# Patient Record
Sex: Male | Born: 1935 | Race: White | Hispanic: No | Marital: Married | Smoking: Never smoker
Health system: Southern US, Community
[De-identification: ages and names within clinical notes are randomized; demographics above are authoritative.]

## PROBLEM LIST (undated history)

## (undated) DIAGNOSIS — A770 Spotted fever due to Rickettsia rickettsii: Secondary | ICD-10-CM

## (undated) DIAGNOSIS — I472 Ventricular tachycardia: Secondary | ICD-10-CM

## (undated) DIAGNOSIS — G2581 Restless legs syndrome: Secondary | ICD-10-CM

## (undated) DIAGNOSIS — M109 Gout, unspecified: Secondary | ICD-10-CM

## (undated) DIAGNOSIS — G8929 Other chronic pain: Secondary | ICD-10-CM

## (undated) DIAGNOSIS — K449 Diaphragmatic hernia without obstruction or gangrene: Secondary | ICD-10-CM

## (undated) DIAGNOSIS — I10 Essential (primary) hypertension: Secondary | ICD-10-CM

## (undated) DIAGNOSIS — Z950 Presence of cardiac pacemaker: Secondary | ICD-10-CM

## (undated) DIAGNOSIS — R339 Retention of urine, unspecified: Secondary | ICD-10-CM

## (undated) DIAGNOSIS — R0600 Dyspnea, unspecified: Secondary | ICD-10-CM

## (undated) DIAGNOSIS — M1990 Unspecified osteoarthritis, unspecified site: Secondary | ICD-10-CM

## (undated) DIAGNOSIS — K219 Gastro-esophageal reflux disease without esophagitis: Secondary | ICD-10-CM

## (undated) DIAGNOSIS — I219 Acute myocardial infarction, unspecified: Secondary | ICD-10-CM

## (undated) DIAGNOSIS — R001 Bradycardia, unspecified: Secondary | ICD-10-CM

## (undated) DIAGNOSIS — I6340 Cerebral infarction due to embolism of unspecified cerebral artery: Secondary | ICD-10-CM

## (undated) DIAGNOSIS — I5040 Unspecified combined systolic (congestive) and diastolic (congestive) heart failure: Secondary | ICD-10-CM

## (undated) DIAGNOSIS — Z951 Presence of aortocoronary bypass graft: Secondary | ICD-10-CM

## (undated) DIAGNOSIS — E039 Hypothyroidism, unspecified: Secondary | ICD-10-CM

## (undated) DIAGNOSIS — I350 Nonrheumatic aortic (valve) stenosis: Secondary | ICD-10-CM

## (undated) DIAGNOSIS — R29898 Other symptoms and signs involving the musculoskeletal system: Secondary | ICD-10-CM

## (undated) DIAGNOSIS — J101 Influenza due to other identified influenza virus with other respiratory manifestations: Secondary | ICD-10-CM

## (undated) DIAGNOSIS — C4359 Malignant melanoma of other part of trunk: Secondary | ICD-10-CM

## (undated) DIAGNOSIS — R0602 Shortness of breath: Secondary | ICD-10-CM

## (undated) DIAGNOSIS — I4510 Unspecified right bundle-branch block: Secondary | ICD-10-CM

## (undated) DIAGNOSIS — M810 Age-related osteoporosis without current pathological fracture: Secondary | ICD-10-CM

## (undated) DIAGNOSIS — I495 Sick sinus syndrome: Secondary | ICD-10-CM

## (undated) DIAGNOSIS — M25569 Pain in unspecified knee: Secondary | ICD-10-CM

## (undated) DIAGNOSIS — I255 Ischemic cardiomyopathy: Secondary | ICD-10-CM

## (undated) DIAGNOSIS — R011 Cardiac murmur, unspecified: Secondary | ICD-10-CM

## (undated) DIAGNOSIS — E785 Hyperlipidemia, unspecified: Secondary | ICD-10-CM

## (undated) DIAGNOSIS — R42 Dizziness and giddiness: Secondary | ICD-10-CM

## (undated) DIAGNOSIS — F418 Other specified anxiety disorders: Secondary | ICD-10-CM

## (undated) DIAGNOSIS — G4700 Insomnia, unspecified: Secondary | ICD-10-CM

## (undated) DIAGNOSIS — I2510 Atherosclerotic heart disease of native coronary artery without angina pectoris: Secondary | ICD-10-CM

## (undated) DIAGNOSIS — I509 Heart failure, unspecified: Secondary | ICD-10-CM

## (undated) DIAGNOSIS — I951 Orthostatic hypotension: Secondary | ICD-10-CM

## (undated) HISTORY — DX: Cerebral infarction due to embolism of unspecified cerebral artery: I63.40

## (undated) HISTORY — DX: Sick sinus syndrome: I49.5

## (undated) HISTORY — DX: Retention of urine, unspecified: R33.9

## (undated) HISTORY — DX: Other symptoms and signs involving the musculoskeletal system: R29.898

## (undated) HISTORY — DX: Influenza due to other identified influenza virus with other respiratory manifestations: J10.1

## (undated) HISTORY — DX: Gout, unspecified: M10.9

## (undated) HISTORY — DX: Other chronic pain: G89.29

## (undated) HISTORY — DX: Nonrheumatic aortic (valve) stenosis: I35.0

## (undated) HISTORY — DX: Restless legs syndrome: G25.81

## (undated) HISTORY — DX: Pain in unspecified knee: M25.569

## (undated) HISTORY — DX: Dizziness and giddiness: R42

## (undated) HISTORY — DX: Orthostatic hypotension: I95.1

## (undated) HISTORY — PX: MELANOMA EXCISION: SHX5266

## (undated) HISTORY — DX: Unspecified right bundle-branch block: I45.10

## (undated) HISTORY — DX: Unspecified combined systolic (congestive) and diastolic (congestive) heart failure: I50.40

## (undated) HISTORY — DX: Ventricular tachycardia: I47.2

## (undated) HISTORY — DX: Essential (primary) hypertension: I10

## (undated) HISTORY — DX: Other specified anxiety disorders: F41.8

## (undated) HISTORY — DX: Age-related osteoporosis without current pathological fracture: M81.0

## (undated) HISTORY — DX: Dyspnea, unspecified: R06.00

## (undated) HISTORY — DX: Ischemic cardiomyopathy: I25.5

## (undated) HISTORY — DX: Spotted fever due to Rickettsia rickettsii: A77.0

## (undated) HISTORY — DX: Heart failure, unspecified: I50.9

---
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
## 1974-02-04 DIAGNOSIS — C4359 Malignant melanoma of other part of trunk: Secondary | ICD-10-CM

## 1974-02-04 HISTORY — DX: Malignant melanoma of other part of trunk: C43.59

## 1984-02-05 HISTORY — PX: TRANSURETHRAL RESECTION OF PROSTATE: SHX73

## 1994-02-04 HISTORY — PX: CORONARY ARTERY BYPASS GRAFT: SHX141

## 1997-10-04 ENCOUNTER — Ambulatory Visit (HOSPITAL_COMMUNITY): Admission: RE | Admit: 1997-10-04 | Discharge: 1997-10-04 | Payer: Self-pay | Admitting: Gastroenterology

## 1998-04-03 ENCOUNTER — Ambulatory Visit (HOSPITAL_COMMUNITY): Admission: RE | Admit: 1998-04-03 | Discharge: 1998-04-03 | Payer: Self-pay | Admitting: Cardiology

## 1999-10-31 ENCOUNTER — Encounter: Payer: Self-pay | Admitting: Internal Medicine

## 1999-10-31 ENCOUNTER — Ambulatory Visit (HOSPITAL_COMMUNITY): Admission: RE | Admit: 1999-10-31 | Discharge: 1999-10-31 | Payer: Self-pay | Admitting: Internal Medicine

## 2001-04-06 ENCOUNTER — Encounter (INDEPENDENT_AMBULATORY_CARE_PROVIDER_SITE_OTHER): Payer: Self-pay | Admitting: *Deleted

## 2001-04-06 ENCOUNTER — Ambulatory Visit (HOSPITAL_BASED_OUTPATIENT_CLINIC_OR_DEPARTMENT_OTHER): Admission: RE | Admit: 2001-04-06 | Discharge: 2001-04-06 | Payer: Self-pay | Admitting: Plastic Surgery

## 2001-07-12 ENCOUNTER — Inpatient Hospital Stay (HOSPITAL_COMMUNITY): Admission: EM | Admit: 2001-07-12 | Discharge: 2001-07-15 | Payer: Self-pay | Admitting: *Deleted

## 2001-07-13 ENCOUNTER — Encounter (HOSPITAL_BASED_OUTPATIENT_CLINIC_OR_DEPARTMENT_OTHER): Payer: Self-pay | Admitting: Internal Medicine

## 2001-07-14 ENCOUNTER — Encounter: Payer: Self-pay | Admitting: Ophthalmology

## 2001-07-14 ENCOUNTER — Encounter: Payer: Self-pay | Admitting: Cardiology

## 2001-11-20 ENCOUNTER — Ambulatory Visit (HOSPITAL_COMMUNITY): Admission: RE | Admit: 2001-11-20 | Discharge: 2001-11-20 | Payer: Self-pay | Admitting: *Deleted

## 2002-07-28 ENCOUNTER — Encounter: Admission: RE | Admit: 2002-07-28 | Discharge: 2002-07-28 | Payer: Self-pay | Admitting: Orthopedic Surgery

## 2002-07-28 ENCOUNTER — Encounter: Payer: Self-pay | Admitting: Orthopedic Surgery

## 2002-07-29 ENCOUNTER — Encounter (INDEPENDENT_AMBULATORY_CARE_PROVIDER_SITE_OTHER): Payer: Self-pay | Admitting: *Deleted

## 2002-07-29 ENCOUNTER — Ambulatory Visit (HOSPITAL_BASED_OUTPATIENT_CLINIC_OR_DEPARTMENT_OTHER): Admission: RE | Admit: 2002-07-29 | Discharge: 2002-07-29 | Payer: Self-pay | Admitting: Orthopedic Surgery

## 2003-10-21 ENCOUNTER — Emergency Department (HOSPITAL_COMMUNITY): Admission: EM | Admit: 2003-10-21 | Discharge: 2003-10-21 | Payer: Self-pay | Admitting: Emergency Medicine

## 2003-11-02 ENCOUNTER — Encounter: Admission: RE | Admit: 2003-11-02 | Discharge: 2003-11-02 | Payer: Self-pay | Admitting: Internal Medicine

## 2004-01-06 ENCOUNTER — Encounter: Admission: RE | Admit: 2004-01-06 | Discharge: 2004-01-06 | Payer: Self-pay | Admitting: Surgery

## 2004-06-15 ENCOUNTER — Encounter: Admission: RE | Admit: 2004-06-15 | Discharge: 2004-06-15 | Payer: Self-pay | Admitting: Cardiology

## 2004-10-25 ENCOUNTER — Encounter: Admission: RE | Admit: 2004-10-25 | Discharge: 2004-10-25 | Payer: Self-pay | Admitting: Internal Medicine

## 2005-10-08 ENCOUNTER — Encounter: Admission: RE | Admit: 2005-10-08 | Discharge: 2005-11-29 | Payer: Self-pay | Admitting: Internal Medicine

## 2006-07-26 ENCOUNTER — Encounter: Admission: RE | Admit: 2006-07-26 | Discharge: 2006-07-26 | Payer: Self-pay | Admitting: Internal Medicine

## 2007-03-27 ENCOUNTER — Ambulatory Visit (HOSPITAL_COMMUNITY): Admission: RE | Admit: 2007-03-27 | Discharge: 2007-03-27 | Payer: Self-pay | Admitting: *Deleted

## 2007-12-25 ENCOUNTER — Ambulatory Visit (HOSPITAL_COMMUNITY): Admission: RE | Admit: 2007-12-25 | Discharge: 2007-12-25 | Payer: Self-pay | Admitting: *Deleted

## 2008-02-05 HISTORY — PX: INSERT / REPLACE / REMOVE PACEMAKER: SUR710

## 2008-04-04 ENCOUNTER — Encounter: Admission: RE | Admit: 2008-04-04 | Discharge: 2008-04-04 | Payer: Self-pay | Admitting: Internal Medicine

## 2008-04-12 ENCOUNTER — Inpatient Hospital Stay (HOSPITAL_COMMUNITY): Admission: AD | Admit: 2008-04-12 | Discharge: 2008-04-13 | Payer: Self-pay | Admitting: Cardiology

## 2009-07-05 HISTORY — PX: CORONARY ANGIOPLASTY WITH STENT PLACEMENT: SHX49

## 2009-07-10 ENCOUNTER — Encounter: Payer: Self-pay | Admitting: Emergency Medicine

## 2009-07-10 ENCOUNTER — Inpatient Hospital Stay (HOSPITAL_COMMUNITY): Admission: EM | Admit: 2009-07-10 | Discharge: 2009-07-13 | Payer: Self-pay | Admitting: Cardiovascular Disease

## 2009-07-11 HISTORY — PX: US ECHOCARDIOGRAPHY: HXRAD669

## 2009-09-07 ENCOUNTER — Encounter (HOSPITAL_COMMUNITY): Admission: RE | Admit: 2009-09-07 | Discharge: 2009-11-03 | Payer: Self-pay | Admitting: Cardiology

## 2009-09-19 ENCOUNTER — Inpatient Hospital Stay (HOSPITAL_COMMUNITY): Admission: EM | Admit: 2009-09-19 | Discharge: 2009-09-20 | Payer: Self-pay | Admitting: Emergency Medicine

## 2010-02-04 HISTORY — PX: CATARACT EXTRACTION W/ INTRAOCULAR LENS  IMPLANT, BILATERAL: SHX1307

## 2010-02-25 ENCOUNTER — Encounter: Payer: Self-pay | Admitting: Cardiology

## 2010-04-05 ENCOUNTER — Other Ambulatory Visit: Payer: Self-pay | Admitting: Cardiology

## 2010-04-05 ENCOUNTER — Ambulatory Visit
Admission: RE | Admit: 2010-04-05 | Discharge: 2010-04-05 | Disposition: A | Discharge status: Home / Self Care | Payer: Medicare Other | Source: Ambulatory Visit | Attending: Cardiology | Admitting: Cardiology

## 2010-04-05 DIAGNOSIS — R42 Dizziness and giddiness: Secondary | ICD-10-CM

## 2010-04-05 HISTORY — PX: CARDIAC CATHETERIZATION: SHX172

## 2010-04-12 ENCOUNTER — Ambulatory Visit (HOSPITAL_COMMUNITY)
Admission: RE | Admit: 2010-04-12 | Discharge: 2010-04-12 | Disposition: A | Discharge status: Home / Self Care | Payer: Medicare Other | Source: Ambulatory Visit | Attending: Cardiology | Admitting: Cardiology

## 2010-04-12 DIAGNOSIS — Z950 Presence of cardiac pacemaker: Secondary | ICD-10-CM | POA: Insufficient documentation

## 2010-04-12 DIAGNOSIS — I2510 Atherosclerotic heart disease of native coronary artery without angina pectoris: Secondary | ICD-10-CM | POA: Insufficient documentation

## 2010-04-12 DIAGNOSIS — I25810 Atherosclerosis of coronary artery bypass graft(s) without angina pectoris: Secondary | ICD-10-CM | POA: Insufficient documentation

## 2010-04-12 DIAGNOSIS — Z7902 Long term (current) use of antithrombotics/antiplatelets: Secondary | ICD-10-CM | POA: Insufficient documentation

## 2010-04-12 DIAGNOSIS — E785 Hyperlipidemia, unspecified: Secondary | ICD-10-CM | POA: Insufficient documentation

## 2010-04-12 DIAGNOSIS — I252 Old myocardial infarction: Secondary | ICD-10-CM | POA: Insufficient documentation

## 2010-04-12 DIAGNOSIS — Z7982 Long term (current) use of aspirin: Secondary | ICD-10-CM | POA: Insufficient documentation

## 2010-04-12 LAB — POCT ACTIVATED CLOTTING TIME: Activated Clotting Time: 152 seconds

## 2010-04-13 LAB — POCT ACTIVATED CLOTTING TIME: Activated Clotting Time: 152 seconds

## 2010-04-17 NOTE — Procedures (Signed)
Adam [name], Adam [name]                 ACCOUNT NO.:  [id number]  MEDICAL RECORD NO.:  [id number]           PATIENT TYPE:  O  LOCATION:  [facility]                         FACILITY:  [facility]  PHYSICIAN:  Ritta Slot, MD     DATE OF BIRTH:  [birth year]  DATE OF PROCEDURE: DATE OF DISCHARGE:  04/12/2010                           CARDIAC CATHETERIZATION   PROCEDURE:  Left heart catheterization.  This is a very pleasant 77 year old gentleman who has a history of CAD status post coronary bypass grafting in 1996 with LIMA to LAD, SVG to the RCA, and SVG to OM.  He had bilateral stenting with a 3.5 x 28 mm stent to the SVG and to the RCA in June 2011.  He remained well from cardiovascular standpoint, although he was having symptoms of dizziness and chest discomfort.  He was therefore brought to [facility] for further evaluation of coronary artery disease and progression of his CAD.  After informed consent, he was placed in supine position.  His left wrist was prepped and draped in usual sterile fashion, as was his right groin.  Through the left wrist, using 1 mL of lidocaine, a 5-French sheath was placed in left radial artery using modified Seldinger technique without difficulty.  The JR-4 catheter was then used over an exchange wire to try to engage into the native right coronary which was unsuccessful.  We then went ahead and exchanged from multipurpose which was unsuccessful and then we tried RCB catheter which was unsuccessful and then the no-torque which was able to engage into the native right without difficulty.  The native was exchanged over J-wire, an exchange wire for the LIMA catheter, LIMA catheter what then exchanged into LIMA without difficulty.  The LIMA catheter was then exchanged over exchange wire for the JL-4 catheter.  The JL-4 catheter was used to engage the left main without difficulty.  An attempt was then made once again with a no-torque  engaged into the SVG.  Venous bypass grafts was unsuccessful.  An attempt through the left radial artery was then abandoned and then attention was turned to the right femoral artery.  After 10 mL of 1% lidocaine, we infiltrated the right groin using modified Seldinger technique, a 5-French sheath was placed into the right femoral artery without difficulty.  The JR-4 catheter was then passed through the 5-French sheath into the ostium of the SVG to the RCA and OM without difficulty.  Visualization of all grafts was complete.  CONCLUSION AND FINDINGS: 1. AO pressure 97/56. 2. Coronary.  The native right coronary was 100% occluded in its     origin and in its proximal portion.  The left main bifurcated into     LAD and circumflex.  Left main was patent.  The left circumflex was     100% occluded in its origin and in its proximal portion as was the     LAD.  Bypass graft to the LIMA, to the LAD was patent.  There was     retrograde flowing from the distal LAD to proximal LAD through the  LIMA without difficulty.  There is no significant disease.  The SVG     to the RCA was patent and mildly distended in its midportion of the     body was patent.  It caused retrograde filling of the native RCA     branch with proximal portion above the 50% stenosis of the native     artery prior to the bifurcation of PLA and PDA.  Saphenous vein     graft to OM was patent.  The OM itself was patent all the way     through.  There was no significant disease.  FINAL CONCLUSION:  There is patent LIMA to LAD, patent SVG to OM, patent SVG to RCA, native vessel 100% occluded, LAD 100% occluded, circumflex 100% occluded, and 50% distal RCA.  PLAN:  Medical therapy.  There is nothing to intervene upon.  He has low risk coronary anatomy and he will otherwise do well.     Ritta Slot, MD     HS/MEDQ  D:  04/12/2010  T:  04/13/2010  Job:  [id number]  Electronically Signed by Ritta Slot MD on  04/17/2010 01:07:24 PM

## 2010-04-19 LAB — PROTIME-INR
INR: 0.94 (ref 0.00–1.49)
Prothrombin Time: 12.8 seconds (ref 11.6–15.2)

## 2010-04-19 LAB — CBC
HCT: 37.3 % — ABNORMAL LOW (ref 39.0–52.0)
HCT: 37.6 % — ABNORMAL LOW (ref 39.0–52.0)
HCT: 38 % — ABNORMAL LOW (ref 39.0–52.0)
HCT: 39.3 % (ref 39.0–52.0)
Hemoglobin: 11.9 g/dL — ABNORMAL LOW (ref 13.0–17.0)
Hemoglobin: 12.5 g/dL — ABNORMAL LOW (ref 13.0–17.0)
Hemoglobin: 12.6 g/dL — ABNORMAL LOW (ref 13.0–17.0)
Hemoglobin: 12.9 g/dL — ABNORMAL LOW (ref 13.0–17.0)
MCH: 30.3 pg (ref 26.0–34.0)
MCH: 31.2 pg (ref 26.0–34.0)
MCH: 31.4 pg (ref 26.0–34.0)
MCH: 31.5 pg (ref 26.0–34.0)
MCHC: 31.9 g/dL (ref 30.0–36.0)
MCHC: 32.8 g/dL (ref 30.0–36.0)
MCHC: 33.2 g/dL (ref 30.0–36.0)
MCHC: 33.2 g/dL (ref 30.0–36.0)
MCV: 94.5 fL (ref 78.0–100.0)
MCV: 94.9 fL (ref 78.0–100.0)
MCV: 94.9 fL (ref 78.0–100.0)
MCV: 95 fL (ref 78.0–100.0)
Platelets: 182 10*3/uL (ref 150–400)
Platelets: 186 10*3/uL (ref 150–400)
Platelets: 193 10*3/uL (ref 150–400)
Platelets: 197 10*3/uL (ref 150–400)
RBC: 3.93 MIL/uL — ABNORMAL LOW (ref 4.22–5.81)
RBC: 3.98 MIL/uL — ABNORMAL LOW (ref 4.22–5.81)
RBC: 4 MIL/uL — ABNORMAL LOW (ref 4.22–5.81)
RBC: 4.14 MIL/uL — ABNORMAL LOW (ref 4.22–5.81)
RDW: 13.7 % (ref 11.5–15.5)
RDW: 13.7 % (ref 11.5–15.5)
RDW: 13.8 % (ref 11.5–15.5)
RDW: 13.9 % (ref 11.5–15.5)
WBC: 5.6 10*3/uL (ref 4.0–10.5)
WBC: 6 10*3/uL (ref 4.0–10.5)
WBC: 6.3 10*3/uL (ref 4.0–10.5)
WBC: 6.6 10*3/uL (ref 4.0–10.5)

## 2010-04-19 LAB — LIPID PANEL
Cholesterol: 93 mg/dL (ref 0–200)
HDL: 34 mg/dL — ABNORMAL LOW (ref >39)
LDL Cholesterol: 39 mg/dL (ref 0–99)
Total CHOL/HDL Ratio: 2.7 RATIO
Triglycerides: 98 mg/dL (ref <150)
VLDL: 20 mg/dL (ref 0–40)

## 2010-04-19 LAB — APTT: aPTT: 29 seconds (ref 24–37)

## 2010-04-19 LAB — BASIC METABOLIC PANEL
BUN: 12 mg/dL (ref 6–23)
CO2: 24 mEq/L (ref 19–32)
Calcium: 8.7 mg/dL (ref 8.4–10.5)
Chloride: 109 mEq/L (ref 96–112)
Creatinine, Ser: 0.87 mg/dL (ref 0.4–1.5)
GFR calc Af Amer: >60 mL/min (ref >60)
GFR calc non Af Amer: >60 mL/min (ref >60)
Glucose, Bld: 101 mg/dL — ABNORMAL HIGH (ref 70–99)
Potassium: 4 mEq/L (ref 3.5–5.1)
Sodium: 139 mEq/L (ref 135–145)

## 2010-04-19 LAB — DIFFERENTIAL
Basophils Absolute: 0 10*3/uL (ref 0.0–0.1)
Basophils Relative: 0 % (ref 0–1)
Eosinophils Absolute: 0.2 10*3/uL (ref 0.0–0.7)
Eosinophils Relative: 3 % (ref 0–5)
Lymphocytes Relative: 20 % (ref 12–46)
Lymphs Abs: 1.3 10*3/uL (ref 0.7–4.0)
Monocytes Absolute: 0.8 10*3/uL (ref 0.1–1.0)
Monocytes Relative: 12 % (ref 3–12)
Neutro Abs: 4.1 10*3/uL (ref 1.7–7.7)
Neutrophils Relative %: 65 % (ref 43–77)

## 2010-04-19 LAB — POCT I-STAT, CHEM 8
BUN: 18 mg/dL (ref 6–23)
Calcium, Ion: 1.09 mmol/L — ABNORMAL LOW (ref 1.12–1.32)
Chloride: 109 mEq/L (ref 96–112)
Creatinine, Ser: 0.9 mg/dL (ref 0.4–1.5)
Glucose, Bld: 98 mg/dL (ref 70–99)
HCT: 38 % — ABNORMAL LOW (ref 39.0–52.0)
Hemoglobin: 12.9 g/dL — ABNORMAL LOW (ref 13.0–17.0)
Potassium: 4.2 mEq/L (ref 3.5–5.1)
Sodium: 140 mEq/L (ref 135–145)
TCO2: 22 mmol/L (ref 0–100)

## 2010-04-19 LAB — COMPREHENSIVE METABOLIC PANEL
ALT: 20 U/L (ref 0–53)
AST: 25 U/L (ref 0–37)
Albumin: 3.3 g/dL — ABNORMAL LOW (ref 3.5–5.2)
Alkaline Phosphatase: 54 U/L (ref 39–117)
BUN: 15 mg/dL (ref 6–23)
CO2: 26 mEq/L (ref 19–32)
Calcium: 8.8 mg/dL (ref 8.4–10.5)
Chloride: 109 mEq/L (ref 96–112)
Creatinine, Ser: 0.94 mg/dL (ref 0.4–1.5)
GFR calc Af Amer: >60 mL/min (ref >60)
GFR calc non Af Amer: >60 mL/min (ref >60)
Glucose, Bld: 96 mg/dL (ref 70–99)
Potassium: 3.8 mEq/L (ref 3.5–5.1)
Sodium: 143 mEq/L (ref 135–145)
Total Bilirubin: 0.3 mg/dL (ref 0.3–1.2)
Total Protein: 5.9 g/dL — ABNORMAL LOW (ref 6.0–8.3)

## 2010-04-19 LAB — POCT CARDIAC MARKERS
CKMB, poc: 1.7 ng/mL (ref 1.0–8.0)
Myoglobin, poc: 54 ng/mL (ref 12–200)
Troponin i, poc: <0.05 ng/mL (ref 0.00–0.09)

## 2010-04-19 LAB — HEPARIN LEVEL (UNFRACTIONATED)
Heparin Unfractionated: 0.21 IU/mL — ABNORMAL LOW (ref 0.30–0.70)
Heparin Unfractionated: <0.1 IU/mL — ABNORMAL LOW (ref 0.30–0.70)

## 2010-04-19 LAB — CK TOTAL AND CKMB (NOT AT ARMC)
CK, MB: 3.4 ng/mL (ref 0.3–4.0)
CK, MB: 3.6 ng/mL (ref 0.3–4.0)
Relative Index: 1.9 (ref 0.0–2.5)
Relative Index: 2.1 (ref 0.0–2.5)
Total CK: 159 U/L (ref 7–232)
Total CK: 189 U/L (ref 7–232)

## 2010-04-19 LAB — MAGNESIUM: Magnesium: 2.5 mg/dL (ref 1.5–2.5)

## 2010-04-19 LAB — TROPONIN I
Troponin I: 0.01 ng/mL (ref 0.00–0.06)
Troponin I: 0.02 ng/mL (ref 0.00–0.06)

## 2010-04-19 LAB — HEMOGLOBIN A1C
Hgb A1c MFr Bld: 5.7 % — ABNORMAL HIGH (ref <5.7)
Mean Plasma Glucose: 117 mg/dL — ABNORMAL HIGH (ref <117)

## 2010-04-19 LAB — TSH: TSH: 2.991 u[IU]/mL (ref 0.350–4.500)

## 2010-04-23 LAB — BRAIN NATRIURETIC PEPTIDE
Pro B Natriuretic peptide (BNP): 144 pg/mL — ABNORMAL HIGH (ref 0.0–100.0)
Pro B Natriuretic peptide (BNP): 145 pg/mL — ABNORMAL HIGH (ref 0.0–100.0)

## 2010-04-23 LAB — APTT
aPTT: 77 seconds — ABNORMAL HIGH (ref 24–37)
aPTT: 33 seconds (ref 24–37)

## 2010-04-23 LAB — DIFFERENTIAL
Basophils Absolute: 0 10*3/uL (ref 0.0–0.1)
Basophils Relative: 0 % (ref 0–1)
Eosinophils Absolute: 0.2 10*3/uL (ref 0.0–0.7)
Eosinophils Relative: 3 % (ref 0–5)
Lymphocytes Relative: 23 % (ref 12–46)
Lymphs Abs: 1.7 10*3/uL (ref 0.7–4.0)
Monocytes Absolute: 0.6 10*3/uL (ref 0.1–1.0)
Monocytes Relative: 8 % (ref 3–12)
Neutro Abs: 5 10*3/uL (ref 1.7–7.7)
Neutrophils Relative %: 66 % (ref 43–77)

## 2010-04-23 LAB — BASIC METABOLIC PANEL
BUN: 10 mg/dL (ref 6–23)
BUN: 11 mg/dL (ref 6–23)
BUN: 15 mg/dL (ref 6–23)
BUN: 19 mg/dL (ref 6–23)
CO2: 25 mEq/L (ref 19–32)
CO2: 27 mEq/L (ref 19–32)
CO2: 28 mEq/L (ref 19–32)
CO2: 25 mEq/L (ref 19–32)
Calcium: 8.2 mg/dL — ABNORMAL LOW (ref 8.4–10.5)
Calcium: 8.2 mg/dL — ABNORMAL LOW (ref 8.4–10.5)
Calcium: 8.7 mg/dL (ref 8.4–10.5)
Calcium: 8.5 mg/dL (ref 8.4–10.5)
Chloride: 108 mEq/L (ref 96–112)
Chloride: 109 mEq/L (ref 96–112)
Chloride: 110 mEq/L (ref 96–112)
Chloride: 105 mEq/L (ref 96–112)
Creatinine, Ser: 0.81 mg/dL (ref 0.4–1.5)
Creatinine, Ser: 0.83 mg/dL (ref 0.4–1.5)
Creatinine, Ser: 0.85 mg/dL (ref 0.4–1.5)
Creatinine, Ser: 1.04 mg/dL (ref 0.4–1.5)
GFR calc Af Amer: >60 mL/min (ref >60)
GFR calc Af Amer: >60 mL/min (ref >60)
GFR calc Af Amer: >60 mL/min (ref >60)
GFR calc Af Amer: >60 mL/min (ref >60)
GFR calc non Af Amer: >60 mL/min (ref >60)
GFR calc non Af Amer: >60 mL/min (ref >60)
GFR calc non Af Amer: >60 mL/min (ref >60)
GFR calc non Af Amer: >60 mL/min (ref >60)
Glucose, Bld: 91 mg/dL (ref 70–99)
Glucose, Bld: 92 mg/dL (ref 70–99)
Glucose, Bld: 99 mg/dL (ref 70–99)
Glucose, Bld: 116 mg/dL — ABNORMAL HIGH (ref 70–99)
Potassium: 3.9 mEq/L (ref 3.5–5.1)
Potassium: 4 mEq/L (ref 3.5–5.1)
Potassium: 4.4 mEq/L (ref 3.5–5.1)
Potassium: 4.4 mEq/L (ref 3.5–5.1)
Sodium: 139 mEq/L (ref 135–145)
Sodium: 139 mEq/L (ref 135–145)
Sodium: 140 mEq/L (ref 135–145)
Sodium: 137 mEq/L (ref 135–145)

## 2010-04-23 LAB — CARDIAC PANEL(CRET KIN+CKTOT+MB+TROPI)
CK, MB: 3.1 ng/mL (ref 0.3–4.0)
CK, MB: 3.1 ng/mL (ref 0.3–4.0)
CK, MB: 3.7 ng/mL (ref 0.3–4.0)
CK, MB: 4 ng/mL (ref 0.3–4.0)
CK, MB: 4.2 ng/mL — ABNORMAL HIGH (ref 0.3–4.0)
CK, MB: 5.9 ng/mL — ABNORMAL HIGH (ref 0.3–4.0)
CK, MB: 7.5 ng/mL (ref 0.3–4.0)
Relative Index: 2 (ref 0.0–2.5)
Relative Index: 2 (ref 0.0–2.5)
Relative Index: 2.1 (ref 0.0–2.5)
Relative Index: 2.2 (ref 0.0–2.5)
Relative Index: 2.7 — ABNORMAL HIGH (ref 0.0–2.5)
Relative Index: 3 — ABNORMAL HIGH (ref 0.0–2.5)
Relative Index: 3.9 — ABNORMAL HIGH (ref 0.0–2.5)
Total CK: 153 U/L (ref 7–232)
Total CK: 153 U/L (ref 7–232)
Total CK: 158 U/L (ref 7–232)
Total CK: 175 U/L (ref 7–232)
Total CK: 178 U/L (ref 7–232)
Total CK: 193 U/L (ref 7–232)
Total CK: 197 U/L (ref 7–232)
Troponin I: 0.09 ng/mL — ABNORMAL HIGH (ref 0.00–0.06)
Troponin I: 0.11 ng/mL — ABNORMAL HIGH (ref 0.00–0.06)
Troponin I: 0.14 ng/mL — ABNORMAL HIGH (ref 0.00–0.06)
Troponin I: 0.17 ng/mL — ABNORMAL HIGH (ref 0.00–0.06)
Troponin I: 0.33 ng/mL — ABNORMAL HIGH (ref 0.00–0.06)
Troponin I: 0.48 ng/mL — ABNORMAL HIGH (ref 0.00–0.06)
Troponin I: 0.56 ng/mL (ref 0.00–0.06)

## 2010-04-23 LAB — CBC
HCT: 36.1 % — ABNORMAL LOW (ref 39.0–52.0)
HCT: 36.5 % — ABNORMAL LOW (ref 39.0–52.0)
HCT: 37.1 % — ABNORMAL LOW (ref 39.0–52.0)
HCT: 39 % (ref 39.0–52.0)
Hemoglobin: 12.3 g/dL — ABNORMAL LOW (ref 13.0–17.0)
Hemoglobin: 12.6 g/dL — ABNORMAL LOW (ref 13.0–17.0)
Hemoglobin: 12.7 g/dL — ABNORMAL LOW (ref 13.0–17.0)
Hemoglobin: 13.3 g/dL (ref 13.0–17.0)
MCHC: 33.8 g/dL (ref 30.0–36.0)
MCHC: 34.1 g/dL (ref 30.0–36.0)
MCHC: 34.8 g/dL (ref 30.0–36.0)
MCHC: 34 g/dL (ref 30.0–36.0)
MCV: 97 fL (ref 78.0–100.0)
MCV: 97.3 fL (ref 78.0–100.0)
MCV: 97.4 fL (ref 78.0–100.0)
MCV: 96.8 fL (ref 78.0–100.0)
Platelets: 200 10*3/uL (ref 150–400)
Platelets: 203 10*3/uL (ref 150–400)
Platelets: 210 10*3/uL (ref 150–400)
Platelets: 242 10*3/uL (ref 150–400)
RBC: 3.73 MIL/uL — ABNORMAL LOW (ref 4.22–5.81)
RBC: 3.76 MIL/uL — ABNORMAL LOW (ref 4.22–5.81)
RBC: 3.81 MIL/uL — ABNORMAL LOW (ref 4.22–5.81)
RBC: 4.03 MIL/uL — ABNORMAL LOW (ref 4.22–5.81)
RDW: 14.3 % (ref 11.5–15.5)
RDW: 14.5 % (ref 11.5–15.5)
RDW: 14.5 % (ref 11.5–15.5)
RDW: 14.1 % (ref 11.5–15.5)
WBC: 6.3 10*3/uL (ref 4.0–10.5)
WBC: 6.7 10*3/uL (ref 4.0–10.5)
WBC: 7.4 10*3/uL (ref 4.0–10.5)
WBC: 7.5 10*3/uL (ref 4.0–10.5)

## 2010-04-23 LAB — PROTIME-INR
INR: 1.02 (ref 0.00–1.49)
INR: 1.07 (ref 0.00–1.49)
Prothrombin Time: 13.3 seconds (ref 11.6–15.2)
Prothrombin Time: 13.8 seconds (ref 11.6–15.2)

## 2010-04-23 LAB — TSH: TSH: 2.541 u[IU]/mL (ref 0.350–4.500)

## 2010-04-23 LAB — TROPONIN I: Troponin I: 0.15 ng/mL — ABNORMAL HIGH (ref 0.00–0.06)

## 2010-04-23 LAB — CK TOTAL AND CKMB (NOT AT ARMC)
CK, MB: 4.6 ng/mL — ABNORMAL HIGH (ref 0.3–4.0)
Relative Index: 2.1 (ref 0.0–2.5)
Total CK: 214 U/L (ref 7–232)

## 2010-04-23 LAB — MRSA PCR SCREENING: MRSA by PCR: NEGATIVE

## 2010-04-23 LAB — LIPID PANEL
Cholesterol: 118 mg/dL (ref 0–200)
HDL: 35 mg/dL — ABNORMAL LOW (ref >39)
LDL Cholesterol: 49 mg/dL (ref 0–99)
Total CHOL/HDL Ratio: 3.4 RATIO
Triglycerides: 172 mg/dL — ABNORMAL HIGH (ref <150)
VLDL: 34 mg/dL (ref 0–40)

## 2010-04-23 LAB — POCT I-STAT, CHEM 8
BUN: 22 mg/dL (ref 6–23)
Calcium, Ion: 1.08 mmol/L — ABNORMAL LOW (ref 1.12–1.32)
Chloride: 106 mEq/L (ref 96–112)
Creatinine, Ser: 1 mg/dL (ref 0.4–1.5)
Glucose, Bld: 117 mg/dL — ABNORMAL HIGH (ref 70–99)
HCT: 40 % (ref 39.0–52.0)
Hemoglobin: 13.6 g/dL (ref 13.0–17.0)
Potassium: 4.3 mEq/L (ref 3.5–5.1)
Sodium: 139 mEq/L (ref 135–145)
TCO2: 26 mmol/L (ref 0–100)

## 2010-04-23 LAB — HEMOGLOBIN A1C
Hgb A1c MFr Bld: 6.1 % — ABNORMAL HIGH (ref <5.7)
Mean Plasma Glucose: 128 mg/dL — ABNORMAL HIGH (ref <117)

## 2010-04-23 LAB — MAGNESIUM: Magnesium: 2.5 mg/dL (ref 1.5–2.5)

## 2010-04-23 LAB — HEPARIN LEVEL (UNFRACTIONATED): Heparin Unfractionated: 0.21 IU/mL — ABNORMAL LOW (ref 0.30–0.70)

## 2010-04-23 LAB — PLATELET COUNT: Platelets: 218 10*3/uL (ref 150–400)

## 2010-06-19 NOTE — Op Note (Signed)
NAMEHESSTON, HITCHENS                 ACCOUNT NO.:  [id number]   MEDICAL RECORD NO.:  [id number]          PATIENT TYPE:  AMB   LOCATION:  ENDO                         FACILITY:  [facility]   PHYSICIAN:  Georgiana Spinner, M.D.    DATE OF BIRTH:  1926-09-05   DATE OF PROCEDURE:  12/25/2007  DATE OF DISCHARGE:                               OPERATIVE REPORT   OPERATION/PROCEDURE:  Colonoscopy.   INDICATIONS:  Colon cancer screening.   ANESTHESIA:  Fentanyl 130 mcg, Versed 13 mg.   PROCEDURE:  With the patient mildly sedated in the left lateral  decubitus position, subsequently rolled to his back.  The Pentax  videoscopic colonoscope was inserted into the rectum and passed under  direct vision with pressure applied and the patient was noted rolled to  his back, we were able to reach the cecum identified by ileocecal valve  and appendiceal orifice, both of which were photographed.  Prep was  somewhat suboptimal in that there were multiple areas of thickish  brownish liquid stool that was suctionable at least, but there were some  areas where there was solid material that could not be suctioned but we  did best we can removing the endoscope, taking circumferential views of  colonic mucosa, stopping as above only to suction material until we  reached the rectum which appeared normal on direct view and showed  internal hemorrhoids on retroflexed view.  The endoscope was  straightened and withdrawn.  The patient's vital signs and pulse  oximeter remained stable.  The patient tolerated the procedure well  without apparent complications.   FINDINGS:  Internal hemorrhoids; otherwise unremarkable examination.   PLAN:  Consider repeat examination in 5 years.           ______________________________  Georgiana Spinner, M.D.     GMO/MEDQ  D:  12/25/2007  T:  12/25/2007  Job:  [id number]

## 2010-06-19 NOTE — Discharge Summary (Signed)
NAMEJAYMISON, Adam [name]                 ACCOUNT NO.:  [id number]   MEDICAL RECORD NO.:  [id number]          PATIENT TYPE:  OIB   LOCATION:  2501                         FACILITY:  [facility]   PHYSICIAN:  Ritta Slot, MD     DATE OF BIRTH:  1926/09/19   DATE OF ADMISSION:  04/12/2008  DATE OF DISCHARGE:  04/13/2008                               DISCHARGE SUMMARY   DISCHARGE DIAGNOSES:  1. Symptomatic bradycardia, status post pacemaker implant this      admission by Dr. Lynnea Ferrier.  2. Known coronary artery disease with bypass surgery x4 in 1996.  3. Treated dyslipidemia.  4. Treated hypertension.  5. Treated hypothyroidism.   HOSPITAL COURSE:  The patient is a 77 year old male followed by Dr.  Aleen Campi with history of coronary artery disease.  He has been having  weakness, fatigue, and dizziness for the last couple of weeks.  A  monitor on April 07, 2008, showed bradycardia with heart rates in the low  40s.  The patient was set up for an elective pacemaker implant.  This  was done by Dr. Lynnea Ferrier on April 12, 2008.  A [employer] device was  implanted.  The patient tolerated the procedure well.  Plan is for  discharge later today pending a followup chest x-ray.   DISCHARGE MEDICATIONS:  1. Valium 5 mg q.6 p.r.n.  2. Pravachol 20 mg a day.  3. Synthroid 0.01 mg a day.  4. Ambien 5 mg at bedtime p.r.n.  5. Nexium 40 mg a day.  6. Aspirin 81 mg a day.  7. Tylenol p.r.n.  8. Vitamins daily.  9. Metoprolol 25 mg twice a day has been added.   Chest x-ray is pending.  EKG at discharge shows sinus rhythm with right  bundle branch block, pacing on demand.  Preop labs show white count 6.7,  hemoglobin 14.4, hematocrit 41.3, platelets 248.  Sodium 138, potassium  4.3, BUN 20, creatinine 1.0.   DISPOSITION:  The patient is discharged in stable condition.  Chest x-  ray is pending.  He will follow up with Dr. Aleen Campi.      Abelino Derrick, P.A.      Ritta Slot, MD  Electronically  Signed    LKK/MEDQ  D:  04/13/2008  T:  04/13/2008  Job:  [id number]   cc:   Antionette Char, MD

## 2010-06-19 NOTE — Op Note (Signed)
Adam [name], Adam [name]                 ACCOUNT NO.:  [id number]   MEDICAL RECORD NO.:  [id number]          PATIENT TYPE:  AMB   LOCATION:  ENDO                         FACILITY:  [facility]   PHYSICIAN:  Georgiana Spinner, M.D.    DATE OF BIRTH:  29-Sep-1926   DATE OF PROCEDURE:  03/27/2007  DATE OF DISCHARGE:                               OPERATIVE REPORT   PROCEDURE:  Colonoscopy.   INDICATIONS:  Colon cancer screening, colon polyps.   ANESTHESIA:  Demerol 70 mg and Versed 7 mg.   PROCEDURE IN DETAIL:  With the patient mildly sedated in the left  lateral decubitus position, a rectal examination was attempted.  The  prostate, on limited exam, felt normal.  Subsequently, the Pentax  videoscopic colonoscope was inserted in the rectum and passed under  direct vision to just proximal to the splenic flexure.  The prep  throughout was poor in that there were areas of thick brown semi-liquid  stool with solid material that I eventually encountered in the  transverse colon that totally occluded the lumen and I felt it was  unsafe to continue so I decided to withdraw the colonoscope taking  circumferential views of the remaining colonic mucosa, stopping in the  rectum which appeared normal on limited view because of the previously  mentioned fluid stool also was noted there and on retroflexed view, we  saw internal hemorrhoids.  The endoscope was straightened and withdrawn.  The patient's vital signs and pulse oximeter remained stable.  The  patient tolerated the procedure well without apparent complication.   FINDINGS:  Very limited examination, negative except for internal  hemorrhoids, but clearly inadequate prep.   PLAN:  Will plan on doing a repeat examination at some date at [name] convenience.           ______________________________  Georgiana Spinner, M.D.     GMO/MEDQ  D:  03/27/2007  T:  03/27/2007  Job:  [id number]

## 2010-06-22 NOTE — Op Note (Signed)
   NAME:  Adam [name], Adam [name]                           ACCOUNT NO.:  [id number]   MEDICAL RECORD NO.:  [id number]                   PATIENT TYPE:  AMB   LOCATION:  DSC                                  FACILITY:  [facility]   PHYSICIAN:  Cindee Salt, M.D.                    DATE OF BIRTH:  Nov 11, 1926   DATE OF PROCEDURE:  07/29/2002  DATE OF DISCHARGE:                                 OPERATIVE REPORT   PREOPERATIVE DIAGNOSIS:  Mass, right ring finger.   POSTOPERATIVE DIAGNOSIS:  Mass, right ring finger.   OPERATION:  Incision of mass, right ring finger.   SURGEON:  Dr. Merlyn Lot.   ANESTHESIA:  Forearm-based IV regional.   DATE OF OPERATION:  July 29, 2002.   HISTORY:  The patient is a 77 year old male with a history of a mass on the  radial aspect of the right ring finger, proximal phalanx.   PROCEDURE:  The patient was brought to the operating room where a forearm-  based IV regional anesthetic was carried out without difficulty.  He was  prepped using Duraprep in the supine position, right arm free.  A mid  lateral incision was made, carried down through the subcutaneous tissue.  Bleeders were electrocauterized.  The neurovascular structures identified  and protected.  A large, blood-filled, cystic mass was immediately apparent.  With blunt and sharp dissection, this was dissected free.  The origination  was from the flexor sheath, radial aspect; this area was opened, minimally  debrided.  The specimen was sent to pathology.  The wound was irrigated.  The skin was closed with interrupted 5-0 nylon sutures.  A sterile  compressive dressing and a splint were applied.  The patient tolerated the  procedure well.  He is discharged home to return to [facility] in  [place] in one week on Vicodin and Septra DS.                                               Cindee Salt, M.D.    Angelique Blonder  D:  07/29/2002  T:  07/30/2002  Job:  [id number]

## 2010-06-22 NOTE — H&P (Signed)
[facility]  Patient:    Adam [name], Adam [name] Visit Number: [id number] MRN: [id number]          Service Type: MED Location: 3W [phone] Attending Physician:  Terald Sleeper Dictated by:   Terald Sleeper, M.D. Admit Date:  07/12/2001                           History and Physical  IDENTIFICATION:  This is a 77 year old man from home, an outpatient of Dr. Gaspar Skeeters.  CHIEF COMPLAINT:  Abdominal pain.  HISTORY OF PRESENT ILLNESS:  This is a 77 year old man who was well today until he just started eating dinner.  He developed the sudden onset of severe boring periumbilical pain.  The pain was rapidly severe and associated with two episodes of large bilious emesis.  He denies hematemesis.  About the same time, he felt a fluttering in his chest that made him feel dizzy and faint. The pain in his abdomen lasted until he arrived in the ER, and has received morphine.  He was noted by the EDP to have two short bouts of SVT.  One with a heart rate of over 200, and one in the 150s which spontaneously converted.  PAST MEDICAL HISTORY:  This is all via the patient.  I have no records on this gentleman:  1. MI, presenting with silent ischemia with a CABG in 1999, followed by    Dr. Lucas Mallow. 2. Hyperlipidemia. 3. Hypothyroidism. 4. Large hiatal hernia.  MEDICATIONS:  The patient did not bring the actual doses - Pravachol, Lopressor, Synthroid, ASA, iron, Prilosec, and Niaspan.  REVIEW OF SYSTEMS:  CARDIAC:  No complaints of exertional chest pain. However, he does have exertional shortness of breath, although this does not seem to be a progressive problem.  RESPIRATORY:  No cough.  GI:  No nausea or vomiting.  Positive history of constipation.  States last bowel movement was yesterday and no bleeding was noted.  CNS:  Currently feels dizziness, but not true vertigo.  SOCIAL HISTORY:  He lives with his wife.  No smoking and no alcohol.  PHYSICAL  EXAMINATION:  VITAL SIGNS:  Pulse rate is 66, currently sinus.  Cannot at the time of this dictation localize a temperature.  However, he does not feel febrile.  HEENT:  Normal.  RESPIRATORY:  Clear.  HEART:  Sounds S1 is normal, S2 split, and there is a harsh 2/6 systolic murmur without radiation.  No signs of congestive heart failure.  No increase in jugular venous pressure.  ABDOMEN:  Distended.  Bowel sounds are reduced, but present.  He is nontender. 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  No masses.  EXTREMITIES:  No edema.  Peripheral pulses are intact.  CNS:  Exam shows equal strength bilaterally.  He is alert and oriented.  LABORATORY DATA:  To date - white count of 13.6, 86% neutrophils, hemoglobin 16.3, platelets 230,000.  Basic metabolic panel is normal.  Alkaline phosphatase is 77, AST 39, ALP 39, amylase is 92.  Glucose elevated at 157. First cardiac enzymes show a CK Of 317 and an MB of 3.3, and a troponin of 0.03.  Chest x-ray shows a large hiatal hernia, otherwise clear.  Abdominal view is nonobstructive bowel gas patterns.  IMPRESSION: 1. Sudden onset abdominal pain associated with marked vomiting, mildly    elevated white count.  I wonder whether this could be acute cholecystitis.    His initial set  of pancreatic enzymes have been normal.  He has a large    known hiatal hernia.  However, there is insufficient evidence to suspect    that this is a problem at present. 2. Palpitations.  Despite the description in the emergency room, the vent    monitor has listed no supraventricular tachycardia and his first set of    cardiac enzymes were negative.  EKG shows a right bundle branch block    pattern. I do not know whether this is new or old.  However, nothing looks    particularly ischemic at the moment.  PLAN:  A CT scan of the abdomen has been arranged by the EDP and I think this is a logical next step.  At this point in time, I think his cardiac status is stable  and I really do not have a concern that his coronary artery disease is that active.  I do not think he needs empiric antibiotics at this point.  Plan - a CT scan of the abdomen and serial blood work, serial cardiac enzymes, and he will be admitted to a telemetry bed. Dictated by:   Terald Sleeper, M.D. Attending Physician:  Terald Sleeper DD:  07/12/01 TD:  07/14/01 Job: [id number]

## 2010-06-22 NOTE — Op Note (Signed)
[facility]  Patient:    Adam [name], KRONICK Visit Number: [id number] MRN: [id number]          Service Type: DSU Location: [facility] Attending Physician:  Chapman Moss Dictated by:   Teena Irani. Odis Luster, M.D. Proc. 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  POSTOPERATIVE DIAGNOSES: 1. 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 2. Complicated open wound of the forehead secondary to submuscular tumor    excision.  PROCEDURE PERFORMED: 1. Excision of a submuscular lipoma of the forehead, greater than 1.5 cm in    diameter. 2. A complex wound repair of the forehead, greater than 1.0 cm in length    secondary to excision of submuscular lipoma.  SURGEON:  Teena Irani. Odis Luster, M.D.  ANESTHESIA:  One percent Xylocaine with epinephrine plus bicarbonate.  CLINICAL NOTE:  This is a 77 year old man who has a lesion of his forehead that is a soft tissue lesion that is somewhat mobile and has been present for a number of years and has steadily enlarged.  He would like to have it excised.  Clinically, it certainly had every appearance of a lipoma.  The submuscular position of it was discussed with him.  He understood the nature of the procedure and the risks plus complications including scarring and wished to proceed.  DESCRIPTION OF PROCEDURE:  The patient was taken to the operating room and placed supine.  He was prepped with Betadine and draped with sterile drapes. Satisfactory local anesthesia was achieved and the transverse incision in the direction of relaxed skin tension lines.  The dissection was continued down deep to the frontalis muscle where the lipoma was identified and was dissected free from the surrounding tissue and removed as a specimen.  The lesion was clearly well above  the site for the location of the frontal branch of the facial nerve.  The wound was irrigated thoroughly.  Excellent hemostasis was confirmed.  A layered closure was performed using 5-0 Vicryl interrupted and inverted deep sutures for the muscle, 5-0 Vicryl interrupted and inverted subcutaneous sutures, and 6-0 Prolene simple running suture.  Antibiotic ointment and a dry sterile pressure dressing were applied and he tolerated the procedure well.  DISPOSITION:  Tylenol for pain.  No lifting and no exercising for three days. He will call the office for an appointment for early next week.  He will remove the dressing tomorrow and shower. Dictated by:   Teena Irani. Odis Luster, M.D. Attending Physician:  Chapman Moss DD:  04/06/01 TD:  04/07/01 Job: [id number]

## 2010-06-22 NOTE — Discharge Summary (Signed)
[facility]  Patient:    Adam [name], Adam [name] Visit Number: [id number] MRN: [id number]          Service Type: MED Location: 3W [phone] Attending Physician:  Terald Sleeper Dictated by:   Lorin Picket. Claiborne Billings, N.P. Admit Date:  07/12/2001 Discharge Date: 07/15/2001   CC:         Jaclyn Prime. Lucas Mallow, M.D.  Lenon Curt Cassell Clement, M.D.   Discharge Summary  DATE OF BIRTH:  12-29-1926  CHIEF COMPLAINT:  Abdominal pain.  HISTORY OF PRESENT ILLNESS:  This 77 year old male resides at home independently and is followed in primary care by Dr. Murray Hodgkins at [facility].  He felt well until he started eating dinner on the day of admission.  He developed sudden onset of severe, boring, periumbilical area pain.  The pain was rapidly severe and associated with two episodes of large bilious emesis.  He denied hematemesis.  At the same time, he felt a fluttering in his chest that made him feel dizzy and faint.  Pain in his abdomen lasted until he arrived in the emergency room and he received morphine.  He was noted by the EDP to have two short bursts of SVT.  In one incident, heart rate was over 200 and the other in the 150s which spontaneously converted.  PAST MEDICAL HISTORY:  (All obtained via patient with no office records available at time of admission). 1. Previous myocardial infarction presenting with silent ischemia.  CABG in    1999, followed in cardiology by Dr. Lucas Mallow. 2. Hyperlipidemia. 3. Hypothyroidism. 4. Large hiatal hernia.  MEDICATIONS:  See admission History and Physical exam.  REVIEW OF SYSTEMS:  CARDIAC: No complaints of exertional chest pain, however, does have exertional shortness of breath, although this does not seem to be a progressive problem.  RESPIRATORY: No cough.  GI: No nausea or vomiting. Positive history of constipation.  Last bowel movement was the day before admission with no bleeding noted.  NEUROLOGIC:  Currently feels dizziness, but no true vertigo.  SOCIAL HISTORY:  The patient lives with his wife at home independently.  No alcohol or tobacco use.  FAMILY HISTORY:  Not included.  PHYSICAL EXAMINATION:  VITAL SIGNS:  Pulse 66, sinus rhythm.  We could not locate a temperature, however, the patient did not feel febrile.  Respirations and blood pressure not included.  HEENT:  Normal.  LUNGS:  Breath sounds clear.  HEART:  S1 normal.  S2 split.  Harsh 2/6 systolic ejection murmur without radiation.  No signs of congestive heart failure.  No increased jugular venous pressure.  ABDOMEN:  Distended with bowel sounds reduced, but present in all four quadrants.  Nontender to palpation.  No organomegaly.  No palpable masses.  EXTREMITIES:  No edema seen.  Peripheral pulses intact.  Equal strength bilaterally.  NEUROLOGIC:  Alert and oriented.  Cranial nerves II-XII intact.  No focal defects.  ASSESSMENT/PLAN:  The patient was admitted for further evaluation and treatment of the above presentation.  LABORATORY DATA AND X-RAY FINDINGS:  CBC done July 12, 2001, with WBC elevated at 13.6, hemoglobin 16.3, hematocrit 46.7, platelets 230; differential found neutrophil high at 86, lymphs low at 8, all other values normal.  Metabolic panel with sodium 138, potassium 4.1, chloride 100, CO2 28, glucose high at 157, BUN 16, creatinine 1.1.  AST slightly elevated at 89 and other liver indices unremarkable.  Amylase 92 and lipase 24.  Cardiac enzymes with CK high  at 307, MB 3.3, troponin 0.03.  Follow up CK 238, MB 2.5, troponin 0.02. Final enzymes with CK high at 227, MB 2.2, troponin 0.02.  TSH normal at 1.774.  Urinalysis appeared unremarkable.  CT scan of the abdomen and pelvis done to evaluate for any abnormal etiology that might be responsible for the patients presentation.  Abdomen portion noted large hiatal hernia with bibasilar subsegmental atelectasis dependently. There was no  evidence of bowel ischemia, left renal cyst and probable small, right, mid kidney renal cyst, degenerative facet arthropathy at L4-L5 and L5-S1.  Pelvic portion noted borderline wall thickening in the rectum with equivocal finding.  Do not recommend rectal exam to exclude rectal mass. Small right inguinal hernia containing fat only.  HOSPITAL COURSE:  The patient was admitted with telemetry and clear liquid diet.  Previous medications continued with D-5-1/2 normal saline at 70 cc for hydration.  A cardiology consult was obtained with Dr. Lucas Mallow and Dr. Aleen Campi to evaluate for any abnormal cardiac etiology here.  The patient had a history of SVT since 1996.  It was also noted the patient was post MI and CABG in 1999.  He was treated with beta-blocker, Statins and aspirin.  Impression of possible tachybrady syndrome and could not rule out ischemia.  A Persantine Cardiolite study was felt to be the best approach to further evaluate here. This was done on July 14, 2001, noting QGS left ventricular ejection fraction of 56%.  There was inferior and inferolateral scar with no convincing ischemia.  It was felt the patient may require consideration for pacing.  We wished to review previous office records regarding SVT.  Post admission, the patients diet was increased back to previous without incident.  There has been no further abdominal pain or nausea and vomiting.  Again, etiology here is unclear.  Possibly, the patient passed a gallstone.  The patient had two incidents of bradycardia while on telemetry.  The patients dose of Lopressor was decreased in response.  Per the Cardiolite study, it appears that if the patients coronary artery disease is stable.  We noted an incidental finding of hiatal hernia, but I do not believe this is effecting the patients GI status.  At discharge, the patient appears medically stable without any  further cardiac complaints or abdominal complaints.  We would  recommend follow up with Dr. Murray Hodgkins in one to two weeks.  We would recommend consideration of a follow-up appointment with Dr. Lucas Mallow, cardiology, for further review and consideration of possible pacemaker placement for tachybrady syndrome.  DISCHARGE LABORATORY DATA AND X-RAY FINDINGS:  CBC on July 14, 2001, found WBC 8.2, hemoglobin 13.8, hematocrit 39.6, platelets 210.  Metabolic panel on July 14, 2001, with sodium 139, potassium 4.0, chloride 106, CO2 29, BUN 8, creatinine 1.0.  Other labs as reported above.  DISCHARGE MEDICATIONS: 1. Pravachol 20 mg two tablets equally 40 mg once daily. 2. Enteric coated aspirin 325 mg once daily. 3. Prilosec 20 mg once daily. 4. Synthroid 0.1 mcg daily. 5. Niaspan 750 mg two tablets equally 1500 mg daily at bedtime. 6. Lopressor 25 mg every 12 hours. 7. Multivitamin with iron once daily. 8. MiraLax 17 g powder mixed with eight ounces of fluid daily.  DISCHARGE DIAGNOSES: 1. Abdominal pain and vomiting, unclear etiology.  Possibly the patient passed    a gallstone. 2. Stable coronary artery disease. 3. Incidental finding of hiatal hernia. 4. Asymptomatic bradycardia with subsequent dosage reduction in Lopressor. 5. 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 Lucas Mallow from    cardiology.  CONSULTATION:  Dr. Aleen Campi, cardiology.  PROCEDURES: 1. Computed tomography scan of the abdomen and pelvis done July 13, 2001,    results as above. 2. On July 14, 2001, Persantine Cardiolite study with results as above.  DIET:  Low fat.  FOLLOWUP:  Follow up with Dr. Murray Hodgkins one to two weeks postdischarge. Will request that Dr. Chilton Si arrange followup consult with Dr. Lucas Mallow of cardiology to review possible pacemaker implant tachybrady syndrome.  DISPOSITION:  Returning to home independently where he lives with his wife.  Discharge process greater than 30 minutes from 9:30 a.m. through 10:15 a.m. Dictated by:   Lorin Picket.  Claiborne Billings, N.P. Attending Physician:  Terald Sleeper DD:  07/15/01 TD:  07/17/01 Job: [id number]

## 2010-06-22 NOTE — Op Note (Signed)
   NAME:  Adam [name], Adam [name]                           ACCOUNT NO.:  [id number]   MEDICAL RECORD NO.:  [id number]                   PATIENT TYPE:  AMB   LOCATION:  ENDO                                 FACILITY:  [facility]   PHYSICIAN:  Georgiana Spinner, M.D.                 DATE OF BIRTH:  [birth year]   DATE OF PROCEDURE:  DATE OF DISCHARGE:                                 OPERATIVE REPORT   PROCEDURE:  Colonoscopy.   ANESTHESIA:  Demerol 100, Versed 9 mg.   DESCRIPTION OF PROCEDURE:  With the patient mildly sedated in the left  lateral decubitus position, the Olympus videoscopic colonoscope was inserted  in the rectum and passed under direct vision through a very tortuous colon,  tortuous in the lower left and also on the right colon but we finally did in  fact reach the cecum identified by the ileocecal valve and appendiceal  orifice both of which were photographed. From this point, the colonoscope  was slowly withdrawn taking circumferential views of the entire colonic  mucosa stopping only then in the rectum which appeared normal on direct and  showed large internal hemorrhoids on retroflexed view and also on pull  through the anal canal. The patient's vital signs and pulse oximeter  remained stable. The patient tolerated the procedure well without apparent  complications.   FINDINGS:  Internal and external hemorrhoids; otherwise, unremarkable  examination other than tortuosity of the colon.   PLAN:  Repeat examination possibly in five years.                                                 Georgiana Spinner, M.D.    GMO/MEDQ  D:  11/20/2001  T:  11/21/2001  Job:  [id number]

## 2010-07-08 ENCOUNTER — Emergency Department (HOSPITAL_COMMUNITY)
Admission: EM | Admit: 2010-07-08 | Discharge: 2010-07-08 | Disposition: A | Discharge status: Home / Self Care | Payer: Medicare Other | Attending: Emergency Medicine | Admitting: Emergency Medicine

## 2010-07-08 DIAGNOSIS — K219 Gastro-esophageal reflux disease without esophagitis: Secondary | ICD-10-CM | POA: Insufficient documentation

## 2010-07-08 DIAGNOSIS — R5381 Other malaise: Secondary | ICD-10-CM | POA: Insufficient documentation

## 2010-07-08 DIAGNOSIS — Z79899 Other long term (current) drug therapy: Secondary | ICD-10-CM | POA: Insufficient documentation

## 2010-07-08 DIAGNOSIS — I252 Old myocardial infarction: Secondary | ICD-10-CM | POA: Insufficient documentation

## 2010-07-08 DIAGNOSIS — R42 Dizziness and giddiness: Secondary | ICD-10-CM | POA: Insufficient documentation

## 2010-07-08 DIAGNOSIS — E039 Hypothyroidism, unspecified: Secondary | ICD-10-CM | POA: Insufficient documentation

## 2010-07-08 LAB — COMPREHENSIVE METABOLIC PANEL
ALT: 16 U/L (ref 0–53)
AST: 22 U/L (ref 0–37)
Albumin: 3.5 g/dL (ref 3.5–5.2)
Alkaline Phosphatase: 58 U/L (ref 39–117)
BUN: 17 mg/dL (ref 6–23)
CO2: 29 mEq/L (ref 19–32)
Calcium: 8.6 mg/dL (ref 8.4–10.5)
Chloride: 104 mEq/L (ref 96–112)
Creatinine, Ser: 0.85 mg/dL (ref 0.4–1.5)
GFR calc Af Amer: >60 mL/min (ref >60)
GFR calc non Af Amer: >60 mL/min (ref >60)
Glucose, Bld: 97 mg/dL (ref 70–99)
Potassium: 4.5 mEq/L (ref 3.5–5.1)
Sodium: 139 mEq/L (ref 135–145)
Total Bilirubin: 0.3 mg/dL (ref 0.3–1.2)
Total Protein: 6.7 g/dL (ref 6.0–8.3)

## 2010-07-08 LAB — CBC
HCT: 40.3 % (ref 39.0–52.0)
Hemoglobin: 13.5 g/dL (ref 13.0–17.0)
MCH: 30.7 pg (ref 26.0–34.0)
MCHC: 33.5 g/dL (ref 30.0–36.0)
MCV: 91.6 fL (ref 78.0–100.0)
Platelets: 203 10*3/uL (ref 150–400)
RBC: 4.4 MIL/uL (ref 4.22–5.81)
RDW: 14.8 % (ref 11.5–15.5)
WBC: 7.7 10*3/uL (ref 4.0–10.5)

## 2010-07-21 NOTE — H&P (Signed)
Adam [name], Adam [name] NO.:  [id number]  MEDICAL RECORD NO.:  [id number]  LOCATION:  [facility]                         FACILITY:  [facility]  PHYSICIAN:  Thurmon Fair, MD     DATE OF BIRTH:  19-Jan-1927  DATE OF ADMISSION:  07/08/2010 DATE OF DISCHARGE:  07/08/2010                             HISTORY & PHYSICAL   HISTORY OF PRESENT ILLNESS:  Adam [name] is a pleasant 77 year old male who is followed by Adam [name] now.  Previously, he had seen Dr. Lynnea [name].  He has a history of coronary artery disease and has had remote bypass grafting.  In 1996, he had a LIMA to the LAD and SVG to the RCA and SVG to the OM.  In June 2011, he had a bare-metal stent to the SVG to the RCA.  His EF is 45% by [facility] on June 2011.  He has PAF and sick sinus syndrome and has a pacemaker.  He did have a generator change earlier this year.  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  He called today complaining of some dizziness.  He said his heart rate was reading 48 on his monitor at home.  He is on metoprolol.  I reassured him that his heart rate was most likely not really 48 since he has a pacemaker and it was recently checked.  I asked him to cut back his metoprolol to 12.5 b.i.d. and we will have Adam [name] see him next week.  Later, the patient's daughter called and wanted him seen.  He came to the emergency room where his pacemaker was interrogated.  He is having PVCs.  His blood pressure is 98/50.  We will continue with the current plan above, decreasing his metoprolol to 12.5 b.i.d. and follow-up with Adam [name] later this week.  His labs are unremarkable.  HOME MEDICATIONS: 1. Protonix. 2. Metoprolol 25 mg b.i.d. 3. Plavix 75 mg a day. 4. Pravachol 20 mg a day. 5. Aspirin 81 mg a day. 6. Synthroid 0.1 mg a day.  ALLERGIES:  PENICILLIN and SULFA.  SOCIAL HISTORY:  He is married and lives with his wife.  He is a nonsmoker.  FAMILY HISTORY:  Unremarkable.  REVIEW OF SYSTEMS:  Essentially  unremarkable except as noted above.  He has not had angina, he was just cathed on April 13, 2010, this revealed patent grafts.  PHYSICAL EXAMINATION:  VITAL SIGNS:  Blood pressure 98/50, pulse 70 with PVCs, respirations 12. GENERAL:  He is a well-developed elderly male in no acute distress. HEENT:  Normocephalic.  Extraocular movements are intact.  Sclerae are nonicteric. NECK:  Without JVD. CHEST:  Clear to auscultation and percussion. CARDIAC:  Regular rate and rhythm without murmur, rub or gallop.  He does have extrasystoles on exam. EXTREMITIES:  Without edema. ABDOMEN:  Nontender, nondistended. NEURO:  Grossly intact.  He is awake, alert, oriented, and cooperative. Moves all extremities without obvious deficit.  LABORATORIES:  White count 7.7, hemoglobin 13.5, hematocrit 40.3, platelets 203.  Sodium 139, potassium 4.5, BUN 17, creatinine 0.85.  IMPRESSION: 1. Dizziness, possibly orthostatic. 2. Premature ventricular contractions. 3. History of sick sinus syndrome status post pacemaker with a  generator change this year. 4. Coronary artery bypass grafting in 1996 with catheterization in     April 13, 2010, showing patent graft. 5. Mild left ventricular dysfunction with an ejection fraction of 45%. 6. Treated hypothyroidism. 7. Relative hypotension.  PLAN:  The patient was seen by Adam [name] and myself in the emergency room.  Adam [name] reviewed Adam [name]'s pacemaker interrogation.  We also reviewed his labs and examined the patient.  We feel he can be discharged home.  Plan will be to cut his metoprolol back to 12.5 mg b.i.d. and follow up with Adam [name] later this week.     Abelino Derrick, P.A.   ______________________________ Thurmon Fair, MD    LKK/MEDQ  D:  07/08/2010  T:  07/09/2010  Job:  [id number]  Electronically Signed by Corine Shelter P.A. on 07/17/2010 08:49:31 AM Electronically Signed by Thurmon Fair M.D. on 07/21/2010 07:15:38 AM

## 2011-02-07 ENCOUNTER — Other Ambulatory Visit: Payer: Self-pay | Admitting: Internal Medicine

## 2011-02-07 DIAGNOSIS — M25531 Pain in right wrist: Secondary | ICD-10-CM

## 2011-04-11 ENCOUNTER — Ambulatory Visit: Payer: Medicare Other | Attending: Internal Medicine | Admitting: Physical Therapy

## 2011-04-11 DIAGNOSIS — IMO0001 Reserved for inherently not codable concepts without codable children: Secondary | ICD-10-CM | POA: Insufficient documentation

## 2011-04-11 DIAGNOSIS — R269 Unspecified abnormalities of gait and mobility: Secondary | ICD-10-CM | POA: Insufficient documentation

## 2011-04-11 DIAGNOSIS — R42 Dizziness and giddiness: Secondary | ICD-10-CM | POA: Insufficient documentation

## 2011-04-16 ENCOUNTER — Ambulatory Visit: Payer: Medicare Other | Admitting: Physical Therapy

## 2011-04-18 ENCOUNTER — Ambulatory Visit: Payer: Medicare Other | Admitting: Physical Therapy

## 2011-04-23 ENCOUNTER — Ambulatory Visit: Payer: Medicare Other | Admitting: Physical Therapy

## 2011-04-25 ENCOUNTER — Ambulatory Visit: Payer: Medicare Other | Admitting: Physical Therapy

## 2011-04-30 ENCOUNTER — Ambulatory Visit: Payer: Medicare Other | Admitting: Physical Therapy

## 2011-05-02 ENCOUNTER — Ambulatory Visit: Payer: Medicare Other | Admitting: Physical Therapy

## 2011-05-14 ENCOUNTER — Ambulatory Visit: Payer: Medicare Other | Attending: Internal Medicine | Admitting: Physical Therapy

## 2011-05-14 DIAGNOSIS — R42 Dizziness and giddiness: Secondary | ICD-10-CM | POA: Insufficient documentation

## 2011-05-14 DIAGNOSIS — IMO0001 Reserved for inherently not codable concepts without codable children: Secondary | ICD-10-CM | POA: Insufficient documentation

## 2011-05-14 DIAGNOSIS — R269 Unspecified abnormalities of gait and mobility: Secondary | ICD-10-CM | POA: Insufficient documentation

## 2011-05-16 ENCOUNTER — Ambulatory Visit: Payer: Medicare Other | Admitting: Physical Therapy

## 2011-06-05 ENCOUNTER — Emergency Department (HOSPITAL_COMMUNITY): Payer: Medicare Other

## 2011-06-05 ENCOUNTER — Encounter (HOSPITAL_COMMUNITY): Payer: Self-pay | Admitting: Adult Health

## 2011-06-05 ENCOUNTER — Observation Stay (HOSPITAL_COMMUNITY)
Admission: EM | Admit: 2011-06-05 | Discharge: 2011-06-08 | Disposition: A | Discharge status: Home / Self Care | Payer: Medicare Other | Attending: Cardiovascular Disease | Admitting: Cardiovascular Disease

## 2011-06-05 DIAGNOSIS — R0789 Other chest pain: Principal | ICD-10-CM | POA: Diagnosis present

## 2011-06-05 DIAGNOSIS — R079 Chest pain, unspecified: Secondary | ICD-10-CM

## 2011-06-05 DIAGNOSIS — R42 Dizziness and giddiness: Secondary | ICD-10-CM

## 2011-06-05 DIAGNOSIS — I2510 Atherosclerotic heart disease of native coronary artery without angina pectoris: Secondary | ICD-10-CM

## 2011-06-05 DIAGNOSIS — I959 Hypotension, unspecified: Secondary | ICD-10-CM | POA: Diagnosis not present

## 2011-06-05 DIAGNOSIS — R001 Bradycardia, unspecified: Secondary | ICD-10-CM | POA: Diagnosis present

## 2011-06-05 DIAGNOSIS — I498 Other specified cardiac arrhythmias: Secondary | ICD-10-CM | POA: Insufficient documentation

## 2011-06-05 DIAGNOSIS — Z951 Presence of aortocoronary bypass graft: Secondary | ICD-10-CM | POA: Insufficient documentation

## 2011-06-05 DIAGNOSIS — R110 Nausea: Secondary | ICD-10-CM | POA: Diagnosis present

## 2011-06-05 DIAGNOSIS — Z950 Presence of cardiac pacemaker: Secondary | ICD-10-CM | POA: Insufficient documentation

## 2011-06-05 DIAGNOSIS — Z9889 Other specified postprocedural states: Secondary | ICD-10-CM

## 2011-06-05 HISTORY — DX: Presence of aortocoronary bypass graft: Z95.1

## 2011-06-05 HISTORY — DX: Atherosclerotic heart disease of native coronary artery without angina pectoris: I25.10

## 2011-06-05 HISTORY — DX: Diaphragmatic hernia without obstruction or gangrene: K44.9

## 2011-06-05 HISTORY — DX: Hyperlipidemia, unspecified: E78.5

## 2011-06-05 HISTORY — DX: Dizziness and giddiness: R42

## 2011-06-05 HISTORY — DX: Hypothyroidism, unspecified: E03.9

## 2011-06-05 HISTORY — DX: Presence of cardiac pacemaker: Z95.0

## 2011-06-05 HISTORY — PX: NM MYOVIEW LTD: HXRAD82

## 2011-06-05 HISTORY — DX: Bradycardia, unspecified: R00.1

## 2011-06-05 HISTORY — DX: Gastro-esophageal reflux disease without esophagitis: K21.9

## 2011-06-05 HISTORY — DX: Insomnia, unspecified: G47.00

## 2011-06-05 LAB — BASIC METABOLIC PANEL
BUN: 19 mg/dL (ref 6–23)
CO2: 26 mEq/L (ref 19–32)
Calcium: 8.6 mg/dL (ref 8.4–10.5)
Chloride: 103 mEq/L (ref 96–112)
Creatinine, Ser: 0.85 mg/dL (ref 0.50–1.35)
GFR calc Af Amer: >90 mL/min (ref >90)
GFR calc non Af Amer: 79 mL/min — ABNORMAL LOW (ref >90)
Glucose, Bld: 97 mg/dL (ref 70–99)
Potassium: 4 mEq/L (ref 3.5–5.1)
Sodium: 136 mEq/L (ref 135–145)

## 2011-06-05 LAB — POCT I-STAT TROPONIN I
Troponin i, poc: 0.02 ng/mL (ref 0.00–0.08)
Troponin i, poc: 0 ng/mL (ref 0.00–0.08)

## 2011-06-05 LAB — CBC
HCT: 37.8 % — ABNORMAL LOW (ref 39.0–52.0)
Hemoglobin: 12.7 g/dL — ABNORMAL LOW (ref 13.0–17.0)
MCH: 31.7 pg (ref 26.0–34.0)
MCHC: 33.6 g/dL (ref 30.0–36.0)
MCV: 94.3 fL (ref 78.0–100.0)
Platelets: 199 10*3/uL (ref 150–400)
RBC: 4.01 MIL/uL — ABNORMAL LOW (ref 4.22–5.81)
RDW: 14.6 % (ref 11.5–15.5)
WBC: 7.7 10*3/uL (ref 4.0–10.5)

## 2011-06-05 LAB — CARDIAC PANEL(CRET KIN+CKTOT+MB+TROPI)
CK, MB: 2.8 ng/mL (ref 0.3–4.0)
Relative Index: 2.4 (ref 0.0–2.5)
Total CK: 118 U/L (ref 7–232)
Troponin I: <0.3 ng/mL (ref <0.30)

## 2011-06-05 MED ORDER — TORSEMIDE 20 MG PO TABS
20.0000 mg | ORAL_TABLET | ORAL | Status: DC
  Administered 2011-06-06: 20 mg via ORAL
  Filled 2011-06-05: qty 1

## 2011-06-05 MED ORDER — HEPARIN SODIUM (PORCINE) 5000 UNIT/ML IJ SOLN
5000.0000 [IU] | Freq: Three times a day (TID) | INTRAMUSCULAR | Status: DC
  Administered 2011-06-05 – 2011-06-08 (×8): 5000 [IU] via SUBCUTANEOUS
  Filled 2011-06-05 (×11): qty 1

## 2011-06-05 MED ORDER — DOCUSATE SODIUM 100 MG PO CAPS
100.0000 mg | ORAL_CAPSULE | Freq: Two times a day (BID) | ORAL | Status: DC
  Administered 2011-06-05 – 2011-06-08 (×6): 100 mg via ORAL
  Filled 2011-06-05 (×7): qty 1

## 2011-06-05 MED ORDER — SODIUM CHLORIDE 0.9 % IJ SOLN
3.0000 mL | Freq: Two times a day (BID) | INTRAMUSCULAR | Status: DC
  Administered 2011-06-05 – 2011-06-06 (×2): 3 mL via INTRAVENOUS
  Administered 2011-06-06: 10:00:00 via INTRAVENOUS
  Administered 2011-06-07 – 2011-06-08 (×3): 3 mL via INTRAVENOUS

## 2011-06-05 MED ORDER — SENNA 8.6 MG PO TABS
1.0000 | ORAL_TABLET | Freq: Two times a day (BID) | ORAL | Status: DC
  Administered 2011-06-05 – 2011-06-08 (×6): 8.6 mg via ORAL
  Filled 2011-06-05 (×7): qty 1

## 2011-06-05 MED ORDER — ACETAMINOPHEN 650 MG RE SUPP: 650.0000 mg | Freq: Four times a day (QID) | RECTAL | Status: DC | PRN

## 2011-06-05 MED ORDER — SIMVASTATIN 40 MG PO TABS
40.0000 mg | ORAL_TABLET | Freq: Every day | ORAL | Status: DC
  Administered 2011-06-06 – 2011-06-07 (×2): 40 mg via ORAL
  Filled 2011-06-05 (×3): qty 1

## 2011-06-05 MED ORDER — NITROGLYCERIN 0.4 MG SL SUBL: 0.4000 mg | SUBLINGUAL_TABLET | SUBLINGUAL | Status: DC | PRN

## 2011-06-05 MED ORDER — ASPIRIN EC 325 MG PO TBEC
325.0000 mg | DELAYED_RELEASE_TABLET | Freq: Every day | ORAL | Status: DC
  Administered 2011-06-06 – 2011-06-08 (×3): 325 mg via ORAL
  Filled 2011-06-05 (×3): qty 1

## 2011-06-05 MED ORDER — CLOPIDOGREL BISULFATE 75 MG PO TABS
75.0000 mg | ORAL_TABLET | Freq: Every day | ORAL | Status: DC
  Administered 2011-06-06 – 2011-06-08 (×3): 75 mg via ORAL
  Filled 2011-06-05 (×3): qty 1

## 2011-06-05 MED ORDER — MORPHINE SULFATE 2 MG/ML IJ SOLN: 2.0000 mg | INTRAMUSCULAR | Status: DC | PRN

## 2011-06-05 MED ORDER — SODIUM CHLORIDE 0.9 % IV SOLN: 250.0000 mL | INTRAVENOUS | Status: DC | PRN

## 2011-06-05 MED ORDER — SODIUM CHLORIDE 0.9 % IJ SOLN: 3.0000 mL | INTRAMUSCULAR | Status: DC | PRN

## 2011-06-05 MED ORDER — ACETAMINOPHEN 325 MG PO TABS: 650.0000 mg | ORAL_TABLET | Freq: Four times a day (QID) | ORAL | Status: DC | PRN

## 2011-06-05 MED ORDER — METOPROLOL TARTRATE 25 MG PO TABS
25.0000 mg | ORAL_TABLET | Freq: Two times a day (BID) | ORAL | Status: DC
  Administered 2011-06-05 – 2011-06-08 (×5): 25 mg via ORAL
  Filled 2011-06-05 (×7): qty 1

## 2011-06-05 MED ORDER — ONDANSETRON HCL 4 MG/2ML IJ SOLN: 4.0000 mg | Freq: Four times a day (QID) | INTRAMUSCULAR | Status: DC | PRN

## 2011-06-05 MED ORDER — SODIUM CHLORIDE 0.9 % IJ SOLN
3.0000 mL | Freq: Two times a day (BID) | INTRAMUSCULAR | Status: DC
  Administered 2011-06-06: 3 mL via INTRAVENOUS

## 2011-06-05 MED ORDER — ONDANSETRON HCL 4 MG PO TABS: 4.0000 mg | ORAL_TABLET | Freq: Four times a day (QID) | ORAL | Status: DC | PRN

## 2011-06-05 MED ORDER — ZOLPIDEM TARTRATE 5 MG PO TABS
2.5000 mg | ORAL_TABLET | Freq: Every evening | ORAL | Status: AC | PRN
  Administered 2011-06-05: 2.5 mg via ORAL
  Filled 2011-06-05: qty 1

## 2011-06-05 MED ORDER — LEVOTHYROXINE SODIUM 100 MCG PO TABS
100.0000 ug | ORAL_TABLET | Freq: Every day | ORAL | Status: DC
  Administered 2011-06-06 – 2011-06-08 (×3): 100 ug via ORAL
  Filled 2011-06-05 (×4): qty 1

## 2011-06-05 NOTE — ED Notes (Signed)
Family here and at bedside.

## 2011-06-05 NOTE — ED Notes (Signed)
Pt eating [place] sandwich wife at bedside.  Pt denies any chest pain or any other complaints.

## 2011-06-05 NOTE — H&P (Signed)
PCP:  Juline Patch, MD, MD   Confirmed Dr. Royann Shivers, cardiology   Chief Complaint:  Chest tightness, dizziness, nausea  HPI: 84yoM with h/o CABG (LIMA-LAD, SVG-RCA, SVG-OM in 1996), then 07/2009 BMS to SVG-RCA, cath in  04/2010 with patent grafts, AFib/SSS s/p St Jude PPM presents with sudden onset nausea, dizziness,  chest tightness.   Pt was last admitted in 04/2010 with dizziness and chest discomfort and had cath with patent  grafts, nothing to intervene on, medically managed. Then seen again 07/2010 with dizziness, sent to  ED where interrogation showed PVC's, and BP was 98/50. Metoprolol was decreased to 12.5 BID and he  was sent home from ED to f/u with Dr. Royann Shivers. Before that, in 09/2009 he was admitted for  dizziness and chest discomfort, and his prior cardiologist Dr. Lynnea Ferrier notes that his dizziness  had been an intermittent problem for some time, that had previously responded to medication  adjustment, and during that admission they stopped losartan and decreased metoprolol, added imdur.   Pt is reliable historian, states he awoke suddenly at 3a this am with nausea and dizziness with  left sided chest tightness, no radiation elsewhere. He tried taking tums and ginger ale thinking  this was gastric in nature, however they note that dyspepsia was the presenting sign of his MI in  the past, and these did not provide much relief. He did not vomit, and there's no associated  diarrhea. In the am he eventually went to his PCP's office where an ECG was done, sent to Dr.  Royann Shivers, who recommended ED evaluation.   In the ED, BP slightly low 105/47, however pt at baseline runs low per wife and pt. Other vitals  stable. Labs with normal chem panel, Trop POC negative x2, stable CBC. CXR negative. [facility] was  called from the ED and recommended medicine admission, to consult in the am.   ROS as above otherwise pt states his exercise tolerance has been limited these past few months by    dyspnea and fatigue, whereas years ago he could walk 30 mins each day now he's not been able to as  much, but has gotten up to 15 mins/day recently. There is no angina. No fevers, chills, sweats,  other GI issues. ROS o/w negative.   Past Medical History  Diagnosis Date  . CAD (coronary artery disease)     CABG (LIMA-LAD, SVG-RCA, SVG-OM in 1996).  07/2009 BMS to SVG-RCA. Cath in 04/2010 with patent stents   . Bradycardia     AFib/SSS s/p St Jude PPM  . Hypothyroid   . Pacemaker   . Hyperlipidemia   . GERD (gastroesophageal reflux disease)   . Hiatal hernia   . Insomnia   . S/P CABG x 4     History reviewed. No pertinent past surgical history.  Medications:  HOME MEDS:  Prior to Admission medications   Medication Sig Start Date End Date Taking? Authorizing Provider  aspirin 81 MG chewable tablet Chew 81 mg by mouth daily.   Yes Historical Provider, MD  clobetasol cream (TEMOVATE) 0.05 % Apply topically daily as needed.   Yes Historical Provider, MD  clopidogrel (PLAVIX) 75 MG tablet Take 75 mg by mouth daily.   Yes Historical Provider, MD  diazepam (VALIUM) 5 MG tablet Take 5 mg by mouth daily as needed. For anxiety   Yes Historical Provider, MD  levothyroxine (SYNTHROID, LEVOTHROID) 100 MCG tablet Take 100 mcg by mouth daily.   Yes Historical Provider, MD  metoprolol tartrate (  LOPRESSOR) 25 MG tablet Take 25 mg by mouth 2 (two) times daily.   Yes Historical Provider, MD  Nutritional Supplements (VITAMIN D MAINTENANCE PO) Take 5,000 Units by mouth daily.   Yes Historical Provider, MD  pantoprazole (PROTONIX) 40 MG tablet Take 40 mg by mouth daily.   Yes Historical Provider, MD  pravastatin (PRAVACHOL) 80 MG tablet Take 40 mg by mouth daily.   Yes Historical Provider, MD  torsemide (DEMADEX) 20 MG tablet Take 20 mg by mouth 4 (four) times a week.   Yes Historical Provider, MD  zolpidem (AMBIEN) 5 MG tablet Take 2.5 mg by mouth at bedtime as needed. For sleep   Yes Historical  Provider, MD    Allergies:  Allergies  Allergen Reactions  . Penicillins Rash  . Sulfa Antibiotics Rash    Social History:   reports that he has never smoked. He does not have any smokeless tobacco history on file. He reports that he does not drink alcohol or use illicit drugs.  He lives at home with his wife and is ambulatory without cane or walker. He has children   Family History: History reviewed. No pertinent family history.  Physical Exam: Filed Vitals:   06/05/11 1726  BP: 105/47  Pulse: 70  Temp: 98.3 F (36.8 C)  TempSrc: Oral  Resp: 20  SpO2: 95%   Blood pressure 105/47, pulse 70, temperature 98.3 F (36.8 C), temperature source Oral, resp. rate 20, SpO2 95.00%. Gen: Elderly but still fairly healthy appearing M in no distress, able to relate history well,  pleasant and smiling, no distress, breathing comfortably HEENT: Pupils round and equal, EOMI, sclera clear and normal mouth moist and normal appearing Lungs: CTAB no w/c/r, good air movement, normal exam Heart: Regular except beats are coupled with 1-2 extra beats each, and with a holosystolic soft  murmur appreciated Abd: Soft, not tender, not distended, normal exam, no TTP Extrem: Warm, perfusing normally, radials easily palpable, BUE's have ecchymoses, no BLE edema  noted Neuro: Alert, attentive, conversant, face symmetric, speech clear, moves extremities  spontaneously, grossly non-focal    Labs & Imaging Results for orders placed during the hospital encounter of 06/05/11 (from the past 48 hour(s))  CBC     Status: Abnormal   Collection Time   06/05/11  5:31 PM      Component Value Range Comment   WBC 7.7  4.0 - 10.5 (K/uL)    RBC 4.01 (*) 4.22 - 5.81 (MIL/uL)    Hemoglobin 12.7 (*) 13.0 - 17.0 (g/dL)    HCT 98.1 (*) 19.1 - 52.0 (%)    MCV 94.3  78.0 - 100.0 (fL)    MCH 31.7  26.0 - 34.0 (pg)    MCHC 33.6  30.0 - 36.0 (g/dL)    RDW 47.8  29.5 - 62.1 (%)    Platelets 199  150 - 400 (K/uL)   BASIC  METABOLIC PANEL     Status: Abnormal   Collection Time   06/05/11  5:31 PM      Component Value Range Comment   Sodium 136  135 - 145 (mEq/L)    Potassium 4.0  3.5 - 5.1 (mEq/L)    Chloride 103  96 - 112 (mEq/L)    CO2 26  19 - 32 (mEq/L)    Glucose, Bld 97  70 - 99 (mg/dL)    BUN 19  6 - 23 (mg/dL)    Creatinine, Ser 3.08  0.50 - 1.35 (mg/dL)    Calcium  8.6  8.4 - 10.5 (mg/dL)    GFR calc non Af Amer 79 (*) >90 (mL/min)    GFR calc Af Amer >90  >90 (mL/min)   POCT I-STAT TROPONIN I     Status: Normal   Collection Time   06/05/11  5:41 PM      Component Value Range Comment   Troponin i, poc 0.02  0.00 - 0.08 (ng/mL)    Comment 3            POCT I-STAT TROPONIN I     Status: Normal   Collection Time   06/05/11  6:41 PM      Component Value Range Comment   Troponin i, poc 0.00  0.00 - 0.08 (ng/mL)    Comment 3             Dg Chest Port 1 View  06/05/2011  *RADIOLOGY REPORT*  Clinical Data: Chest pain and dizziness  PORTABLE CHEST - 1 VIEW  Comparison: 04/05/2010  Findings: Left chest wall pacer device is noted with lead in the right atrial appendage and right ventricle.  Prior median sternotomy and CABG procedure.  Mild cardiac enlargement.  Large hiatal hernia is noted.  No pleural effusion or edema.  IMPRESSION:  1.  No acute cardiopulmonary abnormalities.  Original Report Authenticated By: Rosealee Albee, M.D.     ECG: Apaced with RBBB pattern QRS complex coupled bigeminally with a different RBBB pattern  ventricular beat. There are however pacer spikes that are inappropriately firing off in the middle  of the second bigeminal beats. This appears similar to prior except the inappropriate pacer  spikes.   Impression Present on Admission:  .CAD (coronary artery disease) .Bradycardia .Chest tightness .Nausea .Dizziness  84yoM with h/o CABG (LIMA-LAD, SVG-RCA, SVG-OM in 1996), then 07/2009 BMS to SVG-RCA, cath in  04/2010 with patent grafts, AFib/SSS s/p St Jude PPM presents with  sudden onset nausea, dizziness,  chest tightness.   1. Chest tightness, nausea, dizzziness: Although pt is without current objective evidence of  ischemia, his cardiac history warrants a rule out. His ECG shows inappropriately paced spikes in  ventricular bigeminal beats, but I tend to doubt this is the cause of his symptoms and possibly an  incidental finding, although I am not sure. Have spoken to [facility] who agree to rule out admission,  and will interrogate pacer in the am, given that he is symptom free and stable at present.   - Observation on telemetry, trend enzymes and ECG, pacer interrogation in the am. NPOpMN in case  intervention is needed.  - Increase to ASA 325 but can go back to 81 if rules out. Continue plavix - Continue metoprolol, statin, torsemide   2. Continue levothyroxine, holding hold valium, ambien  SubQ heparin Telemetry, [facility] team 4 Presumed full code   Other plans as per orders.    Carma Dwiggins 06/05/2011, 8:50 PM

## 2011-06-05 NOTE — ED Provider Notes (Signed)
History     CSN: [id number]  Arrival date & time 06/05/11  1722   None     Chief Complaint  Patient presents with  . Chest Pain    (Consider location/radiation/quality/duration/timing/severity/associated sxs/prior treatment) HPI history of present illness chief complaint: Dizziness and chest tightness. Patient arrived by private vehicle. History provided by patient.No language barriers identified. Information not limited. Onset of symptoms 24 hours ago. Location of symptoms chest. Symptoms improved spontaneously and not worsened by anything. Quality radiation severity not pertinent to this chief complaint. Timing is intermittent. Duration 24 hours. Context the patient states these symptoms are similar to his last 2 myocardial infarctions. For associated signs and symptoms please refer to the review of systems. No treatments tried prior to arrival. Regarding recent medical care patient was seen at urgent care and sent here after they discussed the patient's EKG findings with the patient's cardiologist. Regarding patient's social history please refer to the nurse's notes. I reviewed the patient's past medical past surgical past social history as well as medications and allergies.  Past Medical History  Diagnosis Date  . CAD (coronary artery disease)   . Bradycardia   . Hypothyroid   . Pacemaker   . Hyperlipidemia   . GERD (gastroesophageal reflux disease)   . Hiatal hernia   . Insomnia   . S/P CABG x 4     History reviewed. No pertinent past surgical history.  History reviewed. No pertinent family history.  History  Substance Use Topics  . Smoking status: Never Smoker   . Smokeless tobacco: Not on file  . Alcohol Use: No      Review of Systems  Constitutional: Negative for fever and chills.  HENT: Negative for neck pain and neck stiffness.   Eyes: Negative for visual disturbance.  Respiratory: Positive for chest tightness. Negative for cough and shortness of breath.     Cardiovascular: Negative for chest pain, palpitations and leg swelling.  Gastrointestinal: Negative for nausea, vomiting, abdominal pain, diarrhea, constipation, blood in stool and abdominal distention.  Genitourinary: Negative for dysuria, urgency, hematuria and difficulty urinating.  Musculoskeletal: Negative for back pain and gait problem.  Skin: Negative for rash.  Neurological: Positive for dizziness. Negative for tremors, seizures, syncope, facial asymmetry, speech difficulty, weakness, light-headedness, numbness and headaches.  Hematological: Negative for adenopathy. Does not bruise/bleed easily.  Psychiatric/Behavioral: Negative for confusion.    Allergies  Penicillins and Sulfa antibiotics  Home Medications   Current Outpatient Rx  Name Route Sig Dispense Refill  . ASPIRIN 81 MG PO CHEW Oral Chew 81 mg by mouth daily.    Marland Kitchen CLOBETASOL PROPIONATE 0.05 % EX CREA Topical Apply topically daily as needed.    . CLOPIDOGREL BISULFATE 75 MG PO TABS Oral Take 75 mg by mouth daily.    Marland Kitchen DIAZEPAM 5 MG PO TABS Oral Take 5 mg by mouth daily as needed. For anxiety    . LEVOTHYROXINE SODIUM 100 MCG PO TABS Oral Take 100 mcg by mouth daily.    Marland Kitchen METOPROLOL TARTRATE 25 MG PO TABS Oral Take 25 mg by mouth 2 (two) times daily.    Marland Kitchen VITAMIN D MAINTENANCE PO Oral Take 5,000 Units by mouth daily.    Marland Kitchen PANTOPRAZOLE SODIUM 40 MG PO TBEC Oral Take 40 mg by mouth daily.    Marland Kitchen PRAVASTATIN SODIUM 80 MG PO TABS Oral Take 40 mg by mouth daily.    . TORSEMIDE 20 MG PO TABS Oral Take 20 mg by mouth 4 (four) times  a week.    Marland Kitchen ZOLPIDEM TARTRATE 5 MG PO TABS Oral Take 2.5 mg by mouth at bedtime as needed. For sleep      BP 105/47  Pulse 70  Temp(Src) 98.3 F (36.8 C) (Oral)  Resp 20  SpO2 95%  Physical Exam  Constitutional: He is oriented to person, place, and time. He appears well-developed and well-nourished. No distress.  HENT:  Head: Normocephalic.  Eyes: Conjunctivae are normal.  Neck: Normal  range of motion. Neck supple.  Cardiovascular: Normal rate, regular rhythm, normal heart sounds and intact distal pulses.   No murmur heard. Pulmonary/Chest: Effort normal and breath sounds normal. No respiratory distress. He has no wheezes. He has no rales. He exhibits no tenderness.  Abdominal: Soft. Bowel sounds are normal. He exhibits no distension and no mass. There is no tenderness. There is no rebound and no guarding.  Musculoskeletal: Normal range of motion. He exhibits no edema and no tenderness.  Neurological: He is alert and oriented to person, place, and time. He has normal strength. No cranial nerve deficit or sensory deficit. Coordination normal. GCS eye subscore is 4. GCS verbal subscore is 5. GCS motor subscore is 6.  Skin: Skin is warm and dry. He is not diaphoretic.  Psychiatric: He has a normal mood and affect.    ED Course  Procedures (including critical care time)  Labs Reviewed  CBC - Abnormal; Notable for the following:    RBC 4.01 (*)    Hemoglobin 12.7 (*)    HCT 37.8 (*)    All other components within normal limits  BASIC METABOLIC PANEL - Abnormal; Notable for the following:    GFR calc non Af Amer 79 (*)    All other components within normal limits   Dg Chest Port 1 View  06/05/2011  *RADIOLOGY REPORT*  Clinical Data: Chest pain and dizziness  PORTABLE CHEST - 1 VIEW  Comparison: 04/05/2010  Findings: Left chest wall pacer device is noted with lead in the right atrial appendage and right ventricle.  Prior median sternotomy and CABG procedure.  Mild cardiac enlargement.  Large hiatal hernia is noted.  No pleural effusion or edema.  IMPRESSION:  1.  No acute cardiopulmonary abnormalities.  Original Report Authenticated By: Rosealee Albee, M.D.     1. CAD (coronary artery disease)   2. Bradycardia       Date: 06/06/2011  Rate: 76  Rhythm: premature ventricular contractions (PVC)  QRS Axis: normal  Intervals: Paced rhythm  ST/T Wave abnormalities:  Concordant R and T waves throughout.  Conduction Disutrbances:Paced rhythm  Narrative Interpretation:   Old EKG Reviewed: Similar to prior with exception of Pacer spike on R wave noted on todays EKG.    MDM  Pt is a well-appearing 78 year old male with past medical history of coronary artery disease status post coronary artery bypass graft x4 and heart catheterization approximately 1 year ago with vessel disease but medical management recommended at that time. Patient presents today for 24 hours of intermittent dizziness and chest tightness similar to his last 2 myocardial infarctions. Patient was seen at urgent care today and had reported EKG abnormalities there which were discussed with his on-call cardiologist who recommended the patient come to [facility] emergency department. Vital signs stable patient is afebrile in no acute distress. Patient denies all complaints now stating he feels fine. EKG is noted. Concern for pacer spike on R wave. At labs unremarkable including troponin. Cardiologist consult and recommends admission to medicine service  for ACS rule out and possible pacemaker interrogation in the morning. Patient was admitted in stable condition. Patient took aspirin already today.        Consuello Masse, MD 06/06/11 [phone]

## 2011-06-05 NOTE — ED Notes (Signed)
C/o 9/10 chest tightness that began at 3 am associated with nausea and dizziness, took tums and ginger ale, helped for a short time. At 2 pm this afternoon pain came back, went to to Doctor. EKG completed and showed a RBB< ectopy and t wave changes. Pt has demand pacer, single chamber. HX past MI. Has NTG at home, did not try for pain. Given 4 81 mg ASA at home.

## 2011-06-05 NOTE — ED Notes (Signed)
Attempted to call report, sec. Reports nurse st's she will call me back in 15 mins.

## 2011-06-06 LAB — CARDIAC PANEL(CRET KIN+CKTOT+MB+TROPI)
CK, MB: 2.7 ng/mL (ref 0.3–4.0)
CK, MB: 3.3 ng/mL (ref 0.3–4.0)
Relative Index: 2.6 — ABNORMAL HIGH (ref 0.0–2.5)
Relative Index: 2.7 — ABNORMAL HIGH (ref 0.0–2.5)
Total CK: 105 U/L (ref 7–232)
Total CK: 123 U/L (ref 7–232)
Troponin I: <0.3 ng/mL (ref <0.30)
Troponin I: <0.3 ng/mL (ref <0.30)

## 2011-06-06 LAB — BASIC METABOLIC PANEL
BUN: 17 mg/dL (ref 6–23)
CO2: 26 mEq/L (ref 19–32)
Calcium: 8.4 mg/dL (ref 8.4–10.5)
Chloride: 106 mEq/L (ref 96–112)
Creatinine, Ser: 0.78 mg/dL (ref 0.50–1.35)
GFR calc Af Amer: >90 mL/min (ref >90)
GFR calc non Af Amer: 82 mL/min — ABNORMAL LOW (ref >90)
Glucose, Bld: 88 mg/dL (ref 70–99)
Potassium: 3.5 mEq/L (ref 3.5–5.1)
Sodium: 139 mEq/L (ref 135–145)

## 2011-06-06 LAB — CBC
HCT: 37.2 % — ABNORMAL LOW (ref 39.0–52.0)
Hemoglobin: 12.2 g/dL — ABNORMAL LOW (ref 13.0–17.0)
MCH: 31.1 pg (ref 26.0–34.0)
MCHC: 32.8 g/dL (ref 30.0–36.0)
MCV: 94.9 fL (ref 78.0–100.0)
Platelets: 197 10*3/uL (ref 150–400)
RBC: 3.92 MIL/uL — ABNORMAL LOW (ref 4.22–5.81)
RDW: 14.7 % (ref 11.5–15.5)
WBC: 5.4 10*3/uL (ref 4.0–10.5)

## 2011-06-06 MED ORDER — ISOSORBIDE MONONITRATE ER 30 MG PO TB24
30.0000 mg | ORAL_TABLET | Freq: Every day | ORAL | Status: DC
  Administered 2011-06-06 – 2011-06-08 (×3): 30 mg via ORAL
  Filled 2011-06-06 (×3): qty 1

## 2011-06-06 MED ORDER — SODIUM CHLORIDE 0.9 % IV BOLUS (SEPSIS)
250.0000 mL | Freq: Once | INTRAVENOUS | Status: AC
  Administered 2011-06-06: 250 mL via INTRAVENOUS

## 2011-06-06 NOTE — Progress Notes (Signed)
UR Completed.  Millie Shorb Jane [phone] 06/06/2011  

## 2011-06-06 NOTE — Evaluation (Signed)
Physical Therapy Evaluation Patient Details Name: Adam [name] MRN: [id number] DOB: 1927-01-17 Today's Date: 06/06/2011 Time: 8295-6213 PT Time Calculation (min): 36 min  PT Assessment / Plan / Recommendation Clinical Impression  Pt. was admitted with bradycardia, nausea, dizziness and CAD and presents with decreased independence in functional mobility and gait  .  Will benefit from acute PT to address mobility issues.  Orthostatics negative per RN and per one PT blood pressure.      PT Assessment  Patient needs continued PT services    Follow Up Recommendations  Home health PT;Other (comment) (depending on how he progresses)    Equipment Recommendations  Rolling walker with 5" wheels;Other (comment) (possible need for RW, TBD)    Frequency Min 3X/week    Precautions / Restrictions Precautions Precautions: Fall Restrictions Weight Bearing Restrictions: No   Pertinent Vitals/Pain Attempted to obtain vitals prior to initiation of mobility, however pt. Needed to rush to bathroom with PT assist.  Once back at EOB following walk, BP 136/84.  Pt. Was symptomatic with dizziness when up ambulation.      Mobility  Bed Mobility Bed Mobility: Supine to Sit;Sit to Supine Supine to Sit: 5: Supervision;HOB flat;With rails Sit to Supine: Not Tested (comment);Other (comment) (pt. sat at EOB upon return to bed) Details for Bed Mobility Assistance: increased time needed due to dizziness Transfers Transfers: Sit to Stand;Stand to Sit Sit to Stand: 4: Min assist;From bed;From chair/3-in-1;With upper extremity assist Stand to Sit: 4: Min assist Details for Transfer Assistance: safety and hand placement cues Ambulation/Gait Ambulation/Gait Assistance: 4: Min assist Ambulation Distance (Feet): 60 Feet Assistive device: 1 person hand held assist Ambulation/Gait Assistance Details: moderate unsteadiness on feet requiring assist to stabilize Gait Pattern: Step-through pattern;Trunk flexed Gait  velocity: slowed Stairs: No    Exercises     PT Goals Acute Rehab PT Goals PT Goal Formulation: With patient Time For Goal Achievement: 06/20/11 Potential to Achieve Goals: Good Pt will go Supine/Side to Sit: Independently PT Goal: Supine/Side to Sit - Progress: Goal set today Pt will go Sit to Supine/Side: Independently PT Goal: Sit to Supine/Side - Progress: Goal set today Pt will go Sit to Stand: with modified independence PT Goal: Sit to Stand - Progress: Goal set today Pt will go Stand to Sit: with modified independence PT Goal: Stand to Sit - Progress: Goal set today Pt will Ambulate: 51 - 150 feet;with modified independence;with least restrictive assistive device PT Goal: Ambulate - Progress: Goal set today  Visit Information  Last PT Received On: 06/06/11    Subjective Data  Subjective: Been to the bathroom with nursing staff, slight dizziness Patient Stated 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   Prior Functioning  Home Living Lives With: Spouse Available Help at Discharge: Family Type of Home: House Home Access: Stairs to enter Secretary/administrator of Steps: 1 Entrance Stairs-Rails: None Home Layout: One level Bathroom Shower/Tub: Walk-in shower;Door [employer] Toilet: Handicapped height Home Adaptive Equipment: None Prior Function Level of Independence: Independent Able to Take Stairs?: Yes Driving: Yes Vocation: Other (comment) (semi-retired) Communication Communication: No difficulties Dominant Hand: Right    Cognition  Overall Cognitive Status: Appears within functional limits for tasks assessed/performed Arousal/Alertness: Awake/alert Orientation Level: Appears intact for tasks assessed Behavior During Session: [facility] for tasks performed    Extremity/Trunk Assessment Right Upper Extremity Assessment RUE ROM/Strength/Tone: [facility] for tasks assessed Left Upper Extremity Assessment LUE ROM/Strength/Tone: [facility] for tasks  assessed Right Lower Extremity Assessment RLE ROM/Strength/Tone:  WFL for tasks assessed RLE Coordination: WFL - gross/fine motor Left Lower Extremity Assessment LLE ROM/Strength/Tone: WFL for tasks assessed LLE Coordination: WFL - gross/fine motor Trunk Assessment Trunk Assessment: Normal   Balance Balance Balance Assessed: Yes Static Sitting Balance Static Sitting - Balance Support: No upper extremity supported;Feet supported Static Sitting - Level of Assistance: 5: Stand by assistance Static Sitting - Comment/# of Minutes: 5 Dynamic Sitting Balance Dynamic Sitting - Balance Support: No upper extremity supported;Feet supported;During functional activity Dynamic Sitting - Level of Assistance: 5: Stand by assistance Static Standing Balance Static Standing - Balance Support: No upper extremity supported;During functional activity Static Standing - Level of Assistance: 4: Min assist Static Standing - Comment/# of Minutes: 3 (pt. stood while RN reattached electrode)  End of Session PT - End of Session Activity Tolerance: Patient tolerated treatment well;Treatment limited secondary to medical complications (Comment);Other (comment) (dizziness during ambulation) Patient left: in bed;with call bell/phone within reach;with family/visitor present;Other (comment) (pt. sat at EOB to eat lunch) Nurse Communication: Mobility status   Ferman Hamming 06/06/2011, 12:04 PM Acute Rehabilitation Services [phone] (pager)

## 2011-06-06 NOTE — Progress Notes (Signed)
Pt was resting in bed and stated he felt dizzy. He stated his dizziness started after he went to the bathroom. Pt denied any chest tightness or SOB. Pt's BP was 96/64 checked manually. Pt's HR was in the 70's Paced. Pa on call made aware new order received for a 250 cc bolus. Will cont to monitor pt.

## 2011-06-06 NOTE — Progress Notes (Signed)
Appreciate [facility] assistance, pt discussed with Adam [name] earlier today and he accepted him in transfer to Dr [employer] service. Please call as needed. Donnalee Curry MD [phone]

## 2011-06-06 NOTE — Consult Note (Addendum)
Reason for Consult: chest pain, dizziness Referring Physician:   LUSTER HECHLER is an 78 y.o. male.  HPI:   The patient is an 78 year old Caucasian male with history of coronary artery bypass grafting in 1996. He had a bare-metal stent placed to the saphenous vein graft to right coronary artery in June 2001.  He has a dual-chamber permanent pacemaker which was implanted for sinus node dysfunction and has frequent ventricular pacing due to intraventricular conduction disease. He has had issues with dizziness over the past several months and mild orthostatic hypotension.  Patient takes torsemide as needed but apparently has been taking it up to 4 times per week. He has not been weighing himself on a regular basis and he does not drink a lot of fluids according to his daughter.  Patient's history also includes congestive heart failure with an EF of 45-50%, hypothyroidism, hyperlipidemia, gastroesophageal reflux disease, hiatal hernia, insomnia.  Patient reports that he was awakened at 0300 hours this morning with nausea and chest tightness as well as dizziness.  He also reports LEE.  Patient states that on the 2 previous occasions raise the myocardial infarction has had no instances of chest pain. He does have nitroglycerin sublingual at home however he did not take any of the onset of chest pain this morning.  He denies radiation of chest pain, shortness of breath, vomiting, abdominal pain, blurry vision double vision, cough, congestion, fever.  His chest pain is currently resolved.    Past Medical History  Diagnosis Date  . CAD (coronary artery disease)     CABG (LIMA-LAD, SVG-RCA, SVG-OM in 1996).  07/2009 BMS to SVG-RCA. Cath in 04/2010 with patent stents   . Bradycardia     AFib/SSS s/p [employer] Jude PPM  . Hypothyroid   . Pacemaker   . Hyperlipidemia   . GERD (gastroesophageal reflux disease)   . Hiatal hernia   . Insomnia   . S/P CABG x 4     History reviewed. No pertinent past surgical  history.  History reviewed. No pertinent family history.  Social History:  reports that he has never smoked. He does not have any smokeless tobacco history on file. He reports that he does not drink alcohol or use illicit drugs.  Allergies:  Allergies  Allergen Reactions  . Penicillins Rash  . Sulfa Antibiotics Rash    Medications: Scheduled Meds:   . aspirin EC  325 mg Oral Daily  . clopidogrel  75 mg Oral Daily  . docusate sodium  100 mg Oral BID  . heparin  5,000 Units Subcutaneous Q8H  . levothyroxine  100 mcg Oral QAC breakfast  . metoprolol tartrate  25 mg Oral BID  . senna  1 tablet Oral BID  . simvastatin  40 mg Oral q1800  . sodium chloride  3 mL Intravenous Q12H  . sodium chloride  3 mL Intravenous Q12H  . torsemide  20 mg Oral 4 times weekly   Continuous Infusions:  PRN Meds:.sodium chloride, acetaminophen, acetaminophen, morphine, nitroGLYCERIN, ondansetron (ZOFRAN) IV, ondansetron, sodium chloride, zolpidem   Results for orders placed during the hospital encounter of 06/05/11 (from the past 48 hour(s))  CBC     Status: Abnormal   Collection Time   06/05/11  5:31 PM      Component Value Range Comment   WBC 7.7  4.0 - 10.5 (K/uL)    RBC 4.01 (*) 4.22 - 5.81 (MIL/uL)    Hemoglobin 12.7 (*) 13.0 - 17.0 (g/dL)    HCT  37.8 (*) 39.0 - 52.0 (%)    MCV 94.3  78.0 - 100.0 (fL)    MCH 31.7  26.0 - 34.0 (pg)    MCHC 33.6  30.0 - 36.0 (g/dL)    RDW 16.1  09.6 - 04.5 (%)    Platelets 199  150 - 400 (K/uL)   BASIC METABOLIC PANEL     Status: Abnormal   Collection Time   06/05/11  5:31 PM      Component Value Range Comment   Sodium 136  135 - 145 (mEq/L)    Potassium 4.0  3.5 - 5.1 (mEq/L)    Chloride 103  96 - 112 (mEq/L)    CO2 26  19 - 32 (mEq/L)    Glucose, Bld 97  70 - 99 (mg/dL)    BUN 19  6 - 23 (mg/dL)    Creatinine, Ser 4.09  0.50 - 1.35 (mg/dL)    Calcium 8.6  8.4 - 10.5 (mg/dL)    GFR calc non Af Amer 79 (*) >90 (mL/min)    GFR calc Af Amer >90  >90  (mL/min)   POCT I-STAT TROPONIN I     Status: Normal   Collection Time   06/05/11  5:41 PM      Component Value Range Comment   Troponin i, poc 0.02  0.00 - 0.08 (ng/mL)    Comment 3            POCT I-STAT TROPONIN I     Status: Normal   Collection Time   06/05/11  6:41 PM      Component Value Range Comment   Troponin i, poc 0.00  0.00 - 0.08 (ng/mL)    Comment 3            CARDIAC PANEL(CRET KIN+CKTOT+MB+TROPI)     Status: Normal   Collection Time   06/05/11 10:35 PM      Component Value Range Comment   Total CK 118  7 - 232 (U/L)    CK, MB 2.8  0.3 - 4.0 (ng/mL)    Troponin I <0.30  <0.30 (ng/mL)    Relative Index 2.4  0.0 - 2.5    CARDIAC PANEL(CRET KIN+CKTOT+MB+TROPI)     Status: Abnormal   Collection Time   06/06/11  5:30 AM      Component Value Range Comment   Total CK 105  7 - 232 (U/L)    CK, MB 2.7  0.3 - 4.0 (ng/mL)    Troponin I <0.30  <0.30 (ng/mL)    Relative Index 2.6 (*) 0.0 - 2.5    BASIC METABOLIC PANEL     Status: Abnormal   Collection Time   06/06/11  5:30 AM      Component Value Range Comment   Sodium 139  135 - 145 (mEq/L)    Potassium 3.5  3.5 - 5.1 (mEq/L)    Chloride 106  96 - 112 (mEq/L)    CO2 26  19 - 32 (mEq/L)    Glucose, Bld 88  70 - 99 (mg/dL)    BUN 17  6 - 23 (mg/dL)    Creatinine, Ser 8.11  0.50 - 1.35 (mg/dL)    Calcium 8.4  8.4 - 10.5 (mg/dL)    GFR calc non Af Amer 82 (*) >90 (mL/min)    GFR calc Af Amer >90  >90 (mL/min)   CBC     Status: Abnormal   Collection Time   06/06/11  5:30 AM  Component Value Range Comment   WBC 5.4  4.0 - 10.5 (K/uL)    RBC 3.92 (*) 4.22 - 5.81 (MIL/uL)    Hemoglobin 12.2 (*) 13.0 - 17.0 (g/dL)    HCT 57.8 (*) 46.9 - 52.0 (%)    MCV 94.9  78.0 - 100.0 (fL)    MCH 31.1  26.0 - 34.0 (pg)    MCHC 32.8  30.0 - 36.0 (g/dL)    RDW 62.9  52.8 - 41.3 (%)    Platelets 197  150 - 400 (K/uL)     Dg Chest Port 1 View  06/05/2011  *RADIOLOGY REPORT*  Clinical Data: Chest pain and dizziness  PORTABLE CHEST - 1 VIEW   Comparison: 04/05/2010  Findings: Left chest wall pacer device is noted with lead in the right atrial appendage and right ventricle.  Prior median sternotomy and CABG procedure.  Mild cardiac enlargement.  Large hiatal hernia is noted.  No pleural effusion or edema.  IMPRESSION:  1.  No acute cardiopulmonary abnormalities.  Original Report Authenticated By: Rosealee Albee, M.D.    Review of Systems  Constitutional: Negative for fever and diaphoresis.  HENT: Negative for congestion and sore throat.   Eyes: Negative for blurred vision and double vision.  Respiratory: Negative for cough.   Cardiovascular: Positive for chest pain and leg swelling. Negative for palpitations, orthopnea and PND.  Gastrointestinal: Positive for nausea. Negative for vomiting, abdominal pain and diarrhea.  Neurological: Positive for dizziness. Negative for headaches.   Blood pressure 152/86, pulse 76, temperature 97.6 F (36.4 C), temperature source Oral, resp. rate 18, height 5\' 11"  (1.803 m), weight 85.639 kg (188 lb 12.8 oz), SpO2 96.00%. Physical Exam  Constitutional: He is oriented to person, place, and time. He appears well-developed and well-nourished. No distress.  HENT:  Head: Normocephalic and atraumatic.  Eyes: EOM are normal. Pupils are equal, round, and reactive to light. No scleral icterus.  Neck: Normal range of motion. Neck supple. No JVD present.  Cardiovascular: Normal rate and regular rhythm.   No murmur heard. Pulses:      Radial pulses are 2+ on the right side, and 2+ on the left side.       Dorsalis pedis pulses are 2+ on the right side, and 2+ on the left side.       No carotid bruits  Respiratory: Effort normal and breath sounds normal. He has no wheezes. He has no rales.  GI: Soft. Bowel sounds are normal. There is no tenderness.  Musculoskeletal:       1+ lower extremity edema  Lymphadenopathy:    He has no cervical adenopathy.  Neurological: He is alert and oriented to person,  place, and time. He exhibits normal muscle tone.  Skin: Skin is warm and dry.  Psychiatric: He has a normal mood and affect.    Assessment/Plan: Patient Active Hospital Problem List: CAD (coronary artery disease) () Bradycardia () Chest tightness (06/05/2011) Nausea (06/05/2011) Dizziness (06/05/2011)  Plan:  Cardiac enzymes have been negative thus far. Patient is not orthostatic, however, I question whether his intravascular volume is a little low.  SCr is ok.  Interrogation of his PPM shows no atrial or ventricular arrhythmia.  ? Need for nuclear stress test considering his history.  The last one may have been June 2012 but last office note says 2011, the same June as last cath.  Records are on the way.   HAGER,BRYAN W 06/06/2011, 11:17 AM   Patient seen and examined. Agree  with assessment and plan.  Very pleasant 78 yo gentleman who is 17 yrs s/p CABG.  He is s/p permanent pacemaker, and underwent PCI/stent to SVG to RCA.  He states that he does not have typical chest pain. He was awakeneneed with chest pain leading to current presentation.  No h/o sleep apnea.  Pacemaker interrogation did not disclose any significant arrhythmia. No acute ST changes. In light of symptoms and remote CABG with previous PCI to graft recommend nuclear perfusion study to make certain this is not high risk revealing significant ischemia.  Will try to arrange in am if schedules allows. Will add low dose nitrates to medical regimen.   Lennette Bihari, MD, [facility] 06/06/2011 12:46 PM

## 2011-06-06 NOTE — Care Management Note (Unsigned)
    Page 1 of 1   06/06/2011     3:03:11 PM   CARE MANAGEMENT NOTE 06/06/2011  Patient:  LEEANDRE, NORDLING   Account Number:  [id number]  Date Initiated:  06/06/2011  Documentation initiated by:  [facility]  Subjective/Objective Assessment:   Admitted with chest pain.Lives with spouse.     Action/Plan:   Pt eval-recommending HHPT and rolling walker   Anticipated DC Date:  06/06/2011   Anticipated DC Plan:  HOME W HOME HEALTH SERVICES      DC Planning Services  CM consult      Choice offered to / List presented to:  C-1 Patient        HH arranged  HH-2 PT      Status of service:  In process, will continue to follow Medicare Important Message given?   (If response is "NO", the following Medicare IM given date fields will be blank) Date Medicare IM given:   Date Additional Medicare IM given:    Discharge Disposition:    Per UR Regulation:    If discussed at Long Length of Stay Meetings, dates discussed:    Comments:  PCP Dr. Juline Patch  06/06/11 Spoke with patient and his daughter regarding HHC. They chose [facility] from [facility] list for HHPT. Patient does not have a rolling walker, agreeable to getting one. Contacted Marie at [facility] and requested HHPT. Will request rolling wlaker once order received. Will continue to follow for d/c needs. Jacquelynn Cree RN, [facility]

## 2011-06-07 DIAGNOSIS — I959 Hypotension, unspecified: Secondary | ICD-10-CM | POA: Diagnosis not present

## 2011-06-07 LAB — GLUCOSE, CAPILLARY: Glucose-Capillary: 95 mg/dL (ref 70–99)

## 2011-06-07 MED ORDER — ZOLPIDEM TARTRATE 5 MG PO TABS
2.5000 mg | ORAL_TABLET | Freq: Every evening | ORAL | Status: DC | PRN
  Administered 2011-06-07: 2.5 mg via ORAL
  Filled 2011-06-07: qty 1

## 2011-06-07 NOTE — Progress Notes (Signed)
Subjective: Episode of dizziness last pm with BP in 90's systolic, rec'd fluid bolus with improvement of symptoms.(did receive Torsemide yesterday, he takes 4 times a week at home)  Objective: Vital signs in last 24 hours: Temp:  [97.5 F (36.4 C)-97.6 F (36.4 C)] 97.6 F (36.4 C) (05/03 0700) Pulse Rate:  [70-88] 70  (05/03 0700) Resp:  [18-20] 18  (05/03 0700) BP: (97-152)/(50-86) 113/68 mmHg (05/03 0700) SpO2:  [95 %-97 %] 97 % (05/03 0700) Weight change:  Last BM Date: 06/06/11 Intake/Output from previous day: +840 05/02 0701 - 05/03 0700 In: 840 [P.O.:840] Out: -  Intake/Output this shift:    PE: General:alert and oriented, no lightheadedness, no nausea no pain Heart:S1S2 RRR,  Lungs:clear without rales, rhonchi and wheezes Abd:+ BS soft non tender Ext:no edema Neuro:alert and oriented  Wt. On admit 85.6   Lab Results:  Basename 06/06/11 0530 06/05/11 1731  WBC 5.4 7.7  HGB 12.2* 12.7*  HCT 37.2* 37.8*  PLT 197 199   BMET  Basename 06/06/11 0530 06/05/11 1731  NA 139 136  K 3.5 4.0  CL 106 103  CO2 26 26  GLUCOSE 88 97  BUN 17 19  CREATININE 0.78 0.85  CALCIUM 8.4 8.6    Basename 06/06/11 1327 06/06/11 0530  TROPONINI <0.30 <0.30    Lab Results  Component Value Date   CHOL  Value: 93        ATP III CLASSIFICATION:  <200     mg/dL   Desirable  161-096  mg/dL   Borderline High  >=045    mg/dL   High        05/13/8117   HDL 34* 09/19/2009   LDLCALC  Value: 39        Total Cholesterol/HDL:CHD Risk Coronary Heart Disease Risk Table                     Men   Women  1/2 Average Risk   3.4   3.3  Average Risk       5.0   4.4  2 X Average Risk   9.6   7.1  3 X Average Risk  23.4   11.0        Use the calculated Patient Ratio above and the CHD Risk Table to determine the patient's CHD Risk.        ATP III CLASSIFICATION (LDL):  <100     mg/dL   Optimal  147-829  mg/dL   Near or Above                    Optimal  130-159  mg/dL   Borderline  562-130  mg/dL   High   >865     mg/dL   Very High 7/84/6962   TRIG 98 09/19/2009   CHOLHDL 2.7 09/19/2009   Lab Results  Component Value Date   HGBA1C  Value: 5.7 (NOTE)                                                                       According to the ADA Clinical Practice Recommendations for 2011, when HbA1c is used as a screening test:   >=6.5%   Diagnostic of Diabetes Mellitus           (  if abnormal result  is confirmed)  5.7-6.4%   Increased risk of developing Diabetes Mellitus  References:Diagnosis and Classification of Diabetes Mellitus,Diabetes Care,2011,34(Suppl 1):S62-S69 and Standards of Medical Care in         Diabetes - 2011,Diabetes Care,2011,34  (Suppl 1):S11-S61.* 09/19/2009     Lab Results  Component Value Date   TSH 2.991 09/19/2009    Hepatic Function Panel No results found for this basename: PROT,ALBUMIN,AST,ALT,ALKPHOS,BILITOT,BILIDIR,IBILI in the last 72 hours No results found for this basename: CHOL in the last 72 hours No results found for this basename: PROTIME in the last 72 hours    EKG: Orders placed during the hospital encounter of 06/05/11  . EKG 12-LEAD  . EKG 12-LEAD  . EKG 12-LEAD  . EKG 12-LEAD  . EKG 12-LEAD    Studies/Results: Dg Chest Port 1 View  06/05/2011  *RADIOLOGY REPORT*  Clinical Data: Chest pain and dizziness  PORTABLE CHEST - 1 VIEW  Comparison: 04/05/2010  Findings: Left chest wall pacer device is noted with lead in the right atrial appendage and right ventricle.  Prior median sternotomy and CABG procedure.  Mild cardiac enlargement.  Large hiatal hernia is noted.  No pleural effusion or edema.  IMPRESSION:  1.  No acute cardiopulmonary abnormalities.  Original Report Authenticated By: Rosealee Albee, M.D.    Medications: I have reviewed the patient's current medications.    Marland Kitchen aspirin EC  325 mg Oral Daily  . clopidogrel  75 mg Oral Daily  . docusate sodium  100 mg Oral BID  . heparin  5,000 Units Subcutaneous Q8H  . isosorbide mononitrate  30 mg Oral  Daily  . levothyroxine  100 mcg Oral QAC breakfast  . metoprolol tartrate  25 mg Oral BID  . senna  1 tablet Oral BID  . simvastatin  40 mg Oral q1800  . sodium chloride  250 mL Intravenous Once  . sodium chloride  3 mL Intravenous Q12H  . sodium chloride  3 mL Intravenous Q12H  . DISCONTD: torsemide  20 mg Oral 4 times weekly   Assessment/Plan: Patient Active Problem List  Diagnoses  . CAD (coronary artery disease)  . Bradycardia  . Chest tightness  . Nausea  . Dizziness  . Hypotension, resolved with fluid bolus   PLAN:The patient is an 78 year old Caucasian male with history of coronary artery bypass grafting in 1996. He had a bare-metal stent placed to the saphenous vein graft to right coronary artery in June 2001. He has a dual-chamber permanent pacemaker which was implanted for sinus node dysfunction and has frequent ventricular pacing due to intraventricular conduction disease. He has had issues with dizziness over the past several months and mild orthostatic hypotension. Patient takes torsemide as needed but apparently has been taking it up to 4 times per week. He has not been weighing himself on a regular basis and he does not drink a lot of fluids according to his daughter. Patient's history also includes congestive heart failure with an EF of 45-50%, hypothyroidism, hyperlipidemia, gastroesophageal reflux disease, hiatal hernia, insomnia--He presented with chest tightness and nausea and dizziness.  Cardiac enzymes are neg. Nuc study for AM, could not get on schedule today.  Pt is asking if he could be d/c'd and do out pt nuc.   LOS: 2 days   Jaielle Dlouhy R 06/07/2011, 9:28 AM

## 2011-06-07 NOTE — Progress Notes (Signed)
PT Cancellation Note     Treatment cancelled today due to patient's polite refusal to participate due to dizziness and feeling tired.  06/07/2011  [place] Bing, PT [phone] (pager)

## 2011-06-07 NOTE — Progress Notes (Signed)
Pt. Seen and examined. Agree with the NP/PA-C note as written. Dizziness overnight .Marland Kitchen He says this is the same as his ACS presentation twice in the past, however, no new EKG changes or + enzymes. He recently had eppley maneuvers which did not help. He may need an outpatient neurology evaluation. NST scheduled for tomorrow. If negative, can be safely discharged home.  Chrystie Nose, MD, [facility] Attending Cardiologist [facility]

## 2011-06-07 NOTE — Progress Notes (Signed)
After fluid bolus pt stated he felt much better and denied any dizziness. Will cont to monitor pt.

## 2011-06-08 ENCOUNTER — Observation Stay (HOSPITAL_COMMUNITY): Payer: Medicare Other

## 2011-06-08 LAB — GLUCOSE, CAPILLARY: Glucose-Capillary: 88 mg/dL (ref 70–99)

## 2011-06-08 LAB — BASIC METABOLIC PANEL
BUN: 14 mg/dL (ref 6–23)
CO2: 26 mEq/L (ref 19–32)
Calcium: 8.7 mg/dL (ref 8.4–10.5)
Chloride: 104 mEq/L (ref 96–112)
Creatinine, Ser: 0.72 mg/dL (ref 0.50–1.35)
GFR calc Af Amer: >90 mL/min (ref >90)
GFR calc non Af Amer: 85 mL/min — ABNORMAL LOW (ref >90)
Glucose, Bld: 99 mg/dL (ref 70–99)
Potassium: 3.5 mEq/L (ref 3.5–5.1)
Sodium: 139 mEq/L (ref 135–145)

## 2011-06-08 MED ORDER — TECHNETIUM TC 99M TETROFOSMIN IV KIT
10.0000 | PACK | Freq: Once | INTRAVENOUS | Status: AC | PRN
  Administered 2011-06-08: 10 via INTRAVENOUS

## 2011-06-08 MED ORDER — REGADENOSON 0.4 MG/5ML IV SOLN
0.4000 mg | Freq: Once | INTRAVENOUS | Status: AC
  Administered 2011-06-08: 0.4 mg via INTRAVENOUS

## 2011-06-08 MED ORDER — TECHNETIUM TC 99M TETROFOSMIN IV KIT
30.0000 | PACK | Freq: Once | INTRAVENOUS | Status: AC | PRN
  Administered 2011-06-08: 30 via INTRAVENOUS

## 2011-06-08 MED ORDER — NITROGLYCERIN 0.4 MG SL SUBL: 0.4000 mg | SUBLINGUAL_TABLET | SUBLINGUAL | Status: DC | PRN

## 2011-06-08 NOTE — Discharge Instructions (Signed)
Heart healthy diet  Call if further symptoms develop.

## 2011-06-08 NOTE — Progress Notes (Signed)
Subjective: No complaints, still dizzy at times  Objective: Vital signs in last 24 hours: Temp:  [97.5 F (36.4 C)-98.5 F (36.9 C)] 98.5 F (36.9 C) (05/04 0500) Pulse Rate:  [70-75] 70  (05/04 0923) Resp:  [17-20] 20  (05/04 0500) BP: (93-135)/(52-86) 110/76 mmHg (05/04 0923) SpO2:  [94 %-95 %] 95 % (05/04 0500) Weight change:  Last BM Date: 06/06/11 Intake/Output from previous day: +480 05/03 0701 - 05/04 0700 In: 480 [P.O.:480] Out: -  Intake/Output this shift:    PE: General:alert and oriented X 3 pleasant affect Heart:S1S2 RRR with premature beats.  PVCs Lungs:clear without rales rhonchi or wheezes Abd:+ BS soft, non tender Ext:no edema + 1 pedal pulse    Lab Results:  Basename 06/06/11 0530 06/05/11 1731  WBC 5.4 7.7  HGB 12.2* 12.7*  HCT 37.2* 37.8*  PLT 197 199   BMET  Basename 06/08/11 0610 06/06/11 0530  NA 139 139  K 3.5 3.5  CL 104 106  CO2 26 26  GLUCOSE 99 88  BUN 14 17  CREATININE 0.72 0.78  CALCIUM 8.7 8.4    Basename 06/06/11 1327 06/06/11 0530  TROPONINI <0.30 <0.30    Lab Results  Component Value Date   CHOL  Value: 93        ATP III CLASSIFICATION:  <200     mg/dL   Desirable  409-811  mg/dL   Borderline High  >=914    mg/dL   High        7/82/9562   HDL 34* 09/19/2009   LDLCALC  Value: 39        Total Cholesterol/HDL:CHD Risk Coronary Heart Disease Risk Table                     Men   Women  1/2 Average Risk   3.4   3.3  Average Risk       5.0   4.4  2 X Average Risk   9.6   7.1  3 X Average Risk  23.4   11.0        Use the calculated Patient Ratio above and the CHD Risk Table to determine the patient's CHD Risk.        ATP III CLASSIFICATION (LDL):  <100     mg/dL   Optimal  130-865  mg/dL   Near or Above                    Optimal  130-159  mg/dL   Borderline  784-696  mg/dL   High  >295     mg/dL   Very High 2/84/1324   TRIG 98 09/19/2009   CHOLHDL 2.7 09/19/2009   Lab Results  Component Value Date   HGBA1C  Value: 5.7 (NOTE)                                                                        According to the ADA Clinical Practice Recommendations for 2011, when HbA1c is used as a screening test:   >=6.5%   Diagnostic of Diabetes Mellitus           (if abnormal result  is confirmed)  5.7-6.4%   Increased risk of  developing Diabetes Mellitus  References:Diagnosis and Classification of Diabetes Mellitus,Diabetes Care,2011,34(Suppl 1):S62-S69 and Standards of Medical Care in         Diabetes - 2011,Diabetes Care,2011,34  (Suppl 1):S11-S61.* 09/19/2009     Lab Results  Component Value Date   TSH 2.991 09/19/2009    Hepatic Function Panel No results found for this basename: PROT,ALBUMIN,AST,ALT,ALKPHOS,BILITOT,BILIDIR,IBILI in the last 72 hours No results found for this basename: CHOL in the last 72 hours No results found for this basename: PROTIME in the last 72 hours    EKG: Orders placed during the hospital encounter of 06/05/11  . EKG 12-LEAD  . EKG 12-LEAD  . EKG 12-LEAD  . EKG 12-LEAD  . EKG 12-LEAD    Studies/Results: No results found.  Medications: I have reviewed the patient's current medications.    Marland Kitchen aspirin EC  325 mg Oral Daily  . clopidogrel  75 mg Oral Daily  . docusate sodium  100 mg Oral BID  . heparin  5,000 Units Subcutaneous Q8H  . isosorbide mononitrate  30 mg Oral Daily  . levothyroxine  100 mcg Oral QAC breakfast  . metoprolol tartrate  25 mg Oral BID  . regadenoson  0.4 mg Intravenous Once  . senna  1 tablet Oral BID  . simvastatin  40 mg Oral q1800  . sodium chloride  3 mL Intravenous Q12H  . sodium chloride  3 mL Intravenous Q12H   Assessment/Plan: Patient Active Problem List  Diagnoses  . CAD (coronary artery disease)  . Bradycardia  . Chest tightness  . Nausea  . Dizziness  . Hypotension, resolved with fluid bolus   PLAN:  Completed Lexiscan portion of stress test, PVCs prior to during and after test.  BP down to 93 systolic with associated dizziness.  No complications.   Nuc results pending.     LOS: 3 days   INGOLD,LAURA R 06/08/2011, 9:32 AM   I have seen and examined the patient along with [facility] R, NP.  I have reviewed the chart, notes and new data.  I agree with NP's note.  Key new complaints: occasional dizziness Key examination changes: none Key new findings / data: for myocardial perfusion study today  PLAN: DC home if perfusion study is normal.  Thurmon Fair, MD, [facility] [phone] 06/08/2011, 9:58 AM

## 2011-06-09 NOTE — Discharge Summary (Signed)
Physician Discharge Summary  Patient ID: Adam [name] MRN: [id number] DOB/AGE: 76-Feb-1928 76 y.o.  Admit date: 06/05/2011 Discharge date: 06/08/2011 Discharge Diagnoses:  Principal Problem:  *Chest tightness, negative MI, Negative Lexiscan myoview Active Problems:  CAD (coronary artery disease)  Bradycardia  Nausea  Dizziness, after diuretic asscoiated with hypotension and responded to fluid bolus  Hypotension, resolved with fluid bolus   Discharged Condition: good  Hospital Course: 78yoM with h/o CABG (LIMA-LAD, SVG-RCA, SVG-OM in 1996), then 07/2009 BMS to SVG-RCA, cath in  04/2010 with patent grafts, AFib/SSS s/p [employer] PPM presented 06/05/11 with sudden onset nausea, dizziness, chest tightness.   Pt was last admitted in 04/2010 with dizziness and chest discomfort and had cath with patent  grafts, nothing to intervene on, medically managed. Then seen again 07/2010 with dizziness, sent to  ED where interrogation showed PVC's, and BP was 98/50. Metoprolol was decreased to 12.5 BID and he  was sent home from ED to f/u with Dr. Royann Shivers. Before that, in 09/2009 he was admitted for  dizziness and chest discomfort, and his prior cardiologist Dr. Lynnea Ferrier notes that his dizziness  had been an intermittent problem for some time, that had previously responded to medication  adjustment, and during that admission they stopped losartan and decreased metoprolol, added imdur.   Patient was seen in the ED by the hospitalist and admitted to telemetry. His aspirin and Plavix were continued in addition to this torsemide.  His pacemaker was interrogated and revealed no atrial or ventricular arrhythmias.  He was seen by Dr. [name] in consult EKG without acute ST changes and cardiac enzymes are negative. Plan was to do [employer].  That night after he received torsemide patient became very dizzy blood pressure was 97/50.  He was given 250 cc fluid bolus with increase of blood pressure to 139/71 and  resolution of the dizziness.  Torsemide was DC'd.  The patient underwent Lexiscan Myoview during the procedure patient complained of dizziness his blood pressure that time was 93 systolic.  Once the drug wore off patient's blood pressure came up and the dizziness resolved.  Lexi scan Myoview results:  Prior infarct involving the left lateral ventricular wall with  possible small amount of peri infarct ischemia.  2. Mild dilatation of the left ventricle with mild global  hypokinesia and geographic area of akinesia involving the left  lateral wall at the site of prior infarction.  3. Increased activity with the right lateral ventricle,  nonspecific but may be seen in the setting of right ventricle  hypertrophy.  Above findings discussed with Dr. Rennis Golden at 1044.   Dr. Rennis Golden and Dr. Royann Shivers discuss the results and felt there was not significant change from previous stress test in our office.  It was felt patient was stable and could be discharged home.  He will followup within a week with Dr. Royann Shivers for further management.     Consults: None  Significant Diagnostic Studies:  11 discharge sodium 139 potassium 3.5 chloride 104 CO2 26 BUN 14 creatinine 0.72  Calcium 8.7  Cardiac enzymes negative x3 troponin<0.30 times per  Hemoglobin 12.7 hematocrit 37.2 WBC 5.4 and platelets 197.  Chest x-ray on admission no acute cardiopulmonary process.  EKG on admission and atrial pacing ventricular sensing with occasional PVCs.    Discharge Exam: Blood pressure 110/76, pulse 70, temperature 98.5 F (36.9 C), temperature source Oral, resp. rate 20, height 5\' 11"  (1.803 m), weight 85.639 kg (188 lb 12.8 oz), SpO2 95.00%.  PE: General:alert and oriented X 3 pleasant affect  Heart:S1S2 RRR with premature beats. PVCs  Lungs:clear without rales rhonchi or wheezes  Abd:+ BS soft, non tender  Ext:no edema + 1 pedal pulse  Disposition: 01-Home or Self Care   Medication List  As of 06/09/2011  10:37 AM   STOP taking these medications         torsemide 20 MG tablet         TAKE these medications         aspirin 81 MG chewable tablet   Chew 81 mg by mouth daily.      clobetasol cream 0.05 %   Commonly known as: TEMOVATE   Apply topically daily as needed.      clopidogrel 75 MG tablet   Commonly known as: PLAVIX   Take 75 mg by mouth daily.      diazepam 5 MG tablet   Commonly known as: VALIUM   Take 5 mg by mouth daily as needed. For anxiety      levothyroxine 100 MCG tablet   Commonly known as: SYNTHROID, LEVOTHROID   Take 100 mcg by mouth daily.      metoprolol tartrate 25 MG tablet   Commonly known as: LOPRESSOR   Take 25 mg by mouth 2 (two) times daily.      nitroGLYCERIN 0.4 MG SL tablet   Commonly known as: NITROSTAT   Place 1 tablet (0.4 mg total) under the tongue every 5 (five) minutes x 3 doses as needed for chest pain.      pantoprazole 40 MG tablet   Commonly known as: PROTONIX   Take 40 mg by mouth daily.      pravastatin 80 MG tablet   Commonly known as: PRAVACHOL   Take 40 mg by mouth daily.      VITAMIN D MAINTENANCE PO   Take 5,000 Units by mouth daily.      zolpidem 5 MG tablet   Commonly known as: AMBIEN   Take 2.5 mg by mouth at bedtime as needed. For sleep           Follow-up Information    Follow up with Thurmon Fair, MD. (Our office will call you Monday and you will See Dr. Royann Shivers this coming week.)    Contact information:   [address] [place] [address] [phone]        Discharge instructions:  Heart healthy diet  Call if further symptoms develop.  SignedLeone Brand 06/09/2011, 10:37 AM

## 2011-06-17 NOTE — ED Provider Notes (Signed)
I saw and evaluated the patient, reviewed the resident's note and I agree with the findings and plan.   Loren Racer, MD 06/17/11 [phone]

## 2012-04-14 ENCOUNTER — Other Ambulatory Visit (HOSPITAL_COMMUNITY): Payer: Self-pay | Admitting: Cardiology

## 2012-04-14 DIAGNOSIS — R079 Chest pain, unspecified: Secondary | ICD-10-CM

## 2012-04-14 DIAGNOSIS — I25810 Atherosclerosis of coronary artery bypass graft(s) without angina pectoris: Secondary | ICD-10-CM

## 2012-04-14 DIAGNOSIS — Z951 Presence of aortocoronary bypass graft: Secondary | ICD-10-CM

## 2012-04-15 ENCOUNTER — Ambulatory Visit (HOSPITAL_COMMUNITY)
Admission: RE | Admit: 2012-04-15 | Discharge: 2012-04-15 | Disposition: A | Discharge status: Home / Self Care | Payer: Medicare Other | Source: Ambulatory Visit | Attending: Cardiology | Admitting: Cardiology

## 2012-04-15 DIAGNOSIS — Z951 Presence of aortocoronary bypass graft: Secondary | ICD-10-CM

## 2012-04-15 DIAGNOSIS — R079 Chest pain, unspecified: Secondary | ICD-10-CM | POA: Insufficient documentation

## 2012-04-15 DIAGNOSIS — I25810 Atherosclerosis of coronary artery bypass graft(s) without angina pectoris: Secondary | ICD-10-CM

## 2012-04-15 MED ORDER — TECHNETIUM TC 99M SESTAMIBI GENERIC - CARDIOLITE
30.0000 | Freq: Once | INTRAVENOUS | Status: AC | PRN
  Administered 2012-04-15: 30 via INTRAVENOUS

## 2012-04-15 MED ORDER — AMINOPHYLLINE 25 MG/ML IV SOLN
75.0000 mg | Freq: Once | INTRAVENOUS | Status: AC
  Administered 2012-04-15: 75 mg via INTRAVENOUS

## 2012-04-15 MED ORDER — TECHNETIUM TC 99M SESTAMIBI GENERIC - CARDIOLITE
10.0000 | Freq: Once | INTRAVENOUS | Status: AC | PRN
  Administered 2012-04-15: 10 via INTRAVENOUS

## 2012-04-15 MED ORDER — REGADENOSON 0.4 MG/5ML IV SOLN
0.4000 mg | Freq: Once | INTRAVENOUS | Status: AC
  Administered 2012-04-15: 0.4 mg via INTRAVENOUS

## 2012-04-15 NOTE — Procedures (Addendum)
[name] [address] [place] [address] [place] [address] [phone]  Cardiology Nuclear Med Study  Adam [name] is a 79 y.o. male     MRN : [id number]     DOB: Nov 22, 1926  Procedure Date: 04/15/2012  Nuclear Med Background Indication for Stress Test:  Graft Patency, Stent Patency and PTCA Patency History:  CHF;CAD;MI;CABG X3-1996;STENT-07/11/09;PTCA-04/12/10 AND 07/11/09;PACER Cardiac Risk Factors: Family History - CAD, Lipids, RBBB and ISCHEMIC CARDIOMYOPATHY  Symptoms:  Chest Pain, Dizziness, DOE and SOB   Nuclear Pre-Procedure Caffeine/Decaff Intake:  1:00am NPO After: 11 AM   IV Site: R Antecubital  IV 0.9% NS with Angio Cath:  22g  Chest Size (in):  44"  IV Started by: Emmit Pomfret, RN  Height: 5\' 11"  (1.803 m)  Cup Size: n/a  BMI:  Body mass index is 25.26 kg/(m^2). Weight:  181 lb (82.101 kg)   Tech Comments:  N/A    Nuclear Med Study 1 or 2 day study: 1 day  Stress Test Type:  Lexiscan  Order Authorizing Provider:  Thurmon Fair, MD   Resting Radionuclide: Technetium 25m Sestamibi  Resting Radionuclide Dose: 9.5 mCi   Stress Radionuclide:  Technetium 35m Sestamibi  Stress Radionuclide Dose: 29.9 mCi           Stress Protocol Rest HR: 71 Stress HR: 72  Rest BP: 105/78 Stress BP: 137/78  Exercise Time (min): n/a METS: n/a          Dose of Adenosine (mg):  n/a Dose of Lexiscan: 0.4 mg  Dose of Atropine (mg): n/a Dose of Dobutamine: n/a mcg/kg/min (at max HR)  Stress Test Technologist: Ernestene Mention, CCT Nuclear Technologist: Koren Shiver, CNMT   Rest Procedure:  Myocardial perfusion imaging was performed at rest 45 minutes following the intravenous administration of Technetium 22m Sestamibi. Stress Procedure:  The patient received IV Lexiscan 0.4 mg over 15-seconds.  Technetium 53m Sestamibi injected at 30-seconds.  Due to patient's shortness of breath, dizziness and drop in blood pressure, he was given IV  Aminophylline 75 mg. Symptoms were resolved during recovery. There were no significant changes with Lexiscan.  Quantitative spect images were obtained after a 45 minute delay.  Transient Ischemic Dilatation (Normal <1.22):  1.10 Lung/Heart Ratio (Normal <0.45):  0.34 QGS EDV:  133 ml QGS ESV:  78 ml LV Ejection Fraction: 41%  Signed by      Rest ECG: NSR-RBBB  Stress ECG: No significant change from baseline ECG  QPS Raw Data Images:  Normal; no motion artifact; normal heart/lung ratio. Stress Images:  mild to moderate inferobasal perfusion defect Rest Images:  mild to moderate inferobasal perfusion defect Subtraction (SDS):  No evidence of ischemia.  Impression Exercise Capacity:  Lexiscan with no exercise. BP Response:  Normal blood pressure response. Clinical Symptoms:  No significant symptoms noted. ECG Impression:  No significant ST segment change suggestive of ischemia. Comparison with Prior Nuclear Study: No significant change from previous study  Overall Impression:  Low risk stress nuclear study. There is an inferolateral wall scar similar to prior studies.  LV Wall Motion:  EF 41 % by QGS with severe inferolateral hypokinesia   Runell Gess, MD  04/15/2012 4:31 PM

## 2012-05-30 ENCOUNTER — Other Ambulatory Visit: Payer: Self-pay | Admitting: Cardiovascular Disease

## 2012-05-30 LAB — PACEMAKER DEVICE OBSERVATION

## 2012-06-17 LAB — REMOTE PACEMAKER DEVICE
AL AMPLITUDE: 5 mv
AL IMPEDENCE PM: 530 Ohm
AL THRESHOLD: 0.5 V
ATRIAL PACING PM: 86
BAMS-0001: 150 {beats}/min
BATTERY VOLTAGE: 2.93 V
DEVICE MODEL PM: 2173363
RV LEAD AMPLITUDE: 12 mv
RV LEAD IMPEDENCE PM: 430 Ohm
RV LEAD THRESHOLD: 0.625 V
VENTRICULAR PACING PM: 42

## 2012-08-18 ENCOUNTER — Encounter: Payer: Self-pay | Admitting: *Deleted

## 2012-08-19 ENCOUNTER — Encounter: Payer: Self-pay | Admitting: Cardiovascular Disease

## 2012-08-19 ENCOUNTER — Other Ambulatory Visit: Payer: Self-pay | Admitting: Cardiovascular Disease

## 2012-08-19 ENCOUNTER — Ambulatory Visit (INDEPENDENT_AMBULATORY_CARE_PROVIDER_SITE_OTHER): Payer: Medicare Other | Admitting: Cardiovascular Disease

## 2012-08-19 VITALS — BP 102/70 | HR 70 | Ht 71.0 in | Wt 183.2 lb

## 2012-08-19 DIAGNOSIS — I2510 Atherosclerotic heart disease of native coronary artery without angina pectoris: Secondary | ICD-10-CM

## 2012-08-19 DIAGNOSIS — Z950 Presence of cardiac pacemaker: Secondary | ICD-10-CM

## 2012-08-19 DIAGNOSIS — I2589 Other forms of chronic ischemic heart disease: Secondary | ICD-10-CM

## 2012-08-19 DIAGNOSIS — I255 Ischemic cardiomyopathy: Secondary | ICD-10-CM

## 2012-08-19 DIAGNOSIS — I498 Other specified cardiac arrhythmias: Secondary | ICD-10-CM

## 2012-08-19 DIAGNOSIS — R001 Bradycardia, unspecified: Secondary | ICD-10-CM

## 2012-08-19 LAB — PACEMAKER DEVICE OBSERVATION
AL AMPLITUDE: 4.8 mv
AL IMPEDENCE PM: 490 Ohm
AL THRESHOLD: 0.75 V
ATRIAL PACING PM: 87
BAMS-0001: 150 {beats}/min
BATTERY VOLTAGE: 2.95 V
DEVICE MODEL PM: 2173363
RV LEAD AMPLITUDE: 12 mv
RV LEAD IMPEDENCE PM: 410 Ohm
RV LEAD THRESHOLD: 0.625 V
VENTRICULAR PACING PM: 49

## 2012-08-19 NOTE — Progress Notes (Signed)
In office pacemaker interrogation. Normal device function. No changes made this session. 

## 2012-08-19 NOTE — Patient Instructions (Addendum)
Your physician recommends that you schedule a follow-up appointment in: 6 months.  A remote pacemaker check will be done in 3 months.  No change in medications.

## 2012-08-25 DIAGNOSIS — I255 Ischemic cardiomyopathy: Secondary | ICD-10-CM | POA: Insufficient documentation

## 2012-08-25 DIAGNOSIS — Z950 Presence of cardiac pacemaker: Secondary | ICD-10-CM | POA: Insufficient documentation

## 2012-08-25 HISTORY — DX: Ischemic cardiomyopathy: I25.5

## 2012-08-25 NOTE — Assessment & Plan Note (Signed)
He is now 18 years status post bypass surgery and is graft dependent . His nuclear stress test in March of this year showed inferolateral scar and an ejection fraction of 41%. There was no reversible ischemia. He is currently free of angina and reasonably active. His last cardiac catheterization and March of 2012 showed occlusion of native coronary arteries, patent LIMA to LAD, patent SVG to oblique marginal, patent SVG to RCA (the latter treated with a bare-metal stent in June of 2011).

## 2012-08-25 NOTE — Assessment & Plan Note (Signed)
She has mild to moderate left ventricular systolic dysfunction with an EF of 41% by nuclear scintigraphy and 45% by echocardiography. There is evidence of grade 1 diastolic dysfunction. He requires intermittent diuretic therapy. Severe symptomatic orthostatic hypotension has led to discontinuation of beta blockers and ACE inhibitors. He does not have clinically evident manifestations of congestive heart failure.

## 2012-08-25 NOTE — Progress Notes (Signed)
Patient ID: Adam [name], male   DOB: April 06, 1926, 79 y.o.   MRN: [id number]     Reason for office visit CAD, ischemic cardio myopathy, pacemaker followup  Adam [name] has been well since his last appointment in March. He had a nuclear scintigraphy scan done which showed an inferolateral scar without ischemia and an ejection fraction of 41%, unchanged from previous evaluation. He has an extensive history of coronary disease and underwent bypass surgery 16 years ago. He is graft dependent and had patent bypasses to all 3 major coronary territories by cardiac catheterization in 2012. This included a widely patent stent in the saphenous vein graft bypass to the right coronary artery placed in 2011.  He denies angina, shortness of breath with usual activity and has not been troubled nearly as much by the severe orthostatic dizziness that he has had in the past. He has occasional ankle edema and takes a low dose of loop diuretic to 3 times a week.    Allergies  Allergen Reactions  . Altace (Ramipril)     Cough  . Penicillins Rash  . Sulfa Antibiotics Rash    Current Outpatient Prescriptions  Medication Sig Dispense Refill  . aspirin 81 MG chewable tablet Chew 81 mg by mouth daily.      . clobetasol cream (TEMOVATE) 0.05 % Apply topically daily as needed.      . clopidogrel (PLAVIX) 75 MG tablet Take 75 mg by mouth daily.      . diazepam (VALIUM) 5 MG tablet Take 5 mg by mouth daily as needed. For anxiety      . levothyroxine (SYNTHROID, LEVOTHROID) 100 MCG tablet Take 100 mcg by mouth daily.      . metoprolol tartrate (LOPRESSOR) 25 MG tablet Take 12.5 mg by mouth 2 (two) times daily.       . nitroGLYCERIN (NITROSTAT) 0.4 MG SL tablet Place 0.4 mg under the tongue every 5 (five) minutes x 3 doses as needed for chest pain.      . Nutritional Supplements (VITAMIN D MAINTENANCE PO) Take 5,000 Units by mouth daily.      . pantoprazole (PROTONIX) 40 MG tablet Take 40 mg by mouth daily.      .  pravastatin (PRAVACHOL) 80 MG tablet Take 40 mg by mouth daily.      Marland Kitchen zolpidem (AMBIEN) 5 MG tablet Take 2.5 mg by mouth at bedtime as needed. For sleep       No current facility-administered medications for this visit.    Past Medical History  Diagnosis Date  . CAD (coronary artery disease)     CABG (LIMA-LAD, SVG-RCA, SVG-OM in 1996).  07/2009 BMS to SVG-RCA. Cath in 04/2010 with patent stents   . Bradycardia     AFib/SSS s/p St Jude PPM 04/12/2008  . Hypothyroid   . Pacemaker   . Hyperlipidemia   . GERD (gastroesophageal reflux disease)   . Hiatal hernia   . Insomnia   . S/P CABG x 4   . RBBB     Past Surgical History  Procedure Laterality Date  . Coronary artery bypass graft  1996    LIMA to LAD,SVG to RCA & SVG to OM  . Coronary angioplasty with stent placement  07/2009    bare metal stent to SVG to the RCA  . Cardiac catheterization  04/2010    LIMA to LAD patent,SVG to OM patent,no in-stnet restenosis RCA  . US echocardiography  07/11/2009    EF 45-50%  .  Nm myoview ltd  06/2011    low risk    No family history on file.  History   Social History  . Marital Status: Married    Spouse Name: N/A    Number of Children: N/A  . Years of Education: N/A   Occupational History  . Not on file.   Social History Main Topics  . Smoking status: Former Smoker -- 7 years  . Smokeless tobacco: Never Used     Comment: Hasn't had a cigarette since he was 79 years old and even then it was occasional  . Alcohol Use: No  . Drug Use: No  . Sexually Active: Not on file   Other Topics Concern  . Not on file   Social History Narrative  . No narrative on file    Review of systems: The patient specifically denies any chest pain at rest or with exertion, dyspnea at rest or with exertion, orthopnea, paroxysmal nocturnal dyspnea, syncope, palpitations, focal neurological deficits, intermittent claudication, unexplained weight gain, cough, hemoptysis or wheezing.  The patient also  denies abdominal pain, nausea, vomiting, dysphagia, diarrhea, constipation, polyuria, polydipsia, dysuria, hematuria, frequency, urgency, abnormal bleeding or bruising, fever, chills, unexpected weight changes, mood swings, change in skin or hair texture, change in voice quality, auditory or visual problems, allergic reactions or rashes, new musculoskeletal complaints other than usual "aches and pains".   PHYSICAL EXAM BP 102/70  Pulse 70  Ht 5\' 11"  (1.803 m)  Wt 183 lb 3.2 oz (83.099 kg)  BMI 25.56 kg/m2  General: Alert, oriented x3, no distress Head: no evidence of trauma, PERRL, EOMI, no exophtalmos or lid lag, no myxedema, no xanthelasma; normal ears, nose and oropharynx Neck: normal jugular venous pulsations and no hepatojugular reflux; brisk carotid pulses without delay and no carotid bruits Chest: clear to auscultation, no signs of consolidation by percussion or palpation, normal fremitus, symmetrical and full respiratory excursions Cardiovascular: normal position and quality of the apical impulse, regular rhythm, normal first and widely split second heart sounds, nno rubs or gallops, grade 1/6 soft systolic murmur at the apex Abdomen: no tenderness or distention, no masses by palpation, no abnormal pulsatility or arterial bruits, normal bowel sounds, no hepatosplenomegaly Extremities: no clubbing, cyanosis or edema; 2+ radial, ulnar and brachial pulses bilaterally; 2+ right femoral, posterior tibial and dorsalis pedis pulses; 2+ left femoral, posterior tibial and dorsalis pedis pulses; no subclavian or femoral bruits Neurological: grossly nonfocal   EKG: Paced ventricular sensed rhythm with long AV delay, right bundle branch block, left anterior fascicular block, Q waves in the inferior leads as well as in the fourth of V6 consistent with extensive inferolateral infarction.  Lipid Panel     Component Value Date/Time   CHOL  Value: 93        ATP III CLASSIFICATION:  <200     mg/dL    Desirable  161-096  mg/dL   Borderline High  >=045    mg/dL   High        05/13/8117 0354   TRIG 98 09/19/2009 0354   HDL 34* 09/19/2009 0354   CHOLHDL 2.7 09/19/2009 0354   VLDL 20 09/19/2009 0354   LDLCALC  Value: 39        Total Cholesterol/HDL:CHD Risk Coronary Heart Disease Risk Table                     Men   Women  1/2 Average Risk   3.4   3.3  Average  Risk       5.0   4.4  2 X Average Risk   9.6   7.1  3 X Average Risk  23.4   11.0        Use the calculated Patient Ratio above and the CHD Risk Table to determine the patient's CHD Risk.        ATP III CLASSIFICATION (LDL):  <100     mg/dL   Optimal  478-295  mg/dL   Near or Above                    Optimal  130-159  mg/dL   Borderline  621-308  mg/dL   High  >657     mg/dL   Very High 8/46/9629 5284    BMET    Component Value Date/Time   NA 139 06/08/2011 0610   K 3.5 06/08/2011 0610   CL 104 06/08/2011 0610   CO2 26 06/08/2011 0610   GLUCOSE 99 06/08/2011 0610   BUN 14 06/08/2011 0610   CREATININE 0.72 06/08/2011 0610   CALCIUM 8.7 06/08/2011 0610   GFRNONAA 85* 06/08/2011 0610   GFRAA >90 06/08/2011 0610     ASSESSMENT AND PLAN CAD (coronary artery disease) He is now 18 years status post bypass surgery and is graft dependent . His nuclear stress test in March of this year showed inferolateral scar and an ejection fraction of 41%. There was no reversible ischemia. He is currently free of angina and reasonably active. His last cardiac catheterization and March of 2012 showed occlusion of native coronary arteries, patent LIMA to LAD, patent SVG to oblique marginal, patent SVG to RCA (the latter treated with a bare-metal stent in June of 2011).  Cardiomyopathy, ischemic She has mild to moderate left ventricular systolic dysfunction with an EF of 41% by nuclear scintigraphy and 45% by echocardiography. There is evidence of grade 1 diastolic dysfunction. He requires intermittent diuretic therapy. Severe symptomatic orthostatic hypotension has led to  discontinuation of beta blockers and ACE inhibitors. He does not have clinically evident manifestations of congestive heart failure.   Pacemaker St[employer] Jude Accent DR RF 2210 dual-chamber permanent pacemaker implanted March 2010 for sinus node dysfunction. Full interrogation of device is performed today including active testing the pacing thresholds. There is 87% atrial pacing but also 49% ventricular pacing do to very prolonged AV conduction time. Please refer to the separate pacemaker check note. There have been no episodes of atrial fibrillation or other types of mode switch since his last check.  Orders Placed This Encounter  Procedures  . EKG 12-Lead   Meds ordered this encounter  Medications  . nitroGLYCERIN (NITROSTAT) 0.4 MG SL tablet    Sig: Place 0.4 mg under the tongue every 5 (five) minutes x 3 doses as needed for chest pain.    Junious Silk, MD, [facility] [phone] office [phone] pager

## 2012-08-25 NOTE — Assessment & Plan Note (Signed)
St. Jude Accent DR RF 2210 dual-chamber permanent pacemaker implanted March 2010 for sinus node dysfunction. Full interrogation of device is performed today including active testing the pacing thresholds. There is 87% atrial pacing but also 49% ventricular pacing do to very prolonged AV conduction time. Please refer to the separate pacemaker check note. There have been no episodes of atrial fibrillation or other types of mode switch since his last check.

## 2012-09-25 ENCOUNTER — Ambulatory Visit
Admission: RE | Admit: 2012-09-25 | Discharge: 2012-09-25 | Disposition: A | Discharge status: Home / Self Care | Payer: 59 | Source: Ambulatory Visit | Attending: Internal Medicine | Admitting: Internal Medicine

## 2012-09-25 ENCOUNTER — Other Ambulatory Visit: Payer: Self-pay | Admitting: Internal Medicine

## 2012-09-25 DIAGNOSIS — R042 Hemoptysis: Secondary | ICD-10-CM

## 2012-11-28 LAB — PACEMAKER DEVICE OBSERVATION

## 2012-11-30 ENCOUNTER — Ambulatory Visit (INDEPENDENT_AMBULATORY_CARE_PROVIDER_SITE_OTHER): Payer: Medicare Other

## 2012-11-30 DIAGNOSIS — I255 Ischemic cardiomyopathy: Secondary | ICD-10-CM

## 2012-11-30 DIAGNOSIS — I498 Other specified cardiac arrhythmias: Secondary | ICD-10-CM

## 2012-11-30 DIAGNOSIS — R001 Bradycardia, unspecified: Secondary | ICD-10-CM

## 2012-11-30 DIAGNOSIS — I2589 Other forms of chronic ischemic heart disease: Secondary | ICD-10-CM

## 2012-12-09 ENCOUNTER — Encounter: Payer: Self-pay | Admitting: *Deleted

## 2012-12-09 LAB — REMOTE PACEMAKER DEVICE
AL AMPLITUDE: 4.9 mv
AL IMPEDENCE PM: 490 Ohm
ATRIAL PACING PM: 86
BAMS-0001: 150 {beats}/min
BATTERY VOLTAGE: 2.92 V
BRDY-0002RV: 70 {beats}/min
BRDY-0003RV: 115 {beats}/min
BRDY-0004RV: 120 {beats}/min
DEVICE MODEL PM: 2173363
RV LEAD AMPLITUDE: 11.8 mv
RV LEAD IMPEDENCE PM: 410 Ohm
RV LEAD THRESHOLD: 0.625 V
VENTRICULAR PACING PM: 58

## 2012-12-15 ENCOUNTER — Observation Stay (HOSPITAL_COMMUNITY)
Admission: EM | Admit: 2012-12-15 | Discharge: 2012-12-16 | Disposition: A | Discharge status: Home / Self Care | Payer: Medicare Other | Attending: Cardiovascular Disease | Admitting: Cardiovascular Disease

## 2012-12-15 ENCOUNTER — Encounter (HOSPITAL_COMMUNITY): Payer: Self-pay | Admitting: Emergency Medicine

## 2012-12-15 ENCOUNTER — Emergency Department (HOSPITAL_COMMUNITY): Payer: Medicare Other

## 2012-12-15 DIAGNOSIS — Z880 Allergy status to penicillin: Secondary | ICD-10-CM | POA: Insufficient documentation

## 2012-12-15 DIAGNOSIS — E7800 Pure hypercholesterolemia, unspecified: Secondary | ICD-10-CM | POA: Insufficient documentation

## 2012-12-15 DIAGNOSIS — Z9861 Coronary angioplasty status: Secondary | ICD-10-CM | POA: Insufficient documentation

## 2012-12-15 DIAGNOSIS — G4700 Insomnia, unspecified: Secondary | ICD-10-CM | POA: Insufficient documentation

## 2012-12-15 DIAGNOSIS — I2510 Atherosclerotic heart disease of native coronary artery without angina pectoris: Secondary | ICD-10-CM

## 2012-12-15 DIAGNOSIS — I4510 Unspecified right bundle-branch block: Secondary | ICD-10-CM | POA: Insufficient documentation

## 2012-12-15 DIAGNOSIS — R0789 Other chest pain: Secondary | ICD-10-CM

## 2012-12-15 DIAGNOSIS — R001 Bradycardia, unspecified: Secondary | ICD-10-CM

## 2012-12-15 DIAGNOSIS — Z950 Presence of cardiac pacemaker: Secondary | ICD-10-CM

## 2012-12-15 DIAGNOSIS — L03113 Cellulitis of right upper limb: Secondary | ICD-10-CM | POA: Diagnosis present

## 2012-12-15 DIAGNOSIS — I498 Other specified cardiac arrhythmias: Secondary | ICD-10-CM | POA: Insufficient documentation

## 2012-12-15 DIAGNOSIS — Z79899 Other long term (current) drug therapy: Secondary | ICD-10-CM | POA: Insufficient documentation

## 2012-12-15 DIAGNOSIS — Z7902 Long term (current) use of antithrombotics/antiplatelets: Secondary | ICD-10-CM | POA: Insufficient documentation

## 2012-12-15 DIAGNOSIS — I200 Unstable angina: Principal | ICD-10-CM

## 2012-12-15 DIAGNOSIS — Z87891 Personal history of nicotine dependence: Secondary | ICD-10-CM | POA: Insufficient documentation

## 2012-12-15 DIAGNOSIS — E039 Hypothyroidism, unspecified: Secondary | ICD-10-CM | POA: Insufficient documentation

## 2012-12-15 DIAGNOSIS — I255 Ischemic cardiomyopathy: Secondary | ICD-10-CM | POA: Diagnosis present

## 2012-12-15 DIAGNOSIS — K219 Gastro-esophageal reflux disease without esophagitis: Secondary | ICD-10-CM | POA: Insufficient documentation

## 2012-12-15 DIAGNOSIS — Z951 Presence of aortocoronary bypass graft: Secondary | ICD-10-CM | POA: Insufficient documentation

## 2012-12-15 DIAGNOSIS — I2589 Other forms of chronic ischemic heart disease: Secondary | ICD-10-CM

## 2012-12-15 HISTORY — DX: Malignant melanoma of other part of trunk: C43.59

## 2012-12-15 HISTORY — DX: Unspecified osteoarthritis, unspecified site: M19.90

## 2012-12-15 HISTORY — DX: Acute myocardial infarction, unspecified: I21.9

## 2012-12-15 HISTORY — DX: Shortness of breath: R06.02

## 2012-12-15 HISTORY — DX: Cardiac murmur, unspecified: R01.1

## 2012-12-15 LAB — CREATININE, SERUM
Creatinine, Ser: 0.75 mg/dL (ref 0.50–1.35)
GFR calc Af Amer: >90 mL/min (ref >90)
GFR calc non Af Amer: 83 mL/min — ABNORMAL LOW (ref >90)

## 2012-12-15 LAB — CBC WITH DIFFERENTIAL/PLATELET
Basophils Absolute: 0 10*3/uL (ref 0.0–0.1)
Basophils Relative: 1 % (ref 0–1)
Eosinophils Absolute: 0.2 10*3/uL (ref 0.0–0.7)
Eosinophils Relative: 3 % (ref 0–5)
HCT: 40.1 % (ref 39.0–52.0)
Hemoglobin: 13.5 g/dL (ref 13.0–17.0)
Lymphocytes Relative: 21 % (ref 12–46)
Lymphs Abs: 1.2 10*3/uL (ref 0.7–4.0)
MCH: 32.8 pg (ref 26.0–34.0)
MCHC: 33.7 g/dL (ref 30.0–36.0)
MCV: 97.3 fL (ref 78.0–100.0)
Monocytes Absolute: 0.5 10*3/uL (ref 0.1–1.0)
Monocytes Relative: 9 % (ref 3–12)
Neutro Abs: 3.9 10*3/uL (ref 1.7–7.7)
Neutrophils Relative %: 67 % (ref 43–77)
Platelets: 194 10*3/uL (ref 150–400)
RBC: 4.12 MIL/uL — ABNORMAL LOW (ref 4.22–5.81)
RDW: 14.1 % (ref 11.5–15.5)
WBC: 5.8 10*3/uL (ref 4.0–10.5)

## 2012-12-15 LAB — TROPONIN I: Troponin I: <0.3 ng/mL (ref <0.30)

## 2012-12-15 LAB — CBC
HCT: 43.2 % (ref 39.0–52.0)
Hemoglobin: 14.8 g/dL (ref 13.0–17.0)
MCH: 33.6 pg (ref 26.0–34.0)
MCHC: 34.3 g/dL (ref 30.0–36.0)
MCV: 98 fL (ref 78.0–100.0)
Platelets: 197 10*3/uL (ref 150–400)
RBC: 4.41 MIL/uL (ref 4.22–5.81)
RDW: 14.3 % (ref 11.5–15.5)
WBC: 7.2 10*3/uL (ref 4.0–10.5)

## 2012-12-15 LAB — POCT I-STAT, CHEM 8
BUN: 18 mg/dL (ref 6–23)
Calcium, Ion: 1.18 mmol/L (ref 1.13–1.30)
Chloride: 103 mEq/L (ref 96–112)
Creatinine, Ser: 1 mg/dL (ref 0.50–1.35)
Glucose, Bld: 104 mg/dL — ABNORMAL HIGH (ref 70–99)
HCT: 42 % (ref 39.0–52.0)
Hemoglobin: 14.3 g/dL (ref 13.0–17.0)
Potassium: 3.9 mEq/L (ref 3.5–5.1)
Sodium: 142 mEq/L (ref 135–145)
TCO2: 25 mmol/L (ref 0–100)

## 2012-12-15 LAB — HEMOGLOBIN A1C
Hgb A1c MFr Bld: 5.6 % (ref <5.7)
Mean Plasma Glucose: 114 mg/dL (ref <117)

## 2012-12-15 LAB — POCT I-STAT TROPONIN I: Troponin i, poc: 0.01 ng/mL (ref 0.00–0.08)

## 2012-12-15 MED ORDER — CLOPIDOGREL BISULFATE 75 MG PO TABS
75.0000 mg | ORAL_TABLET | Freq: Once | ORAL | Status: AC
  Administered 2012-12-15: 75 mg via ORAL
  Filled 2012-12-15: qty 1

## 2012-12-15 MED ORDER — TORSEMIDE 20 MG PO TABS
20.0000 mg | ORAL_TABLET | Freq: Every day | ORAL | Status: DC | PRN
  Filled 2012-12-15: qty 2

## 2012-12-15 MED ORDER — SIMVASTATIN 40 MG PO TABS
40.0000 mg | ORAL_TABLET | Freq: Every day | ORAL | Status: DC
  Administered 2012-12-15 – 2012-12-16 (×2): 40 mg via ORAL
  Filled 2012-12-15 (×2): qty 1

## 2012-12-15 MED ORDER — PANTOPRAZOLE SODIUM 40 MG PO TBEC
40.0000 mg | DELAYED_RELEASE_TABLET | Freq: Every day | ORAL | Status: DC
  Administered 2012-12-15 – 2012-12-16 (×2): 40 mg via ORAL
  Filled 2012-12-15 (×2): qty 1

## 2012-12-15 MED ORDER — NITROGLYCERIN 0.4 MG SL SUBL: 0.4000 mg | SUBLINGUAL_TABLET | SUBLINGUAL | Status: DC | PRN

## 2012-12-15 MED ORDER — ONDANSETRON HCL 4 MG/2ML IJ SOLN: 4.0000 mg | Freq: Four times a day (QID) | INTRAMUSCULAR | Status: DC | PRN

## 2012-12-15 MED ORDER — ZOLPIDEM TARTRATE 5 MG PO TABS
5.0000 mg | ORAL_TABLET | Freq: Every evening | ORAL | Status: DC | PRN
  Administered 2012-12-15: 5 mg via ORAL
  Filled 2012-12-15: qty 1

## 2012-12-15 MED ORDER — CLOPIDOGREL BISULFATE 75 MG PO TABS
75.0000 mg | ORAL_TABLET | Freq: Every day | ORAL | Status: DC
  Administered 2012-12-16: 75 mg via ORAL
  Filled 2012-12-15: qty 1

## 2012-12-15 MED ORDER — LEVOTHYROXINE SODIUM 100 MCG PO TABS
100.0000 ug | ORAL_TABLET | Freq: Every day | ORAL | Status: DC
  Administered 2012-12-16: 100 ug via ORAL
  Filled 2012-12-15 (×3): qty 1

## 2012-12-15 MED ORDER — CLOPIDOGREL BISULFATE 75 MG PO TABS: 75.0000 mg | ORAL_TABLET | Freq: Once | ORAL | Status: DC

## 2012-12-15 MED ORDER — ACETAMINOPHEN 325 MG PO TABS: 650.0000 mg | ORAL_TABLET | ORAL | Status: DC | PRN

## 2012-12-15 MED ORDER — ASPIRIN EC 81 MG PO TBEC
81.0000 mg | DELAYED_RELEASE_TABLET | Freq: Every day | ORAL | Status: DC
  Administered 2012-12-16: 81 mg via ORAL
  Filled 2012-12-15: qty 1

## 2012-12-15 MED ORDER — HEPARIN SODIUM (PORCINE) 5000 UNIT/ML IJ SOLN
5000.0000 [IU] | Freq: Three times a day (TID) | INTRAMUSCULAR | Status: DC
  Administered 2012-12-15 – 2012-12-16 (×3): 5000 [IU] via SUBCUTANEOUS
  Filled 2012-12-15 (×7): qty 1

## 2012-12-15 MED ORDER — METOPROLOL TARTRATE 12.5 MG HALF TABLET
12.5000 mg | ORAL_TABLET | Freq: Two times a day (BID) | ORAL | Status: DC
  Administered 2012-12-15 – 2012-12-16 (×2): 12.5 mg via ORAL
  Filled 2012-12-15 (×3): qty 1

## 2012-12-15 NOTE — ED Notes (Signed)
Pt being transported to Radiology Department 

## 2012-12-15 NOTE — ED Provider Notes (Signed)
CSN: [id number]     Arrival date & time 12/15/12  0906 History   First MD Initiated Contact with Patient 12/15/12 [phone]     Chief Complaint  Patient presents with  . Chest Pain   (Consider location/radiation/quality/duration/timing/severity/associated sxs/prior Treatment) HPI [place] of intermittent left anterior chest pain onset 4 days ago. Pain lasts approximately one or 2 hours at a time also complains of pain in left arm and hand accompanying chest pain. Pain became worse this morning. He was treated by EMS with 4 baby aspirin this. He is presently asymptomatic.Marland Kitchen No associated nausea sweatiness or shortness of breath. Pain not made better or worse by anything. Past Medical History  Diagnosis Date  . CAD (coronary artery disease)     CABG (LIMA-LAD, SVG-RCA, SVG-OM in 1996).  07/2009 BMS to SVG-RCA. Cath in 04/2010 with patent stents   . Bradycardia     AFib/SSS s/p St Jude PPM 04/12/2008  . Hypothyroid   . Pacemaker   . Hyperlipidemia   . GERD (gastroesophageal reflux disease)   . Hiatal hernia   . Insomnia   . S/P CABG x 4   . RBBB    Past Surgical History  Procedure Laterality Date  . Coronary artery bypass graft  1996    LIMA to LAD,SVG to RCA & SVG to OM  . Coronary angioplasty with stent placement  07/2009    bare metal stent to SVG to the RCA  . Cardiac catheterization  04/2010    LIMA to LAD patent,SVG to OM patent,no in-stnet restenosis RCA  . US echocardiography  07/11/2009    EF 45-50%  . Nm myoview ltd  06/2011    low risk   No family history on file. History  Substance Use Topics  . Smoking status: Former Smoker -- 7 years  . Smokeless tobacco: Never Used     Comment: Hasn't had a cigarette since he was 79 years old and even then it was occasional  . Alcohol Use: No    Review of Systems  Constitutional: Negative.   HENT: Negative.   Respiratory: Negative.   Cardiovascular: Positive for chest pain.  Gastrointestinal: Negative.   Musculoskeletal: Negative.    Skin: Negative.   Neurological: Negative.   Psychiatric/Behavioral: Negative.   All other systems reviewed and are negative.    Allergies  Altace; Penicillins; and Sulfa antibiotics  Home Medications   Current Outpatient Rx  Name  Route  Sig  Dispense  Refill  . clobetasol cream (TEMOVATE) 0.05 %   Topical   Apply 1 application topically daily as needed (for irritation).          . clopidogrel (PLAVIX) 75 MG tablet   Oral   Take 75 mg by mouth daily.         . diazepam (VALIUM) 5 MG tablet   Oral   Take 5 mg by mouth daily as needed. For anxiety         . levothyroxine (SYNTHROID, LEVOTHROID) 100 MCG tablet   Oral   Take 100 mcg by mouth daily.         . metoprolol tartrate (LOPRESSOR) 25 MG tablet   Oral   Take 12.5 mg by mouth 2 (two) times daily.          . nitroGLYCERIN (NITROSTAT) 0.4 MG SL tablet   Sublingual   Place 0.4 mg under the tongue every 5 (five) minutes x 3 doses as needed for chest pain.         Marland Kitchen  Nutritional Supplements (VITAMIN D MAINTENANCE PO)   Oral   Take 5,000 Units by mouth daily.         . pantoprazole (PROTONIX) 40 MG tablet   Oral   Take 40 mg by mouth daily.         . pravastatin (PRAVACHOL) 80 MG tablet   Oral   Take 40 mg by mouth daily.         Marland Kitchen torsemide (DEMADEX) 20 MG tablet   Oral   Take 20-40 mg by mouth daily as needed (for swelling).         . zolpidem (AMBIEN) 10 MG tablet   Oral   Take 10 mg by mouth at bedtime as needed for sleep.          BP 128/70  Pulse 70  Temp(Src) 97.9 F (36.6 C) (Oral)  Resp 18  SpO2 96% Physical Exam  Nursing note and vitals reviewed. Constitutional: He appears well-developed and well-nourished.  HENT:  Head: Normocephalic and atraumatic.  Eyes: Conjunctivae are normal. Pupils are equal, round, and reactive to light.  Neck: Neck supple. No tracheal deviation present. No thyromegaly present.  Cardiovascular: Normal rate and regular rhythm.   No murmur  heard. Pulmonary/Chest: Effort normal and breath sounds normal.  Abdominal: Soft. Bowel sounds are normal. He exhibits no distension. There is no tenderness.  Musculoskeletal: Normal range of motion. He exhibits no edema and no tenderness.  Neurological: He is alert. Coordination normal.  Skin: Skin is warm and dry. No rash noted.  Psychiatric: He has a normal mood and affect.    ED Course  Procedures (including critical care time) Labs Review Labs Reviewed  COMPREHENSIVE METABOLIC PANEL  CBC WITH DIFFERENTIAL   Imaging Review No results found.  EKG Interpretation   None      Date: 12/15/2012  Rate: 70  Rhythm: Electronically paced  QRS Axis: left  Intervals: normal  ST/T Wave abnormalities: nonspecific T wave changes  Conduction Disutrbances:nonspecific intraventricular conduction delay  Narrative Interpretation:   Old EKG Reviewed: Tracing from 04/15/2012 showed right bundle branch block pattern, normal sinus rhythm Results for orders placed during the hospital encounter of 12/15/12  CBC WITH DIFFERENTIAL      Result Value Range   WBC 5.8  4.0 - 10.5 K/uL   RBC 4.12 (*) 4.22 - 5.81 MIL/uL   Hemoglobin 13.5  13.0 - 17.0 g/dL   HCT 16.1  09.6 - 04.5 %   MCV 97.3  78.0 - 100.0 fL   MCH 32.8  26.0 - 34.0 pg   MCHC 33.7  30.0 - 36.0 g/dL   RDW 40.9  81.1 - 91.4 %   Platelets 194  150 - 400 K/uL   Neutrophils Relative % 67  43 - 77 %   Neutro Abs 3.9  1.7 - 7.7 K/uL   Lymphocytes Relative 21  12 - 46 %   Lymphs Abs 1.2  0.7 - 4.0 K/uL   Monocytes Relative 9  3 - 12 %   Monocytes Absolute 0.5  0.1 - 1.0 K/uL   Eosinophils Relative 3  0 - 5 %   Eosinophils Absolute 0.2  0.0 - 0.7 K/uL   Basophils Relative 1  0 - 1 %   Basophils Absolute 0.0  0.0 - 0.1 K/uL  POCT I-STAT, CHEM 8      Result Value Range   Sodium 142  135 - 145 mEq/L   Potassium 3.9  3.5 - 5.1 mEq/L  Chloride 103  96 - 112 mEq/L   BUN 18  6 - 23 mg/dL   Creatinine, Ser 1.61  0.50 - 1.35 mg/dL    Glucose, Bld 096 (*) 70 - 99 mg/dL   Calcium, Ion 0.45  4.09 - 1.30 mmol/L   TCO2 25  0 - 100 mmol/L   Hemoglobin 14.3  13.0 - 17.0 g/dL   HCT 81.1  91.4 - 78.2 %  POCT I-STAT TROPONIN I      Result Value Range   Troponin i, poc 0.01  0.00 - 0.08 ng/mL   Comment 3            Dg Chest 2 View  12/15/2012   CLINICAL DATA:  Pain.  EXAM: CHEST  2 VIEW  COMPARISON:  CT chest 09/25/2012.  FINDINGS: Mediastinum and hilar structures are normal. Prior CABG. Cardiac pacer noted with lead tips over the right atrium and right ventricle. Cardiomegaly. No CHF. Mild atelectasis versus infiltrate left lung base. Prominent sliding hiatal hernia. No pleural effusion or pneumothorax. Degenerative changes both shoulders and thoracic spine. Diffuse thoracic cage osteopenia.  IMPRESSION: 1. Prominent hiatal hernia with mild atelectasis versus infiltrate left lung base. 2. Cardiomegaly, no CHF. Prior CABG. Cardiac pacer.   Electronically Signed   By: Maisie Fus  Register   On: 12/15/2012 10:05    Chest xray viewed by me Spoke with cardiology service who will evauate pt in the ed MDM  No diagnosis found. Cardiology service came to evaluate patient and arrange for admission to telemetry Diagnosis unstable angina     Doug Sou, MD 12/15/12 1609

## 2012-12-15 NOTE — ED Notes (Signed)
Heart healthy diet ordered. 

## 2012-12-15 NOTE — ED Notes (Signed)
Pt still await cards dr to see pt pt given water

## 2012-12-15 NOTE — H&P (Signed)
Chief Complaint: Left Arm Pain, numbness and tingling.  HPI: The patient is a 79 y/o male, followed by Dr. Royann Shivers, who presents for evaluation of chest pain. He has an extensive history of coronary disease. He reports that he apparently suffered a 2 silent MIs in 1995/1996 and underwent bypass surgery in 1996. LIMA-LAD, SVG-RCA,SVG-OM. He is graft dependent and had patent bypasses to all 3 major coronary territories by cardiac catheterization in 2012. This included a widely patent stent in the saphenous vein graft bypass to the right coronary artery placed in 2011.  He has mild to moderate left ventricular systolic dysfunction with an EF of 41% by nuclear scintigraphy and 45% by echocardiography. There is evidence of grade 1 diastolic dysfunction. He requires intermittent diuretic therapy. Severe symptomatic orthostatic hypotension has led to discontinuation of beta blockers and ACE inhibitors.  He also has a Artist DR RF [id number] dual-chamber permanent pacemaker, implanted March 2010 for sinus node dysfunction. His last office visit with Dr. Royann Shivers was 08/19/12, and at that time, he was stable from a cardiovascular standpoint.   He presented to [facility] today for evaluation of left arm pain, numbness and tingling that started earlier this morning. He denies any chest pain. He has noticed more DOE over the last several weeks and has had to use his PRN diuretic more often. He denies orthopnea and PND. He notes mild LEE. He denies any speech or visual changes with his arm numbness. He felt a bit dizzy, but no syncope/near syncope. No extremity weakness. No past history of stroke or TIA.   He called EMS and was transported to the ED for evaluation. A 12 lead EKG demonstrated LBBB, not noted on prior EKGs. POC troponin unremarkable. Neuro exam normal. Vitals stable. He is completely asymptomatic currently.    Past Medical History  Diagnosis Date  . CAD (coronary artery disease)     CABG (LIMA-LAD,  SVG-RCA, SVG-OM in 1996).  07/2009 BMS to SVG-RCA. Cath in 04/2010 with patent stents   . Bradycardia     AFib/SSS s/p St Jude PPM 04/12/2008  . Hypothyroid   . Pacemaker   . Hyperlipidemia   . GERD (gastroesophageal reflux disease)   . Hiatal hernia   . Insomnia   . S/P CABG x 4   . RBBB     Past Surgical History  Procedure Laterality Date  . Coronary artery bypass graft  1996    LIMA to LAD,SVG to RCA & SVG to OM  . Coronary angioplasty with stent placement  07/2009    bare metal stent to SVG to the RCA  . Cardiac catheterization  04/2010    LIMA to LAD patent,SVG to OM patent,no in-stnet restenosis RCA  . US echocardiography  07/11/2009    EF 45-50%  . Nm myoview ltd  06/2011    low risk    Family History  Problem Relation Age of Onset  . Coronary artery disease Mother   . Coronary artery disease Father    Social History:  reports that he has quit smoking. He has never used smokeless tobacco. He reports that he does not drink alcohol or use illicit drugs.  Allergies:  Allergies  Allergen Reactions  . Altace [Ramipril]     Cough  . Penicillins Rash  . Sulfa Antibiotics Rash     (Not in a hospital admission)  Results for orders placed during the hospital encounter of 12/15/12 (from the past 48 hour(s))  CBC WITH DIFFERENTIAL  Status: Abnormal   Collection Time    12/15/12  9:34 AM      Result Value Range   WBC 5.8  4.0 - 10.5 K/uL   RBC 4.12 (*) 4.22 - 5.81 MIL/uL   Hemoglobin 13.5  13.0 - 17.0 g/dL   HCT 16.1  09.6 - 04.5 %   MCV 97.3  78.0 - 100.0 fL   MCH 32.8  26.0 - 34.0 pg   MCHC 33.7  30.0 - 36.0 g/dL   RDW 40.9  81.1 - 91.4 %   Platelets 194  150 - 400 K/uL   Neutrophils Relative % 67  43 - 77 %   Neutro Abs 3.9  1.7 - 7.7 K/uL   Lymphocytes Relative 21  12 - 46 %   Lymphs Abs 1.2  0.7 - 4.0 K/uL   Monocytes Relative 9  3 - 12 %   Monocytes Absolute 0.5  0.1 - 1.0 K/uL   Eosinophils Relative 3  0 - 5 %   Eosinophils Absolute 0.2  0.0 - 0.7 K/uL    Basophils Relative 1  0 - 1 %   Basophils Absolute 0.0  0.0 - 0.1 K/uL  POCT I-STAT TROPONIN I     Status: None   Collection Time    12/15/12  9:50 AM      Result Value Range   Troponin i, poc 0.01  0.00 - 0.08 ng/mL   Comment 3            Comment: Due to the release kinetics of cTnI,     a negative result within the first hours     of the onset of symptoms does not rule out     myocardial infarction with certainty.     If myocardial infarction is still suspected,     repeat the test at appropriate intervals.  POCT I-STAT, CHEM 8     Status: Abnormal   Collection Time    12/15/12  9:52 AM      Result Value Range   Sodium 142  135 - 145 mEq/L   Potassium 3.9  3.5 - 5.1 mEq/L   Chloride 103  96 - 112 mEq/L   BUN 18  6 - 23 mg/dL   Creatinine, Ser 7.82  0.50 - 1.35 mg/dL   Glucose, Bld 956 (*) 70 - 99 mg/dL   Calcium, Ion 2.13  0.86 - 1.30 mmol/L   TCO2 25  0 - 100 mmol/L   Hemoglobin 14.3  13.0 - 17.0 g/dL   HCT 57.8  46.9 - 62.9 %   Dg Chest 2 View  12/15/2012   CLINICAL DATA:  Pain.  EXAM: CHEST  2 VIEW  COMPARISON:  CT chest 09/25/2012.  FINDINGS: Mediastinum and hilar structures are normal. Prior CABG. Cardiac pacer noted with lead tips over the right atrium and right ventricle. Cardiomegaly. No CHF. Mild atelectasis versus infiltrate left lung base. Prominent sliding hiatal hernia. No pleural effusion or pneumothorax. Degenerative changes both shoulders and thoracic spine. Diffuse thoracic cage osteopenia.  IMPRESSION: 1. Prominent hiatal hernia with mild atelectasis versus infiltrate left lung base. 2. Cardiomegaly, no CHF. Prior CABG. Cardiac pacer.   Electronically Signed   By: Maisie Fus  Register   On: 12/15/2012 10:05    Review of Systems  Constitutional: Negative for diaphoresis.  Respiratory: Positive for shortness of breath.   Cardiovascular: Positive for leg swelling. Negative for chest pain, orthopnea and PND.  Neurological: Positive for dizziness and tingling.  Negative  for sensory change, speech change, focal weakness, loss of consciousness and weakness.  All other systems reviewed and are negative.    Blood pressure 104/64, pulse 71, temperature 97.9 F (36.6 C), temperature source Oral, resp. rate 18, SpO2 95.00%. Physical Exam  Constitutional: He is oriented to person, place, and time. He appears well-developed and well-nourished. No distress.  Cardiovascular: Normal rate, regular rhythm and intact distal pulses.   Murmur (2/6 best heard at LUSB) heard. Respiratory: Effort normal and breath sounds normal. No respiratory distress. He has no wheezes. He exhibits no tenderness.  GI: Soft. Bowel sounds are normal. He exhibits no distension and no mass. There is no tenderness.  Musculoskeletal: He exhibits no edema.  Neurological: He is alert and oriented to person, place, and time. He has normal strength. No sensory deficit.  Skin: Skin is warm and dry. He is not diaphoretic.  Psychiatric: He has a normal mood and affect. His behavior is normal.     Assessment/Plan Active Problems:   CAD (coronary artery disease)   Cardiomyopathy, ischemic   Pacemaker  Plan:  79 y/o male with know CAD, s/p CABG in 1996 and PCI + stent to SVG-RCA in 2011, patent grafts and SVG-RCA stent on cath in 2012, presents for evaluation of left upper extremity pain/ numbness. EKG demonstrates new LBBB. He denies CP. Vitals stable. Symptoms have resolved. Will have MD to evaluate. ? Admit, since patient has past history of silent MI to cycle troponins x 3. Pacemaker interrogation. ? If left arm/finger pain is carpal tunnel syndrome, as pain and numbness is in the distribution of the radial nerve and symptoms were reproduced Phalen's test. MD to follow.   Allayne Butcher, PA-C 12/15/2012, 11:16 AM   Agree with note written by Boyce Medici  PAC  Pt of Dr. Salena Saner with h/o remote CABG, stenting of an SVG, PTVPM. Developed numbness and tingling of both hands L>R this  AM. Currently pain free. No CP. Exam benign. Labs OK. EKG paced. Doubt cardiac. Will admit, cycle enzymes, and if neg can be D/Cd home in AM with OP myoview.   Runell Gess 12/15/2012 2:05 PM

## 2012-12-15 NOTE — ED Notes (Signed)
Left arm pain and cp that started when he was eating breakfast this am pt pain free now also had some intermittent sob was given 324 asa has 20 left forearm per ems

## 2012-12-15 NOTE — ED Notes (Signed)
Wife out to ask for food for pt called Adam [name] to have dr call me back for answer

## 2012-12-15 NOTE — ED Notes (Signed)
Pt has returned from being out of the department; pt placed back on monitor, continuous pulse oximetry and blood pressure cuff; family at bedside 

## 2012-12-16 ENCOUNTER — Other Ambulatory Visit: Payer: Self-pay | Admitting: Cardiology

## 2012-12-16 DIAGNOSIS — I208 Other forms of angina pectoris: Secondary | ICD-10-CM

## 2012-12-16 DIAGNOSIS — L03113 Cellulitis of right upper limb: Secondary | ICD-10-CM | POA: Diagnosis present

## 2012-12-16 LAB — BASIC METABOLIC PANEL
BUN: 15 mg/dL (ref 6–23)
CO2: 26 mEq/L (ref 19–32)
Calcium: 8.9 mg/dL (ref 8.4–10.5)
Chloride: 104 mEq/L (ref 96–112)
Creatinine, Ser: 0.78 mg/dL (ref 0.50–1.35)
GFR calc Af Amer: >90 mL/min (ref >90)
GFR calc non Af Amer: 81 mL/min — ABNORMAL LOW (ref >90)
Glucose, Bld: 95 mg/dL (ref 70–99)
Potassium: 4.2 mEq/L (ref 3.5–5.1)
Sodium: 140 mEq/L (ref 135–145)

## 2012-12-16 LAB — CBC
HCT: 43.1 % (ref 39.0–52.0)
Hemoglobin: 14.2 g/dL (ref 13.0–17.0)
MCH: 31.9 pg (ref 26.0–34.0)
MCHC: 32.9 g/dL (ref 30.0–36.0)
MCV: 96.9 fL (ref 78.0–100.0)
Platelets: 203 10*3/uL (ref 150–400)
RBC: 4.45 MIL/uL (ref 4.22–5.81)
RDW: 13.9 % (ref 11.5–15.5)
WBC: 7.4 10*3/uL (ref 4.0–10.5)

## 2012-12-16 LAB — TROPONIN I
Troponin I: <0.3 ng/mL (ref <0.30)
Troponin I: <0.3 ng/mL (ref <0.30)

## 2012-12-16 MED ORDER — ASPIRIN 81 MG PO TBEC: 81.0000 mg | DELAYED_RELEASE_TABLET | Freq: Every day | ORAL | Status: DC

## 2012-12-16 NOTE — Progress Notes (Signed)
UR COMPLETED  

## 2012-12-16 NOTE — Discharge Summary (Signed)
Physician Discharge Summary       Patient ID: Adam [name] MRN: [id number] DOB/AGE: 77-Dec-1928 77 y.o.  Admit date: 12/15/2012 Discharge date: 12/16/2012  Discharge Diagnoses:  Principal Problem:   Arm pain, possible anginal equivialnt  Active Problems:   CAD (coronary artery disease)   Cardiomyopathy, ischemic   Pacemaker   Discharged Condition: good  Procedures: none  Hospital Course:  79 y/o male, followed by Dr. Royann Shivers, who presented 12/15/12 for evaluation of chest pain. He has an extensive history of coronary disease. He reports that he apparently suffered a 2 silent MIs in 1995/1996 and underwent bypass surgery in 1996. LIMA-LAD, SVG-RCA,SVG-OM. He is graft dependent and had patent bypasses to all 3 major coronary territories by cardiac catheterization in 2012. This included a widely patent stent in the saphenous vein graft bypass to the right coronary artery placed in 2011. He has mild to moderate left ventricular systolic dysfunction with an EF of 41% by nuclear scintigraphy and 45% by echocardiography. There is evidence of grade 1 diastolic dysfunction. He requires intermittent diuretic therapy. Severe symptomatic orthostatic hypotension has led to discontinuation of beta blockers and ACE inhibitors. He also has a Artist DR RF 2210 dual-chamber permanent pacemaker, implanted March 2010 for sinus node dysfunction. His last office visit with Dr. Royann Shivers was 08/19/12, and at that time, he was stable from a cardiovascular standpoint.   He presented to [facility] 12/15/12 for evaluation of left arm pain, numbness and tingling that started earlier this morning. He denies any chest pain. He has noticed more DOE over the last several weeks and has had to use his PRN diuretic more often. He denies orthopnea and PND. He notes mild LEE. He denies any speech or visual changes with his arm numbness. He felt a bit dizzy, but no syncope/near syncope. No extremity weakness. No past history  of stroke or TIA.   He called EMS and was transported to the ED for evaluation. A 12 lead EKG demonstrated LBBB, not noted on prior EKGs. Though he was AV pacing.   POC troponin unremarkable. Neuro exam normal. Vitals stable.He was admitted to obs. And cardiac enzymes remained neg.  The next day he had no further discomfort and was ready for discharge.  We will do outpt  Lexiscan and then follow up with Dr. Royann Shivers.  He was seen found stable and discharged by DR. Allyson Sabal. When he is not pacing he is in RBBB.    Consults: None  Significant Diagnostic Studies:  BMET    Component Value Date/Time   NA 140 12/16/2012 0735   K 4.2 12/16/2012 0735   CL 104 12/16/2012 0735   CO2 26 12/16/2012 0735   GLUCOSE 95 12/16/2012 0735   BUN 15 12/16/2012 0735   CREATININE 0.78 12/16/2012 0735   CALCIUM 8.9 12/16/2012 0735   GFRNONAA 81* 12/16/2012 0735   GFRAA >90 12/16/2012 0735    CBC    Component Value Date/Time   WBC 7.4 12/16/2012 0735   RBC 4.45 12/16/2012 0735   HGB 14.2 12/16/2012 0735   HCT 43.1 12/16/2012 0735   PLT 203 12/16/2012 0735   MCV 96.9 12/16/2012 0735   MCH 31.9 12/16/2012 0735   MCHC 32.9 12/16/2012 0735   RDW 13.9 12/16/2012 0735   LYMPHSABS 1.2 12/15/2012 0934   MONOABS 0.5 12/15/2012 0934   EOSABS 0.2 12/15/2012 0934   BASOSABS 0.0 12/15/2012 0934   Troponin <0.30 X 3   2V CXR:  IMPRESSION: 1. Prominent hiatal  hernia with mild atelectasis versus infiltrate left lung base. 2. Cardiomegaly, no CHF. Prior CABG. Cardiac pacer.    Discharge Exam: Blood pressure 122/70, pulse 70, temperature 97.6 F (36.4 C), temperature source Oral, resp. rate 18, height 5\' 10"  (1.778 m), SpO2 97.00%.   AM exam:  PE:  General appearance: alert, cooperative and no distress  Lungs: clear to auscultation bilaterally  Heart: regular rate and rhythm  Extremities: no LEE  Pulses: 2+ and symmetric  Skin: warm and dry  Neurologic: Grossly normal     Disposition: 01-Home or  Self Care       Future Appointments Provider Department Dept Phone   03/01/2013 9:35 AM [facility] Office [phone]       Medication List         aspirin 81 MG EC tablet  Take 1 tablet (81 mg total) by mouth daily.     clobetasol cream 0.05 %  Commonly known as:  TEMOVATE  Apply 1 application topically daily as needed (for irritation).     clopidogrel 75 MG tablet  Commonly known as:  PLAVIX  Take 75 mg by mouth daily.     diazepam 5 MG tablet  Commonly known as:  VALIUM  Take 5 mg by mouth daily as needed. For anxiety     levothyroxine 100 MCG tablet  Commonly known as:  SYNTHROID, LEVOTHROID  Take 100 mcg by mouth daily.     metoprolol tartrate 25 MG tablet  Commonly known as:  LOPRESSOR  Take 12.5 mg by mouth 2 (two) times daily.     nitroGLYCERIN 0.4 MG SL tablet  Commonly known as:  NITROSTAT  Place 0.4 mg under the tongue every 5 (five) minutes x 3 doses as needed for chest pain.     pantoprazole 40 MG tablet  Commonly known as:  PROTONIX  Take 40 mg by mouth daily.     pravastatin 80 MG tablet  Commonly known as:  PRAVACHOL  Take 40 mg by mouth daily.     torsemide 20 MG tablet  Commonly known as:  DEMADEX  Take 20-40 mg by mouth daily as needed (for swelling).     VITAMIN D MAINTENANCE PO  Take 5,000 Units by mouth daily.     zolpidem 10 MG tablet  Commonly known as:  AMBIEN  Take 10 mg by mouth at bedtime as needed for sleep.       Follow-up Information   Follow up with Thurmon Fair, MD. (our office will call and arrange appt.)    Specialty:  Cardiology   Contact information:   [address] [place] [address] [phone]        Discharge Instructions: We will schedule you an outpatient stress test.  The office will call you tomorrow.  Then a follow up appt.  Call if further problems.  Heart healthy diet.  Signed: Leone Brand Nurse Practitioner-Certified [facility]: [facility] 12/16/2012, 8:01 PM  Time spent on discharge :25 minutes.

## 2012-12-16 NOTE — Progress Notes (Signed)
Subjective: Feeling better. Still feels occasional bilateral upper extremity tingling.   Objective: Vital signs in last 24 hours: Temp:  [97.6 F (36.4 C)-97.8 F (36.6 C)] 97.6 F (36.4 C) (11/12 0543) Pulse Rate:  [68-71] 70 (11/12 1049) Resp:  [14-22] 20 (11/12 0543) BP: (115-140)/(54-88) 129/70 mmHg (11/12 1049) SpO2:  [94 %-98 %] 94 % (11/12 0543) Last BM Date: 12/14/12  Intake/Output from previous day:   Intake/Output this shift: Total I/O In: 360 [P.O.:360] Out: -   Medications Current Facility-Administered Medications  Medication Dose Route Frequency Provider Last Rate Last Dose  . acetaminophen (TYLENOL) tablet 650 mg  650 mg Oral Q4H PRN Brittainy Simmons, PA-C      . aspirin EC tablet 81 mg  81 mg Oral Daily Brittainy Simmons, PA-C   81 mg at 12/16/12 1050  . clopidogrel (PLAVIX) tablet 75 mg  75 mg Oral Daily Brittainy Simmons, PA-C   75 mg at 12/16/12 1050  . heparin injection 5,000 Units  5,000 Units Subcutaneous Q8H Brittainy Simmons, PA-C   5,000 Units at 12/16/12 1610  . levothyroxine (SYNTHROID, LEVOTHROID) tablet 100 mcg  100 mcg Oral QAC breakfast Brittainy Simmons, PA-C   100 mcg at 12/16/12 9604  . metoprolol tartrate (LOPRESSOR) tablet 12.5 mg  12.5 mg Oral BID Brittainy Simmons, PA-C   12.5 mg at 12/16/12 1050  . nitroGLYCERIN (NITROSTAT) SL tablet 0.4 mg  0.4 mg Sublingual Q5 Min x 3 PRN Brittainy Simmons, PA-C      . ondansetron (ZOFRAN) injection 4 mg  4 mg Intravenous Q6H PRN Brittainy Simmons, PA-C      . pantoprazole (PROTONIX) EC tablet 40 mg  40 mg Oral Daily Brittainy Simmons, PA-C   40 mg at 12/16/12 1050  . simvastatin (ZOCOR) tablet 40 mg  40 mg Oral q1800 Brittainy Simmons, PA-C   40 mg at 12/15/12 1836  . torsemide (DEMADEX) tablet 20-40 mg  20-40 mg Oral Daily PRN Brittainy Simmons, PA-C      . zolpidem (AMBIEN) tablet 5 mg  5 mg Oral QHS PRN Robbie Lis, PA-C   5 mg at 12/15/12 2229    PE: General appearance: alert,  cooperative and no distress Lungs: clear to auscultation bilaterally Heart: regular rate and rhythm Extremities: no LEE Pulses: 2+ and symmetric Skin: warm and dry Neurologic: Grossly normal  Lab Results:   Recent Labs  12/15/12 0934 12/15/12 0952 12/15/12 1830 12/16/12 0735  WBC 5.8  --  7.2 7.4  HGB 13.5 14.3 14.8 14.2  HCT 40.1 42.0 43.2 43.1  PLT 194  --  197 203   BMET  Recent Labs  12/15/12 0952 12/15/12 1830 12/16/12 0735  NA 142  --  140  K 3.9  --  4.2  CL 103  --  104  CO2  --   --  26  GLUCOSE 104*  --  95  BUN 18  --  15  CREATININE 1.00 0.75 0.78  CALCIUM  --   --  8.9   Cardiac Panel (last 3 results)  Recent Labs  12/15/12 1830 12/15/12 2305 12/16/12 0735  TROPONINI <0.30 <0.30 <0.30     Assessment/Plan    Active Problems:   CAD (coronary artery disease)   Cardiomyopathy, ischemic   Pacemaker  Plan: He has ruled out for MI. Plan for NST as an OP. Can d/c home today. MD to follow.     LOS: 1 day    Brittainy M. Delmer Islam 12/16/2012 12:55 PM  Agree with note written by Boyce Medici  PAC  Enz neg. Labs OK. Exam benign. Atrially paced rhythm. OK for D/C home. Lexiscan myoview then ROV with Dr. Julieanne Cotton J 12/16/2012 5:36 PM

## 2012-12-16 NOTE — Progress Notes (Signed)
Chaplain offered emotional and spiritual support to pt and pt's wife, empathic listening, caring presence, prayer, and grief support.   Patient and wife have worked in full-time [employer] for over sixty years. Patient is somewhat "frustrated and worried" over the uncertainty and "unknown" of his health and what is causing his shortness of breath. Both the pt and his wife spoke at length about the loss of their son many years ago from a brain aneurysm - she said, "you never get over that." They have a very close and supportive family and community. Pt and his wife requested prayer, and said they were very grateful for the "blessing" of chaplain support.   Maurene Capes, [place] [phone]

## 2012-12-22 ENCOUNTER — Ambulatory Visit (HOSPITAL_COMMUNITY)
Admission: RE | Admit: 2012-12-22 | Discharge: 2012-12-22 | Disposition: A | Discharge status: Home / Self Care | Payer: Medicare Other | Source: Ambulatory Visit | Attending: Internal Medicine | Admitting: Internal Medicine

## 2012-12-22 DIAGNOSIS — R0989 Other specified symptoms and signs involving the circulatory and respiratory systems: Secondary | ICD-10-CM | POA: Insufficient documentation

## 2012-12-22 DIAGNOSIS — E663 Overweight: Secondary | ICD-10-CM | POA: Insufficient documentation

## 2012-12-22 DIAGNOSIS — I208 Other forms of angina pectoris: Secondary | ICD-10-CM

## 2012-12-22 DIAGNOSIS — I252 Old myocardial infarction: Secondary | ICD-10-CM | POA: Insufficient documentation

## 2012-12-22 DIAGNOSIS — I2510 Atherosclerotic heart disease of native coronary artery without angina pectoris: Secondary | ICD-10-CM | POA: Insufficient documentation

## 2012-12-22 DIAGNOSIS — I209 Angina pectoris, unspecified: Secondary | ICD-10-CM

## 2012-12-22 DIAGNOSIS — I4510 Unspecified right bundle-branch block: Secondary | ICD-10-CM | POA: Insufficient documentation

## 2012-12-22 DIAGNOSIS — R0609 Other forms of dyspnea: Secondary | ICD-10-CM | POA: Insufficient documentation

## 2012-12-22 MED ORDER — TECHNETIUM TC 99M SESTAMIBI GENERIC - CARDIOLITE
10.8000 | Freq: Once | INTRAVENOUS | Status: AC | PRN
  Administered 2012-12-22: 11 via INTRAVENOUS

## 2012-12-22 MED ORDER — TECHNETIUM TC 99M SESTAMIBI GENERIC - CARDIOLITE
30.4000 | Freq: Once | INTRAVENOUS | Status: AC | PRN
  Administered 2012-12-22: 30 via INTRAVENOUS

## 2012-12-22 MED ORDER — REGADENOSON 0.4 MG/5ML IV SOLN
0.4000 mg | Freq: Once | INTRAVENOUS | Status: AC
  Administered 2012-12-22: 0.4 mg via INTRAVENOUS

## 2012-12-22 MED ORDER — AMINOPHYLLINE 25 MG/ML IV SOLN
75.0000 mg | Freq: Once | INTRAVENOUS | Status: AC
  Administered 2012-12-22: 75 mg via INTRAVENOUS

## 2012-12-22 NOTE — Procedures (Addendum)
[facility] [address] [place] [address] [place] [address] [phone]  Cardiology Nuclear Med Study  Adam [name] is a 79 y.o. male     MRN : [id number]     DOB: 1926-05-14  Procedure Date: 12/22/2012  Nuclear Med Background Indication for Stress Test:  Graft Patency, Stent Patency and Post Hospital History:  CAD;MI;CABG X3--1996;STENT/PTCA--07/2009;PACER;ISCHEMIC CARDIOMYOPATHY Cardiac Risk Factors: Family History - CAD, History of Smoking, LBBB, Lipids and Overweight  Symptoms:  Dizziness, DOE, Fatigue, Light-Headedness and Left arm pain.   Nuclear Pre-Procedure Caffeine/Decaff Intake:  7:00pm NPO After: 5:00am   IV Site: R Forearm  IV 0.9% NS with Angio Cath:  22g  Chest Size (in):  42"  IV Started by: Emmit Pomfret, RN  Height: 5\' 11"  (1.803 m)  Cup Size: n/a  BMI:  Body mass index is 25.53 kg/(m^2). Weight:  183 lb (83.008 kg)   Tech Comments:  n/a    Nuclear Med Study 1 or 2 day study: 1 day  Stress Test Type:  Lexiscan  Order Authorizing Provider:  Nanetta Batty, MD   Resting Radionuclide: Technetium 43m Sestamibi  Resting Radionuclide Dose: 10.8 mCi   Stress Radionuclide:  Technetium 77m Sestamibi  Stress Radionuclide Dose: 30.4 mCi           Stress Protocol Rest HR: 70 Stress HR: 72  Rest BP: 108/81 Stress BP:129/82  Exercise Time (min): n/a METS: n/a          Dose of Adenosine (mg):  n/a Dose of Lexiscan: 0.4 mg  Dose of Atropine (mg): n/a Dose of Dobutamine: n/a mcg/kg/min (at max HR)  Stress Test Technologist: Ernestene Mention, CCT Nuclear Technologist: Gonzella Lex, CNMT   Rest Procedure:  Myocardial perfusion imaging was performed at rest 45 minutes following the intravenous administration of Technetium 48m Sestamibi. Stress Procedure:  The patient received IV Lexiscan 0.4 mg over 15-seconds.  Technetium 75m Sestamibi injected at 30-seconds.  Due to patient's shortness of breath and dizziness, he was given  IV Aminophylline 75 mg. Symptoms were resolved during recovery. There were no significant changes with Lexiscan.  Quantitative spect images were obtained after a 45 minute delay.  Transient Ischemic Dilatation (Normal <1.22):  1.19 Lung/Heart Ratio (Normal <0.45):  0.32 QGS EDV:  140 ml QGS ESV:  79 ml LV Ejection Fraction: 44%  Rest ECG: NSR-RBBB  Stress ECG: No significant ST segment change suggestive of ischemia.  QPS Raw Data Images:  Normal; no motion artifact; normal heart/lung ratio. Stress Images:  There is decreased uptake in the inferior wall. Rest Images:  There is decreased uptake in the inferior wall. Subtraction (SDS):  There is a fixed defect that is most consistent with a previous infarction.  Impression Exercise Capacity:  Lexiscan with no exercise. BP Response:  Normal blood pressure response. Clinical Symptoms:  No significant symptoms noted. ECG Impression:  No significant ST segment change suggestive of ischemia. Comparison with Prior Nuclear Study: No significant change from previous study  Overall Impression:  Intermediate risk stress nuclear study with fixed inferior defect (previously seen), suggestive of scar. No new reversible ischemia is appreciated.  LV Wall Motion:  LVEF 44%, inferior akinesis.  Chrystie Nose, MD, [facility] Board Certified in Nuclear Cardiology Attending Cardiologist [facility]   Chrystie Nose, MD  12/22/2012 1:22 PM

## 2012-12-23 ENCOUNTER — Encounter: Payer: Self-pay | Admitting: *Deleted

## 2012-12-23 NOTE — Progress Notes (Signed)
Pt. Informed that he needed to have a post hospital visit with Dr. Royann Shivers. Pt. Wants schedulers to call him in the morning to set up an appt.

## 2012-12-25 ENCOUNTER — Encounter: Payer: Self-pay | Admitting: *Deleted

## 2012-12-29 ENCOUNTER — Encounter: Payer: Self-pay | Admitting: *Deleted

## 2013-01-05 ENCOUNTER — Telehealth: Payer: Self-pay | Admitting: Cardiology

## 2013-01-05 NOTE — Telephone Encounter (Signed)
Pt's wife called saying pt had chest pain and took NTG X 2. She wanted to know if he could be seen tomorrow in the office. He knows to go to the ER if his symptoms recurr.  Corine Shelter PA-C 01/05/2013 5:59 PM

## 2013-01-06 ENCOUNTER — Ambulatory Visit (INDEPENDENT_AMBULATORY_CARE_PROVIDER_SITE_OTHER): Payer: Medicare Other | Admitting: Cardiovascular Disease

## 2013-01-06 ENCOUNTER — Ambulatory Visit: Payer: Medicare Other | Admitting: Cardiology

## 2013-01-06 ENCOUNTER — Encounter: Payer: Self-pay | Admitting: Cardiology

## 2013-01-06 VITALS — BP 110/60 | HR 70 | Resp 16 | Ht 70.0 in | Wt 186.0 lb

## 2013-01-06 DIAGNOSIS — I2510 Atherosclerotic heart disease of native coronary artery without angina pectoris: Secondary | ICD-10-CM

## 2013-01-06 DIAGNOSIS — R079 Chest pain, unspecified: Secondary | ICD-10-CM

## 2013-01-06 DIAGNOSIS — I498 Other specified cardiac arrhythmias: Secondary | ICD-10-CM

## 2013-01-06 DIAGNOSIS — R001 Bradycardia, unspecified: Secondary | ICD-10-CM

## 2013-01-06 MED ORDER — ISOSORBIDE MONONITRATE ER 30 MG PO TB24: 30.0000 mg | ORAL_TABLET | Freq: Every day | ORAL | Status: DC

## 2013-01-06 MED ORDER — PANTOPRAZOLE SODIUM 40 MG PO TBEC: 40.0000 mg | DELAYED_RELEASE_TABLET | Freq: Two times a day (BID) | ORAL | Status: DC

## 2013-01-06 NOTE — Assessment & Plan Note (Signed)
CABG (LIMA-LAD, SVG-RCA, SVG-OM in 1996).  07/2009 BMS to SVG-RCA. Cath in 04/2010 with patent stents

## 2013-01-06 NOTE — Patient Instructions (Signed)
We added isosorbide 30 mg to your meds to help prevent pain.  I increased your protonix to twice a day.    Keep your appointment to see Dr. Royann Shivers on the 9th.  If further problems call us.  See your primary MD for neck xrays and arm numbness.

## 2013-01-06 NOTE — Assessment & Plan Note (Signed)
Pain to numbness, not always with chest pain.  To follow up with PCP for cervical neck xrays.

## 2013-01-06 NOTE — Progress Notes (Signed)
01/06/2013   PCP: Juline Patch, MD   Chief Complaint  Patient presents with  . Follow-up    pt. had CP yesterday and took 3 NTG 0.4; also having numbness and tingling in bilat. hands    Primary Cardiologist: Dr. Royann Shivers  HPI: 79 y/o male, followed by Dr. Royann Shivers, who presented 12/15/12 for evaluation of chest pain. He has an extensive history of coronary disease. He reports that he apparently suffered a 2 silent MIs in 1995/1996 and underwent bypass surgery in 1996. LIMA-LAD, SVG-RCA,SVG-OM. He is graft dependent and had patent bypasses to all 3 major coronary territories by cardiac catheterization in 2012. This included a widely patent stent in the saphenous vein graft bypass to the right coronary artery placed in 2011. He has mild to moderate left ventricular systolic dysfunction with an EF of 41% by nuclear scintigraphy and 45% by echocardiography. There is evidence of grade 1 diastolic dysfunction. He requires intermittent diuretic therapy. Severe symptomatic orthostatic hypotension has led to discontinuation of beta blockers and ACE inhibitors. He also has a Artist DR RF [id number] dual-chamber permanent pacemaker, implanted March 2010 for sinus node dysfunction. His last office visit with Dr. Royann Shivers was 08/19/12, and at that time, he was stable from a cardiovascular standpoint.  He presented to [facility] 12/15/12 for evaluation of left arm pain, numbness and tingling that started earlier this morning. He denies any chest pain. He has noticed more DOE over the last several weeks and has had to use his PRN diuretic more often. He denies orthopnea and PND. He notes mild LEE. He denies any speech or visual changes with his arm numbness. He felt a bit dizzy, but no syncope/near syncope. No extremity weakness. No past history of stroke or TIA. He called EMS and was transported to the ED for evaluation. A 12 lead EKG demonstrated LBBB, not noted on prior EKGs. Though he was AV pacing.  POC troponin unremarkable. Neuro exam normal. Vitals stable.He was admitted to obs. And cardiac enzymes remained neg.  The next day he had no further discomfort and was ready for discharge. We will do outpt Lexiscan and then follow up with Dr. Royann Shivers. He was seen found stable and discharged by DR. Allyson Sabal. When he is not pacing he is in RBBB.  He called last pm with complaints of chest pain took NTG X 2 with some improvement, with the 3rd NTG pain resolved. It did return but mildly and he went to bed.  No pain currently.  No nausea.  At times with the chest pain he has bilateral arm/hand numbness but he has this without the chest pain as well.     Allergies  Allergen Reactions  . Altace [Ramipril]     Cough  . Penicillins Rash  . Sulfa Antibiotics Rash    Current Outpatient Prescriptions  Medication Sig Dispense Refill  . aspirin EC 81 MG EC tablet Take 1 tablet (81 mg total) by mouth daily.      . clobetasol cream (TEMOVATE) 0.05 % Apply 1 application topically daily as needed (for irritation).       . clopidogrel (PLAVIX) 75 MG tablet Take 75 mg by mouth daily.      . diazepam (VALIUM) 5 MG tablet Take 5 mg by mouth daily as needed. For anxiety      . levothyroxine (SYNTHROID, LEVOTHROID) 100 MCG tablet Take 100 mcg by mouth daily.      . metoprolol tartrate (LOPRESSOR)  25 MG tablet Take 12.5 mg by mouth 2 (two) times daily.       . nitroGLYCERIN (NITROSTAT) 0.4 MG SL tablet Place 0.4 mg under the tongue every 5 (five) minutes x 3 doses as needed for chest pain.      . Nutritional Supplements (VITAMIN D MAINTENANCE PO) Take 5,000 Units by mouth daily.      . pantoprazole (PROTONIX) 40 MG tablet Take 1 tablet (40 mg total) by mouth 2 (two) times daily.  60 tablet  3  . pravastatin (PRAVACHOL) 80 MG tablet Take 40 mg by mouth daily.      Marland Kitchen torsemide (DEMADEX) 20 MG tablet Take 20-40 mg by mouth daily as needed (for swelling).      . zolpidem (AMBIEN) 10 MG tablet Take 10 mg by mouth at  bedtime as needed for sleep.      . isosorbide mononitrate (IMDUR) 30 MG 24 hr tablet Take 1 tablet (30 mg total) by mouth daily.  90 tablet  3   No current facility-administered medications for this visit.    Past Medical History  Diagnosis Date  . CAD (coronary artery disease)     CABG (LIMA-LAD, SVG-RCA, SVG-OM in 1996).  07/2009 BMS to SVG-RCA. Cath in 04/2010 with patent stents   . Bradycardia     AFib/SSS s/p St Jude PPM 04/12/2008  . Hypothyroid   . Pacemaker   . Hyperlipidemia   . GERD (gastroesophageal reflux disease)   . Hiatal hernia   . Insomnia   . S/P CABG x 4   . RBBB   . Melanoma of back 1976  . Heart murmur     "just told I had one today" (12/15/2012)  . Myocardial infarction 1996; 2011    "both silent" (12/15/2012)  . Exertional shortness of breath     "sometimes walking" (12/15/2012)  . Arthritis     "minor, back and sometimes knees" (12/15/2012)    Past Surgical History  Procedure Laterality Date  . US echocardiography  07/11/2009    EF 45-50%  . Nm myoview ltd  06/2011    low risk  . Coronary artery bypass graft  1996    LIMA to LAD,SVG to RCA & SVG to OM  . Tonsillectomy  1938  . Appendectomy  1942  . Coronary angioplasty with stent placement  07/2009    bare metal stent to SVG to the RCA  . Cardiac catheterization  04/2010    LIMA to LAD patent,SVG to OM patent,no in-stnet restenosis RCA  . Cataract extraction w/ intraocular lens  implant, bilateral Bilateral 2012  . Melanoma excision  05/1974 X2    "taken off my back" (12/15/2012)  . Transurethral resection of prostate  1986  . Insert / replace / remove pacemaker  2010    ZOX:WRUEAVW:UJ colds or fevers, no weight changes Skin:no rashes or ulcers HEENT:no blurred vision, no congestion CV:see HPI PUL:see HPI GI:no diarrhea constipation or melena, no indigestion GU:no hematuria, no dysuria MS:no joint pain, no claudication Neuro:no syncope, no lightheadedness Endo:no diabetes, no thyroid  disease  PHYSICAL EXAM  BP 110/60  Pulse 70  Ht 5\' 10"  (1.778 m)  Wt 186 lb (84.369 kg)  BMI 26.69 kg/m2 General:Pleasant affect, NAD Skin:Warm and dry, brisk capillary refill HEENT:normocephalic, sclera clear, mucus membranes moist Neck:supple, no JVD, no bruits, no pain to palpation of neck  Heart:S1S2 RRR with soft 1/6 systolic murmur, no gallup, rub or click Lungs:clear without rales, rhonchi, or wheezes WJX:BJYN, non tender, +  BS, do not palpate liver spleen or masses Ext:no lower ext edema, 2+ pedal pulses, 2+ radial pulses Neuro:alert and oriented X 3, MAE, follows commands, + facial symmetry  EKG: Pacing SR . LVH no acute changes.  ASSESSMENT AND PLAN Chest pain Recent negative lexiscan myoview. But episode of chest pain, relief after 3rd NTG. May be associated with GERD,  Stable cath in 2012.  Added Imdur and increased Protonix.  Dr. Royann Shivers saw as well.  He will see him back 01/12/13.  Will treat GI and cardiac. No cardiac cath for now will try medicines.    CAD (coronary artery disease) CABG (LIMA-LAD, SVG-RCA, SVG-OM in 1996).  07/2009 BMS to SVG-RCA. Cath in 04/2010 with patent stents   Arm pain, possible anginal equivialnt  Pain to numbness, not always with chest pain.  To follow up with PCP for cervical neck xrays.  Bradycardia AFib/SSS s/p St Jude PPM-pacing

## 2013-01-06 NOTE — Assessment & Plan Note (Signed)
AFib/SSS s/p St Jude PPM-pacing

## 2013-01-06 NOTE — Assessment & Plan Note (Signed)
Recent negative lexiscan myoview. But episode of chest pain, relief after 3rd NTG. May be associated with GERD,  Stable cath in 2012.  Added Imdur and increased Protonix.  Dr. Royann Shivers saw as well.  He will see him back 01/12/13.  Will treat GI and cardiac. No cardiac cath for now will try medicines.

## 2013-01-07 ENCOUNTER — Encounter: Payer: Self-pay | Admitting: Cardiology

## 2013-01-12 ENCOUNTER — Ambulatory Visit (INDEPENDENT_AMBULATORY_CARE_PROVIDER_SITE_OTHER): Payer: Medicare Other | Admitting: Cardiovascular Disease

## 2013-01-12 ENCOUNTER — Encounter: Payer: Self-pay | Admitting: Cardiovascular Disease

## 2013-01-12 VITALS — BP 120/80 | HR 104 | Ht 71.0 in | Wt 185.2 lb

## 2013-01-12 DIAGNOSIS — I2510 Atherosclerotic heart disease of native coronary artery without angina pectoris: Secondary | ICD-10-CM

## 2013-01-12 DIAGNOSIS — R079 Chest pain, unspecified: Secondary | ICD-10-CM

## 2013-01-12 NOTE — Patient Instructions (Signed)
Increase Torsemide to 20mg  every other day.  Your physician recommends that you weigh, daily, at the same time every day, and in the same amount of clothing. Please record your daily weights on the handout provided and bring it to your next appointment.   Dr. Royann Shivers  recommends that you schedule a follow-up appointment in: 6 weeks.

## 2013-01-15 ENCOUNTER — Telehealth: Payer: Self-pay | Admitting: Cardiovascular Disease

## 2013-01-15 NOTE — Telephone Encounter (Signed)
Calling you concerning a letter he received from you.He said Dr Allyson Sabal was [facility] doctor [facility]-that why he was listed as his doctor.

## 2013-01-15 NOTE — Telephone Encounter (Signed)
lmom 

## 2013-01-18 ENCOUNTER — Encounter: Payer: Self-pay | Admitting: Cardiovascular Disease

## 2013-01-18 ENCOUNTER — Encounter: Payer: Self-pay | Admitting: *Deleted

## 2013-01-18 NOTE — Progress Notes (Signed)
Patient ID: Adam [name], male   DOB: [birth year], 79 y.o.   MRN: [id number]     Reason for office visit CAD; F/U after hospitalization   He presented 12/15/12 for evaluation of chest pain, left arm pain and bilateral upper extremity tingling, partially improved with sublingual nitroglycerin. Workup in the hospital was negative. Isosorbide mononitrate was added. Proton pump inhibitors were increased. He was discharged with plan for outpatient nuclear stress test. This showed no change from previous studies. There was a fixed inferior wall defect consistent with scar in the territory of the right coronary artery. There is no evidence of reversible defects. LVEF was 44% with inferior akinesis.   Yesterday he had numbness and tingling in both hands and took 3 sublingual nitroglycerin tablets. It is not clear whether these i no symptoms. He still has tingling in his hands  He has an extensive history of coronary disease. He reports that he apparently suffered a 2 silent MIs in 1995/1996 and underwent bypass surgery in 1996. LIMA-LAD, SVG-RCA,SVG-OM. He is graft dependent and had patent bypasses to all 3 major coronary territories by cardiac catheterization in 2012. This included a widely patent stent in the saphenous vein graft bypass to the right coronary artery placed in 2011. He has mild to moderate left ventricular systolic dysfunction with an EF of 41% by nuclear scintigraphy and 45% by echocardiography. There is evidence of grade 1 diastolic dysfunction. He requires intermittent diuretic therapy. Severe symptomatic orthostatic hypotension has led to discontinuation of beta blockers and ACE inhibitors. He also has a Artist DR RF [id number] dual-chamber permanent pacemaker, implanted March 2010 for sinus node dysfunction.   Allergies  Allergen Reactions  . Altace [Ramipril]     Cough  . Penicillins Rash  . Sulfa Antibiotics Rash    Current Outpatient Prescriptions  Medication Sig Dispense  Refill  . aspirin EC 81 MG EC tablet Take 1 tablet (81 mg total) by mouth daily.      . clobetasol cream (TEMOVATE) 0.05 % Apply 1 application topically daily as needed (for irritation).       . clopidogrel (PLAVIX) 75 MG tablet Take 75 mg by mouth daily.      . diazepam (VALIUM) 5 MG tablet Take 5 mg by mouth daily as needed. For anxiety      . levothyroxine (SYNTHROID, LEVOTHROID) 100 MCG tablet Take 100 mcg by mouth daily.      . metoprolol tartrate (LOPRESSOR) 25 MG tablet Take 12.5 mg by mouth 2 (two) times daily.       . nitroGLYCERIN (NITROSTAT) 0.4 MG SL tablet Place 0.4 mg under the tongue every 5 (five) minutes x 3 doses as needed for chest pain.      . Nutritional Supplements (VITAMIN D MAINTENANCE PO) Take 5,000 Units by mouth daily.      . pantoprazole (PROTONIX) 40 MG tablet Take 1 tablet (40 mg total) by mouth 2 (two) times daily.  60 tablet  3  . pravastatin (PRAVACHOL) 80 MG tablet Take 40 mg by mouth daily.      Marland Kitchen torsemide (DEMADEX) 20 MG tablet Take 20-40 mg by mouth daily as needed (for swelling).      . zolpidem (AMBIEN) 10 MG tablet Take 10 mg by mouth at bedtime as needed for sleep.      . isosorbide mononitrate (IMDUR) 30 MG 24 hr tablet Take 1 tablet (30 mg total) by mouth daily.  90 tablet  3   No current facility-administered  medications for this visit.    Past Medical History  Diagnosis Date  . CAD (coronary artery disease)     CABG (LIMA-LAD, SVG-RCA, SVG-OM in 1996).  07/2009 BMS to SVG-RCA. Cath in 04/2010 with patent stents   . Bradycardia     AFib/SSS s/p St Jude PPM 04/12/2008  . Hypothyroid   . Pacemaker   . Hyperlipidemia   . GERD (gastroesophageal reflux disease)   . Hiatal hernia   . Insomnia   . S/P CABG x 4   . RBBB   . Melanoma of back 1976  . Heart murmur     "just told I had one today" (12/15/2012)  . Myocardial infarction 1996; 2011    "both silent" (12/15/2012)  . Exertional shortness of breath     "sometimes walking" (12/15/2012)  .  Arthritis     "minor, back and sometimes knees" (12/15/2012)    Past Surgical History  Procedure Laterality Date  . US echocardiography  07/11/2009    EF 45-50%  . Nm myoview ltd  06/2011    low risk  . Coronary artery bypass graft  1996    LIMA to LAD,SVG to RCA & SVG to OM  . Tonsillectomy  1938  . Appendectomy  1942  . Coronary angioplasty with stent placement  07/2009    bare metal stent to SVG to the RCA  . Cardiac catheterization  04/2010    LIMA to LAD patent,SVG to OM patent,no in-stnet restenosis RCA  . Cataract extraction w/ intraocular lens  implant, bilateral Bilateral 2012  . Melanoma excision  05/1974 X2    "taken off my back" (12/15/2012)  . Transurethral resection of prostate  1986  . Insert / replace / remove pacemaker  2010    Family History  Problem Relation Age of Onset  . Coronary artery disease Mother   . Coronary artery disease Father     History   Social History  . Marital Status: Married    Spouse Name: N/A    Number of Children: N/A  . Years of Education: N/A   Occupational History  . Not on file.   Social History Main Topics  . Smoking status: Former Smoker -- 2 years    Types: Cigarettes  . Smokeless tobacco: Never Used     Comment: 12/15/2012 Hasn't had a cigarette since he was 79 years old and even then it was occasional  . Alcohol Use: No  . Drug Use: No  . Sexual Activity: No   Other Topics Concern  . Not on file   Social History Narrative  . No narrative on file    Review of systems: The patient specifically denies  dyspnea at rest or with exertion, orthopnea, paroxysmal nocturnal dyspnea, syncope, palpitations, focal neurological deficits, intermittent claudication, lower extremity edema, unexplained weight gain, cough, hemoptysis or wheezing.  The patient also denies abdominal pain, nausea, vomiting, dysphagia, diarrhea, constipation, polyuria, polydipsia, dysuria, hematuria, frequency, urgency, abnormal bleeding or bruising,  fever, chills, unexpected weight changes, mood swings, change in skin or hair texture, change in voice quality, auditory or visual problems, allergic reactions or rashes, new musculoskeletal complaints other than usual "aches and pains".   PHYSICAL EXAM BP 110/60  Pulse 70  Resp 16  Ht 5\' 10"  (1.778 m)  Wt 186 lb (84.369 kg)  BMI 26.69 kg/m2 General: Alert, oriented x3, no distress  Head: no evidence of trauma, PERRL, EOMI, no exophtalmos or lid lag, no myxedema, no xanthelasma; normal ears, nose and  oropharynx  Neck: normal jugular venous pulsations and no hepatojugular reflux; brisk carotid pulses without delay and no carotid bruits  Chest: clear to auscultation, no signs of consolidation by percussion or palpation, normal fremitus, symmetrical and full respiratory excursions  Cardiovascular: normal position and quality of the apical impulse, regular rhythm, normal first and widely split second heart sounds, nno rubs or gallops, grade 1/6 soft systolic murmur at the apex  Abdomen: no tenderness or distention, no masses by palpation, no abnormal pulsatility or arterial bruits, normal bowel sounds, no hepatosplenomegaly  Extremities: no clubbing, cyanosis or edema; 2+ radial, ulnar and brachial pulses bilaterally; 2+ right femoral, posterior tibial and dorsalis pedis pulses; 2+ left femoral, posterior tibial and dorsalis pedis pulses; no subclavian or femoral bruits  Neurological: grossly nonfocal   EKG: AV sequentially paced.  Lipid Panel     Component Value Date/Time   CHOL  Value: 93        ATP III CLASSIFICATION:  <200     mg/dL   Desirable  960-454  mg/dL   Borderline High  >=098    mg/dL   High        02/23/1476 0354   TRIG 98 09/19/2009 0354   HDL 34* 09/19/2009 0354   CHOLHDL 2.7 09/19/2009 0354   VLDL 20 09/19/2009 0354   LDLCALC  Value: 39        Total Cholesterol/HDL:CHD Risk Coronary Heart Disease Risk Table                     Men   Women  1/2 Average Risk   3.4   3.3  Average  Risk       5.0   4.4  2 X Average Risk   9.6   7.1  3 X Average Risk  23.4   11.0        Use the calculated Patient Ratio above and the CHD Risk Table to determine the patient's CHD Risk.        ATP III CLASSIFICATION (LDL):  <100     mg/dL   Optimal  295-621  mg/dL   Near or Above                    Optimal  130-159  mg/dL   Borderline  308-657  mg/dL   High  >846     mg/dL   Very High 9/62/9528 4132    BMET    Component Value Date/Time   NA 140 12/16/2012 0735   K 4.2 12/16/2012 0735   CL 104 12/16/2012 0735   CO2 26 12/16/2012 0735   GLUCOSE 95 12/16/2012 0735   BUN 15 12/16/2012 0735   CREATININE 0.78 12/16/2012 0735   CALCIUM 8.9 12/16/2012 0735   GFRNONAA 81* 12/16/2012 0735   GFRAA >90 12/16/2012 0735     ASSESSMENT AND PLAN Chest pain Recent negative lexiscan myoview. But episode of chest pain, relief after 3rd NTG. May be associated with GERD,  Stable cath in 2012.  Added Imdur and increased Protonix.  Dr. Royann Shivers saw as well.  He will see him back 01/12/13.  Will treat GI and cardiac. No cardiac cath for now will try medicines.    CAD (coronary artery disease) CABG (LIMA-LAD, SVG-RCA, SVG-OM in 1996).  07/2009 BMS to SVG-RCA. Cath in 04/2010 with patent stents   Arm pain, possible anginal equivialnt  Pain to numbness, not always with chest pain.  To follow up with PCP for cervical  neck xrays.  Bradycardia AFib/SSS s/p St Jude PPM-pacing   Patient Instructions  We added isosorbide 30 mg to your meds to help prevent pain.  I increased your protonix to twice a day.    Keep your appointment to see Dr. Royann Shivers on the 9th.  If further problems call us.  See your primary MD for neck xrays and arm numbness.    Orders Placed This Encounter  Procedures  . EKG 12-Lead   Meds ordered this encounter  Medications  . isosorbide mononitrate (IMDUR) 30 MG 24 hr tablet    Sig: Take 1 tablet (30 mg total) by mouth daily.    Dispense:  90 tablet    Refill:  3    Order  Specific Question:  Supervising Provider    Answer:  Runell Gess [[id number]]  . pantoprazole (PROTONIX) 40 MG tablet    Sig: Take 1 tablet (40 mg total) by mouth 2 (two) times daily.    Dispense:  60 tablet    Refill:  3    Order Specific Question:  Supervising Provider    Answer:  Runell Gess [[id number]]    Junious Silk, MD, [facility] [phone] office [phone] pager

## 2013-01-18 NOTE — Progress Notes (Signed)
Patient ID: Adam [name], male   DOB: Sep 17, 1926, 79 y.o.   MRN: [id number]      Reason for office visit Followup chest pain/CAD  Adam [name] returns in followup after made a few changes in his medications. He was recently hospitalized for atypical chest discomfort, but is most prominent complaint is recurrent numbness in both upper chimneys. Nitroglycerin has provided occasional relief. His recent nuclear stress test showed an extensive old inferior scar and an unchanged EF of 44%. There is no evidence of ischemia. His last catheterization performed in 2012 shows that he is graft dependent but that all the coronary territories were gently receiving good blood flow via bypasses, including a previously stented saphenous vein graft bypass to the right coronary artery (that mostly now serves an area of scar). Decreased abdominal pain since last visit and change in meds. Only one episode of mild discomfort relieved a single sublingual NTG. No change in his class II shortness of breath with activity such as walking. Occasional lightheadedness specially with changes in position, that has limited use of antianginal medications.    Allergies  Allergen Reactions  . Altace [Ramipril]     Cough  . Penicillins Rash  . Sulfa Antibiotics Rash    Current Outpatient Prescriptions  Medication Sig Dispense Refill  . aspirin EC 81 MG EC tablet Take 1 tablet (81 mg total) by mouth daily.      . clobetasol cream (TEMOVATE) 0.05 % Apply 1 application topically daily as needed (for irritation).       . clopidogrel (PLAVIX) 75 MG tablet Take 75 mg by mouth daily.      . diazepam (VALIUM) 5 MG tablet Take 5 mg by mouth daily as needed. For anxiety      . isosorbide mononitrate (IMDUR) 30 MG 24 hr tablet Take 1 tablet (30 mg total) by mouth daily.  90 tablet  3  . levothyroxine (SYNTHROID, LEVOTHROID) 100 MCG tablet Take 100 mcg by mouth daily.      . metoprolol tartrate (LOPRESSOR) 25 MG tablet Take 12.5 mg by  mouth 2 (two) times daily.       . nitroGLYCERIN (NITROSTAT) 0.4 MG SL tablet Place 0.4 mg under the tongue every 5 (five) minutes x 3 doses as needed for chest pain.      . Nutritional Supplements (VITAMIN D MAINTENANCE PO) Take 5,000 Units by mouth daily.      . pantoprazole (PROTONIX) 40 MG tablet Take 1 tablet (40 mg total) by mouth 2 (two) times daily.  60 tablet  3  . pravastatin (PRAVACHOL) 80 MG tablet Take 40 mg by mouth daily.      Marland Kitchen torsemide (DEMADEX) 20 MG tablet Take 20-40 mg by mouth daily as needed (for swelling).      . zolpidem (AMBIEN) 10 MG tablet Take 10 mg by mouth at bedtime as needed for sleep.       No current facility-administered medications for this visit.    Past Medical History  Diagnosis Date  . CAD (coronary artery disease)     CABG (LIMA-LAD, SVG-RCA, SVG-OM in 1996).  07/2009 BMS to SVG-RCA. Cath in 04/2010 with patent stents   . Bradycardia     AFib/SSS s/p St Jude PPM 04/12/2008  . Hypothyroid   . Pacemaker   . Hyperlipidemia   . GERD (gastroesophageal reflux disease)   . Hiatal hernia   . Insomnia   . S/P CABG x 4   . RBBB   . Melanoma  of back 1976  . Heart murmur     "just told I had one today" (12/15/2012)  . Myocardial infarction 1996; 2011    "both silent" (12/15/2012)  . Exertional shortness of breath     "sometimes walking" (12/15/2012)  . Arthritis     "minor, back and sometimes knees" (12/15/2012)    Past Surgical History  Procedure Laterality Date  . US echocardiography  07/11/2009    EF 45-50%  . Nm myoview ltd  06/2011    low risk  . Coronary artery bypass graft  1996    LIMA to LAD,SVG to RCA & SVG to OM  . Tonsillectomy  1938  . Appendectomy  1942  . Coronary angioplasty with stent placement  07/2009    bare metal stent to SVG to the RCA  . Cardiac catheterization  04/2010    LIMA to LAD patent,SVG to OM patent,no in-stnet restenosis RCA  . Cataract extraction w/ intraocular lens  implant, bilateral Bilateral 2012  . Melanoma  excision  05/1974 X2    "taken off my back" (12/15/2012)  . Transurethral resection of prostate  1986  . Insert / replace / remove pacemaker  2010    Family History  Problem Relation Age of Onset  . Coronary artery disease Mother   . Coronary artery disease Father     History   Social History  . Marital Status: Married    Spouse Name: N/A    Number of Children: N/A  . Years of Education: N/A   Occupational History  . Not on file.   Social History Main Topics  . Smoking status: Former Smoker -- 2 years    Types: Cigarettes  . Smokeless tobacco: Never Used     Comment: 12/15/2012 Hasn't had a cigarette since he was 79 years old and even then it was occasional  . Alcohol Use: No  . Drug Use: No  . Sexual Activity: No   Other Topics Concern  . Not on file   Social History Narrative  . No narrative on file    Review of systems: The patient specifically denies any chest pain at rest or with exertion, dyspnea at rest or with exertion, orthopnea, paroxysmal nocturnal dyspnea, syncope, palpitations, focal neurological deficits, intermittent claudication, unexplained weight gain, cough, hemoptysis or wheezing.  The patient also denies abdominal pain, nausea, vomiting, dysphagia, diarrhea, constipation, polyuria, polydipsia, dysuria, hematuria, frequency, urgency, abnormal bleeding or bruising, fever, chills, unexpected weight changes, mood swings, change in skin or hair texture, change in voice quality, auditory or visual problems, allergic reactions or rashes, new musculoskeletal complaints other than usual "aches and pains".   PHYSICAL EXAM BP 120/80  Pulse 104  Ht 5\' 11"  (1.803 m)  Wt 185 lb 3.2 oz (84.006 kg)  BMI 25.84 kg/m2 General: Alert, oriented x3, no distress  Head: no evidence of trauma, PERRL, EOMI, no exophtalmos or lid lag, no myxedema, no xanthelasma; normal ears, nose and oropharynx  Neck: normal jugular venous pulsations and no hepatojugular reflux; brisk  carotid pulses without delay and no carotid bruits  Chest: clear to auscultation, no signs of consolidation by percussion or palpation, normal fremitus, symmetrical and full respiratory excursions  Cardiovascular: normal position and quality of the apical impulse, regular rhythm, normal first and widely split second heart sounds, nno rubs or gallops, grade 1/6 soft systolic murmur at the apex  Abdomen: no tenderness or distention, no masses by palpation, no abnormal pulsatility or arterial bruits, normal bowel sounds, no hepatosplenomegaly  Extremities: no clubbing, cyanosis or edema; 2+ radial, ulnar and brachial pulses bilaterally; 2+ right femoral, posterior tibial and dorsalis pedis pulses; 2+ left femoral, posterior tibial and dorsalis pedis pulses; no subclavian or femoral bruits  Neurological: grossly nonfocal   EKG: AV sequentially paced  Lipid Panel     Component Value Date/Time   CHOL  Value: 93        ATP III CLASSIFICATION:  <200     mg/dL   Desirable  161-096  mg/dL   Borderline High  >=045    mg/dL   High        05/13/8117 0354   TRIG 98 09/19/2009 0354   HDL 34* 09/19/2009 0354   CHOLHDL 2.7 09/19/2009 0354   VLDL 20 09/19/2009 0354   LDLCALC  Value: 39        Total Cholesterol/HDL:CHD Risk Coronary Heart Disease Risk Table                     Men   Women  1/2 Average Risk   3.4   3.3  Average Risk       5.0   4.4  2 X Average Risk   9.6   7.1  3 X Average Risk  23.4   11.0        Use the calculated Patient Ratio above and the CHD Risk Table to determine the patient's CHD Risk.        ATP III CLASSIFICATION (LDL):  <100     mg/dL   Optimal  147-829  mg/dL   Near or Above                    Optimal  130-159  mg/dL   Borderline  562-130  mg/dL   High  >865     mg/dL   Very High 7/84/6962 9528    BMET    Component Value Date/Time   NA 140 12/16/2012 0735   K 4.2 12/16/2012 0735   CL 104 12/16/2012 0735   CO2 26 12/16/2012 0735   GLUCOSE 95 12/16/2012 0735   BUN 15 12/16/2012 0735     CREATININE 0.78 12/16/2012 0735   CALCIUM 8.9 12/16/2012 0735   GFRNONAA 81* 12/16/2012 0735   GFRAA >90 12/16/2012 0735     ASSESSMENT AND PLAN  His symptoms are mostly atypical. His upper trimming the numbness and tingling sounds neuropathic and is most likely due to cervical spine disease. I think has nothing to do with his coronary problems. His symptoms seem to have improved mostly after the addition of a proton pump inhibitor. We discussed the fact that while nuclear myocardial perfusion studies are not diagnostically perfect, they generally provide good prognostic information. I don't think repeat invasive angiography is indicated at this time.  Orders Placed This Encounter  Procedures  . EKG 12-Lead   No orders of the defined types were placed in this encounter.    Junious Silk, MD, [facility] [phone] office [phone] pager

## 2013-01-18 NOTE — Telephone Encounter (Signed)
I spoke with patient and he doesn't remember ever activating his my chart account.  I reviewed the myoview results and sent him a letter to sign back up for mychart.  I deactivated his account.

## 2013-02-23 ENCOUNTER — Encounter: Payer: Self-pay | Admitting: Cardiovascular Disease

## 2013-02-23 ENCOUNTER — Ambulatory Visit: Payer: Medicare Other | Admitting: Cardiovascular Disease

## 2013-02-23 ENCOUNTER — Ambulatory Visit (INDEPENDENT_AMBULATORY_CARE_PROVIDER_SITE_OTHER): Payer: Medicare Other | Admitting: Cardiovascular Disease

## 2013-02-23 VITALS — BP 102/60 | HR 70 | Ht 71.0 in | Wt 184.8 lb

## 2013-02-23 DIAGNOSIS — R001 Bradycardia, unspecified: Secondary | ICD-10-CM

## 2013-02-23 DIAGNOSIS — I2589 Other forms of chronic ischemic heart disease: Secondary | ICD-10-CM

## 2013-02-23 DIAGNOSIS — Z950 Presence of cardiac pacemaker: Secondary | ICD-10-CM

## 2013-02-23 DIAGNOSIS — I2510 Atherosclerotic heart disease of native coronary artery without angina pectoris: Secondary | ICD-10-CM

## 2013-02-23 DIAGNOSIS — I498 Other specified cardiac arrhythmias: Secondary | ICD-10-CM

## 2013-02-23 DIAGNOSIS — I255 Ischemic cardiomyopathy: Secondary | ICD-10-CM

## 2013-02-23 LAB — PACEMAKER DEVICE OBSERVATION

## 2013-02-23 NOTE — Patient Instructions (Addendum)
Remote monitoring is used to monitor your Pacemaker of ICD from home. This monitoring reduces the number of office visits required to check your device to one time per year. It allows [place] to keep an eye on the functioning of your device to ensure it is working properly. You are scheduled for a device check from home on May 27, 2013. You may send your transmission at any time that day. If you have a wireless device, the transmission will be sent automatically. After your physician reviews your transmission, you will receive a postcard with your next transmission date.  Your physician recommends that you schedule a follow-up appointment in: 6 months

## 2013-02-27 LAB — PACEMAKER DEVICE OBSERVATION

## 2013-02-28 NOTE — Assessment & Plan Note (Signed)
Currently with minimal angina symptoms, albeit with a rather sedentary lifestyle. He is graft dependent.

## 2013-02-28 NOTE — Progress Notes (Signed)
Patient ID: Adam [name], male   DOB: [birth year], 80 y.o.   MRN: [id number]     Reason for office visit CAD status post CABG, mild ischemic cardiomyopathy, sinus node dysfunction status post dual-chamber pacemaker  Adam [name] is generally feeling well. He has class II dyspnea on exertion but is no longer troubled by angina or dominant pain. She has an extensive history of coronary disease and underwent bypass surgery in 1996 and a stent to the saphenous vein graft to the right coronary artery in 2011, shown to be patent by followup catheterization in 2012. His native coronary arteries are occluded and he is graft dependent. He has scintigraphic evidence of an extensive inferior wall scar and his ejection fraction is roughly 45%. Heart failure symptoms are generally well controlled. He has a tendency for hypotension which limits aggressive medical therapy.. intravenous fluids have been required on occasion in the past for symptomatic hypotension.  Interrogation of his pacemaker today shows normal device function. His device is a St. Jude RF device implanted in 2010. There is 87% atrial pacing and 56% ventricular pacing, comparable to historical trends. The underlying rhythm is severe sinus bradycardia in the 30s. There have been no episodes of atrial fibrillation recorded. His next followup will be via the care and system   Allergies  Allergen Reactions  . Altace [Ramipril]     Cough  . Penicillins Rash  . Sulfa Antibiotics Rash    Current Outpatient Prescriptions  Medication Sig Dispense Refill  . aspirin EC 81 MG EC tablet Take 1 tablet (81 mg total) by mouth daily.      . clobetasol cream (TEMOVATE) 2.69 % Apply 1 application topically daily as needed (for irritation).       . clopidogrel (PLAVIX) 75 MG tablet Take 75 mg by mouth daily.      . diazepam (VALIUM) 5 MG tablet Take 5 mg by mouth daily as needed. For anxiety      . isosorbide mononitrate (IMDUR) 30 MG 24 hr tablet Take 1 tablet  (30 mg total) by mouth daily.  90 tablet  3  . levothyroxine (SYNTHROID, LEVOTHROID) 100 MCG tablet Take 100 mcg by mouth daily.      . metoprolol tartrate (LOPRESSOR) 25 MG tablet Take 12.5 mg by mouth 2 (two) times daily.       . nitroGLYCERIN (NITROSTAT) 0.4 MG SL tablet Place 0.4 mg under the tongue every 5 (five) minutes x 3 doses as needed for chest pain.      . Nutritional Supplements (VITAMIN D MAINTENANCE PO) Take 5,000 Units by mouth daily.      . pantoprazole (PROTONIX) 40 MG tablet Take 1 tablet (40 mg total) by mouth 2 (two) times daily.  60 tablet  3  . pravastatin (PRAVACHOL) 80 MG tablet Take 40 mg by mouth daily.      Marland Kitchen torsemide (DEMADEX) 20 MG tablet Take 20 mg by mouth every other day.       . zolpidem (AMBIEN) 10 MG tablet Take 10 mg by mouth at bedtime as needed for sleep.       No current facility-administered medications for this visit.    Past Medical History  Diagnosis Date  . CAD (coronary artery disease)     CABG (LIMA-LAD, SVG-RCA, SVG-OM in 1996).  07/2009 BMS to SVG-RCA. Cath in 04/2010 with patent stents   . Bradycardia     AFib/SSS s/p St Jude PPM 04/12/2008  . Hypothyroid   . Pacemaker   .  Hyperlipidemia   . GERD (gastroesophageal reflux disease)   . Hiatal hernia   . Insomnia   . S/P CABG x 4   . RBBB   . Melanoma of back 1976  . Heart murmur     "just told I had one today" (12/15/2012)  . Myocardial infarction 1996; 2011    "both silent" (12/15/2012)  . Exertional shortness of breath     "sometimes walking" (12/15/2012)  . Arthritis     "minor, back and sometimes knees" (12/15/2012)    Past Surgical History  Procedure Laterality Date  . US echocardiography  07/11/2009    EF 45-50%  . Nm myoview ltd  06/2011    low risk  . Coronary artery bypass graft  1996    LIMA to LAD,SVG to RCA & SVG to OM  . Tonsillectomy  1938  . Appendectomy  1942  . Coronary angioplasty with stent placement  07/2009    bare metal stent to SVG to the RCA  . Cardiac  catheterization  04/2010    LIMA to LAD patent,SVG to OM patent,no in-stnet restenosis RCA  . Cataract extraction w/ intraocular lens  implant, bilateral Bilateral 2012  . Melanoma excision  05/1974 X2    "taken off my back" (12/15/2012)  . Transurethral resection of prostate  1986  . Insert / replace / remove pacemaker  2010    Family History  Problem Relation Age of Onset  . Coronary artery disease Mother   . Coronary artery disease Father     History   Social History  . Marital Status: Married    Spouse Name: N/A    Number of Children: N/A  . Years of Education: N/A   Occupational History  . Not on file.   Social History Main Topics  . Smoking status: Former Smoker -- 2 years    Types: Cigarettes  . Smokeless tobacco: Never Used     Comment: 12/15/2012 Hasn't had a cigarette since he was 80 years old and even then it was occasional  . Alcohol Use: No  . Drug Use: No  . Sexual Activity: No   Other Topics Concern  . Not on file   Social History Narrative  . No narrative on file    Review of systems: The patient specifically denies any chest pain at rest or with usual exertion, dyspnea at rest or with exertion, orthopnea, paroxysmal nocturnal dyspnea, syncope, palpitations, focal neurological deficits, intermittent claudication, unexplained weight gain, cough, hemoptysis or wheezing.  The patient also denies abdominal pain, nausea, vomiting, dysphagia, diarrhea, constipation, polyuria, polydipsia, dysuria, hematuria, frequency, urgency, abnormal bleeding or bruising, fever, chills, unexpected weight changes, mood swings, change in skin or hair texture, change in voice quality, auditory or visual problems, allergic reactions or rashes, new musculoskeletal complaints other than usual "aches and pains".   PHYSICAL EXAM BP 102/60  Pulse 70  Ht 5\' 11"  (1.803 m)  Wt 83.825 kg (184 lb 12.8 oz)  BMI 25.79 kg/m2 General: Alert, oriented x3, no distress  Head: no evidence of  trauma, PERRL, EOMI, no exophtalmos or lid lag, no myxedema, no xanthelasma; normal ears, nose and oropharynx  Neck: normal jugular venous pulsations and no hepatojugular reflux; brisk carotid pulses without delay and no carotid bruits  Chest: clear to auscultation, no signs of consolidation by percussion or palpation, normal fremitus, symmetrical and full respiratory excursions ; healthy subclavian pacemaker site Cardiovascular: normal position and quality of the apical impulse, regular rhythm, normal first and widely  split second heart sounds, nno rubs or gallops, grade 1/6 soft systolic murmur at the apex  Abdomen: no tenderness or distention, no masses by palpation, no abnormal pulsatility or arterial bruits, normal bowel sounds, no hepatosplenomegaly  Extremities: no clubbing, cyanosis or edema; 2+ radial, ulnar and brachial pulses bilaterally; 2+ right femoral, posterior tibial and dorsalis pedis pulses; 2+ left femoral, posterior tibial and dorsalis pedis pulses; no subclavian or femoral bruits  Neurological: grossly nonfocal     EKG: Atrial paced, ventricular sensed, right bundle branch block, prominent repolarization abnormalities in the anterolateral leads, unchanged from prior tracings   Lipid Panel  01/16/2012 total cholesterol 114, triglycerides 116, HDL 41, LDL 51 normal LFTs, creatinine 0.9, hemoglobin 14.9  BMET    Component Value Date/Time   NA 140 12/16/2012 0735   K 4.2 12/16/2012 0735   CL 104 12/16/2012 0735   CO2 26 12/16/2012 0735   GLUCOSE 95 12/16/2012 0735   BUN 15 12/16/2012 0735   CREATININE 0.78 12/16/2012 0735   CALCIUM 8.9 12/16/2012 0735   GFRNONAA 81* 12/16/2012 0735   GFRAA >90 12/16/2012 0735     ASSESSMENT AND PLAN CAD (coronary artery disease) Currently with minimal angina symptoms, albeit with a rather sedentary lifestyle. He is graft dependent.  Pacemaker Normal pacemaker function. He has a fairly high frequency ventricular pacing but this has  not been associated with clinical heart failure and we have been unable to reduce the frequency ventricular pacing the device reprogramming. Next check will be a remote download in April.  Cardiomyopathy, ischemic He has little if any evidence of clinical heart failure. No changes are made to his medications. Note a tendency to hypotension which limits doses of heart failure meds.   Orders Placed This Encounter  Procedures  . EKG 12-Lead  Eirene Rather   Patient Instructions  Remote monitoring is used to monitor your Pacemaker of ICD from home. This monitoring reduces the number of office visits required to check your device to one time per year. It allows [place] to keep an eye on the functioning of your device to ensure it is working properly. You are scheduled for a device check from home on May 27, 2013. You may send your transmission at any time that day. If you have a wireless device, the transmission will be sent automatically. After your physician reviews your transmission, you will receive a postcard with your next transmission date.  Your physician recommends that you schedule a follow-up appointment in: 6 months     [name], MD, [facility] [phone] office [phone] pager

## 2013-02-28 NOTE — Assessment & Plan Note (Signed)
Normal pacemaker function. He has a fairly high frequency ventricular pacing but this has not been associated with clinical heart failure and we have been unable to reduce the frequency ventricular pacing the device reprogramming. Next check will be a remote download in April.

## 2013-02-28 NOTE — Assessment & Plan Note (Signed)
He has little if any evidence of clinical heart failure. No changes are made to his medications. Note a tendency to hypotension which limits doses of heart failure meds.

## 2013-03-01 ENCOUNTER — Ambulatory Visit (INDEPENDENT_AMBULATORY_CARE_PROVIDER_SITE_OTHER): Payer: Medicare Other | Admitting: *Deleted

## 2013-03-01 DIAGNOSIS — I255 Ischemic cardiomyopathy: Secondary | ICD-10-CM

## 2013-03-01 DIAGNOSIS — I2589 Other forms of chronic ischemic heart disease: Secondary | ICD-10-CM

## 2013-03-02 LAB — MDC_IDC_ENUM_SESS_TYPE_REMOTE
Battery Remaining Longevity: 67 mo
Battery Voltage: 2.92 V
Brady Statistic AP VP Percent: 44 %
Brady Statistic AP VS Percent: 47 %
Brady Statistic AS VP Percent: 4.2 %
Brady Statistic AS VS Percent: 4.8 %
Brady Statistic RA Percent Paced: 90 %
Brady Statistic RV Percent Paced: 48 %
Date Time Interrogation Session: 20150124074646
Implantable Pulse Generator Model: 2210
Implantable Pulse Generator Serial Number: 2173363
Lead Channel Impedance Value: 410 Ohm
Lead Channel Impedance Value: 460 Ohm
Lead Channel Pacing Threshold Amplitude: 0.625 V
Lead Channel Pacing Threshold Amplitude: 0.75 V
Lead Channel Pacing Threshold Pulse Width: 0.4 ms
Lead Channel Pacing Threshold Pulse Width: 0.4 ms
Lead Channel Sensing Intrinsic Amplitude: 11.8 mV
Lead Channel Sensing Intrinsic Amplitude: 3.9 mV
Lead Channel Setting Pacing Amplitude: 0.875
Lead Channel Setting Pacing Amplitude: 2.5 V
Lead Channel Setting Pacing Pulse Width: 0.4 ms
Lead Channel Setting Sensing Sensitivity: 2 mV

## 2013-03-03 LAB — MDC_IDC_ENUM_SESS_TYPE_INCLINIC
Battery Remaining Longevity: 5.6
Battery Voltage: 2.92 V
Brady Statistic RA Percent Paced: 87 %
Brady Statistic RV Percent Paced: 56 %
Implantable Pulse Generator Model: 2210
Implantable Pulse Generator Serial Number: 2173363
Lead Channel Impedance Value: 400 Ohm
Lead Channel Impedance Value: 530 Ohm
Lead Channel Pacing Threshold Amplitude: 0.625 V
Lead Channel Pacing Threshold Amplitude: 0.75 V
Lead Channel Pacing Threshold Pulse Width: 0.4 ms
Lead Channel Pacing Threshold Pulse Width: 0.4 ms
Lead Channel Sensing Intrinsic Amplitude: 1.8 mV
Lead Channel Sensing Intrinsic Amplitude: 12 mV
Lead Channel Setting Pacing Amplitude: 0.875
Lead Channel Setting Pacing Amplitude: 2.5 V
Lead Channel Setting Pacing Pulse Width: 0.4 ms
Lead Channel Setting Sensing Sensitivity: 2 mV

## 2013-03-10 ENCOUNTER — Encounter: Payer: Self-pay | Admitting: *Deleted

## 2013-05-25 ENCOUNTER — Other Ambulatory Visit: Payer: Self-pay | Admitting: *Deleted

## 2013-05-25 MED ORDER — NITROGLYCERIN 0.4 MG SL SUBL: 0.4000 mg | SUBLINGUAL_TABLET | SUBLINGUAL | Status: DC | PRN

## 2013-05-25 NOTE — Telephone Encounter (Signed)
Rx was sent to pharmacy electronically. 

## 2013-05-31 ENCOUNTER — Ambulatory Visit (INDEPENDENT_AMBULATORY_CARE_PROVIDER_SITE_OTHER): Payer: Medicare Other | Admitting: *Deleted

## 2013-05-31 ENCOUNTER — Encounter: Payer: Self-pay | Admitting: Cardiovascular Disease

## 2013-05-31 DIAGNOSIS — I495 Sick sinus syndrome: Secondary | ICD-10-CM

## 2013-05-31 LAB — MDC_IDC_ENUM_SESS_TYPE_REMOTE
Battery Remaining Longevity: 67 mo
Battery Voltage: 2.92 V
Brady Statistic AP VP Percent: 51 %
Brady Statistic AP VS Percent: 39 %
Brady Statistic AS VP Percent: 6 %
Brady Statistic AS VS Percent: 3.6 %
Brady Statistic RA Percent Paced: 89 %
Brady Statistic RV Percent Paced: 57 %
Date Time Interrogation Session: 20150427082244
Implantable Pulse Generator Model: 2210
Implantable Pulse Generator Serial Number: 2173363
Lead Channel Impedance Value: 430 Ohm
Lead Channel Impedance Value: 530 Ohm
Lead Channel Pacing Threshold Amplitude: 0.625 V
Lead Channel Pacing Threshold Pulse Width: 0.4 ms
Lead Channel Sensing Intrinsic Amplitude: 12 mV
Lead Channel Sensing Intrinsic Amplitude: 4.8 mV
Lead Channel Setting Pacing Amplitude: 0.875
Lead Channel Setting Pacing Amplitude: 2.5 V
Lead Channel Setting Pacing Pulse Width: 0.4 ms
Lead Channel Setting Sensing Sensitivity: 2 mV

## 2013-06-15 ENCOUNTER — Encounter: Payer: Self-pay | Admitting: Cardiology

## 2013-07-22 ENCOUNTER — Other Ambulatory Visit (HOSPITAL_COMMUNITY): Payer: Self-pay

## 2013-07-22 DIAGNOSIS — R0600 Dyspnea, unspecified: Secondary | ICD-10-CM

## 2013-07-22 LAB — PULMONARY FUNCTION TEST

## 2013-07-28 ENCOUNTER — Encounter: Payer: Self-pay | Admitting: *Deleted

## 2013-07-28 ENCOUNTER — Encounter (HOSPITAL_COMMUNITY): Payer: Medicare Other

## 2013-08-04 ENCOUNTER — Ambulatory Visit (HOSPITAL_COMMUNITY)
Admission: RE | Admit: 2013-08-04 | Discharge: 2013-08-04 | Disposition: A | Discharge status: Home / Self Care | Payer: Medicare Other | Source: Ambulatory Visit | Attending: Internal Medicine | Admitting: Internal Medicine

## 2013-08-04 DIAGNOSIS — R0609 Other forms of dyspnea: Secondary | ICD-10-CM | POA: Insufficient documentation

## 2013-08-04 DIAGNOSIS — R0989 Other specified symptoms and signs involving the circulatory and respiratory systems: Secondary | ICD-10-CM | POA: Insufficient documentation

## 2013-08-04 MED ORDER — ALBUTEROL SULFATE (2.5 MG/3ML) 0.083% IN NEBU
2.5000 mg | INHALATION_SOLUTION | Freq: Once | RESPIRATORY_TRACT | Status: AC
  Administered 2013-08-04: 2.5 mg via RESPIRATORY_TRACT

## 2013-08-05 LAB — PULMONARY FUNCTION TEST
DL/VA % pred: 135 %
DL/VA: 6.29 ml/min/mmHg/L
DLCO cor % pred: 71 %
DLCO cor: 24.13 ml/min/mmHg
DLCO unc % pred: 71 %
DLCO unc: 24.13 ml/min/mmHg
FEF 25-75 Post: 2.16 L/sec
FEF 25-75 Pre: 1.72 L/sec
FEF2575-%Change-Post: 25 %
FEF2575-%Pred-Post: 117 %
FEF2575-%Pred-Pre: 93 %
FEV1-%Change-Post: 8 %
FEV1-%Pred-Post: 89 %
FEV1-%Pred-Pre: 82 %
FEV1-Post: 2.52 L
FEV1-Pre: 2.31 L
FEV1FVC-%Change-Post: 8 %
FEV1FVC-%Pred-Pre: 96 %
FEV6-%Change-Post: 4 %
FEV6-%Pred-Post: 90 %
FEV6-%Pred-Pre: 86 %
FEV6-Post: 3.36 L
FEV6-Pre: 3.2 L
FEV6FVC-%Change-Post: 0 %
FEV6FVC-%Pred-Post: 107 %
FEV6FVC-%Pred-Pre: 106 %
FVC-%Change-Post: 0 %
FVC-%Pred-Post: 85 %
FVC-%Pred-Pre: 84 %
FVC-Post: 3.4 L
FVC-Pre: 3.38 L
Post FEV1/FVC ratio: 74 %
Post FEV6/FVC ratio: 100 %
Pre FEV1/FVC ratio: 68 %
Pre FEV6/FVC Ratio: 99 %
RV % pred: 116 %
RV: 3.28 L
TLC % pred: 90 %
TLC: 6.56 L

## 2013-08-19 ENCOUNTER — Other Ambulatory Visit: Payer: Self-pay | Admitting: Cardiology

## 2013-08-19 NOTE — Telephone Encounter (Signed)
Rx was sent to pharmacy electronically. 

## 2013-09-15 ENCOUNTER — Telehealth: Payer: Self-pay | Admitting: Cardiovascular Disease

## 2013-09-15 NOTE — Telephone Encounter (Signed)
New Prob    Requesting a call from a nurse regarding Adam [name] (just approved) for pt. Please call.

## 2013-09-15 NOTE — Telephone Encounter (Signed)
Returned call to patient's wife she stated she would like to ask Dr.Croitoru if he would recommend Entresto for her husband.Will check with Dr.Croitoru and call her back.

## 2013-09-15 NOTE — Telephone Encounter (Signed)
Returned call to patient's wife spoke to Dr.Croitoru he advised patient's B/P too low to take Entresto.

## 2013-10-12 ENCOUNTER — Encounter: Payer: Self-pay | Admitting: Cardiovascular Disease

## 2013-10-12 ENCOUNTER — Ambulatory Visit (INDEPENDENT_AMBULATORY_CARE_PROVIDER_SITE_OTHER): Payer: Medicare Other | Admitting: Cardiovascular Disease

## 2013-10-12 VITALS — BP 100/62 | HR 71 | Ht 71.0 in | Wt 182.1 lb

## 2013-10-12 DIAGNOSIS — I2589 Other forms of chronic ischemic heart disease: Secondary | ICD-10-CM

## 2013-10-12 DIAGNOSIS — Z950 Presence of cardiac pacemaker: Secondary | ICD-10-CM

## 2013-10-12 DIAGNOSIS — I498 Other specified cardiac arrhythmias: Secondary | ICD-10-CM

## 2013-10-12 DIAGNOSIS — R001 Bradycardia, unspecified: Secondary | ICD-10-CM

## 2013-10-12 DIAGNOSIS — I2510 Atherosclerotic heart disease of native coronary artery without angina pectoris: Secondary | ICD-10-CM

## 2013-10-12 DIAGNOSIS — I255 Ischemic cardiomyopathy: Secondary | ICD-10-CM

## 2013-10-12 LAB — MDC_IDC_ENUM_SESS_TYPE_INCLINIC
Battery Remaining Longevity: 74.4 mo
Battery Voltage: 2.9 V
Brady Statistic RA Percent Paced: 86 %
Brady Statistic RV Percent Paced: 54 %
Date Time Interrogation Session: 20150908151131
Implantable Pulse Generator Model: 2210
Implantable Pulse Generator Serial Number: 2173363
Lead Channel Impedance Value: 412.5 Ohm
Lead Channel Impedance Value: 525 Ohm
Lead Channel Pacing Threshold Amplitude: 0.625 V
Lead Channel Pacing Threshold Amplitude: 0.75 V
Lead Channel Pacing Threshold Amplitude: 0.75 V
Lead Channel Pacing Threshold Pulse Width: 0.4 ms
Lead Channel Pacing Threshold Pulse Width: 0.4 ms
Lead Channel Pacing Threshold Pulse Width: 0.4 ms
Lead Channel Sensing Intrinsic Amplitude: 12 mV
Lead Channel Sensing Intrinsic Amplitude: 2.4 mV
Lead Channel Setting Pacing Amplitude: 0.875
Lead Channel Setting Pacing Amplitude: 2.5 V
Lead Channel Setting Pacing Pulse Width: 0.4 ms
Lead Channel Setting Sensing Sensitivity: 2 mV

## 2013-10-12 NOTE — Assessment & Plan Note (Signed)
Normal device function. No permanent reprogramming changes were necessary today. High-frequency ventricular pacing persists and we have been unable to mitigate this with any type of reprogramming strategy. Biventricular pacing does not appear justified at this point in this 80 year old gentleman with class II symptoms.

## 2013-10-12 NOTE — Patient Instructions (Signed)
Remote monitoring is used to monitor your Pacemaker or ICD from home. This monitoring reduces the number of office visits required to check your device to one time per year. It allows [place] to monitor the functioning of your device to ensure it is working properly. You are scheduled for a device check from home on 01/13/2014. You may send your transmission at any time that day. If you have a wireless device, the transmission will be sent automatically. After your physician reviews your transmission, you will receive a postcard with your next transmission date.  Dr. Sallyanne Kuster recommends that you schedule a follow-up appointment in: 6 MONTHS

## 2013-10-12 NOTE — Assessment & Plan Note (Signed)
NYHA functional class II status, mild intermittent hypervolemia, primarily manifest as ankle edema. He does not tolerate ACE inhibitors due to cough, but is on beta blockers and intermittent loop diuretics. Angiotensin receptor blockers are considered but felt to be contraindicated because of his tendency to symptomatic hypotension.

## 2013-10-12 NOTE — Assessment & Plan Note (Signed)
He has occluded native arteries and is graft dependent (LIMA to LAD, SVG to RCA, SVG to OM; status post stent in SVG to RCA 2011, patent by cath March 2012). November 2014 nuclear study shows inferior scar without ischemia, EF 41%.

## 2013-10-12 NOTE — Progress Notes (Signed)
Patient ID: Adam [name], male   DOB: 10/23/1926, 80 y.o.   MRN: [id number]      Reason for office visit CAD status post CABG, mild ischemic cardiomyopathy, sinus node dysfunction status post dual-chamber pacemaker  Adam [name] feels quite well. He exercises daily on a stationary bike without much problems. He does not have shortness of breath or chest pain. He has occasional swelling in his ankles. Taking his diuretic every other day seems to be providing the optimal balance between edema control and symptomatic hypotension. Very rarely he has to take the diuretic 2 days in row.  Pacemaker interrogation shows normal device functions. Battery longevity is estimated to be around 6 years. All the parameters were excellent. He has 86% atrial pacing and 54% ventricular pacing which is very similar to previous device checks. No significant atrial or ventricular tachyarrhythmia has been recorded. Heart rate histogram distribution is slightly blunted, but likely appropriate for his level of activity.  He has an extensive history of coronary disease and underwent bypass surgery in 1996 and a stent to the saphenous vein graft to the right coronary artery in 2011, shown to be patent by followup catheterization in 2012. His native coronary arteries are occluded and he is graft dependent. He has scintigraphic evidence of an extensive inferior wall scar and his ejection fraction is roughly 45%. Heart failure symptoms are generally well controlled. He has a tendency for hypotension which limits aggressive medical therapy.. intravenous fluids have been required on occasion in the past for symptomatic hypotension   Allergies  Allergen Reactions  . Altace [Ramipril]     Cough  . Penicillins Rash  . Sulfa Antibiotics Rash    Current Outpatient Prescriptions  Medication Sig Dispense Refill  . aspirin EC 81 MG EC tablet Take 1 tablet (81 mg total) by mouth daily.      . clobetasol cream (TEMOVATE) 0.53 % Apply 1  application topically daily as needed (for irritation).       . clopidogrel (PLAVIX) 75 MG tablet Take 75 mg by mouth daily.      . diazepam (VALIUM) 5 MG tablet Take 5 mg by mouth daily as needed. For anxiety      . levothyroxine (SYNTHROID, LEVOTHROID) 100 MCG tablet Take 100 mcg by mouth daily.      . metoprolol tartrate (LOPRESSOR) 25 MG tablet Take 12.5 mg by mouth 2 (two) times daily.       . nitroGLYCERIN (NITROSTAT) 0.4 MG SL tablet Place 1 tablet (0.4 mg total) under the tongue every 5 (five) minutes x 3 doses as needed for chest pain.  25 tablet  3  . Nutritional Supplements (VITAMIN D MAINTENANCE PO) Take 5,000 Units by mouth daily.      . pantoprazole (PROTONIX) 40 MG tablet TAKE 1 TABLET BY MOUTH TWICE A DAY  60 tablet  5  . pravastatin (PRAVACHOL) 80 MG tablet Take 40 mg by mouth daily.      Marland Kitchen torsemide (DEMADEX) 20 MG tablet Take 20 mg by mouth every other day.       . zolpidem (AMBIEN) 10 MG tablet Take 10 mg by mouth at bedtime as needed for sleep.       No current facility-administered medications for this visit.    Past Medical History  Diagnosis Date  . CAD (coronary artery disease)     CABG (LIMA-LAD, SVG-RCA, SVG-OM in 1996).  07/2009 BMS to SVG-RCA. Cath in 04/2010 with patent stents   . Bradycardia  AFib/SSS s/p St Jude PPM 04/12/2008  . Hypothyroid   . Pacemaker   . Hyperlipidemia   . GERD (gastroesophageal reflux disease)   . Hiatal hernia   . Insomnia   . S/P CABG x 4   . RBBB   . Melanoma of back 1976  . Heart murmur     "just told I had one today" (12/15/2012)  . Myocardial infarction 1996; 2011    "both silent" (12/15/2012)  . Exertional shortness of breath     "sometimes walking" (12/15/2012)  . Arthritis     "minor, back and sometimes knees" (12/15/2012)    Past Surgical History  Procedure Laterality Date  . US echocardiography  07/11/2009    EF 45-50%  . Nm myoview ltd  06/2011    low risk  . Coronary artery bypass graft  1996    LIMA to  LAD,SVG to RCA & SVG to OM  . Tonsillectomy  1938  . Appendectomy  1942  . Coronary angioplasty with stent placement  07/2009    bare metal stent to SVG to the RCA  . Cardiac catheterization  04/2010    LIMA to LAD patent,SVG to OM patent,no in-stnet restenosis RCA  . Cataract extraction w/ intraocular lens  implant, bilateral Bilateral 2012  . Melanoma excision  05/1974 X2    "taken off my back" (12/15/2012)  . Transurethral resection of prostate  1986  . Insert / replace / remove pacemaker  2010    Family History  Problem Relation Age of Onset  . Coronary artery disease Mother   . Coronary artery disease Father     History   Social History  . Marital Status: Married    Spouse Name: N/A    Number of Children: N/A  . Years of Education: N/A   Occupational History  . Not on file.   Social History Main Topics  . Smoking status: Former Smoker -- 2 years    Types: Cigarettes  . Smokeless tobacco: Never Used     Comment: 12/15/2012 Hasn't had a cigarette since he was 80 years old and even then it was occasional  . Alcohol Use: No  . Drug Use: No  . Sexual Activity: No   Other Topics Concern  . Not on file   Social History Narrative  . No narrative on file    Review of systems: The patient specifically denies any chest pain at rest or with usual exertion, dyspnea at rest or with exertion, orthopnea, paroxysmal nocturnal dyspnea, syncope, palpitations, focal neurological deficits, intermittent claudication, unexplained weight gain, cough, hemoptysis or wheezing.  The patient also denies abdominal pain, nausea, vomiting, dysphagia, diarrhea, constipation, polyuria, polydipsia, dysuria, hematuria, frequency, urgency, abnormal bleeding or bruising, fever, chills, unexpected weight changes, mood swings, change in skin or hair texture, change in voice quality, auditory or visual problems, allergic reactions or rashes, new musculoskeletal complaints other than usual "aches and  pains".   PHYSICAL EXAM BP 100/62  Pulse 71  Ht 5\' 11"  (1.803 m)  Wt 182 lb 1.6 oz (82.6 kg)  BMI 25.41 kg/m2 General: Alert, oriented x3, no distress  Head: no evidence of trauma, PERRL, EOMI, no exophtalmos or lid lag, no myxedema, no xanthelasma; normal ears, nose and oropharynx  Neck: normal jugular venous pulsations and no hepatojugular reflux; brisk carotid pulses without delay and no carotid bruits  Chest: clear to auscultation, no signs of consolidation by percussion or palpation, normal fremitus, symmetrical and full respiratory excursions ; healthy subclavian pacemaker  site  Cardiovascular: normal position and quality of the apical impulse, regular rhythm, normal first and widely split second heart sounds, nno rubs or gallops, grade 1/6 soft systolic murmur at the apex  Abdomen: no tenderness or distention, no masses by palpation, no abnormal pulsatility or arterial bruits, normal bowel sounds, no hepatosplenomegaly  Extremities: no clubbing, cyanosis or edema; 2+ radial, ulnar and brachial pulses bilaterally; 2+ right femoral, posterior tibial and dorsalis pedis pulses; 2+ left femoral, posterior tibial and dorsalis pedis pulses; no subclavian or femoral bruits  Neurological: grossly nonfocal      EKG: Atrial paced, ventricular sensed, right bundle branch block (QRS 168 ms) ST depression in the inferolateral leads. QTC 491 ms; no significant change  Lipid Panel     Component Value Date/Time   CHOL  Value: 93        ATP III CLASSIFICATION:  <200     mg/dL   Desirable  200-239  mg/dL   Borderline High  >=240    mg/dL   High        09/19/2009 0354   TRIG 98 09/19/2009 0354   HDL 34* 09/19/2009 0354   CHOLHDL 2.7 09/19/2009 0354   VLDL 20 09/19/2009 0354   LDLCALC  Value: 39        Total Cholesterol/HDL:CHD Risk Coronary Heart Disease Risk Table                     Men   Women  1/2 Average Risk   3.4   3.3  Average Risk       5.0   4.4  2 X Average Risk   9.6   7.1  3 X Average Risk   23.4   11.0        Use the calculated Patient Ratio above and the CHD Risk Table to determine the patient's CHD Risk.        ATP III CLASSIFICATION (LDL):  <100     mg/dL   Optimal  100-129  mg/dL   Near or Above                    Optimal  130-159  mg/dL   Borderline  160-189  mg/dL   High  >190     mg/dL   Very High 09/19/2009 0354    BMET    Component Value Date/Time   NA 140 12/16/2012 0735   K 4.2 12/16/2012 0735   CL 104 12/16/2012 0735   CO2 26 12/16/2012 0735   GLUCOSE 95 12/16/2012 0735   BUN 15 12/16/2012 0735   CREATININE 0.78 12/16/2012 0735   CALCIUM 8.9 12/16/2012 0735   GFRNONAA 81* 12/16/2012 0735   GFRAA >90 12/16/2012 0735     ASSESSMENT AND PLAN CAD (coronary artery disease) He has occluded native arteries and is graft dependent (LIMA to LAD, SVG to RCA, SVG to OM; status post stent in SVG to RCA 2011, patent by cath March 2012). November 2014 nuclear study shows inferior scar without ischemia, EF 41%.  Cardiomyopathy, ischemic NYHA functional class II status, mild intermittent hypervolemia, primarily manifest as ankle edema. He does not tolerate ACE inhibitors due to cough, but is on beta blockers and intermittent loop diuretics. Angiotensin receptor blockers are considered but felt to be contraindicated because of his tendency to symptomatic hypotension.  Pacemaker Normal device function. No permanent reprogramming changes were necessary today. High-frequency ventricular pacing persists and we have been unable to mitigate this with  any type of reprogramming strategy. Biventricular pacing does not appear justified at this point in this 80 year old gentleman with class II symptoms.   Orders Placed This Encounter  Procedures  . Implantable device check  . EKG 12-Lead   No orders of the defined types were placed in this encounter.    Holli Humbles, MD, [place] [phone] office [phone] pager

## 2013-10-22 ENCOUNTER — Encounter: Payer: Self-pay | Admitting: Cardiovascular Disease

## 2013-12-09 ENCOUNTER — Other Ambulatory Visit: Payer: Self-pay | Admitting: Internal Medicine

## 2013-12-09 DIAGNOSIS — H44811 Hemophthalmos, right eye: Secondary | ICD-10-CM

## 2013-12-09 DIAGNOSIS — R51 Headache: Secondary | ICD-10-CM

## 2013-12-09 DIAGNOSIS — R42 Dizziness and giddiness: Secondary | ICD-10-CM

## 2013-12-09 DIAGNOSIS — R519 Headache, unspecified: Secondary | ICD-10-CM

## 2013-12-10 ENCOUNTER — Ambulatory Visit
Admission: RE | Admit: 2013-12-10 | Discharge: 2013-12-10 | Disposition: A | Discharge status: Home / Self Care | Payer: 59 | Source: Ambulatory Visit | Attending: Internal Medicine | Admitting: Internal Medicine

## 2013-12-10 DIAGNOSIS — R42 Dizziness and giddiness: Secondary | ICD-10-CM

## 2013-12-10 DIAGNOSIS — R519 Headache, unspecified: Secondary | ICD-10-CM

## 2013-12-10 DIAGNOSIS — R51 Headache: Secondary | ICD-10-CM

## 2013-12-10 DIAGNOSIS — H44811 Hemophthalmos, right eye: Secondary | ICD-10-CM

## 2014-01-13 ENCOUNTER — Ambulatory Visit (INDEPENDENT_AMBULATORY_CARE_PROVIDER_SITE_OTHER): Payer: Medicare Other | Admitting: *Deleted

## 2014-01-13 ENCOUNTER — Encounter: Payer: Self-pay | Admitting: Cardiovascular Disease

## 2014-01-13 DIAGNOSIS — I495 Sick sinus syndrome: Secondary | ICD-10-CM

## 2014-01-13 LAB — MDC_IDC_ENUM_SESS_TYPE_REMOTE
Battery Remaining Longevity: 55 mo
Battery Remaining Percentage: 48 %
Battery Voltage: 2.89 V
Brady Statistic AP VP Percent: 45 %
Brady Statistic AP VS Percent: 41 %
Brady Statistic AS VP Percent: 8.4 %
Brady Statistic AS VS Percent: 5.2 %
Brady Statistic RA Percent Paced: 85 %
Brady Statistic RV Percent Paced: 53 %
Date Time Interrogation Session: 20151210073915
Implantable Pulse Generator Model: 2210
Implantable Pulse Generator Serial Number: 2173363
Lead Channel Impedance Value: 410 Ohm
Lead Channel Impedance Value: 490 Ohm
Lead Channel Pacing Threshold Amplitude: 0.625 V
Lead Channel Pacing Threshold Pulse Width: 0.4 ms
Lead Channel Sensing Intrinsic Amplitude: 12 mV
Lead Channel Sensing Intrinsic Amplitude: 4.2 mV
Lead Channel Setting Pacing Amplitude: 0.875
Lead Channel Setting Pacing Amplitude: 2.5 V
Lead Channel Setting Pacing Pulse Width: 0.4 ms
Lead Channel Setting Sensing Sensitivity: 2 mV

## 2014-01-13 NOTE — Progress Notes (Signed)
Remote pacemaker transmission.   

## 2014-01-29 ENCOUNTER — Encounter (HOSPITAL_COMMUNITY): Payer: Self-pay | Admitting: Emergency Medicine

## 2014-01-29 ENCOUNTER — Emergency Department (HOSPITAL_COMMUNITY): Payer: Medicare Other

## 2014-01-29 ENCOUNTER — Observation Stay (HOSPITAL_COMMUNITY)
Admission: EM | Admit: 2014-01-29 | Discharge: 2014-02-01 | Disposition: A | Discharge status: Home / Self Care | Payer: Medicare Other | Attending: Internal Medicine | Admitting: Internal Medicine

## 2014-01-29 DIAGNOSIS — I255 Ischemic cardiomyopathy: Secondary | ICD-10-CM | POA: Diagnosis not present

## 2014-01-29 DIAGNOSIS — E876 Hypokalemia: Secondary | ICD-10-CM

## 2014-01-29 DIAGNOSIS — Z7982 Long term (current) use of aspirin: Secondary | ICD-10-CM | POA: Diagnosis not present

## 2014-01-29 DIAGNOSIS — Z951 Presence of aortocoronary bypass graft: Secondary | ICD-10-CM | POA: Insufficient documentation

## 2014-01-29 DIAGNOSIS — R42 Dizziness and giddiness: Secondary | ICD-10-CM

## 2014-01-29 DIAGNOSIS — E785 Hyperlipidemia, unspecified: Secondary | ICD-10-CM | POA: Diagnosis not present

## 2014-01-29 DIAGNOSIS — Z882 Allergy status to sulfonamides status: Secondary | ICD-10-CM | POA: Diagnosis not present

## 2014-01-29 DIAGNOSIS — Z950 Presence of cardiac pacemaker: Secondary | ICD-10-CM | POA: Insufficient documentation

## 2014-01-29 DIAGNOSIS — Z7902 Long term (current) use of antithrombotics/antiplatelets: Secondary | ICD-10-CM | POA: Diagnosis not present

## 2014-01-29 DIAGNOSIS — R079 Chest pain, unspecified: Secondary | ICD-10-CM | POA: Diagnosis not present

## 2014-01-29 DIAGNOSIS — Z888 Allergy status to other drugs, medicaments and biological substances status: Secondary | ICD-10-CM | POA: Insufficient documentation

## 2014-01-29 DIAGNOSIS — Z79899 Other long term (current) drug therapy: Secondary | ICD-10-CM | POA: Diagnosis not present

## 2014-01-29 DIAGNOSIS — I482 Chronic atrial fibrillation: Secondary | ICD-10-CM

## 2014-01-29 DIAGNOSIS — K219 Gastro-esophageal reflux disease without esophagitis: Secondary | ICD-10-CM | POA: Diagnosis not present

## 2014-01-29 DIAGNOSIS — I252 Old myocardial infarction: Secondary | ICD-10-CM | POA: Insufficient documentation

## 2014-01-29 DIAGNOSIS — K449 Diaphragmatic hernia without obstruction or gangrene: Secondary | ICD-10-CM | POA: Insufficient documentation

## 2014-01-29 DIAGNOSIS — E039 Hypothyroidism, unspecified: Secondary | ICD-10-CM | POA: Insufficient documentation

## 2014-01-29 DIAGNOSIS — Z880 Allergy status to penicillin: Secondary | ICD-10-CM | POA: Diagnosis not present

## 2014-01-29 DIAGNOSIS — Z87891 Personal history of nicotine dependence: Secondary | ICD-10-CM | POA: Diagnosis not present

## 2014-01-29 DIAGNOSIS — I2510 Atherosclerotic heart disease of native coronary artery without angina pectoris: Secondary | ICD-10-CM | POA: Diagnosis not present

## 2014-01-29 DIAGNOSIS — I495 Sick sinus syndrome: Secondary | ICD-10-CM

## 2014-01-29 DIAGNOSIS — E7800 Pure hypercholesterolemia, unspecified: Secondary | ICD-10-CM

## 2014-01-29 DIAGNOSIS — R140 Abdominal distension (gaseous): Secondary | ICD-10-CM

## 2014-01-29 DIAGNOSIS — R0789 Other chest pain: Secondary | ICD-10-CM

## 2014-01-29 LAB — HEPATIC FUNCTION PANEL
ALT: 17 U/L (ref 0–53)
AST: 32 U/L (ref 0–37)
Albumin: 3.7 g/dL (ref 3.5–5.2)
Alkaline Phosphatase: 58 U/L (ref 39–117)
Bilirubin, Direct: 0.2 mg/dL (ref 0.0–0.3)
Indirect Bilirubin: 0.5 mg/dL (ref 0.3–0.9)
Total Bilirubin: 0.7 mg/dL (ref 0.3–1.2)
Total Protein: 7 g/dL (ref 6.0–8.3)

## 2014-01-29 LAB — TROPONIN I: Troponin I: <0.03 ng/mL (ref <0.031)

## 2014-01-29 LAB — CBC
HCT: 41.7 % (ref 39.0–52.0)
Hemoglobin: 13.5 g/dL (ref 13.0–17.0)
MCH: 32.1 pg (ref 26.0–34.0)
MCHC: 32.4 g/dL (ref 30.0–36.0)
MCV: 99.3 fL (ref 78.0–100.0)
Platelets: 244 10*3/uL (ref 150–400)
RBC: 4.2 MIL/uL — ABNORMAL LOW (ref 4.22–5.81)
RDW: 14.5 % (ref 11.5–15.5)
WBC: 6.6 10*3/uL (ref 4.0–10.5)

## 2014-01-29 LAB — BASIC METABOLIC PANEL
Anion gap: 5 (ref 5–15)
BUN: 18 mg/dL (ref 6–23)
CO2: 28 mmol/L (ref 19–32)
Calcium: 9 mg/dL (ref 8.4–10.5)
Chloride: 105 mEq/L (ref 96–112)
Creatinine, Ser: 0.87 mg/dL (ref 0.50–1.35)
GFR calc Af Amer: 89 mL/min — ABNORMAL LOW (ref >90)
GFR calc non Af Amer: 77 mL/min — ABNORMAL LOW (ref >90)
Glucose, Bld: 131 mg/dL — ABNORMAL HIGH (ref 70–99)
Potassium: 4.3 mmol/L (ref 3.5–5.1)
Sodium: 138 mmol/L (ref 135–145)

## 2014-01-29 LAB — I-STAT TROPONIN, ED: Troponin i, poc: 0.01 ng/mL (ref 0.00–0.08)

## 2014-01-29 LAB — LIPASE, BLOOD: Lipase: 26 U/L (ref 11–59)

## 2014-01-29 LAB — BRAIN NATRIURETIC PEPTIDE: B Natriuretic Peptide: 50.1 pg/mL (ref 0.0–100.0)

## 2014-01-29 MED ORDER — PRAVASTATIN SODIUM 40 MG PO TABS
40.0000 mg | ORAL_TABLET | Freq: Every day | ORAL | Status: DC
  Administered 2014-01-30 – 2014-02-01 (×3): 40 mg via ORAL
  Filled 2014-01-29 (×3): qty 1

## 2014-01-29 MED ORDER — METOPROLOL TARTRATE 25 MG PO TABS
12.5000 mg | ORAL_TABLET | Freq: Two times a day (BID) | ORAL | Status: DC
  Administered 2014-01-30 – 2014-01-31 (×5): 12.5 mg via ORAL
  Filled 2014-01-29 (×6): qty 1

## 2014-01-29 MED ORDER — MORPHINE SULFATE 2 MG/ML IJ SOLN: 2.0000 mg | INTRAMUSCULAR | Status: DC | PRN

## 2014-01-29 MED ORDER — PANTOPRAZOLE SODIUM 40 MG PO TBEC
40.0000 mg | DELAYED_RELEASE_TABLET | Freq: Two times a day (BID) | ORAL | Status: DC
  Administered 2014-01-30 – 2014-02-01 (×6): 40 mg via ORAL
  Filled 2014-01-29 (×6): qty 1

## 2014-01-29 MED ORDER — HYDROCODONE-ACETAMINOPHEN 5-325 MG PO TABS: 1.0000 | ORAL_TABLET | ORAL | Status: DC | PRN

## 2014-01-29 MED ORDER — SODIUM CHLORIDE 0.9 % IJ SOLN
3.0000 mL | Freq: Two times a day (BID) | INTRAMUSCULAR | Status: DC
  Administered 2014-01-31 – 2014-02-01 (×2): 3 mL via INTRAVENOUS

## 2014-01-29 MED ORDER — ACETAMINOPHEN 325 MG PO TABS
650.0000 mg | ORAL_TABLET | Freq: Four times a day (QID) | ORAL | Status: DC | PRN
  Administered 2014-01-30: 650 mg via ORAL
  Filled 2014-01-29: qty 2

## 2014-01-29 MED ORDER — TORSEMIDE 20 MG PO TABS
20.0000 mg | ORAL_TABLET | ORAL | Status: DC
  Administered 2014-01-30 – 2014-02-01 (×2): 20 mg via ORAL
  Filled 2014-01-29 (×2): qty 1

## 2014-01-29 MED ORDER — ASPIRIN EC 81 MG PO TBEC
81.0000 mg | DELAYED_RELEASE_TABLET | Freq: Every day | ORAL | Status: DC
  Administered 2014-01-30 – 2014-02-01 (×3): 81 mg via ORAL
  Filled 2014-01-29 (×3): qty 1

## 2014-01-29 MED ORDER — SODIUM CHLORIDE 0.9 % IJ SOLN
3.0000 mL | INTRAMUSCULAR | Status: DC | PRN
  Administered 2014-01-30: 3 mL via INTRAVENOUS
  Filled 2014-01-29: qty 3

## 2014-01-29 MED ORDER — ZOLPIDEM TARTRATE 5 MG PO TABS: 5.0000 mg | ORAL_TABLET | Freq: Every evening | ORAL | Status: DC | PRN

## 2014-01-29 MED ORDER — SODIUM CHLORIDE 0.9 % IV SOLN: 250.0000 mL | INTRAVENOUS | Status: DC | PRN

## 2014-01-29 MED ORDER — ONDANSETRON HCL 4 MG PO TABS: 4.0000 mg | ORAL_TABLET | Freq: Four times a day (QID) | ORAL | Status: DC | PRN

## 2014-01-29 MED ORDER — ALBUTEROL SULFATE (2.5 MG/3ML) 0.083% IN NEBU: 2.5000 mg | INHALATION_SOLUTION | RESPIRATORY_TRACT | Status: DC | PRN

## 2014-01-29 MED ORDER — LEVOTHYROXINE SODIUM 100 MCG PO TABS
100.0000 ug | ORAL_TABLET | Freq: Every day | ORAL | Status: DC
  Administered 2014-01-30 – 2014-02-01 (×3): 100 ug via ORAL
  Filled 2014-01-29 (×3): qty 1

## 2014-01-29 MED ORDER — ONDANSETRON HCL 4 MG/2ML IJ SOLN: 4.0000 mg | Freq: Four times a day (QID) | INTRAMUSCULAR | Status: DC | PRN

## 2014-01-29 MED ORDER — ENOXAPARIN SODIUM 40 MG/0.4ML ~~LOC~~ SOLN
40.0000 mg | Freq: Every day | SUBCUTANEOUS | Status: DC
  Administered 2014-01-30 – 2014-01-31 (×3): 40 mg via SUBCUTANEOUS
  Filled 2014-01-29 (×3): qty 0.4

## 2014-01-29 MED ORDER — NITROGLYCERIN 0.4 MG SL SUBL: 0.4000 mg | SUBLINGUAL_TABLET | SUBLINGUAL | Status: DC | PRN

## 2014-01-29 MED ORDER — ASPIRIN 81 MG PO CHEW
324.0000 mg | CHEWABLE_TABLET | Freq: Once | ORAL | Status: AC
  Administered 2014-01-29: 324 mg via ORAL
  Filled 2014-01-29: qty 4

## 2014-01-29 MED ORDER — CLOPIDOGREL BISULFATE 75 MG PO TABS
75.0000 mg | ORAL_TABLET | Freq: Every day | ORAL | Status: DC
  Administered 2014-01-30 – 2014-02-01 (×3): 75 mg via ORAL
  Filled 2014-01-29 (×3): qty 1

## 2014-01-29 MED ORDER — SODIUM CHLORIDE 0.9 % IJ SOLN
3.0000 mL | Freq: Two times a day (BID) | INTRAMUSCULAR | Status: DC
  Administered 2014-01-30 – 2014-01-31 (×3): 3 mL via INTRAVENOUS

## 2014-01-29 MED ORDER — ACETAMINOPHEN 650 MG RE SUPP: 650.0000 mg | Freq: Four times a day (QID) | RECTAL | Status: DC | PRN

## 2014-01-29 NOTE — ED Provider Notes (Signed)
CSN: [id number]     Arrival date & time 01/29/14  1927 History   First MD Initiated Contact with Patient 01/29/14 2048     Chief Complaint  Patient presents with  . Chest Pain     (Consider location/radiation/quality/duration/timing/severity/associated sxs/prior Treatment) Patient is a 80 y.o. male presenting with chest pain.  Chest Pain Pain location:  Substernal area Pain quality: pressure   Pain radiates to:  Does not radiate Pain radiates to the back: no   Pain severity:  Moderate Onset quality:  Gradual Duration:  3 days Timing:  Intermittent Progression:  Worsening Chronicity:  New Context comment:  Prior CAD sp CABG Relieved by:  Nothing Worsened by:  Exertion and movement Associated symptoms: diaphoresis and nausea   Associated symptoms: no abdominal pain, no cough, no fever and not vomiting     Past Medical History  Diagnosis Date  . CAD (coronary artery disease)     CABG (LIMA-LAD, SVG-RCA, SVG-OM in 1996).  07/2009 BMS to SVG-RCA. Cath in 04/2010 with patent stents   . Bradycardia     AFib/SSS s/p St Jude PPM 04/12/2008  . Hypothyroid   . Pacemaker   . Hyperlipidemia   . GERD (gastroesophageal reflux disease)   . Hiatal hernia   . Insomnia   . S/P CABG x 4   . RBBB   . Melanoma of back 1976  . Heart murmur     "just told I had one today" (12/15/2012)  . Myocardial infarction 1996; 2011    "both silent" (12/15/2012)  . Exertional shortness of breath     "sometimes walking" (12/15/2012)  . Arthritis     "minor, back and sometimes knees" (12/15/2012)   Past Surgical History  Procedure Laterality Date  . US echocardiography  07/11/2009    EF 45-50%  . Nm myoview ltd  06/2011    low risk  . Coronary artery bypass graft  1996    LIMA to LAD,SVG to RCA & SVG to OM  . Tonsillectomy  1938  . Appendectomy  1942  . Coronary angioplasty with stent placement  07/2009    bare metal stent to SVG to the RCA  . Cardiac catheterization  04/2010    LIMA to LAD  patent,SVG to OM patent,no in-stnet restenosis RCA  . Cataract extraction w/ intraocular lens  implant, bilateral Bilateral 2012  . Melanoma excision  05/1974 X2    "taken off my back" (12/15/2012)  . Transurethral resection of prostate  1986  . Insert / replace / remove pacemaker  2010   Family History  Problem Relation Age of Onset  . Coronary artery disease Mother   . Coronary artery disease Father    History  Substance Use Topics  . Smoking status: Former Smoker -- 2 years    Types: Cigarettes  . Smokeless tobacco: Never Used     Comment: 12/15/2012 Hasn't had a cigarette since he was 80 years old and even then it was occasional  . Alcohol Use: No    Review of Systems  Constitutional: Positive for diaphoresis. Negative for fever.  Respiratory: Negative for cough.   Cardiovascular: Positive for chest pain.  Gastrointestinal: Positive for nausea. Negative for vomiting and abdominal pain.  All other systems reviewed and are negative.     Allergies  Altace; Penicillins; and Sulfa antibiotics  Home Medications   Prior to Admission medications   Medication Sig Start Date End Date Taking? Authorizing Provider  aspirin EC 81 MG EC tablet Take 1  tablet (81 mg total) by mouth daily. 12/16/12  Yes Isaiah Serge, NP  clobetasol cream (TEMOVATE) 1.60 % Apply 1 application topically daily as needed (for irritation).    Yes Historical Provider, MD  clopidogrel (PLAVIX) 75 MG tablet Take 75 mg by mouth daily.   Yes Historical Provider, MD  levothyroxine (SYNTHROID, LEVOTHROID) 100 MCG tablet Take 100 mcg by mouth daily.   Yes Historical Provider, MD  metoprolol tartrate (LOPRESSOR) 25 MG tablet Take 12.5 mg by mouth 2 (two) times daily.    Yes Historical Provider, MD  nitroGLYCERIN (NITROSTAT) 0.4 MG SL tablet Place 1 tablet (0.4 mg total) under the tongue every 5 (five) minutes x 3 doses as needed for chest pain. 05/25/13  Yes Mihai Croitoru, MD  Nutritional Supplements (VITAMIN D  MAINTENANCE PO) Take 5,000 Units by mouth daily.   Yes Historical Provider, MD  pantoprazole (PROTONIX) 40 MG tablet TAKE 1 TABLET BY MOUTH TWICE A DAY 08/19/13  Yes Mihai Croitoru, MD  pravastatin (PRAVACHOL) 80 MG tablet Take 40 mg by mouth daily.   Yes Historical Provider, MD  torsemide (DEMADEX) 20 MG tablet Take 20 mg by mouth every other day.    Yes Historical Provider, MD  zolpidem (AMBIEN) 10 MG tablet Take 10 mg by mouth at bedtime as needed for sleep.   Yes Historical Provider, MD   BP 125/68 mmHg  Pulse 70  Temp(Src) 98.3 F (36.8 C) (Oral)  Resp 19  Ht 5\' 11"  (1.803 m)  Wt 180 lb (81.647 kg)  BMI 25.12 kg/m2  SpO2 96% Physical Exam  Constitutional: He is oriented to person, place, and time. He appears well-developed and well-nourished.  HENT:  Head: Normocephalic and atraumatic.  Eyes: Conjunctivae and EOM are normal.  Neck: Normal range of motion. Neck supple.  Cardiovascular: Normal rate, regular rhythm and normal heart sounds.   Pulmonary/Chest: Effort normal and breath sounds normal. No respiratory distress.  Abdominal: He exhibits no distension. There is no tenderness. There is no rebound and no guarding.  Musculoskeletal: Normal range of motion.  Neurological: He is alert and oriented to person, place, and time.  Skin: Skin is warm and dry.  Vitals reviewed.   ED Course  Procedures (including critical care time) Labs Review Labs Reviewed  CBC - Abnormal; Notable for the following:    RBC 4.20 (*)    All other components within normal limits  BASIC METABOLIC PANEL - Abnormal; Notable for the following:    Glucose, Bld 131 (*)    GFR calc non Af Amer 77 (*)    GFR calc Af Amer 89 (*)    All other components within normal limits  BRAIN NATRIURETIC PEPTIDE  TROPONIN I  HEPATIC FUNCTION PANEL  LIPASE, BLOOD  I-STAT TROPOININ, ED  I-STAT TROPOININ, ED    Imaging Review Dg Chest 2 View  01/29/2014   CLINICAL DATA:  Chest tightness and shortness of breath   EXAM: CHEST  2 VIEW  COMPARISON:  12/15/2012  FINDINGS: Cardiac shadow is mildly enlarged. Postsurgical changes are again noted. A pacing device is again seen and stable. A large hiatal hernia is noted with an air-fluid level within. The lungs are clear. Degenerative changes of the thoracic spine are noted.  IMPRESSION: Stable large hiatal hernia.  No acute abnormality is noted.   Electronically Signed   By: Inez Catalina M.D.   On: 01/29/2014 20:02     EKG Interpretation   Date/Time:  Saturday January 29 2014 19:45:48 EST Ventricular  Rate:  70 PR Interval:  204 QRS Duration: 184 QT Interval:  461 QTC Calculation: 497 R Axis:   -48 Text Interpretation:  atrial-paced complexes Confirmed by Debby Freiberg  [phone]) on 01/29/2014 8:57:03 PM      MDM   Final diagnoses:  Chest pain    80 y.o. male with pertinent PMH of CAD sp CABG with pacer presents with chest pain as described above. Symptoms concerning for unstable angina. No pain during my exam.  EKG unchanged from prior. Exam unremarkable. Initial workup unremarkable. Consulted hospitalist for admission..    I have reviewed all laboratory and imaging studies if ordered as above  1. Chest pain         Debby Freiberg, MD 01/29/14 [phone]

## 2014-01-29 NOTE — H&P (Signed)
[facility]                                                                                    Patient Demographics  Adam [name], is a 80 y.o. male  CSN: [id number]  MRN: [id number]  DOB - 02-14-1926  Admit Date - 01/29/2014  Outpatient Primary MD for the patient is Tommy Medal, MD   With History of -  Past Medical History  Diagnosis Date  . CAD (coronary artery disease)     CABG (LIMA-LAD, SVG-RCA, SVG-OM in 1996).  07/2009 BMS to SVG-RCA. Cath in 04/2010 with patent stents   . Bradycardia     AFib/SSS s/p St Jude PPM 04/12/2008  . Hypothyroid   . Pacemaker   . Hyperlipidemia   . GERD (gastroesophageal reflux disease)   . Hiatal hernia   . Insomnia   . S/P CABG x 4   . RBBB   . Melanoma of back 1976  . Heart murmur     "just told I had one today" (12/15/2012)  . Myocardial infarction 1996; 2011    "both silent" (12/15/2012)  . Exertional shortness of breath     "sometimes walking" (12/15/2012)  . Arthritis     "minor, back and sometimes knees" (12/15/2012)      Past Surgical History  Procedure Laterality Date  . US echocardiography  07/11/2009    EF 45-50%  . Nm myoview ltd  06/2011    low risk  . Coronary artery bypass graft  1996    LIMA to LAD,SVG to RCA & SVG to OM  . Tonsillectomy  1938  . Appendectomy  1942  . Coronary angioplasty with stent placement  07/2009    bare metal stent to SVG to the RCA  . Cardiac catheterization  04/2010    LIMA to LAD patent,SVG to OM patent,no in-stnet restenosis RCA  . Cataract extraction w/ intraocular lens  implant, bilateral Bilateral 2012  . Melanoma excision  05/1974 X2    "taken off my back" (12/15/2012)  . Transurethral resection of prostate  1986  . Insert / replace / remove pacemaker  2010    in for   Chief Complaint  Patient presents with  . Chest Pain     HPI  Adam [name]  is a 80 y.o. male, with past medical history significant for coronary artery disease, status post CABG and atrial  fibrillation/sick sinus syndrome status post pacemaker presenting with epigastric/retrosternal chest discomfort for a few days duration getting worse today. Patient complained of dizziness for the last 1 week on and off. No associated nausea shortness of breath or cold sweats. Patient reports 2 silent MIs in the past.    Review of Systems    In addition to the HPI above,  No Fever-chills, No Headache, No changes with Vision or hearing, No problems swallowing food or Liquids, No Blood in stool or Urine, No dysuria, No new skin rashes or bruises, No new joints pains-aches,  No new weakness, tingling, numbness in any extremity, No recent weight gain or loss, No polyuria, polydypsia or polyphagia, No significant Mental Stressors.  A full 10 point Review of Systems was done,  except as stated above, all other Review of Systems were negative.   Social History History  Substance Use Topics  . Smoking status: Former Smoker -- 2 years    Types: Cigarettes  . Smokeless tobacco: Never Used     Comment: 12/15/2012 Hasn't had a cigarette since he was 80 years old and even then it was occasional  . Alcohol Use: No     Family History Family History  Problem Relation Age of Onset  . Coronary artery disease Mother   . Coronary artery disease Father     Prior to Admission medications   Medication Sig Start Date End Date Taking? Authorizing Provider  aspirin EC 81 MG EC tablet Take 1 tablet (81 mg total) by mouth daily. 12/16/12  Yes Isaiah Serge, NP  clobetasol cream (TEMOVATE) 8.29 % Apply 1 application topically daily as needed (for irritation).    Yes Historical Provider, MD  clopidogrel (PLAVIX) 75 MG tablet Take 75 mg by mouth daily.   Yes Historical Provider, MD  levothyroxine (SYNTHROID, LEVOTHROID) 100 MCG tablet Take 100 mcg by mouth daily.   Yes Historical Provider, MD  metoprolol tartrate (LOPRESSOR) 25 MG tablet Take 12.5 mg by mouth 2 (two) times daily.    Yes Historical  Provider, MD  nitroGLYCERIN (NITROSTAT) 0.4 MG SL tablet Place 1 tablet (0.4 mg total) under the tongue every 5 (five) minutes x 3 doses as needed for chest pain. 05/25/13  Yes Mihai Croitoru, MD  Nutritional Supplements (VITAMIN D MAINTENANCE PO) Take 5,000 Units by mouth daily.   Yes Historical Provider, MD  pantoprazole (PROTONIX) 40 MG tablet TAKE 1 TABLET BY MOUTH TWICE A DAY 08/19/13  Yes Mihai Croitoru, MD  pravastatin (PRAVACHOL) 80 MG tablet Take 40 mg by mouth daily.   Yes Historical Provider, MD  torsemide (DEMADEX) 20 MG tablet Take 20 mg by mouth every other day.    Yes Historical Provider, MD  zolpidem (AMBIEN) 10 MG tablet Take 10 mg by mouth at bedtime as needed for sleep.   Yes Historical Provider, MD    Allergies  Allergen Reactions  . Altace [Ramipril]     Cough  . Penicillins Rash  . Sulfa Antibiotics Rash    Physical Exam  Vitals  Blood pressure 125/68, pulse 70, temperature 98.3 F (36.8 C), temperature source Oral, resp. rate 19, height 5\' 11"  (1.803 m), weight 81.647 kg (180 lb), SpO2 96 %.   1. General Extremely pleasant gentleman in NAD  2. Normal affect and insight, Not Suicidal or Homicidal, Awake Alert, Oriented X 3.  3. No F.N deficits, ALL C.Nerves Intact, Strength 5/5 all 4 extremities, Sensation intact all 4 extremities, Plantars down going.  4. Ears and Eyes appear Normal, Conjunctivae clear,  Moist Oral Mucosa.  5. Supple Neck, No JVD, No cervical lymphadenopathy appriciated, No Carotid Bruits.  6. Symmetrical Chest wall movement, Good air movement bilaterally, CTAB.  7. RRR, No Gallops, SEM, No Parasternal Heave.  8. Positive Bowel Sounds, Abdomen tympanic,No rebound -guarding or rigidity.  9.  No Cyanosis, Normal Skin Turgor, No Skin Rash or Bruise.  10. Good muscle tone,  joints appear normal , no effusions, Normal ROM.  11. No Palpable Lymph Nodes in Neck or Axillae   Data Review  CBC  Recent Labs Lab 01/29/14 2056  WBC 6.6   HGB 13.5  HCT 41.7  PLT 244  MCV 99.3  MCH 32.1  MCHC 32.4  RDW 14.5   ------------------------------------------------------------------------------------------------------------------  Chemistries   Recent  Labs Lab 01/29/14 2056  NA 138  K 4.3  CL 105  CO2 28  GLUCOSE 131*  BUN 18  CREATININE 0.87  CALCIUM 9.0  AST 32  ALT 17  ALKPHOS 58  BILITOT 0.7   ------------------------------------------------------------------------------------------------------------------ estimated creatinine clearance is 66.1 mL/min (by C-G formula based on Cr of 0.87). ------------------------------------------------------------------------------------------------------------------ No results for input(s): TSH, T4TOTAL, T3FREE, THYROIDAB in the last 72 hours.  Invalid input(s): FREET3   Coagulation profile No results for input(s): INR, PROTIME in the last 168 hours. ------------------------------------------------------------------------------------------------------------------- No results for input(s): DDIMER in the last 72 hours. -------------------------------------------------------------------------------------------------------------------  Cardiac Enzymes  Recent Labs Lab 01/29/14 2057  TROPONINI <0.03   ------------------------------------------------------------------------------------------------------------------ Invalid input(s): POCBNP   ---------------------------------------------------------------------------------------------------------------  Urinalysis No results found for: COLORURINE, APPEARANCEUR, LABSPEC, PHURINE, GLUCOSEU, HGBUR, BILIRUBINUR, KETONESUR, PROTEINUR, UROBILINOGEN, NITRITE, LEUKOCYTESUR  ----------------------------------------------------------------------------------------------------------------     Imaging results:   Dg Chest 2 View  01/29/2014   CLINICAL DATA:  Chest tightness and shortness of breath  EXAM: CHEST  2 VIEW   COMPARISON:  12/15/2012  FINDINGS: Cardiac shadow is mildly enlarged. Postsurgical changes are again noted. A pacing device is again seen and stable. A large hiatal hernia is noted with an air-fluid level within. The lungs are clear. Degenerative changes of the thoracic spine are noted.  IMPRESSION: Stable large hiatal hernia.  No acute abnormality is noted.   Electronically Signed   By: Inez Catalina M.D.   On: 01/29/2014 20:02    My personal review of EKG: Atrial paced rhythm at 70 B/M    Assessment & Plan  1. Chest pain , more epigastric 2. History of coronary artery disease and 2 silent MIs with history of CABG in 1996 3. History of hiatal hernia on Protonix 4. Sick sinus syndrome status post pacemaker 5. Abdominal distention; check abdominal x-ray 6. History of hypothyroidism and hyperlipidemia  Plan  Serial Troponins Check abdominal x-ray Continue aspirin and beta blocker and statin  DVT Prophylaxis lovenox  AM Labs Ordered, also please review Full Orders  Family Communication: Admission, patients condition and plan of care including tests being ordered have been discussed with the patient and wife who indicate understanding and agree with the plan and Code Status.  Code Status full  Disposition Plan: Home  Time spent in minutes : 32 minutes  Condition GUARDED   @SIGNATURE @

## 2014-01-29 NOTE — ED Notes (Signed)
Pt reports that he has been having chest tightness with ShOB upon exertion, dizziness, diaphoresis, back pain and weakness. Pt reports hx of quadruple bypass in 1996. Pt a&ox4, skin warm and dry, able to ambulate with cane to triage.

## 2014-01-30 ENCOUNTER — Other Ambulatory Visit: Payer: Self-pay

## 2014-01-30 ENCOUNTER — Inpatient Hospital Stay (HOSPITAL_COMMUNITY): Payer: Medicare Other

## 2014-01-30 DIAGNOSIS — I2510 Atherosclerotic heart disease of native coronary artery without angina pectoris: Secondary | ICD-10-CM | POA: Diagnosis not present

## 2014-01-30 DIAGNOSIS — R079 Chest pain, unspecified: Secondary | ICD-10-CM | POA: Diagnosis not present

## 2014-01-30 DIAGNOSIS — R072 Precordial pain: Secondary | ICD-10-CM

## 2014-01-30 DIAGNOSIS — E7800 Pure hypercholesterolemia, unspecified: Secondary | ICD-10-CM

## 2014-01-30 DIAGNOSIS — E785 Hyperlipidemia, unspecified: Secondary | ICD-10-CM

## 2014-01-30 DIAGNOSIS — I255 Ischemic cardiomyopathy: Secondary | ICD-10-CM

## 2014-01-30 DIAGNOSIS — E039 Hypothyroidism, unspecified: Secondary | ICD-10-CM | POA: Diagnosis not present

## 2014-01-30 DIAGNOSIS — R42 Dizziness and giddiness: Secondary | ICD-10-CM | POA: Diagnosis not present

## 2014-01-30 LAB — TROPONIN I
Troponin I: <0.03 ng/mL (ref <0.031)
Troponin I: <0.03 ng/mL (ref <0.031)

## 2014-01-30 NOTE — Progress Notes (Signed)
PROGRESS NOTE  Adam [name] QVZ:[id number] DOB: 07-Nov-1926 DOA: 01/29/2014 PCP: Tommy Medal, MD   Brief history 80 year old male with a history of coronary artery disease status post CABG, sick sinus syndrome status post permanent pacemaker, hyperlipidemia, and hypothyroidism presents with 3 day history of substernal chest discomfort. The patient states that he is also been feeling some dizziness when he gets up and starts walking. He denies any falls, syncope. There is been no changes in his medications. The patient went to [facility] urgent care where he was told to go to ED for further evaluation.  Troponins have been negative. Chest x-ray showed large hiatus hernia. Nuclear medicine stress test 12/22/2012 was negative with EF 41%. Assessment/Plan: Atypical chest pain/Ischemic Cardiomyopathy -Given the patient's history of MI and associated dizziness, I have consulted cardiology -Troponins negative x 2 -EKG unchanged -Continue aspirin and Plavix  sick sinus syndrome  -Stable  -s/p PPM Dizziness -check orthostatics ?Abdominal distension -no distension on exam today -tolerating diet Hyperlipidemia  -Continue statin  Hypothyroidism  -continue Synthroid  Leg Edema -[place] legs r/o DVT    Family Communication:   Wife updated at beside Disposition Plan:   Home when medically stable       Procedures/Studies: Dg Chest 2 View  01/29/2014   CLINICAL DATA:  Chest tightness and shortness of breath  EXAM: CHEST  2 VIEW  COMPARISON:  12/15/2012  FINDINGS: Cardiac shadow is mildly enlarged. Postsurgical changes are again noted. A pacing device is again seen and stable. A large hiatal hernia is noted with an air-fluid level within. The lungs are clear. Degenerative changes of the thoracic spine are noted.  IMPRESSION: Stable large hiatal hernia.  No acute abnormality is noted.   Electronically Signed   By: Inez Catalina M.D.   On: 01/29/2014 20:02         Subjective: Patient  denies fevers, chills, headache, chest pain, dyspnea, nausea, vomiting, diarrhea, abdominal pain, dysuria, hematuria   Objective: Filed Vitals:   01/29/14 2315 01/29/14 2342 01/30/14 0110 01/30/14 0548  BP:  160/78  91/78  Pulse: 70 78 70 71  Temp:  98.3 F (36.8 C)  98.1 F (36.7 C)  TempSrc:  Oral  Oral  Resp: 13 16  16   Height:  5\' 11"  (1.803 m)    Weight:  84.913 kg (187 lb 3.2 oz)    SpO2: 100% 98%  95%    Intake/Output Summary (Last 24 hours) at 01/30/14 3295 Last data filed at 01/30/14 0545  Gross per 24 hour  Intake    250 ml  Output    100 ml  Net    150 ml   Weight change:  Exam:   General:  Pt is alert, follows commands appropriately, not in acute distress  HEENT: No icterus, No thrush,  [place]/AT  Cardiovascular: RRR, S1/S2, no rubs, no gallops  Respiratory: CTA bilaterally, no wheezing, no crackles, no rhonchi  Abdomen: Soft/+BS, non tender, non distended, no guarding  Extremities: 1+LE edema, No lymphangitis, No petechiae, No rashes, no synovitis  Data Reviewed: Basic Metabolic Panel:  Recent Labs Lab 01/29/14 2056  NA 138  K 4.3  CL 105  CO2 28  GLUCOSE 131*  BUN 18  CREATININE 0.87  CALCIUM 9.0   Liver Function Tests:  Recent Labs Lab 01/29/14 2056  AST 32  ALT 17  ALKPHOS 58  BILITOT 0.7  PROT 7.0  ALBUMIN 3.7    Recent Labs Lab  01/29/14 2056  LIPASE 26   No results for input(s): AMMONIA in the last 168 hours. CBC:  Recent Labs Lab 01/29/14 2056  WBC 6.6  HGB 13.5  HCT 41.7  MCV 99.3  PLT 244   Cardiac Enzymes:  Recent Labs Lab 01/29/14 2057 01/30/14 0300  TROPONINI <0.03 <0.03   BNP: Invalid input(s): POCBNP CBG: No results for input(s): GLUCAP in the last 168 hours.  No results found for this or any previous visit (from the past 240 hour(s)).   Scheduled Meds: . aspirin EC  81 mg Oral Daily  . clopidogrel  75 mg Oral Daily  . enoxaparin (LOVENOX) injection  40 mg Subcutaneous QHS  . levothyroxine   100 mcg Oral QAC breakfast  . metoprolol tartrate  12.5 mg Oral BID  . pantoprazole  40 mg Oral BID  . pravastatin  40 mg Oral Daily  . sodium chloride  3 mL Intravenous Q12H  . sodium chloride  3 mL Intravenous Q12H  . torsemide  20 mg Oral QODAY   Continuous Infusions:    Icess Bertoni, DO  [facility] Pager [phone]  If 7PM-7AM, please contact night-coverage [email or web address] Password [facility] 01/30/2014, 9:37 AM   LOS: 1 day

## 2014-01-30 NOTE — Consult Note (Signed)
Reason for Consult:chest pain  Referring Physician: Dr. Carollee Massed Adam [name] is an 80 y.o. male.   HPI: The patient is an 80 yo man with CAD, s/p MI, s/p CABG, s/p PCI/Stenting in 2011. He was in his usual state of health until a week or so ago when he began feeling dizzy with more sob. His symptoms are similar to those of prior heart problems. He has not had syncope. No edema. He has been sob with exertion for years. Now he is worse. Pressure in chest is made worse with exertion.   PMH: Past Medical History  Diagnosis Date  . CAD (coronary artery disease)     CABG (LIMA-LAD, SVG-RCA, SVG-OM in 1996).  07/2009 BMS to SVG-RCA. Cath in 04/2010 with patent stents   . Bradycardia     AFib/SSS s/p St Jude PPM 04/12/2008  . Hypothyroid   . Pacemaker   . Hyperlipidemia   . GERD (gastroesophageal reflux disease)   . Hiatal hernia   . Insomnia   . S/P CABG x 4   . RBBB   . Melanoma of back 1976  . Heart murmur     "just told I had one today" (12/15/2012)  . Myocardial infarction 1996; 2011    "both silent" (12/15/2012)  . Exertional shortness of breath     "sometimes walking" (12/15/2012)  . Arthritis     "minor, back and sometimes knees" (12/15/2012)    PSHX: Past Surgical History  Procedure Laterality Date  . US echocardiography  07/11/2009    EF 45-50%  . Nm myoview ltd  06/2011    low risk  . Coronary artery bypass graft  1996    LIMA to LAD,SVG to RCA & SVG to OM  . Tonsillectomy  1938  . Appendectomy  1942  . Coronary angioplasty with stent placement  07/2009    bare metal stent to SVG to the RCA  . Cardiac catheterization  04/2010    LIMA to LAD patent,SVG to OM patent,no in-stnet restenosis RCA  . Cataract extraction w/ intraocular lens  implant, bilateral Bilateral 2012  . Melanoma excision  05/1974 X2    "taken off my back" (12/15/2012)  . Transurethral resection of prostate  1986  . Insert / replace / remove pacemaker  2010    FAMHX: Family History  Problem  Relation Age of Onset  . Coronary artery disease Mother   . Coronary artery disease Father     Social History:  reports that he has quit smoking. His smoking use included Cigarettes. He smoked 0.00 packs per day for 2 years. He has never used smokeless tobacco. He reports that he does not drink alcohol or use illicit drugs.  Allergies:  Allergies  Allergen Reactions  . Altace [Ramipril]     Cough  . Penicillins Rash  . Sulfa Antibiotics Rash    Medications: reviewed  Dg Chest 2 View  01/29/2014   CLINICAL DATA:  Chest tightness and shortness of breath  EXAM: CHEST  2 VIEW  COMPARISON:  12/15/2012  FINDINGS: Cardiac shadow is mildly enlarged. Postsurgical changes are again noted. A pacing device is again seen and stable. A large hiatal hernia is noted with an air-fluid level within. The lungs are clear. Degenerative changes of the thoracic spine are noted.  IMPRESSION: Stable large hiatal hernia.  No acute abnormality is noted.   Electronically Signed   By: Inez Catalina M.D.   On: 01/29/2014 20:02   Dg Abd 1 View  01/30/2014   CLINICAL DATA:  Abdominal distension  EXAM: ABDOMEN - 1 VIEW  COMPARISON:  CT scan 11/02/2003  FINDINGS: There is nonspecific nonobstructive bowel gas pattern. Some stool noted in right colon. Colonic gas noted in sigmoid colon without significant colonic distention.  IMPRESSION: Nonspecific nonobstructive bowel gas pattern. Some stool noted in right colon. Colonic gas in sigmoid colon without significant colonic distention per   Electronically Signed   By: Lahoma Crocker M.D.   On: 01/30/2014 11:06    ROS  As stated in the HPI and negative for all other systems.  Physical Exam  Vitals:Blood pressure 98/59, pulse 69, temperature 97.9 F (36.6 C), temperature source Oral, resp. rate 16, height 5\' 11"  (1.803 m), weight 187 lb 3.2 oz (84.913 kg), SpO2 96 %.  Well appearing elderly man NAD HEENT: Unremarkable Neck:  7 cm JVD, no thyromegally Lymphatics:  No  adenopathy Back:  No CVA tenderness Lungs:  Clear with no wheezes HEART:  Regular rate rhythm, no murmurs, no rubs, no clicks Abd:  soft, positive bowel sounds, no organomegally, no rebound, no guarding Ext:  2 plus pulses, no edema, no cyanosis, no clubbing Skin:  No rashes no nodules Neuro:  CN II through XII intact, motor grossly intact  ECG - nsr with atrial pacing and RBBB   Assessment/Plan: 1. Chest pressure 2. Symptomatic sinus node dysfunction 3. S/p PPM 4. Known CAD, s/p CABG Rec: he both typical and atypical features for [place]. I have recommended stress testing prior to discharge. Will follow.   Carleene Overlie TaylorMD 01/30/2014, 4:29 PM

## 2014-01-31 ENCOUNTER — Inpatient Hospital Stay (HOSPITAL_COMMUNITY)
Admission: EM | Admit: 2014-01-31 | Discharge: 2014-01-31 | Disposition: A | Discharge status: Home / Self Care | Payer: Medicare Other | Source: Home / Self Care | Attending: Internal Medicine | Admitting: Internal Medicine

## 2014-01-31 ENCOUNTER — Ambulatory Visit (HOSPITAL_COMMUNITY)
Admit: 2014-01-31 | Discharge: 2014-01-31 | Disposition: A | Discharge status: Home / Self Care | Payer: Medicare Other | Source: Ambulatory Visit | Attending: Internal Medicine | Admitting: Internal Medicine

## 2014-01-31 ENCOUNTER — Other Ambulatory Visit: Payer: Self-pay

## 2014-01-31 VITALS — BP 126/78 | HR 71

## 2014-01-31 DIAGNOSIS — I495 Sick sinus syndrome: Secondary | ICD-10-CM

## 2014-01-31 DIAGNOSIS — E039 Hypothyroidism, unspecified: Secondary | ICD-10-CM | POA: Diagnosis not present

## 2014-01-31 DIAGNOSIS — E876 Hypokalemia: Secondary | ICD-10-CM

## 2014-01-31 DIAGNOSIS — R079 Chest pain, unspecified: Secondary | ICD-10-CM

## 2014-01-31 DIAGNOSIS — R0789 Other chest pain: Secondary | ICD-10-CM

## 2014-01-31 DIAGNOSIS — R42 Dizziness and giddiness: Secondary | ICD-10-CM | POA: Diagnosis not present

## 2014-01-31 DIAGNOSIS — I2584 Coronary atherosclerosis due to calcified coronary lesion: Secondary | ICD-10-CM

## 2014-01-31 DIAGNOSIS — I252 Old myocardial infarction: Secondary | ICD-10-CM | POA: Insufficient documentation

## 2014-01-31 DIAGNOSIS — I2510 Atherosclerotic heart disease of native coronary artery without angina pectoris: Secondary | ICD-10-CM | POA: Diagnosis not present

## 2014-01-31 HISTORY — DX: Sick sinus syndrome: I49.5

## 2014-01-31 LAB — BASIC METABOLIC PANEL
Anion gap: 3 — ABNORMAL LOW (ref 5–15)
BUN: 17 mg/dL (ref 6–23)
CO2: 31 mmol/L (ref 19–32)
Calcium: 8.4 mg/dL (ref 8.4–10.5)
Chloride: 104 mEq/L (ref 96–112)
Creatinine, Ser: 0.89 mg/dL (ref 0.50–1.35)
GFR calc Af Amer: 88 mL/min — ABNORMAL LOW (ref >90)
GFR calc non Af Amer: 76 mL/min — ABNORMAL LOW (ref >90)
Glucose, Bld: 98 mg/dL (ref 70–99)
Potassium: 3.4 mmol/L — ABNORMAL LOW (ref 3.5–5.1)
Sodium: 138 mmol/L (ref 135–145)

## 2014-01-31 MED ORDER — TECHNETIUM TC 99M SESTAMIBI GENERIC - CARDIOLITE
30.0000 | Freq: Once | INTRAVENOUS | Status: AC | PRN
  Administered 2014-01-31: 30 via INTRAVENOUS

## 2014-01-31 MED ORDER — POTASSIUM CHLORIDE CRYS ER 20 MEQ PO TBCR
20.0000 meq | EXTENDED_RELEASE_TABLET | Freq: Once | ORAL | Status: AC
  Administered 2014-01-31: 20 meq via ORAL
  Filled 2014-01-31: qty 1

## 2014-01-31 MED ORDER — TECHNETIUM TC 99M SESTAMIBI GENERIC - CARDIOLITE
10.0000 | Freq: Once | INTRAVENOUS | Status: AC | PRN
  Administered 2014-01-31: 10 via INTRAVENOUS

## 2014-01-31 MED ORDER — REGADENOSON 0.4 MG/5ML IV SOLN: 0.4000 mg | Freq: Once | INTRAVENOUS | Status: DC

## 2014-01-31 MED ORDER — REGADENOSON 0.4 MG/5ML IV SOLN
INTRAVENOUS | Status: AC
  Administered 2014-01-31: 0.4 mg
  Filled 2014-01-31: qty 5

## 2014-01-31 NOTE — Progress Notes (Signed)
PROGRESS NOTE  Adam [name] HRC:[id number] DOB: [birth year] DOA: 01/29/2014 PCP: Tommy Medal, MD  Brief history 80 year old male with a history of coronary artery disease status post CABG, sick sinus syndrome status post permanent pacemaker, hyperlipidemia, and hypothyroidism presents with 3 day history of substernal chest discomfort. The patient states that he is also been feeling some dizziness when he gets up and starts walking. He denies any falls, syncope. There is been no changes in his medications. The patient went to [facility] urgent care where he was told to go to ED for further evaluation. Troponins have been negative. Chest x-ray showed large hiatus hernia. Nuclear medicine stress test 12/22/2012 was negative with EF 41%. Assessment/Plan: Atypical chest pain/Ischemic Cardiomyopathy -Given the patient's history of MI and associated dizziness, I have consulted cardiology -Troponins negative x 2 -EKG unchanged -Continue aspirin and Plavix  -appreciate cardiology followup -01/31/14 Lexiscan-->no reversible ischemia but intermediate to high risk scan noted -as pt had CABG 40yrs ago, will keep in house for now for cardiology opinion about cath sick sinus syndrome  -Stable  -s/p PPM Dizziness resolved ?Abdominal distension -no distension on exam today -tolerating diet Hyperlipidemia  -Continue statin  Hypothyroidism  -continue Synthroid  Leg Edema -venous duplex r/o DVT Hypokalemia -Replete -Check magnesium  Family Communication: Wife updated at beside Dispo--pending cardiology input on 12/29    Procedures/Studies: Dg Chest 2 View  01/29/2014   CLINICAL DATA:  Chest tightness and shortness of breath  EXAM: CHEST  2 VIEW  COMPARISON:  12/15/2012  FINDINGS: Cardiac shadow is mildly enlarged. Postsurgical changes are again noted. A pacing device is again seen and stable. A large hiatal hernia is noted with an air-fluid level within. The lungs are clear.  Degenerative changes of the thoracic spine are noted.  IMPRESSION: Stable large hiatal hernia.  No acute abnormality is noted.   Electronically Signed   By: Inez Catalina M.D.   On: 01/29/2014 20:02   Dg Abd 1 View  01/30/2014   CLINICAL DATA:  Abdominal distension  EXAM: ABDOMEN - 1 VIEW  COMPARISON:  CT scan 11/02/2003  FINDINGS: There is nonspecific nonobstructive bowel gas pattern. Some stool noted in right colon. Colonic gas noted in sigmoid colon without significant colonic distention.  IMPRESSION: Nonspecific nonobstructive bowel gas pattern. Some stool noted in right colon. Colonic gas in sigmoid colon without significant colonic distention per   Electronically Signed   By: Lahoma Crocker M.D.   On: 01/30/2014 11:06   Nm Myocar Multi W/spect W/wall Motion / Ef  01/31/2014   CLINICAL DATA:  History of MI. Chest pain. Prior CABG. Coronary artery disease.  EXAM: MYOCARDIAL IMAGING WITH SPECT (REST AND PHARMACOLOGIC-STRESS)  GATED LEFT VENTRICULAR WALL MOTION STUDY  LEFT VENTRICULAR EJECTION FRACTION  TECHNIQUE: Standard myocardial SPECT imaging was performed after resting intravenous injection of 10 mCi Tc-65m sestamibi. Subsequently, intravenous infusion of Lexiscan was performed under the supervision of the Cardiology staff. At peak effect of the drug, 30 mCi Tc-16m sestamibi was injected intravenously and standard myocardial SPECT imaging was performed. Quantitative gated imaging was also performed to evaluate left ventricular wall motion, and estimate left ventricular ejection fraction.  COMPARISON:  None.  FINDINGS: Perfusion: There is a large fixed defect involving the inferior wall and inferior lateral wall. No abnormal areas of reversibility identified.  Wall Motion: Mild diffuse hypokinesis is identified.  Left Ventricular Ejection Fraction: 45 %  End diastolic volume 680 ml  End systolic volume  69 ml  IMPRESSION: 1. No reversible ischemia. Large infarct involves the inferior wall and inferior  lateral wall.  2. Mildly impaired left ventricular systolic function.  3. Left ventricular ejection fraction 45%  4. Intermediate to high-risk stress test findings*.  *2012 Appropriate Use Criteria for Coronary Revascularization Focused Update: J Am Coll Cardiol. 9622;29(7):989-211. http://content.[email or web address].aspx?articleid=1201161   Electronically Signed   By: Kerby Moors M.D.   On: 01/31/2014 16:17         Subjective: Patient denies fevers, chills, headache, chest pain, dyspnea, nausea, vomiting, diarrhea, abdominal pain, dysuria, hematuria   Objective: Filed Vitals:   01/30/14 1459 01/30/14 2034 01/31/14 0432 01/31/14 1534  BP: 98/59 119/59 126/73 136/96  Pulse: 69 75 71 72  Temp: 97.9 F (36.6 C) 98.6 F (37 C) 98 F (36.7 C) 97.5 F (36.4 C)  TempSrc: Oral Oral Oral Oral  Resp: 16 16 16 18   Height:      Weight:      SpO2: 96% 96% 97% 98%    Intake/Output Summary (Last 24 hours) at 01/31/14 1825 Last data filed at 01/31/14 1700  Gross per 24 hour  Intake    240 ml  Output      0 ml  Net    240 ml   Weight change:  Exam:   General:  Pt is alert, follows commands appropriately, not in acute distress  HEENT: No icterus, No thrush,  [place]/AT  Cardiovascular: RRR, S1/S2, no rubs, no gallops  Respiratory: CTA bilaterally, no wheezing, no crackles, no rhonchi  Abdomen: Soft/+BS, non tender, non distended, no guarding  Extremities: 1+LE edema, No lymphangitis, No petechiae, No rashes, no synovitis  Data Reviewed: Basic Metabolic Panel:  Recent Labs Lab 01/29/14 2056 01/31/14 0420  NA 138 138  K 4.3 3.4*  CL 105 104  CO2 28 31  GLUCOSE 131* 98  BUN 18 17  CREATININE 0.87 0.89  CALCIUM 9.0 8.4   Liver Function Tests:  Recent Labs Lab 01/29/14 2056  AST 32  ALT 17  ALKPHOS 58  BILITOT 0.7  PROT 7.0  ALBUMIN 3.7    Recent Labs Lab 01/29/14 2056  LIPASE 26   No results for input(s): AMMONIA in the last 168 hours. CBC:  Recent  Labs Lab 01/29/14 2056  WBC 6.6  HGB 13.5  HCT 41.7  MCV 99.3  PLT 244   Cardiac Enzymes:  Recent Labs Lab 01/29/14 2057 01/30/14 0300 01/30/14 0917  TROPONINI <0.03 <0.03 <0.03   BNP: Invalid input(s): POCBNP CBG: No results for input(s): GLUCAP in the last 168 hours.  No results found for this or any previous visit (from the past 240 hour(s)).   Scheduled Meds: . aspirin EC  81 mg Oral Daily  . clopidogrel  75 mg Oral Daily  . enoxaparin (LOVENOX) injection  40 mg Subcutaneous QHS  . levothyroxine  100 mcg Oral QAC breakfast  . metoprolol tartrate  12.5 mg Oral BID  . pantoprazole  40 mg Oral BID  . potassium chloride  20 mEq Oral Once  . pravastatin  40 mg Oral Daily  . sodium chloride  3 mL Intravenous Q12H  . sodium chloride  3 mL Intravenous Q12H  . torsemide  20 mg Oral QODAY   Continuous Infusions:    Dahir Ayer, DO  [facility] Pager [phone]  If 7PM-7AM, please contact night-coverage [email or web address] Password [email or web address] 01/31/2014, 6:25 PM   LOS: 2 days

## 2014-01-31 NOTE — Progress Notes (Addendum)
Subjective:  Feels better; no chest pain but admits that he had significant chest pain in the past  Objective:   Vital Signs in the last 24 hours: Temp:  [97.9 F (36.6 C)-98.6 F (37 C)] 98 F (36.7 C) (12/28 0432) Pulse Rate:  [69-75] 71 (12/28 0432) Resp:  [16] 16 (12/28 0432) BP: (98-126)/(59-73) 126/73 mmHg (12/28 0432) SpO2:  [96 %-97 %] 97 % (12/28 0432)  Intake/Output from previous day: 12/27 0701 - 12/28 0700 In: 480 [P.O.:480] Out: -   Medications: . aspirin EC  81 mg Oral Daily  . clopidogrel  75 mg Oral Daily  . enoxaparin (LOVENOX) injection  40 mg Subcutaneous QHS  . levothyroxine  100 mcg Oral QAC breakfast  . metoprolol tartrate  12.5 mg Oral BID  . pantoprazole  40 mg Oral BID  . pravastatin  40 mg Oral Daily  . sodium chloride  3 mL Intravenous Q12H  . sodium chloride  3 mL Intravenous Q12H  . torsemide  20 mg Oral QODAY       Physical Exam:   General appearance: alert, cooperative and no distress Neck: no adenopathy, no carotid bruit, no JVD, supple, symmetrical, trachea midline and thyroid not enlarged, symmetric, no tenderness/mass/nodules Lungs: clear to auscultation bilaterally Heart: regular rate and rhythm and 2/6 systolic murmur Abdomen: soft, non-tender; bowel sounds normal; no masses,  no organomegaly Extremities: no edema, redness or tenderness in the calves or thighs Neurologic: Grossly normal   Rate: 78  Rhythm: normal sinus rhythm    ECG (independent review by me): AV paced at 71  Lab Results:   Recent Labs  01/29/14 2056 01/31/14 0420  NA 138 138  K 4.3 3.4*  CL 105 104  CO2 28 31  GLUCOSE 131* 98  BUN 18 17  CREATININE 0.87 0.89     Recent Labs  01/30/14 0300 01/30/14 0917  TROPONINI <0.03 <0.03    Hepatic Function Panel  Recent Labs  01/29/14 2056  PROT 7.0  ALBUMIN 3.7  AST 32  ALT 17  ALKPHOS 58  BILITOT 0.7  BILIDIR 0.2  IBILI 0.5   No results for input(s): INR in the last 72 hours. BNP  (last 3 results) No results for input(s): PROBNP in the last 8760 hours.  Lipid Panel     Component Value Date/Time   CHOL  09/19/2009 0354    93        ATP III CLASSIFICATION:  <200     mg/dL   Desirable  200-239  mg/dL   Borderline High  >=240    mg/dL   High          TRIG 98 09/19/2009 0354   HDL 34* 09/19/2009 0354   CHOLHDL 2.7 09/19/2009 0354   VLDL 20 09/19/2009 0354   LDLCALC  09/19/2009 0354    39        Total Cholesterol/HDL:CHD Risk Coronary Heart Disease Risk Table                     Men   Women  1/2 Average Risk   3.4   3.3  Average Risk       5.0   4.4  2 X Average Risk   9.6   7.1  3 X Average Risk  23.4   11.0        Use the calculated Patient Ratio above and the CHD Risk Table to determine the patient's CHD Risk.  ATP III CLASSIFICATION (LDL):  <100     mg/dL   Optimal  100-129  mg/dL   Near or Above                    Optimal  130-159  mg/dL   Borderline  160-189  mg/dL   High  >190     mg/dL   Very High      Imaging:  Dg Chest 2 View  01/29/2014   CLINICAL DATA:  Chest tightness and shortness of breath  EXAM: CHEST  2 VIEW  COMPARISON:  12/15/2012  FINDINGS: Cardiac shadow is mildly enlarged. Postsurgical changes are again noted. A pacing device is again seen and stable. A large hiatal hernia is noted with an air-fluid level within. The lungs are clear. Degenerative changes of the thoracic spine are noted.  IMPRESSION: Stable large hiatal hernia.  No acute abnormality is noted.   Electronically Signed   By: Inez Catalina M.D.   On: 01/29/2014 20:02   Dg Abd 1 View  01/30/2014   CLINICAL DATA:  Abdominal distension  EXAM: ABDOMEN - 1 VIEW  COMPARISON:  CT scan 11/02/2003  FINDINGS: There is nonspecific nonobstructive bowel gas pattern. Some stool noted in right colon. Colonic gas noted in sigmoid colon without significant colonic distention.  IMPRESSION: Nonspecific nonobstructive bowel gas pattern. Some stool noted in right colon. Colonic gas  in sigmoid colon without significant colonic distention per   Electronically Signed   By: Lahoma Crocker M.D.   On: 01/30/2014 11:06      Assessment/Plan:   Active Problems:   CAD (coronary artery disease)   Dizziness, after diuretic asscoiated with hypotension and responded to fluid bolus   Cardiomyopathy, ischemic   Chest pain   Hyperlipidemia  1. CAD: s/p CABG 01/16/1998; s/p BMS to SVG-RCA 07/2009. 2. S/P pacemaker: currently AV paced on today's ECG; h/ o sinus node dysfunction 3. Dizziness; improved today 4. Ischemic cardiomyopathy   Plan lexiscan myoview today to reassess scar/ischemia. Last nuc in 12/2012.  If significant change may need repaet cath 19 yrs post CABG.    Troy Sine, MD, [facility] 01/31/2014, 8:34 AM

## 2014-01-31 NOTE — Progress Notes (Signed)
CARE MANAGEMENT NOTE 01/31/2014  Patient:  Adam [name], Adam [name]   Account Number:  [id number]  Date Initiated:  01/31/2014  Documentation initiated by:  Karl Bales  Subjective/Objective Assessment:   pt admitted with cco Chest Pain     Action/Plan:   from home   Anticipated DC Date:  01/31/2014   Anticipated DC Plan:  HOME/SELF CARE         Choice offered to / List presented to:             Status of service:  In process, will continue to follow Medicare Important Message given?   (If response is "NO", the following Medicare IM given date fields will be blank) Date Medicare IM given:   Medicare IM given by:   Date Additional Medicare IM given:   Additional Medicare IM given by:    Discharge Disposition:    Per UR Regulation:  Reviewed for med. necessity/level of care/duration of stay  If discussed at [place] of Stay Meetings, dates discussed:    Comments:  01/31/14 MMcGibboney, RN, BSN Chart reviewed.

## 2014-01-31 NOTE — Progress Notes (Signed)
Lexiscan CL performed 

## 2014-02-01 ENCOUNTER — Other Ambulatory Visit (HOSPITAL_COMMUNITY): Payer: Medicare Other

## 2014-02-01 DIAGNOSIS — R609 Edema, unspecified: Secondary | ICD-10-CM

## 2014-02-01 DIAGNOSIS — I495 Sick sinus syndrome: Secondary | ICD-10-CM

## 2014-02-01 DIAGNOSIS — I209 Angina pectoris, unspecified: Secondary | ICD-10-CM

## 2014-02-01 LAB — BASIC METABOLIC PANEL
Anion gap: 4 — ABNORMAL LOW (ref 5–15)
BUN: 17 mg/dL (ref 6–23)
CO2: 28 mmol/L (ref 19–32)
Calcium: 8.6 mg/dL (ref 8.4–10.5)
Chloride: 107 mEq/L (ref 96–112)
Creatinine, Ser: 0.75 mg/dL (ref 0.50–1.35)
GFR calc Af Amer: >90 mL/min (ref >90)
GFR calc non Af Amer: 81 mL/min — ABNORMAL LOW (ref >90)
Glucose, Bld: 112 mg/dL — ABNORMAL HIGH (ref 70–99)
Potassium: 4.3 mmol/L (ref 3.5–5.1)
Sodium: 139 mmol/L (ref 135–145)

## 2014-02-01 LAB — MAGNESIUM: Magnesium: 2.4 mg/dL (ref 1.5–2.5)

## 2014-02-01 MED ORDER — ISOSORBIDE MONONITRATE 15 MG HALF TABLET
15.0000 mg | ORAL_TABLET | Freq: Every day | ORAL | Status: DC
  Administered 2014-02-01: 15 mg via ORAL
  Filled 2014-02-01: qty 1

## 2014-02-01 MED ORDER — METOPROLOL TARTRATE 25 MG PO TABS
25.0000 mg | ORAL_TABLET | Freq: Two times a day (BID) | ORAL | Status: DC
  Administered 2014-02-01: 25 mg via ORAL
  Filled 2014-02-01: qty 1

## 2014-02-01 MED ORDER — METOPROLOL TARTRATE 25 MG PO TABS: 25.0000 mg | ORAL_TABLET | Freq: Two times a day (BID) | ORAL | Status: DC

## 2014-02-01 NOTE — Discharge Summary (Addendum)
Physician Discharge Summary  KY RUMPLE TDV:[id number] DOB: Mar 31, 1926 DOA: 01/29/2014  PCP: Tommy Medal, MD  Admit date: 01/29/2014 Discharge date: 02/01/2014  Recommendations for Outpatient Follow-up:  1. Pt will need to follow up with PCP in 2 weeks post discharge 2. Please obtain BMP in one week  Discharge Diagnoses:  Atypical chest pain/Ischemic Cardiomyopathy -Given the patient's history of MI and associated dizziness, I have consulted cardiology -Troponins negative x 2 -EKG unchanged -Continue aspirin and Plavix  -appreciate cardiology followup -01/31/14 Lexiscan-->no reversible ischemia but intermediate to high risk scan noted; Old large in size and severe in intensity defect in the mid to upper inferior to lateral inferior wall with very mild peri-infarct ischemia -On the day of discharge, Dr. Claiborne Billings saw pt and cleared the patient for discharge--he did not feel that the patient needed a repeat heart catheterization, but the patient's metoprolol tartrate dose was increased to 25 mg twice a day -Dr. Claiborne Billings did not feel that there was any significant change from his prior Lexiscan sick sinus syndrome  -Stable  -s/p PPM Dizziness -intermitten associated with positional changes -likely due to meds -instructed pt to change positions slowly -may need further outpt evaluation if worsens -Patient remained hemodynamically stable without any dysrhythmias on telemetry ?Abdominal distension -no distension on exam  -tolerating diet Hyperlipidemia  -Continue statin  Hypothyroidism  -continue Synthroid  Leg Edema -venous duplex r/o DVT--neg Hypokalemia -Replete -Check magnesium--2.4  Family Communication: Wife updated at beside  Discharge Condition:stable Disposition: home  Diet:heart healthy Wt Readings from Last 3 Encounters:  01/29/14 84.913 kg (187 lb 3.2 oz)  10/12/13 82.6 kg (182 lb 1.6 oz)  02/23/13 83.825 kg (184 lb 12.8 oz)    History of present  illness:  80 year old male with a history of coronary artery disease status post CABG, sick sinus syndrome status post permanent pacemaker, hyperlipidemia, and hypothyroidism presents with 3 day history of substernal chest discomfort. The patient states that he is also been feeling some dizziness when he gets up and starts walking. He denies any falls, syncope. There is been no changes in his medications. The patient went to [facility] urgent care where he was told to go to ED for further evaluation. Troponins have been negative. Chest x-ray showed large hiatus hernia. Nuclear medicine stress test 12/22/2012 was negative with EF 41% troponins were negative during the hospitalization. The patient underwent a Lexiscan stress test. There was no reversible ischemia. Cardiology followed the patient. They did not feel that there was any significant change from his prior Lexiscan. He was cleared for discharge.Marland Kitchen    Discharge Exam: Filed Vitals:   02/01/14 0433  BP: 137/74  Pulse: 71  Temp: 97.5 F (36.4 C)  Resp: 20   Filed Vitals:   01/31/14 0432 01/31/14 1534 01/31/14 2048 02/01/14 0433  BP: 126/73 136/96 113/63 137/74  Pulse: 71 72 71 71  Temp: 98 F (36.7 C) 97.5 F (36.4 C) 98.2 F (36.8 C) 97.5 F (36.4 C)  TempSrc: Oral Oral Oral Oral  Resp: 16 18 20 20   Height:      Weight:      SpO2: 97% 98% 95% 95%   General: A&O x 3, NAD, pleasant, cooperative Cardiovascular: RRR, no rub, no gallop, no S3 Respiratory: CTAB, no wheeze, no rhonchi Abdomen:soft, nontender, nondistended, positive bowel sounds Extremities: trace LEedema, No lymphangitis, no petechiae  Discharge Instructions  Discharge Instructions    Diet - low sodium heart healthy    Complete by:  As directed  Increase activity slowly    Complete by:  As directed             Medication List    TAKE these medications        aspirin 81 MG EC tablet  Take 1 tablet (81 mg total) by mouth daily.     clobetasol cream  0.05 %  Commonly known as:  TEMOVATE  Apply 1 application topically daily as needed (for irritation).     clopidogrel 75 MG tablet  Commonly known as:  PLAVIX  Take 75 mg by mouth daily.     levothyroxine 100 MCG tablet  Commonly known as:  SYNTHROID, LEVOTHROID  Take 100 mcg by mouth daily.     metoprolol tartrate 25 MG tablet  Commonly known as:  LOPRESSOR  Take 1 tablet (25 mg total) by mouth 2 (two) times daily.     nitroGLYCERIN 0.4 MG SL tablet  Commonly known as:  NITROSTAT  Place 1 tablet (0.4 mg total) under the tongue every 5 (five) minutes x 3 doses as needed for chest pain.     pantoprazole 40 MG tablet  Commonly known as:  PROTONIX  TAKE 1 TABLET BY MOUTH TWICE A DAY     pravastatin 80 MG tablet  Commonly known as:  PRAVACHOL  Take 40 mg by mouth daily.     torsemide 20 MG tablet  Commonly known as:  DEMADEX  Take 20 mg by mouth every other day.     VITAMIN D MAINTENANCE PO  Take 5,000 Units by mouth daily.     zolpidem 10 MG tablet  Commonly known as:  AMBIEN  Take 10 mg by mouth at bedtime as needed for sleep.         The results of significant diagnostics from this hospitalization (including imaging, microbiology, ancillary and laboratory) are listed below for reference.    Significant Diagnostic Studies: Dg Chest 2 View  01/29/2014   CLINICAL DATA:  Chest tightness and shortness of breath  EXAM: CHEST  2 VIEW  COMPARISON:  12/15/2012  FINDINGS: Cardiac shadow is mildly enlarged. Postsurgical changes are again noted. A pacing device is again seen and stable. A large hiatal hernia is noted with an air-fluid level within. The lungs are clear. Degenerative changes of the thoracic spine are noted.  IMPRESSION: Stable large hiatal hernia.  No acute abnormality is noted.   Electronically Signed   By: Inez Catalina M.D.   On: 01/29/2014 20:02   Dg Abd 1 View  01/30/2014   CLINICAL DATA:  Abdominal distension  EXAM: ABDOMEN - 1 VIEW  COMPARISON:  CT scan  11/02/2003  FINDINGS: There is nonspecific nonobstructive bowel gas pattern. Some stool noted in right colon. Colonic gas noted in sigmoid colon without significant colonic distention.  IMPRESSION: Nonspecific nonobstructive bowel gas pattern. Some stool noted in right colon. Colonic gas in sigmoid colon without significant colonic distention per   Electronically Signed   By: Lahoma Crocker M.D.   On: 01/30/2014 11:06   Nm Myocar Multi W/spect W/wall Motion / Ef  01/31/2014   CLINICAL DATA:  History of MI. Chest pain. Prior CABG. Coronary artery disease.  EXAM: MYOCARDIAL IMAGING WITH SPECT (REST AND PHARMACOLOGIC-STRESS)  GATED LEFT VENTRICULAR WALL MOTION STUDY  LEFT VENTRICULAR EJECTION FRACTION  TECHNIQUE: Standard myocardial SPECT imaging was performed after resting intravenous injection of 10 mCi Tc-81m sestamibi. Subsequently, intravenous infusion of Lexiscan was performed under the supervision of the Cardiology staff. At peak effect of  the drug, 30 mCi Tc-39m sestamibi was injected intravenously and standard myocardial SPECT imaging was performed. Quantitative gated imaging was also performed to evaluate left ventricular wall motion, and estimate left ventricular ejection fraction.  COMPARISON:  None.  FINDINGS: Perfusion: There is a large fixed defect involving the inferior wall and inferior lateral wall. No abnormal areas of reversibility identified.  Wall Motion: Mild diffuse hypokinesis is identified.  Left Ventricular Ejection Fraction: 45 %  End diastolic volume 979 ml  End systolic volume 69 ml  IMPRESSION: 1. No reversible ischemia. Large infarct involves the inferior wall and inferior lateral wall.  2. Mildly impaired left ventricular systolic function.  3. Left ventricular ejection fraction 45%  4. Intermediate to high-risk stress test findings*.  *2012 Appropriate Use Criteria for Coronary Revascularization Focused Update: J Am Coll Cardiol. 8921;19(4):174-081.  http://content.[email or web address].aspx?articleid=[email or web address]   Electronically Signed   By: Kerby Moors M.D.   On: 01/31/2014 16:17     Microbiology: No results found for this or any previous visit (from the past 240 hour(s)).   Labs: Basic Metabolic Panel:  Recent Labs Lab 01/29/14 2056 01/31/14 0420 02/01/14 0433  NA 138 138 139  K 4.3 3.4* 4.3  CL 105 104 107  CO2 28 31 28   GLUCOSE 131* 98 112*  BUN 18 17 17   CREATININE 0.87 0.89 0.75  CALCIUM 9.0 8.4 8.6  MG  --   --  2.4   Liver Function Tests:  Recent Labs Lab 01/29/14 2056  AST 32  ALT 17  ALKPHOS 58  BILITOT 0.7  PROT 7.0  ALBUMIN 3.7    Recent Labs Lab 01/29/14 2056  LIPASE 26   No results for input(s): AMMONIA in the last 168 hours. CBC:  Recent Labs Lab 01/29/14 2056  WBC 6.6  HGB 13.5  HCT 41.7  MCV 99.3  PLT 244   Cardiac Enzymes:  Recent Labs Lab 01/29/14 2057 01/30/14 0300 01/30/14 0917  TROPONINI <0.03 <0.03 <0.03   BNP: Invalid input(s): POCBNP CBG: No results for input(s): GLUCAP in the last 168 hours.  Time coordinating discharge:  Greater than 30 minutes  Signed:  Sarahbeth Cashin, DO [facility] Pager: [phone] 02/01/2014, 11:15 AM

## 2014-02-01 NOTE — Progress Notes (Signed)
Subjective:  Feels better; no recurrent symptoms  Objective:   Vital Signs in the last 24 hours: Temp:  [97.5 F (36.4 C)-98.2 F (36.8 C)] 97.5 F (36.4 C) (12/29 0433) Pulse Rate:  [70-74] 71 (12/29 0433) Resp:  [18-20] 20 (12/29 0433) BP: (113-142)/(63-96) 137/74 mmHg (12/29 0433) SpO2:  [95 %-98 %] 95 % (12/29 0433)  Intake/Output from previous day: 12/28 0701 - 12/29 0700 In: 480 [P.O.:480] Out: -   I/O since admission:+1110  Medications: . aspirin EC  81 mg Oral Daily  . clopidogrel  75 mg Oral Daily  . enoxaparin (LOVENOX) injection  40 mg Subcutaneous QHS  . levothyroxine  100 mcg Oral QAC breakfast  . metoprolol tartrate  12.5 mg Oral BID  . pantoprazole  40 mg Oral BID  . pravastatin  40 mg Oral Daily  . sodium chloride  3 mL Intravenous Q12H  . sodium chloride  3 mL Intravenous Q12H  . torsemide  20 mg Oral QODAY       Physical Exam:   General appearance: alert, cooperative and no distress Neck: no adenopathy, no carotid bruit, no JVD, supple, symmetrical, trachea midline and thyroid not enlarged, symmetric, no tenderness/mass/nodules Lungs: clear to auscultation bilaterally Heart: regular rate and rhythm and 2/6 systolic murmur Abdomen: soft, non-tender; bowel sounds normal; no masses,  no organomegaly Extremities: no edema, redness or tenderness in the calves or thighs Neurologic: Grossly normal   Rate: 78  Rhythm: normal sinus rhythm    ECG (independent review by me): AV paced at 71  Lab Results:   Recent Labs  01/31/14 0420 02/01/14 0433  NA 138 139  K 3.4* 4.3  CL 104 107  CO2 31 28  GLUCOSE 98 112*  BUN 17 17  CREATININE 0.89 0.75     Recent Labs  01/30/14 0300 01/30/14 0917  TROPONINI <0.03 <0.03    Hepatic Function Panel  Recent Labs  01/29/14 2056  PROT 7.0  ALBUMIN 3.7  AST 32  ALT 17  ALKPHOS 58  BILITOT 0.7  BILIDIR 0.2  IBILI 0.5   No results for input(s): INR in the last 72 hours. BNP (last 3  results) No results for input(s): PROBNP in the last 8760 hours.  Lipid Panel     Component Value Date/Time   CHOL  09/19/2009 0354    93        ATP III CLASSIFICATION:  <200     mg/dL   Desirable  200-239  mg/dL   Borderline High  >=240    mg/dL   High          TRIG 98 09/19/2009 0354   HDL 34* 09/19/2009 0354   CHOLHDL 2.7 09/19/2009 0354   VLDL 20 09/19/2009 0354   LDLCALC  09/19/2009 0354    39        Total Cholesterol/HDL:CHD Risk Coronary Heart Disease Risk Table                     Men   Women  1/2 Average Risk   3.4   3.3  Average Risk       5.0   4.4  2 X Average Risk   9.6   7.1  3 X Average Risk  23.4   11.0        Use the calculated Patient Ratio above and the CHD Risk Table to determine the patient's CHD Risk.        ATP III CLASSIFICATION (LDL):  <  100     mg/dL   Optimal  100-129  mg/dL   Near or Above                    Optimal  130-159  mg/dL   Borderline  160-189  mg/dL   High  >190     mg/dL   Very High      Imaging:  Nm Myocar Multi W/spect W/wall Motion / Ef  01/31/2014   CLINICAL DATA:  History of MI. Chest pain. Prior CABG. Coronary artery disease.  EXAM: MYOCARDIAL IMAGING WITH SPECT (REST AND PHARMACOLOGIC-STRESS)  GATED LEFT VENTRICULAR WALL MOTION STUDY  LEFT VENTRICULAR EJECTION FRACTION  TECHNIQUE: Standard myocardial SPECT imaging was performed after resting intravenous injection of 10 mCi Tc-16m sestamibi. Subsequently, intravenous infusion of Lexiscan was performed under the supervision of the Cardiology staff. At peak effect of the drug, 30 mCi Tc-76m sestamibi was injected intravenously and standard myocardial SPECT imaging was performed. Quantitative gated imaging was also performed to evaluate left ventricular wall motion, and estimate left ventricular ejection fraction.  COMPARISON:  None.  FINDINGS: Perfusion: There is a large fixed defect involving the inferior wall and inferior lateral wall. No abnormal areas of reversibility  identified.  Wall Motion: Mild diffuse hypokinesis is identified.  Left Ventricular Ejection Fraction: 45 %  End diastolic volume 818 ml  End systolic volume 69 ml  IMPRESSION: 1. No reversible ischemia. Large infarct involves the inferior wall and inferior lateral wall.  2. Mildly impaired left ventricular systolic function.  3. Left ventricular ejection fraction 45%  4. Intermediate to high-risk stress test findings*.  *2012 Appropriate Use Criteria for Coronary Revascularization Focused Update: J Am Coll Cardiol. 5631;49(7):026-378. http://content.airportbarriers.com.aspx?articleid=1201161   Electronically Signed   By: Kerby Moors M.D.   On: 01/31/2014 16:17      Assessment/Plan:   Active Problems:   CAD (coronary artery disease)   Dizziness, after diuretic asscoiated with hypotension and responded to fluid bolus   Cardiomyopathy, ischemic   Chest pain   Hyperlipidemia   Hypokalemia   Sick sinus syndrome  1. CAD: s/p CABG 01/16/1998; s/p BMS to SVG-RCA 07/2009. 2. S/P pacemaker: currently AV paced on today's ECG; h/ o sinus node dysfunction 3. Dizziness; improved today 4. Ischemic cardiomyopathy with EF 45%   I have personnally reviewed the nuclear images from yesterday and compared them to 12/2012 study. Old large in size and severe in intensity defect in the mid to upper inferior to lateral inferior wall with very mild peri-infarct ischemia. No significant change from prior study. Ne new areas of hypoperfusion. Would therefore continue with medical management and titrate lopressor to 25 mg bid (he has a pacemaker to prevent bradycardia) and consider very low dose imdur at 15 mg as BP allows with outpatient f/u with Dr. Sallyanne Kuster. No need for repeat cath presently.  Adam Sine, MD, [facility] 02/01/2014, 9:10 AM

## 2014-02-01 NOTE — Progress Notes (Signed)
VASCULAR LAB PRELIMINARY  PRELIMINARY  PRELIMINARY  PRELIMINARY  Bilateral lower extremity venous duplex completed.    Preliminary report:  Bilateral:  No evidence of DVT, superficial thrombosis, or Baker's Cyst.   Adam [name], RVS 02/01/2014, 9:37 AM

## 2014-02-10 ENCOUNTER — Telehealth: Payer: Self-pay | Admitting: Cardiovascular Disease

## 2014-02-10 NOTE — Telephone Encounter (Signed)
Spoke with pt, he cont to be dizzy, this has been going on for several weeks now but for the last couple days it has gotten worse. He is dizzy most of the time and it gets worse with standing from sitting. Lying down after about 10 min will make it go away. His metoprolol was recently increased at discharge but he was having the dizziness prior to admission. His bp is running 110 to 100 and his normal is usually 120. He feels he is well hydrated, he has no sinus issues at this time. His PCP insisted he give [place] a call. His bp at present is 100, advised patient to take 1/2 his metoprolol dosage for this evening and I will forward this to dr croitoru for his review. Orthostatic precautions discussed with patient including exercises prior to getting up to help the autonomic system.

## 2014-02-10 NOTE — Telephone Encounter (Signed)
If symptoms same after 48 hours on lower dose metoprolol, stop it completely.

## 2014-02-10 NOTE — Telephone Encounter (Signed)
Mr.Vue is calling because he is having a lot of dizziness and some weakness and almost passed out in the shower on last night . Please call    Thanks

## 2014-02-11 NOTE — Telephone Encounter (Signed)
Instruction given to patient.she verbalized understanding.  Patient states he will call back to inform office of progress.

## 2014-02-27 ENCOUNTER — Other Ambulatory Visit: Payer: Self-pay | Admitting: Cardiovascular Disease

## 2014-02-28 NOTE — Telephone Encounter (Signed)
Rx refill sent to patient pharmacy   

## 2014-04-07 ENCOUNTER — Encounter: Payer: Self-pay | Admitting: Cardiovascular Disease

## 2014-05-05 ENCOUNTER — Encounter: Payer: Self-pay | Admitting: *Deleted

## 2014-06-01 ENCOUNTER — Encounter: Payer: Self-pay | Admitting: *Deleted

## 2014-06-08 ENCOUNTER — Encounter: Payer: Self-pay | Admitting: Cardiovascular Disease

## 2014-06-08 ENCOUNTER — Ambulatory Visit (INDEPENDENT_AMBULATORY_CARE_PROVIDER_SITE_OTHER): Payer: Medicare Other | Admitting: *Deleted

## 2014-06-08 DIAGNOSIS — I495 Sick sinus syndrome: Secondary | ICD-10-CM

## 2014-06-08 DIAGNOSIS — R001 Bradycardia, unspecified: Secondary | ICD-10-CM | POA: Diagnosis not present

## 2014-06-08 LAB — CUP PACEART INCLINIC DEVICE CHECK
Battery Remaining Longevity: 52.8 mo
Battery Voltage: 2.86 V
Brady Statistic RA Percent Paced: 86 %
Brady Statistic RV Percent Paced: 42 %
Date Time Interrogation Session: 20160504154829
Lead Channel Impedance Value: 425 Ohm
Lead Channel Impedance Value: 525 Ohm
Lead Channel Pacing Threshold Amplitude: 0.625 V
Lead Channel Pacing Threshold Amplitude: 1 V
Lead Channel Pacing Threshold Amplitude: 1 V
Lead Channel Pacing Threshold Pulse Width: 0.4 ms
Lead Channel Pacing Threshold Pulse Width: 0.4 ms
Lead Channel Pacing Threshold Pulse Width: 0.4 ms
Lead Channel Sensing Intrinsic Amplitude: 12 mV
Lead Channel Sensing Intrinsic Amplitude: 2.1 mV
Lead Channel Setting Pacing Amplitude: 0.875
Lead Channel Setting Pacing Amplitude: 2.5 V
Lead Channel Setting Pacing Pulse Width: 0.4 ms
Lead Channel Setting Sensing Sensitivity: 2 mV
Pulse Gen Model: 2210
Pulse Gen Serial Number: 2173363

## 2014-06-08 NOTE — Progress Notes (Signed)
Pacemaker check in clinic. Normal device function. Thresholds, sensing, impedances consistent with previous measurements. Device programmed to maximize longevity. 2 mode switches (<1%)---max dur. 8 sec, Max A 162, Max V 73---AT. 1 NSVT episode noted (3/18) x 34 bts @ ~200bpm. Device programmed at appropriate safety margins. Histogram distribution appropriate for patient activity level. Device programmed to optimize intrinsic conduction. Estimated longevity 4.4 years. Patient will follow up with [facility] on 8/2 @ 0845.

## 2014-07-14 ENCOUNTER — Ambulatory Visit (INDEPENDENT_AMBULATORY_CARE_PROVIDER_SITE_OTHER): Payer: Medicare Other | Admitting: Internal Medicine

## 2014-07-14 ENCOUNTER — Encounter: Payer: Self-pay | Admitting: Internal Medicine

## 2014-07-14 ENCOUNTER — Ambulatory Visit
Admission: RE | Admit: 2014-07-14 | Discharge: 2014-07-14 | Disposition: A | Discharge status: Home / Self Care | Payer: Medicare Other | Source: Ambulatory Visit | Attending: Internal Medicine | Admitting: Internal Medicine

## 2014-07-14 ENCOUNTER — Other Ambulatory Visit (INDEPENDENT_AMBULATORY_CARE_PROVIDER_SITE_OTHER): Payer: Medicare Other

## 2014-07-14 VITALS — BP 122/80 | HR 92 | Ht 71.0 in | Wt 180.0 lb

## 2014-07-14 DIAGNOSIS — R058 Other specified cough: Secondary | ICD-10-CM

## 2014-07-14 DIAGNOSIS — I25810 Atherosclerosis of coronary artery bypass graft(s) without angina pectoris: Secondary | ICD-10-CM

## 2014-07-14 DIAGNOSIS — R05 Cough: Secondary | ICD-10-CM

## 2014-07-14 LAB — CBC WITH DIFFERENTIAL/PLATELET
Basophils Absolute: 0 10*3/uL (ref 0.0–0.1)
Basophils Relative: 0.5 % (ref 0.0–3.0)
Eosinophils Absolute: 0.2 10*3/uL (ref 0.0–0.7)
Eosinophils Relative: 2.4 % (ref 0.0–5.0)
HCT: 43.7 % (ref 39.0–52.0)
Hemoglobin: 14.5 g/dL (ref 13.0–17.0)
Lymphocytes Relative: 20.2 % (ref 12.0–46.0)
Lymphs Abs: 1.5 10*3/uL (ref 0.7–4.0)
MCHC: 33.2 g/dL (ref 30.0–36.0)
MCV: 96 fl (ref 78.0–100.0)
Monocytes Absolute: 0.7 10*3/uL (ref 0.1–1.0)
Monocytes Relative: 9.8 % (ref 3.0–12.0)
Neutro Abs: 4.9 10*3/uL (ref 1.4–7.7)
Neutrophils Relative %: 67.1 % (ref 43.0–77.0)
Platelets: 298 10*3/uL (ref 150.0–400.0)
RBC: 4.56 Mil/uL (ref 4.22–5.81)
RDW: 14.5 % (ref 11.5–15.5)
WBC: 7.4 10*3/uL (ref 4.0–10.5)

## 2014-07-14 NOTE — Patient Instructions (Addendum)
Please see patient coordinator before you leave today  to schedule sinus CT   Please remember to go to the lab and x-ray department downstairs for your tests - we will call you with the results when they are available.   Pantoprazole (protonix) 40 mg   Take  30-60 min before first meal of the day and Pepcid ac (famotidine)  20 mg one @  bedtime until return to office - this is the best way to tell whether stomach acid is contributing to your problem.    GERD (REFLUX)  is an extremely common cause of respiratory symptoms just like yours , many times with no obvious heartburn at all.    It can be treated with medication, but also with lifestyle changes including avoidance of late meals, elevation of the head of your bed (ideally with 6 inch  bed blocks) excessive alcohol, smoking cessation, and avoid fatty foods, chocolate, peppermint, colas, red wine, and acidic juices such as orange juice.  NO MINT OR MENTHOL PRODUCTS SO NO COUGH DROPS  USE SUGARLESS CANDY INSTEAD (Jolley ranchers or Stover's or Life Savers) or even ice chips will also do - the key is to swallow to prevent all throat clearing. NO OIL BASED VITAMINS - use powdered substitutes.    Please schedule a follow up office visit in 4 weeks, sooner if needed  Did not go for cxr > needs one on return

## 2014-07-14 NOTE — Assessment & Plan Note (Signed)
The most common causes of chronic cough in immunocompetent adults include the following: upper airway cough syndrome (UACS), previously referred to as postnasal drip syndrome (PNDS), which is caused by variety of rhinosinus conditions; (2) asthma; (3) GERD; (4) chronic bronchitis from cigarette smoking or other inhaled environmental irritants; (5) nonasthmatic eosinophilic bronchitis; and (6) bronchiectasis.   These conditions, singly or in combination, have accounted for up to 94% of the causes of chronic cough in prospective studies.   Other conditions have constituted no >6% of the causes in prospective studies These have included bronchogenic carcinoma, chronic interstitial pneumonia, sarcoidosis, left ventricular failure, ACEI-induced cough, and aspiration from a condition associated with pharyngeal dysfunction.    Chronic cough is often simultaneously caused by more than one condition. A single cause has been found from 38 to 82% of the time, multiple causes from 18 to 62%. Multiply caused cough has been the result of three diseases up to 42% of the time.       Based on hx and exam, this is most likely:  Classic Upper airway cough syndrome, so named because it's frequently impossible to sort out how much is  CR/sinusitis with freq throat clearing (which can be related to primary GERD)   vs  causing  secondary (" extra esophageal")  GERD from wide swings in gastric pressure that occur with throat clearing, often  promoting self use of mint and menthol lozenges that reduce the lower esophageal sphincter tone and exacerbate the problem further in a cyclical fashion.   These are the same pts (now being labeled as having "irritable larynx syndrome" by some cough centers) who not infrequently have a history of having failed to tolerate ace inhibitors,  dry powder inhalers or biphosphonates or report having atypical reflux (based on how large a HH I assume he has GERD)  symptoms that don't respond to  standard doses of PPI , and are easily confused as having aecopd or asthma flares by even experienced allergists/ pulmonologists.   The first step is to maximize acid suppression / rx for gerd and eval for sinus dz / rhinitis   then regroup if the cough persists.  I had an extended discussion with the patient and daughter Adam [name]  reviewing all relevant studies completed to date and outlining w/u planned  and  Lasting 74 m  Each maintenance medication was reviewed in detail including most importantly the difference between maintenance and as needed and under what circumstances the prns are to be used.  Please see instructions for details which were reviewed in writing and the patient given a copy.

## 2014-07-14 NOTE — Assessment & Plan Note (Signed)
On asa and plavix with streaky hemoptysis      Lab 07/14/14 1725  HGB 14.5     For now ok to continue both but may need to hold plavix if worsens.

## 2014-07-14 NOTE — Progress Notes (Signed)
Subjective:    Patient ID: Adam [name], male    DOB: 06-28-1926,     MRN: [id number]  HPI  60 yowm never smoker new onset cough summer of 2015 with some bloody mucus/ turned yellow p  zpak esp in am's then bloody again x end of May  2016 > [name] referred 07/14/2014 to pulmonary clinic     07/14/2014 1st [facility] office visit/ Adam [name]   Chief Complaint  Patient presents with  . Pulmonary Consult    Referred by [name].  Pt c/o cough for the past year or so- prod in the am with yellow sputum. He has had blood streaked sputum for the past 3 wks.  He feels weak in general and voice has changed. He also has had night sweats.   pt has baseline gerd / large HH on ppi with bfast daily but most severe coughing is before bfast, not really much worse x one year but now blood streaked- no epistaxis/ on asa and plavix   No obvious day to day or daytime variabilty or assoc sob or cp or chest tightness, subjective wheeze overt sinus or hb symptoms. No unusual exp hx or h/o childhood pna/ asthma or knowledge of premature birth.  Sleeping ok without nocturnal   exacerbation  of respiratory  c/o's or need for noct saba. Also denies any obvious fluctuation of symptoms with weather or environmental changes or other aggravating or alleviating factors except as outlined above   Current Medications, Allergies, Complete Past Medical History, Past Surgical History, Family History, and Social History were reviewed in [employer] record.          Review of Systems  Constitutional: Negative for fever, chills, activity change, appetite change and unexpected weight change.  HENT: Positive for sneezing. Negative for congestion, dental problem, postnasal drip, rhinorrhea, sore throat, trouble swallowing and voice change.   Eyes: Negative for visual disturbance.  Respiratory: Positive for cough. Negative for choking and shortness of breath.   Cardiovascular: Negative for chest  pain and leg swelling.  Gastrointestinal: Negative for nausea, vomiting and abdominal pain.  Genitourinary: Negative for difficulty urinating.  Musculoskeletal: Negative for arthralgias.  Skin: Negative for rash.  Psychiatric/Behavioral: Negative for behavioral problems and confusion.       Objective:   Physical Exam   amb wm nad / mild nasal tone to voice   Wt Readings from Last 3 Encounters:  07/14/14 180 lb (81.647 kg)  01/29/14 187 lb 3.2 oz (84.913 kg)  10/12/13 182 lb 1.6 oz (82.6 kg)    Vital signs reviewed  HEENT: nl dentition, turbinates, and orophanx. Nl external ear canals without cough reflex   NECK :  without JVD/Nodes/TM/ nl carotid upstrokes bilaterally   LUNGS: no acc muscle use, clear to A and P bilaterally without cough on insp or exp maneuvers   CV:  RRR  no s3 or murmur or increase in P2, no edema   ABD:  soft and nontender with nl excursion in the supine position. No bruits or organomegaly, bowel sounds nl  MS:  warm without deformities, calf tenderness, cyanosis or clubbing  SKIN: warm and dry without lesions    NEURO:  alert, approp, no deficits   I personally reviewed images and agree with radiology impression as follows:  CXR:  01/30/15  Stable large hiatal hernia. No acute abnormality is noted.   CXR PA and Lateral:   07/14/2014 :     I personally  reviewed images and agree with radiology impression as follows:   Did not go for cxr   Studies ordered: sinus ct/allergy profile       Assessment & Plan:

## 2014-07-15 LAB — ALLERGY FULL PROFILE
Allergen, D pternoyssinus,d7: <0.1 kU/L
Allergen,Goose feathers, e70: <0.1 kU/L
Alternaria Alternata: <0.1 kU/L
Aspergillus fumigatus, m3: <0.1 kU/L
Bahia Grass: <0.1 kU/L
Bermuda Grass: <0.1 kU/L
Box Elder IgE: <0.1 kU/L
Candida Albicans: <0.1 kU/L
Cat Dander: <0.1 kU/L
Common Ragweed: <0.1 kU/L
Curvularia lunata: <0.1 kU/L
D. farinae: <0.1 kU/L
Dog Dander: <0.1 kU/L
Elm IgE: <0.1 kU/L
Fescue: <0.1 kU/L
G005 Rye, Perennial: <0.1 kU/L
G009 Red Top: <0.1 kU/L
Goldenrod: <0.1 kU/L
Helminthosporium halodes: <0.1 kU/L
House Dust Hollister: <0.1 kU/L
IgE (Immunoglobulin E), Serum: 16 kU/L (ref <115)
Lamb's Quarters: <0.1 kU/L
Oak: <0.1 kU/L
Plantain: <0.1 kU/L
Stemphylium Botryosum: <0.1 kU/L
Sycamore Tree: <0.1 kU/L
Timothy Grass: <0.1 kU/L

## 2014-07-15 NOTE — Progress Notes (Signed)
Quick Note:  LMTCB ______ 

## 2014-07-21 ENCOUNTER — Ambulatory Visit (INDEPENDENT_AMBULATORY_CARE_PROVIDER_SITE_OTHER)
Admission: RE | Admit: 2014-07-21 | Discharge: 2014-07-21 | Disposition: A | Discharge status: Home / Self Care | Payer: Medicare Other | Source: Ambulatory Visit | Attending: Internal Medicine | Admitting: Internal Medicine

## 2014-07-21 DIAGNOSIS — R05 Cough: Secondary | ICD-10-CM | POA: Diagnosis not present

## 2014-07-21 DIAGNOSIS — R058 Other specified cough: Secondary | ICD-10-CM

## 2014-07-22 NOTE — Progress Notes (Signed)
Quick Note:  Spoke with pt and notified of results per Dr. Wert. Pt verbalized understanding and denied any questions.  ______ 

## 2014-07-24 ENCOUNTER — Other Ambulatory Visit: Payer: Self-pay | Admitting: Cardiovascular Disease

## 2014-07-25 NOTE — Telephone Encounter (Signed)
Rx(s) sent to pharmacy electronically.  

## 2014-07-26 ENCOUNTER — Telehealth: Payer: Self-pay | Admitting: Cardiovascular Disease

## 2014-07-26 ENCOUNTER — Ambulatory Visit (INDEPENDENT_AMBULATORY_CARE_PROVIDER_SITE_OTHER): Payer: Medicare Other | Admitting: *Deleted

## 2014-07-26 DIAGNOSIS — I472 Ventricular tachycardia, unspecified: Secondary | ICD-10-CM

## 2014-07-26 DIAGNOSIS — R001 Bradycardia, unspecified: Secondary | ICD-10-CM

## 2014-07-26 LAB — CUP PACEART INCLINIC DEVICE CHECK
Battery Remaining Longevity: 52.8 mo
Battery Voltage: 2.86 V
Brady Statistic RA Percent Paced: 84 %
Brady Statistic RV Percent Paced: 36 %
Date Time Interrogation Session: 20160621142032
Lead Channel Impedance Value: 412.5 Ohm
Lead Channel Impedance Value: 525 Ohm
Lead Channel Pacing Threshold Amplitude: 0.625 V
Lead Channel Pacing Threshold Amplitude: 1.875 V
Lead Channel Pacing Threshold Pulse Width: 0.4 ms
Lead Channel Pacing Threshold Pulse Width: 0.4 ms
Lead Channel Sensing Intrinsic Amplitude: 12 mV
Lead Channel Sensing Intrinsic Amplitude: 3.4 mV
Lead Channel Setting Pacing Amplitude: 0.875
Lead Channel Setting Pacing Amplitude: 2.5 V
Lead Channel Setting Pacing Pulse Width: 0.4 ms
Lead Channel Setting Sensing Sensitivity: 2 mV
Pulse Gen Model: 2210
Pulse Gen Serial Number: 2173363

## 2014-07-26 MED ORDER — AMIODARONE HCL 200 MG PO TABS: 400.0000 mg | ORAL_TABLET | Freq: Every day | ORAL | Status: DC

## 2014-07-26 NOTE — Telephone Encounter (Signed)
Talked to patient, reviewed pacemaker download and orthostatic vital signs. Dizziness on Saturday related to a lengthy episode of sustained monomorphic VT at 150 bpm, direct onset without long-short sequence. Current rhythm A paced V sensed, still a little dizzy, not as bad as Saturday. Mild ischemic cardiomyopathy with recent (December) nuclear stress test with large inferior and inferolateral scar, no reversible ischemia, EF 45%. Beta blockers gradually reduced and eventually stopped due to severe orthostatic hypotension, lifestyle limiting. Plan: EP referral - Dr. Caryl Comes, STAT BMET and Mg, ASAP office visit with me, start amiodarone, reprogram pacemaker to lower VT monitor zone since he appears to have VT cycle length right at current detection zone - may be missing some events. Consider repeat ischemia evaluation - but most likely this is inferior wall scar VT. He has normal renal function, consider coronary angiography.

## 2014-07-26 NOTE — Progress Notes (Signed)
Pacemaker check in clinic per Dr.Croitoru. HVR detection rate decreased from 150 bpm to 125 bpm. Testing was not performed. Patient to follow up with [facility] on 6/22 as scheduled.

## 2014-07-26 NOTE — Telephone Encounter (Addendum)
Spoke with pt, he woke Saturday night to go to the BR and he could not get out of bed, he was too weak and dizzy. He also reports chest discomfort that he took 2 NTG for and got relief. He cont to have weakness and dizziness throughout the weekend and today. His bp is running 120-122. He denies SOB. He has generalized weakness and the dizziness is more pronounced with standing. Discussed with dr croitoru, pt to sent a transmission from his device. He is also to have his wife check his bp standing and sitting. They will call back with those results.

## 2014-07-26 NOTE — Telephone Encounter (Signed)
Returning your call. °

## 2014-07-26 NOTE — Telephone Encounter (Signed)
Spoke with pt, sitting bp was 111/68 pulse 70 Standing immediate 115/78 pulse 69 and then after 3 min 102/67 pulse 70. He has sent the transmission. Will make dr croitoru aware.

## 2014-07-26 NOTE — Telephone Encounter (Signed)
[name] is calling because  Mr. Adam [name] had an episode on Saturday night about 12am , extreme dizziness and weakness and was not communicating with her for a minute , since the episode he still is not back to his normal state of well being , no energy. He has had a silent heart attack in the past and she wants to be sure he didn't have another one . Please call .Marland Kitchen    Thanks

## 2014-07-26 NOTE — Telephone Encounter (Signed)
Transmission reviewed w/Dr. Croitoru---VT noted on device from 6/19.  Per [facility]: Start Amiodarone 400 mg daily, labs: BMP/Mag, Decrease HVR detection rate from 150 bpm to 130 bpm, Referral to EP first available.  Dr.Croitoru spoke to patient about episode and explained risks and benefits of Amiodarone.  Patient to f/u in [facility] device clinic today (6/21) @ 1400pm for reprogramming.

## 2014-07-27 ENCOUNTER — Ambulatory Visit (INDEPENDENT_AMBULATORY_CARE_PROVIDER_SITE_OTHER): Payer: Medicare Other | Admitting: Cardiovascular Disease

## 2014-07-27 ENCOUNTER — Encounter: Payer: Self-pay | Admitting: Cardiovascular Disease

## 2014-07-27 VITALS — BP 122/72 | HR 70 | Ht 71.0 in | Wt 177.0 lb

## 2014-07-27 DIAGNOSIS — I472 Ventricular tachycardia, unspecified: Secondary | ICD-10-CM

## 2014-07-27 DIAGNOSIS — I2510 Atherosclerotic heart disease of native coronary artery without angina pectoris: Secondary | ICD-10-CM

## 2014-07-27 DIAGNOSIS — E785 Hyperlipidemia, unspecified: Secondary | ICD-10-CM | POA: Diagnosis not present

## 2014-07-27 HISTORY — DX: Ventricular tachycardia, unspecified: I47.20

## 2014-07-27 HISTORY — DX: Ventricular tachycardia: I47.2

## 2014-07-27 LAB — BASIC METABOLIC PANEL
BUN: 17 mg/dL (ref 6–23)
CO2: 28 mEq/L (ref 19–32)
Calcium: 9.3 mg/dL (ref 8.4–10.5)
Chloride: 102 mEq/L (ref 96–112)
Creat: 0.81 mg/dL (ref 0.50–1.35)
Glucose, Bld: 83 mg/dL (ref 70–99)
Potassium: 4.8 mEq/L (ref 3.5–5.3)
Sodium: 140 mEq/L (ref 135–145)

## 2014-07-27 LAB — MAGNESIUM: Magnesium: 2.4 mg/dL (ref 1.5–2.5)

## 2014-07-27 NOTE — Progress Notes (Signed)
Patient ID: Adam [name], male   DOB: 1926-06-28, 81 y.o.   MRN: [id number]      Cardiology Office Note   Date:  07/27/2014   ID:  Adam [name], DOB April 14, 1926, MRN [id number]  PCP:  Horatio Pel, MD  Cardiologist:   [name], MD   Chief Complaint  Patient presents with  . Follow-up    Patient has felt light headed, dizzy, and some swelling.      History of Present Illness: Adam [name] is a 81 y.o. male who presents for sustained symptomatic ventricular tachycardia.  He has long-standing coronary artery disease and underwent bypass surgery 1996.  He had placement of a stent to the SVG to RCA in 2011. His last cardiac catheterization in 2012 show that vessel to be widely patent but all his native coronary arteries are occluded and he is graft dependent. He is nuclear stress test in December 2015 showed an extensive inferior wall scar without any areas of ischemia. He has had problems with hypotension and is sensitive to diuretic therapy. Beta blockers have been discontinued and loop diuretics have to be used judiciously. He has been hospitalized for intravenous fluid administration not long ago.He has mild ischemic cardiomyopathy with a left ventricular ejection fraction estimated at about 45% and has not had a lot of problems with heart failure decompensation  Last Saturday night he expressed rapid palpitations associated with dizziness/near syncope, shortness of breath and extreme fatigue. He did not lose consciousness and denies angina pectoris. The symptoms lasted for several hours and resolved spontaneously. He called yesterday since he was still feeling a little dizzy. Orthostatic vital signs did not show hypotension.  Pacemaker download showed that his more severe symptoms on Saturday were associated with a prolonged episode of relatively slow sustained ventricular tachycardia with a cycle length of around 400 ms. His pacemaker VT monitor zone was set at 150 bpm and is  possible that the arrhythmia continued longer than the reported recording time. By intracardiac electrogram he seems to have monomorphic regular VT. Arrhythmia onset was not associated with a clear trigger. Some pre-mature atrial contractions were seen before its onset , but the initial beats of the tachycardia has morphology identical with the sustained arrhythmia.  At the time that he performed a pacemaker download, the rhythm was normal atrial paced ventricular sensed.   All other features of his pacemaker check (St. Jude Accent DR implanted 2010) were normal. Expected generator longevity is  4.5 years, there is 84% atrial pacing and 36% ventricular pacing. There have been no episodes of atrial fibrillation.   over the last several years he has had progressive worsening of orthostatic hypotension and his beta blockers have been gradually weaned until they have been completely discontinued. Has not tolerated even tiny doses of beta blocker. He has treated hypothyroidism and hyperlipidemia.    Past Medical History  Diagnosis Date  . CAD (coronary artery disease)     CABG (LIMA-LAD, SVG-RCA, SVG-OM in 1996).  07/2009 BMS to SVG-RCA. Cath in 04/2010 with patent stents   . Bradycardia     AFib/SSS s/p St Jude PPM 04/12/2008  . Hypothyroid   . Pacemaker   . Hyperlipidemia   . GERD (gastroesophageal reflux disease)   . Hiatal hernia   . Insomnia   . S/P CABG x 4   . RBBB   . Melanoma of back 1976  . Heart murmur     "just told I had one today" (12/15/2012)  . Myocardial  infarction 1996; 2011    "both silent" (12/15/2012)  . Exertional shortness of breath     "sometimes walking" (12/15/2012)  . Arthritis     "minor, back and sometimes knees" (12/15/2012)  . Sustained ventricular tachycardia 07/27/2014    Past Surgical History  Procedure Laterality Date  . US echocardiography  07/11/2009    EF 45-50%  . Nm myoview ltd  06/2011    low risk  . Coronary artery bypass graft  1996    LIMA to  LAD,SVG to RCA & SVG to OM  . Tonsillectomy  1938  . Appendectomy  1942  . Coronary angioplasty with stent placement  07/2009    bare metal stent to SVG to the RCA  . Cardiac catheterization  04/2010    LIMA to LAD patent,SVG to OM patent,no in-stnet restenosis RCA  . Cataract extraction w/ intraocular lens  implant, bilateral Bilateral 2012  . Melanoma excision  05/1974 X2    "taken off my back" (12/15/2012)  . Transurethral resection of prostate  1986  . Insert / replace / remove pacemaker  2010     Current Outpatient Prescriptions  Medication Sig Dispense Refill  . amiodarone (PACERONE) 200 MG tablet Take 2 tablets (400 mg total) by mouth daily. 60 tablet 3  . aspirin EC 81 MG EC tablet Take 1 tablet (81 mg total) by mouth daily.    . clopidogrel (PLAVIX) 75 MG tablet Take 75 mg by mouth daily.    Marland Kitchen levothyroxine (SYNTHROID, LEVOTHROID) 100 MCG tablet Take 100 mcg by mouth daily.    Marland Kitchen NITROSTAT 0.4 MG SL tablet PLACE 1 TABLET UNDER TONGUE EVERY 5 MINUTES FOR UP TO 3 DOSES AS NEEDED FOR CHEST PAIN 25 tablet 3  . Nutritional Supplements (VITAMIN D MAINTENANCE PO) Take 5,000 Units by mouth daily.    . pantoprazole (PROTONIX) 40 MG tablet TAKE 1 TABLET BY MOUTH TWICE A DAY 60 tablet 8  . pravastatin (PRAVACHOL) 80 MG tablet Take 40 mg by mouth daily.    Marland Kitchen torsemide (DEMADEX) 20 MG tablet Take 20 mg by mouth every other day.      No current facility-administered medications for this visit.    Allergies:   Altace; Penicillins; and Sulfa antibiotics    Social History:  The patient  reports that he has never smoked. He has never used smokeless tobacco. He reports that he does not drink alcohol or use illicit drugs.   Family History:  The patient's family history includes Coronary artery disease in his father and mother.    ROS:  Please see the history of present illness.    Otherwise, review of systems positive for none.   All other systems are reviewed and negative.    PHYSICAL  EXAM: VS:  BP 122/72 mmHg  Pulse 70  Ht 5\' 11"  (1.803 m)  Wt 177 lb (80.287 kg)  BMI 24.70 kg/m2 , BMI Body mass index is 24.7 kg/(m^2).  General: Alert, oriented x3, no distress  Head: no evidence of trauma, PERRL, EOMI, no exophtalmos or lid lag, no myxedema, no xanthelasma; normal ears, nose and oropharynx  Neck: normal jugular venous pulsations and no hepatojugular reflux; brisk carotid pulses without delay and no carotid bruits  Chest: clear to auscultation, no signs of consolidation by percussion or palpation, normal fremitus, symmetrical and full respiratory excursions ; healthy subclavian pacemaker site  Cardiovascular: normal position and quality of the apical impulse, regular rhythm, normal first and widely split second heart sounds, nno rubs  or gallops, grade 1/6 soft systolic murmur at the apex  Abdomen: no tenderness or distention, no masses by palpation, no abnormal pulsatility or arterial bruits, normal bowel sounds, no hepatosplenomegaly  Extremities: no clubbing, cyanosis or edema; 2+ radial, ulnar and brachial pulses bilaterally; 2+ right femoral, posterior tibial and dorsalis pedis pulses; 2+ left femoral, posterior tibial and dorsalis pedis pulses; no subclavian or femoral bruits  Neurological: grossly nonfocal  Psych: euthymic mood, full affect   EKG:  EKG is ordered today. The ekg ordered today demonstrates Atrial paced, ventricular sensed, right bundle branch block (QRS 168 ms),  Inferior MI, ST depression in the inferolateral leads. QTC 481 ms; no significant change   Recent Labs: 01/29/2014: ALT 17; B Natriuretic Peptide 50.1 07/14/2014: Hemoglobin 14.5; Platelets 298.0 07/26/2014: BUN 17; Creat 0.81; Magnesium 2.4; Potassium 4.8; Sodium 140    Lipid Panel    Component Value Date/Time   CHOL  09/19/2009 0354    93        ATP III CLASSIFICATION:  <200     mg/dL   Desirable  200-239  mg/dL   Borderline High  >=240    mg/dL   High          TRIG 98  09/19/2009 0354   HDL 34* 09/19/2009 0354   CHOLHDL 2.7 09/19/2009 0354   VLDL 20 09/19/2009 0354   LDLCALC  09/19/2009 0354    39        Total Cholesterol/HDL:CHD Risk Coronary Heart Disease Risk Table                     Men   Women  1/2 Average Risk   3.4   3.3  Average Risk       5.0   4.4  2 X Average Risk   9.6   7.1  3 X Average Risk  23.4   11.0        Use the calculated Patient Ratio above and the CHD Risk Table to determine the patient's CHD Risk.        ATP III CLASSIFICATION (LDL):  <100     mg/dL   Optimal  100-129  mg/dL   Near or Above                    Optimal  130-159  mg/dL   Borderline  160-189  mg/dL   High  >190     mg/dL   Very High      Wt Readings from Last 3 Encounters:  07/27/14 177 lb (80.287 kg)  07/14/14 180 lb (81.647 kg)  01/29/14 187 lb 3.2 oz (84.913 kg)      ASSESSMENT AND PLAN:  1. Sustained symptomatic ventricular tachycardia -  This was relatively slow at 150 bpm , monomorphic and most likely represents inferior wall scar VT. He will not tolerate beta blockers. Have started amiodarone 400 mg daily with plan to reduce to 200 mg daily if this controls the arrhythmia. Labs do not show evidence of electrolyte abnormalities. He does not have angina and a recent nuclear stress test did not show ischemia. Discussed with Dr. Caryl Comes who raised the possibility that he may need renewed ischemia workup. He has an appointment with Dr. Caryl Comes in a week's time. I think if he still believes that this is necessary after reviewing the data, I would proceed directly to coronary angiography since his recent nuclear stress test was  Negative for ischemia.  Briefly discussed the  topic of implantable defibrillator therapy , pointing out that that age 81 this is less likely to have significant beneficial yield then in a younger patient. Also reviewed the fact that ablation therapy is sometimes used for ventricular tachycardia , but that the rate of success is limited  in patients with ischemic heart disease.  I am hopeful that the amiodarone will suppressed arrhythmia or at least lengthen the cycle length to the point where it is even less symptomatic.  Discussed potential side effects of amiodarone, the need for periodic liver function tests, thyroid function tests and ophthalmological exam , protection from sun exposure and possibility of lung injury that can be very serious.  2.CAD (coronary artery disease) He has occluded native arteries and is graft dependent (LIMA to LAD, SVG to RCA, SVG to OM; status post stent in SVG to RCA 2011, patent by cath March 2012). November 2014 nuclear study shows inferior scar without ischemia, EF 41%.  It is possible that there has been interval occlusion of his saphenous vein graft to the right coronary artery in the last 4 years  3. Cardiomyopathy, ischemic NYHA functional class II status, mild intermittent hypervolemia, primarily manifest as ankle edema. He does not tolerate ACE inhibitors due to cough, but is on beta blockers and intermittent loop diuretics. Angiotensin receptor blockers are considered but felt to be contraindicated because of his tendency to symptomatic hypotension.  4. Pacemaker Normal device function. No permanent reprogramming changes were necessary today. High-frequency ventricular pacing persists and we have been unable to mitigate this with any type of reprogramming strategy. Biventricular pacing does not appear justified at this point in this 81 year old gentleman with class II symptoms.   Current medicines are reviewed at length with the patient today.  The patient does not have concerns regarding medicines.  The following changes have been made:   Amiodarone 400 mg daily  Labs/ tests ordered today include:  Orders Placed This Encounter  Procedures  . EKG 12-Lead    Patient Instructions  KEEP YOUR APPOINTMENT WITH DR. Caryl Comes 08/03/14.  Dr. Sallyanne Kuster recommends that you schedule a follow-up  appointment in: 1ST AVAILABLE     Signed, [name], MD  07/27/2014 5:32 PM    [name], MD, [facility] [phone] office [phone] pager

## 2014-07-27 NOTE — Patient Instructions (Signed)
KEEP YOUR APPOINTMENT WITH DR. Caryl Comes 08/03/14.  Dr. Sallyanne Kuster recommends that you schedule a follow-up appointment in: 1ST AVAILABLE

## 2014-07-28 ENCOUNTER — Telehealth: Payer: Self-pay | Admitting: Cardiovascular Disease

## 2014-07-28 NOTE — Telephone Encounter (Signed)
Pt's wife called in stating that the pt woke up this morning very hoarse and wanted to know if this is a possible side effect of the Amiodarone he was just prescribed. Please call  Thanks

## 2014-07-28 NOTE — Telephone Encounter (Signed)
Returned call to patient Dr.Croitoru advised to stay on amiodarone.Advised to see PCP may need referral to ENT.

## 2014-07-28 NOTE — Telephone Encounter (Signed)
Returned call to patient he stated he is having hoarseness, has had for a long time worse today.Advised I don't think hoarseness is a side effect from amiodarone.Advised I will send message to Dr.Croitoru for advice.Advised he should see PCP he may need to be referred to ENT since he has had hoarseness before starting on amiodarone.

## 2014-08-01 ENCOUNTER — Other Ambulatory Visit: Payer: Self-pay

## 2014-08-03 ENCOUNTER — Encounter: Payer: Self-pay | Admitting: Internal Medicine

## 2014-08-03 ENCOUNTER — Ambulatory Visit (INDEPENDENT_AMBULATORY_CARE_PROVIDER_SITE_OTHER): Payer: Medicare Other | Admitting: Internal Medicine

## 2014-08-03 VITALS — BP 110/52 | HR 70 | Ht 71.0 in | Wt 180.6 lb

## 2014-08-03 DIAGNOSIS — I495 Sick sinus syndrome: Secondary | ICD-10-CM

## 2014-08-03 DIAGNOSIS — I472 Ventricular tachycardia, unspecified: Secondary | ICD-10-CM

## 2014-08-03 DIAGNOSIS — Z45018 Encounter for adjustment and management of other part of cardiac pacemaker: Secondary | ICD-10-CM | POA: Diagnosis not present

## 2014-08-03 DIAGNOSIS — I255 Ischemic cardiomyopathy: Secondary | ICD-10-CM

## 2014-08-03 DIAGNOSIS — R001 Bradycardia, unspecified: Secondary | ICD-10-CM

## 2014-08-03 LAB — CUP PACEART INCLINIC DEVICE CHECK
Battery Remaining Longevity: 52.8 mo
Battery Voltage: 2.86 V
Brady Statistic RA Percent Paced: 93 %
Brady Statistic RV Percent Paced: 37 %
Date Time Interrogation Session: 20160629120602
Lead Channel Impedance Value: 400 Ohm
Lead Channel Impedance Value: 550 Ohm
Lead Channel Pacing Threshold Amplitude: 0.625 V
Lead Channel Pacing Threshold Amplitude: 1 V
Lead Channel Pacing Threshold Pulse Width: 0.4 ms
Lead Channel Pacing Threshold Pulse Width: 0.4 ms
Lead Channel Sensing Intrinsic Amplitude: 11.3 mV
Lead Channel Sensing Intrinsic Amplitude: 3.4 mV
Lead Channel Setting Pacing Amplitude: 0.875
Lead Channel Setting Pacing Amplitude: 2.5 V
Lead Channel Setting Pacing Pulse Width: 0.4 ms
Lead Channel Setting Sensing Sensitivity: 2 mV
Pulse Gen Model: 2210
Pulse Gen Serial Number: 2173363

## 2014-08-03 NOTE — Patient Instructions (Addendum)
Medication Instructions:  Your physician recommends that you continue on your current medications as directed. Please refer to the Current Medication list given to you today.  Labwork: None ordered  Testing/Procedures: None ordered  Follow-Up: No follow up is needed at this time with Dr. Caryl Comes.  He will see you on an as needed basis.  Any Other Special Instructions Will Be Listed Below (If Applicable). Isometric exercises  Abdominal binder

## 2014-08-03 NOTE — Progress Notes (Signed)
ELECTROPHYSIOLOGY Consult NOTE  Patient ID: Adam [name], MRN: [id number], DOB/AGE: Jan 29, 1927 81 y.o. Admit date: (Not on file) Date of Consult: 08/03/2014  Primary Physician: Horatio Pel, MD Primary Cardiologist:  [facility]  Chief Complaint:VT    HPI Adam [name] is a 82 y.o. male \ seen with a history of symptomatic sustained ventricular tachycardia.  He has a history of coronary artery disease with bypass surgery 1996 with catheterization 2012 demonstrating patent grafts but occluded native vessels. He has undergone post bypass stenting in 2011.  Nuclear stress testing 2015 demonstrated inferior scar without ischemia. Ejection fraction at that time was 45%.  He has a history of a pacemaker previously implanted for sinus node dysfunction. Interrogation of his device recently demonstrated sustained ventricular tachycardia, rate around the VT monitor zone at 150 bpm.  He was started on amiodarone.side effect discussions were had and preop lab were ordered .  He has a history of orthostatic hypotension which has prompted down titration of his beta blockers    Past Medical History  Diagnosis Date  . CAD (coronary artery disease)     CABG (LIMA-LAD, SVG-RCA, SVG-OM in 1996).  07/2009 BMS to SVG-RCA. Cath in 04/2010 with patent stents   . Bradycardia     AFib/SSS s/p St Jude PPM 04/12/2008  . Hypothyroid   . Pacemaker   . Hyperlipidemia   . GERD (gastroesophageal reflux disease)   . Hiatal hernia   . Insomnia   . S/P CABG x 4   . RBBB   . Melanoma of back 1976  . Heart murmur     "just told I had one today" (12/15/2012)  . Myocardial infarction 1996; 2011    "both silent" (12/15/2012)  . Exertional shortness of breath     "sometimes walking" (12/15/2012)  . Arthritis     "minor, back and sometimes knees" (12/15/2012)  . Sustained ventricular tachycardia 07/27/2014      Surgical History:  Past Surgical History  Procedure Laterality Date  . US echocardiography   07/11/2009    EF 45-50%  . Nm myoview ltd  06/2011    low risk  . Coronary artery bypass graft  1996    LIMA to LAD,SVG to RCA & SVG to OM  . Tonsillectomy  1938  . Appendectomy  1942  . Coronary angioplasty with stent placement  07/2009    bare metal stent to SVG to the RCA  . Cardiac catheterization  04/2010    LIMA to LAD patent,SVG to OM patent,no in-stnet restenosis RCA  . Cataract extraction w/ intraocular lens  implant, bilateral Bilateral 2012  . Melanoma excision  05/1974 X2    "taken off my back" (12/15/2012)  . Transurethral resection of prostate  1986  . Insert / replace / remove pacemaker  2010     Home Meds: Prior to Admission medications   Medication Sig Start Date End Date Taking? Authorizing Provider  amiodarone (PACERONE) 200 MG tablet Take 2 tablets (400 mg total) by mouth daily. 07/26/14  Yes Mihai Croitoru, MD  aspirin EC 81 MG EC tablet Take 1 tablet (81 mg total) by mouth daily. 12/16/12  Yes Isaiah Serge, NP  clopidogrel (PLAVIX) 75 MG tablet Take 75 mg by mouth daily.   Yes Historical Provider, MD  levothyroxine (SYNTHROID, LEVOTHROID) 100 MCG tablet Take 100 mcg by mouth daily.   Yes Historical Provider, MD  NITROSTAT 0.4 MG SL tablet PLACE 1 TABLET UNDER TONGUE EVERY 5 MINUTES FOR UP TO  3 DOSES AS NEEDED FOR CHEST PAIN 07/25/14  Yes Mihai Croitoru, MD  Nutritional Supplements (VITAMIN D MAINTENANCE PO) Take 5,000 Units by mouth daily.   Yes Historical Provider, MD  pantoprazole (PROTONIX) 40 MG tablet TAKE 1 TABLET BY MOUTH TWICE A DAY 02/28/14  Yes Mihai Croitoru, MD  pravastatin (PRAVACHOL) 80 MG tablet Take 40 mg by mouth daily.   Yes Historical Provider, MD  torsemide (DEMADEX) 20 MG tablet Take 20 mg by mouth every other day.    Yes Historical Provider, MD      Allergies:  Allergies  Allergen Reactions  . Altace [Ramipril] Other (See Comments)    Cough  . Penicillins Rash  . Sulfa Antibiotics Rash    History   Social History  . Marital Status:  Married    Spouse Name: N/A  . Number of Children: N/A  . Years of Education: N/A   Occupational History  . Not on file.   Social History Main Topics  . Smoking status: Never Smoker   . Smokeless tobacco: Never Used  . Alcohol Use: No  . Drug Use: No  . Sexual Activity: No   Other Topics Concern  . Not on file   Social History Narrative     Family History  Problem Relation Age of Onset  . Coronary artery disease Mother   . Coronary artery disease Father      ROS:  Please see the history of present illness.     All other systems reviewed and negative.    Physical Exam: Blood pressure 110/52, pulse 70, height 5\' 11"  (1.803 m), weight 180 lb 9.6 oz (81.92 kg). General: Well developed, well nourished male in no acute distress. Head: Normocephalic, atraumatic, sclera non-icteric, no xanthomas, nares are without discharge. EENT: normal Lymph Nodes:  none Back: without scoliosis/kyphosis, no CVA tendersness Neck: Negative for carotid bruits. JVD not elevated. Lungs: Clear bilaterally to auscultation without wheezes, rales, or rhonchi. Breathing is unlabored. Heart: RRR with S1 S2. No  murmur , rubs, or gallops appreciated. Abdomen: Soft, non-tender, non-distended with normoactive bowel sounds. No hepatomegaly. No rebound/guarding. No obvious abdominal masses. Msk:  Strength and tone appear normal for age. Extremities: No clubbing or cyanosis. No edema.  Distal pedal pulses are 2+ and equal bilaterally. Skin: Warm and Dry Neuro: Alert and oriented X 3. CN III-XII intact Grossly normal sensory and motor function . Psych:  Responds to questions appropriately with a normal affect.      Labs: Cardiac Enzymes No results for input(s): CKTOTAL, CKMB, TROPONINI in the last 72 hours. CBC Lab Results  Component Value Date   WBC 7.4 07/14/2014   HGB 14.5 07/14/2014   HCT 43.7 07/14/2014   MCV 96.0 07/14/2014   PLT 298.0 07/14/2014   PROTIME: No results for input(s):  LABPROT, INR in the last 72 hours. Chemistry No results for input(s): NA, K, CL, CO2, BUN, CREATININE, CALCIUM, PROT, BILITOT, ALKPHOS, ALT, AST, GLUCOSE in the last 168 hours.  Invalid input(s): LABALBU Lipids Lab Results  Component Value Date   CHOL  09/19/2009    93        ATP III CLASSIFICATION:  <200     mg/dL   Desirable  200-239  mg/dL   Borderline High  >=240    mg/dL   High          HDL 34* 09/19/2009   LDLCALC  09/19/2009    39        Total Cholesterol/HDL:CHD Risk Coronary  Heart Disease Risk Table                     Men   Women  1/2 Average Risk   3.4   3.3  Average Risk       5.0   4.4  2 X Average Risk   9.6   7.1  3 X Average Risk  23.4   11.0        Use the calculated Patient Ratio above and the CHD Risk Table to determine the patient's CHD Risk.        ATP III CLASSIFICATION (LDL):  <100     mg/dL   Optimal  100-129  mg/dL   Near or Above                    Optimal  130-159  mg/dL   Borderline  160-189  mg/dL   High  >190     mg/dL   Very High   TRIG 98 09/19/2009   BNP PRO B NATRIURETIC PEPTIDE (BNP)  Date/Time Value Ref Range Status  07/12/2009 03:30 AM 145.0* 0.0 - 100.0 pg/mL Final  07/11/2009 03:20 AM 144.0* 0.0 - 100.0 pg/mL Final   Thyroid Function Tests: No results for input(s): TSH, T4TOTAL, T3FREE, THYROIDAB in the last 72 hours.  Invalid input(s): FREET3    Miscellaneous No results found for: DDIMER  Radiology/Studies:  [place] [facility]  07/21/2014   CLINICAL DATA:  Productive cough. Yellow sputum. Congestion for 1 month.  EXAM: CT PARANASAL SINUS LIMITED WITHOUT CONTRAST  TECHNIQUE: Non-contiguous multidetector CT images of the paranasal sinuses were obtained in a single plane without contrast.  COMPARISON:  12/10/2013  FINDINGS: Mastoid air cells are clear. Minimal nodular mucosal thickening within the right maxillary sinus is present. Left maxillary sinus is widely patent. Ethmoid air cells, frontal sinuses, and sphenoid  sinus are clear. No air-fluid levels. No bony destruction.  IMPRESSION: There is minimal mucosal thickening in the inferior right maxillary sinus which has a chronic appearance. No air-fluid levels or evidence of acute sinusitis.   Electronically Signed   By: Marybelle Killings M.D.   On: 07/21/2014 16:36    EKG:  Was ordered today demonstrated atrial pacing at 76 with right bundle branch block left axis deviation with a prior inferior wall infarct  Assessment and Plan:   Ischemic cardiomyopathy with prior bypass surgery  Ventricular tachycardia-sustained-monomorphic  Sinus node dysfunction  Pacemaker-St. Jude  Orthostatic intolerance  Right bundle branch block left anterior fascicular block   I agree with Dr. Thermon Leyland that amiodarone is reasonable therapy for this man was sustained ventricular tachycardia given left ventricular function, functional status and age. The AVID data I think supports this approach.  I also concur with him that probably no ischemic workup is necessary, although if rcurrent VT is noted, I would probably pursue catheterization.  I worry a little bit about the tendency of amiodarone to aggravate orthostatic intolerance.  Hence, we reviewed the role of isometric exercises and an abdominal binder in  trying to mitigate symptoms    Virl Axe

## 2014-08-11 ENCOUNTER — Other Ambulatory Visit (INDEPENDENT_AMBULATORY_CARE_PROVIDER_SITE_OTHER): Payer: Medicare Other

## 2014-08-11 ENCOUNTER — Ambulatory Visit (INDEPENDENT_AMBULATORY_CARE_PROVIDER_SITE_OTHER)
Admission: RE | Admit: 2014-08-11 | Discharge: 2014-08-11 | Disposition: A | Discharge status: Home / Self Care | Payer: Medicare Other | Source: Ambulatory Visit | Attending: Internal Medicine | Admitting: Internal Medicine

## 2014-08-11 ENCOUNTER — Encounter: Payer: Self-pay | Admitting: Internal Medicine

## 2014-08-11 ENCOUNTER — Ambulatory Visit (INDEPENDENT_AMBULATORY_CARE_PROVIDER_SITE_OTHER): Payer: Medicare Other | Admitting: Internal Medicine

## 2014-08-11 VITALS — BP 110/66 | HR 76 | Ht 71.0 in | Wt 181.0 lb

## 2014-08-11 DIAGNOSIS — R0609 Other forms of dyspnea: Secondary | ICD-10-CM

## 2014-08-11 DIAGNOSIS — R0600 Dyspnea, unspecified: Secondary | ICD-10-CM | POA: Diagnosis not present

## 2014-08-11 DIAGNOSIS — E032 Hypothyroidism due to medicaments and other exogenous substances: Secondary | ICD-10-CM

## 2014-08-11 DIAGNOSIS — R058 Other specified cough: Secondary | ICD-10-CM

## 2014-08-11 DIAGNOSIS — E039 Hypothyroidism, unspecified: Secondary | ICD-10-CM | POA: Insufficient documentation

## 2014-08-11 DIAGNOSIS — R05 Cough: Secondary | ICD-10-CM

## 2014-08-11 HISTORY — DX: Dyspnea, unspecified: R06.00

## 2014-08-11 LAB — BRAIN NATRIURETIC PEPTIDE: Pro B Natriuretic peptide (BNP): 57 pg/mL (ref 0.0–100.0)

## 2014-08-11 LAB — TSH: TSH: 5.7 u[IU]/mL — ABNORMAL HIGH (ref 0.35–4.50)

## 2014-08-11 LAB — SEDIMENTATION RATE: Sed Rate: 37 mm/hr — ABNORMAL HIGH (ref 0–22)

## 2014-08-11 NOTE — Assessment & Plan Note (Addendum)
Amiodarone started 07/26/2014   Lab Results  Component Value Date   TSH 5.70* 08/11/2014     May need synthroid adjusted ?  related to amiodarone rx/ will need f/u by primary care

## 2014-08-11 NOTE — Assessment & Plan Note (Signed)
-   08/11/2014  Walked RA x 1 laps @ 185 ft each stopped due to fatigue/off balance/ slow pace/  no sob or desat  Needs pfts for baseline since now on amiodarone.

## 2014-08-11 NOTE — Patient Instructions (Signed)
Please remember to go to the lab and x-ray department downstairs for your tests - we will call you with the results when they are available.  Please schedule a follow up office visit in 6 weeks, call sooner if needed with pfts on return

## 2014-08-11 NOTE — Assessment & Plan Note (Signed)
-   allergy profile 07/14/2014 >  Eos 0.2 / IgE 16 with neg RAST - sinus CT 07/21/2014 >>There is minimal mucosal thickening in the inferior right maxillary sinus which has a chronic appearance. No air-fluid levels or evidence of acute sinusitis - Huge HH on cxr 08/11/2014   Still strongly favor cough related to  Classic Upper airway cough syndrome, so named because it's frequently impossible to sort out how much is  CR/sinusitis with freq throat clearing (which can be related to primary GERD)   vs  causing  secondary (" extra esophageal")  GERD from wide swings in gastric pressure that occur with throat clearing, often  promoting self use of mint and menthol lozenges that reduce the lower esophageal sphincter tone and exacerbate the problem further in a cyclical fashion.   These are the same pts (now being labeled as having "irritable larynx syndrome" by some cough centers) who not infrequently have a history of having failed to tolerate ace inhibitors,  dry powder inhalers or biphosphonates or report having atypical reflux symptoms that don't respond to standard doses of PPI , and are easily confused as having aecopd or asthma flares by even experienced allergists/ pulmonologists.   rec continue max gerd rx/ diet

## 2014-08-11 NOTE — Progress Notes (Signed)
Subjective:    Patient ID: Adam [name], male    DOB: [birth year],     MRN: [id number]    Brief patient profile:  35 yowm never smoker new onset cough summer of 2015 with some bloody mucus/ turned yellow p  zpak esp in am's then bloody again x end of May  2016 > [name] referred 07/14/2014 to pulmonary clinic    History of Present Illness  07/14/2014 1st [facility] office visit/ Elveta Rape   Chief Complaint  Patient presents with  . Pulmonary Consult    Referred by [name].  Pt c/o cough for the past year or so- prod in the am with yellow sputum. He has had blood streaked sputum for the past 3 wks.  He feels weak in general and voice has changed. He also has had night sweats.   pt has baseline gerd / large HH on ppi with bfast daily but most severe coughing is before bfast, not really much worse x one year but now blood streaked- no epistaxis/ on asa and plavix  rec Please see patient coordinator before you leave today  to schedule sinus CT > neg  Pantoprazole (protonix) 40 mg   Take  30-60 min before first meal of the day and Pepcid ac (famotidine)  20 mg one @  bedtime until return to office - this is the best way to tell whether stomach acid is contributing to your problem.   GERD diet     07/26/14 started on Amiodarone    08/11/2014 f/u ov/Yousuf Ager re: chronic cough  / large HH / low grade hemoptysis on asa/plavix s epistaxis  Chief Complaint  Patient presents with  . Follow-up    Pt states that his cough has improved some since the last visit. He had minimal streaky hemoptysis x 1 wk ago. No new co's today.   cough and doe x one year, no problem at hs / on amiodarone now, Not limited by breathing from desired activities  But by fatigue at this point.   No obvious day to day or daytime variabilty or assoc  cp or chest tightness, subjective wheeze overt sinus or hb symptoms. No unusual exp hx or h/o childhood pna/ asthma or knowledge of premature birth.  Sleeping ok without  nocturnal  or early am exacerbation  of respiratory  c/o's or need for noct saba. Also denies any obvious fluctuation of symptoms with weather or environmental changes or other aggravating or alleviating factors except as outlined above   Current Medications, Allergies, Complete Past Medical History, Past Surgical History, Family History, and Social History were reviewed in [employer] record.  ROS  The following are not active complaints unless bolded sore throat, dysphagia, dental problems, itching, sneezing,  nasal congestion or excess/ purulent secretions, ear ache,   fever, chills, sweats, unintended wt loss, pleuritic or exertional cp, hemoptysis,  orthopnea pnd or leg swelling, presyncope, palpitations, abdominal pain, anorexia, nausea, vomiting, diarrhea  or change in bowel or urinary habits, change in stools or urine, dysuria,hematuria,  rash, arthralgias, visual complaints, headache, numbness weakness or ataxia or problems with walking or coordination,  change in mood/affect or memory.               Objective:   Physical Exam   amb wm nad / mild nasal tone to voice   08/11/2014          181  Wt Readings from Last 3 Encounters:  07/14/14 180  lb (81.647 kg)  01/29/14 187 lb 3.2 oz (84.913 kg)  10/12/13 182 lb 1.6 oz (82.6 kg)    Vital signs reviewed  HEENT: nl dentition, turbinates, and orophanx. Nl external ear canals without cough reflex   NECK :  without JVD/Nodes/TM/ nl carotid upstrokes bilaterally   LUNGS: no acc muscle use, clear to A and P bilaterally without cough on insp or exp maneuvers   CV:  RRR  no s3   II/VI sem / no increase in P2, no edema   ABD:  soft and nontender with nl excursion in the supine position. No bruits or organomegaly, bowel sounds nl  MS:  warm without deformities, calf tenderness, cyanosis or clubbing  SKIN: warm and dry without lesions    NEURO:  alert, approp, no deficits   I personally reviewed images and  agree with radiology impression as follows:  CXR:   08/11/2014  1. Mild infiltrate right lower lobe. 2. Prior CABG. Cardiac pacer noted good anatomic position. Cardiomegaly, no CHF. 3. Prominent hiatal hernia  . Marland Kitchen Labs ordered/ reviewed:     Chemistry      Component Value Date/Time   NA 140 07/26/2014 1359   K 4.8 07/26/2014 1359   CL 102 07/26/2014 1359   CO2 28 07/26/2014 1359   BUN 17 07/26/2014 1359   CREATININE 0.81 07/26/2014 1359   CREATININE 0.75 02/01/2014 0433      Component Value Date/Time   CALCIUM 9.3 07/26/2014 1359   ALKPHOS 58 01/29/2014 2056   AST 32 01/29/2014 2056   ALT 17 01/29/2014 2056   BILITOT 0.7 01/29/2014 2056       Lab Results  Component Value Date   WBC 7.4 07/14/2014   HGB 14.5 07/14/2014   HCT 43.7 07/14/2014   MCV 96.0 07/14/2014   PLT 298.0 07/14/2014    Lab Results  Component Value Date   TSH 5.70* 08/11/2014     Lab Results  Component Value Date   PROBNP 57.0 08/11/2014     Lab Results  Component Value Date   ESRSEDRATE 37* 08/11/2014           Assessment & Plan:

## 2014-08-12 ENCOUNTER — Telehealth: Payer: Self-pay | Admitting: Internal Medicine

## 2014-08-12 ENCOUNTER — Encounter: Payer: Self-pay | Admitting: Cardiovascular Disease

## 2014-08-12 NOTE — Progress Notes (Signed)
Quick Note:  ATC both numbers, NA and no option to leave a msg, WCB ______

## 2014-08-12 NOTE — Telephone Encounter (Signed)
Patient notified of lab/CXR results. Nothing further needed.

## 2014-09-06 ENCOUNTER — Ambulatory Visit (INDEPENDENT_AMBULATORY_CARE_PROVIDER_SITE_OTHER): Payer: Medicare Other | Admitting: Cardiovascular Disease

## 2014-09-06 ENCOUNTER — Encounter: Payer: Self-pay | Admitting: Cardiovascular Disease

## 2014-09-06 VITALS — BP 122/70 | HR 72 | Resp 20 | Ht 68.0 in | Wt 183.0 lb

## 2014-09-06 DIAGNOSIS — Z950 Presence of cardiac pacemaker: Secondary | ICD-10-CM

## 2014-09-06 DIAGNOSIS — R001 Bradycardia, unspecified: Secondary | ICD-10-CM | POA: Diagnosis not present

## 2014-09-06 DIAGNOSIS — I2510 Atherosclerotic heart disease of native coronary artery without angina pectoris: Secondary | ICD-10-CM

## 2014-09-06 DIAGNOSIS — I472 Ventricular tachycardia, unspecified: Secondary | ICD-10-CM

## 2014-09-06 DIAGNOSIS — Z79899 Other long term (current) drug therapy: Secondary | ICD-10-CM

## 2014-09-06 DIAGNOSIS — E785 Hyperlipidemia, unspecified: Secondary | ICD-10-CM

## 2014-09-06 DIAGNOSIS — I495 Sick sinus syndrome: Secondary | ICD-10-CM | POA: Diagnosis not present

## 2014-09-06 DIAGNOSIS — I255 Ischemic cardiomyopathy: Secondary | ICD-10-CM

## 2014-09-06 LAB — CUP PACEART INCLINIC DEVICE CHECK
Battery Remaining Longevity: 50.4 mo
Battery Voltage: 2.86 V
Brady Statistic RA Percent Paced: 98 %
Brady Statistic RV Percent Paced: 56 %
Date Time Interrogation Session: 20160802130442
Lead Channel Impedance Value: 400 Ohm
Lead Channel Impedance Value: 525 Ohm
Lead Channel Pacing Threshold Amplitude: 0.75 V
Lead Channel Pacing Threshold Amplitude: 0.75 V
Lead Channel Pacing Threshold Amplitude: 0.75 V
Lead Channel Pacing Threshold Pulse Width: 0.4 ms
Lead Channel Pacing Threshold Pulse Width: 0.4 ms
Lead Channel Pacing Threshold Pulse Width: 0.4 ms
Lead Channel Sensing Intrinsic Amplitude: 10.3 mV
Lead Channel Sensing Intrinsic Amplitude: 2.7 mV
Lead Channel Setting Pacing Amplitude: 1 V
Lead Channel Setting Pacing Amplitude: 2.5 V
Lead Channel Setting Pacing Pulse Width: 0.4 ms
Lead Channel Setting Sensing Sensitivity: 2 mV
Pulse Gen Model: 2210
Pulse Gen Serial Number: 2173363

## 2014-09-06 LAB — COMPLETE METABOLIC PANEL WITH GFR
ALT: 16 U/L (ref 9–46)
AST: 18 U/L (ref 10–35)
Albumin: 3.5 g/dL — ABNORMAL LOW (ref 3.6–5.1)
Alkaline Phosphatase: 60 U/L (ref 40–115)
BUN: 18 mg/dL (ref 7–25)
CO2: 26 mmol/L (ref 20–31)
Calcium: 8.9 mg/dL (ref 8.6–10.3)
Chloride: 103 mmol/L (ref 98–110)
Creat: 0.93 mg/dL (ref 0.70–1.11)
GFR, Est African American: 86 mL/min (ref >=60)
GFR, Est Non African American: 75 mL/min (ref >=60)
Glucose, Bld: 75 mg/dL (ref 65–99)
Potassium: 4.4 mmol/L (ref 3.5–5.3)
Sodium: 138 mmol/L (ref 135–146)
Total Bilirubin: 0.5 mg/dL (ref 0.2–1.2)
Total Protein: 6.6 g/dL (ref 6.1–8.1)

## 2014-09-06 LAB — TSH: TSH: 3.82 u[IU]/mL (ref 0.350–4.500)

## 2014-09-06 NOTE — Progress Notes (Signed)
Patient ID: Adam [name], male   DOB: October 17, 1926, 81 y.o.   MRN: [id number]     Cardiology Office Note   Date:  09/06/2014   ID:  [name], DOB [birth year], MRN [id number]  PCP:  Horatio Pel, MD  Cardiologist:   [name], MD   Chief Complaint  Patient presents with  . Follow-up  . Edema    little in the ankle      History of Present Illness: Adam [name] is a 81 y.o. male who presents for f/u of sustained VT after initiation of amiodarone.  Adam [name] is a 81 y.o. male who presents for sustained symptomatic ventricular tachycardia.   He has long-standing coronary artery disease and underwent bypass surgery 1996. He had placement of a stent to the SVG to RCA in 2011. His last cardiac catheterization in 2012 show that vessel to be widely patent but all his native coronary arteries are occluded and he is graft dependent. He is nuclear stress test in December 2015 showed an extensive inferior wall scar without any areas of ischemia. He has had problems with hypotension and is sensitive to diuretic therapy. Beta blockers have been discontinued and loop diuretics have to be used judiciously. He has been hospitalized for intravenous fluid administration not long ago. He has mild ischemic cardiomyopathy with a left ventricular ejection fraction estimated at about 45% and has not had a lot of problems with heart failure decompensation  Last month he expressed rapid palpitations associated with dizziness/near syncope, shortness of breath and extreme fatigue associated with sustained VT, monomorphic, recorded on his pacemaker.  Over the last several years he has had progressive worsening of orthostatic hypotension and his beta blockers have been gradually weaned until they have been completely discontinued. Has not tolerated even tiny doses of beta blocker. He has treated hypothyroidism and hyperlipidemia.  Pacemaker check today shows no further arrhythmia. Generator  longevity is estimated to be 4.2 years. There is 98% atrial pacing and 56% ventricular pacing.    Past Medical History  Diagnosis Date  . CAD (coronary artery disease)     CABG (LIMA-LAD, SVG-RCA, SVG-OM in 1996).  07/2009 BMS to SVG-RCA. Cath in 04/2010 with patent stents   . Bradycardia     AFib/SSS s/p St Jude PPM 04/12/2008  . Hypothyroid   . Pacemaker   . Hyperlipidemia   . GERD (gastroesophageal reflux disease)   . Hiatal hernia   . Insomnia   . S/P CABG x 4   . RBBB   . Melanoma of back 1976  . Heart murmur     "just told I had one today" (12/15/2012)  . Myocardial infarction 1996; 2011    "both silent" (12/15/2012)  . Exertional shortness of breath     "sometimes walking" (12/15/2012)  . Arthritis     "minor, back and sometimes knees" (12/15/2012)  . Sustained ventricular tachycardia 07/27/2014    Past Surgical History  Procedure Laterality Date  . US echocardiography  07/11/2009    EF 45-50%  . Nm myoview ltd  06/2011    low risk  . Coronary artery bypass graft  1996    LIMA to LAD,SVG to RCA & SVG to OM  . Tonsillectomy  1938  . Appendectomy  1942  . Coronary angioplasty with stent placement  07/2009    bare metal stent to SVG to the RCA  . Cardiac catheterization  04/2010    LIMA to LAD patent,SVG to OM patent,no in-stnet restenosis  RCA  . Cataract extraction w/ intraocular lens  implant, bilateral Bilateral 2012  . Melanoma excision  05/1974 X2    "taken off my back" (12/15/2012)  . Transurethral resection of prostate  1986  . Insert / replace / remove pacemaker  2010     Current Outpatient Prescriptions  Medication Sig Dispense Refill  . amiodarone (PACERONE) 200 MG tablet Take 2 tablets (400 mg total) by mouth daily. 60 tablet 3  . aspirin EC 81 MG EC tablet Take 1 tablet (81 mg total) by mouth daily.    . clopidogrel (PLAVIX) 75 MG tablet Take 75 mg by mouth daily.    . famotidine (PEPCID) 20 MG tablet Take 20 mg by mouth at bedtime.    Marland Kitchen levothyroxine  (SYNTHROID, LEVOTHROID) 100 MCG tablet Take 100 mcg by mouth daily.    Marland Kitchen NITROSTAT 0.4 MG SL tablet PLACE 1 TABLET UNDER TONGUE EVERY 5 MINUTES FOR UP TO 3 DOSES AS NEEDED FOR CHEST PAIN 25 tablet 3  . Nutritional Supplements (VITAMIN D MAINTENANCE PO) Take 5,000 Units by mouth daily.    . pantoprazole (PROTONIX) 40 MG tablet TAKE 1 TABLET BY MOUTH TWICE A DAY 60 tablet 8  . pravastatin (PRAVACHOL) 80 MG tablet Take 40 mg by mouth daily.    Marland Kitchen torsemide (DEMADEX) 20 MG tablet Take 20 mg by mouth every other day.      No current facility-administered medications for this visit.    Allergies:   Altace; Penicillins; and Sulfa antibiotics    Social History:  The patient  reports that he has never smoked. He has never used smokeless tobacco. He reports that he does not drink alcohol or use illicit drugs.   Family History:  The patient's family history includes Coronary artery disease in his father and mother.    ROS:  Please see the history of present illness.    Otherwise, review of systems positive for none.   All other systems are reviewed and negative.    PHYSICAL EXAM: VS:  BP 122/70 mmHg  Pulse 72  Resp 20  Ht 5\' 8"  (1.727 m)  Wt 183 lb (83.008 kg)  BMI 27.83 kg/m2 , BMI Body mass index is 27.83 kg/(m^2).  General: Alert, oriented x3, no distress Head: no evidence of trauma, PERRL, EOMI, no exophtalmos or lid lag, no myxedema, no xanthelasma; normal ears, nose and oropharynx Neck: normal jugular venous pulsations and no hepatojugular reflux; brisk carotid pulses without delay and no carotid bruits Chest: clear to auscultation, no signs of consolidation by percussion or palpation, normal fremitus, symmetrical and full respiratory excursions Cardiovascular: normal position and quality of the apical impulse, regular rhythm, normal first and second heart sounds, 2/6 holosystolic apical murmur, rubs or gallops Abdomen: no tenderness or distention, no masses by palpation, no abnormal  pulsatility or arterial bruits, normal bowel sounds, no hepatosplenomegaly Extremities: no clubbing, cyanosis or edema; 2+ radial, ulnar and brachial pulses bilaterally; 2+ right femoral, posterior tibial and dorsalis pedis pulses; 2+ left femoral, posterior tibial and dorsalis pedis pulses; no subclavian or femoral bruits Neurological: grossly nonfocal Psych: euthymic mood, full affect   EKG:  EKG is ordered today. The ekg ordered today demonstrates atrial paced, ventricular sensed rhythm, right bundle branch block and left anterior fascicular block, QRS 180 ms, QTC 508 ms   Recent Labs: 01/29/2014: ALT 17; B Natriuretic Peptide 50.1 07/14/2014: Hemoglobin 14.5; Platelets 298.0 07/26/2014: BUN 17; Creat 0.81; Magnesium 2.4; Potassium 4.8; Sodium 140 08/11/2014: Pro B Natriuretic peptide (  BNP) 57.0; TSH 5.70*    Lipid Panel    Component Value Date/Time   CHOL  09/19/2009 0354    93        ATP III CLASSIFICATION:  <200     mg/dL   Desirable  200-239  mg/dL   Borderline High  >=240    mg/dL   High          TRIG 98 09/19/2009 0354   HDL 34* 09/19/2009 0354   CHOLHDL 2.7 09/19/2009 0354   VLDL 20 09/19/2009 0354   LDLCALC  09/19/2009 0354    39        Total Cholesterol/HDL:CHD Risk Coronary Heart Disease Risk Table                     Men   Women  1/2 Average Risk   3.4   3.3  Average Risk       5.0   4.4  2 X Average Risk   9.6   7.1  3 X Average Risk  23.4   11.0        Use the calculated Patient Ratio above and the CHD Risk Table to determine the patient's CHD Risk.        ATP III CLASSIFICATION (LDL):  <100     mg/dL   Optimal  100-129  mg/dL   Near or Above                    Optimal  130-159  mg/dL   Borderline  160-189  mg/dL   High  >190     mg/dL   Very High      Wt Readings from Last 3 Encounters:  09/06/14 183 lb (83.008 kg)  08/11/14 181 lb (82.101 kg)  08/03/14 180 lb 9.6 oz (81.92 kg)      ASSESSMENT AND PLAN:  1. Sustained symptomatic ventricular  tachycardia - This was relatively slow at 150 bpm , monomorphic and most likely represents inferior wall scar VT. since amiodarone initiation, no further arrhythmia. Dr. Caryl Comes agreed that this was a reasonable approach. Again, discussed potential side effects of amiodarone, the need for periodic liver function tests, thyroid function tests and ophthalmological exam , protection from sun exposure and possibility of lung injury that can be very serious. He had pulmonary function tests performed in June 2015 that we can use as baseline for future assessment.  2.CAD (coronary artery disease) He has occluded native arteries and is graft dependent (LIMA to LAD, SVG to RCA, SVG to OM; status post stent in SVG to RCA 2011, patent by cath March 2012). November 2014 nuclear study shows inferior scar without ischemia, EF 41%. It is possible that there has been interval occlusion of his saphenous vein graft to the right coronary artery in the last 4 years  3. Cardiomyopathy, ischemic NYHA functional class II status, mild intermittent hypervolemia, primarily manifest as ankle edema. He does not tolerate ACE inhibitors due to cough. Angiotensin receptor blockers are considered but felt to be contraindicated because of his tendency to symptomatic hypotension. Beta blockers have been gradually curtailed due to symptomatic orthostatic hypotension.  4. Pacemaker Normal device function. No permanent reprogramming changes were necessary today. High-frequency ventricular pacing persists and we have been unable to mitigate this with any type of reprogramming strategy. Biventricular pacing does not appear justified at this point in this 81 year old gentleman with class II symptoms.    Current medicines are reviewed at length with  the patient today.  The patient does not have concerns regarding medicines.  The following changes have been made:  no change  Labs/ tests ordered today include:  Orders Placed This  Encounter  Procedures  . TSH  . COMPLETE METABOLIC PANEL WITH GFR  . Implantable device check  . EKG 12-Lead      Patient Instructions  Your physician wants you to follow-up in: 6 Month. You will receive a reminder letter in the mail two months in advance. If you don't receive a letter, please call our office to schedule the follow-up appointment.  Your physician recommends that you return for lab work in: [place] and TSH       Mikael Spray, MD  09/06/2014 3:35 PM    [name], MD, [facility] [phone] office [phone] pager

## 2014-09-06 NOTE — Patient Instructions (Signed)
Your physician wants you to follow-up in: 6 Month. You will receive a reminder letter in the mail two months in advance. If you don't receive a letter, please call our office to schedule the follow-up appointment.  Your physician recommends that you return for lab work in: [place] and TSH

## 2014-09-08 ENCOUNTER — Other Ambulatory Visit: Payer: Self-pay | Admitting: *Deleted

## 2014-09-08 DIAGNOSIS — E039 Hypothyroidism, unspecified: Secondary | ICD-10-CM

## 2014-09-26 ENCOUNTER — Ambulatory Visit (INDEPENDENT_AMBULATORY_CARE_PROVIDER_SITE_OTHER): Payer: Medicare Other | Admitting: Internal Medicine

## 2014-09-26 ENCOUNTER — Encounter: Payer: Self-pay | Admitting: Internal Medicine

## 2014-09-26 VITALS — BP 128/72 | HR 69 | Ht 70.0 in | Wt 180.0 lb

## 2014-09-26 DIAGNOSIS — R058 Other specified cough: Secondary | ICD-10-CM

## 2014-09-26 DIAGNOSIS — R05 Cough: Secondary | ICD-10-CM | POA: Diagnosis not present

## 2014-09-26 DIAGNOSIS — R0600 Dyspnea, unspecified: Secondary | ICD-10-CM | POA: Diagnosis not present

## 2014-09-26 LAB — PULMONARY FUNCTION TEST
DL/VA % pred: 93 %
DL/VA: 4.26 ml/min/mmHg/L
DLCO unc % pred: 67 %
DLCO unc: 21.73 ml/min/mmHg
FEF 25-75 Post: 1.82 L/sec
FEF 25-75 Pre: 2.18 L/sec
FEF2575-%Change-Post: -16 %
FEF2575-%Pred-Post: 107 %
FEF2575-%Pred-Pre: 129 %
FEV1-%Change-Post: -3 %
FEV1-%Pred-Post: 85 %
FEV1-%Pred-Pre: 89 %
FEV1-Post: 2.26 L
FEV1-Pre: 2.35 L
FEV1FVC-%Change-Post: -3 %
FEV1FVC-%Pred-Pre: 113 %
FEV6-%Change-Post: 0 %
FEV6-%Pred-Post: 84 %
FEV6-%Pred-Pre: 84 %
FEV6-Post: 2.96 L
FEV6-Pre: 2.96 L
FEV6FVC-%Pred-Post: 107 %
FEV6FVC-%Pred-Pre: 107 %
FVC-%Change-Post: 0 %
FVC-%Pred-Post: 78 %
FVC-%Pred-Pre: 78 %
FVC-Post: 2.96 L
FVC-Pre: 2.96 L
Post FEV1/FVC ratio: 76 %
Post FEV6/FVC ratio: 100 %
Pre FEV1/FVC ratio: 79 %
Pre FEV6/FVC Ratio: 100 %

## 2014-09-26 NOTE — Progress Notes (Signed)
Subjective:    Patient ID: Adam [name], male    DOB: 1926/02/24,     MRN: [id number]    Brief patient profile:  8 yowm never smoker new onset cough summer of 2015 with some bloody mucus/ turned yellow p  zpak esp in am's then bloody again x end of May  2016 > [name] referred 07/14/2014 to pulmonary clinic    History of Present Illness  07/14/2014 1st [facility] office visit/ Adam [name]   Chief Complaint  Patient presents with  . Pulmonary Consult    Referred by [name].  Pt c/o cough for the past year or so- prod in the am with yellow sputum. He has had blood streaked sputum for the past 3 wks.  He feels weak in general and voice has changed. He also has had night sweats.   pt has baseline gerd / large HH on ppi with bfast daily but most severe coughing is before bfast, not really much worse x one year but now blood streaked- no epistaxis/ on asa and plavix  rec Please see patient coordinator before you leave today  to schedule sinus CT > neg  Pantoprazole (protonix) 40 mg   Take  30-60 min before first meal of the day and Pepcid ac (famotidine)  20 mg one @  bedtime until return to office - this is the best way to tell whether stomach acid is contributing to your problem.   GERD diet     07/26/14 started on Amiodarone    08/11/2014 f/u ov/Adam [name] re: chronic cough  / large HH / low grade hemoptysis on asa/plavix s epistaxis  Chief Complaint  Patient presents with  . Follow-up    Pt states that his cough has improved some since the last visit. He had minimal streaky hemoptysis x 1 wk ago. No new co's today.   cough and doe x one year, no problem at hs / on amiodarone now, Not limited by breathing from desired activities  But by fatigue at this point.  rec No change rx   09/26/2014 f/u ov/Adam [name] re: poor ex tol more fatigyue than sob, some overt hb/cough esp hs  Chief Complaint  Patient presents with  . Follow-up    review PFT.  pt notes difficulty clearing throat, prod cough  with dark yellow mucus-worse qhs.      not on resp rx/ admits not consistent with diet restrictions for gerd  No obvious day to day or daytime variabilty or assoc  cp or chest tightness, subjective wheeze overt sinus or hb symptoms. No unusual exp hx or h/o childhood pna/ asthma or knowledge of premature birth.    Also denies any obvious fluctuation of symptoms with weather or environmental changes or other aggravating or alleviating factors except as outlined above   Current Medications, Allergies, Complete Past Medical History, Past Surgical History, Family History, and Social History were reviewed in [employer] record.  ROS  The following are not active complaints unless bolded sore throat, dysphagia, dental problems, itching, sneezing,  nasal congestion or excess/ purulent secretions, ear ache,   fever, chills, sweats, unintended wt loss, pleuritic or exertional cp, hemoptysis,  orthopnea pnd or leg swelling, presyncope, palpitations, abdominal pain, anorexia, nausea, vomiting, diarrhea  or change in bowel or urinary habits, change in stools or urine, dysuria,hematuria,  rash, arthralgias, visual complaints, headache, numbness weakness or ataxia or problems with walking or coordination,  change in mood/affect or memory.  Objective:   Physical Exam   amb wm nad / mild voice fatigue     08/11/2014          181 > 09/26/2014    180  Wt Readings from Last 3 Encounters:  07/14/14 180 lb (81.647 kg)  01/29/14 187 lb 3.2 oz (84.913 kg)  10/12/13 182 lb 1.6 oz (82.6 kg)    Vital signs reviewed  HEENT: nl dentition, turbinates, and orophanx. Nl external ear canals without cough reflex   NECK :  without JVD/Nodes/TM/ nl carotid upstrokes bilaterally   LUNGS: no acc muscle use, clear to A and P bilaterally without cough on insp or exp maneuvers   CV:  RRR  no s3   II-III/VI SEM / no increase in P2, no edema   ABD:  soft and nontender with nl  excursion in the supine position. No bruits or organomegaly, bowel sounds nl  MS:  warm without deformities, calf tenderness, cyanosis or clubbing  SKIN: warm and dry without lesions    NEURO:  alert, approp, no deficits   I personally reviewed images and agree with radiology impression as follows:  CXR:   08/11/2014  1. Mild infiltrate right lower lobe. 2. Prior CABG. Cardiac pacer noted good anatomic position. Cardiomegaly, no CHF. 3. Prominent hiatal hernia  . Marland Kitchen Labs ordered/ reviewed:     Chemistry      Component Value Date/Time   NA 140 07/26/2014 1359   K 4.8 07/26/2014 1359   CL 102 07/26/2014 1359   CO2 28 07/26/2014 1359   BUN 17 07/26/2014 1359   CREATININE 0.81 07/26/2014 1359   CREATININE 0.75 02/01/2014 0433      Component Value Date/Time   CALCIUM 9.3 07/26/2014 1359   ALKPHOS 58 01/29/2014 2056   AST 32 01/29/2014 2056   ALT 17 01/29/2014 2056   BILITOT 0.7 01/29/2014 2056       Lab Results  Component Value Date   WBC 7.4 07/14/2014   HGB 14.5 07/14/2014   HCT 43.7 07/14/2014   MCV 96.0 07/14/2014   PLT 298.0 07/14/2014    Lab Results  Component Value Date   TSH 5.70* 08/11/2014     Lab Results  Component Value Date   PROBNP 57.0 08/11/2014     Lab Results  Component Value Date   ESRSEDRATE 37* 08/11/2014           Assessment & Plan:

## 2014-09-26 NOTE — Progress Notes (Signed)
PFT done today. Adam [name],CMA  

## 2014-09-26 NOTE — Assessment & Plan Note (Signed)
-   08/11/2014  Walked RA x 1 laps @ 185 ft each stopped due to fatigue/off balance/ slow pace/  no sob or desat  - PFT's  09/26/2014  FEV1 2.26 (85 % ) ratio 76  p no % improvement from saba with DLCO  67 % corrects to 93 % for alv volume      Since prev study 08/04/13 minimal change lung vol or dlco   Concerned about use of amio in this setting but really not limited by doe so ok to continue rx and f/u pfts q 6 m for now

## 2014-09-26 NOTE — Assessment & Plan Note (Signed)
-   allergy profile 07/14/2014 >  Eos 0.2 / IgE 16 with neg RAST - sinus CT 07/21/2014 >>There is minimal mucosal thickening in the inferior right maxillary sinus which has a chronic appearance. No air-fluid levels or evidence of acute sinusitis - Huge HH on cxr 08/11/2014   High risk recurrent gerd and aspiration in this setting/ diet is key here  I had an extended discussion with the patient reviewing all relevant studies completed to date and  lasting 15 to 20 minutes of a 25 minute visit    Each maintenance medication was reviewed in detail including most importantly the difference between maintenance and prns and under what circumstances the prns are to be triggered using an action plan format that is not reflected in the computer generated alphabetically organized AVS.    Please see instructions for details which were reviewed in writing and the patient given a copy highlighting the part that I personally wrote and discussed at today's ov.

## 2014-09-26 NOTE — Patient Instructions (Addendum)
No change in medications for now  GERD (REFLUX)  is an extremely common cause of respiratory symptoms just like yours , many times with no obvious heartburn at all.    It can be treated with medication, but also with lifestyle changes including elevation of the head of your bed (ideally with 6 inch  bed blocks),  Smoking cessation, avoidance of late meals, excessive alcohol, and avoid fatty foods, chocolate, peppermint, colas, red wine, and acidic juices such as orange juice.  NO MINT OR MENTHOL PRODUCTS SO NO COUGH DROPS  USE SUGARLESS CANDY INSTEAD (Jolley ranchers or Stover's or Life Savers) or even ice chips will also do - the key is to swallow to prevent all throat clearing. NO OIL BASED VITAMINS - use powdered substitutes.   Please schedule a follow up visit in 6 months but call sooner if needed with pfts on return

## 2014-09-28 ENCOUNTER — Encounter: Payer: Self-pay | Admitting: Cardiovascular Disease

## 2014-11-22 ENCOUNTER — Ambulatory Visit (INDEPENDENT_AMBULATORY_CARE_PROVIDER_SITE_OTHER): Payer: Medicare Other | Admitting: Neurology

## 2014-11-22 ENCOUNTER — Encounter: Payer: Self-pay | Admitting: Neurology

## 2014-11-22 VITALS — BP 114/62 | HR 76 | Resp 16 | Ht 68.0 in | Wt 180.0 lb

## 2014-11-22 DIAGNOSIS — R42 Dizziness and giddiness: Secondary | ICD-10-CM

## 2014-11-22 DIAGNOSIS — G4752 REM sleep behavior disorder: Secondary | ICD-10-CM

## 2014-11-22 DIAGNOSIS — G2581 Restless legs syndrome: Secondary | ICD-10-CM

## 2014-11-22 DIAGNOSIS — R293 Abnormal posture: Secondary | ICD-10-CM

## 2014-11-22 DIAGNOSIS — R600 Localized edema: Secondary | ICD-10-CM | POA: Diagnosis not present

## 2014-11-22 NOTE — Progress Notes (Addendum)
Subjective:    Patient ID: Adam [name] is a 81 y.o. male.  HPI     Star Age, MD, PhD [facility] [address], [address] [place], [place] [address]  Dear Dr. Shelia Media,   I saw your patient, Adam [name], upon your kind request in my neurologic clinic today for initial consultation of his dizziness and balance problems. The patient is accompanied by his wife and his daughter, Caryl Asp (only living child) today. As you know, Mr. Adam [name] is a 81 year old right-handed gentleman with an underlying complex medical history of hypothyroidism, melanoma in 1976, heart disease, status post CABG in 1996, 4 vessels, and pacemaker placement secondary to bradycardia in 2010, hypertension, hyperlipidemia, reflux disease, insomnia, overweight state and restless leg symptoms, who reports problems with dizziness and his balance. This has been ongoing for years, worse in the last 4 months. He has difficulty sleeping. He has restless leg symptoms. He has been on ropinirole low dose for the past 6 months or so. This was recently increased to 0.5 mg strength 1-2 pills at night. He has been taking 1-1/2 pills for the past couple of weeks. He has been on trazodone for the past month. He has a fever year history of dream activity including yelling out in his sleep and he has even rolled out of bed and hit his head against the night table before. Trazodone helps him sleep but his wife feels that his yelling out episodes have become a little bit more frequent since he has been on trazodone. This is 50 mg each night and he has been on it for a month or so. He is not sure if he has had his vitamin B12 level and vitamin D levels checked recently. He is due for a TSH next month through your office. I reviewed your office note from 11/11/2014 which you kindly included. He sees Dr. Melvyn Novas in pulmonology and had a recent CT sinuses which was negative for any acute sinusitis. He sees cardiology, Dr. Marland KitchenC". He  had a head CT without contrast on 12/10/2013 which showed atrophy and small vessel disease. I personally reviewed the images through the PACS system and also shared some pictures with the patient and his family. He had no significant white matter changes but atrophy may have been a little bit more advanced than expected for age in my opinion. He reports a sensation of lightheadedness and insecurity when standing up. He feels like he might fall and has had some near falls but thankfully no actual falls. He denies any spinning sensation but sometimes he feels like something is spinning in his head. He denies any headaches. He does not snore or have any significant issues with breathing at night. In the past he has tried melatonin for sleep which was marginally helpful. He has had occasional tingling in his fingertips. He is not diabetic. He does not always drink enough water, his family feels. Of note, he has been on amiodarone. He has had lower extremity swelling for the past several years, essentially since his open heart surgery but in the past 6 months his swelling has become significantly worse per wife.  His Past Medical History Is Significant For: Past Medical History  Diagnosis Date  . CAD (coronary artery disease)     CABG (LIMA-LAD, SVG-RCA, SVG-OM in 1996).  07/2009 BMS to SVG-RCA. Cath in 04/2010 with patent stents   . Bradycardia     AFib/SSS s/p St Jude PPM 04/12/2008  . Hypothyroid   .  Pacemaker   . Hyperlipidemia   . GERD (gastroesophageal reflux disease)   . Hiatal hernia   . Insomnia   . S/P CABG x 4   . RBBB   . Melanoma of back ([place]) 1976  . Heart murmur     "just told I had one today" (12/15/2012)  . Myocardial infarction [facility]) 1996; 2011    "both silent" (12/15/2012)  . Exertional shortness of breath     "sometimes walking" (12/15/2012)  . Arthritis     "minor, back and sometimes knees" (12/15/2012)  . Sustained ventricular tachycardia ([place]) 07/27/2014  . Hypertension   .  [place] spotted fever     His Past Surgical History Is Significant For: Past Surgical History  Procedure Laterality Date  . US echocardiography  07/11/2009    EF 45-50%  . Nm myoview ltd  06/2011    low risk  . Coronary artery bypass graft  1996    LIMA to LAD,SVG to RCA & SVG to OM  . Tonsillectomy  1938  . Appendectomy  1942  . Coronary angioplasty with stent placement  07/2009    bare metal stent to SVG to the RCA  . Cardiac catheterization  04/2010    LIMA to LAD patent,SVG to OM patent,no in-stnet restenosis RCA  . Cataract extraction w/ intraocular lens  implant, bilateral Bilateral 2012  . Melanoma excision  05/1974 X2    "taken off my back" (12/15/2012)  . Transurethral resection of prostate  1986  . Insert / replace / remove pacemaker  2010    His Family History Is Significant For: Family History  Problem Relation Age of Onset  . Coronary artery disease Mother   . Diabetes Mother   . Heart disease Mother   . Coronary artery disease Father   . Diabetes Father   . Lung cancer Father   . Heart disease Father     His Social History Is Significant For: Social History   Social History  . Marital Status: Married    Spouse Name: N/A  . Number of Children: 2  . Years of Education: Masters   Occupational History  . Retired    Social History Main Topics  . Smoking status: Never Smoker   . Smokeless tobacco: Never Used  . Alcohol Use: No  . Drug Use: No  . Sexual Activity: No   Other Topics Concern  . None   Social History Narrative   Occasionally drinks sweet tea     His Allergies Are:  Allergies  Allergen Reactions  . Bactrim [Sulfamethoxazole-Trimethoprim]   . Crestor [Rosuvastatin Calcium]   . Altace [Ramipril] Other (See Comments)    Cough  . Penicillins Rash  . Sulfa Antibiotics Rash  :   His Current Medications Are:  Outpatient Encounter Prescriptions as of 11/22/2014  Medication Sig  . amiodarone (PACERONE) 200 MG tablet Take 2  tablets (400 mg total) by mouth daily.  Marland Kitchen aspirin EC 81 MG EC tablet Take 1 tablet (81 mg total) by mouth daily.  . Cholecalciferol (VITAMIN D3) 5000 UNITS CAPS Take by mouth.  . clobetasol ointment (TEMOVATE) 1.30 % Apply 1 application topically 2 (two) times daily.  . clopidogrel (PLAVIX) 75 MG tablet Take 75 mg by mouth daily.  . famotidine (PEPCID) 20 MG tablet Take 20 mg by mouth at bedtime.  Marland Kitchen levothyroxine (SYNTHROID, LEVOTHROID) 100 MCG tablet Take 100 mcg by mouth daily.  . pantoprazole (PROTONIX) 40 MG tablet TAKE 1 TABLET BY MOUTH TWICE  A DAY  . pravastatin (PRAVACHOL) 40 MG tablet Take 40 mg by mouth daily.  Marland Kitchen rOPINIRole (REQUIP) 0.25 MG tablet Take 0.25-0.5 mg by mouth at bedtime.  . torsemide (DEMADEX) 20 MG tablet Take 20 mg by mouth every other day.   . traZODone (DESYREL) 50 MG tablet Take 50 mg by mouth at bedtime.  . [DISCONTINUED] NITROSTAT 0.4 MG SL tablet PLACE 1 TABLET UNDER TONGUE EVERY 5 MINUTES FOR UP TO 3 DOSES AS NEEDED FOR CHEST PAIN  . [DISCONTINUED] Nutritional Supplements (VITAMIN D MAINTENANCE PO) Take 5,000 Units by mouth daily.  . [DISCONTINUED] pravastatin (PRAVACHOL) 80 MG tablet Take 40 mg by mouth daily.  . [DISCONTINUED] tamsulosin (FLOMAX) 0.4 MG CAPS capsule Take 0.4 mg by mouth daily.   No facility-administered encounter medications on file as of 11/22/2014.  :   Review of Systems:  Out of a complete 14 point review of systems, all are reviewed and negative with the exception of these symptoms as listed below:   Review of Systems  HENT: Positive for hearing loss and tinnitus.   Cardiovascular: Positive for leg swelling.  Neurological: Positive for dizziness, weakness and numbness.       Insomnia, restless legs.   Patient reports that he has H/O dizziness but has been worse in past 4 months. This makes the patient feel unbalanced.   R hand numbness.   Psychiatric/Behavioral:       Not enough sleep, decreased energy     Objective:   Neurologic Exam  Physical Exam Physical Examination:   Filed Vitals:   11/22/14 1532  BP: 114/62  Pulse: 76  Resp:    General Examination: The patient is a very pleasant 81 y.o. male in no acute distress. He appears well-developed and well-nourished and very well groomed.   HEENT: Normocephalic, atraumatic, pupils are equal, round and reactive to light and accommodation. Funduscopic exam is normal with sharp disc margins noted. Extraocular tracking is good without limitation to gaze excursion or nystagmus noted. Normal smooth pursuit is noted. Hearing is grossly intact. Face is symmetric with normal facial animation and normal facial sensation. Speech is clear with no dysarthria noted. There is no hypophonia. There is no lip, neck/head, jaw or voice tremor. Neck is perhaps slightly rigid, and he may have had a very slight lower jaw tremor intermittently. This was very brief. There are no carotid bruits on auscultation. Oropharynx exam reveals: mild mouth dryness, adequate dental hygiene and mild airway crowding. Mallampati is class II. Tongue protrudes centrally and palate elevates symmetrically.   Chest: Clear to auscultation without wheezing, rhonchi or crackles noted.  Heart: S1+S2+0, regular and normal without murmurs, rubs or gallops noted.   Abdomen: Soft, non-tender and non-distended with normal bowel sounds appreciated on auscultation.  Extremities: There is 2+ pitting edema in the distal lower extremities bilaterally. Pedal pulses are intact.  Skin: Warm and dry without trophic changes noted. There are no varicose veins.  Musculoskeletal: exam reveals no obvious joint deformities, tenderness or joint swelling or erythema.   Neurologically:  Mental status: The patient is awake, alert and oriented in all 4 spheres. His immediate and remote memory, attention, language skills and fund of knowledge are appropriate. There is no evidence of aphasia, agnosia, apraxia or anomia. Speech  is clear with normal prosody and enunciation. Thought process is linear. Mood is normal and affect is normal.  Cranial nerves II - XII are as described above under HEENT exam. In addition: shoulder shrug is normal with equal  shoulder height noted. Motor exam: Normal bulk, strength and tone is noted. There is no drift, tremor or rebound, but he may have had an intermittent resting tremor in the left hand, very brief. Reflexes are 1+ throughout. Babinski: Toes are flexor bilaterally. Fine motor skills and coordination are mildly impaired globally, maybe right a little bit worse than left especially in the lower extremities. Heel-to-shin is not possible. He stands up slowly and has to push himself up. He takes several seconds to get his bearings. He denies lightheadedness and reports mild spinning sensation in his head. Romberg is not possible to do as he cannot stand without using his cane. Posture is abnormal with significant left tilt but also increase in lumbar kyphosis. He walks slowly and cautiously. He uses his cane and his right hand. He has a 4 pronged cane. Cerebellar testing: No dysmetria or intention tremor on finger to nose testing.  Sensory exam: intact to light touch, pinprick, vibration, temperature sense in the upper and lower extremities, somewhat decreased for vibration in the distal lower extremities but this could be confounded by the swelling.           Assessment and Plan:   In summary, RAAHIL ONG is a very pleasant 81 y.o.-year old male with an underlying complex medical history of hypothyroidism, melanoma in 1976, heart disease, status post CABG in 1996, 4 vessels, and pacemaker placement secondary to bradycardia in 2010, hypertension, hyperlipidemia, reflux disease, insomnia, overweight state and restless leg symptoms, who reports problems with dizziness and his balance.  on examination he has nonspecific symptoms of lightheadedness and spinning sensation but his history is not  supportive of orthostatic hypotension or vertigo or any specific underlying neurological issue. Nevertheless, there are probably some contributors to his symptomatology including medication side effect, lower extremity swelling, not being always fully hydrated, not resting well, normal aging, abnormal posture, and fear of falling. I had a long discussion with the patient and his family today. I suggested a few things today: I would like for him to consider tapering off ropinirole as it can be the cause of his lower extremity swelling. He is advised to reduce it to 1 pill each night for a week, then half a pill each night for a week then stop. His history is suggestive of REM behavior disorder. While he does not have any telltale signs of parkinsonism, we will monitor his exam for this. I would like for him to reduce his trazodone to half a pill each night and once he is off of ropinirole I would like for him to start low-dose gabapentin in the hopes that it will help him rest better at night and reduce his restless leg symptoms but also be less likely to cause him side effects or exacerbation of his RBD. For his lower extremity swelling he is advised to use his compression stockings during the day and elevate his legs when he can. He is advised to talk to his cardiologist about the swelling as well. He had a nuclear medicine scan last year in December of his heart which showed an EF of 45%. He is advised to stay well hydrated, drink about 6-8 glasses of water if possible and use his walker and cane for safety. I think it is important that he stay physically active. I would like to see him back in 3 months, sooner if needed. I provided him with written instructions and a new prescription for gabapentin electronically as well as titration instructions. I  answered all the questions today and the patient and his family were in agreement. Please see that he has had vitamin D, and B12 and TSH checked within the last year.    Thank you very much for allowing me to participate in the care of this nice patient. If I can be of any further assistance to you please do not hesitate to call me at [phone].  Sincerely,   Star Age, MD, PhD  I spent 45 minutes in total face-to-face time with the patient, more than 50% of which was spent in counseling and coordination of care, reviewing test results, reviewing medication and discussing or reviewing the diagnosis of dizziness, RLS, lower extremity swelling, RBD, the prognosis and treatment options.

## 2014-11-22 NOTE — Patient Instructions (Signed)
Please ask Dr. Shelia Media to check your vitamin D and B12 levels.  Please taper off of ropinirole: take 1 pill each night for 1 week, then 1/2 pill each night for one week, then stop.  Reduce trazodone to 50 mg 1/2 pill each night for now, with the idea that you could come off of trazodone in the future.  Once you are off of ropinirole, start low dose Neurontin (gabapentin) 100 mg strength: Take 1 pill nightly at bedtime for 1 week, then 2 pills nightly for 1 week, then 3 pills each night thereafter. The most common side effects reported are sedation or sleepiness. Rare side effects include balance problems, confusion.  Drink more water, about 6-8 glasses. Elevate your legs when you can, use your compression stockings during the day. Keep appt with cardiology as well. Use your walker or cane.

## 2014-11-28 ENCOUNTER — Ambulatory Visit: Payer: Medicare Other | Admitting: Neurology

## 2014-12-03 ENCOUNTER — Telehealth: Payer: Self-pay | Admitting: Physician Assistant

## 2014-12-03 ENCOUNTER — Emergency Department (HOSPITAL_COMMUNITY): Payer: Medicare Other

## 2014-12-03 ENCOUNTER — Encounter (HOSPITAL_COMMUNITY): Payer: Self-pay | Admitting: Family Medicine

## 2014-12-03 ENCOUNTER — Observation Stay (HOSPITAL_COMMUNITY)
Admission: EM | Admit: 2014-12-03 | Discharge: 2014-12-05 | Disposition: A | Discharge status: Home / Self Care | Payer: Medicare Other | Attending: Family Medicine | Admitting: Family Medicine

## 2014-12-03 ENCOUNTER — Observation Stay (HOSPITAL_COMMUNITY): Payer: Medicare Other

## 2014-12-03 DIAGNOSIS — Z8619 Personal history of other infectious and parasitic diseases: Secondary | ICD-10-CM | POA: Diagnosis not present

## 2014-12-03 DIAGNOSIS — Z951 Presence of aortocoronary bypass graft: Secondary | ICD-10-CM | POA: Insufficient documentation

## 2014-12-03 DIAGNOSIS — Z7982 Long term (current) use of aspirin: Secondary | ICD-10-CM | POA: Insufficient documentation

## 2014-12-03 DIAGNOSIS — R0602 Shortness of breath: Secondary | ICD-10-CM | POA: Diagnosis present

## 2014-12-03 DIAGNOSIS — M25569 Pain in unspecified knee: Secondary | ICD-10-CM

## 2014-12-03 DIAGNOSIS — I10 Essential (primary) hypertension: Secondary | ICD-10-CM | POA: Diagnosis not present

## 2014-12-03 DIAGNOSIS — K449 Diaphragmatic hernia without obstruction or gangrene: Secondary | ICD-10-CM | POA: Insufficient documentation

## 2014-12-03 DIAGNOSIS — Z950 Presence of cardiac pacemaker: Secondary | ICD-10-CM | POA: Diagnosis not present

## 2014-12-03 DIAGNOSIS — R42 Dizziness and giddiness: Secondary | ICD-10-CM | POA: Diagnosis not present

## 2014-12-03 DIAGNOSIS — Z880 Allergy status to penicillin: Secondary | ICD-10-CM | POA: Insufficient documentation

## 2014-12-03 DIAGNOSIS — G4700 Insomnia, unspecified: Secondary | ICD-10-CM | POA: Diagnosis not present

## 2014-12-03 DIAGNOSIS — Z79899 Other long term (current) drug therapy: Secondary | ICD-10-CM | POA: Diagnosis not present

## 2014-12-03 DIAGNOSIS — K219 Gastro-esophageal reflux disease without esophagitis: Secondary | ICD-10-CM | POA: Diagnosis not present

## 2014-12-03 DIAGNOSIS — Z7902 Long term (current) use of antithrombotics/antiplatelets: Secondary | ICD-10-CM | POA: Diagnosis not present

## 2014-12-03 DIAGNOSIS — E785 Hyperlipidemia, unspecified: Secondary | ICD-10-CM | POA: Diagnosis not present

## 2014-12-03 DIAGNOSIS — E039 Hypothyroidism, unspecified: Secondary | ICD-10-CM | POA: Insufficient documentation

## 2014-12-03 DIAGNOSIS — I2510 Atherosclerotic heart disease of native coronary artery without angina pectoris: Secondary | ICD-10-CM | POA: Diagnosis not present

## 2014-12-03 DIAGNOSIS — G8929 Other chronic pain: Secondary | ICD-10-CM

## 2014-12-03 DIAGNOSIS — I255 Ischemic cardiomyopathy: Secondary | ICD-10-CM | POA: Diagnosis present

## 2014-12-03 DIAGNOSIS — M1990 Unspecified osteoarthritis, unspecified site: Secondary | ICD-10-CM | POA: Insufficient documentation

## 2014-12-03 DIAGNOSIS — E860 Dehydration: Secondary | ICD-10-CM | POA: Diagnosis not present

## 2014-12-03 HISTORY — DX: Other chronic pain: G89.29

## 2014-12-03 HISTORY — DX: Pain in unspecified knee: M25.569

## 2014-12-03 LAB — CBC
HCT: 42.9 % (ref 39.0–52.0)
Hemoglobin: 14 g/dL (ref 13.0–17.0)
MCH: 32.3 pg (ref 26.0–34.0)
MCHC: 32.6 g/dL (ref 30.0–36.0)
MCV: 99.1 fL (ref 78.0–100.0)
Platelets: 228 10*3/uL (ref 150–400)
RBC: 4.33 MIL/uL (ref 4.22–5.81)
RDW: 15 % (ref 11.5–15.5)
WBC: 7.6 10*3/uL (ref 4.0–10.5)

## 2014-12-03 LAB — URINALYSIS, ROUTINE W REFLEX MICROSCOPIC
Bilirubin Urine: NEGATIVE
Glucose, UA: NEGATIVE mg/dL
Hgb urine dipstick: NEGATIVE
Ketones, ur: NEGATIVE mg/dL
Nitrite: NEGATIVE
Protein, ur: NEGATIVE mg/dL
Specific Gravity, Urine: 1.017 (ref 1.005–1.030)
Urobilinogen, UA: 1 mg/dL (ref 0.0–1.0)
pH: 7.5 (ref 5.0–8.0)

## 2014-12-03 LAB — I-STAT TROPONIN, ED: Troponin i, poc: 0.02 ng/mL (ref 0.00–0.08)

## 2014-12-03 LAB — URINE MICROSCOPIC-ADD ON

## 2014-12-03 LAB — BASIC METABOLIC PANEL
Anion gap: 8 (ref 5–15)
BUN: 16 mg/dL (ref 6–20)
CO2: 30 mmol/L (ref 22–32)
Calcium: 9.2 mg/dL (ref 8.9–10.3)
Chloride: 98 mmol/L — ABNORMAL LOW (ref 101–111)
Creatinine, Ser: 0.93 mg/dL (ref 0.61–1.24)
GFR calc Af Amer: >60 mL/min (ref >60)
GFR calc non Af Amer: >60 mL/min (ref >60)
Glucose, Bld: 97 mg/dL (ref 65–99)
Potassium: 4.1 mmol/L (ref 3.5–5.1)
Sodium: 136 mmol/L (ref 135–145)

## 2014-12-03 LAB — TROPONIN I
Troponin I: <0.03 ng/mL (ref <0.031)
Troponin I: <0.03 ng/mL (ref <0.031)

## 2014-12-03 LAB — HEPATIC FUNCTION PANEL
ALT: 32 U/L (ref 17–63)
AST: 32 U/L (ref 15–41)
Albumin: 3.6 g/dL (ref 3.5–5.0)
Alkaline Phosphatase: 70 U/L (ref 38–126)
Bilirubin, Direct: 0.1 mg/dL (ref 0.1–0.5)
Indirect Bilirubin: 0.5 mg/dL (ref 0.3–0.9)
Total Bilirubin: 0.6 mg/dL (ref 0.3–1.2)
Total Protein: 7.1 g/dL (ref 6.5–8.1)

## 2014-12-03 LAB — BRAIN NATRIURETIC PEPTIDE: B Natriuretic Peptide: 66.8 pg/mL (ref 0.0–100.0)

## 2014-12-03 MED ORDER — ASPIRIN EC 81 MG PO TBEC
81.0000 mg | DELAYED_RELEASE_TABLET | Freq: Every day | ORAL | Status: DC
  Administered 2014-12-03 – 2014-12-05 (×3): 81 mg via ORAL
  Filled 2014-12-03 (×3): qty 1

## 2014-12-03 MED ORDER — ROPINIROLE HCL 0.5 MG PO TABS
0.2500 mg | ORAL_TABLET | Freq: Every day | ORAL | Status: DC
  Administered 2014-12-03 – 2014-12-04 (×2): 0.25 mg via ORAL
  Filled 2014-12-03 (×2): qty 1

## 2014-12-03 MED ORDER — LEVOTHYROXINE SODIUM 100 MCG PO TABS
100.0000 ug | ORAL_TABLET | Freq: Every day | ORAL | Status: DC
  Administered 2014-12-04 – 2014-12-05 (×2): 100 ug via ORAL
  Filled 2014-12-03 (×2): qty 1

## 2014-12-03 MED ORDER — PANTOPRAZOLE SODIUM 40 MG PO TBEC
40.0000 mg | DELAYED_RELEASE_TABLET | Freq: Two times a day (BID) | ORAL | Status: DC
  Administered 2014-12-03 – 2014-12-05 (×4): 40 mg via ORAL
  Filled 2014-12-03 (×4): qty 1

## 2014-12-03 MED ORDER — CLOPIDOGREL BISULFATE 75 MG PO TABS
75.0000 mg | ORAL_TABLET | Freq: Every day | ORAL | Status: DC
  Administered 2014-12-03 – 2014-12-05 (×3): 75 mg via ORAL
  Filled 2014-12-03 (×3): qty 1

## 2014-12-03 MED ORDER — FAMOTIDINE 20 MG PO TABS
20.0000 mg | ORAL_TABLET | Freq: Every day | ORAL | Status: DC
  Administered 2014-12-03 – 2014-12-04 (×2): 20 mg via ORAL
  Filled 2014-12-03 (×2): qty 1

## 2014-12-03 MED ORDER — SODIUM CHLORIDE 0.9 % IV BOLUS (SEPSIS)
250.0000 mL | Freq: Once | INTRAVENOUS | Status: AC
  Administered 2014-12-03: 250 mL via INTRAVENOUS

## 2014-12-03 MED ORDER — ONDANSETRON HCL 4 MG PO TABS: 4.0000 mg | ORAL_TABLET | Freq: Four times a day (QID) | ORAL | Status: DC | PRN

## 2014-12-03 MED ORDER — ACETAMINOPHEN 325 MG PO TABS: 650.0000 mg | ORAL_TABLET | Freq: Four times a day (QID) | ORAL | Status: DC | PRN

## 2014-12-03 MED ORDER — AMIODARONE HCL 200 MG PO TABS
400.0000 mg | ORAL_TABLET | Freq: Every day | ORAL | Status: DC
  Administered 2014-12-03 – 2014-12-05 (×3): 400 mg via ORAL
  Filled 2014-12-03 (×4): qty 2

## 2014-12-03 MED ORDER — ALUM & MAG HYDROXIDE-SIMETH 200-200-20 MG/5ML PO SUSP: 30.0000 mL | Freq: Four times a day (QID) | ORAL | Status: DC | PRN

## 2014-12-03 MED ORDER — DOCUSATE SODIUM 100 MG PO CAPS
100.0000 mg | ORAL_CAPSULE | Freq: Two times a day (BID) | ORAL | Status: DC
  Administered 2014-12-03 – 2014-12-05 (×4): 100 mg via ORAL
  Filled 2014-12-03 (×4): qty 1

## 2014-12-03 MED ORDER — SORBITOL 70 % SOLN: 30.0000 mL | Freq: Every day | Status: DC | PRN

## 2014-12-03 MED ORDER — HYDROCODONE-ACETAMINOPHEN 5-325 MG PO TABS
1.0000 | ORAL_TABLET | ORAL | Status: DC | PRN
  Administered 2014-12-03: 2 via ORAL
  Administered 2014-12-04: 1 via ORAL
  Administered 2014-12-04 – 2014-12-05 (×2): 2 via ORAL
  Filled 2014-12-03: qty 1
  Filled 2014-12-03 (×3): qty 2

## 2014-12-03 MED ORDER — MAGNESIUM HYDROXIDE 400 MG/5ML PO SUSP
30.0000 mL | Freq: Every day | ORAL | Status: DC | PRN
  Filled 2014-12-03: qty 30

## 2014-12-03 MED ORDER — SODIUM CHLORIDE 0.9 % IJ SOLN
3.0000 mL | Freq: Two times a day (BID) | INTRAMUSCULAR | Status: DC
  Administered 2014-12-03 – 2014-12-05 (×2): 3 mL via INTRAVENOUS

## 2014-12-03 MED ORDER — ONDANSETRON HCL 4 MG/2ML IJ SOLN: 4.0000 mg | Freq: Four times a day (QID) | INTRAMUSCULAR | Status: DC | PRN

## 2014-12-03 MED ORDER — ACETAMINOPHEN 650 MG RE SUPP: 650.0000 mg | Freq: Four times a day (QID) | RECTAL | Status: DC | PRN

## 2014-12-03 MED ORDER — TRAZODONE HCL 50 MG PO TABS
50.0000 mg | ORAL_TABLET | Freq: Every day | ORAL | Status: DC
  Administered 2014-12-03 – 2014-12-04 (×2): 50 mg via ORAL
  Filled 2014-12-03 (×2): qty 1

## 2014-12-03 MED ORDER — TRAZODONE HCL 50 MG PO TABS: 25.0000 mg | ORAL_TABLET | Freq: Every evening | ORAL | Status: DC | PRN

## 2014-12-03 MED ORDER — MAGNESIUM CITRATE PO SOLN: 1.0000 | Freq: Once | ORAL | Status: DC | PRN

## 2014-12-03 MED ORDER — SODIUM CHLORIDE 0.9 % IV SOLN
INTRAVENOUS | Status: AC
  Administered 2014-12-03: 21:00:00 via INTRAVENOUS

## 2014-12-03 NOTE — ED Notes (Signed)
Pt here for 2 days of dizziness, weakness. sts a few weeks ago he was severely constipated and now diarrhea.

## 2014-12-03 NOTE — ED Notes (Signed)
Have attempted to interrogate pacemaker multiple times, spoke with [facility] rep and they are coming to interrogate

## 2014-12-03 NOTE — ED Provider Notes (Signed)
CSN: [id number]     Arrival date & time 12/03/14  1153 History   First MD Initiated Contact with Patient 12/03/14 1224     Chief Complaint  Patient presents with  . Dizziness  . Weakness     (Consider location/radiation/quality/duration/timing/severity/associated sxs/prior Treatment) The history is provided by the patient.     Pt with hx CAD, CABG, stents last cath 2012 found to be graft dependent, pacemaker,cardiomyopathy class II, hx orthostatic hypotension, GERD, presents with lightheadness that has been ongoing x 1 month but much worse since yesterday.  He has progressed from walking on his own to walking with a cane and now a walker - states he feels like he will fall if he walks.  States he feels like his head is moving around as he is sitting still.  Has had SOB x 2-3 weeks.  Has chronic lower extremity edema that is worse than usual.  Coughing a lot at night, productive of large amount of phlegm.  Had constipation and was given medication for this, now has had diarrhea x 2-3 weeks.   Has had several medication changes this month and notes he took himself off of amiodarone in the past 3-4 days to see if it might help his lightheadedness and he has increased his torsemide from every other day to every day.  Pt states he has had MIs in the past and states he has never had CP, his symptoms have included weakness, lightheadedness, SOB.  Denies fevers, chills, body aches, any pain anywhere including chest pain, nausea.   Past Medical History  Diagnosis Date  . CAD (coronary artery disease)     CABG (LIMA-LAD, SVG-RCA, SVG-OM in 1996).  07/2009 BMS to SVG-RCA. Cath in 04/2010 with patent stents   . Bradycardia     AFib/SSS s/p St Jude PPM 04/12/2008  . Hypothyroid   . Pacemaker   . Hyperlipidemia   . GERD (gastroesophageal reflux disease)   . Hiatal hernia   . Insomnia   . S/P CABG x 4   . RBBB   . Melanoma of back ([place]) 1976  . Heart murmur     "just told I had one today"  (12/15/2012)  . Myocardial infarction [facility]) 1996; 2011    "both silent" (12/15/2012)  . Exertional shortness of breath     "sometimes walking" (12/15/2012)  . Arthritis     "minor, back and sometimes knees" (12/15/2012)  . Sustained ventricular tachycardia ([place]) 07/27/2014  . Hypertension   . [place] spotted fever    Past Surgical History  Procedure Laterality Date  . US echocardiography  07/11/2009    EF 45-50%  . Nm myoview ltd  06/2011    low risk  . Coronary artery bypass graft  1996    LIMA to LAD,SVG to RCA & SVG to OM  . Tonsillectomy  1938  . Appendectomy  1942  . Coronary angioplasty with stent placement  07/2009    bare metal stent to SVG to the RCA  . Cardiac catheterization  04/2010    LIMA to LAD patent,SVG to OM patent,no in-stnet restenosis RCA  . Cataract extraction w/ intraocular lens  implant, bilateral Bilateral 2012  . Melanoma excision  05/1974 X2    "taken off my back" (12/15/2012)  . Transurethral resection of prostate  1986  . Insert / replace / remove pacemaker  2010   Family History  Problem Relation Age of Onset  . Coronary artery disease Mother   . Diabetes Mother   .  Heart disease Mother   . Coronary artery disease Father   . Diabetes Father   . Lung cancer Father   . Heart disease Father    Social History  Substance Use Topics  . Smoking status: Never Smoker   . Smokeless tobacco: Never Used  . Alcohol Use: No    Review of Systems  All other systems reviewed and are negative.     Allergies  Bactrim; Crestor; Altace; Penicillins; and Sulfa antibiotics  Home Medications   Prior to Admission medications   Medication Sig Start Date End Date Taking? Authorizing Provider  amiodarone (PACERONE) 200 MG tablet Take 2 tablets (400 mg total) by mouth daily. 07/26/14   Mihai Croitoru, MD  aspirin EC 81 MG EC tablet Take 1 tablet (81 mg total) by mouth daily. 12/16/12   Isaiah Serge, NP  Cholecalciferol (VITAMIN D3) 5000 UNITS CAPS Take  by mouth.    Historical Provider, MD  clobetasol ointment (TEMOVATE) 2.94 % Apply 1 application topically 2 (two) times daily.    Historical Provider, MD  clopidogrel (PLAVIX) 75 MG tablet Take 75 mg by mouth daily.    Historical Provider, MD  famotidine (PEPCID) 20 MG tablet Take 20 mg by mouth at bedtime.    Historical Provider, MD  levothyroxine (SYNTHROID, LEVOTHROID) 100 MCG tablet Take 100 mcg by mouth daily.    Historical Provider, MD  pantoprazole (PROTONIX) 40 MG tablet TAKE 1 TABLET BY MOUTH TWICE A DAY 02/28/14   Mihai Croitoru, MD  pravastatin (PRAVACHOL) 40 MG tablet Take 40 mg by mouth daily. 09/20/14   Historical Provider, MD  rOPINIRole (REQUIP) 0.25 MG tablet Take 0.25-0.5 mg by mouth at bedtime.    Historical Provider, MD  torsemide (DEMADEX) 20 MG tablet Take 20 mg by mouth every other day.     Historical Provider, MD  traZODone (DESYREL) 50 MG tablet Take 50 mg by mouth at bedtime.    Historical Provider, MD   BP 122/73 mmHg  Pulse 70  Temp(Src) 97.3 F (36.3 C) (Oral)  Resp 18  SpO2 98% Physical Exam  Constitutional: He appears well-developed and well-nourished. No distress.  HENT:  Head: Normocephalic and atraumatic.  Neck: Neck supple.  Cardiovascular: Normal rate, regular rhythm and intact distal pulses.   Pulmonary/Chest: Effort normal and breath sounds normal. No respiratory distress. He has no wheezes. He has no rales.  Abdominal: Soft. He exhibits no distension. There is no tenderness. There is no rebound and no guarding.  Musculoskeletal: He exhibits edema (lower extremity edema, wearing compressing stockings ).  Neurological: He is alert. He exhibits normal muscle tone.  Skin: He is not diaphoretic.  Nursing note and vitals reviewed.   ED Course  Procedures (including critical care time) Labs Review Labs Reviewed  BASIC METABOLIC PANEL - Abnormal; Notable for the following:    Chloride 98 (*)    All other components within normal limits  CBC  HEPATIC  FUNCTION PANEL  BRAIN NATRIURETIC PEPTIDE  TROPONIN I  URINALYSIS, ROUTINE W REFLEX MICROSCOPIC (NOT AT [facility])  CBG MONITORING, ED  Randolm Idol, ED    Imaging Review Dg Chest Portable 1 View  12/03/2014  CLINICAL DATA:  Dizzy and lightheaded. EXAM: PORTABLE CHEST 1 VIEW COMPARISON:  08/11/2014, chest radiograph. FINDINGS: Dual lead cardiac pacemaker is stable. Postsurgical changes from CABG are also noted. Cardiomediastinal silhouette is stably enlarged. Mediastinal contours appear intact. Atherosclerotic calcifications of the aorta are noted. There is a moderate in size hiatal hernia. There is no  evidence of focal airspace consolidation, pleural effusion or pneumothorax. Osseous structures are without acute abnormality. Soft tissues are grossly normal. IMPRESSION: No evidence of acute cardiopulmonary process. Stable enlargement of the cardiac silhouette. Moderate in size hiatal hernia. Electronically Signed   By: Fidela Salisbury M.D.   On: 12/03/2014 13:33   I have personally reviewed and evaluated these images and lab results as part of my medical decision-making.   EKG Interpretation   Date/Time:  Saturday December 03 2014 12:03:01 EDT Ventricular Rate:  70 PR Interval:  234 QRS Duration: 172 QT Interval:  518 QTC Calculation: 559 R Axis:   -69 Text Interpretation:  AV dual-paced rhythm with prolonged AV conduction  Abnormal ECG No significant change since last tracing Confirmed by  [facility] MD, ERIN ([id number]) on 12/03/2014 12:28:16 PM       12:59 PM Discussed pt with Dr Mayco Fischer.    3:46 PM I spoke with Dyanne Carrel, NP, who accepts patient for obs admission, requests PT evaluation.  Pacemaker interrogation still pending at time of admission.  Attending MD Dr Rowe Pavy.   MDM   Final diagnoses:  Lightheadedness  SOB (shortness of breath)    Pt with hx CABG, s/p MI, pacemaker in place, lightheadedness x 1 month, much worse over two days with associated SOB.  Pt has  never had CP with his MIs.  Labs including troponin, BNP negative.  EKG reviewed with Dr Maddax Fischer, no ischemic changes.  BP decreases with orthostatic VS significantly but pt never becomes hypotensive.  Pacemaker pending interrogation.  Concern for possible pacemaker events vs mild orthostatics vs anginal equivalent.  Pt admitted to [facility] for continued evaluation and treatment.    Clayton Bibles, PA-C 12/03/14 1551  Gareth Morgan, MD 12/05/14 2241

## 2014-12-03 NOTE — Telephone Encounter (Signed)
The patient wife called stating he had an episode of dizziness yesterday and is having a prolong episode now which is going on 2 hours.  She is having a hard time finding his pulse when previously she's not had difficulty. Records indicate that he's had episodes of ventricular tachycardia when he's felt dizzy.  I recommended she call EMS right now so they can evaluate him.    Lamika Connolly, PAC

## 2014-12-03 NOTE — ED Notes (Signed)
Pt remains monitored by blood pressure, pulse ox, and 12 lead. pts family remain at bedside. Pt encouraged to provide urine specimen.

## 2014-12-03 NOTE — Progress Notes (Signed)
Physical Therapy Evaluation Patient Details Name: Adam [name] MRN: [id number] DOB: Sep 04, 1926 Today's Date: 12/03/2014   History of Present Illness  Pt is an 81 y.o. male with PMH consisting of CAD, CABG stents 2012, pacemaker, cardiomyopathy, orthostatic hypertension, GERD, and 4 year history of dizziness. He presents to the ED with worsening dizziness, SOB and L knee pain.  Clinical Impression  Pt admitted with above diagnosis. Pt currently with functional limitations due to the deficits listed below (see PT Problem List). On eval, pt required min guard assist for all functional mobility. Gait distance limited by monitor lines in ED as well as pain L knee. Pt will benefit from skilled PT to increase their independence and safety with mobility to allow discharge to the venue listed below.       Follow Up Recommendations Home health PT;Supervision for mobility/OOB    Equipment Recommendations  Rolling walker with 5" wheels    Recommendations for Other Services       Precautions / Restrictions Precautions Precautions: Fall      Mobility  Bed Mobility Overal bed mobility: Needs Assistance Bed Mobility: Supine to Sit;Sit to Supine     Supine to sit: Min guard Sit to supine: Min guard   General bed mobility comments: min guard for safety due to on stretcher in ED.  Transfers Overall transfer level: Needs assistance Equipment used: Rolling walker (2 wheeled) Transfers: Sit to/from [facility] Sit to Stand: Min guard Stand pivot transfers: Min guard       General transfer comment: verbal cues for hand placement  Ambulation/Gait Ambulation/Gait assistance: Min guard Ambulation Distance (Feet): 3 Feet Assistive device: Rolling walker (2 wheeled) Gait Pattern/deviations: Step-to pattern;Antalgic Gait velocity: decreased   General Gait Details: distance limited by monitor lines  Stairs            Wheelchair Mobility    Modified Rankin (Stroke  Patients Only)       Balance                                             Pertinent Vitals/Pain Pain Assessment: 0-10 Pain Score: 7  Pain Location: L knee with WB Pain Descriptors / Indicators: Sharp Pain Intervention(s): Limited activity within patient's tolerance;Monitored during session    [place] expects to be discharged to:: Private residence Living Arrangements: Spouse/significant other Available Help at Discharge: Family;Available 24 hours/day Type of Home: House Home Access: Stairs to enter Entrance Stairs-Rails: None Entrance Stairs-Number of Steps: 1 Home Layout: One level Home Equipment: Environmental consultant - standard      Prior Function Level of Independence: Independent with assistive device(s)               Hand Dominance   Dominant Hand: Right    Extremity/Trunk Assessment   Upper Extremity Assessment: Generalized weakness           Lower Extremity Assessment: Generalized weakness      Cervical / Trunk Assessment: Kyphotic  Communication   Communication: No difficulties  Cognition Arousal/Alertness: Awake/alert Behavior During Therapy: WFL for tasks assessed/performed Overall Cognitive Status: Within Functional Limits for tasks assessed                      General Comments      Exercises        Assessment/Plan    PT Assessment Patient  needs continued PT services  PT Diagnosis Difficulty walking;Acute pain;Generalized weakness   PT Problem List Decreased strength;Decreased activity tolerance;Decreased balance;Decreased mobility;Pain;Decreased knowledge of use of DME  PT Treatment Interventions DME instruction;Gait training;Stair training;Functional mobility training;Therapeutic activities;Therapeutic exercise;Balance training;Patient/family education   PT Goals (Current goals can be found in the Care Plan section) Acute Rehab PT Goals Patient Stated Goal: ambulate in the community PT Goal  Formulation: With patient Time For Goal Achievement: 12/17/14 Potential to Achieve Goals: Good    Frequency Min 3X/week   Barriers to discharge        Co-evaluation               End of Session Equipment Utilized During Treatment: Gait belt Activity Tolerance: Patient limited by pain Patient left: in bed;with call bell/phone within reach;with family/visitor present Nurse Communication: Mobility status    Functional Assessment Tool Used: clinical judgement Functional Limitation: Mobility: Walking and moving around Mobility: Walking and Moving Around Current Status [phone]): At least 1 percent but less than 20 percent impaired, limited or restricted Mobility: Walking and Moving Around Goal Status [phone]): At least 1 percent but less than 20 percent impaired, limited or restricted    Time: [id number] PT Time Calculation (min) (ACUTE ONLY): 46 min   Charges:   PT Evaluation $Initial PT Evaluation Tier I: 1 Procedure PT Treatments $Gait Training: 8-22 mins $Therapeutic Activity: 8-22 mins   PT G Codes:   PT G-Codes **NOT FOR INPATIENT CLASS** Functional Assessment Tool Used: clinical judgement Functional Limitation: Mobility: Walking and moving around Mobility: Walking and Moving Around Current Status ([id number]): At least 1 percent but less than 20 percent impaired, limited or restricted Mobility: Walking and Moving Around Goal Status [phone]): At least 1 percent but less than 20 percent impaired, limited or restricted    [name] 12/03/2014, 5:57 PM

## 2014-12-03 NOTE — H&P (Signed)
[facility] History and Physical  Adam [name] YBW:[id number] DOB: 1926/10/19 DOA: 12/03/2014  Referring physician: west PCP: Horatio Pel, MD   Chief Complaint: worsening dizziness  HPI: Adam [name] is a 81 y.o. male past medical history that includes CAD, CABG stents 2012, pacemaker, cardiomyopathy, orthostatic hypertension, GERD, 4 year history of dizziness presents to the emergency Department chief complaint of worsening dizziness. Initial evaluation reveals mild orthostatic hypotension and dehydration.  Patient reports over the last 3 days a worsening of his chronic dizziness. Associated symptoms include that as of breath with exertion as well as recent diarrhea secondary to vigorous constipation therapies, decreased oral input in the setting of diuretics. He denies chest pain palpitations, headache visual disturbances numbness or tingling of extremities. He denies any abdominal pain nausea vomiting dysuria hematuria frequency or urgency. He denies any cough fever chills or recent travel.  Workup in the emergency department includes, basic metabolic panel, complete blood count, BMP all unremarkable. Initial troponin is negative. Hepatic function panel within the limits of normal. Urinalysis unremarkable chest x-ray no evidence of acute cardiopulmonary process stable enlargement of cardiac silhouette moderate hiatal hernia, EKG AV drip paced rhythm, pacer being interrogated.  The emergency department he is afebrile hemodynamically stable and not hypoxic. He is mildly orthostatic from sitting to standing but not hypotensive. In the emergency department he is given 250 mL of normal saline intravenously.  Review of Systems:  10 point review of systems complete systems are normal except as indicated in the history of present illness  Past Medical History  Diagnosis Date  . CAD (coronary artery disease)     CABG (LIMA-LAD, SVG-RCA, SVG-OM in 1996).  07/2009 BMS to SVG-RCA.  Cath in 04/2010 with patent stents   . Bradycardia     AFib/SSS s/p St Jude PPM 04/12/2008  . Hypothyroid   . Pacemaker   . Hyperlipidemia   . GERD (gastroesophageal reflux disease)   . Hiatal hernia   . Insomnia   . S/P CABG x 4   . RBBB   . Melanoma of back ([place]) 1976  . Heart murmur     "just told I had one today" (12/15/2012)  . Myocardial infarction [facility]) 1996; 2011    "both silent" (12/15/2012)  . Exertional shortness of breath     "sometimes walking" (12/15/2012)  . Arthritis     "minor, back and sometimes knees" (12/15/2012)  . Sustained ventricular tachycardia ([place]) 07/27/2014  . Hypertension   . [place] spotted fever    Past Surgical History  Procedure Laterality Date  . US echocardiography  07/11/2009    EF 45-50%  . Nm myoview ltd  06/2011    low risk  . Coronary artery bypass graft  1996    LIMA to LAD,SVG to RCA & SVG to OM  . Tonsillectomy  1938  . Appendectomy  1942  . Coronary angioplasty with stent placement  07/2009    bare metal stent to SVG to the RCA  . Cardiac catheterization  04/2010    LIMA to LAD patent,SVG to OM patent,no in-stnet restenosis RCA  . Cataract extraction w/ intraocular lens  implant, bilateral Bilateral 2012  . Melanoma excision  05/1974 X2    "taken off my back" (12/15/2012)  . Transurethral resection of prostate  1986  . Insert / replace / remove pacemaker  2010   Social History:  reports that he has never smoked. He has never used smokeless tobacco. He reports that he does not drink  alcohol or use illicit drugs.  Allergies  Allergen Reactions  . Bactrim [Sulfamethoxazole-Trimethoprim]   . Crestor [Rosuvastatin Calcium]   . Altace [Ramipril] Other (See Comments)    Cough  . Penicillins Rash  . Sulfa Antibiotics Rash    Family History  Problem Relation Age of Onset  . Coronary artery disease Mother   . Diabetes Mother   . Heart disease Mother   . Coronary artery disease Father   . Diabetes Father   . Lung cancer  Father   . Heart disease Father     Prior to Admission medications   Medication Sig Start Date End Date Taking? Authorizing Provider  amiodarone (PACERONE) 200 MG tablet Take 2 tablets (400 mg total) by mouth daily. 07/26/14  Yes Mihai Croitoru, MD  aspirin EC 81 MG EC tablet Take 1 tablet (81 mg total) by mouth daily. 12/16/12  Yes Isaiah Serge, NP  Cholecalciferol (VITAMIN D3) 5000 UNITS CAPS Take 1 capsule by mouth once a week.    Yes Historical Provider, MD  clobetasol ointment (TEMOVATE) 1.28 % Apply 1 application topically 2 (two) times daily.   Yes Historical Provider, MD  clopidogrel (PLAVIX) 75 MG tablet Take 75 mg by mouth daily.   Yes Historical Provider, MD  famotidine (PEPCID) 20 MG tablet Take 20 mg by mouth at bedtime.   Yes Historical Provider, MD  levothyroxine (SYNTHROID, LEVOTHROID) 100 MCG tablet Take 100 mcg by mouth daily.   Yes Historical Provider, MD  pantoprazole (PROTONIX) 40 MG tablet TAKE 1 TABLET BY MOUTH TWICE A DAY 02/28/14  Yes Mihai Croitoru, MD  polyethylene glycol (MIRALAX / GLYCOLAX) packet Take 17 g by mouth daily as needed (constipation).   Yes Historical Provider, MD  pravastatin (PRAVACHOL) 40 MG tablet Take 40 mg by mouth daily. 09/20/14  Yes Historical Provider, MD  rOPINIRole (REQUIP) 0.25 MG tablet Take 0.25 mg by mouth at bedtime.    Yes Historical Provider, MD  torsemide (DEMADEX) 20 MG tablet Take 20 mg by mouth daily.    Yes Historical Provider, MD  traZODone (DESYREL) 50 MG tablet Take 50 mg by mouth at bedtime.   Yes Historical Provider, MD   Physical Exam: Filed Vitals:   12/03/14 1208 12/03/14 1256 12/03/14 1501 12/03/14 1627  BP: 122/73  139/73 131/66  Pulse: 70  70 69  Temp: 97.3 F (36.3 C)     TempSrc: Oral     Resp: 18  23 16   Height:  5\' 11"  (1.803 m)    Weight:  78.926 kg (174 lb)    SpO2: 98%  98% 99%    Wt Readings from Last 3 Encounters:  12/03/14 78.926 kg (174 lb)  11/22/14 81.647 kg (180 lb)  09/26/14 81.647 kg (180  lb)    General:  Appears calm and comfortable, somewhat frail Eyes: PERRL, normal lids, irises & conjunctiva ENT: grossly normal hearing, lips & tongue Neck: no LAD, masses or thyromegaly Cardiovascular: RRR, no m/r/g. No LE edema. Compression stockings bilateral lower extremities Telemetry: SR, no arrhythmias  Respiratory: CTA bilaterally, no w/r/r. Normal respiratory effort. Abdomen: soft, ntnd slightly distended positive bowel sounds no guarding or rebounding Skin: no rash or induration seen on limited exam Musculoskeletal: grossly normal tone BUE/BLE Psychiatric: grossly normal mood and affect, speech fluent and appropriate Neurologic: grossly non-focal. Speech clear facial symmetry           Labs on Admission:  Basic Metabolic Panel:  Recent Labs Lab 12/03/14 1238  NA 136  K 4.1  CL 98*  CO2 30  GLUCOSE 97  BUN 16  CREATININE 0.93  CALCIUM 9.2   Liver Function Tests:  Recent Labs Lab 12/03/14 1238  AST 32  ALT 32  ALKPHOS 70  BILITOT 0.6  PROT 7.1  ALBUMIN 3.6   No results for input(s): LIPASE, AMYLASE in the last 168 hours. No results for input(s): AMMONIA in the last 168 hours. CBC:  Recent Labs Lab 12/03/14 1238  WBC 7.6  HGB 14.0  HCT 42.9  MCV 99.1  PLT 228   Cardiac Enzymes:  Recent Labs Lab 12/03/14 1238  TROPONINI <0.03    BNP (last 3 results)  Recent Labs  01/29/14 2056 12/03/14 1253  BNP 50.1 66.8    ProBNP (last 3 results)  Recent Labs  08/11/14 1124  PROBNP 57.0    CBG: No results for input(s): GLUCAP in the last 168 hours.  Radiological Exams on Admission: Dg Chest Portable 1 View  12/03/2014  CLINICAL DATA:  Dizzy and lightheaded. EXAM: PORTABLE CHEST 1 VIEW COMPARISON:  08/11/2014, chest radiograph. FINDINGS: Dual lead cardiac pacemaker is stable. Postsurgical changes from CABG are also noted. Cardiomediastinal silhouette is stably enlarged. Mediastinal contours appear intact. Atherosclerotic calcifications  of the aorta are noted. There is a moderate in size hiatal hernia. There is no evidence of focal airspace consolidation, pleural effusion or pneumothorax. Osseous structures are without acute abnormality. Soft tissues are grossly normal. IMPRESSION: No evidence of acute cardiopulmonary process. Stable enlargement of the cardiac silhouette. Moderate in size hiatal hernia. Electronically Signed   By: Fidela Salisbury M.D.   On: 12/03/2014 13:33    EKG: Independently reviewed as noted above  Assessment/Plan Principal Problem:   SOB (shortness of breath): Likely multifactorial specifically mild orthostatic in the setting of some dehydration related to diuretics and recent diarrhea. He is not hypoxic. Chest x-ray without acute process. Will admit for observation will provide gentle IV fluids will hold his diuretics will recheck orthostatic vital signs in the morning will request physical therapy evaluation as well. Active Problems: Dizziness, after diuretic asscoiated with hypotension and responded to fluid bolus: Worsening over the last 4 days as noted above. Likely related to diuretics and mild dehydration secondary to diarrhea. Will admit to telemetry. Will provide very gentle IV hydration and hold his diuretics. Recheck orthostatics in the morning. Of note patient has a 4 year history of chronic dizziness. October 18 he was evaluated by neurology please see note in Epic. At that time some medication changes were made as well as rather recommendations regarding hydration and compression stockings. Very long discussion with patient about constipation versus diarrhea and proper hydration in the setting of diuretics.     CAD (coronary artery disease): No chest pain. However patient with fairly significant cardiac history. Patient and family concerned due to past history symptoms were similar and patient ended up with MI and needing a CABG. There are perception is that he has "MI was missed and therefore he  ended up needing to have surgery". EKG as noted above initial troponin negative Will cycle troponins obtain serial EKGs for completeness. Monitor on telemetry. Continue home medications      Cardiomyopathy, ischemic: Stable see above. I will hold his diuretics for now.    Pacemaker: Interrogated by [facility]. Jude that. No issues    Dehydration: Gentle IV fluids hold diuretics monitor urine output as well as stool output. Check orthostatics in the morning    Knee pain, acute: Right knee. Patient  developed the pain while in the emergency department. He has not ambulated he has not fallen. There is no erythema or she eats some mild swelling which I suspect is chronic. Await results of x-ray.     Code Status: full DVT Prophylaxis: Family Communication: daughter and wife at bedside Disposition Plan: home hopefully 24 hours  Time spent: 62 minutes  [facility]

## 2014-12-04 DIAGNOSIS — R0602 Shortness of breath: Secondary | ICD-10-CM

## 2014-12-04 LAB — TROPONIN I: Troponin I: <0.03 ng/mL (ref <0.031)

## 2014-12-04 MED ORDER — MELOXICAM 7.5 MG PO TABS
7.5000 mg | ORAL_TABLET | Freq: Every day | ORAL | Status: DC
  Administered 2014-12-04 – 2014-12-05 (×2): 7.5 mg via ORAL
  Filled 2014-12-04 (×2): qty 1

## 2014-12-04 MED ORDER — PRAVASTATIN SODIUM 40 MG PO TABS
40.0000 mg | ORAL_TABLET | Freq: Every day | ORAL | Status: DC
  Administered 2014-12-04: 40 mg via ORAL
  Filled 2014-12-04: qty 1

## 2014-12-04 MED ORDER — PRAVASTATIN SODIUM 40 MG PO TABS
40.0000 mg | ORAL_TABLET | Freq: Every day | ORAL | Status: DC
Start: 2014-12-04 — End: 2014-12-04
  Filled 2014-12-04: qty 1

## 2014-12-04 MED ORDER — PRAVASTATIN SODIUM 40 MG PO TABS: 40.0000 mg | ORAL_TABLET | Freq: Every day | ORAL | Status: DC

## 2014-12-04 MED ORDER — COLCHICINE 0.6 MG PO TABS
0.6000 mg | ORAL_TABLET | Freq: Every day | ORAL | Status: DC
Start: 2014-12-04 — End: 2014-12-05
  Administered 2014-12-04 – 2014-12-05 (×2): 0.6 mg via ORAL
  Filled 2014-12-04 (×2): qty 1

## 2014-12-04 MED ORDER — SODIUM CHLORIDE 0.9 % IV SOLN: INTRAVENOUS | Status: DC

## 2014-12-04 MED ORDER — MECLIZINE HCL 25 MG PO TABS: 12.5000 mg | ORAL_TABLET | Freq: Two times a day (BID) | ORAL | Status: DC | PRN

## 2014-12-04 NOTE — Plan of Care (Signed)
Problem: Phase I Progression Outcomes Goal: OOB as tolerated unless otherwise ordered Outcome: Not Progressing Pt up with assist unable to stand unassisted

## 2014-12-04 NOTE — Progress Notes (Signed)
[facility] PROGRESS NOTE  Adam [name] MVE:[id number] DOB: [birth year] DOA: 12/03/2014 PCP: Horatio Pel, MD  Assessment/Plan: Principal Problem:   Dehydration - Will gently hydrate and reassess - Continue to hold torsemide   Active Problems:   CAD (coronary artery disease) - continue plavix and aspirin - continue pravastatin  Dizziness - most likely related to # 1 - Will provide gentle IVF rehydration    Cardiomyopathy, ischemic - stable no chest pain reported    Knee pain, acute -suspecting pseudogout based on exam - place on meloxicam and colchicine - x ray results reviewed and reporting findings suggestive of calcium pyrophosphate deposition   Code Status: full Family Communication: discussed with wife and daughter Disposition Plan: With improvement in condition most likely d/c next am.   Consultants:  None  Procedures:  None  Antibiotics:  None  HPI/Subjective: Pt has no new complaints other than dizziness and discomfort at left knee  Objective: Filed Vitals:   12/04/14 0632  BP: 101/56  Pulse: 70  Temp: 98.5 F (36.9 C)  Resp: 18    Intake/Output Summary (Last 24 hours) at 12/04/14 1401 Last data filed at 12/04/14 0430  Gross per 24 hour  Intake      0 ml  Output    525 ml  Net   -525 ml   Filed Weights   12/03/14 1256 12/03/14 2100  Weight: 78.926 kg (174 lb) 79.289 kg (174 lb 12.8 oz)    Exam:   General:  Pt in nad, alert and awake  Cardiovascular: rrr, no mrg  Respiratory: cta bl, no wheezes  Abdomen: soft, NT, ND  Musculoskeletal: no cyanosis or clubbing, left knee swelling with no cellulitis    Data Reviewed: Basic Metabolic Panel:  Recent Labs Lab 12/03/14 1238  NA 136  K 4.1  CL 98*  CO2 30  GLUCOSE 97  BUN 16  CREATININE 0.93  CALCIUM 9.2   Liver Function Tests:  Recent Labs Lab 12/03/14 1238  AST 32  ALT 32  ALKPHOS 70  BILITOT 0.6  PROT 7.1  ALBUMIN 3.6   No results for  input(s): LIPASE, AMYLASE in the last 168 hours. No results for input(s): AMMONIA in the last 168 hours. CBC:  Recent Labs Lab 12/03/14 1238  WBC 7.6  HGB 14.0  HCT 42.9  MCV 99.1  PLT 228   Cardiac Enzymes:  Recent Labs Lab 12/03/14 1238 12/03/14 2023 12/04/14 0428  TROPONINI <0.03 <0.03 <0.03   BNP (last 3 results)  Recent Labs  01/29/14 2056 12/03/14 1253  BNP 50.1 66.8    ProBNP (last 3 results)  Recent Labs  08/11/14 1124  PROBNP 57.0    CBG: No results for input(s): GLUCAP in the last 168 hours.  No results found for this or any previous visit (from the past 240 hour(s)).   Studies: Dg Chest Portable 1 View  12/03/2014  CLINICAL DATA:  Dizzy and lightheaded. EXAM: PORTABLE CHEST 1 VIEW COMPARISON:  08/11/2014, chest radiograph. FINDINGS: Dual lead cardiac pacemaker is stable. Postsurgical changes from CABG are also noted. Cardiomediastinal silhouette is stably enlarged. Mediastinal contours appear intact. Atherosclerotic calcifications of the aorta are noted. There is a moderate in size hiatal hernia. There is no evidence of focal airspace consolidation, pleural effusion or pneumothorax. Osseous structures are without acute abnormality. Soft tissues are grossly normal. IMPRESSION: No evidence of acute cardiopulmonary process. Stable enlargement of the cardiac silhouette. Moderate in size hiatal hernia. Electronically Signed   By: Fidela Salisbury  M.D.   On: 12/03/2014 13:33   Dg Knee Complete 4 Views Left  12/03/2014  CLINICAL DATA:  Pain for 1 day.  No history of trauma EXAM: LEFT KNEE - COMPLETE 4+ VIEW COMPARISON:  None. FINDINGS: Frontal, bilateral oblique, and lateral views obtained. There is no demonstrable fracture or dislocation. There is a sizable joint effusion. There is severe narrowing of the patellofemoral joint. There is moderate narrowing medially. There is extensive chondrocalcinosis. No erosive change. There are scattered foci of arterial  vascular calcification. IMPRESSION: Sizable joint effusion. Marked narrowing of the patellofemoral joint. Extensive chondrocalcinosis. Moderate narrowing medially. These changes may be seen with osteoarthritis, a degree of osteoarthritis is present. However, this combination of findings is also suggestive of calcium pyrophosphate deposition disease which may present clinically as pseudogout. No fracture or dislocation apparent. Electronically Signed   By: Lowella Grip III M.D.   On: 12/03/2014 18:24    Scheduled Meds: . amiodarone  400 mg Oral Daily  . aspirin EC  81 mg Oral Daily  . clopidogrel  75 mg Oral Daily  . colchicine  0.6 mg Oral Daily  . docusate sodium  100 mg Oral BID  . famotidine  20 mg Oral QHS  . levothyroxine  100 mcg Oral QAC breakfast  . meloxicam  7.5 mg Oral Daily  . pantoprazole  40 mg Oral BID  . pravastatin  40 mg Oral Daily  . rOPINIRole  0.25 mg Oral QHS  . sodium chloride  3 mL Intravenous Q12H  . traZODone  50 mg Oral QHS   Continuous Infusions: . sodium chloride     Time spent: > 35 minutes  Velvet Bathe  [facility] Pager [phone] If 7PM-7AM, please contact night-coverage at [email or web address], password [facility] 12/04/2014, 2:01 PM

## 2014-12-04 NOTE — Plan of Care (Signed)
Problem: Phase I Progression Outcomes Goal: Pain controlled with appropriate interventions Outcome: Not Progressing Pt continues to c/o pain to his left knee. MD notified new orders rec'd

## 2014-12-05 ENCOUNTER — Telehealth: Payer: Self-pay | Admitting: Cardiovascular Disease

## 2014-12-05 ENCOUNTER — Telehealth: Payer: Self-pay | Admitting: Neurology

## 2014-12-05 DIAGNOSIS — R0602 Shortness of breath: Secondary | ICD-10-CM | POA: Diagnosis not present

## 2014-12-05 MED ORDER — TRAZODONE HCL 50 MG PO TABS: 25.0000 mg | ORAL_TABLET | Freq: Every evening | ORAL | Status: DC | PRN

## 2014-12-05 MED ORDER — MELOXICAM 7.5 MG PO TABS: 7.5000 mg | ORAL_TABLET | Freq: Every day | ORAL | Status: DC

## 2014-12-05 MED ORDER — AMIODARONE HCL 400 MG PO TABS: 400.0000 mg | ORAL_TABLET | Freq: Every day | ORAL | Status: DC

## 2014-12-05 MED ORDER — HYDROCODONE-ACETAMINOPHEN 5-325 MG PO TABS: 1.0000 | ORAL_TABLET | Freq: Four times a day (QID) | ORAL | Status: DC | PRN

## 2014-12-05 MED ORDER — TORSEMIDE 20 MG PO TABS: 20.0000 mg | ORAL_TABLET | Freq: Every day | ORAL | Status: DC

## 2014-12-05 MED ORDER — GABAPENTIN 100 MG PO CAPS: ORAL_CAPSULE | ORAL | Status: DC

## 2014-12-05 MED ORDER — COLCHICINE 0.6 MG PO TABS: 0.6000 mg | ORAL_TABLET | Freq: Every day | ORAL | Status: DC

## 2014-12-05 NOTE — Progress Notes (Signed)
Patients daughter called last pm concerned that the patient wouldn't tell staff "how bad his pain is" and that she was concerned he wouldn't be able to sleep due to the severity of pain.  Daughter inquired about increasing his anti-inflammatory medication.  I discussed with the patient/wife/and daughter that patient has PRN pain medication ordered and would benefit from taking it to assist with pain control.  We also briefly discussed the possibility of increased GI bleeding associated with long term/increased doses of anti-inflammatory medications.  After our discussion, pt was given 1vicodin per PRN order with effective pain control. Jessie Foot, RN

## 2014-12-05 NOTE — Progress Notes (Signed)
Pt had c/o of 9.5/10 pain on his Right upper chest area. He had also been c/o itchiness on his scalp area. It has apparently been mentioned that he has shingles ?  On the the Head causing intense itching. Apparently a PA from Dr. Pennie Banter office had mentioned it to him.

## 2014-12-05 NOTE — Discharge Summary (Addendum)
Physician Discharge Summary  Adam [name] PIR:[id number] DOB: Feb 19, 1926 DOA: 12/03/2014  PCP: Horatio Pel, MD  Admit date: 12/03/2014 Discharge date: 12/05/2014  Time spent: > 35 minutes  Recommendations for Outpatient Follow-up:  1. Monitor Serum creatinine 2. Improving on colchicine  Discharge Diagnoses:  Principal Problem:   SOB (shortness of breath) Active Problems:   CAD (coronary artery disease)   Dizziness, after diuretic asscoiated with hypotension and responded to fluid bolus   Cardiomyopathy, ischemic   Pacemaker   Dehydration   Knee pain, acute   Shortness of breath   Discharge Condition: full  Diet recommendation: heart healthy  Filed Weights   12/03/14 1256 12/03/14 2100 12/05/14 0442  Weight: 78.926 kg (174 lb) 79.289 kg (174 lb 12.8 oz) 78.563 kg (173 lb 3.2 oz)    History of present illness:  From original HPI: 81 y.o. male past medical history that includes CAD, CABG stents 2012, pacemaker, cardiomyopathy, orthostatic hypertension, GERD, 4 year history of dizziness presents to the emergency Department chief complaint of worsening dizziness. Initial evaluation reveals mild orthostatic hypotension and dehydration.  Hospital Course:  Principal Problem:  Dehydration - resolved after hydration - Patient may continue diuretic tomorrow (day after d/c), Has been advised to follow up with his pcp for further evaluation and recommendations.  Active Problems:  CAD (coronary artery disease) - continue plavix and aspirin - continue pravastatin  Dizziness - most likely related to # 1 - Will provide gentle IVF rehydration   Cardiomyopathy, ischemic - stable no chest pain reported   Knee pain, acute -suspecting pseudogout based on exam - improved on meloxicam and colchicine, will d/c on this regimen. - x ray results reviewed and reporting findings suggestive of calcium pyrophosphate  deposition   Procedures:  None  Consultations:  None  Discharge Exam: Filed Vitals:   12/05/14 0935  BP: 109/59  Pulse: 69  Temp: 97.6 F (36.4 C)  Resp: 18    General: Pt in nad, alert and awake Cardiovascular: rrr, no mrg Respiratory: cta bl, no wheezes  Discharge Instructions   Discharge Instructions    Call MD for:  difficulty breathing, headache or visual disturbances    Complete by:  As directed      Call MD for:  severe uncontrolled pain    Complete by:  As directed      Call MD for:  temperature >100.4    Complete by:  As directed      Diet - low sodium heart healthy    Complete by:  As directed      Discharge instructions    Complete by:  As directed   Please follow up with your primary care physician in 1-2 weeks or sooner should any new concerns arise.     Increase activity slowly    Complete by:  As directed           Current Discharge Medication List    START taking these medications   Details  colchicine 0.6 MG tablet Take 1 tablet (0.6 mg total) by mouth daily. Qty: 30 tablet, Refills: 0    HYDROcodone-acetaminophen (NORCO/VICODIN) 5-325 MG tablet Take 1 tablet by mouth every 6 (six) hours as needed for moderate pain. Qty: 30 tablet, Refills: 0    meloxicam (MOBIC) 7.5 MG tablet Take 1 tablet (7.5 mg total) by mouth daily. Qty: 15 tablet, Refills: 0      CONTINUE these medications which have CHANGED   Details  amiodarone (PACERONE) 400 MG tablet Take 1  tablet (400 mg total) by mouth daily. Qty: 30 tablet, Refills: 0    torsemide (DEMADEX) 20 MG tablet Take 1 tablet (20 mg total) by mouth daily.    traZODone (DESYREL) 50 MG tablet Take 0.5 tablets (25 mg total) by mouth at bedtime as needed for sleep.      CONTINUE these medications which have NOT CHANGED   Details  aspirin EC 81 MG EC tablet Take 1 tablet (81 mg total) by mouth daily.    Cholecalciferol (VITAMIN D3) 5000 UNITS CAPS Take 1 capsule by mouth once a week.      clobetasol ointment (TEMOVATE) 3.87 % Apply 1 application topically 2 (two) times daily.    clopidogrel (PLAVIX) 75 MG tablet Take 75 mg by mouth daily.    famotidine (PEPCID) 20 MG tablet Take 20 mg by mouth at bedtime.    levothyroxine (SYNTHROID, LEVOTHROID) 100 MCG tablet Take 100 mcg by mouth daily.    pantoprazole (PROTONIX) 40 MG tablet TAKE 1 TABLET BY MOUTH TWICE A DAY Qty: 60 tablet, Refills: 8    polyethylene glycol (MIRALAX / GLYCOLAX) packet Take 17 g by mouth daily as needed (constipation).    pravastatin (PRAVACHOL) 40 MG tablet Take 40 mg by mouth daily. Refills: 3    rOPINIRole (REQUIP) 0.25 MG tablet Take 0.25 mg by mouth at bedtime.        Allergies  Allergen Reactions  . Bactrim [Sulfamethoxazole-Trimethoprim]   . Crestor [Rosuvastatin Calcium]   . Altace [Ramipril] Other (See Comments)    Cough  . Penicillins Rash  . Sulfa Antibiotics Rash      The results of significant diagnostics from this hospitalization (including imaging, microbiology, ancillary and laboratory) are listed below for reference.    Significant Diagnostic Studies: Dg Chest Portable 1 View  12/03/2014  CLINICAL DATA:  Dizzy and lightheaded. EXAM: PORTABLE CHEST 1 VIEW COMPARISON:  08/11/2014, chest radiograph. FINDINGS: Dual lead cardiac pacemaker is stable. Postsurgical changes from CABG are also noted. Cardiomediastinal silhouette is stably enlarged. Mediastinal contours appear intact. Atherosclerotic calcifications of the aorta are noted. There is a moderate in size hiatal hernia. There is no evidence of focal airspace consolidation, pleural effusion or pneumothorax. Osseous structures are without acute abnormality. Soft tissues are grossly normal. IMPRESSION: No evidence of acute cardiopulmonary process. Stable enlargement of the cardiac silhouette. Moderate in size hiatal hernia. Electronically Signed   By: Fidela Salisbury M.D.   On: 12/03/2014 13:33   Dg Knee Complete 4 Views  Left  12/03/2014  CLINICAL DATA:  Pain for 1 day.  No history of trauma EXAM: LEFT KNEE - COMPLETE 4+ VIEW COMPARISON:  None. FINDINGS: Frontal, bilateral oblique, and lateral views obtained. There is no demonstrable fracture or dislocation. There is a sizable joint effusion. There is severe narrowing of the patellofemoral joint. There is moderate narrowing medially. There is extensive chondrocalcinosis. No erosive change. There are scattered foci of arterial vascular calcification. IMPRESSION: Sizable joint effusion. Marked narrowing of the patellofemoral joint. Extensive chondrocalcinosis. Moderate narrowing medially. These changes may be seen with osteoarthritis, a degree of osteoarthritis is present. However, this combination of findings is also suggestive of calcium pyrophosphate deposition disease which may present clinically as pseudogout. No fracture or dislocation apparent. Electronically Signed   By: Lowella Grip III M.D.   On: 12/03/2014 18:24    Microbiology: No results found for this or any previous visit (from the past 240 hour(s)).   Labs: Basic Metabolic Panel:  Recent Labs Lab 12/03/14 1238  NA 136  K 4.1  CL 98*  CO2 30  GLUCOSE 97  BUN 16  CREATININE 0.93  CALCIUM 9.2   Liver Function Tests:  Recent Labs Lab 12/03/14 1238  AST 32  ALT 32  ALKPHOS 70  BILITOT 0.6  PROT 7.1  ALBUMIN 3.6   No results for input(s): LIPASE, AMYLASE in the last 168 hours. No results for input(s): AMMONIA in the last 168 hours. CBC:  Recent Labs Lab 12/03/14 1238  WBC 7.6  HGB 14.0  HCT 42.9  MCV 99.1  PLT 228   Cardiac Enzymes:  Recent Labs Lab 12/03/14 1238 12/03/14 2023 12/04/14 0428  TROPONINI <0.03 <0.03 <0.03   BNP: BNP (last 3 results)  Recent Labs  01/29/14 2056 12/03/14 1253  BNP 50.1 66.8    ProBNP (last 3 results)  Recent Labs  08/11/14 1124  PROBNP 57.0    CBG: No results for input(s): GLUCAP in the last 168  hours.   Signed:  Velvet Bathe  [facility] 12/05/2014, 12:09 PM

## 2014-12-05 NOTE — Telephone Encounter (Signed)
Pt's wife called in on 10/29@10 :54am per answering service reporting that the pt's HR is 50 and he is very weak. Please call her backs to advise her on what to do.   Thanks

## 2014-12-05 NOTE — Telephone Encounter (Signed)
Patient's daughter Caryl Asp is calling as she understood Dr. Rexene Alberts would be calling in a Rx gabapentin for her father to [facility] [address].  Please call.

## 2014-12-05 NOTE — Telephone Encounter (Signed)
Last OV note says: Please taper off of ropinirole: take 1 pill each night for 1 week, then 1/2 pill each night for one week, then stop.  Once you are off of ropinirole, start low dose Neurontin (gabapentin) 100 mg strength: Take 1 pill nightly at bedtime for 1 week, then 2 pills nightly for 1 week, then 3 pills each night thereafter Rx has been added to med list and sent.  Receipt confirmed by pharmacy.  I called back to advise.  They are aware Rx has been sent, and verified Ropinirole has been d/c, last dose will be tonight.

## 2014-12-05 NOTE — Telephone Encounter (Signed)
Patient/wife called in and spoke with B. Samara Snide, [place] who recommended patient go to hospital. Patient is currently admitted.

## 2014-12-05 NOTE — Care Management Note (Signed)
Case Management Note  Patient Details  Name: Adam [name] MRN: [id number] Date of Birth: 06-16-1926  Subjective/Objective:  Pt admitted for SOB and Gout Flare Up. Pt is from home with wife.                   Action/Plan: CM did discuss disposition needs with pt. Pt is agreeable to [place] with [facility]. CM did make referral and SOC to begin on 12-06-14. DME RW and 3N1 to be dropped off by the room before d/c. RN to assist with new urinal. No further needs from CM at this time.    Expected Discharge Date:                  Expected Discharge Plan:  [place]  In-House Referral:  NA  Discharge planning Services  CM Consult  Post Acute Care Choice:  Durable Medical Equipment, Home Health Choice offered to:  Patient  DME Arranged:  3-N-1, [place] Rile DME Agency:  [place]:  PT [facility] Agency:  [place]  Status of Service:  Completed, signed off  Medicare Important Message Given:    Date Medicare IM Given:    Medicare IM give by:    Date Additional Medicare IM Given:    Additional Medicare Important Message give by:     If discussed at [place] of Stay Meetings, dates discussed:    Additional Comments:  Bethena Roys, RN 12/05/2014, 12:23 PM

## 2014-12-06 ENCOUNTER — Emergency Department (HOSPITAL_COMMUNITY): Payer: Medicare Other

## 2014-12-06 ENCOUNTER — Emergency Department (HOSPITAL_COMMUNITY)
Admission: EM | Admit: 2014-12-06 | Discharge: 2014-12-06 | Disposition: A | Discharge status: Home / Self Care | Payer: Medicare Other | Attending: Emergency Medicine | Admitting: Emergency Medicine

## 2014-12-06 ENCOUNTER — Encounter (HOSPITAL_COMMUNITY): Payer: Self-pay | Admitting: Emergency Medicine

## 2014-12-06 ENCOUNTER — Ambulatory Visit (INDEPENDENT_AMBULATORY_CARE_PROVIDER_SITE_OTHER): Payer: Medicare Other | Admitting: *Deleted

## 2014-12-06 DIAGNOSIS — Z9889 Other specified postprocedural states: Secondary | ICD-10-CM | POA: Diagnosis not present

## 2014-12-06 DIAGNOSIS — K5900 Constipation, unspecified: Secondary | ICD-10-CM | POA: Insufficient documentation

## 2014-12-06 DIAGNOSIS — Z955 Presence of coronary angioplasty implant and graft: Secondary | ICD-10-CM | POA: Diagnosis not present

## 2014-12-06 DIAGNOSIS — I252 Old myocardial infarction: Secondary | ICD-10-CM | POA: Insufficient documentation

## 2014-12-06 DIAGNOSIS — I2510 Atherosclerotic heart disease of native coronary artery without angina pectoris: Secondary | ICD-10-CM | POA: Diagnosis not present

## 2014-12-06 DIAGNOSIS — Z951 Presence of aortocoronary bypass graft: Secondary | ICD-10-CM | POA: Diagnosis not present

## 2014-12-06 DIAGNOSIS — I495 Sick sinus syndrome: Secondary | ICD-10-CM | POA: Diagnosis not present

## 2014-12-06 DIAGNOSIS — I10 Essential (primary) hypertension: Secondary | ICD-10-CM | POA: Insufficient documentation

## 2014-12-06 DIAGNOSIS — R5383 Other fatigue: Secondary | ICD-10-CM | POA: Diagnosis not present

## 2014-12-06 DIAGNOSIS — Z791 Long term (current) use of non-steroidal anti-inflammatories (NSAID): Secondary | ICD-10-CM | POA: Diagnosis not present

## 2014-12-06 DIAGNOSIS — I472 Ventricular tachycardia: Secondary | ICD-10-CM | POA: Diagnosis not present

## 2014-12-06 DIAGNOSIS — G4700 Insomnia, unspecified: Secondary | ICD-10-CM | POA: Diagnosis not present

## 2014-12-06 DIAGNOSIS — Z880 Allergy status to penicillin: Secondary | ICD-10-CM | POA: Insufficient documentation

## 2014-12-06 DIAGNOSIS — Z7952 Long term (current) use of systemic steroids: Secondary | ICD-10-CM | POA: Diagnosis not present

## 2014-12-06 DIAGNOSIS — Z85820 Personal history of malignant melanoma of skin: Secondary | ICD-10-CM | POA: Insufficient documentation

## 2014-12-06 DIAGNOSIS — R109 Unspecified abdominal pain: Secondary | ICD-10-CM | POA: Insufficient documentation

## 2014-12-06 DIAGNOSIS — Z7902 Long term (current) use of antithrombotics/antiplatelets: Secondary | ICD-10-CM | POA: Diagnosis not present

## 2014-12-06 DIAGNOSIS — M1990 Unspecified osteoarthritis, unspecified site: Secondary | ICD-10-CM | POA: Insufficient documentation

## 2014-12-06 DIAGNOSIS — E785 Hyperlipidemia, unspecified: Secondary | ICD-10-CM | POA: Insufficient documentation

## 2014-12-06 DIAGNOSIS — Z79899 Other long term (current) drug therapy: Secondary | ICD-10-CM | POA: Insufficient documentation

## 2014-12-06 DIAGNOSIS — Z7982 Long term (current) use of aspirin: Secondary | ICD-10-CM | POA: Insufficient documentation

## 2014-12-06 DIAGNOSIS — Z950 Presence of cardiac pacemaker: Secondary | ICD-10-CM | POA: Insufficient documentation

## 2014-12-06 DIAGNOSIS — E039 Hypothyroidism, unspecified: Secondary | ICD-10-CM | POA: Diagnosis not present

## 2014-12-06 DIAGNOSIS — K219 Gastro-esophageal reflux disease without esophagitis: Secondary | ICD-10-CM | POA: Insufficient documentation

## 2014-12-06 DIAGNOSIS — Z8619 Personal history of other infectious and parasitic diseases: Secondary | ICD-10-CM | POA: Insufficient documentation

## 2014-12-06 LAB — COMPREHENSIVE METABOLIC PANEL
ALT: 20 U/L (ref 17–63)
AST: 19 U/L (ref 15–41)
Albumin: 3.2 g/dL — ABNORMAL LOW (ref 3.5–5.0)
Alkaline Phosphatase: 61 U/L (ref 38–126)
Anion gap: 4 — ABNORMAL LOW (ref 5–15)
BUN: 15 mg/dL (ref 6–20)
CO2: 28 mmol/L (ref 22–32)
Calcium: 8.4 mg/dL — ABNORMAL LOW (ref 8.9–10.3)
Chloride: 105 mmol/L (ref 101–111)
Creatinine, Ser: 0.61 mg/dL (ref 0.61–1.24)
GFR calc Af Amer: >60 mL/min (ref >60)
GFR calc non Af Amer: >60 mL/min (ref >60)
Glucose, Bld: 103 mg/dL — ABNORMAL HIGH (ref 65–99)
Potassium: 4.2 mmol/L (ref 3.5–5.1)
Sodium: 137 mmol/L (ref 135–145)
Total Bilirubin: 0.5 mg/dL (ref 0.3–1.2)
Total Protein: 6.6 g/dL (ref 6.5–8.1)

## 2014-12-06 LAB — CBC WITH DIFFERENTIAL/PLATELET
Basophils Absolute: 0 10*3/uL (ref 0.0–0.1)
Basophils Relative: 0 %
Eosinophils Absolute: 0.1 10*3/uL (ref 0.0–0.7)
Eosinophils Relative: 2 %
HCT: 38.9 % — ABNORMAL LOW (ref 39.0–52.0)
Hemoglobin: 12.8 g/dL — ABNORMAL LOW (ref 13.0–17.0)
Lymphocytes Relative: 15 %
Lymphs Abs: 1 10*3/uL (ref 0.7–4.0)
MCH: 32.3 pg (ref 26.0–34.0)
MCHC: 32.9 g/dL (ref 30.0–36.0)
MCV: 98.2 fL (ref 78.0–100.0)
Monocytes Absolute: 0.7 10*3/uL (ref 0.1–1.0)
Monocytes Relative: 11 %
Neutro Abs: 5.1 10*3/uL (ref 1.7–7.7)
Neutrophils Relative %: 72 %
Platelets: 204 10*3/uL (ref 150–400)
RBC: 3.96 MIL/uL — ABNORMAL LOW (ref 4.22–5.81)
RDW: 14.5 % (ref 11.5–15.5)
WBC: 7 10*3/uL (ref 4.0–10.5)

## 2014-12-06 LAB — LIPASE, BLOOD: Lipase: 22 U/L (ref 11–51)

## 2014-12-06 LAB — URINALYSIS, ROUTINE W REFLEX MICROSCOPIC
Bilirubin Urine: NEGATIVE
Glucose, UA: NEGATIVE mg/dL
Hgb urine dipstick: NEGATIVE
Ketones, ur: NEGATIVE mg/dL
Leukocytes, UA: NEGATIVE
Nitrite: NEGATIVE
Protein, ur: NEGATIVE mg/dL
Specific Gravity, Urine: 1.012 (ref 1.005–1.030)
Urobilinogen, UA: 1 mg/dL (ref 0.0–1.0)
pH: 7.5 (ref 5.0–8.0)

## 2014-12-06 LAB — I-STAT CG4 LACTIC ACID, ED
Lactic Acid, Venous: 0.62 mmol/L (ref 0.5–2.0)
Lactic Acid, Venous: 1.08 mmol/L (ref 0.5–2.0)

## 2014-12-06 MED ORDER — NYSTATIN 100000 UNIT/GM EX POWD: CUTANEOUS | Status: DC

## 2014-12-06 MED ORDER — ALPRAZOLAM 0.5 MG PO TABS: 0.5000 mg | ORAL_TABLET | Freq: Every evening | ORAL | Status: DC | PRN

## 2014-12-06 NOTE — Discharge Instructions (Signed)
Make sure you follow up as directed.  Take miralax at home for constipation.  Apply the powder to your genital area to help with the sticking and redness.  Be careful taking the Xanax for sleep as this can make you feel weird and do not take more than prescribed.    Constipation, Adult Constipation is when a person has fewer than three bowel movements a week, has difficulty having a bowel movement, or has stools that are dry, hard, or larger than normal. As people grow older, constipation is more common. A low-fiber diet, not taking in enough fluids, and taking certain medicines may make constipation worse.  CAUSES   Certain medicines, such as antidepressants, pain medicine, iron supplements, antacids, and water pills.   Certain diseases, such as diabetes, irritable bowel syndrome (IBS), thyroid disease, or depression.   Not drinking enough water.   Not eating enough fiber-rich foods.   Stress or travel.   Lack of physical activity or exercise.   Ignoring the urge to have a bowel movement.   Using laxatives too much.  SIGNS AND SYMPTOMS   Having fewer than three bowel movements a week.   Straining to have a bowel movement.   Having stools that are hard, dry, or larger than normal.   Feeling full or bloated.   Pain in the lower abdomen.   Not feeling relief after having a bowel movement.  DIAGNOSIS  Your health care provider will take a medical history and perform a physical exam. Further testing may be done for severe constipation. Some tests may include:  A barium enema X-ray to examine your rectum, colon, and, sometimes, your small intestine.   A sigmoidoscopy to examine your lower colon.   A colonoscopy to examine your entire colon. TREATMENT  Treatment will depend on the severity of your constipation and what is causing it. Some dietary treatments include drinking more fluids and eating more fiber-rich foods. Lifestyle treatments may include regular  exercise. If these diet and lifestyle recommendations do not help, your health care provider may recommend taking over-the-counter laxative medicines to help you have bowel movements. Prescription medicines may be prescribed if over-the-counter medicines do not work.  HOME CARE INSTRUCTIONS   Eat foods that have a lot of fiber, such as fruits, vegetables, whole grains, and beans.  Limit foods high in fat and processed sugars, such as french fries, hamburgers, cookies, candies, and soda.   A fiber supplement may be added to your diet if you cannot get enough fiber from foods.   Drink enough fluids to keep your urine clear or pale yellow.   Exercise regularly or as directed by your health care provider.   Go to the restroom when you have the urge to go. Do not hold it.   Only take over-the-counter or prescription medicines as directed by your health care provider. Do not take other medicines for constipation without talking to your health care provider first.  [place] IF:   You have bright red blood in your stool.   Your constipation lasts for more than 4 days or gets worse.   You have abdominal or rectal pain.   You have thin, pencil-like stools.   You have unexplained weight loss. MAKE SURE YOU:   Understand these instructions.  Will watch your condition.  Will get help right away if you are not doing well or get worse.   This information is not intended to replace advice given to you by your health  care provider. Make sure you discuss any questions you have with your health care provider.   Document Released: 10/20/2003 Document Revised: 02/11/2014 Document Reviewed: 11/02/2012 Elsevier Interactive Patient Education [employer].

## 2014-12-06 NOTE — Progress Notes (Signed)
81 yr old united health care medicare pt /c from [facility] on 12/05/14 home with 3n1, walker & HHPT that was scheduled to see him 12/06/14 at 1430 but pt at [facility] ED  Cm consulted by ED SW after speaking with pt and family  CM spoke with pt and family after discussing addition of HHRN, aide and SW with EDP, [place]fonse Spruce and [place] of advanced home care CM reviewed in details medicare guidelines, home health [facility]) (length of stay in home, types of [facility] staff available, coverage, primary caregiver, up to 24 hrs before services may be started) and Private duty nursing (PDN-coverage, length of stay in the home types of staff available). CM reviewed availability of [place] SW to assist pcp to get pt to snf (if desired disposition) from the community level. CM provided pt/family with a list of [place] home health agencies, PDN and Snfs (sheet to wife and daughter).

## 2014-12-06 NOTE — ED Provider Notes (Signed)
CSN: [id number]     Arrival date & time 12/06/14  1111 History   First MD Initiated Contact with Patient 12/06/14 1123     Chief Complaint  Patient presents with  . Constipation     (Consider location/radiation/quality/duration/timing/severity/associated sxs/prior Treatment) HPI Comments: 81 y.o. Male with history of CAD, CHF, recent slow decline in health presents for constipation.  The patient is presenting from home.  He was discharged from the hospital yesterday after being admitted for dehydration.  He says that he has issues with constipation but that this has been worse since being admitted to the hospital.  Today he tried to take laxatives to help move his bowels and then had sudden severe lower abdominal pain but was unable to have a bowel movement.  On arrival to the ER the patient passed a large bowel movement with improvement in his symptoms.     Past Medical History  Diagnosis Date  . CAD (coronary artery disease)     CABG (LIMA-LAD, SVG-RCA, SVG-OM in 1996).  07/2009 BMS to SVG-RCA. Cath in 04/2010 with patent stents   . Bradycardia     AFib/SSS s/p St Jude PPM 04/12/2008  . Hypothyroid   . Pacemaker   . Hyperlipidemia   . GERD (gastroesophageal reflux disease)   . Hiatal hernia   . Insomnia   . S/P CABG x 4   . RBBB   . Melanoma of back ([place]) 1976  . Heart murmur     "just told I had one today" (12/15/2012)  . Myocardial infarction [facility]) 1996; 2011    "both silent" (12/15/2012)  . Exertional shortness of breath     "sometimes walking" (12/15/2012)  . Arthritis     "minor, back and sometimes knees" (12/15/2012)  . Sustained ventricular tachycardia ([place]) 07/27/2014  . Hypertension   . [place] spotted fever    Past Surgical History  Procedure Laterality Date  . US echocardiography  07/11/2009    EF 45-50%  . Nm myoview ltd  06/2011    low risk  . Coronary artery bypass graft  1996    LIMA to LAD,SVG to RCA & SVG to OM  . Tonsillectomy  1938  . Appendectomy   1942  . Coronary angioplasty with stent placement  07/2009    bare metal stent to SVG to the RCA  . Cardiac catheterization  04/2010    LIMA to LAD patent,SVG to OM patent,no in-stnet restenosis RCA  . Cataract extraction w/ intraocular lens  implant, bilateral Bilateral 2012  . Melanoma excision  05/1974 X2    "taken off my back" (12/15/2012)  . Transurethral resection of prostate  1986  . Insert / replace / remove pacemaker  2010   Family History  Problem Relation Age of Onset  . Coronary artery disease Mother   . Diabetes Mother   . Heart disease Mother   . Coronary artery disease Father   . Diabetes Father   . Lung cancer Father   . Heart disease Father    Social History  Substance Use Topics  . Smoking status: Never Smoker   . Smokeless tobacco: Never Used  . Alcohol Use: No    Review of Systems  Constitutional: Positive for fatigue. Negative for fever, diaphoresis and appetite change.  HENT: Negative for congestion and rhinorrhea.   Eyes: Negative for visual disturbance.  Respiratory: Negative for cough, chest tightness and shortness of breath.   Cardiovascular: Negative for chest pain and palpitations.  Gastrointestinal: Positive for  abdominal pain and constipation. Negative for nausea, vomiting and diarrhea.  Genitourinary: Negative for dysuria, urgency and hematuria.  Musculoskeletal: Negative for myalgias and back pain.  Skin: Negative for rash.  Neurological: Negative for dizziness and headaches.  Hematological: Does not bruise/bleed easily.      Allergies  Crestor; Altace; Bactrim; Penicillins; and Sulfa antibiotics  Home Medications   Prior to Admission medications   Medication Sig Start Date End Date Taking? Authorizing Provider  amiodarone (PACERONE) 400 MG tablet Take 1 tablet (400 mg total) by mouth daily. 12/05/14  Yes Velvet Bathe, MD  aspirin EC 81 MG EC tablet Take 1 tablet (81 mg total) by mouth daily. 12/16/12  Yes Isaiah Serge, NP  bisacodyl  (DULCOLAX) 5 MG EC tablet Take 5 mg by mouth daily as needed for moderate constipation.   Yes Historical Provider, MD  Cholecalciferol (VITAMIN D3) 5000 UNITS CAPS Take 1 capsule by mouth every Monday.    Yes Historical Provider, MD  clobetasol ointment (TEMOVATE) 9.41 % Apply 1 application topically 2 (two) times daily.   Yes Historical Provider, MD  clopidogrel (PLAVIX) 75 MG tablet Take 75 mg by mouth daily.   Yes Historical Provider, MD  colchicine 0.6 MG tablet Take 1 tablet (0.6 mg total) by mouth daily. 12/05/14  Yes Velvet Bathe, MD  famotidine (PEPCID) 20 MG tablet Take 20 mg by mouth at bedtime.   Yes Historical Provider, MD  HYDROcodone-acetaminophen (NORCO/VICODIN) 5-325 MG tablet Take 1 tablet by mouth every 6 (six) hours as needed for moderate pain. 12/05/14  Yes Velvet Bathe, MD  levothyroxine (SYNTHROID, LEVOTHROID) 112 MCG tablet Take 112 mcg by mouth daily before breakfast.   Yes Historical Provider, MD  magnesium citrate SOLN Take 0.5 Bottles by mouth once.   Yes Historical Provider, MD  meloxicam (MOBIC) 7.5 MG tablet Take 1 tablet (7.5 mg total) by mouth daily. 12/05/14  Yes Velvet Bathe, MD  pantoprazole (PROTONIX) 40 MG tablet TAKE 1 TABLET BY MOUTH TWICE A DAY 02/28/14  Yes Mihai Croitoru, MD  polyethylene glycol (MIRALAX / GLYCOLAX) packet Take 17 g by mouth daily as needed (constipation).   Yes Historical Provider, MD  pravastatin (PRAVACHOL) 40 MG tablet Take 40 mg by mouth daily. 09/20/14  Yes Historical Provider, MD  rOPINIRole (REQUIP) 0.25 MG tablet Take 0.25 mg by mouth at bedtime.    Yes Historical Provider, MD  torsemide (DEMADEX) 20 MG tablet Take 1 tablet (20 mg total) by mouth daily. 12/06/14  Yes Velvet Bathe, MD  traZODone (DESYREL) 50 MG tablet Take 0.5 tablets (25 mg total) by mouth at bedtime as needed for sleep. 12/05/14  Yes Velvet Bathe, MD  gabapentin (NEURONTIN) 100 MG capsule Take 1 pill nightly at bedtime for 1 week, then 2 pills nightly for 1 week, then  3 pills each night thereafter Patient not taking: Reported on 12/06/2014 12/05/14   Star Age, MD   BP 129/72 mmHg  Pulse 72  Temp(Src) 97.9 F (36.6 C) (Oral)  Resp 16  SpO2 100% Physical Exam  Constitutional: He is oriented to person, place, and time. No distress.  HENT:  Head: Normocephalic and atraumatic.  Right Ear: External ear normal.  Left Ear: External ear normal.  Mouth/Throat: Oropharynx is clear and moist.  Eyes: EOM are normal. Pupils are equal, round, and reactive to light.  Neck: Normal range of motion. Neck supple.  Cardiovascular: Normal rate, regular rhythm and intact distal pulses.   Pulmonary/Chest: Effort normal. No respiratory distress. He has no  wheezes. He has no rales.  Abdominal: Soft. He exhibits distension. He exhibits no mass. There is no tenderness. There is no rebound and no guarding. Hernia confirmed negative in the right inguinal area and confirmed negative in the left inguinal area.  Genitourinary: Testes normal and penis normal.  Irritation of skin of bilateral scrotum  Musculoskeletal: Normal range of motion. He exhibits no edema.  Neurological: He is alert and oriented to person, place, and time.  Skin: Skin is warm and dry. No rash noted. He is not diaphoretic.  Vitals reviewed.   ED Course  Procedures (including critical care time) Labs Review Labs Reviewed  CBC WITH DIFFERENTIAL/PLATELET - Abnormal; Notable for the following:    RBC 3.96 (*)    Hemoglobin 12.8 (*)    HCT 38.9 (*)    All other components within normal limits  COMPREHENSIVE METABOLIC PANEL - Abnormal; Notable for the following:    Glucose, Bld 103 (*)    Calcium 8.4 (*)    Albumin 3.2 (*)    Anion gap 4 (*)    All other components within normal limits  LIPASE, BLOOD  URINALYSIS, ROUTINE W REFLEX MICROSCOPIC (NOT AT [facility])  I-STAT CG4 LACTIC ACID, ED  I-STAT CG4 LACTIC ACID, ED    Imaging Review Dg Abd Acute W/chest  12/06/2014  CLINICAL DATA:  Abdominal pain.   Constipation. EXAM: DG ABDOMEN ACUTE W/ 1V CHEST COMPARISON:  Radiographs dated 12/03/2014 and 09/08/2014 FINDINGS: Heart and lungs are clear. Large hiatal hernia. No free air in the abdomen. Air scattered throughout nondistended loops of large and small bowel. Moderate stool in the rectum. There is suggestion of compression fractures of two the lumbar vertebra but I suspect these are old. IMPRESSION: Benign appearing abdomen and chest. Probable old compression fractures in the lumbar spine. Electronically Signed   By: [name] M.D.   On: 12/06/2014 13:32   I have personally reviewed and evaluated these images and lab results as part of my medical decision-making.   EKG Interpretation None      MDM  Patient seen and evaluated in stable condition.  Patient with improvement after bowel movement.  Benign examination with irritation of scrotal skin.  Laboratory studies unremarkable/improved.  AAS unremarkable.  Patient and family very anxious about living situation and ability to be cared for at home as he has steadily and slowly required more and more help with daily activities.  Patient seen by social work and case management.  Arrangements made for mor assistance at home with placement as unable to place patient in a facility from ER.  Patient and family expressed understanding and agreement with plan of care. Final diagnoses:  None    1. Constipation      Harvel Quale, MD 12/06/14 [phone]

## 2014-12-06 NOTE — ED Notes (Signed)
Bed: WA19 Expected date:  Expected time:  Means of arrival:  Comments: 

## 2014-12-06 NOTE — ED Notes (Signed)
Pt had second large bowel movement

## 2014-12-06 NOTE — ED Notes (Signed)
Pt has had 3rd large BM

## 2014-12-06 NOTE — ED Notes (Signed)
Pt had large BM upon arrival to ED room

## 2014-12-06 NOTE — ED Notes (Signed)
Pt denies fall or injury to clavicle, knee or hip.

## 2014-12-06 NOTE — Progress Notes (Signed)
CSW met with patient at bedside. Family was present.  Patient confirms that he presents to [name] due to constipation. Daughter stated " He is actually here because he was in pain this morning. It could be a UTI or prostate.  Patient informed CSW that he ambulates using a walker. Patient states that he lives at home with his wife in [place]. Daughter informed CSW that the patient was scheduled to have [facility] out today. However, she states that the agency did not come due to the patient being here at [name].  Family expressed that they are interested in a SNF for placement. CSW consulted with Nurse CM and made her aware. She states that she can request a Education officer, museum to home health plan. CSW gave Nurse CM a list of SNF, she states she will give list to family.  Patient has a good support system that consist of his wife and daughter.   Wife/ [name] [phone] Daughter/[name] [phone]  Vondell, [place] [phone] ED CSW 12/06/2014 4:24 PM

## 2014-12-06 NOTE — ED Notes (Signed)
[facility] contacted again.  They are backed up.  Pt is 3rd on the list at this time.

## 2014-12-06 NOTE — ED Notes (Signed)
Pt is from home.  Reports constipation X 4 days.  Pt tried some OTC meds at home. Seen in ED on the 29th for dehydration.  BP  148/84 p 70 Paced RR16  Rates pain 8 of 10.

## 2014-12-06 NOTE — ED Notes (Signed)
[facility] notified for Pt transport

## 2014-12-07 NOTE — Progress Notes (Signed)
Remote pacemaker transmission.   

## 2014-12-08 ENCOUNTER — Ambulatory Visit: Payer: Medicare Other | Admitting: Neurology

## 2014-12-12 ENCOUNTER — Ambulatory Visit (INDEPENDENT_AMBULATORY_CARE_PROVIDER_SITE_OTHER): Payer: Medicare Other | Admitting: Physician Assistant

## 2014-12-12 ENCOUNTER — Encounter: Payer: Self-pay | Admitting: Physician Assistant

## 2014-12-12 ENCOUNTER — Other Ambulatory Visit: Payer: Self-pay | Admitting: Cardiovascular Disease

## 2014-12-12 VITALS — BP 106/64 | HR 76 | Ht 71.0 in | Wt 179.0 lb

## 2014-12-12 DIAGNOSIS — I951 Orthostatic hypotension: Secondary | ICD-10-CM | POA: Diagnosis not present

## 2014-12-12 DIAGNOSIS — I519 Heart disease, unspecified: Secondary | ICD-10-CM | POA: Diagnosis not present

## 2014-12-12 MED ORDER — MIDODRINE HCL 2.5 MG PO TABS: 2.5000 mg | ORAL_TABLET | Freq: Three times a day (TID) | ORAL | Status: DC

## 2014-12-12 NOTE — Progress Notes (Signed)
Cardiology Office Note   Date:  12/12/2014   ID:  [name], DOB 05-Jun-1926, MRN [id number]  PCP:  Horatio Pel, MD  Cardiologist:  Dr Juanetta Snow, PA-C   Chief Complaint  Patient presents with  . Medication Management    start Entresto? some medications may be contributing to the dizziness.  . Dizziness    "extreme" sent him to the hospital  . Shortness of Breath    on exertion - doesn't matter with heavy or light exertion    History of Present Illness: [name] is a 81 y.o. 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  [name] presents for further evaluation of his dizziness. He has had problems with orthostatic hypotension in the past. He has regular episodes of dizziness, some of them start sitting at rest. They're worse when he gets up and tries to walk. Interestingly, he has no symptoms when he is using the exercise bike.  He gets lower extremity edema, wakes up with it and he gets worse during the day at times. He is concerned about this as well. He also has restless legs and was taking Requip. However, because of the lower extremity edema, he was taken off the Requip and started on gabapentin. He has not yet started the gabapentin, wishing to discuss it with Dr. Shelia Media first.  Of note, other issues he is dealing with include nocturia 4-5 times per night and off feeling of being unable to empty his bladder well.  He is here with his wife and daughter today. They had a list of questions and these were dressed in writing. A copy of these was kept and should be under scanned documents.   Past Medical History  Diagnosis Date  . CAD (coronary artery disease)     CABG (LIMA-LAD, SVG-RCA, SVG-OM in 1996).  07/2009 BMS to SVG-RCA. Cath in 04/2010 with patent stents   . Bradycardia     AFib/SSS s/p St Jude PPM 04/12/2008  . Hypothyroid   . Pacemaker   . Hyperlipidemia   . GERD (gastroesophageal  reflux disease)   . Hiatal hernia   . Insomnia   . S/P CABG x 4   . RBBB   . Melanoma of back ([place]) 1976  . Heart murmur     "just told I had one today" (12/15/2012)  . Myocardial infarction [facility]) 1996; 2011    "both silent" (12/15/2012)  . Exertional shortness of breath     "sometimes walking" (12/15/2012)  . Arthritis     "minor, back and sometimes knees" (12/15/2012)  . Sustained ventricular tachycardia ([place]) 07/27/2014  . Hypertension   . [place] spotted fever     Past Surgical History  Procedure Laterality Date  . US echocardiography  07/11/2009    EF 45-50%  . Nm myoview ltd  06/2011    low risk  . Coronary artery bypass graft  1996    LIMA to LAD,SVG to RCA & SVG to OM  . Tonsillectomy  1938  . Appendectomy  1942  . Coronary angioplasty with stent placement  07/2009    bare metal stent to SVG to the RCA  . Cardiac catheterization  04/2010    LIMA to LAD patent,SVG to OM patent,no in-stnet restenosis RCA  . Cataract extraction w/ intraocular lens  implant, bilateral Bilateral 2012  . Melanoma excision  05/1974 X2    "taken off  my back" (12/15/2012)  . Transurethral resection of prostate  1986  . Insert / replace / remove pacemaker  2010    Current Outpatient Prescriptions  Medication Sig Dispense Refill  . ALPRAZolam (XANAX) 0.5 MG tablet Take 1 tablet (0.5 mg total) by mouth at bedtime as needed for anxiety or sleep. 10 tablet 0  . amiodarone (PACERONE) 400 MG tablet Take 1 tablet (400 mg total) by mouth daily. 30 tablet 0  . aspirin EC 81 MG EC tablet Take 1 tablet (81 mg total) by mouth daily.    . bisacodyl (DULCOLAX) 5 MG EC tablet Take 5 mg by mouth daily as needed for moderate constipation.    . Cholecalciferol (VITAMIN D3) 5000 UNITS CAPS Take 1 capsule by mouth every Monday.     . clobetasol ointment (TEMOVATE) 3.15 % Apply 1 application topically 2 (two) times daily.    . clopidogrel (PLAVIX) 75 MG tablet Take 75 mg by mouth daily.    . colchicine 0.6  MG tablet Take 1 tablet (0.6 mg total) by mouth daily. 30 tablet 0  . famotidine (PEPCID) 20 MG tablet Take 20 mg by mouth at bedtime.    . gabapentin (NEURONTIN) 100 MG capsule Take 1 pill nightly at bedtime for 1 week, then 2 pills nightly for 1 week, then 3 pills each night thereafter (Patient not taking: Reported on 12/06/2014) 90 capsule 3  . HYDROcodone-acetaminophen (NORCO/VICODIN) 5-325 MG tablet Take 1 tablet by mouth every 6 (six) hours as needed for moderate pain. 30 tablet 0  . levothyroxine (SYNTHROID, LEVOTHROID) 112 MCG tablet Take 112 mcg by mouth daily before breakfast.    . magnesium citrate SOLN Take 0.5 Bottles by mouth once.    . meloxicam (MOBIC) 7.5 MG tablet Take 1 tablet (7.5 mg total) by mouth daily. 15 tablet 0  . nystatin (MYCOSTATIN/NYSTOP) 100000 UNIT/GM POWD Apply twice daily to your scrotum and genital area to cover the area. 15 g 0  . pantoprazole (PROTONIX) 40 MG tablet TAKE 1 TABLET BY MOUTH TWICE A DAY 60 tablet 8  . polyethylene glycol (MIRALAX / GLYCOLAX) packet Take 17 g by mouth daily as needed (constipation).    . pravastatin (PRAVACHOL) 40 MG tablet Take 40 mg by mouth daily.  3  . rOPINIRole (REQUIP) 0.25 MG tablet Take 0.25 mg by mouth at bedtime.     . torsemide (DEMADEX) 20 MG tablet Take 1 tablet (20 mg total) by mouth daily.    . traZODone (DESYREL) 50 MG tablet Take 0.5 tablets (25 mg total) by mouth at bedtime as needed for sleep.      Allergies:   Crestor; Altace; Bactrim; Penicillins; and Sulfa antibiotics  Social History:  The patient  reports that he has never smoked. He has never used smokeless tobacco. He reports that he does not drink alcohol or use illicit drugs.   Family History:  The patient's family history includes Coronary artery disease in his father and mother; Diabetes in his father and mother; Heart disease in his father and mother; Lung cancer in his father.    ROS:  Please see the history of present illness. All other systems  are reviewed and negative.    PHYSICAL EXAM: VS:  BP 106/64 mmHg  Pulse 76  Ht 5\' 11"  (1.803 m)  Wt 179 lb (81.194 kg)  BMI 24.98 kg/m2 , BMI Body mass index is 24.98 kg/(m^2). GEN: Well nourished, well developed, male in no acute distress HEENT: normal for age  Neck:  no JVD, no carotid bruit, no masses Cardiac: RRR; soft murmur, no rubs, or gallops Respiratory:  clear to auscultation bilaterally, normal work of breathing GI: soft, nontender, nondistended, + BS MS: no deformity or atrophy; trace-1+ edema; distal pulses are 2+ in all 4 extremities  Skin: warm and dry, no rash Neuro:  Strength and sensation are intact Psych: euthymic mood, full affect   EKG:  EKG is not ordered today.  Recent Labs: 07/26/2014: Magnesium 2.4 08/11/2014: Pro B Natriuretic peptide (BNP) 57.0 09/06/2014: TSH 3.820 12/03/2014: B Natriuretic Peptide 66.8 12/06/2014: ALT 20; BUN 15; Creatinine, Ser 0.61; Hemoglobin 12.8*; Platelets 204; Potassium 4.2; Sodium 137     Wt Readings from Last 3 Encounters:  12/12/14 179 lb (81.194 kg)  12/05/14 173 lb 3.2 oz (78.563 kg)  11/22/14 180 lb (81.647 kg)     Other studies Reviewed: Additional studies/ records that were reviewed today include: Office records, hospital records.  ASSESSMENT AND PLAN:  1.  Orthostatic hypotension: His orthostatic vital signs are strongly positive today with a blood pressure that went from 123/76 and a heart rate of 70 down to 97/59 with a heart rate of 71. He was symptomatic with this. Of note is his lack of symptoms when he is exerting himself.  This is suspicious for autonomic dysfunction. He does not appear to be dehydrated. According to his family, when he was hospitalized and getting IV fluids, he felt the best he has ever felt. At home, he does not use sodium because his wife is on a low-sodium diet. He is also careful about how much he drinks and was concerned that he needed to cut back in order to decrease his edema.  I  discussed the case with Dr. Sallyanne Kuster, then had a long discussion with the patient and his family. We may have to live with some lower extremity edema in order to minimize the orthostatic hypotension.   Therefore, he will liberalize fluid and salt intake. As he is on a low-sodium diet because of his wife, he will have items such as saltine crackers that he can eat in order to get some salt. We will add midodrine 2.5 mg 3 times a day.   He is encouraged to check his blood pressure when he gets dizzy. He is to continue to track his weight and monitor his fluid intake. If his symptoms do not significantly improve, he may need an EP referral.  2. Left ventricular dysfunction: He is to hold the torsemide for now. Hopefully we will be able to improve his symptoms without a high degree of volume overload. He is to let us know if he gets increasing dyspnea on exertion or a significant increase in his edema.  3. Possible urinary retention: He has problems starting urine stream even though his bladder feels very full and also describes feeling that he cannot completely empty his bladder. He has not. At least 4 times a night. He is to discuss this with Dr. Shelia Media and consider urology referral.  Current medicines are reviewed at length with the patient today.  The patient does not have concerns regarding medicines.  The following changes have been made:  Admitted drain, hold torsemide, recommend continuing Requip instead of adding gabapentin for restless legs.  Labs/ tests ordered today include:  No orders of the defined types were placed in this encounter.     Disposition:   FU with Dr. Sallyanne Kuster  Signed, Rosaria Ferries, PA-C  12/12/2014 12:06 PM    [facility] [place], [place], [place]  [address] Phone: [phone]; Fax: [phone]

## 2014-12-12 NOTE — Patient Instructions (Signed)
Rhonda Barrett, PA-C, has recommended making the following medication changes: HOLD Torsemide, for now START Midodrine 2.5 mg - take 1 tablet by mouth 3 times daily. Do not lay flat after taking this medication. **PLEASE TAKE THE THIRD DOSE AT LEAST 6 HOURS BEFORE BEDTIME.  Suanne Marker recommends that you purchase compression stockings. These may be purchased at any medical supply store or even some pharmacies.  When your ankles swell, try elevating your legs. When dizziness occurs with sitting, drink some water and eat crackers.  Rosaria Ferries, PA-C, recommends that you schedule a follow-up appointment with Dr Sallyanne Kuster first available.   **AS FAR AS GABAPENTIN IS CONCERNED, CARDIOLOGY RECOMMENDS REQUIP OVER NEURONTIN - DUE TO DIZZINESS.

## 2014-12-14 ENCOUNTER — Telehealth: Payer: Self-pay | Admitting: Cardiovascular Disease

## 2014-12-14 NOTE — Telephone Encounter (Signed)
Understood, no change in plans. Please remind him that medication will raise BP both when upright and when lying flat. Do not lie flat in 4 hour period after taking med (he can recline, just not horizontal). There is room to increase the midodrine if needed, but with those BP readings I would leave as is for now.

## 2014-12-14 NOTE — Telephone Encounter (Signed)
Spoke to [place], [facility] physical therapist, who saw patient today.  She reports pt felt dizzy and/or lightheaded on standing again today, acknowledges this has been current concern recently addressed in office.  She reported BP findings - 130/68 sitting, 120/66 standing. HRs in 60s for each reading. Reports apparent weight gain of 5 lbs by comparison from Monday reading.  Pt not short of breath. Pt does have reported 2+ edema to ankles.  No concern for orthostatic changes.   We discussed med recommendations, (midodrine 2.5mg  TID) in detail. She is not sure pt taking med as indicated.  Advised ensuring pt is compliant w/ med changes, call if further problems.  She states HHRN will be out to see pt tomorrow - I requested update then.  I attempted to call patient, no answer at home when dialed.  Routing to Dr. Sallyanne Kuster for recommendations.

## 2014-12-14 NOTE — Telephone Encounter (Signed)
Juliann Pulse PT ( [place] ) is calling because Mr. [name] has had a 5lb weight gain since yesterday , from 170 to 175 and is having the dizziness and light headiness while sitting . Please call    Thanks

## 2014-12-15 ENCOUNTER — Telehealth: Payer: Self-pay | Admitting: Cardiovascular Disease

## 2014-12-15 LAB — HM DEXA SCAN

## 2014-12-15 NOTE — Telephone Encounter (Signed)
Received call from daughter. She reports patient BP 140/90 - wanted to make sure this was OK since he was recently 90/60 before going on meds. She reports that he is not having any active concerns, no further weight gain.  Explained metolazone's effect, recommended continue to check BPs and record so we can determine trend - explained pt may need further adjustments on meds in near future but wouldn't advise changing anything while he is getting used to metolazone. If any further weight gain or return of dizziness, inform us. She voiced understanding.

## 2014-12-15 NOTE — Telephone Encounter (Signed)
Spoke to [place] post-home visit, she reports BPs 118/76 w HR 70 sitting, 102/66 w/ HR 74 standing No complaints of dizziness on part of patient. She reports weight - no change since yesterday. Pt still having some puffiness in ankles. This corroborates w/ report from pt's daughter this AM. We reviewed meds, gave her clarification on instructions for metolazone, advised to call if further weight increases, advised to call for return of dizziness or orthostatic changes.  HHRN verbalized understanding.

## 2014-12-19 ENCOUNTER — Telehealth: Payer: Self-pay | Admitting: Cardiovascular Disease

## 2014-12-19 NOTE — Telephone Encounter (Signed)
Agree with the advice given.

## 2014-12-19 NOTE — Telephone Encounter (Signed)
Pt weight up, 170-175 last week when off torsemide, has gained ~1 lb a day and up to 178 lbs w/ 3+ edema in legs per [facility] report. She states pt not SOB. None or minimal problems w/ dizziness over weekend.  She will see pt later in the week, wanted advice. Reports BPs stable [id number] systolic.  Advised if pt BPs stable now on midodrine, resume torsemide 20mg  once daily w/ caution - call if new problems or unimproved. Encourage fluids if pt BP low.  Advice from [facility] regarding postural dizziness stands, reiterated this advice.  HHRN voiced understanding.  Pt sees Dr. Sallyanne Kuster on 12.2. Will route to see if further advice or recommend extender visit in interim.

## 2014-12-20 ENCOUNTER — Encounter: Payer: Self-pay | Admitting: Cardiovascular Disease

## 2014-12-20 ENCOUNTER — Telehealth: Payer: Self-pay | Admitting: Cardiovascular Disease

## 2014-12-20 NOTE — Telephone Encounter (Signed)
Pt's daughter is calling back to speak Ovid Curd about the swelling in the pt's feet and ankles. Please f/u with pt  Thanks

## 2014-12-20 NOTE — Telephone Encounter (Signed)
Advice given to [facility] restated for daughter today, who was at house and wanted to make sure she was aware of care plan.  She voiced understanding of recommendations outlined during yesterday's call w/ Delena Serve, [facility].

## 2014-12-22 ENCOUNTER — Encounter: Payer: Self-pay | Admitting: Cardiology

## 2014-12-22 ENCOUNTER — Telehealth: Payer: Self-pay | Admitting: Cardiovascular Disease

## 2014-12-22 LAB — CUP PACEART REMOTE DEVICE CHECK
Battery Remaining Longevity: 37 mo
Battery Remaining Percentage: 35 %
Battery Voltage: 2.83 V
Brady Statistic AP VP Percent: 87 %
Brady Statistic AP VS Percent: 13 %
Brady Statistic AS VP Percent: 1 %
Brady Statistic AS VS Percent: 1 %
Brady Statistic RA Percent Paced: 99 %
Brady Statistic RV Percent Paced: 87 %
Date Time Interrogation Session: 20161101060010
Implantable Lead Implant Date: 20100309
Implantable Lead Implant Date: 20100309
Implantable Lead Location: 753859
Implantable Lead Location: 753860
Lead Channel Impedance Value: 380 Ohm
Lead Channel Impedance Value: 480 Ohm
Lead Channel Pacing Threshold Amplitude: 0.625 V
Lead Channel Pacing Threshold Amplitude: 0.75 V
Lead Channel Pacing Threshold Pulse Width: 0.4 ms
Lead Channel Pacing Threshold Pulse Width: 0.4 ms
Lead Channel Sensing Intrinsic Amplitude: 2.2 mV
Lead Channel Sensing Intrinsic Amplitude: 9.7 mV
Lead Channel Setting Pacing Amplitude: 0.875
Lead Channel Setting Pacing Amplitude: 2.5 V
Lead Channel Setting Pacing Pulse Width: 0.4 ms
Lead Channel Setting Sensing Sensitivity: 2 mV
Pulse Gen Model: 2210
Pulse Gen Serial Number: 2173363

## 2014-12-22 NOTE — Telephone Encounter (Signed)
Pt's wife is calling back in stating that the pt has dropped 7 lbs over night. Please f/u with him  Thanks

## 2014-12-22 NOTE — Telephone Encounter (Signed)
Spoke to wife Wife state the weights were obtained from the same scale. Information given to wife. She verbalized understanding   change to medication list completed.

## 2014-12-22 NOTE — Telephone Encounter (Signed)
Spoke to patient States weight today 169 lbs,  yesterday 176 Patient saw [name] , yesterday he was instructed to take 1/2 tablet of torsemide 20 mg daily he also had lab work drawn. Patient states Dr  Pennie Banter nurse called and said labs were all okay.  Patient states he has aready taken medication today.RN informed patient to hold the 1/2 dose of torsemide until further notice Will obtain labs and office note Will defer to Dr Sallyanne Kuster

## 2014-12-22 NOTE — Telephone Encounter (Signed)
I assume the weights were on the same scale.  Agree , do not take anymore torsemide today.  let's try this:  Weigh daily and take torsemide 10 mg (half of the 20 mg tablet) only on days when weight exceeds 175 pounds on his home scale

## 2014-12-23 ENCOUNTER — Telehealth: Payer: Self-pay | Admitting: Cardiovascular Disease

## 2014-12-23 NOTE — Telephone Encounter (Signed)
Reviewed labwork sent by [facility] - this was signed by Dr. Sallyanne Kuster and placed in basket for scanning/filing.  Reviewed instructions w/ [name], pt daughter. At Dr. Victorino December recommendation, conservative use of torsemide only as needed, consider it better for pt to retain a little bit of fluid instead of being too dry and having dizziness.  [name] aware. She will continue to monitor condition, adhere to medication regiment as outlined, and will call if worsening concerns.

## 2014-12-23 NOTE — Telephone Encounter (Signed)
Understood. As reviewed, will always be a very difficult task to keep fluid status just right between too wet (dyspnea) and too dry (dizziness). Weight based diuretics as per earlier note.

## 2014-12-23 NOTE — Telephone Encounter (Signed)
Spoke to [employer]. She reports pt had "a great day yesterday" but feels poorly today. Had significant drop in fluid yesterday and she reports he felt better w/ this. Has had return of dizziness today. BPs continue to be [id number] systolic at rest.  She states that he dropped several lbs over course of 1 day yesterday, so much that it concerned her, but since he noted feeling better she dismissed concern initially. Discussed w/ [name] that we don't have any recent labwork (ie. BMET) and this may be useful in determining if the dizziness is worse/returned due to fluid/electrolyte shifts. Reaffirmed Dr. Victorino December instructions regarding the torsemide. Encouraged gatorade or sports drinks. She reports pt seen by [facility] Wednesday evening and he had labwork there. Made request to have this labwork faxed to [place].  Will route to Dr. Sallyanne Kuster for further recommendations.

## 2014-12-28 ENCOUNTER — Other Ambulatory Visit: Payer: Self-pay | Admitting: Physician Assistant

## 2014-12-28 ENCOUNTER — Ambulatory Visit
Admission: RE | Admit: 2014-12-28 | Discharge: 2014-12-28 | Disposition: A | Discharge status: Home / Self Care | Payer: Medicare Other | Source: Ambulatory Visit | Attending: Physician Assistant | Admitting: Physician Assistant

## 2014-12-28 DIAGNOSIS — K5900 Constipation, unspecified: Secondary | ICD-10-CM

## 2014-12-29 ENCOUNTER — Emergency Department (HOSPITAL_COMMUNITY): Payer: Medicare Other

## 2014-12-29 ENCOUNTER — Encounter (HOSPITAL_COMMUNITY): Payer: Self-pay | Admitting: Emergency Medicine

## 2014-12-29 ENCOUNTER — Observation Stay (HOSPITAL_COMMUNITY)
Admission: EM | Admit: 2014-12-29 | Discharge: 2015-01-01 | Disposition: A | Discharge status: Skilled Nursing Facility | Payer: Medicare Other | Attending: Family Medicine | Admitting: Family Medicine

## 2014-12-29 DIAGNOSIS — L8992 Pressure ulcer of unspecified site, stage 2: Secondary | ICD-10-CM | POA: Insufficient documentation

## 2014-12-29 DIAGNOSIS — E039 Hypothyroidism, unspecified: Secondary | ICD-10-CM | POA: Diagnosis present

## 2014-12-29 DIAGNOSIS — K219 Gastro-esophageal reflux disease without esophagitis: Secondary | ICD-10-CM | POA: Insufficient documentation

## 2014-12-29 DIAGNOSIS — Z9842 Cataract extraction status, left eye: Secondary | ICD-10-CM | POA: Diagnosis not present

## 2014-12-29 DIAGNOSIS — M479 Spondylosis, unspecified: Secondary | ICD-10-CM | POA: Diagnosis not present

## 2014-12-29 DIAGNOSIS — K5641 Fecal impaction: Secondary | ICD-10-CM | POA: Diagnosis not present

## 2014-12-29 DIAGNOSIS — E785 Hyperlipidemia, unspecified: Secondary | ICD-10-CM | POA: Insufficient documentation

## 2014-12-29 DIAGNOSIS — Z7902 Long term (current) use of antithrombotics/antiplatelets: Secondary | ICD-10-CM | POA: Diagnosis not present

## 2014-12-29 DIAGNOSIS — L8990 Pressure ulcer of unspecified site, unspecified stage: Secondary | ICD-10-CM | POA: Insufficient documentation

## 2014-12-29 DIAGNOSIS — I4510 Unspecified right bundle-branch block: Secondary | ICD-10-CM | POA: Insufficient documentation

## 2014-12-29 DIAGNOSIS — F419 Anxiety disorder, unspecified: Secondary | ICD-10-CM | POA: Diagnosis not present

## 2014-12-29 DIAGNOSIS — I495 Sick sinus syndrome: Secondary | ICD-10-CM | POA: Diagnosis not present

## 2014-12-29 DIAGNOSIS — K5900 Constipation, unspecified: Secondary | ICD-10-CM

## 2014-12-29 DIAGNOSIS — Z7982 Long term (current) use of aspirin: Secondary | ICD-10-CM | POA: Insufficient documentation

## 2014-12-29 DIAGNOSIS — I4891 Unspecified atrial fibrillation: Secondary | ICD-10-CM | POA: Diagnosis not present

## 2014-12-29 DIAGNOSIS — R4701 Aphasia: Secondary | ICD-10-CM | POA: Insufficient documentation

## 2014-12-29 DIAGNOSIS — E7800 Pure hypercholesterolemia, unspecified: Secondary | ICD-10-CM | POA: Diagnosis present

## 2014-12-29 DIAGNOSIS — Z961 Presence of intraocular lens: Secondary | ICD-10-CM | POA: Insufficient documentation

## 2014-12-29 DIAGNOSIS — I10 Essential (primary) hypertension: Secondary | ICD-10-CM | POA: Diagnosis not present

## 2014-12-29 DIAGNOSIS — M170 Bilateral primary osteoarthritis of knee: Secondary | ICD-10-CM | POA: Diagnosis not present

## 2014-12-29 DIAGNOSIS — G4700 Insomnia, unspecified: Secondary | ICD-10-CM | POA: Insufficient documentation

## 2014-12-29 DIAGNOSIS — R339 Retention of urine, unspecified: Secondary | ICD-10-CM | POA: Diagnosis not present

## 2014-12-29 DIAGNOSIS — I252 Old myocardial infarction: Secondary | ICD-10-CM | POA: Insufficient documentation

## 2014-12-29 DIAGNOSIS — D72829 Elevated white blood cell count, unspecified: Secondary | ICD-10-CM | POA: Diagnosis not present

## 2014-12-29 DIAGNOSIS — Z79899 Other long term (current) drug therapy: Secondary | ICD-10-CM | POA: Insufficient documentation

## 2014-12-29 DIAGNOSIS — Z951 Presence of aortocoronary bypass graft: Secondary | ICD-10-CM | POA: Diagnosis not present

## 2014-12-29 DIAGNOSIS — I959 Hypotension, unspecified: Secondary | ICD-10-CM | POA: Diagnosis not present

## 2014-12-29 DIAGNOSIS — I255 Ischemic cardiomyopathy: Secondary | ICD-10-CM | POA: Diagnosis not present

## 2014-12-29 DIAGNOSIS — Z791 Long term (current) use of non-steroidal anti-inflammatories (NSAID): Secondary | ICD-10-CM | POA: Diagnosis not present

## 2014-12-29 DIAGNOSIS — Z9841 Cataract extraction status, right eye: Secondary | ICD-10-CM | POA: Insufficient documentation

## 2014-12-29 DIAGNOSIS — Z950 Presence of cardiac pacemaker: Secondary | ICD-10-CM | POA: Insufficient documentation

## 2014-12-29 DIAGNOSIS — R109 Unspecified abdominal pain: Secondary | ICD-10-CM | POA: Diagnosis present

## 2014-12-29 DIAGNOSIS — I2510 Atherosclerotic heart disease of native coronary artery without angina pectoris: Secondary | ICD-10-CM | POA: Diagnosis not present

## 2014-12-29 LAB — CBC WITH DIFFERENTIAL/PLATELET
Basophils Absolute: 0 10*3/uL (ref 0.0–0.1)
Basophils Relative: 0 %
Eosinophils Absolute: 0.1 10*3/uL (ref 0.0–0.7)
Eosinophils Relative: 0 %
HCT: 42.3 % (ref 39.0–52.0)
Hemoglobin: 14.3 g/dL (ref 13.0–17.0)
Lymphocytes Relative: 6 %
Lymphs Abs: 0.8 10*3/uL (ref 0.7–4.0)
MCH: 33.5 pg (ref 26.0–34.0)
MCHC: 33.8 g/dL (ref 30.0–36.0)
MCV: 99.1 fL (ref 78.0–100.0)
Monocytes Absolute: 1.3 10*3/uL — ABNORMAL HIGH (ref 0.1–1.0)
Monocytes Relative: 10 %
Neutro Abs: 11.2 10*3/uL — ABNORMAL HIGH (ref 1.7–7.7)
Neutrophils Relative %: 84 %
Platelets: 208 10*3/uL (ref 150–400)
RBC: 4.27 MIL/uL (ref 4.22–5.81)
RDW: 14.7 % (ref 11.5–15.5)
WBC: 13.4 10*3/uL — ABNORMAL HIGH (ref 4.0–10.5)

## 2014-12-29 LAB — COMPREHENSIVE METABOLIC PANEL
ALT: 29 U/L (ref 17–63)
AST: 27 U/L (ref 15–41)
Albumin: 3.9 g/dL (ref 3.5–5.0)
Alkaline Phosphatase: 65 U/L (ref 38–126)
Anion gap: 6 (ref 5–15)
BUN: 20 mg/dL (ref 6–20)
CO2: 27 mmol/L (ref 22–32)
Calcium: 9.1 mg/dL (ref 8.9–10.3)
Chloride: 104 mmol/L (ref 101–111)
Creatinine, Ser: 0.98 mg/dL (ref 0.61–1.24)
GFR calc Af Amer: >60 mL/min (ref >60)
GFR calc non Af Amer: >60 mL/min (ref >60)
Glucose, Bld: 123 mg/dL — ABNORMAL HIGH (ref 65–99)
Potassium: 4.3 mmol/L (ref 3.5–5.1)
Sodium: 137 mmol/L (ref 135–145)
Total Bilirubin: 0.6 mg/dL (ref 0.3–1.2)
Total Protein: 7 g/dL (ref 6.5–8.1)

## 2014-12-29 LAB — LIPASE, BLOOD: Lipase: 28 U/L (ref 11–51)

## 2014-12-29 LAB — T4, FREE: Free T4: 1.12 ng/dL (ref 0.61–1.12)

## 2014-12-29 LAB — CBG MONITORING, ED: Glucose-Capillary: 132 mg/dL — ABNORMAL HIGH (ref 65–99)

## 2014-12-29 LAB — TSH: TSH: 3.25 u[IU]/mL (ref 0.350–4.500)

## 2014-12-29 MED ORDER — ONDANSETRON HCL 4 MG/2ML IJ SOLN
4.0000 mg | Freq: Once | INTRAMUSCULAR | Status: AC
  Administered 2014-12-29: 4 mg via INTRAVENOUS
  Filled 2014-12-29: qty 2

## 2014-12-29 MED ORDER — SODIUM CHLORIDE 0.9 % IV SOLN
1000.0000 mL | INTRAVENOUS | Status: DC
  Administered 2014-12-29: 1000 mL via INTRAVENOUS

## 2014-12-29 MED ORDER — IOHEXOL 300 MG/ML  SOLN
50.0000 mL | Freq: Once | INTRAMUSCULAR | Status: AC | PRN
  Administered 2014-12-29: 50 mL via ORAL

## 2014-12-29 MED ORDER — MILK AND MOLASSES ENEMA
1.0000 | Freq: Once | RECTAL | Status: AC
  Administered 2014-12-29: 250 mL via RECTAL
  Filled 2014-12-29: qty 250

## 2014-12-29 NOTE — ED Notes (Addendum)
Pt given urinal. Unable to urinate at this time. Will re-assess promptly.

## 2014-12-29 NOTE — ED Provider Notes (Addendum)
CSN: [id number]     Arrival date & time 12/29/14  1408 History   First MD Initiated Contact with Patient 12/29/14 1458     Chief Complaint  Patient presents with  . Abdominal Pain  . Dizziness     (Consider location/radiation/quality/duration/timing/severity/associated sxs/prior Treatment) HPI 81 year old male who presents with abdominal pain. History of appendectomy, TURP, hypothyroidism, CAD s/p CABG, Afib and SSS s/p St. Jude PPM. States ongoing abdominal pain for 3 days, but primarily in his rectum, he reports. Has had abdominal distension and intermittent abdominal pain. Has had some decreased appetite in setting of constipation, but no major weight loss. Has been dealing for constipation, taking multiple regimens including amitiza, magnesium citrate and Miralax. Has not had bowel movement in one week. No N/V, fevers, chills, or urinary complaints.  Past Medical History  Diagnosis Date  . CAD (coronary artery disease)     CABG (LIMA-LAD, SVG-RCA, SVG-OM in 1996).  07/2009 BMS to SVG-RCA. Cath in 04/2010 with patent stents   . Bradycardia     AFib/SSS s/p St Jude PPM 04/12/2008  . Hypothyroid   . Pacemaker   . Hyperlipidemia   . GERD (gastroesophageal reflux disease)   . Hiatal hernia   . Insomnia   . S/P CABG x 4   . RBBB   . Melanoma of back ([place]) 1976  . Heart murmur     "just told I had one today" (12/15/2012)  . Myocardial infarction [facility]) 1996; 2011    "both silent" (12/15/2012)  . Exertional shortness of breath     "sometimes walking" (12/15/2012)  . Arthritis     "minor, back and sometimes knees" (12/15/2012)  . Sustained ventricular tachycardia ([place]) 07/27/2014  . Hypertension   . [place] spotted fever    Past Surgical History  Procedure Laterality Date  . US echocardiography  07/11/2009    EF 45-50%  . Nm myoview ltd  06/2011    low risk  . Coronary artery bypass graft  1996    LIMA to LAD,SVG to RCA & SVG to OM  . Tonsillectomy  1938  . Appendectomy   1942  . Coronary angioplasty with stent placement  07/2009    bare metal stent to SVG to the RCA  . Cardiac catheterization  04/2010    LIMA to LAD patent,SVG to OM patent,no in-stnet restenosis RCA  . Cataract extraction w/ intraocular lens  implant, bilateral Bilateral 2012  . Melanoma excision  05/1974 X2    "taken off my back" (12/15/2012)  . Transurethral resection of prostate  1986  . Insert / replace / remove pacemaker  2010   Family History  Problem Relation Age of Onset  . Coronary artery disease Mother   . Diabetes Mother   . Heart disease Mother   . Coronary artery disease Father   . Diabetes Father   . Lung cancer Father   . Heart disease Father    Social History  Substance Use Topics  . Smoking status: Never Smoker   . Smokeless tobacco: Never Used  . Alcohol Use: No    Review of Systems 10/14 systems reviewed and are negative other than those stated in the HPI   Allergies  Crestor; Altace; Bactrim; Penicillins; and Sulfa antibiotics  Home Medications   Prior to Admission medications   Medication Sig Start Date End Date Taking? Authorizing Provider  ALPRAZolam Duanne Moron) 0.5 MG tablet Take 0.5 mg by mouth daily as needed for anxiety or sleep.  12/09/14  Yes Historical Provider, MD  amiodarone (PACERONE) 400 MG tablet Take 1 tablet (400 mg total) by mouth daily. 12/05/14  Yes Velvet Bathe, MD  AMITIZA 8 MCG capsule Take 8 mcg by mouth 2 (two) times daily. 12/26/14  Yes Historical Provider, MD  aspirin EC 81 MG EC tablet Take 1 tablet (81 mg total) by mouth daily. 12/16/12  Yes Isaiah Serge, NP  bisacodyl (DULCOLAX) 5 MG EC tablet Take 5 mg by mouth daily as needed for moderate constipation.   Yes Historical Provider, MD  Cholecalciferol (VITAMIN D3) 5000 UNITS CAPS Take 1 capsule by mouth every Monday.    Yes Historical Provider, MD  clobetasol ointment (TEMOVATE) [id number] % Apply 1 application topically 2 (two) times daily.   Yes Historical Provider, MD  clopidogrel  (PLAVIX) 75 MG tablet Take 75 mg by mouth daily.   Yes Historical Provider, MD  colchicine 0.6 MG tablet Take 1 tablet (0.6 mg total) by mouth daily. 12/05/14  Yes Velvet Bathe, MD  famotidine (PEPCID) 20 MG tablet Take 20 mg by mouth at bedtime.   Yes Historical Provider, MD  ipratropium (ATROVENT) 0.06 % nasal spray Place 2 sprays into both nostrils 2 (two) times daily as needed for rhinitis.  10/11/14  Yes Historical Provider, MD  levothyroxine (SYNTHROID, LEVOTHROID) 112 MCG tablet Take 112 mcg by mouth daily before breakfast.   Yes Historical Provider, MD  magnesium citrate SOLN Take 0.5 Bottles by mouth once.   Yes Historical Provider, MD  meloxicam (MOBIC) 7.5 MG tablet Take 1 tablet (7.5 mg total) by mouth daily. 12/05/14  Yes Velvet Bathe, MD  midodrine (PROAMATINE) 2.5 MG tablet Take 1 tablet (2.5 mg total) by mouth 3 (three) times daily with meals. Take third dose at least 6 hours before bedtime 12/12/14  Yes Rhonda G Barrett, PA-C  nystatin (MYCOSTATIN/NYSTOP) 100000 UNIT/GM POWD APPLY TWICE DAILY TO YOUR SCROTUM AND GENITAL AREA TO COVER THE AREA 12/09/14  Yes Historical Provider, MD  pantoprazole (PROTONIX) 40 MG tablet TAKE 1 TABLET BY MOUTH TWICE A DAY 12/13/14  Yes Mihai Croitoru, MD  polyethylene glycol (MIRALAX / GLYCOLAX) packet Take 17 g by mouth daily as needed (constipation).   Yes Historical Provider, MD  pravastatin (PRAVACHOL) 40 MG tablet Take 40 mg by mouth daily. 09/20/14  Yes Historical Provider, MD  rOPINIRole (REQUIP) 0.5 MG tablet Take 0.5-1 mg by mouth every evening. 12/25/14  Yes Historical Provider, MD  torsemide (DEMADEX) 20 MG tablet Take 1 tablet (20 mg total) by mouth daily. Patient taking differently: Take 10 mg by mouth daily as needed. Take if dry weight is above 175lbs or above 12/06/14  Yes Velvet Bathe, MD  traZODone (DESYREL) 50 MG tablet Take 0.5 tablets (25 mg total) by mouth at bedtime as needed for sleep. 12/05/14  Yes Velvet Bathe, MD  gabapentin  (NEURONTIN) 100 MG capsule Take 1 pill nightly at bedtime for 1 week, then 2 pills nightly for 1 week, then 3 pills each night thereafter 12/05/14   Star Age, MD   BP 111/64 mmHg  Pulse 70  Temp(Src) 98.1 F (36.7 C) (Oral)  Resp 18  SpO2 94% Physical Exam Physical Exam  Nursing note and vitals reviewed. Constitutional: Well developed, well nourished, non-toxic, and in no acute distress Head: Normocephalic and atraumatic.  Mouth/Throat: Oropharynx is clear and moist.  Neck: Normal range of motion. Neck supple.  Cardiovascular: Normal rate and regular rhythm.   Pulmonary/Chest: Effort normal and breath sounds normal.  Abdominal: Soft. Mild  distension. There is no tenderness. There is no rebound and no guarding. Significant amount of feces impacted within the rectum.  Musculoskeletal: Normal range of motion.  Neurological: Alert, no facial droop, fluent speech, moves all extremities symmetrically Skin: Skin is warm and dry.  Psychiatric: Cooperative  ED Course  Procedures (including critical care time) Labs Review Labs Reviewed  CBC WITH DIFFERENTIAL/PLATELET - Abnormal; Notable for the following:    WBC 13.4 (*)    Neutro Abs 11.2 (*)    Monocytes Absolute 1.3 (*)    All other components within normal limits  COMPREHENSIVE METABOLIC PANEL - Abnormal; Notable for the following:    Glucose, Bld 123 (*)    All other components within normal limits  CBG MONITORING, ED - Abnormal; Notable for the following:    Glucose-Capillary 132 (*)    All other components within normal limits  LIPASE, BLOOD  TSH  T4, FREE  URINALYSIS, ROUTINE W REFLEX MICROSCOPIC (NOT AT [facility])    Imaging Review Ct Abdomen Pelvis Wo Contrast  12/29/2014  CLINICAL DATA:  Rectal pain EXAM: CT ABDOMEN AND PELVIS WITHOUT CONTRAST TECHNIQUE: Multidetector CT imaging of the abdomen and pelvis was performed following the standard protocol without IV contrast. COMPARISON:  11/02/2003 FINDINGS: There is large  stool burden in the rectum. There is wall thickening surrounding the rectum and slight stranding in the adjacent fat compatible with direct itis. The bladder is moderately distended. No ureteral calculus. No hydronephrosis. Simple cysts in the kidneys. Dependent atelectasis at the right lung base. Moderate-sized hiatal hernia. Large amount of food contents is present in the stomach. Liver, spleen, pancreas, adrenal glands are within normal limits Gallbladder is decompressed Moderate stool burden throughout the course of the colon without abnormal distention or dilatation. Previously visualized inguinal hernia is no longer visualized and may have been corrected surgically. There is no free-fluid. No free intraperitoneal gas. No abnormal retroperitoneal adenopathy. Atherosclerotic changes of the aorta are noted. IMPRESSION: There is prominent stool burden in the rectum associated with inflammatory changes of the rectal wall. There is moderate stool burden throughout the remainder of the colon. There is a large amount of fluid contents in the stomach. Hiatal hernia is noted. Electronically Signed   By: Marybelle Killings M.D.   On: 12/29/2014 15:43   Dg Abd 2 Views  12/28/2014  CLINICAL DATA:  Constipation EXAM: ABDOMEN - 2 VIEW COMPARISON:  December 06, 2014 FINDINGS: Supine and upright images obtained. There is diffuse stool throughout the colon. There is no bowel dilatation or air-fluid level suggesting obstruction. No free air. There is a hiatal hernia. Lung bases appear clear. IMPRESSION: Diffuse stool throughout the colon. No obstruction or free air. Hiatal hernia present. Electronically Signed   By: Lowella Grip III M.D.   On: 12/28/2014 16:48   I have personally reviewed and evaluated these images and lab results as part of my medical decision-making.    MDM   Final diagnoses:  Fecal impaction in rectum (HCC)  Constipation, unspecified constipation type    81 year old male who presents with  rectal and abdominal pain in setting of constipation for one week. Is not toxic and in no acute distress with stable vital signs. Abdomen mildly distended but soft and benign. Fecal impaction noted on rectal exam. Blood work including CBC, CMP, and lipase overall unremarkable. CT a/p performed secondary to age, and showing moderate fecal loading throughout colon and in rectum. Manual disimpaction performed, yielding large amount of stool. Feels symptomatically improved. Did try  additional enema x 2 without additional output. Also retaining 1L of urine 2/2 constipation. Foley catheter placed. Due to significant retention of urine without resolving constipation, will admit to observation for bowel regimen.  Forde Dandy, MD 12/29/14 [phone]

## 2014-12-29 NOTE — Discharge Instructions (Signed)
Continue to take Miralax and other anti-constipation agents. Return if unable to have bowel movement after disimpaction or any worsening symptoms, such as worsening abdominal pain, vomiting, confusion, or any other symptoms concerning to you.   Constipation, Adult Constipation is when a person:  Poops (has a bowel movement) less than 3 times a week.  Has a hard time pooping.  Has poop that is dry, hard, or bigger than normal. HOME CARE   Eat foods with a lot of fiber in them. This includes fruits, vegetables, beans, and whole grains such as brown rice.  Avoid fatty foods and foods with a lot of sugar. This includes french fries, hamburgers, cookies, candy, and soda.  If you are not getting enough fiber from food, take products with added fiber in them (supplements).  Drink enough fluid to keep your pee (urine) clear or pale yellow.  Exercise on a regular basis, or as told by your doctor.  Go to the restroom when you feel like you need to poop. Do not hold it.  Only take medicine as told by your doctor. Do not take medicines that help you poop (laxatives) without talking to your doctor first. GET HELP RIGHT AWAY IF:   You have bright red blood in your poop (stool).  Your constipation lasts more than 4 days or gets worse.  You have belly (abdominal) or butt (rectal) pain.  You have thin poop (as thin as a pencil).  You lose weight, and it cannot be explained. MAKE SURE YOU:   Understand these instructions.  Will watch your condition.  Will get help right away if you are not doing well or get worse.   This information is not intended to replace advice given to you by your health care provider. Make sure you discuss any questions you have with your health care provider.   Document Released: 07/10/2007 Document Revised: 02/11/2014 Document Reviewed: 11/02/2012 Elsevier Interactive Patient Education [employer].

## 2014-12-29 NOTE — H&P (Signed)
[facility] History and Physical  Adam [name] [id number] DOB: Dec 21, 1926 DOA: 12/29/2014  Referring physician:ED PCP: Horatio Pel, MD   Chief Complaint: Constipation and urinary retention  HPI:   81 year old male with PMH of TURP, hypothyroidism, HLD, CAD s/p CABG, Afib,  and SSS s/p St. Jude PPM; who presents with a one-week history of constipation. Patient notes that he never had issues like this in the past. Reports being seen by at least 6 different primary care people for this issue. [facility] take citrate, Amitiza, milk of magnesia, enema without relief. Patient reported progressive worsening abdominal pain, distention, and difficulty urinating. He had been unable to urinate for the last 12 hours. Him and wife became very concerned brown to the emergency department for further evaluation. Patient notes previous history of colonoscopy 5 years ago where he states last above polyps were less than 5 polyps seen. However due to his age he was told that he did not need to follow back up the repeat colonoscopy. Patient denies any fever, nausea, vomiting, chest pain. He had additional associated symptoms of intermittent shortness of breath.  Patient reports lastly that he had some difficulties getting his words out that was new and that he previously had not had before. He states symptoms have been ongoing for at least a month now. Denies any significant choking or gagging episodes.  Upon admission into the emergency department a CT scan of the abdomen was performed sshowing moderate fecal loading throughout colon and in rectum. Manual disimpaction performed by the ED physician, yielding large amount of stool. Foley catheter placed thereafter which yielded 1 L urine.  Review of Systems  Constitutional: Negative for weight loss and diaphoresis.  HENT: Negative for tinnitus.   Eyes: Negative for double vision and photophobia.  Respiratory: Positive for shortness of breath.  Negative for wheezing.   Cardiovascular: Negative for chest pain and palpitations.  Gastrointestinal: Positive for abdominal pain and constipation. Negative for nausea, vomiting and blood in stool.  Genitourinary: Positive for urgency. Negative for frequency and flank pain.       Positive for urinary retention  Musculoskeletal: Positive for joint pain. Negative for neck pain.  Skin: Positive for rash. Negative for itching.  Neurological: Positive for speech change. Negative for focal weakness and headaches.     Past Medical History  Diagnosis Date  . CAD (coronary artery disease)     CABG (LIMA-LAD, SVG-RCA, SVG-OM in 1996).  07/2009 BMS to SVG-RCA. Cath in 04/2010 with patent stents   . Bradycardia     AFib/SSS s/p St Jude PPM 04/12/2008  . Hypothyroid   . Pacemaker   . Hyperlipidemia   . GERD (gastroesophageal reflux disease)   . Hiatal hernia   . Insomnia   . S/P CABG x 4   . RBBB   . Melanoma of back ([place]) 1976  . Heart murmur     "just told I had one today" (12/15/2012)  . Myocardial infarction [facility]) 1996; 2011    "both silent" (12/15/2012)  . Exertional shortness of breath     "sometimes walking" (12/15/2012)  . Arthritis     "minor, back and sometimes knees" (12/15/2012)  . Sustained ventricular tachycardia ([place]) 07/27/2014  . Hypertension   . [place] spotted fever      Past Surgical History  Procedure Laterality Date  . US echocardiography  07/11/2009    EF 45-50%  . Nm myoview ltd  06/2011    low risk  . Coronary artery bypass  graft  1996    LIMA to LAD,SVG to RCA & SVG to OM  . Tonsillectomy  1938  . Appendectomy  1942  . Coronary angioplasty with stent placement  07/2009    bare metal stent to SVG to the RCA  . Cardiac catheterization  04/2010    LIMA to LAD patent,SVG to OM patent,no in-stnet restenosis RCA  . Cataract extraction w/ intraocular lens  implant, bilateral Bilateral 2012  . Melanoma excision  05/1974 X2    "taken off my back" (12/15/2012)   . Transurethral resection of prostate  1986  . Insert / replace / remove pacemaker  2010      Social History:  reports that he has never smoked. He has never used smokeless tobacco. He reports that he does not drink alcohol or use illicit drugs. Where does patient live--home  and with whom if at home? Wife Can patient participate in ADLs? Yes  Allergies  Allergen Reactions  . Crestor [Rosuvastatin Calcium]     Unknown reaction   . Altace [Ramipril] Other (See Comments)    Cough  . Bactrim [Sulfamethoxazole-Trimethoprim] Rash  . Penicillins Rash    Has patient had a PCN reaction causing immediate rash, facial/tongue/throat swelling, SOB or lightheadedness with hypotension: No Has patient had a PCN reaction causing severe rash involving mucus membranes or skin necrosis: Yes Has patient had a PCN reaction that required hospitalization Yes Has patient had a PCN reaction occurring within the last 10 years: No If all of the above answers are "NO", then may proceed with Cephalosporin use.   . Sulfa Antibiotics Rash    Family History  Problem Relation Age of Onset  . Coronary artery disease Mother   . Diabetes Mother   . Heart disease Mother   . Coronary artery disease Father   . Diabetes Father   . Lung cancer Father   . Heart disease Father         Prior to Admission medications   Medication Sig Start Date End Date Taking? Authorizing Provider  ALPRAZolam Duanne Moron) 0.5 MG tablet Take 0.5 mg by mouth daily as needed for anxiety or sleep.  12/09/14  Yes Historical Provider, MD  amiodarone (PACERONE) 400 MG tablet Take 1 tablet (400 mg total) by mouth daily. 12/05/14  Yes Velvet Bathe, MD  AMITIZA 8 MCG capsule Take 8 mcg by mouth 2 (two) times daily. 12/26/14  Yes Historical Provider, MD  aspirin EC 81 MG EC tablet Take 1 tablet (81 mg total) by mouth daily. 12/16/12  Yes Isaiah Serge, NP  bisacodyl (DULCOLAX) 5 MG EC tablet Take 5 mg by mouth daily as needed for moderate  constipation.   Yes Historical Provider, MD  Cholecalciferol (VITAMIN D3) 5000 UNITS CAPS Take 1 capsule by mouth every Monday.    Yes Historical Provider, MD  clobetasol ointment (TEMOVATE) [id number] % Apply 1 application topically 2 (two) times daily.   Yes Historical Provider, MD  clopidogrel (PLAVIX) 75 MG tablet Take 75 mg by mouth daily.   Yes Historical Provider, MD  colchicine 0.6 MG tablet Take 1 tablet (0.6 mg total) by mouth daily. 12/05/14  Yes Velvet Bathe, MD  famotidine (PEPCID) 20 MG tablet Take 20 mg by mouth at bedtime.   Yes Historical Provider, MD  ipratropium (ATROVENT) 0.06 % nasal spray Place 2 sprays into both nostrils 2 (two) times daily as needed for rhinitis.  10/11/14  Yes Historical Provider, MD  levothyroxine (SYNTHROID, LEVOTHROID) 112 MCG tablet Take 112  mcg by mouth daily before breakfast.   Yes Historical Provider, MD  magnesium citrate SOLN Take 0.5 Bottles by mouth once.   Yes Historical Provider, MD  meloxicam (MOBIC) 7.5 MG tablet Take 1 tablet (7.5 mg total) by mouth daily. 12/05/14  Yes Velvet Bathe, MD  midodrine (PROAMATINE) 2.5 MG tablet Take 1 tablet (2.5 mg total) by mouth 3 (three) times daily with meals. Take third dose at least 6 hours before bedtime 12/12/14  Yes Rhonda G Barrett, PA-C  nystatin (MYCOSTATIN/NYSTOP) 100000 UNIT/GM POWD APPLY TWICE DAILY TO YOUR SCROTUM AND GENITAL AREA TO COVER THE AREA 12/09/14  Yes Historical Provider, MD  pantoprazole (PROTONIX) 40 MG tablet TAKE 1 TABLET BY MOUTH TWICE A DAY 12/13/14  Yes Mihai Croitoru, MD  polyethylene glycol (MIRALAX / GLYCOLAX) packet Take 17 g by mouth daily as needed (constipation).   Yes Historical Provider, MD  pravastatin (PRAVACHOL) 40 MG tablet Take 40 mg by mouth daily. 09/20/14  Yes Historical Provider, MD  rOPINIRole (REQUIP) 0.5 MG tablet Take 0.5-1 mg by mouth every evening. 12/25/14  Yes Historical Provider, MD  torsemide (DEMADEX) 20 MG tablet Take 1 tablet (20 mg total) by mouth  daily. Patient taking differently: Take 10 mg by mouth daily as needed. Take if dry weight is above 175lbs or above 12/06/14  Yes Velvet Bathe, MD  traZODone (DESYREL) 50 MG tablet Take 0.5 tablets (25 mg total) by mouth at bedtime as needed for sleep. 12/05/14  Yes Velvet Bathe, MD  gabapentin (NEURONTIN) 100 MG capsule Take 1 pill nightly at bedtime for 1 week, then 2 pills nightly for 1 week, then 3 pills each night thereafter 12/05/14   Star Age, MD     Physical Exam: Filed Vitals:   12/29/14 1418 12/29/14 1654 12/29/14 1902 12/29/14 2205  BP: 141/68 125/73 129/71 111/64  Pulse: 70 70 70 70  Temp:    98.1 F (36.7 C)  TempSrc:    Oral  Resp: 18 17 16 18   SpO2: 98% 96% 95% 94%     Constitutional: Vital signs reviewed. Patient is a well-developed and well-nourished in no acute distress and cooperative with exam. Alert and oriented x3.  Head: Normocephalic and atraumatic  Ear: TM normal bilaterally  Mouth: no erythema or exudates, MMM  Eyes: PERRL, EOMI, conjunctivae normal, No scleral icterus.  Neck: Supple, Trachea midline normal ROM, No JVD, mass, thyromegaly, or carotid bruit present.  Cardiovascular: RRR, S1 normal, S2 normal, positive systolic ejection murmur 2/6, pulses symmetric and intact bilaterally  Pulmonary/Chest: CTAB, no wheezes, rales, or rhonchi  Abdominal: Soft, mildly distended, positive bowel sounds in all 4 quadrant tenderness to palpation around the lower quadrants. GU: no CVA tenderness Musculoskeletal: No joint deformities, erythema, or stiffness, ROM full and no nontender Ext: +1 pitting edema of the left lower extremity trace pitting edema of the right lower extremity No cyanosis, pulses palpable bilaterally (DP and PT)  Hematology: no cervical, inginal, or axillary adenopathy.  Neurological: A&O x3, Strenght is normal and symmetric bilaterally, cranial nerve II-XII are grossly intact, no focal motor deficit, sensory intact to light touch bilaterally,  except for some Mild expressive aphasia Skin: Warm, dry and intact. No rash, cyanosis, or clubbing.  Psychiatric: Normal mood and affect. speech and behavior is normal. Judgment and thought content normal. Cognition and memory are normal.      Data Review   Micro Results No results found for this or any previous visit (from the past 240 hour(s)).  Radiology Reports  Ct Abdomen Pelvis Wo Contrast  12/29/2014  CLINICAL DATA:  Rectal pain EXAM: CT ABDOMEN AND PELVIS WITHOUT CONTRAST TECHNIQUE: Multidetector CT imaging of the abdomen and pelvis was performed following the standard protocol without IV contrast. COMPARISON:  11/02/2003 FINDINGS: There is large stool burden in the rectum. There is wall thickening surrounding the rectum and slight stranding in the adjacent fat compatible with direct itis. The bladder is moderately distended. No ureteral calculus. No hydronephrosis. Simple cysts in the kidneys. Dependent atelectasis at the right lung base. Moderate-sized hiatal hernia. Large amount of food contents is present in the stomach. Liver, spleen, pancreas, adrenal glands are within normal limits Gallbladder is decompressed Moderate stool burden throughout the course of the colon without abnormal distention or dilatation. Previously visualized inguinal hernia is no longer visualized and may have been corrected surgically. There is no free-fluid. No free intraperitoneal gas. No abnormal retroperitoneal adenopathy. Atherosclerotic changes of the aorta are noted. IMPRESSION: There is prominent stool burden in the rectum associated with inflammatory changes of the rectal wall. There is moderate stool burden throughout the remainder of the colon. There is a large amount of fluid contents in the stomach. Hiatal hernia is noted. Electronically Signed   By: Marybelle Killings M.D.   On: 12/29/2014 15:43   Dg Chest Portable 1 View  12/03/2014  CLINICAL DATA:  Dizzy and lightheaded. EXAM: PORTABLE CHEST 1 VIEW  COMPARISON:  08/11/2014, chest radiograph. FINDINGS: Dual lead cardiac pacemaker is stable. Postsurgical changes from CABG are also noted. Cardiomediastinal silhouette is stably enlarged. Mediastinal contours appear intact. Atherosclerotic calcifications of the aorta are noted. There is a moderate in size hiatal hernia. There is no evidence of focal airspace consolidation, pleural effusion or pneumothorax. Osseous structures are without acute abnormality. Soft tissues are grossly normal. IMPRESSION: No evidence of acute cardiopulmonary process. Stable enlargement of the cardiac silhouette. Moderate in size hiatal hernia. Electronically Signed   By: Fidela Salisbury M.D.   On: 12/03/2014 13:33   Dg Knee Complete 4 Views Left  12/03/2014  CLINICAL DATA:  Pain for 1 day.  No history of trauma EXAM: LEFT KNEE - COMPLETE 4+ VIEW COMPARISON:  None. FINDINGS: Frontal, bilateral oblique, and lateral views obtained. There is no demonstrable fracture or dislocation. There is a sizable joint effusion. There is severe narrowing of the patellofemoral joint. There is moderate narrowing medially. There is extensive chondrocalcinosis. No erosive change. There are scattered foci of arterial vascular calcification. IMPRESSION: Sizable joint effusion. Marked narrowing of the patellofemoral joint. Extensive chondrocalcinosis. Moderate narrowing medially. These changes may be seen with osteoarthritis, a degree of osteoarthritis is present. However, this combination of findings is also suggestive of calcium pyrophosphate deposition disease which may present clinically as pseudogout. No fracture or dislocation apparent. Electronically Signed   By: Lowella Grip III M.D.   On: 12/03/2014 18:24   Dg Abd 2 Views  12/28/2014  CLINICAL DATA:  Constipation EXAM: ABDOMEN - 2 VIEW COMPARISON:  December 06, 2014 FINDINGS: Supine and upright images obtained. There is diffuse stool throughout the colon. There is no bowel dilatation or  air-fluid level suggesting obstruction. No free air. There is a hiatal hernia. Lung bases appear clear. IMPRESSION: Diffuse stool throughout the colon. No obstruction or free air. Hiatal hernia present. Electronically Signed   By: Lowella Grip III M.D.   On: 12/28/2014 16:48   Dg Abd Acute W/chest  12/06/2014  CLINICAL DATA:  Abdominal pain.  Constipation. EXAM: DG ABDOMEN ACUTE W/ 1V CHEST COMPARISON:  Radiographs dated 12/03/2014 and 09/08/2014 FINDINGS: Heart and lungs are clear. Large hiatal hernia. No free air in the abdomen. Air scattered throughout nondistended loops of large and small bowel. Moderate stool in the rectum. There is suggestion of compression fractures of two the lumbar vertebra but I suspect these are old. IMPRESSION: Benign appearing abdomen and chest. Probable old compression fractures in the lumbar spine. Electronically Signed   By: [name] M.D.   On: 12/06/2014 13:32     CBC  Recent Labs Lab 12/29/14 1455  WBC 13.4*  HGB 14.3  HCT 42.3  PLT 208  MCV 99.1  MCH 33.5  MCHC 33.8  RDW 14.7  LYMPHSABS 0.8  MONOABS 1.3*  EOSABS 0.1  BASOSABS 0.0    Chemistries   Recent Labs Lab 12/29/14 1455  NA 137  K 4.3  CL 104  CO2 27  GLUCOSE 123*  BUN 20  CREATININE 0.98  CALCIUM 9.1  AST 27  ALT 29  ALKPHOS 65  BILITOT 0.6   ------------------------------------------------------------------------------------------------------------------ CrCl cannot be calculated (Unknown ideal weight.). ------------------------------------------------------------------------------------------------------------------ No results for input(s): HGBA1C in the last 72 hours. ------------------------------------------------------------------------------------------------------------------ No results for input(s): CHOL, HDL, LDLCALC, TRIG, CHOLHDL, LDLDIRECT in the last 72  hours. ------------------------------------------------------------------------------------------------------------------  Recent Labs  12/29/14 1545  TSH 3.250   ------------------------------------------------------------------------------------------------------------------ No results for input(s): VITAMINB12, FOLATE, FERRITIN, TIBC, IRON, RETICCTPCT in the last 72 hours.  Coagulation profile No results for input(s): INR, PROTIME in the last 168 hours.  No results for input(s): DDIMER in the last 72 hours.  Cardiac Enzymes No results for input(s): CKMB, TROPONINI, MYOGLOBIN in the last 168 hours.  Invalid input(s): CK ------------------------------------------------------------------------------------------------------------------ Invalid input(s): POCBNP   CBG:  Recent Labs Lab 12/29/14 1702  GLUCAP 132*          Assessment/Plan Active Problems:   Urinary retention: Acute. Symptoms started in the last week likely secondary to patient's significant fecal impaction. Patient reports never having symptoms like this previously in the past. A Foley catheter placed with evacuation of over 1 L of yellow urine. Creatinine stable at 0.98. -Foley catheter in place -Strict ins and outs -Checking UA -Would consider starting on Flomax if still suffering from urinary retention -Trial of bladder training in a.m. would consult urology if unsuccessful -Hold ropinirole secondary to possible side effects with current symptoms     Fecal impaction ([place]): Partially resolved. Patient was manually disimpacted with some relief of symptoms. Patient noted multiple medications given in sequentially which could have worsened symptoms of constipation. -Check abdominal x-ray in a.m. -Observe how patient does overnight -Simplify bowel regimen -Hold Amitiza as side effects include  fecal obstruction  Leukocytosis. WBC count 13.4 however patient is afebrile. This could be reactive secondary to  constipation. -Checking UA -Recheck CBC in am   Expressive aphasia: Acute patient notes that he's been having symptoms for at least a month now difficulty getting his words out. -Stat CT the brain without contrast -Speech therapy consult in a.m.  Cardiomyopathy, ischemic with history of systolic CHF: Appears to be stable. -Check daily weights  -Heart healthy diet      Hyperlipidemia stable -continue pravastatin    Hypothyroidism: Chronic. Free T4 at the upper limit of normal at 1.12 -Continue levothyroxine at current home dose    Hypotension: Stable blood pressures will maintain obtained -continue [place]    Atrial fibrillation/sick sinus syndrome s/p Pacemaker: Stable  -Continue amiodarone    Coronary artery disease with hx of myocardial infarct w/o hx of CABG -  Continue Plavix, statin  Anxiety/ insomnia -prn Xanax  GERD (gastroesophageal reflux disease): stable -Continue Pepcid  DVT ppx lovenox  Code Status:   full Family Communication: bedside Disposition Plan: admit   Total time spent 55 minutes.Greater than 50% of this time was spent in counseling, explanation of diagnosis, planning of further management, and coordination of care  [facility] Pager [phone]  If 7PM-7AM, please contact night-coverage [email or web address] Password [facility] 12/29/2014, 11:58 PM

## 2014-12-29 NOTE — ED Notes (Signed)
Patient states he is unable to urinate - cath will be done.

## 2014-12-29 NOTE — ED Notes (Addendum)
Pt c/o rectal pain x 2 days, states he has not had a BM in 8 days, has taken 4 different laxatives without success. Pt also c/o dizziness, for which he has been seen here, states he has orthostatic hypotension. Abdomen is grossly distended.

## 2014-12-29 NOTE — ED Notes (Signed)
Waiting for molasses from pharmacy

## 2014-12-29 NOTE — ED Provider Notes (Signed)
MSE was initiated and I personally evaluated the patient and placed orders (if any) at  2:33 PM on December 29, 2014.  The patient appears stable so that the remainder of the MSE may be completed by another provider.  Patient is presenting with multiple complaints, primarily abdominal discomfort, decreased bowel movements for almost 1 week. Patient has been taking multiple medications, but has diffuse mild abdominal discomfort. No vomiting, no anorexia.  Labs initiated.  Carmin Muskrat, MD 12/29/14 1434

## 2014-12-30 ENCOUNTER — Emergency Department (HOSPITAL_COMMUNITY): Payer: Medicare Other

## 2014-12-30 DIAGNOSIS — K5900 Constipation, unspecified: Secondary | ICD-10-CM | POA: Diagnosis not present

## 2014-12-30 DIAGNOSIS — I255 Ischemic cardiomyopathy: Secondary | ICD-10-CM

## 2014-12-30 DIAGNOSIS — I2510 Atherosclerotic heart disease of native coronary artery without angina pectoris: Secondary | ICD-10-CM

## 2014-12-30 DIAGNOSIS — K5909 Other constipation: Secondary | ICD-10-CM | POA: Insufficient documentation

## 2014-12-30 DIAGNOSIS — E785 Hyperlipidemia, unspecified: Secondary | ICD-10-CM

## 2014-12-30 DIAGNOSIS — R339 Retention of urine, unspecified: Principal | ICD-10-CM

## 2014-12-30 DIAGNOSIS — D72829 Elevated white blood cell count, unspecified: Secondary | ICD-10-CM | POA: Diagnosis present

## 2014-12-30 DIAGNOSIS — R4701 Aphasia: Secondary | ICD-10-CM

## 2014-12-30 DIAGNOSIS — I951 Orthostatic hypotension: Secondary | ICD-10-CM | POA: Insufficient documentation

## 2014-12-30 DIAGNOSIS — Z950 Presence of cardiac pacemaker: Secondary | ICD-10-CM

## 2014-12-30 DIAGNOSIS — K219 Gastro-esophageal reflux disease without esophagitis: Secondary | ICD-10-CM

## 2014-12-30 DIAGNOSIS — K5641 Fecal impaction: Secondary | ICD-10-CM | POA: Diagnosis present

## 2014-12-30 DIAGNOSIS — I959 Hypotension, unspecified: Secondary | ICD-10-CM

## 2014-12-30 HISTORY — DX: Atherosclerotic heart disease of native coronary artery without angina pectoris: I25.10

## 2014-12-30 HISTORY — DX: Retention of urine, unspecified: R33.9

## 2014-12-30 LAB — URINALYSIS, ROUTINE W REFLEX MICROSCOPIC
Bilirubin Urine: NEGATIVE
Glucose, UA: NEGATIVE mg/dL
Hgb urine dipstick: NEGATIVE
Ketones, ur: NEGATIVE mg/dL
Leukocytes, UA: NEGATIVE
Nitrite: NEGATIVE
Protein, ur: NEGATIVE mg/dL
Specific Gravity, Urine: 1.02 (ref 1.005–1.030)
pH: 7 (ref 5.0–8.0)

## 2014-12-30 LAB — CBC
HCT: 39.2 % (ref 39.0–52.0)
Hemoglobin: 12.9 g/dL — ABNORMAL LOW (ref 13.0–17.0)
MCH: 32.8 pg (ref 26.0–34.0)
MCHC: 32.9 g/dL (ref 30.0–36.0)
MCV: 99.7 fL (ref 78.0–100.0)
Platelets: 194 10*3/uL (ref 150–400)
RBC: 3.93 MIL/uL — ABNORMAL LOW (ref 4.22–5.81)
RDW: 14.9 % (ref 11.5–15.5)
WBC: 10.5 10*3/uL (ref 4.0–10.5)

## 2014-12-30 LAB — BASIC METABOLIC PANEL
Anion gap: 6 (ref 5–15)
BUN: 15 mg/dL (ref 6–20)
CO2: 27 mmol/L (ref 22–32)
Calcium: 8.7 mg/dL — ABNORMAL LOW (ref 8.9–10.3)
Chloride: 105 mmol/L (ref 101–111)
Creatinine, Ser: 0.83 mg/dL (ref 0.61–1.24)
GFR calc Af Amer: >60 mL/min (ref >60)
GFR calc non Af Amer: >60 mL/min (ref >60)
Glucose, Bld: 132 mg/dL — ABNORMAL HIGH (ref 65–99)
Potassium: 4.2 mmol/L (ref 3.5–5.1)
Sodium: 138 mmol/L (ref 135–145)

## 2014-12-30 MED ORDER — PANTOPRAZOLE SODIUM 40 MG PO TBEC
40.0000 mg | DELAYED_RELEASE_TABLET | Freq: Two times a day (BID) | ORAL | Status: DC
  Administered 2014-12-30 – 2015-01-01 (×6): 40 mg via ORAL
  Filled 2014-12-30 (×9): qty 1

## 2014-12-30 MED ORDER — ASPIRIN EC 81 MG PO TBEC
81.0000 mg | DELAYED_RELEASE_TABLET | Freq: Every day | ORAL | Status: DC
  Administered 2014-12-30 – 2015-01-01 (×3): 81 mg via ORAL
  Filled 2014-12-30 (×3): qty 1

## 2014-12-30 MED ORDER — TRAZODONE HCL 50 MG PO TABS
25.0000 mg | ORAL_TABLET | Freq: Every evening | ORAL | Status: DC | PRN
  Administered 2014-12-30 – 2014-12-31 (×2): 25 mg via ORAL
  Filled 2014-12-30 (×2): qty 1

## 2014-12-30 MED ORDER — PRAVASTATIN SODIUM 40 MG PO TABS
40.0000 mg | ORAL_TABLET | Freq: Every day | ORAL | Status: DC
  Administered 2014-12-30 – 2014-12-31 (×2): 40 mg via ORAL
  Filled 2014-12-30 (×3): qty 1

## 2014-12-30 MED ORDER — IPRATROPIUM BROMIDE 0.06 % NA SOLN
2.0000 | Freq: Two times a day (BID) | NASAL | Status: DC | PRN
  Administered 2014-12-31: 2 via NASAL
  Filled 2014-12-30: qty 15

## 2014-12-30 MED ORDER — NYSTATIN 100000 UNIT/GM EX POWD
Freq: Two times a day (BID) | CUTANEOUS | Status: DC
  Administered 2014-12-30 – 2015-01-01 (×5): via TOPICAL
  Filled 2014-12-30: qty 15

## 2014-12-30 MED ORDER — VITAMIN D 1000 UNITS PO TABS: 5000.0000 [IU] | ORAL_TABLET | ORAL | Status: DC

## 2014-12-30 MED ORDER — ALPRAZOLAM 0.5 MG PO TABS
0.5000 mg | ORAL_TABLET | Freq: Every day | ORAL | Status: DC | PRN
  Administered 2014-12-30 – 2014-12-31 (×3): 0.5 mg via ORAL
  Filled 2014-12-30 (×3): qty 1

## 2014-12-30 MED ORDER — POLYETHYLENE GLYCOL 3350 17 G PO PACK: 17.0000 g | PACK | Freq: Every day | ORAL | Status: DC | PRN

## 2014-12-30 MED ORDER — LEVOTHYROXINE SODIUM 112 MCG PO TABS
112.0000 ug | ORAL_TABLET | Freq: Every day | ORAL | Status: DC
Start: 2014-12-30 — End: 2015-01-01
  Administered 2014-12-30 – 2015-01-01 (×3): 112 ug via ORAL
  Filled 2014-12-30 (×5): qty 1

## 2014-12-30 MED ORDER — ONDANSETRON HCL 4 MG/2ML IJ SOLN: 4.0000 mg | Freq: Four times a day (QID) | INTRAMUSCULAR | Status: DC | PRN

## 2014-12-30 MED ORDER — AMIODARONE HCL 200 MG PO TABS
400.0000 mg | ORAL_TABLET | Freq: Every day | ORAL | Status: DC
  Administered 2014-12-30 – 2015-01-01 (×3): 400 mg via ORAL
  Filled 2014-12-30 (×3): qty 2

## 2014-12-30 MED ORDER — MIDODRINE HCL 2.5 MG PO TABS
2.5000 mg | ORAL_TABLET | Freq: Three times a day (TID) | ORAL | Status: DC
  Administered 2014-12-30 – 2015-01-01 (×8): 2.5 mg via ORAL
  Filled 2014-12-30 (×11): qty 1

## 2014-12-30 MED ORDER — ONDANSETRON HCL 4 MG PO TABS: 4.0000 mg | ORAL_TABLET | Freq: Four times a day (QID) | ORAL | Status: DC | PRN

## 2014-12-30 MED ORDER — ENOXAPARIN SODIUM 40 MG/0.4ML ~~LOC~~ SOLN
40.0000 mg | SUBCUTANEOUS | Status: DC
  Administered 2014-12-30 – 2015-01-01 (×3): 40 mg via SUBCUTANEOUS
  Filled 2014-12-30 (×3): qty 0.4

## 2014-12-30 MED ORDER — MELOXICAM 7.5 MG PO TABS
7.5000 mg | ORAL_TABLET | Freq: Every day | ORAL | Status: DC
  Administered 2014-12-30 – 2015-01-01 (×3): 7.5 mg via ORAL
  Filled 2014-12-30 (×3): qty 1

## 2014-12-30 MED ORDER — CLOPIDOGREL BISULFATE 75 MG PO TABS
75.0000 mg | ORAL_TABLET | Freq: Every day | ORAL | Status: DC
  Administered 2014-12-30 – 2015-01-01 (×3): 75 mg via ORAL
  Filled 2014-12-30 (×3): qty 1

## 2014-12-30 MED ORDER — FAMOTIDINE 20 MG PO TABS
20.0000 mg | ORAL_TABLET | Freq: Every day | ORAL | Status: DC
  Administered 2014-12-30 – 2014-12-31 (×2): 20 mg via ORAL
  Filled 2014-12-30 (×3): qty 1

## 2014-12-30 MED ORDER — TORSEMIDE 10 MG PO TABS
10.0000 mg | ORAL_TABLET | Freq: Every day | ORAL | Status: DC | PRN
  Filled 2014-12-30: qty 1

## 2014-12-30 MED ORDER — COLCHICINE 0.6 MG PO TABS
0.6000 mg | ORAL_TABLET | Freq: Every day | ORAL | Status: DC
  Administered 2014-12-30 – 2015-01-01 (×3): 0.6 mg via ORAL
  Filled 2014-12-30 (×3): qty 1

## 2014-12-30 NOTE — Care Management Note (Signed)
Case Management Note  Patient Details  Name: TRENIDAD GRIZZEL MRN: [id number] Date of Birth: 03/20/1926  Subjective/Objective:  Provided w/HHC agency, patient/spouse unsure which agency already active w/ but dtr knows-she will let CM know.Will need HHRN/PT/ST orders, [id number].                  Action/Plan:d/c ome w/HHC.   Expected Discharge Date:                  Expected Discharge Plan:  [place]  In-House Referral:     Discharge planning Services  CM Consult  Post Acute Care Choice:  Home Health (Per spouse already active w/HHRN/PT/ST but unsure of agency.dtr knows.) Choice offered to:     DME Arranged:    DME Agency:     HH Arranged:    HH Agency:     Status of Service:  In process, will continue to follow  Medicare Important Message Given:    Date Medicare IM Given:    Medicare IM give by:    Date Additional Medicare IM Given:    Additional Medicare Important Message give by:     If discussed at [place] of Stay Meetings, dates discussed:    Additional Comments:  Dessa Phi, RN 12/30/2014, 4:25 PM

## 2014-12-30 NOTE — Evaluation (Signed)
Physical Therapy Evaluation Patient Details Name: Adam [name] MRN: [id number] DOB: [birth year] Today's Date: 12/30/2014   History of Present Illness  Pt is an 81 y.o. male with PMH consisting of CAD, CABG stents 2012, pacemaker, cardiomyopathy, orthostatic hypertension, GERD, and 4 year history of dizziness. He presents to the ED with Abd pain, bowel impaction and urinary retention.  Clinical Impression  Pt admitted as above and presenting with functional mobility limitations 2* generalized weakness, ambulatory instability with fear of falling, and cognitive status.  Pt and spouse hope to progress to dc to home with family assist and continued follow up HHPT    Follow Up Recommendations Home health PT    Equipment Recommendations  None recommended by PT    Recommendations for Other Services OT consult     Precautions / Restrictions Precautions Precautions: Fall Restrictions Weight Bearing Restrictions: No      Mobility  Bed Mobility Overal bed mobility: Needs Assistance Bed Mobility: Supine to Sit;Rolling Rolling: Mod assist   Supine to sit: Mod assist     General bed mobility comments: Pt ltd by fear of falling OOB.  Transfers Overall transfer level: Needs assistance Equipment used: Rolling walker (2 wheeled) Transfers: Sit to/from Stand Sit to Stand: Mod assist;+2 safety/equipment         General transfer comment: cues for safe transition position and use of UEs to self assist  Ambulation/Gait Ambulation/Gait assistance: Min assist;+2 physical assistance;+2 safety/equipment Ambulation Distance (Feet): 40 Feet Assistive device: Rolling walker (2 wheeled) Gait Pattern/deviations: Step-to pattern;Step-through pattern;Decreased step length - right;Decreased step length - left;Shuffle;Trunk flexed Gait velocity: decr   General Gait Details: cues for sequence, posture and position from RW.  Stairs            Wheelchair Mobility    Modified Rankin  (Stroke Patients Only)       Balance Overall balance assessment: Needs assistance Sitting-balance support: Bilateral upper extremity supported;Feet supported Sitting balance-Leahy Scale: Fair Sitting balance - Comments: Initial posterior lean progressing to IND sitting sans UE support.  Pt with fear of falling from bed 2* 2 previous episodes                                     Pertinent Vitals/Pain Pain Assessment: Faces Faces Pain Scale: Hurts little more Pain Location: abd pain Pain Descriptors / Indicators: Cramping Pain Intervention(s): Limited activity within patient's tolerance;Monitored during session    [place] expects to be discharged to:: Private residence Living Arrangements: Spouse/significant other Available Help at Discharge: Family;Available 24 hours/day Type of Home: House Home Access: Stairs to enter Entrance Stairs-Rails: None Entrance Stairs-Number of Steps: 1 Home Layout: One level Home Equipment: Environmental consultant - 2 wheels      Prior Function Level of Independence: Independent with assistive device(s)               Hand Dominance   Dominant Hand: Right    Extremity/Trunk Assessment   Upper Extremity Assessment: Generalized weakness           Lower Extremity Assessment: Generalized weakness      Cervical / Trunk Assessment: Kyphotic  Communication   Communication: No difficulties  Cognition Arousal/Alertness: Awake/alert Behavior During Therapy: WFL for tasks assessed/performed Overall Cognitive Status: History of cognitive impairments - at baseline       Memory: Decreased short-term memory  General Comments      Exercises        Assessment/Plan    PT Assessment Patient needs continued PT services  PT Diagnosis Difficulty walking   PT Problem List Decreased strength;Decreased activity tolerance;Decreased balance;Decreased mobility;Decreased knowledge of use of  DME;Pain;Decreased safety awareness;Decreased cognition  PT Treatment Interventions Gait training;DME instruction;Stair training;Functional mobility training;Therapeutic activities;Therapeutic exercise;Balance training;Patient/family education   PT Goals (Current goals can be found in the Care Plan section) Acute Rehab PT Goals Patient Stated Goal: home PT Goal Formulation: With patient/family Time For Goal Achievement: 01/06/15 Potential to Achieve Goals: Fair    Frequency Min 3X/week   Barriers to discharge        Co-evaluation               End of Session Equipment Utilized During Treatment: Gait belt Activity Tolerance: Patient limited by fatigue;Other (comment) (c/o dizziness - BP 99/48) Patient left: in chair;with call bell/phone within reach;with family/visitor present;with chair alarm set Nurse Communication: Mobility status    Functional Assessment Tool Used: Clinical judgement Functional Limitation: Mobility: Walking and moving around Mobility: Walking and Moving Around Current Status [id number]): At least 20 percent but less than 40 percent impaired, limited or restricted Mobility: Walking and Moving Around Goal Status [phone]): At least 1 percent but less than 20 percent impaired, limited or restricted    Time: [id number] PT Time Calculation (min) (ACUTE ONLY): 38 min   Charges:   PT Evaluation $Initial PT Evaluation Tier I: 1 Procedure PT Treatments $Gait Training: 8-22 mins   PT G Codes:   PT G-Codes **NOT FOR INPATIENT CLASS** Functional Assessment Tool Used: Clinical judgement Functional Limitation: Mobility: Walking and moving around Mobility: Walking and Moving Around Current Status [id number]): At least 20 percent but less than 40 percent impaired, limited or restricted Mobility: Walking and Moving Around Goal Status [phone]): At least 1 percent but less than 20 percent impaired, limited or restricted    [facility] 12/30/2014, 2:24 PM

## 2014-12-30 NOTE — Progress Notes (Signed)
Physical Therapy Treatment Patient Details Name: Adam [name] MRN: [id number] DOB: 19-Jun-1926 Today's Date: 12/30/2014    History of Present Illness Pt is an 81 y.o. male with PMH consisting of CAD, CABG stents 2012, pacemaker, cardiomyopathy, orthostatic hypertension, GERD, and 4 year history of dizziness. He presents to the ED with Abd pain, bowel impaction and urinary retention.    PT Comments    Pt very cooperative but with difficulty following cues and with decreased safety awareness.  Follow Up Recommendations  Home health PT     Equipment Recommendations  None recommended by PT    Recommendations for Other Services OT consult     Precautions / Restrictions Precautions Precautions: Fall Restrictions Weight Bearing Restrictions: No    Mobility  Bed Mobility Overal bed mobility: Needs Assistance Bed Mobility: Supine to Sit;Rolling Rolling: Mod assist   Supine to sit: Mod assist     General bed mobility comments: Pt ltd by fear of falling OOB.  Transfers Overall transfer level: Needs assistance Equipment used: None Transfers: Sit to/from Stand Sit to Stand: Min assist;+2 safety/equipment Stand pivot transfers: Min assist;+2 safety/equipment       General transfer comment: cues for safe transition position and use of UEs to self assist  Ambulation/Gait Ambulation/Gait assistance: Min assist;+2 safety/equipment Ambulation Distance (Feet): 8 Feet Assistive device: Rolling walker (2 wheeled) Gait Pattern/deviations: Step-to pattern;Decreased step length - right;Decreased step length - left;Shuffle;Trunk flexed Gait velocity: decr   General Gait Details: cues for sequence, posture and position from RW.   Stairs            Wheelchair Mobility    Modified Rankin (Stroke Patients Only)       Balance Overall balance assessment: Needs assistance Sitting-balance support: Bilateral upper extremity supported;Feet supported Sitting balance-Leahy  Scale: Fair Sitting balance - Comments: Initial posterior lean progressing to IND sitting sans UE support.  Pt with fear of falling from bed 2* 2 previous episodes                            Cognition Arousal/Alertness: Awake/alert Behavior During Therapy: WFL for tasks assessed/performed Overall Cognitive Status: History of cognitive impairments - at baseline       Memory: Decreased short-term memory              Exercises      General Comments        Pertinent Vitals/Pain Pain Assessment: Faces Faces Pain Scale: Hurts little more Pain Location: abd pain Pain Descriptors / Indicators: Cramping Pain Intervention(s): Limited activity within patient's tolerance;Monitored during session    Home Living Family/patient expects to be discharged to:: Private residence Living Arrangements: Spouse/significant other Available Help at Discharge: Family;Available 24 hours/day Type of Home: House Home Access: Stairs to enter Entrance Stairs-Rails: None Home Layout: One level Home Equipment: Environmental consultant - 2 wheels      Prior Function Level of Independence: Independent with assistive device(s)          PT Goals (current goals can now be found in the care plan section) Acute Rehab PT Goals Patient Stated Goal: home PT Goal Formulation: With patient/family Time For Goal Achievement: 01/06/15 Potential to Achieve Goals: Fair Progress towards PT goals: Progressing toward goals    Frequency  Min 3X/week    PT Plan Current plan remains appropriate    Co-evaluation             End of Session Equipment Utilized During Treatment:  Gait belt Activity Tolerance: Patient tolerated treatment well Patient left: in chair;with call bell/phone within reach;with family/visitor present;with chair alarm set     Time: 1350-1412 PT Time Calculation (min) (ACUTE ONLY): 22 min  Charges:  $Gait Training: 8-22 mins $Therapeutic Activity: 8-22 mins                    G  Codes:  Functional Assessment Tool Used: Clinical judgement Functional Limitation: Mobility: Walking and moving around Mobility: Walking and Moving Around Current Status [id number]): At least 20 percent but less than 40 percent impaired, limited or restricted Mobility: Walking and Moving Around Goal Status [phone]): At least 1 percent but less than 20 percent impaired, limited or restricted   [facility] 12/30/2014, 2:32 PM

## 2014-12-30 NOTE — ED Notes (Signed)
Patient states that he is nauseated and requested ginger ale.  Patient given ginger ale and will reassess.

## 2014-12-30 NOTE — Progress Notes (Signed)
   12/30/14 1754  SLP G-Codes **NOT FOR INPATIENT CLASS**  Functional Assessment Tool Used clinical judgement  Functional Limitations Spoken language expressive  Spoken Language Expression Current Status [phone]) CJ  Spoken Language Expression Goal Status [id number]) CJ  Spoken Language Expression Discharge Status [id number]) CJ  SLP Evaluations  $ SLP Speech Visit 1 Procedure  SLP Evaluations  $Speech Treatment for Individual 1 Procedure  $ SLP EVAL LANGUAGE/SOUND PRODUCTION 1 Procedure  Luanna Salk, [place] [facility] SLP [phone]

## 2014-12-30 NOTE — Evaluation (Addendum)
Speech Language Pathology Evaluation Patient Details Name: Adam [name] MRN: [id number] DOB: [birth year] Today's Date: 12/30/2014 Time: [id number] SLP Time Calculation (min) (ACUTE ONLY): 60 min  Problem List:  Patient Active Problem List   Diagnosis Date Noted  . Urinary retention 12/30/2014  . Coronary artery disease with hx of myocardial infarct w/o hx of CABG 12/30/2014  . Fecal impaction ([place]) 12/30/2014  . Expressive aphasia 12/30/2014  . Leukocytosis 12/30/2014  . Constipation   . Fecal impaction in rectum ([place])   . Arterial hypotension   . SOB (shortness of breath) 12/03/2014  . Dehydration 12/03/2014  . Knee pain, acute 12/03/2014  . Shortness of breath 12/03/2014  . GERD (gastroesophageal reflux disease)   . Hypertension   . Dyspnea 08/11/2014  . Hypothyroidism 08/11/2014  . Sustained ventricular tachycardia ([place]) 07/27/2014  . Upper airway cough syndrome 07/14/2014  . Hypokalemia 01/31/2014  . Sick sinus syndrome ([place]) 01/31/2014  . Hyperlipidemia 01/30/2014  . Chest pain 01/06/2013  . Arm pain, possible anginal equivialnt  12/16/2012  . Cardiomyopathy, ischemic 08/25/2012  . Pacemaker 08/25/2012  . Hypotension, resolved with fluid bolus 06/07/2011  . Chest tightness, negative MI, Negative Lexiscan myoview 06/05/2011  . Nausea 06/05/2011  . Dizziness, after diuretic asscoiated with hypotension and responded to fluid bolus 06/05/2011  . CAD (coronary artery disease)   . Bradycardia    Past Medical History:  Past Medical History  Diagnosis Date  . CAD (coronary artery disease)     CABG (LIMA-LAD, SVG-RCA, SVG-OM in 1996).  07/2009 BMS to SVG-RCA. Cath in 04/2010 with patent stents   . Bradycardia     AFib/SSS s/p St Jude PPM 04/12/2008  . Hypothyroid   . Pacemaker   . Hyperlipidemia   . GERD (gastroesophageal reflux disease)   . Hiatal hernia   . Insomnia   . S/P CABG x 4   . RBBB   . Melanoma of back ([place]) 1976  . Heart murmur     "just told I had one  today" (12/15/2012)  . Myocardial infarction [facility]) 1996; 2011    "both silent" (12/15/2012)  . Exertional shortness of breath     "sometimes walking" (12/15/2012)  . Arthritis     "minor, back and sometimes knees" (12/15/2012)  . Sustained ventricular tachycardia ([place]) 07/27/2014  . Hypertension   . [place] spotted fever    Past Surgical History:  Past Surgical History  Procedure Laterality Date  . US echocardiography  07/11/2009    EF 45-50%  . Nm myoview ltd  06/2011    low risk  . Coronary artery bypass graft  1996    LIMA to LAD,SVG to RCA & SVG to OM  . Tonsillectomy  1938  . Appendectomy  1942  . Coronary angioplasty with stent placement  07/2009    bare metal stent to SVG to the RCA  . Cardiac catheterization  04/2010    LIMA to LAD patent,SVG to OM patent,no in-stnet restenosis RCA  . Cataract extraction w/ intraocular lens  implant, bilateral Bilateral 2012  . Melanoma excision  05/1974 X2    "taken off my back" (12/15/2012)  . Transurethral resection of prostate  1986  . Insert / replace / remove pacemaker  2010   HPI:  Pt is an 81 y.o. male with PMH consisting of CAD, CABG stents 2012, pacemaker, cardiomyopathy, orthostatic hypertension, GERD, and 4 year history of dizziness. He presents to the ED with Abd pain, bowel impaction and urinary retention.  Pt reports  difficulty with expressing language and understanding within the last few months.  He had a CT head negative for acute event.  Pt reports he is receiving home health SLP to address communication and is finding it helpful.  Wife present reports more delay in expression currently than prior to this illness.  He has only had two hours of sleep last pm and pt/wife question if this is contributing to symptoms.     Assessment / Plan / Recommendation Clinical Impression  Pt presents with moderate cognitive linguistic difficulties which his spouse reports is mildly worse currently than prior to admit.  He is able to  meet his needs verbally and participate in his medical care however.  Expressive language deficits resulted in dyfluencies and decreased semantics resulting in this listener needing to use context to fully understand pt.  Functional expressive communication of novel topic was most difficult for pt.    Pt named 4 words starting with a specific letter in 60 seconds - normal being 11.  Pt's words were higher level semantically - eg magnificant, marvelous, etc.  He benefited from description cues to state basic items however = eg; mouse, monkey, marshmallow.      Receptive skills were compromised beyond 2 step directions - suspect attention impacting pt as well.    Wife reports pt receiving home health SLP prior to this admit and they find it helpful and would like to continue upon dc.     SLP provided pt and wife with compensation strategies to mitigate his communication difficulties.  Also provided them with exercises to practice word finding, expressive & receptive language skills.    No follow up in acute care setting indicated as all education completed with wife/pt.       SLP Assessment  All further Speech Lanaguage Pathology  needs can be addressed in the next venue of care    Follow Up Recommendations  Home health SLP    Frequency and Duration   n/a        SLP Evaluation Prior Functioning  Cognitive/Linguistic Baseline: Baseline deficits Baseline deficit details: see hhx Type of Home: House  Lives With: Spouse Available Help at Discharge: Family;Available 24 hours/day Education: Master's level in religion Vocation: Retired   Associate Professor  Overall Cognitive Status: Difficult to assess Arousal/Alertness: Awake/alert Attention: Sustained Sustained Attention: Impaired Sustained Attention Impairment: Functional basic;Verbal basic Memory: Impaired Memory Impairment: Storage deficit;Decreased recall of new information (pt did not recall that he repeated the same story to SLP during  one hour session) Awareness: Appears intact Problem Solving: Appears intact (for current environment, pt compensates for his language deficits by using elaborate semantics) Safety/Judgment: Appears intact Comments: pt reports his wife has been managing his medications, family finances, appts, etc within the last few months due to his illness, he is agreeable to allow her to continue to do so if needed    Comprehension  Auditory Comprehension Overall Auditory Comprehension: Impaired at baseline Yes/No Questions: Not tested Commands: Impaired Two Step Basic Commands: 75-100% accurate Multistep Basic Commands: 50-74% accurate Conversation: Complex EffectiveTechniques: Extra processing time;Repetition;Slowed speech Visual Recognition/Discrimination Discrimination: Not tested Reading Comprehension Reading Status: Not tested    Expression Expression Primary Mode of Expression: Verbal Verbal Expression Overall Verbal Expression: Impaired Initiation: No impairment Level of Generative/Spontaneous Verbalization: Sentence;Conversation Repetition: Impaired Level of Impairment: Sentence level Naming: No impairment (for naming basic pictures) Pragmatics: No impairment Interfering Components: Attention;Premorbid deficit Effective Techniques: Open ended questions;Other (Comment) (redirecting pt to topic) Non-Verbal Means of Communication: Not  applicable Other Verbal Expression Comments: expressive language deficits resulted in dyfluencies and decreased semantics resulting in this listener needing to use context to fully understand pt, pt is educated at an advanced and his vocabulary is higher level - however functional expressive communication is difficult Written Expression Dominant Hand: Right Written Expression: Not tested   Oral / Motor Oral Motor/Sensory Function Overall Oral Motor/Sensory Function: Within functional limits Motor Speech Overall Motor Speech: Appears within functional  limits for tasks assessed Respiration: Within functional limits Resonance: Within functional limits Articulation: Within functional limitis Intelligibility: Intelligible Motor Planning: Witnin functional limits    Claudie Fisherman, [place] [facility] SLP [phone]

## 2014-12-30 NOTE — Care Management Note (Signed)
Case Management Note  Patient Details  Name: DYLIN HALLAK MRN: [id number] Date of Birth: 18-Oct-1926  Subjective/Objective:  81 y/o m admitted w/Urinary retention. PT-recc HHPT. Will provide [place] agency list-await choice.                  Action/Plan:d/c home w/HHPT.   Expected Discharge Date:                  Expected Discharge Plan:  [place]  In-House Referral:     Discharge planning Services  CM Consult  Post Acute Care Choice:    Choice offered to:     DME Arranged:    DME Agency:     HH Arranged:    [place] Agency:     Status of Service:  In process, will continue to follow  Medicare Important Message Given:    Date Medicare IM Given:    Medicare IM give by:    Date Additional Medicare IM Given:    Additional Medicare Important Message give by:     If discussed at [place] of Stay Meetings, dates discussed:    Additional Comments:  Dessa Phi, RN 12/30/2014, 4:06 PM

## 2014-12-30 NOTE — Progress Notes (Signed)
Patient was seen and evaluated earlier this morning by my associate. Please refer to H&P for details regarding assessment and plan.  Patient seen and evaluated Gen. patient in no acute distress Cardiovascular no cyanosis Pulmonary: No increased work of breathing no wheezes equal chest rise Abdomen: Soft, nontender, no rebound tenderness  Will plan on administering soapsuds enema to assist with BMs.  Assess next am  [place], Linward Foster

## 2014-12-31 ENCOUNTER — Encounter (HOSPITAL_COMMUNITY): Payer: Self-pay | Admitting: *Deleted

## 2014-12-31 DIAGNOSIS — R339 Retention of urine, unspecified: Secondary | ICD-10-CM | POA: Diagnosis not present

## 2014-12-31 NOTE — Progress Notes (Signed)
Physical Therapy Treatment Patient Details Name: Adam [name] MRN: [id number] DOB: 1926/08/07 Today's Date: 12/31/2014    History of Present Illness Pt is an 81 y.o. male with PMH consisting of CAD, CABG stents 2012, pacemaker, cardiomyopathy, orthostatic hypertension, GERD, and 4 year history of dizziness. He presents to the ED with Abd pain, bowel impaction and urinary retention.    PT Comments    Pt very pleasant and cooperative but with functional mobility limitations 2* cognitive deficits, balance deficits, bowel incontinence and poor safety awareness.  As well, pt continues to require significant assist to complete mobility tasks safely.  Pt's dtr and son-in-law present this date and advising concerns 2* primary support for pt being elderly wife.  At this time, pt's spouse would not be able to provide assist level needed to manage pt safely at home.  Pt would greatly benefit from follow up rehab at SNF level to maximize IND and safety.   Follow Up Recommendations  SNF     Equipment Recommendations  None recommended by PT    Recommendations for Other Services OT consult     Precautions / Restrictions Precautions Precautions: Fall Restrictions Weight Bearing Restrictions: No    Mobility  Bed Mobility Overal bed mobility: Needs Assistance Bed Mobility: Supine to Sit;Rolling Rolling: Mod assist   Supine to sit: Mod assist     General bed mobility comments: Pt ltd by fear of falling OOB.  Transfers Overall transfer level: Needs assistance Equipment used: Rolling walker (2 wheeled) Transfers: Sit to/from Stand Sit to Stand: Min assist;+2 safety/equipment;From elevated surface         General transfer comment: cues for safe transition position and use of UEs to self assist  Ambulation/Gait Ambulation/Gait assistance: Min assist;+2 safety/equipment Ambulation Distance (Feet): 140 Feet (twice) Assistive device: Rolling walker (2 wheeled) Gait Pattern/deviations:  Step-to pattern;Step-through pattern;Shuffle;Trunk flexed     General Gait Details: cues for sequence, posture and position from RW.   Stairs            Wheelchair Mobility    Modified Rankin (Stroke Patients Only)       Balance Overall balance assessment: Needs assistance Sitting-balance support: Feet supported;Bilateral upper extremity supported Sitting balance-Leahy Scale: Fair     Standing balance support: Bilateral upper extremity supported Standing balance-Leahy Scale: Poor                      Cognition Arousal/Alertness: Awake/alert Behavior During Therapy: WFL for tasks assessed/performed Overall Cognitive Status: History of cognitive impairments - at baseline       Memory: Decreased short-term memory              Exercises      General Comments        Pertinent Vitals/Pain Pain Assessment: Faces Faces Pain Scale: Hurts even more Pain Location: anus with BM and intermittent abd pain Pain Descriptors / Indicators: Aching;Sharp;Sore Pain Intervention(s): Monitored during session;Limited activity within patient's tolerance    Home Living                      Prior Function            PT Goals (current goals can now be found in the care plan section) Acute Rehab PT Goals Patient Stated Goal: home PT Goal Formulation: With patient/family (Dtr and son-in-law present this date in addition to spouse) Time For Goal Achievement: 01/06/15 Potential to Achieve Goals: Fair Progress towards PT goals: Progressing toward  goals    Frequency  Min 3X/week    PT Plan Discharge plan needs to be updated    Co-evaluation             End of Session Equipment Utilized During Treatment: Gait belt Activity Tolerance: Patient tolerated treatment well Patient left: in chair;with call bell/phone within reach;with family/visitor present;with chair alarm set     Time: [id number] PT Time Calculation (min) (ACUTE ONLY): 41  min  Charges:  $Gait Training: 23-37 mins $Therapeutic Activity: 8-22 mins                    G Codes:      Adam [name] 2015/01/27, 3:21 PM

## 2014-12-31 NOTE — Care Management (Signed)
Tiffany at [facility] notified of patient's current status in the hospital. [facility] will continue to follow for discharge. Venita Sheffield RN CCM

## 2014-12-31 NOTE — NC FL2 (Signed)
[place] LEVEL OF CARE SCREENING TOOL     IDENTIFICATION  Patient Name: Adam [name] Birthdate: 03/27/1926 Sex: male Admission Date (Current Location): 12/29/2014  [facility] and [place] Number: Herbalist and Address:  [facility],  [place] [address], [place]      Provider Number: [id number]  Attending Physician Name and Address:  Velvet Bathe, MD  Relative Name and Phone Number:       Current Level of Care: Hospital Recommended Level of Care: [place] Prior Approval Number:    Date Approved/Denied:   PASRR Number: [id number]  Discharge Plan: SNF    Current Diagnoses: Patient Active Problem List   Diagnosis Date Noted  . Urinary retention 12/30/2014  . Coronary artery disease with hx of myocardial infarct w/o hx of CABG 12/30/2014  . Fecal impaction ([place]) 12/30/2014  . Expressive aphasia 12/30/2014  . Leukocytosis 12/30/2014  . Constipation   . Fecal impaction in rectum ([place])   . Arterial hypotension   . SOB (shortness of breath) 12/03/2014  . Dehydration 12/03/2014  . Knee pain, acute 12/03/2014  . Shortness of breath 12/03/2014  . GERD (gastroesophageal reflux disease)   . Hypertension   . Dyspnea 08/11/2014  . Hypothyroidism 08/11/2014  . Sustained ventricular tachycardia ([place]) 07/27/2014  . Upper airway cough syndrome 07/14/2014  . Hypokalemia 01/31/2014  . Sick sinus syndrome ([place]) 01/31/2014  . Hyperlipidemia 01/30/2014  . Chest pain 01/06/2013  . Arm pain, possible anginal equivialnt  12/16/2012  . Cardiomyopathy, ischemic 08/25/2012  . Pacemaker 08/25/2012  . Hypotension, resolved with fluid bolus 06/07/2011  . Chest tightness, negative MI, Negative Lexiscan myoview 06/05/2011  . Nausea 06/05/2011  . Dizziness, after diuretic asscoiated with hypotension and responded to fluid bolus 06/05/2011  . CAD (coronary artery disease)   . Bradycardia     Orientation ACTIVITIES/SOCIAL BLADDER  RESPIRATION    Self, Time, Situation, Place    Continent Normal  BEHAVIORAL SYMPTOMS/MOOD NEUROLOGICAL BOWEL NUTRITION STATUS      Incontinent Diet (heart healthy)  PHYSICIAN VISITS COMMUNICATION OF NEEDS Height & Weight Skin          PU Stage and Appropriate Care ([place])          AMBULATORY STATUS RESPIRATION    Supervision limited Normal      Personal Care Assistance Level of Assistance  Dressing, Bathing Bathing Assistance: Limited assistance   Dressing Assistance: Limited assistance      Functional Limitations Info                SPECIAL CARE FACTORS FREQUENCY                      Additional Factors Info  Allergies   Allergies Info: Crestor, Altace, Bactrim, Penicillins, Sulfa Antibiotics           Current Medications (12/31/2014): Current Facility-Administered Medications  Medication Dose Route Frequency Provider Last Rate Last Dose  . ALPRAZolam Duanne Moron) tablet 0.5 mg  0.5 mg Oral Daily PRN [name], MD   0.5 mg at 12/31/14 1045  . amiodarone (PACERONE) tablet 400 mg  400 mg Oral Daily [name], MD   400 mg at 12/31/14 1033  . aspirin EC tablet 81 mg  81 mg Oral Daily [name], MD   81 mg at 12/31/14 1033  . [START ON 01/02/2015] cholecalciferol (VITAMIN D) tablet 5,000 Units  5,000 Units Oral Q Mon [name], MD      .  clopidogrel (PLAVIX) tablet 75 mg  75 mg Oral Daily [name], MD   75 mg at 12/31/14 1033  . colchicine tablet 0.6 mg  0.6 mg Oral Daily [name], MD   0.6 mg at 12/31/14 1033  . enoxaparin (LOVENOX) injection 40 mg  40 mg Subcutaneous Q24H [name], MD   40 mg at 12/31/14 1033  . famotidine (PEPCID) tablet 20 mg  20 mg Oral QHS [name], MD   20 mg at 12/30/14 2114  . ipratropium (ATROVENT) 0.06 % nasal spray 2 spray  2 spray Each Nare BID PRN [name], MD   2 spray at 12/31/14 1032  . levothyroxine (SYNTHROID, LEVOTHROID) tablet 112 mcg  112 mcg Oral QAC breakfast  [name], MD   112 mcg at 12/31/14 0855  . meloxicam (MOBIC) tablet 7.5 mg  7.5 mg Oral Daily [name], MD   7.5 mg at 12/31/14 1033  . midodrine (PROAMATINE) tablet 2.5 mg  2.5 mg Oral TID WC Rondell Charmayne Sheer, MD   2.5 mg at 12/31/14 1210  . nystatin (MYCOSTATIN/NYSTOP) topical powder   Topical BID [name], MD      . ondansetron (ZOFRAN) tablet 4 mg  4 mg Oral Q6H PRN [name], MD       Or  . ondansetron (ZOFRAN) injection 4 mg  4 mg Intravenous Q6H PRN Rondell A Tamala Julian, MD      . pantoprazole (PROTONIX) EC tablet 40 mg  40 mg Oral BID [name], MD   40 mg at 12/31/14 1033  . polyethylene glycol (MIRALAX / GLYCOLAX) packet 17 g  17 g Oral Daily PRN [name], MD      . pravastatin (PRAVACHOL) tablet 40 mg  40 mg Oral q1800 [name], MD   40 mg at 12/30/14 1719  . torsemide (DEMADEX) tablet 10 mg  10 mg Oral Daily PRN [name], MD      . traZODone (DESYREL) tablet 25 mg  25 mg Oral QHS PRN [name], MD   25 mg at 12/30/14 2114   Do not use this list as official medication orders. Please verify with discharge summary.  Discharge Medications:   Medication List    ASK your doctor about these medications        ALPRAZolam 0.5 MG tablet  Commonly known as:  XANAX  Take 0.5 mg by mouth daily as needed for anxiety or sleep.     amiodarone 400 MG tablet  Commonly known as:  PACERONE  Take 1 tablet (400 mg total) by mouth daily.     AMITIZA 8 MCG capsule  Generic drug:  lubiprostone  Take 8 mcg by mouth 2 (two) times daily.     aspirin 81 MG EC tablet  Take 1 tablet (81 mg total) by mouth daily.     bisacodyl 5 MG EC tablet  Commonly known as:  DULCOLAX  Take 5 mg by mouth daily as needed for moderate constipation.     clobetasol ointment 0.05 %  Commonly known as:  TEMOVATE  Apply 1 application topically 2 (two) times daily.     clopidogrel 75 MG tablet  Commonly known as:  PLAVIX  Take 75 mg by mouth daily.      colchicine 0.6 MG tablet  Take 1 tablet (0.6 mg total) by mouth daily.     famotidine 20 MG tablet  Commonly known as:  PEPCID  Take 20 mg by mouth at bedtime.     gabapentin 100 MG capsule  Commonly known as:  NEURONTIN  Take 1 pill nightly at bedtime for 1 week, then 2 pills nightly for 1 week, then 3 pills each night thereafter     ipratropium 0.06 % nasal spray  Commonly known as:  ATROVENT  Place 2 sprays into both nostrils 2 (two) times daily as needed for rhinitis.     levothyroxine 112 MCG tablet  Commonly known as:  SYNTHROID, LEVOTHROID  Take 112 mcg by mouth daily before breakfast.     magnesium citrate Soln  Take 0.5 Bottles by mouth once.     meloxicam 7.5 MG tablet  Commonly known as:  MOBIC  Take 1 tablet (7.5 mg total) by mouth daily.     midodrine 2.5 MG tablet  Commonly known as:  PROAMATINE  Take 1 tablet (2.5 mg total) by mouth 3 (three) times daily with meals. Take third dose at least 6 hours before bedtime     nystatin 100000 UNIT/GM Powd  APPLY TWICE DAILY TO YOUR SCROTUM AND GENITAL AREA TO COVER THE AREA     pantoprazole 40 MG tablet  Commonly known as:  PROTONIX  TAKE 1 TABLET BY MOUTH TWICE A DAY     polyethylene glycol packet  Commonly known as:  MIRALAX / GLYCOLAX  Take 17 g by mouth daily as needed (constipation).     pravastatin 40 MG tablet  Commonly known as:  PRAVACHOL  Take 40 mg by mouth daily.     rOPINIRole 0.5 MG tablet  Commonly known as:  REQUIP  Take 0.5-1 mg by mouth every evening.     torsemide 20 MG tablet  Commonly known as:  DEMADEX  Take 1 tablet (20 mg total) by mouth daily.     traZODone 50 MG tablet  Commonly known as:  DESYREL  Take 0.5 tablets (25 mg total) by mouth at bedtime as needed for sleep.     Vitamin D3 5000 UNITS Caps  Take 1 capsule by mouth every Monday.        Relevant Imaging Results:  Relevant Lab Results:  Recent Labs    Additional Information    Carlean Jews,  LCSW

## 2014-12-31 NOTE — Clinical Social Work Note (Signed)
CSW received a call from RN stating that pt and family have decided they want SNF at rehab.  CSW met with family and provided information.  CSW requested [facility]. CSW sent pt information through the [facility].  CSW will follow up with bed offers Tomorrow.  Dede Query, LCSW [place] Worker - Weekend Coverage cell #: [phone]

## 2014-12-31 NOTE — Progress Notes (Signed)
[facility] PROGRESS NOTE  AGASTYA LUPE [id number] DOB: [birth year] DOA: 12/29/2014 PCP: Horatio Pel, MD  Assessment/Plan: Principal Problem:   Urinary retention - May have been 2ary to constipation. - will attempt voiding trial today and remove foley.    Fecal impaction ([place]) - resolved patient reportedly had many bowel movements yesterday after impaction was cleared in the ED. Nurse reports she did not have to administer enema  Active Problems:   Cardiomyopathy, ischemic - stable continue home medication regimen. stable   Pacemaker   Hyperlipidemia -stable on statin   Hypothyroidism - continue synthroid, currently stable. TSH results reviewed in chart and WNL   GERD (gastroesophageal reflux disease) - stable on famotidine    Expressive aphasia   Leukocytosis - resolved off of antibiotics  Code Status: full Family Communication: discussed with patient and family  Disposition Plan:    Consultants:  None  Procedures:  None  Antibiotics:  None   HPI/Subjective: Pt has no new complaints.  Objective: Filed Vitals:   12/30/14 2206 12/31/14 0533  BP: 93/60 110/59  Pulse: 69 70  Temp: 98.3 F (36.8 C) 98.7 F (37.1 C)  Resp: 20 19    Intake/Output Summary (Last 24 hours) at 12/31/14 1435 Last data filed at 12/31/14 1300  Gross per 24 hour  Intake      0 ml  Output   3525 ml  Net  -3525 ml   There were no vitals filed for this visit.  Exam:   General:  Pt in nad, alert and awake  Cardiovascular: rrr, no mrg  Respiratory: no increased wob,   Abdomen: nd, no guarding  Musculoskeletal: no cyansosis   Data Reviewed: Basic Metabolic Panel:  Recent Labs Lab 12/29/14 1455 12/30/14 0524  NA 137 138  K 4.3 4.2  CL 104 105  CO2 27 27  GLUCOSE 123* 132*  BUN 20 15  CREATININE 0.98 0.83  CALCIUM 9.1 8.7*   Liver Function Tests:  Recent Labs Lab 12/29/14 1455  AST 27  ALT 29  ALKPHOS 65  BILITOT 0.6  PROT 7.0   ALBUMIN 3.9    Recent Labs Lab 12/29/14 1455  LIPASE 28   No results for input(s): AMMONIA in the last 168 hours. CBC:  Recent Labs Lab 12/29/14 1455 12/30/14 0524  WBC 13.4* 10.5  NEUTROABS 11.2*  --   HGB 14.3 12.9*  HCT 42.3 39.2  MCV 99.1 99.7  PLT 208 194   Cardiac Enzymes: No results for input(s): CKTOTAL, CKMB, CKMBINDEX, TROPONINI in the last 168 hours. BNP (last 3 results)  Recent Labs  01/29/14 2056 12/03/14 1253  BNP 50.1 66.8    ProBNP (last 3 results)  Recent Labs  08/11/14 1124  PROBNP 57.0    CBG:  Recent Labs Lab 12/29/14 1702  GLUCAP 132*    No results found for this or any previous visit (from the past 240 hour(s)).   Studies: Ct Abdomen Pelvis Wo Contrast  12/29/2014  CLINICAL DATA:  Rectal pain EXAM: CT ABDOMEN AND PELVIS WITHOUT CONTRAST TECHNIQUE: Multidetector CT imaging of the abdomen and pelvis was performed following the standard protocol without IV contrast. COMPARISON:  11/02/2003 FINDINGS: There is large stool burden in the rectum. There is wall thickening surrounding the rectum and slight stranding in the adjacent fat compatible with direct itis. The bladder is moderately distended. No ureteral calculus. No hydronephrosis. Simple cysts in the kidneys. Dependent atelectasis at the right lung base. Moderate-sized hiatal hernia. Large amount of food contents  is present in the stomach. Liver, spleen, pancreas, adrenal glands are within normal limits Gallbladder is decompressed Moderate stool burden throughout the course of the colon without abnormal distention or dilatation. Previously visualized inguinal hernia is no longer visualized and may have been corrected surgically. There is no free-fluid. No free intraperitoneal gas. No abnormal retroperitoneal adenopathy. Atherosclerotic changes of the aorta are noted. IMPRESSION: There is prominent stool burden in the rectum associated with inflammatory changes of the rectal wall. There is  moderate stool burden throughout the remainder of the colon. There is a large amount of fluid contents in the stomach. Hiatal hernia is noted. Electronically Signed   By: Marybelle Killings M.D.   On: 12/29/2014 15:43   Ct Head Wo Contrast  12/30/2014  CLINICAL DATA:  Dizziness, expressive a aphasia. History of hypertension, insomnia, [facility] spotted fever, and melanoma. Time of onset not indicated. EXAM: CT HEAD WITHOUT CONTRAST TECHNIQUE: Contiguous axial images were obtained from the base of the skull through the vertex without intravenous contrast. COMPARISON:  07/21/2014 and 12/10/2013 FINDINGS: Diffuse cerebral atrophy. Ventricular dilatation consistent with central atrophy. Cerebellar atrophy. No mass effect or midline shift. No abnormal extra-axial fluid collections. Gray-white matter junctions are distinct. Basal cisterns are not effaced. No evidence of acute intracranial hemorrhage. No depressed skull fractures. Opacification of some of the ethmoid air cells with mucosal thickening in the maxillary antra. Mastoid air cells are not opacified. Vascular calcification. IMPRESSION: No acute intracranial abnormalities.  Diffuse cerebral atrophy. Electronically Signed   By: Lucienne Capers M.D.   On: 12/30/2014 01:11    Scheduled Meds: . amiodarone  400 mg Oral Daily  . aspirin EC  81 mg Oral Daily  . [START ON 01/02/2015] cholecalciferol  5,000 Units Oral Q Mon  . clopidogrel  75 mg Oral Daily  . colchicine  0.6 mg Oral Daily  . enoxaparin (LOVENOX) injection  40 mg Subcutaneous Q24H  . famotidine  20 mg Oral QHS  . levothyroxine  112 mcg Oral QAC breakfast  . meloxicam  7.5 mg Oral Daily  . midodrine  2.5 mg Oral TID WC  . nystatin   Topical BID  . pantoprazole  40 mg Oral BID  . pravastatin  40 mg Oral q1800   Continuous Infusions:   Time spent: > 35 minutes    Velvet Bathe  [facility] Pager [phone]. If 7PM-7AM, please contact night-coverage at [email or web address], password  [facility] 12/31/2014, 2:35 PM

## 2015-01-01 DIAGNOSIS — L8990 Pressure ulcer of unspecified site, unspecified stage: Secondary | ICD-10-CM | POA: Insufficient documentation

## 2015-01-01 DIAGNOSIS — R339 Retention of urine, unspecified: Secondary | ICD-10-CM | POA: Diagnosis not present

## 2015-01-01 DIAGNOSIS — L8992 Pressure ulcer of unspecified site, stage 2: Secondary | ICD-10-CM | POA: Insufficient documentation

## 2015-01-01 NOTE — Clinical Social Work Note (Signed)
CSW confirmed with [facility] and pt's daughter that 12:30 is a good time for pt to be transported by family and paperwork will be signed at that time.    Dede Query, LCSW [place] Worker - Weekend Coverage cell #: [phone]

## 2015-01-01 NOTE — Progress Notes (Signed)
Pt left at this time with his son-in-law headed to [facility] SNF. Alert, oriented, and without c/o.

## 2015-01-01 NOTE — Progress Notes (Signed)
Writer gave report to receiving nurse at [facility]. Pt to be transferred by daughter, Caryl Asp; spouse; son-in-law.

## 2015-01-01 NOTE — Clinical Social Work Note (Signed)
CSW secured a bed at [facility] for pt today.  Pt's daughter is at bedside and was informed of discharge plans.  She is going to transport pt and help with intake paperwok.   CSW will work on Environmental health practitioner as pt discharge summary is completed  .Dede Query, LCSW [facility] Clinical Social Worker - Weekend Coverage cell #: [phone]

## 2015-01-01 NOTE — Discharge Summary (Signed)
Physician Discharge Summary  Adam [name] [id number] DOB: 14-Apr-1926 DOA: 12/29/2014  PCP: Horatio Pel, MD  Admit date: 12/29/2014 Discharge date: 01/01/2015  Time spent: > 35 minutes  Recommendations for Outpatient Follow-up:  1. Monitor blood sugars   Discharge Diagnoses:  Principal Problem:   Urinary retention Active Problems:   Cardiomyopathy, ischemic   Pacemaker   Hyperlipidemia   Hypothyroidism   GERD (gastroesophageal reflux disease)   Hypertension   Coronary artery disease with hx of myocardial infarct w/o hx of CABG   Fecal impaction (HCC)   Expressive aphasia   Leukocytosis   Pressure ulcer   Discharge Condition: Stable  Diet recommendation: Heart healthy  There were no vitals filed for this visit.  History of present illness:  From original history of present illness: 81 year old male with PMH of TURP, hypothyroidism, HLD, CAD s/p CABG, Afib, and SSS s/p [employer] PPM; who presents with a one-week history of constipation.  Patient required disimpaction in the emergency department.  Hospital Course:  Constipation - Resolved patient has had many bowel movements after disimpaction.  Urinary retention -Most likely secondary to constipation. Resolving this patient has had several voids per my discussion with nursing  For known comorbidities we'll continue home medication regimen please refer to list above and below  Procedures:  None  Consultations:  None  Discharge Exam: Filed Vitals:   12/31/14 2053 01/01/15 0503  BP: 110/69 123/70  Pulse: 70 71  Temp: 97.8 F (36.6 C) 97.7 F (36.5 C)  Resp: 19 18    General: Patient in no acute distress, alert and awake Cardiovascular: Regular rate and rhythm, no murmurs or rubs Respiratory: Clear to auscultation bilaterally, no wheezes  Discharge Instructions   Discharge Instructions    Call MD for:  difficulty breathing, headache or visual disturbances    Complete by:  As directed       Call MD for:  redness, tenderness, or signs of infection (pain, swelling, redness, odor or green/yellow discharge around incision site)    Complete by:  As directed      Call MD for:  temperature >100.4    Complete by:  As directed      Diet - low sodium heart healthy    Complete by:  As directed      Discharge instructions    Complete by:  As directed   Please be sure to follow up with your primary care physician at the facility     Increase activity slowly    Complete by:  As directed           Current Discharge Medication List    CONTINUE these medications which have NOT CHANGED   Details  amiodarone (PACERONE) 400 MG tablet Take 1 tablet (400 mg total) by mouth daily. Qty: 30 tablet, Refills: 0    AMITIZA 8 MCG capsule Take 8 mcg by mouth 2 (two) times daily. Refills: 6    aspirin EC 81 MG EC tablet Take 1 tablet (81 mg total) by mouth daily.    bisacodyl (DULCOLAX) 5 MG EC tablet Take 5 mg by mouth daily as needed for moderate constipation.    Cholecalciferol (VITAMIN D3) 5000 UNITS CAPS Take 1 capsule by mouth every Monday.     clobetasol ointment (TEMOVATE) [id number] % Apply 1 application topically 2 (two) times daily.    clopidogrel (PLAVIX) 75 MG tablet Take 75 mg by mouth daily.    colchicine 0.6 MG tablet Take 1 tablet (0.6 mg total) by  mouth daily. Qty: 30 tablet, Refills: 0    famotidine (PEPCID) 20 MG tablet Take 20 mg by mouth at bedtime.    ipratropium (ATROVENT) 0.06 % nasal spray Place 2 sprays into both nostrils 2 (two) times daily as needed for rhinitis.  Refills: 5    levothyroxine (SYNTHROID, LEVOTHROID) 112 MCG tablet Take 112 mcg by mouth daily before breakfast.    magnesium citrate SOLN Take 0.5 Bottles by mouth once.    meloxicam (MOBIC) 7.5 MG tablet Take 1 tablet (7.5 mg total) by mouth daily. Qty: 15 tablet, Refills: 0    midodrine (PROAMATINE) 2.5 MG tablet Take 1 tablet (2.5 mg total) by mouth 3 (three) times daily with meals. Take  third dose at least 6 hours before bedtime Qty: 90 tablet, Refills: 11    nystatin (MYCOSTATIN/NYSTOP) 100000 UNIT/GM POWD APPLY TWICE DAILY TO YOUR SCROTUM AND GENITAL AREA TO COVER THE AREA Refills: 0    pantoprazole (PROTONIX) 40 MG tablet TAKE 1 TABLET BY MOUTH TWICE A DAY Qty: 60 tablet, Refills: 11    polyethylene glycol (MIRALAX / GLYCOLAX) packet Take 17 g by mouth daily as needed (constipation).    pravastatin (PRAVACHOL) 40 MG tablet Take 40 mg by mouth daily. Refills: 3    rOPINIRole (REQUIP) 0.5 MG tablet Take 0.5-1 mg by mouth every evening. Refills: 2    torsemide (DEMADEX) 20 MG tablet Take 1 tablet (20 mg total) by mouth daily.    traZODone (DESYREL) 50 MG tablet Take 0.5 tablets (25 mg total) by mouth at bedtime as needed for sleep.    gabapentin (NEURONTIN) 100 MG capsule Take 1 pill nightly at bedtime for 1 week, then 2 pills nightly for 1 week, then 3 pills each night thereafter Qty: 90 capsule, Refills: 3      STOP taking these medications     ALPRAZolam (XANAX) 0.5 MG tablet        Allergies  Allergen Reactions  . Crestor [Rosuvastatin Calcium]     Unknown reaction   . Altace [Ramipril] Other (See Comments)    Cough  . Bactrim [Sulfamethoxazole-Trimethoprim] Rash  . Penicillins Rash    Has patient had a PCN reaction causing immediate rash, facial/tongue/throat swelling, SOB or lightheadedness with hypotension: No Has patient had a PCN reaction causing severe rash involving mucus membranes or skin necrosis: Yes Has patient had a PCN reaction that required hospitalization Yes Has patient had a PCN reaction occurring within the last 10 years: No If all of the above answers are "NO", then may proceed with Cephalosporin use.   . Sulfa Antibiotics Rash   Follow-up Information    Follow up with [facility].   Specialty:  Emergency Medicine   Why:  If symptoms worsen   Contact information:   [facility] [id number] [place] [phone]      Call Horatio Pel, MD.   Specialty:  Internal Medicine   Why:  follow-up as soon as you can   Contact information:   [address] [place] [address] [phone]        The results of significant diagnostics from this hospitalization (including imaging, microbiology, ancillary and laboratory) are listed below for reference.    Significant Diagnostic Studies: Ct Abdomen Pelvis Wo Contrast  12/29/2014  CLINICAL DATA:  Rectal pain EXAM: CT ABDOMEN AND PELVIS WITHOUT CONTRAST TECHNIQUE: Multidetector CT imaging of the abdomen and pelvis was performed following the standard protocol without IV contrast.  COMPARISON:  11/02/2003 FINDINGS: There is large stool burden in the rectum. There is wall thickening surrounding the rectum and slight stranding in the adjacent fat compatible with direct itis. The bladder is moderately distended. No ureteral calculus. No hydronephrosis. Simple cysts in the kidneys. Dependent atelectasis at the right lung base. Moderate-sized hiatal hernia. Large amount of food contents is present in the stomach. Liver, spleen, pancreas, adrenal glands are within normal limits Gallbladder is decompressed Moderate stool burden throughout the course of the colon without abnormal distention or dilatation. Previously visualized inguinal hernia is no longer visualized and may have been corrected surgically. There is no free-fluid. No free intraperitoneal gas. No abnormal retroperitoneal adenopathy. Atherosclerotic changes of the aorta are noted. IMPRESSION: There is prominent stool burden in the rectum associated with inflammatory changes of the rectal wall. There is moderate stool burden throughout the remainder of the colon. There is a large amount of fluid contents in the stomach. Hiatal hernia is noted. Electronically Signed   By: Marybelle Killings M.D.   On: 12/29/2014 15:43   Ct Head  Wo Contrast  12/30/2014  CLINICAL DATA:  Dizziness, expressive a aphasia. History of hypertension, insomnia, [facility] spotted fever, and melanoma. Time of onset not indicated. EXAM: CT HEAD WITHOUT CONTRAST TECHNIQUE: Contiguous axial images were obtained from the base of the skull through the vertex without intravenous contrast. COMPARISON:  07/21/2014 and 12/10/2013 FINDINGS: Diffuse cerebral atrophy. Ventricular dilatation consistent with central atrophy. Cerebellar atrophy. No mass effect or midline shift. No abnormal extra-axial fluid collections. Gray-white matter junctions are distinct. Basal cisterns are not effaced. No evidence of acute intracranial hemorrhage. No depressed skull fractures. Opacification of some of the ethmoid air cells with mucosal thickening in the maxillary antra. Mastoid air cells are not opacified. Vascular calcification. IMPRESSION: No acute intracranial abnormalities.  Diffuse cerebral atrophy. Electronically Signed   By: Lucienne Capers M.D.   On: 12/30/2014 01:11   Dg Chest Portable 1 View  12/03/2014  CLINICAL DATA:  Dizzy and lightheaded. EXAM: PORTABLE CHEST 1 VIEW COMPARISON:  08/11/2014, chest radiograph. FINDINGS: Dual lead cardiac pacemaker is stable. Postsurgical changes from CABG are also noted. Cardiomediastinal silhouette is stably enlarged. Mediastinal contours appear intact. Atherosclerotic calcifications of the aorta are noted. There is a moderate in size hiatal hernia. There is no evidence of focal airspace consolidation, pleural effusion or pneumothorax. Osseous structures are without acute abnormality. Soft tissues are grossly normal. IMPRESSION: No evidence of acute cardiopulmonary process. Stable enlargement of the cardiac silhouette. Moderate in size hiatal hernia. Electronically Signed   By: Fidela Salisbury M.D.   On: 12/03/2014 13:33   Dg Knee Complete 4 Views Left  12/03/2014  CLINICAL DATA:  Pain for 1 day.  No history of trauma EXAM:  LEFT KNEE - COMPLETE 4+ VIEW COMPARISON:  None. FINDINGS: Frontal, bilateral oblique, and lateral views obtained. There is no demonstrable fracture or dislocation. There is a sizable joint effusion. There is severe narrowing of the patellofemoral joint. There is moderate narrowing medially. There is extensive chondrocalcinosis. No erosive change. There are scattered foci of arterial vascular calcification. IMPRESSION: Sizable joint effusion. Marked narrowing of the patellofemoral joint. Extensive chondrocalcinosis. Moderate narrowing medially. These changes may be seen with osteoarthritis, a degree of osteoarthritis is present. However, this combination of findings is also suggestive of calcium pyrophosphate deposition disease which may present clinically as pseudogout. No fracture or dislocation apparent. Electronically Signed   By: Lowella Grip III M.D.   On: 12/03/2014 18:24  Dg Abd 2 Views  12/28/2014  CLINICAL DATA:  Constipation EXAM: ABDOMEN - 2 VIEW COMPARISON:  December 06, 2014 FINDINGS: Supine and upright images obtained. There is diffuse stool throughout the colon. There is no bowel dilatation or air-fluid level suggesting obstruction. No free air. There is a hiatal hernia. Lung bases appear clear. IMPRESSION: Diffuse stool throughout the colon. No obstruction or free air. Hiatal hernia present. Electronically Signed   By: Lowella Grip III M.D.   On: 12/28/2014 16:48   Dg Abd Acute W/chest  12/06/2014  CLINICAL DATA:  Abdominal pain.  Constipation. EXAM: DG ABDOMEN ACUTE W/ 1V CHEST COMPARISON:  Radiographs dated 12/03/2014 and 09/08/2014 FINDINGS: Heart and lungs are clear. Large hiatal hernia. No free air in the abdomen. Air scattered throughout nondistended loops of large and small bowel. Moderate stool in the rectum. There is suggestion of compression fractures of two the lumbar vertebra but I suspect these are old. IMPRESSION: Benign appearing abdomen and chest. Probable old  compression fractures in the lumbar spine. Electronically Signed   By: [name] M.D.   On: 12/06/2014 13:32    Microbiology: No results found for this or any previous visit (from the past 240 hour(s)).   Labs: Basic Metabolic Panel:  Recent Labs Lab 12/29/14 1455 12/30/14 0524  NA 137 138  K 4.3 4.2  CL 104 105  CO2 27 27  GLUCOSE 123* 132*  BUN 20 15  CREATININE 0.98 0.83  CALCIUM 9.1 8.7*   Liver Function Tests:  Recent Labs Lab 12/29/14 1455  AST 27  ALT 29  ALKPHOS 65  BILITOT 0.6  PROT 7.0  ALBUMIN 3.9    Recent Labs Lab 12/29/14 1455  LIPASE 28   No results for input(s): AMMONIA in the last 168 hours. CBC:  Recent Labs Lab 12/29/14 1455 12/30/14 0524  WBC 13.4* 10.5  NEUTROABS 11.2*  --   HGB 14.3 12.9*  HCT 42.3 39.2  MCV 99.1 99.7  PLT 208 194   Cardiac Enzymes: No results for input(s): CKTOTAL, CKMB, CKMBINDEX, TROPONINI in the last 168 hours. BNP: BNP (last 3 results)  Recent Labs  01/29/14 2056 12/03/14 1253  BNP 50.1 66.8    ProBNP (last 3 results)  Recent Labs  08/11/14 1124  PROBNP 57.0    CBG:  Recent Labs Lab 12/29/14 1702  GLUCAP 132*   Signed:  Velvet Bathe  [facility] 01/01/2015, 11:01 AM

## 2015-01-01 NOTE — Clinical Social Work Placement (Signed)
   CLINICAL SOCIAL WORK PLACEMENT  NOTE  Date:  01/01/2015  Patient Details  Name: Adam [name] MRN: [id number] Date of Birth: 01-04-1927  Clinical Social Work is seeking post-discharge placement for this patient at the [place] level of care (*CSW will initial, date and re-position this form in  chart as items are completed):  Yes   Patient/family provided with [facility] Work Department's list of facilities offering this level of care within the geographic area requested by the patient (or if unable, by the patient's family).  Yes   Patient/family informed of their freedom to choose among providers that offer the needed level of care, that participate in Medicare, Medicaid or managed care program needed by the patient, have an available bed and are willing to accept the patient.  Yes   Patient/family informed of [facility]'s ownership interest in [facility] and [facility], as well as of the fact that they are under no obligation to receive care at these facilities.  PASRR submitted to EDS on 12/31/14     PASRR number received on 12/31/14     Existing PASRR number confirmed on       FL2 transmitted to all facilities in geographic area requested by pt/family on       FL2 transmitted to all facilities within larger geographic area on 12/31/14     Patient informed that his/her managed care company has contracts with or will negotiate with certain facilities, including the following:            Patient/family informed of bed offers received.  Patient chooses bed at   [facility] recommends and patient chooses bed at      Patient to be transferred to  [facility] on   January 01, 2015.  Patient to be transferred to facility by  daughter/[name]     Patient family notified on   January 01, 2015 of transfer.  Name of family member notified:     [name]/daughter   PHYSICIAN       Additional Comment:     _______________________________________________ Carlean Jews, LCSW 01/01/2015, 11:48 AM

## 2015-01-01 NOTE — Clinical Social Work Note (Addendum)
Clinical Social Work Assessment  Patient Details  Name: Adam [name] MRN: [id number] Date of Birth: December 04, 1926  Date of referral:                  Reason for consult:                   Permission sought to share information with:    Permission granted to share information::     Name::        Agency::     Relationship::     Contact Information:     Housing/Transportation Living arrangements for the past 2 months:    Source of Information:    Patient Interpreter Needed:    Criminal Activity/Legal Involvement Pertinent to Current Situation/Hospitalization:    Significant Relationships:    Lives with:    Do you feel safe going back to the place where you live?    Need for family participation in patient care:     Care giving concerns:  No concerns   Facilities manager / plan:  CSW met with pt, his wife, daughter and son in law at bedside to discuss discharge needs.  PT is recommending SNF now, prior notes reflected HHPT so no information had been obtained or sent out to facilities.  CSW will send pt information out to SNF's in [place] and obtain pasaar number.  CSW will also send pt information to [facility] as pt and family stated this may be an option due to pt and spouse will be moving into [facility] in future.   Employment status:    Insurance information:    PT Recommendations:    Information / Referral to community resources:     Patient/Family's Response to care:  Pt has no history of rehab but open to discussing with CSW and family present.  Pt's family discussed wanting [facility] and CSW explained that pt would have to be resident there to be able to go to their rehab.  Pt stated that he is on the board and that he and his spouse are planning on moving to [facility] so he would call them and see possibility for rehab.  First choice if [facility] is not an option is [facility].   Patient/Family's Understanding of and Emotional Response to Diagnosis, Current  Treatment, and Prognosis:  Pt and spouse required some explanation of rehab and needs for pt.  Pt's daughter strongly suggests pt needs rehab and cannot go home.  Pt's wife cannot care for pt at home per daughter and son in law.  Pt and his spouse agreeable to SNF  Emotional Assessment Appearance:    Attitude/Demeanor/Rapport:    Affect (typically observed):    Orientation:    Alcohol / Substance use:    Psych involvement (Current and /or in the community):     Discharge Needs  Concerns to be addressed:    Readmission within the last 30 days:    Current discharge risk:    Barriers to Discharge:      Caswell Corwin 01/01/2015, 8:56 AM

## 2015-01-05 ENCOUNTER — Encounter: Payer: Self-pay | Admitting: Cardiology

## 2015-01-06 ENCOUNTER — Ambulatory Visit: Payer: Medicare Other | Admitting: Cardiovascular Disease

## 2015-01-11 ENCOUNTER — Ambulatory Visit (INDEPENDENT_AMBULATORY_CARE_PROVIDER_SITE_OTHER): Payer: Medicare Other | Admitting: Cardiovascular Disease

## 2015-01-11 ENCOUNTER — Encounter: Payer: Self-pay | Admitting: Cardiovascular Disease

## 2015-01-11 VITALS — BP 116/62 | HR 68 | Ht 71.0 in | Wt 179.5 lb

## 2015-01-11 DIAGNOSIS — I495 Sick sinus syndrome: Secondary | ICD-10-CM | POA: Diagnosis not present

## 2015-01-11 DIAGNOSIS — I255 Ischemic cardiomyopathy: Secondary | ICD-10-CM | POA: Diagnosis not present

## 2015-01-11 DIAGNOSIS — I472 Ventricular tachycardia, unspecified: Secondary | ICD-10-CM

## 2015-01-11 DIAGNOSIS — I2510 Atherosclerotic heart disease of native coronary artery without angina pectoris: Secondary | ICD-10-CM

## 2015-01-11 DIAGNOSIS — I10 Essential (primary) hypertension: Secondary | ICD-10-CM

## 2015-01-11 DIAGNOSIS — I951 Orthostatic hypotension: Secondary | ICD-10-CM

## 2015-01-11 NOTE — Patient Instructions (Signed)
Dr. Sallyanne Kuster recommends that you schedule a follow-up appointment in: 4-5 months

## 2015-01-11 NOTE — Progress Notes (Signed)
Patient ID: JAGAR WALLES, male   DOB: Sep 01, 1926, 81 y.o.   MRN: [id number]     Cardiology Office Note   Date:  01/12/2015   ID:  Adam [name], DOB May 16, 1926, MRN [id number]  PCP:  Adam Pel, MD  Cardiologist:   [name], MD   Chief Complaint  Patient presents with  . Follow-up    no chest pain, occasional shortness of breath, constant edema, no pain or cramping in legs, frequently lightheaded or dizziness       History of Present Illness: Adam [name] is a 81 y.o. male who presents for  Follow-up for severe symptomatic orthostatic hypotension , coronary artery disease with ischemic cardiomyopathy and history of sustained ventricular tachycardia, sinus node dysfunction with tachycardia-bradycardia syndrome, paroxysmal atrial fibrillation, dual-chamber permanent pacemaker.  Whereas in the past Mr. Adam [name] required multiple medications for control of his hypertension, over the last few years these medications have been gradually removed and he is now suffering from profound orthostatic hypotension. Drops in systolic blood pressure of 50 mmHg have been repeatedly documented including recently.  Thankfully, he has not had any serious falls or syncope. He was started on midodrine at his last office appointment, with some improvement.  Sodium intake was liberalized and his dose of diuretic was decreased. His dizzy spells have become less severe , but he has developed moderate lower extremity edema. He has not had any oozing from the skin, skin blisters, but is now wearing slippers rather than dress shoes, because of pedal edema. He has been wearing compression stockings, but does not have any today following a bathroom accident.  His weight is almost identical to his last appointment in November  He has mild to moderate ischemic cardiopathy with left ventricular ejection fraction of about 45%. Heart failure symptoms have not been a problem recently. He takes relatively high dose  amiodarone for recurrent sustained ventricular tachycardia , without syncope.  He has CAD and is free of angina pectoris. He underwent bypass surgery 1996. He had placement of a stent to the SVG to RCA in 2011. His last cardiac catheterization in 2012 show that vessel to be widely patent but all his native coronary arteries are occluded and he is graft dependent. He is nuclear stress test in December 2015 showed an extensive inferior wall scar without any areas of ischemia.    in June 2016 he presented with rapid palpitations associated with near-syncope, dyspnea and extreme fatigue , but did not lose consciousness. Interrogation of his pacemaker showed relatively slow ventricular tachycardia at around 150 bpm that appear to have a regular, monomorphic electrogram.  His dose of amiodarone was increased and he has not had ventricular tachycardia since. He was evaluated by Dr. Caryl Comes. After some debate, we decided that a conservative approach is most appropriate in view of his declining overall functional status and advanced age.   His dual-chamber permanent pacemaker (2010 [employer] DR) is functioning normally, last checked by remote download November 1.  Estimated generator longevity is 3.1 years. There is 99% atrial pacing and 87% ventricular pacing, both increased after the amiodarone dose was increased.  There have been no new episodes of ventricular tachycardia or atrial arrhythmia since the previous check.  Unfortunately has also been developing progressive dementia, primarily manifested as short-term memory loss. He is now a resident at [facility].  Past Medical History  Diagnosis Date  . CAD (coronary artery disease)     CABG (LIMA-LAD, SVG-RCA, SVG-OM in 1996).  07/2009 BMS to SVG-RCA. Cath in 04/2010 with patent stents   . Bradycardia     AFib/SSS s/p St Jude PPM 04/12/2008  . Hypothyroid   . Pacemaker   . Hyperlipidemia   . GERD (gastroesophageal reflux disease)   . Hiatal hernia     . Insomnia   . S/P CABG x 4   . RBBB   . Melanoma of back ([place]) 1976  . Heart murmur     "just told I had one today" (12/15/2012)  . Myocardial infarction [facility]) 1996; 2011    "both silent" (12/15/2012)  . Exertional shortness of breath     "sometimes walking" (12/15/2012)  . Arthritis     "minor, back and sometimes knees" (12/15/2012)  . Sustained ventricular tachycardia ([place]) 07/27/2014  . Hypertension   . [place] spotted fever     Past Surgical History  Procedure Laterality Date  . US echocardiography  07/11/2009    EF 45-50%  . Nm myoview ltd  06/2011    low risk  . Coronary artery bypass graft  1996    LIMA to LAD,SVG to RCA & SVG to OM  . Tonsillectomy  1938  . Appendectomy  1942  . Coronary angioplasty with stent placement  07/2009    bare metal stent to SVG to the RCA  . Cardiac catheterization  04/2010    LIMA to LAD patent,SVG to OM patent,no in-stnet restenosis RCA  . Cataract extraction w/ intraocular lens  implant, bilateral Bilateral 2012  . Melanoma excision  05/1974 X2    "taken off my back" (12/15/2012)  . Transurethral resection of prostate  1986  . Insert / replace / remove pacemaker  2010     Current Outpatient Prescriptions  Medication Sig Dispense Refill  . ALPRAZolam (XANAX) 0.25 MG tablet Take 0.25 mg by mouth 2 (two) times daily. Take 1 tab bid  0  . amiodarone (PACERONE) 400 MG tablet Take 1 tablet (400 mg total) by mouth daily. 30 tablet 0  . AMITIZA 8 MCG capsule Take 8 mcg by mouth 2 (two) times daily.  6  . aspirin EC 81 MG EC tablet Take 1 tablet (81 mg total) by mouth daily.    . Cholecalciferol (VITAMIN D3) 5000 UNITS CAPS Take 1 capsule by mouth every Monday.     . clobetasol ointment (TEMOVATE) [id number] % Apply 1 application topically 2 (two) times daily.    . clopidogrel (PLAVIX) 75 MG tablet Take 75 mg by mouth daily.    . colchicine 0.6 MG tablet Take 1 tablet (0.6 mg total) by mouth daily. 30 tablet 0  . [employer] GENTLE LAXATIVE 10 MG  suppository Use as directed  6  . famotidine (PEPCID) 20 MG tablet Take 20 mg by mouth at bedtime.    Marland Kitchen ipratropium (ATROVENT) 0.06 % nasal spray Place 2 sprays into both nostrils 2 (two) times daily as needed for rhinitis.   5  . levothyroxine (SYNTHROID, LEVOTHROID) 112 MCG tablet Take 112 mcg by mouth daily before breakfast.    . meloxicam (MOBIC) 7.5 MG tablet Take 1 tablet (7.5 mg total) by mouth daily. 15 tablet 0  . midodrine (PROAMATINE) 2.5 MG tablet Take 1 tablet (2.5 mg total) by mouth 3 (three) times daily with meals. Take third dose at least 6 hours before bedtime 90 tablet 11  . nystatin (MYCOSTATIN/NYSTOP) 100000 UNIT/GM POWD APPLY TWICE DAILY TO YOUR SCROTUM AND GENITAL AREA TO COVER THE AREA  0  . pantoprazole (PROTONIX) 40  MG tablet TAKE 1 TABLET BY MOUTH TWICE A DAY 60 tablet 11  . pravastatin (PRAVACHOL) 40 MG tablet Take 40 mg by mouth daily.  3  . rOPINIRole (REQUIP) 0.5 MG tablet Take 0.5-1 mg by mouth every evening.  2  . senna (SENOKOT) 8.6 MG tablet Take 1 tablet by mouth daily.    Marland Kitchen torsemide (DEMADEX) 20 MG tablet Take 1 tablet (20 mg total) by mouth daily. (Patient taking differently: Take 10 mg by mouth daily as needed. Take if dry weight is above 175lbs or above)    . traZODone (DESYREL) 50 MG tablet Take 0.5 tablets (25 mg total) by mouth at bedtime as needed for sleep.     No current facility-administered medications for this visit.    Allergies:   Crestor; Altace; Bactrim; Penicillins; and Sulfa antibiotics    Social History:  The patient  reports that he has never smoked. He has never used smokeless tobacco. He reports that he does not drink alcohol or use illicit drugs.   Family History:  The patient's family history includes Coronary artery disease in his father and mother; Diabetes in his father and mother; Heart disease in his father and mother; Lung cancer in his father.    ROS:  Please see the history of present illness.    Otherwise, review of  systems positive for  Constipation, recent fecal impaction leading to an emergency room visit.   All other systems are reviewed and negative.    PHYSICAL EXAM: VS:  BP 116/62 mmHg  Pulse 68  Ht 5\' 11"  (1.803 m)  Wt 179 lb 8 oz (81.421 kg)  BMI 25.05 kg/m2 , BMI Body mass index is 25.05 kg/(m^2).  General: Alert, oriented x3, no distress Head: no evidence of trauma, PERRL, EOMI, no exophtalmos or lid lag, no myxedema, no xanthelasma; normal ears, nose and oropharynx Neck: normal jugular venous pulsations and no hepatojugular reflux; brisk carotid pulses without delay and no carotid bruits Chest: clear to auscultation, no signs of consolidation by percussion or palpation, normal fremitus, symmetrical and full respiratory excursions,  Healthy pacemaker site Cardiovascular: normal position and quality of the apical impulse, regular rhythm, normal first and second heart sounds, no murmurs, rubs or gallops Abdomen: no tenderness or distention, no masses by palpation, no abnormal pulsatility or arterial bruits, normal bowel sounds, no hepatosplenomegaly Extremities: no clubbing, cyanosis or edema; 2+ radial, ulnar and brachial pulses bilaterally; 2+ right femoral, posterior tibial and dorsalis pedis pulses; 2+ left femoral, posterior tibial and dorsalis pedis pulses; no subclavian or femoral bruits Neurological: grossly nonfocal Psych: euthymic mood, full affect   EKG:  EKG is not ordered today.   Recent Labs: 07/26/2014: Magnesium 2.4 08/11/2014: Pro B Natriuretic peptide (BNP) 57.0 12/03/2014: B Natriuretic Peptide 66.8 12/29/2014: ALT 29; TSH 3.250 12/30/2014: BUN 15; Creatinine, Ser 0.83; Hemoglobin 12.9*; Platelets 194; Potassium 4.2; Sodium 138    Lipid Panel    Component Value Date/Time   CHOL  09/19/2009 0354    93        ATP III CLASSIFICATION:  <200     mg/dL   Desirable  200-239  mg/dL   Borderline High  >=240    mg/dL   High          TRIG 98 09/19/2009 0354   HDL 34*  09/19/2009 0354   CHOLHDL 2.7 09/19/2009 0354   VLDL 20 09/19/2009 0354   LDLCALC  09/19/2009 0354    39  Total Cholesterol/HDL:CHD Risk Coronary Heart Disease Risk Table                     Men   Women  1/2 Average Risk   3.4   3.3  Average Risk       5.0   4.4  2 X Average Risk   9.6   7.1  3 X Average Risk  23.4   11.0        Use the calculated Patient Ratio above and the CHD Risk Table to determine the patient's CHD Risk.        ATP III CLASSIFICATION (LDL):  <100     mg/dL   Optimal  100-129  mg/dL   Near or Above                    Optimal  130-159  mg/dL   Borderline  160-189  mg/dL   High  >190     mg/dL   Very High      Wt Readings from Last 3 Encounters:  01/11/15 179 lb 8 oz (81.421 kg)  12/12/14 179 lb (81.194 kg)  12/05/14 173 lb 3.2 oz (78.563 kg)        ASSESSMENT AND PLAN:  1. Sustained symptomatic ventricular tachycardia - This was relatively slow at 150 bpm , monomorphic and most likely represents inferior wall scar VT. Since amiodarone initiation, no further arrhythmia.  Again, discussed potential side effects of amiodarone, the need for periodic liver function tests, thyroid function tests and ophthalmological exam , protection from sun exposure and possibility of lung injury that can be very serious. He had pulmonary function tests performed in June 2015 that we can use as baseline for future assessment.  if there is no evidence of ventricular tachycardia at his next appointment, we'll reduce the dose of amiodarone to 200 mg daily.  2.CAD (coronary artery disease) He has occluded native arteries and is graft dependent (LIMA to LAD, SVG to RCA, SVG to OM; status post stent in SVG to RCA 2011, patent by cath March 2012). November 2014 nuclear study shows inferior scar without ischemia, EF 41%. It is possible that there has been interval occlusion of his saphenous vein graft to the right coronary artery in the last 4 years. In the absence of  angina worsening ventricular arrhythmia, plan conservative management  3. Acute on chronic combined systolic and diastolic heart failure NYHA functional class II status. He does not tolerate ACE inhibitors due to cough. Angiotensin receptor blockers are considered but felt to be contraindicated because of his tendency to symptomatic hypotension. Beta blockers have been gradually curtailed due to symptomatic orthostatic hypotension.  He is now clearly hypervolemic, but this has been associated with a reduction in the number of spells of severe dizziness and hypotension.  4. Pacemaker Normal device function. High-frequency ventricular pacing persists and we have been unable to mitigate this with any type of reprogramming strategy. Biventricular pacing does not appear justified at this point in this 81 year old gentleman with class II symptoms.  5. Severe orthostatic hypotension  We'll have to allow him to maintain some degree of hypervolemia as long as he does not have dyspnea and his leg edema does not cause skin ulceration, weeping or blisters which would make him prone to cellulitis. Compression stockings will help in preventing excessive edema while also preventing excessive drops in blood pressure. Whenever he is sitting down his leg should be elevated. Whenever he is standing  up he should be walking. Continue daily weight monitoring. The cause for his orthostatic hypotension is not clear and he may have autonomic dysfunction. It proceeds amiodarone therapy.  6.  Early dementia  It may be that his hypotension and neurological problems gradually take center stage from his cardiac condition.    Current medicines are reviewed at length with the patient today.  The patient does not have concerns regarding medicines.  The following changes have been made:  no change  Labs/ tests ordered today include:  No orders of the defined types were placed in this encounter.     Patient Instructions  Dr.  Sallyanne Kuster recommends that you schedule a follow-up appointment in: 4-5 months    pacemaker download scheduled in February. Mikael Spray, MD  01/12/2015 5:16 PM    [name], MD, [facility] [phone] office [phone] pager

## 2015-01-17 ENCOUNTER — Encounter: Payer: Self-pay | Admitting: *Deleted

## 2015-01-28 ENCOUNTER — Telehealth: Payer: Self-pay | Admitting: Physician Assistant

## 2015-01-28 NOTE — Telephone Encounter (Signed)
Patient has been having some red spot below the knee, no itchiness or pain. Has been on amio for a long time, last dose increase June.   Also has increased RLE edema. Patient on torsemide. H/o significant orthostatic hypotension, on midodrine.  Asked daughter to continue monitor red spots. I have not sure what's causing. Not sure if it is related to [facility], but he has been on amio for a long time without problem. Advised that if swelling become worse, then he can take additional dose of torsemide.   Hilbert Corrigan PA Pager: [phone]

## 2015-02-02 ENCOUNTER — Encounter: Payer: Self-pay | Admitting: Nurse Practitioner

## 2015-02-02 ENCOUNTER — Non-Acute Institutional Stay: Payer: Medicare Other | Admitting: Nurse Practitioner

## 2015-02-02 VITALS — BP 118/64 | HR 71 | Temp 97.0°F | Ht 71.0 in | Wt 185.8 lb

## 2015-02-02 DIAGNOSIS — K219 Gastro-esophageal reflux disease without esophagitis: Secondary | ICD-10-CM | POA: Diagnosis not present

## 2015-02-02 DIAGNOSIS — I5040 Unspecified combined systolic (congestive) and diastolic (congestive) heart failure: Secondary | ICD-10-CM

## 2015-02-02 DIAGNOSIS — R350 Frequency of micturition: Secondary | ICD-10-CM | POA: Diagnosis not present

## 2015-02-02 DIAGNOSIS — I951 Orthostatic hypotension: Secondary | ICD-10-CM | POA: Diagnosis not present

## 2015-02-02 DIAGNOSIS — K5900 Constipation, unspecified: Secondary | ICD-10-CM

## 2015-02-02 DIAGNOSIS — F418 Other specified anxiety disorders: Secondary | ICD-10-CM | POA: Diagnosis not present

## 2015-02-02 DIAGNOSIS — I2510 Atherosclerotic heart disease of native coronary artery without angina pectoris: Secondary | ICD-10-CM | POA: Diagnosis not present

## 2015-02-02 DIAGNOSIS — E876 Hypokalemia: Secondary | ICD-10-CM | POA: Diagnosis not present

## 2015-02-02 DIAGNOSIS — I2583 Coronary atherosclerosis due to lipid rich plaque: Secondary | ICD-10-CM

## 2015-02-02 DIAGNOSIS — R05 Cough: Secondary | ICD-10-CM

## 2015-02-02 DIAGNOSIS — I472 Ventricular tachycardia, unspecified: Secondary | ICD-10-CM

## 2015-02-02 DIAGNOSIS — R058 Other specified cough: Secondary | ICD-10-CM

## 2015-02-02 DIAGNOSIS — L8990 Pressure ulcer of unspecified site, unspecified stage: Secondary | ICD-10-CM

## 2015-02-02 DIAGNOSIS — F339 Major depressive disorder, recurrent, unspecified: Secondary | ICD-10-CM | POA: Insufficient documentation

## 2015-02-02 DIAGNOSIS — I10 Essential (primary) hypertension: Secondary | ICD-10-CM | POA: Diagnosis not present

## 2015-02-02 DIAGNOSIS — E039 Hypothyroidism, unspecified: Secondary | ICD-10-CM

## 2015-02-02 DIAGNOSIS — G2581 Restless legs syndrome: Secondary | ICD-10-CM | POA: Diagnosis not present

## 2015-02-02 HISTORY — DX: Restless legs syndrome: G25.81

## 2015-02-02 HISTORY — DX: Unspecified combined systolic (congestive) and diastolic (congestive) heart failure: I50.40

## 2015-02-02 HISTORY — DX: Other specified anxiety disorders: F41.8

## 2015-02-02 NOTE — Assessment & Plan Note (Signed)
Compensated clinically, continue Torsemide, update BNP

## 2015-02-02 NOTE — Assessment & Plan Note (Addendum)
Underwent pulmonology evaluation, continue Protonix, Famotidine, Atrovent nasal spray. Update CBC

## 2015-02-02 NOTE — Assessment & Plan Note (Signed)
Managed with Requip 0.5mg  daily. Observe.

## 2015-02-02 NOTE — Progress Notes (Signed)
Patient ID: Adam [name], male   DOB: May 24, 1926, 81 y.o.   MRN: [id number]  Location:  clinic [place] Provider:  Marlana Latus NP  Code Status:  DNR Goals of care: Advanced Directive information Does patient have an advance directive?: Yes, Type of Advance Directive: Healthcare Power of Attorney  Chief Complaint  Patient presents with  . OTHER    NP to Establish     HPI: Patient is a 81 y.o. male seen in the clinic at [facility] today for evaluation of new patient establishment, last TSH 7 10/2014  Review of Systems  Constitutional: Negative for weight loss and malaise/fatigue.  HENT: Positive for hearing loss and tinnitus.   Eyes: Negative for double vision, photophobia and pain.  Respiratory: Negative for cough, hemoptysis, sputum production and shortness of breath.   Cardiovascular: Positive for leg swelling. Negative for chest pain and claudication.       1+. Chronic, compression hosiery.   Gastrointestinal: Negative for nausea, vomiting, abdominal pain, diarrhea and constipation.  Genitourinary: Positive for frequency. Negative for hematuria.  Musculoskeletal: Negative for myalgias, back pain, joint pain and neck pain.       Ambulates with walker.   Skin: Negative for itching and rash.       Coccyx pressure ulcer healing nicely  Neurological: Positive for dizziness, sensory change and weakness. Negative for tingling and tremors.       Insomnia, restless legs.   Patient reports that he has H/O dizziness but has been worse in past 4 months. This makes the patient feel unbalanced.   R hand numbness.   Psychiatric/Behavioral: Negative for depression, suicidal ideas, hallucinations and substance abuse. The patient has insomnia. The patient is not nervous/anxious.        Not enough sleep, decreased energy     Past Medical History  Diagnosis Date  . CAD (coronary artery disease)     CABG (LIMA-LAD, SVG-RCA, SVG-OM in 1996).  07/2009 BMS to SVG-RCA. Cath in 04/2010 with  patent stents   . Bradycardia     AFib/SSS s/p St Jude PPM 04/12/2008  . Hypothyroid   . Pacemaker   . Hyperlipidemia   . GERD (gastroesophageal reflux disease)   . Hiatal hernia   . Insomnia   . S/P CABG x 4   . RBBB   . Melanoma of back ([place]) 1976  . Heart murmur     "just told I had one today" (12/15/2012)  . Myocardial infarction [facility]) 1996; 2011    "both silent" (12/15/2012)  . Exertional shortness of breath     "sometimes walking" (12/15/2012)  . Arthritis     "minor, back and sometimes knees" (12/15/2012)  . Sustained ventricular tachycardia ([place]) 07/27/2014  . Hypertension   . [facility] spotted fever   . CHF (congestive heart failure) ([place])   . Osteoporosis, senile     Patient Active Problem List   Diagnosis Date Noted  . Depression with anxiety 02/02/2015  . Restless leg 02/02/2015  . Combined congestive systolic and diastolic heart failure ([place]) 02/02/2015  . Urinary frequency 02/02/2015  . Pressure ulcer 01/01/2015  . Urinary retention 12/30/2014  . Coronary artery disease with hx of myocardial infarct w/o hx of CABG 12/30/2014  . Fecal impaction ([place]) 12/30/2014  . Expressive aphasia 12/30/2014  . Leukocytosis 12/30/2014  . Constipation   . Fecal impaction in rectum ([place])   . Arterial hypotension   . SOB (shortness of breath) 12/03/2014  . Dehydration 12/03/2014  . Knee  pain, acute 12/03/2014  . Shortness of breath 12/03/2014  . GERD (gastroesophageal reflux disease)   . Hypertension   . Dyspnea 08/11/2014  . Hypothyroidism 08/11/2014  . Sustained ventricular tachycardia ([place]) 07/27/2014  . Upper airway cough syndrome 07/14/2014  . Hypokalemia 01/31/2014  . Sick sinus syndrome ([place]) 01/31/2014  . Hyperlipidemia 01/30/2014  . Chest pain 01/06/2013  . Arm pain, possible anginal equivialnt  12/16/2012  . Cardiomyopathy, ischemic 08/25/2012  . Pacemaker 08/25/2012  . Hypotension, resolved with fluid bolus 06/07/2011  . Chest tightness, negative  MI, Negative Lexiscan myoview 06/05/2011  . Nausea 06/05/2011  . Dizziness, after diuretic asscoiated with hypotension and responded to fluid bolus 06/05/2011  . CAD (coronary artery disease)   . Bradycardia     Allergies  Allergen Reactions  . Crestor [Rosuvastatin Calcium]     Unknown reaction   . Altace [Ramipril] Other (See Comments)    Cough  . Bactrim [Sulfamethoxazole-Trimethoprim] Rash  . Penicillins Rash    Has patient had a PCN reaction causing immediate rash, facial/tongue/throat swelling, SOB or lightheadedness with hypotension: No Has patient had a PCN reaction causing severe rash involving mucus membranes or skin necrosis: Yes Has patient had a PCN reaction that required hospitalization Yes Has patient had a PCN reaction occurring within the last 10 years: No If all of the above answers are "NO", then may proceed with Cephalosporin use.   . Sulfa Antibiotics Rash    Medications: Patient's Medications  New Prescriptions   No medications on file  Previous Medications   ALPRAZOLAM (XANAX) 0.25 MG TABLET    Take 0.25 mg by mouth 2 (two) times daily. Take 1 tab bid   AMIODARONE (PACERONE) 400 MG TABLET    Take 1 tablet (400 mg total) by mouth daily.   AMITIZA 8 MCG CAPSULE    Take 8 mcg by mouth 2 (two) times daily.   ASPIRIN EC 81 MG EC TABLET    Take 1 tablet (81 mg total) by mouth daily.   CHOLECALCIFEROL (VITAMIN D3) 5000 UNITS CAPS    Take 1 capsule by mouth every Monday.    CLOBETASOL OINTMENT (TEMOVATE) 0.05 %    Apply 1 application topically 2 (two) times daily.   CLOPIDOGREL (PLAVIX) 75 MG TABLET    Take 75 mg by mouth daily.   COLCHICINE 0.6 MG TABLET    Take 1 tablet (0.6 mg total) by mouth daily.   [employer] GENTLE LAXATIVE 10 MG SUPPOSITORY    Use as directed   FAMOTIDINE (PEPCID) 20 MG TABLET    Take 20 mg by mouth at bedtime.   IPRATROPIUM (ATROVENT) 0.06 % NASAL SPRAY    Place 2 sprays into both nostrils 2 (two) times daily as needed for rhinitis.     LEVOTHYROXINE (SYNTHROID, LEVOTHROID) 112 MCG TABLET    Take 112 mcg by mouth daily before breakfast.   MELOXICAM (MOBIC) 7.5 MG TABLET    Take 1 tablet (7.5 mg total) by mouth daily.   MIDODRINE (PROAMATINE) 2.5 MG TABLET    Take 1 tablet (2.5 mg total) by mouth 3 (three) times daily with meals. Take third dose at least 6 hours before bedtime   PANTOPRAZOLE (PROTONIX) 40 MG TABLET    TAKE 1 TABLET BY MOUTH TWICE A DAY   PRAVASTATIN (PRAVACHOL) 40 MG TABLET    Take 40 mg by mouth daily.   ROPINIROLE (REQUIP) 0.5 MG TABLET    Take 0.5-1 mg by mouth every evening.   SENNA (SENOKOT) 8.6  MG TABLET    Take one tablet by mouth every other day at bedtime for constipation   TORSEMIDE (DEMADEX) 10 MG TABLET    Take one tablet by mouth daily as needed if weight is >175lb   TORSEMIDE (DEMADEX) 20 MG TABLET    Take 1 tablet (20 mg total) by mouth daily.   TRAZODONE (DESYREL) 50 MG TABLET    Take 0.5 tablets (25 mg total) by mouth at bedtime as needed for sleep.  Modified Medications   No medications on file  Discontinued Medications   NYSTATIN (MYCOSTATIN/NYSTOP) 100000 UNIT/GM POWD    APPLY TWICE DAILY TO YOUR SCROTUM AND GENITAL AREA TO COVER THE AREA    Physical Exam: Filed Vitals:   02/02/15 1554  BP: 118/64  Pulse: 71  Temp: 97 F (36.1 C)  TempSrc: Oral  Height: 5\' 11"  (1.803 m)  Weight: 185 lb 12.8 oz (84.278 kg)   Body mass index is 25.93 kg/(m^2).  Physical Exam  Constitutional: He is oriented to person, place, and time. He appears well-developed and well-nourished.  HENT:  Head: Normocephalic and atraumatic.  Right Ear: External ear normal.  Left Ear: External ear normal.  Eyes: Conjunctivae and EOM are normal. Pupils are equal, round, and reactive to light. Right eye exhibits no discharge. Left eye exhibits no discharge.  Neck: Normal range of motion. Neck supple. No JVD present. No tracheal deviation present. No thyromegaly present.  Cardiovascular:  Murmur heard. [id number]  systolic  Pulmonary/Chest: Effort normal and breath sounds normal. No respiratory distress. He has no wheezes. He has no rales.  Abdominal: He exhibits no distension. There is no tenderness.  abd hernia  Genitourinary: No penile tenderness.  Musculoskeletal: Normal range of motion. He exhibits edema. He exhibits no tenderness.  1+ edema BLE  Neurological: He is alert and oriented to person, place, and time. He has normal reflexes. He displays normal reflexes. No cranial nerve deficit. He exhibits normal muscle tone. Coordination normal.  Skin: Skin is warm and dry. No rash noted. No erythema.  Healing pressure ulcer coccyx  Psychiatric: He has a normal mood and affect. His behavior is normal. Judgment and thought content normal.    Labs reviewed: Basic Metabolic Panel:  Recent Labs  07/26/14 1359  12/06/14 1226 12/29/14 1455 12/30/14 0524  NA 140  < > 137 137 138  K 4.8  < > 4.2 4.3 4.2  CL 102  < > 105 104 105  CO2 28  < > 28 27 27   GLUCOSE 83  < > 103* 123* 132*  BUN 17  < > 15 20 15   CREATININE 0.81  < > 0.61 0.98 0.83  CALCIUM 9.3  < > 8.4* 9.1 8.7*  MG 2.4  --   --   --   --   < > = values in this interval not displayed.  Liver Function Tests:  Recent Labs  12/03/14 1238 12/06/14 1226 12/29/14 1455  AST 32 19 27  ALT 32 20 29  ALKPHOS 70 61 65  BILITOT 0.6 0.5 0.6  PROT 7.1 6.6 7.0  ALBUMIN 3.6 3.2* 3.9    CBC:  Recent Labs  07/14/14 1725  12/06/14 1226 12/29/14 1455 12/30/14 0524  WBC 7.4  < > 7.0 13.4* 10.5  NEUTROABS 4.9  --  5.1 11.2*  --   HGB 14.5  < > 12.8* 14.3 12.9*  HCT 43.7  < > 38.9* 42.3 39.2  MCV 96.0  < > 98.2 99.1 99.7  PLT 298.0  < > 204 208 194  < > = values in this interval not displayed.  Lab Results  Component Value Date   TSH 3.250 12/29/2014   Lab Results  Component Value Date   HGBA1C 5.6 12/15/2012   Lab Results  Component Value Date   CHOL  09/19/2009    93        ATP III CLASSIFICATION:  <200     mg/dL    Desirable  200-239  mg/dL   Borderline High  >=240    mg/dL   High          HDL 34* 09/19/2009   LDLCALC  09/19/2009    39        Total Cholesterol/HDL:CHD Risk Coronary Heart Disease Risk Table                     Men   Women  1/2 Average Risk   3.4   3.3  Average Risk       5.0   4.4  2 X Average Risk   9.6   7.1  3 X Average Risk  23.4   11.0        Use the calculated Patient Ratio above and the CHD Risk Table to determine the patient's CHD Risk.        ATP III CLASSIFICATION (LDL):  <100     mg/dL   Optimal  100-129  mg/dL   Near or Above                    Optimal  130-159  mg/dL   Borderline  160-189  mg/dL   High  >190     mg/dL   Very High   TRIG 98 09/19/2009   CHOLHDL 2.7 09/19/2009    Significant Diagnostic Results since last visit: none  Patient Care Team: Deland Pretty, MD as PCP - General (Internal Medicine) [name], MD as Attending Physician (Cardiology) Vevelyn Royals, MD as Consulting Physician (Ophthalmology) Franchot Gallo, MD as Consulting Physician (Urology) Tanda Rockers, MD as Consulting Physician (Pulmonary Disease) Allyn Kenner, MD (Dermatology) Deliah Goody, PA-C as Physician Assistant (Physician Assistant)  Assessment/Plan Problem List Items Addressed This Visit    Urinary frequency    4-6x/night, urology appointment next week.       Upper airway cough syndrome - Primary    Underwent pulmonology evaluation, continue Protonix, Famotidine, Atrovent nasal spray. Update CBC      Sustained ventricular tachycardia (HCC)    Heart rate is in control, continue Amiodarone 400mg  daily.       Relevant Medications   torsemide (DEMADEX) 10 MG tablet   Restless leg    Managed with Requip 0.5mg  daily. Observe.       Pressure ulcer    Coccyx, healing nicely, pressure reduction      Hypothyroidism    Taking Levothyroxine 110mcg, update TSH, last TSH 7.70 10/24/14      Hypokalemia    Update CMP, BNP.       Hypertension    Blood  pressure is controlled with Midodrine and Torsemide.       Relevant Medications   torsemide (DEMADEX) 10 MG tablet   GERD (gastroesophageal reflux disease)    Stable, continue Famotidine and Protonix      Depression with anxiety    Continue Trazodone 50mg , Xanax 0.25mg  bid. Update Hgb A1c      Constipation    Managed, continue Amitiza, Senna, prn  suppository.       Combined congestive systolic and diastolic heart failure (HCC)    Compensated clinically, continue Torsemide, update BNP      Relevant Medications   torsemide (DEMADEX) 10 MG tablet   CAD (coronary artery disease)    No recent chest pain reported, continue ASA, Plavix, Pravachol.       Relevant Medications   torsemide (DEMADEX) 10 MG tablet   Arterial hypotension    Blood pressure is controlled, continue Midodrine.       Relevant Medications   torsemide (DEMADEX) 10 MG tablet       Family/ staff Communication: AL for now.   Labs/tests ordered: CBC, CMP, TSH, BNP, Hgb a1c.   [facility] Marivel Mcclarty NP Geriatrics [facility] [phone], [name] [address] On Call:  [phone] & follow prompts after 5pm & weekends Office Phone:  [phone] Office Fax:  [phone]

## 2015-02-02 NOTE — Assessment & Plan Note (Signed)
Stable, continue Famotidine and Protonix

## 2015-02-02 NOTE — Assessment & Plan Note (Addendum)
Taking Levothyroxine 134mcg, update TSH, last TSH 7.70 10/24/14

## 2015-02-02 NOTE — Assessment & Plan Note (Signed)
Blood pressure is controlled, continue Midodrine.  

## 2015-02-02 NOTE — Assessment & Plan Note (Signed)
Update CMP, BNP.

## 2015-02-02 NOTE — Assessment & Plan Note (Signed)
Managed, continue Amitiza, Senna, prn suppository.

## 2015-02-02 NOTE — Assessment & Plan Note (Signed)
Heart rate is in control, continue Amiodarone 400mg  daily.

## 2015-02-02 NOTE — Assessment & Plan Note (Signed)
No recent chest pain reported, continue ASA, Plavix, Pravachol.

## 2015-02-02 NOTE — Assessment & Plan Note (Signed)
4-6x/night, urology appointment next week.

## 2015-02-02 NOTE — Assessment & Plan Note (Signed)
Coccyx, healing nicely, pressure reduction

## 2015-02-02 NOTE — Assessment & Plan Note (Addendum)
Blood pressure is controlled with Midodrine and Torsemide.

## 2015-02-02 NOTE — Assessment & Plan Note (Addendum)
Continue Trazodone 50mg , Xanax 0.25mg  bid. Update Hgb A1c

## 2015-02-09 ENCOUNTER — Encounter: Payer: Self-pay | Admitting: Nurse Practitioner

## 2015-02-09 ENCOUNTER — Non-Acute Institutional Stay: Payer: Medicare Other | Admitting: Nurse Practitioner

## 2015-02-09 DIAGNOSIS — I951 Orthostatic hypotension: Secondary | ICD-10-CM | POA: Diagnosis not present

## 2015-02-09 DIAGNOSIS — K5900 Constipation, unspecified: Secondary | ICD-10-CM | POA: Diagnosis not present

## 2015-02-09 DIAGNOSIS — R05 Cough: Secondary | ICD-10-CM

## 2015-02-09 DIAGNOSIS — F418 Other specified anxiety disorders: Secondary | ICD-10-CM

## 2015-02-09 DIAGNOSIS — M1000 Idiopathic gout, unspecified site: Secondary | ICD-10-CM | POA: Diagnosis not present

## 2015-02-09 DIAGNOSIS — G2581 Restless legs syndrome: Secondary | ICD-10-CM | POA: Diagnosis not present

## 2015-02-09 DIAGNOSIS — K219 Gastro-esophageal reflux disease without esophagitis: Secondary | ICD-10-CM | POA: Diagnosis not present

## 2015-02-09 DIAGNOSIS — E039 Hypothyroidism, unspecified: Secondary | ICD-10-CM

## 2015-02-09 DIAGNOSIS — M109 Gout, unspecified: Secondary | ICD-10-CM

## 2015-02-09 DIAGNOSIS — I472 Ventricular tachycardia, unspecified: Secondary | ICD-10-CM

## 2015-02-09 DIAGNOSIS — I5042 Chronic combined systolic (congestive) and diastolic (congestive) heart failure: Secondary | ICD-10-CM | POA: Diagnosis not present

## 2015-02-09 DIAGNOSIS — M25569 Pain in unspecified knee: Secondary | ICD-10-CM | POA: Diagnosis not present

## 2015-02-09 DIAGNOSIS — R058 Other specified cough: Secondary | ICD-10-CM

## 2015-02-09 HISTORY — DX: Gout, unspecified: M10.9

## 2015-02-09 NOTE — Progress Notes (Signed)
Patient ID: Adam [name], male   DOB: 31-Aug-1926, 82 y.o.   MRN: [id number]  Location:  [facility] Provider:  Marlana Latus NP  Code Status:  Full code Goals of care: Advanced Directive information    Chief Complaint  Patient presents with  . Medical Management of Chronic Issues     HPI: Patient is a 82 y.o. male seen in the [facility] at [facility] today for evaluation of thyroid, edema, insomnia, Afib, Gout, osteoarthritis, GERD, constipation, orthostatic hypotension, restless leg.   Review of Systems  Constitutional: Negative for weight loss and malaise/fatigue.  HENT: Positive for hearing loss and tinnitus.   Eyes: Negative for double vision, photophobia and pain.  Respiratory: Negative for cough, hemoptysis, sputum production and shortness of breath.   Cardiovascular: Positive for leg swelling. Negative for chest pain and claudication.       1+. Chronic, compression hosiery.   Gastrointestinal: Negative for nausea, vomiting, abdominal pain, diarrhea and constipation.  Genitourinary: Positive for frequency. Negative for hematuria.  Musculoskeletal: Negative for myalgias, back pain, joint pain and neck pain.       Ambulates with walker.   Skin: Negative for itching and rash.       Coccyx pressure ulcer healing nicely  Neurological: Positive for dizziness, sensory change and weakness. Negative for tingling and tremors.       Insomnia, restless legs.   Patient reports that he has H/O dizziness but has been worse in past 4 months. This makes the patient feel unbalanced.   R hand numbness.   Psychiatric/Behavioral: Negative for depression, suicidal ideas, hallucinations and substance abuse. The patient has insomnia. The patient is not nervous/anxious.        Not enough sleep, decreased energy     Past Medical History  Diagnosis Date  . CAD (coronary artery disease)     CABG (LIMA-LAD, SVG-RCA, SVG-OM in 1996).  07/2009 BMS to SVG-RCA. Cath in 04/2010 with patent stents   .  Bradycardia     AFib/SSS s/p [employer] Jude PPM 04/12/2008  . Hypothyroid   . Pacemaker   . Hyperlipidemia   . GERD (gastroesophageal reflux disease)   . Hiatal hernia   . Insomnia   . S/P CABG x 4   . RBBB   . Melanoma of back ([place]) 1976  . Heart murmur     "just told I had one today" (12/15/2012)  . Myocardial infarction [facility]) 1996; 2011    "both silent" (12/15/2012)  . Exertional shortness of breath     "sometimes walking" (12/15/2012)  . Arthritis     "minor, back and sometimes knees" (12/15/2012)  . Sustained ventricular tachycardia ([place]) 07/27/2014  . Hypertension   . [facility] spotted fever   . CHF (congestive heart failure) ([place])   . Osteoporosis, senile     Patient Active Problem List   Diagnosis Date Noted  . Gout 02/09/2015  . Depression with anxiety 02/02/2015  . Restless leg 02/02/2015  . 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 Urinary frequency 02/02/2015  . Pressure ulcer 01/01/2015  . Urinary retention 12/30/2014  . Coronary artery disease with hx of myocardial infarct w/o hx of CABG 12/30/2014  . Fecal impaction ([place]) 12/30/2014  . Expressive aphasia 12/30/2014  . Leukocytosis 12/30/2014  . Constipation   . Fecal impaction in rectum ([place])   . Arterial hypotension   . SOB (shortness of breath) 12/03/2014  . Dehydration 12/03/2014  . Knee pain, acute 12/03/2014  .  Shortness of breath 12/03/2014  . GERD (gastroesophageal reflux disease)   . Hypertension   . Dyspnea 08/11/2014  . Hypothyroidism 08/11/2014  . Sustained ventricular tachycardia ([place]) 07/27/2014  . Upper airway cough syndrome 07/14/2014  . Hypokalemia 01/31/2014  . Sick sinus syndrome ([place]) 01/31/2014  . Hyperlipidemia 01/30/2014  . Chest pain 01/06/2013  . Arm pain, possible anginal equivialnt  12/16/2012  . Cardiomyopathy, ischemic 08/25/2012  . Pacemaker 08/25/2012  . Hypotension, resolved with fluid bolus 06/07/2011  . Chest tightness, negative  MI, Negative Lexiscan myoview 06/05/2011  . Nausea 06/05/2011  . Dizziness, after diuretic asscoiated with hypotension and responded to fluid bolus 06/05/2011  . CAD (coronary artery disease)   . Bradycardia     Allergies  Allergen Reactions  . Crestor [Rosuvastatin Calcium]     Unknown reaction   . Altace [Ramipril] Other (See Comments)    Cough  . Bactrim [Sulfamethoxazole-Trimethoprim] Rash  . Penicillins Rash    Has patient had a PCN reaction causing immediate rash, facial/tongue/throat swelling, SOB or lightheadedness with hypotension: No Has patient had a PCN reaction causing severe rash involving mucus membranes or skin necrosis: Yes Has patient had a PCN reaction that required hospitalization Yes Has patient had a PCN reaction occurring within the last 10 years: No If all of the above answers are "NO", then may proceed with Cephalosporin use.   . Sulfa Antibiotics Rash    Medications: Patient's Medications  New Prescriptions   No medications on file  Previous Medications   ALPRAZOLAM (XANAX) 0.25 MG TABLET    Take 0.25 mg by mouth 2 (two) times daily. Take 1 tab bid   AMIODARONE (PACERONE) 400 MG TABLET    Take 1 tablet (400 mg total) by mouth daily.   AMITIZA 8 MCG CAPSULE    Take 8 mcg by mouth 2 (two) times daily.   ASPIRIN EC 81 MG EC TABLET    Take 1 tablet (81 mg total) by mouth daily.   CHOLECALCIFEROL (VITAMIN D3) 5000 UNITS CAPS    Take 1 capsule by mouth every Monday.    CLOBETASOL OINTMENT (TEMOVATE) 0.05 %    Apply 1 application topically 2 (two) times daily.   CLOPIDOGREL (PLAVIX) 75 MG TABLET    Take 75 mg by mouth daily.   COLCHICINE 0.6 MG TABLET    Take 1 tablet (0.6 mg total) by mouth daily.   [employer] GENTLE LAXATIVE 10 MG SUPPOSITORY    Use as directed   FAMOTIDINE (PEPCID) 20 MG TABLET    Take 20 mg by mouth at bedtime.   IPRATROPIUM (ATROVENT) 0.06 % NASAL SPRAY    Place 2 sprays into both nostrils 2 (two) times daily as needed for rhinitis.     LEVOTHYROXINE (SYNTHROID, LEVOTHROID) 112 MCG TABLET    Take 112 mcg by mouth daily before breakfast.   MELOXICAM (MOBIC) 7.5 MG TABLET    Take 1 tablet (7.5 mg total) by mouth daily.   MIDODRINE (PROAMATINE) 2.5 MG TABLET    Take 1 tablet (2.5 mg total) by mouth 3 (three) times daily with meals. Take third dose at least 6 hours before bedtime   PANTOPRAZOLE (PROTONIX) 40 MG TABLET    TAKE 1 TABLET BY MOUTH TWICE A DAY   PRAVASTATIN (PRAVACHOL) 40 MG TABLET    Take 40 mg by mouth daily.   ROPINIROLE (REQUIP) 0.5 MG TABLET    Take 0.5-1 mg by mouth every evening.   SENNA (SENOKOT) 8.6 MG TABLET  Take one tablet by mouth every other day at bedtime for constipation   TORSEMIDE (DEMADEX) 10 MG TABLET    Take one tablet by mouth daily as needed if weight is >175lb   TORSEMIDE (DEMADEX) 20 MG TABLET    Take 1 tablet (20 mg total) by mouth daily.   TRAZODONE (DESYREL) 50 MG TABLET    Take 0.5 tablets (25 mg total) by mouth at bedtime as needed for sleep.  Modified Medications   No medications on file  Discontinued Medications   No medications on file    Physical Exam: Filed Vitals:   02/09/15 1324  BP: 118/64  Pulse: 68  Temp: 98.1 F (36.7 C)  TempSrc: Tympanic  Resp: 16   There is no weight on file to calculate BMI.  Physical Exam  Constitutional: He is oriented to person, place, and time. He appears well-developed and well-nourished.  HENT:  Head: Normocephalic and atraumatic.  Right Ear: External ear normal.  Left Ear: External ear normal.  Eyes: Conjunctivae and EOM are normal. Pupils are equal, round, and reactive to light. Right eye exhibits no discharge. Left eye exhibits no discharge.  Neck: Normal range of motion. Neck supple. No JVD present. No tracheal deviation present. No thyromegaly present.  Cardiovascular:  Murmur heard. [id number] systolic  Pulmonary/Chest: Effort normal and breath sounds normal. No respiratory distress. He has no wheezes. He has no rales.  Abdominal:  He exhibits no distension. There is no tenderness.  abd hernia  Genitourinary: No penile tenderness.  Musculoskeletal: Normal range of motion. He exhibits edema. He exhibits no tenderness.  1+ edema BLE  Neurological: He is alert and oriented to person, place, and time. He has normal reflexes. He displays normal reflexes. No cranial nerve deficit. He exhibits normal muscle tone. Coordination normal.  Skin: Skin is warm and dry. No rash noted. No erythema.  Healing pressure ulcer coccyx  Psychiatric: He has a normal mood and affect. His behavior is normal. Judgment and thought content normal.    Labs reviewed: Basic Metabolic Panel:  Recent Labs  07/26/14 1359  12/06/14 1226 12/29/14 1455 12/30/14 0524  NA 140  < > 137 137 138  K 4.8  < > 4.2 4.3 4.2  CL 102  < > 105 104 105  CO2 28  < > 28 27 27   GLUCOSE 83  < > 103* 123* 132*  BUN 17  < > 15 20 15   CREATININE 0.81  < > 0.61 0.98 0.83  CALCIUM 9.3  < > 8.4* 9.1 8.7*  MG 2.4  --   --   --   --   < > = values in this interval not displayed.  Liver Function Tests:  Recent Labs  12/03/14 1238 12/06/14 1226 12/29/14 1455  AST 32 19 27  ALT 32 20 29  ALKPHOS 70 61 65  BILITOT 0.6 0.5 0.6  PROT 7.1 6.6 7.0  ALBUMIN 3.6 3.2* 3.9    CBC:  Recent Labs  07/14/14 1725  12/06/14 1226 12/29/14 1455 12/30/14 0524  WBC 7.4  < > 7.0 13.4* 10.5  NEUTROABS 4.9  --  5.1 11.2*  --   HGB 14.5  < > 12.8* 14.3 12.9*  HCT 43.7  < > 38.9* 42.3 39.2  MCV 96.0  < > 98.2 99.1 99.7  PLT 298.0  < > 204 208 194  < > = values in this interval not displayed.  Lab Results  Component Value Date   TSH 3.250 12/29/2014  Lab Results  Component Value Date   HGBA1C 5.6 12/15/2012   Lab Results  Component Value Date   CHOL  09/19/2009    93        ATP III CLASSIFICATION:  <200     mg/dL   Desirable  200-239  mg/dL   Borderline High  >=240    mg/dL   High          HDL 34* 09/19/2009   LDLCALC  09/19/2009    39        Total  Cholesterol/HDL:CHD Risk Coronary Heart Disease Risk Table                     Men   Women  1/2 Average Risk   3.4   3.3  Average Risk       5.0   4.4  2 X Average Risk   9.6   7.1  3 X Average Risk  23.4   11.0        Use the calculated Patient Ratio above and the CHD Risk Table to determine the patient's CHD Risk.        ATP III CLASSIFICATION (LDL):  <100     mg/dL   Optimal  100-129  mg/dL   Near or Above                    Optimal  130-159  mg/dL   Borderline  160-189  mg/dL   High  >190     mg/dL   Very High   TRIG 98 09/19/2009   CHOLHDL 2.7 09/19/2009    Significant Diagnostic Results since last visit: none  Patient Care Team: Deland Pretty, MD as PCP - General (Internal Medicine) [name], MD as Attending Physician (Cardiology) Vevelyn Royals, MD as Consulting Physician (Ophthalmology) Franchot Gallo, MD as Consulting Physician (Urology) Tanda Rockers, MD as Consulting Physician (Pulmonary Disease) Allyn Kenner, MD (Dermatology) Deliah Goody, PA-C as Physician Assistant (Physician Assistant)  Assessment/Plan Problem List Items Addressed This Visit    Upper airway cough syndrome    Underwent pulmonology evaluation, continue Protonix, Famotidine, Atrovent nasal spray. Update CBC      Sustained ventricular tachycardia (HCC)    Heart rate is in control, continue Amiodarone 400mg  daily.       Restless leg - Primary    Stable continue Requip 0.5mg  daily.       Knee pain, acute    Ambulates with walker, pain is managed with Meloxicam.       Hypothyroidism    Continue Levothyroxine 114mcg, update TSH, last TSH 7.70 10/24/14      Gout    No gouty attacks, continue Colchicine 0.6mg  daily, update uric acid.       GERD (gastroesophageal reflux disease)    Stable, continue Protonix and Famotidine      Depression with anxiety    Continue Trazodone 25mg , Xanax 0.125mg  bid. Update Hgb A1c      Constipation    Managed, continue Amitiza, Senna, prn  Bisacodyl suppository and po.       Combined congestive systolic and diastolic heart failure ([place])    Compensated clinically, continue Torsemide, update CMP, BNP      Arterial hypotension    Blood pressure is controlled, continue Midodrine. Contributory to dizziness and unsteadiness associated with position change.           Family/ staff Communication: AL for now.   Labs/tests ordered: CBC, CMP, TSH, BNP,  Hgb a1c, uric acid.   [facility] Deania Siguenza NP Geriatrics [facility] [phone], [place] [address] On Call:  [phone] & follow prompts after 5pm & weekends Office Phone:  [phone] Office Fax:  [phone]

## 2015-02-09 NOTE — Assessment & Plan Note (Signed)
Compensated clinically, continue Torsemide, update CMP, BNP

## 2015-02-09 NOTE — Assessment & Plan Note (Signed)
Ambulates with walker, pain is managed with Meloxicam.

## 2015-02-09 NOTE — Assessment & Plan Note (Addendum)
Managed, continue Amitiza, Senna, prn Bisacodyl suppository and po.

## 2015-02-09 NOTE — Assessment & Plan Note (Signed)
Heart rate is in control, continue Amiodarone 400mg  daily.

## 2015-02-09 NOTE — Assessment & Plan Note (Signed)
Stable continue Requip 0.5mg  daily.

## 2015-02-09 NOTE — Assessment & Plan Note (Signed)
Continue Trazodone 25mg , Xanax 0.125mg  bid. Update Hgb A1c

## 2015-02-09 NOTE — Assessment & Plan Note (Signed)
No gouty attacks, continue Colchicine 0.6mg  daily, update uric acid.

## 2015-02-09 NOTE — Assessment & Plan Note (Signed)
Continue Levothyroxine 165mcg, update TSH, last TSH 7.70 10/24/14

## 2015-02-09 NOTE — Assessment & Plan Note (Signed)
Blood pressure is controlled, continue Midodrine. Contributory to dizziness and unsteadiness associated with position change.

## 2015-02-09 NOTE — Assessment & Plan Note (Addendum)
Stable, continue Protonix and Famotidine

## 2015-02-09 NOTE — Assessment & Plan Note (Signed)
Underwent pulmonology evaluation, continue Protonix, Famotidine, Atrovent nasal spray. Update CBC

## 2015-02-16 LAB — HEPATIC FUNCTION PANEL
ALT: 25 U/L (ref 10–40)
AST: 19 U/L (ref 14–40)
Alkaline Phosphatase: 59 U/L (ref 25–125)
Bilirubin, Total: 0.4 mg/dL

## 2015-02-16 LAB — CBC AND DIFFERENTIAL
HCT: 40 % — AB (ref 41–53)
Hemoglobin: 13.2 g/dL — AB (ref 13.5–17.5)
Platelets: 231 10*3/uL (ref 150–399)
WBC: 5.7 10^3/mL

## 2015-02-16 LAB — BASIC METABOLIC PANEL
BUN: 19 mg/dL (ref 4–21)
Creatinine: 0.9 mg/dL (ref 0.6–1.3)
Glucose: 90 mg/dL
Potassium: 4.3 mmol/L (ref 3.4–5.3)
Sodium: 139 mmol/L (ref 137–147)

## 2015-02-16 LAB — HEMOGLOBIN A1C: Hemoglobin A1C: 5.8

## 2015-02-16 LAB — TSH: TSH: 9.02 u[IU]/mL — AB (ref 0.41–5.90)

## 2015-02-21 ENCOUNTER — Telehealth: Payer: Self-pay

## 2015-02-21 ENCOUNTER — Ambulatory Visit: Payer: Medicare Other | Admitting: Neurology

## 2015-02-21 NOTE — Telephone Encounter (Signed)
Patient did not show to appt today  

## 2015-02-22 ENCOUNTER — Encounter: Payer: Self-pay | Admitting: Neurology

## 2015-02-22 ENCOUNTER — Ambulatory Visit (INDEPENDENT_AMBULATORY_CARE_PROVIDER_SITE_OTHER): Payer: Medicare Other | Admitting: Neurology

## 2015-02-22 VITALS — BP 99/56 | HR 69 | Resp 16 | Ht 71.0 in | Wt 189.0 lb

## 2015-02-22 DIAGNOSIS — R42 Dizziness and giddiness: Secondary | ICD-10-CM | POA: Diagnosis not present

## 2015-02-22 DIAGNOSIS — G4700 Insomnia, unspecified: Secondary | ICD-10-CM

## 2015-02-22 DIAGNOSIS — R269 Unspecified abnormalities of gait and mobility: Secondary | ICD-10-CM

## 2015-02-22 NOTE — Progress Notes (Signed)
Subjective:    Patient ID: Adam [name] is a 82 y.o. male.  HPI     Interim history:  Adam [name] is a 82 year old right-handed gentleman with an underlying complex medical history of hypothyroidism, melanoma in 1976, heart disease, status post CABG in 1996, 4 vessels, and pacemaker placement secondary to bradycardia in 2010, hypertension, hyperlipidemia, reflux disease, insomnia, overweight state and restless leg symptoms, who presents for follow-up consultation of his dizziness and symptoms of off balance. The patient is accompanied by his wife today and missed his appointment on 02/21/15 and we were able to fit him in today. I first met him on 11/22/2014 at the request of his primary care physician, at which time the patient reported a several year history of intermittent dizziness, worse within the previous 4 months. He also reported dream enactments. I suggested he reduce his trazodone and taper of ropinirole. He reported symptoms of restless leg syndrome. I suggested he try low-dose gabapentin.  Today, 02/22/2015: He reports doing fairly well. He has been in assisted living for the past month. He was at [facility] for about a week for rehabilitation. He has adjusted fairly well. He had no recent falls but had a near fall recently according to his wife when he was started on a new bladder medication. This was stopped after 2 days. He uses his 2 wheeled walker. He has been on lower dose of trazodone, 25 mg each night. Insomnia is still a problem. He is still on Requip, 0.5 mg each night and never started the gabapentin for unclear reasons. He tries to drink enough water. Constipation has improved. In the interim, he presented to the ED with severe constipation, but had a BM there and improved. I reviewed the ED notes from 12/06/14. He again presented to the emergency room on 12/29/2014 with a one-week history of constipation. This required disimpaction in the emergency department. He was admitted to  the hospital secondary to urinary retention and discharged on 01/01/2015. I reviewed the hospital records including discharge summary.  Previously:  11/22/2014: He reports problems with dizziness and his balance. This has been ongoing for years, worse in the last 4 months. He has difficulty sleeping. He has restless leg symptoms. He has been on ropinirole low dose for the past 6 months or so. This was recently increased to 0.5 mg strength 1-2 pills at night. He has been taking 1-1/2 pills for the past couple of weeks. He has been on trazodone for the past month. He has a fever year history of dream activity including yelling out in his sleep and he has even rolled out of bed and hit his head against the night table before. Trazodone helps him sleep but his wife feels that his yelling out episodes have become a little bit more frequent since he has been on trazodone. This is 50 mg each night and he has been on it for a month or so. He is not sure if he has had his vitamin B12 level and vitamin D levels checked recently. He is due for a TSH next month through your office. I reviewed your office note from 11/11/2014 which you kindly included. He sees Dr. Melvyn Novas in pulmonology and had a recent CT sinuses which was negative for any acute sinusitis. He sees cardiology, Dr. Marland KitchenC". He had a head CT without contrast on 12/10/2013 which showed atrophy and small vessel disease. I personally reviewed the images through the PACS system and also shared some pictures with the patient  and his family. He had no significant white matter changes but atrophy may have been a little bit more advanced than expected for age in my opinion. He reports a sensation of lightheadedness and insecurity when standing up. He feels like he might fall and has had some near falls but thankfully no actual falls. He denies any spinning sensation but sometimes he feels like something is spinning in his head. He denies any headaches. He does not snore  or have any significant issues with breathing at night. In the past he has tried melatonin for sleep which was marginally helpful. He has had occasional tingling in his fingertips. He is not diabetic. He does not always drink enough water, his family feels. Of note, he has been on amiodarone. He has had lower extremity swelling for the past several years, essentially since his open heart surgery but in the past 6 months his swelling has become significantly worse per wife.    His Past Medical History Is Significant For: Past Medical History  Diagnosis Date  . CAD (coronary artery disease)     CABG (LIMA-LAD, SVG-RCA, SVG-OM in 1996).  07/2009 BMS to SVG-RCA. Cath in 04/2010 with patent stents   . Bradycardia     AFib/SSS s/p St Jude PPM 04/12/2008  . Hypothyroid   . Pacemaker   . Hyperlipidemia   . GERD (gastroesophageal reflux disease)   . Hiatal hernia   . Insomnia   . S/P CABG x 4   . RBBB   . Melanoma of back ([place]) 1976  . Heart murmur     "just told I had one today" (12/15/2012)  . Myocardial infarction [facility]) 1996; 2011    "both silent" (12/15/2012)  . Exertional shortness of breath     "sometimes walking" (12/15/2012)  . Arthritis     "minor, back and sometimes knees" (12/15/2012)  . Sustained ventricular tachycardia ([place]) 07/27/2014  . Hypertension   . [facility] spotted fever   . CHF (congestive heart failure) ([place])   . Osteoporosis, senile     His Past Surgical History Is Significant For: Past Surgical History  Procedure Laterality Date  . US echocardiography  07/11/2009    EF 45-50%  . Nm myoview ltd  06/2011    low risk  . Coronary artery bypass graft  1996    LIMA to LAD,SVG to RCA & SVG to OM  . Tonsillectomy  1938  . Coronary angioplasty with stent placement  07/2009    bare metal stent to SVG to the RCA  . Cardiac catheterization  04/2010    LIMA to LAD patent,SVG to OM patent,no in-stnet restenosis RCA  . Cataract extraction w/ intraocular lens  implant,  bilateral Bilateral 2012  . Melanoma excision  05/1974 X2    "taken off my back" (12/15/2012)  . Transurethral resection of prostate  1986  . Insert / replace / remove pacemaker  2010    His Family History Is Significant For: Family History  Problem Relation Age of Onset  . Coronary artery disease Mother   . Diabetes Mother   . Heart disease Mother   . Coronary artery disease Father   . Diabetes Father   . Lung cancer Father   . Heart disease Father   . Heart disease Brother     His Social History Is Significant For: Social History   Social History  . Marital Status: Married    Spouse Name: N/A  . Number of Children: 2  . Years  of Education: Masters   Occupational History  . Retired Company secretary -Pensions consultant    Social History Main Topics  . Smoking status: Never Smoker   . Smokeless tobacco: Never Used  . Alcohol Use: No  . Drug Use: No  . Sexual Activity: No   Other Topics Concern  . None   Social History Narrative   Lives at [place] to [place] 01/09/15   Married - Violet   Never smoked   Alcohol none   Exercise         Diet:Low sodium   Do you drink/eat things with caffeine? No   Marital status: Married                              What year were you married?1950   Do you live in a house, apartment, assisted living, condo, trailer, etc)?    Is it one or more stories? 1   How many persons live in your home? 2   Do you have any pets in your home? No   Current or past profession: Minister, [employer]   Do you exercise?     Very Little                                                 Type & how often:    Do you have a living will?  Yes   Do you have a DNR Form? Yes   Do you have a POA/HPOA forms? Yes    His Allergies Are:  Allergies  Allergen Reactions  . Crestor [Rosuvastatin Calcium]     Unknown reaction   . Altace [Ramipril] Other (See Comments)    Cough  . Bactrim [Sulfamethoxazole-Trimethoprim] Rash  . Penicillins Rash     Has patient had a PCN reaction causing immediate rash, facial/tongue/throat swelling, SOB or lightheadedness with hypotension: No Has patient had a PCN reaction causing severe rash involving mucus membranes or skin necrosis: Yes Has patient had a PCN reaction that required hospitalization Yes Has patient had a PCN reaction occurring within the last 10 years: No If all of the above answers are "NO", then may proceed with Cephalosporin use.   . Sulfa Antibiotics Rash  :   His Current Medications Are:  Outpatient Encounter Prescriptions as of 02/22/2015  Medication Sig  . ALPRAZolam (XANAX) 0.25 MG tablet Take 0.25 mg by mouth 2 (two) times daily. Take 1 tab bid  . amiodarone (PACERONE) 400 MG tablet Take 1 tablet (400 mg total) by mouth daily.  . AMITIZA 8 MCG capsule Take 8 mcg by mouth 2 (two) times daily.  Marland Kitchen aspirin EC 81 MG EC tablet Take 1 tablet (81 mg total) by mouth daily.  . Cholecalciferol (VITAMIN D3) 5000 UNITS CAPS Take 1 capsule by mouth every Monday. Reported on 02/22/2015  . clobetasol ointment (TEMOVATE) 1.06 % Apply 1 application topically 2 (two) times daily.  . clopidogrel (PLAVIX) 75 MG tablet Take 75 mg by mouth daily.  Marland Kitchen ipratropium (ATROVENT) 0.06 % nasal spray Place 2 sprays into both nostrils 2 (two) times daily as needed for rhinitis.   Marland Kitchen levothyroxine (SYNTHROID, LEVOTHROID) 112 MCG tablet Take 112 mcg by mouth daily before breakfast.  . meloxicam (MOBIC) 7.5 MG tablet Take 1 tablet (7.5 mg total) by mouth  daily.  . midodrine (PROAMATINE) 2.5 MG tablet Take 1 tablet (2.5 mg total) by mouth 3 (three) times daily with meals. Take third dose at least 6 hours before bedtime  . pantoprazole (PROTONIX) 40 MG tablet TAKE 1 TABLET BY MOUTH TWICE A DAY  . pravastatin (PRAVACHOL) 40 MG tablet Take 40 mg by mouth daily.  Marland Kitchen rOPINIRole (REQUIP) 0.5 MG tablet Take 0.5-1 mg by mouth every evening.  . senna (SENOKOT) 8.6 MG tablet Take one tablet by mouth every other day at  bedtime for constipation  . torsemide (DEMADEX) 10 MG tablet Take one tablet by mouth daily as needed if weight is >175lb  . torsemide (DEMADEX) 20 MG tablet Take 1 tablet (20 mg total) by mouth daily. (Patient taking differently: Take 10 mg by mouth daily as needed. Take if dry weight is above 175lbs or above)  . traZODone (DESYREL) 50 MG tablet Take 0.5 tablets (25 mg total) by mouth at bedtime as needed for sleep.  . colchicine 0.6 MG tablet Take 1 tablet (0.6 mg total) by mouth daily. (Patient not taking: Reported on 02/22/2015)  . [employer] GENTLE LAXATIVE 10 MG suppository Reported on 02/22/2015  . famotidine (PEPCID) 20 MG tablet Take 20 mg by mouth at bedtime. Reported on 02/22/2015   No facility-administered encounter medications on file as of 02/22/2015.  :  Review of Systems:  Out of a complete 14 point review of systems, all are reviewed and negative with the exception of these symptoms as listed below:   Review of Systems  Neurological:       Patient reports trouble sleeping, states that he has insomnia.   Patient is still taking Requip and never started gabapentin.     Objective:  Neurologic Exam  Physical Exam Physical Examination:   Filed Vitals:   02/22/15 0943  BP: 99/56  Pulse: 69  Resp: 16   General Examination: The patient is a very pleasant 82 y.o. male in no acute distress. He appears well-developed and well-nourished and very well groomed. He is in good spirits today.  HEENT: Normocephalic, atraumatic, pupils are equal, round and reactive to light and accommodation. Extraocular tracking is good without limitation to gaze excursion or nystagmus noted. Normal smooth pursuit is noted. Hearing is grossly intact. Face is symmetric with normal facial animation and normal facial sensation. Speech is clear with no dysarthria noted. There is no hypophonia. There is no lip, neck/head, jaw or voice tremor. Neck is perhaps slightly rigid, and he may have had a very slight lower  jaw tremor intermittently. This was very brief. There are no carotid bruits on auscultation. Oropharynx exam reveals: mild mouth dryness, adequate dental hygiene and mild airway crowding.  He has several missing teeth, he is not wearing his partials. Mallampati is class II. Tongue protrudes centrally and palate elevates symmetrically.   Chest: Clear to auscultation without wheezing, rhonchi or crackles noted.  Heart: S1+S2+0, regular and normal without murmurs, rubs or gallops noted.   Abdomen: Soft, non-tender and non-distended with normal bowel sounds appreciated on auscultation.  Extremities: There is 2+ pitting edema in the distal lower extremities bilaterally right side more prominent than left. Pedal pulses are intact.  Skin: Warm and dry without trophic changes noted. There are no varicose veins.  Musculoskeletal: exam reveals no obvious joint deformities, tenderness or joint swelling or erythema.   Neurologically:  Mental status: The patient is awake, alert and oriented in all 4 spheres. His immediate and remote memory, attention, language skills and  fund of knowledge are appropriate. There is no evidence of aphasia, agnosia, apraxia or anomia. Speech is clear with normal prosody and enunciation. Thought process is linear. Mood is normal and affect is normal.  Cranial nerves II - XII are as described above under HEENT exam. In addition: shoulder shrug is normal with equal shoulder height noted. Motor exam: Normal bulk, strength for age and tone is noted, but  He does appear to have a hard time relaxing. There is no drift, tremor or rebound. Reflexes are 1+ throughout. Fine motor skills and coordination are mildly impaired globally, maybe right a little bit worse than left especially in the lower extremities. Heel-to-shin is not possible. He stands up slowly and has to push himself up. He takes several seconds to get his bearings. He reports mild lightheadedness and reports mild spinning  sensation in his head. Romberg is not possible to do as he cannot stand without using his walker or holding onto something.  A unless the reevaluation. He walks slowly and cautiously. He uses his  2 wheeled walker. Cerebellar testing: No dysmetria or intention tremor on finger to nose testing.  Sensory exam: intact to light touch in the upper and lower extremities, somewhat decreased for vibration in the distal lower extremities but this could be confounded by the swelling.           Assessment and Plan:   In summary, UNNAMED ZEIEN is a very pleasant 82 year old male with an underlying complex medical history of hypothyroidism, melanoma in 1976, heart disease, status post CABG in 1996, 4 vessels, and pacemaker placement secondary to bradycardia in 2010, hypertension, hyperlipidemia, reflux disease, insomnia, overweight state and restless leg symptoms, who  He presents for follow-up consultation of his dizziness and balance problems. His exam is nonfocal. He is reassured in that regard. He does report nonspecific lightheadedness upon standing. His blood pressure is indeed a little low today and lower than his typical. He is advised to continue to use his walker at all times and change positions slowly, get his bearings when he stands up and then start walking slowly. He is advised to stay well-hydrated. I furthermore advised that sedating medications be limited as much is possible.  I would recommend tapering off trazodone completely if possible. He could try melatonin, 3-5 mg, 1-2 hours before bedtime. He continues to take low-dose ropinirole. I did not suggest any changes in that regard today.  He also has a history of dream enactments. While he does not have any telltale signs of parkinsonism, we will monitor his exam for this. For his lower extremity swelling he is advised to use his compression stockings during the day and elevate his legs when he can.  He is on as needed torsemide. He had a nuclear  medicine scan in December 2015 which showed an EF of 45%. I would like to see him back in 6 months, sooner if needed.

## 2015-02-22 NOTE — Patient Instructions (Signed)
  You can try Melatonin at night for sleep: take 3 to 5 mg one hour before your bedtime.  Use your walker at all times. Change positions slowly.  I would like to suggest you taper off of trazodone completely.

## 2015-02-23 ENCOUNTER — Encounter: Payer: Self-pay | Admitting: Nurse Practitioner

## 2015-02-23 ENCOUNTER — Non-Acute Institutional Stay: Payer: Medicare Other | Admitting: Nurse Practitioner

## 2015-02-23 ENCOUNTER — Other Ambulatory Visit: Payer: Self-pay | Admitting: Nurse Practitioner

## 2015-02-23 DIAGNOSIS — R058 Other specified cough: Secondary | ICD-10-CM

## 2015-02-23 DIAGNOSIS — E876 Hypokalemia: Secondary | ICD-10-CM | POA: Diagnosis not present

## 2015-02-23 DIAGNOSIS — I10 Essential (primary) hypertension: Secondary | ICD-10-CM | POA: Diagnosis not present

## 2015-02-23 DIAGNOSIS — M25569 Pain in unspecified knee: Secondary | ICD-10-CM | POA: Diagnosis not present

## 2015-02-23 DIAGNOSIS — F418 Other specified anxiety disorders: Secondary | ICD-10-CM

## 2015-02-23 DIAGNOSIS — K219 Gastro-esophageal reflux disease without esophagitis: Secondary | ICD-10-CM | POA: Diagnosis not present

## 2015-02-23 DIAGNOSIS — R05 Cough: Secondary | ICD-10-CM | POA: Diagnosis not present

## 2015-02-23 DIAGNOSIS — K5900 Constipation, unspecified: Secondary | ICD-10-CM | POA: Diagnosis not present

## 2015-02-23 DIAGNOSIS — G8929 Other chronic pain: Secondary | ICD-10-CM

## 2015-02-23 DIAGNOSIS — G2581 Restless legs syndrome: Secondary | ICD-10-CM | POA: Diagnosis not present

## 2015-02-23 DIAGNOSIS — I472 Ventricular tachycardia, unspecified: Secondary | ICD-10-CM

## 2015-02-23 DIAGNOSIS — E039 Hypothyroidism, unspecified: Secondary | ICD-10-CM | POA: Diagnosis not present

## 2015-02-23 DIAGNOSIS — I5040 Unspecified combined systolic (congestive) and diastolic (congestive) heart failure: Secondary | ICD-10-CM | POA: Diagnosis not present

## 2015-02-23 NOTE — Assessment & Plan Note (Signed)
Blood pressure is controlled with Midodrine and Torsemide.

## 2015-02-23 NOTE — Assessment & Plan Note (Signed)
Increase Levothyroxine 135 mcg, TSH 7.70 10/24/14, 9.021 02/16/15, TSH 8 weeks.

## 2015-02-23 NOTE — Assessment & Plan Note (Signed)
Underwent pulmonology evaluation, continue Protonix, Famotidine, Atrovent nasal spray.

## 2015-02-23 NOTE — Assessment & Plan Note (Signed)
Managed, continue Amitiza, Senna, prn Bisacodyl suppository and po.

## 2015-02-23 NOTE — Assessment & Plan Note (Signed)
Stable, continue Protonix and Famotidine

## 2015-02-23 NOTE — Assessment & Plan Note (Signed)
Stable continue Requip 0.5mg  daily.

## 2015-02-23 NOTE — Assessment & Plan Note (Signed)
Continue Trazodone 25mg , Xanax 0.125mg  bid.

## 2015-02-23 NOTE — Assessment & Plan Note (Signed)
Ambulates with walker, pain is managed with Meloxicam.

## 2015-02-23 NOTE — Assessment & Plan Note (Signed)
Heart rate is in control, continue Amiodarone 400mg  daily.

## 2015-02-23 NOTE — Assessment & Plan Note (Signed)
02/21/15 K 4.3

## 2015-02-23 NOTE — Progress Notes (Signed)
Patient ID: Adam [name], male   DOB: [birth year], 82 y.o.   MRN: [id number]  Location:  [facility] Provider:  Marlana Latus NP  Code Status:  Full code Goals of care: Advanced Directive information    Chief Complaint  Patient presents with  . Medical Management of Chronic Issues  . Acute Visit    thyroid     HPI: Patient is a 82 y.o. male seen in the AL at [facility] today for evaluation of thyroid, taking Levothyroxine 139mcg, TSH trending up to 9.021 02/21/15, Hx of edema, insomnia, Afib, Gout, osteoarthritis, GERD, constipation, orthostatic hypotension, restless leg.   Review of Systems  Constitutional: Negative for weight loss and malaise/fatigue.  HENT: Positive for hearing loss and tinnitus.   Eyes: Negative for double vision, photophobia and pain.  Respiratory: Negative for cough, hemoptysis, sputum production and shortness of breath.   Cardiovascular: Positive for leg swelling. Negative for chest pain and claudication.       1+. Chronic, compression hosiery.   Gastrointestinal: Negative for nausea, vomiting, abdominal pain, diarrhea and constipation.  Genitourinary: Positive for frequency. Negative for hematuria.  Musculoskeletal: Negative for myalgias, back pain, joint pain and neck pain.       Ambulates with walker.   Skin: Negative for itching and rash.       Coccyx pressure ulcer healing nicely  Neurological: Positive for dizziness, sensory change and weakness. Negative for tingling and tremors.       Insomnia, restless legs.   Patient reports that he has H/O dizziness but has been worse in past 4 months. This makes the patient feel unbalanced.   R hand numbness.   Psychiatric/Behavioral: Negative for depression, suicidal ideas, hallucinations and substance abuse. The patient has insomnia. The patient is not nervous/anxious.        Not enough sleep, decreased energy     Past Medical History  Diagnosis Date  . CAD (coronary artery disease)     CABG  (LIMA-LAD, SVG-RCA, SVG-OM in 1996).  07/2009 BMS to SVG-RCA. Cath in 04/2010 with patent stents   . Bradycardia     AFib/SSS s/p St Jude PPM 04/12/2008  . Hypothyroid   . Pacemaker   . Hyperlipidemia   . GERD (gastroesophageal reflux disease)   . Hiatal hernia   . Insomnia   . S/P CABG x 4   . RBBB   . Melanoma of back ([place]) 1976  . Heart murmur     "just told I had one today" (12/15/2012)  . Myocardial infarction [facility]) 1996; 2011    "both silent" (12/15/2012)  . Exertional shortness of breath     "sometimes walking" (12/15/2012)  . Arthritis     "minor, back and sometimes knees" (12/15/2012)  . Sustained ventricular tachycardia ([place]) 07/27/2014  . Hypertension   . [facility] spotted fever   . CHF (congestive heart failure) ([place])   . Osteoporosis, senile     Patient Active Problem List   Diagnosis Date Noted  . Gout 02/09/2015  . Depression with anxiety 02/02/2015  . Restless leg 02/02/2015  . Combined congestive systolic and diastolic heart failure ([place]) 02/02/2015  . Urinary frequency 02/02/2015  . Pressure ulcer 01/01/2015  . Urinary retention 12/30/2014  . Coronary artery disease with hx of myocardial infarct w/o hx of CABG 12/30/2014  . Fecal impaction ([place]) 12/30/2014  . Expressive aphasia 12/30/2014  . Leukocytosis 12/30/2014  . Constipation   . Fecal impaction in rectum ([place])   . Arterial hypotension   .  SOB (shortness of breath) 12/03/2014  . Dehydration 12/03/2014  . Chronic knee pain 12/03/2014  . Shortness of breath 12/03/2014  . GERD (gastroesophageal reflux disease)   . Hypertension   . Dyspnea 08/11/2014  . Hypothyroidism 08/11/2014  . Sustained ventricular tachycardia ([place]) 07/27/2014  . Upper airway cough syndrome 07/14/2014  . Hypokalemia 01/31/2014  . Sick sinus syndrome ([place]) 01/31/2014  . Hyperlipidemia 01/30/2014  . Chest pain 01/06/2013  . Arm pain, possible anginal equivialnt  12/16/2012  . Cardiomyopathy, ischemic 08/25/2012  .  Pacemaker 08/25/2012  . Hypotension, resolved with fluid bolus 06/07/2011  . Chest tightness, negative MI, Negative Lexiscan myoview 06/05/2011  . Nausea 06/05/2011  . Dizziness, after diuretic asscoiated with hypotension and responded to fluid bolus 06/05/2011  . CAD (coronary artery disease)   . Bradycardia     Allergies  Allergen Reactions  . Crestor [Rosuvastatin Calcium]     Unknown reaction   . Altace [Ramipril] Other (See Comments)    Cough  . Bactrim [Sulfamethoxazole-Trimethoprim] Rash  . Penicillins Rash    Has patient had a PCN reaction causing immediate rash, facial/tongue/throat swelling, SOB or lightheadedness with hypotension: No Has patient had a PCN reaction causing severe rash involving mucus membranes or skin necrosis: Yes Has patient had a PCN reaction that required hospitalization Yes Has patient had a PCN reaction occurring within the last 10 years: No If all of the above answers are "NO", then may proceed with Cephalosporin use.   . Sulfa Antibiotics Rash    Medications: Patient's Medications  New Prescriptions   No medications on file  Previous Medications   ALPRAZOLAM (XANAX) 0.25 MG TABLET    Take 0.25 mg by mouth 2 (two) times daily. Take 1 tab bid   AMIODARONE (PACERONE) 400 MG TABLET    Take 1 tablet (400 mg total) by mouth daily.   AMITIZA 8 MCG CAPSULE    Take 8 mcg by mouth 2 (two) times daily.   ASPIRIN EC 81 MG EC TABLET    Take 1 tablet (81 mg total) by mouth daily.   CHOLECALCIFEROL (VITAMIN D3) 5000 UNITS CAPS    Take 1 capsule by mouth every Monday. Reported on 02/22/2015   CLOBETASOL OINTMENT (TEMOVATE) 0.05 %    Apply 1 application topically 2 (two) times daily.   CLOPIDOGREL (PLAVIX) 75 MG TABLET    Take 75 mg by mouth daily.   COLCHICINE 0.6 MG TABLET    Take 1 tablet (0.6 mg total) by mouth daily.   [employer] GENTLE LAXATIVE 10 MG SUPPOSITORY    Reported on 02/22/2015   FAMOTIDINE (PEPCID) 20 MG TABLET    Take 20 mg by mouth at bedtime.  Reported on 02/22/2015   IPRATROPIUM (ATROVENT) 0.06 % NASAL SPRAY    Place 2 sprays into both nostrils 2 (two) times daily as needed for rhinitis.    LEVOTHYROXINE (SYNTHROID, LEVOTHROID) 112 MCG TABLET    Take 112 mcg by mouth daily before breakfast.   MELOXICAM (MOBIC) 7.5 MG TABLET    Take 1 tablet (7.5 mg total) by mouth daily.   MIDODRINE (PROAMATINE) 2.5 MG TABLET    Take 1 tablet (2.5 mg total) by mouth 3 (three) times daily with meals. Take third dose at least 6 hours before bedtime   PANTOPRAZOLE (PROTONIX) 40 MG TABLET    TAKE 1 TABLET BY MOUTH TWICE A DAY   PRAVASTATIN (PRAVACHOL) 40 MG TABLET    Take 40 mg by mouth daily.   ROPINIROLE (REQUIP) 0.5  MG TABLET    Take 0.5-1 mg by mouth every evening.   SENNA (SENOKOT) 8.6 MG TABLET    Take one tablet by mouth every other day at bedtime for constipation   TORSEMIDE (DEMADEX) 10 MG TABLET    Take one tablet by mouth daily as needed if weight is >175lb   TORSEMIDE (DEMADEX) 20 MG TABLET    Take 1 tablet (20 mg total) by mouth daily.   TRAZODONE (DESYREL) 50 MG TABLET    Take 0.5 tablets (25 mg total) by mouth at bedtime as needed for sleep.  Modified Medications   No medications on file  Discontinued Medications   No medications on file    Physical Exam: Filed Vitals:   02/23/15 1231  BP: 120/60  Pulse: 68  Temp: 98.8 F (37.1 C)  TempSrc: Tympanic  Resp: 18   There is no weight on file to calculate BMI.  Physical Exam  Constitutional: He is oriented to person, place, and time. He appears well-developed and well-nourished.  HENT:  Head: Normocephalic and atraumatic.  Right Ear: External ear normal.  Left Ear: External ear normal.  Eyes: Conjunctivae and EOM are normal. Pupils are equal, round, and reactive to light. Right eye exhibits no discharge. Left eye exhibits no discharge.  Neck: Normal range of motion. Neck supple. No JVD present. No tracheal deviation present. No thyromegaly present.  Cardiovascular:  Murmur  heard. 99991111 systolic  Pulmonary/Chest: Effort normal and breath sounds normal. No respiratory distress. He has no wheezes. He has no rales.  Abdominal: He exhibits no distension. There is no tenderness.  abd hernia  Genitourinary: No penile tenderness.  Musculoskeletal: Normal range of motion. He exhibits edema. He exhibits no tenderness.  1+ edema BLE  Neurological: He is alert and oriented to person, place, and time. He has normal reflexes. He displays normal reflexes. No cranial nerve deficit. He exhibits normal muscle tone. Coordination normal.  Skin: Skin is warm and dry. No rash noted. No erythema.  Healing pressure ulcer coccyx  Psychiatric: He has a normal mood and affect. His behavior is normal. Judgment and thought content normal.    Labs reviewed: Basic Metabolic Panel:  Recent Labs  07/26/14 1359  12/06/14 1226 12/29/14 1455 12/30/14 0524 02/16/15  NA 140  < > 137 137 138 139  K 4.8  < > 4.2 4.3 4.2 4.3  CL 102  < > 105 104 105  --   CO2 28  < > 28 27 27   --   GLUCOSE 83  < > 103* 123* 132*  --   BUN 17  < > 15 20 15 19   CREATININE 0.81  < > 0.61 0.98 0.83 0.9  CALCIUM 9.3  < > 8.4* 9.1 8.7*  --   MG 2.4  --   --   --   --   --   < > = values in this interval not displayed.  Liver Function Tests:  Recent Labs  12/03/14 1238 12/06/14 1226 12/29/14 1455 02/16/15  AST 32 19 27 19   ALT 32 20 29 25   ALKPHOS 70 61 65 59  BILITOT 0.6 0.5 0.6  --   PROT 7.1 6.6 7.0  --   ALBUMIN 3.6 3.2* 3.9  --     CBC:  Recent Labs  07/14/14 1725  12/06/14 1226 12/29/14 1455 12/30/14 0524 02/16/15  WBC 7.4  < > 7.0 13.4* 10.5 5.7  NEUTROABS 4.9  --  5.1 11.2*  --   --  HGB 14.5  < > 12.8* 14.3 12.9* 13.2*  HCT 43.7  < > 38.9* 42.3 39.2 40*  MCV 96.0  < > 98.2 99.1 99.7  --   PLT 298.0  < > 204 208 194 231  < > = values in this interval not displayed.  Lab Results  Component Value Date   TSH 9.02* 02/16/2015   Lab Results  Component Value Date   HGBA1C 5.8  02/16/2015   Lab Results  Component Value Date   CHOL  09/19/2009    93        ATP III CLASSIFICATION:  <200     mg/dL   Desirable  200-239  mg/dL   Borderline High  >=240    mg/dL   High          HDL 34* 09/19/2009   LDLCALC  09/19/2009    39        Total Cholesterol/HDL:CHD Risk Coronary Heart Disease Risk Table                     Men   Women  1/2 Average Risk   3.4   3.3  Average Risk       5.0   4.4  2 X Average Risk   9.6   7.1  3 X Average Risk  23.4   11.0        Use the calculated Patient Ratio above and the CHD Risk Table to determine the patient's CHD Risk.        ATP III CLASSIFICATION (LDL):  <100     mg/dL   Optimal  100-129  mg/dL   Near or Above                    Optimal  130-159  mg/dL   Borderline  160-189  mg/dL   High  >190     mg/dL   Very High   TRIG 98 09/19/2009   CHOLHDL 2.7 09/19/2009    Significant Diagnostic Results since last visit: none  Patient Care Team: Deland Pretty, MD as PCP - General (Internal Medicine) [name], MD as Attending Physician (Cardiology) Vevelyn Royals, MD as Consulting Physician (Ophthalmology) Franchot Gallo, MD as Consulting Physician (Urology) Tanda Rockers, MD as Consulting Physician (Pulmonary Disease) Allyn Kenner, MD (Dermatology) Deliah Goody, PA-C as Physician Assistant (Physician Assistant)  Assessment/Plan Problem List Items Addressed This Visit    Upper airway cough syndrome    Underwent pulmonology evaluation, continue Protonix, Famotidine, Atrovent nasal spray.       Sustained ventricular tachycardia (HCC)    Heart rate is in control, continue Amiodarone 400mg  daily.       Restless leg    Stable continue Requip 0.5mg  daily.       Hypothyroidism - Primary    Increase Levothyroxine 135 mcg, TSH 7.70 10/24/14, 9.021 02/16/15, TSH 8 weeks.       Hypokalemia    02/21/15 K 4.3       Hypertension    Blood pressure is controlled with Midodrine and Torsemide.       GERD  (gastroesophageal reflux disease)    Stable, continue Protonix and Famotidine      Depression with anxiety    Continue Trazodone 25mg , Xanax 0.125mg  bid.       Constipation    Managed, continue Amitiza, Senna, prn Bisacodyl suppository and po.       Combined congestive systolic and diastolic heart failure ([place])  Compensated clinically, continue Torsemide, BNP 96.8 02/21/15      Chronic knee pain    Ambulates with walker, pain is managed with Meloxicam.           Family/ staff Communication: continue AL for care needs.   Labs/tests ordered: TSH 8 weeks.   [facility] Mast NP  [facility] [phone], [place] [address] On Call:  [phone] & follow prompts after 5pm & weekends Office Phone:  [phone] Office Fax:  [phone]

## 2015-02-23 NOTE — Assessment & Plan Note (Signed)
Compensated clinically, continue Torsemide, BNP 96.8 02/21/15

## 2015-03-16 ENCOUNTER — Telehealth: Payer: Self-pay | Admitting: Cardiovascular Disease

## 2015-03-16 MED ORDER — MIDODRINE HCL 5 MG PO TABS: 5.0000 mg | ORAL_TABLET | Freq: Three times a day (TID) | ORAL | Status: DC

## 2015-03-16 NOTE — Telephone Encounter (Signed)
Wife called concerned for pt having continued dizziness, low BPs despite adequate PO intake, use of compression stockings. Pt c/o dizziness persistently, moreso on standing, he has noted some improvement w/ proamitine, and Dr. Sallyanne Kuster has addressed meds causative to dropping BP as much as able.  Pt denies other acute symptoms. Discussed w Dr. Sallyanne Kuster who is very familiar w/ pt's case and attempted treatments. Dr. Sallyanne Kuster recommended increase of proamitine from 2.5mg  TID to 5mg  TID. Noted, Rx printed. [facility] at [facility] and notified caller of med change, Rx faxed w Dr. Victorino December signature.

## 2015-03-16 NOTE — Telephone Encounter (Signed)
Please call [phone]

## 2015-03-16 NOTE — Telephone Encounter (Signed)
New message  Pt wife called for appt pt having very bad dizzy spells

## 2015-03-23 ENCOUNTER — Encounter: Payer: Self-pay | Admitting: Nurse Practitioner

## 2015-03-23 DIAGNOSIS — G4700 Insomnia, unspecified: Secondary | ICD-10-CM | POA: Insufficient documentation

## 2015-03-29 ENCOUNTER — Telehealth: Payer: Self-pay | Admitting: Cardiovascular Disease

## 2015-03-29 NOTE — Telephone Encounter (Signed)
I would give it at least a couple of days before we make additional changes. Is he drinking enough water?

## 2015-03-29 NOTE — Telephone Encounter (Signed)
New Message  Pt wife calling to speak w/ RN- stated that pt c/o of same dizziness spells as last week- pt now in skilled nursing rehab. Please call back and discuss.

## 2015-03-29 NOTE — Telephone Encounter (Signed)
Wife states fluid intake adequate. I did tell her you may advise to wait & observe.

## 2015-03-29 NOTE — Telephone Encounter (Signed)
Spoke w/ mrs. [place] - she wanted to thank [place] for how well pt improved from 2 weeks ago. She notes his dizziness had been much improved w/ the recommended midodrine increase.  She states today that the patient has been very dizzy again. She notes this most recent onset came early AM. He got up today ~4am, she states he was unable to sleep well/could not go to sleep last night.  Aware that I will route to Dr. Sallyanne Kuster for best recommendation, whether advised to redose meds or wait & observe.

## 2015-03-29 NOTE — Telephone Encounter (Signed)
Follow Up   Pt wife called again to discuss dizziness. She believes that it is due to medication changes

## 2015-03-29 NOTE — Telephone Encounter (Signed)
Pt's wife called. She is concerned that her husband is dizzy. Apparently not that orthostatic according to the nurses at [facility] where he resides. His B/P went from 120 laying to 100 sitting. She could not say if his HR was fast but that was not reported to her by the nurse. I called and spoke with Dr Sallyanne Kuster. He does not feel the pt's Amiodarone can be decreased. I will arrange for the pt to be seen in the office tomorrow.   Kerin Ransom PA-C 03/29/2015 5:50 PM

## 2015-03-30 ENCOUNTER — Encounter: Payer: Self-pay | Admitting: Cardiology

## 2015-03-30 ENCOUNTER — Ambulatory Visit (INDEPENDENT_AMBULATORY_CARE_PROVIDER_SITE_OTHER): Payer: Medicare Other | Admitting: Cardiology

## 2015-03-30 VITALS — BP 112/62 | HR 70 | Ht 71.0 in | Wt 183.2 lb

## 2015-03-30 DIAGNOSIS — I2510 Atherosclerotic heart disease of native coronary artery without angina pectoris: Secondary | ICD-10-CM

## 2015-03-30 DIAGNOSIS — I10 Essential (primary) hypertension: Secondary | ICD-10-CM

## 2015-03-30 DIAGNOSIS — I255 Ischemic cardiomyopathy: Secondary | ICD-10-CM

## 2015-03-30 NOTE — Patient Instructions (Signed)
Medication Instructions:  Your physician has recommended you make the following change in your medication:  1.  ONLY GIVE TORSEMIDE IF PT HAS WEIGHT GAIN OF 3 LBS OR MORE IN A 24 HOUR PERIOD   Labwork: None ordered  Testing/Procedures: NONE ORDERED  Follow-Up: Your physician recommends that you schedule a follow-up appointment in: [place]    Any Other Special Instructions Will Be Listed Below (If Applicable). Pt will need to continue to wear compression stockings   If you need a refill on your cardiac medications before your next appointment, please call your pharmacy.

## 2015-03-30 NOTE — Progress Notes (Signed)
03/30/2015 Adam [name]   Jun 06, 1926  [id number]  Primary Physician Horatio Pel, MD Primary Cardiologist: Dr. Sallyanne Kuster   Reason for Visit/CC: Symptomatic Orthostatic Hypotension  HPI:  82 y/o assisted living resident, who is followed by Dr. Sallyanne Kuster. He has a h/o severe symptomatic orthostatic hypotension , coronary artery disease with ischemic cardiomyopathy and history of sustained ventricular tachycardia, sinus node dysfunction with tachycardia-bradycardia syndrome, paroxysmal atrial fibrillation, dual-chamber permanent pacemaker. in the past [name] required multiple medications for control of his hypertension, over the last few years these medications have been gradually removed and he is now suffering from profound orthostatic hypotension. He is also on midodrine and uses compression stockings.  He presents to clinic for recurrent symptoms of dizziness and near syncope. OVS were checked at ALF yesterday and were positive. SBP dropped from 120 to 100 from sitting to standing. He has been referred back to our office for further recommendations.   Per records, it appears that Dr. Sallyanne Kuster advised that his Torsemide only be given based on weight gain (more than 3 lb in 24 hrs). However, it appears that he has been receiving it daily, regardless of weight. He admits that he has had a gradual weight gain since moving there, but he attributes this to the change in his diet, food options at the facility. Over the last year, his weight has leveled off and he denies any significant day to day weight fluctuations. He denies dyspnea. He continues to have mild chronic LEE but has been compliant with compression stockings. He denies CP but has noticed more fatigue with exertion. His EKG today shows Bifascicular block with new TW abnormalities concerning for inferolateral ischemia.    Current Outpatient Prescriptions  Medication Sig Dispense Refill  . ALPRAZolam (XANAX) 0.25 MG tablet Take  0.25 mg by mouth 2 (two) times daily. Take 1 tab bid  0  . amiodarone (PACERONE) 200 MG tablet Take 400 mg by mouth daily.    . AMITIZA 8 MCG capsule Take 8 mcg by mouth 2 (two) times daily.  6  . aspirin EC 81 MG EC tablet Take 1 tablet (81 mg total) by mouth daily.    . Cholecalciferol (VITAMIN D3) 5000 UNITS CAPS Take 1 capsule by mouth every Monday. Reported on 02/22/2015    . clobetasol ointment (TEMOVATE) [id number] % Apply 1 application topically 2 (two) times daily.    . clopidogrel (PLAVIX) 75 MG tablet Take 75 mg by mouth daily.    . colchicine 0.6 MG tablet Take 0.6 mg by mouth daily.    . [employer] GENTLE LAXATIVE 10 MG suppository Place 10 mg rectally as directed. Reported on 02/22/2015 INSERT 1 SUPPOSITORY AS NEEDED EVERY 3 DAYS IF NO B.M. RECTAL 30 DAY(S)  6  . famotidine (PEPCID) 20 MG tablet Take 20 mg by mouth at bedtime. Reported on 02/22/2015    . ipratropium (ATROVENT) 0.06 % nasal spray Place 2 sprays into both nostrils 2 (two) times daily as needed for rhinitis.   5  . levothyroxine (SYNTHROID, LEVOTHROID) 137 MCG tablet Take 137 mcg by mouth daily before breakfast.    . meloxicam (MOBIC) 7.5 MG tablet Take 1 tablet (7.5 mg total) by mouth daily. 15 tablet 0  . midodrine (PROAMATINE) 5 MG tablet Take 1 tablet (5 mg total) by mouth 3 (three) times daily with meals. Take third dose at least 6 hours before bedtime 90 tablet 5  . pantoprazole (PROTONIX) 40 MG tablet TAKE 1 TABLET BY MOUTH TWICE A  DAY 60 tablet 11  . pravastatin (PRAVACHOL) 40 MG tablet Take 40 mg by mouth daily.  3  . rOPINIRole (REQUIP) 0.5 MG tablet Take 0.5-1 mg by mouth every evening.  2  . senna (SENOKOT) 8.6 MG tablet Take one tablet by mouth every other day at bedtime for constipation    . tamsulosin (FLOMAX) 0.4 MG CAPS capsule Take 0.4 mg by mouth daily.    Marland Kitchen torsemide (DEMADEX) 10 MG tablet Take 10 mg by mouth daily. Take one tablet by mouth daily as needed if weight is >175lb    . traZODone (DESYREL) 50 MG tablet  Take 0.5 tablets (25 mg total) by mouth at bedtime as needed for sleep.     No current facility-administered medications for this visit.    Allergies  Allergen Reactions  . Crestor [Rosuvastatin Calcium]     Unknown reaction   . Altace [Ramipril] Other (See Comments)    Cough  . Bactrim [Sulfamethoxazole-Trimethoprim] Rash  . Penicillins Rash    Has patient had a PCN reaction causing immediate rash, facial/tongue/throat swelling, SOB or lightheadedness with hypotension: No Has patient had a PCN reaction causing severe rash involving mucus membranes or skin necrosis: Yes Has patient had a PCN reaction that required hospitalization Yes Has patient had a PCN reaction occurring within the last 10 years: No If all of the above answers are "NO", then may proceed with Cephalosporin use.   . Sulfa Antibiotics Rash    Social History   Social History  . Marital Status: Married    Spouse Name: N/A  . Number of Children: 2  . Years of Education: Masters   Occupational History  . Retired Company secretary -Pensions consultant    Social History Main Topics  . Smoking status: Never Smoker   . Smokeless tobacco: Never Used  . Alcohol Use: No  . Drug Use: No  . Sexual Activity: No   Other Topics Concern  . Not on file   Social History Narrative   Lives at [facility] [place] 01/09/15   Married - Adam [name]   Never smoked   Alcohol none   Exercise         Diet:Low sodium   Do you drink/eat things with caffeine? No   Marital status: Married                              What year were you married?1950   Do you live in a house, apartment, assisted living, condo, trailer, etc)?    Is it one or more stories? 1   How many persons live in your home? 2   Do you have any pets in your home? No   Current or past profession: Minister, [employer]   Do you exercise?     Very Little                                                 Type & how often:    Do you have a living will?  Yes    Do you have a DNR Form? Yes   Do you have a POA/HPOA forms? Yes     Review of Systems: General: negative for chills, fever, night sweats or weight changes.  Cardiovascular: negative for chest pain, dyspnea on exertion, edema,  orthopnea, palpitations, paroxysmal nocturnal dyspnea or shortness of breath Dermatological: negative for rash Respiratory: negative for cough or wheezing Urologic: negative for hematuria Abdominal: negative for nausea, vomiting, diarrhea, bright red blood per rectum, melena, or hematemesis Neurologic: negative for visual changes, syncope, or dizziness All other systems reviewed and are otherwise negative except as noted above.    Blood pressure 112/62, pulse 70, height 5\' 11"  (1.803 m), weight 183 lb 3.2 oz (83.099 kg).  General appearance: alert, cooperative and no distress Neck: no carotid bruit and no JVD Lungs: clear to auscultation bilaterally Heart: irregularly irregular rhythm and 2/6 SM along RSB Extremities: 1+ bilateral chronic LEE, wearing compression stockings Pulses: 2+ and symmetric Skin: warm and dry Neurologic: Grossly normal  EKG EKG today shows Bifascicular block with new TWIs c/w inferolateral ischemia.    ASSESSMENT AND PLAN:   1. Orthostatic Hypotension: BP is currently stable at 112/62, but he has had recent issues with symptomatic orthostatic hypotension at ALF. He has been receiving Torsemide daily, instead or PRN which was initially recommended. We will have to allow him to maintain some degree of hypervolemia as long as he does not have dyspnea and his leg edema does not cause skin ulceration, weeping or blisters which would make him prone to cellulitis. I assessed his lower extremities today. He has mild bilateral LEE but no weeping edema or skin changes. His compression stockings appear to be keeping this stable. He is not short of breath. I have changed order to only give Torsemide PRN based on daily weights or development of  weeping edema or dyspnea. Only give if >3 lb weight gain in 24 hrs. He is not on any other meds that drop BP. He is to continue midodrine and compression stockings.   2. CAD: he denies CP but dose note mid decrease in exercise tolerance. His EKG also shows new TWIs in the inferolateral leads. He has a h/o CABG. His last cath was in 2012 and last NST was in 2014. We will reassess with repeat NST.   3. Chronic Systolic CHF: stable. Torsemide PRN based on weight (See problem # 1).     NILTON, FISCHETTI PA-C 03/30/2015 6:24 PM

## 2015-03-31 ENCOUNTER — Other Ambulatory Visit: Payer: Self-pay | Admitting: *Deleted

## 2015-03-31 ENCOUNTER — Telehealth: Payer: Self-pay | Admitting: *Deleted

## 2015-03-31 ENCOUNTER — Telehealth: Payer: Self-pay | Admitting: Cardiology

## 2015-03-31 MED ORDER — TORSEMIDE 10 MG PO TABS: 10.0000 mg | ORAL_TABLET | Freq: Every day | ORAL | Status: DC

## 2015-03-31 MED ORDER — TORSEMIDE 10 MG PO TABS: ORAL_TABLET | ORAL | Status: DC

## 2015-03-31 NOTE — Telephone Encounter (Signed)
Returned call to [facility] and clarified order. Printed and faxed to provided fax #.

## 2015-03-31 NOTE — Addendum Note (Signed)
Addended by: Gaetano Net on: 03/31/2015 07:23 AM   Modules accepted: Orders

## 2015-03-31 NOTE — Telephone Encounter (Signed)
Per Ellen Henri, PA-C, called and spoke with pts wife re: [place] suggested pt have a lexiscan due to new changes on pt ekg.  Order has been put in and Mrs. Bamber has been advised that one of our schedulers would call them to get it scheduled. Instructions verbally given over phone. Mrs. Sendra verbalized appreciation and understanding.

## 2015-03-31 NOTE — Telephone Encounter (Signed)
New message      Pt was seen yesterday by Brittainy.   Pt c/o medication issue:  1. Name of Medication: torsemide 2. How are you currently taking this medication (dosage and times per day)? 10mg  daily 3. Are you having a reaction (difficulty breathing--STAT)? no 4. What is your medication issue? Prior to yesterday's visit pt was taking torsemide 10mg  daily.  They need a new order to stop torsemide daily and send in a new order to use torsemide only it pt gains ??lbs.  Please fax both orders to [phone]

## 2015-04-05 ENCOUNTER — Telehealth: Payer: Self-pay | Admitting: Internal Medicine

## 2015-04-05 NOTE — Telephone Encounter (Signed)
lmtcb x1 for pt. 

## 2015-04-06 ENCOUNTER — Non-Acute Institutional Stay: Payer: Medicare Other | Admitting: Nurse Practitioner

## 2015-04-06 ENCOUNTER — Encounter: Payer: Self-pay | Admitting: Nurse Practitioner

## 2015-04-06 DIAGNOSIS — G4700 Insomnia, unspecified: Secondary | ICD-10-CM | POA: Diagnosis not present

## 2015-04-06 DIAGNOSIS — G2581 Restless legs syndrome: Secondary | ICD-10-CM | POA: Diagnosis not present

## 2015-04-06 DIAGNOSIS — K219 Gastro-esophageal reflux disease without esophagitis: Secondary | ICD-10-CM

## 2015-04-06 DIAGNOSIS — R350 Frequency of micturition: Secondary | ICD-10-CM

## 2015-04-06 DIAGNOSIS — R319 Hematuria, unspecified: Secondary | ICD-10-CM

## 2015-04-06 DIAGNOSIS — E039 Hypothyroidism, unspecified: Secondary | ICD-10-CM | POA: Diagnosis not present

## 2015-04-06 DIAGNOSIS — I472 Ventricular tachycardia, unspecified: Secondary | ICD-10-CM

## 2015-04-06 DIAGNOSIS — N390 Urinary tract infection, site not specified: Secondary | ICD-10-CM | POA: Insufficient documentation

## 2015-04-06 DIAGNOSIS — M10012 Idiopathic gout, left shoulder: Secondary | ICD-10-CM

## 2015-04-06 DIAGNOSIS — K5900 Constipation, unspecified: Secondary | ICD-10-CM

## 2015-04-06 DIAGNOSIS — I951 Orthostatic hypotension: Secondary | ICD-10-CM | POA: Diagnosis not present

## 2015-04-06 DIAGNOSIS — I5040 Unspecified combined systolic (congestive) and diastolic (congestive) heart failure: Secondary | ICD-10-CM | POA: Diagnosis not present

## 2015-04-06 DIAGNOSIS — F418 Other specified anxiety disorders: Secondary | ICD-10-CM

## 2015-04-06 DIAGNOSIS — M109 Gout, unspecified: Secondary | ICD-10-CM

## 2015-04-06 LAB — BASIC METABOLIC PANEL
BUN: 16 mg/dL (ref 4–21)
Creatinine: 0.9 mg/dL (ref 0.6–1.3)
Glucose: 89 mg/dL
Potassium: 4.6 mmol/L (ref 3.4–5.3)
Sodium: 142 mmol/L (ref 137–147)

## 2015-04-06 LAB — CBC AND DIFFERENTIAL
HCT: 36 % — AB (ref 41–53)
Hemoglobin: 12.1 g/dL — AB (ref 13.5–17.5)
Platelets: 220 10*3/uL (ref 150–399)
WBC: 9.6 10^3/mL

## 2015-04-06 NOTE — Assessment & Plan Note (Signed)
Gross hematuria observed, empirical Cipro 500mg  bid x 7 days started 04/05/15, urine culture pending, update CBC and CMP

## 2015-04-06 NOTE — Assessment & Plan Note (Signed)
Stable, continue Requip 0.5mg  daily.

## 2015-04-06 NOTE — Assessment & Plan Note (Signed)
Heart rate is in control, continue Amiodarone 400mg  daily.

## 2015-04-06 NOTE — Assessment & Plan Note (Signed)
Increased Levothyroxine 135 mcg, TSH 7.70 10/24/14, 9.021 02/16/15, TSH 8 weeks.

## 2015-04-06 NOTE — Telephone Encounter (Signed)
Pt's wife returned call. She states that he is urinating blood and is living in a assisted living. She states that he will not be able to make his appointment tomorrow on 04/07/15 with MW. I explained to her that the ov had already been canceled. Nothing further needed at this time.

## 2015-04-06 NOTE — Progress Notes (Signed)
Patient ID: Adam [name], male   DOB: 29-Dec-1926, 82 y.o.   MRN: [id number]  Location:  [place] Room Number: [id number] Place of Service: [place] Provider:  Lennie Odor Antha Niday NP  Horatio Pel, MD  Patient Care Team: Deland Pretty, MD as PCP - General (Internal Medicine) [name], MD as Attending Physician (Cardiology) Vevelyn Royals, MD as Consulting Physician (Ophthalmology) Franchot Gallo, MD as Consulting Physician (Urology) Tanda Rockers, MD as Consulting Physician (Pulmonary Disease) Allyn Kenner, MD (Dermatology) Deliah Goody, PA-C as Physician Assistant (Physician Assistant)  Extended Emergency Contact Information Primary Emergency Contact: Hilmer,[name] Address: [place], [place] Phone: [phone] Mobile Phone: [phone] Relation: Spouse Secondary Emergency Contact: [name],Jia  [place] Phone: [phone] Mobile Phone: [phone] Relation: Daughter  Code Status:  DNR Goals of care: Advanced Directive information Advanced Directives 04/06/2015  Does patient have an advance directive? Yes  Type of Advance Directive Living will;Healthcare Power of Attorney  Does patient want to make changes to advanced directive? No - Patient declined  Copy of advanced directive(s) in chart? Yes     Chief Complaint  Patient presents with  . Acute Visit    Hematuria    HPI:  Pt is a 82 y.o. male seen today for medical evaluation of hematuria, urinary frequency, dysuria, urine culture pending, he is afebrile, denied abd or CVA tenderness. No noted nausea or vomiting.    Hx of Levothyroxine 141mcg, TSH trending up to 9.021 02/21/15.  Hx of edema, insomnia, Afib, heart rate is in controlled, taking Amiodarone 400mg  daily,  Gout, stable, taking Colchicine 0.6mg  daily,  osteoarthritis, pain is managed with Mobic 7.5mg  daily, GERD, stable while on Pantoprazole 40mg  and Famotidine  20mg  daily, constipation, orthostatic hypotension is managed with Midodrine 5mg  tid, restless leg is managed with Requip 0.5mg  nightly, chronic urinary frequency while on Tamsulosin 0.4mg , taking Amitiza 8mg  bid and Senokot S I qod for constipation, prn Torsemide for weight gain, prn Trazodone requested nightly for sleep.    Past Medical History  Diagnosis Date  . CAD (coronary artery disease)     CABG (LIMA-LAD, SVG-RCA, SVG-OM in 1996).  07/2009 BMS to SVG-RCA. Cath in 04/2010 with patent stents   . Bradycardia     AFib/SSS s/p St Jude PPM 04/12/2008  . Hypothyroid   . Pacemaker   . Hyperlipidemia   . GERD (gastroesophageal reflux disease)   . Hiatal hernia   . Insomnia   . S/P CABG x 4   . RBBB   . Melanoma of back ([place]) 1976  . Heart murmur     "just told I had one today" (12/15/2012)  . Myocardial infarction [facility]) 1996; 2011    "both silent" (12/15/2012)  . Exertional shortness of breath     "sometimes walking" (12/15/2012)  . Arthritis     "minor, back and sometimes knees" (12/15/2012)  . Sustained ventricular tachycardia ([place]) 07/27/2014  . Hypertension   . [facility] spotted fever   . CHF (congestive heart failure) ([place])   . Osteoporosis, senile    Past Surgical History  Procedure Laterality Date  . US echocardiography  07/11/2009    EF 45-50%  . Nm myoview ltd  06/2011    low risk  . Coronary artery bypass graft  1996    LIMA to LAD,SVG to RCA & SVG to OM  . Tonsillectomy  1938  .  Coronary angioplasty with stent placement  07/2009    bare metal stent to SVG to the RCA  . Cardiac catheterization  04/2010    LIMA to LAD patent,SVG to OM patent,no in-stnet restenosis RCA  . Cataract extraction w/ intraocular lens  implant, bilateral Bilateral 2012  . Melanoma excision  05/1974 X2    "taken off my back" (12/15/2012)  . Transurethral resection of prostate  1986  . Insert / replace / remove pacemaker  2010    Allergies  Allergen Reactions  . Crestor [Rosuvastatin  Calcium]     Unknown reaction   . Altace [Ramipril] Other (See Comments)    Cough  . Bactrim [Sulfamethoxazole-Trimethoprim] Rash  . Penicillins Rash    Has patient had a PCN reaction causing immediate rash, facial/tongue/throat swelling, SOB or lightheadedness with hypotension: No Has patient had a PCN reaction causing severe rash involving mucus membranes or skin necrosis: Yes Has patient had a PCN reaction that required hospitalization Yes Has patient had a PCN reaction occurring within the last 10 years: No If all of the above answers are "NO", then may proceed with Cephalosporin use.   . Sulfa Antibiotics Rash      Medication List       This list is accurate as of: 04/06/15 11:59 PM.  Always use your most recent med list.               acetaminophen 325 MG tablet  Commonly known as:  TYLENOL  Take 650 mg by mouth every 6 (six) hours as needed for mild pain or moderate pain.     amiodarone 200 MG tablet  Commonly known as:  PACERONE  Take 400 mg by mouth daily.     AMITIZA 8 MCG capsule  Generic drug:  lubiprostone  Take 8 mcg by mouth 2 (two) times daily.     aspirin 81 MG EC tablet  Take 1 tablet (81 mg total) by mouth daily.     ciprofloxacin 500 MG tablet  Commonly known as:  CIPRO  Take 500 mg by mouth 2 times daily at 12 noon and 4 pm.     clobetasol ointment 0.05 %  Commonly known as:  TEMOVATE  Apply 1 application topically 2 (two) times daily.     clopidogrel 75 MG tablet  Commonly known as:  PLAVIX  Take 75 mg by mouth daily.     colchicine 0.6 MG tablet  Take 0.6 mg by mouth daily.     bisacodyl 5 MG EC tablet  Commonly known as:  DULCOLAX  Take 5 mg by mouth daily as needed for moderate constipation.     CVS GENTLE LAXATIVE 10 MG suppository  Generic drug:  bisacodyl  Place 10 mg rectally as directed. Reported on 02/22/2015 INSERT 1 SUPPOSITORY AS NEEDED EVERY 3 DAYS IF NO B.M. RECTAL 30 DAY(S)     famotidine 20 MG tablet  Commonly known  as:  PEPCID  Take 20 mg by mouth at bedtime. Reported on 02/22/2015     ipratropium 0.06 % nasal spray  Commonly known as:  ATROVENT  Place 2 sprays into both nostrils 2 (two) times daily as needed for rhinitis.     levothyroxine 137 MCG tablet  Commonly known as:  SYNTHROID, LEVOTHROID  Take 137 mcg by mouth daily before breakfast.     magnesium citrate Soln  Take 0.5 Bottles by mouth as needed for mild constipation, moderate constipation or severe constipation.     meloxicam 7.5 MG  tablet  Commonly known as:  MOBIC  Take 1 tablet (7.5 mg total) by mouth daily.     midodrine 5 MG tablet  Commonly known as:  PROAMATINE  Take 1 tablet (5 mg total) by mouth 3 (three) times daily with meals. Take third dose at least 6 hours before bedtime     pantoprazole 40 MG tablet  Commonly known as:  PROTONIX  TAKE 1 TABLET BY MOUTH TWICE A DAY     pravastatin 40 MG tablet  Commonly known as:  PRAVACHOL  Take 40 mg by mouth daily.     rOPINIRole 0.5 MG tablet  Commonly known as:  REQUIP  Take 0.5-1 mg by mouth every evening.     saccharomyces boulardii 250 MG capsule  Commonly known as:  FLORASTOR  Take 250 mg by mouth 2 (two) times daily.     senna 8.6 MG tablet  Commonly known as:  SENOKOT  Take one tablet by mouth every other day at bedtime for constipation     tamsulosin 0.4 MG Caps capsule  Commonly known as:  FLOMAX  Take 0.4 mg by mouth daily.     torsemide 10 MG tablet  Commonly known as:  DEMADEX  TAKE ONE TABLET BY MOUTH DAILY AS NEEDED, ONLY IF PT HAS WEIGHT GAIN OF 3 LBS OR MORE IN A 24 HOUR PERIOD     traZODone 50 MG tablet  Commonly known as:  DESYREL  Take 0.5 tablets (25 mg total) by mouth at bedtime as needed for sleep.     Vitamin D3 5000 units Caps  Take 1 capsule by mouth every Monday. Reported on 02/22/2015        Review of Systems  HENT: Positive for hearing loss and tinnitus.   Eyes: Negative for photophobia and pain.  Respiratory: Negative for  cough and shortness of breath.   Cardiovascular: Positive for leg swelling. Negative for chest pain.       1+. Chronic, compression hosiery.   Gastrointestinal: Negative for nausea, vomiting, abdominal pain, diarrhea and constipation.  Genitourinary: Positive for frequency and enuresis. Negative for hematuria.       Gross hematuria x 2 days.   Musculoskeletal: Negative for myalgias, back pain and neck pain.       Ambulates with walker.   Skin: Negative for rash.       Coccyx pressure ulcer healing nicely  Neurological: Positive for dizziness and weakness. Negative for tremors.       Insomnia, restless legs.   Patient reports that he has H/O dizziness but has been worse in past 4 months. This makes the patient feel unbalanced.   R hand numbness.   Psychiatric/Behavioral: Negative for suicidal ideas and hallucinations. The patient is not nervous/anxious.        Not enough sleep, decreased energy     Immunization History  Administered Date(s) Administered  . Influenza-Unspecified 10/05/2012, 12/05/2013, 11/05/2014  . Pneumococcal-Unspecified 10/05/2009   Pertinent  Health Maintenance Due  Topic Date Due  . PNA vac Low Risk Adult (2 of 2 - PCV13) 10/06/2010  . INFLUENZA VACCINE  09/05/2015  . DEXA SCAN  12/14/2024   Fall Risk  02/02/2015  Falls in the past year? No   Functional Status Survey:    Filed Vitals:   04/06/15 1018  BP: 120/70  Pulse: 68  Temp: 99.1 F (37.3 C)  TempSrc: Oral  Resp: 20  Height: 5\' 11"  (1.803 m)  Weight: 181 lb 9.6 oz (82.373 kg)  Body mass index is 25.34 kg/(m^2). Physical Exam  Constitutional: He is oriented to person, place, and time. He appears well-developed and well-nourished.  HENT:  Head: Normocephalic and atraumatic.  Right Ear: External ear normal.  Left Ear: External ear normal.  Eyes: Conjunctivae and EOM are normal. Pupils are equal, round, and reactive to light. Right eye exhibits no discharge. Left eye exhibits no discharge.   Neck: Normal range of motion. Neck supple. No JVD present. No tracheal deviation present. No thyromegaly present.  Cardiovascular:  Murmur heard. [id number] systolic  Pulmonary/Chest: Effort normal and breath sounds normal. No respiratory distress. He has no wheezes. He has no rales.  Abdominal: He exhibits no distension. There is no tenderness.  abd hernia  Genitourinary: No penile tenderness.  Gross hematuria x 2 days.   Musculoskeletal: Normal range of motion. He exhibits edema. He exhibits no tenderness.  1+ edema BLE  Neurological: He is alert and oriented to person, place, and time. He has normal reflexes. He displays normal reflexes. No cranial nerve deficit. He exhibits normal muscle tone. Coordination normal.  Skin: Skin is warm and dry. No rash noted. No erythema.  Healing pressure ulcer coccyx  Psychiatric: He has a normal mood and affect. His behavior is normal. Judgment and thought content normal.    Labs reviewed:  Recent Labs  07/26/14 1359  12/06/14 1226 12/29/14 1455 12/30/14 0524 02/16/15  NA 140  < > 137 137 138 139  K 4.8  < > 4.2 4.3 4.2 4.3  CL 102  < > 105 104 105  --   CO2 28  < > 28 27 27   --   GLUCOSE 83  < > 103* 123* 132*  --   BUN 17  < > 15 20 15 19   CREATININE 0.81  < > 0.61 0.98 0.83 0.9  CALCIUM 9.3  < > 8.4* 9.1 8.7*  --   MG 2.4  --   --   --   --   --   < > = values in this interval not displayed.  Recent Labs  12/03/14 1238 12/06/14 1226 12/29/14 1455 02/16/15  AST 32 19 27 19   ALT 32 20 29 25   ALKPHOS 70 61 65 59  BILITOT 0.6 0.5 0.6  --   PROT 7.1 6.6 7.0  --   ALBUMIN 3.6 3.2* 3.9  --     Recent Labs  07/14/14 1725  12/06/14 1226 12/29/14 1455 12/30/14 0524 02/16/15  WBC 7.4  < > 7.0 13.4* 10.5 5.7  NEUTROABS 4.9  --  5.1 11.2*  --   --   HGB 14.5  < > 12.8* 14.3 12.9* 13.2*  HCT 43.7  < > 38.9* 42.3 39.2 40*  MCV 96.0  < > 98.2 99.1 99.7  --   PLT 298.0  < > 204 208 194 231  < > = values in this interval not  displayed. Lab Results  Component Value Date   TSH 9.02* 02/16/2015   Lab Results  Component Value Date   HGBA1C 5.8 02/16/2015   Lab Results  Component Value Date   CHOL  09/19/2009    93        ATP III CLASSIFICATION:  <200     mg/dL   Desirable  200-239  mg/dL   Borderline High  >=240    mg/dL   High          HDL 34* 09/19/2009   LDLCALC  09/19/2009    39  Total Cholesterol/HDL:CHD Risk Coronary Heart Disease Risk Table                     Men   Women  1/2 Average Risk   3.4   3.3  Average Risk       5.0   4.4  2 X Average Risk   9.6   7.1  3 X Average Risk  23.4   11.0        Use the calculated Patient Ratio above and the CHD Risk Table to determine the patient's CHD Risk.        ATP III CLASSIFICATION (LDL):  <100     mg/dL   Optimal  100-129  mg/dL   Near or Above                    Optimal  130-159  mg/dL   Borderline  160-189  mg/dL   High  >190     mg/dL   Very High   TRIG 98 09/19/2009   CHOLHDL 2.7 09/19/2009    Significant Diagnostic Results in last 30 days:  No results found.  Assessment/Plan  Insomnia 03/20/15 Trazodone 50mg  daily  Gout No gouty attacks, continue Colchicine 0.6mg  daily,  Urinary frequency 4-6x/night, urology, continue Tamisulosin 0.4mg  daily.   Combined congestive systolic and diastolic heart failure ([place]) Compensated clinically, continue Torsemide prn, BNP 96.8 02/21/15  Restless leg Stable, continue Requip 0.5mg  daily.   Depression with anxiety Continue Trazodone 50mg   Arterial hypotension Blood pressure is controlled, continue Midodrine. Contributory to dizziness and unsteadiness associated with position change.   Constipation Managed, continue Amitiza, Senna, prn Bisacodyl suppository and po  GERD (gastroesophageal reflux disease) Stable, continue Protonix and Famotidine  Hypothyroidism Increased Levothyroxine 135 mcg, TSH 7.70 10/24/14, 9.021 02/16/15, TSH 8 weeks.  Sustained ventricular  tachycardia Heart rate is in control, continue Amiodarone 400mg  daily.   Hematuria Gross hematuria observed, empirical Cipro 500mg  bid x 7 days started 04/05/15, urine culture pending, update CBC and CMP    Family/ staff Communication: continue to observe the patient.   Labs/tests ordered:  CBC, CMP, pending urine culture

## 2015-04-06 NOTE — Assessment & Plan Note (Signed)
Stable, continue Protonix and Famotidine

## 2015-04-06 NOTE — Assessment & Plan Note (Signed)
4-6x/night, urology, continue Tamisulosin 0.4mg  daily.

## 2015-04-06 NOTE — Assessment & Plan Note (Signed)
Compensated clinically, continue Torsemide prn, BNP 96.8 02/21/15

## 2015-04-06 NOTE — Assessment & Plan Note (Signed)
Managed, continue Amitiza, Senna, prn Bisacodyl suppository and po

## 2015-04-06 NOTE — Assessment & Plan Note (Signed)
Blood pressure is controlled, continue Midodrine. Contributory to dizziness and unsteadiness associated with position change.

## 2015-04-06 NOTE — Assessment & Plan Note (Signed)
03/20/15 Trazodone 50mg  daily

## 2015-04-06 NOTE — Assessment & Plan Note (Signed)
No gouty attacks, continue Colchicine 0.6mg  daily,

## 2015-04-06 NOTE — Telephone Encounter (Signed)
lmtcb x2 for pt. 

## 2015-04-06 NOTE — Assessment & Plan Note (Addendum)
Continue Trazodone 50mg 

## 2015-04-07 ENCOUNTER — Ambulatory Visit: Payer: Medicare Other | Admitting: Internal Medicine

## 2015-04-10 ENCOUNTER — Telehealth (HOSPITAL_COMMUNITY): Payer: Self-pay | Admitting: *Deleted

## 2015-04-10 NOTE — Telephone Encounter (Signed)
Left message on voicemail per DPR in reference to upcoming appointment scheduled on 04/12/15 with detailed instructions given per Myocardial Perfusion Study Information Sheet for the test. LM to arrive 15 minutes early, and that it is imperative to arrive on time for appointment to keep from having the test rescheduled. If you need to cancel or reschedule your appointment, please call the office within 24 hours of your appointment. Failure to do so may result in a cancellation of your appointment, and a $50 no show fee. Phone number given for call back for any questions. Hubbard Robinson, RN

## 2015-04-12 ENCOUNTER — Ambulatory Visit (HOSPITAL_COMMUNITY): Payer: Medicare Other | Attending: Cardiology

## 2015-04-12 DIAGNOSIS — R5383 Other fatigue: Secondary | ICD-10-CM | POA: Diagnosis not present

## 2015-04-12 DIAGNOSIS — R55 Syncope and collapse: Secondary | ICD-10-CM | POA: Insufficient documentation

## 2015-04-12 DIAGNOSIS — I517 Cardiomegaly: Secondary | ICD-10-CM | POA: Diagnosis not present

## 2015-04-12 DIAGNOSIS — R9439 Abnormal result of other cardiovascular function study: Secondary | ICD-10-CM | POA: Diagnosis not present

## 2015-04-12 DIAGNOSIS — I255 Ischemic cardiomyopathy: Secondary | ICD-10-CM

## 2015-04-12 LAB — MYOCARDIAL PERFUSION IMAGING
LV dias vol: 190 mL (ref 62–150)
LV sys vol: 129 mL
Peak HR: 70 {beats}/min
RATE: 0.41
Rest HR: 70 {beats}/min
SDS: 9
SRS: 18
SSS: 25
TID: 1.05

## 2015-04-12 MED ORDER — REGADENOSON 0.4 MG/5ML IV SOLN
0.4000 mg | Freq: Once | INTRAVENOUS | Status: AC
  Administered 2015-04-12: 0.4 mg via INTRAVENOUS

## 2015-04-12 MED ORDER — TECHNETIUM TC 99M SESTAMIBI GENERIC - CARDIOLITE
10.7000 | Freq: Once | INTRAVENOUS | Status: AC | PRN
  Administered 2015-04-12: 11 via INTRAVENOUS

## 2015-04-12 MED ORDER — TECHNETIUM TC 99M SESTAMIBI GENERIC - CARDIOLITE
32.7000 | Freq: Once | INTRAVENOUS | Status: AC | PRN
  Administered 2015-04-12: 32.7 via INTRAVENOUS

## 2015-04-13 ENCOUNTER — Encounter: Payer: Self-pay | Admitting: Nurse Practitioner

## 2015-04-17 ENCOUNTER — Telehealth: Payer: Self-pay | Admitting: Cardiology

## 2015-04-17 ENCOUNTER — Telehealth: Payer: Self-pay | Admitting: Cardiovascular Disease

## 2015-04-17 NOTE — Telephone Encounter (Signed)
Adam [name] is calling to get the results of Stress Test on 04/12/15 .Marland Kitchen Please call..  Thanks

## 2015-04-17 NOTE — Telephone Encounter (Signed)
Results left for previous caller.

## 2015-04-17 NOTE — Telephone Encounter (Signed)
Left detailed message on home answering machine with results.

## 2015-04-17 NOTE — Telephone Encounter (Signed)
New message ° ° ° ° ° °Calling to get stress test results °

## 2015-04-18 ENCOUNTER — Telehealth: Payer: Self-pay | Admitting: Cardiovascular Disease

## 2015-04-18 NOTE — Telephone Encounter (Signed)
Left msg for patient to call. 

## 2015-04-18 NOTE — Telephone Encounter (Signed)
NeW Message  Pt calling about NUC/STRESS restuls./ Please call back and discuss.

## 2015-04-20 LAB — CBC AND DIFFERENTIAL
HCT: 37 % — AB (ref 41–53)
Hemoglobin: 12.1 g/dL — AB (ref 13.5–17.5)
Platelets: 221 10*3/uL (ref 150–399)
WBC: 5.8 10^3/mL

## 2015-04-20 LAB — HEPATIC FUNCTION PANEL
ALT: 38 U/L (ref 10–40)
AST: 33 U/L (ref 14–40)
Alkaline Phosphatase: 54 U/L (ref 25–125)
Bilirubin, Total: 0.3 mg/dL

## 2015-04-20 LAB — BASIC METABOLIC PANEL
BUN: 16 mg/dL (ref 4–21)
Creatinine: 0.9 mg/dL (ref 0.6–1.3)
Glucose: 90 mg/dL
Potassium: 4.6 mmol/L (ref 3.4–5.3)
Sodium: 142 mmol/L (ref 137–147)

## 2015-04-20 LAB — HEMOGLOBIN A1C: Hemoglobin A1C: 5.9

## 2015-04-20 LAB — TSH: TSH: 2.98 u[IU]/mL (ref 0.41–5.90)

## 2015-04-20 NOTE — Telephone Encounter (Signed)
Spoke to patient's wife . Result given . Verbalized understanding She states patient received results.

## 2015-04-27 ENCOUNTER — Non-Acute Institutional Stay: Payer: Medicare Other | Admitting: Nurse Practitioner

## 2015-04-27 ENCOUNTER — Encounter: Payer: Self-pay | Admitting: Nurse Practitioner

## 2015-04-27 VITALS — BP 130/68 | HR 72 | Temp 97.5°F | Resp 20 | Ht 71.0 in | Wt 191.0 lb

## 2015-04-27 DIAGNOSIS — I5040 Unspecified combined systolic (congestive) and diastolic (congestive) heart failure: Secondary | ICD-10-CM

## 2015-04-27 DIAGNOSIS — K219 Gastro-esophageal reflux disease without esophagitis: Secondary | ICD-10-CM | POA: Diagnosis not present

## 2015-04-27 DIAGNOSIS — R350 Frequency of micturition: Secondary | ICD-10-CM | POA: Diagnosis not present

## 2015-04-27 DIAGNOSIS — N390 Urinary tract infection, site not specified: Secondary | ICD-10-CM | POA: Diagnosis not present

## 2015-04-27 DIAGNOSIS — M1000 Idiopathic gout, unspecified site: Secondary | ICD-10-CM

## 2015-04-27 DIAGNOSIS — R42 Dizziness and giddiness: Secondary | ICD-10-CM

## 2015-04-27 DIAGNOSIS — E876 Hypokalemia: Secondary | ICD-10-CM | POA: Diagnosis not present

## 2015-04-27 DIAGNOSIS — E039 Hypothyroidism, unspecified: Secondary | ICD-10-CM | POA: Diagnosis not present

## 2015-04-27 DIAGNOSIS — F418 Other specified anxiety disorders: Secondary | ICD-10-CM | POA: Diagnosis not present

## 2015-04-27 DIAGNOSIS — K5900 Constipation, unspecified: Secondary | ICD-10-CM | POA: Diagnosis not present

## 2015-04-27 DIAGNOSIS — G903 Multi-system degeneration of the autonomic nervous system: Secondary | ICD-10-CM

## 2015-04-27 DIAGNOSIS — G4700 Insomnia, unspecified: Secondary | ICD-10-CM

## 2015-04-27 DIAGNOSIS — G2581 Restless legs syndrome: Secondary | ICD-10-CM

## 2015-04-27 NOTE — Assessment & Plan Note (Signed)
Stable, continue Protonix and Famotidine

## 2015-04-27 NOTE — Assessment & Plan Note (Signed)
Gross hematuria, 04/07/15 urine culture E. Coli >100,000, Cipro bid x 7 days started 04/05/15, no change in his c/o dizziness.

## 2015-04-27 NOTE — Progress Notes (Signed)
Patient ID: Adam [name], male   DOB: 1926-10-06, 82 y.o.   MRN: [id number]  Location:  [facility] of Service: clinic Provider:  Lennie Odor Sharline Lehane NP  Horatio Pel, MD  Patient Care Team: Deland Pretty, MD as PCP - General (Internal Medicine) [name], MD as Attending Physician (Cardiology) Vevelyn Royals, MD as Consulting Physician (Ophthalmology) Franchot Gallo, MD as Consulting Physician (Urology) Tanda Rockers, MD as Consulting Physician (Pulmonary Disease) Allyn Kenner, MD (Dermatology) Deliah Goody, PA-C as Physician Assistant (Physician Assistant)  Extended Emergency Contact Information Primary Emergency Contact: Daise,[name] Address: [place], [place] Phone: [phone] Mobile Phone: [phone] Relation: Spouse Secondary Emergency Contact: [name],Savas  [place] Phone: [phone] Mobile Phone: [phone] Relation: Daughter  Code Status:  DNR Goals of care: Advanced Directive information Advanced Directives 04/27/2015  Does patient have an advance directive? Yes  Type of Advance Directive Living will  Does patient want to make changes to advanced directive? No - Patient declined  Copy of advanced directive(s) in chart? Yes     Chief Complaint  Patient presents with  . Medical Management of Chronic Issues    dizziness, bowel issues    HPI:  Pt is a 82 y.o. male seen today for medical evaluation of hematuria, urinary frequency, dysuria, urine culture pending, he is afebrile, denied abd or CVA tenderness. No noted nausea or vomiting.    Hx of Levothyroxine 139mcg, TSH trending up to 9.021 02/21/15.  Hx of edema, insomnia, Afib, heart rate is in controlled, taking Amiodarone 400mg  daily,  Gout, stable, taking Colchicine 0.6mg  daily,  osteoarthritis, pain is managed with Mobic 7.5mg  daily, GERD, stable while on Pantoprazole 40mg  and Famotidine 20mg  daily,  constipation, orthostatic hypotension is managed with Midodrine 5mg  tid, restless leg is managed with Requip 0.5mg  nightly, chronic urinary frequency while on Tamsulosin 0.4mg , taking Amitiza 8mg  bid and Senokot S I qod for constipation, prn Torsemide for weight gain, prn Trazodone requested nightly for sleep.    Past Medical History  Diagnosis Date  . CAD (coronary artery disease)     CABG (LIMA-LAD, SVG-RCA, SVG-OM in 1996).  07/2009 BMS to SVG-RCA. Cath in 04/2010 with patent stents   . Bradycardia     AFib/SSS s/p St Jude PPM 04/12/2008  . Hypothyroid   . Pacemaker   . Hyperlipidemia   . GERD (gastroesophageal reflux disease)   . Hiatal hernia   . Insomnia   . S/P CABG x 4   . RBBB   . Melanoma of back ([place]) 1976  . Heart murmur     "just told I had one today" (12/15/2012)  . Myocardial infarction [facility]) 1996; 2011    "both silent" (12/15/2012)  . Exertional shortness of breath     "sometimes walking" (12/15/2012)  . Arthritis     "minor, back and sometimes knees" (12/15/2012)  . Sustained ventricular tachycardia ([place]) 07/27/2014  . Hypertension   . [facility] spotted fever   . CHF (congestive heart failure) ([place])   . Osteoporosis, senile    Past Surgical History  Procedure Laterality Date  . US echocardiography  07/11/2009    EF 45-50%  . Nm myoview ltd  06/2011    low risk  . Coronary artery bypass graft  1996    LIMA to LAD,SVG to RCA & SVG to OM  . Tonsillectomy  1938  . Coronary angioplasty  with stent placement  07/2009    bare metal stent to SVG to the RCA  . Cardiac catheterization  04/2010    LIMA to LAD patent,SVG to OM patent,no in-stnet restenosis RCA  . Cataract extraction w/ intraocular lens  implant, bilateral Bilateral 2012  . Melanoma excision  05/1974 X2    "taken off my back" (12/15/2012)  . Transurethral resection of prostate  1986  . Insert / replace / remove pacemaker  2010    Allergies  Allergen Reactions  . Crestor [Rosuvastatin Calcium]      Unknown reaction   . Altace [Ramipril] Other (See Comments)    Cough  . Bactrim [Sulfamethoxazole-Trimethoprim] Rash  . Penicillins Rash    Has patient had a PCN reaction causing immediate rash, facial/tongue/throat swelling, SOB or lightheadedness with hypotension: No Has patient had a PCN reaction causing severe rash involving mucus membranes or skin necrosis: Yes Has patient had a PCN reaction that required hospitalization Yes Has patient had a PCN reaction occurring within the last 10 years: No If all of the above answers are "NO", then may proceed with Cephalosporin use.   . Sulfa Antibiotics Rash      Medication List       This list is accurate as of: 04/27/15  4:07 PM.  Always use your most recent med list.               acetaminophen 325 MG tablet  Commonly known as:  TYLENOL  Take 650 mg by mouth every 6 (six) hours as needed for mild pain or moderate pain.     amiodarone 200 MG tablet  Commonly known as:  PACERONE  Take 400 mg by mouth daily.     AMITIZA 8 MCG capsule  Generic drug:  lubiprostone  Take 8 mcg by mouth 2 (two) times daily.     aspirin 81 MG EC tablet  Take 1 tablet (81 mg total) by mouth daily.     clopidogrel 75 MG tablet  Commonly known as:  PLAVIX  Take 75 mg by mouth daily.     colchicine 0.6 MG tablet  Take 0.6 mg by mouth daily.     bisacodyl 5 MG EC tablet  Commonly known as:  DULCOLAX  Take 5 mg by mouth daily as needed for moderate constipation.     [employer] GENTLE LAXATIVE 10 MG suppository  Generic drug:  bisacodyl  Place 10 mg rectally as directed. Reported on 02/22/2015 INSERT 1 SUPPOSITORY AS NEEDED EVERY 3 DAYS IF NO B.M. RECTAL 30 DAY(S)     famotidine 20 MG tablet  Commonly known as:  PEPCID  Take 20 mg by mouth at bedtime. Reported on 02/22/2015     ipratropium 0.06 % nasal spray  Commonly known as:  ATROVENT  Place 2 sprays into both nostrils 2 (two) times daily as needed for rhinitis.     levothyroxine 137 MCG tablet    Commonly known as:  SYNTHROID, LEVOTHROID  Take 137 mcg by mouth daily before breakfast.     magnesium citrate Soln  Take 0.5 Bottles by mouth as needed for mild constipation, moderate constipation or severe constipation.     meloxicam 7.5 MG tablet  Commonly known as:  MOBIC  Take 1 tablet (7.5 mg total) by mouth daily.     midodrine 5 MG tablet  Commonly known as:  PROAMATINE  Take 1 tablet (5 mg total) by mouth 3 (three) times daily with meals. Take third dose at least 6  hours before bedtime     pantoprazole 40 MG tablet  Commonly known as:  PROTONIX  TAKE 1 TABLET BY MOUTH TWICE A DAY     pravastatin 40 MG tablet  Commonly known as:  PRAVACHOL  Take 40 mg by mouth daily.     rOPINIRole 0.5 MG tablet  Commonly known as:  REQUIP  Take 0.5-1 mg by mouth every evening.     senna 8.6 MG tablet  Commonly known as:  SENOKOT  Take one tablet by mouth every other day at bedtime for constipation     tamsulosin 0.4 MG Caps capsule  Commonly known as:  FLOMAX  Take 0.4 mg by mouth daily.     Vitamin D3 5000 units Caps  Take 1 capsule by mouth every Monday. Reported on 02/22/2015        Review of Systems  HENT: Positive for hearing loss and tinnitus.   Eyes: Negative for photophobia and pain.  Respiratory: Negative for cough and shortness of breath.   Cardiovascular: Positive for leg swelling. Negative for chest pain.       1+. Chronic, compression hosiery.   Gastrointestinal: Positive for constipation. Negative for nausea, vomiting, abdominal pain and diarrhea.  Genitourinary: Positive for frequency and enuresis. Negative for hematuria.       Gross hematuria x 2 days.   Musculoskeletal: Negative for myalgias, back pain and neck pain.       Ambulates with walker.   Skin: Negative for rash.       Coccyx pressure ulcer healing nicely  Neurological: Positive for dizziness and weakness. Negative for tremors.       Insomnia, restless legs.   Patient reports that he has H/O  dizziness but has been worse in past 4 months. This makes the patient feel unbalanced.   R hand numbness.   Psychiatric/Behavioral: Negative for suicidal ideas and hallucinations. The patient is not nervous/anxious.        Not enough sleep, decreased energy     Immunization History  Administered Date(s) Administered  . Influenza-Unspecified 10/05/2012, 12/05/2013, 11/05/2014  . Pneumococcal-Unspecified 10/05/2009   Pertinent  Health Maintenance Due  Topic Date Due  . PNA vac Low Risk Adult (2 of 2 - PCV13) 10/06/2010  . INFLUENZA VACCINE  09/05/2015  . DEXA SCAN  12/14/2024   Fall Risk  02/02/2015  Falls in the past year? No   Functional Status Survey:    Filed Vitals:   04/27/15 1523  BP: 130/68  Pulse: 72  Temp: 97.5 F (36.4 C)  TempSrc: Oral  Resp: 20  Height: 5\' 11"  (1.803 m)  Weight: 191 lb (86.637 kg)  SpO2: 97%   Body mass index is 26.65 kg/(m^2). Physical Exam  Constitutional: He is oriented to person, place, and time. He appears well-developed and well-nourished.  HENT:  Head: Normocephalic and atraumatic.  Right Ear: External ear normal.  Left Ear: External ear normal.  Eyes: Conjunctivae and EOM are normal. Pupils are equal, round, and reactive to light. Right eye exhibits no discharge. Left eye exhibits no discharge.  Neck: Normal range of motion. Neck supple. No JVD present. No tracheal deviation present. No thyromegaly present.  Cardiovascular:  Murmur heard. 99991111 systolic  Pulmonary/Chest: Effort normal and breath sounds normal. No respiratory distress. He has no wheezes. He has no rales.  Abdominal: He exhibits no distension. There is no tenderness.  abd hernia  Genitourinary: No penile tenderness.  Gross hematuria x 2 days.   Musculoskeletal: Normal range of motion.  He exhibits edema. He exhibits no tenderness.  1+ edema BLE  Neurological: He is alert and oriented to person, place, and time. He has normal reflexes. No cranial nerve deficit. He  exhibits normal muscle tone. Coordination normal.  Skin: Skin is warm and dry. No rash noted. No erythema.  Healing pressure ulcer coccyx  Psychiatric: He has a normal mood and affect. His behavior is normal. Judgment and thought content normal.    Labs reviewed:  Recent Labs  07/26/14 1359  12/06/14 1226 12/29/14 1455 12/30/14 0524 02/16/15 04/06/15  NA 140  < > 137 137 138 139 142  K 4.8  < > 4.2 4.3 4.2 4.3 4.6  CL 102  < > 105 104 105  --   --   CO2 28  < > 28 27 27   --   --   GLUCOSE 83  < > 103* 123* 132*  --   --   BUN 17  < > 15 20 15 19 16   CREATININE 0.81  < > 0.61 0.98 0.83 0.9 0.9  CALCIUM 9.3  < > 8.4* 9.1 8.7*  --   --   MG 2.4  --   --   --   --   --   --   < > = values in this interval not displayed.  Recent Labs  12/03/14 1238 12/06/14 1226 12/29/14 1455 02/16/15  AST 32 19 27 19   ALT 32 20 29 25   ALKPHOS 70 61 65 59  BILITOT 0.6 0.5 0.6  --   PROT 7.1 6.6 7.0  --   ALBUMIN 3.6 3.2* 3.9  --     Recent Labs  07/14/14 1725  12/06/14 1226 12/29/14 1455 12/30/14 0524 02/16/15 04/06/15  WBC 7.4  < > 7.0 13.4* 10.5 5.7 9.6  NEUTROABS 4.9  --  5.1 11.2*  --   --   --   HGB 14.5  < > 12.8* 14.3 12.9* 13.2* 12.1*  HCT 43.7  < > 38.9* 42.3 39.2 40* 36*  MCV 96.0  < > 98.2 99.1 99.7  --   --   PLT 298.0  < > 204 208 194 231 220  < > = values in this interval not displayed. Lab Results  Component Value Date   TSH 9.02* 02/16/2015   Lab Results  Component Value Date   HGBA1C 5.8 02/16/2015   Lab Results  Component Value Date   CHOL  09/19/2009    93        ATP III CLASSIFICATION:  <200     mg/dL   Desirable  200-239  mg/dL   Borderline High  >=240    mg/dL   High          HDL 34* 09/19/2009   LDLCALC  09/19/2009    39        Total Cholesterol/HDL:CHD Risk Coronary Heart Disease Risk Table                     Men   Women  1/2 Average Risk   3.4   3.3  Average Risk       5.0   4.4  2 X Average Risk   9.6   7.1  3 X Average Risk  23.4    11.0        Use the calculated Patient Ratio above and the CHD Risk Table to determine the patient's CHD Risk.        ATP  III CLASSIFICATION (LDL):  <100     mg/dL   Optimal  100-129  mg/dL   Near or Above                    Optimal  130-159  mg/dL   Borderline  160-189  mg/dL   High  >190     mg/dL   Very High   TRIG 98 09/19/2009   CHOLHDL 2.7 09/19/2009    Significant Diagnostic Results in last 30 days:  No results found.  Assessment/Plan  Urinary tract infection Gross hematuria, 04/07/15 urine culture E. Coli >100,000, Cipro bid x 7 days started 04/05/15, no change in his c/o dizziness.   Hypothyroidism Continue Levothyroxine 135 mcg, TSH 7.70 10/24/14, 9.021 02/16/15, 04/20/15 2.98  Arterial hypotension Blood pressure is controlled, continue Midodrine. Contributory to dizziness and unsteadiness associated with position change.   Combined congestive systolic and diastolic heart failure (HCC) Compensated clinically, continue Torsemide prn, BNP 96.8 02/21/15. 04/06/15 Na 142, K 4.6, Bun 16, creat 0.89, 04/20/14 BNP 111.7, Na 142, K 4.6, Bun 16, creat 0.9   Constipation Constipated, last BM 4 days ago, continue Amitiza, change Senokot S to II qhs, prn Bisacodyl suppository and po  Depression with anxiety Continue Trazodone 50mg  qhs and prn  GERD (gastroesophageal reflux disease) Stable, continue Protonix and Famotidine   Gout No gouty attacks, continue Colchicine 0.6mg  daily,  Hypokalemia 02/21/15 K 4.3 04/06/15 K 4.6 04/20/15 K 4.6   Insomnia 03/20/15 Trazodone 50mg  daily and daily prn  Restless leg Stable, continue Requip 0.5mg  daily.   Urinary frequency 4-6x/night, urology, continue Tamisulosin 0.4mg  daily.  Dizziness, after diuretic asscoiated with hypotension and responded to fluid bolus Hx of it, CBC, CMP unremarkable 04/06/15, will obtain carotid artery R+L [place]. May repeat CBC and CMP, CT head    Family/ staff Communication: continue to observe the patient.    Labs/tests ordered:  US carotid artery R+L

## 2015-04-27 NOTE — Assessment & Plan Note (Signed)
03/20/15 Trazodone 50mg  daily and daily prn

## 2015-04-27 NOTE — Assessment & Plan Note (Signed)
4-6x/night, urology, continue Tamisulosin 0.4mg  daily.

## 2015-04-27 NOTE — Assessment & Plan Note (Signed)
Blood pressure is controlled, continue Midodrine. Contributory to dizziness and unsteadiness associated with position change.

## 2015-04-27 NOTE — Assessment & Plan Note (Addendum)
Hx of it, CBC, CMP unremarkable 04/06/15, will obtain carotid artery R+L [place]. May repeat CBC and CMP, CT head

## 2015-04-27 NOTE — Assessment & Plan Note (Signed)
02/21/15 K 4.3 04/06/15 K 4.6 04/20/15 K 4.6

## 2015-04-27 NOTE — Assessment & Plan Note (Signed)
Continue Levothyroxine 135 mcg, TSH 7.70 10/24/14, 9.021 02/16/15, 04/20/15 2.98

## 2015-04-27 NOTE — Assessment & Plan Note (Signed)
Constipated, last BM 4 days ago, continue Amitiza, change Senokot S to II qhs, prn Bisacodyl suppository and po

## 2015-04-27 NOTE — Assessment & Plan Note (Signed)
No gouty attacks, continue Colchicine 0.6mg  daily,

## 2015-04-27 NOTE — Assessment & Plan Note (Signed)
Continue Trazodone 50mg  qhs and prn

## 2015-04-27 NOTE — Assessment & Plan Note (Signed)
Compensated clinically, continue Torsemide prn, BNP 96.8 02/21/15. 04/06/15 Na 142, K 4.6, Bun 16, creat 0.89, 04/20/14 BNP 111.7, Na 142, K 4.6, Bun 16, creat 0.9

## 2015-04-27 NOTE — Assessment & Plan Note (Signed)
Stable, continue Requip 0.5mg  daily.

## 2015-05-03 ENCOUNTER — Telehealth: Payer: Self-pay

## 2015-05-03 NOTE — Telephone Encounter (Signed)
Patient was seen on 04-27-15. Patient was told he would be set up for a Carotid U/S. Patient's daughter was calling to get a status update.   I reviewed note and seen that Carotid U/S was mentioned. I pended the order for Carotid U/S for it was never placed. Please complete order and sign .

## 2015-05-04 NOTE — Telephone Encounter (Signed)
Spoke with daughter Caryl Asp, about Carotid US. ManXie had it done at [facility] so he wouldn't have to go out. Done 04/08/15 US Carotid R & L normal bil arterial velocities. Daughter was very please.

## 2015-05-04 NOTE — Telephone Encounter (Signed)
[place] please have ManX address this concern for the patient. Order is pending and needs to be completed by the provider

## 2015-05-10 ENCOUNTER — Encounter: Payer: Self-pay | Admitting: Cardiovascular Disease

## 2015-05-10 ENCOUNTER — Ambulatory Visit (INDEPENDENT_AMBULATORY_CARE_PROVIDER_SITE_OTHER): Payer: Medicare Other | Admitting: Cardiovascular Disease

## 2015-05-10 VITALS — BP 123/72 | HR 69 | Ht 70.0 in | Wt 186.0 lb

## 2015-05-10 DIAGNOSIS — I495 Sick sinus syndrome: Secondary | ICD-10-CM

## 2015-05-10 DIAGNOSIS — I472 Ventricular tachycardia, unspecified: Secondary | ICD-10-CM

## 2015-05-10 DIAGNOSIS — I5040 Unspecified combined systolic (congestive) and diastolic (congestive) heart failure: Secondary | ICD-10-CM | POA: Diagnosis not present

## 2015-05-10 DIAGNOSIS — G903 Multi-system degeneration of the autonomic nervous system: Secondary | ICD-10-CM

## 2015-05-10 DIAGNOSIS — I25118 Atherosclerotic heart disease of native coronary artery with other forms of angina pectoris: Secondary | ICD-10-CM

## 2015-05-10 DIAGNOSIS — Z950 Presence of cardiac pacemaker: Secondary | ICD-10-CM

## 2015-05-10 NOTE — Progress Notes (Signed)
Patient ID: Adam [name], male   DOB: 1926/10/16, 82 y.o.   MRN: [id number]    Cardiology Office Note    Date:  05/10/2015   ID:  Adam [name], DOB [birth year], MRN [id number]  PCP:  Adam Pel, MD  Cardiologist:   Adam [name], MD   No chief complaint on file.   History of Present Illness:  Adam [name] is a 82 y.o. male with a long-standing history of coronary artery disease and previous bypass surgery with an extensive scar from inferior wall myocardial infarction, ischemic cardiomyopathy with combined systolic and diastolic heart failure, history of sustained symptomatic ventricular tachycardia (probably "scar VT"), paroxysmal atrial fibrillation, sinus node dysfunction with dual chamber permanent pacemaker.   Over the last few years his biggest problem has been severe orthostatic hypotension that is felt to be neurogenic in etiology. This has led to gradual reduction in all heart failure medications. At this point he is no longer taking any ACE inhibitor as her beta blockers. He remains on amiodarone for suppression of ventricular tachycardia and takes diuretics for heart failure. It has been a challenge to maintain volume status that prevents heart failure without worsening symptomatic orthostatic hypotension. He has had numerous falls due to low blood pressure. He is wearing compression stockings. Fludrocortisone leads to heart failure worsening. He is currently taking ProAmatine.  Overall he feels better. He has less dizziness on a daily basis, but does have episodes roughly once a week when his dizziness is extremely severe and he has to lie down to avoid passing out. He does not have dyspnea, but does complain of difficulty sleeping at night. I'm not sure whether or not he is describing orthopnea. He only sleeps with a slightly elevated head of the bed. He does describe having to get up to urinate 10 times between 4 AM and 6 AM. He believes he urinates a full bladder. When he  was seen by his urologist earlier this year he had a bladder residual of 600 mL. Medications have been adjusted and according to Adam [name] at his last evaluation, bladder function had improved significantly.  He has CAD and is currently free of angina pectoris. He underwent bypass surgery 1996. He had placement of a stent to the SVG to RCA in 2011. His last cardiac catheterization in 2012 show that vessel to be widely patent but all his native coronary arteries are occluded and he is graft dependent. He is nuclear stress test in December 2015 showed an extensive inferior wall scar without any areas of ischemia. in June 2016 he presented with rapid palpitations associated with near-syncope, dyspnea and extreme fatigue , but did not lose consciousness. Interrogation of his pacemaker showed relatively slow ventricular tachycardia at around 150 bpm that appear to have a regular, monomorphic electrogram. His dose of amiodarone was increased and he has not had ventricular tachycardia since. He was evaluated by Dr. Caryl [name]. After some debate, we decided that a conservative approach is most appropriate in view of his declining overall functional status and advanced age. Unfortunately has also been developing progressive dementia, primarily manifested as short-term memory loss. He is now a resident at [facility].  Unfortunately, increasing the dose of amiodarone has led to substantially more ventricular pacing. This may have something to do with his worsening heart failure symptoms.  Current device interrogation: St. Jude accent DR RF device is functioning normally and has an estimated generator longevity of 2.6 years. Lead parameters remain normal. He has 99% atrial pacing  and there does not appear to be any underlying atrial rhythm. He also has 73% ventricular pacing which is a marked increase ever since his amiodarone dose was increased. There have been no episodes of atrial fibrillation and no episodes of  ventricular tachycardia. He is very sedentary and has expected heart rate histogram distribution is very blunted with 99% of heart rates under 80 beats per minutes. In an attempt to reduce the frequency of ventricular pacing, increased his of VIP AV delay extension 270 ms.   Past Medical History  Diagnosis Date  . CAD (coronary artery disease)     CABG (LIMA-LAD, SVG-RCA, SVG-OM in 1996).  07/2009 BMS to SVG-RCA. Cath in 04/2010 with patent stents   . Bradycardia     AFib/SSS s/p St Jude PPM 04/12/2008  . Hypothyroid   . Pacemaker   . Hyperlipidemia   . GERD (gastroesophageal reflux disease)   . Hiatal hernia   . Insomnia   . S/P CABG x 4   . RBBB   . Melanoma of back ([place]) 1976  . Heart murmur     "just told I had one today" (12/15/2012)  . Myocardial infarction [facility]) 1996; 2011    "both silent" (12/15/2012)  . Exertional shortness of breath     "sometimes walking" (12/15/2012)  . Arthritis     "minor, back and sometimes knees" (12/15/2012)  . Sustained ventricular tachycardia ([place]) 07/27/2014  . Hypertension   . [facility] spotted fever   . CHF (congestive heart failure) ([place])   . Osteoporosis, senile     Past Surgical History  Procedure Laterality Date  . US echocardiography  07/11/2009    EF 45-50%  . Nm myoview ltd  06/2011    low risk  . Coronary artery bypass graft  1996    LIMA to LAD,SVG to RCA & SVG to OM  . Tonsillectomy  1938  . Coronary angioplasty with stent placement  07/2009    bare metal stent to SVG to the RCA  . Cardiac catheterization  04/2010    LIMA to LAD patent,SVG to OM patent,no in-stnet restenosis RCA  . Cataract extraction w/ intraocular lens  implant, bilateral Bilateral 2012  . Melanoma excision  05/1974 X2    "taken off my back" (12/15/2012)  . Transurethral resection of prostate  1986  . Insert / replace / remove pacemaker  2010    Current Medications: Outpatient Prescriptions Prior to Visit  Medication Sig Dispense Refill  .  acetaminophen (TYLENOL) 325 MG tablet Take 650 mg by mouth every 6 (six) hours as needed for mild pain or moderate pain.    Marland Kitchen amiodarone (PACERONE) 200 MG tablet Take 400 mg by mouth daily.    . AMITIZA 8 MCG capsule Take 8 mcg by mouth 2 (two) times daily.  6  . aspirin EC 81 MG EC tablet Take 1 tablet (81 mg total) by mouth daily.    . bisacodyl (DULCOLAX) 5 MG EC tablet Take 5 mg by mouth daily as needed for moderate constipation.    . Cholecalciferol (VITAMIN D3) 5000 UNITS CAPS Take 1 capsule by mouth every Monday. Reported on 02/22/2015    . clopidogrel (PLAVIX) 75 MG tablet Take 75 mg by mouth daily.    . colchicine 0.6 MG tablet Take 0.6 mg by mouth daily.    . [employer] GENTLE LAXATIVE 10 MG suppository Place 10 mg rectally as directed. Reported on 02/22/2015 INSERT 1 SUPPOSITORY AS NEEDED EVERY 3 DAYS IF NO B.M.  RECTAL 30 DAY(S)  6  . famotidine (PEPCID) 20 MG tablet Take 20 mg by mouth at bedtime. Reported on 02/22/2015    . ipratropium (ATROVENT) 0.06 % nasal spray Place 2 sprays into both nostrils 2 (two) times daily as needed for rhinitis.   5  . levothyroxine (SYNTHROID, LEVOTHROID) 137 MCG tablet Take 137 mcg by mouth daily before breakfast.    . magnesium citrate SOLN Take 0.5 Bottles by mouth as needed for mild constipation, moderate constipation or severe constipation.    . meloxicam (MOBIC) 7.5 MG tablet Take 1 tablet (7.5 mg total) by mouth daily. (Patient taking differently: Take 7.5 mg by mouth daily as needed. ) 15 tablet 0  . midodrine (PROAMATINE) 5 MG tablet Take 1 tablet (5 mg total) by mouth 3 (three) times daily with meals. Take third dose at least 6 hours before bedtime 90 tablet 5  . pantoprazole (PROTONIX) 40 MG tablet TAKE 1 TABLET BY MOUTH TWICE A DAY 60 tablet 11  . pravastatin (PRAVACHOL) 40 MG tablet Take 40 mg by mouth daily.  3  . rOPINIRole (REQUIP) 0.5 MG tablet Take 0.5-1 mg by mouth every evening.  2  . senna (SENOKOT) 8.6 MG tablet Take one tablet by mouth every  other day at bedtime for constipation    . tamsulosin (FLOMAX) 0.4 MG CAPS capsule Take 0.4 mg by mouth daily.     No facility-administered medications prior to visit.     Allergies:   Crestor; Altace; Bactrim; Penicillins; and Sulfa antibiotics   Social History   Social History  . Marital Status: Married    Spouse Name: N/A  . Number of Children: 2  . Years of Education: Masters   Occupational History  . Retired Company secretary -Pensions consultant    Social History Main Topics  . Smoking status: Never Smoker   . Smokeless tobacco: Never Used  . Alcohol Use: No  . Drug Use: No  . Sexual Activity: No   Other Topics Concern  . None   Social History Narrative   Lives at [place] to [place] 01/09/15   Married - Violet   Never smoked   Alcohol none   Exercise         Diet:Low sodium   Do you drink/eat things with caffeine? No   Marital status: Married                              What year were you married?1950   Do you live in a house, apartment, assisted living, condo, trailer, etc)?    Is it one or more stories? 1   How many persons live in your home? 2   Do you have any pets in your home? No   Current or past profession: Minister, [employer]   Do you exercise?     Very Little                                                 Type & how often:    Do you have a living will?  Yes   Do you have a DNR Form? Yes   Do you have a POA/HPOA forms? Yes     Family History:  The patient's family history includes Coronary artery disease in his father and  mother; Diabetes in his father and mother; Heart disease in his brother, father, and mother; Lung cancer in his father.   ROS:   Please see the history of present illness.    ROS All other systems reviewed and are negative.   PHYSICAL EXAM:   VS:  BP 123/72 mmHg  Pulse 69  Ht 5\' 10"  (1.778 m)  Wt 84.369 kg (186 lb)  BMI 26.69 kg/m2   GEN: Well nourished, well developed, in no acute distress HEENT:  normal Neck: 6 cm elevation in JVD, carotid bruits, or masses Cardiac: Paradoxically split second heart sound, RRR; no murmurs, rubs, or gallops, 2+ pitting edema one third up the calves in a symmetrical pattern bilaterally , healthy left subclavian pacemaker site Respiratory:  clear to auscultation bilaterally, normal work of breathing GI: soft, nontender, nondistended, + BS MS: no deformity or atrophy Skin: warm and dry, no rash Neuro:  Alert and Oriented x 3, Strength and sensation are intact Psych: euthymic mood, full affect  Wt Readings from Last 3 Encounters:  05/10/15 84.369 kg (186 lb)  04/27/15 86.637 kg (191 lb)  04/12/15 83.915 kg (185 lb)      Studies/Labs Reviewed:   EKG:  EKG is not ordered today.    Recent Labs: 07/26/2014: Magnesium 2.4 08/11/2014: Pro B Natriuretic peptide (BNP) 57.0 12/03/2014: B Natriuretic Peptide 66.8 04/20/2015: ALT 38; BUN 16; Creatinine 0.9; Hemoglobin 12.1*; Platelets 221; Potassium 4.6; Sodium 142; TSH 2.98   Lipid Panel    Component Value Date/Time   CHOL  09/19/2009 0354    93        ATP III CLASSIFICATION:  <200     mg/dL   Desirable  200-239  mg/dL   Borderline High  >=240    mg/dL   High          TRIG 98 09/19/2009 0354   HDL 34* 09/19/2009 0354   CHOLHDL 2.7 09/19/2009 0354   VLDL 20 09/19/2009 0354   LDLCALC  09/19/2009 0354    39        Total Cholesterol/HDL:CHD Risk Coronary Heart Disease Risk Table                     Men   Women  1/2 Average Risk   3.4   3.3  Average Risk       5.0   4.4  2 X Average Risk   9.6   7.1  3 X Average Risk  23.4   11.0        Use the calculated Patient Ratio above and the CHD Risk Table to determine the patient's CHD Risk.        ATP III CLASSIFICATION (LDL):  <100     mg/dL   Optimal  100-129  mg/dL   Near or Above                    Optimal  130-159  mg/dL   Borderline  160-189  mg/dL   High  >190     mg/dL   Very High     ASSESSMENT:    1. Combined systolic and  diastolic congestive heart failure, unspecified congestive heart failure chronicity ([place])   2. Neurogenic orthostatic hypotension ([facility])   3. Coronary artery disease involving native coronary artery of native heart with other form of angina pectoris ([place])   4. Sick sinus syndrome ([place])   5. Pacemaker   6. Sustained ventricular tachycardia ([place])  PLAN:  In order of problems listed above:  1. CHF: He has evidence of hypervolemia on exam, but primarily signs of right heart failure. I'm not sure that he is describing orthopnea. We discussed the fact that we are not trying to achieve complete relief of edema, since this would likely lead to worsening orthostatic hypotension. He is unable to take either beta blockers or RAAS inhibitors due to severe hypotension. Need to tolerate some degree of edema. I am concerned that the high burden of ventricular pacing may be worsening his heart failure. 2. Orthostatic hypotension: Overall this seems to be better although he still has episodes of severe hypotension about once a week. These improved with lying down. We discussed the appropriate use of ProAmatine. This medication has a short half-life and is probably completely gone in 4-6 hours. It is possible that his episodes of severe dizziness occur as this medication wears off. Also reviewed the fact that the same medication may cause severe supine hypertension and he should never be taken less than 4 hours before lying down. Continue wearing compression stockings 3. CAD: He has occluded native arteries and is graft dependent (LIMA to LAD, SVG to RCA, SVG to OM; status post stent in SVG to RCA 2011, patent by cath March 2012). November 2014 nuclear study shows inferior scar without ischemia, EF 41%. It is possible that there has been interval occlusion of his saphenous vein graft to the right coronary artery in the last 4 years. In the absence of angina worsening ventricular arrhythmia, plan conservative  management 4. SSS: after the increase in amiodarone dose he has not had any detectable atrial function. 5. PPM: We'll try to reduce the prevalence of ventricular pacing. His AV delay extension was taken out by an additional 20 ms. We'll reduce the dose of amiodarone. Otherwise he has normal device function. 6. VT: He has not had ventricular tachycardia in almost a year. Will try to cut back on the amiodarone dose to see if this improves the burden of ventricular pacing. Previous EP evaluation suggested conservative management without ablation. In view of his advanced age and dementia he is not a defibrillator candidate.    Medication Adjustments/Labs and Tests Ordered: Current medicines are reviewed at length with the patient today.  Concerns regarding medicines are outlined above.  Medication changes, Labs and Tests ordered today are listed in the Patient Instructions below. Patient Instructions  Your physician recommends that you continue on your current medications as directed. Please refer to the Current Medication list given to you today.  Dr Sallyanne Kuster recommends that you schedule a follow-up appointment in 3 months with a pacemaker check.  If you need a refill on your cardiac medications before your next appointment, please call your pharmacy.    Mikael Spray, MD  05/10/2015 6:32 PM    [address], [place], [place]  [address] Phone: [phone]; Fax: [phone]

## 2015-05-10 NOTE — Patient Instructions (Signed)
Your physician recommends that you continue on your current medications as directed. Please refer to the Current Medication list given to you today.  Dr Sallyanne Kuster recommends that you schedule a follow-up appointment in 3 months with a pacemaker check.  If you need a refill on your cardiac medications before your next appointment, please call your pharmacy.

## 2015-05-23 ENCOUNTER — Telehealth: Payer: Self-pay | Admitting: Cardiovascular Disease

## 2015-05-23 NOTE — Telephone Encounter (Signed)
Adam [name] is calling because

## 2015-05-23 NOTE — Telephone Encounter (Signed)
Good news. Please call if he gains > 3 lb from today's weight

## 2015-05-23 NOTE — Telephone Encounter (Signed)
Instructions acknowledged. Left msg for [name] to call.

## 2015-05-23 NOTE — Telephone Encounter (Signed)
Spoke to [place], [place] daughter. 1st wanted to report that the patient has been doing very well recently w/ regards to a previous issue of recurrent dizziness. He has not had any return of dizziness in about 2 weeks. Notes general health good, w exception of a UTI which he has been dealing w/ for ~3 weeks. Currently on 2nd round of antibiotics.  She relates episode where pt had sensation of shortness of breath that lasted very briefly, once 3 weeks ago, and once again 2 nights ago. Pt felt like he had to sit up to catch breath. Resolved very quickly (<1 min). No chest pain or pressure. No lingering shortness of breath. No S&S fluid excess. He is currently in assisted living w/o spouse and has staff that round on him and perform assessment. Daughter notes possibly stress related as pt and wife are transitioning fully out of original home to a shared assisted living arrangement. I asked to confirm w staff if he is currently on diuretics and/or other new meds (UTI), which she could not confirm. Advised for now, no action, would ask that she call back if any return of the dyspnea sensation or have staff call to communicate any concerns.

## 2015-06-08 ENCOUNTER — Non-Acute Institutional Stay: Payer: Medicare Other | Admitting: Nurse Practitioner

## 2015-06-08 ENCOUNTER — Encounter: Payer: Self-pay | Admitting: Nurse Practitioner

## 2015-06-08 DIAGNOSIS — I472 Ventricular tachycardia, unspecified: Secondary | ICD-10-CM

## 2015-06-08 DIAGNOSIS — G2581 Restless legs syndrome: Secondary | ICD-10-CM | POA: Diagnosis not present

## 2015-06-08 DIAGNOSIS — R42 Dizziness and giddiness: Secondary | ICD-10-CM | POA: Diagnosis not present

## 2015-06-08 DIAGNOSIS — E039 Hypothyroidism, unspecified: Secondary | ICD-10-CM | POA: Diagnosis not present

## 2015-06-08 DIAGNOSIS — E876 Hypokalemia: Secondary | ICD-10-CM | POA: Diagnosis not present

## 2015-06-08 DIAGNOSIS — I5040 Unspecified combined systolic (congestive) and diastolic (congestive) heart failure: Secondary | ICD-10-CM

## 2015-06-08 DIAGNOSIS — K219 Gastro-esophageal reflux disease without esophagitis: Secondary | ICD-10-CM

## 2015-06-08 DIAGNOSIS — K5900 Constipation, unspecified: Secondary | ICD-10-CM | POA: Diagnosis not present

## 2015-06-08 DIAGNOSIS — M1000 Idiopathic gout, unspecified site: Secondary | ICD-10-CM | POA: Diagnosis not present

## 2015-06-08 DIAGNOSIS — F418 Other specified anxiety disorders: Secondary | ICD-10-CM

## 2015-06-08 DIAGNOSIS — G903 Multi-system degeneration of the autonomic nervous system: Secondary | ICD-10-CM

## 2015-06-08 DIAGNOSIS — G4700 Insomnia, unspecified: Secondary | ICD-10-CM

## 2015-06-08 DIAGNOSIS — R350 Frequency of micturition: Secondary | ICD-10-CM

## 2015-06-08 NOTE — Assessment & Plan Note (Signed)
Stable, continue Trazodone 50mg  daily and Clonazepam 0.5mg  hs.

## 2015-06-08 NOTE — Assessment & Plan Note (Addendum)
Hx of it, improved, CBC, CMP unremarkable 04/06/15, 04/28/15 US carotid R+L normal bilateral arterial velocities.

## 2015-06-08 NOTE — Assessment & Plan Note (Signed)
No gouty attacks, continue Colchicine 0.6mg  daily,

## 2015-06-08 NOTE — Assessment & Plan Note (Signed)
Continue Levothyroxine 135 mcg, TSH 7.70 10/24/14, 9.021 02/16/15, 04/20/15 2.98

## 2015-06-08 NOTE — Assessment & Plan Note (Signed)
Compensated clinically, chronic BLE edema, continue Torsemide prn, BNP 96.8 02/21/15. 04/06/15 Na 142, K 4.6, Bun 16, creat 0.89, 04/20/14 BNP 111.7, Na 142, K 4.6, Bun 16, creat 0.9   

## 2015-06-08 NOTE — Assessment & Plan Note (Signed)
Blood pressure is controlled, continue Midodrine. Contributory to dizziness and unsteadiness associated with position change, improved.

## 2015-06-08 NOTE — Assessment & Plan Note (Signed)
4-6x/night, urology, continue Tamisulosin 0.4mg  daily.

## 2015-06-08 NOTE — Assessment & Plan Note (Signed)
Stable, continue Protonix and Famotidine

## 2015-06-08 NOTE — Assessment & Plan Note (Signed)
Continue Trazodone 50mg  qhs and Clonazepam 0.5mg  qhs.

## 2015-06-08 NOTE — Assessment & Plan Note (Signed)
Stable, continue Requip 0.5mg  daily.

## 2015-06-08 NOTE — Progress Notes (Signed)
Patient ID: Adam [name], male   DOB: [birth year], 82 y.o.   MRN: [id number]  Location:  [facility] Room Number: [id number] Place of Service: [place] Provider:  Lennie Odor Johnson [place] NP  Horatio Pel, MD  Patient Care Team: Deland Pretty, MD as PCP - General (Internal Medicine) [name], MD as Attending Physician (Cardiology) Vevelyn Royals, MD as Consulting Physician (Ophthalmology) Franchot Gallo, MD as Consulting Physician (Urology) Tanda Rockers, MD as Consulting Physician (Pulmonary Disease) Allyn Kenner, MD (Dermatology) Deliah Goody, PA-C as Physician Assistant (Physician Assistant)  Extended Emergency Contact Information Primary Emergency Contact: Darius,[name] Address: [place], [place] Phone: [phone] Mobile Phone: [phone] Relation: Spouse Secondary Emergency Contact: [name],Breiner  [place] Phone: [phone] Mobile Phone: [phone] Relation: Daughter  Code Status:  DNR Goals of care: Advanced Directive information Advanced Directives 06/08/2015  Does patient have an advance directive? Yes  Type of Advance Directive Living will;Healthcare Power of Attorney  Does patient want to make changes to advanced directive? No - Patient declined  Copy of advanced directive(s) in chart? Yes     Chief Complaint  Patient presents with  . Medical Management of Chronic Issues    Routine visit    HPI:  Pt is a 82 y.o. male seen today for medical evaluation of Hx of Levothyroxine 118mcg, TSH trending up to 9.021 02/21/15.  Hx of edema, Afib, heart rate is in controlled, taking Amiodarone 400mg  daily,  Gout, stable, taking Colchicine 0.6mg  daily,  osteoarthritis, pain is managed with Mobic 7.5mg  daily, GERD, stable while on Pantoprazole 40mg  and Famotidine 20mg  daily, constipation, orthostatic hypotension is managed with Midodrine 5mg  tid, restless leg is managed with Requip  0.5mg  nightly, chronic urinary frequency while on Tamsulosin 0.4mg , taking Amitiza 8mg  bid and Senokot S I qod for constipation, prn Torsemide for weight gain, Clonazepam 0.5mg  and Trazodone 50mg   nightly for sleep.    Past Medical History  Diagnosis Date  . CAD (coronary artery disease)     CABG (LIMA-LAD, SVG-RCA, SVG-OM in 1996).  07/2009 BMS to SVG-RCA. Cath in 04/2010 with patent stents   . Bradycardia     AFib/SSS s/p St Jude PPM 04/12/2008  . Hypothyroid   . Pacemaker   . Hyperlipidemia   . GERD (gastroesophageal reflux disease)   . Hiatal hernia   . Insomnia   . S/P CABG x 4   . RBBB   . Melanoma of back ([place]) 1976  . Heart murmur     "just told I had one today" (12/15/2012)  . Myocardial infarction [facility]) 1996; 2011    "both silent" (12/15/2012)  . Exertional shortness of breath     "sometimes walking" (12/15/2012)  . Arthritis     "minor, back and sometimes knees" (12/15/2012)  . Sustained ventricular tachycardia ([place]) 07/27/2014  . Hypertension   . [facility] spotted fever   . CHF (congestive heart failure) ([place])   . Osteoporosis, senile    Past Surgical History  Procedure Laterality Date  . US echocardiography  07/11/2009    EF 45-50%  . Nm myoview ltd  06/2011    low risk  . Coronary artery bypass graft  1996    LIMA to LAD,SVG to RCA & SVG to OM  . Tonsillectomy  1938  . Coronary angioplasty with stent placement  07/2009    bare metal stent to SVG to the  RCA  . Cardiac catheterization  04/2010    LIMA to LAD patent,SVG to OM patent,no in-stnet restenosis RCA  . Cataract extraction w/ intraocular lens  implant, bilateral Bilateral 2012  . Melanoma excision  05/1974 X2    "taken off my back" (12/15/2012)  . Transurethral resection of prostate  1986  . Insert / replace / remove pacemaker  2010    Allergies  Allergen Reactions  . Crestor [Rosuvastatin Calcium]     Unknown reaction   . Altace [Ramipril] Other (See Comments)    Cough  . Bactrim  [Sulfamethoxazole-Trimethoprim] Rash  . Penicillins Rash    Has patient had a PCN reaction causing immediate rash, facial/tongue/throat swelling, SOB or lightheadedness with hypotension: No Has patient had a PCN reaction causing severe rash involving mucus membranes or skin necrosis: Yes Has patient had a PCN reaction that required hospitalization Yes Has patient had a PCN reaction occurring within the last 10 years: No If all of the above answers are "NO", then may proceed with Cephalosporin use.   . Sulfa Antibiotics Rash      Medication List       This list is accurate as of: 06/08/15  1:20 PM.  Always use your most recent med list.               acetaminophen 325 MG tablet  Commonly known as:  TYLENOL  Take 650 mg by mouth every 6 (six) hours as needed for mild pain or moderate pain.     amiodarone 200 MG tablet  Commonly known as:  PACERONE  Take 400 mg by mouth daily.     AMITIZA 8 MCG capsule  Generic drug:  lubiprostone  Take 8 mcg by mouth 2 (two) times daily.     aspirin 81 MG EC tablet  Take 1 tablet (81 mg total) by mouth daily.     bisacodyl 5 MG EC tablet  Commonly known as:  DULCOLAX  Take 5 mg by mouth daily as needed for moderate constipation.     bisacodyl 10 MG suppository  Commonly known as:  DULCOLAX  Place 10 mg rectally. Every 3 days as needed     clonazePAM 0.5 MG tablet  Commonly known as:  KLONOPIN  Take 0.5 mg by mouth at bedtime.     clopidogrel 75 MG tablet  Commonly known as:  PLAVIX  Take 75 mg by mouth daily.     colchicine 0.6 MG tablet  Take 0.6 mg by mouth daily.     DECUBI-VITE PO  Take 1 capsule by mouth daily.     famotidine 20 MG tablet  Commonly known as:  PEPCID  Take 20 mg by mouth at bedtime. Reported on 02/22/2015     ipratropium 0.06 % nasal spray  Commonly known as:  ATROVENT  Place 2 sprays into both nostrils 2 (two) times daily as needed for rhinitis.     levothyroxine 137 MCG tablet  Commonly known as:   SYNTHROID, LEVOTHROID  Take 137 mcg by mouth daily before breakfast.     magnesium citrate Soln  Take 0.5 Bottles by mouth as needed for mild constipation, moderate constipation or severe constipation.     meloxicam 7.5 MG tablet  Commonly known as:  MOBIC  Take 1 tablet (7.5 mg total) by mouth daily.     midodrine 5 MG tablet  Commonly known as:  PROAMATINE  Take 1 tablet (5 mg total) by mouth 3 (three) times daily with meals. Take third  dose at least 6 hours before bedtime     pantoprazole 40 MG tablet  Commonly known as:  PROTONIX  TAKE 1 TABLET BY MOUTH TWICE A DAY     pravastatin 40 MG tablet  Commonly known as:  PRAVACHOL  Take 40 mg by mouth daily.     rOPINIRole 0.5 MG tablet  Commonly known as:  REQUIP  Take 0.5 mg by mouth every evening.     senna 8.6 MG tablet  Commonly known as:  SENOKOT  Take one tablet by mouth every other day at bedtime for constipation     tamsulosin 0.4 MG Caps capsule  Commonly known as:  FLOMAX  Take 0.4 mg by mouth daily.     torsemide 10 MG tablet  Commonly known as:  DEMADEX  Take 10 mg by mouth. Take one tablet by mouth every day as needed only if patient has weight gain of 3lbs or more in a 24 hour period.     traZODone 50 MG tablet  Commonly known as:  DESYREL  Take 50 mg by mouth at bedtime.     Vitamin D3 5000 units Caps  Take 1 capsule by mouth every Monday. Reported on 02/22/2015        Review of Systems  HENT: Positive for hearing loss and tinnitus.   Eyes: Negative for photophobia and pain.  Respiratory: Negative for cough and shortness of breath.   Cardiovascular: Positive for leg swelling. Negative for chest pain.       1+. Chronic, compression hosiery.   Gastrointestinal: Positive for constipation. Negative for nausea, vomiting, abdominal pain and diarrhea.  Genitourinary: Positive for frequency and enuresis. Negative for hematuria.       Gross hematuria x 2 days.   Musculoskeletal: Negative for myalgias, back  pain and neck pain.       Ambulates with walker.   Skin: Negative for rash.       Coccyx pressure ulcer healing nicely  Neurological: Positive for dizziness and weakness. Negative for tremors.       Insomnia, restless legs, improved  Patient reports that he has H/O dizziness but has been worse in past 4 months. This makes the patient feel unbalanced. Improved  R hand numbness. Improved  Psychiatric/Behavioral: Negative for suicidal ideas and hallucinations. The patient is not nervous/anxious.        Not enough sleep, decreased energy     Immunization History  Administered Date(s) Administered  . Influenza-Unspecified 10/05/2012, 12/05/2013, 11/05/2014  . PPD Test 03/07/2014  . Pneumococcal-Unspecified 10/05/2009   Pertinent  Health Maintenance Due  Topic Date Due  . PNA vac Low Risk Adult (2 of 2 - PCV13) 10/06/2010  . INFLUENZA VACCINE  09/05/2015  . DEXA SCAN  12/14/2024   Fall Risk  02/02/2015  Falls in the past year? No   Functional Status Survey:    Filed Vitals:   06/08/15 1124  BP: 110/60  Pulse: 70  Temp: 98.5 F (36.9 C)  TempSrc: Oral  Resp: 24  Height: 5\' 10"  (1.778 m)  Weight: 184 lb (83.462 kg)   Body mass index is 26.4 kg/(m^2). Physical Exam  Constitutional: He is oriented to person, place, and time. He appears well-developed and well-nourished.  HENT:  Head: Normocephalic and atraumatic.  Right Ear: External ear normal.  Left Ear: External ear normal.  Eyes: Conjunctivae and EOM are normal. Pupils are equal, round, and reactive to light. Right eye exhibits no discharge. Left eye exhibits no discharge.  Neck: Normal  range of motion. Neck supple. No JVD present. No tracheal deviation present. No thyromegaly present.  Cardiovascular:  Murmur heard. 99991111 systolic  Pulmonary/Chest: Effort normal and breath sounds normal. No respiratory distress. He has no wheezes. He has no rales.  Abdominal: He exhibits no distension. There is no tenderness.  abd  hernia  Genitourinary: No penile tenderness.  Gross hematuria x 2 days.   Musculoskeletal: Normal range of motion. He exhibits edema. He exhibits no tenderness.  1+ edema BLE  Neurological: He is alert and oriented to person, place, and time. He has normal reflexes. No cranial nerve deficit. He exhibits normal muscle tone. Coordination normal.  Skin: Skin is warm and dry. No rash noted. No erythema.  Healing pressure ulcer coccyx  Psychiatric: He has a normal mood and affect. His behavior is normal. Judgment and thought content normal.    Labs reviewed:  Recent Labs  07/26/14 1359  12/06/14 1226 12/29/14 1455 12/30/14 0524 02/16/15 04/06/15 04/20/15  NA 140  < > 137 137 138 139 142 142  K 4.8  < > 4.2 4.3 4.2 4.3 4.6 4.6  CL 102  < > 105 104 105  --   --   --   CO2 28  < > 28 27 27   --   --   --   GLUCOSE 83  < > 103* 123* 132*  --   --   --   BUN 17  < > 15 20 15 19 16 16   CREATININE 0.81  < > 0.61 0.98 0.83 0.9 0.9 0.9  CALCIUM 9.3  < > 8.4* 9.1 8.7*  --   --   --   MG 2.4  --   --   --   --   --   --   --   < > = values in this interval not displayed.  Recent Labs  12/03/14 1238 12/06/14 1226 12/29/14 1455 02/16/15 04/20/15  AST 32 19 27 19  33  ALT 32 20 29 25  38  ALKPHOS 70 61 65 59 54  BILITOT 0.6 0.5 0.6  --   --   PROT 7.1 6.6 7.0  --   --   ALBUMIN 3.6 3.2* 3.9  --   --     Recent Labs  07/14/14 1725  12/06/14 1226 12/29/14 1455 12/30/14 0524 02/16/15 04/06/15 04/20/15  WBC 7.4  < > 7.0 13.4* 10.5 5.7 9.6 5.8  NEUTROABS 4.9  --  5.1 11.2*  --   --   --   --   HGB 14.5  < > 12.8* 14.3 12.9* 13.2* 12.1* 12.1*  HCT 43.7  < > 38.9* 42.3 39.2 40* 36* 37*  MCV 96.0  < > 98.2 99.1 99.7  --   --   --   PLT 298.0  < > 204 208 194 231 220 221  < > = values in this interval not displayed. Lab Results  Component Value Date   TSH 2.98 04/20/2015   Lab Results  Component Value Date   HGBA1C 5.9 04/20/2015   Lab Results  Component Value Date   CHOL   09/19/2009    93        ATP III CLASSIFICATION:  <200     mg/dL   Desirable  200-239  mg/dL   Borderline High  >=240    mg/dL   High          HDL 34* 09/19/2009   [place]  09/19/2009  39        Total Cholesterol/HDL:CHD Risk Coronary Heart Disease Risk Table                     Men   Women  1/2 Average Risk   3.4   3.3  Average Risk       5.0   4.4  2 X Average Risk   9.6   7.1  3 X Average Risk  23.4   11.0        Use the calculated Patient Ratio above and the CHD Risk Table to determine the patient's CHD Risk.        ATP III CLASSIFICATION (LDL):  <100     mg/dL   Optimal  100-129  mg/dL   Near or Above                    Optimal  130-159  mg/dL   Borderline  160-189  mg/dL   High  >190     mg/dL   Very High   TRIG 98 09/19/2009   CHOLHDL 2.7 09/19/2009    Significant Diagnostic Results in last 30 days:  No results found.  Assessment/Plan  Arterial hypotension Blood pressure is controlled, continue Midodrine. Contributory to dizziness and unsteadiness associated with position change, improved.    Combined congestive systolic and diastolic heart failure (HCC) Compensated clinically, chronic BLE edema, continue Torsemide prn, BNP 96.8 02/21/15. 04/06/15 Na 142, K 4.6, Bun 16, creat 0.89, 04/20/14 BNP 111.7, Na 142, K 4.6, Bun 16, creat 0.9   Constipation Stable, continue Amitiza, change Senokot S to II qhs, prn Bisacodyl suppository and po   Depression with anxiety Continue Trazodone 50mg  qhs and Clonazepam 0.5mg  qhs.    Dizziness, after diuretic asscoiated with hypotension and responded to fluid bolus Hx of it, improved, CBC, CMP unremarkable 04/06/15, 04/28/15 US carotid R+L normal bilateral arterial velocities.     GERD (gastroesophageal reflux disease) Stable, continue Protonix and Famotidine    Gout No gouty attacks, continue Colchicine 0.6mg  daily,   Hypokalemia 02/21/15 K 4.3 04/06/15 K 4.6 04/20/15 K 4.6    Hypothyroidism Continue  Levothyroxine 135 mcg, TSH 7.70 10/24/14, 9.021 02/16/15, 04/20/15 2.98   Insomnia Stable, continue Trazodone 50mg  daily and Clonazepam 0.5mg  hs.   Restless leg Stable, continue Requip 0.5mg  daily.    Sustained ventricular tachycardia Heart rate is in control, continue Amiodarone 400mg  daily.    Urinary frequency 4-6x/night, urology, continue Tamisulosin 0.4mg  daily.     Family/ staff Communication: continue to observe the patient.   Labs/tests ordered:  none

## 2015-06-08 NOTE — Assessment & Plan Note (Signed)
Stable, continue Amitiza, change Senokot S to II qhs, prn Bisacodyl suppository and po

## 2015-06-08 NOTE — Assessment & Plan Note (Signed)
02/21/15 K 4.3 04/06/15 K 4.6 04/20/15 K 4.6

## 2015-06-08 NOTE — Assessment & Plan Note (Signed)
Heart rate is in control, continue Amiodarone 400mg  daily.

## 2015-06-20 ENCOUNTER — Encounter: Payer: Self-pay | Admitting: Nurse Practitioner

## 2015-06-22 ENCOUNTER — Encounter: Payer: Self-pay | Admitting: Nurse Practitioner

## 2015-06-22 ENCOUNTER — Non-Acute Institutional Stay: Payer: Medicare Other | Admitting: Nurse Practitioner

## 2015-06-22 DIAGNOSIS — K5900 Constipation, unspecified: Secondary | ICD-10-CM | POA: Diagnosis not present

## 2015-06-22 DIAGNOSIS — E876 Hypokalemia: Secondary | ICD-10-CM

## 2015-06-22 DIAGNOSIS — F418 Other specified anxiety disorders: Secondary | ICD-10-CM

## 2015-06-22 DIAGNOSIS — R42 Dizziness and giddiness: Secondary | ICD-10-CM

## 2015-06-22 DIAGNOSIS — I472 Ventricular tachycardia, unspecified: Secondary | ICD-10-CM

## 2015-06-22 DIAGNOSIS — M1000 Idiopathic gout, unspecified site: Secondary | ICD-10-CM | POA: Diagnosis not present

## 2015-06-22 DIAGNOSIS — R350 Frequency of micturition: Secondary | ICD-10-CM | POA: Diagnosis not present

## 2015-06-22 DIAGNOSIS — G4700 Insomnia, unspecified: Secondary | ICD-10-CM | POA: Diagnosis not present

## 2015-06-22 DIAGNOSIS — E039 Hypothyroidism, unspecified: Secondary | ICD-10-CM | POA: Diagnosis not present

## 2015-06-22 DIAGNOSIS — K219 Gastro-esophageal reflux disease without esophagitis: Secondary | ICD-10-CM | POA: Diagnosis not present

## 2015-06-22 DIAGNOSIS — G2581 Restless legs syndrome: Secondary | ICD-10-CM

## 2015-06-22 DIAGNOSIS — N390 Urinary tract infection, site not specified: Secondary | ICD-10-CM | POA: Diagnosis not present

## 2015-06-22 DIAGNOSIS — I950 Idiopathic hypotension: Secondary | ICD-10-CM

## 2015-06-22 DIAGNOSIS — I5040 Unspecified combined systolic (congestive) and diastolic (congestive) heart failure: Secondary | ICD-10-CM

## 2015-06-22 NOTE — Assessment & Plan Note (Signed)
Stable, continue Amitiza, change Senokot S to II qhs, prn Bisacodyl suppository and po

## 2015-06-22 NOTE — Assessment & Plan Note (Signed)
Blood pressure is controlled, continue Midodrine. Contributory to dizziness and unsteadiness associated with position change, improved.

## 2015-06-22 NOTE — Assessment & Plan Note (Signed)
Heart rate is in control, continue Amiodarone 400mg  daily.

## 2015-06-22 NOTE — Assessment & Plan Note (Signed)
Stable, continue Requip 0.5mg  daily.

## 2015-06-22 NOTE — Assessment & Plan Note (Signed)
Stable, continue Protonix and Famotidine

## 2015-06-22 NOTE — Assessment & Plan Note (Signed)
No gouty attacks, continue Colchicine 0.6mg  daily,

## 2015-06-22 NOTE — Assessment & Plan Note (Signed)
Continue Trazodone 50mg  qhs and Clonazepam 0.5mg  qhs.

## 2015-06-22 NOTE — Assessment & Plan Note (Signed)
Continue Levothyroxine 135 mcg, TSH 7.70 10/24/14, 9.021 02/16/15, 04/20/15 2.98

## 2015-06-22 NOTE — Assessment & Plan Note (Signed)
Hx of it, improved, CBC, CMP unremarkable 04/06/15, 04/28/15 US carotid R+L normal bilateral arterial velocities.

## 2015-06-22 NOTE — Assessment & Plan Note (Signed)
Stable, continue Trazodone 50mg  daily and Clonazepam 0.5mg  hs.

## 2015-06-22 NOTE — Progress Notes (Signed)
Patient ID: Adam [name], male   DOB: May 28, 1926, 82 y.o.   MRN: [id number]  Location:  [place] Room Number: [id number] Place of Service: [facility] Provider:  Marlana Latus NP  Maximo Spratling X, NP  Patient Care Team: Anitta Tenny X, NP as PCP - General (Internal Medicine) [name], MD as Attending Physician (Cardiology) Vevelyn Royals, MD as Consulting Physician (Ophthalmology) Franchot Gallo, MD as Consulting Physician (Urology) Tanda Rockers, MD as Consulting Physician (Pulmonary Disease) Allyn Kenner, MD (Dermatology) Deliah Goody, PA-C as Physician Assistant (Physician Assistant)  Extended Emergency Contact Information Primary Emergency Contact: [name],Adam S Address: [address], [place] Phone: [phone] Mobile Phone: [phone] Relation: Spouse Secondary Emergency Contact: [name],Adam  [place] Phone: [phone] Mobile Phone: [phone] Relation: Daughter  Code Status:  DNR Goals of care: Advanced Directive information Advanced Directives 06/22/2015  Does patient have an advance directive? Yes  Type of Advance Directive Living will;Healthcare Power of Attorney  Does patient want to make changes to advanced directive? No - Patient declined  Copy of advanced directive(s) in chart? Yes     Chief Complaint  Patient presents with  . Urinary Tract Infection    Per Labs    HPI:  Pt is a 82 y.o. male seen today for medical evaluation of Hx of Levothyroxine 171mcg, TSH trending up to 9.021 02/21/15.  Hx of edema, Afib, heart rate is in controlled, taking Amiodarone 400mg  daily,  Gout, stable, taking Colchicine 0.6mg  daily,  osteoarthritis, pain is managed with Mobic 7.5mg  daily, GERD, stable while on Pantoprazole 40mg  and Famotidine 20mg  daily, constipation, orthostatic hypotension is managed with Midodrine 5mg  tid, restless leg is managed with Requip 0.5mg  nightly, chronic urinary  frequency while on Tamsulosin 0.4mg , taking Amitiza 8mg  bid and Senokot S I qod for constipation, prn Torsemide for weight gain, Clonazepam 0.5mg  and Trazodone 50mg   nightly for sleep.    06/10/15 urine culture E. Coli, 7 day course of Cipro bid started 06/11/15   Past Medical History  Diagnosis Date  . CAD (coronary artery disease)     CABG (LIMA-LAD, SVG-RCA, SVG-OM in 1996).  07/2009 BMS to SVG-RCA. Cath in 04/2010 with patent stents   . Bradycardia     AFib/SSS s/p St Jude PPM 04/12/2008  . Hypothyroid   . Pacemaker   . Hyperlipidemia   . GERD (gastroesophageal reflux disease)   . Hiatal hernia   . Insomnia   . S/P CABG x 4   . RBBB   . Melanoma of back ([place]) 1976  . Heart murmur     "just told I had one today" (12/15/2012)  . Myocardial infarction [facility]) 1996; 2011    "both silent" (12/15/2012)  . Exertional shortness of breath     "sometimes walking" (12/15/2012)  . Arthritis     "minor, back and sometimes knees" (12/15/2012)  . Sustained ventricular tachycardia ([place]) 07/27/2014  . Hypertension   . [facility] spotted fever   . CHF (congestive heart failure) ([place])   . Osteoporosis, senile    Past Surgical History  Procedure Laterality Date  . US echocardiography  07/11/2009    EF 45-50%  . Nm myoview ltd  06/2011    low risk  . Coronary artery bypass graft  1996    LIMA to LAD,SVG to RCA & SVG to OM  . Tonsillectomy  1938  . Coronary angioplasty  with stent placement  07/2009    bare metal stent to SVG to the RCA  . Cardiac catheterization  04/2010    LIMA to LAD patent,SVG to OM patent,no in-stnet restenosis RCA  . Cataract extraction w/ intraocular lens  implant, bilateral Bilateral 2012  . Melanoma excision  05/1974 X2    "taken off my back" (12/15/2012)  . Transurethral resection of prostate  1986  . Insert / replace / remove pacemaker  2010    Allergies  Allergen Reactions  . Crestor [Rosuvastatin Calcium]     Unknown reaction   . Altace [Ramipril] Other (See  Comments)    Cough  . Bactrim [Sulfamethoxazole-Trimethoprim] Rash  . Penicillins Rash    Has patient had a PCN reaction causing immediate rash, facial/tongue/throat swelling, SOB or lightheadedness with hypotension: No Has patient had a PCN reaction causing severe rash involving mucus membranes or skin necrosis: Yes Has patient had a PCN reaction that required hospitalization Yes Has patient had a PCN reaction occurring within the last 10 years: No If all of the above answers are "NO", then may proceed with Cephalosporin use.   . Sulfa Antibiotics Rash      Medication List       This list is accurate as of: 06/22/15  2:36 PM.  Always use your most recent med list.               acetaminophen 325 MG tablet  Commonly known as:  TYLENOL  Take 650 mg by mouth every 6 (six) hours as needed for mild pain or moderate pain.     amiodarone 200 MG tablet  Commonly known as:  PACERONE  Take 400 mg by mouth daily.     AMITIZA 8 MCG capsule  Generic drug:  lubiprostone  Take 8 mcg by mouth 2 (two) times daily.     aspirin 81 MG EC tablet  Take 1 tablet (81 mg total) by mouth daily.     bisacodyl 5 MG EC tablet  Commonly known as:  DULCOLAX  Take 5 mg by mouth daily as needed for moderate constipation.     bisacodyl 10 MG suppository  Commonly known as:  DULCOLAX  Place 10 mg rectally. Every 3 days as needed     clonazePAM 0.5 MG tablet  Commonly known as:  KLONOPIN  Take 0.5 mg by mouth at bedtime. Take 1 tablet by mouth every 4 hours as needed for anxiety/restless legs     clopidogrel 75 MG tablet  Commonly known as:  PLAVIX  Take 75 mg by mouth daily.     colchicine 0.6 MG tablet  Take 0.6 mg by mouth daily.     DECUBI-VITE PO  Take 1 capsule by mouth daily.     famotidine 20 MG tablet  Commonly known as:  PEPCID  Take 20 mg by mouth at bedtime. Reported on 02/22/2015     ipratropium 0.06 % nasal spray  Commonly known as:  ATROVENT  Place 2 sprays into both  nostrils 2 (two) times daily as needed for rhinitis.     levothyroxine 137 MCG tablet  Commonly known as:  SYNTHROID, LEVOTHROID  Take 137 mcg by mouth daily before breakfast.     magnesium citrate Soln  Take 0.5 Bottles by mouth as needed for mild constipation, moderate constipation or severe constipation.     meloxicam 7.5 MG tablet  Commonly known as:  MOBIC  Take 1 tablet (7.5 mg total) by mouth daily.  midodrine 5 MG tablet  Commonly known as:  PROAMATINE  Take 1 tablet (5 mg total) by mouth 3 (three) times daily with meals. Take third dose at least 6 hours before bedtime     pantoprazole 40 MG tablet  Commonly known as:  PROTONIX  TAKE 1 TABLET BY MOUTH TWICE A DAY     pravastatin 40 MG tablet  Commonly known as:  PRAVACHOL  Take 40 mg by mouth daily.     rOPINIRole 0.5 MG tablet  Commonly known as:  REQUIP  Take 0.5 mg by mouth every evening.     senna 8.6 MG tablet  Commonly known as:  SENOKOT  Take one tablet by mouth every other day at bedtime for constipation     tamsulosin 0.4 MG Caps capsule  Commonly known as:  FLOMAX  Take 0.4 mg by mouth daily.     torsemide 10 MG tablet  Commonly known as:  DEMADEX  Take one tablet by mouth every day as needed only if patient has weight gain of 3lbs or more in a 24 hour period.     traZODone 50 MG tablet  Commonly known as:  DESYREL  Take 50 mg by mouth at bedtime.     Vitamin D3 5000 units Caps  Take 1 capsule by mouth every Monday. Reported on 02/22/2015        Review of Systems  HENT: Positive for hearing loss and tinnitus.   Eyes: Negative for photophobia and pain.  Respiratory: Negative for cough and shortness of breath.   Cardiovascular: Positive for leg swelling. Negative for chest pain.       1+. Chronic, compression hosiery.   Gastrointestinal: Positive for constipation. Negative for nausea, vomiting, abdominal pain and diarrhea.  Genitourinary: Positive for frequency and enuresis. Negative for  hematuria.       Gross hematuria x 2 days.   Musculoskeletal: Negative for myalgias, back pain and neck pain.       Ambulates with walker.   Skin: Negative for rash.       Coccyx pressure ulcer healing nicely  Neurological: Positive for dizziness and weakness. Negative for tremors.       Insomnia, restless legs, improved  Patient reports that he has H/O dizziness but has been worse in past 4 months. This makes the patient feel unbalanced. Improved  R hand numbness. Improved  Psychiatric/Behavioral: Negative for suicidal ideas and hallucinations. The patient is not nervous/anxious.        Not enough sleep, decreased energy     Immunization History  Administered Date(s) Administered  . Influenza-Unspecified 10/05/2012, 12/05/2013, 11/05/2014  . PPD Test 03/07/2014  . Pneumococcal-Unspecified 10/05/2009   Pertinent  Health Maintenance Due  Topic Date Due  . PNA vac Low Risk Adult (2 of 2 - PCV13) 10/06/2010  . INFLUENZA VACCINE  09/05/2015  . DEXA SCAN  12/14/2024   Fall Risk  02/02/2015  Falls in the past year? No   Functional Status Survey:    Filed Vitals:   06/22/15 1249  BP: 124/70  Pulse: 68  Temp: 98.8 F (37.1 C)  TempSrc: Oral  Resp: 18  Height: 5\' 10"  (1.778 m)  Weight: 186 lb 6.4 oz (84.55 kg)   Body mass index is 26.75 kg/(m^2). Physical Exam  Constitutional: He is oriented to person, place, and time. He appears well-developed and well-nourished.  HENT:  Head: Normocephalic and atraumatic.  Right Ear: External ear normal.  Left Ear: External ear normal.  Eyes: Conjunctivae and  EOM are normal. Pupils are equal, round, and reactive to light. Right eye exhibits no discharge. Left eye exhibits no discharge.  Neck: Normal range of motion. Neck supple. No JVD present. No tracheal deviation present. No thyromegaly present.  Cardiovascular:  Murmur heard. [id number] systolic  Pulmonary/Chest: Effort normal and breath sounds normal. No respiratory distress. He has  no wheezes. He has no rales.  Abdominal: He exhibits no distension. There is no tenderness.  abd hernia  Genitourinary: No penile tenderness.  Gross hematuria x 2 days.   Musculoskeletal: Normal range of motion. He exhibits edema. He exhibits no tenderness.  1+ edema BLE  Neurological: He is alert and oriented to person, place, and time. He has normal reflexes. No cranial nerve deficit. He exhibits normal muscle tone. Coordination normal.  Skin: Skin is warm and dry. No rash noted. No erythema.  Healing pressure ulcer coccyx  Psychiatric: He has a normal mood and affect. His behavior is normal. Judgment and thought content normal.    Labs reviewed:  Recent Labs  07/26/14 1359  12/06/14 1226 12/29/14 1455 12/30/14 0524 02/16/15 04/06/15 04/20/15  NA 140  < > 137 137 138 139 142 142  K 4.8  < > 4.2 4.3 4.2 4.3 4.6 4.6  CL 102  < > 105 104 105  --   --   --   CO2 28  < > 28 27 27   --   --   --   GLUCOSE 83  < > 103* 123* 132*  --   --   --   BUN 17  < > 15 20 15 19 16 16   CREATININE 0.81  < > 0.61 0.98 0.83 0.9 0.9 0.9  CALCIUM 9.3  < > 8.4* 9.1 8.7*  --   --   --   MG 2.4  --   --   --   --   --   --   --   < > = values in this interval not displayed.  Recent Labs  12/03/14 1238 12/06/14 1226 12/29/14 1455 02/16/15 04/20/15  AST 32 19 27 19  33  ALT 32 20 29 25  38  ALKPHOS 70 61 65 59 54  BILITOT 0.6 0.5 0.6  --   --   PROT 7.1 6.6 7.0  --   --   ALBUMIN 3.6 3.2* 3.9  --   --     Recent Labs  07/14/14 1725  12/06/14 1226 12/29/14 1455 12/30/14 0524 02/16/15 04/06/15 04/20/15  WBC 7.4  < > 7.0 13.4* 10.5 5.7 9.6 5.8  NEUTROABS 4.9  --  5.1 11.2*  --   --   --   --   HGB 14.5  < > 12.8* 14.3 12.9* 13.2* 12.1* 12.1*  HCT 43.7  < > 38.9* 42.3 39.2 40* 36* 37*  MCV 96.0  < > 98.2 99.1 99.7  --   --   --   PLT 298.0  < > 204 208 194 231 220 221  < > = values in this interval not displayed. Lab Results  Component Value Date   TSH 2.98 04/20/2015   Lab Results    Component Value Date   HGBA1C 5.9 04/20/2015   Lab Results  Component Value Date   CHOL  09/19/2009    93        ATP III CLASSIFICATION:  <200     mg/dL   Desirable  200-239  mg/dL   Borderline High  >=240  mg/dL   High          HDL 34* 09/19/2009   LDLCALC  09/19/2009    39        Total Cholesterol/HDL:CHD Risk Coronary Heart Disease Risk Table                     Men   Women  1/2 Average Risk   3.4   3.3  Average Risk       5.0   4.4  2 X Average Risk   9.6   7.1  3 X Average Risk  23.4   11.0        Use the calculated Patient Ratio above and the CHD Risk Table to determine the patient's CHD Risk.        ATP III CLASSIFICATION (LDL):  <100     mg/dL   Optimal  100-129  mg/dL   Near or Above                    Optimal  130-159  mg/dL   Borderline  160-189  mg/dL   High  >190     mg/dL   Very High   TRIG 98 09/19/2009   CHOLHDL 2.7 09/19/2009    Significant Diagnostic Results in last 30 days:  No results found.  Assessment/Plan  Urinary tract infection 06/10/15 urine culture E. Coli, 7 day course of Cipro bid started 06/11/15     Urinary frequency 4-6x/night, urology, continue Tamisulosin 0.4mg  daily.    Sustained ventricular tachycardia Heart rate is in control, continue Amiodarone 400mg  daily.     Restless leg Stable, continue Requip 0.5mg  daily.     Insomnia Stable, continue Trazodone 50mg  daily and Clonazepam 0.5mg  hs.    Hypothyroidism Continue Levothyroxine 135 mcg, TSH 7.70 10/24/14, 9.021 02/16/15, 04/20/15 2.98    Hypokalemia 02/21/15 K 4.3 04/06/15 K 4.6 04/20/15 K 4.6     Gout No gouty attacks, continue Colchicine 0.6mg  daily,    GERD (gastroesophageal reflux disease) Stable, continue Protonix and Famotidine   Dizziness, after diuretic asscoiated with hypotension and responded to fluid bolus Hx of it, improved, CBC, CMP unremarkable 04/06/15, 04/28/15 US carotid R+L normal bilateral arterial velocities.    Depression with  anxiety Continue Trazodone 50mg  qhs and Clonazepam 0.5mg  qhs.     Constipation Stable, continue Amitiza, change Senokot S to II qhs, prn Bisacodyl suppository and po    Combined congestive systolic and diastolic heart failure (HCC) Compensated clinically, chronic BLE edema, continue Torsemide prn, BNP 96.8 02/21/15. 04/06/15 Na 142, K 4.6, Bun 16, creat 0.89, 04/20/14 BNP 111.7, Na 142, K 4.6, Bun 16, creat 0.9    Arterial hypotension Blood pressure is controlled, continue Midodrine. Contributory to dizziness and unsteadiness associated with position change, improved.       Family/ staff Communication: continue to observe the patient.   Labs/tests ordered:  none

## 2015-06-22 NOTE — Assessment & Plan Note (Signed)
Compensated clinically, chronic BLE edema, continue Torsemide prn, BNP 96.8 02/21/15. 04/06/15 Na 142, K 4.6, Bun 16, creat 0.89, 04/20/14 BNP 111.7, Na 142, K 4.6, Bun 16, creat 0.9   

## 2015-06-22 NOTE — Assessment & Plan Note (Signed)
4-6x/night, urology, continue Tamisulosin 0.4mg  daily.

## 2015-06-22 NOTE — Assessment & Plan Note (Signed)
06/10/15 urine culture E. Coli, 7 day course of Cipro bid started 06/11/15

## 2015-06-22 NOTE — Assessment & Plan Note (Signed)
02/21/15 K 4.3 04/06/15 K 4.6 04/20/15 K 4.6

## 2015-07-27 ENCOUNTER — Other Ambulatory Visit: Payer: Self-pay | Admitting: *Deleted

## 2015-07-27 MED ORDER — MELOXICAM 7.5 MG PO TABS: 7.5000 mg | ORAL_TABLET | Freq: Every day | ORAL | Status: DC

## 2015-07-27 MED ORDER — CLONAZEPAM 0.5 MG PO TABS: 0.5000 mg | ORAL_TABLET | Freq: Every day | ORAL | Status: DC

## 2015-07-27 MED ORDER — TAMSULOSIN HCL 0.4 MG PO CAPS: 0.4000 mg | ORAL_CAPSULE | Freq: Every day | ORAL | Status: DC

## 2015-07-27 NOTE — Telephone Encounter (Signed)
[place] 

## 2015-08-01 ENCOUNTER — Encounter: Payer: Medicare Other | Admitting: Internal Medicine

## 2015-08-07 ENCOUNTER — Other Ambulatory Visit: Payer: Self-pay

## 2015-08-07 MED ORDER — COLCHICINE 0.6 MG PO TABS: ORAL_TABLET | ORAL | Status: DC

## 2015-08-07 MED ORDER — AMIODARONE HCL 200 MG PO TABS: ORAL_TABLET | ORAL | Status: DC

## 2015-08-07 NOTE — Telephone Encounter (Signed)
Refill request from [facility]

## 2015-08-09 LAB — BASIC METABOLIC PANEL
BUN: 18 mg/dL (ref 4–21)
Creatinine: 7 mg/dL — AB (ref 0.6–1.3)
Glucose: 106 mg/dL
Potassium: 4.6 mmol/L (ref 3.4–5.3)
Sodium: 142 mmol/L (ref 137–147)

## 2015-08-23 ENCOUNTER — Ambulatory Visit: Payer: Medicare Other | Admitting: Neurology

## 2015-08-27 ENCOUNTER — Other Ambulatory Visit: Payer: Self-pay | Admitting: Internal Medicine

## 2015-08-31 ENCOUNTER — Encounter: Payer: Self-pay | Admitting: Nurse Practitioner

## 2015-08-31 ENCOUNTER — Encounter: Payer: Medicare Other | Admitting: Nurse Practitioner

## 2015-09-02 ENCOUNTER — Other Ambulatory Visit: Payer: Self-pay | Admitting: Internal Medicine

## 2015-09-05 ENCOUNTER — Encounter: Payer: Self-pay | Admitting: Internal Medicine

## 2015-09-05 ENCOUNTER — Non-Acute Institutional Stay: Payer: Medicare Other | Admitting: Internal Medicine

## 2015-09-05 VITALS — BP 110/62 | HR 70 | Temp 97.5°F | Ht 70.0 in | Wt 187.0 lb

## 2015-09-05 DIAGNOSIS — I255 Ischemic cardiomyopathy: Secondary | ICD-10-CM | POA: Diagnosis not present

## 2015-09-05 DIAGNOSIS — E039 Hypothyroidism, unspecified: Secondary | ICD-10-CM | POA: Diagnosis not present

## 2015-09-05 DIAGNOSIS — M1000 Idiopathic gout, unspecified site: Secondary | ICD-10-CM | POA: Diagnosis not present

## 2015-09-05 DIAGNOSIS — I252 Old myocardial infarction: Secondary | ICD-10-CM

## 2015-09-05 DIAGNOSIS — G4700 Insomnia, unspecified: Secondary | ICD-10-CM

## 2015-09-05 DIAGNOSIS — E785 Hyperlipidemia, unspecified: Secondary | ICD-10-CM | POA: Diagnosis not present

## 2015-09-05 DIAGNOSIS — I5040 Unspecified combined systolic (congestive) and diastolic (congestive) heart failure: Secondary | ICD-10-CM | POA: Diagnosis not present

## 2015-09-05 DIAGNOSIS — R350 Frequency of micturition: Secondary | ICD-10-CM | POA: Diagnosis not present

## 2015-09-05 DIAGNOSIS — K219 Gastro-esophageal reflux disease without esophagitis: Secondary | ICD-10-CM

## 2015-09-05 DIAGNOSIS — I10 Essential (primary) hypertension: Secondary | ICD-10-CM

## 2015-09-05 DIAGNOSIS — L8990 Pressure ulcer of unspecified site, unspecified stage: Secondary | ICD-10-CM

## 2015-09-05 DIAGNOSIS — G2581 Restless legs syndrome: Secondary | ICD-10-CM | POA: Diagnosis not present

## 2015-09-05 DIAGNOSIS — I2510 Atherosclerotic heart disease of native coronary artery without angina pectoris: Secondary | ICD-10-CM

## 2015-09-05 MED ORDER — TAMSULOSIN HCL 0.4 MG PO CAPS: ORAL_CAPSULE | ORAL | 0 refills | Status: DC

## 2015-09-05 MED ORDER — PANTOPRAZOLE SODIUM 40 MG PO TBEC: DELAYED_RELEASE_TABLET | ORAL | 11 refills | Status: DC

## 2015-09-05 NOTE — Progress Notes (Signed)
Patient ID: Adam [name], male   DOB: 1926/08/16, 82 y.o.   MRN: [id number]    [place] of Service: Clinic (12)     Allergies  Allergen Reactions  . Crestor [Rosuvastatin Calcium]     Unknown reaction   . Altace [Ramipril] Other (See Comments)    Cough  . Bactrim [Sulfamethoxazole-Trimethoprim] Rash  . Penicillins Rash    Has patient had a PCN reaction causing immediate rash, facial/tongue/throat swelling, SOB or lightheadedness with hypotension: No Has patient had a PCN reaction causing severe rash involving mucus membranes or skin necrosis: Yes Has patient had a PCN reaction that required hospitalization Yes Has patient had a PCN reaction occurring within the last 10 years: No If all of the above answers are "NO", then may proceed with Cephalosporin use.   . Sulfa Antibiotics Rash    Chief Complaint  Patient presents with  . Medical Management of Chronic Issues    medication management blood pressure, thyroid, depression, CHF. Here with wife. Moved from [facility] to [facility]    HPI:  This is my initial visit with Mr. Gloss. He has been seen several times by [facility] Mast, NP while he was on the [facility] assisted-living area. Patient had been hospitalized in late 2016 and then went to [place] home for SNF rehabilitation. Following this, he was admitted to [place] and then he obtained an apartment at [facility] and has now moved into independent living.  I previously followed Mr. Hazard when I was a physician with [facility] [place]. He remained with that practice when I broke away from it.  Patient has a multitude of conditions that have been problematic for him.  Patient has congestive cardiomyopathy and is followed by Dr. Sallyanne Kuster. Last ejection fraction was done in March 2017 with nuclear stress study. It was 32%. Patient has postural hypotension which interferes with the use of some drugs that might ordinarily be picked to assist  with control of his congestive cardiomyopathy. Patient has a history of coronary artery disease and CABG in about 1984. He has had occasional chest pains, but they are not necessarily anginal.  Patient had problems with dizziness during and after his hospitalization in 2016, but this is generally improved now. He also complains of problems of weakness where sometimes he cannot hold his fork or a pin. This has been going on for at least 2 years. There is not a defined diagnosis attached to this problem. He is to see Dr. Rexene Alberts, neurologist, next week.  There are other neurologic issues included documented cerebral atrophy and cerebellar atrophy on a CT scan of the brain. He feels that his memory has become impaired going back approximately 3 years.  There are problems of edema for which he uses compression stockings. The stockings have been helpful in controlling the edema. It is less in the morning then later in the day.  He is chronically hypothyroid and is compensated with levothyroxine.  There are problems of urinary frequency up to 7 times nightly. He also has had recurrent urinary tract infections with last one being 06/10/2015. He finds that he has to double void to prevent going back to the bathroom immediately after getting off the toilet. He has seen Dr. Dorina Hoyer. He takes tamsulosin. He may have a small contracted bladder. He has had bladder dilations that he says helped briefly for 10-14 days.  Patient has a previous history of osteoarthritis and has been taking  meloxicam regularly. He says he has been pain-free for quite some time, and is not sure that he continues to need this medication.  There is an ulceration medially on the right buttock that has been there for at least 3-4 months. It is tender. It has not been bleeding. He sits in a recliner chair most of the day.  There is a lesion on the scalp which appears to be a seborrheic keratosis that becomes irritated easily. He has  previously seen Dr. Nevada Crane, dermatologist for other lesions. Patient had a melanoma removed from the left mid back area in the past. There is been no relapse.  Constipation has been a significant and severe problem. He takes Senokot and hematochezia twice daily.  Reflux problems have been significant. He has been taking pantoprazole 40 mg twice daily as well as occasional doses of Pepcid. He says that he is doing well with control of his symptoms at the present time.  Medications: Patient's Medications  New Prescriptions   No medications on file  Previous Medications   ACETAMINOPHEN (TYLENOL) 325 MG TABLET    Take 650 mg by mouth every 6 (six) hours as needed for mild pain or moderate pain.   AMIODARONE (PACERONE) 200 MG TABLET    Take two tablets daily for heart rhythm   AMITIZA 8 MCG CAPSULE    Take 8 mcg by mouth 2 (two) times daily.   ASPIRIN EC 81 MG EC TABLET    Take 1 tablet (81 mg total) by mouth daily.   BISACODYL (DULCOLAX) 5 MG EC TABLET    Take 5 mg by mouth daily as needed for moderate constipation.   CHOLECALCIFEROL (VITAMIN D3) 5000 UNITS CAPS    Take 1 capsule by mouth every Monday. Reported on 02/22/2015   CLOBETASOL (TEMOVATE) 0.05 % EXTERNAL SOLUTION    Apply 1 application topically. Apply once or twice daily on scaple   CLONAZEPAM (KLONOPIN) 0.5 MG TABLET    Take 1 tablet (0.5 mg total) by mouth at bedtime. Take 1 tablet by mouth every 4 hours as needed for anxiety/restless legs   CLOPIDOGREL (PLAVIX) 75 MG TABLET    Take 75 mg by mouth daily.   COLCHICINE 0.6 MG TABLET    Take one tablet daily for gout   FAMOTIDINE (PEPCID) 20 MG TABLET    Take 20 mg by mouth at bedtime. Reported on 02/22/2015   LEVOTHYROXINE (SYNTHROID, LEVOTHROID) 137 MCG TABLET    Take 137 mcg by mouth daily before breakfast.   MAGNESIUM CITRATE SOLN    Take 0.5 Bottles by mouth as needed for mild constipation, moderate constipation or severe constipation.   MELOXICAM (MOBIC) 7.5 MG TABLET    TAKE 1 TABLET  EACH DAY.   MELOXICAM (MOBIC) 7.5 MG TABLET    TAKE 1 TABLET EACH DAY.   MIDODRINE (PROAMATINE) 5 MG TABLET    Take 1 tablet (5 mg total) by mouth 3 (three) times daily with meals. Take third dose at least 6 hours before bedtime   PANTOPRAZOLE (PROTONIX) 40 MG TABLET    TAKE 1 TABLET BY MOUTH TWICE A DAY   PRAVASTATIN (PRAVACHOL) 40 MG TABLET    Take 40 mg by mouth daily.   ROPINIROLE (REQUIP) 0.5 MG TABLET    Take 0.5 mg by mouth every evening.    SENNA (SENOKOT) 8.6 MG TABLET    Take one tablet by mouth every other day at bedtime for constipation   TAMSULOSIN (FLOMAX) 0.4 MG CAPS CAPSULE  TAKE 1 CAPSULE EVERY DAY.   TRAZODONE (DESYREL) 50 MG TABLET    Take 50 mg by mouth at bedtime.  Modified Medications   No medications on file  Discontinued Medications   BISACODYL (DULCOLAX) 10 MG SUPPOSITORY    Place 10 mg rectally. Every 3 days as needed   IPRATROPIUM (ATROVENT) 0.06 % NASAL SPRAY    Place 2 sprays into both nostrils 2 (two) times daily as needed for rhinitis.    MULTIPLE VITAMINS-MINERALS (DECUBI-VITE PO)    Take 1 capsule by mouth daily.   TORSEMIDE (DEMADEX) 10 MG TABLET    Take one tablet by mouth every day as needed only if patient has weight gain of 3lbs or more in a 24 hour period.     Review of Systems  Constitutional: Positive for fatigue.  HENT: Positive for hearing loss and tinnitus.   Eyes: Negative for photophobia and pain.  Respiratory: Positive for shortness of breath. Negative for cough.   Cardiovascular: Positive for leg swelling (2+ and usingcompression hosiery). Negative for chest pain.       Congestive cardiomyopathy. Coronary artery disease, status post CABG. Cardiac arrhythmias, status post pacemaker implanted left upper chest.  Gastrointestinal: Positive for constipation. Negative for abdominal pain, diarrhea, nausea and vomiting.  Genitourinary: Positive for enuresis and frequency. Negative for hematuria.       Double voids. Urgency. Frequency. Nocturia x  7.  Musculoskeletal: Positive for gait problem (using rolloing walker). Negative for back pain, myalgias and neck pain.       Ambulates with walker.   Skin: Negative for rash.       Chronic medial rigght buttock pressure ulcer.  Neurological: Positive for dizziness, speech difficulty (sometimes has difficulty with word finding) and weakness. Negative for tremors.       Insomnia, restless legs, improved. Patient reports that he has H/O dizziness. This makes the patient feel unbalanced. Improved. R hand numbness.  Cognitive decline since 2014.  Psychiatric/Behavioral: Positive for sleep disturbance (nocturia x 7). Negative for hallucinations and suicidal ideas. The patient is not nervous/anxious.        Not enough sleep, decreased energy     Vitals:   09/05/15 1043  BP: 110/62  Pulse: 70  Temp: 97.5 F (36.4 C)  TempSrc: Oral  SpO2: 95%  Weight: 187 lb (84.8 kg)  Height: '5\' 10"'  (1.778 m)   Wt Readings from Last 3 Encounters:  09/05/15 187 lb (84.8 kg)  06/22/15 186 lb 6.4 oz (84.6 kg)  06/08/15 184 lb (83.5 kg)    Body mass index is 26.83 kg/m.  Physical Exam  Constitutional: He is oriented to person, place, and time.  Frail.  HENT:  Head: Normocephalic and atraumatic.  Right Ear: External ear normal.  Left Ear: External ear normal.  Eyes: Conjunctivae and EOM are normal. Pupils are equal, round, and reactive to light. Right eye exhibits no discharge. Left eye exhibits no discharge.  Neck: Normal range of motion. Neck supple. No JVD present. No tracheal deviation present. No thyromegaly present.  Cardiovascular:  Murmur heard. 7-9/1 systolic  Pulmonary/Chest: Effort normal and breath sounds normal. No respiratory distress. He has no wheezes. He has no rales.  Abdominal: He exhibits no distension. There is no tenderness.  abd hernia  Genitourinary: No penile tenderness.  Musculoskeletal: Normal range of motion. He exhibits edema (2+ bipedal). He exhibits no tenderness.    Gait disturbance and loss of balance.  Neurological: He is alert and oriented to person, place, and time.  He has normal reflexes. No cranial nerve deficit. He exhibits normal muscle tone. Coordination normal.  Skin: Skin is warm and dry. No rash noted. No erythema.  Pressure ulcer right medial buttock.  Psychiatric: He has a normal mood and affect. His behavior is normal. Judgment and thought content normal.     Labs reviewed: Lab Summary Latest Ref Rng & Units 08/09/2015 04/20/2015 04/06/2015 02/16/2015  Hemoglobin 13.5 - 17.5 g/dL (None) 12.1(A) 12.1(A) 13.2(A)  Hematocrit 41 - 53 % (None) 37(A) 36(A) 40(A)  White count 10:3/mL (None) 5.8 9.6 5.7  Platelet count 150 - 399 K/L (None) 221 220 231  Sodium 137 - 147 mmol/L 142 142 142 139  Potassium 3.4 - 5.3 mmol/L 4.6 4.6 4.6 4.3  Calcium - (None) (None) (None) (None)  Phosphorus - (None) (None) (None) (None)  Creatinine 0.6 - 1.3 mg/dL 7.0(A) 0.9 0.9 0.9  AST 14 - 40 U/L (None) 33 (None) 19  Alk Phos 25 - 125 U/L (None) 54 (None) 59  Bilirubin - (None) (None) (None) (None)  Glucose mg/dL 106 90 89 90  Cholesterol - (None) (None) (None) (None)  HDL cholesterol - (None) (None) (None) (None)  Triglycerides - (None) (None) (None) (None)  LDL Direct - (None) (None) (None) (None)  LDL Calc - (None) (None) (None) (None)  Total protein - (None) (None) (None) (None)  Albumin - (None) (None) (None) (None)  Some recent data might be hidden   Lab Results  Component Value Date   TSH 2.98 04/20/2015   Lab Results  Component Value Date   BUN 18 08/09/2015   BUN 16 04/20/2015   BUN 16 04/06/2015   Lab Results  Component Value Date   CREATININE 7.0 (A) 08/09/2015   CREATININE 0.9 04/20/2015   CREATININE 0.9 04/06/2015   Lab Results  Component Value Date   HGBA1C 5.9 04/20/2015   HGBA1C 5.8 02/16/2015   HGBA1C 5.6 12/15/2012       Assessment/Plan  1. Combined systolic and diastolic congestive heart failure, unspecified  congestive heart failure chronicity ([place]) Compensated  2. Cardiomyopathy, ischemic A drug like Delene Loll might be in his best interest if it were not for his history of postural hypotension.  3. Coronary artery disease with hx of myocardial infarct w/o hx of CABG No recent chest pain  4. Gastroesophageal reflux disease without esophagitis Reduce Protonix to 40 mg nightly. Discontinue Pepcid  5. Hyperlipidemia Continue current medication  6. Essential hypertension Controlled  7. Hypothyroidism, unspecified hypothyroidism type Compensated  8. Idiopathic gout, unspecified chronicity, unspecified site No recent attacks. Remains on colchicine.  9. Insomnia Although patient is using trazodone successfully to help with his insomnia, he continues to have broken sleep related to urinary frequency and nocturia.  10. Pressure ulcer Recommended hydrocolloid bandages  11. Restless leg Continue current medication  12. Urinary frequency Continue tamsulosin. Consider follow-up urinalysis and culture next visit.

## 2015-09-08 ENCOUNTER — Other Ambulatory Visit: Payer: Self-pay | Admitting: Internal Medicine

## 2015-09-20 ENCOUNTER — Ambulatory Visit: Payer: Medicare Other | Admitting: Neurology

## 2015-09-28 ENCOUNTER — Telehealth: Payer: Self-pay

## 2015-09-28 NOTE — Telephone Encounter (Signed)
Patient had an episode last night of feeling dizzy and weak. Patient did not contact nurse at [facility] until this morning.  Patient's vitals are stable @ 96 %, 74 bpm, 132/72. Patient is still very weak.   Please review and advise

## 2015-09-28 NOTE — Telephone Encounter (Signed)
Left message on voicemail with Dr.Green's recommendation

## 2015-09-28 NOTE — Telephone Encounter (Signed)
He already has an appointment to see me next Tuesday if he thinks he can wait that long. Otherwise, he'll need to be worked in on our office schedule or go to an urgent care center.

## 2015-09-29 ENCOUNTER — Other Ambulatory Visit: Payer: Self-pay | Admitting: Internal Medicine

## 2015-09-29 NOTE — Telephone Encounter (Signed)
I called patient, patient states he feels better and will f/u with Dr.Green next week

## 2015-10-01 ENCOUNTER — Other Ambulatory Visit: Payer: Self-pay | Admitting: Internal Medicine

## 2015-10-03 ENCOUNTER — Non-Acute Institutional Stay: Payer: Medicare Other | Admitting: Internal Medicine

## 2015-10-03 ENCOUNTER — Encounter: Payer: Self-pay | Admitting: Internal Medicine

## 2015-10-03 VITALS — BP 106/66 | HR 70 | Temp 97.4°F | Ht 70.0 in | Wt 188.0 lb

## 2015-10-03 DIAGNOSIS — I10 Essential (primary) hypertension: Secondary | ICD-10-CM | POA: Diagnosis not present

## 2015-10-03 DIAGNOSIS — R42 Dizziness and giddiness: Secondary | ICD-10-CM

## 2015-10-03 DIAGNOSIS — G2581 Restless legs syndrome: Secondary | ICD-10-CM

## 2015-10-03 DIAGNOSIS — G4700 Insomnia, unspecified: Secondary | ICD-10-CM

## 2015-10-03 DIAGNOSIS — K5900 Constipation, unspecified: Secondary | ICD-10-CM

## 2015-10-03 DIAGNOSIS — I255 Ischemic cardiomyopathy: Secondary | ICD-10-CM | POA: Diagnosis not present

## 2015-10-03 DIAGNOSIS — I5040 Unspecified combined systolic (congestive) and diastolic (congestive) heart failure: Secondary | ICD-10-CM | POA: Diagnosis not present

## 2015-10-03 MED ORDER — LUBIPROSTONE 24 MCG PO CAPS: ORAL_CAPSULE | ORAL | 11 refills | Status: DC

## 2015-10-03 MED ORDER — TORSEMIDE 20 MG PO TABS: ORAL_TABLET | ORAL | 5 refills | Status: DC

## 2015-10-03 NOTE — Progress Notes (Signed)
Facility  [facility]    Place of Service: [facility] ([facility]     Allergies  Allergen Reactions  . Crestor [Rosuvastatin Calcium]     Unknown reaction   . Altace [Ramipril] Other (See Comments)    Cough  . Bactrim [Sulfamethoxazole-Trimethoprim] Rash  . Penicillins Rash    Has patient had a PCN reaction causing immediate rash, facial/tongue/throat swelling, SOB or lightheadedness with hypotension: No Has patient had a PCN reaction causing severe rash involving mucus membranes or skin necrosis: Yes Has patient had a PCN reaction that required hospitalization Yes Has patient had a PCN reaction occurring within the last 10 years: No If all of the above answers are "NO", then may proceed with Cephalosporin use.   . Sulfa Antibiotics Rash    Chief Complaint  Patient presents with  . Medical Management of Chronic Issues    4-6 week check on blood pressure, GERD, insomnia. Here with wife today  . Dizziness    and weakness in wheel chair today.    HPI:  Continues with dizzy feeling. Not a true vertigo. Seems to be more of an off balance feeling. Present all the time, even when he lays down. Has to be cautious getting up. No nausea.  Symptoms started June 2016. Patient was admitted to the hospital in late October 2016 worsening of the symptoms. He was readmitted to the hospital on Thanksgiving 2016.  Review of his medical records indicates that he was started on amiodarone  July 27, 2014 by Dr. Sallyanne Kuster. A pacemaker download had showed that he had a prolonged episode of sustained ventricular tachycardia with a cycle length of about 400 ms. His pacemaker VT monitor zone was set at 150 bpm and is possible that arrhythmia continue longer than the reported recording. 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 Arrhythmia onset was not associated with a clear trigger.  1.5 years ago he had trembling of the right hand and difficulty holding a fork. It is not as bad now.  Saw Dr. Star Age,  neurologist, 11/22/14. He concluded "he has nonspecific symptoms of lightheadedness and spinning sensation but his history is not supportive of orthostatic hypotension or vertigo or any specific underlying neurological issue. Nevertheless, there are probably some contributors to his symptomatology including medication side effect, lower extremity swelling, not being always fully hydrated, not resting well, normal aging, abnormal posture, and fear of falling  Wakes at night with saliva causing his pillow to be wet. Symptom onset at least a year ago.   Had some headaches in the last week. Used Tylenol 2 times.  Dry hacking cough at night. Sometimes produces a dry mucus.  Has chest congestion and rattling in chest in the morning. Sleeps in a lounge chair to help his breathing.      Medications: Patient's Medications  New Prescriptions   No medications on file  Previous Medications   ACETAMINOPHEN (TYLENOL) 325 MG TABLET    Take 650 mg by mouth every 6 (six) hours as needed for mild pain or moderate pain.   AMIODARONE (PACERONE) 200 MG TABLET    Take two tablets daily for heart rhythm   AMITIZA 8 MCG CAPSULE    Take 8 mcg by mouth daily. To help prevent constipation   ASPIRIN EC 81 MG EC TABLET    Take 1 tablet (81 mg total) by mouth daily.   BISACODYL (DULCOLAX) 5 MG EC TABLET    Take 5 mg by mouth daily as needed for  moderate constipation.   CHOLECALCIFEROL (VITAMIN D3) 5000 UNITS CAPS    Take 1 capsule by mouth every Monday. Reported on 02/22/2015   CLOBETASOL (TEMOVATE) 0.05 % EXTERNAL SOLUTION    Apply 1 application topically. Apply once or twice daily on scaple   CLONAZEPAM (KLONOPIN) 0.5 MG TABLET    Take 1 tablet (0.5 mg total) by mouth at bedtime. Take 1 tablet by mouth every 4 hours as needed for anxiety/restless legs   CLOPIDOGREL (PLAVIX) 75 MG TABLET    Take 75 mg by mouth daily.   COLCHICINE 0.6 MG TABLET    Take one tablet daily for gout   LEVOTHYROXINE (SYNTHROID, LEVOTHROID)  137 MCG TABLET    Take 137 mcg by mouth daily before breakfast.   MAGNESIUM CITRATE SOLN    Take 0.5 Bottles by mouth as needed for mild constipation, moderate constipation or severe constipation.   MIDODRINE (PROAMATINE) 5 MG TABLET    Take 1 tablet (5 mg total) by mouth 3 (three) times daily with meals. Take third dose at least 6 hours before bedtime   PANTOPRAZOLE (PROTONIX) 40 MG TABLET    Take 1 at bedtime to reduce stomach acid   PRAVASTATIN (PRAVACHOL) 40 MG TABLET    Take 40 mg by mouth daily. To lower cholesterol   ROPINIROLE (REQUIP) 0.5 MG TABLET    Take 0.5 mg by mouth every evening. To control restless legs   SENNA (SENOKOT) 8.6 MG TABLET    Take one tablet by mouth every other day at bedtime for constipation   TAMSULOSIN (FLOMAX) 0.4 MG CAPS CAPSULE    One daily to help improve urinary frequency   TRAZODONE (DESYREL) 50 MG TABLET    TAKE 1 TABLET AT BEDTIME AS NEEDED.  Modified Medications   No medications on file  Discontinued Medications   No medications on file     Review of Systems  Constitutional: Positive for fatigue.  HENT: Positive for hearing loss and tinnitus.   Eyes: Negative for photophobia and pain.  Respiratory: Positive for shortness of breath. Negative for cough.   Cardiovascular: Positive for leg swelling (2+ and usingcompression hosiery). Negative for chest pain.       Congestive cardiomyopathy. Coronary artery disease, status post CABG. Cardiac arrhythmias, status post pacemaker implanted left upper chest.  Gastrointestinal: Positive for constipation. Negative for abdominal pain, diarrhea, nausea and vomiting.  Genitourinary: Positive for enuresis and frequency. Negative for hematuria.       Double voids. Urgency. Frequency. Nocturia x 7.  Musculoskeletal: Positive for gait problem (using rolloing walker). Negative for back pain, myalgias and neck pain.       Ambulates with walker.   Skin: Negative for rash.       Chronic medial right buttock pressure  ulcer.  Neurological: Positive for dizziness, speech difficulty (sometimes has difficulty with word finding) and weakness. Negative for tremors.       MRI brain disclosed cerebral atrophy and microvascular disease.  Insomnia and restless legs are improved. Patient reports that he has H/O dizziness. This makes the patient feel unbalanced. R hand numbness.  Cognitive decline since 2014.  Psychiatric/Behavioral: Positive for sleep disturbance (nocturia x 7). Negative for hallucinations and suicidal ideas. The patient is not nervous/anxious.        Not enough sleep, decreased energy     Vitals:   10/03/15 1055  BP: 106/66  Pulse: 70  Temp: 97.4 F (36.3 C)  TempSrc: Oral  SpO2: 96%  Weight: 188 lb (85.3 kg)  Height: _0  (1.778 m)   Wt Readings from Last 3 Encounters:  10/03/15 188 lb (85.3 kg)  09/05/15 187 lb (84.8 kg)  06/22/15 186 lb 6.4 oz (84.6 kg)    Body mass index is 26.98 kg/m.  Physical Exam  Constitutional: He is oriented to person, place, and time.  Frail.  HENT:  Head: Normocephalic and atraumatic.  Right Ear: External ear normal.  Left Ear: External ear normal.  Eyes: Conjunctivae and EOM are normal. Pupils are equal, round, and reactive to light. Right eye exhibits no discharge. Left eye exhibits no discharge.  Neck: Normal range of motion. Neck supple. No JVD present. No tracheal deviation present. No thyromegaly present.  Cardiovascular:  Murmur heard. 4-0/8 systolic  Pulmonary/Chest: Effort normal and breath sounds normal. No respiratory distress. He has no wheezes. He has no rales.  Abdominal: He exhibits no distension. There is no tenderness.  abd hernia  Genitourinary: No penile tenderness.  Musculoskeletal: Normal range of motion. He exhibits edema (2+ bipedal). He exhibits no tenderness.  Gait disturbance and loss of balance. Patient arrives in a motorized wheelchair today.  Neurological: He is alert and oriented to person, place, and time. He  has normal reflexes. No cranial nerve deficit. He exhibits normal muscle tone. Coordination normal.  Skin: Skin is warm and dry. No rash noted. No erythema.  Pressure ulcer right medial buttock.  Psychiatric: He has a normal mood and affect. His behavior is normal. Judgment and thought content normal.     Labs reviewed: Lab Summary Latest Ref Rng & Units 08/09/2015 04/20/2015 04/06/2015  Hemoglobin 13.5 - 17.5 g/dL (None) 12.1(A) 12.1(A)  Hematocrit 41 - 53 % (None) 37(A) 36(A)  White count 10:3/mL (None) 5.8 9.6  Platelet count 150 - 399 K/L (None) 221 220  Sodium 137 - 147 mmol/L 142 142 142  Potassium 3.4 - 5.3 mmol/L 4.6 4.6 4.6  Calcium - (None) (None) (None)  Phosphorus - (None) (None) (None)  Creatinine 0.6 - 1.3 mg/dL 7.0(A) 0.9 0.9  AST 14 - 40 U/L (None) 33 (None)  Alk Phos 25 - 125 U/L (None) 54 (None)  Bilirubin - (None) (None) (None)  Glucose mg/dL 106 90 89  Cholesterol - (None) (None) (None)  HDL cholesterol - (None) (None) (None)  Triglycerides - (None) (None) (None)  LDL Direct - (None) (None) (None)  LDL Calc - (None) (None) (None)  Total protein - (None) (None) (None)  Albumin - (None) (None) (None)  Some recent data might be hidden   Lab Results  Component Value Date   TSH 2.98 04/20/2015   Lab Results  Component Value Date   BUN 18 08/09/2015   BUN 16 04/20/2015   BUN 16 04/06/2015   Lab Results  Component Value Date   CREATININE 7.0 (A) 08/09/2015   CREATININE 0.9 04/20/2015   CREATININE 0.9 04/06/2015   Lab Results  Component Value Date   HGBA1C 5.9 04/20/2015   HGBA1C 5.8 02/16/2015   HGBA1C 5.6 12/15/2012       Assessment/Plan  There are multitude of potential etiologies for his problems of feeling off balance. These feelings have now evolved to a point where he doesn't feel that he can walk independently.  His MRI of the brain has shown microvascular disease and 3 atrophy. Both of these could lead to the difficulty with  balance.  Patient was started on amiodarone in June 2017. As best he can recall, his symptoms of feeling off balance started also at about that  time. Amiodarone was started for a nonsustained ventricular tachycardia. The patient has some palpitations at this time, he is unable to associate the palpitations with his off-balance feeling. I have some concern that the amiodarone may be the culprit in the onset of the symptoms.  1. Cardiomyopathy, ischemic Unchanged  2. Combined systolic and diastolic congestive heart failure, unspecified congestive heart failure chronicity (HCC) Compensated - torsemide (DEMADEX) 20 MG tablet; Take 1 tablets daily if you gain 3 or more pounds weight. Stop when weight returns to normal..  Dispense: 30 tablet; Refill: 5  3. Essential hypertension Controlled  4. Dizziness, after diuretic asscoiated with hypotension and responded to fluid bolus Uncertain etiology. Could be cerebrovascular related or secondary to amiodarone.  5. Restless leg Stable to slightly improved on ropinirole.  6. Insomnia Helped by trazodone  7. Constipation, unspecified constipation type Currently taking 8 mg Amitiza daily. He finds this is not satisfactory and he continues to be constipated. I will increase the dose. - lubiprostone (AMITIZA) 24 MCG capsule; One daily to help prevent constipation  Dispense: 30 capsule; Refill: 11

## 2015-10-03 NOTE — Progress Notes (Signed)
There may indeed be a connection. The amiodarone was increased for an episode of VT that occurred years ago. I suspect that quality of life issues supercede length of life issues in this gentleman at this point.  Can we try reducing the dose to amiodarone 200 mg one tablet every other day? If his next pacemaker check still does not show any ventricular tachycardia, we can then stop altogether.  However, if you think it's important to discontinue it altogether for quality of life, that is not unreasonable. Georgean Spainhower

## 2015-10-04 ENCOUNTER — Telehealth: Payer: Self-pay | Admitting: Internal Medicine

## 2015-10-04 NOTE — Telephone Encounter (Signed)
Telephone call to Mr. Adam [name]. There was no answer so I left a message. I called his home number. Dr. Sallyanne Kuster is in agreement that we can taper and stop the amiodarone. I left directions to take amiodarone every other day for 2 weeks and then discontinue if there is no increase in palpitations.

## 2015-10-06 ENCOUNTER — Other Ambulatory Visit: Payer: Self-pay | Admitting: Internal Medicine

## 2015-10-20 ENCOUNTER — Other Ambulatory Visit: Payer: Self-pay | Admitting: Internal Medicine

## 2015-10-23 ENCOUNTER — Other Ambulatory Visit: Payer: Self-pay | Admitting: *Deleted

## 2015-10-23 MED ORDER — PRAVASTATIN SODIUM 40 MG PO TABS: 40.0000 mg | ORAL_TABLET | Freq: Every day | ORAL | 1 refills | Status: DC

## 2015-10-23 NOTE — Telephone Encounter (Signed)
[facility]  

## 2015-10-29 ENCOUNTER — Other Ambulatory Visit: Payer: Self-pay | Admitting: Internal Medicine

## 2015-10-30 ENCOUNTER — Encounter: Payer: Self-pay | Admitting: Neurology

## 2015-10-30 ENCOUNTER — Ambulatory Visit (INDEPENDENT_AMBULATORY_CARE_PROVIDER_SITE_OTHER): Payer: Medicare Other | Admitting: Neurology

## 2015-10-30 VITALS — BP 110/62 | HR 68 | Resp 16 | Ht 70.0 in | Wt 189.0 lb

## 2015-10-30 DIAGNOSIS — R42 Dizziness and giddiness: Secondary | ICD-10-CM | POA: Diagnosis not present

## 2015-10-30 DIAGNOSIS — R269 Unspecified abnormalities of gait and mobility: Secondary | ICD-10-CM

## 2015-10-30 NOTE — Patient Instructions (Signed)
Try to elevate your legs when you are sitting. Used her compression stockings. Use your walker at all times, try to stay well-hydrated and change positions slowly, turn slowly with a walker.

## 2015-10-30 NOTE — Progress Notes (Signed)
Subjective:    Patient ID: Adam [name] is a 82 y.o. male.  HPI     Interim history:   Adam [name] is a 82 year old right-handed gentleman with an underlying complex medical history of hypothyroidism, melanoma in 1976, heart disease, status post CABG in 1996, 4 vessels, and pacemaker placement secondary to bradycardia in 2010, hypertension, hyperlipidemia, reflux disease, insomnia, overweight state and restless leg symptoms, who presents for follow-up consultation of his dizziness and symptoms of off balance. The patient is accompanied by his wife today. I last saw him on 02/22/2015, at which time he reported doing fairly well. He was in assisted living. He was at [facility] for about a week for rehabilitation. He had adjusted fairly well. He reported no recent falls but had a near fall according to his wife when he started a new bladder medication, this was stopped after 2 days. He was using his 2 wheeled walker. Trazodone was 25 mg each night, insomnia was still a problem however. He was on Requip 0.5 mg each night and did not start gabapentin. His constipation was better. Of note, he presented to the ED with severe constipation, but had a BM there and improved. I reviewed the ED notes from 12/06/14. He again presented to the emergency room on 12/29/2014 with a one-week history of constipation. This required disimpaction in the emergency department. He was admitted to the hospital secondary to urinary retention and discharged on 01/01/2015. I reviewed the hospital records including discharge summary. I suggested he taper of trazodone completely. I suggested he try melatonin at night for sleep. I asked him to use his walker at all times and change positions slowly.   Today, 10/30/2015: He reports doing okay, some swelling both legs, from knees down, uses compression stockings. LE edema fluctuates, takes torsemide about qod. Constipation is under control, senna 2 pills daily. Has occasional chest  tightness at night, no actual pain, no SOB.   Previously:  He missed his appointment on 02/21/15. I first met him on 11/22/2014 at the request of his primary care physician, at which time the patient reported a several year history of intermittent dizziness, worse within the previous 4 months. He also reported dream enactments. I suggested he reduce his trazodone and taper of ropinirole. He reported symptoms of restless leg syndrome. I suggested he try low-dose gabapentin.   11/22/2014: He reports problems with dizziness and his balance. This has been ongoing for years, worse in the last 4 months. He has difficulty sleeping. He has restless leg symptoms. He has been on ropinirole low dose for the past 6 months or so. This was recently increased to 0.5 mg strength 1-2 pills at night. He has been taking 1-1/2 pills for the past couple of weeks. He has been on trazodone for the past month. He has a fever year history of dream activity including yelling out in his sleep and he has even rolled out of bed and hit his head against the night table before. Trazodone helps him sleep but his wife feels that his yelling out episodes have become a little bit more frequent since he has been on trazodone. This is 50 mg each night and he has been on it for a month or so. He is not sure if he has had his vitamin B12 level and vitamin D levels checked recently. He is due for a TSH next month through your office. I reviewed your office note from 11/11/2014 which you kindly included. He sees Dr. Melvyn Novas  in pulmonology and had a recent CT sinuses which was negative for any acute sinusitis. He sees cardiology, Dr. Marland KitchenC". He had a head CT without contrast on 12/10/2013 which showed atrophy and small vessel disease. I personally reviewed the images through the PACS system and also shared some pictures with the patient and his family. He had no significant white matter changes but atrophy may have been a little bit more advanced than  expected for age in my opinion. He reports a sensation of lightheadedness and insecurity when standing up. He feels like he might fall and has had some near falls but thankfully no actual falls. He denies any spinning sensation but sometimes he feels like something is spinning in his head. He denies any headaches. He does not snore or have any significant issues with breathing at night. In the past he has tried melatonin for sleep which was marginally helpful. He has had occasional tingling in his fingertips. He is not diabetic. He does not always drink enough water, his family feels. Of note, he has been on amiodarone. He has had lower extremity swelling for the past several years, essentially since his open heart surgery but in the past 6 months his swelling has become significantly worse per wife.    His Past Medical History Is Significant For: Past Medical History:  Diagnosis Date  . Arthritis    "minor, back and sometimes knees" (12/15/2012)  . Bradycardia    AFib/SSS s/p St Jude PPM 04/12/2008  . CAD (coronary artery disease)    CABG (LIMA-LAD, SVG-RCA, SVG-OM in 1996).  07/2009 BMS to SVG-RCA. Cath in 04/2010 with patent stents   . CHF (congestive heart failure) ([place])   . Exertional shortness of breath    "sometimes walking" (12/15/2012)  . GERD (gastroesophageal reflux disease)   . Heart murmur    "just told I had one today" (12/15/2012)  . Hiatal hernia   . Hyperlipidemia   . Hypertension   . Hypothyroid   . Insomnia   . Melanoma of back ([place]) 1976  . Myocardial infarction [facility]) 1996; 2011   "both silent" (12/15/2012)  . Osteoporosis, senile   . Pacemaker   . RBBB   . [facility] spotted fever   . S/P CABG x 4   . Sustained ventricular tachycardia ([place]) 07/27/2014    His Past Surgical History Is Significant For: Past Surgical History:  Procedure Laterality Date  . CARDIAC CATHETERIZATION  04/2010   LIMA to LAD patent,SVG to OM patent,no in-stnet restenosis RCA  .  CATARACT EXTRACTION W/ INTRAOCULAR LENS  IMPLANT, BILATERAL Bilateral 2012  . CORONARY ANGIOPLASTY WITH STENT PLACEMENT  07/2009   bare metal stent to SVG to the RCA  . CORONARY ARTERY BYPASS GRAFT  1996   LIMA to LAD,SVG to RCA & SVG to OM  . INSERT / REPLACE / REMOVE PACEMAKER  2010  . MELANOMA EXCISION  05/1974 X2   "taken off my back" (12/15/2012)  . NM MYOVIEW LTD  06/2011   low risk  . TONSILLECTOMY  1938  . TRANSURETHRAL RESECTION OF PROSTATE  1986  . US ECHOCARDIOGRAPHY  07/11/2009   EF 45-50%    His Family History Is Significant For: Family History  Problem Relation Age of Onset  . Coronary artery disease Mother   . Diabetes Mother   . Heart disease Mother   . Coronary artery disease Father   . Diabetes Father   . Lung cancer Father   . Heart disease Father   .  Anuerysm Son   . Heart disease Brother     His Social History Is Significant For: Social History   Social History  . Marital status: Married    Spouse name: N/A  . Number of children: 2  . Years of education: Masters   Occupational History  . Retired Company secretary -Pensions consultant    Social History Main Topics  . Smoking status: Never Smoker  . Smokeless tobacco: Never Used  . Alcohol use No  . Drug use: No  . Sexual activity: No   Other Topics Concern  . None   Social History Narrative   Lives at [place] to [place] 01/09/15   Married - Violet   Never smoked   Alcohol none   Previously employed as Scientist, forensic for [employer].         Diet:Low sodium   Do you drink/eat things with caffeine? No   Marital status: Married                              What year were you married?1950   Do you live in a house, apartment, assisted living, condo, trailer, etc)?    Is it one or more stories? 1   How many persons live in your home? 2   Do you have any pets in your home? No   Current or past profession: Minister, [employer]   Do you exercise?     Very Little                                                  Type & how often:    Do you have a living will?  Yes   Do you have a DNR Form? Yes   Do you have a POA/HPOA forms? Yes    His Allergies Are:  Allergies  Allergen Reactions  . Crestor [Rosuvastatin Calcium]     Unknown reaction   . Altace [Ramipril] Other (See Comments)    Cough  . Bactrim [Sulfamethoxazole-Trimethoprim] Rash  . Penicillins Rash    Has patient had a PCN reaction causing immediate rash, facial/tongue/throat swelling, SOB or lightheadedness with hypotension: No Has patient had a PCN reaction causing severe rash involving mucus membranes or skin necrosis: Yes Has patient had a PCN reaction that required hospitalization Yes Has patient had a PCN reaction occurring within the last 10 years: No If all of the above answers are "NO", then may proceed with Cephalosporin use.   . Sulfa Antibiotics Rash  :   His Current Medications Are:  Outpatient Encounter Prescriptions as of 10/30/2015  Medication Sig  . aspirin EC 81 MG EC tablet Take 1 tablet (81 mg total) by mouth daily.  . bisacodyl (DULCOLAX) 5 MG EC tablet Take 5 mg by mouth daily as needed for moderate constipation.  . Cholecalciferol (VITAMIN D3) 5000 UNITS CAPS Take 1 capsule by mouth every Monday. Reported on 02/22/2015  . clobetasol (TEMOVATE) 0.05 % external solution Apply 1 application topically. Apply once or twice daily on scaple  . clonazePAM (KLONOPIN) 0.5 MG tablet Take 1 tablet (0.5 mg total) by mouth at bedtime. Take 1 tablet by mouth every 4 hours as needed for anxiety/restless legs  . clopidogrel (PLAVIX) 75 MG tablet Take 75 mg by mouth daily.  Marland Kitchen  colchicine 0.6 MG tablet Take one tablet daily for gout  . lubiprostone (AMITIZA) 8 MCG capsule Take 8 mcg by mouth 2 (two) times daily with a meal.  . midodrine (PROAMATINE) 5 MG tablet Take 1 tablet (5 mg total) by mouth 3 (three) times daily with meals. Take third dose at least 6 hours before bedtime  . pantoprazole  (PROTONIX) 40 MG tablet Take 1 at bedtime to reduce stomach acid  . pravastatin (PRAVACHOL) 40 MG tablet Take 1 tablet (40 mg total) by mouth daily. To lower cholesterol  . rOPINIRole (REQUIP) 0.5 MG tablet Take 0.5 mg by mouth every evening. To control restless legs  . tamsulosin (FLOMAX) 0.4 MG CAPS capsule TAKE ONE CAPSULE DAILY TO HELP IMPROVE URINARY FREQUENCY.  Marland Kitchen torsemide (DEMADEX) 20 MG tablet Take 1 tablets daily if you gain 3 or more pounds weight. Stop when weight returns to normal..  . traZODone (DESYREL) 50 MG tablet TAKE 1 TABLET AT BEDTIME AS NEEDED.  Marland Kitchen amiodarone (PACERONE) 200 MG tablet Take two tablets daily for heart rhythm  . levothyroxine (SYNTHROID, LEVOTHROID) 137 MCG tablet Take 137 mcg by mouth daily before breakfast.  . [DISCONTINUED] acetaminophen (TYLENOL) 325 MG tablet Take 650 mg by mouth every 6 (six) hours as needed for mild pain or moderate pain.  . [DISCONTINUED] lubiprostone (AMITIZA) 24 MCG capsule One daily to help prevent constipation  . [DISCONTINUED] magnesium citrate SOLN Take 0.5 Bottles by mouth as needed for mild constipation, moderate constipation or severe constipation.  . [DISCONTINUED] senna (SENOKOT) 8.6 MG tablet Take one tablet by mouth every other day at bedtime for constipation   No facility-administered encounter medications on file as of 10/30/2015.   :  Review of Systems:  Out of a complete 14 point review of systems, all are reviewed and negative with the exception of these symptoms as listed below:  Review of Systems  Cardiovascular:       Patient reports that he is having a lot of swelling in his legs. He is currently in an assisted living facility, [facility]. Wife states that their food is salty which they are not use too.   Neurological:       Wife is not sure if patient is currently taking Amiodarone and Levothyroxine.     Objective:  Neurologic Exam  Physical Exam Physical Examination:   Vitals:   10/30/15 1517   BP: 110/62  Pulse: 68  Resp: 16    General Examination: The patient is a very pleasant 82 y.o. male in no acute distress. He appears well-developed and well-nourished and very well groomed. He is in good spirits today. No dizziness upon standing.   HEENT: Normocephalic, atraumatic, pupils are equal, round and reactive to light and accommodation. Extraocular tracking is good without limitation to gaze excursion or nystagmus noted. Normal smooth pursuit is noted. Hearing is grossly intact. Face is symmetric with normal facial animation and normal facial sensation. Speech is clear with no dysarthria noted. There is no hypophonia. There is no lip, neck/head, jaw or voice tremor. Neck is perhaps slightly rigid, no jaw tremor. Oropharynx exam reveals: mild mouth dryness, adequate dental hygiene and mild airway crowding.  He has several missing teeth, he is not wearing his partials. Mallampati is class II. Tongue protrudes centrally and palate elevates symmetrically.   Chest: Clear to auscultation without wheezing, rhonchi or crackles noted.  Heart: S1+S2+0, regular and normal without murmurs, rubs or gallops noted.   Abdomen: Soft, non-tender and non-distended with  normal bowel sounds appreciated on auscultation.  Extremities: There is 2+ pitting edema in the distal lower extremities bilaterally right side more prominent than left, wears compression stockings up to knees b/l.  Skin: Warm and dry without trophic changes noted. There are no varicose veins. Bruising noted right forearm.  Musculoskeletal: exam reveals no obvious joint deformities, tenderness or joint swelling or erythema.   Neurologically:  Mental status: The patient is awake, alert and oriented in all 4 spheres. His immediate and remote memory, attention, language skills and fund of knowledge are appropriate. There is no evidence of aphasia, agnosia, apraxia or anomia. Speech is clear with normal prosody and enunciation. Thought  process is linear. Mood is normal and affect is normal.  Cranial nerves II - XII are as described above under HEENT exam. In addition: shoulder shrug is normal with equal shoulder height noted. Motor exam: Normal bulk, strength for age and tone is noted, but  He does appear to have a hard time relaxing. There is no drift, tremor or rebound. Reflexes are 1+ throughout. Fine motor skills and coordination are mildly impaired globally. Heel-to-shin is not possible. He stands up slowly and has to push himself up. He takes several seconds to get his bearing, no lightheadedness reported. Romberg is not possible to do as he cannot stand without using his walker or holding onto something. He uses his 4 wheeled walker. Cerebellar testing: No dysmetria or intention tremor on finger to nose testing.  Sensory exam: intact to light touch in the upper and lower extremities, somewhat decreased for vibration in the distal lower extremities but this could be confounded by the swelling.           Assessment and Plan:   In summary, Adam [name] is a very pleasant 82 year old male with an underlying complex medical history of hypothyroidism, melanoma in 1976, heart disease, status post Four-vessel CABG in 1996, s/p pacemaker placement secondary to bradycardia in 2010, hypertension, orthostatic hypotension, hyperlipidemia, reflux disease, insomnia, overweight state and restless leg symptoms, who presents for follow-up consultation of his dizziness and balance problems. His exam is stable. He has been using a 4 wheeled walker. He is advised to stay well-hydrated and change positions slowly and uses walker for gait safety at all times. He is advised to continue to use compression stockings not just for his orthostatic hypotension but also for lower extremity swelling. We talked about trying to eliminate and limit sedating medications as much is possible.  He continues to take low-dose ropinirole. I did not suggest any changes in  that regard today. I would like to see him back in 6 months, sooner if needed. I answered all her questions today and the patient and his wife were in agreement. I spent 25 minutes in total face-to-face time with the patient, more than 50% of which was spent in counseling and coordination of care, reviewing test results, reviewing medication and discussing or reviewing the diagnosis of dizziness, gait d/o, the prognosis and treatment options.

## 2015-11-11 ENCOUNTER — Other Ambulatory Visit: Payer: Self-pay | Admitting: Internal Medicine

## 2015-11-25 ENCOUNTER — Other Ambulatory Visit: Payer: Self-pay | Admitting: Internal Medicine

## 2015-11-27 LAB — BASIC METABOLIC PANEL
BUN: 13 mg/dL (ref 4–21)
Creatinine: 0.9 mg/dL (ref 0.6–1.3)
Glucose: 89 mg/dL
Potassium: 4.3 mmol/L (ref 3.4–5.3)
Sodium: 141 mmol/L (ref 137–147)

## 2015-11-27 LAB — CBC AND DIFFERENTIAL
HCT: 44 % (ref 41–53)
Hemoglobin: 14.8 g/dL (ref 13.5–17.5)
Platelets: 212 10*3/uL (ref 150–399)
WBC: 6.5 10^3/mL

## 2015-11-27 LAB — LIPID PANEL
Cholesterol: 97 mg/dL (ref 0–200)
HDL: 53 mg/dL (ref 35–70)
LDL Cholesterol: 25 mg/dL
Triglycerides: 97 mg/dL (ref 40–160)

## 2015-11-27 LAB — HEPATIC FUNCTION PANEL
ALT: 20 U/L (ref 10–40)
AST: 23 U/L (ref 14–40)
Alkaline Phosphatase: 65 U/L (ref 25–125)
Bilirubin, Total: 0.5 mg/dL

## 2015-11-27 LAB — TSH: TSH: 2.58 u[IU]/mL (ref 0.41–5.90)

## 2015-11-28 ENCOUNTER — Encounter: Payer: Self-pay | Admitting: *Deleted

## 2015-12-05 ENCOUNTER — Encounter: Payer: Medicare Other | Admitting: Internal Medicine

## 2015-12-06 ENCOUNTER — Encounter (HOSPITAL_COMMUNITY): Payer: Self-pay | Admitting: *Deleted

## 2015-12-06 ENCOUNTER — Telehealth: Payer: Self-pay | Admitting: Cardiovascular Disease

## 2015-12-06 ENCOUNTER — Emergency Department (HOSPITAL_COMMUNITY): Payer: Medicare Other

## 2015-12-06 ENCOUNTER — Observation Stay (HOSPITAL_COMMUNITY)
Admission: EM | Admit: 2015-12-06 | Discharge: 2015-12-08 | Disposition: A | Discharge status: Home Health | Payer: Medicare Other | Attending: Family Medicine | Admitting: Family Medicine

## 2015-12-06 DIAGNOSIS — E785 Hyperlipidemia, unspecified: Secondary | ICD-10-CM | POA: Diagnosis not present

## 2015-12-06 DIAGNOSIS — I495 Sick sinus syndrome: Secondary | ICD-10-CM | POA: Diagnosis not present

## 2015-12-06 DIAGNOSIS — Z950 Presence of cardiac pacemaker: Secondary | ICD-10-CM | POA: Diagnosis present

## 2015-12-06 DIAGNOSIS — I2510 Atherosclerotic heart disease of native coronary artery without angina pectoris: Secondary | ICD-10-CM | POA: Diagnosis not present

## 2015-12-06 DIAGNOSIS — I472 Ventricular tachycardia: Secondary | ICD-10-CM | POA: Insufficient documentation

## 2015-12-06 DIAGNOSIS — I255 Ischemic cardiomyopathy: Secondary | ICD-10-CM | POA: Diagnosis not present

## 2015-12-06 DIAGNOSIS — F0390 Unspecified dementia without behavioral disturbance: Secondary | ICD-10-CM | POA: Diagnosis not present

## 2015-12-06 DIAGNOSIS — R29898 Other symptoms and signs involving the musculoskeletal system: Secondary | ICD-10-CM

## 2015-12-06 DIAGNOSIS — I252 Old myocardial infarction: Secondary | ICD-10-CM | POA: Diagnosis not present

## 2015-12-06 DIAGNOSIS — Z7982 Long term (current) use of aspirin: Secondary | ICD-10-CM | POA: Diagnosis not present

## 2015-12-06 DIAGNOSIS — Z79899 Other long term (current) drug therapy: Secondary | ICD-10-CM | POA: Insufficient documentation

## 2015-12-06 DIAGNOSIS — Z7902 Long term (current) use of antithrombotics/antiplatelets: Secondary | ICD-10-CM | POA: Diagnosis not present

## 2015-12-06 DIAGNOSIS — I5040 Unspecified combined systolic (congestive) and diastolic (congestive) heart failure: Secondary | ICD-10-CM | POA: Diagnosis not present

## 2015-12-06 DIAGNOSIS — K5900 Constipation, unspecified: Secondary | ICD-10-CM | POA: Diagnosis not present

## 2015-12-06 DIAGNOSIS — E039 Hypothyroidism, unspecified: Secondary | ICD-10-CM | POA: Insufficient documentation

## 2015-12-06 DIAGNOSIS — I10 Essential (primary) hypertension: Secondary | ICD-10-CM

## 2015-12-06 DIAGNOSIS — I5042 Chronic combined systolic (congestive) and diastolic (congestive) heart failure: Secondary | ICD-10-CM | POA: Insufficient documentation

## 2015-12-06 DIAGNOSIS — F418 Other specified anxiety disorders: Secondary | ICD-10-CM | POA: Insufficient documentation

## 2015-12-06 DIAGNOSIS — R531 Weakness: Principal | ICD-10-CM | POA: Insufficient documentation

## 2015-12-06 DIAGNOSIS — I480 Paroxysmal atrial fibrillation: Secondary | ICD-10-CM | POA: Diagnosis not present

## 2015-12-06 DIAGNOSIS — Z85820 Personal history of malignant melanoma of skin: Secondary | ICD-10-CM | POA: Diagnosis not present

## 2015-12-06 DIAGNOSIS — Z955 Presence of coronary angioplasty implant and graft: Secondary | ICD-10-CM | POA: Insufficient documentation

## 2015-12-06 DIAGNOSIS — I951 Orthostatic hypotension: Secondary | ICD-10-CM | POA: Diagnosis not present

## 2015-12-06 DIAGNOSIS — K219 Gastro-esophageal reflux disease without esophagitis: Secondary | ICD-10-CM | POA: Diagnosis not present

## 2015-12-06 DIAGNOSIS — R4701 Aphasia: Secondary | ICD-10-CM | POA: Diagnosis not present

## 2015-12-06 DIAGNOSIS — I639 Cerebral infarction, unspecified: Secondary | ICD-10-CM

## 2015-12-06 DIAGNOSIS — I110 Hypertensive heart disease with heart failure: Secondary | ICD-10-CM | POA: Insufficient documentation

## 2015-12-06 DIAGNOSIS — Z951 Presence of aortocoronary bypass graft: Secondary | ICD-10-CM | POA: Diagnosis not present

## 2015-12-06 DIAGNOSIS — R0600 Dyspnea, unspecified: Secondary | ICD-10-CM

## 2015-12-06 DIAGNOSIS — I6340 Cerebral infarction due to embolism of unspecified cerebral artery: Secondary | ICD-10-CM

## 2015-12-06 HISTORY — DX: Other symptoms and signs involving the musculoskeletal system: R29.898

## 2015-12-06 HISTORY — DX: Cerebral infarction due to embolism of unspecified cerebral artery: I63.40

## 2015-12-06 LAB — COMPREHENSIVE METABOLIC PANEL
ALT: 24 U/L (ref 17–63)
AST: 27 U/L (ref 15–41)
Albumin: 3.4 g/dL — ABNORMAL LOW (ref 3.5–5.0)
Alkaline Phosphatase: 60 U/L (ref 38–126)
Anion gap: 6 (ref 5–15)
BUN: 12 mg/dL (ref 6–20)
CO2: 28 mmol/L (ref 22–32)
Calcium: 9 mg/dL (ref 8.9–10.3)
Chloride: 105 mmol/L (ref 101–111)
Creatinine, Ser: 0.85 mg/dL (ref 0.61–1.24)
GFR calc Af Amer: >60 mL/min (ref >60)
GFR calc non Af Amer: >60 mL/min (ref >60)
Glucose, Bld: 114 mg/dL — ABNORMAL HIGH (ref 65–99)
Potassium: 4 mmol/L (ref 3.5–5.1)
Sodium: 139 mmol/L (ref 135–145)
Total Bilirubin: 0.5 mg/dL (ref 0.3–1.2)
Total Protein: 6.2 g/dL — ABNORMAL LOW (ref 6.5–8.1)

## 2015-12-06 LAB — URINALYSIS, ROUTINE W REFLEX MICROSCOPIC
Bilirubin Urine: NEGATIVE
Glucose, UA: NEGATIVE mg/dL
Hgb urine dipstick: NEGATIVE
Ketones, ur: NEGATIVE mg/dL
Leukocytes, UA: NEGATIVE
Nitrite: NEGATIVE
Protein, ur: NEGATIVE mg/dL
Specific Gravity, Urine: 1.008 (ref 1.005–1.030)
pH: 7 (ref 5.0–8.0)

## 2015-12-06 LAB — RAPID URINE DRUG SCREEN, HOSP PERFORMED
Amphetamines: NOT DETECTED
Barbiturates: NOT DETECTED
Benzodiazepines: NOT DETECTED
Cocaine: NOT DETECTED
Opiates: NOT DETECTED
Tetrahydrocannabinol: NOT DETECTED

## 2015-12-06 LAB — DIFFERENTIAL
Basophils Absolute: 0 10*3/uL (ref 0.0–0.1)
Basophils Relative: 1 %
Eosinophils Absolute: 0.2 10*3/uL (ref 0.0–0.7)
Eosinophils Relative: 3 %
Lymphocytes Relative: 28 %
Lymphs Abs: 1.5 10*3/uL (ref 0.7–4.0)
Monocytes Absolute: 0.5 10*3/uL (ref 0.1–1.0)
Monocytes Relative: 9 %
Neutro Abs: 3.3 10*3/uL (ref 1.7–7.7)
Neutrophils Relative %: 59 %

## 2015-12-06 LAB — CBC
HCT: 41.4 % (ref 39.0–52.0)
Hemoglobin: 13.5 g/dL (ref 13.0–17.0)
MCH: 32.1 pg (ref 26.0–34.0)
MCHC: 32.6 g/dL (ref 30.0–36.0)
MCV: 98.6 fL (ref 78.0–100.0)
Platelets: 183 10*3/uL (ref 150–400)
RBC: 4.2 MIL/uL — ABNORMAL LOW (ref 4.22–5.81)
RDW: 14.6 % (ref 11.5–15.5)
WBC: 5.4 10*3/uL (ref 4.0–10.5)

## 2015-12-06 LAB — ETHANOL: Alcohol, Ethyl (B): <5 mg/dL (ref <5)

## 2015-12-06 LAB — I-STAT TROPONIN, ED: Troponin i, poc: 0.01 ng/mL (ref 0.00–0.08)

## 2015-12-06 LAB — APTT: aPTT: 34 seconds (ref 24–36)

## 2015-12-06 LAB — PROTIME-INR
INR: 1.05
Prothrombin Time: 13.7 seconds (ref 11.4–15.2)

## 2015-12-06 MED ORDER — TRAZODONE HCL 50 MG PO TABS
50.0000 mg | ORAL_TABLET | Freq: Every evening | ORAL | Status: DC | PRN
  Administered 2015-12-06 – 2015-12-07 (×2): 50 mg via ORAL
  Filled 2015-12-06 (×2): qty 1

## 2015-12-06 MED ORDER — ROPINIROLE HCL 0.5 MG PO TABS
0.5000 mg | ORAL_TABLET | Freq: Once | ORAL | Status: AC
  Administered 2015-12-06: 0.5 mg via ORAL
  Filled 2015-12-06: qty 1

## 2015-12-06 NOTE — ED Notes (Signed)
Pt came in with weakness as per pt. To R leg.  Pt was initially thought of as potential for a code stroke though after Neuro Eval. Code stroke was cancelled and neuro felt that this stemmed from pt's lower spine and was not brain related.  CT was completed of both head and spine and were negative.  Neuro checks were completed as a precaution and were all negative.  Swallow eval. Was completed and pt. Passed and was given a sandwich.  Vitals have been WNL, pt is awake alert and oriented is ambulatory with a walker that he has at bedside. Labs were relatively unremarkable.  Pt has a 22G IV to R wrist area he has not received any medication through IV though did receive ReQuip for restless leg pain.  Pt is comfortable and in no distress

## 2015-12-06 NOTE — H&P (Signed)
Adam [name] [id number] DOB: 1926-09-30 DOA: 12/06/2015     PCP: Jeanmarie Hubert, MD   Outpatient Specialists: Neurology [facility], Cardiology [facility] Patient coming from:  From facility [facility] [place]  Chief Complaint:  Leg weaknes  HPI: Adam [name] is a 82 y.o. male with medical history significant of congestive cardiomyopathy, combined systolic and diastolic heart failure, CAD, HTN, postural hypotension, intermittent weakness for which she's been followed by neurology with cerebral atrophy and cerebellar atrophy on CT scan, GERD and constipation, RBBB  Presented with right lower extremity weakness since noon.  He was standing in line waiting for food and his right leg went numb he had to sit down. The numbness lasted for at least 4 hours. He has intermittent dizziness but this was not happening at the time. No chest pain.   He sometimes have intermittent weakness currently states the weakness has resolved. and has trouble holding fork this has been chronic and he has been seen by neurology  For this. He denies any chest pain or shortness of breath. Leg weakness was associated with tingling. He reports restless leg syndrome bothering him currently otherwise he feels well. He had some minimal back pain associated but doing better.  Regarding pertinent Chronic problems: Known history of coronary artery disease status post CABG in 1996 and cardiac cath in 2011 with bare metal stent to SVG in the RCA cardiac catheterization 2012 showing patent stents   IN ER:  Temp (24hrs), Avg:98 F (36.7 C), Min:97.6 F (36.4 C), Max:98.6 F (37 C)  RR 20 98% BP 132/74 Trop 0.01 INR 1.05 WBC 5.4   CT lumbar spine showing suspected transitional anatomy of lumbar sacral spine degenerative changes. CT head: Negative    Following Medications were ordered in ER: Medications  rOPINIRole (REQUIP) tablet 0.5 mg (0.5 mg Oral Given 12/06/15 2151)     ER provider discussed case  with: Neurology Cristobal Goldmann last seen patient in consult recommends repeat CT next 1-2days  Hospitalist was called for admission for right  leg weakness  Review of Systems:    Pertinent positives include:  localizing neurological complaints, tingling, weakness,  Constitutional:  No weight loss, night sweats, Fevers, chills, fatigue, weight loss  HEENT:  No headaches, Difficulty swallowing,Tooth/dental problems,Sore throat,  No sneezing, itching, ear ache, nasal congestion, post nasal drip,  Cardio-vascular:  No chest pain, Orthopnea, PND, anasarca, dizziness, palpitations.no Bilateral lower extremity swelling  GI:  No heartburn, indigestion, abdominal pain, nausea, vomiting, diarrhea, change in bowel habits, loss of appetite, melena, blood in stool, hematemesis Resp:  no shortness of breath at rest. No dyspnea on exertion, No excess mucus, no productive cough, No non-productive cough, No coughing up of blood.No change in color of mucus.No wheezing. Skin:  no rash or lesions. No jaundice GU:  no dysuria, change in color of urine, no urgency or frequency. No straining to urinate.  No flank pain.  Musculoskeletal:  No joint pain or no joint swelling. No decreased range of motion. No back pain.  Psych:  No change in mood or affect. No depression or anxiety. No memory loss.  Neuro: no double vision, no gait abnormality, no slurred speech, no confusion  As per HPI otherwise 10 point review of systems negative.   Past Medical History: Past Medical History:  Diagnosis Date  . Arthritis    "minor, back and sometimes knees" (12/15/2012)  . Bradycardia    AFib/SSS s/p St Jude PPM 04/12/2008  . CAD (coronary artery disease)  CABG (LIMA-LAD, SVG-RCA, SVG-OM in 1996).  07/2009 BMS to SVG-RCA. Cath in 04/2010 with patent stents   . CHF (congestive heart failure) ([place])   . Exertional shortness of breath    "sometimes walking" (12/15/2012)  . GERD (gastroesophageal reflux disease)   . Heart  murmur    "just told I had one today" (12/15/2012)  . Hiatal hernia   . Hyperlipidemia   . Hypertension   . Hypothyroid   . Insomnia   . Melanoma of back ([place]) 1976  . Myocardial infarction 1996; 2011   "both silent" (12/15/2012)  . Osteoporosis, senile   . Pacemaker   . RBBB   . [facility] spotted fever   . S/P CABG x 4   . Sustained ventricular tachycardia ([place]) 07/27/2014   Past Surgical History:  Procedure Laterality Date  . CARDIAC CATHETERIZATION  04/2010   LIMA to LAD patent,SVG to OM patent,no in-stnet restenosis RCA  . CATARACT EXTRACTION W/ INTRAOCULAR LENS  IMPLANT, BILATERAL Bilateral 2012  . CORONARY ANGIOPLASTY WITH STENT PLACEMENT  07/2009   bare metal stent to SVG to the RCA  . CORONARY ARTERY BYPASS GRAFT  1996   LIMA to LAD,SVG to RCA & SVG to OM  . INSERT / REPLACE / REMOVE PACEMAKER  2010  . MELANOMA EXCISION  05/1974 X2   "taken off my back" (12/15/2012)  . NM MYOVIEW LTD  06/2011   low risk  . TONSILLECTOMY  1938  . TRANSURETHRAL RESECTION OF PROSTATE  1986  . US ECHOCARDIOGRAPHY  07/11/2009   EF 45-50%     Social History:  Ambulatory  With a walker       reports that he has never smoked. He has never used smokeless tobacco. He reports that he does not drink alcohol or use drugs.  Allergies:   Allergies  Allergen Reactions  . Crestor [Rosuvastatin Calcium]     Unknown reaction   . Altace [Ramipril] Other (See Comments)    Cough  . Bactrim [Sulfamethoxazole-Trimethoprim] Rash  . Penicillins Rash    Has patient had a PCN reaction causing immediate rash, facial/tongue/throat swelling, SOB or lightheadedness with hypotension: No Has patient had a PCN reaction causing severe rash involving mucus membranes or skin necrosis: Yes Has patient had a PCN reaction that required hospitalization Yes Has patient had a PCN reaction occurring within the last 10 years: No If all of the above answers are "NO", then may proceed with Cephalosporin use.   .  Sulfa Antibiotics Rash       Family History:   Family History  Problem Relation Age of Onset  . Coronary artery disease Mother   . Diabetes Mother   . Heart disease Mother   . Coronary artery disease Father   . Diabetes Father   . Lung cancer Father   . Heart disease Father   . Anuerysm Son   . Heart disease Brother     Medications: Prior to Admission medications   Medication Sig Start Date End Date Taking? Authorizing Provider  amiodarone (PACERONE) 200 MG tablet Take two tablets daily for heart rhythm 08/07/15  Yes Tiffany L Reed, DO  aspirin EC 81 MG EC tablet Take 1 tablet (81 mg total) by mouth daily. 12/16/12  Yes Isaiah Serge, NP  bisacodyl (DULCOLAX) 5 MG EC tablet Take 5 mg by mouth daily as needed for moderate constipation.   Yes Historical Provider, MD  Cholecalciferol (VITAMIN D3) 5000 UNITS CAPS Take 1 capsule by mouth every Monday.  Reported on 02/22/2015   Yes Historical Provider, MD  clobetasol (TEMOVATE) 0.05 % external solution Apply 1 application topically. Apply once or twice daily on scaple   Yes Historical Provider, MD  clonazePAM (KLONOPIN) 0.5 MG tablet Take 1 tablet (0.5 mg total) by mouth at bedtime. Take 1 tablet by mouth every 4 hours as needed for anxiety/restless legs 07/27/15  Yes Tiffany L Reed, DO  clopidogrel (PLAVIX) 75 MG tablet Take 75 mg by mouth daily.   Yes Historical Provider, MD  levothyroxine (SYNTHROID, LEVOTHROID) 137 MCG tablet Take 137 mcg by mouth daily before breakfast.   Yes Historical Provider, MD  lubiprostone (AMITIZA) 8 MCG capsule Take 8 mcg by mouth 2 (two) times daily with a meal.   Yes Historical Provider, MD  midodrine (PROAMATINE) 5 MG tablet Take 1 tablet (5 mg total) by mouth 3 (three) times daily with meals. Take third dose at least 6 hours before bedtime 03/16/15  Yes Mihai Croitoru, MD  pantoprazole (PROTONIX) 40 MG tablet Take 1 at bedtime to reduce stomach acid 09/05/15  Yes Estill Dooms, MD  pravastatin (PRAVACHOL) 40 MG  tablet Take 1 tablet (40 mg total) by mouth daily. To lower cholesterol 10/23/15  Yes Estill Dooms, MD  rOPINIRole (REQUIP) 0.5 MG tablet Take 0.5 mg by mouth every evening. To control restless legs 12/25/14  Yes Historical Provider, MD  tamsulosin (FLOMAX) 0.4 MG CAPS capsule TAKE ONE CAPSULE DAILY TO HELP IMPROVE URINARY FREQUENCY. 11/27/15  Yes Estill Dooms, MD  traZODone (DESYREL) 50 MG tablet TAKE 1 TABLET AT BEDTIME AS NEEDED. 11/13/15  Yes Estill Dooms, MD  colchicine 0.6 MG tablet Take one tablet daily for gout Patient not taking: Reported on 12/06/2015 08/07/15   Tiffany L Reed, DO  torsemide (DEMADEX) 20 MG tablet Take 1 tablets daily if you gain 3 or more pounds weight. Stop when weight returns to normal.. Patient not taking: Reported on 12/06/2015 10/03/15   Estill Dooms, MD    Physical Exam: Patient Vitals for the past 24 hrs:  BP Temp Temp src Pulse Resp SpO2 Height Weight  12/06/15 2149 132/74 97.9 F (36.6 C) - 69 20 98 % - -  12/06/15 2148 - 98.6 F (37 C) - - - - - -  12/06/15 1900 138/78 - - 69 17 98 % - -  12/06/15 1813 137/80 97.6 F (36.4 C) Oral 70 22 98 % - -  12/06/15 1633 - - - - - - 5\' 11"  (1.803 m) 81.6 kg (180 lb)  12/06/15 1631 103/57 97.7 F (36.5 C) Oral 70 16 95 % - -    1. General:  in No Acute distress 2. Psychological: Alert and  Oriented 3. Head/ENT:     Dry Mucous Membranes                          Head Non traumatic, neck supple                            Poor Dentition 4. SKIN: normal  Skin turgor,  Skin clean Dry and intact no rash 5. Heart: Regular rate and rhythm  Murmur, Rub or gallop 6. Lungs:   no wheezes or crackles   7. Abdomen: Soft,  non-tender, Non distended 8. Lower extremities: no clubbing, cyanosis,trace  edema 9. Neurologically  strength 3 out of 5 on right lower extremity and 5/5 in  all other extremities cranial nerves II through XII intact 10. MSK: Normal range of motion   body mass index is 25.1 kg/m.  Labs on  Admission:   Labs on Admission: I have personally reviewed following labs and imaging studies  CBC:  Recent Labs Lab 12/06/15 1617  WBC 5.4  NEUTROABS 3.3  HGB 13.5  HCT 41.4  MCV 98.6  PLT [id number]   Basic Metabolic Panel:  Recent Labs Lab 12/06/15 1617  NA 139  K 4.0  CL 105  CO2 28  GLUCOSE 114*  BUN 12  CREATININE 0.85  CALCIUM 9.0   GFR: Estimated Creatinine Clearance: 66.4 mL/min (by C-G formula based on SCr of 0.85 mg/dL). Liver Function Tests:  Recent Labs Lab 12/06/15 1617  AST 27  ALT 24  ALKPHOS 60  BILITOT 0.5  PROT 6.2*  ALBUMIN 3.4*   No results for input(s): LIPASE, AMYLASE in the last 168 hours. No results for input(s): AMMONIA in the last 168 hours. Coagulation Profile:  Recent Labs Lab 12/06/15 1617  INR 1.05   Cardiac Enzymes: No results for input(s): CKTOTAL, CKMB, CKMBINDEX, TROPONINI in the last 168 hours. BNP (last 3 results) No results for input(s): PROBNP in the last 8760 hours. HbA1C: No results for input(s): HGBA1C in the last 72 hours. CBG: No results for input(s): GLUCAP in the last 168 hours. Lipid Profile: No results for input(s): CHOL, HDL, LDLCALC, TRIG, CHOLHDL, LDLDIRECT in the last 72 hours. Thyroid Function Tests: No results for input(s): TSH, T4TOTAL, FREET4, T3FREE, THYROIDAB in the last 72 hours. Anemia Panel: No results for input(s): VITAMINB12, FOLATE, FERRITIN, TIBC, IRON, RETICCTPCT in the last 72 hours. Urine analysis:    Component Value Date/Time   COLORURINE YELLOW 12/06/2015 2132   APPEARANCEUR CLEAR 12/06/2015 2132   LABSPEC 1.008 12/06/2015 2132   PHURINE 7.0 12/06/2015 2132   GLUCOSEU NEGATIVE 12/06/2015 2132   HGBUR NEGATIVE 12/06/2015 2132   BILIRUBINUR NEGATIVE 12/06/2015 2132   KETONESUR NEGATIVE 12/06/2015 2132   PROTEINUR NEGATIVE 12/06/2015 2132   UROBILINOGEN 1.0 12/06/2014 1152   NITRITE NEGATIVE 12/06/2015 2132   LEUKOCYTESUR NEGATIVE 12/06/2015 2132   Sepsis  Labs: @LABRCNTIP (procalcitonin:4,lacticidven:4) )No results found for this or any previous visit (from the past 240 hour(s)).    UA  no evidence of UTI     Lab Results  Component Value Date   HGBA1C 5.9 04/20/2015    Estimated Creatinine Clearance: 66.4 mL/min (by C-G formula based on SCr of 0.85 mg/dL).  BNP (last 3 results) No results for input(s): PROBNP in the last 8760 hours.   ECG REPORT  Independently reviewed Rate: 69  Rhythm: paced ST&T Change: NA QTC  567   Filed Weights   12/06/15 1633  Weight: 81.6 kg (180 lb)     Cultures: No results found for: SDES, SPECREQUEST, CULT, REPTSTATUS   Radiological Exams on Admission: Ct Head Wo Contrast  Result Date: 12/06/2015 CLINICAL DATA:  Onset of right leg weakness, unable to walk, dizziness EXAM: CT HEAD WITHOUT CONTRAST TECHNIQUE: Contiguous axial images were obtained from the base of the skull through the vertex without intravenous contrast. COMPARISON:  12/30/2014 FINDINGS: Brain: No acute territorial infarction, intracranial hemorrhage or extra-axial fluid collection. Mild to moderate global atrophy. Minimal periventricular white matter hypodensities consistent with small vessel disease. Mild bilateral basal ganglial calcification. No mass or midline shift. Ventricles similar in size and morphology. Vascular: No hyperdense vessels. Calcifications within the carotid arteries at the skullbase. Skull: Normal. Negative for fracture or focal  lesion. Sinuses/Orbits: Mucosal thickening in the ethmoids and left maxillary sinus. No acute orbital abnormality. Other: None IMPRESSION: No CT evidence for acute intracranial abnormality. Electronically Signed   By: Donavan Foil M.D.   On: 12/06/2015 18:04   Ct Lumbar Spine Wo Contrast  Result Date: 12/06/2015 CLINICAL DATA:  Right leg weakness pain in the low back EXAM: CT LUMBAR SPINE WITHOUT CONTRAST TECHNIQUE: Multidetector CT imaging of the lumbar spine was performed without  intravenous contrast administration. Multiplanar CT image reconstructions were also generated. COMPARISON:  12/29/2014 FINDINGS: Segmentation: Transitional anatomy is present. The first non rib-bearing vertebra will be designated L1. Transitional vertebra will be labeled L6/transitional vertebra. Alignment: Mild retrolisthesis of L2 on L3 and L3 on L4. Vertebrae: Minimal chronic appearing wedge deformities of L1 and L2. No acute fracture. Bilateral hyper trophic facet disease from L4 through the upper sacrum. Suspected disc bulges at L3-L4, L4-L5, L5-L6. No fatty effacement of the fat planes around the exiting nerve roots. Paraspinal and other soft tissues: Fatty atrophy of the paraspinal muscles. Atherosclerotic vascular disease of the aorta. Partially visualized bilateral renal cyst, on the right, the largest measures 2.9 cm, on the left this measures up to 5.1 cm. Disc levels: Moderate degenerative changes at L1-L2, L2-L3, L3-L4 with endplate changes and vacuum disc. IMPRESSION: 1. Suspected transitional anatomy of the lumbosacral spine. No acute fracture. Mild retrolisthesis of L2 on L3 and L3 on L4. 2. Moderate multilevel degenerative disc disease. 3. Bilateral renal cysts. Electronically Signed   By: Donavan Foil M.D.   On: 12/06/2015 20:32    Chart has been reviewed    Assessment/Plan   82 y.o. male with medical history significant of congestive cardiomyopathy, combined systolic and diastolic heart failure, CAD, HTN, postural hypotension, intermittent weakness for which she's been followed by neurology with cerebral atrophy and cerebellar atrophy on CT scan, GERD and constipation, RBBB and admitted for right leg weakness  Present on Admission: . Right leg weakness will need repeat CT in 24 hours, admit for further workup, PT /OT evaluation continue Plavix and aspirin . Cardiomyopathy, ischemic repeat echo to reevaluate given persistent low extremity edema . Combined congestive systolic and  diastolic heart failure (HCC) noted to have bilateral extremity edema which is chronic continue home medications. Repeat echocardiogram to evaluate her function . Hypertension   currently appears to be stable and see home medications . Pacemaker not a candidate for MRI    Other plan as per orders.  DVT prophylaxis:  SCD   Code Status:  FULL CODE   as per patient    Family Communication:   Family not at  Bedside    Disposition Plan:    Back to current facility when stable                                                  Would benefit from PT/OT eval prior to DC  ordered                       Social Work   consulted                          Consults called: Neurology   Admission status:   obs   Level of care   tele  I have spent a total of 56 min on this admission   Stanford Strauch 12/07/2015, 2:15 AM    [facility]  Pager [phone]   after 2 AM please page floor coverage PA If 7AM-7PM, please contact the day team taking care of the patient  [email or web address]  Password [email or web address]

## 2015-12-06 NOTE — ED Notes (Signed)
Family at bedside. 

## 2015-12-06 NOTE — ED Provider Notes (Signed)
[facility] DEPT Provider Note   CSN: [id number] Arrival date & time: 12/06/15  1554     History   Chief Complaint Chief Complaint  Patient presents with  . Weakness    HPI Adam [name] is a 82 y.o. male.  The patient states that around 12:30 he noticed his right leg feeling numb and weak. He sat down for about 20-30 minutes and the numbness went away but the weakness never did. The patient states he was able to ambulate with his walker back to his room   The history is provided by the patient. No language interpreter was used.  Weakness  Primary symptoms include focal weakness. This is a new problem. The current episode started 6 to 12 hours ago. The problem has been gradually improving. Affected Side: Right lower extremity. There has been no fever. Pertinent negatives include no shortness of breath, no chest pain and no headaches. Associated medical issues do not include seizures.    Past Medical History:  Diagnosis Date  . Arthritis    "minor, back and sometimes knees" (12/15/2012)  . Bradycardia    AFib/SSS s/p St Jude PPM 04/12/2008  . CAD (coronary artery disease)    CABG (LIMA-LAD, SVG-RCA, SVG-OM in 1996).  07/2009 BMS to SVG-RCA. Cath in 04/2010 with patent stents   . CHF (congestive heart failure) ([place])   . Exertional shortness of breath    "sometimes walking" (12/15/2012)  . GERD (gastroesophageal reflux disease)   . Heart murmur    "just told I had one today" (12/15/2012)  . Hiatal hernia   . Hyperlipidemia   . Hypertension   . Hypothyroid   . Insomnia   . Melanoma of back ([place]) 1976  . Myocardial infarction 1996; 2011   "both silent" (12/15/2012)  . Osteoporosis, senile   . Pacemaker   . RBBB   . [facility] spotted fever   . S/P CABG x 4   . Sustained ventricular tachycardia ([place]) 07/27/2014    Patient Active Problem List   Diagnosis Date Noted  . Urinary tract infection 04/06/2015  . Insomnia 03/23/2015  . Gout 02/09/2015  . Depression  with anxiety 02/02/2015  . Restless leg 02/02/2015  . Combined congestive systolic and diastolic heart failure ([place]) 02/02/2015  . Urinary frequency 02/02/2015  . Pressure ulcer 01/01/2015  . Urinary retention 12/30/2014  . Coronary artery disease with hx of myocardial infarct w/o hx of CABG 12/30/2014  . Expressive aphasia 12/30/2014  . Constipation   . Orthostatic hypotension   . Chronic knee pain 12/03/2014  . GERD (gastroesophageal reflux disease)   . Hypertension   . Dyspnea 08/11/2014  . Hypothyroidism 08/11/2014  . Sick sinus syndrome ([place]) 01/31/2014  . Hyperlipidemia 01/30/2014  . Cardiomyopathy, ischemic 08/25/2012  . Pacemaker 08/25/2012  . Dizziness, after diuretic asscoiated with hypotension and responded to fluid bolus 06/05/2011  . Bradycardia     Past Surgical History:  Procedure Laterality Date  . CARDIAC CATHETERIZATION  04/2010   LIMA to LAD patent,SVG to OM patent,no in-stnet restenosis RCA  . CATARACT EXTRACTION W/ INTRAOCULAR LENS  IMPLANT, BILATERAL Bilateral 2012  . CORONARY ANGIOPLASTY WITH STENT PLACEMENT  07/2009   bare metal stent to SVG to the RCA  . CORONARY ARTERY BYPASS GRAFT  1996   LIMA to LAD,SVG to RCA & SVG to OM  . INSERT / REPLACE / REMOVE PACEMAKER  2010  . MELANOMA EXCISION  05/1974 X2   "taken off my back" (12/15/2012)  . NM  MYOVIEW LTD  06/2011   low risk  . TONSILLECTOMY  1938  . TRANSURETHRAL RESECTION OF PROSTATE  1986  . US ECHOCARDIOGRAPHY  07/11/2009   EF 45-50%       Home Medications    Prior to Admission medications   Medication Sig Start Date End Date Taking? Authorizing Provider  amiodarone (PACERONE) 200 MG tablet Take two tablets daily for heart rhythm 08/07/15  Yes Tiffany L Reed, DO  aspirin EC 81 MG EC tablet Take 1 tablet (81 mg total) by mouth daily. 12/16/12  Yes Isaiah Serge, NP  bisacodyl (DULCOLAX) 5 MG EC tablet Take 5 mg by mouth daily as needed for moderate constipation.   Yes Historical Provider, MD    Cholecalciferol (VITAMIN D3) 5000 UNITS CAPS Take 1 capsule by mouth every Monday. Reported on 02/22/2015   Yes Historical Provider, MD  clobetasol (TEMOVATE) 0.05 % external solution Apply 1 application topically. Apply once or twice daily on scaple   Yes Historical Provider, MD  clonazePAM (KLONOPIN) 0.5 MG tablet Take 1 tablet (0.5 mg total) by mouth at bedtime. Take 1 tablet by mouth every 4 hours as needed for anxiety/restless legs 07/27/15  Yes Tiffany L Reed, DO  clopidogrel (PLAVIX) 75 MG tablet Take 75 mg by mouth daily.   Yes Historical Provider, MD  levothyroxine (SYNTHROID, LEVOTHROID) 137 MCG tablet Take 137 mcg by mouth daily before breakfast.   Yes Historical Provider, MD  lubiprostone (AMITIZA) 8 MCG capsule Take 8 mcg by mouth 2 (two) times daily with a meal.   Yes Historical Provider, MD  midodrine (PROAMATINE) 5 MG tablet Take 1 tablet (5 mg total) by mouth 3 (three) times daily with meals. Take third dose at least 6 hours before bedtime 03/16/15  Yes Mihai Croitoru, MD  pantoprazole (PROTONIX) 40 MG tablet Take 1 at bedtime to reduce stomach acid 09/05/15  Yes Estill Dooms, MD  pravastatin (PRAVACHOL) 40 MG tablet Take 1 tablet (40 mg total) by mouth daily. To lower cholesterol 10/23/15  Yes Estill Dooms, MD  rOPINIRole (REQUIP) 0.5 MG tablet Take 0.5 mg by mouth every evening. To control restless legs 12/25/14  Yes Historical Provider, MD  tamsulosin (FLOMAX) 0.4 MG CAPS capsule TAKE ONE CAPSULE DAILY TO HELP IMPROVE URINARY FREQUENCY. 11/27/15  Yes Estill Dooms, MD  traZODone (DESYREL) 50 MG tablet TAKE 1 TABLET AT BEDTIME AS NEEDED. 11/13/15  Yes Estill Dooms, MD  colchicine 0.6 MG tablet Take one tablet daily for gout Patient not taking: Reported on 12/06/2015 08/07/15   Tiffany L Reed, DO  torsemide (DEMADEX) 20 MG tablet Take 1 tablets daily if you gain 3 or more pounds weight. Stop when weight returns to normal.. Patient not taking: Reported on 12/06/2015 10/03/15   Estill Dooms, MD    Family History Family History  Problem Relation Age of Onset  . Coronary artery disease Mother   . Diabetes Mother   . Heart disease Mother   . Coronary artery disease Father   . Diabetes Father   . Lung cancer Father   . Heart disease Father   . Anuerysm Son   . Heart disease Brother     Social History Social History  Substance Use Topics  . Smoking status: Never Smoker  . Smokeless tobacco: Never Used  . Alcohol use No     Allergies   Crestor [rosuvastatin calcium]; Altace [ramipril]; Bactrim [sulfamethoxazole-trimethoprim]; Penicillins; and Sulfa antibiotics   Review of Systems Review of  Systems  Constitutional: Negative for appetite change and fatigue.  HENT: Negative for congestion, ear discharge and sinus pressure.   Eyes: Negative for discharge.  Respiratory: Negative for cough and shortness of breath.   Cardiovascular: Negative for chest pain.  Gastrointestinal: Negative for abdominal pain and diarrhea.  Genitourinary: Negative for frequency and hematuria.  Musculoskeletal: Negative for back pain.  Skin: Negative for rash.  Neurological: Positive for focal weakness and weakness. Negative for seizures and headaches.       Numbness and weakness to right lower extremity  Psychiatric/Behavioral: Negative for hallucinations.     Physical Exam Updated Vital Signs BP 132/74 (BP Location: Left Arm)   Pulse 69   Temp 97.9 F (36.6 C)   Resp 20   Ht 5\' 11"  (1.803 m)   Wt 180 lb (81.6 kg)   SpO2 98%   BMI 25.10 kg/m   Physical Exam  Constitutional: He is oriented to person, place, and time. He appears well-developed.  HENT:  Head: Normocephalic.  Eyes: Conjunctivae and EOM are normal. No scleral icterus.  Neck: Neck supple. No thyromegaly present.  Cardiovascular: Normal rate and regular rhythm.  Exam reveals no gallop and no friction rub.   No murmur heard. Pulmonary/Chest: No stridor. He has no wheezes. He has no rales. He exhibits no  tenderness.  Abdominal: He exhibits no distension. There is no tenderness. There is no rebound.  Musculoskeletal: Normal range of motion. He exhibits no edema.  Patient a longer had any numbness on exam but didn't elicit mild to moderate weakness in the right lower extremity compared to his left  Lymphadenopathy:    He has no cervical adenopathy.  Neurological: He is oriented to person, place, and time. He exhibits normal muscle tone. Coordination normal.  Skin: No rash noted. No erythema.  Psychiatric: He has a normal mood and affect. His behavior is normal.     ED Treatments / Results  Labs (all labs ordered are listed, but only abnormal results are displayed) Labs Reviewed  CBC - Abnormal; Notable for the following:       Result Value   RBC 4.20 (*)    All other components within normal limits  COMPREHENSIVE METABOLIC PANEL - Abnormal; Notable for the following:    Glucose, Bld 114 (*)    Total Protein 6.2 (*)    Albumin 3.4 (*)    All other components within normal limits  PROTIME-INR  APTT  DIFFERENTIAL  ETHANOL  PROTIME-INR  APTT  CBC  DIFFERENTIAL  COMPREHENSIVE METABOLIC PANEL  RAPID URINE DRUG SCREEN, HOSP PERFORMED  URINALYSIS, ROUTINE W REFLEX MICROSCOPIC (NOT AT [facility])  I-STAT TROPOININ, ED  I-STAT CHEM 8, ED  I-STAT TROPOININ, ED    EKG  EKG Interpretation  Date/Time:  Wednesday December 06 2015 18:30:14 EDT Ventricular Rate:  70 PR Interval:  230 QRS Duration: 222 QT Interval:  510 QTC Calculation: 551 R Axis:   -62 Text Interpretation:  Ventricular bigeminy LVH with IVCD, LAD and secondary repol abnrm Prolonged QT interval Confirmed by Aking Klabunde  MD, Reynoldo Mainer ([id number]) on 12/06/2015 8:57:23 PM       Radiology Ct Head Wo Contrast  Result Date: 12/06/2015 CLINICAL DATA:  Onset of right leg weakness, unable to walk, dizziness EXAM: CT HEAD WITHOUT CONTRAST TECHNIQUE: Contiguous axial images were obtained from the base of the skull through the vertex  without intravenous contrast. COMPARISON:  12/30/2014 FINDINGS: Brain: No acute territorial infarction, intracranial hemorrhage or extra-axial fluid collection. Mild to moderate global  atrophy. Minimal periventricular white matter hypodensities consistent with small vessel disease. Mild bilateral basal ganglial calcification. No mass or midline shift. Ventricles similar in size and morphology. Vascular: No hyperdense vessels. Calcifications within the carotid arteries at the skullbase. Skull: Normal. Negative for fracture or focal lesion. Sinuses/Orbits: Mucosal thickening in the ethmoids and left maxillary sinus. No acute orbital abnormality. Other: None IMPRESSION: No CT evidence for acute intracranial abnormality. Electronically Signed   By: Donavan Foil M.D.   On: 12/06/2015 18:04   Ct Lumbar Spine Wo Contrast  Result Date: 12/06/2015 CLINICAL DATA:  Right leg weakness pain in the low back EXAM: CT LUMBAR SPINE WITHOUT CONTRAST TECHNIQUE: Multidetector CT imaging of the lumbar spine was performed without intravenous contrast administration. Multiplanar CT image reconstructions were also generated. COMPARISON:  12/29/2014 FINDINGS: Segmentation: Transitional anatomy is present. The first non rib-bearing vertebra will be designated L1. Transitional vertebra will be labeled L6/transitional vertebra. Alignment: Mild retrolisthesis of L2 on L3 and L3 on L4. Vertebrae: Minimal chronic appearing wedge deformities of L1 and L2. No acute fracture. Bilateral hyper trophic facet disease from L4 through the upper sacrum. Suspected disc bulges at L3-L4, L4-L5, L5-L6. No fatty effacement of the fat planes around the exiting nerve roots. Paraspinal and other soft tissues: Fatty atrophy of the paraspinal muscles. Atherosclerotic vascular disease of the aorta. Partially visualized bilateral renal cyst, on the right, the largest measures 2.9 cm, on the left this measures up to 5.1 cm. Disc levels: Moderate degenerative  changes at L1-L2, L2-L3, L3-L4 with endplate changes and vacuum disc. IMPRESSION: 1. Suspected transitional anatomy of the lumbosacral spine. No acute fracture. Mild retrolisthesis of L2 on L3 and L3 on L4. 2. Moderate multilevel degenerative disc disease. 3. Bilateral renal cysts. Electronically Signed   By: Donavan Foil M.D.   On: 12/06/2015 20:32    Procedures Procedures (including critical care time)  Medications Ordered in ED Medications  rOPINIRole (REQUIP) tablet 0.5 mg (0.5 mg Oral Given 12/06/15 2151)     Initial Impression / Assessment and Plan / ED Course  I have reviewed the triage vital signs and the nursing notes.  Pertinent labs & imaging results that were available during my care of the patient were reviewed by me and considered in my medical decision making (see chart for details).  Clinical Course  CT scan of the head was negative. Patient was seen by neurology who felt like this was a peripheral problem. They recommended a CT scan of the lumbar spine which shows some degenerative changes.   Patient will be admitted for further workup    Final Clinical Impressions(s) / ED Diagnoses   Final diagnoses:  Weakness    New Prescriptions New Prescriptions   No medications on file     Milton Ferguson, MD 12/06/15 2158

## 2015-12-06 NOTE — ED Notes (Signed)
Neurologist at bedside. 

## 2015-12-06 NOTE — Consult Note (Signed)
NEURO HOSPITALIST CONSULT NOTE   Requestig physician: Dr. Roderic Palau   Reason for Consult:RLE weakness   History obtained from:  Patient     HPI:                                                                                                                                          Adam [name] is an 82 yo male with extensive cardiac history presents with RLE weakness that started at noon today. He states he had some severe back pain a few days ago but that resolved. He was still able to walk today using his walker.  Past Medical History:  Diagnosis Date  . Arthritis    "minor, back and sometimes knees" (12/15/2012)  . Bradycardia    AFib/SSS s/p St Jude PPM 04/12/2008  . CAD (coronary artery disease)    CABG (LIMA-LAD, SVG-RCA, SVG-OM in 1996).  07/2009 BMS to SVG-RCA. Cath in 04/2010 with patent stents   . CHF (congestive heart failure) ([place])   . Exertional shortness of breath    "sometimes walking" (12/15/2012)  . GERD (gastroesophageal reflux disease)   . Heart murmur    "just told I had one today" (12/15/2012)  . Hiatal hernia   . Hyperlipidemia   . Hypertension   . Hypothyroid   . Insomnia   . Melanoma of back ([place]) 1976  . Myocardial infarction 1996; 2011   "both silent" (12/15/2012)  . Osteoporosis, senile   . Pacemaker   . RBBB   . [facility] spotted fever   . S/P CABG x 4   . Sustained ventricular tachycardia ([place]) 07/27/2014    Past Surgical History:  Procedure Laterality Date  . CARDIAC CATHETERIZATION  04/2010   LIMA to LAD patent,SVG to OM patent,no in-stnet restenosis RCA  . CATARACT EXTRACTION W/ INTRAOCULAR LENS  IMPLANT, BILATERAL Bilateral 2012  . CORONARY ANGIOPLASTY WITH STENT PLACEMENT  07/2009   bare metal stent to SVG to the RCA  . CORONARY ARTERY BYPASS GRAFT  1996   LIMA to LAD,SVG to RCA & SVG to OM  . INSERT / REPLACE / REMOVE PACEMAKER  2010  . MELANOMA EXCISION  05/1974 X2   "taken off my back" (12/15/2012)  . NM  MYOVIEW LTD  06/2011   low risk  . TONSILLECTOMY  1938  . TRANSURETHRAL RESECTION OF PROSTATE  1986  . US ECHOCARDIOGRAPHY  07/11/2009   EF 45-50%    Family History  Problem Relation Age of Onset  . Coronary artery disease Mother   . Diabetes Mother   . Heart disease Mother   . Coronary artery disease Father   . Diabetes Father   . Lung cancer Father   . Heart disease Father   . Anuerysm Son   .  Heart disease Brother       Social History:  reports that he has never smoked. He has never used smokeless tobacco. He reports that he does not drink alcohol or use drugs.  Allergies  Allergen Reactions  . Crestor [Rosuvastatin Calcium]     Unknown reaction   . Altace [Ramipril] Other (See Comments)    Cough  . Bactrim [Sulfamethoxazole-Trimethoprim] Rash  . Penicillins Rash    Has patient had a PCN reaction causing immediate rash, facial/tongue/throat swelling, SOB or lightheadedness with hypotension: No Has patient had a PCN reaction causing severe rash involving mucus membranes or skin necrosis: Yes Has patient had a PCN reaction that required hospitalization Yes Has patient had a PCN reaction occurring within the last 10 years: No If all of the above answers are "NO", then may proceed with Cephalosporin use.   . Sulfa Antibiotics Rash    MEDICATIONS:                                                                                                                     I have reviewed the patient's current medications.   ROS:                                                                                                                                       History obtained from chart review and the patient  General ROS: negative for - chills, fatigue, fever, night sweats, weight gain or weight loss Psychological ROS: negative for - behavioral disorder, hallucinations, memory difficulties, mood swings or suicidal ideation Ophthalmic ROS: negative for - blurry vision, double  vision, eye pain or loss of vision ENT ROS: negative for - epistaxis, nasal discharge, oral lesions, sore throat, tinnitus or vertigo Allergy and Immunology ROS: negative for - hives or itchy/watery eyes Hematological and Lymphatic ROS: negative for - bleeding problems, bruising or swollen lymph nodes Endocrine ROS: negative for - galactorrhea, hair pattern changes, polydipsia/polyuria or temperature intolerance Respiratory ROS: negative for - cough, hemoptysis, shortness of breath or wheezing Cardiovascular ROS: negative for - chest pain, dyspnea on exertion, edema or irregular heartbeat Gastrointestinal ROS: negative for - abdominal pain, diarrhea, hematemesis, nausea/vomiting or stool incontinence Genito-Urinary ROS: negative for - dysuria, hematuria, incontinence or urinary frequency/urgency Musculoskeletal ROS: negative for - joint swelling or muscular weakness Neurological ROS: as noted in HPI Dermatological ROS: negative for rash and skin lesion changes  Blood pressure 137/80, pulse 70, temperature 97.6 F (36.4 C), temperature source Oral, resp. rate 22, height 5\' 11"  (1.803 m), weight 81.6 kg (180 lb), SpO2 98 %.   Neurologic Examination:                                                                                                      HEENT-  Normocephalic, no lesions, without obvious abnormality.  Normal external eye and conjunctiva.  Normal TM's bilaterally.  Normal auditory canals and external ears. Normal external nose, mucus membranes and septum.  Normal pharynx. Cardiovascular- regular rate and rhythm, S1, S2 normal, no murmur, click, rub or gallop, pulses palpable throughout   Lungs- chest clear, no wheezing, rales, normal symmetric air entry, Heart exam - S1, S2 normal, no murmur, no gallop, rate regular Abdomen- soft, non-tender; bowel sounds normal; no masses,  no organomegaly   Neurological Examination Mental Status: Alert, oriented, thought content appropriate.   Speech fluent without evidence of aphasia.  Able to follow 3 step commands without difficulty. Cranial Nerves: II: Visual fields grossly normal, pupils equal, round, reactive to light and accommodation III,IV, VI: ptosis not present, extra-ocular motions intact bilaterally V,VII: smile symmetric, facial light touch sensation normal bilaterally VIII: hearing normal bilaterally IX,X: uvula rises symmetrically XI: bilateral shoulder shrug XII: midline tongue extension Motor: Right : Upper extremity   5/5    Left:     Upper extremity   5/5 Lower extremity   5/5 distal, 4/5 hip flexor/extensor         Lower extremity   5/5 Tone and bulk:normal tone throughout; no atrophy noted Sensory: Pinprick and light touch intact throughout, bilaterally  Cerebellar: normal finger-to-nose       Lab Results: Basic Metabolic Panel:  Recent Labs Lab 12/06/15 1617  NA 139  K 4.0  CL 105  CO2 28  GLUCOSE 114*  BUN 12  CREATININE 0.85  CALCIUM 9.0    Liver Function Tests:  Recent Labs Lab 12/06/15 1617  AST 27  ALT 24  ALKPHOS 60  BILITOT 0.5  PROT 6.2*  ALBUMIN 3.4*   No results for input(s): LIPASE, AMYLASE in the last 168 hours. No results for input(s): AMMONIA in the last 168 hours.  CBC:  Recent Labs Lab 12/06/15 1617  WBC 5.4  NEUTROABS 3.3  HGB 13.5  HCT 41.4  MCV 98.6  PLT 183    Cardiac Enzymes: No results for input(s): CKTOTAL, CKMB, CKMBINDEX, TROPONINI in the last 168 hours.  Lipid Panel: No results for input(s): CHOL, TRIG, HDL, CHOLHDL, VLDL, LDLCALC in the last 168 hours.  CBG: No results for input(s): GLUCAP in the last 168 hours.  Microbiology: Results for orders placed or performed during the hospital encounter of 07/10/09  MRSA PCR Screening     Status: None   Collection Time: 07/10/09 11:36 PM  Result Value Ref Range Status   MRSA by PCR  NEGATIVE Final    NEGATIVE        The GeneXpert MRSA Assay (FDA approved for NASAL specimens only),  is one component of  a comprehensive MRSA colonization surveillance program. It is not intended to diagnose MRSA infection nor to guide or monitor treatment for MRSA infections.    Coagulation Studies:  Recent Labs  12/06/15 1617  LABPROT 13.7  INR 1.05    Imaging: Ct Head Wo Contrast  Result Date: 12/06/2015 CLINICAL DATA:  Onset of right leg weakness, unable to walk, dizziness EXAM: CT HEAD WITHOUT CONTRAST TECHNIQUE: Contiguous axial images were obtained from the base of the skull through the vertex without intravenous contrast. COMPARISON:  12/30/2014 FINDINGS: Brain: No acute territorial infarction, intracranial hemorrhage or extra-axial fluid collection. Mild to moderate global atrophy. Minimal periventricular white matter hypodensities consistent with small vessel disease. Mild bilateral basal ganglial calcification. No mass or midline shift. Ventricles similar in size and morphology. Vascular: No hyperdense vessels. Calcifications within the carotid arteries at the skullbase. Skull: Normal. Negative for fracture or focal lesion. Sinuses/Orbits: Mucosal thickening in the ethmoids and left maxillary sinus. No acute orbital abnormality. Other: None IMPRESSION: No CT evidence for acute intracranial abnormality. Electronically Signed   By: Donavan Foil M.D.   On: 12/06/2015 18:04         Assessment/Plan: 82 yo male with extensive cardiac history presents with RLE weakness that started at noon today. He states he had some severe back pain a few days ago but that resolved. He was still able to walk today using his walker.  Based on exam the weakness appears peripheral in nature  Recommend CT L spine since he cannot have a MRI  PT/OT  May consider repeating [place] Walk in 24/48hrs to rule out small ACA stroke since he cannot have MRI

## 2015-12-06 NOTE — Telephone Encounter (Signed)
He was in a lunch line today and his right leg went numb and now he is having headaches with is unusual for him for about 3-4 days.Trying not to really complain but wife is unsure  And would like to talk with a nurse. Have an appointment with Rosaria Ferries tomorrow at 3 but really wanted to be seen today.

## 2015-12-06 NOTE — ED Triage Notes (Addendum)
Pt reports standing in line for lunch and had onset of right leg weakness and unable to walk at that time. Reports it lasted approx 2-3 hours and has resolved. No neuro deficits noted at this time. Pt also reports recent cough and fever/chills.

## 2015-12-06 NOTE — Telephone Encounter (Signed)
SPOKE TO WIFE .  WIFE STATES PATIENT HAD NUMBNESS TODAY AND HEADACHE 2-4 DAYS.  SHE STATES HE RARELY COMPLAINS ABOUT ANYTHING. NO CHEST PAIN , SHORTNESS OF BREATH.  RN RECOMMEND PATIENT GO TO [place] - POSSIBLE NEURO - SYMPTOMS ( I.E. TIA OR CVA) , ABOVE SYMPTOMS DID NOT NOT SOUND CARDIAC IN NATURE  WIFE VERY APPRECIATE  WITH TH INFORMATION AN FAST CALL BACK ,STATES WILL GO TO ER.

## 2015-12-07 ENCOUNTER — Ambulatory Visit: Payer: Medicare Other | Admitting: Physician Assistant

## 2015-12-07 DIAGNOSIS — R531 Weakness: Secondary | ICD-10-CM | POA: Diagnosis not present

## 2015-12-07 DIAGNOSIS — Z950 Presence of cardiac pacemaker: Secondary | ICD-10-CM

## 2015-12-07 DIAGNOSIS — I5040 Unspecified combined systolic (congestive) and diastolic (congestive) heart failure: Secondary | ICD-10-CM | POA: Diagnosis not present

## 2015-12-07 DIAGNOSIS — R29898 Other symptoms and signs involving the musculoskeletal system: Secondary | ICD-10-CM

## 2015-12-07 DIAGNOSIS — I255 Ischemic cardiomyopathy: Secondary | ICD-10-CM | POA: Diagnosis not present

## 2015-12-07 DIAGNOSIS — I10 Essential (primary) hypertension: Secondary | ICD-10-CM | POA: Diagnosis not present

## 2015-12-07 DIAGNOSIS — I5042 Chronic combined systolic (congestive) and diastolic (congestive) heart failure: Secondary | ICD-10-CM | POA: Diagnosis not present

## 2015-12-07 DIAGNOSIS — I110 Hypertensive heart disease with heart failure: Secondary | ICD-10-CM | POA: Diagnosis not present

## 2015-12-07 DIAGNOSIS — I2510 Atherosclerotic heart disease of native coronary artery without angina pectoris: Secondary | ICD-10-CM | POA: Diagnosis not present

## 2015-12-07 LAB — LIPID PANEL
Cholesterol: 84 mg/dL (ref 0–200)
HDL: 41 mg/dL (ref >40)
LDL Cholesterol: 23 mg/dL (ref 0–99)
Total CHOL/HDL Ratio: 2 RATIO
Triglycerides: 102 mg/dL (ref <150)
VLDL: 20 mg/dL (ref 0–40)

## 2015-12-07 MED ORDER — SODIUM CHLORIDE 0.9 % IV SOLN: 250.0000 mL | INTRAVENOUS | Status: DC | PRN

## 2015-12-07 MED ORDER — PRAVASTATIN SODIUM 40 MG PO TABS
40.0000 mg | ORAL_TABLET | Freq: Every day | ORAL | Status: DC
  Administered 2015-12-07 – 2015-12-08 (×2): 40 mg via ORAL
  Filled 2015-12-07 (×2): qty 1

## 2015-12-07 MED ORDER — ASPIRIN EC 81 MG PO TBEC
81.0000 mg | DELAYED_RELEASE_TABLET | Freq: Every day | ORAL | Status: DC
  Administered 2015-12-07: 81 mg via ORAL
  Filled 2015-12-07 (×2): qty 1

## 2015-12-07 MED ORDER — ROPINIROLE HCL 1 MG PO TABS
0.5000 mg | ORAL_TABLET | Freq: Every evening | ORAL | Status: DC
  Administered 2015-12-07: 0.5 mg via ORAL
  Filled 2015-12-07: qty 1

## 2015-12-07 MED ORDER — STROKE: EARLY STAGES OF RECOVERY BOOK
Freq: Once | Status: AC
  Administered 2015-12-07: 01:00:00
  Filled 2015-12-07: qty 1

## 2015-12-07 MED ORDER — SODIUM CHLORIDE 0.9% FLUSH
3.0000 mL | Freq: Two times a day (BID) | INTRAVENOUS | Status: DC
  Administered 2015-12-07 – 2015-12-08 (×4): 3 mL via INTRAVENOUS

## 2015-12-07 MED ORDER — AMIODARONE HCL 200 MG PO TABS
400.0000 mg | ORAL_TABLET | Freq: Every day | ORAL | Status: DC
  Administered 2015-12-07 – 2015-12-08 (×2): 400 mg via ORAL
  Filled 2015-12-07 (×2): qty 2

## 2015-12-07 MED ORDER — LEVOTHYROXINE SODIUM 112 MCG PO TABS
137.0000 ug | ORAL_TABLET | Freq: Every day | ORAL | Status: DC
  Administered 2015-12-07 – 2015-12-08 (×2): 137 ug via ORAL
  Filled 2015-12-07 (×2): qty 1

## 2015-12-07 MED ORDER — TAMSULOSIN HCL 0.4 MG PO CAPS
0.4000 mg | ORAL_CAPSULE | Freq: Every day | ORAL | Status: DC
Start: 2015-12-07 — End: 2015-12-08
  Administered 2015-12-07 – 2015-12-08 (×2): 0.4 mg via ORAL
  Filled 2015-12-07 (×2): qty 1

## 2015-12-07 MED ORDER — CLOPIDOGREL BISULFATE 75 MG PO TABS
75.0000 mg | ORAL_TABLET | Freq: Every day | ORAL | Status: DC
  Administered 2015-12-07: 75 mg via ORAL
  Filled 2015-12-07: qty 1

## 2015-12-07 MED ORDER — POLYETHYLENE GLYCOL 3350 17 G PO PACK
17.0000 g | PACK | Freq: Every day | ORAL | Status: DC
  Administered 2015-12-07 – 2015-12-08 (×2): 17 g via ORAL
  Filled 2015-12-07 (×2): qty 1

## 2015-12-07 MED ORDER — MIDODRINE HCL 5 MG PO TABS
5.0000 mg | ORAL_TABLET | Freq: Three times a day (TID) | ORAL | Status: DC
  Administered 2015-12-07 – 2015-12-08 (×6): 5 mg via ORAL
  Filled 2015-12-07 (×6): qty 1

## 2015-12-07 MED ORDER — CLONAZEPAM 0.5 MG PO TABS
0.5000 mg | ORAL_TABLET | Freq: Every day | ORAL | Status: DC
  Administered 2015-12-07 (×2): 0.5 mg via ORAL
  Filled 2015-12-07 (×2): qty 1

## 2015-12-07 MED ORDER — ACETAMINOPHEN 650 MG RE SUPP: 650.0000 mg | RECTAL | Status: DC | PRN

## 2015-12-07 MED ORDER — ACETAMINOPHEN 325 MG PO TABS
650.0000 mg | ORAL_TABLET | ORAL | Status: DC | PRN
  Administered 2015-12-07 (×2): 650 mg via ORAL
  Filled 2015-12-07 (×2): qty 2

## 2015-12-07 MED ORDER — BISACODYL 5 MG PO TBEC: 5.0000 mg | DELAYED_RELEASE_TABLET | Freq: Every day | ORAL | Status: DC | PRN

## 2015-12-07 MED ORDER — SODIUM CHLORIDE 0.9% FLUSH: 3.0000 mL | INTRAVENOUS | Status: DC | PRN

## 2015-12-07 MED ORDER — APIXABAN 5 MG PO TABS
5.0000 mg | ORAL_TABLET | Freq: Two times a day (BID) | ORAL | Status: DC
  Administered 2015-12-07 – 2015-12-08 (×2): 5 mg via ORAL
  Filled 2015-12-07 (×3): qty 1

## 2015-12-07 MED ORDER — BISACODYL 10 MG RE SUPP: 10.0000 mg | Freq: Every day | RECTAL | Status: DC | PRN

## 2015-12-07 MED ORDER — PANTOPRAZOLE SODIUM 40 MG PO TBEC
40.0000 mg | DELAYED_RELEASE_TABLET | Freq: Every day | ORAL | Status: DC
  Administered 2015-12-07 (×2): 40 mg via ORAL
  Filled 2015-12-07 (×2): qty 1

## 2015-12-07 MED ORDER — LUBIPROSTONE 8 MCG PO CAPS
8.0000 ug | ORAL_CAPSULE | Freq: Two times a day (BID) | ORAL | Status: DC
  Administered 2015-12-07 – 2015-12-08 (×4): 8 ug via ORAL
  Filled 2015-12-07 (×4): qty 1

## 2015-12-07 NOTE — Progress Notes (Addendum)
PROGRESS NOTE    Adam [name]  [id number] DOB: [birth year] DOA: 12/06/2015 PCP: Jeanmarie Hubert, MD   Brief Narrative: Adam [name] is a 82 y.o. male with medical history significant of congestive cardiomyopathy, combined systolic and diastolic heart failure, CAD, HTN, postural hypotension, intermittent weakness for which she's been followed by neurology with cerebral atrophy and cerebellar atrophy on CT scan, GERD and constipation, RBBB   Assessment & Plan:   Active Problems:   Cardiomyopathy, ischemic   Pacemaker   Hypertension   Coronary artery disease with hx of myocardial infarct w/o hx of CABG   Combined congestive systolic and diastolic heart failure (HCC)   Right leg weakness   Right leg weakness Initial concern for CVA. No evidence on CT scan. MRI not obtainable secondary to pacemaker. Lumbar CT concerning for lumbar disc bulging in L3-4, L4-5, L5-6. Also with with mild retrolithesis and degenerative changes. Suspect patient has radicular symptoms from back pathology. -physical therapy -not considering surgery -can consider steroids if patient develops flares. Will not start at this time.  Ischemic cardiomyopathy Combined congestive systolic and diastolic heart failure -echo pending -elevate legs  Postural hypotension -continue midodrine  Pacemaker Not a candidate for MRI   DVT prophylaxis: SCD Code Status: Full code Family Communication: Daughter and wife Disposition Plan: Anticipate discharge home today or tomorrow.   Consultants:   Neurology  Procedures:  None  Antimicrobials:  None    Subjective: Patient reports full use of his leg. Has a slight headache.   Objective: Vitals:   12/07/15 0600 12/07/15 0800 12/07/15 1100 12/07/15 1300  BP: 121/62 117/87 103/61 (!) 98/57  Pulse: 70 70 69 69  Resp: 20 18 20 18   Temp:   97.5 F (36.4 C) 97.4 F (36.3 C)  TempSrc:   Oral Oral  SpO2: 96% 97% 97% 97%  Weight:      Height:       No  intake or output data in the 24 hours ending 12/07/15 1500 Filed Weights   12/06/15 1633 12/06/15 2358  Weight: 81.6 kg (180 lb) 85.8 kg (189 lb 1.6 oz)    Examination:  General exam: Appears calm and comfortable  Respiratory system: Clear to auscultation. Respiratory effort normal. Cardiovascular system: S1 & S2 heard, RRR. 2/6 systolic murmur. Gastrointestinal system: Abdomen is nondistended, soft and nontender. Normal bowel sounds heard. Central nervous system: Alert and oriented. No focal neurological deficits. Extremities: No edema. No calf tenderness Skin: No cyanosis. No rashes Psychiatry: Judgement and insight appear normal. Mood & affect appropriate.     Data Reviewed: I have personally reviewed following labs and imaging studies  CBC:  Recent Labs Lab 12/06/15 1617  WBC 5.4  NEUTROABS 3.3  HGB 13.5  HCT 41.4  MCV 98.6  PLT [id number]   Basic Metabolic Panel:  Recent Labs Lab 12/06/15 1617  NA 139  K 4.0  CL 105  CO2 28  GLUCOSE 114*  BUN 12  CREATININE 0.85  CALCIUM 9.0   GFR: Estimated Creatinine Clearance: 66.4 mL/min (by C-G formula based on SCr of 0.85 mg/dL). Liver Function Tests:  Recent Labs Lab 12/06/15 1617  AST 27  ALT 24  ALKPHOS 60  BILITOT 0.5  PROT 6.2*  ALBUMIN 3.4*   No results for input(s): LIPASE, AMYLASE in the last 168 hours. No results for input(s): AMMONIA in the last 168 hours. Coagulation Profile:  Recent Labs Lab 12/06/15 1617  INR 1.05   Cardiac Enzymes: No results for input(s): CKTOTAL, CKMB,  CKMBINDEX, TROPONINI in the last 168 hours. BNP (last 3 results) No results for input(s): PROBNP in the last 8760 hours. HbA1C: No results for input(s): HGBA1C in the last 72 hours. CBG: No results for input(s): GLUCAP in the last 168 hours. Lipid Profile:  Recent Labs  12/07/15 0449  CHOL 84  HDL 41  LDLCALC 23  TRIG 102  CHOLHDL 2.0   Thyroid Function Tests: No results for input(s): TSH, T4TOTAL, FREET4,  T3FREE, THYROIDAB in the last 72 hours. Anemia Panel: No results for input(s): VITAMINB12, FOLATE, FERRITIN, TIBC, IRON, RETICCTPCT in the last 72 hours. Sepsis Labs: No results for input(s): PROCALCITON, LATICACIDVEN in the last 168 hours.  No results found for this or any previous visit (from the past 240 hour(s)).     Radiology Studies: Ct Head Wo Contrast  Result Date: 12/06/2015 CLINICAL DATA:  Onset of right leg weakness, unable to walk, dizziness EXAM: CT HEAD WITHOUT CONTRAST TECHNIQUE: Contiguous axial images were obtained from the base of the skull through the vertex without intravenous contrast. COMPARISON:  12/30/2014 FINDINGS: Brain: No acute territorial infarction, intracranial hemorrhage or extra-axial fluid collection. Mild to moderate global atrophy. Minimal periventricular white matter hypodensities consistent with small vessel disease. Mild bilateral basal ganglial calcification. No mass or midline shift. Ventricles similar in size and morphology. Vascular: No hyperdense vessels. Calcifications within the carotid arteries at the skullbase. Skull: Normal. Negative for fracture or focal lesion. Sinuses/Orbits: Mucosal thickening in the ethmoids and left maxillary sinus. No acute orbital abnormality. Other: None IMPRESSION: No CT evidence for acute intracranial abnormality. Electronically Signed   By: Donavan Foil M.D.   On: 12/06/2015 18:04   Ct Lumbar Spine Wo Contrast  Result Date: 12/06/2015 CLINICAL DATA:  Right leg weakness pain in the low back EXAM: CT LUMBAR SPINE WITHOUT CONTRAST TECHNIQUE: Multidetector CT imaging of the lumbar spine was performed without intravenous contrast administration. Multiplanar CT image reconstructions were also generated. COMPARISON:  12/29/2014 FINDINGS: Segmentation: Transitional anatomy is present. The first non rib-bearing vertebra will be designated L1. Transitional vertebra will be labeled L6/transitional vertebra. Alignment: Mild  retrolisthesis of L2 on L3 and L3 on L4. Vertebrae: Minimal chronic appearing wedge deformities of L1 and L2. No acute fracture. Bilateral hyper trophic facet disease from L4 through the upper sacrum. Suspected disc bulges at L3-L4, L4-L5, L5-L6. No fatty effacement of the fat planes around the exiting nerve roots. Paraspinal and other soft tissues: Fatty atrophy of the paraspinal muscles. Atherosclerotic vascular disease of the aorta. Partially visualized bilateral renal cyst, on the right, the largest measures 2.9 cm, on the left this measures up to 5.1 cm. Disc levels: Moderate degenerative changes at L1-L2, L2-L3, L3-L4 with endplate changes and vacuum disc. IMPRESSION: 1. Suspected transitional anatomy of the lumbosacral spine. No acute fracture. Mild retrolisthesis of L2 on L3 and L3 on L4. 2. Moderate multilevel degenerative disc disease. 3. Bilateral renal cysts. Electronically Signed   By: Donavan Foil M.D.   On: 12/06/2015 20:32        Scheduled Meds: . amiodarone  400 mg Oral Daily  . aspirin EC  81 mg Oral Daily  . clonazePAM  0.5 mg Oral QHS  . clopidogrel  75 mg Oral Daily  . levothyroxine  137 mcg Oral QAC breakfast  . lubiprostone  8 mcg Oral BID WC  . midodrine  5 mg Oral TID WC  . pantoprazole  40 mg Oral QHS  . pravastatin  40 mg Oral Daily  . rOPINIRole  0.5 mg Oral QPM  . sodium chloride flush  3 mL Intravenous Q12H  . tamsulosin  0.4 mg Oral QPC supper   Continuous Infusions:    LOS: 0 days     Cordelia Poche [facility] 12/07/2015, 3:00 PM Pager: [phone]  If 7PM-7AM, please contact night-coverage [email or web address] Password [email or web address] 12/07/2015, 3:00 PM

## 2015-12-07 NOTE — Evaluation (Signed)
Physical Therapy Evaluation Patient Details Name: Adam [name] MRN: [id number] DOB: 26-Apr-1926 Today's Date: 12/07/2015   History of Present Illness  82 y.o.malewith medical history significant of congestive cardiomyopathy, combined systolic and diastolic heart failure, CAD, HTN, postural hypotension,intermittent weakness for which he's been followed by neurology with cerebral atrophy and cerebellar atrophy on CT scan, GERD and constipation, RBBB. Pt presenting with R LE weakness. CT on 11/1 negative for acute intracranial abnormality. CT lumbar spine with Moderate multilevel degenerative disc disease (L1-L5); MRA pending    Clinical Impression  Pt admitted with above diagnosis. Pt demnostrates RLE weakness (although improving per pt) with improving gait with rollator. Pt currently with functional limitations due to the deficits listed below (see PT Problem List).  Pt will benefit from skilled PT to increase their independence and safety with mobility to allow discharge to the venue listed below.       Follow Up Recommendations Home health PT;Supervision for mobility/OOB    Equipment Recommendations  None recommended by PT    Recommendations for Other Services       Precautions / Restrictions Precautions Precautions: Fall Restrictions Weight Bearing Restrictions: No      Mobility  Bed Mobility Overal bed mobility: Needs Assistance Bed Mobility: Supine to Sit;Sit to Supine     Supine to sit: Min guard Sit to supine: Modified independent (Device/Increase time)   General bed mobility comments: near need for assist to raise torso; incr time and effort  Transfers Overall transfer level: Needs assistance Equipment used: 4-wheeled walker Transfers: Sit to/from Stand Sit to Stand: Min guard         General transfer comment: x3 (including on/off seat of rollator); slow and effortful, with proper use of rollator  Ambulation/Gait Ambulation/Gait assistance: Min guard;Min  assist Ambulation Distance (Feet): 90 Feet Assistive device: 4-wheeled walker Gait Pattern/deviations: Step-through pattern;Decreased stride length;Trunk flexed   Gait velocity interpretation: Below normal speed for age/gender General Gait Details: tends to push RW too far ahead; steady with RW; attempted pivot to bed without walker and pt unsteady with min assuist  Stairs            Wheelchair Mobility    Modified Rankin (Stroke Patients Only) Modified Rankin (Stroke Patients Only) Pre-Morbid Rankin Score: Moderate disability Modified Rankin: Moderately severe disability     Balance Overall balance assessment: Needs assistance;History of Falls Sitting-balance support: No upper extremity supported;Feet supported Sitting balance-Leahy Scale: Fair     Standing balance support: Single extremity supported Standing balance-Leahy Scale: Poor                               Pertinent Vitals/Pain Pain Assessment: Faces Faces Pain Scale: Hurts little more Pain Location: dorsum Rt ankle Pain Descriptors / Indicators: Squeezing Pain Intervention(s): Limited activity within patient's tolerance;Monitored during session;Repositioned    Home Living Family/patient expects to be discharged to:: Private residence Living Arrangements: Spouse/significant other Available Help at Discharge: Family;Available 24 hours/day Type of Home: Independent living facility Home Access: Level entry     Home Layout: One level Home Equipment: Walker - 4 wheels;Grab bars - toilet;Grab bars - tub/shower Additional Comments: Lives at [facility] in [place] with his wife.    Prior Function Level of Independence: Independent with assistive device(s)         Comments: rollator for all mobility. Independent with BADL.     Hand Dominance   Dominant Hand: Right  Extremity/Trunk Assessment   Upper Extremity Assessment: Defer to OT evaluation RUE Deficits / Details:  Decreased grip strength and coordination. Did not notice he still had soap on R hand when washing hands; required verbal cues.   RUE Sensation: decreased light touch     Lower Extremity Assessment: RLE deficits/detail;LLE deficits/detail RLE Deficits / Details: ankle DF 5/5; knee extension 4/5; AAROM WFL (?extensor tone) LLE Deficits / Details: AROM WFL; 5/5  Cervical / Trunk Assessment: Kyphotic  Communication   Communication: No difficulties  Cognition Arousal/Alertness: Awake/alert Behavior During Therapy: WFL for tasks assessed/performed Overall Cognitive Status: Within Functional Limits for tasks assessed                      General Comments General comments (skin integrity, edema, etc.): wife and daughter present    Exercises General Exercises - Lower Extremity Ankle Circles/Pumps: AROM;PROM;Both;10 reps Heel Slides: AROM;AAROM;Strengthening;Both;5 reps (RLE required AAROM; resisted extension)   Assessment/Plan    PT Assessment Patient needs continued PT services  PT Problem List Decreased strength;Decreased balance;Decreased mobility;Decreased knowledge of use of DME;Decreased safety awareness;Impaired sensation;Impaired tone;Pain          PT Treatment Interventions DME instruction;Gait training;Functional mobility training;Therapeutic activities;Balance training;Neuromuscular re-education;Therapeutic exercise;Patient/family education    PT Goals (Current goals can be found in the Care Plan section)  Acute Rehab PT Goals Patient Stated Goal: return home PT Goal Formulation: With patient/family Time For Goal Achievement: 12/14/15 Potential to Achieve Goals: Good    Frequency Min 4X/week   Barriers to discharge        Co-evaluation               End of Session Equipment Utilized During Treatment: Gait belt Activity Tolerance: Patient tolerated treatment well Patient left: in bed;with call bell/phone within reach;with bed alarm set;with  family/visitor present      Functional Assessment Tool Used: clinical judgement Functional Limitation: Mobility: Walking and moving around Mobility: Walking and Moving Around Current Status [id number]): At least 1 percent but less than 20 percent impaired, limited or restricted Mobility: Walking and Moving Around Goal Status [phone]): At least 1 percent but less than 20 percent impaired, limited or restricted    Time: 1422-1500 PT Time Calculation (min) (ACUTE ONLY): 38 min   Charges:   PT Evaluation $PT Eval Moderate Complexity: 1 Procedure PT Treatments $Gait Training: 8-22 mins $Therapeutic Activity: 8-22 mins   PT G Codes:   PT G-Codes **NOT FOR INPATIENT CLASS** Functional Assessment Tool Used: clinical judgement Functional Limitation: Mobility: Walking and moving around Mobility: Walking and Moving Around Current Status [id number]): At least 1 percent but less than 20 percent impaired, limited or restricted Mobility: Walking and Moving Around Goal Status [phone]): At least 1 percent but less than 20 percent impaired, limited or restricted    Franceen Erisman 12/07/2015, 5:38 PM 12/07/2015 Pager [phone]

## 2015-12-07 NOTE — Progress Notes (Signed)
Pt came in from ED with stroke like symptoms, pt alert and oriented, c/o of slight headache, pt settled in bed with call light at bedside, tele monitor put and verified on pt, v/s stable, will continue to monitor. [name], Adam [name]

## 2015-12-07 NOTE — Progress Notes (Signed)
STROKE TEAM PROGRESS NOTE   HISTORY OF PRESENT ILLNESS (per record) Adam [name] is an 82 yo male with extensive cardiac history presents with RLE weakness that started at noon today. He states he had some severe back pain a few days ago but that resolved. He was still able to walk today using his walker. Patient was not administered IV t-PA . He was admitted for further evaluation and treatment.   SUBJECTIVE (INTERVAL HISTORY) His wife and daughter are at the bedside.  Overall he feels his condition is stable. He reports he still has right lower extremity weakness. He shared sudden onset of weakness yesterday.   OBJECTIVE Temp:  [97.6 F (36.4 C)-98.6 F (37 C)] 98.2 F (36.8 C) (11/01 2358) Pulse Rate:  [68-70] 70 (11/02 0800) Cardiac Rhythm: A-V Sequential paced (11/02 0002) Resp:  [13-22] 18 (11/02 0800) BP: (103-150)/(57-87) 117/87 (11/02 0800) SpO2:  [95 %-100 %] 97 % (11/02 0800) Weight:  [81.6 kg (180 lb)-85.8 kg (189 lb 1.6 oz)] 85.8 kg (189 lb 1.6 oz) (11/01 2358)  CBC:  Recent Labs Lab 12/06/15 1617  WBC 5.4  NEUTROABS 3.3  HGB 13.5  HCT 41.4  MCV 98.6  PLT [id number]    Basic Metabolic Panel:  Recent Labs Lab 12/06/15 1617  NA 139  K 4.0  CL 105  CO2 28  GLUCOSE 114*  BUN 12  CREATININE 0.85  CALCIUM 9.0    Lipid Panel:    Component Value Date/Time   CHOL 84 12/07/2015 0449   TRIG 102 12/07/2015 0449   HDL 41 12/07/2015 0449   CHOLHDL 2.0 12/07/2015 0449   VLDL 20 12/07/2015 0449   LDLCALC 23 12/07/2015 0449   HgbA1c:  Lab Results  Component Value Date   HGBA1C 5.9 04/20/2015   Urine Drug Screen:    Component Value Date/Time   LABOPIA NONE DETECTED 12/06/2015 2132   COCAINSCRNUR NONE DETECTED 12/06/2015 2132   LABBENZ NONE DETECTED 12/06/2015 2132   AMPHETMU NONE DETECTED 12/06/2015 2132   THCU NONE DETECTED 12/06/2015 2132   LABBARB NONE DETECTED 12/06/2015 2132      IMAGING  Ct Head Wo Contrast  Result Date: 12/06/2015 CLINICAL  DATA:  Onset of right leg weakness, unable to walk, dizziness EXAM: CT HEAD WITHOUT CONTRAST TECHNIQUE: Contiguous axial images were obtained from the base of the skull through the vertex without intravenous contrast. COMPARISON:  12/30/2014 FINDINGS: Brain: No acute territorial infarction, intracranial hemorrhage or extra-axial fluid collection. Mild to moderate global atrophy. Minimal periventricular white matter hypodensities consistent with small vessel disease. Mild bilateral basal ganglial calcification. No mass or midline shift. Ventricles similar in size and morphology. Vascular: No hyperdense vessels. Calcifications within the carotid arteries at the skullbase. Skull: Normal. Negative for fracture or focal lesion. Sinuses/Orbits: Mucosal thickening in the ethmoids and left maxillary sinus. No acute orbital abnormality. Other: None IMPRESSION: No CT evidence for acute intracranial abnormality. Electronically Signed   By: Donavan Foil M.D.   On: 12/06/2015 18:04   Ct Lumbar Spine Wo Contrast  Result Date: 12/06/2015 CLINICAL DATA:  Right leg weakness pain in the low back EXAM: CT LUMBAR SPINE WITHOUT CONTRAST TECHNIQUE: Multidetector CT imaging of the lumbar spine was performed without intravenous contrast administration. Multiplanar CT image reconstructions were also generated. COMPARISON:  12/29/2014 FINDINGS: Segmentation: Transitional anatomy is present. The first non rib-bearing vertebra will be designated L1. Transitional vertebra will be labeled L6/transitional vertebra. Alignment: Mild retrolisthesis of L2 on L3 and L3 on L4. Vertebrae: Minimal  chronic appearing wedge deformities of L1 and L2. No acute fracture. Bilateral hyper trophic facet disease from L4 through the upper sacrum. Suspected disc bulges at L3-L4, L4-L5, L5-L6. No fatty effacement of the fat planes around the exiting nerve roots. Paraspinal and other soft tissues: Fatty atrophy of the paraspinal muscles. Atherosclerotic vascular  disease of the aorta. Partially visualized bilateral renal cyst, on the right, the largest measures 2.9 cm, on the left this measures up to 5.1 cm. Disc levels: Moderate degenerative changes at L1-L2, L2-L3, L3-L4 with endplate changes and vacuum disc. IMPRESSION: 1. Suspected transitional anatomy of the lumbosacral spine. No acute fracture. Mild retrolisthesis of L2 on L3 and L3 on L4. 2. Moderate multilevel degenerative disc disease. 3. Bilateral renal cysts. Electronically Signed   By: Donavan Foil M.D.   On: 12/06/2015 20:32       PHYSICAL EXAM Pleasant elderly obese Caucasian male not in distress. . Afebrile. Head is nontraumatic. Neck is supple without bruit.    Cardiac exam no murmur or gallop. Lungs are clear to auscultation. Distal pulses are well felt. He has multiple ecchymosis and petechiae on his extremities. Neurological Exam :  He is awake alert oriented 3 with normal speech and language function. Pupils are equal reactive fundi are not visualized. Vision acuity and fields seem normal. Face is symmetric without weakness. Tongue is midline. He has slight diminished glabellar tap and decreased facial expression. Motor system exam reveals no resting or action tremor. He has mild right hand and intrinsic muscle weakness. He has grade 3/5 right lower extremity weakness and weakness proximally in the hip vigorous and distally in the ankle dorsiflexors. The subjective diminished touch pinprick sensation in the right leg from the knee down.  Reflexes are 1+ symmetric plantars are downgoing. Gait was not tested. ASSESSMENT/PLAN [name] is a 82 y.o. male with history of congestive cardiomyopathy, combined systolic and diastolic heart failure, CAD, HTN, postural hypotension, intermittent weakness presenting with RLE weakness and numbness. He did not receive IV t-PA.   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 Workup underway.    Resultant  Improvement in RLE symptos  MRI  / MRA  Pacer  Initial CT without stroke  CTA head and neck ordered  2D Echo  pending   LDL 23  HgbA1c pending  SCDs for VTE prophylaxis  Diet Heart Room service appropriate? Yes; Fluid consistency: Thin  aspirin 81 mg daily and clopidogrel 75 mg daily prior to admission, now on aspirin 81 mg daily and clopidogrel 75 mg daily  Patient counseled to be compliant with his antithrombotic medications  Ongoing aggressive stroke risk factor management  Therapy recommendations:  HH PT and OT  Disposition:  Return to [facility]: Lambert Rhonda W (lives at [facility])  He is followed by Dr. Rexene Alberts as an OP  Paroxysmal Atrial Fibrillation  On coumadin in the past Her family  Home anticoagulation:  None at this time   No recent falls in the past year, per family   Followed by Dr. Danford Bad    Hypertension  Stable  Long-term BP goal normotensive  Hyperlipidemia  Home meds:  Pravachol 40, resumed in hospital  LDL 23, goal < 70  Continue statin at discharge  Other Stroke Risk Factors  Advanced age  Coronary artery disease status post CABG, stents  Ischemic cardiomyopathy  Combined congestive systolic and diastolic heart failure  Other Active Problems  Baseline. Progressive dementia  Sustained ventricular  tachycardia, has pacer   Headache, likely secondary to sinus disease seen on CT  Right hand weakness, chronic. Family reports history of carpal tunnel. Has received outpatient OT. Followed by Dr. Rollene Rotunda day # [place] for Pager information 12/07/2015 4:03 PM  I have personally examined this patient, reviewed notes, independently viewed imaging studies, participated in medical decision making and plan of care.ROS completed by me personally and pertinent positives fully documented  I have made any additions or clarifications directly to the above note. Agree with note above. Marland Kitchen He  has presented with sudden onset of right leg weakness and numbness likely due to a small left hemispheric infarct etiology probably embolic from atrial fibrillation. Patient remains at increased risk for recurrent stroke TIA neurological worsening. He needs long-term anticoagulation. I and discuss the risk-benefit with the patient, wife and daughter as well as with his cardiologist Dr.Croituru and paln to switch him to eliquis and stop aspirin and Plavix. Everyone is in agreement. Greater than 50% of time during this 35 visit was spent on counseling and coordination of care about atrial fibrillation, stroke risk, prevention and treatment and bleeding risk with eliquis.  Antony Contras, MD Medical Director [place] Pager: [phone] 12/07/2015 4:17 PM  To contact Stroke Continuity provider, please refer to [email or web address] After hours, contact General Neurology

## 2015-12-07 NOTE — Care Management Obs Status (Signed)
[place] NOTIFICATION   Patient Details  Name: Adam [name] MRN: [id number] Date of Birth: September 16, 1926   Medicare Observation Status Notification Given:  Yes    Pollie Friar, RN 12/07/2015, 3:21 PM

## 2015-12-07 NOTE — Progress Notes (Signed)
Physical therapy brief note (full evaluation to follow)    12/07/15 1512  PT Assessment  PT Recommendation/Assessment Patient needs continued PT services  PT Recommendation  Follow Up Recommendations Home health PT;Supervision for mobility/OOB  PT equipment None recommended by PT    12/07/2015 Barry Brunner, PT Pager: [phone]

## 2015-12-07 NOTE — Progress Notes (Signed)
ANTICOAGULATION CONSULT NOTE - Initial Consult  Pharmacy Consult for Eliquis Indication: atrial fibrillation  Allergies  Allergen Reactions  . Crestor [Rosuvastatin Calcium]     Unknown reaction   . Altace [Ramipril] Other (See Comments)    Cough  . Bactrim [Sulfamethoxazole-Trimethoprim] Rash  . Penicillins Rash    Has patient had a PCN reaction causing immediate rash, facial/tongue/throat swelling, SOB or lightheadedness with hypotension: No Has patient had a PCN reaction causing severe rash involving mucus membranes or skin necrosis: Yes Has patient had a PCN reaction that required hospitalization Yes Has patient had a PCN reaction occurring within the last 10 years: No If all of the above answers are "NO", then may proceed with Cephalosporin use.   . Sulfa Antibiotics Rash    Patient Measurements: Height: 5\' 11"  (180.3 cm) Weight: 189 lb 1.6 oz (85.8 kg) IBW/kg (Calculated) : 75.3  Vital Signs: Temp: 97.4 F (36.3 C) (11/02 1300) Temp Source: Oral (11/02 1300) BP: 98/57 (11/02 1300) Pulse Rate: 69 (11/02 1300)  Labs:  Recent Labs  12/06/15 1617  HGB 13.5  HCT 41.4  PLT 183  APTT 34  LABPROT 13.7  INR 1.05  CREATININE 0.85    Estimated Creatinine Clearance: 66.4 mL/min (by C-G formula based on SCr of 0.85 mg/dL).   Medical History: Past Medical History:  Diagnosis Date  . Arthritis    "minor, back and sometimes knees" (12/15/2012)  . Bradycardia    AFib/SSS s/p St Jude PPM 04/12/2008  . CAD (coronary artery disease)    CABG (LIMA-LAD, SVG-RCA, SVG-OM in 1996).  07/2009 BMS to SVG-RCA. Cath in 04/2010 with patent stents   . CHF (congestive heart failure) ([place])   . Exertional shortness of breath    "sometimes walking" (12/15/2012)  . GERD (gastroesophageal reflux disease)   . Heart murmur    "just told I had one today" (12/15/2012)  . Hiatal hernia   . Hyperlipidemia   . Hypertension   . Hypothyroid   . Insomnia   . Melanoma of back ([place]) 1976  .  Myocardial infarction 1996; 2011   "both silent" (12/15/2012)  . Osteoporosis, senile   . Pacemaker   . RBBB   . [facility] spotted fever   . S/P CABG x 4   . Sustained ventricular tachycardia ([place]) 07/27/2014    Medications:  Prescriptions Prior to Admission  Medication Sig Dispense Refill Last Dose  . amiodarone (PACERONE) 200 MG tablet Take two tablets daily for heart rhythm 60 tablet 3 12/06/2015 at Unknown time  . aspirin EC 81 MG EC tablet Take 1 tablet (81 mg total) by mouth daily.   12/05/2015 at Unknown time  . bisacodyl (DULCOLAX) 5 MG EC tablet Take 5 mg by mouth daily as needed for moderate constipation.   Past Week at Unknown time  . Cholecalciferol (VITAMIN D3) 5000 UNITS CAPS Take 1 capsule by mouth every Monday. Reported on 02/22/2015   Past Week at Unknown time  . clobetasol (TEMOVATE) 0.05 % external solution Apply 1 application topically. Apply once or twice daily on scaple   Past Week at Unknown time  . clonazePAM (KLONOPIN) 0.5 MG tablet Take 1 tablet (0.5 mg total) by mouth at bedtime. Take 1 tablet by mouth every 4 hours as needed for anxiety/restless legs 180 tablet 0 12/05/2015 at Unknown time  . clopidogrel (PLAVIX) 75 MG tablet Take 75 mg by mouth daily.   12/05/2015 at Unknown time  . levothyroxine (SYNTHROID, LEVOTHROID) 137 MCG tablet Take 137  mcg by mouth daily before breakfast.   12/06/2015 at Unknown time  . lubiprostone (AMITIZA) 8 MCG capsule Take 8 mcg by mouth 2 (two) times daily with a meal.   12/06/2015 at Unknown time  . midodrine (PROAMATINE) 5 MG tablet Take 1 tablet (5 mg total) by mouth 3 (three) times daily with meals. Take third dose at least 6 hours before bedtime 90 tablet 5 12/06/2015 at Unknown time  . pantoprazole (PROTONIX) 40 MG tablet Take 1 at bedtime to reduce stomach acid 60 tablet 11 12/05/2015 at Unknown time  . pravastatin (PRAVACHOL) 40 MG tablet Take 1 tablet (40 mg total) by mouth daily. To lower cholesterol 90 tablet 1 12/05/2015  at Unknown time  . rOPINIRole (REQUIP) 0.5 MG tablet Take 0.5 mg by mouth every evening. To control restless legs  2 12/05/2015 at Unknown time  . tamsulosin (FLOMAX) 0.4 MG CAPS capsule TAKE ONE CAPSULE DAILY TO HELP IMPROVE URINARY FREQUENCY. 30 capsule 0 12/05/2015 at Unknown time  . traZODone (DESYREL) 50 MG tablet TAKE 1 TABLET AT BEDTIME AS NEEDED. 30 tablet 0 12/05/2015 at Unknown time  . colchicine 0.6 MG tablet Take one tablet daily for gout (Patient not taking: Reported on 12/06/2015) 30 tablet 3 Not Taking at Unknown time  . torsemide (DEMADEX) 20 MG tablet Take 1 tablets daily if you gain 3 or more pounds weight. Stop when weight returns to normal.. (Patient not taking: Reported on 12/06/2015) 30 tablet 5 Not Taking at Unknown time    Assessment: 82 y/o F presented to ED 11/2 with R leg weakness and tingling while standing in a line. Last approx 4 hrs. He has been experiencing some intermittent weakness for which he follows with outpatient neurology with cerebral atrophy and cerebellar atrophy on CT scan. Initial CT without CVA, but stroke MD notes small left hemispheric infarct etiology probably embolic from atrial fibrillation. LDL 23. Start Eliquis - 82 y/o, 85.8 kg, Scr 0.85  Goal of Therapy:  Therapeutic oral anticoagulation   Plan:  Eliquis 5mg  BID   Jenniefer Salak S. Alford Highland, PharmD, BCPS Clinical Staff Pharmacist Pager [phone]  Eilene Ghazi Stillinger 12/07/2015,4:22 PM

## 2015-12-07 NOTE — Evaluation (Signed)
Occupational Therapy Evaluation Patient Details Name: Adam [name] MRN: [id number] DOB: [birth year] Today's Date: 12/07/2015    History of Present Illness 82 y.o.malewith medical history significant of congestive cardiomyopathy, combined systolic and diastolic heart failure, CAD, HTN, postural hypotension,intermittent weakness for which she's been followed by neurology with cerebral atrophy and cerebellar atrophy on CT scan, GERD and constipation, RBBB. Pt presenting with R LE weakness. CT on 11/1 negative for acute intracranial abnormality.   Clinical Impression   Pt reports he was independent with BADL PTA. Currently pt overall min guard for ADL and functional mobility at this time. Pt presenting with impaired standing balance, RUE decreased strength and coordination impacting his independence and safety with ADL and functional mobility. Pt planning to d/c home to independent living at [facility] with 24/7 supervision from his wife. Recommending HHOT for follow up to maximize independence and safety with ADL and functional mobility upon return home. Pt would benefit from continued skilled OT to address established goals.    Follow Up Recommendations  Home health OT;Supervision/Assistance - 24 hour    Equipment Recommendations  Tub/shower seat    Recommendations for Other Services PT consult     Precautions / Restrictions Precautions Precautions: Fall Restrictions Weight Bearing Restrictions: No      Mobility Bed Mobility               General bed mobility comments: Pt OOB in chair upon arrival.  Transfers Overall transfer level: Needs assistance Equipment used: 4-wheeled walker Transfers: Sit to/from Stand Sit to Stand: Min guard         General transfer comment: Min guard for safety. Multiple attempts to perform sit to stand from chair and rollator seat.    Balance Overall balance assessment: Needs assistance Sitting-balance support: Feet  supported;No upper extremity supported Sitting balance-Leahy Scale: Fair     Standing balance support: No upper extremity supported;During functional activity Standing balance-Leahy Scale: Fair Standing balance comment: Able to stand at sink and complete grooming activities without UE support                            ADL Overall ADL's : Needs assistance/impaired Eating/Feeding: Set up;Sitting Eating/Feeding Details (indicate cue type and reason): Pt reports increased difficulty with self feeding recently. Hard to grip utensils. Grooming: Min guard;Standing;Wash/dry hands;Oral care   Upper Body Bathing: Set up;Supervision/ safety;Sitting   Lower Body Bathing: Min guard;Sit to/from stand   Upper Body Dressing : Set up;Supervision/safety;Sitting   Lower Body Dressing: Min guard;Sit to/from stand   Toilet Transfer: Min guard;Ambulation;Comfort height toilet;RW   Toileting- Water quality scientist and Hygiene: Min guard;Sit to/from stand       Functional mobility during ADLs: Min guard;Rolling walker General ADL Comments: Educated pt and family on continued functional use of RUE. Discussed follow up therapy; pt and family are agreeable. Pt noted to have increased bil LE edema; RN notified.      Vision Additional Comments: Needs further assessment. Able to complete functional tasks without difficulty.   Perception     Praxis      Pertinent Vitals/Pain Pain Assessment: No/denies pain     Hand Dominance Right   Extremity/Trunk Assessment Upper Extremity Assessment Upper Extremity Assessment: RUE deficits/detail RUE Deficits / Details: Decreased grip strength and coordination. Did not notice he still had soap on R hand when washing hands; required verbal cues. RUE Sensation: decreased light touch RUE Coordination: decreased fine motor  Lower Extremity Assessment Lower Extremity Assessment: Defer to PT evaluation   Cervical / Trunk Assessment Cervical /  Trunk Assessment: Kyphotic   Communication Communication Communication: No difficulties   Cognition Arousal/Alertness: Awake/alert Behavior During Therapy: WFL for tasks assessed/performed Overall Cognitive Status: Within Functional Limits for tasks assessed                     General Comments       Exercises       Shoulder Instructions      Home Living Family/patient expects to be discharged to:: Private residence Living Arrangements: Spouse/significant other Available Help at Discharge: Family;Available 24 hours/day Type of Home: Apartment Home Access: Level entry     Home Layout: One level     Bathroom Shower/Tub: Occupational psychologist: Handicapped height     Home Equipment: Walker - 4 wheels;Grab bars - toilet;Grab bars - tub/shower   Additional Comments: Lives at [facility] in [place] with his wife.      Prior Functioning/Environment Level of Independence: Independent with assistive device(s)        Comments: rollator for all mobility. Independent with BADL.        OT Problem List: Decreased strength;Impaired balance (sitting and/or standing);Decreased coordination;Decreased knowledge of use of DME or AE;Decreased knowledge of precautions;Impaired sensation;Impaired UE functional use;Increased edema   OT Treatment/Interventions: Self-care/ADL training;Therapeutic exercise;Energy conservation;DME and/or AE instruction;Therapeutic activities;Patient/family education;Balance training    OT Goals(Current goals can be found in the care plan section) Acute Rehab OT Goals Patient Stated Goal: return home OT Goal Formulation: With patient/family Time For Goal Achievement: 12/21/15 Potential to Achieve Goals: Good ADL Goals Pt Will Perform Grooming: with supervision;standing Pt Will Perform Upper Body Bathing: with set-up;sitting Pt Will Perform Lower Body Bathing: with supervision;sit to/from stand Pt Will Transfer to  Toilet: with supervision;ambulating;regular height toilet Pt Will Perform Tub/Shower Transfer: Shower transfer;with supervision;ambulating;shower seat;rolling walker Pt/caregiver will Perform Home Exercise Program: Right Upper extremity;With theraputty;Independently;With written HEP provided;Increased strength (increase fine motor coordination)  OT Frequency: Min 2X/week   Barriers to D/C:            Co-evaluation              End of Session Equipment Utilized During Treatment: Surveyor, mining Communication: Mobility status;Other (comment) (bil LE swelling noted)  Activity Tolerance: Patient tolerated treatment well Patient left: Other (comment) (with transport to CT)   Time: [id number] OT Time Calculation (min): 28 min Charges:  OT General Charges $OT Visit: 1 Procedure OT Evaluation $OT Eval Moderate Complexity: 1 Procedure OT Treatments $Self Care/Home Management : 8-22 mins G-Codes: OT G-codes **NOT FOR INPATIENT CLASS** Functional Assessment Tool Used: Clinical judgement Functional Limitation: Self care Self Care Current Status [id number]): At least 1 percent but less than 20 percent impaired, limited or restricted Self Care Goal Status [id number]): At least 1 percent but less than 20 percent impaired, limited or restricted    Binnie Kand M.S., OTR/L Pager: [phone]  12/07/2015, 9:41 AM

## 2015-12-08 ENCOUNTER — Observation Stay (HOSPITAL_COMMUNITY): Payer: Medicare Other

## 2015-12-08 ENCOUNTER — Other Ambulatory Visit (HOSPITAL_COMMUNITY): Payer: Medicare Other

## 2015-12-08 DIAGNOSIS — I255 Ischemic cardiomyopathy: Secondary | ICD-10-CM | POA: Diagnosis not present

## 2015-12-08 DIAGNOSIS — I110 Hypertensive heart disease with heart failure: Secondary | ICD-10-CM | POA: Diagnosis not present

## 2015-12-08 DIAGNOSIS — I5042 Chronic combined systolic (congestive) and diastolic (congestive) heart failure: Secondary | ICD-10-CM | POA: Diagnosis not present

## 2015-12-08 DIAGNOSIS — I5040 Unspecified combined systolic (congestive) and diastolic (congestive) heart failure: Secondary | ICD-10-CM | POA: Diagnosis not present

## 2015-12-08 DIAGNOSIS — I2510 Atherosclerotic heart disease of native coronary artery without angina pectoris: Secondary | ICD-10-CM | POA: Diagnosis not present

## 2015-12-08 DIAGNOSIS — I10 Essential (primary) hypertension: Secondary | ICD-10-CM | POA: Diagnosis not present

## 2015-12-08 DIAGNOSIS — R531 Weakness: Secondary | ICD-10-CM | POA: Diagnosis not present

## 2015-12-08 DIAGNOSIS — I639 Cerebral infarction, unspecified: Secondary | ICD-10-CM

## 2015-12-08 DIAGNOSIS — R29898 Other symptoms and signs involving the musculoskeletal system: Secondary | ICD-10-CM | POA: Diagnosis not present

## 2015-12-08 LAB — HEMOGLOBIN A1C
Hgb A1c MFr Bld: 5.6 % (ref 4.8–5.6)
Mean Plasma Glucose: 114 mg/dL

## 2015-12-08 LAB — TROPONIN I: Troponin I: <0.03 ng/mL (ref <0.03)

## 2015-12-08 MED ORDER — GI COCKTAIL ~~LOC~~
30.0000 mL | Freq: Once | ORAL | Status: AC
  Administered 2015-12-08: 30 mL via ORAL
  Filled 2015-12-08: qty 30

## 2015-12-08 MED ORDER — APIXABAN 5 MG PO TABS: 5.0000 mg | ORAL_TABLET | Freq: Two times a day (BID) | ORAL | 0 refills | Status: DC

## 2015-12-08 MED ORDER — IOPAMIDOL (ISOVUE-370) INJECTION 76%
INTRAVENOUS | Status: AC
  Administered 2015-12-08: 100 mL
  Filled 2015-12-08: qty 100

## 2015-12-08 MED ORDER — ROPINIROLE HCL 1 MG PO TABS
0.5000 mg | ORAL_TABLET | Freq: Two times a day (BID) | ORAL | Status: DC
  Administered 2015-12-08 (×2): 0.5 mg via ORAL
  Filled 2015-12-08 (×2): qty 1

## 2015-12-08 MED ORDER — MORPHINE SULFATE (PF) 2 MG/ML IV SOLN
1.0000 mg | Freq: Once | INTRAVENOUS | Status: AC
  Administered 2015-12-08: 1 mg via INTRAVENOUS
  Filled 2015-12-08: qty 1

## 2015-12-08 MED ORDER — ROPINIROLE HCL 0.5 MG PO TABS: 0.5000 mg | ORAL_TABLET | Freq: Two times a day (BID) | ORAL | 0 refills | Status: DC

## 2015-12-08 NOTE — Discharge Instructions (Addendum)
Adam [name], you were admitted for concern of a stroke. The neurologist evaluated you and thinks you had a small stroke that likely affected your leg. You also seem to have spine disease that likely is contributing as well. Because of your atrial fibrillation, you were started on Eliquis to reduce the risk of strokes. This is a blood thinner.  You had some neck and back pain. This appears to be related to the muscles in your back and neck. I advised you to be cognizant of your posture. You can use Tylenol to help with pain in addition to massage and exercise. You will get home physical therapy which can help with this.  It was a pleasure taking care of you. Thank you for allowing me. Thank you for coming to [facility].  Sincerely,  Cordelia Poche, MD  Information on my medicine - ELIQUIS (apixaban)  This medication education was reviewed with me or my healthcare representative as part of my discharge preparation.  The pharmacist that spoke with me during my hospital stay was:  Tad Moore, [facility]  Why was Eliquis prescribed for you? Eliquis was prescribed for you to reduce the risk of a blood clot forming that can cause a stroke if you have a medical condition called atrial fibrillation (a type of irregular heartbeat).  What do You need to know about Eliquis ? Take your Eliquis TWICE DAILY - one tablet in the morning and one tablet in the evening with or without food. If you have difficulty swallowing the tablet whole please discuss with your pharmacist how to take the medication safely.  Take Eliquis exactly as prescribed by your doctor and DO NOT stop taking Eliquis without talking to the doctor who prescribed the medication.  Stopping may increase your risk of developing a stroke.  Refill your prescription before you run out.  After discharge, you should have regular check-up appointments with your healthcare provider that is prescribing your Eliquis.  In the future your dose  may need to be changed if your kidney function or weight changes by a significant amount or as you get older.  What do you do if you miss a dose? If you miss a dose, take it as soon as you remember on the same day and resume taking twice daily.  Do not take more than one dose of ELIQUIS at the same time to make up a missed dose.  Important Safety Information A possible side effect of Eliquis is bleeding. You should call your healthcare provider right away if you experience any of the following: ? Bleeding from an injury or your nose that does not stop. ? Unusual colored urine (red or dark brown) or unusual colored stools (red or black). ? Unusual bruising for unknown reasons. ? A serious fall or if you hit your head (even if there is no bleeding).  Some medicines may interact with Eliquis and might increase your risk of bleeding or clotting while on Eliquis. To help avoid this, consult your healthcare provider or pharmacist prior to using any new prescription or non-prescription medications, including herbals, vitamins, non-steroidal anti-inflammatory drugs (NSAIDs) and supplements.  This website has more information on Eliquis (apixaban): http://[email or web address].com/eliquis/home

## 2015-12-08 NOTE — Discharge Summary (Signed)
Physician Discharge Summary  Adam [name] [id number] DOB: Nov 23, 1926 DOA: 12/06/2015  PCP: Adam Hubert, MD  Admit date: 12/06/2015 Discharge date: 12/08/2015  Admitted From: Home ([place]) Disposition:  Home ([place])  Recommendations for Outpatient Follow-up:  1. Follow up with PCP in 1 week 2. Follow-up back pain: consider steroids, physical therapy, analgesics. Can possibly follow-up with neurosurgery for epidural injections if desired 3. Follow-up cardiology 4. Outpatient echo  Home Health: Home health PT and OT  Discharge Condition: Stable CODE STATUS: Full code  Brief/Interim Summary:  HPI written by Dr. Toy [name]  HPI: Adam [name] is a 82 y.o. male with medical history significant of congestive cardiomyopathy, combined systolic and diastolic heart failure, CAD, HTN, postural hypotension, intermittent weakness for which she's been followed by neurology with cerebral atrophy and cerebellar atrophy on CT scan, GERD and constipation, RBBB  Presented with right lower extremity weakness since noon.  He was standing in line waiting for food and his right leg went numb he had to sit down. The numbness lasted for at least 4 hours. He has intermittent dizziness but this was not happening at the time. No chest pain.   He sometimes have intermittent weakness currently states the weakness has resolved. and has trouble holding fork this has been chronic and he has been seen by neurology  For this. He denies any chest pain or shortness of breath. Leg weakness was associated with tingling. He reports restless leg syndrome bothering him currently otherwise he feels well. He had some minimal back pain associated but doing better.  Regarding pertinent Chronic problems: Known history of coronary artery disease status post CABG in 1996 and cardiac cath in 2011 with bare metal stent to SVG in the RCA cardiac catheterization 2012 showing patent  stents   IN ER:  Temp (24hrs), Avg:98 F (36.7 C), Min:97.6 F (36.4 C), Max:98.6 F (37 C)  RR 20 98% BP 132/74 Trop 0.01 INR 1.05 WBC 5.4   CT lumbar spine showing suspected transitional anatomy of lumbar sacral spine degenerative changes. CT head: Negative   Hospital course:  Right leg weakness Initial concern for CVA. No evidence on CT scan. MRI not obtainable secondary to pacemaker. Lumbar CT concerning for lumbar disc bulging in L3-4, L4-5, L5-6. Also with with mild retrolithesis and degenerative changes. Suspect patient has radicular symptoms from back pathology. 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 Echo not obtained before discharge, but patient started on anticoagulation for atrial fibrillation. Discussed options for back pathology, including surgery, steroids, physical therapy and analgesics.  Ischemic cardiomyopathy Combined congestive systolic and diastolic heart failure Echo not obtained before discharge. Patient euvolemic. No heart failure exacerbation.  Atrial fibrillation Rate controlled. Started on Eliquis.  Postural hypotension -continue midodrine  Pacemaker Not a candidate for MRI  Discharge Diagnoses:  Active Problems:   Cardiomyopathy, ischemic   Pacemaker   Hypertension   Coronary artery disease with hx of myocardial infarct w/o hx of CABG   Combined congestive systolic and diastolic heart failure (HCC)   Right leg weakness    Discharge Instructions     Medication List    STOP taking these medications   aspirin 81 MG EC tablet   clopidogrel 75 MG tablet Commonly known as:  PLAVIX   colchicine 0.6 MG tablet     TAKE these medications   amiodarone 200 MG tablet Commonly known as:  PACERONE Take two tablets daily for  heart rhythm   apixaban 5 MG Tabs tablet Commonly known as:  ELIQUIS Take 1 tablet (5 mg total) by mouth 2 (two) times daily.   bisacodyl 5 MG EC tablet Commonly  known as:  DULCOLAX Take 5 mg by mouth daily as needed for moderate constipation.   clobetasol 0.05 % external solution Commonly known as:  TEMOVATE Apply 1 application topically. Apply once or twice daily on scaple   clonazePAM 0.5 MG tablet Commonly known as:  KLONOPIN Take 1 tablet (0.5 mg total) by mouth at bedtime. Take 1 tablet by mouth every 4 hours as needed for anxiety/restless legs   levothyroxine 137 MCG tablet Commonly known as:  SYNTHROID, LEVOTHROID Take 137 mcg by mouth daily before breakfast.   lubiprostone 8 MCG capsule Commonly known as:  AMITIZA Take 8 mcg by mouth 2 (two) times daily with a meal.   midodrine 5 MG tablet Commonly known as:  PROAMATINE Take 1 tablet (5 mg total) by mouth 3 (three) times daily with meals. Take third dose at least 6 hours before bedtime   pantoprazole 40 MG tablet Commonly known as:  PROTONIX Take 1 at bedtime to reduce stomach acid   pravastatin 40 MG tablet Commonly known as:  PRAVACHOL Take 1 tablet (40 mg total) by mouth daily. To lower cholesterol   rOPINIRole 0.5 MG tablet Commonly known as:  REQUIP Take 1 tablet (0.5 mg total) by mouth 2 (two) times daily. What changed:  when to take this  additional instructions   tamsulosin 0.4 MG Caps capsule Commonly known as:  FLOMAX TAKE ONE CAPSULE DAILY TO HELP IMPROVE URINARY FREQUENCY.   torsemide 20 MG tablet Commonly known as:  DEMADEX Take 1 tablets daily if you gain 3 or more pounds weight. Stop when weight returns to normal..   traZODone 50 MG tablet Commonly known as:  DESYREL TAKE 1 TABLET AT BEDTIME AS NEEDED.   Vitamin D3 5000 units Caps Take 1 capsule by mouth every Monday. Reported on 02/22/2015      Follow-up Information    Adam Hubert, MD. Schedule an appointment as soon as possible for a visit in 1 week(s).   Specialty:  Internal Medicine Contact information: [place] [address] [phone]          Allergies   Allergen Reactions  . Crestor [Rosuvastatin Calcium]     Unknown reaction   . Altace [Ramipril] Other (See Comments)    Cough  . Bactrim [Sulfamethoxazole-Trimethoprim] Rash  . Penicillins Rash    Has patient had a PCN reaction causing immediate rash, facial/tongue/throat swelling, SOB or lightheadedness with hypotension: No Has patient had a PCN reaction causing severe rash involving mucus membranes or skin necrosis: Yes Has patient had a PCN reaction that required hospitalization Yes Has patient had a PCN reaction occurring within the last 10 years: No If all of the above answers are "NO", then may proceed with Cephalosporin use.   . Sulfa Antibiotics Rash    Consultations:  Neurology   Procedures/Studies: Ct Angio Head W Or Wo Contrast  Addendum Date: 12/08/2015   ADDENDUM REPORT: 12/08/2015 02:35 ADDENDUM: These results were called by telephone at the time of interpretation on 12/08/2015 at 2:34 am to NP Baltazar Najjar, who verbally acknowledged these results. Electronically Signed   By: Kristine Garbe M.D.   On: 12/08/2015 02:35   Result Date: 12/08/2015 CLINICAL DATA:  82 y/o  M; concern for acute stroke. EXAM: CT ANGIOGRAPHY HEAD AND NECK TECHNIQUE: Multidetector  CT imaging of the head and neck was performed using the standard protocol during bolus administration of intravenous contrast. Multiplanar CT image reconstructions and MIPs were obtained to evaluate the vascular anatomy. CONTRAST:  80 cc Isovue 370 COMPARISON:  12/06/2015 CT head. FINDINGS: CT HEAD FINDINGS Brain: No evidence of acute infarction, hemorrhage, hydrocephalus, extra-axial collection or mass lesion/mass effect. Mild chronic microvascular ischemic changes and parenchymal volume loss for age. Vascular: See below up Skull: Normal. Negative for fracture or focal lesion. Sinuses: Mild mucosal thickening of ethmoid sinuses. Otherwise the visualized paranasal sinuses and mastoid air cells are clear. Orbits: Bilateral  intra-ocular lens replacement. Review of the MIP images confirms the above findings CTA NECK FINDINGS Aortic arch: Standard branching. Imaged portion shows no evidence of aneurysm or dissection. No significant stenosis of the major arch vessel origins. Mild calcific atherosclerosis. Right carotid system: No evidence of dissection, stenosis (50% or greater) or occlusion. Mild calcific atherosclerosis of the bifurcation without significant stenosis. Left carotid system: No evidence of dissection, stenosis (50% or greater) or occlusion. Mild calcific atherosclerosis of the bifurcation without significant stenosis. Vertebral arteries: Codominant. No evidence of dissection, stenosis (50% or greater) or occlusion. Skeleton: Multilevel cervical degenerative changes with grade 1 anterolisthesis of C3-4 and C4-5. Left-sided facet fusion of C3-4. Multilevel mild-to-moderate neural foraminal narrowing. No high-grade canal stenosis identified. Other neck: Unremarkable thyroid gland. No lymphadenopathy, discrete cervical mass, or inflammatory change identified. Patent aerodigestive tract. Postinflammatory calcifications of left adenoid tonsils. Upper chest: Punctate left upper lobe calcified granuloma. Review of the MIP images confirms the above findings CTA HEAD FINDINGS Anterior circulation: No significant stenosis, proximal occlusion, or vascular malformation. Calcific atherosclerosis of cavernous and paraclinoid internal carotid arteries with mild stenosis. No appreciable right A1 segment with large left A1 segment and anterior communicating artery, normal variant. 2 mm superiorly directed outpouching of the bilateral A2 junction may represent a tiny aneurysm or infundibular origin of diminutive vessel (series 13, image 116). Posterior circulation: No significant stenosis, proximal occlusion, aneurysm, or vascular malformation. Venous sinuses: As permitted by contrast timing, patent. Anatomic variants: No posterior  communicating artery is identified, hypoplastic or absent. Delayed phase: No abnormal intracranial enhancement. Review of the MIP images confirms the above findings IMPRESSION: 1. No significant stenosis, proximal occlusion, or vascular malformation of the circle of Willis. 2. 2 mm superiorly directed outpouching of bilateral A2 junction/A-comm may represent tiny aneurysm or infundibular origin of diminutive vessel. 3. No acute intracranial abnormality is identified. If symptoms persist or if clinically indicated MRI of the brain is more sensitive for acute stroke. 4. Stable mild chronic microvascular ischemic changes and parenchymal volume loss for age. Electronically Signed: By: Kristine Garbe M.D. On: 12/08/2015 02:20   Ct Head Wo Contrast  Result Date: 12/06/2015 CLINICAL DATA:  Onset of right leg weakness, unable to walk, dizziness EXAM: CT HEAD WITHOUT CONTRAST TECHNIQUE: Contiguous axial images were obtained from the base of the skull through the vertex without intravenous contrast. COMPARISON:  12/30/2014 FINDINGS: Brain: No acute territorial infarction, intracranial hemorrhage or extra-axial fluid collection. Mild to moderate global atrophy. Minimal periventricular white matter hypodensities consistent with small vessel disease. Mild bilateral basal ganglial calcification. No mass or midline shift. Ventricles similar in size and morphology. Vascular: No hyperdense vessels. Calcifications within the carotid arteries at the skullbase. Skull: Normal. Negative for fracture or focal lesion. Sinuses/Orbits: Mucosal thickening in the ethmoids and left maxillary sinus. No acute orbital abnormality. Other: None IMPRESSION: No CT evidence for acute intracranial abnormality. Electronically  Signed   By: Donavan Foil M.D.   On: 12/06/2015 18:04   Ct Angio Neck W Or Wo Contrast  Addendum Date: 12/08/2015   ADDENDUM REPORT: 12/08/2015 02:35 ADDENDUM: These results were called by telephone at the time of  interpretation on 12/08/2015 at 2:34 am to NP Baltazar Najjar, who verbally acknowledged these results. Electronically Signed   By: Kristine Garbe M.D.   On: 12/08/2015 02:35   Result Date: 12/08/2015 CLINICAL DATA:  82 y/o  M; concern for acute stroke. EXAM: CT ANGIOGRAPHY HEAD AND NECK TECHNIQUE: Multidetector CT imaging of the head and neck was performed using the standard protocol during bolus administration of intravenous contrast. Multiplanar CT image reconstructions and MIPs were obtained to evaluate the vascular anatomy. CONTRAST:  80 cc Isovue 370 COMPARISON:  12/06/2015 CT head. FINDINGS: CT HEAD FINDINGS Brain: No evidence of acute infarction, hemorrhage, hydrocephalus, extra-axial collection or mass lesion/mass effect. Mild chronic microvascular ischemic changes and parenchymal volume loss for age. Vascular: See below up Skull: Normal. Negative for fracture or focal lesion. Sinuses: Mild mucosal thickening of ethmoid sinuses. Otherwise the visualized paranasal sinuses and mastoid air cells are clear. Orbits: Bilateral intra-ocular lens replacement. Review of the MIP images confirms the above findings CTA NECK FINDINGS Aortic arch: Standard branching. Imaged portion shows no evidence of aneurysm or dissection. No significant stenosis of the major arch vessel origins. Mild calcific atherosclerosis. Right carotid system: No evidence of dissection, stenosis (50% or greater) or occlusion. Mild calcific atherosclerosis of the bifurcation without significant stenosis. Left carotid system: No evidence of dissection, stenosis (50% or greater) or occlusion. Mild calcific atherosclerosis of the bifurcation without significant stenosis. Vertebral arteries: Codominant. No evidence of dissection, stenosis (50% or greater) or occlusion. Skeleton: Multilevel cervical degenerative changes with grade 1 anterolisthesis of C3-4 and C4-5. Left-sided facet fusion of C3-4. Multilevel mild-to-moderate neural foraminal  narrowing. No high-grade canal stenosis identified. Other neck: Unremarkable thyroid gland. No lymphadenopathy, discrete cervical mass, or inflammatory change identified. Patent aerodigestive tract. Postinflammatory calcifications of left adenoid tonsils. Upper chest: Punctate left upper lobe calcified granuloma. Review of the MIP images confirms the above findings CTA HEAD FINDINGS Anterior circulation: No significant stenosis, proximal occlusion, or vascular malformation. Calcific atherosclerosis of cavernous and paraclinoid internal carotid arteries with mild stenosis. No appreciable right A1 segment with large left A1 segment and anterior communicating artery, normal variant. 2 mm superiorly directed outpouching of the bilateral A2 junction may represent a tiny aneurysm or infundibular origin of diminutive vessel (series 13, image 116). Posterior circulation: No significant stenosis, proximal occlusion, aneurysm, or vascular malformation. Venous sinuses: As permitted by contrast timing, patent. Anatomic variants: No posterior communicating artery is identified, hypoplastic or absent. Delayed phase: No abnormal intracranial enhancement. Review of the MIP images confirms the above findings IMPRESSION: 1. No significant stenosis, proximal occlusion, or vascular malformation of the circle of Willis. 2. 2 mm superiorly directed outpouching of bilateral A2 junction/A-comm may represent tiny aneurysm or infundibular origin of diminutive vessel. 3. No acute intracranial abnormality is identified. If symptoms persist or if clinically indicated MRI of the brain is more sensitive for acute stroke. 4. Stable mild chronic microvascular ischemic changes and parenchymal volume loss for age. Electronically Signed: By: Kristine Garbe M.D. On: 12/08/2015 02:20   Ct Lumbar Spine Wo Contrast  Result Date: 12/06/2015 CLINICAL DATA:  Right leg weakness pain in the low back EXAM: CT LUMBAR SPINE WITHOUT CONTRAST  TECHNIQUE: Multidetector CT imaging of the lumbar spine was performed without intravenous  contrast administration. Multiplanar CT image reconstructions were also generated. COMPARISON:  12/29/2014 FINDINGS: Segmentation: Transitional anatomy is present. The first non rib-bearing vertebra will be designated L1. Transitional vertebra will be labeled L6/transitional vertebra. Alignment: Mild retrolisthesis of L2 on L3 and L3 on L4. Vertebrae: Minimal chronic appearing wedge deformities of L1 and L2. No acute fracture. Bilateral hyper trophic facet disease from L4 through the upper sacrum. Suspected disc bulges at L3-L4, L4-L5, L5-L6. No fatty effacement of the fat planes around the exiting nerve roots. Paraspinal and other soft tissues: Fatty atrophy of the paraspinal muscles. Atherosclerotic vascular disease of the aorta. Partially visualized bilateral renal cyst, on the right, the largest measures 2.9 cm, on the left this measures up to 5.1 cm. Disc levels: Moderate degenerative changes at L1-L2, L2-L3, L3-L4 with endplate changes and vacuum disc. IMPRESSION: 1. Suspected transitional anatomy of the lumbosacral spine. No acute fracture. Mild retrolisthesis of L2 on L3 and L3 on L4. 2. Moderate multilevel degenerative disc disease. 3. Bilateral renal cysts. Electronically Signed   By: Donavan Foil M.D.   On: 12/06/2015 20:32   Dg Chest Port 1 View  Result Date: 12/08/2015 CLINICAL DATA:  Shortness of breath, epigastric pain EXAM: PORTABLE CHEST 1 VIEW COMPARISON:  None. FINDINGS: Patchy lingular and bibasilar opacities, atelectasis versus pneumonia. No frank interstitial edema.  No pleural effusion or pneumothorax. Cardiomegaly. Postsurgical changes related to prior CABG. Left subclavian pacemaker. Moderate to large hiatal hernia. IMPRESSION: Patchy lingular and bibasilar opacities, atelectasis versus pneumonia. Moderate to large hiatal hernia. Electronically Signed   By: Julian Hy M.D.   On: 12/08/2015  13:13      Subjective: Patient has no deficits. No concerns today  Discharge Exam: Vitals:   12/08/15 1033 12/08/15 1155  BP: 112/61 116/68  Pulse: 70 67  Resp:    Temp: 98.1 F (36.7 C) 97.7 F (36.5 C)   Vitals:   12/08/15 0056 12/08/15 0634 12/08/15 1033 12/08/15 1155  BP: 112/64 110/63 112/61 116/68  Pulse: 69 70 70 67  Resp: 16 16    Temp: 98.3 F (36.8 C) 97.6 F (36.4 C) 98.1 F (36.7 C) 97.7 F (36.5 C)  TempSrc: Oral Oral Oral Axillary  SpO2: 94% 97% 98% 99%  Weight:      Height:        General exam: Appears calm and comfortable  Respiratory system: Clear to auscultation. Respiratory effort normal. Cardiovascular system: S1 & S2 heard, RRR. 2/6 systolic murmur. Gastrointestinal system: Abdomen is nondistended, soft and nontender. Normal bowel sounds heard. Central nervous system: Alert and oriented. No focal neurological deficits. Extremities: No edema. No calf tenderness Skin: No cyanosis. No rashes Psychiatry: Judgement and insight appear normal. Mood & affect appropriate.   The results of significant diagnostics from this hospitalization (including imaging, microbiology, ancillary and laboratory) are listed below for reference.     Microbiology: No results found for this or any previous visit (from the past 240 hour(s)).   Labs: BNP (last 3 results) No results for input(s): BNP in the last 8760 hours. Basic Metabolic Panel:  Recent Labs Lab 12/06/15 1617  NA 139  K 4.0  CL 105  CO2 28  GLUCOSE 114*  BUN 12  CREATININE 0.85  CALCIUM 9.0   Liver Function Tests:  Recent Labs Lab 12/06/15 1617  AST 27  ALT 24  ALKPHOS 60  BILITOT 0.5  PROT 6.2*  ALBUMIN 3.4*   No results for input(s): LIPASE, AMYLASE in the last 168 hours. No  results for input(s): AMMONIA in the last 168 hours. CBC:  Recent Labs Lab 12/06/15 1617  WBC 5.4  NEUTROABS 3.3  HGB 13.5  HCT 41.4  MCV 98.6  PLT 183   Cardiac Enzymes:  Recent Labs Lab  12/08/15 1338  TROPONINI <0.03   BNP: Invalid input(s): POCBNP CBG: No results for input(s): GLUCAP in the last 168 hours. D-Dimer No results for input(s): DDIMER in the last 72 hours. Hgb A1c  Recent Labs  12/07/15 0449  HGBA1C 5.6   Lipid Profile  Recent Labs  12/07/15 0449  CHOL 84  HDL 41  LDLCALC 23  TRIG 102  CHOLHDL 2.0   Thyroid function studies No results for input(s): TSH, T4TOTAL, T3FREE, THYROIDAB in the last 72 hours.  Invalid input(s): FREET3 Anemia work up No results for input(s): VITAMINB12, FOLATE, FERRITIN, TIBC, IRON, RETICCTPCT in the last 72 hours. Urinalysis    Component Value Date/Time   COLORURINE YELLOW 12/06/2015 2132   APPEARANCEUR CLEAR 12/06/2015 2132   LABSPEC 1.008 12/06/2015 2132   PHURINE 7.0 12/06/2015 2132   GLUCOSEU NEGATIVE 12/06/2015 2132   HGBUR NEGATIVE 12/06/2015 2132   BILIRUBINUR NEGATIVE 12/06/2015 2132   KETONESUR NEGATIVE 12/06/2015 2132   PROTEINUR NEGATIVE 12/06/2015 2132   UROBILINOGEN 1.0 12/06/2014 1152   NITRITE NEGATIVE 12/06/2015 2132   LEUKOCYTESUR NEGATIVE 12/06/2015 2132   Sepsis Labs Invalid input(s): PROCALCITONIN,  WBC,  LACTICIDVEN Microbiology No results found for this or any previous visit (from the past 240 hour(s)).   Time coordinating discharge: Over 30 minutes  SIGNED:   Cordelia Poche, MD [facility] 12/08/2015, 4:13 PM Pager [phone]  If 7PM-7AM, please contact night-coverage [email or web address] Password [email or web address]

## 2015-12-08 NOTE — Progress Notes (Signed)
Discharge orders received.  Discharge instructions and follow-up appointments reviewed with the patient, his wife, and daughter.  VSS upon discharge.  IV removed and education complete.  All belongings sent with the patient.  Transported out via wheelchair.  Cori Razor, RN

## 2015-12-08 NOTE — Care Management Note (Signed)
Case Management Note  Patient Details  Name: Adam [name] MRN: [id number] Date of Birth: [birth year]  Subjective/Objective:                    Action/Plan: Pt discharging back to [facility]. Pt with orders for [facility] services. CM spoke to the patient and his wife and they would like to use the therapy services through [facility]. CM called and left a message with the therapy services and spoke to the RN for the IL[facility]. Information requested faxed to the RN per her request. Will update the bedside RN.   Expected Discharge Date:                  Expected Discharge Plan:  [place]  In-House Referral:     Discharge planning Services  CM Consult  Post Acute Care Choice:  Home Health Choice offered to:  Patient, Spouse  DME Arranged:    DME Agency:     HH Arranged:  PT, OT HH Agency:   ([facility] services.)  Status of Service:  Completed, signed off  If discussed at [employer], dates discussed:    Additional Comments:  Pollie Friar, RN 12/08/2015, 4:29 PM

## 2015-12-08 NOTE — Progress Notes (Signed)
Radiologist paged NP with results of CTA head/neck. No significant stenosis noted and no acute infarct. Radiologist advised MRI if need futher evaluation for stroke. NP reviewed chart. Stroke team is following and stroke workup is underway. Pt is anticoagulated. Report left for attending.  KJKG, NP [facility]

## 2015-12-08 NOTE — Progress Notes (Signed)
Occupational Therapy Treatment Patient Details Name: Adam [name] MRN: [id number] DOB: 10-16-1926 Today's Date: 12/08/2015    History of present illness 82 y.o.malewith medical history significant of congestive cardiomyopathy, combined systolic and diastolic heart failure, CAD, HTN, postural hypotension,intermittent weakness for which he's been followed by neurology with cerebral atrophy and cerebellar atrophy on CT scan, GERD and constipation, RBBB. Pt presenting with R LE weakness. CT on 11/1 negative for acute intracranial abnormality. CT lumbar spine with Moderate multilevel degenerative disc disease (L1-L5); MRA pending   OT comments  Pt declining OOB activity at this time due to fatigue but agreeable to participate in R hand exercises and self feeding activity. Educated pt on R hand theraputty exercises; pt able to return demo, provided super soft theraputty to pt. Also provided red foam for increased independence with self feeding. Pt able to demo use of foam on utensil to self feed. D/c plan remains appropriate. Will continue to follow acutely.   Follow Up Recommendations  Home health OT;Supervision/Assistance - 24 hour    Equipment Recommendations  Tub/shower seat    Recommendations for Other Services      Precautions / Restrictions Precautions Precautions: Fall Restrictions Weight Bearing Restrictions: No       Mobility Bed Mobility               General bed mobility comments: Not assessed  Transfers                 General transfer comment: Not assessed    Balance                                   ADL Overall ADL's : Needs assistance/impaired Eating/Feeding: Set up;Bed level;With adaptive utensils Eating/Feeding Details (indicate cue type and reason): Educated pt on use of red foam for built up handle. Pt able to demo self feeding with applesauce.                                   General ADL Comments: Pt declined  OOB activity at this time due to fatigue; reports he did not get a good night sleep last night.       Vision                     Perception     Praxis      Cognition   Behavior During Therapy: WFL for tasks assessed/performed Overall Cognitive Status: Within Functional Limits for tasks assessed                       Extremity/Trunk Assessment               Exercises Other Exercises Other Exercises: Educated pt on R hand theraputty exercises and provided with super soft putty. Pt able to return demo exercises.   Shoulder Instructions       General Comments      Pertinent Vitals/ Pain       Pain Assessment: No/denies pain Faces Pain Scale: No hurt  Home Living     Available Help at Discharge: Family;Available 24 hours/day Type of Home: Independent living facility                              Lives With: Spouse  Prior Functioning/Environment              Frequency  Min 2X/week        Progress Toward Goals  OT Goals(current goals can now be found in the care plan section)  Progress towards OT goals: Progressing toward goals  Acute Rehab OT Goals Patient Stated Goal: return home OT Goal Formulation: With patient/family  Plan Discharge plan remains appropriate    Co-evaluation                 End of Session     Activity Tolerance Patient tolerated treatment well   Patient Left in bed;with call bell/phone within reach;with bed alarm set;with family/visitor present   Nurse Communication          Time: [id number] OT Time Calculation (min): 20 min  Charges: OT General Charges $OT Visit: 1 Procedure OT Treatments $Therapeutic Activity: 8-22 mins  Binnie Kand M.S., OTR/L Pager: [phone]  12/08/2015, 10:34 AM

## 2015-12-08 NOTE — Progress Notes (Signed)
STROKE TEAM PROGRESS NOTE   HISTORY OF PRESENT ILLNESS (per record) Adam [name] is an 82 yo male with extensive cardiac history presents with RLE weakness that started at noon today. He states he had some severe back pain a few days ago but that resolved. He was still able to walk today using his walker. Patient was not administered IV t-PA . He was admitted for further evaluation and treatment.   SUBJECTIVE (INTERVAL HISTORY) His wife and daughter are at the bedside.  Overall he feels his condition is stable. He reports he still has right lower extremity weakness. [facility].   OBJECTIVE Temp:  [97.6 F (36.4 C)-98.4 F (36.9 C)] 97.7 F (36.5 C) (11/03 1155) Pulse Rate:  [67-70] 67 (11/03 1155) Cardiac Rhythm: A-V Sequential paced;Bundle branch block (11/03 0700) Resp:  [16-20] 16 (11/03 0634) BP: (108-119)/(57-68) 116/68 (11/03 1155) SpO2:  [94 %-99 %] 99 % (11/03 1155)  CBC:   Recent Labs Lab 12/06/15 1617  WBC 5.4  NEUTROABS 3.3  HGB 13.5  HCT 41.4  MCV 98.6  PLT [id number]    Basic Metabolic Panel:   Recent Labs Lab 12/06/15 1617  NA 139  K 4.0  CL 105  CO2 28  GLUCOSE 114*  BUN 12  CREATININE 0.85  CALCIUM 9.0    Lipid Panel:     Component Value Date/Time   CHOL 84 12/07/2015 0449   TRIG 102 12/07/2015 0449   HDL 41 12/07/2015 0449   CHOLHDL 2.0 12/07/2015 0449   VLDL 20 12/07/2015 0449   LDLCALC 23 12/07/2015 0449   HgbA1c:  Lab Results  Component Value Date   HGBA1C 5.6 12/07/2015   Urine Drug Screen:     Component Value Date/Time   LABOPIA NONE DETECTED 12/06/2015 2132   COCAINSCRNUR NONE DETECTED 12/06/2015 2132   LABBENZ NONE DETECTED 12/06/2015 2132   AMPHETMU NONE DETECTED 12/06/2015 2132   THCU NONE DETECTED 12/06/2015 2132   LABBARB NONE DETECTED 12/06/2015 2132      IMAGING  Ct Angio Head W Or Wo Contrast  Addendum Date: 12/08/2015   ADDENDUM REPORT: 12/08/2015 02:35 ADDENDUM: These  results were called by telephone at the time of interpretation on 12/08/2015 at 2:34 am to NP Baltazar Najjar, who verbally acknowledged these results. Electronically Signed   By: Kristine Garbe M.D.   On: 12/08/2015 02:35   Result Date: 12/08/2015 CLINICAL DATA:  82 y/o  M; concern for acute stroke. EXAM: CT ANGIOGRAPHY HEAD AND NECK TECHNIQUE: Multidetector CT imaging of the head and neck was performed using the standard protocol during bolus administration of intravenous contrast. Multiplanar CT image reconstructions and MIPs were obtained to evaluate the vascular anatomy. CONTRAST:  80 cc Isovue 370 COMPARISON:  12/06/2015 CT head. FINDINGS: CT HEAD FINDINGS Brain: No evidence of acute infarction, hemorrhage, hydrocephalus, extra-axial collection or mass lesion/mass effect. Mild chronic microvascular ischemic changes and parenchymal volume loss for age. Vascular: See below up Skull: Normal. Negative for fracture or focal lesion. Sinuses: Mild mucosal thickening of ethmoid sinuses. Otherwise the visualized paranasal sinuses and mastoid air cells are clear. Orbits: Bilateral intra-ocular lens replacement. Review of the MIP images confirms the above findings CTA NECK FINDINGS Aortic arch: Standard branching. Imaged portion shows no evidence of aneurysm or dissection. No significant stenosis of the major arch vessel origins. Mild calcific atherosclerosis. Right carotid system: No evidence of dissection, stenosis (50% or greater) or occlusion. Mild calcific atherosclerosis of the bifurcation  without significant stenosis. Left carotid system: No evidence of dissection, stenosis (50% or greater) or occlusion. Mild calcific atherosclerosis of the bifurcation without significant stenosis. Vertebral arteries: Codominant. No evidence of dissection, stenosis (50% or greater) or occlusion. Skeleton: Multilevel cervical degenerative changes with grade 1 anterolisthesis of C3-4 and C4-5. Left-sided facet fusion of C3-4.  Multilevel mild-to-moderate neural foraminal narrowing. No high-grade canal stenosis identified. Other neck: Unremarkable thyroid gland. No lymphadenopathy, discrete cervical mass, or inflammatory change identified. Patent aerodigestive tract. Postinflammatory calcifications of left adenoid tonsils. Upper chest: Punctate left upper lobe calcified granuloma. Review of the MIP images confirms the above findings CTA HEAD FINDINGS Anterior circulation: No significant stenosis, proximal occlusion, or vascular malformation. Calcific atherosclerosis of cavernous and paraclinoid internal carotid arteries with mild stenosis. No appreciable right A1 segment with large left A1 segment and anterior communicating artery, normal variant. 2 mm superiorly directed outpouching of the bilateral A2 junction may represent a tiny aneurysm or infundibular origin of diminutive vessel (series 13, image 116). Posterior circulation: No significant stenosis, proximal occlusion, aneurysm, or vascular malformation. Venous sinuses: As permitted by contrast timing, patent. Anatomic variants: No posterior communicating artery is identified, hypoplastic or absent. Delayed phase: No abnormal intracranial enhancement. Review of the MIP images confirms the above findings IMPRESSION: 1. No significant stenosis, proximal occlusion, or vascular malformation of the circle of Willis. 2. 2 mm superiorly directed outpouching of bilateral A2 junction/A-comm may represent tiny aneurysm or infundibular origin of diminutive vessel. 3. No acute intracranial abnormality is identified. If symptoms persist or if clinically indicated MRI of the brain is more sensitive for acute stroke. 4. Stable mild chronic microvascular ischemic changes and parenchymal volume loss for age. Electronically Signed: By: Kristine Garbe M.D. On: 12/08/2015 02:20   Ct Head Wo Contrast  Result Date: 12/06/2015 CLINICAL DATA:  Onset of right leg weakness, unable to walk,  dizziness EXAM: CT HEAD WITHOUT CONTRAST TECHNIQUE: Contiguous axial images were obtained from the base of the skull through the vertex without intravenous contrast. COMPARISON:  12/30/2014 FINDINGS: Brain: No acute territorial infarction, intracranial hemorrhage or extra-axial fluid collection. Mild to moderate global atrophy. Minimal periventricular white matter hypodensities consistent with small vessel disease. Mild bilateral basal ganglial calcification. No mass or midline shift. Ventricles similar in size and morphology. Vascular: No hyperdense vessels. Calcifications within the carotid arteries at the skullbase. Skull: Normal. Negative for fracture or focal lesion. Sinuses/Orbits: Mucosal thickening in the ethmoids and left maxillary sinus. No acute orbital abnormality. Other: None IMPRESSION: No CT evidence for acute intracranial abnormality. Electronically Signed   By: Donavan Foil M.D.   On: 12/06/2015 18:04   Ct Angio Neck W Or Wo Contrast  Addendum Date: 12/08/2015   ADDENDUM REPORT: 12/08/2015 02:35 ADDENDUM: These results were called by telephone at the time of interpretation on 12/08/2015 at 2:34 am to NP Baltazar Najjar, who verbally acknowledged these results. Electronically Signed   By: Kristine Garbe M.D.   On: 12/08/2015 02:35   Result Date: 12/08/2015 CLINICAL DATA:  82 y/o  M; concern for acute stroke. EXAM: CT ANGIOGRAPHY HEAD AND NECK TECHNIQUE: Multidetector CT imaging of the head and neck was performed using the standard protocol during bolus administration of intravenous contrast. Multiplanar CT image reconstructions and MIPs were obtained to evaluate the vascular anatomy. CONTRAST:  80 cc Isovue 370 COMPARISON:  12/06/2015 CT head. FINDINGS: CT HEAD FINDINGS Brain: No evidence of acute infarction, hemorrhage, hydrocephalus, extra-axial collection or mass lesion/mass effect. Mild chronic microvascular ischemic changes and parenchymal  volume loss for age. Vascular: See below up Skull:  Normal. Negative for fracture or focal lesion. Sinuses: Mild mucosal thickening of ethmoid sinuses. Otherwise the visualized paranasal sinuses and mastoid air cells are clear. Orbits: Bilateral intra-ocular lens replacement. Review of the MIP images confirms the above findings CTA NECK FINDINGS Aortic arch: Standard branching. Imaged portion shows no evidence of aneurysm or dissection. No significant stenosis of the major arch vessel origins. Mild calcific atherosclerosis. Right carotid system: No evidence of dissection, stenosis (50% or greater) or occlusion. Mild calcific atherosclerosis of the bifurcation without significant stenosis. Left carotid system: No evidence of dissection, stenosis (50% or greater) or occlusion. Mild calcific atherosclerosis of the bifurcation without significant stenosis. Vertebral arteries: Codominant. No evidence of dissection, stenosis (50% or greater) or occlusion. Skeleton: Multilevel cervical degenerative changes with grade 1 anterolisthesis of C3-4 and C4-5. Left-sided facet fusion of C3-4. Multilevel mild-to-moderate neural foraminal narrowing. No high-grade canal stenosis identified. Other neck: Unremarkable thyroid gland. No lymphadenopathy, discrete cervical mass, or inflammatory change identified. Patent aerodigestive tract. Postinflammatory calcifications of left adenoid tonsils. Upper chest: Punctate left upper lobe calcified granuloma. Review of the MIP images confirms the above findings CTA HEAD FINDINGS Anterior circulation: No significant stenosis, proximal occlusion, or vascular malformation. Calcific atherosclerosis of cavernous and paraclinoid internal carotid arteries with mild stenosis. No appreciable right A1 segment with large left A1 segment and anterior communicating artery, normal variant. 2 mm superiorly directed outpouching of the bilateral A2 junction may represent a tiny aneurysm or infundibular origin of diminutive vessel (series 13, image 116).  Posterior circulation: No significant stenosis, proximal occlusion, aneurysm, or vascular malformation. Venous sinuses: As permitted by contrast timing, patent. Anatomic variants: No posterior communicating artery is identified, hypoplastic or absent. Delayed phase: No abnormal intracranial enhancement. Review of the MIP images confirms the above findings IMPRESSION: 1. No significant stenosis, proximal occlusion, or vascular malformation of the circle of Willis. 2. 2 mm superiorly directed outpouching of bilateral A2 junction/A-comm may represent tiny aneurysm or infundibular origin of diminutive vessel. 3. No acute intracranial abnormality is identified. If symptoms persist or if clinically indicated MRI of the brain is more sensitive for acute stroke. 4. Stable mild chronic microvascular ischemic changes and parenchymal volume loss for age. Electronically Signed: By: Kristine Garbe M.D. On: 12/08/2015 02:20   Ct Lumbar Spine Wo Contrast  Result Date: 12/06/2015 CLINICAL DATA:  Right leg weakness pain in the low back EXAM: CT LUMBAR SPINE WITHOUT CONTRAST TECHNIQUE: Multidetector CT imaging of the lumbar spine was performed without intravenous contrast administration. Multiplanar CT image reconstructions were also generated. COMPARISON:  12/29/2014 FINDINGS: Segmentation: Transitional anatomy is present. The first non rib-bearing vertebra will be designated L1. Transitional vertebra will be labeled L6/transitional vertebra. Alignment: Mild retrolisthesis of L2 on L3 and L3 on L4. Vertebrae: Minimal chronic appearing wedge deformities of L1 and L2. No acute fracture. Bilateral hyper trophic facet disease from L4 through the upper sacrum. Suspected disc bulges at L3-L4, L4-L5, L5-L6. No fatty effacement of the fat planes around the exiting nerve roots. Paraspinal and other soft tissues: Fatty atrophy of the paraspinal muscles. Atherosclerotic vascular disease of the aorta. Partially visualized  bilateral renal cyst, on the right, the largest measures 2.9 cm, on the left this measures up to 5.1 cm. Disc levels: Moderate degenerative changes at L1-L2, L2-L3, L3-L4 with endplate changes and vacuum disc. IMPRESSION: 1. Suspected transitional anatomy of the lumbosacral spine. No acute fracture. Mild retrolisthesis of L2 on L3 and L3 on  L4. 2. Moderate multilevel degenerative disc disease. 3. Bilateral renal cysts. Electronically Signed   By: Donavan Foil M.D.   On: 12/06/2015 20:32   Dg Chest Port 1 View  Result Date: 12/08/2015 CLINICAL DATA:  Shortness of breath, epigastric pain EXAM: PORTABLE CHEST 1 VIEW COMPARISON:  None. FINDINGS: Patchy lingular and bibasilar opacities, atelectasis versus pneumonia. No frank interstitial edema.  No pleural effusion or pneumothorax. Cardiomegaly. Postsurgical changes related to prior CABG. Left subclavian pacemaker. Moderate to large hiatal hernia. IMPRESSION: Patchy lingular and bibasilar opacities, atelectasis versus pneumonia. Moderate to large hiatal hernia. Electronically Signed   By: Julian Hy M.D.   On: 12/08/2015 13:13       PHYSICAL EXAM Pleasant elderly obese Caucasian male not in distress. . Afebrile. Head is nontraumatic. Neck is supple without bruit.    Cardiac exam no murmur or gallop. Lungs are clear to auscultation. Distal pulses are well felt. He has multiple ecchymosis and petechiae on his extremities. Neurological Exam :  He is awake alert oriented 3 with normal speech and language function. Pupils are equal reactive fundi are not visualized. Vision acuity and fields seem normal. Face is symmetric without weakness. Tongue is midline. He has slight diminished glabellar tap and decreased facial expression. Motor system exam reveals no resting or action tremor. He has mild right hand and intrinsic muscle weakness. He has grade 3/5 right lower extremity weakness and weakness proximally in the hip vigorous and distally in the ankle  dorsiflexors. The subjective diminished touch pinprick sensation in the right leg from the knee down.  Reflexes are 1+ symmetric plantars are downgoing. Gait was not tested. ASSESSMENT/PLAN Mr. Adam [name] is a 82 y.o. male with history of congestive cardiomyopathy, combined systolic and diastolic heart failure, CAD, HTN, postural hypotension, intermittent weakness presenting with RLE weakness and numbness. He did not receive IV t-PA.   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 Workup underway.   Resultant  Improvement in RLE symptos  MRI  / MRA  Pacer  Initial CT without stroke  CTA head and neck ordered  2D Echo  pending   LDL 23  HgbA1c pending  SCDs for VTE prophylaxis Diet Heart Room service appropriate? Yes; Fluid consistency: Thin  aspirin 81 mg daily and clopidogrel 75 mg daily prior to admission, now on aspirin 81 mg daily and clopidogrel 75 mg daily  Patient counseled to be compliant with his antithrombotic medications  Ongoing aggressive stroke risk factor management  Therapy recommendations:  HH PT and OT  Disposition:  Return to [facility] (lives at [facility])  He is followed by Dr. Rexene Alberts as an OP  Paroxysmal Atrial Fibrillation  On coumadin in the past Her family  Home anticoagulation:  None at this time   No recent falls in the past year, per family   Followed by Dr. Danford Bad    Hypertension  Stable  Long-term BP goal normotensive  Hyperlipidemia  Home meds:  Pravachol 40, resumed in hospital  LDL 23, goal < 70  Continue statin at discharge  Other Stroke Risk Factors  Advanced age  Coronary artery disease status post CABG, stents  Ischemic cardiomyopathy  Combined congestive systolic and diastolic heart failure  Other Active Problems  Baseline. Progressive dementia  Sustained ventricular tachycardia, has pacer   Headache, likely secondary to sinus disease seen on  CT  Right hand weakness, chronic. Family reports history of carpal tunnel. Has received outpatient  OT. Followed by Dr. Rollene Rotunda day # [place] for Pager information 12/08/2015 3:39 PM  I have personally examined this patient, reviewed notes, independently viewed imaging studies, participated in medical decision making and plan of care.ROS completed by me personally and pertinent positives fully documented  I have made any additions or clarifications directly to the above note. Agree with note above. Marland Kitchen He has presented with sudden onset of right leg weakness and numbness likely due to a small left hemispheric infarct etiology probably embolic from atrial fibrillation. Marland Kitchen He needs long-term anticoagulation. I and discuss the risk-benefit with the patient, wife and daughter as well as with his cardiologist Dr.Croituru and plan to continue eliquis  . Greater than 50% of time during this 15 visit was spent on counseling and coordination of care about atrial fibrillation, stroke risk, prevention and treatment and bleeding risk with eliquis. DC home with therapy needs Stroke team will sign off. Call for questions.F/U in 6 weeks in stroke clinic Antony Contras, [place] Pager: [phone] 12/08/2015 3:39 PM  To contact Stroke Continuity provider, please refer to [email or web address] After hours, contact General Neurology

## 2015-12-08 NOTE — Progress Notes (Signed)
Occupational Therapy Treatment Patient Details Name: Adam [name] MRN: [id number] DOB: 03/30/1926 Today's Date: 12/08/2015    History of present illness 82 y.o.malewith medical history significant of congestive cardiomyopathy, combined systolic and diastolic heart failure, CAD, HTN, postural hypotension,intermittent weakness for which he's been followed by neurology with cerebral atrophy and cerebellar atrophy on CT scan, GERD and constipation, RBBB. Pt presenting with R LE weakness. CT on 11/1 negative for acute intracranial abnormality. CT lumbar spine with Moderate multilevel degenerative disc disease (L1-L5); MRA pending   OT comments  Per family request; pt seen for second session today for education/review of RUE exercises due to pt fatigue during AM session. Pt tolerated participation in theraputty exercises with R hand. D/c plan remains appropriate. Will continue to follow acutely.   Follow Up Recommendations  Home health OT;Supervision/Assistance - 24 hour    Equipment Recommendations  Tub/shower seat    Recommendations for Other Services      Precautions / Restrictions Precautions Precautions: Fall Restrictions Weight Bearing Restrictions: No       Mobility Bed Mobility               General bed mobility comments: Pt OOB in chair upon arrival.  Transfers Overall transfer level: Needs assistance Equipment used: 4-wheeled walker Transfers: Sit to/from Stand Sit to Stand: Min guard         General transfer comment: Not assessed    Balance Overall balance assessment: Needs assistance Sitting-balance support: No upper extremity supported;Feet supported Sitting balance-Leahy Scale: Fair     Standing balance support: No upper extremity supported Standing balance-Leahy Scale: Poor Standing balance comment: min assist for upright posture                    ADL                                         General ADL Comments: Pt  OOB in chair. Reviewed theraputty exercises with pt and family; pt able to return demo understanding of exercises.      Vision                     Perception     Praxis      Cognition   Behavior During Therapy: [facility] for tasks assessed/performed Overall Cognitive Status: Within Functional Limits for tasks assessed                       Extremity/Trunk Assessment               Exercises General Exercises - Lower Extremity Ankle Circles/Pumps: AROM;Right;10 reps Long Arc Quad: AROM;Right;10 reps Other Exercises Other Exercises: sit to stand x 3 reps consecutively for strengthening; use of UEs to come to stand; hands on thighs to descend slowly   Shoulder Instructions       General Comments      Pertinent Vitals/ Pain       Pain Assessment: Faces Faces Pain Scale: No hurt Pain Location: lt shoulder Pain Descriptors / Indicators: Discomfort Pain Intervention(s): Limited activity within patient's tolerance;Monitored during session  Home Living                                          Prior Functioning/Environment  Frequency  Min 2X/week        Progress Toward Goals  OT Goals(current goals can now be found in the care plan section)  Progress towards OT goals: Progressing toward goals  Acute Rehab OT Goals Patient Stated Goal: return home OT Goal Formulation: With patient/family  Plan Discharge plan remains appropriate    Co-evaluation                 End of Session     Activity Tolerance Patient tolerated treatment well   Patient Left in chair;with call bell/phone within reach;with chair alarm set;with family/visitor present   Nurse Communication          Time: [id number] OT Time Calculation (min): 20 min  Charges: OT General Charges $OT Visit: 1 Procedure OT Treatments $Therapeutic Exercise: 8-22 mins  Binnie Kand M.S., OTR/L Pager: [phone]  12/08/2015, 4:40 PM

## 2015-12-08 NOTE — H&P (Signed)
Speech Language Pathology Evaluation Patient Details Name: Adam [name] MRN: [id number] DOB: [birth year] Today's Date: 12/08/2015 Time: [id number] SLP Time Calculation (min) (ACUTE ONLY): 26 min  Problem List:  Patient Active Problem List   Diagnosis Date Noted  . Right leg weakness 12/06/2015  . Urinary tract infection 04/06/2015  . Insomnia 03/23/2015  . Gout 02/09/2015  . Depression with anxiety 02/02/2015  . Restless leg 02/02/2015  . Combined congestive systolic and diastolic heart failure ([place]) 02/02/2015  . Urinary frequency 02/02/2015  . Pressure ulcer 01/01/2015  . Urinary retention 12/30/2014  . Coronary artery disease with hx of myocardial infarct w/o hx of CABG 12/30/2014  . Expressive aphasia 12/30/2014  . Constipation   . Orthostatic hypotension   . Chronic knee pain 12/03/2014  . GERD (gastroesophageal reflux disease)   . Hypertension   . Dyspnea 08/11/2014  . Hypothyroidism 08/11/2014  . Sick sinus syndrome ([place]) 01/31/2014  . Hyperlipidemia 01/30/2014  . Cardiomyopathy, ischemic 08/25/2012  . Pacemaker 08/25/2012  . Dizziness, after diuretic asscoiated with hypotension and responded to fluid bolus 06/05/2011  . Bradycardia    Past Medical History:  Past Medical History:  Diagnosis Date  . Arthritis    "minor, back and sometimes knees" (12/15/2012)  . Bradycardia    AFib/SSS s/p St Jude PPM 04/12/2008  . CAD (coronary artery disease)    CABG (LIMA-LAD, SVG-RCA, SVG-OM in 1996).  07/2009 BMS to SVG-RCA. Cath in 04/2010 with patent stents   . CHF (congestive heart failure) ([place])   . Exertional shortness of breath    "sometimes walking" (12/15/2012)  . GERD (gastroesophageal reflux disease)   . Heart murmur    "just told I had one today" (12/15/2012)  . Hiatal hernia   . Hyperlipidemia   . Hypertension   . Hypothyroid   . Insomnia   . Melanoma of back ([place]) 1976  . Myocardial infarction 1996; 2011   "both silent" (12/15/2012)  . Osteoporosis, senile    . Pacemaker   . RBBB   . [facility] spotted fever   . S/P CABG x 4   . Sustained ventricular tachycardia ([place]) 07/27/2014   Past Surgical History:  Past Surgical History:  Procedure Laterality Date  . CARDIAC CATHETERIZATION  04/2010   LIMA to LAD patent,SVG to OM patent,no in-stnet restenosis RCA  . CATARACT EXTRACTION W/ INTRAOCULAR LENS  IMPLANT, BILATERAL Bilateral 2012  . CORONARY ANGIOPLASTY WITH STENT PLACEMENT  07/2009   bare metal stent to SVG to the RCA  . CORONARY ARTERY BYPASS GRAFT  1996   LIMA to LAD,SVG to RCA & SVG to OM  . INSERT / REPLACE / REMOVE PACEMAKER  2010  . MELANOMA EXCISION  05/1974 X2   "taken off my back" (12/15/2012)  . NM MYOVIEW LTD  06/2011   low risk  . TONSILLECTOMY  1938  . TRANSURETHRAL RESECTION OF PROSTATE  1986  . US ECHOCARDIOGRAPHY  07/11/2009   EF 45-50%   HPI:  [name] a 82 y.o.malewith medical history significant of congestive cardiomyopathy, combined systolic and diastolic heart failure, CAD, HTN, postural hypotension,intermittent weakness for which she's been followed by neurology with cerebral atrophy and cerebellar atrophy on CT scan, GERD and constipation, RBBB. He sometimes have intermittent weakness currently states the weakness has resolved. and has trouble holding fork this has been chronic and he has been seen by neurology for this. Per pt reprot, pt seeing neurology for memory impairments and received HHST. 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    Assessment / Plan / Recommendation Clinical Impression  Pt does not demonstrate any aphasia, dysarthria or cognitive deficits this day. Pt reports that he has received HHST ~1year ago for help with memory strategies. Pt feels that he is functional and no memory deficits noted within this day's evaluation. Education provided to pt to seek [place] if he feels that memory issues worsen.  Skilled ST is not needed during this hospitalization and both pt and nursing agree. ST to sign off at this time.     SLP Assessment  Patient does not need any further Speech Lanaguage Pathology Services    Follow Up Recommendations  None          SLP Evaluation Cognition  Overall Cognitive Status: Within Functional Limits for tasks assessed Arousal/Alertness: Awake/alert Orientation Level: Oriented X4 Attention: Selective Selective Attention: Appears intact Memory: Appears intact Awareness: Appears intact Problem Solving: Appears intact Safety/Judgment: Appears intact       Comprehension  Auditory Comprehension Overall Auditory Comprehension: Appears within functional limits for tasks assessed Yes/No Questions: Within Functional Limits Commands: Within Functional Limits Conversation: Simple Visual Recognition/Discrimination Discrimination: Not tested Reading Comprehension Reading Status: Not tested    Expression Expression Primary Mode of Expression: Verbal Verbal Expression Overall Verbal Expression: Appears within functional limits for tasks assessed Initiation: No impairment Level of Generative/Spontaneous Verbalization: Conversation Repetition: No impairment Naming: No impairment Pragmatics: No impairment Non-Verbal Means of Communication: Not applicable Written Expression Dominant Hand: Right Written Expression: Not tested   Oral / Motor  Oral Motor/Sensory Function Overall Oral Motor/Sensory Function: Within functional limits Motor Speech Overall Motor Speech: Appears within functional limits for tasks assessed Respiration: Within functional limits Phonation: Normal Resonance: Within functional limits Articulation: Within functional limitis Intelligibility: Intelligible Motor Planning: Witnin functional limits Motor Speech Errors: Not applicable   GO          [name] B. Rutherford Nail, M.S., CCC-SLP Speech-Language Pathologist          [name] 12/08/2015, 9:17 AM

## 2015-12-08 NOTE — Progress Notes (Signed)
Physical Therapy Treatment Patient Details Name: Adam [name] MRN: [id number] DOB: Apr 30, 1926 Today's Date: 12/08/2015    History of Present Illness 82 y.o.malewith medical history significant of congestive cardiomyopathy, combined systolic and diastolic heart failure, CAD, HTN, postural hypotension,intermittent weakness for which he's been followed by neurology with cerebral atrophy and cerebellar atrophy on CT scan, GERD and constipation, RBBB. Pt presenting with R LE weakness. CT on 11/1 negative for acute intracranial abnormality. CT lumbar spine with Moderate multilevel degenerative disc disease (L1-L5); MRA pending    PT Comments    Patient progressing well. Required near constant verbal and tactile cues for upright posture (which he can achieve) while walking. Family present and remain in agreement with plan  Follow Up Recommendations  Home health PT;Supervision for mobility/OOB (HH at [place] apartment)     Equipment Recommendations  None recommended by PT    Recommendations for Other Services       Precautions / Restrictions Precautions Precautions: Fall Restrictions Weight Bearing Restrictions: No    Mobility  Bed Mobility               General bed mobility comments: Not assessed (up in bathroom on arrival)  Transfers Overall transfer level: Needs assistance Equipment used: 4-wheeled walker Transfers: Sit to/from Stand Sit to Stand: Min guard         General transfer comment: x 5 total; pt will self-correct unsafe hand placement 90% of the time on his own (no vc)  Ambulation/Gait Ambulation/Gait assistance: Min assist Ambulation Distance (Feet): 160 Feet Assistive device: 4-wheeled walker Gait Pattern/deviations: Step-through pattern;Decreased stride length;Trunk flexed   Gait velocity interpretation: Below normal speed for age/gender General Gait Details: min assist and vc for upright posture (tends to flex forward with RW too far  ahead). Confirmed in static stance that handles are set at proper height for him and explained why it would be bad to raise them (daughter and wife questioned);    Stairs            Wheelchair Mobility    Modified Rankin (Stroke Patients Only) Modified Rankin (Stroke Patients Only) Pre-Morbid Rankin Score: Moderate disability Modified Rankin: Moderately severe disability     Balance Overall balance assessment: Needs assistance Sitting-balance support: No upper extremity supported;Feet supported Sitting balance-Leahy Scale: Fair     Standing balance support: No upper extremity supported Standing balance-Leahy Scale: Poor Standing balance comment: min assist for upright posture                     Cognition Arousal/Alertness: Awake/alert ("Tired" ) Behavior During Therapy: WFL for tasks assessed/performed Overall Cognitive Status: Within Functional Limits for tasks assessed                      Exercises General Exercises - Lower Extremity Ankle Circles/Pumps: AROM;Right;10 reps Long Arc Quad: AROM;Right;10 reps Other Exercises Other Exercises: sit to stand x 3 reps consecutively for strengthening; use of UEs to come to stand; hands on thighs to descend slowly    General Comments General comments (skin integrity, edema, etc.): wife and daughter present      Pertinent Vitals/Pain Pain Assessment: Faces Faces Pain Scale: Hurts little more Pain Location: lt shoulder Pain Descriptors / Indicators: Discomfort Pain Intervention(s): Limited activity within patient's tolerance;Monitored during session    Home Living                      Prior Function  PT Goals (current goals can now be found in the care plan section) Acute Rehab PT Goals Patient Stated Goal: return home Time For Goal Achievement: 12/14/15 Progress towards PT goals: Progressing toward goals    Frequency    Min 4X/week      PT Plan Current plan remains  appropriate    Co-evaluation             End of Session Equipment Utilized During Treatment: Gait belt Activity Tolerance: Patient tolerated treatment well Patient left: in chair;with call bell/phone within reach;with family/visitor present     Time: [id number] PT Time Calculation (min) (ACUTE ONLY): 34 min  Charges:                       G Codes:      Saralyn Willison December 14, 2015, 3:55 PM Pager [phone]

## 2015-12-11 ENCOUNTER — Telehealth: Payer: Self-pay | Admitting: Neurology

## 2015-12-11 ENCOUNTER — Other Ambulatory Visit: Payer: Self-pay | Admitting: Internal Medicine

## 2015-12-11 NOTE — Telephone Encounter (Signed)
Daughter Adah Salvage [phone] called to advise, pharmacy notified them that insurance is requiring PA for [facility].

## 2015-12-12 ENCOUNTER — Non-Acute Institutional Stay (INDEPENDENT_AMBULATORY_CARE_PROVIDER_SITE_OTHER): Payer: Medicare Other | Admitting: Internal Medicine

## 2015-12-12 ENCOUNTER — Encounter: Payer: Self-pay | Admitting: Internal Medicine

## 2015-12-12 VITALS — BP 104/64 | HR 88 | Temp 97.6°F | Ht 71.0 in | Wt 183.0 lb

## 2015-12-12 DIAGNOSIS — E785 Hyperlipidemia, unspecified: Secondary | ICD-10-CM | POA: Diagnosis not present

## 2015-12-12 DIAGNOSIS — R29898 Other symptoms and signs involving the musculoskeletal system: Secondary | ICD-10-CM | POA: Diagnosis not present

## 2015-12-12 DIAGNOSIS — I10 Essential (primary) hypertension: Secondary | ICD-10-CM | POA: Diagnosis not present

## 2015-12-12 DIAGNOSIS — E039 Hypothyroidism, unspecified: Secondary | ICD-10-CM

## 2015-12-12 DIAGNOSIS — R42 Dizziness and giddiness: Secondary | ICD-10-CM | POA: Diagnosis not present

## 2015-12-12 DIAGNOSIS — I255 Ischemic cardiomyopathy: Secondary | ICD-10-CM

## 2015-12-12 DIAGNOSIS — I6340 Cerebral infarction due to embolism of unspecified cerebral artery: Secondary | ICD-10-CM

## 2015-12-12 NOTE — Telephone Encounter (Signed)
Daughter Adah Salvage called to check status of PA for Eliquis. Please call [phone].

## 2015-12-12 NOTE — Telephone Encounter (Signed)
LM for [name]. I stated that Dr. Rexene Alberts does not prescribe his Eliquis, looks like last prescribed by a Dr. Cordelia Poche. I advised that the pharmacy would send prescribing doctor the PA request.

## 2015-12-12 NOTE — Progress Notes (Signed)
Facility  [facility]    Place of Service: Clinic (12)     Allergies  Allergen Reactions  . Crestor [Rosuvastatin Calcium]     Unknown reaction   . Altace [Ramipril] Other (See Comments)    Cough  . Bactrim [Sulfamethoxazole-Trimethoprim] Rash  . Penicillins Rash    Has patient had a PCN reaction causing immediate rash, facial/tongue/throat swelling, SOB or lightheadedness with hypotension: No Has patient had a PCN reaction causing severe rash involving mucus membranes or skin necrosis: Yes Has patient had a PCN reaction that required hospitalization Yes Has patient had a PCN reaction occurring within the last 10 years: No If all of the above answers are "NO", then may proceed with Cephalosporin use.   . Sulfa Antibiotics Rash    Chief Complaint  Patient presents with  . Hospitalization Follow-up    12/06/15 to 12/08/15 right leg weakness. Here with daughter Caryl Asp, and wife    HPI:  Last seen by me 10/03/15. Dizzy. Amiodarone was to be reduced, but he actually increased the dose to twice daily. Restless legs better on ropinerole. CHF and cardiomyopathy stable.  Hospitalized 12/06/15 through 12/08/15 for abrupt onset of weakness of the RLE. He has been getting some home therapy by [place]. He improved in strength in the right leg since his return home..  Patient says he was told that he had a stroke that caused his right leg weakness, Radiologic reports do not confirm this. He does have small vessel disease. MRI was not done because he has a pacemaker. Because of his AF, he was changed to Eliquis for his anticoagulation. He does have problems in the lumbar sine. This may be as likely an explanation for his leg weakness as the idea of a stroke.  Patient would like to continue with [facility] for any necessary PT, OT, and ST. He also has options of attending the chair exercise class and use of the NuStep exercise machine.  Medications: Patient's Medications  New Prescriptions   No  medications on file  Previous Medications   AMIODARONE (PACERONE) 200 MG TABLET    Take two tablets daily for heart rhythm   APIXABAN (ELIQUIS) 5 MG TABS TABLET    Take 1 tablet (5 mg total) by mouth 2 (two) times daily.   BISACODYL (DULCOLAX) 5 MG EC TABLET    Take 5 mg by mouth daily as needed for moderate constipation.   CHOLECALCIFEROL (VITAMIN D3) 5000 UNITS CAPS    Take 1 capsule by mouth every Monday. Reported on 02/22/2015   CLOBETASOL (TEMOVATE) 0.05 % EXTERNAL SOLUTION    Apply 1 application topically. Apply once or twice daily on scaple   CLONAZEPAM (KLONOPIN) 0.5 MG TABLET    Take 1 tablet (0.5 mg total) by mouth at bedtime. Take 1 tablet by mouth every 4 hours as needed for anxiety/restless legs   LEVOTHYROXINE (SYNTHROID, LEVOTHROID) 137 MCG TABLET    Take 137 mcg by mouth daily before breakfast.   LUBIPROSTONE (AMITIZA) 8 MCG CAPSULE    Take 8 mcg by mouth 2 (two) times daily with a meal.   MIDODRINE (PROAMATINE) 5 MG TABLET    Take 1 tablet (5 mg total) by mouth 3 (three) times daily with meals. Take third dose at least 6 hours before bedtime   PANTOPRAZOLE (PROTONIX) 40 MG TABLET    Take 1 at bedtime to reduce stomach acid   PRAVASTATIN (PRAVACHOL) 40 MG TABLET    Take 1 tablet (40 mg total) by mouth  daily. To lower cholesterol   ROPINIROLE (REQUIP) 0.5 MG TABLET    Take 1 tablet (0.5 mg total) by mouth 2 (two) times daily.   SENNA PO    Take by mouth. Take 2 tablets daily for constipation   TAMSULOSIN (FLOMAX) 0.4 MG CAPS CAPSULE    TAKE ONE CAPSULE DAILY TO HELP IMPROVE URINARY FREQUENCY.   TORSEMIDE (DEMADEX) 20 MG TABLET    Take 1 tablets daily if you gain 3 or more pounds weight. Stop when weight returns to normal..   TRAZODONE (DESYREL) 50 MG TABLET    TAKE 1 TABLET AT BEDTIME AS NEEDED.  Modified Medications   No medications on file  Discontinued Medications   No medications on file     Review of Systems  Constitutional: Positive for fatigue.  HENT: Positive for  hearing loss and tinnitus.   Eyes: Negative for photophobia and pain.  Respiratory: Positive for shortness of breath. Negative for cough.   Cardiovascular: Positive for leg swelling (2+ and usingcompression hosiery). Negative for chest pain.       Congestive cardiomyopathy. Coronary artery disease, status post CABG. Cardiac arrhythmias, status post pacemaker implanted left upper chest.  Gastrointestinal: Positive for constipation. Negative for abdominal pain, diarrhea, nausea and vomiting.  Genitourinary: Positive for enuresis and frequency. Negative for hematuria.       Double voids. Urgency. Frequency. Nocturia x 7.  Musculoskeletal: Positive for gait problem (using rolloing walker). Negative for back pain, myalgias and neck pain.       Ambulates with walker.   Skin: Negative for rash.       Chronic medial right buttock pressure ulcer.  Neurological: Positive for dizziness, speech difficulty (sometimes has difficulty with word finding) and weakness. Negative for tremors.       MRI brain disclosed cerebral atrophy and microvascular disease.  Insomnia and restless legs are improved. Patient reports that he has H/O dizziness. This makes the patient feel unbalanced. R hand numbness.  Cognitive decline since 2014.  Psychiatric/Behavioral: Positive for sleep disturbance (nocturia x 7). Negative for hallucinations and suicidal ideas. The patient is not nervous/anxious.        Not enough sleep, decreased energy     There were no vitals filed for this visit. Wt Readings from Last 3 Encounters:  12/06/15 189 lb 1.6 oz (85.8 kg)  10/30/15 189 lb (85.7 kg)  10/03/15 188 lb (85.3 kg)    There is no height or weight on file to calculate BMI.  Physical Exam  Constitutional: He is oriented to person, place, and time.  Frail.  HENT:  Head: Normocephalic and atraumatic.  Right Ear: External ear normal.  Left Ear: External ear normal.  Eyes: Conjunctivae and EOM are normal. Pupils are equal,  round, and reactive to light. Right eye exhibits no discharge. Left eye exhibits no discharge.  Neck: Normal range of motion. Neck supple. No JVD present. No tracheal deviation present. No thyromegaly present.  Cardiovascular:  Murmur heard. 4-1/0 systolic  Pulmonary/Chest: Effort normal and breath sounds normal. No respiratory distress. He has no wheezes. He has no rales.  Abdominal: He exhibits no distension. There is no tenderness.  abd hernia  Genitourinary: No penile tenderness.  Musculoskeletal: Normal range of motion. He exhibits edema (2+ bipedal). He exhibits no tenderness.  Gait disturbance and loss of balance. Patient arrives in a  wheelchair today.  Neurological: He is alert and oriented to person, place, and time. He has normal reflexes. No cranial nerve deficit. He exhibits normal  muscle tone. Coordination normal.  Skin: Skin is warm and dry. No rash noted. No erythema.  Pressure ulcer right medial buttock.  Psychiatric: He has a normal mood and affect. His behavior is normal. Judgment and thought content normal.     Labs reviewed: Lab Summary Latest Ref Rng & Units 12/07/2015 12/06/2015 11/27/2015  Hemoglobin 13.0 - 17.0 g/dL (None) 13.5 14.8  Hematocrit 39.0 - 52.0 % (None) 41.4 44  White count 4.0 - 10.5 K/uL (None) 5.4 6.5  Platelet count 150 - 400 K/uL (None) 183 212  Sodium 135 - 145 mmol/L (None) 139 141  Potassium 3.5 - 5.1 mmol/L (None) 4.0 4.3  Calcium 8.9 - 10.3 mg/dL (None) 9.0 (None)  Phosphorus - (None) (None) (None)  Creatinine 0.61 - 1.24 mg/dL (None) 0.85 0.9  AST 15 - 41 U/L (None) 27 23  Alk Phos 38 - 126 U/L (None) 60 65  Bilirubin 0.3 - 1.2 mg/dL (None) 0.5 (None)  Glucose 65 - 99 mg/dL (None) 114(H) 89  Cholesterol 0 - 200 mg/dL 84 (None) 97  HDL cholesterol >40 mg/dL 41 (None) 53  Triglycerides <150 mg/dL 102 (None) 97  LDL Direct - (None) (None) (None)  LDL Calc 0 - 99 mg/dL 23 (None) 25  Total protein 6.5 - 8.1 g/dL (None) 6.2(L) (None)    Albumin 3.5 - 5.0 g/dL (None) 3.4(L) (None)  Some recent data might be hidden   Lab Results  Component Value Date   TSH 2.58 11/27/2015   Lab Results  Component Value Date   BUN 12 12/06/2015   BUN 13 11/27/2015   BUN 18 08/09/2015   Lab Results  Component Value Date   CREATININE 0.85 12/06/2015   CREATININE 0.9 11/27/2015   CREATININE 7.0 (A) 08/09/2015   Lab Results  Component Value Date   HGBA1C 5.6 12/07/2015   HGBA1C 5.9 04/20/2015   HGBA1C 5.8 02/16/2015    Ct Head Wo Contrast  Result Date: 12/06/2015 CLINICAL DATA:  Onset of right leg weakness, unable to walk, dizziness EXAM: CT HEAD WITHOUT CONTRAST TECHNIQUE: Contiguous axial images were obtained from the base of the skull through the vertex without intravenous contrast. COMPARISON:  12/30/2014 FINDINGS: Brain: No acute territorial infarction, intracranial hemorrhage or extra-axial fluid collection. Mild to moderate global atrophy. Minimal periventricular white matter hypodensities consistent with small vessel disease. Mild bilateral basal ganglial calcification. No mass or midline shift. Ventricles similar in size and morphology. Vascular: No hyperdense vessels. Calcifications within the carotid arteries at the skullbase. Skull: Normal. Negative for fracture or focal lesion. Sinuses/Orbits: Mucosal thickening in the ethmoids and left maxillary sinus. No acute orbital abnormality. Other: None  IMPRESSION: No CT evidence for acute intracranial abnormality. Electronically Signed   By: Donavan Foil M.D.   On: 12/06/2015 18:04   Ct Lumbar Spine Wo Contrast  Result Date: 12/06/2015 CLINICAL DATA:  Right leg weakness pain in the low back EXAM: CT LUMBAR SPINE WITHOUT CONTRAST TECHNIQUE: Multidetector CT imaging of the lumbar spine was performed without intravenous contrast administration. Multiplanar CT image reconstructions were also generated. COMPARISON:  12/29/2014 FINDINGS: Segmentation: Transitional anatomy is present.  The first non rib-bearing vertebra will be designated L1. Transitional vertebra will be labeled L6/transitional vertebra. Alignment: Mild retrolisthesis of L2 on L3 and L3 on L4. Vertebrae: Minimal chronic appearing wedge deformities of L1 and L2. No acute fracture. Bilateral hyper trophic facet disease from L4 through the upper sacrum. Suspected disc bulges at L3-L4, L4-L5, L5-L6. No fatty effacement of the fat planes  around the exiting nerve roots. Paraspinal and other soft tissues: Fatty atrophy of the paraspinal muscles. Atherosclerotic vascular disease of the aorta. Partially visualized bilateral renal cyst, on the right, the largest measures 2.9 cm, on the left this measures up to 5.1 cm. Disc levels: Moderate degenerative changes at L1-L2, L2-L3, L3-L4 with endplate changes and vacuum disc.  IMPRESSION: 1. Suspected transitional anatomy of the lumbosacral spine. No acute fracture. Mild retrolisthesis of L2 on L3 and L3 on L4. 2. Moderate multilevel degenerative disc disease. 3. Bilateral renal cysts. Electronically Signed   By: Donavan Foil M.D.   On: 12/06/2015 20:32   12/08/15 CTA head and neck: IMPRESSION: 1. No significant stenosis, proximal occlusion, or vascular malformation of the circle of Willis. 2. 2 mm superiorly directed outpouching of bilateral A2 junction/A-comm may represent tiny aneurysm or infundibular origin of diminutive vessel. 3. No acute intracranial abnormality is identified. If symptoms persist or if clinically indicated MRI of the brain is more sensitive for acute stroke. 4. Stable mild chronic microvascular ischemic changes and parenchymal volume loss for age.    Assessment/Plan  1. Cerebral infarction due to embolism of cerebral artery (HCC) Suspected. Residual right leg weakness. Initially ad a n expressive aphasia. This seems much improved.  2. Right leg weakness Suspected etiology is embolic CVA. Gaining strength. Continue with PT and OT.  3.  Cardiomyopathy, ischemic Chronic and stable  4. Dizziness, after diuretic asscoiated with hypotension and responded to fluid bolus improved  5. Essential hypertension controlled  6. Hypothyroidism, unspecified type compensated  7. Hyperlipidemia, unspecified hyperlipidemia type controlled

## 2015-12-13 ENCOUNTER — Telehealth: Payer: Self-pay

## 2015-12-13 ENCOUNTER — Encounter: Payer: Self-pay | Admitting: Internal Medicine

## 2015-12-13 NOTE — Telephone Encounter (Signed)
Form never received, I called [phone] to request again. Awaiting fax

## 2015-12-13 NOTE — Telephone Encounter (Signed)
Spoke with daughter Caryl Asp, and wife that I called for the prior authorization with insurance. I have samples of Eliquis 5 mg #28, that they can pick up at the office. They both said they would pick it up.

## 2015-12-14 NOTE — Telephone Encounter (Signed)
Received fax from [place] [phone] and Eliquis 5mg  was APPROVED through 02/03/2017 Patient [id number]

## 2015-12-18 ENCOUNTER — Other Ambulatory Visit: Payer: Self-pay | Admitting: Internal Medicine

## 2015-12-21 ENCOUNTER — Telehealth: Payer: Self-pay | Admitting: Cardiovascular Disease

## 2015-12-21 NOTE — Telephone Encounter (Signed)
Mr. Puentes is wanting you (Nathan)to call him concerning his heart situation. Please call   Thanks

## 2015-12-21 NOTE — Telephone Encounter (Signed)
Thank you :)

## 2015-12-21 NOTE — Telephone Encounter (Signed)
Spoke to this pleasant gentleman. He notes I have helped with his care in the past and spoken with his daughter on a few occasions.  Patient notes he has been experiencing more fatigue, and has been intermittently short of breath. Unfortunately, he did not have any VS readings for me.  He and his wife are currently residing at [facility] in an apartment. He is in his wheelchair most of the time, so reports he is unable to tell whether his shortness of breath is more severe with exertion. He does note that at times he gets short of breath after speaking short sentences. He doesn't identify this symptom today. States the SOB is variable from day to day. Does not identify any fluid gain, other acute changes. Of note, however, he informed me he had a "minor" stroke approx 1 week ago. Feels somewhat more deconditioned since then.  He would like to see PA if possible. He understands no visits available today due to lateness of the day, but I have scheduled him to see Lurena Joiner at [facility]. He states he will be at appt. We have discussed plan and I have advised that if his SOB is worse in the interim, he should go to the ER. He voiced agreement.  Routed to [facility] and Dr. Sallyanne Kuster as Juluis Rainier.

## 2015-12-22 ENCOUNTER — Telehealth: Payer: Self-pay | Admitting: Cardiology

## 2015-12-22 ENCOUNTER — Ambulatory Visit (INDEPENDENT_AMBULATORY_CARE_PROVIDER_SITE_OTHER): Payer: Medicare Other | Admitting: Cardiology

## 2015-12-22 ENCOUNTER — Encounter: Payer: Self-pay | Admitting: Cardiology

## 2015-12-22 DIAGNOSIS — I252 Old myocardial infarction: Secondary | ICD-10-CM

## 2015-12-22 DIAGNOSIS — R4701 Aphasia: Secondary | ICD-10-CM

## 2015-12-22 DIAGNOSIS — I951 Orthostatic hypotension: Secondary | ICD-10-CM

## 2015-12-22 DIAGNOSIS — Z950 Presence of cardiac pacemaker: Secondary | ICD-10-CM

## 2015-12-22 DIAGNOSIS — I255 Ischemic cardiomyopathy: Secondary | ICD-10-CM | POA: Diagnosis not present

## 2015-12-22 DIAGNOSIS — I63139 Cerebral infarction due to embolism of unspecified carotid artery: Secondary | ICD-10-CM | POA: Diagnosis not present

## 2015-12-22 DIAGNOSIS — I472 Ventricular tachycardia, unspecified: Secondary | ICD-10-CM

## 2015-12-22 DIAGNOSIS — I2510 Atherosclerotic heart disease of native coronary artery without angina pectoris: Secondary | ICD-10-CM

## 2015-12-22 DIAGNOSIS — Z7901 Long term (current) use of anticoagulants: Secondary | ICD-10-CM

## 2015-12-22 NOTE — Progress Notes (Signed)
Thank you :)

## 2015-12-22 NOTE — Telephone Encounter (Signed)
LMOVM requesting that pt call me back.

## 2015-12-22 NOTE — Progress Notes (Signed)
12/22/2015 Adam [name]   1926/09/06  [id number]  Primary Physician Jeanmarie Hubert, MD Primary Cardiologist: Dr Sallyanne Kuster  HPI:  82 y/o complicated male followed by Dr Sallyanne Kuster and Dr Nyoka Cowden with a history of CAD, ICM, orhtostatic hypotension, SSS-s/p PTVDP 2010, mild dementia, and VT on Amiodarone. Dr Nyoka Cowden has recently decreased his Amiodarone secondary to "weak spells".  Decreasing his Amiodarone had been discussed with Dr Sallyanne Kuster in the past and he was not opposed to this. The pt was admitted to [facility] 11/1-11/3/17 with Rt leg weakness. CT was negative, MRI unable to be done secondary to pacemaker. There was some concern it may be secondary to L spine DJD as well. Neurology recommended Eliquis for presumed Lt brain stoke. He is in the office today with his family for follow up. He seems to making improvement in his Rt leg weakness with PT. They were concerned he had CHF secondary to complaints of SOB and weakness every 3-4 days.    Current Outpatient Prescriptions  Medication Sig Dispense Refill  . amiodarone (PACERONE) 200 MG tablet Take 200 mg by mouth daily.    Marland Kitchen apixaban (ELIQUIS) 5 MG TABS tablet Take 1 tablet (5 mg total) by mouth 2 (two) times daily. 60 tablet 0  . bisacodyl (DULCOLAX) 5 MG EC tablet Take 5 mg by mouth daily as needed for moderate constipation.    . Cholecalciferol (VITAMIN D3) 5000 UNITS CAPS Take 1 capsule by mouth every Monday. Reported on 02/22/2015    . clobetasol (TEMOVATE) 0.05 % external solution Apply 1 application topically. Apply once or twice daily on scaple    . clonazePAM (KLONOPIN) 0.5 MG tablet TAKE ONE TABLET EVERY 4 HOURS AS NEEDED FOR ANXIETY/RESTLESS LEGS OR AT BEDTIME. 120 tablet 0  . levothyroxine (SYNTHROID, LEVOTHROID) 137 MCG tablet Take 137 mcg by mouth daily before breakfast.    . lubiprostone (AMITIZA) 8 MCG capsule Take 24 mcg by mouth daily with breakfast.     . midodrine (PROAMATINE) 5 MG tablet Take 1 tablet (5 mg total) by mouth 3  (three) times daily with meals. Take third dose at least 6 hours before bedtime 90 tablet 5  . pantoprazole (PROTONIX) 40 MG tablet Take 1 at bedtime to reduce stomach acid 60 tablet 11  . pravastatin (PRAVACHOL) 40 MG tablet Take 1 tablet (40 mg total) by mouth daily. To lower cholesterol 90 tablet 1  . rOPINIRole (REQUIP) 0.5 MG tablet Take 1 tablet (0.5 mg total) by mouth 2 (two) times daily. 60 tablet 0  . SENNA PO Take by mouth. Take 2 tablets daily for constipation    . tamsulosin (FLOMAX) 0.4 MG CAPS capsule TAKE ONE CAPSULE DAILY TO HELP IMPROVE URINARY FREQUENCY. 30 capsule 0  . torsemide (DEMADEX) 20 MG tablet Take 1 tablets daily if you gain 3 or more pounds weight. Stop when weight returns to normal.. 30 tablet 5  . traZODone (DESYREL) 50 MG tablet TAKE 1 TABLET AT BEDTIME AS NEEDED. 30 tablet 0   No current facility-administered medications for this visit.     Allergies  Allergen Reactions  . Crestor [Rosuvastatin Calcium]     Unknown reaction   . Altace [Ramipril] Other (See Comments)    Cough  . Bactrim [Sulfamethoxazole-Trimethoprim] Rash  . Penicillins Rash    Has patient had a PCN reaction causing immediate rash, facial/tongue/throat swelling, SOB or lightheadedness with hypotension: No Has patient had a PCN reaction causing severe rash involving mucus membranes or skin necrosis: Yes Has  patient had a PCN reaction that required hospitalization Yes Has patient had a PCN reaction occurring within the last 10 years: No If all of the above answers are "NO", then may proceed with Cephalosporin use.   . Sulfa Antibiotics Rash    Social History   Social History  . Marital status: Married    Spouse name: N/A  . Number of children: 2  . Years of education: Masters   Occupational History  . Retired Company secretary -Pensions consultant    Social History Main Topics  . Smoking status: Never Smoker  . Smokeless tobacco: Never Used  . Alcohol use No  . Drug use: No  .  Sexual activity: No   Other Topics Concern  . Not on file   Social History Narrative   Lives at [place] to [place] 01/09/15   Married - Violet   Never smoked   Alcohol none   Previously employed as Scientist, forensic for [employer].         Diet:Low sodium   Do you drink/eat things with caffeine? No   Marital status: Married                              What year were you married?1950   Do you live in a house, apartment, assisted living, condo, trailer, etc)?    Is it one or more stories? 1   How many persons live in your home? 2   Do you have any pets in your home? No   Current or past profession: Minister, [employer]   Do you exercise?     Very Little                                                 Type & how often:    Do you have a living will?  Yes   Do you have a DNR Form? Yes   Do you have a POA/HPOA forms? Yes     Review of Systems: General: negative for chills, fever, night sweats or weight changes.  Cardiovascular: negative for chest pain, dyspnea on exertion, edema, orthopnea, palpitations, paroxysmal nocturnal dyspnea or shortness of breath Dermatological: negative for rash Respiratory: negative for cough or wheezing Urologic: negative for hematuria Abdominal: negative for nausea, vomiting, diarrhea, bright red blood per rectum, melena, or hematemesis Neurologic: negative for visual changes, syncope, or dizziness All other systems reviewed and are otherwise negative except as noted above.    Blood pressure (!) 118/58, pulse 70, height 5\' 10"  (1.778 m), weight 183 lb 9.6 oz (83.3 kg), SpO2 97 %.  General appearance: alert, cooperative and no distress Neck: no carotid bruit and no JVD Lungs: clear to auscultation bilaterally Heart: regular rate and rhythm Extremities: 2+ chronic LE edema Skin: Skin color, texture, turgor normal. No rashes or lesions Neurologic: Grossly normal, mild word finding difficulty  EKG A paced  ASSESSMENT AND  PLAN:   Embolic cerebral infarction (HCC) Pt suspected to have a Lt brain stroke 12/06/15 with Rt leg weakness  Cardiomyopathy, ischemic Last EF 30-44% by Myoview March 2017  Orthostatic hypotension Seems stable today  Coronary artery disease with hx of myocardial infarct w/o hx of CABG CABG (LIMA-LAD, SVG-RCA, SVG-OM in 1996).  07/2009 BMS to SVG-RCA. Cath in 04/2010 with patent stents,  Myoview march 2017- scar  Expressive aphasia This may be slightly exacerbated since his stroke  Pacemaker [employer] Accent DR RF 2210 dual-chamber permanent pacemaker implanted March 2010 for sinus node dysfunction.  VT (ventricular tachycardia) (HCC) On Amiodarone, now on 200 mg daily  Chronic anticoagulation Eliquis started Nov 2017 by Neurology after suspected Lt brain embolic stroke   PLAN  I had a long discussion with the pt and family. I reassured them he was not in CHF on exam. I suggested they give the lower dose of Amiodarone a little more time to see if it helps his weak spells. They understand the possible down side if recurrent VT.   Kerin Ransom PA-C 12/22/2015 9:08 AM

## 2015-12-22 NOTE — Assessment & Plan Note (Signed)
Last EF 30-44% by [facility] March 2017

## 2015-12-22 NOTE — Assessment & Plan Note (Signed)
On Amiodarone

## 2015-12-22 NOTE — Telephone Encounter (Signed)
-----   Message from [name], MD sent at 12/22/2015 11:58 AM EST -----   ----- Message ----- From: Erlene Quan, PA-C Sent: 12/22/2015   9:22 AM To: [name], MD

## 2015-12-22 NOTE — Assessment & Plan Note (Signed)
Seems stable today  

## 2015-12-22 NOTE — Assessment & Plan Note (Signed)
St. Jude Accent DR RF 2210 dual-chamber permanent pacemaker implanted March 2010 for sinus node dysfunction.

## 2015-12-22 NOTE — Patient Instructions (Addendum)
Medication Instructions:  Continue current medications  Labwork: None Ordered  Testing/Procedures: None Ordered  Follow-Up: Your physician recommends that you schedule a follow-up appointment in: January 2018 with Dr Sallyanne Kuster   Any Other Special Instructions Will Be Listed Below (If Applicable).        Happy Thanksgiving   If you need a refill on your cardiac medications before your next appointment, please call your pharmacy.

## 2015-12-22 NOTE — Assessment & Plan Note (Signed)
Eliquis started Nov 2017 by Neurology after suspected Lt brain embolic stroke

## 2015-12-22 NOTE — Assessment & Plan Note (Signed)
Pt suspected to have a Lt brain stroke 12/06/15 with Rt leg weakness

## 2015-12-22 NOTE — Progress Notes (Signed)
Thanks. Has anyone interrogated his pacemaker to see if he did indeed have atrial fibrillation, before we started the Eliquis? Device has not been checked in a year, not sure why, will ask device clinic to check MCr

## 2015-12-22 NOTE — Assessment & Plan Note (Signed)
This may be slightly exacerbated since his stroke

## 2015-12-22 NOTE — Assessment & Plan Note (Signed)
CABG (LIMA-LAD, SVG-RCA, SVG-OM in 1996).  07/2009 BMS to SVG-RCA. Cath in 04/2010 with patent stents, Myoview march 2017- scar

## 2015-12-22 NOTE — Telephone Encounter (Signed)
Spoke w/ pt and he informed me that he moved to [facility] and that he had not set up his monitor since the move. Pt is not sure if he has all of the pieces to hook his monitor up to his land line phone. I call Gaspar Bidding w/ [facility] and he said he would call the patient and schedule a time to go the facility and help the patient set up his monitor. Pt aware and verbalized understanding.

## 2015-12-22 NOTE — Addendum Note (Signed)
Addended by: Diana Eves on: 12/22/2015 10:35 AM   Modules accepted: Orders

## 2016-01-05 ENCOUNTER — Other Ambulatory Visit: Payer: Self-pay | Admitting: *Deleted

## 2016-01-05 DIAGNOSIS — I5040 Unspecified combined systolic (congestive) and diastolic (congestive) heart failure: Secondary | ICD-10-CM

## 2016-01-05 MED ORDER — MIDODRINE HCL 5 MG PO TABS: 5.0000 mg | ORAL_TABLET | Freq: Three times a day (TID) | ORAL | 3 refills | Status: DC

## 2016-01-05 MED ORDER — PANTOPRAZOLE SODIUM 40 MG PO TBEC: DELAYED_RELEASE_TABLET | ORAL | 3 refills | Status: DC

## 2016-01-05 MED ORDER — APIXABAN 5 MG PO TABS: 5.0000 mg | ORAL_TABLET | Freq: Two times a day (BID) | ORAL | 3 refills | Status: DC

## 2016-01-05 MED ORDER — LEVOTHYROXINE SODIUM 137 MCG PO TABS: 137.0000 ug | ORAL_TABLET | Freq: Every day | ORAL | 3 refills | Status: DC

## 2016-01-05 MED ORDER — TAMSULOSIN HCL 0.4 MG PO CAPS: ORAL_CAPSULE | ORAL | 3 refills | Status: DC

## 2016-01-05 MED ORDER — TRAZODONE HCL 50 MG PO TABS: ORAL_TABLET | ORAL | 3 refills | Status: DC

## 2016-01-05 MED ORDER — TORSEMIDE 20 MG PO TABS: ORAL_TABLET | ORAL | 11 refills | Status: DC

## 2016-01-05 MED ORDER — CLONAZEPAM 0.5 MG PO TABS: ORAL_TABLET | ORAL | 3 refills | Status: DC

## 2016-01-05 MED ORDER — ROPINIROLE HCL 0.5 MG PO TABS: 0.5000 mg | ORAL_TABLET | Freq: Two times a day (BID) | ORAL | 3 refills | Status: DC

## 2016-01-05 NOTE — Telephone Encounter (Signed)
[facility] , a pharmacy that packages medications by the dose and ships them in advance to patient.  [phone] Fax: [phone] [email or web address]

## 2016-01-10 ENCOUNTER — Emergency Department (HOSPITAL_COMMUNITY): Payer: Medicare Other

## 2016-01-10 ENCOUNTER — Other Ambulatory Visit: Payer: Self-pay | Admitting: Internal Medicine

## 2016-01-10 ENCOUNTER — Observation Stay (HOSPITAL_COMMUNITY)
Admission: EM | Admit: 2016-01-10 | Discharge: 2016-01-12 | Disposition: A | Discharge status: Home / Self Care | Payer: Medicare Other | Attending: Internal Medicine | Admitting: Internal Medicine

## 2016-01-10 ENCOUNTER — Encounter (HOSPITAL_COMMUNITY): Payer: Self-pay | Admitting: Emergency Medicine

## 2016-01-10 DIAGNOSIS — I472 Ventricular tachycardia: Secondary | ICD-10-CM | POA: Diagnosis not present

## 2016-01-10 DIAGNOSIS — I959 Hypotension, unspecified: Secondary | ICD-10-CM

## 2016-01-10 DIAGNOSIS — R42 Dizziness and giddiness: Secondary | ICD-10-CM | POA: Diagnosis not present

## 2016-01-10 DIAGNOSIS — I2510 Atherosclerotic heart disease of native coronary artery without angina pectoris: Secondary | ICD-10-CM | POA: Insufficient documentation

## 2016-01-10 DIAGNOSIS — K219 Gastro-esophageal reflux disease without esophagitis: Secondary | ICD-10-CM | POA: Diagnosis not present

## 2016-01-10 DIAGNOSIS — I4510 Unspecified right bundle-branch block: Secondary | ICD-10-CM | POA: Diagnosis not present

## 2016-01-10 DIAGNOSIS — I5042 Chronic combined systolic (congestive) and diastolic (congestive) heart failure: Secondary | ICD-10-CM | POA: Diagnosis not present

## 2016-01-10 DIAGNOSIS — R0789 Other chest pain: Secondary | ICD-10-CM

## 2016-01-10 DIAGNOSIS — Z7901 Long term (current) use of anticoagulants: Secondary | ICD-10-CM | POA: Insufficient documentation

## 2016-01-10 DIAGNOSIS — Z950 Presence of cardiac pacemaker: Secondary | ICD-10-CM | POA: Diagnosis not present

## 2016-01-10 DIAGNOSIS — I110 Hypertensive heart disease with heart failure: Secondary | ICD-10-CM | POA: Diagnosis not present

## 2016-01-10 DIAGNOSIS — M6281 Muscle weakness (generalized): Secondary | ICD-10-CM

## 2016-01-10 DIAGNOSIS — R531 Weakness: Secondary | ICD-10-CM | POA: Insufficient documentation

## 2016-01-10 DIAGNOSIS — G2581 Restless legs syndrome: Secondary | ICD-10-CM | POA: Diagnosis not present

## 2016-01-10 DIAGNOSIS — I951 Orthostatic hypotension: Secondary | ICD-10-CM | POA: Insufficient documentation

## 2016-01-10 DIAGNOSIS — Z79899 Other long term (current) drug therapy: Secondary | ICD-10-CM | POA: Insufficient documentation

## 2016-01-10 DIAGNOSIS — R339 Retention of urine, unspecified: Secondary | ICD-10-CM | POA: Diagnosis present

## 2016-01-10 DIAGNOSIS — I252 Old myocardial infarction: Secondary | ICD-10-CM | POA: Insufficient documentation

## 2016-01-10 DIAGNOSIS — I739 Peripheral vascular disease, unspecified: Secondary | ICD-10-CM | POA: Insufficient documentation

## 2016-01-10 DIAGNOSIS — E039 Hypothyroidism, unspecified: Secondary | ICD-10-CM | POA: Diagnosis not present

## 2016-01-10 DIAGNOSIS — I255 Ischemic cardiomyopathy: Secondary | ICD-10-CM | POA: Insufficient documentation

## 2016-01-10 DIAGNOSIS — F0390 Unspecified dementia without behavioral disturbance: Secondary | ICD-10-CM | POA: Insufficient documentation

## 2016-01-10 DIAGNOSIS — F419 Anxiety disorder, unspecified: Secondary | ICD-10-CM | POA: Diagnosis not present

## 2016-01-10 DIAGNOSIS — I9589 Other hypotension: Secondary | ICD-10-CM | POA: Diagnosis not present

## 2016-01-10 DIAGNOSIS — I639 Cerebral infarction, unspecified: Secondary | ICD-10-CM

## 2016-01-10 DIAGNOSIS — Z955 Presence of coronary angioplasty implant and graft: Secondary | ICD-10-CM | POA: Diagnosis not present

## 2016-01-10 DIAGNOSIS — N400 Enlarged prostate without lower urinary tract symptoms: Secondary | ICD-10-CM | POA: Insufficient documentation

## 2016-01-10 DIAGNOSIS — E785 Hyperlipidemia, unspecified: Secondary | ICD-10-CM | POA: Diagnosis not present

## 2016-01-10 DIAGNOSIS — G459 Transient cerebral ischemic attack, unspecified: Secondary | ICD-10-CM

## 2016-01-10 DIAGNOSIS — R2990 Unspecified symptoms and signs involving the nervous system: Secondary | ICD-10-CM

## 2016-01-10 DIAGNOSIS — I482 Chronic atrial fibrillation, unspecified: Secondary | ICD-10-CM

## 2016-01-10 DIAGNOSIS — I495 Sick sinus syndrome: Secondary | ICD-10-CM | POA: Insufficient documentation

## 2016-01-10 DIAGNOSIS — G4700 Insomnia, unspecified: Secondary | ICD-10-CM | POA: Diagnosis not present

## 2016-01-10 DIAGNOSIS — R079 Chest pain, unspecified: Secondary | ICD-10-CM | POA: Diagnosis not present

## 2016-01-10 DIAGNOSIS — I350 Nonrheumatic aortic (valve) stenosis: Secondary | ICD-10-CM

## 2016-01-10 DIAGNOSIS — Z951 Presence of aortocoronary bypass graft: Secondary | ICD-10-CM

## 2016-01-10 LAB — I-STAT TROPONIN, ED: Troponin i, poc: 0.01 ng/mL (ref 0.00–0.08)

## 2016-01-10 LAB — CBC
HCT: 42.7 % (ref 39.0–52.0)
Hemoglobin: 14.1 g/dL (ref 13.0–17.0)
MCH: 32.3 pg (ref 26.0–34.0)
MCHC: 33 g/dL (ref 30.0–36.0)
MCV: 97.7 fL (ref 78.0–100.0)
Platelets: 190 10*3/uL (ref 150–400)
RBC: 4.37 MIL/uL (ref 4.22–5.81)
RDW: 14.4 % (ref 11.5–15.5)
WBC: 6 10*3/uL (ref 4.0–10.5)

## 2016-01-10 LAB — URINALYSIS, ROUTINE W REFLEX MICROSCOPIC
Bilirubin Urine: NEGATIVE
Glucose, UA: NEGATIVE mg/dL
Hgb urine dipstick: NEGATIVE
Ketones, ur: NEGATIVE mg/dL
Leukocytes, UA: NEGATIVE
Nitrite: NEGATIVE
Protein, ur: NEGATIVE mg/dL
Specific Gravity, Urine: 1.01 (ref 1.005–1.030)
pH: 6 (ref 5.0–8.0)

## 2016-01-10 LAB — RAPID URINE DRUG SCREEN, HOSP PERFORMED
Amphetamines: NOT DETECTED
Barbiturates: NOT DETECTED
Benzodiazepines: NOT DETECTED
Cocaine: NOT DETECTED
Opiates: NOT DETECTED
Tetrahydrocannabinol: NOT DETECTED

## 2016-01-10 LAB — COMPREHENSIVE METABOLIC PANEL
ALT: 22 U/L (ref 17–63)
AST: 25 U/L (ref 15–41)
Albumin: 3.5 g/dL (ref 3.5–5.0)
Alkaline Phosphatase: 68 U/L (ref 38–126)
Anion gap: 8 (ref 5–15)
BUN: 14 mg/dL (ref 6–20)
CO2: 26 mmol/L (ref 22–32)
Calcium: 9 mg/dL (ref 8.9–10.3)
Chloride: 106 mmol/L (ref 101–111)
Creatinine, Ser: 0.84 mg/dL (ref 0.61–1.24)
GFR calc Af Amer: >60 mL/min (ref >60)
GFR calc non Af Amer: >60 mL/min (ref >60)
Glucose, Bld: 106 mg/dL — ABNORMAL HIGH (ref 65–99)
Potassium: 4 mmol/L (ref 3.5–5.1)
Sodium: 140 mmol/L (ref 135–145)
Total Bilirubin: 0.4 mg/dL (ref 0.3–1.2)
Total Protein: 6.4 g/dL — ABNORMAL LOW (ref 6.5–8.1)

## 2016-01-10 LAB — TROPONIN I: Troponin I: <0.03 ng/mL (ref <0.03)

## 2016-01-10 LAB — I-STAT CHEM 8, ED
BUN: 15 mg/dL (ref 6–20)
Calcium, Ion: 1.14 mmol/L — ABNORMAL LOW (ref 1.15–1.40)
Chloride: 102 mmol/L (ref 101–111)
Creatinine, Ser: 0.8 mg/dL (ref 0.61–1.24)
Glucose, Bld: 99 mg/dL (ref 65–99)
HCT: 42 % (ref 39.0–52.0)
Hemoglobin: 14.3 g/dL (ref 13.0–17.0)
Potassium: 3.9 mmol/L (ref 3.5–5.1)
Sodium: 142 mmol/L (ref 135–145)
TCO2: 27 mmol/L (ref 0–100)

## 2016-01-10 LAB — DIFFERENTIAL
Basophils Absolute: 0 10*3/uL (ref 0.0–0.1)
Basophils Relative: 1 %
Eosinophils Absolute: 0.1 10*3/uL (ref 0.0–0.7)
Eosinophils Relative: 1 %
Lymphocytes Relative: 22 %
Lymphs Abs: 1.3 10*3/uL (ref 0.7–4.0)
Monocytes Absolute: 0.5 10*3/uL (ref 0.1–1.0)
Monocytes Relative: 9 %
Neutro Abs: 4.1 10*3/uL (ref 1.7–7.7)
Neutrophils Relative %: 67 %

## 2016-01-10 LAB — APTT: aPTT: 36 seconds (ref 24–36)

## 2016-01-10 LAB — PROTIME-INR
INR: 1.14
Prothrombin Time: 14.7 seconds (ref 11.4–15.2)

## 2016-01-10 MED ORDER — MIDODRINE HCL 5 MG PO TABS
5.0000 mg | ORAL_TABLET | Freq: Three times a day (TID) | ORAL | Status: DC
  Administered 2016-01-11 – 2016-01-12 (×5): 5 mg via ORAL
  Filled 2016-01-10 (×5): qty 1

## 2016-01-10 MED ORDER — MORPHINE SULFATE (PF) 2 MG/ML IV SOLN: 2.0000 mg | INTRAVENOUS | Status: DC | PRN

## 2016-01-10 MED ORDER — PRAVASTATIN SODIUM 40 MG PO TABS
40.0000 mg | ORAL_TABLET | Freq: Every day | ORAL | Status: DC
  Administered 2016-01-10 – 2016-01-11 (×2): 40 mg via ORAL
  Filled 2016-01-10 (×2): qty 1

## 2016-01-10 MED ORDER — SODIUM CHLORIDE 0.9% FLUSH
3.0000 mL | Freq: Two times a day (BID) | INTRAVENOUS | Status: DC
  Administered 2016-01-11 – 2016-01-12 (×3): 3 mL via INTRAVENOUS

## 2016-01-10 MED ORDER — CLONAZEPAM 0.5 MG PO TABS: 0.5000 mg | ORAL_TABLET | Freq: Three times a day (TID) | ORAL | Status: DC | PRN

## 2016-01-10 MED ORDER — AMIODARONE HCL 200 MG PO TABS
200.0000 mg | ORAL_TABLET | Freq: Every day | ORAL | Status: DC
  Administered 2016-01-11 – 2016-01-12 (×2): 200 mg via ORAL
  Filled 2016-01-10 (×2): qty 1

## 2016-01-10 MED ORDER — ONDANSETRON HCL 4 MG/2ML IJ SOLN: 4.0000 mg | Freq: Four times a day (QID) | INTRAMUSCULAR | Status: DC | PRN

## 2016-01-10 MED ORDER — SODIUM CHLORIDE 0.9% FLUSH
3.0000 mL | Freq: Two times a day (BID) | INTRAVENOUS | Status: DC
  Administered 2016-01-10 – 2016-01-12 (×3): 3 mL via INTRAVENOUS

## 2016-01-10 MED ORDER — ACETAMINOPHEN 325 MG PO TABS: 650.0000 mg | ORAL_TABLET | ORAL | Status: DC | PRN

## 2016-01-10 MED ORDER — LEVOTHYROXINE SODIUM 25 MCG PO TABS
137.0000 ug | ORAL_TABLET | Freq: Every day | ORAL | Status: DC
  Administered 2016-01-11 – 2016-01-12 (×2): 137 ug via ORAL
  Filled 2016-01-10 (×2): qty 1

## 2016-01-10 MED ORDER — TRAZODONE HCL 50 MG PO TABS
50.0000 mg | ORAL_TABLET | Freq: Every evening | ORAL | Status: DC | PRN
  Administered 2016-01-10 – 2016-01-11 (×2): 50 mg via ORAL
  Filled 2016-01-10 (×2): qty 1

## 2016-01-10 MED ORDER — ROPINIROLE HCL 0.5 MG PO TABS
0.5000 mg | ORAL_TABLET | Freq: Once | ORAL | Status: AC
  Administered 2016-01-10: 0.5 mg via ORAL
  Filled 2016-01-10: qty 1

## 2016-01-10 MED ORDER — ROPINIROLE HCL 1 MG PO TABS
0.5000 mg | ORAL_TABLET | Freq: Two times a day (BID) | ORAL | Status: DC | PRN
  Administered 2016-01-10: 0.5 mg via ORAL
  Filled 2016-01-10 (×2): qty 1

## 2016-01-10 MED ORDER — ONDANSETRON HCL 4 MG PO TABS: 4.0000 mg | ORAL_TABLET | Freq: Four times a day (QID) | ORAL | Status: DC | PRN

## 2016-01-10 MED ORDER — ACETAMINOPHEN 650 MG RE SUPP: 650.0000 mg | Freq: Four times a day (QID) | RECTAL | Status: DC | PRN

## 2016-01-10 MED ORDER — OXYCODONE HCL 5 MG PO TABS: 5.0000 mg | ORAL_TABLET | ORAL | Status: DC | PRN

## 2016-01-10 MED ORDER — TORSEMIDE 20 MG PO TABS: 20.0000 mg | ORAL_TABLET | Freq: Every day | ORAL | Status: DC | PRN

## 2016-01-10 MED ORDER — ACETAMINOPHEN 325 MG PO TABS: 650.0000 mg | ORAL_TABLET | Freq: Four times a day (QID) | ORAL | Status: DC | PRN

## 2016-01-10 MED ORDER — APIXABAN 5 MG PO TABS
5.0000 mg | ORAL_TABLET | Freq: Two times a day (BID) | ORAL | Status: DC
  Administered 2016-01-10 – 2016-01-12 (×4): 5 mg via ORAL
  Filled 2016-01-10 (×4): qty 1

## 2016-01-10 MED ORDER — STROKE: EARLY STAGES OF RECOVERY BOOK: Freq: Once | Status: DC

## 2016-01-10 MED ORDER — GI COCKTAIL ~~LOC~~
30.0000 mL | Freq: Four times a day (QID) | ORAL | Status: DC | PRN
  Filled 2016-01-10: qty 30

## 2016-01-10 MED ORDER — SODIUM CHLORIDE 0.9% FLUSH: 3.0000 mL | INTRAVENOUS | Status: DC | PRN

## 2016-01-10 MED ORDER — SENNA 8.6 MG PO TABS
1.0000 | ORAL_TABLET | Freq: Every day | ORAL | Status: DC
  Administered 2016-01-10 – 2016-01-12 (×3): 8.6 mg via ORAL
  Filled 2016-01-10 (×3): qty 1

## 2016-01-10 MED ORDER — SODIUM CHLORIDE 0.9 % IV SOLN: 250.0000 mL | INTRAVENOUS | Status: DC | PRN

## 2016-01-10 MED ORDER — LUBIPROSTONE 24 MCG PO CAPS
24.0000 ug | ORAL_CAPSULE | Freq: Every day | ORAL | Status: DC
  Administered 2016-01-10 – 2016-01-12 (×3): 24 ug via ORAL
  Filled 2016-01-10 (×3): qty 1

## 2016-01-10 MED ORDER — TAMSULOSIN HCL 0.4 MG PO CAPS
0.4000 mg | ORAL_CAPSULE | Freq: Every day | ORAL | Status: DC
  Administered 2016-01-10 – 2016-01-12 (×3): 0.4 mg via ORAL
  Filled 2016-01-10 (×3): qty 1

## 2016-01-10 MED ORDER — PANTOPRAZOLE SODIUM 40 MG PO TBEC
40.0000 mg | DELAYED_RELEASE_TABLET | Freq: Every day | ORAL | Status: DC
  Administered 2016-01-10 – 2016-01-12 (×3): 40 mg via ORAL
  Filled 2016-01-10 (×3): qty 1

## 2016-01-10 NOTE — H&P (Addendum)
History and Physical    Adam [name] [id number] DOB: Mar 05, 1926 DOA: 01/10/2016  PCP: Jeanmarie Hubert, MD Patient coming from: home  Chief Complaint: R sided weakness and cp  HPI: Adam [name] is a 82 y.o. male with medical history significant of restless legs, MI/CAD status post four-vessel CABG, PAF, SSS status post pacemaker placement. CHF, , GERD, CVA, H LD, HTN, hypothyroidism, insomnia. Patient with acute onset right upper extremity and right lower extremity weakness. Lasted approximately 2 hours. Associated with a these sensation. History of similar episodes in the past though significantly more mild than today's event. Denies any other associated symptoms such as fever, nausea, vomiting, diaphoresis, chest pain, palpitations, LOC. Episode occurred at approximately 11:00. Of note patient with history of recent CVA with complete resolution of all deficits from that event. Associated with headach and chest pain. Substernal. This also has resolved without intervention. Nonexertional. No radiation of pain to neck or arm or associated diaphoresis or nausea..   ED Course: objective findings outlined below  Review of Systems: As per HPI otherwise 10 point review of systems negative.   Ambulatory Status: few residual deficits from previous stroke  Past Medical History:  Diagnosis Date  . Arthritis    "minor, back and sometimes knees" (12/15/2012)  . Bradycardia    AFib/SSS s/p St Jude PPM 04/12/2008  . CAD (coronary artery disease)    CABG (LIMA-LAD, SVG-RCA, SVG-OM in 1996).  07/2009 BMS to SVG-RCA. Cath in 04/2010 with patent stents   . Cardiomyopathy, ischemic 08/25/2012  . CHF (congestive heart failure) ([place])   . Chronic knee pain 12/03/2014  . Combined congestive systolic and diastolic heart failure ([place]) 02/02/2015   Hx EF 41%. BNP 96.8 02/21/15 Torsemide 04/06/15 Na 142, K 4.6, Bun 16, creat 0.89 04/20/14 BNP 111.7, Na 142, K 4.6, Bun 16, creat 0.9   . Coronary artery disease with hx  of myocardial infarct w/o hx of CABG 12/30/2014   CABG (LIMA-LAD, SVG-RCA, SVG-OM in 1996).  07/2009 BMS to SVG-RCA. Cath in 04/2010 with patent stents   . Depression with anxiety 02/02/2015   02/21/15 Hgb A1c 5.8 03/10/15 MMSE 30/30   . Dizziness, after diuretic asscoiated with hypotension and responded to fluid bolus 06/05/2011   04/28/15 US carotid R+L normal bilateral arterial velocities.    Marland Kitchen Dyspnea 08/11/2014   Followed in Pulmonary clinic/ [facility]/ [facility]  - 08/11/2014  Walked RA x 1 laps @ 185 ft each stopped due to fatigue/off balance/ slow pace/  no sob or desat  - PFT's  09/26/2014  FEV1 2.26 (85 % ) ratio 76  p no % improvement from saba with DLCO  67 % corrects to 93 % for alv volume      Since prev study 08/04/13 minimal change lung vol or dlco    . Embolic cerebral infarction ([place]) 12/06/2015  . Exertional shortness of breath    "sometimes walking" (12/15/2012)  . GERD (gastroesophageal reflux disease)   . Gout 02/09/2015  . Heart murmur    "just told I had one today" (12/15/2012)  . Hiatal hernia   . Hyperlipidemia   . Hypertension   . Hypothyroid   . Insomnia   . Melanoma of back ([place]) 1976  . Myocardial infarction 1996; 2011   "both silent" (12/15/2012)  . Orthostatic hypotension   . Osteoporosis, senile   . Pacemaker   . RBBB   . Restless leg 02/02/2015  . Right leg weakness 12/06/2015  . [facility] spotted fever   .  S/P CABG x 4   . Sick sinus syndrome ([place]) 01/31/2014  . Sustained ventricular tachycardia ([place]) 07/27/2014  . Urinary retention 12/30/2014    Past Surgical History:  Procedure Laterality Date  . CARDIAC CATHETERIZATION  04/2010   LIMA to LAD patent,SVG to OM patent,no in-stnet restenosis RCA  . CATARACT EXTRACTION W/ INTRAOCULAR LENS  IMPLANT, BILATERAL Bilateral 2012  . CORONARY ANGIOPLASTY WITH STENT PLACEMENT  07/2009   bare metal stent to SVG to the RCA  . CORONARY ARTERY BYPASS GRAFT  1996   LIMA to LAD,SVG to RCA & SVG to OM  . INSERT /  REPLACE / REMOVE PACEMAKER  2010  . MELANOMA EXCISION  05/1974 X2   "taken off my back" (12/15/2012)  . NM MYOVIEW LTD  06/2011   low risk  . TONSILLECTOMY  1938  . TRANSURETHRAL RESECTION OF PROSTATE  1986  . US ECHOCARDIOGRAPHY  07/11/2009   EF 45-50%    Social History   Social History  . Marital status: Married    Spouse name: N/A  . Number of children: 2  . Years of education: Masters   Occupational History  . Retired Company secretary -Pensions consultant    Social History Main Topics  . Smoking status: Never Smoker  . Smokeless tobacco: Never Used  . Alcohol use No  . Drug use: No  . Sexual activity: No   Other Topics Concern  . Not on file   Social History Narrative   Lives at [place] to [facility] 01/09/15   Married - Violet   Never smoked   Alcohol none   Previously employed as Scientist, forensic for [employer].         Diet:Low sodium   Do you drink/eat things with caffeine? No   Marital status: Married                              What year were you married?1950   Do you live in a house, apartment, assisted living, condo, trailer, etc)?    Is it one or more stories? 1   How many persons live in your home? 2   Do you have any pets in your home? No   Current or past profession: Minister, [employer]   Do you exercise?     Very Little                                                 Type & how often:    Do you have a living will?  Yes   Do you have a DNR Form? Yes   Do you have a POA/HPOA forms? Yes    Allergies  Allergen Reactions  . Crestor [Rosuvastatin Calcium]     Unknown reaction   . Altace [Ramipril] Other (See Comments)    Cough  . Bactrim [Sulfamethoxazole-Trimethoprim] Rash  . Penicillins Rash    Has patient had a PCN reaction causing immediate rash, facial/tongue/throat swelling, SOB or lightheadedness with hypotension: No Has patient had a PCN reaction causing severe rash involving mucus membranes or skin necrosis: Yes Has  patient had a PCN reaction that required hospitalization Yes Has patient had a PCN reaction occurring within the last 10 years: No If all of the above answers are "NO", then may proceed with  Cephalosporin use.   . Sulfa Antibiotics Rash    Family History  Problem Relation Age of Onset  . Coronary artery disease Mother   . Diabetes Mother   . Heart disease Mother   . Coronary artery disease Father   . Diabetes Father   . Lung cancer Father   . Heart disease Father   . Anuerysm Son   . Heart disease Brother     Prior to Admission medications   Medication Sig Start Date End Date Taking? Authorizing Provider  amiodarone (PACERONE) 200 MG tablet Take 200 mg by mouth daily.    Historical Provider, MD  apixaban (ELIQUIS) 5 MG TABS tablet Take 1 tablet (5 mg total) by mouth 2 (two) times daily. 01/05/16   Gildardo Cranker, DO  bisacodyl (DULCOLAX) 5 MG EC tablet Take 5 mg by mouth daily as needed for moderate constipation.    Historical Provider, MD  Cholecalciferol (VITAMIN D3) 5000 UNITS CAPS Take 1 capsule by mouth every Monday. Reported on 02/22/2015    Historical Provider, MD  clobetasol (TEMOVATE) 0.05 % external solution Apply 1 application topically. Apply once or twice daily on scaple    Historical Provider, MD  clonazePAM (KLONOPIN) 0.5 MG tablet Take one tablet by mouth every 4 hours as needed for anxiety/restless legs 01/05/16   Gildardo Cranker, DO  levothyroxine (SYNTHROID, LEVOTHROID) 137 MCG tablet Take 1 tablet (137 mcg total) by mouth daily before breakfast. 01/05/16   Gildardo Cranker, DO  lubiprostone (AMITIZA) 8 MCG capsule Take 24 mcg by mouth daily with breakfast.     Historical Provider, MD  midodrine (PROAMATINE) 5 MG tablet Take 1 tablet (5 mg total) by mouth 3 (three) times daily with meals. Take third dose at least 6 hours before bedtime 01/05/16   Gildardo Cranker, DO  pantoprazole (PROTONIX) 40 MG tablet Take one tablet by mouth at bedtime to reduce stomach acid 01/05/16   Gildardo Cranker, DO  pravastatin (PRAVACHOL) 40 MG tablet Take 1 tablet (40 mg total) by mouth daily. To lower cholesterol 10/23/15   Estill Dooms, MD  rOPINIRole (REQUIP) 0.5 MG tablet Take 1 tablet (0.5 mg total) by mouth 2 (two) times daily. 01/05/16   Gildardo Cranker, DO  SENNA PO Take by mouth. Take 2 tablets daily for constipation    Historical Provider, MD  tamsulosin (FLOMAX) 0.4 MG CAPS capsule Take one capsule by mouth once daily to help improve urinary frequency 01/05/16   Gildardo Cranker, DO  torsemide (DEMADEX) 20 MG tablet Take 1 tablets daily if you gain 3 or more pounds weight. Stop when weight returns to normal.. 01/05/16   Gildardo Cranker, DO  traZODone (DESYREL) 50 MG tablet TAKE 1 TABLET AT BEDTIME AS NEEDED. 01/10/16   Estill Dooms, MD    Physical Exam: Vitals:   01/10/16 1548 01/10/16 1600 01/10/16 1630 01/10/16 1700  BP:  131/79 137/72 128/72  Pulse:  69 69 69  Resp:  19 17 17   Temp: 97.5 F (36.4 C)   97.7 F (36.5 C)  TempSrc:    Oral  SpO2:  97% 95% 97%     General:  Appears calm and comfortable Eyes:  PERRL, EOMI, normal lids, iris ENT:  grossly normal hearing, lips & tongue, mmm Neck:  no LAD, masses or thyromegaly Cardiovascular:  RRR, 3/6 systolic murmur, 1+ bilateral lower extremity edema Respiratory:  CTA bilaterally, no w/r/r. Normal respiratory effort. Abdomen:  soft, ntnd, NABS Skin:  no rash or induration seen on limited  exam Musculoskeletal:  grossly normal tone BUE/BLE, good ROM, no bony abnormality Psychiatric:  grossly normal mood and affect, speech fluent and appropriate, AOx3 Neurologic:  CN 2-12 grossly intact, moves all extremities in coordinated fashion, sensation intact  Labs on Admission: I have personally reviewed following labs and imaging studies  CBC:  Recent Labs Lab 01/10/16 1400 01/10/16 1414  WBC 6.0  --   NEUTROABS 4.1  --   HGB 14.1 14.3  HCT 42.7 42.0  MCV 97.7  --   PLT 190  --    Basic Metabolic Panel:  Recent Labs Lab  01/10/16 1400 01/10/16 1414  NA 140 142  K 4.0 3.9  CL 106 102  CO2 26  --   GLUCOSE 106* 99  BUN 14 15  CREATININE 0.84 0.80  CALCIUM 9.0  --    GFR: CrCl cannot be calculated (Unknown ideal weight.). Liver Function Tests:  Recent Labs Lab 01/10/16 1400  AST 25  ALT 22  ALKPHOS 68  BILITOT 0.4  PROT 6.4*  ALBUMIN 3.5   No results for input(s): LIPASE, AMYLASE in the last 168 hours. No results for input(s): AMMONIA in the last 168 hours. Coagulation Profile:  Recent Labs Lab 01/10/16 1400  INR 1.14   Cardiac Enzymes: No results for input(s): CKTOTAL, CKMB, CKMBINDEX, TROPONINI in the last 168 hours. BNP (last 3 results) No results for input(s): PROBNP in the last 8760 hours. HbA1C: No results for input(s): HGBA1C in the last 72 hours. CBG: No results for input(s): GLUCAP in the last 168 hours. Lipid Profile: No results for input(s): CHOL, HDL, LDLCALC, TRIG, CHOLHDL, LDLDIRECT in the last 72 hours. Thyroid Function Tests: No results for input(s): TSH, T4TOTAL, FREET4, T3FREE, THYROIDAB in the last 72 hours. Anemia Panel: No results for input(s): VITAMINB12, FOLATE, FERRITIN, TIBC, IRON, RETICCTPCT in the last 72 hours. Urine analysis:    Component Value Date/Time   COLORURINE STRAW (A) 01/10/2016 1405   APPEARANCEUR CLEAR 01/10/2016 1405   LABSPEC 1.010 01/10/2016 1405   PHURINE 6.0 01/10/2016 1405   GLUCOSEU NEGATIVE 01/10/2016 1405   HGBUR NEGATIVE 01/10/2016 1405   BILIRUBINUR NEGATIVE 01/10/2016 1405   KETONESUR NEGATIVE 01/10/2016 1405   PROTEINUR NEGATIVE 01/10/2016 1405   UROBILINOGEN 1.0 12/06/2014 1152   NITRITE NEGATIVE 01/10/2016 1405   LEUKOCYTESUR NEGATIVE 01/10/2016 1405    Creatinine Clearance: CrCl cannot be calculated (Unknown ideal weight.).  Sepsis Labs: @LABRCNTIP (procalcitonin:4,lacticidven:4) )No results found for this or any previous visit (from the past 240 hour(s)).   Radiological Exams on Admission: Ct Head Wo  Contrast  Result Date: 01/10/2016 CLINICAL DATA:  Sudden onset of right-sided weakness. History of chronic episodes of dizziness. EXAM: CT HEAD WITHOUT CONTRAST TECHNIQUE: Contiguous axial images were obtained from the base of the skull through the vertex without intravenous contrast. COMPARISON:  12/08/2015 FINDINGS: Brain: No evidence of acute infarction, hemorrhage, hydrocephalus, extra-axial collection or mass lesion/mass effect. There is ventricular and sulcal enlargement reflecting moderate atrophy. Vascular: No hyperdense vessel or unexpected calcification. Skull: Normal. Negative for fracture or focal lesion. Sinuses/Orbits: Visualize globes orbits are unremarkable. Visualized sinuses and mastoid air cells are clear. Other: None. IMPRESSION: 1. No acute intracranial abnormalities. 2. Moderate atrophy. Electronically Signed   By: Lajean Manes M.D.   On: 01/10/2016 13:20    EKG: Independently reviewed. AV dual paced rhythm.  Assessment/Plan Active Problems:   Pacemaker   GERD (gastroesophageal reflux disease)   Urinary retention   Insomnia   Chronic a-fib ([facility])   Stroke-like  symptoms   Atypical chest pain   Hypotension   Hx of CABG   Stroke like symptoms: Right upper extremity and right lower extremity weakness. Similar lesser symptoms since previous stroke. Recent extensive workup for the same. Unable to obtain MRI due to pacemaker. Neurology following. CTA without significant area of stenosis or sign of lesion. - Telemetry - Stroke order set - Neuro checks - CT head in a.m. per neurology recommendation - PT/OT - ortho static VS (complaining of intermittent dizziness)  Chest pain: Doubt cardiac. EKG showing AV dual paced rhythm without overt sign of change or ACS. Troponin normal. Resolved. - Cycle troponins - Telemetry - EKG in a.m.  Hypotension: - continue midodrine  Insomnia/RLS/Anxiety: - continue trazodone, Requip, Klonopin  BPH: - continue flomax  PAF: -  continue amiodarone, Eliquis  CAD/CABG:  - continue ASA, Statin   DVT prophylaxis: Eliquis  Code Status: full  Family Communication: wife and daughter  Disposition Plan: pending workup and Neuro clearance  Consults called: Neuro Admission status: obs - Tele    MERRELL, DAVID J MD [facility]  If 7PM-7AM, please contact night-coverage [email or web address] Password [email or web address]  01/10/2016, 5:46 PM

## 2016-01-10 NOTE — Consult Note (Signed)
Requesting Physician: Dr. Regenia Skeeter    Chief Complaint: ;eft sided weakness  History obtained from:  Patient    HPI:                                                                                                                                         Adam [name] is an 82 y.o. male  with acute right-sided weakness. He states he was in his arm and leg. Started around 11 AM. He was previously up and had no symptoms before this. He also had onset of dizziness and feels like his head is dizzy and spinning. The weakness has improved the patient still feels that she's a little weak. He states that he had a stroke back in the beginning of November with similar symptoms. Has also had intermittent weakness since but it typically resolves. Today was worse than the other times. He is on Eliquis  HE also has periods of dizziness but states it often occurs in the dark when he cannot see very well and he has peripheral neuropathy  12/08/2015: Stroke work up: IMPRESSION: CTA head and neck 1. No significant stenosis, proximal occlusion, or vascular malformation of the circle of Willis. 2. 2 mm superiorly directed outpouching of bilateral A2 junction/A-comm may represent tiny aneurysm or infundibular origin of diminutive vessel. 3. No acute intracranial abnormality is identified. If symptoms persist or if clinically indicated MRI of the brain is more sensitive for acute stroke. 4. Stable mild chronic microvascular ischemic changes and parenchymal volume loss for age.  Carotid were normal  LDL 23 A1c 5.6       Date last known well: Date: 01/10/2016 Time last known well: Time: 11:00 tPA Given: No: resolved--lasted for 2 hours   Past Medical History:  Diagnosis Date  . Arthritis    "minor, back and sometimes knees" (12/15/2012)  . Bradycardia    AFib/SSS s/p St Jude PPM 04/12/2008  . CAD (coronary artery disease)    CABG (LIMA-LAD, SVG-RCA, SVG-OM in 1996).  07/2009 BMS to SVG-RCA. Cath in  04/2010 with patent stents   . Cardiomyopathy, ischemic 08/25/2012  . CHF (congestive heart failure) ([place])   . Chronic knee pain 12/03/2014  . Combined congestive systolic and diastolic heart failure ([place]) 02/02/2015   Hx EF 41%. BNP 96.8 02/21/15 Torsemide 04/06/15 Na 142, K 4.6, Bun 16, creat 0.89 04/20/14 BNP 111.7, Na 142, K 4.6, Bun 16, creat 0.9   . Coronary artery disease with hx of myocardial infarct w/o hx of CABG 12/30/2014   CABG (LIMA-LAD, SVG-RCA, SVG-OM in 1996).  07/2009 BMS to SVG-RCA. Cath in 04/2010 with patent stents   . Depression with anxiety 02/02/2015   02/21/15 Hgb A1c 5.8 03/10/15 MMSE 30/30   . Dizziness, after diuretic asscoiated with hypotension and responded to fluid bolus 06/05/2011   04/28/15 US carotid R+L normal bilateral arterial velocities.    Marland Kitchen Dyspnea  08/11/2014   Followed in Pulmonary clinic/ [facility]/ [place]  - 08/11/2014  Walked RA x 1 laps @ 185 ft each stopped due to fatigue/off balance/ slow pace/  no sob or desat  - PFT's  09/26/2014  FEV1 2.26 (85 % ) ratio 76  p no % improvement from saba with DLCO  67 % corrects to 93 % for alv volume      Since prev study 08/04/13 minimal change lung vol or dlco    . Embolic cerebral infarction ([place]) 12/06/2015  . Exertional shortness of breath    "sometimes walking" (12/15/2012)  . GERD (gastroesophageal reflux disease)   . Gout 02/09/2015  . Heart murmur    "just told I had one today" (12/15/2012)  . Hiatal hernia   . Hyperlipidemia   . Hypertension   . Hypothyroid   . Insomnia   . Melanoma of back ([place]) 1976  . Myocardial infarction 1996; 2011   "both silent" (12/15/2012)  . Orthostatic hypotension   . Osteoporosis, senile   . Pacemaker   . RBBB   . Restless leg 02/02/2015  . Right leg weakness 12/06/2015  . [facility] spotted fever   . S/P CABG x 4   . Sick sinus syndrome ([place]) 01/31/2014  . Sustained ventricular tachycardia ([place]) 07/27/2014  . Urinary retention 12/30/2014    Past Surgical History:   Procedure Laterality Date  . CARDIAC CATHETERIZATION  04/2010   LIMA to LAD patent,SVG to OM patent,no in-stnet restenosis RCA  . CATARACT EXTRACTION W/ INTRAOCULAR LENS  IMPLANT, BILATERAL Bilateral 2012  . CORONARY ANGIOPLASTY WITH STENT PLACEMENT  07/2009   bare metal stent to SVG to the RCA  . CORONARY ARTERY BYPASS GRAFT  1996   LIMA to LAD,SVG to RCA & SVG to OM  . INSERT / REPLACE / REMOVE PACEMAKER  2010  . MELANOMA EXCISION  05/1974 X2   "taken off my back" (12/15/2012)  . NM MYOVIEW LTD  06/2011   low risk  . TONSILLECTOMY  1938  . TRANSURETHRAL RESECTION OF PROSTATE  1986  . US ECHOCARDIOGRAPHY  07/11/2009   EF 45-50%    Family History  Problem Relation Age of Onset  . Coronary artery disease Mother   . Diabetes Mother   . Heart disease Mother   . Coronary artery disease Father   . Diabetes Father   . Lung cancer Father   . Heart disease Father   . Anuerysm Son   . Heart disease Brother    Social History:  reports that he has never smoked. He has never used smokeless tobacco. He reports that he does not drink alcohol or use drugs.  Allergies:  Allergies  Allergen Reactions  . Crestor [Rosuvastatin Calcium]     Unknown reaction   . Altace [Ramipril] Other (See Comments)    Cough  . Bactrim [Sulfamethoxazole-Trimethoprim] Rash  . Penicillins Rash    Has patient had a PCN reaction causing immediate rash, facial/tongue/throat swelling, SOB or lightheadedness with hypotension: No Has patient had a PCN reaction causing severe rash involving mucus membranes or skin necrosis: Yes Has patient had a PCN reaction that required hospitalization Yes Has patient had a PCN reaction occurring within the last 10 years: No If all of the above answers are "NO", then may proceed with Cephalosporin use.   . Sulfa Antibiotics Rash    Medications:  No current  facility-administered medications for this encounter.    Current Outpatient Prescriptions  Medication Sig Dispense Refill  . amiodarone (PACERONE) 200 MG tablet Take 200 mg by mouth daily.    Marland Kitchen apixaban (ELIQUIS) 5 MG TABS tablet Take 1 tablet (5 mg total) by mouth 2 (two) times daily. 180 tablet 3  . bisacodyl (DULCOLAX) 5 MG EC tablet Take 5 mg by mouth daily as needed for moderate constipation.    . Cholecalciferol (VITAMIN D3) 5000 UNITS CAPS Take 1 capsule by mouth every Monday. Reported on 02/22/2015    . clobetasol (TEMOVATE) 0.05 % external solution Apply 1 application topically. Apply once or twice daily on scaple    . clonazePAM (KLONOPIN) 0.5 MG tablet Take one tablet by mouth every 4 hours as needed for anxiety/restless legs 180 tablet 3  . levothyroxine (SYNTHROID, LEVOTHROID) 137 MCG tablet Take 1 tablet (137 mcg total) by mouth daily before breakfast. 90 tablet 3  . lubiprostone (AMITIZA) 8 MCG capsule Take 24 mcg by mouth daily with breakfast.     . midodrine (PROAMATINE) 5 MG tablet Take 1 tablet (5 mg total) by mouth 3 (three) times daily with meals. Take third dose at least 6 hours before bedtime 270 tablet 3  . pantoprazole (PROTONIX) 40 MG tablet Take one tablet by mouth at bedtime to reduce stomach acid 90 tablet 3  . pravastatin (PRAVACHOL) 40 MG tablet Take 1 tablet (40 mg total) by mouth daily. To lower cholesterol 90 tablet 1  . rOPINIRole (REQUIP) 0.5 MG tablet Take 1 tablet (0.5 mg total) by mouth 2 (two) times daily. 180 tablet 3  . SENNA PO Take by mouth. Take 2 tablets daily for constipation    . tamsulosin (FLOMAX) 0.4 MG CAPS capsule Take one capsule by mouth once daily to help improve urinary frequency 90 capsule 3  . torsemide (DEMADEX) 20 MG tablet Take 1 tablets daily if you gain 3 or more pounds weight. Stop when weight returns to normal.. 30 tablet 11  . traZODone (DESYREL) 50 MG tablet TAKE 1 TABLET AT BEDTIME AS NEEDED. 30 tablet 0     ROS:                                                                                                                                        History obtained from the patient  General ROS: negative for - chills, fatigue, fever, night sweats, weight gain or weight loss Psychological ROS: negative for - behavioral disorder, hallucinations, memory difficulties, mood swings or suicidal ideation Ophthalmic ROS: negative for - blurry vision, double vision, eye pain or loss of vision ENT ROS: negative for - epistaxis, nasal discharge, oral lesions, sore throat, tinnitus or vertigo Allergy and Immunology ROS: negative for - hives or itchy/watery eyes Hematological and Lymphatic ROS: negative for - bleeding problems, bruising or swollen lymph nodes Endocrine ROS: negative for - galactorrhea,  hair pattern changes, polydipsia/polyuria or temperature intolerance Respiratory ROS: negative for - cough, hemoptysis, shortness of breath or wheezing Cardiovascular ROS: negative for - chest pain, dyspnea on exertion, edema or irregular heartbeat Gastrointestinal ROS: negative for - abdominal pain, diarrhea, hematemesis, nausea/vomiting or stool incontinence Genito-Urinary ROS: negative for - dysuria, hematuria, incontinence or urinary frequency/urgency Musculoskeletal ROS: negative for - joint swelling or muscular weakness Neurological ROS: as noted in HPI Dermatological ROS: negative for rash and skin lesion changes  Neurologic Examination:                                                                                                      Blood pressure 135/76, pulse 70, temperature 97.4 F (36.3 C), temperature source Oral, resp. rate 19, SpO2 93 %.  HEENT-  Normocephalic, no lesions, without obvious abnormality.  Normal external eye and conjunctiva.  Normal TM's bilaterally.  Normal auditory canals and external ears. Normal external nose, mucus membranes and septum.  Normal pharynx. Cardiovascular- S1, S2 normal, pulses palpable  throughout   Lungs- chest clear, no wheezing, rales, normal symmetric air entry Abdomen- soft, non-tender; bowel sounds normal; no masses,  no organomegaly Extremities- no edema Lymph-no adenopathy palpable Musculoskeletal-no joint tenderness, deformity or swelling Skin-warm and dry, no hyperpigmentation, vitiligo, or suspicious lesions  Neurological Examination Mental Status: Alert, oriented, thought content appropriate.  Speech fluent without evidence of aphasia.  Able to follow 3 step commands without difficulty. Cranial Nerves: II: Discs flat bilaterally; Visual fields grossly normal,  III,IV, VI: ptosis not present, extra-ocular motions intact bilaterally, pupils equal, round, reactive to light and accommodation V,VII: smile asymmetric on the right, facial light touch sensation normal bilaterally VIII: hearing normal bilaterally IX,X: uvula rises symmetrically XI: bilateral shoulder shrug XII: midline tongue extension Motor: Right : Upper extremity   5/5    Left:     Upper extremity   5/5 --bradykinesia with movements in UE  Lower extremity   5/5     Lower extremity   5/5 --brady kinesia with LE in movements Tone and bulk:normal tone throughout; no atrophy noted Sensory: Pinprick and light touch intact throughout, bilaterally UE with decreased cold sensation up to upper calf, decreased vibrations but intact proprioception in feet Deep Tendon Reflexes: 1+ and symmetric throughout Plantars: Right: downgoing   Left: downgoing Cerebellar: normal finger-to-nose,  and normal heel-to-shin test Gait: not tested       Lab Results: Basic Metabolic Panel:  Recent Labs Lab 01/10/16 1400 01/10/16 1414  NA 140 142  K 4.0 3.9  CL 106 102  CO2 26  --   GLUCOSE 106* 99  BUN 14 15  CREATININE 0.84 0.80  CALCIUM 9.0  --     Liver Function Tests:  Recent Labs Lab 01/10/16 1400  AST 25  ALT 22  ALKPHOS 68  BILITOT 0.4  PROT 6.4*  ALBUMIN 3.5   No results for  input(s): LIPASE, AMYLASE in the last 168 hours. No results for input(s): AMMONIA in the last 168 hours.  CBC:  Recent Labs Lab 01/10/16 1400 01/10/16 1414  WBC  6.0  --   NEUTROABS 4.1  --   HGB 14.1 14.3  HCT 42.7 42.0  MCV 97.7  --   PLT 190  --     Cardiac Enzymes: No results for input(s): CKTOTAL, CKMB, CKMBINDEX, TROPONINI in the last 168 hours.  Lipid Panel: No results for input(s): CHOL, TRIG, HDL, CHOLHDL, VLDL, LDLCALC in the last 168 hours.  CBG: No results for input(s): GLUCAP in the last 168 hours.  Microbiology: Results for orders placed or performed during the hospital encounter of 07/10/09  MRSA PCR Screening     Status: None   Collection Time: 07/10/09 11:36 PM  Result Value Ref Range Status   MRSA by PCR  NEGATIVE Final    NEGATIVE        The GeneXpert MRSA Assay (FDA approved for NASAL specimens only), is one component of a comprehensive MRSA colonization surveillance program. It is not intended to diagnose MRSA infection nor to guide or monitor treatment for MRSA infections.    Coagulation Studies:  Recent Labs  01/10/16 1400  LABPROT 14.7  INR 1.14    Imaging: Ct Head Wo Contrast  Result Date: 01/10/2016 CLINICAL DATA:  Sudden onset of right-sided weakness. History of chronic episodes of dizziness. EXAM: CT HEAD WITHOUT CONTRAST TECHNIQUE: Contiguous axial images were obtained from the base of the skull through the vertex without intravenous contrast. COMPARISON:  12/08/2015 FINDINGS: Brain: No evidence of acute infarction, hemorrhage, hydrocephalus, extra-axial collection or mass lesion/mass effect. There is ventricular and sulcal enlargement reflecting moderate atrophy. Vascular: No hyperdense vessel or unexpected calcification. Skull: Normal. Negative for fracture or focal lesion. Sinuses/Orbits: Visualize globes orbits are unremarkable. Visualized sinuses and mastoid air cells are clear. Other: None. IMPRESSION: 1. No acute intracranial  abnormalities. 2. Moderate atrophy. Electronically Signed   By: Lajean Manes M.D.   On: 01/10/2016 13:20       Assessment and plan discussed with with attending physician and they are in agreement.    Etta Quill PA-C [facility] [phone]  01/10/2016, 3:15 PM   Assessment: 82 y.o. male presenting to the hospital after patient suffered from approximately a 2 hour period of right upper and lower extremity weakness. Patient's symptoms of fully resolved at this time. Patient was recently seen in the hospital approximately one month ago and had a full stroke workup which was negative. As stated he is on Eliquis and has not missed any doses. Head CT was ordered which showed no acute abnormalities. Due to pacemaker he is unable to have a MRI. Most recent stroke workup showed within normal limits of LDL and A1c. CTA of head and neck were within normal limits.  Long discussion was also had about patient's feeling dizzy. He is noted that he feels dizzy when his lites are off or he cannot see very well. I discussed with the family that he does have decreased vibratory sensation and light touch/temperature sensation and likely with his peripheral vascular disease has neuropathy. I have explained to them that if he cannot see very well and cannot feel his feet very well this will often cause a person to feel dizzy. Patient's family admits that he has a wide-based gait. He does walk with a walker. There was also discussion about his restless leg syndrome. I advised him to go back to Dr. Rexene Alberts his primary neurologist who he sees for this.  Stroke Risk Factors - hyperlipidemia and hypertension on Eliquis  Recommend: At this time would recommend patient having CT of  head without contrast tomorrow for further evaluation. He will also need physical therapy. Patient should remain on Eliquis at this time.   Dr. Leonel Ramsay to addend this note     I have seen and evaluated the patient. I have  reviewed the above note and made appropriate changes.    Adam [name] is an 82 year old male with recent right arm and leg numbness/weakness worsening the setting of previous episode of right leg weakness with aphasia. I suspect that he may have extension of a previous infarct.  He reports that he is not able to walk, and therefore he will need physical therapy. On exam, confrontation he seems relatively preserved compared to his reported symptoms.  He also complains of dizziness with standing. This could be related to orthostasis.  1) repeat head CT tomorrow 2) orthostatic vital signs  Roland Rack, MD [facility] [phone]  If 7pm- 7am, please page neurology on call as listed in [place].

## 2016-01-10 NOTE — Progress Notes (Signed)
STROKE TEAM PROGRESS NOTE   HISTORY OF PRESENT ILLNESS (per record) Adam [name] is an 82 y.o. male with acute right-sided weakness in his arm and leg. Started around 11 AM 01/10/2016 ([facility]). He was previously up and had no symptoms before this. He also had onset of dizziness and feels like his head is dizzy and spinning. The weakness has improved the patient still feels that he's a little weak. He states that he had a stroke back in the beginning of November with similar symptoms. Has also had intermittent weakness since but it typically resolves. Today was worse than the other times. He is on Eliquis. HE also has periods of dizziness but states it often occurs in the dark when he cannot see very well and he has peripheral neuropathy. Patient was not administered IV t-PA secondary to being on eliquis. He was admitted for further evaluation and treatment.   SUBJECTIVE (INTERVAL HISTORY) Pt wife is at bedside. Pt stated that he was standing and shaving, suddenly he felt lightheadedness and had to sit down, he felt whole body weakness. After sat down, he felt better, called EMS and symptoms resolved on arrival.   CT and CT repeat did not show acute abnormalities. Suspect orthostatic hypotension which he has had in the past. Recommended compression socks above the knee.    OBJECTIVE Temp:  [97.5 F (36.4 C)-98.1 F (36.7 C)] 98 F (36.7 C) (12/07 0957) Pulse Rate:  [66-70] 70 (12/07 0957) Cardiac Rhythm: A-V Sequential paced (12/07 1320) Resp:  [13-20] 17 (12/07 0957) BP: (71-137)/(46-79) 71/46 (12/07 0957) SpO2:  [95 %-98 %] 95 % (12/07 0957)  CBC:   Recent Labs Lab 01/10/16 1400 01/10/16 1414 01/11/16 0537  WBC 6.0  --  6.4  NEUTROABS 4.1  --   --   HGB 14.1 14.3 13.9  HCT 42.7 42.0 42.2  MCV 97.7  --  97.2  PLT 190  --  0000000    Basic Metabolic Panel:   Recent Labs Lab 01/10/16 1400 01/10/16 1414 01/11/16 0537  NA 140 142 140  K 4.0 3.9 4.1  CL 106 102 106  CO2 26  --   27  GLUCOSE 106* 99 97  BUN 14 15 13   CREATININE 0.84 0.80 0.79  CALCIUM 9.0  --  8.8*    Lipid Panel:     Component Value Date/Time   CHOL 84 12/07/2015 0449   TRIG 102 12/07/2015 0449   HDL 41 12/07/2015 0449   CHOLHDL 2.0 12/07/2015 0449   VLDL 20 12/07/2015 0449   LDLCALC 23 12/07/2015 0449   HgbA1c:  Lab Results  Component Value Date   HGBA1C 5.6 12/07/2015   Urine Drug Screen:     Component Value Date/Time   LABOPIA NONE DETECTED 01/10/2016 1405   COCAINSCRNUR NONE DETECTED 01/10/2016 1405   LABBENZ NONE DETECTED 01/10/2016 1405   AMPHETMU NONE DETECTED 01/10/2016 1405   THCU NONE DETECTED 01/10/2016 1405   LABBARB NONE DETECTED 01/10/2016 1405      IMAGING I have personally reviewed the radiological images below and agree with the radiology interpretations.  Ct Head Wo Contrast 01/10/2016 1. No acute intracranial abnormalities. 2. Moderate atrophy.   Ct Head Wo Contrast 01/11/2016 Stable exam. Atrophy and small vessel disease. No acute intracranial findings.   Dg Chest Port 1 View 01/11/2016 1. Cardiac pacer in stable position. Prior CABG. Stable cardiomegaly. 2.  Low lung volumes.  Interim improvement of bibasilar atelectasis.   Nuclear stress test 04/2015  Nuclear  stress EF: 32%.  There was no ST segment deviation noted during stress.  Defect 1: There is a medium defect of severe severity present in the basal inferior, basal inferolateral, mid inferior and mid inferolateral location.  Findings consistent with prior myocardial infarction.  This is an intermediate risk study.  The left ventricular ejection fraction is moderately decreased (30-44%).  Akinesis of the basal and mid inferior and inferolateral walls and paradoxical septal motion. There is a sacr in the basal and mid inferior and inferolateral leads with associated akinesis. No ischemia.  TTE pending    PHYSICAL EXAM  01/11/16 1100 BP- Lying BP- Sitting BP- Standing at 0 minutes    01/11/16 1100 102/61 93/56 (!) 85/48    Temp:  [97.7 F (36.5 C)-98.3 F (36.8 C)] 97.8 F (36.6 C) (12/07 2114) Pulse Rate:  [67-70] 68 (12/07 2114) Resp:  [16-20] 18 (12/07 2114) BP: (71-157)/(46-88) 157/88 (12/07 2114) SpO2:  [95 %-97 %] 95 % (12/07 2114)  General - Well nourished, well developed, in no apparent distress.  Ophthalmologic - Fundi not visualized due to eye movement.  Cardiovascular - Regular rate and rhythm.  Mental Status -  Level of arousal and orientation to time, place, and person were intact. Language including expression, naming, repetition, comprehension was assessed and found intact. Fund of Knowledge was assessed and was intact.  Cranial Nerves II - XII - II - Visual field intact OU. III, IV, VI - Extraocular movements intact. V - Facial sensation intact bilaterally. VII - Facial movement intact bilaterally. VIII - Hearing & vestibular intact bilaterally. X - Palate elevates symmetrically. XI - Chin turning & shoulder shrug intact bilaterally. XII - Tongue protrusion intact.  Motor Strength - The patient's strength was normal in all extremities and pronator drift was absent.  Bulk was normal and fasciculations were absent.   Motor Tone - Muscle tone was assessed at the neck and appendages and was normal.  Reflexes - The patient's reflexes were 1+ in all extremities and he had no pathological reflexes.  Sensory - Light touch, temperature/pinprick were assessed and were symmetrical.    Coordination - The patient had normal movements in the hands with no ataxia or dysmetria.  Tremor was absent.  Gait and Station - not tested due to safety concerns   ASSESSMENT/PLAN Mr. Adam [name] is a 82 y.o. male with history of previous stroke in Nov, restless legs, MI/CAD status post four-vessel CABG, PAF, SSS status post pacemaker placement, CHF, GERD, H LD, HTN, hypothyroidism, and insomnia presenting with generalized weakness and dizziness and CP. He  did not receive IV t-PA due to being on Eliquis.   Orthostatic hypotension  Resultant  Dizziness and weakness resolved  MRI  / MRA  pacer  Repeat CT showed no acute findings  No additional stroke workup indicated at this time due to recent workup  Recommend TED hose above the knee for hypotension management.   Eliquis for VTE prophylaxis Diet Heart Room service appropriate? Yes; Fluid consistency: Thin  Eliquis (apixaban) daily prior to admission, now on Eliquis (apixaban) daily. Continue eliquis for stroke prevention.  Patient counseled to be compliant with his antithrombotic medications  Ongoing aggressive stroke risk factor management  Therapy recommendations:  HH PT and OT  Disposition:  Likely home (Lives with spouse)  Pt has been following with Dr. Rexene Alberts as outpt.  Hx of suspect stroke in 12/2015  No MRI due to pacer  Repeat CT showed no acute abnormalities  Suspected left brain  stroke  Put on eliquis due to hx of afib  Atrial Fibrillation  Home anticoagulation:  Eliquis (apixaban) daily continued in the hospital  Cardiology consulted and pacer interrogated showed no afib dating back to 01/2015 and AF burden < 1%  Due to concern of paroxysmal afib and suspect stroke in 12/2015, would recommend to continue eliquis at discharge  Hyperlipidemia  Home meds:  pravachol 40, resumed in hospital  Continue statin at discharge  Other Stroke Risk Factors  Advanced age   CAD s/p CABG  Ischemic cardiomyopathy  Combined congestive systolic and diastolic heart failure  Other Active Problems  Baseline progressive demenita  Insominia, RLS, Anxiety   BPH  Sustained ventricular tachycardia w/ Pacer   Chronic R hand weakness per family. Hx carpal tunnel. Followed by Dr. Rollene Rotunda day # 0  Neurology will sign off. Please call with questions. Pt will follow up with Dr. Rexene Alberts at [facility] on 02/22/16. Thanks for the consult.  Rosalin Hawking, MD PhD Stroke  Neurology 01/11/2016 10:06 PM   To contact Stroke Continuity provider, please refer to [email or web address] After hours, contact General Neurology

## 2016-01-10 NOTE — ED Notes (Addendum)
Pt came in with sudden onset of R sided weakness which was resolved upon arrival. Pt also complains of dizziness (chronic) and CP for the last several weeks. Pt has hx of TIA. NIH 0 upon arrival. Neuro checks have been normal. Pt passed Stroke swallow screen. Pt given requip for restless leg.

## 2016-01-10 NOTE — ED Provider Notes (Signed)
[facility] DEPT Provider Note   CSN: [id number] Arrival date & time: 01/10/16  1225     History   Chief Complaint Chief Complaint  Patient presents with  . Weakness    HPI HOWARD BRUER is a 82 y.o. male.  HPI  82 year old male presents with acute right-sided weakness. He states he was in his arm and leg. Started around 11 AM. He was previously up and had no symptoms before this. He also had onset of dizziness and feels like his head is dizzy and spinning. The weakness has improved the patient still feels that she's a little weak. He states that he had a stroke back in the beginning of November with similar symptoms. Has also had intermittent weakness since but it typically resolves. Today was worse than the other times. Has no chest pain right now but states he's been having chest tightness frequently over the last couple weeks. No neck pain. Mild headache since the onset of weakness. He is on Eliquis.  Past Medical History:  Diagnosis Date  . Arthritis    "minor, back and sometimes knees" (12/15/2012)  . Bradycardia    AFib/SSS s/p St Jude PPM 04/12/2008  . CAD (coronary artery disease)    CABG (LIMA-LAD, SVG-RCA, SVG-OM in 1996).  07/2009 BMS to SVG-RCA. Cath in 04/2010 with patent stents   . Cardiomyopathy, ischemic 08/25/2012  . CHF (congestive heart failure) ([place])   . Chronic knee pain 12/03/2014  . 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 BNP 96.8 02/21/15 Torsemide 04/06/15 Na 142, K 4.6, Bun 16, creat 0.89 04/20/14 BNP 111.7, Na 142, K 4.6, Bun 16, creat 0.9   . Coronary artery disease with hx of myocardial infarct w/o hx of CABG 12/30/2014   CABG (LIMA-LAD, SVG-RCA, SVG-OM in 1996).  07/2009 BMS to SVG-RCA. Cath in 04/2010 with patent stents   . Depression with anxiety 02/02/2015   02/21/15 Hgb A1c 5.8 03/10/15 MMSE 30/30   . Dizziness, after diuretic asscoiated with hypotension and responded to fluid bolus 06/05/2011   04/28/15 US  carotid R+L normal bilateral arterial velocities.    Marland Kitchen Dyspnea 08/11/2014   Followed in Pulmonary clinic/ [facility]/ [place]  - 08/11/2014  Walked RA x 1 laps @ 185 ft each stopped due to fatigue/off balance/ slow pace/  no sob or desat  - PFT's  09/26/2014  FEV1 2.26 (85 % ) ratio 76  p no % improvement from saba with DLCO  67 % corrects to 93 % for alv volume      Since prev study 08/04/13 minimal change lung vol or dlco    . Embolic cerebral infarction ([place]) 12/06/2015  . Exertional shortness of breath    "sometimes walking" (12/15/2012)  . GERD (gastroesophageal reflux disease)   . Gout 02/09/2015  . Heart murmur    "just told I had one today" (12/15/2012)  . Hiatal hernia   . Hyperlipidemia   . Hypertension   . Hypothyroid   . Insomnia   . Melanoma of back ([place]) 1976  . Myocardial infarction 1996; 2011   "both silent" (12/15/2012)  . Orthostatic hypotension   . Osteoporosis, senile   . Pacemaker   . RBBB   . Restless leg 02/02/2015  . Right leg weakness 12/06/2015  . [facility] spotted fever   . S/P CABG x 4   . Sick sinus syndrome ([place]) 01/31/2014  . Sustained ventricular tachycardia ([place]) 07/27/2014  . Urinary retention 12/30/2014  Patient Active Problem List   Diagnosis Date Noted  . TIA (transient ischemic attack) 01/10/2016  . Chronic anticoagulation 12/22/2015  . Right leg weakness 12/06/2015  . Embolic cerebral infarction ([place]) 12/06/2015  . Urinary tract infection 04/06/2015  . Insomnia 03/23/2015  . Gout 02/09/2015  . Depression with anxiety 02/02/2015  . Restless leg 02/02/2015  . Combined congestive systolic and diastolic heart failure ([place]) 02/02/2015  . Urinary frequency 02/02/2015  . Pressure ulcer 01/01/2015  . Urinary retention 12/30/2014  . Coronary artery disease with hx of myocardial infarct w/o hx of CABG 12/30/2014  . Expressive aphasia 12/30/2014  . Constipation   . Orthostatic hypotension   . Chronic knee pain 12/03/2014  . GERD  (gastroesophageal reflux disease)   . Hypertension   . Dyspnea 08/11/2014  . Hypothyroidism 08/11/2014  . VT (ventricular tachycardia) ([place]) 07/27/2014  . Sick sinus syndrome ([place]) 01/31/2014  . Hyperlipidemia 01/30/2014  . Cardiomyopathy, ischemic 08/25/2012  . Pacemaker 08/25/2012  . Dizziness, after diuretic asscoiated with hypotension and responded to fluid bolus 06/05/2011  . Bradycardia     Past Surgical History:  Procedure Laterality Date  . CARDIAC CATHETERIZATION  04/2010   LIMA to LAD patent,SVG to OM patent,no in-stnet restenosis RCA  . CATARACT EXTRACTION W/ INTRAOCULAR LENS  IMPLANT, BILATERAL Bilateral 2012  . CORONARY ANGIOPLASTY WITH STENT PLACEMENT  07/2009   bare metal stent to SVG to the RCA  . CORONARY ARTERY BYPASS GRAFT  1996   LIMA to LAD,SVG to RCA & SVG to OM  . INSERT / REPLACE / REMOVE PACEMAKER  2010  . MELANOMA EXCISION  05/1974 X2   "taken off my back" (12/15/2012)  . NM MYOVIEW LTD  06/2011   low risk  . TONSILLECTOMY  1938  . TRANSURETHRAL RESECTION OF PROSTATE  1986  . US ECHOCARDIOGRAPHY  07/11/2009   EF 45-50%       Home Medications    Prior to Admission medications   Medication Sig Start Date End Date Taking? Authorizing Provider  amiodarone (PACERONE) 200 MG tablet Take 200 mg by mouth daily.    Historical Provider, MD  apixaban (ELIQUIS) 5 MG TABS tablet Take 1 tablet (5 mg total) by mouth 2 (two) times daily. 01/05/16   Gildardo Cranker, DO  bisacodyl (DULCOLAX) 5 MG EC tablet Take 5 mg by mouth daily as needed for moderate constipation.    Historical Provider, MD  Cholecalciferol (VITAMIN D3) 5000 UNITS CAPS Take 1 capsule by mouth every Monday. Reported on 02/22/2015    Historical Provider, MD  clobetasol (TEMOVATE) 0.05 % external solution Apply 1 application topically. Apply once or twice daily on scaple    Historical Provider, MD  clonazePAM (KLONOPIN) 0.5 MG tablet Take one tablet by mouth every 4 hours as needed for anxiety/restless legs  01/05/16   Gildardo Cranker, DO  levothyroxine (SYNTHROID, LEVOTHROID) 137 MCG tablet Take 1 tablet (137 mcg total) by mouth daily before breakfast. 01/05/16   Gildardo Cranker, DO  lubiprostone (AMITIZA) 8 MCG capsule Take 24 mcg by mouth daily with breakfast.     Historical Provider, MD  midodrine (PROAMATINE) 5 MG tablet Take 1 tablet (5 mg total) by mouth 3 (three) times daily with meals. Take third dose at least 6 hours before bedtime 01/05/16   Gildardo Cranker, DO  pantoprazole (PROTONIX) 40 MG tablet Take one tablet by mouth at bedtime to reduce stomach acid 01/05/16   Gildardo Cranker, DO  pravastatin (PRAVACHOL) 40 MG tablet Take 1 tablet (40  mg total) by mouth daily. To lower cholesterol 10/23/15   Estill Dooms, MD  rOPINIRole (REQUIP) 0.5 MG tablet Take 1 tablet (0.5 mg total) by mouth 2 (two) times daily. 01/05/16   Gildardo Cranker, DO  SENNA PO Take by mouth. Take 2 tablets daily for constipation    Historical Provider, MD  tamsulosin (FLOMAX) 0.4 MG CAPS capsule Take one capsule by mouth once daily to help improve urinary frequency 01/05/16   Gildardo Cranker, DO  torsemide (DEMADEX) 20 MG tablet Take 1 tablets daily if you gain 3 or more pounds weight. Stop when weight returns to normal.. 01/05/16   Gildardo Cranker, DO  traZODone (DESYREL) 50 MG tablet TAKE 1 TABLET AT BEDTIME AS NEEDED. 01/10/16   Estill Dooms, MD    Family History Family History  Problem Relation Age of Onset  . Coronary artery disease Mother   . Diabetes Mother   . Heart disease Mother   . Coronary artery disease Father   . Diabetes Father   . Lung cancer Father   . Heart disease Father   . Anuerysm Son   . Heart disease Brother     Social History Social History  Substance Use Topics  . Smoking status: Never Smoker  . Smokeless tobacco: Never Used  . Alcohol use No     Allergies   Crestor [rosuvastatin calcium]; Altace [ramipril]; Bactrim [sulfamethoxazole-trimethoprim]; Penicillins; and Sulfa antibiotics   Review  of Systems Review of Systems  Respiratory: Negative for shortness of breath.   Cardiovascular: Negative for chest pain.  Gastrointestinal: Negative for nausea and vomiting.  Musculoskeletal: Negative for neck pain.  Neurological: Positive for dizziness, weakness and headaches.  All other systems reviewed and are negative.    Physical Exam Updated Vital Signs BP 135/76 (BP Location: Left Arm)   Pulse 70   Temp 97.4 F (36.3 C) (Oral)   Resp 19   SpO2 93%   Physical Exam  Constitutional: He is oriented to person, place, and time. He appears well-developed and well-nourished. No distress.  HENT:  Head: Normocephalic and atraumatic.  Right Ear: External ear normal.  Left Ear: External ear normal.  Nose: Nose normal.  Eyes: EOM are normal. Pupils are equal, round, and reactive to light. Right eye exhibits no discharge. Left eye exhibits no discharge.  Neck: Neck supple.  Cardiovascular: Normal rate, regular rhythm and normal heart sounds.   Pulmonary/Chest: Effort normal and breath sounds normal.  Abdominal: Soft. There is no tenderness.  Musculoskeletal: He exhibits no edema.  Neurological: He is alert and oriented to person, place, and time.  CN 3-12 grossly intact. 5/5 strength in all 4 extremities. Grossly normal sensation. Normal finger to nose. No pronator drift. Able to hold both legs off stretcher equally. Occasional tremors/restless leg in LLE  Skin: Skin is warm and dry. He is not diaphoretic.  Nursing note and vitals reviewed.    ED Treatments / Results  Labs (all labs ordered are listed, but only abnormal results are displayed) Labs Reviewed  COMPREHENSIVE METABOLIC PANEL - Abnormal; Notable for the following:       Result Value   Glucose, Bld 106 (*)    Total Protein 6.4 (*)    All other components within normal limits  URINALYSIS, ROUTINE W REFLEX MICROSCOPIC - Abnormal; Notable for the following:    Color, Urine STRAW (*)    All other components within  normal limits  I-STAT CHEM 8, ED - Abnormal; Notable for the following:  Calcium, Ion 1.14 (*)    All other components within normal limits  PROTIME-INR  APTT  CBC  DIFFERENTIAL  RAPID URINE DRUG SCREEN, HOSP PERFORMED  ETHANOL  I-STAT TROPOININ, ED    EKG  EKG Interpretation  Date/Time:  Wednesday January 10 2016 12:38:18 EST Ventricular Rate:  89 PR Interval:    QRS Duration: 238 QT Interval:  534 QTC Calculation: 650 R Axis:   -66 Text Interpretation:  AV dual-paced rhythm no significant change since Dec 08 2015 Confirmed by Regenia Skeeter MD, Omran Keelin [phone]) on 01/10/2016 12:50:10 PM       Radiology Ct Head Wo Contrast  Result Date: 01/10/2016 CLINICAL DATA:  Sudden onset of right-sided weakness. History of chronic episodes of dizziness. EXAM: CT HEAD WITHOUT CONTRAST TECHNIQUE: Contiguous axial images were obtained from the base of the skull through the vertex without intravenous contrast. COMPARISON:  12/08/2015 FINDINGS: Brain: No evidence of acute infarction, hemorrhage, hydrocephalus, extra-axial collection or mass lesion/mass effect. There is ventricular and sulcal enlargement reflecting moderate atrophy. Vascular: No hyperdense vessel or unexpected calcification. Skull: Normal. Negative for fracture or focal lesion. Sinuses/Orbits: Visualize globes orbits are unremarkable. Visualized sinuses and mastoid air cells are clear. Other: None. IMPRESSION: 1. No acute intracranial abnormalities. 2. Moderate atrophy. Electronically Signed   By: Lajean Manes M.D.   On: 01/10/2016 13:20    Procedures Procedures (including critical care time)  Medications Ordered in ED Medications  rOPINIRole (REQUIP) tablet 0.5 mg (not administered)     Initial Impression / Assessment and Plan / ED Course  I have reviewed the triage vital signs and the nursing notes.  Pertinent labs & imaging results that were available during my care of the patient were reviewed by me and considered in my  medical decision making (see chart for details).  Clinical Course as of Jan 10 1527  Wed Jan 10, 2016  1247 Patient still feels like he's weaker on right side but on my exam strength is essentially same. Given rapidly improving symptoms and equivocal exam, will not call code CVA. Stroke workup.  [SG]  23 Dr. Marily Memos to admit, tele obs  [SG]    Clinical Course User Index [SG] Sherwood Gambler, MD    Symptoms of essentially resolved. Likely TIA. Recently had the workup but I think observation for serial troponins given his recent chest tightness and monitoring is warranted. Telemetry monitoring. Neurology has been consulted as well.  Final Clinical Impressions(s) / ED Diagnoses   Final diagnoses:  Transient cerebral ischemia, unspecified type  Chest tightness    New Prescriptions New Prescriptions   No medications on file     Sherwood Gambler, MD 01/10/16 1529

## 2016-01-10 NOTE — ED Triage Notes (Addendum)
To ED via [facility] from home with c/o sudden onset weakness in right side -- resolved on arrival. Hx of TIA 3 weeks ago. Chest pain intermittent x 2 weeks .  Pt has had episodes of dizziness intermittently for years. Has gotten progressively worse.  Hx of restless legs-- left leg having spasms.

## 2016-01-10 NOTE — ED Notes (Signed)
Admitting at bedside.  SWAT nurse on standby to transport pt.

## 2016-01-11 ENCOUNTER — Observation Stay (HOSPITAL_COMMUNITY): Payer: Medicare Other

## 2016-01-11 ENCOUNTER — Encounter (HOSPITAL_COMMUNITY): Payer: Self-pay | Admitting: General Practice

## 2016-01-11 DIAGNOSIS — I480 Paroxysmal atrial fibrillation: Secondary | ICD-10-CM

## 2016-01-11 DIAGNOSIS — Z950 Presence of cardiac pacemaker: Secondary | ICD-10-CM | POA: Diagnosis not present

## 2016-01-11 DIAGNOSIS — R2990 Unspecified symptoms and signs involving the nervous system: Secondary | ICD-10-CM | POA: Diagnosis not present

## 2016-01-11 DIAGNOSIS — K210 Gastro-esophageal reflux disease with esophagitis: Secondary | ICD-10-CM

## 2016-01-11 DIAGNOSIS — I482 Chronic atrial fibrillation: Secondary | ICD-10-CM | POA: Diagnosis not present

## 2016-01-11 DIAGNOSIS — Z951 Presence of aortocoronary bypass graft: Secondary | ICD-10-CM

## 2016-01-11 DIAGNOSIS — I9589 Other hypotension: Secondary | ICD-10-CM

## 2016-01-11 DIAGNOSIS — I951 Orthostatic hypotension: Secondary | ICD-10-CM | POA: Diagnosis not present

## 2016-01-11 LAB — CBC
HCT: 42.2 % (ref 39.0–52.0)
Hemoglobin: 13.9 g/dL (ref 13.0–17.0)
MCH: 32 pg (ref 26.0–34.0)
MCHC: 32.9 g/dL (ref 30.0–36.0)
MCV: 97.2 fL (ref 78.0–100.0)
Platelets: 188 10*3/uL (ref 150–400)
RBC: 4.34 MIL/uL (ref 4.22–5.81)
RDW: 14.5 % (ref 11.5–15.5)
WBC: 6.4 10*3/uL (ref 4.0–10.5)

## 2016-01-11 LAB — URINALYSIS, ROUTINE W REFLEX MICROSCOPIC
Bilirubin Urine: NEGATIVE
Glucose, UA: NEGATIVE mg/dL
Hgb urine dipstick: NEGATIVE
Ketones, ur: NEGATIVE mg/dL
Leukocytes, UA: NEGATIVE
Nitrite: NEGATIVE
Protein, ur: NEGATIVE mg/dL
Specific Gravity, Urine: 1.012 (ref 1.005–1.030)
pH: 7 (ref 5.0–8.0)

## 2016-01-11 LAB — BASIC METABOLIC PANEL
Anion gap: 7 (ref 5–15)
BUN: 13 mg/dL (ref 6–20)
CO2: 27 mmol/L (ref 22–32)
Calcium: 8.8 mg/dL — ABNORMAL LOW (ref 8.9–10.3)
Chloride: 106 mmol/L (ref 101–111)
Creatinine, Ser: 0.79 mg/dL (ref 0.61–1.24)
GFR calc Af Amer: >60 mL/min (ref >60)
GFR calc non Af Amer: >60 mL/min (ref >60)
Glucose, Bld: 97 mg/dL (ref 65–99)
Potassium: 4.1 mmol/L (ref 3.5–5.1)
Sodium: 140 mmol/L (ref 135–145)

## 2016-01-11 LAB — TROPONIN I
Troponin I: <0.03 ng/mL (ref <0.03)
Troponin I: <0.03 ng/mL (ref <0.03)

## 2016-01-11 MED ORDER — CLONAZEPAM 0.5 MG PO TABS: 0.5000 mg | ORAL_TABLET | Freq: Three times a day (TID) | ORAL | 0 refills | Status: DC | PRN

## 2016-01-11 MED ORDER — POLYETHYLENE GLYCOL 3350 17 G PO PACK
17.0000 g | PACK | Freq: Every day | ORAL | Status: DC
  Administered 2016-01-11 – 2016-01-12 (×2): 17 g via ORAL
  Filled 2016-01-11 (×2): qty 1

## 2016-01-11 MED ORDER — ROPINIROLE HCL 1 MG PO TABS
0.5000 mg | ORAL_TABLET | Freq: Two times a day (BID) | ORAL | Status: DC
  Administered 2016-01-11 (×2): 0.5 mg via ORAL
  Filled 2016-01-11 (×2): qty 1

## 2016-01-11 MED ORDER — SODIUM CHLORIDE 0.9 % IV BOLUS (SEPSIS)
500.0000 mL | Freq: Once | INTRAVENOUS | Status: AC
  Administered 2016-01-11: 500 mL via INTRAVENOUS

## 2016-01-11 NOTE — Care Management Note (Signed)
Case Management Note  Patient Details  Name: [name] MRN: [id number] Date of Birth: [birth year]  Subjective/Objective:                 Patient presented with right-sided weakness and chest pain. Lives at home with spouse.  Will follow for discharge needs pending PT/OT evals and physician orders.   Action/Plan:   Expected Discharge Date:                  Expected Discharge Plan:     In-House Referral:     Discharge planning Services     Post Acute Care Choice:    Choice offered to:     DME Arranged:    DME Agency:     HH Arranged:    HH Agency:     Status of Service:     If discussed at [employer] of Stay Meetings, dates discussed:    Additional Comments:  Rolm Baptise, RN 01/11/2016, 12:08 PM

## 2016-01-11 NOTE — Progress Notes (Signed)
[facility] PROGRESS NOTE  Adam [name] [id number] DOB: April 20, 1926 DOA: 01/10/2016   PCP: Jeanmarie Hubert, MD     Assessment/Plan: Active Problems:   Pacemaker   GERD (gastroesophageal reflux disease)   Urinary retention   Insomnia   Chronic a-fib (HCC)   Stroke-like symptoms   Atypical chest pain   Hypotension   Hx of CABG   82 y/o complicated male followed by Dr Sallyanne Kuster and Dr Nyoka Cowden with a history of CAD, ICM, orhtostatic hypotension, SSS-s/p PTVDP 2010, mild dementia, and VT on Amiodarone.Dr Nyoka Cowden has recently decreased his Amiodarone secondary to "weak spells".  Decreasing his Amiodarone had been discussed with Dr Sallyanne Kuster in the past and he was not opposed to this. The pt was admitted to [facility] 11/1-11/3/17 with Rt leg weakness. CT was negative, MRI unable to be done secondary to pacemaker. There was some concern it may be secondary to L spine DJD as well. Neurology recommended Eliquis for presumed Lt brain stoke. Patient presented with right-sided weakness on 12/6. 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  Cardiomyopathy, ischemic Last EF 30-44% by Myoview March 2017  Orthostatic hypotension Patient continues to be orthostatic, will give fluids and may need to increase midodrine   Coronary artery disease with hx of myocardial infarct w/o hx of CABG CABG (LIMA-LAD, SVG-RCA,  SVG-OM in 1996).  07/2009 BMS to SVG-RCA. Cath in 04/2010 with patent stents, Myoview march 2017- scar Cardiac enzymes negative    Pacemaker [employer] RF [id number] dual-chamber permanent pacemaker implanted March 2010 for sinus node dysfunction. 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   DVT prophylaxsis eliquis   Code Status:  Full code    Family Communication: Discussed in detail with the patient/ wife , all imaging results, lab results explained to the patient   Disposition Plan:  1-2 days     Consultants:  Neurology  cardiology  Procedures:   None  Antibiotics: Anti-infectives    None         HPI/Subjective: Orthostatic hypotension,  Objective: Vitals:   01/10/16 2315 01/11/16 0114 01/11/16 0633 01/11/16 0957  BP: 105/66 104/70 132/66 (!) 71/46  Pulse: 67 70 69 70  Resp: 17 18 20 17   Temp: 98.1 F (36.7 C) 97.8 F (36.6 C) 98 F (36.7 C) 98 F (36.7 C)  TempSrc: Oral Oral Oral Oral  SpO2: 95% 96% 96% 95%    Intake/Output Summary (Last 24 hours) at 01/11/16 1013 Last data filed at 01/11/16 0810  Gross per 24 hour  Intake              480 ml  Output              275 ml  Net  205 ml    Exam:  Examination:  General exam: Appears calm and comfortable  Respiratory system: Clear to auscultation. Respiratory effort normal. Cardiovascular system: S1 & S2 heard, RRR. No JVD, murmurs, rubs, gallops or clicks. No pedal edema. Gastrointestinal system: Abdomen is nondistended, soft and nontender. No organomegaly or masses felt. Normal bowel sounds heard. Central nervous system: Alert and oriented. No focal neurological deficits. Extremities: Symmetric 5 x 5 power. Skin: No rashes, lesions or ulcers Psychiatry: Judgement and insight appear normal. Mood & affect appropriate.      Data Reviewed: I have personally reviewed following labs and imaging studies  Micro Results No results found for this or any previous visit (from the past 240 hour(s)).  Radiology Reports Ct Head Wo Contrast  Result Date: 01/11/2016 CLINICAL DATA:  Headache, dizziness, and blurred vision occurring last night. EXAM: CT HEAD WITHOUT CONTRAST TECHNIQUE: Contiguous axial images were obtained from the base of the skull through the vertex without intravenous contrast. COMPARISON:  CT head 01/10/2016. FINDINGS: Brain: No evidence for acute infarction, hemorrhage, mass lesion, or extra-axial fluid. Moderate cerebral and cerebellar atrophy. Hydrocephalus ex vacuo. Hypoattenuation of white matter consistent with chronic microvascular ischemic change. Vascular: Extensive vascular calcification, not unexpected for age. No signs of emergent large vessel occlusion. Skull: Normal. Negative for fracture or focal lesion. Sinuses/Orbits: No acute finding. Other: Moderate calcific pannus surrounds the dens. Compared with priors, no change. IMPRESSION: Stable exam. Atrophy and small vessel disease. No acute intracranial findings. Electronically Signed   By: Staci Righter M.D.   On: 01/11/2016 08:48   Ct Head Wo Contrast  Result Date: 01/10/2016 CLINICAL DATA:  Sudden onset of right-sided weakness. History of chronic episodes of dizziness. EXAM: CT HEAD WITHOUT CONTRAST TECHNIQUE: Contiguous axial images were obtained from the base of the skull through the vertex without intravenous contrast. COMPARISON:  12/08/2015 FINDINGS: Brain: No evidence of acute infarction, hemorrhage, hydrocephalus, extra-axial collection or mass lesion/mass effect. There is ventricular and sulcal enlargement reflecting moderate atrophy. Vascular: No hyperdense vessel or unexpected calcification. Skull: Normal. Negative for fracture or focal lesion. Sinuses/Orbits: Visualize globes orbits are unremarkable. Visualized sinuses and mastoid air  cells are clear. Other: None. IMPRESSION: 1. No acute intracranial abnormalities. 2. Moderate atrophy. Electronically Signed   By: Lajean Manes M.D.   On: 01/10/2016 13:20     CBC  Recent Labs Lab 01/10/16 1400 01/10/16 1414 01/11/16 0537  WBC 6.0  --  6.4  HGB 14.1 14.3 13.9  HCT 42.7 42.0 42.2  PLT 190  --  188  MCV 97.7  --  97.2  MCH 32.3  --  32.0  MCHC 33.0  --  32.9  RDW 14.4  --  14.5  LYMPHSABS 1.3  --   --   MONOABS 0.5  --   --   EOSABS 0.1  --   --   BASOSABS 0.0  --   --     Chemistries   Recent Labs Lab 01/10/16 1400 01/10/16 1414 01/11/16 0537  NA 140 142 140  K 4.0 3.9 4.1  CL 106 102 106  CO2 26  --  27  GLUCOSE 106* 99 97  BUN 14 15 13   CREATININE 0.84 0.80 0.79  CALCIUM 9.0  --  8.8*  AST 25  --   --   ALT 22  --   --   ALKPHOS 68  --   --   BILITOT 0.4  --   --    ------------------------------------------------------------------------------------------------------------------ CrCl  cannot be calculated (Unknown ideal weight.). ------------------------------------------------------------------------------------------------------------------ No results for input(s): HGBA1C in the last 72 hours. ------------------------------------------------------------------------------------------------------------------ No results for input(s): CHOL, HDL, LDLCALC, TRIG, CHOLHDL, LDLDIRECT in the last 72 hours. ------------------------------------------------------------------------------------------------------------------ No results for input(s): TSH, T4TOTAL, T3FREE, THYROIDAB in the last 72 hours.  Invalid input(s): FREET3 ------------------------------------------------------------------------------------------------------------------ No results for input(s): VITAMINB12, FOLATE, FERRITIN, TIBC, IRON, RETICCTPCT in the last 72 hours.  Coagulation profile  Recent Labs Lab 01/10/16 1400  INR 1.14    No results for input(s): DDIMER in the last 72  hours.  Cardiac Enzymes  Recent Labs Lab 01/10/16 1904 01/10/16 2342 01/11/16 0537  TROPONINI <0.03 <0.03 <0.03   ------------------------------------------------------------------------------------------------------------------ Invalid input(s): POCBNP   CBG: No results for input(s): GLUCAP in the last 168 hours.     Studies: Ct Head Wo Contrast  Result Date: 01/11/2016 CLINICAL DATA:  Headache, dizziness, and blurred vision occurring last night. EXAM: CT HEAD WITHOUT CONTRAST TECHNIQUE: Contiguous axial images were obtained from the base of the skull through the vertex without intravenous contrast. COMPARISON:  CT head 01/10/2016. FINDINGS: Brain: No evidence for acute infarction, hemorrhage, mass lesion, or extra-axial fluid. Moderate cerebral and cerebellar atrophy. Hydrocephalus ex vacuo. Hypoattenuation of white matter consistent with chronic microvascular ischemic change. Vascular: Extensive vascular calcification, not unexpected for age. No signs of emergent large vessel occlusion. Skull: Normal. Negative for fracture or focal lesion. Sinuses/Orbits: No acute finding. Other: Moderate calcific pannus surrounds the dens. Compared with priors, no change. IMPRESSION: Stable exam. Atrophy and small vessel disease. No acute intracranial findings. Electronically Signed   By: Staci Righter M.D.   On: 01/11/2016 08:48   Ct Head Wo Contrast  Result Date: 01/10/2016 CLINICAL DATA:  Sudden onset of right-sided weakness. History of chronic episodes of dizziness. EXAM: CT HEAD WITHOUT CONTRAST TECHNIQUE: Contiguous axial images were obtained from the base of the skull through the vertex without intravenous contrast. COMPARISON:  12/08/2015 FINDINGS: Brain: No evidence of acute infarction, hemorrhage, hydrocephalus, extra-axial collection or mass lesion/mass effect. There is ventricular and sulcal enlargement reflecting moderate atrophy. Vascular: No hyperdense vessel or unexpected  calcification. Skull: Normal. Negative for fracture or focal lesion. Sinuses/Orbits: Visualize globes orbits are unremarkable. Visualized sinuses and mastoid air cells are clear. Other: None. IMPRESSION: 1. No acute intracranial abnormalities. 2. Moderate atrophy. Electronically Signed   By: Lajean Manes M.D.   On: 01/10/2016 13:20      Lab Results  Component Value Date   HGBA1C 5.6 12/07/2015   HGBA1C 5.9 04/20/2015   HGBA1C 5.8 02/16/2015   Lab Results  Component Value Date   LDLCALC 23 12/07/2015   CREATININE 0.79 01/11/2016       Scheduled Meds: .  stroke: mapping our early stages of recovery book   Does not apply Once  . amiodarone  200 mg Oral Daily  . apixaban  5 mg Oral BID  . levothyroxine  137 mcg Oral QAC breakfast  . lubiprostone  24 mcg Oral Q breakfast  . midodrine  5 mg Oral TID WC  . pantoprazole  40 mg Oral Daily  . pravastatin  40 mg Oral q1800  . senna  1 tablet Oral Daily  . sodium chloride  500 mL Intravenous Once  . sodium chloride flush  3 mL Intravenous Q12H  . sodium chloride flush  3 mL Intravenous Q12H  . tamsulosin  0.4 mg Oral Daily   Continuous Infusions:   LOS: 0 days    Time spent: >30 MINS    Jacquan Savas  [facility] Pager [phone]. If 7PM-7AM, please contact night-coverage at [email or web address], password [facility] 01/11/2016, 10:13 AM  LOS: 0 days

## 2016-01-11 NOTE — Evaluation (Signed)
Physical Therapy Evaluation Patient Details Name: Adam [name] MRN: [id number] DOB: 05/16/1926 Today's Date: 01/11/2016   History of Present Illness  82 year old male presents with acute right-sided weakness. He states he was in his arm and leg. He also had onset of dizziness and feels like his head is dizzy and spinning.  PMH: DDD, congestive cardiomyopathy, heartv failure, CAD, HTN, postural hypotension, intermittent weakness with cerebral and cerebellar atrophy, GERD, RBBB  Clinical Impression  Pt admitted with above diagnosis. Pt currently with functional limitations due to the deficits listed below (see PT Problem List). Pt will benefit from skilled PT to increase their independence and safety with mobility to allow discharge to the venue listed below.  Pt's eval limited by symptomatic orthostatic hypotension.  Anticipate good progress once medically more stable.     Follow Up Recommendations Home health PT;Supervision for mobility/OOB    Equipment Recommendations  None recommended by PT    Recommendations for Other Services       Precautions / Restrictions Precautions Precautions: Fall Precaution Comments: watch BP Restrictions Weight Bearing Restrictions: No      Mobility  Bed Mobility Overal bed mobility: Needs Assistance Bed Mobility: Supine to Sit     Supine to sit: Min assist Sit to supine: Supervision   General bed mobility comments: MIN A to get to EOB with use of rail. Pt able to scoot to EOB  Transfers Overall transfer level: Needs assistance Equipment used: Rolling walker (2 wheeled) Transfers: Sit to/from [facility] Sit to Stand: Min assist Stand pivot transfers: Min assist       General transfer comment: MIN A to power up. Stood for [employer]  Ambulation/Gait             General Gait Details: deferred due to hypotension  Stairs            Wheelchair Mobility    Modified Rankin (Stroke Patients Only) Modified  Rankin (Stroke Patients Only) Pre-Morbid Rankin Score: Moderate disability Modified Rankin: Moderately severe disability     Balance Overall balance assessment: Needs assistance Sitting-balance support: Feet supported Sitting balance-Leahy Scale: Poor Sitting balance - Comments: unsteady for dynamic activities     Standing balance-Leahy Scale: Poor                               Pertinent Vitals/Pain Pain Assessment: 0-10 Pain Score: 1  Faces Pain Scale: Hurts a little bit Pain Location: back Pain Descriptors / Indicators: Sore Pain Intervention(s): Monitored during session    Home Living Family/patient expects to be discharged to:: Private residence Living Arrangements: Spouse/significant other Available Help at Discharge: Family;Available 24 hours/day Type of Home: Independent living facility Home Access: Level entry     Home Layout: One level Home Equipment: Walker - 4 wheels;Grab bars - toilet;Grab bars - tub/shower Additional Comments: Lives at [facility] in [place] with his wife.    Prior Function Level of Independence: Independent with assistive device(s)         Comments: used rollator, assisted for socks intermittently due to unsteadiness     Hand Dominance   Dominant Hand: Right    Extremity/Trunk Assessment   Upper Extremity Assessment: Defer to OT evaluation RUE Deficits / Details: Decreased grip strength and coordination x 2 years.   RUE Sensation: decreased light touch     Lower Extremity Assessment: Overall WFL for tasks assessed;Generalized weakness      Cervical /  Trunk Assessment: Kyphotic  Communication   Communication: No difficulties  Cognition Arousal/Alertness: Awake/alert Behavior During Therapy: WFL for tasks assessed/performed Overall Cognitive Status: Within Functional Limits for tasks assessed                      General Comments General comments (skin integrity, edema, etc.): wife  present    Exercises     Assessment/Plan    PT Assessment Patient needs continued PT services  PT Problem List Decreased strength;Decreased activity tolerance;Decreased balance;Cardiopulmonary status limiting activity;Decreased mobility          PT Treatment Interventions DME instruction;Gait training;Functional mobility training;Therapeutic activities;Therapeutic exercise;Balance training    PT Goals (Current goals can be found in the Care Plan section)  Acute Rehab PT Goals Patient Stated Goal: return home PT Goal Formulation: With patient/family Time For Goal Achievement: 01/25/16 Potential to Achieve Goals: Good    Frequency Min 3X/week   Barriers to discharge        Co-evaluation PT/OT/SLP Co-Evaluation/Treatment: Yes Reason for Co-Treatment: Other (comment);For patient/therapist safety (pt orthostatic) PT goals addressed during session: Mobility/safety with mobility OT goals addressed during session: ADL's and self-care       End of Session Equipment Utilized During Treatment: Gait belt Activity Tolerance: Treatment limited secondary to medical complications (Comment) (orthostatic hypotension) Patient left: in bed;with call bell/phone within reach;with family/visitor present;with bed alarm set Nurse Communication: Mobility status;Other (comment) (BP)    Functional Assessment Tool Used: clinical judgement and objective findings Functional Limitation: Changing and maintaining body position Changing and Maintaining Body Position Current Status [id number]): At least 1 percent but less than 20 percent impaired, limited or restricted Changing and Maintaining Body Position Goal Status [id number]): At least 1 percent but less than 20 percent impaired, limited or restricted    Time: 1114-1140 PT Time Calculation (min) (ACUTE ONLY): 26 min   Charges:   PT Evaluation $PT Eval Moderate Complexity: 1 Procedure     PT G Codes:   PT G-Codes **NOT FOR INPATIENT  CLASS** Functional Assessment Tool Used: clinical judgement and objective findings Functional Limitation: Changing and maintaining body position Changing and Maintaining Body Position Current Status [id number]): At least 1 percent but less than 20 percent impaired, limited or restricted Changing and Maintaining Body Position Goal Status [id number]): At least 1 percent but less than 20 percent impaired, limited or restricted    [facility] LUBECK 01/11/2016, 12:24 PM

## 2016-01-11 NOTE — Progress Notes (Signed)
Pt and his wife reside at [place]. They would like to use either [facility] or [facility] for [facility] services if ordered at discharge. CM following.

## 2016-01-11 NOTE — Care Management Obs Status (Signed)
[place] NOTIFICATION   Patient Details  Name: DEMARRI WEIDEMAN MRN: [id number] Date of Birth: July 14, 1926   Medicare Observation Status Notification Given:  Yes    Pollie Friar, RN 01/11/2016, 12:08 PM

## 2016-01-11 NOTE — Progress Notes (Signed)
Occupational Therapy Evaluation Patient Details Name: Adam [name] MRN: [id number] DOB: September 23, 1926 Today's Date: 01/11/2016    History of Present Illness 82 year old male presents with acute right-sided weakness. He states he was in his arm and leg. He also had onset of dizziness and feels like his head is dizzy and spinning.  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 Presents with generalized weakness, dizziness and impaired balance. Pt with baseline R hand incoordination and impaired sensation. Pt requires set up to mod assist for ADL and min assist for mobility. Will follow acutely.    Follow Up Recommendations  Home health OT;Supervision/Assistance - 24 hour    Equipment Recommendations       Recommendations for Other Services       Precautions / Restrictions Precautions Precautions: Fall Precaution Comments: watch BP Restrictions Weight Bearing Restrictions: No      Mobility Bed Mobility Overal bed mobility: Needs Assistance Bed Mobility: Supine to Sit;Sit to Supine     Supine to sit: Min assist Sit to supine: Supervision   General bed mobility comments: min assist to raise trunk, cues for technique, supervision for safety with return to supine, pt moves slowly  Transfers Overall transfer level: Needs assistance Equipment used: Rolling walker (2 wheeled) Transfers: Sit to/from [facility] Sit to Stand: Min assist Stand pivot transfers: Min assist       General transfer comment: MIN A to power up. Stood for [employer] Overall balance assessment: Needs assistance Sitting-balance support: Feet supported Sitting balance-Leahy Scale: Poor Sitting balance - Comments: unsteady for dynamic activities     Standing balance-Leahy Scale:  Poor                              ADL Overall ADL's : Needs assistance/impaired Eating/Feeding: Set up;Sitting Eating/Feeding Details (indicate cue type and reason): assist to open packages and cut food, pt has red foam build up from prior hospitalization Grooming: Sitting;Supervision/safety   Upper Body Bathing: Supervision/ safety;Sitting   Lower Body Bathing: Moderate assistance;Sit to/from stand   Upper Body Dressing : Minimal assistance;Sitting   Lower Body Dressing: Moderate assistance;Sit to/from stand   Toilet Transfer: Minimal assistance;BSC;RW;Stand-pivot   Toileting- Water quality scientist and Hygiene: Sit to/from stand;Moderate assistance       Functional mobility during ADLs:  (deferred due to hypotension) General ADL Comments: Pt has putty and tennis ball he uses at home for R hand strengthening.     Vision     Perception     Praxis      Pertinent Vitals/Pain Pain Assessment: Faces Pain Score: 1  Faces Pain Scale: Hurts a little bit Pain Location: back Pain Descriptors / Indicators: Sore Pain Intervention(s): Monitored during session     Hand Dominance Right   Extremity/Trunk Assessment Upper Extremity Assessment Upper Extremity Assessment: RUE deficits/detail RUE Deficits / Details: Decreased grip strength and coordination x 2 years. RUE Sensation: decreased light touch RUE Coordination: decreased fine motor   Lower Extremity Assessment Lower Extremity Assessment: Defer to PT evaluation   Cervical / Trunk Assessment Cervical / Trunk Assessment: Kyphotic   Communication Communication Communication: No difficulties   Cognition Arousal/Alertness: Awake/alert Behavior During Therapy: WFL for tasks assessed/performed Overall Cognitive Status: Within Functional Limits for tasks assessed  General Comments       Exercises       Shoulder Instructions      Home Living Family/patient expects to be  discharged to:: Private residence Living Arrangements: Spouse/significant other Available Help at Discharge: Family;Available 24 hours/day Type of Home: Independent living facility Home Access: Level entry     Home Layout: One level     Bathroom Shower/Tub: Occupational psychologist: Handicapped height Bathroom Accessibility: Yes How Accessible: Accessible via walker Home Equipment: Walker - 4 wheels;Grab bars - toilet;Grab bars - tub/shower   Additional Comments: Lives at [facility] in [place] with his wife.  Lives With: Spouse    Prior Functioning/Environment Level of Independence: Independent with assistive device(s)        Comments: used rollator, assisted for socks intermittently due to unsteadiness        OT Problem List: Decreased strength;Decreased activity tolerance;Decreased coordination;Impaired balance (sitting and/or standing);Decreased knowledge of use of DME or AE;Impaired UE functional use;Cardiopulmonary status limiting activity   OT Treatment/Interventions: Self-care/ADL training;Energy conservation;DME and/or AE instruction;Therapeutic activities;Patient/family education;Balance training    OT Goals(Current goals can be found in the care plan section) Acute Rehab OT Goals Patient Stated Goal: return home OT Goal Formulation: With patient/family Time For Goal Achievement: 01/18/16 Potential to Achieve Goals: Good ADL Goals Pt Will Perform Grooming: with supervision;standing Pt Will Perform Lower Body Bathing: with supervision;sit to/from stand Pt Will Perform Lower Body Dressing: with supervision;sit to/from stand Pt Will Transfer to Toilet: with supervision;ambulating;bedside commode (over toilet) Pt Will Perform Toileting - Clothing Manipulation and hygiene: with supervision;sit to/from stand  OT Frequency: Min 2X/week   Barriers to D/C:            Co-evaluation PT/OT/SLP Co-Evaluation/Treatment: Yes Reason for  Co-Treatment: Other (comment);For patient/therapist safety (pt orthostatic)   OT goals addressed during session: ADL's and self-care      End of Session Equipment Utilized During Treatment: Rolling walker;Gait belt Nurse Communication: Mobility status (BPs)  Activity Tolerance: Treatment limited secondary to medical complications (Comment) (orthostatic) Patient left: in bed;with call bell/phone within reach;with family/visitor present;with bed alarm set   Time: 1110-1140 OT Time Calculation (min): 30 min Charges:  OT General Charges $OT Visit: 1 Procedure OT Evaluation $OT Eval Moderate Complexity: 1 Procedure G-Codes: OT G-codes **NOT FOR INPATIENT CLASS** Functional Assessment Tool Used: Clinical judgement Functional Limitation: Self care Self Care Current Status [id number]): At least 40 percent but less than 60 percent impaired, limited or restricted Self Care Goal Status [id number]): At least 1 percent but less than 20 percent impaired, limited or restricted  [name] 01/11/2016, 12:04 PM  [phone]

## 2016-01-11 NOTE — Progress Notes (Signed)
OT Cancellation Note  Patient Details Name: Adam [name] MRN: [id number] DOB: 11/15/1926   Cancelled Treatment:    Reason Eval/Treat Not Completed: Medical issues which prohibited therapy (Pt with hypotension. Will try back.)  [name] 01/11/2016, 10:37 AM  [phone]

## 2016-01-11 NOTE — Consult Note (Signed)
CARDIOLOGY CONSULT NOTE   Patient ID: LYNARD BOITANO MRN: [id number] DOB/AGE: 80/18/1928 80 y.o.  Admit date: 01/10/2016  Primary Physician   Jeanmarie Hubert, MD Primary Cardiologist   Dr. Sallyanne Kuster Reason for Consultation   Afib and hypotension Requesting Physician  Dr. Allyson Sabal  HPI: ROGELIO MALOY is a 82 y.o. 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   The pt was admitted to [facility] 11/1-11/3/17 with Rt leg weakness. CT was negative, MRI unable to be done secondary to pacemaker. There was some concern it may be secondary to L spine DJD as well. Neurology started on Eliquis for presumed Lt brain stoke.   The patient was seen by Kerin Ransom, [facility] 12/22/15. No evidence of CHF on exam. Per note 12/22/15, when was last time device was checked?Emmaline Life last time it was checked 12/06/14.   The patient came to ER with 2 hours hx of R upper and lower extremity weakness 01/10/16. Intermittent small episodes since admission for CVA followed by resolution. Admits to having intermittent dyspnea without chest pain. CT of head without acute finding. Seen by neurology and recommended repeat CT of head today that showed stable exam. Atrophy and small vessel disease. No acute intracranial findings.  Device check today showed no episode of afib dating back to Dec 2016. A packing 98% and V pacing 60%. AF burden < 1%.  Telemetry and EKG showed paced rhythm. Intermittent bradycardiac in 40s with pacing.   Last Myoview 04/2015 showed no ischemia. There is a sacr in the basal and mid inferior and inferolateral leads with associated  akinesis.   Past Medical History:  Diagnosis Date  . Arthritis    "minor, back and sometimes knees" (12/15/2012)  . Bradycardia    AFib/SSS s/p St Jude PPM 04/12/2008  . CAD (coronary artery disease)    CABG (LIMA-LAD, SVG-RCA, SVG-OM in 1996).  07/2009 BMS to SVG-RCA. Cath in 04/2010 with patent stents   . Cardiomyopathy, ischemic 08/25/2012  . CHF (congestive heart failure) ([place])   . Chronic knee pain 12/03/2014  . Combined congestive systolic and diastolic heart failure ([place]) 02/02/2015   Hx EF 41%. BNP 96.8 02/21/15 Torsemide 04/06/15 Na 142, K 4.6, Bun 16, creat 0.89 04/20/14 BNP 111.7, Na 142, K 4.6, Bun 16, creat 0.9   . Coronary artery disease with hx of myocardial infarct w/o hx of CABG 12/30/2014   CABG (LIMA-LAD, SVG-RCA, SVG-OM in 1996).  07/2009 BMS to SVG-RCA. Cath in 04/2010 with patent stents   . Depression with anxiety 02/02/2015   02/21/15 Hgb A1c 5.8 03/10/15 MMSE 30/30   . Dizziness, after diuretic asscoiated with hypotension and responded to fluid bolus 06/05/2011   04/28/15 US carotid R+L normal bilateral arterial velocities.    Marland Kitchen Dyspnea 08/11/2014   Followed in Pulmonary clinic/ [facility]/ [place]  - 08/11/2014  Walked RA x 1 laps @ 185 ft each stopped due to fatigue/off balance/ slow pace/  no sob or desat  - PFT's  09/26/2014  FEV1 2.26 (85 % ) ratio 76  p no % improvement from saba with DLCO  67 % corrects to 93 % for alv volume      Since  prev study 08/04/13 minimal change lung vol or dlco    . Embolic cerebral infarction ([place]) 12/06/2015  . Exertional shortness of breath    "sometimes walking" (12/15/2012)  . GERD (gastroesophageal reflux disease)   . Gout 02/09/2015  . Heart murmur    "just told I had one today" (12/15/2012)  . Hiatal hernia   . Hyperlipidemia   . Hypertension   . Hypothyroid   . Insomnia   . Melanoma of back ([place]) 1976  . Myocardial infarction 1996; 2011   "both silent" (12/15/2012)  . Orthostatic hypotension   . Osteoporosis, senile   . Pacemaker    . RBBB   . Restless leg 02/02/2015  . Right leg weakness 12/06/2015  . [facility] spotted fever   . S/P CABG x 4   . Sick sinus syndrome ([place]) 01/31/2014  . Sustained ventricular tachycardia ([place]) 07/27/2014  . Urinary retention 12/30/2014     Past Surgical History:  Procedure Laterality Date  . CARDIAC CATHETERIZATION  04/2010   LIMA to LAD patent,SVG to OM patent,no in-stnet restenosis RCA  . CATARACT EXTRACTION W/ INTRAOCULAR LENS  IMPLANT, BILATERAL Bilateral 2012  . CORONARY ANGIOPLASTY WITH STENT PLACEMENT  07/2009   bare metal stent to SVG to the RCA  . CORONARY ARTERY BYPASS GRAFT  1996   LIMA to LAD,SVG to RCA & SVG to OM  . INSERT / REPLACE / REMOVE PACEMAKER  2010  . MELANOMA EXCISION  05/1974 X2   "taken off my back" (12/15/2012)  . NM MYOVIEW LTD  06/2011   low risk  . TONSILLECTOMY  1938  . TRANSURETHRAL RESECTION OF PROSTATE  1986  . US ECHOCARDIOGRAPHY  07/11/2009   EF 45-50%    Allergies  Allergen Reactions  . Crestor [Rosuvastatin Calcium] Other (See Comments)    Unknown reaction   . Altace [Ramipril] Other (See Comments)    Cough  . Bactrim [Sulfamethoxazole-Trimethoprim] Rash  . Penicillins Rash    Has patient had a PCN reaction causing immediate rash, facial/tongue/throat swelling, SOB or lightheadedness with hypotension: No Has patient had a PCN reaction causing severe rash involving mucus membranes or skin necrosis: Yes Has patient had a PCN reaction that required hospitalization Yes Has patient had a PCN reaction occurring within the last 10 years: No If all of the above answers are "NO", then may proceed with Cephalosporin use.   . Sulfa Antibiotics Rash    I have reviewed the patient's current medications .  stroke: mapping our early stages of recovery book   Does not apply Once  . amiodarone  200 mg Oral Daily  . apixaban  5 mg Oral BID  . levothyroxine  137 mcg Oral QAC breakfast  . lubiprostone  24 mcg Oral Q breakfast  . midodrine  5 mg  Oral TID WC  . pantoprazole  40 mg Oral Daily  . polyethylene glycol  17 g Oral Daily  . pravastatin  40 mg Oral q1800  . rOPINIRole  0.5 mg Oral BID  . senna  1 tablet Oral Daily  . sodium chloride flush  3 mL Intravenous Q12H  . sodium chloride flush  3 mL Intravenous Q12H  . tamsulosin  0.4 mg Oral Daily    sodium chloride, [DISCONTINUED] acetaminophen **OR** acetaminophen, acetaminophen, clonazePAM, gi cocktail, morphine injection, ondansetron (ZOFRAN) IV, ondansetron **OR** ondansetron (ZOFRAN) IV, oxyCODONE, rOPINIRole, sodium chloride flush, torsemide, traZODone  Prior to Admission medications   Medication Sig Start Date End Date Taking? Authorizing Provider  amiodarone (PACERONE) 200 MG tablet Take 200 mg by mouth daily.   Yes Historical Provider, MD  apixaban (ELIQUIS) 5 MG TABS tablet Take 1 tablet (5 mg total) by mouth 2 (two) times daily. 01/05/16  Yes Gildardo Cranker, DO  Cholecalciferol (VITAMIN D3) 5000 UNITS CAPS Take 1 capsule by mouth every Monday. Reported on 02/22/2015   Yes Historical Provider, MD  clobetasol (TEMOVATE) 0.05 % external solution Apply 1 application topically daily as needed (for itching). TO SCALP   Yes Historical Provider, MD  clonazePAM (KLONOPIN) 0.5 MG tablet Take one tablet by mouth every 4 hours as needed for anxiety/restless legs Patient taking differently: Take 0.5 mg by mouth at bedtime.  01/05/16  Yes Gildardo Cranker, DO  levothyroxine (SYNTHROID, LEVOTHROID) 137 MCG tablet Take 1 tablet (137 mcg total) by mouth daily before breakfast. 01/05/16  Yes Gildardo Cranker, DO  lubiprostone (AMITIZA) 24 MCG capsule Take 24 mcg by mouth daily.   Yes Historical Provider, MD  midodrine (PROAMATINE) 5 MG tablet Take 1 tablet (5 mg total) by mouth 3 (three) times daily with meals. Take third dose at least 6 hours before bedtime 01/05/16  Yes Gildardo Cranker, DO  pantoprazole (PROTONIX) 40 MG tablet Take one tablet by mouth at bedtime to reduce stomach acid Patient taking  differently: Take 40 mg by mouth at bedtime. TO REDUCE STOMACH ACID 01/05/16  Yes Gildardo Cranker, DO  pravastatin (PRAVACHOL) 40 MG tablet Take 1 tablet (40 mg total) by mouth daily. To lower cholesterol 10/23/15  Yes Estill Dooms, MD  rOPINIRole (REQUIP) 0.5 MG tablet Take 1 tablet (0.5 mg total) by mouth 2 (two) times daily. Patient taking differently: Take 0.5 mg by mouth every evening. 8 PM 01/05/16  Yes Gildardo Cranker, DO  SENNA PO Take 2 tablets by mouth at bedtime as needed (for constipation).    Yes Historical Provider, MD  tamsulosin (FLOMAX) 0.4 MG CAPS capsule Take one capsule by mouth once daily to help improve urinary frequency Patient taking differently: Take 0.4 mg by mouth daily.  01/05/16  Yes Gildardo Cranker, DO  torsemide (DEMADEX) 20 MG tablet Take 1 tablets daily if you gain 3 or more pounds weight. Stop when weight returns to normal.. Patient taking differently: Take 20 mg by mouth daily as needed. If you gain 3 or more pounds/Stop when weight returns to normal 01/05/16  Yes Gildardo Cranker, DO  traZODone (DESYREL) 50 MG tablet TAKE 1 TABLET AT BEDTIME AS NEEDED. Patient taking differently: Take 50 mg by mouth at bedtime 01/10/16  Yes Estill Dooms, MD  clonazePAM (KLONOPIN) 0.5 MG tablet Take 1 tablet (0.5 mg total) by mouth 3 (three) times daily as needed (anxiety, RLS, sleep). 01/11/16   Reyne Dumas, MD     Social History   Social History  . Marital status: Married    Spouse name: N/A  . Number of children: 2  . Years of education: Masters   Occupational History  . Retired Company secretary -Pensions consultant    Social History Main Topics  . Smoking status: Never Smoker  . Smokeless tobacco: Never Used  . Alcohol use No  . Drug use: No  . Sexual activity: No   Other Topics Concern  . Not on file   Social History Narrative   Lives at [place] to [place] 01/09/15   Married - Violet   Never smoked   Alcohol none   Previously employed as Scientist, forensic for  [employer].  Diet:Low sodium   Do you drink/eat things with caffeine? No   Marital status: Married                              What year were you married?1950   Do you live in a house, apartment, assisted living, condo, trailer, etc)?    Is it one or more stories? 1   How many persons live in your home? 2   Do you have any pets in your home? No   Current or past profession: Minister, [employer]   Do you exercise?     Very Little                                                 Type & how often:    Do you have a living will?  Yes   Do you have a DNR Form? Yes   Do you have a POA/HPOA forms? Yes    Family Status  Relation Status  . Mother Deceased  . Father Deceased  . Maternal Grandmother Deceased  . Maternal Grandfather Deceased  . Paternal Grandmother Deceased  . Paternal Grandfather Deceased  . Sister Alive  . Son Deceased  . Brother Deceased  . Daughter Alive   Family History  Problem Relation Age of Onset  . Coronary artery disease Mother   . Diabetes Mother   . Heart disease Mother   . Coronary artery disease Father   . Diabetes Father   . Lung cancer Father   . Heart disease Father   . Anuerysm Son   . Heart disease Brother      ROS:  Full 14 point review of systems complete and found to be negative unless listed above.  Physical Exam: Blood pressure (!) 71/46, pulse 70, temperature 98 F (36.7 C), temperature source Oral, resp. rate 17, SpO2 95 %.  General: Well developed, well nourished, male in no acute distress Head: Eyes PERRLA, No xanthomas. Normocephalic and atraumatic, oropharynx without edema or exudate.  Lungs: Resp regular and unlabored, CTA. Heart: RRR no s3, s4, or murmurs..   Neck: No carotid bruits. No lymphadenopathy.  No JVD. Abdomen: Bowel sounds present, abdomen soft and non-tender without masses or hernias noted. Msk:  No spine or cva tenderness. No weakness, no joint deformities or effusions. Extremities: No  clubbing, cyanosis or edema. DP/PT/Radials 2+ and equal bilaterally. Neuro: Alert and oriented X 3. No focal deficits noted. Psych:  Good affect, responds appropriately Skin: No rashes or lesions noted.  Labs:   Lab Results  Component Value Date   WBC 6.4 01/11/2016   HGB 13.9 01/11/2016   HCT 42.2 01/11/2016   MCV 97.2 01/11/2016   PLT 188 01/11/2016    Recent Labs  01/10/16 1400  INR 1.14    Recent Labs Lab 01/10/16 1400  01/11/16 0537  NA 140  < > 140  K 4.0  < > 4.1  CL 106  < > 106  CO2 26  --  27  BUN 14  < > 13  CREATININE 0.84  < > 0.79  CALCIUM 9.0  --  8.8*  PROT 6.4*  --   --   BILITOT 0.4  --   --   ALKPHOS 68  --   --   ALT  22  --   --   AST 25  --   --   GLUCOSE 106*  < > 97  ALBUMIN 3.5  --   --   < > = values in this interval not displayed. Magnesium  Date Value Ref Range Status  07/26/2014 2.4 1.5 - 2.5 mg/dL Final    Recent Labs  01/10/16 1904 01/10/16 2342 01/11/16 0537  TROPONINI <0.03 <0.03 <0.03    Recent Labs  01/10/16 1412  TROPIPOC 0.01   Pro B Natriuretic peptide (BNP)  Date/Time Value Ref Range Status  08/11/2014 11:24 AM 57.0 0.0 - 100.0 pg/mL Final  07/12/2009 03:30 AM 145.0 (H) 0.0 - 100.0 pg/mL Final   Lab Results  Component Value Date   CHOL 84 12/07/2015   HDL 41 12/07/2015   LDLCALC 23 12/07/2015   TRIG 102 12/07/2015   No results found for: DDIMER Lipase  Date/Time Value Ref Range Status  12/29/2014 02:55 PM 28 11 - 51 U/L Final   TSH  Date/Time Value Ref Range Status  11/27/2015 2.58 0.41 - 5.90 uIU/mL Final  12/29/2014 03:45 PM 3.250 0.350 - 4.500 uIU/mL Final  09/06/2014 09:44 AM 3.820 0.350 - 4.500 uIU/mL Final    Comment:      Footnotes:  (1) ** Please note change in unit of measure and reference range(s). **      No results found for: VITAMINB12, FOLATE, FERRITIN, TIBC, IRON, RETICCTPCT  Echo: pending echo  Myoview 04/12/15  Nuclear stress EF: 32%.  There was no ST segment deviation  noted during stress.  Defect 1: There is a medium defect of severe severity present in the basal inferior, basal inferolateral, mid inferior and mid inferolateral location.  Findings consistent with prior myocardial infarction.  This is an intermediate risk study.  The left ventricular ejection fraction is moderately decreased (30-44%).  Akinesis of the basal and mid inferior and inferolateral walls and paradoxical septal motion.   There is a sacr in the basal and mid inferior and inferolateral leads with associated akinesis. No ischemia.  Radiology:  Ct Head Wo Contrast  Result Date: 01/11/2016 CLINICAL DATA:  Headache, dizziness, and blurred vision occurring last night. EXAM: CT HEAD WITHOUT CONTRAST TECHNIQUE: Contiguous axial images were obtained from the base of the skull through the vertex without intravenous contrast. COMPARISON:  CT head 01/10/2016. FINDINGS: Brain: No evidence for acute infarction, hemorrhage, mass lesion, or extra-axial fluid. Moderate cerebral and cerebellar atrophy. Hydrocephalus ex vacuo. Hypoattenuation of white matter consistent with chronic microvascular ischemic change. Vascular: Extensive vascular calcification, not unexpected for age. No signs of emergent large vessel occlusion. Skull: Normal. Negative for fracture or focal lesion. Sinuses/Orbits: No acute finding. Other: Moderate calcific pannus surrounds the dens. Compared with priors, no change. IMPRESSION: Stable exam. Atrophy and small vessel disease. No acute intracranial findings. Electronically Signed   By: Staci Righter M.D.   On: 01/11/2016 08:48   Ct Head Wo Contrast  Result Date: 01/10/2016 CLINICAL DATA:  Sudden onset of right-sided weakness. History of chronic episodes of dizziness. EXAM: CT HEAD WITHOUT CONTRAST TECHNIQUE: Contiguous axial images were obtained from the base of the skull through the vertex without intravenous contrast. COMPARISON:  12/08/2015 FINDINGS: Brain: No evidence of acute  infarction, hemorrhage, hydrocephalus, extra-axial collection or mass lesion/mass effect. There is ventricular and sulcal enlargement reflecting moderate atrophy. Vascular: No hyperdense vessel or unexpected calcification. Skull: Normal. Negative for fracture or focal lesion. Sinuses/Orbits: Visualize globes orbits are unremarkable. Visualized sinuses and mastoid air  cells are clear. Other: None. IMPRESSION: 1. No acute intracranial abnormalities. 2. Moderate atrophy. Electronically Signed   By: Lajean Manes M.D.   On: 01/10/2016 13:20   Dg Chest Port 1 View  Result Date: 01/11/2016 CLINICAL DATA:  Weakness.  Shortness of breath . EXAM: PORTABLE CHEST 1 VIEW COMPARISON:  12/03/2014 . FINDINGS: Cardiac pacer with lead tips in right atrium right ventricle. Prior CABG. Cardiomegaly with normal pulmonary vascularity. Low lung volumes. Interim improvement of bibasilar atelectasis. No pleural effusion or pneumothorax. IMPRESSION: 1. Cardiac pacer in stable position. Prior CABG. Stable cardiomegaly. 2.  Low lung volumes.  Interim improvement of bibasilar atelectasis. Electronically Signed   By: Marcello Moores  Register   On: 01/11/2016 11:54    ASSESSMENT AND PLAN:      1. PAF - less than 1% burden on device check today. CHADSVASCS score of 6. Eliquis started Nov 2017 by Neurology after suspected Lt brain embolic stroke.  2. SSS s/p dual chamber st. PPM 04/2008 - Device check today showed no episode of afib dating back to Dec 2016. A packing 98% and V pacing 60%. AF burden < 1%.  3. VT  - On amiodarone 200mg  qd  4. CAD s/p CABG (LIMA-LAD, SVG-RCA, SVG-OM in 1996) with   07/2009 BMS to SVG-RCA.  - Last cath in 04/2010 with patent stents, Myoview march 2017- scar - Continue statin. Off ASA due to anticoagulation.   5. ICM/ chronic combined CHF - hx of intermittent dyspnea. No orthopnea or PND. Ef of 30-44% by [facility] 3/17. Pending echo this admission.   6. Stoke like symptoms with recent CVA  - Unable to get  MRI due to PPM. Head CT without acute finding. Neurology and primary following.   7. Chronic orthostatic hypotension - Stable. Continue midodrine 5mg  TID.   Orthostatic VS for the past 24 hrs:  BP- Lying BP- Sitting BP- Standing at 0 minutes  01/11/16 1100 102/61 93/56 (!) 85/48    Signed: Leanor Kail, PA 01/11/2016, 1:21 PM Pager [phone]  Co-Sign MD  The patient was seen, examined and discussed with Bhagat,Bhavinkumar PA-C and I agree with the above.   82 y.o. male with a history of CAD s/p CABG 1996 (BMS to SVG-RCA in 2011, patent stent on last cath 04/2010), ischemic cardiomyopathy with combined systolic and diastolic heart failure, LVEF 30%,  history of sustained symptomatic ventricular tachycardia (probably "scar VT"), PAF, SN dysfunction with dual chamber permanent pacemaker, embolic CVA 123456, chronic orthostatic hypotension who presented with R sided weakness.  Recent admission in [facility] 11/1-11/3/17 with similar presentation, negative work up for an acute stroke on CT, MRI not done sec to PM in place, neurology started on Eliquis for presumed Lt brain stoke.  This admission repeat CT of head today that showed stable exam. Atrophy and small vessel disease.   Device check today showed no episode of afib dating back to Dec 2016. A packing 98% and V pacing 60%. AF burden < 1%.  Telemetry and EKG showed paced rhythm.    Last Myoview 04/2015 showed no ischemia but scar in the basal and mid inferior and inferolateral leads with associated akinesis.  No further workup needed at this time.  Ena Dawley, MD 01/11/2016

## 2016-01-12 ENCOUNTER — Encounter (HOSPITAL_COMMUNITY): Payer: Self-pay | Admitting: General Practice

## 2016-01-12 ENCOUNTER — Observation Stay (HOSPITAL_BASED_OUTPATIENT_CLINIC_OR_DEPARTMENT_OTHER): Payer: Medicare Other

## 2016-01-12 DIAGNOSIS — R55 Syncope and collapse: Secondary | ICD-10-CM

## 2016-01-12 DIAGNOSIS — Z950 Presence of cardiac pacemaker: Secondary | ICD-10-CM | POA: Diagnosis not present

## 2016-01-12 DIAGNOSIS — I2510 Atherosclerotic heart disease of native coronary artery without angina pectoris: Secondary | ICD-10-CM

## 2016-01-12 DIAGNOSIS — I482 Chronic atrial fibrillation: Secondary | ICD-10-CM | POA: Diagnosis not present

## 2016-01-12 DIAGNOSIS — Z951 Presence of aortocoronary bypass graft: Secondary | ICD-10-CM | POA: Diagnosis not present

## 2016-01-12 DIAGNOSIS — I495 Sick sinus syndrome: Secondary | ICD-10-CM | POA: Diagnosis not present

## 2016-01-12 DIAGNOSIS — I350 Nonrheumatic aortic (valve) stenosis: Secondary | ICD-10-CM

## 2016-01-12 DIAGNOSIS — I472 Ventricular tachycardia: Secondary | ICD-10-CM | POA: Diagnosis not present

## 2016-01-12 DIAGNOSIS — I951 Orthostatic hypotension: Secondary | ICD-10-CM

## 2016-01-12 DIAGNOSIS — I5022 Chronic systolic (congestive) heart failure: Secondary | ICD-10-CM

## 2016-01-12 DIAGNOSIS — K210 Gastro-esophageal reflux disease with esophagitis: Secondary | ICD-10-CM | POA: Diagnosis not present

## 2016-01-12 LAB — ECHOCARDIOGRAM COMPLETE

## 2016-01-12 MED ORDER — CLOPIDOGREL BISULFATE 75 MG PO TABS: 75.0000 mg | ORAL_TABLET | Freq: Every day | ORAL | 2 refills | Status: DC

## 2016-01-12 MED ORDER — ASPIRIN EC 81 MG PO TBEC: 81.0000 mg | DELAYED_RELEASE_TABLET | Freq: Every day | ORAL | 1 refills | Status: DC

## 2016-01-12 MED ORDER — CLONAZEPAM 0.5 MG PO TABS: 0.5000 mg | ORAL_TABLET | Freq: Three times a day (TID) | ORAL | 0 refills | Status: DC | PRN

## 2016-01-12 MED ORDER — POLYETHYLENE GLYCOL 3350 17 G PO PACK: 17.0000 g | PACK | Freq: Every day | ORAL | 0 refills | Status: DC

## 2016-01-12 MED ORDER — MAGNESIUM CITRATE PO SOLN
1.0000 | Freq: Once | ORAL | Status: AC
  Administered 2016-01-12: 1 via ORAL
  Filled 2016-01-12: qty 296

## 2016-01-12 NOTE — Progress Notes (Signed)
Physical Therapy Treatment Patient Details Name: Adam [name] MRN: [id number] DOB: 11/08/1926 Today's Date: 01/12/2016    History of Present Illness 82 year old male presents with acute right-sided weakness. He states he was in his arm and leg. He also had onset of dizziness and feels like his head is dizzy and spinning.  PMH: DDD, congestive cardiomyopathy, heartv failure, CAD, HTN, postural hypotension, intermittent weakness with cerebral and cerebellar atrophy, GERD, RBBB    PT Comments    Pt progressing well with therapy, with increased time patient able to change positions with mild dizziness.  Pt denies dizziness during gait training.  BP in supine 116/68, sitting after 5 min 108/70, standing after 4 min 87/53, standing after 8 min 93/55.  Denies dizziness during gait training.  Pt to d/c home today with HHPT.   Follow Up Recommendations  Home health PT;Supervision for mobility/OOB     Equipment Recommendations  None recommended by PT    Recommendations for Other Services       Precautions / Restrictions Precautions Precautions: Fall Precaution Comments: watch BP Restrictions Weight Bearing Restrictions: No    Mobility  Bed Mobility Overal bed mobility: Needs Assistance Bed Mobility: Supine to Sit     Supine to sit: Min assist Sit to supine: Supervision   General bed mobility comments: Cues for hand placement and assist to elevate trunk required min assist.  Pt instructed to move slowly.  Sat edge of bed x5 min before standing.    Transfers Overall transfer level: Needs assistance Equipment used: Rolling walker (2 wheeled) Transfers: Sit to/from Stand Sit to Stand: Min assist         General transfer comment: Cues for hand placement to push from seated surface.    Ambulation/Gait Ambulation/Gait assistance: Min guard Ambulation Distance (Feet): 30 Feet Assistive device: Rolling walker (2 wheeled) Gait Pattern/deviations: Step-through pattern;Decreased  stride length;Trunk flexed   Gait velocity interpretation: Below normal speed for age/gender General Gait Details: Pt denies dizziness with walking but reports fatigue.  Required chair follow for safety.     Stairs            Wheelchair Mobility    Modified Rankin (Stroke Patients Only)       Balance Overall balance assessment: Needs assistance Sitting-balance support: Feet supported Sitting balance-Leahy Scale: Fair       Standing balance-Leahy Scale: Poor                      Cognition Arousal/Alertness: Awake/alert Behavior During Therapy: WFL for tasks assessed/performed Overall Cognitive Status: Within Functional Limits for tasks assessed                      Exercises      General Comments        Pertinent Vitals/Pain Pain Assessment: 0-10 Pain Score: 0-No pain    Home Living                      Prior Function            PT Goals (current goals can now be found in the care plan section) Acute Rehab PT Goals Patient Stated Goal: return home Potential to Achieve Goals: Good Progress towards PT goals: Progressing toward goals    Frequency    Min 3X/week      PT Plan Current plan remains appropriate    Co-evaluation  End of Session Equipment Utilized During Treatment: Gait belt Activity Tolerance: Patient tolerated treatment well;Patient limited by fatigue Patient left: in chair;with call bell/phone within reach     Time: 1431-1505 PT Time Calculation (min) (ACUTE ONLY): 34 min  Charges:  $Gait Training: 8-22 mins $Therapeutic Activity: 8-22 mins                    G Codes:      Cristela Blue 02-06-16, 3:20 PM  Governor Rooks, PTA pager [phone]

## 2016-01-12 NOTE — Progress Notes (Signed)
Patient Name: Adam [name] Date of Encounter: 01/12/2016  Primary Cardiologist: [facility] Problem List     Active Problems:   Pacemaker   GERD (gastroesophageal reflux disease)   Urinary retention   Insomnia   Chronic a-fib (HCC)   Stroke-like symptoms   Atypical chest pain   Hypotension   Hx of CABG     Subjective   Feels well. No new neuro complaints. Denies angina or dyspnea. Edema is completely gone (while lying in bed and wearing compression stockings).  Inpatient Medications    Scheduled Meds: .  stroke: mapping our early stages of recovery book   Does not apply Once  . amiodarone  200 mg Oral Daily  . apixaban  5 mg Oral BID  . levothyroxine  137 mcg Oral QAC breakfast  . lubiprostone  24 mcg Oral Q breakfast  . midodrine  5 mg Oral TID WC  . pantoprazole  40 mg Oral Daily  . polyethylene glycol  17 g Oral Daily  . pravastatin  40 mg Oral q1800  . rOPINIRole  0.5 mg Oral BID  . senna  1 tablet Oral Daily  . sodium chloride flush  3 mL Intravenous Q12H  . sodium chloride flush  3 mL Intravenous Q12H  . tamsulosin  0.4 mg Oral Daily   Continuous Infusions:  PRN Meds: sodium chloride, [DISCONTINUED] acetaminophen **OR** acetaminophen, acetaminophen, clonazePAM, gi cocktail, morphine injection, ondansetron (ZOFRAN) IV, ondansetron **OR** ondansetron (ZOFRAN) IV, oxyCODONE, rOPINIRole, sodium chloride flush, torsemide, traZODone   Vital Signs    Vitals:   01/11/16 2114 01/12/16 0218 01/12/16 0600 01/12/16 1032  BP: (!) 157/88 129/67 (!) 144/68 (!) 108/56  Pulse: 68 73 69 70  Resp: 18 18 20 20   Temp: 97.8 F (36.6 C) 97.8 F (36.6 C) 98 F (36.7 C) 98 F (36.7 C)  TempSrc: Oral Oral Oral Oral  SpO2: 95% 93% 96%     Intake/Output Summary (Last 24 hours) at 01/12/16 1145 Last data filed at 01/12/16 0259  Gross per 24 hour  Intake                3 ml  Output              500 ml  Net             -497 ml   There were no vitals filed for  this visit.  Physical Exam  Feels well, looks comfortable GEN: Well nourished, well developed, in no acute distress.  HEENT: Grossly normal.  Neck: Supple, no JVD, carotid bruits, or masses. Cardiac: RRR, paradoxically split S2, 3/6 early to mid peaking AS murmur, no diastolic murmurs, rubs, or gallops. No clubbing, cyanosis, edema.  Radials/DP/PT 2+ and equal bilaterally. Healthy subclavian pacer site. Respiratory:  Respirations regular and unlabored, clear to auscultation bilaterally. GI: Soft, nontender, nondistended, BS + x 4. MS: no deformity or atrophy. Skin: warm and dry, no rash. Neuro:  Strength and sensation are intact. Psych: AAOx3.  Normal affect.  Labs    CBC  Recent Labs  01/10/16 1400 01/10/16 1414 01/11/16 0537  WBC 6.0  --  6.4  NEUTROABS 4.1  --   --   HGB 14.1 14.3 13.9  HCT 42.7 42.0 42.2  MCV 97.7  --  97.2  PLT 190  --  0000000   Basic Metabolic Panel  Recent Labs  01/10/16 1400 01/10/16 1414 01/11/16 0537  NA 140 142 140  K 4.0 3.9 4.1  CL 106 102 106  CO2 26  --  27  GLUCOSE 106* 99 97  BUN 14 15 13   CREATININE 0.84 0.80 0.79  CALCIUM 9.0  --  8.8*   Liver Function Tests  Recent Labs  01/10/16 1400  AST 25  ALT 22  ALKPHOS 68  BILITOT 0.4  PROT 6.4*  ALBUMIN 3.5   No results for input(s): LIPASE, AMYLASE in the last 72 hours. Cardiac Enzymes  Recent Labs  01/10/16 1904 01/10/16 2342 01/11/16 0537  TROPONINI <0.03 <0.03 <0.03     Telemetry    A paced Vpaced - Personally Reviewed  ECG    A paced Vpaced  - Personally Reviewed  Radiology    Ct Head Wo Contrast  Result Date: 01/11/2016 CLINICAL DATA:  Headache, dizziness, and blurred vision occurring last night. EXAM: CT HEAD WITHOUT CONTRAST TECHNIQUE: Contiguous axial images were obtained from the base of the skull through the vertex without intravenous contrast. COMPARISON:  CT head 01/10/2016. FINDINGS: Brain: No evidence for acute infarction, hemorrhage, mass  lesion, or extra-axial fluid. Moderate cerebral and cerebellar atrophy. Hydrocephalus ex vacuo. Hypoattenuation of white matter consistent with chronic microvascular ischemic change. Vascular: Extensive vascular calcification, not unexpected for age. No signs of emergent large vessel occlusion. Skull: Normal. Negative for fracture or focal lesion. Sinuses/Orbits: No acute finding. Other: Moderate calcific pannus surrounds the dens. Compared with priors, no change. IMPRESSION: Stable exam. Atrophy and small vessel disease. No acute intracranial findings. Electronically Signed   By: Staci Righter M.D.   On: 01/11/2016 08:48   Ct Head Wo Contrast  Result Date: 01/10/2016 CLINICAL DATA:  Sudden onset of right-sided weakness. History of chronic episodes of dizziness. EXAM: CT HEAD WITHOUT CONTRAST TECHNIQUE: Contiguous axial images were obtained from the base of the skull through the vertex without intravenous contrast. COMPARISON:  12/08/2015 FINDINGS: Brain: No evidence of acute infarction, hemorrhage, hydrocephalus, extra-axial collection or mass lesion/mass effect. There is ventricular and sulcal enlargement reflecting moderate atrophy. Vascular: No hyperdense vessel or unexpected calcification. Skull: Normal. Negative for fracture or focal lesion. Sinuses/Orbits: Visualize globes orbits are unremarkable. Visualized sinuses and mastoid air cells are clear. Other: None. IMPRESSION: 1. No acute intracranial abnormalities. 2. Moderate atrophy. Electronically Signed   By: Lajean Manes M.D.   On: 01/10/2016 13:20   Dg Chest Port 1 View  Result Date: 01/11/2016 CLINICAL DATA:  Weakness.  Shortness of breath . EXAM: PORTABLE CHEST 1 VIEW COMPARISON:  12/03/2014 . FINDINGS: Cardiac pacer with lead tips in right atrium right ventricle. Prior CABG. Cardiomegaly with normal pulmonary vascularity. Low lung volumes. Interim improvement of bibasilar atelectasis. No pleural effusion or pneumothorax. IMPRESSION: 1. Cardiac  pacer in stable position. Prior CABG. Stable cardiomegaly. 2.  Low lung volumes.  Interim improvement of bibasilar atelectasis. Electronically Signed   By: Marcello Moores  Register   On: 01/11/2016 11:54    Cardiac Studies   Echo (review at bedside): LVEF 40% with extensive scar in left circumflex artery distribution, moderately dilated left atrium, no LV thrombus is seen, moderate aortic stenosis, mild mitral insufficiency  Patient Profile     82 yo man with CAD, moderate ischemic CMP, well compensated CHF, severe orthostatic hypotension, moderate AS, history of sustained VT and history of sinus bradycardia and dual chamber PPM, hospitalized for recurrent neuro deficits with pattern similar to November 2017 (R sided weakness).  Eliquis was started in November due to suspicion of AFib-related embolic stroke.  Assessment & Plan  1. CVA: I have reviewed  all of Mr. Mccrea pacemaker downloads available on EMR, going back continuously to 2013, including the one just performed. There has never been any documentation of atrial fibrillation. Atrial fibrillation is not seen on any of his ECG or telemetry strips. Amiodarone was prescribed for symptomatic sustained VT about 3 years ago. He has never taken warfarin, as was reported in previous notes.  AFib is mentioned as a diagnosis in the EMR past history under "Bradycardia:AFib/SSS-s/p [employer] PPM 2010". Unfortunately, this is inaccurate. I went back to look at Dr. Francine Graven notes and the procedure report. There is no mention of atrial fibrillation. He did have "persistent tachycardia at 140 bpm" during his heart catheterization, just before PM implantation. Retrospectively, that may have been VT.  I do not think that Eliquis is indicated. I am also concerned about the risk of fall and injury, including intracranial hemorrhage. Unfortunately, I cannot explain his recent neurological events, which occurred during treatment with . The stereotypical pattern  suggests a local (vascular?) cause, rather than random embolism.   After reviewing these concerns with Mr. And Mrs. Heber and the attending physician, Dr. Allyson Sabal, I have recommended discontinuation of the Eliquis. Dr. Allyson Sabal plans to confer with the inpatient Neurology consultant to decide on the appropriate stroke prevention regimen.  2. SSS s/p PPM: he has 100% A paced rhythm and a high prevalence of V pacing. No atrial fibrillation recorded. No mode switch of any kind in last 12 months. We have reestablished every 17-month downloads via [place] (interrupted when he moved to [facility]). Programmed to avooid V pacing as much as possible, but more V pacing has occurred with amiodarone Rx, thankfully without CHF exacerbation.  3. AS: moderate, so far asymptomatic. He will not be a surgical candidate and may be a marginal candidate for TAVR. Will discuss if he becomes symptomatic.  4. CAD: large scar without ischemia on last nuclear study, he does not have angina.  5. CHF: he appears to be clinically euvolemic. Unfortunately, we have had to gradually stop all CHF meds due to severe orthostatic hypotension complicated by frequent falls. The goal of diuretic use should be avoiding dyspnea and severe uncomfortable edema, but some lower extremity edema should be tolerated to reduce likelihood of orthostatic syncope.  6. Orthostatic hypotension: avoid excessive diuresis, wear compression stockings (preferably thigh-high), even consider abdominal binder. Avoid volume expanders (sodium chloride tabs or fludrocortisone) due to CHF. Can use ProAmatine if the situation worsens, but have to be cautious due to risk of supine HTN.  7. VT: current treatment with amiodarone has been very successful at preventing events. Continue same strategy and check LFTs and TFTs at least twice yearly. Previously reviewed ICD therapy with EP and felt best suited for conservative management.    Signed, [name], MD    01/12/2016, 11:45 AM

## 2016-01-12 NOTE — Care Management Note (Signed)
Case Management Note  Patient Details  Name: Adam [name] MRN: [id number] Date of Birth: 25-Jan-1927  Subjective/Objective:                    Action/Plan: Plan is for patient to return to [place] with his wife. Pt asked to use either [facility] or [facility]. CM spoke to [facility] and they are not able to provide all the service ordered. Santiago Glad with [facility] notified and they accepted the referral.   Expected Discharge Date:                  Expected Discharge Plan:  [place]  In-House Referral:     Discharge planning Services  CM Consult  Post Acute Care Choice:  Home Health Choice offered to:  Patient, Spouse  DME Arranged:    DME Agency:     HH Arranged:  PT, OT, Nurse's Aide [place] Agency:  [place]  Status of Service:  Completed, signed off  If discussed at [place] of Stay Meetings, dates discussed:    Additional Comments:  Pollie Friar, RN 01/12/2016, 12:59 PM

## 2016-01-12 NOTE — Progress Notes (Signed)
Patient is discharged home. Discharge instruction were given to patient and family.

## 2016-01-12 NOTE — Discharge Summary (Addendum)
Physician Discharge Summary  Adam [name] MRN: [id number] DOB/AGE: Mar 03, 1926 82 y.o.  PCP: Jeanmarie Hubert, MD   Admit date: 01/10/2016 Discharge date: 01/12/2016  Discharge Diagnoses:    Active Problems:   Pacemaker   GERD (gastroesophageal reflux disease)   Urinary retention   Insomnia   Stroke-like symptoms   Atypical chest pain   Hypotension   Hx of CABG    Follow-up recommendations Follow-up with PCP in 3-5 days , including all  additional recommended appointments as below Follow-up CBC, CMP in 3-5 days follow up with Dr. Rexene Alberts at [facility] on 02/22/16 Recommend TED hose above the knee for orthostatic hypotension    Current Discharge Medication List    START taking these medications   Details  aspirin EC 81 MG tablet Take 1 tablet (81 mg total) by mouth daily. Qty: 30 tablet, Refills: 1    clopidogrel (PLAVIX) 75 MG tablet Take 1 tablet (75 mg total) by mouth daily. Qty: 30 tablet, Refills: 2    polyethylene glycol (MIRALAX / GLYCOLAX) packet Take 17 g by mouth daily. Qty: 14 each, Refills: 0      CONTINUE these medications which have CHANGED   Details  clonazePAM (KLONOPIN) 0.5 MG tablet Take 1 tablet (0.5 mg total) by mouth 3 (three) times daily as needed (anxiety, RLS, sleep). Qty: 30 tablet, Refills: 0      CONTINUE these medications which have NOT CHANGED   Details  amiodarone (PACERONE) 200 MG tablet Take 200 mg by mouth daily.    Cholecalciferol (VITAMIN D3) 5000 UNITS CAPS Take 1 capsule by mouth every Monday. Reported on 02/22/2015    clobetasol (TEMOVATE) 0.05 % external solution Apply 1 application topically daily as needed (for itching). TO SCALP    levothyroxine (SYNTHROID, LEVOTHROID) 137 MCG tablet Take 1 tablet (137 mcg total) by mouth daily before breakfast. Qty: 90 tablet, Refills: 3    lubiprostone (AMITIZA) 24 MCG capsule Take 24 mcg by mouth daily.    midodrine (PROAMATINE) 5 MG tablet Take 1 tablet (5 mg total) by mouth 3 (three) times  daily with meals. Take third dose at least 6 hours before bedtime Qty: 270 tablet, Refills: 3    pantoprazole (PROTONIX) 40 MG tablet Take one tablet by mouth at bedtime to reduce stomach acid Qty: 90 tablet, Refills: 3    pravastatin (PRAVACHOL) 40 MG tablet Take 1 tablet (40 mg total) by mouth daily. To lower cholesterol Qty: 90 tablet, Refills: 1    rOPINIRole (REQUIP) 0.5 MG tablet Take 1 tablet (0.5 mg total) by mouth 2 (two) times daily. Qty: 180 tablet, Refills: 3    SENNA PO Take 2 tablets by mouth at bedtime as needed (for constipation).     tamsulosin (FLOMAX) 0.4 MG CAPS capsule Take one capsule by mouth once daily to help improve urinary frequency Qty: 90 capsule, Refills: 3    torsemide (DEMADEX) 20 MG tablet Take 1 tablets daily if you gain 3 or more pounds weight. Stop when weight returns to normal.. Qty: 30 tablet, Refills: 11   Associated Diagnoses: Combined systolic and diastolic congestive heart failure, unspecified congestive heart failure chronicity (HCC)      STOP taking these medications     apixaban (ELIQUIS) 5 MG TABS tablet      traZODone (DESYREL) 50 MG tablet           Discharge Condition:  Stable  Discharge Instructions Get Medicines reviewed and adjusted: Please take all your medications with you for your next  visit with your Primary MD  Please request your Primary MD to go over all hospital tests and procedure/radiological results at the follow up, please ask your Primary MD to get all Hospital records sent to his/her office.  If you experience worsening of your admission symptoms, develop shortness of breath, life threatening emergency, suicidal or homicidal thoughts you must seek medical attention immediately by calling 911 or calling your MD immediately if symptoms less severe.  You must read complete instructions/literature along with all the possible adverse reactions/side effects for all the Medicines you take and that have been  prescribed to you. Take any new Medicines after you have completely understood and accpet all the possible adverse reactions/side effects.   Do not drive when taking Pain medications.   Do not take more than prescribed Pain, Sleep and Anxiety Medications  Special Instructions: If you have smoked or chewed Tobacco in the last 2 yrs please stop smoking, stop any regular Alcohol and or any Recreational drug use.  Wear Seat belts while driving.  Please note  You were cared for by a hospitalist during your hospital stay. Once you are discharged, your primary care physician will handle any further medical issues. Please note that NO REFILLS for any discharge medications will be authorized once you are discharged, as it is imperative that you return to your primary care physician (or establish a relationship with a primary care physician if you do not have one) for your aftercare needs so that they can reassess your need for medications and monitor your lab values.     Allergies  Allergen Reactions  . Crestor [Rosuvastatin Calcium] Other (See Comments)    Unknown reaction   . Altace [Ramipril] Other (See Comments)    Cough  . Bactrim [Sulfamethoxazole-Trimethoprim] Rash  . Penicillins Rash    Has patient had a PCN reaction causing immediate rash, facial/tongue/throat swelling, SOB or lightheadedness with hypotension: No Has patient had a PCN reaction causing severe rash involving mucus membranes or skin necrosis: Yes Has patient had a PCN reaction that required hospitalization Yes Has patient had a PCN reaction occurring within the last 10 years: No If all of the above answers are "NO", then may proceed with Cephalosporin use.   . Sulfa Antibiotics Rash      Disposition: 06-Home-Health Care Svc   Consults: * Cardiology Neurology    Significant Diagnostic Studies:  Ct Head Wo Contrast  Result Date: 01/11/2016 CLINICAL DATA:  Headache, dizziness, and blurred vision occurring  last night. EXAM: CT HEAD WITHOUT CONTRAST TECHNIQUE: Contiguous axial images were obtained from the base of the skull through the vertex without intravenous contrast. COMPARISON:  CT head 01/10/2016. FINDINGS: Brain: No evidence for acute infarction, hemorrhage, mass lesion, or extra-axial fluid. Moderate cerebral and cerebellar atrophy. Hydrocephalus ex vacuo. Hypoattenuation of white matter consistent with chronic microvascular ischemic change. Vascular: Extensive vascular calcification, not unexpected for age. No signs of emergent large vessel occlusion. Skull: Normal. Negative for fracture or focal lesion. Sinuses/Orbits: No acute finding. Other: Moderate calcific pannus surrounds the dens. Compared with priors, no change. IMPRESSION: Stable exam. Atrophy and small vessel disease. No acute intracranial findings. Electronically Signed   By: Staci Righter M.D.   On: 01/11/2016 08:48   Ct Head Wo Contrast  Result Date: 01/10/2016 CLINICAL DATA:  Sudden onset of right-sided weakness. History of chronic episodes of dizziness. EXAM: CT HEAD WITHOUT CONTRAST TECHNIQUE: Contiguous axial images were obtained from the base of the skull through the vertex without intravenous  contrast. COMPARISON:  12/08/2015 FINDINGS: Brain: No evidence of acute infarction, hemorrhage, hydrocephalus, extra-axial collection or mass lesion/mass effect. There is ventricular and sulcal enlargement reflecting moderate atrophy. Vascular: No hyperdense vessel or unexpected calcification. Skull: Normal. Negative for fracture or focal lesion. Sinuses/Orbits: Visualize globes orbits are unremarkable. Visualized sinuses and mastoid air cells are clear. Other: None. IMPRESSION: 1. No acute intracranial abnormalities. 2. Moderate atrophy. Electronically Signed   By: Lajean Manes M.D.   On: 01/10/2016 13:20   Dg Chest Port 1 View  Result Date: 01/11/2016 CLINICAL DATA:  Weakness.  Shortness of breath . EXAM: PORTABLE CHEST 1 VIEW COMPARISON:   12/03/2014 . FINDINGS: Cardiac pacer with lead tips in right atrium right ventricle. Prior CABG. Cardiomegaly with normal pulmonary vascularity. Low lung volumes. Interim improvement of bibasilar atelectasis. No pleural effusion or pneumothorax. IMPRESSION: 1. Cardiac pacer in stable position. Prior CABG. Stable cardiomegaly. 2.  Low lung volumes.  Interim improvement of bibasilar atelectasis. Electronically Signed   By: Marcello Moores  Register   On: 01/11/2016 11:54         Labs: Results for orders placed or performed during the hospital encounter of 01/10/16 (from the past 48 hour(s))  Protime-INR     Status: None   Collection Time: 01/10/16  2:00 PM  Result Value Ref Range   Prothrombin Time 14.7 11.4 - 15.2 seconds   INR 1.14   APTT     Status: None   Collection Time: 01/10/16  2:00 PM  Result Value Ref Range   aPTT 36 24 - 36 seconds  CBC     Status: None   Collection Time: 01/10/16  2:00 PM  Result Value Ref Range   WBC 6.0 4.0 - 10.5 K/uL   RBC 4.37 4.22 - 5.81 MIL/uL   Hemoglobin 14.1 13.0 - 17.0 g/dL   HCT 42.7 39.0 - 52.0 %   MCV 97.7 78.0 - 100.0 fL   MCH 32.3 26.0 - 34.0 pg   MCHC 33.0 30.0 - 36.0 g/dL   RDW 14.4 11.5 - 15.5 %   Platelets 190 150 - 400 K/uL  Differential     Status: None   Collection Time: 01/10/16  2:00 PM  Result Value Ref Range   Neutrophils Relative % 67 %   Neutro Abs 4.1 1.7 - 7.7 K/uL   Lymphocytes Relative 22 %   Lymphs Abs 1.3 0.7 - 4.0 K/uL   Monocytes Relative 9 %   Monocytes Absolute 0.5 0.1 - 1.0 K/uL   Eosinophils Relative 1 %   Eosinophils Absolute 0.1 0.0 - 0.7 K/uL   Basophils Relative 1 %   Basophils Absolute 0.0 0.0 - 0.1 K/uL  Comprehensive metabolic panel     Status: Abnormal   Collection Time: 01/10/16  2:00 PM  Result Value Ref Range   Sodium 140 135 - 145 mmol/L   Potassium 4.0 3.5 - 5.1 mmol/L   Chloride 106 101 - 111 mmol/L   CO2 26 22 - 32 mmol/L   Glucose, Bld 106 (H) 65 - 99 mg/dL   BUN 14 6 - 20 mg/dL   Creatinine,  Ser 0.84 0.61 - 1.24 mg/dL   Calcium 9.0 8.9 - 10.3 mg/dL   Total Protein 6.4 (L) 6.5 - 8.1 g/dL   Albumin 3.5 3.5 - 5.0 g/dL   AST 25 15 - 41 U/L   ALT 22 17 - 63 U/L   Alkaline Phosphatase 68 38 - 126 U/L   Total Bilirubin 0.4 0.3 - 1.2  mg/dL   GFR calc non Af Amer >60 >60 mL/min   GFR calc Af Amer >60 >60 mL/min    Comment: (NOTE) The eGFR has been calculated using the CKD EPI equation. This calculation has not been validated in all clinical situations. eGFR's persistently <60 mL/min signify possible Chronic Kidney Disease.    Anion gap 8 5 - 15  Urine rapid drug screen (hosp performed)not at [facility]     Status: None   Collection Time: 01/10/16  2:05 PM  Result Value Ref Range   Opiates NONE DETECTED NONE DETECTED   Cocaine NONE DETECTED NONE DETECTED   Benzodiazepines NONE DETECTED NONE DETECTED   Amphetamines NONE DETECTED NONE DETECTED   Tetrahydrocannabinol NONE DETECTED NONE DETECTED   Barbiturates NONE DETECTED NONE DETECTED    Comment:        DRUG SCREEN FOR MEDICAL PURPOSES ONLY.  IF CONFIRMATION IS NEEDED FOR ANY PURPOSE, NOTIFY LAB WITHIN 5 DAYS.        LOWEST DETECTABLE LIMITS FOR URINE DRUG SCREEN Drug Class       Cutoff (ng/mL) Amphetamine      1000 Barbiturate      200 Benzodiazepine   875 Tricyclics       643 Opiates          300 Cocaine          300 THC              50   Urinalysis, Routine w reflex microscopic     Status: Abnormal   Collection Time: 01/10/16  2:05 PM  Result Value Ref Range   Color, Urine STRAW (A) YELLOW   APPearance CLEAR CLEAR   Specific Gravity, Urine 1.010 1.005 - 1.030   pH 6.0 5.0 - 8.0   Glucose, UA NEGATIVE NEGATIVE mg/dL   Hgb urine dipstick NEGATIVE NEGATIVE   Bilirubin Urine NEGATIVE NEGATIVE   Ketones, ur NEGATIVE NEGATIVE mg/dL   Protein, ur NEGATIVE NEGATIVE mg/dL   Nitrite NEGATIVE NEGATIVE   Leukocytes, UA NEGATIVE NEGATIVE  I-stat troponin, ED (not at [facility], [facility])     Status: None   Collection Time: 01/10/16   2:12 PM  Result Value Ref Range   Troponin i, poc 0.01 0.00 - 0.08 ng/mL   Comment 3            Comment: Due to the release kinetics of cTnI, a negative result within the first hours of the onset of symptoms does not rule out myocardial infarction with certainty. If myocardial infarction is still suspected, repeat the test at appropriate intervals.   I-Stat Chem 8, ED  (not at [facility], [facility])     Status: Abnormal   Collection Time: 01/10/16  2:14 PM  Result Value Ref Range   Sodium 142 135 - 145 mmol/L   Potassium 3.9 3.5 - 5.1 mmol/L   Chloride 102 101 - 111 mmol/L   BUN 15 6 - 20 mg/dL   Creatinine, Ser 0.80 0.61 - 1.24 mg/dL   Glucose, Bld 99 65 - 99 mg/dL   Calcium, Ion 1.14 (L) 1.15 - 1.40 mmol/L   TCO2 27 0 - 100 mmol/L   Hemoglobin 14.3 13.0 - 17.0 g/dL   HCT 42.0 39.0 - 52.0 %  Troponin I     Status: None   Collection Time: 01/10/16  7:04 PM  Result Value Ref Range   Troponin I <0.03 <0.03 ng/mL  Troponin I     Status: None   Collection Time: 01/10/16 11:42  PM  Result Value Ref Range   Troponin I <0.03 <0.03 ng/mL  Troponin I     Status: None   Collection Time: 01/11/16  5:37 AM  Result Value Ref Range   Troponin I <0.03 <0.03 ng/mL  CBC     Status: None   Collection Time: 01/11/16  5:37 AM  Result Value Ref Range   WBC 6.4 4.0 - 10.5 K/uL   RBC 4.34 4.22 - 5.81 MIL/uL   Hemoglobin 13.9 13.0 - 17.0 g/dL   HCT 42.2 39.0 - 52.0 %   MCV 97.2 78.0 - 100.0 fL   MCH 32.0 26.0 - 34.0 pg   MCHC 32.9 30.0 - 36.0 g/dL   RDW 14.5 11.5 - 15.5 %   Platelets 188 150 - 400 K/uL  Basic metabolic panel     Status: Abnormal   Collection Time: 01/11/16  5:37 AM  Result Value Ref Range   Sodium 140 135 - 145 mmol/L   Potassium 4.1 3.5 - 5.1 mmol/L   Chloride 106 101 - 111 mmol/L   CO2 27 22 - 32 mmol/L   Glucose, Bld 97 65 - 99 mg/dL   BUN 13 6 - 20 mg/dL   Creatinine, Ser 0.79 0.61 - 1.24 mg/dL   Calcium 8.8 (L) 8.9 - 10.3 mg/dL   GFR calc non Af Amer >60 >60 mL/min   GFR  calc Af Amer >60 >60 mL/min    Comment: (NOTE) The eGFR has been calculated using the CKD EPI equation. This calculation has not been validated in all clinical situations. eGFR's persistently <60 mL/min signify possible Chronic Kidney Disease.    Anion gap 7 5 - 15  Urinalysis, Routine w reflex microscopic     Status: None   Collection Time: 01/11/16  5:52 PM  Result Value Ref Range   Color, Urine YELLOW YELLOW   APPearance CLEAR CLEAR   Specific Gravity, Urine 1.012 1.005 - 1.030   pH 7.0 5.0 - 8.0   Glucose, UA NEGATIVE NEGATIVE mg/dL   Hgb urine dipstick NEGATIVE NEGATIVE   Bilirubin Urine NEGATIVE NEGATIVE   Ketones, ur NEGATIVE NEGATIVE mg/dL   Protein, ur NEGATIVE NEGATIVE mg/dL   Nitrite NEGATIVE NEGATIVE   Leukocytes, UA NEGATIVE NEGATIVE     Lipid Panel     Component Value Date/Time   CHOL 84 12/07/2015 0449   TRIG 102 12/07/2015 0449   HDL 41 12/07/2015 0449   CHOLHDL 2.0 12/07/2015 0449   VLDL 20 12/07/2015 0449   LDLCALC 23 12/07/2015 0449         HPI :  82 y.o. 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 . Started around 11 AM 01/10/2016 ([facility]). He was previously up and had no symptoms before this. He also had onset of dizziness and feels like his head is dizzy and spinning.  He states that he had a stroke back in the beginning of November with similar symptoms. Has also had intermittent weakness since but it typically resolves. Today was worse than the other times.He is on Eliquis. HE also has periods of dizziness but states it often occurs in the dark when he cannot see very well and he has  peripheral neuropathy. Patient was not administered IV t-PA secondary to being on eliquis. He was admitted for further evaluation and treatment. Patient found to be fairly orthostatic during this hospitalization. Systolic blood pressure dropped from 102>85 with standing.  HOSPITAL COURSE:    Dizziness/vertigo Orthostatics positive Neurochecks stable CT of the head without any acute abnormality PT OT evaluation-home health Cardiology consult in for device check showed no episode of afib dating back to Dec 2016. A packing 98% and V pacing 60%. AF burden < 1%.  Telemetry and EKG showed paced rhythm.  Last Myoview 04/2015 showed no ischemia but scar in the basal and mid inferior and inferolateral leads with associated akinesis. No further workup needed at this time per cardiology.   Embolic cerebral infarction ([place]) Pt suspected to have a Lt brain stroke 12/06/15 with Rt leg weakness CT head 12/7 stable CTA neck 12/08/15 No significant stenosis, proximal occlusion, or vascular malformation of the circle of Willis. Full stroke work up was completed in nov 2017 Dr Leonie Man had placed patient on eliquis for presumed A fib Dr C from cardiology reviewed pacemaker device all the way back to 2013 with no evidence of a fib Based on his recommendation eliquis was discontinued and we have placed him back on asa and plavix after talking to Dr Erlinda Hong  Cardiomyopathy, ischemic Last cath in 04/2010 with patent stents. Last EF 30-44% by Myoview March 2017 Continue statin.  Repeat echo shows near severe AS and EF of 40%   Orthostatic hypotension Patient did receive a fluid bolus during this admission Blood pressure has now improved, no   underlying infection was found to be contributing to his symptoms UA negative 2 Chest x-ray no pneumonia Recommend thigh high  support stockings if patient continues to have low blood pressure   Coronary artery disease with hx of myocardial infarct w/o hx of  CABG CABG (LIMA-LAD, SVG-RCA, SVG-OM in 1996). 07/2009 BMS to SVG-RCA. Cath in 04/2010 with patent stents, Myoview march 2017- scar Cardiac enzymes negative    Pacemaker [employer] RF [id number] dual-chamber permanent pacemaker implanted March 2010 for sinus node dysfunction. We will interrogate  VT (ventricular tachycardia) (HCC) On Amiodarone, now on 200 mg daily  No evidence of PAF - less than 1% burden on device check today. CHADSVASCS score of 6. Eliquis started Nov 2017 by Neurology after suspected Lt brain embolic stroke. But dc after recommendations from Dr C  Chronic anticoagulation Eliquis started Nov 2017 by Neurology after suspected Lt brain embolic stroke but now discontinued     Discharge Exam: *  Blood pressure (!) 144/68, pulse 69, temperature 98 F (36.7 C), temperature source Oral, resp. rate 20, SpO2 96 %.  General: Well developed, well nourished, male in no acute distress Head: Eyes PERRLA, No xanthomas. Normocephalic and atraumatic, oropharynx without edema or exudate.  Lungs: Resp regular and unlabored, CTA. Heart: RRR no s3, s4, or murmurs..   Neck: No carotid bruits. No lymphadenopathy.  No JVD. Abdomen: Bowel sounds present, abdomen soft and non-tender without masses or hernias noted. Msk:  No spine or cva tenderness. No weakness, no joint deformities or effusions. Extremities: No clubbing, cyanosis or edema. DP/PT/Radials 2+ and equal bilaterally.    Follow-up Information    Jeanmarie Hubert, MD. Schedule an appointment as soon as possible for a visit in 2 day(s).   Specialty:  Internal Medicine Why:  Hospital follow-up Contact information: [place] [address] [phone]  Star Age, MD. Go on 02/22/2016.   Specialties:  Neurology, Radiology Contact information: [address] [place] [address] [phone]           Signed: Reyne Dumas 01/12/2016, 9:07 AM        Time spent  >45 mins

## 2016-01-12 NOTE — Progress Notes (Signed)
  Echocardiogram 2D Echocardiogram has been performed.  Jennette Dubin 01/12/2016, 10:03 AM

## 2016-01-14 ENCOUNTER — Other Ambulatory Visit: Payer: Self-pay | Admitting: Nurse Practitioner

## 2016-01-15 ENCOUNTER — Other Ambulatory Visit: Payer: Self-pay | Admitting: Nurse Practitioner

## 2016-01-15 ENCOUNTER — Other Ambulatory Visit: Payer: Self-pay | Admitting: Internal Medicine

## 2016-01-16 ENCOUNTER — Non-Acute Institutional Stay: Payer: Medicare Other | Admitting: Internal Medicine

## 2016-01-16 ENCOUNTER — Encounter: Payer: Self-pay | Admitting: Internal Medicine

## 2016-01-16 ENCOUNTER — Telehealth: Payer: Self-pay | Admitting: *Deleted

## 2016-01-16 ENCOUNTER — Other Ambulatory Visit: Payer: Self-pay | Admitting: Internal Medicine

## 2016-01-16 VITALS — BP 120/78 | HR 69 | Temp 97.7°F | Ht 70.0 in | Wt 182.0 lb

## 2016-01-16 DIAGNOSIS — I5040 Unspecified combined systolic (congestive) and diastolic (congestive) heart failure: Secondary | ICD-10-CM

## 2016-01-16 DIAGNOSIS — R29898 Other symptoms and signs involving the musculoskeletal system: Secondary | ICD-10-CM

## 2016-01-16 DIAGNOSIS — R42 Dizziness and giddiness: Secondary | ICD-10-CM

## 2016-01-16 DIAGNOSIS — Z7901 Long term (current) use of anticoagulants: Secondary | ICD-10-CM | POA: Diagnosis not present

## 2016-01-16 DIAGNOSIS — I951 Orthostatic hypotension: Secondary | ICD-10-CM

## 2016-01-16 DIAGNOSIS — G2581 Restless legs syndrome: Secondary | ICD-10-CM

## 2016-01-16 MED ORDER — CLONAZEPAM 0.5 MG PO TABS: ORAL_TABLET | ORAL | 1 refills | Status: DC

## 2016-01-16 NOTE — Telephone Encounter (Signed)
Patient called and stated that he saw you this morning at [facility] and forgot to talk with you about a medication his Cardiologist told him to speak with you about and it is Trazadone. Patient has taken it and wants to start taking it again but the Cardiologist told him to talk with you about prescribing it. Please Advise.

## 2016-01-16 NOTE — Progress Notes (Signed)
Facility  [facility]    Place of Service: Clinic (12)     Allergies  Allergen Reactions  . Crestor [Rosuvastatin Calcium] Other (See Comments)    Unknown reaction   . Altace [Ramipril] Other (See Comments)    Cough  . Bactrim [Sulfamethoxazole-Trimethoprim] Rash  . Penicillins Rash    Has patient had a PCN reaction causing immediate rash, facial/tongue/throat swelling, SOB or lightheadedness with hypotension: No Has patient had a PCN reaction causing severe rash involving mucus membranes or skin necrosis: Yes Has patient had a PCN reaction that required hospitalization Yes Has patient had a PCN reaction occurring within the last 10 years: No If all of the above answers are "NO", then may proceed with Cephalosporin use.   . Sulfa Antibiotics Rash    Chief Complaint  Patient presents with  . Hospitalization Follow-up    TOC 01/10/16 to 01/12/16 dizziness/vertigo, embolic cerebral infarction, orthostatic hypotension. Here with wife    HPI:  Hospitalized 01/10/16 through 01/12/16. Presented with right sided weakness. Also with dizziness and spinning sensation. 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 Currently on Plavix and ASA. Since he returned home, he has had full recovery from the right sided weakness. Denies sensory deficit.  Orthostatic changes in BP noted during his last hospitalization. He still has to be careful after standing up. He is using compression stockings. He is attending exercise classes at [facility]. He uses a stationary bike or the [employer] equipment.   Dizziness with changes in position persists.  Medications: Patient's Medications  New Prescriptions   No medications on file  Previous Medications   AMIODARONE (PACERONE) 200 MG TABLET    Take 200 mg by mouth daily.   ASPIRIN EC 81 MG TABLET    Take 1 tablet (81 mg total) by mouth daily.   CHOLECALCIFEROL (VITAMIN D3) 5000 UNITS CAPS    Take 1 capsule by mouth every Monday. Reported on 02/22/2015   CLOBETASOL  (TEMOVATE) 0.05 % EXTERNAL SOLUTION    Apply 1 application topically daily as needed (for itching). TO SCALP   CLONAZEPAM (KLONOPIN) 0.5 MG TABLET    Take 1 tablet (0.5 mg total) by mouth 3 (three) times daily as needed (anxiety, RLS, sleep).   CLOPIDOGREL (PLAVIX) 75 MG TABLET    Take 1 tablet (75 mg total) by mouth daily.   LEVOTHYROXINE (SYNTHROID, LEVOTHROID) 137 MCG TABLET    Take 1 tablet (137 mcg total) by mouth daily before breakfast.   LUBIPROSTONE (AMITIZA) 24 MCG CAPSULE    Take 24 mcg by mouth daily.   MIDODRINE (PROAMATINE) 5 MG TABLET    Take 1 tablet (5 mg total) by mouth 3 (three) times daily with meals. Take third dose at least 6 hours before bedtime   PANTOPRAZOLE (PROTONIX) 40 MG TABLET    Take 40 mg by mouth. Take one tablet at bedtime to reduce stomach acid   POLYETHYLENE GLYCOL (MIRALAX / GLYCOLAX) PACKET    Take 17 g by mouth daily.   PRAVASTATIN (PRAVACHOL) 40 MG TABLET    Take 1 tablet (40 mg total) by mouth daily. To lower cholesterol   ROPINIROLE (REQUIP) 0.5 MG TABLET    Take 0.5 mg by mouth. Take one tablet twice daily   SENNA PO    Take 2 tablets by mouth at bedtime as needed (for constipation).    TAMSULOSIN (FLOMAX) 0.4 MG CAPS CAPSULE    Take 0.4 mg by mouth. Take one capsule once daily to help improve urinary  frequency   TORSEMIDE (DEMADEX) 20 MG TABLET    Take 20 mg by mouth. Take one tablet daily, if you gain 3 or more pounds weight. Stop when weight returns to normal.  Modified Medications   No medications on file  Discontinued Medications   PANTOPRAZOLE (PROTONIX) 40 MG TABLET    Take one tablet by mouth at bedtime to reduce stomach acid   ROPINIROLE (REQUIP) 0.5 MG TABLET    Take 1 tablet (0.5 mg total) by mouth 2 (two) times daily.   TAMSULOSIN (FLOMAX) 0.4 MG CAPS CAPSULE    Take one capsule by mouth once daily to help improve urinary frequency   TORSEMIDE (DEMADEX) 20 MG TABLET    Take 1 tablets daily if you gain 3 or more pounds weight. Stop when weight  returns to normal..     Review of Systems  Constitutional: Positive for fatigue.  HENT: Positive for hearing loss and tinnitus.   Eyes: Negative for photophobia and pain.  Respiratory: Positive for shortness of breath. Negative for cough.   Cardiovascular: Positive for leg swelling (2+ and usingcompression hosiery). Negative for chest pain.       Congestive cardiomyopathy. Coronary artery disease, status post CABG. Cardiac arrhythmias, status post pacemaker implanted left upper chest.  Gastrointestinal: Positive for constipation. Negative for abdominal pain, diarrhea, nausea and vomiting.  Genitourinary: Positive for enuresis and frequency. Negative for hematuria.       Double voids. Urgency. Frequency. Nocturia x 7.  Musculoskeletal: Positive for gait problem (using rolloing walker). Negative for back pain, myalgias and neck pain.       Ambulates with walker.   Skin: Negative for rash.       Chronic medial right buttock pressure ulcer.  Neurological: Positive for dizziness, speech difficulty (sometimes has difficulty with word finding) and weakness. Negative for tremors.       MRI brain disclosed cerebral atrophy and microvascular disease.  Insomnia and restless legs are improved. Patient reports that he has H/O dizziness. This makes the patient feel unbalanced. R hand numbness.  Cognitive decline since 2014.  Psychiatric/Behavioral: Positive for sleep disturbance (nocturia x 7). Negative for hallucinations and suicidal ideas. The patient is not nervous/anxious.        Not enough sleep, decreased energy     Vitals:   01/16/16 1120  BP: 120/78  Pulse: 69  Temp: 97.7 F (36.5 C)  TempSrc: Oral  SpO2: 97%  Weight: 182 lb (82.6 kg)  Height: _0  (1.778 m)   Wt Readings from Last 3 Encounters:  01/16/16 182 lb (82.6 kg)  12/22/15 183 lb 9.6 oz (83.3 kg)  12/12/15 183 lb (83 kg)    Body mass index is 26.11 kg/m.  Physical Exam  Constitutional: He is oriented to person,  place, and time.  Frail.  HENT:  Head: Normocephalic and atraumatic.  Right Ear: External ear normal.  Left Ear: External ear normal.  Eyes: Conjunctivae and EOM are normal. Pupils are equal, round, and reactive to light. Right eye exhibits no discharge. Left eye exhibits no discharge.  Neck: Normal range of motion. Neck supple. No JVD present. No tracheal deviation present. No thyromegaly present.  Cardiovascular:  Murmur heard. 8-1/2 systolic  Pulmonary/Chest: Effort normal and breath sounds normal. No respiratory distress. He has no wheezes. He has no rales.  Abdominal: He exhibits no distension. There is no tenderness.  abd hernia  Genitourinary: No penile tenderness.  Musculoskeletal: Normal range of motion. He exhibits edema (2+ bipedal). He exhibits  no tenderness.  Gait disturbance and loss of balance. Patient arrives in a  wheelchair today.  Neurological: He is alert and oriented to person, place, and time. He has normal reflexes. No cranial nerve deficit. He exhibits normal muscle tone. Coordination normal.  Skin: Skin is warm and dry. No rash noted. No erythema.  Psychiatric: He has a normal mood and affect. His behavior is normal. Judgment and thought content normal.     Labs reviewed: Lab Summary Latest Ref Rng & Units 01/11/2016 01/10/2016 01/10/2016 12/07/2015  Hemoglobin 13.0 - 17.0 g/dL 13.9 14.3 14.1 (None)  Hematocrit 39.0 - 52.0 % 42.2 42.0 42.7 (None)  White count 4.0 - 10.5 K/uL 6.4 (None) 6.0 (None)  Platelet count 150 - 400 K/uL 188 (None) 190 (None)  Sodium 135 - 145 mmol/L 140 142 140 (None)  Potassium 3.5 - 5.1 mmol/L 4.1 3.9 4.0 (None)  Calcium 8.9 - 10.3 mg/dL 8.8(L) (None) 9.0 (None)  Phosphorus - (None) (None) (None) (None)  Creatinine 0.61 - 1.24 mg/dL 0.79 0.80 0.84 (None)  AST 15 - 41 U/L (None) (None) 25 (None)  Alk Phos 38 - 126 U/L (None) (None) 68 (None)  Bilirubin 0.3 - 1.2 mg/dL (None) (None) 0.4 (None)  Glucose 65 - 99 mg/dL 97 99 106(H) (None)   Cholesterol 0 - 200 mg/dL (None) (None) (None) 84  HDL cholesterol >40 mg/dL (None) (None) (None) 41  Triglycerides <150 mg/dL (None) (None) (None) 102  LDL Direct - (None) (None) (None) (None)  LDL Calc 0 - 99 mg/dL (None) (None) (None) 23  Total protein 6.5 - 8.1 g/dL (None) (None) 6.4(L) (None)  Albumin 3.5 - 5.0 g/dL (None) (None) 3.5 (None)  Some recent data might be hidden   Lab Results  Component Value Date   TSH 2.58 11/27/2015   Lab Results  Component Value Date   BUN 13 01/11/2016   BUN 15 01/10/2016   BUN 14 01/10/2016   Lab Results  Component Value Date   CREATININE 0.79 01/11/2016   CREATININE 0.80 01/10/2016   CREATININE 0.84 01/10/2016   Lab Results  Component Value Date   HGBA1C 5.6 12/07/2015   HGBA1C 5.9 04/20/2015   HGBA1C 5.8 02/16/2015       Assessment/Plan  1. Dizzy Likely related to orthostatic hypotension  2. Orthostatic hypotension Continue compression stockings and mitodrine  3. Chronic anticoagulation Continue Plavix and ASA AF was removed from list of diagnoses. This was a presumptive diagnosis related to his embolic CVA in Nov 3005. It was never confirmed. Interrogation of his pacer did not show AF.  4. Right leg weakness resolved  5. Combined systolic and diastolic congestive heart failure, unspecified congestive heart failure chronicity ([place]) compensated

## 2016-01-17 MED ORDER — TRAZODONE HCL 100 MG PO TABS: ORAL_TABLET | ORAL | 5 refills | Status: DC

## 2016-01-17 NOTE — Telephone Encounter (Signed)
Call in Rx for trazodone 100 mg (30) One nightly for rest. 5 refills.

## 2016-01-17 NOTE — Telephone Encounter (Signed)
Patient notified and agreed. Faxed to pharmacy.

## 2016-01-20 ENCOUNTER — Other Ambulatory Visit: Payer: Self-pay | Admitting: Internal Medicine

## 2016-02-02 ENCOUNTER — Other Ambulatory Visit: Payer: Self-pay | Admitting: Internal Medicine

## 2016-02-03 ENCOUNTER — Other Ambulatory Visit: Payer: Self-pay | Admitting: Physician Assistant

## 2016-02-06 NOTE — Telephone Encounter (Signed)
Rx request sent to pharmacy.  

## 2016-02-09 ENCOUNTER — Other Ambulatory Visit: Payer: Self-pay | Admitting: Cardiovascular Disease

## 2016-02-22 ENCOUNTER — Ambulatory Visit: Payer: Medicare Other | Admitting: Neurology

## 2016-02-23 ENCOUNTER — Telehealth: Payer: Self-pay

## 2016-02-23 ENCOUNTER — Telehealth: Payer: Self-pay | Admitting: *Deleted

## 2016-02-23 NOTE — Telephone Encounter (Signed)
I spoke to the wife and was able to r/s to February.

## 2016-02-23 NOTE — Telephone Encounter (Signed)
Patient called back and stated that he has a bad case of constipation. Last BM was last night but was difficult getting it out. Most of the days its 2-3 days in between.  Also his left 2nd toe next to big toe is red, alittle swollen and painful when he puts a shoe on. Not throbbing pain. Would like to speak with Dr. Nyoka Cowden concerning this. Please Advise.

## 2016-02-23 NOTE — Telephone Encounter (Signed)
Patient called and left message on voicemail stating he had concerns regarding Bowel Movement.  Tried calling patient back and LMOM to return call.

## 2016-02-23 NOTE — Telephone Encounter (Signed)
I called the patient, from home on Wednesday (1/17), and advised the pt that the office is closed and we will call back to reschedule (due to the snow). Patient voiced understanding.

## 2016-02-26 NOTE — Telephone Encounter (Signed)
LMOM for patient to return call.

## 2016-02-26 NOTE — Telephone Encounter (Signed)
Add to schedule at [facility] in AM 02/27/16  at 11:30.

## 2016-02-26 NOTE — Telephone Encounter (Signed)
Left message that Dr. Nyoka Cowden will see him tomorrow at [facility] clinic at 11:30. If he has any questions, or problems to call the office.

## 2016-02-27 ENCOUNTER — Encounter: Payer: Self-pay | Admitting: Internal Medicine

## 2016-02-27 ENCOUNTER — Non-Acute Institutional Stay: Payer: Medicare Other | Admitting: Internal Medicine

## 2016-02-27 VITALS — BP 102/58 | HR 69 | Temp 97.3°F | Ht 70.0 in | Wt 181.0 lb

## 2016-02-27 DIAGNOSIS — R21 Rash and other nonspecific skin eruption: Secondary | ICD-10-CM | POA: Diagnosis not present

## 2016-02-27 DIAGNOSIS — L03032 Cellulitis of left toe: Secondary | ICD-10-CM | POA: Diagnosis not present

## 2016-02-27 DIAGNOSIS — K5900 Constipation, unspecified: Secondary | ICD-10-CM | POA: Diagnosis not present

## 2016-02-27 DIAGNOSIS — G2581 Restless legs syndrome: Secondary | ICD-10-CM | POA: Diagnosis not present

## 2016-02-27 DIAGNOSIS — L0390 Cellulitis, unspecified: Secondary | ICD-10-CM | POA: Insufficient documentation

## 2016-02-27 MED ORDER — ROPINIROLE HCL 1 MG PO TABS: 1.0000 mg | ORAL_TABLET | Freq: Three times a day (TID) | ORAL | 3 refills | Status: DC

## 2016-02-27 MED ORDER — TRIAMCINOLONE ACETONIDE 0.1 % EX CREA: TOPICAL_CREAM | CUTANEOUS | 5 refills | Status: DC

## 2016-02-27 MED ORDER — MAGNESIUM SULFATE (LAXATIVE) PO GRAN: GRANULES | ORAL | 5 refills | Status: DC

## 2016-02-27 MED ORDER — SENNOSIDES-DOCUSATE SODIUM 8.6-50 MG PO TABS: ORAL_TABLET | ORAL | 5 refills | Status: DC

## 2016-02-27 MED ORDER — DOXYCYCLINE HYCLATE 100 MG PO TABS: ORAL_TABLET | ORAL | 0 refills | Status: DC

## 2016-02-27 NOTE — Progress Notes (Signed)
Facility  [facility]    Place of Service: Clinic (12)     Allergies  Allergen Reactions  . Crestor [Rosuvastatin Calcium] Other (See Comments)    Unknown reaction   . Altace [Ramipril] Other (See Comments)    Cough  . Bactrim [Sulfamethoxazole-Trimethoprim] Rash  . Penicillins Rash    Has patient had a PCN reaction causing immediate rash, facial/tongue/throat swelling, SOB or lightheadedness with hypotension: No Has patient had a PCN reaction causing severe rash involving mucus membranes or skin necrosis: Yes Has patient had a PCN reaction that required hospitalization Yes Has patient had a PCN reaction occurring within the last 10 years: No If all of the above answers are "NO", then may proceed with Cephalosporin use.   . Sulfa Antibiotics Rash    Chief Complaint  Patient presents with  . conspitation    off and on, went 3 days without BM. Here with wife  . Toe Pain    infected 2nd toe on left foot, red and sore about one week  . restless legs    getting worse  . Rash    on both arms and head    HPI:  Ulcer of the medial side of the left 2nd toe. Painful.  Constipated. Using Miralax, Amiitiza 24 mg,  and Dulcolax suppository. Also using Natural Vegetable laxative.2 tabs daily. Getting plenty of fluids; at least 50 oz qd.  Restless legs are getting worse  Itching rash with bumps that are red on both arms. Also has multiple ecchymoses on both arms. Scvalp has seborrhea capitis.  Medications: Patient's Medications  New Prescriptions   No medications on file  Previous Medications   AMIODARONE (PACERONE) 200 MG TABLET    Take 2 tablets by mouth daily for heart rhythm.   ASPIRIN EC 81 MG TABLET    Take 1 tablet (81 mg total) by mouth daily.   CHOLECALCIFEROL (VITAMIN D3) 5000 UNITS CAPS    Take 1 capsule by mouth every Monday. Reported on 02/22/2015   CLOBETASOL (TEMOVATE) 0.05 % EXTERNAL SOLUTION    Apply 1 application topically daily as needed (for itching). TO SCALP   CLONAZEPAM (KLONOPIN) 0.5 MG TABLET    One tablet up to 3 times daily for restless legs or anxiety.   CLOPIDOGREL (PLAVIX) 75 MG TABLET    Take 1 tablet (75 mg total) by mouth daily.   LEVOTHYROXINE (SYNTHROID, LEVOTHROID) 137 MCG TABLET    Take 1 tablet (137 mcg total) by mouth daily before breakfast.   LUBIPROSTONE (AMITIZA) 24 MCG CAPSULE    Take 24 mcg by mouth daily.   MIDODRINE (PROAMATINE) 2.5 MG TABLET    TAKE 1 TABLET 3 TIMES DAILY WITH MEALS. TAKE 3RD DOSE AT LEAST 6 HOURS BEFORE BEDTIME.   MIDODRINE (PROAMATINE) 5 MG TABLET    Take 1 tablet (5 mg total) by mouth 3 (three) times daily with meals. Take third dose at least 6 hours before bedtime   PANTOPRAZOLE (PROTONIX) 40 MG TABLET    TAKE 1 TABLET TWICE DAILY.   POLYETHYLENE GLYCOL (MIRALAX / GLYCOLAX) PACKET    Take 17 g by mouth daily.   PRAVASTATIN (PRAVACHOL) 40 MG TABLET    Take 1 tablet (40 mg total) by mouth daily. To lower cholesterol   ROPINIROLE (REQUIP) 0.5 MG TABLET    Take 0.5 mg by mouth. Take one tablet twice daily   SENNA PO    Take 2 tablets by mouth at bedtime as needed (for constipation).  TAMSULOSIN (FLOMAX) 0.4 MG CAPS CAPSULE    Take 0.4 mg by mouth. Take one capsule once daily to help improve urinary frequency   TORSEMIDE (DEMADEX) 20 MG TABLET    Take 20 mg by mouth. Take one tablet daily, if you gain 3 or more pounds weight. Stop when weight returns to normal.   TRAZODONE (DESYREL) 100 MG TABLET    Take one tablet by mouth nightly for rest  Modified Medications   No medications on file  Discontinued Medications   LEVOTHYROXINE (SYNTHROID, LEVOTHROID) 112 MCG TABLET    TAKE 1 TABLET ONCE DAILY.     Review of Systems  Constitutional: Positive for fatigue.  HENT: Positive for hearing loss and tinnitus.   Eyes: Negative for photophobia and pain.  Respiratory: Positive for shortness of breath. Negative for cough.   Cardiovascular: Positive for leg swelling (2+ and usingcompression hosiery). Negative for  chest pain.       Congestive cardiomyopathy. Coronary artery disease, status post CABG. Cardiac arrhythmias, status post pacemaker implanted left upper chest.  Gastrointestinal: Positive for constipation. Negative for abdominal pain, diarrhea, nausea and vomiting.  Genitourinary: Positive for enuresis and frequency. Negative for hematuria.       Double voids. Urgency. Frequency. Nocturia x 7.  Musculoskeletal: Positive for gait problem (using rolloing walker). Negative for back pain, myalgias and neck pain.       Ambulates with walker.   Skin: Positive for rash (both arms. Seborrhea capitis.) and wound (left 2nd toe medial side ulcereation and swelling and erythema of the entire toe.).       Chronic medial right buttock pressure ulcer.  Neurological: Positive for dizziness, speech difficulty (sometimes has difficulty with word finding) and weakness. Negative for tremors.       MRI brain disclosed cerebral atrophy and microvascular disease.  Insomnia and restless legs are improved. Patient reports that he has H/O dizziness. This makes the patient feel unbalanced. R hand numbness.  Cognitive decline since 2014. Restless legs day and night.  Psychiatric/Behavioral: Positive for sleep disturbance (nocturia x 7). Negative for hallucinations and suicidal ideas. The patient is not nervous/anxious.        Not enough sleep, decreased energy     Vitals:   02/27/16 1140  BP: (!) 102/58  Pulse: 69  Temp: 97.3 F (36.3 C)  TempSrc: Oral  SpO2: 97%  Weight: 181 lb (82.1 kg)  Height: _0  (1.778 m)   Wt Readings from Last 3 Encounters:  02/27/16 181 lb (82.1 kg)  01/16/16 182 lb (82.6 kg)  12/22/15 183 lb 9.6 oz (83.3 kg)    Body mass index is 25.97 kg/m.  Physical Exam  Constitutional: He is oriented to person, place, and time.  Frail.  HENT:  Head: Normocephalic and atraumatic.  Right Ear: External ear normal.  Left Ear: External ear normal.  Eyes: Conjunctivae and EOM are normal.  Pupils are equal, round, and reactive to light. Right eye exhibits no discharge. Left eye exhibits no discharge.  Neck: Normal range of motion. Neck supple. No JVD present. No tracheal deviation present. No thyromegaly present.  Cardiovascular:  Murmur heard. 5-8/0 systolic  Pulmonary/Chest: Effort normal and breath sounds normal. No respiratory distress. He has no wheezes. He has no rales.  Abdominal: He exhibits no distension. There is no tenderness.  abd hernia  Genitourinary: No penile tenderness.  Musculoskeletal: Normal range of motion. He exhibits edema (2+ bipedal). He exhibits no tenderness.  Gait disturbance and loss of balance. Patient arrives  in a  wheelchair today.  Neurological: He is alert and oriented to person, place, and time. He has normal reflexes. No cranial nerve deficit. He exhibits normal muscle tone. Coordination normal.  Skin: Skin is warm and dry. No rash noted. No erythema.  Ecchymoses of both arms. Red bumps that are pruritic on both arms. Seborrhea of the scalp. Cellulitis of the left 2nd toe. Ulcer medially of the left 2nd toe.  Psychiatric: He has a normal mood and affect. His behavior is normal. Judgment and thought content normal.     Labs reviewed: Lab Summary Latest Ref Rng & Units 01/11/2016 01/10/2016 01/10/2016 12/07/2015  Hemoglobin 13.0 - 17.0 g/dL 13.9 14.3 14.1 (None)  Hematocrit 39.0 - 52.0 % 42.2 42.0 42.7 (None)  White count 4.0 - 10.5 K/uL 6.4 (None) 6.0 (None)  Platelet count 150 - 400 K/uL 188 (None) 190 (None)  Sodium 135 - 145 mmol/L 140 142 140 (None)  Potassium 3.5 - 5.1 mmol/L 4.1 3.9 4.0 (None)  Calcium 8.9 - 10.3 mg/dL 8.8(L) (None) 9.0 (None)  Phosphorus - (None) (None) (None) (None)  Creatinine 0.61 - 1.24 mg/dL 0.79 0.80 0.84 (None)  AST 15 - 41 U/L (None) (None) 25 (None)  Alk Phos 38 - 126 U/L (None) (None) 68 (None)  Bilirubin 0.3 - 1.2 mg/dL (None) (None) 0.4 (None)  Glucose 65 - 99 mg/dL 97 99 106(H) (None)  Cholesterol 0  - 200 mg/dL (None) (None) (None) 84  HDL cholesterol >40 mg/dL (None) (None) (None) 41  Triglycerides <150 mg/dL (None) (None) (None) 102  LDL Direct - (None) (None) (None) (None)  LDL Calc 0 - 99 mg/dL (None) (None) (None) 23  Total protein 6.5 - 8.1 g/dL (None) (None) 6.4(L) (None)  Albumin 3.5 - 5.0 g/dL (None) (None) 3.5 (None)  Some recent data might be hidden   Lab Results  Component Value Date   TSH 2.58 11/27/2015   Lab Results  Component Value Date   BUN 13 01/11/2016   BUN 15 01/10/2016   BUN 14 01/10/2016   Lab Results  Component Value Date   CREATININE 0.79 01/11/2016   CREATININE 0.80 01/10/2016   CREATININE 0.84 01/10/2016   Lab Results  Component Value Date   HGBA1C 5.6 12/07/2015   HGBA1C 5.9 04/20/2015   HGBA1C 5.8 02/16/2015    Assessment/Plan  1. Cellulitis of toe of left foot -soak twice daily in Epsom salts. Get tubular toe bandage. - doxycycline (VIBRA-TABS) 100 MG tablet; One twice daily for infection  Dispense: 20 tablet; Refill: 0 - magnesium sulfate (EPSOM SALT) GRAN; Mix 2 oz in warm water to use as a soak  Dispense: 1810 g; Refill: 5  2. RLS (restless legs syndrome) - rOPINIRole (REQUIP) 1 MG tablet; Take 1 tablet (1 mg total) by mouth 3 (three) times daily.  Dispense: 180 tablet; Refill: 3  3. Constipation, unspecified constipation type -Fiber supplement: cereal, bran muffin, etc -Continue Amitiza and Miralax - senna-docusate (SENOKOT S) 8.6-50 MG tablet; 4 tablets nightly to prevent constipation  Dispense: 120 tablet; Refill: 5  4. Rash and nonspecific skin eruption - triamcinolone cream (KENALOG) 0.1 %; Apply sparingly to irritated area twice daily  Dispense: 80 g; Refill: 5. May use sparingly on arms and scalp.

## 2016-02-27 NOTE — Addendum Note (Signed)
Addended by: Estill Dooms on: 02/27/2016 12:52 PM   Modules accepted: Orders

## 2016-03-01 MED ORDER — CIPROFLOXACIN HCL 500 MG PO TABS: 500.0000 mg | ORAL_TABLET | Freq: Two times a day (BID) | ORAL | 0 refills | Status: DC

## 2016-03-01 NOTE — Addendum Note (Signed)
Addended by: Denyse Amass on: 03/01/2016 03:27 PM   Modules accepted: Orders

## 2016-03-02 LAB — WOUND CULTURE
Gram Stain: NONE SEEN
Gram Stain: NONE SEEN

## 2016-03-04 ENCOUNTER — Encounter: Payer: Self-pay | Admitting: Cardiovascular Disease

## 2016-03-04 ENCOUNTER — Ambulatory Visit (INDEPENDENT_AMBULATORY_CARE_PROVIDER_SITE_OTHER): Payer: Medicare Other | Admitting: Cardiovascular Disease

## 2016-03-04 VITALS — BP 110/64 | HR 68 | Ht 70.0 in | Wt 182.0 lb

## 2016-03-04 DIAGNOSIS — I951 Orthostatic hypotension: Secondary | ICD-10-CM

## 2016-03-04 DIAGNOSIS — Z8673 Personal history of transient ischemic attack (TIA), and cerebral infarction without residual deficits: Secondary | ICD-10-CM

## 2016-03-04 DIAGNOSIS — I5040 Unspecified combined systolic (congestive) and diastolic (congestive) heart failure: Secondary | ICD-10-CM | POA: Diagnosis not present

## 2016-03-04 DIAGNOSIS — I472 Ventricular tachycardia, unspecified: Secondary | ICD-10-CM

## 2016-03-04 DIAGNOSIS — Z5181 Encounter for therapeutic drug level monitoring: Secondary | ICD-10-CM

## 2016-03-04 DIAGNOSIS — Z950 Presence of cardiac pacemaker: Secondary | ICD-10-CM | POA: Diagnosis not present

## 2016-03-04 DIAGNOSIS — I495 Sick sinus syndrome: Secondary | ICD-10-CM

## 2016-03-04 DIAGNOSIS — I2510 Atherosclerotic heart disease of native coronary artery without angina pectoris: Secondary | ICD-10-CM

## 2016-03-04 DIAGNOSIS — Z79899 Other long term (current) drug therapy: Secondary | ICD-10-CM

## 2016-03-04 NOTE — Patient Instructions (Addendum)
Your physician recommends that you return for lab work in [place].  Dr Sallyanne Kuster recommends that you schedule a follow-up appointment in 6 months. You will receive a reminder letter in the mail two months in advance. If you don't receive a letter, please call our office to schedule the follow-up appointment.  If you need a refill on your cardiac medications before your next appointment, please call your pharmacy.

## 2016-03-04 NOTE — Progress Notes (Signed)
Patient ID: Adam [name], male   DOB: 1926-02-15, 83 y.o.   MRN: [id number]    Cardiology Office Note    Date:  03/05/2016   ID:  Adam [name], DOB 1926/12/24, MRN [id number]  PCP:  Jeanmarie Hubert, MD  Cardiologist:   [name], MD   Chief Complaint  Patient presents with  . Follow-up    PT c/o occasional SOB and dizziness    History of Present Illness:  Adam [name] is a 83 y.o. male with a long-standing history of coronary artery disease and previous bypass surgery with an extensive scar from inferior wall myocardial infarction, ischemic cardiomyopathy with combined systolic and diastolic heart failure, history of sustained symptomatic ventricular tachycardia (probably "scar VT"), paroxysmal atrial fibrillation, sinus node dysfunction with dual chamber permanent pacemaker, severe orthostatic hypotension that is felt to be neurogenic in etiology.   I asked year he had an ischemic stroke. Review of all of his pacemaker records including today's download show no evidence of atrial fibrillation.  He is currently taking ProAmatine and for the most part this is providing satisfactory compensation for his orthostatic hypotension. Previous treatment with fludrocortisone lead to heart failure decompensation. It has been a little challenging to maintain the balance between decompensation of heart failure and avoidance of falls and injuries from orthostatic hypotension. Adam [name] to have achieved a reasonable balance now. Reviewed again the importance of avoiding supine position for at least 4 hours after taking the vasoconstrictor medication.  He has CAD and is currently free of angina pectoris. He underwent bypass surgery 1996. He had placement of a stent to the SVG to RCA in 2011. His last cardiac catheterization in 2012 show that vessel to be widely patent but all his native coronary arteries are occluded and he is graft dependent. He is nuclear stress test in December 2015 showed an extensive  inferior wall scar without any areas of ischemia. in June 2016 he presented with rapid palpitations associated with near-syncope, dyspnea and extreme fatigue , but did not lose consciousness. Interrogation of his pacemaker showed relatively slow ventricular tachycardia at around 150 bpm that appear to have a regular, monomorphic electrogram. His dose of amiodarone was increased and he has not had ventricular tachycardia since. He was evaluated by Dr. Caryl Comes. After some debate, we decided that a conservative approach is most appropriate in view of his declining overall functional status and advanced age. Unfortunately has also been developing progressive dementia, primarily manifested as short-term memory loss. He is now a resident at [facility]. In November 2017 he presented with a right lower extremity weakness and was diagnosed as having an ischemic stroke. He was prescribed an oral anticoagulant due to suspicion that his stroke was related to atrial fibrillation, but atrial fibrillation has never occurred/has never been recorded by his pacemaker so this medication was stopped.  Current device interrogation: St. Jude accent DR RF device is functioning normally and has an estimated generator longevity of 1.2 years. Lead parameters remain normal. He has 99% atrial pacing and there does not appear to be any underlying atrial rhythm. He also has 60% ventricular pacing. There have been no episodes of atrial fibrillation and no episodes of ventricular tachycardia. He is very sedentary and, as expected, heart rate histogram distribution is very blunted. In an attempt to reduce the frequency of ventricular pacing, decreased his dose of amiodarone and we increased his VIP AV delay extension to 270 ms at his previous appt. This has led to  some reduction in the pacing (was as high as 73%).   Past Medical History:  Diagnosis Date  . Arthritis    "minor, back and sometimes knees" (12/15/2012)  . Bradycardia     AFib/SSS s/p St Jude PPM 04/12/2008  . CAD (coronary artery disease)    CABG (LIMA-LAD, SVG-RCA, SVG-OM in 1996).  07/2009 BMS to SVG-RCA. Cath in 04/2010 with patent stents   . Cardiomyopathy, ischemic 08/25/2012  . CHF (congestive heart failure) ([place])   . Chronic knee pain 12/03/2014  . Combined congestive systolic and diastolic heart failure ([place]) 02/02/2015   Hx EF 41%. BNP 96.8 02/21/15 Torsemide 04/06/15 Na 142, K 4.6, Bun 16, creat 0.89 04/20/14 BNP 111.7, Na 142, K 4.6, Bun 16, creat 0.9   . Coronary artery disease with hx of myocardial infarct w/o hx of CABG 12/30/2014   CABG (LIMA-LAD, SVG-RCA, SVG-OM in 1996).  07/2009 BMS to SVG-RCA. Cath in 04/2010 with patent stents   . Depression with anxiety 02/02/2015   02/21/15 Hgb A1c 5.8 03/10/15 MMSE 30/30   . Dizziness, after diuretic asscoiated with hypotension and responded to fluid bolus 06/05/2011   04/28/15 US carotid R+L normal bilateral arterial velocities.    Marland Kitchen Dyspnea 08/11/2014   Followed in Pulmonary clinic/ [facility]/ [place]  - 08/11/2014  Walked RA x 1 laps @ 185 ft each stopped due to fatigue/off balance/ slow pace/  no sob or desat  - PFT's  09/26/2014  FEV1 2.26 (85 % ) ratio 76  p no % improvement from saba with DLCO  67 % corrects to 93 % for alv volume      Since prev study 08/04/13 minimal change lung vol or dlco    . Embolic cerebral infarction ([place]) 12/06/2015  . Exertional shortness of breath    "sometimes walking" (12/15/2012)  . GERD (gastroesophageal reflux disease)   . Gout 02/09/2015  . Heart murmur    "just told I had one today" (12/15/2012)  . Hiatal hernia   . Hyperlipidemia   . Hypertension   . Hypothyroid   . Insomnia   . Melanoma of back ([place]) 1976  . Myocardial infarction 1996; 2011   "both silent" (12/15/2012)  . Orthostatic hypotension   . Osteoporosis, senile   . Pacemaker   . RBBB   . Restless leg 02/02/2015  . Right leg weakness 12/06/2015  . [facility] spotted fever   . S/P CABG x 4   . Sick sinus  syndrome ([place]) 01/31/2014  . Sustained ventricular tachycardia ([place]) 07/27/2014  . Urinary retention 12/30/2014    Past Surgical History:  Procedure Laterality Date  . CARDIAC CATHETERIZATION  04/2010   LIMA to LAD patent,SVG to OM patent,no in-stnet restenosis RCA  . CATARACT EXTRACTION W/ INTRAOCULAR LENS  IMPLANT, BILATERAL Bilateral 2012  . CORONARY ANGIOPLASTY WITH STENT PLACEMENT  07/2009   bare metal stent to SVG to the RCA  . CORONARY ARTERY BYPASS GRAFT  1996   LIMA to LAD,SVG to RCA & SVG to OM  . INSERT / REPLACE / REMOVE PACEMAKER  2010  . MELANOMA EXCISION  05/1974 X2   "taken off my back" (12/15/2012)  . NM MYOVIEW LTD  06/2011   low risk  . TONSILLECTOMY  1938  . TRANSURETHRAL RESECTION OF PROSTATE  1986  . US ECHOCARDIOGRAPHY  07/11/2009   EF 45-50%    Current Medications: Outpatient Medications Prior to Visit  Medication Sig Dispense Refill  . amiodarone (PACERONE) 200 MG tablet Take 2  tablets by mouth daily for heart rhythm. 60 tablet 3  . aspirin EC 81 MG tablet Take 1 tablet (81 mg total) by mouth daily. 30 tablet 1  . Cholecalciferol (VITAMIN D3) 5000 UNITS CAPS Take 1 capsule by mouth every Monday. Reported on 02/22/2015    . ciprofloxacin (CIPRO) 500 MG tablet Take 1 tablet (500 mg total) by mouth 2 (two) times daily. 20 tablet 0  . clobetasol (TEMOVATE) 0.05 % external solution Apply 1 application topically daily as needed (for itching). TO SCALP    . clonazePAM (KLONOPIN) 0.5 MG tablet One tablet up to 3 times daily for restless legs or anxiety. 90 tablet 1  . clopidogrel (PLAVIX) 75 MG tablet Take 1 tablet (75 mg total) by mouth daily. 30 tablet 2  . levothyroxine (SYNTHROID, LEVOTHROID) 137 MCG tablet Take 1 tablet (137 mcg total) by mouth daily before breakfast. 90 tablet 3  . lubiprostone (AMITIZA) 24 MCG capsule Take 24 mcg by mouth daily.    . magnesium sulfate (EPSOM SALT) GRAN Mix 2 oz in warm water to use as a soak 1810 g 5  . midodrine (PROAMATINE) 2.5  MG tablet TAKE 1 TABLET 3 TIMES DAILY WITH MEALS. TAKE 3RD DOSE AT LEAST 6 HOURS BEFORE BEDTIME. 90 tablet 0  . midodrine (PROAMATINE) 5 MG tablet Take 1 tablet (5 mg total) by mouth 3 (three) times daily with meals. Take third dose at least 6 hours before bedtime 270 tablet 3  . pantoprazole (PROTONIX) 40 MG tablet TAKE 1 TABLET TWICE DAILY. 60 tablet 10  . polyethylene glycol (MIRALAX / GLYCOLAX) packet Take 17 g by mouth daily. 14 each 0  . pravastatin (PRAVACHOL) 40 MG tablet Take 1 tablet (40 mg total) by mouth daily. To lower cholesterol 90 tablet 1  . rOPINIRole (REQUIP) 1 MG tablet Take 1 tablet (1 mg total) by mouth 3 (three) times daily. 180 tablet 3  . SENNA PO Take 2 tablets by mouth at bedtime as needed (for constipation).     Marland Kitchen senna-docusate (SENOKOT S) 8.6-50 MG tablet 4 tablets nightly to prevent constipation 120 tablet 5  . tamsulosin (FLOMAX) 0.4 MG CAPS capsule Take 0.4 mg by mouth. Take one capsule once daily to help improve urinary frequency    . torsemide (DEMADEX) 20 MG tablet Take 20 mg by mouth. Take one tablet daily, if you gain 3 or more pounds weight. Stop when weight returns to normal.    . traZODone (DESYREL) 100 MG tablet Take one tablet by mouth nightly for rest 30 tablet 5  . triamcinolone cream (KENALOG) 0.1 % Apply sparingly to irritated area twice daily 80 g 5   No facility-administered medications prior to visit.      Allergies:   Altace [ramipril]; Bactrim [sulfamethoxazole-trimethoprim]; Crestor [rosuvastatin calcium]; Penicillins; and Sulfa antibiotics   Social History   Social History  . Marital status: Married    Spouse name: N/A  . Number of children: 2  . Years of education: Masters   Occupational History  . Retired Company secretary -Pensions consultant    Social History Main Topics  . Smoking status: Never Smoker  . Smokeless tobacco: Never Used  . Alcohol use No  . Drug use: No  . Sexual activity: No   Other Topics Concern  . 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         Diet:Low sodium   Do you drink/eat things with caffeine? No   Marital status: Married                              What year were you married?1950   Do you live in a house, apartment, assisted living, condo, trailer, etc)?    Is it one or more stories? 1   How many persons live in your home? 2   Do you have any pets in your home? No   Current or past profession: Minister, [employer]   Do you exercise?     Very Little                                                 Type & how often:    Do you have a living will?  Yes   Do you have a DNR Form? Yes   Do you have a POA/HPOA forms? Yes     Family History:  The patient's family history includes Anuerysm in his son; Coronary artery disease in his father and mother; Diabetes in his father and mother; Heart disease in his brother, father, and mother; Lung cancer in his father.   ROS:   Please see the history of present illness.    ROS All other systems reviewed and are negative.   PHYSICAL EXAM:   VS:  BP 110/64 (BP Location: Left Arm, Patient Position: Sitting, Cuff Size: Normal)   Pulse 68   Ht 5\' 10"  (1.778 m)   Wt 82.6 kg (182 lb)   BMI 26.11 kg/m    GEN: Well nourished, well developed, in no acute distress  HEENT: normal  Neck: 4 cm elevation in JVD, goiter, carotid bruits, or masses Cardiac: Paradoxically split second heart sound, RRR; no murmurs, rubs, or gallops, no edema, healthy left subclavian pacemaker site Respiratory:  clear to auscultation bilaterally, normal work of breathing GI: soft, nontender, nondistended, + BS MS: no deformity or atrophy  Skin: warm and dry, no rash Neuro:  Alert and Oriented x 3, Strength and sensation are intact Psych: euthymic mood, full affect  Wt Readings from Last 3 Encounters:   03/05/16 83 kg (183 lb)  03/04/16 82.6 kg (182 lb)  02/27/16 82.1 kg (181 lb)      Studies/Labs Reviewed:   EKG:  EKG is not ordered today.  Device check, his presenting rhythm was atrial paced, ventricular sensed  Recent Labs: 11/27/2015: TSH 2.58 01/10/2016: ALT 22 01/11/2016: BUN 13; Creatinine, Ser 0.79; Hemoglobin 13.9; Platelets 188; Potassium 4.1; Sodium 140   Lipid Panel    Component Value Date/Time   CHOL 84 12/07/2015 0449   TRIG 102 12/07/2015 0449   HDL 41 12/07/2015 0449   CHOLHDL 2.0 12/07/2015 0449   VLDL 20 12/07/2015 0449   LDLCALC 23 12/07/2015 0449     ASSESSMENT:    1. Combined systolic and diastolic congestive heart failure, unspecified congestive heart failure chronicity ([place])   2. Orthostatic hypotension   3. Coronary artery disease involving native coronary artery of native heart without angina pectoris   4. SSS (sick sinus syndrome) ([place])   5. Pacemaker   6. VT (ventricular tachycardia) ([place])   7. History of arterial ischemic stroke  8. Encounter for monitoring amiodarone therapy      PLAN:  In order of problems listed above:  1. CHF: He has no evidence of hypervolemia on exam today, even though his weight has not changed from the last appointment. I'm not sure that he is describing orthopnea. We discussed the fact that we are not trying to achieve complete relief of edema, since this would likely lead to worsening orthostatic hypotension. He is unable to take either beta blockers or RAAS inhibitors due to severe hypotension. Need to tolerate some degree of volume load. I am concerned that the high burden of ventricular pacing may be worsening his heart failure, this is due to amiodarone which is necessary to prevent VT.  2. Orthostatic hypotension: Overall this seems to be better although he still symptoms of hypotension occasionally. We reviewed the appropriate use of ProAmatine. This medication has a short half-[name] and is probably completely  gone in 4-6 hours. It is possible that his episodes of severe dizziness occur as this medication wears off. Also reviewed the fact that the same medication may cause severe supine hypertension and he should never be taken less than 4 hours before lying down. Continue wearing compression stockings 3. CAD: He has occluded native arteries and is graft dependent (LIMA to LAD, SVG to RCA, SVG to OM; status post stent in SVG to RCA 2011, patent by cath March 2012). November 2014 nuclear study shows inferior scar without ischemia, EF 41%. It is possible that there has been interval occlusion of his saphenous vein graft to the right coronary artery in the last 4 years. In the absence of angina worsening ventricular arrhythmia, plan conservative management 4. SSS: Currently without any evidence of atrial activity.  5. PPM: normal device function. There has been some beneficial reduction in ventricular pacing after the reduction and amiodarone dose and the extension of the AV delay.  6. VT: He has not had ventricular tachycardia after we cut back on the amiodarone dose. Previous Econsultation recommended conservative management without ablation. In view of his advanced age and dementia he is not a defibrillator candidate. 7. History of CVA: His pacemaker has never shown evidence of atrial arrhythmia (flutter or fibrillation) that could explain his stroke. Anticoagulants are currently not indicated. Continue clopidogrel. 8. Amiodarone: Check labs in May. Reminded him of the need for protection from excessive sun exposure and the need for yearly eye exam. He should promptly report any respiratory symptoms.    Medication Adjustments/Labs and Tests Ordered: Current medicines are reviewed at length with the patient today.  Concerns regarding medicines are outlined above.  Medication changes, Labs and Tests ordered today are listed in the Patient Instructions below. Patient Instructions  Your physician recommends that  you return for lab work in [place].  Dr Sallyanne Kuster recommends that you schedule a follow-up appointment in 6 months. You will receive a reminder letter in the mail two months in advance. If you don't receive a letter, please call our office to schedule the follow-up appointment.  If you need a refill on your cardiac medications before your next appointment, please call your pharmacy.    Signed, [name], MD  03/05/2016 1:53 PM    [facility] [address], [place], [place]  [address] Phone: [phone]; Fax: [phone]

## 2016-03-05 ENCOUNTER — Encounter: Payer: Self-pay | Admitting: Internal Medicine

## 2016-03-05 ENCOUNTER — Non-Acute Institutional Stay: Payer: Medicare Other | Admitting: Internal Medicine

## 2016-03-05 VITALS — BP 100/58 | HR 69 | Temp 97.3°F | Ht 70.0 in | Wt 183.0 lb

## 2016-03-05 DIAGNOSIS — Z8673 Personal history of transient ischemic attack (TIA), and cerebral infarction without residual deficits: Secondary | ICD-10-CM | POA: Insufficient documentation

## 2016-03-05 DIAGNOSIS — R21 Rash and other nonspecific skin eruption: Secondary | ICD-10-CM | POA: Diagnosis not present

## 2016-03-05 DIAGNOSIS — G2581 Restless legs syndrome: Secondary | ICD-10-CM

## 2016-03-05 DIAGNOSIS — K5900 Constipation, unspecified: Secondary | ICD-10-CM

## 2016-03-05 DIAGNOSIS — L03032 Cellulitis of left toe: Secondary | ICD-10-CM

## 2016-03-05 NOTE — Progress Notes (Signed)
Facility  [facility]    Place of Service: Clinic (12)     Allergies  Allergen Reactions  . Altace [Ramipril] Other (See Comments)    Cough  . Bactrim [Sulfamethoxazole-Trimethoprim] Rash  . Crestor [Rosuvastatin Calcium] Rash  . Penicillins Rash    Has patient had a PCN reaction causing immediate rash, facial/tongue/throat swelling, SOB or lightheadedness with hypotension: No Has patient had a PCN reaction causing severe rash involving mucus membranes or skin necrosis: Yes Has patient had a PCN reaction that required hospitalization Yes Has patient had a PCN reaction occurring within the last 10 years: No If all of the above answers are "NO", then may proceed with Cephalosporin use.   . Sulfa Antibiotics Rash    Chief Complaint  Patient presents with  . Medical Management of Chronic Issues    one week follow up on restless legs, cellulitis toe left foot, constipation, rash. Here with wife    HPI:  RLS (restless legs syndrome) - not much improved, but he is not taking the Mirapex as recommended last visit.  Cellulitis of toe of left foot - improving. Infected with pseudomonas aeroginosa which required a changed in antibiotic to ciprofloxacin. He is soaking daily in Epsom salts. Pain is better.  Constipation, unspecified constipation type - not nuch improved, but he is not using medications as I recommended last visit,.  Rash and nonspecific skin eruption - imrproved on scalp and extemities    Medications: Patient's Medications  New Prescriptions   No medications on file  Previous Medications   AMIODARONE (PACERONE) 200 MG TABLET    Take 2 tablets by mouth daily for heart rhythm.   ASPIRIN EC 81 MG TABLET    Take 1 tablet (81 mg total) by mouth daily.   CHOLECALCIFEROL (VITAMIN D3) 5000 UNITS CAPS    Take 1 capsule by mouth every Monday. Reported on 02/22/2015   CIPROFLOXACIN (CIPRO) 500 MG TABLET    Take 1 tablet (500 mg total) by mouth 2 (two) times daily.   CLOBETASOL  (TEMOVATE) 0.05 % EXTERNAL SOLUTION    Apply 1 application topically daily as needed (for itching). TO SCALP   CLONAZEPAM (KLONOPIN) 0.5 MG TABLET    One tablet up to 3 times daily for restless legs or anxiety.   CLOPIDOGREL (PLAVIX) 75 MG TABLET    Take 1 tablet (75 mg total) by mouth daily.   LEVOTHYROXINE (SYNTHROID, LEVOTHROID) 137 MCG TABLET    Take 1 tablet (137 mcg total) by mouth daily before breakfast.   LUBIPROSTONE (AMITIZA) 24 MCG CAPSULE    Take 24 mcg by mouth daily.   MAGNESIUM SULFATE (EPSOM SALT) GRAN    Mix 2 oz in warm water to use as a soak   MIDODRINE (PROAMATINE) 2.5 MG TABLET    TAKE 1 TABLET 3 TIMES DAILY WITH MEALS. TAKE 3RD DOSE AT LEAST 6 HOURS BEFORE BEDTIME.   MIDODRINE (PROAMATINE) 5 MG TABLET    Take 1 tablet (5 mg total) by mouth 3 (three) times daily with meals. Take third dose at least 6 hours before bedtime   PANTOPRAZOLE (PROTONIX) 40 MG TABLET    TAKE 1 TABLET TWICE DAILY.   POLYETHYLENE GLYCOL (MIRALAX / GLYCOLAX) PACKET    Take 17 g by mouth daily.   PRAVASTATIN (PRAVACHOL) 40 MG TABLET    Take 1 tablet (40 mg total) by mouth daily. To lower cholesterol   ROPINIROLE (REQUIP) 1 MG TABLET    Take 1 tablet (1 mg total)  by mouth 3 (three) times daily.   SENNA PO    Take 2 tablets by mouth at bedtime as needed (for constipation).    SENNA-DOCUSATE (SENOKOT S) 8.6-50 MG TABLET    4 tablets nightly to prevent constipation   TAMSULOSIN (FLOMAX) 0.4 MG CAPS CAPSULE    Take 0.4 mg by mouth. Take one capsule once daily to help improve urinary frequency   TORSEMIDE (DEMADEX) 20 MG TABLET    Take 20 mg by mouth. Take one tablet daily, if you gain 3 or more pounds weight. Stop when weight returns to normal.   TRAZODONE (DESYREL) 100 MG TABLET    Take one tablet by mouth nightly for rest   TRIAMCINOLONE CREAM (KENALOG) 0.1 %    Apply sparingly to irritated area twice daily  Modified Medications   No medications on file  Discontinued Medications   No medications on file      Review of Systems  Constitutional: Positive for appetite change and fatigue.  HENT: Positive for hearing loss and tinnitus.   Eyes: Negative for photophobia and pain.  Respiratory: Positive for shortness of breath. Negative for cough.   Cardiovascular: Positive for leg swelling (2+ and usingcompression hosiery). Negative for chest pain.       Congestive cardiomyopathy. Coronary artery disease, status post CABG. Cardiac arrhythmias, status post pacemaker implanted left upper chest.  Gastrointestinal: Positive for constipation. Negative for abdominal pain, diarrhea, nausea and vomiting.  Genitourinary: Positive for enuresis and frequency. Negative for hematuria.       Double voids. Urgency. Frequency. Nocturia x 7.  Musculoskeletal: Positive for gait problem (using rolling walker). Negative for back pain, myalgias and neck pain.       Ambulates with walker.   Skin: Positive for rash (both arms. Seborrhea capitis.) and wound (left 2nd toe medial side ulcereation and swelling and erythema of the entire toe.).       Chronic medial right buttock pressure ulcer.  Neurological: Positive for dizziness, speech difficulty (sometimes has difficulty with word finding) and weakness. Negative for tremors.       MRI brain disclosed cerebral atrophy and microvascular disease.  Insomnia and restless legs are improved. Patient reports that he has H/O dizziness. This makes the patient feel unbalanced. R hand numbness.  Cognitive decline since 2014. Restless legs day and night.  Psychiatric/Behavioral: Positive for sleep disturbance (nocturia x 7). Negative for hallucinations and suicidal ideas. The patient is not nervous/anxious.        Not enough sleep, decreased energy     Vitals:   03/05/16 1205  BP: (!) 100/58  Pulse: 69  Temp: 97.3 F (36.3 C)  TempSrc: Oral  SpO2: 96%  Weight: 183 lb (83 kg)  Height: '5\' 10"'  (1.778 m)   Wt Readings from Last 3 Encounters:  03/05/16 183 lb (83 kg)   03/04/16 182 lb (82.6 kg)  02/27/16 181 lb (82.1 kg)    Body mass index is 26.26 kg/m.  Physical Exam  Constitutional: He is oriented to person, place, and time.  Frail.  HENT:  Head: Normocephalic and atraumatic.  Right Ear: External ear normal.  Left Ear: External ear normal.  Eyes: Conjunctivae and EOM are normal. Pupils are equal, round, and reactive to light. Right eye exhibits no discharge. Left eye exhibits no discharge.  Neck: Normal range of motion. Neck supple. No JVD present. No tracheal deviation present. No thyromegaly present.  Cardiovascular:  Murmur heard. 4-9/2 systolic  Pulmonary/Chest: Effort normal and breath sounds normal. No  respiratory distress. He has no wheezes. He has no rales.  Abdominal: He exhibits no distension. There is no tenderness.  abd hernia  Genitourinary: No penile tenderness.  Musculoskeletal: Normal range of motion. He exhibits edema (2+ bipedal). He exhibits no tenderness.  Gait disturbance and loss of balance. Patient arrives in a  wheelchair today.  Neurological: He is alert and oriented to person, place, and time. He has normal reflexes. No cranial nerve deficit. He exhibits normal muscle tone. Coordination normal.  Skin: Skin is warm and dry. No rash noted. No erythema.  Ecchymoses of both arms. Improvement of the red bumps that are pruritic on both arms. Seborrhea of the scalp. Cellulitis of the left 2nd toe. Ulcer medially of the left 2nd toe.  Psychiatric: He has a normal mood and affect. His behavior is normal. Judgment and thought content normal.     Labs reviewed: Lab Summary Latest Ref Rng & Units 01/11/2016 01/10/2016 01/10/2016 12/07/2015  Hemoglobin 13.0 - 17.0 g/dL 13.9 14.3 14.1 (None)  Hematocrit 39.0 - 52.0 % 42.2 42.0 42.7 (None)  White count 4.0 - 10.5 K/uL 6.4 (None) 6.0 (None)  Platelet count 150 - 400 K/uL 188 (None) 190 (None)  Sodium 135 - 145 mmol/L 140 142 140 (None)  Potassium 3.5 - 5.1 mmol/L 4.1 3.9 4.0  (None)  Calcium 8.9 - 10.3 mg/dL 8.8(L) (None) 9.0 (None)  Phosphorus - (None) (None) (None) (None)  Creatinine 0.61 - 1.24 mg/dL 0.79 0.80 0.84 (None)  AST 15 - 41 U/L (None) (None) 25 (None)  Alk Phos 38 - 126 U/L (None) (None) 68 (None)  Bilirubin 0.3 - 1.2 mg/dL (None) (None) 0.4 (None)  Glucose 65 - 99 mg/dL 97 99 106(H) (None)  Cholesterol 0 - 200 mg/dL (None) (None) (None) 84  HDL cholesterol >40 mg/dL (None) (None) (None) 41  Triglycerides <150 mg/dL (None) (None) (None) 102  LDL Direct - (None) (None) (None) (None)  LDL Calc 0 - 99 mg/dL (None) (None) (None) 23  Total protein 6.5 - 8.1 g/dL (None) (None) 6.4(L) (None)  Albumin 3.5 - 5.0 g/dL (None) (None) 3.5 (None)  Some recent data might be hidden   Lab Results  Component Value Date   TSH 2.58 11/27/2015   Lab Results  Component Value Date   BUN 13 01/11/2016   BUN 15 01/10/2016   BUN 14 01/10/2016   Lab Results  Component Value Date   CREATININE 0.79 01/11/2016   CREATININE 0.80 01/10/2016   CREATININE 0.84 01/10/2016   Lab Results  Component Value Date   HGBA1C 5.6 12/07/2015   HGBA1C 5.9 04/20/2015   HGBA1C 5.8 02/16/2015       Assessment/Plan  1. RLS (restless legs syndrome) Use Mirapex at least every night and add additional doses as needed during the day.  2. Cellulitis of toe of left foot Improving. Continue Cipro and soaks.  3. Constipation, unspecified constipation type Use laxatives as directed  4. Rash and nonspecific skin eruption The current medical regimen is effective;  continue present plan and medications.

## 2016-03-10 ENCOUNTER — Emergency Department (HOSPITAL_COMMUNITY): Payer: Medicare Other

## 2016-03-10 ENCOUNTER — Observation Stay (HOSPITAL_COMMUNITY)
Admission: EM | Admit: 2016-03-10 | Discharge: 2016-03-11 | Disposition: A | Discharge status: Skilled Nursing Facility | Payer: Medicare Other | Attending: Internal Medicine | Admitting: Internal Medicine

## 2016-03-10 ENCOUNTER — Telehealth: Payer: Self-pay | Admitting: Nurse Practitioner

## 2016-03-10 ENCOUNTER — Encounter (HOSPITAL_COMMUNITY): Payer: Self-pay | Admitting: Emergency Medicine

## 2016-03-10 DIAGNOSIS — G2581 Restless legs syndrome: Secondary | ICD-10-CM | POA: Diagnosis not present

## 2016-03-10 DIAGNOSIS — Z950 Presence of cardiac pacemaker: Secondary | ICD-10-CM | POA: Insufficient documentation

## 2016-03-10 DIAGNOSIS — I110 Hypertensive heart disease with heart failure: Secondary | ICD-10-CM | POA: Insufficient documentation

## 2016-03-10 DIAGNOSIS — I2510 Atherosclerotic heart disease of native coronary artery without angina pectoris: Secondary | ICD-10-CM | POA: Insufficient documentation

## 2016-03-10 DIAGNOSIS — E7800 Pure hypercholesterolemia, unspecified: Secondary | ICD-10-CM | POA: Diagnosis present

## 2016-03-10 DIAGNOSIS — J069 Acute upper respiratory infection, unspecified: Principal | ICD-10-CM | POA: Insufficient documentation

## 2016-03-10 DIAGNOSIS — R339 Retention of urine, unspecified: Secondary | ICD-10-CM | POA: Insufficient documentation

## 2016-03-10 DIAGNOSIS — J111 Influenza due to unidentified influenza virus with other respiratory manifestations: Secondary | ICD-10-CM

## 2016-03-10 DIAGNOSIS — E785 Hyperlipidemia, unspecified: Secondary | ICD-10-CM | POA: Insufficient documentation

## 2016-03-10 DIAGNOSIS — J101 Influenza due to other identified influenza virus with other respiratory manifestations: Secondary | ICD-10-CM

## 2016-03-10 DIAGNOSIS — I69331 Monoplegia of upper limb following cerebral infarction affecting right dominant side: Secondary | ICD-10-CM | POA: Insufficient documentation

## 2016-03-10 DIAGNOSIS — I252 Old myocardial infarction: Secondary | ICD-10-CM | POA: Insufficient documentation

## 2016-03-10 DIAGNOSIS — K5900 Constipation, unspecified: Secondary | ICD-10-CM | POA: Insufficient documentation

## 2016-03-10 DIAGNOSIS — I959 Hypotension, unspecified: Secondary | ICD-10-CM | POA: Diagnosis present

## 2016-03-10 DIAGNOSIS — E039 Hypothyroidism, unspecified: Secondary | ICD-10-CM | POA: Insufficient documentation

## 2016-03-10 DIAGNOSIS — I255 Ischemic cardiomyopathy: Secondary | ICD-10-CM | POA: Insufficient documentation

## 2016-03-10 DIAGNOSIS — Z79899 Other long term (current) drug therapy: Secondary | ICD-10-CM | POA: Diagnosis not present

## 2016-03-10 DIAGNOSIS — G4700 Insomnia, unspecified: Secondary | ICD-10-CM | POA: Diagnosis present

## 2016-03-10 DIAGNOSIS — Z951 Presence of aortocoronary bypass graft: Secondary | ICD-10-CM | POA: Diagnosis not present

## 2016-03-10 DIAGNOSIS — Z8673 Personal history of transient ischemic attack (TIA), and cerebral infarction without residual deficits: Secondary | ICD-10-CM

## 2016-03-10 DIAGNOSIS — N179 Acute kidney failure, unspecified: Secondary | ICD-10-CM | POA: Diagnosis not present

## 2016-03-10 DIAGNOSIS — Z955 Presence of coronary angioplasty implant and graft: Secondary | ICD-10-CM | POA: Insufficient documentation

## 2016-03-10 DIAGNOSIS — I5040 Unspecified combined systolic (congestive) and diastolic (congestive) heart failure: Secondary | ICD-10-CM | POA: Diagnosis present

## 2016-03-10 DIAGNOSIS — I5042 Chronic combined systolic (congestive) and diastolic (congestive) heart failure: Secondary | ICD-10-CM | POA: Diagnosis not present

## 2016-03-10 DIAGNOSIS — N400 Enlarged prostate without lower urinary tract symptoms: Secondary | ICD-10-CM | POA: Diagnosis not present

## 2016-03-10 DIAGNOSIS — F419 Anxiety disorder, unspecified: Secondary | ICD-10-CM | POA: Insufficient documentation

## 2016-03-10 DIAGNOSIS — Z7902 Long term (current) use of antithrombotics/antiplatelets: Secondary | ICD-10-CM | POA: Diagnosis not present

## 2016-03-10 DIAGNOSIS — I495 Sick sinus syndrome: Secondary | ICD-10-CM | POA: Insufficient documentation

## 2016-03-10 DIAGNOSIS — K219 Gastro-esophageal reflux disease without esophagitis: Secondary | ICD-10-CM | POA: Insufficient documentation

## 2016-03-10 DIAGNOSIS — I951 Orthostatic hypotension: Secondary | ICD-10-CM | POA: Insufficient documentation

## 2016-03-10 DIAGNOSIS — Z7982 Long term (current) use of aspirin: Secondary | ICD-10-CM | POA: Diagnosis not present

## 2016-03-10 DIAGNOSIS — R05 Cough: Secondary | ICD-10-CM | POA: Diagnosis present

## 2016-03-10 HISTORY — DX: Influenza due to other identified influenza virus with other respiratory manifestations: J10.1

## 2016-03-10 LAB — I-STAT CG4 LACTIC ACID, ED: Lactic Acid, Venous: 1.02 mmol/L (ref 0.5–1.9)

## 2016-03-10 LAB — CBC WITH DIFFERENTIAL/PLATELET
Basophils Absolute: 0 10*3/uL (ref 0.0–0.1)
Basophils Relative: 0 %
Eosinophils Absolute: 0 10*3/uL (ref 0.0–0.7)
Eosinophils Relative: 0 %
HCT: 39.5 % (ref 39.0–52.0)
Hemoglobin: 13.1 g/dL (ref 13.0–17.0)
Lymphocytes Relative: 13 %
Lymphs Abs: 1 10*3/uL (ref 0.7–4.0)
MCH: 31.7 pg (ref 26.0–34.0)
MCHC: 33.2 g/dL (ref 30.0–36.0)
MCV: 95.6 fL (ref 78.0–100.0)
Monocytes Absolute: 1.4 10*3/uL — ABNORMAL HIGH (ref 0.1–1.0)
Monocytes Relative: 17 %
Neutro Abs: 5.8 10*3/uL (ref 1.7–7.7)
Neutrophils Relative %: 70 %
Platelets: 190 10*3/uL (ref 150–400)
RBC: 4.13 MIL/uL — ABNORMAL LOW (ref 4.22–5.81)
RDW: 15 % (ref 11.5–15.5)
WBC: 8.3 10*3/uL (ref 4.0–10.5)

## 2016-03-10 LAB — COMPREHENSIVE METABOLIC PANEL
ALT: 26 U/L (ref 17–63)
AST: 53 U/L — ABNORMAL HIGH (ref 15–41)
Albumin: 3.7 g/dL (ref 3.5–5.0)
Alkaline Phosphatase: 61 U/L (ref 38–126)
Anion gap: 7 (ref 5–15)
BUN: 22 mg/dL — ABNORMAL HIGH (ref 6–20)
CO2: 29 mmol/L (ref 22–32)
Calcium: 8.7 mg/dL — ABNORMAL LOW (ref 8.9–10.3)
Chloride: 104 mmol/L (ref 101–111)
Creatinine, Ser: 1.03 mg/dL (ref 0.61–1.24)
GFR calc Af Amer: >60 mL/min (ref >60)
GFR calc non Af Amer: >60 mL/min (ref >60)
Glucose, Bld: 96 mg/dL (ref 65–99)
Potassium: 3.5 mmol/L (ref 3.5–5.1)
Sodium: 140 mmol/L (ref 135–145)
Total Bilirubin: 0.6 mg/dL (ref 0.3–1.2)
Total Protein: 6.9 g/dL (ref 6.5–8.1)

## 2016-03-10 LAB — URINALYSIS, ROUTINE W REFLEX MICROSCOPIC
Bilirubin Urine: NEGATIVE
Glucose, UA: NEGATIVE mg/dL
Hgb urine dipstick: NEGATIVE
Ketones, ur: NEGATIVE mg/dL
Leukocytes, UA: NEGATIVE
Nitrite: NEGATIVE
Protein, ur: NEGATIVE mg/dL
Specific Gravity, Urine: 1.017 (ref 1.005–1.030)
pH: 6 (ref 5.0–8.0)

## 2016-03-10 MED ORDER — OSELTAMIVIR PHOSPHATE 75 MG PO CAPS
75.0000 mg | ORAL_CAPSULE | Freq: Two times a day (BID) | ORAL | Status: DC
  Administered 2016-03-10 – 2016-03-11 (×2): 75 mg via ORAL
  Filled 2016-03-10 (×2): qty 1

## 2016-03-10 MED ORDER — SENNOSIDES-DOCUSATE SODIUM 8.6-50 MG PO TABS: 4.0000 | ORAL_TABLET | Freq: Every day | ORAL | Status: DC | PRN

## 2016-03-10 MED ORDER — ASPIRIN EC 81 MG PO TBEC
81.0000 mg | DELAYED_RELEASE_TABLET | Freq: Every day | ORAL | Status: DC
  Administered 2016-03-11: 81 mg via ORAL
  Filled 2016-03-10: qty 1

## 2016-03-10 MED ORDER — HYDROCOD POLST-CPM POLST ER 10-8 MG/5ML PO SUER
5.0000 mL | Freq: Once | ORAL | Status: AC
  Administered 2016-03-10: 5 mL via ORAL
  Filled 2016-03-10: qty 5

## 2016-03-10 MED ORDER — MIDODRINE HCL 5 MG PO TABS
5.0000 mg | ORAL_TABLET | Freq: Three times a day (TID) | ORAL | Status: DC
  Administered 2016-03-10 – 2016-03-11 (×3): 5 mg via ORAL
  Filled 2016-03-10 (×4): qty 1

## 2016-03-10 MED ORDER — TAMSULOSIN HCL 0.4 MG PO CAPS
0.4000 mg | ORAL_CAPSULE | Freq: Every day | ORAL | Status: DC
  Administered 2016-03-10: 0.4 mg via ORAL
  Filled 2016-03-10: qty 1

## 2016-03-10 MED ORDER — ACETAMINOPHEN 325 MG PO TABS
650.0000 mg | ORAL_TABLET | Freq: Four times a day (QID) | ORAL | Status: DC | PRN
Start: 2016-03-10 — End: 2016-03-11

## 2016-03-10 MED ORDER — ONDANSETRON HCL 4 MG PO TABS: 4.0000 mg | ORAL_TABLET | Freq: Four times a day (QID) | ORAL | Status: DC | PRN

## 2016-03-10 MED ORDER — LEVOTHYROXINE SODIUM 25 MCG PO TABS
137.0000 ug | ORAL_TABLET | Freq: Every day | ORAL | Status: DC
  Administered 2016-03-11: 137 ug via ORAL
  Filled 2016-03-10: qty 1

## 2016-03-10 MED ORDER — SODIUM CHLORIDE 0.9 % IV BOLUS (SEPSIS)
1000.0000 mL | Freq: Once | INTRAVENOUS | Status: AC
  Administered 2016-03-10: 1000 mL via INTRAVENOUS

## 2016-03-10 MED ORDER — PRAVASTATIN SODIUM 40 MG PO TABS
40.0000 mg | ORAL_TABLET | Freq: Every evening | ORAL | Status: DC
  Administered 2016-03-10: 40 mg via ORAL
  Filled 2016-03-10: qty 1

## 2016-03-10 MED ORDER — ONDANSETRON HCL 4 MG/2ML IJ SOLN: 4.0000 mg | Freq: Four times a day (QID) | INTRAMUSCULAR | Status: DC | PRN

## 2016-03-10 MED ORDER — LUBIPROSTONE 24 MCG PO CAPS
24.0000 ug | ORAL_CAPSULE | Freq: Every day | ORAL | Status: DC
  Administered 2016-03-10: 24 ug via ORAL
  Filled 2016-03-10 (×2): qty 1

## 2016-03-10 MED ORDER — CLOPIDOGREL BISULFATE 75 MG PO TABS
75.0000 mg | ORAL_TABLET | Freq: Every day | ORAL | Status: DC
  Administered 2016-03-11: 75 mg via ORAL
  Filled 2016-03-10: qty 1

## 2016-03-10 MED ORDER — TRAZODONE HCL 50 MG PO TABS
50.0000 mg | ORAL_TABLET | Freq: Every day | ORAL | Status: DC
  Administered 2016-03-10: 50 mg via ORAL
  Filled 2016-03-10: qty 1

## 2016-03-10 MED ORDER — ACETAMINOPHEN 650 MG RE SUPP: 650.0000 mg | Freq: Four times a day (QID) | RECTAL | Status: DC | PRN

## 2016-03-10 MED ORDER — CLONAZEPAM 0.5 MG PO TABS: 0.5000 mg | ORAL_TABLET | Freq: Three times a day (TID) | ORAL | Status: DC | PRN

## 2016-03-10 MED ORDER — ENOXAPARIN SODIUM 40 MG/0.4ML ~~LOC~~ SOLN
40.0000 mg | SUBCUTANEOUS | Status: DC
  Administered 2016-03-10: 40 mg via SUBCUTANEOUS
  Filled 2016-03-10: qty 0.4

## 2016-03-10 MED ORDER — OSELTAMIVIR PHOSPHATE 75 MG PO CAPS: 75.0000 mg | ORAL_CAPSULE | Freq: Two times a day (BID) | ORAL | Status: DC

## 2016-03-10 MED ORDER — IPRATROPIUM-ALBUTEROL 0.5-2.5 (3) MG/3ML IN SOLN
3.0000 mL | Freq: Once | RESPIRATORY_TRACT | Status: AC
  Administered 2016-03-10: 3 mL via RESPIRATORY_TRACT
  Filled 2016-03-10: qty 3

## 2016-03-10 MED ORDER — PANTOPRAZOLE SODIUM 40 MG PO TBEC
40.0000 mg | DELAYED_RELEASE_TABLET | Freq: Every day | ORAL | Status: DC
  Administered 2016-03-10: 40 mg via ORAL
  Filled 2016-03-10: qty 1

## 2016-03-10 MED ORDER — POLYETHYLENE GLYCOL 3350 17 G PO PACK: 17.0000 g | PACK | Freq: Every day | ORAL | Status: DC | PRN

## 2016-03-10 MED ORDER — AMIODARONE HCL 200 MG PO TABS
400.0000 mg | ORAL_TABLET | Freq: Every day | ORAL | Status: DC
  Administered 2016-03-10 – 2016-03-11 (×2): 400 mg via ORAL
  Filled 2016-03-10 (×2): qty 2

## 2016-03-10 MED ORDER — ROPINIROLE HCL 0.5 MG PO TABS
0.5000 mg | ORAL_TABLET | Freq: Three times a day (TID) | ORAL | Status: DC | PRN
  Administered 2016-03-10 (×2): 0.5 mg via ORAL
  Filled 2016-03-10 (×4): qty 1

## 2016-03-10 NOTE — ED Notes (Signed)
Attempted to ambulate pt, before pt got up his O2 was 93%, pt sat up in the bed and stood up his O2 then dropped to 88% and the pt stated that he had become very dizzy.

## 2016-03-10 NOTE — ED Provider Notes (Signed)
[facility] DEPT Provider Note   CSN: [id number] Arrival date & time: 03/10/16  1256     History   Chief Complaint Chief Complaint  Patient presents with  . Influenza    HPI Adam [name] is a 83 y.o. male.  HPI Pt started having cough, fever, congestion about 3-4 days ago.  The sx persisted and this morning he had a fever up to 104.    He has been coughing a lot and also feeling a little bit short of breath. He denies any vomiting or diarrhea. He denies any chest pain. He denies any rashes.  He went to an urgent care today and they did a flu swab while he was in the parking lot. That test was positive for influenza. The patient was instructed to come to the emergency room considering his other medical problems.  Past Medical History:  Diagnosis Date  . Arthritis    "minor, back and sometimes knees" (12/15/2012)  . Bradycardia    AFib/SSS s/p St Jude PPM 04/12/2008  . CAD (coronary artery disease)    CABG (LIMA-LAD, SVG-RCA, SVG-OM in 1996).  07/2009 BMS to SVG-RCA. Cath in 04/2010 with patent stents   . Cardiomyopathy, ischemic 08/25/2012  . CHF (congestive heart failure) ([place])   . Chronic knee pain 12/03/2014  . 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 BNP 96.8 02/21/15 Torsemide 04/06/15 Na 142, K 4.6, Bun 16, creat 0.89 04/20/14 BNP 111.7, Na 142, K 4.6, Bun 16, creat 0.9   . Coronary artery disease with hx of myocardial infarct w/o hx of CABG 12/30/2014   CABG (LIMA-LAD, SVG-RCA, SVG-OM in 1996).  07/2009 BMS to SVG-RCA. Cath in 04/2010 with patent stents   . Depression with anxiety 02/02/2015   02/21/15 Hgb A1c 5.8 03/10/15 MMSE 30/30   . Dizziness, after diuretic asscoiated with hypotension and responded to fluid bolus 06/05/2011   04/28/15 US carotid R+L normal bilateral arterial velocities.    Marland Kitchen Dyspnea 08/11/2014   Followed in Pulmonary clinic/ [facility]/ [facility]  - 08/11/2014  Walked RA x 1 laps @ 185 ft each stopped due to  fatigue/off balance/ slow pace/  no sob or desat  - PFT's  09/26/2014  FEV1 2.26 (85 % ) ratio 76  p no % improvement from saba with DLCO  67 % corrects to 93 % for alv volume      Since prev study 08/04/13 minimal change lung vol or dlco    . Embolic cerebral infarction ([place]) 12/06/2015  . Exertional shortness of breath    "sometimes walking" (12/15/2012)  . GERD (gastroesophageal reflux disease)   . Gout 02/09/2015  . Heart murmur    "just told I had one today" (12/15/2012)  . Hiatal hernia   . Hyperlipidemia   . Hypertension   . Hypothyroid   . Insomnia   . Melanoma of back ([place]) 1976  . Myocardial infarction 1996; 2011   "both silent" (12/15/2012)  . Orthostatic hypotension   . Osteoporosis, senile   . Pacemaker   . RBBB   . Restless leg 02/02/2015  . Right leg weakness 12/06/2015  . [facility] spotted fever   . S/P CABG x 4   . Sick sinus syndrome ([place]) 01/31/2014  . Sustained ventricular tachycardia ([place]) 07/27/2014  . Urinary retention 12/30/2014    Patient Active Problem List   Diagnosis Date Noted  . History of arterial ischemic stroke 03/05/2016  . Cellulitis 02/27/2016  .  Rash and nonspecific skin eruption 02/27/2016  . Chronic systolic heart failure ([place])   . Nonrheumatic aortic valve stenosis   . TIA (transient ischemic attack) 01/10/2016  . Stroke-like symptoms 01/10/2016  . Atypical chest pain 01/10/2016  . Hypotension 01/10/2016  . Hx of CABG 01/10/2016  . Chronic anticoagulation 12/22/2015  . Embolic cerebral infarction ([place]) 12/06/2015  . Urinary tract infection 04/06/2015  . Insomnia 03/23/2015  . Gout 02/09/2015  . Depression with anxiety 02/02/2015  . RLS (restless legs syndrome) 02/02/2015  . Combined congestive systolic and diastolic heart failure ([place]) 02/02/2015  . Urinary frequency 02/02/2015  . Pressure ulcer 01/01/2015  . Urinary retention 12/30/2014  . Coronary artery disease involving native coronary artery of native heart without angina  pectoris 12/30/2014  . Expressive aphasia 12/30/2014  . Constipation   . Orthostatic hypotension   . Chronic knee pain 12/03/2014  . GERD (gastroesophageal reflux disease)   . Hypertension   . Dyspnea 08/11/2014  . Hypothyroidism 08/11/2014  . VT (ventricular tachycardia) ([place]) 07/27/2014  . SSS (sick sinus syndrome) ([place]) 01/31/2014  . Hyperlipidemia 01/30/2014  . Cardiomyopathy, ischemic 08/25/2012  . Pacemaker 08/25/2012  . Dizzy 06/05/2011  . Bradycardia     Past Surgical History:  Procedure Laterality Date  . CARDIAC CATHETERIZATION  04/2010   LIMA to LAD patent,SVG to OM patent,no in-stnet restenosis RCA  . CATARACT EXTRACTION W/ INTRAOCULAR LENS  IMPLANT, BILATERAL Bilateral 2012  . CORONARY ANGIOPLASTY WITH STENT PLACEMENT  07/2009   bare metal stent to SVG to the RCA  . CORONARY ARTERY BYPASS GRAFT  1996   LIMA to LAD,SVG to RCA & SVG to OM  . INSERT / REPLACE / REMOVE PACEMAKER  2010  . MELANOMA EXCISION  05/1974 X2   "taken off my back" (12/15/2012)  . NM MYOVIEW LTD  06/2011   low risk  . TONSILLECTOMY  1938  . TRANSURETHRAL RESECTION OF PROSTATE  1986  . US ECHOCARDIOGRAPHY  07/11/2009   EF 45-50%       Home Medications    Prior to Admission medications   Medication Sig Start Date End Date Taking? Authorizing Provider  amiodarone (PACERONE) 200 MG tablet Take 400 mg by mouth daily.   Yes Historical Provider, MD  aspirin EC 81 MG tablet Take 1 tablet (81 mg total) by mouth daily. 01/12/16  Yes Reyne Dumas, MD  Cholecalciferol (VITAMIN D3) 5000 UNITS CAPS Take 5,000 Units by mouth every Monday.    Yes Historical Provider, MD  ciprofloxacin (CIPRO) 500 MG tablet Take 1 tablet (500 mg total) by mouth 2 (two) times daily. 03/01/16  Yes Estill Dooms, MD  clobetasol (TEMOVATE) 0.05 % external solution Apply 1 application topically as needed (for itchy scalp).    Yes Historical Provider, MD  clonazePAM (KLONOPIN) 0.5 MG tablet Take 0.5 mg by mouth 3 (three) times  daily as needed for anxiety.   Yes Historical Provider, MD  clopidogrel (PLAVIX) 75 MG tablet Take 1 tablet (75 mg total) by mouth daily. 01/12/16  Yes Reyne Dumas, MD  levothyroxine (SYNTHROID, LEVOTHROID) 137 MCG tablet Take 1 tablet (137 mcg total) by mouth daily before breakfast. 01/05/16  Yes Gildardo Cranker, DO  lubiprostone (AMITIZA) 24 MCG capsule Take 24 mcg by mouth at bedtime.    Yes Historical Provider, MD  Magnesium Sulfate (EPSOM SALT) POWD 1 application by Does not apply route at bedtime. Pt is to soak feet once daily.   Yes Historical Provider, MD  midodrine (PROAMATINE) 5 MG  tablet Take 5 mg by mouth 3 (three) times daily with meals.   Yes Historical Provider, MD  pantoprazole (PROTONIX) 40 MG tablet Take 40 mg by mouth at bedtime.   Yes Historical Provider, MD  polyethylene glycol (MIRALAX / GLYCOLAX) packet Take 17 g by mouth daily as needed for mild constipation.   Yes Historical Provider, MD  pravastatin (PRAVACHOL) 40 MG tablet Take 40 mg by mouth daily at 12 noon.   Yes Historical Provider, MD  rOPINIRole (REQUIP) 0.5 MG tablet Take 0.5 mg by mouth 3 (three) times daily as needed (for restless leg syndrome).   Yes Historical Provider, MD  senna-docusate (SENOKOT-S) 8.6-50 MG tablet Take 4 tablets by mouth daily as needed for mild constipation.   Yes Historical Provider, MD  tamsulosin (FLOMAX) 0.4 MG CAPS capsule Take 0.4 mg by mouth at bedtime.    Yes Historical Provider, MD  torsemide (DEMADEX) 20 MG tablet Take 20 mg by mouth daily as needed (for weight gain of 3 or more lbs).    Yes Historical Provider, MD  traZODone (DESYREL) 50 MG tablet Take 50 mg by mouth at bedtime.   Yes Historical Provider, MD  triamcinolone cream (KENALOG) 0.1 % Apply 1 application topically 2 (two) times daily as needed (for irritation).   Yes Historical Provider, MD    Family History Family History  Problem Relation Age of Onset  . Coronary artery disease Mother   . Diabetes Mother   . Heart  disease Mother   . Coronary artery disease Father   . Diabetes Father   . Lung cancer Father   . Heart disease Father   . Anuerysm Son   . Heart disease Brother     Social History Social History  Substance Use Topics  . Smoking status: Never Smoker  . Smokeless tobacco: Never Used  . Alcohol use No     Allergies   Altace [ramipril]; Bactrim [sulfamethoxazole-trimethoprim]; Crestor [rosuvastatin calcium]; Penicillins; and Sulfa antibiotics   Review of Systems Review of Systems  All other systems reviewed and are negative.    Physical Exam Updated Vital Signs BP (!) 107/51   Pulse 70   Temp 97.4 F (36.3 C)   Resp 17   Ht 5\' 10"  (1.778 m)   Wt 79.4 kg   SpO2 98%   BMI 25.11 kg/m   Physical Exam  Constitutional: No distress.  HENT:  Head: Normocephalic and atraumatic.  Right Ear: External ear normal.  Left Ear: External ear normal.  Mouth/Throat: No oropharyngeal exudate.  Eyes: Conjunctivae are normal. Right eye exhibits no discharge. Left eye exhibits no discharge. No scleral icterus.  Neck: Neck supple. No tracheal deviation present.  Cardiovascular: Normal rate, regular rhythm and intact distal pulses.   Pulmonary/Chest: Effort normal. No stridor. No respiratory distress. He has wheezes ( few wheezes). He has no rales.  Abdominal: Soft. Bowel sounds are normal. He exhibits no distension. There is no tenderness. There is no rebound and no guarding.  Musculoskeletal: He exhibits no edema or tenderness.  Neurological: He is alert. He has normal strength. No cranial nerve deficit (no facial droop, extraocular movements intact, no slurred speech) or sensory deficit. He exhibits normal muscle tone. He displays no seizure activity. Coordination normal.  Skin: Skin is warm and dry. No rash noted. He is not diaphoretic.  Psychiatric: He has a normal mood and affect.  Nursing note and vitals reviewed.    ED Treatments / Results  Labs (all labs ordered are listed,  but only abnormal results are displayed) Labs Reviewed  COMPREHENSIVE METABOLIC PANEL - Abnormal; Notable for the following:       Result Value   BUN 22 (*)    Calcium 8.7 (*)    AST 53 (*)    All other components within normal limits  CBC WITH DIFFERENTIAL/PLATELET - Abnormal; Notable for the following:    RBC 4.13 (*)    Monocytes Absolute 1.4 (*)    All other components within normal limits  CULTURE, BLOOD (ROUTINE X 2)  CULTURE, BLOOD (ROUTINE X 2)  URINE CULTURE  URINALYSIS, ROUTINE W REFLEX MICROSCOPIC  I-STAT CG4 LACTIC ACID, ED  I-STAT CG4 LACTIC ACID, ED    EKG  EKG Interpretation None       Radiology Dg Chest 2 View  Result Date: 03/10/2016 CLINICAL DATA:  Cough.  Influenza EXAM: CHEST  2 VIEW COMPARISON:  01/11/2016 FINDINGS: Large hiatal hernia. Increased streaky opacity at the left base around the hernia. Chronic cardiopericardial enlargement. Dual-chamber pacer leads from the left. Status post CABG. No edema, effusion, or pneumothorax. IMPRESSION: 1. Increased atelectasis around the patient's large hiatal hernia. No definitive pneumonia. 2. Cardiomegaly without failure. Electronically Signed   By: Monte Fantasia M.D.   On: 03/10/2016 14:35    Procedures Procedures (including critical care time)  Medications Ordered in ED Medications  oseltamivir (TAMIFLU) capsule 75 mg (not administered)  sodium chloride 0.9 % bolus 1,000 mL (0 mLs Intravenous Stopped 03/10/16 1605)  ipratropium-albuterol (DUONEB) 0.5-2.5 (3) MG/3ML nebulizer solution 3 mL (3 mLs Nebulization Given 03/10/16 1452)     Initial Impression / Assessment and Plan / ED Course  I have reviewed the triage vital signs and the nursing notes.  Pertinent labs & imaging results that were available during my care of the patient were reviewed by me and considered in my medical decision making (see chart for details).  Clinical Course as of Mar 10 1648  Nancy Fetter Mar 10, 2016  1411 Pt diagnosed with influenza  today.  Will hold off on abx, considering dx of influenza.  Initial BP was low but repeat prior to any intervention is normal.  Will give one liter bolus and monitor closesly  [JK]  1616 Doubt sepsis.  BP has improved.  LActic acid is normal  [JK]  1617 No pna on xray.  Suspect sx related to influenza  [JK]  1646 Discussed possible outpatient treatment.  Plan was for ambulation tests first.  Pt was unable to sit up in bed without oxygen saturation dropping into the 80s.  He felt too weak to stand.  Will consutl with medical service for admission  [JK]    Clinical Course User Index [JK] Dorie Rank, MD    Final Clinical Impressions(s) / ED Diagnoses   Final diagnoses:  Influenza    New Prescriptions New Prescriptions   No medications on file     Dorie Rank, MD 03/10/16 1650

## 2016-03-10 NOTE — ED Triage Notes (Signed)
Pt tested positive for flu this morning at [facility] and was sent her due to co morbidities . Pt speaking in full sentences. C/o fatigue and has not been able to eat since last night.

## 2016-03-10 NOTE — Telephone Encounter (Signed)
   Pts wife called stating that beginning last night, he has been very weak.  This am, he is congested and coughing - yellow/green sputum.  Also w/ fever 100.4.  I rec that he needs to be eval today - urgent care vs ER.  Given hx and profound weakness, he may be best served in ED as admission is more likely.  Caller verbalized understanding and was grateful for the call back.  Murray Hodgkins, NP 03/10/2016, 10:54 AM

## 2016-03-10 NOTE — H&P (Signed)
History and Physical    Adam [name] [id number] DOB: Feb 18, 1926 DOA: 03/10/2016  PCP: Jeanmarie Hubert, MD  Patient coming from independent living  Chief Complaint: Flulike symptoms  HPI: Adam [name] is a 83 y.o. male with medical history significant of coronary artery disease status post CABG, chronic combined systolic and diastolic CHF, orthostatic hypotension, bradycardia/SSS status post pacemaker, prior history of CVA, presents to the emergency room brought in from urgent care where he went with flulike symptoms. Over the last 3-4 days, patient has been having upper respiratory symptoms and sinus congestion, and today has been having a fever of 100.4 at home, and he was was extremely weak to the point that he is barely able to walk. Family brought him to urgent care, where he underwent a flu swab and tested positive for influenza. Given chronic medical problems patient was directed to the emergency room for evaluation in case needed to be admitted. He denies any chest pains, complains of generalized weakness and dizziness when he stands up and walks around. He has no abdominal pain, had mild nausea earlier on which is now resolved, denies any vomiting. He complains of lower extremity swelling, which is chronic and has not gotten any worse. He has no abdominal pain. He denies any diarrhea. He has no sick contacts.  ED Course: In the emergency room, his initial blood pressure was low and he triggered code sepsis, however his blood pressure normalized quickly, he is afebrile, his lactic acid came back normal and he had no leukocytosis. He underwent a chest x-ray which is negative for pneumonia. His blood work is essentially normal. He was considered to be discharged home from the emergency room, and was asked to ambulate, however with ambulation he was extremely weak and unable to do much, as well as he had an oxygen saturation in the mid 80s so TR[facility] was asked for admission for influenza-like  illness.  Review of Systems: As per HPI otherwise 10 point review of systems negative.   Past Medical History:  Diagnosis Date  . Arthritis    "minor, back and sometimes knees" (12/15/2012)  . Bradycardia    AFib/SSS s/p St Jude PPM 04/12/2008  . CAD (coronary artery disease)    CABG (LIMA-LAD, SVG-RCA, SVG-OM in 1996).  07/2009 BMS to SVG-RCA. Cath in 04/2010 with patent stents   . Cardiomyopathy, ischemic 08/25/2012  . CHF (congestive heart failure) ([place])   . Chronic knee pain 12/03/2014  . Combined congestive systolic and diastolic heart failure ([place]) 02/02/2015   Hx EF 41%. BNP 96.8 02/21/15 Torsemide 04/06/15 Na 142, K 4.6, Bun 16, creat 0.89 04/20/14 BNP 111.7, Na 142, K 4.6, Bun 16, creat 0.9   . Coronary artery disease with hx of myocardial infarct w/o hx of CABG 12/30/2014   CABG (LIMA-LAD, SVG-RCA, SVG-OM in 1996).  07/2009 BMS to SVG-RCA. Cath in 04/2010 with patent stents   . Depression with anxiety 02/02/2015   02/21/15 Hgb A1c 5.8 03/10/15 MMSE 30/30   . Dizziness, after diuretic asscoiated with hypotension and responded to fluid bolus 06/05/2011   04/28/15 US carotid R+L normal bilateral arterial velocities.    Marland Kitchen Dyspnea 08/11/2014   Followed in Pulmonary clinic/ [facility]/ [facility]  - 08/11/2014  Walked RA x 1 laps @ 185 ft each stopped due to fatigue/off balance/ slow pace/  no sob or desat  - PFT's  09/26/2014  FEV1 2.26 (85 % ) ratio 76  p no % improvement from saba with DLCO  67 % corrects to 93 % for alv volume      Since prev study 08/04/13 minimal change lung vol or dlco    . Embolic cerebral infarction ([place]) 12/06/2015  . Exertional shortness of breath    "sometimes walking" (12/15/2012)  . GERD (gastroesophageal reflux disease)   . Gout 02/09/2015  . Heart murmur    "just told I had one today" (12/15/2012)  . Hiatal hernia   . Hyperlipidemia   . Hypertension   . Hypothyroid   . Insomnia   . Melanoma of back ([place]) 1976  . Myocardial infarction 1996; 2011   "both silent"  (12/15/2012)  . Orthostatic hypotension   . Osteoporosis, senile   . Pacemaker   . RBBB   . Restless leg 02/02/2015  . Right leg weakness 12/06/2015  . [facility] spotted fever   . S/P CABG x 4   . Sick sinus syndrome ([place]) 01/31/2014  . Sustained ventricular tachycardia ([place]) 07/27/2014  . Urinary retention 12/30/2014    Past Surgical History:  Procedure Laterality Date  . CARDIAC CATHETERIZATION  04/2010   LIMA to LAD patent,SVG to OM patent,no in-stnet restenosis RCA  . CATARACT EXTRACTION W/ INTRAOCULAR LENS  IMPLANT, BILATERAL Bilateral 2012  . CORONARY ANGIOPLASTY WITH STENT PLACEMENT  07/2009   bare metal stent to SVG to the RCA  . CORONARY ARTERY BYPASS GRAFT  1996   LIMA to LAD,SVG to RCA & SVG to OM  . INSERT / REPLACE / REMOVE PACEMAKER  2010  . MELANOMA EXCISION  05/1974 X2   "taken off my back" (12/15/2012)  . NM MYOVIEW LTD  06/2011   low risk  . TONSILLECTOMY  1938  . TRANSURETHRAL RESECTION OF PROSTATE  1986  . US ECHOCARDIOGRAPHY  07/11/2009   EF 45-50%     reports that he has never smoked. He has never used smokeless tobacco. He reports that he does not drink alcohol or use drugs.  Allergies  Allergen Reactions  . Altace [Ramipril] Cough  . Bactrim [Sulfamethoxazole-Trimethoprim] Rash  . Crestor [Rosuvastatin Calcium] Rash  . Penicillins Rash and Other (See Comments)    Has patient had a PCN reaction causing immediate rash, facial/tongue/throat swelling, SOB or lightheadedness with hypotension: No Has patient had a PCN reaction causing severe rash involving mucus membranes or skin necrosis: Yes Has patient had a PCN reaction that required hospitalization Yes Has patient had a PCN reaction occurring within the last 10 years: No If all of the above answers are "NO", then may proceed with Cephalosporin use.   . Sulfa Antibiotics Rash    Family History  Problem Relation Age of Onset  . Coronary artery disease Mother   . Diabetes Mother   . Heart  disease Mother   . Coronary artery disease Father   . Diabetes Father   . Lung cancer Father   . Heart disease Father   . Anuerysm Son   . Heart disease Brother     Prior to Admission medications   Medication Sig Start Date End Date Taking? Authorizing Provider  amiodarone (PACERONE) 200 MG tablet Take 400 mg by mouth daily.   Yes Historical Provider, MD  aspirin EC 81 MG tablet Take 1 tablet (81 mg total) by mouth daily. 01/12/16  Yes Reyne Dumas, MD  Cholecalciferol (VITAMIN D3) 5000 UNITS CAPS Take 5,000 Units by mouth every Monday.    Yes Historical Provider, MD  ciprofloxacin (CIPRO) 500 MG tablet Take 1 tablet (500 mg total) by mouth  2 (two) times daily. 03/01/16  Yes Estill Dooms, MD  clobetasol (TEMOVATE) 0.05 % external solution Apply 1 application topically as needed (for itchy scalp).    Yes Historical Provider, MD  clonazePAM (KLONOPIN) 0.5 MG tablet Take 0.5 mg by mouth 3 (three) times daily as needed for anxiety.   Yes Historical Provider, MD  clopidogrel (PLAVIX) 75 MG tablet Take 1 tablet (75 mg total) by mouth daily. 01/12/16  Yes Reyne Dumas, MD  levothyroxine (SYNTHROID, LEVOTHROID) 137 MCG tablet Take 1 tablet (137 mcg total) by mouth daily before breakfast. 01/05/16  Yes Gildardo Cranker, DO  lubiprostone (AMITIZA) 24 MCG capsule Take 24 mcg by mouth at bedtime.    Yes Historical Provider, MD  Magnesium Sulfate (EPSOM SALT) POWD 1 application by Does not apply route at bedtime. Pt is to soak feet once daily.   Yes Historical Provider, MD  midodrine (PROAMATINE) 5 MG tablet Take 5 mg by mouth 3 (three) times daily with meals.   Yes Historical Provider, MD  pantoprazole (PROTONIX) 40 MG tablet Take 40 mg by mouth at bedtime.   Yes Historical Provider, MD  polyethylene glycol (MIRALAX / GLYCOLAX) packet Take 17 g by mouth daily as needed for mild constipation.   Yes Historical Provider, MD  pravastatin (PRAVACHOL) 40 MG tablet Take 40 mg by mouth daily at 12 noon.   Yes  Historical Provider, MD  rOPINIRole (REQUIP) 0.5 MG tablet Take 0.5 mg by mouth 3 (three) times daily as needed (for restless leg syndrome).   Yes Historical Provider, MD  senna-docusate (SENOKOT-S) 8.6-50 MG tablet Take 4 tablets by mouth daily as needed for mild constipation.   Yes Historical Provider, MD  tamsulosin (FLOMAX) 0.4 MG CAPS capsule Take 0.4 mg by mouth at bedtime.    Yes Historical Provider, MD  torsemide (DEMADEX) 20 MG tablet Take 20 mg by mouth daily as needed (for weight gain of 3 or more lbs).    Yes Historical Provider, MD  traZODone (DESYREL) 50 MG tablet Take 50 mg by mouth at bedtime.   Yes Historical Provider, MD  triamcinolone cream (KENALOG) 0.1 % Apply 1 application topically 2 (two) times daily as needed (for irritation).   Yes Historical Provider, MD    Physical Exam: Constitutional: NAD, calm, comfortable Vitals:   03/10/16 1407 03/10/16 1452 03/10/16 1502 03/10/16 1603  BP: 119/69   (!) 107/51  Pulse: 70   70  Resp: 16   17  Temp:      SpO2: 91% 98%  98%  Weight:   79.4 kg (175 lb)   Height:   5\' 10"  (1.778 m)    Eyes: PERRL, lids and conjunctivae normal ENMT: Mucous membranes are moist. Posterior pharynx clear of any exudate or lesions.   Neck: normal, supple, no masses  Respiratory: clear to auscultation bilaterally, no wheezing, no crackles. Normal respiratory effort.   Cardiovascular: Regular rate and rhythm, 3/6 SEM. 1-2+ LE edema. 2+ pedal pulses.  Abdomen: no tenderness, no masses palpated. Bowel sounds positive.  Musculoskeletal: no clubbing / cyanosis. Decreased muscle tone.  Skin: no rashes, lesions, ulcers.  Neurologic: CN 2-12 grossly intact. Strength 5-/5 in RUE, 5/5 in rest Psychiatric: Normal judgment and insight. Alert and oriented x 3. Normal mood.   Labs on Admission: I have personally reviewed following labs and imaging studies  CBC:  Recent Labs Lab 03/10/16 1443  WBC 8.3  NEUTROABS 5.8  HGB 13.1  HCT 39.5  MCV 95.6  PLT  190  Basic Metabolic Panel:  Recent Labs Lab 03/10/16 1443  NA 140  K 3.5  CL 104  CO2 29  GLUCOSE 96  BUN 22*  CREATININE 1.03  CALCIUM 8.7*   GFR: Estimated Creatinine Clearance: 52.2 mL/min (by C-G formula based on SCr of 1.03 mg/dL). Liver Function Tests:  Recent Labs Lab 03/10/16 1443  AST 53*  ALT 26  ALKPHOS 61  BILITOT 0.6  PROT 6.9  ALBUMIN 3.7   No results for input(s): LIPASE, AMYLASE in the last 168 hours. No results for input(s): AMMONIA in the last 168 hours. Coagulation Profile: No results for input(s): INR, PROTIME in the last 168 hours. Cardiac Enzymes: No results for input(s): CKTOTAL, CKMB, CKMBINDEX, TROPONINI in the last 168 hours. BNP (last 3 results) No results for input(s): PROBNP in the last 8760 hours. HbA1C: No results for input(s): HGBA1C in the last 72 hours. CBG: No results for input(s): GLUCAP in the last 168 hours. Lipid Profile: No results for input(s): CHOL, HDL, LDLCALC, TRIG, CHOLHDL, LDLDIRECT in the last 72 hours. Thyroid Function Tests: No results for input(s): TSH, T4TOTAL, FREET4, T3FREE, THYROIDAB in the last 72 hours. Anemia Panel: No results for input(s): VITAMINB12, FOLATE, FERRITIN, TIBC, IRON, RETICCTPCT in the last 72 hours. Urine analysis:    Component Value Date/Time   COLORURINE YELLOW 03/10/2016 1411   APPEARANCEUR CLEAR 03/10/2016 1411   LABSPEC 1.017 03/10/2016 1411   PHURINE 6.0 03/10/2016 1411   GLUCOSEU NEGATIVE 03/10/2016 1411   HGBUR NEGATIVE 03/10/2016 1411   BILIRUBINUR NEGATIVE 03/10/2016 1411   KETONESUR NEGATIVE 03/10/2016 1411   PROTEINUR NEGATIVE 03/10/2016 1411   UROBILINOGEN 1.0 12/06/2014 1152   NITRITE NEGATIVE 03/10/2016 1411   LEUKOCYTESUR NEGATIVE 03/10/2016 1411   Sepsis Labs: @LABRCNTIP (procalcitonin:4,lacticidven:4) )No results found for this or any previous visit (from the past 240 hour(s)).   Radiological Exams on Admission: Dg Chest 2 View  Result Date:  03/10/2016 CLINICAL DATA:  Cough.  Influenza EXAM: CHEST  2 VIEW COMPARISON:  01/11/2016 FINDINGS: Large hiatal hernia. Increased streaky opacity at the left base around the hernia. Chronic cardiopericardial enlargement. Dual-chamber pacer leads from the left. Status post CABG. No edema, effusion, or pneumothorax. IMPRESSION: 1. Increased atelectasis around the patient's large hiatal hernia. No definitive pneumonia. 2. Cardiomegaly without failure. Electronically Signed   By: Monte Fantasia M.D.   On: 03/10/2016 14:35    EKG: Independently reviewed.  Assessment/Plan Active Problems:   Cardiomyopathy, ischemic   Pacemaker   Hyperlipidemia   Coronary artery disease involving native coronary artery of native heart without angina pectoris   Orthostatic hypotension   RLS (restless legs syndrome)   Combined congestive systolic and diastolic heart failure (HCC)   Insomnia   Hypotension   Hx of CABG   History of arterial ischemic stroke   Influenza    Influenza illness / URI - Start empirically on Tamiflu, chest x-ray without evidence of pneumonia, patient without any productive cough, we'll hold further antibiotics other than antivirals - Admitted to the hospital for supportive treatment, he is not hypotensive nor febrile, not tachycardic, given chronic combined systolic CHF with lower extremity edema and ability to take by mouth intake will not do fluids at this time - We'll order physical therapy in the morning to reevaluate, patient's family is concerned about his ability to go back to independent living and are inquiring into short-term SNF. If PT recommends that, will need social worker consult in the morning.  Coronary artery disease / ischemic cardiomyopathy / chronic  combined systolic and diastolic CHF / history of CABG - Continue home regimen with amiodarone, aspirin, Plavix, statin  History of CVA - With right arm weakness, chronic, unchanged, continue aspirin and  Plavix  Hypothyroidism - Continue home Synthroid  Sick sinus syndrome status post pacemaker - We'll monitor on telemetry  Hyperlipidemia - Continue pravastatin  History of orthostatic hypotension - He is on Midodrine, continue  Anxiety - Home clonazepam when necessary  BPH - Continue Flomax  Restless legs - Continue home medications    DVT prophylaxis: Lovenox  Code Status: Full code  Family Communication: d/w daughter bedside Disposition Plan: admit to telemetry Consults called: none   Admission status: observation.    Marzetta Board, MD [facility] Pager [phone]  If 7PM-7AM, please contact night-coverage [email or web address] Password [email or web address]  03/10/2016, 5:21 PM

## 2016-03-10 NOTE — ED Notes (Signed)
Blood sent to lab.  Drawn at 1353.

## 2016-03-10 NOTE — ED Notes (Signed)
Pt attempting urine sample with urinal provided at this time.

## 2016-03-10 NOTE — ED Notes (Signed)
Pt has a urinal at bedside and is aware that a urine sample is needed 

## 2016-03-10 NOTE — ED Notes (Signed)
Bed: WA01 Expected date:  Expected time:  Means of arrival:  Comments: TRI4

## 2016-03-11 DIAGNOSIS — I5042 Chronic combined systolic (congestive) and diastolic (congestive) heart failure: Secondary | ICD-10-CM | POA: Diagnosis not present

## 2016-03-11 DIAGNOSIS — I255 Ischemic cardiomyopathy: Secondary | ICD-10-CM

## 2016-03-11 DIAGNOSIS — N179 Acute kidney failure, unspecified: Secondary | ICD-10-CM

## 2016-03-11 DIAGNOSIS — I951 Orthostatic hypotension: Secondary | ICD-10-CM

## 2016-03-11 DIAGNOSIS — J111 Influenza due to unidentified influenza virus with other respiratory manifestations: Secondary | ICD-10-CM | POA: Diagnosis not present

## 2016-03-11 LAB — COMPREHENSIVE METABOLIC PANEL
ALT: 23 U/L (ref 17–63)
AST: 46 U/L — ABNORMAL HIGH (ref 15–41)
Albumin: 3.1 g/dL — ABNORMAL LOW (ref 3.5–5.0)
Alkaline Phosphatase: 51 U/L (ref 38–126)
Anion gap: 6 (ref 5–15)
BUN: 18 mg/dL (ref 6–20)
CO2: 25 mmol/L (ref 22–32)
Calcium: 8 mg/dL — ABNORMAL LOW (ref 8.9–10.3)
Chloride: 108 mmol/L (ref 101–111)
Creatinine, Ser: 0.72 mg/dL (ref 0.61–1.24)
GFR calc Af Amer: >60 mL/min (ref >60)
GFR calc non Af Amer: >60 mL/min (ref >60)
Glucose, Bld: 105 mg/dL — ABNORMAL HIGH (ref 65–99)
Potassium: 3.7 mmol/L (ref 3.5–5.1)
Sodium: 139 mmol/L (ref 135–145)
Total Bilirubin: 0.4 mg/dL (ref 0.3–1.2)
Total Protein: 5.8 g/dL — ABNORMAL LOW (ref 6.5–8.1)

## 2016-03-11 LAB — CBC
HCT: 36.9 % — ABNORMAL LOW (ref 39.0–52.0)
Hemoglobin: 12 g/dL — ABNORMAL LOW (ref 13.0–17.0)
MCH: 31.2 pg (ref 26.0–34.0)
MCHC: 32.5 g/dL (ref 30.0–36.0)
MCV: 95.8 fL (ref 78.0–100.0)
Platelets: 176 10*3/uL (ref 150–400)
RBC: 3.85 MIL/uL — ABNORMAL LOW (ref 4.22–5.81)
RDW: 15.3 % (ref 11.5–15.5)
WBC: 6.3 10*3/uL (ref 4.0–10.5)

## 2016-03-11 LAB — INFLUENZA PANEL BY PCR (TYPE A & B)
Influenza A By PCR: POSITIVE — AB
Influenza B By PCR: NEGATIVE

## 2016-03-11 MED ORDER — SODIUM CHLORIDE 0.9 % IV BOLUS (SEPSIS)
500.0000 mL | Freq: Once | INTRAVENOUS | Status: AC
  Administered 2016-03-11: 500 mL via INTRAVENOUS

## 2016-03-11 MED ORDER — CIPROFLOXACIN HCL 500 MG PO TABS
500.0000 mg | ORAL_TABLET | Freq: Two times a day (BID) | ORAL | Status: DC
  Administered 2016-03-11: 500 mg via ORAL
  Filled 2016-03-11: qty 1

## 2016-03-11 MED ORDER — OSELTAMIVIR PHOSPHATE 75 MG PO CAPS: 75.0000 mg | ORAL_CAPSULE | Freq: Two times a day (BID) | ORAL | 0 refills | Status: DC

## 2016-03-11 NOTE — Progress Notes (Signed)
Patient is set to discharge to [facility] SNF today. Patient & wife at bedside & daughter, Caryl Asp made aware. Discharge packet given to RN, Ify. [facility] called for transport to pickup at 3:00pm.     Raynaldo Opitz, [place] Worker cell #: [phone]

## 2016-03-11 NOTE — Progress Notes (Signed)
Unable to In/Out cath meeting resistance patient with prostate problem, MD informed and ordered place coude cath and leave inplace.

## 2016-03-11 NOTE — NC FL2 (Signed)
[place] LEVEL OF CARE SCREENING TOOL     IDENTIFICATION  Patient Name: Adam [name] Birthdate: August 14, 1926 Sex: male Admission Date (Current Location): 03/10/2016  [facility] and [place] Number:  Herbalist and Address:  [facility],  [address], [place]      Provider Number: [id number]  Attending Physician Name and Address:  Charlynne Cousins, MD  Relative Name and Phone Number:       Current Level of Care: Hospital Recommended Level of Care: [place] Prior Approval Number:    Date Approved/Denied:   PASRR Number:   [id number]  Discharge Plan: SNF    Current Diagnoses: Patient Active Problem List   Diagnosis Date Noted  . Influenza 03/10/2016  . History of arterial ischemic stroke 03/05/2016  . Cellulitis 02/27/2016  . Rash and nonspecific skin eruption 02/27/2016  . Chronic systolic heart failure ([place])   . Nonrheumatic aortic valve stenosis   . TIA (transient ischemic attack) 01/10/2016  . Stroke-like symptoms 01/10/2016  . Atypical chest pain 01/10/2016  . Hypotension 01/10/2016  . Hx of CABG 01/10/2016  . Chronic anticoagulation 12/22/2015  . Embolic cerebral infarction ([place]) 12/06/2015  . Urinary tract infection 04/06/2015  . Insomnia 03/23/2015  . Gout 02/09/2015  . Depression with anxiety 02/02/2015  . RLS (restless legs syndrome) 02/02/2015  . Combined congestive systolic and diastolic heart failure ([place]) 02/02/2015  . Urinary frequency 02/02/2015  . Pressure ulcer 01/01/2015  . Urinary retention 12/30/2014  . Coronary artery disease involving native coronary artery of native heart without angina pectoris 12/30/2014  . Expressive aphasia 12/30/2014  . Constipation   . Orthostatic hypotension   . Chronic knee pain 12/03/2014  . GERD (gastroesophageal reflux disease)   . Hypertension   . Dyspnea 08/11/2014  . Hypothyroidism 08/11/2014  . VT (ventricular tachycardia) ([place])  07/27/2014  . SSS (sick sinus syndrome) ([place]) 01/31/2014  . Hyperlipidemia 01/30/2014  . Cardiomyopathy, ischemic 08/25/2012  . Pacemaker 08/25/2012  . Dizzy 06/05/2011  . Bradycardia     Orientation RESPIRATION BLADDER Height & Weight     Self, Time, Situation, Place  Normal Continent Weight: 175 lb 0.7 oz (79.4 kg) Height:  5\' 11"  (180.3 cm)  BEHAVIORAL SYMPTOMS/MOOD NEUROLOGICAL BOWEL NUTRITION STATUS      Incontinent Diet (Heart)  AMBULATORY STATUS COMMUNICATION OF NEEDS Skin   Extensive Assist Verbally PU Stage and Appropriate Care (Pressure Ulcer 12/31/14 Stage II -  Partial thickness loss of dermis presenting as a shallow open ulcer with a red, pink wound bed without slough. (sacrum))                       Personal Care Assistance Level of Assistance  Bathing, Dressing Bathing Assistance: Limited assistance   Dressing Assistance: Limited assistance     Functional Limitations Info             SPECIAL CARE FACTORS FREQUENCY  PT (By licensed PT), OT (By licensed OT)     PT Frequency: 5 OT Frequency: 5            Contractures      Additional Factors Info  Code Status, Allergies Code Status Info: Fullcode Allergies Info: Altace Ramipril, Bactrim Sulfamethoxazole-trimethoprim, Crestor Rosuvastatin Calcium, Penicillins, Sulfa Antibiotics           Current Medications (03/11/2016):  This is the current hospital active medication list Current Facility-Administered Medications  Medication Dose Route Frequency Provider Last Rate  Last Dose  . acetaminophen (TYLENOL) tablet 650 mg  650 mg Oral Q6H PRN Costin Karlyne Greenspan, MD       Or  . acetaminophen (TYLENOL) suppository 650 mg  650 mg Rectal Q6H PRN Costin Karlyne Greenspan, MD      . amiodarone (PACERONE) tablet 400 mg  400 mg Oral Daily [name], MD   400 mg at 03/11/16 0909  . aspirin EC tablet 81 mg  81 mg Oral Daily [name], MD   81 mg at 03/11/16 0909  . ciprofloxacin (CIPRO) tablet 500 mg   500 mg Oral BID Charlynne Cousins, MD      . clonazePAM [facility]) tablet 0.5 mg  0.5 mg Oral TID PRN [name], MD      . clopidogrel (PLAVIX) tablet 75 mg  75 mg Oral Daily [name], MD   75 mg at 03/11/16 0908  . enoxaparin (LOVENOX) injection 40 mg  40 mg Subcutaneous Q24H [name], MD   40 mg at 03/10/16 2202  . levothyroxine (SYNTHROID, LEVOTHROID) tablet 137 mcg  137 mcg Oral QAC breakfast [name], MD   137 mcg at 03/11/16 0908  . lubiprostone (AMITIZA) capsule 24 mcg  24 mcg Oral QHS [name], MD   24 mcg at 03/10/16 2201  . midodrine (PROAMATINE) tablet 5 mg  5 mg Oral TID WC Costin Karlyne Greenspan, MD   5 mg at 03/11/16 0908  . ondansetron (ZOFRAN) tablet 4 mg  4 mg Oral Q6H PRN Costin Karlyne Greenspan, MD       Or  . ondansetron (ZOFRAN) injection 4 mg  4 mg Intravenous Q6H PRN Costin Karlyne Greenspan, MD      . oseltamivir (TAMIFLU) capsule 75 mg  75 mg Oral BID [name], MD   75 mg at 03/11/16 0909  . pantoprazole (PROTONIX) EC tablet 40 mg  40 mg Oral QHS [name], MD   40 mg at 03/10/16 2201  . polyethylene glycol (MIRALAX / GLYCOLAX) packet 17 g  17 g Oral Daily PRN Costin Karlyne Greenspan, MD      . pravastatin (PRAVACHOL) tablet 40 mg  40 mg Oral QPM Costin Karlyne Greenspan, MD   40 mg at 03/10/16 1845  . rOPINIRole (REQUIP) tablet 0.5 mg  0.5 mg Oral TID PRN [name], MD   0.5 mg at 03/10/16 2316  . senna-docusate (Senokot-S) tablet 4 tablet  4 tablet Oral Daily PRN Costin Karlyne Greenspan, MD      . tamsulosin (FLOMAX) capsule 0.4 mg  0.4 mg Oral QHS [name], MD   0.4 mg at 03/10/16 2201  . traZODone (DESYREL) tablet 50 mg  50 mg Oral QHS [name], MD   50 mg at 03/10/16 2201     Discharge Medications: Please see discharge summary for a list of discharge medications.  Relevant Imaging Results:  Relevant Lab Results:   Additional Information SSN: [id number]  Standley Brooking, LCSW

## 2016-03-11 NOTE — Progress Notes (Signed)
Patient discharged to SNF[facility] via ambulance, discharge packet prepared by CSW and given to EMS for facility. [facility]. Patient had a ulcer on the 2nd left toe that he was admitted  With, no sign of infection noted and managed by his PCP he will F/U outpatient with PCP and reported to SNF staff as well to F/U with plan of care. Patient denies any pain/distress.

## 2016-03-11 NOTE — Discharge Summary (Addendum)
Physician Discharge Summary  OLIS SWANNER [id number] DOB: 1926/08/13 DOA: 03/10/2016  PCP: Jeanmarie Hubert, MD  Admit date: 03/10/2016 Discharge date: 03/11/2016  Admitted From: Independently living facility  Disposition:  Skilled nursing facility.   Recommendations for Outpatient Follow-up:  1. Follow up with Urology in 1 weeks, for foley removal 2. Please obtain BMP/CBC in one week   Home Health:no Equipment/Devices:None  Discharge Condition:stable CODE STATUS; Full Diet recommendation: Heart Healthy  Brief/Interim Summary: 83 y.o. male past medical history significant for coronary artery C status post CABG chronic combined systolic and diastolic heart failure, sick sinus syndrome status post pacemaker with history of CVA that was brought to the emergency room like symptoms last 4 days with upper respiratory symptoms and sinus congestion, admission he had a fever at home of 104 he was admitted to the hospital started on Tamiflu influenza PCR is pending.  Discharge Diagnoses:  Active Problems:   Cardiomyopathy, ischemic   Pacemaker   Hyperlipidemia   Coronary artery disease involving native coronary artery of native heart without angina pectoris   Orthostatic hypotension   RLS (restless legs syndrome)   Combined congestive systolic and diastolic heart failure (HCC)   Insomnia   Hypotension   Hx of CABG   History of arterial ischemic stroke   Influenza   AKI (acute kidney injury) ([place])  Influenza-like illness/upper respiratory infection: Chest x-ray showed no evidence of infiltrates, I agree with holding antibiotics. Continue Tamiflu. Physical therapy evaluation is pending.  Acute kidney injury: Resolved with IV fluid hydration likely prerenal in etiology.  Coronary artery disease/ischemic cardiomyopathy/chronic combined systolic and diastolic heart failure: Currently asymptomatic continue home regimen amiodarone, aspirin, Plavix and statins.  History of  CVA: Residual right arm weakness continue aspirin and Plavix.  Hypothyroidism:  Continue Synthroid.  Sick sinus syndrome status post pacemaker: No evidence on telemetry.  Hyperlipidemia; Cont statins.  RLS (restless legs syndrome) Continue home medications.  Urinary retention: Cont flomax, foley placed to follow-up with urology as an outpatient.  Discharge Instructions  Discharge Instructions    Diet - low sodium heart healthy    Complete by:  As directed    Increase activity slowly    Complete by:  As directed      Allergies as of 03/11/2016      Reactions   Altace [ramipril] Cough   Bactrim [sulfamethoxazole-trimethoprim] Rash   Crestor [rosuvastatin Calcium] Rash   Penicillins Rash, Other (See Comments)   Has patient had a PCN reaction causing immediate rash, facial/tongue/throat swelling, SOB or lightheadedness with hypotension: No Has patient had a PCN reaction causing severe rash involving mucus membranes or skin necrosis: Yes Has patient had a PCN reaction that required hospitalization Yes Has patient had a PCN reaction occurring within the last 10 years: No If all of the above answers are "NO", then may proceed with Cephalosporin use.   Sulfa Antibiotics Rash      Medication List    TAKE these medications   amiodarone 200 MG tablet Commonly known as:  PACERONE Take 400 mg by mouth daily.   aspirin EC 81 MG tablet Take 1 tablet (81 mg total) by mouth daily.   ciprofloxacin 500 MG tablet Commonly known as:  CIPRO Take 1 tablet (500 mg total) by mouth 2 (two) times daily.   clobetasol 0.05 % external solution Commonly known as:  TEMOVATE Apply 1 application topically as needed (for itchy scalp).   clonazePAM 0.5 MG tablet Commonly known as:  KLONOPIN Take 0.5 mg by  mouth 3 (three) times daily as needed for anxiety.   clopidogrel 75 MG tablet Commonly known as:  PLAVIX Take 1 tablet (75 mg total) by mouth daily.   EPSOM SALT Powd Generic drug:   Magnesium Sulfate 1 application by Does not apply route at bedtime. Pt is to soak feet once daily.   levothyroxine 137 MCG tablet Commonly known as:  SYNTHROID, LEVOTHROID Take 1 tablet (137 mcg total) by mouth daily before breakfast.   lubiprostone 24 MCG capsule Commonly known as:  AMITIZA Take 24 mcg by mouth at bedtime.   midodrine 5 MG tablet Commonly known as:  PROAMATINE Take 5 mg by mouth 3 (three) times daily with meals.   oseltamivir 75 MG capsule Commonly known as:  TAMIFLU Take 1 capsule (75 mg total) by mouth 2 (two) times daily.   pantoprazole 40 MG tablet Commonly known as:  PROTONIX Take 40 mg by mouth at bedtime.   polyethylene glycol packet Commonly known as:  MIRALAX / GLYCOLAX Take 17 g by mouth daily as needed for mild constipation.   pravastatin 40 MG tablet Commonly known as:  PRAVACHOL Take 40 mg by mouth daily at 12 noon.   rOPINIRole 0.5 MG tablet Commonly known as:  REQUIP Take 0.5 mg by mouth 3 (three) times daily as needed (for restless leg syndrome).   senna-docusate 8.6-50 MG tablet Commonly known as:  Senokot-S Take 4 tablets by mouth daily as needed for mild constipation.   tamsulosin 0.4 MG Caps capsule Commonly known as:  FLOMAX Take 0.4 mg by mouth at bedtime.   torsemide 20 MG tablet Commonly known as:  DEMADEX Take 20 mg by mouth daily as needed (for weight gain of 3 or more lbs).   traZODone 50 MG tablet Commonly known as:  DESYREL Take 50 mg by mouth at bedtime.   triamcinolone cream 0.1 % Commonly known as:  KENALOG Apply 1 application topically 2 (two) times daily as needed (for irritation).   Vitamin D3 5000 units Caps Take 5,000 Units by mouth every Monday.      Follow-up Information    DAHLSTEDT, Lillette Boxer, MD Follow up in 1 week(s).   Specialty:  Urology Why:  for foley removal after voiding trial Contact information: [address] [place] [address] [phone]          Allergies  Allergen  Reactions  . Altace [Ramipril] Cough  . Bactrim [Sulfamethoxazole-Trimethoprim] Rash  . Crestor [Rosuvastatin Calcium] Rash  . Penicillins Rash and Other (See Comments)    Has patient had a PCN reaction causing immediate rash, facial/tongue/throat swelling, SOB or lightheadedness with hypotension: No Has patient had a PCN reaction causing severe rash involving mucus membranes or skin necrosis: Yes Has patient had a PCN reaction that required hospitalization Yes Has patient had a PCN reaction occurring within the last 10 years: No If all of the above answers are "NO", then may proceed with Cephalosporin use.   . Sulfa Antibiotics Rash    Consultations:  None   Procedures/Studies: Dg Chest 2 View  Result Date: 03/10/2016 CLINICAL DATA:  Cough.  Influenza EXAM: CHEST  2 VIEW COMPARISON:  01/11/2016 FINDINGS: Large hiatal hernia. Increased streaky opacity at the left base around the hernia. Chronic cardiopericardial enlargement. Dual-chamber pacer leads from the left. Status post CABG. No edema, effusion, or pneumothorax. IMPRESSION: 1. Increased atelectasis around the patient's large hiatal hernia. No definitive pneumonia. 2. Cardiomegaly without failure. Electronically Signed   By: Neva Seat.D.  On: 03/10/2016 14:35    Subjective: Feels great and better, compared to yesterday.  Discharge Exam: Vitals:   03/11/16 0945 03/11/16 0946  BP: (!) 101/44 (!) 113/45  Pulse: 71 69  Resp:    Temp:     Vitals:   03/10/16 2108 03/11/16 0613 03/11/16 0945 03/11/16 0946  BP: (!) 100/45 136/65 (!) 101/44 (!) 113/45  Pulse: 70 69 71 69  Resp: 18 16    Temp: 98.9 F (37.2 C) 99.5 F (37.5 C)    TempSrc: Oral Oral    SpO2: 98% 93%    Weight:      Height:        General: Pt is alert, awake, not in acute distress Cardiovascular: RRR, S1/S2 +, no rubs, no gallops Respiratory: CTA bilaterally, no wheezing, no rhonchi Abdominal: Soft, NT, ND, bowel sounds + Extremities: no edema,  no cyanosis    The results of significant diagnostics from this hospitalization (including imaging, microbiology, ancillary and laboratory) are listed below for reference.     Microbiology: No results found for this or any previous visit (from the past 240 hour(s)).   Labs: BNP (last 3 results) No results for input(s): BNP in the last 8760 hours. Basic Metabolic Panel:  Recent Labs Lab 03/10/16 1443 03/11/16 0436  NA 140 139  K 3.5 3.7  CL 104 108  CO2 29 25  GLUCOSE 96 105*  BUN 22* 18  CREATININE 1.03 0.72  CALCIUM 8.7* 8.0*   Liver Function Tests:  Recent Labs Lab 03/10/16 1443 03/11/16 0436  AST 53* 46*  ALT 26 23  ALKPHOS 61 51  BILITOT 0.6 0.4  PROT 6.9 5.8*  ALBUMIN 3.7 3.1*   No results for input(s): LIPASE, AMYLASE in the last 168 hours. No results for input(s): AMMONIA in the last 168 hours. CBC:  Recent Labs Lab 03/10/16 1443 03/11/16 0436  WBC 8.3 6.3  NEUTROABS 5.8  --   HGB 13.1 12.0*  HCT 39.5 36.9*  MCV 95.6 95.8  PLT 190 176   Cardiac Enzymes: No results for input(s): CKTOTAL, CKMB, CKMBINDEX, TROPONINI in the last 168 hours. BNP: Invalid input(s): POCBNP CBG: No results for input(s): GLUCAP in the last 168 hours. D-Dimer No results for input(s): DDIMER in the last 72 hours. Hgb A1c No results for input(s): HGBA1C in the last 72 hours. Lipid Profile No results for input(s): CHOL, HDL, LDLCALC, TRIG, CHOLHDL, LDLDIRECT in the last 72 hours. Thyroid function studies No results for input(s): TSH, T4TOTAL, T3FREE, THYROIDAB in the last 72 hours.  Invalid input(s): FREET3 Anemia work up No results for input(s): VITAMINB12, FOLATE, FERRITIN, TIBC, IRON, RETICCTPCT in the last 72 hours. Urinalysis    Component Value Date/Time   COLORURINE YELLOW 03/10/2016 1411   APPEARANCEUR CLEAR 03/10/2016 1411   LABSPEC 1.017 03/10/2016 1411   PHURINE 6.0 03/10/2016 1411   GLUCOSEU NEGATIVE 03/10/2016 1411   HGBUR NEGATIVE 03/10/2016 1411    BILIRUBINUR NEGATIVE 03/10/2016 1411   KETONESUR NEGATIVE 03/10/2016 1411   PROTEINUR NEGATIVE 03/10/2016 1411   UROBILINOGEN 1.0 12/06/2014 1152   NITRITE NEGATIVE 03/10/2016 1411   LEUKOCYTESUR NEGATIVE 03/10/2016 1411   Sepsis Labs Invalid input(s): PROCALCITONIN,  WBC,  LACTICIDVEN Microbiology No results found for this or any previous visit (from the past 240 hour(s)).   Time coordinating discharge: Over 30 minutes  SIGNED:   Charlynne Cousins, MD  [facility] 03/11/2016, 11:02 AM Pager   If 7PM-7AM, please contact night-coverage [email or web address] Password [email or web address]

## 2016-03-11 NOTE — Progress Notes (Signed)
Patient unable to void, last void 3am per patient and is having urge to urinate, bladder scan >900, Dr. Aileen Fass notified ordered In/Out cath.

## 2016-03-11 NOTE — Care Management Note (Signed)
Case Management Note  Patient Details  Name: Adam [name] MRN: [id number] Date of Birth: Nov 15, 1926  Subjective/Objective:  83 y/o m admitted w/Influenza. From indep liv-[facility]. PT-recc SNF.CSW already following for SNF.                  Action/Plan:d/c SNF   Expected Discharge Date:  03/11/16               Expected Discharge Plan:  Skilled Nursing Facility  In-House Referral:  Clinical Social Work  Discharge planning Services  CM Consult  Post Acute Care Choice:    Choice offered to:     DME Arranged:    DME Agency:     HH Arranged:    [place] Agency:     Status of Service:  Completed, signed off  If discussed at [employer], dates discussed:    Additional Comments:  Dessa Phi, RN 03/11/2016, 10:59 AM

## 2016-03-11 NOTE — Evaluation (Signed)
Physical Therapy Evaluation Patient Details Name: Adam [name] MRN: [id number] DOB: 05-14-1926 Today's Date: 03/11/2016   History of Present Illness  83 y.o. male with medical history significant of coronary artery disease status post CABG, chronic combined systolic and diastolic CHF, orthostatic hypotension, bradycardia/SSS status post pacemaker, prior history of CVA, presents with flulike symptoms  Clinical Impression  Pt admitted with above diagnosis. Pt currently with functional limitations due to the deficits listed below (see PT Problem List).  Pt will benefit from skilled PT to increase their independence and safety with mobility to allow discharge to the venue listed below.  Pt with limited evaluation due to complaints of dizziness.  Pt requiring assist for bed mobility and would require assist for ambulation upon d/c for at least safety, so recommending d/c to SNF at this time.     Follow Up Recommendations SNF;Supervision/Assistance - 24 hour    Equipment Recommendations  Rolling walker with 5" wheels    Recommendations for Other Services       Precautions / Restrictions Precautions Precautions: Fall Restrictions Weight Bearing Restrictions: No      Mobility  Bed Mobility Overal bed mobility: Needs Assistance Bed Mobility: Supine to Sit;Sit to Supine     Supine to sit: Mod assist Sit to supine: Mod assist   General bed mobility comments: assist for trunk upright and LEs onto bed, pt very dizzy sitting EOB, BP documentated in flowsheet, pt felt unable to stand at this time, Spo2 dropped briefly to 84% room air however pt with continuous pulse ox and using hands on bed rail to self assist, SpO2 quickly improved to 93% and remained in low 90s  Transfers                    Ambulation/Gait                Stairs            Wheelchair Mobility    Modified Rankin (Stroke Patients Only)       Balance Overall balance assessment: History of  Falls                                           Pertinent Vitals/Pain Pain Assessment: No/denies pain    Home Living   Living Arrangements: Spouse/significant other Available Help at Discharge: Family;Available 24 hours/day Type of Home: Independent living facility         Home Equipment: Walker - 4 wheels;Grab bars - toilet;Grab bars - tub/shower Additional Comments: Lives at [facility] in [place] with his wife.    Prior Function Level of Independence: Independent with assistive device(s)         Comments: used rollator, typically goes to dining hall for meals     Hand Dominance        Extremity/Trunk Assessment   Upper Extremity Assessment Upper Extremity Assessment: Generalized weakness    Lower Extremity Assessment Lower Extremity Assessment: Generalized weakness       Communication   Communication: No difficulties  Cognition Arousal/Alertness: Awake/alert Behavior During Therapy: WFL for tasks assessed/performed Overall Cognitive Status: Within Functional Limits for tasks assessed                      General Comments      Exercises     Assessment/Plan    PT Assessment  Patient needs continued PT services  PT Problem List Decreased strength;Decreased activity tolerance;Decreased mobility;Cardiopulmonary status limiting activity          PT Treatment Interventions DME instruction;Gait training;Therapeutic exercise;Therapeutic activities;Functional mobility training;Balance training;Patient/family education    PT Goals (Current goals can be found in the Care Plan section)  Acute Rehab PT Goals PT Goal Formulation: With patient Time For Goal Achievement: 03/18/16 Potential to Achieve Goals: Good    Frequency Min 3X/week   Barriers to discharge        Co-evaluation               End of Session Equipment Utilized During Treatment: Gait belt Activity Tolerance: Other (comment) (limited by  dizziness) Patient left: in bed;with call bell/phone within reach Nurse Communication: Mobility status    Functional Assessment Tool Used: clinical judgement Functional Limitation: Mobility: Walking and moving around Mobility: Walking and Moving Around Current Status [id number]): At least 40 percent but less than 60 percent impaired, limited or restricted Mobility: Walking and Moving Around Goal Status [phone]): At least 1 percent but less than 20 percent impaired, limited or restricted    Time: [id number] PT Time Calculation (min) (ACUTE ONLY): 22 min   Charges:   PT Evaluation $PT Eval Low Complexity: 1 Procedure     PT G Codes:   PT G-Codes **NOT FOR INPATIENT CLASS** Functional Assessment Tool Used: clinical judgement Functional Limitation: Mobility: Walking and moving around Mobility: Walking and Moving Around Current Status [id number]): At least 40 percent but less than 60 percent impaired, limited or restricted Mobility: Walking and Moving Around Goal Status [phone]): At least 1 percent but less than 20 percent impaired, limited or restricted    Arianny Pun,KATHrine E 03/11/2016, 12:16 PM Carmelia Bake, PT, DPT 03/11/2016 Pager: [phone]

## 2016-03-12 ENCOUNTER — Ambulatory Visit: Payer: Self-pay | Admitting: Neurology

## 2016-03-12 ENCOUNTER — Telehealth: Payer: Self-pay | Admitting: *Deleted

## 2016-03-12 LAB — URINE CULTURE: Culture: NO GROWTH

## 2016-03-12 NOTE — Telephone Encounter (Signed)
Mirapex is on the drugs listed from his hospital stay.

## 2016-03-12 NOTE — Telephone Encounter (Signed)
Patient wife called and stated that her husband is in the [facility] with the Flu and he is having a bad case of RLS and no one will give him anything for it. Please Advise.

## 2016-03-13 ENCOUNTER — Telehealth: Payer: Self-pay

## 2016-03-13 NOTE — Telephone Encounter (Signed)
Possible re-admission to facility. This is a patient you were seeing at [facility] F/U is needed if patient was re-admitted to facility upon discharge. Hospital discharge from [place] [facility] on 03/11/16.

## 2016-03-14 ENCOUNTER — Encounter: Payer: Self-pay | Admitting: Internal Medicine

## 2016-03-14 ENCOUNTER — Non-Acute Institutional Stay (SKILLED_NURSING_FACILITY): Payer: Medicare Other | Admitting: Internal Medicine

## 2016-03-14 DIAGNOSIS — J101 Influenza due to other identified influenza virus with other respiratory manifestations: Secondary | ICD-10-CM

## 2016-03-14 DIAGNOSIS — R339 Retention of urine, unspecified: Secondary | ICD-10-CM

## 2016-03-14 DIAGNOSIS — Z7901 Long term (current) use of anticoagulants: Secondary | ICD-10-CM

## 2016-03-14 DIAGNOSIS — L03032 Cellulitis of left toe: Secondary | ICD-10-CM

## 2016-03-14 DIAGNOSIS — I5042 Chronic combined systolic (congestive) and diastolic (congestive) heart failure: Secondary | ICD-10-CM | POA: Diagnosis not present

## 2016-03-14 DIAGNOSIS — R531 Weakness: Secondary | ICD-10-CM | POA: Diagnosis not present

## 2016-03-14 DIAGNOSIS — I255 Ischemic cardiomyopathy: Secondary | ICD-10-CM | POA: Diagnosis not present

## 2016-03-14 DIAGNOSIS — G2581 Restless legs syndrome: Secondary | ICD-10-CM

## 2016-03-14 DIAGNOSIS — I10 Essential (primary) hypertension: Secondary | ICD-10-CM | POA: Diagnosis not present

## 2016-03-14 DIAGNOSIS — G4700 Insomnia, unspecified: Secondary | ICD-10-CM

## 2016-03-14 NOTE — Progress Notes (Signed)
HISTORY AND PHYSICAL  Location:    [place] Room Number: 74 Place of Service: SNF (31)   Extended Emergency Contact Information Primary Emergency Contact: Kasel,[name] Address: [address], [place] Phone: [phone] Mobile Phone: [phone] Relation: Spouse Secondary Emergency Contact: [name],Monte  [place] Phone: [phone] Mobile Phone: [phone] Relation: Daughter  Advanced Directive information Does Patient Have a Medical Advance Directive?: Yes, Type of Advance Directive: Healthcare Power of [place];Living will  Chief Complaint  Patient presents with  . New Admit To SNF    HPI:  Last seen by me in [facility] clinic on 03/05/16. Soon after, he became ill and dyspneic with T100.4. Was taken to Er for evaluation. They feelt he ww too weak to return home safely and he was admitted overnight. Influenza A serology was positive. He was started on Tamiflu. Cipro used for cellulitis of the right 2nd toe was continued.  Using Foley catheter for urinary retention. This was inserted in the ER. To be followed by Dr. Diona Fanti.  Multiple other active medical problems: Ischemic cardiomyopathy, CAD. Combined systolic and diastolic CHF, hx ischemic CVA and residual right arm weakness, pacemaker, weak, edema, cellulitis of the left 2nd toe treated with Cipro.They seem to be stable or improving.  Past Medical History:  Diagnosis Date  . Arthritis    "minor, back and sometimes knees" (12/15/2012)  . Bradycardia    AFib/SSS s/p St Jude PPM 04/12/2008  . CAD (coronary artery disease) 12/30/2014   CABG (LIMA-LAD, SVG-RCA, SVG-OM in 1996).  07/2009 BMS to SVG-RCA. Cath in 04/2010 with patent stents   . Cardiomyopathy, ischemic 08/25/2012  . CHF (congestive heart failure) ([place])   . Chronic knee pain 12/03/2014  . Combined congestive systolic and diastolic heart failure ([place]) 02/02/2015   Hx EF 41%. BNP 96.8  02/21/15 Torsemide 04/06/15 Na 142, K 4.6, Bun 16, creat 0.89 04/20/14 BNP 111.7, Na 142, K 4.6, Bun 16, creat 0.9   . Depression with anxiety 02/02/2015   02/21/15 Hgb A1c 5.8 03/10/15 MMSE 30/30   . Dizziness, after diuretic asscoiated with hypotension and responded to fluid bolus 06/05/2011   04/28/15 US carotid R+L normal bilateral arterial velocities.    Marland Kitchen Dyspnea 08/11/2014   Followed in Pulmonary clinic/ [facility]/ [place]  - 08/11/2014  Walked RA x 1 laps @ 185 ft each stopped due to fatigue/off balance/ slow pace/  no sob or desat  - PFT's  09/26/2014  FEV1 2.26 (85 % ) ratio 76  p no % improvement from saba with DLCO  67 % corrects to 93 % for alv volume      Since prev study 08/04/13 minimal change lung vol or dlco    . Embolic cerebral infarction ([place]) 12/06/2015  . Exertional shortness of breath    "sometimes walking" (12/15/2012)  . GERD (gastroesophageal reflux disease)   . Gout 02/09/2015  . Heart murmur    "just told I had one today" (12/15/2012)  . Hiatal hernia   . Hyperlipidemia   . Hypertension   . Hypothyroid   . Influenza A 03/10/2016  . Insomnia   . Melanoma of back ([place]) 1976  . Myocardial infarction 1996; 2011   "both silent" (12/15/2012)  . Orthostatic hypotension   . Osteoporosis, senile   . Pacemaker   . RBBB   . Restless leg 02/02/2015  . Right leg weakness 12/06/2015  .  [facility] spotted fever   . S/P CABG x 4   . Sick sinus syndrome ([place]) 01/31/2014  . Sustained ventricular tachycardia ([place]) 07/27/2014  . Urinary retention 12/30/2014    Past Surgical History:  Procedure Laterality Date  . CARDIAC CATHETERIZATION  04/2010   LIMA to LAD patent,SVG to OM patent,no in-stnet restenosis RCA  . CATARACT EXTRACTION W/ INTRAOCULAR LENS  IMPLANT, BILATERAL Bilateral 2012  . CORONARY ANGIOPLASTY WITH STENT PLACEMENT  07/2009   bare metal stent to SVG to the RCA  . CORONARY ARTERY BYPASS GRAFT  1996   LIMA to LAD,SVG to RCA & SVG to OM  . INSERT / REPLACE / REMOVE  PACEMAKER  2010  . MELANOMA EXCISION  05/1974 X2   "taken off my back" (12/15/2012)  . NM MYOVIEW LTD  06/2011   low risk  . TONSILLECTOMY  1938  . TRANSURETHRAL RESECTION OF PROSTATE  1986  . US ECHOCARDIOGRAPHY  07/11/2009   EF 45-50%    Patient Care Team: Estill Dooms, MD as PCP - General (Internal Medicine) [name], MD as Attending Physician (Cardiology) Vevelyn Royals, MD as Consulting Physician (Ophthalmology) Franchot Gallo, MD as Consulting Physician (Urology) Tanda Rockers, MD as Consulting Physician (Pulmonary Disease) Allyn Kenner, MD (Dermatology) Deliah Goody, PA-C as Physician Assistant (Physician Assistant) Man Otho Darner, NP as Nurse Practitioner (Internal Medicine)  Social History   Social History  . Marital status: Married    Spouse name: N/A  . Number of children: 2  . Years of education: Masters   Occupational History  . Retired Company secretary -Pensions consultant    Social History Main Topics  . Smoking status: Never Smoker  . Smokeless tobacco: Never Used  . Alcohol use No  . Drug use: No  . Sexual activity: No   Other Topics Concern  . Not on file   Social History Narrative   Lives at [place] to [place] 01/09/15   Married - Violet   Never smoked   Alcohol none   Previously employed as Scientist, forensic for [employer].         Diet:Low sodium   Do you drink/eat things with caffeine? No   Marital status: Married                              What year were you married?1950   Do you live in a house, apartment, assisted living, condo, trailer, etc)?    Is it one or more stories? 1   How many persons live in your home? 2   Do you have any pets in your home? No   Current or past profession: Minister, [employer]   Do you exercise?     Very Little                                                 Type & how often:    Do you have a living will?  Yes   Do you have a DNR Form? Yes   Do you have a POA/HPOA forms? Yes     reports that he has never smoked. He has never used smokeless tobacco. He reports that he does not drink alcohol or use drugs.  Family History  Problem Relation Age of Onset  . Coronary  artery disease Mother   . Diabetes Mother   . Heart disease Mother   . Coronary artery disease Father   . Diabetes Father   . Lung cancer Father   . Heart disease Father   . Anuerysm Son   . Heart disease Brother    Family Status  Relation Status  . Mother Deceased  . Father Deceased  . Maternal Grandmother Deceased  . Maternal Grandfather Deceased  . Paternal Grandmother Deceased  . Paternal Grandfather Deceased  . Sister Alive  . Son Deceased  . Brother Deceased  . Daughter Alive    Immunization History  Administered Date(s) Administered  . Influenza-Unspecified 12/05/2013, 11/05/2014, 11/16/2015  . PPD Test 03/07/2014  . Pneumococcal-Unspecified 10/05/2009    Allergies  Allergen Reactions  . Altace [Ramipril] Cough  . Crestor [Rosuvastatin Calcium] Rash  . Penicillins Rash and Other (See Comments)    Has patient had a PCN reaction causing immediate rash, facial/tongue/throat swelling, SOB or lightheadedness with hypotension: No Has patient had a PCN reaction causing severe rash involving mucus membranes or skin necrosis: Yes Has patient had a PCN reaction that required hospitalization Yes Has patient had a PCN reaction occurring within the last 10 years: No If all of the above answers are "NO", then may proceed with Cephalosporin use.   . Sulfa Antibiotics Rash    Medications: Patient's Medications  New Prescriptions   No medications on file  Previous Medications   AMIODARONE (PACERONE) 200 MG TABLET    Take 400 mg by mouth daily.   ASPIRIN EC 81 MG TABLET    Take 1 tablet (81 mg total) by mouth daily.   CHOLECALCIFEROL (VITAMIN D3) 5000 UNITS CAPS    Take 5,000 Units by mouth every Monday.    CIPROFLOXACIN (CIPRO) 500 MG TABLET    Take 1 tablet (500 mg total) by mouth 2  (two) times daily.   CLOBETASOL (TEMOVATE) 0.05 % EXTERNAL SOLUTION    Apply 1 application topically as needed (for itchy scalp).    CLONAZEPAM (KLONOPIN) 0.5 MG TABLET    Take 0.5 mg by mouth 3 (three) times daily as needed for anxiety.   CLOPIDOGREL (PLAVIX) 75 MG TABLET    Take 1 tablet (75 mg total) by mouth daily.   LEVOTHYROXINE (SYNTHROID, LEVOTHROID) 137 MCG TABLET    Take 1 tablet (137 mcg total) by mouth daily before breakfast.   LUBIPROSTONE (AMITIZA) 24 MCG CAPSULE    Take 24 mcg by mouth at bedtime.    MIDODRINE (PROAMATINE) 5 MG TABLET    Take 5 mg by mouth 3 (three) times daily with meals.   OSELTAMIVIR (TAMIFLU) 75 MG CAPSULE    Take 1 capsule (75 mg total) by mouth 2 (two) times daily.   PANTOPRAZOLE (PROTONIX) 40 MG TABLET    Take 40 mg by mouth at bedtime.   POLYETHYLENE GLYCOL (MIRALAX / GLYCOLAX) PACKET    Take 17 g by mouth daily as needed for mild constipation.   PRAVASTATIN (PRAVACHOL) 40 MG TABLET    Take 40 mg by mouth daily at 12 noon.   ROPINIROLE (REQUIP) 0.5 MG TABLET    Take 0.5 mg by mouth 3 (three) times daily as needed (for restless leg syndrome).   SENNA-DOCUSATE (SENOKOT-S) 8.6-50 MG TABLET    Take 4 tablets by mouth daily as needed for mild constipation.   TAMSULOSIN (FLOMAX) 0.4 MG CAPS CAPSULE    Take 0.4 mg by mouth at bedtime.    TORSEMIDE (DEMADEX) 20 MG  TABLET    Take 20 mg by mouth daily as needed (for weight gain of 3 or more lbs).    TRAZODONE (DESYREL) 50 MG TABLET    Take 50 mg by mouth at bedtime.   TRIAMCINOLONE CREAM (KENALOG) 0.1 %    Apply 1 application topically 2 (two) times daily as needed (for irritation).  Modified Medications   No medications on file  Discontinued Medications   MAGNESIUM SULFATE (EPSOM SALT) POWD    1 application by Does not apply route at bedtime. Pt is to soak feet once daily.    Review of Systems  Constitutional: Positive for appetite change and fatigue. Negative for activity change, fever and unexpected weight  change.  HENT: Positive for hearing loss and tinnitus. Negative for congestion, ear pain, rhinorrhea, sore throat, trouble swallowing and voice change.   Eyes: Negative for photophobia and pain.       Corrective lenses  Respiratory: Positive for shortness of breath. Negative for cough, choking, chest tightness and wheezing.   Cardiovascular: Positive for leg swelling (2+ and usingcompression hosiery). Negative for chest pain and palpitations.       Congestive cardiomyopathy. Coronary artery disease, status post CABG. Cardiac arrhythmias, status post pacemaker implanted left upper chest.  Gastrointestinal: Positive for constipation. Negative for abdominal distention, abdominal pain, diarrhea, nausea and vomiting.  Endocrine: Negative for cold intolerance, heat intolerance, polydipsia, polyphagia and polyuria.  Genitourinary: Positive for enuresis and frequency. Negative for dysuria, hematuria, testicular pain and urgency.       Double voids. Urgency. Frequency. Nocturia x 7.  Musculoskeletal: Positive for gait problem (using rolling walker). Negative for arthralgias, back pain, myalgias and neck pain.       Ambulates with walker.   Skin: Positive for rash (both arms. Seborrhea capitis.) and wound (improvement in left 2nd toe medial side ulcereation and swelling and erythema of the entire toe.). Negative for color change and pallor.       Chronic medial right buttock pressure ulcer.  Allergic/Immunologic: Negative.   Neurological: Positive for dizziness, speech difficulty (sometimes has difficulty with word finding) and weakness. Negative for tremors, syncope, numbness and headaches.       MRI brain disclosed cerebral atrophy and microvascular disease.  Insomnia and restless legs are improved. Patient reports that he has H/O dizziness. This makes the patient feel unbalanced. R hand numbness.  Cognitive decline since 2014. Restless legs day and night.  Hematological: Negative for adenopathy. Does  not bruise/bleed easily.  Psychiatric/Behavioral: Positive for sleep disturbance (nocturia x 7). Negative for behavioral problems, confusion, decreased concentration, hallucinations and suicidal ideas. The patient is not nervous/anxious.        Not enough sleep, decreased energy     Vitals:   03/14/16 1253  BP: 132/66  Pulse: 71  Resp: 20  Temp: 98 F (36.7 C)  SpO2: 98%  Weight: 179 lb 3.2 oz (81.3 kg)  Height: '5\' 11"'  (1.803 m)   Body mass index is 24.99 kg/m. Filed Weights   03/14/16 1253  Weight: 179 lb 3.2 oz (81.3 kg)     Physical Exam  Constitutional: He is oriented to person, place, and time. He appears well-developed and well-nourished. No distress.  Frail.  HENT:  Head: Normocephalic and atraumatic.  Right Ear: External ear normal.  Left Ear: External ear normal.  Nose: Nose normal.  Mouth/Throat: Oropharynx is clear and moist. No oropharyngeal exudate.  Eyes: Conjunctivae and EOM are normal. Pupils are equal, round, and reactive to light. Right eye  exhibits no discharge. Left eye exhibits no discharge.  Neck: Normal range of motion. Neck supple. No JVD present. No tracheal deviation present. No thyromegaly present.  Cardiovascular: Normal rate, regular rhythm and intact distal pulses.  Exam reveals no gallop and no friction rub.   Murmur heard. 0-9/3 systolic  Pulmonary/Chest: Effort normal and breath sounds normal. No respiratory distress. He has no wheezes. He has no rales. He exhibits no tenderness.  Abdominal: He exhibits no distension and no mass. There is no tenderness.  abd hernia  Genitourinary: No penile tenderness.  Musculoskeletal: Normal range of motion. He exhibits edema (trace to 1+ bipedal). He exhibits no tenderness.  Gait disturbance and loss of balance. Patient arrives in a  wheelchair today.  Lymphadenopathy:    He has no cervical adenopathy.  Neurological: He is alert and oriented to person, place, and time. He has normal reflexes. No  cranial nerve deficit. He exhibits normal muscle tone. Coordination normal.  Skin: Skin is warm and dry. No rash noted. No erythema. No pallor.  Ecchymoses of both arms. Improvement of the red bumps that are pruritic on both arms. Seborrhea of the scalp. Improvement in the cellulitis of the left 2nd toe. Ulcer medially of the left 2nd toe.  Psychiatric: He has a normal mood and affect. His behavior is normal. Judgment and thought content normal.    Labs reviewed: Lab Summary Latest Ref Rng & Units 03/11/2016 03/10/2016 01/11/2016  Hemoglobin 13.0 - 17.0 g/dL 12.0(L) 13.1 13.9  Hematocrit 39.0 - 52.0 % 36.9(L) 39.5 42.2  White count 4.0 - 10.5 K/uL 6.3 8.3 6.4  Platelet count 150 - 400 K/uL 176 190 188  Sodium 135 - 145 mmol/L 139 140 140  Potassium 3.5 - 5.1 mmol/L 3.7 3.5 4.1  Calcium 8.9 - 10.3 mg/dL 8.0(L) 8.7(L) 8.8(L)  Phosphorus - (None) (None) (None)  Creatinine 0.61 - 1.24 mg/dL 0.72 1.03 0.79  AST 15 - 41 U/L 46(H) 53(H) (None)  Alk Phos 38 - 126 U/L 51 61 (None)  Bilirubin 0.3 - 1.2 mg/dL 0.4 0.6 (None)  Glucose 65 - 99 mg/dL 105(H) 96 97  Cholesterol - (None) (None) (None)  HDL cholesterol - (None) (None) (None)  Triglycerides - (None) (None) (None)  LDL Direct - (None) (None) (None)  LDL Calc - (None) (None) (None)  Total protein 6.5 - 8.1 g/dL 5.8(L) 6.9 (None)  Albumin 3.5 - 5.0 g/dL 3.1(L) 3.7 (None)  Some recent data might be hidden   Lab Results  Component Value Date   BUN 18 03/11/2016   Lab Results  Component Value Date   HGBA1C 5.6 12/07/2015   Lab Results  Component Value Date   TSH 2.58 11/27/2015          Dg Chest 2 View  Result Date: 03/10/2016 CLINICAL DATA:  Cough.  Influenza EXAM: CHEST  2 VIEW COMPARISON:  01/11/2016 FINDINGS: Large hiatal hernia. Increased streaky opacity at the left base around the hernia. Chronic cardiopericardial enlargement. Dual-chamber pacer leads from the left. Status post CABG. No edema, effusion, or pneumothorax.  IMPRESSION: 1. Increased atelectasis around the patient's large hiatal hernia. No definitive pneumonia. 2. Cardiomegaly without failure. Electronically Signed   By: [place] Fantasia M.D.   On: 03/10/2016 14:35     Assessment/Plan  1. Influenza A Recovering. Has gained strength since admission to SNF.  2. Weak Appetite is better. Dyspnea resolved Engaged in PT and OT  3. Chronic combined systolic and diastolic congestive heart failure (HCC) compensated  4.  Cardiomyopathy, ischemic unchanged  5. Essential hypertension controlled  6. Insomnia, unspecified type still with some restless night  7. RLS (restless legs syndrome) Has improved with ropinerole.   8. Urinary retention Foley cath in place See Dr.Dahlstedt  9. Cellulitis of toe of left foot Remains on Cipro. Improving.  10. Chronic anticoagulation Plavix for hx CVA.

## 2016-03-15 LAB — CULTURE, BLOOD (ROUTINE X 2)
Culture: NO GROWTH
Culture: NO GROWTH

## 2016-03-15 LAB — CUP PACEART INCLINIC DEVICE CHECK
Date Time Interrogation Session: 20180209092555
Implantable Lead Implant Date: 20100309
Implantable Lead Implant Date: 20100309
Implantable Lead Location: 753859
Implantable Lead Location: 753860
Implantable Pulse Generator Implant Date: 20100309
Lead Channel Setting Pacing Amplitude: 0.875
Lead Channel Setting Pacing Amplitude: 2.5 V
Lead Channel Setting Pacing Pulse Width: 0.4 ms
Lead Channel Setting Sensing Sensitivity: 2 mV
Pulse Gen Model: 2210
Pulse Gen Serial Number: 2173363

## 2016-03-22 ENCOUNTER — Other Ambulatory Visit: Payer: Self-pay | Admitting: Internal Medicine

## 2016-03-27 ENCOUNTER — Telehealth: Payer: Self-pay | Admitting: Cardiovascular Disease

## 2016-03-27 NOTE — Telephone Encounter (Signed)
Returned call to wife (ok per DPR)-wife was inquiring about a cough that patient has had for "years". States he is going through a pack of cough drops a day and she is searching for the cause of this cough.  Patient recently hospitalized for flu and is currently at a rehab facility due to weakness.  Wife was concerned the medication midodrine could be causing this cough.  Wife states cough is non productive, denies SOB, only weakness from flu, states "he is learning to walk again" and is "very determined".    Advised wife that I spoke to pharmacist in regards to midodrine and advised this is unlikely to be the cause, pharmacist advised this is more than likely not related to patients current medications.  Voiced concern of cough from lingering from the flu as well as concern for infection (post hospital) but advised this is very unlikely if it has been occurring for "years" and has no other signs or symptoms.   Advised to continue to monitor for fever, productive cough, SOB, etc.  Otherwise referred back to PCP.  Advised I would also route to Dr. Sallyanne Kuster for review to see if he has any further suggestions or ideas.  Wife agreed and verbalized understanding.

## 2016-03-27 NOTE — Telephone Encounter (Signed)
Returning your call,concerning pt's cough.

## 2016-03-27 NOTE — Telephone Encounter (Signed)
Adam [name] is going thru [place] like one pack after another . He has a chronic cough . They are asking if the Midodrine be reduced or if that can be the cause of the chronic cough . Please call   Thanks

## 2016-03-27 NOTE — Telephone Encounter (Signed)
Left msg to call.

## 2016-03-28 NOTE — Telephone Encounter (Signed)
Agree with your answer. Sorry, I have no explanation for the cough.

## 2016-03-29 ENCOUNTER — Non-Acute Institutional Stay (SKILLED_NURSING_FACILITY): Payer: Medicare Other | Admitting: Nurse Practitioner

## 2016-03-29 ENCOUNTER — Encounter: Payer: Self-pay | Admitting: Nurse Practitioner

## 2016-03-29 DIAGNOSIS — M79601 Pain in right arm: Secondary | ICD-10-CM

## 2016-03-29 DIAGNOSIS — K2100 Gastro-esophageal reflux disease with esophagitis, without bleeding: Secondary | ICD-10-CM

## 2016-03-29 DIAGNOSIS — G4700 Insomnia, unspecified: Secondary | ICD-10-CM

## 2016-03-29 DIAGNOSIS — E039 Hypothyroidism, unspecified: Secondary | ICD-10-CM | POA: Diagnosis not present

## 2016-03-29 DIAGNOSIS — R339 Retention of urine, unspecified: Secondary | ICD-10-CM | POA: Diagnosis not present

## 2016-03-29 DIAGNOSIS — G2581 Restless legs syndrome: Secondary | ICD-10-CM

## 2016-03-29 DIAGNOSIS — K5900 Constipation, unspecified: Secondary | ICD-10-CM | POA: Diagnosis not present

## 2016-03-29 DIAGNOSIS — F418 Other specified anxiety disorders: Secondary | ICD-10-CM

## 2016-03-29 DIAGNOSIS — I951 Orthostatic hypotension: Secondary | ICD-10-CM

## 2016-03-29 NOTE — Assessment & Plan Note (Signed)
Taking Midodrine tid with meals for orthostatic hypotension.

## 2016-03-29 NOTE — Assessment & Plan Note (Signed)
Prn Requip and Clonazepam available to him 

## 2016-03-29 NOTE — Assessment & Plan Note (Addendum)
Managed with daily Amitiza 24mcg qhs, prn MiraLax, prn Senna S 03/29/16 KUB no bowel obstruction,   

## 2016-03-29 NOTE — Assessment & Plan Note (Signed)
Taking Trazodone 50mg at night.  

## 2016-03-29 NOTE — Assessment & Plan Note (Signed)
Stable, continue Protonix daily.  

## 2016-03-29 NOTE — Assessment & Plan Note (Signed)
No urinary retention, taking Tamsulosin 0.4mg  daily.

## 2016-03-29 NOTE — Assessment & Plan Note (Signed)
Taking Levothyroxine 137mcg, last TSH 2.58 11/27/15 

## 2016-03-29 NOTE — Progress Notes (Signed)
Location:   [facility] Room Number: 83 Place of Service:  SNF (31) Provider: Lennie Odor Landan Fedie NP  Jeanmarie Hubert, MD  Patient Care Team: Estill Dooms, MD as PCP - General (Internal Medicine) [name], MD as Attending Physician (Cardiology) Vevelyn Royals, MD as Consulting Physician (Ophthalmology) Franchot Gallo, MD as Consulting Physician (Urology) Tanda Rockers, MD as Consulting Physician (Pulmonary Disease) Allyn Kenner, MD (Dermatology) Deliah Goody, PA-C as Physician Assistant (Physician Assistant) [place] Lasseter Otho Darner, NP as Nurse Practitioner (Internal Medicine)  Extended Emergency Contact Information Primary Emergency Contact: Cephus,[name] Address: [place], [place] Phone: [phone] Mobile Phone: [phone] Relation: Spouse Secondary Emergency Contact: [name],[place]  [place] Phone: [phone] Mobile Phone: [phone] Relation: Daughter  Code Status:  DNR Goals of care: Advanced Directive information Advanced Directives 04/01/2016  Does Patient Have a Medical Advance Directive? Yes  Type of Paramedic of [place];Living will  Does patient want to make changes to medical advance directive? -  Copy of [place] in Chart? Yes  Would patient like information on creating a medical advance directive? -  Pre-existing out of facility DNR order (yellow form or pink MOST form) -     Chief Complaint  Patient presents with  . Acute Visit    Low B/P    HPI:  Pt is a 83 y.o. male seen today for an acute visit for staff concern of orthostatic hypotension, takes Midodrine tid a day, the patient was instructed to change position with caution.   R elbow warm, pain, swelling, Hx of gout, no reduced ROM, X-ray of the right humerus and elbow showed no acute fx or dislocation.   Hospitalized from 03/10/16 to 03/11/16 for Flu, treated with Tamiflu, Cipro for  cellulitis of the right 2nd toe.    Hx of VT, taking Amiodarone 415m daily, CHF managed with prn Torsemide, taking Levothyroxine 1343m, last TSH 2.58 11/27/15, BPH, managed with Tamsulosin 0.73m38maily, sleeps with aid of Trazodone 83m13m night. His mood is stable, prn Clonazepam available to him. RLS prn Requip and Clonazepam available to him.   Past Medical History:  Diagnosis Date  . Arthritis    "minor, back and sometimes knees" (12/15/2012)  . Bradycardia    AFib/SSS s/p St Jude PPM 04/12/2008  . CAD (coronary artery disease) 12/30/2014   CABG (LIMA-LAD, SVG-RCA, SVG-OM in 1996).  07/2009 BMS to SVG-RCA. Cath in 04/2010 with patent stents   . Cardiomyopathy, ischemic 08/25/2012  . CHF (congestive heart failure) (HCC)[place]. Chronic knee pain 12/03/2014  . Combined congestive systolic and diastolic heart failure (HCC)[place]/29/2016   Hx EF 41%. BNP 96.8 02/21/15 Torsemide 04/06/15 Na 142, K 4.6, Bun 16, creat 0.89 04/20/14 BNP 111.7, Na 142, K 4.6, Bun 16, creat 0.9   . Depression with anxiety 02/02/2015   02/21/15 Hgb A1c 5.8 03/10/15 MMSE 30/30   . Dizziness, after diuretic asscoiated with hypotension and responded to fluid bolus 06/05/2011   04/28/15 US c[place]oreaotid R+L normal bilateral arterial velocities.    . DyMarland Kitchenpnea 08/11/2014   Followed in Pulmonary clinic/ [facility]/ [place]  - 08/11/2014  Walked RA x 1 laps @ 185 ft each stopped due to fatigue/off balance/ slow pace/  no sob or desat  - PFT's  09/26/2014  FEV1 2.26 (85 % ) ratio 76  p no % improvement from saba with DLCO  67 % corrects to 93 % for alv volume      Since prev study 08/04/13 minimal change lung vol or dlco    . Embolic cerebral infarction ([place]) 12/06/2015  . Exertional shortness of breath    "sometimes walking" (12/15/2012)  . GERD (gastroesophageal reflux disease)   . Gout 02/09/2015  . Heart murmur    "just told I had one today" (12/15/2012)  . Hiatal hernia   . Hyperlipidemia   . Hypertension   . Hypothyroid   . Influenza A  03/10/2016  . Insomnia   . Melanoma of back ([place]) 1976  . Myocardial infarction 1996; 2011   "both silent" (12/15/2012)  . Nonrheumatic aortic valve stenosis   . Orthostatic hypotension   . Osteoporosis, senile   . Pacemaker   . RBBB   . Restless leg 02/02/2015  . Right leg weakness 12/06/2015  . [facility] spotted fever   . S/P CABG x 4   . Sick sinus syndrome ([place]) 01/31/2014  . Sustained ventricular tachycardia ([place]) 07/27/2014  . Urinary retention 12/30/2014   Past Surgical History:  Procedure Laterality Date  . CARDIAC CATHETERIZATION  04/2010   LIMA to LAD patent,SVG to OM patent,no in-stnet restenosis RCA  . CATARACT EXTRACTION W/ INTRAOCULAR LENS  IMPLANT, BILATERAL Bilateral 2012  . CORONARY ANGIOPLASTY WITH STENT PLACEMENT  07/2009   bare metal stent to SVG to the RCA  . CORONARY ARTERY BYPASS GRAFT  1996   LIMA to LAD,SVG to RCA & SVG to OM  . INSERT / REPLACE / REMOVE PACEMAKER  2010  . MELANOMA EXCISION  05/1974 X2   "taken off my back" (12/15/2012)  . NM MYOVIEW LTD  06/2011   low risk  . TONSILLECTOMY  1938  . TRANSURETHRAL RESECTION OF PROSTATE  1986  . US ECHOCARDIOGRAPHY  07/11/2009   EF 45-50%    Allergies  Allergen Reactions  . Altace [Ramipril] Cough  . Crestor [Rosuvastatin Calcium] Rash  . Penicillins Rash and Other (See Comments)    Has patient had a PCN reaction causing immediate rash, facial/tongue/throat swelling, SOB or lightheadedness with hypotension: No Has patient had a PCN reaction causing severe rash involving mucus membranes or skin necrosis: Yes Has patient had a PCN reaction that required hospitalization Yes Has patient had a PCN reaction occurring within the last 10 years: No If all of the above answers are "NO", then may proceed with Cephalosporin use.   . Sulfa Antibiotics Rash    Allergies as of 03/29/2016      Reactions   Altace [ramipril] Cough   Crestor [rosuvastatin Calcium] Rash   Penicillins Rash, Other (See Comments)     Has patient had a PCN reaction causing immediate rash, facial/tongue/throat swelling, SOB or lightheadedness with hypotension: No Has patient had a PCN reaction causing severe rash involving mucus membranes or skin necrosis: Yes Has patient had a PCN reaction that required hospitalization Yes Has patient had a PCN reaction occurring within the last 10 years: No If all of the above answers are "NO", then may proceed with Cephalosporin use.   Sulfa Antibiotics Rash      Medication List       Accurate as of 03/29/16 11:59 PM. Always use your most recent med list.          amiodarone 200 MG tablet Commonly known as:  PACERONE Take 400 mg by mouth daily.   aspirin EC 81 MG tablet Take 1 tablet (81 mg total) by mouth daily.  clobetasol 0.05 % external solution Commonly known as:  TEMOVATE Apply 1 application topically as needed (for itchy scalp).   clonazePAM 0.5 MG tablet Commonly known as:  KLONOPIN Take 0.5 mg by mouth 3 (three) times daily as needed for anxiety.   clopidogrel 75 MG tablet Commonly known as:  PLAVIX Take 1 tablet (75 mg total) by mouth daily.   levothyroxine 137 MCG tablet Commonly known as:  SYNTHROID, LEVOTHROID Take 1 tablet (137 mcg total) by mouth daily before breakfast.   lubiprostone 24 MCG capsule Commonly known as:  AMITIZA Take 24 mcg by mouth at bedtime.   midodrine 5 MG tablet Commonly known as:  PROAMATINE Take 5 mg by mouth 3 (three) times daily with meals.   pantoprazole 40 MG tablet Commonly known as:  PROTONIX Take 40 mg by mouth at bedtime.   polyethylene glycol packet Commonly known as:  MIRALAX / GLYCOLAX Take 17 g by mouth daily as needed for mild constipation.   pravastatin 40 MG tablet Commonly known as:  PRAVACHOL Take 1 tablet by mouth daily.   rOPINIRole 0.5 MG tablet Commonly known as:  REQUIP Take 0.5 mg by mouth 3 (three) times daily as needed (for restless leg syndrome).   senna-docusate 8.6-50 MG  tablet Commonly known as:  Senokot-S Take 4 tablets by mouth daily as needed for mild constipation.   tamsulosin 0.4 MG Caps capsule Commonly known as:  FLOMAX Take 0.4 mg by mouth at bedtime.   torsemide 20 MG tablet Commonly known as:  DEMADEX Take 20 mg by mouth daily as needed (for weight gain of 3 or more lbs).   traZODone 50 MG tablet Commonly known as:  DESYREL Take 50 mg by mouth at bedtime.   triamcinolone cream 0.1 % Commonly known as:  KENALOG Apply 1 application topically 2 (two) times daily as needed (for irritation).   Vitamin D3 5000 units Caps Take 5,000 Units by mouth every Monday.       Review of Systems  Constitutional: Positive for appetite change and fatigue. Negative for activity change, fever and unexpected weight change.  HENT: Positive for hearing loss and tinnitus. Negative for congestion, ear pain, rhinorrhea, sore throat, trouble swallowing and voice change.   Eyes: Negative for photophobia and pain.       Corrective lenses  Respiratory: Positive for shortness of breath. Negative for cough, choking, chest tightness and wheezing.   Cardiovascular: Positive for leg swelling (2+ and usingcompression hosiery). Negative for chest pain and palpitations.       Congestive cardiomyopathy. Coronary artery disease, status post CABG. Cardiac arrhythmias, status post pacemaker implanted left upper chest.  Gastrointestinal: Positive for constipation. Negative for abdominal distention, abdominal pain, diarrhea, nausea and vomiting.  Endocrine: Negative for cold intolerance, heat intolerance, polydipsia, polyphagia and polyuria.  Genitourinary: Positive for enuresis and frequency. Negative for dysuria, hematuria, testicular pain and urgency.       Double voids. Urgency. Frequency. Nocturia x 7.  Musculoskeletal: Positive for gait problem (using rolling walker). Negative for arthralgias, back pain, myalgias and neck pain.       Ambulates with walker.   Skin:  Positive for rash (both arms. Seborrhea capitis.) and wound (improvement in left 2nd toe medial side ulcereation and swelling and erythema of the entire toe.). Negative for color change and pallor.       Chronic medial right buttock pressure ulcer. R elbow warm, pain, swelling, Hx of gout, no reduced ROM, X-ray of the right humerus and elbow showed  no acute fx or dislocation.    Allergic/Immunologic: Negative.   Neurological: Positive for dizziness, speech difficulty (sometimes has difficulty with word finding) and weakness. Negative for tremors, syncope, numbness and headaches.       MRI brain disclosed cerebral atrophy and microvascular disease.  Insomnia and restless legs are improved. Patient reports that he has H/O dizziness. This makes the patient feel unbalanced. R hand numbness.  Cognitive decline since 2014. Restless legs day and night.  Hematological: Negative for adenopathy. Does not bruise/bleed easily.  Psychiatric/Behavioral: Positive for sleep disturbance (nocturia x 7). Negative for behavioral problems, confusion, decreased concentration, hallucinations and suicidal ideas. The patient is not nervous/anxious.        Not enough sleep, decreased energy     Immunization History  Administered Date(s) Administered  . Influenza-Unspecified 12/05/2013, 11/05/2014, 11/16/2015  . PPD Test 03/07/2014  . Pneumococcal-Unspecified 10/05/2009   Pertinent  Health Maintenance Due  Topic Date Due  . PNA vac Low Risk Adult (2 of 2 - PCV13) 10/06/2010  . DEXA SCAN  12/14/2024  . INFLUENZA VACCINE  Completed   Fall Risk  10/30/2015 09/05/2015 02/02/2015  Falls in the past year? No No No  Risk for fall due to : Impaired balance/gait - -   Functional Status Survey:    Vitals:   03/29/16 1346  BP: 108/73  Pulse: 68  Resp: 16  Temp: 97.6 F (36.4 C)  SpO2: 98%  Weight: 174 lb 4.8 oz (79.1 kg)  Height: '5\' 11"'  (1.803 m)   Body mass index is 24.31 kg/m. Physical Exam   Constitutional: He is oriented to person, place, and time. He appears well-developed and well-nourished. No distress.  Frail.  HENT:  Head: Normocephalic and atraumatic.  Right Ear: External ear normal.  Left Ear: External ear normal.  Nose: Nose normal.  Mouth/Throat: Oropharynx is clear and moist. No oropharyngeal exudate.  Eyes: Conjunctivae and EOM are normal. Pupils are equal, round, and reactive to light. Right eye exhibits no discharge. Left eye exhibits no discharge.  Neck: Normal range of motion. Neck supple. No JVD present. No tracheal deviation present. No thyromegaly present.  Cardiovascular: Normal rate, regular rhythm and intact distal pulses.  Exam reveals no gallop and no friction rub.   Murmur heard. 6-4/8 systolic  Pulmonary/Chest: Effort normal and breath sounds normal. No respiratory distress. He has no wheezes. He has no rales. He exhibits no tenderness.  Abdominal: He exhibits no distension and no mass. There is no tenderness.  abd hernia  Genitourinary: No penile tenderness.  Musculoskeletal: Normal range of motion. He exhibits edema (trace to 1+ bipedal). He exhibits no tenderness.  Gait disturbance and loss of balance. Patient arrives in a  wheelchair today.  Lymphadenopathy:    He has no cervical adenopathy.  Neurological: He is alert and oriented to person, place, and time. He has normal reflexes. No cranial nerve deficit. He exhibits normal muscle tone. Coordination normal.  Skin: Skin is warm and dry. No rash noted. No erythema. No pallor.  Ecchymoses of both arms. Improvement of the red bumps that are pruritic on both arms. Seborrhea of the scalp. Improvement in the cellulitis of the left 2nd toe. Ulcer medially of the left 2nd toe. R elbow warm, pain, swelling, Hx of gout, no reduced ROM, X-ray of the right humerus and elbow showed no acute fx or dislocation.    Psychiatric: He has a normal mood and affect. His behavior is normal. Judgment and thought  content normal.  Labs reviewed:  Recent Labs  01/11/16 0537 03/10/16 1443 03/11/16 0436  NA 140 140 139  K 4.1 3.5 3.7  CL 106 104 108  CO2 '27 29 25  ' GLUCOSE 97 96 105*  BUN 13 22* 18  CREATININE 0.79 1.03 0.72  CALCIUM 8.8* 8.7* 8.0*    Recent Labs  01/10/16 1400 03/10/16 1443 03/11/16 0436  AST 25 53* 46*  ALT '22 26 23  ' ALKPHOS 68 61 51  BILITOT 0.4 0.6 0.4  PROT 6.4* 6.9 5.8*  ALBUMIN 3.5 3.7 3.1*    Recent Labs  12/06/15 1617 01/10/16 1400  01/11/16 0537 03/10/16 1443 03/11/16 0436  WBC 5.4 6.0  --  6.4 8.3 6.3  NEUTROABS 3.3 4.1  --   --  5.8  --   HGB 13.5 14.1  < > 13.9 13.1 12.0*  HCT 41.4 42.7  < > 42.2 39.5 36.9*  MCV 98.6 97.7  --  97.2 95.6 95.8  PLT 183 190  --  188 190 176  < > = values in this interval not displayed. Lab Results  Component Value Date   TSH 2.58 11/27/2015   Lab Results  Component Value Date   HGBA1C 5.6 12/07/2015   Lab Results  Component Value Date   CHOL 84 12/07/2015   HDL 41 12/07/2015   LDLCALC 23 12/07/2015   TRIG 102 12/07/2015   CHOLHDL 2.0 12/07/2015    Significant Diagnostic Results in last 30 days:  Dg Chest 2 View  Result Date: 03/10/2016 CLINICAL DATA:  Cough.  Influenza EXAM: CHEST  2 VIEW COMPARISON:  01/11/2016 FINDINGS: Large hiatal hernia. Increased streaky opacity at the left base around the hernia. Chronic cardiopericardial enlargement. Dual-chamber pacer leads from the left. Status post CABG. No edema, effusion, or pneumothorax. IMPRESSION: 1. Increased atelectasis around the patient's large hiatal hernia. No definitive pneumonia. 2. Cardiomegaly without failure. Electronically Signed   By: Monte Fantasia M.D.   On: 03/10/2016 14:35    Assessment/Plan Constipation Managed with daily Amitiza 77mg qhs, prn MiraLax, prn Senna S 03/29/16 KUB no bowel obstruction,   GERD (gastroesophageal reflux disease) Stable, continue Protonix daily.   Hypothyroidism Taking Levothyroxine 131m, last  TSH 2.58 11/27/15  Urinary retention No urinary retention, taking Tamsulosin 0.51m61maily.   Insomnia Taking Trazodone 64m58m night.   Depression with anxiety Stable, continue Trazodone 64mg46m prn Clonazepam.   Orthostatic hypotension Taking Midodrine tid with meals for orthostatic hypotension.   RLS (restless legs syndrome) Prn Requip and Clonazepam available to him  Right arm pain 04/01/16 X-ray R humerus and elbow: no definite acute fx or dislocation. Chronic rotator cuff tear R elbow warm, pain, swelling, Hx of gout, no reduced ROM, X-ray of the right humerus and elbow showed no acute fx or dislocation.  Gout vs cellulitis vs DVT, update uric acid, ESR, RAF, CBC, CMP, venous US RUKoreaMedrol dose pk, continue Mobic, Doxycycline 100mg 751mx 7 days    Family/ staff Communication: IL with wife when able.   Labs/tests ordered:  Uric acid, RAF, ESR, CBC, CMP, venous US RUE[place]orea

## 2016-03-29 NOTE — Progress Notes (Signed)
Location:  [facility] Room Number: 35 Place of Service:  SNF (31) Provider:  Mast, Manxie NP  Jeanmarie Hubert, MD  Patient Care Team: Estill Dooms, MD as PCP - General (Internal Medicine) [name], MD as Attending Physician (Cardiology) Vevelyn Royals, MD as Consulting Physician (Ophthalmology) Franchot Gallo, MD as Consulting Physician (Urology) Tanda Rockers, MD as Consulting Physician (Pulmonary Disease) Allyn Kenner, MD (Dermatology) Deliah Goody, PA-C as Physician Assistant (Physician Assistant) Man Otho Darner, NP as Nurse Practitioner (Internal Medicine)  Extended Emergency Contact Information Primary Emergency Contact: Nase,[name] Address: [place], [place] Phone: [phone] Mobile Phone: [phone] Relation: Spouse Secondary Emergency Contact: [name],Steffek  [place] Phone: [phone] Mobile Phone: [phone] Relation: Daughter  Code Status:  Full Code Goals of care: Advanced Directive information Advanced Directives 03/29/2016  Does Patient Have a Medical Advance Directive? Yes  Type of Paramedic of [place];Living will  Does patient want to make changes to medical advance directive? No - Patient declined  Copy of [place] in Chart? Yes  Would patient like information on creating a medical advance directive? -  Pre-existing out of facility DNR order (yellow form or pink MOST form) -     Chief Complaint  Patient presents with  . Acute Visit    Low B/P    HPI:  Pt is a 83 y.o. male seen today for an acute visit for    Past Medical History:  Diagnosis Date  . Arthritis    "minor, back and sometimes knees" (12/15/2012)  . Bradycardia    AFib/SSS s/p St Jude PPM 04/12/2008  . CAD (coronary artery disease) 12/30/2014   CABG (LIMA-LAD, SVG-RCA, SVG-OM in 1996).  07/2009 BMS to SVG-RCA. Cath in 04/2010 with  patent stents   . Cardiomyopathy, ischemic 08/25/2012  . CHF (congestive heart failure) ([place])   . Chronic knee pain 12/03/2014  . Combined congestive systolic and diastolic heart failure ([place]) 02/02/2015   Hx EF 41%. BNP 96.8 02/21/15 Torsemide 04/06/15 Na 142, K 4.6, Bun 16, creat 0.89 04/20/14 BNP 111.7, Na 142, K 4.6, Bun 16, creat 0.9   . Depression with anxiety 02/02/2015   02/21/15 Hgb A1c 5.8 03/10/15 MMSE 30/30   . Dizziness, after diuretic asscoiated with hypotension and responded to fluid bolus 06/05/2011   04/28/15 US carotid R+L normal bilateral arterial velocities.    Marland Kitchen Dyspnea 08/11/2014   Followed in Pulmonary clinic/ [facility]/ [facility]  - 08/11/2014  Walked RA x 1 laps @ 185 ft each stopped due to fatigue/off balance/ slow pace/  no sob or desat  - PFT's  09/26/2014  FEV1 2.26 (85 % ) ratio 76  p no % improvement from saba with DLCO  67 % corrects to 93 % for alv volume      Since prev study 08/04/13 minimal change lung vol or dlco    . Embolic cerebral infarction ([place]) 12/06/2015  . Exertional shortness of breath    "sometimes walking" (12/15/2012)  . GERD (gastroesophageal reflux disease)   . Gout 02/09/2015  . Heart murmur    "just told I had one today" (12/15/2012)  . Hiatal hernia   . Hyperlipidemia   . Hypertension   . Hypothyroid   . Influenza A 03/10/2016  . Insomnia   . Melanoma of back ([place]) 1976  . Myocardial infarction 1996; 2011   "  both silent" (12/15/2012)  . Nonrheumatic aortic valve stenosis   . Orthostatic hypotension   . Osteoporosis, senile   . Pacemaker   . RBBB   . Restless leg 02/02/2015  . Right leg weakness 12/06/2015  . [facility] spotted fever   . S/P CABG x 4   . Sick sinus syndrome ([place]) 01/31/2014  . Sustained ventricular tachycardia ([place]) 07/27/2014  . Urinary retention 12/30/2014   Past Surgical History:  Procedure Laterality Date  . CARDIAC CATHETERIZATION  04/2010   LIMA to LAD patent,SVG to OM patent,no in-stnet restenosis RCA  .  CATARACT EXTRACTION W/ INTRAOCULAR LENS  IMPLANT, BILATERAL Bilateral 2012  . CORONARY ANGIOPLASTY WITH STENT PLACEMENT  07/2009   bare metal stent to SVG to the RCA  . CORONARY ARTERY BYPASS GRAFT  1996   LIMA to LAD,SVG to RCA & SVG to OM  . INSERT / REPLACE / REMOVE PACEMAKER  2010  . MELANOMA EXCISION  05/1974 X2   "taken off my back" (12/15/2012)  . NM MYOVIEW LTD  06/2011   low risk  . TONSILLECTOMY  1938  . TRANSURETHRAL RESECTION OF PROSTATE  1986  . US ECHOCARDIOGRAPHY  07/11/2009   EF 45-50%    Allergies  Allergen Reactions  . Altace [Ramipril] Cough  . Crestor [Rosuvastatin Calcium] Rash  . Penicillins Rash and Other (See Comments)    Has patient had a PCN reaction causing immediate rash, facial/tongue/throat swelling, SOB or lightheadedness with hypotension: No Has patient had a PCN reaction causing severe rash involving mucus membranes or skin necrosis: Yes Has patient had a PCN reaction that required hospitalization Yes Has patient had a PCN reaction occurring within the last 10 years: No If all of the above answers are "NO", then may proceed with Cephalosporin use.   . Sulfa Antibiotics Rash    Allergies as of 03/29/2016      Reactions   Altace [ramipril] Cough   Crestor [rosuvastatin Calcium] Rash   Penicillins Rash, Other (See Comments)   Has patient had a PCN reaction causing immediate rash, facial/tongue/throat swelling, SOB or lightheadedness with hypotension: No Has patient had a PCN reaction causing severe rash involving mucus membranes or skin necrosis: Yes Has patient had a PCN reaction that required hospitalization Yes Has patient had a PCN reaction occurring within the last 10 years: No If all of the above answers are "NO", then may proceed with Cephalosporin use.   Sulfa Antibiotics Rash      Medication List       Accurate as of 03/29/16  2:06 PM. Always use your most recent med list.          amiodarone 200 MG tablet Commonly known as:   PACERONE Take 400 mg by mouth daily.   aspirin EC 81 MG tablet Take 1 tablet (81 mg total) by mouth daily.   clobetasol 0.05 % external solution Commonly known as:  TEMOVATE Apply 1 application topically as needed (for itchy scalp).   clonazePAM 0.5 MG tablet Commonly known as:  KLONOPIN Take 0.5 mg by mouth 3 (three) times daily as needed for anxiety.   clopidogrel 75 MG tablet Commonly known as:  PLAVIX Take 1 tablet (75 mg total) by mouth daily.   levothyroxine 137 MCG tablet Commonly known as:  SYNTHROID, LEVOTHROID Take 1 tablet (137 mcg total) by mouth daily before breakfast.   lubiprostone 24 MCG capsule Commonly known as:  AMITIZA Take 24 mcg by mouth at bedtime.   midodrine 5 MG  tablet Commonly known as:  PROAMATINE Take 5 mg by mouth 3 (three) times daily with meals.   pantoprazole 40 MG tablet Commonly known as:  PROTONIX Take 40 mg by mouth at bedtime.   polyethylene glycol packet Commonly known as:  MIRALAX / GLYCOLAX Take 17 g by mouth daily as needed for mild constipation.   pravastatin 40 MG tablet Commonly known as:  PRAVACHOL Take 1 tablet by mouth daily.   rOPINIRole 0.5 MG tablet Commonly known as:  REQUIP Take 0.5 mg by mouth 3 (three) times daily as needed (for restless leg syndrome).   senna-docusate 8.6-50 MG tablet Commonly known as:  Senokot-S Take 4 tablets by mouth daily as needed for mild constipation.   tamsulosin 0.4 MG Caps capsule Commonly known as:  FLOMAX Take 0.4 mg by mouth at bedtime.   torsemide 20 MG tablet Commonly known as:  DEMADEX Take 20 mg by mouth daily as needed (for weight gain of 3 or more lbs).   traZODone 50 MG tablet Commonly known as:  DESYREL Take 50 mg by mouth at bedtime.   triamcinolone cream 0.1 % Commonly known as:  KENALOG Apply 1 application topically 2 (two) times daily as needed (for irritation).   Vitamin D3 5000 units Caps Take 5,000 Units by mouth every Monday.       Review of  Systems  Immunization History  Administered Date(s) Administered  . Influenza-Unspecified 12/05/2013, 11/05/2014, 11/16/2015  . PPD Test 03/07/2014  . Pneumococcal-Unspecified 10/05/2009   Pertinent  Health Maintenance Due  Topic Date Due  . PNA vac Low Risk Adult (2 of 2 - PCV13) 10/06/2010  . DEXA SCAN  12/14/2024  . INFLUENZA VACCINE  Completed   Fall Risk  10/30/2015 09/05/2015 02/02/2015  Falls in the past year? No No No  Risk for fall due to : Impaired balance/gait - -   Functional Status Survey:    Vitals:   03/29/16 1346  BP: 108/73  Pulse: 68  Resp: 16  Temp: 97.6 F (36.4 C)  SpO2: 98%  Weight: 174 lb 4.8 oz (79.1 kg)  Height: 5\' 11"  (1.803 m)   Body mass index is 24.31 kg/m. Physical Exam  Labs reviewed:  Recent Labs  01/11/16 0537 03/10/16 1443 03/11/16 0436  NA 140 140 139  K 4.1 3.5 3.7  CL 106 104 108  CO2 27 29 25   GLUCOSE 97 96 105*  BUN 13 22* 18  CREATININE 0.79 1.03 0.72  CALCIUM 8.8* 8.7* 8.0*    Recent Labs  01/10/16 1400 03/10/16 1443 03/11/16 0436  AST 25 53* 46*  ALT 22 26 23   ALKPHOS 68 61 51  BILITOT 0.4 0.6 0.4  PROT 6.4* 6.9 5.8*  ALBUMIN 3.5 3.7 3.1*    Recent Labs  12/06/15 1617 01/10/16 1400  01/11/16 0537 03/10/16 1443 03/11/16 0436  WBC 5.4 6.0  --  6.4 8.3 6.3  NEUTROABS 3.3 4.1  --   --  5.8  --   HGB 13.5 14.1  < > 13.9 13.1 12.0*  HCT 41.4 42.7  < > 42.2 39.5 36.9*  MCV 98.6 97.7  --  97.2 95.6 95.8  PLT 183 190  --  188 190 176  < > = values in this interval not displayed. Lab Results  Component Value Date   TSH 2.58 11/27/2015   Lab Results  Component Value Date   HGBA1C 5.6 12/07/2015   Lab Results  Component Value Date   CHOL 84 12/07/2015  HDL 41 12/07/2015   LDLCALC 23 12/07/2015   TRIG 102 12/07/2015   CHOLHDL 2.0 12/07/2015    Significant Diagnostic Results in last 30 days:  Dg Chest 2 View  Result Date: 03/10/2016 CLINICAL DATA:  Cough.  Influenza EXAM: CHEST  2 VIEW  COMPARISON:  01/11/2016 FINDINGS: Large hiatal hernia. Increased streaky opacity at the left base around the hernia. Chronic cardiopericardial enlargement. Dual-chamber pacer leads from the left. Status post CABG. No edema, effusion, or pneumothorax. IMPRESSION: 1. Increased atelectasis around the patient's large hiatal hernia. No definitive pneumonia. 2. Cardiomegaly without failure. Electronically Signed   By: Monte Fantasia M.D.   On: 03/10/2016 14:35    Assessment/Plan 1. Constipation, unspecified constipation type   2. Gastroesophageal reflux disease with esophagitis   3. Hypothyroidism, unspecified type   4. Urinary retention   5. Insomnia, unspecified type   6. Depression with anxiety   7. Orthostatic hypotension   8. RLS (restless legs syndrome)     Family/ staff Communication:   Labs/tests ordered:

## 2016-03-29 NOTE — Assessment & Plan Note (Signed)
Stable, continue Trazodone 50mg  hs, prn Clonazepam.

## 2016-04-01 ENCOUNTER — Non-Acute Institutional Stay (SKILLED_NURSING_FACILITY): Payer: Medicare Other | Admitting: Internal Medicine

## 2016-04-01 ENCOUNTER — Encounter: Payer: Self-pay | Admitting: Internal Medicine

## 2016-04-01 DIAGNOSIS — M19021 Primary osteoarthritis, right elbow: Secondary | ICD-10-CM

## 2016-04-01 NOTE — Progress Notes (Signed)
Progress Note   Location:  [place] Room Number: N35 Place of Service:  SNF [phone]) Provider:  Jeanmarie Hubert, MD  Patient Care Team: Estill Dooms, MD as PCP - General (Internal Medicine) [name], MD as Attending Physician (Cardiology) Vevelyn Royals, MD as Consulting Physician (Ophthalmology) Franchot Gallo, MD as Consulting Physician (Urology) Tanda Rockers, MD as Consulting Physician (Pulmonary Disease) Allyn Kenner, MD (Dermatology) Deliah Goody, PA-C as Physician Assistant (Physician Assistant) Man Otho Darner, NP as Nurse Practitioner (Internal Medicine)  Extended Emergency Contact Information Primary Emergency Contact: Falletta,[name] Address: [place], [place] Phone: [phone] Mobile Phone: [phone] Relation: Spouse Secondary Emergency Contact: [name],Mc[place]er  [place] Phone: [phone] Mobile Phone: [phone] Relation: Daughter  Code Status:  Full Code Goals of care: Advanced Directive information Advanced Directives 04/01/2016  Does Patient Have a Medical Advance Directive? Yes  Type of Paramedic of [place];Living will  Does patient want to make changes to medical advance directive? -  Copy of [place] in Chart? Yes  Would patient like information on creating a medical advance directive? -  Pre-existing out of facility DNR order (yellow form or pink MOST form) -     Chief Complaint  Patient presents with  . Acute Visit    swollen right elbow    HPI:  Pt is a 83 y.o. male seen today for an acute visit for swollen right elbow and the upper portion of the right forearm. Tender with movement. No known injury or fall. Mild erythema.   Past Medical History:  Diagnosis Date  . Arthritis    "minor, back and sometimes knees" (12/15/2012)  . Bradycardia    AFib/SSS s/p St Jude PPM 04/12/2008  . CAD (coronary  artery disease) 12/30/2014   CABG (LIMA-LAD, SVG-RCA, SVG-OM in 1996).  07/2009 BMS to SVG-RCA. Cath in 04/2010 with patent stents   . Cardiomyopathy, ischemic 08/25/2012  . CHF (congestive heart failure) ([place])   . Chronic knee pain 12/03/2014  . Combined congestive systolic and diastolic heart failure ([place]) 02/02/2015   Hx EF 41%. BNP 96.8 02/21/15 Torsemide 04/06/15 Na 142, K 4.6, Bun 16, creat 0.89 04/20/14 BNP 111.7, Na 142, K 4.6, Bun 16, creat 0.9   . Depression with anxiety 02/02/2015   02/21/15 Hgb A1c 5.8 03/10/15 MMSE 30/30   . Dizziness, after diuretic asscoiated with hypotension and responded to fluid bolus 06/05/2011   04/28/15 US carotid R+L normal bilateral arterial velocities.    Marland Kitchen Dyspnea 08/11/2014   Followed in Pulmonary clinic/ [facility]/ [place]  - 08/11/2014  Walked RA x 1 laps @ 185 ft each stopped due to fatigue/off balance/ slow pace/  no sob or desat  - PFT's  09/26/2014  FEV1 2.26 (85 % ) ratio 76  p no % improvement from saba with DLCO  67 % corrects to 93 % for alv volume      Since prev study 08/04/13 minimal change lung vol or dlco    . Embolic cerebral infarction ([place]) 12/06/2015  . Exertional shortness of breath    "sometimes walking" (12/15/2012)  . GERD (gastroesophageal reflux disease)   . Gout 02/09/2015  . Heart murmur    "just told I had one today" (12/15/2012)  . Hiatal hernia   . Hyperlipidemia   . Hypertension   . Hypothyroid   . Influenza  A 03/10/2016  . Insomnia   . Melanoma of back ([place]) 1976  . Myocardial infarction 1996; 2011   "both silent" (12/15/2012)  . Nonrheumatic aortic valve stenosis   . Orthostatic hypotension   . Osteoporosis, senile   . Pacemaker   . RBBB   . Restless leg 02/02/2015  . Right leg weakness 12/06/2015  . [facility] spotted fever   . S/P CABG x 4   . Sick sinus syndrome ([place]) 01/31/2014  . Sustained ventricular tachycardia ([place]) 07/27/2014  . Urinary retention 12/30/2014   Past Surgical History:  Procedure Laterality  Date  . CARDIAC CATHETERIZATION  04/2010   LIMA to LAD patent,SVG to OM patent,no in-stnet restenosis RCA  . CATARACT EXTRACTION W/ INTRAOCULAR LENS  IMPLANT, BILATERAL Bilateral 2012  . CORONARY ANGIOPLASTY WITH STENT PLACEMENT  07/2009   bare metal stent to SVG to the RCA  . CORONARY ARTERY BYPASS GRAFT  1996   LIMA to LAD,SVG to RCA & SVG to OM  . INSERT / REPLACE / REMOVE PACEMAKER  2010  . MELANOMA EXCISION  05/1974 X2   "taken off my back" (12/15/2012)  . NM MYOVIEW LTD  06/2011   low risk  . TONSILLECTOMY  1938  . TRANSURETHRAL RESECTION OF PROSTATE  1986  . US ECHOCARDIOGRAPHY  07/11/2009   EF 45-50%    Allergies  Allergen Reactions  . Altace [Ramipril] Cough  . Crestor [Rosuvastatin Calcium] Rash  . Penicillins Rash and Other (See Comments)    Has patient had a PCN reaction causing immediate rash, facial/tongue/throat swelling, SOB or lightheadedness with hypotension: No Has patient had a PCN reaction causing severe rash involving mucus membranes or skin necrosis: Yes Has patient had a PCN reaction that required hospitalization Yes Has patient had a PCN reaction occurring within the last 10 years: No If all of the above answers are "NO", then may proceed with Cephalosporin use.   . Sulfa Antibiotics Rash    Allergies as of 04/01/2016      Reactions   Altace [ramipril] Cough   Crestor [rosuvastatin Calcium] Rash   Penicillins Rash, Other (See Comments)   Has patient had a PCN reaction causing immediate rash, facial/tongue/throat swelling, SOB or lightheadedness with hypotension: No Has patient had a PCN reaction causing severe rash involving mucus membranes or skin necrosis: Yes Has patient had a PCN reaction that required hospitalization Yes Has patient had a PCN reaction occurring within the last 10 years: No If all of the above answers are "NO", then may proceed with Cephalosporin use.   Sulfa Antibiotics Rash      Medication List       Accurate as of 04/01/16   3:20 PM. Always use your most recent med list.          acetaminophen 325 MG tablet Commonly known as:  TYLENOL Take by mouth. Take 2 tablets every four hours for fever greater than 100.3 x 24 hours   amiodarone 200 MG tablet Commonly known as:  PACERONE Take 400 mg by mouth daily.   aspirin EC 81 MG tablet Take 1 tablet (81 mg total) by mouth daily.   clobetasol 0.05 % external solution Commonly known as:  TEMOVATE Apply 1 application topically as needed (for itchy scalp).   clonazePAM 0.5 MG tablet Commonly known as:  KLONOPIN Take 0.5 mg by mouth 3 (three) times daily as needed for anxiety.   clopidogrel 75 MG tablet Commonly known as:  PLAVIX Take 1 tablet (75 mg total)  by mouth daily.   levothyroxine 137 MCG tablet Commonly known as:  SYNTHROID, LEVOTHROID Take 1 tablet (137 mcg total) by mouth daily before breakfast.   loratadine 10 MG tablet Commonly known as:  CLARITIN Take 10 mg by mouth. Take one tablet daily as needed for runny nose for 48 hours   lubiprostone 24 MCG capsule Commonly known as:  AMITIZA Take 24 mcg by mouth at bedtime.   meloxicam 7.5 MG tablet Commonly known as:  MOBIC Take 7.5 mg by mouth. Take one tablet once a day   midodrine 5 MG tablet Commonly known as:  PROAMATINE Take 5 mg by mouth 3 (three) times daily with meals.   pantoprazole 40 MG tablet Commonly known as:  PROTONIX Take 40 mg by mouth at bedtime.   polyethylene glycol packet Commonly known as:  MIRALAX / GLYCOLAX Take 17 g by mouth daily as needed for mild constipation.   pravastatin 40 MG tablet Commonly known as:  PRAVACHOL Take 1 tablet by mouth daily.   rOPINIRole 0.5 MG tablet Commonly known as:  REQUIP Take 0.5 mg by mouth 3 (three) times daily as needed (for restless leg syndrome).   senna-docusate 8.6-50 MG tablet Commonly known as:  Senokot-S Take 4 tablets by mouth daily as needed for mild constipation.   tamsulosin 0.4 MG Caps capsule Commonly  known as:  FLOMAX Take 0.4 mg by mouth at bedtime.   torsemide 20 MG tablet Commonly known as:  DEMADEX Take 20 mg by mouth daily as needed (for weight gain of 3 or more lbs).   traZODone 50 MG tablet Commonly known as:  DESYREL Take 50 mg by mouth at bedtime.   triamcinolone cream 0.1 % Commonly known as:  KENALOG Apply 1 application topically 2 (two) times daily as needed (for irritation).   Vitamin D3 5000 units Caps Take 5,000 Units by mouth every Monday.       Review of Systems  Constitutional: Positive for appetite change and fatigue. Negative for activity change, fever and unexpected weight change.  HENT: Positive for hearing loss and tinnitus. Negative for congestion, ear pain, rhinorrhea, sore throat, trouble swallowing and voice change.   Eyes: Negative for photophobia and pain.       Corrective lenses  Respiratory: Positive for shortness of breath. Negative for cough, choking, chest tightness and wheezing.   Cardiovascular: Positive for leg swelling (2+ and usingcompression hosiery). Negative for chest pain and palpitations.       Congestive cardiomyopathy. Coronary artery disease, status post CABG. Cardiac arrhythmias, status post pacemaker implanted left upper chest.  Gastrointestinal: Positive for constipation. Negative for abdominal distention, abdominal pain, diarrhea, nausea and vomiting.  Endocrine: Negative for cold intolerance, heat intolerance, polydipsia, polyphagia and polyuria.  Genitourinary: Positive for enuresis and frequency. Negative for dysuria, hematuria, testicular pain and urgency.       Double voids. Urgency. Frequency. Nocturia x 7.  Musculoskeletal: Positive for gait problem (using rolling walker). Negative for arthralgias, back pain, myalgias and neck pain.       Ambulates with walker. Pain in the right elbow and upper right forearm  Skin: Positive for rash (both arms. Seborrhea capitis.) and wound (improvement in left 2nd toe medial side  ulcereation and swelling and erythema of the entire toe.). Negative for color change and pallor.       Chronic medial right buttock pressure ulcer. R elbow warm, pain, swelling, Hx of gout, no reduced ROM, X-ray of the right humerus and elbow showed no acute  fx or dislocation.    Allergic/Immunologic: Negative.   Neurological: Positive for dizziness, speech difficulty (sometimes has difficulty with word finding) and weakness. Negative for tremors, syncope, numbness and headaches.       MRI brain disclosed cerebral atrophy and microvascular disease.  Insomnia and restless legs are improved. Patient reports that he has H/O dizziness. This makes the patient feel unbalanced. R hand numbness.  Cognitive decline since 2014. Restless legs day and night.  Hematological: Negative for adenopathy. Does not bruise/bleed easily.  Psychiatric/Behavioral: Positive for sleep disturbance (nocturia x 7). Negative for behavioral problems, confusion, decreased concentration, hallucinations and suicidal ideas. The patient is not nervous/anxious.        Not enough sleep, decreased energy     Immunization History  Administered Date(s) Administered  . Influenza-Unspecified 12/05/2013, 11/05/2014, 11/16/2015  . PPD Test 03/07/2014  . Pneumococcal-Unspecified 10/05/2009   Pertinent  Health Maintenance Due  Topic Date Due  . PNA vac Low Risk Adult (2 of 2 - PCV13) 10/06/2010  . DEXA SCAN  12/14/2024  . INFLUENZA VACCINE  Completed   Fall Risk  10/30/2015 09/05/2015 02/02/2015  Falls in the past year? No No No  Risk for fall due to : Impaired balance/gait - -   Functional Status Survey:    Vitals:   04/01/16 1508  BP: 128/68  Pulse: 69  Resp: 18  Temp: 97.2 F (36.2 C)  SpO2: 97%  Weight: 174 lb (78.9 kg)  Height: 5\' 11"  (1.803 m)   Body mass index is 24.27 kg/m. Physical Exam  Constitutional: He is oriented to person, place, and time. He appears well-developed and well-nourished. No distress.    Frail.  HENT:  Head: Normocephalic and atraumatic.  Right Ear: External ear normal.  Left Ear: External ear normal.  Nose: Nose normal.  Mouth/Throat: Oropharynx is clear and moist. No oropharyngeal exudate.  Eyes: Conjunctivae and EOM are normal. Pupils are equal, round, and reactive to light. Right eye exhibits no discharge. Left eye exhibits no discharge.  Neck: Normal range of motion. Neck supple. No JVD present. No tracheal deviation present. No thyromegaly present.  Cardiovascular: Normal rate, regular rhythm and intact distal pulses.  Exam reveals no gallop and no friction rub.   Murmur heard. [id number] systolic  Pulmonary/Chest: Effort normal and breath sounds normal. No respiratory distress. He has no wheezes. He has no rales. He exhibits no tenderness.  Abdominal: He exhibits no distension and no mass. There is no tenderness.  abd hernia  Genitourinary: No penile tenderness.  Musculoskeletal: Normal range of motion. He exhibits edema (trace to 1+ bipedal). He exhibits no tenderness.  Gait disturbance and loss of balance.  Swollen right elbow and upper forearm. Mild erythema and tenderness with movement.  Lymphadenopathy:    He has no cervical adenopathy.  Neurological: He is alert and oriented to person, place, and time. He has normal reflexes. No cranial nerve deficit. He exhibits normal muscle tone. Coordination normal.  Skin: Skin is warm and dry. No rash noted. No erythema. No pallor.  Ecchymoses of both arms. Improvement of the red bumps that are pruritic on both arms. Seborrhea of the scalp. Improvement in the cellulitis of the left 2nd toe. Ulcer medially of the left 2nd toe.  Psychiatric: He has a normal mood and affect. His behavior is normal. Judgment and thought content normal.    Labs reviewed:  Recent Labs  01/11/16 0537 03/10/16 1443 03/11/16 0436  NA 140 140 139  K 4.1 3.5 3.7  CL 106 104 108  CO2 27 29 25   GLUCOSE 97 96 105*  BUN 13 22* 18  CREATININE  0.79 1.03 0.72  CALCIUM 8.8* 8.7* 8.0*    Recent Labs  01/10/16 1400 03/10/16 1443 03/11/16 0436  AST 25 53* 46*  ALT 22 26 23   ALKPHOS 68 61 51  BILITOT 0.4 0.6 0.4  PROT 6.4* 6.9 5.8*  ALBUMIN 3.5 3.7 3.1*    Recent Labs  12/06/15 1617 01/10/16 1400  01/11/16 0537 03/10/16 1443 03/11/16 0436  WBC 5.4 6.0  --  6.4 8.3 6.3  NEUTROABS 3.3 4.1  --   --  5.8  --   HGB 13.5 14.1  < > 13.9 13.1 12.0*  HCT 41.4 42.7  < > 42.2 39.5 36.9*  MCV 98.6 97.7  --  97.2 95.6 95.8  PLT 183 190  --  188 190 176  < > = values in this interval not displayed. Lab Results  Component Value Date   TSH 2.58 11/27/2015   Lab Results  Component Value Date   HGBA1C 5.6 12/07/2015   Lab Results  Component Value Date   CHOL 84 12/07/2015   HDL 41 12/07/2015   LDLCALC 23 12/07/2015   TRIG 102 12/07/2015   CHOLHDL 2.0 12/07/2015    Significant Diagnostic Results in last 30 days:  Dg Chest 2 View  Result Date: 03/10/2016 CLINICAL DATA:  Cough.  Influenza EXAM: CHEST  2 VIEW COMPARISON:  01/11/2016 FINDINGS: Large hiatal hernia. Increased streaky opacity at the left base around the hernia. Chronic cardiopericardial enlargement. Dual-chamber pacer leads from the left. Status post CABG. No edema, effusion, or pneumothorax. IMPRESSION: 1. Increased atelectasis around the patient's large hiatal hernia. No definitive pneumonia. 2. Cardiomegaly without failure. Electronically Signed   By: Monte Fantasia M.D.   On: 03/10/2016 14:35    Assessment/Plan 1. Inflammation of right elbow I am not sure if there is a fracture. Portable xray ordered.if n fracture, then antibiotic should be ordered for possible cellulitis.

## 2016-04-02 NOTE — Assessment & Plan Note (Addendum)
04/01/16 X-ray R humerus and elbow: no definite acute fx or dislocation. Chronic rotator cuff tear R elbow warm, pain, swelling, Hx of gout, no reduced ROM, X-ray of the right humerus and elbow showed no acute fx or dislocation.  Gout vs cellulitis vs DVT, update uric acid, ESR, RAF, CBC, CMP, venous US RUE Medrol dose pk, continue Mobic, Doxycycline 139m bid x 7 days

## 2016-04-03 LAB — CBC AND DIFFERENTIAL
HCT: 40 % — AB (ref 41–53)
Hemoglobin: 13.5 g/dL (ref 13.5–17.5)
Platelets: 225 10*3/uL (ref 150–399)
WBC: 5.6 10^3/mL

## 2016-04-03 LAB — HEPATIC FUNCTION PANEL
ALT: 14 U/L (ref 10–40)
AST: 15 U/L (ref 14–40)
Alkaline Phosphatase: 60 U/L (ref 25–125)
Bilirubin, Total: 0.4 mg/dL

## 2016-04-03 LAB — BASIC METABOLIC PANEL
BUN: 15 mg/dL (ref 4–21)
Creatinine: 0.7 mg/dL (ref <=1.3)
Glucose: 84 mg/dL
Potassium: 4.6 mmol/L (ref 3.4–5.3)
Sodium: 138 mmol/L (ref 137–147)

## 2016-04-04 ENCOUNTER — Telehealth: Payer: Self-pay | Admitting: Cardiovascular Disease

## 2016-04-04 NOTE — Telephone Encounter (Signed)
Ricard Pendergraph (pt daughter) is calling back and states that she would like some advice on whether or not the patient needs to go to the hospital. Please call, Ms. Ozbirn is worried. Thanks.

## 2016-04-04 NOTE — Telephone Encounter (Signed)
Will forward to Dr Croitoru for review  

## 2016-04-04 NOTE — Telephone Encounter (Signed)
Dr Sallyanne Kuster reviewed medication as requested  Confirmed Midodrine 5 mg three times a day with Hoyle Sauer Per Dr Sallyanne Kuster increase Midodrine to 10 mg at 8 am, 5 mg at 12 pm, and 5 mg at 4:00 pm.  Try compression stockings Recommendations reviewed with patient, daughter, and Hoyle Sauer at [facility] to go to ED dizzy or feeling like he is going to black out. Per patient he is feeling better than he did earlier

## 2016-04-04 NOTE — Telephone Encounter (Signed)
New Message  Pt c/o BP issue: STAT if pt c/o blurred vision, one-sided weakness or slurred speech  1. What are your last 5 BP readings? In bed laying down 137/72...Marland KitchenMarland KitchenMarland Kitchen sitting up in bed 96/60.... Standing up 68/45  2. Are you having any other symptoms (ex. Dizziness, headache, blurred vision, passed out)? Very weak  3. What is your BP issue? Hoyle Sauer from [facility] would like to speak with RN. She states she would like pt medication list reviewed by Dr. Sallyanne Kuster. Hoyle Sauer states she is faxing over pts medication list. please call back to discuss

## 2016-04-05 ENCOUNTER — Encounter: Payer: Self-pay | Admitting: Nurse Practitioner

## 2016-04-05 ENCOUNTER — Non-Acute Institutional Stay (SKILLED_NURSING_FACILITY): Payer: Medicare Other | Admitting: Nurse Practitioner

## 2016-04-05 DIAGNOSIS — K2100 Gastro-esophageal reflux disease with esophagitis, without bleeding: Secondary | ICD-10-CM

## 2016-04-05 DIAGNOSIS — L03113 Cellulitis of right upper limb: Secondary | ICD-10-CM | POA: Diagnosis not present

## 2016-04-05 DIAGNOSIS — M1000 Idiopathic gout, unspecified site: Secondary | ICD-10-CM

## 2016-04-05 DIAGNOSIS — K5900 Constipation, unspecified: Secondary | ICD-10-CM

## 2016-04-05 DIAGNOSIS — I951 Orthostatic hypotension: Secondary | ICD-10-CM | POA: Diagnosis not present

## 2016-04-05 DIAGNOSIS — N179 Acute kidney failure, unspecified: Secondary | ICD-10-CM

## 2016-04-05 DIAGNOSIS — I5022 Chronic systolic (congestive) heart failure: Secondary | ICD-10-CM | POA: Diagnosis not present

## 2016-04-05 DIAGNOSIS — I10 Essential (primary) hypertension: Secondary | ICD-10-CM

## 2016-04-05 DIAGNOSIS — I5042 Chronic combined systolic (congestive) and diastolic (congestive) heart failure: Secondary | ICD-10-CM | POA: Diagnosis not present

## 2016-04-05 DIAGNOSIS — E039 Hypothyroidism, unspecified: Secondary | ICD-10-CM

## 2016-04-05 DIAGNOSIS — F418 Other specified anxiety disorders: Secondary | ICD-10-CM

## 2016-04-05 DIAGNOSIS — G4700 Insomnia, unspecified: Secondary | ICD-10-CM

## 2016-04-05 DIAGNOSIS — R350 Frequency of micturition: Secondary | ICD-10-CM

## 2016-04-05 DIAGNOSIS — G2581 Restless legs syndrome: Secondary | ICD-10-CM

## 2016-04-05 DIAGNOSIS — L03032 Cellulitis of left toe: Secondary | ICD-10-CM

## 2016-04-05 NOTE — Assessment & Plan Note (Signed)
04/04/16 venous US RUE no DVT, Na 138, K 4.6, Bun 15, creat 0.71, wbc 5.6, Hgb 13.5, plt 225, uric acid 2.7

## 2016-04-05 NOTE — Assessment & Plan Note (Signed)
Stable, continue Trazodone 50mg  hs, prn Clonazepam.

## 2016-04-05 NOTE — Assessment & Plan Note (Signed)
Managed with daily Amitiza 24mcg qhs, prn MiraLax, prn Senna S 03/29/16 KUB no bowel obstruction,   

## 2016-04-05 NOTE — Progress Notes (Signed)
Location:  [place] Room Number: 35 Place of Service:  SNF (31) Provider:  Keonta Monceaux, Manxie NP  Jeanmarie Hubert, MD  Patient Care Team: Estill Dooms, MD as PCP - General (Internal Medicine) [name], MD as Attending Physician (Cardiology) Vevelyn Royals, MD as Consulting Physician (Ophthalmology) Franchot Gallo, MD as Consulting Physician (Urology) Tanda Rockers, MD as Consulting Physician (Pulmonary Disease) Allyn Kenner, MD (Dermatology) Deliah Goody, PA-C as Physician Assistant (Physician Assistant) Treysen Sudbeck Otho Darner, NP as Nurse Practitioner (Internal Medicine)  Extended Emergency Contact Information Primary Emergency Contact: More,[name] Address: [place], [place] Phone: [phone] Mobile Phone: [phone] Relation: Spouse Secondary Emergency Contact: [name],Neubauer  [place] Phone: [phone] Mobile Phone: [phone] Relation: Daughter  Code Status:  Full Code Goals of care: Advanced Directive information Advanced Directives 04/05/2016  Does Patient Have a Medical Advance Directive? Yes  Type of Paramedic of [place];Living will  Does patient want to make changes to medical advance directive? No - Patient declined  Copy of [place] in Chart? Yes  Would patient like information on creating a medical advance directive? -  Pre-existing out of facility DNR order (yellow form or pink MOST form) -     Chief Complaint  Patient presents with  . Acute Visit    ? Cellulitis    HPI:  Pt is a 83 y.o. male seen today for an acute visit for right elbow cellulitis.                           Hx of VT, taking Amiodarone 442m daily, CHF managed with prn Torsemide 296m taking Levothyroxine 13737m last TSH 2.58 11/27/15, BPH, managed with Tamsulosin 0.4mg62mily, sleeps with aid of Trazodone 50mg43mnight. His mood is stable, prn  Clonazepam available to him. RLS prn Requip and Clonazepam available to him. Orthostatic hypotension is managed with Midodrine 10mg 35m5mg bi54mer cardiology.   Past Medical History:  Diagnosis Date  . Arthritis    "minor, back and sometimes knees" (12/15/2012)  . Bradycardia    AFib/SSS s/p St Jude PPM 04/12/2008  . CAD (coronary artery disease) 12/30/2014   CABG (LIMA-LAD, SVG-RCA, SVG-OM in 1996).  07/2009 BMS to SVG-RCA. Cath in 04/2010 with patent stents   . Cardiomyopathy, ischemic 08/25/2012  . CHF (congestive heart failure) (HCC)   [place] 12/03/2014  . Combined congestive systolic and diastolic heart failure (HCC) 12[place]/2016   Hx EF 41%. BNP 96.8 02/21/15 Torsemide 04/06/15 Na 142, K 4.6, Bun 16, creat 0.89 04/20/14 BNP 111.7, Na 142, K 4.6, Bun 16, creat 0.9   . Depression with anxiety 02/02/2015   02/21/15 Hgb A1c 5.8 03/10/15 MMSE 30/30   . Dizziness, after diuretic asscoiated with hypotension and responded to fluid bolus 06/05/2011   04/28/15 US caro[place]oread R+L normal bilateral arterial velocities.    . DyspnMarland Kitchena 08/11/2014   Followed in Pulmonary clinic/ [facility]/ [place]  - 08/11/2014  Walked RA x 1 laps @ 185 ft each stopped due to fatigue/off balance/ slow pace/  no sob or desat  - PFT's  09/26/2014  FEV1 2.26 (85 % ) ratio 76  p no % improvement from saba with DLCO  67 % corrects to 93 % for alv volume      Since prev study 08/04/13 minimal  change lung vol or dlco    . Embolic cerebral infarction ([facility]) 12/06/2015  . Exertional shortness of breath    "sometimes walking" (12/15/2012)  . GERD (gastroesophageal reflux disease)   . Gout 02/09/2015  . Heart murmur    "just told I had one today" (12/15/2012)  . Hiatal hernia   . Hyperlipidemia   . Hypertension   . Hypothyroid   . Influenza A 03/10/2016  . Insomnia   . Melanoma of back ([place]) 1976  . Myocardial infarction 1996; 2011   "both silent" (12/15/2012)  . Nonrheumatic aortic valve stenosis   . Orthostatic hypotension     . Osteoporosis, senile   . Pacemaker   . RBBB   . Restless leg 02/02/2015  . Right leg weakness 12/06/2015  . [facility] spotted fever   . S/P CABG x 4   . Sick sinus syndrome ([place]) 01/31/2014  . Sustained ventricular tachycardia ([place]) 07/27/2014  . Urinary retention 12/30/2014   Past Surgical History:  Procedure Laterality Date  . CARDIAC CATHETERIZATION  04/2010   LIMA to LAD patent,SVG to OM patent,no in-stnet restenosis RCA  . CATARACT EXTRACTION W/ INTRAOCULAR LENS  IMPLANT, BILATERAL Bilateral 2012  . CORONARY ANGIOPLASTY WITH STENT PLACEMENT  07/2009   bare metal stent to SVG to the RCA  . CORONARY ARTERY BYPASS GRAFT  1996   LIMA to LAD,SVG to RCA & SVG to OM  . INSERT / REPLACE / REMOVE PACEMAKER  2010  . MELANOMA EXCISION  05/1974 X2   "taken off my back" (12/15/2012)  . NM MYOVIEW LTD  06/2011   low risk  . TONSILLECTOMY  1938  . TRANSURETHRAL RESECTION OF PROSTATE  1986  . US ECHOCARDIOGRAPHY  07/11/2009   EF 45-50%    Allergies  Allergen Reactions  . Altace [Ramipril] Cough  . Crestor [Rosuvastatin Calcium] Rash  . Penicillins Rash and Other (See Comments)    Has patient had a PCN reaction causing immediate rash, facial/tongue/throat swelling, SOB or lightheadedness with hypotension: No Has patient had a PCN reaction causing severe rash involving mucus membranes or skin necrosis: Yes Has patient had a PCN reaction that required hospitalization Yes Has patient had a PCN reaction occurring within the last 10 years: No If all of the above answers are "NO", then may proceed with Cephalosporin use.   . Sulfa Antibiotics Rash    Allergies as of 04/05/2016      Reactions   Altace [ramipril] Cough   Crestor [rosuvastatin Calcium] Rash   Penicillins Rash, Other (See Comments)   Has patient had a PCN reaction causing immediate rash, facial/tongue/throat swelling, SOB or lightheadedness with hypotension: No Has patient had a PCN reaction causing severe rash involving  mucus membranes or skin necrosis: Yes Has patient had a PCN reaction that required hospitalization Yes Has patient had a PCN reaction occurring within the last 10 years: No If all of the above answers are "NO", then may proceed with Cephalosporin use.   Sulfa Antibiotics Rash      Medication List       Accurate as of 04/05/16  5:50 PM. Always use your most recent med list.          acetaminophen 325 MG tablet Commonly known as:  TYLENOL Take by mouth. Take 2 tablets every four hours for fever greater than 100.3 x 24 hours   amiodarone 200 MG tablet Commonly known as:  PACERONE Take 400 mg by mouth daily.   aspirin EC 81 MG  tablet Take 1 tablet (81 mg total) by mouth daily.   clonazePAM 0.5 MG tablet Commonly known as:  KLONOPIN Take 0.5 mg by mouth 3 (three) times daily as needed for anxiety.   clopidogrel 75 MG tablet Commonly known as:  PLAVIX Take 1 tablet (75 mg total) by mouth daily.   doxycycline 100 MG EC tablet Commonly known as:  DORYX Take 100 mg by mouth 2 (two) times daily.   levothyroxine 137 MCG tablet Commonly known as:  SYNTHROID, LEVOTHROID Take 1 tablet (137 mcg total) by mouth daily before breakfast.   loratadine 10 MG tablet Commonly known as:  CLARITIN Take 10 mg by mouth. Take one tablet daily as needed for runny nose for 48 hours   lubiprostone 24 MCG capsule Commonly known as:  AMITIZA Take 24 mcg by mouth at bedtime.   pantoprazole 40 MG tablet Commonly known as:  PROTONIX Take 40 mg by mouth at bedtime.   polyethylene glycol packet Commonly known as:  MIRALAX / GLYCOLAX Take 17 g by mouth daily as needed for mild constipation.   pravastatin 40 MG tablet Commonly known as:  PRAVACHOL Take 1 tablet by mouth daily.   ROBAFEN DM 10-100 MG/5ML liquid Generic drug:  Dextromethorphan-Guaifenesin Take 5 mLs by mouth every 6 (six) hours.   rOPINIRole 0.5 MG tablet Commonly known as:  REQUIP Take 0.5 mg by mouth 3 (three) times daily  as needed (for restless leg syndrome).   saccharomyces boulardii 250 MG capsule Commonly known as:  FLORASTOR Take 250 mg by mouth 2 (two) times daily.   senna-docusate 8.6-50 MG tablet Commonly known as:  Senokot-S Take 4 tablets by mouth daily as needed for mild constipation.   tamsulosin 0.4 MG Caps capsule Commonly known as:  FLOMAX Take 0.4 mg by mouth at bedtime.   torsemide 20 MG tablet Commonly known as:  DEMADEX Take 20 mg by mouth daily as needed (for weight gain of 3 or more lbs).   traZODone 50 MG tablet Commonly known as:  DESYREL Take 50 mg by mouth at bedtime.   triamcinolone cream 0.1 % Commonly known as:  KENALOG Apply 1 application topically 2 (two) times daily as needed (for irritation).   Vitamin D3 5000 units Caps Take 5,000 Units by mouth every Monday.       Review of Systems  Constitutional: Positive for appetite change and fatigue. Negative for activity change, fever and unexpected weight change.  HENT: Positive for hearing loss and tinnitus. Negative for congestion, ear pain, rhinorrhea, sore throat, trouble swallowing and voice change.   Eyes: Negative for photophobia and pain.       Corrective lenses  Respiratory: Positive for shortness of breath. Negative for cough, choking, chest tightness and wheezing.   Cardiovascular: Positive for leg swelling (2+ and usingcompression hosiery). Negative for chest pain and palpitations.       Congestive cardiomyopathy. Coronary artery disease, status post CABG. Cardiac arrhythmias, status post pacemaker implanted left upper chest.  Gastrointestinal: Positive for constipation. Negative for abdominal distention, abdominal pain, diarrhea, nausea and vomiting.  Endocrine: Negative for cold intolerance, heat intolerance, polydipsia, polyphagia and polyuria.  Genitourinary: Positive for enuresis and frequency. Negative for dysuria, hematuria, testicular pain and urgency.       Double voids. Urgency. Frequency.  Nocturia x 7.  Musculoskeletal: Positive for gait problem (using rolling walker). Negative for arthralgias, back pain, myalgias and neck pain.       Ambulates with walker.   Skin: Positive for wound (  improvement in left 2nd toe medial side ulcereation and swelling and erythema of the entire toe.). Negative for color change, pallor and rash (both arms. Seborrhea capitis.).       Chronic medial right buttock pressure ulcer. R elbow warm, pain, swelling, Hx of gout, no reduced ROM, X-ray of the right humerus and elbow showed no acute fx or dislocation. Near resolution.    Allergic/Immunologic: Negative.   Neurological: Positive for dizziness, speech difficulty (sometimes has difficulty with word finding) and weakness. Negative for tremors, syncope, numbness and headaches.       MRI brain disclosed cerebral atrophy and microvascular disease.  Insomnia and restless legs are improved. Patient reports that he has H/O dizziness. This makes the patient feel unbalanced. R hand numbness.  Cognitive decline since 2014. Restless legs day and night.  Hematological: Negative for adenopathy. Does not bruise/bleed easily.  Psychiatric/Behavioral: Positive for sleep disturbance (nocturia x 7). Negative for behavioral problems, confusion, decreased concentration, hallucinations and suicidal ideas. The patient is not nervous/anxious.        Not enough sleep, decreased energy     Immunization History  Administered Date(s) Administered  . Influenza-Unspecified 12/05/2013, 11/05/2014, 11/16/2015  . PPD Test 03/07/2014  . Pneumococcal-Unspecified 10/05/2009   Pertinent  Health Maintenance Due  Topic Date Due  . PNA vac Low Risk Adult (2 of 2 - PCV13) 10/06/2010  . DEXA SCAN  12/14/2024  . INFLUENZA VACCINE  Completed   Fall Risk  10/30/2015 09/05/2015 02/02/2015  Falls in the past year? No No No  Risk for fall due to : Impaired balance/gait - -   Functional Status Survey:    Vitals:   04/05/16 1014  BP:  99/60  Pulse: 70  Resp: 20  Temp: 97.8 F (36.6 C)  SpO2: 93%  Weight: 174 lb 4.8 oz (79.1 kg)  Height: _0  (1.803 m)   Body mass index is 24.31 kg/m. Physical Exam  Constitutional: He is oriented to person, place, and time. He appears well-developed and well-nourished. No distress.  Frail.  HENT:  Head: Normocephalic and atraumatic.  Right Ear: External ear normal.  Left Ear: External ear normal.  Nose: Nose normal.  Mouth/Throat: Oropharynx is clear and moist. No oropharyngeal exudate.  Eyes: Conjunctivae and EOM are normal. Pupils are equal, round, and reactive to light. Right eye exhibits no discharge. Left eye exhibits no discharge.  Neck: Normal range of motion. Neck supple. No JVD present. No tracheal deviation present. No thyromegaly present.  Cardiovascular: Normal rate, regular rhythm and intact distal pulses.  Exam reveals no gallop and no friction rub.   Murmur heard. 9-3/2 systolic  Pulmonary/Chest: Effort normal and breath sounds normal. No respiratory distress. He has no wheezes. He has no rales. He exhibits no tenderness.  Abdominal: He exhibits no distension and no mass. There is no tenderness.  abd hernia  Genitourinary: No penile tenderness.  Musculoskeletal: Normal range of motion. He exhibits edema (trace to 1+ bipedal). He exhibits no tenderness.  Gait disturbance and loss of balance. Patient arrives in a  wheelchair today.  Lymphadenopathy:    He has no cervical adenopathy.  Neurological: He is alert and oriented to person, place, and time. He has normal reflexes. No cranial nerve deficit. He exhibits normal muscle tone. Coordination normal.  Skin: Skin is warm and dry. No rash noted. No erythema. No pallor.  Seborrhea of the scalp. Resolved cellulitis of the left 2nd toe. Ulcer medially of the left 2nd toe. R elbow warm, pain,  swelling, Hx of gout, no reduced ROM, X-ray of the right humerus and elbow showed no acute fx or dislocation. Near resolution.      Psychiatric: He has a normal mood and affect. His behavior is normal. Judgment and thought content normal.    Labs reviewed:  Recent Labs  01/11/16 0537 03/10/16 1443 03/11/16 0436 04/03/16  NA 140 140 139 138  K 4.1 3.5 3.7 4.6  CL 106 104 108  --   CO2 _0 --   GLUCOSE 97 96 105*  --   BUN 13 22* 18 15  CREATININE 0.79 1.03 0.72 0.7  CALCIUM 8.8* 8.7* 8.0*  --     Recent Labs  01/10/16 1400 03/10/16 1443 03/11/16 0436 04/03/16  AST 25 53* 46* 15  ALT _1 ALKPHOS 68 61 51 60  BILITOT 0.4 0.6 0.4  --   PROT 6.4* 6.9 5.8*  --   ALBUMIN 3.5 3.7 3.1*  --     Recent Labs  12/06/15 1617 01/10/16 1400  01/11/16 0537 03/10/16 1443 03/11/16 0436 04/03/16  WBC 5.4 6.0  --  6.4 8.3 6.3 5.6  NEUTROABS 3.3 4.1  --   --  5.8  --   --   HGB 13.5 14.1  < > 13.9 13.1 12.0* 13.5  HCT 41.4 42.7  < > 42.2 39.5 36.9* 40*  MCV 98.6 97.7  --  97.2 95.6 95.8  --   PLT 183 190  --  188 190 176 225  < > = values in this interval not displayed. Lab Results  Component Value Date   TSH 2.58 11/27/2015   Lab Results  Component Value Date   HGBA1C 5.6 12/07/2015   Lab Results  Component Value Date   CHOL 84 12/07/2015   HDL 41 12/07/2015   LDLCALC 23 12/07/2015   TRIG 102 12/07/2015   CHOLHDL 2.0 12/07/2015    Significant Diagnostic Results in last 30 days:  Dg Chest 2 View  Result Date: 03/10/2016 CLINICAL DATA:  Cough.  Influenza EXAM: CHEST  2 VIEW COMPARISON:  01/11/2016 FINDINGS: Large hiatal hernia. Increased streaky opacity at the left base around the hernia. Chronic cardiopericardial enlargement. Dual-chamber pacer leads from the left. Status post CABG. No edema, effusion, or pneumothorax. IMPRESSION: 1. Increased atelectasis around the patient's large hiatal hernia. No definitive pneumonia. 2. Cardiomegaly without failure. Electronically Signed   By: Monte Fantasia M.D.   On: 03/10/2016 14:35    Assessment/Plan Cellulitis of right arm 04/01/16  X-ray R humerus and elbow: no definite acute fx or dislocation. Chronic rotator cuff tear R elbow warm, pain, swelling, Hx of gout, no reduced ROM, X-ray of the right humerus and elbow showed no acute fx or dislocation.  Gout vs cellulitis vs DVT, update uric acid, ESR, RAF, CBC, CMP, venous US RUE Medrol dose pk, continue Mobic, Doxycycline 163m bid x 7 days 04/05/16 improved, 04/04/16 venous [place] no DVT, Na 138, K 4.6, Bun 15, creat 0.71, wbc 5.6, Hgb 13.5, plt 225, uric acid 2.7  Gout 04/04/16 venous [place]UE no DVT, Na 138, K 4.6, Bun 15, creat 0.71, wbc 5.6, Hgb 13.5, plt 225, uric acid 2.7   Orthostatic hypotension 04/04/16 Dr CSallyanne KusterMidodrine 1456m8am, 56m556m2N, 56mg63mm  Hypertension Controlled, prn Torsemide available for edema and HTN  Combined congestive systolic and diastolic heart failure (HCC)[place] clinically, chronic BLE edema, continue Torsemide prn, BNP 96.8 02/21/15. 04/06/15 Na 142, K 4.6,  Bun 16, creat 0.89, 04/20/14 BNP 111.7, Na 142, K 4.6, Bun 16, creat 0.9    GERD (gastroesophageal reflux disease) Stable, continue Protonix daily.   Constipation Managed with daily Amitiza 86mg qhs, prn MiraLax, prn Senna S 03/29/16 KUB no bowel obstruction,    Hypothyroidism Taking Levothyroxine 1380m, last TSH 2.58 11/27/15  AKI (acute kidney injury) (HCDowagiac3/1/18 creat 0.71  RLS (restless legs syndrome) Prn Requip and Clonazepam available to him  Urinary frequency No urinary retention, taking Tamsulosin 0.29m75maily.   Insomnia Taking Trazodone 44m103m night.   Depression with anxiety Stable, continue Trazodone 44mg329m prn Clonazepam.      Family/ staff Communication: IL when able.   Labs/tests ordered: none

## 2016-04-05 NOTE — Assessment & Plan Note (Signed)
Taking Levothyroxine 137mcg, last TSH 2.58 11/27/15 

## 2016-04-05 NOTE — Assessment & Plan Note (Signed)
04/04/16 creat 0.71

## 2016-04-05 NOTE — Assessment & Plan Note (Addendum)
Controlled, prn Torsemide available for edema and HTN 

## 2016-04-05 NOTE — Assessment & Plan Note (Signed)
Compensated clinically, chronic BLE edema, continue Torsemide prn, BNP 96.8 02/21/15. 04/06/15 Na 142, K 4.6, Bun 16, creat 0.89, 04/20/14 BNP 111.7, Na 142, K 4.6, Bun 16, creat 0.9   

## 2016-04-05 NOTE — Assessment & Plan Note (Signed)
04/04/16 Dr Croitoru Midodrine 10mg 8am, 5mg 12N, 5mg 4pm 

## 2016-04-05 NOTE — Assessment & Plan Note (Signed)
Stable, continue Protonix daily.  

## 2016-04-05 NOTE — Assessment & Plan Note (Signed)
Taking Trazodone 50mg at night.  

## 2016-04-05 NOTE — Assessment & Plan Note (Signed)
No urinary retention, taking Tamsulosin 0.4mg  daily.

## 2016-04-05 NOTE — Assessment & Plan Note (Signed)
Prn Requip and Clonazepam available to him 

## 2016-04-05 NOTE — Assessment & Plan Note (Addendum)
04/01/16 X-ray R humerus and elbow: no definite acute fx or dislocation. Chronic rotator cuff tear R elbow warm, pain, swelling, Hx of gout, no reduced ROM, X-ray of the right humerus and elbow showed no acute fx or dislocation.  Gout vs cellulitis vs DVT, update uric acid, ESR, RAF, CBC, CMP, venous US RUE Medrol dose pk, continue Mobic, Doxycycline 138m bid x 7 days 04/05/16 improved, 04/04/16 venous [place] no DVT, Na 138, K 4.6, Bun 15, creat 0.71, wbc 5.6, Hgb 13.5, plt 225, uric acid 2.7

## 2016-04-09 DIAGNOSIS — M19021 Primary osteoarthritis, right elbow: Secondary | ICD-10-CM | POA: Insufficient documentation

## 2016-04-15 ENCOUNTER — Encounter: Payer: Self-pay | Admitting: Internal Medicine

## 2016-04-15 ENCOUNTER — Non-Acute Institutional Stay (SKILLED_NURSING_FACILITY): Payer: Medicare Other | Admitting: Internal Medicine

## 2016-04-15 DIAGNOSIS — R059 Cough, unspecified: Secondary | ICD-10-CM

## 2016-04-15 DIAGNOSIS — J101 Influenza due to other identified influenza virus with other respiratory manifestations: Secondary | ICD-10-CM | POA: Diagnosis not present

## 2016-04-15 DIAGNOSIS — R531 Weakness: Secondary | ICD-10-CM | POA: Diagnosis not present

## 2016-04-15 DIAGNOSIS — K0510 Chronic gingivitis, plaque induced: Secondary | ICD-10-CM | POA: Diagnosis not present

## 2016-04-15 DIAGNOSIS — R05 Cough: Secondary | ICD-10-CM | POA: Diagnosis not present

## 2016-04-15 DIAGNOSIS — N179 Acute kidney failure, unspecified: Secondary | ICD-10-CM | POA: Diagnosis not present

## 2016-04-15 DIAGNOSIS — L03113 Cellulitis of right upper limb: Secondary | ICD-10-CM

## 2016-04-15 NOTE — Progress Notes (Signed)
Progress Note    Location:  [facility] Room Number: N35 Place of Service:  SNF [phone]) Provider:  Jeanmarie Hubert, MD  Patient Care Team: Estill Dooms, MD as PCP - General (Internal Medicine) [name], MD as Attending Physician (Cardiology) Vevelyn Royals, MD as Consulting Physician (Ophthalmology) Franchot Gallo, MD as Consulting Physician (Urology) Tanda Rockers, MD as Consulting Physician (Pulmonary Disease) Allyn Kenner, MD (Dermatology) Deliah Goody, PA-C as Physician Assistant (Physician Assistant) Man Otho Darner, NP as Nurse Practitioner (Internal Medicine)  Extended Emergency Contact Information Primary Emergency Contact: Brinlee,[name] Address: [place], [place] Phone: [phone] Mobile Phone: [phone] Relation: Spouse Secondary Emergency Contact: [name],Yontz  [place] Phone: [phone] Mobile Phone: [phone] Relation: Daughter  Code Status:  Full Code Goals of care: Advanced Directive information Advanced Directives 04/15/2016  Does Patient Have a Medical Advance Directive? Yes  Type of Paramedic of [place];Living will  Does patient want to make changes to medical advance directive? -  Copy of [place] in Chart? Yes  Would patient like information on creating a medical advance directive? -  Pre-existing out of facility DNR order (yellow form or pink MOST form) -     Chief Complaint  Patient presents with  . Acute Visit    elbow    HPI:  Pt is a 83 y.o. male seen today for an acute visit for low BP. BP recorded as low as 84/60. He was resumed on midodrine 04/04/16. Currently 10 mg AM, 5 mg Noon, and 5mg  4PM.   Complains of dry cough that may occur any time, night or day. Not necessarily associated with meals or oral intake. He has explosions of coughing. Denies significant dyspnea.  Continue with a  persistence of weakness and is requiring 2 to get our of bed safely. He is not safe walking independently or even attempting to stand.  Complains of pain in the gums. Has seen his dentist previously.   Past Medical History:  Diagnosis Date  . Arthritis    "minor, back and sometimes knees" (12/15/2012)  . Bradycardia    AFib/SSS s/p St Jude PPM 04/12/2008  . CAD (coronary artery disease) 12/30/2014   CABG (LIMA-LAD, SVG-RCA, SVG-OM in 1996).  07/2009 BMS to SVG-RCA. Cath in 04/2010 with patent stents   . Cardiomyopathy, ischemic 08/25/2012  . CHF (congestive heart failure) ([place])   . Chronic knee pain 12/03/2014  . Combined congestive systolic and diastolic heart failure ([place]) 02/02/2015   Hx EF 41%. BNP 96.8 02/21/15 Torsemide 04/06/15 Na 142, K 4.6, Bun 16, creat 0.89 04/20/14 BNP 111.7, Na 142, K 4.6, Bun 16, creat 0.9   . Depression with anxiety 02/02/2015   02/21/15 Hgb A1c 5.8 03/10/15 MMSE 30/30   . Dizziness, after diuretic asscoiated with hypotension and responded to fluid bolus 06/05/2011   04/28/15 US carotid R+L normal bilateral arterial velocities.    Marland Kitchen Dyspnea 08/11/2014   Followed in Pulmonary clinic/ [facility]/ [place]  - 08/11/2014  Walked RA x 1 laps @ 185 ft each stopped due to fatigue/off balance/ slow pace/  no sob or desat  - PFT's  09/26/2014  FEV1 2.26 (85 % ) ratio 76  p no % improvement from saba with DLCO  67 % corrects to 93 % for alv volume      Since prev study 08/04/13 minimal  change lung vol or dlco    . Embolic cerebral infarction ([place]) 12/06/2015  . Exertional shortness of breath    "sometimes walking" (12/15/2012)  . GERD (gastroesophageal reflux disease)   . Gout 02/09/2015  . Heart murmur    "just told I had one today" (12/15/2012)  . Hiatal hernia   . Hyperlipidemia   . Hypertension   . Hypothyroid   . Influenza A 03/10/2016  . Insomnia   . Melanoma of back ([place]) 1976  . Myocardial infarction 1996; 2011   "both silent" (12/15/2012)  . Nonrheumatic aortic  valve stenosis   . Orthostatic hypotension   . Osteoporosis, senile   . Pacemaker   . RBBB   . Restless leg 02/02/2015  . Right leg weakness 12/06/2015  . [facility] spotted fever   . S/P CABG x 4   . Sick sinus syndrome ([place]) 01/31/2014  . Sustained ventricular tachycardia ([place]) 07/27/2014  . Urinary retention 12/30/2014   Past Surgical History:  Procedure Laterality Date  . CARDIAC CATHETERIZATION  04/2010   LIMA to LAD patent,SVG to OM patent,no in-stnet restenosis RCA  . CATARACT EXTRACTION W/ INTRAOCULAR LENS  IMPLANT, BILATERAL Bilateral 2012  . CORONARY ANGIOPLASTY WITH STENT PLACEMENT  07/2009   bare metal stent to SVG to the RCA  . CORONARY ARTERY BYPASS GRAFT  1996   LIMA to LAD,SVG to RCA & SVG to OM  . INSERT / REPLACE / REMOVE PACEMAKER  2010  . MELANOMA EXCISION  05/1974 X2   "taken off my back" (12/15/2012)  . NM MYOVIEW LTD  06/2011   low risk  . TONSILLECTOMY  1938  . TRANSURETHRAL RESECTION OF PROSTATE  1986  . US ECHOCARDIOGRAPHY  07/11/2009   EF 45-50%    Allergies  Allergen Reactions  . Altace [Ramipril] Cough  . Crestor [Rosuvastatin Calcium] Rash  . Penicillins Rash and Other (See Comments)    Has patient had a PCN reaction causing immediate rash, facial/tongue/throat swelling, SOB or lightheadedness with hypotension: No Has patient had a PCN reaction causing severe rash involving mucus membranes or skin necrosis: Yes Has patient had a PCN reaction that required hospitalization Yes Has patient had a PCN reaction occurring within the last 10 years: No If all of the above answers are "NO", then may proceed with Cephalosporin use.   . Sulfa Antibiotics Rash    Allergies as of 04/15/2016      Reactions   Altace [ramipril] Cough   Crestor [rosuvastatin Calcium] Rash   Penicillins Rash, Other (See Comments)   Has patient had a PCN reaction causing immediate rash, facial/tongue/throat swelling, SOB or lightheadedness with hypotension: No Has patient had  a PCN reaction causing severe rash involving mucus membranes or skin necrosis: Yes Has patient had a PCN reaction that required hospitalization Yes Has patient had a PCN reaction occurring within the last 10 years: No If all of the above answers are "NO", then may proceed with Cephalosporin use.   Sulfa Antibiotics Rash      Medication List       Accurate as of 04/15/16 11:27 AM. Always use your most recent med list.          acetaminophen 325 MG tablet Commonly known as:  TYLENOL Take by mouth. Take 2 tablets every four hours for fever greater than 100.3 x 24 hours   amiodarone 200 MG tablet Commonly known as:  PACERONE Take 400 mg by mouth daily.   aspirin EC 81 MG tablet Take  1 tablet (81 mg total) by mouth daily.   clonazePAM 0.5 MG tablet Commonly known as:  KLONOPIN Take 0.5 mg by mouth 3 (three) times daily as needed for anxiety.   clopidogrel 75 MG tablet Commonly known as:  PLAVIX Take 1 tablet (75 mg total) by mouth daily.   levothyroxine 137 MCG tablet Commonly known as:  SYNTHROID, LEVOTHROID Take 1 tablet (137 mcg total) by mouth daily before breakfast.   lubiprostone 24 MCG capsule Commonly known as:  AMITIZA Take 24 mcg by mouth at bedtime.   midodrine 10 MG tablet Commonly known as:  PROAMATINE Take 10 mg by mouth. Take one tablet once a day   midodrine 5 MG tablet Commonly known as:  PROAMATINE Take 5 mg by mouth. Take one tablet twice a day   pantoprazole 40 MG tablet Commonly known as:  PROTONIX Take 40 mg by mouth at bedtime.   polyethylene glycol packet Commonly known as:  MIRALAX / GLYCOLAX Take 17 g by mouth daily as needed for mild constipation.   pravastatin 40 MG tablet Commonly known as:  PRAVACHOL Take 1 tablet by mouth daily.   rOPINIRole 0.5 MG tablet Commonly known as:  REQUIP Take 0.5 mg by mouth 3 (three) times daily as needed (for restless leg syndrome).   senna-docusate 8.6-50 MG tablet Commonly known as:   Senokot-S Take 4 tablets by mouth daily as needed for mild constipation.   tamsulosin 0.4 MG Caps capsule Commonly known as:  FLOMAX Take 0.4 mg by mouth at bedtime.   torsemide 20 MG tablet Commonly known as:  DEMADEX Take 20 mg by mouth daily as needed (for weight gain of 3 or more lbs).   traZODone 50 MG tablet Commonly known as:  DESYREL Take 50 mg by mouth at bedtime.   triamcinolone cream 0.1 % Commonly known as:  KENALOG Apply 1 application topically 2 (two) times daily as needed (for irritation).   Vitamin D3 5000 units Caps Take 5,000 Units by mouth every Monday.       Review of Systems  Constitutional: Positive for appetite change and fatigue. Negative for activity change, fever and unexpected weight change.  HENT: Positive for hearing loss and tinnitus. Negative for congestion, ear pain, rhinorrhea, sore throat, trouble swallowing and voice change.   Eyes: Negative for photophobia and pain.       Corrective lenses  Respiratory: Positive for cough and shortness of breath. Negative for choking, chest tightness and wheezing.   Cardiovascular: Positive for leg swelling (2+ and usingcompression hosiery). Negative for chest pain and palpitations.       Congestive cardiomyopathy. Coronary artery disease, status post CABG. Cardiac arrhythmias, status post pacemaker implanted left upper chest.  Gastrointestinal: Positive for constipation. Negative for abdominal distention, abdominal pain, diarrhea, nausea and vomiting.  Endocrine: Negative for cold intolerance, heat intolerance, polydipsia, polyphagia and polyuria.  Genitourinary: Positive for enuresis and frequency. Negative for dysuria, hematuria, testicular pain and urgency.       Double voids. Urgency. Frequency. Nocturia x 7.  Musculoskeletal: Positive for gait problem (using rolling walker). Negative for arthralgias, back pain, myalgias and neck pain.       Ambulates with walker.   Skin: Positive for wound (improvement  in left 2nd toe medial side ulcereation and swelling and erythema of the entire toe.). Negative for color change, pallor and rash (both arms. Seborrhea capitis.).       Chronic medial right buttock pressure ulcer. R elbow warm, pain, swelling, Hx of gout,  no reduced ROM, X-ray of the right humerus and elbow showed no acute fx or dislocation. Near resolution.    Allergic/Immunologic: Negative.   Neurological: Positive for dizziness, speech difficulty (sometimes has difficulty with word finding) and weakness. Negative for tremors, syncope, numbness and headaches.       MRI brain disclosed cerebral atrophy and microvascular disease.  Insomnia and restless legs are improved. Patient reports that he has H/O dizziness. This makes the patient feel unbalanced. R hand numbness.  Cognitive decline since 2014. Restless legs day and night.  Hematological: Negative for adenopathy. Does not bruise/bleed easily.  Psychiatric/Behavioral: Positive for sleep disturbance (nocturia x 7). Negative for behavioral problems, confusion, decreased concentration, hallucinations and suicidal ideas. The patient is not nervous/anxious.        Not enough sleep, decreased energy     Immunization History  Administered Date(s) Administered  . Influenza-Unspecified 12/05/2013, 11/05/2014, 11/16/2015  . PPD Test 03/07/2014  . Pneumococcal-Unspecified 10/05/2009   Pertinent  Health Maintenance Due  Topic Date Due  . PNA vac Low Risk Adult (2 of 2 - PCV13) 10/06/2010  . DEXA SCAN  12/14/2024  . INFLUENZA VACCINE  Completed   Fall Risk  10/30/2015 09/05/2015 02/02/2015  Falls in the past year? No No No  Risk for fall due to : Impaired balance/gait - -    Vitals:   04/15/16 1052  BP: 106/70  Pulse: 68  Resp: 16  Temp: 97 F (36.1 C)  SpO2: 97%  Weight: 174 lb 12.8 oz (79.3 kg)  Height: 5\' 11"  (1.803 m)   Body mass index is 24.38 kg/m. Physical Exam  Labs reviewed:  Recent Labs  01/11/16 0537 03/10/16 1443  03/11/16 0436 04/03/16  NA 140 140 139 138  K 4.1 3.5 3.7 4.6  CL 106 104 108  --   CO2 27 29 25   --   GLUCOSE 97 96 105*  --   BUN 13 22* 18 15  CREATININE 0.79 1.03 0.72 0.7  CALCIUM 8.8* 8.7* 8.0*  --     Recent Labs  01/10/16 1400 03/10/16 1443 03/11/16 0436 04/03/16  AST 25 53* 46* 15  ALT 22 26 23 14   ALKPHOS 68 61 51 60  BILITOT 0.4 0.6 0.4  --   PROT 6.4* 6.9 5.8*  --   ALBUMIN 3.5 3.7 3.1*  --     Recent Labs  12/06/15 1617 01/10/16 1400  01/11/16 0537 03/10/16 1443 03/11/16 0436 04/03/16  WBC 5.4 6.0  --  6.4 8.3 6.3 5.6  NEUTROABS 3.3 4.1  --   --  5.8  --   --   HGB 13.5 14.1  < > 13.9 13.1 12.0* 13.5  HCT 41.4 42.7  < > 42.2 39.5 36.9* 40*  MCV 98.6 97.7  --  97.2 95.6 95.8  --   PLT 183 190  --  188 190 176 225  < > = values in this interval not displayed. Lab Results  Component Value Date   TSH 2.58 11/27/2015   Lab Results  Component Value Date   HGBA1C 5.6 12/07/2015   Lab Results  Component Value Date   CHOL 84 12/07/2015   HDL 41 12/07/2015   LDLCALC 23 12/07/2015   TRIG 102 12/07/2015   CHOLHDL 2.0 12/07/2015    Assessment/Plan 1. Influenza A Resolved, with residual weakness  2. AKI (acute kidney injury) ([place]) resolved  3. Cellulitis of right arm resolved  4. Weak Continue with PT. Safe mobility is the main  hindrance to getting him back to his apt with his wife.  5. Gingivitis Needs follow up with his dentist. Although I offered Magic Mouthwash or oral antibiotic, he prefers to see his dentist first.   6. Cough Schedule bedside swallowing eval with Speech therapist.  7. Hypotension. Continue midodrine for now.

## 2016-04-16 ENCOUNTER — Encounter: Payer: Self-pay | Admitting: Internal Medicine

## 2016-04-22 ENCOUNTER — Encounter: Payer: Self-pay | Admitting: Internal Medicine

## 2016-04-22 ENCOUNTER — Non-Acute Institutional Stay (SKILLED_NURSING_FACILITY): Payer: Medicare Other | Admitting: Internal Medicine

## 2016-04-22 DIAGNOSIS — G2581 Restless legs syndrome: Secondary | ICD-10-CM

## 2016-04-22 DIAGNOSIS — I951 Orthostatic hypotension: Secondary | ICD-10-CM | POA: Diagnosis not present

## 2016-04-22 DIAGNOSIS — I5042 Chronic combined systolic (congestive) and diastolic (congestive) heart failure: Secondary | ICD-10-CM | POA: Diagnosis not present

## 2016-04-22 MED ORDER — FLUDROCORTISONE ACETATE 0.1 MG PO TABS: ORAL_TABLET | ORAL | 5 refills | Status: DC

## 2016-04-22 NOTE — Progress Notes (Signed)
Progress Note    Location:  [place] Room Number: N35 Place of Service:  SNF [phone]) Provider:  Jeanmarie Hubert, MD  Patient Care Team: Estill Dooms, MD as PCP - General (Internal Medicine) [name], MD as Attending Physician (Cardiology) Vevelyn Royals, MD as Consulting Physician (Ophthalmology) Franchot Gallo, MD as Consulting Physician (Urology) Tanda Rockers, MD as Consulting Physician (Pulmonary Disease) Allyn Kenner, MD (Dermatology) Deliah Goody, PA-C as Physician Assistant (Physician Assistant) Man Otho Darner, NP as Nurse Practitioner (Internal Medicine)  Extended Emergency Contact Information Primary Emergency Contact: Montminy,[name] Address: [place], [place] Phone: [phone] Mobile Phone: [phone] Relation: Spouse Secondary Emergency Contact: [name],Klaus  [place] Phone: [phone] Mobile Phone: [phone] Relation: Daughter  Code Status:  Full Code Goals of care: Advanced Directive information Advanced Directives 04/22/2016  Does Patient Have a Medical Advance Directive? Yes  Type of Paramedic of [place];Living will  Does patient want to make changes to medical advance directive? -  Copy of [place] in Chart? Yes  Would patient like information on creating a medical advance directive? -  Pre-existing out of facility DNR order (yellow form or pink MOST form) -     Chief Complaint  Patient presents with  . Acute Visit    per PT blood pressure low, patient complainting of dizziness, refuse to walk. BP with dressing without dizziness 84/58, sitting without dizziness 84/48. sitting 89/55 with repositioning in chair with dizziness. sitting after muscle pump exsses 87/57 with dizziness., standing after exsses 85/63 with worsened dizziness    HPI:  Pt is a 83 y.o. male seen today for an acute visit for  reevaluation of low BP. This has been a difficult to resolve problem. He does not seem to be responding to the midodrine. He becomes symptomatic with standing and feels dizzy and like he will collapse. Denies headache. Or palpitations.   Cough continues to be a problem. Bedside swallowing eval did not immediately disclose aspiration, but the examiner is recommending MBSS.  Past Medical History:  Diagnosis Date  . Arthritis    "minor, back and sometimes knees" (12/15/2012)  . Bradycardia    AFib/SSS s/p St Jude PPM 04/12/2008  . CAD (coronary artery disease) 12/30/2014   CABG (LIMA-LAD, SVG-RCA, SVG-OM in 1996).  07/2009 BMS to SVG-RCA. Cath in 04/2010 with patent stents   . Cardiomyopathy, ischemic 08/25/2012  . CHF (congestive heart failure) ([place])   . Chronic knee pain 12/03/2014  . Combined congestive systolic and diastolic heart failure ([place]) 02/02/2015   Hx EF 41%. BNP 96.8 02/21/15 Torsemide 04/06/15 Na 142, K 4.6, Bun 16, creat 0.89 04/20/14 BNP 111.7, Na 142, K 4.6, Bun 16, creat 0.9   . Depression with anxiety 02/02/2015   02/21/15 Hgb A1c 5.8 03/10/15 MMSE 30/30   . Dizziness, after diuretic asscoiated with hypotension and responded to fluid bolus 06/05/2011   04/28/15 US carotid R+L normal bilateral arterial velocities.    Marland Kitchen Dyspnea 08/11/2014   Followed in Pulmonary clinic/ [facility]/ [facility]  - 08/11/2014  Walked RA x 1 laps @ 185 ft each stopped due to fatigue/off balance/ slow pace/  no sob or desat  - PFT's  09/26/2014  FEV1 2.26 (85 % ) ratio 76  p no % improvement from saba with DLCO  67 % corrects to 93 % for alv volume  Since prev study 08/04/13 minimal change lung vol or dlco    . Embolic cerebral infarction ([place]) 12/06/2015  . Exertional shortness of breath    "sometimes walking" (12/15/2012)  . GERD (gastroesophageal reflux disease)   . Gout 02/09/2015  . Heart murmur    "just told I had one today" (12/15/2012)  . Hiatal hernia   . Hyperlipidemia   . Hypertension   .  Hypothyroid   . Influenza A 03/10/2016  . Insomnia   . Melanoma of back ([place]) 1976  . Myocardial infarction 1996; 2011   "both silent" (12/15/2012)  . Nonrheumatic aortic valve stenosis   . Orthostatic hypotension   . Osteoporosis, senile   . Pacemaker   . RBBB   . Restless leg 02/02/2015  . Right leg weakness 12/06/2015  . [facility] spotted fever   . S/P CABG x 4   . Sick sinus syndrome ([place]) 01/31/2014  . Sustained ventricular tachycardia ([place]) 07/27/2014  . Urinary retention 12/30/2014   Past Surgical History:  Procedure Laterality Date  . CARDIAC CATHETERIZATION  04/2010   LIMA to LAD patent,SVG to OM patent,no in-stnet restenosis RCA  . CATARACT EXTRACTION W/ INTRAOCULAR LENS  IMPLANT, BILATERAL Bilateral 2012  . CORONARY ANGIOPLASTY WITH STENT PLACEMENT  07/2009   bare metal stent to SVG to the RCA  . CORONARY ARTERY BYPASS GRAFT  1996   LIMA to LAD,SVG to RCA & SVG to OM  . INSERT / REPLACE / REMOVE PACEMAKER  2010  . MELANOMA EXCISION  05/1974 X2   "taken off my back" (12/15/2012)  . NM MYOVIEW LTD  06/2011   low risk  . TONSILLECTOMY  1938  . TRANSURETHRAL RESECTION OF PROSTATE  1986  . US ECHOCARDIOGRAPHY  07/11/2009   EF 45-50%    Allergies  Allergen Reactions  . Altace [Ramipril] Cough  . Crestor [Rosuvastatin Calcium] Rash  . Penicillins Rash and Other (See Comments)    Has patient had a PCN reaction causing immediate rash, facial/tongue/throat swelling, SOB or lightheadedness with hypotension: No Has patient had a PCN reaction causing severe rash involving mucus membranes or skin necrosis: Yes Has patient had a PCN reaction that required hospitalization Yes Has patient had a PCN reaction occurring within the last 10 years: No If all of the above answers are "NO", then may proceed with Cephalosporin use.   . Sulfa Antibiotics Rash    Allergies as of 04/22/2016      Reactions   Altace [ramipril] Cough   Crestor [rosuvastatin Calcium] Rash   Penicillins  Rash, Other (See Comments)   Has patient had a PCN reaction causing immediate rash, facial/tongue/throat swelling, SOB or lightheadedness with hypotension: No Has patient had a PCN reaction causing severe rash involving mucus membranes or skin necrosis: Yes Has patient had a PCN reaction that required hospitalization Yes Has patient had a PCN reaction occurring within the last 10 years: No If all of the above answers are "NO", then may proceed with Cephalosporin use.   Sulfa Antibiotics Rash      Medication List       Accurate as of 04/22/16 12:56 PM. Always use your most recent med list.          acetaminophen 325 MG tablet Commonly known as:  TYLENOL Take by mouth. Take 2 tablets every four hours for fever greater than 100.3 x 24 hours   amiodarone 200 MG tablet Commonly known as:  PACERONE Take 400 mg by mouth daily.   aspirin  EC 81 MG tablet Take 1 tablet (81 mg total) by mouth daily.   clobetasol 0.05 % external solution Commonly known as:  TEMOVATE Apply 1 application topically. Apply one application topically as needed for itchy scalp   clonazePAM 0.5 MG tablet Commonly known as:  KLONOPIN Take 0.5 mg by mouth 3 (three) times daily as needed for anxiety.   clopidogrel 75 MG tablet Commonly known as:  PLAVIX Take 1 tablet (75 mg total) by mouth daily.   Dextromethorphan-Guaifenesin 10-100 MG/5ML liquid Take by mouth. 10 ml every 6 hours as needed for cough for 48 hours   levothyroxine 137 MCG tablet Commonly known as:  SYNTHROID, LEVOTHROID Take 1 tablet (137 mcg total) by mouth daily before breakfast.   lubiprostone 24 MCG capsule Commonly known as:  AMITIZA Take 24 mcg by mouth at bedtime.   meloxicam 7.5 MG tablet Commonly known as:  MOBIC Take 7.5 mg by mouth. Take one tablet daily   midodrine 10 MG tablet Commonly known as:  PROAMATINE Take 10 mg by mouth. Take one tablet once a day   midodrine 5 MG tablet Commonly known as:  PROAMATINE Take 5 mg  by mouth. Take one tablet twice a day   pantoprazole 40 MG tablet Commonly known as:  PROTONIX Take 40 mg by mouth at bedtime.   polyethylene glycol packet Commonly known as:  MIRALAX / GLYCOLAX Take 17 g by mouth daily as needed for mild constipation.   pravastatin 40 MG tablet Commonly known as:  PRAVACHOL Take 1 tablet by mouth daily.   rOPINIRole 0.5 MG tablet Commonly known as:  REQUIP Take 0.5 mg by mouth 3 (three) times daily as needed (for restless leg syndrome).   senna-docusate 8.6-50 MG tablet Commonly known as:  Senokot-S Take 4 tablets by mouth daily as needed for mild constipation.   tamsulosin 0.4 MG Caps capsule Commonly known as:  FLOMAX Take 0.4 mg by mouth at bedtime.   torsemide 20 MG tablet Commonly known as:  DEMADEX Take 20 mg by mouth daily as needed (for weight gain of 3 or more lbs).   traZODone 50 MG tablet Commonly known as:  DESYREL Take 50 mg by mouth at bedtime.   triamcinolone cream 0.1 % Commonly known as:  KENALOG Apply 1 application topically 2 (two) times daily as needed (for irritation).   Vitamin D3 5000 units Caps Take 5,000 Units by mouth every Monday.       Review of Systems  Constitutional: Positive for appetite change and fatigue. Negative for activity change, fever and unexpected weight change.  HENT: Positive for hearing loss and tinnitus. Negative for congestion, ear pain, rhinorrhea, sore throat, trouble swallowing and voice change.   Eyes: Negative for photophobia and pain.       Corrective lenses  Respiratory: Positive for cough and shortness of breath. Negative for choking, chest tightness and wheezing.   Cardiovascular: Positive for leg swelling (2+ and usingcompression hosiery). Negative for chest pain and palpitations.       Congestive cardiomyopathy. Coronary artery disease, status post CABG. Cardiac arrhythmias, status post pacemaker implanted left upper chest.  Gastrointestinal: Positive for constipation.  Negative for abdominal distention, abdominal pain, diarrhea, nausea and vomiting.  Endocrine: Negative for cold intolerance, heat intolerance, polydipsia, polyphagia and polyuria.  Genitourinary: Positive for enuresis and frequency. Negative for dysuria, hematuria, testicular pain and urgency.       Double voids. Urgency. Frequency. Nocturia x 7.  Musculoskeletal: Positive for gait problem (using rolling walker).  Negative for arthralgias, back pain, myalgias and neck pain.       Ambulates with walker.   Skin: Positive for wound (improvement in left 2nd toe medial side ulcereation and swelling and erythema of the entire toe.). Negative for color change, pallor and rash (both arms. Seborrhea capitis.).       Chronic medial right buttock pressure ulcer. R elbow warm, pain, swelling, Hx of gout, no reduced ROM, X-ray of the right humerus and elbow showed no acute fx or dislocation. Near resolution.    Allergic/Immunologic: Negative.   Neurological: Positive for dizziness, speech difficulty (sometimes has difficulty with word finding) and weakness. Negative for tremors, syncope, numbness and headaches.       MRI brain disclosed cerebral atrophy and microvascular disease.  Insomnia and restless legs are improved. Patient reports that he has H/O dizziness. This makes the patient feel unbalanced. R hand numbness.  Cognitive decline since 2014. Restless legs day and night.  Hematological: Negative for adenopathy. Does not bruise/bleed easily.  Psychiatric/Behavioral: Positive for sleep disturbance (nocturia x 7). Negative for behavioral problems, confusion, decreased concentration, hallucinations and suicidal ideas. The patient is not nervous/anxious.        Not enough sleep, decreased energy     Immunization History  Administered Date(s) Administered  . Influenza-Unspecified 12/05/2013, 11/05/2014, 11/16/2015  . PPD Test 03/07/2014  . Pneumococcal-Unspecified 10/05/2009   Pertinent  Health  Maintenance Due  Topic Date Due  . PNA vac Low Risk Adult (2 of 2 - PCV13) 10/06/2010  . DEXA SCAN  12/14/2024  . INFLUENZA VACCINE  Completed   Fall Risk  10/30/2015 09/05/2015 02/02/2015  Falls in the past year? No No No  Risk for fall due to : Impaired balance/gait - -   Functional Status Survey:    Vitals:   04/22/16 1238  BP: (!) 85/57  Pulse: 69  Resp: 18  Temp: 97.4 F (36.3 C)  SpO2: 96%  Weight: 174 lb 12.8 oz (79.3 kg)  Height: 5\' 11"  (1.803 m)   Body mass index is 24.38 kg/m. Physical Exam  Constitutional: He is oriented to person, place, and time. He appears well-developed and well-nourished. No distress.  Frail.  HENT:  Head: Normocephalic and atraumatic.  Right Ear: External ear normal.  Left Ear: External ear normal.  Nose: Nose normal.  Mouth/Throat: Oropharynx is clear and moist. No oropharyngeal exudate.  Eyes: Conjunctivae and EOM are normal. Pupils are equal, round, and reactive to light. Right eye exhibits no discharge. Left eye exhibits no discharge.  Neck: Normal range of motion. Neck supple. No JVD present. No tracheal deviation present. No thyromegaly present.  Cardiovascular: Normal rate, regular rhythm and intact distal pulses.  Exam reveals no gallop and no friction rub.   Murmur heard. 2-7/7 systolic  Pulmonary/Chest: Effort normal and breath sounds normal. No respiratory distress. He has no wheezes. He has no rales. He exhibits no tenderness.  Abdominal: He exhibits no distension and no mass. There is no tenderness.  abd hernia  Genitourinary: No penile tenderness.  Musculoskeletal: Normal range of motion. He exhibits edema (trace to 1+ bipedal). He exhibits no tenderness.  Gait disturbance and loss of balance. Patient arrives in a  wheelchair today.  Lymphadenopathy:    He has no cervical adenopathy.  Neurological: He is alert and oriented to person, place, and time. He has normal reflexes. No cranial nerve deficit. He exhibits normal muscle  tone. Coordination normal.  Skin: Skin is warm and dry. No rash noted. No  erythema. No pallor.  Seborrhea of the scalp. Resolved cellulitis of the left 2nd toe. Ulcer medially of the left 2nd toe. R elbow warm, pain, swelling, Hx of gout, no reduced ROM, X-ray of the right humerus and elbow showed no acute fx or dislocation. Near resolution.    Psychiatric: He has a normal mood and affect. His behavior is normal. Judgment and thought content normal.    Labs reviewed:  Recent Labs  01/11/16 0537 03/10/16 1443 03/11/16 0436 04/03/16  NA 140 140 139 138  K 4.1 3.5 3.7 4.6  CL 106 104 108  --   CO2 27 29 25   --   GLUCOSE 97 96 105*  --   BUN 13 22* 18 15  CREATININE 0.79 1.03 0.72 0.7  CALCIUM 8.8* 8.7* 8.0*  --     Recent Labs  01/10/16 1400 03/10/16 1443 03/11/16 0436 04/03/16  AST 25 53* 46* 15  ALT 22 26 23 14   ALKPHOS 68 61 51 60  BILITOT 0.4 0.6 0.4  --   PROT 6.4* 6.9 5.8*  --   ALBUMIN 3.5 3.7 3.1*  --     Recent Labs  12/06/15 1617 01/10/16 1400  01/11/16 0537 03/10/16 1443 03/11/16 0436 04/03/16  WBC 5.4 6.0  --  6.4 8.3 6.3 5.6  NEUTROABS 3.3 4.1  --   --  5.8  --   --   HGB 13.5 14.1  < > 13.9 13.1 12.0* 13.5  HCT 41.4 42.7  < > 42.2 39.5 36.9* 40*  MCV 98.6 97.7  --  97.2 95.6 95.8  --   PLT 183 190  --  188 190 176 225  < > = values in this interval not displayed. Lab Results  Component Value Date   TSH 2.58 11/27/2015   Lab Results  Component Value Date   HGBA1C 5.6 12/07/2015   Lab Results  Component Value Date   CHOL 84 12/07/2015   HDL 41 12/07/2015   LDLCALC 23 12/07/2015   TRIG 102 12/07/2015   CHOLHDL 2.0 12/07/2015    Assessment/Plan  1. Orthostatic hypotension - Afdd florinef 0.1 mg qd to the midodrine.  2. Chronic combined systolic and diastolic congestive heart failure (HCC) Stable and compensated  3. RLS (restless legs syndrome) Symptoms are improved.

## 2016-04-23 ENCOUNTER — Encounter: Payer: Self-pay | Admitting: Nurse Practitioner

## 2016-04-23 ENCOUNTER — Non-Acute Institutional Stay (SKILLED_NURSING_FACILITY): Payer: Medicare Other | Admitting: Nurse Practitioner

## 2016-04-23 ENCOUNTER — Telehealth: Payer: Self-pay | Admitting: Cardiovascular Disease

## 2016-04-23 DIAGNOSIS — I951 Orthostatic hypotension: Secondary | ICD-10-CM

## 2016-04-23 DIAGNOSIS — I10 Essential (primary) hypertension: Secondary | ICD-10-CM

## 2016-04-23 DIAGNOSIS — F418 Other specified anxiety disorders: Secondary | ICD-10-CM

## 2016-04-23 DIAGNOSIS — M1990 Unspecified osteoarthritis, unspecified site: Secondary | ICD-10-CM | POA: Insufficient documentation

## 2016-04-23 DIAGNOSIS — G459 Transient cerebral ischemic attack, unspecified: Secondary | ICD-10-CM | POA: Diagnosis not present

## 2016-04-23 DIAGNOSIS — G2581 Restless legs syndrome: Secondary | ICD-10-CM

## 2016-04-23 DIAGNOSIS — K2100 Gastro-esophageal reflux disease with esophagitis, without bleeding: Secondary | ICD-10-CM

## 2016-04-23 DIAGNOSIS — K5900 Constipation, unspecified: Secondary | ICD-10-CM

## 2016-04-23 DIAGNOSIS — I5042 Chronic combined systolic (congestive) and diastolic (congestive) heart failure: Secondary | ICD-10-CM

## 2016-04-23 DIAGNOSIS — R339 Retention of urine, unspecified: Secondary | ICD-10-CM | POA: Diagnosis not present

## 2016-04-23 DIAGNOSIS — G4700 Insomnia, unspecified: Secondary | ICD-10-CM

## 2016-04-23 DIAGNOSIS — I472 Ventricular tachycardia, unspecified: Secondary | ICD-10-CM

## 2016-04-23 DIAGNOSIS — E039 Hypothyroidism, unspecified: Secondary | ICD-10-CM | POA: Diagnosis not present

## 2016-04-23 NOTE — Assessment & Plan Note (Signed)
Stable, continue Protonix daily.  

## 2016-04-23 NOTE — Assessment & Plan Note (Signed)
Compensated clinically, chronic BLE edema, continue Torsemide prn, BNP 96.8 02/21/15. 04/06/15 Na 142, K 4.6, Bun 16, creat 0.89, 04/20/14 BNP 111.7, Na 142, K 4.6, Bun 16, creat 0.9

## 2016-04-23 NOTE — Telephone Encounter (Signed)
Returned call to patient's wife no answer.LMTC. 

## 2016-04-23 NOTE — Assessment & Plan Note (Signed)
Stable, continue Trazodone 50mg  hs, prn Clonazepam 0.5mg  tid used 4x since 2/518, effective, no AR observed, will re-evaluate in a month.

## 2016-04-23 NOTE — Assessment & Plan Note (Signed)
Managed with daily Amitiza 86mcg qhs, prn MiraLax, prn Senna S 03/29/16 KUB no bowel obstruction,

## 2016-04-23 NOTE — Addendum Note (Signed)
Addended by: Estill Dooms on: 04/23/2016 05:49 PM   Modules accepted: Level of Service

## 2016-04-23 NOTE — Assessment & Plan Note (Signed)
Taking Levothyroxine 118mcg, last TSH 2.58 11/27/15

## 2016-04-23 NOTE — Progress Notes (Signed)
Location:  [facility] Room Number: 35 Place of Service:  SNF (31) Provider:  Natisha Trzcinski, Manxie    NP  Jeanmarie Hubert, MD  Patient Care Team: Estill Dooms, MD as PCP - General (Internal Medicine) [name], MD as Attending Physician (Cardiology) Vevelyn Royals, MD as Consulting Physician (Ophthalmology) Franchot Gallo, MD as Consulting Physician (Urology) Tanda Rockers, MD as Consulting Physician (Pulmonary Disease) Allyn Kenner, MD (Dermatology) Deliah Goody, PA-C as Physician Assistant (Physician Assistant) Aryani Daffern Otho Darner, NP as Nurse Practitioner (Internal Medicine)  Extended Emergency Contact Information Primary Emergency Contact: Craze,[name] Address: [place], [place] Phone: [phone] Mobile Phone: [phone] Relation: Spouse Secondary Emergency Contact: [name],Mengel  [place] Phone: [phone] Mobile Phone: [phone] Relation: Daughter  Code Status:  Full Code  Goals of care: Advanced Directive information Advanced Directives 04/23/2016  Does Patient Have a Medical Advance Directive? Yes  Type of Paramedic of [place];Living will  Does patient want to make changes to medical advance directive? No - Patient declined  Copy of [place] in Chart? Yes  Would patient like information on creating a medical advance directive? -  Pre-existing out of facility DNR order (yellow form or pink MOST form) -     Chief Complaint  Patient presents with  . Acute Visit    medication revision,    HPI:  Pt is a 83 y.o. male seen today for an acute visit for Clonazepam 0.5mg  tid prn was utilized 4x since 03/11/16, effective, no AR observed  Hx of VT, taking Amiodarone 400mg  daily, CHF managed with prn Torsemide 20mg , taking Levothyroxine 128mcg, last TSH 2.58 11/27/15. BPH, managed with Tamsulosin 0.4mg  daily, sleeps with aid of Trazodone  50mg  at night. His mood is stable, prn Clonazepam available to him. RLS prn Requip and Clonazepam available to him. Orthostatic hypotension is managed with 04/04/16 Dr Sallyanne Kuster Midodrine 10mg  8am, 5mg  12N, 5mg  4pm   Past Medical History:  Diagnosis Date  . Arthritis    "minor, back and sometimes knees" (12/15/2012)  . Bradycardia    AFib/SSS s/p St Jude PPM 04/12/2008  . CAD (coronary artery disease) 12/30/2014   CABG (LIMA-LAD, SVG-RCA, SVG-OM in 1996).  07/2009 BMS to SVG-RCA. Cath in 04/2010 with patent stents   . Cardiomyopathy, ischemic 08/25/2012  . CHF (congestive heart failure) ([place])   . Chronic knee pain 12/03/2014  . Combined congestive systolic and diastolic heart failure ([place]) 02/02/2015   Hx EF 41%. BNP 96.8 02/21/15 Torsemide 04/06/15 Na 142, K 4.6, Bun 16, creat 0.89 04/20/14 BNP 111.7, Na 142, K 4.6, Bun 16, creat 0.9   . Depression with anxiety 02/02/2015   02/21/15 Hgb A1c 5.8 03/10/15 MMSE 30/30   . Dizziness, after diuretic asscoiated with hypotension and responded to fluid bolus 06/05/2011   04/28/15 US carotid R+L normal bilateral arterial velocities.    Marland Kitchen Dyspnea 08/11/2014   Followed in Pulmonary clinic/ [facility]/ [facility]  - 08/11/2014  Walked RA x 1 laps @ 185 ft each stopped due to fatigue/off balance/ slow pace/  no sob or desat  - PFT's  09/26/2014  FEV1 2.26 (85 % ) ratio 76  p no % improvement from saba with DLCO  67 % corrects to 93 % for alv volume      Since prev study 08/04/13 minimal change lung vol or dlco    .  Embolic cerebral infarction ([place]) 12/06/2015  . Exertional shortness of breath    "sometimes walking" (12/15/2012)  . GERD (gastroesophageal reflux disease)   . Gout 02/09/2015  . Heart murmur    "just told I had one today" (12/15/2012)  . Hiatal hernia   . Hyperlipidemia   . Hypertension   . Hypothyroid   . Influenza A 03/10/2016  . Insomnia   . Melanoma of back ([place]) 1976  . Myocardial infarction 1996; 2011   "both silent" (12/15/2012)  . Nonrheumatic  aortic valve stenosis   . Orthostatic hypotension   . Osteoporosis, senile   . Pacemaker   . RBBB   . Restless leg 02/02/2015  . Right leg weakness 12/06/2015  . [facility] [place] spotted fever   . S/P CABG x 4   . Sick sinus syndrome ([place]) 01/31/2014  . Sustained ventricular tachycardia ([place]) 07/27/2014  . Urinary retention 12/30/2014   Past Surgical History:  Procedure Laterality Date  . CARDIAC CATHETERIZATION  04/2010   LIMA to LAD patent,SVG to OM patent,no in-stnet restenosis RCA  . CATARACT EXTRACTION W/ INTRAOCULAR LENS  IMPLANT, BILATERAL Bilateral 2012  . CORONARY ANGIOPLASTY WITH STENT PLACEMENT  07/2009   bare metal stent to SVG to the RCA  . CORONARY ARTERY BYPASS GRAFT  1996   LIMA to LAD,SVG to RCA & SVG to OM  . INSERT / REPLACE / REMOVE PACEMAKER  2010  . MELANOMA EXCISION  05/1974 X2   "taken off my back" (12/15/2012)  . NM MYOVIEW LTD  06/2011   low risk  . TONSILLECTOMY  1938  . TRANSURETHRAL RESECTION OF PROSTATE  1986  . US ECHOCARDIOGRAPHY  07/11/2009   EF 45-50%    Allergies  Allergen Reactions  . Altace [Ramipril] Cough  . Crestor [Rosuvastatin Calcium] Rash  . Penicillins Rash and Other (See Comments)    Has patient had a PCN reaction causing immediate rash, facial/tongue/throat swelling, SOB or lightheadedness with hypotension: No Has patient had a PCN reaction causing severe rash involving mucus membranes or skin necrosis: Yes Has patient had a PCN reaction that required hospitalization Yes Has patient had a PCN reaction occurring within the last 10 years: No If all of the above answers are "NO", then may proceed with Cephalosporin use.   . Sulfa Antibiotics Rash    Allergies as of 04/23/2016      Reactions   Altace [ramipril] Cough   Crestor [rosuvastatin Calcium] Rash   Penicillins Rash, Other (See Comments)   Has patient had a PCN reaction causing immediate rash, facial/tongue/throat swelling, SOB or lightheadedness with hypotension: No Has  patient had a PCN reaction causing severe rash involving mucus membranes or skin necrosis: Yes Has patient had a PCN reaction that required hospitalization Yes Has patient had a PCN reaction occurring within the last 10 years: No If all of the above answers are "NO", then may proceed with Cephalosporin use.   Sulfa Antibiotics Rash      Medication List       Accurate as of 04/23/16  3:49 PM. Always use your most recent med list.          acetaminophen 325 MG tablet Commonly known as:  TYLENOL Take by mouth. Take 2 tablets every four hours for fever greater than 100.3 x 24 hours   amiodarone 200 MG tablet Commonly known as:  PACERONE Take 400 mg by mouth daily.   aspirin EC 81 MG tablet Take 1 tablet (81 mg total) by mouth daily.  clobetasol 0.05 % external solution Commonly known as:  TEMOVATE Apply 1 application topically. Apply one application topically as needed for itchy scalp   clonazePAM 0.5 MG tablet Commonly known as:  KLONOPIN Take 0.5 mg by mouth 3 (three) times daily as needed for anxiety.   clopidogrel 75 MG tablet Commonly known as:  PLAVIX Take 1 tablet (75 mg total) by mouth daily.   Dextromethorphan-Guaifenesin 10-100 MG/5ML liquid Take by mouth. 10 ml every 6 hours as needed for cough for 48 hours   fludrocortisone 0.1 MG tablet Commonly known as:  FLORINEF One daily to help supporrt BP   levothyroxine 137 MCG tablet Commonly known as:  SYNTHROID, LEVOTHROID Take 1 tablet (137 mcg total) by mouth daily before breakfast.   lubiprostone 24 MCG capsule Commonly known as:  AMITIZA Take 24 mcg by mouth at bedtime.   meloxicam 7.5 MG tablet Commonly known as:  MOBIC Take 7.5 mg by mouth. Take one tablet daily   midodrine 5 MG tablet Commonly known as:  PROAMATINE Take 5 mg by mouth. Take one tablet twice a day (12:00 and 4:00)   pantoprazole 40 MG tablet Commonly known as:  PROTONIX Take 40 mg by mouth at bedtime.   polyethylene glycol  packet Commonly known as:  MIRALAX / GLYCOLAX Take 17 g by mouth daily as needed for mild constipation.   pravastatin 40 MG tablet Commonly known as:  PRAVACHOL Take 1 tablet by mouth daily.   rOPINIRole 0.5 MG tablet Commonly known as:  REQUIP Take 0.5 mg by mouth 3 (three) times daily as needed (for restless leg syndrome).   senna-docusate 8.6-50 MG tablet Commonly known as:  Senokot-S Take 4 tablets by mouth daily as needed for mild constipation.   tamsulosin 0.4 MG Caps capsule Commonly known as:  FLOMAX Take 0.4 mg by mouth at bedtime.   torsemide 20 MG tablet Commonly known as:  DEMADEX Take 20 mg by mouth daily as needed (for weight gain of 3 or more lbs).   traZODone 50 MG tablet Commonly known as:  DESYREL Take 50 mg by mouth at bedtime.   triamcinolone cream 0.1 % Commonly known as:  KENALOG Apply 1 application topically 2 (two) times daily as needed (for irritation).   Vitamin D3 5000 units Caps Take 5,000 Units by mouth every Monday.       Review of Systems  Constitutional: Positive for appetite change and fatigue. Negative for activity change, fever and unexpected weight change.  HENT: Positive for hearing loss and tinnitus. Negative for congestion, ear pain, rhinorrhea, sore throat, trouble swallowing and voice change.   Eyes: Negative for photophobia and pain.       Corrective lenses  Respiratory: Positive for cough. Negative for choking, chest tightness, shortness of breath and wheezing.   Cardiovascular: Positive for leg swelling (2+ and usingcompression hosiery). Negative for chest pain and palpitations.       Congestive cardiomyopathy. Coronary artery disease, status post CABG. Cardiac arrhythmias, status post pacemaker implanted left upper chest.  Gastrointestinal: Positive for constipation. Negative for abdominal distention, abdominal pain, diarrhea, nausea and vomiting.  Endocrine: Negative for cold intolerance, heat intolerance, polydipsia,  polyphagia and polyuria.  Genitourinary: Positive for enuresis and frequency. Negative for dysuria, hematuria, testicular pain and urgency.       Double voids. Urgency. Foley  Musculoskeletal: Positive for gait problem (using rolling walker). Negative for arthralgias, back pain, myalgias and neck pain.       Ambulates with walker.   Skin:  Negative for color change, pallor, rash (both arms. Seborrhea capitis.) and wound (improvement in left 2nd toe medial side ulcereation and swelling and erythema of the entire toe.).       The left 2nd toe healed. Buttock wound is healed.   Allergic/Immunologic: Negative.   Neurological: Positive for dizziness, speech difficulty (sometimes has difficulty with word finding) and weakness. Negative for tremors, syncope, numbness and headaches.       MRI brain disclosed cerebral atrophy and microvascular disease.  Insomnia and restless legs are improved. Patient reports that he has H/O dizziness. This makes the patient feel unbalanced. R hand numbness.  Cognitive decline since 2014. Restless legs day and night.  Hematological: Negative for adenopathy. Does not bruise/bleed easily.  Psychiatric/Behavioral: Positive for sleep disturbance (nocturia x 7). Negative for behavioral problems, confusion, decreased concentration, hallucinations and suicidal ideas. The patient is not nervous/anxious.        Not enough sleep, decreased energy     Immunization History  Administered Date(s) Administered  . Influenza-Unspecified 12/05/2013, 11/05/2014, 11/16/2015  . PPD Test 03/07/2014  . Pneumococcal-Unspecified 10/05/2009   Pertinent  Health Maintenance Due  Topic Date Due  . PNA vac Low Risk Adult (2 of 2 - PCV13) 10/06/2010  . DEXA SCAN  12/14/2024  . INFLUENZA VACCINE  Completed   Fall Risk  10/30/2015 09/05/2015 02/02/2015  Falls in the past year? No No No  Risk for fall due to : Impaired balance/gait - -   Functional Status Survey:    Vitals:   04/23/16 1352   BP: (!) 90/52  Pulse: 72  Resp: 20  Temp: 97.1 F (36.2 C)  SpO2: 96%  Weight: 174 lb 12.8 oz (79.3 kg)  Height: 5\' 11"  (1.803 m)   Body mass index is 24.38 kg/m. Physical Exam  Constitutional: He is oriented to person, place, and time. He appears well-developed and well-nourished. No distress.  Frail.  HENT:  Head: Normocephalic and atraumatic.  Right Ear: External ear normal.  Left Ear: External ear normal.  Nose: Nose normal.  Mouth/Throat: Oropharynx is clear and moist. No oropharyngeal exudate.  Eyes: Conjunctivae and EOM are normal. Pupils are equal, round, and reactive to light. Right eye exhibits no discharge. Left eye exhibits no discharge.  Neck: Normal range of motion. Neck supple. No JVD present. No tracheal deviation present. No thyromegaly present.  Cardiovascular: Normal rate, regular rhythm and intact distal pulses.  Exam reveals no gallop and no friction rub.   Murmur heard. 7-1/0 systolic  Pulmonary/Chest: Effort normal and breath sounds normal. No respiratory distress. He has no wheezes. He has no rales. He exhibits no tenderness.  Abdominal: He exhibits no distension and no mass. There is no tenderness.  abd hernia  Genitourinary: No penile tenderness.  Genitourinary Comments: Foley   Musculoskeletal: Normal range of motion. He exhibits edema (trace to 1+ bipedal). He exhibits no tenderness.  Gait disturbance and loss of balance. Patient arrives in a  wheelchair today.  Lymphadenopathy:    He has no cervical adenopathy.  Neurological: He is alert and oriented to person, place, and time. He has normal reflexes. No cranial nerve deficit. He exhibits normal muscle tone. Coordination normal.  Skin: Skin is warm and dry. No rash noted. No erythema. No pallor.  Seborrhea of the scalp.   Psychiatric: He has a normal mood and affect. His behavior is normal. Judgment and thought content normal.    Labs reviewed:  Recent Labs  01/11/16 0537 03/10/16 1443  03/11/16 0436  04/03/16  NA 140 140 139 138  K 4.1 3.5 3.7 4.6  CL 106 104 108  --   CO2 27 29 25   --   GLUCOSE 97 96 105*  --   BUN 13 22* 18 15  CREATININE 0.79 1.03 0.72 0.7  CALCIUM 8.8* 8.7* 8.0*  --     Recent Labs  01/10/16 1400 03/10/16 1443 03/11/16 0436 04/03/16  AST 25 53* 46* 15  ALT 22 26 23 14   ALKPHOS 68 61 51 60  BILITOT 0.4 0.6 0.4  --   PROT 6.4* 6.9 5.8*  --   ALBUMIN 3.5 3.7 3.1*  --     Recent Labs  12/06/15 1617 01/10/16 1400  01/11/16 0537 03/10/16 1443 03/11/16 0436 04/03/16  WBC 5.4 6.0  --  6.4 8.3 6.3 5.6  NEUTROABS 3.3 4.1  --   --  5.8  --   --   HGB 13.5 14.1  < > 13.9 13.1 12.0* 13.5  HCT 41.4 42.7  < > 42.2 39.5 36.9* 40*  MCV 98.6 97.7  --  97.2 95.6 95.8  --   PLT 183 190  --  188 190 176 225  < > = values in this interval not displayed. Lab Results  Component Value Date   TSH 2.58 11/27/2015   Lab Results  Component Value Date   HGBA1C 5.6 12/07/2015   Lab Results  Component Value Date   CHOL 84 12/07/2015   HDL 41 12/07/2015   LDLCALC 23 12/07/2015   TRIG 102 12/07/2015   CHOLHDL 2.0 12/07/2015    Significant Diagnostic Results in last 30 days:  No results found.  Assessment/Plan Orthostatic hypotension 04/04/16 Dr Sallyanne Kuster Midodrine 10mg  8am, 5mg  12N, 5mg  4pm  Osteoarthritis Multiple sits, stable, will change Meloxicam to prn, continue prn Tylenol, observe.   Depression with anxiety Stable, continue Trazodone 50mg  hs, prn Clonazepam 0.5mg  tid used 4x since 2/518, effective, no AR observed, will re-evaluate in a month.   Hypertension Controlled, prn Torsemide available for edema and HTN  Combined congestive systolic and diastolic heart failure ([place]) Compensated clinically, chronic BLE edema, continue Torsemide prn, BNP 96.8 02/21/15. 04/06/15 Na 142, K 4.6, Bun 16, creat 0.89, 04/20/14 BNP 111.7, Na 142, K 4.6, Bun 16, creat 0.9    VT (ventricular tachycardia) ([facility]) Hx of VT, taking Amiodarone 400mg   daily  TIA (transient ischemic attack) Continue Plavix, ASA, statin  GERD (gastroesophageal reflux disease) Stable, continue Protonix daily.   Constipation Managed with daily Amitiza 32mcg qhs, prn MiraLax, prn Senna S 03/29/16 KUB no bowel obstruction,    Hypothyroidism Taking Levothyroxine 116mcg, last TSH 2.58 11/27/15  Urinary retention 04/04/16 Urology, hx of hematuria. Taking Tamsulosin.   RLS (restless legs syndrome) Prn Requip and Clonazepam available to him  Insomnia Taking Trazodone 50mg  at night.      Family/ staff Communication: IL when able.   Labs/tests ordered:  none

## 2016-04-23 NOTE — Assessment & Plan Note (Signed)
Hx of VT, taking Amiodarone 400mg  daily

## 2016-04-23 NOTE — Assessment & Plan Note (Signed)
Prn Requip and Clonazepam available to him

## 2016-04-23 NOTE — Assessment & Plan Note (Signed)
Continue Plavix, ASA, statin

## 2016-04-23 NOTE — Assessment & Plan Note (Signed)
Multiple sits, stable, will change Meloxicam to prn, continue prn Tylenol, observe.

## 2016-04-23 NOTE — Assessment & Plan Note (Signed)
Controlled, prn Torsemide available for edema and HTN

## 2016-04-23 NOTE — Telephone Encounter (Signed)
New  Message  Pts wife voiced wanting to speak with nurse to make sure MD here is being notified about changes within pts meds and chart from [facility].  Please f/u

## 2016-04-23 NOTE — Assessment & Plan Note (Signed)
Taking Trazodone 50mg  at night.

## 2016-04-23 NOTE — Assessment & Plan Note (Signed)
04/04/16 Dr Croitoru Midodrine 10mg  8am, 5mg  12N, 5mg  4pm

## 2016-04-23 NOTE — Assessment & Plan Note (Signed)
04/04/16 Urology, hx of hematuria. Taking Tamsulosin.

## 2016-04-24 NOTE — Telephone Encounter (Signed)
[place] [phone] orders given to [place], LPN

## 2016-04-24 NOTE — Telephone Encounter (Signed)
Please get most recent Med list from [facility]

## 2016-04-24 NOTE — Telephone Encounter (Signed)
Received fax from [facility] that Dr. Nyoka Cowden put patient on Florinef 0.1mg  daily r/t BP on the 19th   Lying 118/72 Standing 86/56 w/complaints of dizziness  This AM (3/22) BP lying was 122/78  Requested that med list be faxed to our office for MD review

## 2016-04-24 NOTE — Telephone Encounter (Signed)
Please tell them to monitor weight daily. He may develop CHF esacerbation w Florinef. To call us for weight gain > 2 lb in a 24h period and for >5 lb gain over today's weight, regardless of how quickly it occurs.

## 2016-04-30 ENCOUNTER — Encounter: Payer: Self-pay | Admitting: Neurology

## 2016-04-30 ENCOUNTER — Ambulatory Visit: Payer: Medicare Other | Admitting: Neurology

## 2016-04-30 ENCOUNTER — Ambulatory Visit (INDEPENDENT_AMBULATORY_CARE_PROVIDER_SITE_OTHER): Payer: Medicare Other | Admitting: Neurology

## 2016-04-30 VITALS — BP 103/62 | HR 65

## 2016-04-30 DIAGNOSIS — G459 Transient cerebral ischemic attack, unspecified: Secondary | ICD-10-CM | POA: Diagnosis not present

## 2016-04-30 DIAGNOSIS — R42 Dizziness and giddiness: Secondary | ICD-10-CM

## 2016-04-30 DIAGNOSIS — M6281 Muscle weakness (generalized): Secondary | ICD-10-CM

## 2016-04-30 DIAGNOSIS — R5381 Other malaise: Secondary | ICD-10-CM | POA: Diagnosis not present

## 2016-04-30 NOTE — Progress Notes (Signed)
Subjective:    Patient ID: Adam [name] is a 83 y.o. male.  HPI     Interim history:   Adam [name] is a 83 year old right-handed gentleman with an underlying complex medical history of hypothyroidism, melanoma in 1976, heart disease, status post CABG in 1996, 4 vessels, and pacemaker placement secondary to bradycardia in 2010, hypertension, hyperlipidemia, reflux d/s, insomnia, overweight state and restless leg symptoms, who presents for follow-up consultation of his dizziness and symptoms of off balance. The patient is accompanied by his wife and daughter today. I last saw him on 10/30/2015, at which time he reported doing okay, some swelling both legs, from knees down, was using compression stockings. We talked about gait safety at the time and he was advised to use his walker at all times.  Today, 04/30/2016 (all dictated new, as well as above notes, some dictation done in note pad or Word, outside of chart, may appear as copied):  He reports doing okay, good day today. Has had several hospitalizations, one in December d/t weakness on the R, had CTH which was neg for acute stroke, Had stroke workup but could not have an MRI due to having a pacemaker. He was started on Eliquis, which was then stopped, due to no evidence of A. fib, he is now maintained on Plavix. He is also on cannot have MRI d/t PM. He has had OH, now on midodrine and florinef. He has lost weight, has been weaker, barely able to walk with walker. He has been on Plavix. Has has some memory loss. Still dizzy.  He was hospitalized in February 2018 for suspected flu. He is an inpatient rehabilitation.   The patient's allergies, current medications, family history, past medical history, past social history, past surgical history and problem list were reviewed and updated as appropriate.    Previously (copied from previous notes for reference):     I saw him on 02/22/2015, at which time he reported doing fairly well. He was in assisted  living. He was at [facility] for about a week for rehabilitation. He had adjusted fairly well. He reported no recent falls but had a near fall according to his wife when he started a new bladder medication, this was stopped after 2 days. He was using his 2 wheeled walker. Trazodone was 25 mg each night, insomnia was still a problem however. He was on Requip 0.5 mg each night and did not start gabapentin. His constipation was better. Of note, he presented to the ED with severe constipation, but had a BM there and improved. I reviewed the ED notes from 12/06/14. He again presented to the emergency room on 12/29/2014 with a one-week history of constipation. This required disimpaction in the emergency department. He was admitted to the hospital secondary to urinary retention and discharged on 01/01/2015. I reviewed the hospital records including discharge summary. I suggested he taper of trazodone completely. I suggested he try melatonin at night for sleep. I asked him to use his walker at all times and change positions slowly.   He missed his appointment on 02/21/15. I first met him on 11/22/2014 at the request of his primary care physician, at which time the patient reported a several year history of intermittent dizziness, worse within the previous 4 months. He also reported dream enactments. I suggested he reduce his trazodone and taper of ropinirole. He reported symptoms of restless leg syndrome. I suggested he try low-dose gabapentin.   11/22/2014: He reports problems with dizziness and his balance.  This has been ongoing for years, worse in the last 4 months. He has difficulty sleeping. He has restless leg symptoms. He has been on ropinirole low dose for the past 6 months or so. This was recently increased to 0.5 mg strength 1-2 pills at night. He has been taking 1-1/2 pills for the past couple of weeks. He has been on trazodone for the past month. He has a fever year history of dream activity including  yelling out in his sleep and he has even rolled out of bed and hit his head against the night table before. Trazodone helps him sleep but his wife feels that his yelling out episodes have become a little bit more frequent since he has been on trazodone. This is 50 mg each night and he has been on it for a month or so. He is not sure if he has had his vitamin B12 level and vitamin D levels checked recently. He is due for a TSH next month through your office. I reviewed your office note from 11/11/2014 which you kindly included. He sees Dr. Melvyn Novas in pulmonology and had a recent CT sinuses which was negative for any acute sinusitis. He sees cardiology, Dr. Marland KitchenC". He had a head CT without contrast on 12/10/2013 which showed atrophy and small vessel disease. I personally reviewed the images through the PACS system and also shared some pictures with the patient and his family. He had no significant white matter changes but atrophy may have been a little bit more advanced than expected for age in my opinion. He reports a sensation of lightheadedness and insecurity when standing up. He feels like he might fall and has had some near falls but thankfully no actual falls. He denies any spinning sensation but sometimes he feels like something is spinning in his head. He denies any headaches. He does not snore or have any significant issues with breathing at night. In the past he has tried melatonin for sleep which was marginally helpful. He has had occasional tingling in his fingertips. He is not diabetic. He does not always drink enough water, his family feels. Of note, he has been on amiodarone. He has had lower extremity swelling for the past several years, essentially since his open heart surgery but in the past 6 months his swelling has become significantly worse per wife.  His Past Medical History Is Significant For: Past Medical History:  Diagnosis Date  . Arthritis    "minor, back and sometimes knees"  (12/15/2012)  . Bradycardia    AFib/SSS s/p St Jude PPM 04/12/2008  . CAD (coronary artery disease) 12/30/2014   CABG (LIMA-LAD, SVG-RCA, SVG-OM in 1996).  07/2009 BMS to SVG-RCA. Cath in 04/2010 with patent stents   . Cardiomyopathy, ischemic 08/25/2012  . CHF (congestive heart failure) ([place])   . Chronic knee pain 12/03/2014  . Combined congestive systolic and diastolic heart failure ([place]) 02/02/2015   Hx EF 41%. BNP 96.8 02/21/15 Torsemide 04/06/15 Na 142, K 4.6, Bun 16, creat 0.89 04/20/14 BNP 111.7, Na 142, K 4.6, Bun 16, creat 0.9   . Depression with anxiety 02/02/2015   02/21/15 Hgb A1c 5.8 03/10/15 MMSE 30/30   . Dizziness, after diuretic asscoiated with hypotension and responded to fluid bolus 06/05/2011   04/28/15 US carotid R+L normal bilateral arterial velocities.    Marland Kitchen Dyspnea 08/11/2014   Followed in Pulmonary clinic/ [facility]/ [place]  - 08/11/2014  Walked RA x 1 laps @ 185 ft each stopped due to fatigue/off  balance/ slow pace/  no sob or desat  - PFT's  09/26/2014  FEV1 2.26 (85 % ) ratio 76  p no % improvement from saba with DLCO  67 % corrects to 93 % for alv volume      Since prev study 08/04/13 minimal change lung vol or dlco    . Embolic cerebral infarction ([place]) 12/06/2015  . Exertional shortness of breath    "sometimes walking" (12/15/2012)  . GERD (gastroesophageal reflux disease)   . Gout 02/09/2015  . Heart murmur    "just told I had one today" (12/15/2012)  . Hiatal hernia   . Hyperlipidemia   . Hypertension   . Hypothyroid   . Influenza A 03/10/2016  . Insomnia   . Melanoma of back ([place]) 1976  . Myocardial infarction 1996; 2011   "both silent" (12/15/2012)  . Nonrheumatic aortic valve stenosis   . Orthostatic hypotension   . Osteoporosis, senile   . Pacemaker   . RBBB   . Restless leg 02/02/2015  . Right leg weakness 12/06/2015  . [facility] spotted fever   . S/P CABG x 4   . Sick sinus syndrome ([place]) 01/31/2014  . Sustained ventricular tachycardia ([place])  07/27/2014  . Urinary retention 12/30/2014    His Past Surgical History Is Significant For: Past Surgical History:  Procedure Laterality Date  . CARDIAC CATHETERIZATION  04/2010   LIMA to LAD patent,SVG to OM patent,no in-stnet restenosis RCA  . CATARACT EXTRACTION W/ INTRAOCULAR LENS  IMPLANT, BILATERAL Bilateral 2012  . CORONARY ANGIOPLASTY WITH STENT PLACEMENT  07/2009   bare metal stent to SVG to the RCA  . CORONARY ARTERY BYPASS GRAFT  1996   LIMA to LAD,SVG to RCA & SVG to OM  . INSERT / REPLACE / REMOVE PACEMAKER  2010  . MELANOMA EXCISION  05/1974 X2   "taken off my back" (12/15/2012)  . NM MYOVIEW LTD  06/2011   low risk  . TONSILLECTOMY  1938  . TRANSURETHRAL RESECTION OF PROSTATE  1986  . US ECHOCARDIOGRAPHY  07/11/2009   EF 45-50%    His Family History Is Significant For: Family History  Problem Relation Age of Onset  . Coronary artery disease Mother   . Diabetes Mother   . Heart disease Mother   . Coronary artery disease Father   . Diabetes Father   . Lung cancer Father   . Heart disease Father   . Anuerysm Son   . Heart disease Brother     His Social History Is Significant For: Social History   Social History  . Marital status: Married    Spouse name: N/A  . Number of children: 2  . Years of education: Masters   Occupational History  . Retired Company secretary -Pensions consultant    Social History Main Topics  . Smoking status: Never Smoker  . Smokeless tobacco: Never Used  . Alcohol use No  . Drug use: No  . Sexual activity: No   Other Topics Concern  . None   Social History Narrative   Lives at [place] to [place] 01/09/15   Married - Violet   Never smoked   Alcohol none   Previously employed as Scientist, forensic for [employer].         Diet:Low sodium   Do you drink/eat things with caffeine? No   Marital status: Married  What year were you married?1950   Do you live in a house, apartment, assisted  living, condo, trailer, etc)?    Is it one or more stories? 1   How many persons live in your home? 2   Do you have any pets in your home? No   Current or past profession: Minister, [employer]   Do you exercise?     Very Little                                                 Type & how often:    Do you have a living will?  Yes   Do you have a DNR Form? Yes   Do you have a POA/HPOA forms? Yes    His Allergies Are:  Allergies  Allergen Reactions  . Altace [Ramipril] Cough  . Crestor [Rosuvastatin Calcium] Rash  . Penicillins Rash and Other (See Comments)    Has patient had a PCN reaction causing immediate rash, facial/tongue/throat swelling, SOB or lightheadedness with hypotension: No Has patient had a PCN reaction causing severe rash involving mucus membranes or skin necrosis: Yes Has patient had a PCN reaction that required hospitalization Yes Has patient had a PCN reaction occurring within the last 10 years: No If all of the above answers are "NO", then may proceed with Cephalosporin use.   . Sulfa Antibiotics Rash  :   His Current Medications Are:  Outpatient Encounter Prescriptions as of 04/30/2016  Medication Sig  . acetaminophen (TYLENOL) 325 MG tablet Take by mouth. Take 2 tablets every four hours for fever greater than 100.3 x 24 hours  . amiodarone (PACERONE) 200 MG tablet Take 400 mg by mouth daily.  Marland Kitchen aspirin EC 81 MG tablet Take 1 tablet (81 mg total) by mouth daily.  . Cholecalciferol (VITAMIN D3) 5000 UNITS CAPS Take 5,000 Units by mouth every Monday.   . clobetasol (TEMOVATE) 0.05 % external solution Apply 1 application topically. Apply one application topically as needed for itchy scalp  . clonazePAM (KLONOPIN) 0.5 MG tablet Take 0.5 mg by mouth 3 (three) times daily as needed for anxiety.  . clopidogrel (PLAVIX) 75 MG tablet Take 1 tablet (75 mg total) by mouth daily.  . fludrocortisone (FLORINEF) 0.1 MG tablet One daily to help supporrt BP  .  levothyroxine (SYNTHROID, LEVOTHROID) 137 MCG tablet Take 1 tablet (137 mcg total) by mouth daily before breakfast.  . lubiprostone (AMITIZA) 24 MCG capsule Take 24 mcg by mouth at bedtime.   . meloxicam (MOBIC) 7.5 MG tablet Take 7.5 mg by mouth. Take one tablet daily  . midodrine (PROAMATINE) 5 MG tablet Take 5 mg by mouth 3 (three) times daily with meals. Take one tablet twice a day (12:00 and 4:00)  . pantoprazole (PROTONIX) 40 MG tablet Take 40 mg by mouth at bedtime.  . polyethylene glycol (MIRALAX / GLYCOLAX) packet Take 17 g by mouth daily as needed for mild constipation.  . pravastatin (PRAVACHOL) 40 MG tablet Take 1 tablet by mouth daily.  Marland Kitchen rOPINIRole (REQUIP) 0.5 MG tablet Take 0.5 mg by mouth 3 (three) times daily as needed (for restless leg syndrome).  Marland Kitchen senna-docusate (SENOKOT-S) 8.6-50 MG tablet Take 4 tablets by mouth daily as needed for mild constipation.  . tamsulosin (FLOMAX) 0.4 MG CAPS capsule Take 0.4 mg by mouth at bedtime.   Marland Kitchen  torsemide (DEMADEX) 20 MG tablet Take 20 mg by mouth daily as needed (for weight gain of 3 or more lbs).   . traZODone (DESYREL) 50 MG tablet Take 50 mg by mouth at bedtime.  . triamcinolone cream (KENALOG) 0.1 % Apply 1 application topically 2 (two) times daily as needed (for irritation).  . [DISCONTINUED] Dextromethorphan-Guaifenesin 10-100 MG/5ML liquid Take by mouth. 10 ml every 6 hours as needed for cough for 48 hours   No facility-administered encounter medications on file as of 04/30/2016.   :  Review of Systems:  Out of a complete 14 point review of systems, all are reviewed and negative with the exception of these symptoms as listed below: Review of Systems  Neurological: Positive for dizziness.       Pt presents today to discuss his dizziness. Pt reports that his dizziness is better controlled at time.    Objective:  Neurologic Exam  Physical Exam Physical Examination:   Vitals:   04/30/16 1518  BP: 103/62  Pulse: 65     General Examination: The patient is a very pleasant 83 y.o. male in no acute distress. He appears frail and deconditioned. He is in a wheelchair. He has lost weight.  HEENT: Normocephalic, atraumatic, pupils are equal, round and reactive to light and accommodation. Extraocular tracking is fairly well preserved. Hearing is mildly impaired. Speech shows no dysarthria, mild hypophonia. Airway examination reveals mild mouth dryness, otherwise no changes in airway finding, tongue protrudes centrally and palate elevates symmetrically, no facial weakness is noted.  Chest: Clear to auscultation without wheezing, rhonchi or crackles noted.  Heart: S1+S2+0, regular with mild systolic murmur noted.   Abdomen: Soft, non-tender and non-distended with normal bowel sounds appreciated on auscultation.  Extremities: There is 1+ pitting edema in the distal lower extremities, around the ankles bilaterally.   Skin: Warm and dry But bruising noted throughout both arms and hands.   Musculoskeletal: exam reveals no obvious joint deformities, tenderness or joint swelling or erythema.   Neurologically:  Mental status: The patient is awake, alert and oriented, his memory, attention, language and fund of knowledge are fairly appropriate. He is somewhat slow in responding at times. He does lose history. speech shows no dysarthria, mild hypophonia is noted.  Cranial nerves II - XII are as described above under HEENT exam.  Motor exam:  Thin bulk, global strength of 4 out of 5, reflexes 1+ throughout. Fine motor skills are globally mildly to moderately impaired. He is situated in the wheelchair, Romberg testing is.possible safely, he is unable to stand or walk for me. Sensory exam: intact to light touch.   Assessment and Plan:  In summary, Adam [name] is a very pleasant 83 y.o.-year old male with an underlying complex medical history of hypothyroidism, melanoma in 1976, heart disease, status post CABG in 1996, s/p  PM placement secondary to bradycardia in 2010, hypertension, orthostatic hypotension, hyperlipidemia, reflux disease, insomnia, overweight state, RLS and recent hospitalizations for weakness, concerning for TIA, then again more recent hospitalization in February of this year for suspected flu, and restless leg symptoms, who presents for follow-up consultation, I have followed him intermittently for his history of dizziness and balance problems which I felt were multifactorial in nature. His situation has with time become more complicated, what with his recent weakness and acute illness. He has had multiple tests but an actual acute stroke was not proven, and he cannot have an MRI. He may have had a TIA, he certainly has multiple vascular  risk factors. He has had overall deconditioning and muscular weakness which appears to be global at this time. He is on 2 medications for his orthostatic hypotension. I think supportive care and ongoing symptomatic management of his different medical problems will continue to be key. Specifically from the neurological standpoint, I do not have any other additional recommendations. I did ask them to try to make sure he has good nutrition including perhaps protein milkshake supplementation, good hydration, strengthening exercises, fall risk management. I'm not sure that he will get to a point where he can go back to independent living with his wife. He and his family are encouraged to talk about longer-term living situation. I would like to see him back for recheck in 3-4 months, sooner as needed. I answered all their questions today and the patient and his wife and daughter were in agreement. I spent 30 minutes in total face-to-face time with the patient, more than 50% of which was spent in counseling and coordination of care, reviewing test results, reviewing medication and discussing or reviewing the diagnosis of weakness, dizziness, deconditioning, TIA, the prognosis and  treatment options. Pertinent laboratory and imaging test results that were available during this visit with the patient were reviewed by me and considered in my medical decision making (see chart for details).

## 2016-04-30 NOTE — Patient Instructions (Addendum)
Good nutrition, good hydration, working on strengthening exercises, maintaining BP.  Please talk to your PCP and family about longer term living situation.  I am not sure, you can go back to independent living.

## 2016-05-07 ENCOUNTER — Encounter: Payer: Self-pay | Admitting: Nurse Practitioner

## 2016-05-07 ENCOUNTER — Non-Acute Institutional Stay (SKILLED_NURSING_FACILITY): Payer: Medicare Other | Admitting: Nurse Practitioner

## 2016-05-07 DIAGNOSIS — I10 Essential (primary) hypertension: Secondary | ICD-10-CM

## 2016-05-07 DIAGNOSIS — K2100 Gastro-esophageal reflux disease with esophagitis, without bleeding: Secondary | ICD-10-CM

## 2016-05-07 DIAGNOSIS — G4700 Insomnia, unspecified: Secondary | ICD-10-CM

## 2016-05-07 DIAGNOSIS — R531 Weakness: Secondary | ICD-10-CM | POA: Diagnosis not present

## 2016-05-07 DIAGNOSIS — I5042 Chronic combined systolic (congestive) and diastolic (congestive) heart failure: Secondary | ICD-10-CM

## 2016-05-07 DIAGNOSIS — I472 Ventricular tachycardia, unspecified: Secondary | ICD-10-CM

## 2016-05-07 DIAGNOSIS — E039 Hypothyroidism, unspecified: Secondary | ICD-10-CM

## 2016-05-07 DIAGNOSIS — G2581 Restless legs syndrome: Secondary | ICD-10-CM | POA: Diagnosis not present

## 2016-05-07 DIAGNOSIS — R339 Retention of urine, unspecified: Secondary | ICD-10-CM | POA: Diagnosis not present

## 2016-05-07 DIAGNOSIS — K5900 Constipation, unspecified: Secondary | ICD-10-CM

## 2016-05-07 DIAGNOSIS — F418 Other specified anxiety disorders: Secondary | ICD-10-CM

## 2016-05-07 DIAGNOSIS — I951 Orthostatic hypotension: Secondary | ICD-10-CM | POA: Diagnosis not present

## 2016-05-07 NOTE — Progress Notes (Signed)
Location:  [facility] Room Number: 35 Place of Service:  SNF (31) Provider:  Mast, Manxie  NP  Jeanmarie Hubert, MD  Patient Care Team: Estill Dooms, MD as PCP - General (Internal Medicine) [name], MD as Attending Physician (Cardiology) Vevelyn Royals, MD as Consulting Physician (Ophthalmology) Franchot Gallo, MD as Consulting Physician (Urology) Tanda Rockers, MD as Consulting Physician (Pulmonary Disease) Allyn Kenner, MD (Dermatology) Deliah Goody, PA-C as Physician Assistant (Physician Assistant) Man Otho Darner, NP as Nurse Practitioner (Internal Medicine)  Extended Emergency Contact Information Primary Emergency Contact: Dominy,[name] Address: [place], [place] Phone: [phone] Mobile Phone: [phone] Relation: Spouse Secondary Emergency Contact: [name],Dom  [place] Phone: [phone] Mobile Phone: [phone] Relation: Daughter  Code Status:  DNR Goals of care: Advanced Directive information Advanced Directives 05/07/2016  Does Patient Have a Medical Advance Directive? Yes  Type of [facility] [place];Living will  Does patient want to make changes to medical advance directive? No - Patient declined  Copy of [place] in Chart? Yes  Would patient like information on creating a medical advance directive? -  Pre-existing out of facility DNR order (yellow form or pink MOST form) -     Chief Complaint  Patient presents with  . Acute Visit    not feeling well, lost of appetite, nausea    HPI:  Pt is a 83 y.o. male seen today for an acute visit for    Past Medical History:  Diagnosis Date  . Arthritis    "minor, back and sometimes knees" (12/15/2012)  . Bradycardia    AFib/SSS s/p St Jude PPM 04/12/2008  . CAD (coronary artery disease) 12/30/2014   CABG (LIMA-LAD, SVG-RCA, SVG-OM in 1996).  07/2009 BMS to  SVG-RCA. Cath in 04/2010 with patent stents   . Cardiomyopathy, ischemic 08/25/2012  . CHF (congestive heart failure) ([place])   . Chronic knee pain 12/03/2014  . Combined congestive systolic and diastolic heart failure ([place]) 02/02/2015   Hx EF 41%. BNP 96.8 02/21/15 Torsemide 04/06/15 Na 142, K 4.6, Bun 16, creat 0.89 04/20/14 BNP 111.7, Na 142, K 4.6, Bun 16, creat 0.9   . Depression with anxiety 02/02/2015   02/21/15 Hgb A1c 5.8 03/10/15 MMSE 30/30   . Dizziness, after diuretic asscoiated with hypotension and responded to fluid bolus 06/05/2011   04/28/15 US carotid R+L normal bilateral arterial velocities.    Marland Kitchen Dyspnea 08/11/2014   Followed in Pulmonary clinic/ [facility]/ [place]  - 08/11/2014  Walked RA x 1 laps @ 185 ft each stopped due to fatigue/off balance/ slow pace/  no sob or desat  - PFT's  09/26/2014  FEV1 2.26 (85 % ) ratio 76  p no % improvement from saba with DLCO  67 % corrects to 93 % for alv volume      Since prev study 08/04/13 minimal change lung vol or dlco    . Embolic cerebral infarction ([place]) 12/06/2015  . Exertional shortness of breath    "sometimes walking" (12/15/2012)  . GERD (gastroesophageal reflux disease)   . Gout 02/09/2015  . Heart murmur    "just told I had one today" (12/15/2012)  . Hiatal hernia   . Hyperlipidemia   . Hypertension   . Hypothyroid   . Influenza A 03/10/2016  . Insomnia   . Melanoma of back ([place]) 1976  .  Myocardial infarction 1996; 2011   "both silent" (12/15/2012)  . Nonrheumatic aortic valve stenosis   . Orthostatic hypotension   . Osteoporosis, senile   . Pacemaker   . RBBB   . Restless leg 02/02/2015  . Right leg weakness 12/06/2015  . [facility] spotted fever   . S/P CABG x 4   . Sick sinus syndrome ([place]) 01/31/2014  . Sustained ventricular tachycardia ([place]) 07/27/2014  . Urinary retention 12/30/2014   Past Surgical History:  Procedure Laterality Date  . CARDIAC CATHETERIZATION  04/2010   LIMA to LAD patent,SVG to OM patent,no  in-stnet restenosis RCA  . CATARACT EXTRACTION W/ INTRAOCULAR LENS  IMPLANT, BILATERAL Bilateral 2012  . CORONARY ANGIOPLASTY WITH STENT PLACEMENT  07/2009   bare metal stent to SVG to the RCA  . CORONARY ARTERY BYPASS GRAFT  1996   LIMA to LAD,SVG to RCA & SVG to OM  . INSERT / REPLACE / REMOVE PACEMAKER  2010  . MELANOMA EXCISION  05/1974 X2   "taken off my back" (12/15/2012)  . NM MYOVIEW LTD  06/2011   low risk  . TONSILLECTOMY  1938  . TRANSURETHRAL RESECTION OF PROSTATE  1986  . US ECHOCARDIOGRAPHY  07/11/2009   EF 45-50%    Allergies  Allergen Reactions  . Altace [Ramipril] Cough  . Crestor [Rosuvastatin Calcium] Rash  . Penicillins Rash and Other (See Comments)    Has patient had a PCN reaction causing immediate rash, facial/tongue/throat swelling, SOB or lightheadedness with hypotension: No Has patient had a PCN reaction causing severe rash involving mucus membranes or skin necrosis: Yes Has patient had a PCN reaction that required hospitalization Yes Has patient had a PCN reaction occurring within the last 10 years: No If all of the above answers are "NO", then may proceed with Cephalosporin use.   . Sulfa Antibiotics Rash    Allergies as of 05/07/2016      Reactions   Altace [ramipril] Cough   Crestor [rosuvastatin Calcium] Rash   Penicillins Rash, Other (See Comments)   Has patient had a PCN reaction causing immediate rash, facial/tongue/throat swelling, SOB or lightheadedness with hypotension: No Has patient had a PCN reaction causing severe rash involving mucus membranes or skin necrosis: Yes Has patient had a PCN reaction that required hospitalization Yes Has patient had a PCN reaction occurring within the last 10 years: No If all of the above answers are "NO", then may proceed with Cephalosporin use.   Sulfa Antibiotics Rash      Medication List       Accurate as of 05/07/16  1:21 PM. Always use your most recent med list.          acetaminophen 325 MG  tablet Commonly known as:  TYLENOL Take by mouth. Take 2 tablets every four hours for fever greater than 100.3 x 24 hours   amiodarone 200 MG tablet Commonly known as:  PACERONE Take 400 mg by mouth daily.   aspirin EC 81 MG tablet Take 1 tablet (81 mg total) by mouth daily.   clobetasol 0.05 % external solution Commonly known as:  TEMOVATE Apply 1 application topically. Apply one application topically as needed for itchy scalp   clonazePAM 0.5 MG tablet Commonly known as:  KLONOPIN Take 0.5 mg by mouth 3 (three) times daily as needed for anxiety.   clopidogrel 75 MG tablet Commonly known as:  PLAVIX Take 1 tablet (75 mg total) by mouth daily.   fludrocortisone 0.1 MG tablet Commonly known  as:  FLORINEF One daily to help supporrt BP   levothyroxine 137 MCG tablet Commonly known as:  SYNTHROID, LEVOTHROID Take 1 tablet (137 mcg total) by mouth daily before breakfast.   lubiprostone 24 MCG capsule Commonly known as:  AMITIZA Take 24 mcg by mouth at bedtime.   meloxicam 7.5 MG tablet Commonly known as:  MOBIC Take 7.5 mg by mouth. Take one tablet daily   midodrine 5 MG tablet Commonly known as:  PROAMATINE Take 5 mg by mouth 3 (three) times daily with meals. Take one tablet twice a day (12:00 and 4:00)   pantoprazole 40 MG tablet Commonly known as:  PROTONIX Take 40 mg by mouth at bedtime.   polyethylene glycol packet Commonly known as:  MIRALAX / GLYCOLAX Take 17 g by mouth daily as needed for mild constipation.   pravastatin 40 MG tablet Commonly known as:  PRAVACHOL Take 1 tablet by mouth daily.   rOPINIRole 0.5 MG tablet Commonly known as:  REQUIP Take 0.5 mg by mouth 3 (three) times daily as needed (for restless leg syndrome).   senna-docusate 8.6-50 MG tablet Commonly known as:  Senokot-S Take 4 tablets by mouth daily as needed for mild constipation.   tamsulosin 0.4 MG Caps capsule Commonly known as:  FLOMAX Take 0.4 mg by mouth at bedtime.    torsemide 20 MG tablet Commonly known as:  DEMADEX Take 20 mg by mouth daily as needed (for weight gain of 3 or more lbs).   traZODone 50 MG tablet Commonly known as:  DESYREL Take 50 mg by mouth at bedtime.   triamcinolone cream 0.1 % Commonly known as:  KENALOG Apply 1 application topically 2 (two) times daily as needed (for irritation).   Vitamin D3 5000 units Caps Take 5,000 Units by mouth every Monday.       Review of Systems  Immunization History  Administered Date(s) Administered  . Influenza-Unspecified 12/05/2013, 11/05/2014, 11/16/2015  . PPD Test 03/07/2014  . Pneumococcal-Unspecified 10/05/2009   Pertinent  Health Maintenance Due  Topic Date Due  . PNA vac Low Risk Adult (2 of 2 - PCV13) 10/06/2010  . INFLUENZA VACCINE  09/04/2016  . DEXA SCAN  12/14/2024   Fall Risk  10/30/2015 09/05/2015 02/02/2015  Falls in the past year? No No No  Risk for fall due to : Impaired balance/gait - -   Functional Status Survey:    Vitals:   05/07/16 1249  BP: 113/65  Pulse: 70  Resp: 18  Temp: 97.6 F (36.4 C)  SpO2: 95%  Weight: 172 lb (78 kg)  Height: 5\' 11"  (1.803 m)   Body mass index is 23.99 kg/m. Physical Exam  Labs reviewed:  Recent Labs  01/11/16 0537 03/10/16 1443 03/11/16 0436 04/03/16  NA 140 140 139 138  K 4.1 3.5 3.7 4.6  CL 106 104 108  --   CO2 27 29 25   --   GLUCOSE 97 96 105*  --   BUN 13 22* 18 15  CREATININE 0.79 1.03 0.72 0.7  CALCIUM 8.8* 8.7* 8.0*  --     Recent Labs  01/10/16 1400 03/10/16 1443 03/11/16 0436 04/03/16  AST 25 53* 46* 15  ALT 22 26 23 14   ALKPHOS 68 61 51 60  BILITOT 0.4 0.6 0.4  --   PROT 6.4* 6.9 5.8*  --   ALBUMIN 3.5 3.7 3.1*  --     Recent Labs  12/06/15 1617 01/10/16 1400  01/11/16 0537 03/10/16 1443 03/11/16 0436 04/03/16  WBC 5.4 6.0  --  6.4 8.3 6.3 5.6  NEUTROABS 3.3 4.1  --   --  5.8  --   --   HGB 13.5 14.1  < > 13.9 13.1 12.0* 13.5  HCT 41.4 42.7  < > 42.2 39.5 36.9* 40*  MCV  98.6 97.7  --  97.2 95.6 95.8  --   PLT 183 190  --  188 190 176 225  < > = values in this interval not displayed. Lab Results  Component Value Date   TSH 2.58 11/27/2015   Lab Results  Component Value Date   HGBA1C 5.6 12/07/2015   Lab Results  Component Value Date   CHOL 84 12/07/2015   HDL 41 12/07/2015   LDLCALC 23 12/07/2015   TRIG 102 12/07/2015   CHOLHDL 2.0 12/07/2015    Significant Diagnostic Results in last 30 days:  No results found.  Assessment/Plan 1. Orthostatic hypotension   2. Depression with anxiety   3. Weak   4. Insomnia, unspecified type   5. RLS (restless legs syndrome)   6. Urinary retention   7. Hypothyroidism, unspecified type   8. Constipation, unspecified constipation type   9. Gastroesophageal reflux disease with esophagitis   10. VT (ventricular tachycardia) ([place])   11. Chronic combined systolic and diastolic congestive heart failure ([place])   12. Essential hypertension     Family/ staff Communication:   Labs/tests ordered:

## 2016-05-07 NOTE — Assessment & Plan Note (Signed)
04/04/16 Dr Croitoru Midodrine 10mg  8am, 5mg  12N, 5mg  4pm

## 2016-05-07 NOTE — Assessment & Plan Note (Addendum)
No urinary retention, taking Tamsulosin 0.4mg  daily. 04/04/16 Urology, hx of hematuria.

## 2016-05-07 NOTE — Progress Notes (Signed)
Location:  [facility] Room Number: 35 Place of Service:  SNF (31) Provider:  Antwann Preziosi, Manxie    NP  Jeanmarie Hubert, MD  Patient Care Team: Estill Dooms, MD as PCP - General (Internal Medicine) [name], MD as Attending Physician (Cardiology) Vevelyn Royals, MD as Consulting Physician (Ophthalmology) Franchot Gallo, MD as Consulting Physician (Urology) Tanda Rockers, MD as Consulting Physician (Pulmonary Disease) Allyn Kenner, MD (Dermatology) Deliah Goody, PA-C as Physician Assistant (Physician Assistant) Artia Singley Otho Darner, NP as Nurse Practitioner (Internal Medicine)  Extended Emergency Contact Information Primary Emergency Contact: Stemmer,[name] Address: [place], [place] Phone: [phone] Mobile Phone: [phone] Relation: Spouse Secondary Emergency Contact: [name],Horan  [place] Phone: [phone] Mobile Phone: [phone] Relation: Daughter  Code Status:  Full Code  Goals of care: Advanced Directive information Advanced Directives 05/07/2016  Does Patient Have a Medical Advance Directive? Yes  Type of Paramedic of [place];Living will  Does patient want to make changes to medical advance directive? No - Patient declined  Copy of [place] in Chart? Yes  Would patient like information on creating a medical advance directive? -  Pre-existing out of facility DNR order (yellow form or pink MOST form) -     Chief Complaint  Patient presents with  . Acute Visit    not feeling well, lost of appetite, nausea    HPI:  Pt is a 83 y.o. male seen today for an acute visit for not feeling well, nauseated, didn't rest well last night, denied chest pain, cough, dysuria, he is afebrile, no O2 desaturation.   Hx of VT, taking Amiodarone 400mg  daily, CHF managed with prn Torsemide 20mg , taking Levothyroxine 128mcg, last TSH 2.58 11/27/15.  BPH, managed with Tamsulosin 0.4mg  daily, sleeps with aid of Trazodone 50mg  at night. His mood is stable, prn Clonazepam available to him. RLS prn Requip and Clonazepam available to him. Orthostatic hypotension is managed with 04/04/16 Dr Sallyanne Kuster Midodrine 10mg  8am, 5mg  12N, 5mg  4pm   Past Medical History:  Diagnosis Date  . Arthritis    "minor, back and sometimes knees" (12/15/2012)  . Bradycardia    AFib/SSS s/p St Jude PPM 04/12/2008  . CAD (coronary artery disease) 12/30/2014   CABG (LIMA-LAD, SVG-RCA, SVG-OM in 1996).  07/2009 BMS to SVG-RCA. Cath in 04/2010 with patent stents   . Cardiomyopathy, ischemic 08/25/2012  . CHF (congestive heart failure) ([place])   . Chronic knee pain 12/03/2014  . Combined congestive systolic and diastolic heart failure ([place]) 02/02/2015   Hx EF 41%. BNP 96.8 02/21/15 Torsemide 04/06/15 Na 142, K 4.6, Bun 16, creat 0.89 04/20/14 BNP 111.7, Na 142, K 4.6, Bun 16, creat 0.9   . Depression with anxiety 02/02/2015   02/21/15 Hgb A1c 5.8 03/10/15 MMSE 30/30   . Dizziness, after diuretic asscoiated with hypotension and responded to fluid bolus 06/05/2011   04/28/15 US carotid R+L normal bilateral arterial velocities.    Marland Kitchen Dyspnea 08/11/2014   Followed in Pulmonary clinic/ [facility]/ [facility]  - 08/11/2014  Walked RA x 1 laps @ 185 ft each stopped due to fatigue/off balance/ slow pace/  no sob or desat  - PFT's  09/26/2014  FEV1 2.26 (85 % ) ratio 76  p no % improvement from saba with DLCO  67 % corrects to 93 % for alv volume  Since prev study 08/04/13 minimal change lung vol or dlco    . Embolic cerebral infarction ([place]) 12/06/2015  . Exertional shortness of breath    "sometimes walking" (12/15/2012)  . GERD (gastroesophageal reflux disease)   . Gout 02/09/2015  . Heart murmur    "just told I had one today" (12/15/2012)  . Hiatal hernia   . Hyperlipidemia   . Hypertension   . Hypothyroid   . Influenza A 03/10/2016  . Insomnia   . Melanoma of back ([place]) 1976  . Myocardial  infarction 1996; 2011   "both silent" (12/15/2012)  . Nonrheumatic aortic valve stenosis   . Orthostatic hypotension   . Osteoporosis, senile   . Pacemaker   . RBBB   . Restless leg 02/02/2015  . Right leg weakness 12/06/2015  . [facility] spotted fever   . S/P CABG x 4   . Sick sinus syndrome ([place]) 01/31/2014  . Sustained ventricular tachycardia ([place]) 07/27/2014  . Urinary retention 12/30/2014   Past Surgical History:  Procedure Laterality Date  . CARDIAC CATHETERIZATION  04/2010   LIMA to LAD patent,SVG to OM patent,no in-stnet restenosis RCA  . CATARACT EXTRACTION W/ INTRAOCULAR LENS  IMPLANT, BILATERAL Bilateral 2012  . CORONARY ANGIOPLASTY WITH STENT PLACEMENT  07/2009   bare metal stent to SVG to the RCA  . CORONARY ARTERY BYPASS GRAFT  1996   LIMA to LAD,SVG to RCA & SVG to OM  . INSERT / REPLACE / REMOVE PACEMAKER  2010  . MELANOMA EXCISION  05/1974 X2   "taken off my back" (12/15/2012)  . NM MYOVIEW LTD  06/2011   low risk  . TONSILLECTOMY  1938  . TRANSURETHRAL RESECTION OF PROSTATE  1986  . US ECHOCARDIOGRAPHY  07/11/2009   EF 45-50%    Allergies  Allergen Reactions  . Altace [Ramipril] Cough  . Crestor [Rosuvastatin Calcium] Rash  . Penicillins Rash and Other (See Comments)    Has patient had a PCN reaction causing immediate rash, facial/tongue/throat swelling, SOB or lightheadedness with hypotension: No Has patient had a PCN reaction causing severe rash involving mucus membranes or skin necrosis: Yes Has patient had a PCN reaction that required hospitalization Yes Has patient had a PCN reaction occurring within the last 10 years: No If all of the above answers are "NO", then may proceed with Cephalosporin use.   . Sulfa Antibiotics Rash    Allergies as of 05/07/2016      Reactions   Altace [ramipril] Cough   Crestor [rosuvastatin Calcium] Rash   Penicillins Rash, Other (See Comments)   Has patient had a PCN reaction causing immediate rash,  facial/tongue/throat swelling, SOB or lightheadedness with hypotension: No Has patient had a PCN reaction causing severe rash involving mucus membranes or skin necrosis: Yes Has patient had a PCN reaction that required hospitalization Yes Has patient had a PCN reaction occurring within the last 10 years: No If all of the above answers are "NO", then may proceed with Cephalosporin use.   Sulfa Antibiotics Rash      Medication List       Accurate as of 05/07/16  1:15 PM. Always use your most recent med list.          acetaminophen 325 MG tablet Commonly known as:  TYLENOL Take by mouth. Take 2 tablets every four hours for fever greater than 100.3 x 24 hours   amiodarone 200 MG tablet Commonly known as:  PACERONE Take 400 mg by mouth daily.  aspirin EC 81 MG tablet Take 1 tablet (81 mg total) by mouth daily.   clobetasol 0.05 % external solution Commonly known as:  TEMOVATE Apply 1 application topically. Apply one application topically as needed for itchy scalp   clonazePAM 0.5 MG tablet Commonly known as:  KLONOPIN Take 0.5 mg by mouth 3 (three) times daily as needed for anxiety.   clopidogrel 75 MG tablet Commonly known as:  PLAVIX Take 1 tablet (75 mg total) by mouth daily.   fludrocortisone 0.1 MG tablet Commonly known as:  FLORINEF One daily to help supporrt BP   levothyroxine 137 MCG tablet Commonly known as:  SYNTHROID, LEVOTHROID Take 1 tablet (137 mcg total) by mouth daily before breakfast.   lubiprostone 24 MCG capsule Commonly known as:  AMITIZA Take 24 mcg by mouth at bedtime.   meloxicam 7.5 MG tablet Commonly known as:  MOBIC Take 7.5 mg by mouth. Take one tablet daily   midodrine 5 MG tablet Commonly known as:  PROAMATINE Take 5 mg by mouth 3 (three) times daily with meals. Take one tablet twice a day (12:00 and 4:00)   pantoprazole 40 MG tablet Commonly known as:  PROTONIX Take 40 mg by mouth at bedtime.   polyethylene glycol packet Commonly  known as:  MIRALAX / GLYCOLAX Take 17 g by mouth daily as needed for mild constipation.   pravastatin 40 MG tablet Commonly known as:  PRAVACHOL Take 1 tablet by mouth daily.   rOPINIRole 0.5 MG tablet Commonly known as:  REQUIP Take 0.5 mg by mouth 3 (three) times daily as needed (for restless leg syndrome).   senna-docusate 8.6-50 MG tablet Commonly known as:  Senokot-S Take 4 tablets by mouth daily as needed for mild constipation.   tamsulosin 0.4 MG Caps capsule Commonly known as:  FLOMAX Take 0.4 mg by mouth at bedtime.   torsemide 20 MG tablet Commonly known as:  DEMADEX Take 20 mg by mouth daily as needed (for weight gain of 3 or more lbs).   traZODone 50 MG tablet Commonly known as:  DESYREL Take 50 mg by mouth at bedtime.   triamcinolone cream 0.1 % Commonly known as:  KENALOG Apply 1 application topically 2 (two) times daily as needed (for irritation).   Vitamin D3 5000 units Caps Take 5,000 Units by mouth every Monday.       Review of Systems  Constitutional: Positive for appetite change and fatigue. Negative for activity change, fever and unexpected weight change.  HENT: Positive for hearing loss and tinnitus. Negative for congestion, ear pain, rhinorrhea, sore throat, trouble swallowing and voice change.   Eyes: Negative for photophobia and pain.       Corrective lenses  Respiratory: Positive for cough. Negative for choking, chest tightness, shortness of breath and wheezing.   Cardiovascular: Positive for leg swelling (2+ and usingcompression hosiery). Negative for chest pain and palpitations.       Congestive cardiomyopathy. Coronary artery disease, status post CABG. Cardiac arrhythmias, status post pacemaker implanted left upper chest.  Gastrointestinal: Positive for constipation and nausea. Negative for abdominal distention, abdominal pain, diarrhea and vomiting.  Endocrine: Negative for cold intolerance, heat intolerance, polydipsia, polyphagia and  polyuria.  Genitourinary: Positive for enuresis and frequency. Negative for dysuria, hematuria, testicular pain and urgency.       Double voids. Urgency. Foley  Musculoskeletal: Positive for gait problem (using rolling walker). Negative for arthralgias, back pain, myalgias and neck pain.       Ambulates with walker.  Skin: Negative for color change, pallor, rash (both arms. Seborrhea capitis.) and wound (improvement in left 2nd toe medial side ulcereation and swelling and erythema of the entire toe.).       The left 2nd toe healed. Buttock wound is healed.   Allergic/Immunologic: Negative.   Neurological: Positive for dizziness, speech difficulty (sometimes has difficulty with word finding) and weakness. Negative for tremors, syncope, numbness and headaches.       MRI brain disclosed cerebral atrophy and microvascular disease.  Insomnia and restless legs are improved. Patient reports that he has H/O dizziness. This makes the patient feel unbalanced. R hand numbness.  Cognitive decline since 2014. Restless legs day and night.  Hematological: Negative for adenopathy. Does not bruise/bleed easily.  Psychiatric/Behavioral: Positive for sleep disturbance (nocturia x 7). Negative for behavioral problems, confusion, decreased concentration, hallucinations and suicidal ideas. The patient is not nervous/anxious.        Not enough sleep, decreased energy     Immunization History  Administered Date(s) Administered  . Influenza-Unspecified 12/05/2013, 11/05/2014, 11/16/2015  . PPD Test 03/07/2014  . Pneumococcal-Unspecified 10/05/2009   Pertinent  Health Maintenance Due  Topic Date Due  . PNA vac Low Risk Adult (2 of 2 - PCV13) 10/06/2010  . INFLUENZA VACCINE  09/04/2016  . DEXA SCAN  12/14/2024   Fall Risk  10/30/2015 09/05/2015 02/02/2015  Falls in the past year? No No No  Risk for fall due to : Impaired balance/gait - -   Functional Status Survey:    Vitals:   05/07/16 1249  BP: 113/65    Pulse: 70  Resp: 18  Temp: 97.6 F (36.4 C)  SpO2: 95%  Weight: 172 lb (78 kg)  Height: 5\' 11"  (1.803 m)   Body mass index is 23.99 kg/m. Physical Exam  Constitutional: He is oriented to person, place, and time. He appears well-developed and well-nourished. No distress.  Frail.  HENT:  Head: Normocephalic and atraumatic.  Right Ear: External ear normal.  Left Ear: External ear normal.  Nose: Nose normal.  Mouth/Throat: Oropharynx is clear and moist. No oropharyngeal exudate.  Eyes: Conjunctivae and EOM are normal. Pupils are equal, round, and reactive to light. Right eye exhibits no discharge. Left eye exhibits no discharge.  Neck: Normal range of motion. Neck supple. No JVD present. No tracheal deviation present. No thyromegaly present.  Cardiovascular: Normal rate, regular rhythm and intact distal pulses.  Exam reveals no gallop and no friction rub.   Murmur heard. 3-2/2 systolic  Pulmonary/Chest: Effort normal and breath sounds normal. No respiratory distress. He has no wheezes. He has no rales. He exhibits no tenderness.  Abdominal: He exhibits no distension and no mass. There is no tenderness.  abd hernia  Genitourinary: No penile tenderness.  Genitourinary Comments: Foley   Musculoskeletal: Normal range of motion. He exhibits edema (trace to 1+ bipedal). He exhibits no tenderness.  Gait disturbance and loss of balance. Patient arrives in a  wheelchair today.  Lymphadenopathy:    He has no cervical adenopathy.  Neurological: He is alert and oriented to person, place, and time. He has normal reflexes. No cranial nerve deficit. He exhibits normal muscle tone. Coordination normal.  Skin: Skin is warm and dry. No rash noted. No erythema. No pallor.  Seborrhea of the scalp.   Psychiatric: He has a normal mood and affect. His behavior is normal. Judgment and thought content normal.    Labs reviewed:  Recent Labs  01/11/16 0537 03/10/16 1443 03/11/16 0436 04/03/16  NA  140 140 139 138  K 4.1 3.5 3.7 4.6  CL 106 104 108  --   CO2 27 29 25   --   GLUCOSE 97 96 105*  --   BUN 13 22* 18 15  CREATININE 0.79 1.03 0.72 0.7  CALCIUM 8.8* 8.7* 8.0*  --     Recent Labs  01/10/16 1400 03/10/16 1443 03/11/16 0436 04/03/16  AST 25 53* 46* 15  ALT 22 26 23 14   ALKPHOS 68 61 51 60  BILITOT 0.4 0.6 0.4  --   PROT 6.4* 6.9 5.8*  --   ALBUMIN 3.5 3.7 3.1*  --     Recent Labs  12/06/15 1617 01/10/16 1400  01/11/16 0537 03/10/16 1443 03/11/16 0436 04/03/16  WBC 5.4 6.0  --  6.4 8.3 6.3 5.6  NEUTROABS 3.3 4.1  --   --  5.8  --   --   HGB 13.5 14.1  < > 13.9 13.1 12.0* 13.5  HCT 41.4 42.7  < > 42.2 39.5 36.9* 40*  MCV 98.6 97.7  --  97.2 95.6 95.8  --   PLT 183 190  --  188 190 176 225  < > = values in this interval not displayed. Lab Results  Component Value Date   TSH 2.58 11/27/2015   Lab Results  Component Value Date   HGBA1C 5.6 12/07/2015   Lab Results  Component Value Date   CHOL 84 12/07/2015   HDL 41 12/07/2015   LDLCALC 23 12/07/2015   TRIG 102 12/07/2015   CHOLHDL 2.0 12/07/2015    Significant Diagnostic Results in last 30 days:  No results found.  Assessment/Plan Orthostatic hypotension 04/04/16 Dr Sallyanne Kuster Midodrine 10mg  8am, 5mg  12N, 5mg  4pm   Depression with anxiety Stable, continue Trazodone 50mg  hs, prn Clonazepam 0.5mg  tid  Weak not feeling well, nauseated, didn't rest well last night, denied chest pain, cough, dysuria, he is afebrile, no O2 desaturation.  Obtain UA C/S, CBC, CMP  Insomnia Taking Trazodone 50mg  at night.   RLS (restless legs syndrome) Prn Requip and Clonazepam available to him  Urinary retention No urinary retention, taking Tamsulosin 0.4mg  daily. 04/04/16 Urology, hx of hematuria.  Hypothyroidism continue Levothyroxine 133mcg, last TSH 2.58 11/27/15, update TSH  Constipation Managed with daily Amitiza 58mcg qhs, prn MiraLax, prn Senna S 03/29/16 KUB no bowel obstruction,    GERD  (gastroesophageal reflux disease) Stable, continue Protonix daily.   VT (ventricular tachycardia) (HCC) Hx of VT, taking Amiodarone 400mg  daily, heart rate is in control.   Combined congestive systolic and diastolic heart failure (HCC) Compensated clinically, chronic BLE edema, continue Torsemide prn  Hypertension Controlled, prn Torsemide available for edema and HTN      Family/ staff Communication: IL when able.   Labs/tests ordered:  UA C/S, CBC, CMP, TSH

## 2016-05-07 NOTE — Assessment & Plan Note (Signed)
not feeling well, nauseated, didn't rest well last night, denied chest pain, cough, dysuria, he is afebrile, no O2 desaturation.  Obtain UA C/S, CBC, CMP

## 2016-05-07 NOTE — Assessment & Plan Note (Signed)
Stable, continue Trazodone 50mg  hs, prn Clonazepam 0.5mg  tid

## 2016-05-07 NOTE — Assessment & Plan Note (Signed)
Hx of VT, taking Amiodarone 400mg  daily, heart rate is in control.

## 2016-05-07 NOTE — Assessment & Plan Note (Signed)
Compensated clinically, chronic BLE edema, continue Torsemide prn

## 2016-05-07 NOTE — Assessment & Plan Note (Signed)
Controlled, prn Torsemide available for edema and HTN

## 2016-05-07 NOTE — Assessment & Plan Note (Signed)
Prn Requip and Clonazepam available to him

## 2016-05-07 NOTE — Assessment & Plan Note (Signed)
Stable, continue Protonix daily.  

## 2016-05-07 NOTE — Assessment & Plan Note (Signed)
Managed with daily Amitiza 85mcg qhs, prn MiraLax, prn Senna S 03/29/16 KUB no bowel obstruction,

## 2016-05-07 NOTE — Assessment & Plan Note (Signed)
continue Levothyroxine 147mcg, last TSH 2.58 11/27/15, update TSH

## 2016-05-07 NOTE — Assessment & Plan Note (Signed)
Taking Trazodone 50mg  at night.

## 2016-05-08 LAB — BASIC METABOLIC PANEL
BUN: 13 mg/dL (ref 4–21)
Creatinine: 0.8 mg/dL (ref <=1.3)
Glucose: 98 mg/dL
Potassium: 4.1 mmol/L (ref 3.4–5.3)
Sodium: 142 mmol/L (ref 137–147)

## 2016-05-08 LAB — CBC AND DIFFERENTIAL
HCT: 44 % (ref 41–53)
Hemoglobin: 14.8 g/dL (ref 13.5–17.5)
Platelets: 280 10*3/uL (ref 150–399)
WBC: 9.2 10^3/mL

## 2016-05-08 LAB — HEPATIC FUNCTION PANEL
ALT: 19 U/L (ref 10–40)
AST: 20 U/L (ref 14–40)
Alkaline Phosphatase: 78 U/L (ref 25–125)
Bilirubin, Total: 0.4 mg/dL

## 2016-05-08 LAB — TSH: TSH: 1.96 u[IU]/mL (ref <=5.90)

## 2016-05-10 ENCOUNTER — Other Ambulatory Visit: Payer: Self-pay | Admitting: *Deleted

## 2016-05-14 ENCOUNTER — Non-Acute Institutional Stay (SKILLED_NURSING_FACILITY): Payer: Medicare Other | Admitting: Nurse Practitioner

## 2016-05-14 ENCOUNTER — Encounter: Payer: Self-pay | Admitting: Nurse Practitioner

## 2016-05-14 DIAGNOSIS — N179 Acute kidney failure, unspecified: Secondary | ICD-10-CM

## 2016-05-14 DIAGNOSIS — G4700 Insomnia, unspecified: Secondary | ICD-10-CM | POA: Diagnosis not present

## 2016-05-14 DIAGNOSIS — I5042 Chronic combined systolic (congestive) and diastolic (congestive) heart failure: Secondary | ICD-10-CM

## 2016-05-14 DIAGNOSIS — I951 Orthostatic hypotension: Secondary | ICD-10-CM

## 2016-05-14 DIAGNOSIS — R05 Cough: Secondary | ICD-10-CM

## 2016-05-14 DIAGNOSIS — G2581 Restless legs syndrome: Secondary | ICD-10-CM | POA: Diagnosis not present

## 2016-05-14 DIAGNOSIS — M1990 Unspecified osteoarthritis, unspecified site: Secondary | ICD-10-CM

## 2016-05-14 DIAGNOSIS — K2100 Gastro-esophageal reflux disease with esophagitis, without bleeding: Secondary | ICD-10-CM

## 2016-05-14 DIAGNOSIS — I10 Essential (primary) hypertension: Secondary | ICD-10-CM | POA: Diagnosis not present

## 2016-05-14 DIAGNOSIS — F418 Other specified anxiety disorders: Secondary | ICD-10-CM

## 2016-05-14 DIAGNOSIS — K5900 Constipation, unspecified: Secondary | ICD-10-CM | POA: Diagnosis not present

## 2016-05-14 DIAGNOSIS — R339 Retention of urine, unspecified: Secondary | ICD-10-CM | POA: Diagnosis not present

## 2016-05-14 DIAGNOSIS — E039 Hypothyroidism, unspecified: Secondary | ICD-10-CM

## 2016-05-14 DIAGNOSIS — N390 Urinary tract infection, site not specified: Secondary | ICD-10-CM

## 2016-05-14 DIAGNOSIS — R059 Cough, unspecified: Secondary | ICD-10-CM

## 2016-05-14 DIAGNOSIS — M1000 Idiopathic gout, unspecified site: Secondary | ICD-10-CM

## 2016-05-14 NOTE — Assessment & Plan Note (Signed)
Stable, continue Protonix daily.  

## 2016-05-14 NOTE — Progress Notes (Signed)
Location:  [place] Room Number: 35 Place of Service:  SNF (31) Provider:  Nizhoni Parlow, Manxie  NP  Jeanmarie Hubert, MD  Patient Care Team: Estill Dooms, MD as PCP - General (Internal Medicine) [name], MD as Attending Physician (Cardiology) Vevelyn Royals, MD as Consulting Physician (Ophthalmology) Franchot Gallo, MD as Consulting Physician (Urology) Tanda Rockers, MD as Consulting Physician (Pulmonary Disease) Allyn Kenner, MD (Dermatology) Deliah Goody, PA-C as Physician Assistant (Physician Assistant) Takela Varden Otho Darner, NP as Nurse Practitioner (Internal Medicine)  Extended Emergency Contact Information Primary Emergency Contact: Scarpulla,[name] Address: [place], [place] Phone: [phone] Mobile Phone: [phone] Relation: Spouse Secondary Emergency Contact: [name],Wildasin  [place] Phone: [phone] Mobile Phone: [phone] Relation: Daughter  Code Status:  DNR Goals of care: Advanced Directive information Advanced Directives 05/14/2016  Does Patient Have a Medical Advance Directive? Yes  Type of Paramedic of [place];Living will  Does patient want to make changes to medical advance directive? No - Patient declined  Copy of [place] in Chart? Yes  Would patient like information on creating a medical advance directive? -  Pre-existing out of facility DNR order (yellow form or pink MOST form) -     Chief Complaint  Patient presents with  . Acute Visit    Bowel Obstruction    HPI:  Pt is a 83 y.o. male seen today for an acute visit for constipation, currently taking Amitiza daily, prn MiraLax and Senokot S, last BM 2 days ago, 05/11/16 KUB showed constipation, no bowel obstruction.    Hx of VT, taking Amiodarone 400mg  daily, CHF managed with prn Torsemide20mg , taking Levothyroxine 117mcg, last TSH 1.96 05/09/16. BPH, managed with  Tamsulosin 0.4mg  daily, Foley.  sleeps with aid of Trazodone 50mg  at night. His mood is stable, prn Clonazepam available to him. RLS prn Requip and Clonazepam available to him. Orthostatic hypotension is managed with 04/04/16 Dr Sallyanne Kuster Midodrine 10mg  8am, 5mg  12N, 5mg  4pm  Past Medical History:  Diagnosis Date  . Arthritis    "minor, back and sometimes knees" (12/15/2012)  . Bradycardia    AFib/SSS s/p St Jude PPM 04/12/2008  . CAD (coronary artery disease) 12/30/2014   CABG (LIMA-LAD, SVG-RCA, SVG-OM in 1996).  07/2009 BMS to SVG-RCA. Cath in 04/2010 with patent stents   . Cardiomyopathy, ischemic 08/25/2012  . CHF (congestive heart failure) ([place])   . Chronic knee pain 12/03/2014  . Combined congestive systolic and diastolic heart failure ([place]) 02/02/2015   Hx EF 41%. BNP 96.8 02/21/15 Torsemide 04/06/15 Na 142, K 4.6, Bun 16, creat 0.89 04/20/14 BNP 111.7, Na 142, K 4.6, Bun 16, creat 0.9   . Depression with anxiety 02/02/2015   02/21/15 Hgb A1c 5.8 03/10/15 MMSE 30/30   . Dizziness, after diuretic asscoiated with hypotension and responded to fluid bolus 06/05/2011   04/28/15 US carotid R+L normal bilateral arterial velocities.    Marland Kitchen Dyspnea 08/11/2014   Followed in Pulmonary clinic/ [facility]/ [place]  - 08/11/2014  Walked RA x 1 laps @ 185 ft each stopped due to fatigue/off balance/ slow pace/  no sob or desat  - PFT's  09/26/2014  FEV1 2.26 (85 % ) ratio 76  p no % improvement from saba with DLCO  67 % corrects to 93 % for alv volume      Since prev study 08/04/13 minimal change  lung vol or dlco    . Embolic cerebral infarction ([place]) 12/06/2015  . Exertional shortness of breath    "sometimes walking" (12/15/2012)  . GERD (gastroesophageal reflux disease)   . Gout 02/09/2015  . Heart murmur    "just told I had one today" (12/15/2012)  . Hiatal hernia   . Hyperlipidemia   . Hypertension   . Hypothyroid   . Influenza A 03/10/2016  . Insomnia   . Melanoma of back ([place]) 1976  . Myocardial infarction  1996; 2011   "both silent" (12/15/2012)  . Nonrheumatic aortic valve stenosis   . Orthostatic hypotension   . Osteoporosis, senile   . Pacemaker   . RBBB   . Restless leg 02/02/2015  . Right leg weakness 12/06/2015  . [facility] spotted fever   . S/P CABG x 4   . Sick sinus syndrome ([place]) 01/31/2014  . Sustained ventricular tachycardia ([place]) 07/27/2014  . Urinary retention 12/30/2014   Past Surgical History:  Procedure Laterality Date  . CARDIAC CATHETERIZATION  04/2010   LIMA to LAD patent,SVG to OM patent,no in-stnet restenosis RCA  . CATARACT EXTRACTION W/ INTRAOCULAR LENS  IMPLANT, BILATERAL Bilateral 2012  . CORONARY ANGIOPLASTY WITH STENT PLACEMENT  07/2009   bare metal stent to SVG to the RCA  . CORONARY ARTERY BYPASS GRAFT  1996   LIMA to LAD,SVG to RCA & SVG to OM  . INSERT / REPLACE / REMOVE PACEMAKER  2010  . MELANOMA EXCISION  05/1974 X2   "taken off my back" (12/15/2012)  . NM MYOVIEW LTD  06/2011   low risk  . TONSILLECTOMY  1938  . TRANSURETHRAL RESECTION OF PROSTATE  1986  . US ECHOCARDIOGRAPHY  07/11/2009   EF 45-50%    Allergies  Allergen Reactions  . Altace [Ramipril] Cough  . Crestor [Rosuvastatin Calcium] Rash  . Penicillins Rash and Other (See Comments)    Has patient had a PCN reaction causing immediate rash, facial/tongue/throat swelling, SOB or lightheadedness with hypotension: No Has patient had a PCN reaction causing severe rash involving mucus membranes or skin necrosis: Yes Has patient had a PCN reaction that required hospitalization Yes Has patient had a PCN reaction occurring within the last 10 years: No If all of the above answers are "NO", then may proceed with Cephalosporin use.   . Sulfa Antibiotics Rash    Allergies as of 05/14/2016      Reactions   Altace [ramipril] Cough   Crestor [rosuvastatin Calcium] Rash   Penicillins Rash, Other (See Comments)   Has patient had a PCN reaction causing immediate rash, facial/tongue/throat  swelling, SOB or lightheadedness with hypotension: No Has patient had a PCN reaction causing severe rash involving mucus membranes or skin necrosis: Yes Has patient had a PCN reaction that required hospitalization Yes Has patient had a PCN reaction occurring within the last 10 years: No If all of the above answers are "NO", then may proceed with Cephalosporin use.   Sulfa Antibiotics Rash      Medication List       Accurate as of 05/14/16  1:49 PM. Always use your most recent med list.          acetaminophen 325 MG tablet Commonly known as:  TYLENOL Take by mouth. Take 2 tablets every four hours for fever greater than 100.3 x 24 hours   amiodarone 200 MG tablet Commonly known as:  PACERONE Take 400 mg by mouth daily.   aspirin EC 81 MG tablet Take  1 tablet (81 mg total) by mouth daily.   clobetasol 0.05 % external solution Commonly known as:  TEMOVATE Apply 1 application topically 2 (two) times daily.   clonazePAM 0.5 MG tablet Commonly known as:  KLONOPIN Take 0.5 mg by mouth 3 (three) times daily as needed for anxiety.   clopidogrel 75 MG tablet Commonly known as:  PLAVIX Take 1 tablet (75 mg total) by mouth daily.   fludrocortisone 0.1 MG tablet Commonly known as:  FLORINEF One daily to help supporrt BP   levothyroxine 137 MCG tablet Commonly known as:  SYNTHROID, LEVOTHROID Take 1 tablet (137 mcg total) by mouth daily before breakfast.   lubiprostone 24 MCG capsule Commonly known as:  AMITIZA Take 24 mcg by mouth at bedtime.   meloxicam 7.5 MG tablet Commonly known as:  MOBIC Take 7.5 mg by mouth. Take one tablet daily   midodrine 5 MG tablet Commonly known as:  PROAMATINE Take 5 mg by mouth 3 (three) times daily with meals. Take one tablet twice a day (12:00 and 4:00)   pantoprazole 40 MG tablet Commonly known as:  PROTONIX Take 40 mg by mouth at bedtime.   polyethylene glycol packet Commonly known as:  MIRALAX / GLYCOLAX Take 17 g by mouth daily  as needed for mild constipation.   pravastatin 40 MG tablet Commonly known as:  PRAVACHOL Take 1 tablet by mouth daily.   rOPINIRole 0.5 MG tablet Commonly known as:  REQUIP Take 0.5 mg by mouth 3 (three) times daily as needed (for restless leg syndrome).   senna-docusate 8.6-50 MG tablet Commonly known as:  Senokot-S Take 4 tablets by mouth daily as needed for mild constipation.   tamsulosin 0.4 MG Caps capsule Commonly known as:  FLOMAX Take 0.4 mg by mouth at bedtime.   torsemide 20 MG tablet Commonly known as:  DEMADEX Take 20 mg by mouth daily as needed (for weight gain of 3 or more lbs).   traZODone 50 MG tablet Commonly known as:  DESYREL Take 50 mg by mouth at bedtime.   triamcinolone cream 0.1 % Commonly known as:  KENALOG Apply 1 application topically 2 (two) times daily as needed (for irritation).   Vitamin D3 5000 units Caps Take 5,000 Units by mouth every Monday.       Review of Systems  Constitutional: Positive for appetite change and fatigue. Negative for activity change, fever and unexpected weight change.  HENT: Positive for hearing loss and tinnitus. Negative for congestion, ear pain, rhinorrhea, sore throat, trouble swallowing and voice change.   Eyes: Negative for photophobia and pain.       Corrective lenses  Respiratory: Positive for cough. Negative for choking, chest tightness, shortness of breath and wheezing.   Cardiovascular: Positive for leg swelling (2+ and usingcompression hosiery). Negative for chest pain and palpitations.       Congestive cardiomyopathy. Coronary artery disease, status post CABG. Cardiac arrhythmias, status post pacemaker implanted left upper chest.  Gastrointestinal: Positive for constipation and nausea. Negative for abdominal distention, abdominal pain, diarrhea and vomiting.  Endocrine: Negative for cold intolerance, heat intolerance, polydipsia, polyphagia and polyuria.  Genitourinary: Positive for enuresis and  frequency. Negative for dysuria, hematuria, testicular pain and urgency.       Double voids. Urgency. Foley  Musculoskeletal: Positive for gait problem (using rolling walker). Negative for arthralgias, back pain, myalgias and neck pain.       Ambulates with walker.   Skin: Negative for color change, pallor, rash (both arms. Seborrhea  capitis.) and wound (improvement in left 2nd toe medial side ulcereation and swelling and erythema of the entire toe.).       The left 2nd toe healed. Buttock wound is healed.   Allergic/Immunologic: Negative.   Neurological: Positive for dizziness, speech difficulty (sometimes has difficulty with word finding) and weakness. Negative for tremors, syncope, numbness and headaches.       MRI brain disclosed cerebral atrophy and microvascular disease.  Insomnia and restless legs are improved. Patient reports that he has H/O dizziness. This makes the patient feel unbalanced. R hand numbness.  Cognitive decline since 2014. Restless legs day and night.  Hematological: Negative for adenopathy. Does not bruise/bleed easily.  Psychiatric/Behavioral: Positive for sleep disturbance (nocturia x 7). Negative for behavioral problems, confusion, decreased concentration, hallucinations and suicidal ideas. The patient is not nervous/anxious.        Not enough sleep, decreased energy     Immunization History  Administered Date(s) Administered  . Influenza-Unspecified 12/05/2013, 11/05/2014, 11/16/2015  . PPD Test 03/07/2014  . Pneumococcal-Unspecified 10/05/2009   Pertinent  Health Maintenance Due  Topic Date Due  . PNA vac Low Risk Adult (2 of 2 - PCV13) 10/06/2010  . INFLUENZA VACCINE  09/04/2016  . DEXA SCAN  12/14/2024   Fall Risk  10/30/2015 09/05/2015 02/02/2015  Falls in the past year? No No No  Risk for fall due to : Impaired balance/gait - -   Functional Status Survey:    Vitals:   05/14/16 1326  BP: 106/65  Pulse: 70  Resp: 20  Temp: (!) 96.7 F (35.9 C)    SpO2: 96%  Weight: 174 lb (78.9 kg)  Height: 5\' 11"  (1.803 m)   Body mass index is 24.27 kg/m. Physical Exam  Constitutional: He is oriented to person, place, and time. He appears well-developed and well-nourished. No distress.  Frail.  HENT:  Head: Normocephalic and atraumatic.  Right Ear: External ear normal.  Left Ear: External ear normal.  Nose: Nose normal.  Mouth/Throat: Oropharynx is clear and moist. No oropharyngeal exudate.  Eyes: Conjunctivae and EOM are normal. Pupils are equal, round, and reactive to light. Right eye exhibits no discharge. Left eye exhibits no discharge.  Neck: Normal range of motion. Neck supple. No JVD present. No tracheal deviation present. No thyromegaly present.  Cardiovascular: Normal rate, regular rhythm and intact distal pulses.  Exam reveals no gallop and no friction rub.   Murmur heard. 6-9/6 systolic  Pulmonary/Chest: Effort normal and breath sounds normal. No respiratory distress. He has no wheezes. He has no rales. He exhibits no tenderness.  Abdominal: He exhibits no distension and no mass. There is no tenderness.  abd hernia  Genitourinary: No penile tenderness.  Genitourinary Comments: Foley(failed removal)  Musculoskeletal: Normal range of motion. He exhibits edema (trace to 1+ bipedal). He exhibits no tenderness.  Gait disturbance and loss of balance. Patient arrives in a  wheelchair today.  Lymphadenopathy:    He has no cervical adenopathy.  Neurological: He is alert and oriented to person, place, and time. He has normal reflexes. No cranial nerve deficit. He exhibits normal muscle tone. Coordination normal.  Skin: Skin is warm and dry. No rash noted. No erythema. No pallor.  Seborrhea of the scalp.   Psychiatric: He has a normal mood and affect. His behavior is normal. Judgment and thought content normal.    Labs reviewed:  Recent Labs  01/11/16 0537 03/10/16 1443 03/11/16 0436 04/03/16 05/08/16  NA 140 140 139 138 142  K  4.1 3.5 3.7 4.6 4.1  CL 106 104 108  --   --   CO2 27 29 25   --   --   GLUCOSE 97 96 105*  --   --   BUN 13 22* 18 15 13   CREATININE 0.79 1.03 0.72 0.7 0.8  CALCIUM 8.8* 8.7* 8.0*  --   --     Recent Labs  01/10/16 1400 03/10/16 1443 03/11/16 0436 04/03/16 05/08/16  AST 25 53* 46* 15 20  ALT 22 26 23 14 19   ALKPHOS 68 61 51 60 78  BILITOT 0.4 0.6 0.4  --   --   PROT 6.4* 6.9 5.8*  --   --   ALBUMIN 3.5 3.7 3.1*  --   --     Recent Labs  12/06/15 1617 01/10/16 1400  01/11/16 0537 03/10/16 1443 03/11/16 0436 04/03/16 05/08/16  WBC 5.4 6.0  --  6.4 8.3 6.3 5.6 9.2  NEUTROABS 3.3 4.1  --   --  5.8  --   --   --   HGB 13.5 14.1  < > 13.9 13.1 12.0* 13.5 14.8  HCT 41.4 42.7  < > 42.2 39.5 36.9* 40* 44  MCV 98.6 97.7  --  97.2 95.6 95.8  --   --   PLT 183 190  --  188 190 176 225 280  < > = values in this interval not displayed. Lab Results  Component Value Date   TSH 1.96 05/08/2016   Lab Results  Component Value Date   HGBA1C 5.6 12/07/2015   Lab Results  Component Value Date   CHOL 84 12/07/2015   HDL 41 12/07/2015   LDLCALC 23 12/07/2015   TRIG 102 12/07/2015   CHOLHDL 2.0 12/07/2015    Significant Diagnostic Results in last 30 days:  No results found.  Assessment/Plan Constipation Managed with daily Amitiza 79mcg qhs, prn MiraLax, prn Senna S 03/29/16 KUB no bowel obstruction 05/11/16 KUB showed constipation, no bowel obstruction 05/14/16 Mg Citrate 244ml po stat, change MiraLax to daily, continue Amtiza 72mcg qd, prn Senokot S   Insomnia Taking Trazodone 50mg  at night, frequent awakenings, increase Trazodone to 75mg  qhs, prn Ambien 5mg  hs for 14 days, observe.   Hypertension Controlled, prn Torsemide available for edema and HTN  Orthostatic hypotension 04/04/16 Dr Sallyanne Kuster Midodrine 10mg  8am, 5mg  12N, 5mg  4pm   Combined congestive systolic and diastolic heart failure ([place]) Compensated clinically, chronic BLE edema, continue Torsemide prn  GERD  (gastroesophageal reflux disease) Stable, continue Protonix daily.   Hypothyroidism continue Levothyroxine 120mcg, last TSH 1.96 05/09/16  Osteoarthritis Multiple sits, stable, will change Meloxicam to prn, continue prn Tylenol, observe.  Urinary tract infection 05/10/16 pending urine culture, UA showed 3+ leukocyte esterase, >60 wbc, many bacteria.  AKI (acute kidney injury) ([place]) Creat 0.77 05/09/16  RLS (restless legs syndrome) Prn Requip and Clonazepam available to him  Gout stable  Depression with anxiety Not sleeping well at night, increaseTrazodone 75mg  hs, prn Clonazepam 0.5mg  tid  Cough Hacking in nature, denied chest pain or SOB, will try Mucinex 600mg  qhs.      Family/ staff Communication: IL when able.   Labs/tests ordered:  none

## 2016-05-14 NOTE — Assessment & Plan Note (Signed)
stable °

## 2016-05-14 NOTE — Assessment & Plan Note (Signed)
05/10/16 pending urine culture, UA showed 3+ leukocyte esterase, >60 wbc, many bacteria.

## 2016-05-14 NOTE — Assessment & Plan Note (Signed)
Controlled, prn Torsemide available for edema and HTN

## 2016-05-14 NOTE — Assessment & Plan Note (Signed)
Compensated clinically, chronic BLE edema, continue Torsemide prn

## 2016-05-14 NOTE — Assessment & Plan Note (Signed)
Hacking in nature, denied chest pain or SOB, will try Mucinex 600mg  qhs.

## 2016-05-14 NOTE — Assessment & Plan Note (Signed)
Creat 0.77 05/09/16

## 2016-05-14 NOTE — Assessment & Plan Note (Signed)
Multiple sits, stable, will change Meloxicam to prn, continue prn Tylenol, observe.

## 2016-05-14 NOTE — Assessment & Plan Note (Signed)
continue Levothyroxine 161mcg, last TSH 1.96 05/09/16

## 2016-05-14 NOTE — Assessment & Plan Note (Signed)
Taking Trazodone 50mg  at night, frequent awakenings, increase Trazodone to 75mg  qhs, prn Ambien 5mg  hs for 14 days, observe.

## 2016-05-14 NOTE — Assessment & Plan Note (Signed)
04/04/16 Dr Croitoru Midodrine 10mg  8am, 5mg  12N, 5mg  4pm

## 2016-05-14 NOTE — Assessment & Plan Note (Signed)
Prn Requip and Clonazepam available to him

## 2016-05-14 NOTE — Progress Notes (Signed)
Location:  [facility] Room Number: 35 Place of Service:  SNF (31) Provider:  Mast, Manxie  NP  Jeanmarie Hubert, MD  Patient Care Team: Estill Dooms, MD as PCP - General (Internal Medicine) [name], MD as Attending Physician (Cardiology) Vevelyn Royals, MD as Consulting Physician (Ophthalmology) Franchot Gallo, MD as Consulting Physician (Urology) Tanda Rockers, MD as Consulting Physician (Pulmonary Disease) Allyn Kenner, MD (Dermatology) Deliah Goody, PA-C as Physician Assistant (Physician Assistant) Man Otho Darner, NP as Nurse Practitioner (Internal Medicine)  Extended Emergency Contact Information Primary Emergency Contact: Brzoska,[name] Address: [place], [place] Phone: [phone] Mobile Phone: [phone] Relation: Spouse Secondary Emergency Contact: [name],Charity  [place] Phone: [phone] Mobile Phone: [phone] Relation: Daughter  Code Status:  DNR Goals of care: Advanced Directive information Advanced Directives 05/14/2016  Does Patient Have a Medical Advance Directive? Yes  Type of Paramedic of [place];Living will  Does patient want to make changes to medical advance directive? No - Patient declined  Copy of [place] in Chart? Yes  Would patient like information on creating a medical advance directive? -  Pre-existing out of facility DNR order (yellow form or pink MOST form) -     Chief Complaint  Patient presents with  . Acute Visit    Bowel Obstruction    HPI:  Pt is a 83 y.o. male seen today for an acute visit for    Past Medical History:  Diagnosis Date  . Arthritis    "minor, back and sometimes knees" (12/15/2012)  . Bradycardia    AFib/SSS s/p St Jude PPM 04/12/2008  . CAD (coronary artery disease) 12/30/2014   CABG (LIMA-LAD, SVG-RCA, SVG-OM in 1996).  07/2009 BMS to SVG-RCA. Cath in 04/2010 with  patent stents   . Cardiomyopathy, ischemic 08/25/2012  . CHF (congestive heart failure) ([place])   . Chronic knee pain 12/03/2014  . Combined congestive systolic and diastolic heart failure ([place]) 02/02/2015   Hx EF 41%. BNP 96.8 02/21/15 Torsemide 04/06/15 Na 142, K 4.6, Bun 16, creat 0.89 04/20/14 BNP 111.7, Na 142, K 4.6, Bun 16, creat 0.9   . Depression with anxiety 02/02/2015   02/21/15 Hgb A1c 5.8 03/10/15 MMSE 30/30   . Dizziness, after diuretic asscoiated with hypotension and responded to fluid bolus 06/05/2011   04/28/15 US carotid R+L normal bilateral arterial velocities.    Marland Kitchen Dyspnea 08/11/2014   Followed in Pulmonary clinic/ [facility]/ [facility]  - 08/11/2014  Walked RA x 1 laps @ 185 ft each stopped due to fatigue/off balance/ slow pace/  no sob or desat  - PFT's  09/26/2014  FEV1 2.26 (85 % ) ratio 76  p no % improvement from saba with DLCO  67 % corrects to 93 % for alv volume      Since prev study 08/04/13 minimal change lung vol or dlco    . Embolic cerebral infarction ([place]) 12/06/2015  . Exertional shortness of breath    "sometimes walking" (12/15/2012)  . GERD (gastroesophageal reflux disease)   . Gout 02/09/2015  . Heart murmur    "just told I had one today" (12/15/2012)  . Hiatal hernia   . Hyperlipidemia   . Hypertension   . Hypothyroid   . Influenza A 03/10/2016  . Insomnia   . Melanoma of back ([employer]) 1976  . Myocardial infarction 1996; 2011   "  both silent" (12/15/2012)  . Nonrheumatic aortic valve stenosis   . Orthostatic hypotension   . Osteoporosis, senile   . Pacemaker   . RBBB   . Restless leg 02/02/2015  . Right leg weakness 12/06/2015  . [facility] spotted fever   . S/P CABG x 4   . Sick sinus syndrome ([place]) 01/31/2014  . Sustained ventricular tachycardia ([place]) 07/27/2014  . Urinary retention 12/30/2014   Past Surgical History:  Procedure Laterality Date  . CARDIAC CATHETERIZATION  04/2010   LIMA to LAD patent,SVG to OM patent,no in-stnet restenosis RCA  .  CATARACT EXTRACTION W/ INTRAOCULAR LENS  IMPLANT, BILATERAL Bilateral 2012  . CORONARY ANGIOPLASTY WITH STENT PLACEMENT  07/2009   bare metal stent to SVG to the RCA  . CORONARY ARTERY BYPASS GRAFT  1996   LIMA to LAD,SVG to RCA & SVG to OM  . INSERT / REPLACE / REMOVE PACEMAKER  2010  . MELANOMA EXCISION  05/1974 X2   "taken off my back" (12/15/2012)  . NM MYOVIEW LTD  06/2011   low risk  . TONSILLECTOMY  1938  . TRANSURETHRAL RESECTION OF PROSTATE  1986  . US ECHOCARDIOGRAPHY  07/11/2009   EF 45-50%    Allergies  Allergen Reactions  . Altace [Ramipril] Cough  . Crestor [Rosuvastatin Calcium] Rash  . Penicillins Rash and Other (See Comments)    Has patient had a PCN reaction causing immediate rash, facial/tongue/throat swelling, SOB or lightheadedness with hypotension: No Has patient had a PCN reaction causing severe rash involving mucus membranes or skin necrosis: Yes Has patient had a PCN reaction that required hospitalization Yes Has patient had a PCN reaction occurring within the last 10 years: No If all of the above answers are "NO", then may proceed with Cephalosporin use.   . Sulfa Antibiotics Rash    Allergies as of 05/14/2016      Reactions   Altace [ramipril] Cough   Crestor [rosuvastatin Calcium] Rash   Penicillins Rash, Other (See Comments)   Has patient had a PCN reaction causing immediate rash, facial/tongue/throat swelling, SOB or lightheadedness with hypotension: No Has patient had a PCN reaction causing severe rash involving mucus membranes or skin necrosis: Yes Has patient had a PCN reaction that required hospitalization Yes Has patient had a PCN reaction occurring within the last 10 years: No If all of the above answers are "NO", then may proceed with Cephalosporin use.   Sulfa Antibiotics Rash      Medication List       Accurate as of 05/14/16  2:21 PM. Always use your most recent med list.          acetaminophen 325 MG tablet Commonly known as:   TYLENOL Take by mouth. Take 2 tablets every four hours for fever greater than 100.3 x 24 hours   amiodarone 200 MG tablet Commonly known as:  PACERONE Take 400 mg by mouth daily.   aspirin EC 81 MG tablet Take 1 tablet (81 mg total) by mouth daily.   clobetasol 0.05 % external solution Commonly known as:  TEMOVATE Apply 1 application topically 2 (two) times daily.   clonazePAM 0.5 MG tablet Commonly known as:  KLONOPIN Take 0.5 mg by mouth 3 (three) times daily as needed for anxiety.   clopidogrel 75 MG tablet Commonly known as:  PLAVIX Take 1 tablet (75 mg total) by mouth daily.   fludrocortisone 0.1 MG tablet Commonly known as:  FLORINEF One daily to help supporrt BP  levothyroxine 137 MCG tablet Commonly known as:  SYNTHROID, LEVOTHROID Take 1 tablet (137 mcg total) by mouth daily before breakfast.   lubiprostone 24 MCG capsule Commonly known as:  AMITIZA Take 24 mcg by mouth at bedtime.   meloxicam 7.5 MG tablet Commonly known as:  MOBIC Take 7.5 mg by mouth. Take one tablet daily   midodrine 5 MG tablet Commonly known as:  PROAMATINE Take 5 mg by mouth 3 (three) times daily with meals. Take one tablet twice a day (12:00 and 4:00)   pantoprazole 40 MG tablet Commonly known as:  PROTONIX Take 40 mg by mouth at bedtime.   polyethylene glycol packet Commonly known as:  MIRALAX / GLYCOLAX Take 17 g by mouth daily as needed for mild constipation.   pravastatin 40 MG tablet Commonly known as:  PRAVACHOL Take 1 tablet by mouth daily.   rOPINIRole 0.5 MG tablet Commonly known as:  REQUIP Take 0.5 mg by mouth 3 (three) times daily as needed (for restless leg syndrome).   senna-docusate 8.6-50 MG tablet Commonly known as:  Senokot-S Take 4 tablets by mouth daily as needed for mild constipation.   tamsulosin 0.4 MG Caps capsule Commonly known as:  FLOMAX Take 0.4 mg by mouth at bedtime.   torsemide 20 MG tablet Commonly known as:  DEMADEX Take 20 mg by  mouth daily as needed (for weight gain of 3 or more lbs).   traZODone 50 MG tablet Commonly known as:  DESYREL Take 50 mg by mouth at bedtime.   triamcinolone cream 0.1 % Commonly known as:  KENALOG Apply 1 application topically 2 (two) times daily as needed (for irritation).   Vitamin D3 5000 units Caps Take 5,000 Units by mouth every Monday.       Review of Systems  Immunization History  Administered Date(s) Administered  . Influenza-Unspecified 12/05/2013, 11/05/2014, 11/16/2015  . PPD Test 03/07/2014  . Pneumococcal-Unspecified 10/05/2009   Pertinent  Health Maintenance Due  Topic Date Due  . PNA vac Low Risk Adult (2 of 2 - PCV13) 10/06/2010  . INFLUENZA VACCINE  09/04/2016  . DEXA SCAN  12/14/2024   Fall Risk  10/30/2015 09/05/2015 02/02/2015  Falls in the past year? No No No  Risk for fall due to : Impaired balance/gait - -   Functional Status Survey:    Vitals:   05/14/16 1326  BP: 106/65  Pulse: 70  Resp: 20  Temp: (!) 96.7 F (35.9 C)  SpO2: 96%  Weight: 174 lb (78.9 kg)  Height: 5\' 11"  (1.803 m)   Body mass index is 24.27 kg/m. Physical Exam  Labs reviewed:  Recent Labs  01/11/16 0537 03/10/16 1443 03/11/16 0436 04/03/16 05/08/16  NA 140 140 139 138 142  K 4.1 3.5 3.7 4.6 4.1  CL 106 104 108  --   --   CO2 27 29 25   --   --   GLUCOSE 97 96 105*  --   --   BUN 13 22* 18 15 13   CREATININE 0.79 1.03 0.72 0.7 0.8  CALCIUM 8.8* 8.7* 8.0*  --   --     Recent Labs  01/10/16 1400 03/10/16 1443 03/11/16 0436 04/03/16 05/08/16  AST 25 53* 46* 15 20  ALT 22 26 23 14 19   ALKPHOS 68 61 51 60 78  BILITOT 0.4 0.6 0.4  --   --   PROT 6.4* 6.9 5.8*  --   --   ALBUMIN 3.5 3.7 3.1*  --   --  Recent Labs  12/06/15 1617 01/10/16 1400  01/11/16 0537 03/10/16 1443 03/11/16 0436 04/03/16 05/08/16  WBC 5.4 6.0  --  6.4 8.3 6.3 5.6 9.2  NEUTROABS 3.3 4.1  --   --  5.8  --   --   --   HGB 13.5 14.1  < > 13.9 13.1 12.0* 13.5 14.8  HCT 41.4  42.7  < > 42.2 39.5 36.9* 40* 44  MCV 98.6 97.7  --  97.2 95.6 95.8  --   --   PLT 183 190  --  188 190 176 225 280  < > = values in this interval not displayed. Lab Results  Component Value Date   TSH 1.96 05/08/2016   Lab Results  Component Value Date   HGBA1C 5.6 12/07/2015   Lab Results  Component Value Date   CHOL 84 12/07/2015   HDL 41 12/07/2015   LDLCALC 23 12/07/2015   TRIG 102 12/07/2015   CHOLHDL 2.0 12/07/2015    Significant Diagnostic Results in last 30 days:  No results found.  Assessment/Plan 1. Constipation, unspecified constipation type   2. Insomnia, unspecified type   3. Essential hypertension   4. Orthostatic hypotension   5. Chronic combined systolic and diastolic congestive heart failure ([place])   6. Gastroesophageal reflux disease with esophagitis   7. Hypothyroidism, unspecified type   8. Osteoarthritis, unspecified osteoarthritis type, unspecified site   9. Urinary retention   10. Urinary tract infection without hematuria, site unspecified   11. AKI (acute kidney injury) ([place])   12. RLS (restless legs syndrome)   13. Idiopathic gout, unspecified chronicity, unspecified site   14. Depression with anxiety   15. Cough     Family/ staff Communication:   Labs/tests ordered:

## 2016-05-14 NOTE — Assessment & Plan Note (Signed)
Not sleeping well at night, increaseTrazodone 75mg  hs, prn Clonazepam 0.5mg  tid

## 2016-05-14 NOTE — Assessment & Plan Note (Signed)
Managed with daily Amitiza 20mcg qhs, prn MiraLax, prn Senna S 03/29/16 KUB no bowel obstruction 05/11/16 KUB showed constipation, no bowel obstruction 05/14/16 Mg Citrate 260ml po stat, change MiraLax to daily, continue Amtiza 7mcg qd, prn Senokot S

## 2016-05-15 ENCOUNTER — Telehealth: Payer: Self-pay | Admitting: Cardiovascular Disease

## 2016-05-15 NOTE — Telephone Encounter (Signed)
Returned the call to [facility]. They sent a copy of the weight gain log which was not taken daily. I asked her if the patient had been taking the PRN Torsemide which she stated that they did not have a prescription for that and it was not on his med list. I asked for a medication list to be faxed.

## 2016-05-15 NOTE — Telephone Encounter (Signed)
New message    Patient has weight gain. No chest pain, no sob. Will be faxing over weight gain log  3/22 - 176  3/27 - 164   4/1 - 178 4/11 - 171

## 2016-05-16 NOTE — Telephone Encounter (Signed)
Returned the phone call back to [place] at [facility] to inform her of Dr. Victorino December instructions:  I think that the weight gain may be appropriate. At his January office visits he weighed around 180 lb, then probably lost weight during his acute respiratory illness in February. I would tolerate weight gain up to and not to exceed 180 lb AS LONG AS he does not have dyspnea, orthopnea or uncomfortable leg edema. The extra fluid will help avoid his problems with orthostatic hypotension. Please record weight every day and call us if >180 lb or he becomes symptomatic. MCr  She stated that she understood and would call if needed.

## 2016-05-16 NOTE — Telephone Encounter (Signed)
I think that the weight gain may be appropriate. At his January office visits he weighed around 180 lb, then probably lost weight during his acute respiratory illness in February. I would tolerate weight gain up to and not to exceed 180 lb AS LONG AS he does not have dyspnea, orthopnea or uncomfortable leg edema. The extra fluid will help avoid his problems with orthostatic hypotension. Please record weight every day and call us if >180 lb or he becomes symptomatic. MCr

## 2016-05-21 ENCOUNTER — Telehealth: Payer: Self-pay | Admitting: *Deleted

## 2016-05-21 NOTE — Telephone Encounter (Signed)
Patient called and Left message stating that he needed something for sleep because he is not sleeping well and it is affecting his nerves.  I tried calling patient back and left message for him to speak with his nurse there so they can notify the Provider because he is in [place].

## 2016-06-08 ENCOUNTER — Other Ambulatory Visit: Payer: Self-pay | Admitting: Internal Medicine

## 2016-06-11 ENCOUNTER — Non-Acute Institutional Stay: Payer: Medicare Other | Admitting: Internal Medicine

## 2016-06-11 ENCOUNTER — Encounter: Payer: Self-pay | Admitting: Internal Medicine

## 2016-06-11 VITALS — BP 104/62 | HR 70 | Temp 97.4°F | Ht 71.0 in | Wt 176.0 lb

## 2016-06-11 DIAGNOSIS — Z8673 Personal history of transient ischemic attack (TIA), and cerebral infarction without residual deficits: Secondary | ICD-10-CM

## 2016-06-11 DIAGNOSIS — I5042 Chronic combined systolic (congestive) and diastolic (congestive) heart failure: Secondary | ICD-10-CM

## 2016-06-11 DIAGNOSIS — I255 Ischemic cardiomyopathy: Secondary | ICD-10-CM

## 2016-06-11 DIAGNOSIS — L89152 Pressure ulcer of sacral region, stage 2: Secondary | ICD-10-CM

## 2016-06-11 DIAGNOSIS — I10 Essential (primary) hypertension: Secondary | ICD-10-CM | POA: Diagnosis not present

## 2016-06-11 NOTE — Progress Notes (Signed)
Facility  [facility]    Place of Service: Clinic (12)     Allergies  Allergen Reactions  . Altace [Ramipril] Cough  . Crestor [Rosuvastatin Calcium] Rash  . Penicillins Rash and Other (See Comments)    Has patient had a PCN reaction causing immediate rash, facial/tongue/throat swelling, SOB or lightheadedness with hypotension: No Has patient had a PCN reaction causing severe rash involving mucus membranes or skin necrosis: Yes Has patient had a PCN reaction that required hospitalization Yes Has patient had a PCN reaction occurring within the last 10 years: No If all of the above answers are "NO", then may proceed with Cephalosporin use.   . Sulfa Antibiotics Rash    Chief Complaint  Patient presents with  . Acute Visit    sore spot on buttock, both sides and middle for 4 days. Here with wife and Adam [name].     HPI:  Returned to apt from SNF on 06/06/16, where he was admitted 03/14/16 following hospitalization for influenza A.  Essential hypertension - controlled  Decubitus ulcer of sacral region, stage 2 - small 7 mm healing decubitus of the right buttock.  History of arterial ischemic stroke - improved   Cardiomyopathy, ischemic - stable  Chronic combined systolic and diastolic congestive heart failure ([place]) - compensated    Medications: Patient's Medications  New Prescriptions   No medications on file  Previous Medications   ACETAMINOPHEN (TYLENOL) 325 MG TABLET    Take by mouth. Take 2 tablets every four hours for fever greater than 100.3 x 24 hours   AMIODARONE (PACERONE) 200 MG TABLET    Take 400 mg by mouth daily.   ASPIRIN EC 81 MG TABLET    Take 1 tablet (81 mg total) by mouth daily.   CHOLECALCIFEROL (VITAMIN D3) 5000 UNITS CAPS    Take 5,000 Units by mouth every Monday.    CLOBETASOL (TEMOVATE) 0.05 % EXTERNAL SOLUTION    Apply 1 application topically 2 (two) times daily.   CLONAZEPAM (KLONOPIN) 0.5 MG TABLET    Take 0.5 mg by mouth 3 (three) times daily  as needed for anxiety.   CLOPIDOGREL (PLAVIX) 75 MG TABLET    Take 1 tablet (75 mg total) by mouth daily.   FLUDROCORTISONE (FLORINEF) 0.1 MG TABLET    One daily to help supporrt BP   LEVOTHYROXINE (SYNTHROID, LEVOTHROID) 137 MCG TABLET    Take 1 tablet (137 mcg total) by mouth daily before breakfast.   LUBIPROSTONE (AMITIZA) 24 MCG CAPSULE    Take 24 mcg by mouth at bedtime.    MELOXICAM (MOBIC) 7.5 MG TABLET    Take 7.5 mg by mouth. Take one tablet daily   MIDODRINE (PROAMATINE) 5 MG TABLET    Take 5 mg by mouth 3 (three) times daily with meals. Take one tablet twice a day (12:00 and 4:00)   PANTOPRAZOLE (PROTONIX) 40 MG TABLET    Take 40 mg by mouth at bedtime.   POLYETHYLENE GLYCOL (MIRALAX / GLYCOLAX) PACKET    Take 17 g by mouth daily as needed for mild constipation.   PRAVASTATIN (PRAVACHOL) 40 MG TABLET    Take 1 tablet by mouth daily.   ROPINIROLE (REQUIP) 0.5 MG TABLET    Take 0.5 mg by mouth 3 (three) times daily as needed (for restless leg syndrome).   SENNA-DOCUSATE (SENOKOT-S) 8.6-50 MG TABLET    Take 4 tablets by mouth daily as needed for mild constipation.   TAMSULOSIN (FLOMAX) 0.4 MG CAPS CAPSULE  TAKE ONE CAPSULE DAILY TO HELP IMPROVE URINARY FREQUENCY.   TORSEMIDE (DEMADEX) 20 MG TABLET    Take 20 mg by mouth daily as needed (for weight gain of 3 or more lbs).    TRAZODONE (DESYREL) 50 MG TABLET    Take 50 mg by mouth at bedtime.   TRIAMCINOLONE CREAM (KENALOG) 0.1 %    Apply 1 application topically 2 (two) times daily as needed (for irritation).  Modified Medications   No medications on file  Discontinued Medications   No medications on file     Review of Systems  Constitutional: Positive for appetite change and fatigue. Negative for activity change, fever and unexpected weight change.  HENT: Positive for hearing loss and tinnitus. Negative for congestion, ear pain, rhinorrhea, sore throat, trouble swallowing and voice change.   Eyes: Negative for photophobia and pain.        Corrective lenses  Respiratory: Positive for cough. Negative for choking, chest tightness, shortness of breath and wheezing.   Cardiovascular: Positive for leg swelling (2+ and usingcompression hosiery). Negative for chest pain and palpitations.       Congestive cardiomyopathy. Coronary artery disease, status post CABG. Cardiac arrhythmias, status post pacemaker implanted left upper chest.  Gastrointestinal: Positive for constipation and nausea. Negative for abdominal distention, abdominal pain, diarrhea and vomiting.  Endocrine: Negative for cold intolerance, heat intolerance, polydipsia, polyphagia and polyuria.  Genitourinary: Positive for enuresis and frequency. Negative for dysuria, hematuria, testicular pain and urgency.       Double voids. Urgency. Foley  Musculoskeletal: Positive for gait problem (using rolling walker). Negative for arthralgias, back pain, myalgias and neck pain.       Ambulates with walker.   Skin: Positive for wound (smal decubitus right buttock. painful.). Negative for color change, pallor and rash (both arms. Seborrhea capitis.).       The left 2nd toe healed. Buttock wound is healed.   Allergic/Immunologic: Negative.   Neurological: Positive for dizziness, speech difficulty (sometimes has difficulty with word finding) and weakness. Negative for tremors, syncope, numbness and headaches.       MRI brain disclosed cerebral atrophy and microvascular disease.  Insomnia and restless legs are improved. Patient reports that he has H/O dizziness. This makes the patient feel unbalanced. R hand numbness.  Cognitive decline since 2014. Restless legs day and night.  Hematological: Negative for adenopathy. Does not bruise/bleed easily.  Psychiatric/Behavioral: Positive for sleep disturbance (nocturia x 7). Negative for behavioral problems, confusion, decreased concentration, hallucinations and suicidal ideas. The patient is not nervous/anxious.        Not enough sleep,  decreased energy     Vitals:   06/11/16 0915  BP: 104/62  Pulse: 70  Temp: 97.4 F (36.3 C)  TempSrc: Oral  SpO2: 96%  Weight: 176 lb (79.8 kg)  Height: '5\' 11"'  (1.803 m)   Wt Readings from Last 3 Encounters:  06/11/16 176 lb (79.8 kg)  05/14/16 174 lb (78.9 kg)  05/07/16 172 lb (78 kg)    Body mass index is 24.55 kg/m.  Physical Exam  Constitutional: He is oriented to person, place, and time. He appears well-developed and well-nourished. No distress.  Frail.  HENT:  Head: Normocephalic and atraumatic.  Right Ear: External ear normal.  Left Ear: External ear normal.  Nose: Nose normal.  Mouth/Throat: Oropharynx is clear and moist. No oropharyngeal exudate.  Eyes: Conjunctivae and EOM are normal. Pupils are equal, round, and reactive to light. Right eye exhibits no discharge. Left eye exhibits  no discharge.  Neck: Normal range of motion. Neck supple. No JVD present. No tracheal deviation present. No thyromegaly present.  Cardiovascular: Normal rate, regular rhythm and intact distal pulses.  Exam reveals no gallop and no friction rub.   Murmur heard. 6-2/5 systolic  Pulmonary/Chest: Effort normal and breath sounds normal. No respiratory distress. He has no wheezes. He has no rales. He exhibits no tenderness.  Abdominal: He exhibits no distension and no mass. There is no tenderness.  abd hernia  Genitourinary: No penile tenderness.  Genitourinary Comments: Foley(failed removal)  Musculoskeletal: Normal range of motion. He exhibits edema (trace to 1+ bipedal). He exhibits no tenderness.  Gait disturbance and loss of balance. Patient arrives in a  wheelchair today.  Lymphadenopathy:    He has no cervical adenopathy.  Neurological: He is alert and oriented to person, place, and time. He has normal reflexes. No cranial nerve deficit. He exhibits normal muscle tone. Coordination normal.  Skin: Skin is warm and dry. No rash noted. No erythema. No pallor.  Seborrhea of the  scalp. Small decubitus of the right buttock  Psychiatric: He has a normal mood and affect. His behavior is normal. Judgment and thought content normal.     Labs reviewed: Lab Summary Latest Ref Rng & Units 05/08/2016 04/03/2016 03/11/2016 03/10/2016  Hemoglobin 13.5 - 17.5 g/dL 14.8 13.5 12.0(L) 13.1  Hematocrit 41 - 53 % 44 40(A) 36.9(L) 39.5  White count 10:3/mL 9.2 5.6 6.3 8.3  Platelet count 150 - 399 K/L 280 225 176 190  Sodium 137 - 147 mmol/L 142 138 139 140  Potassium 3.4 - 5.3 mmol/L 4.1 4.6 3.7 3.5  Calcium 8.9 - 10.3 mg/dL (None) (None) 8.0(L) 8.7(L)  Phosphorus - (None) (None) (None) (None)  Creatinine 0.6 - 1.3 mg/dL 0.8 0.7 0.72 1.03  AST 14 - 40 U/L 20 15 46(H) 53(H)  Alk Phos 25 - 125 U/L 78 60 51 61  Bilirubin 0.3 - 1.2 mg/dL (None) (None) 0.4 0.6  Glucose mg/dL 98 84 105(H) 96  Cholesterol - (None) (None) (None) (None)  HDL cholesterol - (None) (None) (None) (None)  Triglycerides - (None) (None) (None) (None)  LDL Direct - (None) (None) (None) (None)  LDL Calc - (None) (None) (None) (None)  Total protein 6.5 - 8.1 g/dL (None) (None) 5.8(L) 6.9  Albumin 3.5 - 5.0 g/dL (None) (None) 3.1(L) 3.7  Some recent data might be hidden   Lab Results  Component Value Date   TSH 1.96 05/08/2016   Lab Results  Component Value Date   BUN 13 05/08/2016   BUN 15 04/03/2016   BUN 18 03/11/2016   Lab Results  Component Value Date   CREATININE 0.8 05/08/2016   CREATININE 0.7 04/03/2016   CREATININE 0.72 03/11/2016   Lab Results  Component Value Date   HGBA1C 5.6 12/07/2015   HGBA1C 5.9 04/20/2015   HGBA1C 5.8 02/16/2015       Assessment/Plan  1. Decubitus ulcer of sacral region, stage 2 Pressure relief with Tush Cush and hydrocolloid bandages.  2. Essential hypertension controlled  3. History of arterial ischemic stroke Speech is better  4. Cardiomyopathy, ischemic stable  5. Chronic combined systolic and diastolic congestive heart failure  (HCC) Compensated.

## 2016-06-18 ENCOUNTER — Encounter: Payer: Self-pay | Admitting: Nurse Practitioner

## 2016-06-18 NOTE — Progress Notes (Signed)
This encounter was created in error - please disregard.

## 2016-06-20 ENCOUNTER — Telehealth: Payer: Self-pay

## 2016-06-20 NOTE — Telephone Encounter (Signed)
Adam [name] is also on medication list, is he taking this?

## 2016-06-20 NOTE — Telephone Encounter (Signed)
Discussed response with Mrs.Crocket, as previously noted patient without abdominal pain. Patient denies abdominal distention or fever, if symptoms develop patient will call for appointment.   Medication list updated to reflect change in Amitiza

## 2016-06-20 NOTE — Telephone Encounter (Signed)
May increase amitiza to twice daily to give with food and water, make sure he is not having any abdominal pain, abdominal distension, fevers.  May need to be seen if he is having any of these symptoms

## 2016-06-20 NOTE — Telephone Encounter (Addendum)
I called patient's wife to confirm patient taking Amitiza 24 mcg, confirmed. Patient with no changes in eating habits, gets fiber in diet on a regular. Patient overall eats a well balanced diet and drinks plenty of water   Please advise

## 2016-06-20 NOTE — Telephone Encounter (Signed)
[facility] Patient   Patient's wife called on patient's behalf, patient c/o no bowel movement in 3 days. Patient is taking 4 senokot's and miralax daily. Patient denies abdominal pain.  Please advise   Last OV 06/11/16 (Dr.Green)

## 2016-06-21 ENCOUNTER — Other Ambulatory Visit: Payer: Self-pay | Admitting: *Deleted

## 2016-06-21 DIAGNOSIS — I951 Orthostatic hypotension: Secondary | ICD-10-CM

## 2016-06-21 MED ORDER — FLUDROCORTISONE ACETATE 0.1 MG PO TABS: ORAL_TABLET | ORAL | 5 refills | Status: DC

## 2016-06-21 NOTE — Telephone Encounter (Signed)
[place] Wife requested refill.

## 2016-06-27 ENCOUNTER — Telehealth: Payer: Self-pay

## 2016-06-27 MED ORDER — SENNOSIDES-DOCUSATE SODIUM 8.6-50 MG PO TABS: 4.0000 | ORAL_TABLET | Freq: Every day | ORAL | 5 refills | Status: DC | PRN

## 2016-06-27 NOTE — Telephone Encounter (Signed)
Message left on clinical intake voicemail:   Patient's wife called on patient's behalf. Patient with no BM x 4 days, taking 4 senokot daily and miralax as needed  Patient with abdominal pain earlier today that subsided. No abdominal the other days. Patient eats foods high in fiber, no new medications  Please advise

## 2016-06-27 NOTE — Telephone Encounter (Signed)
Patient's wife called back and stated patient is only 2 Senokot daily for the last 4 days because he was low on supply. Patient's wife apologized for providing inaccurate information on previous call.  Please advise

## 2016-06-27 NOTE — Telephone Encounter (Signed)
Spoke with patient's wife, rx for Senokot sent to the pharmacy. Patient will take miralax daily vs as needed (medication list updated)   Constipation regimen (clarified with Mrs.Whitlatch and she wrote it down) Amitiza 24 mcg, 1 by mouth twice Senokot-S 8.6-50, 4 tablets daily as needed Miralax 17g, once daily

## 2016-06-27 NOTE — Telephone Encounter (Signed)
Increase fluids, to use miralax 17 gm daily and cont senokot

## 2016-07-30 ENCOUNTER — Telehealth: Payer: Self-pay | Admitting: Cardiovascular Disease

## 2016-07-30 NOTE — Telephone Encounter (Signed)
Returned call to patient's daughter Caryl Asp.She stated father's B/P low ranging 85/56,80/48,97/62,91/61.Pulse 70.Stated he is weak.Taking Proamatine 10 mg am,5 mg noon,5 mg pm.He is drinking and eating ok.Spoke with DOD Dr.Berry he advised continue same medications.Appointment scheduled with Almyra Deforest PA 08/06/16 3:30 pm.Advised to go to ED if needed.

## 2016-07-30 NOTE — Telephone Encounter (Signed)
Please call,pt's blood pressure is really low,it is 80/48. He is really dizzy and weak,not able to walk for the last few days.

## 2016-07-31 ENCOUNTER — Encounter: Payer: Self-pay | Admitting: Internal Medicine

## 2016-07-31 ENCOUNTER — Emergency Department (HOSPITAL_COMMUNITY)
Admission: EM | Admit: 2016-07-31 | Discharge: 2016-08-01 | Disposition: A | Discharge status: Home / Self Care | Payer: Medicare Other | Attending: Emergency Medicine | Admitting: Emergency Medicine

## 2016-07-31 ENCOUNTER — Non-Acute Institutional Stay: Payer: Medicare Other | Admitting: Internal Medicine

## 2016-07-31 VITALS — BP 110/80 | HR 70 | Temp 97.4°F | Resp 16 | Ht 71.0 in | Wt 182.4 lb

## 2016-07-31 DIAGNOSIS — Z950 Presence of cardiac pacemaker: Secondary | ICD-10-CM | POA: Diagnosis not present

## 2016-07-31 DIAGNOSIS — Z8673 Personal history of transient ischemic attack (TIA), and cerebral infarction without residual deficits: Secondary | ICD-10-CM | POA: Diagnosis not present

## 2016-07-31 DIAGNOSIS — Z7982 Long term (current) use of aspirin: Secondary | ICD-10-CM | POA: Insufficient documentation

## 2016-07-31 DIAGNOSIS — R05 Cough: Secondary | ICD-10-CM

## 2016-07-31 DIAGNOSIS — R319 Hematuria, unspecified: Secondary | ICD-10-CM

## 2016-07-31 DIAGNOSIS — Z951 Presence of aortocoronary bypass graft: Secondary | ICD-10-CM | POA: Insufficient documentation

## 2016-07-31 DIAGNOSIS — I110 Hypertensive heart disease with heart failure: Secondary | ICD-10-CM | POA: Diagnosis not present

## 2016-07-31 DIAGNOSIS — R300 Dysuria: Secondary | ICD-10-CM

## 2016-07-31 DIAGNOSIS — I2510 Atherosclerotic heart disease of native coronary artery without angina pectoris: Secondary | ICD-10-CM | POA: Diagnosis not present

## 2016-07-31 DIAGNOSIS — N3001 Acute cystitis with hematuria: Secondary | ICD-10-CM | POA: Insufficient documentation

## 2016-07-31 DIAGNOSIS — Z955 Presence of coronary angioplasty implant and graft: Secondary | ICD-10-CM | POA: Diagnosis not present

## 2016-07-31 DIAGNOSIS — I5040 Unspecified combined systolic (congestive) and diastolic (congestive) heart failure: Secondary | ICD-10-CM | POA: Insufficient documentation

## 2016-07-31 DIAGNOSIS — Z7902 Long term (current) use of antithrombotics/antiplatelets: Secondary | ICD-10-CM | POA: Insufficient documentation

## 2016-07-31 DIAGNOSIS — Z79899 Other long term (current) drug therapy: Secondary | ICD-10-CM | POA: Insufficient documentation

## 2016-07-31 DIAGNOSIS — R059 Cough, unspecified: Secondary | ICD-10-CM

## 2016-07-31 DIAGNOSIS — K5900 Constipation, unspecified: Secondary | ICD-10-CM

## 2016-07-31 DIAGNOSIS — N390 Urinary tract infection, site not specified: Secondary | ICD-10-CM | POA: Diagnosis present

## 2016-07-31 MED ORDER — PHENAZOPYRIDINE HCL 100 MG PO TABS: 100.0000 mg | ORAL_TABLET | Freq: Three times a day (TID) | ORAL | 0 refills | Status: AC | PRN

## 2016-07-31 NOTE — ED Triage Notes (Signed)
Pt reports low abd pelvic discomfort that occassional turns to pain waking him from sleep. Pt also report see blood in his foley catheter intermittantly

## 2016-07-31 NOTE — Progress Notes (Signed)
[facility]   Provider: Blanchie Serve  Goals of Care:  Advanced Directives 06/11/2016  Does Patient Have a Medical Advance Directive? Yes  Type of Paramedic of [place];Living will  Does patient want to make changes to medical advance directive? -  Copy of [place] in Chart? Yes  Would patient like information on creating a medical advance directive? -  Pre-existing out of facility DNR order (yellow form or pink MOST form) -     Chief Complaint  Patient presents with  . Medical Management of Chronic Issues    follow up for dry cough. patient stated that is has improved some since we spoke last week. stated that he has a hard time clearing his throat. went to urgent care a week ago and was given cipro x 7 days for UTI. completed taking the cipro. concerns of low blood pressure( spoke with cardiology).   Marland Kitchen other    patient has had some dizziness and some diarrhea, moments of weakness for the past couple of days.     HPI: Patient is a 83 y.o. male seen today for an acute visit for worsening cough x 1 week. Cough is mostly dry at present. It was initially productive and mucinex and claritin helped some. Cough woke him up at night with occasional feeling of shortness of breath. Denies hemoptysis. Denies fever or chills. Never been a smoker. No known history of asthma or chronic lung disease. No wheezing reported.  He was started on antibiotic for UTI 1 week back with complaints of hematuria with burning and pain. He completed his antibiotic on 07/26/16. He still has some burning and pain with urination. He has noticed blood in his urine last night. He has chronic foley catheter with urinary retention  Past Medical History:  Diagnosis Date  . Arthritis    "minor, back and sometimes knees" (12/15/2012)  . Bradycardia    AFib/SSS s/p St Jude PPM 04/12/2008  . CAD (coronary artery disease) 12/30/2014   CABG (LIMA-LAD, SVG-RCA, SVG-OM in  1996).  07/2009 BMS to SVG-RCA. Cath in 04/2010 with patent stents   . Cardiomyopathy, ischemic 08/25/2012  . CHF (congestive heart failure) ([place])   . Chronic knee pain 12/03/2014  . Combined congestive systolic and diastolic heart failure ([place]) 02/02/2015   Hx EF 41%. BNP 96.8 02/21/15 Torsemide 04/06/15 Na 142, K 4.6, Bun 16, creat 0.89 04/20/14 BNP 111.7, Na 142, K 4.6, Bun 16, creat 0.9   . Depression with anxiety 02/02/2015   02/21/15 Hgb A1c 5.8 03/10/15 MMSE 30/30   . Dizziness, after diuretic asscoiated with hypotension and responded to fluid bolus 06/05/2011   04/28/15 US carotid R+L normal bilateral arterial velocities.    Marland Kitchen Dyspnea 08/11/2014   Followed in Pulmonary clinic/ [facility]/ [place]  - 08/11/2014  Walked RA x 1 laps @ 185 ft each stopped due to fatigue/off balance/ slow pace/  no sob or desat  - PFT's  09/26/2014  FEV1 2.26 (85 % ) ratio 76  p no % improvement from saba with DLCO  67 % corrects to 93 % for alv volume      Since prev study 08/04/13 minimal change lung vol or dlco    . Embolic cerebral infarction ([place]) 12/06/2015  . Exertional shortness of breath    "sometimes walking" (12/15/2012)  . GERD (gastroesophageal reflux disease)   . Gout 02/09/2015  . Heart murmur    "just told I had one today" (  12/15/2012)  . Hiatal hernia   . Hyperlipidemia   . Hypertension   . Hypothyroid   . Influenza A 03/10/2016  . Insomnia   . Melanoma of back ([place]) 1976  . Myocardial infarction [facility]) 1996; 2011   "both silent" (12/15/2012)  . Nonrheumatic aortic valve stenosis   . Orthostatic hypotension   . Osteoporosis, senile   . Pacemaker   . RBBB   . Restless leg 02/02/2015  . Right leg weakness 12/06/2015  . [facility] spotted fever   . S/P CABG x 4   . Sick sinus syndrome ([place]) 01/31/2014  . Sustained ventricular tachycardia ([place]) 07/27/2014  . Urinary retention 12/30/2014    Past Surgical History:  Procedure Laterality Date  . CARDIAC CATHETERIZATION  04/2010   LIMA to LAD  patent,SVG to OM patent,no in-stnet restenosis RCA  . CATARACT EXTRACTION W/ INTRAOCULAR LENS  IMPLANT, BILATERAL Bilateral 2012  . CORONARY ANGIOPLASTY WITH STENT PLACEMENT  07/2009   bare metal stent to SVG to the RCA  . CORONARY ARTERY BYPASS GRAFT  1996   LIMA to LAD,SVG to RCA & SVG to OM  . INSERT / REPLACE / REMOVE PACEMAKER  2010  . MELANOMA EXCISION  05/1974 X2   "taken off my back" (12/15/2012)  . NM MYOVIEW LTD  06/2011   low risk  . TONSILLECTOMY  1938  . TRANSURETHRAL RESECTION OF PROSTATE  1986  . US ECHOCARDIOGRAPHY  07/11/2009   EF 45-50%    Allergies  Allergen Reactions  . Altace [Ramipril] Cough  . Crestor [Rosuvastatin Calcium] Rash  . Penicillins Rash and Other (See Comments)    Has patient had a PCN reaction causing immediate rash, facial/tongue/throat swelling, SOB or lightheadedness with hypotension: No Has patient had a PCN reaction causing severe rash involving mucus membranes or skin necrosis: Yes Has patient had a PCN reaction that required hospitalization Yes Has patient had a PCN reaction occurring within the last 10 years: No If all of the above answers are "NO", then may proceed with Cephalosporin use.   . Sulfa Antibiotics Rash    Allergies as of 07/31/2016      Reactions   Altace [ramipril] Cough   Crestor [rosuvastatin Calcium] Rash   Penicillins Rash, Other (See Comments)   Has patient had a PCN reaction causing immediate rash, facial/tongue/throat swelling, SOB or lightheadedness with hypotension: No Has patient had a PCN reaction causing severe rash involving mucus membranes or skin necrosis: Yes Has patient had a PCN reaction that required hospitalization Yes Has patient had a PCN reaction occurring within the last 10 years: No If all of the above answers are "NO", then may proceed with Cephalosporin use.   Sulfa Antibiotics Rash      Medication List       Accurate as of 07/31/16 12:11 PM. Always use your most recent med list.            acetaminophen 325 MG tablet Commonly known as:  TYLENOL Take 650 mg by mouth as needed. Take 2 tablets for fever greater than 100.3   amiodarone 200 MG tablet Commonly known as:  PACERONE Take 400 mg by mouth daily.   aspirin EC 81 MG tablet Take 1 tablet (81 mg total) by mouth daily.   clobetasol 0.05 % external solution Commonly known as:  TEMOVATE Apply 1 application topically as needed.   clopidogrel 75 MG tablet Commonly known as:  PLAVIX Take 1 tablet (75 mg total) by mouth daily.   fludrocortisone  0.1 MG tablet Commonly known as:  FLORINEF One daily to help supporrt BP   guaiFENesin 600 MG 12 hr tablet Commonly known as:  MUCINEX Take 600 mg by mouth daily.   levothyroxine 137 MCG tablet Commonly known as:  SYNTHROID, LEVOTHROID Take 1 tablet (137 mcg total) by mouth daily before breakfast.   loratadine 10 MG tablet Commonly known as:  CLARITIN Take 10 mg by mouth daily.   midodrine 10 MG tablet Commonly known as:  PROAMATINE Take 10 mg by mouth every morning.   midodrine 5 MG tablet Commonly known as:  PROAMATINE Take 5 mg by mouth 2 (two) times daily. Take at 12 pm and 4 pm   pantoprazole 40 MG tablet Commonly known as:  PROTONIX Take 40 mg by mouth at bedtime.   polyethylene glycol packet Commonly known as:  MIRALAX / GLYCOLAX Take 17 g by mouth 2 (two) times daily.   pravastatin 40 MG tablet Commonly known as:  PRAVACHOL Take 1 tablet by mouth daily.   rOPINIRole 0.5 MG tablet Commonly known as:  REQUIP Take 0.5 mg by mouth 3 (three) times daily as needed (for restless leg syndrome).   tamsulosin 0.4 MG Caps capsule Commonly known as:  FLOMAX TAKE ONE CAPSULE DAILY TO HELP IMPROVE URINARY FREQUENCY.   traZODone 50 MG tablet Commonly known as:  DESYREL Take 50 mg by mouth at bedtime.   Vitamin D3 5000 units Caps Take 5,000 Units by mouth every Monday.       Review of Systems:  Review of Systems  Constitutional: Negative for  appetite change, chills and fever.  HENT: Positive for hearing loss and postnasal drip. Negative for congestion, mouth sores, rhinorrhea, sinus pain, sinus pressure, sore throat and trouble swallowing.   Respiratory: Positive for cough. Negative for choking, shortness of breath and wheezing.   Cardiovascular: Negative for chest pain and palpitations.  Gastrointestinal: Negative for abdominal pain, blood in stool, nausea and vomiting.  Genitourinary: Positive for hematuria.  Musculoskeletal: Negative for back pain.       Uses a walker  Skin: Negative for rash.  Neurological: Negative for dizziness, seizures and headaches.  Psychiatric/Behavioral: Negative for behavioral problems and confusion.    Health Maintenance  Topic Date Due  . PNA vac Low Risk Adult (2 of 2 - PCV13) 02/04/2017 (Originally 10/06/2010)  . TETANUS/TDAP  08/01/2026 (Originally 01/08/1946)  . INFLUENZA VACCINE  09/04/2016  . DEXA SCAN  12/14/2024    Physical Exam: Vitals:   07/31/16 1149  BP: 110/80  Pulse: 70  Resp: 16  Temp: 97.4 F (36.3 C)  TempSrc: Oral  SpO2: 96%  Weight: 182 lb 6.4 oz (82.7 kg)  Height: 5\' 11"  (1.803 m)   Body mass index is 25.44 kg/m. Physical Exam  Constitutional: He is oriented to person, place, and time. He appears well-developed and well-nourished. No distress.  HENT:  Head: Normocephalic and atraumatic.  Mouth/Throat: Oropharynx is clear and moist.  Eyes: Conjunctivae and EOM are normal. Pupils are equal, round, and reactive to light.  Neck: Normal range of motion. Neck supple.  Cardiovascular: Normal rate and regular rhythm.   Murmur heard. Pulmonary/Chest: Effort normal and breath sounds normal. No respiratory distress. He has no wheezes. He has no rales. He exhibits no tenderness.  Abdominal: Soft. Bowel sounds are normal. He exhibits distension. There is no tenderness. There is no rebound and no guarding.  Foley catheter in place with dark urine in bag  Musculoskeletal:  He exhibits no edema.  Uses a rolling walker, unsteady gait  Lymphadenopathy:    He has no cervical adenopathy.  Neurological: He is alert and oriented to person, place, and time.  Skin: Skin is warm and dry. He is not diaphoretic.  Stage 2 pressure ulcer to left sacrum  Psychiatric: He has a normal mood and affect.    Labs reviewed: Basic Metabolic Panel:  Recent Labs  11/27/15  01/11/16 0537 03/10/16 1443 03/11/16 0436 04/03/16 05/08/16  NA 141  < > 140 140 139 138 142  K 4.3  < > 4.1 3.5 3.7 4.6 4.1  CL  --   < > 106 104 108  --   --   CO2  --   < > 27 29 25   --   --   GLUCOSE  --   < > 97 96 105*  --   --   BUN 13  < > 13 22* 18 15 13   CREATININE 0.9  < > 0.79 1.03 0.72 0.7 0.8  CALCIUM  --   < > 8.8* 8.7* 8.0*  --   --   TSH 2.58  --   --   --   --   --  1.96  < > = values in this interval not displayed. Liver Function Tests:  Recent Labs  01/10/16 1400 03/10/16 1443 03/11/16 0436 04/03/16 05/08/16  AST 25 53* 46* 15 20  ALT 22 26 23 14 19   ALKPHOS 68 61 51 60 78  BILITOT 0.4 0.6 0.4  --   --   PROT 6.4* 6.9 5.8*  --   --   ALBUMIN 3.5 3.7 3.1*  --   --    No results for input(s): LIPASE, AMYLASE in the last 8760 hours. No results for input(s): AMMONIA in the last 8760 hours. CBC:  Recent Labs  12/06/15 1617 01/10/16 1400  01/11/16 0537 03/10/16 1443 03/11/16 0436 04/03/16 05/08/16  WBC 5.4 6.0  --  6.4 8.3 6.3 5.6 9.2  NEUTROABS 3.3 4.1  --   --  5.8  --   --   --   HGB 13.5 14.1  < > 13.9 13.1 12.0* 13.5 14.8  HCT 41.4 42.7  < > 42.2 39.5 36.9* 40* 44  MCV 98.6 97.7  --  97.2 95.6 95.8  --   --   PLT 183 190  --  188 190 176 225 280  < > = values in this interval not displayed. Lipid Panel:  Recent Labs  11/27/15 12/07/15 0449  CHOL 97 84  HDL 53 41  LDLCALC 25 23  TRIG 97 102  CHOLHDL  --  2.0   Lab Results  Component Value Date   HGBA1C 5.6 12/07/2015     Assessment/Plan  1. Cough Acute on chronic, normal oropharynx, clear lung  exam, normal cardiac exam, c/w guaifenesin 600 mg bid for now. Concern for GERD contributing to this. Increase dosing of protonix to 40 mg bid and reassess in 2 weeks.  - CBC with Differential/Platelets; Future  2. Dysuria Recent UTI and has completed antibiotic, symptom persists, recheck u/a with c/s. Start pyridium 100 mg tid prn for dysuria.  - phenazopyridine (PYRIDIUM) 100 MG tablet; Take 1 tablet (100 mg total) by mouth 3 (three) times daily as needed for pain.  Dispense: 10 tablet; Refill: 0 - Urinalysis with Reflex Microscopic; Future - Culture, Urine  3. Hematuria, unspecified type Possible another UTI. Denies flank pain. Afebrile.  - Urinalysis with Reflex Microscopic; Future -  Culture, Urine    Labs/tests ordered:  Cbc with diff, u/a with c/s  Next appt:  2 weeks or earlier if needed. Reviewed care plan with pt, his wife and daughter.   Blanchie Serve, MD Internal Medicine [facility] [address] [place], [place] [address] Cell Phone (Monday-Friday 8 am - 5 pm): [phone] On Call: [phone] and follow prompts after 5 pm and on weekends Office Phone: [phone] Office Fax: [phone]

## 2016-07-31 NOTE — ED Provider Notes (Signed)
[facility] DEPT Provider Note   CSN: [id number] Arrival date & time: 07/31/16  2047  By signing my name below, I, Adam [name], attest that this documentation has been prepared under the direction and in the presence of Adam Schmidt, MD. Electronically Signed: Theresia [name], ED Scribe. 08/01/16. 12:11 AM.  History   Chief Complaint Chief Complaint  Patient presents with  . Urinary Tract Infection   The history is provided by the patient. No language interpreter was used.   HPI Comments: Adam [name] is a 83 y.o. male with a PMHx of urinary retention, who presents to the Emergency Department complaining of gradually worsening, moderate pelvic pain onset 1 week ago. Pt reports associated dysuria, hematuria, diarrhea and abdominal swelling. Pt notes some leg and ankle swelling today as well. Pt uses a foley catheter and noticed blood in the bag today. Pt states his catheter is working properly. Pt was seen at [facility] on 07/22/16 for similar symptoms and was prescribed antibiotics. Pt's family states he has been eating and drinking fluids well. Pt denies fever, vomiting, confusion or any other complaints at this time.  Past Medical History:  Diagnosis Date  . Arthritis    "minor, back and sometimes knees" (12/15/2012)  . Bradycardia    AFib/SSS s/p St Jude PPM 04/12/2008  . CAD (coronary artery disease) 12/30/2014   CABG (LIMA-LAD, SVG-RCA, SVG-OM in 1996).  07/2009 BMS to SVG-RCA. Cath in 04/2010 with patent stents   . Cardiomyopathy, ischemic 08/25/2012  . CHF (congestive heart failure) ([place])   . Chronic knee pain 12/03/2014  . Combined congestive systolic and diastolic heart failure ([place]) 02/02/2015   Hx EF 41%. BNP 96.8 02/21/15 Torsemide 04/06/15 Na 142, K 4.6, Bun 16, creat 0.89 04/20/14 BNP 111.7, Na 142, K 4.6, Bun 16, creat 0.9   . Depression with anxiety 02/02/2015   02/21/15 Hgb A1c 5.8 03/10/15 MMSE 30/30   . Dizziness, after diuretic asscoiated with hypotension and responded to  fluid bolus 06/05/2011   04/28/15 US carotid R+L normal bilateral arterial velocities.    Marland Kitchen Dyspnea 08/11/2014   Followed in Pulmonary clinic/  Health[facility]/ [facility]  - 08/11/2014  Walked RA x 1 laps @ 185 ft each stopped due to fatigue/off balance/ slow pace/  no sob or desat  - PFT's  09/26/2014  FEV1 2.26 (85 % ) ratio 76  p no % improvement from saba with DLCO  67 % corrects to 93 % for alv volume      Since prev study 08/04/13 minimal change lung vol or dlco    . Embolic cerebral infarction ([place]) 12/06/2015  . Exertional shortness of breath    "sometimes walking" (12/15/2012)  . GERD (gastroesophageal reflux disease)   . Gout 02/09/2015  . Heart murmur    "just told I had one today" (12/15/2012)  . Hiatal hernia   . Hyperlipidemia   . Hypertension   . Hypothyroid   . Influenza A 03/10/2016  . Insomnia   . Melanoma of back ([place]) 1976  . Myocardial infarction [facility]) 1996; 2011   "both silent" (12/15/2012)  . Nonrheumatic aortic valve stenosis   . Orthostatic hypotension   . Osteoporosis, senile   . Pacemaker   . RBBB   . Restless leg 02/02/2015  . Right leg weakness 12/06/2015  . [facility] spotted fever   . S/P CABG x 4   . Sick sinus syndrome ([place]) 01/31/2014  . Sustained ventricular tachycardia ([place]) 07/27/2014  . Urinary retention 12/30/2014  Patient Active Problem List   Diagnosis Date Noted  . Decubitus ulcer of sacral region, stage 2 06/11/2016  . Osteoarthritis 04/23/2016  . Gingivitis 04/15/2016  . Cough 04/15/2016  . Weak 03/14/2016  . AKI (acute kidney injury) ([place]) 03/11/2016  . History of arterial ischemic stroke 03/05/2016  . Rash and nonspecific skin eruption 02/27/2016  . Nonrheumatic aortic valve stenosis   . TIA (transient ischemic attack) 01/10/2016  . Atypical chest pain 01/10/2016  . Hypotension 01/10/2016  . Hx of CABG 01/10/2016  . Chronic anticoagulation 12/22/2015  . Embolic cerebral infarction ([place]) 12/06/2015  . Urinary tract infection  04/06/2015  . Insomnia 03/23/2015  . Gout 02/09/2015  . Depression with anxiety 02/02/2015  . RLS (restless legs syndrome) 02/02/2015  . Combined congestive systolic and diastolic heart failure ([place]) 02/02/2015  . Urinary frequency 02/02/2015  . Pressure ulcer 01/01/2015  . Urinary retention 12/30/2014  . Coronary artery disease involving native coronary artery of native heart without angina pectoris 12/30/2014  . Expressive aphasia 12/30/2014  . Constipation   . Orthostatic hypotension   . Chronic knee pain 12/03/2014  . GERD (gastroesophageal reflux disease)   . Hypertension   . Dyspnea 08/11/2014  . Hypothyroidism 08/11/2014  . VT (ventricular tachycardia) ([place]) 07/27/2014  . SSS (sick sinus syndrome) ([place]) 01/31/2014  . Hyperlipidemia 01/30/2014  . Cardiomyopathy, ischemic 08/25/2012  . Pacemaker 08/25/2012  . Dizzy 06/05/2011  . Bradycardia     Past Surgical History:  Procedure Laterality Date  . CARDIAC CATHETERIZATION  04/2010   LIMA to LAD patent,SVG to OM patent,no in-stnet restenosis RCA  . CATARACT EXTRACTION W/ INTRAOCULAR LENS  IMPLANT, BILATERAL Bilateral 2012  . CORONARY ANGIOPLASTY WITH STENT PLACEMENT  07/2009   bare metal stent to SVG to the RCA  . CORONARY ARTERY BYPASS GRAFT  1996   LIMA to LAD,SVG to RCA & SVG to OM  . INSERT / REPLACE / REMOVE PACEMAKER  2010  . MELANOMA EXCISION  05/1974 X2   "taken off my back" (12/15/2012)  . NM MYOVIEW LTD  06/2011   low risk  . TONSILLECTOMY  1938  . TRANSURETHRAL RESECTION OF PROSTATE  1986  . US ECHOCARDIOGRAPHY  07/11/2009   EF 45-50%       Home Medications    Prior to Admission medications   Medication Sig Start Date End Date Taking? Authorizing Provider  acetaminophen (TYLENOL) 325 MG tablet Take 650 mg by mouth as needed. Take 2 tablets for fever greater than 100.3   Yes [provider]  amiodarone (PACERONE) 200 MG tablet Take 400 mg by mouth daily.   Yes [provider]  aspirin EC  81 MG tablet Take 1 tablet (81 mg total) by mouth daily. 01/12/16  Yes Reyne Dumas, MD  Cholecalciferol (VITAMIN D3) 5000 UNITS CAPS Take 5,000 Units by mouth every Monday.    Yes [provider]  clobetasol (TEMOVATE) 0.05 % external solution Apply 1 application topically as needed (scalp).    Yes [provider]  clopidogrel (PLAVIX) 75 MG tablet Take 1 tablet (75 mg total) by mouth daily. 01/12/16  Yes Reyne Dumas, MD  fludrocortisone (FLORINEF) 0.1 MG tablet One daily to help supporrt BP 06/21/16  Yes Estill Dooms, MD  levothyroxine (SYNTHROID, LEVOTHROID) 137 MCG tablet Take 1 tablet (137 mcg total) by mouth daily before breakfast. 01/05/16  Yes Gildardo Cranker, DO  midodrine (PROAMATINE) 5 MG tablet Take 5-10 mg by mouth 3 (three) times daily with meals. 10 Mg  in the AM, Take 5 MG at 12 pm and 4 pm   Yes [provider]  pantoprazole (PROTONIX) 40 MG tablet Take 40 mg by mouth 2 (two) times daily.   Yes [provider]  polyethylene glycol (MIRALAX / GLYCOLAX) packet Take 17 g by mouth 2 (two) times daily.    Yes [provider]  pravastatin (PRAVACHOL) 40 MG tablet Take 1 tablet by mouth daily. 03/22/16  Yes Estill Dooms, MD  rOPINIRole (REQUIP) 0.5 MG tablet Take 0.5 mg by mouth 3 (three) times daily as needed (for restless leg syndrome).   Yes [provider]  senna (SENOKOT) 8.6 MG TABS tablet Take 2-4 tablets by mouth daily as needed for mild constipation.   Yes [provider]  tamsulosin (FLOMAX) 0.4 MG CAPS capsule TAKE ONE CAPSULE DAILY TO HELP IMPROVE URINARY FREQUENCY. 06/10/16  Yes Estill Dooms, MD  traZODone (DESYREL) 50 MG tablet Take 50 mg by mouth at bedtime.   Yes [provider]  phenazopyridine (PYRIDIUM) 100 MG tablet Take 1 tablet (100 mg total) by mouth 3 (three) times daily as needed for pain. 07/31/16 08/03/16  Blanchie Serve, MD    Family History Family History  Problem Relation Age of Onset    . Coronary artery disease Mother   . Diabetes Mother   . Heart disease Mother   . Coronary artery disease Father   . Diabetes Father   . Lung cancer Father   . Heart disease Father   . Anuerysm Son   . Heart disease Brother     Social History Social History  Substance Use Topics  . Smoking status: Never Smoker  . Smokeless tobacco: Never Used  . Alcohol use No     Allergies   Altace [ramipril]; Crestor [rosuvastatin calcium]; Penicillins; and Sulfa antibiotics   Review of Systems Review of Systems All other systems reviewed and are negative for acute change except as noted in the HPI.  Physical Exam Updated Vital Signs BP 136/79 (BP Location: Right Arm)   Pulse 69   Temp 98.6 F (37 C) (Oral)   Resp 16   SpO2 93%   Physical Exam  Constitutional: He is oriented to person, place, and time. He appears well-developed and well-nourished.  HENT:  Head: Normocephalic and atraumatic.  Eyes: EOM are normal.  Neck: Normal range of motion.  Cardiovascular: Normal rate, regular rhythm, normal heart sounds and intact distal pulses.   Pulmonary/Chest: Effort normal and breath sounds normal. No respiratory distress.  Abdominal: Soft. He exhibits distension. There is no tenderness.  Genitourinary:  Genitourinary Comments: Chronic foley catheter in place with tea colored urine in the bag.    Musculoskeletal: Normal range of motion.  Neurological: He is alert and oriented to person, place, and time.  Skin: Skin is warm and dry.  Psychiatric: He has a normal mood and affect. Judgment normal.  Nursing note and vitals reviewed.    ED Treatments / Results  DIAGNOSTIC STUDIES: Oxygen Saturation is 96% on RA, normal by my interpretation.   COORDINATION OF CARE: 12:06 AM-Discussed next steps with pt including CT, blood work and UC. Pt verbalized understanding and is agreeable with the plan.   Labs (all labs ordered are listed, but only abnormal results are displayed) Labs  Reviewed  CBC - Abnormal; Notable for the following:       Result Value   RBC 4.06 (*)    Hemoglobin 12.9 (*)    HCT 38.3 (*)  RDW 16.3 (*)    All other components within normal limits  BASIC METABOLIC PANEL - Abnormal; Notable for the following:    Glucose, Bld 104 (*)    Calcium 8.5 (*)    All other components within normal limits  URINE CULTURE    EKG  EKG Interpretation None       Radiology Ct Abdomen Pelvis W Contrast  Result Date: 08/01/2016 CLINICAL DATA:  83 y/o M; increasing lower abdominal pressure, chronic foley, abd distention. no nausea or vomiting. EXAM: CT ABDOMEN AND PELVIS WITH CONTRAST TECHNIQUE: Multidetector CT imaging of the abdomen and pelvis was performed using the standard protocol following bolus administration of intravenous contrast. CONTRAST:  100 cc Isovue-300 COMPARISON:  12/29/2014 CT of the abdomen and pelvis. FINDINGS: Lower chest: Large hiatal hernia containing the entirety of the stomach. Partially visualized pacemaker leads in right atrium and ventricle. Mitral valvular calcification. Hepatobiliary: No focal liver abnormality is seen. No gallstones, gallbladder wall thickening, or biliary dilatation. Pancreas: Unremarkable. No pancreatic ductal dilatation or surrounding inflammatory changes. Spleen: Normal in size without focal abnormality. Adrenals/Urinary Tract: Stable bilateral renal cysts the largest in the left lower pole measuring up to 4.9 cm. No urinary stone disease or hydronephrosis. The bladder is collapsed around the Foley catheter. Diffuse bladder wall thickening. Stomach/Bowel: No obstructive or inflammatory changes of the bowel. Large volume of stool throughout the colon. Vascular/Lymphatic: 14 mm penetrating ulcer directed posteriorly and inferiorly from the left lateral wall of infrarenal abdominal aorta (series 4, image 8). Severe calcific atherosclerosis of the abdominal aorta. Reproductive: Prostate is unremarkable. Other: No  abdominal wall hernia or abnormality. No abdominopelvic ascites. Musculoskeletal: Advanced degenerative changes of the visible thoracic and lumbar spine. No acute osseous abnormality is identified. Mild bilateral hip osteoarthrosis. IMPRESSION: 1. Bladder collapsed around the Foley catheter. Bladder wall thickening more than expected for underdistention, this may represent cystitis or be due to prior hypertrophy. 2. Large volume of stool in the colon, probably constipation. 3. Large hiatal hernia containing the entirety of stomach. 4. 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 Electronically Signed   By: Kristine Garbe M.D.   On: 08/01/2016 03:12    Procedures Procedures (including critical care time)  Medications Ordered in ED Medications  morphine 4 MG/ML injection 4 mg (4 mg Intravenous Given 08/01/16 0333)  rOPINIRole (REQUIP) tablet 0.5 mg (0.5 mg Oral Given 08/01/16 0216)  iopamidol (ISOVUE-300) 61 % injection 100 mL (100 mLs Intravenous Contrast Given 08/01/16 0223)  cefTRIAXone (ROCEPHIN) 1 g in dextrose 5 % 50 mL IVPB (1 g Intravenous New Bag/Given 08/01/16 0333)     Initial Impression / Assessment and Plan / ED Course  I have reviewed the triage vital signs and the nursing notes.  Pertinent labs & imaging results that were available during my care of the patient were reviewed by me and considered in my medical decision making (see chart for details).     4:03 AM Pt feels good at this time. CT scan demonstrates some fullness of the decompressed bladder which could represent acute cystitis.  His symptoms would be consistent with this as his had a change in his urine color and more suprapubic discomfort.  Urine culture sent.  Rocephin given.  Home with Keflex.  Overall well-appearing.  Vital signs are stable.  No significant complaints.  Discharge home in good condition.  Urology follow-up.  Patient understands to return to the ER for new or worsening  symptoms  Final Clinical Impressions(s) /  ED Diagnoses   Final diagnoses:  Acute cystitis with hematuria  Acute constipation    New Prescriptions New Prescriptions   CEPHALEXIN (KEFLEX) 500 MG CAPSULE    Take 1 capsule (500 mg total) by mouth 3 (three) times daily.   I personally performed the services described in this documentation, which was scribed in my presence. The recorded information has been reviewed and is accurate.        Adam Schmidt, MD 08/01/16 [phone]

## 2016-08-01 ENCOUNTER — Encounter (HOSPITAL_COMMUNITY): Payer: Self-pay

## 2016-08-01 ENCOUNTER — Emergency Department (HOSPITAL_COMMUNITY): Payer: Medicare Other

## 2016-08-01 ENCOUNTER — Other Ambulatory Visit: Payer: Self-pay

## 2016-08-01 LAB — BASIC METABOLIC PANEL
Anion gap: 5 (ref 5–15)
BUN: 15 mg/dL (ref 6–20)
CO2: 27 mmol/L (ref 22–32)
Calcium: 8.5 mg/dL — ABNORMAL LOW (ref 8.9–10.3)
Chloride: 105 mmol/L (ref 101–111)
Creatinine, Ser: 1 mg/dL (ref 0.61–1.24)
GFR calc Af Amer: >60 mL/min (ref >60)
GFR calc non Af Amer: >60 mL/min (ref >60)
Glucose, Bld: 104 mg/dL — ABNORMAL HIGH (ref 65–99)
Potassium: 4.1 mmol/L (ref 3.5–5.1)
Sodium: 137 mmol/L (ref 135–145)

## 2016-08-01 LAB — CBC
HCT: 38.3 % — ABNORMAL LOW (ref 39.0–52.0)
Hemoglobin: 12.9 g/dL — ABNORMAL LOW (ref 13.0–17.0)
MCH: 31.8 pg (ref 26.0–34.0)
MCHC: 33.7 g/dL (ref 30.0–36.0)
MCV: 94.3 fL (ref 78.0–100.0)
Platelets: 209 10*3/uL (ref 150–400)
RBC: 4.06 MIL/uL — ABNORMAL LOW (ref 4.22–5.81)
RDW: 16.3 % — ABNORMAL HIGH (ref 11.5–15.5)
WBC: 6.9 10*3/uL (ref 4.0–10.5)

## 2016-08-01 MED ORDER — DEXTROSE 5 % IV SOLN
1.0000 g | Freq: Once | INTRAVENOUS | Status: AC
  Administered 2016-08-01: 1 g via INTRAVENOUS
  Filled 2016-08-01: qty 10

## 2016-08-01 MED ORDER — IOPAMIDOL (ISOVUE-300) INJECTION 61%
INTRAVENOUS | Status: AC
  Administered 2016-08-01: 100 mL via INTRAVENOUS
  Filled 2016-08-01: qty 100

## 2016-08-01 MED ORDER — MORPHINE SULFATE (PF) 4 MG/ML IV SOLN
4.0000 mg | Freq: Once | INTRAVENOUS | Status: AC
  Administered 2016-08-01: 4 mg via INTRAVENOUS
  Filled 2016-08-01 (×2): qty 1

## 2016-08-01 MED ORDER — ROPINIROLE HCL 0.5 MG PO TABS
0.5000 mg | ORAL_TABLET | Freq: Once | ORAL | Status: AC
  Administered 2016-08-01: 0.5 mg via ORAL
  Filled 2016-08-01: qty 1

## 2016-08-01 MED ORDER — IOPAMIDOL (ISOVUE-300) INJECTION 61%
100.0000 mL | Freq: Once | INTRAVENOUS | Status: AC | PRN
  Administered 2016-08-01: 100 mL via INTRAVENOUS

## 2016-08-01 MED ORDER — CEPHALEXIN 500 MG PO CAPS: 500.0000 mg | ORAL_CAPSULE | Freq: Three times a day (TID) | ORAL | 0 refills | Status: DC

## 2016-08-01 NOTE — ED Notes (Signed)
Provider notified that patient c/o pain and restless leg

## 2016-08-02 ENCOUNTER — Telehealth: Payer: Self-pay | Admitting: Cardiovascular Disease

## 2016-08-02 LAB — CBC WITH DIFFERENTIAL/PLATELET

## 2016-08-02 LAB — URINE CULTURE: Culture: NO GROWTH

## 2016-08-02 NOTE — Telephone Encounter (Signed)
Spoke with daughter and she was concerned with her fathers blood pressure being low. Blood pressure was 84/47 and patient was feeling very weak and tired. She was having [facility] nurse go to see patient. While speaking to daughter nurse called. Patients blood pressure back up to to 108/64 and in no distress. Per daughter the CNA who assists patient had taken blood pressure right after administering medications (Midodrine). She will have assist start taking blood pressure prior to meds and hour or so afterwards. She will also monitor blood pressure periodically to make sure not going high. She will call back if any further issues and will forward to Dr Sallyanne Kuster for review  He has appointment with urologist at 12:30 today for follow up on cystis with hematuria (recent ED visit), will keep visit

## 2016-08-02 NOTE — Telephone Encounter (Signed)
New message    Pt c/o BP issue: STAT if pt c/o blurred vision, one-sided weakness or slurred speech  1. What are your last 5 BP readings? bp bottom number is nearly always near 40 and pt daughter thinks it is too low  2. Are you having any other symptoms (ex. Dizziness, headache, blurred vision, passed out)? no  3. What is your BP issue? Running low

## 2016-08-05 NOTE — Telephone Encounter (Signed)
Yes, the midodrine takes about 30-60 minutes to kick in. It is best to administer it as as soon as he wakes up, before, getting out of bed. MCr

## 2016-08-06 ENCOUNTER — Ambulatory Visit (INDEPENDENT_AMBULATORY_CARE_PROVIDER_SITE_OTHER): Payer: Medicare Other | Admitting: Physician Assistant

## 2016-08-06 ENCOUNTER — Encounter: Payer: Self-pay | Admitting: Physician Assistant

## 2016-08-06 VITALS — Ht 71.0 in | Wt 174.0 lb

## 2016-08-06 DIAGNOSIS — I255 Ischemic cardiomyopathy: Secondary | ICD-10-CM

## 2016-08-06 DIAGNOSIS — Q231 Congenital insufficiency of aortic valve: Secondary | ICD-10-CM

## 2016-08-06 DIAGNOSIS — I951 Orthostatic hypotension: Secondary | ICD-10-CM | POA: Diagnosis not present

## 2016-08-06 DIAGNOSIS — Z950 Presence of cardiac pacemaker: Secondary | ICD-10-CM | POA: Diagnosis not present

## 2016-08-06 DIAGNOSIS — R195 Other fecal abnormalities: Secondary | ICD-10-CM | POA: Diagnosis not present

## 2016-08-06 DIAGNOSIS — I472 Ventricular tachycardia, unspecified: Secondary | ICD-10-CM

## 2016-08-06 DIAGNOSIS — I5042 Chronic combined systolic (congestive) and diastolic (congestive) heart failure: Secondary | ICD-10-CM

## 2016-08-06 DIAGNOSIS — Q230 Congenital stenosis of aortic valve: Secondary | ICD-10-CM

## 2016-08-06 DIAGNOSIS — I25810 Atherosclerosis of coronary artery bypass graft(s) without angina pectoris: Secondary | ICD-10-CM | POA: Diagnosis not present

## 2016-08-06 NOTE — Progress Notes (Signed)
Cardiology Office Note    Date:  08/06/2016   ID:  Adam [name], DOB 1926/05/31, MRN [id number]  PCP:  Blanchie Serve, MD  Cardiologist:  Dr. Sallyanne Kuster   Chief Complaint  Patient presents with  . Follow-up    low BP, SOB/DOE. Seen for Dr. Sallyanne Kuster    History of Present Illness:  Adam [name] is a 83 y.o. male with PMH of CAD s/p CABG in 1996, ICM, combined systolic and diastolic heart failure, orthostatic hypotension that was felt to be neurogenic in etiology, SSS s/p PPM 2010, mild dementia, and VT on amiodarone. He had placement of a stent to SVG to RCA in 2011. Her last cardiac catheterization in 2012 showed patent grafts but all native coronary arteries were occluded. Last Myoview in March 2017 showed EF 52%, medium defect of severe severity present in the basal inferior, basal inferolateral, mid inferior, and mid inferolateral location consistent with prior MI, no ischemia was seen. In June 2016, he was seen for near syncope, dyspnea and extreme fatigue, he did not have loss of consciousness. Interrogation of his pacemaker showed relatively slow ventricular tachycardia around 150 bpm which seems to be regular and the monomorphic. His amiodarone was increased and he has not had any significant ventricular tachycardia since. [facility]. In November 2017, he presented with the right lower extremity weakness and was diagnosed with ischemic stroke. He was prescribed eliquis due to suspicion of stroke related to atrial fibrillation, however later device interrogation showed no signs of atrial fibrillation. He was last seen by Dr. Sallyanne Kuster on 03/04/2016, his device was functioning normally. Estimated generator longevity was 1.2 years. He has 99% atrial pacing and 60% ventricular pacing. There was no atrial fibrillation or ventricular tachycardia on interrogation.  Patient presents today for cardiology office visit for evaluation of hypotension. According to his daughter, his blood  pressure has been persistently low in the past few month running in the 80s. He is currently on Midrin 10 mg followed by 5 mg followed by 5 mg. I will increase to Midrin to 10/10/5 mg for now. He has also not been wearing his compression stocking for the past 2 weeks, he will need to restart on the compression stocking. He has been noticing more fatigue. He has chronic dizziness. He also mentions he has increasing amount of dyspnea on exertion. I'm not entirely sure if this is related to low blood pressure. His wife mentions he has been having some dark stools in the past week. Lab work from a week ago has been reviewed, his hemoglobin was 12.9 at the time. I will recheck a CBC today. He has never had any chest pain in the past despite significant MI, I cannot completely rule out any ischemia. I wish to work him up for secondary causes before considering stress test. Given his advanced age and multiple comorbidities, I think he would be a very poor candidate for invasive workup. He has at least moderate to severe aortic stenosis with possible bicuspid aortic valve. I cannot foresee him as a surgical candidate in any way, if he does have worsening valve issue, he will have gradual decline.   Past Medical History:  Diagnosis Date  . Arthritis    "minor, back and sometimes knees" (12/15/2012)  . Bradycardia    AFib/SSS s/p [employer] PPM 04/12/2008  . CAD (coronary artery disease) 12/30/2014   CABG (LIMA-LAD, SVG-RCA, SVG-OM in 1996).  07/2009 BMS to SVG-RCA. Cath in 04/2010 with patent  stents   . Cardiomyopathy, ischemic 08/25/2012  . CHF (congestive heart failure) ([place])   . Chronic knee pain 12/03/2014  . Combined congestive systolic and diastolic heart failure ([place]) 02/02/2015   Hx EF 41%. BNP 96.8 02/21/15 Torsemide 04/06/15 Na 142, K 4.6, Bun 16, creat 0.89 04/20/14 BNP 111.7, Na 142, K 4.6, Bun 16, creat 0.9   . Depression with anxiety 02/02/2015   02/21/15 Hgb A1c 5.8 03/10/15 MMSE 30/30   . Dizziness, after  diuretic asscoiated with hypotension and responded to fluid bolus 06/05/2011   04/28/15 US carotid R+L normal bilateral arterial velocities.    Marland Kitchen Dyspnea 08/11/2014   Followed in Pulmonary clinic/ [facility]/ [place]  - 08/11/2014  Walked RA x 1 laps @ 185 ft each stopped due to fatigue/off balance/ slow pace/  no sob or desat  - PFT's  09/26/2014  FEV1 2.26 (85 % ) ratio 76  p no % improvement from saba with DLCO  67 % corrects to 93 % for alv volume      Since prev study 08/04/13 minimal change lung vol or dlco    . Embolic cerebral infarction ([place]) 12/06/2015  . Exertional shortness of breath    "sometimes walking" (12/15/2012)  . GERD (gastroesophageal reflux disease)   . Gout 02/09/2015  . Heart murmur    "just told I had one today" (12/15/2012)  . Hiatal hernia   . Hyperlipidemia   . Hypertension   . Hypothyroid   . Influenza A 03/10/2016  . Insomnia   . Melanoma of back ([place]) 1976  . Myocardial infarction [facility]) 1996; 2011   "both silent" (12/15/2012)  . Nonrheumatic aortic valve stenosis   . Orthostatic hypotension   . Osteoporosis, senile   . Pacemaker   . RBBB   . Restless leg 02/02/2015  . Right leg weakness 12/06/2015  . [facility] spotted fever   . S/P CABG x 4   . Sick sinus syndrome ([place]) 01/31/2014  . Sustained ventricular tachycardia ([place]) 07/27/2014  . Urinary retention 12/30/2014    Past Surgical History:  Procedure Laterality Date  . CARDIAC CATHETERIZATION  04/2010   LIMA to LAD patent,SVG to OM patent,no in-stnet restenosis RCA  . CATARACT EXTRACTION W/ INTRAOCULAR LENS  IMPLANT, BILATERAL Bilateral 2012  . CORONARY ANGIOPLASTY WITH STENT PLACEMENT  07/2009   bare metal stent to SVG to the RCA  . CORONARY ARTERY BYPASS GRAFT  1996   LIMA to LAD,SVG to RCA & SVG to OM  . INSERT / REPLACE / REMOVE PACEMAKER  2010  . MELANOMA EXCISION  05/1974 X2   "taken off my back" (12/15/2012)  . NM MYOVIEW LTD  06/2011   low risk  . TONSILLECTOMY  1938  . TRANSURETHRAL  RESECTION OF PROSTATE  1986  . US ECHOCARDIOGRAPHY  07/11/2009   EF 45-50%    Current Medications: Outpatient Medications Prior to Visit  Medication Sig Dispense Refill  . amiodarone (PACERONE) 200 MG tablet Take 400 mg by mouth daily.    Marland Kitchen aspirin EC 81 MG tablet Take 1 tablet (81 mg total) by mouth daily. 30 tablet 1  . cephALEXin (KEFLEX) 500 MG capsule Take 1 capsule (500 mg total) by mouth 3 (three) times daily. 21 capsule 0  . Cholecalciferol (VITAMIN D3) 5000 UNITS CAPS Take 5,000 Units by mouth every Monday.     . clobetasol (TEMOVATE) 0.05 % external solution Apply 1 application topically as needed (scalp).     . clopidogrel (PLAVIX) 75 MG  tablet Take 1 tablet (75 mg total) by mouth daily. 30 tablet 2  . fludrocortisone (FLORINEF) 0.1 MG tablet One daily to help supporrt BP 30 tablet 5  . levothyroxine (SYNTHROID, LEVOTHROID) 137 MCG tablet Take 1 tablet (137 mcg total) by mouth daily before breakfast. 90 tablet 3  . midodrine (PROAMATINE) 5 MG tablet Take 5-10 mg by mouth 3 (three) times daily with meals. 10 Mg in the AM, Take 10 MG at 12 pm and 5mg  at 4 pm    . pantoprazole (PROTONIX) 40 MG tablet Take 40 mg by mouth 2 (two) times daily.    . polyethylene glycol (MIRALAX / GLYCOLAX) packet Take 17 g by mouth 2 (two) times daily.     . pravastatin (PRAVACHOL) 40 MG tablet Take 1 tablet by mouth daily. 40 tablet 1  . rOPINIRole (REQUIP) 0.5 MG tablet Take 0.5 mg by mouth 3 (three) times daily as needed (for restless leg syndrome).    Marland Kitchen senna (SENOKOT) 8.6 MG TABS tablet Take 2-4 tablets by mouth daily as needed for mild constipation.    . tamsulosin (FLOMAX) 0.4 MG CAPS capsule TAKE ONE CAPSULE DAILY TO HELP IMPROVE URINARY FREQUENCY. 30 capsule 0  . traZODone (DESYREL) 50 MG tablet Take 50 mg by mouth at bedtime.    Marland Kitchen acetaminophen (TYLENOL) 325 MG tablet Take 650 mg by mouth as needed. Take 2 tablets for fever greater than 100.3     No facility-administered medications prior to  visit.      Allergies:   Altace [ramipril]; Crestor [rosuvastatin calcium]; Penicillins; and Sulfa antibiotics   Social History   Social History  . Marital status: Married    Spouse name: N/A  . Number of children: 2  . Years of education: Masters   Occupational History  . Retired Company secretary -Pensions consultant    Social History Main Topics  . Smoking status: Never Smoker  . Smokeless tobacco: Never Used  . Alcohol use No  . Drug use: No  . Sexual activity: No   Other Topics Concern  . None   Social History Narrative   Lives at [place] to [place] 01/09/15   Married - Violet   Never smoked   Alcohol none   Previously employed as Scientist, forensic for [employer].         Diet:Low sodium   Do you drink/eat things with caffeine? No   Marital status: Married                              What year were you married?1950   Do you live in a house, apartment, assisted living, condo, trailer, etc)?    Is it one or more stories? 1   How many persons live in your home? 2   Do you have any pets in your home? No   Current or past profession: Minister, [employer]   Do you exercise?     Very Little                                                 Type & how often:    Do you have a living will?  Yes   Do you have a DNR Form? Yes   Do you have a POA/HPOA forms? Yes  Family History:  The patient's family history includes Anuerysm in his son; Coronary artery disease in his father and mother; Diabetes in his father and mother; Heart disease in his brother, father, and mother; Lung cancer in his father.   ROS:   Please see the history of present illness.    ROS All other systems reviewed and are negative.   PHYSICAL EXAM:   VS:  Ht 5\' 11"  (1.803 m)   Wt 174 lb (78.9 kg)   BMI 24.27 kg/m    GEN: Well nourished, well developed, in no acute distress  HEENT: normal  Neck: no JVD, carotid bruits, or masses Cardiac: RRR; no rubs, or gallops,no edema  2/6  systolic murmur Respiratory:  clear to auscultation bilaterally, normal work of breathing GI: soft, nontender, nondistended, + BS MS: no deformity or atrophy  Skin: warm and dry, no rash Neuro:  Alert and Oriented x 3, Strength and sensation are intact Psych: euthymic mood, full affect  Wt Readings from Last 3 Encounters:  08/06/16 174 lb (78.9 kg)  07/31/16 182 lb 6.4 oz (82.7 kg)  06/11/16 176 lb (79.8 kg)      Studies/Labs Reviewed:   EKG:  EKG is ordered today.  The ekg ordered today demonstrates Normal sinus rhythm with right bundle branch block which is chronic  Recent Labs: 05/08/2016: ALT 19; TSH 1.96 08/01/2016: BUN 15; Creatinine, Ser 1.00; Hemoglobin 12.9; Platelets 209; Potassium 4.1; Sodium 137   Lipid Panel    Component Value Date/Time   CHOL 84 12/07/2015 0449   TRIG 102 12/07/2015 0449   HDL 41 12/07/2015 0449   CHOLHDL 2.0 12/07/2015 0449   VLDL 20 12/07/2015 0449   LDLCALC 23 12/07/2015 0449    Additional studies/ records that were reviewed today include:    Cath 04/13/2010 CONCLUSION AND FINDINGS: 1. AO pressure 97/56. 2. Coronary.  The native right coronary was 100% occluded in its origin and in its proximal portion.  The left main bifurcated into LAD and circumflex.  Left main was patent.  The left circumflex was 100% occluded in its origin and in its proximal portion as was the LAD.  Bypass graft to the LIMA, to the LAD was patent.  There was retrograde flowing from the distal LAD to proximal LAD through the LIMA without difficulty.  There is no significant disease.  The SVG to the RCA was patent and mildly distended in its midportion of the body was patent.  It caused retrograde filling of the native RCA branch with proximal portion above the 50% stenosis of the native artery prior to the bifurcation of PLA and PDA.  Saphenous vein graft to OM was patent.  The OM itself was patent all the way through.  There was no significant disease.  FINAL CONCLUSION:   There is patent LIMA to LAD, patent SVG to OM, patent SVG to RCA, native vessel 100% occluded, LAD 100% occluded, circumflex 100% occluded, and 50% distal RCA.  PLAN:  Medical therapy.  There is nothing to intervene upon.  He has low risk coronary anatomy and he will otherwise do well.   Myoview 04/12/2015 Study Highlights    Nuclear stress EF: 32%.  There was no ST segment deviation noted during stress.  Defect 1: There is a medium defect of severe severity present in the basal inferior, basal inferolateral, mid inferior and mid inferolateral location.  Findings consistent with prior myocardial infarction.  This is an intermediate risk study.  The left ventricular ejection fraction is  moderately decreased (30-44%).  Akinesis of the basal and mid inferior and inferolateral walls and paradoxical septal motion.   There is a sacr in the basal and mid inferior and inferolateral leads with associated akinesis. No ischemia.     Echo 01/12/2016 LV EF: 45% -   50%  Study Conclusions  - Left ventricle: The cavity size was normal. There was moderate   focal basal hypertrophy. Systolic function was mildly reduced.   The estimated ejection fraction was in the range of 45% to 50%.   There is akinesis of the basal-midinferolateral and inferior   myocardium. The study is not technically sufficient to allow   evaluation of LV diastolic function. - Aortic valve: Possibly bicuspid. Moderate diffuse thickening and   calcification. Valve mobility was restricted. There was moderate   to severe stenosis. There was trivial regurgitation. Valve area   (VTI): 1.09 cm^2. Valve area (Vmax): 1.01 cm^2. Valve area   (Vmean): 0.97 cm^2. - Mitral valve: Mild focal calcification of the anterior leaflet.   There was mild regurgitation. - Left atrium: The atrium was mildly dilated.  ASSESSMENT:    1. Orthostatic hypotension   2. Dark stools   3. Coronary artery disease involving coronary bypass  graft of native heart without angina pectoris   4. Ischemic cardiomyopathy   5. Chronic combined systolic and diastolic heart failure ([place])   6. Pacemaker   7. V-tach ([place])   8. Aortic stenosis with bicuspid valve      PLAN:  In order of problems listed above:  1. Severe orthostatic hypotension: Increase Midrin to 10/10/5  2. Worsening dyspnea and fatigue: He has multiple reasons for worsening dyspnea and fatigue, related to his advanced age, moderate to severe aortic stenosis, significant orthostatic hypotension. We'll hold off on repeat echocardiogram, last echo in December 2017. He likely will need another echo in December 2018. Although it is possible that his worsening dyspnea and fatigue could be anginal, it is very difficult to tell in this patient will never had any chest pain despite prior history of significant infarct. I wish to hold off on stress test as I do not think he will be a very good surgical candidate.   3. Dark stools: Repeat CBC: Has been having dark stools for the past week.  4. Chronic combined systolic and diastolic heart failure: Recent echocardiogram in December 2017 showed EF 45-50%. Although it is not listed, he actually take torsemide as needed basis. He did take a dose of torsemide yesterday and had significant urinary output. He would not be able to tolerate a scheduled dose of diuretic due to significant orthostatic hypotension and sensitivity to hypovolemia.  5. History of V. tach on amiodarone: He is currently on 400 mg daily of amiodarone, will continue on the current dose   6. SSS s/p PPM: Followed by Dr. Debara Pickett, interrogation in January 2018 showed left expectancy 1.2 years, he will need a repeat interrogation on the next office visit.  7. Moderate to severe aortic stenosis with bicuspid valve: He has known about a heart murmur since age 8, he would not be a very good surgical candidate. He likely will have progressive decline    Medication  Adjustments/Labs and Tests Ordered: Current medicines are reviewed at length with the patient today.  Concerns regarding medicines are outlined above.  Medication changes, Labs and Tests ordered today are listed in the Patient Instructions below. Patient Instructions  Medication Instructions:   Change midodrine to 10mg  in AM, 10mg  at  noon, and 5mg  at 4pm. You may increase the 4pm dose to 10mg  if your systolic blood pressures remain under 90.   Labwork: CBC today   Testing/Procedures:   Follow-Up: Your physician recommends that you schedule a follow-up appointment in: 2 weeks with Almyra Deforest, PA on a day Dr. Sallyanne Kuster is in the office.  Any Other Special Instructions Will Be Listed Below (If Applicable).  Please resume compression stockings.   If you need a refill on your cardiac medications before your next appointment, please call your pharmacy.     Hilbert Corrigan, [place]  08/06/2016 5:06 PM    [facility] [address], [place], [place]  [address] Phone: [phone]; Fax: [phone]

## 2016-08-06 NOTE — Patient Instructions (Addendum)
Medication Instructions:   Change midodrine to 10mg  in AM, 10mg  at noon, and 5mg  at 4pm. You may increase the 4pm dose to 10mg  if your systolic blood pressures remain under 90.   Labwork: CBC today   Testing/Procedures:   Follow-Up: Your physician recommends that you schedule a follow-up appointment in: 2 weeks with Almyra Deforest, PA on a day Dr. Sallyanne Kuster is in the office.  Any Other Special Instructions Will Be Listed Below (If Applicable).  Please resume compression stockings.   If you need a refill on your cardiac medications before your next appointment, please call your pharmacy.

## 2016-08-07 LAB — CBC
Hematocrit: 42.1 % (ref 37.5–51.0)
Hemoglobin: 14.3 g/dL (ref 13.0–17.7)
MCH: 32 pg (ref 26.6–33.0)
MCHC: 34 g/dL (ref 31.5–35.7)
MCV: 94 fL (ref 79–97)
Platelets: 243 10*3/uL (ref 150–379)
RBC: 4.47 x10E6/uL (ref 4.14–5.80)
RDW: 16.1 % — ABNORMAL HIGH (ref 12.3–15.4)
WBC: 7 10*3/uL (ref 3.4–10.8)

## 2016-08-07 NOTE — Progress Notes (Signed)
Normal red blood cell count, no sign of significant blood loss

## 2016-08-14 ENCOUNTER — Non-Acute Institutional Stay: Payer: Medicare Other | Admitting: Internal Medicine

## 2016-08-14 ENCOUNTER — Encounter: Payer: Self-pay | Admitting: Internal Medicine

## 2016-08-14 VITALS — BP 118/62 | HR 70 | Temp 97.4°F | Resp 16 | Ht 70.0 in | Wt 176.0 lb

## 2016-08-14 DIAGNOSIS — R531 Weakness: Secondary | ICD-10-CM

## 2016-08-14 DIAGNOSIS — R0600 Dyspnea, unspecified: Secondary | ICD-10-CM

## 2016-08-14 DIAGNOSIS — R0609 Other forms of dyspnea: Secondary | ICD-10-CM | POA: Diagnosis not present

## 2016-08-14 DIAGNOSIS — R339 Retention of urine, unspecified: Secondary | ICD-10-CM

## 2016-08-14 DIAGNOSIS — E039 Hypothyroidism, unspecified: Secondary | ICD-10-CM

## 2016-08-14 DIAGNOSIS — I2510 Atherosclerotic heart disease of native coronary artery without angina pectoris: Secondary | ICD-10-CM | POA: Diagnosis not present

## 2016-08-14 DIAGNOSIS — I951 Orthostatic hypotension: Secondary | ICD-10-CM

## 2016-08-14 DIAGNOSIS — I5042 Chronic combined systolic (congestive) and diastolic (congestive) heart failure: Secondary | ICD-10-CM

## 2016-08-14 DIAGNOSIS — Z8679 Personal history of other diseases of the circulatory system: Secondary | ICD-10-CM | POA: Diagnosis not present

## 2016-08-14 DIAGNOSIS — E2740 Unspecified adrenocortical insufficiency: Secondary | ICD-10-CM

## 2016-08-14 MED ORDER — MIDODRINE HCL 5 MG PO TABS: 10.0000 mg | ORAL_TABLET | Freq: Three times a day (TID) | ORAL | 3 refills | Status: DC

## 2016-08-14 MED ORDER — ROPINIROLE HCL 0.5 MG PO TABS: 0.5000 mg | ORAL_TABLET | Freq: Three times a day (TID) | ORAL | 1 refills | Status: DC | PRN

## 2016-08-14 MED ORDER — TORSEMIDE 5 MG PO TABS: 10.0000 mg | ORAL_TABLET | Freq: Every day | ORAL | Status: DC | PRN

## 2016-08-14 MED ORDER — FLUDROCORTISONE ACETATE 0.1 MG PO TABS: ORAL_TABLET | ORAL | 5 refills | Status: DC

## 2016-08-14 MED ORDER — LEVOTHYROXINE SODIUM 137 MCG PO TABS: 137.0000 ug | ORAL_TABLET | Freq: Every day | ORAL | 3 refills | Status: DC

## 2016-08-14 NOTE — Progress Notes (Signed)
[facility]   Provider: Blanchie Serve  Goals of Care:  Advanced Directives 07/31/2016  Does Patient Have a Medical Advance Directive? No  Type of Advance Directive -  Does patient want to make changes to medical advance directive? -  Copy of [place] in Chart? -  Would patient like information on creating a medical advance directive? No - Patient declined  Pre-existing out of facility DNR order (yellow form or pink MOST form) -     Chief Complaint  Patient presents with  . Acute Visit    shakiness, weakness, low BP, dizziness    HPI: Patient is a 83 y.o. male seen today for follow up visit. He is here with his wife, daughter and son in law. He is extremely weak compared to last visit 2 weeks back. He needs assistance now with change of position and with transfers. He feels dizzy during position change and has been having progressively increased dyspnea. Daughter has BP reading from home with SBP in 80s and 90s. On review of his vital signs, he is orthostatic this visit. He recently completed his antibiotic for UTI. He was in the ED on 07/31/16 with acute cystitis. He was also seen by cardiology and midodrin dosing was adjusted. He has PMH of CAD s/p CABG, ICM, CHF, CVA, SSS s/p pacemaker, ventricular tachycardia on amiodarone and neurogenic orthostatic hypotension among others.    Past Medical History:  Diagnosis Date  . Arthritis    "minor, back and sometimes knees" (12/15/2012)  . Bradycardia    AFib/SSS s/p [employer] PPM 04/12/2008  . CAD (coronary artery disease) 12/30/2014   CABG (LIMA-LAD, SVG-RCA, SVG-OM in 1996).  07/2009 BMS to SVG-RCA. Cath in 04/2010 with patent stents   . Cardiomyopathy, ischemic 08/25/2012  . CHF (congestive heart failure) ([place])   . Chronic knee pain 12/03/2014  . Combined congestive systolic and diastolic heart failure ([place]) 02/02/2015   Hx EF 41%. BNP 96.8 02/21/15 Torsemide 04/06/15 Na 142, K 4.6, Bun 16, creat 0.89  04/20/14 BNP 111.7, Na 142, K 4.6, Bun 16, creat 0.9   . Depression with anxiety 02/02/2015   02/21/15 Hgb A1c 5.8 03/10/15 MMSE 30/30   . Dizziness, after diuretic asscoiated with hypotension and responded to fluid bolus 06/05/2011   04/28/15 US carotid R+L normal bilateral arterial velocities.    Marland Kitchen Dyspnea 08/11/2014   Followed in Pulmonary clinic/ [facility]/ [place]  - 08/11/2014  Walked RA x 1 laps @ 185 ft each stopped due to fatigue/off balance/ slow pace/  no sob or desat  - PFT's  09/26/2014  FEV1 2.26 (85 % ) ratio 76  p no % improvement from saba with DLCO  67 % corrects to 93 % for alv volume      Since prev study 08/04/13 minimal change lung vol or dlco    . Embolic cerebral infarction ([place]) 12/06/2015  . Exertional shortness of breath    "sometimes walking" (12/15/2012)  . GERD (gastroesophageal reflux disease)   . Gout 02/09/2015  . Heart murmur    "just told I had one today" (12/15/2012)  . Hiatal hernia   . Hyperlipidemia   . Hypertension   . Hypothyroid   . Influenza A 03/10/2016  . Insomnia   . Melanoma of back ([place]) 1976  . Myocardial infarction [facility]) 1996; 2011   "both silent" (12/15/2012)  . Nonrheumatic aortic valve stenosis   . Orthostatic hypotension   . Osteoporosis, senile   .  Pacemaker   . RBBB   . Restless leg 02/02/2015  . Right leg weakness 12/06/2015  . [facility] spotted fever   . S/P CABG x 4   . Sick sinus syndrome ([place]) 01/31/2014  . Sustained ventricular tachycardia ([place]) 07/27/2014  . Urinary retention 12/30/2014    Past Surgical History:  Procedure Laterality Date  . CARDIAC CATHETERIZATION  04/2010   LIMA to LAD patent,SVG to OM patent,no in-stnet restenosis RCA  . CATARACT EXTRACTION W/ INTRAOCULAR LENS  IMPLANT, BILATERAL Bilateral 2012  . CORONARY ANGIOPLASTY WITH STENT PLACEMENT  07/2009   bare metal stent to SVG to the RCA  . CORONARY ARTERY BYPASS GRAFT  1996   LIMA to LAD,SVG to RCA & SVG to OM  . INSERT / REPLACE / REMOVE PACEMAKER   2010  . MELANOMA EXCISION  05/1974 X2   "taken off my back" (12/15/2012)  . NM MYOVIEW LTD  06/2011   low risk  . TONSILLECTOMY  1938  . TRANSURETHRAL RESECTION OF PROSTATE  1986  . US ECHOCARDIOGRAPHY  07/11/2009   EF 45-50%    Allergies  Allergen Reactions  . Altace [Ramipril] Cough  . Crestor [Rosuvastatin Calcium] Rash  . Penicillins Rash and Other (See Comments)    Has patient had a PCN reaction causing immediate rash, facial/tongue/throat swelling, SOB or lightheadedness with hypotension: No Has patient had a PCN reaction causing severe rash involving mucus membranes or skin necrosis: Yes Has patient had a PCN reaction that required hospitalization Yes Has patient had a PCN reaction occurring within the last 10 years: No If all of the above answers are "NO", then may proceed with Cephalosporin use.   . Sulfa Antibiotics Rash    Allergies as of 08/14/2016      Reactions   Altace [ramipril] Cough   Crestor [rosuvastatin Calcium] Rash   Penicillins Rash, Other (See Comments)   Has patient had a PCN reaction causing immediate rash, facial/tongue/throat swelling, SOB or lightheadedness with hypotension: No Has patient had a PCN reaction causing severe rash involving mucus membranes or skin necrosis: Yes Has patient had a PCN reaction that required hospitalization Yes Has patient had a PCN reaction occurring within the last 10 years: No If all of the above answers are "NO", then may proceed with Cephalosporin use.   Sulfa Antibiotics Rash      Medication List       Accurate as of 08/14/16  2:49 PM. Always use your most recent med list.          acetaminophen 325 MG tablet Commonly known as:  TYLENOL Take 650 mg by mouth as needed. Take 2 tablets for fever greater than 100.3   amiodarone 200 MG tablet Commonly known as:  PACERONE Take 400 mg by mouth daily.   aspirin EC 81 MG tablet Take 1 tablet (81 mg total) by mouth daily.   clobetasol 0.05 % external  solution Commonly known as:  TEMOVATE Apply 1 application topically as needed (scalp).   clopidogrel 75 MG tablet Commonly known as:  PLAVIX Take 1 tablet (75 mg total) by mouth daily.   fludrocortisone 0.1 MG tablet Commonly known as:  FLORINEF One daily to help supporrt BP   guaiFENesin 600 MG 12 hr tablet Commonly known as:  MUCINEX Take by mouth 2 (two) times daily.   levothyroxine 137 MCG tablet Commonly known as:  SYNTHROID, LEVOTHROID Take 1 tablet (137 mcg total) by mouth daily before breakfast.   midodrine 5 MG tablet Commonly  known as:  PROAMATINE Take 2 tablets (10 mg total) by mouth 3 (three) times daily with meals.   pantoprazole 40 MG tablet Commonly known as:  PROTONIX Take 40 mg by mouth 2 (two) times daily.   pantoprazole 40 MG tablet Commonly known as:  PROTONIX Take 40 mg by mouth 2 (two) times daily.   polyethylene glycol packet Commonly known as:  MIRALAX / GLYCOLAX Take 17 g by mouth as needed for mild constipation.   rOPINIRole 0.5 MG tablet Commonly known as:  REQUIP Take 1 tablet (0.5 mg total) by mouth 3 (three) times daily as needed (for restless leg syndrome).   senna 8.6 MG tablet Commonly known as:  SENOKOT Take 2-4 tablets by mouth as needed for constipation.   tamsulosin 0.4 MG Caps capsule Commonly known as:  FLOMAX TAKE ONE CAPSULE DAILY TO HELP IMPROVE URINARY FREQUENCY.   traZODone 50 MG tablet Commonly known as:  DESYREL Take 50 mg by mouth at bedtime.   Vitamin D3 5000 units Caps Take 5,000 Units by mouth every Monday.       Review of Systems:  Review of Systems  Constitutional: Positive for appetite change and fatigue. Negative for chills, diaphoresis and fever.  HENT: Positive for hearing loss and postnasal drip. Negative for congestion, mouth sores, rhinorrhea, sinus pain, sinus pressure, sore throat and trouble swallowing.   Respiratory: Positive for cough and shortness of breath. Negative for choking and  wheezing.   Cardiovascular: Positive for leg swelling. Negative for chest pain and palpitations.  Gastrointestinal: Negative for abdominal pain, blood in stool, constipation, diarrhea, nausea and vomiting.  Genitourinary: Negative for flank pain and hematuria.       Has indwelling foley catheter  Musculoskeletal: Negative for back pain.       Uses a walker. Requiring 1-2 person assistance with transfer today  Skin: Negative for rash.  Neurological: Positive for dizziness, tremors, weakness and light-headedness. Negative for seizures, syncope, numbness and headaches.  Psychiatric/Behavioral: Positive for confusion. Negative for behavioral problems.    Health Maintenance  Topic Date Due  . PNA vac Low Risk Adult (2 of 2 - PCV13) 02/04/2017 (Originally 10/06/2010)  . TETANUS/TDAP  08/01/2026 (Originally 01/08/1946)  . INFLUENZA VACCINE  09/04/2016  . DEXA SCAN  12/14/2024    Physical Exam: Vitals:   08/14/16 1133  BP: 118/62  Pulse: 70  Resp: 16  Temp: (!) 97.4 F (36.3 C)  TempSrc: Oral  SpO2: 97%  Weight: 176 lb (79.8 kg)  Height: 5\' 10"  (1.778 m)   Body mass index is 25.25 kg/m.   Wt Readings from Last 3 Encounters:  08/14/16 176 lb (79.8 kg)  08/06/16 174 lb (78.9 kg)  07/31/16 182 lb 6.4 oz (82.7 kg)   Physical Exam  Constitutional: He appears well-developed. No distress.  Frail appearing  HENT:  Head: Normocephalic and atraumatic.  Mouth/Throat: Oropharynx is clear and moist.  Eyes: Conjunctivae and EOM are normal. Pupils are equal, round, and reactive to light.  Neck: Normal range of motion. Neck supple. No thyromegaly present.  Cardiovascular: Normal rate and regular rhythm.   Murmur heard. Pulmonary/Chest: Effort normal. No respiratory distress. He has no wheezes. He has no rales. He exhibits no tenderness.  Poor air movement bilaterally  Abdominal: Soft. Bowel sounds are normal. He exhibits distension. There is no tenderness. There is no rebound and no  guarding.  Foley catheter in place with dark urine in bag  Musculoskeletal: He exhibits edema.  Uses a rolling walker, unsteady  gait and requiring 2 person assistance with transfer  Lymphadenopathy:    He has no cervical adenopathy.  Neurological: He is alert.  Oriented to person and place but not to time  Skin: Skin is warm and dry. No rash noted. He is not diaphoretic.  Stage 2 pressure ulcer to left sacrum  Psychiatric: He has a normal mood and affect.    Labs reviewed: Basic Metabolic Panel:  Recent Labs  11/27/15  03/10/16 1443 03/11/16 0436 04/03/16 05/08/16 08/01/16 0022  NA 141  < > 140 139 138 142 137  K 4.3  < > 3.5 3.7 4.6 4.1 4.1  CL  --   < > 104 108  --   --  105  CO2  --   < > 29 25  --   --  27  GLUCOSE  --   < > 96 105*  --   --  104*  BUN 13  < > 22* 18 15 13 15   CREATININE 0.9  < > 1.03 0.72 0.7 0.8 1.00  CALCIUM  --   < > 8.7* 8.0*  --   --  8.5*  TSH 2.58  --   --   --   --  1.96  --   < > = values in this interval not displayed. Liver Function Tests:  Recent Labs  01/10/16 1400 03/10/16 1443 03/11/16 0436 04/03/16 05/08/16  AST 25 53* 46* 15 20  ALT 22 26 23 14 19   ALKPHOS 68 61 51 60 78  BILITOT 0.4 0.6 0.4  --   --   PROT 6.4* 6.9 5.8*  --   --   ALBUMIN 3.5 3.7 3.1*  --   --    No results for input(s): LIPASE, AMYLASE in the last 8760 hours. No results for input(s): AMMONIA in the last 8760 hours. CBC:  Recent Labs  01/10/16 1400  03/10/16 1443  07/31/16 1359 08/01/16 0022 08/06/16 1641  WBC 6.0  < > 8.3  < > CANCELED 6.9 7.0  NEUTROABS 4.1  --  5.8  --  CANCELED  --   --   HGB 14.1  < > 13.1  < > CANCELED 12.9* 14.3  HCT 42.7  < > 39.5  < > CANCELED 38.3* 42.1  MCV 97.7  < > 95.6  < > CANCELED 94.3 94  PLT 190  < > 190  < > CANCELED 209 243  < > = values in this interval not displayed. Lipid Panel:  Recent Labs  11/27/15 12/07/15 0449  CHOL 97 84  HDL 53 41  LDLCALC 25 23  TRIG 97 102  CHOLHDL  --  2.0   Lab Results   Component Value Date   HGBA1C 5.6 12/07/2015     Assessment/Plan  Generalized weakness Possible deconditioning along with his medical co-morbidities. Will rule out infection, dehydration and arrhythmia. Spoke with pt and his family, will recommend transfer to SNF due to increased need for assistance with care. Will have PT and OT team evaluate pt to work with him.  orthostatic hypotension Pt is orthostatic this visit. Will need to rule out dehydration and infection. Will need SIRS workup. Will increase his midodrine to 10 mg tid for now. Will have him transferred to SNF and will need close monitoring of his vital signs. Consider iv fluids if lab work is suggestive of dehydration/ hypovolemia.   Dyspnea Deconditioning, aortic stenosis, possible CHF exacerbation and hypothyroidism. Check tsh, Echocardiogram. Supportive care for now.  Monitor o2 sat.   Adrenal insufficiency Currently on florinef, check cortisol level to assess for need to adjust the dosing.   Urinary retention Continue foley care and flomax  History of Ventricular tachycardia Last pacemaker interrogated in jan 2018. If weakness persists or worsens, will need to interrogate further. Continue amiodarone for now  CAD Chest pain free. Monitor vital signs given his dyspnea and weakness. Will obtain EKG if he has chest pain. Continue aspirin and plavix.   Combined chf Chronic CHF. Obtain chest xray to rule out pulmonary edema given his symptom and decreased air entry on lung exam. Consider diuretic if BP allows. Monitor daily weight for now. Obtain echocardiogram to assess further.   Hypothyroidism Given his worsening dyspnea and fatigue, check TSH, continue current levothyroxine    Labs/tests ordered:  Cbc with diff, CMP, TSH, u/a with c/s, CXR, echo  Reviewed care plan with patient, his wife and daughter/HCPOA, charge nurse and have filled out FL2 form. Have advised for pt to be transferred to SNF for increased level  of care. Pending clearance from management for transfer.   I have spent greater than 60 minutes in reviewing chart, examining the patient, addressing above mentioned concerns, filling out paperwork, answering family questions and to arrange for transition of care from independent living to SNF.     Blanchie Serve, MD Internal Medicine [facility] [address] [place], [place] [address] Cell Phone (Monday-Friday 8 am - 5 pm): [phone] On Call: [phone] and follow prompts after 5 pm and on weekends Office Phone: [phone] Office Fax: [phone]

## 2016-08-15 ENCOUNTER — Other Ambulatory Visit: Payer: Self-pay | Admitting: Internal Medicine

## 2016-08-15 ENCOUNTER — Encounter: Payer: Self-pay | Admitting: *Deleted

## 2016-08-15 LAB — CBC AND DIFFERENTIAL
HCT: 37 — AB (ref 41–53)
HCT: 37 — AB (ref 41–53)
Hemoglobin: 12.5 — AB (ref 13.5–17.5)
Hemoglobin: 12.5 — AB (ref 13.5–17.5)
Platelets: 202 (ref 150–399)
WBC: 5.4
WBC: 5.4

## 2016-08-15 LAB — HEPATIC FUNCTION PANEL
ALT: 21 (ref 10–40)
AST: 22 (ref 14–40)
Alkaline Phosphatase: 62 (ref 25–125)
Bilirubin, Total: 0.3

## 2016-08-15 LAB — BASIC METABOLIC PANEL
BUN: 14 (ref 4–21)
Creatinine: 0.9 (ref 0.6–1.3)
Glucose: 105
Potassium: 3.7 (ref 3.4–5.3)
Sodium: 141 (ref 137–147)

## 2016-08-15 LAB — CORTISOL: Cortisol: 9

## 2016-08-16 ENCOUNTER — Encounter: Payer: Self-pay | Admitting: Internal Medicine

## 2016-08-16 ENCOUNTER — Non-Acute Institutional Stay (SKILLED_NURSING_FACILITY): Payer: Medicare Other | Admitting: Internal Medicine

## 2016-08-16 DIAGNOSIS — F552 Abuse of laxatives: Secondary | ICD-10-CM | POA: Diagnosis not present

## 2016-08-16 DIAGNOSIS — R710 Precipitous drop in hematocrit: Secondary | ICD-10-CM

## 2016-08-16 DIAGNOSIS — G2581 Restless legs syndrome: Secondary | ICD-10-CM

## 2016-08-16 DIAGNOSIS — I495 Sick sinus syndrome: Secondary | ICD-10-CM | POA: Diagnosis not present

## 2016-08-16 DIAGNOSIS — R531 Weakness: Secondary | ICD-10-CM

## 2016-08-16 DIAGNOSIS — I5043 Acute on chronic combined systolic (congestive) and diastolic (congestive) heart failure: Secondary | ICD-10-CM | POA: Diagnosis not present

## 2016-08-16 DIAGNOSIS — R339 Retention of urine, unspecified: Secondary | ICD-10-CM

## 2016-08-16 DIAGNOSIS — R0600 Dyspnea, unspecified: Secondary | ICD-10-CM

## 2016-08-16 DIAGNOSIS — Z8679 Personal history of other diseases of the circulatory system: Secondary | ICD-10-CM | POA: Diagnosis not present

## 2016-08-16 DIAGNOSIS — R0609 Other forms of dyspnea: Secondary | ICD-10-CM | POA: Diagnosis not present

## 2016-08-16 DIAGNOSIS — I951 Orthostatic hypotension: Secondary | ICD-10-CM

## 2016-08-16 NOTE — Progress Notes (Signed)
Provider:  Blanchie Serve MD  Location:  [facility] Room Number: 6 Place of Service:  SNF (31)  PCP: Blanchie Serve, MD Patient Care Team: Blanchie Serve, MD as PCP - General (Internal Medicine) Croitoru, Dani Gobble, MD as Attending Physician (Cardiology) Vevelyn Royals, MD as Consulting Physician (Ophthalmology) Franchot Gallo, MD as Consulting Physician (Urology) Tanda Rockers, MD as Consulting Physician (Pulmonary Disease) Allyn Kenner, MD (Dermatology) Deliah Goody, PA-C as Physician Assistant (Physician Assistant) Ngetich, Nelda Bucks, NP as Nurse Practitioner (Family Medicine)  Extended Emergency Contact Information Primary Emergency Contact: Fabio,[name] Address: [address]          [place] Phone: [phone] Mobile Phone: [phone] Relation: Spouse Secondary Emergency Contact: [name],Tabora  [place] Phone: [phone] Mobile Phone: [phone] Relation: Daughter  Code Status: Full Code  Goals of Care: Advanced Directive information Advanced Directives 08/16/2016  Does Patient Have a Medical Advance Directive? Yes  Type of Advance Directive [place]  Does patient want to make changes to medical advance directive? No - Patient declined  Copy of [place] in Chart? -  Would patient like information on creating a medical advance directive? -  Pre-existing out of facility DNR order (yellow form or pink MOST form) -      Chief Complaint  Patient presents with  . New Admit To SNF    New Admission Visit    HPI: Patient is a 83 y.o. male seen today for admission to SNF due to deconditioning and increased assistance with ADLs. He was residing in [place] prior to this. He is seen in his room with his wife present. He continues to feel weak and is short of breath with minimal exertion. He feels dizzy during position change. He has PMH of CAD s/p  CABG, ICM, CHF, CVA, SSS s/p pacemaker, ventricular tachycardia on amiodarone and neurogenic orthostatic hypotension among others.    Past Medical History:  Diagnosis Date  . Arthritis    "minor, back and sometimes knees" (12/15/2012)  . Bradycardia    AFib/SSS s/p St Jude PPM 04/12/2008  . CAD (coronary artery disease) 12/30/2014   CABG (LIMA-LAD, SVG-RCA, SVG-OM in 1996).  07/2009 BMS to SVG-RCA. Cath in 04/2010 with patent stents   . Cardiomyopathy, ischemic 08/25/2012  . CHF (congestive heart failure) ([place])   . Chronic knee pain 12/03/2014  . Combined congestive systolic and diastolic heart failure ([place]) 02/02/2015   Hx EF 41%. BNP 96.8 02/21/15 Torsemide 04/06/15 Na 142, K 4.6, Bun 16, creat 0.89 04/20/14 BNP 111.7, Na 142, K 4.6, Bun 16, creat 0.9   . Depression with anxiety 02/02/2015   02/21/15 Hgb A1c 5.8 03/10/15 MMSE 30/30   . Dizziness, after diuretic asscoiated with hypotension and responded to fluid bolus 06/05/2011   04/28/15 US carotid R+L normal bilateral arterial velocities.    Marland Kitchen Dyspnea 08/11/2014   Followed in Pulmonary clinic/ [facility]/ [place]  - 08/11/2014  Walked RA x 1 laps @ 185 ft each stopped due to fatigue/off balance/ slow pace/  no sob or desat  - PFT's  09/26/2014  FEV1 2.26 (85 % ) ratio 76  p no % improvement from saba with DLCO  67 % corrects to 93 % for alv volume      Since prev study 08/04/13 minimal change lung vol or dlco    . Embolic cerebral infarction ([place]) 12/06/2015  . Exertional shortness of breath    "  sometimes walking" (12/15/2012)  . GERD (gastroesophageal reflux disease)   . Gout 02/09/2015  . Heart murmur    "just told I had one today" (12/15/2012)  . Hiatal hernia   . Hyperlipidemia   . Hypertension   . Hypothyroid   . Influenza A 03/10/2016  . Insomnia   . Melanoma of back ([place]) 1976  . Myocardial infarction [facility]) 1996; 2011   "both silent" (12/15/2012)  . Nonrheumatic aortic valve stenosis   . Orthostatic hypotension   . Osteoporosis,  senile   . Pacemaker   . RBBB   . Restless leg 02/02/2015  . Right leg weakness 12/06/2015  . [facility] spotted fever   . S/P CABG x 4   . Sick sinus syndrome ([place]) 01/31/2014  . Sustained ventricular tachycardia ([place]) 07/27/2014  . Urinary retention 12/30/2014   Past Surgical History:  Procedure Laterality Date  . CARDIAC CATHETERIZATION  04/2010   LIMA to LAD patent,SVG to OM patent,no in-stnet restenosis RCA  . CATARACT EXTRACTION W/ INTRAOCULAR LENS  IMPLANT, BILATERAL Bilateral 2012  . CORONARY ANGIOPLASTY WITH STENT PLACEMENT  07/2009   bare metal stent to SVG to the RCA  . CORONARY ARTERY BYPASS GRAFT  1996   LIMA to LAD,SVG to RCA & SVG to OM  . INSERT / REPLACE / REMOVE PACEMAKER  2010  . MELANOMA EXCISION  05/1974 X2   "taken off my back" (12/15/2012)  . NM MYOVIEW LTD  06/2011   low risk  . TONSILLECTOMY  1938  . TRANSURETHRAL RESECTION OF PROSTATE  1986  . US ECHOCARDIOGRAPHY  07/11/2009   EF 45-50%    reports that he has never smoked. He has never used smokeless tobacco. He reports that he does not drink alcohol or use drugs. Social History   Social History  . Marital status: Married    Spouse name: N/A  . Number of children: 2  . Years of education: Masters   Occupational History  . Retired Company secretary -Pensions consultant    Social History Main Topics  . Smoking status: Never Smoker  . Smokeless tobacco: Never Used  . Alcohol use No  . Drug use: No  . Sexual activity: No   Other Topics Concern  . Not on file   Social History Narrative   Lives at [place] to [place] 01/09/15   Married - Violet   Never smoked   Alcohol none   Previously employed as Scientist, forensic for [employer].         Diet:Low sodium   Do you drink/eat things with caffeine? No   Marital status: Married                              What year were you married?1950   Do you live in a house, apartment, assisted living, condo, trailer, etc)?    Is it one or more  stories? 1   How many persons live in your home? 2   Do you have any pets in your home? No   Current or past profession: Minister, [employer]   Do you exercise?     Very Little                                                 Type & how  often:    Do you have a living will?  Yes   Do you have a DNR Form? Yes   Do you have a POA/HPOA forms? Yes    Functional Status Survey:    Family History  Problem Relation Age of Onset  . Coronary artery disease Mother   . Diabetes Mother   . Heart disease Mother   . Coronary artery disease Father   . Diabetes Father   . Lung cancer Father   . Heart disease Father   . Anuerysm Son   . Heart disease Brother     Health Maintenance  Topic Date Due  . PNA vac Low Risk Adult (2 of 2 - PCV13) 02/04/2017 (Originally 10/06/2010)  . TETANUS/TDAP  08/01/2026 (Originally 01/08/1946)  . INFLUENZA VACCINE  09/04/2016  . DEXA SCAN  12/14/2024    Allergies  Allergen Reactions  . Altace [Ramipril] Cough  . Crestor [Rosuvastatin Calcium] Rash  . Penicillins Rash and Other (See Comments)    Has patient had a PCN reaction causing immediate rash, facial/tongue/throat swelling, SOB or lightheadedness with hypotension: No Has patient had a PCN reaction causing severe rash involving mucus membranes or skin necrosis: Yes Has patient had a PCN reaction that required hospitalization Yes Has patient had a PCN reaction occurring within the last 10 years: No If all of the above answers are "NO", then may proceed with Cephalosporin use.   . Sulfa Antibiotics Rash    Outpatient Encounter Prescriptions as of 08/16/2016  Medication Sig  . acetaminophen (TYLENOL) 325 MG tablet Take 650 mg by mouth every 8 (eight) hours as needed.   Marland Kitchen amiodarone (PACERONE) 200 MG tablet Take 400 mg by mouth daily.  Marland Kitchen aspirin EC 81 MG tablet Take 1 tablet (81 mg total) by mouth daily.  . Cholecalciferol (VITAMIN D3) 5000 UNITS CAPS Take 5,000 Units by mouth every Monday.   .  clopidogrel (PLAVIX) 75 MG tablet Take 1 tablet (75 mg total) by mouth daily.  . fludrocortisone (FLORINEF) 0.1 MG tablet One daily to help supporrt BP  . guaiFENesin (MUCINEX) 600 MG 12 hr tablet Take 600 mg by mouth 2 (two) times daily.   Marland Kitchen levothyroxine (SYNTHROID, LEVOTHROID) 137 MCG tablet Take 1 tablet (137 mcg total) by mouth daily before breakfast.  . midodrine (PROAMATINE) 5 MG tablet Take 2 tablets (10 mg total) by mouth 3 (three) times daily with meals.  . pantoprazole (PROTONIX) 40 MG tablet Take 40 mg by mouth 2 (two) times daily.  . polyethylene glycol (MIRALAX / GLYCOLAX) packet Take 17 g by mouth 2 (two) times daily.   . pravastatin (PRAVACHOL) 40 MG tablet Take 40 mg by mouth at bedtime.  Marland Kitchen rOPINIRole (REQUIP) 0.5 MG tablet Take 1 tablet (0.5 mg total) by mouth 3 (three) times daily as needed (for restless leg syndrome).  Marland Kitchen senna (SENOKOT) 8.6 MG tablet Take 2 tablets by mouth daily as needed for constipation.   . tamsulosin (FLOMAX) 0.4 MG CAPS capsule TAKE ONE CAPSULE DAILY TO HELP IMPROVE URINARY FREQUENCY.  Marland Kitchen torsemide (DEMADEX) 10 MG tablet Take 10 mg by mouth daily as needed.  . traZODone (DESYREL) 50 MG tablet Take 50 mg by mouth at bedtime.  . [DISCONTINUED] clobetasol (TEMOVATE) 0.05 % external solution Apply 1 application topically as needed (scalp).   . [DISCONTINUED] pantoprazole (PROTONIX) 40 MG tablet Take 40 mg by mouth 2 (two) times daily.  . [DISCONTINUED] torsemide (DEMADEX) tablet 10 mg    No facility-administered encounter medications on  file as of 08/16/2016.     Review of Systems  Constitutional: Positive for fatigue. Negative for appetite change, chills, diaphoresis and fever.  HENT: Positive for hearing loss. Negative for congestion, mouth sores, nosebleeds, rhinorrhea, trouble swallowing and voice change.   Respiratory: Positive for cough and shortness of breath. Negative for wheezing.        Mostly dry cough, every morning he coughs up yellow phlegm.  Ongoing shortness of breath  Cardiovascular: Positive for leg swelling. Negative for chest pain and palpitations.  Gastrointestinal: Positive for diarrhea. Negative for abdominal pain, nausea and vomiting.       Last bowel movement was yesterday. Watery stool. No blood x 3.   Genitourinary: Negative for dysuria and flank pain.       Has indwelling foley catheter for urinary retention  Musculoskeletal: Positive for gait problem. Negative for back pain.  Skin: Negative for rash.  Neurological: Positive for dizziness and weakness. Negative for seizures, syncope, numbness and headaches.       With the change of position   Psychiatric/Behavioral: Negative for behavioral problems.    Vitals:   08/16/16 1036  BP: (!) 132/58  Pulse: 82  Resp: 20  Temp: 98 F (36.7 C)  TempSrc: Oral  SpO2: 99%  Weight: 180 lb 3.2 oz (81.7 kg)  Height: 5\' 10"  (1.778 m)   Body mass index is 25.86 kg/m. Physical Exam  Constitutional: He is oriented to person, place, and time. He appears well-developed. No distress.  HENT:  Head: Normocephalic and atraumatic.  Mouth/Throat: Oropharynx is clear and moist.  Missing his lower teeth  Eyes: Pupils are equal, round, and reactive to light. Conjunctivae are normal.  Neck: Normal range of motion. Neck supple. No JVD present. No thyromegaly present.  Cardiovascular: Normal rate and regular rhythm.   Murmur heard. Pulmonary/Chest: Effort normal and breath sounds normal.  Abdominal: Soft. Bowel sounds are normal. He exhibits no distension. There is no tenderness.  Genitourinary:  Genitourinary Comments: Foley catheter in place with clear urine  Musculoskeletal: He exhibits edema.  Can move all 4 extremities, weakness to his legs, arthritis changes present, wide based gait and uses walker. 1+ pitting pedal edema  Lymphadenopathy:    He has no cervical adenopathy.  Neurological: He is alert and oriented to person, place, and time.  Skin: Skin is warm and dry. No  rash noted. He is not diaphoretic. No erythema.  Easy bruising  Psychiatric: He has a normal mood and affect.    Labs reviewed: Basic Metabolic Panel:  Recent Labs  03/10/16 1443 03/11/16 0436  05/08/16 08/01/16 0022 08/15/16  NA 140 139  < > 142 137 141  K 3.5 3.7  < > 4.1 4.1 3.7  CL 104 108  --   --  105  --   CO2 29 25  --   --  27  --   GLUCOSE 96 105*  --   --  104*  --   BUN 22* 18  < > 13 15 14   CREATININE 1.03 0.72  < > 0.8 1.00 0.9  CALCIUM 8.7* 8.0*  --   --  8.5*  --   < > = values in this interval not displayed. Liver Function Tests:  Recent Labs  01/10/16 1400 03/10/16 1443 03/11/16 0436 04/03/16 05/08/16 08/15/16  AST 25 53* 46* 15 20 22   ALT 22 26 23 14 19 21   ALKPHOS 68 61 51 60 78 62  BILITOT 0.4 0.6 0.4  --   --   --  PROT 6.4* 6.9 5.8*  --   --   --   ALBUMIN 3.5 3.7 3.1*  --   --   --    No results for input(s): LIPASE, AMYLASE in the last 8760 hours. No results for input(s): AMMONIA in the last 8760 hours. CBC:  Recent Labs  01/10/16 1400  03/10/16 1443  07/31/16 1359 08/01/16 0022 08/06/16 1641 08/15/16  WBC 6.0  < > 8.3  < > CANCELED 6.9 7.0 5.4  5.4  NEUTROABS 4.1  --  5.8  --  CANCELED  --   --   --   HGB 14.1  < > 13.1  < > CANCELED 12.9* 14.3 12.5*  12.5*  HCT 42.7  < > 39.5  < > CANCELED 38.3* 42.1 37*  37*  MCV 97.7  < > 95.6  < > CANCELED 94.3 94  --   PLT 190  < > 190  < > CANCELED 209 243 202  < > = values in this interval not displayed. Cardiac Enzymes:  Recent Labs  01/10/16 1904 01/10/16 2342 01/11/16 0537  TROPONINI <0.03 <0.03 <0.03   BNP: Invalid input(s): POCBNP Lab Results  Component Value Date   HGBA1C 5.6 12/07/2015   Lab Results  Component Value Date   TSH 1.96 05/08/2016   No results found for: VITAMINB12 No results found for: FOLATE No results found for: IRON, TIBC, FERRITIN  Imaging and Procedures obtained prior to SNF admission: Ct Abdomen Pelvis W Contrast  Result Date:  08/01/2016 CLINICAL DATA:  83 y/o M; increasing lower abdominal pressure, chronic foley, abd distention. no nausea or vomiting. EXAM: CT ABDOMEN AND PELVIS WITH CONTRAST TECHNIQUE: Multidetector CT imaging of the abdomen and pelvis was performed using the standard protocol following bolus administration of intravenous contrast. CONTRAST:  100 cc Isovue-300 COMPARISON:  12/29/2014 CT of the abdomen and pelvis. FINDINGS: Lower chest: Large hiatal hernia containing the entirety of the stomach. Partially visualized pacemaker leads in right atrium and ventricle. Mitral valvular calcification. Hepatobiliary: No focal liver abnormality is seen. No gallstones, gallbladder wall thickening, or biliary dilatation. Pancreas: Unremarkable. No pancreatic ductal dilatation or surrounding inflammatory changes. Spleen: Normal in size without focal abnormality. Adrenals/Urinary Tract: Stable bilateral renal cysts the largest in the left lower pole measuring up to 4.9 cm. No urinary stone disease or hydronephrosis. The bladder is collapsed around the Foley catheter. Diffuse bladder wall thickening. Stomach/Bowel: No obstructive or inflammatory changes of the bowel. Large volume of stool throughout the colon. Vascular/Lymphatic: 14 mm penetrating ulcer directed posteriorly and inferiorly from the left lateral wall of infrarenal abdominal aorta (series 4, image 8). Severe calcific atherosclerosis of the abdominal aorta. Reproductive: Prostate is unremarkable. Other: No abdominal wall hernia or abnormality. No abdominopelvic ascites. Musculoskeletal: Advanced degenerative changes of the visible thoracic and lumbar spine. No acute osseous abnormality is identified. Mild bilateral hip osteoarthrosis. IMPRESSION: 1. Bladder collapsed around the Foley catheter. Bladder wall thickening more than expected for underdistention, this may represent cystitis or be due to prior hypertrophy. 2. Large volume of stool in the colon, probably  constipation. 3. Large hiatal hernia containing the entirety of stomach. 4. 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 Electronically Signed   By: Kristine Garbe M.D.   On: 08/01/2016 03:12   Chest X-Ray 2 Views 08/14/16 Impression. Cardiomegaly, chronic and postoperative changes as discussed. No evidence of acute pulmonary pathology  Assessment/Plan  Generalized weakness likely deconditioning along with his medical co-morbidities- AS, CHF, SSS s/p  pacemaker, orthostatic hypotension and age. Infectious workup has been negative. Lab work not suggestive of hypovolemia. Continues to have hypotension- slightly improved from before. Will have him work with physical therapy and occupational therapy team to help with gait training and muscle strengthening exercises.fall precautions. Skin care. Encourage to be out of bed.   orthostatic hypotension Improved BP reading on review. Likely neurogenic. 1 low reading of 90s/50 but otherwise all SBP are >100. Pt still has dizziness with change of position. Continue midodrine 10 mg tid for now. Currently on florinef, cortisol level stable. Lab work rules out hypovolemia and infection. Continue BP monitor. Slow position change encouraged. To wear ted hose/ support hose to help with orthostasis. Will obtain echocardiogram to assess his systolic function. Continue to monitor orthostatic vitals.   Dyspnea Present but improved from 2 days back some. Deconditioning likely is the main factor with mild acute chf exacerbation. He has CHF with AS. all of these can contribute to progressive dyspnea as well. Obtain echocardiogram and make f/u with cardiology for possible interrogation of his pacemaker to rule out arrhythmia. Provide a dose of torsemide given his weight gain.  Drop in Hemoglobin On lab review, drop in Hb from 14 to 12. No signs of bleed. Monitor cbc given his weakness and drop in Hb with orthostasis. Check FOBT given drop in Hb with him  on aspirin and plavix. Check ferritin level.  History of Ventricular tachycardia Continue his amiodarone  SSS S/p pacemaker. Last pacemaker interrogated in jan 2018. Make cardiology appointment to interrogate his pacemaker.  Combined chf Wt Readings from Last 3 Encounters:  08/16/16 180 lb 3.2 oz (81.7 kg)  08/14/16 176 lb (79.8 kg)  08/06/16 174 lb (78.9 kg)   Chronic CHF. Concern for mild CHF exacerbation present though chest xray does not show pulmonary edema or vascular congestion. He has gained 4 lbs. To provide torsemide 10 mg po daily x 1 today and check weight daily. only if has > 3 lb weight gain and BP allows. Echocardiogram ordered   Diarrhea With his bowel regimen, hold miralax for now and place him on prn only.   Hypothyroidism Lab Results  Component Value Date   TSH 1.96 05/08/2016  check TSH, continue current levothyroxine   Urinary retention Continue foley care and flomax  RLS Continue requip   Family/ staff Communication: reviewed care plan with patient, his wife and charge nurse. If BP drops further or his weakness worsens with him being provided torsemide due to fluid retention, will need hospital admisison for close monitoring during diuresis.    Labs/tests ordered: TSH, echocardiogram, cardiology referral  Blanchie Serve, MD Internal Medicine [place], [place] [address] Cell Phone (Monday-Friday 8 am - 5 pm): [phone] On Call: [phone] and follow prompts after 5 pm and on weekends Office Phone: [phone] Office Fax: [phone]

## 2016-08-19 ENCOUNTER — Non-Acute Institutional Stay (SKILLED_NURSING_FACILITY): Payer: Medicare Other | Admitting: Family

## 2016-08-19 ENCOUNTER — Telehealth: Payer: Self-pay | Admitting: Physician Assistant

## 2016-08-19 ENCOUNTER — Encounter: Payer: Self-pay | Admitting: Family

## 2016-08-19 DIAGNOSIS — R600 Localized edema: Secondary | ICD-10-CM

## 2016-08-19 DIAGNOSIS — N3001 Acute cystitis with hematuria: Secondary | ICD-10-CM

## 2016-08-19 NOTE — Progress Notes (Signed)
Location:  [facility] Room Number: N-6 Place of Service:  SNF (31) Provider: Al Gagen FNP-C  Blanchie Serve, MD  Patient Care Team: Blanchie Serve, MD as PCP - General (Internal Medicine) Croitoru, Dani Gobble, MD as Attending Physician (Cardiology) Vevelyn Royals, MD as Consulting Physician (Ophthalmology) Franchot Gallo, MD as Consulting Physician (Urology) Tanda Rockers, MD as Consulting Physician (Pulmonary Disease) Allyn Kenner, MD (Dermatology) Deliah Goody, PA-C as Physician Assistant (Physician Assistant) Vinnie Gombert, Nelda Bucks, NP as Nurse Practitioner (Family Medicine)  Extended Emergency Contact Information Primary Emergency Contact: Speights,[name] Address: [address]          [place] Phone: [phone] Mobile Phone: [phone] Relation: Spouse Secondary Emergency Contact: [name],Umbaugh  [place] Phone: [phone] Mobile Phone: [phone] Relation: Daughter  Code Status:  DNR  Goals of care: Advanced Directive information Advanced Directives 08/16/2016  Does Patient Have a Medical Advance Directive? Yes  Type of Advance Directive [place]  Does patient want to make changes to medical advance directive? No - Patient declined  Copy of [place] in Chart? -  Would patient like information on creating a medical advance directive? -  Pre-existing out of facility DNR order (yellow form or pink MOST form) -     Chief Complaint  Patient presents with  . Acute Visit    Abnormal labs    HPI:  Pt is a 83 y.o. male seen today at [facility] for an acute visit for evaluation of abnormal lab results. He is seen in his room today with wife at bedside. He complains of lower extremities edema.MD recently ordered Ted hose on in the morning and off at bedtime. He has a Furosemide as needed for weight gain > 3 pounds in 2 days. His recent urine  analysis showed occult blood 2+,trace protein, Nitrite positive, leukocyte estrace 20-40 and few bacteria. Urine culture positive for 50,000-100,000 colonies of coagulase negative staphylococcus, not S. Saprophyticus.    Past Medical History:  Diagnosis Date  . Arthritis    "minor, back and sometimes knees" (12/15/2012)  . Bradycardia    AFib/SSS s/p St Jude PPM 04/12/2008  . CAD (coronary artery disease) 12/30/2014   CABG (LIMA-LAD, SVG-RCA, SVG-OM in 1996).  07/2009 BMS to SVG-RCA. Cath in 04/2010 with patent stents   . Cardiomyopathy, ischemic 08/25/2012  . CHF (congestive heart failure) ([place])   . Chronic knee pain 12/03/2014  . Combined congestive systolic and diastolic heart failure ([place]) 02/02/2015   Hx EF 41%. BNP 96.8 02/21/15 Torsemide 04/06/15 Na 142, K 4.6, Bun 16, creat 0.89 04/20/14 BNP 111.7, Na 142, K 4.6, Bun 16, creat 0.9   . Depression with anxiety 02/02/2015   02/21/15 Hgb A1c 5.8 03/10/15 MMSE 30/30   . Dizziness, after diuretic asscoiated with hypotension and responded to fluid bolus 06/05/2011   04/28/15 US carotid R+L normal bilateral arterial velocities.    Marland Kitchen Dyspnea 08/11/2014   Followed in Pulmonary clinic/ [facility]/ [place]  - 08/11/2014  Walked RA x 1 laps @ 185 ft each stopped due to fatigue/off balance/ slow pace/  no sob or desat  - PFT's  09/26/2014  FEV1 2.26 (85 % ) ratio 76  p no % improvement from saba with DLCO  67 % corrects to 93 % for alv volume      Since prev study 08/04/13 minimal change lung vol or dlco    . Embolic cerebral  infarction ([place]) 12/06/2015  . Exertional shortness of breath    "sometimes walking" (12/15/2012)  . GERD (gastroesophageal reflux disease)   . Gout 02/09/2015  . Heart murmur    "just told I had one today" (12/15/2012)  . Hiatal hernia   . Hyperlipidemia   . Hypertension   . Hypothyroid   . Influenza A 03/10/2016  . Insomnia   . Melanoma of back ([place]) 1976  . Myocardial infarction [facility]) 1996; 2011   "both silent" (12/15/2012)  .  Nonrheumatic aortic valve stenosis   . Orthostatic hypotension   . Osteoporosis, senile   . Pacemaker   . RBBB   . Restless leg 02/02/2015  . Right leg weakness 12/06/2015  . [facility] spotted fever   . S/P CABG x 4   . Sick sinus syndrome ([place]) 01/31/2014  . Sustained ventricular tachycardia ([place]) 07/27/2014  . Urinary retention 12/30/2014   Past Surgical History:  Procedure Laterality Date  . CARDIAC CATHETERIZATION  04/2010   LIMA to LAD patent,SVG to OM patent,no in-stnet restenosis RCA  . CATARACT EXTRACTION W/ INTRAOCULAR LENS  IMPLANT, BILATERAL Bilateral 2012  . CORONARY ANGIOPLASTY WITH STENT PLACEMENT  07/2009   bare metal stent to SVG to the RCA  . CORONARY ARTERY BYPASS GRAFT  1996   LIMA to LAD,SVG to RCA & SVG to OM  . INSERT / REPLACE / REMOVE PACEMAKER  2010  . MELANOMA EXCISION  05/1974 X2   "taken off my back" (12/15/2012)  . NM MYOVIEW LTD  06/2011   low risk  . TONSILLECTOMY  1938  . TRANSURETHRAL RESECTION OF PROSTATE  1986  . US ECHOCARDIOGRAPHY  07/11/2009   EF 45-50%    Allergies  Allergen Reactions  . Altace [Ramipril] Cough  . Crestor [Rosuvastatin Calcium] Rash  . Penicillins Rash and Other (See Comments)    Has patient had a PCN reaction causing immediate rash, facial/tongue/throat swelling, SOB or lightheadedness with hypotension: No Has patient had a PCN reaction causing severe rash involving mucus membranes or skin necrosis: Yes Has patient had a PCN reaction that required hospitalization Yes Has patient had a PCN reaction occurring within the last 10 years: No If all of the above answers are "NO", then may proceed with Cephalosporin use.   . Sulfa Antibiotics Rash    Allergies as of 08/19/2016      Reactions   Altace [ramipril] Cough   Crestor [rosuvastatin Calcium] Rash   Penicillins Rash, Other (See Comments)   Has patient had a PCN reaction causing immediate rash, facial/tongue/throat swelling, SOB or lightheadedness with hypotension:  No Has patient had a PCN reaction causing severe rash involving mucus membranes or skin necrosis: Yes Has patient had a PCN reaction that required hospitalization Yes Has patient had a PCN reaction occurring within the last 10 years: No If all of the above answers are "NO", then may proceed with Cephalosporin use.   Sulfa Antibiotics Rash      Medication List       Accurate as of 08/19/16  2:46 PM. Always use your most recent med list.          acetaminophen 325 MG tablet Commonly known as:  TYLENOL Take 650 mg by mouth every 8 (eight) hours as needed.   amiodarone 200 MG tablet Commonly known as:  PACERONE Take 400 mg by mouth daily.   aspirin EC 81 MG tablet Take 1 tablet (81 mg total) by mouth daily.   clopidogrel 75 MG tablet Commonly  known as:  PLAVIX Take 1 tablet (75 mg total) by mouth daily.   fludrocortisone 0.1 MG tablet Commonly known as:  FLORINEF One daily to help supporrt BP   guaiFENesin 600 MG 12 hr tablet Commonly known as:  MUCINEX Take 600 mg by mouth 2 (two) times daily.   levothyroxine 137 MCG tablet Commonly known as:  SYNTHROID, LEVOTHROID Take 1 tablet (137 mcg total) by mouth daily before breakfast.   midodrine 5 MG tablet Commonly known as:  PROAMATINE Take 2 tablets (10 mg total) by mouth 3 (three) times daily with meals.   nitrofurantoin (macrocrystal-monohydrate) 100 MG capsule Commonly known as:  MACROBID Take 100 mg by mouth every 12 (twelve) hours. Stop date 08/25/16   pantoprazole 40 MG tablet Commonly known as:  PROTONIX Take 40 mg by mouth 2 (two) times daily.   polyethylene glycol packet Commonly known as:  MIRALAX / GLYCOLAX Take 17 g by mouth daily as needed for mild constipation.   pravastatin 40 MG tablet Commonly known as:  PRAVACHOL Take 40 mg by mouth at bedtime.   rOPINIRole 0.5 MG tablet Commonly known as:  REQUIP Take 1 tablet (0.5 mg total) by mouth 3 (three) times daily as needed (for restless leg  syndrome).   saccharomyces boulardii 250 MG capsule Commonly known as:  FLORASTOR Take 250 mg by mouth 2 (two) times daily. Stop date 08/25/16   senna 8.6 MG tablet Commonly known as:  SENOKOT Take 2 tablets by mouth daily as needed for constipation.   tamsulosin 0.4 MG Caps capsule Commonly known as:  FLOMAX TAKE ONE CAPSULE DAILY TO HELP IMPROVE URINARY FREQUENCY.   torsemide 10 MG tablet Commonly known as:  DEMADEX Take 10 mg by mouth daily as needed.   traZODone 50 MG tablet Commonly known as:  DESYREL Take 50 mg by mouth at bedtime.   Vitamin D3 5000 units Caps Take 5,000 Units by mouth every Monday.       Review of Systems  Constitutional: Negative for activity change, appetite change, chills, fatigue and fever.  Respiratory: Negative for cough, chest tightness, shortness of breath and wheezing.   Cardiovascular: Positive for leg swelling. Negative for chest pain and palpitations.  Gastrointestinal: Negative for abdominal distention, abdominal pain, constipation, diarrhea, nausea and vomiting.  Endocrine: Negative for polydipsia, polyphagia and polyuria.  Genitourinary: Negative for flank pain and urgency.       Indwelling foley catheter   Musculoskeletal: Positive for gait problem.  Skin: Negative for color change, pallor and rash.  Neurological: Negative for dizziness, light-headedness and headaches.  Hematological: Does not bruise/bleed easily.  Psychiatric/Behavioral: Negative for agitation, confusion, hallucinations and sleep disturbance. The patient is not nervous/anxious.     Immunization History  Administered Date(s) Administered  . Influenza-Unspecified 12/05/2013, 11/05/2014, 11/16/2015  . PPD Test 03/07/2014  . Pneumococcal-Unspecified 10/05/2009   Pertinent  Health Maintenance Due  Topic Date Due  . PNA vac Low Risk Adult (2 of 2 - PCV13) 02/04/2017 (Originally 10/06/2010)  . INFLUENZA VACCINE  09/04/2016  . DEXA SCAN  12/14/2024   Fall Risk   10/30/2015 09/05/2015 02/02/2015  Falls in the past year? No No No  Risk for fall due to : Impaired balance/gait - -    Vitals:   08/19/16 1040  BP: 114/70  Pulse: 72  Resp: 18  Temp: (!) 97.2 F (36.2 C)  TempSrc: Oral  SpO2: 95%  Weight: 180 lb 3.2 oz (81.7 kg)  Height: 5\' 10"  (1.778 m)   Body  mass index is 25.86 kg/m. Physical Exam  Constitutional: He is oriented to person, place, and time. He appears well-developed and well-nourished. No distress.  HENT:  Head: Normocephalic.  Mouth/Throat: Oropharynx is clear and moist. No oropharyngeal exudate.  Eyes: Pupils are equal, round, and reactive to light. Conjunctivae and EOM are normal. Right eye exhibits no discharge. Left eye exhibits no discharge. No scleral icterus.  Neck: Normal range of motion. No JVD present. No thyromegaly present.  Cardiovascular: Normal rate, regular rhythm, normal heart sounds and intact distal pulses.  Exam reveals no gallop and no friction rub.   No murmur heard. Pulmonary/Chest: Effort normal and breath sounds normal. No respiratory distress. He has no wheezes. He has no rales.  Abdominal: Soft. Bowel sounds are normal. He exhibits no distension. There is no tenderness. There is no rebound and no guarding.  Musculoskeletal: He exhibits no tenderness or deformity.  Unsteady gait uses rolling walker. 2+ pitting edema.   Lymphadenopathy:    He has no cervical adenopathy.  Neurological: He is oriented to person, place, and time.  Skin: Skin is warm and dry. No rash noted. No erythema.  Psychiatric: He has a normal mood and affect.   Labs reviewed:  Recent Labs  03/10/16 1443 03/11/16 0436  05/08/16 08/01/16 0022 08/15/16  NA 140 139  < > 142 137 141  K 3.5 3.7  < > 4.1 4.1 3.7  CL 104 108  --   --  105  --   CO2 29 25  --   --  27  --   GLUCOSE 96 105*  --   --  104*  --   BUN 22* 18  < > 13 15 14   CREATININE 1.03 0.72  < > 0.8 1.00 0.9  CALCIUM 8.7* 8.0*  --   --  8.5*  --   < > = values  in this interval not displayed.  Recent Labs  01/10/16 1400 03/10/16 1443 03/11/16 0436 04/03/16 05/08/16 08/15/16  AST 25 53* 46* 15 20 22   ALT 22 26 23 14 19 21   ALKPHOS 68 61 51 60 78 62  BILITOT 0.4 0.6 0.4  --   --   --   PROT 6.4* 6.9 5.8*  --   --   --   ALBUMIN 3.5 3.7 3.1*  --   --   --     Recent Labs  01/10/16 1400  03/10/16 1443  07/31/16 1359 08/01/16 0022 08/06/16 1641 08/15/16  WBC 6.0  < > 8.3  < > CANCELED 6.9 7.0 5.4  5.4  NEUTROABS 4.1  --  5.8  --  CANCELED  --   --   --   HGB 14.1  < > 13.1  < > CANCELED 12.9* 14.3 12.5*  12.5*  HCT 42.7  < > 39.5  < > CANCELED 38.3* 42.1 37*  37*  MCV 97.7  < > 95.6  < > CANCELED 94.3 94  --   PLT 190  < > 190  < > CANCELED 209 243 202  < > = values in this interval not displayed. Lab Results  Component Value Date   TSH 1.96 05/08/2016   Lab Results  Component Value Date   HGBA1C 5.6 12/07/2015   Lab Results  Component Value Date   CHOL 84 12/07/2015   HDL 41 12/07/2015   LDLCALC 23 12/07/2015   TRIG 102 12/07/2015   CHOLHDL 2.0 12/07/2015    Assessment/Plan 1. Acute cystitis with  hematuria Afebrile. urine analysis showed occult blood 2+,trace protein, Nitrite positive, leukocyte estrace 20-40 and few bacteria. Urine culture positive for 50,000-100,000 colonies of coagulase negative staphylococcus, not S. Saprophyticus.Macrobid 100 mg capsule twice daily x 1 week ordered. Florastor or 250 mg capsule twice daily x 1 week. Continue to encourage hydration.continue to monitor.    2. Localized edema 2+ edema to lower extremities. Ted hose recently ordered by MD. No shortness of breath or cough.Lungs CTA. No recent weight check to compare with previous. Will continue with thigh high Ted hose on in the morning and off at bedtime. Check weight daily x 1 week. Facility Nurse to notify provider for weight gain > 3 lbs in 2 days. Continue on furosemide 10 mg Tablet as needed for weight gain.    Family/ staff  Communication: Reviewed plan of care with patient and facility Nurse   Labs/tests ordered: None   Nelda Bucks Taneka Espiritu, NP

## 2016-08-19 NOTE — Telephone Encounter (Signed)
Note added to 08/27/16 appt notes to have device checked.

## 2016-08-19 NOTE — Telephone Encounter (Signed)
New Message  Pt wife states the nurse at [facility] ask that the pts monitor be checked when he comes in for his appt on 7/24. Please call back to discuss if needed.

## 2016-08-21 ENCOUNTER — Non-Acute Institutional Stay (SKILLED_NURSING_FACILITY): Payer: Medicare Other | Admitting: Family

## 2016-08-21 ENCOUNTER — Encounter: Payer: Self-pay | Admitting: Family

## 2016-08-21 DIAGNOSIS — G2581 Restless legs syndrome: Secondary | ICD-10-CM | POA: Diagnosis not present

## 2016-08-21 DIAGNOSIS — R1312 Dysphagia, oropharyngeal phase: Secondary | ICD-10-CM | POA: Diagnosis not present

## 2016-08-21 NOTE — Progress Notes (Signed)
Location:  [facility] Room Number: N06 Place of Service:  SNF (31) Provider: Anayely Constantine FNP-C  Blanchie Serve, MD  Patient Care Team: Blanchie Serve, MD as PCP - General (Internal Medicine) Croitoru, Dani Gobble, MD as Attending Physician (Cardiology) Vevelyn Royals, MD as Consulting Physician (Ophthalmology) Franchot Gallo, MD as Consulting Physician (Urology) Tanda Rockers, MD as Consulting Physician (Pulmonary Disease) Allyn Kenner, MD (Dermatology) Deliah Goody, PA-C as Physician Assistant (Physician Assistant) Makaela Cando, Nelda Bucks, NP as Nurse Practitioner (Family Medicine)  Extended Emergency Contact Information Primary Emergency Contact: [name],Adam [name] Address: [address]          [name] Phone: [phone] Mobile Phone: [phone] Relation: Spouse Secondary Emergency Contact: [name],Adam [name]  [place] Phone: [phone] Mobile Phone: [phone] Relation: Daughter  Code Status: Full Code  Goals of care: Advanced Directive information Advanced Directives 08/21/2016  Does Patient Have a Medical Advance Directive? Yes  Type of Paramedic of [place];Living will  Does patient want to make changes to medical advance directive? -  Copy of [place] in Chart? Yes  Would patient like information on creating a medical advance directive? -  Pre-existing out of facility DNR order (yellow form or pink MOST form) -     Chief Complaint  Patient presents with  . Acute Visit    trouble swallowing    HPI:  Pt is a 83 y.o. male seen today at [facility] for an acute visit for evaluation of trouble swallowing and worsening shaking of legs at bedtime. He is seen in his room today. He states had coughing episodes two days ago while eating. He also complains worsening restless leg at night keeps him awake. He is currently on Requip 0.5 mg Tablet  three times daily as needed.    Past Medical History:  Diagnosis Date  . Arthritis    "minor, back and sometimes knees" (12/15/2012)  . Bradycardia    AFib/SSS [name]/p St Jude PPM 04/12/2008  . CAD (coronary artery disease) 12/30/2014   CABG (LIMA-LAD, SVG-RCA, SVG-OM in 1996).  07/2009 BMS to SVG-RCA. Cath in 04/2010 with patent stents   . Cardiomyopathy, ischemic 08/25/2012  . CHF (congestive heart failure) ([place])   . Chronic knee pain 12/03/2014  . Combined congestive systolic and diastolic heart failure ([place]) 02/02/2015   Hx EF 41%. BNP 96.8 02/21/15 Torsemide 04/06/15 Na 142, K 4.6, Bun 16, creat 0.89 04/20/14 BNP 111.7, Na 142, K 4.6, Bun 16, creat 0.9   . Depression with anxiety 02/02/2015   02/21/15 Hgb A1c 5.8 03/10/15 MMSE 30/30   . Dizziness, after diuretic asscoiated with hypotension and responded to fluid bolus 06/05/2011   04/28/15 US carotid R+L normal bilateral arterial velocities.    Marland Kitchen Dyspnea 08/11/2014   Followed in Pulmonary clinic/ [facility]/ [place]  - 08/11/2014  Walked RA x 1 laps @ 185 ft each stopped due to fatigue/off balance/ slow pace/  no sob or desat  - PFT'[name]  09/26/2014  FEV1 2.26 (85 % ) ratio 76  p no % improvement from saba with DLCO  67 % corrects to 93 % for alv volume      Since prev study 08/04/13 minimal change lung vol or dlco    . Embolic cerebral infarction ([place]) 12/06/2015  . Exertional shortness of breath    "sometimes walking" (12/15/2012)  . GERD (gastroesophageal reflux disease)   . Gout 02/09/2015  . Heart  murmur    "just told I had one today" (12/15/2012)  . Hiatal hernia   . Hyperlipidemia   . Hypertension   . Hypothyroid   . Influenza A 03/10/2016  . Insomnia   . Melanoma of back ([place]) 1976  . Myocardial infarction [facility]) 1996; 2011   "both silent" (12/15/2012)  . Nonrheumatic aortic valve stenosis   . Orthostatic hypotension   . Osteoporosis, senile   . Pacemaker   . RBBB   . Restless leg 02/02/2015  . Right leg weakness 12/06/2015  . [facility] spotted fever   . [name]/P CABG x 4   . Sick sinus syndrome ([place]) 01/31/2014  . Sustained ventricular tachycardia ([place]) 07/27/2014  . Urinary retention 12/30/2014   Past Surgical History:  Procedure Laterality Date  . CARDIAC CATHETERIZATION  04/2010   LIMA to LAD patent,SVG to OM patent,no in-stnet restenosis RCA  . CATARACT EXTRACTION W/ INTRAOCULAR LENS  IMPLANT, BILATERAL Bilateral 2012  . CORONARY ANGIOPLASTY WITH STENT PLACEMENT  07/2009   bare metal stent to SVG to the RCA  . CORONARY ARTERY BYPASS GRAFT  1996   LIMA to LAD,SVG to RCA & SVG to OM  . INSERT / REPLACE / REMOVE PACEMAKER  2010  . MELANOMA EXCISION  05/1974 X2   "taken off my back" (12/15/2012)  . NM MYOVIEW LTD  06/2011   low risk  . TONSILLECTOMY  1938  . TRANSURETHRAL RESECTION OF PROSTATE  1986  . US ECHOCARDIOGRAPHY  07/11/2009   EF 45-50%    Allergies  Allergen Reactions  . Altace [Ramipril] Cough  . Crestor [Rosuvastatin Calcium] Rash  . Penicillins Rash and Other (See Comments)    Has patient had a PCN reaction causing immediate rash, facial/tongue/throat swelling, SOB or lightheadedness with hypotension: No Has patient had a PCN reaction causing severe rash involving mucus membranes or skin necrosis: Yes Has patient had a PCN reaction that required hospitalization Yes Has patient had a PCN reaction occurring within the last 10 years: No If all of the above answers are "NO", then may proceed with Cephalosporin use.   . Sulfa Antibiotics Rash    Allergies as of 08/21/2016      Reactions   Altace [ramipril] Cough   Crestor [rosuvastatin Calcium] Rash   Penicillins Rash, Other (See Comments)   Has patient had a PCN reaction causing immediate rash, facial/tongue/throat swelling, SOB or lightheadedness with hypotension: No Has patient had a PCN reaction causing severe rash involving mucus membranes or skin necrosis: Yes Has patient had a PCN reaction that required hospitalization Yes Has patient had  a PCN reaction occurring within the last 10 years: No If all of the above answers are "NO", then may proceed with Cephalosporin use.   Sulfa Antibiotics Rash      Medication List       Accurate as of 08/21/16  4:35 PM. Always use your most recent med list.          acetaminophen 325 MG tablet Commonly known as:  TYLENOL Take 650 mg by mouth every 8 (eight) hours as needed.   amiodarone 200 MG tablet Commonly known as:  PACERONE Take 400 mg by mouth daily.   aspirin EC 81 MG tablet Take 1 tablet (81 mg total) by mouth daily.   clopidogrel 75 MG tablet Commonly known as:  PLAVIX Take 1 tablet (75 mg total) by mouth daily.   fludrocortisone 0.1 MG tablet Commonly known as:  FLORINEF One daily to help supporrt  BP   guaiFENesin 600 MG 12 hr tablet Commonly known as:  MUCINEX Take 600 mg by mouth 2 (two) times daily.   levothyroxine 137 MCG tablet Commonly known as:  SYNTHROID, LEVOTHROID Take 1 tablet (137 mcg total) by mouth daily before breakfast.   midodrine 5 MG tablet Commonly known as:  PROAMATINE Take 2 tablets (10 mg total) by mouth 3 (three) times daily with meals.   nitrofurantoin (macrocrystal-monohydrate) 100 MG capsule Commonly known as:  MACROBID Take 100 mg by mouth every 12 (twelve) hours. Stop date 08/25/16   pantoprazole 40 MG tablet Commonly known as:  PROTONIX Take 40 mg by mouth 2 (two) times daily.   polyethylene glycol packet Commonly known as:  MIRALAX / GLYCOLAX Take 17 g by mouth daily as needed for mild constipation.   pravastatin 40 MG tablet Commonly known as:  PRAVACHOL Take 40 mg by mouth at bedtime.   rOPINIRole 0.5 MG tablet Commonly known as:  REQUIP Take 1 tablet (0.5 mg total) by mouth 3 (three) times daily as needed (for restless leg syndrome).   saccharomyces boulardii 250 MG capsule Commonly known as:  FLORASTOR Take 250 mg by mouth 2 (two) times daily. Stop date 08/25/16   senna 8.6 MG tablet Commonly known as:   SENOKOT Take 2 tablets by mouth daily as needed for constipation.   tamsulosin 0.4 MG Caps capsule Commonly known as:  FLOMAX TAKE ONE CAPSULE DAILY TO HELP IMPROVE URINARY FREQUENCY.   torsemide 10 MG tablet Commonly known as:  DEMADEX Take 10 mg by mouth daily as needed.   traZODone 50 MG tablet Commonly known as:  DESYREL Take 50 mg by mouth at bedtime.   Vitamin D3 5000 units Caps Take 5,000 Units by mouth every Monday.       Review of Systems  Constitutional: Negative for activity change, appetite change, chills, fatigue and fever.  HENT: Positive for trouble swallowing. Negative for congestion, rhinorrhea, sinus pain, sinus pressure, sneezing and sore throat.   Respiratory: Negative for cough, chest tightness, shortness of breath and wheezing.   Cardiovascular: Positive for leg swelling. Negative for chest pain and palpitations.  Gastrointestinal: Negative for abdominal distention, abdominal pain, constipation, diarrhea, nausea and vomiting.  Genitourinary: Negative for flank pain and urgency.       Indwelling foley catheter   Musculoskeletal: Positive for gait problem.       Worsening restless leg at night  Skin: Negative for color change, pallor and rash.  Neurological: Negative for dizziness, light-headedness and headaches.       Worsening restless leg at night   Psychiatric/Behavioral: Negative for agitation, confusion, hallucinations and sleep disturbance. The patient is not nervous/anxious.     Immunization History  Administered Date([name]) Administered  . Influenza-Unspecified 12/05/2013, 11/05/2014, 11/16/2015  . PPD Test 03/07/2014  . Pneumococcal-Unspecified 10/05/2009   Pertinent  Health Maintenance Due  Topic Date Due  . PNA vac Low Risk Adult (2 of 2 - PCV13) 02/04/2017 (Originally 10/06/2010)  . INFLUENZA VACCINE  09/04/2016  . DEXA SCAN  12/14/2024   Fall Risk  10/30/2015 09/05/2015 02/02/2015  Falls in the past year? No No No  Risk for fall due to :  Impaired balance/gait - -    Vitals:   08/21/16 1352  BP: (!) 96/50  Pulse: 68  Resp: 18  Temp: 98.6 F (37 C)  SpO2: 92%  Weight: 181 lb 8 oz (82.3 kg)  Height: 5\' 10"  (1.778 m)   Body mass index is 26.04  kg/m. Physical Exam  Constitutional: He is oriented to person, place, and time. He appears well-developed and well-nourished. No distress.  HENT:  Head: Normocephalic.  Mouth/Throat: Oropharynx is clear and moist. No oropharyngeal exudate.  Eyes: Pupils are equal, round, and reactive to light. Conjunctivae and EOM are normal. Right eye exhibits no discharge. Left eye exhibits no discharge. No scleral icterus.  Neck: Normal range of motion. No JVD present. No thyromegaly present.  Cardiovascular: Normal rate, regular rhythm, normal heart sounds and intact distal pulses.  Exam reveals no gallop and no friction rub.   No murmur heard. Pulmonary/Chest: Effort normal and breath sounds normal. No respiratory distress. He has no wheezes. He has no rales.  Abdominal: Soft. Bowel sounds are normal. He exhibits no distension. There is no tenderness. There is no rebound and no guarding.  Musculoskeletal: He exhibits no tenderness or deformity.  Moves x 4 extremities.Bilateral lower extremities Ted hose in place.   Lymphadenopathy:    He has no cervical adenopathy.  Neurological: He is oriented to person, place, and time.  Skin: Skin is warm and dry. No rash noted. No erythema.  Psychiatric: He has a normal mood and affect.    Labs reviewed:  Recent Labs  03/10/16 1443 03/11/16 0436  05/08/16 08/01/16 0022 08/15/16  NA 140 139  < > 142 137 141  K 3.5 3.7  < > 4.1 4.1 3.7  CL 104 108  --   --  105  --   CO2 29 25  --   --  27  --   GLUCOSE 96 105*  --   --  104*  --   BUN 22* 18  < > 13 15 14   CREATININE 1.03 0.72  < > 0.8 1.00 0.9  CALCIUM 8.7* 8.0*  --   --  8.5*  --   < > = values in this interval not displayed.  Recent Labs  01/10/16 1400 03/10/16 1443 03/11/16 0436  04/03/16 05/08/16 08/15/16  AST 25 53* 46* 15 20 22   ALT 22 26 23 14 19 21   ALKPHOS 68 61 51 60 78 62  BILITOT 0.4 0.6 0.4  --   --   --   PROT 6.4* 6.9 5.8*  --   --   --   ALBUMIN 3.5 3.7 3.1*  --   --   --     Recent Labs  01/10/16 1400  03/10/16 1443  07/31/16 1359 08/01/16 0022 08/06/16 1641 08/15/16  WBC 6.0  < > 8.3  < > CANCELED 6.9 7.0 5.4  5.4  NEUTROABS 4.1  --  5.8  --  CANCELED  --   --   --   HGB 14.1  < > 13.1  < > CANCELED 12.9* 14.3 12.5*  12.5*  HCT 42.7  < > 39.5  < > CANCELED 38.3* 42.1 37*  37*  MCV 97.7  < > 95.6  < > CANCELED 94.3 94  --   PLT 190  < > 190  < > CANCELED 209 243 202  < > = values in this interval not displayed. Lab Results  Component Value Date   TSH 1.96 05/08/2016   Lab Results  Component Value Date   HGBA1C 5.6 12/07/2015   Lab Results  Component Value Date   CHOL 84 12/07/2015   HDL 41 12/07/2015   LDLCALC 23 12/07/2015   TRIG 102 12/07/2015   CHOLHDL 2.0 12/07/2015    Significant Diagnostic Results in last 30  days:  None  Assessment/Plan 1. RLS (restless legs syndrome) Worsening at bedtime.currently on Requip 0.5 mg Tablet three times daily as needed.He was recently started on midodrine  10 mg Tablet three times daily. Due to low his blood pressure will keep Requip as needed to prevent hypotension.   2. Oropharyngeal dysphagia Reports coughing with meals.will consult with Speech therapy to evaluate and treat as indicated. Continue to monitor for Aspiration precautions.   Family/ staff Communication: Reviewed plan of care with patient and facility Nurse  Labs/tests ordered: None   Nelda Bucks Zakyah Yanes, NP

## 2016-08-23 ENCOUNTER — Telehealth: Payer: Self-pay | Admitting: Family

## 2016-08-23 NOTE — Telephone Encounter (Signed)
Patient's daughter Adam [name] Tel # [phone] called facility Nurse request provider to call back to update on recent result for ECHO and other test. Phone call returned Discussed with patient's daughter ECHO preliminary report awaiting Cardiologist review.Has upcoming appointment with cardiology. CXR 08/14/2016 was negative for PNA. Showed cardiomegaly and moderate Hernia.  Patient's daughter concerns that patient's has occasional body tremors would like to be evaluated for parkinson's disease.He has upcoming Neurologist appointment. Also request Trazodone increased to 75mg  Tablet at bedtime for insomnia.

## 2016-08-27 ENCOUNTER — Encounter: Payer: Self-pay | Admitting: Physician Assistant

## 2016-08-27 ENCOUNTER — Encounter: Payer: Self-pay | Admitting: Cardiovascular Disease

## 2016-08-27 ENCOUNTER — Ambulatory Visit (INDEPENDENT_AMBULATORY_CARE_PROVIDER_SITE_OTHER): Payer: Medicare Other | Admitting: Physician Assistant

## 2016-08-27 VITALS — BP 120/69 | HR 70 | Ht 70.0 in | Wt 182.6 lb

## 2016-08-27 DIAGNOSIS — I472 Ventricular tachycardia, unspecified: Secondary | ICD-10-CM

## 2016-08-27 DIAGNOSIS — I951 Orthostatic hypotension: Secondary | ICD-10-CM

## 2016-08-27 DIAGNOSIS — Z950 Presence of cardiac pacemaker: Secondary | ICD-10-CM | POA: Diagnosis not present

## 2016-08-27 DIAGNOSIS — Q231 Congenital insufficiency of aortic valve: Secondary | ICD-10-CM

## 2016-08-27 DIAGNOSIS — Q230 Congenital stenosis of aortic valve: Secondary | ICD-10-CM | POA: Diagnosis not present

## 2016-08-27 DIAGNOSIS — I5042 Chronic combined systolic (congestive) and diastolic (congestive) heart failure: Secondary | ICD-10-CM

## 2016-08-27 DIAGNOSIS — I519 Heart disease, unspecified: Secondary | ICD-10-CM

## 2016-08-27 NOTE — Patient Instructions (Signed)
Medication Instructions: We will be working on the [employer] application and keep you updated.  Follow-Up: Your physician recommends that you schedule a follow-up appointment in: 2 months with Almyra Deforest, PA   If you need a refill on your cardiac medications before your next appointment, please call your pharmacy.

## 2016-08-27 NOTE — Progress Notes (Signed)
Cardiology Office Note    Date:  08/28/2016   ID:  Adam [name], DOB [birth year], MRN [id number]  PCP:  Blanchie Serve, MD  Cardiologist:  Dr. Sallyanne Kuster  Chief Complaint  Patient presents with  . Follow-up    seen for Dr. Sallyanne Kuster    History of Present Illness:  Adam [name] is a 83 y.o. male with PMH of CAD s/p CABG in 1996, ICM, combined systolic and diastolic heart failure, orthostatic hypotension that was felt to be neurogenic in etiology, SSS s/p PPM 2010, mild dementia, and VT on amiodarone. He had placement of a stent to SVG to RCA in 2011. Her last cardiac catheterization in 2012 showed patent grafts but all native coronary arteries were occluded. Last Myoview in March 2017 showed EF 52%, medium defect of severe severity present in the basal inferior, basal inferolateral, mid inferior, and mid inferolateral location consistent with prior MI, no ischemia was seen. In June 2016, he was seen for near syncope, dyspnea and extreme fatigue, he did not have loss of consciousness. Interrogation of his pacemaker showed relatively slow ventricular tachycardia around 150 bpm which seems to be regular and the monomorphic. His amiodarone was increased and he has not had any significant ventricular tachycardia since. [facility] In November 2017, he presented with the right lower extremity weakness and was diagnosed with ischemic stroke. He was prescribed eliquis due to suspicion of stroke related to atrial fibrillation, however later device interrogation showed no signs of atrial fibrillation. He was last seen by Dr. Sallyanne Kuster on 03/04/2016, his device was functioning normally. He has 99% atrial pacing and 60% ventricular pacing. There was no atrial fibrillation or ventricular tachycardia on interrogation.  I last saw the patient on 08/06/2016, he was having significant amount of dizziness with orthostatic hypotension. I increased his medical regimen to 10 mg 3 times a day from 10/5/5,  unfortunately, this caused hypertension instead. He has went back to 10/5/5. Dr. Sallyanne Kuster and I have been discussing the possibility of using Nothera as an alternative for his significant orthostatic hypotension. Recent device interrogation also showed 3 month until ERI. Based on records brought by the patient, his primary care physician has recently ordered echocardiogram done at outside facility.  This showed ejection fraction of 35%, which is down from the previous echocardiogram on 01/12/2016 which showed 45%. However patient is asymptomatic, he denies any recent chest discomfort or worsening dyspnea on exertion. I did discussed the case both with the patient and Dr. Sallyanne Kuster, I do not think he would be a very good candidate for cardiac catheterization. And if the ultimate goal is not cardiac catheterization and he is not having any symptom, I would hold off on stress testing as well. Patient is also agreeable to this plan. I did discussed case with our pharmacist who will try to get him approved for [name]. According to Dr. Sallyanne Kuster, he will need monthly interrogations.   Past Medical History:  Diagnosis Date  . Arthritis    "minor, back and sometimes knees" (12/15/2012)  . Bradycardia    AFib/SSS s/p St Jude PPM 04/12/2008  . CAD (coronary artery disease) 12/30/2014   CABG (LIMA-LAD, SVG-RCA, SVG-OM in 1996).  07/2009 BMS to SVG-RCA. Cath in 04/2010 with patent stents   . Cardiomyopathy, ischemic 08/25/2012  . CHF (congestive heart failure) ([place])   . Chronic knee pain 12/03/2014  . Combined congestive systolic and diastolic heart failure ([place]) 02/02/2015   Hx EF 41%. BNP 96.8 02/21/15  Torsemide 04/06/15 Na 142, K 4.6, Bun 16, creat 0.89 04/20/14 BNP 111.7, Na 142, K 4.6, Bun 16, creat 0.9   . Depression with anxiety 02/02/2015   02/21/15 Hgb A1c 5.8 03/10/15 MMSE 30/30   . Dizziness, after diuretic asscoiated with hypotension and responded to fluid bolus 06/05/2011   04/28/15 US carotid R+L normal  bilateral arterial velocities.    Marland Kitchen Dyspnea 08/11/2014   Followed in Pulmonary clinic/ [facility]/ [place]  - 08/11/2014  Walked RA x 1 laps @ 185 ft each stopped due to fatigue/off balance/ slow pace/  no sob or desat  - PFT's  09/26/2014  FEV1 2.26 (85 % ) ratio 76  p no % improvement from saba with DLCO  67 % corrects to 93 % for alv volume      Since prev study 08/04/13 minimal change lung vol or dlco    . Embolic cerebral infarction ([place]) 12/06/2015  . Exertional shortness of breath    "sometimes walking" (12/15/2012)  . GERD (gastroesophageal reflux disease)   . Gout 02/09/2015  . Heart murmur    "just told I had one today" (12/15/2012)  . Hiatal hernia   . Hyperlipidemia   . Hypertension   . Hypothyroid   . Influenza A 03/10/2016  . Insomnia   . Melanoma of back ([place]) 1976  . Myocardial infarction [facility]) 1996; 2011   "both silent" (12/15/2012)  . Nonrheumatic aortic valve stenosis   . Orthostatic hypotension   . Osteoporosis, senile   . Pacemaker   . RBBB   . Restless leg 02/02/2015  . Right leg weakness 12/06/2015  . [facility] spotted fever   . S/P CABG x 4   . Sick sinus syndrome ([place]) 01/31/2014  . Sustained ventricular tachycardia ([place]) 07/27/2014  . Urinary retention 12/30/2014    Past Surgical History:  Procedure Laterality Date  . CARDIAC CATHETERIZATION  04/2010   LIMA to LAD patent,SVG to OM patent,no in-stnet restenosis RCA  . CATARACT EXTRACTION W/ INTRAOCULAR LENS  IMPLANT, BILATERAL Bilateral 2012  . CORONARY ANGIOPLASTY WITH STENT PLACEMENT  07/2009   bare metal stent to SVG to the RCA  . CORONARY ARTERY BYPASS GRAFT  1996   LIMA to LAD,SVG to RCA & SVG to OM  . INSERT / REPLACE / REMOVE PACEMAKER  2010  . MELANOMA EXCISION  05/1974 X2   "taken off my back" (12/15/2012)  . NM MYOVIEW LTD  06/2011   low risk  . TONSILLECTOMY  1938  . TRANSURETHRAL RESECTION OF PROSTATE  1986  . US ECHOCARDIOGRAPHY  07/11/2009   EF 45-50%    Current  Medications: Outpatient Medications Prior to Visit  Medication Sig Dispense Refill  . acetaminophen (TYLENOL) 325 MG tablet Take 650 mg by mouth every 8 (eight) hours as needed.     Marland Kitchen amiodarone (PACERONE) 200 MG tablet Take 400 mg by mouth daily.    Marland Kitchen aspirin EC 81 MG tablet Take 1 tablet (81 mg total) by mouth daily. 30 tablet 1  . Cholecalciferol (VITAMIN D3) 5000 UNITS CAPS Take 5,000 Units by mouth every Monday.     . clopidogrel (PLAVIX) 75 MG tablet Take 1 tablet (75 mg total) by mouth daily. 30 tablet 2  . fludrocortisone (FLORINEF) 0.1 MG tablet One daily to help supporrt BP 30 tablet 5  . guaiFENesin (MUCINEX) 600 MG 12 hr tablet Take 600 mg by mouth 2 (two) times daily.     Marland Kitchen levothyroxine (SYNTHROID, LEVOTHROID) 137 MCG tablet Take  1 tablet (137 mcg total) by mouth daily before breakfast. 90 tablet 3  . midodrine (PROAMATINE) 5 MG tablet Take 2 tablets (10 mg total) by mouth 3 (three) times daily with meals. 180 tablet 3  . pantoprazole (PROTONIX) 40 MG tablet Take 40 mg by mouth 2 (two) times daily.    . polyethylene glycol (MIRALAX / GLYCOLAX) packet Take 17 g by mouth daily as needed for mild constipation.     . pravastatin (PRAVACHOL) 40 MG tablet Take 40 mg by mouth at bedtime.    Marland Kitchen rOPINIRole (REQUIP) 0.5 MG tablet Take 1 tablet (0.5 mg total) by mouth 3 (three) times daily as needed (for restless leg syndrome). 90 tablet 1  . senna (SENOKOT) 8.6 MG tablet Take 2 tablets by mouth daily as needed for constipation.     . tamsulosin (FLOMAX) 0.4 MG CAPS capsule TAKE ONE CAPSULE DAILY TO HELP IMPROVE URINARY FREQUENCY. 30 capsule 0  . torsemide (DEMADEX) 10 MG tablet Take 10 mg by mouth daily as needed.    . traZODone (DESYREL) 50 MG tablet Take 50 mg by mouth at bedtime.    . nitrofurantoin, macrocrystal-monohydrate, (MACROBID) 100 MG capsule Take 100 mg by mouth every 12 (twelve) hours. Stop date 08/25/16    . saccharomyces boulardii (FLORASTOR) 250 MG capsule Take 250 mg by mouth  2 (two) times daily. Stop date 08/25/16     No facility-administered medications prior to visit.      Allergies:   Altace [ramipril]; Crestor [rosuvastatin calcium]; Penicillins; and Sulfa antibiotics   Social History   Social History  . Marital status: Married    Spouse name: N/A  . Number of children: 2  . Years of education: Masters   Occupational History  . Retired Company secretary -Pensions consultant    Social History Main Topics  . Smoking status: Never Smoker  . Smokeless tobacco: Never Used  . Alcohol use No  . Drug use: No  . Sexual activity: No   Other Topics Concern  . None   Social History Narrative   Lives at [place] 01/09/15   Married - Violet   Never smoked   Alcohol none   Previously employed as Scientist, forensic for [employer].         Diet:Low sodium   Do you drink/eat things with caffeine? No   Marital status: Married                              What year were you married?1950   Do you live in a house, apartment, assisted living, condo, trailer, etc)?    Is it one or more stories? 1   How many persons live in your home? 2   Do you have any pets in your home? No   Current or past profession: Minister, [employer]   Do you exercise?     Very Little                                                 Type & how often:    Do you have a living will?  Yes   Do you have a DNR Form? Yes   Do you have a POA/HPOA forms? Yes     Family History:  The patient's family history  includes Anuerysm in his son; Coronary artery disease in his father and mother; Diabetes in his father and mother; Heart disease in his brother, father, and mother; Lung cancer in his father.   ROS:   Please see the history of present illness.    ROS All other systems reviewed and are negative.   PHYSICAL EXAM:   VS:  BP 120/69 (BP Location: Left Arm, Cuff Size: Normal)   Pulse 70   Ht 5\' 10"  (1.778 m)   Wt 182 lb 9.6 oz (82.8 kg)   BMI 26.20 kg/m     GEN: Well nourished, well developed, in no acute distress  HEENT: normal  Neck: no JVD, carotid bruits, or masses Cardiac: RRR; no murmurs, rubs, or gallops,no edema  Respiratory:  clear to auscultation bilaterally, normal work of breathing GI: soft, nontender, nondistended, + BS MS: no deformity or atrophy  Skin: warm and dry, no rash Neuro:  Alert and Oriented x 3, Strength and sensation are intact Psych: euthymic mood, full affect  Wt Readings from Last 3 Encounters:  08/27/16 182 lb 9.6 oz (82.8 kg)  08/21/16 181 lb 8 oz (82.3 kg)  08/19/16 180 lb 3.2 oz (81.7 kg)      Studies/Labs Reviewed:   EKG:  EKG is ordered today.  The ekg ordered today demonstrates Atrial paced rhythm  Recent Labs: 05/08/2016: TSH 1.96 08/15/2016: ALT 21; BUN 14; Creatinine 0.9; Hemoglobin 12.5; Hemoglobin 12.5; Platelets 202; Potassium 3.7; Sodium 141   Lipid Panel    Component Value Date/Time   CHOL 84 12/07/2015 0449   TRIG 102 12/07/2015 0449   HDL 41 12/07/2015 0449   CHOLHDL 2.0 12/07/2015 0449   VLDL 20 12/07/2015 0449   LDLCALC 23 12/07/2015 0449    Additional studies/ records that were reviewed today include:   Cath 04/13/2010 CONCLUSION AND FINDINGS: 1. AO pressure 97/56. 2. Coronary. The native right coronary was 100% occluded in its origin and in its proximal portion. The left main bifurcated into LAD and circumflex. Left main was patent. The left circumflex was 100% occluded in its origin and in its proximal portion as was the LAD. Bypass graft to the LIMA, to the LAD was patent. There was retrograde flowing from the distal LAD to proximal LAD through the LIMA without difficulty. There is no significant disease. The SVG to the RCA was patent and mildly distended in its midportion of the body was patent. It caused retrograde filling of the native RCA branch with proximal portion above the 50% stenosis of the native artery prior to the bifurcation of PLA and PDA. Saphenous vein  graft to OM was patent. The OM itself was patent all the way through. There was no significant disease.  FINAL CONCLUSION: There is patent LIMA to LAD, patent SVG to OM, patent SVG to RCA, native vessel 100% occluded, LAD 100% occluded, circumflex 100% occluded, and 50% distal RCA.  PLAN: Medical therapy. There is nothing to intervene upon. He has low risk coronary anatomy and he will otherwise do well.   Myoview 04/12/2015 Study Highlights    Nuclear stress EF: 32%.  There was no ST segment deviation noted during stress.  Defect 1: There is a medium defect of severe severity present in the basal inferior, basal inferolateral, mid inferior and mid inferolateral location.  Findings consistent with prior myocardial infarction.  This is an intermediate risk study.  The left ventricular ejection fraction is moderately decreased (30-44%).  Akinesis of the basal and mid inferior and  inferolateral walls and paradoxical septal motion.  There is a sacr in the basal and mid inferior and inferolateral leads with associated akinesis. No ischemia.     Echo 01/12/2016 LV EF: 45% - 50%  Study Conclusions  - Left ventricle: The cavity size was normal. There was moderate focal basal hypertrophy. Systolic function was mildly reduced. The estimated ejection fraction was in the range of 45% to 50%. There is akinesis of the basal-midinferolateral and inferior myocardium. The study is not technically sufficient to allow evaluation of LV diastolic function. - Aortic valve: Possibly bicuspid. Moderate diffuse thickening and calcification. Valve mobility was restricted. There was moderate to severe stenosis. There was trivial regurgitation. Valve area (VTI): 1.09 cm^2. Valve area (Vmax): 1.01 cm^2. Valve area (Vmean): 0.97 cm^2. - Mitral valve: Mild focal calcification of the anterior leaflet. There was mild regurgitation. - Left atrium: The atrium was  mildly dilated.     ASSESSMENT:    1. Orthostatic hypotension   2. LV dysfunction   3. Chronic combined systolic and diastolic heart failure ([place])   4. V-tach ([place])   5. Pacemaker   6. Aortic stenosis with bicuspid valve      PLAN:  In order of problems listed above:  1. Severe orthostatic hypotension: We'll try to get him approved for [name] for neurogenic orthostatic hypotension  2. Worsening LV dysfunction: Previous echocardiogram in December 2017 showed EF 45%, however recent echo obtained at outside facility showed EF down to 35%, he denies any recent chest pain. I did discussed the case both with the patient and Dr. Sallyanne Kuster, I think the patient is a poor candidate for extensive invasive workup on top of this possible pacemaker change out. At this time I do not recommend cardiac catheterization, therefore I wish to hold off on stress testing as well.  3. Chronic combined systolic and diastolic heart failure: EF 45-50% by echo in December 2017, appears to be euvolemic on physical exam.  4. History of VT on amiodarone: Continue amiodarone  5. SSS s/p PPM: Recent device interrogation shows 3 mouth to ERI, monthly interrogation  6. Moderate to severe aortic stenosis with bicuspid valve: He has a known heart murmur since age 75. Currently not a surgical candidate    Medication Adjustments/Labs and Tests Ordered: Current medicines are reviewed at length with the patient today.  Concerns regarding medicines are outlined above.  Medication changes, Labs and Tests ordered today are listed in the Patient Instructions below. Patient Instructions  Medication Instructions: We will be working on the [employer] application and keep you updated.  Follow-Up: Your physician recommends that you schedule a follow-up appointment in: 2 months with Almyra Deforest, PA   If you need a refill on your cardiac medications before your next appointment, please call your pharmacy.      Hilbert Corrigan, [place]  08/28/2016 11:20 PM    [facility] [address], [place], [place]  [address] Phone: [phone]; Fax: [phone]

## 2016-08-28 ENCOUNTER — Encounter: Payer: Self-pay | Admitting: Physician Assistant

## 2016-08-28 ENCOUNTER — Telehealth: Payer: Self-pay | Admitting: Physician Assistant

## 2016-08-28 NOTE — Telephone Encounter (Signed)
Spoke to [place] after calling [facility], this is not related to the [place] PA - this is in regards to nursing home face sheet which he gave to [place] yesterday to fax back to [facility]. Routed for f/u.

## 2016-08-28 NOTE — Telephone Encounter (Signed)
Adam [name]( [facility] ) is calling about the a paper form that was to be sent back to them . Did not receive the form . Please fax to [phone]

## 2016-08-29 ENCOUNTER — Encounter: Payer: Self-pay | Admitting: Internal Medicine

## 2016-08-29 ENCOUNTER — Non-Acute Institutional Stay (SKILLED_NURSING_FACILITY): Payer: Medicare Other | Admitting: Internal Medicine

## 2016-08-29 DIAGNOSIS — I495 Sick sinus syndrome: Secondary | ICD-10-CM

## 2016-08-29 DIAGNOSIS — R197 Diarrhea, unspecified: Secondary | ICD-10-CM | POA: Diagnosis not present

## 2016-08-29 DIAGNOSIS — R600 Localized edema: Secondary | ICD-10-CM | POA: Diagnosis not present

## 2016-08-29 DIAGNOSIS — R2681 Unsteadiness on feet: Secondary | ICD-10-CM

## 2016-08-29 DIAGNOSIS — S81801A Unspecified open wound, right lower leg, initial encounter: Secondary | ICD-10-CM

## 2016-08-29 DIAGNOSIS — R4189 Other symptoms and signs involving cognitive functions and awareness: Secondary | ICD-10-CM | POA: Diagnosis not present

## 2016-08-29 DIAGNOSIS — I5042 Chronic combined systolic (congestive) and diastolic (congestive) heart failure: Secondary | ICD-10-CM | POA: Diagnosis not present

## 2016-08-29 NOTE — Telephone Encounter (Signed)
Paper work was faxed back to [facility] with pt appointment

## 2016-08-29 NOTE — Progress Notes (Signed)
Provider:  Blanchie Serve MD  Location:  [facility] Room Number: 6 Place of Service:  SNF (31)  PCP: Blanchie Serve, MD Patient Care Team: Blanchie Serve, MD as PCP - General (Internal Medicine) Croitoru, Dani Gobble, MD as Attending Physician (Cardiology) Vevelyn Royals, MD as Consulting Physician (Ophthalmology) Franchot Gallo, MD as Consulting Physician (Urology) Tanda Rockers, MD as Consulting Physician (Pulmonary Disease) Allyn Kenner, MD (Dermatology) Deliah Goody, PA-C as Physician Assistant (Physician Assistant) Ngetich, Nelda Bucks, NP as Nurse Practitioner (Family Medicine)  Extended Emergency Contact Information Primary Emergency Contact: Cihlar,[name] Address: [address]          [place] Phone: [phone] Mobile Phone: [phone] Relation: Spouse Secondary Emergency Contact: [name],[place]ato  [place] Phone: [phone] Mobile Phone: [phone] Relation: Daughter  Code Status: Full Code  Goals of Care: Advanced Directive information Advanced Directives 08/29/2016  Does Patient Have a Medical Advance Directive? -  Type of Advance Directive [place]  Does patient want to make changes to medical advance directive? -  Copy of [place] in Chart? Yes  Would patient like information on creating a medical advance directive? -  Pre-existing out of facility DNR order (yellow form or pink MOST form) -      Chief Complaint  Patient presents with  . Acute Visit    Family concerns    HPI: Patient is a 83 y.o. male seen today for acute visit.   Loose stool- He complaints of loose stool 3-4 times a day x 3 weeks. Nursing and wife have not noticed this. Denies blood in stool.  Right leg sore- He also complaints of soreness to his right leg. Denies any trauma to right leg. He gets around with his wheelchair and with a walker with assistance.    Unsteady gait-He has been working with therapy team and made improvement in his strength. He still needs assistance with his ADLs. He would like to be discharged back to ALF.  Dizziness- has it occasionally. Slightly improved bp reading with increase in midodrine.   CHF- EF 35% on review of echocardiogram result with severe LV global dysfunction. Has dyspnea with exertion.     Past Medical History:  Diagnosis Date  . Arthritis    "minor, back and sometimes knees" (12/15/2012)  . Bradycardia    AFib/SSS s/p St Jude PPM 04/12/2008  . CAD (coronary artery disease) 12/30/2014   CABG (LIMA-LAD, SVG-RCA, SVG-OM in 1996).  07/2009 BMS to SVG-RCA. Cath in 04/2010 with patent stents   . Cardiomyopathy, ischemic 08/25/2012  . CHF (congestive heart failure) ([place])   . Chronic knee pain 12/03/2014  . Combined congestive systolic and diastolic heart failure ([place]) 02/02/2015   Hx EF 41%. BNP 96.8 02/21/15 Torsemide 04/06/15 Na 142, K 4.6, Bun 16, creat 0.89 04/20/14 BNP 111.7, Na 142, K 4.6, Bun 16, creat 0.9   . Depression with anxiety 02/02/2015   02/21/15 Hgb A1c 5.8 03/10/15 MMSE 30/30   . Dizziness, after diuretic asscoiated with hypotension and responded to fluid bolus 06/05/2011   04/28/15 US carotid R+L normal bilateral arterial velocities.    Marland Kitchen Dyspnea 08/11/2014   Followed in Pulmonary clinic/ [facility]/ [place]  - 08/11/2014  Walked RA x 1 laps @ 185 ft each stopped due to fatigue/off balance/ slow pace/  no sob or desat  - PFT's  09/26/2014  FEV1 2.26 (85 % ) ratio 76  p  no % improvement from saba with DLCO  67 % corrects to 93 % for alv volume      Since prev study 08/04/13 minimal change lung vol or dlco    . Embolic cerebral infarction ([place]) 12/06/2015  . Exertional shortness of breath    "sometimes walking" (12/15/2012)  . GERD (gastroesophageal reflux disease)   . Gout 02/09/2015  . Heart murmur    "just told I had one today" (12/15/2012)  . Hiatal hernia   . Hyperlipidemia   . Hypertension    . Hypothyroid   . Influenza A 03/10/2016  . Insomnia   . Melanoma of back ([place]) 1976  . Myocardial infarction [facility]) 1996; 2011   "both silent" (12/15/2012)  . Nonrheumatic aortic valve stenosis   . Orthostatic hypotension   . Osteoporosis, senile   . Pacemaker   . RBBB   . Restless leg 02/02/2015  . Right leg weakness 12/06/2015  . [facility] spotted fever   . S/P CABG x 4   . Sick sinus syndrome ([place]) 01/31/2014  . Sustained ventricular tachycardia ([place]) 07/27/2014  . Urinary retention 12/30/2014   Past Surgical History:  Procedure Laterality Date  . CARDIAC CATHETERIZATION  04/2010   LIMA to LAD patent,SVG to OM patent,no in-stnet restenosis RCA  . CATARACT EXTRACTION W/ INTRAOCULAR LENS  IMPLANT, BILATERAL Bilateral 2012  . CORONARY ANGIOPLASTY WITH STENT PLACEMENT  07/2009   bare metal stent to SVG to the RCA  . CORONARY ARTERY BYPASS GRAFT  1996   LIMA to LAD,SVG to RCA & SVG to OM  . INSERT / REPLACE / REMOVE PACEMAKER  2010  . MELANOMA EXCISION  05/1974 X2   "taken off my back" (12/15/2012)  . NM MYOVIEW LTD  06/2011   low risk  . TONSILLECTOMY  1938  . TRANSURETHRAL RESECTION OF PROSTATE  1986  . US ECHOCARDIOGRAPHY  07/11/2009   EF 45-50%    reports that he has never smoked. He has never used smokeless tobacco. He reports that he does not drink alcohol or use drugs. Social History   Social History  . Marital status: Married    Spouse name: N/A  . Number of children: 2  . Years of education: Masters   Occupational History  . Retired Company secretary -Pensions consultant    Social History Main Topics  . Smoking status: Never Smoker  . Smokeless tobacco: Never Used  . Alcohol use No  . Drug use: No  . Sexual activity: No   Other Topics Concern  . Not on file   Social History Narrative   Lives at [place] to [place] 01/09/15   Married - [place]   Never smoked   Alcohol none   Previously employed as Scientist, forensic for [employer].          Diet:Low sodium   Do you drink/eat things with caffeine? No   Marital status: Married                              What year were you married?1950   Do you live in a house, apartment, assisted living, condo, trailer, etc)?    Is it one or more stories? 1   How many persons live in your home? 2   Do you have any pets in your home? No   Current or past profession: Minister, [employer]   Do you exercise?     Very Little  Type & how often:    Do you have a living will?  Yes   Do you have a DNR Form? Yes   Do you have a POA/HPOA forms? Yes    Functional Status Survey:    Family History  Problem Relation Age of Onset  . Coronary artery disease Mother   . Diabetes Mother   . Heart disease Mother   . Coronary artery disease Father   . Diabetes Father   . Lung cancer Father   . Heart disease Father   . Anuerysm Son   . Heart disease Brother     Health Maintenance  Topic Date Due  . PNA vac Low Risk Adult (2 of 2 - PCV13) 02/04/2017 (Originally 10/06/2010)  . TETANUS/TDAP  08/01/2026 (Originally 01/08/1946)  . INFLUENZA VACCINE  09/04/2016  . DEXA SCAN  12/14/2024    Allergies  Allergen Reactions  . Altace [Ramipril] Cough  . Crestor [Rosuvastatin Calcium] Rash  . Penicillins Rash and Other (See Comments)    Has patient had a PCN reaction causing immediate rash, facial/tongue/throat swelling, SOB or lightheadedness with hypotension: No Has patient had a PCN reaction causing severe rash involving mucus membranes or skin necrosis: Yes Has patient had a PCN reaction that required hospitalization Yes Has patient had a PCN reaction occurring within the last 10 years: No If all of the above answers are "NO", then may proceed with Cephalosporin use.   . Sulfa Antibiotics Rash    Outpatient Encounter Prescriptions as of 08/29/2016  Medication Sig  . acetaminophen (TYLENOL) 325 MG tablet Take 650 mg by mouth every 8 (eight)  hours as needed.   Marland Kitchen amiodarone (PACERONE) 200 MG tablet Take 400 mg by mouth daily.  Marland Kitchen aspirin EC 81 MG tablet Take 1 tablet (81 mg total) by mouth daily.  . Cholecalciferol (VITAMIN D3) 5000 UNITS CAPS Take 5,000 Units by mouth every Monday.   . clopidogrel (PLAVIX) 75 MG tablet Take 1 tablet (75 mg total) by mouth daily.  . fludrocortisone (FLORINEF) 0.1 MG tablet One daily to help supporrt BP  . guaiFENesin (MUCINEX) 600 MG 12 hr tablet Take 600 mg by mouth 2 (two) times daily.   Marland Kitchen levothyroxine (SYNTHROID, LEVOTHROID) 137 MCG tablet Take 1 tablet (137 mcg total) by mouth daily before breakfast.  . midodrine (PROAMATINE) 5 MG tablet Take 2 tablets (10 mg total) by mouth 3 (three) times daily with meals.  . pantoprazole (PROTONIX) 40 MG tablet Take 40 mg by mouth 2 (two) times daily.  . polyethylene glycol (MIRALAX / GLYCOLAX) packet Take 17 g by mouth daily as needed for mild constipation.   . pravastatin (PRAVACHOL) 40 MG tablet Take 40 mg by mouth at bedtime.  Marland Kitchen rOPINIRole (REQUIP) 0.5 MG tablet Take 1 tablet (0.5 mg total) by mouth 3 (three) times daily as needed (for restless leg syndrome).  Marland Kitchen senna (SENOKOT) 8.6 MG tablet Take 2 tablets by mouth daily as needed for constipation.   . tamsulosin (FLOMAX) 0.4 MG CAPS capsule TAKE ONE CAPSULE DAILY TO HELP IMPROVE URINARY FREQUENCY.  Marland Kitchen torsemide (DEMADEX) 10 MG tablet Take 10 mg by mouth daily as needed.  . traZODone (DESYREL) 50 MG tablet Take 50 mg by mouth at bedtime. May give an additional 25mg  1 hour later if unable to sleep as needed.   No facility-administered encounter medications on file as of 08/29/2016.     Review of Systems  Constitutional: Positive for fatigue. Negative for appetite change, chills, diaphoresis and fever.  HENT: Positive for hearing loss and trouble swallowing. Negative for congestion, mouth sores, nosebleeds, rhinorrhea and voice change.   Respiratory: Positive for cough and shortness of breath. Negative for  wheezing.        Mostly dry cough, dyspnea present  Cardiovascular: Positive for leg swelling. Negative for chest pain and palpitations.  Gastrointestinal: Positive for diarrhea. Negative for abdominal pain, nausea and vomiting.       Per pt had 2 bowel movement today, staff denies any documented bowel movement, he complaints of loose stool x 3 week and denies blood  Genitourinary: Negative for dysuria and flank pain.       Has indwelling foley catheter for urinary retention  Musculoskeletal: Positive for gait problem. Negative for back pain.  Skin: Negative for rash.  Neurological: Positive for dizziness and weakness. Negative for seizures, syncope, numbness and headaches.       Dizziness with position change, using walker with assistance to get around, improved strength compared to a week back  Psychiatric/Behavioral: Positive for confusion. Negative for behavioral problems.    Vitals:   08/29/16 1347  BP: 122/63  Pulse: 70  Resp: 13  Temp: 97.9 F (36.6 C)  SpO2: 95%  Weight: 182 lb 14.4 oz (83 kg)  Height: 5\' 10"  (1.778 m)   Body mass index is 26.24 kg/m. Physical Exam  Constitutional: He is oriented to person, place, and time. He appears well-developed. No distress.  HENT:  Head: Normocephalic and atraumatic.  Mouth/Throat: Oropharynx is clear and moist.  Missing his lower teeth  Eyes: Pupils are equal, round, and reactive to light. Conjunctivae are normal.  Neck: Normal range of motion. Neck supple. No JVD present. No thyromegaly present.  Cardiovascular: Normal rate and regular rhythm.   Murmur heard. Pulmonary/Chest: Effort normal and breath sounds normal. He has no wheezes. He has no rales.  Abdominal: Soft. Bowel sounds are normal. He exhibits no distension. There is no tenderness.  Genitourinary:  Genitourinary Comments: Foley catheter in place with clear urine  Musculoskeletal: He exhibits edema.  Can move all 4 extremities, weakness to his legs, arthritis  changes present, wide based gait and uses walker. 1+ pitting pedal edema  Lymphadenopathy:    He has no cervical adenopathy.  Neurological: He is alert and oriented to person, place, and time.  Slow with conversation and does get confused about incidences  Skin: Skin is warm and dry. No rash noted. He is not diaphoretic. No erythema.  Easy bruising, RLE open/ denuded skin area with mild erythema around it, no drainage  Psychiatric: He has a normal mood and affect.    Labs reviewed: Basic Metabolic Panel:  Recent Labs  03/10/16 1443 03/11/16 0436  05/08/16 08/01/16 0022 08/15/16  NA 140 139  < > 142 137 141  K 3.5 3.7  < > 4.1 4.1 3.7  CL 104 108  --   --  105  --   CO2 29 25  --   --  27  --   GLUCOSE 96 105*  --   --  104*  --   BUN 22* 18  < > 13 15 14   CREATININE 1.03 0.72  < > 0.8 1.00 0.9  CALCIUM 8.7* 8.0*  --   --  8.5*  --   < > = values in this interval not displayed. Liver Function Tests:  Recent Labs  01/10/16 1400 03/10/16 1443 03/11/16 0436 04/03/16 05/08/16 08/15/16  AST 25 53* 46* 15 20 22   ALT 22  26 23 14 19 21   ALKPHOS 68 61 51 60 78 62  BILITOT 0.4 0.6 0.4  --   --   --   PROT 6.4* 6.9 5.8*  --   --   --   ALBUMIN 3.5 3.7 3.1*  --   --   --    No results for input(s): LIPASE, AMYLASE in the last 8760 hours. No results for input(s): AMMONIA in the last 8760 hours. CBC:  Recent Labs  01/10/16 1400  03/10/16 1443  07/31/16 1359 08/01/16 0022 08/06/16 1641 08/15/16  WBC 6.0  < > 8.3  < > CANCELED 6.9 7.0 5.4  5.4  NEUTROABS 4.1  --  5.8  --  CANCELED  --   --   --   HGB 14.1  < > 13.1  < > CANCELED 12.9* 14.3 12.5*  12.5*  HCT 42.7  < > 39.5  < > CANCELED 38.3* 42.1 37*  37*  MCV 97.7  < > 95.6  < > CANCELED 94.3 94  --   PLT 190  < > 190  < > CANCELED 209 243 202  < > = values in this interval not displayed. Cardiac Enzymes:  Recent Labs  01/10/16 1904 01/10/16 2342 01/11/16 0537  TROPONINI <0.03 <0.03 <0.03   BNP: Invalid  input(s): POCBNP Lab Results  Component Value Date   HGBA1C 5.6 12/07/2015   Lab Results  Component Value Date   TSH 1.96 05/08/2016   No results found for: VITAMINB12 No results found for: FOLATE No results found for: IRON, TIBC, FERRITIN  Imaging and Procedures obtained prior to SNF admission: Ct Abdomen Pelvis W Contrast  Result Date: 08/01/2016 CLINICAL DATA:  83 y/o M; increasing lower abdominal pressure, chronic foley, abd distention. no nausea or vomiting. EXAM: CT ABDOMEN AND PELVIS WITH CONTRAST TECHNIQUE: Multidetector CT imaging of the abdomen and pelvis was performed using the standard protocol following bolus administration of intravenous contrast. CONTRAST:  100 cc Isovue-300 COMPARISON:  12/29/2014 CT of the abdomen and pelvis. FINDINGS: Lower chest: Large hiatal hernia containing the entirety of the stomach. Partially visualized pacemaker leads in right atrium and ventricle. Mitral valvular calcification. Hepatobiliary: No focal liver abnormality is seen. No gallstones, gallbladder wall thickening, or biliary dilatation. Pancreas: Unremarkable. No pancreatic ductal dilatation or surrounding inflammatory changes. Spleen: Normal in size without focal abnormality. Adrenals/Urinary Tract: Stable bilateral renal cysts the largest in the left lower pole measuring up to 4.9 cm. No urinary stone disease or hydronephrosis. The bladder is collapsed around the Foley catheter. Diffuse bladder wall thickening. Stomach/Bowel: No obstructive or inflammatory changes of the bowel. Large volume of stool throughout the colon. Vascular/Lymphatic: 14 mm penetrating ulcer directed posteriorly and inferiorly from the left lateral wall of infrarenal abdominal aorta (series 4, image 8). Severe calcific atherosclerosis of the abdominal aorta. Reproductive: Prostate is unremarkable. Other: No abdominal wall hernia or abnormality. No abdominopelvic ascites. Musculoskeletal: Advanced degenerative changes of the  visible thoracic and lumbar spine. No acute osseous abnormality is identified. Mild bilateral hip osteoarthrosis. IMPRESSION: 1. Bladder collapsed around the Foley catheter. Bladder wall thickening more than expected for underdistention, this may represent cystitis or be due to prior hypertrophy. 2. Large volume of stool in the colon, probably constipation. 3. Large hiatal hernia containing the entirety of stomach. 4. 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 Electronically Signed   By: Kristine Garbe M.D.   On: 08/01/2016 03:12   Chest X-Ray 2 Views 08/14/16 Impression. Cardiomegaly,  chronic and postoperative changes as discussed. No evidence of acute pulmonary pathology  Echocardiogram 08/20/16 Moderate AS, severe LV systolic dysfunction with abnormal relaxation, EF 35%, right lead pacemaker in place   Assessment/Plan  Diarrhea Unclear if he really has loose stool. Will need his Bowel movement to be documented. D/c senna s for now. Only on prn miralax, monitor, clinically appears hydrated. If diarrhea reported by nursing, will get stool for c.diff given him being on antibiotic recently.   Right leg wound Likely from his leg hitting against something. Provide skin care for now. Clean area with normal saline and apply triple antibiotic ointment for now. Monitor for signs of infection  Leg edema Pitting edema, BP improved, give torsemide 10 mg po x 1 now and monitor weight for now. Have him on torsemide 10 mg daily as needed for weight gain of > 3 lbs or increased leg edema. Keep legs elevated at rest.   CHF Poor EF and impaired systolic and diastolic function. Followed by cardiology. Not a candidate for right heart catheterization per there notes. Medical management. Limitation due to his orthostasis.   Unsteady gait High fall risk. Needs assistance with his ADLs. Spoke with his wife and daughter along with the patient. Pt [place] like to return to [place]. I would  recommend transfer to [place] given his need for assistance with ADLs, his medical co-morbidities for safe transfer. PT to assess further.   Cognitive impairment Check MMSE. Stable TSH. Supportive care.  Lab Results  Component Value Date   TSH 1.96 05/08/2016  .  SSS S/p pacemaker and this was recently interrogated by cardiology office. No arrhythmia reported.   Spent greater than 40 minutes this visit with greater than 50% time spent in assessing and reviewing care plan in detail with pt, his wife and daughter. Answered their questions.    Blanchie Serve, MD Internal Medicine [facility] [address] [place], [place] [address] Cell Phone (Monday-Friday 8 am - 5 pm): [phone] On Call: [phone] and follow prompts after 5 pm and on weekends Office Phone: [phone] Office Fax: [phone]

## 2016-08-30 ENCOUNTER — Encounter: Payer: Self-pay | Admitting: Family

## 2016-08-30 ENCOUNTER — Non-Acute Institutional Stay (SKILLED_NURSING_FACILITY): Payer: Medicare Other | Admitting: Family

## 2016-08-30 DIAGNOSIS — R2681 Unsteadiness on feet: Secondary | ICD-10-CM

## 2016-08-30 DIAGNOSIS — K5901 Slow transit constipation: Secondary | ICD-10-CM | POA: Diagnosis not present

## 2016-08-30 DIAGNOSIS — E782 Mixed hyperlipidemia: Secondary | ICD-10-CM

## 2016-08-30 DIAGNOSIS — I951 Orthostatic hypotension: Secondary | ICD-10-CM | POA: Diagnosis not present

## 2016-08-30 DIAGNOSIS — I2510 Atherosclerotic heart disease of native coronary artery without angina pectoris: Secondary | ICD-10-CM

## 2016-08-30 DIAGNOSIS — E039 Hypothyroidism, unspecified: Secondary | ICD-10-CM

## 2016-08-30 DIAGNOSIS — I5040 Unspecified combined systolic (congestive) and diastolic (congestive) heart failure: Secondary | ICD-10-CM | POA: Diagnosis not present

## 2016-08-30 DIAGNOSIS — R339 Retention of urine, unspecified: Secondary | ICD-10-CM

## 2016-08-30 NOTE — Progress Notes (Signed)
Location:  [facility] Room Number: 6 Place of Service:  SNF (31)  Provider: Marlowe Sax FNP-C   PCP: Blanchie Serve, MD Patient Care Team: Blanchie Serve, MD as PCP - General (Internal Medicine) Croitoru, Dani Gobble, MD as Attending Physician (Cardiology) Vevelyn Royals, MD as Consulting Physician (Ophthalmology) Franchot Gallo, MD as Consulting Physician (Urology) Tanda Rockers, MD as Consulting Physician (Pulmonary Disease) Allyn Kenner, MD (Dermatology) Deliah Goody, PA-C as Physician Assistant (Physician Assistant) Zackari Ruane, Nelda Bucks, NP as Nurse Practitioner (Family Medicine)  Extended Emergency Contact Information Primary Emergency Contact: Monteith,[name] Address: [address]          [place] Phone: [phone] Mobile Phone: [phone] Relation: Spouse Secondary Emergency Contact: [name],Corvera  [place] Phone: [phone] Mobile Phone: [phone] Relation: Daughter  Code Status: Full code  Goals of care:  Advanced Directive information Advanced Directives 08/30/2016  Does Patient Have a Medical Advance Directive? Yes  Type of Advance Directive [place]  Does patient want to make changes to medical advance directive? -  Copy of [place] in Chart? Yes  Would patient like information on creating a medical advance directive? -  Pre-existing out of facility DNR order (yellow form or pink MOST form) -     Allergies  Allergen Reactions  . Altace [Ramipril] Cough  . Crestor [Rosuvastatin Calcium] Rash  . Penicillins Rash and Other (See Comments)    Has patient had a PCN reaction causing immediate rash, facial/tongue/throat swelling, SOB or lightheadedness with hypotension: No Has patient had a PCN reaction causing severe rash involving mucus membranes or skin necrosis: Yes Has patient had a PCN reaction that required hospitalization Yes Has  patient had a PCN reaction occurring within the last 10 years: No If all of the above answers are "NO", then may proceed with Cephalosporin use.   . Sulfa Antibiotics Rash    Chief Complaint  Patient presents with  . Discharge Note    [place]    HPI:  83 y.o. male seen today at [facility] for discharge back to independent Living. He was here for short term rehabilitation due physical deconditioning and hypotension.He has a significant medical history of HTN, CHF, CAD,sick sinus syndrome,TIA,GERD, hypothyroidism,Urine retention,OA among other conditions.He is seen in his room today.He was seen 08/29/2016 by MD for diarrhea but states no loose stool today.  During his stay here in rehab he was treated for urinary tract infection. His blood pressure has been stable since Midodrine adjusted to 10 mg Tablet three times daily.He continues to require assistance with ADL's and has occasional dizziness.  He has worked well with PT/OT.He would like to be [place] back [place] though he can benefit from ALF due to occasional dizziness and need for assistance with ADL's.He has a Optician, dispensing to assist with ADL's and checking blood pressure.He does not require DME has own Walker and WC. Discharge process will be arranged by facility social worker prior to discharge.He will discharge with medication from the facility. He will  follow up with PCP in 1-2 weeks.   Past Medical History:  Diagnosis Date  . Arthritis    "minor, back and sometimes knees" (12/15/2012)  . Bradycardia    AFib/SSS s/p St Jude PPM 04/12/2008  . CAD (coronary artery disease) 12/30/2014   CABG (LIMA-LAD, SVG-RCA, SVG-OM in 1996).  07/2009 BMS to SVG-RCA. Cath in 04/2010 with patent  stents   . Cardiomyopathy, ischemic 08/25/2012  . CHF (congestive heart failure) ([place])   . Chronic knee pain 12/03/2014  . Combined congestive systolic and diastolic heart failure ([place]) 02/02/2015   Hx EF 41%. BNP 96.8  02/21/15 Torsemide 04/06/15 Na 142, K 4.6, Bun 16, creat 0.89 04/20/14 BNP 111.7, Na 142, K 4.6, Bun 16, creat 0.9   . Depression with anxiety 02/02/2015   02/21/15 Hgb A1c 5.8 03/10/15 MMSE 30/30   . Dizziness, after diuretic asscoiated with hypotension and responded to fluid bolus 06/05/2011   04/28/15 US carotid R+L normal bilateral arterial velocities.    Marland Kitchen Dyspnea 08/11/2014   Followed in Pulmonary clinic/ [facility]/ [place]  - 08/11/2014  Walked RA x 1 laps @ 185 ft each stopped due to fatigue/off balance/ slow pace/  no sob or desat  - PFT's  09/26/2014  FEV1 2.26 (85 % ) ratio 76  p no % improvement from saba with DLCO  67 % corrects to 93 % for alv volume      Since prev study 08/04/13 minimal change lung vol or dlco    . Embolic cerebral infarction ([place]) 12/06/2015  . Exertional shortness of breath    "sometimes walking" (12/15/2012)  . GERD (gastroesophageal reflux disease)   . Gout 02/09/2015  . Heart murmur    "just told I had one today" (12/15/2012)  . Hiatal hernia   . Hyperlipidemia   . Hypertension   . Hypothyroid   . Influenza A 03/10/2016  . Insomnia   . Melanoma of back ([place]) 1976  . Myocardial infarction [facility]) 1996; 2011   "both silent" (12/15/2012)  . Nonrheumatic aortic valve stenosis   . Orthostatic hypotension   . Osteoporosis, senile   . Pacemaker   . RBBB   . Restless leg 02/02/2015  . Right leg weakness 12/06/2015  . [facility] spotted fever   . S/P CABG x 4   . Sick sinus syndrome ([place]) 01/31/2014  . Sustained ventricular tachycardia ([place]) 07/27/2014  . Urinary retention 12/30/2014    Past Surgical History:  Procedure Laterality Date  . CARDIAC CATHETERIZATION  04/2010   LIMA to LAD patent,SVG to OM patent,no in-stnet restenosis RCA  . CATARACT EXTRACTION W/ INTRAOCULAR LENS  IMPLANT, BILATERAL Bilateral 2012  . CORONARY ANGIOPLASTY WITH STENT PLACEMENT  07/2009   bare metal stent to SVG to the RCA  . CORONARY ARTERY BYPASS GRAFT  1996   LIMA to LAD,SVG to  RCA & SVG to OM  . INSERT / REPLACE / REMOVE PACEMAKER  2010  . MELANOMA EXCISION  05/1974 X2   "taken off my back" (12/15/2012)  . NM MYOVIEW LTD  06/2011   low risk  . TONSILLECTOMY  1938  . TRANSURETHRAL RESECTION OF PROSTATE  1986  . US ECHOCARDIOGRAPHY  07/11/2009   EF 45-50%      reports that he has never smoked. He has never used smokeless tobacco. He reports that he does not drink alcohol or use drugs. Social History   Social History  . Marital status: Married    Spouse name: N/A  . Number of children: 2  . Years of education: Masters   Occupational History  . Retired Company secretary -Pensions consultant    Social History Main Topics  . Smoking status: Never Smoker  . Smokeless tobacco: Never Used  . Alcohol use No  . Drug use: No  . Sexual activity: No   Other Topics Concern  . Not on file  Social History Narrative   Lives at [place] to [place] 01/09/15   Married - Violet   Never smoked   Alcohol none   Previously employed as Scientist, forensic for [employer].         Diet:Low sodium   Do you drink/eat things with caffeine? No   Marital status: Married                              What year were you married?1950   Do you live in a house, apartment, assisted living, condo, trailer, etc)?    Is it one or more stories? 1   How many persons live in your home? 2   Do you have any pets in your home? No   Current or past profession: Minister, [employer]   Do you exercise?     Very Little                                                 Type & how often:    Do you have a living will?  Yes   Do you have a DNR Form? Yes   Do you have a POA/HPOA forms? Yes    Allergies  Allergen Reactions  . Altace [Ramipril] Cough  . Crestor [Rosuvastatin Calcium] Rash  . Penicillins Rash and Other (See Comments)    Has patient had a PCN reaction causing immediate rash, facial/tongue/throat swelling, SOB or lightheadedness with hypotension: No Has patient  had a PCN reaction causing severe rash involving mucus membranes or skin necrosis: Yes Has patient had a PCN reaction that required hospitalization Yes Has patient had a PCN reaction occurring within the last 10 years: No If all of the above answers are "NO", then may proceed with Cephalosporin use.   . Sulfa Antibiotics Rash    Pertinent  Health Maintenance Due  Topic Date Due  . PNA vac Low Risk Adult (2 of 2 - PCV13) 02/04/2017 (Originally 10/06/2010)  . INFLUENZA VACCINE  09/04/2016  . DEXA SCAN  12/14/2024    Medications: Allergies as of 08/30/2016      Reactions   Altace [ramipril] Cough   Crestor [rosuvastatin Calcium] Rash   Penicillins Rash, Other (See Comments)   Has patient had a PCN reaction causing immediate rash, facial/tongue/throat swelling, SOB or lightheadedness with hypotension: No Has patient had a PCN reaction causing severe rash involving mucus membranes or skin necrosis: Yes Has patient had a PCN reaction that required hospitalization Yes Has patient had a PCN reaction occurring within the last 10 years: No If all of the above answers are "NO", then may proceed with Cephalosporin use.   Sulfa Antibiotics Rash      Medication List       Accurate as of 08/30/16  2:17 PM. Always use your most recent med list.          acetaminophen 325 MG tablet Commonly known as:  TYLENOL Take 650 mg by mouth every 8 (eight) hours as needed.   amiodarone 200 MG tablet Commonly known as:  PACERONE Take 400 mg by mouth daily.   aspirin EC 81 MG tablet Take 1 tablet (81 mg total) by mouth daily.   clopidogrel 75 MG tablet Commonly known as:  PLAVIX Take 1 tablet (75 mg total)  by mouth daily.   fludrocortisone 0.1 MG tablet Commonly known as:  FLORINEF One daily to help supporrt BP   guaiFENesin 600 MG 12 hr tablet Commonly known as:  MUCINEX Take 600 mg by mouth 2 (two) times daily.   levothyroxine 137 MCG tablet Commonly known as:  SYNTHROID,  LEVOTHROID Take 1 tablet (137 mcg total) by mouth daily before breakfast.   midodrine 5 MG tablet Commonly known as:  PROAMATINE Take 2 tablets (10 mg total) by mouth 3 (three) times daily with meals.   pantoprazole 40 MG tablet Commonly known as:  PROTONIX Take 40 mg by mouth 2 (two) times daily.   polyethylene glycol packet Commonly known as:  MIRALAX / GLYCOLAX Take 17 g by mouth daily as needed for mild constipation.   pravastatin 40 MG tablet Commonly known as:  PRAVACHOL Take 40 mg by mouth at bedtime.   rOPINIRole 0.5 MG tablet Commonly known as:  REQUIP Take 1 tablet (0.5 mg total) by mouth 3 (three) times daily as needed (for restless leg syndrome).   senna 8.6 MG tablet Commonly known as:  SENOKOT Take 2 tablets by mouth daily as needed for constipation.   tamsulosin 0.4 MG Caps capsule Commonly known as:  FLOMAX TAKE ONE CAPSULE DAILY TO HELP IMPROVE URINARY FREQUENCY.   torsemide 10 MG tablet Commonly known as:  DEMADEX Take 10 mg by mouth daily as needed.   traZODone 50 MG tablet Commonly known as:  DESYREL Take 50 mg by mouth at bedtime. May give an additional 25mg  1 hour later if unable to sleep as needed.   Vitamin D3 5000 units Caps Take 5,000 Units by mouth every Monday.       Review of Systems  Constitutional: Negative for activity change, appetite change, chills, fatigue and fever.  HENT: Negative for congestion, rhinorrhea, sinus pain, sinus pressure, sneezing and sore throat.   Eyes: Negative for pain, discharge, redness and itching.  Respiratory: Negative for cough, chest tightness, shortness of breath and wheezing.   Cardiovascular: Positive for leg swelling. Negative for chest pain and palpitations.  Gastrointestinal: Negative for abdominal distention, abdominal pain, constipation, diarrhea, nausea and vomiting.  Endocrine: Negative for cold intolerance, heat intolerance, polydipsia, polyphagia and polyuria.  Genitourinary: Negative for  flank pain and urgency.       Indwelling foley catheter   Musculoskeletal: Positive for gait problem.  Skin: Negative for color change, pallor and rash.  Neurological: Negative for dizziness, syncope, light-headedness and headaches.  Hematological: Does not bruise/bleed easily.  Psychiatric/Behavioral: Negative for agitation, confusion, hallucinations and sleep disturbance. The patient is not nervous/anxious.     Vitals:   08/30/16 1225  BP: 122/63  Pulse: 70  Resp: 13  Temp: 97.9 F (36.6 C)  SpO2: 95%  Weight: 182 lb 14.4 oz (83 kg)  Height: 5\' 10"  (1.778 m)   Body mass index is 26.24 kg/m. Physical Exam  Constitutional: He is oriented to person, place, and time. He appears well-developed and well-nourished. No distress.  HENT:  Head: Normocephalic.  Mouth/Throat: Oropharynx is clear and moist. No oropharyngeal exudate.  Eyes: Pupils are equal, round, and reactive to light. Conjunctivae and EOM are normal. Right eye exhibits no discharge. Left eye exhibits no discharge. No scleral icterus.  Neck: Normal range of motion. No JVD present. No thyromegaly present.  Cardiovascular: Normal rate, regular rhythm, normal heart sounds and intact distal pulses.  Exam reveals no gallop and no friction rub.   No murmur heard. Pulmonary/Chest: Effort normal  and breath sounds normal. No respiratory distress. He has no wheezes. He has no rales.  Abdominal: Soft. Bowel sounds are normal. He exhibits no distension. There is no tenderness. There is no rebound and no guarding.  Musculoskeletal: He exhibits no tenderness or deformity.  Unsteady gait uses walker with seat.also has wheelchair. lower extremities Ted hose in place.   Lymphadenopathy:    He has no cervical adenopathy.  Neurological: He is oriented to person, place, and time.  Skin: Skin is warm and dry. No rash noted. No erythema.  Psychiatric: He has a normal mood and affect.    Labs reviewed: Basic Metabolic Panel:  Recent  Labs  03/10/16 1443 03/11/16 0436  05/08/16 08/01/16 0022 08/15/16  NA 140 139  < > 142 137 141  K 3.5 3.7  < > 4.1 4.1 3.7  CL 104 108  --   --  105  --   CO2 29 25  --   --  27  --   GLUCOSE 96 105*  --   --  104*  --   BUN 22* 18  < > 13 15 14   CREATININE 1.03 0.72  < > 0.8 1.00 0.9  CALCIUM 8.7* 8.0*  --   --  8.5*  --   < > = values in this interval not displayed. Liver Function Tests:  Recent Labs  01/10/16 1400 03/10/16 1443 03/11/16 0436 04/03/16 05/08/16 08/15/16  AST 25 53* 46* 15 20 22   ALT 22 26 23 14 19 21   ALKPHOS 68 61 51 60 78 62  BILITOT 0.4 0.6 0.4  --   --   --   PROT 6.4* 6.9 5.8*  --   --   --   ALBUMIN 3.5 3.7 3.1*  --   --   --    CBC:  Recent Labs  01/10/16 1400  03/10/16 1443  07/31/16 1359 08/01/16 0022 08/06/16 1641 08/15/16  WBC 6.0  < > 8.3  < > CANCELED 6.9 7.0 5.4  5.4  NEUTROABS 4.1  --  5.8  --  CANCELED  --   --   --   HGB 14.1  < > 13.1  < > CANCELED 12.9* 14.3 12.5*  12.5*  HCT 42.7  < > 39.5  < > CANCELED 38.3* 42.1 37*  37*  MCV 97.7  < > 95.6  < > CANCELED 94.3 94  --   PLT 190  < > 190  < > CANCELED 209 243 202  < > = values in this interval not displayed. Cardiac Enzymes:  Recent Labs  01/10/16 1904 01/10/16 2342 01/11/16 0537  TROPONINI <0.03 <0.03 <0.03   Assessment/Plan:     1. Combined systolic and diastolic congestive heart failure ECHO EF 35 % with severe LV global dysfunction.Not a surgical candidate per cardiology.continues to have dyspnea with exertion.continue on torsemide 10 mg tablet daily as needed. Continue to monitor weight. Follow up with cardiology as directed.    2. Orthostatic hypotension B/p has improved. Continue on midodrine 10 mg tablet three times daily. Monitor B/P and notify provider if B/p < 90/60.   3. Coronary artery disease Chest pain free. Continue on ASA and Plavix. On Pravastatin 40 mg Tablet   4. Slow transit constipation Recent loose stool. None reported this visit. Awaiting  stool results for C-diff. Has Miralax as needed.    5. Hypothyroidism Continue on Levothyroxine 137 mcg tablet daily. Monitor TSH level.   6. Mixed hyperlipidemia Continue on  Pravastatin 40 mg Tablet.check Lipid panel periodically.   7. Urinary retention  Has chronic indwelling Foley catheter. Recently complete ABXs for UTI. Continue to follow up with Urology for foley catheter management.   8. Abnormal Gait   Has worked with PT/ OT. [place] discharge back to [place]. No DME required has own walker and WC.Fall and safety precautions discussed.   Patient is being discharged with the following home health services:   - Has personal assistant care  Patient is being discharged with the following durable medical equipment:   - None required has own walker and WC.  Patient has been advised to f/u with their PCP in 1-2 weeks to for a transitions of care visit.Social services at their facility was responsible for arranging this appointment.   Future labs/tests needed:  CBC, BMP in 1-2 weeks PCP

## 2016-09-04 ENCOUNTER — Ambulatory Visit (INDEPENDENT_AMBULATORY_CARE_PROVIDER_SITE_OTHER): Payer: Medicare Other | Admitting: Neurology

## 2016-09-04 ENCOUNTER — Encounter: Payer: Self-pay | Admitting: Neurology

## 2016-09-04 VITALS — BP 124/82 | HR 69

## 2016-09-04 DIAGNOSIS — R269 Unspecified abnormalities of gait and mobility: Secondary | ICD-10-CM

## 2016-09-04 DIAGNOSIS — M6281 Muscle weakness (generalized): Secondary | ICD-10-CM

## 2016-09-04 DIAGNOSIS — R5381 Other malaise: Secondary | ICD-10-CM | POA: Diagnosis not present

## 2016-09-04 DIAGNOSIS — R42 Dizziness and giddiness: Secondary | ICD-10-CM

## 2016-09-04 NOTE — Progress Notes (Signed)
Subjective:    Patient ID: Adam [name] is a 83 y.o. male.  HPI     Interim history:   Adam [name] is a 83 year old right-handed gentleman with an underlying complex medical history of hypothyroidism, melanoma in 1976, heart disease, status post CABG in 1996, 4 vessels, and pacemaker placement secondary to bradycardia in 2010, hypertension, hyperlipidemia, reflux d/s, insomnia, overweight state and restless leg symptoms, who presents for follow-up consultation of his dizziness and gait imbalance disorder. The patient is accompanied by today. I last saw him on 04/30/2016, at which time we talked about his recent multiple hospitalizations, he had been hospitalized due to weakness on the right side, acute stroke workup was negative but MRI was not possible secondary to pacemaker in place. He was suspected to have A. fib but since this was not confirmed he was taken off of Eliquis and maintained on Plavix. He was on midodrine and Florinef for orthostatic hypotension. He had some memory loss, felt dizzy intermittently. He was also hospitalized in February 2018 for suspected flu.  Today, 09/04/2016: He reports recent UTIs, per wife, about 3 times. Currently, not on Abx. In independent living, recently D/C from SNF, they have a CNA daily for about 4 hours daily. He has had an intermittent tremor. Somebody had mentioned questionable parkinsonism according to the daughter. He has a chronic cough, he takes Mucinex every day twice daily and also multiple cough drops per day. He had seen Dr. Melvyn Novas about 2 years ago for chronic cough. He has had worsening lower extremity swelling. He was on standing Requip 3 times a day and now as needed. He feels that his restless leg symptoms have flared up and it is difficult for him to sleep at night. It is harder for him to get comfortable. His lower extremity swelling has increased. There have been intermittent episodes of confusion as I understand at primary care has requested  he have a MMSE for today's visit. He was recently seen in cardiology for follow-up. I reviewed the note. He had a swallow study. He was suspected to have silent aspirations. He is supposed to have speech therapy for this as I understand.   The patient's allergies, current medications, family history, past medical history, past social history, past surgical history and problem list were reviewed and updated as appropriate.    Previously (copied from previous notes for reference):   I saw him on 10/30/2015, at which time he reported doing okay, some swelling both legs, from knees down, was using compression stockings. We talked about gait safety at the time and he was advised to use his walker at all times.     I saw him on 02/22/2015, at which time he reported doing fairly well. He was in assisted living. He was at [facility] for about a week for rehabilitation. He had adjusted fairly well. He reported no recent falls but had a near fall according to his wife when he started a new bladder medication, this was stopped after 2 days. He was using his 2 wheeled walker. Trazodone was 25 mg each night, insomnia was still a problem however. He was on Requip 0.5 mg each night and did not start gabapentin. His constipation was better. Of note, he presented to the ED with severe constipation, but had a BM there and improved. I reviewed the ED notes from 12/06/14. He again presented to the emergency room on 12/29/2014 with a one-week history of constipation. This required disimpaction in the emergency department. He  was admitted to the hospital secondary to urinary retention and discharged on 01/01/2015. I reviewed the hospital records including discharge summary. I suggested he taper of trazodone completely. I suggested he try melatonin at night for sleep. I asked him to use his walker at all times and change positions slowly.   He missed his appointment on 02/21/15. I first met him on 11/22/2014 at the request of  his primary care physician, at which time the patient reported a several year history of intermittent dizziness, worse within the previous 4 months. He also reported dream enactments. I suggested he reduce his trazodone and taper of ropinirole. He reported symptoms of restless leg syndrome. I suggested he try low-dose gabapentin.   11/22/2014: He reports problems with dizziness and his balance. This has been ongoing for years, worse in the last 4 months. He has difficulty sleeping. He has restless leg symptoms. He has been on ropinirole low dose for the past 6 months or so. This was recently increased to 0.5 mg strength 1-2 pills at night. He has been taking 1-1/2 pills for the past couple of weeks. He has been on trazodone for the past month. He has a fever year history of dream activity including yelling out in his sleep and he has even rolled out of bed and hit his head against the night table before. Trazodone helps him sleep but his wife feels that his yelling out episodes have become a little bit more frequent since he has been on trazodone. This is 50 mg each night and he has been on it for a month or so. He is not sure if he has had his vitamin B12 level and vitamin D levels checked recently. He is due for a TSH next month through your office. I reviewed your office note from 11/11/2014 which you kindly included. He sees Dr. Melvyn Novas in pulmonology and had a recent CT sinuses which was negative for any acute sinusitis. He sees cardiology, Dr. Marland KitchenC". He had a head CT without contrast on 12/10/2013 which showed atrophy and small vessel disease. I personally reviewed the images through the PACS system and also shared some pictures with the patient and his family. He had no significant white matter changes but atrophy may have been a little bit more advanced than expected for age in my opinion. He reports a sensation of lightheadedness and insecurity when standing up. He feels like he might fall and has had some  near falls but thankfully no actual falls. He denies any spinning sensation but sometimes he feels like something is spinning in his head. He denies any headaches. He does not snore or have any significant issues with breathing at night. In the past he has tried melatonin for sleep which was marginally helpful. He has had occasional tingling in his fingertips. He is not diabetic. He does not always drink enough water, his family feels. Of note, he has been on amiodarone. He has had lower extremity swelling for the past several years, essentially since his open heart surgery but in the past 6 months his swelling has become significantly worse per wife.   His Past Medical History Is Significant For: Past Medical History:  Diagnosis Date  . Arthritis    "minor, back and sometimes knees" (12/15/2012)  . Bradycardia    AFib/SSS s/p St Jude PPM 04/12/2008  . CAD (coronary artery disease) 12/30/2014   CABG (LIMA-LAD, SVG-RCA, SVG-OM in 1996).  07/2009 BMS to SVG-RCA. Cath in 04/2010 with patent stents   .  Cardiomyopathy, ischemic 08/25/2012  . CHF (congestive heart failure) ([place])   . Chronic knee pain 12/03/2014  . Combined congestive systolic and diastolic heart failure ([place]) 02/02/2015   Hx EF 41%. BNP 96.8 02/21/15 Torsemide 04/06/15 Na 142, K 4.6, Bun 16, creat 0.89 04/20/14 BNP 111.7, Na 142, K 4.6, Bun 16, creat 0.9   . Depression with anxiety 02/02/2015   02/21/15 Hgb A1c 5.8 03/10/15 MMSE 30/30   . Dizziness, after diuretic asscoiated with hypotension and responded to fluid bolus 06/05/2011   04/28/15 US carotid R+L normal bilateral arterial velocities.    Marland Kitchen Dyspnea 08/11/2014   Followed in Pulmonary clinic/ [facility]/ [place]  - 08/11/2014  Walked RA x 1 laps @ 185 ft each stopped due to fatigue/off balance/ slow pace/  no sob or desat  - PFT's  09/26/2014  FEV1 2.26 (85 % ) ratio 76  p no % improvement from saba with DLCO  67 % corrects to 93 % for alv volume      Since prev study 08/04/13 minimal change  lung vol or dlco    . Embolic cerebral infarction ([place]) 12/06/2015  . Exertional shortness of breath    "sometimes walking" (12/15/2012)  . GERD (gastroesophageal reflux disease)   . Gout 02/09/2015  . Heart murmur    "just told I had one today" (12/15/2012)  . Hiatal hernia   . Hyperlipidemia   . Hypertension   . Hypothyroid   . Influenza A 03/10/2016  . Insomnia   . Melanoma of back ([place]) 1976  . Myocardial infarction [facility]) 1996; 2011   "both silent" (12/15/2012)  . Nonrheumatic aortic valve stenosis   . Orthostatic hypotension   . Osteoporosis, senile   . Pacemaker   . RBBB   . Restless leg 02/02/2015  . Right leg weakness 12/06/2015  . [facility] spotted fever   . S/P CABG x 4   . Sick sinus syndrome ([place]) 01/31/2014  . Sustained ventricular tachycardia ([place]) 07/27/2014  . Urinary retention 12/30/2014    His Past Surgical History Is Significant For: Past Surgical History:  Procedure Laterality Date  . CARDIAC CATHETERIZATION  04/2010   LIMA to LAD patent,SVG to OM patent,no in-stnet restenosis RCA  . CATARACT EXTRACTION W/ INTRAOCULAR LENS  IMPLANT, BILATERAL Bilateral 2012  . CORONARY ANGIOPLASTY WITH STENT PLACEMENT  07/2009   bare metal stent to SVG to the RCA  . CORONARY ARTERY BYPASS GRAFT  1996   LIMA to LAD,SVG to RCA & SVG to OM  . INSERT / REPLACE / REMOVE PACEMAKER  2010  . MELANOMA EXCISION  05/1974 X2   "taken off my back" (12/15/2012)  . NM MYOVIEW LTD  06/2011   low risk  . TONSILLECTOMY  1938  . TRANSURETHRAL RESECTION OF PROSTATE  1986  . US ECHOCARDIOGRAPHY  07/11/2009   EF 45-50%    His Family History Is Significant For: Family History  Problem Relation Age of Onset  . Coronary artery disease Mother   . Diabetes Mother   . Heart disease Mother   . Coronary artery disease Father   . Diabetes Father   . Lung cancer Father   . Heart disease Father   . Anuerysm Son   . Heart disease Brother     His Social History Is Significant For: Social  History   Social History  . Marital status: Married    Spouse name: N/A  . Number of children: 2  . Years of education: Masters  Occupational History  . Retired Company secretary -Pensions consultant    Social History Main Topics  . Smoking status: Never Smoker  . Smokeless tobacco: Never Used  . Alcohol use No  . Drug use: No  . Sexual activity: No   Other Topics Concern  . None   Social History Narrative   Lives at [place] to [place] 01/09/15   Married - Violet   Never smoked   Alcohol none   Previously employed as Scientist, forensic for [employer].         Diet:Low sodium   Do you drink/eat things with caffeine? No   Marital status: Married                              What year were you married?1950   Do you live in a house, apartment, assisted living, condo, trailer, etc)?    Is it one or more stories? 1   How many persons live in your home? 2   Do you have any pets in your home? No   Current or past profession: Minister, [employer]   Do you exercise?     Very Little                                                 Type & how often:    Do you have a living will?  Yes   Do you have a DNR Form? Yes   Do you have a POA/HPOA forms? Yes    His Allergies Are:  Allergies  Allergen Reactions  . Altace [Ramipril] Cough  . Crestor [Rosuvastatin Calcium] Rash  . Penicillins Rash and Other (See Comments)    Has patient had a PCN reaction causing immediate rash, facial/tongue/throat swelling, SOB or lightheadedness with hypotension: No Has patient had a PCN reaction causing severe rash involving mucus membranes or skin necrosis: Yes Has patient had a PCN reaction that required hospitalization Yes Has patient had a PCN reaction occurring within the last 10 years: No If all of the above answers are "NO", then may proceed with Cephalosporin use.   . Sulfa Antibiotics Rash  :   His Current Medications Are:  Outpatient Encounter Prescriptions as of  09/04/2016  Medication Sig  . acetaminophen (TYLENOL) 325 MG tablet Take 650 mg by mouth every 8 (eight) hours as needed.   Marland Kitchen amiodarone (PACERONE) 200 MG tablet Take 400 mg by mouth daily.  Marland Kitchen aspirin EC 81 MG tablet Take 1 tablet (81 mg total) by mouth daily.  . Cholecalciferol (VITAMIN D3) 5000 UNITS CAPS Take 5,000 Units by mouth every Monday.   . clopidogrel (PLAVIX) 75 MG tablet Take 1 tablet (75 mg total) by mouth daily.  . fludrocortisone (FLORINEF) 0.1 MG tablet One daily to help supporrt BP  . guaiFENesin (MUCINEX) 600 MG 12 hr tablet Take 600 mg by mouth daily.   Marland Kitchen levothyroxine (SYNTHROID, LEVOTHROID) 137 MCG tablet Take 1 tablet (137 mcg total) by mouth daily before breakfast.  . midodrine (PROAMATINE) 5 MG tablet Take 2 tablets (10 mg total) by mouth 3 (three) times daily with meals.  . pantoprazole (PROTONIX) 40 MG tablet Take 40 mg by mouth 2 (two) times daily.  . polyethylene glycol (MIRALAX / GLYCOLAX) packet Take 17 g by mouth daily as  needed for mild constipation.   . pravastatin (PRAVACHOL) 40 MG tablet Take 40 mg by mouth at bedtime.  Marland Kitchen rOPINIRole (REQUIP) 0.5 MG tablet Take 1 tablet (0.5 mg total) by mouth 3 (three) times daily as needed (for restless leg syndrome).  Marland Kitchen senna (SENOKOT) 8.6 MG tablet Take 2 tablets by mouth daily as needed for constipation.   . tamsulosin (FLOMAX) 0.4 MG CAPS capsule TAKE ONE CAPSULE DAILY TO HELP IMPROVE URINARY FREQUENCY.  Marland Kitchen torsemide (DEMADEX) 10 MG tablet Take 10 mg by mouth daily as needed.  . traZODone (DESYREL) 50 MG tablet Take 50 mg by mouth at bedtime. May give an additional 42m 1 hour later if unable to sleep as needed.   No facility-administered encounter medications on file as of 09/04/2016.   :  Review of Systems:  Out of a complete 14 point review of systems, all are reviewed and negative with the exception of these symptoms as listed below:  Review of Systems  Neurological:       Pt presents today to follow up. [facility] says things are going well and he denies any new complaints today.    Objective:  Neurological Exam  Physical Exam Physical Examination:   Vitals:   09/04/16 1507  BP: 124/82  Pulse: 69   General Examination: The patient is a very pleasant 83y.o. male in no acute distress. He appears frail, well groomed, situated in his wheelchair.   HEENT: Normocephalic, atraumatic, pupils are equal, round and reactive to light and accommodation. He has no significant facial masking, no lip, neck or jaw tremor. Extraocular tracking is fairly well-preserved, eye blink rate is fairly well preserved. Hearing seems to be impaired. No dysarthria, mild hypophonia noted. Airway examination reveals mild mouth dryness. Tongue protrudes centrally and palate elevates symmetrically, no facial weakness is noted.   Chest: Bibasilar crackles, no wheezing.   Heart: S1+S2+0, regular with mild systolic murmur noted.   Abdomen: Soft, non-tender and non-distended with normal bowel sounds appreciated on auscultation.  Extremities: There is 1 to 2+ pitting edema in the distal lower extremities, R more than L.   Skin: Warm and dry but bruising noted throughout both arms and hands.   Musculoskeletal: exam reveals no obvious joint deformities, tenderness or joint swelling or erythema.   Neurologically:  Mental status: The patient is awake, alert and oriented, his memory, attention, language and fund of knowledge are fairly appropriate. He is somewhat slow in responding at times.   On 09/04/2016: MMSE: 26/30, AFT: 9/min.   He is providing part of his history, his daughter provides details and also his wife supplements. He has no dysarthria, mild hypophonia is noted. Mildly raspy voice.   Cranial nerves II - XII are as described above under HEENT exam.  Motor exam:  Thin bulk, global strength of 4 out of 5, reflexes 1+ throughout. Fine motor skills are globally mildly to moderately  impaired. he has no resting tremor, he has intermittent bobbing of both lower extremities and also intermittent deliberate appearing movements of the left upper extremity, a variable appearing tremor in the left upper extremity. He has no cogwheeling or increase in tone in the upper or lower extremities. He is situated in his wheelchair, Romberg testing and gait testing has not possible for me, he does not have a walker. Sensory exam: intact to light touch.   Assessment and Plan:  In summary, BKASPAR ALBORNOZis a very pleasant 83year old  male with an underlying complex medical history of hypothyroidism, melanoma in 1976, heart disease, status post CABG in 1996, s/p PM placement secondary to bradycardia in 2010, hypertension,orthostatic hypotension,hyperlipidemia, reflux disease, insomnia, overweight state, RLS and recent hospitalization for weakness, concerning for TIA, then again more recent hospitalization in February 2018 for suspected flu, and Hx of recurrent UTIs, who presents for follow-up consultation of his dizziness and balance problems. Most likely, this is a multifactorial problem for which I have followed him over time. I think from a neurological standpoint he is stable to follow-up with primary care. A couple of concerns I addressed with them today include concern for parkinsonism and I explained to him that he does not have any signs of parkinsonism. Intermittent tremor can be caused by multiple factors including stress, anxiety, certain medications. I noticed by basilar crackles today which could be secondary to his heart disease and his swelling in his lower extremities is also worse. He was recently seen by his cardiologist and is encouraged to talk to his PCP about this. Furthermore, for chronic cough he has been on Mucinex twice daily as a standing medication. He also takes cough drops multiple times per day, averaging about 3 or 4 cough drops per day and while they immediately may help his  coughing spell, I think ultimately he is perhaps even perpetuating chronic cough with taking cough drops but I would like for him to consider seeing his lung doctor again. He is also encouraged to discuss this with his primary care provider. His memory scores are mildly abnormal but explained perhaps by his recent recurrent UTIs and also vascular risk factors and also explained by his advancing age. Overall, I personally do not recommend he be treated with a dementia medication, particularly for fear of side effects in his case.  His situation has with time become more complicated, what with his recent weakness and acute illnesses and recurrent UTIs. I think supportive care and ongoing symptomatic management of his different medical problems will continue to be key. I would avoid Requip d/t swelling and potentially causing other side effects including daytime somnolence, hallucinations. He is advised to try melatonin for sleep at night. He has a prescription for trazodone at night as well. At this juncture, from the neurological standpoint, I do not have any other additional recommendations. I did ask them to try to make sure he has good nutrition and good hydration, strengthening exercises, fall risk management.   I do not need to see him on a routine basis and suggested as needed FU. I answered all their questions today and the patient and his wife and daughter were in agreement. I spent 30 minutes in total face-to-face time with the patient, more than 50% of which was spent in counseling and coordination of care, reviewing test results, reviewing medication and discussing or reviewing the diagnosis of gait d/o, its prognosis and treatment options. Pertinent laboratory and imaging test results that were available during this visit with the patient were reviewed by me and considered in my medical decision making (see chart for details).

## 2016-09-04 NOTE — Patient Instructions (Addendum)
Neurological exam is stable.  Please consider seeing Dr. Melvyn Novas for chronic cough, I would avoid daily cough drops and work with speech therapy for concern for silent aspiration.  Please check with Dr. Melvyn Novas, whether you should be on daily Mucinex on a standing basis.  If you have had an MRI brain in the past year, I would like to see the report. Please call us back in that regard.  I do not detect any signs of parkinsonism.  I would avoid Requip, as it can cause swelling.  Consider Melatonin at night for sleep: take 1 mg to 3 mg, one to 2 hours before your bedtime. You can go up to 5 mg if needed. It is over the counter and comes in pill form, chewable form and spray, if you prefer.

## 2016-09-07 ENCOUNTER — Other Ambulatory Visit: Payer: Self-pay | Admitting: Internal Medicine

## 2016-09-09 ENCOUNTER — Non-Acute Institutional Stay: Payer: Medicare Other | Admitting: Internal Medicine

## 2016-09-09 ENCOUNTER — Encounter: Payer: Self-pay | Admitting: Internal Medicine

## 2016-09-09 VITALS — BP 130/62 | HR 70 | Temp 97.4°F | Resp 16 | Ht 70.0 in | Wt 186.2 lb

## 2016-09-09 DIAGNOSIS — I5042 Chronic combined systolic (congestive) and diastolic (congestive) heart failure: Secondary | ICD-10-CM

## 2016-09-09 DIAGNOSIS — G2581 Restless legs syndrome: Secondary | ICD-10-CM | POA: Diagnosis not present

## 2016-09-09 DIAGNOSIS — R197 Diarrhea, unspecified: Secondary | ICD-10-CM

## 2016-09-09 DIAGNOSIS — R600 Localized edema: Secondary | ICD-10-CM | POA: Diagnosis not present

## 2016-09-09 DIAGNOSIS — R42 Dizziness and giddiness: Secondary | ICD-10-CM

## 2016-09-09 MED ORDER — LOPERAMIDE HCL 2 MG PO TABS: 2.0000 mg | ORAL_TABLET | Freq: Three times a day (TID) | ORAL | 0 refills | Status: DC | PRN

## 2016-09-09 NOTE — Progress Notes (Signed)
Provider:  Blanchie Serve MD  Location:  [facility] of Service:  Clinic (12)  PCP: Blanchie Serve, MD Patient Care Team: Blanchie Serve, MD as PCP - General (Internal Medicine) Croitoru, Dani Gobble, MD as Attending Physician (Cardiology) Vevelyn Royals, MD as Consulting Physician (Ophthalmology) Franchot Gallo, MD as Consulting Physician (Urology) Tanda Rockers, MD as Consulting Physician (Pulmonary Disease) Allyn Kenner, MD (Dermatology) Deliah Goody, PA-C as Physician Assistant (Physician Assistant) Ngetich, Nelda Bucks, NP as Nurse Practitioner (Family Medicine)  Extended Emergency Contact Information Primary Emergency Contact: Muraoka,[name] Address: [address]          [place] Phone: [phone] Mobile Phone: [phone] Relation: Spouse Secondary Emergency Contact: [name],Mcgruder  [place] Phone: [phone] Mobile Phone: [phone] Relation: Daughter  Code Status: Full Code  Goals of Care: Advanced Directive information Advanced Directives 08/30/2016  Does Patient Have a Medical Advance Directive? Yes  Type of Advance Directive [place]  Does patient want to make changes to medical advance directive? -  Copy of [place] in Chart? Yes  Would patient like information on creating a medical advance directive? -  Pre-existing out of facility DNR order (yellow form or pink MOST form) -      Chief Complaint  Patient presents with  . Acute Visit    loose and dark stool, dizziness, leg swelling    HPI: Patient is a 83 y.o. male seen today for acute visit.   Loose stool- per pt ongoing for 4 weeks. His stomach has been growling and churning. Currently on senna and miralax but has not taken it for a week. Has noticed black colored stool but denies any frank blood in stool. Denies any abdominal pain, nausea or vomiting. No fever or chills. Appetite  fair.   Dizziness- persists. Improved some but it is still bothersome. On midodrine at present with florinef  Leg edema- has increased. Wife has not given torsemide  RLS- persists and bothers him. On prn requip  CHF- EF 35% on review of echocardiogram result with severe LV global dysfunction. Has dyspnea with exertion. Leg swelling increased.     Past Medical History:  Diagnosis Date  . Arthritis    "minor, back and sometimes knees" (12/15/2012)  . Bradycardia    AFib/SSS s/p St Jude PPM 04/12/2008  . CAD (coronary artery disease) 12/30/2014   CABG (LIMA-LAD, SVG-RCA, SVG-OM in 1996).  07/2009 BMS to SVG-RCA. Cath in 04/2010 with patent stents   . Cardiomyopathy, ischemic 08/25/2012  . CHF (congestive heart failure) ([place])   . Chronic knee pain 12/03/2014  . Combined congestive systolic and diastolic heart failure ([place]) 02/02/2015   Hx EF 41%. BNP 96.8 02/21/15 Torsemide 04/06/15 Na 142, K 4.6, Bun 16, creat 0.89 04/20/14 BNP 111.7, Na 142, K 4.6, Bun 16, creat 0.9   . Depression with anxiety 02/02/2015   02/21/15 Hgb A1c 5.8 03/10/15 MMSE 30/30   . Dizziness, after diuretic asscoiated with hypotension and responded to fluid bolus 06/05/2011   04/28/15 US carotid R+L normal bilateral arterial velocities.    Marland Kitchen Dyspnea 08/11/2014   Followed in Pulmonary clinic/ [facility]/ [place]  - 08/11/2014  Walked RA x 1 laps @ 185 ft each stopped due to fatigue/off balance/ slow pace/  no sob or desat  - PFT's  09/26/2014  FEV1 2.26 (85 % ) ratio 76  p no % improvement from saba with DLCO  67 % corrects to 93 % for alv volume      Since prev study 08/04/13 minimal change lung vol or dlco    . Embolic cerebral infarction ([place]) 12/06/2015  . Exertional shortness of breath    "sometimes walking" (12/15/2012)  . GERD (gastroesophageal reflux disease)   . Gout 02/09/2015  . Heart murmur    "just told I had one today" (12/15/2012)  . Hiatal hernia   . Hyperlipidemia   . Hypertension   . Hypothyroid   . Influenza  A 03/10/2016  . Insomnia   . Melanoma of back ([place]) 1976  . Myocardial infarction [facility]) 1996; 2011   "both silent" (12/15/2012)  . Nonrheumatic aortic valve stenosis   . Orthostatic hypotension   . Osteoporosis, senile   . Pacemaker   . RBBB   . Restless leg 02/02/2015  . Right leg weakness 12/06/2015  . [facility] spotted fever   . S/P CABG x 4   . Sick sinus syndrome ([place]) 01/31/2014  . Sustained ventricular tachycardia ([place]) 07/27/2014  . Urinary retention 12/30/2014   Past Surgical History:  Procedure Laterality Date  . CARDIAC CATHETERIZATION  04/2010   LIMA to LAD patent,SVG to OM patent,no in-stnet restenosis RCA  . CATARACT EXTRACTION W/ INTRAOCULAR LENS  IMPLANT, BILATERAL Bilateral 2012  . CORONARY ANGIOPLASTY WITH STENT PLACEMENT  07/2009   bare metal stent to SVG to the RCA  . CORONARY ARTERY BYPASS GRAFT  1996   LIMA to LAD,SVG to RCA & SVG to OM  . INSERT / REPLACE / REMOVE PACEMAKER  2010  . MELANOMA EXCISION  05/1974 X2   "taken off my back" (12/15/2012)  . NM MYOVIEW LTD  06/2011   low risk  . TONSILLECTOMY  1938  . TRANSURETHRAL RESECTION OF PROSTATE  1986  . US ECHOCARDIOGRAPHY  07/11/2009   EF 45-50%    reports that he has never smoked. He has never used smokeless tobacco. He reports that he does not drink alcohol or use drugs. Social History   Social History  . Marital status: Married    Spouse name: N/A  . Number of children: 2  . Years of education: Masters   Occupational History  . Retired Company secretary -Pensions consultant    Social History Main Topics  . Smoking status: Never Smoker  . Smokeless tobacco: Never Used  . Alcohol use No  . Drug use: No  . Sexual activity: No   Other Topics Concern  . Not on file   Social History Narrative   Lives at [place] 01/09/15   Married - Violet   Never smoked   Alcohol none   Previously employed as Scientist, forensic for [employer].         Diet:Low sodium   Do you  drink/eat things with caffeine? No   Marital status: Married                              What year were you married?1950   Do you live in a house, apartment, assisted living, condo, trailer, etc)?    Is it one or more stories? 1   How many persons live in your home? 2   Do you have any pets in your home? No   Current or past profession: Minister, [employer]iore[employer]   Do you exercise?     Very Little  Type & how often:    Do you have a living will?  Yes   Do you have a DNR Form? Yes   Do you have a POA/HPOA forms? Yes    Functional Status Survey:    Family History  Problem Relation Age of Onset  . Coronary artery disease Mother   . Diabetes Mother   . Heart disease Mother   . Coronary artery disease Father   . Diabetes Father   . Lung cancer Father   . Heart disease Father   . Anuerysm Son   . Heart disease Brother     Health Maintenance  Topic Date Due  . INFLUENZA VACCINE  09/04/2016  . PNA vac Low Risk Adult (2 of 2 - PCV13) 02/04/2017 (Originally 10/06/2010)  . TETANUS/TDAP  08/01/2026 (Originally 08/01/2024)  . DEXA SCAN  12/14/2024    Allergies  Allergen Reactions  . Altace [Ramipril] Cough  . Crestor [Rosuvastatin Calcium] Rash  . Penicillins Rash and Other (See Comments)    Has patient had a PCN reaction causing immediate rash, facial/tongue/throat swelling, SOB or lightheadedness with hypotension: No Has patient had a PCN reaction causing severe rash involving mucus membranes or skin necrosis: Yes Has patient had a PCN reaction that required hospitalization Yes Has patient had a PCN reaction occurring within the last 10 years: No If all of the above answers are "NO", then may proceed with Cephalosporin use.   . Sulfa Antibiotics Rash    Outpatient Encounter Prescriptions as of 09/09/2016  Medication Sig  . acetaminophen (TYLENOL) 325 MG tablet Take 650 mg by mouth every 8 (eight) hours as needed.   Marland Kitchen  amiodarone (PACERONE) 200 MG tablet Take 400 mg by mouth daily.  Marland Kitchen aspirin EC 81 MG tablet Take 1 tablet (81 mg total) by mouth daily.  . Cholecalciferol (VITAMIN D3) 5000 UNITS CAPS Take 5,000 Units by mouth every Monday.   . clopidogrel (PLAVIX) 75 MG tablet Take 1 tablet (75 mg total) by mouth daily.  . fludrocortisone (FLORINEF) 0.1 MG tablet One daily to help supporrt BP  . guaiFENesin (MUCINEX) 600 MG 12 hr tablet Take 600 mg by mouth daily.   Marland Kitchen levothyroxine (SYNTHROID, LEVOTHROID) 137 MCG tablet Take 1 tablet (137 mcg total) by mouth daily before breakfast.  . midodrine (PROAMATINE) 2.5 MG tablet TAKE 1 TABLET 3 TIMES DAILY WITH MEALS. TAKE 3RD DOSE AT LEAST 6 HOURS BEFORE BEDTIME.  . midodrine (PROAMATINE) 5 MG tablet Take 2 tablets (10 mg total) by mouth 3 (three) times daily with meals.  . pantoprazole (PROTONIX) 40 MG tablet Take 40 mg by mouth 2 (two) times daily.  . pravastatin (PRAVACHOL) 40 MG tablet Take 40 mg by mouth at bedtime.  Marland Kitchen rOPINIRole (REQUIP) 0.5 MG tablet Take 1 tablet (0.5 mg total) by mouth 3 (three) times daily as needed (for restless leg syndrome).  . tamsulosin (FLOMAX) 0.4 MG CAPS capsule TAKE ONE CAPSULE DAILY TO HELP IMPROVE URINARY FREQUENCY.  Marland Kitchen torsemide (DEMADEX) 10 MG tablet Take 10 mg by mouth daily as needed.  . traZODone (DESYREL) 50 MG tablet Take 50 mg by mouth at bedtime. May give an additional 4m 1 hour later if unable to sleep as needed.  . [DISCONTINUED] polyethylene glycol (MIRALAX / GLYCOLAX) packet Take 17 g by mouth daily as needed for mild constipation.   . [DISCONTINUED] senna (SENOKOT) 8.6 MG tablet Take 2 tablets by mouth daily as needed for constipation.    No facility-administered encounter medications on file  as of 09/09/2016.     Review of Systems  Constitutional: Positive for fatigue. Negative for appetite change, chills, diaphoresis and fever.  HENT: Positive for hearing loss and trouble swallowing. Negative for congestion, mouth  sores, nosebleeds, rhinorrhea and voice change.   Eyes: Negative for visual disturbance.  Respiratory: Positive for cough and shortness of breath. Negative for chest tightness and wheezing.        Mostly dry cough improved some per pt and wife, dyspnea present  Cardiovascular: Positive for leg swelling. Negative for chest pain and palpitations.  Gastrointestinal: Positive for diarrhea. Negative for abdominal pain, nausea and vomiting.       Per pt had 2 bowel movement today, staff denies any documented bowel movement, he complaints of loose stool x 3 week and denies blood  Genitourinary: Negative for dysuria and flank pain.       Has indwelling foley catheter for urinary retention  Musculoskeletal: Positive for gait problem. Negative for back pain.       No falls  Skin: Negative for rash.  Neurological: Positive for dizziness, tremors, weakness and light-headedness. Negative for seizures, syncope, numbness and headaches.       Dizziness with position change, using walker with assistance to get around, improved strength compared to a week back  Psychiatric/Behavioral: Positive for confusion. Negative for behavioral problems.    Vitals:   09/09/16 1609  BP: 130/62  Pulse: 70  Resp: 16  Temp: (!) 97.4 F (36.3 C)  TempSrc: Oral  SpO2: 93%  Weight: 186 lb 3.2 oz (84.5 kg)  Height: '5\' 10"'  (1.778 m)   Wt Readings from Last 3 Encounters:  09/09/16 186 lb 3.2 oz (84.5 kg)  08/30/16 182 lb 14.4 oz (83 kg)  08/29/16 182 lb 14.4 oz (83 kg)   Body mass index is 26.72 kg/m. Physical Exam  Constitutional: He is oriented to person, place, and time. He appears well-developed. No distress.  HENT:  Head: Normocephalic and atraumatic.  Mouth/Throat: Oropharynx is clear and moist.  Missing his lower teeth  Eyes: Pupils are equal, round, and reactive to light. Conjunctivae are normal.  Neck: Normal range of motion. Neck supple. No JVD present. No thyromegaly present.  Cardiovascular: Normal  rate and regular rhythm.   Murmur heard. Pulmonary/Chest: Effort normal. No respiratory distress. He has no wheezes. He has rales. He exhibits no tenderness.  Abdominal: Soft. Bowel sounds are normal. He exhibits no distension. There is no tenderness.  Genitourinary:  Genitourinary Comments: Foley catheter in place with clear urine  Musculoskeletal: He exhibits edema.  Can move all 4 extremities, weakness to his legs, arthritis changes present, wide based gait and uses walker. 2+ pitting pedal edema  Lymphadenopathy:    He has no cervical adenopathy.  Neurological: He is alert and oriented to person, place, and time.  Slow with conversation and does get confused about incidences  Skin: Skin is warm and dry. No rash noted. He is not diaphoretic. No erythema.  Easy bruising,   Psychiatric: He has a normal mood and affect.    Labs reviewed: Basic Metabolic Panel:  Recent Labs  03/10/16 1443 03/11/16 0436  05/08/16 08/01/16 0022 08/15/16  NA 140 139  < > 142 137 141  K 3.5 3.7  < > 4.1 4.1 3.7  CL 104 108  --   --  105  --   CO2 29 25  --   --  27  --   GLUCOSE 96 105*  --   --  104*  --   BUN 22* 18  < > '13 15 14  ' CREATININE 1.03 0.72  < > 0.8 1.00 0.9  CALCIUM 8.7* 8.0*  --   --  8.5*  --   < > = values in this interval not displayed. Liver Function Tests:  Recent Labs  01/10/16 1400 03/10/16 1443 03/11/16 0436 04/03/16 05/08/16 08/15/16  AST 25 53* 46* '15 20 22  ' ALT '22 26 23 14 19 21  ' ALKPHOS 68 61 51 60 78 62  BILITOT 0.4 0.6 0.4  --   --   --   PROT 6.4* 6.9 5.8*  --   --   --   ALBUMIN 3.5 3.7 3.1*  --   --   --    No results for input(s): LIPASE, AMYLASE in the last 8760 hours. No results for input(s): AMMONIA in the last 8760 hours. CBC:  Recent Labs  01/10/16 1400  03/10/16 1443  07/31/16 1359 08/01/16 0022 08/06/16 1641 08/15/16  WBC 6.0  < > 8.3  < > CANCELED 6.9 7.0 5.4  5.4  NEUTROABS 4.1  --  5.8  --  CANCELED  --   --   --   HGB 14.1  < > 13.1   < > CANCELED 12.9* 14.3 12.5*  12.5*  HCT 42.7  < > 39.5  < > CANCELED 38.3* 42.1 37*  37*  MCV 97.7  < > 95.6  < > CANCELED 94.3 94  --   PLT 190  < > 190  < > CANCELED 209 243 202  < > = values in this interval not displayed. Cardiac Enzymes:  Recent Labs  01/10/16 1904 01/10/16 2342 01/11/16 0537  TROPONINI <0.03 <0.03 <0.03   BNP: Invalid input(s): POCBNP Lab Results  Component Value Date   HGBA1C 5.6 12/07/2015   Lab Results  Component Value Date   TSH 1.96 05/08/2016   No results found for: VITAMINB12 No results found for: FOLATE No results found for: IRON, TIBC, FERRITIN  Imaging and Procedures obtained prior to SNF admission: Ct Abdomen Pelvis W Contrast  Result Date: 08/01/2016 CLINICAL DATA:  83 y/o M; increasing lower abdominal pressure, chronic foley, abd distention. no nausea or vomiting. EXAM: CT ABDOMEN AND PELVIS WITH CONTRAST TECHNIQUE: Multidetector CT imaging of the abdomen and pelvis was performed using the standard protocol following bolus administration of intravenous contrast. CONTRAST:  100 cc Isovue-300 COMPARISON:  12/29/2014 CT of the abdomen and pelvis. FINDINGS: Lower chest: Large hiatal hernia containing the entirety of the stomach. Partially visualized pacemaker leads in right atrium and ventricle. Mitral valvular calcification. Hepatobiliary: No focal liver abnormality is seen. No gallstones, gallbladder wall thickening, or biliary dilatation. Pancreas: Unremarkable. No pancreatic ductal dilatation or surrounding inflammatory changes. Spleen: Normal in size without focal abnormality. Adrenals/Urinary Tract: Stable bilateral renal cysts the largest in the left lower pole measuring up to 4.9 cm. No urinary stone disease or hydronephrosis. The bladder is collapsed around the Foley catheter. Diffuse bladder wall thickening. Stomach/Bowel: No obstructive or inflammatory changes of the bowel. Large volume of stool throughout the colon. Vascular/Lymphatic: 14  mm penetrating ulcer directed posteriorly and inferiorly from the left lateral wall of infrarenal abdominal aorta (series 4, image 8). Severe calcific atherosclerosis of the abdominal aorta. Reproductive: Prostate is unremarkable. Other: No abdominal wall hernia or abnormality. No abdominopelvic ascites. Musculoskeletal: Advanced degenerative changes of the visible thoracic and lumbar spine. No acute osseous abnormality is identified. Mild bilateral hip osteoarthrosis. IMPRESSION: 1. Bladder  collapsed around the Foley catheter. Bladder wall thickening more than expected for underdistention, this may represent cystitis or be due to prior hypertrophy. 2. Large volume of stool in the colon, probably constipation. 3. Large hiatal hernia containing the entirety of stomach. 4. 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 Electronically Signed   By: Kristine Garbe M.D.   On: 08/01/2016 03:12   Chest X-Ray 2 Views 08/14/16 Impression. Cardiomegaly, chronic and postoperative changes as discussed. No evidence of acute pulmonary pathology  Echocardiogram 08/20/16 Moderate AS, severe LV systolic dysfunction with abnormal relaxation, EF 35%, right lead pacemaker in place   Assessment/Plan  1. Diarrhea, unspecified type Ongoing. Was on antibiotic at SNF for UTI but per pt and family on further questioning, this has been going on from prior to transfer to SNF. Not taking stool softner/ laxative any more. Guaiac stool negative. Maintain hydration. Po intake fair.  - loperamide (IMODIUM A-D) 2 MG tablet; Take 1 tablet (2 mg total) by mouth 3 (three) times daily as needed for diarrhea or loose stools.  Dispense: 30 tablet; Refill: 0 - CMP with eGFR  2. Chronic combined systolic and diastolic congestive heart failure (HCC) Poor EF. Followed by cardiology. Exam finding suggestive of fluid overload with 4 lb weight gain. Advised to take torsemide 10 mg daily x 3 days and then as needed. Daily  weight check at home.  - CMP with eGFR  3. RLS (restless legs syndrome) On prn requip, I do not want to schedule this given his chronic hypotension, explained the side effect of medication and to use with caution. Pt and wife agrees.   4. Leg edema See above. Advised to keep legs elevated as tolerated.   5. Dizziness Persists. Slow position change encouraged. Fall prevention. Continue florinef and his midodrine.    I spent 40 minutes in total face-to-face time with the patient, more than 50% of which was spent in counseling and coordination of care, reviewing test results, reviewing medication and discussing or reviewing the diagnosis with patient and his wife.   Blanchie Serve, MD Internal Medicine [facility] [address] [place], [place] [address] Cell Phone (Monday-Friday 8 am - 5 pm): [phone] On Call: [phone] and follow prompts after 5 pm and on weekends Office Phone: [phone] Office Fax: [phone]

## 2016-09-11 ENCOUNTER — Telehealth: Payer: Self-pay | Admitting: *Deleted

## 2016-09-11 NOTE — Telephone Encounter (Signed)
Patient is to take midodrine 10 mg three times a day that is 2 tablet of 5 mg three times a day. I am not sure about the dose change. [place] can you look into this?

## 2016-09-11 NOTE — Telephone Encounter (Signed)
Patient daughter, Caryl Asp called and stated that patient's medication was refilled on 09/09/16 for the wrong dosage. Stated that the patient gets Midodrine 5mg  Two tablets three times daily but Midodrine 2.5mg  One tablet three times daily with the 3rd dose at least 6 hours before bedtime was called in. Daughter is wondering why this was changed. I reviewed last OV note. Please call daughter [phone]

## 2016-09-12 ENCOUNTER — Other Ambulatory Visit: Payer: Self-pay

## 2016-09-12 ENCOUNTER — Ambulatory Visit: Payer: Medicare Other | Admitting: Internal Medicine

## 2016-09-12 ENCOUNTER — Telehealth: Payer: Self-pay | Admitting: Cardiovascular Disease

## 2016-09-12 MED ORDER — MIDODRINE HCL 5 MG PO TABS: 10.0000 mg | ORAL_TABLET | Freq: Three times a day (TID) | ORAL | 3 refills | Status: DC

## 2016-09-12 NOTE — Telephone Encounter (Signed)
The wrong Midodrine dose was sent. So I sent the 5 mg 2 tablets three times daily script to the pharmacy and called and spoke with Adam [name] to let her know that issue was resolved and that she should be receiving the correct midodrine today when its delivered.

## 2016-09-12 NOTE — Telephone Encounter (Signed)
I checked in patient at his last clinic visit. His wife was with him and told me he needed a refill on his midodrine and had the prescription bottle with her. She said it was supposed to have been sent in at his last visit but the pharmacy never received it. I looked at his med list in [facility] and it was put in as in injection not as an e-scribe prescription, so it was never sent/received by the pharmacy. I deleted that off his med list and sent in the e-scribe based on the label on the prescription bottle that was provided to me by the patients wife.

## 2016-09-13 ENCOUNTER — Telehealth: Payer: Self-pay | Admitting: Internal Medicine

## 2016-09-13 NOTE — Telephone Encounter (Signed)
Spoke with pt's daughter Caryl Asp, aware of recs.  Nothing further needed at this time.

## 2016-09-13 NOTE — Telephone Encounter (Signed)
It's not toxic, may cause a little gi upset but nothing but time needed to wear off > call pcp if not better over the next few days

## 2016-09-13 NOTE — Telephone Encounter (Signed)
lmtcb X1 for [employer]

## 2016-09-13 NOTE — Telephone Encounter (Signed)
Called and spoke with pts daughter and she stated that the pts mother gave her dad extra mucinex and did not realize that these were already in his med box.  The pt took 1200 mg of the mucinex this am and then did not feel like eating and felt very weak and fatigued.  Pts daughter wanted to know if they need to do anything else for him?  MW please advise. thanks

## 2016-09-13 NOTE — Telephone Encounter (Signed)
Patient's daughter is returning phone call [phone]

## 2016-09-17 ENCOUNTER — Other Ambulatory Visit (INDEPENDENT_AMBULATORY_CARE_PROVIDER_SITE_OTHER): Payer: Medicare Other

## 2016-09-17 ENCOUNTER — Ambulatory Visit (INDEPENDENT_AMBULATORY_CARE_PROVIDER_SITE_OTHER)
Admission: RE | Admit: 2016-09-17 | Discharge: 2016-09-17 | Disposition: A | Discharge status: Home / Self Care | Payer: Medicare Other | Source: Ambulatory Visit | Attending: Internal Medicine | Admitting: Internal Medicine

## 2016-09-17 ENCOUNTER — Encounter: Payer: Self-pay | Admitting: Internal Medicine

## 2016-09-17 ENCOUNTER — Ambulatory Visit (INDEPENDENT_AMBULATORY_CARE_PROVIDER_SITE_OTHER): Payer: Medicare Other | Admitting: Internal Medicine

## 2016-09-17 VITALS — BP 104/62 | HR 70 | Ht 70.0 in | Wt 182.0 lb

## 2016-09-17 DIAGNOSIS — R0600 Dyspnea, unspecified: Secondary | ICD-10-CM

## 2016-09-17 DIAGNOSIS — R0609 Other forms of dyspnea: Secondary | ICD-10-CM

## 2016-09-17 DIAGNOSIS — R05 Cough: Secondary | ICD-10-CM

## 2016-09-17 DIAGNOSIS — R058 Other specified cough: Secondary | ICD-10-CM

## 2016-09-17 LAB — CBC WITH DIFFERENTIAL/PLATELET
Basophils Absolute: 0.1 10*3/uL (ref 0.0–0.1)
Basophils Relative: 1.1 % (ref 0.0–3.0)
Eosinophils Absolute: 0.1 10*3/uL (ref 0.0–0.7)
Eosinophils Relative: 2.1 % (ref 0.0–5.0)
HCT: 40.5 % (ref 39.0–52.0)
Hemoglobin: 13.4 g/dL (ref 13.0–17.0)
Lymphocytes Relative: 22.5 % (ref 12.0–46.0)
Lymphs Abs: 1.4 10*3/uL (ref 0.7–4.0)
MCHC: 33.1 g/dL (ref 30.0–36.0)
MCV: 98.6 fl (ref 78.0–100.0)
Monocytes Absolute: 0.6 10*3/uL (ref 0.1–1.0)
Monocytes Relative: 9.6 % (ref 3.0–12.0)
Neutro Abs: 4 10*3/uL (ref 1.4–7.7)
Neutrophils Relative %: 64.7 % (ref 43.0–77.0)
Platelets: 228 10*3/uL (ref 150.0–400.0)
RBC: 4.11 Mil/uL — ABNORMAL LOW (ref 4.22–5.81)
RDW: 15.7 % — ABNORMAL HIGH (ref 11.5–15.5)
WBC: 6.1 10*3/uL (ref 4.0–10.5)

## 2016-09-17 LAB — BASIC METABOLIC PANEL
BUN: 12 mg/dL (ref 6–23)
CO2: 28 mEq/L (ref 19–32)
Calcium: 8.6 mg/dL (ref 8.4–10.5)
Chloride: 106 mEq/L (ref 96–112)
Creatinine, Ser: 0.79 mg/dL (ref 0.40–1.50)
GFR: 98.45 mL/min (ref >60.00)
Glucose, Bld: 107 mg/dL — ABNORMAL HIGH (ref 70–99)
Potassium: 4.1 mEq/L (ref 3.5–5.1)
Sodium: 139 mEq/L (ref 135–145)

## 2016-09-17 LAB — BRAIN NATRIURETIC PEPTIDE: Pro B Natriuretic peptide (BNP): 721 pg/mL — ABNORMAL HIGH (ref 0.0–100.0)

## 2016-09-17 LAB — SEDIMENTATION RATE: Sed Rate: 21 mm/hr — ABNORMAL HIGH (ref 0–20)

## 2016-09-17 LAB — TSH: TSH: 1.53 u[IU]/mL (ref 0.35–4.50)

## 2016-09-17 NOTE — Assessment & Plan Note (Addendum)
- 08/11/2014  Walked RA x 1 laps @ 185 ft each stopped due to fatigue/off balance/ slow pace/  no sob or desat  - PFT's  09/26/2014  FEV1 2.26 (85 % ) ratio 76  p no % improvement from saba with DLCO  67 % corrects to 93 % for alv volume      Since prev study 08/04/13 minimal change lung vol or dlco  09/17/2016   Walked RA x one lap @ 185 stopped due to  Sob, no desat, slow pace with walker, no desat    Symptoms are markedly disproportionate to objective findings and not clear this is actually much of a  lung problem but pt does appear to have difficult to sort out respiratory symptoms of unknown origin for which  DDX  = almost all start with A and  include Adherence, Ace Inhibitors, Acid Reflux, Active Sinus Disease, Alpha 1 Antitripsin deficiency, Anxiety masquerading as Airways dz,  ABPA,  Allergy(esp in young), Aspiration (esp in elderly), Adverse effects of meds,  Active smokers, A bunch of PE's (a small clot burden can't cause this syndrome unless there is already severe underlying pulm or vascular dz with poor reserve) plus two Bs  = Bronchiectasis and Beta blocker use..and one C= CHF     Adherence is always the initial "prime suspect" and is a multilayered concern that requires a "trust but verify" approach in every patient - starting with knowing how to use medications, especially inhalers, correctly, keeping up with refills and understanding the fundamental difference between maintenance and prns vs those medications only taken for a very short course and then stopped and not refilled.   ? Acid (or non-acid) GERD > always difficult to exclude as up to 75% of pts in some series report no assoc GI/ Heartburn symptoms> rec continue max (24h)  acid suppression and diet restrictions/ reviewed     ? Adverse effects of meds, esp Amiodarone, but esr not suggestive, ok to leave on for now   ? Allergy >Eos only 0.1  So unlikley but can try  add zyrtec, consider short course prednisone   ? Active sinus dz  > see UACS  ? A bunch of pe's >  D dimer nl - while a high  nl valute  may miss small peripheral pe, the clot burden with sob is moderately high and the d dimer has a very high neg pred value in this setting    ? Anxiety/depression/deconditioning all a concern and note already on psychotropics > Follow up per Primary Care planned    ? BB effect > not on BB so n/a  ? chf >   BNP is over 500 so likely there is a component and cxr confirms extra lung water so rec try doubling dose of demadex if tol until next ov though realize florinef if problematic in this setting.   I had an extended discussion with the patient reviewing all relevant studies completed to date and  lasting 25 minutes of a 40  minute office  visit to re-establish     re  severe non-specific but potentially very serious refractory respiratory symptoms of uncertain and potentially multiple  etiologies.   Each maintenance medication was reviewed in detail including most importantly the difference between maintenance and prns and under what circumstances the prns are to be triggered using an action plan format that is not reflected in the computer generated alphabetically organized AVS.    Please see AVS for specific instructions  unique to this office visit that I personally wrote and verbalized to the the pt in detail and then reviewed with pt  by my nurse highlighting any changes in therapy/plan of care  recommended at today's visit.

## 2016-09-17 NOTE — Patient Instructions (Addendum)
Make sure you take your protonix 40 mg Take 30- 60 min before your first and last meals of the day   Add zyrtec 10 mg at least  One hour before bedtime    GERD (REFLUX)  is an extremely common cause of respiratory symptoms just like yours , many times with no obvious heartburn at all.    It can be treated with medication, but also with lifestyle changes including elevation of the head of your bed (ideally with 6 inch  bed blocks),  Smoking cessation, avoidance of late meals, excessive alcohol, and avoid fatty foods, chocolate, peppermint, colas, red wine, and acidic juices such as orange juice.  NO MINT OR MENTHOL PRODUCTS SO NO COUGH DROPS   USE SUGARLESS CANDY INSTEAD (Jolley ranchers or Stover's or Life Savers) or even ice chips will also do - the key is to swallow to prevent all throat clearing. NO OIL BASED VITAMINS - use powdered substitutes.    Please remember to go to the lab and x-ray department downstairs in the basement  for your tests - we will call you with the results when they are available.     Please schedule a follow up office visit in 4 weeks, sooner if needed  Rec: double dose of demadex

## 2016-09-17 NOTE — Assessment & Plan Note (Addendum)
-   h/o acei intolerance  - allergy profile 07/14/2014 >  Eos 0.2 / IgE 16 with neg RAST - sinus CT 07/21/2014 >>There is minimal mucosal thickening in the inferior right maxillary sinus which has a chronic appearance. No air-fluid levels or evidence of acute sinusitis - Huge HH on cxr  08/11/2014   - challenge with zyrtec 10 mg each pm 09/17/2016 >>>    Upper airway cough syndrome (previously labeled PNDS) , is  so named because it's frequently impossible to sort out how much is  CR/sinusitis with freq throat clearing (which can be related to primary GERD)   vs  causing  secondary (" extra esophageal")  GERD from wide swings in gastric pressure that occur with throat clearing, often  promoting self use of mint and menthol lozenges that reduce the lower esophageal sphincter tone and exacerbate the problem further in a cyclical fashion.   These are the same pts (now being labeled as having "irritable larynx syndrome" by some cough centers) who not infrequently have a history of having failed to tolerate ace inhibitors(as is the case here) ,  dry powder inhalers or biphosphonates or report having atypical/extraesophageal reflux symptoms (as may be the case here)  that don't respond to standard doses of PPI  and are easily confused as having aecopd or asthma flares by even experienced allergists/ pulmonologists (myself included).   Will start with max rx for gerd/ pnds (zyrtec) and return in 4 weeks to regroup see avs for instructions unique to this ov

## 2016-09-17 NOTE — Progress Notes (Signed)
Subjective:    Patient ID: Adam [name], male    DOB: 09/14/1926,     MRN: [id number]    Brief patient profile:  79 yowm never smoker new onset cough summer of 2015 with some bloody mucus/ turned yellow p  zpak esp in am's then bloody again x end of May  2016 > [name] referred 07/14/2014 to pulmonary clinic    History of Present Illness  07/14/2014 1st [facility] office visit/ [facility]   Chief Complaint  Patient presents with  . Pulmonary Consult    Referred by [name].  Pt c/o cough for the past year or so- prod in the am with yellow sputum. He has had blood streaked sputum for the past 3 wks.  He feels weak in general and voice has changed. He also has had night sweats.   pt has baseline gerd / large HH on ppi with bfast daily but most severe coughing is before bfast, not really much worse x one year but now blood streaked- no epistaxis/ on asa and plavix  rec Please see patient coordinator before you leave today  to schedule sinus CT > neg  Pantoprazole (protonix) 40 mg   Take  30-60 min before first meal of the day and Pepcid ac (famotidine)  20 mg one @  bedtime until return to office - this is the best way to tell whether stomach acid is contributing to your problem.   GERD diet     07/26/14 started on Amiodarone     09/17/2016  f/u ov/[facility] re: worse cough / sob no 02/ still on amiodarone  And referred back to [facility] clinic by [name] who noted crackles on exam  Chief Complaint  Patient presents with  . Acute Visit    pt c/o worsening sometimes prod cough with yellow mucus X1 year.  Does note some chest tightness.    cough is mostly hs but also daytime not related to eating or outdoors - some assoc nasal congestion  Doe x 50 fts worse x 1 years also  On florinef for orthostatic symptoms with leg swelling  Having to sleep in recliner due to cough x 1 year,  45 degrees helps    No obvious day to day or daytime variability or assoc excess/ purulent sputum or  mucus plugs or hemoptysis or cp or chest tightness, subjective wheeze or overt sinus or hb symptoms. No unusual exp hx or h/o childhood pna/ asthma or knowledge of premature birth.    Also denies any obvious fluctuation of symptoms with weather or environmental changes or other aggravating or alleviating factors except as outlined above   Current Medications, Allergies, Complete Past Medical History, Past Surgical History, Family History, and Social History were reviewed in [employer] record.  ROS  The following are not active complaints unless bolded sore throat, dysphagia, dental problems, itching, sneezing,  nasal congestion or excess/ purulent secretions, ear ache,   fever, chills, sweats, unintended wt loss, classically pleuritic or exertional cp,  orthopnea pnd or leg swelling, presyncope, palpitations, abdominal pain, anorexia, nausea, vomiting, diarrhea  or change in bowel or bladder habits, change in stools or urine, dysuria,hematuria,  rash, arthralgias, visual complaints, headache, numbness, weakness or ataxia or problems with walking or coordination,  change in mood/affect or memory.                   Objective:   Physical Exam   amb wm nad /  mild voice fatigue  Using rollator    08/11/2014          181 > 09/26/2014    180 > 09/17/2016   182  Wt Readings from Last 3 Encounters:  07/14/14 180 lb (81.647 kg)  01/29/14 187 lb 3.2 oz (84.913 kg)  10/12/13 182 lb 1.6 oz (82.6 kg)    Vital signs reviewed  - Note on arrival 02 sats  96% on RA     HEENT: nl   turbinates, and orophanx. Nl external ear canals without cough reflex - missing all bottom teeth and most of upper   NECK :  without JVD/Nodes/TM/ nl carotid upstrokes bilaterally   LUNGS: no acc muscle use, nl contour with bilateral diffuse insp crackles s cough on insp/ no exp rhonchi or exp coughing   CV:  RRR  no s3   II/VI SEM / no increase in P2,  With elastic hose and R> L pitting edema    ABD:  soft and nontender with nl excursion in the supine position. No bruits or organomegaly, bowel sounds nl  MS:  warm without deformities, calf tenderness, cyanosis or clubbing  SKIN: warm and dry without lesions    NEURO:  alert, approp, no deficits        CXR PA and Lateral:   09/17/2016 :    I personally reviewed images and agree with radiology impression as follows:    1. Cardiac pacer noted in stable position. Prior CABG. Congestive heart failure mild basilar interstitial edema and small bilateral pleural effusions.  2. Large hiatal hernia.    Labs ordered/ reviewed:      Chemistry      Component Value Date/Time   NA 139 09/17/2016 1517   NA 141 08/15/2016   K 4.1 09/17/2016 1517   CL 106 09/17/2016 1517   CO2 28 09/17/2016 1517   BUN 12 09/17/2016 1517   BUN 14 08/15/2016   CREATININE 0.79 09/17/2016 1517   CREATININE 0.93 09/06/2014 0944   GLU 105 08/15/2016      Component Value Date/Time   CALCIUM 8.6 09/17/2016 1517   ALKPHOS 62 08/15/2016   AST 22 08/15/2016   ALT 21 08/15/2016   BILITOT 0.4 03/11/2016 0436        Lab Results  Component Value Date   WBC 6.1 09/17/2016   HGB 13.4 09/17/2016   HCT 40.5 09/17/2016   MCV 98.6 09/17/2016   PLT 228.0 09/17/2016     Lab Results  Component Value Date   DDIMER 0.75 (H) 09/17/2016      Lab Results  Component Value Date   TSH 1.53 09/17/2016     Lab Results  Component Value Date   PROBNP 721.0 (H) 09/17/2016       Lab Results  Component Value Date   ESRSEDRATE 21 (H) 09/17/2016   ESRSEDRATE 37 (H) 08/11/2014            Assessment & Plan:

## 2016-09-18 ENCOUNTER — Non-Acute Institutional Stay: Payer: Medicare Other | Admitting: Internal Medicine

## 2016-09-18 ENCOUNTER — Encounter: Payer: Self-pay | Admitting: Internal Medicine

## 2016-09-18 VITALS — BP 118/62 | HR 70 | Temp 97.6°F | Resp 16 | Ht 70.0 in | Wt 180.8 lb

## 2016-09-18 DIAGNOSIS — I5043 Acute on chronic combined systolic (congestive) and diastolic (congestive) heart failure: Secondary | ICD-10-CM

## 2016-09-18 LAB — D-DIMER, QUANTITATIVE (NOT AT ARMC): D-Dimer, Quant: 0.75 mcg/mL FEU — ABNORMAL HIGH (ref <0.50)

## 2016-09-18 NOTE — Progress Notes (Signed)
Provider:  Blanchie Serve MD  Location:  [place] of Service:  Clinic (12)  PCP: Blanchie Serve, MD Patient Care Team: Blanchie Serve, MD as PCP - General (Internal Medicine) Croitoru, Dani Gobble, MD as Attending Physician (Cardiology) Vevelyn Royals, MD as Consulting Physician (Ophthalmology) Franchot Gallo, MD as Consulting Physician (Urology) Tanda Rockers, MD as Consulting Physician (Pulmonary Disease) Allyn Kenner, MD (Dermatology) Deliah Goody, PA-C as Physician Assistant (Physician Assistant) Ngetich, Nelda Bucks, NP as Nurse Practitioner (Family Medicine)  Extended Emergency Contact Information Primary Emergency Contact: Bancroft,[name] Address: [address]          [place] Phone: [phone] Mobile Phone: [phone] Relation: Spouse Secondary Emergency Contact: [name],Lahman  [place] Phone: [phone] Mobile Phone: [phone] Relation: Daughter  Code Status: Full Code  Goals of Care: Advanced Directive information Advanced Directives 08/30/2016  Does Patient Have a Medical Advance Directive? Yes  Type of Advance Directive [place]  Does patient want to make changes to medical advance directive? -  Copy of [place] in Chart? Yes  Would patient like information on creating a medical advance directive? -  Pre-existing out of facility DNR order (yellow form or pink MOST form) -      Chief Complaint  Patient presents with  . Acute Visit    Abnormal labs  . Medication Refill    No refills needed at this time  . Results    Discuss lab results    HPI: Patient is a 83 y.o. male seen today for acute visit with dyspnea and elevated BNP. Lab ordered in pulmonology clinic. Pt seen with his wife. Has increased dyspnea and leg edema. BP stable on review in OV today. Has hx of chf with EF 35%.  Past Medical History:  Diagnosis Date  . Arthritis     "minor, back and sometimes knees" (12/15/2012)  . Bradycardia    AFib/SSS s/p St Jude PPM 04/12/2008  . CAD (coronary artery disease) 12/30/2014   CABG (LIMA-LAD, SVG-RCA, SVG-OM in 1996).  07/2009 BMS to SVG-RCA. Cath in 04/2010 with patent stents   . Cardiomyopathy, ischemic 08/25/2012  . CHF (congestive heart failure) ([place])   . Chronic knee pain 12/03/2014  . Combined congestive systolic and diastolic heart failure ([place]) 02/02/2015   Hx EF 41%. BNP 96.8 02/21/15 Torsemide 04/06/15 Na 142, K 4.6, Bun 16, creat 0.89 04/20/14 BNP 111.7, Na 142, K 4.6, Bun 16, creat 0.9   . Depression with anxiety 02/02/2015   02/21/15 Hgb A1c 5.8 03/10/15 MMSE 30/30   . Dizziness, after diuretic asscoiated with hypotension and responded to fluid bolus 06/05/2011   04/28/15 US carotid R+L normal bilateral arterial velocities.    Marland Kitchen Dyspnea 08/11/2014   Followed in Pulmonary clinic/ [facility]/ [place]  - 08/11/2014  Walked RA x 1 laps @ 185 ft each stopped due to fatigue/off balance/ slow pace/  no sob or desat  - PFT's  09/26/2014  FEV1 2.26 (85 % ) ratio 76  p no % improvement from saba with DLCO  67 % corrects to 93 % for alv volume      Since prev study 08/04/13 minimal change lung vol or dlco    . Embolic cerebral infarction ([place]) 12/06/2015  . Exertional shortness of breath    "sometimes walking" (12/15/2012)  . GERD (gastroesophageal reflux disease)   . Gout 02/09/2015  . Heart murmur    "  just told I had one today" (12/15/2012)  . Hiatal hernia   . Hyperlipidemia   . Hypertension   . Hypothyroid   . Influenza A 03/10/2016  . Insomnia   . Melanoma of back ([place]) 1976  . Myocardial infarction [facility]) 1996; 2011   "both silent" (12/15/2012)  . Nonrheumatic aortic valve stenosis   . Orthostatic hypotension   . Osteoporosis, senile   . Pacemaker   . RBBB   . Restless leg 02/02/2015  . Right leg weakness 12/06/2015  . [facility] spotted fever   . S/P CABG x 4   . Sick sinus syndrome ([place]) 01/31/2014  .  Sustained ventricular tachycardia ([place]) 07/27/2014  . Urinary retention 12/30/2014   Past Surgical History:  Procedure Laterality Date  . CARDIAC CATHETERIZATION  04/2010   LIMA to LAD patent,SVG to OM patent,no in-stnet restenosis RCA  . CATARACT EXTRACTION W/ INTRAOCULAR LENS  IMPLANT, BILATERAL Bilateral 2012  . CORONARY ANGIOPLASTY WITH STENT PLACEMENT  07/2009   bare metal stent to SVG to the RCA  . CORONARY ARTERY BYPASS GRAFT  1996   LIMA to LAD,SVG to RCA & SVG to OM  . INSERT / REPLACE / REMOVE PACEMAKER  2010  . MELANOMA EXCISION  05/1974 X2   "taken off my back" (12/15/2012)  . NM MYOVIEW LTD  06/2011   low risk  . TONSILLECTOMY  1938  . TRANSURETHRAL RESECTION OF PROSTATE  1986  . US ECHOCARDIOGRAPHY  07/11/2009   EF 45-50%    reports that he has never smoked. He has never used smokeless tobacco. He reports that he does not drink alcohol or use drugs. Social History   Social History  . Marital status: Married    Spouse name: N/A  . Number of children: 2  . Years of education: Masters   Occupational History  . Retired Company secretary -Pensions consultant    Social History Main Topics  . Smoking status: Never Smoker  . Smokeless tobacco: Never Used  . Alcohol use No  . Drug use: No  . Sexual activity: No   Other Topics Concern  . Not on file   Social History Narrative   Lives at [place] to [place] 01/09/15   Married - Violet   Never smoked   Alcohol none   Previously employed as Scientist, forensic for [employer].         Diet:Low sodium   Do you drink/eat things with caffeine? No   Marital status: Married                              What year were you married?1950   Do you live in a house, apartment, assisted living, condo, trailer, etc)?    Is it one or more stories? 1   How many persons live in your home? 2   Do you have any pets in your home? No   Current or past profession: Minister, [employer]   Do you exercise?     Very Little                                                  Type & how often:    Do you have a living will?  Yes   Do you have a DNR Form? Yes  Do you have a POA/HPOA forms? Yes    Functional Status Survey:    Family History  Problem Relation Age of Onset  . Coronary artery disease Mother   . Diabetes Mother   . Heart disease Mother   . Coronary artery disease Father   . Diabetes Father   . Lung cancer Father   . Heart disease Father   . Anuerysm Son   . Heart disease Brother     Health Maintenance  Topic Date Due  . INFLUENZA VACCINE  11/04/2016 (Originally 09/04/2016)  . PNA vac Low Risk Adult (2 of 2 - PCV13) 02/04/2017 (Originally 10/06/2010)  . TETANUS/TDAP  08/01/2026 (Originally 08/01/2024)  . DEXA SCAN  12/14/2024    Allergies  Allergen Reactions  . Altace [Ramipril] Cough  . Crestor [Rosuvastatin Calcium] Rash  . Penicillins Rash and Other (See Comments)    Has patient had a PCN reaction causing immediate rash, facial/tongue/throat swelling, SOB or lightheadedness with hypotension: No Has patient had a PCN reaction causing severe rash involving mucus membranes or skin necrosis: Yes Has patient had a PCN reaction that required hospitalization Yes Has patient had a PCN reaction occurring within the last 10 years: No If all of the above answers are "NO", then may proceed with Cephalosporin use.   . Sulfa Antibiotics Rash    Outpatient Encounter Prescriptions as of 09/18/2016  Medication Sig  . acetaminophen (TYLENOL) 325 MG tablet Take 650 mg by mouth as needed.   Marland Kitchen amiodarone (PACERONE) 200 MG tablet Take 400 mg by mouth daily.  Marland Kitchen aspirin EC 81 MG tablet Take 1 tablet (81 mg total) by mouth daily.  . cetirizine (ZYRTEC) 10 MG tablet Take 10 mg by mouth daily. 1 hour before bedtime  . Cholecalciferol (VITAMIN D3) 5000 UNITS CAPS Take 5,000 Units by mouth every Monday.   . clopidogrel (PLAVIX) 75 MG tablet Take 1 tablet (75 mg total) by mouth daily.  . fludrocortisone  (FLORINEF) 0.1 MG tablet One daily to help supporrt BP  . guaiFENesin (MUCINEX) 600 MG 12 hr tablet Take 600 mg by mouth daily.   Marland Kitchen levothyroxine (SYNTHROID, LEVOTHROID) 137 MCG tablet Take 1 tablet (137 mcg total) by mouth daily before breakfast.  . midodrine (PROAMATINE) 5 MG tablet Take 2 tablets (10 mg total) by mouth 3 (three) times daily with meals.  . pantoprazole (PROTONIX) 40 MG tablet Take 40 mg by mouth 2 (two) times daily.  . pravastatin (PRAVACHOL) 40 MG tablet Take 40 mg by mouth at bedtime.  Marland Kitchen rOPINIRole (REQUIP) 0.5 MG tablet Take 0.5 mg by mouth daily as needed.   . torsemide (DEMADEX) 10 MG tablet Take 10 mg by mouth daily as needed.  . traZODone (DESYREL) 50 MG tablet Take 50 mg by mouth at bedtime. May give an additional 25mg  1 hour later if unable to sleep as needed.  . [DISCONTINUED] loperamide (IMODIUM A-D) 2 MG tablet Take 1 tablet (2 mg total) by mouth 3 (three) times daily as needed for diarrhea or loose stools. (Patient not taking: Reported on 09/18/2016)  . [DISCONTINUED] midodrine (PROAMATINE) 2.5 MG tablet TAKE 1 TABLET 3 TIMES DAILY WITH MEALS. TAKE 3RD DOSE AT LEAST 6 HOURS BEFORE BEDTIME.  . [DISCONTINUED] rOPINIRole (REQUIP) 0.5 MG tablet Take 1 tablet (0.5 mg total) by mouth 3 (three) times daily as needed (for restless leg syndrome).  . [DISCONTINUED] tamsulosin (FLOMAX) 0.4 MG CAPS capsule TAKE ONE CAPSULE DAILY TO HELP IMPROVE URINARY FREQUENCY. (Patient not taking: Reported on 09/18/2016)  No facility-administered encounter medications on file as of 09/18/2016.     Review of Systems  Constitutional: Positive for fatigue. Negative for appetite change, chills and fever.       Gets tired easily. Has been receiving therapy sessions with PT, done with OT  HENT: Positive for hearing loss and trouble swallowing.   Eyes: Negative for visual disturbance.  Respiratory: Positive for cough, chest tightness and shortness of breath. Negative for wheezing.        Mostly  dry cough, denies worsening of cough. Has ongoing dyspnea, no recent worsening  Cardiovascular: Positive for leg swelling. Negative for chest pain and palpitations.       Pt feels it has increased  Gastrointestinal: Negative for nausea and vomiting.  Genitourinary: Negative for dysuria.       Has indwelling foley catheter for urinary retention  Musculoskeletal: Positive for gait problem. Negative for back pain.       No falls  Neurological: Positive for dizziness, tremors and weakness. Negative for seizures, syncope, numbness and headaches.       Dizziness with position change, using walker with assistance to get around, improved strength      Vitals:   09/18/16 1115  BP: 118/62  Pulse: 70  Resp: 16  Temp: 97.6 F (36.4 C)  TempSrc: Oral  SpO2: 96%  Weight: 180 lb 12.8 oz (82 kg)  Height: 5\' 10"  (1.778 m)   Wt Readings from Last 3 Encounters:  09/18/16 180 lb 12.8 oz (82 kg)  09/17/16 182 lb (82.6 kg)  09/09/16 186 lb 3.2 oz (84.5 kg)   Body mass index is 25.94 kg/m. Physical Exam  Constitutional: He is oriented to person, place, and time. He appears well-developed. No distress.  HENT:  Head: Normocephalic and atraumatic.  Eyes: Pupils are equal, round, and reactive to light. Conjunctivae are normal.  Neck: Neck supple. No JVD present. No thyromegaly present.  Cardiovascular: Normal rate and regular rhythm.   Murmur heard. Pulmonary/Chest: Effort normal. No respiratory distress. He has no wheezes. He has rales. He exhibits no tenderness.  Abdominal: Soft. Bowel sounds are normal. He exhibits no distension. There is no tenderness.  Genitourinary:  Genitourinary Comments: Foley catheter in place with clear urine  Musculoskeletal: He exhibits edema.  Can move all 4 extremities, weakness to his legs, arthritis changes present, wide based gait and uses walker. 2+ pitting pedal edema  Lymphadenopathy:    He has no cervical adenopathy.  Neurological: He is alert and oriented  to person, place, and time.  Slow with conversation and does get confused about incidences  Skin: Skin is warm and dry. He is not diaphoretic. No erythema.  Easy bruising,   Psychiatric: He has a normal mood and affect.    Labs reviewed: Basic Metabolic Panel:  Recent Labs  03/11/16 0436  08/01/16 0022 08/15/16 09/17/16 1517  NA 139  < > 137 141 139  K 3.7  < > 4.1 3.7 4.1  CL 108  --  105  --  106  CO2 25  --  27  --  28  GLUCOSE 105*  --  104*  --  107*  BUN 18  < > 15 14 12   CREATININE 0.72  < > 1.00 0.9 0.79  CALCIUM 8.0*  --  8.5*  --  8.6  < > = values in this interval not displayed. Liver Function Tests:  Recent Labs  01/10/16 1400 03/10/16 1443 03/11/16 0436 04/03/16 05/08/16 08/15/16  AST 25 53*  46* 15 20 22   ALT 22 26 23 14 19 21   ALKPHOS 68 61 51 60 78 62  BILITOT 0.4 0.6 0.4  --   --   --   PROT 6.4* 6.9 5.8*  --   --   --   ALBUMIN 3.5 3.7 3.1*  --   --   --    No results for input(s): LIPASE, AMYLASE in the last 8760 hours. No results for input(s): AMMONIA in the last 8760 hours. CBC:  Recent Labs  03/10/16 1443  07/31/16 1359 08/01/16 0022 08/06/16 1641 08/15/16 09/17/16 1517  WBC 8.3  < > CANCELED 6.9 7.0 5.4  5.4 6.1  NEUTROABS 5.8  --  CANCELED  --   --   --  4.0  HGB 13.1  < > CANCELED 12.9* 14.3 12.5*  12.5* 13.4  HCT 39.5  < > CANCELED 38.3* 42.1 37*  37* 40.5  MCV 95.6  < > CANCELED 94.3 94  --  98.6  PLT 190  < > CANCELED 209 243 202 228.0  < > = values in this interval not displayed. Cardiac Enzymes:  Recent Labs  01/10/16 1904 01/10/16 2342 01/11/16 0537  TROPONINI <0.03 <0.03 <0.03   BNP: Invalid input(s): POCBNP Lab Results  Component Value Date   HGBA1C 5.6 12/07/2015   Lab Results  Component Value Date   TSH 1.53 09/17/2016   No results found for: VITAMINB12 No results found for: FOLATE No results found for: IRON, TIBC, FERRITIN  Imaging and Procedures obtained prior to SNF admission: Ct Abdomen Pelvis W  Contrast  Result Date: 08/01/2016 CLINICAL DATA:  82 y/o M; increasing lower abdominal pressure, chronic foley, abd distention. no nausea or vomiting. EXAM: CT ABDOMEN AND PELVIS WITH CONTRAST TECHNIQUE: Multidetector CT imaging of the abdomen and pelvis was performed using the standard protocol following bolus administration of intravenous contrast. CONTRAST:  100 cc Isovue-300 COMPARISON:  12/29/2014 CT of the abdomen and pelvis. FINDINGS: Lower chest: Large hiatal hernia containing the entirety of the stomach. Partially visualized pacemaker leads in right atrium and ventricle. Mitral valvular calcification. Hepatobiliary: No focal liver abnormality is seen. No gallstones, gallbladder wall thickening, or biliary dilatation. Pancreas: Unremarkable. No pancreatic ductal dilatation or surrounding inflammatory changes. Spleen: Normal in size without focal abnormality. Adrenals/Urinary Tract: Stable bilateral renal cysts the largest in the left lower pole measuring up to 4.9 cm. No urinary stone disease or hydronephrosis. The bladder is collapsed around the Foley catheter. Diffuse bladder wall thickening. Stomach/Bowel: No obstructive or inflammatory changes of the bowel. Large volume of stool throughout the colon. Vascular/Lymphatic: 14 mm penetrating ulcer directed posteriorly and inferiorly from the left lateral wall of infrarenal abdominal aorta (series 4, image 8). Severe calcific atherosclerosis of the abdominal aorta. Reproductive: Prostate is unremarkable. Other: No abdominal wall hernia or abnormality. No abdominopelvic ascites. Musculoskeletal: Advanced degenerative changes of the visible thoracic and lumbar spine. No acute osseous abnormality is identified. Mild bilateral hip osteoarthrosis. IMPRESSION: 1. Bladder collapsed around the Foley catheter. Bladder wall thickening more than expected for underdistention, this may represent cystitis or be due to prior hypertrophy. 2. Large volume of stool in the  colon, probably constipation. 3. Large hiatal hernia containing the entirety of stomach. 4. 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 Electronically Signed   By: Kristine Garbe M.D.   On: 08/01/2016 03:12   Chest X-Ray 2 Views 08/14/16 Impression. Cardiomegaly, chronic and postoperative changes as discussed. No evidence of acute pulmonary pathology  Echocardiogram 08/20/16 Moderate AS, severe LV systolic dysfunction with abnormal relaxation, EF 35%, right lead pacemaker in place   Assessment/Plan  1. Acute on chronic combined systolic and diastolic congestive heart failure (HCC) Has crackles and increased leg edema. Given his history of being hypotensive, change his demadex to 10 mg daily for now and reassess in 2 weeks. Not on any B Blocker or ARB/ACEI with his hypotension. F/u in 2 weeks. Record daily weight and BP at home. Wife agrees with plan. Reviewed with pt and his wife about need for transfer to ALF from [place] living given increased level of care.    I spent 30 minutes in total face-to-face time with the patient, more than 50% of which was spent in counseling and coordination of care, reviewing test results, reviewing medication and discussing or reviewing the diagnosis with patient.       Blanchie Serve, MD Internal Medicine [facility] [address] [place], [place] [address] Cell Phone (Monday-Friday 8 am - 5 pm): [phone] On Call: [phone] and follow prompts after 5 pm and on weekends Office Phone: [phone] Office Fax: [phone]

## 2016-09-23 ENCOUNTER — Encounter: Payer: Self-pay | Admitting: Internal Medicine

## 2016-09-30 ENCOUNTER — Encounter: Payer: Self-pay | Admitting: Internal Medicine

## 2016-10-01 ENCOUNTER — Telehealth: Payer: Self-pay | Admitting: Cardiovascular Disease

## 2016-10-01 NOTE — Telephone Encounter (Signed)
Spoke to [employer] group agent who stated they were able to obtain all the information they needed.  Routing to [place], [place] as Juluis Rainier.

## 2016-10-01 NOTE — Telephone Encounter (Signed)
[facility] [place] Phone: [phone] notified to call. She will call there or to pharmacy to verify

## 2016-10-01 NOTE — Telephone Encounter (Signed)
New message      Pt c/o medication issue:  1. Name of Medication:  northera 2. How are you currently taking this medication (dosage and times per day)?   3. Are you having a reaction (difficulty breathing--STAT)?   4. What is your medication issue?  They are trying to get info from pt to get medication approved but pt will not return their call.  She is calling to see if we can get in touch with pt and have him call this company

## 2016-10-02 ENCOUNTER — Non-Acute Institutional Stay: Payer: Medicare Other

## 2016-10-02 ENCOUNTER — Telehealth: Payer: Self-pay | Admitting: Internal Medicine

## 2016-10-02 VITALS — BP 110/70 | HR 70 | Temp 94.0°F | Ht 70.0 in | Wt 180.0 lb

## 2016-10-02 DIAGNOSIS — Z0000 Encounter for general adult medical examination without abnormal findings: Secondary | ICD-10-CM | POA: Diagnosis not present

## 2016-10-02 NOTE — Progress Notes (Signed)
Subjective:   Adam [name] is a 83 y.o. male who presents for an Initial Medicare Annual Wellness Visit at [facility]   Objective:    Today's Vitals   10/02/16 1449  BP: 110/70  Pulse: 70  Temp: (!) 94 F (34.4 C)  TempSrc: Oral  SpO2: 94%  Weight: 180 lb (81.6 kg)  Height: 5\' 10"  (1.778 m)   Body mass index is 25.83 kg/m.  Current Medications (verified) Outpatient Encounter Prescriptions as of 10/02/2016  Medication Sig  . acetaminophen (TYLENOL) 325 MG tablet Take 650 mg by mouth as needed.   Marland Kitchen amiodarone (PACERONE) 200 MG tablet Take 400 mg by mouth daily.  Marland Kitchen aspirin EC 81 MG tablet Take 1 tablet (81 mg total) by mouth daily.  . cetirizine (ZYRTEC) 10 MG tablet Take 10 mg by mouth daily. 1 hour before bedtime  . Cholecalciferol (VITAMIN D3) 5000 UNITS CAPS Take 5,000 Units by mouth every Monday.   . clopidogrel (PLAVIX) 75 MG tablet Take 1 tablet (75 mg total) by mouth daily.  . fludrocortisone (FLORINEF) 0.1 MG tablet One daily to help supporrt BP  . guaiFENesin (MUCINEX) 600 MG 12 hr tablet Take 600 mg by mouth daily.   Marland Kitchen levothyroxine (SYNTHROID, LEVOTHROID) 137 MCG tablet Take 1 tablet (137 mcg total) by mouth daily before breakfast.  . midodrine (PROAMATINE) 5 MG tablet Take 2 tablets (10 mg total) by mouth 3 (three) times daily with meals.  . pantoprazole (PROTONIX) 40 MG tablet Take 40 mg by mouth 2 (two) times daily.  . polyethylene glycol (MIRALAX / GLYCOLAX) packet Take 17 g by mouth 2 (two) times daily.  . pravastatin (PRAVACHOL) 40 MG tablet Take 40 mg by mouth at bedtime.  Marland Kitchen rOPINIRole (REQUIP) 0.5 MG tablet Take 0.5 mg by mouth daily as needed.   . torsemide (DEMADEX) 10 MG tablet Take 10 mg by mouth daily as needed.  . traZODone (DESYREL) 50 MG tablet Take 50 mg by mouth at bedtime. May give an additional 25mg  1 hour later if unable to sleep as needed.   No facility-administered encounter medications on file as of 10/02/2016.     Allergies  (verified) Altace [ramipril]; Crestor [rosuvastatin calcium]; Penicillins; and Sulfa antibiotics   History: Past Medical History:  Diagnosis Date  . Arthritis    "minor, back and sometimes knees" (12/15/2012)  . Bradycardia    AFib/SSS s/p St Jude PPM 04/12/2008  . CAD (coronary artery disease) 12/30/2014   CABG (LIMA-LAD, SVG-RCA, SVG-OM in 1996).  07/2009 BMS to SVG-RCA. Cath in 04/2010 with patent stents   . Cardiomyopathy, ischemic 08/25/2012  . CHF (congestive heart failure) ([place])   . Chronic knee pain 12/03/2014  . Combined congestive systolic and diastolic heart failure ([place]) 02/02/2015   Hx EF 41%. BNP 96.8 02/21/15 Torsemide 04/06/15 Na 142, K 4.6, Bun 16, creat 0.89 04/20/14 BNP 111.7, Na 142, K 4.6, Bun 16, creat 0.9   . Depression with anxiety 02/02/2015   02/21/15 Hgb A1c 5.8 03/10/15 MMSE 30/30   . Dizziness, after diuretic asscoiated with hypotension and responded to fluid bolus 06/05/2011   04/28/15 US carotid R+L normal bilateral arterial velocities.    Marland Kitchen Dyspnea 08/11/2014   Followed in Pulmonary clinic/ [facility] [place]  - 08/11/2014  Walked RA x 1 laps @ 185 ft each stopped due to fatigue/off balance/ slow pace/  no sob or desat  - PFT's  09/26/2014  FEV1 2.26 (85 % ) ratio 76  p  no % improvement from saba with DLCO  67 % corrects to 93 % for alv volume      Since prev study 08/04/13 minimal change lung vol or dlco    . Embolic cerebral infarction ([place]) 12/06/2015  . Exertional shortness of breath    "sometimes walking" (12/15/2012)  . GERD (gastroesophageal reflux disease)   . Gout 02/09/2015  . Heart murmur    "just told I had one today" (12/15/2012)  . Hiatal hernia   . Hyperlipidemia   . Hypertension   . Hypothyroid   . Influenza A 03/10/2016  . Insomnia   . Melanoma of back ([place]) 1976  . Myocardial infarction [facility]) 1996; 2011   "both silent" (12/15/2012)  . Nonrheumatic aortic valve stenosis   . Orthostatic hypotension   . Osteoporosis, senile   . Pacemaker   .  RBBB   . Restless leg 02/02/2015  . Right leg weakness 12/06/2015  . [facility] spotted fever   . S/P CABG x 4   . Sick sinus syndrome ([place]) 01/31/2014  . Sustained ventricular tachycardia ([place]) 07/27/2014  . Urinary retention 12/30/2014   Past Surgical History:  Procedure Laterality Date  . CARDIAC CATHETERIZATION  04/2010   LIMA to LAD patent,SVG to OM patent,no in-stnet restenosis RCA  . CATARACT EXTRACTION W/ INTRAOCULAR LENS  IMPLANT, BILATERAL Bilateral 2012  . CORONARY ANGIOPLASTY WITH STENT PLACEMENT  07/2009   bare metal stent to SVG to the RCA  . CORONARY ARTERY BYPASS GRAFT  1996   LIMA to LAD,SVG to RCA & SVG to OM  . INSERT / REPLACE / REMOVE PACEMAKER  2010  . MELANOMA EXCISION  05/1974 X2   "taken off my back" (12/15/2012)  . NM MYOVIEW LTD  06/2011   low risk  . TONSILLECTOMY  1938  . TRANSURETHRAL RESECTION OF PROSTATE  1986  . US ECHOCARDIOGRAPHY  07/11/2009   EF 45-50%   Family History  Problem Relation Age of Onset  . Coronary artery disease Mother   . Diabetes Mother   . Heart disease Mother   . Coronary artery disease Father   . Diabetes Father   . Lung cancer Father   . Heart disease Father   . Anuerysm Son   . Heart disease Brother    Social History   Occupational History  . Retired Company secretary -Pensions consultant    Social History Main Topics  . Smoking status: Never Smoker  . Smokeless tobacco: Never Used  . Alcohol use No  . Drug use: No  . Sexual activity: No   Tobacco Counseling Counseling given: Not Answered   Activities of Daily Living In your present state of health, do you have any difficulty performing the following activities: 10/02/2016 03/10/2016  Hearing? Y N  Vision? N N  Difficulty concentrating or making decisions? N N  Walking or climbing stairs? Y Y  Dressing or bathing? Y N  Doing errands, shopping? Y N  Preparing Food and eating ? Y -  Using the Toilet? Y -  In the past six months, have you accidently leaked  urine? Y -  Do you have problems with loss of bowel control? Y -  Managing your Medications? Y -  Managing your Finances? Y -  Housekeeping or managing your Housekeeping? Y -  Some recent data might be hidden    Immunizations and Health Maintenance Immunization History  Administered Date(s) Administered  . Influenza, High Dose Seasonal PF 11/01/2014  . Influenza-Unspecified 12/05/2013, 11/05/2014, 11/16/2015  .  PPD Test 03/07/2014  . Pneumococcal Conjugate-13 10/05/2009  . Pneumococcal Polysaccharide-23 11/05/2005  . Pneumococcal-Unspecified 10/05/2009  . Tdap 08/02/2014  . Zoster 11/05/2005   There are no preventive care reminders to display for this patient.  Patient Care Team: Blanchie Serve, MD as PCP - General (Internal Medicine) [name], MD as Attending Physician (Cardiology) Vevelyn Royals, MD as Consulting Physician (Ophthalmology) Franchot Gallo, MD as Consulting Physician (Urology) Tanda Rockers, MD as Consulting Physician (Pulmonary Disease) Allyn Kenner, MD (Dermatology) Deliah Goody, PA-C as Physician Assistant (Physician Assistant) Ngetich, Nelda Bucks, NP as Nurse Practitioner (Family Medicine)  Indicate any recent Medical Services you may have received from other than [facility] providers in the past year (date may be approximate).    Assessment:   This is a routine wellness examination for [place].   Hearing/Vision screen No exam data present  Dietary issues and exercise activities discussed: Current Exercise Habits: Structured exercise class, Type of exercise: Other - see comments;walking (physical therapy), Time (Minutes): 30, Frequency (Times/Week): 7, Weekly Exercise (Minutes/Week): 210, Intensity: Mild, Exercise limited by: None identified  Goals    . Increase water intake          Patient will increase water intake      Depression Screen PHQ 2/9 Scores 10/02/2016 09/05/2015 02/02/2015  PHQ - 2 Score 0 0 0    Fall Risk Fall Risk  10/02/2016  10/30/2015 09/05/2015 02/02/2015  Falls in the past year? No No No No  Risk for fall due to : - Impaired balance/gait - -    Cognitive Function: MMSE - Mini Mental State Exam 10/02/2016  Orientation to time 3  Orientation to Place 5  Registration 3  Attention/ Calculation 5  Recall 2  Language- name 2 objects 2  Language- repeat 1  Language- follow 3 step command 3  Language- read & follow direction 1  Write a sentence 1  Copy design 0  Total score 26        Screening Tests Health Maintenance  Topic Date Due  . INFLUENZA VACCINE  11/04/2016 (Originally 09/04/2016)  . PNA vac Low Risk Adult (2 of 2 - PCV13) 02/04/2017 (Originally 10/06/2010)  . TETANUS/TDAP  08/01/2026 (Originally 08/01/2024)  . DEXA SCAN  12/14/2024        Plan:  I have personally reviewed and addressed the Medicare Annual Wellness questionnaire and have noted the following in the patient's chart:  A. Medical and social history B. Use of alcohol, tobacco or illicit drugs  C. Current medications and supplements D. Functional ability and status E.  Nutritional status F.  Physical activity G. Advance directives H. List of other physicians I.  Hospitalizations, surgeries, and ER visits in previous 12 months J.  [place] to include hearing, vision, cognitive, depression L. Referrals and appointments - none  In addition, I have reviewed and discussed with patient certain preventive protocols, quality metrics, and best practice recommendations. A written personalized care plan for preventive services as well as general preventive health recommendations were provided to patient.  See attached scanned questionnaire for additional information.   Signed,   Rich Reining, RN Nurse Health Advisor   Quick Notes   Health Maintenance: Up to date     Abnormal Screen: MMSE 26/30. Did not pass clock drawing.     Patient Concerns: Pt spots on head starting about 6 months ago that are itchy.     Discuss bowels with patient-Miralax twice a day may be too much. Patient coughing still  an issue and doesn't think Zyrtec is helping. Needs hearing checked.  Requested Ropinirole old dose of 3x day PRN.    Nurse Concerns: None

## 2016-10-02 NOTE — Telephone Encounter (Signed)
I called to confirm the patient's appt at [facility] clinic today.  Pt's wife stated she received bill for lab work on 08/15/16 and doesn't remember that.  She wanted to confirm.  I didn't see a past lab appt on 08/15/16 so I explained that I will reach out to billing. VDM (DD)

## 2016-10-02 NOTE — Patient Instructions (Addendum)
Adam [name] , Thank you for taking time to come for your Medicare Wellness Visit. I appreciate your ongoing commitment to your health goals. Please review the following plan we discussed and let me know if I can assist you in the future.   Screening recommendations/referrals: Colonoscopy excluded, patient has aged out Recommended yearly ophthalmology/optometry visit for glaucoma screening and checkup Recommended yearly dental visit for hygiene and checkup  Vaccinations: Influenza vaccine due Pneumococcal vaccine up to date. Tdap vaccine up to date. Due 08/01/2024 Shingles vaccine due  Advanced directives: In Chart  Conditions/risks identified: None  Next appointment: Dr. Bubba Camp 10/09/2016 @ 11:30am  Preventive Care 65 Years and Older, Male Preventive care refers to lifestyle choices and visits with your health care provider that can promote health and wellness. What does preventive care include?  A yearly physical exam. This is also called an annual well check.  Dental exams once or twice a year.  Routine eye exams. Ask your health care provider how often you should have your eyes checked.  Personal lifestyle choices, including:  Daily care of your teeth and gums.  Regular physical activity.  Eating a healthy diet.  Avoiding tobacco and drug use.  Limiting alcohol use.  Practicing safe sex.  Taking low doses of aspirin every day.  Taking vitamin and mineral supplements as recommended by your health care provider. What happens during an annual well check? The services and screenings done by your health care provider during your annual well check will depend on your age, overall health, lifestyle risk factors, and family history of disease. Counseling  Your health care provider may ask you questions about your:  Alcohol use.  Tobacco use.  Drug use.  Emotional well-being.  Home and relationship well-being.  Sexual activity.  Eating habits.  History of  falls.  Memory and ability to understand (cognition).  Work and work Statistician. Screening  You may have the following tests or measurements:  Height, weight, and BMI.  Blood pressure.  Lipid and cholesterol levels. These may be checked every 5 years, or more frequently if you are over 4 years old.  Skin check.  Lung cancer screening. You may have this screening every year starting at age 25 if you have a 30-pack-year history of smoking and currently smoke or have quit within the past 15 years.  Fecal occult blood test (FOBT) of the stool. You may have this test every year starting at age 66.  Flexible sigmoidoscopy or colonoscopy. You may have a sigmoidoscopy every 5 years or a colonoscopy every 10 years starting at age 46.  Prostate cancer screening. Recommendations will vary depending on your family history and other risks.  Hepatitis C blood test.  Hepatitis B blood test.  Sexually transmitted disease (STD) testing.  Diabetes screening. This is done by checking your blood sugar (glucose) after you have not eaten for a while (fasting). You may have this done every 1-3 years.  Abdominal aortic aneurysm (AAA) screening. You may need this if you are a current or former smoker.  Osteoporosis. You may be screened starting at age 78 if you are at high risk. Talk with your health care provider about your test results, treatment options, and if necessary, the need for more tests. Vaccines  Your health care provider may recommend certain vaccines, such as:  Influenza vaccine. This is recommended every year.  Tetanus, diphtheria, and acellular pertussis (Tdap, Td) vaccine. You may need a Td booster every 10 years.  Zoster vaccine. You may  need this after age 1.  Pneumococcal 13-valent conjugate (PCV13) vaccine. One dose is recommended after age 38.  Pneumococcal polysaccharide (PPSV23) vaccine. One dose is recommended after age 26. Talk to your health care provider about  which screenings and vaccines you need and how often you need them. This information is not intended to replace advice given to you by your health care provider. Make sure you discuss any questions you have with your health care provider. Document Released: 02/17/2015 Document Revised: 10/11/2015 Document Reviewed: 11/22/2014 Elsevier Interactive Patient Education  2017 [place] Prevention in the Home Falls can cause injuries. They can happen to people of all ages. There are many things you can do to make your home safe and to help prevent falls. What can I do on the outside of my home?  Regularly fix the edges of walkways and driveways and fix any cracks.  Remove anything that might make you trip as you walk through a door, such as a raised step or threshold.  Trim any bushes or trees on the path to your home.  Use bright outdoor lighting.  Clear any walking paths of anything that might make someone trip, such as rocks or tools.  Regularly check to see if handrails are loose or broken. Make sure that both sides of any steps have handrails.  Any raised decks and porches should have guardrails on the edges.  Have any leaves, snow, or ice cleared regularly.  Use sand or salt on walking paths during winter.  Clean up any spills in your garage right away. This includes oil or grease spills. What can I do in the bathroom?  Use night lights.  Install grab bars by the toilet and in the tub and shower. Do not use towel bars as grab bars.  Use non-skid mats or decals in the tub or shower.  If you need to sit down in the shower, use a plastic, non-slip stool.  Keep the floor dry. Clean up any water that spills on the floor as soon as it happens.  Remove soap buildup in the tub or shower regularly.  Attach bath mats securely with double-sided non-slip rug tape.  Do not have throw rugs and other things on the floor that can make you trip. What can I do in the  bedroom?  Use night lights.  Make sure that you have a light by your bed that is easy to reach.  Do not use any sheets or blankets that are too big for your bed. They should not hang down onto the floor.  Have a firm chair that has side arms. You can use this for support while you get dressed.  Do not have throw rugs and other things on the floor that can make you trip. What can I do in the kitchen?  Clean up any spills right away.  Avoid walking on wet floors.  Keep items that you use a lot in easy-to-reach places.  If you need to reach something above you, use a strong step stool that has a grab bar.  Keep electrical cords out of the way.  Do not use floor polish or wax that makes floors slippery. If you must use wax, use non-skid floor wax.  Do not have throw rugs and other things on the floor that can make you trip. What can I do with my stairs?  Do not leave any items on the stairs.  Make sure that there are handrails on both sides  of the stairs and use them. Fix handrails that are broken or loose. Make sure that handrails are as long as the stairways.  Check any carpeting to make sure that it is firmly attached to the stairs. Fix any carpet that is loose or worn.  Avoid having throw rugs at the top or bottom of the stairs. If you do have throw rugs, attach them to the floor with carpet tape.  Make sure that you have a light switch at the top of the stairs and the bottom of the stairs. If you do not have them, ask someone to add them for you. What else can I do to help prevent falls?  Wear shoes that:  Do not have high heels.  Have rubber bottoms.  Are comfortable and fit you well.  Are closed at the toe. Do not wear sandals.  If you use a stepladder:  Make sure that it is fully opened. Do not climb a closed stepladder.  Make sure that both sides of the stepladder are locked into place.  Ask someone to hold it for you, if possible.  Clearly mark and make  sure that you can see:  Any grab bars or handrails.  First and last steps.  Where the edge of each step is.  Use tools that help you move around (mobility aids) if they are needed. These include:  Canes.  Walkers.  Scooters.  Crutches.  Turn on the lights when you go into a dark area. Replace any light bulbs as soon as they burn out.  Set up your furniture so you have a clear path. Avoid moving your furniture around.  If any of your floors are uneven, fix them.  If there are any pets around you, be aware of where they are.  Review your medicines with your doctor. Some medicines can make you feel dizzy. This can increase your chance of falling. Ask your doctor what other things that you can do to help prevent falls. This information is not intended to replace advice given to you by your health care provider. Make sure you discuss any questions you have with your health care provider. Document Released: 11/17/2008 Document Revised: 06/29/2015 Document Reviewed: 02/25/2014 Elsevier Interactive Patient Education  2017 [employer].

## 2016-10-09 ENCOUNTER — Non-Acute Institutional Stay: Payer: Medicare Other | Admitting: Internal Medicine

## 2016-10-09 ENCOUNTER — Encounter: Payer: Self-pay | Admitting: Internal Medicine

## 2016-10-09 ENCOUNTER — Encounter: Payer: Medicare Other | Admitting: Internal Medicine

## 2016-10-09 VITALS — BP 114/68 | HR 70 | Temp 98.5°F | Resp 16 | Ht 70.0 in | Wt 182.2 lb

## 2016-10-09 DIAGNOSIS — G2581 Restless legs syndrome: Secondary | ICD-10-CM | POA: Diagnosis not present

## 2016-10-09 DIAGNOSIS — R195 Other fecal abnormalities: Secondary | ICD-10-CM | POA: Diagnosis not present

## 2016-10-09 DIAGNOSIS — I951 Orthostatic hypotension: Secondary | ICD-10-CM

## 2016-10-09 DIAGNOSIS — I5042 Chronic combined systolic (congestive) and diastolic (congestive) heart failure: Secondary | ICD-10-CM

## 2016-10-09 DIAGNOSIS — R42 Dizziness and giddiness: Secondary | ICD-10-CM | POA: Diagnosis not present

## 2016-10-09 MED ORDER — PSYLLIUM 28 % PO PACK: 1.0000 | PACK | Freq: Every day | ORAL | 2 refills | Status: DC

## 2016-10-09 NOTE — Progress Notes (Signed)
Provider:  Blanchie Serve MD  Location:  [place] of Service:  Clinic (12)  PCP: Blanchie Serve, MD Patient Care Team: Blanchie Serve, MD as PCP - General (Internal Medicine) Croitoru, Dani Gobble, MD as Attending Physician (Cardiology) Vevelyn Royals, MD as Consulting Physician (Ophthalmology) Franchot Gallo, MD as Consulting Physician (Urology) Tanda Rockers, MD as Consulting Physician (Pulmonary Disease) Allyn Kenner, MD (Dermatology) Deliah Goody, PA-C as Physician Assistant (Physician Assistant) Ngetich, Nelda Bucks, NP as Nurse Practitioner (Family Medicine)  Extended Emergency Contact Information Primary Emergency Contact: Beswick,[name] Address: [address]          [place] Phone: [phone] Mobile Phone: [phone] Relation: Spouse Secondary Emergency Contact: [name],Bohnenkamp  [place] Phone: [phone] Mobile Phone: [phone] Relation: Daughter  Code Status: Full Code  Goals of Care: Advanced Directive information Advanced Directives 10/02/2016  Does Patient Have a Medical Advance Directive? Yes  Type of Advance Directive Living will;Healthcare Power of Attorney  Does patient want to make changes to medical advance directive? No - Patient declined  Copy of [place] in Chart? Yes  Would patient like information on creating a medical advance directive? -  Pre-existing out of facility DNR order (yellow form or pink MOST form) -      Chief Complaint  Patient presents with  . Acute Visit    chf follow up, loose stool, restless legs, dizzy  . Medication Refill    No refills needed at this time.     HPI: Patient is a 83 y.o. male seen today for follow up visit. Daughter and wife present this visit.   Restless leg- he has been having restless leg syndrome and this has recently worsened. He is currently on requip 0.5 mg daily. At night, he is waking up with  RLS.  Chronic systolic CHF- complaints of dyspnea with exertion and needs to take break in between. On demadex prn, has not required it recently. On weight review from home has had weight fluctuations of 3-5 lbs.   Loose stool- occasional dark stool per wife. Not taking his miralax at present. Seen by Dr Benson Norway 2 months back. Off amitiza.   Dizziness- with autonomic neuropathy with orthostasis. No fall reported. Currently on midodrine 10 mg tid. Uses his walker. Also on florinef.    Past Medical History:  Diagnosis Date  . Arthritis    "minor, back and sometimes knees" (12/15/2012)  . Bradycardia    AFib/SSS s/p St Jude PPM 04/12/2008  . CAD (coronary artery disease) 12/30/2014   CABG (LIMA-LAD, SVG-RCA, SVG-OM in 1996).  07/2009 BMS to SVG-RCA. Cath in 04/2010 with patent stents   . Cardiomyopathy, ischemic 08/25/2012  . CHF (congestive heart failure) ([place])   . Chronic knee pain 12/03/2014  . Combined congestive systolic and diastolic heart failure ([place]) 02/02/2015   Hx EF 41%. BNP 96.8 02/21/15 Torsemide 04/06/15 Na 142, K 4.6, Bun 16, creat 0.89 04/20/14 BNP 111.7, Na 142, K 4.6, Bun 16, creat 0.9   . Depression with anxiety 02/02/2015   02/21/15 Hgb A1c 5.8 03/10/15 MMSE 30/30   . Dizziness, after diuretic asscoiated with hypotension and responded to fluid bolus 06/05/2011   04/28/15 US carotid R+L normal bilateral arterial velocities.    Marland Kitchen Dyspnea 08/11/2014   Followed in Pulmonary clinic/ [facility]/ [place]  - 08/11/2014  Walked RA x 1 laps @ 185 ft each stopped due to fatigue/off balance/ slow pace/  no sob or desat  - PFT's  09/26/2014  FEV1 2.26 (85 % ) ratio 76  p no % improvement from saba with DLCO  67 % corrects to 93 % for alv volume      Since prev study 08/04/13 minimal change lung vol or dlco    . Embolic cerebral infarction ([place]) 12/06/2015  . Exertional shortness of breath    "sometimes walking" (12/15/2012)  . GERD (gastroesophageal reflux disease)   . Gout 02/09/2015  . Heart murmur     "just told I had one today" (12/15/2012)  . Hiatal hernia   . Hyperlipidemia   . Hypertension   . Hypothyroid   . Influenza A 03/10/2016  . Insomnia   . Melanoma of back ([place]) 1976  . Myocardial infarction [facility]) 1996; 2011   "both silent" (12/15/2012)  . Nonrheumatic aortic valve stenosis   . Orthostatic hypotension   . Osteoporosis, senile   . Pacemaker   . RBBB   . Restless leg 02/02/2015  . Right leg weakness 12/06/2015  . [facility] spotted fever   . S/P CABG x 4   . Sick sinus syndrome ([place]) 01/31/2014  . Sustained ventricular tachycardia ([place]) 07/27/2014  . Urinary retention 12/30/2014   Past Surgical History:  Procedure Laterality Date  . CARDIAC CATHETERIZATION  04/2010   LIMA to LAD patent,SVG to OM patent,no in-stnet restenosis RCA  . CATARACT EXTRACTION W/ INTRAOCULAR LENS  IMPLANT, BILATERAL Bilateral 2012  . CORONARY ANGIOPLASTY WITH STENT PLACEMENT  07/2009   bare metal stent to SVG to the RCA  . CORONARY ARTERY BYPASS GRAFT  1996   LIMA to LAD,SVG to RCA & SVG to OM  . INSERT / REPLACE / REMOVE PACEMAKER  2010  . MELANOMA EXCISION  05/1974 X2   "taken off my back" (12/15/2012)  . NM MYOVIEW LTD  06/2011   low risk  . TONSILLECTOMY  1938  . TRANSURETHRAL RESECTION OF PROSTATE  1986  . US ECHOCARDIOGRAPHY  07/11/2009   EF 45-50%    reports that he has never smoked. He has never used smokeless tobacco. He reports that he does not drink alcohol or use drugs. Social History   Social History  . Marital status: Married    Spouse name: N/A  . Number of children: 2  . Years of education: Masters   Occupational History  . Retired Company secretary -Pensions consultant    Social History Main Topics  . Smoking status: Never Smoker  . Smokeless tobacco: Never Used  . Alcohol use No  . Drug use: No  . Sexual activity: No   Other Topics Concern  . Not on file   Social History Narrative   Lives at [place] to [place] 01/09/15   Married - Violet    Never smoked   Alcohol none   Previously employed as Scientist, forensic for [employer].         Diet:Low sodium   Do you drink/eat things with caffeine? No   Marital status: Married                              What year were you married?1950   Do you live in a house, apartment, assisted living, condo, trailer, etc)?    Is it one or more stories? 1   How many persons live in your home? 2   Do you have any pets in your home? No  Current or past profession: Minister, [employer]   Do you exercise?     Very Little                                                 Type & how often:    Do you have a living will?  Yes   Do you have a DNR Form? Yes   Do you have a POA/HPOA forms? Yes    Functional Status Survey:    Family History  Problem Relation Age of Onset  . Coronary artery disease Mother   . Diabetes Mother   . Heart disease Mother   . Coronary artery disease Father   . Diabetes Father   . Lung cancer Father   . Heart disease Father   . Anuerysm Son   . Heart disease Brother     Health Maintenance  Topic Date Due  . INFLUENZA VACCINE  11/04/2016 (Originally 09/04/2016)  . PNA vac Low Risk Adult (2 of 2 - PCV13) 02/04/2017 (Originally 10/06/2010)  . TETANUS/TDAP  08/01/2026 (Originally 08/01/2024)  . DEXA SCAN  12/14/2024    Allergies  Allergen Reactions  . Altace [Ramipril] Cough  . Crestor [Rosuvastatin Calcium] Rash  . Penicillins Rash and Other (See Comments)    Has patient had a PCN reaction causing immediate rash, facial/tongue/throat swelling, SOB or lightheadedness with hypotension: No Has patient had a PCN reaction causing severe rash involving mucus membranes or skin necrosis: Yes Has patient had a PCN reaction that required hospitalization Yes Has patient had a PCN reaction occurring within the last 10 years: No If all of the above answers are "NO", then may proceed with Cephalosporin use.   . Sulfa Antibiotics Rash    Outpatient Encounter  Prescriptions as of 10/09/2016  Medication Sig  . acetaminophen (TYLENOL) 325 MG tablet Take 650 mg by mouth as needed.   Marland Kitchen amiodarone (PACERONE) 200 MG tablet Take 400 mg by mouth daily.  Marland Kitchen aspirin EC 81 MG tablet Take 1 tablet (81 mg total) by mouth daily.  . cetirizine (ZYRTEC) 10 MG tablet Take 10 mg by mouth daily. 1 hour before bedtime  . Cholecalciferol (VITAMIN D3) 5000 UNITS CAPS Take 5,000 Units by mouth every Monday.   . clopidogrel (PLAVIX) 75 MG tablet Take 1 tablet (75 mg total) by mouth daily.  . fludrocortisone (FLORINEF) 0.1 MG tablet One daily to help supporrt BP  . guaiFENesin (MUCINEX) 600 MG 12 hr tablet Take 600 mg by mouth 2 (two) times daily.   Marland Kitchen levothyroxine (SYNTHROID, LEVOTHROID) 137 MCG tablet Take 1 tablet (137 mcg total) by mouth daily before breakfast.  . midodrine (PROAMATINE) 5 MG tablet Take 2 tablets (10 mg total) by mouth 3 (three) times daily with meals.  . pantoprazole (PROTONIX) 40 MG tablet Take 40 mg by mouth 2 (two) times daily.  . pravastatin (PRAVACHOL) 40 MG tablet Take 40 mg by mouth at bedtime.  Marland Kitchen rOPINIRole (REQUIP) 0.5 MG tablet Take 0.5 mg by mouth daily.   Marland Kitchen torsemide (DEMADEX) 10 MG tablet Take 10 mg by mouth as needed.   . traZODone (DESYREL) 50 MG tablet Take 50 mg by mouth at bedtime. May give an additional 25mg  1 hour later if unable to sleep as needed.  . [DISCONTINUED] polyethylene glycol (MIRALAX / GLYCOLAX) packet Take 17 g by mouth as needed.  No facility-administered encounter medications on file as of 10/09/2016.     Review of Systems  Constitutional: Negative for appetite change, chills and fatigue.       Improved energy level.   HENT: Positive for hearing loss.   Eyes: Negative for visual disturbance.  Respiratory: Positive for shortness of breath. Negative for cough and wheezing.        Mostly dry cough, denies worsening of cough. Has ongoing dyspnea, no recent worsening  Cardiovascular: Positive for leg swelling. Negative  for chest pain and palpitations.  Gastrointestinal: Positive for diarrhea. Negative for abdominal pain, blood in stool, nausea, rectal pain and vomiting.       Melena present  Genitourinary: Negative for dysuria.       Has indwelling foley catheter for urinary retention  Musculoskeletal: Positive for gait problem. Negative for back pain.       No falls  Neurological: Positive for dizziness, tremors and weakness. Negative for seizures, syncope, numbness and headaches.       Dizziness with position change, using walker with assistance to get around, improved strength      Vitals:   10/09/16 1147  BP: 114/68  Pulse: 70  Resp: 16  Temp: 98.5 F (36.9 C)  TempSrc: Oral  SpO2: 99%  Weight: 182 lb 3.2 oz (82.6 kg)  Height: 5\' 10"  (1.778 m)   Wt Readings from Last 3 Encounters:  10/09/16 182 lb 3.2 oz (82.6 kg)  10/02/16 180 lb (81.6 kg)  09/18/16 180 lb 12.8 oz (82 kg)   Body mass index is 26.14 kg/m. Physical Exam  Constitutional: He is oriented to person, place, and time. He appears well-developed. No distress.  HENT:  Head: Normocephalic and atraumatic.  Eyes: Pupils are equal, round, and reactive to light. Conjunctivae are normal.  Neck: Neck supple. No JVD present. No thyromegaly present.  Cardiovascular: Normal rate and regular rhythm.   Murmur heard. Pulmonary/Chest: Effort normal. No respiratory distress. He has no wheezes. He has rales. He exhibits no tenderness.  Abdominal: Soft. Bowel sounds are normal. He exhibits no distension. There is no tenderness.  Genitourinary: Rectal exam shows guaiac negative stool.  Genitourinary Comments: Foley catheter in place with clear urine  Musculoskeletal: He exhibits edema.  Can move all 4 extremities, weakness to his legs, arthritis changes present, wide based gait and uses walker. 2+ pitting pedal edema  Lymphadenopathy:    He has no cervical adenopathy.  Neurological: He is alert and oriented to person, place, and time.  Slow  with conversation and does get confused about incidences  Skin: Skin is warm and dry. He is not diaphoretic. No erythema.  Easy bruising,   Psychiatric: He has a normal mood and affect.    Labs reviewed: Basic Metabolic Panel:  Recent Labs  03/11/16 0436  08/01/16 0022 08/15/16 09/17/16 1517  NA 139  < > 137 141 139  K 3.7  < > 4.1 3.7 4.1  CL 108  --  105  --  106  CO2 25  --  27  --  28  GLUCOSE 105*  --  104*  --  107*  BUN 18  < > 15 14 12   CREATININE 0.72  < > 1.00 0.9 0.79  CALCIUM 8.0*  --  8.5*  --  8.6  < > = values in this interval not displayed. Liver Function Tests:  Recent Labs  01/10/16 1400 03/10/16 1443 03/11/16 0436 04/03/16 05/08/16 08/15/16  AST 25 53* 46* 15 20 22  ALT 22 26 23 14 19 21   ALKPHOS 68 61 51 60 78 62  BILITOT 0.4 0.6 0.4  --   --   --   PROT 6.4* 6.9 5.8*  --   --   --   ALBUMIN 3.5 3.7 3.1*  --   --   --    No results for input(s): LIPASE, AMYLASE in the last 8760 hours. No results for input(s): AMMONIA in the last 8760 hours. CBC:  Recent Labs  03/10/16 1443  07/31/16 1359 08/01/16 0022 08/06/16 1641 08/15/16 09/17/16 1517  WBC 8.3  < > CANCELED 6.9 7.0 5.4  5.4 6.1  NEUTROABS 5.8  --  CANCELED  --   --   --  4.0  HGB 13.1  < > CANCELED 12.9* 14.3 12.5*  12.5* 13.4  HCT 39.5  < > CANCELED 38.3* 42.1 37*  37* 40.5  MCV 95.6  < > CANCELED 94.3 94  --  98.6  PLT 190  < > CANCELED 209 243 202 228.0  < > = values in this interval not displayed. Cardiac Enzymes:  Recent Labs  01/10/16 1904 01/10/16 2342 01/11/16 0537  TROPONINI <0.03 <0.03 <0.03   BNP: Invalid input(s): POCBNP Lab Results  Component Value Date   HGBA1C 5.6 12/07/2015   Lab Results  Component Value Date   TSH 1.53 09/17/2016   No results found for: VITAMINB12 No results found for: FOLATE No results found for: IRON, TIBC, FERRITIN  Imaging and Procedures obtained prior to SNF admission: Ct Abdomen Pelvis W Contrast  Result Date:  08/01/2016 CLINICAL DATA:  83 y/o M; increasing lower abdominal pressure, chronic foley, abd distention. no nausea or vomiting. EXAM: CT ABDOMEN AND PELVIS WITH CONTRAST TECHNIQUE: Multidetector CT imaging of the abdomen and pelvis was performed using the standard protocol following bolus administration of intravenous contrast. CONTRAST:  100 cc Isovue-300 COMPARISON:  12/29/2014 CT of the abdomen and pelvis. FINDINGS: Lower chest: Large hiatal hernia containing the entirety of the stomach. Partially visualized pacemaker leads in right atrium and ventricle. Mitral valvular calcification. Hepatobiliary: No focal liver abnormality is seen. No gallstones, gallbladder wall thickening, or biliary dilatation. Pancreas: Unremarkable. No pancreatic ductal dilatation or surrounding inflammatory changes. Spleen: Normal in size without focal abnormality. Adrenals/Urinary Tract: Stable bilateral renal cysts the largest in the left lower pole measuring up to 4.9 cm. No urinary stone disease or hydronephrosis. The bladder is collapsed around the Foley catheter. Diffuse bladder wall thickening. Stomach/Bowel: No obstructive or inflammatory changes of the bowel. Large volume of stool throughout the colon. Vascular/Lymphatic: 14 mm penetrating ulcer directed posteriorly and inferiorly from the left lateral wall of infrarenal abdominal aorta (series 4, image 8). Severe calcific atherosclerosis of the abdominal aorta. Reproductive: Prostate is unremarkable. Other: No abdominal wall hernia or abnormality. No abdominopelvic ascites. Musculoskeletal: Advanced degenerative changes of the visible thoracic and lumbar spine. No acute osseous abnormality is identified. Mild bilateral hip osteoarthrosis. IMPRESSION: 1. Bladder collapsed around the Foley catheter. Bladder wall thickening more than expected for underdistention, this may represent cystitis or be due to prior hypertrophy. 2. Large volume of stool in the colon, probably  constipation. 3. Large hiatal hernia containing the entirety of stomach. 4. 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 Electronically Signed   By: Kristine Garbe M.D.   On: 08/01/2016 03:12   Chest X-Ray 2 Views 08/14/16 Impression. Cardiomegaly, chronic and postoperative changes as discussed. No evidence of acute pulmonary pathology  Echocardiogram 08/20/16 Moderate AS,  severe LV systolic dysfunction with abnormal relaxation, EF 35%, right lead pacemaker in place   Assessment/Plan  1. Loose stools Chronic, wife was concerned about black stools. Guaiac test negative for blood. Start metamucil to help with bulking his stool. Follow up to be made with GI  2. Orthostatic hypotension Improved BP reading. No fall. Has dizziness. Slow position change encouraged. Continue midodrine and florinef.   3. RLS (restless legs syndrome) Pt was taking requip tid before having hypotensive episodes. Currently on requip daily at bedtime. Advised to take it at 8 pm. Will avoid increasing dosing until needed with concern for hypotension. Check ferritin level.   4. Dizzy With autonomic neuropathy with orthostasis. Fall precautions and slow position change.   5. Chronic combined systolic and diastolic congestive heart failure ([place]) With his weight gain and rales on exam, advised to take demadex every other day x 2 weeks and continue daily weight and BP check. If BP drops, will continue this for a week only. Daughter agrees with care plan and will be keeping a check on this.   Spent more than 40 minutes this visit with >50% spent on care co-ordination and counselling for patient, answered questions from patient and family.    Blanchie Serve, MD Internal Medicine [facility] [address] [place], [place] [address] Cell Phone (Monday-Friday 8 am - 5 pm): [phone] On Call: [phone] and follow prompts after 5 pm and on weekends Office Phone:  [phone] Office Fax: [phone]

## 2016-10-14 LAB — FERRITIN: Ferritin: 88 ng/mL (ref 20–380)

## 2016-10-16 ENCOUNTER — Encounter: Payer: Self-pay | Admitting: Internal Medicine

## 2016-10-16 ENCOUNTER — Encounter: Payer: Self-pay | Admitting: *Deleted

## 2016-10-16 ENCOUNTER — Ambulatory Visit (INDEPENDENT_AMBULATORY_CARE_PROVIDER_SITE_OTHER): Payer: Medicare Other | Admitting: Internal Medicine

## 2016-10-16 VITALS — BP 108/66 | HR 70 | Temp 97.5°F | Ht 70.0 in | Wt 171.0 lb

## 2016-10-16 DIAGNOSIS — R05 Cough: Secondary | ICD-10-CM | POA: Diagnosis not present

## 2016-10-16 DIAGNOSIS — R0609 Other forms of dyspnea: Secondary | ICD-10-CM | POA: Diagnosis not present

## 2016-10-16 DIAGNOSIS — R058 Other specified cough: Secondary | ICD-10-CM

## 2016-10-16 DIAGNOSIS — R0600 Dyspnea, unspecified: Secondary | ICD-10-CM

## 2016-10-16 MED ORDER — CEFDINIR 300 MG PO CAPS: 300.0000 mg | ORAL_CAPSULE | Freq: Two times a day (BID) | ORAL | 0 refills | Status: DC

## 2016-10-16 MED ORDER — PREDNISONE 10 MG PO TABS: ORAL_TABLET | ORAL | 0 refills | Status: DC

## 2016-10-16 NOTE — Patient Instructions (Addendum)
Be sure pantoprazole (protonix) is Take 30- 60 min before your first and last meals of the day   omnicef 300 mg twice daily x 10 days   Prednisone 10 mg take  4 each am x 2 days,   2 each am x 2 days,  1 each am x 2 days and stop   Please schedule a follow up office visit in 6 weeks, call sooner if needed with cxr

## 2016-10-16 NOTE — Progress Notes (Signed)
Subjective:    Patient ID: Adam [name], male    DOB: 11/28/1926,     MRN: [id number]    Brief patient profile:  21 yowm never smoker new onset cough summer of 2015 with some bloody mucus/ turned yellow p  zpak esp in am's then bloody again x end of May  2016 > [name] referred 07/14/2014 to pulmonary clinic    History of Present Illness  07/14/2014 1st [name]/ Adam [name]   Chief Complaint  Patient presents with  . Pulmonary Consult    Referred by [name].  Pt c/o cough for the past year or so- prod in the am with yellow sputum. He has had blood streaked sputum for the past 3 wks.  He feels weak in general and voice has changed. He also has had night sweats.   pt has baseline gerd / large HH on ppi with bfast daily but most severe coughing is before bfast, not really much worse x one year but now blood streaked- no epistaxis/ on asa and plavix  rec Please see patient coordinator before you leave today  to schedule sinus CT > neg  Pantoprazole (protonix) 40 mg   Take  30-60 min before first meal of the day and Pepcid ac (famotidine)  20 mg one @  bedtime until return to office - this is the best way to tell whether stomach acid is contributing to your problem.   GERD diet     07/26/14 started on Amiodarone     09/17/2016  f/u ov/Adam [name] re: worse cough / sob no 02/ still on amiodarone  And referred back to [facility] clinic by [name] who noted crackles on exam  Chief Complaint  Patient presents with  . Acute Visit    pt c/o worsening sometimes prod cough with yellow mucus X1 year.  Does note some chest tightness.    cough is mostly hs but also daytime not related to eating or outdoors - some assoc nasal congestion  Doe x 50 fts worse x 1 years also  On florinef for orthostatic symptoms with leg swelling  Having to sleep in recliner due to cough x 1 year,  45 degrees helps  rec Make sure you take your protonix 40 mg Take 30- 60 min before your first and last  meals of the day  Add zyrtec 10 mg at least  One hour before bedtime   GERD eit   Please remember to go to the lab and x-ray department downstairs in the basement  for your tests - we will call you with the results when they are available. Please schedule a follow up office visit in 4 weeks, sooner if needed  Rec: double dose of demadex    10/16/2016  f/u ov/Adam [name] re:  Sob/cough ? Amiodarone toxcity vs chf /  Chief Complaint  Patient presents with  . Follow-up    Cough and SOB have improved some, but have not resolved. His cough is more prod and sputum is darker in color, brown. He has also noticed night sweats off and off since the last visit.   some nasal and chest congestion x sev week/with purulent discharge and same changes in suputum/ pcn allergy= rash Able to lie down on one pillow comfortably Leg swelling better on higher doses of demadex and breathing seemed to improve as welleing down   No obvious day to day or daytime variability or assoc mucus plugs or hemoptysis or cp or  chest tightness, subjective wheeze or overt sinus or hb symptoms. No unusual exp hx or h/o childhood pna/ asthma or knowledge of premature birth.  Sleeping ok 1 pillow  without nocturnal  or early am exacerbation  of respiratory  c/o's or need for noct saba. Also denies any obvious fluctuation of symptoms with weather or environmental changes or other aggravating or alleviating factors except as outlined above   Current Allergies, Complete Past Medical History, Past Surgical History, Family History, and Social History were reviewed in [employer] record.  ROS  The following are not active complaints unless bolded sore throat, dysphagia, dental problems, itching, sneezing,  nasal congestion or disharge of excess mucus or purulent secretions, ear ache,   fever, chills, sweats, unintended wt loss or wt gain, classically pleuritic or exertional cp,  orthopnea pnd or leg swelling, presyncope,  palpitations, abdominal pain, anorexia, nausea, vomiting, diarrhea  or change in bowel habits or bladder habits, change in stools or change in urine, dysuria, hematuria,  rash, arthralgias, visual complaints, headache, numbness, weakness or ataxia or problems with walking or coordination,  change in mood/affect or memory.        Current Meds  Medication Sig  . acetaminophen (TYLENOL) 325 MG tablet Take 650 mg by mouth as needed.   Marland Kitchen amiodarone (PACERONE) 200 MG tablet Take 400 mg by mouth daily.  Marland Kitchen aspirin EC 81 MG tablet Take 1 tablet (81 mg total) by mouth daily.  . cetirizine (ZYRTEC) 10 MG tablet Take 10 mg by mouth daily. 1 hour before bedtime  . Cholecalciferol (VITAMIN D3) 5000 UNITS CAPS Take 5,000 Units by mouth every Monday.   . clopidogrel (PLAVIX) 75 MG tablet Take 1 tablet (75 mg total) by mouth daily.  . fludrocortisone (FLORINEF) 0.1 MG tablet One daily to help supporrt BP  . guaiFENesin (MUCINEX) 600 MG 12 hr tablet Take 600 mg by mouth 2 (two) times daily.   Marland Kitchen levothyroxine (SYNTHROID, LEVOTHROID) 137 MCG tablet Take 1 tablet (137 mcg total) by mouth daily before breakfast.  . midodrine (PROAMATINE) 5 MG tablet Take 2 tablets (10 mg total) by mouth 3 (three) times daily with meals.  . pantoprazole (PROTONIX) 40 MG tablet Take 40 mg by mouth 2 (two) times daily.  . pravastatin (PRAVACHOL) 40 MG tablet Take 40 mg by mouth at bedtime.  . psyllium (METAMUCIL SMOOTH TEXTURE) 28 % packet Take 1 packet by mouth daily.  Marland Kitchen rOPINIRole (REQUIP) 0.5 MG tablet Take 0.5 mg by mouth daily.   Marland Kitchen torsemide (DEMADEX) 10 MG tablet Take 10 mg by mouth every other day.   . traZODone (DESYREL) 50 MG tablet Take 50 mg by mouth at bedtime. May give an additional 25mg  1 hour later if unable to sleep as needed.             Objective:   Physical Exam   amb wm nad / still quite hoarse   Using rollator    08/11/2014          181 > 09/26/2014    180 > 09/17/2016   182 > 10/16/2016   171  Wt Readings  from Last 3 Encounters:  07/14/14 180 lb (81.647 kg)  01/29/14 187 lb 3.2 oz (84.913 kg)  10/12/13 182 lb 1.6 oz (82.6 kg)    Vital signs reviewed  - Note on arrival 02 sats  94% on RA     HEENT: nl   turbinates, and orophanx. 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 No bruits or organomegaly, bowel sounds nl  MS:  warm without deformities, calf tenderness, cyanosis or clubbing  SKIN: warm and dry without lesions    NEURO:  alert, approp, no deficits         CXR PA and Lateral:   09/17/2016 :    I personally reviewed images and agree with radiology impression as follows:    1. Cardiac pacer noted in stable position. Prior CABG. Congestive heart failure mild basilar interstitial edema and small bilateral pleural effusions. 2. Large hiatal hernia.              Assessment & Plan:   Outpatient Encounter Prescriptions as of 10/16/2016  Medication Sig  . acetaminophen (TYLENOL) 325 MG tablet Take 650 mg by mouth as needed.   Marland Kitchen amiodarone (PACERONE) 200 MG tablet Take 400 mg by mouth daily.  Marland Kitchen aspirin EC 81 MG tablet Take 1 tablet (81 mg total) by mouth daily.  . cetirizine (ZYRTEC) 10 MG tablet Take 10 mg by mouth daily. 1 hour before bedtime  . Cholecalciferol (VITAMIN D3) 5000 UNITS CAPS Take 5,000 Units by mouth every Monday.   . clopidogrel (PLAVIX) 75 MG tablet Take 1 tablet (75 mg total) by mouth daily.  . fludrocortisone (FLORINEF) 0.1 MG tablet One daily to help supporrt BP  . guaiFENesin (MUCINEX) 600 MG 12 hr tablet Take 600 mg by mouth 2 (two) times daily.   Marland Kitchen levothyroxine (SYNTHROID,  LEVOTHROID) 137 MCG tablet Take 1 tablet (137 mcg total) by mouth daily before breakfast.  . midodrine (PROAMATINE) 5 MG tablet Take 2 tablets (10 mg total) by mouth 3 (three) times daily with meals.  . pantoprazole (PROTONIX) 40 MG tablet Take 40 mg by mouth 2 (two) times daily.  . pravastatin (PRAVACHOL) 40 MG tablet Take 40 mg by mouth at bedtime.  . psyllium (METAMUCIL SMOOTH TEXTURE) 28 % packet Take 1 packet by mouth daily.  Marland Kitchen rOPINIRole (REQUIP) 0.5 MG tablet Take 0.5 mg by mouth daily.   Marland Kitchen torsemide (DEMADEX) 10 MG tablet Take 10 mg by mouth every other day.   . traZODone (DESYREL) 50 MG tablet Take 50 mg by mouth at bedtime. May give an additional 25mg  1 hour later if unable to sleep as needed.  . cefdinir (OMNICEF) 300 MG capsule Take 1 capsule (300 mg total) by mouth 2 (two) times daily.  . predniSONE (DELTASONE) 10 MG tablet Take  4 each am x 2 days,   2 each am x 2 days,  1 each am x 2 days and stop   No facility-administered encounter medications on file as of 10/16/2016.

## 2016-10-17 ENCOUNTER — Telehealth: Payer: Self-pay | Admitting: Cardiovascular Disease

## 2016-10-17 ENCOUNTER — Telehealth: Payer: Self-pay | Admitting: Internal Medicine

## 2016-10-17 DIAGNOSIS — R0600 Dyspnea, unspecified: Secondary | ICD-10-CM

## 2016-10-17 DIAGNOSIS — R0609 Other forms of dyspnea: Principal | ICD-10-CM

## 2016-10-17 NOTE — Assessment & Plan Note (Signed)
-   08/11/2014  Walked RA x 1 laps @ 185 ft each stopped due to fatigue/off balance/ slow pace/  no sob or desat  - PFT's  09/26/2014  FEV1 2.26 (85 % ) ratio 76  p no % improvement from saba with DLCO  67 % corrects to 93 % for alv volume      Since prev study 08/04/13 minimal change lung vol or dlco  09/17/2016   Walked RA x one lap @ 185 stopped due to  Sob, no desat, slow pace with walker, no desat     Lab Results  Component Value Date   ESRSEDRATE 21 (H) 09/17/2016   ESRSEDRATE 37 (H) 08/11/2014    Improved with increase diuretics so doubt amio toxicity/ ok to continue and f/u here with cxr in  6 week/ sooner prn  Wife /pt very very shaky on details of care and turns out daughter fills the pill boxes but not clear he's taking the ppi bid ac as rec as many of his symptoms may relate to large HH/ gerd > reinforced.  I had an extended discussion with the patient reviewing all relevant studies completed to date and  lasting 15 to 20 minutes of a 25 minute visit    Each maintenance medication was reviewed in detail including most importantly the difference between maintenance and prns and under what circumstances the prns are to be triggered using an action plan format that is not reflected in the computer generated alphabetically organized AVS.    Please see AVS for specific instructions unique to this visit that I personally wrote and verbalized to the the pt in detail and then reviewed with pt  by my nurse highlighting any  changes in therapy recommended at today's visit to their plan of care.

## 2016-10-17 NOTE — Telephone Encounter (Signed)
Spoke with daughter and she states she had to leave the appt yesterday and didn't get any details. I tried to answer the questions through MW note to the best of my ability to answer her questions. She wanted to know if MW heard any crackling in his lungs and if he had any concerns about his lung condition. I advised her that it was recommended to repeat CXR in 6 weeks   LUNGS: no acc muscle use, nl contour with minimal insp crackles bilaterally/ no cough on inps   Note above states addresses the crackling but MW do you have anything else you want to disclose to his daughter. I just wanted to make sure. Thanks.

## 2016-10-17 NOTE — Telephone Encounter (Signed)
Returned call to patient's daughter Caryl Asp.She stated she wanted to know  about appointment with Dr.Croitoru 10/30/16.Advised father is scheduled to have pacemaker checked 10/30/16 at 8:20 am.

## 2016-10-17 NOTE — Telephone Encounter (Signed)
Spoke with daughter, I read MW message and she states the answer is what she was looking for and she will be present at the next visit with chest xray before he is seen. I advised them to come early for xray, order already placed. She agreed. FYI MW

## 2016-10-17 NOTE — Telephone Encounter (Signed)
New Message     Please call daughter she has questions regarding the appt on 9/26 , wants Ovid Curd to call her

## 2016-10-17 NOTE — Assessment & Plan Note (Signed)
-   h/o acei intolerance  - allergy profile 07/14/2014 >  Eos 0.2 / IgE 16 with neg RAST - sinus CT 07/21/2014 >>There is minimal mucosal thickening in the inferior right maxillary sinus which has a chronic appearance. No air-fluid levels or evidence of acute sinusitis - Huge HH on cxr  08/11/2014  - challenge with zyrtec 10 mg each pm 09/17/2016 >>>   Present flare of symptoms likely related to active sinus dz > try omnicef 300 bid x 10 d as allergy to pcn reported only to be a rash

## 2016-10-17 NOTE — Telephone Encounter (Signed)
We spent extra time with them both yesterday, a total of three "back to the rooms" to help them understand the recs which were written instructions  -  I am happy to make sure in future we co-ordinate the visit with the daughter as she is responsible for the med rx   There is no urgency to the condition that requires any change vs what I wrote out for them and I want to make sure he follows these instructions first before going further to work up his lungs  I was not impressed the crackles were anything unusual or needed further w/u yesterday in the absence of any clinical correlation (most of his symptoms were nasal and were addressed)   If daughter not happy with this, we can more up f/u anytime she can come too or refer to another provider

## 2016-10-21 ENCOUNTER — Other Ambulatory Visit: Payer: Self-pay | Admitting: *Deleted

## 2016-10-21 MED ORDER — PRAVASTATIN SODIUM 40 MG PO TABS: 40.0000 mg | ORAL_TABLET | Freq: Every day | ORAL | 3 refills | Status: DC

## 2016-10-21 NOTE — Telephone Encounter (Signed)
[facility]  

## 2016-10-23 ENCOUNTER — Encounter: Payer: Self-pay | Admitting: Internal Medicine

## 2016-10-23 ENCOUNTER — Non-Acute Institutional Stay: Payer: Medicare Other | Admitting: Internal Medicine

## 2016-10-23 VITALS — BP 124/60 | HR 70 | Temp 97.7°F | Resp 18 | Ht 70.0 in | Wt 174.4 lb

## 2016-10-23 DIAGNOSIS — R058 Other specified cough: Secondary | ICD-10-CM

## 2016-10-23 DIAGNOSIS — I951 Orthostatic hypotension: Secondary | ICD-10-CM | POA: Diagnosis not present

## 2016-10-23 DIAGNOSIS — G2581 Restless legs syndrome: Secondary | ICD-10-CM

## 2016-10-23 DIAGNOSIS — I5042 Chronic combined systolic (congestive) and diastolic (congestive) heart failure: Secondary | ICD-10-CM

## 2016-10-23 DIAGNOSIS — R05 Cough: Secondary | ICD-10-CM | POA: Diagnosis not present

## 2016-10-23 NOTE — Progress Notes (Signed)
Provider:  Blanchie Serve MD  Location:  [place] of Service:  Clinic (12)  PCP: Blanchie Serve, MD Patient Care Team: Blanchie Serve, MD as PCP - General (Internal Medicine) Croitoru, Dani Gobble, MD as Attending Physician (Cardiology) Vevelyn Royals, MD as Consulting Physician (Ophthalmology) Franchot Gallo, MD as Consulting Physician (Urology) Tanda Rockers, MD as Consulting Physician (Pulmonary Disease) Allyn Kenner, MD (Dermatology) Deliah Goody, PA-C as Physician Assistant (Physician Assistant) Ngetich, Nelda Bucks, NP as Nurse Practitioner (Family Medicine)  Extended Emergency Contact Information Primary Emergency Contact: Thorstenson,[name] Address: [address]          [place] Phone: [phone] Mobile Phone: [phone] Relation: Spouse Secondary Emergency Contact: [name],Hosman  [place] Phone: [phone] Mobile Phone: [phone] Relation: Daughter  Code Status: Full Code  Goals of Care: Advanced Directive information Advanced Directives 10/02/2016  Does Patient Have a Medical Advance Directive? Yes  Type of Advance Directive Living will;Healthcare Power of Attorney  Does patient want to make changes to medical advance directive? No - Patient declined  Copy of [place] in Chart? Yes  Would patient like information on creating a medical advance directive? -  Pre-existing out of facility DNR order (yellow form or pink MOST form) -      Chief Complaint  Patient presents with  . Acute Visit    2 week follow up  . Medication Refill    No refills needed at this time.   Marland Kitchen Results    Discuss labs    HPI: Patient is a 83 y.o. male seen today for follow up visit. wife present this visit. He is currently on antibiotic for upper airway cough syndrome with sinus infection x 10 days from 9/12. Reviewed dr wert's note. Breathing has improved. Cough has subsided. Denies  heartburn or indigestion. Appetite has picked up. Has been taking psyllium bid and miralax only as needed. No further diarrhea reported. Taking his midodrine as prescribed. Has resumed requip once a day. This has been helping with RLS. Weight appears stable on review.     Past Medical History:  Diagnosis Date  . Arthritis    "minor, back and sometimes knees" (12/15/2012)  . Bradycardia    AFib/SSS s/p St Jude PPM 04/12/2008  . CAD (coronary artery disease) 12/30/2014   CABG (LIMA-LAD, SVG-RCA, SVG-OM in 1996).  07/2009 BMS to SVG-RCA. Cath in 04/2010 with patent stents   . Cardiomyopathy, ischemic 08/25/2012  . CHF (congestive heart failure) ([place])   . Chronic knee pain 12/03/2014  . Combined congestive systolic and diastolic heart failure ([place]) 02/02/2015   Hx EF 41%. BNP 96.8 02/21/15 Torsemide 04/06/15 Na 142, K 4.6, Bun 16, creat 0.89 04/20/14 BNP 111.7, Na 142, K 4.6, Bun 16, creat 0.9   . Depression with anxiety 02/02/2015   02/21/15 Hgb A1c 5.8 03/10/15 MMSE 30/30   . Dizziness, after diuretic asscoiated with hypotension and responded to fluid bolus 06/05/2011   04/28/15 US carotid R+L normal bilateral arterial velocities.    Marland Kitchen Dyspnea 08/11/2014   Followed in Pulmonary clinic/ [facility]/ Wert  - 08/11/2014  Walked RA x 1 laps @ 185 ft each stopped due to fatigue/off balance/ slow pace/  no sob or desat  - PFT's  09/26/2014  FEV1 2.26 (85 % ) ratio 76  p no % improvement from saba with DLCO  67 % corrects to 93 % for alv volume  Since prev study 08/04/13 minimal change lung vol or dlco    . Embolic cerebral infarction ([place]) 12/06/2015  . Exertional shortness of breath    "sometimes walking" (12/15/2012)  . GERD (gastroesophageal reflux disease)   . Gout 02/09/2015  . Heart murmur    "just told I had one today" (12/15/2012)  . Hiatal hernia   . Hyperlipidemia   . Hypertension   . Hypothyroid   . Influenza A 03/10/2016  . Insomnia   . Melanoma of back ([place]) 1976  . Myocardial infarction  [facility]) 1996; 2011   "both silent" (12/15/2012)  . Nonrheumatic aortic valve stenosis   . Orthostatic hypotension   . Osteoporosis, senile   . Pacemaker   . RBBB   . Restless leg 02/02/2015  . Right leg weakness 12/06/2015  . [facility] spotted fever   . S/P CABG x 4   . Sick sinus syndrome ([place]) 01/31/2014  . Sustained ventricular tachycardia ([place]) 07/27/2014  . Urinary retention 12/30/2014   Past Surgical History:  Procedure Laterality Date  . CARDIAC CATHETERIZATION  04/2010   LIMA to LAD patent,SVG to OM patent,no in-stnet restenosis RCA  . CATARACT EXTRACTION W/ INTRAOCULAR LENS  IMPLANT, BILATERAL Bilateral 2012  . CORONARY ANGIOPLASTY WITH STENT PLACEMENT  07/2009   bare metal stent to SVG to the RCA  . CORONARY ARTERY BYPASS GRAFT  1996   LIMA to LAD,SVG to RCA & SVG to OM  . INSERT / REPLACE / REMOVE PACEMAKER  2010  . MELANOMA EXCISION  05/1974 X2   "taken off my back" (12/15/2012)  . NM MYOVIEW LTD  06/2011   low risk  . TONSILLECTOMY  1938  . TRANSURETHRAL RESECTION OF PROSTATE  1986  . US ECHOCARDIOGRAPHY  07/11/2009   EF 45-50%    reports that he has never smoked. He has never used smokeless tobacco. He reports that he does not drink alcohol or use drugs. Social History   Social History  . Marital status: Married    Spouse name: N/A  . Number of children: 2  . Years of education: Masters   Occupational History  . Retired Company secretary -Pensions consultant    Social History Main Topics  . Smoking status: Never Smoker  . Smokeless tobacco: Never Used  . Alcohol use No  . Drug use: No  . Sexual activity: No   Other Topics Concern  . Not on file   Social History Narrative   Lives at [place] to [place] 01/09/15   Married - Violet   Never smoked   Alcohol none   Previously employed as Scientist, forensic for [employer].         Diet:Low sodium   Do you drink/eat things with caffeine? No   Marital status: Married                               What year were you married?1950   Do you live in a house, apartment, assisted living, condo, trailer, etc)?    Is it one or more stories? 1   How many persons live in your home? 2   Do you have any pets in your home? No   Current or past profession: Minister, [employer]   Do you exercise?     Very Little  Type & how often:    Do you have a living will?  Yes   Do you have a DNR Form? Yes   Do you have a POA/HPOA forms? Yes    Functional Status Survey:    Family History  Problem Relation Age of Onset  . Coronary artery disease Mother   . Diabetes Mother   . Heart disease Mother   . Coronary artery disease Father   . Diabetes Father   . Lung cancer Father   . Heart disease Father   . Anuerysm Son   . Heart disease Brother     Health Maintenance  Topic Date Due  . INFLUENZA VACCINE  11/04/2016 (Originally 09/04/2016)  . PNA vac Low Risk Adult (2 of 2 - PCV13) 02/04/2017 (Originally 10/06/2010)  . TETANUS/TDAP  08/01/2026 (Originally 08/01/2024)  . DEXA SCAN  12/14/2024    Allergies  Allergen Reactions  . Altace [Ramipril] Cough  . Crestor [Rosuvastatin Calcium] Rash  . Penicillins Rash and Other (See Comments)    Has patient had a PCN reaction causing immediate rash, facial/tongue/throat swelling, SOB or lightheadedness with hypotension: No Has patient had a PCN reaction causing severe rash involving mucus membranes or skin necrosis: Yes Has patient had a PCN reaction that required hospitalization Yes Has patient had a PCN reaction occurring within the last 10 years: No If all of the above answers are "NO", then may proceed with Cephalosporin use.   . Sulfa Antibiotics Rash    Outpatient Encounter Prescriptions as of 10/23/2016  Medication Sig  . acetaminophen (TYLENOL) 325 MG tablet Take 650 mg by mouth as needed.   Marland Kitchen amiodarone (PACERONE) 200 MG tablet Take 400 mg by mouth daily.  Marland Kitchen aspirin EC 81 MG tablet Take 1  tablet (81 mg total) by mouth daily.  . cefdinir (OMNICEF) 300 MG capsule Take 1 capsule (300 mg total) by mouth 2 (two) times daily.  . cetirizine (ZYRTEC) 10 MG tablet Take 10 mg by mouth daily. 1 hour before bedtime  . Cholecalciferol (VITAMIN D3) 5000 UNITS CAPS Take 5,000 Units by mouth every Monday.   . clopidogrel (PLAVIX) 75 MG tablet Take 1 tablet (75 mg total) by mouth daily.  . fludrocortisone (FLORINEF) 0.1 MG tablet One daily to help supporrt BP  . guaiFENesin (MUCINEX) 600 MG 12 hr tablet Take 600 mg by mouth 2 (two) times daily.   Marland Kitchen levothyroxine (SYNTHROID, LEVOTHROID) 137 MCG tablet Take 1 tablet (137 mcg total) by mouth daily before breakfast.  . midodrine (PROAMATINE) 5 MG tablet Take 2 tablets (10 mg total) by mouth 3 (three) times daily with meals.  . polyethylene glycol (MIRALAX / GLYCOLAX) packet Take 17 g by mouth as needed (if no bowel movement in 3 days).  . pravastatin (PRAVACHOL) 40 MG tablet Take 1 tablet (40 mg total) by mouth at bedtime.  . predniSONE (DELTASONE) 10 MG tablet Take  4 each am x 2 days,   2 each am x 2 days,  1 each am x 2 days and stop  . psyllium (METAMUCIL SMOOTH TEXTURE) 28 % packet Take 1 packet by mouth 2 (two) times daily.  Marland Kitchen rOPINIRole (REQUIP) 0.5 MG tablet Take 0.5 mg by mouth daily.   Marland Kitchen torsemide (DEMADEX) 10 MG tablet Take 10 mg by mouth every other day.   . traZODone (DESYREL) 50 MG tablet Take 50 mg by mouth at bedtime. May give an additional 25mg  1 hour later if unable to sleep as needed.  . [DISCONTINUED]  pantoprazole (PROTONIX) 40 MG tablet Take 40 mg by mouth 2 (two) times daily.  . [DISCONTINUED] psyllium (METAMUCIL SMOOTH TEXTURE) 28 % packet Take 1 packet by mouth daily.   No facility-administered encounter medications on file as of 10/23/2016.     Review of Systems  Constitutional: Negative for appetite change, chills and fatigue.       Improved energy level.   HENT: Positive for hearing loss.   Eyes: Negative for visual  disturbance.  Respiratory: Positive for cough. Negative for shortness of breath and wheezing.        Mostly dry cough, denies worsening of cough. Has ongoing dyspnea, no recent worsening  Cardiovascular: Negative for chest pain, palpitations and leg swelling.  Gastrointestinal: Negative for abdominal pain, blood in stool, diarrhea, nausea, rectal pain and vomiting.  Genitourinary: Negative for dysuria.       Has indwelling foley catheter for urinary retention  Musculoskeletal: Positive for gait problem. Negative for back pain.       No falls  Neurological: Positive for dizziness and tremors. Negative for seizures, syncope, weakness, numbness and headaches.       Dizziness with position change, using walker with assistance to get around, improved strength      Vitals:   10/23/16 1138  BP: 124/60  Pulse: 70  Resp: 18  Temp: 97.7 F (36.5 C)  TempSrc: Oral  SpO2: 99%  Weight: 174 lb 6.4 oz (79.1 kg)  Height: 5\' 10"  (1.778 m)   Wt Readings from Last 3 Encounters:  10/23/16 174 lb 6.4 oz (79.1 kg)  10/16/16 171 lb (77.6 kg)  10/09/16 182 lb 3.2 oz (82.6 kg)   Body mass index is 25.02 kg/m. Physical Exam  Constitutional: He is oriented to person, place, and time. He appears well-developed. No distress.  HENT:  Head: Normocephalic and atraumatic.  Eyes: Pupils are equal, round, and reactive to light. Conjunctivae are normal.  Neck: Neck supple. No JVD present. No thyromegaly present.  Cardiovascular: Normal rate and regular rhythm.   Murmur heard. Pulmonary/Chest: Effort normal. No respiratory distress. He has no wheezes. He has no rales. He exhibits no tenderness.  Abdominal: Soft. Bowel sounds are normal. He exhibits no distension. There is no tenderness.  Genitourinary: Rectal exam shows guaiac negative stool.  Genitourinary Comments: Foley catheter in place with clear urine  Musculoskeletal: He exhibits edema.  Can move all 4 extremities, weakness to his legs, arthritis  changes present, wide based gait and uses walker. trace pedal edema  Lymphadenopathy:    He has no cervical adenopathy.  Neurological: He is alert and oriented to person, place, and time.  Skin: Skin is warm and dry. He is not diaphoretic. No erythema.  Easy bruising,   Psychiatric: He has a normal mood and affect.    Labs reviewed: Basic Metabolic Panel:  Recent Labs  03/11/16 0436  08/01/16 0022 08/15/16 09/17/16 1517  NA 139  < > 137 141 139  K 3.7  < > 4.1 3.7 4.1  CL 108  --  105  --  106  CO2 25  --  27  --  28  GLUCOSE 105*  --  104*  --  107*  BUN 18  < > 15 14 12   CREATININE 0.72  < > 1.00 0.9 0.79  CALCIUM 8.0*  --  8.5*  --  8.6  < > = values in this interval not displayed. Liver Function Tests:  Recent Labs  01/10/16 1400 03/10/16 1443 03/11/16 0436 04/03/16  05/08/16 08/15/16  AST 25 53* 46* 15 20 22   ALT 22 26 23 14 19 21   ALKPHOS 68 61 51 60 78 62  BILITOT 0.4 0.6 0.4  --   --   --   PROT 6.4* 6.9 5.8*  --   --   --   ALBUMIN 3.5 3.7 3.1*  --   --   --    No results for input(s): LIPASE, AMYLASE in the last 8760 hours. No results for input(s): AMMONIA in the last 8760 hours. CBC:  Recent Labs  03/10/16 1443  07/31/16 1359 08/01/16 0022 08/06/16 1641 08/15/16 09/17/16 1517  WBC 8.3  < > CANCELED 6.9 7.0 5.4  5.4 6.1  NEUTROABS 5.8  --  CANCELED  --   --   --  4.0  HGB 13.1  < > CANCELED 12.9* 14.3 12.5*  12.5* 13.4  HCT 39.5  < > CANCELED 38.3* 42.1 37*  37* 40.5  MCV 95.6  < > CANCELED 94.3 94  --  98.6  PLT 190  < > CANCELED 209 243 202 228.0  < > = values in this interval not displayed. Cardiac Enzymes:  Recent Labs  01/10/16 1904 01/10/16 2342 01/11/16 0537  TROPONINI <0.03 <0.03 <0.03   BNP: Invalid input(s): POCBNP Lab Results  Component Value Date   HGBA1C 5.6 12/07/2015   Lab Results  Component Value Date   TSH 1.53 09/17/2016   No results found for: VITAMINB12 No results found for: FOLATE Lab Results  Component  Value Date   FERRITIN 88 10/11/2016    Imaging and Procedures obtained prior to SNF admission: Ct Abdomen Pelvis W Contrast  Result Date: 08/01/2016 CLINICAL DATA:  83 y/o M; increasing lower abdominal pressure, chronic foley, abd distention. no nausea or vomiting. EXAM: CT ABDOMEN AND PELVIS WITH CONTRAST TECHNIQUE: Multidetector CT imaging of the abdomen and pelvis was performed using the standard protocol following bolus administration of intravenous contrast. CONTRAST:  100 cc Isovue-300 COMPARISON:  12/29/2014 CT of the abdomen and pelvis. FINDINGS: Lower chest: Large hiatal hernia containing the entirety of the stomach. Partially visualized pacemaker leads in right atrium and ventricle. Mitral valvular calcification. Hepatobiliary: No focal liver abnormality is seen. No gallstones, gallbladder wall thickening, or biliary dilatation. Pancreas: Unremarkable. No pancreatic ductal dilatation or surrounding inflammatory changes. Spleen: Normal in size without focal abnormality. Adrenals/Urinary Tract: Stable bilateral renal cysts the largest in the left lower pole measuring up to 4.9 cm. No urinary stone disease or hydronephrosis. The bladder is collapsed around the Foley catheter. Diffuse bladder wall thickening. Stomach/Bowel: No obstructive or inflammatory changes of the bowel. Large volume of stool throughout the colon. Vascular/Lymphatic: 14 mm penetrating ulcer directed posteriorly and inferiorly from the left lateral wall of infrarenal abdominal aorta (series 4, image 8). Severe calcific atherosclerosis of the abdominal aorta. Reproductive: Prostate is unremarkable. Other: No abdominal wall hernia or abnormality. No abdominopelvic ascites. Musculoskeletal: Advanced degenerative changes of the visible thoracic and lumbar spine. No acute osseous abnormality is identified. Mild bilateral hip osteoarthrosis. IMPRESSION: 1. Bladder collapsed around the Foley catheter. Bladder wall thickening more than  expected for underdistention, this may represent cystitis or be due to prior hypertrophy. 2. Large volume of stool in the colon, probably constipation. 3. Large hiatal hernia containing the entirety of stomach. 4. 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 Electronically Signed   By: Kristine Garbe M.D.   On: 08/01/2016 03:12   Chest X-Ray 2 Views 08/14/16 Impression. Cardiomegaly,  chronic and postoperative changes as discussed. No evidence of acute pulmonary pathology  Echocardiogram 08/20/16 Moderate AS, severe LV systolic dysfunction with abnormal relaxation, EF 35%, right lead pacemaker in place   Assessment/Plan  1. Chronic combined systolic and diastolic congestive heart failure (HCC) Weight stable. Continue demadex every other day for now. Breathing improved. Monitor clinically.   2. Upper airway cough syndrome Continue omnicef and prednisone and complete course  3. Orthostatic hypotension Continue midodrine and florinef. bp holding stable  4. RLS (restless legs syndrome) Continue requip daily at bedtime only and monitor  F/u in 3 month with labs    [facility], MD Internal Medicine [facility] [place], [place] [address] Cell Phone (Monday-Friday 8 am - 5 pm): [phone] On Call: [phone] and follow prompts after 5 pm and on weekends Office Phone: [phone] Office Fax: [phone]

## 2016-10-29 ENCOUNTER — Ambulatory Visit: Payer: Medicare Other | Admitting: Physician Assistant

## 2016-10-29 ENCOUNTER — Ambulatory Visit (INDEPENDENT_AMBULATORY_CARE_PROVIDER_SITE_OTHER): Payer: Medicare Other | Admitting: Cardiovascular Disease

## 2016-10-29 ENCOUNTER — Encounter: Payer: Self-pay | Admitting: Cardiovascular Disease

## 2016-10-29 VITALS — BP 120/73 | HR 70 | Wt 174.0 lb

## 2016-10-29 DIAGNOSIS — Z5181 Encounter for therapeutic drug level monitoring: Secondary | ICD-10-CM | POA: Diagnosis not present

## 2016-10-29 DIAGNOSIS — I472 Ventricular tachycardia, unspecified: Secondary | ICD-10-CM

## 2016-10-29 DIAGNOSIS — Z950 Presence of cardiac pacemaker: Secondary | ICD-10-CM | POA: Diagnosis not present

## 2016-10-29 DIAGNOSIS — I495 Sick sinus syndrome: Secondary | ICD-10-CM

## 2016-10-29 DIAGNOSIS — I2510 Atherosclerotic heart disease of native coronary artery without angina pectoris: Secondary | ICD-10-CM

## 2016-10-29 DIAGNOSIS — I255 Ischemic cardiomyopathy: Secondary | ICD-10-CM | POA: Diagnosis not present

## 2016-10-29 DIAGNOSIS — I951 Orthostatic hypotension: Secondary | ICD-10-CM | POA: Diagnosis not present

## 2016-10-29 DIAGNOSIS — Z8673 Personal history of transient ischemic attack (TIA), and cerebral infarction without residual deficits: Secondary | ICD-10-CM | POA: Diagnosis not present

## 2016-10-29 DIAGNOSIS — Z79899 Other long term (current) drug therapy: Secondary | ICD-10-CM

## 2016-10-29 DIAGNOSIS — I5042 Chronic combined systolic (congestive) and diastolic (congestive) heart failure: Secondary | ICD-10-CM

## 2016-10-29 MED ORDER — AMIODARONE HCL 200 MG PO TABS
200.0000 mg | ORAL_TABLET | Freq: Every day | ORAL | 3 refills | Status: DC
Start: 2016-10-29 — End: 2017-01-16

## 2016-10-29 MED ORDER — FLUDROCORTISONE ACETATE 0.1 MG PO TABS: 0.1000 mg | ORAL_TABLET | ORAL | 3 refills | Status: DC

## 2016-10-29 NOTE — Patient Instructions (Signed)
Dr Sallyanne Kuster has recommended making the following medication changes: 1. DECREASE Amiodarone to 200 mg daily 2. DECREASE Florinef to 1 tablet four times weekly - Tuesdays, Thursdays, Saturdays, and Sundays 3. DECREASE Torsemide to 1 tablet twice weekly  Your physician recommends that you schedule a follow-up appointment in 3-4 weeks with Almyra Deforest, PA.  Dr Sallyanne Kuster recommends that you schedule a follow-up appointment in 4 months. You will receive a reminder letter in the mail two months in advance. If you don't receive a letter, please call our office to schedule the follow-up appointment.  If you need a refill on your cardiac medications before your next appointment, please call your pharmacy.

## 2016-10-29 NOTE — Progress Notes (Signed)
Patient ID: Adam [name], male   DOB: 25-Dec-1926, 83 y.o.   MRN: [id number]    Cardiology Office Note    Date:  10/31/2016   ID:  Adam [name], DOB May 27, 1926, MRN [id number]  PCP:  Blanchie Serve, MD  Cardiologist:   [name], MD   Chief Complaint  Patient presents with  . Follow-up    sob, pt c/o shakes    History of Present Illness:  Adam [name] is a 83 y.o. male with a long-standing history of coronary artery disease and previous bypass surgery with an extensive scar from inferior wall myocardial infarction, ischemic cardiomyopathy with combined systolic and diastolic heart failure, history of sustained symptomatic ventricular tachycardia (probably "scar VT"), previous ischemic stroke, sinus node dysfunction with dual chamber permanent pacemaker, severe orthostatic hypotension that is felt to be neurogenic in etiology.   Despite taking midodrine 10 mg 3 times daily and Florinef 0.1 mg daily he continues to have episodes of symptomatic hypotension when his systolic blood pressure dops to the 80s and 90s. He has not had any elevated blood pressure recordings. Because of fluid retention, likely related to use of fludrocortisone, he has been started on torsemide every other day as well, which is obviously counterproductive. We tried to add Northerra, but insurance coverage was denied.  Reviewed again the importance of avoiding supine position for at least 4 hours after taking the vasoconstrictor medication. He should not be taking this medication less than 6 hours before bedtime. Okay to recline, but not lie fully horizontal.   He has CAD and is currently free of angina pectoris. He underwent bypass surgery 1996. He had placement of a stent to the SVG to RCA in 2011. His last cardiac catheterization in 2012 show that vessel to be widely patent but all his native coronary arteries are occluded and he is graft dependent. He is nuclear stress test in December 2015 showed an extensive  inferior wall scar without any areas of ischemia. in June 2016 he presented with rapid palpitations associated with near-syncope, dyspnea and extreme fatigue , but did not lose consciousness. Interrogation of his pacemaker showed relatively slow ventricular tachycardia at around 150 bpm that appear to have a regular, monomorphic electrogram. His dose of amiodarone was increased and he has not had ventricular tachycardia since. He was evaluated by Dr. Caryl Comes. After some debate, we decided that a conservative approach is most appropriate in view of his declining overall functional status and advanced age. Unfortunately has also been developing progressive dementia, primarily manifested as short-term memory loss. He is now a resident at [facility]. In November 2017 he presented with a right lower extremity weakness and was diagnosed as having an ischemic stroke. He was prescribed an oral anticoagulant due to suspicion that his stroke was related to atrial fibrillation, but atrial fibrillation has never occurred/has never been recorded by his pacemaker so this medication was stopped.  Current device interrogation: St. Jude accent DR RF device is functioning normally and has an estimated generator longevity of about 3 months. Lead parameters remain normal. He has 99% atrial pacing and there does not appear to be any underlying atrial rhythm. He also has 70% ventricular pacing. His pacemaker has not shown recent VT, and it has never recorded atrial fibrillation. He is very sedentary and, as expected, heart rate histogram distribution is very blunted. We'll try once more to reduce his dose of amiodarone today, to reduce the frequency of ventricular pacing    Past Medical  History:  Diagnosis Date  . Arthritis    "minor, back and sometimes knees" (12/15/2012)  . Bradycardia    AFib/SSS s/p St Jude PPM 04/12/2008  . CAD (coronary artery disease) 12/30/2014   CABG (LIMA-LAD, SVG-RCA, SVG-OM in 1996).  07/2009 BMS to  SVG-RCA. Cath in 04/2010 with patent stents   . Cardiomyopathy, ischemic 08/25/2012  . CHF (congestive heart failure) ([place])   . Chronic knee pain 12/03/2014  . Combined congestive systolic and diastolic heart failure ([place]) 02/02/2015   Hx EF 41%. BNP 96.8 02/21/15 Torsemide 04/06/15 Na 142, K 4.6, Bun 16, creat 0.89 04/20/14 BNP 111.7, Na 142, K 4.6, Bun 16, creat 0.9   . Depression with anxiety 02/02/2015   02/21/15 Hgb A1c 5.8 03/10/15 MMSE 30/30   . Dizziness, after diuretic asscoiated with hypotension and responded to fluid bolus 06/05/2011   04/28/15 US carotid R+L normal bilateral arterial velocities.    Marland Kitchen Dyspnea 08/11/2014   Followed in Pulmonary clinic/ [facility]/ [place]  - 08/11/2014  Walked RA x 1 laps @ 185 ft each stopped due to fatigue/off balance/ slow pace/  no sob or desat  - PFT's  09/26/2014  FEV1 2.26 (85 % ) ratio 76  p no % improvement from saba with DLCO  67 % corrects to 93 % for alv volume      Since prev study 08/04/13 minimal change lung vol or dlco    . Embolic cerebral infarction ([place]) 12/06/2015  . Exertional shortness of breath    "sometimes walking" (12/15/2012)  . GERD (gastroesophageal reflux disease)   . Gout 02/09/2015  . Heart murmur    "just told I had one today" (12/15/2012)  . Hiatal hernia   . Hyperlipidemia   . Hypertension   . Hypothyroid   . Influenza A 03/10/2016  . Insomnia   . Melanoma of back ([place]) 1976  . Myocardial infarction [facility]) 1996; 2011   "both silent" (12/15/2012)  . Nonrheumatic aortic valve stenosis   . Orthostatic hypotension   . Osteoporosis, senile   . Pacemaker   . RBBB   . Restless leg 02/02/2015  . Right leg weakness 12/06/2015  . [facility] spotted fever   . S/P CABG x 4   . Sick sinus syndrome ([place]) 01/31/2014  . Sustained ventricular tachycardia ([place]) 07/27/2014  . Urinary retention 12/30/2014    Past Surgical History:  Procedure Laterality Date  . CARDIAC CATHETERIZATION  04/2010   LIMA to LAD patent,SVG to OM  patent,no in-stnet restenosis RCA  . CATARACT EXTRACTION W/ INTRAOCULAR LENS  IMPLANT, BILATERAL Bilateral 2012  . CORONARY ANGIOPLASTY WITH STENT PLACEMENT  07/2009   bare metal stent to SVG to the RCA  . CORONARY ARTERY BYPASS GRAFT  1996   LIMA to LAD,SVG to RCA & SVG to OM  . INSERT / REPLACE / REMOVE PACEMAKER  2010  . MELANOMA EXCISION  05/1974 X2   "taken off my back" (12/15/2012)  . NM MYOVIEW LTD  06/2011   low risk  . TONSILLECTOMY  1938  . TRANSURETHRAL RESECTION OF PROSTATE  1986  . US ECHOCARDIOGRAPHY  07/11/2009   EF 45-50%    Current Medications: Outpatient Medications Prior to Visit  Medication Sig Dispense Refill  . acetaminophen (TYLENOL) 325 MG tablet Take 650 mg by mouth as needed.     Marland Kitchen aspirin EC 81 MG tablet Take 1 tablet (81 mg total) by mouth daily. 30 tablet 1  . cetirizine (ZYRTEC) 10 MG tablet Take 10  mg by mouth daily. 1 hour before bedtime    . Cholecalciferol (VITAMIN D3) 5000 UNITS CAPS Take 5,000 Units by mouth every Monday.     . clopidogrel (PLAVIX) 75 MG tablet Take 1 tablet (75 mg total) by mouth daily. 30 tablet 2  . guaiFENesin (MUCINEX) 600 MG 12 hr tablet Take 600 mg by mouth 2 (two) times daily.     Marland Kitchen levothyroxine (SYNTHROID, LEVOTHROID) 137 MCG tablet Take 1 tablet (137 mcg total) by mouth daily before breakfast. 90 tablet 3  . midodrine (PROAMATINE) 5 MG tablet Take 2 tablets (10 mg total) by mouth 3 (three) times daily with meals. 180 tablet 3  . polyethylene glycol (MIRALAX / GLYCOLAX) packet Take 17 g by mouth as needed (if no bowel movement in 3 days).    . pravastatin (PRAVACHOL) 40 MG tablet Take 1 tablet (40 mg total) by mouth at bedtime. 90 tablet 3  . psyllium (METAMUCIL SMOOTH TEXTURE) 28 % packet Take 1 packet by mouth 2 (two) times daily.    Marland Kitchen rOPINIRole (REQUIP) 0.5 MG tablet Take 0.5 mg by mouth daily.     Marland Kitchen torsemide (DEMADEX) 10 MG tablet Take 10 mg by mouth 2 (two) times a week.    . traZODone (DESYREL) 50 MG tablet Take 50 mg  by mouth at bedtime. May give an additional 25mg  1 hour later if unable to sleep as needed.    Marland Kitchen amiodarone (PACERONE) 200 MG tablet Take 400 mg by mouth daily.    . fludrocortisone (FLORINEF) 0.1 MG tablet One daily to help supporrt BP 30 tablet 5  . cefdinir (OMNICEF) 300 MG capsule Take 1 capsule (300 mg total) by mouth 2 (two) times daily. (Patient not taking: Reported on 10/29/2016) 20 capsule 0  . predniSONE (DELTASONE) 10 MG tablet Take  4 each am x 2 days,   2 each am x 2 days,  1 each am x 2 days and stop (Patient not taking: Reported on 10/29/2016) 14 tablet 0   No facility-administered medications prior to visit.      Allergies:   Altace [ramipril]; Crestor [rosuvastatin calcium]; Penicillins; and Sulfa antibiotics   Social History   Social History  . Marital status: Married    Spouse name: N/A  . Number of children: 2  . Years of education: Masters   Occupational History  . Retired Company secretary -Pensions consultant    Social History Main Topics  . Smoking status: Never Smoker  . Smokeless tobacco: Never Used  . Alcohol use No  . Drug use: No  . Sexual activity: No   Other Topics Concern  . None   Social History Narrative   Lives at [place] to [place] 01/09/15   Married - Violet   Never smoked   Alcohol none   Previously employed as Scientist, forensic for [employer].         Diet:Low sodium   Do you drink/eat things with caffeine? No   Marital status: Married                              What year were you married?1950   Do you live in a house, apartment, assisted living, condo, trailer, etc)?    Is it one or more stories? 1   How many persons live in your home? 2   Do you have any pets in your home? No   Current or past profession: Company secretary,  [employer]   Do you exercise?     Very Little                                                 Type & how often:    Do you have a living will?  Yes   Do you have a DNR Form? Yes   Do you have a  POA/HPOA forms? Yes     Family History:  The patient's family history includes Anuerysm in his son; Coronary artery disease in his father and mother; Diabetes in his father and mother; Heart disease in his brother, father, and mother; Lung cancer in his father.   ROS:   Please see the history of present illness.    ROS All other systems reviewed and are negative.   PHYSICAL EXAM:   VS:  BP 120/73   Pulse 70   Wt 174 lb (78.9 kg)   BMI 24.97 kg/m     General: Alert, oriented x3, no distress, lean, appears frail Head: no evidence of trauma, PERRL, EOMI, no exophtalmos or lid lag, no myxedema, no xanthelasma; normal ears, nose and oropharynx Neck: normal jugular venous pulsations and no hepatojugular reflux; brisk carotid pulses without delay and no carotid bruits Chest: clear to auscultation, no signs of consolidation by percussion or palpation, normal fremitus, symmetrical and full respiratory excursions, healthy pacemaker site Cardiovascular: normal position and quality of the apical impulse, regular rhythm, normal first and second heart sounds, no murmurs, rubs or gallops Abdomen: no tenderness or distention, no masses by palpation, no abnormal pulsatility or arterial bruits, normal bowel sounds, no hepatosplenomegaly Extremities: no clubbing, cyanosis or edema; 2+ radial, ulnar and brachial pulses bilaterally; 2+ right femoral, posterior tibial and dorsalis pedis pulses; 2+ left femoral, posterior tibial and dorsalis pedis pulses; no subclavian or femoral bruits Neurological: grossly nonfocal Psych: Normal mood and affect   Wt Readings from Last 3 Encounters:  10/29/16 174 lb (78.9 kg)  10/23/16 174 lb 6.4 oz (79.1 kg)  10/16/16 171 lb (77.6 kg)      Studies/Labs Reviewed:   EKG:  EKG is ordered today.  Atrial paced, ventricular sensed, right bundle branch block and left anterior fascicular block with very broad QRS at 192 ms, QTC 594 ms   Recent Labs: 08/15/2016: ALT  21 09/17/2016: BUN 12; Creatinine, Ser 0.79; Hemoglobin 13.4; Platelets 228.0; Potassium 4.1; Pro B Natriuretic peptide (BNP) 721.0; Sodium 139; TSH 1.53   Lipid Panel    Component Value Date/Time   CHOL 84 12/07/2015 0449   TRIG 102 12/07/2015 0449   HDL 41 12/07/2015 0449   CHOLHDL 2.0 12/07/2015 0449   VLDL 20 12/07/2015 0449   LDLCALC 23 12/07/2015 0449     ASSESSMENT:    1. Chronic combined systolic and diastolic congestive heart failure ([place])   2. Orthostatic hypotension   3. VT (ventricular tachycardia) ([place])   4. Encounter for monitoring amiodarone therapy   5. SSS (sick sinus syndrome) ([facility])   6. Pacemaker   7. Coronary artery disease involving native coronary artery of native heart without angina pectoris   8. History of arterial ischemic stroke      PLAN:  In order of problems listed above:  1. CHF: No evidence of hypervolemia by exam today. Taking both fludrocortisone and torsemide at the same time doesn't make sense. We'll  try to decrease the dose of fludrocortisone and gradually discontinue the torsemide. For now we'll reduce the torsemide to twice a week and reduce the fludrocortisone to 4 days weekly, with plan to completely discontinue the torsemide as soon as possible.  He is unable to take either beta blockers or RAAS inhibitors due to severe hypotension. Need to tolerate some degree of volume load. I am concerned that the high burden of ventricular pacing may be worsening his heart failure, this is due to amiodarone which is necessary to prevent VT. try to reduce the dose of amiodarone back to 200 mg daily.  2. Orthostatic hypotension: Still symptomatic. Hopefully will improve when we get rid of the diuretic, but will also try to get alternative funding for the Northerra. Continue the use of compression stockings and abdominal binder. We reviewed the appropriate use of ProAmatine. Okay to take 3 times daily, but he should not lay fully horizontal for 4 hours after  taking this medication. 3. VT: None seen in the last 12 months. 4. Amiodarone: Reduce the dose to 200 mg once daily. Continue to check liver tests and thyroid tests every 6 months. 5. SSS: He has no intrinsic atrial activity. "Atrially dependent". 6. PM: Normal device function. Continue downloads every 3 months and twice yearly office visits. 7. CAD: No angina. He has occluded native arteries and is graft dependent (LIMA to LAD, SVG to RCA, SVG to OM; status post stent in SVG to RCA 2011, patent by cath March 2012). November 2014 nuclear study shows inferior scar without ischemia, EF 41%.Conservative management is recommended. 8. History of CVA: His pacemaker has never shown evidence of atrial arrhythmia (flutter or fibrillation) that could explain his stroke. Anticoagulants are currently not indicated. Continue clopidogrel.    Medication Adjustments/Labs and Tests Ordered: Current medicines are reviewed at length with the patient today.  Concerns regarding medicines are outlined above.  Medication changes, Labs and Tests ordered today are listed in the Patient Instructions below. Patient Instructions  Dr Sallyanne Kuster has recommended making the following medication changes: 1. DECREASE Amiodarone to 200 mg daily 2. DECREASE Florinef to 1 tablet four times weekly - Tuesdays, Thursdays, Saturdays, and Sundays 3. DECREASE Torsemide to 1 tablet twice weekly  Your physician recommends that you schedule a follow-up appointment in 3-4 weeks with Almyra Deforest, PA.  Dr Sallyanne Kuster recommends that you schedule a follow-up appointment in 4 months. You will receive a reminder letter in the mail two months in advance. If you don't receive a letter, please call our office to schedule the follow-up appointment.  If you need a refill on your cardiac medications before your next appointment, please call your pharmacy.    Signed, [name], MD  10/31/2016 4:26 PM    [address], [place], [place]  [address] Phone: [phone]; Fax: [phone]

## 2016-10-30 ENCOUNTER — Telehealth: Payer: Self-pay | Admitting: Physician Assistant

## 2016-10-30 ENCOUNTER — Encounter: Payer: Medicare Other | Admitting: Cardiovascular Disease

## 2016-10-30 NOTE — Telephone Encounter (Signed)
New message     The benefits - medication [name] will need 2 prior authorization  [phone] from [facility] [facility],  1 to get medication and 1 for the quantity of medication ,the specialty pharmacy is [facility] [phone]    They are sending letter to patient as well

## 2016-11-13 ENCOUNTER — Telehealth: Payer: Self-pay | Admitting: Cardiovascular Disease

## 2016-11-13 NOTE — Telephone Encounter (Signed)
New message    Oley Balm is calling from [facility] about an authourization for pt [name]. Please call.

## 2016-11-14 NOTE — Telephone Encounter (Signed)
[name] triage approved for initial titration ; awaiting for [facility] pre-auth application to be completed.   Once prior-auth approved, need to contact [facility] for Rx  Reference number for f/u :[id number] (completed today by phone)

## 2016-11-15 NOTE — Telephone Encounter (Signed)
Duplicate. Please see encounter from 11/13/2016  Waiting response from [facility]

## 2016-11-18 NOTE — Telephone Encounter (Signed)
[facility] [facility] 30 day titration approved

## 2016-11-20 ENCOUNTER — Ambulatory Visit (INDEPENDENT_AMBULATORY_CARE_PROVIDER_SITE_OTHER): Payer: Medicare Other | Admitting: Physician Assistant

## 2016-11-20 ENCOUNTER — Encounter: Payer: Self-pay | Admitting: Physician Assistant

## 2016-11-20 VITALS — BP 122/78 | HR 76 | Ht 70.0 in | Wt 175.8 lb

## 2016-11-20 DIAGNOSIS — I5042 Chronic combined systolic (congestive) and diastolic (congestive) heart failure: Secondary | ICD-10-CM | POA: Diagnosis not present

## 2016-11-20 DIAGNOSIS — I951 Orthostatic hypotension: Secondary | ICD-10-CM

## 2016-11-20 DIAGNOSIS — Z950 Presence of cardiac pacemaker: Secondary | ICD-10-CM | POA: Diagnosis not present

## 2016-11-20 DIAGNOSIS — I255 Ischemic cardiomyopathy: Secondary | ICD-10-CM

## 2016-11-20 DIAGNOSIS — I472 Ventricular tachycardia, unspecified: Secondary | ICD-10-CM

## 2016-11-20 DIAGNOSIS — I25810 Atherosclerosis of coronary artery bypass graft(s) without angina pectoris: Secondary | ICD-10-CM

## 2016-11-20 DIAGNOSIS — Q230 Congenital stenosis of aortic valve: Secondary | ICD-10-CM | POA: Diagnosis not present

## 2016-11-20 DIAGNOSIS — Q231 Congenital insufficiency of aortic valve: Secondary | ICD-10-CM

## 2016-11-20 NOTE — Patient Instructions (Addendum)
Your physician has recommended you make the following change in your medication:  -- STOP twice weekly torsemide -- take torsemide daily as needed for weight gain of 3lbs in 24 hours or 5lbs in 1 week  Call our office if your weight increases 3lbs in 24 hours or 5lbs in 1 week  Monitor your blood pressure (BP) at home. Please check your BP no more than twice daily, after you have been sitting/resting for 5-10 minutes, at least 1 hour after taking your BP medications  Your physician recommends that you schedule a follow-up appointment in January 2019 with Dr. Sallyanne Kuster

## 2016-11-20 NOTE — Progress Notes (Signed)
Cardiology Office Note    Date:  11/22/2016   ID:  Adam [name], DOB [birth year], MRN [id number]  PCP:  Blanchie Serve, MD  Cardiologist:  Dr. Sallyanne Kuster  Chief Complaint  Patient presents with  . Follow-up    seen for Dr. Sallyanne Kuster    History of Present Illness:  Adam [name] is a 83 y.o. male with PMH of CAD s/p CABG in 1996, ICM, combined systolic and diastolic heart failure, orthostatic hypotension that was felt to be neurogenic in etiology, SSS s/p PPM 2010, mild dementia, and VT on amiodarone. He had placement of a stent to SVG to RCA in 2011. Her last cardiac catheterization in 2012 showed patent grafts but all native coronary arteries were occluded. Last Myoview in March 2017 showed EF 52%, medium defect of severe severity present in the basal inferior, basal inferolateral, mid inferior, and mid inferolateral location consistent with prior MI, no ischemia was seen. In June 2016, he was seen for near syncope, dyspnea and extreme fatigue, he did not have loss of consciousness. Interrogation of his pacemaker showed relatively slow ventricular tachycardia around 150 bpm which seems to be regular and the monomorphic. His amiodarone was increased and he has not had any significant ventricular tachycardia since. [facility]. In November 2017, he presented with the right lower extremity weakness and was diagnosed with ischemic stroke. He was prescribed eliquis due to suspicion of stroke related to atrial fibrillation, however later deviceinterrogation showed no signs of atrial fibrillation. He had a echocardiogram done at outside facility earlier this year that showed EF down to 35%, however he was asymptomatic without any chest discomfort or worsening shortness of breath, after discussing with Dr. Sallyanne Kuster, we all agreed he is not a good candidate for cardiac catheterization, therefore medical management.  He did not respond very well to increase medication to 10 mg 3 times a day.  This caused significant hypertension instead. Instead he take 10/5/5 mg midodrine. We have been trying to set the patient up with [name]. However was not successful with the first time. Last interrogation showed battery life of 3 month until ERI for his device. He will need monthly interrogation. Due to fluid retention likely related to the use of fludrocortisone he has been started on torsemide every other day. He was last seen by Dr. Sallyanne Kuster on 10/29/2016, the plan was to decrease the dose of fludrocortisone and gradually discontinue torsemide. In ordered to decrease ventricular pacing, his amiodarone was also cut back to 200 mg daily. He presents today with his wife and his daughter. He continued to have weakness, however no significant presyncope episode. He lives in [facility]. I will discontinue his twice-weekly dosing of 10 mg torsemide and change it to as needed. We have interrogated his device, he is ventricularly paced at about 69% of the time, unable to reduce the amiodarone dosage given his history of VT. His device voltage did reach ERI, pending decision by Dr. Sallyanne Kuster regarding timing of device change out. I did briefly discuss with the patient regarding the process of device change out.    Past Medical History:  Diagnosis Date  . Arthritis    "minor, back and sometimes knees" (12/15/2012)  . Bradycardia    AFib/SSS s/p [employer] PPM 04/12/2008  . CAD (coronary artery disease) 12/30/2014   CABG (LIMA-LAD, SVG-RCA, SVG-OM in 1996).  07/2009 BMS to SVG-RCA. Cath in 04/2010 with patent stents   . Cardiomyopathy, ischemic 08/25/2012  .  CHF (congestive heart failure) ([place])   . Chronic knee pain 12/03/2014  . Combined congestive systolic and diastolic heart failure ([place]) 02/02/2015   Hx EF 41%. BNP 96.8 02/21/15 Torsemide 04/06/15 Na 142, K 4.6, Bun 16, creat 0.89 04/20/14 BNP 111.7, Na 142, K 4.6, Bun 16, creat 0.9   . Depression with anxiety 02/02/2015   02/21/15 Hgb A1c 5.8  03/10/15 MMSE 30/30   . Dizziness, after diuretic asscoiated with hypotension and responded to fluid bolus 06/05/2011   04/28/15 US carotid R+L normal bilateral arterial velocities.    Marland Kitchen Dyspnea 08/11/2014   Followed in Pulmonary clinic/ [facility]/ [place]  - 08/11/2014  Walked RA x 1 laps @ 185 ft each stopped due to fatigue/off balance/ slow pace/  no sob or desat  - PFT's  09/26/2014  FEV1 2.26 (85 % ) ratio 76  p no % improvement from saba with DLCO  67 % corrects to 93 % for alv volume      Since prev study 08/04/13 minimal change lung vol or dlco    . Embolic cerebral infarction ([place]) 12/06/2015  . Exertional shortness of breath    "sometimes walking" (12/15/2012)  . GERD (gastroesophageal reflux disease)   . Gout 02/09/2015  . Heart murmur    "just told I had one today" (12/15/2012)  . Hiatal hernia   . Hyperlipidemia   . Hypertension   . Hypothyroid   . Influenza A 03/10/2016  . Insomnia   . Melanoma of back ([place]) 1976  . Myocardial infarction [facility]) 1996; 2011   "both silent" (12/15/2012)  . Nonrheumatic aortic valve stenosis   . Orthostatic hypotension   . Osteoporosis, senile   . Pacemaker   . RBBB   . Restless leg 02/02/2015  . Right leg weakness 12/06/2015  . [facility] spotted fever   . S/P CABG x 4   . Sick sinus syndrome ([place]) 01/31/2014  . Sustained ventricular tachycardia ([place]) 07/27/2014  . Urinary retention 12/30/2014    Past Surgical History:  Procedure Laterality Date  . CARDIAC CATHETERIZATION  04/2010   LIMA to LAD patent,SVG to OM patent,no in-stnet restenosis RCA  . CATARACT EXTRACTION W/ INTRAOCULAR LENS  IMPLANT, BILATERAL Bilateral 2012  . CORONARY ANGIOPLASTY WITH STENT PLACEMENT  07/2009   bare metal stent to SVG to the RCA  . CORONARY ARTERY BYPASS GRAFT  1996   LIMA to LAD,SVG to RCA & SVG to OM  . INSERT / REPLACE / REMOVE PACEMAKER  2010  . MELANOMA EXCISION  05/1974 X2   "taken off my back" (12/15/2012)  . NM MYOVIEW LTD  06/2011   low risk  .  TONSILLECTOMY  1938  . TRANSURETHRAL RESECTION OF PROSTATE  1986  . US ECHOCARDIOGRAPHY  07/11/2009   EF 45-50%    Current Medications: Outpatient Medications Prior to Visit  Medication Sig Dispense Refill  . acetaminophen (TYLENOL) 325 MG tablet Take 650 mg by mouth as needed.     Marland Kitchen amiodarone (PACERONE) 200 MG tablet Take 1 tablet (200 mg total) by mouth daily. 90 tablet 3  . aspirin EC 81 MG tablet Take 1 tablet (81 mg total) by mouth daily. 30 tablet 1  . cetirizine (ZYRTEC) 10 MG tablet Take 10 mg by mouth daily. 1 hour before bedtime    . Cholecalciferol (VITAMIN D3) 5000 UNITS CAPS Take 5,000 Units by mouth every Monday.     . clopidogrel (PLAVIX) 75 MG tablet Take 1 tablet (75 mg total) by mouth  daily. 30 tablet 2  . fludrocortisone (FLORINEF) 0.1 MG tablet Take 1 tablet (0.1 mg total) by mouth 4 (four) times a week. Tuesdays, Thursdays, Saturdays, and Sundays 60 tablet 3  . guaiFENesin (MUCINEX) 600 MG 12 hr tablet Take 600 mg by mouth 2 (two) times daily.     Marland Kitchen levothyroxine (SYNTHROID, LEVOTHROID) 137 MCG tablet Take 1 tablet (137 mcg total) by mouth daily before breakfast. 90 tablet 3  . midodrine (PROAMATINE) 5 MG tablet Take 2 tablets (10 mg total) by mouth 3 (three) times daily with meals. 180 tablet 3  . polyethylene glycol (MIRALAX / GLYCOLAX) packet Take 17 g by mouth as needed (if no bowel movement in 3 days).    . pravastatin (PRAVACHOL) 40 MG tablet Take 1 tablet (40 mg total) by mouth at bedtime. 90 tablet 3  . psyllium (METAMUCIL SMOOTH TEXTURE) 28 % packet Take 1 packet by mouth 2 (two) times daily.    Marland Kitchen rOPINIRole (REQUIP) 0.5 MG tablet Take 0.5 mg by mouth daily.     Marland Kitchen torsemide (DEMADEX) 10 MG tablet Take 1 tablet as needed for weight gain of 3lbs in 24 hours or 5lbs in 1 week    . traZODone (DESYREL) 50 MG tablet Take 50 mg by mouth at bedtime. May give an additional 25mg  1 hour later if unable to sleep as needed.     No facility-administered medications prior to  visit.      Allergies:   Altace [ramipril]; Crestor [rosuvastatin calcium]; Penicillins; and Sulfa antibiotics   Social History   Social History  . Marital status: Married    Spouse name: N/A  . Number of children: 2  . Years of education: Masters   Occupational History  . Retired Company secretary -Pensions consultant    Social History Main Topics  . Smoking status: Never Smoker  . Smokeless tobacco: Never Used  . Alcohol use No  . Drug use: No  . Sexual activity: No   Other Topics Concern  . None   Social History Narrative   Lives at [place] to [place] 01/09/15   Married - Violet   Never smoked   Alcohol none   Previously employed as Scientist, forensic for [employer].         Diet:Low sodium   Do you drink/eat things with caffeine? No   Marital status: Married                              What year were you married?1950   Do you live in a house, apartment, assisted living, condo, trailer, etc)?    Is it one or more stories? 1   How many persons live in your home? 2   Do you have any pets in your home? No   Current or past profession: Minister, [employer]   Do you exercise?     Very Little                                                 Type & how often:    Do you have a living will?  Yes   Do you have a DNR Form? Yes   Do you have a POA/HPOA forms? Yes     Family History:  The patient's family history includes Anuerysm  in his son; Coronary artery disease in his father and mother; Diabetes in his father and mother; Heart disease in his brother, father, and mother; Lung cancer in his father.   ROS:   Please see the history of present illness.    ROS All other systems reviewed and are negative.   PHYSICAL EXAM:   VS:  BP 122/78   Pulse 76   Ht 5\' 10"  (1.778 m)   Wt 175 lb 12.8 oz (79.7 kg)   BMI 25.22 kg/m    GEN: Well nourished, well developed, in no acute distress  HEENT: normal  Neck: no JVD, carotid bruits, or masses Cardiac: RRR;  no rubs, or gallops,no edema  3/6 systolic murmur Respiratory:  clear to auscultation bilaterally, normal work of breathing GI: soft, nontender, nondistended, + BS MS: no deformity or atrophy  Skin: warm and dry, no rash Neuro:  Alert and Oriented x 3, Strength and sensation are intact Psych: euthymic mood, full affect  Wt Readings from Last 3 Encounters:  11/20/16 175 lb 12.8 oz (79.7 kg)  10/29/16 174 lb (78.9 kg)  10/23/16 174 lb 6.4 oz (79.1 kg)      Studies/Labs Reviewed:   EKG:  EKG is not ordered today.    Recent Labs: 08/15/2016: ALT 21 09/17/2016: BUN 12; Creatinine, Ser 0.79; Hemoglobin 13.4; Platelets 228.0; Potassium 4.1; Pro B Natriuretic peptide (BNP) 721.0; Sodium 139; TSH 1.53   Lipid Panel    Component Value Date/Time   CHOL 84 12/07/2015 0449   TRIG 102 12/07/2015 0449   HDL 41 12/07/2015 0449   CHOLHDL 2.0 12/07/2015 0449   VLDL 20 12/07/2015 0449   LDLCALC 23 12/07/2015 0449    Additional studies/ records that were reviewed today include:   Cath 04/13/2010 CONCLUSION AND FINDINGS: 1. AO pressure 97/56. 2. Coronary. The native right coronary was 100% occluded in its origin and in its proximal portion. The left main bifurcated into LAD and circumflex. Left main was patent. The left circumflex was 100% occluded in its origin and in its proximal portion as was the LAD. Bypass graft to the LIMA, to the LAD was patent. There was retrograde flowing from the distal LAD to proximal LAD through the LIMA without difficulty. There is no significant disease. The SVG to the RCA was patent and mildly distended in its midportion of the body was patent. It caused retrograde filling of the native RCA branch with proximal portion above the 50% stenosis of the native artery prior to the bifurcation of PLA and PDA. Saphenous vein graft to OM was patent. The OM itself was patent all the way through. There was no significant disease.  FINAL CONCLUSION: There is patent  LIMA to LAD, patent SVG to OM, patent SVG to RCA, native vessel 100% occluded, LAD 100% occluded, circumflex 100% occluded, and 50% distal RCA.  PLAN: Medical therapy. There is nothing to intervene upon. He has low risk coronary anatomy and he will otherwise do well.   Myoview 04/12/2015 Study Highlights    Nuclear stress EF: 32%.  There was no ST segment deviation noted during stress.  Defect 1: There is a medium defect of severe severity present in the basal inferior, basal inferolateral, mid inferior and mid inferolateral location.  Findings consistent with prior myocardial infarction.  This is an intermediate risk study.  The left ventricular ejection fraction is moderately decreased (30-44%).  Akinesis of the basal and mid inferior and inferolateral walls and paradoxical septal motion.  There is a  sacr in the basal and mid inferior and inferolateral leads with associated akinesis. No ischemia.     Echo 01/12/2016 LV EF: 45% - 50%  Study Conclusions  - Left ventricle: The cavity size was normal. There was moderate focal basal hypertrophy. Systolic function was mildly reduced. The estimated ejection fraction was in the range of 45% to 50%. There is akinesis of the basal-midinferolateral and inferior myocardium. The study is not technically sufficient to allow evaluation of LV diastolic function. - Aortic valve: Possibly bicuspid. Moderate diffuse thickening and calcification. Valve mobility was restricted. There was moderate to severe stenosis. There was trivial regurgitation. Valve area (VTI): 1.09 cm^2. Valve area (Vmax): 1.01 cm^2. Valve area (Vmean): 0.97 cm^2. - Mitral valve: Mild focal calcification of the anterior leaflet. There was mild regurgitation. - Left atrium: The atrium was mildly dilated.     ASSESSMENT:    1. Orthostatic hypotension   2. Coronary artery disease involving coronary bypass graft of native heart  without angina pectoris   3. Ischemic cardiomyopathy   4. Chronic combined systolic and diastolic heart failure ([place])   5. Pacemaker   6. Ventricular tachycardia ([place])   7. Aortic stenosis with bicuspid valve      PLAN:  In order of problems listed above:  1. Orthostatic hypotension: Continued to be symptomatic despite on Midrin and fludrocortisone, he is in the process of obtaining [name].   2. Ischemic cardiomyopathy: Previous echocardiogram in December 2017 showed EF 45%, however recent echocardiogram obtained at outside facility showed EF down to 35%. He is a poor candidate for extensive invasive workup.  3. Chronic combined systolic and diastolic heart failure: Euvolemic on physical exam, decreased torsemide from 10 mg twice weekly dosing to as needed  4. History of VT on amiodarone: Amiodarone dosage was decreased recently to 200 mg daily  5. SSS s/p PPM: He has reached ERI based on device voltage on interrogation, Dr. Sallyanne Kuster to review. Unable to reduce amiodarone any further give a history of DVT.  6. Moderate to severe aortic stenosis with bicuspid valve: Not a surgical candidate.    Medication Adjustments/Labs and Tests Ordered: Current medicines are reviewed at length with the patient today.  Concerns regarding medicines are outlined above.  Medication changes, Labs and Tests ordered today are listed in the Patient Instructions below. Patient Instructions  Your physician has recommended you make the following change in your medication:  -- STOP twice weekly torsemide -- take torsemide daily as needed for weight gain of 3lbs in 24 hours or 5lbs in 1 week  Call our office if your weight increases 3lbs in 24 hours or 5lbs in 1 week  Monitor your blood pressure (BP) at home. Please check your BP no more than twice daily, after you have been sitting/resting for 5-10 minutes, at least 1 hour after taking your BP medications  Your physician recommends that you schedule a  follow-up appointment in January 2019 with Dr. Sallyanne Kuster     Signed, Almyra Deforest, [place]  11/22/2016 11:07 AM    [address], [place], [place]  [address] Phone: [phone]; Fax: [phone]

## 2016-11-20 NOTE — Telephone Encounter (Signed)
Talked to [employer] [employer] [facility] manager) at [place] - Medication approved but amount exceed plan limits  Need authorization for > 90 capsules per month  *Prior-auth for plan limitation also completed today as with [facility] as expedite request*    Response in 24 hrs

## 2016-11-20 NOTE — Telephone Encounter (Signed)
Please call [facility] once the Plan limitation authorization completed.

## 2016-11-22 ENCOUNTER — Encounter: Payer: Self-pay | Admitting: Physician Assistant

## 2016-11-23 ENCOUNTER — Other Ambulatory Visit: Payer: Self-pay | Admitting: Internal Medicine

## 2016-11-27 ENCOUNTER — Ambulatory Visit (INDEPENDENT_AMBULATORY_CARE_PROVIDER_SITE_OTHER)
Admission: RE | Admit: 2016-11-27 | Discharge: 2016-11-27 | Disposition: A | Discharge status: Home / Self Care | Payer: Medicare Other | Source: Ambulatory Visit | Attending: Internal Medicine | Admitting: Internal Medicine

## 2016-11-27 ENCOUNTER — Other Ambulatory Visit: Payer: Medicare Other

## 2016-11-27 ENCOUNTER — Ambulatory Visit (INDEPENDENT_AMBULATORY_CARE_PROVIDER_SITE_OTHER): Payer: Medicare Other | Admitting: Internal Medicine

## 2016-11-27 ENCOUNTER — Encounter: Payer: Self-pay | Admitting: Internal Medicine

## 2016-11-27 VITALS — BP 132/72 | HR 74 | Ht 70.0 in | Wt 179.8 lb

## 2016-11-27 DIAGNOSIS — R0609 Other forms of dyspnea: Secondary | ICD-10-CM

## 2016-11-27 DIAGNOSIS — R05 Cough: Secondary | ICD-10-CM

## 2016-11-27 DIAGNOSIS — R0600 Dyspnea, unspecified: Secondary | ICD-10-CM

## 2016-11-27 DIAGNOSIS — R058 Other specified cough: Secondary | ICD-10-CM

## 2016-11-27 NOTE — Patient Instructions (Signed)
Pulmonary follow up is as needed for worse cough or breathing

## 2016-11-27 NOTE — Progress Notes (Signed)
Subjective:    Patient ID: Adam [name], male    DOB: Jul 17, 1926,     MRN: [id number]    Brief patient profile:  41 yowm never smoker new onset cough summer of 2015 with some bloody mucus/ turned yellow p  zpak esp in am's then bloody again x end of May  2016 > [name] referred 07/14/2014 to pulmonary clinic    History of Present Illness  07/14/2014 1st [name] office visit/ [name]   Chief Complaint  Patient presents with  . Pulmonary Consult    Referred by [name].  Pt c/o cough for the past year or so- prod in the am with yellow sputum. He has had blood streaked sputum for the past 3 wks.  He feels weak in general and voice has changed. He also has had night sweats.   pt has baseline gerd / large HH on ppi with bfast daily but most severe coughing is before bfast, not really much worse x one year but now blood streaked- no epistaxis/ on asa and plavix  rec Please see patient coordinator before you leave today  to schedule sinus CT > neg  Pantoprazole (protonix) 40 mg   Take  30-60 min before first meal of the day and Pepcid ac (famotidine)  20 mg one @  bedtime until return to office - this is the best way to tell whether stomach acid is contributing to your problem.   GERD diet     07/26/14 started on Amiodarone     09/17/2016  f/u ov/[name] re: worse cough / sob no 02/ still on amiodarone  And referred back to [facility] clinic by [name] who noted crackles on exam  Chief Complaint  Patient presents with  . Acute Visit    pt c/o worsening sometimes prod cough with yellow mucus X1 year.  Does note some chest tightness.    cough is mostly hs but also daytime not related to eating or outdoors - some assoc nasal congestion  Doe x 50 fts worse x 1 years also  On florinef for orthostatic symptoms with leg swelling  Having to sleep in recliner due to cough x 1 year,  45 degrees helps  rec Make sure you take your protonix 40 mg Take 30- 60 min before your first and last  meals of the day  Add zyrtec 10 mg at least  One hour before bedtime   GERD eit . Please schedule a follow up office visit in 4 weeks, sooner if needed  Rec: double dose of demadex    10/16/2016  f/u ov/Davaris Youtsey re:  Sob/cough ? Amiodarone toxcity vs chf /  Chief Complaint  Patient presents with  . Follow-up    Cough and SOB have improved some, but have not resolved. His cough is more prod and sputum is darker in color, brown. He has also noticed night sweats off and off since the last visit.   some nasal and chest congestion x sev weeks/with purulent discharge and same observed color  changes in suputum/ pcn allergy= rash Able to lie down on one pillow comfortably Leg swelling better on higher doses of demadex and breathing seemed to improve as swelling down  rec Be sure pantoprazole (protonix) is Take 30- 60 min before your first and last meals of the day  omnicef 300 mg twice daily x 10 days > changed to doxy  Prednisone 10 mg take  4 each am x 2 days,  2 each am x 2 days,  1 each am x 2 days and stop     11/27/2016  f/u ov/[name] re:  Cough improved  Chief Complaint  Patient presents with  . Follow-up    Cough and SOB have improved some. He is occ coughing up some clear sputum.  He has not had any other night sweats.    sleeps on one pillow ok/ no am excess mucu  Walks to DR and back at [facility] s stopping s 02 /really all smiles at this point   No obvious day to day or daytime variability or assoc excess/ purulent sputum or mucus plugs or hemoptysis or cp or chest tightness, subjective wheeze or overt sinus or hb symptoms. No unusual exp hx or h/o childhood pna/ asthma or knowledge of premature birth.  Sleeping ok flat without nocturnal  or early am exacerbation  of respiratory  c/o's or need for noct saba. Also denies any obvious fluctuation of symptoms with weather or environmental changes or other aggravating or alleviating factors except as outlined above   Current  Allergies, Complete Past Medical History, Past Surgical History, Family History, and Social History were reviewed in [employer] record.  ROS  The following are not active complaints unless bolded Hoarseness, sore throat, dysphagia, dental problems, itching, sneezing,  nasal congestion or discharge of excess mucus or purulent secretions, ear ache,   fever, chills, sweats, unintended wt loss or wt gain, classically pleuritic or exertional cp,  orthopnea pnd or leg swelling, presyncope, palpitations, abdominal pain, anorexia, nausea, vomiting, diarrhea  or change in bowel habits or change in bladder habits, change in stools or change in urine, dysuria, hematuria,  rash, arthralgias, visual complaints, headache, numbness, weakness or ataxia or problems with walking or coordination,  change in mood/affect or memory.        Current Meds  Medication Sig  . acetaminophen (TYLENOL) 325 MG tablet Take 650 mg by mouth as needed.   Marland Kitchen amiodarone (PACERONE) 200 MG tablet Take 1 tablet (200 mg total) by mouth daily.  Marland Kitchen aspirin EC 81 MG tablet Take 1 tablet (81 mg total) by mouth daily.  . cetirizine (ZYRTEC) 10 MG tablet Take 10 mg by mouth daily. 1 hour before bedtime  . Cholecalciferol (VITAMIN D3) 5000 UNITS CAPS Take 5,000 Units by mouth every Monday.   . clopidogrel (PLAVIX) 75 MG tablet TAKE 1 TABLET ONCE DAILY.  . fludrocortisone (FLORINEF) 0.1 MG tablet Take 1 tablet (0.1 mg total) by mouth 4 (four) times a week. Tuesdays, Thursdays, Saturdays, and Sundays  . guaiFENesin (MUCINEX) 600 MG 12 hr tablet Take 600 mg by mouth 2 (two) times daily.   Marland Kitchen levothyroxine (SYNTHROID, LEVOTHROID) 137 MCG tablet Take 1 tablet (137 mcg total) by mouth daily before breakfast.  . midodrine (PROAMATINE) 5 MG tablet Take 2 tablets (10 mg total) by mouth 3 (three) times daily with meals.  . pantoprazole (PROTONIX) 20 MG tablet Take 20 mg by mouth 2 (two) times daily before a meal.  . polyethylene  glycol (MIRALAX / GLYCOLAX) packet Take 17 g by mouth as needed (if no bowel movement in 3 days).  . pravastatin (PRAVACHOL) 40 MG tablet Take 1 tablet (40 mg total) by mouth at bedtime.  . psyllium (METAMUCIL SMOOTH TEXTURE) 28 % packet Take 1 packet by mouth 2 (two) times daily.  Marland Kitchen rOPINIRole (REQUIP) 0.5 MG tablet Take 0.5 mg by mouth daily.   Marland Kitchen torsemide (DEMADEX) 10 MG tablet Take  1 tablet as needed for weight gain of 3lbs in 24 hours or 5lbs in 1 week  . traZODone (DESYREL) 50 MG tablet Take 50 mg by mouth at bedtime. May give an additional 25mg  1 hour later if unable to sleep as needed.                          Objective:   Physical Exam   amb wm nad / moderately hoarse   Using rollator / all smiles, quite robust    08/11/2014          181 > 09/26/2014    180 > 09/17/2016   182 > 10/16/2016   171 > 11/27/2016  179   Wt Readings from Last 3 Encounters:  07/14/14 180 lb (81.647 kg)  01/29/14 187 lb 3.2 oz (84.913 kg)  10/12/13 182 lb 1.6 oz (82.6 kg)    Vital signs reviewed  - Note on arrival 02 sats  95% on RA     HEENT: nl   turbinates, and orophanx. Nl external ear canals without cough reflex - missing all bottom teeth and most of upper   NECK :  without JVD/Nodes/TM/ nl carotid upstrokes bilaterally   LUNGS: no acc muscle use, nl contour with minimal insp crackles bilaterally/ no cough on inps   CV:  RRR  no s3   II/VI SEM / no increase in P2,  With elastic hose and R> L 1-2+  pitting edema   ABD:  soft and nontender with nl excursion in the supine position. No bruits or organomegaly, bowel sounds nl  MS:  warm without deformities, calf tenderness, cyanosis or clubbing  SKIN: warm and dry without lesions    NEURO:  alert, approp, no deficits      CXR PA and Lateral:   11/27/2016 :    I personally reviewed images and agree with radiology impression as follows:    1. No radiographic evidence of acute cardiopulmonary disease. 2. Coarse interstitial markings  throughout the mid to lower lungs bilaterally, similar to prior studies  3. Cardiomegaly. 4. Large hiatal hernia.     Lab Results  Component Value Date   ESRSEDRATE 21 (H) 09/17/2016   ESRSEDRATE 37 (H) 08/11/2014                   Assessment & Plan:

## 2016-11-29 ENCOUNTER — Encounter: Payer: Self-pay | Admitting: Internal Medicine

## 2016-11-29 NOTE — Assessment & Plan Note (Addendum)
-   h/o acei intolerance  - allergy profile 07/14/2014 >  Eos 0.2 / IgE 16 with neg RAST - sinus CT 07/21/2014 >>There is minimal mucosal thickening in the inferior right maxillary sinus which has a chronic appearance. No air-fluid levels or evidence of acute sinusitis - Huge HH on cxr  08/11/2014  - challenge with zyrtec 10 mg each pm 09/17/2016 >>>   Resolved p rx for acute bronchitis so has adequate control on present rx, reviewed in detail with pt > no change in rx needed  / f/u prn

## 2016-11-29 NOTE — Assessment & Plan Note (Addendum)
07/26/14 started on Amiodarone  - 08/11/2014  Walked RA x 1 laps @ 185 ft each stopped due to fatigue/off balance/ slow pace/  no sob or desat  - PFT's  09/26/2014  FEV1 2.26 (85 % ) ratio 76  p no % improvement from saba with DLCO  67 % corrects to 93 % for alv volume      Since prev study 08/04/13 minimal change lung vol or dlco  09/17/2016   Walked RA x one lap @ 185 stopped due to  Sob, no desat, slow pace with walker, no desat   I had an extended final summary discussion with the patient/wife  reviewing all relevant studies completed to date and  lasting 15 to 20 minutes of a 25 minute visit on the following issues:       Although I continue to be concerned re occult amio toxicity, sob seems to correlate better with vol status and has improved since prev ov back to baseline p short course of abx/ pred for apparent acute bronchitis.    As regards to Amio, patients typically have been on amiodarone for 6-12 months before this complication manifests.  Of note, serial clinical evaluation for symptoms such as cough dyspnea or fevers is  the preferred method of monitoring for pulmonary toxicity because a decrease in DLCO or lung volumes is a nonspecific for toxicity. Pathologically amiodarone pulmonary toxicity may appear as interstitial pneumonitis, eosinophilic pneumonia, organizing pneumonia, pulmonary fibrosis or less commonly as diffuse alveolar hemorrhage, pulmonary nodules or pleural effusions.  Risk factors for pulmonary toxicity include age greater than 30, daily dose greater than equal to 400 mg, a high cumulative dose, or pre-existing lung disease which one could argue does not include pulmonary edema as it is not primarily a lung dz  - but in any case he has 2-3/4 risk factors and needs to be monitored for worsening doe/ cough or desats with exertion none of which apply now.   Pulmonary f/u can be prn

## 2016-12-02 ENCOUNTER — Telehealth: Payer: Self-pay | Admitting: Cardiovascular Disease

## 2016-12-02 NOTE — Telephone Encounter (Signed)
Device is not quite ready for changeout yet - still could reach that indication in next few weeks.

## 2016-12-02 NOTE — Telephone Encounter (Signed)
Wife informed and verbalized understanding

## 2016-12-02 NOTE — Telephone Encounter (Signed)
Returned call to daughter, Caryl Asp. Notified her that her father's device hasn't quite reached ERI (elective replacement interval). Advised that we will schedule him an appointment in the device clinic for a device check in 1 month. Daughter verbalized understanding and agreed with plan.

## 2016-12-02 NOTE — Telephone Encounter (Signed)
Adam Prude L, LPN - 8:18 PM   PER DR C NOTED DATED 10-29-16:Current device interrogation: St. Jude accent DR RF device is functioning normally and has an estimated generator longevity of about 3 months. Lead parameters remain normal. He has 99% atrial pacing and there does not appear to be any underlying atrial rhythm. He also has 70% ventricular pacing. His pacemaker has not shown recent VT, and it has never recorded atrial fibrillation. He is very sedentary and, as expected, heart rate histogram distribution is very blunted. We'll try once more to reduce his dose of amiodarone today, to reduce the frequency of ventricular pacing       Adam [name] C - 1:57 PM   Follow up     Daughter Adam [name] calling wants a explanation as to why her Dad pacemaker is not been replace by end of year.

## 2016-12-02 NOTE — Telephone Encounter (Signed)
Follow up     Daughter Adam [name] calling wants a explanation as to why her Dad pacemaker is not been replace by end of year.

## 2016-12-02 NOTE — Telephone Encounter (Signed)
New message    Patient spouse calling to get clarification on " battery change". States Dr Sallyanne Kuster told them it had to be done before Thanksgiving. Spouse did not want to schedule pacer check appointment until clarification was given. Please call

## 2016-12-02 NOTE — Telephone Encounter (Signed)
Spoke with wife and she said that she was told that patient's device would be changed by Thanksgiving. Chart reviewed and per last office note, patient ERI is 3 months from 10/29/16 interrogation. Wife advised that message would be sent to Dr. Sallyanne Kuster and his nurse for clarification. Wife verbalized understanding.

## 2016-12-02 NOTE — Telephone Encounter (Signed)
See telephone encounter from 12/02/17.

## 2016-12-02 NOTE — Telephone Encounter (Signed)
PER DR C NOTED DATED 10-29-16:Current device interrogation: St. Jude accent DR RF device is functioning normally and has an estimated generator longevity of about 3 months. Lead parameters remain normal. He has 99% atrial pacing and there does not appear to be any underlying atrial rhythm. He also has 70% ventricular pacing. His pacemaker has not shown recent VT, and it has never recorded atrial fibrillation. He is very sedentary and, as expected, heart rate histogram distribution is very blunted. We'll try once more to reduce his dose of amiodarone today, to reduce the frequency of ventricular pacing

## 2016-12-03 ENCOUNTER — Telehealth: Payer: Self-pay | Admitting: *Deleted

## 2016-12-03 NOTE — Telephone Encounter (Signed)
Patient called and stated that he is a patient of Dr. Bubba Camp and wants [place] to call him ASAP. Please call [phone]

## 2016-12-04 ENCOUNTER — Telehealth: Payer: Self-pay | Admitting: Cardiovascular Disease

## 2016-12-04 ENCOUNTER — Telehealth: Payer: Self-pay | Admitting: Physician Assistant

## 2016-12-04 NOTE — Telephone Encounter (Signed)
Tried to call Mrs Adam [name]. No response. Called her HCPOA- daughter Adam [name]. Patient would like his grand-daughter to be provided with an excuse from work letter to come visit him. She plans to visit him in November. He was initially scheduled for pacemaker change in November but now this has moved to January. He would still like a letter for excuse from work in November be provided. Discussed with daughter that since pt not undergoing any medical procedure in November, his family member cannot be provided with a leave of absence request letter mentioning a medical need. Daughter voices understanding this and agrees with it. She will talk to patient and his wife to help them understand the situation.

## 2016-12-04 NOTE — Telephone Encounter (Signed)
Patient called and left message on clinical intake requesting [place] return please

## 2016-12-04 NOTE — Telephone Encounter (Signed)
Needs prior authorization  for [name]

## 2016-12-04 NOTE — Telephone Encounter (Signed)
I do not see this on his medication list

## 2016-12-04 NOTE — Telephone Encounter (Signed)
Please call,concerning pt,Adam [name].

## 2016-12-04 NOTE — Telephone Encounter (Signed)
Spoke with Juliann Pulse, all questions answered

## 2016-12-04 NOTE — Telephone Encounter (Signed)
Patient's wife called today requesting to speak with or see [place] to deliver some paperwork.  I called [place] at [facility] and informed her of patient/wife request to speak with her. [place] indicated that she will follow-up with patient.  [place] please document conversation/interaction

## 2016-12-04 NOTE — Telephone Encounter (Signed)
Addressed.

## 2016-12-09 ENCOUNTER — Encounter: Payer: Self-pay | Admitting: Internal Medicine

## 2016-12-09 ENCOUNTER — Non-Acute Institutional Stay: Payer: Medicare Other | Admitting: Internal Medicine

## 2016-12-09 VITALS — BP 130/62 | HR 72 | Temp 98.1°F | Resp 16 | Ht 70.0 in | Wt 183.0 lb

## 2016-12-09 DIAGNOSIS — I951 Orthostatic hypotension: Secondary | ICD-10-CM

## 2016-12-09 DIAGNOSIS — I5043 Acute on chronic combined systolic (congestive) and diastolic (congestive) heart failure: Secondary | ICD-10-CM | POA: Diagnosis not present

## 2016-12-09 DIAGNOSIS — R635 Abnormal weight gain: Secondary | ICD-10-CM

## 2016-12-09 NOTE — Progress Notes (Signed)
[facility]  Provider: Blanchie Serve MD   Location:  [place] of Service:  Clinic (12)  PCP: Blanchie Serve, MD Patient Care Team: Blanchie Serve, MD as PCP - General (Internal Medicine) Croitoru, Dani Gobble, MD as Attending Physician (Cardiology) Vevelyn Royals, MD as Consulting Physician (Ophthalmology) Franchot Gallo, MD as Consulting Physician (Urology) Tanda Rockers, MD as Consulting Physician (Pulmonary Disease) Allyn Kenner, MD (Dermatology) Deliah Goody, PA-C as Physician Assistant (Physician Assistant) Ngetich, Nelda Bucks, NP as Nurse Practitioner (Family Medicine)  Extended Emergency Contact Information Primary Emergency Contact: Kozakiewicz,[name] Address: [address]          [place] Phone: [phone] Mobile Phone: [phone] Relation: Spouse Secondary Emergency Contact: [name],Borboa  Johnnette Litter of [place] Phone: [phone] Mobile Phone: [phone] Relation: Daughter  Goals of Care: Advanced Directive information Advanced Directives 10/02/2016  Does Patient Have a Medical Advance Directive? Yes  Type of Advance Directive Living will;Healthcare Power of Attorney  Does patient want to make changes to medical advance directive? No - Patient declined  Copy of [place] in Chart? Yes  Would patient like information on creating a medical advance directive? -  Pre-existing out of facility DNR order (yellow form or pink MOST form) -      Chief Complaint  Patient presents with  . Acute Visit    elevated BP, leg edema and trouble breathing.   . Medication Refill    No refills needed at this time.     HPI: Patient is a 83 y.o. male seen today for acute visit. Here with his wife  Elevated BP reading- has chronic hypotension and is on florinef and midodrine. On review of home BP reading one reading with SBP in 150s and another with 140s. Otherwise reading  between 90-130s. No fall reported. Denies headache. Occasional dizziness present.  Dyspnea- worsened over last few days. Has noticed swelling to have increased in his legs. Dry cough. Wife mentions weight to be stable. Not taking torsemide as they feel there has not been significant weight gain.   Past Medical History:  Diagnosis Date  . Arthritis    "minor, back and sometimes knees" (12/15/2012)  . Bradycardia    AFib/SSS s/p St Jude PPM 04/12/2008  . CAD (coronary artery disease) 12/30/2014   CABG (LIMA-LAD, SVG-RCA, SVG-OM in 1996).  07/2009 BMS to SVG-RCA. Cath in 04/2010 with patent stents   . Cardiomyopathy, ischemic 08/25/2012  . CHF (congestive heart failure) ([place])   . Chronic knee pain 12/03/2014  . Combined congestive systolic and diastolic heart failure ([place]) 02/02/2015   Hx EF 41%. BNP 96.8 02/21/15 Torsemide 04/06/15 Na 142, K 4.6, Bun 16, creat 0.89 04/20/14 BNP 111.7, Na 142, K 4.6, Bun 16, creat 0.9   . Depression with anxiety 02/02/2015   02/21/15 Hgb A1c 5.8 03/10/15 MMSE 30/30   . Dizziness, after diuretic asscoiated with hypotension and responded to fluid bolus 06/05/2011   04/28/15 US carotid R+L normal bilateral arterial velocities.    Marland Kitchen Dyspnea 08/11/2014   Followed in Pulmonary clinic/ [facility]/ [place]  - 08/11/2014  Walked RA x 1 laps @ 185 ft each stopped due to fatigue/off balance/ slow pace/  no sob or desat  - PFT's  09/26/2014  FEV1 2.26 (85 % ) ratio 76  p no % improvement from saba with DLCO  67 % corrects to 93 % for alv volume  Since prev study 08/04/13 minimal change lung vol or dlco    . Embolic cerebral infarction ([place]) 12/06/2015  . Exertional shortness of breath    "sometimes walking" (12/15/2012)  . GERD (gastroesophageal reflux disease)   . Gout 02/09/2015  . Heart murmur    "just told I had one today" (12/15/2012)  . Hiatal hernia   . Hyperlipidemia   . Hypertension   . Hypothyroid   . Influenza A 03/10/2016  . Insomnia   . Melanoma of back ([place]) 1976   . Myocardial infarction [facility]) 1996; 2011   "both silent" (12/15/2012)  . Nonrheumatic aortic valve stenosis   . Orthostatic hypotension   . Osteoporosis, senile   . Pacemaker   . RBBB   . Restless leg 02/02/2015  . Right leg weakness 12/06/2015  . [facility] spotted fever   . S/P CABG x 4   . Sick sinus syndrome ([place]) 01/31/2014  . Sustained ventricular tachycardia ([place]) 07/27/2014  . Urinary retention 12/30/2014   Past Surgical History:  Procedure Laterality Date  . CARDIAC CATHETERIZATION  04/2010   LIMA to LAD patent,SVG to OM patent,no in-stnet restenosis RCA  . CATARACT EXTRACTION W/ INTRAOCULAR LENS  IMPLANT, BILATERAL Bilateral 2012  . CORONARY ANGIOPLASTY WITH STENT PLACEMENT  07/2009   bare metal stent to SVG to the RCA  . CORONARY ARTERY BYPASS GRAFT  1996   LIMA to LAD,SVG to RCA & SVG to OM  . INSERT / REPLACE / REMOVE PACEMAKER  2010  . MELANOMA EXCISION  05/1974 X2   "taken off my back" (12/15/2012)  . NM MYOVIEW LTD  06/2011   low risk  . TONSILLECTOMY  1938  . TRANSURETHRAL RESECTION OF PROSTATE  1986  . US ECHOCARDIOGRAPHY  07/11/2009   EF 45-50%    reports that  has never smoked. he has never used smokeless tobacco. He reports that he does not drink alcohol or use drugs. Social History   Socioeconomic History  . Marital status: Married    Spouse name: Not on file  . Number of children: 2  . Years of education: Masters  . Highest education level: Not on file  Social Needs  . Financial resource strain: Not on file  . Food insecurity - worry: Not on file  . Food insecurity - inability: Not on file  . Transportation needs - medical: Not on file  . Transportation needs - non-medical: Not on file  Occupational History  . Occupation: Retired Company secretary -Pensions consultant  Tobacco Use  . Smoking status: Never Smoker  . Smokeless tobacco: Never Used  Substance and Sexual Activity  . Alcohol use: No    Alcohol/week: 0.0 oz  . Drug use: No  . Sexual  activity: No  Other Topics Concern  . Not on file  Social History Narrative   Lives at [place] to [place] 01/09/15   Married - Violet   Never smoked   Alcohol none   Previously employed as Scientist, forensic for [employer].         Diet:Low sodium   Do you drink/eat things with caffeine? No   Marital status: Married                              What year were you married?1950   Do you live in a house, apartment, assisted living, condo, trailer, etc)?    Is it one or more stories? 1  How many persons live in your home? 2   Do you have any pets in your home? No   Current or past profession: Minister, [employer]iore[employer]   Do you exercise?     Very Little                                                 Type & how often:    Do you have a living will?  Yes   Do you have a DNR Form? Yes   Do you have a POA/HPOA forms? Yes     Family History  Problem Relation Age of Onset  . Coronary artery disease Mother   . Diabetes Mother   . Heart disease Mother   . Coronary artery disease Father   . Diabetes Father   . Lung cancer Father   . Heart disease Father   . Anuerysm Son   . Heart disease Brother     Health Maintenance  Topic Date Due  . PNA vac Low Risk Adult (2 of 2 - PCV13) 02/04/2017 (Originally 10/06/2010)  . TETANUS/TDAP  08/01/2026 (Originally 08/01/2024)  . DEXA SCAN  12/14/2024  . INFLUENZA VACCINE  Completed    Allergies  Allergen Reactions  . Altace [Ramipril] Cough  . Crestor [Rosuvastatin Calcium] Rash  . Penicillins Rash and Other (See Comments)    Has patient had a PCN reaction causing immediate rash, facial/tongue/throat swelling, SOB or lightheadedness with hypotension: No Has patient had a PCN reaction causing severe rash involving mucus membranes or skin necrosis: Yes Has patient had a PCN reaction that required hospitalization Yes Has patient had a PCN reaction occurring within the last 10 years: No If all of the above answers are "NO",  then may proceed with Cephalosporin use.   . Sulfa Antibiotics Rash    Outpatient Encounter Medications as of 12/09/2016  Medication Sig  . acetaminophen (TYLENOL) 325 MG tablet Take 325 mg as needed by mouth.   Marland Kitchen amiodarone (PACERONE) 200 MG tablet Take 1 tablet (200 mg total) by mouth daily.  Marland Kitchen aspirin EC 81 MG tablet Take 1 tablet (81 mg total) by mouth daily.  . Cholecalciferol (VITAMIN D3) 5000 UNITS CAPS Take 5,000 Units by mouth every Monday.   . clopidogrel (PLAVIX) 75 MG tablet TAKE 1 TABLET ONCE DAILY.  . fludrocortisone (FLORINEF) 0.1 MG tablet Take 1 tablet (0.1 mg total) by mouth 4 (four) times a week. Tuesdays, Thursdays, Saturdays, and Sundays  . guaiFENesin (MUCINEX) 600 MG 12 hr tablet Take 600 mg by mouth 2 (two) times daily.   Marland Kitchen levothyroxine (SYNTHROID, LEVOTHROID) 137 MCG tablet Take 1 tablet (137 mcg total) by mouth daily before breakfast.  . midodrine (PROAMATINE) 5 MG tablet Take 2 tablets (10 mg total) by mouth 3 (three) times daily with meals.  . pantoprazole (PROTONIX) 20 MG tablet Take 20 mg by mouth 2 (two) times daily before a meal.  . polyethylene glycol (MIRALAX / GLYCOLAX) packet Take 17 g by mouth as needed (if no bowel movement in 3 days).  . pravastatin (PRAVACHOL) 40 MG tablet Take 1 tablet (40 mg total) by mouth at bedtime.  . psyllium (METAMUCIL SMOOTH TEXTURE) 28 % packet Take 1 packet as needed by mouth.   Marland Kitchen rOPINIRole (REQUIP) 0.5 MG tablet Take 0.5 mg as needed by mouth.   . torsemide (DEMADEX) 10  MG tablet Take 1 tablet daily for now for 2 weeks and then daily as needed for weight gain of 3lbs in 24 hours or 5lbs in 1 week  . traZODone (DESYREL) 50 MG tablet Take 50 mg by mouth at bedtime. May give an additional 40m 1 hour later if unable to sleep as needed.  . cetirizine (ZYRTEC) 10 MG tablet Take 10 mg by mouth daily. 1 hour before bedtime   No facility-administered encounter medications on file as of 12/09/2016.     Review of Systems    Respiratory: Positive for cough and shortness of breath.        Chronic dry cough  Cardiovascular: Positive for leg swelling. Negative for chest pain.       Increase in swelling over last 4-5 days.   Genitourinary: Negative for dysuria.       Has foley catheter. Good urine output per wife.   Musculoskeletal: Positive for gait problem.       No fall reported, using his walker  Skin: Negative for rash and wound.  Neurological: Positive for dizziness. Negative for seizures, syncope and headaches.       Gets tired easily  Psychiatric/Behavioral: Positive for confusion. Negative for behavioral problems.    Vitals:   12/09/16 1557  BP: 130/62  Pulse: 72  Resp: 16  Temp: 98.1 F (36.7 C)  TempSrc: Tympanic  SpO2: 99%  Weight: 183 lb (83 kg)  Height: '5\' 10"'  (1.778 m)   Body mass index is 26.26 kg/m.   Wt Readings from Last 3 Encounters:  12/09/16 183 lb (83 kg)  11/27/16 179 lb 12.8 oz (81.6 kg)  11/20/16 175 lb 12.8 oz (79.7 kg)   Physical Exam  Constitutional: He appears well-developed and well-nourished. No distress.  HENT:  Head: Normocephalic and atraumatic.  Mouth/Throat: Oropharynx is clear and moist.  Eyes: Conjunctivae and EOM are normal. Pupils are equal, round, and reactive to light.  Neck: Neck supple.  Cardiovascular: Normal rate and regular rhythm.  Murmur heard. Pulmonary/Chest: Effort normal. No respiratory distress. He has rales.  Abdominal: Soft. Bowel sounds are normal. There is no tenderness.  Musculoskeletal: He exhibits edema.  1+ pitting edema. Can move all 4 extremities, uses walker  Lymphadenopathy:    He has no cervical adenopathy.  Skin: Skin is warm and dry. He is not diaphoretic.  Psychiatric: He has a normal mood and affect.    Labs reviewed: Basic Metabolic Panel: Recent Labs    03/11/16 0436  08/01/16 0022 08/15/16 09/17/16 1517  NA 139   < > 137 141 139  K 3.7   < > 4.1 3.7 4.1  CL 108  --  105  --  106  CO2 25  --  27  --  28   GLUCOSE 105*  --  104*  --  107*  BUN 18   < > '15 14 12  ' CREATININE 0.72   < > 1.00 0.9 0.79  CALCIUM 8.0*  --  8.5*  --  8.6   < > = values in this interval not displayed.   Liver Function Tests: Recent Labs    01/10/16 1400 03/10/16 1443 03/11/16 0436 04/03/16 05/08/16 08/15/16  AST 25 53* 46* '15 20 22  ' ALT '22 26 23 14 19 21  ' ALKPHOS 68 61 51 60 78 62  BILITOT 0.4 0.6 0.4  --   --   --   PROT 6.4* 6.9 5.8*  --   --   --  ALBUMIN 3.5 3.7 3.1*  --   --   --    No results for input(s): LIPASE, AMYLASE in the last 8760 hours. No results for input(s): AMMONIA in the last 8760 hours. CBC: Recent Labs    03/10/16 1443  07/31/16 1359 08/01/16 0022 08/06/16 1641 08/15/16 09/17/16 1517  WBC 8.3   < > CANCELED 6.9 7.0 5.4  5.4 6.1  NEUTROABS 5.8  --  CANCELED  --   --   --  4.0  HGB 13.1   < > CANCELED 12.9* 14.3 12.5*  12.5* 13.4  HCT 39.5   < > CANCELED 38.3* 42.1 37*  37* 40.5  MCV 95.6   < > CANCELED 94.3 94  --  98.6  PLT 190   < > CANCELED 209 243 202 228.0   < > = values in this interval not displayed.   Cardiac Enzymes: Recent Labs    01/10/16 1904 01/10/16 2342 01/11/16 0537  TROPONINI <0.03 <0.03 <0.03   BNP: Invalid input(s): POCBNP Lab Results  Component Value Date   HGBA1C 5.6 12/07/2015   Lab Results  Component Value Date   TSH 1.53 09/17/2016   No results found for: VITAMINB12 No results found for: FOLATE Lab Results  Component Value Date   FERRITIN 88 10/11/2016    Lipid Panel: No results for input(s): CHOL, HDL, LDLCALC, TRIG, CHOLHDL, LDLDIRECT in the last 8760 hours. Lab Results  Component Value Date   HGBA1C 5.6 12/07/2015    Procedures since last visit: Dg Chest 2 View  Result Date: 11/27/2016 CLINICAL DATA:  83 year old male with history of dry cough and shortness of breath for the past 2 weeks. EXAM: CHEST  2 VIEW COMPARISON:  Chest x-ray 14 2018. FINDINGS: Lung volumes are normal. No consolidative airspace disease. No  pleural effusions. Interstitial prominence throughout the lung bases bilaterally, similar to prior studies. No evidence of pulmonary edema. Heart size is borderline enlarged. Large hiatal hernia. Upper mediastinal contours are within normal limits. Aortic atherosclerosis. Status post median sternotomy for CABG. Left-sided pacemaker. With lead tips projecting over the expected location of the right atrium and right ventricular apex. IMPRESSION: 1. No radiographic evidence of acute cardiopulmonary disease. 2. Coarse interstitial markings throughout the mid to lower lungs bilaterally, similar to prior studies. If there is clinical concern for underlying interstitial lung disease this could be further evaluated with followup nonemergent high-resolution chest CT. 3. Cardiomegaly. 4. Large hiatal hernia. 5. Aortic atherosclerosis. Electronically Signed   By: Vinnie Langton M.D.   On: 11/27/2016 16:00    Assessment/Plan  1. Weight gain Has gained 8 lbs since last visit at the cardiology clinic. On home weight review, has gained 8 lbs since 10/18. Will adjust torsemide dosing as below.  - CMP with eGFR; Future  2. Acute on chronic combined systolic and diastolic congestive heart failure (HCC) Has history of severe AS. Increase torsemide to 10 mg daily x 2 weeks and then daily as needed to help diurese. Daily weight log and BP log at home for now. Reassess in 2 weeks. Concern for pt and wife not able to understand medication recommendation due to possible polypharmacy.  - CMP with eGFR; Future  3. Orthostatic hypotension Continue florinef and midodrine for now and monitor. Reviewed cardiology OV note from 11/20/16.    Labs/tests ordered:  Bmp with eGFR  Next appointment: 2 week  Communication: reviewed care plan with patient, his wife and daughter over the telephone. Daughter to help with pills being  placed in pill box. Wife helps with administration of his pills.     Blanchie Serve, MD Internal  [facility] [address] [place], [place] [address] Cell Phone (Monday-Friday 8 am - 5 pm): [phone] On Call: [phone] and follow prompts after 5 pm and on weekends Office Phone: [phone] Office Fax: [phone]

## 2016-12-09 NOTE — Patient Instructions (Signed)
  Take torsemide daily for now for 2 weeks. I will see you back in 2 weeks.  I want you to get blood work in 1 week.   Check blood pressure twice a day for now and bring 2 weeks reading on your next visit.   Check daily weight and bring reading on your next visit.

## 2016-12-10 ENCOUNTER — Telehealth: Payer: Self-pay

## 2016-12-10 NOTE — Telephone Encounter (Signed)
Patient's daughter, Adah Salvage, called to ask that Dr. Bubba Camp call her to discuss questions she has about patient and assisted living. She did not give any other details and she did stated that it was not urgent.   Mrs. Edman Circle phone number is [phone]

## 2016-12-10 NOTE — Telephone Encounter (Signed)
[place], can you call Ms Caryl Asp, daughter of Mr Bero? I saw him yesterday in the clinic and had a telephone conversation with his daughter Caryl Asp. Could you find out what her concerns are? Thanks.

## 2016-12-10 NOTE — Telephone Encounter (Signed)
Left a message on the answer machine for the patient's daughter call me back. Will follow up tomorrow.

## 2016-12-12 NOTE — Telephone Encounter (Signed)
Patient's daughter calling again to speak with either CMA.

## 2016-12-13 NOTE — Telephone Encounter (Signed)
Spoke with Mrs. Edman Circle. She wanted to know Dr. Jackolyn Confer reasons for suggesting AL for patient and wife so that she could let them know. Daughter is concerned that by moving them, it would cause them both to just give up and lose their quality of life. Reviewed office notes and advised that with numerous meds, waking during the night, using a walker and a cath-there was possible concern for falls with injury to one or both of them especially overnight. Being in AL would ensure 24 hour assistance with any need they had, but not prevent them from participating in any activities that they wanted to be involved in. She was also concerned about them not having a room that would accommodate both of them because they wouldn't want to be separated. I asked her to check with [facility] about their rooms because I wasn't aware of all they had to offer. She was very understanding and seemed to agree with what I told her, so she will discuss everything with her parents.

## 2016-12-18 NOTE — Telephone Encounter (Signed)
Noted. Thank you Kim for explaining and counselling the daughter. Please let me know if daughter has further questions.

## 2016-12-23 ENCOUNTER — Encounter: Payer: Self-pay | Admitting: Internal Medicine

## 2016-12-28 ENCOUNTER — Other Ambulatory Visit: Payer: Self-pay | Admitting: Internal Medicine

## 2017-01-02 ENCOUNTER — Other Ambulatory Visit: Payer: Self-pay

## 2017-01-06 ENCOUNTER — Ambulatory Visit (INDEPENDENT_AMBULATORY_CARE_PROVIDER_SITE_OTHER): Payer: Medicare Other | Admitting: *Deleted

## 2017-01-06 ENCOUNTER — Encounter: Payer: Self-pay | Admitting: Internal Medicine

## 2017-01-06 DIAGNOSIS — I495 Sick sinus syndrome: Secondary | ICD-10-CM | POA: Diagnosis not present

## 2017-01-06 DIAGNOSIS — R001 Bradycardia, unspecified: Secondary | ICD-10-CM | POA: Diagnosis not present

## 2017-01-06 DIAGNOSIS — Z950 Presence of cardiac pacemaker: Secondary | ICD-10-CM | POA: Diagnosis not present

## 2017-01-06 LAB — CUP PACEART INCLINIC DEVICE CHECK
Battery Remaining Longevity: 0 mo
Battery Voltage: 2.59 V
Brady Statistic RA Percent Paced: 99 %
Brady Statistic RV Percent Paced: 68 %
Date Time Interrogation Session: 20181203170053
Implantable Lead Implant Date: 20100309
Implantable Lead Implant Date: 20100309
Implantable Lead Location: 753859
Implantable Lead Location: 753860
Implantable Pulse Generator Implant Date: 20100309
Lead Channel Impedance Value: 412.5 Ohm
Lead Channel Impedance Value: 487.5 Ohm
Lead Channel Pacing Threshold Amplitude: 0.75 V
Lead Channel Pacing Threshold Amplitude: 0.75 V
Lead Channel Pacing Threshold Pulse Width: 0.4 ms
Lead Channel Pacing Threshold Pulse Width: 0.4 ms
Lead Channel Sensing Intrinsic Amplitude: 11.1 mV
Lead Channel Setting Pacing Amplitude: 1 V
Lead Channel Setting Pacing Amplitude: 2.5 V
Lead Channel Setting Pacing Pulse Width: 0.4 ms
Lead Channel Setting Sensing Sensitivity: 2 mV
Pulse Gen Model: 2210
Pulse Gen Serial Number: 2173363

## 2017-01-06 NOTE — Progress Notes (Signed)
Pacemaker check in clinic. Normal device function, ERI as of 01/03/17. Thresholds, sensing, impedances consistent with previous measurements. Device programmed to maximize longevity. No mode switch or high ventricular rates noted. Device programmed at appropriate safety margins. Histogram distribution appropriate for patient activity level. Device programmed to optimize intrinsic conduction. Patient education completed. ROV with [place] on 02/27/17, unless able to schedule for gen change sooner.

## 2017-01-06 NOTE — Patient Instructions (Signed)
Adam [name] will make Dr. Sallyanne Kuster aware of your pacemaker battery status (reached replacement time as of 01/03/17).  Chelley, Dr. Victorino December nurse, will call you with further instructions regarding your pacemaker generator changeout procedure.  If you need to reach [place] in the interim, please call [phone].

## 2017-01-10 LAB — COMPLETE METABOLIC PANEL WITH GFR
AG Ratio: 1.3 (calc) (ref 1.0–2.5)
ALT: 25 U/L (ref 9–46)
AST: 29 U/L (ref 10–35)
Albumin: 3.5 g/dL — ABNORMAL LOW (ref 3.6–5.1)
Alkaline phosphatase (APISO): 62 U/L (ref 40–115)
BUN: 14 mg/dL (ref 7–25)
CO2: 32 mmol/L (ref 20–32)
Calcium: 8.9 mg/dL (ref 8.6–10.3)
Chloride: 103 mmol/L (ref 98–110)
Creat: 0.76 mg/dL (ref 0.70–1.11)
GFR, Est African American: 95 mL/min/{1.73_m2} (ref >=60)
GFR, Est Non African American: 82 mL/min/{1.73_m2} (ref >=60)
Globulin: 2.8 g/dL (calc) (ref 1.9–3.7)
Glucose, Bld: 82 mg/dL (ref 65–99)
Potassium: 3.9 mmol/L (ref 3.5–5.3)
Sodium: 143 mmol/L (ref 135–146)
Total Bilirubin: 0.4 mg/dL (ref 0.2–1.2)
Total Protein: 6.3 g/dL (ref 6.1–8.1)

## 2017-01-10 LAB — CBC
HCT: 38.3 % — ABNORMAL LOW (ref 38.5–50.0)
Hemoglobin: 13.2 g/dL (ref 13.2–17.1)
MCH: 32.4 pg (ref 27.0–33.0)
MCHC: 34.5 g/dL (ref 32.0–36.0)
MCV: 93.9 fL (ref 80.0–100.0)
MPV: 9.1 fL (ref 7.5–12.5)
Platelets: 236 10*3/uL (ref 140–400)
RBC: 4.08 10*6/uL — ABNORMAL LOW (ref 4.20–5.80)
RDW: 13.2 % (ref 11.0–15.0)
WBC: 6 10*3/uL (ref 3.8–10.8)

## 2017-01-10 LAB — URINALYSIS, ROUTINE W REFLEX MICROSCOPIC

## 2017-01-11 ENCOUNTER — Other Ambulatory Visit: Payer: Self-pay | Admitting: Internal Medicine

## 2017-01-13 ENCOUNTER — Encounter: Payer: Self-pay | Admitting: Internal Medicine

## 2017-01-16 ENCOUNTER — Encounter: Payer: Self-pay | Admitting: Cardiovascular Disease

## 2017-01-16 ENCOUNTER — Ambulatory Visit (INDEPENDENT_AMBULATORY_CARE_PROVIDER_SITE_OTHER): Payer: Medicare Other | Admitting: Cardiovascular Disease

## 2017-01-16 ENCOUNTER — Telehealth: Payer: Self-pay | Admitting: Cardiovascular Disease

## 2017-01-16 VITALS — BP 115/70 | HR 63 | Ht 70.0 in | Wt 173.0 lb

## 2017-01-16 DIAGNOSIS — Z45010 Encounter for checking and testing of cardiac pacemaker pulse generator [battery]: Secondary | ICD-10-CM | POA: Diagnosis not present

## 2017-01-16 DIAGNOSIS — I472 Ventricular tachycardia, unspecified: Secondary | ICD-10-CM

## 2017-01-16 DIAGNOSIS — Z5181 Encounter for therapeutic drug level monitoring: Secondary | ICD-10-CM

## 2017-01-16 DIAGNOSIS — Z8673 Personal history of transient ischemic attack (TIA), and cerebral infarction without residual deficits: Secondary | ICD-10-CM | POA: Diagnosis not present

## 2017-01-16 DIAGNOSIS — I495 Sick sinus syndrome: Secondary | ICD-10-CM

## 2017-01-16 DIAGNOSIS — I951 Orthostatic hypotension: Secondary | ICD-10-CM | POA: Diagnosis not present

## 2017-01-16 DIAGNOSIS — I5042 Chronic combined systolic (congestive) and diastolic (congestive) heart failure: Secondary | ICD-10-CM

## 2017-01-16 DIAGNOSIS — I2510 Atherosclerotic heart disease of native coronary artery without angina pectoris: Secondary | ICD-10-CM

## 2017-01-16 DIAGNOSIS — Z79899 Other long term (current) drug therapy: Secondary | ICD-10-CM

## 2017-01-16 MED ORDER — AMIODARONE HCL 100 MG PO TABS: 100.0000 mg | ORAL_TABLET | Freq: Every day | ORAL | 3 refills | Status: DC

## 2017-01-16 NOTE — Telephone Encounter (Signed)
Wrong provider attached.  Dr. Sallyanne Kuster Patient

## 2017-01-16 NOTE — H&P (View-Only) (Signed)
Patient ID: Adam [name], male   DOB: Nov 21, 1926, 83 y.o.   MRN: [id number]    Cardiology Office Note    Date:  01/17/2017   ID:  RAMAJ [name], DOB 10/21/1926, MRN [id number]  PCP:  Adam Serve, MD  Cardiologist:   Adam [name], MD   Chief complaint: pacemaker ERI  History of Present Illness:  Adam [name] is a 83 y.o. male with a long-standing history of coronary artery disease and previous bypass surgery with an extensive scar from inferior wall myocardial infarction, ischemic cardiomyopathy with combined systolic and diastolic heart failure, history of sustained symptomatic ventricular tachycardia (probably "scar VT"), previous ischemic stroke, sinus node dysfunction with dual chamber permanent pacemaker, severe orthostatic hypotension that is felt to be neurogenic in etiology.   His pacemaker (ST. Jude Accent) reached ERI on Jan 03, 2017. He is "atrially dependent".  Lead parameters remain normal. He also has 70% ventricular pacing. His pacemaker has not shown recent VT, and it has never recorded atrial fibrillation.   He continues to have problems with feeling weak every morning, but symptoms improve around 10 or 11:00.  He does not take his Midodrine when he first wakes up, since he first takes levothyroxine and then waits 2 hours before taking any other medications.  He has CAD and is currently free of angina pectoris. He underwent bypass surgery 1996. He had placement of a stent to the SVG to RCA in 2011. His last cardiac catheterization in 2012 show that vessel to be widely patent but all his native coronary arteries are occluded and he is graft dependent. He is nuclear stress test in December 2015 showed an extensive inferior wall scar without any areas of ischemia. in June 2016 he presented with rapid palpitations associated with near-syncope, dyspnea and extreme fatigue , but did not lose consciousness. Interrogation of his pacemaker showed relatively slow ventricular  tachycardia at around 150 bpm that appear to have a regular, monomorphic electrogram. His dose of amiodarone was increased and he has not had ventricular tachycardia since. He was evaluated by Dr. Caryl Comes. After some debate, we decided that a conservative approach is most appropriate in view of his declining overall functional status and advanced age. Unfortunately has also been developing progressive dementia, primarily manifested as short-term memory loss. He is now a resident at [facility]. In November 2017 he presented with a right lower extremity weakness and was diagnosed as having an ischemic stroke. He was prescribed an oral anticoagulant due to suspicion that his stroke was related to atrial fibrillation, but atrial fibrillation has never occurred/has never been recorded by his pacemaker so this medication was stopped.    Past Medical History:  Diagnosis Date  . Arthritis    "minor, back and sometimes knees" (12/15/2012)  . Bradycardia    AFib/SSS s/p St Jude PPM 04/12/2008  . CAD (coronary artery disease) 12/30/2014   CABG (LIMA-LAD, SVG-RCA, SVG-OM in 1996).  07/2009 BMS to SVG-RCA. Cath in 04/2010 with patent stents   . Cardiomyopathy, ischemic 08/25/2012  . CHF (congestive heart failure) ([place])   . Chronic knee pain 12/03/2014  . Combined congestive systolic and diastolic heart failure ([place]) 02/02/2015   Hx EF 41%. BNP 96.8 02/21/15 Torsemide 04/06/15 Na 142, K 4.6, Bun 16, creat 0.89 04/20/14 BNP 111.7, Na 142, K 4.6, Bun 16, creat 0.9   . Depression with anxiety 02/02/2015   02/21/15 Hgb A1c 5.8 03/10/15 MMSE 30/30   . Dizziness, after diuretic asscoiated with hypotension and  responded to fluid bolus 06/05/2011   04/28/15 US carotid R+L normal bilateral arterial velocities.    Marland Kitchen Dyspnea 08/11/2014   Followed in Pulmonary clinic/  Healthcare/ [facility]  - 08/11/2014  Walked RA x 1 laps @ 185 ft each stopped due to fatigue/off balance/ slow pace/  no sob or desat  - PFT's  09/26/2014  FEV1 2.26 (85  % ) ratio 76  p no % improvement from saba with DLCO  67 % corrects to 93 % for alv volume      Since prev study 08/04/13 minimal change lung vol or dlco    . Embolic cerebral infarction ([place]) 12/06/2015  . Exertional shortness of breath    "sometimes walking" (12/15/2012)  . GERD (gastroesophageal reflux disease)   . Gout 02/09/2015  . Heart murmur    "just told I had one today" (12/15/2012)  . Hiatal hernia   . Hyperlipidemia   . Hypertension   . Hypothyroid   . Influenza A 03/10/2016  . Insomnia   . Melanoma of back ([place]) 1976  . Myocardial infarction [facility]) 1996; 2011   "both silent" (12/15/2012)  . Nonrheumatic aortic valve stenosis   . Orthostatic hypotension   . Osteoporosis, senile   . Pacemaker   . RBBB   . Restless leg 02/02/2015  . Right leg weakness 12/06/2015  . [facility] spotted fever   . S/P CABG x 4   . Sick sinus syndrome ([place]) 01/31/2014  . Sustained ventricular tachycardia ([place]) 07/27/2014  . Urinary retention 12/30/2014    Past Surgical History:  Procedure Laterality Date  . CARDIAC CATHETERIZATION  04/2010   LIMA to LAD patent,SVG to OM patent,no in-stnet restenosis RCA  . CATARACT EXTRACTION W/ INTRAOCULAR LENS  IMPLANT, BILATERAL Bilateral 2012  . CORONARY ANGIOPLASTY WITH STENT PLACEMENT  07/2009   bare metal stent to SVG to the RCA  . CORONARY ARTERY BYPASS GRAFT  1996   LIMA to LAD,SVG to RCA & SVG to OM  . INSERT / REPLACE / REMOVE PACEMAKER  2010  . MELANOMA EXCISION  05/1974 X2   "taken off my back" (12/15/2012)  . NM MYOVIEW LTD  06/2011   low risk  . TONSILLECTOMY  1938  . TRANSURETHRAL RESECTION OF PROSTATE  1986  . US ECHOCARDIOGRAPHY  07/11/2009   EF 45-50%    Current Medications: Outpatient Medications Prior to Visit  Medication Sig Dispense Refill  . acetaminophen (TYLENOL) 325 MG tablet Take 325 mg as needed by mouth.     Marland Kitchen aspirin EC 81 MG tablet Take 1 tablet (81 mg total) by mouth daily. 30 tablet 1  . cetirizine (ZYRTEC) 10 MG  tablet Take 10 mg by mouth daily. 1 hour before bedtime    . Cholecalciferol (VITAMIN D3) 5000 UNITS CAPS Take 5,000 Units by mouth every Monday.     . clopidogrel (PLAVIX) 75 MG tablet TAKE 1 TABLET ONCE DAILY. 30 tablet 3  . guaiFENesin (MUCINEX) 600 MG 12 hr tablet Take 600 mg by mouth 2 (two) times daily.     Marland Kitchen levothyroxine (SYNTHROID, LEVOTHROID) 137 MCG tablet Take 1 tablet (137 mcg total) by mouth daily before breakfast. 90 tablet 3  . midodrine (PROAMATINE) 10 MG tablet TAKE 1 TABLET THREE TIMES DAILY. TAKE LAST DOSE AT LEAST 6 HOURS BEFORE BEDTIME. 180 tablet 0  . midodrine (PROAMATINE) 5 MG tablet Take 2 tablets (10 mg total) by mouth 3 (three) times daily with meals. 180 tablet 3  . pantoprazole (PROTONIX) 20  MG tablet Take 20 mg by mouth 2 (two) times daily before a meal.    . polyethylene glycol (MIRALAX / GLYCOLAX) packet Take 17 g by mouth as needed (if no bowel movement in 3 days).    . pravastatin (PRAVACHOL) 40 MG tablet Take 1 tablet (40 mg total) by mouth at bedtime. 90 tablet 3  . psyllium (METAMUCIL SMOOTH TEXTURE) 28 % packet Take 1 packet as needed by mouth.     Marland Kitchen rOPINIRole (REQUIP) 0.5 MG tablet Take 0.5 mg as needed by mouth.     . traZODone (DESYREL) 50 MG tablet Take 50 mg by mouth at bedtime. May give an additional 25mg  1 hour later if unable to sleep as needed.    Marland Kitchen amiodarone (PACERONE) 200 MG tablet Take 1 tablet (200 mg total) by mouth daily. 90 tablet 3  . fludrocortisone (FLORINEF) 0.1 MG tablet Take 1 tablet (0.1 mg total) by mouth 4 (four) times a week. Tuesdays, Thursdays, Saturdays, and Sundays 60 tablet 3  . torsemide (DEMADEX) 10 MG tablet Take 10 mg by mouth daily. Take 1 tablet daily for now for 2 weeks and then daily as needed for weight gain of 3lbs in 24 hours or 5lbs in 1 week     No facility-administered medications prior to visit.      Allergies:   Altace [ramipril]; Crestor [rosuvastatin calcium]; Penicillins; and Sulfa antibiotics   Social  History   Socioeconomic History  . Marital status: Married    Spouse name: None  . Number of children: 2  . Years of education: Masters  . Highest education level: None  Social Needs  . Financial resource strain: None  . Food insecurity - worry: None  . Food insecurity - inability: None  . Transportation needs - medical: None  . Transportation needs - non-medical: None  Occupational History  . Occupation: Retired Company secretary -Pensions consultant  Tobacco Use  . Smoking status: Never Smoker  . Smokeless tobacco: Never Used  Substance and Sexual Activity  . Alcohol use: No    Alcohol/week: 0.0 oz  . Drug use: No  . Sexual activity: No  Other Topics Concern  . None  Social History Narrative   Lives at [place] to [place] 01/09/15   Married - Violet   Never smoked   Alcohol none   Previously employed as Scientist, forensic for [employer].         Diet:Low sodium   Do you drink/eat things with caffeine? No   Marital status: Married                              What year were you married?1950   Do you live in a house, apartment, assisted living, condo, trailer, etc)?    Is it one or more stories? 1   How many persons live in your home? 2   Do you have any pets in your home? No   Current or past profession: Minister, [employer]   Do you exercise?     Very Little                                                 Type & how often:    Do you have a living will?  Yes   Do you  have a DNR Form? Yes   Do you have a POA/HPOA forms? Yes     Family History:  The patient's family history includes Anuerysm in his son; Coronary artery disease in his father and mother; Diabetes in his father and mother; Heart disease in his brother, father, and mother; Lung cancer in his father.   ROS:   Please see the history of present illness.    ROS All other systems reviewed and are negative.   PHYSICAL EXAM:   VS:  BP 115/70   Pulse 63   Ht 5\' 10"  (1.778 m)   Wt 173 lb  (78.5 kg)   BMI 24.82 kg/m      General: Alert, oriented x3, no distress, appears frail Head: no evidence of trauma, PERRL, EOMI, no exophtalmos or lid lag, no myxedema, no xanthelasma; normal ears, nose and oropharynx Neck: normal jugular venous pulsations and no hepatojugular reflux; brisk carotid pulses without delay and no carotid bruits Chest: clear to auscultation, no signs of consolidation by percussion or palpation, normal fremitus, symmetrical and full respiratory excursions, healthy left subclavian pacemaker site Cardiovascular: normal position and quality of the apical impulse, regular rhythm, normal first and second heart sounds, no murmurs, rubs or gallops Abdomen: no tenderness or distention, no masses by palpation, no abnormal pulsatility or arterial bruits, normal bowel sounds, no hepatosplenomegaly Extremities: no clubbing, cyanosis or edema; 2+ radial, ulnar and brachial pulses bilaterally; 2+ right femoral, posterior tibial and dorsalis pedis pulses; 2+ left femoral, posterior tibial and dorsalis pedis pulses; no subclavian or femoral bruits Neurological: grossly nonfocal Psych: Normal mood and affect    Wt Readings from Last 3 Encounters:  01/16/17 173 lb (78.5 kg)  12/09/16 183 lb (83 kg)  11/27/16 179 lb 12.8 oz (81.6 kg)      Studies/Labs Reviewed:   EKG:  EKG is not ordered today.  Intracardiac electrogram shows atrial paced, ventricular sensed rhythm  Recent Labs: 09/17/2016: Pro B Natriuretic peptide (BNP) 721.0; TSH 1.53 01/01/2017: ALT 25 01/16/2017: BUN 13; Creatinine, Ser 0.91; Hemoglobin 13.6; Platelets 261; Potassium 3.8; Sodium 142   Lipid Panel    Component Value Date/Time   CHOL 84 12/07/2015 0449   TRIG 102 12/07/2015 0449   HDL 41 12/07/2015 0449   CHOLHDL 2.0 12/07/2015 0449   VLDL 20 12/07/2015 0449   LDLCALC 23 12/07/2015 0449     ASSESSMENT:    1. Pacemaker battery depletion   2. Chronic combined systolic and diastolic congestive  heart failure ([place])   3. Orthostatic hypotension   4. VT (ventricular tachycardia) ([place])   5. Encounter for monitoring amiodarone therapy   6. SSS (sick sinus syndrome) ([place])   7. Coronary artery disease involving native coronary artery of native heart without angina pectoris   8. History of arterial ischemic stroke      PLAN:  In order of problems listed above:  1. PM at ERI: He is "atrially dependent" and we will schedule him for generator change out in the next couple of weeks.This procedure has been fully reviewed with the patient and written informed consent has been obtained.  I believe he is at increased risk of infection due to the repeat procedure and infection would have serious consequences due to his dependency on the device.  Suggest using a Tyrx pouch. 2. CHF: No evidence of hypervolemia by exam today.  I remain a little puzzled by the fact that we are treating him with both torsemide and fludrocortisone.  I have recommended  discontinuing both of these medications.  3. Orthostatic hypotension: Reasonably well compensated at this time.  Advised him to take his morning dose of Midodrine as soon as he wakes up, at the same time as his levothyroxine.   I think we will have to tolerate some volume overload to prevent severe orthostatic hypotension.  If he begins having a lot of dizziness, can resume a much lower dose of fludrocortisone, but I prefer simply increasing the dose of Midodrine. Continue the use of compression stockings and abdominal binder.  Reminded him that he should not not lay fully horizontal for 4 hours after taking midodrine 4. VT: None seen in the last >15 months.  In particular, no VT seen since we reduce the dose of amiodarone to only 200 mg daily.  VT is probably related to inferior scar. 5. Amiodarone: Reduce the dose further to 100 mg once daily. Continue to check liver tests and thyroid tests every 6 months. 6. SSS: He has no intrinsic atrial activity. "Atrially  dependent". 7. CAD: Remains asymptomatic.  Has an extensive scar, but no reversible ischemia on his most recent nuclear study from November 2014. He has occluded native arteries and is graft dependent (LIMA to LAD, SVG to RCA, SVG to OM; status post stent in SVG to RCA 2011, patent by cath March 2012). November 2014 nuclear study shows inferior scar without ischemia, EF 41%.Conservative management is recommended. 8. History of CVA: His pacemaker has never shown evidence of atrial arrhythmia (flutter or fibrillation) that could explain his stroke. Anticoagulants are currently not indicated. Continue clopidogrel.  Do not plan to stop the clopidogrel for his pacemaker changeout.    Medication Adjustments/Labs and Tests Ordered: Current medicines are reviewed at length with the patient today.  Concerns regarding medicines are outlined above.  Medication changes, Labs and Tests ordered today are listed in the Patient Instructions below. Patient Instructions  Dr Sallyanne Kuster has recommended making the following medication changes: 1. STOP Fludrocortisone 2. STOP Torsemide 3. DECREASE Amiodarone to 100 mg daily 4. TAKE Midodrine differently - take your morning dose at 7:30 am with your levothyroxine  Your physician recommends that you weigh, daily, at the same time every day, and in the same amount of clothing. Please call the office if you weigh 180 pounds or more.  Dr Sallyanne Kuster recommends that you have your pacemaker battery/generator changed out. This has been scheduled for Wednesday, December 19th, 2018 with Dr Sallyanne Kuster. Please arrive to [place] 11:30a. Please follow procedure and surgical scrub instructions provided.  If you have any questions, please don't hesitate to reach out!  Lajean Saver, CMA Dr Sallyanne Kuster        Signed, Adam [name], MD  01/17/2017 6:37 PM    [address], [place], [place]  [address] Phone: [phone];  Fax: [phone]

## 2017-01-16 NOTE — Telephone Encounter (Signed)
Per Dr Croitoru's MA, pt device status is ERI-device is good for 3 months with this status.  Returned call to pt informed of above status of device. I do not see appt 02-12-17, scheduled appt for device this afternoon ok per CMA

## 2017-01-16 NOTE — Progress Notes (Signed)
Patient ID: Adam [name], male   DOB: Jun 21, 1926, 83 y.o.   MRN: [id number]    Cardiology Office Note    Date:  01/17/2017   ID:  Adam [name], DOB 1926/06/15, MRN [id number]  PCP:  Blanchie Serve, MD  Cardiologist:   [name], MD   Chief complaint: pacemaker ERI  History of Present Illness:  Adam [name] is a 83 y.o. male with a long-standing history of coronary artery disease and previous bypass surgery with an extensive scar from inferior wall myocardial infarction, ischemic cardiomyopathy with combined systolic and diastolic heart failure, history of sustained symptomatic ventricular tachycardia (probably "scar VT"), previous ischemic stroke, sinus node dysfunction with dual chamber permanent pacemaker, severe orthostatic hypotension that is felt to be neurogenic in etiology.   His pacemaker (ST. Jude Accent) reached ERI on Jan 03, 2017. He is "atrially dependent".  Lead parameters remain normal. He also has 70% ventricular pacing. His pacemaker has not shown recent VT, and it has never recorded atrial fibrillation.   He continues to have problems with feeling weak every morning, but symptoms improve around 10 or 11:00.  He does not take his Midodrine when he first wakes up, since he first takes levothyroxine and then waits 2 hours before taking any other medications.  He has CAD and is currently free of angina pectoris. He underwent bypass surgery 1996. He had placement of a stent to the SVG to RCA in 2011. His last cardiac catheterization in 2012 show that vessel to be widely patent but all his native coronary arteries are occluded and he is graft dependent. He is nuclear stress test in December 2015 showed an extensive inferior wall scar without any areas of ischemia. in June 2016 he presented with rapid palpitations associated with near-syncope, dyspnea and extreme fatigue , but did not lose consciousness. Interrogation of his pacemaker showed relatively slow ventricular  tachycardia at around 150 bpm that appear to have a regular, monomorphic electrogram. His dose of amiodarone was increased and he has not had ventricular tachycardia since. He was evaluated by Dr. Caryl Comes. After some debate, we decided that a conservative approach is most appropriate in view of his declining overall functional status and advanced age. Unfortunately has also been developing progressive dementia, primarily manifested as short-term memory loss. He is now a resident at [facility]. In November 2017 he presented with a right lower extremity weakness and was diagnosed as having an ischemic stroke. He was prescribed an oral anticoagulant due to suspicion that his stroke was related to atrial fibrillation, but atrial fibrillation has never occurred/has never been recorded by his pacemaker so this medication was stopped.    Past Medical History:  Diagnosis Date  . Arthritis    "minor, back and sometimes knees" (12/15/2012)  . Bradycardia    AFib/SSS s/p St Jude PPM 04/12/2008  . CAD (coronary artery disease) 12/30/2014   CABG (LIMA-LAD, SVG-RCA, SVG-OM in 1996).  07/2009 BMS to SVG-RCA. Cath in 04/2010 with patent stents   . Cardiomyopathy, ischemic 08/25/2012  . CHF (congestive heart failure) ([place])   . Chronic knee pain 12/03/2014  . Combined congestive systolic and diastolic heart failure ([place]) 02/02/2015   Hx EF 41%. BNP 96.8 02/21/15 Torsemide 04/06/15 Na 142, K 4.6, Bun 16, creat 0.89 04/20/14 BNP 111.7, Na 142, K 4.6, Bun 16, creat 0.9   . Depression with anxiety 02/02/2015   02/21/15 Hgb A1c 5.8 03/10/15 MMSE 30/30   . Dizziness, after diuretic asscoiated with hypotension and  responded to fluid bolus 06/05/2011   04/28/15 US carotid R+L normal bilateral arterial velocities.    Marland Kitchen Dyspnea 08/11/2014   Followed in Pulmonary clinic/ [facility]/ [place]  - 08/11/2014  Walked RA x 1 laps @ 185 ft each stopped due to fatigue/off balance/ slow pace/  no sob or desat  - PFT's  09/26/2014  FEV1 2.26 (85  % ) ratio 76  p no % improvement from saba with DLCO  67 % corrects to 93 % for alv volume      Since prev study 08/04/13 minimal change lung vol or dlco    . Embolic cerebral infarction ([place]) 12/06/2015  . Exertional shortness of breath    "sometimes walking" (12/15/2012)  . GERD (gastroesophageal reflux disease)   . Gout 02/09/2015  . Heart murmur    "just told I had one today" (12/15/2012)  . Hiatal hernia   . Hyperlipidemia   . Hypertension   . Hypothyroid   . Influenza A 03/10/2016  . Insomnia   . Melanoma of back ([place]) 1976  . Myocardial infarction [facility]) 1996; 2011   "both silent" (12/15/2012)  . Nonrheumatic aortic valve stenosis   . Orthostatic hypotension   . Osteoporosis, senile   . Pacemaker   . RBBB   . Restless leg 02/02/2015  . Right leg weakness 12/06/2015  . [facility] spotted fever   . S/P CABG x 4   . Sick sinus syndrome ([place]) 01/31/2014  . Sustained ventricular tachycardia ([place]) 07/27/2014  . Urinary retention 12/30/2014    Past Surgical History:  Procedure Laterality Date  . CARDIAC CATHETERIZATION  04/2010   LIMA to LAD patent,SVG to OM patent,no in-stnet restenosis RCA  . CATARACT EXTRACTION W/ INTRAOCULAR LENS  IMPLANT, BILATERAL Bilateral 2012  . CORONARY ANGIOPLASTY WITH STENT PLACEMENT  07/2009   bare metal stent to SVG to the RCA  . CORONARY ARTERY BYPASS GRAFT  1996   LIMA to LAD,SVG to RCA & SVG to OM  . INSERT / REPLACE / REMOVE PACEMAKER  2010  . MELANOMA EXCISION  05/1974 X2   "taken off my back" (12/15/2012)  . NM MYOVIEW LTD  06/2011   low risk  . TONSILLECTOMY  1938  . TRANSURETHRAL RESECTION OF PROSTATE  1986  . US ECHOCARDIOGRAPHY  07/11/2009   EF 45-50%    Current Medications: Outpatient Medications Prior to Visit  Medication Sig Dispense Refill  . acetaminophen (TYLENOL) 325 MG tablet Take 325 mg as needed by mouth.     Marland Kitchen aspirin EC 81 MG tablet Take 1 tablet (81 mg total) by mouth daily. 30 tablet 1  . cetirizine (ZYRTEC) 10 MG  tablet Take 10 mg by mouth daily. 1 hour before bedtime    . Cholecalciferol (VITAMIN D3) 5000 UNITS CAPS Take 5,000 Units by mouth every Monday.     . clopidogrel (PLAVIX) 75 MG tablet TAKE 1 TABLET ONCE DAILY. 30 tablet 3  . guaiFENesin (MUCINEX) 600 MG 12 hr tablet Take 600 mg by mouth 2 (two) times daily.     Marland Kitchen levothyroxine (SYNTHROID, LEVOTHROID) 137 MCG tablet Take 1 tablet (137 mcg total) by mouth daily before breakfast. 90 tablet 3  . midodrine (PROAMATINE) 10 MG tablet TAKE 1 TABLET THREE TIMES DAILY. TAKE LAST DOSE AT LEAST 6 HOURS BEFORE BEDTIME. 180 tablet 0  . midodrine (PROAMATINE) 5 MG tablet Take 2 tablets (10 mg total) by mouth 3 (three) times daily with meals. 180 tablet 3  . pantoprazole (PROTONIX) 20  MG tablet Take 20 mg by mouth 2 (two) times daily before a meal.    . polyethylene glycol (MIRALAX / GLYCOLAX) packet Take 17 g by mouth as needed (if no bowel movement in 3 days).    . pravastatin (PRAVACHOL) 40 MG tablet Take 1 tablet (40 mg total) by mouth at bedtime. 90 tablet 3  . psyllium (METAMUCIL SMOOTH TEXTURE) 28 % packet Take 1 packet as needed by mouth.     Marland Kitchen rOPINIRole (REQUIP) 0.5 MG tablet Take 0.5 mg as needed by mouth.     . traZODone (DESYREL) 50 MG tablet Take 50 mg by mouth at bedtime. May give an additional 25mg  1 hour later if unable to sleep as needed.    Marland Kitchen amiodarone (PACERONE) 200 MG tablet Take 1 tablet (200 mg total) by mouth daily. 90 tablet 3  . fludrocortisone (FLORINEF) 0.1 MG tablet Take 1 tablet (0.1 mg total) by mouth 4 (four) times a week. Tuesdays, Thursdays, Saturdays, and Sundays 60 tablet 3  . torsemide (DEMADEX) 10 MG tablet Take 10 mg by mouth daily. Take 1 tablet daily for now for 2 weeks and then daily as needed for weight gain of 3lbs in 24 hours or 5lbs in 1 week     No facility-administered medications prior to visit.      Allergies:   Altace [ramipril]; Crestor [rosuvastatin calcium]; Penicillins; and Sulfa antibiotics   Social  History   Socioeconomic History  . Marital status: Married    Spouse name: None  . Number of children: 2  . Years of education: Masters  . Highest education level: None  Social Needs  . Financial resource strain: None  . Food insecurity - worry: None  . Food insecurity - inability: None  . Transportation needs - medical: None  . Transportation needs - non-medical: None  Occupational History  . Occupation: Retired Company secretary -Pensions consultant  Tobacco Use  . Smoking status: Never Smoker  . Smokeless tobacco: Never Used  Substance and Sexual Activity  . Alcohol use: No    Alcohol/week: 0.0 oz  . Drug use: No  . Sexual activity: No  Other Topics Concern  . None  Social History Narrative   Lives at [place] to [place] 01/09/15   Married - Violet   Never smoked   Alcohol none   Previously employed as Scientist, forensic for [employer].         Diet:Low sodium   Do you drink/eat things with caffeine? No   Marital status: Married                              What year were you married?1950   Do you live in a house, apartment, assisted living, condo, trailer, etc)?    Is it one or more stories? 1   How many persons live in your home? 2   Do you have any pets in your home? No   Current or past profession: Minister, [employer]   Do you exercise?     Very Little                                                 Type & how often:    Do you have a living will?  Yes   Do you  have a DNR Form? Yes   Do you have a POA/HPOA forms? Yes     Family History:  The patient's family history includes Anuerysm in his son; Coronary artery disease in his father and mother; Diabetes in his father and mother; Heart disease in his brother, father, and mother; Lung cancer in his father.   ROS:   Please see the history of present illness.    ROS All other systems reviewed and are negative.   PHYSICAL EXAM:   VS:  BP 115/70   Pulse 63   Ht 5\' 10"  (1.778 m)   Wt 173 lb  (78.5 kg)   BMI 24.82 kg/m      General: Alert, oriented x3, no distress, appears frail Head: no evidence of trauma, PERRL, EOMI, no exophtalmos or lid lag, no myxedema, no xanthelasma; normal ears, nose and oropharynx Neck: normal jugular venous pulsations and no hepatojugular reflux; brisk carotid pulses without delay and no carotid bruits Chest: clear to auscultation, no signs of consolidation by percussion or palpation, normal fremitus, symmetrical and full respiratory excursions, healthy left subclavian pacemaker site Cardiovascular: normal position and quality of the apical impulse, regular rhythm, normal first and second heart sounds, no murmurs, rubs or gallops Abdomen: no tenderness or distention, no masses by palpation, no abnormal pulsatility or arterial bruits, normal bowel sounds, no hepatosplenomegaly Extremities: no clubbing, cyanosis or edema; 2+ radial, ulnar and brachial pulses bilaterally; 2+ right femoral, posterior tibial and dorsalis pedis pulses; 2+ left femoral, posterior tibial and dorsalis pedis pulses; no subclavian or femoral bruits Neurological: grossly nonfocal Psych: Normal mood and affect    Wt Readings from Last 3 Encounters:  01/16/17 173 lb (78.5 kg)  12/09/16 183 lb (83 kg)  11/27/16 179 lb 12.8 oz (81.6 kg)      Studies/Labs Reviewed:   EKG:  EKG is not ordered today.  Intracardiac electrogram shows atrial paced, ventricular sensed rhythm  Recent Labs: 09/17/2016: Pro B Natriuretic peptide (BNP) 721.0; TSH 1.53 01/01/2017: ALT 25 01/16/2017: BUN 13; Creatinine, Ser 0.91; Hemoglobin 13.6; Platelets 261; Potassium 3.8; Sodium 142   Lipid Panel    Component Value Date/Time   CHOL 84 12/07/2015 0449   TRIG 102 12/07/2015 0449   HDL 41 12/07/2015 0449   CHOLHDL 2.0 12/07/2015 0449   VLDL 20 12/07/2015 0449   LDLCALC 23 12/07/2015 0449     ASSESSMENT:    1. Pacemaker battery depletion   2. Chronic combined systolic and diastolic congestive  heart failure ([place])   3. Orthostatic hypotension   4. VT (ventricular tachycardia) ([place])   5. Encounter for monitoring amiodarone therapy   6. SSS (sick sinus syndrome) ([place])   7. Coronary artery disease involving native coronary artery of native heart without angina pectoris   8. History of arterial ischemic stroke      PLAN:  In order of problems listed above:  1. PM at ERI: He is "atrially dependent" and we will schedule him for generator change out in the next couple of weeks.This procedure has been fully reviewed with the patient and written informed consent has been obtained.  I believe he is at increased risk of infection due to the repeat procedure and infection would have serious consequences due to his dependency on the device.  Suggest using a Tyrx pouch. 2. CHF: No evidence of hypervolemia by exam today.  I remain a little puzzled by the fact that we are treating him with both torsemide and fludrocortisone.  I have recommended  discontinuing both of these medications.  3. Orthostatic hypotension: Reasonably well compensated at this time.  Advised him to take his morning dose of Midodrine as soon as he wakes up, at the same time as his levothyroxine.   I think we will have to tolerate some volume overload to prevent severe orthostatic hypotension.  If he begins having a lot of dizziness, can resume a much lower dose of fludrocortisone, but I prefer simply increasing the dose of Midodrine. Continue the use of compression stockings and abdominal binder.  Reminded him that he should not not lay fully horizontal for 4 hours after taking midodrine 4. VT: None seen in the last >15 months.  In particular, no VT seen since we reduce the dose of amiodarone to only 200 mg daily.  VT is probably related to inferior scar. 5. Amiodarone: Reduce the dose further to 100 mg once daily. Continue to check liver tests and thyroid tests every 6 months. 6. SSS: He has no intrinsic atrial activity. "Atrially  dependent". 7. CAD: Remains asymptomatic.  Has an extensive scar, but no reversible ischemia on his most recent nuclear study from November 2014. He has occluded native arteries and is graft dependent (LIMA to LAD, SVG to RCA, SVG to OM; status post stent in SVG to RCA 2011, patent by cath March 2012). November 2014 nuclear study shows inferior scar without ischemia, EF 41%.Conservative management is recommended. 8. History of CVA: His pacemaker has never shown evidence of atrial arrhythmia (flutter or fibrillation) that could explain his stroke. Anticoagulants are currently not indicated. Continue clopidogrel.  Do not plan to stop the clopidogrel for his pacemaker changeout.    Medication Adjustments/Labs and Tests Ordered: Current medicines are reviewed at length with the patient today.  Concerns regarding medicines are outlined above.  Medication changes, Labs and Tests ordered today are listed in the Patient Instructions below. Patient Instructions  Dr Sallyanne Kuster has recommended making the following medication changes: 1. STOP Fludrocortisone 2. STOP Torsemide 3. DECREASE Amiodarone to 100 mg daily 4. TAKE Midodrine differently - take your morning dose at 7:30 am with your levothyroxine  Your physician recommends that you weigh, daily, at the same time every day, and in the same amount of clothing. Please call the office if you weigh 180 pounds or more.  Dr Sallyanne Kuster recommends that you have your pacemaker battery/generator changed out. This has been scheduled for Wednesday, December 19th, 2018 with Dr Sallyanne Kuster. Please arrive to [place] 11:30a. Please follow procedure and surgical scrub instructions provided.  If you have any questions, please don't hesitate to reach out!  Lajean Saver, CMA Dr Sallyanne Kuster        Signed, [name], MD  01/17/2017 6:37 PM    [address], [place], [place]  [address] Phone: [phone];  Fax: [phone]

## 2017-01-16 NOTE — Patient Instructions (Signed)
Dr Sallyanne Kuster has recommended making the following medication changes: 1. STOP Fludrocortisone 2. STOP Torsemide 3. DECREASE Amiodarone to 100 mg daily 4. TAKE Midodrine differently - take your morning dose at 7:30 am with your levothyroxine  Your physician recommends that you weigh, daily, at the same time every day, and in the same amount of clothing. Please call the office if you weigh 180 pounds or more.  Dr Sallyanne Kuster recommends that you have your pacemaker battery/generator changed out. This has been scheduled for Wednesday, December 19th, 2018 with Dr Sallyanne Kuster. Please arrive to [place] 11:30a. Please follow procedure and surgical scrub instructions provided.  If you have any questions, please don't hesitate to reach out!  Lajean Saver, CMA Dr Sallyanne Kuster

## 2017-01-16 NOTE — Telephone Encounter (Signed)
New Message  Pt wife called requesting to speak with RN about getting pt seen sooner than next available on jan 8. She states pt device has been dead since 08-Jan-2023. Please call back to discuss

## 2017-01-17 LAB — BASIC METABOLIC PANEL
BUN/Creatinine Ratio: 14 (ref 10–24)
BUN: 13 mg/dL (ref 8–27)
CO2: 27 mmol/L (ref 20–29)
Calcium: 8.8 mg/dL (ref 8.6–10.2)
Chloride: 103 mmol/L (ref 96–106)
Creatinine, Ser: 0.91 mg/dL (ref 0.76–1.27)
GFR calc Af Amer: 87 mL/min/{1.73_m2} (ref >59)
GFR calc non Af Amer: 75 mL/min/{1.73_m2} (ref >59)
Glucose: 105 mg/dL — ABNORMAL HIGH (ref 65–99)
Potassium: 3.8 mmol/L (ref 3.5–5.2)
Sodium: 142 mmol/L (ref 134–144)

## 2017-01-17 LAB — CBC
Hematocrit: 40.3 % (ref 37.5–51.0)
Hemoglobin: 13.6 g/dL (ref 13.0–17.7)
MCH: 32.2 pg (ref 26.6–33.0)
MCHC: 33.7 g/dL (ref 31.5–35.7)
MCV: 95 fL (ref 79–97)
Platelets: 261 10*3/uL (ref 150–379)
RBC: 4.23 x10E6/uL (ref 4.14–5.80)
RDW: 14.7 % (ref 12.3–15.4)
WBC: 6.4 10*3/uL (ref 3.4–10.8)

## 2017-01-17 LAB — PROTIME-INR
INR: 1 (ref 0.8–1.2)
Prothrombin Time: 10.3 s (ref 9.1–12.0)

## 2017-01-20 ENCOUNTER — Encounter: Payer: Self-pay | Admitting: Internal Medicine

## 2017-01-20 ENCOUNTER — Non-Acute Institutional Stay: Payer: Medicare Other | Admitting: Internal Medicine

## 2017-01-20 VITALS — BP 122/68 | HR 63 | Temp 97.6°F | Resp 16 | Ht 70.0 in | Wt 175.8 lb

## 2017-01-20 DIAGNOSIS — E039 Hypothyroidism, unspecified: Secondary | ICD-10-CM

## 2017-01-20 DIAGNOSIS — K5909 Other constipation: Secondary | ICD-10-CM

## 2017-01-20 DIAGNOSIS — F5101 Primary insomnia: Secondary | ICD-10-CM | POA: Diagnosis not present

## 2017-01-20 DIAGNOSIS — K2100 Gastro-esophageal reflux disease with esophagitis, without bleeding: Secondary | ICD-10-CM

## 2017-01-20 DIAGNOSIS — I5042 Chronic combined systolic (congestive) and diastolic (congestive) heart failure: Secondary | ICD-10-CM

## 2017-01-20 DIAGNOSIS — G2581 Restless legs syndrome: Secondary | ICD-10-CM

## 2017-01-20 DIAGNOSIS — E782 Mixed hyperlipidemia: Secondary | ICD-10-CM

## 2017-01-20 NOTE — Progress Notes (Signed)
[facility]  Provider: Blanchie Serve MD   Location:  [place] of Service:  Clinic (12)  PCP: Blanchie Serve, MD Patient Care Team: Blanchie Serve, MD as PCP - General (Internal Medicine) Croitoru, Dani Gobble, MD as Attending Physician (Cardiology) Vevelyn Royals, MD as Consulting Physician (Ophthalmology) Franchot Gallo, MD as Consulting Physician (Urology) Tanda Rockers, MD as Consulting Physician (Pulmonary Disease) Allyn Kenner, MD (Dermatology) Deliah Goody, PA-C as Physician Assistant (Physician Assistant) Ngetich, Nelda Bucks, NP as Nurse Practitioner (Family Medicine)  Extended Emergency Contact Information Primary Emergency Contact: Larouche,[name] Address: [address]          [place] Phone: [phone] Mobile Phone: [phone] Relation: Spouse Secondary Emergency Contact: [name],Zahner  [place] of [place] Phone: [phone] Mobile Phone: [phone] Relation: Daughter   Goals of Care: Advanced Directive information Advanced Directives 10/02/2016  Does Patient Have a Medical Advance Directive? Yes  Type of Advance Directive Living will;Healthcare Power of Attorney  Does patient want to make changes to medical advance directive? No - Patient declined  Copy of [place] in Chart? Yes  Would patient like information on creating a medical advance directive? -  Pre-existing out of facility DNR order (yellow form or pink MOST form) -      Chief Complaint  Patient presents with  . Medical Management of Chronic Issues    3 month follow up. Patients wife is concerned about bumps on his scalp. Still have issues with bowel movements.   . Medication Refill    No refills needed at this time.   Marland Kitchen Results    Discuss labs  . FYI    Patients cough has improved per patient wife. Pacemaker battery is being replaced on Wednesday.     HPI: Patient is a 83 y.o. male  seen today for routine visit.   Insomnia- currently taking trazodone and requip for RLS is helping.   gerd- occasional, taking protonix bid that helps. Denies nausea or vomiting.   Constipation- currently on senokot s 2 tab daily as needed and miralax tid. Seen by GI before. Moves bowel every 2 days.denies nausea or vomiting. Denies blood in stool.  Chronic CHF- stable, has not required it recently. Breathing has been stable. On chart review, weight is stable.Not on b blcoker or ACEI with chronic hypotension  RLS- requip has been helping him.   Hypothyroidism- reviewed tsh, on levothyroxine  Hyperlipidemia- takes his statin, no recent lipid results  Of note- pending battery change for his pacemaker this Wednesday.   Past Medical History:  Diagnosis Date  . Arthritis    "minor, back and sometimes knees" (12/15/2012)  . Bradycardia    AFib/SSS s/p St Jude PPM 04/12/2008  . CAD (coronary artery disease) 12/30/2014   CABG (LIMA-LAD, SVG-RCA, SVG-OM in 1996).  07/2009 BMS to SVG-RCA. Cath in 04/2010 with patent stents   . Cardiomyopathy, ischemic 08/25/2012  . CHF (congestive heart failure) ([place])   . Chronic knee pain 12/03/2014  . Combined congestive systolic and diastolic heart failure ([place]) 02/02/2015   Hx EF 41%. BNP 96.8 02/21/15 Torsemide 04/06/15 Na 142, K 4.6, Bun 16, creat 0.89 04/20/14 BNP 111.7, Na 142, K 4.6, Bun 16, creat 0.9   . Depression with anxiety 02/02/2015   02/21/15 Hgb A1c 5.8 03/10/15 MMSE 30/30   . Dizziness, after diuretic asscoiated with hypotension and responded to fluid bolus 06/05/2011   04/28/15 US  carotid R+L normal bilateral arterial velocities.    Marland Kitchen Dyspnea 08/11/2014   Followed in Pulmonary clinic/ [facility]/ [place]  - 08/11/2014  Walked RA x 1 laps @ 185 ft each stopped due to fatigue/off balance/ slow pace/  no sob or desat  - PFT's  09/26/2014  FEV1 2.26 (85 % ) ratio 76  p no % improvement from saba with DLCO  67 % corrects to 93 % for alv volume      Since  prev study 08/04/13 minimal change lung vol or dlco    . Embolic cerebral infarction ([place]) 12/06/2015  . Exertional shortness of breath    "sometimes walking" (12/15/2012)  . GERD (gastroesophageal reflux disease)   . Gout 02/09/2015  . Heart murmur    "just told I had one today" (12/15/2012)  . Hiatal hernia   . Hyperlipidemia   . Hypertension   . Hypothyroid   . Influenza A 03/10/2016  . Insomnia   . Melanoma of back ([place]) 1976  . Myocardial infarction [facility]) 1996; 2011   "both silent" (12/15/2012)  . Nonrheumatic aortic valve stenosis   . Orthostatic hypotension   . Osteoporosis, senile   . Pacemaker   . RBBB   . Restless leg 02/02/2015  . Right leg weakness 12/06/2015  . [facility] spotted fever   . S/P CABG x 4   . Sick sinus syndrome ([place]) 01/31/2014  . Sustained ventricular tachycardia ([place]) 07/27/2014  . Urinary retention 12/30/2014   Past Surgical History:  Procedure Laterality Date  . CARDIAC CATHETERIZATION  04/2010   LIMA to LAD patent,SVG to OM patent,no in-stnet restenosis RCA  . CATARACT EXTRACTION W/ INTRAOCULAR LENS  IMPLANT, BILATERAL Bilateral 2012  . CORONARY ANGIOPLASTY WITH STENT PLACEMENT  07/2009   bare metal stent to SVG to the RCA  . CORONARY ARTERY BYPASS GRAFT  1996   LIMA to LAD,SVG to RCA & SVG to OM  . INSERT / REPLACE / REMOVE PACEMAKER  2010  . MELANOMA EXCISION  05/1974 X2   "taken off my back" (12/15/2012)  . NM MYOVIEW LTD  06/2011   low risk  . TONSILLECTOMY  1938  . TRANSURETHRAL RESECTION OF PROSTATE  1986  . US ECHOCARDIOGRAPHY  07/11/2009   EF 45-50%    reports that  has never smoked. he has never used smokeless tobacco. He reports that he does not drink alcohol or use drugs. Social History   Socioeconomic History  . Marital status: Married    Spouse name: Not on file  . Number of children: 2  . Years of education: Masters  . Highest education level: Not on file  Social Needs  . Financial resource strain: Not on file  . Food  insecurity - worry: Not on file  . Food insecurity - inability: Not on file  . Transportation needs - medical: Not on file  . Transportation needs - non-medical: Not on file  Occupational History  . Occupation: Retired Company secretary -Pensions consultant  Tobacco Use  . Smoking status: Never Smoker  . Smokeless tobacco: Never Used  Substance and Sexual Activity  . Alcohol use: No    Alcohol/week: 0.0 oz  . Drug use: No  . Sexual activity: No  Other Topics Concern  . Not on file  Social History Narrative   Lives at [place] 01/09/15   Married - Violet   Never smoked   Alcohol none   Previously employed as Scientist, forensic for [employer].  Diet:Low sodium   Do you drink/eat things with caffeine? No   Marital status: Married                              What year were you married?1950   Do you live in a house, apartment, assisted living, condo, trailer, etc)?    Is it one or more stories? 1   How many persons live in your home? 2   Do you have any pets in your home? No   Current or past profession: Minister, [employer]   Do you exercise?     Very Little                                                 Type & how often:    Do you have a living will?  Yes   Do you have a DNR Form? Yes   Do you have a POA/HPOA forms? Yes    Functional Status Survey:    Family History  Problem Relation Age of Onset  . Coronary artery disease Mother   . Diabetes Mother   . Heart disease Mother   . Coronary artery disease Father   . Diabetes Father   . Lung cancer Father   . Heart disease Father   . Anuerysm Son   . Heart disease Brother     Health Maintenance  Topic Date Due  . PNA vac Low Risk Adult (2 of 2 - PCV13) 02/04/2017 (Originally 10/06/2010)  . TETANUS/TDAP  08/01/2026 (Originally 08/01/2024)  . DEXA SCAN  12/14/2024  . INFLUENZA VACCINE  Completed    Allergies  Allergen Reactions  . Altace [Ramipril] Cough  . Crestor [Rosuvastatin  Calcium] Rash  . Penicillins Rash and Other (See Comments)    Has patient had a PCN reaction causing immediate rash, facial/tongue/throat swelling, SOB or lightheadedness with hypotension: No Has patient had a PCN reaction causing severe rash involving mucus membranes or skin necrosis: Yes Has patient had a PCN reaction that required hospitalization Yes Has patient had a PCN reaction occurring within the last 10 years: No If all of the above answers are "NO", then may proceed with Cephalosporin use.   . Sulfa Antibiotics Rash    Outpatient Encounter Medications as of 01/20/2017  Medication Sig  . acetaminophen (TYLENOL) 325 MG tablet Take 650 mg by mouth as needed for moderate pain or headache.   Marland Kitchen amiodarone (PACERONE) 100 MG tablet Take 1 tablet (100 mg total) by mouth daily.  Marland Kitchen aspirin EC 81 MG tablet Take 1 tablet (81 mg total) by mouth daily.  . cetirizine (ZYRTEC) 10 MG tablet Take 10 mg by mouth at bedtime.   . Cholecalciferol (VITAMIN D3) 5000 UNITS CAPS Take 5,000 Units by mouth every Monday.   . Clobetasol Propionate 0.05 % lotion Apply 1 application topically 2 (two) times daily.  . clopidogrel (PLAVIX) 75 MG tablet TAKE 1 TABLET ONCE DAILY.  Marland Kitchen guaiFENesin (MUCINEX) 600 MG 12 hr tablet Take 600 mg by mouth daily.   Marland Kitchen levothyroxine (SYNTHROID, LEVOTHROID) 137 MCG tablet Take 1 tablet (137 mcg total) by mouth daily before breakfast.  . midodrine (PROAMATINE) 10 MG tablet TAKE 1 TABLET THREE TIMES DAILY. TAKE LAST DOSE AT LEAST 6 HOURS BEFORE BEDTIME.  . pantoprazole (PROTONIX) 40 MG  tablet Take 40 mg by mouth 2 (two) times daily.  . polyethylene glycol (MIRALAX / GLYCOLAX) packet Take 17 g by mouth 3 (three) times daily.   . pravastatin (PRAVACHOL) 40 MG tablet Take 1 tablet (40 mg total) by mouth at bedtime.  Marland Kitchen rOPINIRole (REQUIP) 0.5 MG tablet Take 0.5 mg by mouth at bedtime.   . sennosides-docusate sodium (SENOKOT-S) 8.6-50 MG tablet Take 2 tablets by mouth as needed.   .  torsemide (DEMADEX) 10 MG tablet Take 10 mg by mouth daily as needed (swelling).  . traZODone (DESYREL) 50 MG tablet Take 50 mg by mouth at bedtime. May take an additional 25 mg as needed for sleep  . white petrolatum (VASELINE) GEL Apply 1 application topically as needed (rectal irritation).  Marland Kitchen ketoconazole (NIZORAL) 2 % cream Apply 1 application topically 2 (two) times daily.   No facility-administered encounter medications on file as of 01/20/2017.     Review of Systems  Constitutional: Negative for appetite change, chills and fever.  HENT: Positive for hearing loss. Negative for congestion, rhinorrhea and sore throat.   Eyes: Negative for pain and redness.  Respiratory: Positive for cough. Negative for shortness of breath.   Cardiovascular: Positive for leg swelling. Negative for chest pain and palpitations.  Gastrointestinal: Positive for constipation. Negative for abdominal pain, blood in stool, diarrhea, nausea and vomiting.  Genitourinary: Positive for hematuria.       Has chronic indwelling catheter  Musculoskeletal: Positive for gait problem. Negative for back pain.  Skin: Negative for rash and wound.       Being followed by dermatology for lesions on scalp by Dr Nevada Crane  Neurological: Negative for dizziness and headaches.  Hematological: Bruises/bleeds easily.  Psychiatric/Behavioral: Negative for behavioral problems and confusion.    Vitals:   01/20/17 1338  BP: 122/68  Pulse: 63  Resp: 16  Temp: 97.6 F (36.4 C)  TempSrc: Oral  SpO2: 97%  Weight: 175 lb 12.8 oz (79.7 kg)  Height: 5' 10" (1.778 m)   Body mass index is 25.22 kg/m.   Wt Readings from Last 3 Encounters:  01/20/17 175 lb 12.8 oz (79.7 kg)  01/16/17 173 lb (78.5 kg)  12/09/16 183 lb (83 kg)   Physical Exam  Constitutional: He is oriented to person, place, and time. He appears well-developed and well-nourished. No distress.  HENT:  Head: Normocephalic and atraumatic.  Mouth/Throat: Oropharynx is  clear and moist. No oropharyngeal exudate.  Eyes: Conjunctivae and EOM are normal. Pupils are equal, round, and reactive to light. Right eye exhibits no discharge. Left eye exhibits no discharge.  Neck: Neck supple.  Cardiovascular: Normal rate and regular rhythm.  Murmur heard. Pulmonary/Chest: Effort normal and breath sounds normal. He has no wheezes. He has no rales.  Abdominal: Soft. Bowel sounds are normal. There is no tenderness. There is no guarding.  Musculoskeletal: He exhibits edema and deformity.  Able to move all 4 extremities, unsteady gait, uses wheelchair to ambulate for long distance, uses walker for short distance  Lymphadenopathy:    He has no cervical adenopathy.  Neurological: He is alert and oriented to person, place, and time.  Skin: Skin is warm and dry. No rash noted. He is not diaphoretic.  Psychiatric: He has a normal mood and affect. His behavior is normal.    Labs reviewed: Basic Metabolic Panel: Recent Labs    09/17/16 1517 01/01/17 0000 01/16/17 1436  NA 139 143 142  K 4.1 3.9 3.8  CL 106 103 103  CO2 28 32 27  GLUCOSE 107* 82 105*  BUN _0 CREATININE 0.79 0.76 0.91  CALCIUM 8.6 8.9 8.8   Liver Function Tests: Recent Labs    03/10/16 1443 03/11/16 0436 04/03/16 05/08/16 08/15/16 01/01/17 0000  AST 53* 46* _1 ALT _2 ALKPHOS 61 51 60 78 62  --   BILITOT 0.6 0.4  --   --   --  0.4  PROT 6.9 5.8*  --   --   --  6.3  ALBUMIN 3.7 3.1*  --   --   --   --    No results for input(s): LIPASE, AMYLASE in the last 8760 hours. No results for input(s): AMMONIA in the last 8760 hours. CBC: Recent Labs    03/10/16 1443  07/31/16 1359  09/17/16 1517 01/01/17 0000 01/16/17 1436  WBC 8.3   < > CANCELED   < > 6.1 6.0 6.4  NEUTROABS 5.8  --  CANCELED  --  4.0  --   --   HGB 13.1   < > CANCELED   < > 13.4 13.2 13.6  HCT 39.5   < > CANCELED   < > 40.5 38.3* 40.3  MCV 95.6   < > CANCELED   < > 98.6 93.9 95  PLT 190   <  > CANCELED   < > 228.0 236 261   < > = values in this interval not displayed.   Cardiac Enzymes: No results for input(s): CKTOTAL, CKMB, CKMBINDEX, TROPONINI in the last 8760 hours. BNP: Invalid input(s): POCBNP Lab Results  Component Value Date   HGBA1C 5.6 12/07/2015   Lab Results  Component Value Date   TSH 1.53 09/17/2016   No results found for: VITAMINB12 No results found for: FOLATE Lab Results  Component Value Date   FERRITIN 88 10/11/2016    Lipid Panel: No results for input(s): CHOL, HDL, LDLCALC, TRIG, CHOLHDL, LDLDIRECT in the last 8760 hours. Lab Results  Component Value Date   HGBA1C 5.6 12/07/2015    Procedures since last visit: No results found.  Assessment/Plan  1. Chronic combined systolic and diastolic congestive heart failure (HCC) Continue torsemide as needed. Advised to take a dose today with his leg edema. BP appears stable. Breathing stable.  - CMP with eGFR; Future  2. Gastroesophageal reflux disease with esophagitis Stable, cough improved, continue PPI bid  3. RLS (restless legs syndrome) Continue requip current regimen  4. Chronic constipation Continue miralax tid with senna s as needed. Maintain hydration  5. Primary insomnia Continue trazodone  6. Hypothyroidism, unspecified type Continue levothyroxine - CMP with eGFR; Future - Lipid Panel; Future - TSH; Future  7. Mixed hyperlipidemia Continue pravastatin. - CMP with eGFR; Future - Lipid Panel; Future    Labs/tests ordered:   Orders Placed This Encounter  Procedures  . CMP with eGFR    Standing Status:   Future    Standing Expiration Date:   05/12/2017  . Lipid Panel    Standing Status:   Future    Standing Expiration Date:   05/12/2017  . TSH    Standing Status:   Future    Standing Expiration Date:   05/12/2017    Next appointment: 3 months  Communication: reviewed care plan with patient    Blanchie Serve, MD Internal Medicine [facility] [address] [place], [place] [address] Cell Phone (Monday-Friday 8 am -  5 pm): [phone] On Call: [phone] and follow prompts after 5 pm and on weekends Office Phone: [phone] Office Fax: [phone]

## 2017-01-21 ENCOUNTER — Other Ambulatory Visit: Payer: Self-pay | Admitting: Cardiovascular Disease

## 2017-01-21 ENCOUNTER — Telehealth: Payer: Self-pay | Admitting: Cardiovascular Disease

## 2017-01-21 ENCOUNTER — Other Ambulatory Visit: Payer: Self-pay | Admitting: Internal Medicine

## 2017-01-21 DIAGNOSIS — Z45010 Encounter for checking and testing of cardiac pacemaker pulse generator [battery]: Secondary | ICD-10-CM

## 2017-01-21 LAB — CUP PACEART INCLINIC DEVICE CHECK
Date Time Interrogation Session: 20181218163929
Implantable Lead Implant Date: 20100309
Implantable Lead Implant Date: 20100309
Implantable Lead Location: 753859
Implantable Lead Location: 753860
Implantable Pulse Generator Implant Date: 20100309
Lead Channel Setting Pacing Amplitude: 0.875
Lead Channel Setting Pacing Amplitude: 2.5 V
Lead Channel Setting Pacing Pulse Width: 0.4 ms
Lead Channel Setting Sensing Sensitivity: 2 mV
Pulse Gen Model: 2210
Pulse Gen Serial Number: 2173363

## 2017-01-21 NOTE — Telephone Encounter (Signed)
Note not needed 

## 2017-01-21 NOTE — Telephone Encounter (Signed)
New Message  Ms. [name] call requesting to speak with RN to see if pt would have to stop is medication Asprin and plavix today or tomorrow for procedure.

## 2017-01-21 NOTE — Telephone Encounter (Signed)
He should continue those unchanged MCr

## 2017-01-21 NOTE — Telephone Encounter (Signed)
Left detailed message for  Adam [name] (daughter-DPR) as stated by Dr Sallyanne Kuster.

## 2017-01-22 ENCOUNTER — Ambulatory Visit (HOSPITAL_COMMUNITY)
Admission: RE | Admit: 2017-01-22 | Discharge: 2017-01-22 | Disposition: A | Discharge status: Home / Self Care | Payer: Medicare Other | Source: Ambulatory Visit | Attending: Cardiovascular Disease | Admitting: Cardiovascular Disease

## 2017-01-22 ENCOUNTER — Encounter (HOSPITAL_COMMUNITY)
Admission: RE | Disposition: A | Discharge status: Home / Self Care | Payer: Self-pay | Source: Ambulatory Visit | Attending: Cardiovascular Disease

## 2017-01-22 DIAGNOSIS — Z955 Presence of coronary angioplasty implant and graft: Secondary | ICD-10-CM | POA: Diagnosis not present

## 2017-01-22 DIAGNOSIS — Z888 Allergy status to other drugs, medicaments and biological substances status: Secondary | ICD-10-CM | POA: Insufficient documentation

## 2017-01-22 DIAGNOSIS — Z8673 Personal history of transient ischemic attack (TIA), and cerebral infarction without residual deficits: Secondary | ICD-10-CM | POA: Diagnosis not present

## 2017-01-22 DIAGNOSIS — K219 Gastro-esophageal reflux disease without esophagitis: Secondary | ICD-10-CM | POA: Diagnosis not present

## 2017-01-22 DIAGNOSIS — R011 Cardiac murmur, unspecified: Secondary | ICD-10-CM | POA: Diagnosis not present

## 2017-01-22 DIAGNOSIS — I5042 Chronic combined systolic (congestive) and diastolic (congestive) heart failure: Secondary | ICD-10-CM | POA: Diagnosis not present

## 2017-01-22 DIAGNOSIS — Z951 Presence of aortocoronary bypass graft: Secondary | ICD-10-CM | POA: Insufficient documentation

## 2017-01-22 DIAGNOSIS — Z45010 Encounter for checking and testing of cardiac pacemaker pulse generator [battery]: Secondary | ICD-10-CM

## 2017-01-22 DIAGNOSIS — Z85820 Personal history of malignant melanoma of skin: Secondary | ICD-10-CM | POA: Diagnosis not present

## 2017-01-22 DIAGNOSIS — Z9841 Cataract extraction status, right eye: Secondary | ICD-10-CM | POA: Insufficient documentation

## 2017-01-22 DIAGNOSIS — I255 Ischemic cardiomyopathy: Secondary | ICD-10-CM | POA: Insufficient documentation

## 2017-01-22 DIAGNOSIS — Z45018 Encounter for adjustment and management of other part of cardiac pacemaker: Secondary | ICD-10-CM | POA: Insufficient documentation

## 2017-01-22 DIAGNOSIS — I110 Hypertensive heart disease with heart failure: Secondary | ICD-10-CM | POA: Insufficient documentation

## 2017-01-22 DIAGNOSIS — K449 Diaphragmatic hernia without obstruction or gangrene: Secondary | ICD-10-CM | POA: Diagnosis not present

## 2017-01-22 DIAGNOSIS — E039 Hypothyroidism, unspecified: Secondary | ICD-10-CM | POA: Insufficient documentation

## 2017-01-22 DIAGNOSIS — I951 Orthostatic hypotension: Secondary | ICD-10-CM | POA: Insufficient documentation

## 2017-01-22 DIAGNOSIS — M479 Spondylosis, unspecified: Secondary | ICD-10-CM | POA: Diagnosis not present

## 2017-01-22 DIAGNOSIS — M810 Age-related osteoporosis without current pathological fracture: Secondary | ICD-10-CM | POA: Insufficient documentation

## 2017-01-22 DIAGNOSIS — Z8249 Family history of ischemic heart disease and other diseases of the circulatory system: Secondary | ICD-10-CM | POA: Insufficient documentation

## 2017-01-22 DIAGNOSIS — M170 Bilateral primary osteoarthritis of knee: Secondary | ICD-10-CM | POA: Diagnosis not present

## 2017-01-22 DIAGNOSIS — E785 Hyperlipidemia, unspecified: Secondary | ICD-10-CM | POA: Diagnosis not present

## 2017-01-22 DIAGNOSIS — G8929 Other chronic pain: Secondary | ICD-10-CM | POA: Insufficient documentation

## 2017-01-22 DIAGNOSIS — M109 Gout, unspecified: Secondary | ICD-10-CM | POA: Diagnosis not present

## 2017-01-22 DIAGNOSIS — I252 Old myocardial infarction: Secondary | ICD-10-CM | POA: Diagnosis not present

## 2017-01-22 DIAGNOSIS — I4510 Unspecified right bundle-branch block: Secondary | ICD-10-CM | POA: Insufficient documentation

## 2017-01-22 DIAGNOSIS — G2581 Restless legs syndrome: Secondary | ICD-10-CM | POA: Insufficient documentation

## 2017-01-22 DIAGNOSIS — Z7982 Long term (current) use of aspirin: Secondary | ICD-10-CM | POA: Insufficient documentation

## 2017-01-22 DIAGNOSIS — Z882 Allergy status to sulfonamides status: Secondary | ICD-10-CM | POA: Insufficient documentation

## 2017-01-22 DIAGNOSIS — Z833 Family history of diabetes mellitus: Secondary | ICD-10-CM | POA: Insufficient documentation

## 2017-01-22 DIAGNOSIS — Z79899 Other long term (current) drug therapy: Secondary | ICD-10-CM | POA: Insufficient documentation

## 2017-01-22 DIAGNOSIS — R339 Retention of urine, unspecified: Secondary | ICD-10-CM | POA: Insufficient documentation

## 2017-01-22 DIAGNOSIS — I472 Ventricular tachycardia: Secondary | ICD-10-CM | POA: Insufficient documentation

## 2017-01-22 DIAGNOSIS — F418 Other specified anxiety disorders: Secondary | ICD-10-CM | POA: Diagnosis not present

## 2017-01-22 DIAGNOSIS — F0390 Unspecified dementia without behavioral disturbance: Secondary | ICD-10-CM | POA: Insufficient documentation

## 2017-01-22 DIAGNOSIS — I495 Sick sinus syndrome: Secondary | ICD-10-CM | POA: Diagnosis present

## 2017-01-22 DIAGNOSIS — Z801 Family history of malignant neoplasm of trachea, bronchus and lung: Secondary | ICD-10-CM | POA: Insufficient documentation

## 2017-01-22 DIAGNOSIS — Z880 Allergy status to penicillin: Secondary | ICD-10-CM | POA: Insufficient documentation

## 2017-01-22 DIAGNOSIS — G4700 Insomnia, unspecified: Secondary | ICD-10-CM | POA: Diagnosis not present

## 2017-01-22 DIAGNOSIS — I350 Nonrheumatic aortic (valve) stenosis: Secondary | ICD-10-CM | POA: Insufficient documentation

## 2017-01-22 DIAGNOSIS — I2510 Atherosclerotic heart disease of native coronary artery without angina pectoris: Secondary | ICD-10-CM | POA: Diagnosis not present

## 2017-01-22 DIAGNOSIS — Z9842 Cataract extraction status, left eye: Secondary | ICD-10-CM | POA: Insufficient documentation

## 2017-01-22 HISTORY — PX: PPM GENERATOR CHANGEOUT: EP1233

## 2017-01-22 LAB — SURGICAL PCR SCREEN
MRSA, PCR: NEGATIVE
Staphylococcus aureus: POSITIVE — AB

## 2017-01-22 SURGERY — PPM GENERATOR CHANGEOUT

## 2017-01-22 MED ORDER — MUPIROCIN 2 % EX OINT
TOPICAL_OINTMENT | Freq: Once | CUTANEOUS | Status: AC
  Administered 2017-01-22: 13:00:00 via NASAL

## 2017-01-22 MED ORDER — ACETAMINOPHEN 325 MG PO TABS: 325.0000 mg | ORAL_TABLET | ORAL | Status: DC | PRN

## 2017-01-22 MED ORDER — CHLORHEXIDINE GLUCONATE 4 % EX LIQD
60.0000 mL | Freq: Once | CUTANEOUS | Status: DC
  Filled 2017-01-22: qty 60

## 2017-01-22 MED ORDER — FENTANYL CITRATE (PF) 100 MCG/2ML IJ SOLN
INTRAMUSCULAR | Status: DC | PRN
  Administered 2017-01-22: 25 ug via INTRAVENOUS

## 2017-01-22 MED ORDER — SODIUM CHLORIDE 0.9% FLUSH
3.0000 mL | INTRAVENOUS | Status: DC | PRN
Start: 2017-01-22 — End: 2017-01-22

## 2017-01-22 MED ORDER — MUPIROCIN 2 % EX OINT
TOPICAL_OINTMENT | CUTANEOUS | Status: AC
  Filled 2017-01-22: qty 22

## 2017-01-22 MED ORDER — VANCOMYCIN HCL IN DEXTROSE 1-5 GM/200ML-% IV SOLN
1000.0000 mg | INTRAVENOUS | Status: AC
  Administered 2017-01-22: 1000 mg via INTRAVENOUS
  Filled 2017-01-22: qty 200

## 2017-01-22 MED ORDER — LIDOCAINE HCL (PF) 1 % IJ SOLN
INTRAMUSCULAR | Status: AC
  Filled 2017-01-22: qty 60

## 2017-01-22 MED ORDER — SODIUM CHLORIDE 0.9 % IV SOLN
INTRAVENOUS | Status: DC
  Administered 2017-01-22: 14:00:00 via INTRAVENOUS

## 2017-01-22 MED ORDER — SODIUM CHLORIDE 0.9 % IR SOLN
80.0000 mg | Status: AC
  Administered 2017-01-22: 80 mg
  Filled 2017-01-22: qty 2

## 2017-01-22 MED ORDER — FENTANYL CITRATE (PF) 100 MCG/2ML IJ SOLN
INTRAMUSCULAR | Status: AC
  Filled 2017-01-22: qty 2

## 2017-01-22 MED ORDER — SODIUM CHLORIDE 0.9 % IV SOLN: 250.0000 mL | INTRAVENOUS | Status: DC | PRN

## 2017-01-22 MED ORDER — MIDAZOLAM HCL 5 MG/5ML IJ SOLN
INTRAMUSCULAR | Status: DC | PRN
  Administered 2017-01-22: 1 mg via INTRAVENOUS

## 2017-01-22 MED ORDER — ONDANSETRON HCL 4 MG/2ML IJ SOLN: 4.0000 mg | Freq: Four times a day (QID) | INTRAMUSCULAR | Status: DC | PRN

## 2017-01-22 MED ORDER — VANCOMYCIN HCL IN DEXTROSE 1-5 GM/200ML-% IV SOLN
INTRAVENOUS | Status: AC
  Filled 2017-01-22: qty 200

## 2017-01-22 MED ORDER — MIDAZOLAM HCL 5 MG/5ML IJ SOLN
INTRAMUSCULAR | Status: AC
  Filled 2017-01-22: qty 5

## 2017-01-22 MED ORDER — SODIUM CHLORIDE 0.9% FLUSH: 3.0000 mL | Freq: Two times a day (BID) | INTRAVENOUS | Status: DC

## 2017-01-22 MED ORDER — SODIUM CHLORIDE 0.9 % IR SOLN
Status: AC
  Filled 2017-01-22: qty 2

## 2017-01-22 MED ORDER — LIDOCAINE HCL (PF) 1 % IJ SOLN
INTRAMUSCULAR | Status: DC | PRN
  Administered 2017-01-22: 45 mL

## 2017-01-22 SURGICAL SUPPLY — 6 items
CABLE SURGICAL S-101-97-12 (CABLE) ×1 IMPLANT
PACEMAKER ASSURITY DR-RF (Pacemaker) ×2 IMPLANT
PAD DEFIB LIFELINK (PAD) ×2 IMPLANT
POUCH AIGIS-R ANTIBACT PPM (Mesh General) ×2 IMPLANT
POUCH AIGIS-R ANTIBACT PPM MED (Mesh General) IMPLANT
TRAY PACEMAKER INSERTION (PACKS) ×1 IMPLANT

## 2017-01-22 NOTE — Interval H&P Note (Signed)
History and Physical Interval Note:  01/22/2017 1:02 PM  Adam [name] Adam [name]  has presented today for surgery, with the diagnosis of ERI  The various methods of treatment have been discussed with the patient and family. After consideration of risks, benefits and other options for treatment, the patient has consented to  Procedure(s): PPM GENERATOR CHANGEOUT (N/A) as a surgical intervention .  The patient's history has been reviewed, patient examined, no change in status, stable for surgery.  I have reviewed the patient's chart and labs.  Questions were answered to the patient's satisfaction.     Raechelle Sarti

## 2017-01-22 NOTE — Discharge Instructions (Signed)
Mupirocin nasal ointment What is this medicine? MUPIROCIN CALCIUM (myoo PEER oh sin KAL see um) is an antibiotic. It is used inside the nose to treat infections that are caused by certain bacteria. This helps prevent the spread of infection to patients and health care workers during outbreaks at institutions. This medicine may be used for other purposes; ask your health care provider or pharmacist if you have questions. COMMON BRAND NAME(S): Bactroban What should I tell my health care provider before I take this medicine? They need to know if you have any of these conditions: -an unusual or allergic reaction to mupirocin, other medicines, foods, dyes, or preservatives -pregnant or trying to get pregnant -breast-feeding How should I use this medicine? This medicine is only for use inside the nose. Follow the directions on the prescription label. Wash your hands before and after use. Squeeze half the contents of a single-use tube into one nostril, then squeeze the other half into the other nostril. Press the sides of your nose together and gently massage after application to spread the ointment throughout the nostrils. Do not use your medicine more often than directed. Finish the full course of medicine prescribed by your doctor or health care professional even if you think your condition is better. Talk to your pediatrician regarding the use of this medicine in children. Special care may be needed. Overdosage: If you think you have taken too much of this medicine contact a poison control center or emergency room at once. NOTE: This medicine is only for you. Do not share this medicine with others. What if I miss a dose? If you miss a dose, take it as soon as you can. If it is almost time for your next dose, take only that dose. Do not take double or extra doses. What may interact with this medicine? Interactions are not expected. Do not use any other nose products without telling your doctor or  health care professional. This list may not describe all possible interactions. Give your health care provider a list of all the medicines, herbs, non-prescription drugs, or dietary supplements you use. Also tell them if you smoke, drink alcohol, or use illegal drugs. Some items may interact with your medicine. What should I watch for while using this medicine? If your nose is severely irritated, burning or stinging from use of this medicine, stop using it and contact your doctor or health care professional. Do not get this medicine in your eyes. If you do, rinse out with plenty of cool tap water. What side effects may I notice from receiving this medicine? Side effects that you should report to your doctor or health care professional as soon as possible: -severe irritation, burning, stinging, or pain Side effects that usually do not require medical attention (report to your doctor or health care professional if they continue or are bothersome): -altered taste -cough -headache -skin itching -sore throat -stuffy or runny nose This list may not describe all possible side effects. Call your doctor for medical advice about side effects. You may report side effects to FDA at 1-800-FDA-1088. Where should I keep my medicine? Keep out of the reach of children. Store at room temperature between 15 and 30 degrees C (59 and 86 degrees F). Do not refrigerate. One tube of ointment is for single use in both nostrils. Throw away after use. NOTE: This sheet is a summary. It may not cover all possible information. If you have questions about this medicine, talk to your doctor, pharmacist, or  health care provider.  2018 Elsevier/Gold Standard (2007-08-10 14:36:10) Supplemental Discharge Instructions for  Pacemaker/Defibrillator Patients  Activity No restrictions. DO wear your seatbelt, even if it crosses over the pacemaker site.  WOUND CARE - Keep the wound area clean and dry.  Remove the dressing the day  after you return home (usually 48 hours after the procedure). - DO NOT SUBMERGE UNDER WATER UNTIL FULLY HEALED (no tub baths, hot tubs, swimming pools, etc.).  - You  may shower or take a sponge bath after the dressing is removed. DO NOT SOAK the area and do not allow the shower to directly spray on the site. - If you have staples, these will be removed in the office in 7-14 days. - If you have tape/steri-strips on your wound, these will fall off; do not pull them off prematurely.   - No bandage is needed on the site.  DO  NOT apply any creams, oils, or ointments to the wound area. - If you notice any drainage or discharge from the wound, any swelling, excessive redness or bruising at the site, or if you develop a fever > 101? F after you are discharged home, call the office at once.  Special Instructions - You are still able to use cellular telephones.  Avoid carrying your cellular phone near your device. - When traveling through airports, show security personnel your identification card to avoid being screened in the metal detectors.  - Avoid arc welding equipment, MRI testing (magnetic resonance imaging), TENS units (transcutaneous nerve stimulators).  Call the office for questions about other devices. - Avoid electrical appliances that are in poor condition or are not properly grounded. - Microwave ovens are safe to be near or to operate.

## 2017-01-22 NOTE — Progress Notes (Signed)
Pt's wife came into room and asked pt about a bandaid on his left medial forearm. There is a bandaid on forearm. No bleeding noted. Pt stated that "one of the other nurses put that on because she saw some blood"  Pt has many bruises visible on arms and is not concerned about this new bandaid.

## 2017-01-22 NOTE — Op Note (Addendum)
Procedure report  Procedure performed:  1. Dual chamber pacemaker generator changeout  2. Light sedation  Reason for procedure:  1. Device generator at elective replacement interval  2. SSS (atrial standstill) Procedure performed by:  [name], MD  Complications:  None  Estimated blood loss:  <5 mL  Medications administered during procedure:  Vancomycin 1 g intravenously, lidocaine 1% 30 mL locally, fentanyl 25 mcg intravenously, Versed 1 mg intravenously  During this procedure the patient is administered a total of Versed 1 mg and Fentanyl 25 mcg to achieve and maintain moderate conscious sedation.  The patient's heart rate, blood pressure, and oxygen saturation are monitored continuously during the procedure. The period of conscious sedation is 32 minutes, of which I was present face-to-face 100% of this time.  Device details:   Web designer. Jude Assurity model number 2272, serial number [id number] Right atrial lead (chronic) St. Jude Isoflex S, model number [id number], serial number [id number] (implanted 04/12/2008) Right ventricular lead (chronic)  St. Jude Tendril ST, model number [id number], serial number [id number] (implanted 04/12/2008)  Explanted generator St. Jude Accent DR ,  model number [id number], serial number  [id number] (implanted 04/12/2008)  Procedure details:  After the risks and benefits of the procedure were discussed the patient provided informed consent. She was brought to the cardiac catheter lab in the fasting state. The patient was prepped and draped in usual sterile fashion. Local anesthesia with 1% lidocaine was administered to to the left infraclavicular area. A 5-6cm horizontal incision was made parallel with and 2-3 cm caudal to the left clavicle, in the area of an old scar. An older scar was seen closer to the left clavicle. Using minimal electrocautery and mostly sharp and blunt dissection the prepectoral pocket was opened carefully to avoid injury to the loops of  chronic leads. Extensive dissection was not necessary. The device was explanted. The pocket was carefully inspected for hemostasis and flushed with copious amounts of antibiotic solution.  The leads were disconnected from the old generator and testing of the lead parameters later showed excellent values. The new generator was connected to the chronic leads, with appropriate pacing noted.   Due to the potentially serious implications of infection in this "atrially dependent" patient, a Tyrx pouch was used. The device and redundant lead loops were placed in the pouch.  The entire system was then carefully inserted in the pocket with care been taking that the leads and device assumed a comfortable position without pressure on the incision. Great care was taken that the leads be located deep to the generator. The pocket was then closed in layers using 2 layers of 2-0 Vicryl and cutaneous staples after which a sterile dressing was applied.   At the end of the procedure the following lead parameters were encountered:   Right atrial lead sensed P waves none detected, impedance 510 ohms, threshold 0.75 at 0.4 ms pulse width.  Right ventricular lead sensed R waves  12.0 mV, impedance 410 ohms, threshold 0.75 at 0.4 ms pulse width.  [name], MD, [facility] [phone] office [phone] pager

## 2017-01-23 ENCOUNTER — Encounter (HOSPITAL_COMMUNITY): Payer: Self-pay | Admitting: Cardiovascular Disease

## 2017-01-29 ENCOUNTER — Telehealth: Payer: Self-pay | Admitting: Cardiovascular Disease

## 2017-01-29 ENCOUNTER — Ambulatory Visit: Payer: Medicare Other

## 2017-01-29 NOTE — Telephone Encounter (Signed)
Please call,pt had his battery changed in pacemaker last week. She noticed when she removed the bandage there was fluid coming  From the incision.Please call to advise.

## 2017-01-29 NOTE — Telephone Encounter (Signed)
Spoke with patient who reported that his wife removed the outer dressing this morning with the gauze and tegaderm but that the steristrips additionally came off with the outer dressing. He reported a scant amount of bloody drainage. Patient was agreeable to DC appointment today at 3pm.

## 2017-01-30 ENCOUNTER — Ambulatory Visit (INDEPENDENT_AMBULATORY_CARE_PROVIDER_SITE_OTHER): Payer: Self-pay | Admitting: *Deleted

## 2017-01-30 DIAGNOSIS — Z950 Presence of cardiac pacemaker: Secondary | ICD-10-CM

## 2017-01-30 DIAGNOSIS — R001 Bradycardia, unspecified: Secondary | ICD-10-CM

## 2017-01-30 NOTE — Progress Notes (Signed)
Adam [name] presents to the [facility] today at the request of his home care providers. They have been concerned about his incision post PPM generator replacement.  Left chest incision is clean, dry and intact. Staples are intact. No redness, swelling or drainage noted. Mild ecchymosis noted inferior to incision that is in later stages of healing. No intervention necessary. Staples to be removed at wound check 02/05/17. Confirmed appointment with patient and wife. They are agreeable to the plan and are appreciative.

## 2017-01-31 ENCOUNTER — Other Ambulatory Visit: Payer: Self-pay | Admitting: Cardiovascular Disease

## 2017-02-05 ENCOUNTER — Ambulatory Visit: Payer: Medicare Other

## 2017-02-05 ENCOUNTER — Ambulatory Visit: Payer: Medicare Other | Admitting: Adult Health

## 2017-02-05 ENCOUNTER — Ambulatory Visit (INDEPENDENT_AMBULATORY_CARE_PROVIDER_SITE_OTHER): Payer: Medicare Other | Admitting: *Deleted

## 2017-02-05 DIAGNOSIS — Z950 Presence of cardiac pacemaker: Secondary | ICD-10-CM | POA: Diagnosis not present

## 2017-02-05 DIAGNOSIS — I495 Sick sinus syndrome: Secondary | ICD-10-CM

## 2017-02-05 DIAGNOSIS — R001 Bradycardia, unspecified: Secondary | ICD-10-CM | POA: Diagnosis not present

## 2017-02-05 LAB — CUP PACEART INCLINIC DEVICE CHECK
Battery Remaining Longevity: 110 mo
Battery Voltage: 3.01 V
Brady Statistic RA Percent Paced: 99.12 %
Brady Statistic RV Percent Paced: 64 %
Date Time Interrogation Session: 20190102161707
Implantable Lead Implant Date: 20100309
Implantable Lead Implant Date: 20100309
Implantable Lead Location: 753859
Implantable Lead Location: 753860
Implantable Pulse Generator Implant Date: 20181219
Lead Channel Impedance Value: 412.5 Ohm
Lead Channel Impedance Value: 537.5 Ohm
Lead Channel Pacing Threshold Amplitude: 0.5 V
Lead Channel Pacing Threshold Amplitude: 0.75 V
Lead Channel Pacing Threshold Pulse Width: 0.4 ms
Lead Channel Pacing Threshold Pulse Width: 0.4 ms
Lead Channel Sensing Intrinsic Amplitude: 12 mV
Lead Channel Setting Pacing Amplitude: 0.875
Lead Channel Setting Pacing Amplitude: 2.5 V
Lead Channel Setting Pacing Pulse Width: 0.4 ms
Lead Channel Setting Sensing Sensitivity: 2 mV
Pulse Gen Model: 2272
Pulse Gen Serial Number: 8965776

## 2017-02-05 NOTE — Progress Notes (Signed)
Wound check appointment. Staples removed. Wound without redness. Mild edema noted over device site, diffuse borders, soft to palpation, improved in recent days per wife. Yellowish, healing ecchymosis noted inferior to incision. Incision edges approximated and healing well. Normal device function. Thresholds, sensing, and impedances consistent with implant measurements. Device programmed at chronic output in RA, auto capture programmed on in RV. Histogram distribution appropriate for patient and level of activity. No mode switches or high ventricular rates noted. Patient educated about wound care, arm mobility, lifting restrictions, and Merlin monitor. Patient and wife will plan to move monitor to a room where he spends time to encourage monitor automaticity. ROV with [facility] on 04/30/17.

## 2017-02-06 ENCOUNTER — Telehealth: Payer: Self-pay | Admitting: *Deleted

## 2017-02-06 NOTE — Telephone Encounter (Signed)
LMOM (DPR) advising that patient does not need to keep 02/27/17 appointment with Dr. Sallyanne Kuster as he recently underwent a PPM gen change procedure on 01/22/17.  Advised that appointment has been cancelled and that he should keep 04/30/17 Dr. Sallyanne Kuster appointment per discussion on 02/05/17 during wound check appointment.  [facility] phone number for questions/concerns.

## 2017-02-18 ENCOUNTER — Telehealth: Payer: Self-pay | Admitting: *Deleted

## 2017-02-18 NOTE — Telephone Encounter (Signed)
Spoke with patient to advise him that his Merlin monitor is working correctly and that it was last updated on 02/13/17.  Patient is appreciative of confirmation and denies additional questions or concerns at this time.

## 2017-02-20 ENCOUNTER — Encounter: Payer: Self-pay | Admitting: Cardiovascular Disease

## 2017-02-27 ENCOUNTER — Ambulatory Visit: Payer: Medicare Other | Admitting: Cardiovascular Disease

## 2017-02-27 ENCOUNTER — Telehealth: Payer: Self-pay

## 2017-02-27 NOTE — Telephone Encounter (Signed)
Patient's daughter Caryl Asp called requesting order for Occupational Therapy in order for patient to get a grab bar installed in the bathroom. This is a requirement for [facility]   Please call patient's daughter once order complete

## 2017-02-28 ENCOUNTER — Other Ambulatory Visit: Payer: Self-pay | Admitting: Cardiology

## 2017-02-28 ENCOUNTER — Other Ambulatory Visit: Payer: Self-pay | Admitting: Internal Medicine

## 2017-02-28 NOTE — Telephone Encounter (Signed)
WILL BE PROVIDED

## 2017-02-28 NOTE — Telephone Encounter (Signed)
REFILL 

## 2017-03-18 ENCOUNTER — Emergency Department (HOSPITAL_COMMUNITY)
Admission: EM | Admit: 2017-03-18 | Discharge: 2017-03-18 | Disposition: A | Discharge status: Home / Self Care | Payer: Medicare Other | Attending: Emergency Medicine | Admitting: Emergency Medicine

## 2017-03-18 ENCOUNTER — Other Ambulatory Visit: Payer: Self-pay

## 2017-03-18 ENCOUNTER — Encounter (HOSPITAL_COMMUNITY): Payer: Self-pay | Admitting: *Deleted

## 2017-03-18 DIAGNOSIS — Z466 Encounter for fitting and adjustment of urinary device: Secondary | ICD-10-CM | POA: Insufficient documentation

## 2017-03-18 DIAGNOSIS — N4889 Other specified disorders of penis: Secondary | ICD-10-CM | POA: Insufficient documentation

## 2017-03-18 LAB — CBC
HCT: 40.4 % (ref 39.0–52.0)
Hemoglobin: 13.4 g/dL (ref 13.0–17.0)
MCH: 32.3 pg (ref 26.0–34.0)
MCHC: 33.2 g/dL (ref 30.0–36.0)
MCV: 97.3 fL (ref 78.0–100.0)
Platelets: 224 10*3/uL (ref 150–400)
RBC: 4.15 MIL/uL — ABNORMAL LOW (ref 4.22–5.81)
RDW: 14.6 % (ref 11.5–15.5)
WBC: 5.9 10*3/uL (ref 4.0–10.5)

## 2017-03-18 LAB — URINALYSIS, ROUTINE W REFLEX MICROSCOPIC
Bilirubin Urine: NEGATIVE
Glucose, UA: NEGATIVE mg/dL
Ketones, ur: NEGATIVE mg/dL
Nitrite: POSITIVE — AB
Protein, ur: 30 mg/dL — AB
Specific Gravity, Urine: 1.02 (ref 1.005–1.030)
Squamous Epithelial / LPF: NONE SEEN
pH: 5 (ref 5.0–8.0)

## 2017-03-18 LAB — I-STAT CHEM 8, ED
BUN: 10 mg/dL (ref 6–20)
Calcium, Ion: 1.13 mmol/L — ABNORMAL LOW (ref 1.15–1.40)
Chloride: 104 mmol/L (ref 101–111)
Creatinine, Ser: 0.7 mg/dL (ref 0.61–1.24)
Glucose, Bld: 105 mg/dL — ABNORMAL HIGH (ref 65–99)
HCT: 41 % (ref 39.0–52.0)
Hemoglobin: 13.9 g/dL (ref 13.0–17.0)
Potassium: 4.3 mmol/L (ref 3.5–5.1)
Sodium: 138 mmol/L (ref 135–145)
TCO2: 27 mmol/L (ref 22–32)

## 2017-03-18 MED ORDER — CEPHALEXIN 500 MG PO CAPS: 500.0000 mg | ORAL_CAPSULE | Freq: Three times a day (TID) | ORAL | 0 refills | Status: AC

## 2017-03-18 MED ORDER — ROPINIROLE HCL 0.5 MG PO TABS
0.5000 mg | ORAL_TABLET | Freq: Once | ORAL | Status: AC
  Administered 2017-03-18: 0.5 mg via ORAL
  Filled 2017-03-18: qty 1

## 2017-03-18 MED ORDER — TRAZODONE HCL 50 MG PO TABS
50.0000 mg | ORAL_TABLET | Freq: Once | ORAL | Status: AC
  Administered 2017-03-18: 50 mg via ORAL
  Filled 2017-03-18: qty 1

## 2017-03-18 MED ORDER — CEPHALEXIN 500 MG PO CAPS
500.0000 mg | ORAL_CAPSULE | Freq: Once | ORAL | Status: AC
  Administered 2017-03-18: 500 mg via ORAL
  Filled 2017-03-18: qty 1

## 2017-03-18 MED ORDER — CIPROFLOXACIN HCL 500 MG PO TABS: 500.0000 mg | ORAL_TABLET | Freq: Once | ORAL | Status: DC

## 2017-03-18 NOTE — ED Triage Notes (Signed)
Pt had Cath changed yesterday and has been bleeding around cath. Amount has increased this afternoon

## 2017-03-18 NOTE — ED Provider Notes (Signed)
[facility] DEPT Provider Note   CSN: [id number] Arrival date & time: 03/18/17  1859     History   Chief Complaint Chief Complaint  Patient presents with  . Urinary Cath Bleeding    HPI Adam [name] is a 84 y.o. male.  HPI   84 year old male with past medical history as below with chronic urinary retention here with bleeding from his catheter site.  The patient has a chronic, indwelling Foley catheter.  This was replaced in the urology office yesterday.  Patient is on aspirin and Plavix.  He states the replacement well but since then, he has had intermittent blood in his boxers.  Earlier today, the patient stood up to go the bathroom and he noticed a small amount of red drainage and bleeding from his urethra.  He has not had any associated chest pain, shortness of breath, lightheadedness, or dizziness.  No syncopal episodes.  Denies any change in his urine.  Denies any fevers or chills.  He is otherwise well and at his baseline state of health.  He is been taking his medications as prescribed.  No history of previous bleeding from his urethra.  No history of anemia.  Past Medical History:  Diagnosis Date  . Arthritis    "minor, back and sometimes knees" (12/15/2012)  . Bradycardia    AFib/SSS s/p St Jude PPM 04/12/2008  . CAD (coronary artery disease) 12/30/2014   CABG (LIMA-LAD, SVG-RCA, SVG-OM in 1996).  07/2009 BMS to SVG-RCA. Cath in 04/2010 with patent stents   . Cardiomyopathy, ischemic 08/25/2012  . CHF (congestive heart failure) ([place])   . Chronic knee pain 12/03/2014  . Combined congestive systolic and diastolic heart failure ([place]) 02/02/2015   Hx EF 41%. BNP 96.8 02/21/15 Torsemide 04/06/15 Na 142, K 4.6, Bun 16, creat 0.89 04/20/14 BNP 111.7, Na 142, K 4.6, Bun 16, creat 0.9   . Depression with anxiety 02/02/2015   02/21/15 Hgb A1c 5.8 03/10/15 MMSE 30/30   . Dizziness, after diuretic asscoiated with hypotension and responded to fluid bolus 06/05/2011    04/28/15 US carotid R+L normal bilateral arterial velocities.    Marland Kitchen Dyspnea 08/11/2014   Followed in Pulmonary clinic/ [facility]/ [facility]  - 08/11/2014  Walked RA x 1 laps @ 185 ft each stopped due to fatigue/off balance/ slow pace/  no sob or desat  - PFT's  09/26/2014  FEV1 2.26 (85 % ) ratio 76  p no % improvement from saba with DLCO  67 % corrects to 93 % for alv volume      Since prev study 08/04/13 minimal change lung vol or dlco    . Embolic cerebral infarction ([place]) 12/06/2015  . Exertional shortness of breath    "sometimes walking" (12/15/2012)  . GERD (gastroesophageal reflux disease)   . Gout 02/09/2015  . Heart murmur    "just told I had one today" (12/15/2012)  . Hiatal hernia   . Hyperlipidemia   . Hypertension   . Hypothyroid   . Influenza A 03/10/2016  . Insomnia   . Melanoma of back ([place]) 1976  . Myocardial infarction [facility]) 1996; 2011   "both silent" (12/15/2012)  . Nonrheumatic aortic valve stenosis   . Orthostatic hypotension   . Osteoporosis, senile   . Pacemaker   . RBBB   . Restless leg 02/02/2015  . Right leg weakness 12/06/2015  . [facility] spotted fever   . S/P CABG x 4   . Sick sinus syndrome ([place])  01/31/2014  . Sustained ventricular tachycardia ([place]) 07/27/2014  . Urinary retention 12/30/2014    Patient Active Problem List   Diagnosis Date Noted  . Pacemaker battery depletion 01/22/2017  . Decubitus ulcer of sacral region, stage 2 06/11/2016  . Osteoarthritis 04/23/2016  . Gingivitis 04/15/2016  . Weak 03/14/2016  . AKI (acute kidney injury) ([place]) 03/11/2016  . History of arterial ischemic stroke 03/05/2016  . Rash and nonspecific skin eruption 02/27/2016  . Nonrheumatic aortic valve stenosis   . TIA (transient ischemic attack) 01/10/2016  . Atypical chest pain 01/10/2016  . Hypotension 01/10/2016  . Hx of CABG 01/10/2016  . Chronic anticoagulation 12/22/2015  . Embolic cerebral infarction ([place]) 12/06/2015  . Urinary tract infection  04/06/2015  . Insomnia 03/23/2015  . Gout 02/09/2015  . Depression with anxiety 02/02/2015  . RLS (restless legs syndrome) 02/02/2015  . Combined congestive systolic and diastolic heart failure ([place]) 02/02/2015  . Urinary frequency 02/02/2015  . Pressure ulcer 01/01/2015  . Urinary retention 12/30/2014  . Coronary artery disease involving native coronary artery of native heart without angina pectoris 12/30/2014  . Expressive aphasia 12/30/2014  . Chronic constipation   . Orthostatic hypotension   . Chronic knee pain 12/03/2014  . Shortness of breath 12/03/2014  . GERD (gastroesophageal reflux disease)   . Hypertension   . Dyspnea on exertion 08/11/2014  . Hypothyroidism 08/11/2014  . VT (ventricular tachycardia) ([place]) 07/27/2014  . Upper airway cough syndrome 07/14/2014  . SSS (sick sinus syndrome) ([place]) 01/31/2014  . Hyperlipidemia 01/30/2014  . Cardiomyopathy, ischemic 08/25/2012  . Pacemaker 08/25/2012  . Dizzy 06/05/2011  . Bradycardia     Past Surgical History:  Procedure Laterality Date  . CARDIAC CATHETERIZATION  04/2010   LIMA to LAD patent,SVG to OM patent,no in-stnet restenosis RCA  . CATARACT EXTRACTION W/ INTRAOCULAR LENS  IMPLANT, BILATERAL Bilateral 2012  . CORONARY ANGIOPLASTY WITH STENT PLACEMENT  07/2009   bare metal stent to SVG to the RCA  . CORONARY ARTERY BYPASS GRAFT  1996   LIMA to LAD,SVG to RCA & SVG to OM  . INSERT / REPLACE / REMOVE PACEMAKER  2010  . MELANOMA EXCISION  05/1974 X2   "taken off my back" (12/15/2012)  . NM MYOVIEW LTD  06/2011   low risk  . PPM GENERATOR CHANGEOUT N/A 01/22/2017   Procedure: PPM GENERATOR CHANGEOUT;  Surgeon: [name], MD;  Location: [facility] CV LAB;  Service: Cardiovascular;  Laterality: N/A;  . TONSILLECTOMY  1938  . TRANSURETHRAL RESECTION OF PROSTATE  1986  . US ECHOCARDIOGRAPHY  07/11/2009   EF 45-50%       Home Medications    Prior to Admission medications   Medication Sig Start Date End Date  Taking? Authorizing Provider  acetaminophen (TYLENOL) 325 MG tablet Take 650 mg by mouth as needed for moderate pain or headache.    Yes [provider]  amiodarone (PACERONE) 100 MG tablet Take 1 tablet (100 mg total) by mouth daily. 01/16/17  Yes Croitoru, Mihai, MD  aspirin EC 81 MG tablet Take 1 tablet (81 mg total) by mouth daily. 01/12/16  Yes Reyne Dumas, MD  cetirizine (ZYRTEC) 10 MG tablet Take 10 mg by mouth at bedtime.    Yes [provider]  Cholecalciferol (VITAMIN D3) 5000 UNITS CAPS Take 5,000 Units by mouth every Monday.    Yes [provider]  Clobetasol Propionate 0.05 % lotion Apply 1 application topically 2 (two) times daily.   Yes [provider]  clopidogrel (PLAVIX) 75 MG tablet TAKE 1 TABLET ONCE DAILY. Patient taking differently: Take 75mg  by mouth daily 12/30/16  Yes Blanchie Serve, MD  guaiFENesin (MUCINEX) 600 MG 12 hr tablet Take 600 mg by mouth daily.    Yes [provider]  ketoconazole (NIZORAL) 2 % cream Apply 1 application topically 2 (two) times daily.   Yes [provider]  levothyroxine (SYNTHROID, LEVOTHROID) 137 MCG tablet Take 1 tablet (137 mcg total) by mouth daily before breakfast. 08/14/16  Yes Bubba Camp, Mahima, MD  midodrine (PROAMATINE) 10 MG tablet TAKE 1 TABLET THREE TIMES DAILY. TAKE LAST DOSE AT LEAST 6 HOURS BEFORE BEDTIME. Patient taking differently: Take 10mg  three times daily. Take last dose at least 6 hours before bedtime. 01/13/17  Yes Pandey, Mahima, MD  pantoprazole (PROTONIX) 40 MG tablet TAKE 1 TABLET BY MOUTH TWICE DAILY. Patient taking differently: Take 40mg  by mouth daily 02/28/17  Yes [place], Lurena Joiner K, PA-C  polyethylene glycol (MIRALAX / GLYCOLAX) packet Take 17 g by mouth 3 (three) times daily.    Yes [provider]  pravastatin (PRAVACHOL) 40 MG tablet Take 1 tablet (40 mg total) by mouth at bedtime. 10/21/16  Yes Pandey, Mahima, MD  rOPINIRole (REQUIP) 0.5 MG tablet TAKE 1  TABLET 3 TIMES A DAY AS NEEDED. Patient taking differently: Take 1 tablet  (0.5mg ) three times daily as needed. 01/22/17  Yes Blanchie Serve, MD  sennosides-docusate sodium (SENOKOT-S) 8.6-50 MG tablet Take 2 tablets by mouth as needed for constipation.    Yes [provider]  torsemide (DEMADEX) 10 MG tablet Take 10 mg by mouth daily as needed (swelling).   Yes [provider]  traZODone (DESYREL) 100 MG tablet Take 1 tablet (100 mg total) by mouth at bedtime. May take an additional 25mg  at bedtime as needed for sleep. Patient taking differently: Take 50 mg by mouth at bedtime. Patient takes one-half tablet by mouth each night at bedtime 03/03/17  Yes Blanchie Serve, MD  white petrolatum (VASELINE) GEL Apply 1 application topically as needed (rectal irritation).   Yes [provider]  cephALEXin (KEFLEX) 500 MG capsule Take 1 capsule (500 mg total) by mouth 3 (three) times daily for 7 days. 03/18/17 03/25/17  Duffy Bruce, MD  traZODone (DESYREL) 50 MG tablet Take 50 mg by mouth at bedtime. May take an additional 25 mg as needed for sleep    [provider]    Family History Family History  Problem Relation Age of Onset  . Coronary artery disease Mother   . Diabetes Mother   . Heart disease Mother   . Coronary artery disease Father   . Diabetes Father   . Lung cancer Father   . Heart disease Father   . Anuerysm Son   . Heart disease Brother     Social History Social History   Tobacco Use  . Smoking status: Never Smoker  . Smokeless tobacco: Never Used  Substance Use Topics  . Alcohol use: No    Alcohol/week: 0.0 oz  . Drug use: No     Allergies   Altace [ramipril]; Crestor [rosuvastatin calcium]; Penicillins; and Sulfa antibiotics   Review of Systems Review of Systems  Constitutional: Negative for chills, fatigue and fever.  HENT: Negative for congestion and rhinorrhea.   Eyes: Negative for visual disturbance.  Respiratory: Negative  for cough, shortness of breath and wheezing.   Cardiovascular: Negative for chest pain and leg swelling.  Gastrointestinal: Negative for abdominal pain, diarrhea, nausea and  vomiting.  Genitourinary: Positive for discharge (Bleeding). Negative for dysuria and flank pain.  Musculoskeletal: Negative for neck pain and neck stiffness.  Skin: Negative for rash and wound.  Allergic/Immunologic: Negative for immunocompromised state.  Neurological: Negative for syncope, weakness and headaches.  All other systems reviewed and are negative.    Physical Exam Updated Vital Signs BP 130/70 (BP Location: Right Arm)   Pulse 70   Temp 97.6 F (36.4 C) (Oral)   Resp 17   Ht 5\' 10"  (1.778 m)   Wt 75.8 kg (167 lb)   SpO2 96%   BMI 23.96 kg/m   Physical Exam  Constitutional: He is oriented to person, place, and time. He appears well-developed and well-nourished. No distress.  HENT:  Head: Normocephalic and atraumatic.  Eyes: Conjunctivae are normal.  Neck: Neck supple.  Cardiovascular: Normal rate, regular rhythm and normal heart sounds. Exam reveals no friction rub.  No murmur heard. Pulmonary/Chest: Effort normal and breath sounds normal. No respiratory distress. He has no wheezes. He has no rales.  Abdominal: He exhibits no distension.  Genitourinary:  Genitourinary Comments: 73 French Foley catheter in place.  Urethral meatus without apparent bleeding.  There is dried blood on the Foley catheter but no active bleeding.  Foley is draining cloudy yellow urine without overt hematuria.  Testes descended bilaterally nontender.  Glans of the penis appears well perfused.  Musculoskeletal: He exhibits no edema.  Neurological: He is alert and oriented to person, place, and time. He exhibits normal muscle tone.  Skin: Skin is warm. Capillary refill takes less than 2 seconds.  Psychiatric: He has a normal mood and affect.  Nursing note and vitals reviewed.    ED Treatments / Results  Labs (all  labs ordered are listed, but only abnormal results are displayed) Labs Reviewed  CBC - Abnormal; Notable for the following components:      Result Value   RBC 4.15 (*)    All other components within normal limits  URINALYSIS, ROUTINE W REFLEX MICROSCOPIC - Abnormal; Notable for the following components:   APPearance CLOUDY (*)    Hgb urine dipstick MODERATE (*)    Protein, ur 30 (*)    Nitrite POSITIVE (*)    Leukocytes, UA LARGE (*)    Bacteria, UA FEW (*)    All other components within normal limits  I-STAT CHEM 8, ED - Abnormal; Notable for the following components:   Glucose, Bld 105 (*)    Calcium, Ion 1.13 (*)    All other components within normal limits  URINE CULTURE    EKG  EKG Interpretation None       Radiology No results found.  Procedures Procedures (including critical care time)  Medications Ordered in ED Medications  rOPINIRole (REQUIP) tablet 0.5 mg (0.5 mg Oral Given 03/18/17 2249)  traZODone (DESYREL) tablet 50 mg (50 mg Oral Given 03/18/17 2249)  cephALEXin (KEFLEX) capsule 500 mg (500 mg Oral Given 03/18/17 2250)     Initial Impression / Assessment and Plan / ED Course  I have reviewed the triage vital signs and the nursing notes.  Pertinent labs & imaging results that were available during my care of the patient were reviewed by me and considered in my medical decision making (see chart for details).     84 year old male here with bleeding around Foley catheter after recent replacement in urology clinic.  I suspect this is due to mild mucosal irritation from foley replacement, on ASA/plavix. UA concerning for possible UTI but  difficult to assess in setting of chronic indwelling cath. No flank pain or s/s pyelo. No fever or chills. Bleeding is controlled here. Discussed with Dr. Louis Meckel of Urology - likely mild mucosal irritation. Given control of bleeding here, can d/c with outpt follow-up. Return precautions given. Hgb is stable here.  Final  Clinical Impressions(s) / ED Diagnoses   Final diagnoses:  Penile bleeding    ED Discharge Orders        Ordered    cephALEXin (KEFLEX) 500 MG capsule  3 times daily     03/18/17 2247       Duffy Bruce, MD 03/18/17 [id number]

## 2017-03-18 NOTE — Discharge Instructions (Signed)
As we discussed, call urology tomorrow to set up an appointment.  If you have penile bleeding, where tight fitting underwear to apply mild pressure.  If bleeding is controlled after you are still and have worn a bandage and underwear, it is okay to wait till the morning to follow-up with your urologist.

## 2017-03-20 LAB — URINE CULTURE

## 2017-03-22 ENCOUNTER — Other Ambulatory Visit: Payer: Self-pay | Admitting: Internal Medicine

## 2017-04-07 ENCOUNTER — Encounter (HOSPITAL_COMMUNITY): Payer: Self-pay | Admitting: Emergency Medicine

## 2017-04-07 ENCOUNTER — Emergency Department (HOSPITAL_COMMUNITY)
Admission: EM | Admit: 2017-04-07 | Discharge: 2017-04-08 | Disposition: A | Discharge status: Home / Self Care | Payer: Medicare Other | Attending: Emergency Medicine | Admitting: Emergency Medicine

## 2017-04-07 ENCOUNTER — Other Ambulatory Visit: Payer: Self-pay

## 2017-04-07 DIAGNOSIS — Z7902 Long term (current) use of antithrombotics/antiplatelets: Secondary | ICD-10-CM | POA: Insufficient documentation

## 2017-04-07 DIAGNOSIS — Z951 Presence of aortocoronary bypass graft: Secondary | ICD-10-CM | POA: Diagnosis not present

## 2017-04-07 DIAGNOSIS — N4889 Other specified disorders of penis: Secondary | ICD-10-CM | POA: Insufficient documentation

## 2017-04-07 DIAGNOSIS — Z79899 Other long term (current) drug therapy: Secondary | ICD-10-CM | POA: Insufficient documentation

## 2017-04-07 DIAGNOSIS — I2510 Atherosclerotic heart disease of native coronary artery without angina pectoris: Secondary | ICD-10-CM | POA: Diagnosis not present

## 2017-04-07 DIAGNOSIS — E039 Hypothyroidism, unspecified: Secondary | ICD-10-CM | POA: Diagnosis not present

## 2017-04-07 DIAGNOSIS — Z7689 Persons encountering health services in other specified circumstances: Secondary | ICD-10-CM | POA: Diagnosis not present

## 2017-04-07 DIAGNOSIS — Z7982 Long term (current) use of aspirin: Secondary | ICD-10-CM | POA: Diagnosis not present

## 2017-04-07 DIAGNOSIS — R1030 Lower abdominal pain, unspecified: Secondary | ICD-10-CM | POA: Diagnosis present

## 2017-04-07 DIAGNOSIS — Z950 Presence of cardiac pacemaker: Secondary | ICD-10-CM | POA: Diagnosis not present

## 2017-04-07 DIAGNOSIS — I110 Hypertensive heart disease with heart failure: Secondary | ICD-10-CM | POA: Insufficient documentation

## 2017-04-07 DIAGNOSIS — Z955 Presence of coronary angioplasty implant and graft: Secondary | ICD-10-CM | POA: Insufficient documentation

## 2017-04-07 DIAGNOSIS — I509 Heart failure, unspecified: Secondary | ICD-10-CM | POA: Diagnosis not present

## 2017-04-07 MED ORDER — HYDROCODONE-ACETAMINOPHEN 5-325 MG PO TABS
1.0000 | ORAL_TABLET | Freq: Once | ORAL | Status: AC
  Administered 2017-04-08: 1 via ORAL
  Filled 2017-04-07: qty 1

## 2017-04-07 NOTE — ED Triage Notes (Signed)
Pt reports having pain in groin and penis that has been off and on for the last several days and worse at 3pm. Pt was given tylenol at 1700. Pt reports having foley catheter inserted before 03/18/17. Pt reports no relief with Tylenol.

## 2017-04-07 NOTE — ED Notes (Signed)
Bed: WA15 Expected date:  Expected time:  Means of arrival:  Comments: Triage 1  

## 2017-04-08 ENCOUNTER — Emergency Department (HOSPITAL_COMMUNITY): Payer: Medicare Other

## 2017-04-08 LAB — URINALYSIS, ROUTINE W REFLEX MICROSCOPIC
Bilirubin Urine: NEGATIVE
Glucose, UA: NEGATIVE mg/dL
Ketones, ur: 5 mg/dL — AB
Nitrite: NEGATIVE
Protein, ur: 30 mg/dL — AB
Specific Gravity, Urine: 1.02 (ref 1.005–1.030)
Squamous Epithelial / LPF: NONE SEEN
pH: 5 (ref 5.0–8.0)

## 2017-04-08 LAB — CBC WITH DIFFERENTIAL/PLATELET
Basophils Absolute: 0 10*3/uL (ref 0.0–0.1)
Basophils Relative: 0 %
Eosinophils Absolute: 0.2 10*3/uL (ref 0.0–0.7)
Eosinophils Relative: 2 %
HCT: 41.1 % (ref 39.0–52.0)
Hemoglobin: 13.7 g/dL (ref 13.0–17.0)
Lymphocytes Relative: 16 %
Lymphs Abs: 1.4 10*3/uL (ref 0.7–4.0)
MCH: 32.5 pg (ref 26.0–34.0)
MCHC: 33.3 g/dL (ref 30.0–36.0)
MCV: 97.4 fL (ref 78.0–100.0)
Monocytes Absolute: 0.9 10*3/uL (ref 0.1–1.0)
Monocytes Relative: 10 %
Neutro Abs: 6.2 10*3/uL (ref 1.7–7.7)
Neutrophils Relative %: 72 %
Platelets: 230 10*3/uL (ref 150–400)
RBC: 4.22 MIL/uL (ref 4.22–5.81)
RDW: 14.6 % (ref 11.5–15.5)
WBC: 8.6 10*3/uL (ref 4.0–10.5)

## 2017-04-08 LAB — BASIC METABOLIC PANEL
Anion gap: 8 (ref 5–15)
BUN: 15 mg/dL (ref 6–20)
CO2: 26 mmol/L (ref 22–32)
Calcium: 9 mg/dL (ref 8.9–10.3)
Chloride: 104 mmol/L (ref 101–111)
Creatinine, Ser: 0.93 mg/dL (ref 0.61–1.24)
GFR calc Af Amer: >60 mL/min (ref >60)
GFR calc non Af Amer: >60 mL/min (ref >60)
Glucose, Bld: 97 mg/dL (ref 65–99)
Potassium: 4.1 mmol/L (ref 3.5–5.1)
Sodium: 138 mmol/L (ref 135–145)

## 2017-04-08 MED ORDER — ROPINIROLE HCL 0.5 MG PO TABS
0.5000 mg | ORAL_TABLET | Freq: Once | ORAL | Status: AC
  Administered 2017-04-08: 0.5 mg via ORAL
  Filled 2017-04-08: qty 1

## 2017-04-08 MED ORDER — CEPHALEXIN 500 MG PO CAPS
500.0000 mg | ORAL_CAPSULE | Freq: Once | ORAL | Status: AC
  Administered 2017-04-08: 500 mg via ORAL
  Filled 2017-04-08: qty 1

## 2017-04-08 MED ORDER — CEPHALEXIN 500 MG PO CAPS: 500.0000 mg | ORAL_CAPSULE | Freq: Two times a day (BID) | ORAL | 0 refills | Status: DC

## 2017-04-08 NOTE — ED Provider Notes (Signed)
[facility] DEPT Provider Note   CSN: [id number] Arrival date & time: 04/07/17  1849     History   Chief Complaint Chief Complaint  Patient presents with  . Groin Pain  . foley catheter problem    HPI Adam [name] is a 84 y.o. male.  Patient presents with pain in his penis since about 3 PM.  He has a chronic indwelling Foley catheter for the last several years that was recently changed in the urology office on February 12.  States he has had intermittent pain on and off since then but is normally well controlled with Tylenol.  Today the pain is been constant not improving with Tylenol.  He describes pain within the shaft of his penis as well as his bilateral testicles.  The catheter still draining.  There is no fever or vomiting.  There is no abdominal pain or back pain.  He has had some discharge from his urethral meatus.  He denies any blood in his urine.  His urologist told him to take Tylenol for this pain but it is not helping today.   The history is provided by the patient and the spouse.  Groin Pain  Pertinent negatives include no chest pain, no abdominal pain, no headaches and no shortness of breath.    Past Medical History:  Diagnosis Date  . Arthritis    "minor, back and sometimes knees" (12/15/2012)  . Bradycardia    AFib/SSS s/p St Jude PPM 04/12/2008  . CAD (coronary artery disease) 12/30/2014   CABG (LIMA-LAD, SVG-RCA, SVG-OM in 1996).  07/2009 BMS to SVG-RCA. Cath in 04/2010 with patent stents   . Cardiomyopathy, ischemic 08/25/2012  . CHF (congestive heart failure) ([place])   . Chronic knee pain 12/03/2014  . Combined congestive systolic and diastolic heart failure ([place]) 02/02/2015   Hx EF 41%. BNP 96.8 02/21/15 Torsemide 04/06/15 Na 142, K 4.6, Bun 16, creat 0.89 04/20/14 BNP 111.7, Na 142, K 4.6, Bun 16, creat 0.9   . Depression with anxiety 02/02/2015   02/21/15 Hgb A1c 5.8 03/10/15 MMSE 30/30   . Dizziness, after diuretic asscoiated with  hypotension and responded to fluid bolus 06/05/2011   04/28/15 US carotid R+L normal bilateral arterial velocities.    Marland Kitchen Dyspnea 08/11/2014   Followed in Pulmonary clinic/ [facility]/ [place]  - 08/11/2014  Walked RA x 1 laps @ 185 ft each stopped due to fatigue/off balance/ slow pace/  no sob or desat  - PFT's  09/26/2014  FEV1 2.26 (85 % ) ratio 76  p no % improvement from saba with DLCO  67 % corrects to 93 % for alv volume      Since prev study 08/04/13 minimal change lung vol or dlco    . Embolic cerebral infarction ([place]) 12/06/2015  . Exertional shortness of breath    "sometimes walking" (12/15/2012)  . GERD (gastroesophageal reflux disease)   . Gout 02/09/2015  . Heart murmur    "just told I had one today" (12/15/2012)  . Hiatal hernia   . Hyperlipidemia   . Hypertension   . Hypothyroid   . Influenza A 03/10/2016  . Insomnia   . Melanoma of back ([place]) 1976  . Myocardial infarction [facility]) 1996; 2011   "both silent" (12/15/2012)  . Nonrheumatic aortic valve stenosis   . Orthostatic hypotension   . Osteoporosis, senile   . Pacemaker   . RBBB   . Restless leg 02/02/2015  . Right leg weakness 12/06/2015  .  [facility] spotted fever   . S/P CABG x 4   . Sick sinus syndrome ([place]) 01/31/2014  . Sustained ventricular tachycardia ([place]) 07/27/2014  . Urinary retention 12/30/2014    Patient Active Problem List   Diagnosis Date Noted  . Pacemaker battery depletion 01/22/2017  . Decubitus ulcer of sacral region, stage 2 06/11/2016  . Osteoarthritis 04/23/2016  . Gingivitis 04/15/2016  . Weak 03/14/2016  . AKI (acute kidney injury) ([place]) 03/11/2016  . History of arterial ischemic stroke 03/05/2016  . Rash and nonspecific skin eruption 02/27/2016  . Nonrheumatic aortic valve stenosis   . TIA (transient ischemic attack) 01/10/2016  . Atypical chest pain 01/10/2016  . Hypotension 01/10/2016  . Hx of CABG 01/10/2016  . Chronic anticoagulation 12/22/2015  . Embolic cerebral infarction  ([place]) 12/06/2015  . Urinary tract infection 04/06/2015  . Insomnia 03/23/2015  . Gout 02/09/2015  . Depression with anxiety 02/02/2015  . RLS (restless legs syndrome) 02/02/2015  . Combined congestive systolic and diastolic heart failure ([place]) 02/02/2015  . Urinary frequency 02/02/2015  . Pressure ulcer 01/01/2015  . Urinary retention 12/30/2014  . Coronary artery disease involving native coronary artery of native heart without angina pectoris 12/30/2014  . Expressive aphasia 12/30/2014  . Chronic constipation   . Orthostatic hypotension   . Chronic knee pain 12/03/2014  . Shortness of breath 12/03/2014  . GERD (gastroesophageal reflux disease)   . Hypertension   . Dyspnea on exertion 08/11/2014  . Hypothyroidism 08/11/2014  . VT (ventricular tachycardia) ([place]) 07/27/2014  . Upper airway cough syndrome 07/14/2014  . SSS (sick sinus syndrome) ([place]) 01/31/2014  . Hyperlipidemia 01/30/2014  . Cardiomyopathy, ischemic 08/25/2012  . Pacemaker 08/25/2012  . Dizzy 06/05/2011  . Bradycardia     Past Surgical History:  Procedure Laterality Date  . CARDIAC CATHETERIZATION  04/2010   LIMA to LAD patent,SVG to OM patent,no in-stnet restenosis RCA  . CATARACT EXTRACTION W/ INTRAOCULAR LENS  IMPLANT, BILATERAL Bilateral 2012  . CORONARY ANGIOPLASTY WITH STENT PLACEMENT  07/2009   bare metal stent to SVG to the RCA  . CORONARY ARTERY BYPASS GRAFT  1996   LIMA to LAD,SVG to RCA & SVG to OM  . INSERT / REPLACE / REMOVE PACEMAKER  2010  . MELANOMA EXCISION  05/1974 X2   "taken off my back" (12/15/2012)  . NM MYOVIEW LTD  06/2011   low risk  . PPM GENERATOR CHANGEOUT N/A 01/22/2017   Procedure: PPM GENERATOR CHANGEOUT;  Surgeon: [name], MD;  Location: [facility] CV LAB;  Service: Cardiovascular;  Laterality: N/A;  . TONSILLECTOMY  1938  . TRANSURETHRAL RESECTION OF PROSTATE  1986  . US ECHOCARDIOGRAPHY  07/11/2009   EF 45-50%       Home Medications    Prior to Admission  medications   Medication Sig Start Date End Date Taking? Authorizing Provider  acetaminophen (TYLENOL) 325 MG tablet Take 650 mg by mouth as needed for moderate pain or headache.    Yes [provider]  cetirizine (ZYRTEC) 10 MG tablet Take 10 mg by mouth daily as needed for allergies.    Yes [provider]  Cholecalciferol (VITAMIN D3) 5000 UNITS CAPS Take 5,000 Units by mouth every Monday.    Yes [provider]  Clobetasol Propionate 0.05 % lotion Apply 1 application topically 2 (two) times daily.   Yes [provider]  guaiFENesin (MUCINEX) 600 MG 12 hr tablet Take 600 mg by mouth daily as needed for cough.  Yes [provider]  polyethylene glycol (MIRALAX / GLYCOLAX) packet Take 17 g by mouth 3 (three) times daily.    Yes [provider]  pravastatin (PRAVACHOL) 40 MG tablet Take 1 tablet (40 mg total) by mouth at bedtime. 10/21/16  Yes Pandey, Mahima, MD  rOPINIRole (REQUIP) 0.5 MG tablet TAKE 1 TABLET 3 TIMES A DAY AS NEEDED. Patient taking differently: Take 1 tablet  (0.5mg ) three times daily as needed. 01/22/17  Yes Blanchie Serve, MD  sennosides-docusate sodium (SENOKOT-S) 8.6-50 MG tablet Take 2 tablets by mouth as needed for constipation.    Yes [provider]  torsemide (DEMADEX) 10 MG tablet Take 10 mg by mouth daily as needed (swelling).   Yes [provider]  traZODone (DESYREL) 50 MG tablet Take 50 mg by mouth at bedtime. May take an additional 25 mg as needed for sleep   Yes [provider]  white petrolatum (VASELINE) GEL Apply 1 application topically as needed (rectal irritation).   Yes [provider]  amiodarone (PACERONE) 100 MG tablet Take 1 tablet (100 mg total) by mouth daily. 01/16/17   Croitoru, Mihai, MD  aspirin EC 81 MG tablet Take 1 tablet (81 mg total) by mouth daily. 01/12/16   Reyne Dumas, MD  clopidogrel (PLAVIX) 75 MG tablet TAKE 1 TABLET ONCE DAILY. Patient taking  differently: Take 75mg  by mouth daily 12/30/16   Blanchie Serve, MD  levothyroxine (SYNTHROID, LEVOTHROID) 137 MCG tablet Take 1 tablet (137 mcg total) by mouth daily before breakfast. 08/14/16   Blanchie Serve, MD  midodrine (PROAMATINE) 10 MG tablet TAKE 1 TABLET THREE TIMES DAILY. TAKE LAST DOSE AT LEAST 6 HOURS BEFORE BEDTIME. 03/24/17   Blanchie Serve, MD  pantoprazole (PROTONIX) 40 MG tablet TAKE 1 TABLET BY MOUTH TWICE DAILY. Patient taking differently: Take 40mg  by mouth daily 02/28/17   Erlene Quan, PA-C  traZODone (DESYREL) 100 MG tablet Take 1 tablet (100 mg total) by mouth at bedtime. May take an additional 25mg  at bedtime as needed for sleep. Patient taking differently: Take 50 mg by mouth at bedtime. Patient takes one-half tablet by mouth each night at bedtime 03/03/17   Blanchie Serve, MD    Family History Family History  Problem Relation Age of Onset  . Coronary artery disease Mother   . Diabetes Mother   . Heart disease Mother   . Coronary artery disease Father   . Diabetes Father   . Lung cancer Father   . Heart disease Father   . Anuerysm Son   . Heart disease Brother     Social History Social History   Tobacco Use  . Smoking status: Never Smoker  . Smokeless tobacco: Never Used  Substance Use Topics  . Alcohol use: No    Alcohol/week: 0.0 oz  . Drug use: No     Allergies   Altace [ramipril]; Crestor [rosuvastatin calcium]; Penicillins; and Sulfa antibiotics   Review of Systems Review of Systems  Constitutional: Negative for activity change, appetite change and fever.  HENT: Negative for congestion, rhinorrhea and sore throat.   Respiratory: Negative for cough, chest tightness and shortness of breath.   Cardiovascular: Negative for chest pain.  Gastrointestinal: Negative for abdominal pain, nausea and vomiting.  Genitourinary: Positive for decreased urine volume, frequency, hematuria, penile pain, scrotal swelling, testicular pain and urgency. Negative  for dysuria.  Musculoskeletal: Negative for arthralgias, back pain and myalgias.  Skin: Negative for rash.  Neurological: Negative for dizziness, weakness and headaches.  all other systems are negative except as noted in the HPI and PMH.    Physical Exam Updated Vital Signs BP 126/69   Pulse 70   Temp 98.1 F (36.7 C) (Oral)   Resp 15   Ht 5\' 10"  (1.778 m)   Wt 75.3 kg (166 lb)   SpO2 95%   BMI 23.82 kg/m   Physical Exam  Constitutional: He is oriented to person, place, and time. He appears well-developed and well-nourished. No distress.  HENT:  Head: Normocephalic and atraumatic.  Mouth/Throat: Oropharynx is clear and moist. No oropharyngeal exudate.  Eyes: Conjunctivae and EOM are normal. Pupils are equal, round, and reactive to light.  Neck: Normal range of motion. Neck supple.  No meningismus.  Cardiovascular: Normal rate, regular rhythm, normal heart sounds and intact distal pulses.  No murmur heard. Pulmonary/Chest: Effort normal and breath sounds normal. No respiratory distress.  Abdominal: Soft. There is no tenderness. There is no rebound and no guarding.  Genitourinary:  Genitourinary Comments: Dark urine in foley bag. Some purulent discharge at meatus.  Bilateral testicular tenderness. No fluctuance or erythema  Musculoskeletal: Normal range of motion. He exhibits no edema or tenderness.  No CVAT  Neurological: He is alert and oriented to person, place, and time. No cranial nerve deficit. He exhibits normal muscle tone. Coordination normal.  No ataxia on finger to nose bilaterally. No pronator drift. 5/5 strength throughout. CN 2-12 intact.Equal grip strength. Sensation intact.   Skin: Skin is warm. Capillary refill takes less than 2 seconds. No rash noted.  Psychiatric: He has a normal mood and affect. His behavior is normal.  Nursing note and vitals reviewed.    ED Treatments / Results  Labs (all labs ordered are listed, but only abnormal results are  displayed) Labs Reviewed  URINALYSIS, ROUTINE W REFLEX MICROSCOPIC - Abnormal; Notable for the following components:      Result Value   Color, Urine AMBER (*)    APPearance CLOUDY (*)    Hgb urine dipstick SMALL (*)    Ketones, ur 5 (*)    Protein, ur 30 (*)    Leukocytes, UA LARGE (*)    Bacteria, UA RARE (*)    All other components within normal limits  URINE CULTURE  CBC WITH DIFFERENTIAL/PLATELET  BASIC METABOLIC PANEL    EKG  EKG Interpretation None       Radiology US Scrotum W/doppler  Result Date: 04/08/2017 CLINICAL DATA:  84 year old with testicular pain. EXAM: SCROTAL ULTRASOUND DOPPLER ULTRASOUND OF THE TESTICLES TECHNIQUE: Complete ultrasound examination of the testicles, epididymis, and other scrotal structures was performed. Color and spectral Doppler ultrasound were also utilized to evaluate blood flow to the testicles. COMPARISON:  None. FINDINGS: Right testicle Measurements: 3.8 x 2.3 x 2.3 cm. Homogeneous echogenicity with normal blood flow. No mass or microlithiasis visualized. Left testicle Measurements: 2.9 x 1.9 x 2.2 cm. Homogeneous echogenicity with normal blood flow. No mass or microlithiasis visualized. Right epididymis:  Normal in size and appearance. Left epididymis:  Normal in size and appearance. Hydrocele:  Large right and moderate left hydrocele. Varicocele:  Left measuring 4 mm. Pulsed Doppler interrogation of both testes demonstrates normal low resistance arterial and venous waveforms bilaterally. IMPRESSION: 1. Large right and moderate left hydrocele. 2. Normal sonographic appearance of the testicles with normal blood flow. 3. Left varicocele. Electronically Signed   By: Jeb Levering M.D.   On: 04/08/2017 01:17    Procedures Procedures (including critical care time)  Medications Ordered in ED  Medications  HYDROcodone-acetaminophen (NORCO/VICODIN) 5-325 MG per tablet 1 tablet (1 tablet Oral Given 04/08/17 0010)     Initial Impression /  Assessment and Plan / ED Course  I have reviewed the triage vital signs and the nursing notes.  Pertinent labs & imaging results that were available during my care of the patient were reviewed by me and considered in my medical decision making (see chart for details).    Penis pain and testicle pain with Foley catheter in place.  No trauma.  Vitals are stable. No abdominal pain or back pain.   We will check bladder scan and UA.  Bladder scan 0.  Urinalysis shows many red cells and many white cells.  Will send for culture.  Creatinine is normal.  Foley is draining well after irrigation.  Pain has improved with Norco given in the ED.  Will treat for UTI.  Ultrasound is negative for testicular torsion.  Patient denies any abdominal pain, back pain or flank pain. No evidence of pyelonephritis.  He will follow-up with his urologist.  Return precautions discussed.  Final Clinical Impressions(s) / ED Diagnoses   Final diagnoses:  Encounter for evaluation of Foley catheter  Penile pain    ED Discharge Orders    None       Chihiro Frey, Annie Main, MD 04/08/17 [phone]

## 2017-04-08 NOTE — Discharge Instructions (Signed)
Take the antibiotics as prescribed and follow-up with your urologist.  Return to the ED if you develop worsening pain, fever, vomiting or any other concerns.

## 2017-04-10 LAB — URINE CULTURE: Culture: >=100000 — AB

## 2017-04-11 ENCOUNTER — Telehealth: Payer: Self-pay | Admitting: *Deleted

## 2017-04-11 ENCOUNTER — Other Ambulatory Visit: Payer: Self-pay | Admitting: Internal Medicine

## 2017-04-11 NOTE — Telephone Encounter (Signed)
Post ED Visit - Positive Culture Follow-up  Culture report reviewed by antimicrobial stewardship pharmacist:  []  Elenor Quinones, Pharm.D. []  Heide Guile, Pharm.D., BCPS AQ-ID []  Parks Neptune, Pharm.D., BCPS []  Alycia Rossetti, Pharm.D., BCPS []  Lynn [place], Pharm.D., BCPS, AAHIVP []  Legrand Como, Pharm.D., BCPS, AAHIVP []  Salome Arnt, PharmD, BCPS []  Jalene Mullet, PharmD []  Vincenza Hews, PharmD, BCPS Leroy Libman, PharmD  Positive urine culture Treated with Cephalexin, organism sensitive to the same and no further patient follow-up is required at this time.  Harlon Flor [facility] 04/11/2017, 10:34 AM

## 2017-04-15 ENCOUNTER — Other Ambulatory Visit: Payer: Self-pay | Admitting: Internal Medicine

## 2017-04-21 ENCOUNTER — Non-Acute Institutional Stay: Payer: Medicare Other | Admitting: Internal Medicine

## 2017-04-21 ENCOUNTER — Encounter: Payer: Self-pay | Admitting: Internal Medicine

## 2017-04-21 ENCOUNTER — Telehealth: Payer: Self-pay

## 2017-04-21 VITALS — BP 124/66 | HR 73 | Temp 97.5°F | Resp 16 | Ht 70.0 in | Wt 171.0 lb

## 2017-04-21 DIAGNOSIS — G2581 Restless legs syndrome: Secondary | ICD-10-CM

## 2017-04-21 DIAGNOSIS — G903 Multi-system degeneration of the autonomic nervous system: Secondary | ICD-10-CM | POA: Insufficient documentation

## 2017-04-21 DIAGNOSIS — I5042 Chronic combined systolic (congestive) and diastolic (congestive) heart failure: Secondary | ICD-10-CM | POA: Diagnosis not present

## 2017-04-21 DIAGNOSIS — I2510 Atherosclerotic heart disease of native coronary artery without angina pectoris: Secondary | ICD-10-CM

## 2017-04-21 DIAGNOSIS — K581 Irritable bowel syndrome with constipation: Secondary | ICD-10-CM | POA: Diagnosis not present

## 2017-04-21 DIAGNOSIS — K2100 Gastro-esophageal reflux disease with esophagitis, without bleeding: Secondary | ICD-10-CM

## 2017-04-21 DIAGNOSIS — R339 Retention of urine, unspecified: Secondary | ICD-10-CM

## 2017-04-21 DIAGNOSIS — F5101 Primary insomnia: Secondary | ICD-10-CM

## 2017-04-21 DIAGNOSIS — E039 Hypothyroidism, unspecified: Secondary | ICD-10-CM | POA: Diagnosis not present

## 2017-04-21 NOTE — Telephone Encounter (Signed)
Spoke with the patient's daughter Caryl Asp and I mentioned that her dad has a lab appointment coming up on April 1st at 8:15 am. And that during his clinic visit it was some concerns on whether he could make it or not due to the home health aids that come in during the week. I expressed that Dr. Bubba Camp really needs this blood work to be completed. [name] then mentioned that she was unaware of her father having a lab appointment due to her mother not telling her or else she would have been here. [name] is aware of her father's lab and clinic appointment coming up, she stated that she would try her best to be there. She also mentioned that she would coordinate with the home health agency to make sure that he makes it to his lab appointment on time.

## 2017-04-21 NOTE — Patient Instructions (Signed)
  Stop taking claritin  Start taking torsemide every other day for now until I see you next. Weigh yourself every day and bring a copy of the weight on your next visit.  I need you to get lab work on 05/05/17 to review on 05/07/17 visit.

## 2017-04-21 NOTE — Progress Notes (Signed)
[facility]  Provider: Blanchie Serve MD   Location:  [place] of Service:  Clinic (12)  PCP: Blanchie Serve, MD Patient Care Team: Blanchie Serve, MD as PCP - General (Internal Medicine) Croitoru, Dani Gobble, MD as Attending Physician (Cardiology) Vevelyn Royals, MD as Consulting Physician (Ophthalmology) Franchot Gallo, MD as Consulting Physician (Urology) Tanda Rockers, MD as Consulting Physician (Pulmonary Disease) Allyn Kenner, MD (Dermatology) Deliah Goody, PA-C as Physician Assistant (Physician Assistant) Ngetich, Nelda Bucks, NP as Nurse Practitioner (Family Medicine)  Extended Emergency Contact Information Primary Emergency Contact: Viverette,[name] Address: [address]          [place] Phone: [phone] Mobile Phone: [phone] Relation: Spouse Secondary Emergency Contact: [name],Dann  [place] Mobile Phone: [phone] Relation: Daughter   Goals of Care: Advanced Directive information Advanced Directives 04/07/2017  Does Patient Have a Medical Advance Directive? Yes  Type of Advance Directive Living will  Does patient want to make changes to medical advance directive? No - Patient declined  Copy of [place] in Chart? -  Would patient like information on creating a medical advance directive? -  Pre-existing out of facility DNR order (yellow form or pink MOST form) -      Chief Complaint  Patient presents with  . Medical Management of Chronic Issues    3 month follow up  . Medication Refill    No refills needed at this time    HPI: Patient is a 84 y.o. male seen today for routine visit. He is here with his wife.   Restless legs- it is interrupting his sleep at night, currently on requip three times a day as needed. He is taking 1 at bedtime  IBS- has alternating loose and solid stool, denies blood and mucus in stool. Occasional growling and  gas present. No nausea or vomiting. Currently on miralax bid and senokot s only as needed.  Insomnia- currently on trazodone 50 mg daily. , helps with sleep  Allergic rhinitis- currently on cetirizine 10 mg daily as needed, denies symptoms this visit.   Orthostatic hypotension- neurogenic, currently on midodrine 10 mg tid, denies fall, occasional dizziness present.  CAD- currently on aspirin and plavix. Denies bleeding. Also on pravastatin 40 mg daily.    gerd- takes pantoprazole 40 mg daily, denies reflux symptom  Hypothyroidism- currently on levothyroxine 137 mcg daily  CHF- has been taking torsemide maybe once a week or twice, patient and wife are not sure. On chart review has gained 5 lbs since last visit.   Urinary retention- has indwelling foley catheter in place  Past Medical History:  Diagnosis Date  . Arthritis    "minor, back and sometimes knees" (12/15/2012)  . Bradycardia    AFib/SSS s/p St Jude PPM 04/12/2008  . CAD (coronary artery disease) 12/30/2014   CABG (LIMA-LAD, SVG-RCA, SVG-OM in 1996).  07/2009 BMS to SVG-RCA. Cath in 04/2010 with patent stents   . Cardiomyopathy, ischemic 08/25/2012  . CHF (congestive heart failure) ([place])   . Chronic knee pain 12/03/2014  . Combined congestive systolic and diastolic heart failure ([place]) 02/02/2015   Hx EF 41%. BNP 96.8 02/21/15 Torsemide 04/06/15 Na 142, K 4.6, Bun 16, creat 0.89 04/20/14 BNP 111.7, Na 142, K 4.6, Bun 16, creat 0.9   . Depression with anxiety 02/02/2015   02/21/15 Hgb A1c 5.8 03/10/15 MMSE 30/30   . Dizziness, after diuretic asscoiated with hypotension  and responded to fluid bolus 06/05/2011   04/28/15 US carotid R+L normal bilateral arterial velocities.    Marland Kitchen Dyspnea 08/11/2014   Followed in Pulmonary clinic/ [facility]/ [facility]  - 08/11/2014  Walked RA x 1 laps @ 185 ft each stopped due to fatigue/off balance/ slow pace/  no sob or desat  - PFT's  09/26/2014  FEV1 2.26 (85 % ) ratio 76  p no % improvement from saba with  DLCO  67 % corrects to 93 % for alv volume      Since prev study 08/04/13 minimal change lung vol or dlco    . Embolic cerebral infarction ([place]) 12/06/2015  . Exertional shortness of breath    "sometimes walking" (12/15/2012)  . GERD (gastroesophageal reflux disease)   . Gout 02/09/2015  . Heart murmur    "just told I had one today" (12/15/2012)  . Hiatal hernia   . Hyperlipidemia   . Hypertension   . Hypothyroid   . Influenza A 03/10/2016  . Insomnia   . Melanoma of back ([place]) 1976  . Myocardial infarction [facility]) 1996; 2011   "both silent" (12/15/2012)  . Nonrheumatic aortic valve stenosis   . Orthostatic hypotension   . Osteoporosis, senile   . Pacemaker   . RBBB   . Restless leg 02/02/2015  . Right leg weakness 12/06/2015  . [facility] spotted fever   . S/P CABG x 4   . Sick sinus syndrome ([place]) 01/31/2014  . Sustained ventricular tachycardia ([place]) 07/27/2014  . Urinary retention 12/30/2014   Past Surgical History:  Procedure Laterality Date  . CARDIAC CATHETERIZATION  04/2010   LIMA to LAD patent,SVG to OM patent,no in-stnet restenosis RCA  . CATARACT EXTRACTION W/ INTRAOCULAR LENS  IMPLANT, BILATERAL Bilateral 2012  . CORONARY ANGIOPLASTY WITH STENT PLACEMENT  07/2009   bare metal stent to SVG to the RCA  . CORONARY ARTERY BYPASS GRAFT  1996   LIMA to LAD,SVG to RCA & SVG to OM  . INSERT / REPLACE / REMOVE PACEMAKER  2010  . MELANOMA EXCISION  05/1974 X2   "taken off my back" (12/15/2012)  . NM MYOVIEW LTD  06/2011   low risk  . PPM GENERATOR CHANGEOUT N/A 01/22/2017   Procedure: PPM GENERATOR CHANGEOUT;  Surgeon: [name], MD;  Location: [facility];  Service: Cardiovascular;  Laterality: N/A;  . TONSILLECTOMY  1938  . TRANSURETHRAL RESECTION OF PROSTATE  1986  . US ECHOCARDIOGRAPHY  07/11/2009   EF 45-50%    reports that  has never smoked. he has never used smokeless tobacco. He reports that he does not drink alcohol or use drugs. Social History    Socioeconomic History  . Marital status: Married    Spouse name: Not on file  . Number of children: 2  . Years of education: Masters  . Highest education level: Not on file  Social Needs  . Financial resource strain: Not on file  . Food insecurity - worry: Not on file  . Food insecurity - inability: Not on file  . Transportation needs - medical: Not on file  . Transportation needs - non-medical: Not on file  Occupational History  . Occupation: Retired Company secretary -Pensions consultant  Tobacco Use  . Smoking status: Never Smoker  . Smokeless tobacco: Never Used  Substance and Sexual Activity  . Alcohol use: No    Alcohol/week: 0.0 oz  . Drug use: No  . Sexual activity: No  Other Topics Concern  . Not  on file  Social History Narrative   Lives at [place] 01/09/15   Married - Violet   Never smoked   Alcohol none   Previously employed as Scientist, forensic for [employer].         Diet:Low sodium   Do you drink/eat things with caffeine? No   Marital status: Married                              What year were you married?1950   Do you live in a house, apartment, assisted living, condo, trailer, etc)?    Is it one or more stories? 1   How many persons live in your home? 2   Do you have any pets in your home? No   Current or past profession: Minister, [employer]   Do you exercise?     Very Little                                                 Type & how often:    Do you have a living will?  Yes   Do you have a DNR Form? Yes   Do you have a POA/HPOA forms? Yes    Functional Status Survey:    Family History  Problem Relation Age of Onset  . Coronary artery disease Mother   . Diabetes Mother   . Heart disease Mother   . Coronary artery disease Father   . Diabetes Father   . Lung cancer Father   . Heart disease Father   . Anuerysm Son   . Heart disease Brother     Health Maintenance  Topic Date Due  . PNA vac Low Risk Adult (2  of 2 - PCV13) 10/06/2010  . TETANUS/TDAP  08/01/2026 (Originally 08/01/2024)  . DEXA SCAN  12/14/2024  . INFLUENZA VACCINE  Completed    Allergies  Allergen Reactions  . Altace [Ramipril] Cough  . Crestor [Rosuvastatin Calcium] Rash  . Penicillins Rash and Other (See Comments)    Has patient had a PCN reaction causing immediate rash, facial/tongue/throat swelling, SOB or lightheadedness with hypotension: Yes Has patient had a PCN reaction causing severe rash involving mucus membranes or skin necrosis: No Has patient had a PCN reaction that required hospitalization No Has patient had a PCN reaction occurring within the last 10 years: No If all of the above answers are "NO", then may proceed with Cephalosporin use.   . Sulfa Antibiotics Rash    Outpatient Encounter Medications as of 04/21/2017  Medication Sig  . acetaminophen (TYLENOL) 325 MG tablet Take 650 mg by mouth as needed for moderate pain or headache.   Marland Kitchen amiodarone (PACERONE) 100 MG tablet Take 1 tablet (100 mg total) by mouth daily.  Marland Kitchen aspirin EC 81 MG tablet Take 1 tablet (81 mg total) by mouth daily.  . Cholecalciferol (VITAMIN D3) 5000 UNITS CAPS Take 5,000 Units by mouth every Monday.   . Clobetasol Propionate 0.05 % lotion Apply 1 application topically 2 (two) times daily.  . clopidogrel (PLAVIX) 75 MG tablet TAKE 1 TABLET ONCE DAILY.  Marland Kitchen guaiFENesin (MUCINEX) 600 MG 12 hr tablet Take 600 mg by mouth daily as needed for cough.   . levothyroxine (SYNTHROID, LEVOTHROID) 137 MCG tablet Take 1 tablet (137 mcg  total) by mouth daily before breakfast.  . midodrine (PROAMATINE) 10 MG tablet TAKE 1 TABLET THREE TIMES DAILY. TAKE LAST DOSE AT LEAST 6 HOURS BEFORE BEDTIME.  . pantoprazole (PROTONIX) 40 MG tablet TAKE 1 TABLET BY MOUTH TWICE DAILY.  Marland Kitchen polyethylene glycol (MIRALAX / GLYCOLAX) packet Take 17 g by mouth 2 (two) times daily. Hold for loose stool  . pravastatin (PRAVACHOL) 40 MG tablet Take 1 tablet (40 mg total) by mouth  at bedtime.  Marland Kitchen rOPINIRole (REQUIP) 0.5 MG tablet TAKE 1 TABLET 3 TIMES A DAY AS NEEDED.  Marland Kitchen sennosides-docusate sodium (SENOKOT-S) 8.6-50 MG tablet Take 2 tablets by mouth as needed for constipation.   . torsemide (DEMADEX) 10 MG tablet Take 10 mg by mouth every other day.  . traZODone (DESYREL) 100 MG tablet Take 1 tablet (100 mg total) by mouth at bedtime. May take an additional 54m at bedtime as needed for sleep.  . white petrolatum (VASELINE) GEL Apply 1 application topically as needed (rectal irritation).  . [DISCONTINUED] cetirizine (ZYRTEC) 10 MG tablet Take 10 mg by mouth daily as needed for allergies.   . [DISCONTINUED] traZODone (DESYREL) 50 MG tablet Take 50 mg by mouth at bedtime. May take an additional 25 mg as needed for sleep  . [DISCONTINUED] cephALEXin (KEFLEX) 500 MG capsule Take 1 capsule (500 mg total) by mouth 2 (two) times daily.   No facility-administered encounter medications on file as of 04/21/2017.     Review of Systems  Constitutional: Positive for fatigue. Negative for appetite change, chills and fever.  HENT: Positive for hearing loss. Negative for congestion, ear pain, rhinorrhea, sore throat and trouble swallowing.   Respiratory: Positive for cough. Negative for shortness of breath and wheezing.   Cardiovascular: Negative for chest pain, palpitations and leg swelling.  Gastrointestinal: Positive for constipation. Negative for abdominal pain, blood in stool, nausea and vomiting.  Genitourinary: Negative for dysuria and hematuria.       Has foley catheter  Musculoskeletal: Positive for gait problem.  Skin: Negative for wound.  Neurological: Positive for dizziness. Negative for syncope and headaches.  Psychiatric/Behavioral: Positive for confusion.    Vitals:   04/21/17 1500  BP: 124/66  Pulse: 73  Resp: 16  Temp: (!) 97.5 F (36.4 C)  TempSrc: Oral  SpO2: 97%  Weight: 171 lb (77.6 kg)  Height: 5' 10" (1.778 m)   Body mass index is 24.54 kg/m.    Wt Readings from Last 3 Encounters:  04/21/17 171 lb (77.6 kg)  04/07/17 166 lb (75.3 kg)  03/18/17 167 lb (75.8 kg)   Physical Exam  Constitutional: He appears well-developed and well-nourished. No distress.  HENT:  Head: Normocephalic and atraumatic.  Nose: Nose normal.  Mouth/Throat: Oropharynx is clear and moist. No oropharyngeal exudate.  Eyes: Conjunctivae and EOM are normal. Pupils are equal, round, and reactive to light. Right eye exhibits no discharge. Left eye exhibits no discharge.  Neck: Normal range of motion. Neck supple. No thyromegaly present.  Cardiovascular: Normal rate and regular rhythm.  Murmur heard. Pulmonary/Chest: Effort normal and breath sounds normal. He has no wheezes. He has no rales. He exhibits no tenderness.  Abdominal: Soft. Bowel sounds are normal. There is no tenderness.  Musculoskeletal: He exhibits edema.  1+ pitting edema  Neurological: He is alert.  Oriented to person and place  Skin: Skin is warm and dry. No rash noted. He is not diaphoretic.    Labs reviewed: Basic Metabolic Panel: Recent Labs    01/01/17 0000  01/16/17 1436 03/18/17 2113 04/07/17 2358  NA 143 142 138 138  K 3.9 3.8 4.3 4.1  CL 103 103 104 104  CO2 32 27  --  26  GLUCOSE 82 105* 105* 97  BUN _0 CREATININE 0.76 0.91 0.70 0.93  CALCIUM 8.9 8.8  --  9.0   Liver Function Tests: Recent Labs    05/08/16 08/15/16 01/01/17 0000  AST _1 ALT _2 ALKPHOS 78 62  --   BILITOT  --   --  0.4  PROT  --   --  6.3   No results for input(s): LIPASE, AMYLASE in the last 8760 hours. No results for input(s): AMMONIA in the last 8760 hours. CBC: Recent Labs    07/31/16 1359  09/17/16 1517  01/16/17 1436 03/18/17 2026 03/18/17 2113 04/07/17 2358  WBC CANCELED   < > 6.1   < > 6.4 5.9  --  8.6  NEUTROABS CANCELED  --  4.0  --   --   --   --  6.2  HGB CANCELED   < > 13.4   < > 13.6 13.4 13.9 13.7  HCT CANCELED   < > 40.5   < > 40.3 40.4 41.0 41.1   MCV CANCELED   < > 98.6   < > 95 97.3  --  97.4  PLT CANCELED   < > 228.0   < > 261 224  --  230   < > = values in this interval not displayed.   Cardiac Enzymes: No results for input(s): CKTOTAL, CKMB, CKMBINDEX, TROPONINI in the last 8760 hours. BNP: Invalid input(s): POCBNP Lab Results  Component Value Date   HGBA1C 5.6 12/07/2015   Lab Results  Component Value Date   TSH 1.53 09/17/2016   No results found for: VITAMINB12 No results found for: FOLATE Lab Results  Component Value Date   FERRITIN 88 10/11/2016    Lipid Panel: No results for input(s): CHOL, HDL, LDLCALC, TRIG, CHOLHDL, LDLDIRECT in the last 8760 hours. Lab Results  Component Value Date   HGBA1C 5.6 12/07/2015    Procedures since last visit: US Scrotum W/doppler  Result Date: 04/08/2017 CLINICAL DATA:  84 year old with testicular pain. EXAM: SCROTAL ULTRASOUND DOPPLER ULTRASOUND OF THE TESTICLES TECHNIQUE: Complete ultrasound examination of the testicles, epididymis, and other scrotal structures was performed. Color and spectral Doppler ultrasound were also utilized to evaluate blood flow to the testicles. COMPARISON:  None. FINDINGS: Right testicle Measurements: 3.8 x 2.3 x 2.3 cm. Homogeneous echogenicity with normal blood flow. No mass or microlithiasis visualized. Left testicle Measurements: 2.9 x 1.9 x 2.2 cm. Homogeneous echogenicity with normal blood flow. No mass or microlithiasis visualized. Right epididymis:  Normal in size and appearance. Left epididymis:  Normal in size and appearance. Hydrocele:  Large right and moderate left hydrocele. Varicocele:  Left measuring 4 mm. Pulsed Doppler interrogation of both testes demonstrates normal low resistance arterial and venous waveforms bilaterally. IMPRESSION: 1. Large right and moderate left hydrocele. 2. Normal sonographic appearance of the testicles with normal blood flow. 3. Left varicocele. Electronically Signed   By: Jeb Levering M.D.   On: 04/08/2017  01:17    Assessment/Plan  1. Neurogenic orthostatic hypotension (HCC) Continue midodrine and monitor - CMP with eGFR([facility]); Future  2. Chronic combined systolic and diastolic congestive heart failure (HCC) Change torsemide to every other day for now, check daily weight. Reassess in 2 weeks.  - CMP with  eGFR([facility]); Future  3. Coronary artery disease involving native coronary artery of native heart without angina pectoris Continue aspirin and plavix with statin - CMP with eGFR([facility]); Future - Lipid Panel; Future  4. Gastroesophageal reflux disease with esophagitis Continue pantoprazole  5. Urinary retention Continue foley care.   6. RLS (restless legs syndrome) Continue requip and can take upto 3 times a day.   7. Primary insomnia Continue trazodone  8. Hypothyroidism, unspecified type Continue levothyroxine 137 mcg daily - TSH; Future  9. Irritable bowel syndrome with constipation Continue miralax bid and senokot s only as needed. Hydration encouraged   Labs/tests ordered:   Lab Orders     CMP with eGFR([facility])     Lipid Panel     TSH  Next appointment: 2 weeks to follow up on CHF, weight gain  Communication: reviewed care plan with patient and his wife.     Blanchie Serve, MD Internal Medicine [facility] [address] [place], [place] [address] Cell Phone (Monday-Friday 8 am - 5 pm): [phone] On Call: [phone] and follow prompts after 5 pm and on weekends Office Phone: [phone] Office Fax: [phone]

## 2017-04-24 ENCOUNTER — Telehealth: Payer: Self-pay | Admitting: Pharmacist

## 2017-04-24 NOTE — Telephone Encounter (Signed)
LMOM;   Prior authorization for Adam [name] was approved by insurance on Oct/17/2018 (see document on media). Unable to find information related to patient trial for [name].   Was patient on [name] last year? Any problems to afford medication?

## 2017-04-25 ENCOUNTER — Non-Acute Institutional Stay: Payer: Medicare Other | Admitting: Internal Medicine

## 2017-04-25 ENCOUNTER — Encounter: Payer: Self-pay | Admitting: Internal Medicine

## 2017-04-25 VITALS — BP 128/70 | HR 70 | Temp 97.5°F | Resp 16 | Ht 70.0 in | Wt 176.0 lb

## 2017-04-25 DIAGNOSIS — B029 Zoster without complications: Secondary | ICD-10-CM

## 2017-04-25 MED ORDER — VALACYCLOVIR HCL 1 G PO TABS: 1000.0000 mg | ORAL_TABLET | Freq: Three times a day (TID) | ORAL | 0 refills | Status: DC

## 2017-04-25 NOTE — Progress Notes (Signed)
Adam [name]  Provider: Blanchie Serve MD   Location:  [place] of Service:  [name] (12)  PCP: Blanchie Serve, MD Patient Care Team: Blanchie Serve, MD as PCP - General (Internal Medicine) Croitoru, Dani Gobble, MD as Attending Physician (Cardiology) Vevelyn Royals, MD as Consulting Physician (Ophthalmology) Franchot Gallo, MD as Consulting Physician (Urology) Tanda Rockers, MD as Consulting Physician (Pulmonary Disease) Allyn Kenner, MD (Dermatology) Deliah Goody, PA-C as Physician Assistant (Physician Assistant) Ngetich, Nelda Bucks, NP as Nurse Practitioner (Family Medicine)  Extended Emergency Contact Information Primary Emergency Contact: Devonshire,[name] Address: [address]          [place] Phone: [phone] Mobile Phone: [phone] Relation: Spouse Secondary Emergency Contact: [name],Garrelts  [place] Mobile Phone: [phone] Relation: Daughter   Goals of Care: Advanced Directive information Advanced Directives 04/07/2017  Does Patient Have a Medical Advance Directive? Yes  Type of Advance Directive Living will  Does patient want to make changes to medical advance directive? No - Patient declined  Copy of [place] in Chart? -  Would patient like information on creating a medical advance directive? -  Pre-existing out of facility DNR order (yellow form or pink MOST form) -      Chief Complaint  Patient presents with  . Acute Visit    rash to lower back with itching    HPI: Patient is a 84 y.o. male seen today for acute visit. Wife noticed raised lesion to right lower back Wednesday evening with patient complaining of itching. He has a caregiver from health care agency that comes to help him get dressed and cleaned. She noticed raised bups and advised patient and his wife to have patient be seen in urgent care that evening. Wife was not concerned about  it and applied hydrocortisone cream to help with itching. Here today for evaluation. Denies pain to area. Denies numbness or tingling.   Past Medical History:  Diagnosis Date  . Arthritis    "minor, back and sometimes knees" (12/15/2012)  . Bradycardia    AFib/SSS s/p St Jude PPM 04/12/2008  . CAD (coronary artery disease) 12/30/2014   CABG (LIMA-LAD, SVG-RCA, SVG-OM in 1996).  07/2009 BMS to SVG-RCA. Cath in 04/2010 with patent stents   . Cardiomyopathy, ischemic 08/25/2012  . CHF (congestive heart failure) ([place])   . Chronic knee pain 12/03/2014  . Combined congestive systolic and diastolic heart failure ([place]) 02/02/2015   Hx EF 41%. BNP 96.8 02/21/15 Torsemide 04/06/15 Na 142, K 4.6, Bun 16, creat 0.89 04/20/14 BNP 111.7, Na 142, K 4.6, Bun 16, creat 0.9   . Depression with anxiety 02/02/2015   02/21/15 Hgb A1c 5.8 03/10/15 MMSE 30/30   . Dizziness, after diuretic asscoiated with hypotension and responded to fluid bolus 06/05/2011   04/28/15 US carotid R+L normal bilateral arterial velocities.    Marland Kitchen Dyspnea 08/11/2014   Followed in [name] [place]  - 08/11/2014  Walked RA x 1 laps @ 185 ft each stopped due to fatigue/off balance/ slow pace/  no sob or desat  - PFT's  09/26/2014  FEV1 2.26 (85 % ) ratio 76  p no % improvement from saba with DLCO  67 % corrects to 93 % for alv volume      Since prev study 08/04/13 minimal change lung vol or dlco    . Embolic cerebral infarction ([place]) 12/06/2015  . Exertional  shortness of breath    "sometimes walking" (12/15/2012)  . GERD (gastroesophageal reflux disease)   . Gout 02/09/2015  . Heart murmur    "just told I had one today" (12/15/2012)  . Hiatal hernia   . Hyperlipidemia   . Hypertension   . Hypothyroid   . Influenza A 03/10/2016  . Insomnia   . Melanoma of back ([place]) 1976  . Myocardial infarction [facility]) 1996; 2011   "both silent" (12/15/2012)  . Nonrheumatic aortic valve stenosis   . Orthostatic hypotension   . Osteoporosis,  senile   . Pacemaker   . RBBB   . Restless leg 02/02/2015  . Right leg weakness 12/06/2015  . [facility] spotted fever   . S/P CABG x 4   . Sick sinus syndrome ([place]) 01/31/2014  . Sustained ventricular tachycardia ([place]) 07/27/2014  . Urinary retention 12/30/2014   Past Surgical History:  Procedure Laterality Date  . CARDIAC CATHETERIZATION  04/2010   LIMA to LAD patent,SVG to OM patent,no in-stnet restenosis RCA  . CATARACT EXTRACTION W/ INTRAOCULAR LENS  IMPLANT, BILATERAL Bilateral 2012  . CORONARY ANGIOPLASTY WITH STENT PLACEMENT  07/2009   bare metal stent to SVG to the RCA  . CORONARY ARTERY BYPASS GRAFT  1996   LIMA to LAD,SVG to RCA & SVG to OM  . INSERT / REPLACE / REMOVE PACEMAKER  2010  . MELANOMA EXCISION  05/1974 X2   "taken off my back" (12/15/2012)  . NM MYOVIEW LTD  06/2011   low risk  . PPM GENERATOR CHANGEOUT N/A 01/22/2017   Procedure: PPM GENERATOR CHANGEOUT;  Surgeon: [name], MD;  Location: [facility];  Service: Cardiovascular;  Laterality: N/A;  . TONSILLECTOMY  1938  . TRANSURETHRAL RESECTION OF PROSTATE  1986  . US ECHOCARDIOGRAPHY  07/11/2009   EF 45-50%    reports that he has never smoked. He has never used smokeless tobacco. He reports that he does not drink alcohol or use drugs. Social History   Socioeconomic History  . Marital status: Married    Spouse name: Not on file  . Number of children: 2  . Years of education: Masters  . Highest education level: Not on file  Occupational History  . Occupation: Retired Company secretary -Pensions consultant  Social Needs  . Financial resource strain: Not on file  . Food insecurity:    Worry: Not on file    Inability: Not on file  . Transportation needs:    Medical: Not on file    Non-medical: Not on file  Tobacco Use  . Smoking status: Never Smoker  . Smokeless tobacco: Never Used  Substance and Sexual Activity  . Alcohol use: No    Alcohol/week: 0.0 oz  . Drug use: No  . Sexual  activity: Never  Lifestyle  . Physical activity:    Days per week: Not on file    Minutes per session: Not on file  . Stress: Not on file  Relationships  . Social connections:    Talks on phone: Not on file    Gets together: Not on file    Attends religious service: Not on file    Active member of club or organization: Not on file    Attends meetings of clubs or organizations: Not on file    Relationship status: Not on file  . Intimate partner violence:    Fear of current or ex partner: Not on file    Emotionally abused: Not on file  Physically abused: Not on file    Forced sexual activity: Not on file  Other Topics Concern  . Not on file  Social History Narrative   Lives at [place] to [place] 01/09/15   Married - Violet   Never smoked   Alcohol none   Previously employed as Scientist, forensic for [employer].         Diet:Low sodium   Do you drink/eat things with caffeine? No   Marital status: Married                              What year were you married?1950   Do you live in a house, apartment, assisted living, condo, trailer, etc)?    Is it one or more stories? 1   How many persons live in your home? 2   Do you have any pets in your home? No   Current or past profession: Minister, [employer]ore[employer]   Do you exercise?     Very Little                                                 Type & how often:    Do you have a living will?  Yes   Do you have a DNR Form? Yes   Do you have a POA/HPOA forms? Yes     Family History  Problem Relation Age of Onset  . Coronary artery disease Mother   . Diabetes Mother   . Heart disease Mother   . Coronary artery disease Father   . Diabetes Father   . Lung cancer Father   . Heart disease Father   . Anuerysm Son   . Heart disease Brother     Health Maintenance  Topic Date Due  . PNA vac Low Risk Adult (2 of 2 - PCV13) 10/06/2010  . TETANUS/TDAP  08/01/2026 (Originally 08/01/2024)  . DEXA SCAN  12/14/2024    . INFLUENZA VACCINE  Completed    Allergies  Allergen Reactions  . Altace [Ramipril] Cough  . Crestor [Rosuvastatin Calcium] Rash  . Penicillins Rash and Other (See Comments)    Has patient had a PCN reaction causing immediate rash, facial/tongue/throat swelling, SOB or lightheadedness with hypotension: Yes Has patient had a PCN reaction causing severe rash involving mucus membranes or skin necrosis: No Has patient had a PCN reaction that required hospitalization No Has patient had a PCN reaction occurring within the last 10 years: No If all of the above answers are "NO", then may proceed with Cephalosporin use.   . Sulfa Antibiotics Rash    Outpatient Encounter Medications as of 04/25/2017  Medication Sig  . acetaminophen (TYLENOL) 325 MG tablet Take 650 mg by mouth as needed for moderate pain or headache.   Marland Kitchen amiodarone (PACERONE) 100 MG tablet Take 1 tablet (100 mg total) by mouth daily.  Marland Kitchen aspirin EC 81 MG tablet Take 1 tablet (81 mg total) by mouth daily.  . Cholecalciferol (VITAMIN D3) 5000 UNITS CAPS Take 5,000 Units by mouth every Monday.   . Clobetasol Propionate 0.05 % lotion Apply 1 application topically 2 (two) times daily.  . clopidogrel (PLAVIX) 75 MG tablet TAKE 1 TABLET ONCE DAILY.  Marland Kitchen guaiFENesin (MUCINEX) 600 MG 12 hr tablet Take 600 mg by mouth daily as  needed for cough.   . levothyroxine (SYNTHROID, LEVOTHROID) 137 MCG tablet Take 1 tablet (137 mcg total) by mouth daily before breakfast.  . midodrine (PROAMATINE) 10 MG tablet TAKE 1 TABLET THREE TIMES DAILY. TAKE LAST DOSE AT LEAST 6 HOURS BEFORE BEDTIME.  . pantoprazole (PROTONIX) 40 MG tablet TAKE 1 TABLET BY MOUTH TWICE DAILY.  Marland Kitchen polyethylene glycol (MIRALAX / GLYCOLAX) packet Take 17 g by mouth 2 (two) times daily. Hold for loose stool  . pravastatin (PRAVACHOL) 40 MG tablet Take 1 tablet (40 mg total) by mouth at bedtime.  Marland Kitchen rOPINIRole (REQUIP) 0.5 MG tablet TAKE 1 TABLET 3 TIMES A DAY AS NEEDED.  Marland Kitchen  sennosides-docusate sodium (SENOKOT-S) 8.6-50 MG tablet Take 2 tablets by mouth as needed for constipation.   . torsemide (DEMADEX) 10 MG tablet Take 10 mg by mouth every other day.  . traZODone (DESYREL) 100 MG tablet Take 1 tablet (100 mg total) by mouth at bedtime. May take an additional 25mg  at bedtime as needed for sleep.  . white petrolatum (VASELINE) GEL Apply 1 application topically as needed (rectal irritation).  . valACYclovir (VALTREX) 1000 MG tablet Take 1 tablet (1,000 mg total) by mouth 3 (three) times daily.   No facility-administered encounter medications on file as of 04/25/2017.     Review of Systems  Constitutional: Negative for appetite change, chills and fever.  HENT: Negative for congestion and mouth sores.   Eyes: Negative for pain, discharge, itching and visual disturbance.  Respiratory: Negative for cough and shortness of breath.   Cardiovascular: Negative for chest pain.  Gastrointestinal: Negative for abdominal pain, nausea and vomiting.  Musculoskeletal: Positive for gait problem. Negative for back pain.  Neurological: Negative for dizziness and headaches.  Psychiatric/Behavioral: Positive for confusion.    Vitals:   04/25/17 0930  BP: 128/70  Pulse: 70  Resp: 16  Temp: (!) 97.5 F (36.4 C)  TempSrc: Oral  SpO2: 99%  Weight: 176 lb (79.8 kg)  Height: 5\' 10"  (1.778 m)   Body mass index is 25.25 kg/m. Physical Exam  Constitutional: He appears well-developed and well-nourished. No distress.  HENT:  Head: Normocephalic and atraumatic.  Mouth/Throat: Oropharynx is clear and moist.  Eyes: Conjunctivae and EOM are normal.  Neck: Neck supple.  Cardiovascular: Normal rate and regular rhythm.  Pulmonary/Chest: Effort normal and breath sounds normal.  Abdominal: Soft. Bowel sounds are normal.  Skin: Skin is warm and dry. Rash noted. He is not diaphoretic. There is erythema.  Right lower back has pin point size erythematous papules in cluster and a larger  papule with crusting and scratch mark with denudation of skin area in thoracic distribution, non tender, no vesicles or pustules noted   Psychiatric: He has a normal mood and affect.    Labs reviewed: Basic Metabolic Panel: Recent Labs    01/01/17 0000 01/16/17 1436 03/18/17 2113 04/07/17 2358  NA 143 142 138 138  K 3.9 3.8 4.3 4.1  CL 103 103 104 104  CO2 32 27  --  26  GLUCOSE 82 105* 105* 97  BUN 14 13 10 15   CREATININE 0.76 0.91 0.70 0.93  CALCIUM 8.9 8.8  --  9.0   Liver Function Tests: Recent Labs    05/08/16 08/15/16 01/01/17 0000  AST 20 22 29   ALT 19 21 25   ALKPHOS 78 62  --   BILITOT  --   --  0.4  PROT  --   --  6.3   No results for input(s):  LIPASE, AMYLASE in the last 8760 hours. No results for input(s): AMMONIA in the last 8760 hours. CBC: Recent Labs    07/31/16 1359  09/17/16 1517  01/16/17 1436 03/18/17 2026 03/18/17 2113 04/07/17 2358  WBC CANCELED   < > 6.1   < > 6.4 5.9  --  8.6  NEUTROABS CANCELED  --  4.0  --   --   --   --  6.2  HGB CANCELED   < > 13.4   < > 13.6 13.4 13.9 13.7  HCT CANCELED   < > 40.5   < > 40.3 40.4 41.0 41.1  MCV CANCELED   < > 98.6   < > 95 97.3  --  97.4  PLT CANCELED   < > 228.0   < > 261 224  --  230   < > = values in this interval not displayed.   Cardiac Enzymes: No results for input(s): CKTOTAL, CKMB, CKMBINDEX, TROPONINI in the last 8760 hours. BNP: Invalid input(s): POCBNP Lab Results  Component Value Date   HGBA1C 5.6 12/07/2015   Lab Results  Component Value Date   TSH 1.53 09/17/2016   No results found for: VITAMINB12 No results found for: FOLATE Lab Results  Component Value Date   FERRITIN 88 10/11/2016    Lipid Panel: No results for input(s): CHOL, HDL, LDLCALC, TRIG, CHOLHDL, LDLDIRECT in the last 8760 hours. Lab Results  Component Value Date   HGBA1C 5.6 12/07/2015    Procedures since last visit: US Scrotum W/doppler  Result Date: 04/08/2017 CLINICAL DATA:  84 year old with  testicular pain. EXAM: SCROTAL ULTRASOUND DOPPLER ULTRASOUND OF THE TESTICLES TECHNIQUE: Complete ultrasound examination of the testicles, epididymis, and other scrotal structures was performed. Color and spectral Doppler ultrasound were also utilized to evaluate blood flow to the testicles. COMPARISON:  None. FINDINGS: Right testicle Measurements: 3.8 x 2.3 x 2.3 cm. Homogeneous echogenicity with normal blood flow. No mass or microlithiasis visualized. Left testicle Measurements: 2.9 x 1.9 x 2.2 cm. Homogeneous echogenicity with normal blood flow. No mass or microlithiasis visualized. Right epididymis:  Normal in size and appearance. Left epididymis:  Normal in size and appearance. Hydrocele:  Large right and moderate left hydrocele. Varicocele:  Left measuring 4 mm. Pulsed Doppler interrogation of both testes demonstrates normal low resistance arterial and venous waveforms bilaterally. IMPRESSION: 1. Large right and moderate left hydrocele. 2. Normal sonographic appearance of the testicles with normal blood flow. 3. Left varicocele. Electronically Signed   By: Jeb Levering M.D.   On: 04/08/2017 01:17    Assessment/Plan  1. Herpes zoster without complication Has lesion to his back suggestive of shingles. Onset 3/201/9 per wife. Start valcyclovir 1 g three times a day for 1 week. Monitor for neuropathic pain. Monitor lesions. - valACYclovir (VALTREX) 1000 MG tablet; Take 1 tablet (1,000 mg total) by mouth 3 (three) times daily.  Dispense: 21 tablet; Refill: 0   Labs/tests ordered:  None  Next appointment: has follow up  Communication: reviewed care plan with patient and his wife.     Blanchie Serve, MD Internal Medicine [facility] [address] [place], [place] [address] Cell Phone (Monday-Friday 8 am - 5 pm): [phone] On Call: [phone] and follow prompts after 5 pm and on weekends Office Phone: [phone] Office Fax: [phone]

## 2017-04-25 NOTE — Patient Instructions (Signed)

## 2017-04-29 NOTE — Telephone Encounter (Signed)
Discussed with Dr C. He will re-assess [name] therapy during next f/u visit and initiate samples during office visit.

## 2017-04-30 ENCOUNTER — Ambulatory Visit: Payer: Medicare Other | Admitting: Cardiovascular Disease

## 2017-04-30 ENCOUNTER — Encounter: Payer: Self-pay | Admitting: Cardiovascular Disease

## 2017-04-30 VITALS — BP 116/68 | HR 70 | Ht 70.0 in | Wt 171.6 lb

## 2017-04-30 DIAGNOSIS — I495 Sick sinus syndrome: Secondary | ICD-10-CM

## 2017-04-30 DIAGNOSIS — Z8673 Personal history of transient ischemic attack (TIA), and cerebral infarction without residual deficits: Secondary | ICD-10-CM

## 2017-04-30 DIAGNOSIS — I5042 Chronic combined systolic (congestive) and diastolic (congestive) heart failure: Secondary | ICD-10-CM | POA: Diagnosis not present

## 2017-04-30 DIAGNOSIS — I472 Ventricular tachycardia, unspecified: Secondary | ICD-10-CM

## 2017-04-30 DIAGNOSIS — Z950 Presence of cardiac pacemaker: Secondary | ICD-10-CM

## 2017-04-30 DIAGNOSIS — I2510 Atherosclerotic heart disease of native coronary artery without angina pectoris: Secondary | ICD-10-CM | POA: Diagnosis not present

## 2017-04-30 DIAGNOSIS — I951 Orthostatic hypotension: Secondary | ICD-10-CM | POA: Diagnosis not present

## 2017-04-30 DIAGNOSIS — Z79899 Other long term (current) drug therapy: Secondary | ICD-10-CM | POA: Diagnosis not present

## 2017-04-30 NOTE — Patient Instructions (Signed)
Dr Sallyanne Kuster recommends that you continue on your current medications as directed. Please refer to the Current Medication list given to you today.  Your physician recommends that you return for lab work at your convenience.  Remote monitoring is used to monitor your Pacemaker of ICD from home. This monitoring reduces the number of office visits required to check your device to one time per year. It allows [place] to keep an eye on the functioning of your device to ensure it is working properly. You are scheduled for a device check from home on Wednesday, June 26th, 2019. You may send your transmission at any time that day. If you have a wireless device, the transmission will be sent automatically. After your physician reviews your transmission, you will receive a postcard with your next transmission date.  Dr Sallyanne Kuster recommends that you schedule a follow-up appointment in 6 months with a pacemaker check. You will receive a reminder letter in the mail two months in advance. If you don't receive a letter, please call our office to schedule the follow-up appointment.  If you need a refill on your cardiac medications before your next appointment, please call your pharmacy.

## 2017-04-30 NOTE — Progress Notes (Signed)
Patient ID: Adam [name], male   DOB: Jan 28, 1927, 84 y.o.   MRN: [id number]    Cardiology Office Note    Date:  05/02/2017   ID:  Adam [name], DOB [birth year], MRN [id number]  PCP:  Adam Serve, MD  Cardiologist:   Adam [name], MD   Chief complaint: pacemaker ERI  History of Present Illness:  Adam [name] is a 84 y.o. male with a long-standing history of coronary artery disease and previous bypass surgery with an extensive scar from inferior wall myocardial infarction, ischemic cardiomyopathy with combined systolic and diastolic heart failure, history of sustained symptomatic ventricular tachycardia (probably "scar VT"), previous ischemic stroke, sinus node dysfunction with dual chamber permanent pacemaker (generator change 2018 [employer]), severe orthostatic hypotension that is felt to be neurogenic in etiology.   Pacemaker function is normal with battery at beginning of life.  He is "atrially dependent". Lead parameters remain normal. He also has 58 % ventricular pacing. His pacemaker has not shown recent VT, and it has never recorded atrial fibrillation.   Despite taking high dose ProAmatine 3 times daily he still has severe dizziness.  He is cautious and has not had any falls.  Is very sedentary and denies angina or dyspnea.  He is not aware of any palpitations.  He has not had full-blown syncope.  He now has a permanent Foley in place.  He has CAD and is currently free of angina pectoris. He underwent bypass surgery 1996. He had placement of a stent to the SVG to RCA in 2011. His last cardiac catheterization in 2012 show that vessel to be widely patent but all his native coronary arteries are occluded and he is graft dependent. He is nuclear stress test in December 2015 showed an extensive inferior wall scar without any areas of ischemia. in June 2016 he presented with rapid palpitations associated with near-syncope, dyspnea and extreme fatigue , but did not lose  consciousness. Interrogation of his pacemaker showed relatively slow ventricular tachycardia at around 150 bpm that appear to have a regular, monomorphic electrogram. His dose of amiodarone was increased and he has not had ventricular tachycardia since. He was evaluated by Dr. Caryl [name]. After some debate, we decided that a conservative approach is most appropriate in view of his declining overall functional status and advanced age. Unfortunately has also been developing progressive dementia, primarily manifested as short-term memory loss. He is now a resident at [facility]. In November 2017 he presented with a right lower extremity weakness and was diagnosed as having an ischemic stroke. He was prescribed an oral anticoagulant due to suspicion that his stroke was related to atrial fibrillation, but atrial fibrillation has never occurred/has never been recorded by his pacemaker so this medication was stopped.    Past Medical History:  Diagnosis Date  . Arthritis    "minor, back and sometimes knees" (12/15/2012)  . Bradycardia    AFib/SSS s/p St Jude PPM 04/12/2008  . CAD (coronary artery disease) 12/30/2014   CABG (LIMA-LAD, SVG-RCA, SVG-OM in 1996).  07/2009 BMS to SVG-RCA. Cath in 04/2010 with patent stents   . Cardiomyopathy, ischemic 08/25/2012  . CHF (congestive heart failure) ([place])   . Chronic knee pain 12/03/2014  . Combined congestive systolic and diastolic heart failure ([place]) 02/02/2015   Hx EF 41%. BNP 96.8 02/21/15 Torsemide 04/06/15 Na 142, K 4.6, Bun 16, creat 0.89 04/20/14 BNP 111.7, Na 142, K 4.6, Bun 16, creat 0.9   . Depression with anxiety 02/02/2015  02/21/15 Hgb A1c 5.8 03/10/15 MMSE 30/30   . Dizziness, after diuretic asscoiated with hypotension and responded to fluid bolus 06/05/2011   04/28/15 US carotid R+L normal bilateral arterial velocities.    Marland Kitchen Dyspnea 08/11/2014   Followed in Pulmonary clinic/ [facility]/ [place]  - 08/11/2014  Walked RA x 1 laps @ 185 ft each stopped due to  fatigue/off balance/ slow pace/  no sob or desat  - PFT's  09/26/2014  FEV1 2.26 (85 % ) ratio 76  p no % improvement from saba with DLCO  67 % corrects to 93 % for alv volume      Since prev study 08/04/13 minimal change lung vol or dlco    . Embolic cerebral infarction ([place]) 12/06/2015  . Exertional shortness of breath    "sometimes walking" (12/15/2012)  . GERD (gastroesophageal reflux disease)   . Gout 02/09/2015  . Heart murmur    "just told I had one today" (12/15/2012)  . Hiatal hernia   . Hyperlipidemia   . Hypertension   . Hypothyroid   . Influenza A 03/10/2016  . Insomnia   . Melanoma of back ([place]) 1976  . Myocardial infarction [facility]) 1996; 2011   "both silent" (12/15/2012)  . Nonrheumatic aortic valve stenosis   . Orthostatic hypotension   . Osteoporosis, senile   . Pacemaker   . RBBB   . Restless leg 02/02/2015  . Right leg weakness 12/06/2015  . [facility] spotted fever   . S/P CABG x 4   . Sick sinus syndrome ([place]) 01/31/2014  . Sustained ventricular tachycardia ([place]) 07/27/2014  . Urinary retention 12/30/2014    Past Surgical History:  Procedure Laterality Date  . CARDIAC CATHETERIZATION  04/2010   LIMA to LAD patent,SVG to OM patent,no in-stnet restenosis RCA  . CATARACT EXTRACTION W/ INTRAOCULAR LENS  IMPLANT, BILATERAL Bilateral 2012  . CORONARY ANGIOPLASTY WITH STENT PLACEMENT  07/2009   bare metal stent to SVG to the RCA  . CORONARY ARTERY BYPASS GRAFT  1996   LIMA to LAD,SVG to RCA & SVG to OM  . INSERT / REPLACE / REMOVE PACEMAKER  2010  . MELANOMA EXCISION  05/1974 X2   "taken off my back" (12/15/2012)  . NM MYOVIEW LTD  06/2011   low risk  . PPM GENERATOR CHANGEOUT N/A 01/22/2017   Procedure: PPM GENERATOR CHANGEOUT;  Surgeon: Adam [name], MD;  Location: [facility] [facility];  Service: Cardiovascular;  Laterality: N/A;  . TONSILLECTOMY  1938  . TRANSURETHRAL RESECTION OF PROSTATE  1986  . US ECHOCARDIOGRAPHY  07/11/2009   EF 45-50%    Current  Medications: Outpatient Medications Prior to Visit  Medication Sig Dispense Refill  . acetaminophen (TYLENOL) 325 MG tablet Take 650 mg by mouth as needed for moderate pain or headache.     Marland Kitchen amiodarone (PACERONE) 100 MG tablet Take 1 tablet (100 mg total) by mouth daily. 90 tablet 3  . aspirin EC 81 MG tablet Take 1 tablet (81 mg total) by mouth daily. 30 tablet 1  . Cholecalciferol (VITAMIN D3) 5000 UNITS CAPS Take 5,000 Units by mouth every Monday.     . Clobetasol Propionate 0.05 % lotion Apply 1 application topically 2 (two) times daily.    . clopidogrel (PLAVIX) 75 MG tablet TAKE 1 TABLET ONCE DAILY. 90 tablet 0  . guaiFENesin (MUCINEX) 600 MG 12 hr tablet Take 600 mg by mouth daily as needed for cough.     . levothyroxine (SYNTHROID, LEVOTHROID) 137  MCG tablet Take 1 tablet (137 mcg total) by mouth daily before breakfast. 90 tablet 3  . midodrine (PROAMATINE) 10 MG tablet TAKE 1 TABLET THREE TIMES DAILY. TAKE LAST DOSE AT LEAST 6 HOURS BEFORE BEDTIME. 180 tablet 0  . pantoprazole (PROTONIX) 40 MG tablet TAKE 1 TABLET BY MOUTH TWICE DAILY. 60 tablet 11  . polyethylene glycol (MIRALAX / GLYCOLAX) packet Take 17 g by mouth 2 (two) times daily. Hold for loose stool    . pravastatin (PRAVACHOL) 40 MG tablet Take 1 tablet (40 mg total) by mouth at bedtime. 90 tablet 3  . rOPINIRole (REQUIP) 0.5 MG tablet TAKE 1 TABLET 3 TIMES A DAY AS NEEDED. 90 tablet 0  . sennosides-docusate sodium (SENOKOT-S) 8.6-50 MG tablet Take 2 tablets by mouth as needed for constipation.     . torsemide (DEMADEX) 10 MG tablet Take 10 mg by mouth every other day.    . traZODone (DESYREL) 100 MG tablet Take 1 tablet (100 mg total) by mouth at bedtime. May take an additional 25mg  at bedtime as needed for sleep. 30 tablet 3  . valACYclovir (VALTREX) 1000 MG tablet Take 1 tablet (1,000 mg total) by mouth 3 (three) times daily. (Patient taking differently: Take 1 tablet (1,000 mg total) by mouth 3 (three) times daily.) 21  tablet 0  . white petrolatum (VASELINE) GEL Apply 1 application topically as needed (rectal irritation).     No facility-administered medications prior to visit.      Allergies:   Altace [ramipril]; Crestor [rosuvastatin calcium]; Penicillins; and Sulfa antibiotics   Social History   Socioeconomic History  . Marital status: Married    Spouse name: Not on file  . Number of children: 2  . Years of education: Masters  . Highest education level: Not on file  Occupational History  . Occupation: Retired Company secretary -Pensions consultant  Social Needs  . Financial resource strain: Not on file  . Food insecurity:    Worry: Not on file    Inability: Not on file  . Transportation needs:    Medical: Not on file    Non-medical: Not on file  Tobacco Use  . Smoking status: Never Smoker  . Smokeless tobacco: Never Used  Substance and Sexual Activity  . Alcohol use: No    Alcohol/week: 0.0 oz  . Drug use: No  . Sexual activity: Never  Lifestyle  . Physical activity:    Days per week: Not on file    Minutes per session: Not on file  . Stress: Not on file  Relationships  . Social connections:    Talks on phone: Not on file    Gets together: Not on file    Attends religious service: Not on file    Active member of club or organization: Not on file    Attends meetings of clubs or organizations: Not on file    Relationship status: Not on file  Other Topics Concern  . Not on file  Social History Narrative   Lives at [facility] [place] [address]   Married - Violet   Never smoked   Alcohol none   Previously employed as Scientist, forensic for [employer].         Diet:Low sodium   Do you drink/eat things with caffeine? No   Marital status: Married                              What  year were you married?1950   Do you live in a house, apartment, assisted living, condo, trailer, etc)?    Is it one or more stories? 1   How many persons live in your home? 2   Do you have  any pets in your home? No   Current or past profession: Minister, [employer]iore[employer]   Do you exercise?     Very Little                                                 Type & how often:    Do you have a living will?  Yes   Do you have a DNR Form? Yes   Do you have a POA/HPOA forms? Yes     Family History:  The patient's family history includes Anuerysm in his son; Coronary artery disease in his father and mother; Diabetes in his father and mother; Heart disease in his brother, father, and mother; Lung cancer in his father.   ROS:   Please see the history of present illness.    ROS All other systems reviewed and are negative.   PHYSICAL EXAM:   VS:  BP 116/68   Pulse 70   Ht 5\' 10"  (1.778 m)   Wt 171 lb 9.6 oz (77.8 kg)   BMI 24.62 kg/m      General: Alert, oriented x3, no distress, is rather frail, but not acutely ill Head: no evidence of trauma, PERRL, EOMI, no exophtalmos or lid lag, no myxedema, no xanthelasma; normal ears, nose and oropharynx Neck: normal jugular venous pulsations and no hepatojugular reflux; brisk carotid pulses without delay and no carotid bruits Chest: clear to auscultation, no signs of consolidation by percussion or palpation, normal fremitus, symmetrical and full respiratory excursions.  Healed pacemaker site Cardiovascular: normal position and quality of the apical impulse, regular rhythm, normal first and the split second heart sounds, no murmurs, rubs or gallops Abdomen: no tenderness or distention, no masses by palpation, no abnormal pulsatility or arterial bruits, normal bowel sounds, no hepatosplenomegaly Extremities: no clubbing, cyanosis or edema; 2+ radial, ulnar and brachial pulses bilaterally; 2+ right femoral, posterior tibial and dorsalis pedis pulses; 2+ left femoral, posterior tibial and dorsalis pedis pulses; no subclavian or femoral bruits Neurological: grossly nonfocal Psych: Normal mood and affect  Psych: Normal mood and  affect    Wt Readings from Last 3 Encounters:  04/30/17 171 lb 9.6 oz (77.8 kg)  04/25/17 176 lb (79.8 kg)  04/21/17 171 lb (77.6 kg)      Studies/Labs Reviewed:   EKG:  EKG is ordered today and shows atrial paced, ventricular sensed rhythm with pre-existing right bundle branch block and left anterior fascicular block.  Prolonged AV delay 212 ms, QRS 196 ms, QTC 563 ms  Recent Labs: 09/17/2016: Pro B Natriuretic peptide (BNP) 721.0 01/01/2017: ALT 25 04/07/2017: BUN 15; Creatinine, Ser 0.93; Hemoglobin 13.7; Platelets 230; Potassium 4.1; Sodium 138 04/30/2017: TSH 0.449   Lipid Panel    Component Value Date/Time   CHOL 84 12/07/2015 0449   TRIG 102 12/07/2015 0449   HDL 41 12/07/2015 0449   CHOLHDL 2.0 12/07/2015 0449   VLDL 20 12/07/2015 0449   LDLCALC 23 12/07/2015 0449     ASSESSMENT:    1. VT (ventricular tachycardia) ([place])   2. On amiodarone therapy   3. SSS (  sick sinus syndrome) ([place])   4. Pacemaker   5. Chronic combined systolic and diastolic congestive heart failure ([place])   6. Orthostatic hypotension   7. Coronary artery disease involving native coronary artery of native heart without angina pectoris   8. History of arterial ischemic stroke      PLAN:  In order of problems listed above:  1. VT: None seen in the last >18 months.  In particular, no VT seen since we reduce the dose of amiodarone to only 100 mg daily.  VT is probably related to inferior scar. 2. Amiodarone:  Need to make sure he has thyroid and liver function tests every 6 months.   3. SSS: He has no intrinsic atrial activity. "Atrially dependent". 4. PM recent generator change: Healed surgical site.  He is "atrially dependent". 5. CHF: No evidence of hypervolemia by exam today.  Avoid fludrocortisone due to risk of heart failure exacerbation.  He is on a very low dose of diuretic. 6. Orthostatic hypotension: Years ago he had severe hypertension requiring multiple agents for control.  Now,  orthostatic hypotension is the most serious source of morbidity.  He has carefully implemented behavioral changes: slow changes in position, sleeping with the head of the bed elevated, wearing compression stockings, trying to stay well-hydrated.  He is on a maximum dose of midodrine.  We will try Northerra.  Give him some samples today and a titration schedule. 7. CAD: Remains asymptomatic.  Has an extensive scar, but no reversible ischemia on his most recent nuclear study from November 2014. He has occluded native arteries and is graft dependent (LIMA to LAD, SVG to RCA, SVG to OM; status post stent in SVG to RCA 2011, patent by cath March 2012). November 2014 nuclear study shows inferior scar without ischemia, EF 41%.Conservative management is recommended. 8. History of CVA: His pacemaker has never shown evidence of atrial arrhythmia (flutter or fibrillation) that could explain his stroke. Anticoagulants are currently not indicated. Continue clopidogrel.      Medication Adjustments/Labs and Tests Ordered: Current medicines are reviewed at length with the patient today.  Concerns regarding medicines are outlined above.  Medication changes, Labs and Tests ordered today are listed in the Patient Instructions below. Patient Instructions  Dr Sallyanne Kuster recommends that you continue on your current medications as directed. Please refer to the Current Medication list given to you today.  Your physician recommends that you return for lab work at your convenience.  Remote monitoring is used to monitor your Pacemaker of ICD from home. This monitoring reduces the number of office visits required to check your device to one time per year. It allows [place] to keep an eye on the functioning of your device to ensure it is working properly. You are scheduled for a device check from home on Wednesday, June 26th, 2019. You may send your transmission at any time that day. If you have a wireless device, the transmission will  be sent automatically. After your physician reviews your transmission, you will receive a postcard with your next transmission date.  Dr Sallyanne Kuster recommends that you schedule a follow-up appointment in 6 months with a pacemaker check. You will receive a reminder letter in the mail two months in advance. If you don't receive a letter, please call our office to schedule the follow-up appointment.  If you need a refill on your cardiac medications before your next appointment, please call your pharmacy.    Signed, Adam [name], MD  05/02/2017 11:09 AM  [facility] [address], [place], [place]  [address] Phone: [phone]; Fax: [phone]

## 2017-05-01 ENCOUNTER — Telehealth: Payer: Self-pay | Admitting: Cardiovascular Disease

## 2017-05-01 LAB — TSH: TSH: 0.449 u[IU]/mL — ABNORMAL LOW (ref 0.450–4.500)

## 2017-05-01 NOTE — Telephone Encounter (Signed)
Pt c/o medication issue:  1. Name of Medication: [name]   2. How are you currently taking this medication (dosage and times per day)?   3. Are you having a reaction (difficulty breathing--STAT)?no  4. What is your medication issue? Patient daughter Caryl Asp) calling, states that she would like to know if the Lauree Chandler is an additional medication to take with Midodrine or if it is supposed to replace the medication?  [name] states that if you can't reach her to please leave the answer on her voicemail.

## 2017-05-01 NOTE — Telephone Encounter (Signed)
Discussed with Chelley T covering for Dr Sallyanne Kuster and patient is to continue Midodrine with [name]. Samples given to patient with instructions on how to take. Advised daughter.

## 2017-05-02 ENCOUNTER — Telehealth: Payer: Self-pay | Admitting: *Deleted

## 2017-05-02 MED ORDER — HYDROCORTISONE 0.5 % EX OINT: 1.0000 "application " | TOPICAL_OINTMENT | Freq: Two times a day (BID) | CUTANEOUS | 0 refills | Status: DC

## 2017-05-02 NOTE — Telephone Encounter (Signed)
Spoke with the patient and he verbalized understanding the instructions given by Dr. Bubba Camp. Patient has an appointment with Dr. Bubba Camp 05/07/17 at 11:30 am.

## 2017-05-02 NOTE — Telephone Encounter (Signed)
Patient called and stated that he still has the rash on his back that itches and painful. Was seen on the 22nd and given medication. Is taking the medication given as directed. Wants to know what he can do for the itching and pain. Please Advise.

## 2017-05-02 NOTE — Telephone Encounter (Signed)
He was recently placed on medication for shingles. I would have him apply OTC hydrocortisone cream 0.5% bid to affected area for itching. Sent script to [facility]. Take tylenol as needed for pain. If no improvement, I would like to see him in clinic next week to assess for need for pain medication for possible nerve pain.

## 2017-05-05 ENCOUNTER — Telehealth: Payer: Self-pay | Admitting: Pharmacist

## 2017-05-05 DIAGNOSIS — I2510 Atherosclerotic heart disease of native coronary artery without angina pectoris: Secondary | ICD-10-CM

## 2017-05-05 DIAGNOSIS — G903 Multi-system degeneration of the autonomic nervous system: Secondary | ICD-10-CM

## 2017-05-05 DIAGNOSIS — E039 Hypothyroidism, unspecified: Secondary | ICD-10-CM

## 2017-05-05 DIAGNOSIS — I5042 Chronic combined systolic (congestive) and diastolic (congestive) heart failure: Secondary | ICD-10-CM

## 2017-05-05 LAB — TSH: TSH: 0.34 mIU/L — ABNORMAL LOW (ref 0.40–4.50)

## 2017-05-05 LAB — COMPLETE METABOLIC PANEL WITH GFR
AG Ratio: 1.3 (calc) (ref 1.0–2.5)
ALT: 22 U/L (ref 9–46)
AST: 28 U/L (ref 10–35)
Albumin: 3.7 g/dL (ref 3.6–5.1)
Alkaline phosphatase (APISO): 72 U/L (ref 40–115)
BUN: 10 mg/dL (ref 7–25)
CO2: 32 mmol/L (ref 20–32)
Calcium: 8.9 mg/dL (ref 8.6–10.3)
Chloride: 103 mmol/L (ref 98–110)
Creat: 0.8 mg/dL (ref 0.70–1.11)
GFR, Est African American: 92 mL/min/{1.73_m2} (ref >=60)
GFR, Est Non African American: 80 mL/min/{1.73_m2} (ref >=60)
Globulin: 2.8 g/dL (calc) (ref 1.9–3.7)
Glucose, Bld: 90 mg/dL (ref 65–99)
Potassium: 4.6 mmol/L (ref 3.5–5.3)
Sodium: 140 mmol/L (ref 135–146)
Total Bilirubin: 0.5 mg/dL (ref 0.2–1.2)
Total Protein: 6.5 g/dL (ref 6.1–8.1)

## 2017-05-05 LAB — LIPID PANEL
Cholesterol: 103 mg/dL (ref <200)
HDL: 39 mg/dL — ABNORMAL LOW (ref >40)
LDL Cholesterol (Calc): 43 mg/dL (calc)
Non-HDL Cholesterol (Calc): 64 mg/dL (calc) (ref <130)
Total CHOL/HDL Ratio: 2.6 (calc) (ref <5.0)
Triglycerides: 129 mg/dL (ref <150)

## 2017-05-05 NOTE — Telephone Encounter (Signed)
Pt's wife called to report patient is to increase [name] 100mg  from BID to TID today. His highest BP number was 144/85 and no other problems noted.   Okay to increased dose as instructed and continue to monitor BP and symptoms. Call clinic as needed for further instructions.

## 2017-05-07 ENCOUNTER — Non-Acute Institutional Stay: Payer: Medicare Other | Admitting: Internal Medicine

## 2017-05-07 ENCOUNTER — Encounter: Payer: Self-pay | Admitting: Internal Medicine

## 2017-05-07 VITALS — BP 124/70 | HR 70 | Temp 97.5°F | Resp 16 | Ht 70.0 in | Wt 173.0 lb

## 2017-05-07 DIAGNOSIS — I5042 Chronic combined systolic (congestive) and diastolic (congestive) heart failure: Secondary | ICD-10-CM | POA: Diagnosis not present

## 2017-05-07 DIAGNOSIS — B029 Zoster without complications: Secondary | ICD-10-CM

## 2017-05-07 DIAGNOSIS — E039 Hypothyroidism, unspecified: Secondary | ICD-10-CM | POA: Diagnosis not present

## 2017-05-07 NOTE — Progress Notes (Signed)
[facility]  Provider: Blanchie Serve MD   Location:      Place of Service:  Clinic (12)  PCP: Blanchie Serve, MD Patient Care Team: Blanchie Serve, MD as PCP - General (Internal Medicine) Croitoru, Dani Gobble, MD as Attending Physician (Cardiology) Vevelyn Royals, MD as Consulting Physician (Ophthalmology) Franchot Gallo, MD as Consulting Physician (Urology) Tanda Rockers, MD as Consulting Physician (Pulmonary Disease) Allyn Kenner, MD (Dermatology) Deliah Goody, PA-C as Physician Assistant (Physician Assistant) Ngetich, Nelda Bucks, NP as Nurse Practitioner (Family Medicine)  Extended Emergency Contact Information Primary Emergency Contact: Cooksey,[name] Address: [address]          [place] Phone: [phone] Mobile Phone: [phone] Relation: Spouse Secondary Emergency Contact: [name],Francisco  [place] Mobile Phone: [phone] Relation: Daughter   Chief Complaint  Patient presents with  . Acute Visit    2 week follow up  . Results    Discuss labs    HPI: Patient is a 84 y.o. male seen today for follow up on his shingles rash and CHF. He has been taking torsemide every other day and tolerating it well. He has dizziness and has now been on northera along with midodrine. His amiodarone dosing was decreased by cardiology last visit. Note reviewed.   Past Medical History:  Diagnosis Date  . Arthritis    "minor, back and sometimes knees" (12/15/2012)  . Bradycardia    AFib/SSS s/p St Jude PPM 04/12/2008  . CAD (coronary artery disease) 12/30/2014   CABG (LIMA-LAD, SVG-RCA, SVG-OM in 1996).  07/2009 BMS to SVG-RCA. Cath in 04/2010 with patent stents   . Cardiomyopathy, ischemic 08/25/2012  . CHF (congestive heart failure) ([place])   . Chronic knee pain 12/03/2014  . Combined congestive systolic and diastolic heart failure ([place]) 02/02/2015   Hx EF 41%. BNP 96.8 02/21/15 Torsemide 04/06/15 Na 142, K  4.6, Bun 16, creat 0.89 04/20/14 BNP 111.7, Na 142, K 4.6, Bun 16, creat 0.9   . Depression with anxiety 02/02/2015   02/21/15 Hgb A1c 5.8 03/10/15 MMSE 30/30   . Dizziness, after diuretic asscoiated with hypotension and responded to fluid bolus 06/05/2011   04/28/15 US carotid R+L normal bilateral arterial velocities.    Marland Kitchen Dyspnea 08/11/2014   Followed in Pulmonary clinic/ [facility]/ [place]  - 08/11/2014  Walked RA x 1 laps @ 185 ft each stopped due to fatigue/off balance/ slow pace/  no sob or desat  - PFT's  09/26/2014  FEV1 2.26 (85 % ) ratio 76  p no % improvement from saba with DLCO  67 % corrects to 93 % for alv volume      Since prev study 08/04/13 minimal change lung vol or dlco    . Embolic cerebral infarction ([place]) 12/06/2015  . Exertional shortness of breath    "sometimes walking" (12/15/2012)  . GERD (gastroesophageal reflux disease)   . Gout 02/09/2015  . Heart murmur    "just told I had one today" (12/15/2012)  . Hiatal hernia   . Hyperlipidemia   . Hypertension   . Hypothyroid   . Influenza A 03/10/2016  . Insomnia   . Melanoma of back ([place]) 1976  . Myocardial infarction [facility]) 1996; 2011   "both silent" (12/15/2012)  . Nonrheumatic aortic valve stenosis   . Orthostatic hypotension   . Osteoporosis, senile   . Pacemaker   . RBBB   . Restless leg 02/02/2015  . Right leg  weakness 12/06/2015  . [facility] spotted fever   . S/P CABG x 4   . Sick sinus syndrome ([place]) 01/31/2014  . Sustained ventricular tachycardia ([place]) 07/27/2014  . Urinary retention 12/30/2014   Past Surgical History:  Procedure Laterality Date  . CARDIAC CATHETERIZATION  04/2010   LIMA to LAD patent,SVG to OM patent,no in-stnet restenosis RCA  . CATARACT EXTRACTION W/ INTRAOCULAR LENS  IMPLANT, BILATERAL Bilateral 2012  . CORONARY ANGIOPLASTY WITH STENT PLACEMENT  07/2009   bare metal stent to SVG to the RCA  . CORONARY ARTERY BYPASS GRAFT  1996   LIMA to LAD,SVG to RCA & SVG to OM  . INSERT / REPLACE  / REMOVE PACEMAKER  2010  . MELANOMA EXCISION  05/1974 X2   "taken off my back" (12/15/2012)  . NM MYOVIEW LTD  06/2011   low risk  . PPM GENERATOR CHANGEOUT N/A 01/22/2017   Procedure: PPM GENERATOR CHANGEOUT;  Surgeon: [name], MD;  Location: [place] [facility];  Service: Cardiovascular;  Laterality: N/A;  . TONSILLECTOMY  1938  . TRANSURETHRAL RESECTION OF PROSTATE  1986  . US ECHOCARDIOGRAPHY  07/11/2009   EF 45-50%    reports that he has never smoked. He has never used smokeless tobacco. He reports that he does not drink alcohol or use drugs. Social History   Socioeconomic History  . Marital status: Married    Spouse name: Not on file  . Number of children: 2  . Years of education: Masters  . Highest education level: Not on file  Occupational History  . Occupation: Retired Company secretary -Pensions consultant  Social Needs  . Financial resource strain: Not on file  . Food insecurity:    Worry: Not on file    Inability: Not on file  . Transportation needs:    Medical: Not on file    Non-medical: Not on file  Tobacco Use  . Smoking status: Never Smoker  . Smokeless tobacco: Never Used  Substance and Sexual Activity  . Alcohol use: No    Alcohol/week: 0.0 oz  . Drug use: No  . Sexual activity: Never  Lifestyle  . Physical activity:    Days per week: Not on file    Minutes per session: Not on file  . Stress: Not on file  Relationships  . Social connections:    Talks on phone: Not on file    Gets together: Not on file    Attends religious service: Not on file    Active member of club or organization: Not on file    Attends meetings of clubs or organizations: Not on file    Relationship status: Not on file  . Intimate partner violence:    Fear of current or ex partner: Not on file    Emotionally abused: Not on file    Physically abused: Not on file    Forced sexual activity: Not on file  Other Topics Concern  . Not on file  Social History Narrative   Lives  at [place] to [place] 01/09/15   Married - Violet   Never smoked   Alcohol none   Previously employed as Scientist, forensic for [employer].         Diet:Low sodium   Do you drink/eat things with caffeine? No   Marital status: Married  What year were you married?1950   Do you live in a house, apartment, assisted living, condo, trailer, etc)?    Is it one or more stories? 1   How many persons live in your home? 2   Do you have any pets in your home? No   Current or past profession: Minister, [employer]iore[employer]   Do you exercise?     Very Little                                                 Type & how often:    Do you have a living will?  Yes   Do you have a DNR Form? Yes   Do you have a POA/HPOA forms? Yes     Family History  Problem Relation Age of Onset  . Coronary artery disease Mother   . Diabetes Mother   . Heart disease Mother   . Coronary artery disease Father   . Diabetes Father   . Lung cancer Father   . Heart disease Father   . Anuerysm Son   . Heart disease Brother     Health Maintenance  Topic Date Due  . PNA vac Low Risk Adult (2 of 2 - PCV13) 10/06/2010  . TETANUS/TDAP  08/01/2026 (Originally 08/01/2024)  . INFLUENZA VACCINE  09/04/2017  . DEXA SCAN  12/14/2024    Allergies  Allergen Reactions  . Altace [Ramipril] Cough  . Crestor [Rosuvastatin Calcium] Rash  . Penicillins Rash and Other (See Comments)    Has patient had a PCN reaction causing immediate rash, facial/tongue/throat swelling, SOB or lightheadedness with hypotension: Yes Has patient had a PCN reaction causing severe rash involving mucus membranes or skin necrosis: No Has patient had a PCN reaction that required hospitalization No Has patient had a PCN reaction occurring within the last 10 years: No If all of the above answers are "NO", then may proceed with Cephalosporin use.   . Sulfa Antibiotics Rash    Outpatient Encounter Medications as  of 05/07/2017  Medication Sig  . acetaminophen (TYLENOL) 325 MG tablet Take 650 mg by mouth as needed for moderate pain or headache.   Marland Kitchen amiodarone (PACERONE) 100 MG tablet Take 1 tablet (100 mg total) by mouth daily.  Marland Kitchen aspirin EC 81 MG tablet Take 1 tablet (81 mg total) by mouth daily.  . Cholecalciferol (VITAMIN D3) 5000 UNITS CAPS Take 5,000 Units by mouth every Monday.   . Clobetasol Propionate 0.05 % lotion Apply 1 application topically 2 (two) times daily.  . clopidogrel (PLAVIX) 75 MG tablet TAKE 1 TABLET ONCE DAILY.  Marland Kitchen guaiFENesin (MUCINEX) 600 MG 12 hr tablet Take 600 mg by mouth daily as needed for cough.   . hydrocortisone ointment 0.5 % Apply 1 application topically 2 (two) times daily.  Marland Kitchen levothyroxine (SYNTHROID, LEVOTHROID) 137 MCG tablet Take 1 tablet (137 mcg total) by mouth daily before breakfast.  . midodrine (PROAMATINE) 10 MG tablet TAKE 1 TABLET THREE TIMES DAILY. TAKE LAST DOSE AT LEAST 6 HOURS BEFORE BEDTIME.  . pantoprazole (PROTONIX) 40 MG tablet TAKE 1 TABLET BY MOUTH TWICE DAILY.  Marland Kitchen polyethylene glycol (MIRALAX / GLYCOLAX) packet Take 17 g by mouth 2 (two) times daily. Hold for loose stool  . pravastatin (PRAVACHOL) 40 MG tablet Take 1 tablet (40 mg total) by mouth at bedtime.  Marland Kitchen rOPINIRole (  REQUIP) 0.5 MG tablet TAKE 1 TABLET 3 TIMES A DAY AS NEEDED.  Marland Kitchen sennosides-docusate sodium (SENOKOT-S) 8.6-50 MG tablet Take 2 tablets by mouth as needed for constipation.   . torsemide (DEMADEX) 10 MG tablet Take 10 mg by mouth every other day.  . traZODone (DESYREL) 100 MG tablet Take 1 tablet (100 mg total) by mouth at bedtime. May take an additional 25mg  at bedtime as needed for sleep.  . white petrolatum (VASELINE) GEL Apply 1 application topically as needed (rectal irritation).  . [DISCONTINUED] valACYclovir (VALTREX) 1000 MG tablet Take 1 tablet (1,000 mg total) by mouth 3 (three) times daily. (Patient taking differently: Take 1 tablet (1,000 mg total) by mouth 3 (three)  times daily.)   No facility-administered encounter medications on file as of 05/07/2017.     Review of Systems  Constitutional: Negative for appetite change.  Respiratory: Positive for cough. Negative for shortness of breath.   Cardiovascular: Positive for leg swelling. Negative for chest pain.       Denies worsening  Gastrointestinal: Negative for abdominal pain, diarrhea, nausea and vomiting.  Genitourinary: Negative for flank pain.  Musculoskeletal: Positive for gait problem.       Uses a walker to ambulate  Neurological: Positive for dizziness. Negative for headaches.  Psychiatric/Behavioral: Negative for behavioral problems.    Vitals:   05/07/17 1132  BP: 124/70  Pulse: 70  Resp: 16  Temp: (!) 97.5 F (36.4 C)  TempSrc: Oral  SpO2: 98%  Weight: 173 lb (78.5 kg)  Height: 5\' 10"  (1.778 m)   Body mass index is 24.82 kg/m.   Wt Readings from Last 3 Encounters:  05/07/17 173 lb (78.5 kg)  04/30/17 171 lb 9.6 oz (77.8 kg)  04/25/17 176 lb (79.8 kg)   Physical Exam  Constitutional: He is oriented to person, place, and time. He appears well-developed. No distress.  HENT:  Head: Normocephalic and atraumatic.  Mouth/Throat: Oropharynx is clear and moist.  Eyes: Pupils are equal, round, and reactive to light. Conjunctivae are normal. Right eye exhibits no discharge. Left eye exhibits no discharge.  Neck: Neck supple.  Cardiovascular: Normal rate and regular rhythm.  Murmur heard. Pulmonary/Chest: Effort normal and breath sounds normal.  Abdominal: Soft. Bowel sounds are normal.  Musculoskeletal: He exhibits edema.  Unsteady gait, uses a walker to ambulate  Neurological: He is alert and oriented to person, place, and time.  Skin: Skin is warm and dry. No rash noted. He is not diaphoretic.  Shingles rash to his back has resolved. Dry skin  Psychiatric: He has a normal mood and affect.    Labs reviewed: Basic Metabolic Panel: Recent Labs    01/16/17 1436  03/18/17 2113 04/07/17 2358 05/05/17 0000  NA 142 138 138 140  K 3.8 4.3 4.1 4.6  CL 103 104 104 103  CO2 27  --  26 32  GLUCOSE 105* 105* 97 90  BUN 13 10 15 10   CREATININE 0.91 0.70 0.93 0.80  CALCIUM 8.8  --  9.0 8.9   Liver Function Tests: Recent Labs    05/08/16 08/15/16 01/01/17 0000 05/05/17 0000  AST 20 22 29 28   ALT 19 21 25 22   ALKPHOS 78 62  --   --   BILITOT  --   --  0.4 0.5  PROT  --   --  6.3 6.5   No results for input(s): LIPASE, AMYLASE in the last 8760 hours. No results for input(s): AMMONIA in the last 8760 hours. CBC:  Recent Labs    07/31/16 1359  09/17/16 1517  01/16/17 1436 03/18/17 2026 03/18/17 2113 04/07/17 2358  WBC CANCELED   < > 6.1   < > 6.4 5.9  --  8.6  NEUTROABS CANCELED  --  4.0  --   --   --   --  6.2  HGB CANCELED   < > 13.4   < > 13.6 13.4 13.9 13.7  HCT CANCELED   < > 40.5   < > 40.3 40.4 41.0 41.1  MCV CANCELED   < > 98.6   < > 95 97.3  --  97.4  PLT CANCELED   < > 228.0   < > 261 224  --  230   < > = values in this interval not displayed.   Cardiac Enzymes: No results for input(s): CKTOTAL, CKMB, CKMBINDEX, TROPONINI in the last 8760 hours. BNP: Invalid input(s): POCBNP Lab Results  Component Value Date   HGBA1C 5.6 12/07/2015   Lab Results  Component Value Date   TSH 0.34 (L) 05/05/2017   No results found for: VITAMINB12 No results found for: FOLATE Lab Results  Component Value Date   FERRITIN 88 10/11/2016    Lipid Panel: Recent Labs    05/05/17 0000  CHOL 103  HDL 39*  LDLCALC 43  TRIG 129  CHOLHDL 2.6   Lab Results  Component Value Date   HGBA1C 5.6 12/07/2015    Procedures since last visit: US Scrotum W/doppler  Result Date: 04/08/2017 CLINICAL DATA:  84 year old with testicular pain. EXAM: SCROTAL ULTRASOUND DOPPLER ULTRASOUND OF THE TESTICLES TECHNIQUE: Complete ultrasound examination of the testicles, epididymis, and other scrotal structures was performed. Color and spectral Doppler  ultrasound were also utilized to evaluate blood flow to the testicles. COMPARISON:  None. FINDINGS: Right testicle Measurements: 3.8 x 2.3 x 2.3 cm. Homogeneous echogenicity with normal blood flow. No mass or microlithiasis visualized. Left testicle Measurements: 2.9 x 1.9 x 2.2 cm. Homogeneous echogenicity with normal blood flow. No mass or microlithiasis visualized. Right epididymis:  Normal in size and appearance. Left epididymis:  Normal in size and appearance. Hydrocele:  Large right and moderate left hydrocele. Varicocele:  Left measuring 4 mm. Pulsed Doppler interrogation of both testes demonstrates normal low resistance arterial and venous waveforms bilaterally. IMPRESSION: 1. Large right and moderate left hydrocele. 2. Normal sonographic appearance of the testicles with normal blood flow. 3. Left varicocele. Electronically Signed   By: Jeb Levering M.D.   On: 04/08/2017 01:17    Assessment/Plan  1. Hypothyroidism, unspecified type Borderline low TSH. Advised to continue levothyroxine and check TSH prior to next visit.  - Thyroid Panel With TSH; Future  2. Chronic combined systolic and diastolic congestive heart failure (HCC) Continue torsemide every other day and monitor.   3. Herpes zoster without complication Resolved.     Labs/tests ordered:   Lab Orders     Thyroid Panel With TSH  Next appointment: 3 months  Communication: reviewed care plan with patient and wife    Blanchie Serve, MD Internal Medicine [place] [place],  [address] Cell Phone (Monday-Friday 8 am - 5 pm): [phone] On Call: [phone] and follow prompts after 5 pm and on weekends Office Phone: [phone] Office Fax: [phone]

## 2017-05-09 ENCOUNTER — Telehealth: Payer: Self-pay | Admitting: Pharmacist Clinician (PhC)/ Clinical Pharmacy Specialist

## 2017-05-09 NOTE — Telephone Encounter (Signed)
Spoke with Ms. College.  She reports no improvement (or worsening) of patients OH.  She will increase the dose by 1 capsule every 3-4 days until at 200 mg tid then call to let us know if it's helping.

## 2017-05-15 ENCOUNTER — Other Ambulatory Visit: Payer: Self-pay | Admitting: Internal Medicine

## 2017-05-19 ENCOUNTER — Telehealth: Payer: Self-pay

## 2017-05-19 MED ORDER — NORMAL SALINE FLUSH 0.9 % IV SOLN
10.0000 mL | Freq: Every day | INTRAVENOUS | 3 refills | Status: DC | PRN
Start: 2017-05-19 — End: 2017-07-25

## 2017-05-19 MED ORDER — TRAZODONE HCL 50 MG PO TABS: 50.0000 mg | ORAL_TABLET | Freq: Every day | ORAL | 3 refills | Status: DC

## 2017-05-19 NOTE — Telephone Encounter (Signed)
  Have sent script for decreased dosing of trazodone to [facility], notify patient/ family.  Patient to take 50 mg daily at bedtime.  Script for normal saline also sent.

## 2017-05-19 NOTE — Telephone Encounter (Signed)
Patient is aware of the changes.

## 2017-05-19 NOTE — Telephone Encounter (Signed)
Spoke with the patient's daughter Locklan Canoy and she stated that he has been taking 50 mg of Trazodone instead of the 100 mg, she splits them in half. She also stated that he hasn't been having any confusion. When I asked her about the sterile water she stated that her mom uses it to irrigate his catheter.

## 2017-05-19 NOTE — Telephone Encounter (Signed)
An incoming fax was received from [facility] requesting that trazodone be changed from 100 mg to 50 mg at daughter's request to help avoid confusion with patient's medications.   Patient has also requested sterile H20 and pharmacy states that this requires a prescription.

## 2017-05-22 ENCOUNTER — Telehealth: Payer: Self-pay | Admitting: Cardiovascular Disease

## 2017-05-22 ENCOUNTER — Telehealth: Payer: Self-pay | Admitting: Physician Assistant

## 2017-05-22 MED ORDER — DROXIDOPA 100 MG PO CAPS: 200.0000 mg | ORAL_CAPSULE | Freq: Three times a day (TID) | ORAL | 6 refills | Status: DC

## 2017-05-22 NOTE — Telephone Encounter (Signed)
New Message    *STAT* If patient is at the pharmacy, call can be transferred to refill team.   1. Which medications need to be refilled? (please list name of each medication and dose if known) [name]  2. Which pharmacy/location (including street and city if local pharmacy) is medication to be sent to? [facility]   3. Do they need a 30 day or 90 day supply? 30 day

## 2017-05-22 NOTE — Telephone Encounter (Signed)
Pt c/o medication issue:  1. Name of Medication: Droxidopa   2. How are you currently taking this medication (dosage and times per day)? 100 mg 3x daily  3. Are you having a reaction (difficulty breathing--STAT)? no  4. What is your medication issue? Medication is not available at that pharmacy

## 2017-05-22 NOTE — Telephone Encounter (Signed)
On northera since 04/21/2017, SBP went up to 180 upon standing today, however his SBP has been good lately, only today's BP was high. I do not recommend change [name] dose based on a single day of elevated BP, continue monitor BP for now.   Hilbert Corrigan PA Pager: [phone]

## 2017-05-22 NOTE — Telephone Encounter (Signed)
Eliezer Lofts RN spoke with Claiborne Billings D and this Rx just needs to be voided, patient does not need at this time. Left message at [facility].

## 2017-05-26 ENCOUNTER — Telehealth: Payer: Self-pay | Admitting: *Deleted

## 2017-05-26 NOTE — Telephone Encounter (Signed)
Patient called and stated that his bottom is sore and red. No open wound. No trauma. Stated that he sits a lot. Didn't want to schedule an appointment. Wants to speak with Dr. Bubba Camp or [place].

## 2017-05-26 NOTE — Telephone Encounter (Signed)
Patient has an appointment tomorrow to be seen by Dinah to assess further.

## 2017-05-27 ENCOUNTER — Encounter: Payer: Medicare Other | Admitting: Family

## 2017-05-27 NOTE — Telephone Encounter (Signed)
Patient had not showed up for his 3:30 appointment with Webb Silversmith, NP. Called and spoke to patient's wife who stated that he went to the dermatologist earlier today and had things checked out and didn't need the appointment here. Appointment cancelled.

## 2017-05-30 ENCOUNTER — Other Ambulatory Visit: Payer: Self-pay | Admitting: Pharmacist

## 2017-05-30 MED ORDER — DROXIDOPA 100 MG PO CAPS: 200.0000 mg | ORAL_CAPSULE | Freq: Three times a day (TID) | ORAL | 1 refills | Status: DC

## 2017-06-02 ENCOUNTER — Telehealth: Payer: Self-pay | Admitting: Pharmacist Clinician (PhC)/ Clinical Pharmacy Specialist

## 2017-06-02 NOTE — Telephone Encounter (Signed)
Daughter called, patient has had 2 episodes in the past 2 weeks with elevated BP readings.  Family called on 5-43 with systolic at 606, then last night was 770 systolic, dropped to 340 then 178 over then next 20 minutes.  Patient felt fine, did not believe he was stressed.    Asked daughter to continue monitoring his home readings.  If the high readings happen more frequently than every 10-14 days, they need to look for patterns in his lifestyle and diet that might explain these random high readings.    Daughter voiced understanding.  She has our direct number

## 2017-06-11 ENCOUNTER — Telehealth: Payer: Self-pay | Admitting: Physician Assistant

## 2017-06-11 NOTE — Telephone Encounter (Signed)
Pt wife calling to request if they can stop the [name] for 2 weeks to see if it helps with pt BP. She reports BP has been fluctuating.  BP reading from most current is: 114/59 110/64 133/82 109/72 99/66 142/84 93/59 135/75 106/66 109/67 125/79 130/77 180/118 She report the highest was 187/100 and the lowest was 87/58. Routed to [place], [place] for recommendation.

## 2017-06-11 NOTE — Telephone Encounter (Signed)
NEW MESSAGE    Pt c/o medication issue:  1. Name of Medication: [name]   2. How are you currently taking this medication (dosage and times per day)?   3. Are you having a reaction (difficulty breathing--STAT)? no 4. What is your medication issue? Spouse calling to discuss stopping medication

## 2017-06-12 NOTE — Telephone Encounter (Signed)
Returned call to Pt's daughter. Unable to leave message.

## 2017-06-12 NOTE — Telephone Encounter (Signed)
BP stable with only occasional drop and spike. Please check to see if the patient's orthostatic symptom has been controlled, if he has worsening orthostatic hypotension symptom, please check with our clinic pharmacist to see if his Lauree Chandler can be increased

## 2017-06-12 NOTE — Telephone Encounter (Signed)
Follow up   Daughter returning to call nurse for recommendations

## 2017-06-12 NOTE — Telephone Encounter (Signed)
Unable to reach pt dtr or leave a message  

## 2017-06-13 ENCOUNTER — Other Ambulatory Visit: Payer: Self-pay | Admitting: Internal Medicine

## 2017-06-16 ENCOUNTER — Other Ambulatory Visit: Payer: Self-pay | Admitting: Pharmacist

## 2017-06-16 NOTE — Telephone Encounter (Signed)
LMTCB

## 2017-06-19 ENCOUNTER — Encounter: Payer: Self-pay | Admitting: Family

## 2017-06-19 ENCOUNTER — Non-Acute Institutional Stay: Payer: Medicare Other | Admitting: Family

## 2017-06-19 VITALS — BP 136/78 | HR 53 | Temp 97.5°F | Resp 18 | Ht 70.0 in | Wt 172.0 lb

## 2017-06-19 DIAGNOSIS — L89150 Pressure ulcer of sacral region, unstageable: Secondary | ICD-10-CM | POA: Diagnosis not present

## 2017-06-19 DIAGNOSIS — R197 Diarrhea, unspecified: Secondary | ICD-10-CM

## 2017-06-19 MED ORDER — LOPERAMIDE HCL 2 MG PO TABS: 2.0000 mg | ORAL_TABLET | Freq: Four times a day (QID) | ORAL | 0 refills | Status: DC | PRN

## 2017-06-19 NOTE — Patient Instructions (Addendum)
1.Apply zinc oxide cream to affected part on sacrum daily and as needed  2. Change position frequently and avoid sitting for prolong time. 3.Notify provider if ulcer worsen or not healed.   Diarrhea, Adult Diarrhea is when you have loose and water poop (stool) often. Diarrhea can make you feel weak and cause you to get dehydrated. Dehydration can make you tired and thirsty, make you have a dry mouth, and make it so you pee (urinate) less often. Diarrhea often lasts 2-3 days. However, it can last longer if it is a sign of something more serious. It is important to treat your diarrhea as told by your doctor. Follow these instructions at home: Eating and drinking  Follow these recommendations as told by your doctor:  Take an oral rehydration solution (ORS). This is a drink that is sold at pharmacies and stores.  Drink clear fluids, such as: ? Water. ? Ice chips. ? Diluted fruit juice. ? Low-calorie sports drinks.  Eat bland, easy-to-digest foods in small amounts as you are able. These foods include: ? Bananas. ? Applesauce. ? Rice. ? Low-fat (lean) meats. ? Toast. ? Crackers.  Avoid drinking fluids that have a lot of sugar or caffeine in them.  Avoid alcohol.  Avoid spicy or fatty foods.  General instructions   Drink enough fluid to keep your pee (urine) clear or pale yellow.  Wash your hands often. If you cannot use soap and water, use hand sanitizer.  Make sure that all people in your home wash their hands well and often.  Take over-the-counter and prescription medicines only as told by your doctor.  Rest at home while you get better.  Watch your condition for any changes.  Take a warm bath to help with any burning or pain from having diarrhea.  Keep all follow-up visits as told by your doctor. This is important. Contact a doctor if:  You have a fever.  Your diarrhea gets worse.  You have new symptoms.  You cannot keep fluids down.  You feel light-headed or  dizzy.  You have a headache.  You have muscle cramps. Get help right away if:  You have chest pain.  You feel very weak or you pass out (faint).  You have bloody or black poop or poop that look like tar.  You have very bad pain, cramping, or bloating in your belly (abdomen).  You have trouble breathing or you are breathing very quickly.  Your heart is beating very quickly.  Your skin feels cold and clammy.  You feel confused.  You have signs of dehydration, such as: ? Dark pee, hardly any pee, or no pee. ? Cracked lips. ? Dry mouth. ? Sunken eyes. ? Sleepiness. ? Weakness. This information is not intended to replace advice given to you by your health care provider. Make sure you discuss any questions you have with your health care provider. Document Released: 07/10/2007 Document Revised: 08/11/2015 Document Reviewed: 09/27/2014 Elsevier Interactive Patient Education  2018 [employer].

## 2017-06-19 NOTE — Progress Notes (Signed)
Location:  [employer] of Service:  Clinic (12) Provider: Andrian Sabala FNP-C  Blanchie Serve, MD  Patient Care Team: Blanchie Serve, MD as PCP - General (Internal Medicine) [name], MD as Attending Physician (Cardiology) Vevelyn Royals, MD as Consulting Physician (Ophthalmology) Franchot Gallo, MD as Consulting Physician (Urology) Tanda Rockers, MD as Consulting Physician (Pulmonary Disease) Allyn Kenner, MD (Dermatology) Deliah Goody, PA-C as Physician Assistant (Physician Assistant) Emelynn Rance, Nelda Bucks, NP as Nurse Practitioner (Family Medicine)  Extended Emergency Contact Information Primary Emergency Contact: Prichett,[name] Address: [address], [place] Phone: [phone] Mobile Phone: [phone] Relation: Spouse Secondary Emergency Contact: [name],Chavero  [place] Mobile Phone: [phone] Relation: Daughter  Code Status:  DNR Goals of care: Advanced Directive information Advanced Directives 06/19/2017  Does Patient Have a Medical Advance Directive? Yes  Type of Paramedic of [employer] [place];Living will  Does patient want to make changes to medical advance directive? -  Copy of [place] in Chart? Yes  Would patient like information on creating a medical advance directive? -  Pre-existing out of facility DNR order (yellow form or pink MOST form) -     Chief Complaint  Patient presents with  . Acute Visit    diarrhea    HPI:  Pt is a 84 y.o. male seen today at [facility] for an acute visit for evaluation of diarrhea.He states had x 4 loose stool yesterday and two episode this morning.He states care giver also noticed some mucous.He takes Miralax daily and senokot-S as needed.He did not take any yesterday and today. He eats one meal in the facility dinning [place]He denies eating out.He denies any  fever,nausea,vomiting or abdominal pain.He also denies any blood in the stool.  He also complains of a sore on his bottom that he applies Desitin cream. Patient's wife states he sleeps on his back all night on the recliner.He has cushion that he sits on unclear if it's regular pillow or gel cushion.      Past Medical History:  Diagnosis Date  . Arthritis    "minor, back and sometimes knees" (12/15/2012)  . Bradycardia    AFib/SSS s/p St Jude PPM 04/12/2008  . CAD (coronary artery disease) 12/30/2014   CABG (LIMA-LAD, SVG-RCA, SVG-OM in 1996).  07/2009 BMS to SVG-RCA. Cath in 04/2010 with patent stents   . Cardiomyopathy, ischemic 08/25/2012  . CHF (congestive heart failure) ([place])   . Chronic knee pain 12/03/2014  . Combined congestive systolic and diastolic heart failure ([place]) 02/02/2015   Hx EF 41%. BNP 96.8 02/21/15 Torsemide 04/06/15 Na 142, K 4.6, Bun 16, creat 0.89 04/20/14 BNP 111.7, Na 142, K 4.6, Bun 16, creat 0.9   . Depression with anxiety 02/02/2015   02/21/15 Hgb A1c 5.8 03/10/15 MMSE 30/30   . Dizziness, after diuretic asscoiated with hypotension and responded to fluid bolus 06/05/2011   04/28/15 US carotid R+L normal bilateral arterial velocities.    Marland Kitchen Dyspnea 08/11/2014   Followed in Pulmonary clinic/ [facility]/ [place]  - 08/11/2014  Walked RA x 1 laps @ 185 ft each stopped due to fatigue/off balance/ slow pace/  no sob or desat  - PFT's  09/26/2014  FEV1 2.26 (85 % ) ratio 76  p no % improvement from saba with DLCO  67 % corrects to 93 % for  alv volume      Since prev study 08/04/13 minimal change lung vol or dlco    . Embolic cerebral infarction ([place]) 12/06/2015  . Exertional shortness of breath    "sometimes walking" (12/15/2012)  . GERD (gastroesophageal reflux disease)   . Gout 02/09/2015  . Heart murmur    "just told I had one today" (12/15/2012)  . Hiatal hernia   . Hyperlipidemia   . Hypertension   . Hypothyroid   . Influenza A 03/10/2016  . Insomnia   . Melanoma of back  ([place]) 1976  . Myocardial infarction [facility]) 1996; 2011   "both silent" (12/15/2012)  . Nonrheumatic aortic valve stenosis   . Orthostatic hypotension   . Osteoporosis, senile   . Pacemaker   . RBBB   . Restless leg 02/02/2015  . Right leg weakness 12/06/2015  . [facility] spotted fever   . S/P CABG x 4   . Sick sinus syndrome ([place]) 01/31/2014  . Sustained ventricular tachycardia ([place]) 07/27/2014  . Urinary retention 12/30/2014   Past Surgical History:  Procedure Laterality Date  . CARDIAC CATHETERIZATION  04/2010   LIMA to LAD patent,SVG to OM patent,no in-stnet restenosis RCA  . CATARACT EXTRACTION W/ INTRAOCULAR LENS  IMPLANT, BILATERAL Bilateral 2012  . CORONARY ANGIOPLASTY WITH STENT PLACEMENT  07/2009   bare metal stent to SVG to the RCA  . CORONARY ARTERY BYPASS GRAFT  1996   LIMA to LAD,SVG to RCA & SVG to OM  . INSERT / REPLACE / REMOVE PACEMAKER  2010  . MELANOMA EXCISION  05/1974 X2   "taken off my back" (12/15/2012)  . NM MYOVIEW LTD  06/2011   low risk  . PPM GENERATOR CHANGEOUT N/A 01/22/2017   Procedure: PPM GENERATOR CHANGEOUT;  Surgeon: [name], MD;  Location: [place] [facility];  Service: Cardiovascular;  Laterality: N/A;  . TONSILLECTOMY  1938  . TRANSURETHRAL RESECTION OF PROSTATE  1986  . US ECHOCARDIOGRAPHY  07/11/2009   EF 45-50%    Allergies  Allergen Reactions  . Altace [Ramipril] Cough  . Crestor [Rosuvastatin Calcium] Rash  . Penicillins Rash and Other (See Comments)    Has patient had a PCN reaction causing immediate rash, facial/tongue/throat swelling, SOB or lightheadedness with hypotension: Yes Has patient had a PCN reaction causing severe rash involving mucus membranes or skin necrosis: No Has patient had a PCN reaction that required hospitalization No Has patient had a PCN reaction occurring within the last 10 years: No If all of the above answers are "NO", then may proceed with Cephalosporin use.   . Sulfa Antibiotics Rash     Outpatient Encounter Medications as of 06/19/2017  Medication Sig  . acetaminophen (TYLENOL) 325 MG tablet Take 650 mg by mouth as needed for moderate pain or headache.   Marland Kitchen amiodarone (PACERONE) 100 MG tablet Take 1 tablet (100 mg total) by mouth daily.  Marland Kitchen aspirin EC 81 MG tablet Take 1 tablet (81 mg total) by mouth daily.  . Cholecalciferol (VITAMIN D3) 5000 UNITS CAPS Take 5,000 Units by mouth every Monday.   . Clobetasol Propionate 0.05 % lotion Apply 1 application topically 2 (two) times daily.  . clopidogrel (PLAVIX) 75 MG tablet TAKE 1 TABLET ONCE DAILY.  Marland Kitchen Droxidopa 100 MG CAPS Take 200 mg by mouth 3 (three) times daily.  Marland Kitchen guaiFENesin (MUCINEX) 600 MG 12 hr tablet Take 600 mg by mouth daily as needed for cough.   . hydrocortisone ointment 0.5 % Apply 1 application  topically 2 (two) times daily.  Marland Kitchen levothyroxine (SYNTHROID, LEVOTHROID) 137 MCG tablet Take 1 tablet (137 mcg total) by mouth daily before breakfast.  . midodrine (PROAMATINE) 10 MG tablet TAKE 1 TABLET THREE TIMES DAILY. TAKE LAST DOSE AT LEAST 6 HOURS BEFORE BEDTIME.  . pantoprazole (PROTONIX) 40 MG tablet TAKE 1 TABLET BY MOUTH TWICE DAILY.  Marland Kitchen polyethylene glycol (MIRALAX / GLYCOLAX) packet Take 17 g by mouth 2 (two) times daily. Hold for loose stool  . pravastatin (PRAVACHOL) 40 MG tablet Take 1 tablet (40 mg total) by mouth at bedtime.  Marland Kitchen rOPINIRole (REQUIP) 0.5 MG tablet TAKE 1 TABLET 3 TIMES A DAY AS NEEDED.  Marland Kitchen sennosides-docusate sodium (SENOKOT-S) 8.6-50 MG tablet Take 2 tablets by mouth as needed for constipation.   . Sodium Chloride Flush (NORMAL SALINE FLUSH) 0.9 % SOLN Inject 10 mLs into the vein daily as needed.  . torsemide (DEMADEX) 10 MG tablet Take 10 mg by mouth every other day.  . traZODone (DESYREL) 50 MG tablet Take 1 tablet (50 mg total) by mouth at bedtime.  . white petrolatum (VASELINE) GEL Apply 1 application topically as needed (rectal irritation).  Marland Kitchen levothyroxine (SYNTHROID, LEVOTHROID) 137  MCG tablet TAKE 1 TABLET IN THE MORNING BEFORE BREAKFAST.  Marland Kitchen loperamide (IMODIUM A-D) 2 MG tablet Take 1 tablet (2 mg total) by mouth 4 (four) times daily as needed for diarrhea or loose stools.   No facility-administered encounter medications on file as of 06/19/2017.     Review of Systems  Constitutional: Negative for appetite change, chills, fatigue, fever and unexpected weight change.  HENT: Positive for hearing loss. Negative for congestion, rhinorrhea, sinus pressure, sinus pain, sneezing and sore throat.   Respiratory: Negative for chest tightness, shortness of breath and wheezing.        Chronic dry cough   Cardiovascular: Negative for chest pain, palpitations and leg swelling.  Gastrointestinal: Positive for diarrhea. Negative for abdominal distention, abdominal pain, blood in stool, nausea and vomiting.       Had x 4 loose stool yesterday and two this morning  Genitourinary:       Foley catheter   Musculoskeletal: Positive for gait problem.  Skin: Negative for color change, pallor and rash.       Sacral ulcer applies Desitin cream.    Psychiatric/Behavioral: Negative for agitation, confusion and sleep disturbance. The patient is not nervous/anxious.     Immunization History  Administered Date(s) Administered  . Influenza, High Dose Seasonal PF 11/01/2014, 11/13/2016  . Influenza-Unspecified 12/05/2013, 11/05/2014, 11/16/2015  . PPD Test 03/07/2014  . Pneumococcal Conjugate-13 10/05/2009  . Pneumococcal Polysaccharide-23 11/05/2005  . Pneumococcal-Unspecified 10/05/2009  . Tdap 08/02/2014  . Zoster 11/05/2005   Pertinent  Health Maintenance Due  Topic Date Due  . PNA vac Low Risk Adult (2 of 2 - PCV13) 10/06/2010  . INFLUENZA VACCINE  09/04/2017  . DEXA SCAN  12/14/2024   Fall Risk  10/02/2016 10/30/2015 09/05/2015 02/02/2015  Falls in the past year? No No No No  Risk for fall due to : - Impaired balance/gait - -    Vitals:   06/19/17 1104  BP: 136/78  Pulse: (!) 53   Resp: 18  Temp: (!) 97.5 F (36.4 C)  TempSrc: Oral  SpO2: 99%  Weight: 172 lb (78 kg)  Height: 5\' 10"  (1.778 m)   Body mass index is 24.68 kg/m. Physical Exam  Constitutional: Vital signs are normal.  Elderly in no acute distress   HENT:  Head:  Normocephalic.  Right Ear: External ear normal.  Left Ear: External ear normal.  Mouth/Throat: Oropharynx is clear and moist. No oropharyngeal exudate.  Eyes: Pupils are equal, round, and reactive to light. Conjunctivae and EOM are normal. Right eye exhibits no discharge. Left eye exhibits no discharge. No scleral icterus.  Eye glasses in place   Neck: Normal range of motion. No JVD present. No thyromegaly present.  Cardiovascular: Intact distal pulses. Exam reveals no gallop and no friction rub.  Murmur heard. Pulmonary/Chest: Effort normal and breath sounds normal. No stridor. No respiratory distress. He has no wheezes. He has no rales.  Abdominal: Soft. Bowel sounds are normal. He exhibits no distension and no mass. There is no tenderness. There is no rebound and no guarding.  Musculoskeletal: He exhibits no tenderness.  Unsteady gait uses Rolator.   Lymphadenopathy:    He has no cervical adenopathy.  Neurological: He is alert.  Skin: Skin is warm and dry. No rash noted. No erythema. No pallor.  Sacral scab scab area non-tender to touch and surrounding skin tissue without any signs of infections.   Psychiatric: He has a normal mood and affect. His behavior is normal. Thought content normal.    Labs reviewed: Recent Labs    01/16/17 1436 03/18/17 2113 04/07/17 2358 05/05/17 0000  NA 142 138 138 140  K 3.8 4.3 4.1 4.6  CL 103 104 104 103  CO2 27  --  26 32  GLUCOSE 105* 105* 97 90  BUN 13 10 15 10   CREATININE 0.91 0.70 0.93 0.80  CALCIUM 8.8  --  9.0 8.9   Recent Labs    08/15/16 01/01/17 0000 05/05/17 0000  AST 22 29 28   ALT 21 25 22   ALKPHOS 62  --   --   BILITOT  --  0.4 0.5  PROT  --  6.3 6.5   Recent Labs     07/31/16 1359  09/17/16 1517  01/16/17 1436 03/18/17 2026 03/18/17 2113 04/07/17 2358  WBC CANCELED   < > 6.1   < > 6.4 5.9  --  8.6  NEUTROABS CANCELED  --  4.0  --   --   --   --  6.2  HGB CANCELED   < > 13.4   < > 13.6 13.4 13.9 13.7  HCT CANCELED   < > 40.5   < > 40.3 40.4 41.0 41.1  MCV CANCELED   < > 98.6   < > 95 97.3  --  97.4  PLT CANCELED   < > 228.0   < > 261 224  --  230   < > = values in this interval not displayed.   Lab Results  Component Value Date   TSH 0.34 (L) 05/05/2017   Lab Results  Component Value Date   HGBA1C 5.6 12/07/2015   Lab Results  Component Value Date   CHOL 103 05/05/2017   HDL 39 (L) 05/05/2017   LDLCALC 43 05/05/2017   TRIG 129 05/05/2017   CHOLHDL 2.6 05/05/2017    Significant Diagnostic Results in last 30 days:  No results found.  Assessment/Plan 1. Diarrhea Afebrile.vital signs stable.Loose stool x 2 days.Had x 4 stool yesterday but two today.Abdomen non-distended,soft with x 4 quadrant bowel sounds. Has good appetite.unclear etiology.asymptomatic diarrhea could be self limiting.Encouraged to increase fluid intake and diet as tolareted.OTC imodium for loose stool.Notify provide if diarrhea not resolved in 3 days,worsen or running any fever.  2. Pressure injury of sacral region, unstageable  Sacral scab scab area non-tender to touch and surrounding skin tissue without any signs of infections.no drainage noted.Encourage to change position frequently.Drink boost one can by mouth once daily.   Family/ staff Communication: Reviewed plan of care with patient and wife.  Labs/tests ordered: None   Robie Mcniel C Jamelah Sitzer, NP

## 2017-07-08 ENCOUNTER — Other Ambulatory Visit: Payer: Self-pay | Admitting: Internal Medicine

## 2017-07-10 ENCOUNTER — Telehealth: Payer: Self-pay | Admitting: Physician Assistant

## 2017-07-10 ENCOUNTER — Encounter: Payer: Medicare Other | Admitting: Family

## 2017-07-10 NOTE — Telephone Encounter (Signed)
Spoke with [facility] pharmacy.  They (again) promised that the prescription would be shipped to patient today

## 2017-07-10 NOTE — Telephone Encounter (Signed)
New Message   Pt c/o medication issue:  1. Name of Medication: [name]   2. How are you currently taking this medication (dosage and times per day)?   3. Are you having a reaction (difficulty breathing--STAT)?   4. What is your medication issue? [facility] is calling on behalf of patient. She needs to confirm that the patient should be taken this medication. If so they will need a new Rx for this medication.

## 2017-07-15 ENCOUNTER — Telehealth: Payer: Self-pay | Admitting: Cardiovascular Disease

## 2017-07-15 NOTE — Telephone Encounter (Signed)
[name] should not be the cause of this.  Have him monitor and make sure the bruises go away in a timely manner and don't get larger.  We can do a CBC if they continue to appear over the next couple of weeks

## 2017-07-15 NOTE — Telephone Encounter (Signed)
Informed pt/wife of message, she states that at this point, that they have been getting smaller, they will keep an eye on them and call back should they increase in size or additional appear.

## 2017-07-15 NOTE — Telephone Encounter (Signed)
New Message:     Pt have bruises all over,under his eye, his face and chest and neck. Wonder if it might be from his blood thinner?Marland Kitchen

## 2017-07-15 NOTE — Telephone Encounter (Signed)
Agree, not expected from [place].

## 2017-07-15 NOTE — Telephone Encounter (Signed)
Returned call to pt/wife he states that he has multiple bruising spots, he has one on his eye (started about a quarter size and is getting smaller)several on his face. One on his ear, neck. Denies any pain or trauma to these areas. Pt states that he has been taking Plavix 'for years" and recently started [name]. Please advise

## 2017-07-16 ENCOUNTER — Other Ambulatory Visit: Payer: Self-pay

## 2017-07-16 ENCOUNTER — Emergency Department (HOSPITAL_COMMUNITY)
Admission: EM | Admit: 2017-07-16 | Discharge: 2017-07-16 | Disposition: A | Discharge status: Home / Self Care | Payer: Medicare Other | Attending: Emergency Medicine | Admitting: Emergency Medicine

## 2017-07-16 ENCOUNTER — Encounter (HOSPITAL_COMMUNITY): Payer: Self-pay | Admitting: Emergency Medicine

## 2017-07-16 DIAGNOSIS — Z955 Presence of coronary angioplasty implant and graft: Secondary | ICD-10-CM | POA: Diagnosis not present

## 2017-07-16 DIAGNOSIS — R102 Pelvic and perineal pain: Secondary | ICD-10-CM | POA: Insufficient documentation

## 2017-07-16 DIAGNOSIS — Z951 Presence of aortocoronary bypass graft: Secondary | ICD-10-CM | POA: Insufficient documentation

## 2017-07-16 DIAGNOSIS — I2510 Atherosclerotic heart disease of native coronary artery without angina pectoris: Secondary | ICD-10-CM | POA: Insufficient documentation

## 2017-07-16 DIAGNOSIS — E039 Hypothyroidism, unspecified: Secondary | ICD-10-CM | POA: Insufficient documentation

## 2017-07-16 DIAGNOSIS — I110 Hypertensive heart disease with heart failure: Secondary | ICD-10-CM | POA: Insufficient documentation

## 2017-07-16 DIAGNOSIS — I5040 Unspecified combined systolic (congestive) and diastolic (congestive) heart failure: Secondary | ICD-10-CM | POA: Diagnosis not present

## 2017-07-16 DIAGNOSIS — Z950 Presence of cardiac pacemaker: Secondary | ICD-10-CM | POA: Diagnosis not present

## 2017-07-16 LAB — I-STAT CHEM 8, ED
BUN: 9 mg/dL (ref 6–20)
Calcium, Ion: 1.17 mmol/L (ref 1.15–1.40)
Chloride: 100 mmol/L — ABNORMAL LOW (ref 101–111)
Creatinine, Ser: 0.8 mg/dL (ref 0.61–1.24)
Glucose, Bld: 98 mg/dL (ref 65–99)
HCT: 40 % (ref 39.0–52.0)
Hemoglobin: 13.6 g/dL (ref 13.0–17.0)
Potassium: 3.5 mmol/L (ref 3.5–5.1)
Sodium: 141 mmol/L (ref 135–145)
TCO2: 25 mmol/L (ref 22–32)

## 2017-07-16 LAB — URINALYSIS, ROUTINE W REFLEX MICROSCOPIC
Bilirubin Urine: NEGATIVE
Glucose, UA: NEGATIVE mg/dL
Ketones, ur: NEGATIVE mg/dL
Nitrite: NEGATIVE
Protein, ur: 100 mg/dL — AB
RBC / HPF: >50 RBC/hpf — ABNORMAL HIGH (ref 0–5)
Specific Gravity, Urine: 1.01 (ref 1.005–1.030)
WBC, UA: >50 WBC/hpf — ABNORMAL HIGH (ref 0–5)
pH: 5 (ref 5.0–8.0)

## 2017-07-16 MED ORDER — CEPHALEXIN 500 MG PO CAPS
500.0000 mg | ORAL_CAPSULE | Freq: Once | ORAL | Status: AC
  Administered 2017-07-16: 500 mg via ORAL
  Filled 2017-07-16: qty 1

## 2017-07-16 MED ORDER — ROPINIROLE HCL 0.5 MG PO TABS
0.5000 mg | ORAL_TABLET | Freq: Once | ORAL | Status: AC
  Administered 2017-07-16: 0.5 mg via ORAL
  Filled 2017-07-16: qty 1

## 2017-07-16 MED ORDER — CIPROFLOXACIN HCL 500 MG PO TABS: 500.0000 mg | ORAL_TABLET | Freq: Once | ORAL | Status: DC

## 2017-07-16 MED ORDER — CEPHALEXIN 500 MG PO CAPS: 500.0000 mg | ORAL_CAPSULE | Freq: Four times a day (QID) | ORAL | 0 refills | Status: DC

## 2017-07-16 NOTE — ED Triage Notes (Signed)
EMS report given to EDP, Dr. Dayna Barker. Per Dr. Abbott Pao c/o discomfort and foley blockage. Wife has been flushing foley at home. 2 blue flush caps found in foley tubing. Foley flushed by EDP.

## 2017-07-16 NOTE — ED Notes (Signed)
Bed: WA09 Expected date:  Expected time:  Means of arrival:  Comments: EMS -[place]

## 2017-07-16 NOTE — ED Provider Notes (Signed)
Emergency Department Provider Note   I have reviewed the triage vital signs and the nursing notes.   HISTORY  Chief Complaint foley issue   HPI Adam [name] is a 84 y.o. male with multiple medical problems documented below the presents to the emergency department today secondary to difficulty draining his Foley catheter.  Sounds like patient has indwelling Foley for multiple years secondary to neurogenic bladder of some sort.  He has had a changed on May 6.  His wife usually flushes every 2-3 nights.  Couple nights ago she got some new syringes and she was trying to flush and could not flush it.  She tried again last night and it did not flush.  She had decreased drainage into the bag and increasing pain around his urethra and in his suprapubic area so called EMS who brought him here for further evaluation.  When EMS arrived on evaluation of his Foley catheter he had 2 plastic caps lodged in the Foley catheter presumably from where his wife had tried to flush it and came off the syringes.  Has had some drainage on the way here.  Patient improved some on the pain side of things.  No fevers, nausea, vomiting, diarrhea or back pain. No other associated or modifying symptoms.    Past Medical History:  Diagnosis Date  . Arthritis    "minor, back and sometimes knees" (12/15/2012)  . Bradycardia    AFib/SSS s/p [employer] PPM 04/12/2008  . CAD (coronary artery disease) 12/30/2014   CABG (LIMA-LAD, SVG-RCA, SVG-OM in 1996).  07/2009 BMS to SVG-RCA. Cath in 04/2010 with patent stents   . Cardiomyopathy, ischemic 08/25/2012  . CHF (congestive heart failure) ([place])   . Chronic knee pain 12/03/2014  . Combined congestive systolic and diastolic heart failure ([place]) 02/02/2015   Hx EF 41%. BNP 96.8 02/21/15 Torsemide 04/06/15 Na 142, K 4.6, Bun 16, creat 0.89 04/20/14 BNP 111.7, Na 142, K 4.6, Bun 16, creat 0.9   . Depression with anxiety 02/02/2015   02/21/15 Hgb A1c 5.8 03/10/15 MMSE 30/30   . Dizziness,  after diuretic asscoiated with hypotension and responded to fluid bolus 06/05/2011   04/28/15 US carotid R+L normal bilateral arterial velocities.    Marland Kitchen Dyspnea 08/11/2014   Followed in Pulmonary clinic/ [facility]/ [place]  - 08/11/2014  Walked RA x 1 laps @ 185 ft each stopped due to fatigue/off balance/ slow pace/  no sob or desat  - PFT's  09/26/2014  FEV1 2.26 (85 % ) ratio 76  p no % improvement from saba with DLCO  67 % corrects to 93 % for alv volume      Since prev study 08/04/13 minimal change lung vol or dlco    . Embolic cerebral infarction ([place]) 12/06/2015  . Exertional shortness of breath    "sometimes walking" (12/15/2012)  . GERD (gastroesophageal reflux disease)   . Gout 02/09/2015  . Heart murmur    "just told I had one today" (12/15/2012)  . Hiatal hernia   . Hyperlipidemia   . Hypertension   . Hypothyroid   . Influenza A 03/10/2016  . Insomnia   . Melanoma of back ([place]) 1976  . Myocardial infarction [facility]) 1996; 2011   "both silent" (12/15/2012)  . Nonrheumatic aortic valve stenosis   . Orthostatic hypotension   . Osteoporosis, senile   . Pacemaker   . RBBB   . Restless leg 02/02/2015  . Right leg weakness 12/06/2015  . [employer] spotted  fever   . S/P CABG x 4   . Sick sinus syndrome ([place]) 01/31/2014  . Sustained ventricular tachycardia ([place]) 07/27/2014  . Urinary retention 12/30/2014    Patient Active Problem List   Diagnosis Date Noted  . Neurogenic orthostatic hypotension ([place]) 04/21/2017  . Decubitus ulcer of sacral region, stage 2 06/11/2016  . Osteoarthritis 04/23/2016  . Gingivitis 04/15/2016  . Weak 03/14/2016  . AKI (acute kidney injury) ([place]) 03/11/2016  . History of arterial ischemic stroke 03/05/2016  . Rash and nonspecific skin eruption 02/27/2016  . Nonrheumatic aortic valve stenosis   . TIA (transient ischemic attack) 01/10/2016  . Hx of CABG 01/10/2016  . Urinary tract infection 04/06/2015  . Insomnia 03/23/2015  . Gout 02/09/2015  .  Depression with anxiety 02/02/2015  . RLS (restless legs syndrome) 02/02/2015  . Combined congestive systolic and diastolic heart failure ([place]) 02/02/2015  . Urinary frequency 02/02/2015  . Pressure ulcer 01/01/2015  . Urinary retention 12/30/2014  . Coronary artery disease involving native coronary artery of native heart without angina pectoris 12/30/2014  . Expressive aphasia 12/30/2014  . Chronic constipation   . Chronic knee pain 12/03/2014  . GERD (gastroesophageal reflux disease)   . Dyspnea on exertion 08/11/2014  . Hypothyroidism 08/11/2014  . VT (ventricular tachycardia) ([place]) 07/27/2014  . Upper airway cough syndrome 07/14/2014  . SSS (sick sinus syndrome) ([place]) 01/31/2014  . Hyperlipidemia 01/30/2014  . Cardiomyopathy, ischemic 08/25/2012  . Pacemaker 08/25/2012  . Dizzy 06/05/2011  . Bradycardia     Past Surgical History:  Procedure Laterality Date  . CARDIAC CATHETERIZATION  04/2010   LIMA to LAD patent,SVG to OM patent,no in-stnet restenosis RCA  . CATARACT EXTRACTION W/ INTRAOCULAR LENS  IMPLANT, BILATERAL Bilateral 2012  . CORONARY ANGIOPLASTY WITH STENT PLACEMENT  07/2009   bare metal stent to SVG to the RCA  . CORONARY ARTERY BYPASS GRAFT  1996   LIMA to LAD,SVG to RCA & SVG to OM  . INSERT / REPLACE / REMOVE PACEMAKER  2010  . MELANOMA EXCISION  05/1974 X2   "taken off my back" (12/15/2012)  . NM MYOVIEW LTD  06/2011   low risk  . PPM GENERATOR CHANGEOUT N/A 01/22/2017   Procedure: PPM GENERATOR CHANGEOUT;  Surgeon: [name], MD;  Location: [facility];  Service: Cardiovascular;  Laterality: N/A;  . TONSILLECTOMY  1938  . TRANSURETHRAL RESECTION OF PROSTATE  1986  . US ECHOCARDIOGRAPHY  07/11/2009   EF 45-50%    Current Outpatient Rx  . Order #: [id number] Class: Historical Med  . Order #: [id number] Class: Normal  . Order #: [id number] Class: Normal  . Order #: [id number] Class: Historical Med  . Order #: [id number] Class: Normal  . Order #:  [id number] Class: Normal  . Order #: [id number] Class: Historical Med  . Order #: [id number] Class: Normal  . Order #: [id number] Class: Normal  . Order #: [id number] Class: Normal  . Order #: [id number] Class: Normal  . Order #: [id number] Class: Historical Med  . Order #: [id number] Class: Normal  . Order #: [id number] Class: Normal  . Order #: [id number] Class: Historical Med  . Order #: [id number] Class: Normal  . Order #: [id number] Class: Historical Med  . Order #: [id number] Class: Normal  . Order #: [id number] Class: Historical Med  . Order #: [id number] Class: Print    Allergies Altace [ramipril]; Crestor [rosuvastatin calcium]; Penicillins; and Sulfa antibiotics  Family History  Problem Relation Age of Onset  . Coronary artery disease Mother   . Diabetes Mother   . Heart disease Mother   .  Coronary artery disease Father   . Diabetes Father   . Lung cancer Father   . Heart disease Father   . Anuerysm Son   . Heart disease Brother     Social History Social History   Tobacco Use  . Smoking status: Never Smoker  . Smokeless tobacco: Never Used  Substance Use Topics  . Alcohol use: No    Alcohol/week: 0.0 oz  . Drug use: No    Review of Systems  All other systems negative except as documented in the HPI. All pertinent positives and negatives as reviewed in the HPI. ____________________________________________   PHYSICAL EXAM:  VITAL SIGNS: ED Triage Vitals [07/16/17 1759]  Enc Vitals Group     BP (!) 155/77     Pulse Rate 71     Resp 16     Temp 97.8 F (36.6 C)     Temp Source Oral     SpO2 96 %    Constitutional: Alert and oriented. Well appearing and in no acute distress. Eyes: Conjunctivae are normal. PERRL. EOMI. Head: Atraumatic. Nose: No congestion/rhinnorhea. Mouth/Throat: Mucous membranes are moist.  Oropharynx non-erythematous. Neck: No stridor.  No meningeal signs.   Cardiovascular: Normal rate, regular rhythm. Good peripheral circulation. Grossly normal heart  sounds.   Respiratory: Normal respiratory effort.  No retractions. Lungs CTAB. Gastrointestinal: Soft and nontender. No distention.  Musculoskeletal: No lower extremity tenderness nor edema. No gross deformities of extremities. Neurologic:  Normal speech and language. No gross focal neurologic deficits are appreciated.  Skin:  Skin is warm, dry and intact. No rash noted.   ____________________________________________   LABS (all labs ordered are listed, but only abnormal results are displayed)  Labs Reviewed  URINALYSIS, ROUTINE W REFLEX MICROSCOPIC - Abnormal; Notable for the following components:      Result Value   APPearance TURBID (*)    Hgb urine dipstick MODERATE (*)    Protein, ur 100 (*)    Leukocytes, UA LARGE (*)    RBC / HPF >50 (*)    WBC, UA >50 (*)    Bacteria, UA MANY (*)    All other components within normal limits  I-STAT CHEM 8, ED - Abnormal; Notable for the following components:   Chloride 100 (*)    All other components within normal limits  URINE CULTURE   ____________________________________________    INITIAL IMPRESSION / ASSESSMENT AND PLAN / ED COURSE  Flushed foley, seems to work. Very cloudy urine. Some ecchymosis and denuded skin around penis. Will bladder scan and check for UTI.  Letter scan with 300+ cc urine retained.  Nurse had difficulty removing Foley catheter so asked for me to assist.  On repositioning had brisk output of urine into his catheter bag and relatively immediate relief of symptoms.  His urine definitely looks infected we will add a culture on.  Still having some mild suprapubic tenderness suspect UTI.  We will start antibiotics and follow-up with his PCP/urologist.  No evidence of kidney dysfunction secondary to bladder outlet obstruction.  Pertinent labs & imaging results that were available during my care of the patient were reviewed by me and considered in my medical decision making (see chart for  details).  ____________________________________________  FINAL CLINICAL IMPRESSION(S) / ED DIAGNOSES  Final diagnoses:  Suprapubic pain     MEDICATIONS GIVEN DURING THIS VISIT:  Medications  rOPINIRole (REQUIP) tablet 0.5 mg (0.5 mg Oral Given 07/16/17 2110)  cephALEXin (KEFLEX) capsule 500 mg (500 mg Oral Given 07/16/17 2110)  NEW OUTPATIENT MEDICATIONS STARTED DURING THIS VISIT:  Discharge Medication List as of 07/16/2017  9:18 PM    START taking these medications   Details  cephALEXin (KEFLEX) 500 MG capsule Take 1 capsule (500 mg total) by mouth 4 (four) times daily., Starting Wed 07/16/2017, Print        Note:  This note was prepared with assistance of Dragon voice recognition software. Occasional wrong-word or sound-a-like substitutions may have occurred due to the inherent limitations of voice recognition software.   Merrily Pew, MD 07/16/17 2226

## 2017-07-19 LAB — URINE CULTURE: Culture: >=100000 — AB

## 2017-07-20 ENCOUNTER — Telehealth: Payer: Self-pay

## 2017-07-20 NOTE — Telephone Encounter (Signed)
Post ED Visit - Positive Culture Follow-up  Culture report reviewed by antimicrobial stewardship pharmacist:  []  Elenor Quinones, Pharm.D. []  Heide Guile, Pharm.D., BCPS AQ-ID []  Parks Neptune, Pharm.D., BCPS []  Alycia Rossetti, Pharm.D., BCPS []  [place], [place].D., BCPS, AAHIVP []  Legrand Como, Pharm.D., BCPS, AAHIVP [x]  Salome Arnt, PharmD, BCPS []  Wynell Balloon, PharmD []  Vincenza Hews, PharmD, BCPS  Positive urine culture Treated with Cephalexin, organism sensitive to the same and no further patient follow-up is required at this time.  Genia Del 07/20/2017, 11:46 AM

## 2017-07-21 ENCOUNTER — Non-Acute Institutional Stay: Payer: Medicare Other | Admitting: Internal Medicine

## 2017-07-21 ENCOUNTER — Encounter: Payer: Self-pay | Admitting: Internal Medicine

## 2017-07-21 VITALS — BP 152/86 | HR 72 | Temp 98.3°F | Resp 14 | Ht 70.0 in | Wt 172.8 lb

## 2017-07-21 DIAGNOSIS — G3184 Mild cognitive impairment, so stated: Secondary | ICD-10-CM | POA: Diagnosis not present

## 2017-07-21 DIAGNOSIS — R5381 Other malaise: Secondary | ICD-10-CM | POA: Diagnosis not present

## 2017-07-21 DIAGNOSIS — N319 Neuromuscular dysfunction of bladder, unspecified: Secondary | ICD-10-CM | POA: Diagnosis not present

## 2017-07-21 DIAGNOSIS — E039 Hypothyroidism, unspecified: Secondary | ICD-10-CM | POA: Diagnosis not present

## 2017-07-21 DIAGNOSIS — G903 Multi-system degeneration of the autonomic nervous system: Secondary | ICD-10-CM | POA: Diagnosis not present

## 2017-07-21 DIAGNOSIS — G2581 Restless legs syndrome: Secondary | ICD-10-CM

## 2017-07-21 DIAGNOSIS — I5042 Chronic combined systolic (congestive) and diastolic (congestive) heart failure: Secondary | ICD-10-CM | POA: Diagnosis not present

## 2017-07-21 DIAGNOSIS — K2100 Gastro-esophageal reflux disease with esophagitis, without bleeding: Secondary | ICD-10-CM

## 2017-07-21 DIAGNOSIS — K5909 Other constipation: Secondary | ICD-10-CM

## 2017-07-21 DIAGNOSIS — I2510 Atherosclerotic heart disease of native coronary artery without angina pectoris: Secondary | ICD-10-CM | POA: Diagnosis not present

## 2017-07-21 DIAGNOSIS — N3000 Acute cystitis without hematuria: Secondary | ICD-10-CM | POA: Diagnosis not present

## 2017-07-21 NOTE — Progress Notes (Addendum)
Location:  [facility] of Service:  Clinic (12) Provider:  Blanchie Serve, MD  Blanchie Serve, MD  Patient Care Team: Blanchie Serve, MD as PCP - General (Internal Medicine) Croitoru, Dani Gobble, MD as Attending Physician (Cardiology) Vevelyn Royals, MD as Consulting Physician (Ophthalmology) Franchot Gallo, MD as Consulting Physician (Urology) Tanda Rockers, MD as Consulting Physician (Pulmonary Disease) Allyn Kenner, MD (Dermatology) Deliah Goody, PA-C as Physician Assistant (Physician Assistant) Ngetich, Nelda Bucks, NP as Nurse Practitioner (Family Medicine)  Extended Emergency Contact Information Primary Emergency Contact: Tortorelli,[name] Address: [address]          [place] Phone: [phone] Mobile Phone: [phone] Relation: Spouse Secondary Emergency Contact: [name],Belsito  [place] Mobile Phone: [phone] Relation: Daughter  Goals of care: Advanced Directive information Advanced Directives 06/19/2017  Does Patient Have a Medical Advance Directive? Yes  Type of Paramedic of [place];Living will  Does patient want to make changes to medical advance directive? -  Copy of [place] in Chart? Yes  Would patient like information on creating a medical advance directive? -  Pre-existing out of facility DNR order (yellow form or pink MOST form) -     Chief Complaint  Patient presents with  . Acute Visit    ER follow up and discuss AL respite    HPI:  Pt is a 84 y.o. male seen today for an acute visit for follow up from recent ED visit. Patient currently resides in ALF. He visited ED on 07/16/17 due to trouble draining his foley catheter. He has foley catheter for neurogenic bladder. His catheter was flushed and repositioned in ED and worked well. Bladder scan initially showed 300 + cc urine and some injury to penile skin area. Urine sample was sent for  urinanalysis with culture and he was placed on antibiotic keflex 500 mg qid. On chart review, ED urine culture is positive for klebsiella oxytoca. Of note, patient has multiple co-morbidities and this has made him require increased assistance with his ADLs. His wife is his primary caregiver and they have home health services come in for few hours. Family would like for him to be transitioned to facility where higher level of care can be provided to assist him with his ADLs. Patient is here today with his wife and son in law.    Past Medical History:  Diagnosis Date  . Arthritis    "minor, back and sometimes knees" (12/15/2012)  . Bradycardia    AFib/SSS s/p St Jude PPM 04/12/2008  . CAD (coronary artery disease) 12/30/2014   CABG (LIMA-LAD, SVG-RCA, SVG-OM in 1996).  07/2009 BMS to SVG-RCA. Cath in 04/2010 with patent stents   . Cardiomyopathy, ischemic 08/25/2012  . CHF (congestive heart failure) ([place])   . Chronic knee pain 12/03/2014  . Combined congestive systolic and diastolic heart failure ([place]) 02/02/2015   Hx EF 41%. BNP 96.8 02/21/15 Torsemide 04/06/15 Na 142, K 4.6, Bun 16, creat 0.89 04/20/14 BNP 111.7, Na 142, K 4.6, Bun 16, creat 0.9   . Depression with anxiety 02/02/2015   02/21/15 Hgb A1c 5.8 03/10/15 MMSE 30/30   . Dizziness, after diuretic asscoiated with hypotension and responded to fluid bolus 06/05/2011   04/28/15 US carotid R+L normal bilateral arterial velocities.    Marland Kitchen Dyspnea 08/11/2014   Followed in Pulmonary clinic/ [facility]/ [facility]  - 08/11/2014  Walked RA x 1 laps @ 185 ft each stopped  due to fatigue/off balance/ slow pace/  no sob or desat  - PFT's  09/26/2014  FEV1 2.26 (85 % ) ratio 76  p no % improvement from saba with DLCO  67 % corrects to 93 % for alv volume      Since prev study 08/04/13 minimal change lung vol or dlco    . Embolic cerebral infarction ([place]) 12/06/2015  . Exertional shortness of breath    "sometimes walking" (12/15/2012)  . GERD (gastroesophageal reflux  disease)   . Gout 02/09/2015  . Heart murmur    "just told I had one today" (12/15/2012)  . Hiatal hernia   . Hyperlipidemia   . Hypertension   . Hypothyroid   . Influenza A 03/10/2016  . Insomnia   . Melanoma of back ([place]) 1976  . Myocardial infarction [facility]) 1996; 2011   "both silent" (12/15/2012)  . Nonrheumatic aortic valve stenosis   . Orthostatic hypotension   . Osteoporosis, senile   . Pacemaker   . RBBB   . Restless leg 02/02/2015  . Right leg weakness 12/06/2015  . [facility] spotted fever   . S/P CABG x 4   . Sick sinus syndrome ([place]) 01/31/2014  . Sustained ventricular tachycardia ([place]) 07/27/2014  . Urinary retention 12/30/2014   Past Surgical History:  Procedure Laterality Date  . CARDIAC CATHETERIZATION  04/2010   LIMA to LAD patent,SVG to OM patent,no in-stnet restenosis RCA  . CATARACT EXTRACTION W/ INTRAOCULAR LENS  IMPLANT, BILATERAL Bilateral 2012  . CORONARY ANGIOPLASTY WITH STENT PLACEMENT  07/2009   bare metal stent to SVG to the RCA  . CORONARY ARTERY BYPASS GRAFT  1996   LIMA to LAD,SVG to RCA & SVG to OM  . INSERT / REPLACE / REMOVE PACEMAKER  2010  . MELANOMA EXCISION  05/1974 X2   "taken off my back" (12/15/2012)  . NM MYOVIEW LTD  06/2011   low risk  . PPM GENERATOR CHANGEOUT N/A 01/22/2017   Procedure: PPM GENERATOR CHANGEOUT;  Surgeon: [name], MD;  Location: [facility];  Service: Cardiovascular;  Laterality: N/A;  . TONSILLECTOMY  1938  . TRANSURETHRAL RESECTION OF PROSTATE  1986  . US ECHOCARDIOGRAPHY  07/11/2009   EF 45-50%    Allergies  Allergen Reactions  . Altace [Ramipril] Cough  . Crestor [Rosuvastatin Calcium] Rash  . Penicillins Rash and Other (See Comments)    Has patient had a PCN reaction causing immediate rash, facial/tongue/throat swelling, SOB or lightheadedness with hypotension: Yes Has patient had a PCN reaction causing severe rash involving mucus membranes or skin necrosis: No Has patient had a PCN  reaction that required hospitalization No Has patient had a PCN reaction occurring within the last 10 years: No If all of the above answers are "NO", then may proceed with Cephalosporin use.   . Sulfa Antibiotics Rash    Outpatient Encounter Medications as of 07/21/2017  Medication Sig  . acetaminophen (TYLENOL) 325 MG tablet Take 650 mg by mouth as needed for moderate pain or headache.   Marland Kitchen amiodarone (PACERONE) 100 MG tablet Take 1 tablet (100 mg total) by mouth daily.  Marland Kitchen aspirin EC 81 MG tablet Take 1 tablet (81 mg total) by mouth daily.  . cephALEXin (KEFLEX) 500 MG capsule Take 1 capsule (500 mg total) by mouth 4 (four) times daily.  . Cholecalciferol (VITAMIN D3) 5000 UNITS CAPS Take 5,000 Units by mouth every Monday.   . clopidogrel (PLAVIX) 75 MG tablet TAKE 1 TABLET ONCE  DAILY.  . Droxidopa 100 MG CAPS Take 200 mg by mouth 3 (three) times daily.  Marland Kitchen guaiFENesin (MUCINEX) 600 MG 12 hr tablet Take 600 mg by mouth daily as needed for cough.   . hydrocortisone ointment 0.5 % Apply 1 application topically 2 (two) times daily.  Marland Kitchen levothyroxine (SYNTHROID, LEVOTHROID) 137 MCG tablet Take 1 tablet (137 mcg total) by mouth daily before breakfast.  . midodrine (PROAMATINE) 10 MG tablet TAKE 1 TABLET THREE TIMES DAILY. TAKE LAST DOSE AT LEAST 6 HOURS BEFORE BEDTIME.  . pantoprazole (PROTONIX) 40 MG tablet TAKE 1 TABLET BY MOUTH TWICE DAILY.  Marland Kitchen polyethylene glycol (MIRALAX / GLYCOLAX) packet Take 17 g by mouth daily as needed (if no bowel movement in 3 days). Hold for loose stool  . pravastatin (PRAVACHOL) 40 MG tablet Take 1 tablet (40 mg total) by mouth at bedtime.  Marland Kitchen rOPINIRole (REQUIP) 0.5 MG tablet TAKE 1 TABLET 3 TIMES A DAY AS NEEDED.  Marland Kitchen sennosides-docusate sodium (SENOKOT-S) 8.6-50 MG tablet Take 2-4 tablets by mouth as needed for constipation.   . Sodium Chloride Flush (NORMAL SALINE FLUSH) 0.9 % SOLN Inject 10 mLs into the vein daily as needed.  . torsemide (DEMADEX) 10 MG tablet Take 5  mg by mouth every other day.   . traZODone (DESYREL) 50 MG tablet Take 1 tablet (50 mg total) by mouth at bedtime.  . white petrolatum (VASELINE) GEL Apply 1 application topically as needed (rectal irritation).   No facility-administered encounter medications on file as of 07/21/2017.     Review of Systems  Constitutional: Positive for fatigue. Negative for appetite change, chills and fever.  Eyes: Positive for visual disturbance.       Wears corrective glasses  Respiratory: Positive for shortness of breath. Negative for cough.   Cardiovascular: Negative for chest pain and palpitations.  Gastrointestinal: Positive for constipation. Negative for abdominal pain, nausea and vomiting.  Genitourinary:       Has neurogenic bladder and indwelling foley catheter  Musculoskeletal: Positive for gait problem.  Skin: Negative for rash and wound.  Neurological: Negative for dizziness and headaches.  Psychiatric/Behavioral: Positive for confusion and decreased concentration. Negative for behavioral problems.    Immunization History  Administered Date(s) Administered  . Influenza, High Dose Seasonal PF 11/01/2014, 11/13/2016  . Influenza-Unspecified 12/05/2013, 11/05/2014, 11/16/2015  . PPD Test 03/07/2014  . Pneumococcal Conjugate-13 10/05/2009  . Pneumococcal Polysaccharide-23 11/05/2005  . Pneumococcal-Unspecified 10/05/2009  . Tdap 08/02/2014  . Zoster 11/05/2005   Pertinent  Health Maintenance Due  Topic Date Due  . PNA vac Low Risk Adult (2 of 2 - PCV13) 10/06/2010  . INFLUENZA VACCINE  09/04/2017  . DEXA SCAN  12/14/2024   Fall Risk  10/02/2016 10/30/2015 09/05/2015 02/02/2015  Falls in the past year? No No No No  Risk for fall due to : - Impaired balance/gait - -   Functional Status Survey:    Vitals:   07/21/17 1457  BP: (!) 152/86  Pulse: 72  Resp: 14  Temp: 98.3 F (36.8 C)  TempSrc: Oral  SpO2: 93%  Weight: 172 lb 12.8 oz (78.4 kg)  Height: 5\' 10"  (1.778 m)   Body  mass index is 24.79 kg/m.   Wt Readings from Last 3 Encounters:  07/21/17 172 lb 12.8 oz (78.4 kg)  06/19/17 172 lb (78 kg)  05/07/17 173 lb (78.5 kg)   Physical Exam  Constitutional: He is oriented to person, place, and time. He appears well-developed and well-nourished. No distress.  Frail, elderly male  HENT:  Head: Normocephalic and atraumatic.  Mouth/Throat: Oropharynx is clear and moist.  Eyes: Pupils are equal, round, and reactive to light. Conjunctivae and EOM are normal. Right eye exhibits no discharge. Left eye exhibits no discharge.  Neck: Normal range of motion. Neck supple.  Cardiovascular: Normal rate and regular rhythm.  Murmur heard. Pulmonary/Chest: Effort normal and breath sounds normal. He has no wheezes. He has no rales.  Abdominal: Soft. Bowel sounds are normal. There is no tenderness. No hernia.  Foley catheter in place with urine in bag and a leg wrap  Musculoskeletal:  Able to move all 4 extremities, needs 1 person assistance with transfer, unsteady gait, uses walker with seat for ambulation  Lymphadenopathy:    He has no cervical adenopathy.  Neurological: He is alert and oriented to person, place, and time. He exhibits normal muscle tone.  Skin: Skin is warm and dry. He is not diaphoretic.  Erythematous area to his buttock that is blanchable, scar from old sacral pressure ulcer  Psychiatric: He has a normal mood and affect.    Labs reviewed: Recent Labs    01/16/17 1436  04/07/17 2358 05/05/17 0000 07/16/17 1856  NA 142   < > 138 140 141  K 3.8   < > 4.1 4.6 3.5  CL 103   < > 104 103 100*  CO2 27  --  26 32  --   GLUCOSE 105*   < > 97 90 98  BUN 13   < > 15 10 9   CREATININE 0.91   < > 0.93 0.80 0.80  CALCIUM 8.8  --  9.0 8.9  --    < > = values in this interval not displayed.   Recent Labs    08/15/16 01/01/17 0000 05/05/17 0000  AST 22 29 28   ALT 21 25 22   ALKPHOS 62  --   --   BILITOT  --  0.4 0.5  PROT  --  6.3 6.5   Recent Labs      07/31/16 1359  09/17/16 1517  01/16/17 1436 03/18/17 2026 03/18/17 2113 04/07/17 2358 07/16/17 1856  WBC CANCELED   < > 6.1   < > 6.4 5.9  --  8.6  --   NEUTROABS CANCELED  --  4.0  --   --   --   --  6.2  --   HGB CANCELED   < > 13.4   < > 13.6 13.4 13.9 13.7 13.6  HCT CANCELED   < > 40.5   < > 40.3 40.4 41.0 41.1 40.0  MCV CANCELED   < > 98.6   < > 95 97.3  --  97.4  --   PLT CANCELED   < > 228.0   < > 261 224  --  230  --    < > = values in this interval not displayed.   Lab Results  Component Value Date   TSH 0.34 (L) 05/05/2017   Lab Results  Component Value Date   HGBA1C 5.6 12/07/2015   Lab Results  Component Value Date   CHOL 103 05/05/2017   HDL 39 (L) 05/05/2017   LDLCALC 43 05/05/2017   TRIG 129 05/05/2017   CHOLHDL 2.6 05/05/2017    Significant Diagnostic Results in last 30 days:  No results found.  Assessment/Plan  1. Chronic combined systolic and diastolic congestive heart failure (HCC) Continue torsemide 5 mg every other day.   2. Neurogenic orthostatic hypotension (  HCC) Continue droxidopa 200 mg tid, midodrine 10 mg tid  3. Coronary artery disease involving native coronary artery of native heart without angina pectoris Chest pain free. Continue aspirin ec 81 mg daily with pravastatin 40 mg daily  4. Gastroesophageal reflux disease with esophagitis Continue pantoprazole and monitor.   5. Chronic constipation Continue senokot s as needed with miralax as needed.   6. Hypothyroidism, unspecified type Continue levothyroxine, low TSH in 4/19. He has missed lab appointment. Will obtain next lab once he is admitted to SNF  7. RLS (restless legs syndrome) Continue with requip as needed  8. Neurogenic bladder Continue foley care, will obtain records from urology office on specific foley care instruction per son in law request  76. Mild cognitive impairment Supportive care, will obtain PT/OT consult while in SNF for deconditioning  10. Acute  cystitis without hematuria Continue and complete 7 days course of keflex  11. Physical deconditioning Will have patient work with PT/OT as tolerated to regain strength and restore function.  Fall precautions. Transfer and admit to SNF for possible long term care with increased need for assistance with ADL as bathing, dressing, toileting. Will need skin care and pressure ulcer prophylaxis. FL2 form filled out   Family/ staff Communication: reviewed care plan with patient, his wife and son in law  Labs/tests ordered:  FL2 form filled out. Pending instruction from urology office on foley care instruction.   Blanchie Serve, MD Internal Medicine [facility] [place], [place] [address] Cell Phone (Monday-Friday 8 am - 5 pm): [phone] On Call: [phone] and follow prompts after 5 pm and on weekends Office Phone: [phone] Office Fax: [phone]  Addendum:  Mr Gabrielson currently resides in independent living facility and needs higher level of care. I would recommend him to be transferred and admitted to skilled nursing facility.   Blanchie Serve, MD  [facility] [phone] (Monday-Friday 8 am - 5 pm) [phone] (afterhours)

## 2017-07-23 ENCOUNTER — Encounter: Payer: Self-pay | Admitting: Internal Medicine

## 2017-07-24 ENCOUNTER — Telehealth: Payer: Self-pay | Admitting: Cardiovascular Disease

## 2017-07-24 MED ORDER — DROXIDOPA 200 MG PO CAPS: 200.0000 mg | ORAL_CAPSULE | Freq: Three times a day (TID) | ORAL | 3 refills | Status: DC

## 2017-07-24 NOTE — Telephone Encounter (Signed)
Order clarification given to nurse  1. Patient to take [name] 200mg  TID as prescribed - last dose 6 hrs before bedtime.  2. Monitor BP; of BP elevated please call office for further instructions.

## 2017-07-24 NOTE — Telephone Encounter (Signed)
Routed to pharmacy

## 2017-07-24 NOTE — Telephone Encounter (Signed)
Mr Broyhill is at short term Rehab facility after complicated UTI. Wife calling to clarify [name] dose after receiving [name] 100mg  capsules from [facility].   Patient currently on [name] 100mg  TID with BP readings of:  Sitting  Standing 98/71  101/65 128/84  104/73 113/76  108/57  She is reluctant to increase [name] dose to 200mg  TID. I explained purpose to increase mediation to previously stable dose of 200mg  TID. We can decrease midodrine dose as needed if BP elevated.

## 2017-07-24 NOTE — Telephone Encounter (Signed)
New message    Patient's spouse calling to verify Northerra dosage. Please call

## 2017-07-24 NOTE — Telephone Encounter (Signed)
New Message    Pt c/o medication issue:  1. Name of Medication: Droxidopa ([place]) 200 MG CAPS  2. How are you currently taking this medication (dosage and times per day)?   3. Are you having a reaction (difficulty breathing--STAT)?   4. What is your medication issue? Crystal is calling from friends home to clarify the dosage of the [name] that the patient is to be taking. She indicates that she has an order for one thing but the patients wife is saying it should be something else. Please call.

## 2017-07-25 ENCOUNTER — Encounter: Payer: Self-pay | Admitting: Internal Medicine

## 2017-07-25 ENCOUNTER — Non-Acute Institutional Stay (SKILLED_NURSING_FACILITY): Payer: Medicare Other | Admitting: Internal Medicine

## 2017-07-25 DIAGNOSIS — I5042 Chronic combined systolic (congestive) and diastolic (congestive) heart failure: Secondary | ICD-10-CM | POA: Diagnosis not present

## 2017-07-25 DIAGNOSIS — G903 Multi-system degeneration of the autonomic nervous system: Secondary | ICD-10-CM | POA: Diagnosis not present

## 2017-07-25 DIAGNOSIS — I472 Ventricular tachycardia, unspecified: Secondary | ICD-10-CM

## 2017-07-25 DIAGNOSIS — R5381 Other malaise: Secondary | ICD-10-CM | POA: Diagnosis not present

## 2017-07-25 DIAGNOSIS — N319 Neuromuscular dysfunction of bladder, unspecified: Secondary | ICD-10-CM | POA: Diagnosis not present

## 2017-07-25 DIAGNOSIS — I2510 Atherosclerotic heart disease of native coronary artery without angina pectoris: Secondary | ICD-10-CM | POA: Diagnosis not present

## 2017-07-25 DIAGNOSIS — K2100 Gastro-esophageal reflux disease with esophagitis, without bleeding: Secondary | ICD-10-CM

## 2017-07-25 DIAGNOSIS — E039 Hypothyroidism, unspecified: Secondary | ICD-10-CM

## 2017-07-25 DIAGNOSIS — K5909 Other constipation: Secondary | ICD-10-CM | POA: Diagnosis not present

## 2017-07-25 DIAGNOSIS — G3184 Mild cognitive impairment, so stated: Secondary | ICD-10-CM

## 2017-07-25 NOTE — Progress Notes (Signed)
Provider:  Blanchie Serve MD  Location:  [facility] Room Number: 9 Place of Service:  SNF (31)  PCP: Blanchie Serve, MD Patient Care Team: Blanchie Serve, MD as PCP - General (Internal Medicine) Croitoru, Dani Gobble, MD as Attending Physician (Cardiology) Vevelyn Royals, MD as Consulting Physician (Ophthalmology) Franchot Gallo, MD as Consulting Physician (Urology) Tanda Rockers, MD as Consulting Physician (Pulmonary Disease) Allyn Kenner, MD (Dermatology) Deliah Goody, PA-C as Physician Assistant (Physician Assistant) Ngetich, Nelda Bucks, NP as Nurse Practitioner (Family Medicine)  Extended Emergency Contact Information Primary Emergency Contact: Straus,[name] Address: [address]          [place] Phone: [phone] Mobile Phone: [phone] Relation: Spouse Secondary Emergency Contact: [name],Chisum  [place] Mobile Phone: [phone] Relation: Daughter  Code Status: full code  Goals of Care: Advanced Directive information Advanced Directives 07/25/2017  Does Patient Have a Medical Advance Directive? Yes  Type of Paramedic of [place];Living will  Does patient want to make changes to medical advance directive? No - Patient declined  Copy of [place] in Chart? Yes  Would patient like information on creating a medical advance directive? -  Pre-existing out of facility DNR order (yellow form or pink MOST form) -     Chief Complaint  Patient presents with  . New Admit To SNF    New Admission Visit     HPI: Patient is a 84 y.o. male seen today for admission visit. He has been admitted to skilled nursing facility due to increased need for assistance with his ADLs. He was residing in independent living facility prior to this and his wife and home health care services were taking care of him. He has multiple medical co-morbidities with known history of  CAD, CHF, neurogenic orthostatic hypotension, neurogenic bladder with foley, chronic constipation among others. He was seen by me in clinic recently and FL2 was filled out. He is seen in his room today. He denies any acute health concern this am. He has been working with therapy team.   Past Medical History:  Diagnosis Date  . Arthritis    "minor, back and sometimes knees" (12/15/2012)  . Bradycardia    AFib/SSS s/p St Jude PPM 04/12/2008  . CAD (coronary artery disease) 12/30/2014   CABG (LIMA-LAD, SVG-RCA, SVG-OM in 1996).  07/2009 BMS to SVG-RCA. Cath in 04/2010 with patent stents   . Cardiomyopathy, ischemic 08/25/2012  . CHF (congestive heart failure) ([place])   . Chronic knee pain 12/03/2014  . Combined congestive systolic and diastolic heart failure ([place]) 02/02/2015   Hx EF 41%. BNP 96.8 02/21/15 Torsemide 04/06/15 Na 142, K 4.6, Bun 16, creat 0.89 04/20/14 BNP 111.7, Na 142, K 4.6, Bun 16, creat 0.9   . Depression with anxiety 02/02/2015   02/21/15 Hgb A1c 5.8 03/10/15 MMSE 30/30   . Dizziness, after diuretic asscoiated with hypotension and responded to fluid bolus 06/05/2011   04/28/15 US carotid R+L normal bilateral arterial velocities.    Marland Kitchen Dyspnea 08/11/2014   Followed in Pulmonary clinic/ [facility]/ [facility]  - 08/11/2014  Walked RA x 1 laps @ 185 ft each stopped due to fatigue/off balance/ slow pace/  no sob or desat  - PFT's  09/26/2014  FEV1 2.26 (85 % ) ratio 76  p no % improvement from saba with DLCO  67 % corrects to 93 % for alv volume      Since  prev study 08/04/13 minimal change lung vol or dlco    . Embolic cerebral infarction ([place]) 12/06/2015  . Exertional shortness of breath    "sometimes walking" (12/15/2012)  . GERD (gastroesophageal reflux disease)   . Gout 02/09/2015  . Heart murmur    "just told I had one today" (12/15/2012)  . Hiatal hernia   . Hyperlipidemia   . Hypertension   . Hypothyroid   . Influenza A 03/10/2016  . Insomnia   . Melanoma of back ([place]) 1976  .  Myocardial infarction [facility]) 1996; 2011   "both silent" (12/15/2012)  . Nonrheumatic aortic valve stenosis   . Orthostatic hypotension   . Osteoporosis, senile   . Pacemaker   . RBBB   . Restless leg 02/02/2015  . Right leg weakness 12/06/2015  . [facility] spotted fever   . S/P CABG x 4   . Sick sinus syndrome ([place]) 01/31/2014  . Sustained ventricular tachycardia ([place]) 07/27/2014  . Urinary retention 12/30/2014   Past Surgical History:  Procedure Laterality Date  . CARDIAC CATHETERIZATION  04/2010   LIMA to LAD patent,SVG to OM patent,no in-stnet restenosis RCA  . CATARACT EXTRACTION W/ INTRAOCULAR LENS  IMPLANT, BILATERAL Bilateral 2012  . CORONARY ANGIOPLASTY WITH STENT PLACEMENT  07/2009   bare metal stent to SVG to the RCA  . CORONARY ARTERY BYPASS GRAFT  1996   LIMA to LAD,SVG to RCA & SVG to OM  . INSERT / REPLACE / REMOVE PACEMAKER  2010  . MELANOMA EXCISION  05/1974 X2   "taken off my back" (12/15/2012)  . NM MYOVIEW LTD  06/2011   low risk  . PPM GENERATOR CHANGEOUT N/A 01/22/2017   Procedure: PPM GENERATOR CHANGEOUT;  Surgeon: [name], MD;  Location: [facility];  Service: Cardiovascular;  Laterality: N/A;  . TONSILLECTOMY  1938  . TRANSURETHRAL RESECTION OF PROSTATE  1986  . US ECHOCARDIOGRAPHY  07/11/2009   EF 45-50%    reports that he has never smoked. He has never used smokeless tobacco. He reports that he does not drink alcohol or use drugs. Social History   Socioeconomic History  . Marital status: Married    Spouse name: Not on file  . Number of children: 2  . Years of education: Masters  . Highest education level: Not on file  Occupational History  . Occupation: Retired Company secretary -Pensions consultant  Social Needs  . Financial resource strain: Not on file  . Food insecurity:    Worry: Not on file    Inability: Not on file  . Transportation needs:    Medical: Not on file    Non-medical: Not on file  Tobacco Use  . Smoking status:  Never Smoker  . Smokeless tobacco: Never Used  Substance and Sexual Activity  . Alcohol use: No    Alcohol/week: 0.0 oz  . Drug use: No  . Sexual activity: Never  Lifestyle  . Physical activity:    Days per week: Not on file    Minutes per session: Not on file  . Stress: Not on file  Relationships  . Social connections:    Talks on phone: Not on file    Gets together: Not on file    Attends religious service: Not on file    Active member of club or organization: Not on file    Attends meetings of clubs or organizations: Not on file    Relationship status: Not on file  . Intimate partner violence:  Fear of current or ex partner: Not on file    Emotionally abused: Not on file    Physically abused: Not on file    Forced sexual activity: Not on file  Other Topics Concern  . Not on file  Social History Narrative   [facility]ives at [facility]   Married - Violet   Never smoked   Alcohol none   Previously employed as Scientist, forensic for [employer].         Diet:Low sodium   Do you drink/eat things with caffeine? No   Marital status: Married                              What year were you married?1950   Do you live in a house, apartment, assisted living, condo, trailer, etc)?    Is it one or more stories? 1   How many persons live in your home? 2   Do you have any pets in your home? No   Current or past profession: Minister, [employer]   Do you exercise?     Very Little                                                 Type & how often:    Do you have a living will?  Yes   Do you have a DNR Form? Yes   Do you have a POA/HPOA forms? Yes    Functional Status Survey:    Family History  Problem Relation Age of Onset  . Coronary artery disease Mother   . Diabetes Mother   . Heart disease Mother   . Coronary artery disease Father   . Diabetes Father   . Lung cancer Father   . Heart disease Father   . Anuerysm Son   . Heart disease  Brother     Health Maintenance  Topic Date Due  . PNA vac Low Risk Adult (2 of 2 - PCV13) 10/06/2010  . TETANUS/TDAP  08/01/2026 (Originally 08/01/2024)  . INFLUENZA VACCINE  09/04/2017  . DEXA SCAN  12/14/2024    Allergies  Allergen Reactions  . Altace [Ramipril] Cough  . Crestor [Rosuvastatin Calcium] Rash  . Penicillins Rash and Other (See Comments)    Has patient had a PCN reaction causing immediate rash, facial/tongue/throat swelling, SOB or lightheadedness with hypotension: Yes Has patient had a PCN reaction causing severe rash involving mucus membranes or skin necrosis: No Has patient had a PCN reaction that required hospitalization No Has patient had a PCN reaction occurring within the last 10 years: No If all of the above answers are "NO", then may proceed with Cephalosporin use.   . Sulfa Antibiotics Rash    Outpatient Encounter Medications as of 07/25/2017  Medication Sig  . acetaminophen (TYLENOL) 325 MG tablet Take 650 mg by mouth every 8 (eight) hours as needed for mild pain, moderate pain or headache.   Marland Kitchen amiodarone (PACERONE) 100 MG tablet Take 1 tablet (100 mg total) by mouth daily.  Marland Kitchen aspirin EC 81 MG tablet Take 1 tablet (81 mg total) by mouth daily.  . Cholecalciferol (VITAMIN D3) 5000 UNITS CAPS Take 5,000 Units by mouth every Monday.   . clopidogrel (PLAVIX) 75 MG tablet TAKE 1 TABLET ONCE DAILY.  Marland Kitchen  Droxidopa ([name]) 200 MG CAPS Take 200 mg by mouth 3 (three) times daily.  Marland Kitchen guaiFENesin (MUCINEX) 600 MG 12 hr tablet Take 600 mg by mouth daily as needed for cough.   . levothyroxine (SYNTHROID, LEVOTHROID) 137 MCG tablet Take 1 tablet (137 mcg total) by mouth daily before breakfast.  . midodrine (PROAMATINE) 10 MG tablet TAKE 1 TABLET THREE TIMES DAILY. TAKE LAST DOSE AT LEAST 6 HOURS BEFORE BEDTIME.  . pantoprazole (PROTONIX) 40 MG tablet TAKE 1 TABLET BY MOUTH TWICE DAILY.  Marland Kitchen polyethylene glycol (MIRALAX / GLYCOLAX) packet Take 17 g by mouth daily as needed  (if no bowel movement in 3 days). Hold for loose stool  . pravastatin (PRAVACHOL) 40 MG tablet Take 1 tablet (40 mg total) by mouth at bedtime.  Marland Kitchen rOPINIRole (REQUIP) 0.5 MG tablet TAKE 1 TABLET 3 TIMES A DAY AS NEEDED.  Marland Kitchen sennosides-docusate sodium (SENOKOT-S) 8.6-50 MG tablet Take 2 tablets by mouth at bedtime as needed for constipation.   . sodium chloride flush 0.9 % SOLN injection Inject 100 mLs into the vein daily.  Marland Kitchen torsemide (DEMADEX) 10 MG tablet Take 5 mg by mouth every other day.   . traZODone (DESYREL) 50 MG tablet Take 1 tablet (50 mg total) by mouth at bedtime.  . [DISCONTINUED] cephALEXin (KEFLEX) 500 MG capsule Take 1 capsule (500 mg total) by mouth 4 (four) times daily.  . [DISCONTINUED] hydrocortisone ointment 0.5 % Apply 1 application topically 2 (two) times daily.  . [DISCONTINUED] Sodium Chloride Flush (NORMAL SALINE FLUSH) 0.9 % SOLN Inject 10 mLs into the vein daily as needed.  . [DISCONTINUED] white petrolatum (VASELINE) GEL Apply 1 application topically as needed (rectal irritation).   No facility-administered encounter medications on file as of 07/25/2017.     Review of Systems  Constitutional: Positive for fatigue. Negative for appetite change, chills and fever.  HENT: Negative for congestion, ear discharge, ear pain, mouth sores, nosebleeds, postnasal drip, rhinorrhea, sore throat and trouble swallowing.   Respiratory: Positive for shortness of breath. Negative for cough and wheezing.   Cardiovascular: Positive for leg swelling. Negative for chest pain and palpitations.  Gastrointestinal: Positive for constipation. Negative for abdominal pain, blood in stool, diarrhea, nausea and vomiting.       Had a bowel movement 2 days back  Genitourinary: Negative for flank pain.       Has indwelling foley catheter  Musculoskeletal: Positive for gait problem. Negative for arthralgias and back pain.  Neurological: Positive for light-headedness. Negative for dizziness and  headaches.       Has restless leg  Psychiatric/Behavioral: Positive for decreased concentration. Negative for behavioral problems, confusion and sleep disturbance.    Vitals:   07/25/17 1045  BP: 115/68  Pulse: 71  Resp: 20  Temp: (!) 97 F (36.1 C)  TempSrc: Oral  SpO2: 96%  Weight: 172 lb (78 kg)  Height: 5\' 10"  (1.778 m)   Body mass index is 24.68 kg/m.   Wt Readings from Last 3 Encounters:  07/25/17 172 lb (78 kg)  07/21/17 172 lb 12.8 oz (78.4 kg)  06/19/17 172 lb (78 kg)   Physical Exam  Constitutional: He is oriented to person, place, and time. He appears well-developed and well-nourished. No distress.  HENT:  Head: Normocephalic and atraumatic.  Right Ear: External ear normal.  Left Ear: External ear normal.  Nose: Nose normal.  Mouth/Throat: Oropharynx is clear and moist. No oropharyngeal exudate.  Eyes: Pupils are equal, round, and reactive to light. Conjunctivae  and EOM are normal. Right eye exhibits no discharge. Left eye exhibits no discharge.  Neck: Normal range of motion. Neck supple.  Cardiovascular: Normal rate and regular rhythm.  Murmur heard. Pulmonary/Chest: Effort normal. No respiratory distress. He has no wheezes. He has rales.  Abdominal: Soft. Bowel sounds are normal. There is no tenderness.  Genitourinary:  Genitourinary Comments: Foley catheter in place with clean urine on foley bag  Musculoskeletal:  Able to move all 4 extremities, needs assistance with transfer, on walker with seat for ambulation, unsteady gait, 1+ leg edema  Lymphadenopathy:    He has no cervical adenopathy.  Neurological: He is alert and oriented to person, place, and time. He exhibits normal muscle tone.  Skin: Skin is warm and dry. He is not diaphoretic.  Erythematous blanchable area to buttock  Psychiatric: He has a normal mood and affect. His behavior is normal.    Labs reviewed: Basic Metabolic Panel: Recent Labs    01/16/17 1436  04/07/17 2358 05/05/17 0000  07/16/17 1856  NA 142   < > 138 140 141  K 3.8   < > 4.1 4.6 3.5  CL 103   < > 104 103 100*  CO2 27  --  26 32  --   GLUCOSE 105*   < > 97 90 98  BUN 13   < > 15 10 9   CREATININE 0.91   < > 0.93 0.80 0.80  CALCIUM 8.8  --  9.0 8.9  --    < > = values in this interval not displayed.   Liver Function Tests: Recent Labs    08/15/16 01/01/17 0000 05/05/17 0000  AST 22 29 28   ALT 21 25 22   ALKPHOS 62  --   --   BILITOT  --  0.4 0.5  PROT  --  6.3 6.5   No results for input(s): LIPASE, AMYLASE in the last 8760 hours. No results for input(s): AMMONIA in the last 8760 hours. CBC: Recent Labs    07/31/16 1359  09/17/16 1517  01/16/17 1436 03/18/17 2026 03/18/17 2113 04/07/17 2358 07/16/17 1856  WBC CANCELED   < > 6.1   < > 6.4 5.9  --  8.6  --   NEUTROABS CANCELED  --  4.0  --   --   --   --  6.2  --   HGB CANCELED   < > 13.4   < > 13.6 13.4 13.9 13.7 13.6  HCT CANCELED   < > 40.5   < > 40.3 40.4 41.0 41.1 40.0  MCV CANCELED   < > 98.6   < > 95 97.3  --  97.4  --   PLT CANCELED   < > 228.0   < > 261 224  --  230  --    < > = values in this interval not displayed.   Cardiac Enzymes: No results for input(s): CKTOTAL, CKMB, CKMBINDEX, TROPONINI in the last 8760 hours. BNP: Invalid input(s): POCBNP Lab Results  Component Value Date   HGBA1C 5.6 12/07/2015   Lab Results  Component Value Date   TSH 0.34 (L) 05/05/2017   No results found for: VITAMINB12 No results found for: FOLATE Lab Results  Component Value Date   FERRITIN 88 10/11/2016    Imaging and Procedures obtained prior to SNF admission: No results found.  Assessment/Plan  1. Neurogenic orthostatic hypotension (HCC) Continue midodrine 10 mg tid and droxidopa 200 mg tid.   2. Chronic combined systolic and  diastolic congestive heart failure (HCC) Continue torsemide qod, check weight 3 days a week for 2 weeks, then twice a week for a week and then weekly. Dry weight about 170-172 lbs  3. Coronary artery  disease involving native coronary artery of native heart without angina pectoris Continue aspirin and statin, not on b blocker or ACEI with hypotension  4. Chronic constipation Continue current bowel regimen, monitor for loose stool  5. Gastroesophageal reflux disease with esophagitis Continue pantoprazole  6. Hypothyroidism, unspecified type Reviewed recent TSH, suppressed.check TSH and free T4 next lab. Continue levothyroxine for now   7. Mild cognitive impairment Supportive care, redirection helps, supportive family  8. Neurogenic bladder Perineal care, zinc oxide to buttock area to prevent skin breakdown. Continue foley catheter and foley care. Completed antibiotic for UTI. Maintain hydration  9. Physical deconditioning Will have him work with physical therapy and occupational therapy team to help with gait training and muscle strengthening exercises.fall precautions. Skin care. Encourage to be out of bed.   10. Ventricular tachycardia Followed by cardiology. No recent episode. Continue amiodarone 100 mg daily. Check thyroid function.    Family/ staff Communication: reviewed care plan with patient and charge nurse.    Labs/tests ordered: TSH, free T4, weight monitor  Blanchie Serve, MD Internal Medicine [facility] [place] [facility] [address] [place], [place] [address] Cell Phone (Monday-Friday 8 am - 5 pm): [phone] On Call: [phone] and follow prompts after 5 pm and on weekends Office Phone: [phone] Office Fax: [phone]

## 2017-07-28 LAB — TSH: TSH: 0.31 — AB (ref 0.41–5.90)

## 2017-07-30 ENCOUNTER — Telehealth: Payer: Self-pay

## 2017-07-30 ENCOUNTER — Encounter: Payer: Self-pay | Admitting: Internal Medicine

## 2017-07-30 ENCOUNTER — Encounter: Payer: Medicare Other | Admitting: *Deleted

## 2017-07-30 ENCOUNTER — Non-Acute Institutional Stay (SKILLED_NURSING_FACILITY): Payer: Medicare Other | Admitting: Internal Medicine

## 2017-07-30 DIAGNOSIS — K5901 Slow transit constipation: Secondary | ICD-10-CM

## 2017-07-30 DIAGNOSIS — E039 Hypothyroidism, unspecified: Secondary | ICD-10-CM | POA: Diagnosis not present

## 2017-07-30 LAB — T4, FREE: Free T4: 1.5

## 2017-07-30 NOTE — Progress Notes (Signed)
Location:  [facility] Room Number: 9 Place of Service:  SNF ([phone]) Provider:  Blanchie Serve, MD  Blanchie Serve, MD  Patient Care Team: Blanchie Serve, MD as PCP - General (Internal Medicine) Croitoru, Dani Gobble, MD as Attending Physician (Cardiology) Vevelyn Royals, MD as Consulting Physician (Ophthalmology) Franchot Gallo, MD as Consulting Physician (Urology) Tanda Rockers, MD as Consulting Physician (Pulmonary Disease) Allyn Kenner, MD (Dermatology) Deliah Goody, PA-C as Physician Assistant (Physician Assistant) Ngetich, Nelda Bucks, NP as Nurse Practitioner (Family Medicine)  Extended Emergency Contact Information Primary Emergency Contact: Mester,[name] Address: [address]          [place] Phone: [phone] Mobile Phone: [phone] Relation: Spouse Secondary Emergency Contact: [name],Goupil  [place] Mobile Phone: [phone] Relation: Daughter   Goals of care: Advanced Directive information Advanced Directives 07/30/2017  Does Patient Have a Medical Advance Directive? Yes  Type of Paramedic of [place];Living will  Does patient want to make changes to medical advance directive? No - Patient declined  Copy of [place] in Chart? Yes  Would patient like information on creating a medical advance directive? -  Pre-existing out of facility DNR order (yellow form or pink MOST form) -     Chief Complaint  Patient presents with  . Acute Visit    Abnormal TSH     HPI:  Pt is a 84 y.o. male seen today for an acute visit for abnormal lab. He had recent lab work for [facility] and free T4 given low TSH in 4/19. He is on amiodarone. He also complaints of fatigue and constipation- not completely new symptom but ongoing. He is seen in his room today.    Past Medical History:  Diagnosis Date  . Arthritis    "minor, back and sometimes knees" (12/15/2012)  .  Bradycardia    AFib/SSS s/p St Jude PPM 04/12/2008  . CAD (coronary artery disease) 12/30/2014   CABG (LIMA-LAD, SVG-RCA, SVG-OM in 1996).  07/2009 BMS to SVG-RCA. Cath in 04/2010 with patent stents   . Cardiomyopathy, ischemic 08/25/2012  . CHF (congestive heart failure) ([place])   . Chronic knee pain 12/03/2014  . Combined congestive systolic and diastolic heart failure ([place]) 02/02/2015   Hx EF 41%. BNP 96.8 02/21/15 Torsemide 04/06/15 Na 142, K 4.6, Bun 16, creat 0.89 04/20/14 BNP 111.7, Na 142, K 4.6, Bun 16, creat 0.9   . Depression with anxiety 02/02/2015   02/21/15 Hgb A1c 5.8 03/10/15 MMSE 30/30   . Dizziness, after diuretic asscoiated with hypotension and responded to fluid bolus 06/05/2011   04/28/15 US carotid R+L normal bilateral arterial velocities.    Marland Kitchen Dyspnea 08/11/2014   Followed in Pulmonary clinic/ [facility]/ [place]  - 08/11/2014  Walked RA x 1 laps @ 185 ft each stopped due to fatigue/off balance/ slow pace/  no sob or desat  - PFT's  09/26/2014  FEV1 2.26 (85 % ) ratio 76  p no % improvement from saba with DLCO  67 % corrects to 93 % for alv volume      Since prev study 08/04/13 minimal change lung vol or dlco    . Embolic cerebral infarction ([place]) 12/06/2015  . Exertional shortness of breath    "sometimes walking" (12/15/2012)  . GERD (gastroesophageal reflux disease)   . Gout 02/09/2015  . Heart murmur    "just told I had one today" (12/15/2012)  . Hiatal hernia   .  Hyperlipidemia   . Hypertension   . Hypothyroid   . Influenza A 03/10/2016  . Insomnia   . Melanoma of back ([place]) 1976  . Myocardial infarction [facility]) 1996; 2011   "both silent" (12/15/2012)  . Nonrheumatic aortic valve stenosis   . Orthostatic hypotension   . Osteoporosis, senile   . Pacemaker   . RBBB   . Restless leg 02/02/2015  . Right leg weakness 12/06/2015  . [facility] spotted fever   . S/P CABG x 4   . Sick sinus syndrome ([place]) 01/31/2014  . Sustained ventricular tachycardia ([place]) 07/27/2014  .  Urinary retention 12/30/2014   Past Surgical History:  Procedure Laterality Date  . CARDIAC CATHETERIZATION  04/2010   LIMA to LAD patent,SVG to OM patent,no in-stnet restenosis RCA  . CATARACT EXTRACTION W/ INTRAOCULAR LENS  IMPLANT, BILATERAL Bilateral 2012  . CORONARY ANGIOPLASTY WITH STENT PLACEMENT  07/2009   bare metal stent to SVG to the RCA  . CORONARY ARTERY BYPASS GRAFT  1996   LIMA to LAD,SVG to RCA & SVG to OM  . INSERT / REPLACE / REMOVE PACEMAKER  2010  . MELANOMA EXCISION  05/1974 X2   "taken off my back" (12/15/2012)  . NM MYOVIEW LTD  06/2011   low risk  . PPM GENERATOR CHANGEOUT N/A 01/22/2017   Procedure: PPM GENERATOR CHANGEOUT;  Surgeon: [name], MD;  Location: [facility] CV LAB;  Service: Cardiovascular;  Laterality: N/A;  . TONSILLECTOMY  1938  . TRANSURETHRAL RESECTION OF PROSTATE  1986  . US ECHOCARDIOGRAPHY  07/11/2009   EF 45-50%    Allergies  Allergen Reactions  . Altace [Ramipril] Cough  . Crestor [Rosuvastatin Calcium] Rash  . Penicillins Rash and Other (See Comments)    Has patient had a PCN reaction causing immediate rash, facial/tongue/throat swelling, SOB or lightheadedness with hypotension: Yes Has patient had a PCN reaction causing severe rash involving mucus membranes or skin necrosis: No Has patient had a PCN reaction that required hospitalization No Has patient had a PCN reaction occurring within the last 10 years: No If all of the above answers are "NO", then may proceed with Cephalosporin use.   . Sulfa Antibiotics Rash    Outpatient Encounter Medications as of 07/30/2017  Medication Sig  . acetaminophen (TYLENOL) 325 MG tablet Take 650 mg by mouth every 8 (eight) hours as needed for mild pain, moderate pain or headache.   Marland Kitchen amiodarone (PACERONE) 100 MG tablet Take 1 tablet (100 mg total) by mouth daily.  Marland Kitchen aspirin EC 81 MG tablet Take 1 tablet (81 mg total) by mouth daily.  . bisacodyl (DULCOLAX) 10 MG suppository Place 10 mg  rectally as needed for moderate constipation.  . Cholecalciferol (VITAMIN D3) 5000 UNITS CAPS Take 5,000 Units by mouth every Monday.   . clopidogrel (PLAVIX) 75 MG tablet TAKE 1 TABLET ONCE DAILY.  Marland Kitchen Droxidopa ([name]) 200 MG CAPS Take 200 mg by mouth 3 (three) times daily.  Marland Kitchen guaiFENesin (MUCINEX) 600 MG 12 hr tablet Take 600 mg by mouth daily as needed for cough.   . levothyroxine (SYNTHROID, LEVOTHROID) 100 MCG tablet Take 100 mcg by mouth daily before breakfast.  . magnesium hydroxide (MILK OF MAGNESIA) 400 MG/5ML suspension Take 30 mLs by mouth as needed for mild constipation.  . midodrine (PROAMATINE) 10 MG tablet TAKE 1 TABLET THREE TIMES DAILY. TAKE LAST DOSE AT LEAST 6 HOURS BEFORE BEDTIME.  . pantoprazole (PROTONIX) 40 MG tablet TAKE 1 TABLET BY MOUTH TWICE  DAILY.  Marland Kitchen polyethylene glycol (MIRALAX / GLYCOLAX) packet Take 17 g by mouth daily as needed (if no bowel movement in 3 days). Hold for loose stool  . pravastatin (PRAVACHOL) 40 MG tablet Take 1 tablet (40 mg total) by mouth at bedtime.  Marland Kitchen rOPINIRole (REQUIP) 0.5 MG tablet TAKE 1 TABLET 3 TIMES A DAY AS NEEDED.  Marland Kitchen sennosides-docusate sodium (SENOKOT-S) 8.6-50 MG tablet Take 2 tablets by mouth at bedtime.   . Sodium Chloride Flush (NORMAL SALINE FLUSH) 0.9 % SOLN 100 mLs daily. Flush foley catheter in the evening and as needed.  . torsemide (DEMADEX) 10 MG tablet Take 5 mg by mouth every other day.   . traZODone (DESYREL) 50 MG tablet Take 1 tablet (50 mg total) by mouth at bedtime.  . [DISCONTINUED] sodium chloride flush 0.9 % SOLN injection Inject 100 mLs into the vein daily.  . [DISCONTINUED] levothyroxine (SYNTHROID, LEVOTHROID) 137 MCG tablet Take 1 tablet (137 mcg total) by mouth daily before breakfast.   No facility-administered encounter medications on file as of 07/30/2017.     Review of Systems  Constitutional: Negative for appetite change, chills and fever.  HENT: Negative for congestion.   Respiratory: Positive for  shortness of breath.   Cardiovascular: Positive for leg swelling. Negative for chest pain and palpitations.  Gastrointestinal: Positive for constipation. Negative for abdominal pain, diarrhea, nausea and vomiting.  Musculoskeletal: Positive for gait problem.  Psychiatric/Behavioral: Positive for confusion and decreased concentration.    Immunization History  Administered Date(s) Administered  . Influenza, High Dose Seasonal PF 11/01/2014, 11/13/2016  . Influenza-Unspecified 12/05/2013, 11/05/2014, 11/16/2015  . PPD Test 03/07/2014  . Pneumococcal Conjugate-13 10/05/2009  . Pneumococcal Polysaccharide-23 11/05/2005  . Pneumococcal-Unspecified 10/05/2009  . Tdap 08/02/2014  . Zoster 11/05/2005   Pertinent  Health Maintenance Due  Topic Date Due  . PNA vac Low Risk Adult (2 of 2 - PCV13) 10/06/2010  . INFLUENZA VACCINE  09/04/2017  . DEXA SCAN  12/14/2024   Fall Risk  10/02/2016 10/30/2015 09/05/2015 02/02/2015  Falls in the past year? No No No No  Risk for fall due to : - Impaired balance/gait - -   Functional Status Survey:    Vitals:   07/30/17 1452  BP: 136/85  Pulse: 70  Resp: 20  Temp: (!) 97.1 F (36.2 C)  TempSrc: Oral  SpO2: 97%  Weight: 172 lb (78 kg)  Height: 5\' 10"  (1.778 m)   Body mass index is 24.68 kg/m. Physical Exam  Constitutional: He is oriented to person, place, and time. He appears well-developed and well-nourished. No distress.  HENT:  Head: Normocephalic and atraumatic.  Mouth/Throat: Oropharynx is clear and moist.  Eyes: Right eye exhibits no discharge. Left eye exhibits no discharge.  Neck: Normal range of motion. Neck supple. No thyromegaly present.  Cardiovascular: Normal rate and regular rhythm.  Murmur heard. Pulmonary/Chest: Effort normal and breath sounds normal.  Abdominal: Soft. Bowel sounds are normal. There is no tenderness. There is no guarding.  Musculoskeletal:  Able to move all 4 extremities, unsteady gait  Neurological: He is  alert and oriented to person, place, and time.  Skin: Skin is warm and dry. He is not diaphoretic.    Labs reviewed: Recent Labs    01/16/17 1436  04/07/17 2358 05/05/17 0000 07/16/17 1856  NA 142   < > 138 140 141  K 3.8   < > 4.1 4.6 3.5  CL 103   < > 104 103 100*  CO2 27  --  26 32  --   GLUCOSE 105*   < > 97 90 98  BUN 13   < > 15 10 9   CREATININE 0.91   < > 0.93 0.80 0.80  CALCIUM 8.8  --  9.0 8.9  --    < > = values in this interval not displayed.   Recent Labs    08/15/16 01/01/17 0000 05/05/17 0000  AST 22 29 28   ALT 21 25 22   ALKPHOS 62  --   --   BILITOT  --  0.4 0.5  PROT  --  6.3 6.5   Recent Labs    07/31/16 1359  09/17/16 1517  01/16/17 1436 03/18/17 2026 03/18/17 2113 04/07/17 2358 07/16/17 1856  WBC CANCELED   < > 6.1   < > 6.4 5.9  --  8.6  --   NEUTROABS CANCELED  --  4.0  --   --   --   --  6.2  --   HGB CANCELED   < > 13.4   < > 13.6 13.4 13.9 13.7 13.6  HCT CANCELED   < > 40.5   < > 40.3 40.4 41.0 41.1 40.0  MCV CANCELED   < > 98.6   < > 95 97.3  --  97.4  --   PLT CANCELED   < > 228.0   < > 261 224  --  230  --    < > = values in this interval not displayed.   Lab Results  Component Value Date   TSH 0.31 (A) 07/28/2017   Lab Results  Component Value Date   HGBA1C 5.6 12/07/2015   Lab Results  Component Value Date   CHOL 103 05/05/2017   HDL 39 (L) 05/05/2017   LDLCALC 43 05/05/2017   TRIG 129 05/05/2017   CHOLHDL 2.6 05/05/2017    Significant Diagnostic Results in last 30 days:  No results found.  Assessment/Plan  1. Acquired hypothyroidism Low TSH and normal free T4. With him being on amiodarone and his symptom of ongoing fatigue and constipation, decrease levothyroxine to 100 mcg daily and monitor. Check TSH and free T4 in 8 weeks.   2. Constipation, slow transit Continue senokot s 2 tab qhs. Add miralax 17 g daily and monitor.    Family/ staff Communication: reviewed care plan with patient and charge nurse.     Labs/tests ordered:  TSH and free T4 in 8 weeks  Blanchie Serve, MD Internal Medicine [facility] [address] [place], [place] [address] Cell Phone (Monday-Friday 8 am - 5 pm): [phone] On Call: [phone] and follow prompts after 5 pm and on weekends Office Phone: [phone] Office Fax: [phone]

## 2017-07-30 NOTE — Telephone Encounter (Signed)
LMOVM reminding pt to send remote transmission.   

## 2017-07-31 ENCOUNTER — Encounter: Payer: Self-pay | Admitting: Cardiology

## 2017-07-31 DIAGNOSIS — E039 Hypothyroidism, unspecified: Secondary | ICD-10-CM

## 2017-07-31 LAB — THYROID PANEL WITH TSH
Free Thyroxine Index: 3.7 (ref 1.4–3.8)
T3 Uptake: 36 % — ABNORMAL HIGH (ref 22–35)
T4, Total: 10.4 ug/dL (ref 4.9–10.5)
TSH: 0.33 mIU/L — ABNORMAL LOW (ref 0.40–4.50)

## 2017-08-01 ENCOUNTER — Encounter: Payer: Self-pay | Admitting: Internal Medicine

## 2017-08-01 ENCOUNTER — Non-Acute Institutional Stay (SKILLED_NURSING_FACILITY): Payer: Medicare Other | Admitting: Internal Medicine

## 2017-08-01 DIAGNOSIS — G4761 Periodic limb movement disorder: Secondary | ICD-10-CM | POA: Diagnosis not present

## 2017-08-01 DIAGNOSIS — K644 Residual hemorrhoidal skin tags: Secondary | ICD-10-CM | POA: Diagnosis not present

## 2017-08-01 DIAGNOSIS — L539 Erythematous condition, unspecified: Secondary | ICD-10-CM | POA: Diagnosis not present

## 2017-08-01 DIAGNOSIS — K921 Melena: Secondary | ICD-10-CM

## 2017-08-01 NOTE — Progress Notes (Signed)
Location:  [facility] Room Number: 9 Place of Service:  SNF ([phone]) Provider:  Blanchie Serve, MD  Blanchie Serve, MD  Patient Care Team: Blanchie Serve, MD as PCP - General (Internal Medicine) Croitoru, Dani Gobble, MD as Attending Physician (Cardiology) Vevelyn Royals, MD as Consulting Physician (Ophthalmology) Franchot Gallo, MD as Consulting Physician (Urology) Tanda Rockers, MD as Consulting Physician (Pulmonary Disease) Allyn Kenner, MD (Dermatology) Deliah Goody, PA-C as Physician Assistant (Physician Assistant) Ngetich, Nelda Bucks, NP as Nurse Practitioner (Family Medicine)  Extended Emergency Contact Information Primary Emergency Contact: Silvernail,[name] Address: [address]          [place] Phone: [phone] Mobile Phone: [phone] Relation: Spouse Secondary Emergency Contact: [name],Rentz  [place] Mobile Phone: [phone] Relation: Daughter   Goals of care: Advanced Directive information Advanced Directives 08/01/2017  Does Patient Have a Medical Advance Directive? Yes  Type of Paramedic of [place];Living will  Does patient want to make changes to medical advance directive? No - Patient declined  Copy of [place] in Chart? Yes  Would patient like information on creating a medical advance directive? -  Pre-existing out of facility DNR order (yellow form or pink MOST form) -     Chief Complaint  Patient presents with  . Acute Visit    Involuntary jerking of his arms and bright red blood in his stool.     HPI:  Pt is a 84 y.o. male seen today for an acute visit for 2 concerns from nursing. He is seen in his room with his wife. Staff noticed involuntary movement of his arms 2 days back that self resolved. No change in mental state reported with it. No new weakness of extremities reported. Per pt and his wife, this is not a new finding and  with his history of restless limb syndrome, he has had this before. He is currently on requip. Staff noticed bright red blood in his stool for 1 episode. Patient had a bowel movement this am and denies any further blood in stool. Denies blood in urine.    Past Medical History:  Diagnosis Date  . Arthritis    "minor, back and sometimes knees" (12/15/2012)  . Bradycardia    AFib/SSS s/p St Jude PPM 04/12/2008  . CAD (coronary artery disease) 12/30/2014   CABG (LIMA-LAD, SVG-RCA, SVG-OM in 1996).  07/2009 BMS to SVG-RCA. Cath in 04/2010 with patent stents   . Cardiomyopathy, ischemic 08/25/2012  . CHF (congestive heart failure) ([place])   . Chronic knee pain 12/03/2014  . Combined congestive systolic and diastolic heart failure ([place]) 02/02/2015   Hx EF 41%. BNP 96.8 02/21/15 Torsemide 04/06/15 Na 142, K 4.6, Bun 16, creat 0.89 04/20/14 BNP 111.7, Na 142, K 4.6, Bun 16, creat 0.9   . Depression with anxiety 02/02/2015   02/21/15 Hgb A1c 5.8 03/10/15 MMSE 30/30   . Dizziness, after diuretic asscoiated with hypotension and responded to fluid bolus 06/05/2011   04/28/15 US carotid R+L normal bilateral arterial velocities.    Marland Kitchen Dyspnea 08/11/2014   Followed in Pulmonary clinic/ [facility]/ [facility]  - 08/11/2014  Walked RA x 1 laps @ 185 ft each stopped due to fatigue/off balance/ slow pace/  no sob or desat  - PFT's  09/26/2014  FEV1 2.26 (85 % ) ratio 76  p no % improvement from saba with DLCO  67 % corrects to 93 % for alv volume  Since prev study 08/04/13 minimal change lung vol or dlco    . Embolic cerebral infarction ([place]) 12/06/2015  . Exertional shortness of breath    "sometimes walking" (12/15/2012)  . GERD (gastroesophageal reflux disease)   . Gout 02/09/2015  . Heart murmur    "just told I had one today" (12/15/2012)  . Hiatal hernia   . Hyperlipidemia   . Hypertension   . Hypothyroid   . Influenza A 03/10/2016  . Insomnia   . Melanoma of back ([place]) 1976  . Myocardial infarction [facility]) 1996; 2011     "both silent" (12/15/2012)  . Nonrheumatic aortic valve stenosis   . Orthostatic hypotension   . Osteoporosis, senile   . Pacemaker   . RBBB   . Restless leg 02/02/2015  . Right leg weakness 12/06/2015  . [facility] spotted fever   . S/P CABG x 4   . Sick sinus syndrome ([place]) 01/31/2014  . Sustained ventricular tachycardia ([place]) 07/27/2014  . Urinary retention 12/30/2014   Past Surgical History:  Procedure Laterality Date  . CARDIAC CATHETERIZATION  04/2010   LIMA to LAD patent,SVG to OM patent,no in-stnet restenosis RCA  . CATARACT EXTRACTION W/ INTRAOCULAR LENS  IMPLANT, BILATERAL Bilateral 2012  . CORONARY ANGIOPLASTY WITH STENT PLACEMENT  07/2009   bare metal stent to SVG to the RCA  . CORONARY ARTERY BYPASS GRAFT  1996   LIMA to LAD,SVG to RCA & SVG to OM  . INSERT / REPLACE / REMOVE PACEMAKER  2010  . MELANOMA EXCISION  05/1974 X2   "taken off my back" (12/15/2012)  . NM MYOVIEW LTD  06/2011   low risk  . PPM GENERATOR CHANGEOUT N/A 01/22/2017   Procedure: PPM GENERATOR CHANGEOUT;  Surgeon: [name], MD;  Location: [place] CV LAB;  Service: Cardiovascular;  Laterality: N/A;  . TONSILLECTOMY  1938  . TRANSURETHRAL RESECTION OF PROSTATE  1986  . US ECHOCARDIOGRAPHY  07/11/2009   EF 45-50%    Allergies  Allergen Reactions  . Altace [Ramipril] Cough  . Crestor [Rosuvastatin Calcium] Rash  . Penicillins Rash and Other (See Comments)    Has patient had a PCN reaction causing immediate rash, facial/tongue/throat swelling, SOB or lightheadedness with hypotension: Yes Has patient had a PCN reaction causing severe rash involving mucus membranes or skin necrosis: No Has patient had a PCN reaction that required hospitalization No Has patient had a PCN reaction occurring within the last 10 years: No If all of the above answers are "NO", then may proceed with Cephalosporin use.   . Sulfa Antibiotics Rash    Outpatient Encounter Medications as of 08/01/2017   Medication Sig  . acetaminophen (TYLENOL) 325 MG tablet Take 650 mg by mouth every 8 (eight) hours as needed for mild pain, moderate pain or headache.   Marland Kitchen amiodarone (PACERONE) 100 MG tablet Take 1 tablet (100 mg total) by mouth daily.  Marland Kitchen aspirin EC 81 MG tablet Take 1 tablet (81 mg total) by mouth daily.  . Cholecalciferol (VITAMIN D3) 5000 UNITS CAPS Take 5,000 Units by mouth every Monday.   . clopidogrel (PLAVIX) 75 MG tablet TAKE 1 TABLET ONCE DAILY.  Marland Kitchen Droxidopa ([name]) 200 MG CAPS Take 200 mg by mouth 3 (three) times daily.  Marland Kitchen guaiFENesin (MUCINEX) 600 MG 12 hr tablet Take 600 mg by mouth daily as needed for cough.   . levothyroxine (SYNTHROID, LEVOTHROID) 100 MCG tablet Take 100 mcg by mouth daily before breakfast.  . midodrine (PROAMATINE) 10 MG tablet  TAKE 1 TABLET THREE TIMES DAILY. TAKE LAST DOSE AT LEAST 6 HOURS BEFORE BEDTIME.  . pantoprazole (PROTONIX) 40 MG tablet TAKE 1 TABLET BY MOUTH TWICE DAILY.  Marland Kitchen polyethylene glycol (MIRALAX / GLYCOLAX) packet Take 17 g by mouth daily. Hold for loose stool  . pravastatin (PRAVACHOL) 40 MG tablet Take 1 tablet (40 mg total) by mouth at bedtime.  Marland Kitchen rOPINIRole (REQUIP) 0.5 MG tablet TAKE 1 TABLET 3 TIMES A DAY AS NEEDED.  Marland Kitchen sennosides-docusate sodium (SENOKOT-S) 8.6-50 MG tablet Take 2 tablets by mouth at bedtime.   . Sodium Chloride Flush (NORMAL SALINE FLUSH) 0.9 % SOLN 100 mLs daily. Flush foley catheter in the evening and as needed. Also flush foley 2-3 times weekly  . torsemide (DEMADEX) 10 MG tablet Take 5 mg by mouth every other day.   . traZODone (DESYREL) 50 MG tablet Take 1 tablet (50 mg total) by mouth at bedtime.  . bisacodyl (DULCOLAX) 10 MG suppository Place 10 mg rectally as needed for moderate constipation.  . [DISCONTINUED] magnesium hydroxide (MILK OF MAGNESIA) 400 MG/5ML suspension Take 30 mLs by mouth as needed for mild constipation.   No facility-administered encounter medications on file as of 08/01/2017.     Review  of Systems  Constitutional: Negative for appetite change and fever.  HENT: Negative for nosebleeds.   Eyes: Negative for visual disturbance.  Respiratory: Negative for cough.        Denies worsening of his dyspnea  Cardiovascular: Negative for chest pain and palpitations.  Gastrointestinal: Positive for constipation. Negative for abdominal pain, anal bleeding, diarrhea, nausea, rectal pain and vomiting.       Discomfort with soreness to bottom  Musculoskeletal: Positive for gait problem.  Neurological: Negative for dizziness, seizures and headaches.    Immunization History  Administered Date(s) Administered  . Influenza, High Dose Seasonal PF 11/01/2014, 11/13/2016  . Influenza-Unspecified 12/05/2013, 11/05/2014, 11/16/2015  . PPD Test 03/07/2014  . Pneumococcal Conjugate-13 10/05/2009  . Pneumococcal Polysaccharide-23 11/05/2005  . Pneumococcal-Unspecified 10/05/2009  . Tdap 08/02/2014  . Zoster 11/05/2005   Pertinent  Health Maintenance Due  Topic Date Due  . PNA vac Low Risk Adult (2 of 2 - PCV13) 10/06/2010  . INFLUENZA VACCINE  09/04/2017  . DEXA SCAN  12/14/2024   Fall Risk  10/02/2016 10/30/2015 09/05/2015 02/02/2015  Falls in the past year? No No No No  Risk for fall due to : - Impaired balance/gait - -   Functional Status Survey:    Vitals:   08/01/17 1213  BP: 119/69  Pulse: 70  Resp: 20  Temp: (!) 97.1 F (36.2 C)  TempSrc: Oral  SpO2: 97%  Weight: 172 lb (78 kg)  Height: 5\' 10"  (1.778 m)   Body mass index is 24.68 kg/m. Physical Exam  Constitutional: He is oriented to person, place, and time. He appears well-developed and well-nourished. No distress.  HENT:  Head: Normocephalic and atraumatic.  Mouth/Throat: Oropharynx is clear and moist. No oropharyngeal exudate.  Eyes: Pupils are equal, round, and reactive to light. Conjunctivae are normal. Right eye exhibits no discharge. Left eye exhibits no discharge.  Neck: Neck supple.  Cardiovascular: Normal  rate and regular rhythm.  Pulmonary/Chest: Effort normal and breath sounds normal. No respiratory distress. He has no wheezes. He has no rales.  Abdominal: Soft. Bowel sounds are normal. He exhibits no mass. There is no tenderness. There is no guarding.  Genitourinary:  Genitourinary Comments: Erythema to skin area in between buttock, external hemorrhoid noted, no active  bleed, no skin breakdown  Musculoskeletal:  Unsteady gait, able to move all 4 extremities, normal tone and strength, no jerking movement noted  Lymphadenopathy:    He has no cervical adenopathy.  Neurological: He is alert and oriented to person, place, and time.  Skin: He is not diaphoretic.    Labs reviewed: Recent Labs    01/16/17 1436  04/07/17 2358 05/05/17 0000 07/16/17 1856  NA 142   < > 138 140 141  K 3.8   < > 4.1 4.6 3.5  CL 103   < > 104 103 100*  CO2 27  --  26 32  --   GLUCOSE 105*   < > 97 90 98  BUN 13   < > 15 10 9   CREATININE 0.91   < > 0.93 0.80 0.80  CALCIUM 8.8  --  9.0 8.9  --    < > = values in this interval not displayed.   Recent Labs    08/15/16 01/01/17 0000 05/05/17 0000  AST 22 29 28   ALT 21 25 22   ALKPHOS 62  --   --   BILITOT  --  0.4 0.5  PROT  --  6.3 6.5   Recent Labs    09/17/16 1517  01/16/17 1436 03/18/17 2026 03/18/17 2113 04/07/17 2358 07/16/17 1856  WBC 6.1   < > 6.4 5.9  --  8.6  --   NEUTROABS 4.0  --   --   --   --  6.2  --   HGB 13.4   < > 13.6 13.4 13.9 13.7 13.6  HCT 40.5   < > 40.3 40.4 41.0 41.1 40.0  MCV 98.6   < > 95 97.3  --  97.4  --   PLT 228.0   < > 261 224  --  230  --    < > = values in this interval not displayed.   Lab Results  Component Value Date   TSH 0.33 (L) 07/30/2017   Lab Results  Component Value Date   HGBA1C 5.6 12/07/2015   Lab Results  Component Value Date   CHOL 103 05/05/2017   HDL 39 (L) 05/05/2017   LDLCALC 43 05/05/2017   TRIG 129 05/05/2017   CHOLHDL 2.6 05/05/2017    Significant Diagnostic Results in  last 30 days:  No results found.  Assessment/Plan  1. PLMD (periodic limb movement disorder) Continue requip current regimen. Pt has orthostatic hypotension and to avoid requip increment until symptoms are not controlled.   2. External hemorrhoid Hydrocortisone rectal cream 25 mg bid x 2 weeks and then bid prn  3. Erythema of skin In perianal area. Will have staff do skin care with zinc oxide bid for now. Monitor for skin breakdown and signs of infection  4. Blood in stool 1 episode, none further. Has chronic constipation and had been having trouble with bowel movement few days back with straining. Has external hemorrhoid on exam. Likely could have internal hemorrhoid as well. He likely had hemorrhoidal bleed. Monitor for further bleed. Add hydrocortisone rectal cream bid for 2 weeks and then bid prn only and monitor. If he has another episode, with do FOBT and further exam. Recent changes made to bowel regimen with addition of bowel regimen    Family/ staff Communication: reviewed care plan with patient, wife and charge nurse.    Labs/tests ordered:  none  Blanchie Serve, MD Internal Medicine [facility] [address]  [place], [place] [address] Cell Phone (Monday-Friday 8 am - 5 pm): [phone] On Call: [phone] and follow prompts after 5 pm and on weekends Office Phone: [phone] Office Fax: [phone]

## 2017-08-04 ENCOUNTER — Encounter: Payer: Self-pay | Admitting: Internal Medicine

## 2017-08-07 ENCOUNTER — Encounter: Payer: Self-pay | Admitting: Internal Medicine

## 2017-08-07 ENCOUNTER — Non-Acute Institutional Stay (SKILLED_NURSING_FACILITY): Payer: Medicare Other | Admitting: Internal Medicine

## 2017-08-07 DIAGNOSIS — I5043 Acute on chronic combined systolic (congestive) and diastolic (congestive) heart failure: Secondary | ICD-10-CM

## 2017-08-07 DIAGNOSIS — G903 Multi-system degeneration of the autonomic nervous system: Secondary | ICD-10-CM | POA: Diagnosis not present

## 2017-08-07 NOTE — Progress Notes (Signed)
Location:  [facility] of Service:  SNF (31) Provider:  Blanchie Serve, MD  Blanchie Serve, MD  Patient Care Team: Blanchie Serve, MD as PCP - General (Internal Medicine) Croitoru, Dani Gobble, MD as Attending Physician (Cardiology) Vevelyn Royals, MD as Consulting Physician (Ophthalmology) Franchot Gallo, MD as Consulting Physician (Urology) Tanda Rockers, MD as Consulting Physician (Pulmonary Disease) Allyn Kenner, MD (Dermatology) Deliah Goody, PA-C as Physician Assistant (Physician Assistant) Ngetich, Nelda Bucks, NP as Nurse Practitioner (Family Medicine)  Extended Emergency Contact Information Primary Emergency Contact: Cajas,[name] Address: [address]          [place] Phone: [phone] Mobile Phone: [phone] Relation: Spouse Secondary Emergency Contact: [name],Wemhoff  [place] Mobile Phone: [phone] Relation: Daughter   Goals of care: Advanced Directive information Advanced Directives 08/01/2017  Does Patient Have a Medical Advance Directive? Yes  Type of Paramedic of [place];Living will  Does patient want to make changes to medical advance directive? No - Patient declined  Copy of [place] in Chart? Yes  Would patient like information on creating a medical advance directive? -  Pre-existing out of facility DNR order (yellow form or pink MOST form) -     Chief Complaint  Patient presents with  . Acute Visit    leg edema and weight gain    HPI:  Pt is a 84 y.o. male seen today for an acute visit for weight gain and leg edema. Per nursing report he weighed 179 lb yesterday and staff have noticed increase in swelling of his legs over last 2 days. Pt seen in the room with charge nurse present. He mentions that his shoes have been tight than before and he has noticed increase in swelling in his legs for last 2-3 days. He has ongoing dyspnea  with moderate exertion. He has to take a break during his walk to dining area and rest. Denies worsening of his dyspnea.    Past Medical History:  Diagnosis Date  . Arthritis    "minor, back and sometimes knees" (12/15/2012)  . Bradycardia    AFib/SSS s/p St Jude PPM 04/12/2008  . CAD (coronary artery disease) 12/30/2014   CABG (LIMA-LAD, SVG-RCA, SVG-OM in 1996).  07/2009 BMS to SVG-RCA. Cath in 04/2010 with patent stents   . Cardiomyopathy, ischemic 08/25/2012  . CHF (congestive heart failure) ([place])   . Chronic knee pain 12/03/2014  . Combined congestive systolic and diastolic heart failure ([place]) 02/02/2015   Hx EF 41%. BNP 96.8 02/21/15 Torsemide 04/06/15 Na 142, K 4.6, Bun 16, creat 0.89 04/20/14 BNP 111.7, Na 142, K 4.6, Bun 16, creat 0.9   . Depression with anxiety 02/02/2015   02/21/15 Hgb A1c 5.8 03/10/15 MMSE 30/30   . Dizziness, after diuretic asscoiated with hypotension and responded to fluid bolus 06/05/2011   04/28/15 US carotid R+L normal bilateral arterial velocities.    Marland Kitchen Dyspnea 08/11/2014   Followed in Pulmonary clinic/ [facility]/ [place]  - 08/11/2014  Walked RA x 1 laps @ 185 ft each stopped due to fatigue/off balance/ slow pace/  no sob or desat  - PFT's  09/26/2014  FEV1 2.26 (85 % ) ratio 76  p no % improvement from saba with DLCO  67 % corrects to 93 % for alv volume      Since prev study 08/04/13 minimal change lung vol or dlco    . Embolic cerebral infarction ([place])  12/06/2015  . Exertional shortness of breath    "sometimes walking" (12/15/2012)  . GERD (gastroesophageal reflux disease)   . Gout 02/09/2015  . Heart murmur    "just told I had one today" (12/15/2012)  . Hiatal hernia   . Hyperlipidemia   . Hypertension   . Hypothyroid   . Influenza A 03/10/2016  . Insomnia   . Melanoma of back ([place]) 1976  . Myocardial infarction [facility]) 1996; 2011   "both silent" (12/15/2012)  . Nonrheumatic aortic valve stenosis   . Orthostatic hypotension   . Osteoporosis, senile   .  Pacemaker   . RBBB   . Restless leg 02/02/2015  . Right leg weakness 12/06/2015  . [facility] spotted fever   . S/P CABG x 4   . Sick sinus syndrome ([place]) 01/31/2014  . Sustained ventricular tachycardia ([place]) 07/27/2014  . Urinary retention 12/30/2014   Past Surgical History:  Procedure Laterality Date  . CARDIAC CATHETERIZATION  04/2010   LIMA to LAD patent,SVG to OM patent,no in-stnet restenosis RCA  . CATARACT EXTRACTION W/ INTRAOCULAR LENS  IMPLANT, BILATERAL Bilateral 2012  . CORONARY ANGIOPLASTY WITH STENT PLACEMENT  07/2009   bare metal stent to SVG to the RCA  . CORONARY ARTERY BYPASS GRAFT  1996   LIMA to LAD,SVG to RCA & SVG to OM  . INSERT / REPLACE / REMOVE PACEMAKER  2010  . MELANOMA EXCISION  05/1974 X2   "taken off my back" (12/15/2012)  . NM MYOVIEW LTD  06/2011   low risk  . PPM GENERATOR CHANGEOUT N/A 01/22/2017   Procedure: PPM GENERATOR CHANGEOUT;  Surgeon: [name], MD;  Location: [facility];  Service: Cardiovascular;  Laterality: N/A;  . TONSILLECTOMY  1938  . TRANSURETHRAL RESECTION OF PROSTATE  1986  . US ECHOCARDIOGRAPHY  07/11/2009   EF 45-50%    Allergies  Allergen Reactions  . Altace [Ramipril] Cough  . Crestor [Rosuvastatin Calcium] Rash  . Penicillins Rash and Other (See Comments)    Has patient had a PCN reaction causing immediate rash, facial/tongue/throat swelling, SOB or lightheadedness with hypotension: Yes Has patient had a PCN reaction causing severe rash involving mucus membranes or skin necrosis: No Has patient had a PCN reaction that required hospitalization No Has patient had a PCN reaction occurring within the last 10 years: No If all of the above answers are "NO", then may proceed with Cephalosporin use.   . Sulfa Antibiotics Rash    Outpatient Encounter Medications as of 08/07/2017  Medication Sig  . acetaminophen (TYLENOL) 325 MG tablet Take 650 mg by mouth every 8 (eight) hours as needed for mild pain, moderate  pain or headache.   Marland Kitchen amiodarone (PACERONE) 100 MG tablet Take 1 tablet (100 mg total) by mouth daily.  Marland Kitchen aspirin EC 81 MG tablet Take 1 tablet (81 mg total) by mouth daily.  . bisacodyl (DULCOLAX) 10 MG suppository Place 10 mg rectally as needed for moderate constipation.  . Cholecalciferol (VITAMIN D3) 5000 UNITS CAPS Take 5,000 Units by mouth every Monday.   . clopidogrel (PLAVIX) 75 MG tablet TAKE 1 TABLET ONCE DAILY.  Marland Kitchen Droxidopa ([name]) 200 MG CAPS Take 200 mg by mouth 3 (three) times daily.  Marland Kitchen guaiFENesin (MUCINEX) 600 MG 12 hr tablet Take 600 mg by mouth daily as needed for cough.   . hydrocortisone (ANUSOL-HC) 2.5 % rectal cream Place 1 application rectally 2 (two) times daily.  Marland Kitchen levothyroxine (SYNTHROID, LEVOTHROID) 100 MCG tablet Take 100  mcg by mouth daily before breakfast.  . midodrine (PROAMATINE) 10 MG tablet TAKE 1 TABLET THREE TIMES DAILY. TAKE LAST DOSE AT LEAST 6 HOURS BEFORE BEDTIME.  . pantoprazole (PROTONIX) 40 MG tablet TAKE 1 TABLET BY MOUTH TWICE DAILY.  Marland Kitchen polyethylene glycol (MIRALAX / GLYCOLAX) packet Take 17 g by mouth daily. Hold for loose stool  . pravastatin (PRAVACHOL) 40 MG tablet Take 1 tablet (40 mg total) by mouth at bedtime.  Marland Kitchen rOPINIRole (REQUIP) 0.5 MG tablet TAKE 1 TABLET 3 TIMES A DAY AS NEEDED.  Marland Kitchen sennosides-docusate sodium (SENOKOT-S) 8.6-50 MG tablet Take 2 tablets by mouth at bedtime.   . Sodium Chloride Flush (NORMAL SALINE FLUSH) 0.9 % SOLN 100 mLs daily. Flush foley catheter in the evening and as needed. Also flush foley 2-3 times weekly  . torsemide (DEMADEX) 10 MG tablet Take 5 mg by mouth every other day.   . traZODone (DESYREL) 50 MG tablet Take 1 tablet (50 mg total) by mouth at bedtime.   No facility-administered encounter medications on file as of 08/07/2017.     Review of Systems  Constitutional: Positive for fatigue. Negative for appetite change, chills, diaphoresis and fever.  HENT: Positive for hearing loss. Negative for  congestion, mouth sores, rhinorrhea and trouble swallowing.   Respiratory: Negative for wheezing.        Has occasional cough. Has chronic dyspnea.   Cardiovascular: Positive for leg swelling. Negative for chest pain and palpitations.  Gastrointestinal: Positive for constipation. Negative for abdominal pain, nausea, rectal pain and vomiting.  Musculoskeletal: Positive for gait problem.  Neurological: Positive for dizziness and weakness. Negative for headaches.  Psychiatric/Behavioral: Negative for behavioral problems.    Immunization History  Administered Date(s) Administered  . Influenza, High Dose Seasonal PF 11/01/2014, 11/13/2016  . Influenza-Unspecified 12/05/2013, 11/05/2014, 11/16/2015  . PPD Test 03/07/2014  . Pneumococcal Conjugate-13 10/05/2009  . Pneumococcal Polysaccharide-23 11/05/2005  . Pneumococcal-Unspecified 10/05/2009  . Tdap 08/02/2014  . Zoster 11/05/2005   Pertinent  Health Maintenance Due  Topic Date Due  . PNA vac Low Risk Adult (2 of 2 - PCV13) 10/06/2010  . INFLUENZA VACCINE  09/04/2017  . DEXA SCAN  12/14/2024   Fall Risk  10/02/2016 10/30/2015 09/05/2015 02/02/2015  Falls in the past year? No No No No  Risk for fall due to : - Impaired balance/gait - -   Functional Status Survey:    Vitals:   08/07/17 1046  BP: 118/70  Pulse: 73  Resp: 18  Temp: (!) 96.7 F (35.9 C)  SpO2: 95%  Weight: 175 lb (79.4 kg)  Height: 5\' 10"  (1.778 m)   Body mass index is 25.11 kg/m.   Weight: 175 lb (79.4 kg)   Wt Readings from Last 3 Encounters:  08/07/17 175 lb (79.4 kg)  08/01/17 172 lb (78 kg)  07/30/17 172 lb (78 kg)   Physical Exam  Constitutional: He is oriented to person, place, and time. No distress.  Frail elderly chronically ill appearing male  HENT:  Head: Normocephalic.  Mouth/Throat: Oropharynx is clear and moist.  Eyes: Pupils are equal, round, and reactive to light. Conjunctivae and EOM are normal. Right eye exhibits no discharge. Left eye  exhibits no discharge.  Neck: Normal range of motion. Neck supple.  Cardiovascular: Normal rate and regular rhythm.  Murmur heard. Pulmonary/Chest: Effort normal. No respiratory distress. He has no wheezes. He has rales. He exhibits no tenderness.  Abdominal: Soft. Bowel sounds are normal. There is no tenderness. There is no guarding.  Musculoskeletal: He exhibits edema. He exhibits no tenderness.  1+ pitting leg edema, intact skin, feet edema as well  Lymphadenopathy:    He has no cervical adenopathy.  Neurological: He is alert and oriented to person, place, and time.  Skin: He is not diaphoretic.    Labs reviewed: Recent Labs    01/16/17 1436  04/07/17 2358 05/05/17 0000 07/16/17 1856  NA 142   < > 138 140 141  K 3.8   < > 4.1 4.6 3.5  CL 103   < > 104 103 100*  CO2 27  --  26 32  --   GLUCOSE 105*   < > 97 90 98  BUN 13   < > 15 10 9   CREATININE 0.91   < > 0.93 0.80 0.80  CALCIUM 8.8  --  9.0 8.9  --    < > = values in this interval not displayed.   Recent Labs    08/15/16 01/01/17 0000 05/05/17 0000  AST 22 29 28   ALT 21 25 22   ALKPHOS 62  --   --   BILITOT  --  0.4 0.5  PROT  --  6.3 6.5   Recent Labs    09/17/16 1517  01/16/17 1436 03/18/17 2026 03/18/17 2113 04/07/17 2358 07/16/17 1856  WBC 6.1   < > 6.4 5.9  --  8.6  --   NEUTROABS 4.0  --   --   --   --  6.2  --   HGB 13.4   < > 13.6 13.4 13.9 13.7 13.6  HCT 40.5   < > 40.3 40.4 41.0 41.1 40.0  MCV 98.6   < > 95 97.3  --  97.4  --   PLT 228.0   < > 261 224  --  230  --    < > = values in this interval not displayed.   Lab Results  Component Value Date   TSH 0.33 (L) 07/30/2017   Lab Results  Component Value Date   HGBA1C 5.6 12/07/2015   Lab Results  Component Value Date   CHOL 103 05/05/2017   HDL 39 (L) 05/05/2017   LDLCALC 43 05/05/2017   TRIG 129 05/05/2017   CHOLHDL 2.6 05/05/2017    Significant Diagnostic Results in last 30 days:  No results found.  Assessment/Plan  1.  Acute on chronic combined systolic and diastolic congestive heart failure (HCC) Has increased leg edema, weight gain and crackles on lung exam. No worsening dyspnea reported. Will change his torsemide to 10 mg daily for 3 days, then 5 mg daily. Check BMP 08/11/17. Daily weight for now. Ted hose to both legs and keep legs elevated at rest.   2. Neurogenic orthostatic hypotension (HCC) Continue droxidopa and midodrine for now. Monitor BP    Family/ staff Communication: reviewed care plan with patient and charge nurse.  Labs/tests ordered:  BMP 08/11/17  Blanchie Serve, MD Internal Medicine [facility] [address] [place], [place] [address] Cell Phone (Monday-Friday 8 am - 5 pm): [phone] On Call: [phone] and follow prompts after 5 pm and on weekends Office Phone: [phone] Office Fax: [phone]

## 2017-08-08 LAB — CUP PACEART INCLINIC DEVICE CHECK
Date Time Interrogation Session: 20190705144435
Implantable Lead Implant Date: 20100309
Implantable Lead Implant Date: 20100309
Implantable Lead Location: 753859
Implantable Lead Location: 753860
Implantable Pulse Generator Implant Date: 20181219
Lead Channel Setting Pacing Amplitude: 0.875
Lead Channel Setting Pacing Amplitude: 2.5 V
Lead Channel Setting Pacing Pulse Width: 0.4 ms
Lead Channel Setting Sensing Sensitivity: 2 mV
Pulse Gen Model: 2272
Pulse Gen Serial Number: 8965776

## 2017-08-11 LAB — BASIC METABOLIC PANEL
BUN: 15 (ref 4–21)
Calcium: 8.8
Creat: 0.75
Creatinine: 0.8 (ref 0.6–1.3)
Glucose: 94
Glucose: 94
Potassium: 4.1
Potassium: 4.1 (ref 3.4–5.3)
Sodium: 140
Sodium: 140 (ref 137–147)

## 2017-08-12 ENCOUNTER — Telehealth: Payer: Self-pay | Admitting: Pharmacist Clinician (PhC)/ Clinical Pharmacy Specialist

## 2017-08-12 ENCOUNTER — Encounter: Payer: Self-pay | Admitting: Family

## 2017-08-12 ENCOUNTER — Non-Acute Institutional Stay (SKILLED_NURSING_FACILITY): Payer: Medicare Other | Admitting: Family

## 2017-08-12 DIAGNOSIS — I5042 Chronic combined systolic (congestive) and diastolic (congestive) heart failure: Secondary | ICD-10-CM | POA: Diagnosis not present

## 2017-08-12 MED ORDER — DROXIDOPA 100 MG PO CAPS: 100.0000 mg | ORAL_CAPSULE | Freq: Three times a day (TID) | ORAL | 6 refills | Status: DC

## 2017-08-12 NOTE — Telephone Encounter (Signed)
Daughter called to report that patient not tolerating the 200 mg tid dose.  BP spiking up more frequently, once to 180/97.  Family more comfortable with going back to 100 mg tid.  Rx sent to [facility]

## 2017-08-12 NOTE — Progress Notes (Signed)
Location:  [place] Room Number: 9 Place of Service:  SNF ([phone]) Provider: Saladin Petrelli FNP-C  Blanchie Serve, MD  Patient Care Team: Blanchie Serve, MD as PCP - General (Internal Medicine) Croitoru, Dani Gobble, MD as Attending Physician (Cardiology) Vevelyn Royals, MD as Consulting Physician (Ophthalmology) Franchot Gallo, MD as Consulting Physician (Urology) Tanda Rockers, MD as Consulting Physician (Pulmonary Disease) Allyn Kenner, MD (Dermatology) Deliah Goody, PA-C as Physician Assistant (Physician Assistant) Norine Reddington, Nelda Bucks, NP as Nurse Practitioner (Family Medicine)  Extended Emergency Contact Information Primary Emergency Contact: Sistare,[name] Address: [address], [place] Phone: [phone] Mobile Phone: [phone] Relation: Spouse Secondary Emergency Contact: [name],Scherzer  [place] Mobile Phone: [phone] Relation: Daughter  Code Status:  DNR Goals of care: Advanced Directive information Advanced Directives 08/12/2017  Does Patient Have a Medical Advance Directive? Yes  Type of Advance Directive Living will;Healthcare Power of Attorney  Does patient want to make changes to medical advance directive? -  Copy of [place] in Chart? Yes  Would patient like information on creating a medical advance directive? -  Pre-existing out of facility DNR order (yellow form or pink MOST form) -     Chief Complaint  Patient presents with  . Acute Visit    weight gain and edema    HPI:  Pt is a 84 y.o. male seen today at [facility] for an acute visit for follow up weight gain and edema.He was recently seen by MD on 08/07/2017 for acute on chronic CHF.He had weight gain and worsening edema.His Torsemide was increased to 10 mg tablet x 3 days then 5 mg tablet daily.bilateral Ted hose was also added and encouraged to keep legs elevated at rest. He is seen in  his room today with wife at bedside.he states has a good day today.he has worked with therapy.he denies any cough,shortness of breath or wheezing.Facility Nurse reports no new concerns.his weight readings reviewed weight trending down: Wt 175 lbs (08/08/2017); 175.8 lbs  (08/10/2017); 174 lbs (08/11/2017); 172 lbs (08/12/2017).   Past Medical History:  Diagnosis Date  . Arthritis    "minor, back and sometimes knees" (12/15/2012)  . Bradycardia    AFib/SSS s/p St Jude PPM 04/12/2008  . CAD (coronary artery disease) 12/30/2014   CABG (LIMA-LAD, SVG-RCA, SVG-OM in 1996).  07/2009 BMS to SVG-RCA. Cath in 04/2010 with patent stents   . Cardiomyopathy, ischemic 08/25/2012  . CHF (congestive heart failure) ([place])   . Chronic knee pain 12/03/2014  . Combined congestive systolic and diastolic heart failure ([place]) 02/02/2015   Hx EF 41%. BNP 96.8 02/21/15 Torsemide 04/06/15 Na 142, K 4.6, Bun 16, creat 0.89 04/20/14 BNP 111.7, Na 142, K 4.6, Bun 16, creat 0.9   . Depression with anxiety 02/02/2015   02/21/15 Hgb A1c 5.8 03/10/15 MMSE 30/30   . Dizziness, after diuretic asscoiated with hypotension and responded to fluid bolus 06/05/2011   04/28/15 US carotid R+L normal bilateral arterial velocities.    Marland Kitchen Dyspnea 08/11/2014   Followed in Pulmonary clinic/  Healthcare/ [facility]  - 08/11/2014  Walked RA x 1 laps @ 185 ft each stopped due to fatigue/off balance/ slow pace/  no sob or desat  - PFT's  09/26/2014  FEV1 2.26 (85 % ) ratio 76  p no % improvement from saba with DLCO  67 % corrects to 93 % for alv volume  Since prev study 08/04/13 minimal change lung vol or dlco    . Embolic cerebral infarction ([place]) 12/06/2015  . Exertional shortness of breath    "sometimes walking" (12/15/2012)  . GERD (gastroesophageal reflux disease)   . Gout 02/09/2015  . Heart murmur    "just told I had one today" (12/15/2012)  . Hiatal hernia   . Hyperlipidemia   . Hypertension   . Hypothyroid   . Influenza A 03/10/2016  . Insomnia   .  Melanoma of back ([place]) 1976  . Myocardial infarction [facility]) 1996; 2011   "both silent" (12/15/2012)  . Nonrheumatic aortic valve stenosis   . Orthostatic hypotension   . Osteoporosis, senile   . Pacemaker   . RBBB   . Restless leg 02/02/2015  . Right leg weakness 12/06/2015  . [facility] spotted fever   . S/P CABG x 4   . Sick sinus syndrome ([place]) 01/31/2014  . Sustained ventricular tachycardia ([place]) 07/27/2014  . Urinary retention 12/30/2014   Past Surgical History:  Procedure Laterality Date  . CARDIAC CATHETERIZATION  04/2010   LIMA to LAD patent,SVG to OM patent,no in-stnet restenosis RCA  . CATARACT EXTRACTION W/ INTRAOCULAR LENS  IMPLANT, BILATERAL Bilateral 2012  . CORONARY ANGIOPLASTY WITH STENT PLACEMENT  07/2009   bare metal stent to SVG to the RCA  . CORONARY ARTERY BYPASS GRAFT  1996   LIMA to LAD,SVG to RCA & SVG to OM  . INSERT / REPLACE / REMOVE PACEMAKER  2010  . MELANOMA EXCISION  05/1974 X2   "taken off my back" (12/15/2012)  . NM MYOVIEW LTD  06/2011   low risk  . PPM GENERATOR CHANGEOUT N/A 01/22/2017   Procedure: PPM GENERATOR CHANGEOUT;  Surgeon: [name], MD;  Location: [place] CV LAB;  Service: Cardiovascular;  Laterality: N/A;  . TONSILLECTOMY  1938  . TRANSURETHRAL RESECTION OF PROSTATE  1986  . US ECHOCARDIOGRAPHY  07/11/2009   EF 45-50%    Allergies  Allergen Reactions  . Altace [Ramipril] Cough  . Crestor [Rosuvastatin Calcium] Rash  . Penicillins Rash and Other (See Comments)    Has patient had a PCN reaction causing immediate rash, facial/tongue/throat swelling, SOB or lightheadedness with hypotension: Yes Has patient had a PCN reaction causing severe rash involving mucus membranes or skin necrosis: No Has patient had a PCN reaction that required hospitalization No Has patient had a PCN reaction occurring within the last 10 years: No If all of the above answers are "NO", then may proceed with Cephalosporin use.   . Sulfa  Antibiotics Rash    Outpatient Encounter Medications as of 08/12/2017  Medication Sig  . acetaminophen (TYLENOL) 325 MG tablet Take 650 mg by mouth every 8 (eight) hours as needed for mild pain, moderate pain or headache.   Marland Kitchen amiodarone (PACERONE) 100 MG tablet Take 1 tablet (100 mg total) by mouth daily.  Marland Kitchen aspirin EC 81 MG tablet Take 1 tablet (81 mg total) by mouth daily.  . Cholecalciferol (VITAMIN D3) 5000 UNITS CAPS Take 5,000 Units by mouth every Monday.   . clopidogrel (PLAVIX) 75 MG tablet TAKE 1 TABLET ONCE DAILY.  Marland Kitchen Droxidopa ([name]) 200 MG CAPS Take 200 mg by mouth 3 (three) times daily.  . hydrocortisone (ANUSOL-HC) 2.5 % rectal cream Place 1 application rectally 2 (two) times daily.  Marland Kitchen levothyroxine (SYNTHROID, LEVOTHROID) 100 MCG tablet Take 100 mcg by mouth daily before breakfast.  . midodrine (PROAMATINE) 10 MG tablet TAKE 1 TABLET THREE TIMES  DAILY. TAKE LAST DOSE AT LEAST 6 HOURS BEFORE BEDTIME.  . pantoprazole (PROTONIX) 40 MG tablet TAKE 1 TABLET BY MOUTH TWICE DAILY.  Marland Kitchen polyethylene glycol (MIRALAX / GLYCOLAX) packet Take 17 g by mouth daily. Hold for loose stool  . pravastatin (PRAVACHOL) 40 MG tablet Take 1 tablet (40 mg total) by mouth at bedtime.  Marland Kitchen rOPINIRole (REQUIP) 0.5 MG tablet TAKE 1 TABLET 3 TIMES A DAY AS NEEDED.  Marland Kitchen sennosides-docusate sodium (SENOKOT-S) 8.6-50 MG tablet Take 2 tablets by mouth at bedtime.   . Sodium Chloride Flush (NORMAL SALINE FLUSH) 0.9 % SOLN 100 mLs daily. Flush foley catheter in the evening and as needed. Also flush foley 2-3 times weekly  . torsemide (DEMADEX) 10 MG tablet Take 5 mg by mouth daily.   . traZODone (DESYREL) 50 MG tablet Take 1 tablet (50 mg total) by mouth at bedtime.  . [DISCONTINUED] bisacodyl (DULCOLAX) 10 MG suppository Place 10 mg rectally as needed for moderate constipation.  . [DISCONTINUED] guaiFENesin (MUCINEX) 600 MG 12 hr tablet Take 600 mg by mouth daily as needed for cough.    No facility-administered  encounter medications on file as of 08/12/2017.     Review of Systems  Reason unable to perform ROS: additional information provided by facility Nurse   Constitutional: Negative for appetite change, chills, fatigue and fever.  Respiratory: Negative for cough, chest tightness, shortness of breath and wheezing.   Cardiovascular: Positive for leg swelling. Negative for chest pain and palpitations.  Gastrointestinal: Negative for abdominal distention, abdominal pain, constipation, diarrhea, nausea and vomiting.  Genitourinary: Negative for flank pain and urgency.       Indwelling catheter   Musculoskeletal: Positive for gait problem.  Skin: Negative for color change, pallor and rash.  Psychiatric/Behavioral: Negative for agitation, confusion and sleep disturbance. The patient is not nervous/anxious.     Immunization History  Administered Date(s) Administered  . Influenza, High Dose Seasonal PF 11/01/2014, 11/13/2016  . Influenza-Unspecified 12/05/2013, 11/05/2014, 11/16/2015  . PPD Test 03/07/2014  . Pneumococcal Conjugate-13 10/05/2009  . Pneumococcal Polysaccharide-23 11/05/2005  . Pneumococcal-Unspecified 10/05/2009  . Tdap 08/02/2014  . Zoster 11/05/2005   Pertinent  Health Maintenance Due  Topic Date Due  . PNA vac Low Risk Adult (2 of 2 - PCV13) 10/06/2010  . INFLUENZA VACCINE  09/04/2017  . DEXA SCAN  12/14/2024   Fall Risk  10/02/2016 10/30/2015 09/05/2015 02/02/2015  Falls in the past year? No No No No  Risk for fall due to : - Impaired balance/gait - -   Functional Status Survey:    Vitals:   08/12/17 1002  BP: (!) 153/83  Pulse: 70  Resp: 20  Temp: (!) 96.7 F (35.9 C)  SpO2: 95%  Weight: 172 lb (78 kg)  Height: 5\' 10"  (1.778 m)   Body mass index is 24.68 kg/m. Physical Exam  Constitutional: He appears well-developed and well-nourished. No distress.  HENT:  Head: Normocephalic.  Mouth/Throat: Oropharynx is clear and moist. No oropharyngeal exudate.  Eyes:  Pupils are equal, round, and reactive to light. Conjunctivae and EOM are normal. Right eye exhibits no discharge. Left eye exhibits no discharge. No scleral icterus.  Neck: Normal range of motion. No JVD present. No thyromegaly present.  Cardiovascular: Exam reveals no gallop and no friction rub.  Murmur heard. Pulmonary/Chest: Breath sounds normal. No respiratory distress. He has no wheezes. He has no rales.  Abdominal: Soft. Bowel sounds are normal. He exhibits no distension and no mass. There is no tenderness.  There is no rebound and no guarding.  Musculoskeletal: He exhibits no tenderness.  Moves x 4 extremities.unsteady gait uses Rolator for ambulation.bilateral lower extremities 1-2+ edema.knee high ted hose in place.   Lymphadenopathy:    He has no cervical adenopathy.  Neurological: Gait abnormal.  Alert and oriented to person and place   Skin: Skin is warm and dry. No rash noted. No erythema. No pallor.  Psychiatric: He has a normal mood and affect. His speech is normal and behavior is normal. Judgment and thought content normal.  Nursing note and vitals reviewed.   Labs reviewed: Recent Labs    01/16/17 1436  04/07/17 2358 05/05/17 0000 07/16/17 1856 08/11/17  NA 142   < > 138 140 141 140  K 3.8   < > 4.1 4.6 3.5 4.1  CL 103   < > 104 103 100*  --   CO2 27  --  26 32  --   --   GLUCOSE 105*   < > 97 90 98  --   BUN 13   < > 15 10 9 15   CREATININE 0.91   < > 0.93 0.80 0.80 0.75  CALCIUM 8.8  --  9.0 8.9  --  8.8   < > = values in this interval not displayed.   Recent Labs    08/15/16 01/01/17 0000 05/05/17 0000  AST 22 29 28   ALT 21 25 22   ALKPHOS 62  --   --   BILITOT  --  0.4 0.5  PROT  --  6.3 6.5   Recent Labs    09/17/16 1517  01/16/17 1436 03/18/17 2026 03/18/17 2113 04/07/17 2358 07/16/17 1856  WBC 6.1   < > 6.4 5.9  --  8.6  --   NEUTROABS 4.0  --   --   --   --  6.2  --   HGB 13.4   < > 13.6 13.4 13.9 13.7 13.6  HCT 40.5   < > 40.3 40.4 41.0  41.1 40.0  MCV 98.6   < > 95 97.3  --  97.4  --   PLT 228.0   < > 261 224  --  230  --    < > = values in this interval not displayed.   Lab Results  Component Value Date   TSH 0.33 (L) 07/30/2017   Lab Results  Component Value Date   HGBA1C 5.6 12/07/2015   Lab Results  Component Value Date   CHOL 103 05/05/2017   HDL 39 (L) 05/05/2017   LDLCALC 43 05/05/2017   TRIG 129 05/05/2017   CHOLHDL 2.6 05/05/2017    Significant Diagnostic Results in last 30 days:  No results found.  Assessment/Plan  Chronic combined systolic and diastolic congestive heart failure  Weight trending down as above.No cough,shortness of breath,rales or wheezing noted.Has 1-2 + pitting edema.will continue on torsemide 5 mg tablet daily.continue to monitor weight.will increase Torsemide to 10 mg tablet daily if edema worsen or has weight gain.   Family/ staff Communication: Reviewed plan of care with patient's wife,patient and facility Nurse.   Labs/tests ordered: None   Cayle Cordoba C Ocean Schildt, NP

## 2017-08-13 ENCOUNTER — Telehealth: Payer: Self-pay | Admitting: Cardiovascular Disease

## 2017-08-13 NOTE — Telephone Encounter (Signed)
Orders faxed

## 2017-08-13 NOTE — Telephone Encounter (Signed)
New Message:       [facility] is calling and stated they spoke with someone earlier about the pt Adam [name] and the prescription being changed and them needing a new order for this. Please fax Elmyra Ricks at [phone]

## 2017-08-20 ENCOUNTER — Encounter: Payer: Self-pay | Admitting: Family

## 2017-08-20 ENCOUNTER — Non-Acute Institutional Stay (SKILLED_NURSING_FACILITY): Payer: Medicare Other | Admitting: Family

## 2017-08-20 DIAGNOSIS — N319 Neuromuscular dysfunction of bladder, unspecified: Secondary | ICD-10-CM | POA: Diagnosis not present

## 2017-08-20 DIAGNOSIS — M25512 Pain in left shoulder: Secondary | ICD-10-CM | POA: Diagnosis not present

## 2017-08-20 DIAGNOSIS — G2581 Restless legs syndrome: Secondary | ICD-10-CM

## 2017-08-20 DIAGNOSIS — I5042 Chronic combined systolic (congestive) and diastolic (congestive) heart failure: Secondary | ICD-10-CM

## 2017-08-20 DIAGNOSIS — K2100 Gastro-esophageal reflux disease with esophagitis, without bleeding: Secondary | ICD-10-CM

## 2017-08-20 DIAGNOSIS — E039 Hypothyroidism, unspecified: Secondary | ICD-10-CM | POA: Diagnosis not present

## 2017-08-20 DIAGNOSIS — G4700 Insomnia, unspecified: Secondary | ICD-10-CM

## 2017-08-20 DIAGNOSIS — G8929 Other chronic pain: Secondary | ICD-10-CM

## 2017-08-20 NOTE — Progress Notes (Signed)
Location:  [facility] Room Number: 9 Place of Service:  SNF (31) Provider: Dinah Ngetich FNP-C   Blanchie Serve, MD  Patient Care Team: Blanchie Serve, MD as PCP - General (Internal Medicine) Croitoru, Dani Gobble, MD as Attending Physician (Cardiology) Vevelyn Royals, MD as Consulting Physician (Ophthalmology) Franchot Gallo, MD as Consulting Physician (Urology) Tanda Rockers, MD as Consulting Physician (Pulmonary Disease) Allyn Kenner, MD (Dermatology) Deliah Goody, PA-C as Physician Assistant (Physician Assistant) Ngetich, Nelda Bucks, NP as Nurse Practitioner (Family Medicine)  Extended Emergency Contact Information Primary Emergency Contact: Midkiff,[name] Address: [address], [place] Phone: [phone] Mobile Phone: [phone] Relation: Spouse Secondary Emergency Contact: [name],Parfait  [place] Mobile Phone: [phone] Relation: Daughter  Code Status:  Full Code  Goals of care: Advanced Directive information Advanced Directives 08/20/2017  Does Patient Have a Medical Advance Directive? Yes  Type of Paramedic of [place];Living will  Does patient want to make changes to medical advance directive? -  Copy of [place] in Chart? Yes  Would patient like information on creating a medical advance directive? -  Pre-existing out of facility DNR order (yellow form or pink MOST form) -     Chief Complaint  Patient presents with  . Medical Management of Chronic Issues    HPI:  Pt is a 84 y.o. male seen today [place] for medical management of chronic diseases.He has a medical history of combined systolic and diastolic CHF,CAD,SSS,TIA,GERD,Hypothyroidism,OA,Restless legs syndrome among other conditions.He is seen in his room today with wife and facility Nurse supervisor present at bedside.He complains of left shoulder arthritic pain  currently taking tylenol.Also states unable to sleep well at night partly due to restless leg.He denies any fever or chills.Patient's      Past Medical History:  Diagnosis Date  . Arthritis    "minor, back and sometimes knees" (12/15/2012)  . Bradycardia    AFib/SSS s/p St Jude PPM 04/12/2008  . CAD (coronary artery disease) 12/30/2014   CABG (LIMA-LAD, SVG-RCA, SVG-OM in 1996).  07/2009 BMS to SVG-RCA. Cath in 04/2010 with patent stents   . Cardiomyopathy, ischemic 08/25/2012  . CHF (congestive heart failure) ([place])   . Chronic knee pain 12/03/2014  . Combined congestive systolic and diastolic heart failure ([place]) 02/02/2015   Hx EF 41%. BNP 96.8 02/21/15 Torsemide 04/06/15 Na 142, K 4.6, Bun 16, creat 0.89 04/20/14 BNP 111.7, Na 142, K 4.6, Bun 16, creat 0.9   . Depression with anxiety 02/02/2015   02/21/15 Hgb A1c 5.8 03/10/15 MMSE 30/30   . Dizziness, after diuretic asscoiated with hypotension and responded to fluid bolus 06/05/2011   04/28/15 US carotid R+L normal bilateral arterial velocities.    Marland Kitchen Dyspnea 08/11/2014   Followed in Pulmonary clinic/ [facility]/ [place]  - 08/11/2014  Walked RA x 1 laps @ 185 ft each stopped due to fatigue/off balance/ slow pace/  no sob or desat  - PFT's  09/26/2014  FEV1 2.26 (85 % ) ratio 76  p no % improvement from saba with DLCO  67 % corrects to 93 % for alv volume      Since prev study 08/04/13 minimal change lung vol or dlco    . Embolic cerebral infarction ([place]) 12/06/2015  . Exertional shortness of breath    "sometimes walking" (12/15/2012)  . GERD (gastroesophageal reflux disease)   . Gout 02/09/2015  .  Heart murmur    "just told I had one today" (12/15/2012)  . Hiatal hernia   . Hyperlipidemia   . Hypertension   . Hypothyroid   . Influenza A 03/10/2016  . Insomnia   . Melanoma of back ([place]) 1976  . Myocardial infarction [facility]) 1996; 2011   "both silent" (12/15/2012)  . Nonrheumatic aortic valve stenosis   . Orthostatic hypotension   . Osteoporosis,  senile   . Pacemaker   . RBBB   . Restless leg 02/02/2015  . Right leg weakness 12/06/2015  . [facility] spotted fever   . S/P CABG x 4   . Sick sinus syndrome ([place]) 01/31/2014  . Sustained ventricular tachycardia ([place]) 07/27/2014  . Urinary retention 12/30/2014   Past Surgical History:  Procedure Laterality Date  . CARDIAC CATHETERIZATION  04/2010   LIMA to LAD patent,SVG to OM patent,no in-stnet restenosis RCA  . CATARACT EXTRACTION W/ INTRAOCULAR LENS  IMPLANT, BILATERAL Bilateral 2012  . CORONARY ANGIOPLASTY WITH STENT PLACEMENT  07/2009   bare metal stent to SVG to the RCA  . CORONARY ARTERY BYPASS GRAFT  1996   LIMA to LAD,SVG to RCA & SVG to OM  . INSERT / REPLACE / REMOVE PACEMAKER  2010  . MELANOMA EXCISION  05/1974 X2   "taken off my back" (12/15/2012)  . NM MYOVIEW LTD  06/2011   low risk  . PPM GENERATOR CHANGEOUT N/A 01/22/2017   Procedure: PPM GENERATOR CHANGEOUT;  Surgeon: [name], MD;  Location: [facility] CV LAB;  Service: Cardiovascular;  Laterality: N/A;  . TONSILLECTOMY  1938  . TRANSURETHRAL RESECTION OF PROSTATE  1986  . US ECHOCARDIOGRAPHY  07/11/2009   EF 45-50%    Allergies  Allergen Reactions  . Altace [Ramipril] Cough  . Crestor [Rosuvastatin Calcium] Rash  . Penicillins Rash and Other (See Comments)    Has patient had a PCN reaction causing immediate rash, facial/tongue/throat swelling, SOB or lightheadedness with hypotension: Yes Has patient had a PCN reaction causing severe rash involving mucus membranes or skin necrosis: No Has patient had a PCN reaction that required hospitalization No Has patient had a PCN reaction occurring within the last 10 years: No If all of the above answers are "NO", then may proceed with Cephalosporin use.   . Sulfa Antibiotics Rash    Allergies as of 08/20/2017      Reactions   Altace [ramipril] Cough   Crestor [rosuvastatin Calcium] Rash   Penicillins Rash, Other (See Comments)   Has patient had a PCN  reaction causing immediate rash, facial/tongue/throat swelling, SOB or lightheadedness with hypotension: Yes Has patient had a PCN reaction causing severe rash involving mucus membranes or skin necrosis: No Has patient had a PCN reaction that required hospitalization No Has patient had a PCN reaction occurring within the last 10 years: No If all of the above answers are "NO", then may proceed with Cephalosporin use.   Sulfa Antibiotics Rash      Medication List        Accurate as of 08/20/17  4:32 PM. Always use your most recent med list.          acetaminophen 325 MG tablet Commonly known as:  TYLENOL Take 650 mg by mouth every 8 (eight) hours as needed for mild pain, moderate pain or headache.   amiodarone 100 MG tablet Commonly known as:  PACERONE Take 1 tablet (100 mg total) by mouth daily.   aspirin EC 81 MG tablet Take 1 tablet (  81 mg total) by mouth daily.   clopidogrel 75 MG tablet Commonly known as:  PLAVIX TAKE 1 TABLET ONCE DAILY.   Droxidopa 100 MG Caps Take 100 mg by mouth 3 (three) times daily.   hydrocortisone 2.5 % rectal cream Commonly known as:  ANUSOL-HC Place 1 application rectally 2 (two) times daily.   levothyroxine 100 MCG tablet Commonly known as:  SYNTHROID, LEVOTHROID Take 100 mcg by mouth daily before breakfast.   midodrine 10 MG tablet Commonly known as:  PROAMATINE TAKE 1 TABLET THREE TIMES DAILY. TAKE LAST DOSE AT LEAST 6 HOURS BEFORE BEDTIME.   Normal Saline Flush 0.9 % Soln 100 mLs daily. Flush foley catheter in the evening and as needed. Also flush foley 2-3 times weekly   pantoprazole 40 MG tablet Commonly known as:  PROTONIX TAKE 1 TABLET BY MOUTH TWICE DAILY.   polyethylene glycol packet Commonly known as:  MIRALAX / GLYCOLAX Take 17 g by mouth daily. Hold for loose stool   pravastatin 40 MG tablet Commonly known as:  PRAVACHOL Take 1 tablet (40 mg total) by mouth at bedtime.   rOPINIRole 0.5 MG tablet Commonly known as:   REQUIP TAKE 1 TABLET 3 TIMES A DAY AS NEEDED.   sennosides-docusate sodium 8.6-50 MG tablet Commonly known as:  SENOKOT-S Take 2 tablets by mouth at bedtime.   torsemide 10 MG tablet Commonly known as:  DEMADEX Take 5 mg by mouth daily.   traZODone 50 MG tablet Commonly known as:  DESYREL Take 1 tablet (50 mg total) by mouth at bedtime.   Vitamin D3 5000 units Caps Take 5,000 Units by mouth every Monday.       Review of Systems  Constitutional: Negative for appetite change, chills, fatigue, fever and unexpected weight change.  HENT: Negative for congestion, rhinorrhea, sinus pressure, sinus pain, sneezing and sore throat.   Eyes: Negative for discharge, redness and visual disturbance.  Respiratory: Negative for cough, chest tightness, shortness of breath and wheezing.   Cardiovascular: Positive for leg swelling. Negative for chest pain and palpitations.  Gastrointestinal: Negative for abdominal distention, abdominal pain, constipation, diarrhea, nausea and vomiting.  Endocrine: Negative for cold intolerance, heat intolerance, polydipsia, polyphagia and polyuria.  Genitourinary: Negative for urgency.       Indwelling foley catheter has appointment with his urologist later today.  Musculoskeletal: Positive for arthralgias and gait problem.       Left shoulder pain   Skin: Negative for color change, pallor and rash.  Neurological: Negative for dizziness, light-headedness and headaches.  Hematological: Does not bruise/bleed easily.  Psychiatric/Behavioral: Negative for agitation, confusion and sleep disturbance. The patient is not nervous/anxious.     Immunization History  Administered Date(s) Administered  . Influenza, High Dose Seasonal PF 11/01/2014, 11/13/2016  . Influenza-Unspecified 12/05/2013, 11/05/2014, 11/16/2015  . PPD Test 03/07/2014  . Pneumococcal Conjugate-13 10/05/2009  . Pneumococcal Polysaccharide-23 11/05/2005  . Pneumococcal-Unspecified 10/05/2009  .  Tdap 08/02/2014  . Zoster 11/05/2005   Pertinent  Health Maintenance Due  Topic Date Due  . PNA vac Low Risk Adult (2 of 2 - PCV13) 10/06/2010  . INFLUENZA VACCINE  09/04/2017  . DEXA SCAN  12/14/2024   Fall Risk  10/02/2016 10/30/2015 09/05/2015 02/02/2015  Falls in the past year? No No No No  Risk for fall due to : - Impaired balance/gait - -   Functional Status Survey:    Vitals:   08/20/17 1116  BP: 134/87  Pulse: 72  Resp: 16  Temp: 99.2 F (  37.3 C)  SpO2: 95%  Weight: 171 lb (77.6 kg)  Height: 5\' 10"  (1.778 m)   Body mass index is 24.54 kg/m. Physical Exam  Constitutional: He is oriented to person, place, and time. He appears well-developed.  Elderly in no acute distress   HENT:  Head: Normocephalic.  Right Ear: External ear normal.  Left Ear: External ear normal.  Mouth/Throat: Oropharynx is clear and moist. No oropharyngeal exudate.  Eyes: Pupils are equal, round, and reactive to light. Conjunctivae and EOM are normal. Right eye exhibits no discharge. Left eye exhibits no discharge. No scleral icterus.  Neck: Normal range of motion. No JVD present. No thyromegaly present.  Cardiovascular: Intact distal pulses. Exam reveals no gallop and no friction rub.  Murmur heard. Pulmonary/Chest: Effort normal and breath sounds normal. No respiratory distress. He has no wheezes. He has no rales.  Abdominal: Soft. Bowel sounds are normal. He exhibits no distension and no mass. There is no tenderness. There is no guarding.  Genitourinary:  Genitourinary Comments: Indwelling foley draining adequate yellow urine.   Musculoskeletal: He exhibits no tenderness.  Moves x 4 extremities except left shoulder limited due to arthritis pain.bilateral lower extremities edema 1+ knee high ted hose in place  Lymphadenopathy:    He has no cervical adenopathy.  Neurological: He is oriented to person, place, and time. Gait abnormal.  Skin: Skin is warm and dry. No rash noted. No erythema. No  pallor.  Psychiatric: He has a normal mood and affect. His speech is normal and behavior is normal. Judgment and thought content normal.  Nursing note and vitals reviewed.   Labs reviewed: Recent Labs    01/16/17 1436  04/07/17 2358 05/05/17 0000 07/16/17 1856 08/11/17  NA 142   < > 138 140 141 140  K 3.8   < > 4.1 4.6 3.5 4.1  CL 103   < > 104 103 100*  --   CO2 27  --  26 32  --   --   GLUCOSE 105*   < > 97 90 98  --   BUN 13   < > 15 10 9 15   CREATININE 0.91   < > 0.93 0.80 0.80 0.75  CALCIUM 8.8  --  9.0 8.9  --  8.8   < > = values in this interval not displayed.   Recent Labs    01/01/17 0000 05/05/17 0000  AST 29 28  ALT 25 22  BILITOT 0.4 0.5  PROT 6.3 6.5   Recent Labs    09/17/16 1517  01/16/17 1436 03/18/17 2026 03/18/17 2113 04/07/17 2358 07/16/17 1856  WBC 6.1   < > 6.4 5.9  --  8.6  --   NEUTROABS 4.0  --   --   --   --  6.2  --   HGB 13.4   < > 13.6 13.4 13.9 13.7 13.6  HCT 40.5   < > 40.3 40.4 41.0 41.1 40.0  MCV 98.6   < > 95 97.3  --  97.4  --   PLT 228.0   < > 261 224  --  230  --    < > = values in this interval not displayed.   Lab Results  Component Value Date   TSH 0.33 (L) 07/30/2017   Lab Results  Component Value Date   HGBA1C 5.6 12/07/2015   Lab Results  Component Value Date   CHOL 103 05/05/2017   HDL 39 (L) 05/05/2017   LDLCALC  43 05/05/2017   TRIG 129 05/05/2017   CHOLHDL 2.6 05/05/2017    Significant Diagnostic Results in last 30 days:  No results found.  Assessment/Plan 1. Chronic left shoulder pain Has limited ROM to shoulder.continue Tylenol 650 mg tablet every 8 hour as needed.Add lidocaine 4% patch apply to left shoulder daily for pain remove after 12 hours.continue to monitor.  2. Chronic combined systolic and diastolic congestive heart failure No abrupt weight gain.bilateral lower extremities 1+ edema.continue on Torsemide 5 mg tablet daily.continue to monitor weight.  3. Gastroesophageal reflux disease  with esophagitis Asymptomatic.continue on Protonix 40 mg tablet daily.  4. RLS (restless legs syndrome) Continue on Ropinirole 0.5 mg tablet at bedtime.encourage leg exercises and walking during the day.   5. Hypothyroidism Lab Results  Component Value Date   TSH 0.33 (L) 07/30/2017  Now on levothyroxine 100 mcg tablet daily before breakfast dose adjusted by MD on 07/30/2017 visit.monitor TSH level.  6. Neurogenic bladder Indwelling foley straight drain to leg bag draining adequate amounts of urine.last seen by urologist 07/30/2017 patient for Foley catheter change every 3 weeks to prevent encrustation.Has urology appointment this afternoon.continue to monitor.   Family/ staff Communication: Reviewed plan of care with Patient's wife,patient and facility Nurse supervisor   Labs/tests ordered: None   Sandrea Hughs, NP

## 2017-08-21 LAB — TSH
TSH: 0.74
TSH: 0.74 (ref 0.41–5.90)

## 2017-08-22 ENCOUNTER — Encounter: Payer: Self-pay | Admitting: *Deleted

## 2017-08-25 ENCOUNTER — Encounter: Payer: Self-pay | Admitting: Internal Medicine

## 2017-08-25 ENCOUNTER — Encounter (HOSPITAL_COMMUNITY): Payer: Self-pay | Admitting: Radiology

## 2017-08-25 ENCOUNTER — Inpatient Hospital Stay (HOSPITAL_COMMUNITY)
Admission: EM | Admit: 2017-08-25 | Discharge: 2017-09-04 | DRG: 699 | Disposition: A | Discharge status: Skilled Nursing Facility | Payer: Medicare Other | Source: Skilled Nursing Facility | Attending: Internal Medicine | Admitting: Internal Medicine

## 2017-08-25 ENCOUNTER — Non-Acute Institutional Stay (SKILLED_NURSING_FACILITY): Payer: Medicare Other | Admitting: Internal Medicine

## 2017-08-25 ENCOUNTER — Inpatient Hospital Stay (HOSPITAL_COMMUNITY): Payer: Medicare Other

## 2017-08-25 ENCOUNTER — Emergency Department (HOSPITAL_COMMUNITY): Payer: Medicare Other

## 2017-08-25 ENCOUNTER — Other Ambulatory Visit: Payer: Self-pay

## 2017-08-25 DIAGNOSIS — Z882 Allergy status to sulfonamides status: Secondary | ICD-10-CM

## 2017-08-25 DIAGNOSIS — Z9842 Cataract extraction status, left eye: Secondary | ICD-10-CM

## 2017-08-25 DIAGNOSIS — W07XXXA Fall from chair, initial encounter: Secondary | ICD-10-CM | POA: Diagnosis present

## 2017-08-25 DIAGNOSIS — N319 Neuromuscular dysfunction of bladder, unspecified: Secondary | ICD-10-CM

## 2017-08-25 DIAGNOSIS — F418 Other specified anxiety disorders: Secondary | ICD-10-CM | POA: Diagnosis present

## 2017-08-25 DIAGNOSIS — I5042 Chronic combined systolic (congestive) and diastolic (congestive) heart failure: Secondary | ICD-10-CM

## 2017-08-25 DIAGNOSIS — G8929 Other chronic pain: Secondary | ICD-10-CM | POA: Diagnosis present

## 2017-08-25 DIAGNOSIS — B961 Klebsiella pneumoniae [K. pneumoniae] as the cause of diseases classified elsewhere: Secondary | ICD-10-CM | POA: Diagnosis present

## 2017-08-25 DIAGNOSIS — G2581 Restless legs syndrome: Secondary | ICD-10-CM | POA: Diagnosis present

## 2017-08-25 DIAGNOSIS — I495 Sick sinus syndrome: Secondary | ICD-10-CM | POA: Diagnosis not present

## 2017-08-25 DIAGNOSIS — E860 Dehydration: Secondary | ICD-10-CM | POA: Diagnosis present

## 2017-08-25 DIAGNOSIS — Z515 Encounter for palliative care: Secondary | ICD-10-CM | POA: Diagnosis not present

## 2017-08-25 DIAGNOSIS — M25569 Pain in unspecified knee: Secondary | ICD-10-CM | POA: Diagnosis present

## 2017-08-25 DIAGNOSIS — R4701 Aphasia: Secondary | ICD-10-CM | POA: Diagnosis not present

## 2017-08-25 DIAGNOSIS — T83511A Infection and inflammatory reaction due to indwelling urethral catheter, initial encounter: Secondary | ICD-10-CM | POA: Diagnosis present

## 2017-08-25 DIAGNOSIS — Z7902 Long term (current) use of antithrombotics/antiplatelets: Secondary | ICD-10-CM

## 2017-08-25 DIAGNOSIS — Z85820 Personal history of malignant melanoma of skin: Secondary | ICD-10-CM

## 2017-08-25 DIAGNOSIS — I350 Nonrheumatic aortic (valve) stenosis: Secondary | ICD-10-CM | POA: Diagnosis present

## 2017-08-25 DIAGNOSIS — I361 Nonrheumatic tricuspid (valve) insufficiency: Secondary | ICD-10-CM | POA: Diagnosis not present

## 2017-08-25 DIAGNOSIS — J029 Acute pharyngitis, unspecified: Secondary | ICD-10-CM | POA: Diagnosis not present

## 2017-08-25 DIAGNOSIS — M109 Gout, unspecified: Secondary | ICD-10-CM | POA: Diagnosis present

## 2017-08-25 DIAGNOSIS — Z880 Allergy status to penicillin: Secondary | ICD-10-CM | POA: Diagnosis not present

## 2017-08-25 DIAGNOSIS — M542 Cervicalgia: Secondary | ICD-10-CM

## 2017-08-25 DIAGNOSIS — I472 Ventricular tachycardia: Secondary | ICD-10-CM | POA: Diagnosis present

## 2017-08-25 DIAGNOSIS — K219 Gastro-esophageal reflux disease without esophagitis: Secondary | ICD-10-CM | POA: Diagnosis present

## 2017-08-25 DIAGNOSIS — M791 Myalgia, unspecified site: Secondary | ICD-10-CM | POA: Diagnosis not present

## 2017-08-25 DIAGNOSIS — R252 Cramp and spasm: Secondary | ICD-10-CM

## 2017-08-25 DIAGNOSIS — Z79899 Other long term (current) drug therapy: Secondary | ICD-10-CM

## 2017-08-25 DIAGNOSIS — M25512 Pain in left shoulder: Secondary | ICD-10-CM

## 2017-08-25 DIAGNOSIS — I2510 Atherosclerotic heart disease of native coronary artery without angina pectoris: Secondary | ICD-10-CM | POA: Diagnosis present

## 2017-08-25 DIAGNOSIS — E46 Unspecified protein-calorie malnutrition: Secondary | ICD-10-CM | POA: Diagnosis present

## 2017-08-25 DIAGNOSIS — E039 Hypothyroidism, unspecified: Secondary | ICD-10-CM

## 2017-08-25 DIAGNOSIS — Z833 Family history of diabetes mellitus: Secondary | ICD-10-CM

## 2017-08-25 DIAGNOSIS — R339 Retention of urine, unspecified: Secondary | ICD-10-CM

## 2017-08-25 DIAGNOSIS — Z950 Presence of cardiac pacemaker: Secondary | ICD-10-CM | POA: Diagnosis present

## 2017-08-25 DIAGNOSIS — Z888 Allergy status to other drugs, medicaments and biological substances status: Secondary | ICD-10-CM

## 2017-08-25 DIAGNOSIS — K210 Gastro-esophageal reflux disease with esophagitis: Secondary | ICD-10-CM

## 2017-08-25 DIAGNOSIS — M436 Torticollis: Secondary | ICD-10-CM | POA: Diagnosis present

## 2017-08-25 DIAGNOSIS — Z951 Presence of aortocoronary bypass graft: Secondary | ICD-10-CM

## 2017-08-25 DIAGNOSIS — Z8673 Personal history of transient ischemic attack (TIA), and cerebral infarction without residual deficits: Secondary | ICD-10-CM | POA: Diagnosis not present

## 2017-08-25 DIAGNOSIS — E7800 Pure hypercholesterolemia, unspecified: Secondary | ICD-10-CM | POA: Diagnosis present

## 2017-08-25 DIAGNOSIS — N390 Urinary tract infection, site not specified: Secondary | ICD-10-CM

## 2017-08-25 DIAGNOSIS — R7881 Bacteremia: Secondary | ICD-10-CM

## 2017-08-25 DIAGNOSIS — I7389 Other specified peripheral vascular diseases: Secondary | ICD-10-CM | POA: Diagnosis present

## 2017-08-25 DIAGNOSIS — Z87440 Personal history of urinary (tract) infections: Secondary | ICD-10-CM

## 2017-08-25 DIAGNOSIS — G4700 Insomnia, unspecified: Secondary | ICD-10-CM | POA: Diagnosis present

## 2017-08-25 DIAGNOSIS — I700 Atherosclerosis of aorta: Secondary | ICD-10-CM | POA: Diagnosis not present

## 2017-08-25 DIAGNOSIS — I4891 Unspecified atrial fibrillation: Secondary | ICD-10-CM | POA: Diagnosis present

## 2017-08-25 DIAGNOSIS — R5381 Other malaise: Secondary | ICD-10-CM

## 2017-08-25 DIAGNOSIS — I69320 Aphasia following cerebral infarction: Secondary | ICD-10-CM | POA: Diagnosis not present

## 2017-08-25 DIAGNOSIS — I110 Hypertensive heart disease with heart failure: Secondary | ICD-10-CM | POA: Diagnosis present

## 2017-08-25 DIAGNOSIS — I255 Ischemic cardiomyopathy: Secondary | ICD-10-CM

## 2017-08-25 DIAGNOSIS — I4510 Unspecified right bundle-branch block: Secondary | ICD-10-CM | POA: Diagnosis present

## 2017-08-25 DIAGNOSIS — E782 Mixed hyperlipidemia: Secondary | ICD-10-CM | POA: Diagnosis not present

## 2017-08-25 DIAGNOSIS — E785 Hyperlipidemia, unspecified: Secondary | ICD-10-CM | POA: Diagnosis present

## 2017-08-25 DIAGNOSIS — I252 Old myocardial infarction: Secondary | ICD-10-CM

## 2017-08-25 DIAGNOSIS — Y846 Urinary catheterization as the cause of abnormal reaction of the patient, or of later complication, without mention of misadventure at the time of the procedure: Secondary | ICD-10-CM | POA: Diagnosis present

## 2017-08-25 DIAGNOSIS — R52 Pain, unspecified: Secondary | ICD-10-CM

## 2017-08-25 DIAGNOSIS — Z79890 Hormone replacement therapy (postmenopausal): Secondary | ICD-10-CM

## 2017-08-25 DIAGNOSIS — W19XXXA Unspecified fall, initial encounter: Secondary | ICD-10-CM | POA: Diagnosis not present

## 2017-08-25 DIAGNOSIS — Z7982 Long term (current) use of aspirin: Secondary | ICD-10-CM

## 2017-08-25 DIAGNOSIS — M25511 Pain in right shoulder: Secondary | ICD-10-CM

## 2017-08-25 DIAGNOSIS — L89152 Pressure ulcer of sacral region, stage 2: Secondary | ICD-10-CM | POA: Diagnosis not present

## 2017-08-25 DIAGNOSIS — G903 Multi-system degeneration of the autonomic nervous system: Secondary | ICD-10-CM | POA: Diagnosis present

## 2017-08-25 DIAGNOSIS — B9620 Unspecified Escherichia coli [E. coli] as the cause of diseases classified elsewhere: Secondary | ICD-10-CM | POA: Diagnosis present

## 2017-08-25 DIAGNOSIS — I5040 Unspecified combined systolic (congestive) and diastolic (congestive) heart failure: Secondary | ICD-10-CM | POA: Diagnosis present

## 2017-08-25 DIAGNOSIS — B9689 Other specified bacterial agents as the cause of diseases classified elsewhere: Secondary | ICD-10-CM | POA: Diagnosis present

## 2017-08-25 DIAGNOSIS — R251 Tremor, unspecified: Secondary | ICD-10-CM | POA: Diagnosis present

## 2017-08-25 DIAGNOSIS — Z960 Presence of urogenital implants: Secondary | ICD-10-CM | POA: Diagnosis not present

## 2017-08-25 DIAGNOSIS — Z8249 Family history of ischemic heart disease and other diseases of the circulatory system: Secondary | ICD-10-CM

## 2017-08-25 DIAGNOSIS — M4312 Spondylolisthesis, cervical region: Secondary | ICD-10-CM | POA: Diagnosis present

## 2017-08-25 DIAGNOSIS — Z6824 Body mass index (BMI) 24.0-24.9, adult: Secondary | ICD-10-CM

## 2017-08-25 DIAGNOSIS — Z961 Presence of intraocular lens: Secondary | ICD-10-CM | POA: Diagnosis present

## 2017-08-25 DIAGNOSIS — Z955 Presence of coronary angioplasty implant and graft: Secondary | ICD-10-CM

## 2017-08-25 DIAGNOSIS — M810 Age-related osteoporosis without current pathological fracture: Secondary | ICD-10-CM | POA: Diagnosis present

## 2017-08-25 DIAGNOSIS — R531 Weakness: Secondary | ICD-10-CM

## 2017-08-25 DIAGNOSIS — Z801 Family history of malignant neoplasm of trachea, bronchus and lung: Secondary | ICD-10-CM

## 2017-08-25 DIAGNOSIS — Z9841 Cataract extraction status, right eye: Secondary | ICD-10-CM

## 2017-08-25 DIAGNOSIS — R011 Cardiac murmur, unspecified: Secondary | ICD-10-CM | POA: Diagnosis not present

## 2017-08-25 DIAGNOSIS — R1313 Dysphagia, pharyngeal phase: Secondary | ICD-10-CM | POA: Diagnosis present

## 2017-08-25 LAB — COMPREHENSIVE METABOLIC PANEL
ALT: 19 U/L (ref 0–44)
AST: 23 U/L (ref 15–41)
Albumin: 3.1 g/dL — ABNORMAL LOW (ref 3.5–5.0)
Alkaline Phosphatase: 65 U/L (ref 38–126)
Anion gap: 9 (ref 5–15)
BUN: 13 mg/dL (ref 8–23)
CO2: 25 mmol/L (ref 22–32)
Calcium: 8.6 mg/dL — ABNORMAL LOW (ref 8.9–10.3)
Chloride: 106 mmol/L (ref 98–111)
Creatinine, Ser: 0.56 mg/dL — ABNORMAL LOW (ref 0.61–1.24)
GFR calc Af Amer: >60 mL/min (ref >60)
GFR calc non Af Amer: >60 mL/min (ref >60)
Glucose, Bld: 108 mg/dL — ABNORMAL HIGH (ref 70–99)
Potassium: 3.9 mmol/L (ref 3.5–5.1)
Sodium: 140 mmol/L (ref 135–145)
Total Bilirubin: 0.5 mg/dL (ref 0.3–1.2)
Total Protein: 6.8 g/dL (ref 6.5–8.1)

## 2017-08-25 LAB — URINALYSIS, ROUTINE W REFLEX MICROSCOPIC
Bilirubin Urine: NEGATIVE
Glucose, UA: NEGATIVE mg/dL
Ketones, ur: NEGATIVE mg/dL
Nitrite: POSITIVE — AB
Protein, ur: NEGATIVE mg/dL
Specific Gravity, Urine: 1.015 (ref 1.005–1.030)
WBC, UA: >50 WBC/hpf — ABNORMAL HIGH (ref 0–5)
pH: 7 (ref 5.0–8.0)

## 2017-08-25 LAB — CBC
HCT: 40.3 % (ref 39.0–52.0)
Hemoglobin: 13.3 g/dL (ref 13.0–17.0)
MCH: 32.2 pg (ref 26.0–34.0)
MCHC: 33 g/dL (ref 30.0–36.0)
MCV: 97.6 fL (ref 78.0–100.0)
Platelets: 206 10*3/uL (ref 150–400)
RBC: 4.13 MIL/uL — ABNORMAL LOW (ref 4.22–5.81)
RDW: 14.8 % (ref 11.5–15.5)
WBC: 7 10*3/uL (ref 4.0–10.5)

## 2017-08-25 LAB — SEDIMENTATION RATE: Sed Rate: 46 mm/hr — ABNORMAL HIGH (ref 0–16)

## 2017-08-25 LAB — I-STAT TROPONIN, ED: Troponin i, poc: 0 ng/mL (ref 0.00–0.08)

## 2017-08-25 LAB — BRAIN NATRIURETIC PEPTIDE: B Natriuretic Peptide: 378.4 pg/mL — ABNORMAL HIGH (ref 0.0–100.0)

## 2017-08-25 LAB — MRSA PCR SCREENING: MRSA by PCR: POSITIVE — AB

## 2017-08-25 LAB — C-REACTIVE PROTEIN: CRP: 13.6 mg/dL — ABNORMAL HIGH (ref <1.0)

## 2017-08-25 MED ORDER — ACETAMINOPHEN 325 MG PO TABS
650.0000 mg | ORAL_TABLET | Freq: Three times a day (TID) | ORAL | Status: DC | PRN
  Administered 2017-08-25: 650 mg via ORAL
  Filled 2017-08-25: qty 2

## 2017-08-25 MED ORDER — CEFTRIAXONE SODIUM 1 G IJ SOLR
1.0000 g | Freq: Once | INTRAMUSCULAR | Status: AC
  Administered 2017-08-25: 1 g via INTRAVENOUS
  Filled 2017-08-25: qty 10

## 2017-08-25 MED ORDER — TRAZODONE HCL 50 MG PO TABS
50.0000 mg | ORAL_TABLET | Freq: Every day | ORAL | Status: DC
  Administered 2017-08-25 – 2017-08-26 (×2): 50 mg via ORAL
  Filled 2017-08-25 (×2): qty 1

## 2017-08-25 MED ORDER — PREDNISONE 20 MG PO TABS
40.0000 mg | ORAL_TABLET | Freq: Every day | ORAL | Status: DC
  Administered 2017-08-26: 40 mg via ORAL
  Filled 2017-08-25: qty 2

## 2017-08-25 MED ORDER — MELATONIN 3 MG PO TABS
3.0000 mg | ORAL_TABLET | Freq: Every day | ORAL | Status: DC
  Administered 2017-08-25 – 2017-08-26 (×2): 3 mg via ORAL
  Filled 2017-08-25 (×4): qty 1

## 2017-08-25 MED ORDER — SODIUM CHLORIDE 0.9 % IV SOLN
1.0000 g | INTRAVENOUS | Status: DC
  Administered 2017-08-26 – 2017-08-28 (×3): 1 g via INTRAVENOUS
  Filled 2017-08-25: qty 1
  Filled 2017-08-25: qty 10
  Filled 2017-08-25 (×2): qty 1

## 2017-08-25 MED ORDER — ONDANSETRON HCL 4 MG/2ML IJ SOLN: 4.0000 mg | Freq: Four times a day (QID) | INTRAMUSCULAR | Status: DC | PRN

## 2017-08-25 MED ORDER — SODIUM CHLORIDE 0.9 % IV SOLN
INTRAVENOUS | Status: DC
  Administered 2017-08-25: 19:00:00 via INTRAVENOUS

## 2017-08-25 MED ORDER — ASPIRIN EC 81 MG PO TBEC
81.0000 mg | DELAYED_RELEASE_TABLET | Freq: Every day | ORAL | Status: DC
  Administered 2017-08-26 – 2017-08-27 (×2): 81 mg via ORAL
  Filled 2017-08-25 (×2): qty 1

## 2017-08-25 MED ORDER — HEPARIN SODIUM (PORCINE) 5000 UNIT/ML IJ SOLN
5000.0000 [IU] | Freq: Three times a day (TID) | INTRAMUSCULAR | Status: DC
  Administered 2017-08-25 – 2017-09-04 (×28): 5000 [IU] via SUBCUTANEOUS
  Filled 2017-08-25 (×29): qty 1

## 2017-08-25 MED ORDER — CLOPIDOGREL BISULFATE 75 MG PO TABS
75.0000 mg | ORAL_TABLET | Freq: Every day | ORAL | Status: DC
  Administered 2017-08-26 – 2017-08-27 (×2): 75 mg via ORAL
  Filled 2017-08-25 (×2): qty 1

## 2017-08-25 MED ORDER — ONDANSETRON HCL 4 MG PO TABS: 4.0000 mg | ORAL_TABLET | Freq: Four times a day (QID) | ORAL | Status: DC | PRN

## 2017-08-25 MED ORDER — LEVOTHYROXINE SODIUM 100 MCG PO TABS
100.0000 ug | ORAL_TABLET | Freq: Every day | ORAL | Status: DC
  Administered 2017-08-26 – 2017-08-27 (×2): 100 ug via ORAL
  Filled 2017-08-25 (×2): qty 1

## 2017-08-25 MED ORDER — HYDROMORPHONE HCL 1 MG/ML IJ SOLN
0.5000 mg | Freq: Once | INTRAMUSCULAR | Status: AC
  Administered 2017-08-25: 0.5 mg via INTRAVENOUS
  Filled 2017-08-25: qty 1

## 2017-08-25 MED ORDER — DROXIDOPA 100 MG PO CAPS
100.0000 mg | ORAL_CAPSULE | Freq: Three times a day (TID) | ORAL | Status: DC
  Filled 2017-08-25: qty 1

## 2017-08-25 MED ORDER — PANTOPRAZOLE SODIUM 40 MG PO TBEC
40.0000 mg | DELAYED_RELEASE_TABLET | Freq: Two times a day (BID) | ORAL | Status: DC
  Administered 2017-08-25 – 2017-08-27 (×4): 40 mg via ORAL
  Filled 2017-08-25 (×4): qty 1

## 2017-08-25 MED ORDER — PRAVASTATIN SODIUM 40 MG PO TABS
40.0000 mg | ORAL_TABLET | Freq: Every day | ORAL | Status: DC
  Administered 2017-08-25 – 2017-08-26 (×2): 40 mg via ORAL
  Filled 2017-08-25 (×2): qty 1

## 2017-08-25 MED ORDER — FENTANYL CITRATE (PF) 100 MCG/2ML IJ SOLN
25.0000 ug | INTRAMUSCULAR | Status: DC | PRN
  Administered 2017-08-25: 25 ug via INTRAVENOUS
  Filled 2017-08-25: qty 2

## 2017-08-25 MED ORDER — SODIUM CHLORIDE 0.9 % IV BOLUS
1000.0000 mL | Freq: Once | INTRAVENOUS | Status: AC
  Administered 2017-08-25: 1000 mL via INTRAVENOUS

## 2017-08-25 MED ORDER — AMIODARONE HCL 100 MG PO TABS
100.0000 mg | ORAL_TABLET | Freq: Every day | ORAL | Status: DC
  Administered 2017-08-26 – 2017-08-27 (×2): 100 mg via ORAL
  Filled 2017-08-25 (×2): qty 1

## 2017-08-25 MED ORDER — ROPINIROLE HCL 1 MG PO TABS
0.5000 mg | ORAL_TABLET | Freq: Three times a day (TID) | ORAL | Status: DC | PRN
  Administered 2017-08-25 – 2017-09-04 (×7): 0.5 mg via ORAL
  Filled 2017-08-25 (×7): qty 1

## 2017-08-25 MED ORDER — POLYETHYLENE GLYCOL 3350 17 G PO PACK
17.0000 g | PACK | Freq: Every day | ORAL | Status: DC
  Administered 2017-08-26 – 2017-08-27 (×2): 17 g via ORAL
  Filled 2017-08-25 (×2): qty 1

## 2017-08-25 MED ORDER — MIDODRINE HCL 5 MG PO TABS
10.0000 mg | ORAL_TABLET | ORAL | Status: DC
  Administered 2017-08-26 – 2017-08-27 (×5): 10 mg via ORAL
  Filled 2017-08-25 (×11): qty 2

## 2017-08-25 MED ORDER — SODIUM CHLORIDE 0.9 % IV SOLN: INTRAVENOUS | Status: DC

## 2017-08-25 NOTE — ED Triage Notes (Addendum)
Per family, patient did not fall, he slid from his Rolator to floor. Patient also has been in a recliner for 3 days due to pain, Patient reports a sore throat and pain from the waist up. Symptoms started Saturday when patient began involuntarily jerking.

## 2017-08-25 NOTE — ED Notes (Signed)
Bed: PO24 Expected date:  Expected time:  Means of arrival:  Comments: EMS-fall-pain

## 2017-08-25 NOTE — ED Triage Notes (Signed)
Patient arrives from [facility]. Patient c/o generalized pain all over, fall a few days ago- no injuries, unable to take oral meds due to being too large. Pain 7/10.   BP: 160/82 P: 80 RR: 20 CBG 125

## 2017-08-25 NOTE — Progress Notes (Signed)
Location:  [facility] Room Number: 9 Place of Service:  SNF ([phone]) Provider:  Blanchie Serve, MD  Blanchie Serve, MD  Patient Care Team: Blanchie Serve, MD as PCP - General (Internal Medicine) Croitoru, Dani Gobble, MD as Attending Physician (Cardiology) Vevelyn Royals, MD as Consulting Physician (Ophthalmology) Franchot Gallo, MD as Consulting Physician (Urology) Tanda Rockers, MD as Consulting Physician (Pulmonary Disease) Allyn Kenner, MD (Dermatology) Deliah Goody, PA-C as Physician Assistant (Physician Assistant) Ngetich, Nelda Bucks, NP as Nurse Practitioner (Family Medicine)  Extended Emergency Contact Information Primary Emergency Contact: Gilberg,[name] Address: [address], [place] Phone: [phone] Mobile Phone: [phone] Relation: Spouse Secondary Emergency Contact: [name],Exline  [place] Mobile Phone: [phone] Relation: Daughter  Code Status:  Full code  Goals of care: Advanced Directive information Advanced Directives 08/25/2017  Does Patient Have a Medical Advance Directive? Yes  Type of Paramedic of [place];Living will  Does patient want to make changes to medical advance directive? No - Patient declined  Copy of [place] in Chart? Yes  Would patient like information on creating a medical advance directive? -  Pre-existing out of facility DNR order (yellow form or pink MOST form) -     Chief Complaint  Patient presents with  . Acute Visit    acute neck, back pain, not eating or drinking, refuses to move, jerking movements 2 days back    HPI:  Pt is a 84 y.o. male seen today for an acute visit for several concerns. Patient has been having pain to his left neck region since Saturday. The pain has worsened and he grades it 10/10, involving his whole neck and shoulder and upper back region. He has been sleeping on is  recliner chair for past few days. Wife and daughter present this visit. They mention that he was sleeping in recliner in his apartment as well but had layers of cushion and padding on it which he does not have now. He had a near fall 2 days back with him sliding close to the floor and staff picked him up. No documented fall with trauma reported. He has been having sore throat and difficulty swallowing for 2 days. He has refused his medication and feed this am. Per wife, he had severe involuntary jerking movement to his arms on Saturday afternoon. According to family, they have never witnessed this severe involuntary jerks before. He is on requip for RLS and receiving a dose of it helped. No involuntary jerks this visit. Pt seen in his room with his wife, daughter and charge nurse present.    Past Medical History:  Diagnosis Date  . Arthritis    "minor, back and sometimes knees" (12/15/2012)  . Bradycardia    AFib/SSS s/p St Jude PPM 04/12/2008  . CAD (coronary artery disease) 12/30/2014   CABG (LIMA-LAD, SVG-RCA, SVG-OM in 1996).  07/2009 BMS to SVG-RCA. Cath in 04/2010 with patent stents   . Cardiomyopathy, ischemic 08/25/2012  . CHF (congestive heart failure) ([place])   . Chronic knee pain 12/03/2014  . Combined congestive systolic and diastolic heart failure ([place]) 02/02/2015   Hx EF 41%. BNP 96.8 02/21/15 Torsemide 04/06/15 Na 142, K 4.6, Bun 16, creat 0.89 04/20/14 BNP 111.7, Na 142, K 4.6, Bun 16, creat 0.9   . Depression with anxiety 02/02/2015   02/21/15 Hgb A1c 5.8 03/10/15 MMSE 30/30   . Dizziness,  after diuretic asscoiated with hypotension and responded to fluid bolus 06/05/2011   04/28/15 US carotid R+L normal bilateral arterial velocities.    Marland Kitchen Dyspnea 08/11/2014   Followed in Pulmonary clinic/ [facility]/ [facility]  - 08/11/2014  Walked RA x 1 laps @ 185 ft each stopped due to fatigue/off balance/ slow pace/  no sob or desat  - PFT's  09/26/2014  FEV1 2.26 (85 % ) ratio 76  p no % improvement from  saba with DLCO  67 % corrects to 93 % for alv volume      Since prev study 08/04/13 minimal change lung vol or dlco    . Embolic cerebral infarction ([place]) 12/06/2015  . Exertional shortness of breath    "sometimes walking" (12/15/2012)  . GERD (gastroesophageal reflux disease)   . Gout 02/09/2015  . Heart murmur    "just told I had one today" (12/15/2012)  . Hiatal hernia   . Hyperlipidemia   . Hypertension   . Hypothyroid   . Influenza A 03/10/2016  . Insomnia   . Melanoma of back ([place]) 1976  . Myocardial infarction [facility]) 1996; 2011   "both silent" (12/15/2012)  . Nonrheumatic aortic valve stenosis   . Orthostatic hypotension   . Osteoporosis, senile   . Pacemaker   . RBBB   . Restless leg 02/02/2015  . Right leg weakness 12/06/2015  . [facility] spotted fever   . S/P CABG x 4   . Sick sinus syndrome ([place]) 01/31/2014  . Sustained ventricular tachycardia ([place]) 07/27/2014  . Urinary retention 12/30/2014   Past Surgical History:  Procedure Laterality Date  . CARDIAC CATHETERIZATION  04/2010   LIMA to LAD patent,SVG to OM patent,no in-stnet restenosis RCA  . CATARACT EXTRACTION W/ INTRAOCULAR LENS  IMPLANT, BILATERAL Bilateral 2012  . CORONARY ANGIOPLASTY WITH STENT PLACEMENT  07/2009   bare metal stent to SVG to the RCA  . CORONARY ARTERY BYPASS GRAFT  1996   LIMA to LAD,SVG to RCA & SVG to OM  . INSERT / REPLACE / REMOVE PACEMAKER  2010  . MELANOMA EXCISION  05/1974 X2   "taken off my back" (12/15/2012)  . NM MYOVIEW LTD  06/2011   low risk  . PPM GENERATOR CHANGEOUT N/A 01/22/2017   Procedure: PPM GENERATOR CHANGEOUT;  Surgeon: [name], MD;  Location: [facility];  Service: Cardiovascular;  Laterality: N/A;  . TONSILLECTOMY  1938  . TRANSURETHRAL RESECTION OF PROSTATE  1986  . US ECHOCARDIOGRAPHY  07/11/2009   EF 45-50%    Allergies  Allergen Reactions  . Altace [Ramipril] Cough  . Crestor [Rosuvastatin Calcium] Rash  . Penicillins Rash and Other (See  Comments)    Has patient had a PCN reaction causing immediate rash, facial/tongue/throat swelling, SOB or lightheadedness with hypotension: Yes Has patient had a PCN reaction causing severe rash involving mucus membranes or skin necrosis: No Has patient had a PCN reaction that required hospitalization No Has patient had a PCN reaction occurring within the last 10 years: No If all of the above answers are "NO", then may proceed with Cephalosporin use.   . Sulfa Antibiotics Rash    Outpatient Encounter Medications as of 08/25/2017  Medication Sig  . acetaminophen (TYLENOL) 325 MG tablet Take 650 mg by mouth every 8 (eight) hours as needed for mild pain, moderate pain or headache.   Marland Kitchen acetaminophen (TYLENOL) 500 MG tablet Take 1,000 mg by mouth 2 (two) times daily.  Marland Kitchen amiodarone (PACERONE) 100 MG  tablet Take 1 tablet (100 mg total) by mouth daily.  Marland Kitchen aspirin EC 81 MG tablet Take 1 tablet (81 mg total) by mouth daily.  . Cholecalciferol (VITAMIN D3) 5000 UNITS CAPS Take 5,000 Units by mouth every Monday.   . clopidogrel (PLAVIX) 75 MG tablet TAKE 1 TABLET ONCE DAILY.  Marland Kitchen DM-Benzocaine-Menthol (CHLORASEPTIC TOTAL) 06-09-08 MG LOZG Use as directed 1 lozenge in the mouth or throat every 2 (two) hours as needed.  . Droxidopa 100 MG CAPS Take 100 mg by mouth 3 (three) times daily.  . hydrocortisone (ANUSOL-HC) 2.5 % rectal cream Place 1 application rectally 2 (two) times daily.  Marland Kitchen levothyroxine (SYNTHROID, LEVOTHROID) 100 MCG tablet Take 100 mcg by mouth daily before breakfast.  . Lidocaine 4 % PTCH Apply 1 patch topically daily. Left shoulder  . Melatonin 3 MG TABS Take 3 mg by mouth at bedtime.  . midodrine (PROAMATINE) 10 MG tablet TAKE 1 TABLET THREE TIMES DAILY. TAKE LAST DOSE AT LEAST 6 HOURS BEFORE BEDTIME.  . pantoprazole (PROTONIX) 40 MG tablet TAKE 1 TABLET BY MOUTH TWICE DAILY.  Marland Kitchen polyethylene glycol (MIRALAX / GLYCOLAX) packet Take 17 g by mouth daily. Hold for loose stool  . pravastatin  (PRAVACHOL) 40 MG tablet Take 1 tablet (40 mg total) by mouth at bedtime.  Marland Kitchen rOPINIRole (REQUIP) 0.5 MG tablet TAKE 1 TABLET 3 TIMES A DAY AS NEEDED.  Marland Kitchen sennosides-docusate sodium (SENOKOT-S) 8.6-50 MG tablet Take 2 tablets by mouth at bedtime.   . Sodium Chloride Flush (NORMAL SALINE FLUSH) 0.9 % SOLN 100 mLs daily. Flush foley catheter in the evening and as needed. Also flush foley 2-3 times weekly  . torsemide (DEMADEX) 10 MG tablet Take 5 mg by mouth daily.   . traZODone (DESYREL) 50 MG tablet Take 1 tablet (50 mg total) by mouth at bedtime.   No facility-administered encounter medications on file as of 08/25/2017.     Review of Systems  Constitutional: Positive for appetite change and fever. Negative for chills and diaphoresis.  HENT: Positive for sore throat and trouble swallowing. Negative for congestion, mouth sores, rhinorrhea, sinus pressure and sinus pain.   Eyes: Negative for visual disturbance.  Respiratory: Negative for cough, shortness of breath and wheezing.   Cardiovascular: Positive for chest pain and leg swelling. Negative for palpitations.  Gastrointestinal: Positive for constipation and nausea. Negative for abdominal pain, diarrhea and vomiting.  Genitourinary: Negative for hematuria.       Has foley catheter in place  Musculoskeletal: Positive for back pain, gait problem, myalgias, neck pain and neck stiffness. Negative for joint swelling.  Skin: Negative for rash.  Neurological: Negative for dizziness, speech difficulty, weakness, light-headedness, numbness and headaches.  Psychiatric/Behavioral: The patient is nervous/anxious.     Immunization History  Administered Date(s) Administered  . Influenza, High Dose Seasonal PF 11/01/2014, 11/13/2016  . Influenza-Unspecified 12/05/2013, 11/05/2014, 11/16/2015  . PPD Test 03/07/2014  . Pneumococcal Conjugate-13 10/05/2009  . Pneumococcal Polysaccharide-23 11/05/2005  . Pneumococcal-Unspecified 10/05/2009  . Tdap  08/02/2014  . Zoster 11/05/2005   Pertinent  Health Maintenance Due  Topic Date Due  . PNA vac Low Risk Adult (2 of 2 - PCV13) 10/06/2010  . INFLUENZA VACCINE  09/04/2017  . DEXA SCAN  12/14/2024   Fall Risk  10/02/2016 10/30/2015 09/05/2015 02/02/2015  Falls in the past year? No No No No  Risk for fall due to : - Impaired balance/gait - -   Functional Status Survey:    Vitals:   08/25/17 1112  BP: (!) 160/84  Pulse: 72  Resp: 16  Temp: 99.2 F (37.3 C)  TempSrc: Oral  SpO2: 95%  Weight: 172 lb 3.2 oz (78.1 kg)  Height: 5\' 10"  (1.778 m)   Body mass index is 24.71 kg/m. Physical Exam  Constitutional: He is oriented to person, place, and time. He appears well-developed and well-nourished. No distress.  HENT:  Head: Normocephalic and atraumatic.  Right Ear: External ear normal.  Left Ear: External ear normal.  Moist mucus membrane, unable to examine his oropharynx with him having trouble opening his mouth  Eyes: Pupils are equal, round, and reactive to light. Conjunctivae and EOM are normal. Right eye exhibits no discharge. Left eye exhibits no discharge.  Neck: Neck supple.  Pt having difficulty with ROM- both flexion and extension of his neck, cervical paraspinal tenderness and spasm on exam, no spinal tenderness, no nuchal rigidity  Cardiovascular: Normal rate and regular rhythm.  Pulmonary/Chest: Effort normal. No respiratory distress. He has no wheezes. He has rales. He exhibits no tenderness.  Abdominal: Soft. Bowel sounds are normal. There is no tenderness. There is no guarding.  Musculoskeletal: He exhibits edema and tenderness.  Trace leg edema, refuses to move his arms or legs saying everything hurts with movement. Reproducible chest wall tenderness. Retracts limbs to slightest touch with complain of pain  Lymphadenopathy:    He has no cervical adenopathy.  Neurological: He is alert and oriented to person, place, and time.  Skin: Skin is warm and dry. Capillary  refill takes less than 2 seconds. He is not diaphoretic.  Psychiatric:  Appears anxious     Labs reviewed: Recent Labs    01/16/17 1436  04/07/17 2358 05/05/17 0000 07/16/17 1856 08/11/17  NA 142   < > 138 140 141 140  140  K 3.8   < > 4.1 4.6 3.5 4.1  4.1  CL 103   < > 104 103 100*  --   CO2 27  --  26 32  --   --   GLUCOSE 105*   < > 97 90 98  --   BUN 13   < > 15 10 9 15   CREATININE 0.91   < > 0.93 0.80 0.80 0.8  0.75  CALCIUM 8.8  --  9.0 8.9  --  8.8   < > = values in this interval not displayed.   Recent Labs    01/01/17 0000 05/05/17 0000  AST 29 28  ALT 25 22  BILITOT 0.4 0.5  PROT 6.3 6.5   Recent Labs    09/17/16 1517  01/16/17 1436 03/18/17 2026 03/18/17 2113 04/07/17 2358 07/16/17 1856  WBC 6.1   < > 6.4 5.9  --  8.6  --   NEUTROABS 4.0  --   --   --   --  6.2  --   HGB 13.4   < > 13.6 13.4 13.9 13.7 13.6  HCT 40.5   < > 40.3 40.4 41.0 41.1 40.0  MCV 98.6   < > 95 97.3  --  97.4  --   PLT 228.0   < > 261 224  --  230  --    < > = values in this interval not displayed.   Lab Results  Component Value Date   TSH 0.74 08/21/2017   TSH 0.74 08/21/2017   Lab Results  Component Value Date   HGBA1C 5.6 12/07/2015   Lab Results  Component Value Date   CHOL 103  05/05/2017   HDL 39 (L) 05/05/2017   LDLCALC 43 05/05/2017   TRIG 129 05/05/2017   CHOLHDL 2.6 05/05/2017    Significant Diagnostic Results in last 30 days:  No results found.  Assessment/Plan  1. Bilateral neck pain Acute and severe neck pain limiting his ROM and preventing him from oral intake of feed and medication. Has had left shoulder pain for some time but pain extending to his neck with this severity is new. With recent ? Fall and worsening pain and him unable to take po medication, will need to rule out injury to cervical spine and muscle breakdown. On family's request, will send pt to ED for further evaluation.   2. Sore throat For 2 days, unable to perform oral exam.  Afebrile. No change in quality of voice. Bacterial etiology to be ruled out.   3. Jerking movements of extremities Has had occasional involuntary movement of arms and legs before and has been on requip that usually helps his symptom. Per family the event 2 days back was much severe. Concern of this contributing to some of his pain and spasm. Check lytes and CK. Another possibility is of seizure.   4. Myalgia Rule out infectious cause and lyte abnormalities.   5. Fall, initial encounter Near fall 2 days back where staff helped him get on his recliner. Concern for muscle sprain during lifting him from ground to chair. Will need to rule out rhabdomylosis.    Family/ staff Communication: reviewed care plan with patient, his wife, daughter and charge nurse. Given his inability to tolerate po intake at present, severe pain, limited mobility and his medical co-morbidities for which he wont be able to take his po medications, will send him to ED for further evaluation to rule out acute fracture, infection, seizure and electrolyte imbalance among others.   Labs/tests ordered:  Sending pt to ED.   Blanchie Serve, MD Internal Medicine [facility] [address] [place], [place] [address] Cell Phone (Monday-Friday 8 am - 5 pm): [phone] On Call: [phone] and follow prompts after 5 pm and on weekends Office Phone: [phone] Office Fax: [phone]

## 2017-08-25 NOTE — H&P (Signed)
History and Physical    Adam [name] JJK:[id number] DOB: 01-10-1927 DOA: 08/25/2017  PCP: Adam Serve, MD   Patient coming from: Home  Chief Complaint: Pain  HPI: Adam [name] is a 84 y.o. male with medical history significant of multiple comorbidities including CAD status post CABG cardiomyopathy, combined CHF, depression with anxiety, bradycardia and sick sinus syndrome status post pacer, treated embolic CVA, GERD, gout, history of heart murmur which is non-rheumatic aortic valve stenosis, RLS, Urinary Retention s/p Indwelling foley catheter who presents with a cc of generalized body pain and weakness. Patient states that the paint started in his left shoulder last week on Thursday and radiated to his right shoulder and then his lower extremities.  States that he had a poor appetite since then cannot eat or sleep and his last real meal was on Thursday.  Denies any nausea or vomiting.  Denies any chest pain or shortness breath.  States that he has been generally weak and stumbled and almost fell but was caught by 3 men.  States he does feel weaker and has had  intermittent diarrhea but none the last week.  Is at friend's home and denies any other complaints but states that he does have headaches no vision changes.  States his restless leg syndrome bothers him significantly.  Upon further work-up in the ED patient was found to have acute urinary tract infection secondary to his urinary catheter so [facility] was consulted for admission for urinary tract infection and generalized weakness.  ED Course: Had basic blood work done, given IV ceftriaxone, and a urinalysis showed an acute urinary tract infection.  He was given 1 L of normal saline bolus  Review of Systems: As per HPI otherwise 10 point review of systems negative.   Past Medical History:  Diagnosis Date  . Arthritis    "minor, back and sometimes knees" (12/15/2012)  . Bradycardia    AFib/SSS s/p St Jude PPM 04/12/2008  . CAD (coronary  artery disease) 12/30/2014   CABG (LIMA-LAD, SVG-RCA, SVG-OM in 1996).  07/2009 BMS to SVG-RCA. Cath in 04/2010 with patent stents   . Cardiomyopathy, ischemic 08/25/2012  . CHF (congestive heart failure) ([place])   . Chronic knee pain 12/03/2014  . Combined congestive systolic and diastolic heart failure ([place]) 02/02/2015   Hx EF 41%. BNP 96.8 02/21/15 Torsemide 04/06/15 Na 142, K 4.6, Bun 16, creat 0.89 04/20/14 BNP 111.7, Na 142, K 4.6, Bun 16, creat 0.9   . Depression with anxiety 02/02/2015   02/21/15 Hgb A1c 5.8 03/10/15 MMSE 30/30   . Dizziness, after diuretic asscoiated with hypotension and responded to fluid bolus 06/05/2011   04/28/15 US carotid R+L normal bilateral arterial velocities.    Marland Kitchen Dyspnea 08/11/2014   Followed in Pulmonary clinic/ [facility]/ [place]  - 08/11/2014  Walked RA x 1 laps @ 185 ft each stopped due to fatigue/off balance/ slow pace/  no sob or desat  - PFT's  09/26/2014  FEV1 2.26 (85 % ) ratio 76  p no % improvement from saba with DLCO  67 % corrects to 93 % for alv volume      Since prev study 08/04/13 minimal change lung vol or dlco    . Embolic cerebral infarction ([place]) 12/06/2015  . Exertional shortness of breath    "sometimes walking" (12/15/2012)  . GERD (gastroesophageal reflux disease)   . Gout 02/09/2015  . Heart murmur    "just told I had one today" (12/15/2012)  . Hiatal hernia   .  Hyperlipidemia   . Hypertension   . Hypothyroid   . Influenza A 03/10/2016  . Insomnia   . Melanoma of back ([place]) 1976  . Myocardial infarction [facility]) 1996; 2011   "both silent" (12/15/2012)  . Nonrheumatic aortic valve stenosis   . Orthostatic hypotension   . Osteoporosis, senile   . Pacemaker   . RBBB   . Restless leg 02/02/2015  . Right leg weakness 12/06/2015  . [facility] spotted fever   . S/P CABG x 4   . Sick sinus syndrome ([place]) 01/31/2014  . Sustained ventricular tachycardia ([place]) 07/27/2014  . Urinary retention 12/30/2014   Past Surgical History:  Procedure  Laterality Date  . CARDIAC CATHETERIZATION  04/2010   LIMA to LAD patent,SVG to OM patent,no in-stnet restenosis RCA  . CATARACT EXTRACTION W/ INTRAOCULAR LENS  IMPLANT, BILATERAL Bilateral 2012  . CORONARY ANGIOPLASTY WITH STENT PLACEMENT  07/2009   bare metal stent to SVG to the RCA  . CORONARY ARTERY BYPASS GRAFT  1996   LIMA to LAD,SVG to RCA & SVG to OM  . INSERT / REPLACE / REMOVE PACEMAKER  2010  . MELANOMA EXCISION  05/1974 X2   "taken off my back" (12/15/2012)  . NM MYOVIEW LTD  06/2011   low risk  . PPM GENERATOR CHANGEOUT N/A 01/22/2017   Procedure: PPM GENERATOR CHANGEOUT;  Surgeon: [name], MD;  Location: [place] CV LAB;  Service: Cardiovascular;  Laterality: N/A;  . TONSILLECTOMY  1938  . TRANSURETHRAL RESECTION OF PROSTATE  1986  . US ECHOCARDIOGRAPHY  07/11/2009   EF 45-50%   SOCIAL HISTORY  reports that he has never smoked. He has never used smokeless tobacco. He reports that he does not drink alcohol or use drugs.  Allergies  Allergen Reactions  . Altace [Ramipril] Cough  . Crestor [Rosuvastatin Calcium] Rash  . Penicillins Rash and Other (See Comments)    Has patient had a PCN reaction causing immediate rash, facial/tongue/throat swelling, SOB or lightheadedness with hypotension: Yes Has patient had a PCN reaction causing severe rash involving mucus membranes or skin necrosis: No Has patient had a PCN reaction that required hospitalization No Has patient had a PCN reaction occurring within the last 10 years: No If all of the above answers are "NO", then may proceed with Cephalosporin use.   . Sulfa Antibiotics Rash   Family History  Problem Relation Age of Onset  . Coronary artery disease Mother   . Diabetes Mother   . Heart disease Mother   . Coronary artery disease Father   . Diabetes Father   . Lung cancer Father   . Heart disease Father   . Anuerysm Son   . Heart disease Brother    Prior to Admission medications   Medication Sig Start Date  End Date Taking? Authorizing Provider  acetaminophen (TYLENOL) 500 MG tablet Take 1,000 mg by mouth 2 (two) times daily.   Yes [provider]  amiodarone (PACERONE) 100 MG tablet Take 1 tablet (100 mg total) by mouth daily. 01/16/17  Yes Croitoru, Mihai, MD  aspirin EC 81 MG tablet Take 1 tablet (81 mg total) by mouth daily. 01/12/16  Yes Reyne Dumas, MD  Cholecalciferol (VITAMIN D3) 5000 UNITS CAPS Take 5,000 Units by mouth every Monday.    Yes [provider]  clopidogrel (PLAVIX) 75 MG tablet TAKE 1 TABLET ONCE DAILY. 06/13/17  Yes Adam Serve, MD  Droxidopa 100 MG CAPS Take 100 mg by mouth 3 (three) times daily.  08/12/17  Yes Croitoru, Mihai, MD  levothyroxine (SYNTHROID, LEVOTHROID) 100 MCG tablet Take 100 mcg by mouth daily before breakfast.   Yes [provider]  Lidocaine 4 % PTCH Apply 1 patch topically daily. Left shoulder   Yes [provider]  Melatonin 3 MG TABS Take 3 mg by mouth at bedtime.   Yes [provider]  midodrine (PROAMATINE) 10 MG tablet TAKE 1 TABLET THREE TIMES DAILY. TAKE LAST DOSE AT LEAST 6 HOURS BEFORE BEDTIME. 06/13/17  Yes Pandey, Mahima, MD  pantoprazole (PROTONIX) 40 MG tablet TAKE 1 TABLET BY MOUTH TWICE DAILY. 02/28/17  Yes Kilroy, Luke K, PA-C  polyethylene glycol (MIRALAX / GLYCOLAX) packet Take 17 g by mouth daily. Hold for loose stool   Yes [provider]  pravastatin (PRAVACHOL) 40 MG tablet Take 1 tablet (40 mg total) by mouth at bedtime. 10/21/16  Yes Adam Serve, MD  sennosides-docusate sodium (SENOKOT-S) 8.6-50 MG tablet Take 2 tablets by mouth at bedtime.    Yes [provider]  torsemide (DEMADEX) 10 MG tablet Take 5 mg by mouth daily.    Yes [provider]  traZODone (DESYREL) 50 MG tablet Take 1 tablet (50 mg total) by mouth at bedtime. 05/19/17  Yes Adam Serve, MD  acetaminophen (TYLENOL) 325 MG tablet Take 650 mg by mouth every 8 (eight) hours as needed for mild pain,  moderate pain or headache.     [provider]  rOPINIRole (REQUIP) 0.5 MG tablet TAKE 1 TABLET 3 TIMES A DAY AS NEEDED. 07/08/17   Adam Serve, MD   Physical Exam: Vitals:   08/25/17 1700 08/25/17 1715 08/25/17 1753 08/25/17 1822  BP: (!) 150/71 (!) 152/72 (!) 145/81   Pulse: 73 71 73   Resp: 17 16 (!) 22   Temp:   98.4 F (36.9 C)   TempSrc:   Oral   SpO2: 94% 94% 94%   Weight:    77.8 kg (171 lb 8.3 oz)  Height:    _0  (1.778 m)   Constitutional: WN/WD elderly Caucasian male in NAD and appears calm  Eyes: Lids and conjunctivae normal, sclerae anicteric  ENMT: External Ears, Nose appear normal. Grossly normal hearing. Mucous membranes are slightly dry Neck: Appears normal, supple, no cervical masses, normal ROM, no appreciable thyromegaly, no JVD Respiratory: Diminished to auscultation bilaterally, no wheezing, rales, rhonchi or crackles. Normal respiratory effort and patient is not tachypenic. No accessory muscle use.  Cardiovascular: RRR, Has a 3/6 Systolic Murmur. S1 and S2 auscultated. No extremity edema.  Abdomen: Soft, non-tender, non-distended. No masses palpated. No appreciable hepatosplenomegaly. Bowel sounds positive x4  GU: Indwelling foley catheter in place.  Musculoskeletal: No clubbing / cyanosis of digits/nails. No joint deformity upper and lower extremities.  Skin: No rashes, lesions on a limited skin evaluation but has bilateral upper arm ecchymosis noted. No induration; Warm and dry. Has a sacral decubitus Neurologic: CN 2-12 grossly intact with no focal deficits except for some expressive aphasia. Romberg sign and cerebellar reflexes not assessed.  Psychiatric: Normal judgment and insight. Alert and awake and oriented. Normal mood and appropriate affect.   Labs on Admission: I have personally reviewed following labs and imaging studies  CBC: Recent Labs  Lab 08/25/17 1346  WBC 7.0  HGB 13.3  HCT 40.3  MCV 97.6  PLT 503   Basic Metabolic  Panel: Recent Labs  Lab 08/25/17 1346  NA 140  K 3.9  CL 106  CO2 25  GLUCOSE 108*  BUN 13  CREATININE 0.56*  CALCIUM 8.6*   GFR: Estimated Creatinine Clearance: 65.9 mL/min (A) (by C-G formula based on SCr of 0.56 mg/dL (L)). Liver Function Tests: Recent Labs  Lab 08/25/17 1346  AST 23  ALT 19  ALKPHOS 65  BILITOT 0.5  PROT 6.8  ALBUMIN 3.1*   No results for input(s): LIPASE, AMYLASE in the last 168 hours. No results for input(s): AMMONIA in the last 168 hours. Coagulation Profile: No results for input(s): INR, PROTIME in the last 168 hours. Cardiac Enzymes: No results for input(s): CKTOTAL, CKMB, CKMBINDEX, TROPONINI in the last 168 hours. BNP (last 3 results) Recent Labs    09/17/16 1517  PROBNP 721.0*   HbA1C: No results for input(s): HGBA1C in the last 72 hours. CBG: No results for input(s): GLUCAP in the last 168 hours. Lipid Profile: No results for input(s): CHOL, HDL, LDLCALC, TRIG, CHOLHDL, LDLDIRECT in the last 72 hours. Thyroid Function Tests: No results for input(s): TSH, T4TOTAL, FREET4, T3FREE, THYROIDAB in the last 72 hours. Anemia Panel: No results for input(s): VITAMINB12, FOLATE, FERRITIN, TIBC, IRON, RETICCTPCT in the last 72 hours. Urine analysis:    Component Value Date/Time   COLORURINE YELLOW 08/25/2017 1346   APPEARANCEUR CLOUDY (A) 08/25/2017 1346   LABSPEC 1.015 08/25/2017 1346   PHURINE 7.0 08/25/2017 1346   GLUCOSEU NEGATIVE 08/25/2017 1346   HGBUR MODERATE (A) 08/25/2017 1346   BILIRUBINUR NEGATIVE 08/25/2017 1346   KETONESUR NEGATIVE 08/25/2017 1346   PROTEINUR NEGATIVE 08/25/2017 1346   UROBILINOGEN 1.0 12/06/2014 1152   NITRITE POSITIVE (A) 08/25/2017 1346   LEUKOCYTESUR LARGE (A) 08/25/2017 1346   Sepsis Labs: !!!!!!!!!!!!!!!!!!!!!!!!!!!!!!!!!!!!!!!!!!!! _0 (procalcitonin:4,lacticidven:4) )No results found for this or any previous visit (from the past 240 hour(s)).   Radiological Exams on Admission: Dg Chest  2 View  Result Date: 08/25/2017 CLINICAL DATA:  Weakness. EXAM: CHEST - 2 VIEW COMPARISON:  11/27/2016 FINDINGS: There is a large chronic hiatal hernia. There is slight atelectasis at the lung bases but the patient has taken a very shallow inspiration. No discrete infiltrates or effusions. Heart size is normal. CABG. Pacemaker in place. No acute bone abnormality. IMPRESSION: Mild bibasilar atelectasis, at least in part due to a shallow inspiration. Large chronic hiatal hernia. Electronically Signed   By: [name] M.D.   On: 08/25/2017 13:13   Ct Head Wo Contrast  Result Date: 08/25/2017 CLINICAL DATA:  Altered mental status and possible fall. EXAM: CT HEAD WITHOUT CONTRAST CT CERVICAL SPINE WITHOUT CONTRAST TECHNIQUE: Multidetector CT imaging of the head and cervical spine was performed following the standard protocol without intravenous contrast. Multiplanar CT image reconstructions of the cervical spine were also generated. COMPARISON:  Head CT 01/11/2016 FINDINGS: CT HEAD FINDINGS Brain: There is no mass, hemorrhage or extra-axial collection. There is generalized atrophy without lobar predilection. There is no acute or chronic infarction. There is hypoattenuation of the periventricular white matter, most commonly indicating chronic ischemic microangiopathy. Vascular: Atherosclerotic calcification of the internal carotid arteries at the skull base. No abnormal hyperdensity of the major intracranial arteries or dural venous sinuses. Skull: The visualized skull base, calvarium and extracranial soft tissues are normal. Sinuses/Orbits: No fluid levels or advanced mucosal thickening of the visualized paranasal sinuses. No mastoid or middle ear effusion. The orbits are normal. CT CERVICAL SPINE FINDINGS Alignment: Grade 1 anterolisthesis at C3-C4 and C4-C5. This is secondary to facet hypertrophy. Alignment is otherwise normal. Skull base and vertebrae: No acute fracture. Soft tissues and spinal canal: No  prevertebral fluid  or swelling. No visible canal hematoma. Disc levels: Fusion of the left C3-4 facet joint. Facet arthrosis is most severe at right C4-C6. No spinal canal stenosis. Upper chest: No pneumothorax, pulmonary nodule or pleural effusion. Other: Normal visualized paraspinal cervical soft tissues. IMPRESSION: 1. Chronic ischemic microangiopathy and generalized volume loss without acute intracranial abnormality. 2. No acute fracture of the cervical spine. 3. Multilevel moderate-to-severe facet arthrosis with resultant spondylolisthesis. Electronically Signed   By: Ulyses Jarred M.D.   On: 08/25/2017 13:53   Ct Cervical Spine Wo Contrast  Result Date: 08/25/2017 CLINICAL DATA:  Altered mental status and possible fall. EXAM: CT HEAD WITHOUT CONTRAST CT CERVICAL SPINE WITHOUT CONTRAST TECHNIQUE: Multidetector CT imaging of the head and cervical spine was performed following the standard protocol without intravenous contrast. Multiplanar CT image reconstructions of the cervical spine were also generated. COMPARISON:  Head CT 01/11/2016 FINDINGS: CT HEAD FINDINGS Brain: There is no mass, hemorrhage or extra-axial collection. There is generalized atrophy without lobar predilection. There is no acute or chronic infarction. There is hypoattenuation of the periventricular white matter, most commonly indicating chronic ischemic microangiopathy. Vascular: Atherosclerotic calcification of the internal carotid arteries at the skull base. No abnormal hyperdensity of the major intracranial arteries or dural venous sinuses. Skull: The visualized skull base, calvarium and extracranial soft tissues are normal. Sinuses/Orbits: No fluid levels or advanced mucosal thickening of the visualized paranasal sinuses. No mastoid or middle ear effusion. The orbits are normal. CT CERVICAL SPINE FINDINGS Alignment: Grade 1 anterolisthesis at C3-C4 and C4-C5. This is secondary to facet hypertrophy. Alignment is otherwise normal. Skull  base and vertebrae: No acute fracture. Soft tissues and spinal canal: No prevertebral fluid or swelling. No visible canal hematoma. Disc levels: Fusion of the left C3-4 facet joint. Facet arthrosis is most severe at right C4-C6. No spinal canal stenosis. Upper chest: No pneumothorax, pulmonary nodule or pleural effusion. Other: Normal visualized paraspinal cervical soft tissues. IMPRESSION: 1. Chronic ischemic microangiopathy and generalized volume loss without acute intracranial abnormality. 2. No acute fracture of the cervical spine. 3. Multilevel moderate-to-severe facet arthrosis with resultant spondylolisthesis. Electronically Signed   By: Ulyses Jarred M.D.   On: 08/25/2017 13:53   EKG: Independently reviewed. Was a paced rhythm at a rate of 75 beats per minute.  Assessment/Plan Active Problems:   Cardiomyopathy, ischemic   Pacemaker   Hyperlipidemia   SSS (sick sinus syndrome) (HCC)   Hypothyroidism   GERD (gastroesophageal reflux disease)   Urinary retention   Coronary artery disease involving native coronary artery of native heart without angina pectoris   Expressive aphasia   RLS (restless legs syndrome)   Combined congestive systolic and diastolic heart failure (HCC)   Urinary tract infection   Hx of CABG   Nonrheumatic aortic valve stenosis   History of arterial ischemic stroke   Weak   Decubitus ulcer of sacral region, stage 2   Neurogenic orthostatic hypotension (HCC)   Neurogenic bladder   Generalized weakness   Shoulder pain, bilateral  Generalized weakness and poor p.o. intake in the setting of an acute urinary tract infection -Admit as an inpatient telemetry -Blood cultures x2 and urinalysis done with urine culture pending -Urine showed cloudy colored urine with moderate hemoglobin/large leukocytes, positive nitrites, many bacteria present, amorphous crystals, and greater than 50 WBCs and 11-20 red blood cells per high-power field -Started on empiric antibiotics with  IV ceftriaxone 2 g and will continue until sensitivities are resulted -Patient has a history of Klebsiella UTI and  likely has recurrence -Was not septic appearing on admission -Treat urinary tract infection and change Foley  -Obtain PT/OT -Gentle IV fluid hydration with normal saline at rate of 50 mL's per hour after he got 1 L bolus in the ED  Proximal shoulder pain along with generalized weakness -Concern for polymyalgia rheumatica -Check CRP and ESR -Check shoulder x-rays given recent fall -Start prednisone 40 mg and slowly taper given concern for polymyalgia rheumatica -Due to monitor and will provide pain control -If not improving will obtain further imaging possibly MRI -CT scan of the head and the neck showed Chronic ischemic microangiopathy and generalized volume loss without acute intracranial abnormality. No acute fracture of the cervical spine.  Multilevel moderate-to-severe facet arthrosis with resultant spondylolisthesis.  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 daily before breakfast  GERD -Continue with Pantoprazole 40 mg p.o. twice daily  Restless leg syndrome -Continue ropinirole 0.5 mill grams p.o. 3 times daily as needed for restless legs  History of CVA with Expressive Aphasia -Continue with Aspirin 81 mg and Clopidogrel 75 mg po Daily   Neurogenic mediated orthostatic Hypotension -Continue with droxidopa 100 mg p.o. 3 times daily and Midrin 10 mg p.o. 3 times daily  Hyperlipidemia -Continue with Pravastatin 40 mg p.o. Nightly  Sacral Decubitus -WOC Nurse   DVT prophylaxis: Heparin 5,000 units sq q8h Code Status: FULL CODE Family  Communication: Discussed with wife and daughter at bedside Disposition Plan: Pending PT/OT Evaluation  Consults called: None Admission status: Inpatient Telemetry   Severity of Illness: The appropriate patient status for this patient is INPATIENT. Inpatient status is judged to be reasonable and necessary in order to provide the required intensity of service to ensure the patient's safety. The patient's presenting symptoms, physical exam findings, and initial radiographic and laboratory data in the context of their chronic comorbidities is felt to place them at high risk for further clinical deterioration. Furthermore, it is not anticipated that the patient will be medically stable for discharge from the hospital within 2 midnights of admission. The following factors support the patient status of inpatient.   " The patient's presenting symptoms include weakness, pain, and poor appetite. " The worrisome physical exam findings include weakness. " The initial radiographic and laboratory data are worrisome because of UTI. " The chronic co-morbidities include as listed above.  * I certify that at the point of admission it is my clinical judgment that the patient will require inpatient hospital care spanning beyond 2 midnights from the point of admission due to high intensity of service, high risk for further deterioration and high frequency of surveillance required.Kerney Elbe, D.O. [facility] Pager [phone]  If 7PM-7AM, please contact night-coverage [email or web address] Password [facility]  08/25/2017, 6:40 PM

## 2017-08-25 NOTE — ED Provider Notes (Signed)
[facility] DEPT Provider Note   CSN: [id number] Arrival date & time: 08/25/17  1213     History   Chief Complaint Chief Complaint  Patient presents with  . Generalized Body Aches    HPI Adam [name] is a 84 y.o. male.  Patient c/o diffuse body pain for the past 2-3 days. Symptoms constant, persistent, mod-severe, non radiating, worse w movement and palpation. Notes recent fall from chair, denies specific injury or loc then. Pt notes very poor po intake in the past few days. Feels generally weak, too weak to walk. No fever or chills. Saw pcp w same today - no specific diagnosis - was referred to ED for evaluation. Pt denies same/similar symptoms in past.   The history is provided by the patient and the spouse.    Past Medical History:  Diagnosis Date  . Arthritis    "minor, back and sometimes knees" (12/15/2012)  . Bradycardia    AFib/SSS s/p St Jude PPM 04/12/2008  . CAD (coronary artery disease) 12/30/2014   CABG (LIMA-LAD, SVG-RCA, SVG-OM in 1996).  07/2009 BMS to SVG-RCA. Cath in 04/2010 with patent stents   . Cardiomyopathy, ischemic 08/25/2012  . CHF (congestive heart failure) ([place])   . Chronic knee pain 12/03/2014  . Combined congestive systolic and diastolic heart failure ([place]) 02/02/2015   Hx EF 41%. BNP 96.8 02/21/15 Torsemide 04/06/15 Na 142, K 4.6, Bun 16, creat 0.89 04/20/14 BNP 111.7, Na 142, K 4.6, Bun 16, creat 0.9   . Depression with anxiety 02/02/2015   02/21/15 Hgb A1c 5.8 03/10/15 MMSE 30/30   . Dizziness, after diuretic asscoiated with hypotension and responded to fluid bolus 06/05/2011   04/28/15 US carotid R+L normal bilateral arterial velocities.    Marland Kitchen Dyspnea 08/11/2014   Followed in Pulmonary clinic/ [facility]/ [place]  - 08/11/2014  Walked RA x 1 laps @ 185 ft each stopped due to fatigue/off balance/ slow pace/  no sob or desat  - PFT's  09/26/2014  FEV1 2.26 (85 % ) ratio 76  p no % improvement from saba with DLCO  67 % corrects  to 93 % for alv volume      Since prev study 08/04/13 minimal change lung vol or dlco    . Embolic cerebral infarction ([place]) 12/06/2015  . Exertional shortness of breath    "sometimes walking" (12/15/2012)  . GERD (gastroesophageal reflux disease)   . Gout 02/09/2015  . Heart murmur    "just told I had one today" (12/15/2012)  . Hiatal hernia   . Hyperlipidemia   . Hypertension   . Hypothyroid   . Influenza A 03/10/2016  . Insomnia   . Melanoma of back ([place]) 1976  . Myocardial infarction [facility]) 1996; 2011   "both silent" (12/15/2012)  . Nonrheumatic aortic valve stenosis   . Orthostatic hypotension   . Osteoporosis, senile   . Pacemaker   . RBBB   . Restless leg 02/02/2015  . Right leg weakness 12/06/2015  . [facility] spotted fever   . S/P CABG x 4   . Sick sinus syndrome ([place]) 01/31/2014  . Sustained ventricular tachycardia ([place]) 07/27/2014  . Urinary retention 12/30/2014    Patient Active Problem List   Diagnosis Date Noted  . Neurogenic bladder 07/21/2017  . Mild cognitive impairment 07/21/2017  . Neurogenic orthostatic hypotension ([place]) 04/21/2017  . Decubitus ulcer of sacral region, stage 2 06/11/2016  . Osteoarthritis 04/23/2016  . Gingivitis 04/15/2016  . Weak 03/14/2016  .  AKI (acute kidney injury) ([place]) 03/11/2016  . History of arterial ischemic stroke 03/05/2016  . Rash and nonspecific skin eruption 02/27/2016  . Nonrheumatic aortic valve stenosis   . TIA (transient ischemic attack) 01/10/2016  . Hx of CABG 01/10/2016  . Urinary tract infection 04/06/2015  . Insomnia 03/23/2015  . Gout 02/09/2015  . Depression with anxiety 02/02/2015  . RLS (restless legs syndrome) 02/02/2015  . Combined congestive systolic and diastolic heart failure ([place]) 02/02/2015  . Urinary frequency 02/02/2015  . Pressure ulcer 01/01/2015  . Urinary retention 12/30/2014  . Coronary artery disease involving native coronary artery of native heart without angina pectoris 12/30/2014  .  Expressive aphasia 12/30/2014  . Chronic constipation   . Chronic knee pain 12/03/2014  . GERD (gastroesophageal reflux disease)   . Dyspnea on exertion 08/11/2014  . Hypothyroidism 08/11/2014  . VT (ventricular tachycardia) ([place]) 07/27/2014  . Upper airway cough syndrome 07/14/2014  . SSS (sick sinus syndrome) ([place]) 01/31/2014  . Hyperlipidemia 01/30/2014  . Cardiomyopathy, ischemic 08/25/2012  . Pacemaker 08/25/2012  . Dizzy 06/05/2011  . Bradycardia     Past Surgical History:  Procedure Laterality Date  . CARDIAC CATHETERIZATION  04/2010   LIMA to LAD patent,SVG to OM patent,no in-stnet restenosis RCA  . CATARACT EXTRACTION W/ INTRAOCULAR LENS  IMPLANT, BILATERAL Bilateral 2012  . CORONARY ANGIOPLASTY WITH STENT PLACEMENT  07/2009   bare metal stent to SVG to the RCA  . CORONARY ARTERY BYPASS GRAFT  1996   LIMA to LAD,SVG to RCA & SVG to OM  . INSERT / REPLACE / REMOVE PACEMAKER  2010  . MELANOMA EXCISION  05/1974 X2   "taken off my back" (12/15/2012)  . NM MYOVIEW LTD  06/2011   low risk  . PPM GENERATOR CHANGEOUT N/A 01/22/2017   Procedure: PPM GENERATOR CHANGEOUT;  Surgeon: [name], MD;  Location: [facility];  Service: Cardiovascular;  Laterality: N/A;  . TONSILLECTOMY  1938  . TRANSURETHRAL RESECTION OF PROSTATE  1986  . US ECHOCARDIOGRAPHY  07/11/2009   EF 45-50%        Home Medications    Prior to Admission medications   Medication Sig Start Date End Date Taking? Authorizing Provider  acetaminophen (TYLENOL) 325 MG tablet Take 650 mg by mouth every 8 (eight) hours as needed for mild pain, moderate pain or headache.     [provider]  acetaminophen (TYLENOL) 500 MG tablet Take 1,000 mg by mouth 2 (two) times daily.    [provider]  amiodarone (PACERONE) 100 MG tablet Take 1 tablet (100 mg total) by mouth daily. 01/16/17   Croitoru, Mihai, MD  aspirin EC 81 MG tablet Take 1 tablet (81 mg total) by mouth daily. 01/12/16   Reyne Dumas, MD  Cholecalciferol (VITAMIN D3) 5000 UNITS CAPS Take 5,000 Units by mouth every Monday.     [provider]  clopidogrel (PLAVIX) 75 MG tablet TAKE 1 TABLET ONCE DAILY. 06/13/17   Blanchie Serve, MD  DM-Benzocaine-Menthol (CHLORASEPTIC TOTAL) 06-09-08 MG LOZG Use as directed 1 lozenge in the mouth or throat every 2 (two) hours as needed.    [provider]  Droxidopa 100 MG CAPS Take 100 mg by mouth 3 (three) times daily. 08/12/17   Croitoru, Mihai, MD  hydrocortisone (ANUSOL-HC) 2.5 % rectal cream Place 1 application rectally 2 (two) times daily.    [provider]  levothyroxine (SYNTHROID, LEVOTHROID) 100 MCG tablet Take 100 mcg by mouth daily before breakfast.  [provider]  Lidocaine 4 % PTCH Apply 1 patch topically daily. Left shoulder    [provider]  Melatonin 3 MG TABS Take 3 mg by mouth at bedtime.    [provider]  midodrine (PROAMATINE) 10 MG tablet TAKE 1 TABLET THREE TIMES DAILY. TAKE LAST DOSE AT LEAST 6 HOURS BEFORE BEDTIME. 06/13/17   Blanchie Serve, MD  pantoprazole (PROTONIX) 40 MG tablet TAKE 1 TABLET BY MOUTH TWICE DAILY. 02/28/17   Erlene Quan, PA-C  polyethylene glycol (MIRALAX / GLYCOLAX) packet Take 17 g by mouth daily. Hold for loose stool    [provider]  pravastatin (PRAVACHOL) 40 MG tablet Take 1 tablet (40 mg total) by mouth at bedtime. 10/21/16   Blanchie Serve, MD  rOPINIRole (REQUIP) 0.5 MG tablet TAKE 1 TABLET 3 TIMES A DAY AS NEEDED. 07/08/17   Blanchie Serve, MD  sennosides-docusate sodium (SENOKOT-S) 8.6-50 MG tablet Take 2 tablets by mouth at bedtime.     [provider]  Sodium Chloride Flush (NORMAL SALINE FLUSH) 0.9 % SOLN 100 mLs daily. Flush foley catheter in the evening and as needed. Also flush foley 2-3 times weekly    [provider]  torsemide (DEMADEX) 10 MG tablet Take 5 mg by mouth daily.     [provider]  traZODone (DESYREL) 50 MG tablet Take  1 tablet (50 mg total) by mouth at bedtime. 05/19/17   Blanchie Serve, MD    Family History Family History  Problem Relation Age of Onset  . Coronary artery disease Mother   . Diabetes Mother   . Heart disease Mother   . Coronary artery disease Father   . Diabetes Father   . Lung cancer Father   . Heart disease Father   . Anuerysm Son   . Heart disease Brother     Social History Social History   Tobacco Use  . Smoking status: Never Smoker  . Smokeless tobacco: Never Used  Substance Use Topics  . Alcohol use: No    Alcohol/week: 0.0 oz  . Drug use: No     Allergies   Altace [ramipril]; Crestor [rosuvastatin calcium]; Penicillins; and Sulfa antibiotics   Review of Systems Review of Systems  Constitutional: Negative for fever.  HENT: Negative for sore throat.   Eyes: Negative for visual disturbance.  Respiratory: Positive for cough. Negative for shortness of breath.   Cardiovascular: Negative for chest pain.  Gastrointestinal: Negative for abdominal pain, diarrhea and vomiting.  Endocrine: Negative for polyuria.  Genitourinary: Negative for dysuria and flank pain.  Musculoskeletal: Positive for myalgias. Negative for neck pain and neck stiffness.  Skin: Negative for rash.  Neurological: Negative for weakness, numbness and headaches.  Hematological: Does not bruise/bleed easily.  Psychiatric/Behavioral: Negative for confusion.     Physical Exam Updated Vital Signs BP (!) 144/71 (BP Location: Right Arm)   Pulse 70   Temp 97.6 F (36.4 C) (Bladder)   Resp 20   Ht 1.778 m (5\' 10" )   Wt 78 kg (172 lb)   SpO2 96%   BMI 24.68 kg/m   Physical Exam  Constitutional: He is oriented to person, place, and time. He appears well-developed and well-nourished.  Pt reacts to pain, with even light touch anywhere on his body.   HENT:  Head: Atraumatic.  Mouth/Throat: Oropharynx is clear and moist.  Eyes: Pupils are equal, round, and reactive to light. Conjunctivae are  normal.  Neck: Neck supple. No tracheal deviation present. No thyromegaly present.  No stiffness or rigidity.   Cardiovascular: Normal rate, regular rhythm, normal heart sounds and intact distal pulses. Exam reveals no gallop and no friction rub.  No murmur heard. Pulmonary/Chest: Effort normal and breath sounds normal. No accessory muscle usage. No respiratory distress.  Abdominal: Soft. Bowel sounds are normal. He exhibits no distension. There is no tenderness.  Genitourinary:  Genitourinary Comments: No cva tenderness  Musculoskeletal: He exhibits no edema.  Neurological: He is alert and oriented to person, place, and time.  Speech clear/fluent. Motor/sens grossly intact bil.   Skin: Skin is warm and dry. No rash noted.  Psychiatric: He has a normal mood and affect.  Nursing note and vitals reviewed.    ED Treatments / Results  Labs (all labs ordered are listed, but only abnormal results are displayed) Results for orders placed or performed during the hospital encounter of 08/25/17  Urinalysis, Routine w reflex microscopic  Result Value Ref Range   Color, Urine YELLOW YELLOW   APPearance CLOUDY (A) CLEAR   Specific Gravity, Urine 1.015 1.005 - 1.030   pH 7.0 5.0 - 8.0   Glucose, UA NEGATIVE NEGATIVE mg/dL   Hgb urine dipstick MODERATE (A) NEGATIVE   Bilirubin Urine NEGATIVE NEGATIVE   Ketones, ur NEGATIVE NEGATIVE mg/dL   Protein, ur NEGATIVE NEGATIVE mg/dL   Nitrite POSITIVE (A) NEGATIVE   Leukocytes, UA LARGE (A) NEGATIVE   RBC / HPF 11-20 0 - 5 RBC/hpf   WBC, UA >50 (H) 0 - 5 WBC/hpf   Bacteria, UA MANY (A) NONE SEEN   WBC Clumps PRESENT    Mucus PRESENT    Amorphous Crystal PRESENT   CBC  Result Value Ref Range   WBC 7.0 4.0 - 10.5 K/uL   RBC 4.13 (L) 4.22 - 5.81 MIL/uL   Hemoglobin 13.3 13.0 - 17.0 g/dL   HCT 40.3 39.0 - 52.0 %   MCV 97.6 78.0 - 100.0 fL   MCH 32.2 26.0 - 34.0 pg   MCHC 33.0 30.0 - 36.0 g/dL   RDW 14.8 11.5 - 15.5 %   Platelets 206 150 - 400  K/uL  I-stat troponin, ED  Result Value Ref Range   Troponin i, poc 0.00 0.00 - 0.08 ng/mL   Comment 3           Dg Chest 2 View  Result Date: 08/25/2017 CLINICAL DATA:  Weakness. EXAM: CHEST - 2 VIEW COMPARISON:  11/27/2016 FINDINGS: There is a large chronic hiatal hernia. There is slight atelectasis at the lung bases but the patient has taken a very shallow inspiration. No discrete infiltrates or effusions. Heart size is normal. CABG. Pacemaker in place. No acute bone abnormality. IMPRESSION: Mild bibasilar atelectasis, at least in part due to a shallow inspiration. Large chronic hiatal hernia. Electronically Signed   By: [name] M.D.   On: 08/25/2017 13:13   Ct Head Wo Contrast  Result Date: 08/25/2017 CLINICAL DATA:  Altered mental status and possible fall. EXAM: CT HEAD WITHOUT CONTRAST CT CERVICAL SPINE WITHOUT CONTRAST TECHNIQUE: Multidetector CT imaging of the head and cervical spine was performed following the standard protocol without intravenous contrast. Multiplanar CT image reconstructions of the cervical spine were also generated. COMPARISON:  Head CT 01/11/2016 FINDINGS: CT HEAD FINDINGS Brain: There is no mass, hemorrhage or extra-axial collection. There is generalized atrophy without lobar predilection. There is no acute or chronic infarction. There is hypoattenuation of the periventricular white matter, most commonly indicating chronic ischemic microangiopathy. Vascular: Atherosclerotic calcification of the internal  carotid arteries at the skull base. No abnormal hyperdensity of the major intracranial arteries or dural venous sinuses. Skull: The visualized skull base, calvarium and extracranial soft tissues are normal. Sinuses/Orbits: No fluid levels or advanced mucosal thickening of the visualized paranasal sinuses. No mastoid or middle ear effusion. The orbits are normal. CT CERVICAL SPINE FINDINGS Alignment: Grade 1 anterolisthesis at C3-C4 and C4-C5. This is secondary to  facet hypertrophy. Alignment is otherwise normal. Skull base and vertebrae: No acute fracture. Soft tissues and spinal canal: No prevertebral fluid or swelling. No visible canal hematoma. Disc levels: Fusion of the left C3-4 facet joint. Facet arthrosis is most severe at right C4-C6. No spinal canal stenosis. Upper chest: No pneumothorax, pulmonary nodule or pleural effusion. Other: Normal visualized paraspinal cervical soft tissues. IMPRESSION: 1. Chronic ischemic microangiopathy and generalized volume loss without acute intracranial abnormality. 2. No acute fracture of the cervical spine. 3. Multilevel moderate-to-severe facet arthrosis with resultant spondylolisthesis. Electronically Signed   By: Ulyses Jarred M.D.   On: 08/25/2017 13:53   Ct Cervical Spine Wo Contrast  Result Date: 08/25/2017 CLINICAL DATA:  Altered mental status and possible fall. EXAM: CT HEAD WITHOUT CONTRAST CT CERVICAL SPINE WITHOUT CONTRAST TECHNIQUE: Multidetector CT imaging of the head and cervical spine was performed following the standard protocol without intravenous contrast. Multiplanar CT image reconstructions of the cervical spine were also generated. COMPARISON:  Head CT 01/11/2016 FINDINGS: CT HEAD FINDINGS Brain: There is no mass, hemorrhage or extra-axial collection. There is generalized atrophy without lobar predilection. There is no acute or chronic infarction. There is hypoattenuation of the periventricular white matter, most commonly indicating chronic ischemic microangiopathy. Vascular: Atherosclerotic calcification of the internal carotid arteries at the skull base. No abnormal hyperdensity of the major intracranial arteries or dural venous sinuses. Skull: The visualized skull base, calvarium and extracranial soft tissues are normal. Sinuses/Orbits: No fluid levels or advanced mucosal thickening of the visualized paranasal sinuses. No mastoid or middle ear effusion. The orbits are normal. CT CERVICAL SPINE FINDINGS  Alignment: Grade 1 anterolisthesis at C3-C4 and C4-C5. This is secondary to facet hypertrophy. Alignment is otherwise normal. Skull base and vertebrae: No acute fracture. Soft tissues and spinal canal: No prevertebral fluid or swelling. No visible canal hematoma. Disc levels: Fusion of the left C3-4 facet joint. Facet arthrosis is most severe at right C4-C6. No spinal canal stenosis. Upper chest: No pneumothorax, pulmonary nodule or pleural effusion. Other: Normal visualized paraspinal cervical soft tissues. IMPRESSION: 1. Chronic ischemic microangiopathy and generalized volume loss without acute intracranial abnormality. 2. No acute fracture of the cervical spine. 3. Multilevel moderate-to-severe facet arthrosis with resultant spondylolisthesis. Electronically Signed   By: Ulyses Jarred M.D.   On: 08/25/2017 13:53    EKG None  Radiology No results found.  Procedures Procedures (including critical care time)  Medications Ordered in ED Medications  0.9 %  sodium chloride infusion (has no administration in time range)     Initial Impression / Assessment and Plan / ED Course  I have reviewed the triage vital signs and the nursing notes.  Pertinent labs & imaging results that were available during my care of the patient were reviewed by me and considered in my medical decision making (see chart for details).  Iv ns. Labs. Imaging.   Reviewed nursing notes and prior charts for additional history.   Labs reviewed - chem normal. ua is positive for infection. u cx sent. Rocephin iv.   Given v poor po intake, degree of pain,  malaise and weakness, will admit.  hospitalists consulted for admission.    Final Clinical Impressions(s) / ED Diagnoses   Final diagnoses:  None    ED Discharge Orders    None       Lajean Saver, MD 08/25/17 1428

## 2017-08-25 NOTE — ED Notes (Signed)
ED TO INPATIENT HANDOFF REPORT  Name/Age/Gender Adam [name] 84 y.o. male  Code Status Code Status History    Date Active Date Inactive Code Status Order ID Comments User Context   01/22/2017 1526 01/22/2017 2022 Full Code [id number]  [name], MD Inpatient   03/10/2016 1718 03/11/2016 1818 Full Code [id number]  Adam Griffins, MD ED   01/10/2016 1743 01/12/2016 2020 Full Code [id number]  Adam Dickens, MD Inpatient   01/10/2016 1743 01/10/2016 1743 Full Code [id number]  Adam Dickens, MD Inpatient   12/07/2015 0005 12/08/2015 2202 Full Code [id number]  Adam Baker, MD Inpatient   12/30/2014 0340 01/01/2015 1617 Full Code [id number]  Adam Morton, MD ED   12/03/2014 1916 12/05/2014 1754 Full Code [id number]  Adam Gunning, NP Inpatient   01/29/2014 2358 02/01/2014 1642 Full Code [id number]  Adam Border, MD Inpatient   12/15/2012 1641 12/16/2012 2226 Full Code [id number]  Adam, [place], PA-C Inpatient   06/05/2011 2143 06/08/2011 1710 Full Code [id number]  Adam Feil, RN Inpatient    Advance Directive Documentation     Most Recent Value  Type of Advance Directive  Healthcare Power of Attorney, Living will  Pre-existing out of facility DNR order (yellow form or pink MOST form)  -  "MOST" Form in Place?  -      Home/SNF/Other Friends Insurance account manager Complaint pains all over  Level of Care/Admitting Diagnosis ED Disposition    ED Disposition Condition [place]: [place] [[id number]]  Level of Care: Telemetry [5]  Admit to tele based on following criteria: Monitor QTC interval  Diagnosis: Generalized weakness [361443]  Admitting Physician: Foss, [place] [[id number]]  Attending Physician: Raiford Noble LATIF [[id number]]  Estimated length of stay: past midnight tomorrow  Certification:: I certify this patient will need inpatient services for at least 2 midnights  PT Class (Do Not Modify): Inpatient [101]  PT Acc Code (Do  Not Modify): Private [1]       Medical History Past Medical History:  Diagnosis Date  . Arthritis    "minor, back and sometimes knees" (12/15/2012)  . Bradycardia    AFib/SSS s/p St Jude PPM 04/12/2008  . CAD (coronary artery disease) 12/30/2014   CABG (LIMA-LAD, SVG-RCA, SVG-OM in 1996).  07/2009 BMS to SVG-RCA. Cath in 04/2010 with patent stents   . Cardiomyopathy, ischemic 08/25/2012  . CHF (congestive heart failure) ([place])   . Chronic knee pain 12/03/2014  . Combined congestive systolic and diastolic heart failure ([place]) 02/02/2015   Hx EF 41%. BNP 96.8 02/21/15 Torsemide 04/06/15 Na 142, K 4.6, Bun 16, creat 0.89 04/20/14 BNP 111.7, Na 142, K 4.6, Bun 16, creat 0.9   . Depression with anxiety 02/02/2015   02/21/15 Hgb A1c 5.8 03/10/15 MMSE 30/30   . Dizziness, after diuretic asscoiated with hypotension and responded to fluid bolus 06/05/2011   04/28/15 US carotid R+L normal bilateral arterial velocities.    Marland Kitchen Dyspnea 08/11/2014   Followed in Pulmonary clinic/ [facility]/ [place]  - 08/11/2014  Walked RA x 1 laps @ 185 ft each stopped due to fatigue/off balance/ slow pace/  no sob or desat  - PFT's  09/26/2014  FEV1 2.26 (85 % ) ratio 76  p no % improvement from saba with DLCO  67 % corrects to 93 % for alv volume      Since prev study 08/04/13 minimal change lung vol or dlco    .  Embolic cerebral infarction ([place]) 12/06/2015  . Exertional shortness of breath    "sometimes walking" (12/15/2012)  . GERD (gastroesophageal reflux disease)   . Gout 02/09/2015  . Heart murmur    "just told I had one today" (12/15/2012)  . Hiatal hernia   . Hyperlipidemia   . Hypertension   . Hypothyroid   . Influenza A 03/10/2016  . Insomnia   . Melanoma of back ([place]) 1976  . Myocardial infarction [facility]) 1996; 2011   "both silent" (12/15/2012)  . Nonrheumatic aortic valve stenosis   . Orthostatic hypotension   . Osteoporosis, senile   . Pacemaker   . RBBB   . Restless leg 02/02/2015  . Right leg weakness  12/06/2015  . [facility] spotted fever   . S/P CABG x 4   . Sick sinus syndrome ([place]) 01/31/2014  . Sustained ventricular tachycardia ([place]) 07/27/2014  . Urinary retention 12/30/2014    Allergies Allergies  Allergen Reactions  . Altace [Ramipril] Cough  . Crestor [Rosuvastatin Calcium] Rash  . Penicillins Rash and Other (See Comments)    Has patient had a PCN reaction causing immediate rash, facial/tongue/throat swelling, SOB or lightheadedness with hypotension: Yes Has patient had a PCN reaction causing severe rash involving mucus membranes or skin necrosis: No Has patient had a PCN reaction that required hospitalization No Has patient had a PCN reaction occurring within the last 10 years: No If all of the above answers are "NO", then may proceed with Cephalosporin use.   . Sulfa Antibiotics Rash    IV Location/Drains/Wounds Patient Lines/Drains/Airways Status   Active Line/Drains/Airways    Name:   Placement date:   Placement time:   Site:   Days:   Peripheral IV 01/22/17 Left Antecubital   01/22/17    1334    Antecubital   215   Peripheral IV 03/18/17 Left Arm   03/18/17    2108    Arm   160   Peripheral IV 08/25/17 Left Arm   08/25/17    1344    Arm   less than 1   Urethral Catheter Ify-RN Coude 18 Fr.   03/11/16    1110    Coude   532   Pressure Ulcer 12/31/14 Stage II -  Partial thickness loss of dermis presenting as a shallow open ulcer with a red, pink wound bed without slough.   12/31/14    1512     968   Pressure Injury 03/10/16    03/10/16    1925     533          Labs/Imaging Results for orders placed or performed during the hospital encounter of 08/25/17 (from the past 48 hour(s))  Urinalysis, Routine w reflex microscopic     Status: Abnormal   Collection Time: 08/25/17  1:46 PM  Result Value Ref Range   Color, Urine YELLOW YELLOW   APPearance CLOUDY (A) CLEAR   Specific Gravity, Urine 1.015 1.005 - 1.030   pH 7.0 5.0 - 8.0   Glucose, UA NEGATIVE NEGATIVE  mg/dL   Hgb urine dipstick MODERATE (A) NEGATIVE   Bilirubin Urine NEGATIVE NEGATIVE   Ketones, ur NEGATIVE NEGATIVE mg/dL   Protein, ur NEGATIVE NEGATIVE mg/dL   Nitrite POSITIVE (A) NEGATIVE   Leukocytes, UA LARGE (A) NEGATIVE   RBC / HPF 11-20 0 - 5 RBC/hpf   WBC, UA >50 (H) 0 - 5 WBC/hpf   Bacteria, UA MANY (A) NONE SEEN   WBC Clumps PRESENT  Mucus PRESENT    Amorphous Crystal PRESENT     Comment: Performed at [facility], [place] [address] [place], [place] [address]  CBC     Status: Abnormal   Collection Time: 08/25/17  1:46 PM  Result Value Ref Range   WBC 7.0 4.0 - 10.5 K/uL   RBC 4.13 (L) 4.22 - 5.81 MIL/uL   Hemoglobin 13.3 13.0 - 17.0 g/dL   HCT 40.3 39.0 - 52.0 %   MCV 97.6 78.0 - 100.0 fL   MCH 32.2 26.0 - 34.0 pg   MCHC 33.0 30.0 - 36.0 g/dL   RDW 14.8 11.5 - 15.5 %   Platelets 206 150 - 400 K/uL    Comment: Performed at [facility], [place] [address], [place], [place] [address]  Comprehensive metabolic panel     Status: Abnormal   Collection Time: 08/25/17  1:46 PM  Result Value Ref Range   Sodium 140 135 - 145 mmol/L   Potassium 3.9 3.5 - 5.1 mmol/L   Chloride 106 98 - 111 mmol/L   CO2 25 22 - 32 mmol/L   Glucose, Bld 108 (H) 70 - 99 mg/dL   BUN 13 8 - 23 mg/dL   Creatinine, Ser 0.56 (L) 0.61 - 1.24 mg/dL   Calcium 8.6 (L) 8.9 - 10.3 mg/dL   Total Protein 6.8 6.5 - 8.1 g/dL   Albumin 3.1 (L) 3.5 - 5.0 g/dL   AST 23 15 - 41 U/L   ALT 19 0 - 44 U/L   Alkaline Phosphatase 65 38 - 126 U/L   Total Bilirubin 0.5 0.3 - 1.2 mg/dL   GFR calc non Af Amer >60 >60 mL/min   GFR calc Af Amer >60 >60 mL/min    Comment: (NOTE) The eGFR has been calculated using the CKD EPI equation. This calculation has not been validated in all clinical situations. eGFR's persistently <60 mL/min signify possible Chronic Kidney Disease.    Anion gap 9 5 - 15    Comment: Performed at [facility], [place] [address],  [place], [place] [address]  I-stat troponin, ED     Status: None   Collection Time: 08/25/17  1:53 PM  Result Value Ref Range   Troponin i, poc 0.00 0.00 - 0.08 ng/mL   Comment 3            Comment: Due to the release kinetics of cTnI, a negative result within the first hours of the onset of symptoms does not rule out myocardial infarction with certainty. If myocardial infarction is still suspected, repeat the test at appropriate intervals.    Dg Chest 2 View  Result Date: 08/25/2017 CLINICAL DATA:  Weakness. EXAM: CHEST - 2 VIEW COMPARISON:  11/27/2016 FINDINGS: There is a large chronic hiatal hernia. There is slight atelectasis at the lung bases but the patient has taken a very shallow inspiration. No discrete infiltrates or effusions. Heart size is normal. CABG. Pacemaker in place. No acute bone abnormality. IMPRESSION: Mild bibasilar atelectasis, at least in part due to a shallow inspiration. Large chronic hiatal hernia. Electronically Signed   By: [name] M.D.   On: 08/25/2017 13:13   Ct Head Wo Contrast  Result Date: 08/25/2017 CLINICAL DATA:  Altered mental status and possible fall. EXAM: CT HEAD WITHOUT CONTRAST CT CERVICAL SPINE WITHOUT CONTRAST TECHNIQUE: Multidetector CT imaging of the head and cervical spine was performed following the standard protocol without intravenous contrast. Multiplanar CT image reconstructions of the cervical  spine were also generated. COMPARISON:  Head CT 01/11/2016 FINDINGS: CT HEAD FINDINGS Brain: There is no mass, hemorrhage or extra-axial collection. There is generalized atrophy without lobar predilection. There is no acute or chronic infarction. There is hypoattenuation of the periventricular white matter, most commonly indicating chronic ischemic microangiopathy. Vascular: Atherosclerotic calcification of the internal carotid arteries at the skull base. No abnormal hyperdensity of the major intracranial arteries or dural venous sinuses. Skull: The  visualized skull base, calvarium and extracranial soft tissues are normal. Sinuses/Orbits: No fluid levels or advanced mucosal thickening of the visualized paranasal sinuses. No mastoid or middle ear effusion. The orbits are normal. CT CERVICAL SPINE FINDINGS Alignment: Grade 1 anterolisthesis at C3-C4 and C4-C5. This is secondary to facet hypertrophy. Alignment is otherwise normal. Skull base and vertebrae: No acute fracture. Soft tissues and spinal canal: No prevertebral fluid or swelling. No visible canal hematoma. Disc levels: Fusion of the left C3-4 facet joint. Facet arthrosis is most severe at right C4-C6. No spinal canal stenosis. Upper chest: No pneumothorax, pulmonary nodule or pleural effusion. Other: Normal visualized paraspinal cervical soft tissues. IMPRESSION: 1. Chronic ischemic microangiopathy and generalized volume loss without acute intracranial abnormality. 2. No acute fracture of the cervical spine. 3. Multilevel moderate-to-severe facet arthrosis with resultant spondylolisthesis. Electronically Signed   By: Ulyses Jarred M.D.   On: 08/25/2017 13:53   Ct Cervical Spine Wo Contrast  Result Date: 08/25/2017 CLINICAL DATA:  Altered mental status and possible fall. EXAM: CT HEAD WITHOUT CONTRAST CT CERVICAL SPINE WITHOUT CONTRAST TECHNIQUE: Multidetector CT imaging of the head and cervical spine was performed following the standard protocol without intravenous contrast. Multiplanar CT image reconstructions of the cervical spine were also generated. COMPARISON:  Head CT 01/11/2016 FINDINGS: CT HEAD FINDINGS Brain: There is no mass, hemorrhage or extra-axial collection. There is generalized atrophy without lobar predilection. There is no acute or chronic infarction. There is hypoattenuation of the periventricular white matter, most commonly indicating chronic ischemic microangiopathy. Vascular: Atherosclerotic calcification of the internal carotid arteries at the skull base. No abnormal  hyperdensity of the major intracranial arteries or dural venous sinuses. Skull: The visualized skull base, calvarium and extracranial soft tissues are normal. Sinuses/Orbits: No fluid levels or advanced mucosal thickening of the visualized paranasal sinuses. No mastoid or middle ear effusion. The orbits are normal. CT CERVICAL SPINE FINDINGS Alignment: Grade 1 anterolisthesis at C3-C4 and C4-C5. This is secondary to facet hypertrophy. Alignment is otherwise normal. Skull base and vertebrae: No acute fracture. Soft tissues and spinal canal: No prevertebral fluid or swelling. No visible canal hematoma. Disc levels: Fusion of the left C3-4 facet joint. Facet arthrosis is most severe at right C4-C6. No spinal canal stenosis. Upper chest: No pneumothorax, pulmonary nodule or pleural effusion. Other: Normal visualized paraspinal cervical soft tissues. IMPRESSION: 1. Chronic ischemic microangiopathy and generalized volume loss without acute intracranial abnormality. 2. No acute fracture of the cervical spine. 3. Multilevel moderate-to-severe facet arthrosis with resultant spondylolisthesis. Electronically Signed   By: Ulyses Jarred M.D.   On: 08/25/2017 13:53    Pending Labs Unresulted Labs (From admission, onward)   Start     Ordered   08/25/17 1447  Blood culture (routine x 2)  BLOOD CULTURE X 2,   STAT     08/25/17 1446   08/25/17 1429  Urine Culture  Once,   STAT     08/25/17 1428   Signed and Held  Sedimentation rate  Once,   R     Signed  and Held   Signed and Held  C-reactive protein  Once,   R     Signed and Held   Signed and Held  Urine culture  Once,   R     Signed and Held   Signed and Held  Brain natriuretic peptide  Add-on,   R     Signed and Held   Signed and Held  Comprehensive metabolic panel  Tomorrow morning,   R     Signed and Held   Signed and Held  Magnesium  Tomorrow morning,   R     Signed and Held   Signed and Held  Phosphorus  Tomorrow morning,   R     Signed and Held       Vitals/Pain Today's Vitals   08/25/17 1240 08/25/17 1500 08/25/17 1506 08/25/17 1542  BP:  (!) 154/70    Pulse:  72    Resp:  18    Temp:      TempSrc:      SpO2:  94%    Weight: 172 lb (78 kg)     Height: '5\' 10"'  (1.778 m)     PainSc:   8  9     Isolation Precautions No active isolations  Medications Medications  0.9 %  sodium chloride infusion ( Intravenous Hold 08/25/17 1345)  sodium chloride 0.9 % bolus 1,000 mL (0 mLs Intravenous Stopped 08/25/17 1446)  HYDROmorphone (DILAUDID) injection 0.5 mg (0.5 mg Intravenous Given 08/25/17 1422)  cefTRIAXone (ROCEPHIN) 1 g in sodium chloride 0.9 % 100 mL IVPB (0 g Intravenous Stopped 08/25/17 1540)    Mobility walks with device- increased weakness

## 2017-08-25 NOTE — ED Notes (Signed)
Family contact- daughter Caryl Asp [phone]

## 2017-08-26 LAB — COMPREHENSIVE METABOLIC PANEL
ALT: 15 U/L (ref 0–44)
AST: 24 U/L (ref 15–41)
Albumin: 2.7 g/dL — ABNORMAL LOW (ref 3.5–5.0)
Alkaline Phosphatase: 62 U/L (ref 38–126)
Anion gap: 7 (ref 5–15)
BUN: 13 mg/dL (ref 8–23)
CO2: 25 mmol/L (ref 22–32)
Calcium: 8.6 mg/dL — ABNORMAL LOW (ref 8.9–10.3)
Chloride: 107 mmol/L (ref 98–111)
Creatinine, Ser: 0.49 mg/dL — ABNORMAL LOW (ref 0.61–1.24)
GFR calc Af Amer: >60 mL/min (ref >60)
GFR calc non Af Amer: >60 mL/min (ref >60)
Glucose, Bld: 104 mg/dL — ABNORMAL HIGH (ref 70–99)
Potassium: 4.2 mmol/L (ref 3.5–5.1)
Sodium: 139 mmol/L (ref 135–145)
Total Bilirubin: 1.1 mg/dL (ref 0.3–1.2)
Total Protein: 6.3 g/dL — ABNORMAL LOW (ref 6.5–8.1)

## 2017-08-26 LAB — TROPONIN I
Troponin I: 0.03 ng/mL (ref <0.03)
Troponin I: 0.03 ng/mL (ref <0.03)

## 2017-08-26 LAB — MAGNESIUM: Magnesium: 2.2 mg/dL (ref 1.7–2.4)

## 2017-08-26 LAB — CK: Total CK: 102 U/L (ref 49–397)

## 2017-08-26 LAB — TSH: TSH: 1.125 u[IU]/mL (ref 0.350–4.500)

## 2017-08-26 LAB — LACTATE DEHYDROGENASE: LDH: 110 U/L (ref 98–192)

## 2017-08-26 LAB — PHOSPHORUS: Phosphorus: 3.4 mg/dL (ref 2.5–4.6)

## 2017-08-26 MED ORDER — BENZONATATE 100 MG PO CAPS
100.0000 mg | ORAL_CAPSULE | Freq: Two times a day (BID) | ORAL | Status: DC
  Administered 2017-08-26 – 2017-08-27 (×2): 100 mg via ORAL
  Filled 2017-08-26 (×2): qty 1

## 2017-08-26 MED ORDER — CHLORHEXIDINE GLUCONATE CLOTH 2 % EX PADS
6.0000 | MEDICATED_PAD | Freq: Every day | CUTANEOUS | Status: AC
  Administered 2017-08-26 – 2017-08-30 (×4): 6 via TOPICAL

## 2017-08-26 MED ORDER — MAGIC MOUTHWASH W/LIDOCAINE
15.0000 mL | Freq: Four times a day (QID) | ORAL | Status: DC | PRN
  Administered 2017-08-26 (×2): 15 mL via ORAL
  Filled 2017-08-26 (×3): qty 15

## 2017-08-26 MED ORDER — HYDROCODONE-ACETAMINOPHEN 5-325 MG PO TABS
2.0000 | ORAL_TABLET | Freq: Once | ORAL | Status: AC
  Administered 2017-08-26: 2 via ORAL
  Filled 2017-08-26 (×2): qty 2

## 2017-08-26 MED ORDER — MUPIROCIN 2 % EX OINT
1.0000 "application " | TOPICAL_OINTMENT | Freq: Two times a day (BID) | CUTANEOUS | Status: AC
  Administered 2017-08-26 – 2017-08-30 (×10): 1 via NASAL
  Filled 2017-08-26: qty 22

## 2017-08-26 MED ORDER — PHENOL 1.4 % MT LIQD
1.0000 | OROMUCOSAL | Status: DC | PRN
  Administered 2017-09-04: 1 via OROMUCOSAL
  Filled 2017-08-26: qty 177

## 2017-08-26 MED ORDER — SUCRALFATE 1 GM/10ML PO SUSP
1.0000 g | Freq: Three times a day (TID) | ORAL | Status: DC
  Administered 2017-08-26 – 2017-08-27 (×3): 1 g via ORAL
  Filled 2017-08-26 (×4): qty 10

## 2017-08-26 MED ORDER — GABAPENTIN 100 MG PO CAPS
100.0000 mg | ORAL_CAPSULE | Freq: Three times a day (TID) | ORAL | Status: DC
  Administered 2017-08-26: 100 mg via ORAL
  Filled 2017-08-26: qty 1

## 2017-08-26 MED ORDER — GUAIFENESIN 100 MG/5ML PO SOLN
5.0000 mL | ORAL | Status: DC | PRN
  Administered 2017-08-26 (×2): 100 mg via ORAL
  Filled 2017-08-26 (×2): qty 10

## 2017-08-26 MED ORDER — HYDROCODONE-ACETAMINOPHEN 5-325 MG PO TABS
1.0000 | ORAL_TABLET | ORAL | Status: DC | PRN
  Administered 2017-08-26 – 2017-09-04 (×8): 1 via ORAL
  Filled 2017-08-26 (×8): qty 1

## 2017-08-26 NOTE — Clinical Social Work Note (Signed)
Clinical Social Work Assessment  Patient Details  Name: Adam [name] MRN: [id number] Date of Birth: 01-28-1927  Date of referral:  08/26/17               Reason for consult:  (resident of facility)                Permission sought to share information with:  Family Supports, Chartered certified accountant granted to share information::  Yes, Verbal Permission Granted  Name::     [name]  Agency::  [facility]  Relationship::     Contact Information:     Housing/Transportation Living arrangements for the past 2 months:  [place] of Information:  Patient, Spouse, Facility Patient Interpreter Needed:  None Criminal Activity/Legal Involvement Pertinent to Current Situation/Hospitalization:  No - Comment as needed Significant Relationships:  Adult Children, Spouse, Community Support Lives with:  Facility Resident Do you feel safe going back to the place where you live?  Yes Need for family participation in patient care:  No (Coment)  Care giving concerns:  Pt admitted from [facility] building (SNF). Was receiving part time physical therapy there.  Currently admitted for UTI.   Social Worker assessment / plan:  CSW consulted to assist with disposition- pt is resident of facility- [facility]. Met with pt and wife at bedside- both understanding of role and report plan to have pt return at DC. Updated facility. Will follow and coordinate planned return to [facility] at DC.  Employment status:  Retired Engineer, maintenance (IT)) PT Recommendations:  Not assessed at this time Information / Referral to community resources:     Patient/Family's Response to care:  Both pt and wife appreciative  Patient/Family's Understanding of and Emotional Response to Diagnosis, Current Treatment, and Prognosis:  Both pt and wife demonstrated good understanding of reasons for admission. Discussion was  brief as pt was attempting to eat lunch and also reported being in some pain "when he tries to move around the bed." Emotionally both were pleasant and appropriate.  Emotional Assessment Appearance:  Appears stated age Attitude/Demeanor/Rapport:  (pleasant but reports being in pain, grimacing) Affect (typically observed):  (appropriate,good eye contact) Orientation:  Oriented to Self, Oriented to Place, Oriented to  Time, Oriented to Situation Alcohol / Substance use:  Not Applicable Psych involvement (Current and /or in the community):  No (Comment)  Discharge Needs  Concerns to be addressed:  Care Coordination Readmission within the last 30 days:  No Current discharge risk:  None Barriers to Discharge:  Continued Medical Work up   [employer], LCSW 08/26/2017, 12:16 PM  [phone]

## 2017-08-26 NOTE — Progress Notes (Signed)
CRITICAL VALUE ALERT  Critical Value:  Troponin 0.03  Date & Time Notied:  08/26/17 1732 via page  Provider Notified: Dr Posey Pronto

## 2017-08-26 NOTE — Progress Notes (Signed)
I agree with the assessment performed by the previous nurse. 

## 2017-08-26 NOTE — Progress Notes (Signed)
[facility] Progress Note  Patient: Adam [name] PNT:[id number]   PCP: Blanchie Serve, MD DOB: [birth year]   DOA: 08/25/2017   DOS: 08/26/2017   Date of Service: the patient was seen and examined on 08/26/2017  Subjective: Patient complains of dysphagia and odynophagia.  Also nausea.  Also headache, dizziness, neck pain, bilateral shoulder pain, chest pain and chest heaviness as well as shortness of breath, abdominal pain, along with bilateral leg pain which currently has resolved. If ask which is the most bothering pain and he mentions it is his neck. 1 month ago patient was at his baseline during which she would be walking with a Rollator, did not have any pain and no other acute complaint. Only new medication is droxidopa which the patient has been taking it for a few months. Reportedly this all started with some tremors followed by having pain in his neck followed by having generalized body ache to the extent that the patient also has odynophagia.  No ulcers reported.  Brief hospital course: Pt. with PMH of CAD S/P CABG, chronic combined CHF, depression, anxiety, PPM implant, CVA, GERD, chronic Foley catheter, RLS, chronic hypotension; admitted on 08/25/2017, presented with complaint of generalized body ache, was found to have elevated sed rate and a possible UTI. Currently further plan is continue IV antibiotics and further work-up for generalized body ache.  Assessment and Plan: 1.  UTI. Presents with generalized weakness as well as body ache. Blood cultures done currently pending. Urine cultures currently pending. Started on IV ceftriaxone which we will continue. Chronic Foley catheter present on admission. Recently changed. Completed gentle IV hydration.  2.  Generalized myalgia and body ache. Odynophagia. Generalized weakness with near fall. Chronic hypotension. Reportedly this all started 4 days ago, before that the patient was at his baseline during which he does not have  any of these above complaints. No fever no chills reported.  No cough reported. Patient is refusing to move in the bed due to severe pain. Work-up shows mild elevation of ESR of 46, CRP of 13.6.  Although since these are also acute inflammatory markers if the patient has an acute UTI this markers will be elevated therefore data usability is limited in current presentation. With blurred vision and generalized myalgia there was some concern for polymyalgia rheumatica on admission and patient was started on empiric prednisone although currently I will hold off on it until further work-up and would also like to monitor improvement with IV antibiotics and UTI, since this can commit patient to lifelong steroids. Checking troponin, LDH, aldolase, CK, TSH. X-ray bilateral shoulder shows mild arthritis no acute abnormality. Patient refusing to move his shoulder on exam. Ideally I would also like to check MRI of his C-spine although the patient has a pacemaker and therefore cannot get MRI. CT head and CT C-spine on admission were unremarkable for any acute abnormality.  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 daily before breakfast  GERD -Continue with Pantoprazole 40 mg p.o. twice daily  Restless leg syndrome -Continue ropinirole 0.5 mill grams p.o. 3 times daily as needed for restless legs  History of CVA with Expressive Aphasia -Continue with Aspirin 81 mg and Clopidogrel 75 mg po  Daily   Neurogenic mediated orthostatic Hypotension -Continue with droxidopa 100 mg p.o. 3 times daily and Midrin 10 mg p.o. 3 times daily  Hyperlipidemia -Continue with Pravastatin 40 mg  p.o. Nightly  Sacral Decubitus -WOC Nurse  Diet: cardiac diet DVT Prophylaxis: subcutaneous Heparin  Advance goals of care discussion: full code  Family Communication: family was present at bedside, at the time of interview. The pt provided permission to discuss medical plan with the family. Opportunity was given to ask question and all questions were answered satisfactorily.   Disposition:  Discharge to SNF.  Consultants: none Procedures: none  Antibiotics: Anti-infectives (From admission, onward)   Start     Dose/Rate Route Frequency Ordered Stop   08/26/17 1400  cefTRIAXone (ROCEPHIN) 1 g in sodium chloride 0.9 % 100 mL IVPB     1 g 200 mL/hr over 30 Minutes Intravenous Every 24 hours 08/25/17 1757     08/25/17 1430  cefTRIAXone (ROCEPHIN) 1 g in sodium chloride 0.9 % 100 mL IVPB     1 g 200 mL/hr over 30 Minutes Intravenous  Once 08/25/17 1428 08/25/17 1540       Objective: Physical Exam: Vitals:   08/25/17 1822 08/25/17 2204 08/26/17 0430 08/26/17 1057  BP:  (!) 150/73 (!) 125/55 (!) 117/53  Pulse:  70 70 70  Resp:  '20 20 19  ' Temp:  98.7 F (37.1 C) 98.3 F (36.8 C) 97.9 F (36.6 C)  TempSrc:  Oral Oral Oral  SpO2:  95% 95% 92%  Weight: 77.8 kg (171 lb 8.3 oz)  79.5 kg (175 lb 4.3 oz)   Height: '5\' 10"'  (1.778 m)       Intake/Output Summary (Last 24 hours) at 08/26/2017 1922 Last data filed at 08/26/2017 1840 Gross per 24 hour  Intake 1140.84 ml  Output 1400 ml  Net -259.16 ml   Filed Weights   08/25/17 1240 08/25/17 1822 08/26/17 0430  Weight: 78 kg (172 lb) 77.8 kg (171 lb 8.3 oz) 79.5 kg (175 lb 4.3 oz)   General: Alert, Awake and Oriented to Time, Place and Person. Appear in moderate distress, affect flat Eyes: PERRL, Conjunctiva normal ENT: Oral Mucosa clear moist. Neck: no JVD, no Abnormal Mass Or lumps Cardiovascular: S1 and S2 Present, aortic systolic Murmur, Peripheral Pulses Present Respiratory: normal respiratory effort, Bilateral Air entry  equal and Decreased, no use of accessory muscl fain basal Crackles, no wheezes Abdomen: Bowel Sound present, Soft and no tenderness, no hernia Skin: no redness, no Rash, no induration Extremities: no Pedal edema, no calf tenderness Neurologic: Grossly no focal neuro deficit. Bilaterally Equal motor strength  Data Reviewed: CBC: Recent Labs  Lab 08/25/17 1346  WBC 7.0  HGB 13.3  HCT 40.3  MCV 97.6  PLT 407   Basic Metabolic Panel: Recent Labs  Lab 08/25/17 1346 08/26/17 0426  NA 140 139  K 3.9 4.2  CL 106 107  CO2 25 25  GLUCOSE 108* 104*  BUN 13 13  CREATININE 0.56* 0.49*  CALCIUM 8.6* 8.6*  MG  --  2.2  PHOS  --  3.4    Liver Function Tests: Recent Labs  Lab 08/25/17 1346 08/26/17 0426  AST 23 24  ALT 19 15  ALKPHOS 65 62  BILITOT 0.5 1.1  PROT 6.8 6.3*  ALBUMIN 3.1* 2.7*   No results for input(s): LIPASE, AMYLASE in the last 168 hours. No results for input(s): AMMONIA in the last 168 hours. Coagulation Profile: No results for input(s): INR, PROTIME  in the last 168 hours. Cardiac Enzymes: Recent Labs  Lab 08/26/17 1439  CKTOTAL 102  TROPONINI 0.03*   BNP (last 3 results) Recent Labs    09/17/16 1517  PROBNP 721.0*   CBG: No results for input(s): GLUCAP in the last 168 hours. Studies: No results found.  Scheduled Meds: . amiodarone  100 mg Oral Daily  . aspirin EC  81 mg Oral Daily  . benzonatate  100 mg Oral BID  . Chlorhexidine Gluconate Cloth  6 each Topical Q0600  . clopidogrel  75 mg Oral Daily  . Droxidopa  100 mg Oral TID  . heparin  5,000 Units Subcutaneous Q8H  . levothyroxine  100 mcg Oral QAC breakfast  . Melatonin  3 mg Oral QHS  . midodrine  10 mg Oral 3 times per day  . mupirocin ointment  1 application Nasal BID  . pantoprazole  40 mg Oral BID  . polyethylene glycol  17 g Oral Daily  . pravastatin  40 mg Oral QHS  . sucralfate  1 g Oral TID WC & HS  . traZODone  50 mg Oral QHS   Continuous Infusions: . cefTRIAXone  (ROCEPHIN)  IV Stopped (08/26/17 1354)   PRN Meds: acetaminophen, guaiFENesin, HYDROcodone-acetaminophen, magic mouthwash w/lidocaine, ondansetron **OR** ondansetron (ZOFRAN) IV, phenol, rOPINIRole  Time spent: 35 minutes  Author: Berle Mull, MD [facility] Pager: [phone] 08/26/2017 7:22 PM  If 7PM-7AM, please contact night-coverage at [email or web address], password [facility]

## 2017-08-26 NOTE — Consult Note (Signed)
[place] Nurse wound consult note Reason for Consult:Moisture associated skin damage to gluteal fold and right buttock crease.  Injury from exposure to incontinence and use of disposable briefs.  Will keep skin clean and moisturized and avoid use of disposable products while in bed. Exposure to dermatherapy, therapeutic linen should wick moisture and improve skin microclimate.  Will turn and reposition every two hours to offload pressure to this at risk area.  Wound type: MASD Pressure Injury POA: NA Measurement: Right buttock 1 cm x 1 cm x 0.1 cm lesion from contact with securely fitted brief.  Gluteal fold 3 cmx 0.5 cm x 0.1 cm denuded skin from contact to incontinence.  Wound YEB:XIDH andmoist Drainage (amount, consistency, odor) scant weeping.  Periwound:intact  Exposure to moisture from incontinence and perspiration likely Dressing procedure/placement/frequency:Cleanse buttocks with soap and water and pat dry.  Apply barrier cream twice daily and PRn soilage.  Will not follow at this time.  Please re-consult if needed.  Domenic Moras RN BSN [place] Pager [phone]

## 2017-08-26 NOTE — Progress Notes (Signed)
0200 assessment, pt C/O throat and neck pain. Upon inspection, throat was red and irritated. Paged on Call [employer]. New order for 2 Norco tabs once and 15 mg magic mouthwash w/ lidocaine 4 X daily PRN.

## 2017-08-27 DIAGNOSIS — E785 Hyperlipidemia, unspecified: Secondary | ICD-10-CM

## 2017-08-27 DIAGNOSIS — K219 Gastro-esophageal reflux disease without esophagitis: Secondary | ICD-10-CM

## 2017-08-27 LAB — LACTIC ACID, PLASMA: Lactic Acid, Venous: 0.9 mmol/L (ref 0.5–1.9)

## 2017-08-27 LAB — CBC WITH DIFFERENTIAL/PLATELET
Basophils Absolute: 0 10*3/uL (ref 0.0–0.1)
Basophils Relative: 0 %
Eosinophils Absolute: 0 10*3/uL (ref 0.0–0.7)
Eosinophils Relative: 0 %
HCT: 36.9 % — ABNORMAL LOW (ref 39.0–52.0)
Hemoglobin: 12.1 g/dL — ABNORMAL LOW (ref 13.0–17.0)
Lymphocytes Relative: 14 %
Lymphs Abs: 1.2 10*3/uL (ref 0.7–4.0)
MCH: 31.8 pg (ref 26.0–34.0)
MCHC: 32.8 g/dL (ref 30.0–36.0)
MCV: 97.1 fL (ref 78.0–100.0)
Monocytes Absolute: 1.2 10*3/uL — ABNORMAL HIGH (ref 0.1–1.0)
Monocytes Relative: 13 %
Neutro Abs: 6.5 10*3/uL (ref 1.7–7.7)
Neutrophils Relative %: 73 %
Platelets: 243 10*3/uL (ref 150–400)
RBC: 3.8 MIL/uL — ABNORMAL LOW (ref 4.22–5.81)
RDW: 14.6 % (ref 11.5–15.5)
WBC: 8.9 10*3/uL (ref 4.0–10.5)

## 2017-08-27 LAB — ALDOLASE: Aldolase: 5.2 U/L (ref 3.3–10.3)

## 2017-08-27 LAB — COMPREHENSIVE METABOLIC PANEL
ALT: 17 U/L (ref 0–44)
AST: 22 U/L (ref 15–41)
Albumin: 2.5 g/dL — ABNORMAL LOW (ref 3.5–5.0)
Alkaline Phosphatase: 59 U/L (ref 38–126)
Anion gap: 8 (ref 5–15)
BUN: 18 mg/dL (ref 8–23)
CO2: 26 mmol/L (ref 22–32)
Calcium: 8.5 mg/dL — ABNORMAL LOW (ref 8.9–10.3)
Chloride: 108 mmol/L (ref 98–111)
Creatinine, Ser: 0.57 mg/dL — ABNORMAL LOW (ref 0.61–1.24)
GFR calc Af Amer: >60 mL/min (ref >60)
GFR calc non Af Amer: >60 mL/min (ref >60)
Glucose, Bld: 109 mg/dL — ABNORMAL HIGH (ref 70–99)
Potassium: 3.8 mmol/L (ref 3.5–5.1)
Sodium: 142 mmol/L (ref 135–145)
Total Bilirubin: 0.6 mg/dL (ref 0.3–1.2)
Total Protein: 6.2 g/dL — ABNORMAL LOW (ref 6.5–8.1)

## 2017-08-27 LAB — ANTINUCLEAR ANTIBODIES, IFA: ANA Ab, IFA: NEGATIVE

## 2017-08-27 LAB — TROPONIN I: Troponin I: 0.03 ng/mL (ref <0.03)

## 2017-08-27 LAB — GLUCOSE, CAPILLARY
Glucose-Capillary: 87 mg/dL (ref 70–99)
Glucose-Capillary: 116 mg/dL — ABNORMAL HIGH (ref 70–99)

## 2017-08-27 LAB — MAGNESIUM: Magnesium: 2.1 mg/dL (ref 1.7–2.4)

## 2017-08-27 MED ORDER — DOCUSATE SODIUM 100 MG PO CAPS
100.0000 mg | ORAL_CAPSULE | Freq: Two times a day (BID) | ORAL | Status: DC
  Administered 2017-08-27: 100 mg via ORAL
  Filled 2017-08-27: qty 1

## 2017-08-27 MED ORDER — TIZANIDINE HCL 4 MG PO TABS: 2.0000 mg | ORAL_TABLET | Freq: Four times a day (QID) | ORAL | Status: DC | PRN

## 2017-08-27 MED ORDER — DIAZEPAM 5 MG/ML IJ SOLN
2.5000 mg | Freq: Two times a day (BID) | INTRAMUSCULAR | Status: DC | PRN
  Administered 2017-08-27 – 2017-09-01 (×6): 2.5 mg via INTRAVENOUS
  Filled 2017-08-27 (×7): qty 2

## 2017-08-27 MED ORDER — MORPHINE SULFATE (PF) 4 MG/ML IV SOLN
4.0000 mg | Freq: Once | INTRAVENOUS | Status: AC
  Administered 2017-08-27: 4 mg via INTRAVENOUS
  Filled 2017-08-27: qty 1

## 2017-08-27 MED ORDER — HYDROMORPHONE HCL 1 MG/ML IJ SOLN
1.0000 mg | Freq: Once | INTRAMUSCULAR | Status: AC
  Administered 2017-08-27: 1 mg via INTRAVENOUS
  Filled 2017-08-27: qty 1

## 2017-08-27 MED ORDER — MORPHINE SULFATE (PF) 2 MG/ML IV SOLN
2.0000 mg | INTRAVENOUS | Status: DC | PRN
  Administered 2017-08-27 – 2017-09-01 (×8): 2 mg via INTRAVENOUS
  Filled 2017-08-27 (×8): qty 1

## 2017-08-27 MED ORDER — DEXTROSE-NACL 5-0.45 % IV SOLN
INTRAVENOUS | Status: DC
  Administered 2017-08-27 – 2017-09-02 (×9): via INTRAVENOUS

## 2017-08-27 MED ORDER — DIAZEPAM 5 MG/ML IJ SOLN
2.5000 mg | Freq: Once | INTRAMUSCULAR | Status: AC
  Administered 2017-08-27: 2.5 mg via INTRAVENOUS
  Filled 2017-08-27: qty 2

## 2017-08-27 NOTE — Progress Notes (Signed)
PROGRESS NOTE    Adam [name]  YSA:[id number] DOB: 1926-02-19 DOA: 08/25/2017 PCP: Blanchie Serve, MD   Brief Narrative: Adam [name] is a 84 y.o. male with a history of CAD status post CABG, chronic combined CHF, depression, anxiety, status post PPM, CVA, GERD, chronic Foley catheter, restless leg syndrome, chronic hypertension.  Patient presented secondary to generalized body aches and weakness.  He is found to have a likely UTI.  He was started on ceftriaxone and cultures are significant for E. coli and Klebsiella oxytocin.  He has significant neck pain in addition to odynophagia.  Neck pain appears to be musculoskeletal most likely.  GI consulted for odynophagia, which also may be musculoskeletal related.   Assessment & Plan:   Active Problems:   Cardiomyopathy, ischemic   Pacemaker   Hyperlipidemia   SSS (sick sinus syndrome) (HCC)   Hypothyroidism   GERD (gastroesophageal reflux disease)   Urinary retention   Coronary artery disease involving native coronary artery of native heart without angina pectoris   Expressive aphasia   RLS (restless legs syndrome)   Combined congestive systolic and diastolic heart failure (HCC)   Urinary tract infection   Hx of CABG   Nonrheumatic aortic valve stenosis   History of arterial ischemic stroke   Weak   Decubitus ulcer of sacral region, stage 2   Neurogenic orthostatic hypotension (HCC)   Neurogenic bladder   Generalized weakness   Shoulder pain, bilateral   Weakness Fall Unknown etiology. Possibly related to UTI. Patient with degenerative neck disease which may also be playing a role. X-ray obtained of bilateral shoulders which was significant for mild arthritis. -PT/OT  UTI Patient with a chronic indwelling catheter. Urine culture significant for E. Coli and klebsiella -Continue Ceftriaxone and await sensitivities  Odynophagia New problem. Would not be surprised if this is musculoskeletal in nature, but symptoms are severe  per patient and he is unable to swallow at all -GI consult -SLP eval  Neck pain I think most of this is musculoskeletal with some aspect of torticollis. -Zanaflex prn -Norco and morphine prn  Spondylolisthesis of cervical spine Discussed with neurosurgery, Dr. Ronnald Ramp, who recommend -Neck collar pending neurosurgery evaluation  Hypothyroidism TSH normal. -Continue Synthroid  GERD -Continue Protonix  S/p pacemaker  Restless leg syndrome -Continue Requip  History of CVA Expressive aphasia -Continue aspirin and plavix  Orthostatic hypotension Worsened symptoms since being dehydrated. -Continue midodrine and droxidopa (unable to obtain inpatient)  Hyperlipidemia -Continue pravastatin  Chronic combined systolic and diastolic heart failure History of CAD s/p CABG No chest pain. -Continue aspirin and plavix  Sacral decubitus ulcer Present on admission. Wound care.   DVT prophylaxis: Heparin subq Code Status:   Code Status: Full Code Family Communication: Wife at bedside Disposition Plan: Discharge pending improvement of pain   Consultants:   Neurosurgery (telephone)  Procedures:   None  Antimicrobials:  Ceftriaxone    Subjective: Significant neck pain. Pain with swallowing.  Objective: Vitals:   08/27/17 0446 08/27/17 0451 08/27/17 0950 08/27/17 1305  BP:  (!) 110/53 (!) 122/58 (!) 113/54  Pulse:  72 70 69  Resp:  14  15  Temp:  99.8 F (37.7 C) 98.9 F (37.2 C) 99.6 F (37.6 C)  TempSrc:  Axillary Oral Oral  SpO2:  (!) 88% 93% 91%  Weight: 78.7 kg (173 lb 8 oz)     Height:        Intake/Output Summary (Last 24 hours) at 08/27/2017 1648 Last data filed at 08/27/2017  1500 Gross per 24 hour  Intake 680 ml  Output 800 ml  Net -120 ml   Filed Weights   08/25/17 1822 08/26/17 0430 08/27/17 0446  Weight: 77.8 kg (171 lb 8.3 oz) 79.5 kg (175 lb 4.3 oz) 78.7 kg (173 lb 8 oz)    Examination:  General exam: Appears calm and  comfortable Respiratory system: Clear to auscultation. Respiratory effort normal. Cardiovascular system: S1 & S2 heard, RRR. 2/6 systolic murmur, rubs, gallops or clicks. Gastrointestinal system: Abdomen is nondistended, soft and nontender. Normal bowel sounds heard. Central nervous system: Alert and oriented. No focal neurological deficits. Musculoskeletal: No LE edema. No calf tenderness. Neck tenderness on anterior palpation (SCM bilaterally). No point tenderness of c-spine or posterior musculature Skin: No cyanosis. No rashes Psychiatry: Judgement and insight appear normal. Mood & affect appropriate.     Data Reviewed: I have personally reviewed following labs and imaging studies  CBC: Recent Labs  Lab 08/25/17 1346 08/27/17 0423  WBC 7.0 8.9  NEUTROABS  --  6.5  HGB 13.3 12.1*  HCT 40.3 36.9*  MCV 97.6 97.1  PLT 206 630   Basic Metabolic Panel: Recent Labs  Lab 08/25/17 1346 08/26/17 0426 08/27/17 0423  NA 140 139 142  K 3.9 4.2 3.8  CL 106 107 108  CO2 25 25 26   GLUCOSE 108* 104* 109*  BUN 13 13 18   CREATININE 0.56* 0.49* 0.57*  CALCIUM 8.6* 8.6* 8.5*  MG  --  2.2 2.1  PHOS  --  3.4  --    GFR: Estimated Creatinine Clearance: 65.9 mL/min (A) (by C-G formula based on SCr of 0.57 mg/dL (L)). Liver Function Tests: Recent Labs  Lab 08/25/17 1346 08/26/17 0426 08/27/17 0423  AST 23 24 22   ALT 19 15 17   ALKPHOS 65 62 59  BILITOT 0.5 1.1 0.6  PROT 6.8 6.3* 6.2*  ALBUMIN 3.1* 2.7* 2.5*   No results for input(s): LIPASE, AMYLASE in the last 168 hours. No results for input(s): AMMONIA in the last 168 hours. Coagulation Profile: No results for input(s): INR, PROTIME in the last 168 hours. Cardiac Enzymes: Recent Labs  Lab 08/26/17 1439 08/26/17 2136 08/27/17 0423  CKTOTAL 102  --   --   TROPONINI 0.03* 0.03* 0.03*   BNP (last 3 results) Recent Labs    09/17/16 1517  PROBNP 721.0*   HbA1C: No results for input(s): HGBA1C in the last 72  hours. CBG: Recent Labs  Lab 08/26/17 0751 08/27/17 0737  GLUCAP 87 116*   Lipid Profile: No results for input(s): CHOL, HDL, LDLCALC, TRIG, CHOLHDL, LDLDIRECT in the last 72 hours. Thyroid Function Tests: Recent Labs    08/26/17 0758  TSH 1.125   Anemia Panel: No results for input(s): VITAMINB12, FOLATE, FERRITIN, TIBC, IRON, RETICCTPCT in the last 72 hours. Sepsis Labs: Recent Labs  Lab 08/27/17 0423  LATICACIDVEN 0.9    Recent Results (from the past 240 hour(s))  Urine Culture     Status: Abnormal (Preliminary result)   Collection Time: 08/25/17  2:29 PM  Result Value Ref Range Status   Specimen Description   Final    URINE, CLEAN CATCH Performed at [facility], [place] [address], [place], [place] [address]    Special Requests   Final    NONE Performed at [facility], [place] [address], [place], [place] [address]    Culture (A)  Final    >=100,000 COLONIES/mL ESCHERICHIA COLI >=100,000 COLONIES/mL KLEBSIELLA OXYTOCA SUSCEPTIBILITIES TO FOLLOW  Performed at [facility], [place] [address] [place], [place] [address]    Report Status PENDING  Incomplete  Blood culture (routine x 2)     Status: None (Preliminary result)   Collection Time: 08/25/17  2:47 PM  Result Value Ref Range Status   Specimen Description   Final    BLOOD LEFT ARM Performed at [place] [address] [place], [place] [address]    Special Requests   Final    BOTTLES DRAWN AEROBIC AND ANAEROBIC Blood Culture adequate volume Performed at [place] [address] [place], [place] [address]    Culture   Final    NO GROWTH 2 DAYS Performed at [place] [address] [place], [place] [address]    Report Status PENDING  Incomplete  Blood culture (routine x 2)     Status: None (Preliminary result)   Collection Time: 08/25/17  2:52 PM  Result Value Ref Range Status   Specimen Description   Final     BLOOD RIGHT ARM Performed at [place] [address], [place], [place] [address]    Special Requests   Final    BOTTLES DRAWN AEROBIC AND ANAEROBIC Blood Culture adequate volume Performed at [facility] [address], [place], [place] [address]    Culture   Final    NO GROWTH 2 DAYS Performed at [facility] [address] [place], [place] [address]    Report Status PENDING  Incomplete  MRSA PCR Screening     Status: Abnormal   Collection Time: 08/25/17  6:18 PM  Result Value Ref Range Status   MRSA by PCR POSITIVE (A) NEGATIVE Final    Comment:        The GeneXpert MRSA Assay (FDA approved for NASAL specimens only), is one component of a comprehensive MRSA colonization surveillance program. It is not intended to diagnose MRSA infection nor to guide or monitor treatment for MRSA infections. RESULT CALLED TO, READ BACK BY AND VERIFIED WITHTod Persia RN [id number] 08/25/17 A NAVARRO Performed at [facility], [place] [address] [place],  [address]          Radiology Studies: Dg Shoulder Right  Result Date: 08/25/2017 CLINICAL DATA:  84 year old male with bilateral shoulder pain. No injury. Initial encounter. EXAM: RIGHT SHOULDER - 2+ VIEW COMPARISON:  None. FINDINGS: Mild to moderate acromioclavicular joint and acromial humeral joint degenerative changes. No fracture or dislocation. Visualized lungs clear. IMPRESSION: Mild to moderate acromioclavicular joint and acromial humeral joint degenerative changes. Electronically Signed   By: Genia Del M.D.   On: 08/25/2017 18:58   Dg Shoulder Left  Result Date: 08/25/2017 CLINICAL DATA:  84 year old male with bilateral shoulder pain. No injury. Initial encounter. EXAM: LEFT SHOULDER - 2+ VIEW COMPARISON:  09/25/2006 MR. FINDINGS: Moderate acromioclavicular joint degenerative changes. Superiorly located humeral head consistent with rotator cuff tear with  acromial humeral joint degenerative changes. No fracture or dislocation. Pacemaker in place. Visualized lungs clear. IMPRESSION: Moderate acromioclavicular joint degenerative changes. Superiorly located humeral head consistent with rotator cuff tear with acromial humeral joint degenerative changes. Electronically Signed   By: Genia Del M.D.   On: 08/25/2017 19:00        Scheduled Meds: . amiodarone  100 mg Oral Daily  . aspirin EC  81 mg Oral Daily  . benzonatate  100 mg Oral BID  . Chlorhexidine Gluconate Cloth  6  each Topical [id number]  . clopidogrel  75 mg Oral Daily  . docusate sodium  100 mg Oral BID  . Droxidopa  100 mg Oral TID  . heparin  5,000 Units Subcutaneous Q8H  . levothyroxine  100 mcg Oral QAC breakfast  . Melatonin  3 mg Oral QHS  . midodrine  10 mg Oral 3 times per day  . mupirocin ointment  1 application Nasal BID  . pantoprazole  40 mg Oral BID  . polyethylene glycol  17 g Oral Daily  . pravastatin  40 mg Oral QHS  . sucralfate  1 g Oral TID WC & HS  . traZODone  50 mg Oral QHS   Continuous Infusions: . cefTRIAXone (ROCEPHIN)  IV Stopped (08/27/17 1507)  . dextrose 5 % and 0.45% NaCl 100 mL/hr at 08/27/17 1436     LOS: 2 days     Cordelia Poche, MD [facility] 08/27/2017, 4:48 PM Pager: [phone]  If 7PM-7AM, please contact night-coverage [email or web address] 08/27/2017, 4:48 PM

## 2017-08-27 NOTE — Progress Notes (Signed)
Patient noted to have delayed swallowing, painful swallowing and frequent coughing several minutes after swallowing. Patient panics and feels like he can't breath when he starts coughing.   Patient and wife state he "has had trouble swallowing for a long time." Patient states he mostly gets choked on food. Will notify covering provider.

## 2017-08-27 NOTE — Progress Notes (Deleted)
Patients family request a neurology consult if PCP thinks that is appropriate.

## 2017-08-27 NOTE — Plan of Care (Signed)
Pt was still in a great deal of pain last night.  It is mainly his neck and throat. Pt finds it hard to swallow w/o having pain. Pt's neck is very rigid and he is c/o severe pain. Family states that this is very out of character for him. Pt is A&O X 4, but has made several statements to family that don't make sense, such as: while speaking to daughter on the phone he told her he couldn't find a working phone. ( RN was present in the room). He also told daughter that if wife was coming to stay at the hospital she needed to come in an ambulance. Pt is not eating or drinking well, d/t pain. On call ordered Morphine, Dilaudid and Valium. At this time pt is resting.

## 2017-08-27 NOTE — Progress Notes (Signed)
CRITICAL VALUE ALERT  Critical Value:  Troponin 0.03  Date & Time Notied:  08/27/17 0610  Provider Notified: Raliegh Ip Schorr  Orders Received/Actions taken: No new orders

## 2017-08-28 LAB — URINE CULTURE: Culture: >=100000 — AB

## 2017-08-28 LAB — GLUCOSE, CAPILLARY: Glucose-Capillary: 110 mg/dL — ABNORMAL HIGH (ref 70–99)

## 2017-08-28 MED ORDER — METHYLPREDNISOLONE SODIUM SUCC 40 MG IJ SOLR
40.0000 mg | Freq: Every day | INTRAMUSCULAR | Status: DC
  Administered 2017-08-28 – 2017-09-02 (×6): 40 mg via INTRAVENOUS
  Filled 2017-08-28 (×6): qty 1

## 2017-08-28 MED ORDER — PREDNISONE 20 MG PO TABS: 20.0000 mg | ORAL_TABLET | Freq: Every day | ORAL | Status: DC

## 2017-08-28 NOTE — Care Management Important Message (Signed)
Important Message  Patient Details  Name: Adam [name] MRN: [id number] Date of Birth: [birth year]   Medicare Important Message Given:  Yes    Kerin Salen 08/28/2017, 11:00 AMImportant Message  Patient Details  Name: Adam [name] MRN: [id number] Date of Birth: 1926/08/26   Medicare Important Message Given:  Yes    Kerin Salen 08/28/2017, 10:59 AM

## 2017-08-28 NOTE — Plan of Care (Signed)
  Problem: Education: Goal: Knowledge of General Education information will improve Description Including pain rating scale, medication(s)/side effects and non-pharmacologic comfort measures Outcome: Progressing   Problem: Clinical Measurements: Goal: Ability to maintain clinical measurements within normal limits will improve Outcome: Progressing Goal: Will remain free from infection Outcome: Progressing Goal: Diagnostic test results will improve Outcome: Progressing Goal: Cardiovascular complication will be avoided Outcome: Progressing   Problem: Elimination: Goal: Will not experience complications related to bowel motility Outcome: Progressing   Problem: Safety: Goal: Ability to remain free from injury will improve Outcome: Progressing   Problem: Skin Integrity: Goal: Risk for impaired skin integrity will decrease Outcome: Progressing   Problem: Health Behavior/Discharge Planning: Goal: Ability to manage health-related needs will improve Outcome: Not Progressing   Problem: Clinical Measurements: Goal: Respiratory complications will improve Outcome: Not Progressing   Problem: Activity: Goal: Risk for activity intolerance will decrease Outcome: Not Progressing   Problem: Nutrition: Goal: Adequate nutrition will be maintained Outcome: Not Progressing   Problem: Coping: Goal: Level of anxiety will decrease Outcome: Not Progressing   Problem: Pain Managment: Goal: General experience of comfort will improve Outcome: Not Progressing

## 2017-08-28 NOTE — Evaluation (Addendum)
Physical Therapy Evaluation Patient Details Name: Adam [name] MRN: [id number] DOB: 25-Feb-1926 Today's Date: 08/28/2017   History of Present Illness  84yo male with chief complaints of weakness and genearlized pain. Diagnosed with acute UTI; shoulders negative for fracture B, however does have cervical spondylolisthesis and neurosurgical consult is pending. PMH OA, pacemaker, CABG, CHF, depression/anxiety, hx dyspnea, gout, silent MI, aortical valve stenosis, orthostatic hypotension, RLS, [place] spotted fever, sick sinus syndrome, hx CVA with expressive aphasia   Clinical Impression   Patient received in bed with wife present in room; patient pleasant but reluctant to participate in PT evaluation today due to high pain levels. He interacts well with PT but does mumble/seem to have mild expressive difficulties and wife assisted in provided details today. Patient severely weak per general MMT, and required MaxA for rolling in bed today. He declined further mobility today due to pain, RN aware of patient status. Currently awaiting neurosurgical consult on cervical spine and further mobility was also deferred today pending neurosurgeon's recommendations for bracing or other equipment. He was left in bed with RN present and attending. He will continue to benefit from skilled PT services to address functional deficits and progress mobility, and will likely require ST-SNF moving forward.     Follow Up Recommendations SNF    Equipment Recommendations  None recommended by PT(defer to next venue )    Recommendations for Other Services       Precautions / Restrictions Precautions Precautions: Cervical;ICD/Pacemaker;Fall Precaution Booklet Issued: No Precaution Comments: cervical spondyoloisthesis- may be getting cervical collar/further cervical intervention  Restrictions Weight Bearing Restrictions: No      Mobility  Bed Mobility Overal bed mobility: Needs Assistance Bed Mobility:  Rolling Rolling: Max assist         General bed mobility comments: pain limited   Transfers                 General transfer comment: DNT due to pain/patient refusing further mobility   Ambulation/Gait             General Gait Details: DNT due to pain/patient refusing further mobility   Stairs            Wheelchair Mobility    Modified Rankin (Stroke Patients Only)       Balance Overall balance assessment: History of Falls                                           Pertinent Vitals/Pain Pain Assessment: 0-10 Pain Score: 8  Pain Location: generalized pain/all over  Pain Descriptors / Indicators: Sharp;Discomfort;Sore Pain Intervention(s): Limited activity within patient's tolerance;Repositioned;Monitored during session;Patient requesting pain meds-RN notified    Home Living Family/patient expects to be discharged to:: Assisted living               Home Equipment: Walker - 4 wheels;Grab bars - toilet;Grab bars - tub/shower;Wheelchair - manual Additional Comments: [facility] with wife     Prior Function Level of Independence: Independent with assistive device(s)         Comments: uses walker in home, WC for longer distances; was very motivated and walked shorter distances multiple times a day      Hand Dominance   Dominant Hand: Right    Extremity/Trunk Assessment   Upper Extremity Assessment Upper Extremity Assessment: Defer to OT evaluation    Lower  Extremity Assessment Lower Extremity Assessment: RLE deficits/detail;LLE deficits/detail;Generalized weakness RLE Deficits / Details: ankle DF 4/5, quads/hams/hip abductors and extensors 2/5 but limited by pain  RLE: Unable to fully assess due to pain RLE Sensation: decreased proprioception RLE Coordination: decreased gross motor LLE Deficits / Details: ankle DF 4/5, quads/hams/hip abductions and extensors 2/5 but limited by pain  LLE:  Unable to fully assess due to pain LLE Sensation: decreased proprioception LLE Coordination: decreased gross motor       Communication   Communication: No difficulties  Cognition Arousal/Alertness: Awake/alert Behavior During Therapy: Flat affect;Anxious Overall Cognitive Status: Within Functional Limits for tasks assessed                                 General Comments: seems intact but very anxious; complicated by some expressive aphasia and mumbling and sometimes had to look to wife for clarification       General Comments      Exercises     Assessment/Plan    PT Assessment Patient needs continued PT services  PT Problem List Decreased strength;Decreased mobility;Decreased safety awareness;Decreased coordination;Decreased knowledge of precautions;Decreased activity tolerance;Decreased balance;Pain       PT Treatment Interventions DME instruction;Therapeutic activities;Gait training;Therapeutic exercise;Patient/family education;Stair training;Balance training;Functional mobility training;Neuromuscular re-education    PT Goals (Current goals can be found in the Care Plan section)  Acute Rehab PT Goals Patient Stated Goal: get pain under control  PT Goal Formulation: With patient/family Time For Goal Achievement: 09/11/17 Potential to Achieve Goals: Fair    Frequency Min 2X/week   Barriers to discharge        Co-evaluation               AM-PAC PT "6 Clicks" Daily Activity  Outcome Measure Difficulty turning over in bed (including adjusting bedclothes, sheets and blankets)?: Unable Difficulty moving from lying on back to sitting on the side of the bed? : Unable Difficulty sitting down on and standing up from a chair with arms (e.g., wheelchair, bedside commode, etc,.)?: Unable Help needed moving to and from a bed to chair (including a wheelchair)?: Total Help needed walking in hospital room?: Total Help needed climbing 3-5 steps with a railing?  : Total 6 Click Score: 6    End of Session   Activity Tolerance: Patient limited by pain Patient left: in bed;with family/visitor present;with nursing/sitter in room Nurse Communication: Mobility status;Patient requests pain meds PT Visit Diagnosis: Unsteadiness on feet (R26.81);Muscle weakness (generalized) (M62.81);History of falling (Z91.81);Difficulty in walking, not elsewhere classified (R26.2)    Time: 4497-5300 PT Time Calculation (min) (ACUTE ONLY): 18 min   Charges:   PT Evaluation $PT Eval Moderate Complexity: 1 Mod     PT G Codes:        Deniece Ree PT, DPT, CBIS  Supplemental Physical Therapist [place]   Pager [phone]

## 2017-08-28 NOTE — Evaluation (Signed)
SLP Cancellation Note  Patient Details Name: Adam [name] MRN: [id number] DOB: 10-10-1926   Cancelled treatment:       Reason Eval/Treat Not Completed: Other (comment)(order for swallow eval received, pt npo at this time, ? for GI to see, if desire oropharyngeal eval rec MBS)   Macario Golds 08/28/2017, 12:06 PM   Luanna Salk, [place] [facility] SLP [phone]

## 2017-08-28 NOTE — Consult Note (Signed)
[facility] Consultation Note  Referring Provider: Dr. Lonny Prude [facility]) Primary Care Physician:  Blanchie Serve, MD  Reason for Consultation:  Dysphagia/odynophagia  HPI: Adam [name] is a 84 y.o. male with multiple comorbidities as described above.  Presented to hospital with few day history of neck pain, arm pain, trouble swallowing pills.  No troubles with dysphagia/odynophagia before per patient/wife.  Reports egd many years ago, unclear reason.  No abdominal pain or blood in stool.   Past Medical History:  Diagnosis Date  . Arthritis    "minor, back and sometimes knees" (12/15/2012)  . Bradycardia    AFib/SSS s/p St Jude PPM 04/12/2008  . CAD (coronary artery disease) 12/30/2014   CABG (LIMA-LAD, SVG-RCA, SVG-OM in 1996).  07/2009 BMS to SVG-RCA. Cath in 04/2010 with patent stents   . Cardiomyopathy, ischemic 08/25/2012  . CHF (congestive heart failure) ([place])   . Chronic knee pain 12/03/2014  . Combined congestive systolic and diastolic heart failure ([place]) 02/02/2015   Hx EF 41%. BNP 96.8 02/21/15 Torsemide 04/06/15 Na 142, K 4.6, Bun 16, creat 0.89 04/20/14 BNP 111.7, Na 142, K 4.6, Bun 16, creat 0.9   . Depression with anxiety 02/02/2015   02/21/15 Hgb A1c 5.8 03/10/15 MMSE 30/30   . Dizziness, after diuretic asscoiated with hypotension and responded to fluid bolus 06/05/2011   04/28/15 US carotid R+L normal bilateral arterial velocities.    Marland Kitchen Dyspnea 08/11/2014   Followed in Pulmonary clinic/ [facility]/ [place]  - 08/11/2014  Walked RA x 1 laps @ 185 ft each stopped due to fatigue/off balance/ slow pace/  no sob or desat  - PFT's  09/26/2014  FEV1 2.26 (85 % ) ratio 76  p no % improvement from saba with DLCO  67 % corrects to 93 % for alv volume      Since prev study 08/04/13 minimal change lung vol or dlco    . Embolic cerebral infarction ([place]) 12/06/2015  . Exertional shortness of breath    "sometimes walking" (12/15/2012)  . GERD (gastroesophageal reflux disease)   . Gout 02/09/2015   . Heart murmur    "just told I had one today" (12/15/2012)  . Hiatal hernia   . Hyperlipidemia   . Hypertension   . Hypothyroid   . Influenza A 03/10/2016  . Insomnia   . Melanoma of back ([place]) 1976  . Myocardial infarction [facility]) 1996; 2011   "both silent" (12/15/2012)  . Nonrheumatic aortic valve stenosis   . Orthostatic hypotension   . Osteoporosis, senile   . Pacemaker   . RBBB   . Restless leg 02/02/2015  . Right leg weakness 12/06/2015  . [facility] spotted fever   . S/P CABG x 4   . Sick sinus syndrome ([place]) 01/31/2014  . Sustained ventricular tachycardia ([place]) 07/27/2014  . Urinary retention 12/30/2014    Past Surgical History:  Procedure Laterality Date  . CARDIAC CATHETERIZATION  04/2010   LIMA to LAD patent,SVG to OM patent,no in-stnet restenosis RCA  . CATARACT EXTRACTION W/ INTRAOCULAR LENS  IMPLANT, BILATERAL Bilateral 2012  . CORONARY ANGIOPLASTY WITH STENT PLACEMENT  07/2009   bare metal stent to SVG to the RCA  . CORONARY ARTERY BYPASS GRAFT  1996   LIMA to LAD,SVG to RCA & SVG to OM  . INSERT / REPLACE / REMOVE PACEMAKER  2010  . MELANOMA EXCISION  05/1974 X2   "taken off my back" (12/15/2012)  . NM MYOVIEW LTD  06/2011   low risk  .  PPM GENERATOR CHANGEOUT N/A 01/22/2017   Procedure: PPM GENERATOR CHANGEOUT;  Surgeon: [name], MD;  Location: [place] [facility];  Service: Cardiovascular;  Laterality: N/A;  . TONSILLECTOMY  1938  . TRANSURETHRAL RESECTION OF PROSTATE  1986  . US ECHOCARDIOGRAPHY  07/11/2009   EF 45-50%    Prior to Admission medications   Medication Sig Start Date End Date Taking? Authorizing Provider  acetaminophen (TYLENOL) 500 MG tablet Take 1,000 mg by mouth 2 (two) times daily.   Yes [provider]  amiodarone (PACERONE) 100 MG tablet Take 1 tablet (100 mg total) by mouth daily. 01/16/17  Yes Croitoru, Mihai, MD  aspirin EC 81 MG tablet Take 1 tablet (81 mg total) by mouth daily. 01/12/16  Yes Reyne Dumas, MD   Cholecalciferol (VITAMIN D3) 5000 UNITS CAPS Take 5,000 Units by mouth every Monday.    Yes [provider]  clopidogrel (PLAVIX) 75 MG tablet TAKE 1 TABLET ONCE DAILY. 06/13/17  Yes Blanchie Serve, MD  Droxidopa 100 MG CAPS Take 100 mg by mouth 3 (three) times daily. 08/12/17  Yes Croitoru, Mihai, MD  levothyroxine (SYNTHROID, LEVOTHROID) 100 MCG tablet Take 100 mcg by mouth daily before breakfast.   Yes [provider]  Lidocaine 4 % PTCH Apply 1 patch topically daily. Left shoulder   Yes [provider]  Melatonin 3 MG TABS Take 3 mg by mouth at bedtime.   Yes [provider]  midodrine (PROAMATINE) 10 MG tablet TAKE 1 TABLET THREE TIMES DAILY. TAKE LAST DOSE AT LEAST 6 HOURS BEFORE BEDTIME. 06/13/17  Yes Pandey, Mahima, MD  pantoprazole (PROTONIX) 40 MG tablet TAKE 1 TABLET BY MOUTH TWICE DAILY. 02/28/17  Yes Kilroy, Luke K, PA-C  polyethylene glycol (MIRALAX / GLYCOLAX) packet Take 17 g by mouth daily. Hold for loose stool   Yes [provider]  pravastatin (PRAVACHOL) 40 MG tablet Take 1 tablet (40 mg total) by mouth at bedtime. 10/21/16  Yes Blanchie Serve, MD  sennosides-docusate sodium (SENOKOT-S) 8.6-50 MG tablet Take 2 tablets by mouth at bedtime.    Yes [provider]  torsemide (DEMADEX) 10 MG tablet Take 5 mg by mouth daily.    Yes [provider]  traZODone (DESYREL) 50 MG tablet Take 1 tablet (50 mg total) by mouth at bedtime. 05/19/17  Yes Blanchie Serve, MD  acetaminophen (TYLENOL) 325 MG tablet Take 650 mg by mouth every 8 (eight) hours as needed for mild pain, moderate pain or headache.     [provider]  rOPINIRole (REQUIP) 0.5 MG tablet TAKE 1 TABLET 3 TIMES A DAY AS NEEDED. 07/08/17   Blanchie Serve, MD    Current Facility-Administered Medications  Medication Dose Route Frequency Provider Last Rate Last Dose  . acetaminophen (TYLENOL) tablet 650 mg  650 mg Oral Q8H PRN Raiford Noble [place], DO   650 mg at  08/25/17 2231  . amiodarone (PACERONE) tablet 100 mg  100 mg Oral Daily Raiford Noble [place], DO   100 mg at 08/27/17 2585  . aspirin EC tablet 81 mg  81 mg Oral Daily Raiford Noble [place], DO   81 mg at 08/27/17 2778  . benzonatate (TESSALON) capsule 100 mg  100 mg Oral BID Lavina Hamman, MD   100 mg at 08/27/17 2423  . cefTRIAXone (ROCEPHIN) 1 g in sodium chloride 0.9 % 100 mL IVPB  1 g Intravenous Q24H Raiford Noble [place], DO   Stopped at 08/27/17 1507  . Chlorhexidine Gluconate Cloth 2 %  PADS 6 each  6 each Topical Q0600 Raiford Noble [place], [place]   6 each at 08/28/17 1017  . clopidogrel (PLAVIX) tablet 75 mg  75 mg Oral Daily Raiford Noble [place], DO   75 mg at 08/27/17 5102  . dextrose 5 %-0.45 % sodium chloride infusion   Intravenous Continuous Mariel Aloe, MD 100 mL/hr at 08/27/17 1436    . diazepam (VALIUM) injection 2.5 mg  2.5 mg Intravenous Q12H PRN Mariel Aloe, MD   2.5 mg at 08/27/17 1813  . docusate sodium (COLACE) capsule 100 mg  100 mg Oral BID Schorr, Rhetta Mura, NP   100 mg at 08/27/17 0939  . Droxidopa CAPS 100 mg  100 mg Oral TID Raiford Noble Latif, DO      . guaiFENesin (ROBITUSSIN) 100 MG/5ML solution 100 mg  5 mL Oral Q4H PRN Lavina Hamman, MD   100 mg at 08/26/17 2344  . heparin injection 5,000 Units  5,000 Units Subcutaneous [address] [place], [place]   5,000 Units at 08/28/17 [phone]  . HYDROcodone-acetaminophen (NORCO/VICODIN) 5-325 MG per tablet 1 tablet  1 tablet Oral Q4H PRN Lavina Hamman, MD   1 tablet at 08/27/17 0909  . levothyroxine (SYNTHROID, LEVOTHROID) tablet 100 mcg  100 mcg Oral QAC breakfast Raiford Noble [place], DO   100 mcg at 08/27/17 7782  . magic mouthwash w/lidocaine  15 mL Oral QID PRN Schorr, Rhetta Mura, NP   15 mL at 08/26/17 2151  . Melatonin TABS 3 mg  3 mg Oral QHS Sheikh, Georgina Quint [place], DO   3 mg at 08/26/17 2150  . midodrine (PROAMATINE) tablet 10 mg  10 mg Oral 3 times per day Raiford Noble Latif, DO   10 mg at 08/27/17 1437  . morphine  2 MG/ML injection 2 mg  2 mg Intravenous Q4H PRN Mariel Aloe, MD   2 mg at 08/28/17 0413  . mupirocin ointment (BACTROBAN) 2 % 1 application  1 application Nasal BID Raiford Noble [place], [place]   1 application at 42/35/36 2209  . ondansetron (ZOFRAN) tablet 4 mg  4 mg Oral Q6H PRN Raiford Noble Latif, DO       Or  . ondansetron [facility]) injection 4 mg  4 mg Intravenous Q6H PRN Sheikh, Omair Latif, DO      . pantoprazole (PROTONIX) EC tablet 40 mg  40 mg Oral BID Raiford Noble [place], DO   40 mg at 08/27/17 1443  . phenol (CHLORASEPTIC) mouth spray 1 spray  1 spray Mouth/Throat PRN Sheikh, Omair Latif, DO      . polyethylene glycol (MIRALAX / GLYCOLAX) packet 17 g  17 g Oral Daily Raiford Noble [place], DO   17 g at 08/27/17 0941  . pravastatin (PRAVACHOL) tablet 40 mg  40 mg Oral QHS SheikhGeorgina Quint [place], DO   40 mg at 08/26/17 2149  . predniSONE (DELTASONE) tablet 20 mg  20 mg Oral Q breakfast Mariel Aloe, MD      . rOPINIRole (REQUIP) tablet 0.5 mg  0.5 mg Oral TID PRN Raiford Noble Latif, DO   0.5 mg at 08/26/17 2149  . sucralfate (CARAFATE) 1 GM/10ML suspension 1 g  1 g Oral TID WC & HS Lavina Hamman, MD   1 g at 08/27/17 0850  . traZODone (DESYREL) tablet 50 mg  50 mg Oral QHS Raiford Noble [place], DO   50 mg at 08/26/17 2149    Allergies as of 08/25/2017 - Review Complete 08/25/2017  Allergen Reaction Noted  . Altace [ramipril] Cough 08/19/2012  . Crestor [rosuvastatin calcium] Rash 11/22/2014  . Penicillins Rash and Other (See Comments) 06/05/2011  . Sulfa antibiotics Rash 06/05/2011    Family History  Problem Relation Age of Onset  . Coronary artery disease Mother   . Diabetes Mother   . Heart disease Mother   . Coronary artery disease Father   . Diabetes Father   . Lung cancer Father   . Heart disease Father   . Anuerysm Son   . Heart disease Brother     Social History   Socioeconomic History  . Marital status: Married    Spouse name: Not on file  . Number of  children: 2  . Years of education: Masters  . Highest education level: Not on file  Occupational History  . Occupation: Retired Company secretary -Pensions consultant  Social Needs  . Financial resource strain: Not on file  . Food insecurity:    Worry: Not on file    Inability: Not on file  . Transportation needs:    Medical: Not on file    Non-medical: Not on file  Tobacco Use  . Smoking status: Never Smoker  . Smokeless tobacco: Never Used  Substance and Sexual Activity  . Alcohol use: No    Alcohol/week: 0.0 oz  . Drug use: No  . Sexual activity: Never  Lifestyle  . Physical activity:    Days per week: Not on file    Minutes per session: Not on file  . Stress: Not on file  Relationships  . Social connections:    Talks on phone: Not on file    Gets together: Not on file    Attends religious service: Not on file    Active member of club or organization: Not on file    Attends meetings of clubs or organizations: Not on file    Relationship status: Not on file  . Intimate partner violence:    Fear of current or ex partner: Not on file    Emotionally abused: Not on file    Physically abused: Not on file    Forced sexual activity: Not on file  Other Topics Concern  . Not on file  Social History Narrative   Lives at [facility] 01/09/15   Married - Violet   Never smoked   Alcohol none   Previously employed as Scientist, forensic for [employer].         Diet:Low sodium   Do you drink/eat things with caffeine? No   Marital status: Married                              What year were you married?1950   Do you live in a house, apartment, assisted living, condo, trailer, etc)?    Is it one or more stories? 1   How many persons live in your home? 2   Do you have any pets in your home? No   Current or past profession: Minister, [employer]   Do you exercise?     Very Little                                                 Type & how often:    Do you  have  a living will?  Yes   Do you have a DNR Form? Yes   Do you have a POA/HPOA forms? Yes    Review of Systems: As per HPI, all others negative  Physical Exam: Vital signs in last 24 hours: Temp:  [98.3 F (36.8 C)-99.7 F (37.6 C)] 98.3 F (36.8 C) (07/25 0617) Pulse Rate:  [69-73] 70 (07/25 0617) Resp:  [15-20] 20 (07/25 0617) BP: (113-151)/(54-69) 144/69 (07/25 0617) SpO2:  [90 %-91 %] 90 % (07/25 0617) Weight:  [78.8 kg (173 lb 11.6 oz)] 78.8 kg (173 lb 11.6 oz) (07/25 0626) Last BM Date: 08/26/17 General:  Awake, elderly, chronically ill-appearing, NAD Head:  Normocephalic and atraumatic. Eyes:  Sclera clear, no icterus.   Conjunctiva pink. Ears:  Normal auditory acuity. Nose:  No deformity, discharge,  or lesions. Mouth:  No deformity or lesions.  Oropharynx profoundly dry; no obvious thrush or oral ulcers/lesions Neck:  Supple; no masses or thyromegaly. Abdomen:  Soft, nontender and nondistended. No masses, hepatosplenomegaly or hernias noted. Normal bowel sounds, without guarding, and without rebound.   Indwelling foley. Msk:  Symmetrical without gross deformities. Normal posture. Pulses:  Normal pulses noted. Extremities:  Without clubbing or edema. Neurologic:  Alert and  oriented x4; diffusely weak, essentially bedbound, mild-to-moderate expressive aphasia,  Skin:  Diffuse ecchymoses, otherwise intact without significant lesions or rashes. Psych:  Alert and cooperative. Normal mood and affect.   Lab Results: Recent Labs    08/25/17 1346 08/27/17 0423  WBC 7.0 8.9  HGB 13.3 12.1*  HCT 40.3 36.9*  PLT 206 243   BMET Recent Labs    08/25/17 1346 08/26/17 0426 08/27/17 0423  NA 140 139 142  K 3.9 4.2 3.8  CL 106 107 108  CO2 25 25 26   GLUCOSE 108* 104* 109*  BUN 13 13 18   CREATININE 0.56* 0.49* 0.57*  CALCIUM 8.6* 8.6* 8.5*   LFT Recent Labs    08/27/17 0423  PROT 6.2*  ALBUMIN 2.5*  AST 22  ALT 17  ALKPHOS 59  BILITOT 0.6   PT/INR No  results for input(s): LABPROT, INR in the last 72 hours.  Studies/Results: No results found.  Impression:  1.  Dysphagia/odynophagia.  Happened abruptly in conjunction with neck and bilateral arm pain.  No prior history of dysphagia.  Possible neurologic/musculoskeletal source. 2.  Protein-calorie malnutrition. 3.  CAD on clopidigrel. 4.  Multiple comorbidities.  Plan:  1.  Patient is dehydrated and otherwise appears in no shape for any kind of endoscopic procedure any time soon.  Moreover, I'm not convinced this is a primary esophageal process like stricture/ring for which endoscopy would be diagnostic and therapeutic. 2.  Treatment of dehydration.   3.  Agree with Speech Therapy evaluation.  Hopefully they'll think he can at least have some liquid oral nutrition. 4.  If not felt to have oropharyngeal dysphagia, could consider barium swallow in the next couple days.  However, even if esophageal stricture/ring is seen, he is a very poor endoscopic candidate, and I would overall be very reluctant to perform endoscopy unless there were significant compelling evidence that doing so would appreciably help his swallowing. 5.  [facility] will follow at a distance; please call us back if needed; thank you for the consult.   LOS: 3 days   Khyla Mccumbers M  08/28/2017, 12:15 PM  Cell [phone] If no answer or after 5 PM call [phone]

## 2017-08-28 NOTE — Progress Notes (Signed)
PROGRESS NOTE    Adam [name]  OZD:[id number] DOB: 1926-06-26 DOA: 08/25/2017 PCP: Blanchie Serve, MD   Brief Narrative: Adam [name] is a 84 y.o. male with a history of CAD status post CABG, chronic combined CHF, depression, anxiety, status post PPM, CVA, GERD, chronic Foley catheter, restless leg syndrome, chronic hypertension.  Patient presented secondary to generalized body aches and weakness.  He is found to have a likely UTI.  He was started on ceftriaxone and cultures are significant for E. coli and Klebsiella oxytocin.  He has significant neck pain in addition to odynophagia.  Neck pain appears to be musculoskeletal most likely.  GI consulted for odynophagia, which also may be musculoskeletal related.   Assessment & Plan:   Active Problems:   Cardiomyopathy, ischemic   Pacemaker   Hyperlipidemia   SSS (sick sinus syndrome) (HCC)   Hypothyroidism   GERD (gastroesophageal reflux disease)   Urinary retention   Coronary artery disease involving native coronary artery of native heart without angina pectoris   Expressive aphasia   RLS (restless legs syndrome)   Combined congestive systolic and diastolic heart failure (HCC)   Urinary tract infection   Hx of CABG   Nonrheumatic aortic valve stenosis   History of arterial ischemic stroke   Weak   Decubitus ulcer of sacral region, stage 2   Neurogenic orthostatic hypotension (HCC)   Neurogenic bladder   Generalized weakness   Shoulder pain, bilateral   Weakness Fall Unknown etiology. Possibly related to UTI. Patient with degenerative neck disease which may also be playing a role. X-ray obtained of bilateral shoulders which was significant for mild arthritis. -PT/OT eval  UTI Patient with a chronic indwelling catheter. Urine culture significant for E. Coli and klebsiella. Both are sensitive to ceftriaxone. -Continue ceftriaxone  Odynophagia Dysphagia New problem. ?musculoskeletal in nature, but symptoms are severe per  patient and he is unable to swallow at all. Discussed with radiology, and no evidence of infection of posterior pharynx on CT.  -GI consulted -SLP eval pending -If no improvement with steroid treatment, will get repeat imaging  Neck pain I think most of this is musculoskeletal with some aspect of torticollis. Question possible rheumatologic etiology. -Valium prn -Norco and morphine prn -Will start on steroid therapy empirically and see if there is improvement  Spondylolisthesis of cervical spine Discussed with neurosurgery, Dr. Ronnald Ramp, who recommend outpatient follow-up. -Soft neck collar for comfort  Hypothyroidism TSH normal. -Continue Synthroid  GERD -Continue Protonix  S/p pacemaker  Restless leg syndrome -Continue Requip  History of CVA Expressive aphasia -Continue aspirin and plavix  Orthostatic hypotension -Continue midodrine and droxidopa (unable to obtain inpatient)  Hyperlipidemia -Continue pravastatin. Doubt this is contributing.  Chronic combined systolic and diastolic heart failure History of CAD s/p CABG No chest pain. -Continue aspirin and plavix  Moisture associated skin damage Present on gluteal fold and right buttock crease. Present on admission. Wound care.   DVT prophylaxis: Heparin subq Code Status:   Code Status: Full Code Family Communication: Wife at bedside Disposition Plan: Discharge pending improvement of pain   Consultants:   Neurosurgery (telephone)  Gastroenterology  Procedures:   None  Antimicrobials:  Ceftriaxone    Subjective: Continues to have neck and hip pain.  Objective: Vitals:   08/27/17 1305 08/27/17 2043 08/28/17 0613 08/28/17 0617  BP: (!) 113/54 (!) 151/65  (!) 144/69  Pulse: 69 73  70  Resp: 15 16  20   Temp: 99.6 F (37.6 C) 99.7 F (37.6 C)  98.3 F (36.8 C)  TempSrc: Oral Oral  Oral  SpO2: 91% 90%  90%  Weight:   78.8 kg (173 lb 11.6 oz)   Height:        Intake/Output Summary (Last 24  hours) at 08/28/2017 0840 Last data filed at 08/28/2017 0330 [place] per 24 hour  Intake 440 ml  Output 1025 ml  Net -585 ml   Filed Weights   08/26/17 0430 08/27/17 0446 08/28/17 0613  Weight: 79.5 kg (175 lb 4.3 oz) 78.7 kg (173 lb 8 oz) 78.8 kg (173 lb 11.6 oz)    Examination:  General exam: Appears calm and comfortable Respiratory system: Clear to auscultation. Respiratory effort normal. Cardiovascular system: S1 & S2 heard, RRR. No murmurs, rubs, gallops or clicks. Gastrointestinal system: Abdomen is nondistended, soft and nontender. No organomegaly or masses felt. Normal bowel sounds heard. Central nervous system: Alert and oriented. No focal neurological deficits. Musculoskeletal: Bilateral anterior neck and shoulder pain. Pain around lateral aspect of hips bilaterally Skin: No cyanosis. No rashes Psychiatry: Judgement and insight appear normal. Mood & affect appropriate.     Data Reviewed: I have personally reviewed following labs and imaging studies  CBC: Recent Labs  Lab 08/25/17 1346 08/27/17 0423  WBC 7.0 8.9  NEUTROABS  --  6.5  HGB 13.3 12.1*  HCT 40.3 36.9*  MCV 97.6 97.1  PLT 206 093   Basic Metabolic Panel: Recent Labs  Lab 08/25/17 1346 08/26/17 0426 08/27/17 0423  NA 140 139 142  K 3.9 4.2 3.8  CL 106 107 108  CO2 25 25 26   GLUCOSE 108* 104* 109*  BUN 13 13 18   CREATININE 0.56* 0.49* 0.57*  CALCIUM 8.6* 8.6* 8.5*  MG  --  2.2 2.1  PHOS  --  3.4  --    GFR: Estimated Creatinine Clearance: 65.9 mL/min (A) (by C-G formula based on SCr of 0.57 mg/dL (L)). Liver Function Tests: Recent Labs  Lab 08/25/17 1346 08/26/17 0426 08/27/17 0423  AST 23 24 22   ALT 19 15 17   ALKPHOS 65 62 59  BILITOT 0.5 1.1 0.6  PROT 6.8 6.3* 6.2*  ALBUMIN 3.1* 2.7* 2.5*   No results for input(s): LIPASE, AMYLASE in the last 168 hours. No results for input(s): AMMONIA in the last 168 hours. Coagulation Profile: No results for input(s): INR, PROTIME in the last  168 hours. Cardiac Enzymes: Recent Labs  Lab 08/26/17 1439 08/26/17 2136 08/27/17 0423  CKTOTAL 102  --   --   TROPONINI 0.03* 0.03* 0.03*   BNP (last 3 results) Recent Labs    09/17/16 1517  PROBNP 721.0*   HbA1C: No results for input(s): HGBA1C in the last 72 hours. CBG: Recent Labs  Lab 08/26/17 0751 08/27/17 0737 08/28/17 0808  GLUCAP 87 116* 110*   Lipid Profile: No results for input(s): CHOL, HDL, LDLCALC, TRIG, CHOLHDL, LDLDIRECT in the last 72 hours. Thyroid Function Tests: Recent Labs    08/26/17 0758  TSH 1.125   Anemia Panel: No results for input(s): VITAMINB12, FOLATE, FERRITIN, TIBC, IRON, RETICCTPCT in the last 72 hours. Sepsis Labs: Recent Labs  Lab 08/27/17 0423  LATICACIDVEN 0.9    Recent Results (from the past 240 hour(s))  Urine Culture     Status: Abnormal   Collection Time: 08/25/17  2:29 PM  Result Value Ref Range Status   Specimen Description   Final    URINE, CLEAN CATCH Performed at [facility], [place] [address] [place], [place] [address]  Special Requests   Final    NONE Performed at [facility], [place] [address], [place], [place] [address]    Culture (A)  Final    >=100,000 COLONIES/mL ESCHERICHIA COLI >=100,000 COLONIES/mL KLEBSIELLA OXYTOCA    Report Status 08/28/2017 FINAL  Final   Organism ID, Bacteria ESCHERICHIA COLI (A)  Final   Organism ID, Bacteria KLEBSIELLA OXYTOCA (A)  Final      Susceptibility   Escherichia coli - MIC*    AMPICILLIN <=2 SENSITIVE Sensitive     CEFAZOLIN <=4 SENSITIVE Sensitive     CEFTRIAXONE <=1 SENSITIVE Sensitive     CIPROFLOXACIN <=0.25 SENSITIVE Sensitive     GENTAMICIN <=1 SENSITIVE Sensitive     IMIPENEM <=0.25 SENSITIVE Sensitive     NITROFURANTOIN <=16 SENSITIVE Sensitive     TRIMETH/SULFA <=20 SENSITIVE Sensitive     AMPICILLIN/SULBACTAM <=2 SENSITIVE Sensitive     PIP/TAZO <=4 SENSITIVE Sensitive     Extended ESBL NEGATIVE Sensitive      * >=100,000 COLONIES/mL ESCHERICHIA COLI   Klebsiella oxytoca - MIC*    AMPICILLIN RESISTANT Resistant     CEFAZOLIN >=64 RESISTANT Resistant     CEFTRIAXONE <=1 SENSITIVE Sensitive     CIPROFLOXACIN <=0.25 SENSITIVE Sensitive     GENTAMICIN <=1 SENSITIVE Sensitive     IMIPENEM <=0.25 SENSITIVE Sensitive     NITROFURANTOIN 32 SENSITIVE Sensitive     TRIMETH/SULFA <=20 SENSITIVE Sensitive     AMPICILLIN/SULBACTAM 4 SENSITIVE Sensitive     PIP/TAZO <=4 SENSITIVE Sensitive     Extended ESBL NEGATIVE Sensitive     * >=100,000 COLONIES/mL KLEBSIELLA OXYTOCA  Blood culture (routine x 2)     Status: None (Preliminary result)   Collection Time: 08/25/17  2:47 PM  Result Value Ref Range Status   Specimen Description   Final    BLOOD LEFT ARM Performed at [facility] [address], [place], [place] [address]    Special Requests   Final    BOTTLES DRAWN AEROBIC AND ANAEROBIC Blood Culture adequate volume Performed at [facility] [address] [place], [place] [address]    Culture   Final    NO GROWTH 2 DAYS Performed at [facility] [address] [place], [place] [address]    Report Status PENDING  Incomplete  Blood culture (routine x 2)     Status: None (Preliminary result)   Collection Time: 08/25/17  2:52 PM  Result Value Ref Range Status   Specimen Description   Final    BLOOD RIGHT ARM Performed at [facility] [address] [place], [place] [address]    Special Requests   Final    BOTTLES DRAWN AEROBIC AND ANAEROBIC Blood Culture adequate volume Performed at [facility] [address], [place], [place] [address]    Culture   Final    NO GROWTH 2 DAYS Performed at [facility] [address], [place], [place] [address]    Report Status PENDING  Incomplete  MRSA PCR Screening     Status: Abnormal   Collection Time: 08/25/17  6:18 PM  Result Value Ref Range Status   MRSA by  PCR POSITIVE (A) NEGATIVE Final    Comment:        The GeneXpert MRSA Assay (FDA approved for NASAL specimens only), is one component of a comprehensive MRSA colonization surveillance program. It is not intended to diagnose MRSA infection nor to guide or  monitor treatment for MRSA infections. RESULT CALLED TO, READ BACK BY AND VERIFIED WITHTod Persia RN [id number] 08/25/17 A NAVARRO Performed at [facility], [place] [address] [place], [place] [address]          Radiology Studies: No results found.      Scheduled Meds: . amiodarone  100 mg Oral Daily  . aspirin EC  81 mg Oral Daily  . benzonatate  100 mg Oral BID  . Chlorhexidine Gluconate Cloth  6 each Topical Q0600  . clopidogrel  75 mg Oral Daily  . docusate sodium  100 mg Oral BID  . Droxidopa  100 mg Oral TID  . heparin  5,000 Units Subcutaneous Q8H  . levothyroxine  100 mcg Oral QAC breakfast  . Melatonin  3 mg Oral QHS  . midodrine  10 mg Oral 3 times per day  . mupirocin ointment  1 application Nasal BID  . pantoprazole  40 mg Oral BID  . polyethylene glycol  17 g Oral Daily  . pravastatin  40 mg Oral QHS  . sucralfate  1 g Oral TID WC & HS  . traZODone  50 mg Oral QHS   Continuous Infusions: . cefTRIAXone (ROCEPHIN)  IV Stopped (08/27/17 1507)  . dextrose 5 % and 0.45% NaCl 100 mL/hr at 08/27/17 1436     LOS: 3 days     Cordelia Poche, MD [facility] 08/28/2017, 8:40 AM Pager: [phone]  If 7PM-7AM, please contact night-coverage [email or web address] 08/28/2017, 8:40 AM

## 2017-08-28 NOTE — Evaluation (Signed)
Occupational Therapy Evaluation Patient Details Name: Adam [name] MRN: [id number] DOB: July 23, 1926 Today's Date: 08/28/2017    History of Present Illness 84yo male with chief complaints of weakness and genearlized pain. Diagnosed with acute UTI; shoulders negative for fracture B, however does have cervical spondylolisthesis and neurosurgical consult is pending. PMH OA, pacemaker, CABG, CHF, depression/anxiety, hx dyspnea, gout, silent MI, aortical valve stenosis, orthostatic hypotension, RLS, rocky mountain spotted fever, sick sinus syndrome, hx CVA with expressive aphasia    Clinical Impression  Pt admitted with the above. Pt currently with functional limitations due to the deficits listed below (see OT Problem List).  Pt will benefit from skilled OT to increase their safety and independence with ADL and functional mobility for ADL to facilitate discharge to venue listed below.      Follow Up Recommendations  SNF    Equipment Recommendations  None recommended by OT    Recommendations for Other Services       Precautions / Restrictions Precautions Precautions: Cervical;ICD/Pacemaker;Fall Precaution Booklet Issued: No Precaution Comments: cervical spondyoloisthesis- may be getting cervical collar/further cervical intervention  Restrictions Weight Bearing Restrictions: No      Mobility Bed Mobility Overal bed mobility: Needs Assistance Bed Mobility: Rolling Rolling: Max assist         General bed mobility comments: pain limited   Transfers                 General transfer comment: DNT due to pain/patient refusing further mobility     Balance Overall balance assessment: History of Falls                                         ADL either performed or assessed with clinical judgement   ADL Overall ADL's : Needs assistance/impaired Eating/Feeding: Maximal assistance;Bed level   Grooming: Maximal assistance;Bed level                                  General ADL Comments: pt was walking to bathroom at friends home     Vision Patient Visual Report: No change from baseline       Perception     Praxis      Pertinent Vitals/Pain Pain Assessment: 0-10 Pain Score: 5  Pain Location: generalized pain/all over  Pain Descriptors / Indicators: Sharp;Discomfort;Sore Pain Intervention(s): Limited activity within patient's tolerance;Monitored during session;Repositioned     Hand Dominance Right   Extremity/Trunk Assessment Upper Extremity Assessment Upper Extremity Assessment: LUE deficits/detail;RUE deficits/detail RUE Deficits / Details: limited active ROM.  Wife stated pt did feed self Ily with R UE. OT positionined BUE on pillows for support . both shoulders painful with ROM RUE Coordination: decreased gross motor LUE Coordination: decreased gross motor   Lower Extremity Assessment Lower Extremity Assessment: RLE deficits/detail;LLE deficits/detail;Generalized weakness RLE Deficits / Details: ankle DF 4/5, quads/hams/hip abductors and extensors 2/5 but limited by pain  RLE: Unable to fully assess due to pain RLE Sensation: decreased proprioception RLE Coordination: decreased gross motor LLE Deficits / Details: ankle DF 4/5, quads/hams/hip abductions and extensors 2/5 but limited by pain  LLE: Unable to fully assess due to pain LLE Sensation: decreased proprioception LLE Coordination: decreased gross motor       Communication Communication Communication: No difficulties   Cognition Arousal/Alertness: Awake/alert Behavior During Therapy: Anxious Overall Cognitive Status: Within  Functional Limits for tasks assessed                                 General Comments: seems intact but very anxious; complicated by some expressive aphasia and mumbling and sometimes had to look to wife for clarification    General Comments       Exercises     Shoulder Instructions      Home Living  Family/patient expects to be discharged to:: Assisted living                             Home Equipment: Walker - 4 wheels;Grab bars - toilet;Grab bars - tub/shower;Wheelchair - manual   Additional Comments: Overly independent living with wife       Prior Functioning/Environment Level of Independence: Independent with assistive device(s)        Comments: uses walker in home, WC for longer distances; was very motivated and walked shorter distances multiple times a day         OT Problem List: Decreased strength;Decreased activity tolerance;Impaired balance (sitting and/or standing);Decreased safety awareness;Decreased knowledge of use of DME or AE;Impaired UE functional use      OT Treatment/Interventions: Self-care/ADL training;Patient/family education;DME and/or AE instruction;Therapeutic exercise    OT Goals(Current goals can be found in the care plan section) Acute Rehab OT Goals Patient Stated Goal: get pain under control   OT Frequency: Min 2X/week   Barriers to D/C:               AM-PAC PT "6 Clicks" Daily Activity     Outcome Measure Help from another person eating meals?: A Lot Help from another person taking care of personal grooming?: A Lot Help from another person toileting, which includes using toliet, bedpan, or urinal?: Total Help from another person bathing (including washing, rinsing, drying)?: Total Help from another person to put on and taking off regular upper body clothing?: Total Help from another person to put on and taking off regular lower body clothing?: Total 6 Click Score: 8   End of Session Nurse Communication: Mobility status  Activity Tolerance: Patient limited by pain;Patient limited by fatigue Patient left: in bed;with call bell/phone within reach;with family/visitor present  OT Visit Diagnosis: Unsteadiness on feet (R26.81);Muscle weakness (generalized) (M62.81);History of falling (Z91.81);Other abnormalities of  gait and mobility (R26.89);Repeated falls (R29.6);Feeding difficulties (R63.3)                Time: 1100-1118 OT Time Calculation (min): 18 min Charges:  OT General Charges $OT Visit: 1 Visit OT Evaluation $OT Eval Moderate Complexity: [place], [place]  Betsy Pries 08/28/2017, 11:35 AM

## 2017-08-29 ENCOUNTER — Inpatient Hospital Stay (HOSPITAL_COMMUNITY): Payer: Medicare Other

## 2017-08-29 ENCOUNTER — Encounter: Payer: Self-pay | Admitting: Cardiology

## 2017-08-29 LAB — BLOOD CULTURE ID PANEL (REFLEXED)
Acinetobacter baumannii: NOT DETECTED
Candida albicans: NOT DETECTED
Candida glabrata: NOT DETECTED
Candida krusei: NOT DETECTED
Candida parapsilosis: NOT DETECTED
Candida tropicalis: NOT DETECTED
Carbapenem resistance: NOT DETECTED
Enterobacter cloacae complex: NOT DETECTED
Enterobacteriaceae species: DETECTED — AB
Enterococcus species: NOT DETECTED
Escherichia coli: NOT DETECTED
Haemophilus influenzae: NOT DETECTED
Klebsiella oxytoca: DETECTED — AB
Klebsiella pneumoniae: NOT DETECTED
Listeria monocytogenes: NOT DETECTED
Neisseria meningitidis: NOT DETECTED
Proteus species: NOT DETECTED
Pseudomonas aeruginosa: NOT DETECTED
Serratia marcescens: NOT DETECTED
Staphylococcus aureus (BCID): NOT DETECTED
Staphylococcus species: NOT DETECTED
Streptococcus agalactiae: NOT DETECTED
Streptococcus pneumoniae: NOT DETECTED
Streptococcus pyogenes: NOT DETECTED
Streptococcus species: NOT DETECTED

## 2017-08-29 LAB — BASIC METABOLIC PANEL
Anion gap: 9 (ref 5–15)
BUN: 15 mg/dL (ref 8–23)
CO2: 25 mmol/L (ref 22–32)
Calcium: 8.8 mg/dL — ABNORMAL LOW (ref 8.9–10.3)
Chloride: 106 mmol/L (ref 98–111)
Creatinine, Ser: 0.55 mg/dL — ABNORMAL LOW (ref 0.61–1.24)
GFR calc Af Amer: >60 mL/min (ref >60)
GFR calc non Af Amer: >60 mL/min (ref >60)
Glucose, Bld: 113 mg/dL — ABNORMAL HIGH (ref 70–99)
Potassium: 3.8 mmol/L (ref 3.5–5.1)
Sodium: 140 mmol/L (ref 135–145)

## 2017-08-29 LAB — GLUCOSE, CAPILLARY: Glucose-Capillary: 124 mg/dL — ABNORMAL HIGH (ref 70–99)

## 2017-08-29 LAB — CBC
HCT: 40.5 % (ref 39.0–52.0)
Hemoglobin: 13.7 g/dL (ref 13.0–17.0)
MCH: 32.2 pg (ref 26.0–34.0)
MCHC: 33.8 g/dL (ref 30.0–36.0)
MCV: 95.3 fL (ref 78.0–100.0)
Platelets: 322 10*3/uL (ref 150–400)
RBC: 4.25 MIL/uL (ref 4.22–5.81)
RDW: 14.2 % (ref 11.5–15.5)
WBC: 11.5 10*3/uL — ABNORMAL HIGH (ref 4.0–10.5)

## 2017-08-29 MED ORDER — LEVOTHYROXINE SODIUM 100 MCG IV SOLR
50.0000 ug | Freq: Every day | INTRAVENOUS | Status: DC
  Administered 2017-08-30 – 2017-09-01 (×3): 50 ug via INTRAVENOUS
  Filled 2017-08-29 (×4): qty 5

## 2017-08-29 MED ORDER — ACETAMINOPHEN 650 MG RE SUPP: 650.0000 mg | Freq: Four times a day (QID) | RECTAL | Status: DC | PRN

## 2017-08-29 MED ORDER — ASPIRIN 300 MG RE SUPP
300.0000 mg | Freq: Every day | RECTAL | Status: DC
  Administered 2017-08-29 – 2017-09-01 (×4): 300 mg via RECTAL
  Filled 2017-08-29 (×5): qty 1

## 2017-08-29 MED ORDER — IOHEXOL 300 MG/ML  SOLN
75.0000 mL | Freq: Once | INTRAMUSCULAR | Status: AC | PRN
  Administered 2017-08-29: 75 mL via INTRAVENOUS

## 2017-08-29 MED ORDER — BISACODYL 10 MG RE SUPP: 10.0000 mg | Freq: Every day | RECTAL | Status: DC | PRN

## 2017-08-29 MED ORDER — SODIUM CHLORIDE 0.9 % IV SOLN
2.0000 g | INTRAVENOUS | Status: DC
  Administered 2017-08-29 – 2017-09-02 (×6): 2 g via INTRAVENOUS
  Filled 2017-08-29 (×2): qty 2
  Filled 2017-08-29: qty 20
  Filled 2017-08-29 (×3): qty 2

## 2017-08-29 NOTE — Consult Note (Signed)
Modified Barium Swallow Progress Note  Patient Details  Name: Adam [name] MRN: [id number] Date of Birth: August 28, 1926  Today's Date: 08/29/2017  Modified Barium Swallow completed.  Full report located under Chart Review in the Imaging Section.  Brief recommendations include the following:  Clinical Impression Limited MBS completed, due to severity of pharyngeal dysphagia. Extensive education was provided before and after this study. Pt reports tolerance of regular diet and thin liquids prior to admit, without difficulty or odynophagia. At this time, pt reports generalized pain 8/10, with significant odynophagia (14/10).  Prior to start of po presentations, pt was noted to exhibit a wet, thick voice quality and frequent throat clearing, raising concern for poor management of secretions. Suction was set up; oral care was completed to minimize bacterial load given high risk of aspiration. Oral care effectively removed thick secretions from soft palate and dried secretions from the oral cavity.   Small (1/2  to 1 tsp) boluses of nectar thick liquid were provided individually via spoon, with discoordinated oral control and manipulation noted, as well as premature spillage over the tongue base, delayed swallow reflex, penetration and aspiration after the swallow, and post-swallow residue. Reflexive cough was elicited in response to aspiration. A small (1/8 tsp) bolus of puree was then given. Following discoordinated oral prep and propulsion, the bolus was noted to spill to the pyriform sinus prior to triggering the swallow reflex. Aspiration of pyriform stasis was noted, with cough demonstrated. Study was terminated at this point given exceptionally high risk of aspiration.   SLP reviewed MBS results and provided extensive education on the significance of pt's swallowing deficit, need for NPO status at this time, and possible need to consider nonoral feeding methods.  Recommend consideration of Palliative  Care consult to facilitate establishment of appropriate goals of care. ST will follow acutely for education, trial of dysphagia therapy (which may be a challenge given severity of odynophagia).   Swallow Evaluation Recommendations Strict NPO   SLP Diet Recommendations: NPO;Alternative means - temporary    Medication Administration: Via alternative means    Postural Changes: (remain at 45 degrees at all times, due to poor secretion management)   Oral Care Recommendations: Oral care QID;Staff/trained caregiver to provide oral care   Other Recommendations: Have oral suction available - within reach of pt at all times  Dewan Emond B. Quentin Ore [facility], CCC-SLP Speech Language Pathologist [phone]  Shonna Chock 08/29/2017,11:08 AM

## 2017-08-29 NOTE — Progress Notes (Signed)
PROGRESS NOTE    [name]  KZL:[id number] DOB: 11/04/1926 DOA: 08/25/2017 PCP: Blanchie Serve, MD   Brief Narrative: Adam [name] is a 84 y.o. male with a history of CAD status post CABG, chronic combined CHF, depression, anxiety, status post PPM, CVA, GERD, chronic Foley catheter, restless leg syndrome, chronic hypertension.  Patient presented secondary to generalized body aches and weakness.  He is found to have a likely UTI.  He was started on ceftriaxone and cultures are significant for E. coli and Klebsiella oxytocin.  He has significant neck pain in addition to odynophagia.  Neck pain appears to be musculoskeletal most likely.  GI consulted for odynophagia, which also may be musculoskeletal related.   Assessment & Plan:   Active Problems:   Cardiomyopathy, ischemic   Pacemaker   Hyperlipidemia   SSS (sick sinus syndrome) (HCC)   Hypothyroidism   GERD (gastroesophageal reflux disease)   Urinary retention   Coronary artery disease involving native coronary artery of native heart without angina pectoris   Expressive aphasia   RLS (restless legs syndrome)   Combined congestive systolic and diastolic heart failure (HCC)   Urinary tract infection   Hx of CABG   Nonrheumatic aortic valve stenosis   History of arterial ischemic stroke   Weak   Decubitus ulcer of sacral region, stage 2   Neurogenic orthostatic hypotension (HCC)   Neurogenic bladder   Generalized weakness   Shoulder pain, bilateral   Weakness Fall Unknown etiology. Possibly related to UTI. Patient with degenerative neck disease which may also be playing a role. X-ray obtained of bilateral shoulders which was significant for mild arthritis. Per wife, she does not think his weakness is significantly far from baseline. -PT/OT eval  UTI Patient with a chronic indwelling catheter. Urine culture significant for E. Coli and klebsiella. Both are sensitive to ceftriaxone. -Continue ceftriaxone  Klebsiella  bacteremia Secondary to UTI -Antibiotics as above  Odynophagia Dysphagia New problem. ?musculoskeletal in nature, but symptoms are severe per patient and he is unable to swallow at all. GI without recommendations. Speech therapy recommending NPO -Palliative care consult -Possible need for temporary NG tube  Neck pain I think most of this is musculoskeletal with some aspect of torticollis. Question possible rheumatologic etiology. -Valium prn -Norco and morphine prn -Continue steroid therapy empirically and see if there is improvement -CT soft tissue neck w/ contrast  Spondylolisthesis of cervical spine Discussed with neurosurgery, Dr. Ronnald Ramp, who recommend outpatient follow-up. -Soft neck collar for comfort  Hypothyroidism TSH normal. -Continue Synthroid  GERD -Continue Protonix  S/p pacemaker  Restless leg syndrome -Continue Requip  History of CVA Expressive aphasia -Continue aspirin and plavix  Orthostatic hypotension -Continue midodrine and droxidopa (unable to obtain inpatient) if able to take by mouth  Hyperlipidemia -Continue pravastatin. Doubt this is contributing. Held while NPO  Chronic combined systolic and diastolic heart failure History of CAD s/p CABG No chest pain. -Continue aspirin and plavix (held while NPO)  Moisture associated skin damage Present on gluteal fold and right buttock crease. Present on admission. Wound care.   DVT prophylaxis: Heparin subq Code Status:   Code Status: Full Code Family Communication: Wife at bedside Disposition Plan: Discharge pending improvement of pain   Consultants:   Neurosurgery (telephone)  Gastroenterology  Palliative care medicine  Procedures:   None  Antimicrobials:  Ceftriaxone    Subjective: Pain is stable.  Objective: Vitals:   08/29/17 0115 08/29/17 0155 08/29/17 0449 08/29/17 1359  BP:   139/77 Marland Kitchen)  142/72  Pulse:   68 70  Resp:   18 16  Temp: (!) 97.5 F (36.4 C) 98.6 F (37  C) 98.5 F (36.9 C) 97.7 F (36.5 C)  TempSrc: Oral Rectal Oral Oral  SpO2:   94% 95%  Weight:      Height:        Intake/Output Summary (Last 24 hours) at 08/29/2017 1534 Last data filed at 08/29/2017 1252 Gross per 24 hour  Intake 586.67 ml  Output 940 ml  Net -353.33 ml   Filed Weights   08/26/17 0430 08/27/17 0446 08/28/17 0613  Weight: 79.5 kg (175 lb 4.3 oz) 78.7 kg (173 lb 8 oz) 78.8 kg (173 lb 11.6 oz)    Examination:  General exam: Appears calm and comfortable Respiratory system: Clear to auscultation. Respiratory effort normal. Cardiovascular system: S1 & S2 heard, RRR. No murmurs, rubs, gallops or clicks. Gastrointestinal system: Abdomen is nondistended, soft and nontender. No organomegaly or masses felt. Normal bowel sounds heard. Central nervous system: Alert and oriented. No focal neurological deficits. Musculoskeletal: No edema. No calf tenderness. Tenderness of trapezius muscles and SCM muscles bilaterally. Weakness of shoulder extension which is worse on left than right Skin: No cyanosis. No rashes Psychiatry: Judgement and insight appear normal. Mood & affect appropriate.     Data Reviewed: I have personally reviewed following labs and imaging studies  CBC: Recent Labs  Lab 08/25/17 1346 08/27/17 0423 08/29/17 1022  WBC 7.0 8.9 11.5*  NEUTROABS  --  6.5  --   HGB 13.3 12.1* 13.7  HCT 40.3 36.9* 40.5  MCV 97.6 97.1 95.3  PLT 206 243 147   Basic Metabolic Panel: Recent Labs  Lab 08/25/17 1346 08/26/17 0426 08/27/17 0423 08/29/17 1022  NA 140 139 142 140  K 3.9 4.2 3.8 3.8  CL 106 107 108 106  CO2 25 25 26 25   GLUCOSE 108* 104* 109* 113*  BUN 13 13 18 15   CREATININE 0.56* 0.49* 0.57* 0.55*  CALCIUM 8.6* 8.6* 8.5* 8.8*  MG  --  2.2 2.1  --   PHOS  --  3.4  --   --    GFR: Estimated Creatinine Clearance: 65.9 mL/min (A) (by C-G formula based on SCr of 0.55 mg/dL (L)). Liver Function Tests: Recent Labs  Lab 08/25/17 1346  08/26/17 0426 08/27/17 0423  AST 23 24 22   ALT 19 15 17   ALKPHOS 65 62 59  BILITOT 0.5 1.1 0.6  PROT 6.8 6.3* 6.2*  ALBUMIN 3.1* 2.7* 2.5*   No results for input(s): LIPASE, AMYLASE in the last 168 hours. No results for input(s): AMMONIA in the last 168 hours. Coagulation Profile: No results for input(s): INR, PROTIME in the last 168 hours. Cardiac Enzymes: Recent Labs  Lab 08/26/17 1439 08/26/17 2136 08/27/17 0423  CKTOTAL 102  --   --   TROPONINI 0.03* 0.03* 0.03*   BNP (last 3 results) Recent Labs    09/17/16 1517  PROBNP 721.0*   HbA1C: No results for input(s): HGBA1C in the last 72 hours. CBG: Recent Labs  Lab 08/26/17 0751 08/27/17 0737 08/28/17 0808 08/29/17 0728  GLUCAP 87 116* 110* 124*   Lipid Profile: No results for input(s): CHOL, HDL, LDLCALC, TRIG, CHOLHDL, LDLDIRECT in the last 72 hours. Thyroid Function Tests: No results for input(s): TSH, T4TOTAL, FREET4, T3FREE, THYROIDAB in the last 72 hours. Anemia Panel: No results for input(s): VITAMINB12, FOLATE, FERRITIN, TIBC, IRON, RETICCTPCT in the last 72 hours. Sepsis Labs: Recent Labs  Lab 08/27/17 0423  LATICACIDVEN 0.9    Recent Results (from the past 240 hour(s))  Urine Culture     Status: Abnormal   Collection Time: 08/25/17  2:29 PM  Result Value Ref Range Status   Specimen Description   Final    URINE, CLEAN CATCH Performed at [facility], [place] [address], [place], [place] [address]    Special Requests   Final    NONE Performed at [facility], [place] [address] [place], [place] [address]    Culture (A)  Final    >=100,000 COLONIES/mL ESCHERICHIA COLI >=100,000 COLONIES/mL KLEBSIELLA OXYTOCA    Report Status 08/28/2017 FINAL  Final   Organism ID, Bacteria ESCHERICHIA COLI (A)  Final   Organism ID, Bacteria KLEBSIELLA OXYTOCA (A)  Final      Susceptibility   Escherichia coli - MIC*    AMPICILLIN <=2 SENSITIVE Sensitive     CEFAZOLIN <=4  SENSITIVE Sensitive     CEFTRIAXONE <=1 SENSITIVE Sensitive     CIPROFLOXACIN <=0.25 SENSITIVE Sensitive     GENTAMICIN <=1 SENSITIVE Sensitive     IMIPENEM <=0.25 SENSITIVE Sensitive     NITROFURANTOIN <=16 SENSITIVE Sensitive     TRIMETH/SULFA <=20 SENSITIVE Sensitive     AMPICILLIN/SULBACTAM <=2 SENSITIVE Sensitive     PIP/TAZO <=4 SENSITIVE Sensitive     Extended ESBL NEGATIVE Sensitive     * >=100,000 COLONIES/mL ESCHERICHIA COLI   Klebsiella oxytoca - MIC*    AMPICILLIN RESISTANT Resistant     CEFAZOLIN >=64 RESISTANT Resistant     CEFTRIAXONE <=1 SENSITIVE Sensitive     CIPROFLOXACIN <=0.25 SENSITIVE Sensitive     GENTAMICIN <=1 SENSITIVE Sensitive     IMIPENEM <=0.25 SENSITIVE Sensitive     NITROFURANTOIN 32 SENSITIVE Sensitive     TRIMETH/SULFA <=20 SENSITIVE Sensitive     AMPICILLIN/SULBACTAM 4 SENSITIVE Sensitive     PIP/TAZO <=4 SENSITIVE Sensitive     Extended ESBL NEGATIVE Sensitive     * >=100,000 COLONIES/mL KLEBSIELLA OXYTOCA  Blood culture (routine x 2)     Status: None (Preliminary result)   Collection Time: 08/25/17  2:47 PM  Result Value Ref Range Status   Specimen Description   Final    BLOOD LEFT ARM Performed at [place] [address], [place], [place] [address]    Special Requests   Final    BOTTLES DRAWN AEROBIC AND ANAEROBIC Blood Culture adequate volume Performed at [place] [address] [place], [place] [address]    Culture  Setup Time AEROBIC BOTTLE ONLY GRAM NEGATIVE RODS  Final   Culture GRAM NEGATIVE RODS  Final   Report Status PENDING  Incomplete  Blood Culture ID Panel (Reflexed)     Status: Abnormal   Collection Time: 08/25/17  2:47 PM  Result Value Ref Range Status   Enterococcus species NOT DETECTED NOT DETECTED Final   Listeria monocytogenes NOT DETECTED NOT DETECTED Final   Staphylococcus species NOT DETECTED NOT DETECTED Final   Staphylococcus aureus NOT DETECTED NOT DETECTED Final    Streptococcus species NOT DETECTED NOT DETECTED Final   Streptococcus agalactiae NOT DETECTED NOT DETECTED Final   Streptococcus pneumoniae NOT DETECTED NOT DETECTED Final   Streptococcus pyogenes NOT DETECTED NOT DETECTED Final   Acinetobacter baumannii NOT DETECTED NOT DETECTED Final   Enterobacteriaceae species DETECTED (A) NOT DETECTED Final    Comment: Enterobacteriaceae represent a large family of gram-negative bacteria, not a single organism. CRITICAL RESULT CALLED  TO, READ BACK BY AND VERIFIED WITH: A PHAM PHARMD AT 0836 ON [id number] BY SJW    Enterobacter cloacae complex NOT DETECTED NOT DETECTED Final   Escherichia coli NOT DETECTED NOT DETECTED Final   Klebsiella oxytoca DETECTED (A) NOT DETECTED Final    Comment: CRITICAL RESULT CALLED TO, READ BACK BY AND VERIFIED WITH: A PHAM PHARMD AT 0836 ON [id number] BY SJW    Klebsiella pneumoniae NOT DETECTED NOT DETECTED Final   Proteus species NOT DETECTED NOT DETECTED Final   Serratia marcescens NOT DETECTED NOT DETECTED Final   Carbapenem resistance NOT DETECTED NOT DETECTED Final   Haemophilus influenzae NOT DETECTED NOT DETECTED Final   Neisseria meningitidis NOT DETECTED NOT DETECTED Final   Pseudomonas aeruginosa NOT DETECTED NOT DETECTED Final   Candida albicans NOT DETECTED NOT DETECTED Final   Candida glabrata NOT DETECTED NOT DETECTED Final   Candida krusei NOT DETECTED NOT DETECTED Final   Candida parapsilosis NOT DETECTED NOT DETECTED Final   Candida tropicalis NOT DETECTED NOT DETECTED Final    Comment: Performed at [facility], [place] [address] [place], [place] [address]  Blood culture (routine x 2)     Status: None (Preliminary result)   Collection Time: 08/25/17  2:52 PM  Result Value Ref Range Status   Specimen Description   Final    BLOOD RIGHT ARM Performed at [place] [address], [place], [place] [address]    Special Requests   Final    BOTTLES DRAWN AEROBIC AND ANAEROBIC  Blood Culture adequate volume Performed at [place] [address] [place], [place] [address]    Culture   Final    NO GROWTH 4 DAYS Performed at [facility], [place] [address], [place], [place] [address]    Report Status PENDING  Incomplete  MRSA PCR Screening     Status: Abnormal   Collection Time: 08/25/17  6:18 PM  Result Value Ref Range Status   MRSA by PCR POSITIVE (A) NEGATIVE Final    Comment:        The GeneXpert MRSA Assay (FDA approved for NASAL specimens only), is one component of a comprehensive MRSA colonization surveillance program. It is not intended to diagnose MRSA infection nor to guide or monitor treatment for MRSA infections. RESULT CALLED TO, READ BACK BY AND VERIFIED WITHTod Persia RN 1829 08/25/17 A NAVARRO Performed at [facility], [place] [address] [place], [place] [address]          Radiology Studies: Ct Soft Tissue Neck W Contrast  Result Date: 08/29/2017 CLINICAL DATA:  Neck pain and BILATERAL shoulder pain. Difficulty swallowing. EXAM: CT NECK WITH CONTRAST TECHNIQUE: Multidetector CT imaging of the neck was performed using the standard protocol following the bolus administration of intravenous contrast. CONTRAST:  17mL OMNIPAQUE IOHEXOL 300 MG/ML  SOLN COMPARISON:  None. FINDINGS: Pharynx and larynx: Normal. No mass or swelling. Salivary glands: No inflammation, mass, or stone. Thyroid: Normal. Lymph nodes: None enlarged or abnormal density. Vascular: Atherosclerosis.  Vessel patency is established. Limited intracranial: Generalized atrophy. Visualized orbits: Barely visualized, no gross abnormality. Mastoids and visualized paranasal sinuses: No layering sinus or mastoid fluid. Skeleton: Spondylosis.  No worrisome osseous lesion. Upper chest: No pneumothorax. Mild vascular congestion. No visible lung mass. Pacemaker, LEFT subclavian approach. Prior median sternotomy for CABG. Other: Healed IMPRESSION: No  pharyngeal or neck inflammatory process is evident. Airway appears patent. No esophageal air-fluid level or significant neck mass  or adenopathy is evident. Electronically Signed   By: Staci Righter M.D.   On: 08/29/2017 13:07   Dg Swallowing Func-speech Pathology  Result Date: 08/29/2017 Objective Swallowing Evaluation: Type of Study: MBS-Modified Barium Swallow Study  Patient Details Name: Adam [name] MRN: [id number] Date of Birth: 13-Jun-1926 Today's Date: 08/29/2017 Time: SLP Start Time (ACUTE ONLY): 0850 -SLP Stop Time (ACUTE ONLY): 1015 SLP Time Calculation (min) (ACUTE ONLY): 85 min Past Medical History: Past Medical History: Diagnosis Date . Arthritis   "minor, back and sometimes knees" (12/15/2012) . Bradycardia   AFib/SSS s/p St Jude PPM 04/12/2008 . CAD (coronary artery disease) 12/30/2014  CABG (LIMA-LAD, SVG-RCA, SVG-OM in 1996).  07/2009 BMS to SVG-RCA. Cath in 04/2010 with patent stents  . Cardiomyopathy, ischemic 08/25/2012 . CHF (congestive heart failure) ([place])  . Chronic knee pain 12/03/2014 . Combined congestive systolic and diastolic heart failure ([place]) 02/02/2015  Hx EF 41%. BNP 96.8 02/21/15 Torsemide 04/06/15 Na 142, K 4.6, Bun 16, creat 0.89 04/20/14 BNP 111.7, Na 142, K 4.6, Bun 16, creat 0.9  . Depression with anxiety 02/02/2015  02/21/15 Hgb A1c 5.8 03/10/15 MMSE 30/30  . Dizziness, after diuretic asscoiated with hypotension and responded to fluid bolus 06/05/2011  04/28/15 US carotid R+L normal bilateral arterial velocities.   Marland Kitchen Dyspnea 08/11/2014  Followed in Pulmonary clinic/ [facility]/ [place]  - 08/11/2014  Walked RA x 1 laps @ 185 ft each stopped due to fatigue/off balance/ slow pace/  no sob or desat  - PFT's  09/26/2014  FEV1 2.26 (85 % ) ratio 76  p no % improvement from saba with DLCO  67 % corrects to 93 % for alv volume      Since prev study 08/04/13 minimal change lung vol or dlco   . Embolic cerebral infarction ([place]) 12/06/2015 . Exertional shortness of breath   "sometimes walking"  (12/15/2012) . GERD (gastroesophageal reflux disease)  . Gout 02/09/2015 . Heart murmur   "just told I had one today" (12/15/2012) . Hiatal hernia  . Hyperlipidemia  . Hypertension  . Hypothyroid  . Influenza A 03/10/2016 . Insomnia  . Melanoma of back ([place]) 1976 . Myocardial infarction [facility]) 1996; 2011  "both silent" (12/15/2012) . Nonrheumatic aortic valve stenosis  . Orthostatic hypotension  . Osteoporosis, senile  . Pacemaker  . RBBB  . Restless leg 02/02/2015 . Right leg weakness 12/06/2015 . [facility] spotted fever  . S/P CABG x 4  . Sick sinus syndrome ([place]) 01/31/2014 . Sustained ventricular tachycardia ([place]) 07/27/2014 . Urinary retention 12/30/2014 Past Surgical History: Past Surgical History: Procedure Laterality Date . CARDIAC CATHETERIZATION  04/2010  LIMA to LAD patent,SVG to OM patent,no in-stnet restenosis RCA . CATARACT EXTRACTION W/ INTRAOCULAR LENS  IMPLANT, BILATERAL Bilateral 2012 . CORONARY ANGIOPLASTY WITH STENT PLACEMENT  07/2009  bare metal stent to SVG to the RCA . CORONARY ARTERY BYPASS GRAFT  1996  LIMA to LAD,SVG to RCA & SVG to OM . INSERT / REPLACE / REMOVE PACEMAKER  2010 . MELANOMA EXCISION  05/1974 X2  "taken off my back" (12/15/2012) . NM MYOVIEW LTD  06/2011  low risk . PPM GENERATOR CHANGEOUT N/A 01/22/2017  Procedure: PPM GENERATOR CHANGEOUT;  Surgeon: [name], MD;  Location: [facility];  Service: Cardiovascular;  Laterality: N/A; . TONSILLECTOMY  1938 . TRANSURETHRAL RESECTION OF PROSTATE  1986 . US ECHOCARDIOGRAPHY  07/11/2009  EF 45-19% HPI: 84 year old male admitted 08/25/17 with pain and weakness, UTI. PMH: CAD/CABG, CHF, depression/anxiety,  bradycardia, sick sinus syndrome, pacer, CVA, GERD, gout, heart [place]mer, [place] Spotted Fever, Melanoma, HTN, RLS, urinary retention, MI x2  Subjective: Pt and wife were seen in radiology for completion of MBS to objectively assess swallow function and safety. Pt was noted to clear his throat on a continuous basis prior to  po trials. Significant pain reported - 8/10 at rest, 14/10 with swallow Assessment / Plan / Recommendation CHL IP CLINICAL IMPRESSIONS 08/29/2017 Clinical Impression Limited MBS completed, due to severity of pharyngeal dysphagia. Extensive education was provided before and after this study. Pt reports tolerance of regular diet and thin liquids prior to admit, without difficulty or odynophagia. At this time, pt reports generalized pain 8/10, with significant odynophagia (14/10). Prior to start of po presentations, pt was noted to exhibit a wet, thick voice quality and frequent throat clearing, raising concern for poor management of secretions. Suction was set up; oral care was completed to minimize bacterial load given high risk of aspiration. Oral care effectively removed thick secretions from soft palate and dried secretions from the oral cavity. Small (1/2  to 1 tsp) boluses of nectar thick liquid were provided individually via spoon, with discoordinated oral control and manipulation noted, as well as premature spillage over the tongue base, delayed swallow reflex, penetration and aspiration after the swallow, and post-swallow residue. Reflexive cough was elicited in response to aspiration. A small (1/8 tsp) bolus of puree was then given. Following discoordinated oral prep and propulsion, the bolus was noted to spill to the pyriform sinus prior to triggering the swallow reflex. Aspiration of pyriform stasis was noted, with cough demonstrated. Study was terminated at this point given exceptionally high risk of aspiration. SLP reviewed MBS results and provided extensive education on the significance of pt's swallowing deficit, need for NPO status at this time, and possible need to consider nonoral feeding methods.  Recommend consideration of Palliative Care consult to facilitate establishment of appropriate goals of care. ST will follow acutely for education, trial of dysphagia therapy (which may be a challenge given  severity of odynophagia).  SLP Visit Diagnosis Dysphagia, pharyngeal phase (R13.13) Impact on safety and function Risk for inadequate nutrition/hydration;Severe aspiration risk   CHL IP TREATMENT RECOMMENDATION 08/29/2017 Treatment Recommendations Therapy as outlined in treatment plan below   Prognosis 08/29/2017 Prognosis for Safe Diet Advancement Fair Barriers to Reach Goals Advanced age, multiple comorbidities CHL IP DIET RECOMMENDATION 08/29/2017 SLP Diet Recommendations NPO; Alternative means - temporary   Medication Administration Via alternative means   Postural Changes Recommend HOB remain at 45 degrees at all times, given poor secretion management   CHL IP OTHER RECOMMENDATIONS 08/29/2017 Recommended Consults Palliative Care consult for [place] Oral Care Recommendations Oral care QID;Staff/trained caregiver to provide oral care Other Recommendations Have oral suction available     CHL IP FREQUENCY AND DURATION 08/29/2017 Speech Therapy Frequency (ACUTE ONLY) min 2x/week Treatment Duration 2 weeks;1 week      CHL IP ORAL PHASE 08/29/2017 Oral Phase Slow bolus formation, discoordinated posterior propulsion  CHL IP PHARYNGEAL PHASE 08/29/2017 Pharyngeal Phase Impaired Pharyngeal- Nectar Teaspoon Delayed swallow initiation-pyriform sinuses;Delayed swallow initiation-vallecula;Reduced pharyngeal peristalsis;Reduced epiglottic inversion;Reduced anterior laryngeal mobility;Reduced laryngeal elevation;Reduced airway/laryngeal closure;Reduced tongue base retraction;Penetration/Apiration after swallow;Trace aspiration;Pharyngeal residue - valleculae;Pharyngeal residue - posterior pharnyx;Pharyngeal residue - pyriform Pharyngeal Material enters airway, passes BELOW cords and not ejected out despite cough attempt by patient Pharyngeal- Puree Delayed swallow initiation-vallecula;Delayed swallow initiation-pyriform sinuses;Reduced pharyngeal peristalsis;Reduced epiglottic inversion;Reduced airway/laryngeal closure;Reduced tongue  base retraction;Reduced laryngeal elevation;Reduced anterior laryngeal mobility;Penetration/Apiration  after swallow;Trace aspiration;Pharyngeal residue - pyriform;Pharyngeal residue - posterior pharnyx;Pharyngeal residue - valleculae Pharyngeal Material enters airway, passes BELOW cords and not ejected out despite cough attempt by patient  CHL IP CERVICAL ESOPHAGEAL PHASE 08/29/2017 Cervical Esophageal Phase Impaired Nectar Teaspoon Reduced cricopharyngeal relaxation Puree Reduced cricopharyngeal relaxation Celia B. Quentin Ore [facility], CCC-SLP Speech Language Pathologist [phone] Shonna Chock 08/29/2017, 10:49 AM                   Scheduled Meds: . aspirin  300 mg Rectal Daily  . Chlorhexidine Gluconate Cloth  6 each Topical Q0600  . heparin  5,000 Units Subcutaneous Q8H  . [START ON 08/30/2017] levothyroxine  50 mcg Intravenous Daily  . methylPREDNISolone (SOLU-MEDROL) injection  40 mg Intravenous Daily  . mupirocin ointment  1 application Nasal BID   Continuous Infusions: . cefTRIAXone (ROCEPHIN)  IV 2 g (08/29/17 1529)  . dextrose 5 % and 0.45% NaCl 100 mL/hr at 08/29/17 1252     LOS: 4 days     Cordelia Poche, MD [facility] 08/29/2017, 3:34 PM Pager: [phone]  If 7PM-7AM, please contact night-coverage [email or web address] 08/29/2017, 3:34 PM

## 2017-08-29 NOTE — Progress Notes (Signed)
Pt is resident of [facility]. Plan to return at DC.  Sharren Bridge, MSW, LCSW Clinical Social Work 08/29/2017 [phone]

## 2017-08-29 NOTE — Progress Notes (Signed)
PHARMACY - PHYSICIAN COMMUNICATION CRITICAL VALUE ALERT - BLOOD CULTURE IDENTIFICATION (BCID)  Adam [name] is an 84 y.o. male who presented to [facility] on 08/25/2017 with a chief complaint of body aches.  Assessment:  3 YOM with chronic foley.  UCx with E. Coli and K. Oxytoca.  BCx now with K. oxytoca  Name of physician (or Provider) Contacted: Georgena Spurling  Current antibiotics: ceftriaxone 1gm IV q24h  Changes to prescribed antibiotics recommended:  Patient is on recommended antibiotics - No changes needed  Ceftriaxone dose increased to 2gm IV q24h   Results for orders placed or performed during the hospital encounter of 08/25/17  Blood Culture ID Panel (Reflexed) (Collected: 08/25/2017  2:47 PM)  Result Value Ref Range   Enterococcus species NOT DETECTED NOT DETECTED   Listeria monocytogenes NOT DETECTED NOT DETECTED   Staphylococcus species NOT DETECTED NOT DETECTED   Staphylococcus aureus NOT DETECTED NOT DETECTED   Streptococcus species NOT DETECTED NOT DETECTED   Streptococcus agalactiae NOT DETECTED NOT DETECTED   Streptococcus pneumoniae NOT DETECTED NOT DETECTED   Streptococcus pyogenes NOT DETECTED NOT DETECTED   Acinetobacter baumannii NOT DETECTED NOT DETECTED   Enterobacteriaceae species DETECTED (A) NOT DETECTED   Enterobacter cloacae complex NOT DETECTED NOT DETECTED   Escherichia coli NOT DETECTED NOT DETECTED   Klebsiella oxytoca DETECTED (A) NOT DETECTED   Klebsiella pneumoniae NOT DETECTED NOT DETECTED   Proteus species NOT DETECTED NOT DETECTED   Serratia marcescens NOT DETECTED NOT DETECTED   Carbapenem resistance NOT DETECTED NOT DETECTED   Haemophilus influenzae NOT DETECTED NOT DETECTED   Neisseria meningitidis NOT DETECTED NOT DETECTED   Pseudomonas aeruginosa NOT DETECTED NOT DETECTED   Candida albicans NOT DETECTED NOT DETECTED   Candida glabrata NOT DETECTED NOT DETECTED   Candida krusei NOT DETECTED NOT DETECTED   Candida parapsilosis NOT  DETECTED NOT DETECTED   Candida tropicalis NOT DETECTED NOT DETECTED    Doreene Eland, PharmD, BCPS.   08/29/2017 9:03 AM

## 2017-08-30 DIAGNOSIS — Z888 Allergy status to other drugs, medicaments and biological substances status: Secondary | ICD-10-CM

## 2017-08-30 DIAGNOSIS — R7881 Bacteremia: Secondary | ICD-10-CM

## 2017-08-30 DIAGNOSIS — B9689 Other specified bacterial agents as the cause of diseases classified elsewhere: Secondary | ICD-10-CM

## 2017-08-30 DIAGNOSIS — I2510 Atherosclerotic heart disease of native coronary artery without angina pectoris: Secondary | ICD-10-CM

## 2017-08-30 DIAGNOSIS — Z950 Presence of cardiac pacemaker: Secondary | ICD-10-CM

## 2017-08-30 DIAGNOSIS — Z951 Presence of aortocoronary bypass graft: Secondary | ICD-10-CM

## 2017-08-30 DIAGNOSIS — Z880 Allergy status to penicillin: Secondary | ICD-10-CM

## 2017-08-30 DIAGNOSIS — Z515 Encounter for palliative care: Secondary | ICD-10-CM

## 2017-08-30 DIAGNOSIS — Z882 Allergy status to sulfonamides status: Secondary | ICD-10-CM

## 2017-08-30 DIAGNOSIS — Z960 Presence of urogenital implants: Secondary | ICD-10-CM

## 2017-08-30 DIAGNOSIS — I495 Sick sinus syndrome: Secondary | ICD-10-CM

## 2017-08-30 LAB — CULTURE, BLOOD (ROUTINE X 2)
Culture: NO GROWTH
Special Requests: ADEQUATE

## 2017-08-30 LAB — GLUCOSE, CAPILLARY: Glucose-Capillary: 112 mg/dL — ABNORMAL HIGH (ref 70–99)

## 2017-08-30 MED ORDER — LORAZEPAM 2 MG/ML IJ SOLN
0.5000 mg | Freq: Once | INTRAMUSCULAR | Status: AC
  Administered 2017-08-30: 0.5 mg via INTRAVENOUS
  Filled 2017-08-30: qty 1

## 2017-08-30 NOTE — Progress Notes (Addendum)
PROGRESS NOTE    Adam [name]  ZOX:[id number] DOB: 1926/09/17 DOA: 08/25/2017 PCP: Blanchie Serve, MD   Brief Narrative: Adam [name] is a 84 y.o. male with a history of CAD status post CABG, chronic combined CHF, depression, anxiety, status post PPM, CVA, GERD, chronic Foley catheter, restless leg syndrome, chronic hypertension.  Patient presented secondary to generalized body aches and weakness.  He is found to have a likely UTI.  He was started on ceftriaxone and cultures are significant for E. coli and Klebsiella oxytoca.  He has significant neck pain in addition to odynophagia.  Neck pain appears to be musculoskeletal most likely.  GI consulted for odynophagia, which also may be musculoskeletal related.   Assessment & Plan:   Active Problems:   Cardiomyopathy, ischemic   Pacemaker   Hyperlipidemia   SSS (sick sinus syndrome) (HCC)   Hypothyroidism   GERD (gastroesophageal reflux disease)   Urinary retention   Coronary artery disease involving native coronary artery of native heart without angina pectoris   Expressive aphasia   RLS (restless legs syndrome)   Combined congestive systolic and diastolic heart failure (HCC)   Urinary tract infection   Hx of CABG   Nonrheumatic aortic valve stenosis   History of arterial ischemic stroke   Weak   Decubitus ulcer of sacral region, stage 2   Neurogenic orthostatic hypotension (HCC)   Neurogenic bladder   Generalized weakness   Shoulder pain, bilateral   Weakness Fall Unknown etiology. Possibly related to UTI. Patient with degenerative neck disease which may also be playing a role. X-ray obtained of bilateral shoulders which was significant for mild arthritis. Per wife, she does not think his weakness is significantly far from baseline. -PT/OT eval  UTI Patient with a chronic indwelling catheter. Urine culture significant for E. Coli and klebsiella. Both are sensitive to ceftriaxone. -Continue ceftriaxone  Klebsiella  oxytoca bacteremia Secondary to UTI. -Antibiotics as above -ID consult in setting of pacer -Message sent to cardiology to schedule for TEE  Odynophagia Dysphagia New problem. ?musculoskeletal in nature, but symptoms are severe per patient and he is unable to swallow at all. GI without recommendations. Speech therapy recommending NPO secondary to failed MBS -Palliative care consult -Possible need for temporary NG tube -Will recommend SLP to reevaluate today since he is doing better  Neck pain I think most of this is musculoskeletal. Question possible rheumatologic etiology. Seems to be improving with empiric steroid treatment -Valium prn -Norco and morphine prn -Continue Solu-medrol -CT soft tissue neck w/ contrast  Spondylolisthesis of cervical spine Discussed with neurosurgery, Dr. Ronnald Ramp, who recommend outpatient follow-up. -Soft neck collar for comfort  Hypothyroidism TSH normal. -Continue Synthroid  GERD -Continue Protonix  S/p pacemaker  Restless leg syndrome -Continue Requip  History of CVA Expressive aphasia -Continue aspirin and plavix  Orthostatic hypotension -Continue midodrine and droxidopa (unable to obtain inpatient) if able to take by mouth  Hyperlipidemia -Continue pravastatin. Doubt this is contributing. Held while NPO  Chronic combined systolic and diastolic heart failure History of CAD s/p CABG No chest pain. -Continue aspirin and plavix (held while NPO)  Moisture associated skin damage Present on gluteal fold and right buttock crease. Present on admission. Wound care.   DVT prophylaxis: Heparin subq Code Status:   Code Status: Full Code Family Communication: Wife at bedside Disposition Plan: Discharge pending improvement of pain   Consultants:   Neurosurgery (telephone)  Gastroenterology  Palliative care medicine  Procedures:   None  Antimicrobials:  Ceftriaxone  Subjective: Pain is improved  today  Objective: Vitals:   08/29/17 1359 08/29/17 2009 08/30/17 0453 08/30/17 0500  BP: (!) 142/72 (!) 146/79 (!) 147/86   Pulse: 70 72 75   Resp: 16 20 20    Temp: 97.7 F (36.5 C) 98.6 F (37 C) 97.6 F (36.4 C)   TempSrc: Oral     SpO2: 95% 98% 95%   Weight:    78.6 kg (173 lb 4.5 oz)  Height:        Intake/Output Summary (Last 24 hours) at 08/30/2017 1245 Last data filed at 08/30/2017 1058 Gross per 24 hour  Intake 1256.67 ml  Output 950 ml  Net 306.67 ml   Filed Weights   08/27/17 0446 08/28/17 0613 08/30/17 0500  Weight: 78.7 kg (173 lb 8 oz) 78.8 kg (173 lb 11.6 oz) 78.6 kg (173 lb 4.5 oz)    Examination:  General exam: Appears calm and comfortable Respiratory system: Clear to auscultation. Respiratory effort normal. Cardiovascular system: S1 & S2 heard, RRR. No murmurs, rubs, gallops or clicks. Gastrointestinal system: Abdomen is nondistended, soft and nontender. No organomegaly or masses felt. Normal bowel sounds heard. Central nervous system: Alert and oriented. No focal neurological deficits. Musculoskeletal: Tenderness of trapezius and anterior neck muscles is still present with better range of motion of neck Skin: No cyanosis. No rashes Psychiatry: Judgement and insight appear normal. Mood & affect appropriate.     Data Reviewed: I have personally reviewed following labs and imaging studies  CBC: Recent Labs  Lab 08/25/17 1346 08/27/17 0423 08/29/17 1022  WBC 7.0 8.9 11.5*  NEUTROABS  --  6.5  --   HGB 13.3 12.1* 13.7  HCT 40.3 36.9* 40.5  MCV 97.6 97.1 95.3  PLT 206 243 673   Basic Metabolic Panel: Recent Labs  Lab 08/25/17 1346 08/26/17 0426 08/27/17 0423 08/29/17 1022  NA 140 139 142 140  K 3.9 4.2 3.8 3.8  CL 106 107 108 106  CO2 25 25 26 25   GLUCOSE 108* 104* 109* 113*  BUN 13 13 18 15   CREATININE 0.56* 0.49* 0.57* 0.55*  CALCIUM 8.6* 8.6* 8.5* 8.8*  MG  --  2.2 2.1  --   PHOS  --  3.4  --   --    GFR: Estimated Creatinine  Clearance: 65.9 mL/min (A) (by C-G formula based on SCr of 0.55 mg/dL (L)). Liver Function Tests: Recent Labs  Lab 08/25/17 1346 08/26/17 0426 08/27/17 0423  AST 23 24 22   ALT 19 15 17   ALKPHOS 65 62 59  BILITOT 0.5 1.1 0.6  PROT 6.8 6.3* 6.2*  ALBUMIN 3.1* 2.7* 2.5*   No results for input(s): LIPASE, AMYLASE in the last 168 hours. No results for input(s): AMMONIA in the last 168 hours. Coagulation Profile: No results for input(s): INR, PROTIME in the last 168 hours. Cardiac Enzymes: Recent Labs  Lab 08/26/17 1439 08/26/17 2136 08/27/17 0423  CKTOTAL 102  --   --   TROPONINI 0.03* 0.03* 0.03*   BNP (last 3 results) Recent Labs    09/17/16 1517  PROBNP 721.0*   HbA1C: No results for input(s): HGBA1C in the last 72 hours. CBG: Recent Labs  Lab 08/26/17 0751 08/27/17 0737 08/28/17 0808 08/29/17 0728 08/30/17 0740  GLUCAP 87 116* 110* 124* 112*   Lipid Profile: No results for input(s): CHOL, HDL, LDLCALC, TRIG, CHOLHDL, LDLDIRECT in the last 72 hours. Thyroid Function Tests: No results for input(s): TSH, T4TOTAL, FREET4, T3FREE, THYROIDAB in the last 72  hours. Anemia Panel: No results for input(s): VITAMINB12, FOLATE, FERRITIN, TIBC, IRON, RETICCTPCT in the last 72 hours. Sepsis Labs: Recent Labs  Lab 08/27/17 0423  LATICACIDVEN 0.9    Recent Results (from the past 240 hour(s))  Urine Culture     Status: Abnormal   Collection Time: 08/25/17  2:29 PM  Result Value Ref Range Status   Specimen Description   Final    URINE, CLEAN CATCH Performed at [facility], [place] [address] [place], [place] [address]    Special Requests   Final    NONE Performed at [facility], [place] [address], [place], [place] [address]    Culture (A)  Final    >=100,000 COLONIES/mL ESCHERICHIA COLI >=100,000 COLONIES/mL KLEBSIELLA OXYTOCA    Report Status 08/28/2017 FINAL  Final   Organism ID, Bacteria ESCHERICHIA COLI (A)  Final    Organism ID, Bacteria KLEBSIELLA OXYTOCA (A)  Final      Susceptibility   Escherichia coli - MIC*    AMPICILLIN <=2 SENSITIVE Sensitive     CEFAZOLIN <=4 SENSITIVE Sensitive     CEFTRIAXONE <=1 SENSITIVE Sensitive     CIPROFLOXACIN <=0.25 SENSITIVE Sensitive     GENTAMICIN <=1 SENSITIVE Sensitive     IMIPENEM <=0.25 SENSITIVE Sensitive     NITROFURANTOIN <=16 SENSITIVE Sensitive     TRIMETH/SULFA <=20 SENSITIVE Sensitive     AMPICILLIN/SULBACTAM <=2 SENSITIVE Sensitive     PIP/TAZO <=4 SENSITIVE Sensitive     Extended ESBL NEGATIVE Sensitive     * >=100,000 COLONIES/mL ESCHERICHIA COLI   Klebsiella oxytoca - MIC*    AMPICILLIN RESISTANT Resistant     CEFAZOLIN >=64 RESISTANT Resistant     CEFTRIAXONE <=1 SENSITIVE Sensitive     CIPROFLOXACIN <=0.25 SENSITIVE Sensitive     GENTAMICIN <=1 SENSITIVE Sensitive     IMIPENEM <=0.25 SENSITIVE Sensitive     NITROFURANTOIN 32 SENSITIVE Sensitive     TRIMETH/SULFA <=20 SENSITIVE Sensitive     AMPICILLIN/SULBACTAM 4 SENSITIVE Sensitive     PIP/TAZO <=4 SENSITIVE Sensitive     Extended ESBL NEGATIVE Sensitive     * >=100,000 COLONIES/mL KLEBSIELLA OXYTOCA  Blood culture (routine x 2)     Status: Abnormal (Preliminary result)   Collection Time: 08/25/17  2:47 PM  Result Value Ref Range Status   Specimen Description   Final    BLOOD LEFT ARM Performed at [facility] [address], [place], [place] [address]    Special Requests   Final    BOTTLES DRAWN AEROBIC AND ANAEROBIC Blood Culture adequate volume Performed at [facility] [address] [place], [place] [address]    Culture  Setup Time   Final    AEROBIC BOTTLE ONLY GRAM NEGATIVE RODS CRITICAL RESULT CALLED TO, READ BACK BY AND VERIFIED WITH: A PHAM PHARMD AT 0836 ON [id number] BY SJW    Culture (A)  Final    KLEBSIELLA OXYTOCA SUSCEPTIBILITIES TO FOLLOW Performed at [facility], [place] [address], [place], [place] [address]    Report  Status PENDING  Incomplete  Blood Culture ID Panel (Reflexed)     Status: Abnormal   Collection Time: 08/25/17  2:47 PM  Result Value Ref Range Status   Enterococcus species NOT DETECTED NOT DETECTED Final   Listeria monocytogenes NOT DETECTED NOT DETECTED Final   Staphylococcus species NOT DETECTED NOT DETECTED Final   Staphylococcus aureus NOT DETECTED NOT DETECTED Final   Streptococcus species NOT DETECTED  NOT DETECTED Final   Streptococcus agalactiae NOT DETECTED NOT DETECTED Final   Streptococcus pneumoniae NOT DETECTED NOT DETECTED Final   Streptococcus pyogenes NOT DETECTED NOT DETECTED Final   Acinetobacter baumannii NOT DETECTED NOT DETECTED Final   Enterobacteriaceae species DETECTED (A) NOT DETECTED Final    Comment: Enterobacteriaceae represent a large family of gram-negative bacteria, not a single organism. CRITICAL RESULT CALLED TO, READ BACK BY AND VERIFIED WITH: A PHAM PHARMD AT 0836 ON [id number] BY SJW    Enterobacter cloacae complex NOT DETECTED NOT DETECTED Final   Escherichia coli NOT DETECTED NOT DETECTED Final   Klebsiella oxytoca DETECTED (A) NOT DETECTED Final    Comment: CRITICAL RESULT CALLED TO, READ BACK BY AND VERIFIED WITH: A PHAM PHARMD AT 0836 ON [id number] BY SJW    Klebsiella pneumoniae NOT DETECTED NOT DETECTED Final   Proteus species NOT DETECTED NOT DETECTED Final   Serratia marcescens NOT DETECTED NOT DETECTED Final   Carbapenem resistance NOT DETECTED NOT DETECTED Final   Haemophilus influenzae NOT DETECTED NOT DETECTED Final   Neisseria meningitidis NOT DETECTED NOT DETECTED Final   Pseudomonas aeruginosa NOT DETECTED NOT DETECTED Final   Candida albicans NOT DETECTED NOT DETECTED Final   Candida glabrata NOT DETECTED NOT DETECTED Final   Candida krusei NOT DETECTED NOT DETECTED Final   Candida parapsilosis NOT DETECTED NOT DETECTED Final   Candida tropicalis NOT DETECTED NOT DETECTED Final    Comment: Performed at [facility], [place]  [address], [place], [place] [address]  Blood culture (routine x 2)     Status: None   Collection Time: 08/25/17  2:52 PM  Result Value Ref Range Status   Specimen Description   Final    BLOOD RIGHT ARM Performed at [facility] [address] [place], [place] [address]    Special Requests   Final    BOTTLES DRAWN AEROBIC AND ANAEROBIC Blood Culture adequate volume Performed at [facility] [address], [place], [place] [address]    Culture   Final    NO GROWTH 5 DAYS Performed at [facility], [place] [address], [place], [place] [address]    Report Status 08/30/2017 FINAL  Final  MRSA PCR Screening     Status: Abnormal   Collection Time: 08/25/17  6:18 PM  Result Value Ref Range Status   MRSA by PCR POSITIVE (A) NEGATIVE Final    Comment:        The GeneXpert MRSA Assay (FDA approved for NASAL specimens only), is one component of a comprehensive MRSA colonization surveillance program. It is not intended to diagnose MRSA infection nor to guide or monitor treatment for MRSA infections. RESULT CALLED TO, READ BACK BY AND VERIFIED WITHTod Persia RN 0109 08/25/17 A NAVARRO Performed at [facility], [place] [address], [place], [place] [address]          Radiology Studies: Ct Soft Tissue Neck W Contrast  Result Date: 08/29/2017 CLINICAL DATA:  Neck pain and BILATERAL shoulder pain. Difficulty swallowing. EXAM: CT NECK WITH CONTRAST TECHNIQUE: Multidetector CT imaging of the neck was performed using the standard protocol following the bolus administration of intravenous contrast. CONTRAST:  29mL OMNIPAQUE IOHEXOL 300 MG/ML  SOLN COMPARISON:  None. FINDINGS: Pharynx and larynx: Normal. No mass or swelling. Salivary glands: No inflammation, mass, or stone. Thyroid: Normal. Lymph nodes: None enlarged or abnormal density. Vascular: Atherosclerosis.  Vessel patency is established. Limited intracranial: Generalized atrophy.  Visualized  orbits: Barely visualized, no gross abnormality. Mastoids and visualized paranasal sinuses: No layering sinus or mastoid fluid. Skeleton: Spondylosis.  No worrisome osseous lesion. Upper chest: No pneumothorax. Mild vascular congestion. No visible lung mass. Pacemaker, LEFT subclavian approach. Prior median sternotomy for CABG. Other: Healed IMPRESSION: No pharyngeal or neck inflammatory process is evident. Airway appears patent. No esophageal air-fluid level or significant neck mass or adenopathy is evident. Electronically Signed   By: Staci Righter M.D.   On: 08/29/2017 13:07   Dg Swallowing Func-speech Pathology  Result Date: 08/29/2017 Objective Swallowing Evaluation: Type of Study: MBS-Modified Barium Swallow Study  Patient Details Name: Adam [name] MRN: [id number] Date of Birth: November 14, 1926 Today's Date: 08/29/2017 Time: SLP Start Time (ACUTE ONLY): 0850 -SLP Stop Time (ACUTE ONLY): 1015 SLP Time Calculation (min) (ACUTE ONLY): 85 min Past Medical History: Past Medical History: Diagnosis Date . Arthritis   "minor, back and sometimes knees" (12/15/2012) . Bradycardia   AFib/SSS s/p St Jude PPM 04/12/2008 . CAD (coronary artery disease) 12/30/2014  CABG (LIMA-LAD, SVG-RCA, SVG-OM in 1996).  07/2009 BMS to SVG-RCA. Cath in 04/2010 with patent stents  . Cardiomyopathy, ischemic 08/25/2012 . CHF (congestive heart failure) ([place])  . Chronic knee pain 12/03/2014 . Combined congestive systolic and diastolic heart failure ([place]) 02/02/2015  Hx EF 41%. BNP 96.8 02/21/15 Torsemide 04/06/15 Na 142, K 4.6, Bun 16, creat 0.89 04/20/14 BNP 111.7, Na 142, K 4.6, Bun 16, creat 0.9  . Depression with anxiety 02/02/2015  02/21/15 Hgb A1c 5.8 03/10/15 MMSE 30/30  . Dizziness, after diuretic asscoiated with hypotension and responded to fluid bolus 06/05/2011  04/28/15 US carotid R+L normal bilateral arterial velocities.   Marland Kitchen Dyspnea 08/11/2014  Followed in Pulmonary clinic/ [facility]/ [place]  - 08/11/2014  Walked RA x 1 laps @ 185  ft each stopped due to fatigue/off balance/ slow pace/  no sob or desat  - PFT's  09/26/2014  FEV1 2.26 (85 % ) ratio 76  p no % improvement from saba with DLCO  67 % corrects to 93 % for alv volume      Since prev study 08/04/13 minimal change lung vol or dlco   . Embolic cerebral infarction ([place]) 12/06/2015 . Exertional shortness of breath   "sometimes walking" (12/15/2012) . GERD (gastroesophageal reflux disease)  . Gout 02/09/2015 . Heart murmur   "just told I had one today" (12/15/2012) . Hiatal hernia  . Hyperlipidemia  . Hypertension  . Hypothyroid  . Influenza A 03/10/2016 . Insomnia  . Melanoma of back ([place]) 1976 . Myocardial infarction [facility]) 1996; 2011  "both silent" (12/15/2012) . Nonrheumatic aortic valve stenosis  . Orthostatic hypotension  . Osteoporosis, senile  . Pacemaker  . RBBB  . Restless leg 02/02/2015 . Right leg weakness 12/06/2015 . [facility] spotted fever  . S/P CABG x 4  . Sick sinus syndrome ([place]) 01/31/2014 . Sustained ventricular tachycardia ([place]) 07/27/2014 . Urinary retention 12/30/2014 Past Surgical History: Past Surgical History: Procedure Laterality Date . CARDIAC CATHETERIZATION  04/2010  LIMA to LAD patent,SVG to OM patent,no in-stnet restenosis RCA . CATARACT EXTRACTION W/ INTRAOCULAR LENS  IMPLANT, BILATERAL Bilateral 2012 . CORONARY ANGIOPLASTY WITH STENT PLACEMENT  07/2009  bare metal stent to SVG to the RCA . CORONARY ARTERY BYPASS GRAFT  1996  LIMA to LAD,SVG to RCA & SVG to OM . INSERT / REPLACE / REMOVE PACEMAKER  2010 . MELANOMA EXCISION  05/1974 X2  "taken off my back" (12/15/2012) . NM MYOVIEW LTD  06/2011  low risk . PPM GENERATOR CHANGEOUT N/A 01/22/2017  Procedure: PPM GENERATOR CHANGEOUT;  Surgeon: [name], MD;  Location: [facility];  Service: Cardiovascular;  Laterality: N/A; . TONSILLECTOMY  1938 . TRANSURETHRAL RESECTION OF PROSTATE  1986 . US ECHOCARDIOGRAPHY  07/11/2009  EF 45-3% HPI: 84 year old male admitted 08/25/17 with pain and weakness, UTI. PMH:  CAD/CABG, CHF, depression/anxiety, bradycardia, sick sinus syndrome, pacer, CVA, GERD, gout, heart murmer, [place] Spotted Fever, Melanoma, HTN, RLS, urinary retention, MI x2  Subjective: Pt and wife were seen in radiology for completion of MBS to objectively assess swallow function and safety. Pt was noted to clear his throat on a continuous basis prior to po trials. Significant pain reported - 8/10 at rest, 14/10 with swallow Assessment / Plan / Recommendation CHL IP CLINICAL IMPRESSIONS 08/29/2017 Clinical Impression Limited MBS completed, due to severity of pharyngeal dysphagia. Extensive education was provided before and after this study. Pt reports tolerance of regular diet and thin liquids prior to admit, without difficulty or odynophagia. At this time, pt reports generalized pain 8/10, with significant odynophagia (14/10). Prior to start of po presentations, pt was noted to exhibit a wet, thick voice quality and frequent throat clearing, raising concern for poor management of secretions. Suction was set up; oral care was completed to minimize bacterial load given high risk of aspiration. Oral care effectively removed thick secretions from soft palate and dried secretions from the oral cavity. Small (1/2  to 1 tsp) boluses of nectar thick liquid were provided individually via spoon, with discoordinated oral control and manipulation noted, as well as premature spillage over the tongue base, delayed swallow reflex, penetration and aspiration after the swallow, and post-swallow residue. Reflexive cough was elicited in response to aspiration. A small (1/8 tsp) bolus of puree was then given. Following discoordinated oral prep and propulsion, the bolus was noted to spill to the pyriform sinus prior to triggering the swallow reflex. Aspiration of pyriform stasis was noted, with cough demonstrated. Study was terminated at this point given exceptionally high risk of aspiration. SLP reviewed MBS results and provided  extensive education on the significance of pt's swallowing deficit, need for NPO status at this time, and possible need to consider nonoral feeding methods.  Recommend consideration of Palliative Care consult to facilitate establishment of appropriate goals of care. ST will follow acutely for education, trial of dysphagia therapy (which may be a challenge given severity of odynophagia).  SLP Visit Diagnosis Dysphagia, pharyngeal phase (R13.13) Impact on safety and function Risk for inadequate nutrition/hydration;Severe aspiration risk   CHL IP TREATMENT RECOMMENDATION 08/29/2017 Treatment Recommendations Therapy as outlined in treatment plan below   Prognosis 08/29/2017 Prognosis for Safe Diet Advancement Fair Barriers to Reach Goals Advanced age, multiple comorbidities CHL IP DIET RECOMMENDATION 08/29/2017 SLP Diet Recommendations NPO; Alternative means - temporary   Medication Administration Via alternative means   Postural Changes Recommend HOB remain at 45 degrees at all times, given poor secretion management   CHL IP OTHER RECOMMENDATIONS 08/29/2017 Recommended Consults Palliative Care consult for [place] Oral Care Recommendations Oral care QID;Staff/trained caregiver to provide oral care Other Recommendations Have oral suction available     CHL IP FREQUENCY AND DURATION 08/29/2017 Speech Therapy Frequency (ACUTE ONLY) min 2x/week Treatment Duration 2 weeks;1 week      CHL IP ORAL PHASE 08/29/2017 Oral Phase Slow bolus formation, discoordinated posterior propulsion  CHL IP PHARYNGEAL PHASE 08/29/2017 Pharyngeal Phase Impaired Pharyngeal- Nectar Teaspoon Delayed swallow initiation-pyriform sinuses;Delayed swallow initiation-vallecula;Reduced pharyngeal  peristalsis;Reduced epiglottic inversion;Reduced anterior laryngeal mobility;Reduced laryngeal elevation;Reduced airway/laryngeal closure;Reduced tongue base retraction;Penetration/Apiration after swallow;Trace aspiration;Pharyngeal residue - valleculae;Pharyngeal residue -  posterior pharnyx;Pharyngeal residue - pyriform Pharyngeal Material enters airway, passes BELOW cords and not ejected out despite cough attempt by patient Pharyngeal- Puree Delayed swallow initiation-vallecula;Delayed swallow initiation-pyriform sinuses;Reduced pharyngeal peristalsis;Reduced epiglottic inversion;Reduced airway/laryngeal closure;Reduced tongue base retraction;Reduced laryngeal elevation;Reduced anterior laryngeal mobility;Penetration/Apiration after swallow;Trace aspiration;Pharyngeal residue - pyriform;Pharyngeal residue - posterior pharnyx;Pharyngeal residue - valleculae Pharyngeal Material enters airway, passes BELOW cords and not ejected out despite cough attempt by patient  CHL IP CERVICAL ESOPHAGEAL PHASE 08/29/2017 Cervical Esophageal Phase Impaired Nectar Teaspoon Reduced cricopharyngeal relaxation Puree Reduced cricopharyngeal relaxation [facility], CCC-SLP Speech Language Pathologist [phone] Shonna Chock 08/29/2017, 10:49 AM                   Scheduled Meds: . aspirin  300 mg Rectal Daily  . Chlorhexidine Gluconate Cloth  6 each Topical Q0600  . heparin  5,000 Units Subcutaneous Q8H  . levothyroxine  50 mcg Intravenous Daily  . methylPREDNISolone (SOLU-MEDROL) injection  40 mg Intravenous Daily   Continuous Infusions: . cefTRIAXone (ROCEPHIN)  IV Stopped (08/29/17 1559)  . dextrose 5 % and 0.45% NaCl 100 mL/hr at 08/29/17 1834     LOS: 5 days     Cordelia Poche, MD [facility] 08/30/2017, 12:45 PM Pager: [phone]  If 7PM-7AM, please contact night-coverage [email or web address] 08/30/2017, 12:45 PM

## 2017-08-30 NOTE — Consult Note (Signed)
Consultation Note Date: 08/30/2017   Patient Name: Adam [name]  DOB: 26-Sep-1926  MRN: [id number]  Age / Sex: 84 y.o., male  PCP: Adam Serve, MD Referring Physician: Mariel Aloe, MD  Reason for Consultation: Establishing goals of care  HPI/Patient Profile: 84 y.o. male admitted on 08/25/2017  Clinical Assessment and Goals of Care:  Adam [name] is a 84 y.o. male with a history of CAD status post CABG, chronic combined CHF, depression, anxiety, status post PPM, CVA, GERD, chronic Foley catheter, restless leg syndrome, chronic hypertension.  Patient presented secondary to generalized body aches and weakness.  He is found to have a likely UTI.  Patient has had a PPM placed for SSS in 2010. He also has history of CAD, chronic indwelling foley. Patient has bacteremia with Klebsiella likely from a urinary source.   He has been seen by ID and plans are underway for TEE, remains on Rocephin. Hospital course also complicated by generalized weakness, dysphagia and odynophagia.   A palliative consult has been placed for goals of care discussions.   The patient is resting in bed, he is able to state his name, how ever, he is not able to carry on conversations, he appears weak and is resting in bed. His wife is at the bedside. I introduced myself to his [name], introduced palliative care as follows: Palliative medicine is specialized medical care for people living with serious illness. It focuses on providing relief from the symptoms and stress of a serious illness. The goal is to improve quality of life for both the patient and the family.  Brief life review performed. Patient's wife states that they have been together since 1950. They have one daughter Adam [name] who is currently in [place]. Patient and wife were living in independent living in [facility]. How ever, patient has been at a higher level of  care for the past few months.   We reviewed the patient's current condition and events in this hospitalization. Goals, wishes and values discussed. Patient has made a living will and HCPOA documents according to the wife.  See additional discussions and recommendations below.   NEXT OF KIN  [name] Daughter Adam [name].   SUMMARY OF RECOMMENDATIONS    full code/full scope of treatment Wife states that the patient has had 2 heart attacks, asking for cardiologist follow up.  Discussed with them about ID recommendations for TEE Gently introduced palliative philosophy of care.   Code Status/Advance Care Planning:  Full code    Symptom Management:    as above   Palliative Prophylaxis:   Delirium Protocol  Additional Recommendations (Limitations, Scope, Preferences):  Full Scope Treatment  Psycho-social/Spiritual:   Desire for further Chaplaincy support:yes  Additional Recommendations: Caregiving  Support/Resources  Prognosis:   Unable to determine  Discharge Planning: [place] for rehab with Palliative care service follow-up      Primary Diagnoses: Present on Admission: . Cardiomyopathy, ischemic . Combined congestive systolic and diastolic heart failure ([place]) . Coronary  artery disease involving native coronary artery of native heart without angina pectoris . Decubitus ulcer of sacral region, stage 2 . Expressive aphasia . GERD (gastroesophageal reflux disease) . Hyperlipidemia . Neurogenic bladder . Neurogenic orthostatic hypotension ([place]) . Hypothyroidism . Pacemaker . RLS (restless legs syndrome) . SSS (sick sinus syndrome) ([place]) . Urinary retention . Urinary tract infection   I have reviewed the medical record, interviewed the patient and family, and examined the patient. The following aspects are pertinent.  Past Medical History:  Diagnosis Date  . Arthritis    "minor, back and sometimes knees" (12/15/2012)  . Bradycardia     AFib/SSS s/p St Jude PPM 04/12/2008  . CAD (coronary artery disease) 12/30/2014   CABG (LIMA-LAD, SVG-RCA, SVG-OM in 1996).  07/2009 BMS to SVG-RCA. Cath in 04/2010 with patent stents   . Cardiomyopathy, ischemic 08/25/2012  . CHF (congestive heart failure) ([place])   . Chronic knee pain 12/03/2014  . Combined congestive systolic and diastolic heart failure ([place]) 02/02/2015   Hx EF 41%. BNP 96.8 02/21/15 Torsemide 04/06/15 Na 142, K 4.6, Bun 16, creat 0.89 04/20/14 BNP 111.7, Na 142, K 4.6, Bun 16, creat 0.9   . Depression with anxiety 02/02/2015   02/21/15 Hgb A1c 5.8 03/10/15 MMSE 30/30   . Dizziness, after diuretic asscoiated with hypotension and responded to fluid bolus 06/05/2011   04/28/15 US carotid R+L normal bilateral arterial velocities.    Marland Kitchen Dyspnea 08/11/2014   Followed in Pulmonary clinic/ [facility]/ [place]  - 08/11/2014  Walked RA x 1 laps @ 185 ft each stopped due to fatigue/off balance/ slow pace/  no sob or desat  - PFT's  09/26/2014  FEV1 2.26 (85 % ) ratio 76  p no % improvement from saba with DLCO  67 % corrects to 93 % for alv volume      Since prev study 08/04/13 minimal change lung vol or dlco    . Embolic cerebral infarction ([place]) 12/06/2015  . Exertional shortness of breath    "sometimes walking" (12/15/2012)  . GERD (gastroesophageal reflux disease)   . Gout 02/09/2015  . Heart murmur    "just told I had one today" (12/15/2012)  . Hiatal hernia   . Hyperlipidemia   . Hypertension   . Hypothyroid   . Influenza A 03/10/2016  . Insomnia   . Melanoma of back ([place]) 1976  . Myocardial infarction [facility]) 1996; 2011   "both silent" (12/15/2012)  . Nonrheumatic aortic valve stenosis   . Orthostatic hypotension   . Osteoporosis, senile   . Pacemaker   . RBBB   . Restless leg 02/02/2015  . Right leg weakness 12/06/2015  . [facility] spotted fever   . S/P CABG x 4   . Sick sinus syndrome ([place]) 01/31/2014  . Sustained ventricular tachycardia ([place]) 07/27/2014  . Urinary retention  12/30/2014   Social History   Socioeconomic History  . Marital status: Married    Spouse name: Not on file  . Number of children: 2  . Years of education: Masters  . Highest education level: Not on file  Occupational History  . Occupation: Retired Company secretary -Pensions consultant  Social Needs  . Financial resource strain: Not on file  . Food insecurity:    Worry: Not on file    Inability: Not on file  . Transportation needs:    Medical: Not on file    Non-medical: Not on file  Tobacco Use  . Smoking status: Never Smoker  . Smokeless tobacco:  Never Used  Substance and Sexual Activity  . Alcohol use: No    Alcohol/week: 0.0 oz  . Drug use: No  . Sexual activity: Never  Lifestyle  . Physical activity:    Days per week: Not on file    Minutes per session: Not on file  . Stress: Not on file  Relationships  . Social connections:    Talks on phone: Not on file    Gets together: Not on file    Attends religious service: Not on file    Active member of club or organization: Not on file    Attends meetings of clubs or organizations: Not on file    Relationship status: Not on file  Other Topics Concern  . Not on file  Social History Narrative   Lives at [facility] [place] 01/09/15   Married - Violet   Never smoked   Alcohol none   Previously employed as Scientist, forensic for [employer].         Diet:Low sodium   Do you drink/eat things with caffeine? No   Marital status: Married                              What year were you married?1950   Do you live in a house, apartment, assisted living, condo, trailer, etc)?    Is it one or more stories? 1   How many persons live in your home? 2   Do you have any pets in your home? No   Current or past profession: Minister, [employer]   Do you exercise?     Very Little                                                 Type & how often:    Do you have a living will?  Yes   Do you have a DNR Form? Yes   Do  you have a POA/HPOA forms? Yes   Family History  Problem Relation Age of Onset  . Coronary artery disease Mother   . Diabetes Mother   . Heart disease Mother   . Coronary artery disease Father   . Diabetes Father   . Lung cancer Father   . Heart disease Father   . Anuerysm Son   . Heart disease Brother    Scheduled Meds: . aspirin  300 mg Rectal Daily  . Chlorhexidine Gluconate Cloth  6 each Topical Q0600  . heparin  5,000 Units Subcutaneous Q8H  . levothyroxine  50 mcg Intravenous Daily  . methylPREDNISolone (SOLU-MEDROL) injection  40 mg Intravenous Daily   Continuous Infusions: . cefTRIAXone (ROCEPHIN)  IV Stopped (08/30/17 1450)  . dextrose 5 % and 0.45% NaCl 100 mL/hr at 08/29/17 1834   PRN Meds:.acetaminophen, bisacodyl, diazepam, guaiFENesin, HYDROcodone-acetaminophen, magic mouthwash w/lidocaine, morphine injection, ondansetron **OR** ondansetron (ZOFRAN) IV, phenol, rOPINIRole Medications Prior to Admission:  Prior to Admission medications   Medication Sig Start Date End Date Taking? Authorizing Provider  acetaminophen (TYLENOL) 500 MG tablet Take 1,000 mg by mouth 2 (two) times daily.   Yes [provider]  amiodarone (PACERONE) 100 MG tablet Take 1 tablet (100 mg total) by mouth daily. 01/16/17  Yes Croitoru, Mihai, MD  aspirin EC 81 MG tablet Take 1 tablet (81  mg total) by mouth daily. 01/12/16  Yes Reyne Dumas, MD  Cholecalciferol (VITAMIN D3) 5000 UNITS CAPS Take 5,000 Units by mouth every Monday.    Yes [provider]  clopidogrel (PLAVIX) 75 MG tablet TAKE 1 TABLET ONCE DAILY. 06/13/17  Yes Adam Serve, MD  Droxidopa 100 MG CAPS Take 100 mg by mouth 3 (three) times daily. 08/12/17  Yes Croitoru, Mihai, MD  levothyroxine (SYNTHROID, LEVOTHROID) 100 MCG tablet Take 100 mcg by mouth daily before breakfast.   Yes [provider]  Lidocaine 4 % PTCH Apply 1 patch topically daily. Left shoulder   Yes [provider]  Melatonin 3 MG  TABS Take 3 mg by mouth at bedtime.   Yes [provider]  midodrine (PROAMATINE) 10 MG tablet TAKE 1 TABLET THREE TIMES DAILY. TAKE LAST DOSE AT LEAST 6 HOURS BEFORE BEDTIME. 06/13/17  Yes Pandey, Mahima, MD  pantoprazole (PROTONIX) 40 MG tablet TAKE 1 TABLET BY MOUTH TWICE DAILY. 02/28/17  Yes Kilroy, Luke K, PA-C  polyethylene glycol (MIRALAX / GLYCOLAX) packet Take 17 g by mouth daily. Hold for loose stool   Yes [provider]  pravastatin (PRAVACHOL) 40 MG tablet Take 1 tablet (40 mg total) by mouth at bedtime. 10/21/16  Yes Adam Serve, MD  sennosides-docusate sodium (SENOKOT-S) 8.6-50 MG tablet Take 2 tablets by mouth at bedtime.    Yes [provider]  torsemide (DEMADEX) 10 MG tablet Take 5 mg by mouth daily.    Yes [provider]  traZODone (DESYREL) 50 MG tablet Take 1 tablet (50 mg total) by mouth at bedtime. 05/19/17  Yes Adam Serve, MD  acetaminophen (TYLENOL) 325 MG tablet Take 650 mg by mouth every 8 (eight) hours as needed for mild pain, moderate pain or headache.     [provider]  rOPINIRole (REQUIP) 0.5 MG tablet TAKE 1 TABLET 3 TIMES A DAY AS NEEDED. 07/08/17   Adam Serve, MD   Allergies  Allergen Reactions  . Altace [Ramipril] Cough  . Crestor [Rosuvastatin Calcium] Rash  . Penicillins Rash and Other (See Comments)    Has patient had a PCN reaction causing immediate rash, facial/tongue/throat swelling, SOB or lightheadedness with hypotension: Yes Has patient had a PCN reaction causing severe rash involving mucus membranes or skin necrosis: No Has patient had a PCN reaction that required hospitalization No Has patient had a PCN reaction occurring within the last 10 years: No If all of the above answers are "NO", then may proceed with Cephalosporin use.   . Sulfa Antibiotics Rash   Review of Systems Weak appearing gentleman  Physical Exam Patient is awake, is able to state his name How ever, he is not fully alert  to participate in discussions Regular No abd distension No edema S1 S2 Has PPM  Vital Signs: BP (!) 158/79 (BP Location: Left Arm)   Pulse 73   Temp 98.8 F (37.1 C) (Oral)   Resp 16   Ht 5\' 10"  (1.778 m)   Wt 78.6 kg (173 lb 4.5 oz)   SpO2 95%   BMI 24.86 kg/m  Pain Scale: 0-10 POSS *See Group Information*: S-Acceptable,Sleep, easy to arouse Pain Score: 3    SpO2: SpO2: 95 % O2 Device:SpO2: 95 % O2 Flow Rate: .   IO: Intake/output summary:   Intake/Output Summary (Last 24 hours) at 08/30/2017 1822 Last data filed at 08/30/2017 1058 Gross per 24 hour  Intake 198.33 ml  Output 950 ml  Net -751.67 ml  LBM: Last BM Date: 08/26/17 Baseline Weight: Weight: 78 kg (172 lb) Most recent weight: Weight: 78.6 kg (173 lb 4.5 oz)     Palliative Assessment/Data:   PPS 40%  Time In:  1500 Time Out:  1600 Time Total:  60 min  Greater than 50%  of this time was spent counseling and coordinating care related to the above assessment and plan.  Signed by: Loistine Chance, MD  [phone]  Please contact Palliative Medicine Team phone at [phone] for questions and concerns.  For individual provider: See Shea Evans

## 2017-08-30 NOTE — Consult Note (Addendum)
[facility]       Reason for Consult: bacteremia in setting of PPM    Referring Physician: Dr. Lonny Prude  Active Problems:   Cardiomyopathy, ischemic   Pacemaker   Hyperlipidemia   SSS (sick sinus syndrome) ([place])   Hypothyroidism   GERD (gastroesophageal reflux disease)   Urinary retention   Coronary artery disease involving native coronary artery of native heart without angina pectoris   Expressive aphasia   RLS (restless legs syndrome)   Combined congestive systolic and diastolic heart failure (HCC)   Urinary tract infection   Hx of CABG   Nonrheumatic aortic valve stenosis   History of arterial ischemic stroke   Weak   Decubitus ulcer of sacral region, stage 2   Neurogenic orthostatic hypotension (HCC)   Neurogenic bladder   Generalized weakness   Shoulder pain, bilateral   . aspirin  300 mg Rectal Daily  . Chlorhexidine Gluconate Cloth  6 each Topical Q0600  . heparin  5,000 Units Subcutaneous Q8H  . levothyroxine  50 mcg Intravenous Daily  . methylPREDNISolone (SOLU-MEDROL) injection  40 mg Intravenous Daily    Recommendations: Continue ceftriaxone TEE consult EP  I will repeat blood cultures  Assessment: He has bacteremia with Klebsiella oxytoca likely from a urinary source.  He also is complaining of pain in his left shoulder at the site of the PPM, though no warmth, erythema.     Dr. Johnnye Sima back on Monday  Antibiotics: ceftriaxone  HPI: Adam [name] is a 84 y.o. male with SSS s/p PPM placed in 2010, CAD s/p CABG, ICM, chronic indwelling foley came in with pain and weakness.  Blood cultures sent and positive as above.  Still with shoulder and mouth pain.  No associated n/v/.  No significant diarrhea.  Urine cultures also with Klebsiella and E coli additionally.  No fever or chills.     Review of Systems:  Constitutional: negative for fevers, chills and anorexia Respiratory: negative for cough Cardiovascular: negative for  dyspnea Gastrointestinal: positive for abdominal pain, negative for diarrhea Musculoskeletal: negative for myalgias and arthralgias All other systems reviewed and are negative    Past Medical History:  Diagnosis Date  . Arthritis    "minor, back and sometimes knees" (12/15/2012)  . Bradycardia    AFib/SSS s/p St Jude PPM 04/12/2008  . CAD (coronary artery disease) 12/30/2014   CABG (LIMA-LAD, SVG-RCA, SVG-OM in 1996).  07/2009 BMS to SVG-RCA. Cath in 04/2010 with patent stents   . Cardiomyopathy, ischemic 08/25/2012  . CHF (congestive heart failure) ([place])   . Chronic knee pain 12/03/2014  . Combined congestive systolic and diastolic heart failure ([place]) 02/02/2015   Hx EF 41%. BNP 96.8 02/21/15 Torsemide 04/06/15 Na 142, K 4.6, Bun 16, creat 0.89 04/20/14 BNP 111.7, Na 142, K 4.6, Bun 16, creat 0.9   . Depression with anxiety 02/02/2015   02/21/15 Hgb A1c 5.8 03/10/15 MMSE 30/30   . Dizziness, after diuretic asscoiated with hypotension and responded to fluid bolus 06/05/2011   04/28/15 US carotid R+L normal bilateral arterial velocities.    Marland Kitchen Dyspnea 08/11/2014   Followed in Pulmonary clinic/ [facility]/ [place]  - 08/11/2014  Walked RA x 1 laps @ 185 ft each stopped due to fatigue/off balance/ slow pace/  no sob or desat  - PFT's  09/26/2014  FEV1 2.26 (85 % ) ratio 76  p no % improvement from saba with DLCO  67 % corrects to 93 % for alv  volume      Since prev study 08/04/13 minimal change lung vol or dlco    . Embolic cerebral infarction ([place]) 12/06/2015  . Exertional shortness of breath    "sometimes walking" (12/15/2012)  . GERD (gastroesophageal reflux disease)   . Gout 02/09/2015  . Heart murmur    "just told I had one today" (12/15/2012)  . Hiatal hernia   . Hyperlipidemia   . Hypertension   . Hypothyroid   . Influenza A 03/10/2016  . Insomnia   . Melanoma of back ([place]) 1976  . Myocardial infarction [facility]) 1996; 2011   "both silent" (12/15/2012)  . Nonrheumatic aortic valve stenosis   .  Orthostatic hypotension   . Osteoporosis, senile   . Pacemaker   . RBBB   . Restless leg 02/02/2015  . Right leg weakness 12/06/2015  . [facility] spotted fever   . S/P CABG x 4   . Sick sinus syndrome ([place]) 01/31/2014  . Sustained ventricular tachycardia ([place]) 07/27/2014  . Urinary retention 12/30/2014    Social History   Tobacco Use  . Smoking status: Never Smoker  . Smokeless tobacco: Never Used  Substance Use Topics  . Alcohol use: No    Alcohol/week: 0.0 oz  . Drug use: No    Family History  Problem Relation Age of Onset  . Coronary artery disease Mother   . Diabetes Mother   . Heart disease Mother   . Coronary artery disease Father   . Diabetes Father   . Lung cancer Father   . Heart disease Father   . Anuerysm Son   . Heart disease Brother     Allergies  Allergen Reactions  . Altace [Ramipril] Cough  . Crestor [Rosuvastatin Calcium] Rash  . Penicillins Rash and Other (See Comments)    Has patient had a PCN reaction causing immediate rash, facial/tongue/throat swelling, SOB or lightheadedness with hypotension: Yes Has patient had a PCN reaction causing severe rash involving mucus membranes or skin necrosis: No Has patient had a PCN reaction that required hospitalization No Has patient had a PCN reaction occurring within the last 10 years: No If all of the above answers are "NO", then may proceed with Cephalosporin use.   . Sulfa Antibiotics Rash    Physical Exam: Constitutional: in no apparent distress and alert  Vitals:   08/29/17 2009 08/30/17 0453  BP: (!) 146/79 (!) 147/86  Pulse: 72 75  Resp: 20 20  Temp: 98.6 F (37 C) 97.6 F (36.4 C)  SpO2: 98% 95%   EYES: anicteric ENMT: no thrush Cardiovascular: Cor RRR Respiratory: CTA B; normal respiratory effort GI: Bowel sounds are normal, liver is not enlarged, spleen is not enlarged Musculoskeletal: no pedal edema noted Skin: negatives: no rash Hematologic: no cervical lad Chest: PPM site  with no tenderness, no warmth, no erythema  Lab Results  Component Value Date   WBC 11.5 (H) 08/29/2017   HGB 13.7 08/29/2017   HCT 40.5 08/29/2017   MCV 95.3 08/29/2017   PLT 322 08/29/2017    Lab Results  Component Value Date   CREATININE 0.55 (L) 08/29/2017   BUN 15 08/29/2017   NA 140 08/29/2017   K 3.8 08/29/2017   CL 106 08/29/2017   CO2 25 08/29/2017    Lab Results  Component Value Date   ALT 17 08/27/2017   AST 22 08/27/2017   ALKPHOS 59 08/27/2017     Microbiology: Recent Results (from the past 240 hour(s))  Urine Culture  Status: Abnormal   Collection Time: 08/25/17  2:29 PM  Result Value Ref Range Status   Specimen Description   Final    URINE, CLEAN CATCH Performed at [facility], [place] [address] [place], [place]  [address]    Special Requests   Final    NONE Performed at [facility], [place] [address] [place], [place] [address]    Culture (A)  Final    >=100,000 COLONIES/mL ESCHERICHIA COLI >=100,000 COLONIES/mL KLEBSIELLA OXYTOCA    Report Status 08/28/2017 FINAL  Final   Organism ID, Bacteria ESCHERICHIA COLI (A)  Final   Organism ID, Bacteria KLEBSIELLA OXYTOCA (A)  Final      Susceptibility   Escherichia coli - MIC*    AMPICILLIN <=2 SENSITIVE Sensitive     CEFAZOLIN <=4 SENSITIVE Sensitive     CEFTRIAXONE <=1 SENSITIVE Sensitive     CIPROFLOXACIN <=0.25 SENSITIVE Sensitive     GENTAMICIN <=1 SENSITIVE Sensitive     IMIPENEM <=0.25 SENSITIVE Sensitive     NITROFURANTOIN <=16 SENSITIVE Sensitive     TRIMETH/SULFA <=20 SENSITIVE Sensitive     AMPICILLIN/SULBACTAM <=2 SENSITIVE Sensitive     PIP/TAZO <=4 SENSITIVE Sensitive     Extended ESBL NEGATIVE Sensitive     * >=100,000 COLONIES/mL ESCHERICHIA COLI   Klebsiella oxytoca - MIC*    AMPICILLIN RESISTANT Resistant     CEFAZOLIN >=64 RESISTANT Resistant     CEFTRIAXONE <=1 SENSITIVE Sensitive     CIPROFLOXACIN <=0.25 SENSITIVE Sensitive      GENTAMICIN <=1 SENSITIVE Sensitive     IMIPENEM <=0.25 SENSITIVE Sensitive     NITROFURANTOIN 32 SENSITIVE Sensitive     TRIMETH/SULFA <=20 SENSITIVE Sensitive     AMPICILLIN/SULBACTAM 4 SENSITIVE Sensitive     PIP/TAZO <=4 SENSITIVE Sensitive     Extended ESBL NEGATIVE Sensitive     * >=100,000 COLONIES/mL KLEBSIELLA OXYTOCA  Blood culture (routine x 2)     Status: Abnormal (Preliminary result)   Collection Time: 08/25/17  2:47 PM  Result Value Ref Range Status   Specimen Description   Final    BLOOD LEFT ARM Performed at [place] [address], [place], [place] [address]    Special Requests   Final    BOTTLES DRAWN AEROBIC AND ANAEROBIC Blood Culture adequate volume Performed at [place] [address] [place], [place] [address]    Culture  Setup Time   Final    AEROBIC BOTTLE ONLY GRAM NEGATIVE RODS CRITICAL RESULT CALLED TO, READ BACK BY AND VERIFIED WITH: A PHAM PHARMD AT 0836 ON [id number] BY SJW    Culture (A)  Final    KLEBSIELLA OXYTOCA SUSCEPTIBILITIES TO FOLLOW Performed at [facility], [place] [address] [place], [place] [address]    Report Status PENDING  Incomplete  Blood Culture ID Panel (Reflexed)     Status: Abnormal   Collection Time: 08/25/17  2:47 PM  Result Value Ref Range Status   Enterococcus species NOT DETECTED NOT DETECTED Final   Listeria monocytogenes NOT DETECTED NOT DETECTED Final   Staphylococcus species NOT DETECTED NOT DETECTED Final   Staphylococcus aureus NOT DETECTED NOT DETECTED Final   Streptococcus species NOT DETECTED NOT DETECTED Final   Streptococcus agalactiae NOT DETECTED NOT DETECTED Final   Streptococcus pneumoniae NOT DETECTED NOT DETECTED Final   Streptococcus pyogenes NOT DETECTED NOT DETECTED Final   Acinetobacter baumannii NOT DETECTED NOT DETECTED Final   Enterobacteriaceae species DETECTED (A) NOT DETECTED  Final    Comment: Enterobacteriaceae represent a large family of  gram-negative bacteria, not a single organism. CRITICAL RESULT CALLED TO, READ BACK BY AND VERIFIED WITH: A PHAM PHARMD AT 0836 ON [id number] BY SJW    Enterobacter cloacae complex NOT DETECTED NOT DETECTED Final   Escherichia coli NOT DETECTED NOT DETECTED Final   Klebsiella oxytoca DETECTED (A) NOT DETECTED Final    Comment: CRITICAL RESULT CALLED TO, READ BACK BY AND VERIFIED WITH: A PHAM PHARMD AT 0836 ON [id number] BY SJW    Klebsiella pneumoniae NOT DETECTED NOT DETECTED Final   Proteus species NOT DETECTED NOT DETECTED Final   Serratia marcescens NOT DETECTED NOT DETECTED Final   Carbapenem resistance NOT DETECTED NOT DETECTED Final   Haemophilus influenzae NOT DETECTED NOT DETECTED Final   Neisseria meningitidis NOT DETECTED NOT DETECTED Final   Pseudomonas aeruginosa NOT DETECTED NOT DETECTED Final   Candida albicans NOT DETECTED NOT DETECTED Final   Candida glabrata NOT DETECTED NOT DETECTED Final   Candida krusei NOT DETECTED NOT DETECTED Final   Candida parapsilosis NOT DETECTED NOT DETECTED Final   Candida tropicalis NOT DETECTED NOT DETECTED Final    Comment: Performed at [facility], [place] [address] [place], [place] [address]  Blood culture (routine x 2)     Status: None   Collection Time: 08/25/17  2:52 PM  Result Value Ref Range Status   Specimen Description   Final    BLOOD RIGHT ARM Performed at [place] [address], [place], [place] [address]    Special Requests   Final    BOTTLES DRAWN AEROBIC AND ANAEROBIC Blood Culture adequate volume Performed at [facility] [address], [place], [place] [address]    Culture   Final    NO GROWTH 5 DAYS Performed at [facility], [place] [address] [place], [place] [address]    Report Status 08/30/2017 FINAL  Final  MRSA PCR Screening     Status: Abnormal   Collection Time: 08/25/17  6:18 PM  Result Value Ref Range Status   MRSA by PCR POSITIVE (A) NEGATIVE Final     Comment:        The GeneXpert MRSA Assay (FDA approved for NASAL specimens only), is one component of a comprehensive MRSA colonization surveillance program. It is not intended to diagnose MRSA infection nor to guide or monitor treatment for MRSA infections. RESULT CALLED TO, READ BACK BY AND VERIFIED WITHTod Persia RN [id number] 08/25/17 A NAVARRO Performed at [facility], [place] [address], [place], [place] [address]     Jarrett Albor W Akaisha Truman, MD [facility] [email or web address] [phone] pager  [phone] cell 08/30/2017, 1:46 PM

## 2017-08-31 LAB — CBC
HCT: 38.4 % — ABNORMAL LOW (ref 39.0–52.0)
Hemoglobin: 12.4 g/dL — ABNORMAL LOW (ref 13.0–17.0)
MCH: 31.3 pg (ref 26.0–34.0)
MCHC: 32.3 g/dL (ref 30.0–36.0)
MCV: 97 fL (ref 78.0–100.0)
Platelets: 339 10*3/uL (ref 150–400)
RBC: 3.96 MIL/uL — ABNORMAL LOW (ref 4.22–5.81)
RDW: 14 % (ref 11.5–15.5)
WBC: 8.1 10*3/uL (ref 4.0–10.5)

## 2017-08-31 LAB — CULTURE, BLOOD (ROUTINE X 2): Special Requests: ADEQUATE

## 2017-08-31 LAB — BASIC METABOLIC PANEL
Anion gap: 8 (ref 5–15)
BUN: 17 mg/dL (ref 8–23)
CO2: 25 mmol/L (ref 22–32)
Calcium: 8.3 mg/dL — ABNORMAL LOW (ref 8.9–10.3)
Chloride: 106 mmol/L (ref 98–111)
Creatinine, Ser: 0.51 mg/dL — ABNORMAL LOW (ref 0.61–1.24)
GFR calc Af Amer: >60 mL/min (ref >60)
GFR calc non Af Amer: >60 mL/min (ref >60)
Glucose, Bld: 120 mg/dL — ABNORMAL HIGH (ref 70–99)
Potassium: 3.7 mmol/L (ref 3.5–5.1)
Sodium: 139 mmol/L (ref 135–145)

## 2017-08-31 LAB — GLUCOSE, CAPILLARY: Glucose-Capillary: 94 mg/dL (ref 70–99)

## 2017-08-31 MED ORDER — METHOCARBAMOL 1000 MG/10ML IJ SOLN
500.0000 mg | Freq: Once | INTRAVENOUS | Status: AC
  Administered 2017-08-31: 500 mg via INTRAVENOUS
  Filled 2017-08-31: qty 550

## 2017-08-31 NOTE — Progress Notes (Signed)
PROGRESS NOTE    Adam [name]  IWL:[id number] DOB: May 26, 1926 DOA: 08/25/2017 PCP: Blanchie Serve, MD   Brief Narrative: Adam [name] is a 84 y.o. male with a history of CAD status post CABG, chronic combined CHF, depression, anxiety, status post PPM, CVA, GERD, chronic Foley catheter, restless leg syndrome, chronic hypertension.  Patient presented secondary to generalized body aches and weakness.  He is found to have a likely UTI.  He was started on ceftriaxone and cultures are significant for E. coli and Klebsiella oxytoca.  He has significant neck pain in addition to odynophagia.  Neck pain appears to be musculoskeletal most likely.  GI consulted for odynophagia, which also may be musculoskeletal related.   Assessment & Plan:   Active Problems:   Cardiomyopathy, ischemic   Pacemaker   Hyperlipidemia   SSS (sick sinus syndrome) (HCC)   Hypothyroidism   GERD (gastroesophageal reflux disease)   Urinary retention   Coronary artery disease involving native coronary artery of native heart without angina pectoris   Expressive aphasia   RLS (restless legs syndrome)   Combined congestive systolic and diastolic heart failure (HCC)   Urinary tract infection   Hx of CABG   Nonrheumatic aortic valve stenosis   History of arterial ischemic stroke   Weak   Decubitus ulcer of sacral region, stage 2   Neurogenic orthostatic hypotension (HCC)   Neurogenic bladder   Generalized weakness   Shoulder pain, bilateral   Weakness Fall Unknown etiology. Possibly related to UTI. Patient with degenerative neck disease which may also be playing a role. X-ray obtained of bilateral shoulders which was significant for mild arthritis. Per wife, she does not think his weakness is significantly far from baseline. -PT/OT eval  UTI Patient with a chronic indwelling catheter. Urine culture significant for E. Coli and klebsiella. Both are sensitive to ceftriaxone. -Continue ceftriaxone  Klebsiella  oxytoca bacteremia Secondary to UTI. -Antibiotics as above -Message sent to cardiology to schedule for TEE  Odynophagia Dysphagia New problem. ?musculoskeletal in nature, but symptoms are severe per patient and he is unable to swallow at all. GI without recommendations. Speech therapy recommending NPO secondary to failed MBS -Palliative care consulted. Appreciate recommendations -Possible need for temporary NG tube -Would like speech therapy to see today  Neck pain I think most of this is musculoskeletal. Question possible rheumatologic etiology. Seems to be improving with empiric steroid treatment -Valium prn -Norco and morphine prn -Continue Solu-medrol -Give one dose of Robaxin IV  Spondylolisthesis of cervical spine Discussed with neurosurgery, Dr. Ronnald Ramp, who recommend outpatient follow-up. -Soft neck collar for comfort  Hypothyroidism TSH normal. -Continue Synthroid  GERD -Continue Protonix  S/p pacemaker  Restless leg syndrome -Continue Requip  History of CVA Expressive aphasia -Continue aspirin and plavix  Orthostatic hypotension -Continue midodrine and droxidopa (unable to obtain inpatient) if able to take by mouth  Hyperlipidemia -Continue pravastatin. Doubt this is contributing. Held while NPO  Chronic combined systolic and diastolic heart failure History of CAD s/p CABG No chest pain. -Continue aspirin rectal and plavix (held while NPO)  Moisture associated skin damage Present on gluteal fold and right buttock crease. Present on admission. Wound care.  Tremor One time episode as an outpatient. No recurrence. In setting of UTI and bacteremia. Discussed with neurology. If recurs, will formally consult.   DVT prophylaxis: Heparin subq Code Status:   Code Status: Full Code Family Communication: Wife and daughter at bedside Disposition Plan: Discharge pending improvement of pain   Consultants:  Neurosurgery  (telephone)  Gastroenterology  Palliative care medicine  Procedures:   None  Antimicrobials:  Ceftriaxone    Subjective: Pain continues to improve  Objective: Vitals:   08/30/17 1409 08/30/17 1956 08/31/17 0405 08/31/17 0500  BP: (!) 158/79 (!) 152/74 134/81   Pulse: 73 71 70   Resp: 16 18 20    Temp: 98.8 F (37.1 C) 98.2 F (36.8 C) 97.8 F (36.6 C)   TempSrc: Oral Oral Oral   SpO2: 95% 95% 96%   Weight:    75.2 kg (165 lb 12.8 oz)  Height:        Intake/Output Summary (Last 24 hours) at 08/31/2017 1222 Last data filed at 08/31/2017 0655 Gross per 24 hour  Intake 3800 ml  Output 1600 ml  Net 2200 ml   Filed Weights   08/28/17 0613 08/30/17 0500 08/31/17 0500  Weight: 78.8 kg (173 lb 11.6 oz) 78.6 kg (173 lb 4.5 oz) 75.2 kg (165 lb 12.8 oz)    Examination:  General exam: Appears calm and comfortable Respiratory system: Clear to auscultation. Respiratory effort normal. Cardiovascular system: S1 & S2 heard, RRR. No murmurs. Gastrointestinal system: Abdomen is nondistended, soft and nontender. No organomegaly or masses felt. Normal bowel sounds heard. Central nervous system: Alert and oriented. No focal neurological deficits. Musculoskeletal: Mild trapezius tenderness with tenderness along SCM bilaterally Skin: No cyanosis. No rashes Psychiatry: Judgement and insight appear normal. Mood & affect appropriate.    Data Reviewed: I have personally reviewed following labs and imaging studies  CBC: Recent Labs  Lab 08/25/17 1346 08/27/17 0423 08/29/17 1022 08/31/17 0342  WBC 7.0 8.9 11.5* 8.1  NEUTROABS  --  6.5  --   --   HGB 13.3 12.1* 13.7 12.4*  HCT 40.3 36.9* 40.5 38.4*  MCV 97.6 97.1 95.3 97.0  PLT 206 243 322 175   Basic Metabolic Panel: Recent Labs  Lab 08/25/17 1346 08/26/17 0426 08/27/17 0423 08/29/17 1022 08/31/17 0342  NA 140 139 142 140 139  K 3.9 4.2 3.8 3.8 3.7  CL 106 107 108 106 106  CO2 25 25 26 25 25   GLUCOSE 108* 104* 109*  113* 120*  BUN 13 13 18 15 17   CREATININE 0.56* 0.49* 0.57* 0.55* 0.51*  CALCIUM 8.6* 8.6* 8.5* 8.8* 8.3*  MG  --  2.2 2.1  --   --   PHOS  --  3.4  --   --   --    GFR: Estimated Creatinine Clearance: 65.9 mL/min (A) (by C-G formula based on SCr of 0.51 mg/dL (L)). Liver Function Tests: Recent Labs  Lab 08/25/17 1346 08/26/17 0426 08/27/17 0423  AST 23 24 22   ALT 19 15 17   ALKPHOS 65 62 59  BILITOT 0.5 1.1 0.6  PROT 6.8 6.3* 6.2*  ALBUMIN 3.1* 2.7* 2.5*   No results for input(s): LIPASE, AMYLASE in the last 168 hours. No results for input(s): AMMONIA in the last 168 hours. Coagulation Profile: No results for input(s): INR, PROTIME in the last 168 hours. Cardiac Enzymes: Recent Labs  Lab 08/26/17 1439 08/26/17 2136 08/27/17 0423  CKTOTAL 102  --   --   TROPONINI 0.03* 0.03* 0.03*   BNP (last 3 results) Recent Labs    09/17/16 1517  PROBNP 721.0*   HbA1C: No results for input(s): HGBA1C in the last 72 hours. CBG: Recent Labs  Lab 08/27/17 0737 08/28/17 0808 08/29/17 0728 08/30/17 0740 08/31/17 0747  GLUCAP 116* 110* 124* 112* 94   Lipid Profile:  No results for input(s): CHOL, HDL, LDLCALC, TRIG, CHOLHDL, LDLDIRECT in the last 72 hours. Thyroid Function Tests: No results for input(s): TSH, T4TOTAL, FREET4, T3FREE, THYROIDAB in the last 72 hours. Anemia Panel: No results for input(s): VITAMINB12, FOLATE, FERRITIN, TIBC, IRON, RETICCTPCT in the last 72 hours. Sepsis Labs: Recent Labs  Lab 08/27/17 0423  LATICACIDVEN 0.9    Recent Results (from the past 240 hour(s))  Urine Culture     Status: Abnormal   Collection Time: 08/25/17  2:29 PM  Result Value Ref Range Status   Specimen Description   Final    URINE, CLEAN CATCH Performed at [facility], [place] [address], [place], [place] [address]    Special Requests   Final    NONE Performed at [facility], [place] [address], [place], [place] [address]    Culture (A)   Final    >=100,000 COLONIES/mL ESCHERICHIA COLI >=100,000 COLONIES/mL KLEBSIELLA OXYTOCA    Report Status 08/28/2017 FINAL  Final   Organism ID, Bacteria ESCHERICHIA COLI (A)  Final   Organism ID, Bacteria KLEBSIELLA OXYTOCA (A)  Final      Susceptibility   Escherichia coli - MIC*    AMPICILLIN <=2 SENSITIVE Sensitive     CEFAZOLIN <=4 SENSITIVE Sensitive     CEFTRIAXONE <=1 SENSITIVE Sensitive     CIPROFLOXACIN <=0.25 SENSITIVE Sensitive     GENTAMICIN <=1 SENSITIVE Sensitive     IMIPENEM <=0.25 SENSITIVE Sensitive     NITROFURANTOIN <=16 SENSITIVE Sensitive     TRIMETH/SULFA <=20 SENSITIVE Sensitive     AMPICILLIN/SULBACTAM <=2 SENSITIVE Sensitive     PIP/TAZO <=4 SENSITIVE Sensitive     Extended ESBL NEGATIVE Sensitive     * >=100,000 COLONIES/mL ESCHERICHIA COLI   Klebsiella oxytoca - MIC*    AMPICILLIN RESISTANT Resistant     CEFAZOLIN >=64 RESISTANT Resistant     CEFTRIAXONE <=1 SENSITIVE Sensitive     CIPROFLOXACIN <=0.25 SENSITIVE Sensitive     GENTAMICIN <=1 SENSITIVE Sensitive     IMIPENEM <=0.25 SENSITIVE Sensitive     NITROFURANTOIN 32 SENSITIVE Sensitive     TRIMETH/SULFA <=20 SENSITIVE Sensitive     AMPICILLIN/SULBACTAM 4 SENSITIVE Sensitive     PIP/TAZO <=4 SENSITIVE Sensitive     Extended ESBL NEGATIVE Sensitive     * >=100,000 COLONIES/mL KLEBSIELLA OXYTOCA  Blood culture (routine x 2)     Status: Abnormal   Collection Time: 08/25/17  2:47 PM  Result Value Ref Range Status   Specimen Description   Final    BLOOD LEFT ARM Performed at [facility] [address] [place], [place] [address]    Special Requests   Final    BOTTLES DRAWN AEROBIC AND ANAEROBIC Blood Culture adequate volume Performed at [facility] [address] [place], [place] [address]    Culture  Setup Time   Final    AEROBIC BOTTLE ONLY GRAM NEGATIVE RODS CRITICAL RESULT CALLED TO, READ BACK BY AND VERIFIED WITH: A PHAM PHARMD AT 0836 ON [id number]  BY SJW Performed at [facility], [place] [address] [place], [place] [address]    Culture KLEBSIELLA OXYTOCA (A)  Final   Report Status 08/31/2017 FINAL  Final   Organism ID, Bacteria KLEBSIELLA OXYTOCA  Final      Susceptibility   Klebsiella oxytoca - MIC*    AMPICILLIN RESISTANT Resistant     CEFAZOLIN >=64 RESISTANT Resistant     CEFEPIME <=1 SENSITIVE Sensitive  CEFTAZIDIME <=1 SENSITIVE Sensitive     CEFTRIAXONE <=1 SENSITIVE Sensitive     CIPROFLOXACIN <=0.25 SENSITIVE Sensitive     GENTAMICIN <=1 SENSITIVE Sensitive     IMIPENEM <=0.25 SENSITIVE Sensitive     TRIMETH/SULFA <=20 SENSITIVE Sensitive     AMPICILLIN/SULBACTAM 8 SENSITIVE Sensitive     PIP/TAZO <=4 SENSITIVE Sensitive     * KLEBSIELLA OXYTOCA  Blood Culture ID Panel (Reflexed)     Status: Abnormal   Collection Time: 08/25/17  2:47 PM  Result Value Ref Range Status   Enterococcus species NOT DETECTED NOT DETECTED Final   Listeria monocytogenes NOT DETECTED NOT DETECTED Final   Staphylococcus species NOT DETECTED NOT DETECTED Final   Staphylococcus aureus NOT DETECTED NOT DETECTED Final   Streptococcus species NOT DETECTED NOT DETECTED Final   Streptococcus agalactiae NOT DETECTED NOT DETECTED Final   Streptococcus pneumoniae NOT DETECTED NOT DETECTED Final   Streptococcus pyogenes NOT DETECTED NOT DETECTED Final   Acinetobacter baumannii NOT DETECTED NOT DETECTED Final   Enterobacteriaceae species DETECTED (A) NOT DETECTED Final    Comment: Enterobacteriaceae represent a large family of gram-negative bacteria, not a single organism. CRITICAL RESULT CALLED TO, READ BACK BY AND VERIFIED WITH: A PHAM PHARMD AT 0836 ON [id number] BY SJW    Enterobacter cloacae complex NOT DETECTED NOT DETECTED Final   Escherichia coli NOT DETECTED NOT DETECTED Final   Klebsiella oxytoca DETECTED (A) NOT DETECTED Final    Comment: CRITICAL RESULT CALLED TO, READ BACK BY AND VERIFIED WITH: A PHAM PHARMD AT 0836 ON [id number] BY  SJW    Klebsiella pneumoniae NOT DETECTED NOT DETECTED Final   Proteus species NOT DETECTED NOT DETECTED Final   Serratia marcescens NOT DETECTED NOT DETECTED Final   Carbapenem resistance NOT DETECTED NOT DETECTED Final   Haemophilus influenzae NOT DETECTED NOT DETECTED Final   Neisseria meningitidis NOT DETECTED NOT DETECTED Final   Pseudomonas aeruginosa NOT DETECTED NOT DETECTED Final   Candida albicans NOT DETECTED NOT DETECTED Final   Candida glabrata NOT DETECTED NOT DETECTED Final   Candida krusei NOT DETECTED NOT DETECTED Final   Candida parapsilosis NOT DETECTED NOT DETECTED Final   Candida tropicalis NOT DETECTED NOT DETECTED Final    Comment: Performed at [facility], [place] [address], [place], [place] [address]  Blood culture (routine x 2)     Status: None   Collection Time: 08/25/17  2:52 PM  Result Value Ref Range Status   Specimen Description   Final    BLOOD RIGHT ARM Performed at [facility] [address], [place], [place] [address]    Special Requests   Final    BOTTLES DRAWN AEROBIC AND ANAEROBIC Blood Culture adequate volume Performed at [facility] [address] [place],  [address]    Culture   Final    NO GROWTH 5 DAYS Performed at [facility], [place] [address] [place],  [address]    Report Status 08/30/2017 FINAL  Final  MRSA PCR Screening     Status: Abnormal   Collection Time: 08/25/17  6:18 PM  Result Value Ref Range Status   MRSA by PCR POSITIVE (A) NEGATIVE Final    Comment:        The GeneXpert MRSA Assay (FDA approved for NASAL specimens only), is one component of a comprehensive MRSA colonization surveillance program. It is not intended to diagnose MRSA infection nor to guide or monitor treatment for MRSA infections. RESULT  CALLED TO, READ BACK BY AND VERIFIED WITHTod Persia RN 2119 08/25/17 A NAVARRO Performed at [facility], [place] [address]  [place], [place] [address]          Radiology Studies: Ct Soft Tissue Neck W Contrast  Result Date: 08/29/2017 CLINICAL DATA:  Neck pain and BILATERAL shoulder pain. Difficulty swallowing. EXAM: CT NECK WITH CONTRAST TECHNIQUE: Multidetector CT imaging of the neck was performed using the standard protocol following the bolus administration of intravenous contrast. CONTRAST:  43mL OMNIPAQUE IOHEXOL 300 MG/ML  SOLN COMPARISON:  None. FINDINGS: Pharynx and larynx: Normal. No mass or swelling. Salivary glands: No inflammation, mass, or stone. Thyroid: Normal. Lymph nodes: None enlarged or abnormal density. Vascular: Atherosclerosis.  Vessel patency is established. Limited intracranial: Generalized atrophy. Visualized orbits: Barely visualized, no gross abnormality. Mastoids and visualized paranasal sinuses: No layering sinus or mastoid fluid. Skeleton: Spondylosis.  No worrisome osseous lesion. Upper chest: No pneumothorax. Mild vascular congestion. No visible lung mass. Pacemaker, LEFT subclavian approach. Prior median sternotomy for CABG. Other: Healed IMPRESSION: No pharyngeal or neck inflammatory process is evident. Airway appears patent. No esophageal air-fluid level or significant neck mass or adenopathy is evident. Electronically Signed   By: Staci Righter M.D.   On: 08/29/2017 13:07        Scheduled Meds: . aspirin  300 mg Rectal Daily  . heparin  5,000 Units Subcutaneous Q8H  . levothyroxine  50 mcg Intravenous Daily  . methylPREDNISolone (SOLU-MEDROL) injection  40 mg Intravenous Daily   Continuous Infusions: . cefTRIAXone (ROCEPHIN)  IV Stopped (08/30/17 1450)  . dextrose 5 % and 0.45% NaCl 100 mL/hr at 08/31/17 0631  . methocarbamol (ROBAXIN) IV       LOS: 6 days     Cordelia Poche, MD [facility] 08/31/2017, 12:22 PM Pager: [phone]  If 7PM-7AM, please contact night-coverage [email or web address] 08/31/2017, 12:22 PM

## 2017-09-01 ENCOUNTER — Encounter (HOSPITAL_COMMUNITY): Payer: Self-pay | Admitting: Certified Registered Nurse Anesthetist

## 2017-09-01 ENCOUNTER — Inpatient Hospital Stay (HOSPITAL_COMMUNITY): Payer: Medicare Other

## 2017-09-01 ENCOUNTER — Encounter (HOSPITAL_COMMUNITY)
Admission: EM | Disposition: A | Discharge status: Skilled Nursing Facility | Payer: Self-pay | Source: Skilled Nursing Facility | Attending: Family Medicine

## 2017-09-01 DIAGNOSIS — N390 Urinary tract infection, site not specified: Secondary | ICD-10-CM

## 2017-09-01 DIAGNOSIS — R7881 Bacteremia: Secondary | ICD-10-CM

## 2017-09-01 DIAGNOSIS — B9620 Unspecified Escherichia coli [E. coli] as the cause of diseases classified elsewhere: Secondary | ICD-10-CM

## 2017-09-01 LAB — GLUCOSE, CAPILLARY: Glucose-Capillary: 104 mg/dL — ABNORMAL HIGH (ref 70–99)

## 2017-09-01 SURGERY — CANCELLED PROCEDURE

## 2017-09-01 MED ORDER — MECLIZINE HCL 25 MG PO TABS
25.0000 mg | ORAL_TABLET | Freq: Two times a day (BID) | ORAL | Status: DC | PRN
  Administered 2017-09-01: 25 mg via ORAL
  Filled 2017-09-01: qty 1

## 2017-09-01 NOTE — Progress Notes (Addendum)
  Speech Language Pathology Treatment: Dysphagia  Patient Details Name: WYATTE DAMES MRN: [id number] DOB: [birth year] Today's Date: 09/01/2017 Time: 9093-1121 SLP Time Calculation (min) (ACUTE ONLY): 40 min  Assessment / Plan / Recommendation Clinical Impression  Pt demonstrating improved secretion management today.  However with intake his voice becomes hoarse/ gurgly ? Concerning for laryngeal penetration without overt awareness.  In addition, he admits that he has had moe problems with hoarseness off and on for the last two weeks.  Sensitive to cold noted with improved tolerance of warm intake.  SLP will proceed with MBS given pt on and off again dysphagia and full code status with age of 84.  Advised family/pt to be considering plan in future if his dysphagia becomes severe enough to prohibit him from intake - adequate with and without aspirating.   MBS 3 pm today planned.    HPI HPI: 84 year old male admitted 08/25/17 with pain and weakness, UTI. PMH: CAD/CABG, CHF, depression/anxiety, bradycardia, sick sinus syndrome, pacer, CVA, GERD, gout, heart murmer, [place] Spotted Fever, Melanoma, HTN, RLS, urinary retention, MI x2      SLP Plan   MBS today 1500, ice chips and single tsps warm water ok        Recommendations  Diet recommendations: Other(comment);NPO                        GO                Macario Golds 09/01/2017, 1:23 PM

## 2017-09-01 NOTE — Progress Notes (Signed)
Physical Therapy Treatment Patient Details Name: Adam [name] MRN: [id number] DOB: January 21, 1927 Today's Date: 09/01/2017    History of Present Illness 84yo male with chief complaints of weakness and genearlized pain. Diagnosed with acute UTI; shoulders negative for fracture B, however does have cervical spondylolisthesis and neurosurgical consult is pending. PMH OA, pacemaker, CABG, CHF, depression/anxiety, hx dyspnea, gout, silent MI, aortical valve stenosis, orthostatic hypotension, RLS, [place] spotted fever, sick sinus syndrome, hx CVA with expressive aphasia     PT Comments    Pt in bed with spouse and son in law in room.  Pt reluctant to get OOB feeling very weak and fearful of falling.  Pt required + 2 assist but was able to briefly stand and take a few steps to recliner.    Follow Up Recommendations  SNF     Equipment Recommendations  None recommended by PT    Recommendations for Other Services       Precautions / Restrictions Precautions Precautions: Cervical;ICD/Pacemaker;Fall Precaution Comments: cervical spondyoloisthesis- has a soft collar/applied for OOB activity but removed when back to bed Restrictions Weight Bearing Restrictions: No    Mobility  Bed Mobility Overal bed mobility: Needs Assistance Bed Mobility: Supine to Sit;Sit to Supine     Supine to sit: Max assist;+2 for physical assistance;+2 for safety/equipment Sit to supine: Total assist;+2 for physical assistance;+2 for safety/equipment   General bed mobility comments: elevated HOB and used bed pad to complete scooting    allowed increased time sitting EOB due to dizziness  Transfers                 General transfer comment: pt fearful and some anxiety.   2 side by side assist to partially stand (flex hips and knees)    pt able to take a few side steps to recliner with walker and + 2 assist.   Sat in recliner x 10 min whaile MD in room.  Then assisted back to bed same fashion.     Ambulation/Gait Ambulation/Gait assistance: Max assist;+2 physical assistance;+2 safety/equipment Gait Distance (Feet): 1 Feet Assistive device: Rolling walker (2 wheeled) Gait Pattern/deviations: Step-to pattern Gait velocity: decreased    General Gait Details: just a few side steps to recliner and again to bed.  Poor posture B hips/knees flex   Stairs             Wheelchair Mobility    Modified Rankin (Stroke Patients Only)       Balance                                            Cognition Arousal/Alertness: Awake/alert Behavior During Therapy: Anxious Overall Cognitive Status: Within Functional Limits for tasks assessed                                 General Comments: cognition improved            Exercises      General Comments        Pertinent Vitals/Pain Pain Assessment: No/denies pain Pain Score: 2  Pain Location: pt admits to pain in neck/throat with and without po intake Pain Intervention(s): Monitored during session    Home Living  Prior Function            PT Goals (current goals can now be found in the care plan section) Progress towards PT goals: Progressing toward goals    Frequency    Min 2X/week      PT Plan Current plan remains appropriate    Co-evaluation              AM-PAC PT "6 Clicks" Daily Activity  Outcome Measure  Difficulty turning over in bed (including adjusting bedclothes, sheets and blankets)?: A Lot Difficulty moving from lying on back to sitting on the side of the bed? : A Lot Difficulty sitting down on and standing up from a chair with arms (e.g., wheelchair, bedside commode, etc,.)?: A Lot Help needed moving to and from a bed to chair (including a wheelchair)?: A Lot Help needed walking in hospital room?: Total Help needed climbing 3-5 steps with a railing? : Total 6 Click Score: 10    End of Session Equipment Utilized During  Treatment: Gait belt Activity Tolerance: Patient limited by fatigue Patient left: in bed;with family/visitor present;with nursing/sitter in room Nurse Communication: Mobility status PT Visit Diagnosis: Unsteadiness on feet (R26.81);Muscle weakness (generalized) (M62.81);History of falling (Z91.81);Difficulty in walking, not elsewhere classified (R26.2)     Time: 5625-6389 PT Time Calculation (min) (ACUTE ONLY): 25 min  Charges:  $Therapeutic Activity: 23-37 mins                     Rica Koyanagi  PTA [facility]  Acute  Rehab Pager      [phone]

## 2017-09-01 NOTE — Progress Notes (Signed)
MBS completed, full report to follow.  Recommend dys3/ground meat/thin with strict precautions.  No aspiration/penetration of any consistency tested noted.  Weakness continues with mild residuals with intermittent sensation.  Pt able to clear residuals with cued dry swallow.  Of note, pt throat clearing during MBS without barium in larynx, he does appear with secretions retained in pharynx.  Will follow up for dysphagia management and RMST to address dysphagia/cough strength.  Family present and pt, and family educated to findings/recommendations during [place].  Adam [name], [place] SLP [phone]

## 2017-09-01 NOTE — Consult Note (Signed)
Cardiology Consultation:   Patient ID: Adam [name]; [id number]; 1926-07-07   Admit date: 08/25/2017 Date of Consult: 09/01/2017  Primary Care Provider: Blanchie Serve, MD Primary Cardiologist: Dr. Sallyanne [name]    Patient Profile:   Adam [name] is a 84 y.o. male with a hx of CAD Adam [name] note makes mention of (previous bypass surgery with an extensive scar from inferior wall myocardial infarction), history of sustained symptomatic ventricular tachycardia (probably "scar VT") on amiodarone, planned for conservative management approach given advanced age, and functional decline. Further medical history notable for prior ischemic CVA, no noted hx of AF, sinus node dysfunction w/PPM known to be atrialy dependent, orthostatic hypotension/symptoms despite high dose ProAmatine as well as droxidopa, progressive dementia, chronic CHF (systolic), HTN, HLD, hypothyroidism, RLS, urinary retention w/chronic indwelling foley with hx of UTI's who is being seen today for the evaluation of PPM /bacteremia at the request of Dr. Linus [name].  History of Present Illness:   Adam [name] was admitted to [facility] 7/22 with c/o generalized weakness and body aches/pains, declining appetite (H&P noted his last meal was 3 days prior to the admission) with inability to swallow, neck pain/ and b/lshoulder pain, and insomnia.  He was noted to have recurrent Klebsiella UTI, BC drawn and Ceftriaxone started, given IVF, also noted to have sacral decub.  ID was consulted recommended TEE and EP consultation 2/2 PPM in setting of bacteremia. Unable to pursue TEE with severe dysphagia and reports of pt unable to tilt head back 2/2 neck pain. The patient has improved since admit and note repeat blood cultures are negative.   Past Medical History:  Diagnosis Date  . Arthritis    "minor, back and sometimes knees" (12/15/2012)  . Bradycardia    AFib/SSS s/p St Jude PPM 04/12/2008  . CAD (coronary artery disease) 12/30/2014   CABG  (LIMA-LAD, SVG-RCA, SVG-OM in 1996).  07/2009 BMS to SVG-RCA. Cath in 04/2010 with patent stents   . Cardiomyopathy, ischemic 08/25/2012  . CHF (congestive heart failure) ([place])   . Chronic knee pain 12/03/2014  . Combined congestive systolic and diastolic heart failure ([place]) 02/02/2015   Hx EF 41%. BNP 96.8 02/21/15 Torsemide 04/06/15 Na 142, K 4.6, Bun 16, creat 0.89 04/20/14 BNP 111.7, Na 142, K 4.6, Bun 16, creat 0.9   . Depression with anxiety 02/02/2015   02/21/15 Hgb A1c 5.8 03/10/15 MMSE 30/30   . Dizziness, after diuretic asscoiated with hypotension and responded to fluid bolus 06/05/2011   04/28/15 US carotid R+L normal bilateral arterial velocities.    Marland Kitchen Dyspnea 08/11/2014   Followed in Pulmonary clinic/ [facility]/ [place]  - 08/11/2014  Walked RA x 1 laps @ 185 ft each stopped due to fatigue/off balance/ slow pace/  no sob or desat  - PFT's  09/26/2014  FEV1 2.26 (85 % ) ratio 76  p no % improvement from saba with DLCO  67 % corrects to 93 % for alv volume      Since prev study 08/04/13 minimal change lung vol or dlco    . Embolic cerebral infarction ([place]) 12/06/2015  . Exertional shortness of breath    "sometimes walking" (12/15/2012)  . GERD (gastroesophageal reflux disease)   . Gout 02/09/2015  . Heart murmur    "just told I had one today" (12/15/2012)  . Hiatal hernia   . Hyperlipidemia   . Hypertension   . Hypothyroid   . Influenza A 03/10/2016  . Insomnia   . Melanoma of back ([place]) 1976  .  Myocardial infarction [facility]) 1996; 2011   "both silent" (12/15/2012)  . Nonrheumatic aortic valve stenosis   . Orthostatic hypotension   . Osteoporosis, senile   . Pacemaker   . RBBB   . Restless leg 02/02/2015  . Right leg weakness 12/06/2015  . [facility] spotted fever   . S/P CABG x 4   . Sick sinus syndrome ([place]) 01/31/2014  . Sustained ventricular tachycardia ([place]) 07/27/2014  . Urinary retention 12/30/2014    Past Surgical History:  Procedure Laterality Date  . CARDIAC  CATHETERIZATION  04/2010   LIMA to LAD patent,SVG to OM patent,no in-stnet restenosis RCA  . CATARACT EXTRACTION W/ INTRAOCULAR LENS  IMPLANT, BILATERAL Bilateral 2012  . CORONARY ANGIOPLASTY WITH STENT PLACEMENT  07/2009   bare metal stent to SVG to the RCA  . CORONARY ARTERY BYPASS GRAFT  1996   LIMA to LAD,SVG to RCA & SVG to OM  . INSERT / REPLACE / REMOVE PACEMAKER  2010  . MELANOMA EXCISION  05/1974 X2   "taken off my back" (12/15/2012)  . NM MYOVIEW LTD  06/2011   low risk  . PPM GENERATOR CHANGEOUT N/A 01/22/2017   Procedure: PPM GENERATOR CHANGEOUT;  Surgeon: Adam [name], MD;  Location: [place] CV LAB;  Service: Cardiovascular;  Laterality: N/A;  . TONSILLECTOMY  1938  . TRANSURETHRAL RESECTION OF PROSTATE  1986  . US ECHOCARDIOGRAPHY  07/11/2009   EF 45-50%     Home Medications:  Prior to Admission medications   Medication Sig Start Date End Date Taking? Authorizing Provider  acetaminophen (TYLENOL) 500 MG tablet Take 1,000 mg by mouth 2 (two) times daily.   Yes [provider]  amiodarone (PACERONE) 100 MG tablet Take 1 tablet (100 mg total) by mouth daily. 01/16/17  Yes Croitoru, Mihai, MD  aspirin EC 81 MG tablet Take 1 tablet (81 mg total) by mouth daily. 01/12/16  Yes Adam Dumas, MD  Cholecalciferol (VITAMIN D3) 5000 UNITS CAPS Take 5,000 Units by mouth every Monday.    Yes [provider]  clopidogrel (PLAVIX) 75 MG tablet TAKE 1 TABLET ONCE DAILY. 06/13/17  Yes Adam Serve, MD  Droxidopa 100 MG CAPS Take 100 mg by mouth 3 (three) times daily. 08/12/17  Yes Croitoru, Mihai, MD  levothyroxine (SYNTHROID, LEVOTHROID) 100 MCG tablet Take 100 mcg by mouth daily before breakfast.   Yes [provider]  Lidocaine 4 % PTCH Apply 1 patch topically daily. Left shoulder   Yes [provider]  Melatonin 3 MG TABS Take 3 mg by mouth at bedtime.   Yes [provider]  midodrine (PROAMATINE) 10 MG tablet TAKE 1 TABLET THREE TIMES  DAILY. TAKE LAST DOSE AT LEAST 6 HOURS BEFORE BEDTIME. 06/13/17  Yes Pandey, Mahima, MD  pantoprazole (PROTONIX) 40 MG tablet TAKE 1 TABLET BY MOUTH TWICE DAILY. 02/28/17  Yes Kilroy, Luke K, PA-C  polyethylene glycol (MIRALAX / GLYCOLAX) packet Take 17 g by mouth daily. Hold for loose stool   Yes [provider]  pravastatin (PRAVACHOL) 40 MG tablet Take 1 tablet (40 mg total) by mouth at bedtime. 10/21/16  Yes Adam Serve, MD  sennosides-docusate sodium (SENOKOT-S) 8.6-50 MG tablet Take 2 tablets by mouth at bedtime.    Yes [provider]  torsemide (DEMADEX) 10 MG tablet Take 5 mg by mouth daily.    Yes [provider]  traZODone (DESYREL) 50 MG tablet Take 1 tablet (50 mg total) by mouth at bedtime. 05/19/17  Yes Pandey, Mahima,  MD  acetaminophen (TYLENOL) 325 MG tablet Take 650 mg by mouth every 8 (eight) hours as needed for mild pain, moderate pain or headache.     [provider]  rOPINIRole (REQUIP) 0.5 MG tablet TAKE 1 TABLET 3 TIMES A DAY AS NEEDED. 07/08/17   Adam Serve, MD    Inpatient Medications: Scheduled Meds: . aspirin  300 mg Rectal Daily  . heparin  5,000 Units Subcutaneous Q8H  . levothyroxine  50 mcg Intravenous Daily  . methylPREDNISolone (SOLU-MEDROL) injection  40 mg Intravenous Daily   Continuous Infusions: . cefTRIAXone (ROCEPHIN)  IV Stopped (08/31/17 1406)  . dextrose 5 % and 0.45% NaCl 100 mL/hr at 09/01/17 0509   PRN Meds: acetaminophen, bisacodyl, diazepam, guaiFENesin, HYDROcodone-acetaminophen, magic mouthwash w/lidocaine, morphine injection, ondansetron **OR** ondansetron (ZOFRAN) IV, phenol, rOPINIRole  Allergies:    Allergies  Allergen Reactions  . Altace [Ramipril] Cough  . Crestor [Rosuvastatin Calcium] Rash  . Penicillins Rash and Other (See Comments)    Has patient had a PCN reaction causing immediate rash, facial/tongue/throat swelling, SOB or lightheadedness with hypotension: Yes Has patient had a PCN  reaction causing severe rash involving mucus membranes or skin necrosis: No Has patient had a PCN reaction that required hospitalization No Has patient had a PCN reaction occurring within the last 10 years: No If all of the above answers are "NO", then may proceed with Cephalosporin use.   . Sulfa Antibiotics Rash    Social History:   Social History   Socioeconomic History  . Marital status: Married    Spouse name: Not on file  . Number of children: 2  . Years of education: Masters  . Highest education level: Not on file  Occupational History  . Occupation: Retired Company secretary -Pensions consultant  Social Needs  . Financial resource strain: Not on file  . Food insecurity:    Worry: Not on file    Inability: Not on file  . Transportation needs:    Medical: Not on file    Non-medical: Not on file  Tobacco Use  . Smoking status: Never Smoker  . Smokeless tobacco: Never Used  Substance and Sexual Activity  . Alcohol use: No    Alcohol/week: 0.0 oz  . Drug use: No  . Sexual activity: Never  Lifestyle  . Physical activity:    Days per week: Not on file    Minutes per session: Not on file  . Stress: Not on file  Relationships  . Social connections:    Talks on phone: Not on file    Gets together: Not on file    Attends religious service: Not on file    Active member of club or organization: Not on file    Attends meetings of clubs or organizations: Not on file    Relationship status: Not on file  . Intimate partner violence:    Fear of current or ex partner: Not on file    Emotionally abused: Not on file    Physically abused: Not on file    Forced sexual activity: Not on file  Other Topics Concern  . Not on file  Social History Narrative   Lives at [place] 01/09/15   Married - Violet   Never smoked   Alcohol none   Previously employed as Scientist, forensic for [employer].         Diet:Low sodium   Do you drink/eat things with caffeine?  No   Marital status: Married  What year were you married?1950   Do you live in a house, apartment, assisted living, condo, trailer, etc)?    Is it one or more stories? 1   How many persons live in your home? 2   Do you have any pets in your home? No   Current or past profession: Minister, [employer]   Do you exercise?     Very Little                                                 Type & how often:    Do you have a living will?  Yes   Do you have a DNR Form? Yes   Do you have a POA/HPOA forms? Yes    Family History:   Family History  Problem Relation Age of Onset  . Coronary artery disease Mother   . Diabetes Mother   . Heart disease Mother   . Coronary artery disease Father   . Diabetes Father   . Lung cancer Father   . Heart disease Father   . Anuerysm Son   . Heart disease Brother      ROS:  Please see the history of present illness.  All other ROS reviewed and negative.     Physical Exam/Data:   Vitals:   08/31/17 1352 08/31/17 2208 09/01/17 0500 09/01/17 0504  BP: (!) 146/86 (!) 121/91  (!) 144/87  Pulse: 72 74  68  Resp: 18 18  17   Temp: 97.8 F (36.6 C) 97.7 F (36.5 C)  98.5 F (36.9 C)  TempSrc: Oral Oral  Oral  SpO2: 93% 96%  95%  Weight:   166 lb 0.1 oz (75.3 kg)   Height:        Intake/Output Summary (Last 24 hours) at 09/01/2017 1019 Last data filed at 09/01/2017 0506 Gross per 24 hour  Intake 1217 ml  Output 1625 ml  Net -408 ml   Filed Weights   08/30/17 0500 08/31/17 0500 09/01/17 0500  Weight: 173 lb 4.5 oz (78.6 kg) 165 lb 12.8 oz (75.2 kg) 166 lb 0.1 oz (75.3 kg)   Body mass index is 23.82 kg/m.  General: frail appearing elderly man, well developed, in no acute distress HEENT: normal Lymph: no adenopathy Neck: 6 cm JVD Endocrine:  No thryomegaly Vascular: No carotid bruits; FA pulses 2+ bilaterally without bruits  Cardiac:  RRR; 2/6 MR murmur  Lungs:   clear to auscultation bilaterally, no  wheezing, rhonchi or rales  Abd: soft, nontender  Ext: no edema Musculoskeletal:  No deformities, BUE and BLE strength normal and equal Skin: warm and dry  Neuro:  CNs 2-12 intact, no focal abnormalities noted Psych:  Normal affect   EKG:  The EKG was personally reviewed and demonstrates:   A paced, RBBB Telemetry:  Telemetry was personally reviewed and demonstrates:  nsr  Relevant CV Studies:  08/20/16: TTE LVEF 35% Doppler hemodynamics c/w abnormal relaxation Mod AS/mild AI Mild MR Mild TR, normal PASP Mild LVH  Laboratory Data:  Chemistry Recent Labs  Lab 08/27/17 0423 08/29/17 1022 08/31/17 0342  NA 142 140 139  K 3.8 3.8 3.7  CL 108 106 106  CO2 26 25 25   GLUCOSE 109* 113* 120*  BUN 18 15 17   CREATININE 0.57* 0.55* 0.51*  CALCIUM 8.5* 8.8* 8.3*  GFRNONAA >60 >60 >60  GFRAA >60 >60 >60  ANIONGAP 8 9 8     Recent Labs  Lab 08/25/17 1346 08/26/17 0426 08/27/17 0423  PROT 6.8 6.3* 6.2*  ALBUMIN 3.1* 2.7* 2.5*  AST 23 24 22   ALT 19 15 17   ALKPHOS 65 62 59  BILITOT 0.5 1.1 0.6   Hematology Recent Labs  Lab 08/27/17 0423 08/29/17 1022 08/31/17 0342  WBC 8.9 11.5* 8.1  RBC 3.80* 4.25 3.96*  HGB 12.1* 13.7 12.4*  HCT 36.9* 40.5 38.4*  MCV 97.1 95.3 97.0  MCH 31.8 32.2 31.3  MCHC 32.8 33.8 32.3  RDW 14.6 14.2 14.0  PLT 243 322 339   Cardiac Enzymes Recent Labs  Lab 08/26/17 1439 08/26/17 2136 08/27/17 0423  TROPONINI 0.03* 0.03* 0.03*    Recent Labs  Lab 08/25/17 1353  TROPIPOC 0.00    BNP Recent Labs  Lab 08/25/17 1813  BNP 378.4*    DDimer No results for input(s): DDIMER in the last 168 hours.  Radiology/Studies:  Ct Soft Tissue Neck W Contrast Result Date: 08/29/2017 CLINICAL DATA:  Neck pain and BILATERAL shoulder pain. Difficulty swallowing. EXAM: CT NECK WITH CONTRAST TECHNIQUE: Multidetector CT imaging of the neck was performed using the standard protocol following the bolus administration of intravenous contrast. CONTRAST:   73mL OMNIPAQUE IOHEXOL 300 MG/ML  SOLN COMPARISON:  None. FINDINGS: Pharynx and larynx: Normal. No mass or swelling. Salivary glands: No inflammation, mass, or stone. Thyroid: Normal. Lymph nodes: None enlarged or abnormal density. Vascular: Atherosclerosis.  Vessel patency is established. Limited intracranial: Generalized atrophy. Visualized orbits: Barely visualized, no gross abnormality. Mastoids and visualized paranasal sinuses: No layering sinus or mastoid fluid. Skeleton: Spondylosis.  No worrisome osseous lesion. Upper chest: No pneumothorax. Mild vascular congestion. No visible lung mass. Pacemaker, LEFT subclavian approach. Prior median sternotomy for CABG. Other: Healed IMPRESSION: No pharyngeal or neck inflammatory process is evident. Airway appears patent. No esophageal air-fluid level or significant neck mass or adenopathy is evident. Electronically Signed   By: Staci Righter M.D.   On: 08/29/2017 13:07             Assessment and Plan:   1. Recurrent UTI     Klebsiella and E.Coli  2. Bacteremia     Klebsiella oxytoca     Repeat cultures drawn today 3. Odynophagia     GI on case, suspect possible neurologic/muscular etiology, not a candidate for any endoscopic procedures     Speech eval noted severe dysphagia, to be kept NPO     Recommended palliative eval 4. Neck pain     C-spine spondylolitsthesis     Neuro-surg rec out patient f/u  5. PPM     SJM dual chamber PPM, implanted initially March 2010 had gen change 2018     Known to be atrially dependent      Now with Klebsiella bacteremia, recurrent UTI with E.coli and Klebsiella    For questions or updates, please contact [place] Please consult [email or web address] for contact info under Cardiology/STEMI.   Signed, Baldwin Jamaica, PA-C  09/01/2017 10:19 AM   EP Attending  Patient seen and examined. Agree with above. The patient has a UTI complicated by GNR (klebsiella) bacteremia. He is improving and his repeat blood  cultures are sterile. My experience with bacteremia and CIED's has been that Klebsiella and other gram negative rods almost never cause Pacemaker or ICD lead endocarditis. I have never seen one. I think a TEE would be a low yield proposition and almost certainly not  show device SBE. Endocarditis involving his valves might be slightly more likely but not much. I would not recommend TEE or lead extraction. I will defer duration of anti-biotic therapy to the ID service. His multiple comorbidities certainly make him higher risk for recurrence especially if he has prostatitis.   Mikle Bosworth.D.

## 2017-09-01 NOTE — Progress Notes (Signed)
PROGRESS NOTE    Adam [name]  SJG:[id number] DOB: 02-Aug-1926 DOA: 08/25/2017 PCP: Blanchie Serve, MD   Brief Narrative: Adam [name] is a 84 y.o. male with a history of CAD status post CABG, chronic combined CHF, depression, anxiety, status post PPM, CVA, GERD, chronic Foley catheter, restless leg syndrome, chronic hypertension.  Patient presented secondary to generalized body aches and weakness.  He is found to have a likely UTI.  He was started on ceftriaxone and cultures are significant for E. coli and Klebsiella oxytoca.  He has significant neck pain in addition to odynophagia.  Neck pain appears to be musculoskeletal most likely.  GI consulted for odynophagia, which also may be musculoskeletal related.   Assessment & Plan:   Active Problems:   Cardiomyopathy, ischemic   Pacemaker   Hyperlipidemia   SSS (sick sinus syndrome) (HCC)   Hypothyroidism   GERD (gastroesophageal reflux disease)   Urinary retention   Coronary artery disease involving native coronary artery of native heart without angina pectoris   Expressive aphasia   RLS (restless legs syndrome)   Combined congestive systolic and diastolic heart failure (HCC)   Urinary tract infection   Hx of CABG   Nonrheumatic aortic valve stenosis   History of arterial ischemic stroke   Weak   Decubitus ulcer of sacral region, stage 2   Neurogenic orthostatic hypotension (HCC)   Neurogenic bladder   Generalized weakness   Shoulder pain, bilateral   Weakness Fall Unknown etiology. Possibly related to UTI. Patient with degenerative neck disease which may also be playing a role. X-ray obtained of bilateral shoulders which was significant for mild arthritis. Per wife, she does not think his weakness is significantly far from baseline. -PT/OT eval  UTI Patient with a chronic indwelling catheter. Urine culture significant for E. Coli and klebsiella. Both are sensitive to ceftriaxone. -Continue ceftriaxone  Klebsiella  oxytoca bacteremia Secondary to UTI. -Antibiotics as above -EP recommendations in setting of PPM -infectious disease recommendations  Odynophagia Dysphagia New problem. ?musculoskeletal in nature, but symptoms are severe per patient and he is unable to swallow at all. GI without recommendations. Speech therapy recommending NPO secondary to failed MBS -Palliative care consulted. Appreciate recommendations -SLP: MBS today; if fails, will place NG tube  Neck pain I think most of this is musculoskeletal. Question possible rheumatologic etiology. Seems to be improving with empiric steroid treatment. Some improvement with muscle relaxer. -Valium prn -Norco and morphine prn -Continue Solu-medrol  Spondylolisthesis of cervical spine Discussed with neurosurgery, Dr. Ronnald Ramp, who recommend outpatient follow-up. -Soft neck collar for comfort  Hypothyroidism TSH normal. -Continue Synthroid  GERD -Continue Protonix  S/p pacemaker  Restless leg syndrome -Continue Requip  History of CVA Expressive aphasia -Continue aspirin and plavix  Orthostatic hypotension Chronic -Continue midodrine and droxidopa (unable to obtain inpatient) if able to take by mouth  Hyperlipidemia -Continue pravastatin. Doubt this is contributing. Held while NPO  Chronic combined systolic and diastolic heart failure History of CAD s/p CABG No chest pain. -Continue aspirin rectal and plavix (held while NPO)  Moisture associated skin damage Present on gluteal fold and right buttock crease. Present on admission. Wound care.  Tremor One time episode as an outpatient. No recurrence. In setting of UTI and bacteremia. Discussed with neurology. If recurs, will formally consult.   DVT prophylaxis: Heparin subq Code Status:   Code Status: Full Code Family Communication: Wife at bedside Disposition Plan: Discharge pending improvement of pain   Consultants:   Neurosurgery  (telephone)  Gastroenterology  Palliative care medicine  Procedures:   None  Antimicrobials:  Ceftriaxone    Subjective: Pain continues to improve  Objective: Vitals:   08/31/17 2208 09/01/17 0500 09/01/17 0504 09/01/17 1310  BP: (!) 121/91  (!) 144/87 135/72  Pulse: 74  68 70  Resp: 18  17 16   Temp: 97.7 F (36.5 C)  98.5 F (36.9 C) 98.1 F (36.7 C)  TempSrc: Oral  Oral Oral  SpO2: 96%  95% 95%  Weight:  75.3 kg (166 lb 0.1 oz)    Height:        Intake/Output Summary (Last 24 hours) at 09/01/2017 1437 Last data filed at 09/01/2017 1052 Gross per 24 hour  Intake 1217 ml  Output 2075 ml  Net -858 ml   Filed Weights   08/30/17 0500 08/31/17 0500 09/01/17 0500  Weight: 78.6 kg (173 lb 4.5 oz) 75.2 kg (165 lb 12.8 oz) 75.3 kg (166 lb 0.1 oz)    Examination:  General exam: Appears calm and comfortable Respiratory system: Clear to auscultation. Respiratory effort normal. Cardiovascular system: S1 & S2 heard, RRR. No murmurs. Gastrointestinal system: Abdomen is nondistended, soft and nontender. No organomegaly or masses felt. Normal bowel sounds heard. Central nervous system: Alert and oriented. No focal neurological deficits. Musculoskeletal: Mild trapezius tenderness with tenderness along SCM bilaterally Skin: No cyanosis. No rashes Psychiatry: Judgement and insight appear normal. Mood & affect appropriate.    Data Reviewed: I have personally reviewed following labs and imaging studies  CBC: Recent Labs  Lab 08/27/17 0423 08/29/17 1022 08/31/17 0342  WBC 8.9 11.5* 8.1  NEUTROABS 6.5  --   --   HGB 12.1* 13.7 12.4*  HCT 36.9* 40.5 38.4*  MCV 97.1 95.3 97.0  PLT 243 322 759   Basic Metabolic Panel: Recent Labs  Lab 08/26/17 0426 08/27/17 0423 08/29/17 1022 08/31/17 0342  NA 139 142 140 139  K 4.2 3.8 3.8 3.7  CL 107 108 106 106  CO2 25 26 25 25   GLUCOSE 104* 109* 113* 120*  BUN 13 18 15 17   CREATININE 0.49* 0.57* 0.55* 0.51*  CALCIUM 8.6*  8.5* 8.8* 8.3*  MG 2.2 2.1  --   --   PHOS 3.4  --   --   --    GFR: Estimated Creatinine Clearance: 65.9 mL/min (A) (by C-G formula based on SCr of 0.51 mg/dL (L)). Liver Function Tests: Recent Labs  Lab 08/26/17 0426 08/27/17 0423  AST 24 22  ALT 15 17  ALKPHOS 62 59  BILITOT 1.1 0.6  PROT 6.3* 6.2*  ALBUMIN 2.7* 2.5*   No results for input(s): LIPASE, AMYLASE in the last 168 hours. No results for input(s): AMMONIA in the last 168 hours. Coagulation Profile: No results for input(s): INR, PROTIME in the last 168 hours. Cardiac Enzymes: Recent Labs  Lab 08/26/17 1439 08/26/17 2136 08/27/17 0423  CKTOTAL 102  --   --   TROPONINI 0.03* 0.03* 0.03*   BNP (last 3 results) Recent Labs    09/17/16 1517  PROBNP 721.0*   HbA1C: No results for input(s): HGBA1C in the last 72 hours. CBG: Recent Labs  Lab 08/28/17 0808 08/29/17 0728 08/30/17 0740 08/31/17 0747 09/01/17 0739  GLUCAP 110* 124* 112* 94 104*   Lipid Profile: No results for input(s): CHOL, HDL, LDLCALC, TRIG, CHOLHDL, LDLDIRECT in the last 72 hours. Thyroid Function Tests: No results for input(s): TSH, T4TOTAL, FREET4, T3FREE, THYROIDAB in the last 72 hours. Anemia Panel: No results for input(s):  VITAMINB12, FOLATE, FERRITIN, TIBC, IRON, RETICCTPCT in the last 72 hours. Sepsis Labs: Recent Labs  Lab 08/27/17 0423  LATICACIDVEN 0.9    Recent Results (from the past 240 hour(s))  Urine Culture     Status: Abnormal   Collection Time: 08/25/17  2:29 PM  Result Value Ref Range Status   Specimen Description   Final    URINE, CLEAN CATCH Performed at [facility], [place] [address] [place], [place] [address]    Special Requests   Final    NONE Performed at [facility], [place] [address], [place], [place] [address]    Culture (A)  Final    >=100,000 COLONIES/mL ESCHERICHIA COLI >=100,000 COLONIES/mL KLEBSIELLA OXYTOCA    Report Status 08/28/2017 FINAL  Final    Organism ID, Bacteria ESCHERICHIA COLI (A)  Final   Organism ID, Bacteria KLEBSIELLA OXYTOCA (A)  Final      Susceptibility   Escherichia coli - MIC*    AMPICILLIN <=2 SENSITIVE Sensitive     CEFAZOLIN <=4 SENSITIVE Sensitive     CEFTRIAXONE <=1 SENSITIVE Sensitive     CIPROFLOXACIN <=0.25 SENSITIVE Sensitive     GENTAMICIN <=1 SENSITIVE Sensitive     IMIPENEM <=0.25 SENSITIVE Sensitive     NITROFURANTOIN <=16 SENSITIVE Sensitive     TRIMETH/SULFA <=20 SENSITIVE Sensitive     AMPICILLIN/SULBACTAM <=2 SENSITIVE Sensitive     PIP/TAZO <=4 SENSITIVE Sensitive     Extended ESBL NEGATIVE Sensitive     * >=100,000 COLONIES/mL ESCHERICHIA COLI   Klebsiella oxytoca - MIC*    AMPICILLIN RESISTANT Resistant     CEFAZOLIN >=64 RESISTANT Resistant     CEFTRIAXONE <=1 SENSITIVE Sensitive     CIPROFLOXACIN <=0.25 SENSITIVE Sensitive     GENTAMICIN <=1 SENSITIVE Sensitive     IMIPENEM <=0.25 SENSITIVE Sensitive     NITROFURANTOIN 32 SENSITIVE Sensitive     TRIMETH/SULFA <=20 SENSITIVE Sensitive     AMPICILLIN/SULBACTAM 4 SENSITIVE Sensitive     PIP/TAZO <=4 SENSITIVE Sensitive     Extended ESBL NEGATIVE Sensitive     * >=100,000 COLONIES/mL KLEBSIELLA OXYTOCA  Blood culture (routine x 2)     Status: Abnormal   Collection Time: 08/25/17  2:47 PM  Result Value Ref Range Status   Specimen Description   Final    BLOOD LEFT ARM Performed at [facility] [address] [place], [place] [address]    Special Requests   Final    BOTTLES DRAWN AEROBIC AND ANAEROBIC Blood Culture adequate volume Performed at [facility] [address] [place], [place] [address]    Culture  Setup Time   Final    AEROBIC BOTTLE ONLY GRAM NEGATIVE RODS CRITICAL RESULT CALLED TO, READ BACK BY AND VERIFIED WITH: A PHAM PHARMD AT 0836 ON [id number] BY SJW Performed at [facility], [place] [address] [place], [place] [address]    Culture KLEBSIELLA OXYTOCA (A)  Final   Report  Status 08/31/2017 FINAL  Final   Organism ID, Bacteria KLEBSIELLA OXYTOCA  Final      Susceptibility   Klebsiella oxytoca - MIC*    AMPICILLIN RESISTANT Resistant     CEFAZOLIN >=64 RESISTANT Resistant     CEFEPIME <=1 SENSITIVE Sensitive     CEFTAZIDIME <=1 SENSITIVE Sensitive     CEFTRIAXONE <=1 SENSITIVE Sensitive     CIPROFLOXACIN <=0.25 SENSITIVE Sensitive     GENTAMICIN <=1 SENSITIVE Sensitive     IMIPENEM <=0.25 SENSITIVE Sensitive  TRIMETH/SULFA <=20 SENSITIVE Sensitive     AMPICILLIN/SULBACTAM 8 SENSITIVE Sensitive     PIP/TAZO <=4 SENSITIVE Sensitive     * KLEBSIELLA OXYTOCA  Blood Culture ID Panel (Reflexed)     Status: Abnormal   Collection Time: 08/25/17  2:47 PM  Result Value Ref Range Status   Enterococcus species NOT DETECTED NOT DETECTED Final   Listeria monocytogenes NOT DETECTED NOT DETECTED Final   Staphylococcus species NOT DETECTED NOT DETECTED Final   Staphylococcus aureus NOT DETECTED NOT DETECTED Final   Streptococcus species NOT DETECTED NOT DETECTED Final   Streptococcus agalactiae NOT DETECTED NOT DETECTED Final   Streptococcus pneumoniae NOT DETECTED NOT DETECTED Final   Streptococcus pyogenes NOT DETECTED NOT DETECTED Final   Acinetobacter baumannii NOT DETECTED NOT DETECTED Final   Enterobacteriaceae species DETECTED (A) NOT DETECTED Final    Comment: Enterobacteriaceae represent a large family of gram-negative bacteria, not a single organism. CRITICAL RESULT CALLED TO, READ BACK BY AND VERIFIED WITH: A PHAM PHARMD AT 0836 ON [id number] BY SJW    Enterobacter cloacae complex NOT DETECTED NOT DETECTED Final   Escherichia coli NOT DETECTED NOT DETECTED Final   Klebsiella oxytoca DETECTED (A) NOT DETECTED Final    Comment: CRITICAL RESULT CALLED TO, READ BACK BY AND VERIFIED WITH: A PHAM PHARMD AT 0836 ON [id number] BY SJW    Klebsiella pneumoniae NOT DETECTED NOT DETECTED Final   Proteus species NOT DETECTED NOT DETECTED Final   Serratia marcescens NOT  DETECTED NOT DETECTED Final   Carbapenem resistance NOT DETECTED NOT DETECTED Final   Haemophilus influenzae NOT DETECTED NOT DETECTED Final   Neisseria meningitidis NOT DETECTED NOT DETECTED Final   Pseudomonas aeruginosa NOT DETECTED NOT DETECTED Final   Candida albicans NOT DETECTED NOT DETECTED Final   Candida glabrata NOT DETECTED NOT DETECTED Final   Candida krusei NOT DETECTED NOT DETECTED Final   Candida parapsilosis NOT DETECTED NOT DETECTED Final   Candida tropicalis NOT DETECTED NOT DETECTED Final    Comment: Performed at [facility], [place] [address] [place], [place] [address]  Blood culture (routine x 2)     Status: None   Collection Time: 08/25/17  2:52 PM  Result Value Ref Range Status   Specimen Description   Final    BLOOD RIGHT ARM Performed at [facility] [address] [place], [place] [address]    Special Requests   Final    BOTTLES DRAWN AEROBIC AND ANAEROBIC Blood Culture adequate volume Performed at [facility] [address], [place], [place] [address]    Culture   Final    NO GROWTH 5 DAYS Performed at [facility], [place] [address], [place], [place] [address]    Report Status 08/30/2017 FINAL  Final  MRSA PCR Screening     Status: Abnormal   Collection Time: 08/25/17  6:18 PM  Result Value Ref Range Status   MRSA by PCR POSITIVE (A) NEGATIVE Final    Comment:        The GeneXpert MRSA Assay (FDA approved for NASAL specimens only), is one component of a comprehensive MRSA colonization surveillance program. It is not intended to diagnose MRSA infection nor to guide or monitor treatment for MRSA infections. RESULT CALLED TO, READ BACK BY AND VERIFIED WITHTod Persia RN [id number] 08/25/17 A NAVARRO Performed at [facility], [place] [address] [place], [place] [address]   Culture, blood (routine x 2)     Status:  None (Preliminary result)   Collection Time: 08/31/17  3:42 AM  Result  Value Ref Range Status   Specimen Description   Final    BLOOD RIGHT ANTECUBITAL Performed at [facility] [address], [place], [place] [address]    Special Requests   Final    BOTTLES DRAWN AEROBIC AND ANAEROBIC Blood Culture adequate volume Performed at [facility] [address], [place], [place] [address]    Culture   Final    NO GROWTH 1 DAY Performed at [facility], [place] [address], [place], [place] [address]    Report Status PENDING  Incomplete  Culture, blood (routine x 2)     Status: None (Preliminary result)   Collection Time: 08/31/17  3:42 AM  Result Value Ref Range Status   Specimen Description   Final    BLOOD LEFT HAND Performed at [facility] [address] [place], [place] [address]    Special Requests   Final    BOTTLES DRAWN AEROBIC ONLY Blood Culture adequate volume Performed at [facility] [address], [place], [place] [address]    Culture   Final    NO GROWTH 1 DAY Performed at [facility], [place] [address], [place], [place] [address]    Report Status PENDING  Incomplete         Radiology Studies: No results found.      Scheduled Meds: . aspirin  300 mg Rectal Daily  . heparin  5,000 Units Subcutaneous Q8H  . levothyroxine  50 mcg Intravenous Daily  . methylPREDNISolone (SOLU-MEDROL) injection  40 mg Intravenous Daily   Continuous Infusions: . cefTRIAXone (ROCEPHIN)  IV 2 g (09/01/17 1412)  . dextrose 5 % and 0.45% NaCl 100 mL/hr at 09/01/17 0509     LOS: 7 days     Cordelia Poche, MD [facility] 09/01/2017, 2:37 PM Pager: [phone]  If 7PM-7AM, please contact night-coverage [email or web address] 09/01/2017, 2:37 PM

## 2017-09-01 NOTE — Care Management Important Message (Signed)
Important Message  Patient Details  Name: STORMY SABOL MRN: [id number] Date of Birth: [birth year]   Medicare Important Message Given:  Yes    Kerin Salen 09/01/2017, 11:32 AMImportant Message  Patient Details  Name: SHRAVAN SALAHUDDIN MRN: [id number] Date of Birth: 05/19/1926   Medicare Important Message Given:  Yes    Kerin Salen 09/01/2017, 11:32 AM

## 2017-09-01 NOTE — Progress Notes (Addendum)
TEE request received to [employer] inbox, initially placed on schedule but patient's chart was reviewed by Dr. Johnsie Cancel - he does not feel patient is currently appropriate for TEE given neck pain and odynophagia. He recommends to start with a surface echo and to contact cardiology back once swallowing issues resolved. I relayed these recs to Dr. Lonny Prude who did pass on that ID would like EP involved. I called EP APP - they had not received any requests from ID yet regarding this. They will see later today.  Dayna Dunn PA-C

## 2017-09-01 NOTE — Progress Notes (Signed)
INFECTIOUS DISEASE PROGRESS NOTE  ID: Adam [name] is a 84 y.o. male with  Active Problems:   Cardiomyopathy, ischemic   Pacemaker   Hyperlipidemia   SSS (sick sinus syndrome) ([place])   Hypothyroidism   GERD (gastroesophageal reflux disease)   Urinary retention   Coronary artery disease involving native coronary artery of native heart without angina pectoris   Expressive aphasia   RLS (restless legs syndrome)   Combined congestive systolic and diastolic heart failure (HCC)   Urinary tract infection   Hx of CABG   Nonrheumatic aortic valve stenosis   History of arterial ischemic stroke   Weak   Decubitus ulcer of sacral region, stage 2   Neurogenic orthostatic hypotension (HCC)   Neurogenic bladder   Generalized weakness   Shoulder pain, bilateral  Subjective: No complaints.   Abtx:  Anti-infectives (From admission, onward)   Start     Dose/Rate Route Frequency Ordered Stop   08/29/17 1400  cefTRIAXone (ROCEPHIN) 2 g in sodium chloride 0.9 % 100 mL IVPB     2 g 200 mL/hr over 30 Minutes Intravenous Every 24 hours 08/29/17 0845     08/26/17 1400  cefTRIAXone (ROCEPHIN) 1 g in sodium chloride 0.9 % 100 mL IVPB  Status:  Discontinued     1 g 200 mL/hr over 30 Minutes Intravenous Every 24 hours 08/25/17 1757 08/29/17 0845   08/25/17 1430  cefTRIAXone (ROCEPHIN) 1 g in sodium chloride 0.9 % 100 mL IVPB     1 g 200 mL/hr over 30 Minutes Intravenous  Once 08/25/17 1428 08/25/17 1540      Medications:  Scheduled: . aspirin  300 mg Rectal Daily  . heparin  5,000 Units Subcutaneous Q8H  . levothyroxine  50 mcg Intravenous Daily  . methylPREDNISolone (SOLU-MEDROL) injection  40 mg Intravenous Daily    Objective: Vital signs in last 24 hours: Temp:  [97.7 F (36.5 C)-98.5 F (36.9 C)] 98.1 F (36.7 C) (07/29 1310) Pulse Rate:  [68-74] 70 (07/29 1310) Resp:  [16-18] 16 (07/29 1310) BP: (121-144)/(72-91) 135/72 (07/29 1310) SpO2:  [95 %-96 %] 95 % (07/29  1310) Weight:  [75.3 kg (166 lb 0.1 oz)] 75.3 kg (166 lb 0.1 oz) (07/29 0500)   General appearance: alert, cooperative and no distress Resp: clear to auscultation bilaterally Cardio: regular rate and rhythm GI: normal findings: bowel sounds normal and soft, non-tender Extremities: edema none  Lab Results Recent Labs    08/31/17 0342  WBC 8.1  HGB 12.4*  HCT 38.4*  NA 139  K 3.7  CL 106  CO2 25  BUN 17  CREATININE 0.51*   Liver Panel No results for input(s): PROT, ALBUMIN, AST, ALT, ALKPHOS, BILITOT, BILIDIR, IBILI in the last 72 hours. Sedimentation Rate No results for input(s): ESRSEDRATE in the last 72 hours. C-Reactive Protein No results for input(s): CRP in the last 72 hours.  Microbiology: Recent Results (from the past 240 hour(s))  Urine Culture     Status: Abnormal   Collection Time: 08/25/17  2:29 PM  Result Value Ref Range Status   Specimen Description   Final    URINE, CLEAN CATCH Performed at [facility] [address], [place], [place] [address]    Special Requests   Final    NONE Performed at [facility], [place] [address], [place],  [address]    Culture (A)  Final    >=100,000 COLONIES/mL ESCHERICHIA COLI >=100,000 COLONIES/mL KLEBSIELLA OXYTOCA  Report Status 08/28/2017 FINAL  Final   Organism ID, Bacteria ESCHERICHIA COLI (A)  Final   Organism ID, Bacteria KLEBSIELLA OXYTOCA (A)  Final      Susceptibility   Escherichia coli - MIC*    AMPICILLIN <=2 SENSITIVE Sensitive     CEFAZOLIN <=4 SENSITIVE Sensitive     CEFTRIAXONE <=1 SENSITIVE Sensitive     CIPROFLOXACIN <=0.25 SENSITIVE Sensitive     GENTAMICIN <=1 SENSITIVE Sensitive     IMIPENEM <=0.25 SENSITIVE Sensitive     NITROFURANTOIN <=16 SENSITIVE Sensitive     TRIMETH/SULFA <=20 SENSITIVE Sensitive     AMPICILLIN/SULBACTAM <=2 SENSITIVE Sensitive     PIP/TAZO <=4 SENSITIVE Sensitive     Extended ESBL NEGATIVE Sensitive     * >=100,000  COLONIES/mL ESCHERICHIA COLI   Klebsiella oxytoca - MIC*    AMPICILLIN RESISTANT Resistant     CEFAZOLIN >=64 RESISTANT Resistant     CEFTRIAXONE <=1 SENSITIVE Sensitive     CIPROFLOXACIN <=0.25 SENSITIVE Sensitive     GENTAMICIN <=1 SENSITIVE Sensitive     IMIPENEM <=0.25 SENSITIVE Sensitive     NITROFURANTOIN 32 SENSITIVE Sensitive     TRIMETH/SULFA <=20 SENSITIVE Sensitive     AMPICILLIN/SULBACTAM 4 SENSITIVE Sensitive     PIP/TAZO <=4 SENSITIVE Sensitive     Extended ESBL NEGATIVE Sensitive     * >=100,000 COLONIES/mL KLEBSIELLA OXYTOCA  Blood culture (routine x 2)     Status: Abnormal   Collection Time: 08/25/17  2:47 PM  Result Value Ref Range Status   Specimen Description   Final    BLOOD LEFT ARM Performed at [facility] [address] [place], [place] [address]    Special Requests   Final    BOTTLES DRAWN AEROBIC AND ANAEROBIC Blood Culture adequate volume Performed at [facility] [address], [place], [place] [address]    Culture  Setup Time   Final    AEROBIC BOTTLE ONLY GRAM NEGATIVE RODS CRITICAL RESULT CALLED TO, READ BACK BY AND VERIFIED WITH: A PHAM PHARMD AT 0836 ON [id number] BY SJW Performed at [facility], [place] [address], [place], [place] [address]    Culture KLEBSIELLA OXYTOCA (A)  Final   Report Status 08/31/2017 FINAL  Final   Organism ID, Bacteria KLEBSIELLA OXYTOCA  Final      Susceptibility   Klebsiella oxytoca - MIC*    AMPICILLIN RESISTANT Resistant     CEFAZOLIN >=64 RESISTANT Resistant     CEFEPIME <=1 SENSITIVE Sensitive     CEFTAZIDIME <=1 SENSITIVE Sensitive     CEFTRIAXONE <=1 SENSITIVE Sensitive     CIPROFLOXACIN <=0.25 SENSITIVE Sensitive     GENTAMICIN <=1 SENSITIVE Sensitive     IMIPENEM <=0.25 SENSITIVE Sensitive     TRIMETH/SULFA <=20 SENSITIVE Sensitive     AMPICILLIN/SULBACTAM 8 SENSITIVE Sensitive     PIP/TAZO <=4 SENSITIVE Sensitive     * KLEBSIELLA OXYTOCA  Blood Culture ID  Panel (Reflexed)     Status: Abnormal   Collection Time: 08/25/17  2:47 PM  Result Value Ref Range Status   Enterococcus species NOT DETECTED NOT DETECTED Final   Listeria monocytogenes NOT DETECTED NOT DETECTED Final   Staphylococcus species NOT DETECTED NOT DETECTED Final   Staphylococcus aureus NOT DETECTED NOT DETECTED Final   Streptococcus species NOT DETECTED NOT DETECTED Final   Streptococcus agalactiae NOT DETECTED NOT DETECTED Final   Streptococcus pneumoniae NOT DETECTED NOT DETECTED Final   Streptococcus pyogenes NOT DETECTED NOT  DETECTED Final   Acinetobacter baumannii NOT DETECTED NOT DETECTED Final   Enterobacteriaceae species DETECTED (A) NOT DETECTED Final    Comment: Enterobacteriaceae represent a large family of gram-negative bacteria, not a single organism. CRITICAL RESULT CALLED TO, READ BACK BY AND VERIFIED WITH: A PHAM PHARMD AT 0836 ON [id number] BY SJW    Enterobacter cloacae complex NOT DETECTED NOT DETECTED Final   Escherichia coli NOT DETECTED NOT DETECTED Final   Klebsiella oxytoca DETECTED (A) NOT DETECTED Final    Comment: CRITICAL RESULT CALLED TO, READ BACK BY AND VERIFIED WITH: A PHAM PHARMD AT 0836 ON [id number] BY SJW    Klebsiella pneumoniae NOT DETECTED NOT DETECTED Final   Proteus species NOT DETECTED NOT DETECTED Final   Serratia marcescens NOT DETECTED NOT DETECTED Final   Carbapenem resistance NOT DETECTED NOT DETECTED Final   Haemophilus influenzae NOT DETECTED NOT DETECTED Final   Neisseria meningitidis NOT DETECTED NOT DETECTED Final   Pseudomonas aeruginosa NOT DETECTED NOT DETECTED Final   Candida albicans NOT DETECTED NOT DETECTED Final   Candida glabrata NOT DETECTED NOT DETECTED Final   Candida krusei NOT DETECTED NOT DETECTED Final   Candida parapsilosis NOT DETECTED NOT DETECTED Final   Candida tropicalis NOT DETECTED NOT DETECTED Final    Comment: Performed at [facility], [place] [address] [place], [place] [address]  Blood culture  (routine x 2)     Status: None   Collection Time: 08/25/17  2:52 PM  Result Value Ref Range Status   Specimen Description   Final    BLOOD RIGHT ARM Performed at [facility] [address], [place], [place] [address]    Special Requests   Final    BOTTLES DRAWN AEROBIC AND ANAEROBIC Blood Culture adequate volume Performed at [facility] [address] [place], [place] [address]    Culture   Final    NO GROWTH 5 DAYS Performed at [facility], [place] [address], [place], [place] [address]    Report Status 08/30/2017 FINAL  Final  MRSA PCR Screening     Status: Abnormal   Collection Time: 08/25/17  6:18 PM  Result Value Ref Range Status   MRSA by PCR POSITIVE (A) NEGATIVE Final    Comment:        The GeneXpert MRSA Assay (FDA approved for NASAL specimens only), is one component of a comprehensive MRSA colonization surveillance program. It is not intended to diagnose MRSA infection nor to guide or monitor treatment for MRSA infections. RESULT CALLED TO, READ BACK BY AND VERIFIED WITHTod Persia RN [id number] 08/25/17 A NAVARRO Performed at [facility], [place] [address], [place], [place] [address]   Culture, blood (routine x 2)     Status: None (Preliminary result)   Collection Time: 08/31/17  3:42 AM  Result Value Ref Range Status   Specimen Description   Final    BLOOD RIGHT ANTECUBITAL Performed at [facility] [address] [place], [place] [address]    Special Requests   Final    BOTTLES DRAWN AEROBIC AND ANAEROBIC Blood Culture adequate volume Performed at [facility] [address] [place],  [address]    Culture   Final    NO GROWTH 1 DAY Performed at [facility], [place] [address], [place],  [address]    Report Status PENDING  Incomplete  Culture, blood (routine x 2)     Status: None (  Preliminary result)   Collection Time: 08/31/17  3:42 AM    Result Value Ref Range Status   Specimen Description   Final    BLOOD LEFT HAND Performed at [facility] [address], [place], [place] [address]    Special Requests   Final    BOTTLES DRAWN AEROBIC ONLY Blood Culture adequate volume Performed at [facility] [address], [place], [place] [address]    Culture   Final    NO GROWTH 1 DAY Performed at [facility], [place] [address] [place], [place] [address]    Report Status PENDING  Incomplete    Studies/Results: No results found.   Assessment/Plan: K oxytoca bacteremia Pacemaker 2010 UTI (E coli, K oxytoca) Chronic foley  Total days of antibiotics: 7 ceftriaxone  TEE deferred, awaiting TTE.  My appreciation to CV for letting EP know.  Repeat BCx are ngtd x 24 h Continue ceftriaxone         Bobby Rumpf MD, FACP Infectious Diseases (pager) [phone] [email or web address] 09/01/2017, 4:13 PM  LOS: 7 days

## 2017-09-02 ENCOUNTER — Inpatient Hospital Stay (HOSPITAL_COMMUNITY): Payer: Medicare Other

## 2017-09-02 DIAGNOSIS — R011 Cardiac murmur, unspecified: Secondary | ICD-10-CM

## 2017-09-02 DIAGNOSIS — I361 Nonrheumatic tricuspid (valve) insufficiency: Secondary | ICD-10-CM

## 2017-09-02 DIAGNOSIS — I350 Nonrheumatic aortic (valve) stenosis: Secondary | ICD-10-CM

## 2017-09-02 DIAGNOSIS — I700 Atherosclerosis of aorta: Secondary | ICD-10-CM

## 2017-09-02 LAB — ECHOCARDIOGRAM COMPLETE
Height: 70 in
Weight: 2705.6 oz

## 2017-09-02 MED ORDER — SODIUM CHLORIDE 0.9 % IV SOLN: INTRAVENOUS | Status: DC

## 2017-09-02 MED ORDER — AMIODARONE HCL 100 MG PO TABS
100.0000 mg | ORAL_TABLET | Freq: Every day | ORAL | Status: DC
  Administered 2017-09-02 – 2017-09-04 (×3): 100 mg via ORAL
  Filled 2017-09-02 (×3): qty 1

## 2017-09-02 MED ORDER — CLOPIDOGREL BISULFATE 75 MG PO TABS
75.0000 mg | ORAL_TABLET | Freq: Every day | ORAL | Status: DC
  Administered 2017-09-02 – 2017-09-04 (×3): 75 mg via ORAL
  Filled 2017-09-02 (×3): qty 1

## 2017-09-02 MED ORDER — SODIUM CHLORIDE 0.9 % IV SOLN
INTRAVENOUS | Status: DC | PRN
  Administered 2017-09-02: 250 mL via INTRAVENOUS

## 2017-09-02 MED ORDER — LEVOTHYROXINE SODIUM 100 MCG PO TABS
100.0000 ug | ORAL_TABLET | Freq: Every day | ORAL | Status: DC
  Administered 2017-09-02 – 2017-09-04 (×3): 100 ug via ORAL
  Filled 2017-09-02 (×3): qty 1

## 2017-09-02 MED ORDER — PRAVASTATIN SODIUM 40 MG PO TABS
40.0000 mg | ORAL_TABLET | Freq: Every day | ORAL | Status: DC
  Administered 2017-09-02 – 2017-09-03 (×2): 40 mg via ORAL
  Filled 2017-09-02 (×2): qty 1

## 2017-09-02 MED ORDER — PREDNISONE 20 MG PO TABS
20.0000 mg | ORAL_TABLET | Freq: Every day | ORAL | Status: DC
  Administered 2017-09-03 – 2017-09-04 (×2): 20 mg via ORAL
  Filled 2017-09-02 (×2): qty 1

## 2017-09-02 MED ORDER — ASPIRIN EC 81 MG PO TBEC
81.0000 mg | DELAYED_RELEASE_TABLET | Freq: Every day | ORAL | Status: DC
  Administered 2017-09-02 – 2017-09-04 (×3): 81 mg via ORAL
  Filled 2017-09-02 (×3): qty 1

## 2017-09-02 MED ORDER — MIDODRINE HCL 5 MG PO TABS
10.0000 mg | ORAL_TABLET | Freq: Three times a day (TID) | ORAL | Status: DC
  Administered 2017-09-02 – 2017-09-04 (×8): 10 mg via ORAL
  Filled 2017-09-02 (×8): qty 2

## 2017-09-02 NOTE — Progress Notes (Signed)
INFECTIOUS DISEASE PROGRESS NOTE  ID: Adam [name] is a 84 y.o. male with  Active Problems:   Cardiomyopathy, ischemic   Pacemaker   Hyperlipidemia   SSS (sick sinus syndrome) ([place])   Hypothyroidism   GERD (gastroesophageal reflux disease)   Urinary retention   Coronary artery disease involving native coronary artery of native heart without angina pectoris   Expressive aphasia   RLS (restless legs syndrome)   Combined congestive systolic and diastolic heart failure (HCC)   Urinary tract infection, acute   Hx of CABG   Nonrheumatic aortic valve stenosis   History of arterial ischemic stroke   Weak   Decubitus ulcer of sacral region, stage 2   Neurogenic orthostatic hypotension (HCC)   Neurogenic bladder   Generalized weakness   Shoulder pain, bilateral   Bacteremia  Subjective: Continued neck pain, better than prev.   Abtx:  Anti-infectives (From admission, onward)   Start     Dose/Rate Route Frequency Ordered Stop   08/29/17 1400  cefTRIAXone (ROCEPHIN) 2 g in sodium chloride 0.9 % 100 mL IVPB     2 g 200 mL/hr over 30 Minutes Intravenous Every 24 hours 08/29/17 0845     08/26/17 1400  cefTRIAXone (ROCEPHIN) 1 g in sodium chloride 0.9 % 100 mL IVPB  Status:  Discontinued     1 g 200 mL/hr over 30 Minutes Intravenous Every 24 hours 08/25/17 1757 08/29/17 0845   08/25/17 1430  cefTRIAXone (ROCEPHIN) 1 g in sodium chloride 0.9 % 100 mL IVPB     1 g 200 mL/hr over 30 Minutes Intravenous  Once 08/25/17 1428 08/25/17 1540      Medications:  Scheduled: . amiodarone  100 mg Oral Daily  . aspirin EC  81 mg Oral Daily  . clopidogrel  75 mg Oral Daily  . heparin  5,000 Units Subcutaneous Q8H  . levothyroxine  100 mcg Oral QAC breakfast  . methylPREDNISolone (SOLU-MEDROL) injection  40 mg Intravenous Daily  . midodrine  10 mg Oral TID WC  . pravastatin  40 mg Oral QHS    Objective: Vital signs in last 24 hours: Temp:  [97.8 F (36.6 C)-98.1 F (36.7 C)] 98.1 F  (36.7 C) (07/30 1405) Pulse Rate:  [69-74] 74 (07/30 1405) Resp:  [16-20] 16 (07/30 1405) BP: (104-121)/(64-75) 107/68 (07/30 1405) SpO2:  [93 %-97 %] 97 % (07/30 1405) Weight:  [76.7 kg (169 lb 1.6 oz)] 76.7 kg (169 lb 1.6 oz) (07/30 0530)   General appearance: alert, cooperative and no distress Resp: clear to auscultation bilaterally Chest wall: no tenderness, no fluctuance at pacer site.  Cardio: regular rate and rhythm and systolic murmur: holosystolic 3/6, crescendo at 2nd left intercostal space GI: normal findings: bowel sounds normal and soft, non-tender  Lab Results Recent Labs    08/31/17 0342  WBC 8.1  HGB 12.4*  HCT 38.4*  NA 139  K 3.7  CL 106  CO2 25  BUN 17  CREATININE 0.51*   Liver Panel No results for input(s): PROT, ALBUMIN, AST, ALT, ALKPHOS, BILITOT, BILIDIR, IBILI in the last 72 hours. Sedimentation Rate No results for input(s): ESRSEDRATE in the last 72 hours. C-Reactive Protein No results for input(s): CRP in the last 72 hours.  Microbiology: Recent Results (from the past 240 hour(s))  Urine Culture     Status: Abnormal   Collection Time: 08/25/17  2:29 PM  Result Value Ref Range Status   Specimen Description   Final    URINE,  CLEAN CATCH Performed at [facility], [place] [address], [place], [place] [address]    Special Requests   Final    NONE Performed at [facility], [place] [address], [place], [place] [address]    Culture (A)  Final    >=100,000 COLONIES/mL ESCHERICHIA COLI >=100,000 COLONIES/mL KLEBSIELLA OXYTOCA    Report Status 08/28/2017 FINAL  Final   Organism ID, Bacteria ESCHERICHIA COLI (A)  Final   Organism ID, Bacteria KLEBSIELLA OXYTOCA (A)  Final      Susceptibility   Escherichia coli - MIC*    AMPICILLIN <=2 SENSITIVE Sensitive     CEFAZOLIN <=4 SENSITIVE Sensitive     CEFTRIAXONE <=1 SENSITIVE Sensitive     CIPROFLOXACIN <=0.25 SENSITIVE Sensitive     GENTAMICIN <=1 SENSITIVE  Sensitive     IMIPENEM <=0.25 SENSITIVE Sensitive     NITROFURANTOIN <=16 SENSITIVE Sensitive     TRIMETH/SULFA <=20 SENSITIVE Sensitive     AMPICILLIN/SULBACTAM <=2 SENSITIVE Sensitive     PIP/TAZO <=4 SENSITIVE Sensitive     Extended ESBL NEGATIVE Sensitive     * >=100,000 COLONIES/mL ESCHERICHIA COLI   Klebsiella oxytoca - MIC*    AMPICILLIN RESISTANT Resistant     CEFAZOLIN >=64 RESISTANT Resistant     CEFTRIAXONE <=1 SENSITIVE Sensitive     CIPROFLOXACIN <=0.25 SENSITIVE Sensitive     GENTAMICIN <=1 SENSITIVE Sensitive     IMIPENEM <=0.25 SENSITIVE Sensitive     NITROFURANTOIN 32 SENSITIVE Sensitive     TRIMETH/SULFA <=20 SENSITIVE Sensitive     AMPICILLIN/SULBACTAM 4 SENSITIVE Sensitive     PIP/TAZO <=4 SENSITIVE Sensitive     Extended ESBL NEGATIVE Sensitive     * >=100,000 COLONIES/mL KLEBSIELLA OXYTOCA  Blood culture (routine x 2)     Status: Abnormal   Collection Time: 08/25/17  2:47 PM  Result Value Ref Range Status   Specimen Description   Final    BLOOD LEFT ARM Performed at [facility] [address] [place], [place] [address]    Special Requests   Final    BOTTLES DRAWN AEROBIC AND ANAEROBIC Blood Culture adequate volume Performed at [facility] [address] [place], [place] [address]    Culture  Setup Time   Final    AEROBIC BOTTLE ONLY GRAM NEGATIVE RODS CRITICAL RESULT CALLED TO, READ BACK BY AND VERIFIED WITH: A PHAM PHARMD AT 0836 ON [id number] BY SJW Performed at [facility], [place] [address] [place], [place] [address]    Culture KLEBSIELLA OXYTOCA (A)  Final   Report Status 08/31/2017 FINAL  Final   Organism ID, Bacteria KLEBSIELLA OXYTOCA  Final      Susceptibility   Klebsiella oxytoca - MIC*    AMPICILLIN RESISTANT Resistant     CEFAZOLIN >=64 RESISTANT Resistant     CEFEPIME <=1 SENSITIVE Sensitive     CEFTAZIDIME <=1 SENSITIVE Sensitive     CEFTRIAXONE <=1 SENSITIVE Sensitive     CIPROFLOXACIN  <=0.25 SENSITIVE Sensitive     GENTAMICIN <=1 SENSITIVE Sensitive     IMIPENEM <=0.25 SENSITIVE Sensitive     TRIMETH/SULFA <=20 SENSITIVE Sensitive     AMPICILLIN/SULBACTAM 8 SENSITIVE Sensitive     PIP/TAZO <=4 SENSITIVE Sensitive     * KLEBSIELLA OXYTOCA  Blood Culture ID Panel (Reflexed)     Status: Abnormal   Collection Time: 08/25/17  2:47 PM  Result Value Ref Range Status   Enterococcus species NOT DETECTED NOT DETECTED Final  Listeria monocytogenes NOT DETECTED NOT DETECTED Final   Staphylococcus species NOT DETECTED NOT DETECTED Final   Staphylococcus aureus NOT DETECTED NOT DETECTED Final   Streptococcus species NOT DETECTED NOT DETECTED Final   Streptococcus agalactiae NOT DETECTED NOT DETECTED Final   Streptococcus pneumoniae NOT DETECTED NOT DETECTED Final   Streptococcus pyogenes NOT DETECTED NOT DETECTED Final   Acinetobacter baumannii NOT DETECTED NOT DETECTED Final   Enterobacteriaceae species DETECTED (A) NOT DETECTED Final    Comment: Enterobacteriaceae represent a large family of gram-negative bacteria, not a single organism. CRITICAL RESULT CALLED TO, READ BACK BY AND VERIFIED WITH: A PHAM PHARMD AT 0836 ON [id number] BY SJW    Enterobacter cloacae complex NOT DETECTED NOT DETECTED Final   Escherichia coli NOT DETECTED NOT DETECTED Final   Klebsiella oxytoca DETECTED (A) NOT DETECTED Final    Comment: CRITICAL RESULT CALLED TO, READ BACK BY AND VERIFIED WITH: A PHAM PHARMD AT 0836 ON [id number] BY SJW    Klebsiella pneumoniae NOT DETECTED NOT DETECTED Final   Proteus species NOT DETECTED NOT DETECTED Final   Serratia marcescens NOT DETECTED NOT DETECTED Final   Carbapenem resistance NOT DETECTED NOT DETECTED Final   Haemophilus influenzae NOT DETECTED NOT DETECTED Final   Neisseria meningitidis NOT DETECTED NOT DETECTED Final   Pseudomonas aeruginosa NOT DETECTED NOT DETECTED Final   Candida albicans NOT DETECTED NOT DETECTED Final   Candida glabrata NOT DETECTED  NOT DETECTED Final   Candida krusei NOT DETECTED NOT DETECTED Final   Candida parapsilosis NOT DETECTED NOT DETECTED Final   Candida tropicalis NOT DETECTED NOT DETECTED Final    Comment: Performed at [facility], [place] [address] [place], [place] [address]  Blood culture (routine x 2)     Status: None   Collection Time: 08/25/17  2:52 PM  Result Value Ref Range Status   Specimen Description   Final    BLOOD RIGHT ARM Performed at [facility] [address], [place], [place] [address]    Special Requests   Final    BOTTLES DRAWN AEROBIC AND ANAEROBIC Blood Culture adequate volume Performed at [facility] [address] [place], [place] [address]    Culture   Final    NO GROWTH 5 DAYS Performed at [facility], [place] [address] [place], [place] [address]    Report Status 08/30/2017 FINAL  Final  MRSA PCR Screening     Status: Abnormal   Collection Time: 08/25/17  6:18 PM  Result Value Ref Range Status   MRSA by PCR POSITIVE (A) NEGATIVE Final    Comment:        The GeneXpert MRSA Assay (FDA approved for NASAL specimens only), is one component of a comprehensive MRSA colonization surveillance program. It is not intended to diagnose MRSA infection nor to guide or monitor treatment for MRSA infections. RESULT CALLED TO, READ BACK BY AND VERIFIED WITHTod Persia RN [id number] 08/25/17 A NAVARRO Performed at [facility], [place] [address], [place], [place] [address]   Culture, blood (routine x 2)     Status: None (Preliminary result)   Collection Time: 08/31/17  3:42 AM  Result Value Ref Range Status   Specimen Description   Final    BLOOD RIGHT ANTECUBITAL Performed at [facility] [address], [place],  [address]    Special Requests   Final    BOTTLES DRAWN AEROBIC AND ANAEROBIC Blood Culture adequate volume Performed  at [facility], [place] [address],  [place], [place] [address]    Culture   Final    NO GROWTH 1 DAY Performed at [facility], [place] [address] [place], [place] [address]    Report Status PENDING  Incomplete  Culture, blood (routine x 2)     Status: None (Preliminary result)   Collection Time: 08/31/17  3:42 AM  Result Value Ref Range Status   Specimen Description   Final    BLOOD LEFT HAND Performed at [facility] [address], [place], [place] [address]    Special Requests   Final    BOTTLES DRAWN AEROBIC ONLY Blood Culture adequate volume Performed at [facility] [address], [place], [place] [address]    Culture   Final    NO GROWTH 1 DAY Performed at [facility], [place] [address] [place], [place] [address]    Report Status PENDING  Incomplete    Studies/Results: Dg Swallowing Func-speech Pathology  Result Date: 09/01/2017 Objective Swallowing Evaluation: Type of Study: MBS-Modified Barium Swallow Study  Patient Details Name: Adam [name] MRN: [id number] Date of Birth: 1926-08-31 Today's Date: 09/01/2017 Time: SLP Start Time (ACUTE ONLY): 1535 -SLP Stop Time (ACUTE ONLY): 1615 SLP Time Calculation (min) (ACUTE ONLY): 40 min Past Medical History: Past Medical History: Diagnosis Date . Arthritis   "minor, back and sometimes knees" (12/15/2012) . Bradycardia   AFib/SSS s/p St Jude PPM 04/12/2008 . CAD (coronary artery disease) 12/30/2014  CABG (LIMA-LAD, SVG-RCA, SVG-OM in 1996).  07/2009 BMS to SVG-RCA. Cath in 04/2010 with patent stents  . Cardiomyopathy, ischemic 08/25/2012 . CHF (congestive heart failure) ([place])  . Chronic knee pain 12/03/2014 . Combined congestive systolic and diastolic heart failure ([place]) 02/02/2015  Hx EF 41%. BNP 96.8 02/21/15 Torsemide 04/06/15 Na 142, K 4.6, Bun 16, creat 0.89 04/20/14 BNP 111.7, Na 142, K 4.6, Bun 16, creat 0.9  . Depression with anxiety 02/02/2015  02/21/15 Hgb A1c 5.8 03/10/15 MMSE 30/30  . Dizziness, after diuretic asscoiated with  hypotension and responded to fluid bolus 06/05/2011  04/28/15 US carotid R+L normal bilateral arterial velocities.   Marland Kitchen Dyspnea 08/11/2014  Followed in Pulmonary clinic/ [facility]/ [place]  - 08/11/2014  Walked RA x 1 laps @ 185 ft each stopped due to fatigue/off balance/ slow pace/  no sob or desat  - PFT's  09/26/2014  FEV1 2.26 (85 % ) ratio 76  p no % improvement from saba with DLCO  67 % corrects to 93 % for alv volume      Since prev study 08/04/13 minimal change lung vol or dlco   . Embolic cerebral infarction ([place]) 12/06/2015 . Exertional shortness of breath   "sometimes walking" (12/15/2012) . GERD (gastroesophageal reflux disease)  . Gout 02/09/2015 . Heart murmur   "just told I had one today" (12/15/2012) . Hiatal hernia  . Hyperlipidemia  . Hypertension  . Hypothyroid  . Influenza A 03/10/2016 . Insomnia  . Melanoma of back ([place]) 1976 . Myocardial infarction [facility]) 1996; 2011  "both silent" (12/15/2012) . Nonrheumatic aortic valve stenosis  . Orthostatic hypotension  . Osteoporosis, senile  . Pacemaker  . RBBB  . Restless leg 02/02/2015 . Right leg weakness 12/06/2015 . [facility] spotted fever  . S/P CABG x 4  . Sick sinus syndrome ([place]) 01/31/2014 . Sustained ventricular tachycardia ([place]) 07/27/2014 . Urinary retention 12/30/2014 Past Surgical History: Past Surgical History: Procedure Laterality Date .  CARDIAC CATHETERIZATION  04/2010  LIMA to LAD patent,SVG to OM patent,no in-stnet restenosis RCA . CATARACT EXTRACTION W/ INTRAOCULAR LENS  IMPLANT, BILATERAL Bilateral 2012 . CORONARY ANGIOPLASTY WITH STENT PLACEMENT  07/2009  bare metal stent to SVG to the RCA . CORONARY ARTERY BYPASS GRAFT  1996  LIMA to LAD,SVG to RCA & SVG to OM . INSERT / REPLACE / REMOVE PACEMAKER  2010 . MELANOMA EXCISION  05/1974 X2  "taken off my back" (12/15/2012) . NM MYOVIEW LTD  06/2011  low risk . PPM GENERATOR CHANGEOUT N/A 01/22/2017  Procedure: PPM GENERATOR CHANGEOUT;  Surgeon: [name], MD;  Location: [facility];   Service: Cardiovascular;  Laterality: N/A; . TONSILLECTOMY  1938 . TRANSURETHRAL RESECTION OF PROSTATE  1986 . US ECHOCARDIOGRAPHY  07/11/2009  EF 45-68% HPI: 84 year old male admitted 08/25/17 with pain and weakness, UTI. PMH: CAD/CABG, CHF, depression/anxiety, bradycardia, sick sinus syndrome, pacer, CVA, GERD, gout, heart murmer, [place] Spotted Fever, Melanoma, HTN, RLS, urinary retention, MI x2  Subjective: pt awake in bed, son in law and daughter Caryl Asp present Assessment / Plan / Recommendation CHL IP CLINICAL IMPRESSIONS 09/01/2017 Clinical Impression Pt with significantly improved swallow function compared to [facility] on Friday June 26th.  Mild dysphagia present without aspiration/penetration of any consistency tested (thin, nectar, pudding, cracker, barium tablet).  Mild weakness continues with mild residuals with intermittent sensation.  Pt able to clear residuals with cued dry swallow.  In addition, pharyngeal swallow triggered at pyriform sinus with thin = therefore rec pt sit fully upright for all po~  Of note, pt throat clearing during MBS without barium in larynx, he does appear with secretions retained in pharynx.  Will follow up for dysphagia management and RMST to address dysphagia/cough strength.  Family present and pt, and family educated to findings/recommendations during [place]. SLP Visit Diagnosis Dysphagia, oropharyngeal phase (R13.12) Attention and concentration deficit following -- Frontal lobe and executive function deficit following -- Impact on safety and function Mild aspiration risk   CHL IP TREATMENT RECOMMENDATION 09/01/2017 Treatment Recommendations Therapy as outlined in treatment plan below   Prognosis 09/01/2017 Prognosis for Safe Diet Advancement Good Barriers to Reach Goals -- Barriers/Prognosis Comment -- CHL IP DIET RECOMMENDATION 09/01/2017 SLP Diet Recommendations Dysphagia 3 (Mech soft) solids;Thin liquid Liquid Administration via -- Medication Administration Whole meds with puree  Compensations Slow rate;Small sips/bites;Follow solids with liquid Postural Changes Seated upright at 90 degrees;Remain semi-upright after after feeds/meals (Comment)   CHL IP OTHER RECOMMENDATIONS 09/01/2017 Recommended Consults -- Oral Care Recommendations Oral care BID Other Recommendations --   CHL IP FOLLOW UP RECOMMENDATIONS 09/01/2017 Follow up Recommendations Skilled Nursing facility   [facility] IP FREQUENCY AND DURATION 09/01/2017 Speech Therapy Frequency (ACUTE ONLY) min 1 x/week Treatment Duration 1 week      CHL IP ORAL PHASE 09/01/2017 Oral Phase WFL Oral - Pudding Teaspoon -- Oral - Pudding Cup -- Oral - Honey Teaspoon -- Oral - Honey Cup -- Oral - Nectar Teaspoon -- Oral - Nectar Cup WFL Oral - Nectar Straw -- Oral - Thin Teaspoon WFL Oral - Thin Cup WFL Oral - Thin Straw WFL Oral - Puree WFL Oral - Mech Soft WFL Oral - Regular -- Oral - Multi-Consistency -- Oral - Pill Delayed oral transit Oral Phase - Comment --  CHL IP PHARYNGEAL PHASE 09/01/2017 Pharyngeal Phase -- Pharyngeal- Pudding Teaspoon -- Pharyngeal -- Pharyngeal- Pudding Cup -- Pharyngeal -- Pharyngeal- Honey Teaspoon -- Pharyngeal -- Pharyngeal- Honey Cup -- Pharyngeal -- Pharyngeal-  Nectar Teaspoon WFL Pharyngeal -- Pharyngeal- Nectar Cup Reduced tongue base retraction Pharyngeal -- Pharyngeal- Nectar Straw -- Pharyngeal -- Pharyngeal- Thin Teaspoon Delayed swallow initiation-pyriform sinuses;Reduced tongue base retraction Pharyngeal -- Pharyngeal- Thin Cup Delayed swallow initiation-pyriform sinuses;Reduced tongue base retraction Pharyngeal -- Pharyngeal- Thin Straw Delayed swallow initiation-pyriform sinuses;Reduced tongue base retraction Pharyngeal -- Pharyngeal- Puree Reduced tongue base retraction Pharyngeal -- Pharyngeal- Mechanical Soft WFL Pharyngeal -- Pharyngeal- Regular -- Pharyngeal -- Pharyngeal- Multi-consistency -- Pharyngeal -- Pharyngeal- Pill WFL Pharyngeal -- Pharyngeal Comment --  CHL IP CERVICAL ESOPHAGEAL PHASE 09/01/2017  Cervical Esophageal Phase WFL Pudding Teaspoon -- Pudding Cup -- Honey Teaspoon -- Honey Cup -- Nectar Teaspoon -- Nectar Cup -- Nectar Straw -- Thin Teaspoon -- Thin Cup -- Thin Straw -- Puree -- Mechanical Soft -- Regular -- Multi-consistency -- Pill -- Cervical Esophageal Comment -- Macario Golds 09/01/2017, 7:45 PM  Luanna Salk, MS [facility] SLP [phone]               Assessment/Plan: K oxytoca bacteremia Pacemaker 2010 UTI (E coli, K oxytoca) Chronic foley  Total days of antibiotics: 8 ceftriaxone  My appreciation to CV for letting EP know.  Repeat BCx are ngtd x 24 h Continue ceftriaxone, I would aim for 2 weeks of IV ceftriaxone, provided his repeat BCx are (-). Basing this on the presence of prosthesis.  He will need repeat BCx 1 week after completing IV rx.  TTE showed normal valves except Ao being calcific, moderate stenosis. Wires seen but not clearly commented on to have vegetation.   Explained to pt and family.  Will follow peripherally.          Bobby Rumpf MD, FACP Infectious Diseases (pager) [phone] [email or web address] 09/02/2017, 3:09 PM  LOS: 8 days

## 2017-09-02 NOTE — Progress Notes (Signed)
PROGRESS NOTE    Adam [name]  RKY:[id number] DOB: Apr 07, 1926 DOA: 08/25/2017 PCP: Blanchie Serve, MD   Brief Narrative: Adam [name] is a 84 y.o. male with a history of CAD status post CABG, chronic combined CHF, depression, anxiety, status post PPM, CVA, GERD, chronic Foley catheter, restless leg syndrome, chronic hypertension.  Patient presented secondary to generalized body aches and weakness.  He is found to have a likely UTI.  He was started on ceftriaxone and cultures are significant for E. coli and Klebsiella oxytoca.  He has significant neck pain in addition to odynophagia.  Neck pain appears to be musculoskeletal most likely.  GI consulted for odynophagia, which also may be musculoskeletal related. Improvement with steroid treatment, but also with treatment of bacteremia. Concern for pacer seeding, but EP consulted and recommended no Transesophageal Echocardiogram.   Assessment & Plan:   Active Problems:   Cardiomyopathy, ischemic   Pacemaker   Hyperlipidemia   SSS (sick sinus syndrome) (HCC)   Hypothyroidism   GERD (gastroesophageal reflux disease)   Urinary retention   Coronary artery disease involving native coronary artery of native heart without angina pectoris   Expressive aphasia   RLS (restless legs syndrome)   Combined congestive systolic and diastolic heart failure (HCC)   Urinary tract infection, acute   Hx of CABG   Nonrheumatic aortic valve stenosis   History of arterial ischemic stroke   Weak   Decubitus ulcer of sacral region, stage 2   Neurogenic orthostatic hypotension (HCC)   Neurogenic bladder   Generalized weakness   Shoulder pain, bilateral   Bacteremia   Weakness Fall Unknown etiology. Possibly related to UTI. Patient with degenerative neck disease which may also be playing a role. X-ray obtained of bilateral shoulders which was significant for mild arthritis. Per wife, she does not think his weakness is significantly far from  baseline. -PT/OT eval: SNF  UTI Patient with a chronic indwelling catheter. Urine culture significant for E. Coli and klebsiella. Both are sensitive to ceftriaxone. -Continue ceftriaxone  Klebsiella oxytoca bacteremia Secondary to UTI. Complicated by PPM. EP recommending no Transesophageal Echocardiogram or lead extraction -Antibiotics as above -infectious disease recommendations  Odynophagia Dysphagia New problem from admission. ?musculoskeletal in nature, but initially symptoms were severe per patient and was unable to swallow at all. GI without recommendations. SLP recommended dysphagia 3 diet on 7/29. Overall, improving.  Neck pain I think most of this is musculoskeletal. Question possible rheumatologic etiology. Seems to be improving with empiric steroid treatment. -Will discontinue Valium prn -Norco prn -Discontinue morphine -Transition to prednisone 20 mg daily from solu-medrol  Spondylolisthesis of cervical spine Discussed with neurosurgery, Dr. Ronnald Ramp, who recommend outpatient follow-up. -Soft neck collar for comfort as needed  Hypothyroidism TSH normal. -Continue Synthroid  GERD Protonix held secondary to NPO status. No symptoms without treatment. Recommend discontinuing, especially in the setting of aspiration concern.  S/p pacemaker  Restless leg syndrome -Continue Requip  History of CVA Expressive aphasia -Continue aspirin and plavix  Orthostatic hypotension Chronic -Continue midodrine and droxidopa (unable to obtain inpatient)  Hyperlipidemia -Continue pravastatin.  Chronic combined systolic and diastolic heart failure History of CAD s/p CABG No chest pain. EF more reduced with a current EF of 35-40% -Continue aspirin and plavix  Aortic stenosis Chronic. Stable.  Moisture associated skin damage Present on gluteal fold and right buttock crease. Present on admission. Wound care.  Tremor One time episode as an outpatient. No recurrence. In  setting of UTI and bacteremia. Discussed with  neurology. If recurs, will formally consult.   DVT prophylaxis: Heparin subq Code Status:   Code Status: Full Code Family Communication: Daughter at bedside Disposition Plan: Discharge pending improvement of pain   Consultants:   Neurosurgery (telephone)  Gastroenterology  Palliative care medicine  Procedures:   7/30: Transthoracic Echocardiogram Study Conclusions  - Left ventricle: The cavity size was normal. Wall thickness was   increased in a pattern of severe LVH. Systolic function was   moderately reduced. The estimated ejection fraction was in the   range of 35% to 40%. Akinesis of the basalinferior myocardium.   Doppler parameters are consistent with a reversible restrictive   pattern, indicative of decreased left ventricular diastolic   compliance and/or increased left atrial pressure (grade 3   diastolic dysfunction). - Aortic valve: There was moderate stenosis. There was mild   regurgitation. Valve area (VTI): 1.17 cm^2. Valve area (Vmax):   1.22 cm^2. Valve area (Vmean): 1.34 cm^2.  Antimicrobials:  Ceftriaxone    Subjective: Improvement of mobility  Objective: Vitals:   09/01/17 0504 09/01/17 1310 09/01/17 2031 09/02/17 0530  BP: (!) 144/87 135/72 104/64 121/75  Pulse: 68 70 69 71  Resp: 17 16 20 20   Temp: 98.5 F (36.9 C) 98.1 F (36.7 C) 97.9 F (36.6 C) 97.8 F (36.6 C)  TempSrc: Oral Oral Oral Oral  SpO2: 95% 95% 97% 93%  Weight:    76.7 kg (169 lb 1.6 oz)  Height:        Intake/Output Summary (Last 24 hours) at 09/02/2017 1242 Last data filed at 09/02/2017 1044 Gross per 24 hour  Intake 4289.64 ml  Output 1400 ml  Net 2889.64 ml   Filed Weights   08/31/17 0500 09/01/17 0500 09/02/17 0530  Weight: 75.2 kg (165 lb 12.8 oz) 75.3 kg (166 lb 0.1 oz) 76.7 kg (169 lb 1.6 oz)    Examination:  General exam: Appears calm and comfortable Respiratory system: Clear to auscultation. Respiratory  effort normal. Cardiovascular system: S1 & S2 heard, RRR. No murmur. Gastrointestinal system: Abdomen is nondistended, soft and nontender. Normal bowel sounds heard. Central nervous system: Alert and oriented. No focal neurological deficits. Musculoskeletal: Neck range of motion is moderate. Some mild left sided SCM tenderness. Skin: No cyanosis. No rashes Psychiatry: Judgement and insight appear normal. Mood & affect appropriate.    Data Reviewed: I have personally reviewed following labs and imaging studies  CBC: Recent Labs  Lab 08/27/17 0423 08/29/17 1022 08/31/17 0342  WBC 8.9 11.5* 8.1  NEUTROABS 6.5  --   --   HGB 12.1* 13.7 12.4*  HCT 36.9* 40.5 38.4*  MCV 97.1 95.3 97.0  PLT 243 322 299   Basic Metabolic Panel: Recent Labs  Lab 08/27/17 0423 08/29/17 1022 08/31/17 0342  NA 142 140 139  K 3.8 3.8 3.7  CL 108 106 106  CO2 26 25 25   GLUCOSE 109* 113* 120*  BUN 18 15 17   CREATININE 0.57* 0.55* 0.51*  CALCIUM 8.5* 8.8* 8.3*  MG 2.1  --   --    GFR: Estimated Creatinine Clearance: 65.9 mL/min (A) (by C-G formula based on SCr of 0.51 mg/dL (L)). Liver Function Tests: Recent Labs  Lab 08/27/17 0423  AST 22  ALT 17  ALKPHOS 59  BILITOT 0.6  PROT 6.2*  ALBUMIN 2.5*   No results for input(s): LIPASE, AMYLASE in the last 168 hours. No results for input(s): AMMONIA in the last 168 hours. Coagulation Profile: No results for input(s): INR, PROTIME in  the last 168 hours. Cardiac Enzymes: Recent Labs  Lab 08/26/17 1439 08/26/17 2136 08/27/17 0423  CKTOTAL 102  --   --   TROPONINI 0.03* 0.03* 0.03*   BNP (last 3 results) Recent Labs    09/17/16 1517  PROBNP 721.0*   HbA1C: No results for input(s): HGBA1C in the last 72 hours. CBG: Recent Labs  Lab 08/28/17 0808 08/29/17 0728 08/30/17 0740 08/31/17 0747 09/01/17 0739  GLUCAP 110* 124* 112* 94 104*   Lipid Profile: No results for input(s): CHOL, HDL, LDLCALC, TRIG, CHOLHDL, LDLDIRECT in the  last 72 hours. Thyroid Function Tests: No results for input(s): TSH, T4TOTAL, FREET4, T3FREE, THYROIDAB in the last 72 hours. Anemia Panel: No results for input(s): VITAMINB12, FOLATE, FERRITIN, TIBC, IRON, RETICCTPCT in the last 72 hours. Sepsis Labs: Recent Labs  Lab 08/27/17 0423  LATICACIDVEN 0.9    Recent Results (from the past 240 hour(s))  Urine Culture     Status: Abnormal   Collection Time: 08/25/17  2:29 PM  Result Value Ref Range Status   Specimen Description   Final    URINE, CLEAN CATCH Performed at [facility], [place] [address] [place], [place] [address]    Special Requests   Final    NONE Performed at [facility], [place] [address], [place], [place] [address]    Culture (A)  Final    >=100,000 COLONIES/mL ESCHERICHIA COLI >=100,000 COLONIES/mL KLEBSIELLA OXYTOCA    Report Status 08/28/2017 FINAL  Final   Organism ID, Bacteria ESCHERICHIA COLI (A)  Final   Organism ID, Bacteria KLEBSIELLA OXYTOCA (A)  Final      Susceptibility   Escherichia coli - MIC*    AMPICILLIN <=2 SENSITIVE Sensitive     CEFAZOLIN <=4 SENSITIVE Sensitive     CEFTRIAXONE <=1 SENSITIVE Sensitive     CIPROFLOXACIN <=0.25 SENSITIVE Sensitive     GENTAMICIN <=1 SENSITIVE Sensitive     IMIPENEM <=0.25 SENSITIVE Sensitive     NITROFURANTOIN <=16 SENSITIVE Sensitive     TRIMETH/SULFA <=20 SENSITIVE Sensitive     AMPICILLIN/SULBACTAM <=2 SENSITIVE Sensitive     PIP/TAZO <=4 SENSITIVE Sensitive     Extended ESBL NEGATIVE Sensitive     * >=100,000 COLONIES/mL ESCHERICHIA COLI   Klebsiella oxytoca - MIC*    AMPICILLIN RESISTANT Resistant     CEFAZOLIN >=64 RESISTANT Resistant     CEFTRIAXONE <=1 SENSITIVE Sensitive     CIPROFLOXACIN <=0.25 SENSITIVE Sensitive     GENTAMICIN <=1 SENSITIVE Sensitive     IMIPENEM <=0.25 SENSITIVE Sensitive     NITROFURANTOIN 32 SENSITIVE Sensitive     TRIMETH/SULFA <=20 SENSITIVE Sensitive     AMPICILLIN/SULBACTAM 4  SENSITIVE Sensitive     PIP/TAZO <=4 SENSITIVE Sensitive     Extended ESBL NEGATIVE Sensitive     * >=100,000 COLONIES/mL KLEBSIELLA OXYTOCA  Blood culture (routine x 2)     Status: Abnormal   Collection Time: 08/25/17  2:47 PM  Result Value Ref Range Status   Specimen Description   Final    BLOOD LEFT ARM Performed at [facility] [address] [place], [place] [address]    Special Requests   Final    BOTTLES DRAWN AEROBIC AND ANAEROBIC Blood Culture adequate volume Performed at [facility] [address] [place], [place] [address]    Culture  Setup Time   Final    AEROBIC BOTTLE ONLY GRAM NEGATIVE RODS CRITICAL RESULT CALLED TO, READ BACK BY AND VERIFIED  WITH: A PHAM PHARMD AT 0836 ON [id number] BY SJW Performed at [facility], [place] [address] [place], [place] [address]    Culture KLEBSIELLA OXYTOCA (A)  Final   Report Status 08/31/2017 FINAL  Final   Organism ID, Bacteria KLEBSIELLA OXYTOCA  Final      Susceptibility   Klebsiella oxytoca - MIC*    AMPICILLIN RESISTANT Resistant     CEFAZOLIN >=64 RESISTANT Resistant     CEFEPIME <=1 SENSITIVE Sensitive     CEFTAZIDIME <=1 SENSITIVE Sensitive     CEFTRIAXONE <=1 SENSITIVE Sensitive     CIPROFLOXACIN <=0.25 SENSITIVE Sensitive     GENTAMICIN <=1 SENSITIVE Sensitive     IMIPENEM <=0.25 SENSITIVE Sensitive     TRIMETH/SULFA <=20 SENSITIVE Sensitive     AMPICILLIN/SULBACTAM 8 SENSITIVE Sensitive     PIP/TAZO <=4 SENSITIVE Sensitive     * KLEBSIELLA OXYTOCA  Blood Culture ID Panel (Reflexed)     Status: Abnormal   Collection Time: 08/25/17  2:47 PM  Result Value Ref Range Status   Enterococcus species NOT DETECTED NOT DETECTED Final   Listeria monocytogenes NOT DETECTED NOT DETECTED Final   Staphylococcus species NOT DETECTED NOT DETECTED Final   Staphylococcus aureus NOT DETECTED NOT DETECTED Final   Streptococcus species NOT DETECTED NOT DETECTED Final   Streptococcus  agalactiae NOT DETECTED NOT DETECTED Final   Streptococcus pneumoniae NOT DETECTED NOT DETECTED Final   Streptococcus pyogenes NOT DETECTED NOT DETECTED Final   Acinetobacter baumannii NOT DETECTED NOT DETECTED Final   Enterobacteriaceae species DETECTED (A) NOT DETECTED Final    Comment: Enterobacteriaceae represent a large family of gram-negative bacteria, not a single organism. CRITICAL RESULT CALLED TO, READ BACK BY AND VERIFIED WITH: A PHAM PHARMD AT 0836 ON [id number] BY SJW    Enterobacter cloacae complex NOT DETECTED NOT DETECTED Final   Escherichia coli NOT DETECTED NOT DETECTED Final   Klebsiella oxytoca DETECTED (A) NOT DETECTED Final    Comment: CRITICAL RESULT CALLED TO, READ BACK BY AND VERIFIED WITH: A PHAM PHARMD AT 0836 ON [id number] BY SJW    Klebsiella pneumoniae NOT DETECTED NOT DETECTED Final   Proteus species NOT DETECTED NOT DETECTED Final   Serratia marcescens NOT DETECTED NOT DETECTED Final   Carbapenem resistance NOT DETECTED NOT DETECTED Final   Haemophilus influenzae NOT DETECTED NOT DETECTED Final   Neisseria meningitidis NOT DETECTED NOT DETECTED Final   Pseudomonas aeruginosa NOT DETECTED NOT DETECTED Final   Candida albicans NOT DETECTED NOT DETECTED Final   Candida glabrata NOT DETECTED NOT DETECTED Final   Candida krusei NOT DETECTED NOT DETECTED Final   Candida parapsilosis NOT DETECTED NOT DETECTED Final   Candida tropicalis NOT DETECTED NOT DETECTED Final    Comment: Performed at [facility], [place] [address] [place], [place] [address]  Blood culture (routine x 2)     Status: None   Collection Time: 08/25/17  2:52 PM  Result Value Ref Range Status   Specimen Description   Final    BLOOD RIGHT ARM Performed at [facility] [address], [place], [place] [address]    Special Requests   Final    BOTTLES DRAWN AEROBIC AND ANAEROBIC Blood Culture adequate volume Performed at [facility] [address]  [place], [place] [address]    Culture   Final    NO GROWTH 5 DAYS Performed at [facility], [place] [address] [place], [place] [address]  Report Status 08/30/2017 FINAL  Final  MRSA PCR Screening     Status: Abnormal   Collection Time: 08/25/17  6:18 PM  Result Value Ref Range Status   MRSA by PCR POSITIVE (A) NEGATIVE Final    Comment:        The GeneXpert MRSA Assay (FDA approved for NASAL specimens only), is one component of a comprehensive MRSA colonization surveillance program. It is not intended to diagnose MRSA infection nor to guide or monitor treatment for MRSA infections. RESULT CALLED TO, READ BACK BY AND VERIFIED WITHTod Persia RN 0539 08/25/17 A NAVARRO Performed at [facility], [place] [address], [place], [place] [address]   Culture, blood (routine x 2)     Status: None (Preliminary result)   Collection Time: 08/31/17  3:42 AM  Result Value Ref Range Status   Specimen Description   Final    BLOOD RIGHT ANTECUBITAL Performed at [facility] [address] [place], [place] [address]    Special Requests   Final    BOTTLES DRAWN AEROBIC AND ANAEROBIC Blood Culture adequate volume Performed at [facility] [address], [place], [place] [address]    Culture   Final    NO GROWTH 1 DAY Performed at [facility], [place] [address] [place], [place] [address]    Report Status PENDING  Incomplete  Culture, blood (routine x 2)     Status: None (Preliminary result)   Collection Time: 08/31/17  3:42 AM  Result Value Ref Range Status   Specimen Description   Final    BLOOD LEFT HAND Performed at [facility] [address] [place], [place] [address]    Special Requests   Final    BOTTLES DRAWN AEROBIC ONLY Blood Culture adequate volume Performed at [place] [address] [place], [place] [address]    Culture   Final    NO GROWTH 1 DAY Performed at [facility], [place] [address], [place], [place] [address]    Report Status PENDING  Incomplete         Radiology Studies: Dg Swallowing Func-speech Pathology  Result Date: 09/01/2017 Objective Swallowing Evaluation: Type of Study: MBS-Modified Barium Swallow Study  Patient Details Name: EDWIN BAINES MRN: [id number] Date of Birth: [birth year] Today's Date: 09/01/2017 Time: SLP Start Time (ACUTE ONLY): 1535 -SLP Stop Time (ACUTE ONLY): 1615 SLP Time Calculation (min) (ACUTE ONLY): 40 min Past Medical History: Past Medical History: Diagnosis Date . Arthritis   "minor, back and sometimes knees" (12/15/2012) . Bradycardia   AFib/SSS s/p St Jude PPM 04/12/2008 . CAD (coronary artery disease) 12/30/2014  CABG (LIMA-LAD, SVG-RCA, SVG-OM in 1996).  07/2009 BMS to SVG-RCA. Cath in 04/2010 with patent stents  . Cardiomyopathy, ischemic 08/25/2012 . CHF (congestive heart failure) ([place])  . Chronic knee pain 12/03/2014 . Combined congestive systolic and diastolic heart failure ([place]) 02/02/2015  Hx EF 41%. BNP 96.8 02/21/15 [place]sem[place] 04/06/15 Na 142, K 4.6, Bun 16, creat 0.89 04/20/14 BNP 111.7, Na 142, K 4.6, Bun 16, creat 0.9  . Depression with anxiety 02/02/2015  02/21/15 Hgb A1c 5.8 03/10/15 MMSE 30/30  . Dizziness, after diuretic asscoiated with hypotension and responded to fluid bolus 06/05/2011  04/28/15 US carotid R+L normal bilateral arterial velocities.   Marland Kitchen Dyspnea 08/11/2014  Followed in Pulmonary clinic/ [facility]/ [place]  - 08/11/2014  Walked RA x 1 laps @ 185 ft each stopped due to fatigue/off  balance/ slow pace/  no sob or desat  - PFT's  09/26/2014  FEV1 2.26 (85 % ) ratio 76  p no % improvement from saba with DLCO  67 % corrects to 93 % for alv volume      Since prev study 08/04/13 minimal change lung vol or dlco   . Embolic cerebral infarction ([place]) 12/06/2015 . Exertional shortness of breath   "sometimes walking" (12/15/2012) . GERD (gastroesophageal reflux disease)  . Gout 02/09/2015 . Heart murmur   "just told I had  one today" (12/15/2012) . Hiatal hernia  . Hyperlipidemia  . Hypertension  . Hypothyroid  . Influenza A 03/10/2016 . Insomnia  . Melanoma of back ([place]) 1976 . Myocardial infarction [facility]) 1996; 2011  "both silent" (12/15/2012) . Nonrheumatic aortic valve stenosis  . Orthostatic hypotension  . Osteoporosis, senile  . Pacemaker  . RBBB  . Restless leg 02/02/2015 . Right leg weakness 12/06/2015 . [facility] spotted fever  . S/P CABG x 4  . Sick sinus syndrome ([place]) 01/31/2014 . Sustained ventricular tachycardia ([place]) 07/27/2014 . Urinary retention 12/30/2014 Past Surgical History: Past Surgical History: Procedure Laterality Date . CARDIAC CATHETERIZATION  04/2010  LIMA to LAD patent,SVG to OM patent,no in-stnet restenosis RCA . CATARACT EXTRACTION W/ INTRAOCULAR LENS  IMPLANT, BILATERAL Bilateral 2012 . CORONARY ANGIOPLASTY WITH STENT PLACEMENT  07/2009  bare metal stent to SVG to the RCA . CORONARY ARTERY BYPASS GRAFT  1996  LIMA to LAD,SVG to RCA & SVG to OM . INSERT / REPLACE / REMOVE PACEMAKER  2010 . MELANOMA EXCISION  05/1974 X2  "taken off my back" (12/15/2012) . NM MYOVIEW LTD  06/2011  low risk . PPM GENERATOR CHANGEOUT N/A 01/22/2017  Procedure: PPM GENERATOR CHANGEOUT;  Surgeon: [name], MD;  Location: [facility];  Service: Cardiovascular;  Laterality: N/A; . TONSILLECTOMY  1938 . TRANSURETHRAL RESECTION OF PROSTATE  1986 . US ECHOCARDIOGRAPHY  07/11/2009  EF 45-60% HPI: 84 year old male admitted 08/25/17 with pain and weakness, UTI. PMH: CAD/CABG, CHF, depression/anxiety, bradycardia, sick sinus syndrome, pacer, CVA, GERD, gout, heart murmer, [place] Spotted Fever, Melanoma, HTN, RLS, urinary retention, MI x2  Subjective: pt awake in bed, son in law and daughter Caryl Asp present Assessment / Plan / Recommendation CHL IP CLINICAL IMPRESSIONS 09/01/2017 Clinical Impression Pt with significantly improved swallow function compared to [facility] on Friday June 26th.  Mild dysphagia present without  aspiration/penetration of any consistency tested (thin, nectar, pudding, cracker, barium tablet).  Mild weakness continues with mild residuals with intermittent sensation.  Pt able to clear residuals with cued dry swallow.  In addition, pharyngeal swallow triggered at pyriform sinus with thin = therefore rec pt sit fully upright for all po~  Of note, pt throat clearing during MBS without barium in larynx, he does appear with secretions retained in pharynx.  Will follow up for dysphagia management and RMST to address dysphagia/cough strength.  Family present and pt, and family educated to findings/recommendations during [place]. SLP Visit Diagnosis Dysphagia, oropharyngeal phase (R13.12) Attention and concentration deficit following -- Frontal lobe and executive function deficit following -- Impact on safety and function Mild aspiration risk   CHL IP TREATMENT RECOMMENDATION 09/01/2017 Treatment Recommendations Therapy as outlined in treatment plan below   Prognosis 09/01/2017 Prognosis for Safe Diet Advancement Good Barriers to Reach Goals -- Barriers/Prognosis Comment -- CHL IP DIET RECOMMENDATION 09/01/2017 SLP Diet Recommendations Dysphagia 3 (Mech soft) solids;Thin liquid Liquid Administration via -- Medication Administration Whole meds with puree  Compensations Slow rate;Small sips/bites;Follow solids with liquid Postural Changes Seated upright at 90 degrees;Remain semi-upright after after feeds/meals (Comment)   CHL IP OTHER RECOMMENDATIONS 09/01/2017 Recommended Consults -- Oral Care Recommendations Oral care BID Other Recommendations --   CHL IP FOLLOW UP RECOMMENDATIONS 09/01/2017 Follow up Recommendations Skilled Nursing facility   [facility] IP FREQUENCY AND DURATION 09/01/2017 Speech Therapy Frequency (ACUTE ONLY) min 1 x/week Treatment Duration 1 week      CHL IP ORAL PHASE 09/01/2017 Oral Phase WFL Oral - Pudding Teaspoon -- Oral - Pudding Cup -- Oral - Honey Teaspoon -- Oral - Honey Cup -- Oral - Nectar Teaspoon -- Oral  - Nectar Cup WFL Oral - Nectar Straw -- Oral - Thin Teaspoon WFL Oral - Thin Cup WFL Oral - Thin Straw WFL Oral - Puree WFL Oral - Mech Soft WFL Oral - Regular -- Oral - Multi-Consistency -- Oral - Pill Delayed oral transit Oral Phase - Comment --  CHL IP PHARYNGEAL PHASE 09/01/2017 Pharyngeal Phase -- Pharyngeal- Pudding Teaspoon -- Pharyngeal -- Pharyngeal- Pudding Cup -- Pharyngeal -- Pharyngeal- Honey Teaspoon -- Pharyngeal -- Pharyngeal- Honey Cup -- Pharyngeal -- Pharyngeal- Nectar Teaspoon WFL Pharyngeal -- Pharyngeal- Nectar Cup Reduced tongue base retraction Pharyngeal -- Pharyngeal- Nectar Straw -- Pharyngeal -- Pharyngeal- Thin Teaspoon Delayed swallow initiation-pyriform sinuses;Reduced tongue base retraction Pharyngeal -- Pharyngeal- Thin Cup Delayed swallow initiation-pyriform sinuses;Reduced tongue base retraction Pharyngeal -- Pharyngeal- Thin Straw Delayed swallow initiation-pyriform sinuses;Reduced tongue base retraction Pharyngeal -- Pharyngeal- Puree Reduced tongue base retraction Pharyngeal -- Pharyngeal- Mechanical Soft WFL Pharyngeal -- Pharyngeal- Regular -- Pharyngeal -- Pharyngeal- Multi-consistency -- Pharyngeal -- Pharyngeal- Pill WFL Pharyngeal -- Pharyngeal Comment --  CHL IP CERVICAL ESOPHAGEAL PHASE 09/01/2017 Cervical Esophageal Phase WFL Pudding Teaspoon -- Pudding Cup -- Honey Teaspoon -- Honey Cup -- Nectar Teaspoon -- Nectar Cup -- Nectar Straw -- Thin Teaspoon -- Thin Cup -- Thin Straw -- Puree -- Mechanical Soft -- Regular -- Multi-consistency -- Pill -- Cervical Esophageal Comment -- Macario Golds 09/01/2017, 7:45 PM  Luanna Salk, MS [facility] SLP [phone]                  Scheduled Meds: . amiodarone  100 mg Oral Daily  . aspirin EC  81 mg Oral Daily  . clopidogrel  75 mg Oral Daily  . heparin  5,000 Units Subcutaneous Q8H  . levothyroxine  100 mcg Oral QAC breakfast  . methylPREDNISolone (SOLU-MEDROL) injection  40 mg Intravenous Daily  . midodrine  10 mg Oral  TID WC  . pravastatin  40 mg Oral QHS   Continuous Infusions: . cefTRIAXone (ROCEPHIN)  IV Stopped (09/01/17 1454)     LOS: 8 days     Cordelia Poche, MD [facility] 09/02/2017, 12:42 PM Pager: [phone]  If 7PM-7AM, please contact night-coverage [email or web address] 09/02/2017, 12:42 PM

## 2017-09-02 NOTE — Progress Notes (Signed)
CSW following to assist with discharge planning to SNF. Patient is a resident at [facility] SNF and plans to return at dc. Patient not medically stable for dc. CSW updated SNF. CSW will continue to follow and assist with discharge planning.  Abundio Miu, [place] Social Worker [facility] Cell#: [phone]

## 2017-09-02 NOTE — Progress Notes (Signed)
[facility] Infusion Coordinator will follow pt with ID team to support pt for Home Infusion Pharmacy services at [place] if ordered.  If patient discharges after hours, please call [phone].   Adam [name] 09/02/2017, 10:29 AM

## 2017-09-02 NOTE — Progress Notes (Signed)
  Echocardiogram 2D Echocardiogram has been performed.  Adam [name] 09/02/2017, 9:58 AM

## 2017-09-02 NOTE — Progress Notes (Signed)
Pt ambulated with nursing to chair with walker and ate lunch and dinner sitting up in chair, able to feed self. Tol POs and meds without choking for signs of noticeable aspiration. ENC IS. Family at bedside most of the day. Dtg called and updated several times throughout the day. Pt had large inct stool. Foley patent. Will cont to monitor. SRP,RN

## 2017-09-02 NOTE — Progress Notes (Signed)
OT Cancellation Note  Patient Details Name: ERSKINE STEINFELDT MRN: [id number] DOB: 10-Nov-1926   Cancelled Treatment:    Reason Eval/Treat Not Completed: Other (comment)  Pt visiting with pastor- will check back on pt next day per pt request.  Kari Baars, [place]  Payton Mccallum D 09/02/2017, 3:03 PM

## 2017-09-03 LAB — GLUCOSE, CAPILLARY: Glucose-Capillary: 86 mg/dL (ref 70–99)

## 2017-09-03 MED ORDER — SODIUM CHLORIDE 0.9 % IV SOLN
2.0000 g | Freq: Every day | INTRAVENOUS | Status: DC
  Administered 2017-09-03 – 2017-09-04 (×2): 2 g via INTRAVENOUS
  Filled 2017-09-03 (×2): qty 2

## 2017-09-03 NOTE — Progress Notes (Signed)
PROGRESS NOTE    NIAL HAWE  SWN:[id number] DOB: Dec 11, 1926 DOA: 08/25/2017 PCP: Blanchie Serve, MD   Brief Narrative: Adam [name] is a 84 y.o. male with a history of CAD status post CABG, chronic combined CHF, depression, anxiety, status post PPM, CVA, GERD, chronic Foley catheter, restless leg syndrome, chronic hypertension.  Patient presented secondary to generalized body aches and weakness.  He is found to have a likely UTI.  He was started on ceftriaxone and cultures are significant for E. coli and Klebsiella oxytoca.  He has significant neck pain in addition to odynophagia.  Neck pain appears to be musculoskeletal most likely.  GI consulted for odynophagia, which also may be musculoskeletal related. Improvement with steroid treatment, but also with treatment of bacteremia. Concern for pacer seeding, but EP consulted and recommended no Transesophageal Echocardiogram.   Assessment & Plan:   Active Problems:   Cardiomyopathy, ischemic   Pacemaker   Hyperlipidemia   SSS (sick sinus syndrome) (HCC)   Hypothyroidism   GERD (gastroesophageal reflux disease)   Urinary retention   Coronary artery disease involving native coronary artery of native heart without angina pectoris   Expressive aphasia   RLS (restless legs syndrome)   Combined congestive systolic and diastolic heart failure (HCC)   Urinary tract infection, acute   Hx of CABG   Nonrheumatic aortic valve stenosis   History of arterial ischemic stroke   Weak   Decubitus ulcer of sacral region, stage 2   Neurogenic orthostatic hypotension (HCC)   Neurogenic bladder   Generalized weakness   Shoulder pain, bilateral   Bacteremia   Weakness Fall Unknown etiology. Possibly related to UTI. Patient with degenerative neck disease which may also be playing a role. X-ray obtained of bilateral shoulders which was significant for mild arthritis. Per wife, she does not think his weakness is significantly far from  baseline. -PT/OT eval: SNF  UTI Patient with a chronic indwelling catheter. Urine culture significant for E. Coli and klebsiella. Both are sensitive to ceftriaxone. -Continue ceftriaxone, today's day 10, 4 more days of IV antibiotic will be needed.  Klebsiella oxytoca bacteremia Secondary to UTI. Complicated by PPM. EP recommending no Transesophageal Echocardiogram or lead extraction -Antibiotics as above -infectious disease recommendations  Odynophagia Dysphagia New problem from admission. ?musculoskeletal in nature, but initially symptoms were severe per patient and was unable to swallow at all. GI without recommendations. SLP recommended dysphagia 3 diet on 7/29. Overall, improving.  Neck pain I think most of this is musculoskeletal. Question possible rheumatologic etiology. Seems to be improving with empiric steroid treatment. -Will discontinue Valium prn -Norco prn -Discontinue morphine -Transition to prednisone 20 mg daily from solu-medrol taper quickly  Spondylolisthesis of cervical spine Discussed with neurosurgery, Dr. Ronnald Ramp, who recommend outpatient follow-up. -Soft neck collar for comfort as needed  Hypothyroidism TSH normal. -Continue Synthroid  GERD Protonix held secondary to NPO status. No symptoms without treatment. Recommend discontinuing, especially in the setting of aspiration concern.  S/p pacemaker  Restless leg syndrome -Continue Requip  History of CVA Expressive aphasia -Continue aspirin and plavix  Orthostatic hypotension Chronic -Continue midodrine and droxidopa (unable to obtain inpatient)  Hyperlipidemia -Continue pravastatin.  Chronic combined systolic and diastolic heart failure History of CAD s/p CABG No chest pain. EF more reduced with a current EF of 35-40% -Continue aspirin and plavix  Aortic stenosis Chronic. Stable.  Moisture associated skin damage Present on gluteal fold and right buttock crease. Present on admission. Wound  care.  Tremor One time  episode as an outpatient. No recurrence. In setting of UTI and bacteremia. Discussed with neurology. If recurs, will formally consult.   DVT prophylaxis: Heparin subq Code Status:   Code Status: Full Code Family Communication: Daughter at bedside Disposition Plan: Discharge pending improvement of pain   Consultants:   Neurosurgery (telephone)  Gastroenterology  Palliative care medicine  Procedures:   7/30: Transthoracic Echocardiogram Study Conclusions  - Left ventricle: The cavity size was normal. Wall thickness was   increased in a pattern of severe LVH. Systolic function was   moderately reduced. The estimated ejection fraction was in the   range of 35% to 40%. Akinesis of the basalinferior myocardium.   Doppler parameters are consistent with a reversible restrictive   pattern, indicative of decreased left ventricular diastolic   compliance and/or increased left atrial pressure (grade 3   diastolic dysfunction). - Aortic valve: There was moderate stenosis. There was mild   regurgitation. Valve area (VTI): 1.17 cm^2. Valve area (Vmax):   1.22 cm^2. Valve area (Vmean): 1.34 cm^2.  Antimicrobials:  Ceftriaxone    Subjective: No acute complaint no nausea no vomiting no fever no chills.  Tolerating antibiotics well.  Neck pain is getting better.  Objective: Vitals:   09/03/17 0502 09/03/17 0949 09/03/17 1146 09/03/17 1409  BP: 132/69 (!) 118/58  120/67  Pulse: 70 70  71  Resp: 18   18  Temp: 98.7 F (37.1 C) 97.9 F (36.6 C)  97.7 F (36.5 C)  TempSrc: Oral Oral  Oral  SpO2: 90%  95% 93%  Weight: 76.8 kg (169 lb 4.8 oz)     Height:        Intake/Output Summary (Last 24 hours) at 09/03/2017 1819 Last data filed at 09/03/2017 1519 Gross per 24 hour  Intake 680 ml  Output 1875 ml  Net -1195 ml   Filed Weights   09/01/17 0500 09/02/17 0530 09/03/17 0502  Weight: 75.3 kg (166 lb 0.1 oz) 76.7 kg (169 lb 1.6 oz) 76.8 kg (169 lb 4.8  oz)    Examination:  General exam: Appears calm and comfortable Respiratory system: Clear to auscultation. Respiratory effort normal. Cardiovascular system: S1 & S2 heard, RRR. No murmur. Gastrointestinal system: Abdomen is nondistended, soft and nontender. Normal bowel sounds heard. Central nervous system: Alert and oriented. No focal neurological deficits. Musculoskeletal: Neck range of motion is moderate. Some mild left sided SCM tenderness. Skin: No cyanosis. No rashes Psychiatry: Judgement and insight appear normal. Mood & affect appropriate.    Data Reviewed: I have personally reviewed following labs and imaging studies  CBC: Recent Labs  Lab 08/29/17 1022 08/31/17 0342  WBC 11.5* 8.1  HGB 13.7 12.4*  HCT 40.5 38.4*  MCV 95.3 97.0  PLT 322 185   Basic Metabolic Panel: Recent Labs  Lab 08/29/17 1022 08/31/17 0342  NA 140 139  K 3.8 3.7  CL 106 106  CO2 25 25  GLUCOSE 113* 120*  BUN 15 17  CREATININE 0.55* 0.51*  CALCIUM 8.8* 8.3*   GFR: Estimated Creatinine Clearance: 65.9 mL/min (A) (by C-G formula based on SCr of 0.51 mg/dL (L)). Liver Function Tests: No results for input(s): AST, ALT, ALKPHOS, BILITOT, PROT, ALBUMIN in the last 168 hours. No results for input(s): LIPASE, AMYLASE in the last 168 hours. No results for input(s): AMMONIA in the last 168 hours. Coagulation Profile: No results for input(s): INR, PROTIME in the last 168 hours. Cardiac Enzymes: No results for input(s): CKTOTAL, CKMB, CKMBINDEX, TROPONINI in the last  168 hours. BNP (last 3 results) Recent Labs    09/17/16 1517  PROBNP 721.0*   HbA1C: No results for input(s): HGBA1C in the last 72 hours. CBG: Recent Labs  Lab 08/29/17 0728 08/30/17 0740 08/31/17 0747 09/01/17 0739 09/03/17 0743  GLUCAP 124* 112* 94 104* 86   Lipid Profile: No results for input(s): CHOL, HDL, LDLCALC, TRIG, CHOLHDL, LDLDIRECT in the last 72 hours. Thyroid Function Tests: No results for input(s):  TSH, T4TOTAL, FREET4, T3FREE, THYROIDAB in the last 72 hours. Anemia Panel: No results for input(s): VITAMINB12, FOLATE, FERRITIN, TIBC, IRON, RETICCTPCT in the last 72 hours. Sepsis Labs: No results for input(s): PROCALCITON, LATICACIDVEN in the last 168 hours.  Recent Results (from the past 240 hour(s))  Urine Culture     Status: Abnormal   Collection Time: 08/25/17  2:29 PM  Result Value Ref Range Status   Specimen Description   Final    URINE, CLEAN CATCH Performed at [facility] [address], [place], [place] [address]    Special Requests   Final    NONE Performed at [facility], [place] [address], [place], [place] [address]    Culture (A)  Final    >=100,000 COLONIES/mL ESCHERICHIA COLI >=100,000 COLONIES/mL KLEBSIELLA OXYTOCA    Report Status 08/28/2017 FINAL  Final   Organism ID, Bacteria ESCHERICHIA COLI (A)  Final   Organism ID, Bacteria KLEBSIELLA OXYTOCA (A)  Final      Susceptibility   Escherichia coli - MIC*    AMPICILLIN <=2 SENSITIVE Sensitive     CEFAZOLIN <=4 SENSITIVE Sensitive     CEFTRIAXONE <=1 SENSITIVE Sensitive     CIPROFLOXACIN <=0.25 SENSITIVE Sensitive     GENTAMICIN <=1 SENSITIVE Sensitive     IMIPENEM <=0.25 SENSITIVE Sensitive     NITROFURANTOIN <=16 SENSITIVE Sensitive     TRIMETH/SULFA <=20 SENSITIVE Sensitive     AMPICILLIN/SULBACTAM <=2 SENSITIVE Sensitive     PIP/TAZO <=4 SENSITIVE Sensitive     Extended ESBL NEGATIVE Sensitive     * >=100,000 COLONIES/mL ESCHERICHIA COLI   Klebsiella oxytoca - MIC*    AMPICILLIN RESISTANT Resistant     CEFAZOLIN >=64 RESISTANT Resistant     CEFTRIAXONE <=1 SENSITIVE Sensitive     CIPROFLOXACIN <=0.25 SENSITIVE Sensitive     GENTAMICIN <=1 SENSITIVE Sensitive     IMIPENEM <=0.25 SENSITIVE Sensitive     NITROFURANTOIN 32 SENSITIVE Sensitive     TRIMETH/SULFA <=20 SENSITIVE Sensitive     AMPICILLIN/SULBACTAM 4 SENSITIVE Sensitive     PIP/TAZO <=4 SENSITIVE  Sensitive     Extended ESBL NEGATIVE Sensitive     * >=100,000 COLONIES/mL KLEBSIELLA OXYTOCA  Blood culture (routine x 2)     Status: Abnormal   Collection Time: 08/25/17  2:47 PM  Result Value Ref Range Status   Specimen Description   Final    BLOOD LEFT ARM Performed at [facility] [address], [place], [place] [address]    Special Requests   Final    BOTTLES DRAWN AEROBIC AND ANAEROBIC Blood Culture adequate volume Performed at [facility] [address] [place], [place] [address]    Culture  Setup Time   Final    AEROBIC BOTTLE ONLY GRAM NEGATIVE RODS CRITICAL RESULT CALLED TO, READ BACK BY AND VERIFIED WITH: A PHAM PHARMD AT 0836 ON [id number] BY SJW Performed at [facility], [place] [address], [place],  [address]    Culture KLEBSIELLA OXYTOCA (A)  Final   Report Status 08/31/2017 FINAL  Final   Organism ID, Bacteria KLEBSIELLA OXYTOCA  Final      Susceptibility   Klebsiella oxytoca - MIC*    AMPICILLIN RESISTANT Resistant     CEFAZOLIN >=64 RESISTANT Resistant     CEFEPIME <=1 SENSITIVE Sensitive     CEFTAZIDIME <=1 SENSITIVE Sensitive     CEFTRIAXONE <=1 SENSITIVE Sensitive     CIPROFLOXACIN <=0.25 SENSITIVE Sensitive     GENTAMICIN <=1 SENSITIVE Sensitive     IMIPENEM <=0.25 SENSITIVE Sensitive     TRIMETH/SULFA <=20 SENSITIVE Sensitive     AMPICILLIN/SULBACTAM 8 SENSITIVE Sensitive     PIP/TAZO <=4 SENSITIVE Sensitive     * KLEBSIELLA OXYTOCA  Blood Culture ID Panel (Reflexed)     Status: Abnormal   Collection Time: 08/25/17  2:47 PM  Result Value Ref Range Status   Enterococcus species NOT DETECTED NOT DETECTED Final   Listeria monocytogenes NOT DETECTED NOT DETECTED Final   Staphylococcus species NOT DETECTED NOT DETECTED Final   Staphylococcus aureus NOT DETECTED NOT DETECTED Final   Streptococcus species NOT DETECTED NOT DETECTED Final   Streptococcus agalactiae NOT DETECTED NOT DETECTED Final    Streptococcus pneumoniae NOT DETECTED NOT DETECTED Final   Streptococcus pyogenes NOT DETECTED NOT DETECTED Final   Acinetobacter baumannii NOT DETECTED NOT DETECTED Final   Enterobacteriaceae species DETECTED (A) NOT DETECTED Final    Comment: Enterobacteriaceae represent a large family of gram-negative bacteria, not a single organism. CRITICAL RESULT CALLED TO, READ BACK BY AND VERIFIED WITH: A PHAM PHARMD AT 0836 ON [id number] BY SJW    Enterobacter cloacae complex NOT DETECTED NOT DETECTED Final   Escherichia coli NOT DETECTED NOT DETECTED Final   Klebsiella oxytoca DETECTED (A) NOT DETECTED Final    Comment: CRITICAL RESULT CALLED TO, READ BACK BY AND VERIFIED WITH: A PHAM PHARMD AT 0836 ON [id number] BY SJW    Klebsiella pneumoniae NOT DETECTED NOT DETECTED Final   Proteus species NOT DETECTED NOT DETECTED Final   Serratia marcescens NOT DETECTED NOT DETECTED Final   Carbapenem resistance NOT DETECTED NOT DETECTED Final   Haemophilus influenzae NOT DETECTED NOT DETECTED Final   Neisseria meningitidis NOT DETECTED NOT DETECTED Final   Pseudomonas aeruginosa NOT DETECTED NOT DETECTED Final   Candida albicans NOT DETECTED NOT DETECTED Final   Candida glabrata NOT DETECTED NOT DETECTED Final   Candida krusei NOT DETECTED NOT DETECTED Final   Candida parapsilosis NOT DETECTED NOT DETECTED Final   Candida tropicalis NOT DETECTED NOT DETECTED Final    Comment: Performed at [facility], [place] [address], [place], [place] [address]  Blood culture (routine x 2)     Status: None   Collection Time: 08/25/17  2:52 PM  Result Value Ref Range Status   Specimen Description   Final    BLOOD RIGHT ARM Performed at [facility] [address] [place], [place] [address]    Special Requests   Final    BOTTLES DRAWN AEROBIC AND ANAEROBIC Blood Culture adequate volume Performed at [facility] [address], [place], [place] [address]    Culture   Final     NO GROWTH 5 DAYS Performed at [facility], [place] [address], [place], [place] [address]    Report Status 08/30/2017 FINAL  Final  MRSA PCR Screening     Status: Abnormal   Collection Time: 08/25/17  6:18 PM  Result Value Ref Range Status  MRSA by PCR POSITIVE (A) NEGATIVE Final    Comment:        The GeneXpert MRSA Assay (FDA approved for NASAL specimens only), is one component of a comprehensive MRSA colonization surveillance program. It is not intended to diagnose MRSA infection nor to guide or monitor treatment for MRSA infections. RESULT CALLED TO, READ BACK BY AND VERIFIED WITHTod Persia RN [id number] 08/25/17 A NAVARRO Performed at [facility], [place] [address], [place], [place] [address]   Culture, blood (routine x 2)     Status: None (Preliminary result)   Collection Time: 08/31/17  3:42 AM  Result Value Ref Range Status   Specimen Description   Final    BLOOD RIGHT ANTECUBITAL Performed at [facility] [address] [place], [place] [address]    Special Requests   Final    BOTTLES DRAWN AEROBIC AND ANAEROBIC Blood Culture adequate volume Performed at [facility] [address] [place], [place] [address]    Culture   Final    NO GROWTH 3 DAYS Performed at [facility], [place] [address] [place], [place] [address]    Report Status PENDING  Incomplete  Culture, blood (routine x 2)     Status: None (Preliminary result)   Collection Time: 08/31/17  3:42 AM  Result Value Ref Range Status   Specimen Description   Final    BLOOD LEFT HAND Performed at [facility] [address], [place], [place] [address]    Special Requests   Final    BOTTLES DRAWN AEROBIC ONLY Blood Culture adequate volume Performed at [facility] [address], [place], [place] [address]    Culture   Final    NO GROWTH 3 DAYS Performed at [facility], [place] [address]  [place], [place] [address]    Report Status PENDING  Incomplete         Radiology Studies: No results found.      Scheduled Meds: . amiodarone  100 mg Oral Daily  . aspirin EC  81 mg Oral Daily  . clopidogrel  75 mg Oral Daily  . heparin  5,000 Units Subcutaneous Q8H  . levothyroxine  100 mcg Oral QAC breakfast  . midodrine  10 mg Oral TID WC  . pravastatin  40 mg Oral QHS  . predniSONE  20 mg Oral Q breakfast   Continuous Infusions: . sodium chloride Stopped (09/02/17 1458)  . cefTRIAXone (ROCEPHIN)  IV 2 g (09/03/17 1444)     LOS: 9 days     Berle Mull, MD [facility] 09/03/2017, 6:19 PM  If 7PM-7AM, please contact night-coverage [email or web address] 09/03/2017, 6:19 PM

## 2017-09-03 NOTE — Progress Notes (Signed)
Occupational Therapy Treatment Patient Details Name: Adam [name] MRN: [id number] DOB: [birth year] Today's Date: 09/03/2017    History of present illness 84yo male with chief complaints of weakness and genearlized pain. Diagnosed with acute UTI; shoulders negative for fracture B, however does have cervical spondylolisthesis and neurosurgical consult is pending. PMH OA, pacemaker, CABG, CHF, depression/anxiety, hx dyspnea, gout, silent MI, aortical valve stenosis, orthostatic hypotension, RLS, [place] spotted fever, sick sinus syndrome, hx CVA with expressive aphasia    OT comments  Pt very motivated.  Needed max +2 to stand from recliner; had been sitting up for several hours. Worked on LUE strengthening.  Follow Up Recommendations  SNF    Equipment Recommendations  None recommended by OT    Recommendations for Other Services      Precautions / Restrictions Precautions Precautions: Cervical;ICD/Pacemaker;Fall Precaution Booklet Issued: No Precaution Comments: cervical spondyoloisthesis- has a soft collar/applied for OOB activity but removed when back to bed Restrictions Weight Bearing Restrictions: No       Mobility Bed Mobility Overal bed mobility: Needs Assistance Bed Mobility: Supine to Sit     Supine to sit: Max assist Sit to supine: Max assist;+2 for physical assistance   General bed mobility comments: increased time and use of bed pad to complete scooting to EOB  Transfers Overall transfer level: Needs assistance Equipment used: Rolling walker (2 wheeled) Transfers: Sit to/from Stand Sit to Stand: Mod assist;Max assist;+2 physical assistance         General transfer comment: from recliner. Pt had been up for several hours. Slow transition sit to stand wtih posterior bias    Balance Overall balance assessment: History of Falls                                         ADL either performed or assessed with clinical judgement   ADL    Eating/Feeding: Set up;Sitting(per family)                                     General ADL Comments: simulated toilet transfer:  max +2 for sit to stand with slow transition.  Min +2 safety for pivotal steps     Vision       Perception     Praxis      Cognition Arousal/Alertness: Awake/alert Behavior During Therapy: WFL for tasks assessed/performed Overall Cognitive Status: Within Functional Limits for tasks assessed                                 General Comments: pleasant and motivated         Exercises Exercises: (AAROM FF x 10 LUE)   Shoulder Instructions       General Comments      Pertinent Vitals/ Pain       Pain Assessment: Faces Faces Pain Scale: Hurts a little bit Pain Location: back of neck Pain Descriptors / Indicators: Constant Pain Intervention(s): Limited activity within patient's tolerance;Monitored during session;Repositioned  Home Living                                          Prior Functioning/Environment  Frequency  Min 2X/week        Progress Toward Goals  OT Goals(current goals can now be found in the care plan section)  Progress towards OT goals: (goals set today)  ADL Goals Pt Will Perform Grooming: with min assist;sitting Pt Will Transfer to Toilet: with min assist;with +2 assist;bedside commode;stand pivot transfer Additional ADL Goal #1: pt will go from sit to stand from high bed with min +2 assistance and maintain for 2 minutes with min A for adls  Plan      Co-evaluation                 AM-PAC PT "6 Clicks" Daily Activity     Outcome Measure   Help from another person eating meals?: A Little Help from another person taking care of personal grooming?: A Lot Help from another person toileting, which includes using toliet, bedpan, or urinal?: A Lot Help from another person bathing (including washing, rinsing, drying)?: A Lot Help from another  person to put on and taking off regular upper body clothing?: A Lot Help from another person to put on and taking off regular lower body clothing?: Total 6 Click Score: 12    End of Session    OT Visit Diagnosis: Unsteadiness on feet (R26.81);Muscle weakness (generalized) (M62.81);History of falling (Z91.81);Other abnormalities of gait and mobility (R26.89);Repeated falls (R29.6);Feeding difficulties (R63.3)   Activity Tolerance Patient tolerated treatment well   Patient Left in bed;with call bell/phone within reach;with family/visitor present   Nurse Communication          Time: 8250-5397 OT Time Calculation (min): 21 min  Charges: OT General Charges $OT Visit: 1 Visit OT Treatments $Therapeutic Activity: 8-22 mins  Adam [name], OTR/L [phone] 09/03/2017   Adam [name] 09/03/2017, 4:10 PM

## 2017-09-03 NOTE — Progress Notes (Signed)
  Speech Language Pathology Treatment: Dysphagia  Patient Details Name: Adam [name] MRN: [id number] DOB: [birth year] Today's Date: 09/03/2017 Time: 9892-1194 SLP Time Calculation (min) (ACUTE ONLY): 18 min  Assessment / Plan / Recommendation Clinical Impression  Pt sitting upright in bed with meal at bedside.  He reports too much food present but states he enjoyed what he consumed.  Voice noted to be mildly gurgly without pt awareness.  Cues to clear throat effectively cleared vocal quality though pt states he can't do it independently.  Reviewed secretion aspiration and mitigation strategies.  Observed pt consuming gingerale without deficits.    Provided with pt RMST to strengthen cough/submental swallow musculature.  SLP set respironics trainers set at 14cm H20 for expiratory device and 9 cm H20 for incentive device after multiple trials to establish exercise range.  Pt requiring max cues with incentive device and moderate verbal/visual cues to perform expiratory device adequately. Use of nasal clip helpful for pt especially with incentive device.  Explained clinical reasoning using teach back and need for pt to tax muscles for improved strength.    Please order SLP follow up at [facility] for dysphagia tx with RMST (Resp muscle strength training) and dysphagia management.  Pt agreeable to plan.  Thanks for letting me assist with this pt's care.  Marland Kitchen   HPI HPI: 84 year old male admitted 08/25/17 with pain and weakness, UTI. PMH: CAD/CABG, CHF, depression/anxiety, bradycardia, sick sinus syndrome, pacer, CVA, GERD, gout, heart murmer, [place] Spotted Fever, Melanoma, HTN, RLS, urinary retention, MI x2      SLP Plan  Continue with current plan of care       Recommendations  Diet recommendations: Dysphagia 3 (mechanical soft);Thin liquid Liquids provided via: Straw Medication Administration: Crushed with puree Supervision: Patient able to self feed Compensations: Slow rate;Small  sips/bites;Other (Comment)(start meals with liquids, intermittent dry swallow, clear throat if voice is gurgly) Postural Changes and/or Swallow Maneuvers: Seated upright 90 degrees;Upright 30-60 min after meal                Oral Care Recommendations: Oral care BID SLP Visit Diagnosis: Dysphagia, oropharyngeal phase (R13.12) Plan: Continue with current plan of care       GO               Luanna Salk, [facility] SLP [phone]  Macario Golds 09/03/2017, 1:01 PM

## 2017-09-03 NOTE — NC FL2 (Signed)
[place] LEVEL OF CARE SCREENING TOOL     IDENTIFICATION  Patient Name: Adam [name] Birthdate: June 01, 1926 Sex: male Admission Date (Current Location): 08/25/2017  [facility] and [place] Number:  Herbalist and Address:  [facility],  [address], [place]      Provider Number: [id number]  Attending Physician Name and Address:  Lavina Hamman, MD  Relative Name and Phone Number:       Current Level of Care: Hospital Recommended Level of Care: [place] Prior Approval Number:    Date Approved/Denied:   PASRR Number: [id number]  Discharge Plan: SNF    Current Diagnoses: Patient Active Problem List   Diagnosis Date Noted  . Bacteremia   . Generalized weakness 08/25/2017  . Shoulder pain, bilateral 08/25/2017  . Neurogenic bladder 07/21/2017  . Mild cognitive impairment 07/21/2017  . Neurogenic orthostatic hypotension ([place]) 04/21/2017  . Decubitus ulcer of sacral region, stage 2 06/11/2016  . Osteoarthritis 04/23/2016  . Gingivitis 04/15/2016  . Weak 03/14/2016  . AKI (acute kidney injury) ([place]) 03/11/2016  . History of arterial ischemic stroke 03/05/2016  . Rash and nonspecific skin eruption 02/27/2016  . Nonrheumatic aortic valve stenosis   . TIA (transient ischemic attack) 01/10/2016  . Hx of CABG 01/10/2016  . Urinary tract infection, acute 04/06/2015  . Insomnia 03/23/2015  . Gout 02/09/2015  . Depression with anxiety 02/02/2015  . RLS (restless legs syndrome) 02/02/2015  . Combined congestive systolic and diastolic heart failure ([place]) 02/02/2015  . Urinary frequency 02/02/2015  . Pressure ulcer 01/01/2015  . Urinary retention 12/30/2014  . Coronary artery disease involving native coronary artery of native heart without angina pectoris 12/30/2014  . Expressive aphasia 12/30/2014  . Chronic constipation   . Chronic knee pain 12/03/2014  . GERD (gastroesophageal reflux disease)   . Dyspnea  on exertion 08/11/2014  . Hypothyroidism 08/11/2014  . VT (ventricular tachycardia) ([place]) 07/27/2014  . Upper airway cough syndrome 07/14/2014  . SSS (sick sinus syndrome) ([place]) 01/31/2014  . Hyperlipidemia 01/30/2014  . Cardiomyopathy, ischemic 08/25/2012  . Pacemaker 08/25/2012  . Dizzy 06/05/2011  . Bradycardia     Orientation RESPIRATION BLADDER Height & Weight     Self, Time, Situation, Place  Normal Indwelling catheter Weight: 169 lb 4.8 oz (76.8 kg) Height:  5\' 10"  (177.8 cm)  BEHAVIORAL SYMPTOMS/MOOD NEUROLOGICAL BOWEL NUTRITION STATUS      Incontinent Diet(DYS 3)  AMBULATORY STATUS COMMUNICATION OF NEEDS Skin   Extensive Assist Verbally Normal                       Personal Care Assistance Level of Assistance  Bathing, Feeding, Dressing Bathing Assistance: Maximum assistance Feeding assistance: Independent Dressing Assistance: Maximum assistance     Functional Limitations Info  Sight, Hearing, Speech Sight Info: Adequate Hearing Info: Adequate Speech Info: Adequate    SPECIAL CARE FACTORS FREQUENCY  PT (By licensed PT), OT (By licensed OT), Speech therapy     PT Frequency: 5x/week OT Frequency: 5x/week     Speech Therapy Frequency: 5x/week      Contractures      Additional Factors Info  Code Status, Allergies Code Status Info: Full Code Allergies Info: Crestor Rosuvastatin Calcium;Penicillins;Sulfa Antibiotics;Altace Ramipril           Current Medications (09/03/2017):  This is the current hospital active medication list Current Facility-Administered Medications  Medication Dose Route Frequency Provider Last Rate Last Dose  .  0.9 %  sodium chloride infusion   Intravenous PRN Mariel Aloe, MD   Stopped at 09/02/17 1458  . acetaminophen (TYLENOL) suppository 650 mg  650 mg Rectal Q6H PRN Mariel Aloe, MD      . amiodarone (PACERONE) tablet 100 mg  100 mg Oral Daily Mariel Aloe, MD   100 mg at 09/03/17 6440  . aspirin EC tablet 81 mg   81 mg Oral Daily Mariel Aloe, MD   81 mg at 09/03/17 3474  . bisacodyl (DULCOLAX) suppository 10 mg  10 mg Rectal Daily PRN Mariel Aloe, MD      . cefTRIAXone (ROCEPHIN) 2 g in sodium chloride 0.9 % 100 mL IVPB  2 g Intravenous Daily Eudelia Bunch, RPH      . clopidogrel (PLAVIX) tablet 75 mg  75 mg Oral Daily Mariel Aloe, MD   75 mg at 09/03/17 2595  . guaiFENesin (ROBITUSSIN) 100 MG/5ML solution 100 mg  5 mL Oral Q4H PRN Lavina Hamman, MD   100 mg at 08/26/17 2344  . heparin injection 5,000 Units  5,000 Units Subcutaneous [address] [place], [place]   5,000 Units at 09/03/17 [phone]  . HYDROcodone-acetaminophen (NORCO/VICODIN) 5-325 MG per tablet 1 tablet  1 tablet Oral Q4H PRN Lavina Hamman, MD   1 tablet at 09/01/17 2248  . levothyroxine (SYNTHROID, LEVOTHROID) tablet 100 mcg  100 mcg Oral QAC breakfast Mariel Aloe, MD   100 mcg at 09/03/17 5643  . magic mouthwash w/lidocaine  15 mL Oral QID PRN Schorr, Rhetta Mura, NP   15 mL at 08/26/17 2151  . meclizine (ANTIVERT) tablet 25 mg  25 mg Oral BID PRN Lovey Newcomer T, NP   25 mg at 09/01/17 2024  . midodrine (PROAMATINE) tablet 10 mg  10 mg Oral TID WC Mariel Aloe, MD   10 mg at 09/03/17 3295  . ondansetron (ZOFRAN) tablet 4 mg  4 mg Oral Q6H PRN Raiford Noble Latif, DO       Or  . ondansetron [facility]) injection 4 mg  4 mg Intravenous Q6H PRN Sheikh, Omair Latif, DO      . phenol (CHLORASEPTIC) mouth spray 1 spray  1 spray Mouth/Throat PRN Sheikh, Omair Latif, DO      . pravastatin (PRAVACHOL) tablet 40 mg  40 mg Oral QHS Mariel Aloe, MD   40 mg at 09/02/17 2101  . predniSONE (DELTASONE) tablet 20 mg  20 mg Oral Q breakfast Mariel Aloe, MD   20 mg at 09/03/17 1884  . rOPINIRole (REQUIP) tablet 0.5 mg  0.5 mg Oral TID PRN Raiford Noble Latif, DO   0.5 mg at 09/02/17 2047     Discharge Medications: Please see discharge summary for a list of discharge medications.  Relevant Imaging Results:  Relevant Lab  Results:   Additional Information SSN: [id number] Patient needs IV antibiotics  Burnis Medin, LCSW

## 2017-09-03 NOTE — Progress Notes (Signed)
Physical Therapy Treatment Patient Details Name: Adam [name] MRN: [id number] DOB: May 18, 1926 Today's Date: 09/03/2017    History of Present Illness 84yo male with chief complaints of weakness and genearlized pain. Diagnosed with acute UTI; shoulders negative for fracture B, however does have cervical spondylolisthesis and neurosurgical consult is pending. PMH OA, pacemaker, CABG, CHF, depression/anxiety, hx dyspnea, gout, silent MI, aortical valve stenosis, orthostatic hypotension, RLS, [place] spotted fever, sick sinus syndrome, hx CVA with expressive aphasia     PT Comments    Assisted OOB to amb an increased distance in hallway.  Pt requires + 2 assist for safety and will need ST Rehab at SNF prior to returning to [facility] ALF[facility].   Follow Up Recommendations  SNF     Equipment Recommendations  None recommended by PT    Recommendations for Other Services       Precautions / Restrictions Precautions Precautions: Cervical;ICD/Pacemaker;Fall Precaution Comments: cervical spondyoloisthesis- has a soft collar/applied for OOB activity but removed when back to bed Restrictions Weight Bearing Restrictions: No    Mobility  Bed Mobility Overal bed mobility: Needs Assistance Bed Mobility: Supine to Sit     Supine to sit: Max assist     General bed mobility comments: increased time and use of bed pad to complete scooting to EOB  Transfers Overall transfer level: Needs assistance Equipment used: Rolling walker (2 wheeled) Transfers: Sit to/from Stand Sit to Stand: Mod assist;+2 physical assistance;+2 safety/equipment         General transfer comment: + 2 assist for safety and 25% VC's on proper hand placement   Ambulation/Gait Ambulation/Gait assistance: Max assist;+2 physical assistance;+2 safety/equipment Gait Distance (Feet): 20 Feet Assistive device: Rolling walker (2 wheeled) Gait Pattern/deviations: Step-through pattern;Shuffle;Decreased step length -  right;Decreased step length - left Gait velocity: decreased    General Gait Details: tolerated an increased distance but still limited due to weakness   Stairs             Wheelchair Mobility    Modified Rankin (Stroke Patients Only)       Balance                                            Cognition Arousal/Alertness: Awake/alert Behavior During Therapy: WFL for tasks assessed/performed Overall Cognitive Status: Within Functional Limits for tasks assessed                                 General Comments: pleasant and motivated       Exercises      General Comments        Pertinent Vitals/Pain Pain Assessment: Faces Faces Pain Scale: Hurts a little bit Pain Location: back of neck Pain Descriptors / Indicators: Constant Pain Intervention(s): Monitored during session;Repositioned;Heat applied    Home Living                      Prior Function            PT Goals (current goals can now be found in the care plan section) Progress towards PT goals: Progressing toward goals    Frequency    Min 2X/week      PT Plan Current plan remains appropriate    Co-evaluation  AM-PAC PT "6 Clicks" Daily Activity  Outcome Measure  Difficulty turning over in bed (including adjusting bedclothes, sheets and blankets)?: A Lot Difficulty moving from lying on back to sitting on the side of the bed? : A Lot Difficulty sitting down on and standing up from a chair with arms (e.g., wheelchair, bedside commode, etc,.)?: A Lot Help needed moving to and from a bed to chair (including a wheelchair)?: A Lot Help needed walking in hospital room?: A Lot Help needed climbing 3-5 steps with a railing? : Total 6 Click Score: 11    End of Session Equipment Utilized During Treatment: Gait belt Activity Tolerance: Patient limited by fatigue Patient left: in chair;with call bell/phone within reach;with chair alarm  set Nurse Communication: Mobility status PT Visit Diagnosis: Unsteadiness on feet (R26.81);Muscle weakness (generalized) (M62.81);History of falling (Z91.81);Difficulty in walking, not elsewhere classified (R26.2)     Time: 1117-1140 PT Time Calculation (min) (ACUTE ONLY): 23 min  Charges:  $Gait Training: 8-22 mins $Therapeutic Activity: 8-22 mins                     Rica Koyanagi  PTA [facility]  Acute  Rehab Pager      [phone]

## 2017-09-03 NOTE — Progress Notes (Signed)
CSW following to assist with discharge planning to [facility] SNF.  CSW contacted [facility] and provided medical update. Staff agreed to start insurance authorization [facility] Medicare).  CSW will continue to follow and assist with discharge planning.  Abundio Miu, [place] Social Worker [facility] Cell#: [phone]

## 2017-09-04 LAB — GLUCOSE, CAPILLARY: Glucose-Capillary: 89 mg/dL (ref 70–99)

## 2017-09-04 LAB — COMPREHENSIVE METABOLIC PANEL
ALT: 45 U/L — ABNORMAL HIGH (ref 0–44)
AST: 33 U/L (ref 15–41)
Albumin: 2.4 g/dL — ABNORMAL LOW (ref 3.5–5.0)
Alkaline Phosphatase: 61 U/L (ref 38–126)
Anion gap: 9 (ref 5–15)
BUN: 15 mg/dL (ref 8–23)
CO2: 26 mmol/L (ref 22–32)
Calcium: 8 mg/dL — ABNORMAL LOW (ref 8.9–10.3)
Chloride: 105 mmol/L (ref 98–111)
Creatinine, Ser: 0.51 mg/dL — ABNORMAL LOW (ref 0.61–1.24)
GFR calc Af Amer: >60 mL/min (ref >60)
GFR calc non Af Amer: >60 mL/min (ref >60)
Glucose, Bld: 94 mg/dL (ref 70–99)
Potassium: 3.4 mmol/L — ABNORMAL LOW (ref 3.5–5.1)
Sodium: 140 mmol/L (ref 135–145)
Total Bilirubin: 0.3 mg/dL (ref 0.3–1.2)
Total Protein: 5.8 g/dL — ABNORMAL LOW (ref 6.5–8.1)

## 2017-09-04 LAB — CBC WITH DIFFERENTIAL/PLATELET
Basophils Absolute: 0 10*3/uL (ref 0.0–0.1)
Basophils Relative: 0 %
Eosinophils Absolute: 0.1 10*3/uL (ref 0.0–0.7)
Eosinophils Relative: 1 %
HCT: 39.2 % (ref 39.0–52.0)
Hemoglobin: 12.8 g/dL — ABNORMAL LOW (ref 13.0–17.0)
Lymphocytes Relative: 19 %
Lymphs Abs: 2.3 10*3/uL (ref 0.7–4.0)
MCH: 31.8 pg (ref 26.0–34.0)
MCHC: 32.7 g/dL (ref 30.0–36.0)
MCV: 97.3 fL (ref 78.0–100.0)
Monocytes Absolute: 0.9 10*3/uL (ref 0.1–1.0)
Monocytes Relative: 7 %
Neutro Abs: 8.9 10*3/uL — ABNORMAL HIGH (ref 1.7–7.7)
Neutrophils Relative %: 73 %
Platelets: 354 10*3/uL (ref 150–400)
RBC: 4.03 MIL/uL — ABNORMAL LOW (ref 4.22–5.81)
RDW: 15 % (ref 11.5–15.5)
WBC: 12.2 10*3/uL — ABNORMAL HIGH (ref 4.0–10.5)

## 2017-09-04 MED ORDER — CEFTRIAXONE IV (FOR PTA / DISCHARGE USE ONLY): 2.0000 g | INTRAVENOUS | 0 refills | Status: AC

## 2017-09-04 MED ORDER — PREDNISONE 10 MG PO TABS: ORAL_TABLET | ORAL | 0 refills | Status: DC

## 2017-09-04 MED ORDER — GUAIFENESIN 100 MG/5ML PO SOLN: 5.0000 mL | ORAL | 0 refills | Status: DC | PRN

## 2017-09-04 MED ORDER — HYDROCODONE-ACETAMINOPHEN 5-325 MG PO TABS: 1.0000 | ORAL_TABLET | ORAL | 0 refills | Status: DC | PRN

## 2017-09-04 MED ORDER — MECLIZINE HCL 25 MG PO TABS: 25.0000 mg | ORAL_TABLET | Freq: Two times a day (BID) | ORAL | 0 refills | Status: DC | PRN

## 2017-09-04 MED ORDER — SODIUM CHLORIDE 0.9 % IV BOLUS
500.0000 mL | Freq: Once | INTRAVENOUS | Status: AC
  Administered 2017-09-04: 500 mL via INTRAVENOUS

## 2017-09-04 NOTE — Progress Notes (Signed)
   09/04/17 1214 09/04/17 1222 09/04/17 1228  Orthostatic Lying   BP- Lying 107/58  --   --   Pulse- Lying 71  --   --   Orthostatic Sitting  BP- Sitting  --  104/61  --   Pulse- Sitting  --  72  --   Orthostatic Standing at 0 minutes  BP- Standing at 0 minutes  --   --  91/49  Pulse- Standing at 0 minutes  --   --  73

## 2017-09-04 NOTE — Progress Notes (Signed)
Occupational Therapy Treatment Patient Details Name: Adam [name] MRN: [id number] DOB: 1926-11-18 Today's Date: 09/04/2017    History of present illness 84yo male with chief complaints of weakness and genearlized pain. Diagnosed with acute UTI; shoulders negative for fracture B, however does have cervical spondylolisthesis and neurosurgical consult is pending. PMH OA, pacemaker, CABG, CHF, depression/anxiety, hx dyspnea, gout, silent MI, aortical valve stenosis, orthostatic hypotension, RLS, [place] spotted fever, sick sinus syndrome, hx CVA with expressive aphasia    OT comments  Pt dizzy when standing and did not resolve when he sat.  Not initially dizzy when he sat up. Suspect orthostatic.  Sitting BP 98/50 and this am was mid 130s over 70s. Will try to take orthostatic vitals on next visit.  Pt tolerated neck soft collar backwards:  Tried in correct position twice and it was too painful  Follow Up Recommendations  SNF    Equipment Recommendations  None recommended by OT    Recommendations for Other Services      Precautions / Restrictions Precautions Precautions: Cervical;ICD/Pacemaker;Fall(check orthostatics) Precaution Comments: cervical spondyoloisthesis- has a soft collar/applied for OOB activity but removed when back to bed.  Pt did not tolerate 8/1:  placed it backwards and he states it helped Restrictions Weight Bearing Restrictions: No       Mobility Bed Mobility     Rolling: Max assist   Supine to sit: Max assist;+2 for safety/equipment Sit to supine: Total assist;+2 for physical assistance   General bed mobility comments: total A back to bed due to dizziness  Transfers   Equipment used: Rolling walker (2 wheeled)   Sit to Stand: Mod assist;+2 physical assistance;From elevated surface         General transfer comment: 2 trials from elevated bed    Balance Overall balance assessment: History of Falls                                          ADL either performed or assessed with clinical judgement   ADL                                         General ADL Comments: worked on sit to stand x 2 trials with goal of getting to chair--pt was dizzy.  Did not resolve after several minutes of sitting, suspect orthostatic.  Sitting BP 98/50.  Returned to supine     Vision       Perception     Praxis      Cognition Arousal/Alertness: Awake/alert Behavior During Therapy: WFL for tasks assessed/performed Overall Cognitive Status: Within Functional Limits for tasks assessed                                          Exercises   AAROM to L shoulder x 10 FF  Shoulder Instructions       General Comments      Pertinent Vitals/ Pain       Pain Assessment: Faces Faces Pain Scale: Hurts little more Pain Location: neck Pain Descriptors / Indicators: Constant Pain Intervention(s): Limited activity within patient's tolerance;Monitored during session;Repositioned;Premedicated before session  Home Living  Prior Functioning/Environment              Frequency  Min 2X/week        Progress Toward Goals  OT Goals(current goals can now be found in the care plan section)  Progress towards OT goals: (limited by dizziness)  Acute Rehab OT Goals Patient Stated Goal: get pain under control   Plan      Co-evaluation                 AM-PAC PT "6 Clicks" Daily Activity     Outcome Measure   Help from another person eating meals?: A Little Help from another person taking care of personal grooming?: A Lot Help from another person toileting, which includes using toliet, bedpan, or urinal?: A Lot Help from another person bathing (including washing, rinsing, drying)?: A Lot Help from another person to put on and taking off regular upper body clothing?: A Lot Help from another person to put on and taking off regular  lower body clothing?: Total 6 Click Score: 12    End of Session    OT Visit Diagnosis: Unsteadiness on feet (R26.81);Muscle weakness (generalized) (M62.81);History of falling (Z91.81);Other abnormalities of gait and mobility (R26.89);Repeated falls (R29.6);Feeding difficulties (R63.3)   Activity Tolerance (dizziness)   Patient Left in bed;with call bell/phone within reach;with family/visitor present;with nursing/sitter in room   Nurse Communication (dizziness)        Time: 3291-9166 OT Time Calculation (min): 34 min  Charges: OT General Charges $OT Visit: 1 Visit OT Treatments $Therapeutic Activity: 23-37 mins  Lesle Chris, OTR/L [phone] 09/04/2017   Kasha Howeth 09/04/2017, 9:46 AM

## 2017-09-04 NOTE — Progress Notes (Signed)
PHARMACY CONSULT NOTE FOR:  OUTPATIENT  PARENTERAL ANTIBIOTIC THERAPY (OPAT)  Indication: Bacteremia Regimen: ceftriaxone 2 gm IV q24 End date: 09/07/2017  IV antibiotic discharge orders are pended. To discharging provider:  please sign these orders via discharge navigator,  Select New Orders & click on the button choice - Manage This Unsigned Work.     Thank you for allowing pharmacy to be a part of this patient's care.  Eudelia Bunch, Pharm.D [phone] 09/04/2017 1:22 PM

## 2017-09-04 NOTE — Clinical Social Work Placement (Signed)
Patient returning to [facility] SNF. Facility confirmed patient's ability to return. PTAR contacted, patient's family aware. Patient's RN can call report to [phone], packet complete. CSW signing off, no other needs identified at this time.  CLINICAL SOCIAL WORK PLACEMENT  NOTE  Date:  09/04/2017  Patient Details  Name: Adam [name] MRN: [id number] Date of Birth: May 25, 1926  Clinical Social Work is seeking post-discharge placement for this patient at the [place] level of care (*CSW will initial, date and re-position this form in  chart as items are completed):  Yes   Patient/family provided with [place] Work Department's list of facilities offering this level of care within the geographic area requested by the patient (or if unable, by the patient's family).  Yes   Patient/family informed of their freedom to choose among providers that offer the needed level of care, that participate in Medicare, Medicaid or managed care program needed by the patient, have an available bed and are willing to accept the patient.  Yes   Patient/family informed of [place]'s ownership interest in [facility] and [facility], as well as of the fact that they are under no obligation to receive care at these facilities.  PASRR submitted to EDS on       PASRR number received on       Existing PASRR number confirmed on 09/03/17     FL2 transmitted to all facilities in geographic area requested by pt/family on 09/03/17     FL2 transmitted to all facilities within larger geographic area on       Patient informed that his/her managed care company has contracts with or will negotiate with certain facilities, including the following:        Yes   Patient/family informed of bed offers received.  Patient chooses bed at [facility]     Physician recommends and patient chooses bed at      Patient to be transferred to [facility] on  09/04/17.  Patient to be transferred to facility by PTAR     Patient family notified on 09/04/17 of transfer.  Name of family member notified:  Yoshimura,[name]     PHYSICIAN       Additional Comment:    _______________________________________________ Burnis Medin, LCSW 09/04/2017, 3:50 PM

## 2017-09-04 NOTE — Progress Notes (Signed)
  Speech Language Pathology Treatment: Dysphagia  Patient Details Name: Adam [name] MRN: [id number] DOB: 1926-07-27 Today's Date: 09/04/2017 Time: 1517-6160 SLP Time Calculation (min) (ACUTE ONLY): 25 min  Assessment / Plan / Recommendation Clinical Impression  Pt/wife seen for continued skilled treatment focused on established goals, including use/education on RMST. Pt continues to require ongoing cues to utilize each trainer effectively. 10 repetitions were completed with each device. Pt/wife were encouraged to complete trainings prior to each meal, thereby providing 3 opportunities to complete exercises each day. No change made in resistance (RMST expiratory @ 14cm H2O, inspiratory @ 9cm H2O). Pt was observed with thin liquids via straw. No overt s/s aspiration observed or reported. Pt/wife reminded to position pt upright during all po intake.   Recommend continued skilled ST intervention at next level of care.    HPI HPI: 84 year old male admitted 08/25/17 with pain and weakness, UTI. PMH: CAD/CABG, CHF, depression/anxiety, bradycardia, sick sinus syndrome, pacer, CVA, GERD, gout, heart [place]mer, [place] Spotted Fever, Melanoma, HTN, RLS, urinary retention, MI x2      SLP Plan  Continue with current plan of care       Recommendations  Diet recommendations: Dysphagia 3 (mechanical soft);Thin liquid Liquids provided via: Straw Medication Administration: Crushed with puree Supervision: Patient able to self feed Compensations: Slow rate;Small sips/bites;Other (Comment);Multiple dry swallows after each bite/sip(begin meal with liquid) Postural Changes and/or Swallow Maneuvers: Seated upright 90 degrees;Upright 30-60 min after meal                Oral Care Recommendations: Oral care BID Follow up Recommendations: Skilled Nursing facility SLP Visit Diagnosis: Dysphagia, oropharyngeal phase (R13.12) Plan: Continue with current plan of care       [facility] [place],  CCC-SLP Speech Language Pathologist [phone]  Shonna Chock 09/04/2017, 11:12 AM

## 2017-09-04 NOTE — Progress Notes (Signed)
Physical Therapy Treatment Patient Details Name: Adam [name] MRN: [id number] DOB: June 01, 1926 Today's Date: 09/04/2017    History of Present Illness 84yo male with chief complaints of weakness and genearlized pain. Diagnosed with acute UTI; shoulders negative for fracture B, however does have cervical spondylolisthesis and neurosurgical consult is pending. PMH OA, pacemaker, CABG, CHF, depression/anxiety, hx dyspnea, gout, silent MI, aortical valve stenosis, orthostatic hypotension, RLS, [place] spotted fever, sick sinus syndrome, hx CVA with expressive aphasia     PT Comments    Assisted OOB and took orthostatic BP's (see vitals section).  Pt progressing slowly and plans to D/C to SNF at [facility].   Follow Up Recommendations  SNF     Equipment Recommendations  None recommended by PT    Recommendations for Other Services       Precautions / Restrictions Precautions Precautions: Cervical;ICD/Pacemaker;Fall Precaution Comments: cervical spondyoloisthesis- has a soft collar/applied for OOB activity but removed when back to bed.  Pt did not tolerate 8/1:  placed it backwards and he states it helped Restrictions Weight Bearing Restrictions: No Other Position/Activity Restrictions: WBAT    Mobility  Bed Mobility Overal bed mobility: Needs Assistance Bed Mobility: Supine to Sit     Supine to sit: Max assist;+2 for safety/equipment     General bed mobility comments: increased time, elevated HOB and use of bed pad to complete scooting to EOB  rigid throughout  Transfers Overall transfer level: Needs assistance Equipment used: Rolling walker (2 wheeled) Transfers: Sit to/from Stand Sit to Stand: Mod assist;+2 physical assistance;From elevated surface;Max assist         General transfer comment: severe posterior lean and slow to rise.  Rigid throughout.   Ambulation/Gait Ambulation/Gait assistance: Max assist;+2 physical assistance;+2 safety/equipment Gait  Distance (Feet): 8 Feet Assistive device: 4-wheeled walker Gait Pattern/deviations: Step-through pattern;Shuffle;Decreased step length - right;Decreased step length - left Gait velocity: decreased    General Gait Details: decreased amb distance with rollator vs walker due to gait instability and fatigue   Stairs             Wheelchair Mobility    Modified Rankin (Stroke Patients Only)       Balance                                            Cognition Arousal/Alertness: Awake/alert Behavior During Therapy: WFL for tasks assessed/performed Overall Cognitive Status: Within Functional Limits for tasks assessed                                 General Comments: pleasant and motivated       Exercises      General Comments        Pertinent Vitals/Pain Pain Assessment: Faces Faces Pain Scale: Hurts little more Pain Location: neck Pain Descriptors / Indicators: Constant;Grimacing Pain Intervention(s): Monitored during session;Repositioned;Patient requesting pain meds-RN notified    Home Living                      Prior Function            PT Goals (current goals can now be found in the care plan section) Progress towards PT goals: Progressing toward goals    Frequency    Min 2X/week      PT Plan  Current plan remains appropriate    Co-evaluation              AM-PAC PT "6 Clicks" Daily Activity  Outcome Measure  Difficulty turning over in bed (including adjusting bedclothes, sheets and blankets)?: A Lot Difficulty moving from lying on back to sitting on the side of the bed? : A Lot Difficulty sitting down on and standing up from a chair with arms (e.g., wheelchair, bedside commode, etc,.)?: A Lot Help needed moving to and from a bed to chair (including a wheelchair)?: A Lot Help needed walking in hospital room?: A Lot Help needed climbing 3-5 steps with a railing? : Total 6 Click Score: 11    End of  Session Equipment Utilized During Treatment: Gait belt Activity Tolerance: Patient limited by fatigue Patient left: in chair;with call bell/phone within reach;with chair alarm set Nurse Communication: Mobility status PT Visit Diagnosis: Unsteadiness on feet (R26.81);Muscle weakness (generalized) (M62.81);History of falling (Z91.81);Difficulty in walking, not elsewhere classified (R26.2)     Time: 1541-1610 PT Time Calculation (min) (ACUTE ONLY): 29 min  Charges:  $Gait Training: 8-22 mins $Therapeutic Activity: 8-22 mins                     Rica Koyanagi  PTA [facility]  Acute  Rehab Pager      [phone]

## 2017-09-04 NOTE — Discharge Summary (Signed)
[facility] Discharge Summary   Patient: Adam [name] FFM:[id number]   PCP: Blanchie Serve, MD DOB: 04-Aug-1926   Date of admission: 08/25/2017   Date of discharge:  09/04/2017    Discharge Diagnoses:  Active Problems:   Cardiomyopathy, ischemic   Pacemaker   Hyperlipidemia   SSS (sick sinus syndrome) (HCC)   Hypothyroidism   GERD (gastroesophageal reflux disease)   Urinary retention   Coronary artery disease involving native coronary artery of native heart without angina pectoris   Expressive aphasia   RLS (restless legs syndrome)   Combined congestive systolic and diastolic heart failure (HCC)   Urinary tract infection, acute   Hx of CABG   Nonrheumatic aortic valve stenosis   History of arterial ischemic stroke   Weak   Decubitus ulcer of sacral region, stage 2   Neurogenic orthostatic hypotension (HCC)   Neurogenic bladder   Generalized weakness   Shoulder pain, bilateral   Bacteremia   Admitted From: ALF Disposition:  SNF  Recommendations for Outpatient Follow-up:  1. Please follow up with PCP in1  Week He will need repeat BCx 1 week after completing IV rx   Contact information for follow-up providers    Blanchie Serve, MD. Schedule an appointment as soon as possible for a visit in 1 week(s).   Specialty:  Internal Medicine Contact information: [address] [place] [address] [phone]        Eustace Moore, MD. Schedule an appointment as soon as possible for a visit in 1 month(s).   Specialty:  Neurosurgery Contact information: [address] [address] [place] [address] [phone]        Croitoru, Dani Gobble, MD. Schedule an appointment as soon as possible for a visit in 1 month(s).   Specialty:  Cardiology Contact information: [address] [place] [address] [phone]            Contact information for after-discharge care    Destination    [facility] .   Service:  Skilled  Nursing Contact information: [address] [place] [phone]                 Diet recommendation: Dysphagia 3 (mechanical soft);Thin liquid Liquids provided via: Straw Medication Administration: Crushed with puree Supervision: Patient able to self feed Compensations: Slow rate;Small sips/bites;Other (Comment);Multiple dry swallows after each bite/sip(begin meal with liquid) Postural Changes and/or Swallow Maneuvers: Seated upright 90 degrees;Upright 30-60 min after meal  Activity: The patient is advised to gradually reintroduce usual activities.  Discharge Condition: good  Code Status: full code  History of present illness: As per the H and P dictated on admission, "Adam [name] is a 84 y.o. male with medical history significant of multiple comorbidities including CAD status post CABG cardiomyopathy, combined CHF, depression with anxiety, bradycardia and sick sinus syndrome status post pacer, treated embolic CVA, GERD, gout, history of heart murmur which is non-rheumatic aortic valve stenosis, RLS, Urinary Retention s/p Indwelling foley catheter who presents with a cc of generalized body pain and weakness. Patient states that the paint started in his left shoulder last week on Thursday and radiated to his right shoulder and then his lower extremities.  States that he had a poor appetite since then cannot eat or sleep and his last real meal was on Thursday.  Denies any nausea or vomiting.  Denies any chest pain or shortness breath.  States that he has been generally weak and  stumbled and almost fell but was caught by 3 men.  States he does feel weaker and has had  intermittent diarrhea but none the last week.  Is at friend's home and denies any other complaints but states that he does have headaches no vision changes.  States his restless leg syndrome bothers him significantly.  Upon further work-up in the ED patient was found to have acute urinary  tract infection secondary to his urinary catheter so [facility] was consulted for admission for urinary tract infection and generalized weakness."  Hospital Course:  Summary of his active problems in the hospital is as following. Klebsiella oxytoca bacteremia due to UTI associated with chronic foley Patient with a chronic indwelling catheter. Urine culture significant for E. Coli and klebsiella.  Both are sensitive to ceftriaxone. Blood culture also positive for klebsiella  Secondary to UTI.  ID consulted  Complicated by PPM. EP recommending no Transesophageal Echocardiogram or lead extraction and felt pt does not have any endocarditis. ID recommended 2 weeks of IV Antibiotics. He will need repeat BCx 1 week after completing IV rx  Weakness Fall Unknown etiology. Possibly related to UTI. Patient with degenerative neck disease which may also be playing a role. X-ray obtained of bilateral shoulders which was significant for mild arthritis. Per wife, she does not think his weakness is significantly far from baseline. CT neck shows Spondylosis.  No worrisome osseous lesion. -PT/OT eval: SNF  Odynophagia Dysphagia New problem from admission.  ?musculoskeletal in nature, but initially symptoms were severe per patient and was unable to swallow at all.  GI consulted felt pt not a candidate for any intervention, not likely to have any specific etiology that would be reversible  SLP recommended dysphagia 3 diet on 7/29. Overall, improving. CT neck No pharyngeal or neck inflammatory process is evident. Airway appears patent.  Neck pain I think most of this is musculoskeletal.  Transition to prednisone 20 mg daily from solu-medrol taper quickly There was some concern for PMR on admission but I dont think that the pt has PMR, in any case may benefit from repeat ESR CRP once infection is settled and pt finished his therapy course.   Spondylolisthesis of cervical spine Discussed with  neurosurgery, Dr. Ronnald Ramp, who recommend outpatient follow-up. -Soft neck collar for comfort as needed  Hypothyroidism TSH normal. -Continue Synthroid  GERD Protonix   S/p pacemaker  Restless leg syndrome -Continue Requip  History of CVA Expressive aphasia -Continue aspirin and plavix  Orthostatic hypotension Chronic -Continue midodrine and droxidopa (unable to obtain inpatient)  Hyperlipidemia -Continue pravastatin.  Chronic combined systolic and diastolic heart failure History of CAD s/p CABG No chest pain. EF more reduced with a current EF of 35-40% Follow up with cardiology -Continue aspirin and plavix  Aortic stenosis Chronic. Stable.  Moisture associated skin damage Present on gluteal fold and right buttock crease. Present on admission. Wound care.  Tremor One time episode as an outpatient. No recurrence. In setting of UTI and bacteremia.   All other chronic medical condition were stable during the hospitalization.  Patient was seen by physical therapy, who recommended SNF, which was arranged by Education officer, museum and case Freight forwarder. On the day of the discharge the patient's vitals were stable , and no other acute medical condition were reported by patient. the patient was felt safe to be discharge at SNF with therapy.  Consultants: ID  Neurosurgery (telephone)  Gastroenterology  Palliative care medicine  EP  Speech therapy  Procedures:  Echocardiogram  Study Conclusions  -  Left ventricle: The cavity size was normal. Wall thickness was   increased in a pattern of severe LVH. Systolic function was   moderately reduced. The estimated ejection fraction was in the   range of 35% to 40%. Akinesis of the basalinferior myocardium.   Doppler parameters are consistent with a reversible restrictive   pattern, indicative of decreased left ventricular diastolic   compliance and/or increased left atrial pressure (grade 3   diastolic dysfunction). -  Aortic valve: There was moderate stenosis. There was mild   regurgitation. Valve area (VTI): 1.17 cm^2. Valve area (Vmax):   1.22 cm^2. Valve area (Vmean): 1.34 cm^2.    DISCHARGE MEDICATION: Allergies as of 09/04/2017      Reactions   Altace [ramipril] Cough   Crestor [rosuvastatin Calcium] Rash   Penicillins Rash, Other (See Comments)   Has patient had a PCN reaction causing immediate rash, facial/tongue/throat swelling, SOB or lightheadedness with hypotension: Yes Has patient had a PCN reaction causing severe rash involving mucus membranes or skin necrosis: No Has patient had a PCN reaction that required hospitalization No Has patient had a PCN reaction occurring within the last 10 years: No If all of the above answers are "NO", then may proceed with Cephalosporin use.   Sulfa Antibiotics Rash      Medication List    TAKE these medications   acetaminophen 325 MG tablet Commonly known as:  TYLENOL Take 650 mg by mouth every 8 (eight) hours as needed for mild pain, moderate pain or headache. What changed:  Another medication with the same name was removed. Continue taking this medication, and follow the directions you see here.   amiodarone 100 MG tablet Commonly known as:  PACERONE Take 1 tablet (100 mg total) by mouth daily.   aspirin EC 81 MG tablet Take 1 tablet (81 mg total) by mouth daily.   cefTRIAXone IVPB Commonly known as:  ROCEPHIN Inject 2 g into the vein daily for 3 days. Indication:  Bacteremia Last Day of Therapy:  09/07/2017 Labs - Once weekly:  CBC/D and BMP, Labs - Every other week:  ESR and CRP   clopidogrel 75 MG tablet Commonly known as:  PLAVIX TAKE 1 TABLET ONCE DAILY.   Droxidopa 100 MG Caps Take 100 mg by mouth 3 (three) times daily.   guaiFENesin 100 MG/5ML Soln Commonly known as:  ROBITUSSIN Take 5 mLs (100 mg total) by mouth every 4 (four) hours as needed for cough or to loosen phlegm.   HYDROcodone-acetaminophen 5-325 MG tablet Commonly  known as:  NORCO/VICODIN Take 1 tablet by mouth every 4 (four) hours as needed for severe pain.   levothyroxine 100 MCG tablet Commonly known as:  SYNTHROID, LEVOTHROID Take 100 mcg by mouth daily before breakfast.   Lidocaine 4 % Ptch Apply 1 patch topically daily. Left shoulder   meclizine 25 MG tablet Commonly known as:  ANTIVERT Take 1 tablet (25 mg total) by mouth 2 (two) times daily as needed for dizziness.   Melatonin 3 MG Tabs Take 3 mg by mouth at bedtime.   midodrine 10 MG tablet Commonly known as:  PROAMATINE TAKE 1 TABLET THREE TIMES DAILY. TAKE LAST DOSE AT LEAST 6 HOURS BEFORE BEDTIME.   pantoprazole 40 MG tablet Commonly known as:  PROTONIX TAKE 1 TABLET BY MOUTH TWICE DAILY.   polyethylene glycol packet Commonly known as:  MIRALAX / GLYCOLAX Take 17 g by mouth daily. Hold for loose stool   pravastatin 40 MG tablet Commonly known as:  PRAVACHOL Take 1 tablet (40 mg total) by mouth at bedtime.   predniSONE 10 MG tablet Commonly known as:  DELTASONE Take 40m daily for 3days,Take 113mdaily for 3days, then stop.   rOPINIRole 0.5 MG tablet Commonly known as:  REQUIP TAKE 1 TABLET 3 TIMES A DAY AS NEEDED.   sennosides-docusate sodium 8.6-50 MG tablet Commonly known as:  SENOKOT-S Take 2 tablets by mouth at bedtime.   torsemide 10 MG tablet Commonly known as:  DEMADEX Take 5 mg by mouth daily.   traZODone 50 MG tablet Commonly known as:  DESYREL Take 1 tablet (50 mg total) by mouth at bedtime.   Vitamin D3 5000 units Caps Take 5,000 Units by mouth every Monday.            Home Infusion Instuctions  (From admission, onward)        Start     Ordered   09/04/17 0000  Home infusion instructions [facility] May follow [facility] Protocol; May administer Cathflo as needed to maintain patency of vascular access device.; Flushing of vascular access device: [facility]: 0.9% NaCl pre/post medica...    Question Answer Comment    Instructions May follow [facility] Protocol   Instructions May administer Cathflo as needed to maintain patency of vascular access device.   Instructions Flushing of vascular access device: per [facility]: 0.9% NaCl pre/post medication administration and prn patency; Heparin 100 u/ml, 92m67mor implanted ports and Heparin 10u/ml, 92ml13mr all other central venous catheters.   Instructions May follow [facility] Anaphylaxis Protocol for First Dose Administration in the home: 0.9% NaCl at 25-50 ml/hr to maintain IV access for protocol meds. Epinephrine 0.3 ml IV/IM PRN and Benadryl 25-50 IV/IM PRN s/s of anaphylaxis.   Instructions [facility] Infusion Coordinator (RN) to assist per patient IV care needs in the home PRN.      09/04/17 1507     Allergies  Allergen Reactions  . Altace [Ramipril] Cough  . Crestor [Rosuvastatin Calcium] Rash  . Penicillins Rash and Other (See Comments)    Has patient had a PCN reaction causing immediate rash, facial/tongue/throat swelling, SOB or lightheadedness with hypotension: Yes Has patient had a PCN reaction causing severe rash involving mucus membranes or skin necrosis: No Has patient had a PCN reaction that required hospitalization No Has patient had a PCN reaction occurring within the last 10 years: No If all of the above answers are "NO", then may proceed with Cephalosporin use.   . Sulfa Antibiotics Rash   Discharge Instructions    Diet - low sodium heart healthy   Complete by:  As directed    Discharge instructions   Complete by:  As directed    It is important that you read following instructions as well as go over your medication list with RN to help you understand your care after this hospitalization.  Discharge Instructions: Please follow-up with PCP in one week  Please request your primary care physician to go over all Hospital Tests and Procedure/Radiological results at the follow up,  Please get all Hospital records sent to your  PCP by signing hospital release before you go home.   Do not drive, operating heavy machinery, perform activities at heights, swimming or participation in water activities or provide baby sitting services while you are on Pain, Sleep and Anxiety Medications; until you have been seen by Primary Care Physician or a Neurologist and advised to do so again. Do not take  more than prescribed Pain, Sleep and Anxiety Medications. You were cared for by a hospitalist during your hospital stay. If you have any questions about your discharge medications or the care you received while you were in the hospital after you are discharged, you can call the unit and ask to speak with the hospitalist on call if the hospitalist that took care of you is not available.  Once you are discharged, your primary care physician will handle any further medical issues. Please note that NO REFILLS for any discharge medications will be authorized once you are discharged, as it is imperative that you return to your primary care physician (or establish a relationship with a primary care physician if you do not have one) for your aftercare needs so that they can reassess your need for medications and monitor your lab values. You Must read complete instructions/literature along with all the possible adverse reactions/side effects for all the Medicines you take and that have been prescribed to you. Take any new Medicines after you have completely understood and accept all the possible adverse reactions/side effects. Wear Seat belts while driving. If you have smoked or chewed Tobacco in the last 2 yrs please stop smoking and/or stop any Recreational drug use.   Home infusion instructions [facility] May follow [place] Dosing Protocol; May administer Cathflo as needed to maintain patency of vascular access device.; Flushing of vascular access device: per [facility] Protocol: 0.9% NaCl pre/post medica...   Complete by:  As directed     Instructions:  May follow [place] Dosing Protocol   Instructions:  May administer Cathflo as needed to maintain patency of vascular access device.   Instructions:  Flushing of vascular access device: per [facility] Protocol: 0.9% NaCl pre/post medication administration and prn patency; Heparin 100 u/ml, 73m for implanted ports and Heparin 10u/ml, 548mfor all other central venous catheters.   Instructions:  May follow [facility] Anaphylaxis Protocol for First Dose Administration in the home: 0.9% NaCl at 25-50 ml/hr to maintain IV access for protocol meds. Epinephrine 0.3 ml IV/IM PRN and Benadryl 25-50 IV/IM PRN s/s of anaphylaxis.   Instructions:  [facility] Coordinator (RN) to assist per patient IV care needs in the home PRN.   Increase activity slowly   Complete by:  As directed      Discharge Exam: Filed Weights   09/01/17 0500 09/02/17 0530 09/03/17 0502  Weight: 75.3 kg (166 lb 0.1 oz) 76.7 kg (169 lb 1.6 oz) 76.8 kg (169 lb 4.8 oz)   Vitals:   09/04/17 1228 09/04/17 1405  BP: (!) 91/49 (!) 141/71  Pulse: 73 71  Resp:  16  Temp:  98 F (36.7 C)  SpO2:  96%   General: Appear in no distress, no Rash; Oral Mucosa moist Cardiovascular: S1 and S2 Present, no Murmur, no JVD Respiratory: Bilateral Air entry present and Clear to Auscultation, no Crackles, no wheezes Abdomen: Bowel Sound present, Soft and no tenderness Extremities: no Pedal edema, no calf tenderness Neurology: Grossly no focal neuro deficit.  The results of significant diagnostics from this hospitalization (including imaging, microbiology, ancillary and laboratory) are listed below for reference.    Significant Diagnostic Studies: Dg Chest 2 View  Result Date: 08/25/2017 CLINICAL DATA:  Weakness. EXAM: CHEST - 2 VIEW COMPARISON:  11/27/2016 FINDINGS: There is a large chronic hiatal hernia. There is slight atelectasis at the lung bases but the patient has taken a very shallow inspiration. No discrete  infiltrates or effusions.  Heart size is normal. CABG. Pacemaker in place. No acute bone abnormality. IMPRESSION: Mild bibasilar atelectasis, at least in part due to a shallow inspiration. Large chronic hiatal hernia. Electronically Signed   By: [name] M.D.   On: 08/25/2017 13:13   Dg Shoulder Right  Result Date: 08/25/2017 CLINICAL DATA:  84 year old male with bilateral shoulder pain. No injury. Initial encounter. EXAM: RIGHT SHOULDER - 2+ VIEW COMPARISON:  None. FINDINGS: Mild to moderate acromioclavicular joint and acromial humeral joint degenerative changes. No fracture or dislocation. Visualized lungs clear. IMPRESSION: Mild to moderate acromioclavicular joint and acromial humeral joint degenerative changes. Electronically Signed   By: Genia Del M.D.   On: 08/25/2017 18:58   Ct Head Wo Contrast  Result Date: 08/25/2017 CLINICAL DATA:  Altered mental status and possible fall. EXAM: CT HEAD WITHOUT CONTRAST CT CERVICAL SPINE WITHOUT CONTRAST TECHNIQUE: Multidetector CT imaging of the head and cervical spine was performed following the standard protocol without intravenous contrast. Multiplanar CT image reconstructions of the cervical spine were also generated. COMPARISON:  Head CT 01/11/2016 FINDINGS: CT HEAD FINDINGS Brain: There is no mass, hemorrhage or extra-axial collection. There is generalized atrophy without lobar predilection. There is no acute or chronic infarction. There is hypoattenuation of the periventricular white matter, most commonly indicating chronic ischemic microangiopathy. Vascular: Atherosclerotic calcification of the internal carotid arteries at the skull base. No abnormal hyperdensity of the major intracranial arteries or dural venous sinuses. Skull: The visualized skull base, calvarium and extracranial soft tissues are normal. Sinuses/Orbits: No fluid levels or advanced mucosal thickening of the visualized paranasal sinuses. No mastoid or middle ear effusion. The  orbits are normal. CT CERVICAL SPINE FINDINGS Alignment: Grade 1 anterolisthesis at C3-C4 and C4-C5. This is secondary to facet hypertrophy. Alignment is otherwise normal. Skull base and vertebrae: No acute fracture. Soft tissues and spinal canal: No prevertebral fluid or swelling. No visible canal hematoma. Disc levels: Fusion of the left C3-4 facet joint. Facet arthrosis is most severe at right C4-C6. No spinal canal stenosis. Upper chest: No pneumothorax, pulmonary nodule or pleural effusion. Other: Normal visualized paraspinal cervical soft tissues. IMPRESSION: 1. Chronic ischemic microangiopathy and generalized volume loss without acute intracranial abnormality. 2. No acute fracture of the cervical spine. 3. Multilevel moderate-to-severe facet arthrosis with resultant spondylolisthesis. Electronically Signed   By: Ulyses Jarred M.D.   On: 08/25/2017 13:53   Ct Soft Tissue Neck W Contrast  Result Date: 08/29/2017 CLINICAL DATA:  Neck pain and BILATERAL shoulder pain. Difficulty swallowing. EXAM: CT NECK WITH CONTRAST TECHNIQUE: Multidetector CT imaging of the neck was performed using the standard protocol following the bolus administration of intravenous contrast. CONTRAST:  62m OMNIPAQUE IOHEXOL 300 MG/ML  SOLN COMPARISON:  None. FINDINGS: Pharynx and larynx: Normal. No mass or swelling. Salivary glands: No inflammation, mass, or stone. Thyroid: Normal. Lymph nodes: None enlarged or abnormal density. Vascular: Atherosclerosis.  Vessel patency is established. Limited intracranial: Generalized atrophy. Visualized orbits: Barely visualized, no gross abnormality. Mastoids and visualized paranasal sinuses: No layering sinus or mastoid fluid. Skeleton: Spondylosis.  No worrisome osseous lesion. Upper chest: No pneumothorax. Mild vascular congestion. No visible lung mass. Pacemaker, LEFT subclavian approach. Prior median sternotomy for CABG. Other: Healed IMPRESSION: No pharyngeal or neck inflammatory process is  evident. Airway appears patent. No esophageal air-fluid level or significant neck mass or adenopathy is evident. Electronically Signed   By: JStaci RighterM.D.   On: 08/29/2017 13:07   Ct Cervical Spine Wo Contrast  Result Date: 08/25/2017  CLINICAL DATA:  Altered mental status and possible fall. EXAM: CT HEAD WITHOUT CONTRAST CT CERVICAL SPINE WITHOUT CONTRAST TECHNIQUE: Multidetector CT imaging of the head and cervical spine was performed following the standard protocol without intravenous contrast. Multiplanar CT image reconstructions of the cervical spine were also generated. COMPARISON:  Head CT 01/11/2016 FINDINGS: CT HEAD FINDINGS Brain: There is no mass, hemorrhage or extra-axial collection. There is generalized atrophy without lobar predilection. There is no acute or chronic infarction. There is hypoattenuation of the periventricular white matter, most commonly indicating chronic ischemic microangiopathy. Vascular: Atherosclerotic calcification of the internal carotid arteries at the skull base. No abnormal hyperdensity of the major intracranial arteries or dural venous sinuses. Skull: The visualized skull base, calvarium and extracranial soft tissues are normal. Sinuses/Orbits: No fluid levels or advanced mucosal thickening of the visualized paranasal sinuses. No mastoid or middle ear effusion. The orbits are normal. CT CERVICAL SPINE FINDINGS Alignment: Grade 1 anterolisthesis at C3-C4 and C4-C5. This is secondary to facet hypertrophy. Alignment is otherwise normal. Skull base and vertebrae: No acute fracture. Soft tissues and spinal canal: No prevertebral fluid or swelling. No visible canal hematoma. Disc levels: Fusion of the left C3-4 facet joint. Facet arthrosis is most severe at right C4-C6. No spinal canal stenosis. Upper chest: No pneumothorax, pulmonary nodule or pleural effusion. Other: Normal visualized paraspinal cervical soft tissues. IMPRESSION: 1. Chronic ischemic microangiopathy and  generalized volume loss without acute intracranial abnormality. 2. No acute fracture of the cervical spine. 3. Multilevel moderate-to-severe facet arthrosis with resultant spondylolisthesis. Electronically Signed   By: Ulyses Jarred M.D.   On: 08/25/2017 13:53   Dg Shoulder Left  Result Date: 08/25/2017 CLINICAL DATA:  84 year old male with bilateral shoulder pain. No injury. Initial encounter. EXAM: LEFT SHOULDER - 2+ VIEW COMPARISON:  09/25/2006 MR. FINDINGS: Moderate acromioclavicular joint degenerative changes. Superiorly located humeral head consistent with rotator cuff tear with acromial humeral joint degenerative changes. No fracture or dislocation. Pacemaker in place. Visualized lungs clear. IMPRESSION: Moderate acromioclavicular joint degenerative changes. Superiorly located humeral head consistent with rotator cuff tear with acromial humeral joint degenerative changes. Electronically Signed   By: Genia Del M.D.   On: 08/25/2017 19:00   Dg Swallowing Func-speech Pathology  Result Date: 09/01/2017 Objective Swallowing Evaluation: Type of Study: MBS-Modified Barium Swallow Study  Patient Details Name: ZIMRI BRENNEN MRN: [id number] Date of Birth: 1926-02-25 Today's Date: 09/01/2017 Time: SLP Start Time (ACUTE ONLY): 1535 -SLP Stop Time (ACUTE ONLY): 1615 SLP Time Calculation (min) (ACUTE ONLY): 40 min Past Medical History: Past Medical History: Diagnosis Date . Arthritis   "minor, back and sometimes knees" (12/15/2012) . Bradycardia   AFib/SSS s/p St Jude PPM 04/12/2008 . CAD (coronary artery disease) 12/30/2014  CABG (LIMA-LAD, SVG-RCA, SVG-OM in 1996).  07/2009 BMS to SVG-RCA. Cath in 04/2010 with patent stents  . Cardiomyopathy, ischemic 08/25/2012 . CHF (congestive heart failure) ([place])  . Chronic knee pain 12/03/2014 . Combined congestive systolic and diastolic heart failure ([place]) 02/02/2015  Hx EF 41%. BNP 96.8 02/21/15 Torsemide 04/06/15 Na 142, K 4.6, Bun 16, creat 0.89 04/20/14 BNP 111.7, Na 142, K  4.6, Bun 16, creat 0.9  . Depression with anxiety 02/02/2015  02/21/15 Hgb A1c 5.8 03/10/15 MMSE 30/30  . Dizziness, after diuretic asscoiated with hypotension and responded to fluid bolus 06/05/2011  04/28/15 US carotid R+L normal bilateral arterial velocities.   Marland Kitchen Dyspnea 08/11/2014  Followed in Pulmonary clinic/ [facility]/ [place]  - 08/11/2014  Walked RA x 1 laps @  185 ft each stopped due to fatigue/off balance/ slow pace/  no sob or desat  - PFT's  09/26/2014  FEV1 2.26 (85 % ) ratio 76  p no % improvement from saba with DLCO  67 % corrects to 93 % for alv volume      Since prev study 08/04/13 minimal change lung vol or dlco   . Embolic cerebral infarction ([place]) 12/06/2015 . Exertional shortness of breath   "sometimes walking" (12/15/2012) . GERD (gastroesophageal reflux disease)  . Gout 02/09/2015 . Heart murmur   "just told I had one today" (12/15/2012) . Hiatal hernia  . Hyperlipidemia  . Hypertension  . Hypothyroid  . Influenza A 03/10/2016 . Insomnia  . Melanoma of back ([place]) 1976 . Myocardial infarction [facility]) 1996; 2011  "both silent" (12/15/2012) . Nonrheumatic aortic valve stenosis  . Orthostatic hypotension  . Osteoporosis, senile  . Pacemaker  . RBBB  . Restless leg 02/02/2015 . Right leg weakness 12/06/2015 . [facility] spotted fever  . S/P CABG x 4  . Sick sinus syndrome ([place]) 01/31/2014 . Sustained ventricular tachycardia ([place]) 07/27/2014 . Urinary retention 12/30/2014 Past Surgical History: Past Surgical History: Procedure Laterality Date . CARDIAC CATHETERIZATION  04/2010  LIMA to LAD patent,SVG to OM patent,no in-stnet restenosis RCA . CATARACT EXTRACTION W/ INTRAOCULAR LENS  IMPLANT, BILATERAL Bilateral 2012 . CORONARY ANGIOPLASTY WITH STENT PLACEMENT  07/2009  bare metal stent to SVG to the RCA . CORONARY ARTERY BYPASS GRAFT  1996  LIMA to LAD,SVG to RCA & SVG to OM . INSERT / REPLACE / REMOVE PACEMAKER  2010 . MELANOMA EXCISION  05/1974 X2  "taken off my back" (12/15/2012) . NM MYOVIEW LTD  06/2011  low  risk . PPM GENERATOR CHANGEOUT N/A 01/22/2017  Procedure: PPM GENERATOR CHANGEOUT;  Surgeon: [name], MD;  Location: [facility];  Service: Cardiovascular;  Laterality: N/A; . TONSILLECTOMY  1938 . TRANSURETHRAL RESECTION OF PROSTATE  1986 . US ECHOCARDIOGRAPHY  07/11/2009  EF 45-27% HPI: 84 year old male admitted 08/25/17 with pain and weakness, UTI. PMH: CAD/CABG, CHF, depression/anxiety, bradycardia, sick sinus syndrome, pacer, CVA, GERD, gout, heart murmer, [place] Spotted Fever, Melanoma, HTN, RLS, urinary retention, MI x2  Subjective: pt awake in bed, son in law and daughter Caryl Asp present Assessment / Plan / Recommendation CHL IP CLINICAL IMPRESSIONS 09/01/2017 Clinical Impression Pt with significantly improved swallow function compared to [facility] on Friday June 26th.  Mild dysphagia present without aspiration/penetration of any consistency tested (thin, nectar, pudding, cracker, barium tablet).  Mild weakness continues with mild residuals with intermittent sensation.  Pt able to clear residuals with cued dry swallow.  In addition, pharyngeal swallow triggered at pyriform sinus with thin = therefore rec pt sit fully upright for all po~  Of note, pt throat clearing during MBS without barium in larynx, he does appear with secretions retained in pharynx.  Will follow up for dysphagia management and RMST to address dysphagia/cough strength.  Family present and pt, and family educated to findings/recommendations during [place]. SLP Visit Diagnosis Dysphagia, oropharyngeal phase (R13.12) Attention and concentration deficit following -- Frontal lobe and executive function deficit following -- Impact on safety and function Mild aspiration risk   CHL IP TREATMENT RECOMMENDATION 09/01/2017 Treatment Recommendations Therapy as outlined in treatment plan below   Prognosis 09/01/2017 Prognosis for Safe Diet Advancement Good Barriers to Reach Goals -- Barriers/Prognosis Comment -- CHL IP DIET RECOMMENDATION 09/01/2017 SLP  Diet Recommendations Dysphagia 3 (Mech soft) solids;Thin liquid Liquid Administration via --  Medication Administration Whole meds with puree Compensations Slow rate;Small sips/bites;Follow solids with liquid Postural Changes Seated upright at 90 degrees;Remain semi-upright after after feeds/meals (Comment)   CHL IP OTHER RECOMMENDATIONS 09/01/2017 Recommended Consults -- Oral Care Recommendations Oral care BID Other Recommendations --   CHL IP FOLLOW UP RECOMMENDATIONS 09/01/2017 Follow up Recommendations Skilled Nursing facility   [facility] IP FREQUENCY AND DURATION 09/01/2017 Speech Therapy Frequency (ACUTE ONLY) min 1 x/week Treatment Duration 1 week      CHL IP ORAL PHASE 09/01/2017 Oral Phase WFL Oral - Pudding Teaspoon -- Oral - Pudding Cup -- Oral - Honey Teaspoon -- Oral - Honey Cup -- Oral - Nectar Teaspoon -- Oral - Nectar Cup WFL Oral - Nectar Straw -- Oral - Thin Teaspoon WFL Oral - Thin Cup WFL Oral - Thin Straw WFL Oral - Puree WFL Oral - Mech Soft WFL Oral - Regular -- Oral - Multi-Consistency -- Oral - Pill Delayed oral transit Oral Phase - Comment --  CHL IP PHARYNGEAL PHASE 09/01/2017 Pharyngeal Phase -- Pharyngeal- Pudding Teaspoon -- Pharyngeal -- Pharyngeal- Pudding Cup -- Pharyngeal -- Pharyngeal- Honey Teaspoon -- Pharyngeal -- Pharyngeal- Honey Cup -- Pharyngeal -- Pharyngeal- Nectar Teaspoon WFL Pharyngeal -- Pharyngeal- Nectar Cup Reduced tongue base retraction Pharyngeal -- Pharyngeal- Nectar Straw -- Pharyngeal -- Pharyngeal- Thin Teaspoon Delayed swallow initiation-pyriform sinuses;Reduced tongue base retraction Pharyngeal -- Pharyngeal- Thin Cup Delayed swallow initiation-pyriform sinuses;Reduced tongue base retraction Pharyngeal -- Pharyngeal- Thin Straw Delayed swallow initiation-pyriform sinuses;Reduced tongue base retraction Pharyngeal -- Pharyngeal- Puree Reduced tongue base retraction Pharyngeal -- Pharyngeal- Mechanical Soft WFL Pharyngeal -- Pharyngeal- Regular -- Pharyngeal -- Pharyngeal-  Multi-consistency -- Pharyngeal -- Pharyngeal- Pill WFL Pharyngeal -- Pharyngeal Comment --  CHL IP CERVICAL ESOPHAGEAL PHASE 09/01/2017 Cervical Esophageal Phase WFL Pudding Teaspoon -- Pudding Cup -- Honey Teaspoon -- Honey Cup -- Nectar Teaspoon -- Nectar Cup -- Nectar Straw -- Thin Teaspoon -- Thin Cup -- Thin Straw -- Puree -- Mechanical Soft -- Regular -- Multi-consistency -- Pill -- Cervical Esophageal Comment -- Macario Golds 09/01/2017, 7:45 PM  Luanna Salk, MS [facility] SLP [phone]             Dg Swallowing Func-speech Pathology  Result Date: 08/29/2017 Objective Swallowing Evaluation: Type of Study: MBS-Modified Barium Swallow Study  Patient Details Name: MONTGOMERY FAVOR MRN: [id number] Date of Birth: 06-10-1926 Today's Date: 08/29/2017 Time: SLP Start Time (ACUTE ONLY): 0850 -SLP Stop Time (ACUTE ONLY): 1015 SLP Time Calculation (min) (ACUTE ONLY): 85 min Past Medical History: Past Medical History: Diagnosis Date . Arthritis   "minor, back and sometimes knees" (12/15/2012) . Bradycardia   AFib/SSS s/p St Jude PPM 04/12/2008 . CAD (coronary artery disease) 12/30/2014  CABG (LIMA-LAD, SVG-RCA, SVG-OM in 1996).  07/2009 BMS to SVG-RCA. Cath in 04/2010 with patent stents  . Cardiomyopathy, ischemic 08/25/2012 . CHF (congestive heart failure) ([place])  . Chronic knee pain 12/03/2014 . Combined congestive systolic and diastolic heart failure ([place]) 02/02/2015  Hx EF 41%. BNP 96.8 02/21/15 Torsemide 04/06/15 Na 142, K 4.6, Bun 16, creat 0.89 04/20/14 BNP 111.7, Na 142, K 4.6, Bun 16, creat 0.9  . Depression with anxiety 02/02/2015  02/21/15 Hgb A1c 5.8 03/10/15 MMSE 30/30  . Dizziness, after diuretic asscoiated with hypotension and responded to fluid bolus 06/05/2011  04/28/15 US carotid R+L normal bilateral arterial velocities.   Marland Kitchen Dyspnea 08/11/2014  Followed in Pulmonary clinic/ [facility]/ [place]  - 08/11/2014  Walked RA x 1 laps @ 185 ft each stopped due  to fatigue/off balance/ slow pace/  no sob or desat  - PFT's   09/26/2014  FEV1 2.26 (85 % ) ratio 76  p no % improvement from saba with DLCO  67 % corrects to 93 % for alv volume      Since prev study 08/04/13 minimal change lung vol or dlco   . Embolic cerebral infarction ([place]) 12/06/2015 . Exertional shortness of breath   "sometimes walking" (12/15/2012) . GERD (gastroesophageal reflux disease)  . Gout 02/09/2015 . Heart murmur   "just told I had one today" (12/15/2012) . Hiatal hernia  . Hyperlipidemia  . Hypertension  . Hypothyroid  . Influenza A 03/10/2016 . Insomnia  . Melanoma of back ([place]) 1976 . Myocardial infarction [facility]) 1996; 2011  "both silent" (12/15/2012) . Nonrheumatic aortic valve stenosis  . Orthostatic hypotension  . Osteoporosis, senile  . Pacemaker  . RBBB  . Restless leg 02/02/2015 . Right leg weakness 12/06/2015 . [facility] spotted fever  . S/P CABG x 4  . Sick sinus syndrome ([place]) 01/31/2014 . Sustained ventricular tachycardia ([place]) 07/27/2014 . Urinary retention 12/30/2014 Past Surgical History: Past Surgical History: Procedure Laterality Date . CARDIAC CATHETERIZATION  04/2010  LIMA to LAD patent,SVG to OM patent,no in-stnet restenosis RCA . CATARACT EXTRACTION W/ INTRAOCULAR LENS  IMPLANT, BILATERAL Bilateral 2012 . CORONARY ANGIOPLASTY WITH STENT PLACEMENT  07/2009  bare metal stent to SVG to the RCA . CORONARY ARTERY BYPASS GRAFT  1996  LIMA to LAD,SVG to RCA & SVG to OM . INSERT / REPLACE / REMOVE PACEMAKER  2010 . MELANOMA EXCISION  05/1974 X2  "taken off my back" (12/15/2012) . NM MYOVIEW LTD  06/2011  low risk . PPM GENERATOR CHANGEOUT N/A 01/22/2017  Procedure: PPM GENERATOR CHANGEOUT;  Surgeon: [name], MD;  Location: [facility];  Service: Cardiovascular;  Laterality: N/A; . TONSILLECTOMY  1938 . TRANSURETHRAL RESECTION OF PROSTATE  1986 . US ECHOCARDIOGRAPHY  07/11/2009  EF 45-44% HPI: 84 year old male admitted 08/25/17 with pain and weakness, UTI. PMH: CAD/CABG, CHF, depression/anxiety, bradycardia, sick sinus syndrome, pacer, CVA,  GERD, gout, heart [place], [place] Spotted Fever, Melanoma, HTN, RLS, urinary retention, MI x2  Subjective: Pt and wife were seen in radiology for completion of MBS to objectively assess swallow function and safety. Pt was noted to clear his throat on a continuous basis prior to po trials. Significant pain reported - 8/10 at rest, 14/10 with swallow Assessment / Plan / Recommendation CHL IP CLINICAL IMPRESSIONS 08/29/2017 Clinical Impression Limited MBS completed, due to severity of pharyngeal dysphagia. Extensive education was provided before and after this study. Pt reports tolerance of regular diet and thin liquids prior to admit, without difficulty or odynophagia. At this time, pt reports generalized pain 8/10, with significant odynophagia (14/10). Prior to start of po presentations, pt was noted to exhibit a wet, thick voice quality and frequent throat clearing, raising concern for poor management of secretions. Suction was set up; oral care was completed to minimize bacterial load given high risk of aspiration. Oral care effectively removed thick secretions from soft palate and dried secretions from the oral cavity. Small (1/2  to 1 tsp) boluses of nectar thick liquid were provided individually via spoon, with discoordinated oral control and manipulation noted, as well as premature spillage over the tongue base, delayed swallow reflex, penetration and aspiration after the swallow, and post-swallow residue. Reflexive cough was elicited in response to aspiration. A small (1/8 tsp) bolus of puree was then given.  Following discoordinated oral prep and propulsion, the bolus was noted to spill to the pyriform sinus prior to triggering the swallow reflex. Aspiration of pyriform stasis was noted, with cough demonstrated. Study was terminated at this point given exceptionally high risk of aspiration. SLP reviewed MBS results and provided extensive education on the significance of pt's swallowing deficit, need for NPO  status at this time, and possible need to consider nonoral feeding methods.  Recommend consideration of Palliative Care consult to facilitate establishment of appropriate goals of care. ST will follow acutely for education, trial of dysphagia therapy (which may be a challenge given severity of odynophagia).  SLP Visit Diagnosis Dysphagia, pharyngeal phase (R13.13) Impact on safety and function Risk for inadequate nutrition/hydration;Severe aspiration risk   CHL IP TREATMENT RECOMMENDATION 08/29/2017 Treatment Recommendations Therapy as outlined in treatment plan below   Prognosis 08/29/2017 Prognosis for Safe Diet Advancement Fair Barriers to Reach Goals Advanced age, multiple comorbidities CHL IP DIET RECOMMENDATION 08/29/2017 SLP Diet Recommendations NPO; Alternative means - temporary   Medication Administration Via alternative means   Postural Changes Recommend HOB remain at 45 degrees at all times, given poor secretion management   CHL IP OTHER RECOMMENDATIONS 08/29/2017 Recommended Consults Palliative Care consult for [place] Oral Care Recommendations Oral care QID;Staff/trained caregiver to provide oral care Other Recommendations Have oral suction available     CHL IP FREQUENCY AND DURATION 08/29/2017 Speech Therapy Frequency (ACUTE ONLY) min 2x/week Treatment Duration 2 weeks;1 week      CHL IP ORAL PHASE 08/29/2017 Oral Phase Slow bolus formation, discoordinated posterior propulsion  CHL IP PHARYNGEAL PHASE 08/29/2017 Pharyngeal Phase Impaired Pharyngeal- Nectar Teaspoon Delayed swallow initiation-pyriform sinuses;Delayed swallow initiation-vallecula;Reduced pharyngeal peristalsis;Reduced epiglottic inversion;Reduced anterior laryngeal mobility;Reduced laryngeal elevation;Reduced airway/laryngeal closure;Reduced tongue base retraction;Penetration/Apiration after swallow;Trace aspiration;Pharyngeal residue - valleculae;Pharyngeal residue - posterior pharnyx;Pharyngeal residue - pyriform Pharyngeal Material enters  airway, passes BELOW cords and not ejected out despite cough attempt by patient Pharyngeal- Puree Delayed swallow initiation-vallecula;Delayed swallow initiation-pyriform sinuses;Reduced pharyngeal peristalsis;Reduced epiglottic inversion;Reduced airway/laryngeal closure;Reduced tongue base retraction;Reduced laryngeal elevation;Reduced anterior laryngeal mobility;Penetration/Apiration after swallow;Trace aspiration;Pharyngeal residue - pyriform;Pharyngeal residue - posterior pharnyx;Pharyngeal residue - valleculae Pharyngeal Material enters airway, passes BELOW cords and not ejected out despite cough attempt by patient  CHL IP CERVICAL ESOPHAGEAL PHASE 08/29/2017 Cervical Esophageal Phase Impaired Nectar Teaspoon Reduced cricopharyngeal relaxation Puree Reduced cricopharyngeal relaxation Celia B. Quentin [facility], CCC-SLP Speech Language Pathologist [phone] Shonna Chock 08/29/2017, 10:49 AM               Microbiology: Recent Results (from the past 240 hour(s))  MRSA PCR Screening     Status: Abnormal   Collection Time: 08/25/17  6:18 PM  Result Value Ref Range Status   MRSA by PCR POSITIVE (A) NEGATIVE Final    Comment:        The GeneXpert MRSA Assay (FDA approved for NASAL specimens only), is one component of a comprehensive MRSA colonization surveillance program. It is not intended to diagnose MRSA infection nor to guide or monitor treatment for MRSA infections. RESULT CALLED TO, READ BACK BY AND VERIFIED WITHTod Persia RN [phone] 08/25/17 A NAVARRO Performed at [facility], [place] [address] [place], [place] [address]   Culture, blood (routine x 2)     Status: None (Preliminary result)   Collection Time: 08/31/17  3:42 AM  Result Value Ref Range Status   Specimen Description   Final    BLOOD RIGHT ANTECUBITAL Performed at [facility]  [address], [place] [address]    Special Requests   Final    BOTTLES DRAWN AEROBIC AND ANAEROBIC  Blood Culture adequate volume Performed at [facility] [address], [place], [place] [address]    Culture   Final    NO GROWTH 4 DAYS Performed at [facility], [place] [address] [place], [place] [address]    Report Status PENDING  Incomplete  Culture, blood (routine x 2)     Status: None (Preliminary result)   Collection Time: 08/31/17  3:42 AM  Result Value Ref Range Status   Specimen Description   Final    BLOOD LEFT HAND Performed at [facility] [address] [place], [place] [address]    Special Requests   Final    BOTTLES DRAWN AEROBIC ONLY Blood Culture adequate volume Performed at [facility] [address], [place], [place] [address]    Culture   Final    NO GROWTH 4 DAYS Performed at [facility], [place] [address] [place],  [address]    Report Status PENDING  Incomplete     Labs: CBC: Recent Labs  Lab 08/29/17 1022 08/31/17 0342 09/04/17 1054  WBC 11.5* 8.1 12.2*  NEUTROABS  --   --  8.9*  HGB 13.7 12.4* 12.8*  HCT 40.5 38.4* 39.2  MCV 95.3 97.0 97.3  PLT [phone]   Basic Metabolic Panel: Recent Labs  Lab 08/29/17 1022 08/31/17 0342 09/04/17 1054  NA 140 139 140  K 3.8 3.7 3.4*  CL 106 106 105  CO2 '25 25 26  ' GLUCOSE 113* 120* 94  BUN '15 17 15  ' CREATININE 0.55* 0.51* 0.51*  CALCIUM 8.8* 8.3* 8.0*   Liver Function Tests: Recent Labs  Lab 09/04/17 1054  AST 33  ALT 45*  ALKPHOS 61  BILITOT 0.3  PROT 5.8*  ALBUMIN 2.4*   No results for input(s): LIPASE, AMYLASE in the last 168 hours. No results for input(s): AMMONIA in the last 168 hours. Cardiac Enzymes: No results for input(s): CKTOTAL, CKMB, CKMBINDEX, TROPONINI in the last 168 hours. BNP (last 3 results) Recent Labs    08/25/17 1813  BNP 378.4*   CBG: Recent Labs  Lab 08/30/17 0740 08/31/17 0747 09/01/17 0739 09/03/17 0743 09/04/17 0749  GLUCAP 112* 94 104* 86 89   Time spent: 35  minutes  Signed:  Berle Mull  [facility]  09/04/2017  , 3:22 PM

## 2017-09-05 ENCOUNTER — Telehealth: Payer: Self-pay

## 2017-09-05 LAB — CULTURE, BLOOD (ROUTINE X 2)
Culture: NO GROWTH
Culture: NO GROWTH
Special Requests: ADEQUATE
Special Requests: ADEQUATE

## 2017-09-05 NOTE — Telephone Encounter (Signed)
This is a patient of [place] [place], who was admitted to [facility] after hospitalization. [facility] F/U is needed. Hospital discharge from [facility] on 09/04/2017.

## 2017-09-08 ENCOUNTER — Non-Acute Institutional Stay (SKILLED_NURSING_FACILITY): Payer: Medicare Other | Admitting: Internal Medicine

## 2017-09-08 ENCOUNTER — Encounter: Payer: Self-pay | Admitting: Internal Medicine

## 2017-09-08 DIAGNOSIS — G2581 Restless legs syndrome: Secondary | ICD-10-CM

## 2017-09-08 DIAGNOSIS — R531 Weakness: Secondary | ICD-10-CM | POA: Diagnosis not present

## 2017-09-08 DIAGNOSIS — R7881 Bacteremia: Secondary | ICD-10-CM | POA: Diagnosis not present

## 2017-09-08 DIAGNOSIS — R5381 Other malaise: Secondary | ICD-10-CM

## 2017-09-08 DIAGNOSIS — B370 Candidal stomatitis: Secondary | ICD-10-CM

## 2017-09-08 DIAGNOSIS — E039 Hypothyroidism, unspecified: Secondary | ICD-10-CM

## 2017-09-08 DIAGNOSIS — M47812 Spondylosis without myelopathy or radiculopathy, cervical region: Secondary | ICD-10-CM | POA: Diagnosis not present

## 2017-09-08 DIAGNOSIS — R339 Retention of urine, unspecified: Secondary | ICD-10-CM

## 2017-09-08 DIAGNOSIS — I5042 Chronic combined systolic (congestive) and diastolic (congestive) heart failure: Secondary | ICD-10-CM

## 2017-09-08 DIAGNOSIS — R1313 Dysphagia, pharyngeal phase: Secondary | ICD-10-CM | POA: Insufficient documentation

## 2017-09-08 DIAGNOSIS — I2510 Atherosclerotic heart disease of native coronary artery without angina pectoris: Secondary | ICD-10-CM

## 2017-09-08 DIAGNOSIS — G903 Multi-system degeneration of the autonomic nervous system: Secondary | ICD-10-CM

## 2017-09-08 LAB — BASIC METABOLIC PANEL
BUN: 14 (ref 4–21)
Calcium: 8.6
Creat: 0.61
EGFR (Non-African Amer.): 89
Glucose: 86
Potassium: 4.4
Sodium: 137

## 2017-09-08 LAB — CBC
HCT: 38.9
HGB: 12.9
MCV: 93.5
WBC: 9.5
platelet count: 322

## 2017-09-08 LAB — SEDIMENTATION RATE: Sedimentation Rate-Westergren: 34

## 2017-09-08 LAB — C-REACTIVE PROTEIN: CRP: 16.4

## 2017-09-08 NOTE — Progress Notes (Signed)
Provider:  Blanchie Serve MD  Location:  [facility] Room Number: 34 Place of Service:  SNF (31)  PCP: Blanchie Serve, MD Patient Care Team: Blanchie Serve, MD as PCP - General (Internal Medicine) Croitoru, Dani Gobble, MD as Attending Physician (Cardiology) Vevelyn Royals, MD as Consulting Physician (Ophthalmology) Franchot Gallo, MD as Consulting Physician (Urology) Tanda Rockers, MD as Consulting Physician (Pulmonary Disease) Allyn Kenner, MD (Dermatology) Deliah Goody, PA-C as Physician Assistant (Physician Assistant) Ngetich, Nelda Bucks, NP as Nurse Practitioner (Family Medicine)  Extended Emergency Contact Information Primary Emergency Contact: Timme,[name] Address: [address]          [place] Phone: [phone] Mobile Phone: [phone] Relation: Spouse Secondary Emergency Contact: [name],Kantor  [place] Mobile Phone: [phone] Relation: Daughter  Code Status: full code  Goals of Care: Advanced Directive information Advanced Directives 09/08/2017  Does Patient Have a Medical Advance Directive? Yes  Type of Paramedic of [place];Living will  Does patient want to make changes to medical advance directive? No - Patient declined  Copy of [place] in Chart? Yes  Would patient like information on creating a medical advance directive? -  Pre-existing out of facility DNR order (yellow form or pink MOST form) -      Chief Complaint  Patient presents with  . Readmit To SNF    Readmission Visit     HPI: Patient is a 84 y.o. male seen today for re-admission visit. He was sent to the hospital with severe neck pain and trouble swallowing along with generalized myalgia and involuntary movements. He was in the hospital from 08/25/17-09/04/17. His workup in the hospital revealed UTI with E.coli and Klebsiella Oxytoca in urine culture and klebsiella oxytoca  in blood culture. ID was consulted with bacteremia. With his pacemaker present, ID further recommended TEE and EP consultation. TEE was deferred due to his severe dysphagia and neck pain. His repeat blood culture was negative. GI was consulted with dysphagia but given his medical history and abrupt onset of symptom, this was thought to be neurologic/ musculoskeletal and EGD was deferred. SLP was consult and he is now on dysphagia diet. ID recommended 2 weeks course of antibiotics which he completed on 09/07/17. Given his medical co-morbidities, palliative care was consulted. Patient remains a full code with full scope of treatment. CT head showed chronic ischemic microangiopathy. CT cervical spine showed no acute fracture but multilevel moderate to severe facet arthrosis with spondylolisthesis. Neurosurgery was consulted over the phone and recommended outpatient follow up. He is seen in his room today.  Past Medical History:  Diagnosis Date  . Arthritis    "minor, back and sometimes knees" (12/15/2012)  . Bradycardia    AFib/SSS s/p St Jude PPM 04/12/2008  . CAD (coronary artery disease) 12/30/2014   CABG (LIMA-LAD, SVG-RCA, SVG-OM in 1996).  07/2009 BMS to SVG-RCA. Cath in 04/2010 with patent stents   . Cardiomyopathy, ischemic 08/25/2012  . CHF (congestive heart failure) ([place])   . Chronic knee pain 12/03/2014  . Combined congestive systolic and diastolic heart failure ([place]) 02/02/2015   Hx EF 41%. BNP 96.8 02/21/15 Torsemide 04/06/15 Na 142, K 4.6, Bun 16, creat 0.89 04/20/14 BNP 111.7, Na 142, K 4.6, Bun 16, creat 0.9   . Depression with anxiety 02/02/2015   02/21/15 Hgb A1c 5.8 03/10/15 MMSE 30/30   . Dizziness, after diuretic asscoiated with hypotension and responded to fluid bolus 06/05/2011  04/28/15 US carotid R+L normal bilateral arterial velocities.    Marland Kitchen Dyspnea 08/11/2014   Followed in Pulmonary clinic/ [facility]/ [place]  - 08/11/2014  Walked RA x 1 laps @ 185 ft each stopped due to fatigue/off  balance/ slow pace/  no sob or desat  - PFT's  09/26/2014  FEV1 2.26 (85 % ) ratio 76  p no % improvement from saba with DLCO  67 % corrects to 93 % for alv volume      Since prev study 08/04/13 minimal change lung vol or dlco    . Embolic cerebral infarction ([place]) 12/06/2015  . Exertional shortness of breath    "sometimes walking" (12/15/2012)  . GERD (gastroesophageal reflux disease)   . Gout 02/09/2015  . Heart murmur    "just told I had one today" (12/15/2012)  . Hiatal hernia   . Hyperlipidemia   . Hypertension   . Hypothyroid   . Influenza A 03/10/2016  . Insomnia   . Melanoma of back ([place]) 1976  . Myocardial infarction [facility]) 1996; 2011   "both silent" (12/15/2012)  . Nonrheumatic aortic valve stenosis   . Orthostatic hypotension   . Osteoporosis, senile   . Pacemaker   . RBBB   . Restless leg 02/02/2015  . Right leg weakness 12/06/2015  . [facility] spotted fever   . S/P CABG x 4   . Sick sinus syndrome ([place]) 01/31/2014  . Sustained ventricular tachycardia ([place]) 07/27/2014  . Urinary retention 12/30/2014   Past Surgical History:  Procedure Laterality Date  . CARDIAC CATHETERIZATION  04/2010   LIMA to LAD patent,SVG to OM patent,no in-stnet restenosis RCA  . CATARACT EXTRACTION W/ INTRAOCULAR LENS  IMPLANT, BILATERAL Bilateral 2012  . CORONARY ANGIOPLASTY WITH STENT PLACEMENT  07/2009   bare metal stent to SVG to the RCA  . CORONARY ARTERY BYPASS GRAFT  1996   LIMA to LAD,SVG to RCA & SVG to OM  . INSERT / REPLACE / REMOVE PACEMAKER  2010  . MELANOMA EXCISION  05/1974 X2   "taken off my back" (12/15/2012)  . NM MYOVIEW LTD  06/2011   low risk  . PPM GENERATOR CHANGEOUT N/A 01/22/2017   Procedure: PPM GENERATOR CHANGEOUT;  Surgeon: [name], MD;  Location: [facility];  Service: Cardiovascular;  Laterality: N/A;  . TONSILLECTOMY  1938  . TRANSURETHRAL RESECTION OF PROSTATE  1986  . US ECHOCARDIOGRAPHY  07/11/2009   EF 45-50%    reports that he has never  smoked. He has never used smokeless tobacco. He reports that he does not drink alcohol or use drugs. Social History   Socioeconomic History  . Marital status: Married    Spouse name: Not on file  . Number of children: 2  . Years of education: Masters  . Highest education level: Not on file  Occupational History  . Occupation: Retired Company secretary -Pensions consultant  Social Needs  . Financial resource strain: Not on file  . Food insecurity:    Worry: Not on file    Inability: Not on file  . Transportation needs:    Medical: Not on file    Non-medical: Not on file  Tobacco Use  . Smoking status: Never Smoker  . Smokeless tobacco: Never Used  Substance and Sexual Activity  . Alcohol use: No    Alcohol/week: 0.0 oz  . Drug use: No  . Sexual activity: Never  Lifestyle  . Physical activity:    Days per week: Not on file  Minutes per session: Not on file  . Stress: Not on file  Relationships  . Social connections:    Talks on phone: Not on file    Gets together: Not on file    Attends religious service: Not on file    Active member of club or organization: Not on file    Attends meetings of clubs or organizations: Not on file    Relationship status: Not on file  . Intimate partner violence:    Fear of current or ex partner: Not on file    Emotionally abused: Not on file    Physically abused: Not on file    Forced sexual activity: Not on file  Other Topics Concern  . Not on file  Social History Narrative   Lives at [place] to [place] 01/09/15   Married - Violet   Never smoked   Alcohol none   Previously employed as Scientist, forensic for [employer].         Diet:Low sodium   Do you drink/eat things with caffeine? No   Marital status: Married                              What year were you married?1950   Do you live in a house, apartment, assisted living, condo, trailer, etc)?    Is it one or more stories? 1   How many persons live in your home? 2    Do you have any pets in your home? No   Current or past profession: Minister, [employer]   Do you exercise?     Very Little                                                 Type & how often:    Do you have a living will?  Yes   Do you have a DNR Form? Yes   Do you have a POA/HPOA forms? Yes    Functional Status Survey:    Family History  Problem Relation Age of Onset  . Coronary artery disease Mother   . Diabetes Mother   . Heart disease Mother   . Coronary artery disease Father   . Diabetes Father   . Lung cancer Father   . Heart disease Father   . Anuerysm Son   . Heart disease Brother     Health Maintenance  Topic Date Due  . PNA vac Low Risk Adult (2 of 2 - PCV13) 10/06/2010  . INFLUENZA VACCINE  09/04/2017  . TETANUS/TDAP  08/01/2026 (Originally 08/01/2024)  . DEXA SCAN  12/14/2024    Allergies  Allergen Reactions  . Altace [Ramipril] Cough  . Crestor [Rosuvastatin Calcium] Rash  . Penicillins Rash and Other (See Comments)    Has patient had a PCN reaction causing immediate rash, facial/tongue/throat swelling, SOB or lightheadedness with hypotension: Yes Has patient had a PCN reaction causing severe rash involving mucus membranes or skin necrosis: No Has patient had a PCN reaction that required hospitalization No Has patient had a PCN reaction occurring within the last 10 years: No If all of the above answers are "NO", then may proceed with Cephalosporin use.   . Sulfa Antibiotics Rash    Outpatient Encounter Medications as of 09/08/2017  Medication Sig  . acetaminophen (  TYLENOL) 325 MG tablet Take 650 mg by mouth every 8 (eight) hours as needed for mild pain, moderate pain or headache.   Marland Kitchen amiodarone (PACERONE) 100 MG tablet Take 1 tablet (100 mg total) by mouth daily.  Marland Kitchen aspirin EC 81 MG tablet Take 1 tablet (81 mg total) by mouth daily.  . Cholecalciferol (VITAMIN D3) 5000 UNITS CAPS Take 5,000 Units by mouth every Monday.   . clopidogrel (PLAVIX) 75  MG tablet TAKE 1 TABLET ONCE DAILY.  Marland Kitchen Droxidopa 100 MG CAPS Take 100 mg by mouth 3 (three) times daily.  Marland Kitchen guaiFENesin (ROBITUSSIN) 100 MG/5ML SOLN Take 5 mLs (100 mg total) by mouth every 4 (four) hours as needed for cough or to loosen phlegm.  Marland Kitchen HYDROcodone-acetaminophen (NORCO/VICODIN) 5-325 MG tablet Take 1 tablet by mouth every 4 (four) hours as needed for severe pain.  Marland Kitchen levothyroxine (SYNTHROID, LEVOTHROID) 100 MCG tablet Take 100 mcg by mouth daily before breakfast.  . Lidocaine 4 % PTCH Apply 1 patch topically daily. Left shoulder  . meclizine (ANTIVERT) 25 MG tablet Take 1 tablet (25 mg total) by mouth 2 (two) times daily as needed for dizziness.  . Melatonin 3 MG TABS Take 3 mg by mouth at bedtime.  . midodrine (PROAMATINE) 10 MG tablet TAKE 1 TABLET THREE TIMES DAILY. TAKE LAST DOSE AT LEAST 6 HOURS BEFORE BEDTIME.  . pantoprazole (PROTONIX) 40 MG tablet TAKE 1 TABLET BY MOUTH TWICE DAILY.  Marland Kitchen polyethylene glycol (MIRALAX / GLYCOLAX) packet Take 17 g by mouth daily. Hold for loose stool  . pravastatin (PRAVACHOL) 40 MG tablet Take 1 tablet (40 mg total) by mouth at bedtime.  . predniSONE (DELTASONE) 10 MG tablet Take 39m daily for 3days,Take 176mdaily for 3days, then stop.  . Marland KitchenOPINIRole (REQUIP) 0.5 MG tablet TAKE 1 TABLET 3 TIMES A DAY AS NEEDED.  . Marland Kitchenennosides-docusate sodium (SENOKOT-S) 8.6-50 MG tablet Take 2 tablets by mouth at bedtime.   . torsemide (DEMADEX) 10 MG tablet Take 10 mg by mouth daily.   . traZODone (DESYREL) 50 MG tablet Take 1 tablet (50 mg total) by mouth at bedtime.   No facility-administered encounter medications on file as of 09/08/2017.     Review of Systems  Constitutional: Positive for fatigue. Negative for appetite change, chills, diaphoresis and fever.  HENT: Positive for hearing loss, mouth sores, sore throat and trouble swallowing. Negative for congestion, ear pain, postnasal drip, rhinorrhea and sinus pressure.   Eyes: Negative for pain and itching.   Respiratory: Positive for cough and shortness of breath. Negative for wheezing.        Has dyspnea even at rest  Cardiovascular: Negative for chest pain, palpitations and leg swelling.  Gastrointestinal: Negative for abdominal pain, constipation, diarrhea, nausea and vomiting.       Last bowel movement was 2 days back  Genitourinary: Negative for flank pain and hematuria.       Has indwelling foley catheter  Musculoskeletal: Positive for arthralgias, back pain, gait problem, neck pain and neck stiffness. Negative for joint swelling.  Skin: Negative for rash.  Neurological: Positive for weakness and light-headedness. Negative for numbness and headaches.  Hematological: Bruises/bleeds easily.  Psychiatric/Behavioral: Negative for behavioral problems and confusion.    Vitals:   09/08/17 1043  BP: 115/71  Pulse: 69  Resp: 20  Temp: 97.6 F (36.4 C)  TempSrc: Oral  SpO2: 94%  Weight: 171 lb (77.6 kg)  Height: _0  (1.778 m)   Body mass index is 24.54 kg/m.  Wt Readings from Last 3 Encounters:  09/08/17 171 lb (77.6 kg)  09/03/17 169 lb 4.8 oz (76.8 kg)  08/25/17 172 lb 3.2 oz (78.1 kg)    Physical Exam  Constitutional: He is oriented to person, place, and time. He appears well-developed. No distress.  Chronically ill appearing, frail, elderly male  HENT:  Head: Normocephalic and atraumatic.  Nose: Nose normal.  Mouth/Throat: No oropharyngeal exudate.  Moist mucus membrane, white patch to back of his throat  Eyes: Pupils are equal, round, and reactive to light. Conjunctivae and EOM are normal. Right eye exhibits no discharge. Left eye exhibits no discharge.  Neck: Neck supple.  Restricted range of motion, refuses to flex or extend his neck due to pain, limited rotation to the side, cervical and paraspinal tenderness present with spasticity   Cardiovascular: Normal rate and regular rhythm.  Murmur heard. Left chest wall pacemaker  Pulmonary/Chest: Effort normal and  breath sounds normal. No respiratory distress. He has no wheezes. He has no rales.  Abdominal: Soft. Bowel sounds are normal. There is no tenderness. There is no guarding.  Indwelling foley catheter, clear urine in bag  Musculoskeletal: He exhibits no edema.  Ted stockings to both legs, no leg edema, can move all 4 extremities, generalized weakness most prominent to LUE  Lymphadenopathy:    He has no cervical adenopathy.  Neurological: He is alert and oriented to person, place, and time. He exhibits normal muscle tone.  Skin: Skin is warm and dry. He is not diaphoretic.  Bruises to both arms, iv access to left arm  Psychiatric: He has a normal mood and affect.    Labs reviewed: Basic Metabolic Panel: Recent Labs    08/26/17 0426 08/27/17 0423 08/29/17 1022 08/31/17 0342 09/04/17 1054  NA 139 142 140 139 140  K 4.2 3.8 3.8 3.7 3.4*  CL 107 108 106 106 105  CO2 _0 GLUCOSE 104* 109* 113* 120* 94  BUN _1 CREATININE 0.49* 0.57* 0.55* 0.51* 0.51*  CALCIUM 8.6* 8.5* 8.8* 8.3* 8.0*  MG 2.2 2.1  --   --   --   PHOS 3.4  --   --   --   --    Liver Function Tests: Recent Labs    08/26/17 0426 08/27/17 0423 09/04/17 1054  AST 24 22 33  ALT 15 17 45*  ALKPHOS 62 59 61  BILITOT 1.1 0.6 0.3  PROT 6.3* 6.2* 5.8*  ALBUMIN 2.7* 2.5* 2.4*   No results for input(s): LIPASE, AMYLASE in the last 8760 hours. No results for input(s): AMMONIA in the last 8760 hours. CBC: Recent Labs    04/07/17 2358  08/27/17 0423 08/29/17 1022 08/31/17 0342 09/04/17 1054  WBC 8.6   < > 8.9 11.5* 8.1 12.2*  NEUTROABS 6.2  --  6.5  --   --  8.9*  HGB 13.7   < > 12.1* 13.7 12.4* 12.8*  HCT 41.1   < > 36.9* 40.5 38.4* 39.2  MCV 97.4   < > 97.1 95.3 97.0 97.3  PLT 230   < > 243 322 339 354   < > = values in this interval not displayed.   Cardiac Enzymes: Recent Labs    08/26/17 1439 08/26/17 2136 08/27/17 0423  CKTOTAL 102  --   --   TROPONINI 0.03* 0.03* 0.03*    BNP: Invalid input(s): POCBNP Lab Results  Component Value Date   HGBA1C 5.6 12/07/2015  Lab Results  Component Value Date   TSH 1.125 08/26/2017   No results found for: VITAMINB12 No results found for: FOLATE Lab Results  Component Value Date   FERRITIN 88 10/11/2016    Imaging and Procedures obtained prior to SNF admission: Dg Chest 2 View  Result Date: 08/25/2017 CLINICAL DATA:  Weakness. EXAM: CHEST - 2 VIEW COMPARISON:  11/27/2016 FINDINGS: There is a large chronic hiatal hernia. There is slight atelectasis at the lung bases but the patient has taken a very shallow inspiration. No discrete infiltrates or effusions. Heart size is normal. CABG. Pacemaker in place. No acute bone abnormality. IMPRESSION: Mild bibasilar atelectasis, at least in part due to a shallow inspiration. Large chronic hiatal hernia. Electronically Signed   By: [name] M.D.   On: 08/25/2017 13:13   Dg Shoulder Right  Result Date: 08/25/2017 CLINICAL DATA:  84 year old male with bilateral shoulder pain. No injury. Initial encounter. EXAM: RIGHT SHOULDER - 2+ VIEW COMPARISON:  None. FINDINGS: Mild to moderate acromioclavicular joint and acromial humeral joint degenerative changes. No fracture or dislocation. Visualized lungs clear. IMPRESSION: Mild to moderate acromioclavicular joint and acromial humeral joint degenerative changes. Electronically Signed   By: Genia Del M.D.   On: 08/25/2017 18:58   Ct Head Wo Contrast  Result Date: 08/25/2017 CLINICAL DATA:  Altered mental status and possible fall. EXAM: CT HEAD WITHOUT CONTRAST CT CERVICAL SPINE WITHOUT CONTRAST TECHNIQUE: Multidetector CT imaging of the head and cervical spine was performed following the standard protocol without intravenous contrast. Multiplanar CT image reconstructions of the cervical spine were also generated. COMPARISON:  Head CT 01/11/2016 FINDINGS: CT HEAD FINDINGS Brain: There is no mass, hemorrhage or extra-axial collection.  There is generalized atrophy without lobar predilection. There is no acute or chronic infarction. There is hypoattenuation of the periventricular white matter, most commonly indicating chronic ischemic microangiopathy. Vascular: Atherosclerotic calcification of the internal carotid arteries at the skull base. No abnormal hyperdensity of the major intracranial arteries or dural venous sinuses. Skull: The visualized skull base, calvarium and extracranial soft tissues are normal. Sinuses/Orbits: No fluid levels or advanced mucosal thickening of the visualized paranasal sinuses. No mastoid or middle ear effusion. The orbits are normal. CT CERVICAL SPINE FINDINGS Alignment: Grade 1 anterolisthesis at C3-C4 and C4-C5. This is secondary to facet hypertrophy. Alignment is otherwise normal. Skull base and vertebrae: No acute fracture. Soft tissues and spinal canal: No prevertebral fluid or swelling. No visible canal hematoma. Disc levels: Fusion of the left C3-4 facet joint. Facet arthrosis is most severe at right C4-C6. No spinal canal stenosis. Upper chest: No pneumothorax, pulmonary nodule or pleural effusion. Other: Normal visualized paraspinal cervical soft tissues. IMPRESSION: 1. Chronic ischemic microangiopathy and generalized volume loss without acute intracranial abnormality. 2. No acute fracture of the cervical spine. 3. Multilevel moderate-to-severe facet arthrosis with resultant spondylolisthesis. Electronically Signed   By: Ulyses Jarred M.D.   On: 08/25/2017 13:53   Ct Cervical Spine Wo Contrast  Result Date: 08/25/2017 CLINICAL DATA:  Altered mental status and possible fall. EXAM: CT HEAD WITHOUT CONTRAST CT CERVICAL SPINE WITHOUT CONTRAST TECHNIQUE: Multidetector CT imaging of the head and cervical spine was performed following the standard protocol without intravenous contrast. Multiplanar CT image reconstructions of the cervical spine were also generated. COMPARISON:  Head CT 01/11/2016 FINDINGS: CT  HEAD FINDINGS Brain: There is no mass, hemorrhage or extra-axial collection. There is generalized atrophy without lobar predilection. There is no acute or chronic infarction. There is hypoattenuation of the  periventricular white matter, most commonly indicating chronic ischemic microangiopathy. Vascular: Atherosclerotic calcification of the internal carotid arteries at the skull base. No abnormal hyperdensity of the major intracranial arteries or dural venous sinuses. Skull: The visualized skull base, calvarium and extracranial soft tissues are normal. Sinuses/Orbits: No fluid levels or advanced mucosal thickening of the visualized paranasal sinuses. No mastoid or middle ear effusion. The orbits are normal. CT CERVICAL SPINE FINDINGS Alignment: Grade 1 anterolisthesis at C3-C4 and C4-C5. This is secondary to facet hypertrophy. Alignment is otherwise normal. Skull base and vertebrae: No acute fracture. Soft tissues and spinal canal: No prevertebral fluid or swelling. No visible canal hematoma. Disc levels: Fusion of the left C3-4 facet joint. Facet arthrosis is most severe at right C4-C6. No spinal canal stenosis. Upper chest: No pneumothorax, pulmonary nodule or pleural effusion. Other: Normal visualized paraspinal cervical soft tissues. IMPRESSION: 1. Chronic ischemic microangiopathy and generalized volume loss without acute intracranial abnormality. 2. No acute fracture of the cervical spine. 3. Multilevel moderate-to-severe facet arthrosis with resultant spondylolisthesis. Electronically Signed   By: Ulyses Jarred M.D.   On: 08/25/2017 13:53   Dg Shoulder Left  Result Date: 08/25/2017 CLINICAL DATA:  84 year old male with bilateral shoulder pain. No injury. Initial encounter. EXAM: LEFT SHOULDER - 2+ VIEW COMPARISON:  09/25/2006 MR. FINDINGS: Moderate acromioclavicular joint degenerative changes. Superiorly located humeral head consistent with rotator cuff tear with acromial humeral joint degenerative  changes. No fracture or dislocation. Pacemaker in place. Visualized lungs clear. IMPRESSION: Moderate acromioclavicular joint degenerative changes. Superiorly located humeral head consistent with rotator cuff tear with acromial humeral joint degenerative changes. Electronically Signed   By: Genia Del M.D.   On: 08/25/2017 19:00   09/02/17 echocardiogram-  Study Conclusions   - Left ventricle: The cavity size was normal. Wall thickness was   increased in a pattern of severe LVH. Systolic function was   moderately reduced. The estimated ejection fraction was in the   range of 35% to 40%. Akinesis of the basalinferior myocardium.   Doppler parameters are consistent with a reversible restrictive   pattern, indicative of decreased left ventricular diastolic   compliance and/or increased left atrial pressure (grade 3   diastolic dysfunction). - Aortic valve: There was moderate stenosis. There was mild   regurgitation. Valve area (VTI): 1.17 cm^2. Valve area (Vmax):   1.22 cm^2. Valve area (Vmean): 1.34 cm^2.   Assessment/Plan  1. Generalized weakness Will have him work with physical therapy and occupational therapy team to help with gait training and muscle strengthening exercises.fall precautions. Skin care. Encourage to be out of bed. With his comorbidities and overall poor prognosis, will obtain palliative care consult to review goals of care. Discussed this over the phone with pt's daughter Caryl Asp in great length.   2. Physical deconditioning With recent bacteremia, neck pain from spondylolisthesis and his medical co-morbidities. Will have patient work with PT/OT as tolerated to regain strength and restore function.  Fall precautions are in place.  3. Bacteremia Completed his ceftriaxone yesterday. Remove iv line. Check cbc and cmp with ESR and CRP 09/11/17. Check blood culture 2 sets in 1 week. Monitor clinically.   4. Spondylosis of cervical region without myelopathy or radiculopathy CT  cervical spine showed grade 1 anterolisthesis at C3-4 and C4-5, fusion of left C3-4 facet joint and facet arthrosis at right C4-6, no spinal canal stenosis. Continue and complete tapering course of prednisone. With his pain and spasm, currently on lidocaine patch with norco 5-325 mg q4h prn and  tylenol 650 mg q8h prn. Change tylenol to 1000 mg tid. Continue norco prn. Add robaxin 250 mg bid for 3 days, then daily at bedtime x 2 weeks, then only as needed and monitor. Make neurosurgery consult. Work with PT and OT services.   5. Coronary artery disease involving native coronary artery of native heart without angina pectoris Chest pain free. Continue aspirin and plavix. Not on b blocker or ARB/ACEI with hypotension. Continue pravastatin  6. Chronic combined systolic and diastolic congestive heart failure (HCC) Reviewed echocardiogram. Continue torsemide 5 mg daily.   7. Neurogenic orthostatic hypotension (HCC) Continue droxidopa 100 mg tid and his midodrine  8. Hypothyroidism, unspecified type Lab Results  Component Value Date   TSH 1.125 08/26/2017   Continue levothyroxine  9. Urinary retention Continue foley catheter and foley care  10. RLS (restless legs syndrome) Continue requip as needed, no change to dosing  11. Oral thrush Oral care after each meal. Nystatin swish and spit TID for 2 weeks.   12. Pharyngeal dysphagia Continue pantoprazole for GERD. Continue dysphagia 3 diet, aspiration precautions, SLP consult.    Family/ staff Communication: reviewed care plan with patient, his daughter Caryl Asp and Camera operator.    Labs/tests ordered: cbc, CMP, ESR, CRP, blood culture, neurosurgery consult, PT, OT, SLP  Blanchie Serve, MD Internal Medicine [place], [place] [address] Cell Phone (Monday-Friday 8 am - 5 pm): [phone] On Call: [phone] and follow prompts after 5 pm and on weekends Office Phone:  [phone] Office Fax: [phone]

## 2017-09-09 ENCOUNTER — Encounter: Payer: Self-pay | Admitting: *Deleted

## 2017-09-10 ENCOUNTER — Telehealth: Payer: Self-pay | Admitting: *Deleted

## 2017-09-10 ENCOUNTER — Non-Acute Institutional Stay: Payer: Self-pay | Admitting: Nurse Practitioner

## 2017-09-10 NOTE — Telephone Encounter (Signed)
He just completed a course of prednisone. I would not recommend another round at present. I want him to be seen by neurosurgery team.

## 2017-09-10 NOTE — Telephone Encounter (Signed)
Daughter notified and verbalized understanding.  

## 2017-09-10 NOTE — Telephone Encounter (Signed)
Daughter called and asked about increasing or continuing the prednisone. She said he seemed to feel much better and was able to ambulate slightly while taking it. I advised it wasn't good for long-term use but that I would ask and let her know.

## 2017-09-11 ENCOUNTER — Encounter: Payer: Self-pay | Admitting: Nurse Practitioner

## 2017-09-11 ENCOUNTER — Non-Acute Institutional Stay: Payer: Medicare Other | Admitting: Nurse Practitioner

## 2017-09-11 ENCOUNTER — Other Ambulatory Visit: Payer: Self-pay | Admitting: Internal Medicine

## 2017-09-11 DIAGNOSIS — R29898 Other symptoms and signs involving the musculoskeletal system: Secondary | ICD-10-CM

## 2017-09-11 DIAGNOSIS — F339 Major depressive disorder, recurrent, unspecified: Secondary | ICD-10-CM

## 2017-09-11 DIAGNOSIS — F419 Anxiety disorder, unspecified: Secondary | ICD-10-CM

## 2017-09-11 DIAGNOSIS — M542 Cervicalgia: Secondary | ICD-10-CM

## 2017-09-11 DIAGNOSIS — Z515 Encounter for palliative care: Secondary | ICD-10-CM

## 2017-09-11 LAB — CBC
HCT: 38.8
HGB: 12.8
MCV: 94.6
WBC: 7.6
platelet count: 300

## 2017-09-11 LAB — COMPLETE METABOLIC PANEL WITH GFR
ALT: 25
AST: 20
Albumin: 3
Alkaline Phosphatase: 76
BUN: 17 (ref 4–21)
Calcium: 8.2
Creat: 0.59
EGFR (Non-African Amer.): 90
Glucose: 88
Potassium: 4.2
Sodium: 136
Total Bilirubin: 0.3
Total Protein: 5.8 g/dL

## 2017-09-11 LAB — SEDIMENTATION RATE: Sedimentation Rate-Westergren: 28

## 2017-09-11 NOTE — Progress Notes (Signed)
PALLIATIVE CARE CONSULT VISIT   PATIENT NAME: Adam [name] DOB: 1926/10/06 MRN: [id number]  PRIMARY CARE PROVIDER:   Blanchie Serve, MD  REFERRING PROVIDER:  Blanchie Serve, MD [place], [place] [address]  RESPONSIBLE PARTY:  Gross,[name] Address: [address]          [place] Phone: [phone] Mobile Phone: [phone] Relation: Spouse Secondary Emergency Contact: [name],Gauger  [place] Mobile Phone: [phone] Relation: Daughter   ASSESSMENT:        RECOMMENDATIONS and PLAN:  Neck Pain 2/2 Spondylosis of cervical spine  -Lower ext weakness -spoke with primary MD;patient has appointment with Neurosurgeon -continue current pain regimen -patient with generalized deconditioning due to hospitalization -continue PT  Chronic medical problems including: hypothyroidism, pAfib, CAD, MI s/p stent, combined CHF emboli CVA, GERD, constipation, HLD, orthostatic hypotension,restless leg,insomnia -managed by primary care physician  Depression and anxiety -patient reports mood is stable  ACP -talked with patient and wife regarding code status and MOST-will attempt to have meeting with daughter present.    I spent 60 minutes providing this consultation,  from 2:30 to 3:30. More than 50% of the time in this consultation was spent coordinating communication.   HISTORY OF PRESENT ILLNESS:  Adam [name] is a 84 y.o. year old male with multiple medical problems: constipation including acute pain due to cervical spondylosis, lower ext weakness, deconditioning, CAD, CHF depression, anxiety, . Palliative Care was asked to assist with symptom management and to address goals of care.   CODE STATUS: FULL  PPS: 40% HOSPICE ELIGIBILITY/DIAGNOSIS: TBD  PAST MEDICAL HISTORY:  Past Medical History:  Diagnosis Date  . Arthritis    "minor, back and sometimes knees" (12/15/2012)  . Bradycardia    AFib/SSS s/p St Jude PPM  04/12/2008  . CAD (coronary artery disease) 12/30/2014   CABG (LIMA-LAD, SVG-RCA, SVG-OM in 1996).  07/2009 BMS to SVG-RCA. Cath in 04/2010 with patent stents   . Cardiomyopathy, ischemic 08/25/2012  . CHF (congestive heart failure) ([place])   . Chronic knee pain 12/03/2014  . Combined congestive systolic and diastolic heart failure ([place]) 02/02/2015   Hx EF 41%. BNP 96.8 02/21/15 Torsemide 04/06/15 Na 142, K 4.6, Bun 16, creat 0.89 04/20/14 BNP 111.7, Na 142, K 4.6, Bun 16, creat 0.9   . Depression with anxiety 02/02/2015   02/21/15 Hgb A1c 5.8 03/10/15 MMSE 30/30   . Dizziness, after diuretic asscoiated with hypotension and responded to fluid bolus 06/05/2011   04/28/15 US carotid R+L normal bilateral arterial velocities.    Marland Kitchen Dyspnea 08/11/2014   Followed in Pulmonary clinic/ [facility]/ [place]  - 08/11/2014  Walked RA x 1 laps @ 185 ft each stopped due to fatigue/off balance/ slow pace/  no sob or desat  - PFT's  09/26/2014  FEV1 2.26 (85 % ) ratio 76  p no % improvement from saba with DLCO  67 % corrects to 93 % for alv volume      Since prev study 08/04/13 minimal change lung vol or dlco    . Embolic cerebral infarction ([place]) 12/06/2015  . Exertional shortness of breath    "sometimes walking" (12/15/2012)  . GERD (gastroesophageal reflux disease)   . Gout 02/09/2015  . Heart murmur    "just told I had one today" (12/15/2012)  . Hiatal hernia   . Hyperlipidemia   . Hypertension   . Hypothyroid   . Influenza A 03/10/2016  . Insomnia   .  Melanoma of back ([place]) 1976  . Myocardial infarction [facility]) 1996; 2011   "both silent" (12/15/2012)  . Nonrheumatic aortic valve stenosis   . Orthostatic hypotension   . Osteoporosis, senile   . Pacemaker   . RBBB   . Restless leg 02/02/2015  . Right leg weakness 12/06/2015  . [facility] spotted fever   . S/P CABG x 4   . Sick sinus syndrome ([place]) 01/31/2014  . Sustained ventricular tachycardia ([place]) 07/27/2014  . Urinary retention 12/30/2014    SOCIAL HX:    Social History   Tobacco Use  . Smoking status: Never Smoker  . Smokeless tobacco: Never Used  Substance Use Topics  . Alcohol use: No    Alcohol/week: 0.0 standard drinks    ALLERGIES:  Allergies  Allergen Reactions  . Altace [Ramipril] Cough  . Crestor [Rosuvastatin Calcium] Rash  . Penicillins Rash and Other (See Comments)    Has patient had a PCN reaction causing immediate rash, facial/tongue/throat swelling, SOB or lightheadedness with hypotension: Yes Has patient had a PCN reaction causing severe rash involving mucus membranes or skin necrosis: No Has patient had a PCN reaction that required hospitalization No Has patient had a PCN reaction occurring within the last 10 years: No If all of the above answers are "NO", then may proceed with Cephalosporin use.   . Sulfa Antibiotics Rash     PERTINENT MEDICATIONS:  Outpatient Encounter Medications as of 09/11/2017  Medication Sig  . acetaminophen (TYLENOL) 325 MG tablet Take 650 mg by mouth every 8 (eight) hours as needed for mild pain, moderate pain or headache.   Marland Kitchen amiodarone (PACERONE) 100 MG tablet Take 1 tablet (100 mg total) by mouth daily.  Marland Kitchen aspirin EC 81 MG tablet Take 1 tablet (81 mg total) by mouth daily.  . Cholecalciferol (VITAMIN D3) 5000 UNITS CAPS Take 5,000 Units by mouth every Monday.   . clopidogrel (PLAVIX) 75 MG tablet TAKE 1 TABLET ONCE DAILY.  Marland Kitchen Droxidopa 100 MG CAPS Take 100 mg by mouth 3 (three) times daily.  Marland Kitchen guaiFENesin (ROBITUSSIN) 100 MG/5ML SOLN Take 5 mLs (100 mg total) by mouth every 4 (four) hours as needed for cough or to loosen phlegm.  Marland Kitchen HYDROcodone-acetaminophen (NORCO/VICODIN) 5-325 MG tablet Take 1 tablet by mouth every 4 (four) hours as needed for severe pain.  Marland Kitchen levothyroxine (SYNTHROID, LEVOTHROID) 100 MCG tablet Take 100 mcg by mouth daily before breakfast.  . Lidocaine 4 % PTCH Apply 1 patch topically daily. Left shoulder  . meclizine (ANTIVERT) 25 MG tablet Take 1 tablet (25 mg  total) by mouth 2 (two) times daily as needed for dizziness.  . Melatonin 3 MG TABS Take 3 mg by mouth at bedtime.  . midodrine (PROAMATINE) 10 MG tablet TAKE 1 TABLET THREE TIMES DAILY. TAKE LAST DOSE AT LEAST 6 HOURS BEFORE BEDTIME.  . pantoprazole (PROTONIX) 40 MG tablet TAKE 1 TABLET BY MOUTH TWICE DAILY.  Marland Kitchen polyethylene glycol (MIRALAX / GLYCOLAX) packet Take 17 g by mouth daily. Hold for loose stool  . pravastatin (PRAVACHOL) 40 MG tablet Take 1 tablet (40 mg total) by mouth at bedtime.  . predniSONE (DELTASONE) 10 MG tablet Take 20mg  daily for 3days,Take 10mg  daily for 3days, then stop.  Marland Kitchen rOPINIRole (REQUIP) 0.5 MG tablet TAKE 1 TABLET 3 TIMES A DAY AS NEEDED.  Marland Kitchen sennosides-docusate sodium (SENOKOT-S) 8.6-50 MG tablet Take 2 tablets by mouth at bedtime.   . torsemide (DEMADEX) 10 MG tablet Take 10 mg by mouth daily.   Marland Kitchen  traZODone (DESYREL) 50 MG tablet Take 1 tablet (50 mg total) by mouth at bedtime.   No facility-administered encounter medications on file as of 09/11/2017.     PHYSICAL EXAM:   General: NAD,  Cardiovascular: regular rate and rhythm Pulmonary: clear ant fields Abdomen: soft, nontender, + bowel sounds GU: no suprapubic tenderness Extremities: no edema, neck pain with movement; lower ext weakness Skin: no rashes Neurological: Weakness but otherwise nonfocal  Stephanie G [place], NP

## 2017-09-12 ENCOUNTER — Non-Acute Institutional Stay (SKILLED_NURSING_FACILITY): Payer: Medicare Other | Admitting: Family

## 2017-09-12 ENCOUNTER — Encounter: Payer: Self-pay | Admitting: *Deleted

## 2017-09-12 ENCOUNTER — Telehealth: Payer: Self-pay | Admitting: *Deleted

## 2017-09-12 ENCOUNTER — Encounter: Payer: Self-pay | Admitting: Family

## 2017-09-12 DIAGNOSIS — M47812 Spondylosis without myelopathy or radiculopathy, cervical region: Secondary | ICD-10-CM | POA: Diagnosis not present

## 2017-09-12 LAB — HEPATIC FUNCTION PANEL
ALT: 25 (ref 10–40)
AST: 20 (ref 14–40)
Alkaline Phosphatase: 76 (ref 25–125)
Bilirubin, Total: 0.3

## 2017-09-12 LAB — CBC AND DIFFERENTIAL
HCT: 39 — AB (ref 41–53)
Hemoglobin: 12.8 — AB (ref 13.5–17.5)
Platelets: 300 (ref 150–399)
WBC: 7.6

## 2017-09-12 LAB — BASIC METABOLIC PANEL
Creatinine: 0.6 (ref 0.6–1.3)
Glucose: 88
Potassium: 4.2 (ref 3.4–5.3)
Sodium: 136 — AB (ref 137–147)

## 2017-09-12 NOTE — Telephone Encounter (Signed)
Will evaluate patient today then adjust medication if needed.

## 2017-09-12 NOTE — Progress Notes (Signed)
Location:  [place] Room Number: 20 Place of Service:  SNF (31) Provider: Dinah Ngetich FNP-C  Blanchie Serve, MD  Patient Care Team: Blanchie Serve, MD as PCP - General (Internal Medicine) Croitoru, Dani Gobble, MD as Attending Physician (Cardiology) Vevelyn Royals, MD as Consulting Physician (Ophthalmology) Franchot Gallo, MD as Consulting Physician (Urology) Tanda Rockers, MD as Consulting Physician (Pulmonary Disease) Allyn Kenner, MD (Dermatology) Deliah Goody, PA-C as Physician Assistant (Physician Assistant) Ngetich, Nelda Bucks, NP as Nurse Practitioner (Family Medicine)  Extended Emergency Contact Information Primary Emergency Contact: Maddix,[name] Address: [address], [place] Phone: [phone] Mobile Phone: [phone] Relation: Spouse Secondary Emergency Contact: [name],Dulworth  [place] Mobile Phone: [phone] Relation: Daughter  Code Status:  Full code   Goals of care: Advanced Directive information Advanced Directives 09/12/2017  Does Patient Have a Medical Advance Directive? Yes  Type of Paramedic of [place];Living will  Does patient want to make changes to medical advance directive? -  Copy of [place] in Chart? Yes  Would patient like information on creating a medical advance directive? -  Pre-existing out of facility DNR order (yellow form or pink MOST form) -     Chief Complaint  Patient presents with  . Acute Visit    pain management    HPI:  Pt is a 84 y.o. male seen today at [facility] for an acute visit for evaluation of neck pain per patient's daughter request.He is seen in his room today with wife and daughter present at bedside.Patient's daughter called provider's office wanting to know whether there was anything else that can be done to help control the pain.Patient's rates pain at level 8 on scale of  10.Patient's wife states he seems to be much better today.he was up prior to visit sitting on recliner for the first time since post hospital discharge 09/04/2017.of note CT scan of cervical spine showed grade 1 anterolisthesis at C3-4 and C4-5, fusion of left C3-4 facet joint and facet arthrosis at right C4-6, no spinal canal stenosis.He is currently on Norco 5-325 mg tablet every 4 hours as needed,Robaxin 250 mg tablet at bedtime and and Tylenol 1000 mg tablet three times daily.He recently completed prednisone and IV anabiotics for bacteriemia.He has an upcoming appointment with Neurosurgery 09/30/2017 per POA. He denies any fever,chills,numbness or tingling on fingers.    Past Medical History:  Diagnosis Date  . Arthritis    "minor, back and sometimes knees" (12/15/2012)  . Bradycardia    AFib/SSS s/p St Jude PPM 04/12/2008  . CAD (coronary artery disease) 12/30/2014   CABG (LIMA-LAD, SVG-RCA, SVG-OM in 1996).  07/2009 BMS to SVG-RCA. Cath in 04/2010 with patent stents   . Cardiomyopathy, ischemic 08/25/2012  . CHF (congestive heart failure) ([place])   . Chronic knee pain 12/03/2014  . Combined congestive systolic and diastolic heart failure ([place]) 02/02/2015   Hx EF 41%. BNP 96.8 02/21/15 Torsemide 04/06/15 Na 142, K 4.6, Bun 16, creat 0.89 04/20/14 BNP 111.7, Na 142, K 4.6, Bun 16, creat 0.9   . Depression with anxiety 02/02/2015   02/21/15 Hgb A1c 5.8 03/10/15 MMSE 30/30   . Dizziness, after diuretic asscoiated with hypotension and responded to fluid bolus 06/05/2011   04/28/15 US carotid R+L normal bilateral arterial velocities.    Marland Kitchen Dyspnea 08/11/2014   Followed in Pulmonary clinic/ [facility]/ [facility]  -  08/11/2014  Walked RA x 1 laps @ 185 ft each stopped due to fatigue/off balance/ slow pace/  no sob or desat  - PFT's  09/26/2014  FEV1 2.26 (85 % ) ratio 76  p no % improvement from saba with DLCO  67 % corrects to 93 % for alv volume      Since prev study 08/04/13 minimal change lung vol or dlco    .  Embolic cerebral infarction ([place]) 12/06/2015  . Exertional shortness of breath    "sometimes walking" (12/15/2012)  . GERD (gastroesophageal reflux disease)   . Gout 02/09/2015  . Heart murmur    "just told I had one today" (12/15/2012)  . Hiatal hernia   . Hyperlipidemia   . Hypertension   . Hypothyroid   . Influenza A 03/10/2016  . Insomnia   . Melanoma of back ([place]) 1976  . Myocardial infarction [facility]) 1996; 2011   "both silent" (12/15/2012)  . Nonrheumatic aortic valve stenosis   . Orthostatic hypotension   . Osteoporosis, senile   . Pacemaker   . RBBB   . Restless leg 02/02/2015  . Right leg weakness 12/06/2015  . [facility] spotted fever   . S/P CABG x 4   . Sick sinus syndrome ([place]) 01/31/2014  . Sustained ventricular tachycardia ([place]) 07/27/2014  . Urinary retention 12/30/2014   Past Surgical History:  Procedure Laterality Date  . CARDIAC CATHETERIZATION  04/2010   LIMA to LAD patent,SVG to OM patent,no in-stnet restenosis RCA  . CATARACT EXTRACTION W/ INTRAOCULAR LENS  IMPLANT, BILATERAL Bilateral 2012  . CORONARY ANGIOPLASTY WITH STENT PLACEMENT  07/2009   bare metal stent to SVG to the RCA  . CORONARY ARTERY BYPASS GRAFT  1996   LIMA to LAD,SVG to RCA & SVG to OM  . INSERT / REPLACE / REMOVE PACEMAKER  2010  . MELANOMA EXCISION  05/1974 X2   "taken off my back" (12/15/2012)  . NM MYOVIEW LTD  06/2011   low risk  . PPM GENERATOR CHANGEOUT N/A 01/22/2017   Procedure: PPM GENERATOR CHANGEOUT;  Surgeon: [name], MD;  Location: [place] CV LAB;  Service: Cardiovascular;  Laterality: N/A;  . TONSILLECTOMY  1938  . TRANSURETHRAL RESECTION OF PROSTATE  1986  . US ECHOCARDIOGRAPHY  07/11/2009   EF 45-50%    Allergies  Allergen Reactions  . Altace [Ramipril] Cough  . Crestor [Rosuvastatin Calcium] Rash  . Penicillins Rash and Other (See Comments)    Has patient had a PCN reaction causing immediate rash, facial/tongue/throat swelling, SOB or lightheadedness  with hypotension: Yes Has patient had a PCN reaction causing severe rash involving mucus membranes or skin necrosis: No Has patient had a PCN reaction that required hospitalization No Has patient had a PCN reaction occurring within the last 10 years: No If all of the above answers are "NO", then may proceed with Cephalosporin use.   . Sulfa Antibiotics Rash    Outpatient Encounter Medications as of 09/12/2017  Medication Sig  . acetaminophen (TYLENOL) 325 MG tablet Take 650 mg by mouth every 8 (eight) hours as needed for mild pain, moderate pain or headache.   . Amino Acids-Protein Hydrolys (FEEDING SUPPLEMENT, PRO-STAT SUGAR FREE 64,) LIQD Take 30 mLs by mouth daily.  Marland Kitchen amiodarone (PACERONE) 100 MG tablet Take 1 tablet (100 mg total) by mouth daily.  Marland Kitchen aspirin EC 81 MG tablet Take 1 tablet (81 mg total) by mouth daily.  . Cholecalciferol (VITAMIN D3) 5000 UNITS CAPS Take 5,000  Units by mouth every Monday.   . clopidogrel (PLAVIX) 75 MG tablet TAKE 1 TABLET ONCE DAILY.  Marland Kitchen Droxidopa 100 MG CAPS Take 100 mg by mouth 3 (three) times daily.  Marland Kitchen guaiFENesin (ROBITUSSIN) 100 MG/5ML SOLN Take 5 mLs (100 mg total) by mouth every 4 (four) hours as needed for cough or to loosen phlegm.  Marland Kitchen HYDROcodone-acetaminophen (NORCO/VICODIN) 5-325 MG tablet Take 1 tablet by mouth every 4 (four) hours as needed for severe pain.  Marland Kitchen levothyroxine (SYNTHROID, LEVOTHROID) 100 MCG tablet Take 100 mcg by mouth daily before breakfast.  . Lidocaine 4 % PTCH Apply 1 patch topically daily. Left shoulder  . meclizine (ANTIVERT) 25 MG tablet Take 1 tablet (25 mg total) by mouth 2 (two) times daily as needed for dizziness.  . Melatonin 3 MG TABS Take 3 mg by mouth at bedtime.  . methocarbamol (ROBAXIN) 500 MG tablet Take 250 mg by mouth at bedtime.  . midodrine (PROAMATINE) 10 MG tablet TAKE 1 TABLET THREE TIMES DAILY. TAKE LAST DOSE AT LEAST 6 HOURS BEFORE BEDTIME.  . pantoprazole (PROTONIX) 40 MG tablet TAKE 1 TABLET BY MOUTH  TWICE DAILY.  Marland Kitchen polyethylene glycol (MIRALAX / GLYCOLAX) packet Take 17 g by mouth daily. Hold for loose stool  . pravastatin (PRAVACHOL) 40 MG tablet Take 1 tablet (40 mg total) by mouth at bedtime.  Marland Kitchen rOPINIRole (REQUIP) 0.5 MG tablet TAKE 1 TABLET 3 TIMES A DAY AS NEEDED.  Marland Kitchen sennosides-docusate sodium (SENOKOT-S) 8.6-50 MG tablet Take 2 tablets by mouth at bedtime.   . torsemide (DEMADEX) 10 MG tablet Take 5 mg by mouth daily.   . traZODone (DESYREL) 50 MG tablet Take 1 tablet (50 mg total) by mouth at bedtime.  . [DISCONTINUED] predniSONE (DELTASONE) 10 MG tablet Take 20mg  daily for 3days,Take 10mg  daily for 3days, then stop.   No facility-administered encounter medications on file as of 09/12/2017.     Review of Systems  Constitutional: Negative for chills, fatigue and fever.  Respiratory: Negative for cough, chest tightness, shortness of breath and wheezing.   Cardiovascular: Positive for leg swelling. Negative for chest pain and palpitations.  Gastrointestinal: Negative for abdominal distention, abdominal pain, constipation, diarrhea, nausea and vomiting.  Musculoskeletal: Positive for gait problem and neck pain.  Skin: Negative for color change, pallor and rash.  Neurological: Negative for dizziness, light-headedness, numbness and headaches.  Psychiatric/Behavioral: Negative for agitation, confusion and sleep disturbance. The patient is not nervous/anxious.     Immunization History  Administered Date(s) Administered  . Influenza, High Dose Seasonal PF 11/01/2014, 11/13/2016  . Influenza-Unspecified 12/05/2013, 11/05/2014, 11/16/2015  . PPD Test 03/07/2014  . Pneumococcal Conjugate-13 10/05/2009  . Pneumococcal Polysaccharide-23 11/05/2005  . Pneumococcal-Unspecified 10/05/2009  . Tdap 08/02/2014  . Zoster 11/05/2005   Pertinent  Health Maintenance Due  Topic Date Due  . PNA vac Low Risk Adult (2 of 2 - PCV13) 10/06/2010  . INFLUENZA VACCINE  09/04/2017  . DEXA SCAN   12/14/2024   Fall Risk  10/02/2016 10/30/2015 09/05/2015 02/02/2015  Falls in the past year? No No No No  Risk for fall due to : - Impaired balance/gait - -   Functional Status Survey:    Vitals:   09/12/17 1438  BP: 123/77  Pulse: 73  Resp: 20  Temp: 98.9 F (37.2 C)  SpO2: 95%  Weight: 171 lb (77.6 kg)  Height: 5\' 10"  (1.778 m)   Body mass index is 24.54 kg/m. Physical Exam  Constitutional: He is oriented to person, place,  and time. He appears well-developed and well-nourished.  Elderly in no acute distress   HENT:  Head: Normocephalic.  Mouth/Throat: Oropharynx is clear and moist. No oropharyngeal exudate.  Eyes: Pupils are equal, round, and reactive to light. Conjunctivae are normal. Right eye exhibits no discharge. Left eye exhibits no discharge. No scleral icterus.  Neck:  Lying in bed on left side  Cardiovascular: Normal rate, regular rhythm, normal heart sounds and intact distal pulses. Exam reveals no gallop and no friction rub.  No murmur heard. Pulmonary/Chest: Effort normal and breath sounds normal. No respiratory distress. He has no wheezes. He has no rales.  Abdominal: Soft. Bowel sounds are normal. He exhibits no distension and no mass. There is no tenderness. There is no rebound and no guarding.  Musculoskeletal: He exhibits no tenderness.  Moves x 4 extremities except limited ROM to left shoulder due to pain.trace edema to both lower extremities.Knee high ted hose in place.    Lymphadenopathy:    He has no cervical adenopathy.  Neurological: He is oriented to person, place, and time. Gait abnormal.  Skin: Skin is warm and dry. No rash noted. No erythema. No pallor.  Psychiatric: He has a normal mood and affect. His speech is normal and behavior is normal. Judgment and thought content normal.    Labs reviewed: Recent Labs    08/26/17 0426 08/27/17 0423 08/29/17 1022 08/31/17 0342 09/04/17 1054 09/08/17 09/11/17  NA 139 142 140 139 140 137 136  K 4.2  3.8 3.8 3.7 3.4* 4.4 4.2  CL 107 108 106 106 105  --   --   CO2 25 26 25 25 26   --   --   GLUCOSE 104* 109* 113* 120* 94  --   --   BUN 13 18 15 17 15 14 17   CREATININE 0.49* 0.57* 0.55* 0.51* 0.51* 0.61 0.59  CALCIUM 8.6* 8.5* 8.8* 8.3* 8.0* 8.6 8.2  MG 2.2 2.1  --   --   --   --   --   PHOS 3.4  --   --   --   --   --   --    Recent Labs    08/27/17 0423 09/04/17 1054 09/11/17  AST 22 33 20  ALT 17 45* 25  ALKPHOS 59 61 76  BILITOT 0.6 0.3 0.3  PROT 6.2* 5.8* 5.8  ALBUMIN 2.5* 2.4* 3.0   Recent Labs    04/07/17 2358  08/27/17 0423 08/29/17 1022 08/31/17 0342 09/04/17 1054 09/08/17 09/11/17  WBC 8.6   < > 8.9 11.5* 8.1 12.2* 9.5 7.6  NEUTROABS 6.2  --  6.5  --   --  8.9*  --   --   HGB 13.7   < > 12.1* 13.7 12.4* 12.8* 12.9 12.8  HCT 41.1   < > 36.9* 40.5 38.4* 39.2 38.9 38.8  MCV 97.4   < > 97.1 95.3 97.0 97.3 93.5 94.6  PLT 230   < > 243 322 339 354  --   --    < > = values in this interval not displayed.   Lab Results  Component Value Date   TSH 1.125 08/26/2017   Lab Results  Component Value Date   HGBA1C 5.6 12/07/2015   Lab Results  Component Value Date   CHOL 103 05/05/2017   HDL 39 (L) 05/05/2017   LDLCALC 43 05/05/2017   TRIG 129 05/05/2017   CHOLHDL 2.6 05/05/2017    Significant Diagnostic Results in last 30  days:  Dg Chest 2 View  Result Date: 08/25/2017 CLINICAL DATA:  Weakness. EXAM: CHEST - 2 VIEW COMPARISON:  11/27/2016 FINDINGS: There is a large chronic hiatal hernia. There is slight atelectasis at the lung bases but the patient has taken a very shallow inspiration. No discrete infiltrates or effusions. Heart size is normal. CABG. Pacemaker in place. No acute bone abnormality. IMPRESSION: Mild bibasilar atelectasis, at least in part due to a shallow inspiration. Large chronic hiatal hernia. Electronically Signed   By: [name] M.D.   On: 08/25/2017 13:13   Dg Shoulder Right  Result Date: 08/25/2017 CLINICAL DATA:  84 year old male  with bilateral shoulder pain. No injury. Initial encounter. EXAM: RIGHT SHOULDER - 2+ VIEW COMPARISON:  None. FINDINGS: Mild to moderate acromioclavicular joint and acromial humeral joint degenerative changes. No fracture or dislocation. Visualized lungs clear. IMPRESSION: Mild to moderate acromioclavicular joint and acromial humeral joint degenerative changes. Electronically Signed   By: Genia Del M.D.   On: 08/25/2017 18:58   Ct Head Wo Contrast  Result Date: 08/25/2017 CLINICAL DATA:  Altered mental status and possible fall. EXAM: CT HEAD WITHOUT CONTRAST CT CERVICAL SPINE WITHOUT CONTRAST TECHNIQUE: Multidetector CT imaging of the head and cervical spine was performed following the standard protocol without intravenous contrast. Multiplanar CT image reconstructions of the cervical spine were also generated. COMPARISON:  Head CT 01/11/2016 FINDINGS: CT HEAD FINDINGS Brain: There is no mass, hemorrhage or extra-axial collection. There is generalized atrophy without lobar predilection. There is no acute or chronic infarction. There is hypoattenuation of the periventricular white matter, most commonly indicating chronic ischemic microangiopathy. Vascular: Atherosclerotic calcification of the internal carotid arteries at the skull base. No abnormal hyperdensity of the major intracranial arteries or dural venous sinuses. Skull: The visualized skull base, calvarium and extracranial soft tissues are normal. Sinuses/Orbits: No fluid levels or advanced mucosal thickening of the visualized paranasal sinuses. No mastoid or middle ear effusion. The orbits are normal. CT CERVICAL SPINE FINDINGS Alignment: Grade 1 anterolisthesis at C3-C4 and C4-C5. This is secondary to facet hypertrophy. Alignment is otherwise normal. Skull base and vertebrae: No acute fracture. Soft tissues and spinal canal: No prevertebral fluid or swelling. No visible canal hematoma. Disc levels: Fusion of the left C3-4 facet joint. Facet arthrosis  is most severe at right C4-C6. No spinal canal stenosis. Upper chest: No pneumothorax, pulmonary nodule or pleural effusion. Other: Normal visualized paraspinal cervical soft tissues. IMPRESSION: 1. Chronic ischemic microangiopathy and generalized volume loss without acute intracranial abnormality. 2. No acute fracture of the cervical spine. 3. Multilevel moderate-to-severe facet arthrosis with resultant spondylolisthesis. Electronically Signed   By: Ulyses Jarred M.D.   On: 08/25/2017 13:53   Ct Soft Tissue Neck W Contrast  Result Date: 08/29/2017 CLINICAL DATA:  Neck pain and BILATERAL shoulder pain. Difficulty swallowing. EXAM: CT NECK WITH CONTRAST TECHNIQUE: Multidetector CT imaging of the neck was performed using the standard protocol following the bolus administration of intravenous contrast. CONTRAST:  93mL OMNIPAQUE IOHEXOL 300 MG/ML  SOLN COMPARISON:  None. FINDINGS: Pharynx and larynx: Normal. No mass or swelling. Salivary glands: No inflammation, mass, or stone. Thyroid: Normal. Lymph nodes: None enlarged or abnormal density. Vascular: Atherosclerosis.  Vessel patency is established. Limited intracranial: Generalized atrophy. Visualized orbits: Barely visualized, no gross abnormality. Mastoids and visualized paranasal sinuses: No layering sinus or mastoid fluid. Skeleton: Spondylosis.  No worrisome osseous lesion. Upper chest: No pneumothorax. Mild vascular congestion. No visible lung mass. Pacemaker, LEFT subclavian approach. Prior median sternotomy for  CABG. Other: Healed IMPRESSION: No pharyngeal or neck inflammatory process is evident. Airway appears patent. No esophageal air-fluid level or significant neck mass or adenopathy is evident. Electronically Signed   By: Staci Righter M.D.   On: 08/29/2017 13:07   Ct Cervical Spine Wo Contrast  Result Date: 08/25/2017 CLINICAL DATA:  Altered mental status and possible fall. EXAM: CT HEAD WITHOUT CONTRAST CT CERVICAL SPINE WITHOUT CONTRAST  TECHNIQUE: Multidetector CT imaging of the head and cervical spine was performed following the standard protocol without intravenous contrast. Multiplanar CT image reconstructions of the cervical spine were also generated. COMPARISON:  Head CT 01/11/2016 FINDINGS: CT HEAD FINDINGS Brain: There is no mass, hemorrhage or extra-axial collection. There is generalized atrophy without lobar predilection. There is no acute or chronic infarction. There is hypoattenuation of the periventricular white matter, most commonly indicating chronic ischemic microangiopathy. Vascular: Atherosclerotic calcification of the internal carotid arteries at the skull base. No abnormal hyperdensity of the major intracranial arteries or dural venous sinuses. Skull: The visualized skull base, calvarium and extracranial soft tissues are normal. Sinuses/Orbits: No fluid levels or advanced mucosal thickening of the visualized paranasal sinuses. No mastoid or middle ear effusion. The orbits are normal. CT CERVICAL SPINE FINDINGS Alignment: Grade 1 anterolisthesis at C3-C4 and C4-C5. This is secondary to facet hypertrophy. Alignment is otherwise normal. Skull base and vertebrae: No acute fracture. Soft tissues and spinal canal: No prevertebral fluid or swelling. No visible canal hematoma. Disc levels: Fusion of the left C3-4 facet joint. Facet arthrosis is most severe at right C4-C6. No spinal canal stenosis. Upper chest: No pneumothorax, pulmonary nodule or pleural effusion. Other: Normal visualized paraspinal cervical soft tissues. IMPRESSION: 1. Chronic ischemic microangiopathy and generalized volume loss without acute intracranial abnormality. 2. No acute fracture of the cervical spine. 3. Multilevel moderate-to-severe facet arthrosis with resultant spondylolisthesis. Electronically Signed   By: Ulyses Jarred M.D.   On: 08/25/2017 13:53   Dg Shoulder Left  Result Date: 08/25/2017 CLINICAL DATA:  84 year old male with bilateral shoulder pain.  No injury. Initial encounter. EXAM: LEFT SHOULDER - 2+ VIEW COMPARISON:  09/25/2006 MR. FINDINGS: Moderate acromioclavicular joint degenerative changes. Superiorly located humeral head consistent with rotator cuff tear with acromial humeral joint degenerative changes. No fracture or dislocation. Pacemaker in place. Visualized lungs clear. IMPRESSION: Moderate acromioclavicular joint degenerative changes. Superiorly located humeral head consistent with rotator cuff tear with acromial humeral joint degenerative changes. Electronically Signed   By: Genia Del M.D.   On: 08/25/2017 19:00   Dg Swallowing Func-speech Pathology  Result Date: 09/01/2017 Objective Swallowing Evaluation: Type of Study: MBS-Modified Barium Swallow Study  Patient Details Name: BRANNAN CASSEDY MRN: [id number] Date of Birth: March 24, 1926 Today's Date: 09/01/2017 Time: SLP Start Time (ACUTE ONLY): 1535 -SLP Stop Time (ACUTE ONLY): 1615 SLP Time Calculation (min) (ACUTE ONLY): 40 min Past Medical History: Past Medical History: Diagnosis Date . Arthritis   "minor, back and sometimes knees" (12/15/2012) . Bradycardia   AFib/SSS s/p St Jude PPM 04/12/2008 . CAD (coronary artery disease) 12/30/2014  CABG (LIMA-LAD, SVG-RCA, SVG-OM in 1996).  07/2009 BMS to SVG-RCA. Cath in 04/2010 with patent stents  . Cardiomyopathy, ischemic 08/25/2012 . CHF (congestive heart failure) ([place])  . Chronic knee pain 12/03/2014 . Combined congestive systolic and diastolic heart failure ([place]) 02/02/2015  Hx EF 41%. BNP 96.8 02/21/15 Torsemide 04/06/15 Na 142, K 4.6, Bun 16, creat 0.89 04/20/14 BNP 111.7, Na 142, K 4.6, Bun 16, creat 0.9  . Depression with anxiety 02/02/2015  02/21/15 Hgb A1c 5.8 03/10/15 MMSE 30/30  . Dizziness, after diuretic asscoiated with hypotension and responded to fluid bolus 06/05/2011  04/28/15 US carotid R+L normal bilateral arterial velocities.   Marland Kitchen Dyspnea 08/11/2014  Followed in Pulmonary clinic/ [facility]/ [facility]  - 08/11/2014  Walked RA x 1 laps @ 185 ft  each stopped due to fatigue/off balance/ slow pace/  no sob or desat  - PFT's  09/26/2014  FEV1 2.26 (85 % ) ratio 76  p no % improvement from saba with DLCO  67 % corrects to 93 % for alv volume      Since prev study 08/04/13 minimal change lung vol or dlco   . Embolic cerebral infarction ([place]) 12/06/2015 . Exertional shortness of breath   "sometimes walking" (12/15/2012) . GERD (gastroesophageal reflux disease)  . Gout 02/09/2015 . Heart murmur   "just told I had one today" (12/15/2012) . Hiatal hernia  . Hyperlipidemia  . Hypertension  . Hypothyroid  . Influenza A 03/10/2016 . Insomnia  . Melanoma of back ([place]) 1976 . Myocardial infarction [facility]) 1996; 2011  "both silent" (12/15/2012) . Nonrheumatic aortic valve stenosis  . Orthostatic hypotension  . Osteoporosis, senile  . Pacemaker  . RBBB  . Restless leg 02/02/2015 . Right leg weakness 12/06/2015 . [facility] spotted fever  . S/P CABG x 4  . Sick sinus syndrome ([place]) 01/31/2014 . Sustained ventricular tachycardia ([place]) 07/27/2014 . Urinary retention 12/30/2014 Past Surgical History: Past Surgical History: Procedure Laterality Date . CARDIAC CATHETERIZATION  04/2010  LIMA to LAD patent,SVG to OM patent,no in-stnet restenosis RCA . CATARACT EXTRACTION W/ INTRAOCULAR LENS  IMPLANT, BILATERAL Bilateral 2012 . CORONARY ANGIOPLASTY WITH STENT PLACEMENT  07/2009  bare metal stent to SVG to the RCA . CORONARY ARTERY BYPASS GRAFT  1996  LIMA to LAD,SVG to RCA & SVG to OM . INSERT / REPLACE / REMOVE PACEMAKER  2010 . MELANOMA EXCISION  05/1974 X2  "taken off my back" (12/15/2012) . NM MYOVIEW LTD  06/2011  low risk . PPM GENERATOR CHANGEOUT N/A 01/22/2017  Procedure: PPM GENERATOR CHANGEOUT;  Surgeon: [name], MD;  Location: [place] [facility] LAB;  Service: Cardiovascular;  Laterality: N/A; . TONSILLECTOMY  1938 . TRANSURETHRAL RESECTION OF PROSTATE  1986 . US ECHOCARDIOGRAPHY  07/11/2009  EF 45-67% HPI: 84 year old male admitted 08/25/17 with pain and weakness, UTI. PMH:  CAD/CABG, CHF, depression/anxiety, bradycardia, sick sinus syndrome, pacer, CVA, GERD, gout, heart [place], [place] Spotted Fever, Melanoma, HTN, RLS, urinary retention, MI x2  Subjective: pt awake in bed, son in law and daughter Caryl Asp present Assessment / Plan / Recommendation CHL IP CLINICAL IMPRESSIONS 09/01/2017 Clinical Impression Pt with significantly improved swallow function compared to [facility] on Friday June 26th.  Mild dysphagia present without aspiration/penetration of any consistency tested (thin, nectar, pudding, cracker, barium tablet).  Mild weakness continues with mild residuals with intermittent sensation.  Pt able to clear residuals with cued dry swallow.  In addition, pharyngeal swallow triggered at pyriform sinus with thin = therefore rec pt sit fully upright for all po~  Of note, pt throat clearing during MBS without barium in larynx, he does appear with secretions retained in pharynx.  Will follow up for dysphagia management and RMST to address dysphagia/cough strength.  Family present and pt, and family educated to findings/recommendations during [place]. SLP Visit Diagnosis Dysphagia, oropharyngeal phase (R13.12) Attention and concentration deficit following -- Frontal lobe and executive function deficit following -- Impact on safety and function Mild  aspiration risk   CHL IP TREATMENT RECOMMENDATION 09/01/2017 Treatment Recommendations Therapy as outlined in treatment plan below   Prognosis 09/01/2017 Prognosis for Safe Diet Advancement Good Barriers to Reach Goals -- Barriers/Prognosis Comment -- [facility] IP DIET RECOMMENDATION 09/01/2017 SLP Diet Recommendations Dysphagia 3 (Mech soft) solids;Thin liquid Liquid Administration via -- Medication Administration Whole meds with puree Compensations Slow rate;Small sips/bites;Follow solids with liquid Postural Changes Seated upright at 90 degrees;Remain semi-upright after after feeds/meals (Comment)   CHL IP OTHER RECOMMENDATIONS 09/01/2017 Recommended Consults --  Oral Care Recommendations Oral care BID Other Recommendations --   CHL IP FOLLOW UP RECOMMENDATIONS 09/01/2017 Follow up Recommendations Skilled Nursing facility   [facility] IP FREQUENCY AND DURATION 09/01/2017 Speech Therapy Frequency (ACUTE ONLY) min 1 x/week Treatment Duration 1 week      CHL IP ORAL PHASE 09/01/2017 Oral Phase WFL Oral - Pudding Teaspoon -- Oral - Pudding Cup -- Oral - Honey Teaspoon -- Oral - Honey Cup -- Oral - Nectar Teaspoon -- Oral - Nectar Cup WFL Oral - Nectar Straw -- Oral - Thin Teaspoon WFL Oral - Thin Cup WFL Oral - Thin Straw WFL Oral - Puree WFL Oral - Mech Soft WFL Oral - Regular -- Oral - Multi-Consistency -- Oral - Pill Delayed oral transit Oral Phase - Comment --  CHL IP PHARYNGEAL PHASE 09/01/2017 Pharyngeal Phase -- Pharyngeal- Pudding Teaspoon -- Pharyngeal -- Pharyngeal- Pudding Cup -- Pharyngeal -- Pharyngeal- Honey Teaspoon -- Pharyngeal -- Pharyngeal- Honey Cup -- Pharyngeal -- Pharyngeal- Nectar Teaspoon WFL Pharyngeal -- Pharyngeal- Nectar Cup Reduced tongue base retraction Pharyngeal -- Pharyngeal- Nectar Straw -- Pharyngeal -- Pharyngeal- Thin Teaspoon Delayed swallow initiation-pyriform sinuses;Reduced tongue base retraction Pharyngeal -- Pharyngeal- Thin Cup Delayed swallow initiation-pyriform sinuses;Reduced tongue base retraction Pharyngeal -- Pharyngeal- Thin Straw Delayed swallow initiation-pyriform sinuses;Reduced tongue base retraction Pharyngeal -- Pharyngeal- Puree Reduced tongue base retraction Pharyngeal -- Pharyngeal- Mechanical Soft WFL Pharyngeal -- Pharyngeal- Regular -- Pharyngeal -- Pharyngeal- Multi-consistency -- Pharyngeal -- Pharyngeal- Pill WFL Pharyngeal -- Pharyngeal Comment --  CHL IP CERVICAL ESOPHAGEAL PHASE 09/01/2017 Cervical Esophageal Phase WFL Pudding Teaspoon -- Pudding Cup -- Honey Teaspoon -- Honey Cup -- Nectar Teaspoon -- Nectar Cup -- Nectar Straw -- Thin Teaspoon -- Thin Cup -- Thin Straw -- Puree -- Mechanical Soft -- Regular --  Multi-consistency -- Pill -- Cervical Esophageal Comment -- Macario Golds 09/01/2017, 7:45 PM  Luanna Salk, MS [facility] SLP [phone]             Dg Swallowing Func-speech Pathology  Result Date: 08/29/2017 Objective Swallowing Evaluation: Type of Study: MBS-Modified Barium Swallow Study  Patient Details Name: HILMER ALIBERTI MRN: [id number] Date of Birth: 12-24-1926 Today's Date: 08/29/2017 Time: SLP Start Time (ACUTE ONLY): 0850 -SLP Stop Time (ACUTE ONLY): 1015 SLP Time Calculation (min) (ACUTE ONLY): 85 min Past Medical History: Past Medical History: Diagnosis Date . Arthritis   "minor, back and sometimes knees" (12/15/2012) . Bradycardia   AFib/SSS s/p St Jude PPM 04/12/2008 . CAD (coronary artery disease) 12/30/2014  CABG (LIMA-LAD, SVG-RCA, SVG-OM in 1996).  07/2009 BMS to SVG-RCA. Cath in 04/2010 with patent stents  . Cardiomyopathy, ischemic 08/25/2012 . CHF (congestive heart failure) ([place])  . Chronic knee pain 12/03/2014 . Combined congestive systolic and diastolic heart failure ([place]) 02/02/2015  Hx EF 41%. BNP 96.8 02/21/15 Torsemide 04/06/15 Na 142, K 4.6, Bun 16, creat 0.89 04/20/14 BNP 111.7, Na 142, K 4.6, Bun 16, creat 0.9  . Depression with anxiety 02/02/2015  02/21/15 Hgb A1c  5.8 03/10/15 MMSE 30/30  . Dizziness, after diuretic asscoiated with hypotension and responded to fluid bolus 06/05/2011  04/28/15 US carotid R+L normal bilateral arterial velocities.   Marland Kitchen Dyspnea 08/11/2014  Followed in Pulmonary clinic/  Healthcare/ [place]  - 08/11/2014  Walked RA x 1 laps @ 185 ft each stopped due to fatigue/off balance/ slow pace/  no sob or desat  - PFT's  09/26/2014  FEV1 2.26 (85 % ) ratio 76  p no % improvement from saba with DLCO  67 % corrects to 93 % for alv volume      Since prev study 08/04/13 minimal change lung vol or dlco   . Embolic cerebral infarction ([place]) 12/06/2015 . Exertional shortness of breath   "sometimes walking" (12/15/2012) . GERD (gastroesophageal reflux disease)  . Gout 02/09/2015 . Heart murmur    "just told I had one today" (12/15/2012) . Hiatal hernia  . Hyperlipidemia  . Hypertension  . Hypothyroid  . Influenza A 03/10/2016 . Insomnia  . Melanoma of back ([place]) 1976 . Myocardial infarction [facility]) 1996; 2011  "both silent" (12/15/2012) . Nonrheumatic aortic valve stenosis  . Orthostatic hypotension  . Osteoporosis, senile  . Pacemaker  . RBBB  . Restless leg 02/02/2015 . Right leg weakness 12/06/2015 . [facility] spotted fever  . S/P CABG x 4  . Sick sinus syndrome ([place]) 01/31/2014 . Sustained ventricular tachycardia ([place]) 07/27/2014 . Urinary retention 12/30/2014 Past Surgical History: Past Surgical History: Procedure Laterality Date . CARDIAC CATHETERIZATION  04/2010  LIMA to LAD patent,SVG to OM patent,no in-stnet restenosis RCA . CATARACT EXTRACTION W/ INTRAOCULAR LENS  IMPLANT, BILATERAL Bilateral 2012 . CORONARY ANGIOPLASTY WITH STENT PLACEMENT  07/2009  bare metal stent to SVG to the RCA . CORONARY ARTERY BYPASS GRAFT  1996  LIMA to LAD,SVG to RCA & SVG to OM . INSERT / REPLACE / REMOVE PACEMAKER  2010 . MELANOMA EXCISION  05/1974 X2  "taken off my back" (12/15/2012) . NM MYOVIEW LTD  06/2011  low risk . PPM GENERATOR CHANGEOUT N/A 01/22/2017  Procedure: PPM GENERATOR CHANGEOUT;  Surgeon: [name], MD;  Location: [place] CV LAB;  Service: Cardiovascular;  Laterality: N/A; . TONSILLECTOMY  1938 . TRANSURETHRAL RESECTION OF PROSTATE  1986 . US ECHOCARDIOGRAPHY  07/11/2009  EF 45-51% HPI: 84 year old male admitted 08/25/17 with pain and weakness, UTI. PMH: CAD/CABG, CHF, depression/anxiety, bradycardia, sick sinus syndrome, pacer, CVA, GERD, gout, heart [place], [place] Spotted Fever, Melanoma, HTN, RLS, urinary retention, MI x2  Subjective: Pt and wife were seen in radiology for completion of MBS to objectively assess swallow function and safety. Pt was noted to clear his throat on a continuous basis prior to po trials. Significant pain reported - 8/10 at rest, 14/10 with swallow Assessment / Plan  / Recommendation CHL IP CLINICAL IMPRESSIONS 08/29/2017 Clinical Impression Limited MBS completed, due to severity of pharyngeal dysphagia. Extensive education was provided before and after this study. Pt reports tolerance of regular diet and thin liquids prior to admit, without difficulty or odynophagia. At this time, pt reports generalized pain 8/10, with significant odynophagia (14/10). Prior to start of po presentations, pt was noted to exhibit a wet, thick voice quality and frequent throat clearing, raising concern for poor management of secretions. Suction was set up; oral care was completed to minimize bacterial load given high risk of aspiration. Oral care effectively removed thick secretions from soft palate and dried secretions from the oral cavity. Small (1/2  to 1 tsp) boluses  of nectar thick liquid were provided individually via spoon, with discoordinated oral control and manipulation noted, as well as premature spillage over the tongue base, delayed swallow reflex, penetration and aspiration after the swallow, and post-swallow residue. Reflexive cough was elicited in response to aspiration. A small (1/8 tsp) bolus of puree was then given. Following discoordinated oral prep and propulsion, the bolus was noted to spill to the pyriform sinus prior to triggering the swallow reflex. Aspiration of pyriform stasis was noted, with cough demonstrated. Study was terminated at this point given exceptionally high risk of aspiration. SLP reviewed MBS results and provided extensive education on the significance of pt's swallowing deficit, need for NPO status at this time, and possible need to consider nonoral feeding methods.  Recommend consideration of Palliative Care consult to facilitate establishment of appropriate goals of care. ST will follow acutely for education, trial of dysphagia therapy (which may be a challenge given severity of odynophagia).  SLP Visit Diagnosis Dysphagia, pharyngeal phase (R13.13)  Impact on safety and function Risk for inadequate nutrition/hydration;Severe aspiration risk   CHL IP TREATMENT RECOMMENDATION 08/29/2017 Treatment Recommendations Therapy as outlined in treatment plan below   Prognosis 08/29/2017 Prognosis for Safe Diet Advancement Fair Barriers to Reach Goals Advanced age, multiple comorbidities CHL IP DIET RECOMMENDATION 08/29/2017 SLP Diet Recommendations NPO; Alternative means - temporary   Medication Administration Via alternative means   Postural Changes Recommend HOB remain at 45 degrees at all times, given poor secretion management   CHL IP OTHER RECOMMENDATIONS 08/29/2017 Recommended Consults Palliative Care consult for [place] Oral Care Recommendations Oral care QID;Staff/trained caregiver to provide oral care Other Recommendations Have oral suction available     CHL IP FREQUENCY AND DURATION 08/29/2017 Speech Therapy Frequency (ACUTE ONLY) min 2x/week Treatment Duration 2 weeks;1 week      CHL IP ORAL PHASE 08/29/2017 Oral Phase Slow bolus formation, discoordinated posterior propulsion  CHL IP PHARYNGEAL PHASE 08/29/2017 Pharyngeal Phase Impaired Pharyngeal- Nectar Teaspoon Delayed swallow initiation-pyriform sinuses;Delayed swallow initiation-vallecula;Reduced pharyngeal peristalsis;Reduced epiglottic inversion;Reduced anterior laryngeal mobility;Reduced laryngeal elevation;Reduced airway/laryngeal closure;Reduced tongue base retraction;Penetration/Apiration after swallow;Trace aspiration;Pharyngeal residue - valleculae;Pharyngeal residue - posterior pharnyx;Pharyngeal residue - pyriform Pharyngeal Material enters airway, passes BELOW cords and not ejected out despite cough attempt by patient Pharyngeal- Puree Delayed swallow initiation-vallecula;Delayed swallow initiation-pyriform sinuses;Reduced pharyngeal peristalsis;Reduced epiglottic inversion;Reduced airway/laryngeal closure;Reduced tongue base retraction;Reduced laryngeal elevation;Reduced anterior laryngeal  mobility;Penetration/Apiration after swallow;Trace aspiration;Pharyngeal residue - pyriform;Pharyngeal residue - posterior pharnyx;Pharyngeal residue - valleculae Pharyngeal Material enters airway, passes BELOW cords and not ejected out despite cough attempt by patient  CHL IP CERVICAL ESOPHAGEAL PHASE 08/29/2017 Cervical Esophageal Phase Impaired Nectar Teaspoon Reduced cricopharyngeal relaxation Puree Reduced cricopharyngeal relaxation Celia B. Quentin [facility], CCC-SLP Speech Language Pathologist [phone] Shonna Chock 08/29/2017, 10:49 AM               Assessment/Plan  Spondylosis of cervical region without myelopathy or radiculopathy Afebrile.Rates pain 8/10 though has improved per wife.continue on Tylenol 1000 mg tablet three times daily,Robaxin 250 mg tablet at bedtime.Increase Hydrocodone-APAP 5-325 mg tablet from one tablet every 4 hours as needed to 1-2 tablet every 4 hours as needed.1 tablet = moderate pain and 2 tablets = severe pain. Follow up with Neurosurgery as directed.continue to monitor will consider increase Robaxin to 500 mg tablet every six hours if pain still not under control.   Family/ staff Communication: Reviewed plan of care with patient,patient's wife, daughter and facility Nurse supervisor.  Labs/tests ordered: None   Dinah C Ngetich, NP

## 2017-09-12 NOTE — Telephone Encounter (Signed)
Daughter called and asked if there was anything else that could be done for his pain. She said he can't even do therapy due to the pain. Maybe increase the robaxin? Or pain med? I advised that she needed to contact the nurses in skilled and let them know what her concerns were, but that I would send this as a record of our conversation. She verbalized understanding and said she would call them too.

## 2017-09-14 ENCOUNTER — Encounter: Payer: Self-pay | Admitting: Nurse Practitioner

## 2017-09-17 ENCOUNTER — Encounter: Payer: Self-pay | Admitting: Internal Medicine

## 2017-09-18 ENCOUNTER — Non-Acute Institutional Stay (SKILLED_NURSING_FACILITY): Payer: Medicare Other | Admitting: Internal Medicine

## 2017-09-18 ENCOUNTER — Encounter: Payer: Self-pay | Admitting: Internal Medicine

## 2017-09-18 DIAGNOSIS — M47812 Spondylosis without myelopathy or radiculopathy, cervical region: Secondary | ICD-10-CM

## 2017-09-18 NOTE — Progress Notes (Signed)
Location:  [place] Room Number: 29 Place of Service:  SNF ([phone]) Provider:  Blanchie Serve, MD  Blanchie Serve, MD  Patient Care Team: Blanchie Serve, MD as PCP - General (Internal Medicine) Croitoru, Dani Gobble, MD as Attending Physician (Cardiology) Vevelyn Royals, MD as Consulting Physician (Ophthalmology) Franchot Gallo, MD as Consulting Physician (Urology) Tanda Rockers, MD as Consulting Physician (Pulmonary Disease) Allyn Kenner, MD (Dermatology) Deliah Goody, PA-C as Physician Assistant (Physician Assistant) Ngetich, Nelda Bucks, NP as Nurse Practitioner (Family Medicine)  Extended Emergency Contact Information Primary Emergency Contact: Zaccone,[name] Address: [address], [place] Phone: [phone] Mobile Phone: [phone] Relation: Spouse Secondary Emergency Contact: [name],Valladares  [place] Mobile Phone: [phone] Relation: Daughter  Code Status:  Full code  Goals of care: Advanced Directive information Advanced Directives 09/18/2017  Does Patient Have a Medical Advance Directive? Yes  Type of Paramedic of [place];Living will  Does patient want to make changes to medical advance directive? No - Patient declined  Copy of [place] in Chart? Yes  Would patient like information on creating a medical advance directive? -  Pre-existing out of facility DNR order (yellow form or pink MOST form) -     Chief Complaint  Patient presents with  . Acute Visit    Pain management    HPI:  Pt is a 84 y.o. male seen today for an acute visit for ongoing neck and upper back/ shoulder pain thought to be from cervical spondylosis. He is awaiting follow up with neurosurgery on 09/30/17. He is currently on tylenol 1000 mg tid, robaxin 250 mg qhs and norco 5-325 mg 1-2 tab every 4 hour as needed. He has received 1-2 tablet of norco in the past few  days. Per nursing, when they assess him, he denies pain for most part of the day. Per daughter, he complaints of pain to the family. He is seen in his room today with his wife and charge nurse present. His daughter requested nursing to have provider evaluate him for addition of tramadol and gabapentin for his pain management.    Past Medical History:  Diagnosis Date  . Arthritis    "minor, back and sometimes knees" (12/15/2012)  . Bradycardia    AFib/SSS s/p St Jude PPM 04/12/2008  . CAD (coronary artery disease) 12/30/2014   CABG (LIMA-LAD, SVG-RCA, SVG-OM in 1996).  07/2009 BMS to SVG-RCA. Cath in 04/2010 with patent stents   . Cardiomyopathy, ischemic 08/25/2012  . CHF (congestive heart failure) ([place])   . Chronic knee pain 12/03/2014  . Combined congestive systolic and diastolic heart failure ([place]) 02/02/2015   Hx EF 41%. BNP 96.8 02/21/15 Torsemide 04/06/15 Na 142, K 4.6, Bun 16, creat 0.89 04/20/14 BNP 111.7, Na 142, K 4.6, Bun 16, creat 0.9   . Depression with anxiety 02/02/2015   02/21/15 Hgb A1c 5.8 03/10/15 MMSE 30/30   . Dizziness, after diuretic asscoiated with hypotension and responded to fluid bolus 06/05/2011   04/28/15 US carotid R+L normal bilateral arterial velocities.    Marland Kitchen Dyspnea 08/11/2014   Followed in Pulmonary clinic/ [facility]/ [facility]  - 08/11/2014  Walked RA x 1 laps @ 185 ft each stopped due to fatigue/off balance/ slow pace/  no sob or desat  - PFT's  09/26/2014  FEV1 2.26 (85 % ) ratio 76  p no % improvement from saba with DLCO  67 % corrects to 93 % for alv volume      Since prev study 08/04/13 minimal change lung vol or dlco    . Embolic cerebral infarction ([place]) 12/06/2015  . Exertional shortness of breath    "sometimes walking" (12/15/2012)  . GERD (gastroesophageal reflux disease)   . Gout 02/09/2015  . Heart murmur    "just told I had one today" (12/15/2012)  . Hiatal hernia   . Hyperlipidemia   . Hypertension   . Hypothyroid   . Influenza A 03/10/2016  . Insomnia     . Melanoma of back ([place]) 1976  . Myocardial infarction [facility]) 1996; 2011   "both silent" (12/15/2012)  . Nonrheumatic aortic valve stenosis   . Orthostatic hypotension   . Osteoporosis, senile   . Pacemaker   . RBBB   . Restless leg 02/02/2015  . Right leg weakness 12/06/2015  . [facility] spotted fever   . S/P CABG x 4   . Sick sinus syndrome ([place]) 01/31/2014  . Sustained ventricular tachycardia ([place]) 07/27/2014  . Urinary retention 12/30/2014   Past Surgical History:  Procedure Laterality Date  . CARDIAC CATHETERIZATION  04/2010   LIMA to LAD patent,SVG to OM patent,no in-stnet restenosis RCA  . CATARACT EXTRACTION W/ INTRAOCULAR LENS  IMPLANT, BILATERAL Bilateral 2012  . CORONARY ANGIOPLASTY WITH STENT PLACEMENT  07/2009   bare metal stent to SVG to the RCA  . CORONARY ARTERY BYPASS GRAFT  1996   LIMA to LAD,SVG to RCA & SVG to OM  . INSERT / REPLACE / REMOVE PACEMAKER  2010  . MELANOMA EXCISION  05/1974 X2   "taken off my back" (12/15/2012)  . NM MYOVIEW LTD  06/2011   low risk  . PPM GENERATOR CHANGEOUT N/A 01/22/2017   Procedure: PPM GENERATOR CHANGEOUT;  Surgeon: [name], MD;  Location: [place] CV LAB;  Service: Cardiovascular;  Laterality: N/A;  . TONSILLECTOMY  1938  . TRANSURETHRAL RESECTION OF PROSTATE  1986  . US ECHOCARDIOGRAPHY  07/11/2009   EF 45-50%    Allergies  Allergen Reactions  . Altace [Ramipril] Cough  . Crestor [Rosuvastatin Calcium] Rash  . Penicillins Rash and Other (See Comments)    Has patient had a PCN reaction causing immediate rash, facial/tongue/throat swelling, SOB or lightheadedness with hypotension: Yes Has patient had a PCN reaction causing severe rash involving mucus membranes or skin necrosis: No Has patient had a PCN reaction that required hospitalization No Has patient had a PCN reaction occurring within the last 10 years: No If all of the above answers are "NO", then may proceed with Cephalosporin use.   . Sulfa  Antibiotics Rash    Outpatient Encounter Medications as of 09/18/2017  Medication Sig  . acetaminophen (TYLENOL) 500 MG tablet Take 1,000 mg by mouth 3 (three) times daily.  . Amino Acids-Protein Hydrolys (FEEDING SUPPLEMENT, PRO-STAT SUGAR FREE 64,) LIQD Take 30 mLs by mouth daily.  Marland Kitchen amiodarone (PACERONE) 100 MG tablet Take 1 tablet (100 mg total) by mouth daily.  Marland Kitchen aspirin EC 81 MG tablet Take 1 tablet (81 mg total) by mouth daily.  . Cholecalciferol (VITAMIN D3) 5000 UNITS CAPS Take 5,000 Units by mouth every Monday.   . clopidogrel (PLAVIX) 75 MG tablet TAKE 1 TABLET ONCE DAILY.  Marland Kitchen Droxidopa 100 MG CAPS Take 100 mg by mouth 3 (three) times daily.  Marland Kitchen guaiFENesin (ROBITUSSIN) 100 MG/5ML SOLN Take 5 mLs (100 mg total) by mouth every 4 (four) hours as needed for  cough or to loosen phlegm.  Marland Kitchen HYDROcodone-acetaminophen (NORCO/VICODIN) 5-325 MG tablet Take 1 tablet by mouth every 4 (four) hours as needed for severe pain.  Marland Kitchen HYDROcodone-acetaminophen (NORCO/VICODIN) 5-325 MG tablet Take 2 tablets by mouth every 4 (four) hours as needed for severe pain.  Marland Kitchen levothyroxine (SYNTHROID, LEVOTHROID) 100 MCG tablet Take 100 mcg by mouth daily before breakfast.  . Lidocaine 4 % PTCH Apply 1 patch topically daily. Left shoulder  . meclizine (ANTIVERT) 25 MG tablet Take 1 tablet (25 mg total) by mouth 2 (two) times daily as needed for dizziness.  . Melatonin 3 MG TABS Take 3 mg by mouth at bedtime.  . methocarbamol (ROBAXIN) 500 MG tablet Take 250 mg by mouth at bedtime. Stop date 09/25/17  . methocarbamol (ROBAXIN) 500 MG tablet Take 250 mg by mouth at bedtime as needed for muscle spasms. Start date 09/26/17  . midodrine (PROAMATINE) 10 MG tablet TAKE 1 TABLET THREE TIMES DAILY. TAKE LAST DOSE AT LEAST 6 HOURS BEFORE BEDTIME.  Marland Kitchen nystatin (MYCOSTATIN) 100000 UNIT/ML suspension Take 5 mLs by mouth 3 (three) times daily. Stop date 09/22/17  . pantoprazole (PROTONIX) 40 MG tablet TAKE 1 TABLET BY MOUTH TWICE  DAILY.  Marland Kitchen polyethylene glycol (MIRALAX / GLYCOLAX) packet Take 17 g by mouth daily. Hold for loose stool  . pravastatin (PRAVACHOL) 40 MG tablet Take 1 tablet (40 mg total) by mouth at bedtime.  Marland Kitchen rOPINIRole (REQUIP) 0.5 MG tablet TAKE 1 TABLET 3 TIMES A DAY AS NEEDED.  Marland Kitchen sennosides-docusate sodium (SENOKOT-S) 8.6-50 MG tablet Take 2 tablets by mouth at bedtime.   . torsemide (DEMADEX) 10 MG tablet Take 5 mg by mouth daily.   . traZODone (DESYREL) 50 MG tablet Take 1 tablet (50 mg total) by mouth at bedtime.  . [DISCONTINUED] acetaminophen (TYLENOL) 325 MG tablet Take 650 mg by mouth every 8 (eight) hours as needed for mild pain, moderate pain or headache.    No facility-administered encounter medications on file as of 09/18/2017.     Review of Systems  Constitutional: Negative for appetite change, chills and fever.  Respiratory: Negative for shortness of breath.   Cardiovascular: Negative for chest pain and palpitations.  Gastrointestinal: Negative for abdominal pain, nausea and vomiting.       Had a bowel movement yesterday  Musculoskeletal: Positive for arthralgias, back pain, gait problem and neck pain.  Skin: Negative for rash.  Neurological: Negative for light-headedness and headaches.  Hematological: Bruises/bleeds easily.  Psychiatric/Behavioral: Positive for confusion. Negative for behavioral problems.    Immunization History  Administered Date(s) Administered  . Influenza, High Dose Seasonal PF 11/01/2014, 11/13/2016  . Influenza-Unspecified 12/05/2013, 11/05/2014, 11/16/2015  . PPD Test 03/07/2014  . Pneumococcal Conjugate-13 10/05/2009  . Pneumococcal Polysaccharide-23 11/05/2005  . Pneumococcal-Unspecified 10/05/2009  . Tdap 08/02/2014  . Zoster 11/05/2005   Pertinent  Health Maintenance Due  Topic Date Due  . PNA vac Low Risk Adult (2 of 2 - PCV13) 10/06/2010  . INFLUENZA VACCINE  09/04/2017  . DEXA SCAN  12/14/2024   Fall Risk  10/02/2016 10/30/2015 09/05/2015  02/02/2015  Falls in the past year? No No No No  Risk for fall due to : - Impaired balance/gait - -   Functional Status Survey:    Vitals:   09/18/17 0953  BP: (!) 154/85  Pulse: 70  Resp: 20  Temp: (!) 97.5 F (36.4 C)  TempSrc: Oral  SpO2: 97%  Weight: 171 lb (77.6 kg)  Height: 5\' 10"  (1.778 m)  Body mass index is 24.54 kg/m.   Wt Readings from Last 3 Encounters:  09/18/17 171 lb (77.6 kg)  09/12/17 171 lb (77.6 kg)  09/08/17 171 lb (77.6 kg)   Physical Exam  Constitutional: No distress.  Frail, chronically ill appearing male  HENT:  Head: Normocephalic and atraumatic.  Neck: Neck supple.  Restricted ROM to his neck mainly with side rotation of his head, tightness noted to paracervical area with tenderness on palpation, no spinal tenderness in cervical region  Cardiovascular: Normal rate and regular rhythm.  Murmur heard. Pulmonary/Chest: Effort normal and breath sounds normal.  Abdominal: Soft. Bowel sounds are normal. There is no tenderness.  Musculoskeletal: He exhibits edema.  Limited ROM with left shoulder, can move all 4 extremities  Lymphadenopathy:    He has no cervical adenopathy.  Neurological: He is alert.  Oriented to person and place but needs reminders about recent events  Skin: Skin is warm and dry. He is not diaphoretic.  Bruise and ecchymoses to both arms    Labs reviewed: Recent Labs    08/26/17 0426 08/27/17 0423 08/29/17 1022 08/31/17 0342 09/04/17 1054 09/08/17 09/11/17 09/12/17  NA 139 142 140 139 140 137 136 136*  K 4.2 3.8 3.8 3.7 3.4* 4.4 4.2 4.2  CL 107 108 106 106 105  --   --   --   CO2 25 26 25 25 26   --   --   --   GLUCOSE 104* 109* 113* 120* 94  --   --   --   BUN 13 18 15 17 15 14 17   --   CREATININE 0.49* 0.57* 0.55* 0.51* 0.51* 0.61 0.59 0.6  CALCIUM 8.6* 8.5* 8.8* 8.3* 8.0* 8.6 8.2  --   MG 2.2 2.1  --   --   --   --   --   --   PHOS 3.4  --   --   --   --   --   --   --    Recent Labs    08/27/17 0423  09/04/17 1054 09/11/17 09/12/17  AST 22 33 20 20  ALT 17 45* 25 25  ALKPHOS 59 61 76 76  BILITOT 0.6 0.3 0.3  --   PROT 6.2* 5.8* 5.8  --   ALBUMIN 2.5* 2.4* 3.0  --    Recent Labs    04/07/17 2358  08/27/17 0423  08/31/17 0342 09/04/17 1054 09/08/17 09/11/17 09/12/17  WBC 8.6   < > 8.9   < > 8.1 12.2* 9.5 7.6 7.6  NEUTROABS 6.2  --  6.5  --   --  8.9*  --   --   --   HGB 13.7   < > 12.1*   < > 12.4* 12.8* 12.9 12.8 12.8*  HCT 41.1   < > 36.9*   < > 38.4* 39.2 38.9 38.8 39*  MCV 97.4   < > 97.1   < > 97.0 97.3 93.5 94.6  --   PLT 230   < > 243   < > 339 354  --   --  300   < > = values in this interval not displayed.   Lab Results  Component Value Date   TSH 1.125 08/26/2017   Lab Results  Component Value Date   HGBA1C 5.6 12/07/2015   Lab Results  Component Value Date   CHOL 103 05/05/2017   HDL 39 (L) 05/05/2017   LDLCALC 43 05/05/2017   TRIG 129  05/05/2017   CHOLHDL 2.6 05/05/2017    Significant Diagnostic Results in last 30 days:  Dg Chest 2 View  Result Date: 08/25/2017 CLINICAL DATA:  Weakness. EXAM: CHEST - 2 VIEW COMPARISON:  11/27/2016 FINDINGS: There is a large chronic hiatal hernia. There is slight atelectasis at the lung bases but the patient has taken a very shallow inspiration. No discrete infiltrates or effusions. Heart size is normal. CABG. Pacemaker in place. No acute bone abnormality. IMPRESSION: Mild bibasilar atelectasis, at least in part due to a shallow inspiration. Large chronic hiatal hernia. Electronically Signed   By: [name] M.D.   On: 08/25/2017 13:13   Dg Shoulder Right  Result Date: 08/25/2017 CLINICAL DATA:  84 year old male with bilateral shoulder pain. No injury. Initial encounter. EXAM: RIGHT SHOULDER - 2+ VIEW COMPARISON:  None. FINDINGS: Mild to moderate acromioclavicular joint and acromial humeral joint degenerative changes. No fracture or dislocation. Visualized lungs clear. IMPRESSION: Mild to moderate acromioclavicular  joint and acromial humeral joint degenerative changes. Electronically Signed   By: Genia Del M.D.   On: 08/25/2017 18:58   Ct Head Wo Contrast  Result Date: 08/25/2017 CLINICAL DATA:  Altered mental status and possible fall. EXAM: CT HEAD WITHOUT CONTRAST CT CERVICAL SPINE WITHOUT CONTRAST TECHNIQUE: Multidetector CT imaging of the head and cervical spine was performed following the standard protocol without intravenous contrast. Multiplanar CT image reconstructions of the cervical spine were also generated. COMPARISON:  Head CT 01/11/2016 FINDINGS: CT HEAD FINDINGS Brain: There is no mass, hemorrhage or extra-axial collection. There is generalized atrophy without lobar predilection. There is no acute or chronic infarction. There is hypoattenuation of the periventricular white matter, most commonly indicating chronic ischemic microangiopathy. Vascular: Atherosclerotic calcification of the internal carotid arteries at the skull base. No abnormal hyperdensity of the major intracranial arteries or dural venous sinuses. Skull: The visualized skull base, calvarium and extracranial soft tissues are normal. Sinuses/Orbits: No fluid levels or advanced mucosal thickening of the visualized paranasal sinuses. No mastoid or middle ear effusion. The orbits are normal. CT CERVICAL SPINE FINDINGS Alignment: Grade 1 anterolisthesis at C3-C4 and C4-C5. This is secondary to facet hypertrophy. Alignment is otherwise normal. Skull base and vertebrae: No acute fracture. Soft tissues and spinal canal: No prevertebral fluid or swelling. No visible canal hematoma. Disc levels: Fusion of the left C3-4 facet joint. Facet arthrosis is most severe at right C4-C6. No spinal canal stenosis. Upper chest: No pneumothorax, pulmonary nodule or pleural effusion. Other: Normal visualized paraspinal cervical soft tissues. IMPRESSION: 1. Chronic ischemic microangiopathy and generalized volume loss without acute intracranial abnormality. 2. No  acute fracture of the cervical spine. 3. Multilevel moderate-to-severe facet arthrosis with resultant spondylolisthesis. Electronically Signed   By: Ulyses Jarred M.D.   On: 08/25/2017 13:53   Ct Soft Tissue Neck W Contrast  Result Date: 08/29/2017 CLINICAL DATA:  Neck pain and BILATERAL shoulder pain. Difficulty swallowing. EXAM: CT NECK WITH CONTRAST TECHNIQUE: Multidetector CT imaging of the neck was performed using the standard protocol following the bolus administration of intravenous contrast. CONTRAST:  33mL OMNIPAQUE IOHEXOL 300 MG/ML  SOLN COMPARISON:  None. FINDINGS: Pharynx and larynx: Normal. No mass or swelling. Salivary glands: No inflammation, mass, or stone. Thyroid: Normal. Lymph nodes: None enlarged or abnormal density. Vascular: Atherosclerosis.  Vessel patency is established. Limited intracranial: Generalized atrophy. Visualized orbits: Barely visualized, no gross abnormality. Mastoids and visualized paranasal sinuses: No layering sinus or mastoid fluid. Skeleton: Spondylosis.  No worrisome osseous lesion. Upper chest: No pneumothorax.  Mild vascular congestion. No visible lung mass. Pacemaker, LEFT subclavian approach. Prior median sternotomy for CABG. Other: Healed IMPRESSION: No pharyngeal or neck inflammatory process is evident. Airway appears patent. No esophageal air-fluid level or significant neck mass or adenopathy is evident. Electronically Signed   By: Staci Righter M.D.   On: 08/29/2017 13:07   Ct Cervical Spine Wo Contrast  Result Date: 08/25/2017 CLINICAL DATA:  Altered mental status and possible fall. EXAM: CT HEAD WITHOUT CONTRAST CT CERVICAL SPINE WITHOUT CONTRAST TECHNIQUE: Multidetector CT imaging of the head and cervical spine was performed following the standard protocol without intravenous contrast. Multiplanar CT image reconstructions of the cervical spine were also generated. COMPARISON:  Head CT 01/11/2016 FINDINGS: CT HEAD FINDINGS Brain: There is no mass,  hemorrhage or extra-axial collection. There is generalized atrophy without lobar predilection. There is no acute or chronic infarction. There is hypoattenuation of the periventricular white matter, most commonly indicating chronic ischemic microangiopathy. Vascular: Atherosclerotic calcification of the internal carotid arteries at the skull base. No abnormal hyperdensity of the major intracranial arteries or dural venous sinuses. Skull: The visualized skull base, calvarium and extracranial soft tissues are normal. Sinuses/Orbits: No fluid levels or advanced mucosal thickening of the visualized paranasal sinuses. No mastoid or middle ear effusion. The orbits are normal. CT CERVICAL SPINE FINDINGS Alignment: Grade 1 anterolisthesis at C3-C4 and C4-C5. This is secondary to facet hypertrophy. Alignment is otherwise normal. Skull base and vertebrae: No acute fracture. Soft tissues and spinal canal: No prevertebral fluid or swelling. No visible canal hematoma. Disc levels: Fusion of the left C3-4 facet joint. Facet arthrosis is most severe at right C4-C6. No spinal canal stenosis. Upper chest: No pneumothorax, pulmonary nodule or pleural effusion. Other: Normal visualized paraspinal cervical soft tissues. IMPRESSION: 1. Chronic ischemic microangiopathy and generalized volume loss without acute intracranial abnormality. 2. No acute fracture of the cervical spine. 3. Multilevel moderate-to-severe facet arthrosis with resultant spondylolisthesis. Electronically Signed   By: Ulyses Jarred M.D.   On: 08/25/2017 13:53   Dg Shoulder Left  Result Date: 08/25/2017 CLINICAL DATA:  84 year old male with bilateral shoulder pain. No injury. Initial encounter. EXAM: LEFT SHOULDER - 2+ VIEW COMPARISON:  09/25/2006 MR. FINDINGS: Moderate acromioclavicular joint degenerative changes. Superiorly located humeral head consistent with rotator cuff tear with acromial humeral joint degenerative changes. No fracture or dislocation. Pacemaker  in place. Visualized lungs clear. IMPRESSION: Moderate acromioclavicular joint degenerative changes. Superiorly located humeral head consistent with rotator cuff tear with acromial humeral joint degenerative changes. Electronically Signed   By: Genia Del M.D.   On: 08/25/2017 19:00   Dg Swallowing Func-speech Pathology  Result Date: 09/01/2017 Objective Swallowing Evaluation: Type of Study: MBS-Modified Barium Swallow Study  Patient Details Name: ALONZA KNISLEY MRN: [id number] Date of Birth: 03-11-1926 Today's Date: 09/01/2017 Time: SLP Start Time (ACUTE ONLY): 1535 -SLP Stop Time (ACUTE ONLY): 1615 SLP Time Calculation (min) (ACUTE ONLY): 40 min Past Medical History: Past Medical History: Diagnosis Date . Arthritis   "minor, back and sometimes knees" (12/15/2012) . Bradycardia   AFib/SSS s/p St Jude PPM 04/12/2008 . CAD (coronary artery disease) 12/30/2014  CABG (LIMA-LAD, SVG-RCA, SVG-OM in 1996).  07/2009 BMS to SVG-RCA. Cath in 04/2010 with patent stents  . Cardiomyopathy, ischemic 08/25/2012 . CHF (congestive heart failure) ([place])  . Chronic knee pain 12/03/2014 . Combined congestive systolic and diastolic heart failure ([place]) 02/02/2015  Hx EF 41%. BNP 96.8 02/21/15 Torsemide 04/06/15 Na 142, K 4.6, Bun 16, creat 0.89 04/20/14 BNP 111.7,  Na 142, K 4.6, Bun 16, creat 0.9  . Depression with anxiety 02/02/2015  02/21/15 Hgb A1c 5.8 03/10/15 MMSE 30/30  . Dizziness, after diuretic asscoiated with hypotension and responded to fluid bolus 06/05/2011  04/28/15 US carotid R+L normal bilateral arterial velocities.   Marland Kitchen Dyspnea 08/11/2014  Followed in Pulmonary clinic/ [facility]/ [facility]  - 08/11/2014  Walked RA x 1 laps @ 185 ft each stopped due to fatigue/off balance/ slow pace/  no sob or desat  - PFT's  09/26/2014  FEV1 2.26 (85 % ) ratio 76  p no % improvement from saba with DLCO  67 % corrects to 93 % for alv volume      Since prev study 08/04/13 minimal change lung vol or dlco   . Embolic cerebral infarction ([place]) 12/06/2015 .  Exertional shortness of breath   "sometimes walking" (12/15/2012) . GERD (gastroesophageal reflux disease)  . Gout 02/09/2015 . Heart murmur   "just told I had one today" (12/15/2012) . Hiatal hernia  . Hyperlipidemia  . Hypertension  . Hypothyroid  . Influenza A 03/10/2016 . Insomnia  . Melanoma of back ([place]) 1976 . Myocardial infarction [facility]) 1996; 2011  "both silent" (12/15/2012) . Nonrheumatic aortic valve stenosis  . Orthostatic hypotension  . Osteoporosis, senile  . Pacemaker  . RBBB  . Restless leg 02/02/2015 . Right leg weakness 12/06/2015 . [facility] spotted fever  . S/P CABG x 4  . Sick sinus syndrome ([place]) 01/31/2014 . Sustained ventricular tachycardia ([place]) 07/27/2014 . Urinary retention 12/30/2014 Past Surgical History: Past Surgical History: Procedure Laterality Date . CARDIAC CATHETERIZATION  04/2010  LIMA to LAD patent,SVG to OM patent,no in-stnet restenosis RCA . CATARACT EXTRACTION W/ INTRAOCULAR LENS  IMPLANT, BILATERAL Bilateral 2012 . CORONARY ANGIOPLASTY WITH STENT PLACEMENT  07/2009  bare metal stent to SVG to the RCA . CORONARY ARTERY BYPASS GRAFT  1996  LIMA to LAD,SVG to RCA & SVG to OM . INSERT / REPLACE / REMOVE PACEMAKER  2010 . MELANOMA EXCISION  05/1974 X2  "taken off my back" (12/15/2012) . NM MYOVIEW LTD  06/2011  low risk . PPM GENERATOR CHANGEOUT N/A 01/22/2017  Procedure: PPM GENERATOR CHANGEOUT;  Surgeon: [name], MD;  Location: [place] CV LAB;  Service: Cardiovascular;  Laterality: N/A; . TONSILLECTOMY  1938 . TRANSURETHRAL RESECTION OF PROSTATE  1986 . US ECHOCARDIOGRAPHY  07/11/2009  EF 45-20% HPI: 84 year old male admitted 08/25/17 with pain and weakness, UTI. PMH: CAD/CABG, CHF, depression/anxiety, bradycardia, sick sinus syndrome, pacer, CVA, GERD, gout, heart murmer, [place] Spotted Fever, Melanoma, HTN, RLS, urinary retention, MI x2  Subjective: pt awake in bed, son in law and daughter Caryl Asp present Assessment / Plan / Recommendation CHL IP CLINICAL IMPRESSIONS  09/01/2017 Clinical Impression Pt with significantly improved swallow function compared to [facility] on Friday June 26th.  Mild dysphagia present without aspiration/penetration of any consistency tested (thin, nectar, pudding, cracker, barium tablet).  Mild weakness continues with mild residuals with intermittent sensation.  Pt able to clear residuals with cued dry swallow.  In addition, pharyngeal swallow triggered at pyriform sinus with thin = therefore rec pt sit fully upright for all po~  Of note, pt throat clearing during MBS without barium in larynx, he does appear with secretions retained in pharynx.  Will follow up for dysphagia management and RMST to address dysphagia/cough strength.  Family present and pt, and family educated to findings/recommendations during [place]. SLP Visit Diagnosis Dysphagia, oropharyngeal phase (R13.12) Attention and concentration deficit following --  Frontal lobe and executive function deficit following -- Impact on safety and function Mild aspiration risk   CHL IP TREATMENT RECOMMENDATION 09/01/2017 Treatment Recommendations Therapy as outlined in treatment plan below   Prognosis 09/01/2017 Prognosis for Safe Diet Advancement Good Barriers to Reach Goals -- Barriers/Prognosis Comment -- CHL IP DIET RECOMMENDATION 09/01/2017 SLP Diet Recommendations Dysphagia 3 (Mech soft) solids;Thin liquid Liquid Administration via -- Medication Administration Whole meds with puree Compensations Slow rate;Small sips/bites;Follow solids with liquid Postural Changes Seated upright at 90 degrees;Remain semi-upright after after feeds/meals (Comment)   CHL IP OTHER RECOMMENDATIONS 09/01/2017 Recommended Consults -- Oral Care Recommendations Oral care BID Other Recommendations --   CHL IP FOLLOW UP RECOMMENDATIONS 09/01/2017 Follow up Recommendations Skilled Nursing facility   [facility] IP FREQUENCY AND DURATION 09/01/2017 Speech Therapy Frequency (ACUTE ONLY) min 1 x/week Treatment Duration 1 week      CHL IP ORAL PHASE  09/01/2017 Oral Phase WFL Oral - Pudding Teaspoon -- Oral - Pudding Cup -- Oral - Honey Teaspoon -- Oral - Honey Cup -- Oral - Nectar Teaspoon -- Oral - Nectar Cup WFL Oral - Nectar Straw -- Oral - Thin Teaspoon WFL Oral - Thin Cup WFL Oral - Thin Straw WFL Oral - Puree WFL Oral - Mech Soft WFL Oral - Regular -- Oral - Multi-Consistency -- Oral - Pill Delayed oral transit Oral Phase - Comment --  CHL IP PHARYNGEAL PHASE 09/01/2017 Pharyngeal Phase -- Pharyngeal- Pudding Teaspoon -- Pharyngeal -- Pharyngeal- Pudding Cup -- Pharyngeal -- Pharyngeal- Honey Teaspoon -- Pharyngeal -- Pharyngeal- Honey Cup -- Pharyngeal -- Pharyngeal- Nectar Teaspoon WFL Pharyngeal -- Pharyngeal- Nectar Cup Reduced tongue base retraction Pharyngeal -- Pharyngeal- Nectar Straw -- Pharyngeal -- Pharyngeal- Thin Teaspoon Delayed swallow initiation-pyriform sinuses;Reduced tongue base retraction Pharyngeal -- Pharyngeal- Thin Cup Delayed swallow initiation-pyriform sinuses;Reduced tongue base retraction Pharyngeal -- Pharyngeal- Thin Straw Delayed swallow initiation-pyriform sinuses;Reduced tongue base retraction Pharyngeal -- Pharyngeal- Puree Reduced tongue base retraction Pharyngeal -- Pharyngeal- Mechanical Soft WFL Pharyngeal -- Pharyngeal- Regular -- Pharyngeal -- Pharyngeal- Multi-consistency -- Pharyngeal -- Pharyngeal- Pill WFL Pharyngeal -- Pharyngeal Comment --  CHL IP CERVICAL ESOPHAGEAL PHASE 09/01/2017 Cervical Esophageal Phase WFL Pudding Teaspoon -- Pudding Cup -- Honey Teaspoon -- Honey Cup -- Nectar Teaspoon -- Nectar Cup -- Nectar Straw -- Thin Teaspoon -- Thin Cup -- Thin Straw -- Puree -- Mechanical Soft -- Regular -- Multi-consistency -- Pill -- Cervical Esophageal Comment -- Macario Golds 09/01/2017, 7:45 PM  Luanna Salk, MS [facility] SLP [phone]             Dg Swallowing Func-speech Pathology  Result Date: 08/29/2017 Objective Swallowing Evaluation: Type of Study: MBS-Modified Barium Swallow Study  Patient Details  Name: SLATER MCMANAMAN MRN: [id number] Date of Birth: Jan 08, 1927 Today's Date: 08/29/2017 Time: SLP Start Time (ACUTE ONLY): 0850 -SLP Stop Time (ACUTE ONLY): 1015 SLP Time Calculation (min) (ACUTE ONLY): 85 min Past Medical History: Past Medical History: Diagnosis Date . Arthritis   "minor, back and sometimes knees" (12/15/2012) . Bradycardia   AFib/SSS s/p St Jude PPM 04/12/2008 . CAD (coronary artery disease) 12/30/2014  CABG (LIMA-LAD, SVG-RCA, SVG-OM in 1996).  07/2009 BMS to SVG-RCA. Cath in 04/2010 with patent stents  . Cardiomyopathy, ischemic 08/25/2012 . CHF (congestive heart failure) ([place])  . Chronic knee pain 12/03/2014 . Combined congestive systolic and diastolic heart failure ([place]) 02/02/2015  Hx EF 41%. BNP 96.8 02/21/15 Torsemide 04/06/15 Na 142, K 4.6, Bun 16, creat 0.89 04/20/14 BNP 111.7, Na 142, K 4.6,  Bun 16, creat 0.9  . Depression with anxiety 02/02/2015  02/21/15 Hgb A1c 5.8 03/10/15 MMSE 30/30  . Dizziness, after diuretic asscoiated with hypotension and responded to fluid bolus 06/05/2011  04/28/15 US carotid R+L normal bilateral arterial velocities.   Marland Kitchen Dyspnea 08/11/2014  Followed in Pulmonary clinic/ [facility]/ [place]  - 08/11/2014  Walked RA x 1 laps @ 185 ft each stopped due to fatigue/off balance/ slow pace/  no sob or desat  - PFT's  09/26/2014  FEV1 2.26 (85 % ) ratio 76  p no % improvement from saba with DLCO  67 % corrects to 93 % for alv volume      Since prev study 08/04/13 minimal change lung vol or dlco   . Embolic cerebral infarction ([place]) 12/06/2015 . Exertional shortness of breath   "sometimes walking" (12/15/2012) . GERD (gastroesophageal reflux disease)  . Gout 02/09/2015 . Heart murmur   "just told I had one today" (12/15/2012) . Hiatal hernia  . Hyperlipidemia  . Hypertension  . Hypothyroid  . Influenza A 03/10/2016 . Insomnia  . Melanoma of back ([place]) 1976 . Myocardial infarction [facility]) 1996; 2011  "both silent" (12/15/2012) . Nonrheumatic aortic valve stenosis  . Orthostatic hypotension  .  Osteoporosis, senile  . Pacemaker  . RBBB  . Restless leg 02/02/2015 . Right leg weakness 12/06/2015 . [facility] spotted fever  . S/P CABG x 4  . Sick sinus syndrome ([place]) 01/31/2014 . Sustained ventricular tachycardia ([place]) 07/27/2014 . Urinary retention 12/30/2014 Past Surgical History: Past Surgical History: Procedure Laterality Date . CARDIAC CATHETERIZATION  04/2010  LIMA to LAD patent,SVG to OM patent,no in-stnet restenosis RCA . CATARACT EXTRACTION W/ INTRAOCULAR LENS  IMPLANT, BILATERAL Bilateral 2012 . CORONARY ANGIOPLASTY WITH STENT PLACEMENT  07/2009  bare metal stent to SVG to the RCA . CORONARY ARTERY BYPASS GRAFT  1996  LIMA to LAD,SVG to RCA & SVG to OM . INSERT / REPLACE / REMOVE PACEMAKER  2010 . MELANOMA EXCISION  05/1974 X2  "taken off my back" (12/15/2012) . NM MYOVIEW LTD  06/2011  low risk . PPM GENERATOR CHANGEOUT N/A 01/22/2017  Procedure: PPM GENERATOR CHANGEOUT;  Surgeon: [name], MD;  Location: [place];  Service: Cardiovascular;  Laterality: N/A; . TONSILLECTOMY  1938 . TRANSURETHRAL RESECTION OF PROSTATE  1986 . US ECHOCARDIOGRAPHY  07/11/2009  EF 45-77% HPI: 84 year old male admitted 08/25/17 with pain and weakness, UTI. PMH: CAD/CABG, CHF, depression/anxiety, bradycardia, sick sinus syndrome, pacer, CVA, GERD, gout, heart [place], [place] Spotted Fever, Melanoma, HTN, RLS, urinary retention, MI x2  Subjective: Pt and wife were seen in radiology for completion of MBS to objectively assess swallow function and safety. Pt was noted to clear his throat on a continuous basis prior to po trials. Significant pain reported - 8/10 at rest, 14/10 with swallow Assessment / Plan / Recommendation CHL IP CLINICAL IMPRESSIONS 08/29/2017 Clinical Impression Limited MBS completed, due to severity of pharyngeal dysphagia. Extensive education was provided before and after this study. Pt reports tolerance of regular diet and thin liquids prior to admit, without difficulty or odynophagia. At  this time, pt reports generalized pain 8/10, with significant odynophagia (14/10). Prior to start of po presentations, pt was noted to exhibit a wet, thick voice quality and frequent throat clearing, raising concern for poor management of secretions. Suction was set up; oral care was completed to minimize bacterial load given high risk of aspiration. Oral care effectively removed thick secretions from soft palate  and dried secretions from the oral cavity. Small (1/2  to 1 tsp) boluses of nectar thick liquid were provided individually via spoon, with discoordinated oral control and manipulation noted, as well as premature spillage over the tongue base, delayed swallow reflex, penetration and aspiration after the swallow, and post-swallow residue. Reflexive cough was elicited in response to aspiration. A small (1/8 tsp) bolus of puree was then given. Following discoordinated oral prep and propulsion, the bolus was noted to spill to the pyriform sinus prior to triggering the swallow reflex. Aspiration of pyriform stasis was noted, with cough demonstrated. Study was terminated at this point given exceptionally high risk of aspiration. SLP reviewed MBS results and provided extensive education on the significance of pt's swallowing deficit, need for NPO status at this time, and possible need to consider nonoral feeding methods.  Recommend consideration of Palliative Care consult to facilitate establishment of appropriate goals of care. ST will follow acutely for education, trial of dysphagia therapy (which may be a challenge given severity of odynophagia).  SLP Visit Diagnosis Dysphagia, pharyngeal phase (R13.13) Impact on safety and function Risk for inadequate nutrition/hydration;Severe aspiration risk   CHL IP TREATMENT RECOMMENDATION 08/29/2017 Treatment Recommendations Therapy as outlined in treatment plan below   Prognosis 08/29/2017 Prognosis for Safe Diet Advancement Fair Barriers to Reach Goals Advanced age,  multiple comorbidities CHL IP DIET RECOMMENDATION 08/29/2017 SLP Diet Recommendations NPO; Alternative means - temporary   Medication Administration Via alternative means   Postural Changes Recommend HOB remain at 45 degrees at all times, given poor secretion management   CHL IP OTHER RECOMMENDATIONS 08/29/2017 Recommended Consults Palliative Care consult for [place] Oral Care Recommendations Oral care QID;Staff/trained caregiver to provide oral care Other Recommendations Have oral suction available     CHL IP FREQUENCY AND DURATION 08/29/2017 Speech Therapy Frequency (ACUTE ONLY) min 2x/week Treatment Duration 2 weeks;1 week      CHL IP ORAL PHASE 08/29/2017 Oral Phase Slow bolus formation, discoordinated posterior propulsion  CHL IP PHARYNGEAL PHASE 08/29/2017 Pharyngeal Phase Impaired Pharyngeal- Nectar Teaspoon Delayed swallow initiation-pyriform sinuses;Delayed swallow initiation-vallecula;Reduced pharyngeal peristalsis;Reduced epiglottic inversion;Reduced anterior laryngeal mobility;Reduced laryngeal elevation;Reduced airway/laryngeal closure;Reduced tongue base retraction;Penetration/Apiration after swallow;Trace aspiration;Pharyngeal residue - valleculae;Pharyngeal residue - posterior pharnyx;Pharyngeal residue - pyriform Pharyngeal Material enters airway, passes BELOW cords and not ejected out despite cough attempt by patient Pharyngeal- Puree Delayed swallow initiation-vallecula;Delayed swallow initiation-pyriform sinuses;Reduced pharyngeal peristalsis;Reduced epiglottic inversion;Reduced airway/laryngeal closure;Reduced tongue base retraction;Reduced laryngeal elevation;Reduced anterior laryngeal mobility;Penetration/Apiration after swallow;Trace aspiration;Pharyngeal residue - pyriform;Pharyngeal residue - posterior pharnyx;Pharyngeal residue - valleculae Pharyngeal Material enters airway, passes BELOW cords and not ejected out despite cough attempt by patient  CHL IP CERVICAL ESOPHAGEAL PHASE 08/29/2017 Cervical  Esophageal Phase Impaired Nectar Teaspoon Reduced cricopharyngeal relaxation Puree Reduced cricopharyngeal relaxation Celia B. Quentin Ore [facility], CCC-SLP Speech Language Pathologist [phone] Shonna Chock 08/29/2017, 10:49 AM               Assessment/Plan  1. Spondylosis of cervical region without myelopathy or radiculopathy CT cervical spine showed grade 1 anterolisthesis at C3-4 and C4-5, fusion of left C3-4 facet joint and facet arthrosis at right C4-6, no spinal canal stenosis. He is s/p tapering course of prednisone without much relief. Continue tylenol 1000 mg tid. With the neck muscle tightness, change robaxin to 250 mg bid for now. Also encouraged pt to ask for norco 1-2 tablet every 4 hour as needed for pain as pt has not been asking for pain medication despite of being in pain. Conversation with nursing to  check on patient and assess pain level every 4 hours while awake as well. Pending follow up with neurosurgery and continue to work with PT and OT services. Will not add additional pain medication as will assess on current pain regimen first. Will defer gabapentin for now as pt denies any numbness or tingling.    Family/ staff Communication: reviewed care plan with patient, his wife and charge nurse.    Labs/tests ordered:  none  Blanchie Serve, MD Internal Medicine [facility] [address] [place], [place] [address] Cell Phone (Monday-Friday 8 am - 5 pm): [phone] On Call: [phone] and follow prompts after 5 pm and on weekends Office Phone: [phone] Office Fax: [phone]

## 2017-09-22 LAB — C-REACTIVE PROTEIN: CRP: 5.4

## 2017-09-22 LAB — SEDIMENTATION RATE: Sedimentation Rate-Westergren: 29

## 2017-09-23 ENCOUNTER — Encounter: Payer: Self-pay | Admitting: *Deleted

## 2017-09-25 ENCOUNTER — Non-Acute Institutional Stay (SKILLED_NURSING_FACILITY): Payer: Medicare Other | Admitting: Internal Medicine

## 2017-09-25 ENCOUNTER — Encounter: Payer: Self-pay | Admitting: Internal Medicine

## 2017-09-25 DIAGNOSIS — M75102 Unspecified rotator cuff tear or rupture of left shoulder, not specified as traumatic: Secondary | ICD-10-CM | POA: Diagnosis not present

## 2017-09-25 DIAGNOSIS — M47812 Spondylosis without myelopathy or radiculopathy, cervical region: Secondary | ICD-10-CM

## 2017-09-25 DIAGNOSIS — M7989 Other specified soft tissue disorders: Secondary | ICD-10-CM | POA: Diagnosis not present

## 2017-09-25 NOTE — Progress Notes (Signed)
Location:  [facility] Room Number: 29 Place of Service:  SNF ([phone]) Provider:  Blanchie Serve, MD  Blanchie Serve, MD  Patient Care Team: Blanchie Serve, MD as PCP - General (Internal Medicine) Croitoru, Dani Gobble, MD as Attending Physician (Cardiology) Vevelyn Royals, MD as Consulting Physician (Ophthalmology) Franchot Gallo, MD as Consulting Physician (Urology) Tanda Rockers, MD as Consulting Physician (Pulmonary Disease) Allyn Kenner, MD (Dermatology) Deliah Goody, PA-C as Physician Assistant (Physician Assistant) Ngetich, Nelda Bucks, NP as Nurse Practitioner (Family Medicine)  Extended Emergency Contact Information Primary Emergency Contact: Cavendish,[name] Address: [address]          [place] Phone: [phone] Mobile Phone: [phone] Relation: Spouse Secondary Emergency Contact: [name],Spohr  [place] Mobile Phone: [phone] Relation: Daughter   Goals of care: Advanced Directive information Advanced Directives 09/25/2017  Does Patient Have a Medical Advance Directive? Yes  Type of Paramedic of [place];Living will  Does patient want to make changes to medical advance directive? No - Patient declined  Copy of [place] in Chart? Yes  Would patient like information on creating a medical advance directive? -  Pre-existing out of facility DNR order (yellow form or pink MOST form) -     Chief Complaint  Patient presents with  . Acute Visit    Swelling to left elbow    HPI:  Pt is a 84 y.o. male seen today for an acute visit for swelling to left elbow. Staff noticed this yesterday. Pt seen in his room with charge nurse. He denies any known trauma/ injury to left side. Pt complaints of some discomfort to his left shoulder but denies any pain to left elbow. No fever reported by nursing. Pt complaints of ongoing pain to his neck with limitation in  rotating his neck.    Past Medical History:  Diagnosis Date  . Arthritis    "minor, back and sometimes knees" (12/15/2012)  . Bradycardia    AFib/SSS s/p St Jude PPM 04/12/2008  . CAD (coronary artery disease) 12/30/2014   CABG (LIMA-LAD, SVG-RCA, SVG-OM in 1996).  07/2009 BMS to SVG-RCA. Cath in 04/2010 with patent stents   . Cardiomyopathy, ischemic 08/25/2012  . CHF (congestive heart failure) ([place])   . Chronic knee pain 12/03/2014  . Combined congestive systolic and diastolic heart failure ([place]) 02/02/2015   Hx EF 41%. BNP 96.8 02/21/15 Torsemide 04/06/15 Na 142, K 4.6, Bun 16, creat 0.89 04/20/14 BNP 111.7, Na 142, K 4.6, Bun 16, creat 0.9   . Depression with anxiety 02/02/2015   02/21/15 Hgb A1c 5.8 03/10/15 MMSE 30/30   . Dizziness, after diuretic asscoiated with hypotension and responded to fluid bolus 06/05/2011   04/28/15 US carotid R+L normal bilateral arterial velocities.    Marland Kitchen Dyspnea 08/11/2014   Followed in Pulmonary clinic/ [facility]/ [place]  - 08/11/2014  Walked RA x 1 laps @ 185 ft each stopped due to fatigue/off balance/ slow pace/  no sob or desat  - PFT's  09/26/2014  FEV1 2.26 (85 % ) ratio 76  p no % improvement from saba with DLCO  67 % corrects to 93 % for alv volume      Since prev study 08/04/13 minimal change lung vol or dlco    . Embolic cerebral infarction ([place]) 12/06/2015  . Exertional shortness of breath    "sometimes walking" (12/15/2012)  . GERD (gastroesophageal reflux disease)   . Gout 02/09/2015  .  Heart murmur    "just told I had one today" (12/15/2012)  . Hiatal hernia   . Hyperlipidemia   . Hypertension   . Hypothyroid   . Influenza A 03/10/2016  . Insomnia   . Melanoma of back ([place]) 1976  . Myocardial infarction [facility]) 1996; 2011   "both silent" (12/15/2012)  . Nonrheumatic aortic valve stenosis   . Orthostatic hypotension   . Osteoporosis, senile   . Pacemaker   . RBBB   . Restless leg 02/02/2015  . Right leg weakness 12/06/2015  . [facility]  spotted fever   . S/P CABG x 4   . Sick sinus syndrome ([place]) 01/31/2014  . Sustained ventricular tachycardia ([place]) 07/27/2014  . Urinary retention 12/30/2014   Past Surgical History:  Procedure Laterality Date  . CARDIAC CATHETERIZATION  04/2010   LIMA to LAD patent,SVG to OM patent,no in-stnet restenosis RCA  . CATARACT EXTRACTION W/ INTRAOCULAR LENS  IMPLANT, BILATERAL Bilateral 2012  . CORONARY ANGIOPLASTY WITH STENT PLACEMENT  07/2009   bare metal stent to SVG to the RCA  . CORONARY ARTERY BYPASS GRAFT  1996   LIMA to LAD,SVG to RCA & SVG to OM  . INSERT / REPLACE / REMOVE PACEMAKER  2010  . MELANOMA EXCISION  05/1974 X2   "taken off my back" (12/15/2012)  . NM MYOVIEW LTD  06/2011   low risk  . PPM GENERATOR CHANGEOUT N/A 01/22/2017   Procedure: PPM GENERATOR CHANGEOUT;  Surgeon: [name], MD;  Location: [place] CV LAB;  Service: Cardiovascular;  Laterality: N/A;  . TONSILLECTOMY  1938  . TRANSURETHRAL RESECTION OF PROSTATE  1986  . US ECHOCARDIOGRAPHY  07/11/2009   EF 45-50%    Allergies  Allergen Reactions  . Altace [Ramipril] Cough  . Crestor [Rosuvastatin Calcium] Rash  . Penicillins Rash and Other (See Comments)    Has patient had a PCN reaction causing immediate rash, facial/tongue/throat swelling, SOB or lightheadedness with hypotension: Yes Has patient had a PCN reaction causing severe rash involving mucus membranes or skin necrosis: No Has patient had a PCN reaction that required hospitalization No Has patient had a PCN reaction occurring within the last 10 years: No If all of the above answers are "NO", then may proceed with Cephalosporin use.   . Sulfa Antibiotics Rash    Outpatient Encounter Medications as of 09/25/2017  Medication Sig  . acetaminophen (TYLENOL) 500 MG tablet Take 1,000 mg by mouth 3 (three) times daily.  . Amino Acids-Protein Hydrolys (FEEDING SUPPLEMENT, PRO-STAT SUGAR FREE 64,) LIQD Take 30 mLs by mouth daily.  Marland Kitchen amiodarone  (PACERONE) 100 MG tablet Take 1 tablet (100 mg total) by mouth daily.  Marland Kitchen aspirin EC 81 MG tablet Take 1 tablet (81 mg total) by mouth daily.  . Cholecalciferol (VITAMIN D3) 5000 UNITS CAPS Take 5,000 Units by mouth every Monday.   . clopidogrel (PLAVIX) 75 MG tablet TAKE 1 TABLET ONCE DAILY.  Marland Kitchen Droxidopa 100 MG CAPS Take 100 mg by mouth 3 (three) times daily.  Marland Kitchen guaiFENesin (ROBITUSSIN) 100 MG/5ML SOLN Take 5 mLs (100 mg total) by mouth every 4 (four) hours as needed for cough or to loosen phlegm.  Marland Kitchen HYDROcodone-acetaminophen (NORCO/VICODIN) 5-325 MG tablet Take 1 tablet by mouth every 4 (four) hours as needed for severe pain.  Marland Kitchen HYDROcodone-acetaminophen (NORCO/VICODIN) 5-325 MG tablet Take 2 tablets by mouth every 4 (four) hours as needed for severe pain.  Marland Kitchen levothyroxine (SYNTHROID, LEVOTHROID) 100 MCG tablet Take 100 mcg by  mouth daily before breakfast.  . Lidocaine 4 % PTCH Apply 1 patch topically daily. Left shoulder  . meclizine (ANTIVERT) 25 MG tablet Take 1 tablet (25 mg total) by mouth 2 (two) times daily as needed for dizziness.  . Melatonin 3 MG TABS Take 3 mg by mouth at bedtime.  . methocarbamol (ROBAXIN) 500 MG tablet Take 250 mg by mouth 2 (two) times daily.   . midodrine (PROAMATINE) 10 MG tablet TAKE 1 TABLET THREE TIMES DAILY. TAKE LAST DOSE AT LEAST 6 HOURS BEFORE BEDTIME.  . pantoprazole (PROTONIX) 40 MG tablet TAKE 1 TABLET BY MOUTH TWICE DAILY.  Marland Kitchen polyethylene glycol (MIRALAX / GLYCOLAX) packet Take 17 g by mouth daily. Hold for loose stool  . pravastatin (PRAVACHOL) 40 MG tablet Take 1 tablet (40 mg total) by mouth at bedtime.  Marland Kitchen rOPINIRole (REQUIP) 0.5 MG tablet TAKE 1 TABLET 3 TIMES A DAY AS NEEDED.  Marland Kitchen sennosides-docusate sodium (SENOKOT-S) 8.6-50 MG tablet Take 2 tablets by mouth at bedtime.   . torsemide (DEMADEX) 10 MG tablet Take 5 mg by mouth daily.   . traZODone (DESYREL) 50 MG tablet Take 1 tablet (50 mg total) by mouth at bedtime.  . [DISCONTINUED] methocarbamol  (ROBAXIN) 500 MG tablet Take 250 mg by mouth at bedtime as needed for muscle spasms. Start date 09/26/17  . [DISCONTINUED] nystatin (MYCOSTATIN) 100000 UNIT/ML suspension Take 5 mLs by mouth 3 (three) times daily. Stop date 09/22/17   No facility-administered encounter medications on file as of 09/25/2017.     Review of Systems  Constitutional: Negative for appetite change, chills and fever.  Respiratory: Negative for shortness of breath.   Cardiovascular: Negative for chest pain and palpitations.  Gastrointestinal: Negative for abdominal pain, nausea and vomiting.  Musculoskeletal: Positive for arthralgias, back pain, gait problem and neck pain.  Neurological: Negative for dizziness and headaches.  Hematological: Bruises/bleeds easily.  Psychiatric/Behavioral: Negative for behavioral problems.    Immunization History  Administered Date(s) Administered  . Influenza, High Dose Seasonal PF 11/01/2014, 11/13/2016  . Influenza-Unspecified 12/05/2013, 11/05/2014, 11/16/2015  . PPD Test 03/07/2014  . Pneumococcal Conjugate-13 10/05/2009  . Pneumococcal Polysaccharide-23 11/05/2005  . Pneumococcal-Unspecified 10/05/2009  . Tdap 08/02/2014  . Zoster 11/05/2005   Pertinent  Health Maintenance Due  Topic Date Due  . PNA vac Low Risk Adult (2 of 2 - PCV13) 10/06/2010  . INFLUENZA VACCINE  09/04/2017  . DEXA SCAN  12/14/2024   Fall Risk  10/02/2016 10/30/2015 09/05/2015 02/02/2015  Falls in the past year? No No No No  Risk for fall due to : - Impaired balance/gait - -   Functional Status Survey:    Vitals:   09/25/17 1042  BP: 130/83  Pulse: 69  Resp: 20  Temp: (!) 97.5 F (36.4 C)  TempSrc: Oral  SpO2: 93%  Weight: 171 lb (77.6 kg)  Height: 5\' 10"  (1.778 m)   Body mass index is 24.54 kg/m.   Wt Readings from Last 3 Encounters:  09/25/17 171 lb (77.6 kg)  09/18/17 171 lb (77.6 kg)  09/12/17 171 lb (77.6 kg)    Physical Exam  Constitutional: No distress.  Frail,  chronically ill appearing elderly male  HENT:  Head: Normocephalic and atraumatic.  Mouth/Throat: Oropharynx is clear and moist.  Eyes: Pupils are equal, round, and reactive to light. Conjunctivae are normal. Right eye exhibits no discharge. Left eye exhibits no discharge.  Neck: Neck supple.  Able to turn his neck to sides but complaints of discomfort. On chart  review, has been asking for norco   Cardiovascular: Normal rate and regular rhythm.  Murmur heard. Pulmonary/Chest: Effort normal and breath sounds normal. He has no wheezes. He has no rales.  Abdominal: Soft. Bowel sounds are normal. There is no tenderness.  Musculoskeletal:  Able to move both his upper extremities right > left, swelling around left elbow, left arm edema > right arm, no erythema or tenderness present, good radial and brachial pulse.   Lymphadenopathy:    He has no cervical adenopathy.  Neurological: He is alert.  Oriented to person and place  Skin: Skin is warm and dry. He is not diaphoretic.  Bruising to both arms  Psychiatric: He has a normal mood and affect.    Labs reviewed: Recent Labs    08/26/17 0426 08/27/17 0423 08/29/17 1022 08/31/17 0342 09/04/17 1054 09/08/17 09/11/17 09/12/17  NA 139 142 140 139 140 137 136 136*  K 4.2 3.8 3.8 3.7 3.4* 4.4 4.2 4.2  CL 107 108 106 106 105  --   --   --   CO2 25 26 25 25 26   --   --   --   GLUCOSE 104* 109* 113* 120* 94  --   --   --   BUN 13 18 15 17 15 14 17   --   CREATININE 0.49* 0.57* 0.55* 0.51* 0.51* 0.61 0.59 0.6  CALCIUM 8.6* 8.5* 8.8* 8.3* 8.0* 8.6 8.2  --   MG 2.2 2.1  --   --   --   --   --   --   PHOS 3.4  --   --   --   --   --   --   --    Recent Labs    08/27/17 0423 09/04/17 1054 09/11/17 09/12/17  AST 22 33 20 20  ALT 17 45* 25 25  ALKPHOS 59 61 76 76  BILITOT 0.6 0.3 0.3  --   PROT 6.2* 5.8* 5.8  --   ALBUMIN 2.5* 2.4* 3.0  --    Recent Labs    04/07/17 2358  08/27/17 0423  08/31/17 0342 09/04/17 1054 09/08/17 09/11/17  09/12/17  WBC 8.6   < > 8.9   < > 8.1 12.2* 9.5 7.6 7.6  NEUTROABS 6.2  --  6.5  --   --  8.9*  --   --   --   HGB 13.7   < > 12.1*   < > 12.4* 12.8* 12.9 12.8 12.8*  HCT 41.1   < > 36.9*   < > 38.4* 39.2 38.9 38.8 39*  MCV 97.4   < > 97.1   < > 97.0 97.3 93.5 94.6  --   PLT 230   < > 243   < > 339 354  --   --  300   < > = values in this interval not displayed.   Lab Results  Component Value Date   TSH 1.125 08/26/2017   Lab Results  Component Value Date   HGBA1C 5.6 12/07/2015   Lab Results  Component Value Date   CHOL 103 05/05/2017   HDL 39 (L) 05/05/2017   LDLCALC 43 05/05/2017   TRIG 129 05/05/2017   CHOLHDL 2.6 05/05/2017    Significant Diagnostic Results in last 30 days:  Ct Soft Tissue Neck W Contrast  Result Date: 08/29/2017 CLINICAL DATA:  Neck pain and BILATERAL shoulder pain. Difficulty swallowing. EXAM: CT NECK WITH CONTRAST TECHNIQUE: Multidetector CT imaging of the neck was  performed using the standard protocol following the bolus administration of intravenous contrast. CONTRAST:  69mL OMNIPAQUE IOHEXOL 300 MG/ML  SOLN COMPARISON:  None. FINDINGS: Pharynx and larynx: Normal. No mass or swelling. Salivary glands: No inflammation, mass, or stone. Thyroid: Normal. Lymph nodes: None enlarged or abnormal density. Vascular: Atherosclerosis.  Vessel patency is established. Limited intracranial: Generalized atrophy. Visualized orbits: Barely visualized, no gross abnormality. Mastoids and visualized paranasal sinuses: No layering sinus or mastoid fluid. Skeleton: Spondylosis.  No worrisome osseous lesion. Upper chest: No pneumothorax. Mild vascular congestion. No visible lung mass. Pacemaker, LEFT subclavian approach. Prior median sternotomy for CABG. Other: Healed IMPRESSION: No pharyngeal or neck inflammatory process is evident. Airway appears patent. No esophageal air-fluid level or significant neck mass or adenopathy is evident. Electronically Signed   By: Staci Righter M.D.    On: 08/29/2017 13:07   Dg Swallowing Func-speech Pathology  Result Date: 09/01/2017 Objective Swallowing Evaluation: Type of Study: MBS-Modified Barium Swallow Study  Patient Details Name: Adam [name] MRN: [id number] Date of Birth: 03-09-1926 Today's Date: 09/01/2017 Time: SLP Start Time (ACUTE ONLY): 1535 -SLP Stop Time (ACUTE ONLY): 1615 SLP Time Calculation (min) (ACUTE ONLY): 40 min Past Medical History: Past Medical History: Diagnosis Date . Arthritis   "minor, back and sometimes knees" (12/15/2012) . Bradycardia   AFib/SSS s/p St Jude PPM 04/12/2008 . CAD (coronary artery disease) 12/30/2014  CABG (LIMA-LAD, SVG-RCA, SVG-OM in 1996).  07/2009 BMS to SVG-RCA. Cath in 04/2010 with patent stents  . Cardiomyopathy, ischemic 08/25/2012 . CHF (congestive heart failure) ([place])  . Chronic knee pain 12/03/2014 . Combined congestive systolic and diastolic heart failure ([place]) 02/02/2015  Hx EF 41%. BNP 96.8 02/21/15 Torsemide 04/06/15 Na 142, K 4.6, Bun 16, creat 0.89 04/20/14 BNP 111.7, Na 142, K 4.6, Bun 16, creat 0.9  . Depression with anxiety 02/02/2015  02/21/15 Hgb A1c 5.8 03/10/15 MMSE 30/30  . Dizziness, after diuretic asscoiated with hypotension and responded to fluid bolus 06/05/2011  04/28/15 US carotid R+L normal bilateral arterial velocities.   Marland Kitchen Dyspnea 08/11/2014  Followed in Pulmonary clinic/ [facility]/ [place]  - 08/11/2014  Walked RA x 1 laps @ 185 ft each stopped due to fatigue/off balance/ slow pace/  no sob or desat  - PFT's  09/26/2014  FEV1 2.26 (85 % ) ratio 76  p no % improvement from saba with DLCO  67 % corrects to 93 % for alv volume      Since prev study 08/04/13 minimal change lung vol or dlco   . Embolic cerebral infarction ([place]) 12/06/2015 . Exertional shortness of breath   "sometimes walking" (12/15/2012) . GERD (gastroesophageal reflux disease)  . Gout 02/09/2015 . Heart murmur   "just told I had one today" (12/15/2012) . Hiatal hernia  . Hyperlipidemia  . Hypertension  . Hypothyroid  . Influenza A  03/10/2016 . Insomnia  . Melanoma of back ([place]) 1976 . Myocardial infarction [facility]) 1996; 2011  "both silent" (12/15/2012) . Nonrheumatic aortic valve stenosis  . Orthostatic hypotension  . Osteoporosis, senile  . Pacemaker  . RBBB  . Restless leg 02/02/2015 . Right leg weakness 12/06/2015 . [facility] spotted fever  . S/P CABG x 4  . Sick sinus syndrome ([place]) 01/31/2014 . Sustained ventricular tachycardia ([place]) 07/27/2014 . Urinary retention 12/30/2014 Past Surgical History: Past Surgical History: Procedure Laterality Date . CARDIAC CATHETERIZATION  04/2010  LIMA to LAD patent,SVG to OM patent,no in-stnet restenosis RCA . CATARACT EXTRACTION W/ INTRAOCULAR LENS  IMPLANT, BILATERAL Bilateral  2012 . CORONARY ANGIOPLASTY WITH STENT PLACEMENT  07/2009  bare metal stent to SVG to the RCA . CORONARY ARTERY BYPASS GRAFT  1996  LIMA to LAD,SVG to RCA & SVG to OM . INSERT / REPLACE / REMOVE PACEMAKER  2010 . MELANOMA EXCISION  05/1974 X2  "taken off my back" (12/15/2012) . NM MYOVIEW LTD  06/2011  low risk . PPM GENERATOR CHANGEOUT N/A 01/22/2017  Procedure: PPM GENERATOR CHANGEOUT;  Surgeon: [name], MD;  Location: [place] CV LAB;  Service: Cardiovascular;  Laterality: N/A; . TONSILLECTOMY  1938 . TRANSURETHRAL RESECTION OF PROSTATE  1986 . US ECHOCARDIOGRAPHY  07/11/2009  EF 45-37% HPI: 84 year old male admitted 08/25/17 with pain and weakness, UTI. PMH: CAD/CABG, CHF, depression/anxiety, bradycardia, sick sinus syndrome, pacer, CVA, GERD, gout, heart [place], [place] Spotted Fever, Melanoma, HTN, RLS, urinary retention, MI x2  Subjective: pt awake in bed, son in law and daughter Caryl Asp present Assessment / Plan / Recommendation CHL IP CLINICAL IMPRESSIONS 09/01/2017 Clinical Impression Pt with significantly improved swallow function compared to [facility] on Friday June 26th.  Mild dysphagia present without aspiration/penetration of any consistency tested (thin, nectar, pudding, cracker, barium tablet).  Mild weakness  continues with mild residuals with intermittent sensation.  Pt able to clear residuals with cued dry swallow.  In addition, pharyngeal swallow triggered at pyriform sinus with thin = therefore rec pt sit fully upright for all po~  Of note, pt throat clearing during MBS without barium in larynx, he does appear with secretions retained in pharynx.  Will follow up for dysphagia management and RMST to address dysphagia/cough strength.  Family present and pt, and family educated to findings/recommendations during [place]. SLP Visit Diagnosis Dysphagia, oropharyngeal phase (R13.12) Attention and concentration deficit following -- Frontal lobe and executive function deficit following -- Impact on safety and function Mild aspiration risk   CHL IP TREATMENT RECOMMENDATION 09/01/2017 Treatment Recommendations Therapy as outlined in treatment plan below   Prognosis 09/01/2017 Prognosis for Safe Diet Advancement Good Barriers to Reach Goals -- Barriers/Prognosis Comment -- CHL IP DIET RECOMMENDATION 09/01/2017 SLP Diet Recommendations Dysphagia 3 (Mech soft) solids;Thin liquid Liquid Administration via -- Medication Administration Whole meds with puree Compensations Slow rate;Small sips/bites;Follow solids with liquid Postural Changes Seated upright at 90 degrees;Remain semi-upright after after feeds/meals (Comment)   CHL IP OTHER RECOMMENDATIONS 09/01/2017 Recommended Consults -- Oral Care Recommendations Oral care BID Other Recommendations --   CHL IP FOLLOW UP RECOMMENDATIONS 09/01/2017 Follow up Recommendations Skilled Nursing facility   [facility] IP FREQUENCY AND DURATION 09/01/2017 Speech Therapy Frequency (ACUTE ONLY) min 1 x/week Treatment Duration 1 week      CHL IP ORAL PHASE 09/01/2017 Oral Phase WFL Oral - Pudding Teaspoon -- Oral - Pudding Cup -- Oral - Honey Teaspoon -- Oral - Honey Cup -- Oral - Nectar Teaspoon -- Oral - Nectar Cup WFL Oral - Nectar Straw -- Oral - Thin Teaspoon WFL Oral - Thin Cup WFL Oral - Thin Straw WFL Oral  - Puree WFL Oral - Mech Soft WFL Oral - Regular -- Oral - Multi-Consistency -- Oral - Pill Delayed oral transit Oral Phase - Comment --  CHL IP PHARYNGEAL PHASE 09/01/2017 Pharyngeal Phase -- Pharyngeal- Pudding Teaspoon -- Pharyngeal -- Pharyngeal- Pudding Cup -- Pharyngeal -- Pharyngeal- Honey Teaspoon -- Pharyngeal -- Pharyngeal- Honey Cup -- Pharyngeal -- Pharyngeal- Nectar Teaspoon WFL Pharyngeal -- Pharyngeal- Nectar Cup Reduced tongue base retraction Pharyngeal -- Pharyngeal- Nectar Straw -- Pharyngeal -- Pharyngeal- Thin Teaspoon Delayed swallow  initiation-pyriform sinuses;Reduced tongue base retraction Pharyngeal -- Pharyngeal- Thin Cup Delayed swallow initiation-pyriform sinuses;Reduced tongue base retraction Pharyngeal -- Pharyngeal- Thin Straw Delayed swallow initiation-pyriform sinuses;Reduced tongue base retraction Pharyngeal -- Pharyngeal- Puree Reduced tongue base retraction Pharyngeal -- Pharyngeal- Mechanical Soft WFL Pharyngeal -- Pharyngeal- Regular -- Pharyngeal -- Pharyngeal- Multi-consistency -- Pharyngeal -- Pharyngeal- Pill WFL Pharyngeal -- Pharyngeal Comment --  CHL IP CERVICAL ESOPHAGEAL PHASE 09/01/2017 Cervical Esophageal Phase WFL Pudding Teaspoon -- Pudding Cup -- Honey Teaspoon -- Honey Cup -- Nectar Teaspoon -- Nectar Cup -- Nectar Straw -- Thin Teaspoon -- Thin Cup -- Thin Straw -- Puree -- Mechanical Soft -- Regular -- Multi-consistency -- Pill -- Cervical Esophageal Comment -- Macario Golds 09/01/2017, 7:45 PM  Luanna Salk, MS [facility] SLP [phone]             Dg Swallowing Func-speech Pathology  Result Date: 08/29/2017 Objective Swallowing Evaluation: Type of Study: MBS-Modified Barium Swallow Study  Patient Details Name: Adam [name] MRN: [id number] Date of Birth: 13-Nov-1926 Today's Date: 08/29/2017 Time: SLP Start Time (ACUTE ONLY): 0850 -SLP Stop Time (ACUTE ONLY): 1015 SLP Time Calculation (min) (ACUTE ONLY): 85 min Past Medical History: Past Medical History: Diagnosis  Date . Arthritis   "minor, back and sometimes knees" (12/15/2012) . Bradycardia   AFib/SSS s/p St Jude PPM 04/12/2008 . CAD (coronary artery disease) 12/30/2014  CABG (LIMA-LAD, SVG-RCA, SVG-OM in 1996).  07/2009 BMS to SVG-RCA. Cath in 04/2010 with patent stents  . Cardiomyopathy, ischemic 08/25/2012 . CHF (congestive heart failure) ([place])  . Chronic knee pain 12/03/2014 . Combined congestive systolic and diastolic heart failure ([place]) 02/02/2015  Hx EF 41%. BNP 96.8 02/21/15 Torsemide 04/06/15 Na 142, K 4.6, Bun 16, creat 0.89 04/20/14 BNP 111.7, Na 142, K 4.6, Bun 16, creat 0.9  . Depression with anxiety 02/02/2015  02/21/15 Hgb A1c 5.8 03/10/15 MMSE 30/30  . Dizziness, after diuretic asscoiated with hypotension and responded to fluid bolus 06/05/2011  04/28/15 US carotid R+L normal bilateral arterial velocities.   Marland Kitchen Dyspnea 08/11/2014  Followed in Pulmonary clinic/ [facility]/ [place]  - 08/11/2014  Walked RA x 1 laps @ 185 ft each stopped due to fatigue/off balance/ slow pace/  no sob or desat  - PFT's  09/26/2014  FEV1 2.26 (85 % ) ratio 76  p no % improvement from saba with DLCO  67 % corrects to 93 % for alv volume      Since prev study 08/04/13 minimal change lung vol or dlco   . Embolic cerebral infarction ([place]) 12/06/2015 . Exertional shortness of breath   "sometimes walking" (12/15/2012) . GERD (gastroesophageal reflux disease)  . Gout 02/09/2015 . Heart murmur   "just told I had one today" (12/15/2012) . Hiatal hernia  . Hyperlipidemia  . Hypertension  . Hypothyroid  . Influenza A 03/10/2016 . Insomnia  . Melanoma of back ([place]) 1976 . Myocardial infarction [facility]) 1996; 2011  "both silent" (12/15/2012) . Nonrheumatic aortic valve stenosis  . Orthostatic hypotension  . Osteoporosis, senile  . Pacemaker  . RBBB  . Restless leg 02/02/2015 . Right leg weakness 12/06/2015 . [facility] spotted fever  . S/P CABG x 4  . Sick sinus syndrome ([place]) 01/31/2014 . Sustained ventricular tachycardia ([place]) 07/27/2014 . Urinary retention  12/30/2014 Past Surgical History: Past Surgical History: Procedure Laterality Date . CARDIAC CATHETERIZATION  04/2010  LIMA to LAD patent,SVG to OM patent,no in-stnet restenosis RCA . CATARACT EXTRACTION W/ INTRAOCULAR LENS  IMPLANT, BILATERAL Bilateral 2012 . CORONARY  ANGIOPLASTY WITH STENT PLACEMENT  07/2009  bare metal stent to SVG to the RCA . CORONARY ARTERY BYPASS GRAFT  1996  LIMA to LAD,SVG to RCA & SVG to OM . INSERT / REPLACE / REMOVE PACEMAKER  2010 . MELANOMA EXCISION  05/1974 X2  "taken off my back" (12/15/2012) . NM MYOVIEW LTD  06/2011  low risk . PPM GENERATOR CHANGEOUT N/A 01/22/2017  Procedure: PPM GENERATOR CHANGEOUT;  Surgeon: [name], MD;  Location: [facility];  Service: Cardiovascular;  Laterality: N/A; . TONSILLECTOMY  1938 . TRANSURETHRAL RESECTION OF PROSTATE  1986 . US ECHOCARDIOGRAPHY  07/11/2009  EF 45-16% HPI: 84 year old male admitted 08/25/17 with pain and weakness, UTI. PMH: CAD/CABG, CHF, depression/anxiety, bradycardia, sick sinus syndrome, pacer, CVA, GERD, gout, heart [place], [place] Spotted Fever, Melanoma, HTN, RLS, urinary retention, MI x2  Subjective: Pt and wife were seen in radiology for completion of MBS to objectively assess swallow function and safety. Pt was noted to clear his throat on a continuous basis prior to po trials. Significant pain reported - 8/10 at rest, 14/10 with swallow Assessment / Plan / Recommendation CHL IP CLINICAL IMPRESSIONS 08/29/2017 Clinical Impression Limited MBS completed, due to severity of pharyngeal dysphagia. Extensive education was provided before and after this study. Pt reports tolerance of regular diet and thin liquids prior to admit, without difficulty or odynophagia. At this time, pt reports generalized pain 8/10, with significant odynophagia (14/10). Prior to start of po presentations, pt was noted to exhibit a wet, thick voice quality and frequent throat clearing, raising concern for poor management of secretions. Suction  was set up; oral care was completed to minimize bacterial load given high risk of aspiration. Oral care effectively removed thick secretions from soft palate and dried secretions from the oral cavity. Small (1/2  to 1 tsp) boluses of nectar thick liquid were provided individually via spoon, with discoordinated oral control and manipulation noted, as well as premature spillage over the tongue base, delayed swallow reflex, penetration and aspiration after the swallow, and post-swallow residue. Reflexive cough was elicited in response to aspiration. A small (1/8 tsp) bolus of puree was then given. Following discoordinated oral prep and propulsion, the bolus was noted to spill to the pyriform sinus prior to triggering the swallow reflex. Aspiration of pyriform stasis was noted, with cough demonstrated. Study was terminated at this point given exceptionally high risk of aspiration. SLP reviewed MBS results and provided extensive education on the significance of pt's swallowing deficit, need for NPO status at this time, and possible need to consider nonoral feeding methods.  Recommend consideration of Palliative Care consult to facilitate establishment of appropriate goals of care. ST will follow acutely for education, trial of dysphagia therapy (which may be a challenge given severity of odynophagia).  SLP Visit Diagnosis Dysphagia, pharyngeal phase (R13.13) Impact on safety and function Risk for inadequate nutrition/hydration;Severe aspiration risk   CHL IP TREATMENT RECOMMENDATION 08/29/2017 Treatment Recommendations Therapy as outlined in treatment plan below   Prognosis 08/29/2017 Prognosis for Safe Diet Advancement Fair Barriers to Reach Goals Advanced age, multiple comorbidities CHL IP DIET RECOMMENDATION 08/29/2017 SLP Diet Recommendations NPO; Alternative means - temporary   Medication Administration Via alternative means   Postural Changes Recommend HOB remain at 45 degrees at all times, given poor secretion  management   CHL IP OTHER RECOMMENDATIONS 08/29/2017 Recommended Consults Palliative Care consult for [place] Oral Care Recommendations Oral care QID;Staff/trained caregiver to provide oral care Other Recommendations Have oral suction  available     CHL IP FREQUENCY AND DURATION 08/29/2017 Speech Therapy Frequency (ACUTE ONLY) min 2x/week Treatment Duration 2 weeks;1 week      CHL IP ORAL PHASE 08/29/2017 Oral Phase Slow bolus formation, discoordinated posterior propulsion  CHL IP PHARYNGEAL PHASE 08/29/2017 Pharyngeal Phase Impaired Pharyngeal- Nectar Teaspoon Delayed swallow initiation-pyriform sinuses;Delayed swallow initiation-vallecula;Reduced pharyngeal peristalsis;Reduced epiglottic inversion;Reduced anterior laryngeal mobility;Reduced laryngeal elevation;Reduced airway/laryngeal closure;Reduced tongue base retraction;Penetration/Apiration after swallow;Trace aspiration;Pharyngeal residue - valleculae;Pharyngeal residue - posterior pharnyx;Pharyngeal residue - pyriform Pharyngeal Material enters airway, passes BELOW cords and not ejected out despite cough attempt by patient Pharyngeal- Puree Delayed swallow initiation-vallecula;Delayed swallow initiation-pyriform sinuses;Reduced pharyngeal peristalsis;Reduced epiglottic inversion;Reduced airway/laryngeal closure;Reduced tongue base retraction;Reduced laryngeal elevation;Reduced anterior laryngeal mobility;Penetration/Apiration after swallow;Trace aspiration;Pharyngeal residue - pyriform;Pharyngeal residue - posterior pharnyx;Pharyngeal residue - valleculae Pharyngeal Material enters airway, passes BELOW cords and not ejected out despite cough attempt by patient  CHL IP CERVICAL ESOPHAGEAL PHASE 08/29/2017 Cervical Esophageal Phase Impaired Nectar Teaspoon Reduced cricopharyngeal relaxation Puree Reduced cricopharyngeal relaxation Celia B. Quentin Ore [facility], CCC-SLP Speech Language Pathologist [phone] Shonna Chock 08/29/2017, 10:49 AM                Assessment/Plan  1. Left arm swelling New finding, had iv line placed in this arm in hospital, with asymmetric swelling and his limited mobility, will obtain venous doppler to rule out DVT. To keep left arm propped up to help subside dependent edema.  2. Spondylosis of cervical region without myelopathy or radiculopathy Continue tylenol but change from 1000 mg tid to 500 mg tid. Start norco 5-325 mg 1 tab BID. Changes made to tylenol dosing to not exceed 3 g in 24 hours. Continue norco 5-325 mg 1-2 tab q4h prn pain for now. Continue robaxin 250 mg bid. pending follow up with neurosurgery on 8/27 and orthopedic on 8/26.   3. Tear of left rotator cuff, unspecified tear extent, unspecified whether traumatic Noted on xray. This limits his ROM with left shoulder. Pain regimen changes as above   Family/ staff Communication: reviewed care plan with patient and charge nurse. Left a message for the daughter explaining the care plan   Labs/tests ordered:  Venous doppler for LUE  Blanchie Serve, MD Internal Medicine [place], [place] [address] Cell Phone (Monday-Friday 8 am - 5 pm): [phone] On Call: [phone] and follow prompts after 5 pm and on weekends Office Phone: [phone] Office Fax: [phone]

## 2017-09-29 ENCOUNTER — Encounter: Payer: Self-pay | Admitting: Cardiovascular Disease

## 2017-10-03 ENCOUNTER — Non-Acute Institutional Stay (SKILLED_NURSING_FACILITY): Payer: Medicare Other | Admitting: Family

## 2017-10-03 ENCOUNTER — Encounter: Payer: Self-pay | Admitting: Family

## 2017-10-03 DIAGNOSIS — M7989 Other specified soft tissue disorders: Secondary | ICD-10-CM | POA: Diagnosis not present

## 2017-10-03 DIAGNOSIS — N5082 Scrotal pain: Secondary | ICD-10-CM

## 2017-10-03 DIAGNOSIS — S0511XA Contusion of eyeball and orbital tissues, right eye, initial encounter: Secondary | ICD-10-CM | POA: Diagnosis not present

## 2017-10-03 NOTE — Progress Notes (Signed)
Location:  [facility] Room Number: 29 Place of Service:  SNF (31) Provider: Charmeka [place] FNP-C  Blanchie Serve, MD  Patient Care Team: Blanchie Serve, MD as PCP - General (Internal Medicine) Croitoru, Dani Gobble, MD as Attending Physician (Cardiology) Vevelyn Royals, MD as Consulting Physician (Ophthalmology) Franchot Gallo, MD as Consulting Physician (Urology) Tanda Rockers, MD as Consulting Physician (Pulmonary Disease) Allyn Kenner, MD (Dermatology) Deliah Goody, PA-C as Physician Assistant (Physician Assistant) Jalyn Rosero, Nelda Bucks, NP as Nurse Practitioner (Family Medicine)  Extended Emergency Contact Information Primary Emergency Contact: Sylve,[name] Address: [address], [place] Phone: [phone] Mobile Phone: [phone] Relation: Spouse Secondary Emergency Contact: [name],Manny  [place] Mobile Phone: [phone] Relation: Daughter  Code Status:  Full code  Goals of care: Advanced Directive information Advanced Directives 10/03/2017  Does Patient Have a Medical Advance Directive? Yes  Type of Paramedic of [place];Living will  Does patient want to make changes to medical advance directive? -  Copy of [place] in Chart? Yes  Would patient like information on creating a medical advance directive? -  Pre-existing out of facility DNR order (yellow form or pink MOST form) -     Chief Complaint  Patient presents with  . Acute Visit    check eye-- red and has some drainage    HPI:  Pt is a 84 y.o. male seen today at [facility] for an acute visit for evaluation of right eyebrow redness.He is seen in his room today with facility Nurse present at bedside.Facility Nurse reported via SBAR that patient right eye was red and had some drainage.Patient states didn't know that his eye was red until the wife and Nurse told him.he denies  any pain or tenderness to area.Of note he wears eye glasses but doubt whether the eyeglasses caused the redness. He also complains of pain in his scrotum states he has not told anyone about the pain.He is unable to recall how long he has had the pain.He denies any trauma,fever,chills or abdominal pain.He has an indwelling foley catheter.    Past Medical History:  Diagnosis Date  . Arthritis    "minor, back and sometimes knees" (12/15/2012)  . Bradycardia    AFib/SSS s/p St Jude PPM 04/12/2008  . CAD (coronary artery disease) 12/30/2014   CABG (LIMA-LAD, SVG-RCA, SVG-OM in 1996).  07/2009 BMS to SVG-RCA. Cath in 04/2010 with patent stents   . Cardiomyopathy, ischemic 08/25/2012  . CHF (congestive heart failure) ([place])   . Chronic knee pain 12/03/2014  . Combined congestive systolic and diastolic heart failure ([place]) 02/02/2015   Hx EF 41%. BNP 96.8 02/21/15 Torsemide 04/06/15 Na 142, K 4.6, Bun 16, creat 0.89 04/20/14 BNP 111.7, Na 142, K 4.6, Bun 16, creat 0.9   . Depression with anxiety 02/02/2015   02/21/15 Hgb A1c 5.8 03/10/15 MMSE 30/30   . Dizziness, after diuretic asscoiated with hypotension and responded to fluid bolus 06/05/2011   04/28/15 US carotid R+L normal bilateral arterial velocities.    Marland Kitchen Dyspnea 08/11/2014   Followed in Pulmonary clinic/ [facility]/ [place]  - 08/11/2014  Walked RA x 1 laps @ 185 ft each stopped due to fatigue/off balance/ slow pace/  no sob or desat  - PFT's  09/26/2014  FEV1 2.26 (85 % ) ratio 76  p no % improvement from saba with DLCO  67 % corrects  to 93 % for alv volume      Since prev study 08/04/13 minimal change lung vol or dlco    . Embolic cerebral infarction ([place]) 12/06/2015  . Exertional shortness of breath    "sometimes walking" (12/15/2012)  . GERD (gastroesophageal reflux disease)   . Gout 02/09/2015  . Heart murmur    "just told I had one today" (12/15/2012)  . Hiatal hernia   . Hyperlipidemia   . Hypertension   . Hypothyroid   . Influenza A 03/10/2016    . Insomnia   . Melanoma of back ([place]) 1976  . Myocardial infarction [facility]) 1996; 2011   "both silent" (12/15/2012)  . Nonrheumatic aortic valve stenosis   . Orthostatic hypotension   . Osteoporosis, senile   . Pacemaker   . RBBB   . Restless leg 02/02/2015  . Right leg weakness 12/06/2015  . [facility] spotted fever   . S/P CABG x 4   . Sick sinus syndrome ([place]) 01/31/2014  . Sustained ventricular tachycardia ([place]) 07/27/2014  . Urinary retention 12/30/2014   Past Surgical History:  Procedure Laterality Date  . CARDIAC CATHETERIZATION  04/2010   LIMA to LAD patent,SVG to OM patent,no in-stnet restenosis RCA  . CATARACT EXTRACTION W/ INTRAOCULAR LENS  IMPLANT, BILATERAL Bilateral 2012  . CORONARY ANGIOPLASTY WITH STENT PLACEMENT  07/2009   bare metal stent to SVG to the RCA  . CORONARY ARTERY BYPASS GRAFT  1996   LIMA to LAD,SVG to RCA & SVG to OM  . INSERT / REPLACE / REMOVE PACEMAKER  2010  . MELANOMA EXCISION  05/1974 X2   "taken off my back" (12/15/2012)  . NM MYOVIEW LTD  06/2011   low risk  . PPM GENERATOR CHANGEOUT N/A 01/22/2017   Procedure: PPM GENERATOR CHANGEOUT;  Surgeon: [name], MD;  Location: [facility];  Service: Cardiovascular;  Laterality: N/A;  . TONSILLECTOMY  1938  . TRANSURETHRAL RESECTION OF PROSTATE  1986  . US ECHOCARDIOGRAPHY  07/11/2009   EF 45-50%    Allergies  Allergen Reactions  . Altace [Ramipril] Cough  . Crestor [Rosuvastatin Calcium] Rash  . Penicillins Rash and Other (See Comments)    Has patient had a PCN reaction causing immediate rash, facial/tongue/throat swelling, SOB or lightheadedness with hypotension: Yes Has patient had a PCN reaction causing severe rash involving mucus membranes or skin necrosis: No Has patient had a PCN reaction that required hospitalization No Has patient had a PCN reaction occurring within the last 10 years: No If all of the above answers are "NO", then may proceed with Cephalosporin use.    . Sulfa Antibiotics Rash    Outpatient Encounter Medications as of 10/03/2017  Medication Sig  . acetaminophen (TYLENOL) 500 MG tablet Take 1,000 mg by mouth 3 (three) times daily.  . Amino Acids-Protein Hydrolys (FEEDING SUPPLEMENT, PRO-STAT SUGAR FREE 64,) LIQD Take 30 mLs by mouth daily.  Marland Kitchen amiodarone (PACERONE) 100 MG tablet Take 1 tablet (100 mg total) by mouth daily.  Marland Kitchen aspirin EC 81 MG tablet Take 1 tablet (81 mg total) by mouth daily.  . Cholecalciferol (VITAMIN D3) 5000 UNITS CAPS Take 5,000 Units by mouth every Monday.   . clopidogrel (PLAVIX) 75 MG tablet TAKE 1 TABLET ONCE DAILY.  Marland Kitchen Droxidopa 100 MG CAPS Take 100 mg by mouth 3 (three) times daily.  Marland Kitchen guaiFENesin (ROBITUSSIN) 100 MG/5ML SOLN Take 5 mLs (100 mg total) by mouth every 4 (four) hours as needed for cough or to  loosen phlegm.  Marland Kitchen HYDROcodone-acetaminophen (NORCO/VICODIN) 5-325 MG tablet Take 1 tablet by mouth every 4 (four) hours as needed for severe pain.  Marland Kitchen HYDROcodone-acetaminophen (NORCO/VICODIN) 5-325 MG tablet Take 2 tablets by mouth every 4 (four) hours as needed for severe pain.  Marland Kitchen levothyroxine (SYNTHROID, LEVOTHROID) 100 MCG tablet Take 100 mcg by mouth daily before breakfast.  . Lidocaine 4 % PTCH Apply 1 patch topically daily. Left shoulder  . meclizine (ANTIVERT) 25 MG tablet Take 1 tablet (25 mg total) by mouth 2 (two) times daily as needed for dizziness.  . Melatonin 3 MG TABS Take 3 mg by mouth at bedtime.  . methocarbamol (ROBAXIN) 500 MG tablet Take 250 mg by mouth 2 (two) times daily.   . midodrine (PROAMATINE) 10 MG tablet TAKE 1 TABLET THREE TIMES DAILY. TAKE LAST DOSE AT LEAST 6 HOURS BEFORE BEDTIME.  . pantoprazole (PROTONIX) 40 MG tablet TAKE 1 TABLET BY MOUTH TWICE DAILY.  Marland Kitchen polyethylene glycol (MIRALAX / GLYCOLAX) packet Take 17 g by mouth daily. Hold for loose stool  . pravastatin (PRAVACHOL) 40 MG tablet Take 1 tablet (40 mg total) by mouth at bedtime.  Marland Kitchen rOPINIRole (REQUIP) 0.5 MG tablet TAKE  1 TABLET 3 TIMES A DAY AS NEEDED.  Marland Kitchen sennosides-docusate sodium (SENOKOT-S) 8.6-50 MG tablet Take 2 tablets by mouth at bedtime.   . torsemide (DEMADEX) 10 MG tablet Take 5 mg by mouth daily.   . traZODone (DESYREL) 50 MG tablet Take 1 tablet (50 mg total) by mouth at bedtime.   No facility-administered encounter medications on file as of 10/03/2017.     Review of Systems  Constitutional: Negative for appetite change, chills, fatigue and fever.  HENT: Negative for congestion, rhinorrhea, sinus pressure, sinus pain, sneezing and sore throat.   Eyes: Positive for visual disturbance. Negative for pain, discharge, redness and itching.       Wears eye glasses.Right eyebrow/paranasal redness   Respiratory: Negative for cough, chest tightness, shortness of breath and wheezing.   Cardiovascular: Negative for chest pain, palpitations and leg swelling.  Gastrointestinal: Negative for abdominal distention, abdominal pain, constipation, diarrhea, nausea and vomiting.  Genitourinary: Negative for flank pain and urgency.       Indwelling foley catheter   Skin: Negative for color change, pallor, rash and wound.  Neurological: Negative for dizziness, light-headedness and headaches.  Hematological: Bruises/bleeds easily.  Psychiatric/Behavioral: Negative for agitation and sleep disturbance. The patient is not nervous/anxious.     Immunization History  Administered Date(s) Administered  . Influenza, High Dose Seasonal PF 11/01/2014, 11/13/2016  . Influenza-Unspecified 12/05/2013, 11/05/2014, 11/16/2015  . PPD Test 03/07/2014  . Pneumococcal Conjugate-13 10/05/2009  . Pneumococcal Polysaccharide-23 11/05/2005  . Pneumococcal-Unspecified 10/05/2009  . Tdap 08/02/2014  . Zoster 11/05/2005   Pertinent  Health Maintenance Due  Topic Date Due  . PNA vac Low Risk Adult (2 of 2 - PCV13) 10/06/2010  . INFLUENZA VACCINE  09/04/2017  . DEXA SCAN  12/14/2024   Fall Risk  10/02/2016 10/30/2015 09/05/2015  02/02/2015  Falls in the past year? No No No No  Risk for fall due to : - Impaired balance/gait - -    Vitals:   10/03/17 1013  BP: 136/83  Pulse: 70  Resp: 18  Temp: 98.2 F (36.8 C)  SpO2: 95%  Weight: 171 lb (77.6 kg)  Height: 5\' 10"  (1.778 m)   Body mass index is 24.54 kg/m. Physical Exam  Constitutional: He appears well-developed and well-nourished.  Elderly in no acute distress  HENT:  Head: Normocephalic.  Mouth/Throat: Oropharynx is clear and moist. No oropharyngeal exudate.  Eyes: Pupils are equal, round, and reactive to light. Conjunctivae and EOM are normal. Right eye exhibits no discharge. Left eye exhibits no discharge. No scleral icterus.  Right inner eyebrow and nasal ridge purple bruise none tender to touch and no drainage noted.surrounding skin tissue without any signs of infections.   Neck: Normal range of motion.  Cardiovascular: Exam reveals no gallop and no friction rub.  Murmur heard. Pulmonary/Chest: Effort normal and breath sounds normal. No respiratory distress. He has no wheezes. He exhibits no tenderness.  Abdominal: Soft. Bowel sounds are normal. He exhibits no distension and no mass. There is no tenderness. There is no rebound and no guarding. A hernia is present. Hernia confirmed positive in the left inguinal area. Hernia confirmed negative in the right inguinal area.  Genitourinary: Penis normal. Right testis shows swelling and tenderness. Right testis shows no mass. Cremasteric reflex is not absent on the right side. Left testis shows swelling and tenderness. Left testis shows no mass. Cremasteric reflex is not absent on the left side. No penile erythema or penile tenderness. No discharge found.  Genitourinary Comments: Indwelling foley catheter draining adequate amount of clear yellow urine.  Musculoskeletal: He exhibits no tenderness.  Moves x 4 extremities.left forearm chronic edema.   Lymphadenopathy:    He has no cervical adenopathy. No  inguinal adenopathy noted on the right or left side.  Neurological: Gait normal.  Forgetful at times.   Skin: Skin is warm and dry. No rash noted. No erythema. No pallor.  Psychiatric: He has a normal mood and affect. His speech is normal and behavior is normal. Judgment and thought content normal.  Nursing note and vitals reviewed.   Labs reviewed: Recent Labs    08/26/17 0426 08/27/17 0423 08/29/17 1022 08/31/17 0342 09/04/17 1054 09/08/17 09/11/17 09/12/17  NA 139 142 140 139 140 137 136 136*  K 4.2 3.8 3.8 3.7 3.4* 4.4 4.2 4.2  CL 107 108 106 106 105  --   --   --   CO2 25 26 25 25 26   --   --   --   GLUCOSE 104* 109* 113* 120* 94  --   --   --   BUN 13 18 15 17 15 14 17   --   CREATININE 0.49* 0.57* 0.55* 0.51* 0.51* 0.61 0.59 0.6  CALCIUM 8.6* 8.5* 8.8* 8.3* 8.0* 8.6 8.2  --   MG 2.2 2.1  --   --   --   --   --   --   PHOS 3.4  --   --   --   --   --   --   --    Recent Labs    08/27/17 0423 09/04/17 1054 09/11/17 09/12/17  AST 22 33 20 20  ALT 17 45* 25 25  ALKPHOS 59 61 76 76  BILITOT 0.6 0.3 0.3  --   PROT 6.2* 5.8* 5.8  --   ALBUMIN 2.5* 2.4* 3.0  --    Recent Labs    04/07/17 2358  08/27/17 0423  08/31/17 0342 09/04/17 1054 09/08/17 09/11/17 09/12/17  WBC 8.6   < > 8.9   < > 8.1 12.2* 9.5 7.6 7.6  NEUTROABS 6.2  --  6.5  --   --  8.9*  --   --   --   HGB 13.7   < > 12.1*   < > 12.4*  12.8* 12.9 12.8 12.8*  HCT 41.1   < > 36.9*   < > 38.4* 39.2 38.9 38.8 39*  MCV 97.4   < > 97.1   < > 97.0 97.3 93.5 94.6  --   PLT 230   < > 243   < > 339 354  --   --  300   < > = values in this interval not displayed.   Lab Results  Component Value Date   TSH 1.125 08/26/2017   Lab Results  Component Value Date   HGBA1C 5.6 12/07/2015   Lab Results  Component Value Date   CHOL 103 05/05/2017   HDL 39 (L) 05/05/2017   LDLCALC 43 05/05/2017   TRIG 129 05/05/2017   CHOLHDL 2.6 05/05/2017    Significant Diagnostic Results in last 30 days:  No results  found.  Assessment/Plan 1. Scrotum pain Afebrile.skin redness, swelling and tender to touch.No palpable cord noted.will obtain CBC/diff rule out infectious etiologies.   2. Bruise of eye, right, initial encounter Unclear etiology.Medication reviewed  Patient currently on ASA 81 mg tablet and Plavix 75 mg tablet daily.Bruise non-tender to touch no drainage.conjuctiva and inner eye without any redness.CBC/diff as above.   3. Left arm swelling  Had I.V on this hand during recent hospital admission.Venous doppler negative for DVT.Moves arm without difficulties.continue to prop arm up to subside dependent edema.Obtain CMP and prealbumin to rule metabolic etiology.   4. Forgetfulness  Forgetful at times.obtain MMSE to rule out cognitive impairment.   Family/ staff Communication: Reviewed plan of care with patient and facility Nurse  Labs/tests ordered: CBC/diff and CMP   Jaylea Plourde C Mickayla Trouten, NP

## 2017-10-09 ENCOUNTER — Non-Acute Institutional Stay (SKILLED_NURSING_FACILITY): Payer: Medicare Other

## 2017-10-09 DIAGNOSIS — Z0000 Encounter for general adult medical examination without abnormal findings: Secondary | ICD-10-CM

## 2017-10-09 LAB — CBC
HCT: 37.9
HGB: 12.8
MCV: 92.7
WBC: 7.4
platelet count: 253

## 2017-10-09 LAB — COMPLETE METABOLIC PANEL WITH GFR
ALT: 8
AST: 13
Albumin: 3.6
Alkaline Phosphatase: 79
BUN: 16 (ref 4–21)
Calcium: 8.7
Creat: 0.64
EGFR (Non-African Amer.): 87
Glucose: 86
Potassium: 4.2
Sodium: 139
Total Bilirubin: 0.4
Total Protein: 6.3 g/dL

## 2017-10-09 NOTE — Progress Notes (Signed)
Subjective:   ROC STREETT is a 84 y.o. male who presents for Medicare Annual/Subsequent preventive examination at [facility]  Last AWV-10/02/2016    Objective:    Vitals: BP 128/80 (BP Location: Left Arm, Patient Position: Supine)   Pulse 72   Temp 98.2 F (36.8 C) (Oral)   Ht 5\' 10"  (1.778 m)   Wt 171 lb (77.6 kg)   BMI 24.54 kg/m   Body mass index is 24.54 kg/m.  Advanced Directives 10/09/2017 10/03/2017 09/25/2017 09/18/2017 09/12/2017 09/08/2017 08/25/2017  Does Patient Have a Medical Advance Directive? Yes Yes Yes Yes Yes Yes Yes  Type of Paramedic of [place];Living will [place];Living will [place];Living will [place];Living will [place];Living will [place];Living will [place];Living will  Does patient want to make changes to medical advance directive? No - Patient declined - No - Patient declined No - Patient declined - No - Patient declined No - Patient declined  Copy of [place] in Chart? Yes Yes Yes Yes Yes Yes No - copy requested  Would patient like information on creating a medical advance directive? - - - - - - -  Pre-existing out of facility DNR order (yellow form or pink MOST form) - - - - - - -    Tobacco Social History   Tobacco Use  Smoking Status Never Smoker  Smokeless Tobacco Never Used     Counseling given: Not Answered   Clinical Intake:  Pre-visit preparation completed: No  Pain : No/denies pain     Nutritional Risks: None Diabetes: No  How often do you need to have someone help you when you read instructions, pamphlets, or other written materials from your doctor or pharmacy?: 2 - Rarely What is the last grade level you completed in school?: college  Interpreter Needed?: No  Information entered by :: Tyson Dense, RN  Past Medical History:  Diagnosis  Date  . Arthritis    "minor, back and sometimes knees" (12/15/2012)  . Bradycardia    AFib/SSS s/p St Jude PPM 04/12/2008  . CAD (coronary artery disease) 12/30/2014   CABG (LIMA-LAD, SVG-RCA, SVG-OM in 1996).  07/2009 BMS to SVG-RCA. Cath in 04/2010 with patent stents   . Cardiomyopathy, ischemic 08/25/2012  . CHF (congestive heart failure) ([place])   . Chronic knee pain 12/03/2014  . Combined congestive systolic and diastolic heart failure ([place]) 02/02/2015   Hx EF 41%. BNP 96.8 02/21/15 Torsemide 04/06/15 Na 142, K 4.6, Bun 16, creat 0.89 04/20/14 BNP 111.7, Na 142, K 4.6, Bun 16, creat 0.9   . Depression with anxiety 02/02/2015   02/21/15 Hgb A1c 5.8 03/10/15 MMSE 30/30   . Dizziness, after diuretic asscoiated with hypotension and responded to fluid bolus 06/05/2011   04/28/15 US carotid R+L normal bilateral arterial velocities.    Marland Kitchen Dyspnea 08/11/2014   Followed in Pulmonary clinic/ [facility]/ [place]  - 08/11/2014  Walked RA x 1 laps @ 185 ft each stopped due to fatigue/off balance/ slow pace/  no sob or desat  - PFT's  09/26/2014  FEV1 2.26 (85 % ) ratio 76  p no % improvement from saba with DLCO  67 % corrects to 93 % for alv volume      Since prev study 08/04/13 minimal change lung vol or dlco    . Embolic cerebral infarction ([place]) 12/06/2015  . Exertional  shortness of breath    "sometimes walking" (12/15/2012)  . GERD (gastroesophageal reflux disease)   . Gout 02/09/2015  . Heart murmur    "just told I had one today" (12/15/2012)  . Hiatal hernia   . Hyperlipidemia   . Hypertension   . Hypothyroid   . Influenza A 03/10/2016  . Insomnia   . Melanoma of back ([place]) 1976  . Myocardial infarction [facility] 1996; 2011   "both silent" (12/15/2012)  . Nonrheumatic aortic valve stenosis   . Orthostatic hypotension   . Osteoporosis, senile   . Pacemaker   . RBBB   . Restless leg 02/02/2015  . Right leg weakness 12/06/2015  . [facility] spotted fever   . S/P CABG x 4   . Sick sinus syndrome ([place])  01/31/2014  . Sustained ventricular tachycardia ([place]) 07/27/2014  . Urinary retention 12/30/2014   Past Surgical History:  Procedure Laterality Date  . CARDIAC CATHETERIZATION  04/2010   LIMA to LAD patent,SVG to OM patent,no in-stnet restenosis RCA  . CATARACT EXTRACTION W/ INTRAOCULAR LENS  IMPLANT, BILATERAL Bilateral 2012  . CORONARY ANGIOPLASTY WITH STENT PLACEMENT  07/2009   bare metal stent to SVG to the RCA  . CORONARY ARTERY BYPASS GRAFT  1996   LIMA to LAD,SVG to RCA & SVG to OM  . INSERT / REPLACE / REMOVE PACEMAKER  2010  . MELANOMA EXCISION  05/1974 X2   "taken off my back" (12/15/2012)  . NM MYOVIEW LTD  06/2011   low risk  . PPM GENERATOR CHANGEOUT N/A 01/22/2017   Procedure: PPM GENERATOR CHANGEOUT;  Surgeon: [name], MD;  Location: [facility];  Service: Cardiovascular;  Laterality: N/A;  . TONSILLECTOMY  1938  . TRANSURETHRAL RESECTION OF PROSTATE  1986  . US ECHOCARDIOGRAPHY  07/11/2009   EF 45-50%   Family History  Problem Relation Age of Onset  . Coronary artery disease Mother   . Diabetes Mother   . Heart disease Mother   . Coronary artery disease Father   . Diabetes Father   . Lung cancer Father   . Heart disease Father   . Anuerysm Son   . Heart disease Brother    Social History   Socioeconomic History  . Marital status: Married    Spouse name: Not on file  . Number of children: 2  . Years of education: Masters  . Highest education level: Not on file  Occupational History  . Occupation: Retired Company secretary -Pensions consultant  Social Needs  . Financial resource strain: Not hard at all  . Food insecurity:    Worry: Never true    Inability: Never true  . Transportation needs:    Medical: No    Non-medical: No  Tobacco Use  . Smoking status: Never Smoker  . Smokeless tobacco: Never Used  Substance and Sexual Activity  . Alcohol use: No    Alcohol/week: 0.0 standard drinks  . Drug use: No  . Sexual activity: Never  Lifestyle    . Physical activity:    Days per week: 7 days    Minutes per session: 10 min  . Stress: Not at all  Relationships  . Social connections:    Talks on phone: More than three times a week    Gets together: More than three times a week    Attends religious service: Never    Active member of club or organization: No    Attends meetings of clubs or organizations: Never  Relationship status: Married  Other Topics Concern  . Not on file  Social History Narrative   Lives at [place] [facility] [place] [address]   Married - Violet   Never smoked   Alcohol none   Previously employed as Scientist, forensic for [employer].         Diet:Low sodium   Do you drink/eat things with caffeine? No   Marital status: Married                              What year were you married?1950   Do you live in a house, apartment, assisted living, condo, trailer, etc)?    Is it one or more stories? 1   How many persons live in your home? 2   Do you have any pets in your home? No   Current or past profession: Minister, [employer]iore[employer]   Do you exercise?     Very Little                                                 Type & how often:    Do you have a living will?  Yes   Do you have a DNR Form? Yes   Do you have a POA/HPOA forms? Yes    Outpatient Encounter Medications as of 10/09/2017  Medication Sig  . acetaminophen (TYLENOL) 500 MG tablet Take 1,000 mg by mouth 3 (three) times daily.  . Amino Acids-Protein Hydrolys (FEEDING SUPPLEMENT, PRO-STAT SUGAR FREE 64,) LIQD Take 30 mLs by mouth daily.  Marland Kitchen amiodarone (PACERONE) 100 MG tablet Take 1 tablet (100 mg total) by mouth daily.  Marland Kitchen aspirin EC 81 MG tablet Take 1 tablet (81 mg total) by mouth daily.  . Cholecalciferol (VITAMIN D3) 5000 UNITS CAPS Take 5,000 Units by mouth every Monday.   . clopidogrel (PLAVIX) 75 MG tablet TAKE 1 TABLET ONCE DAILY.  Marland Kitchen Droxidopa 100 MG CAPS Take 100 mg by mouth 3 (three) times daily.  Marland Kitchen guaiFENesin (ROBITUSSIN) 100  MG/5ML SOLN Take 5 mLs (100 mg total) by mouth every 4 (four) hours as needed for cough or to loosen phlegm.  Marland Kitchen HYDROcodone-acetaminophen (NORCO/VICODIN) 5-325 MG tablet Take 1 tablet by mouth every 4 (four) hours as needed for severe pain.  Marland Kitchen HYDROcodone-acetaminophen (NORCO/VICODIN) 5-325 MG tablet Take 2 tablets by mouth every 4 (four) hours as needed for severe pain.  Marland Kitchen levothyroxine (SYNTHROID, LEVOTHROID) 100 MCG tablet Take 100 mcg by mouth daily before breakfast.  . Lidocaine 4 % PTCH Apply 1 patch topically daily. Left shoulder  . meclizine (ANTIVERT) 25 MG tablet Take 1 tablet (25 mg total) by mouth 2 (two) times daily as needed for dizziness.  . Melatonin 3 MG TABS Take 3 mg by mouth at bedtime.  . methocarbamol (ROBAXIN) 500 MG tablet Take 250 mg by mouth 2 (two) times daily.   . midodrine (PROAMATINE) 10 MG tablet TAKE 1 TABLET THREE TIMES DAILY. TAKE LAST DOSE AT LEAST 6 HOURS BEFORE BEDTIME.  . pantoprazole (PROTONIX) 40 MG tablet TAKE 1 TABLET BY MOUTH TWICE DAILY.  Marland Kitchen polyethylene glycol (MIRALAX / GLYCOLAX) packet Take 17 g by mouth daily. Hold for loose stool  . pravastatin (PRAVACHOL) 40 MG tablet Take 1 tablet (40 mg total) by mouth at bedtime.  Marland Kitchen rOPINIRole (  REQUIP) 0.5 MG tablet TAKE 1 TABLET 3 TIMES A DAY AS NEEDED.  Marland Kitchen sennosides-docusate sodium (SENOKOT-S) 8.6-50 MG tablet Take 2 tablets by mouth at bedtime.   . torsemide (DEMADEX) 10 MG tablet Take 5 mg by mouth daily.   . traZODone (DESYREL) 50 MG tablet Take 1 tablet (50 mg total) by mouth at bedtime.   No facility-administered encounter medications on file as of 10/09/2017.     Activities of Daily Living In your present state of health, do you have any difficulty performing the following activities: 10/09/2017 08/25/2017  Hearing? N -  Vision? N -  Difficulty concentrating or making decisions? N -  Walking or climbing stairs? Y -  Dressing or bathing? Y -  Doing errands, shopping? Tempie Donning  Preparing Food and eating ? Y  -  Using the Toilet? Y -  In the past six months, have you accidently leaked urine? Y -  Do you have problems with loss of bowel control? Y -  Managing your Medications? Y -  Managing your Finances? Y -  Housekeeping or managing your Housekeeping? Y -  Some recent data might be hidden    Patient Care Team: Blanchie Serve, MD as PCP - General (Internal Medicine) Croitoru, Dani Gobble, MD as Attending Physician (Cardiology) Vevelyn Royals, MD as Consulting Physician (Ophthalmology) Franchot Gallo, MD as Consulting Physician (Urology) Tanda Rockers, MD as Consulting Physician (Pulmonary Disease) Allyn Kenner, MD (Dermatology) Deliah Goody, PA-C as Physician Assistant (Physician Assistant) Ngetich, Nelda Bucks, NP as Nurse Practitioner (Family Medicine)   Assessment:   This is a routine wellness examination for [place].  Exercise Activities and Dietary recommendations Current Exercise Habits: Home exercise routine, Type of exercise: walking, Time (Minutes): 10, Frequency (Times/Week): 7, Weekly Exercise (Minutes/Week): 70, Exercise limited by: orthopedic condition(s)  Goals    . Increase water intake     Patient will increase water intake       Fall Risk Fall Risk  10/09/2017 10/02/2016 10/30/2015 09/05/2015 02/02/2015  Falls in the past year? No No No No No  Risk for fall due to : - - Impaired balance/gait - -   Is the patient's home free of loose throw rugs in walkways, pet beds, electrical cords, etc?   yes      Grab bars in the bathroom? yes      Handrails on the stairs?   yes      Adequate lighting?   yes  Depression Screen PHQ 2/9 Scores 10/09/2017 10/02/2016 09/05/2015 02/02/2015  PHQ - 2 Score 0 0 0 0    Cognitive Function MMSE - Mini Mental State Exam 10/09/2017 10/02/2016  Orientation to time 4 3  Orientation to Place 5 5  Registration 3 3  Attention/ Calculation 0 5  Recall 1 2  Language- name 2 objects 2 2  Language- repeat 1 1  Language- follow 3 step command 3 3    Language- read & follow direction 1 1  Write a sentence 1 1  Copy design 0 0  Total score 21 26        Immunization History  Administered Date(s) Administered  . Influenza, High Dose Seasonal PF 11/01/2014, 11/13/2016  . Influenza-Unspecified 12/05/2013, 11/05/2014, 11/16/2015  . PPD Test 03/07/2014  . Pneumococcal Conjugate-13 10/05/2009  . Pneumococcal Polysaccharide-23 11/05/2005  . Pneumococcal-Unspecified 10/05/2009  . Tdap 08/02/2014  . Zoster 11/05/2005    Qualifies for Shingles Vaccine? Not in past records  Screening Tests Health Maintenance  Topic Date Due  .  PNA vac Low Risk Adult (2 of 2 - PCV13) 10/06/2010  . INFLUENZA VACCINE  09/04/2017  . TETANUS/TDAP  08/01/2026 (Originally 08/01/2024)  . DEXA SCAN  12/14/2024   Cancer Screenings: Lung: Low Dose CT Chest recommended if Age 78-80 years, 30 pack-year currently smoking OR have quit w/in 15years. Patient does not qualify. Colorectal: up to date  Additional Screenings:  Hepatitis C Screening:declined Flu vaccine due: will receive at [place]:    I have personally reviewed and addressed the Medicare Annual Wellness questionnaire and have noted the following in the patient's chart:  A. Medical and social history B. Use of alcohol, tobacco or illicit drugs  C. Current medications and supplements D. Functional ability and status E.  Nutritional status F.  Physical activity G. Advance directives H. List of other physicians I.  Hospitalizations, surgeries, and ER visits in previous 12 months J.  [place] to include hearing, vision, cognitive, depression L. Referrals and appointments - none  In addition, I have reviewed and discussed with patient certain preventive protocols, quality metrics, and best practice recommendations. A written personalized care plan for preventive services as well as general preventive health recommendations were provided to patient.  See attached scanned  questionnaire for additional information.   Signed,   Tyson Dense, RN Nurse Health Advisor  Patient concerns: None

## 2017-10-09 NOTE — Patient Instructions (Addendum)
Adam [name] , Thank you for taking time to come for your Medicare Wellness Visit. I appreciate your ongoing commitment to your health goals. Please review the following plan we discussed and let me know if I can assist you in the future.   Screening recommendations/referrals: Colonoscopy excluded, over age 81 Recommended yearly ophthalmology/optometry visit for glaucoma screening and checkup Recommended yearly dental visit for hygiene and checkup  Vaccinations: Influenza vaccine due, will receive at [facility] Pneumococcal vaccine up to date, completed Tdap vaccine up to date Shingles vaccine not in past records    Advanced directives: in chart  Conditions/risks identified: none  Next appointment: Dr. Bubba Camp makes rounds  Preventive Care 3 Years and Older, Male Preventive care refers to lifestyle choices and visits with your health care provider that can promote health and wellness. What does preventive care include?  A yearly physical exam. This is also called an annual well check.  Dental exams once or twice a year.  Routine eye exams. Ask your health care provider how often you should have your eyes checked.  Personal lifestyle choices, including:  Daily care of your teeth and gums.  Regular physical activity.  Eating a healthy diet.  Avoiding tobacco and drug use.  Limiting alcohol use.  Practicing safe sex.  Taking low doses of aspirin every day.  Taking vitamin and mineral supplements as recommended by your health care provider. What happens during an annual well check? The services and screenings done by your health care provider during your annual well check will depend on your age, overall health, lifestyle risk factors, and family history of disease. Counseling  Your health care provider may ask you questions about your:  Alcohol use.  Tobacco use.  Drug use.  Emotional well-being.  Home and relationship well-being.  Sexual activity.  Eating  habits.  History of falls.  Memory and ability to understand (cognition).  Work and work Statistician. Screening  You may have the following tests or measurements:  Height, weight, and BMI.  Blood pressure.  Lipid and cholesterol levels. These may be checked every 5 years, or more frequently if you are over 57 years old.  Skin check.  Lung cancer screening. You may have this screening every year starting at age 28 if you have a 30-pack-year history of smoking and currently smoke or have quit within the past 15 years.  Fecal occult blood test (FOBT) of the stool. You may have this test every year starting at age 63.  Flexible sigmoidoscopy or colonoscopy. You may have a sigmoidoscopy every 5 years or a colonoscopy every 10 years starting at age 35.  Prostate cancer screening. Recommendations will vary depending on your family history and other risks.  Hepatitis C blood test.  Hepatitis B blood test.  Sexually transmitted disease (STD) testing.  Diabetes screening. This is done by checking your blood sugar (glucose) after you have not eaten for a while (fasting). You may have this done every 1-3 years.  Abdominal aortic aneurysm (AAA) screening. You may need this if you are a current or former smoker.  Osteoporosis. You may be screened starting at age 14 if you are at high risk. Talk with your health care provider about your test results, treatment options, and if necessary, the need for more tests. Vaccines  Your health care provider may recommend certain vaccines, such as:  Influenza vaccine. This is recommended every year.  Tetanus, diphtheria, and acellular pertussis (Tdap, Td) vaccine. You may need a Td booster every 10  years.  Zoster vaccine. You may need this after age 94.  Pneumococcal 13-valent conjugate (PCV13) vaccine. One dose is recommended after age 76.  Pneumococcal polysaccharide (PPSV23) vaccine. One dose is recommended after age 64. Talk to your health  care provider about which screenings and vaccines you need and how often you need them. This information is not intended to replace advice given to you by your health care provider. Make sure you discuss any questions you have with your health care provider. Document Released: 02/17/2015 Document Revised: 10/11/2015 Document Reviewed: 11/22/2014 Elsevier Interactive Patient Education  2017 [place] Prevention in the Home Falls can cause injuries. They can happen to people of all ages. There are many things you can do to make your home safe and to help prevent falls. What can I do on the outside of my home?  Regularly fix the edges of walkways and driveways and fix any cracks.  Remove anything that might make you trip as you walk through a door, such as a raised step or threshold.  Trim any bushes or trees on the path to your home.  Use bright outdoor lighting.  Clear any walking paths of anything that might make someone trip, such as rocks or tools.  Regularly check to see if handrails are loose or broken. Make sure that both sides of any steps have handrails.  Any raised decks and porches should have guardrails on the edges.  Have any leaves, snow, or ice cleared regularly.  Use sand or salt on walking paths during winter.  Clean up any spills in your garage right away. This includes oil or grease spills. What can I do in the bathroom?  Use night lights.  Install grab bars by the toilet and in the tub and shower. Do not use towel bars as grab bars.  Use non-skid mats or decals in the tub or shower.  If you need to sit down in the shower, use a plastic, non-slip stool.  Keep the floor dry. Clean up any water that spills on the floor as soon as it happens.  Remove soap buildup in the tub or shower regularly.  Attach bath mats securely with double-sided non-slip rug tape.  Do not have throw rugs and other things on the floor that can make you trip. What can I do  in the bedroom?  Use night lights.  Make sure that you have a light by your bed that is easy to reach.  Do not use any sheets or blankets that are too big for your bed. They should not hang down onto the floor.  Have a firm chair that has side arms. You can use this for support while you get dressed.  Do not have throw rugs and other things on the floor that can make you trip. What can I do in the kitchen?  Clean up any spills right away.  Avoid walking on wet floors.  Keep items that you use a lot in easy-to-reach places.  If you need to reach something above you, use a strong step stool that has a grab bar.  Keep electrical cords out of the way.  Do not use floor polish or wax that makes floors slippery. If you must use wax, use non-skid floor wax.  Do not have throw rugs and other things on the floor that can make you trip. What can I do with my stairs?  Do not leave any items on the stairs.  Make sure that  there are handrails on both sides of the stairs and use them. Fix handrails that are broken or loose. Make sure that handrails are as long as the stairways.  Check any carpeting to make sure that it is firmly attached to the stairs. Fix any carpet that is loose or worn.  Avoid having throw rugs at the top or bottom of the stairs. If you do have throw rugs, attach them to the floor with carpet tape.  Make sure that you have a light switch at the top of the stairs and the bottom of the stairs. If you do not have them, ask someone to add them for you. What else can I do to help prevent falls?  Wear shoes that:  Do not have high heels.  Have rubber bottoms.  Are comfortable and fit you well.  Are closed at the toe. Do not wear sandals.  If you use a stepladder:  Make sure that it is fully opened. Do not climb a closed stepladder.  Make sure that both sides of the stepladder are locked into place.  Ask someone to hold it for you, if possible.  Clearly mark  and make sure that you can see:  Any grab bars or handrails.  First and last steps.  Where the edge of each step is.  Use tools that help you move around (mobility aids) if they are needed. These include:  Canes.  Walkers.  Scooters.  Crutches.  Turn on the lights when you go into a dark area. Replace any light bulbs as soon as they burn out.  Set up your furniture so you have a clear path. Avoid moving your furniture around.  If any of your floors are uneven, fix them.  If there are any pets around you, be aware of where they are.  Review your medicines with your doctor. Some medicines can make you feel dizzy. This can increase your chance of falling. Ask your doctor what other things that you can do to help prevent falls. This information is not intended to replace advice given to you by your health care provider. Make sure you discuss any questions you have with your health care provider. Document Released: 11/17/2008 Document Revised: 06/29/2015 Document Reviewed: 02/25/2014 Elsevier Interactive Patient Education  2017 [employer].

## 2017-10-10 ENCOUNTER — Encounter: Payer: Self-pay | Admitting: *Deleted

## 2017-10-10 ENCOUNTER — Encounter: Payer: Self-pay | Admitting: Family

## 2017-10-10 ENCOUNTER — Non-Acute Institutional Stay (SKILLED_NURSING_FACILITY): Payer: Medicare Other | Admitting: Family

## 2017-10-10 DIAGNOSIS — K2100 Gastro-esophageal reflux disease with esophagitis, without bleeding: Secondary | ICD-10-CM

## 2017-10-10 DIAGNOSIS — E039 Hypothyroidism, unspecified: Secondary | ICD-10-CM

## 2017-10-10 DIAGNOSIS — L89322 Pressure ulcer of left buttock, stage 2: Secondary | ICD-10-CM | POA: Diagnosis not present

## 2017-10-10 DIAGNOSIS — L89151 Pressure ulcer of sacral region, stage 1: Secondary | ICD-10-CM

## 2017-10-10 DIAGNOSIS — K5901 Slow transit constipation: Secondary | ICD-10-CM

## 2017-10-10 DIAGNOSIS — I5042 Chronic combined systolic (congestive) and diastolic (congestive) heart failure: Secondary | ICD-10-CM

## 2017-10-10 DIAGNOSIS — G2581 Restless legs syndrome: Secondary | ICD-10-CM

## 2017-10-10 DIAGNOSIS — L89312 Pressure ulcer of right buttock, stage 2: Secondary | ICD-10-CM | POA: Diagnosis not present

## 2017-10-10 DIAGNOSIS — G903 Multi-system degeneration of the autonomic nervous system: Secondary | ICD-10-CM

## 2017-10-10 NOTE — Progress Notes (Signed)
Location:  [place] Room Number: 72 Place of Service:  SNF (31) Provider: Dinah Ngetich FNP-C   Blanchie Serve, MD  Patient Care Team: Blanchie Serve, MD as PCP - General (Internal Medicine) Croitoru, Dani Gobble, MD as Attending Physician (Cardiology) Vevelyn Royals, MD as Consulting Physician (Ophthalmology) Franchot Gallo, MD as Consulting Physician (Urology) Tanda Rockers, MD as Consulting Physician (Pulmonary Disease) Allyn Kenner, MD (Dermatology) Deliah Goody, PA-C as Physician Assistant (Physician Assistant) Ngetich, Nelda Bucks, NP as Nurse Practitioner (Family Medicine)  Extended Emergency Contact Information Primary Emergency Contact: Hopfensperger,[name] Address: [address], [place] Phone: [phone] Mobile Phone: [phone] Relation: Spouse Secondary Emergency Contact: [name],[place]  [place] Mobile Phone: [phone] Relation: Daughter  Code Status:  Full Code  Goals of care: Advanced Directive information Advanced Directives 10/10/2017  Does Patient Have a Medical Advance Directive? Yes  Type of Paramedic of [place];Living will  Does patient want to make changes to medical advance directive? -  Copy of [place] in Chart? Yes  Would patient like information on creating a medical advance directive? -  Pre-existing out of facility DNR order (yellow form or pink MOST form) -     Chief Complaint  Patient presents with  . Medical Management of Chronic Issues    monthly routine visit; also Stage 2 pressure sore to right buttock    HPI:  Pt is a 84 y.o. male seen today [facility] for medical management of chronic diseases. He has a medical history of Hypotension,CAD,Combined systolic and diastolic Heart failure,SSS,TIA, OA,urinary retention,depression with anxiety among other conditions.He is seen in his room today with facility  charge Nurse and CNA at bedside.Nurse reports patient has Pressure ulcer on right buttock.Patient states area tender to touch.No fever or chills.No recent fall episode.He has had a 2.6 lbs weight loss over the past one month.He requires transfer via hoyer lift. He states his appetite has been good.He continues to work with therapy.     Past Medical History:  Diagnosis Date  . Arthritis    "minor, back and sometimes knees" (12/15/2012)  . Bradycardia    AFib/SSS s/p [employer] PPM 04/12/2008  . CAD (coronary artery disease) 12/30/2014   CABG (LIMA-LAD, SVG-RCA, SVG-OM in 1996).  07/2009 BMS to SVG-RCA. Cath in 04/2010 with patent stents   . Cardiomyopathy, ischemic 08/25/2012  . CHF (congestive heart failure) ([place])   . Chronic knee pain 12/03/2014  . Combined congestive systolic and diastolic heart failure ([place]) 02/02/2015   Hx EF 41%. BNP 96.8 02/21/15 Torsemide 04/06/15 Na 142, K 4.6, Bun 16, creat 0.89 04/20/14 BNP 111.7, Na 142, K 4.6, Bun 16, creat 0.9   . Depression with anxiety 02/02/2015   02/21/15 Hgb A1c 5.8 03/10/15 MMSE 30/30   . Dizziness, after diuretic asscoiated with hypotension and responded to fluid bolus 06/05/2011   04/28/15 US carotid R+L normal bilateral arterial velocities.    Marland Kitchen Dyspnea 08/11/2014   Followed in Pulmonary clinic/ [facility]/ [place]  - 08/11/2014  Walked RA x 1 laps @ 185 ft each stopped due to fatigue/off balance/ slow pace/  no sob or desat  - PFT's  09/26/2014  FEV1 2.26 (85 % ) ratio 76  p no % improvement from saba with DLCO  67 % corrects to 93 % for alv volume      Since prev study 08/04/13  minimal change lung vol or dlco    . Embolic cerebral infarction ([place]) 12/06/2015  . Exertional shortness of breath    "sometimes walking" (12/15/2012)  . GERD (gastroesophageal reflux disease)   . Gout 02/09/2015  . Heart murmur    "just told I had one today" (12/15/2012)  . Hiatal hernia   . Hyperlipidemia   . Hypertension   . Hypothyroid   . Influenza A 03/10/2016  .  Insomnia   . Melanoma of back ([place]) 1976  . Myocardial infarction [facility]) 1996; 2011   "both silent" (12/15/2012)  . Nonrheumatic aortic valve stenosis   . Orthostatic hypotension   . Osteoporosis, senile   . Pacemaker   . RBBB   . Restless leg 02/02/2015  . Right leg weakness 12/06/2015  . [facility] spotted fever   . S/P CABG x 4   . Sick sinus syndrome ([place]) 01/31/2014  . Sustained ventricular tachycardia ([place]) 07/27/2014  . Urinary retention 12/30/2014   Past Surgical History:  Procedure Laterality Date  . CARDIAC CATHETERIZATION  04/2010   LIMA to LAD patent,SVG to OM patent,no in-stnet restenosis RCA  . CATARACT EXTRACTION W/ INTRAOCULAR LENS  IMPLANT, BILATERAL Bilateral 2012  . CORONARY ANGIOPLASTY WITH STENT PLACEMENT  07/2009   bare metal stent to SVG to the RCA  . CORONARY ARTERY BYPASS GRAFT  1996   LIMA to LAD,SVG to RCA & SVG to OM  . INSERT / REPLACE / REMOVE PACEMAKER  2010  . MELANOMA EXCISION  05/1974 X2   "taken off my back" (12/15/2012)  . NM MYOVIEW LTD  06/2011   low risk  . PPM GENERATOR CHANGEOUT N/A 01/22/2017   Procedure: PPM GENERATOR CHANGEOUT;  Surgeon: [name], MD;  Location: [place] CV LAB;  Service: Cardiovascular;  Laterality: N/A;  . TONSILLECTOMY  1938  . TRANSURETHRAL RESECTION OF PROSTATE  1986  . US ECHOCARDIOGRAPHY  07/11/2009   EF 45-50%    Allergies  Allergen Reactions  . Altace [Ramipril] Cough  . Crestor [Rosuvastatin Calcium] Rash  . Penicillins Rash and Other (See Comments)    Has patient had a PCN reaction causing immediate rash, facial/tongue/throat swelling, SOB or lightheadedness with hypotension: Yes Has patient had a PCN reaction causing severe rash involving mucus membranes or skin necrosis: No Has patient had a PCN reaction that required hospitalization No Has patient had a PCN reaction occurring within the last 10 years: No If all of the above answers are "NO", then may proceed with Cephalosporin use.   .  Sulfa Antibiotics Rash    Allergies as of 10/10/2017      Reactions   Altace [ramipril] Cough   Crestor [rosuvastatin Calcium] Rash   Penicillins Rash, Other (See Comments)   Has patient had a PCN reaction causing immediate rash, facial/tongue/throat swelling, SOB or lightheadedness with hypotension: Yes Has patient had a PCN reaction causing severe rash involving mucus membranes or skin necrosis: No Has patient had a PCN reaction that required hospitalization No Has patient had a PCN reaction occurring within the last 10 years: No If all of the above answers are "NO", then may proceed with Cephalosporin use.   Sulfa Antibiotics Rash      Medication List        Accurate as of 10/10/17  1:34 PM. Always use your most recent med list.          acetaminophen 500 MG tablet Commonly known as:  TYLENOL Take 1,000 mg by mouth 3 (three) times  daily.   amiodarone 100 MG tablet Commonly known as:  PACERONE Take 1 tablet (100 mg total) by mouth daily.   aspirin EC 81 MG tablet Take 1 tablet (81 mg total) by mouth daily.   clopidogrel 75 MG tablet Commonly known as:  PLAVIX TAKE 1 TABLET ONCE DAILY.   Droxidopa 100 MG Caps Take 100 mg by mouth 3 (three) times daily.   guaiFENesin 100 MG/5ML Soln Commonly known as:  ROBITUSSIN Take 5 mLs (100 mg total) by mouth every 4 (four) hours as needed for cough or to loosen phlegm.   HYDROcodone-acetaminophen 5-325 MG tablet Commonly known as:  NORCO/VICODIN Take 2 tablets by mouth every 4 (four) hours as needed for severe pain.   HYDROcodone-acetaminophen 5-325 MG tablet Commonly known as:  NORCO/VICODIN Take 1 tablet by mouth every 4 (four) hours as needed for severe pain.   levothyroxine 100 MCG tablet Commonly known as:  SYNTHROID, LEVOTHROID Take 100 mcg by mouth daily before breakfast.   Lidocaine 4 % Ptch Apply 1 patch topically daily. Left shoulder   meclizine 25 MG tablet Commonly known as:  ANTIVERT Take 1 tablet (25 mg  total) by mouth 2 (two) times daily as needed for dizziness.   Melatonin 3 MG Tabs Take 3 mg by mouth at bedtime.   methocarbamol 500 MG tablet Commonly known as:  ROBAXIN Take 250 mg by mouth 2 (two) times daily.   midodrine 10 MG tablet Commonly known as:  PROAMATINE TAKE 1 TABLET THREE TIMES DAILY. TAKE LAST DOSE AT LEAST 6 HOURS BEFORE BEDTIME.   pantoprazole 40 MG tablet Commonly known as:  PROTONIX TAKE 1 TABLET BY MOUTH TWICE DAILY.   polyethylene glycol packet Commonly known as:  MIRALAX / GLYCOLAX Take 17 g by mouth daily. Hold for loose stool   pravastatin 40 MG tablet Commonly known as:  PRAVACHOL Take 1 tablet (40 mg total) by mouth at bedtime.   rOPINIRole 0.5 MG tablet Commonly known as:  REQUIP TAKE 1 TABLET 3 TIMES A DAY AS NEEDED.   sennosides-docusate sodium 8.6-50 MG tablet Commonly known as:  SENOKOT-S Take 2 tablets by mouth at bedtime.   torsemide 10 MG tablet Commonly known as:  DEMADEX Take 5 mg by mouth daily.   traZODone 50 MG tablet Commonly known as:  DESYREL Take 1 tablet (50 mg total) by mouth at bedtime.   Vitamin D3 5000 units Caps Take 5,000 Units by mouth every Monday.       Review of Systems  Constitutional: Positive for unexpected weight change. Negative for appetite change, chills, fatigue and fever.  HENT: Positive for hearing loss. Negative for congestion, rhinorrhea, sinus pressure, sinus pain, sneezing and sore throat.   Eyes: Negative for pain, discharge and redness.       Wears eye glasses   Respiratory: Negative for cough, chest tightness, shortness of breath and wheezing.   Cardiovascular: Negative for chest pain, palpitations and leg swelling.  Gastrointestinal: Negative for abdominal distention, abdominal pain, constipation, diarrhea, nausea and vomiting.  Endocrine: Negative for cold intolerance, heat intolerance, polydipsia, polyphagia and polyuria.  Genitourinary: Negative for dysuria, flank pain and urgency.        Indwelling foley catheter   Musculoskeletal: Positive for arthralgias and gait problem.  Skin: Negative for color change, pallor and rash.       Right gluteal Pressure ulcer   Neurological: Negative for dizziness, light-headedness and headaches.  Hematological: Does not bruise/bleed easily.  Psychiatric/Behavioral: Negative for agitation and sleep disturbance.  The patient is not nervous/anxious.     Immunization History  Administered Date(s) Administered  . Influenza, High Dose Seasonal PF 11/01/2014, 11/13/2016  . Influenza-Unspecified 12/05/2013, 11/05/2014, 11/16/2015  . PPD Test 03/07/2014  . Pneumococcal Conjugate-13 10/05/2009  . Pneumococcal Polysaccharide-23 11/05/2005  . Pneumococcal-Unspecified 10/05/2009  . Tdap 08/02/2014  . Zoster 11/05/2005   Pertinent  Health Maintenance Due  Topic Date Due  . PNA vac Low Risk Adult (2 of 2 - PCV13) 10/06/2010  . INFLUENZA VACCINE  09/04/2017  . DEXA SCAN  12/14/2024   Fall Risk  10/09/2017 10/02/2016 10/30/2015 09/05/2015 02/02/2015  Falls in the past year? No No No No No  Risk for fall due to : - - Impaired balance/gait - -   Functional Status Survey: Is the patient deaf or have difficulty hearing?: Yes Does the patient have difficulty seeing, even when wearing glasses/contacts?: Yes Does the patient have difficulty concentrating, remembering, or making decisions?: Yes Does the patient have difficulty walking or climbing stairs?: Yes Does the patient have difficulty dressing or bathing?: Yes Does the patient have difficulty doing errands alone such as visiting a doctor's office or shopping?: Yes  Vitals:   10/10/17 1058  BP: (!) 144/90  Pulse: 69  Resp: 20  Temp: 97.8 F (36.6 C)  SpO2: 95%  Weight: 168 lb 6.4 oz (76.4 kg)  Height: 5\' 10"  (1.778 m)   Body mass index is 24.16 kg/m. Physical Exam  Constitutional: He appears well-developed.  Elderly in no acute distress   HENT:  Head: Normocephalic.  Right Ear:  External ear normal.  Left Ear: External ear normal.  Mouth/Throat: Oropharynx is clear and moist. No oropharyngeal exudate.  Eyes: Pupils are equal, round, and reactive to light. Conjunctivae and EOM are normal. Right eye exhibits no discharge. Left eye exhibits no discharge. No scleral icterus.  Neck: Neck supple. No JVD present. No thyromegaly present.  Cardiovascular: Intact distal pulses. Exam reveals no gallop and no friction rub.  Murmur heard. Pulmonary/Chest: Effort normal and breath sounds normal. No respiratory distress. He has no wheezes. He has no rales.  Abdominal: Soft. Bowel sounds are normal. He exhibits no distension and no mass. There is no tenderness. There is no guarding. A hernia is present.  Genitourinary: Right testis shows swelling. Right testis shows no tenderness. Left testis shows swelling. Left testis shows no tenderness.  Genitourinary Comments: Indwelling foley catheter draining adequate amounts of clear yellow urine   Musculoskeletal: He exhibits no edema or tenderness.  Moves x 4 extremities.Requires transfer via hoyer lift Left arm dependent edema has improved.   Lymphadenopathy:    He has no cervical adenopathy.  Neurological:  Alert but forgetful  MMSE scored 22/30 on 10/09/2017   Skin: Skin is warm and dry. No pallor.  Left and  Right gluteal 2 pressure ulcer wound bed red without any drainage surrounding skin tissue on coccyx area dark purple in color non-blanchable with stage 1 linear ulcer red in color and tender to touch.     Psychiatric: He has a normal mood and affect. His speech is normal and behavior is normal. Judgment and thought content normal.  Nursing note and vitals reviewed.   Labs reviewed: Recent Labs    08/26/17 0426 08/27/17 0423 08/29/17 1022 08/31/17 0342 09/04/17 1054 09/08/17 09/11/17 09/12/17  NA 139 142 140 139 140 137 136 136*  K 4.2 3.8 3.8 3.7 3.4* 4.4 4.2 4.2  CL 107 108 106 106 105  --   --   --  CO2 25 26 25 25 26    --   --   --   GLUCOSE 104* 109* 113* 120* 94  --   --   --   BUN 13 18 15 17 15 14 17   --   CREATININE 0.49* 0.57* 0.55* 0.51* 0.51* 0.61 0.59 0.6  CALCIUM 8.6* 8.5* 8.8* 8.3* 8.0* 8.6 8.2  --   MG 2.2 2.1  --   --   --   --   --   --   PHOS 3.4  --   --   --   --   --   --   --    Recent Labs    08/27/17 0423 09/04/17 1054 09/11/17 09/12/17  AST 22 33 20 20  ALT 17 45* 25 25  ALKPHOS 59 61 76 76  BILITOT 0.6 0.3 0.3  --   PROT 6.2* 5.8* 5.8  --   ALBUMIN 2.5* 2.4* 3.0  --    Recent Labs    04/07/17 2358  08/27/17 0423  08/31/17 0342 09/04/17 1054 09/08/17 09/11/17 09/12/17  WBC 8.6   < > 8.9   < > 8.1 12.2* 9.5 7.6 7.6  NEUTROABS 6.2  --  6.5  --   --  8.9*  --   --   --   HGB 13.7   < > 12.1*   < > 12.4* 12.8* 12.9 12.8 12.8*  HCT 41.1   < > 36.9*   < > 38.4* 39.2 38.9 38.8 39*  MCV 97.4   < > 97.1   < > 97.0 97.3 93.5 94.6  --   PLT 230   < > 243   < > 339 354  --   --  300   < > = values in this interval not displayed.   Lab Results  Component Value Date   TSH 1.125 08/26/2017   Lab Results  Component Value Date   HGBA1C 5.6 12/07/2015   Lab Results  Component Value Date   CHOL 103 05/05/2017   HDL 39 (L) 05/05/2017   LDLCALC 43 05/05/2017   TRIG 129 05/05/2017   CHOLHDL 2.6 05/05/2017    Significant Diagnostic Results in last 30 days:  No results found.  Assessment/Plan 1. Pressure ulcer of left buttock, stage 2 Afebrile.wound bed red without any drainage or signs of infection.Facility Nurse to cleanse ulcer with saline,pat dry,apply small amounts of hydrogel to keep wound bed moist,cover with alleyvn dressing for extra cushioning.change dressing every 3 days and as needed if soiled until resolved.  - Continue on alternating air mattress to prevent further skin breakdown.Provide gel cushion for wheelchair/recliner. - continue on Prostat 30 mls by mouth daily to promote wound healing.follow up with Registered Dietician.  - start Decubi-vit one by mouth  daily to promote wound healing.     2. Pressure ulcer of right buttock, stage 2 No signs of infection.wound care as above.   3. Pressure ulcer of coccygeal region, stage 1 Linear ulcer without any signs of infections.surrounding skin tissue dark purple in color nonblanchable.continue wound care as above.continue to monitor.   4. Hypothyroidism, unspecified type Lab Results  Component Value Date   TSH 1.125 08/26/2017  Continue on Levothyroxine 100 mcg tablet daily before breakfast.monitor TSH level.   5. Chronic combined systolic and diastolic congestive heart failure (HCC) No abrupt weight gain,shortness of breath,cough or edema.Bilateral lung CTA.continue on torsemide 5 mg tablet daily.  6. Neurogenic orthostatic hypotension  B/p reviewed stable.continue  on midodrine 10 mg tablet three times daily and Droxidopa 100 mg capsule three times daily.     7. RLS (restless legs syndrome) Continue on Requip 0.5 mg tablet three times daily as needed.   8. Gastroesophageal reflux disease with esophagitis Continue on pantoprazole 40 mg tablet daily.  9. Slow transit constipation Senokot-S 8.6-50 mg tablet two tablets daily at bedtime effective.continue to encourage oral intake and hydration.  Family/ staff Communication: Reviewed plan of care with patient,wife and facility Nurse.  Labs/tests ordered: None   Dinah C Ngetich, NP

## 2017-10-26 ENCOUNTER — Encounter: Payer: Self-pay | Admitting: Internal Medicine

## 2017-10-26 LAB — CBC AND DIFFERENTIAL
HCT: 39 — AB (ref 41–53)
Hemoglobin: 13.4 — AB (ref 13.5–17.5)
Platelets: 297 (ref 150–399)
WBC: 9.4

## 2017-10-27 ENCOUNTER — Encounter: Payer: Self-pay | Admitting: Internal Medicine

## 2017-10-27 ENCOUNTER — Telehealth: Payer: Self-pay | Admitting: Cardiovascular Disease

## 2017-10-27 NOTE — Telephone Encounter (Signed)
Thanks for the update.  Please keep OV for next week as scheduled for 11/03/17 and bring current mediation to the appointment.

## 2017-10-27 NOTE — Telephone Encounter (Signed)
New Message    Pt c/o medication issue:  1. Name of Medication: northera    2. How are you currently taking this medication (dosage and times per day)?   3. Are you having a reaction (difficulty breathing--STAT)?   4. What is your medication issue? Patients wife called to advise that the patients left side from the hand to the elbow is puffy and blue. The nurses at [facility] has stopped him from taking the northera

## 2017-10-27 NOTE — Telephone Encounter (Signed)
Spoke with pt and the pt's wife. Adv them of Raquel, RPH recommendation. OK to HOLD [name] if indicated. Pt was assessed by the nurse an [facility]. Pt should keep his appt with Dr.C for 9/30 @ 2:20pm. Pt should bring his currents meds with him. Dr.C will address resuming/titrating at o/v.  Pt wife verbalized understanding.

## 2017-10-29 ENCOUNTER — Telehealth: Payer: Self-pay | Admitting: Cardiovascular Disease

## 2017-10-29 NOTE — Telephone Encounter (Signed)
[name] clarification per patient's daughter.  * Patient bru[facility] his arm at the SNF. DVT and fractures were ruled out by X-Ray and doppler . He still taking [name] as written.    Will follow up with Dr Sallyanne Kuster as scheduled on 11/03/17  No new recommendations' given today.

## 2017-10-29 NOTE — Telephone Encounter (Signed)
New Message:    Patient daughter is calling to make sure the nothing was faxed to the nursing home to stop any of her father medication   She believe her mother has called before to stop his medication and that is not what she would like to happen

## 2017-10-29 NOTE — Telephone Encounter (Signed)
See tel encounter dated 10/27/17. Message forwarded to pharmacy to advise

## 2017-10-29 NOTE — Telephone Encounter (Signed)
Follow-up    Patient's wife need to tell Adam [name] that [facility] needs to be stopped. Please fax the information to [phone].

## 2017-11-03 ENCOUNTER — Encounter: Payer: Self-pay | Admitting: Cardiovascular Disease

## 2017-11-03 ENCOUNTER — Ambulatory Visit: Payer: Medicare Other | Admitting: Cardiovascular Disease

## 2017-11-03 VITALS — BP 120/68 | HR 72 | Ht 70.0 in

## 2017-11-03 DIAGNOSIS — I5042 Chronic combined systolic (congestive) and diastolic (congestive) heart failure: Secondary | ICD-10-CM

## 2017-11-03 DIAGNOSIS — I472 Ventricular tachycardia, unspecified: Secondary | ICD-10-CM

## 2017-11-03 DIAGNOSIS — Z79899 Other long term (current) drug therapy: Secondary | ICD-10-CM

## 2017-11-03 DIAGNOSIS — Z8673 Personal history of transient ischemic attack (TIA), and cerebral infarction without residual deficits: Secondary | ICD-10-CM

## 2017-11-03 DIAGNOSIS — I495 Sick sinus syndrome: Secondary | ICD-10-CM

## 2017-11-03 DIAGNOSIS — Z950 Presence of cardiac pacemaker: Secondary | ICD-10-CM

## 2017-11-03 DIAGNOSIS — I951 Orthostatic hypotension: Secondary | ICD-10-CM

## 2017-11-03 DIAGNOSIS — I25810 Atherosclerosis of coronary artery bypass graft(s) without angina pectoris: Secondary | ICD-10-CM

## 2017-11-03 DIAGNOSIS — Z5181 Encounter for therapeutic drug level monitoring: Secondary | ICD-10-CM | POA: Diagnosis not present

## 2017-11-03 MED ORDER — MIDODRINE HCL 5 MG PO TABS: 5.0000 mg | ORAL_TABLET | Freq: Three times a day (TID) | ORAL | 6 refills | Status: DC

## 2017-11-03 MED ORDER — MIDODRINE HCL 10 MG PO TABS: 5.0000 mg | ORAL_TABLET | Freq: Three times a day (TID) | ORAL | 1 refills | Status: DC

## 2017-11-03 NOTE — Patient Instructions (Signed)
Dr Sallyanne Kuster has recommended making the following medication changes: 1. DECREASE Midodrine to 5 mg three times daily  Remote monitoring is used to monitor your Pacemaker or ICD from home. This monitoring reduces the number of office visits required to check your device to one time per year. It allows [place] to keep an eye on the functioning of your device to ensure it is working properly. You are scheduled for a device check from home on Monday, December 30th, 2019. You may send your transmission at any time that day. If you have a wireless device, the transmission will be sent automatically. After your physician reviews your transmission, you will receive a postcard with your next transmission date.  To improve our patient care and to more adequately follow your device, [facility] has decided, as a practice, to start following each patient four times a year with your home monitor. This means that you may experience a remote appointment that is close to an in-office appointment with your physician. Your insurance will apply at the same rate as other remote monitoring transmissions.  Dr Sallyanne Kuster recommends that you schedule a follow-up appointment in 6 months with a pacemaker check. You will receive a reminder letter in the mail two months in advance. If you don't receive a letter, please call our office to schedule the follow-up appointment.  If you need a refill on your cardiac medications before your next appointment, please call your pharmacy.

## 2017-11-03 NOTE — Progress Notes (Signed)
Patient ID: Adam [name], male   DOB: [birth year], 84 y.o.   MRN: [id number]    Cardiology Office Note    Date:  11/08/2017   ID:  Adam [name], DOB 07-21-1926, MRN [id number]  PCP:  Wardell Honour, MD  Cardiologist:   [name], MD   Chief complaint: pacemaker, orthostatic hypotension  History of Present Illness:  Adam [name] is a 84 y.o. male with a long-standing history of coronary artery disease and previous bypass surgery with an extensive scar from inferior wall myocardial infarction, ischemic cardiomyopathy with combined systolic and diastolic heart failure, history of sustained symptomatic ventricular tachycardia (probably "scar VT"), previous ischemic stroke, sinus node dysfunction with dual chamber permanent pacemaker (generator change 2018 [employer]), severe orthostatic hypotension that is felt to be neurogenic in etiology.   Has done better from the point of view of dizziness, on the [name]/ProAmatine combo.  The episodes are rare and are not necessarily associated with changes in position.  The typical blood pressure is in the 130s and 140s readings over 160.  Today's blood pressure is low at 120/68.  He has not had any problems with chest pain or shortness of breath or leg edema.  He denies palpitations and syncope.  He was hospitalized in July for severe arthritis of the cervical spine and spent about 2 weeks at [facility] and since then has been undergoing rehab and physical therapy.  There is a noticeable reduction in his activity level (logged by his pacemaker) from the average of 2 hours of daily down to 0.5 hours a day.  He is planning cervical spine injections with Dr. Ronnald Ramp and Dr. Rolena Infante.  He is on chronic dual antiplatelet therapy following a November 2017 ischemic stroke, but does not take anticoagulants.  Pacemaker function is normal with battery at beginning of life, estimated longevity 8.9 years.  He does not have any detectable P waves, he is "atrially  dependent", but he has normal AV conduction most of the time. Lead parameters remain normal.  He has 98% atrial pacing and 39% ventricular pacing.  He has not had any episodes of atrial fibrillation (never had afib) and has not had any recent episodes of ventricular tachycardia (for which he takes amiodarone).  Is very sedentary and denies angina or dyspnea.  He is not aware of any palpitations.  He has not had full-blown syncope.  He now has a permanent Foley in place.  He has CAD and is currently free of angina pectoris. He underwent bypass surgery 1996. He had placement of a stent to the SVG to RCA in 2011. His last cardiac catheterization in 2012 show that vessel to be widely patent but all his native coronary arteries are occluded and he is graft dependent. He is nuclear stress test in December 2015 showed an extensive inferior wall scar without any areas of ischemia. in June 2016 he presented with rapid palpitations associated with near-syncope, dyspnea and extreme fatigue , but did not lose consciousness. Interrogation of his pacemaker showed relatively slow ventricular tachycardia at around 150 bpm that appear to have a regular, monomorphic electrogram. His dose of amiodarone was increased and he has not had ventricular tachycardia since. He was evaluated by Dr. Caryl Comes. After some debate, we decided that a conservative approach is most appropriate in view of his declining overall functional status and advanced age. Unfortunately has also been developing progressive dementia, primarily manifested as short-term memory loss. He is now a resident at [facility].  In November 2017 he presented with a right lower extremity weakness and was diagnosed as having an ischemic stroke. He was prescribed an oral anticoagulant due to suspicion that his stroke was related to atrial fibrillation, but atrial fibrillation has never occurred/has never been recorded by his pacemaker so this medication was  stopped.    Past Medical History:  Diagnosis Date  . Arthritis    "minor, back and sometimes knees" (12/15/2012)  . Bradycardia    AFib/SSS s/p St Jude PPM 04/12/2008  . CAD (coronary artery disease) 12/30/2014   CABG (LIMA-LAD, SVG-RCA, SVG-OM in 1996).  07/2009 BMS to SVG-RCA. Cath in 04/2010 with patent stents   . Cardiomyopathy, ischemic 08/25/2012  . CHF (congestive heart failure) ([place])   . Chronic knee pain 12/03/2014  . Combined congestive systolic and diastolic heart failure ([place]) 02/02/2015   Hx EF 41%. BNP 96.8 02/21/15 Torsemide 04/06/15 Na 142, K 4.6, Bun 16, creat 0.89 04/20/14 BNP 111.7, Na 142, K 4.6, Bun 16, creat 0.9   . Depression with anxiety 02/02/2015   02/21/15 Hgb A1c 5.8 03/10/15 MMSE 30/30   . Dizziness, after diuretic asscoiated with hypotension and responded to fluid bolus 06/05/2011   04/28/15 US carotid R+L normal bilateral arterial velocities.    Marland Kitchen Dyspnea 08/11/2014   Followed in Pulmonary clinic/ [facility]/ [place]  - 08/11/2014  Walked RA x 1 laps @ 185 ft each stopped due to fatigue/off balance/ slow pace/  no sob or desat  - PFT's  09/26/2014  FEV1 2.26 (85 % ) ratio 76  p no % improvement from saba with DLCO  67 % corrects to 93 % for alv volume      Since prev study 08/04/13 minimal change lung vol or dlco    . Embolic cerebral infarction (Adam [name]) 12/06/2015  . Exertional shortness of breath    "sometimes walking" (12/15/2012)  . GERD (gastroesophageal reflux disease)   . Gout 02/09/2015  . Heart murmur    "just told I had one today" (12/15/2012)  . Hiatal hernia   . Hyperlipidemia   . Hypertension   . Hypothyroid   . Influenza A 03/10/2016  . Insomnia   . Melanoma of back ([place]) 1976  . Myocardial infarction [facility]) 1996; 2011   "both silent" (12/15/2012)  . Nonrheumatic aortic valve stenosis   . Orthostatic hypotension   . Osteoporosis, senile   . Pacemaker   . RBBB   . Restless leg 02/02/2015  . Right leg weakness 12/06/2015  . [facility] spotted fever    . S/P CABG x 4   . Sick sinus syndrome ([place]) 01/31/2014  . Sustained ventricular tachycardia ([place]) 07/27/2014  . Urinary retention 12/30/2014    Past Surgical History:  Procedure Laterality Date  . CARDIAC CATHETERIZATION  04/2010   LIMA to LAD patent,SVG to OM patent,no in-stnet restenosis RCA  . CATARACT EXTRACTION W/ INTRAOCULAR LENS  IMPLANT, BILATERAL Bilateral 2012  . CORONARY ANGIOPLASTY WITH STENT PLACEMENT  07/2009   bare metal stent to SVG to the RCA  . CORONARY ARTERY BYPASS GRAFT  1996   LIMA to LAD,SVG to RCA & SVG to OM  . INSERT / REPLACE / REMOVE PACEMAKER  2010  . MELANOMA EXCISION  05/1974 X2   "taken off my back" (12/15/2012)  . NM MYOVIEW LTD  06/2011   low risk  . PPM GENERATOR CHANGEOUT N/A 01/22/2017   Procedure: PPM GENERATOR CHANGEOUT;  Surgeon: [name], MD;  Location: [facility];  Service: Cardiovascular;  Laterality: N/A;  . TONSILLECTOMY  1938  . TRANSURETHRAL RESECTION OF PROSTATE  1986  . US ECHOCARDIOGRAPHY  07/11/2009   EF 45-50%    Current Medications: Outpatient Medications Prior to Visit  Medication Sig Dispense Refill  . acetaminophen (TYLENOL) 500 MG tablet Take 500 mg by mouth 3 (three) times daily. Total daily Tylenol dose should not exceed 3000 mg    . amiodarone (PACERONE) 100 MG tablet Take 1 tablet (100 mg total) by mouth daily. 90 tablet 3  . aspirin EC 81 MG tablet Take 1 tablet (81 mg total) by mouth daily. 30 tablet 1  . Cholecalciferol (VITAMIN D3) 5000 UNITS CAPS Take 5,000 Units by mouth every Monday.     . clopidogrel (PLAVIX) 75 MG tablet TAKE 1 TABLET ONCE DAILY. 90 tablet 1  . Droxidopa 100 MG CAPS Take 100 mg by mouth 3 (three) times daily. 90 capsule 6  . guaiFENesin (ROBITUSSIN) 100 MG/5ML SOLN Take 5 mLs (100 mg total) by mouth every 4 (four) hours as needed for cough or to loosen phlegm. 1200 mL 0  . HYDROcodone-acetaminophen (NORCO/VICODIN) 5-325 MG tablet Take 1 tablet by mouth every 4 (four) hours as  needed for severe pain. 30 tablet 0  . levothyroxine (SYNTHROID, LEVOTHROID) 100 MCG tablet Take 100 mcg by mouth daily before breakfast.    . Lidocaine 4 % PTCH Apply 1 patch topically daily. Left shoulder    . meclizine (ANTIVERT) 25 MG tablet Take 1 tablet (25 mg total) by mouth 2 (two) times daily as needed for dizziness. 30 tablet 0  . Melatonin 3 MG TABS Take 3 mg by mouth at bedtime.    . methocarbamol (ROBAXIN) 500 MG tablet Take 250 mg by mouth 2 (two) times daily.     . pantoprazole (PROTONIX) 40 MG tablet TAKE 1 TABLET BY MOUTH TWICE DAILY. 60 tablet 11  . polyethylene glycol (MIRALAX / GLYCOLAX) packet Take 17 g by mouth daily. Hold for loose stool    . pravastatin (PRAVACHOL) 40 MG tablet Take 1 tablet (40 mg total) by mouth at bedtime. 90 tablet 3  . rOPINIRole (REQUIP) 0.5 MG tablet TAKE 1 TABLET 3 TIMES A DAY AS NEEDED. 90 tablet 1  . sennosides-docusate sodium (SENOKOT-S) 8.6-50 MG tablet Take 2 tablets by mouth at bedtime.     . torsemide (DEMADEX) 10 MG tablet Take 5 mg by mouth daily.     . traZODone (DESYREL) 50 MG tablet Take 1 tablet (50 mg total) by mouth at bedtime. 90 tablet 3  . midodrine (PROAMATINE) 10 MG tablet TAKE 1 TABLET THREE TIMES DAILY. TAKE LAST DOSE AT LEAST 6 HOURS BEFORE BEDTIME. 360 tablet 1   No facility-administered medications prior to visit.      Allergies:   Altace [ramipril]; Crestor [rosuvastatin calcium]; Penicillins; Sulfa antibiotics; and Sulfamethoxazole-trimethoprim   Social History   Socioeconomic History  . Marital status: Married    Spouse name: Not on file  . Number of children: 2  . Years of education: Masters  . Highest education level: Not on file  Occupational History  . Occupation: Retired Company secretary -Pensions consultant  Social Needs  . Financial resource strain: Not hard at all  . Food insecurity:    Worry: Never true    Inability: Never true  . Transportation needs:    Medical: No    Non-medical: No  Tobacco Use   . Smoking status: Never Smoker  . Smokeless tobacco: Never Used  Substance and  Sexual Activity  . Alcohol use: No    Alcohol/week: 0.0 standard drinks  . Drug use: No  . Sexual activity: Never  Lifestyle  . Physical activity:    Days per week: 7 days    Minutes per session: 10 min  . Stress: Not at all  Relationships  . Social connections:    Talks on phone: More than three times a week    Gets together: More than three times a week    Attends religious service: Never    Active member of club or organization: No    Attends meetings of clubs or organizations: Never    Relationship status: Married  Other Topics Concern  . Not on file  Social History Narrative   Lives at [place] to [place] 01/09/15   Married - Violet   Never smoked   Alcohol none   Previously employed as Scientist, forensic for [employer].         Diet:Low sodium   Do you drink/eat things with caffeine? No   Marital status: Married                              What year were you married?1950   Do you live in a house, apartment, assisted living, condo, trailer, etc)?    Is it one or more stories? 1   How many persons live in your home? 2   Do you have any pets in your home? No   Current or past profession: Minister, [employer]   Do you exercise?     Very Little                                                 Type & how often:    Do you have a living will?  Yes   Do you have a DNR Form? Yes   Do you have a POA/HPOA forms? Yes     Family History:  The patient's family history includes Anuerysm in his son; Coronary artery disease in his father and mother; Diabetes in his father and mother; Heart disease in his brother, father, and mother; Lung cancer in his father.   ROS:   Please see the history of present illness.    ROS All other systems reviewed and are negative.   PHYSICAL EXAM:   VS:  BP 120/68 (BP Location: Left Arm, Patient Position: Sitting, Cuff Size: Normal)   Pulse 72    Ht 5\' 10"  (1.778 m)   BMI 24.16 kg/m       General: Alert, oriented x3, no distress, appears well, albeit a little frail. Limited ability to rotate his neck Head: no evidence of trauma, PERRL, EOMI, no exophtalmos or lid lag, no myxedema, no xanthelasma; normal ears, nose and oropharynx Neck: normal jugular venous pulsations and no hepatojugular reflux; brisk carotid pulses without delay and no carotid bruits Chest: clear to auscultation, no signs of consolidation by percussion or palpation, normal fremitus, symmetrical and full respiratory excursions Cardiovascular: normal position and quality of the apical impulse, regular rhythm, normal first and widely split second heart sounds, no murmurs, rubs or gallops Abdomen: no tenderness or distention, no masses by palpation, no abnormal pulsatility or arterial bruits, normal bowel sounds, no hepatosplenomegaly Extremities: no clubbing, cyanosis or edema; 2+  radial, ulnar and brachial pulses bilaterally; 2+ right femoral, posterior tibial and dorsalis pedis pulses; 2+ left femoral, posterior tibial and dorsalis pedis pulses; no subclavian or femoral bruits Neurological: grossly nonfocal Psych: Normal mood and affect    Wt Readings from Last 3 Encounters:  11/06/17 177 lb 3.2 oz (80.4 kg)  10/10/17 168 lb 6.4 oz (76.4 kg)  10/09/17 171 lb (77.6 kg)      Studies/Labs Reviewed:   EKG:  EKG is ordered today and shows old RBBB and  anterior fascicular block.  Prolonged AV delay.  QRS 198 ms, QTC 545 ms, unchanged from previous tracing  Recent Labs: 08/25/2017: B Natriuretic Peptide 378.4 08/26/2017: TSH 1.125 08/27/2017: Magnesium 2.1 10/09/2017: ALT 8; BUN 16; Creat 0.64; Potassium 4.2; Sodium 139 10/26/2017: Hemoglobin 13.4; Platelets 297   Lipid Panel    Component Value Date/Time   CHOL 103 05/05/2017 0000   TRIG 129 05/05/2017 0000   HDL 39 (L) 05/05/2017 0000   CHOLHDL 2.6 05/05/2017 0000   VLDL 20 12/07/2015 0449   LDLCALC 43  05/05/2017 0000     ASSESSMENT:    1. VT (ventricular tachycardia) ([place])   2. Encounter for monitoring amiodarone therapy   3. SSS (sick sinus syndrome) ([place])   4. Pacemaker   5. Chronic combined systolic and diastolic congestive heart failure ([place])   6. Orthostatic hypotension   7. Coronary artery disease involving autologous vein coronary bypass graft without angina pectoris   8. History of arterial ischemic stroke      PLAN:  In order of problems listed above:  1. VT: Previous inferior wall scar.  None seen in the last >24 months.  In particular, no VT seen since we reduce the dose of amiodarone to only 100 mg daily.   2. Amiodarone: On a very low dose.  No evidence of toxicity.  Monitor liver and thyroid test every 6 months (normal LFTs on September 5, normal TSH July 30 2017).  Report respiratory symptoms promptly. 3. SSS: He has no intrinsic atrial activity. "Atrially dependent".  Although he has significant AV conduction abnormalities, paces the ventricle less than 40% of the time. 4. PM: Normal device function.  Remote downloads every 3 months and office visits every 6 months 5. CHF: No evidence of hypervolemia by exam today.  At previous heart failure decompensation was prescribed fludrocortisone for orthostatic hypotension.  He is on a very low dose of diuretic. 6. Orthostatic hypotension: Years ago he had severe hypertension requiring multiple agents for control.  Now, orthostatic hypotension is 1 of his most serious source of morbidity.  Has benefited from [place].  We will try to cut back the dose of ProAmatine slightly to avoid supine systolic hypertension.  Reminded that he should not lay down, although he can partly recline after he takes the vasoconstrictor medications 7. CAD: She does not have angina pectoris.  Has an extensive scar, but no reversible ischemia on his most recent nuclear study from November 2014. He has occluded native arteries and is graft dependent (LIMA  to LAD, SVG to RCA, SVG to OM; status post stent in SVG to RCA 2011, patent by cath March 2012). November 2014 nuclear study shows inferior scar without ischemia, EF 41%.Conservative management is recommended. 8. History of CVA: His pacemaker has never shown evidence of atrial arrhythmia (flutter or fibrillation) that could explain his stroke. Anticoagulants are currently not indicated. Continue clopidogrel.  Can temporarily stop antiplatelet agents for spinal injections if necessary.    Medication  Adjustments/Labs and Tests Ordered: Current medicines are reviewed at length with the patient today.  Concerns regarding medicines are outlined above.  Medication changes, Labs and Tests ordered today are listed in the Patient Instructions below. Patient Instructions  Dr Sallyanne Kuster has recommended making the following medication changes: 1. DECREASE Midodrine to 5 mg three times daily  Remote monitoring is used to monitor your Pacemaker or ICD from home. This monitoring reduces the number of office visits required to check your device to one time per year. It allows [place] to keep an eye on the functioning of your device to ensure it is working properly. You are scheduled for a device check from home on Monday, December 30th, 2019. You may send your transmission at any time that day. If you have a wireless device, the transmission will be sent automatically. After your physician reviews your transmission, you will receive a postcard with your next transmission date.  To improve our patient care and to more adequately follow your device, [facility] has decided, as a practice, to start following each patient four times a year with your home monitor. This means that you may experience a remote appointment that is close to an in-office appointment with your physician. Your insurance will apply at the same rate as other remote monitoring transmissions.  Dr Sallyanne Kuster recommends that you schedule a follow-up  appointment in 6 months with a pacemaker check. You will receive a reminder letter in the mail two months in advance. If you don't receive a letter, please call our office to schedule the follow-up appointment.  If you need a refill on your cardiac medications before your next appointment, please call your pharmacy.    Signed, [name], MD  11/08/2017 4:33 PM    [address], [place], [place]  [address] Phone: [phone]; Fax: [phone]

## 2017-11-06 ENCOUNTER — Encounter: Payer: Self-pay | Admitting: Family

## 2017-11-06 ENCOUNTER — Non-Acute Institutional Stay (SKILLED_NURSING_FACILITY): Payer: Medicare Other | Admitting: Family

## 2017-11-06 DIAGNOSIS — I5042 Chronic combined systolic (congestive) and diastolic (congestive) heart failure: Secondary | ICD-10-CM

## 2017-11-06 DIAGNOSIS — K219 Gastro-esophageal reflux disease without esophagitis: Secondary | ICD-10-CM | POA: Diagnosis not present

## 2017-11-06 DIAGNOSIS — E039 Hypothyroidism, unspecified: Secondary | ICD-10-CM

## 2017-11-06 DIAGNOSIS — G8929 Other chronic pain: Secondary | ICD-10-CM

## 2017-11-06 DIAGNOSIS — N4889 Other specified disorders of penis: Secondary | ICD-10-CM

## 2017-11-06 DIAGNOSIS — E782 Mixed hyperlipidemia: Secondary | ICD-10-CM

## 2017-11-06 NOTE — Progress Notes (Signed)
Location:  [facility] Room Number: 54 Place of Service:  SNF [phone]) Provider: Shelton Soler FNP-C   Wardell Honour, MD  Patient Care Team: Wardell Honour, MD as PCP - General (Family Medicine) [name], MD as PCP - Cardiology (Cardiology) [name], MD as Attending Physician (Cardiology) Vevelyn Royals, MD as Consulting Physician (Ophthalmology) Franchot Gallo, MD as Consulting Physician (Urology) Tanda Rockers, MD as Consulting Physician (Pulmonary Disease) Allyn Kenner, MD (Dermatology) Deliah Goody, PA-C as Physician Assistant (Physician Assistant) Alif Petrak, Nelda Bucks, NP as Nurse Practitioner (Family Medicine)  Extended Emergency Contact Information Primary Emergency Contact: Liendo,[name] Address: [address]          [place] Phone: [phone] Mobile Phone: [phone] Relation: Spouse Secondary Emergency Contact: [name],Sydnor  [place] Mobile Phone: [phone] Relation: Daughter  Code Status:  Full code Goals of care: Advanced Directive information Advanced Directives 11/06/2017  Does Patient Have a Medical Advance Directive? Yes  Type of Advance Directive Living will;Healthcare Power of Attorney  Does patient want to make changes to medical advance directive? No - Patient declined  Copy of [place] in Chart? Yes  Would patient like information on creating a medical advance directive? -  Pre-existing out of facility DNR order (yellow form or pink MOST form) -     Chief Complaint  Patient presents with  . Medical Management of Chronic Issues    Routine Visit    HPI:  Pt is a 84 y.o. male seen today [place] for medical management of chronic diseases.He has a medical history of Combined diastolic and systolic CHF,CAD,SSS,TIA,hypothyroidism,dizziness,Depression with anxiety,RLS among other conditions.He is seen in his room today with  wife and Nurse present at bedside.He denies any acute issues during visit.  Nurse states patient complains of pain on his penis requiring analgesic.Patient states no pain currently.He has seen by urology.No swelling,redness or open sore on penis.of note he has a chronic indwelling foley catheter. His weight has been stable.No recent fall episode.He continues to work with Therapy.He self propel on wheelchair and ambulate with walker with assistance. He was seen by cardiologist Dr.croitoru mihai ProAmatine decreased to 5 mg tablet three times daily and Notherra samples given for dizziness.Pacemaker also evaluated no atrial arrhythmia,flutter or Afib noted.cardiology recommended patient to continue on clopidogrel.Anticoagulant not indicated since patient has never had any abnormal arrhythmias.     Past Medical History:  Diagnosis Date  . Arthritis    "minor, back and sometimes knees" (12/15/2012)  . Bradycardia    AFib/SSS s/p St Jude PPM 04/12/2008  . CAD (coronary artery disease) 12/30/2014   CABG (LIMA-LAD, SVG-RCA, SVG-OM in 1996).  07/2009 BMS to SVG-RCA. Cath in 04/2010 with patent stents   . Cardiomyopathy, ischemic 08/25/2012  . CHF (congestive heart failure) ([place])   . Chronic knee pain 12/03/2014  . Combined congestive systolic and diastolic heart failure ([place]) 02/02/2015   Hx EF 41%. BNP 96.8 02/21/15 Torsemide 04/06/15 Na 142, K 4.6, Bun 16, creat 0.89 04/20/14 BNP 111.7, Na 142, K 4.6, Bun 16, creat 0.9   . Depression with anxiety 02/02/2015   02/21/15 Hgb A1c 5.8 03/10/15 MMSE 30/30   . Dizziness, after diuretic asscoiated with hypotension and responded to fluid bolus 06/05/2011   04/28/15 US carotid R+L normal bilateral arterial velocities.    Marland Kitchen Dyspnea 08/11/2014   Followed in Pulmonary clinic/ [facility]/ [place]  - 08/11/2014  Walked RA x  1 laps @ 185 ft each stopped due to fatigue/off balance/ slow pace/  no sob or desat  - PFT's  09/26/2014  FEV1 2.26 (85 % ) ratio 76  p no % improvement  from saba with DLCO  67 % corrects to 93 % for alv volume      Since prev study 08/04/13 minimal change lung vol or dlco    . Embolic cerebral infarction ([place]) 12/06/2015  . Exertional shortness of breath    "sometimes walking" (12/15/2012)  . GERD (gastroesophageal reflux disease)   . Gout 02/09/2015  . Heart murmur    "just told I had one today" (12/15/2012)  . Hiatal hernia   . Hyperlipidemia   . Hypertension   . Hypothyroid   . Influenza A 03/10/2016  . Insomnia   . Melanoma of back ([place]) 1976  . Myocardial infarction [facility]) 1996; 2011   "both silent" (12/15/2012)  . Nonrheumatic aortic valve stenosis   . Orthostatic hypotension   . Osteoporosis, senile   . Pacemaker   . RBBB   . Restless leg 02/02/2015  . Right leg weakness 12/06/2015  . [facility] spotted fever   . S/P CABG x 4   . Sick sinus syndrome ([place]) 01/31/2014  . Sustained ventricular tachycardia ([place]) 07/27/2014  . Urinary retention 12/30/2014   Past Surgical History:  Procedure Laterality Date  . CARDIAC CATHETERIZATION  04/2010   LIMA to LAD patent,SVG to OM patent,no in-stnet restenosis RCA  . CATARACT EXTRACTION W/ INTRAOCULAR LENS  IMPLANT, BILATERAL Bilateral 2012  . CORONARY ANGIOPLASTY WITH STENT PLACEMENT  07/2009   bare metal stent to SVG to the RCA  . CORONARY ARTERY BYPASS GRAFT  1996   LIMA to LAD,SVG to RCA & SVG to OM  . INSERT / REPLACE / REMOVE PACEMAKER  2010  . MELANOMA EXCISION  05/1974 X2   "taken off my back" (12/15/2012)  . NM MYOVIEW LTD  06/2011   low risk  . PPM GENERATOR CHANGEOUT N/A 01/22/2017   Procedure: PPM GENERATOR CHANGEOUT;  Surgeon: [name], MD;  Location: [facility];  Service: Cardiovascular;  Laterality: N/A;  . TONSILLECTOMY  1938  . TRANSURETHRAL RESECTION OF PROSTATE  1986  . US ECHOCARDIOGRAPHY  07/11/2009   EF 45-50%    Allergies  Allergen Reactions  . Altace [Ramipril] Cough  . Crestor [Rosuvastatin Calcium] Rash  . Penicillins Rash and Other  (See Comments)    Has patient had a PCN reaction causing immediate rash, facial/tongue/throat swelling, SOB or lightheadedness with hypotension: Yes Has patient had a PCN reaction causing severe rash involving mucus membranes or skin necrosis: No Has patient had a PCN reaction that required hospitalization No Has patient had a PCN reaction occurring within the last 10 years: No If all of the above answers are "NO", then may proceed with Cephalosporin use.   . Sulfa Antibiotics Rash  . Sulfamethoxazole-Trimethoprim Rash    Allergies as of 11/06/2017      Reactions   Altace [ramipril] Cough   Crestor [rosuvastatin Calcium] Rash   Penicillins Rash, Other (See Comments)   Has patient had a PCN reaction causing immediate rash, facial/tongue/throat swelling, SOB or lightheadedness with hypotension: Yes Has patient had a PCN reaction causing severe rash involving mucus membranes or skin necrosis: No Has patient had a PCN reaction that required hospitalization No Has patient had a PCN reaction occurring within the last 10 years: No If all of the above answers are "NO", then may  proceed with Cephalosporin use.   Sulfa Antibiotics Rash   Sulfamethoxazole-trimethoprim Rash      Medication List        Accurate as of 11/06/17 11:46 PM. Always use your most recent med list.          acetaminophen 500 MG tablet Commonly known as:  TYLENOL Take 500 mg by mouth 3 (three) times daily. Total daily Tylenol dose should not exceed 3000 mg   amiodarone 100 MG tablet Commonly known as:  PACERONE Take 1 tablet (100 mg total) by mouth daily.   aspirin EC 81 MG tablet Take 1 tablet (81 mg total) by mouth daily.   clopidogrel 75 MG tablet Commonly known as:  PLAVIX TAKE 1 TABLET ONCE DAILY.   DECUBI-VITE Caps Take 1 capsule by mouth daily.   Droxidopa 100 MG Caps Take 100 mg by mouth 3 (three) times daily.   feeding supplement (PRO-STAT SUGAR FREE 64) Liqd Take 30 mLs by mouth daily.     guaiFENesin 100 MG/5ML Soln Commonly known as:  ROBITUSSIN Take 5 mLs (100 mg total) by mouth every 4 (four) hours as needed for cough or to loosen phlegm.   HYDROcodone-acetaminophen 5-325 MG tablet Commonly known as:  NORCO/VICODIN Take 1 tablet by mouth 2 (two) times daily.   HYDROcodone-acetaminophen 5-325 MG tablet Commonly known as:  NORCO/VICODIN Take 1 tablet by mouth every 4 (four) hours as needed for severe pain.   levothyroxine 100 MCG tablet Commonly known as:  SYNTHROID, LEVOTHROID Take 100 mcg by mouth daily before breakfast.   Lidocaine 4 % Ptch Apply 1 patch topically daily. Left shoulder   meclizine 25 MG tablet Commonly known as:  ANTIVERT Take 1 tablet (25 mg total) by mouth 2 (two) times daily as needed for dizziness.   Melatonin 3 MG Tabs Take 3 mg by mouth at bedtime.   methocarbamol 500 MG tablet Commonly known as:  ROBAXIN Take 250 mg by mouth 2 (two) times daily.   midodrine 5 MG tablet Commonly known as:  PROAMATINE Take 1 tablet (5 mg total) by mouth 3 (three) times daily.   pantoprazole 40 MG tablet Commonly known as:  PROTONIX TAKE 1 TABLET BY MOUTH TWICE DAILY.   polyethylene glycol packet Commonly known as:  MIRALAX / GLYCOLAX Take 17 g by mouth daily. Hold for loose stool   pravastatin 40 MG tablet Commonly known as:  PRAVACHOL Take 1 tablet (40 mg total) by mouth at bedtime.   rOPINIRole 0.5 MG tablet Commonly known as:  REQUIP TAKE 1 TABLET 3 TIMES A DAY AS NEEDED.   sennosides-docusate sodium 8.6-50 MG tablet Commonly known as:  SENOKOT-S Take 2 tablets by mouth at bedtime.   torsemide 10 MG tablet Commonly known as:  DEMADEX Take 5 mg by mouth daily.   traZODone 50 MG tablet Commonly known as:  DESYREL Take 1 tablet (50 mg total) by mouth at bedtime.   Vitamin D3 5000 units Caps Take 5,000 Units by mouth every Monday.       Review of Systems  Constitutional: Negative for appetite change, chills, fatigue and  fever.  HENT: Negative for congestion, rhinorrhea, sinus pressure, sinus pain, sneezing and sore throat.   Eyes: Positive for visual disturbance. Negative for pain, discharge, redness and itching.       Wears eye glasses   Respiratory: Negative for cough, chest tightness, shortness of breath and wheezing.   Cardiovascular: Positive for leg swelling. Negative for chest pain and palpitations.  Gastrointestinal: Negative  for abdominal distention, abdominal pain, constipation, diarrhea, nausea and vomiting.  Endocrine: Negative for cold intolerance, heat intolerance, polydipsia, polyphagia and polyuria.  Genitourinary: Negative for flank pain and urgency.       Indwelling foley catheter   Musculoskeletal: Positive for gait problem.  Skin: Negative for color change, pallor and rash.  Neurological: Negative for facial asymmetry, light-headedness and headaches.       Chronic dizziness  Hematological: Does not bruise/bleed easily.  Psychiatric/Behavioral: Negative for agitation, confusion and sleep disturbance. The patient is not nervous/anxious.     Immunization History  Administered Date(s) Administered  . Influenza, High Dose Seasonal PF 11/01/2014, 11/13/2016  . Influenza-Unspecified 12/05/2013, 11/05/2014, 11/16/2015  . PPD Test 03/07/2014  . Pneumococcal Conjugate-13 10/05/2009  . Pneumococcal Polysaccharide-23 11/05/2005  . Pneumococcal-Unspecified 10/05/2009  . Tdap 08/02/2014  . Zoster 11/05/2005   Pertinent  Health Maintenance Due  Topic Date Due  . PNA vac Low Risk Adult (2 of 2 - PCV13) 10/06/2010  . INFLUENZA VACCINE  09/04/2017  . DEXA SCAN  12/14/2024   Fall Risk  10/09/2017 10/02/2016 10/30/2015 09/05/2015 02/02/2015  Falls in the past year? No No No No No  Risk for fall due to : - - Impaired balance/gait - -   Functional Status Survey:    Vitals:   11/06/17 1232  BP: (!) 156/80  Pulse: 75  Resp: 20  Temp: 97.8 F (36.6 C)  TempSrc: Oral  SpO2: 95%  Weight: 177 lb  3.2 oz (80.4 kg)  Height: 5\' 10"  (1.778 m)   Body mass index is 25.43 kg/m. Physical Exam  Constitutional: He is oriented to person, place, and time.  Elderly in no acute distress   HENT:  Head: Normocephalic.  Right Ear: External ear normal.  Left Ear: External ear normal.  Mouth/Throat: Oropharynx is clear and moist. No oropharyngeal exudate.  Eyes: Pupils are equal, round, and reactive to light. Conjunctivae and EOM are normal. Right eye exhibits no discharge. Left eye exhibits no discharge. No scleral icterus.  Neck: Normal range of motion. No JVD present. No thyromegaly present.  Cardiovascular: Intact distal pulses. Exam reveals no gallop and no friction rub.  Murmur heard. Pulmonary/Chest: Effort normal and breath sounds normal. No respiratory distress. He has no wheezes.  Abdominal: Soft. Bowel sounds are normal. He exhibits no distension and no mass. There is no tenderness. There is no rebound and no guarding.  Genitourinary:  Genitourinary Comments: Indwelling foley catheter draining adequate amounts of yellow  urine   Musculoskeletal:  Moves x 4 extremities .unsteady gait self propels on wheelchair and ambulates with walker with assistance.   Lymphadenopathy:    He has no cervical adenopathy.  Neurological: He is oriented to person, place, and time. Gait abnormal.  Skin: Skin is warm and dry. No rash noted. No erythema. No pallor.  Psychiatric: He has a normal mood and affect. His speech is normal and behavior is normal. Judgment and thought content normal.  Nursing note and vitals reviewed.   Labs reviewed: Recent Labs    08/26/17 0426 08/27/17 0423 08/29/17 1022 08/31/17 0342 09/04/17 1054 09/08/17 09/11/17 09/12/17 10/09/17  NA 139 142 140 139 140 137 136 136* 139  K 4.2 3.8 3.8 3.7 3.4* 4.4 4.2 4.2 4.2  CL 107 108 106 106 105  --   --   --   --   CO2 25 26 25 25 26   --   --   --   --   GLUCOSE 104* 109*  113* 120* 94  --   --   --   --   BUN 13 18 15 17 15 14  17   --  16  CREATININE 0.49* 0.57* 0.55* 0.51* 0.51* 0.61 0.59 0.6 0.64  CALCIUM 8.6* 8.5* 8.8* 8.3* 8.0* 8.6 8.2  --  8.7  MG 2.2 2.1  --   --   --   --   --   --   --   PHOS 3.4  --   --   --   --   --   --   --   --    Recent Labs    09/04/17 1054 09/11/17 09/12/17 10/09/17  AST 33 20 20 13   ALT 45* 25 25 8   ALKPHOS 61 76 76 79  BILITOT 0.3 0.3  --  0.4  PROT 5.8* 5.8  --  6.3  ALBUMIN 2.4* 3.0  --  3.6   Recent Labs    04/07/17 2358  08/27/17 0423  09/04/17 1054  09/08/17 09/11/17 09/12/17 10/09/17 10/26/17  WBC 8.6   < > 8.9   < > 12.2*  --  9.5 7.6 7.6 7.4 9.4  NEUTROABS 6.2  --  6.5  --  8.9*  --   --   --   --   --   --   HGB 13.7   < > 12.1*   < > 12.8*   < > 12.9 12.8 12.8* 12.8 13.4*  HCT 41.1   < > 36.9*   < > 39.2   < > 38.9 38.8 39* 37.9 39*  MCV 97.4   < > 97.1   < > 97.3  --  93.5 94.6  --  92.7  --   PLT 230   < > 243   < > 354  --   --   --  300  --  297   < > = values in this interval not displayed.   Lab Results  Component Value Date   TSH 1.125 08/26/2017   Lab Results  Component Value Date   HGBA1C 5.6 12/07/2015   Lab Results  Component Value Date   CHOL 103 05/05/2017   HDL 39 (L) 05/05/2017   LDLCALC 43 05/05/2017   TRIG 129 05/05/2017   CHOLHDL 2.6 05/05/2017    Significant Diagnostic Results in last 30 days:  No results found.  Assessment/Plan 1. Hypothyroidism, unspecified type Lab Results  Component Value Date   TSH 1.125 08/26/2017  Continue on levothyroxine 100 mcg tablet daily.check TSH level 11/10/2017  2. Mixed hyperlipidemia LDL at goal.continue on Pravastatin 40 mg tablet daily.check fasting lipid panel 11/10/2017   3. Chronic combined systolic and diastolic congestive heart failure (HCC) Stable.No shortness of breath,wheezing,cough or rales.bilateral lower extremities knee high ted hose in place.continue on torsemide 5 mg tablet daily.    4. Gastroesophageal reflux disease without esophagitis Asymptomatic.continue on  Protonix 40 mg tablet daily.  5. Chronic pain in penis No swelling,redness,drainage or tenderness.Pain seems to be intermittent unclear etiology.He has been seen by urology.Foley catheter well secure on patient's leg.continue to manage pain as needed.  Family/ staff Communication: Reviewed plan of care with patient and facility Nurse supervisor   Labs/tests ordered: None   Adam Hughs, NP

## 2017-11-08 DIAGNOSIS — I25810 Atherosclerosis of coronary artery bypass graft(s) without angina pectoris: Secondary | ICD-10-CM | POA: Insufficient documentation

## 2017-11-11 ENCOUNTER — Other Ambulatory Visit: Payer: Self-pay

## 2017-11-11 ENCOUNTER — Emergency Department (HOSPITAL_COMMUNITY): Payer: Medicare Other

## 2017-11-11 ENCOUNTER — Emergency Department (HOSPITAL_COMMUNITY)
Admission: EM | Admit: 2017-11-11 | Discharge: 2017-11-12 | Disposition: A | Discharge status: Home / Self Care | Payer: Medicare Other | Attending: Emergency Medicine | Admitting: Emergency Medicine

## 2017-11-11 ENCOUNTER — Encounter (HOSPITAL_COMMUNITY): Payer: Self-pay | Admitting: Emergency Medicine

## 2017-11-11 DIAGNOSIS — I25810 Atherosclerosis of coronary artery bypass graft(s) without angina pectoris: Secondary | ICD-10-CM

## 2017-11-11 DIAGNOSIS — I472 Ventricular tachycardia: Secondary | ICD-10-CM

## 2017-11-11 DIAGNOSIS — R079 Chest pain, unspecified: Secondary | ICD-10-CM

## 2017-11-11 DIAGNOSIS — R059 Cough, unspecified: Secondary | ICD-10-CM

## 2017-11-11 DIAGNOSIS — J189 Pneumonia, unspecified organism: Secondary | ICD-10-CM

## 2017-11-11 DIAGNOSIS — I110 Hypertensive heart disease with heart failure: Secondary | ICD-10-CM | POA: Insufficient documentation

## 2017-11-11 DIAGNOSIS — E039 Hypothyroidism, unspecified: Secondary | ICD-10-CM | POA: Insufficient documentation

## 2017-11-11 DIAGNOSIS — R0789 Other chest pain: Secondary | ICD-10-CM | POA: Insufficient documentation

## 2017-11-11 DIAGNOSIS — Z950 Presence of cardiac pacemaker: Secondary | ICD-10-CM

## 2017-11-11 DIAGNOSIS — I495 Sick sinus syndrome: Secondary | ICD-10-CM | POA: Diagnosis not present

## 2017-11-11 DIAGNOSIS — Z79899 Other long term (current) drug therapy: Secondary | ICD-10-CM | POA: Diagnosis not present

## 2017-11-11 DIAGNOSIS — I5043 Acute on chronic combined systolic (congestive) and diastolic (congestive) heart failure: Secondary | ICD-10-CM | POA: Diagnosis not present

## 2017-11-11 DIAGNOSIS — R072 Precordial pain: Secondary | ICD-10-CM | POA: Diagnosis not present

## 2017-11-11 DIAGNOSIS — R002 Palpitations: Secondary | ICD-10-CM | POA: Diagnosis not present

## 2017-11-11 DIAGNOSIS — Z7982 Long term (current) use of aspirin: Secondary | ICD-10-CM | POA: Diagnosis not present

## 2017-11-11 DIAGNOSIS — I951 Orthostatic hypotension: Secondary | ICD-10-CM

## 2017-11-11 DIAGNOSIS — R1012 Left upper quadrant pain: Secondary | ICD-10-CM | POA: Diagnosis not present

## 2017-11-11 DIAGNOSIS — Z951 Presence of aortocoronary bypass graft: Secondary | ICD-10-CM | POA: Insufficient documentation

## 2017-11-11 DIAGNOSIS — R0602 Shortness of breath: Secondary | ICD-10-CM | POA: Diagnosis not present

## 2017-11-11 DIAGNOSIS — R05 Cough: Secondary | ICD-10-CM | POA: Insufficient documentation

## 2017-11-11 LAB — LIPASE, BLOOD: Lipase: 21 U/L (ref 11–51)

## 2017-11-11 LAB — HEPATIC FUNCTION PANEL
ALT: 12 U/L (ref 0–44)
AST: 17 U/L (ref 15–41)
Albumin: 2.8 g/dL — ABNORMAL LOW (ref 3.5–5.0)
Alkaline Phosphatase: 75 U/L (ref 38–126)
Bilirubin, Direct: <0.1 mg/dL (ref 0.0–0.2)
Total Bilirubin: 0.3 mg/dL (ref 0.3–1.2)
Total Protein: 6.3 g/dL — ABNORMAL LOW (ref 6.5–8.1)

## 2017-11-11 LAB — BASIC METABOLIC PANEL
Anion gap: 9 (ref 5–15)
BUN: 14 mg/dL (ref 8–23)
CO2: 25 mmol/L (ref 22–32)
Calcium: 8.8 mg/dL — ABNORMAL LOW (ref 8.9–10.3)
Chloride: 103 mmol/L (ref 98–111)
Creatinine, Ser: 0.71 mg/dL (ref 0.61–1.24)
GFR calc Af Amer: >60 mL/min (ref >60)
GFR calc non Af Amer: >60 mL/min (ref >60)
Glucose, Bld: 105 mg/dL — ABNORMAL HIGH (ref 70–99)
Potassium: 4.2 mmol/L (ref 3.5–5.1)
Sodium: 137 mmol/L (ref 135–145)

## 2017-11-11 LAB — CBC
HCT: 40 % (ref 39.0–52.0)
Hemoglobin: 12.4 g/dL — ABNORMAL LOW (ref 13.0–17.0)
MCH: 30.3 pg (ref 26.0–34.0)
MCHC: 31 g/dL (ref 30.0–36.0)
MCV: 97.8 fL (ref 80.0–100.0)
Platelets: 300 10*3/uL (ref 150–400)
RBC: 4.09 MIL/uL — ABNORMAL LOW (ref 4.22–5.81)
RDW: 15.4 % (ref 11.5–15.5)
WBC: 10.3 10*3/uL (ref 4.0–10.5)
nRBC: 0 % (ref 0.0–0.2)

## 2017-11-11 LAB — I-STAT TROPONIN, ED
Troponin i, poc: 0.04 ng/mL (ref 0.00–0.08)
Troponin i, poc: 0.01 ng/mL (ref 0.00–0.08)

## 2017-11-11 LAB — BRAIN NATRIURETIC PEPTIDE: B Natriuretic Peptide: 176.8 pg/mL — ABNORMAL HIGH (ref 0.0–100.0)

## 2017-11-11 MED ORDER — ALUM & MAG HYDROXIDE-SIMETH 200-200-20 MG/5ML PO SUSP
15.0000 mL | Freq: Once | ORAL | Status: AC
  Administered 2017-11-11: 15 mL via ORAL
  Filled 2017-11-11: qty 30

## 2017-11-11 MED ORDER — DOXYCYCLINE HYCLATE 100 MG PO CAPS: 100.0000 mg | ORAL_CAPSULE | Freq: Two times a day (BID) | ORAL | 0 refills | Status: DC

## 2017-11-11 MED ORDER — IOHEXOL 300 MG/ML  SOLN
100.0000 mL | Freq: Once | INTRAMUSCULAR | Status: AC | PRN
  Administered 2017-11-11: 100 mL via INTRAVENOUS

## 2017-11-11 MED ORDER — DOXYCYCLINE HYCLATE 100 MG PO TABS
100.0000 mg | ORAL_TABLET | Freq: Once | ORAL | Status: AC
  Administered 2017-11-11: 100 mg via ORAL
  Filled 2017-11-11: qty 1

## 2017-11-11 MED ORDER — ROPINIROLE HCL 0.5 MG PO TABS
0.5000 mg | ORAL_TABLET | Freq: Once | ORAL | Status: AC
  Administered 2017-11-11: 0.5 mg via ORAL
  Filled 2017-11-11: qty 1

## 2017-11-11 NOTE — Consult Note (Signed)
Cardiology Consultation:   Patient ID: AWAB ABEBE MRN: [id number]; DOB: 05/02/1926  Admit date: 11/11/2017 Date of Consult: 11/11/2017  Primary Care Provider: Wardell Honour, MD Primary Cardiologist: [name], MD  Primary Electrophysiologist:  None    Patient Profile:   Adam [name] is a 84 y.o. male with a hx of previous "silent myocardial infarction" x2 in the setting of coronary artery disease and previous bypass surgery (1996), previous stent (2011 bare-metal stent to SVG-RCA), prior sustained ventricular tachycardia, chronic combined systolic and diastolic heart failure, history of ischemic stroke, moderate aortic stenosis, sinus node dysfunction with dual-chamber permanent pacemaker ([place]), severe disabling orthostatic hypotension, who is being seen today for the evaluation of chest pain at rest at the request of Dr. Venora Maples.  History of Present Illness:   Adam [name] was in the middle of eating meatloaf at lunch today when he experienced sudden pounding/colicky chest discomfort.  With his previous myocardial infarction he did not have pain, but presented with arrhythmia.  Symptoms were relieved after he was administered sublingual nitroglycerin and aspirin.  He is currently pain-free.  He did not have dyspnea or near syncope.  He has not felt the current type of pain ever in the past.  He was recently seen in the office on November 03, 2017.  He has not had any intercurrent cardiac problems, but has had poor appetite and cough productive of yellow sputum over the last 3 days or so.  He has not had fever or chills.  He denies pleuritic chest pain. He denies nausea, vomiting, abdominal pain or diarrhea, recent weight changes, bleeding problems, recent falls or injuries.  He has complaints of restless leg syndrome which he finds very annoying, but no other focal neurological complaints.  His initial ECG shows atrial paced, ventricular sensed rhythm with old right bundle  branch block, unchanged from previous tracings.  Interrogation of his pacemaker does not show any recent episodes of atrial or ventricular arrhythmia.  He has virtually 100% atrial pacing and relatively frequent ventricular pacing.  Device function is normal.  There has been no recent ventricular tachycardia.    His chest x-ray shows a large hiatal hernia but no acute pulmonary or cardiac abnormalities.  Point-of-care troponin is normal.  Note that his chart states that he has had previous atrial fibrillation, but this is an error.  Past Medical History:  Diagnosis Date  . Arthritis    "minor, back and sometimes knees" (12/15/2012)  . Bradycardia    AFib/SSS s/p St Jude PPM 04/12/2008  . CAD (coronary artery disease) 12/30/2014   CABG (LIMA-LAD, SVG-RCA, SVG-OM in 1996).  07/2009 BMS to SVG-RCA. Cath in 04/2010 with patent stents   . Cardiomyopathy, ischemic 08/25/2012  . CHF (congestive heart failure) ([place])   . Chronic knee pain 12/03/2014  . Combined congestive systolic and diastolic heart failure ([place]) 02/02/2015   Hx EF 41%. BNP 96.8 02/21/15 Torsemide 04/06/15 Na 142, K 4.6, Bun 16, creat 0.89 04/20/14 BNP 111.7, Na 142, K 4.6, Bun 16, creat 0.9   . Depression with anxiety 02/02/2015   02/21/15 Hgb A1c 5.8 03/10/15 MMSE 30/30   . Dizziness, after diuretic asscoiated with hypotension and responded to fluid bolus 06/05/2011   04/28/15 US carotid R+L normal bilateral arterial velocities.    Marland Kitchen Dyspnea 08/11/2014   Followed in Pulmonary clinic/ [facility]/ [place]  - 08/11/2014  Walked RA x 1 laps @ 185 ft each stopped due to fatigue/off balance/ slow pace/  no  sob or desat  - PFT's  09/26/2014  FEV1 2.26 (85 % ) ratio 76  p no % improvement from saba with DLCO  67 % corrects to 93 % for alv volume      Since prev study 08/04/13 minimal change lung vol or dlco    . Embolic cerebral infarction ([place]) 12/06/2015  . Exertional shortness of breath    "sometimes walking" (12/15/2012)  . GERD (gastroesophageal  reflux disease)   . Gout 02/09/2015  . Heart murmur    "just told I had one today" (12/15/2012)  . Hiatal hernia   . Hyperlipidemia   . Hypertension   . Hypothyroid   . Influenza A 03/10/2016  . Insomnia   . Melanoma of back ([place]) 1976  . Myocardial infarction [facility]) 1996; 2011   "both silent" (12/15/2012)  . Nonrheumatic aortic valve stenosis   . Orthostatic hypotension   . Osteoporosis, senile   . Pacemaker   . RBBB   . Restless leg 02/02/2015  . Right leg weakness 12/06/2015  . [facility] spotted fever   . S/P CABG x 4   . Sick sinus syndrome ([place]) 01/31/2014  . Sustained ventricular tachycardia ([place]) 07/27/2014  . Urinary retention 12/30/2014    Past Surgical History:  Procedure Laterality Date  . CARDIAC CATHETERIZATION  04/2010   LIMA to LAD patent,SVG to OM patent,no in-stnet restenosis RCA  . CATARACT EXTRACTION W/ INTRAOCULAR LENS  IMPLANT, BILATERAL Bilateral 2012  . CORONARY ANGIOPLASTY WITH STENT PLACEMENT  07/2009   bare metal stent to SVG to the RCA  . CORONARY ARTERY BYPASS GRAFT  1996   LIMA to LAD,SVG to RCA & SVG to OM  . INSERT / REPLACE / REMOVE PACEMAKER  2010  . MELANOMA EXCISION  05/1974 X2   "taken off my back" (12/15/2012)  . NM MYOVIEW LTD  06/2011   low risk  . PPM GENERATOR CHANGEOUT N/A 01/22/2017   Procedure: PPM GENERATOR CHANGEOUT;  Surgeon: [name], MD;  Location: [facility];  Service: Cardiovascular;  Laterality: N/A;  . TONSILLECTOMY  1938  . TRANSURETHRAL RESECTION OF PROSTATE  1986  . US ECHOCARDIOGRAPHY  07/11/2009   EF 45-50%     Home Medications:  Prior to Admission medications   Medication Sig Start Date End Date Taking? Authorizing Provider  acetaminophen (TYLENOL) 500 MG tablet Take 500 mg by mouth 3 (three) times daily. Total daily Tylenol dose should not exceed 3000 mg    [provider]  Amino Acids-Protein Hydrolys (FEEDING SUPPLEMENT, PRO-STAT SUGAR FREE 64,) LIQD Take 30 mLs by mouth daily.     [provider]  amiodarone (PACERONE) 100 MG tablet Take 1 tablet (100 mg total) by mouth daily. 01/16/17   Kasidee Voisin, MD  aspirin EC 81 MG tablet Take 1 tablet (81 mg total) by mouth daily. 01/12/16   Reyne Dumas, MD  Cholecalciferol (VITAMIN D3) 5000 UNITS CAPS Take 5,000 Units by mouth every Monday.     [provider]  clopidogrel (PLAVIX) 75 MG tablet TAKE 1 TABLET ONCE DAILY. 06/13/17   Blanchie Serve, MD  Droxidopa 100 MG CAPS Take 100 mg by mouth 3 (three) times daily. 08/12/17   Vianey Caniglia, MD  guaiFENesin (ROBITUSSIN) 100 MG/5ML SOLN Take 5 mLs (100 mg total) by mouth every 4 (four) hours as needed for cough or to loosen phlegm. 09/04/17   Lavina Hamman, MD  HYDROcodone-acetaminophen (NORCO/VICODIN) 5-325 MG tablet Take 1 tablet by mouth every 4 (four)  hours as needed for severe pain. 09/04/17   Lavina Hamman, MD  HYDROcodone-acetaminophen (NORCO/VICODIN) 5-325 MG tablet Take 1 tablet by mouth 2 (two) times daily.    [provider]  levothyroxine (SYNTHROID, LEVOTHROID) 100 MCG tablet Take 100 mcg by mouth daily before breakfast.    [provider]  Lidocaine 4 % PTCH Apply 1 patch topically daily. Left shoulder    [provider]  meclizine (ANTIVERT) 25 MG tablet Take 1 tablet (25 mg total) by mouth 2 (two) times daily as needed for dizziness. 09/04/17   Lavina Hamman, MD  Melatonin 3 MG TABS Take 3 mg by mouth at bedtime.    [provider]  methocarbamol (ROBAXIN) 500 MG tablet Take 250 mg by mouth 2 (two) times daily.     [provider]  midodrine (PROAMATINE) 5 MG tablet Take 1 tablet (5 mg total) by mouth 3 (three) times daily. 11/03/17   Eula Jaster, MD  Multiple Vitamins-Minerals (DECUBI-VITE) CAPS Take 1 capsule by mouth daily.    [provider]  pantoprazole (PROTONIX) 40 MG tablet TAKE 1 TABLET BY MOUTH TWICE DAILY. 02/28/17   Erlene Quan, PA-C  polyethylene glycol (MIRALAX / GLYCOLAX)  packet Take 17 g by mouth daily. Hold for loose stool    [provider]  pravastatin (PRAVACHOL) 40 MG tablet Take 1 tablet (40 mg total) by mouth at bedtime. 10/21/16   Blanchie Serve, MD  rOPINIRole (REQUIP) 0.5 MG tablet TAKE 1 TABLET 3 TIMES A DAY AS NEEDED. 07/08/17   Blanchie Serve, MD  sennosides-docusate sodium (SENOKOT-S) 8.6-50 MG tablet Take 2 tablets by mouth at bedtime.     [provider]  torsemide (DEMADEX) 10 MG tablet Take 5 mg by mouth daily.     [provider]  traZODone (DESYREL) 50 MG tablet Take 1 tablet (50 mg total) by mouth at bedtime. 05/19/17   Blanchie Serve, MD    Inpatient Medications: Scheduled Meds: . rOPINIRole  0.5 mg Oral Once   Continuous Infusions:  PRN Meds:   Allergies:    Allergies  Allergen Reactions  . Altace [Ramipril] Cough  . Crestor [Rosuvastatin Calcium] Rash  . Penicillins Rash and Other (See Comments)    Has patient had a PCN reaction causing immediate rash, facial/tongue/throat swelling, SOB or lightheadedness with hypotension: Yes Has patient had a PCN reaction causing severe rash involving mucus membranes or skin necrosis: No Has patient had a PCN reaction that required hospitalization No Has patient had a PCN reaction occurring within the last 10 years: No If all of the above answers are "NO", then may proceed with Cephalosporin use.   . Sulfa Antibiotics Rash  . Sulfamethoxazole-Trimethoprim Rash    Social History:   Social History   Socioeconomic History  . Marital status: Married    Spouse name: Not on file  . Number of children: 2  . Years of education: Masters  . Highest education level: Not on file  Occupational History  . Occupation: Retired Company secretary -Pensions consultant  Social Needs  . Financial resource strain: Not hard at all  . Food insecurity:    Worry: Never true    Inability: Never true  . Transportation needs:    Medical: No    Non-medical: No  Tobacco Use  . Smoking  status: Never Smoker  . Smokeless tobacco: Never Used  Substance and Sexual Activity  . Alcohol use: No    Alcohol/week: 0.0 standard drinks  . Drug use:  No  . Sexual activity: Never  Lifestyle  . Physical activity:    Days per week: 7 days    Minutes per session: 10 min  . Stress: Not at all  Relationships  . Social connections:    Talks on phone: More than three times a week    Gets together: More than three times a week    Attends religious service: Never    Active member of club or organization: No    Attends meetings of clubs or organizations: Never    Relationship status: Married  . Intimate partner violence:    Fear of current or ex partner: No    Emotionally abused: No    Physically abused: No    Forced sexual activity: No  Other Topics Concern  . Not on file  Social History Narrative   Lives at [place] to [place] 01/09/15   Married - Violet   Never smoked   Alcohol none   Previously employed as Scientist, forensic for [employer].         Diet:Low sodium   Do you drink/eat things with caffeine? No   Marital status: Married                              What year were you married?1950   Do you live in a house, apartment, assisted living, condo, trailer, etc)?    Is it one or more stories? 1   How many persons live in your home? 2   Do you have any pets in your home? No   Current or past profession: Minister, [employer]   Do you exercise?     Very Little                                                 Type & how often:    Do you have a living will?  Yes   Do you have a DNR Form? Yes   Do you have a POA/HPOA forms? Yes    Family History:    Family History  Problem Relation Age of Onset  . Coronary artery disease Mother   . Diabetes Mother   . Heart disease Mother   . Coronary artery disease Father   . Diabetes Father   . Lung cancer Father   . Heart disease Father   . Anuerysm Son   . Heart disease Brother      ROS:  Please  see the history of present illness.   All other ROS reviewed and negative.     Physical Exam/Data:   Vitals:   11/11/17 1345 11/11/17 1346 11/11/17 1347  BP: 127/71 127/71   Pulse: 70 72   Resp: (!) 21 16   Temp:  99.1 F (37.3 C)   TempSrc:  Oral   SpO2: 95% 97%   Weight:   77.1 kg  Height:   5\' 10"  (1.778 m)   No intake or output data in the 24 hours ending 11/11/17 1521 Filed Weights   11/11/17 1347  Weight: 77.1 kg   Body mass index is 24.39 kg/m.  General:  Well nourished, well developed, in no acute distress, he looks quite comfortable HEENT: normal Lymph: no adenopathy Neck: no JVD Endocrine:  No thryomegaly Vascular: No carotid bruits; FA  pulses 2+ bilaterally without bruits  Cardiac:  normal S1, widely split S2; RRR; 3/6 mid peaking systolic ejection murmur heard throughout the precordium but loudest at the right upper sternal border no diastolic murmur ; the pacemaker site is very prominent due to surgery related fat atrophy, but otherwise appears healthy Lungs:  clear to auscultation bilaterally, no wheezing, rhonchi or rales  Abd: soft, nontender, no hepatomegaly  Ext: no edema Musculoskeletal:  No deformities, BUE and BLE strength normal and equal Skin: warm and dry  Neuro:  CNs 2-12 intact, no focal abnormalities noted Psych:  Normal affect   EKG:  The EKG was personally reviewed and demonstrates: Atrial paced, ventricular sensed Rhythm with old right bundle branch block and left anterior fascicular block, no acute repolarization of normalities Telemetry:  Telemetry was personally reviewed and demonstrates: Atrial paced, ventricular sensed rhythm  Relevant CV Studies: Echo September 02, 2017  - Left ventricle: The cavity size was normal. Wall thickness was   increased in a pattern of severe LVH. Systolic function was   moderately reduced. The estimated ejection fraction was in the   range of 35% to 40%. Akinesis of the basalinferior myocardium.   Doppler  parameters are consistent with a reversible restrictive   pattern, indicative of decreased left ventricular diastolic   compliance and/or increased left atrial pressure (grade 3   diastolic dysfunction). - Aortic valve: There was moderate stenosis. There was mild   regurgitation. Valve area (VTI): 1.17 cm^2. Valve area (Vmax):   1.22 cm^2. Valve area (Vmean): 1.34 cm^2.  Laboratory Data:  ChemistryNo results for input(s): NA, K, CL, CO2, GLUCOSE, BUN, CREATININE, CALCIUM, GFRNONAA, GFRAA, ANIONGAP in the last 168 hours.  No results for input(s): PROT, ALBUMIN, AST, ALT, ALKPHOS, BILITOT in the last 168 hours. Hematology Recent Labs  Lab 11/11/17 1350  WBC 10.3  RBC 4.09*  HGB 12.4*  HCT 40.0  MCV 97.8  MCH 30.3  MCHC 31.0  RDW 15.4  PLT 300   Cardiac EnzymesNo results for input(s): TROPONINI in the last 168 hours.  Recent Labs  Lab 11/11/17 1506  TROPIPOC 0.04    BNPNo results for input(s): BNP, PROBNP in the last 168 hours.  DDimer No results for input(s): DDIMER in the last 168 hours.  Radiology/Studies:  Dg Chest 2 View  Result Date: 11/11/2017 CLINICAL DATA:  Chest pain EXAM: CHEST - 2 VIEW COMPARISON:  08/25/2017 FINDINGS: Cardiac enlargement. Postop CABG and dual lead pacemaker. Negative for heart failure. Large hiatal hernia.  No acute skeletal abnormality. IMPRESSION: No acute abnormality.  Large hiatal hernia Electronically Signed   By: Franchot Gallo M.D.   On: 11/11/2017 14:36    Assessment and Plan:   1. Chest pain: Circumstances and pattern of pain much more consistent with a GI etiology, possible esophageal spasm in view of the prompt response to sublingual nitroglycerin.   2. CAD s/p CABG:Despite the fact that he has an extensive history of coronary artery disease and has had previous myocardial infarction he has never experienced angina, but the acute ischemic events presented with arrhythmia.  No arrhythmias detected on his pacemaker check today. Has an  extensive scar, but no reversible ischemia on his most recent nuclear study from November 2014. He has occluded native arteries and is graft dependent (LIMA to LAD, SVG to RCA, SVG to OM; status post stent in SVG to RCA 2011, patent by cath March 2012).  3. CHF: Clinically euvolemic.  Appears to have good functional status.  Activity  limited primarily by severe orthostatic hypotension, not by heart failure.  Avoid diuretics due to orthostatic hypotension.  In the past had heart failure exacerbation when he was treated with fludrocortisone. 4. VT: He is on antiarrhythmics for his history of VT and has never had atrial fibrillation.  No recent ventricular tachycardia has been detected, including on device check performed today. 5. SSS: He is "atrially dependent" on his pacemaker but usually has intact AV conduction. 6. PM: Normal device function on comprehensive check today. 7. Orthostatic hypotension: Better compensated recently since he started treatment with [name].  If his cardiac enzymes remain normal (on a sample drawn at least 4 hours after the onset of symptoms), I think it is highly unlikely that he had an acute coronary event and I would not recommend additional inpatient cardiac evaluation.      For questions or updates, please contact [place] Please consult [email or web address] for contact info under     Signed, [name], MD  11/11/2017 3:21 PM

## 2017-11-11 NOTE — ED Notes (Signed)
Pt returned from CT °

## 2017-11-11 NOTE — Progress Notes (Signed)
Error, duplicate

## 2017-11-11 NOTE — ED Notes (Signed)
[facility] contacted for tx back to [facility]; apt [phone]

## 2017-11-11 NOTE — ED Triage Notes (Signed)
Pt arrives via EMS from [facility] Room 29. Just got done eating lunch, began having pounding CP. Had 1 nitro and 324mg  ASA with resolution of pain. Reports feeling lightheaded and loss of appetite the last 3 days. Denies SOB, reports strong productive yellow cough. Denies recent fever. Pt alert, oriented x4, pt uses walker or wheelchair to ambulate. VSS.

## 2017-11-11 NOTE — ED Notes (Signed)
Pt given turkey sandwich and water

## 2017-11-11 NOTE — ED Provider Notes (Signed)
Patient care assumed at 1600.  Patient here for evaluation of chest pain, left upper quadrant pain. He is been evaluated by cardiology in the emergency department. EKG without acute ischemic changes in troponin turn negative times two. Given his abdominal tenderness on examination and CT chest abdomen and pelvis was obtained. Imaging does demonstrate pneumonia, chronic hiatal hernia. On repeat assessment he is asymptomatic. Will treat with antibiotics for possible pneumonia. Plan to DC home with close outpatient follow-up as well as return precautions.   Quintella Reichert, MD 11/12/17 [phone]

## 2017-11-11 NOTE — ED Notes (Signed)
Patient transported to CT 

## 2017-11-11 NOTE — ED Provider Notes (Signed)
[facility] EMERGENCY DEPARTMENT Provider Note   CSN: [id number] Arrival date & time: 11/11/17  1334     History   Chief Complaint Chief Complaint  Patient presents with  . Chest Pain    HPI Adam [name] is a 84 y.o. male.  HPI 84 year old male past medical history significant for CHF (last EF was 35 to 40%),, CAD status post PCI and CABG, sick sinus syndrome with pacemaker in place, hyperlipidemia, hypertension presents to the emergency department today for evaluation of chest pain.  Patient comes by EMS from assisted living where he was eating lunch at approximately 12:00 this afternoon and began having a sharp pounding chest pain.  The patient had 1 nitro and 324 mg of aspirin with complete resolution of his pain and denies pain at this time.  Does report a productive yellow cough.  Denies any recent fevers. Wife at bedside states that patient would not typically have chest pain has "silent heart attacks".  Patient reports some mild shortness of breath but denies any near syncope.  He has never felt this current type of pain before.  Patient reports some mild nausea but denies any emesis.  Denies any diaphoresis.  Denies any lower examinee swelling.  Denies any pleuritic chest pain or exertional chest pain.  Denies any lightheadedness or dizziness.  Nothing makes symptoms worse.  He continues to be asymptomatic  while in the ED.  He does report a productive cough of yellow-green sputum.  No known fevers or chills.  Denies any significant shortness of breath at this time.  Pt denies any fever, chill, ha, vision changes, lightheadedness, dizziness, congestion, neck pain, abd pain, urinary symptoms, change in bowel habits, melena, hematochezia, lower extremity paresthesias.  Past Medical History:  Diagnosis Date  . Arthritis    "minor, back and sometimes knees" (12/15/2012)  . Bradycardia    AFib/SSS s/p St Jude PPM 04/12/2008  . CAD (coronary artery disease) 12/30/2014    CABG (LIMA-LAD, SVG-RCA, SVG-OM in 1996).  07/2009 BMS to SVG-RCA. Cath in 04/2010 with patent stents   . Cardiomyopathy, ischemic 08/25/2012  . CHF (congestive heart failure) ([place])   . Chronic knee pain 12/03/2014  . Combined congestive systolic and diastolic heart failure ([place]) 02/02/2015   Hx EF 41%. BNP 96.8 02/21/15 Torsemide 04/06/15 Na 142, K 4.6, Bun 16, creat 0.89 04/20/14 BNP 111.7, Na 142, K 4.6, Bun 16, creat 0.9   . Depression with anxiety 02/02/2015   02/21/15 Hgb A1c 5.8 03/10/15 MMSE 30/30   . Dizziness, after diuretic asscoiated with hypotension and responded to fluid bolus 06/05/2011   04/28/15 US carotid R+L normal bilateral arterial velocities.    Marland Kitchen Dyspnea 08/11/2014   Followed in Pulmonary clinic/ [facility]/ [place]  - 08/11/2014  Walked RA x 1 laps @ 185 ft each stopped due to fatigue/off balance/ slow pace/  no sob or desat  - PFT's  09/26/2014  FEV1 2.26 (85 % ) ratio 76  p no % improvement from saba with DLCO  67 % corrects to 93 % for alv volume      Since prev study 08/04/13 minimal change lung vol or dlco    . Embolic cerebral infarction ([place]) 12/06/2015  . Exertional shortness of breath    "sometimes walking" (12/15/2012)  . GERD (gastroesophageal reflux disease)   . Gout 02/09/2015  . Heart murmur    "just told I had one today" (12/15/2012)  . Hiatal hernia   . Hyperlipidemia   .  Hypertension   . Hypothyroid   . Influenza A 03/10/2016  . Insomnia   . Melanoma of back ([address]) 1976  . Myocardial infarction [facility]) 1996; 2011   "both silent" (12/15/2012)  . Nonrheumatic aortic valve stenosis   . Orthostatic hypotension   . Osteoporosis, senile   . Pacemaker   . RBBB   . Restless leg 02/02/2015  . Right leg weakness 12/06/2015  . [facility] spotted fever   . S/P CABG x 4   . Sick sinus syndrome ([place]) 01/31/2014  . Sustained ventricular tachycardia ([place]) 07/27/2014  . Urinary retention 12/30/2014    Patient Active Problem List   Diagnosis Date Noted  . Coronary  artery disease involving autologous vein coronary bypass graft without angina pectoris 11/08/2017  . Spondylosis of cervical region without myelopathy or radiculopathy 09/25/2017  . Tear of left rotator cuff 09/25/2017  . Pharyngeal dysphagia 09/08/2017  . Bacteremia   . Generalized weakness 08/25/2017  . Shoulder pain, bilateral 08/25/2017  . Neurogenic bladder 07/21/2017  . Mild cognitive impairment 07/21/2017  . Neurogenic orthostatic hypotension ([place]) 04/21/2017  . Decubitus ulcer of sacral region, stage 2 ([place]) 06/11/2016  . Osteoarthritis 04/23/2016  . Gingivitis 04/15/2016  . Weak 03/14/2016  . AKI (acute kidney injury) ([place]) 03/11/2016  . History of arterial ischemic stroke 03/05/2016  . Rash and nonspecific skin eruption 02/27/2016  . Nonrheumatic aortic valve stenosis   . TIA (transient ischemic attack) 01/10/2016  . Hx of CABG 01/10/2016  . Urinary tract infection, acute 04/06/2015  . Insomnia 03/23/2015  . Gout 02/09/2015  . Depression with anxiety 02/02/2015  . RLS (restless legs syndrome) 02/02/2015  . Combined congestive systolic and diastolic heart failure ([place]) 02/02/2015  . Urinary frequency 02/02/2015  . Pressure ulcer 01/01/2015  . Urinary retention 12/30/2014  . Coronary artery disease involving native coronary artery of native heart without angina pectoris 12/30/2014  . Expressive aphasia 12/30/2014  . Chronic constipation   . Orthostatic hypotension   . Chronic knee pain 12/03/2014  . GERD (gastroesophageal reflux disease)   . Dyspnea on exertion 08/11/2014  . Hypothyroidism 08/11/2014  . VT (ventricular tachycardia) ([place]) 07/27/2014  . Upper airway cough syndrome 07/14/2014  . SSS (sick sinus syndrome) ([place]) 01/31/2014  . Hyperlipidemia 01/30/2014  . Cardiomyopathy, ischemic 08/25/2012  . Pacemaker 08/25/2012  . Dizzy 06/05/2011  . Bradycardia     Past Surgical History:  Procedure Laterality Date  . CARDIAC CATHETERIZATION  04/2010   LIMA to  LAD patent,SVG to OM patent,no in-stnet restenosis RCA  . CATARACT EXTRACTION W/ INTRAOCULAR LENS  IMPLANT, BILATERAL Bilateral 2012  . CORONARY ANGIOPLASTY WITH STENT PLACEMENT  07/2009   bare metal stent to SVG to the RCA  . CORONARY ARTERY BYPASS GRAFT  1996   LIMA to LAD,SVG to RCA & SVG to OM  . INSERT / REPLACE / REMOVE PACEMAKER  2010  . MELANOMA EXCISION  05/1974 X2   "taken off my back" (12/15/2012)  . NM MYOVIEW LTD  06/2011   low risk  . PPM GENERATOR CHANGEOUT N/A 01/22/2017   Procedure: PPM GENERATOR CHANGEOUT;  Surgeon: [name], MD;  Location: [facility];  Service: Cardiovascular;  Laterality: N/A;  . TONSILLECTOMY  1938  . TRANSURETHRAL RESECTION OF PROSTATE  1986  . US ECHOCARDIOGRAPHY  07/11/2009   EF 45-50%        Home Medications    Prior to Admission medications   Medication Sig Start Date End Date Taking? Authorizing Provider  acetaminophen (TYLENOL) 500 MG tablet Take 500 mg by mouth 3 (three) times daily. Total daily Tylenol dose should not exceed 3000 mg    [provider]  Amino Acids-Protein Hydrolys (FEEDING SUPPLEMENT, PRO-STAT SUGAR FREE 64,) LIQD Take 30 mLs by mouth daily.    [provider]  amiodarone (PACERONE) 100 MG tablet Take 1 tablet (100 mg total) by mouth daily. 01/16/17   Croitoru, Mihai, MD  aspirin EC 81 MG tablet Take 1 tablet (81 mg total) by mouth daily. 01/12/16   Reyne Dumas, MD  Cholecalciferol (VITAMIN D3) 5000 UNITS CAPS Take 5,000 Units by mouth every Monday.     [provider]  clopidogrel (PLAVIX) 75 MG tablet TAKE 1 TABLET ONCE DAILY. 06/13/17   Blanchie Serve, MD  Droxidopa 100 MG CAPS Take 100 mg by mouth 3 (three) times daily. 08/12/17   Croitoru, Mihai, MD  guaiFENesin (ROBITUSSIN) 100 MG/5ML SOLN Take 5 mLs (100 mg total) by mouth every 4 (four) hours as needed for cough or to loosen phlegm. 09/04/17   Lavina Hamman, MD  HYDROcodone-acetaminophen (NORCO/VICODIN) 5-325 MG tablet Take 1  tablet by mouth every 4 (four) hours as needed for severe pain. 09/04/17   Lavina Hamman, MD  HYDROcodone-acetaminophen (NORCO/VICODIN) 5-325 MG tablet Take 1 tablet by mouth 2 (two) times daily.    [provider]  levothyroxine (SYNTHROID, LEVOTHROID) 100 MCG tablet Take 100 mcg by mouth daily before breakfast.    [provider]  Lidocaine 4 % PTCH Apply 1 patch topically daily. Left shoulder    [provider]  meclizine (ANTIVERT) 25 MG tablet Take 1 tablet (25 mg total) by mouth 2 (two) times daily as needed for dizziness. 09/04/17   Lavina Hamman, MD  Melatonin 3 MG TABS Take 3 mg by mouth at bedtime.    [provider]  methocarbamol (ROBAXIN) 500 MG tablet Take 250 mg by mouth 2 (two) times daily.     [provider]  midodrine (PROAMATINE) 5 MG tablet Take 1 tablet (5 mg total) by mouth 3 (three) times daily. 11/03/17   Croitoru, Mihai, MD  Multiple Vitamins-Minerals (DECUBI-VITE) CAPS Take 1 capsule by mouth daily.    [provider]  pantoprazole (PROTONIX) 40 MG tablet TAKE 1 TABLET BY MOUTH TWICE DAILY. 02/28/17   Erlene Quan, PA-C  polyethylene glycol (MIRALAX / GLYCOLAX) packet Take 17 g by mouth daily. Hold for loose stool    [provider]  pravastatin (PRAVACHOL) 40 MG tablet Take 1 tablet (40 mg total) by mouth at bedtime. 10/21/16   Blanchie Serve, MD  rOPINIRole (REQUIP) 0.5 MG tablet TAKE 1 TABLET 3 TIMES A DAY AS NEEDED. 07/08/17   Blanchie Serve, MD  sennosides-docusate sodium (SENOKOT-S) 8.6-50 MG tablet Take 2 tablets by mouth at bedtime.     [provider]  torsemide (DEMADEX) 10 MG tablet Take 5 mg by mouth daily.     [provider]  traZODone (DESYREL) 50 MG tablet Take 1 tablet (50 mg total) by mouth at bedtime. 05/19/17   Blanchie Serve, MD    Family History Family History  Problem Relation Age of Onset  . Coronary artery disease Mother   . Diabetes Mother   . Heart disease Mother     . Coronary artery disease Father   . Diabetes Father   . Lung cancer Father   . Heart disease Father   . Anuerysm Son   . Heart disease Brother  Social History Social History   Tobacco Use  . Smoking status: Never Smoker  . Smokeless tobacco: Never Used  Substance Use Topics  . Alcohol use: No    Alcohol/week: 0.0 standard drinks  . Drug use: No     Allergies   Altace [ramipril]; Crestor [rosuvastatin calcium]; Penicillins; Sulfa antibiotics; and Sulfamethoxazole-trimethoprim   Review of Systems Review of Systems  All other systems reviewed and are negative.    Physical Exam Updated Vital Signs BP 127/71 (BP Location: Right Arm)   Pulse 72   Temp 99.1 F (37.3 C) (Oral)   Resp 16   Ht 5\' 10"  (1.778 m)   Wt 77.1 kg   SpO2 97%   BMI 24.39 kg/m   Physical Exam  Constitutional: He is oriented to person, place, and time. He appears well-developed and well-nourished.  Non-toxic appearance. No distress.  HENT:  Head: Normocephalic and atraumatic.  Nose: Nose normal.  Mouth/Throat: Oropharynx is clear and moist.  Eyes: Pupils are equal, round, and reactive to light. Conjunctivae are normal. Right eye exhibits no discharge. Left eye exhibits no discharge.  Neck: Normal range of motion. Neck supple. No JVD present. No tracheal deviation present.  Cardiovascular: Normal rate, regular rhythm, normal heart sounds and intact distal pulses.  Pulmonary/Chest: Effort normal and breath sounds normal. No respiratory distress. He exhibits no tenderness.  No hypoxia or tachypnea.  Abdominal: Soft. Bowel sounds are normal. He exhibits no distension. There is no tenderness. There is no rebound and no guarding.  Musculoskeletal: Normal range of motion.       Right lower leg: He exhibits edema (trace).       Left lower leg: He exhibits edema (trace).  No lower extremity edema or calf tenderness.  Lymphadenopathy:    He has no cervical adenopathy.  Neurological: He is alert  and oriented to person, place, and time.  Skin: Skin is warm and dry. Capillary refill takes less than 2 seconds. He is not diaphoretic.  Psychiatric: His behavior is normal. Judgment and thought content normal.  Nursing note and vitals reviewed.    ED Treatments / Results  Labs (all labs ordered are listed, but only abnormal results are displayed) Labs Reviewed  BASIC METABOLIC PANEL - Abnormal; Notable for the following components:      Result Value   Glucose, Bld 105 (*)    Calcium 8.8 (*)    All other components within normal limits  CBC - Abnormal; Notable for the following components:   RBC 4.09 (*)    Hemoglobin 12.4 (*)    All other components within normal limits  BRAIN NATRIURETIC PEPTIDE  I-STAT TROPONIN, ED    EKG   Radiology Dg Chest 2 View  Result Date: 11/11/2017 CLINICAL DATA:  Chest pain EXAM: CHEST - 2 VIEW COMPARISON:  08/25/2017 FINDINGS: Cardiac enlargement. Postop CABG and dual lead pacemaker. Negative for heart failure. Large hiatal hernia.  No acute skeletal abnormality. IMPRESSION: No acute abnormality.  Large hiatal hernia Electronically Signed   By: Franchot Gallo M.D.   On: 11/11/2017 14:36    Procedures Procedures (including critical care time)  Medications Ordered in ED Medications - No data to display   Initial Impression / Assessment and Plan / ED Course  I have reviewed the triage vital signs and the nursing notes.  Pertinent labs & imaging results that were available during my care of the patient were reviewed by me and considered in my medical decision making (see chart for details).  Patient presents the ED for evaluation of chest pain after eating meat loaf at lunch today.  Patient does have history of CAD with sick sinus syndrome and pacemaker in place.  He is being followed by cardiology closely had normal work-up last week in the office.  On exam today patient states that he is symptom-free after receiving nitro and aspirin at  the facility.  He remains to medical in the ED.  Vital signs are very reassuring.  He has no signs of fluid overload on exam.  Neurovascularly intact in all extremities.  X-ray shows no signs of focal trait or signs of pulmonary edema as reviewed by myself.  EKG was performed that showed atrial paced rhythm with old right bundle branch block and appears similar to prior tracing without any signs of acute depression or elevation.  Patient has similar T wave inversions in lead III, V1, V2 3.  Initial lab work is reassuring.  No leukocytosis.  Normal hemoglobin.  No significant elect light derangement.  Troponin was negative.  Patient's cardiologist Dr. Sallyanne Kuster was in the ED today.  I asked him to see patient in consultation.  He interrogated patient's pacemaker does not show any signs recent episodes of atrial ventricular arrhythmia.  He felt the patient's chest pain presentation was more consistent with GI etiology possibility of esophageal spasm and the quick response to sublingual nitroglycerin.  He felt that the patient's delta troponin was negative and he remains a symptomatically on the ED he can follow-up in outpatient setting.  Presentation not seem consistent with PE, dissection or ACS at this time.  He will follow-up in outpatient setting.  Please see his full consult note.  Patient seen by my attending Dr. Venora Maples who would would like to treat patient for developing pneumonia given his symptoms and productive cough although x-ray showed no signs of focal infiltrate.  Will treat with doxycycline given penicillin allergy with rash.  Patient does not meet [facility] sepsis criteria and there is no indication for admission at this time.  Patient signed out to oncoming provider Dr.Rees to follow-up on repeat troponin and reassessment.  If pt remains asymptomatic pt may be discharged home with outpatient follow-up.  Final Clinical Impressions(s) / ED Diagnoses   Final diagnoses:  Chest pain, unspecified  type  Cough  SOB (shortness of breath)    ED Discharge Orders    None       Aaron Edelman 11/11/17 1603    Jola Schmidt, MD 11/11/17 1650

## 2017-11-11 NOTE — ED Notes (Signed)
Cards at bedside to interrogate pacemaker

## 2017-11-12 ENCOUNTER — Non-Acute Institutional Stay (SKILLED_NURSING_FACILITY): Payer: Medicare Other | Admitting: Family

## 2017-11-12 ENCOUNTER — Telehealth: Payer: Self-pay

## 2017-11-12 ENCOUNTER — Encounter: Payer: Self-pay | Admitting: Family

## 2017-11-12 DIAGNOSIS — R1012 Left upper quadrant pain: Secondary | ICD-10-CM

## 2017-11-12 DIAGNOSIS — J189 Pneumonia, unspecified organism: Secondary | ICD-10-CM | POA: Diagnosis not present

## 2017-11-12 NOTE — Progress Notes (Signed)
Location:  [facility] Room Number: 31 Place of Service:  SNF [phone]) Provider: Dinah Ngetich FNP-C  Wardell Honour, MD  Patient Care Team: Wardell Honour, MD as PCP - General (Family Medicine) [name], MD as PCP - Cardiology (Cardiology) [name], MD as Attending Physician (Cardiology) Vevelyn Royals, MD as Consulting Physician (Ophthalmology) Franchot Gallo, MD as Consulting Physician (Urology) Tanda Rockers, MD as Consulting Physician (Pulmonary Disease) Allyn Kenner, MD (Dermatology) Deliah Goody, PA-C as Physician Assistant (Physician Assistant) Ngetich, Nelda Bucks, NP as Nurse Practitioner (Family Medicine)  Extended Emergency Contact Information Primary Emergency Contact: Habibi,[name] Address: [address]          [place] Phone: [phone] Mobile Phone: [phone] Relation: Spouse Secondary Emergency Contact: [name],Smallman  [place] Mobile Phone: [phone] Relation: Daughter  Code Status:  Full Code  Goals of care: Advanced Directive information Advanced Directives 11/11/2017  Does Patient Have a Medical Advance Directive? Yes  Type of Paramedic of [place];Living will  Does patient want to make changes to medical advance directive? No - Patient declined  Copy of [place] in Chart? No - copy requested  Would patient like information on creating a medical advance directive? -  Pre-existing out of facility DNR order (yellow form or pink MOST form) -     Chief Complaint  Patient presents with  . Acute Visit    ED follow up     HPI:  Pt is a 84 y.o. male seen today at [facility] for an acute visit for follow up ED visit.He was send to the ED 11/11/2017 after he complained of left upper quadrant and chest pain.His EKG in the ED showed no acute ischemia.Troponin x 2 was negative.Due to tenderness of left upper  Quadrant abdomen CT scan of the abdomen and chest was done which showed pneumonia.he was discharged back to the skilled Nursing facility on Doxycycline 100 mg tablet twice daily x 10 days.He denies any pain today.No fever, chills,shortness of breath reported.He states has a non-productive cough.    Past Medical History:  Diagnosis Date  . Arthritis    "minor, back and sometimes knees" (12/15/2012)  . Bradycardia    AFib/SSS s/p St Jude PPM 04/12/2008  . CAD (coronary artery disease) 12/30/2014   CABG (LIMA-LAD, SVG-RCA, SVG-OM in 1996).  07/2009 BMS to SVG-RCA. Cath in 04/2010 with patent stents   . Cardiomyopathy, ischemic 08/25/2012  . CHF (congestive heart failure) ([place])   . Chronic knee pain 12/03/2014  . Combined congestive systolic and diastolic heart failure ([place]) 02/02/2015   Hx EF 41%. BNP 96.8 02/21/15 Torsemide 04/06/15 Na 142, K 4.6, Bun 16, creat 0.89 04/20/14 BNP 111.7, Na 142, K 4.6, Bun 16, creat 0.9   . Depression with anxiety 02/02/2015   02/21/15 Hgb A1c 5.8 03/10/15 MMSE 30/30   . Dizziness, after diuretic asscoiated with hypotension and responded to fluid bolus 06/05/2011   04/28/15 US carotid R+L normal bilateral arterial velocities.    Marland Kitchen Dyspnea 08/11/2014   Followed in Pulmonary clinic/ [facility]/ [facility]  - 08/11/2014  Walked RA x 1 laps @ 185 ft each stopped due to fatigue/off balance/ slow pace/  no sob or desat  - PFT's  09/26/2014  FEV1 2.26 (85 % ) ratio 76  p no % improvement from saba with DLCO  67 % corrects to 93 % for alv volume  Since prev study 08/04/13 minimal change lung vol or dlco    . Embolic cerebral infarction ([place]) 12/06/2015  . Exertional shortness of breath    "sometimes walking" (12/15/2012)  . GERD (gastroesophageal reflux disease)   . Gout 02/09/2015  . Heart murmur    "just told I had one today" (12/15/2012)  . Hiatal hernia   . Hyperlipidemia   . Hypertension   . Hypothyroid   . Influenza A 03/10/2016  . Insomnia   . Melanoma of back ([place]) 1976   . Myocardial infarction [facility]) 1996; 2011   "both silent" (12/15/2012)  . Nonrheumatic aortic valve stenosis   . Orthostatic hypotension   . Osteoporosis, senile   . Pacemaker   . RBBB   . Restless leg 02/02/2015  . Right leg weakness 12/06/2015  . [facility] spotted fever   . S/P CABG x 4   . Sick sinus syndrome ([place]) 01/31/2014  . Sustained ventricular tachycardia ([place]) 07/27/2014  . Urinary retention 12/30/2014   Past Surgical History:  Procedure Laterality Date  . CARDIAC CATHETERIZATION  04/2010   LIMA to LAD patent,SVG to OM patent,no in-stnet restenosis RCA  . CATARACT EXTRACTION W/ INTRAOCULAR LENS  IMPLANT, BILATERAL Bilateral 2012  . CORONARY ANGIOPLASTY WITH STENT PLACEMENT  07/2009   bare metal stent to SVG to the RCA  . CORONARY ARTERY BYPASS GRAFT  1996   LIMA to LAD,SVG to RCA & SVG to OM  . INSERT / REPLACE / REMOVE PACEMAKER  2010  . MELANOMA EXCISION  05/1974 X2   "taken off my back" (12/15/2012)  . NM MYOVIEW LTD  06/2011   low risk  . PPM GENERATOR CHANGEOUT N/A 01/22/2017   Procedure: PPM GENERATOR CHANGEOUT;  Surgeon: [name], MD;  Location: [facility] CV LAB;  Service: Cardiovascular;  Laterality: N/A;  . TONSILLECTOMY  1938  . TRANSURETHRAL RESECTION OF PROSTATE  1986  . US ECHOCARDIOGRAPHY  07/11/2009   EF 45-50%    Allergies  Allergen Reactions  . Altace [Ramipril] Cough  . Crestor [Rosuvastatin Calcium] Rash  . Penicillins Rash and Other (See Comments)    Has patient had a PCN reaction causing immediate rash, facial/tongue/throat swelling, SOB or lightheadedness with hypotension: Yes Has patient had a PCN reaction causing severe rash involving mucus membranes or skin necrosis: No Has patient had a PCN reaction that required hospitalization No Has patient had a PCN reaction occurring within the last 10 years: No If all of the above answers are "NO", then may proceed with Cephalosporin use.   . Sulfa Antibiotics Rash  .  Sulfamethoxazole-Trimethoprim Rash    Outpatient Encounter Medications as of 11/12/2017  Medication Sig  . acetaminophen (TYLENOL) 500 MG tablet Take 500 mg by mouth See admin instructions. Take 500 mg by mouth three times a day and do not exceed 3,000 mg in 24 hours from all sources  . Amino Acids-Protein Hydrolys (FEEDING SUPPLEMENT, PRO-STAT SUGAR FREE 64,) LIQD Take 30 mLs by mouth daily.  Marland Kitchen amiodarone (PACERONE) 100 MG tablet Take 1 tablet (100 mg total) by mouth daily.  Marland Kitchen aspirin EC 81 MG tablet Take 1 tablet (81 mg total) by mouth daily.  . Cholecalciferol (VITAMIN D3) 5000 UNITS CAPS Take 5,000 Units by mouth every Monday.   . Clobetasol Propionate (CORMAX SCALP APPLICATION EX) Apply 1 application topically See admin instructions. Apply to affected areas of the scalp 2 times a day and as needed/avoid face, groin, and axillae  . clopidogrel (PLAVIX) 75 MG tablet  TAKE 1 TABLET ONCE DAILY. (Patient taking differently: Take 75 mg by mouth daily. )  . doxycycline (VIBRAMYCIN) 100 MG capsule Take 1 capsule (100 mg total) by mouth 2 (two) times daily.  . Droxidopa 100 MG CAPS Take 100 mg by mouth 3 (three) times daily.  Marland Kitchen guaiFENesin (ROBITUSSIN) 100 MG/5ML SOLN Take 5 mLs (100 mg total) by mouth every 4 (four) hours as needed for cough or to loosen phlegm.  Marland Kitchen HYDROcodone-acetaminophen (NORCO/VICODIN) 5-325 MG tablet Take 1 tablet by mouth every 4 (four) hours as needed for severe pain. (Patient taking differently: Take 1 tablet by mouth 2 (two) times daily. Take 1 tablet by mouth two times a day and 1-2 tablets every four hours as needed for moderate to sever pain/NOT TO EXCEED 3,000 MG FROM ALL SOURCES IN A 24-HR PERIOD)  . levothyroxine (SYNTHROID, LEVOTHROID) 100 MCG tablet Take 100 mcg by mouth daily before breakfast.  . Lidocaine 4 % PTCH Apply 1 patch topically daily. Left shoulder  . meclizine (ANTIVERT) 25 MG tablet Take 1 tablet (25 mg total) by mouth 2 (two) times daily as needed for  dizziness.  . Melatonin 3 MG TABS Take 3 mg by mouth at bedtime.  . methocarbamol (ROBAXIN) 500 MG tablet Take 250 mg by mouth 2 (two) times daily.   . midodrine (PROAMATINE) 5 MG tablet Take 1 tablet (5 mg total) by mouth 3 (three) times daily.  . Multiple Vitamins-Minerals (DECUBI-VITE) CAPS Take 1 capsule by mouth daily.  . NONFORMULARY OR COMPOUNDED ITEM Nizoral 2% cream mixed with Cutivate 0.05% 1:1 ratio: Apply to affected areas of the buttocks 2 times a day and as needed for rash  . pantoprazole (PROTONIX) 40 MG tablet TAKE 1 TABLET BY MOUTH TWICE DAILY.  Marland Kitchen polyethylene glycol (MIRALAX / GLYCOLAX) packet Take 17 g by mouth daily. Hold for loose stool  . pravastatin (PRAVACHOL) 40 MG tablet Take 1 tablet (40 mg total) by mouth at bedtime.  Marland Kitchen rOPINIRole (REQUIP) 0.5 MG tablet TAKE 1 TABLET 3 TIMES A DAY AS NEEDED. (Patient taking differently: Take 0.5 mg by mouth 3 (three) times daily as needed (for restless legs syndrome). )  . sennosides-docusate sodium (SENOKOT-S) 8.6-50 MG tablet Take 2 tablets by mouth at bedtime.   . torsemide (DEMADEX) 5 MG tablet Take 5 mg by mouth daily.  . traZODone (DESYREL) 50 MG tablet Take 1 tablet (50 mg total) by mouth at bedtime.  Marland Kitchen ZINC OXIDE, TOPICAL, 10 % CREA Apply 1 application topically See admin instructions. Apply to buttocks every shift and as needed for redness   No facility-administered encounter medications on file as of 11/12/2017.     Review of Systems  Constitutional: Negative for appetite change, chills, fatigue, fever and unexpected weight change.  HENT: Positive for hearing loss. Negative for congestion, rhinorrhea, sinus pressure, sinus pain, sneezing and sore throat.   Respiratory: Positive for cough. Negative for chest tightness, shortness of breath and wheezing.   Cardiovascular: Positive for leg swelling. Negative for chest pain and palpitations.  Gastrointestinal: Negative for abdominal distention, abdominal pain, constipation,  diarrhea, nausea and vomiting.  Musculoskeletal: Positive for gait problem.  Skin: Negative for color change, pallor and rash.  Neurological: Negative for dizziness, light-headedness and headaches.  Psychiatric/Behavioral: Negative for agitation, confusion and sleep disturbance. The patient is not nervous/anxious.     Immunization History  Administered Date(s) Administered  . Influenza, High Dose Seasonal PF 11/01/2014, 11/13/2016  . Influenza-Unspecified 12/05/2013, 11/05/2014, 11/16/2015  . PPD Test 03/07/2014  .  Pneumococcal Conjugate-13 10/05/2009  . Pneumococcal Polysaccharide-23 11/05/2005  . Pneumococcal-Unspecified 10/05/2009  . Tdap 08/02/2014  . Zoster 11/05/2005   Pertinent  Health Maintenance Due  Topic Date Due  . PNA vac Low Risk Adult (2 of 2 - PCV13) 10/06/2010  . INFLUENZA VACCINE  09/04/2017  . DEXA SCAN  12/14/2024   Fall Risk  10/09/2017 10/02/2016 10/30/2015 09/05/2015 02/02/2015  Falls in the past year? No No No No No  Risk for fall due to : - - Impaired balance/gait - -   Functional Status Survey:    Vitals:   11/12/17 1529  BP: 129/61  Pulse: 74  Resp: 18  Temp: 98.7 F (37.1 C)  SpO2: 95%  Weight: 179 lb 11.2 oz (81.5 kg)  Height: 5\' 10"  (1.778 m)   Body mass index is 25.78 kg/m. Physical Exam  Constitutional: He appears well-developed and well-nourished. No distress.  HENT:  Head: Normocephalic.  Mouth/Throat: Oropharynx is clear and moist. No oropharyngeal exudate.  Eyes: Pupils are equal, round, and reactive to light. Conjunctivae are normal. Right eye exhibits no discharge. Left eye exhibits no discharge. No scleral icterus.  Neck: Normal range of motion. No JVD present. No thyromegaly present.  Cardiovascular: Normal rate, regular rhythm, normal heart sounds and intact distal pulses. Exam reveals no gallop and no friction rub.  No murmur heard. Pulmonary/Chest: Effort normal. No respiratory distress. He has no wheezes.  Left lung rales  with bilateral diminished lung bases   Lymphadenopathy:    He has no cervical adenopathy.  Nursing note and vitals reviewed.   Labs reviewed: Recent Labs    08/26/17 0426 08/27/17 0423  08/31/17 0342 09/04/17 1054  09/11/17 09/12/17 10/09/17 11/11/17 1350  NA 139 142   < > 139 140   < > 136 136* 139 137  K 4.2 3.8   < > 3.7 3.4*   < > 4.2 4.2 4.2 4.2  CL 107 108   < > 106 105  --   --   --   --  103  CO2 25 26   < > 25 26  --   --   --   --  25  GLUCOSE 104* 109*   < > 120* 94  --   --   --   --  105*  BUN 13 18   < > 17 15   < > 17  --  16 14  CREATININE 0.49* 0.57*   < > 0.51* 0.51*   < > 0.59 0.6 0.64 0.71  CALCIUM 8.6* 8.5*   < > 8.3* 8.0*   < > 8.2  --  8.7 8.8*  MG 2.2 2.1  --   --   --   --   --   --   --   --   PHOS 3.4  --   --   --   --   --   --   --   --   --    < > = values in this interval not displayed.   Recent Labs    09/11/17 09/12/17 10/09/17 11/11/17 1800  AST 20 20 13 17   ALT 25 25 8 12   ALKPHOS 76 76 79 75  BILITOT 0.3  --  0.4 0.3  PROT 5.8  --  6.3 6.3*  ALBUMIN 3.0  --  3.6 2.8*   Recent Labs    04/07/17 2358  08/27/17 0423  09/04/17 1054  09/11/17 09/12/17 10/09/17 10/26/17 11/11/17  1350  WBC 8.6   < > 8.9   < > 12.2*   < > 7.6 7.6 7.4 9.4 10.3  NEUTROABS 6.2  --  6.5  --  8.9*  --   --   --   --   --   --   HGB 13.7   < > 12.1*   < > 12.8*   < > 12.8 12.8* 12.8 13.4* 12.4*  HCT 41.1   < > 36.9*   < > 39.2   < > 38.8 39* 37.9 39* 40.0  MCV 97.4   < > 97.1   < > 97.3   < > 94.6  --  92.7  --  97.8  PLT 230   < > 243   < > 354  --   --  300  --  297 300   < > = values in this interval not displayed.   Lab Results  Component Value Date   TSH 1.125 08/26/2017   Lab Results  Component Value Date   HGBA1C 5.6 12/07/2015   Lab Results  Component Value Date   CHOL 103 05/05/2017   HDL 39 (L) 05/05/2017   LDLCALC 43 05/05/2017   TRIG 129 05/05/2017   CHOLHDL 2.6 05/05/2017    Significant Diagnostic Results in last 30 days:  Dg Chest  2 View  Result Date: 11/11/2017 CLINICAL DATA:  Chest pain EXAM: CHEST - 2 VIEW COMPARISON:  08/25/2017 FINDINGS: Cardiac enlargement. Postop CABG and dual lead pacemaker. Negative for heart failure. Large hiatal hernia.  No acute skeletal abnormality. IMPRESSION: No acute abnormality.  Large hiatal hernia Electronically Signed   By: Franchot Gallo M.D.   On: 11/11/2017 14:36   Ct Chest W Contrast  Result Date: 11/11/2017 CLINICAL DATA:  84 year old male with history of lightheadedness and loss of appetite over the past 3 days. Productive cough with yellow sputum. No recent fever. Pounding chest pain. EXAM: CT CHEST, ABDOMEN, AND PELVIS WITH CONTRAST TECHNIQUE: Multidetector CT imaging of the chest, abdomen and pelvis was performed following the standard protocol during bolus administration of intravenous contrast. CONTRAST:  156mL OMNIPAQUE IOHEXOL 300 MG/ML  SOLN COMPARISON:  Chest CT 09/25/2012. CT the abdomen and pelvis 08/01/2016. FINDINGS: CT CHEST FINDINGS Cardiovascular: Heart size is mildly enlarged. There is no significant pericardial fluid, thickening or pericardial calcification. There is aortic atherosclerosis, as well as atherosclerosis of the great vessels of the mediastinum and the coronary arteries, including calcified atherosclerotic plaque in the left anterior descending, left circumflex and right coronary arteries. Status post median sternotomy for CABG including LIMA to the LAD. Severe calcifications of the aortic valve. Moderate calcifications of the mitral valve. Left-sided pacemaker device in place with lead tips terminating in the right atrium and right ventricle. Mediastinum/Nodes: No pathologically enlarged mediastinal or hilar lymph nodes. Large hiatal hernia with predominantly intrathoracic stomach, with an appearance most compatible with or organoaxial volvulus, similar to prior study from 08/01/2016. No axillary lymphadenopathy. Lungs/Pleura: Areas of scarring and atelectasis are  noted in the lower lobes of the lungs bilaterally. Patchy ground-glass attenuation in the mid to lower lungs bilaterally, nonspecific, but favored to be of infectious or inflammatory etiology. No pleural effusions. No definite suspicious appearing pulmonary nodules or masses are noted. Musculoskeletal: Median sternotomy wires. There are no aggressive appearing lytic or blastic lesions noted in the visualized portions of the skeleton. CT ABDOMEN PELVIS FINDINGS Hepatobiliary: No suspicious cystic or solid hepatic lesions. No intra or extrahepatic biliary ductal dilatation.  Gallbladder is normal in appearance. Pancreas: No pancreatic mass. No pancreatic ductal dilatation. No pancreatic or peripancreatic fluid or inflammatory changes. Spleen: Unremarkable. Adrenals/Urinary Tract: Multiple low-attenuation lesions are noted in the kidneys bilaterally, largest of which are compatible with simple cysts, measuring up to 5.1 cm in the medial aspect of the lower pole of the left kidney. Several subcentimeter low-attenuation lesions in both kidneys are too small to definitively characterize, but are favored to represent tiny cysts. Bilateral adrenal glands are normal in appearance. No hydroureteronephrosis. Urinary bladder is completely decompressed with an indwelling Foley balloon catheter in place. Stomach/Bowel: Intrathoracic stomach, as above. No pathologic dilatation of small bowel or colon. The appendix is not confidently identified and may be surgically absent. Regardless, there are no inflammatory changes noted adjacent to the cecum to suggest the presence of an acute appendicitis at this time. Vascular/Lymphatic: Extensive aortic atherosclerosis with focal saccular aneurysm of the infrarenal abdominal aorta which measures up to 2.4 x 2.5 cm. No lymphadenopathy noted in the abdomen or pelvis. Reproductive: Prostate gland and seminal vesicles are unremarkable in appearance. Other: No significant volume of ascites.  No  pneumoperitoneum. Musculoskeletal: There are no aggressive appearing lytic or blastic lesions noted in the visualized portions of the skeleton. IMPRESSION: 1. Patchy areas of ground-glass attenuation are noted in the lungs bilaterally, presumably of infectious or inflammatory etiology. Findings suggests mild multilobar bronchopneumonia. 2. No acute findings noted in the abdomen or pelvis. 3. Large hiatal hernia with what appears to be a chronic organoaxial gastric volvulus, similar to prior study from 08/01/2016. 4. Aortic atherosclerosis, in addition to 3 vessel coronary artery disease. Status post median sternotomy for CABG including LIMA to the LAD. There is also a focal saccular aneurysm of the infrarenal abdominal aorta which measures up to 2.4 x 2.5 cm. This is similar to prior study from 2018. 5. There are severe calcifications of the aortic valve and moderate calcifications of the mitral valve. Echocardiographic correlation for evaluation of potential valvular dysfunction may be warranted if clinically indicated. 6. Additional incidental findings, as above. Electronically Signed   By: Vinnie Langton M.D.   On: 11/11/2017 19:36   Ct Abdomen Pelvis W Contrast  Result Date: 11/11/2017 CLINICAL DATA:  84 year old male with history of lightheadedness and loss of appetite over the past 3 days. Productive cough with yellow sputum. No recent fever. Pounding chest pain. EXAM: CT CHEST, ABDOMEN, AND PELVIS WITH CONTRAST TECHNIQUE: Multidetector CT imaging of the chest, abdomen and pelvis was performed following the standard protocol during bolus administration of intravenous contrast. CONTRAST:  169mL OMNIPAQUE IOHEXOL 300 MG/ML  SOLN COMPARISON:  Chest CT 09/25/2012. CT the abdomen and pelvis 08/01/2016. FINDINGS: CT CHEST FINDINGS Cardiovascular: Heart size is mildly enlarged. There is no significant pericardial fluid, thickening or pericardial calcification. There is aortic atherosclerosis, as well as  atherosclerosis of the great vessels of the mediastinum and the coronary arteries, including calcified atherosclerotic plaque in the left anterior descending, left circumflex and right coronary arteries. Status post median sternotomy for CABG including LIMA to the LAD. Severe calcifications of the aortic valve. Moderate calcifications of the mitral valve. Left-sided pacemaker device in place with lead tips terminating in the right atrium and right ventricle. Mediastinum/Nodes: No pathologically enlarged mediastinal or hilar lymph nodes. Large hiatal hernia with predominantly intrathoracic stomach, with an appearance most compatible with or organoaxial volvulus, similar to prior study from 08/01/2016. No axillary lymphadenopathy. Lungs/Pleura: Areas of scarring and atelectasis are noted in the lower lobes of the  lungs bilaterally. Patchy ground-glass attenuation in the mid to lower lungs bilaterally, nonspecific, but favored to be of infectious or inflammatory etiology. No pleural effusions. No definite suspicious appearing pulmonary nodules or masses are noted. Musculoskeletal: Median sternotomy wires. There are no aggressive appearing lytic or blastic lesions noted in the visualized portions of the skeleton. CT ABDOMEN PELVIS FINDINGS Hepatobiliary: No suspicious cystic or solid hepatic lesions. No intra or extrahepatic biliary ductal dilatation. Gallbladder is normal in appearance. Pancreas: No pancreatic mass. No pancreatic ductal dilatation. No pancreatic or peripancreatic fluid or inflammatory changes. Spleen: Unremarkable. Adrenals/Urinary Tract: Multiple low-attenuation lesions are noted in the kidneys bilaterally, largest of which are compatible with simple cysts, measuring up to 5.1 cm in the medial aspect of the lower pole of the left kidney. Several subcentimeter low-attenuation lesions in both kidneys are too small to definitively characterize, but are favored to represent tiny cysts. Bilateral adrenal  glands are normal in appearance. No hydroureteronephrosis. Urinary bladder is completely decompressed with an indwelling Foley balloon catheter in place. Stomach/Bowel: Intrathoracic stomach, as above. No pathologic dilatation of small bowel or colon. The appendix is not confidently identified and may be surgically absent. Regardless, there are no inflammatory changes noted adjacent to the cecum to suggest the presence of an acute appendicitis at this time. Vascular/Lymphatic: Extensive aortic atherosclerosis with focal saccular aneurysm of the infrarenal abdominal aorta which measures up to 2.4 x 2.5 cm. No lymphadenopathy noted in the abdomen or pelvis. Reproductive: Prostate gland and seminal vesicles are unremarkable in appearance. Other: No significant volume of ascites.  No pneumoperitoneum. Musculoskeletal: There are no aggressive appearing lytic or blastic lesions noted in the visualized portions of the skeleton. IMPRESSION: 1. Patchy areas of ground-glass attenuation are noted in the lungs bilaterally, presumably of infectious or inflammatory etiology. Findings suggests mild multilobar bronchopneumonia. 2. No acute findings noted in the abdomen or pelvis. 3. Large hiatal hernia with what appears to be a chronic organoaxial gastric volvulus, similar to prior study from 08/01/2016. 4. Aortic atherosclerosis, in addition to 3 vessel coronary artery disease. Status post median sternotomy for CABG including LIMA to the LAD. There is also a focal saccular aneurysm of the infrarenal abdominal aorta which measures up to 2.4 x 2.5 cm. This is similar to prior study from 2018. 5. There are severe calcifications of the aortic valve and moderate calcifications of the mitral valve. Echocardiographic correlation for evaluation of potential valvular dysfunction may be warranted if clinically indicated. 6. Additional incidental findings, as above. Electronically Signed   By: Vinnie Langton M.D.   On: 11/11/2017 19:36     Assessment/Plan 1. Community acquired pneumonia, unspecified laterality Afebrile.Multilobaler bronchopneumonia on CT scan.started on Doxycycline 100 mg twice daily x 10 days in the ED.will add incentive spirometer every 6 hours while awake.start on Florastor 250 mg capsule one by mouth twice daily x 10 days for antibiotics associated diarrhea prevention. Monitor vital signs every shift x 5 days then d/C.check CBC/diff 11/17/2017.  2. Left upper quadrant pain Resolved.no tenderness on palpation.Negative CT scan 11/11/2017.continue to monitor could be pneumonia related.   Family/ staff Communication: Reviewed plan of care with patient and facility Nurse supervisor  Labs/tests ordered: CBC/diff 11/17/2017.  Sandrea Hughs, NP

## 2017-11-12 NOTE — Telephone Encounter (Signed)
I have made the 1st attempt to contact the patient or family member in charge, in order to follow up from recently being discharged from the hospital. I left a message on voicemail but I will make another attempt at a different time.  

## 2017-11-13 ENCOUNTER — Telehealth: Payer: Self-pay

## 2017-11-13 NOTE — Telephone Encounter (Signed)
I have made the 2nd attempt to contact the patient or family member in charge, in order to follow up from recently being discharged from the hospital.   Patient seen at [facility] today by Burley Saver.

## 2017-11-17 LAB — CBC AND DIFFERENTIAL
HCT: 35 — AB (ref 41–53)
Hemoglobin: 11.9 — AB (ref 13.5–17.5)
Platelets: 312 (ref 150–399)
WBC: 9.1

## 2017-11-24 ENCOUNTER — Emergency Department (HOSPITAL_COMMUNITY): Payer: Medicare Other

## 2017-11-24 ENCOUNTER — Other Ambulatory Visit: Payer: Self-pay

## 2017-11-24 ENCOUNTER — Encounter (HOSPITAL_COMMUNITY): Payer: Self-pay

## 2017-11-24 ENCOUNTER — Non-Acute Institutional Stay (SKILLED_NURSING_FACILITY): Payer: Medicare Other | Admitting: Family

## 2017-11-24 ENCOUNTER — Emergency Department (HOSPITAL_COMMUNITY)
Admission: EM | Admit: 2017-11-24 | Discharge: 2017-11-25 | Disposition: A | Discharge status: Skilled Nursing Facility | Payer: Medicare Other | Attending: Emergency Medicine | Admitting: Emergency Medicine

## 2017-11-24 ENCOUNTER — Encounter: Payer: Self-pay | Admitting: Family

## 2017-11-24 DIAGNOSIS — Z7982 Long term (current) use of aspirin: Secondary | ICD-10-CM | POA: Diagnosis not present

## 2017-11-24 DIAGNOSIS — Z960 Presence of urogenital implants: Secondary | ICD-10-CM

## 2017-11-24 DIAGNOSIS — E039 Hypothyroidism, unspecified: Secondary | ICD-10-CM | POA: Diagnosis not present

## 2017-11-24 DIAGNOSIS — R102 Pelvic and perineal pain: Secondary | ICD-10-CM | POA: Diagnosis not present

## 2017-11-24 DIAGNOSIS — I2510 Atherosclerotic heart disease of native coronary artery without angina pectoris: Secondary | ICD-10-CM | POA: Insufficient documentation

## 2017-11-24 DIAGNOSIS — Z951 Presence of aortocoronary bypass graft: Secondary | ICD-10-CM | POA: Diagnosis not present

## 2017-11-24 DIAGNOSIS — Z7901 Long term (current) use of anticoagulants: Secondary | ICD-10-CM | POA: Diagnosis not present

## 2017-11-24 DIAGNOSIS — N3090 Cystitis, unspecified without hematuria: Secondary | ICD-10-CM | POA: Diagnosis not present

## 2017-11-24 DIAGNOSIS — R310 Gross hematuria: Secondary | ICD-10-CM

## 2017-11-24 DIAGNOSIS — Z79899 Other long term (current) drug therapy: Secondary | ICD-10-CM | POA: Diagnosis not present

## 2017-11-24 DIAGNOSIS — I110 Hypertensive heart disease with heart failure: Secondary | ICD-10-CM | POA: Diagnosis not present

## 2017-11-24 DIAGNOSIS — Z8673 Personal history of transient ischemic attack (TIA), and cerebral infarction without residual deficits: Secondary | ICD-10-CM | POA: Insufficient documentation

## 2017-11-24 DIAGNOSIS — R1030 Lower abdominal pain, unspecified: Secondary | ICD-10-CM | POA: Diagnosis present

## 2017-11-24 DIAGNOSIS — Z978 Presence of other specified devices: Secondary | ICD-10-CM

## 2017-11-24 DIAGNOSIS — I509 Heart failure, unspecified: Secondary | ICD-10-CM | POA: Diagnosis not present

## 2017-11-24 MED ORDER — SODIUM CHLORIDE 0.9 % IV BOLUS
500.0000 mL | Freq: Once | INTRAVENOUS | Status: AC
  Administered 2017-11-25: 500 mL via INTRAVENOUS

## 2017-11-24 MED ORDER — FENTANYL CITRATE (PF) 100 MCG/2ML IJ SOLN
50.0000 ug | INTRAMUSCULAR | Status: DC | PRN
  Administered 2017-11-25: 50 ug via INTRAVENOUS
  Filled 2017-11-24: qty 2

## 2017-11-24 NOTE — Progress Notes (Signed)
Location:  [facility] Number: 97 Place of Service:  SNF [id number]) Provider: Beryl Hornberger FNP-C  Wardell Honour, MD  Patient Care Team: Wardell Honour, MD as PCP - General (Family Medicine) [name], MD as PCP - Cardiology (Cardiology) [name], MD as Attending Physician (Cardiology) Vevelyn Royals, MD as Consulting Physician (Ophthalmology) Franchot Gallo, MD as Consulting Physician (Urology) Tanda Rockers, MD as Consulting Physician (Pulmonary Disease) Allyn Kenner, MD (Dermatology) Deliah Goody, PA-C as Physician Assistant (Physician Assistant) Demetruis Depaul, Nelda Bucks, NP as Nurse Practitioner (Family Medicine)  Extended Emergency Contact Information Primary Emergency Contact: Sager,[name] Address: [address]          [place] Phone: [phone] Mobile Phone: [phone] Relation: Spouse Secondary Emergency Contact: [name],Boley  [place] Mobile Phone: [phone] Relation: Daughter  Code Status:  Full code  Goals of care: Advanced Directive information Advanced Directives 11/11/2017  Does Patient Have a Medical Advance Directive? Yes  Type of Paramedic of [place];Living will  Does patient want to make changes to medical advance directive? No - Patient declined  Copy of [place] in Chart? No - copy requested  Would patient like information on creating a medical advance directive? -  Pre-existing out of facility DNR order (yellow form or pink MOST form) -     Chief Complaint  Patient presents with  . Acute Visit    blood in the urine     HPI:  Pt is a 84 y.o. male seen today at [facility] for an acute visit for evaluation of blood in the urine.He is seen in his room today with facility Nurse present at bedside.Nurse reports via SBAR that patient complained of pain on his penis on 11/23/2017.On call provider was  notified who ordered patient's foley catheter to be changed.Patient's urine noted to be bloody.Nurse states urine still blood this morning.He complains of pain across lower abdomen.Foley catheter draining.He denies any fever, chills,nausea or vomiting.    Past Medical History:  Diagnosis Date  . Arthritis    "minor, back and sometimes knees" (12/15/2012)  . Bradycardia    AFib/SSS s/p St Jude PPM 04/12/2008  . CAD (coronary artery disease) 12/30/2014   CABG (LIMA-LAD, SVG-RCA, SVG-OM in 1996).  07/2009 BMS to SVG-RCA. Cath in 04/2010 with patent stents   . Cardiomyopathy, ischemic 08/25/2012  . CHF (congestive heart failure) ([place])   . Chronic knee pain 12/03/2014  . Combined congestive systolic and diastolic heart failure ([place]) 02/02/2015   Hx EF 41%. BNP 96.8 02/21/15 Torsemide 04/06/15 Na 142, K 4.6, Bun 16, creat 0.89 04/20/14 BNP 111.7, Na 142, K 4.6, Bun 16, creat 0.9   . Depression with anxiety 02/02/2015   02/21/15 Hgb A1c 5.8 03/10/15 MMSE 30/30   . Dizziness, after diuretic asscoiated with hypotension and responded to fluid bolus 06/05/2011   04/28/15 US carotid R+L normal bilateral arterial velocities.    Marland Kitchen Dyspnea 08/11/2014   Followed in Pulmonary clinic/ [facility]/ [place]  - 08/11/2014  Walked RA x 1 laps @ 185 ft each stopped due to fatigue/off balance/ slow pace/  no sob or desat  - PFT's  09/26/2014  FEV1 2.26 (85 % ) ratio 76  p no % improvement from saba with DLCO  67 % corrects to 93 % for alv volume      Since prev study 08/04/13 minimal change lung vol or dlco    .  Embolic cerebral infarction ([place]) 12/06/2015  . Exertional shortness of breath    "sometimes walking" (12/15/2012)  . GERD (gastroesophageal reflux disease)   . Gout 02/09/2015  . Heart murmur    "just told I had one today" (12/15/2012)  . Hiatal hernia   . Hyperlipidemia   . Hypertension   . Hypothyroid   . Influenza A 03/10/2016  . Insomnia   . Melanoma of back ([place]) 1976  . Myocardial infarction [facility]) 1996; 2011     "both silent" (12/15/2012)  . Nonrheumatic aortic valve stenosis   . Orthostatic hypotension   . Osteoporosis, senile   . Pacemaker   . RBBB   . Restless leg 02/02/2015  . Right leg weakness 12/06/2015  . [facility] spotted fever   . S/P CABG x 4   . Sick sinus syndrome ([place]) 01/31/2014  . Sustained ventricular tachycardia ([place]) 07/27/2014  . Urinary retention 12/30/2014   Past Surgical History:  Procedure Laterality Date  . CARDIAC CATHETERIZATION  04/2010   LIMA to LAD patent,SVG to OM patent,no in-stnet restenosis RCA  . CATARACT EXTRACTION W/ INTRAOCULAR LENS  IMPLANT, BILATERAL Bilateral 2012  . CORONARY ANGIOPLASTY WITH STENT PLACEMENT  07/2009   bare metal stent to SVG to the RCA  . CORONARY ARTERY BYPASS GRAFT  1996   LIMA to LAD,SVG to RCA & SVG to OM  . INSERT / REPLACE / REMOVE PACEMAKER  2010  . MELANOMA EXCISION  05/1974 X2   "taken off my back" (12/15/2012)  . NM MYOVIEW LTD  06/2011   low risk  . PPM GENERATOR CHANGEOUT N/A 01/22/2017   Procedure: PPM GENERATOR CHANGEOUT;  Surgeon: [name], MD;  Location: [facility];  Service: Cardiovascular;  Laterality: N/A;  . TONSILLECTOMY  1938  . TRANSURETHRAL RESECTION OF PROSTATE  1986  . US ECHOCARDIOGRAPHY  07/11/2009   EF 45-50%    Allergies  Allergen Reactions  . Altace [Ramipril] Cough  . Crestor [Rosuvastatin Calcium] Rash  . Penicillins Rash and Other (See Comments)    Has patient had a PCN reaction causing immediate rash, facial/tongue/throat swelling, SOB or lightheadedness with hypotension: Yes Has patient had a PCN reaction causing severe rash involving mucus membranes or skin necrosis: No Has patient had a PCN reaction that required hospitalization No Has patient had a PCN reaction occurring within the last 10 years: No If all of the above answers are "NO", then may proceed with Cephalosporin use.   . Sulfa Antibiotics Rash  . Sulfamethoxazole-Trimethoprim Rash    Outpatient  Encounter Medications as of 11/24/2017  Medication Sig  . acetaminophen (TYLENOL) 500 MG tablet Take 500 mg by mouth See admin instructions. Take 500 mg by mouth three times a day and do not exceed 3,000 mg in 24 hours from all sources  . Amino Acids-Protein Hydrolys (FEEDING SUPPLEMENT, PRO-STAT SUGAR FREE 64,) LIQD Take 30 mLs by mouth daily.  Marland Kitchen amiodarone (PACERONE) 100 MG tablet Take 1 tablet (100 mg total) by mouth daily.  Marland Kitchen aspirin EC 81 MG tablet Take 1 tablet (81 mg total) by mouth daily.  . Cholecalciferol (VITAMIN D3) 5000 UNITS CAPS Take 5,000 Units by mouth every Monday.   . Clobetasol Propionate (CORMAX SCALP APPLICATION EX) Apply 1 application topically See admin instructions. Apply to affected areas of the scalp 2 times a day and as needed/avoid face, groin, and axillae  . clopidogrel (PLAVIX) 75 MG tablet TAKE 1 TABLET ONCE DAILY. (Patient taking differently: Take 75 mg by mouth  daily. )  . Droxidopa 100 MG CAPS Take 100 mg by mouth 3 (three) times daily.  Marland Kitchen guaiFENesin (ROBITUSSIN) 100 MG/5ML SOLN Take 5 mLs (100 mg total) by mouth every 4 (four) hours as needed for cough or to loosen phlegm.  Marland Kitchen HYDROcodone-acetaminophen (NORCO/VICODIN) 5-325 MG tablet Take 1 tablet by mouth every 4 (four) hours as needed for severe pain. (Patient taking differently: Take 1 tablet by mouth 2 (two) times daily. Take 1 tablet by mouth two times a day and 1-2 tablets every four hours as needed for moderate to sever pain/NOT TO EXCEED 3,000 MG FROM ALL SOURCES IN A 24-HR PERIOD)  . levothyroxine (SYNTHROID, LEVOTHROID) 100 MCG tablet Take 100 mcg by mouth daily before breakfast.  . Lidocaine 4 % PTCH Apply 1 patch topically daily. Left shoulder  . meclizine (ANTIVERT) 25 MG tablet Take 1 tablet (25 mg total) by mouth 2 (two) times daily as needed for dizziness.  . Melatonin 3 MG TABS Take 3 mg by mouth at bedtime.  . methocarbamol (ROBAXIN) 500 MG tablet Take 250 mg by mouth 2 (two) times daily.   .  midodrine (PROAMATINE) 5 MG tablet Take 1 tablet (5 mg total) by mouth 3 (three) times daily.  . Multiple Vitamins-Minerals (DECUBI-VITE) CAPS Take 1 capsule by mouth daily.  . NONFORMULARY OR COMPOUNDED ITEM Nizoral 2% cream mixed with Cutivate 0.05% 1:1 ratio: Apply to affected areas of the buttocks 2 times a day and as needed for rash  . pantoprazole (PROTONIX) 40 MG tablet TAKE 1 TABLET BY MOUTH TWICE DAILY.  Marland Kitchen polyethylene glycol (MIRALAX / GLYCOLAX) packet Take 17 g by mouth daily. Hold for loose stool  . pravastatin (PRAVACHOL) 40 MG tablet Take 1 tablet (40 mg total) by mouth at bedtime.  Marland Kitchen rOPINIRole (REQUIP) 0.5 MG tablet TAKE 1 TABLET 3 TIMES A DAY AS NEEDED. (Patient taking differently: Take 0.5 mg by mouth 3 (three) times daily as needed (for restless legs syndrome). )  . sennosides-docusate sodium (SENOKOT-S) 8.6-50 MG tablet Take 2 tablets by mouth at bedtime.   . torsemide (DEMADEX) 5 MG tablet Take 5 mg by mouth daily.  . traZODone (DESYREL) 50 MG tablet Take 1 tablet (50 mg total) by mouth at bedtime.  Marland Kitchen ZINC OXIDE, TOPICAL, 10 % CREA Apply 1 application topically See admin instructions. Apply to buttocks every shift and as needed for redness  . doxycycline (VIBRAMYCIN) 100 MG capsule Take 1 capsule (100 mg total) by mouth 2 (two) times daily. (Patient not taking: Reported on 11/24/2017)   No facility-administered encounter medications on file as of 11/24/2017.     Review of Systems  Constitutional: Negative for appetite change, chills, fatigue, fever and unexpected weight change.  Respiratory: Negative for cough, chest tightness, shortness of breath and wheezing.   Cardiovascular: Negative for chest pain, palpitations and leg swelling.  Gastrointestinal: Negative for abdominal distention, constipation, diarrhea, nausea and vomiting.       Lower abdominal pain   Genitourinary: Positive for hematuria. Negative for dysuria, flank pain and urgency.       Indwelling foley  catheter   Musculoskeletal: Positive for gait problem.  Skin: Negative for color change, pallor and rash.  Psychiatric/Behavioral: Negative for agitation and confusion. The patient is not nervous/anxious.        Didn't sleep well previous night due to penis pain     Immunization History  Administered Date(s) Administered  . Influenza, High Dose Seasonal PF 11/01/2014, 11/13/2016  . Influenza-Unspecified 12/05/2013, 11/05/2014,  11/16/2015  . PPD Test 03/07/2014  . Pneumococcal Conjugate-13 10/05/2009  . Pneumococcal Polysaccharide-23 11/05/2005  . Pneumococcal-Unspecified 10/05/2009  . Tdap 08/02/2014  . Zoster 11/05/2005   Pertinent  Health Maintenance Due  Topic Date Due  . PNA vac Low Risk Adult (2 of 2 - PCV13) 10/06/2010  . INFLUENZA VACCINE  11/24/2017 (Originally 09/04/2017)  . DEXA SCAN  12/14/2024   Fall Risk  10/09/2017 10/02/2016 10/30/2015 09/05/2015 02/02/2015  Falls in the past year? No No No No No  Risk for fall due to : - - Impaired balance/gait - -    Vitals:   11/24/17 1255  BP: (!) 156/75  Pulse: 71  Resp: 20  Temp: 97.8 F (36.6 C)  SpO2: 96%  Weight: 180 lb 1.6 oz (81.7 kg)  Height: 5\' 10"  (1.778 m)   Body mass index is 25.84 kg/m. Physical Exam  Constitutional: He is oriented to person, place, and time. He appears well-developed and well-nourished. No distress.  HENT:  Head: Normocephalic.  Mouth/Throat: Oropharynx is clear and moist. No oropharyngeal exudate.  Cardiovascular: Intact distal pulses. Exam reveals no gallop and no friction rub.  Murmur heard. Pulmonary/Chest: Effort normal and breath sounds normal. No respiratory distress. He has no wheezes. He has no rales.  Abdominal: Soft. Bowel sounds are normal. He exhibits no distension and no mass. There is no rebound and no guarding.  Suprapubic tenderness to palpation.  Genitourinary:  Genitourinary Comments: Foley catheter draining adequate amounts of urine.Urine color bloody.   Musculoskeletal: He exhibits no edema or tenderness.  Moves x 4 extremities.transfers with assistance   Neurological: He is oriented to person, place, and time. Gait abnormal.  Skin: Skin is warm and dry. No rash noted. No erythema. No pallor.  Psychiatric: He has a normal mood and affect. His speech is normal and behavior is normal. Judgment and thought content normal.  Nursing note and vitals reviewed.   Labs reviewed: Recent Labs    08/26/17 0426 08/27/17 0423  08/31/17 0342 09/04/17 1054  09/11/17 09/12/17 10/09/17 11/11/17 1350  NA 139 142   < > 139 140   < > 136 136* 139 137  K 4.2 3.8   < > 3.7 3.4*   < > 4.2 4.2 4.2 4.2  CL 107 108   < > 106 105  --   --   --   --  103  CO2 25 26   < > 25 26  --   --   --   --  25  GLUCOSE 104* 109*   < > 120* 94  --   --   --   --  105*  BUN 13 18   < > 17 15   < > 17  --  16 14  CREATININE 0.49* 0.57*   < > 0.51* 0.51*   < > 0.59 0.6 0.64 0.71  CALCIUM 8.6* 8.5*   < > 8.3* 8.0*   < > 8.2  --  8.7 8.8*  MG 2.2 2.1  --   --   --   --   --   --   --   --   PHOS 3.4  --   --   --   --   --   --   --   --   --    < > = values in this interval not displayed.   Recent Labs    09/11/17 09/12/17 10/09/17 11/11/17 1800  AST 20 20  13 17  ALT 25 25 8 12   ALKPHOS 76 76 79 75  BILITOT 0.3  --  0.4 0.3  PROT 5.8  --  6.3 6.3*  ALBUMIN 3.0  --  3.6 2.8*   Recent Labs    04/07/17 2358  08/27/17 0423  09/04/17 1054  09/11/17  10/09/17 10/26/17 11/11/17 1350 11/17/17  WBC 8.6   < > 8.9   < > 12.2*   < > 7.6   < > 7.4 9.4 10.3 9.1  NEUTROABS 6.2  --  6.5  --  8.9*  --   --   --   --   --   --   --   HGB 13.7   < > 12.1*   < > 12.8*   < > 12.8   < > 12.8 13.4* 12.4* 11.9*  HCT 41.1   < > 36.9*   < > 39.2   < > 38.8   < > 37.9 39* 40.0 35*  MCV 97.4   < > 97.1   < > 97.3   < > 94.6  --  92.7  --  97.8  --   PLT 230   < > 243   < > 354  --   --    < >  --  297 300 312   < > = values in this interval not displayed.   Lab Results  Component  Value Date   TSH 1.125 08/26/2017   Lab Results  Component Value Date   HGBA1C 5.6 12/07/2015   Lab Results  Component Value Date   CHOL 103 05/05/2017   HDL 39 (L) 05/05/2017   LDLCALC 43 05/05/2017   TRIG 129 05/05/2017   CHOLHDL 2.6 05/05/2017    Significant Diagnostic Results in last 30 days:  Dg Chest 2 View  Result Date: 11/11/2017 CLINICAL DATA:  Chest pain EXAM: CHEST - 2 VIEW COMPARISON:  08/25/2017 FINDINGS: Cardiac enlargement. Postop CABG and dual lead pacemaker. Negative for heart failure. Large hiatal hernia.  No acute skeletal abnormality. IMPRESSION: No acute abnormality.  Large hiatal hernia Electronically Signed   By: Franchot Gallo M.D.   On: 11/11/2017 14:36   Ct Chest W Contrast  Result Date: 11/11/2017 CLINICAL DATA:  84 year old male with history of lightheadedness and loss of appetite over the past 3 days. Productive cough with yellow sputum. No recent fever. Pounding chest pain. EXAM: CT CHEST, ABDOMEN, AND PELVIS WITH CONTRAST TECHNIQUE: Multidetector CT imaging of the chest, abdomen and pelvis was performed following the standard protocol during bolus administration of intravenous contrast. CONTRAST:  148mL OMNIPAQUE IOHEXOL 300 MG/ML  SOLN COMPARISON:  Chest CT 09/25/2012. CT the abdomen and pelvis 08/01/2016. FINDINGS: CT CHEST FINDINGS Cardiovascular: Heart size is mildly enlarged. There is no significant pericardial fluid, thickening or pericardial calcification. There is aortic atherosclerosis, as well as atherosclerosis of the great vessels of the mediastinum and the coronary arteries, including calcified atherosclerotic plaque in the left anterior descending, left circumflex and right coronary arteries. Status post median sternotomy for CABG including LIMA to the LAD. Severe calcifications of the aortic valve. Moderate calcifications of the mitral valve. Left-sided pacemaker device in place with lead tips terminating in the right atrium and right ventricle.  Mediastinum/Nodes: No pathologically enlarged mediastinal or hilar lymph nodes. Large hiatal hernia with predominantly intrathoracic stomach, with an appearance most compatible with or organoaxial volvulus, similar to prior study from 08/01/2016. No axillary lymphadenopathy. Lungs/Pleura: Areas of scarring and atelectasis are  noted in the lower lobes of the lungs bilaterally. Patchy ground-glass attenuation in the mid to lower lungs bilaterally, nonspecific, but favored to be of infectious or inflammatory etiology. No pleural effusions. No definite suspicious appearing pulmonary nodules or masses are noted. Musculoskeletal: Median sternotomy wires. There are no aggressive appearing lytic or blastic lesions noted in the visualized portions of the skeleton. CT ABDOMEN PELVIS FINDINGS Hepatobiliary: No suspicious cystic or solid hepatic lesions. No intra or extrahepatic biliary ductal dilatation. Gallbladder is normal in appearance. Pancreas: No pancreatic mass. No pancreatic ductal dilatation. No pancreatic or peripancreatic fluid or inflammatory changes. Spleen: Unremarkable. Adrenals/Urinary Tract: Multiple low-attenuation lesions are noted in the kidneys bilaterally, largest of which are compatible with simple cysts, measuring up to 5.1 cm in the medial aspect of the lower pole of the left kidney. Several subcentimeter low-attenuation lesions in both kidneys are too small to definitively characterize, but are favored to represent tiny cysts. Bilateral adrenal glands are normal in appearance. No hydroureteronephrosis. Urinary bladder is completely decompressed with an indwelling Foley balloon catheter in place. Stomach/Bowel: Intrathoracic stomach, as above. No pathologic dilatation of small bowel or colon. The appendix is not confidently identified and may be surgically absent. Regardless, there are no inflammatory changes noted adjacent to the cecum to suggest the presence of an acute appendicitis at this time.  Vascular/Lymphatic: Extensive aortic atherosclerosis with focal saccular aneurysm of the infrarenal abdominal aorta which measures up to 2.4 x 2.5 cm. No lymphadenopathy noted in the abdomen or pelvis. Reproductive: Prostate gland and seminal vesicles are unremarkable in appearance. Other: No significant volume of ascites.  No pneumoperitoneum. Musculoskeletal: There are no aggressive appearing lytic or blastic lesions noted in the visualized portions of the skeleton. IMPRESSION: 1. Patchy areas of ground-glass attenuation are noted in the lungs bilaterally, presumably of infectious or inflammatory etiology. Findings suggests mild multilobar bronchopneumonia. 2. No acute findings noted in the abdomen or pelvis. 3. Large hiatal hernia with what appears to be a chronic organoaxial gastric volvulus, similar to prior study from 08/01/2016. 4. Aortic atherosclerosis, in addition to 3 vessel coronary artery disease. Status post median sternotomy for CABG including LIMA to the LAD. There is also a focal saccular aneurysm of the infrarenal abdominal aorta which measures up to 2.4 x 2.5 cm. This is similar to prior study from 2018. 5. There are severe calcifications of the aortic valve and moderate calcifications of the mitral valve. Echocardiographic correlation for evaluation of potential valvular dysfunction may be warranted if clinically indicated. 6. Additional incidental findings, as above. Electronically Signed   By: Vinnie Langton M.D.   On: 11/11/2017 19:36   Ct Abdomen Pelvis W Contrast  Result Date: 11/11/2017 CLINICAL DATA:  84 year old male with history of lightheadedness and loss of appetite over the past 3 days. Productive cough with yellow sputum. No recent fever. Pounding chest pain. EXAM: CT CHEST, ABDOMEN, AND PELVIS WITH CONTRAST TECHNIQUE: Multidetector CT imaging of the chest, abdomen and pelvis was performed following the standard protocol during bolus administration of intravenous contrast.  CONTRAST:  156mL OMNIPAQUE IOHEXOL 300 MG/ML  SOLN COMPARISON:  Chest CT 09/25/2012. CT the abdomen and pelvis 08/01/2016. FINDINGS: CT CHEST FINDINGS Cardiovascular: Heart size is mildly enlarged. There is no significant pericardial fluid, thickening or pericardial calcification. There is aortic atherosclerosis, as well as atherosclerosis of the great vessels of the mediastinum and the coronary arteries, including calcified atherosclerotic plaque in the left anterior descending, left circumflex and right coronary arteries. Status post median sternotomy for  CABG including LIMA to the LAD. Severe calcifications of the aortic valve. Moderate calcifications of the mitral valve. Left-sided pacemaker device in place with lead tips terminating in the right atrium and right ventricle. Mediastinum/Nodes: No pathologically enlarged mediastinal or hilar lymph nodes. Large hiatal hernia with predominantly intrathoracic stomach, with an appearance most compatible with or organoaxial volvulus, similar to prior study from 08/01/2016. No axillary lymphadenopathy. Lungs/Pleura: Areas of scarring and atelectasis are noted in the lower lobes of the lungs bilaterally. Patchy ground-glass attenuation in the mid to lower lungs bilaterally, nonspecific, but favored to be of infectious or inflammatory etiology. No pleural effusions. No definite suspicious appearing pulmonary nodules or masses are noted. Musculoskeletal: Median sternotomy wires. There are no aggressive appearing lytic or blastic lesions noted in the visualized portions of the skeleton. CT ABDOMEN PELVIS FINDINGS Hepatobiliary: No suspicious cystic or solid hepatic lesions. No intra or extrahepatic biliary ductal dilatation. Gallbladder is normal in appearance. Pancreas: No pancreatic mass. No pancreatic ductal dilatation. No pancreatic or peripancreatic fluid or inflammatory changes. Spleen: Unremarkable. Adrenals/Urinary Tract: Multiple low-attenuation lesions are noted  in the kidneys bilaterally, largest of which are compatible with simple cysts, measuring up to 5.1 cm in the medial aspect of the lower pole of the left kidney. Several subcentimeter low-attenuation lesions in both kidneys are too small to definitively characterize, but are favored to represent tiny cysts. Bilateral adrenal glands are normal in appearance. No hydroureteronephrosis. Urinary bladder is completely decompressed with an indwelling Foley balloon catheter in place. Stomach/Bowel: Intrathoracic stomach, as above. No pathologic dilatation of small bowel or colon. The appendix is not confidently identified and may be surgically absent. Regardless, there are no inflammatory changes noted adjacent to the cecum to suggest the presence of an acute appendicitis at this time. Vascular/Lymphatic: Extensive aortic atherosclerosis with focal saccular aneurysm of the infrarenal abdominal aorta which measures up to 2.4 x 2.5 cm. No lymphadenopathy noted in the abdomen or pelvis. Reproductive: Prostate gland and seminal vesicles are unremarkable in appearance. Other: No significant volume of ascites.  No pneumoperitoneum. Musculoskeletal: There are no aggressive appearing lytic or blastic lesions noted in the visualized portions of the skeleton. IMPRESSION: 1. Patchy areas of ground-glass attenuation are noted in the lungs bilaterally, presumably of infectious or inflammatory etiology. Findings suggests mild multilobar bronchopneumonia. 2. No acute findings noted in the abdomen or pelvis. 3. Large hiatal hernia with what appears to be a chronic organoaxial gastric volvulus, similar to prior study from 08/01/2016. 4. Aortic atherosclerosis, in addition to 3 vessel coronary artery disease. Status post median sternotomy for CABG including LIMA to the LAD. There is also a focal saccular aneurysm of the infrarenal abdominal aorta which measures up to 2.4 x 2.5 cm. This is similar to prior study from 2018. 5. There are severe  calcifications of the aortic valve and moderate calcifications of the mitral valve. Echocardiographic correlation for evaluation of potential valvular dysfunction may be warranted if clinically indicated. 6. Additional incidental findings, as above. Electronically Signed   By: Vinnie Langton M.D.   On: 11/11/2017 19:36    Assessment/Plan 1. Hematuria, gross Afebrile.foley catheter draining bloody urine with suprapubic tenderness.Obtain urine specimen for U/A and C/S to rule out UTI. Encouraged fluid intake.Check CBC/diff   2. Suprapubic pain, acute Suprapubic tenderness to palpation.No bladder distention noted.suspect possible urinary tract infections.check urine specimen as above.  3. Chronic indwelling Foley catheter Foley catheter changed 11/23/2017.continue to change foley catheter per facility policy.  Family/ staff Communication: Reviewed plan of  care with patient and facility Nurse.  Labs/tests ordered: U/A and C/S to rule out UTI.Check CBC/diff    Sandrea Hughs, NP

## 2017-11-24 NOTE — ED Triage Notes (Signed)
Patient has had increasing penile pain x 2 days that is generalized to area. Patient has foley that was placed today. 5cc of bright red blood in foley then clear.

## 2017-11-24 NOTE — ED Notes (Signed)
Bed: TV39 Expected date:  Expected time:  Means of arrival:  Comments: 84 yr old hematuria

## 2017-11-25 ENCOUNTER — Encounter: Payer: Self-pay | Admitting: Family Medicine

## 2017-11-25 LAB — CBC WITH DIFFERENTIAL/PLATELET
Abs Immature Granulocytes: 0.07 10*3/uL (ref 0.00–0.07)
Basophils Absolute: 0.1 10*3/uL (ref 0.0–0.1)
Basophils Relative: 1 %
Eosinophils Absolute: 0.3 10*3/uL (ref 0.0–0.5)
Eosinophils Relative: 2 %
HCT: 39 % (ref 39.0–52.0)
Hemoglobin: 12.5 g/dL — ABNORMAL LOW (ref 13.0–17.0)
Immature Granulocytes: 1 %
Lymphocytes Relative: 14 %
Lymphs Abs: 1.7 10*3/uL (ref 0.7–4.0)
MCH: 31.5 pg (ref 26.0–34.0)
MCHC: 32.1 g/dL (ref 30.0–36.0)
MCV: 98.2 fL (ref 80.0–100.0)
Monocytes Absolute: 0.9 10*3/uL (ref 0.1–1.0)
Monocytes Relative: 8 %
Neutro Abs: 8.8 10*3/uL — ABNORMAL HIGH (ref 1.7–7.7)
Neutrophils Relative %: 74 %
Platelets: 284 10*3/uL (ref 150–400)
RBC: 3.97 MIL/uL — ABNORMAL LOW (ref 4.22–5.81)
RDW: 15.5 % (ref 11.5–15.5)
WBC: 11.8 10*3/uL — ABNORMAL HIGH (ref 4.0–10.5)
nRBC: 0 % (ref 0.0–0.2)

## 2017-11-25 LAB — URINALYSIS, ROUTINE W REFLEX MICROSCOPIC
Bilirubin Urine: NEGATIVE
Glucose, UA: NEGATIVE mg/dL
Ketones, ur: NEGATIVE mg/dL
Nitrite: NEGATIVE
Protein, ur: NEGATIVE mg/dL
RBC / HPF: >50 RBC/hpf — ABNORMAL HIGH (ref 0–5)
Specific Gravity, Urine: 1.011 (ref 1.005–1.030)
WBC, UA: >50 WBC/hpf — ABNORMAL HIGH (ref 0–5)
pH: 5 (ref 5.0–8.0)

## 2017-11-25 LAB — BASIC METABOLIC PANEL
Anion gap: 8 (ref 5–15)
BUN: 12 mg/dL (ref 8–23)
CO2: 26 mmol/L (ref 22–32)
Calcium: 8.6 mg/dL — ABNORMAL LOW (ref 8.9–10.3)
Chloride: 104 mmol/L (ref 98–111)
Creatinine, Ser: 0.65 mg/dL (ref 0.61–1.24)
GFR calc Af Amer: >60 mL/min (ref >60)
GFR calc non Af Amer: >60 mL/min (ref >60)
Glucose, Bld: 112 mg/dL — ABNORMAL HIGH (ref 70–99)
Potassium: 4.1 mmol/L (ref 3.5–5.1)
Sodium: 138 mmol/L (ref 135–145)

## 2017-11-25 MED ORDER — CIPROFLOXACIN HCL 500 MG PO TABS: 500.0000 mg | ORAL_TABLET | Freq: Two times a day (BID) | ORAL | 0 refills | Status: DC

## 2017-11-25 MED ORDER — CIPROFLOXACIN HCL 500 MG PO TABS
500.0000 mg | ORAL_TABLET | Freq: Once | ORAL | Status: AC
  Administered 2017-11-25: 500 mg via ORAL
  Filled 2017-11-25: qty 1

## 2017-11-25 MED ORDER — PHENAZOPYRIDINE HCL 200 MG PO TABS: 200.0000 mg | ORAL_TABLET | Freq: Three times a day (TID) | ORAL | 0 refills | Status: DC

## 2017-11-25 MED ORDER — KETOROLAC TROMETHAMINE 15 MG/ML IJ SOLN
15.0000 mg | Freq: Once | INTRAMUSCULAR | Status: AC
  Administered 2017-11-25: 15 mg via INTRAVENOUS
  Filled 2017-11-25: qty 1

## 2017-11-25 MED ORDER — ACETAMINOPHEN 325 MG PO TABS
650.0000 mg | ORAL_TABLET | Freq: Once | ORAL | Status: AC
  Administered 2017-11-25: 650 mg via ORAL
  Filled 2017-11-25: qty 2

## 2017-11-25 MED ORDER — IBUPROFEN 400 MG PO TABS: 400.0000 mg | ORAL_TABLET | Freq: Two times a day (BID) | ORAL | 0 refills | Status: DC | PRN

## 2017-11-25 MED ORDER — ACETAMINOPHEN ER 650 MG PO TBCR: 650.0000 mg | EXTENDED_RELEASE_TABLET | Freq: Three times a day (TID) | ORAL | 0 refills | Status: DC

## 2017-11-25 NOTE — Discharge Instructions (Addendum)
We signed the ER for bloody urine and pain in the scrotal and penile region. Urine test here does show evidence of infection.  We are sending you home with ciprofloxacin for the infection. Additionally, we are sending you home with ibuprofen that can be taken up to twice a day, Tylenol that can be taken up to 4 times a day and a medicine called Pyridium which might help you with spasms that can be taken for the next 3 days.  Please note that the Pyridium might change her urine color. Please return to the ER if your symptoms worsen; you have increased pain, fevers, chills, inability to keep any medications down, confusion.

## 2017-11-25 NOTE — ED Provider Notes (Signed)
[facility] DEPT Provider Note   CSN: [id number] Arrival date & time: 11/24/17  2240     History   Chief Complaint Chief Complaint  Patient presents with  . Penis Pain    HPI Adam [name] is a 84 y.o. male.  HPI  84 year old male comes in with chief complaint of lower quadrant abdominal and scrotal pain. Patient has history of CAD, CHF and urinary retention with chronic indwelling Foley catheter in place.  Patient reports that about 3 days ago he started having pain in his penis and scrotal region.  Over time the pain has gotten worse and yesterday he was not able to sleep well.  Patient is unable to explain the pain properly, however he states that his pain is similar to prior UTIs.  His Foley catheter was just changed 3 days ago and then again earlier today -and he notices it that there is now blood in the urine.  Patient denies any flank pain, nausea, vomiting, fevers, chills, confusion.  Patient was seen in the ER 2 weeks ago.  At that time he had a CT abdomen and pelvis done which showed a volvulus along with pneumonia.  Patient is no longer antibiotics.   Past Medical History:  Diagnosis Date  . Arthritis    "minor, back and sometimes knees" (12/15/2012)  . Bradycardia    AFib/SSS s/p St Jude PPM 04/12/2008  . CAD (coronary artery disease) 12/30/2014   CABG (LIMA-LAD, SVG-RCA, SVG-OM in 1996).  07/2009 BMS to SVG-RCA. Cath in 04/2010 with patent stents   . Cardiomyopathy, ischemic 08/25/2012  . CHF (congestive heart failure) ([place])   . Chronic knee pain 12/03/2014  . Combined congestive systolic and diastolic heart failure ([place]) 02/02/2015   Hx EF 41%. BNP 96.8 02/21/15 Torsemide 04/06/15 Na 142, K 4.6, Bun 16, creat 0.89 04/20/14 BNP 111.7, Na 142, K 4.6, Bun 16, creat 0.9   . Depression with anxiety 02/02/2015   02/21/15 Hgb A1c 5.8 03/10/15 MMSE 30/30   . Dizziness, after diuretic asscoiated with hypotension and responded to fluid bolus 06/05/2011     04/28/15 US carotid R+L normal bilateral arterial velocities.    Marland Kitchen Dyspnea 08/11/2014   Followed in Pulmonary clinic/ [facility]/ [place]  - 08/11/2014  Walked RA x 1 laps @ 185 ft each stopped due to fatigue/off balance/ slow pace/  no sob or desat  - PFT's  09/26/2014  FEV1 2.26 (85 % ) ratio 76  p no % improvement from saba with DLCO  67 % corrects to 93 % for alv volume      Since prev study 08/04/13 minimal change lung vol or dlco    . Embolic cerebral infarction ([place]) 12/06/2015  . Exertional shortness of breath    "sometimes walking" (12/15/2012)  . GERD (gastroesophageal reflux disease)   . Gout 02/09/2015  . Heart murmur    "just told I had one today" (12/15/2012)  . Hiatal hernia   . Hyperlipidemia   . Hypertension   . Hypothyroid   . Influenza A 03/10/2016  . Insomnia   . Melanoma of back ([place]) 1976  . Myocardial infarction [facility]) 1996; 2011   "both silent" (12/15/2012)  . Nonrheumatic aortic valve stenosis   . Orthostatic hypotension   . Osteoporosis, senile   . Pacemaker   . RBBB   . Restless leg 02/02/2015  . Right leg weakness 12/06/2015  . [facility] spotted fever   . S/P CABG x 4   .  Sick sinus syndrome ([place]) 01/31/2014  . Sustained ventricular tachycardia ([place]) 07/27/2014  . Urinary retention 12/30/2014    Patient Active Problem List   Diagnosis Date Noted  . Palpitations   . Coronary artery disease involving autologous vein coronary bypass graft without angina pectoris 11/08/2017  . Spondylosis of cervical region without myelopathy or radiculopathy 09/25/2017  . Tear of left rotator cuff 09/25/2017  . Pharyngeal dysphagia 09/08/2017  . Bacteremia   . Generalized weakness 08/25/2017  . Shoulder pain, bilateral 08/25/2017  . Neurogenic bladder 07/21/2017  . Mild cognitive impairment 07/21/2017  . Neurogenic orthostatic hypotension ([place]) 04/21/2017  . Decubitus ulcer of sacral region, stage 2 ([place]) 06/11/2016  . Osteoarthritis 04/23/2016  . Gingivitis  04/15/2016  . Weak 03/14/2016  . AKI (acute kidney injury) ([place]) 03/11/2016  . History of arterial ischemic stroke 03/05/2016  . Rash and nonspecific skin eruption 02/27/2016  . Nonrheumatic aortic valve stenosis   . TIA (transient ischemic attack) 01/10/2016  . Hx of CABG 01/10/2016  . Urinary tract infection, acute 04/06/2015  . Insomnia 03/23/2015  . Gout 02/09/2015  . Depression with anxiety 02/02/2015  . RLS (restless legs syndrome) 02/02/2015  . Combined congestive systolic and diastolic heart failure ([place]) 02/02/2015  . Urinary frequency 02/02/2015  . Pressure ulcer 01/01/2015  . Urinary retention 12/30/2014  . Coronary artery disease involving native coronary artery of native heart without angina pectoris 12/30/2014  . Expressive aphasia 12/30/2014  . Chronic constipation   . Orthostatic hypotension   . Chronic knee pain 12/03/2014  . GERD (gastroesophageal reflux disease)   . Dyspnea on exertion 08/11/2014  . Hypothyroidism 08/11/2014  . VT (ventricular tachycardia) ([place]) 07/27/2014  . Upper airway cough syndrome 07/14/2014  . SSS (sick sinus syndrome) ([place]) 01/31/2014  . Hyperlipidemia 01/30/2014  . Cardiomyopathy, ischemic 08/25/2012  . Pacemaker 08/25/2012  . Dizzy 06/05/2011  . Bradycardia     Past Surgical History:  Procedure Laterality Date  . CARDIAC CATHETERIZATION  04/2010   LIMA to LAD patent,SVG to OM patent,no in-stnet restenosis RCA  . CATARACT EXTRACTION W/ INTRAOCULAR LENS  IMPLANT, BILATERAL Bilateral 2012  . CORONARY ANGIOPLASTY WITH STENT PLACEMENT  07/2009   bare metal stent to SVG to the RCA  . CORONARY ARTERY BYPASS GRAFT  1996   LIMA to LAD,SVG to RCA & SVG to OM  . INSERT / REPLACE / REMOVE PACEMAKER  2010  . MELANOMA EXCISION  05/1974 X2   "taken off my back" (12/15/2012)  . NM MYOVIEW LTD  06/2011   low risk  . PPM GENERATOR CHANGEOUT N/A 01/22/2017   Procedure: PPM GENERATOR CHANGEOUT;  Surgeon: [name], MD;  Location: [facility];  Service: Cardiovascular;  Laterality: N/A;  . TONSILLECTOMY  1938  . TRANSURETHRAL RESECTION OF PROSTATE  1986  . US ECHOCARDIOGRAPHY  07/11/2009   EF 45-50%        Home Medications    Prior to Admission medications   Medication Sig Start Date End Date Taking? Authorizing Provider  acetaminophen (TYLENOL 8 HOUR) 650 MG CR tablet Take 1 tablet (650 mg total) by mouth every 8 (eight) hours. 11/25/17   Varney Biles, MD  Amino Acids-Protein Hydrolys (FEEDING SUPPLEMENT, PRO-STAT SUGAR FREE 64,) LIQD Take 30 mLs by mouth daily.    [provider]  amiodarone (PACERONE) 100 MG tablet Take 1 tablet (100 mg total) by mouth daily. 01/16/17   Croitoru, Mihai, MD  aspirin EC 81 MG tablet Take 1 tablet (81 mg  total) by mouth daily. 01/12/16   Reyne Dumas, MD  Cholecalciferol (VITAMIN D3) 5000 UNITS CAPS Take 5,000 Units by mouth every Monday.     [provider]  ciprofloxacin (CIPRO) 500 MG tablet Take 1 tablet (500 mg total) by mouth every 12 (twelve) hours. 11/25/17   Varney Biles, MD  Clobetasol Propionate (CORMAX SCALP APPLICATION EX) Apply 1 application topically See admin instructions. Apply to affected areas of the scalp 2 times a day and as needed/avoid face, groin, and axillae    [provider]  clopidogrel (PLAVIX) 75 MG tablet TAKE 1 TABLET ONCE DAILY. Patient taking differently: Take 75 mg by mouth daily.  06/13/17   Blanchie Serve, MD  doxycycline (VIBRAMYCIN) 100 MG capsule Take 1 capsule (100 mg total) by mouth 2 (two) times daily. Patient not taking: Reported on 11/24/2017 11/11/17   Ocie Cornfield T, PA-C  Droxidopa 100 MG CAPS Take 100 mg by mouth 3 (three) times daily. 08/12/17   Croitoru, Mihai, MD  guaiFENesin (ROBITUSSIN) 100 MG/5ML SOLN Take 5 mLs (100 mg total) by mouth every 4 (four) hours as needed for cough or to loosen phlegm. 09/04/17   Lavina Hamman, MD  HYDROcodone-acetaminophen (NORCO/VICODIN) 5-325 MG tablet Take 1  tablet by mouth every 4 (four) hours as needed for severe pain. Patient taking differently: Take 1 tablet by mouth 2 (two) times daily. Take 1 tablet by mouth two times a day and 1-2 tablets every four hours as needed for moderate to sever pain/NOT TO EXCEED 3,000 MG FROM ALL SOURCES IN A 24-HR PERIOD 09/04/17   Lavina Hamman, MD  ibuprofen (ADVIL,MOTRIN) 400 MG tablet Take 1 tablet (400 mg total) by mouth every 12 (twelve) hours as needed for up to 5 days. 11/25/17 11/30/17  Varney Biles, MD  levothyroxine (SYNTHROID, LEVOTHROID) 100 MCG tablet Take 100 mcg by mouth daily before breakfast.    [provider]  Lidocaine 4 % PTCH Apply 1 patch topically daily. Left shoulder    [provider]  meclizine (ANTIVERT) 25 MG tablet Take 1 tablet (25 mg total) by mouth 2 (two) times daily as needed for dizziness. 09/04/17   Lavina Hamman, MD  Melatonin 3 MG TABS Take 3 mg by mouth at bedtime.    [provider]  methocarbamol (ROBAXIN) 500 MG tablet Take 250 mg by mouth 2 (two) times daily.     [provider]  midodrine (PROAMATINE) 5 MG tablet Take 1 tablet (5 mg total) by mouth 3 (three) times daily. 11/03/17   Croitoru, Mihai, MD  Multiple Vitamins-Minerals (DECUBI-VITE) CAPS Take 1 capsule by mouth daily.    [provider]  NONFORMULARY OR COMPOUNDED ITEM Nizoral 2% cream mixed with Cutivate 0.05% 1:1 ratio: Apply to affected areas of the buttocks 2 times a day and as needed for rash    [provider]  pantoprazole (PROTONIX) 40 MG tablet TAKE 1 TABLET BY MOUTH TWICE DAILY. 02/28/17   Erlene Quan, PA-C  phenazopyridine (PYRIDIUM) 200 MG tablet Take 1 tablet (200 mg total) by mouth 3 (three) times daily. 11/25/17   Varney Biles, MD  polyethylene glycol (MIRALAX / GLYCOLAX) packet Take 17 g by mouth daily. Hold for loose stool    [provider]  pravastatin (PRAVACHOL) 40 MG tablet Take 1 tablet (40 mg total) by mouth at bedtime.  10/21/16   Blanchie Serve, MD  rOPINIRole (REQUIP) 0.5 MG tablet TAKE 1 TABLET 3 TIMES A DAY AS NEEDED. Patient taking  differently: Take 0.5 mg by mouth 3 (three) times daily as needed (for restless legs syndrome).  07/08/17   Blanchie Serve, MD  sennosides-docusate sodium (SENOKOT-S) 8.6-50 MG tablet Take 2 tablets by mouth at bedtime.     [provider]  torsemide (DEMADEX) 5 MG tablet Take 5 mg by mouth daily.    [provider]  traZODone (DESYREL) 50 MG tablet Take 1 tablet (50 mg total) by mouth at bedtime. 05/19/17   Blanchie Serve, MD  ZINC OXIDE, TOPICAL, 10 % CREA Apply 1 application topically See admin instructions. Apply to buttocks every shift and as needed for redness    [provider]    Family History Family History  Problem Relation Age of Onset  . Coronary artery disease Mother   . Diabetes Mother   . Heart disease Mother   . Coronary artery disease Father   . Diabetes Father   . Lung cancer Father   . Heart disease Father   . Anuerysm Son   . Heart disease Brother     Social History Social History   Tobacco Use  . Smoking status: Never Smoker  . Smokeless tobacco: Never Used  Substance Use Topics  . Alcohol use: No    Alcohol/week: 0.0 standard drinks  . Drug use: No     Allergies   Altace [ramipril]; Crestor [rosuvastatin calcium]; Penicillins; Sulfa antibiotics; and Sulfamethoxazole-trimethoprim   Review of Systems Review of Systems  Constitutional: Positive for activity change.  Respiratory: Negative for cough and shortness of breath.   Cardiovascular: Negative for chest pain.  Gastrointestinal: Positive for abdominal pain. Negative for nausea and vomiting.  Allergic/Immunologic: Negative for immunocompromised state.  Hematological: Does not bruise/bleed easily.  All other systems reviewed and are negative.    Physical Exam Updated Vital Signs BP (!) 155/82   Pulse 74   Temp 98.1 F (36.7 C) (Oral)   Resp 19    Ht 5\' 10"  (1.778 m)   Wt 77.1 kg   SpO2 91%   BMI 24.39 kg/m   Physical Exam  Constitutional: He is oriented to person, place, and time. He appears well-developed.  HENT:  Head: Atraumatic.  Neck: Neck supple.  Cardiovascular: Normal rate.  Pulmonary/Chest: Effort normal.  Abdominal: Soft. There is tenderness.  Patient is noted to have tenderness over the lower quadrants and left lower quadrant of the abdomen. No flank tenderness.  Genitourinary:  Genitourinary Comments: Scrotum appears erythematous, however there is no effusion or tenderness to palpation of the testicles.  Testicles are freely moving. No blood at the meatus, and there is an indwelling Foley catheter in place.  Neurological: He is alert and oriented to person, place, and time.  Skin: Skin is warm.  Nursing note and vitals reviewed.    ED Treatments / Results  Labs (all labs ordered are listed, but only abnormal results are displayed) Labs Reviewed  CBC WITH DIFFERENTIAL/PLATELET - Abnormal; Notable for the following components:      Result Value   WBC 11.8 (*)    RBC 3.97 (*)    Hemoglobin 12.5 (*)    Neutro Abs 8.8 (*)    All other components within normal limits  BASIC METABOLIC PANEL - Abnormal; Notable for the following components:   Glucose, Bld 112 (*)    Calcium 8.6 (*)    All other components within normal limits  URINALYSIS, ROUTINE W REFLEX MICROSCOPIC - Abnormal; Notable for the following components:   APPearance HAZY (*)  Hgb urine dipstick LARGE (*)    Leukocytes, UA LARGE (*)    RBC / HPF >50 (*)    WBC, UA >50 (*)    Bacteria, UA RARE (*)    All other components within normal limits  URINE CULTURE    EKG None  Radiology Dg Chest 2 View  Result Date: 11/24/2017 CLINICAL DATA:  Hematuria. Patient reports penile pain. Evaluate for pneumonia. EXAM: CHEST - 2 VIEW COMPARISON:  Radiographs and CT 11/11/2017 FINDINGS: Left-sided pacemaker remains in place. Post median sternotomy  with unchanged cardiomegaly. Large retrocardiac hiatal hernia again seen. Ground-glass opacities on prior chest CT are not seen radiographically. No confluent airspace disease. No pulmonary edema or pleural effusion. No acute osseous abnormalities. IMPRESSION: 1. No radiographic evidence of pneumonia. 2. Unchanged cardiomegaly and large retrocardiac hiatal hernia. Electronically Signed   By: Keith Rake M.D.   On: 11/24/2017 23:52    Procedures Procedures (including critical care time)  Medications Ordered in ED Medications  fentaNYL (SUBLIMAZE) injection 50 mcg (50 mcg Intravenous Given 11/25/17 0017)  sodium chloride 0.9 % bolus 500 mL (0 mLs Intravenous Stopped 11/25/17 0212)  ciprofloxacin (CIPRO) tablet 500 mg (500 mg Oral Given 11/25/17 0204)  ketorolac (TORADOL) 15 MG/ML injection 15 mg (15 mg Intravenous Given 11/25/17 0204)  acetaminophen (TYLENOL) tablet 650 mg (650 mg Oral Given 11/25/17 0204)     Initial Impression / Assessment and Plan / ED Course  I have reviewed the triage vital signs and the nursing notes.  Pertinent labs & imaging results that were available during my care of the patient were reviewed by me and considered in my medical decision making (see chart for details).     84 year old male comes in with chief complaint of scrotal and penile pain.  He is also having pain in the lower quadrants of his abdomen.  Additionally, he is having gross hematuria.  Patient has chronic indwelling Foley catheter in place, and he had similar symptoms in the past because of UTI.  There is no associated nausea, vomiting, fevers, chills, flank pain or confusion.  Patient is afebrile and does not have any sirs criteria.  Foley catheter was just replaced earlier today.  UA is concerning for infection, we will start him on ceftriaxone and treat him for cystitis.  Strict ER return precautions have been discussed with the patient and his family, and they are all in agreement with the  current plan and the return precautions.  Final Clinical Impressions(s) / ED Diagnoses   Final diagnoses:  Cystitis    ED Discharge Orders         Ordered    phenazopyridine (PYRIDIUM) 200 MG tablet  3 times daily     11/25/17 0215    ibuprofen (ADVIL,MOTRIN) 400 MG tablet  Every 12 hours PRN     11/25/17 0215    acetaminophen (TYLENOL 8 HOUR) 650 MG CR tablet  Every 8 hours     11/25/17 0215    ciprofloxacin (CIPRO) 500 MG tablet  Every 12 hours     11/25/17 0215           Varney Biles, MD 11/25/17 0160

## 2017-11-25 NOTE — Progress Notes (Signed)
Opened in error; Disregard.

## 2017-11-26 LAB — URINE CULTURE: Culture: <10000 — AB

## 2017-11-29 ENCOUNTER — Other Ambulatory Visit: Payer: Self-pay

## 2017-11-29 ENCOUNTER — Encounter (HOSPITAL_COMMUNITY): Payer: Self-pay

## 2017-11-29 ENCOUNTER — Emergency Department (HOSPITAL_COMMUNITY)
Admission: EM | Admit: 2017-11-29 | Discharge: 2017-11-29 | Disposition: A | Discharge status: Nursing Home (Includes ICF) | Payer: Medicare Other | Attending: Emergency Medicine | Admitting: Emergency Medicine

## 2017-11-29 DIAGNOSIS — Z951 Presence of aortocoronary bypass graft: Secondary | ICD-10-CM | POA: Diagnosis not present

## 2017-11-29 DIAGNOSIS — Z7902 Long term (current) use of antithrombotics/antiplatelets: Secondary | ICD-10-CM | POA: Insufficient documentation

## 2017-11-29 DIAGNOSIS — Z7982 Long term (current) use of aspirin: Secondary | ICD-10-CM | POA: Insufficient documentation

## 2017-11-29 DIAGNOSIS — I110 Hypertensive heart disease with heart failure: Secondary | ICD-10-CM | POA: Insufficient documentation

## 2017-11-29 DIAGNOSIS — Z950 Presence of cardiac pacemaker: Secondary | ICD-10-CM | POA: Diagnosis not present

## 2017-11-29 DIAGNOSIS — R600 Localized edema: Secondary | ICD-10-CM | POA: Insufficient documentation

## 2017-11-29 DIAGNOSIS — I5040 Unspecified combined systolic (congestive) and diastolic (congestive) heart failure: Secondary | ICD-10-CM | POA: Diagnosis not present

## 2017-11-29 DIAGNOSIS — Z85820 Personal history of malignant melanoma of skin: Secondary | ICD-10-CM | POA: Diagnosis not present

## 2017-11-29 DIAGNOSIS — E039 Hypothyroidism, unspecified: Secondary | ICD-10-CM | POA: Insufficient documentation

## 2017-11-29 DIAGNOSIS — I2510 Atherosclerotic heart disease of native coronary artery without angina pectoris: Secondary | ICD-10-CM | POA: Diagnosis not present

## 2017-11-29 DIAGNOSIS — Z79899 Other long term (current) drug therapy: Secondary | ICD-10-CM | POA: Insufficient documentation

## 2017-11-29 DIAGNOSIS — R339 Retention of urine, unspecified: Secondary | ICD-10-CM | POA: Insufficient documentation

## 2017-11-29 DIAGNOSIS — Z955 Presence of coronary angioplasty implant and graft: Secondary | ICD-10-CM | POA: Diagnosis not present

## 2017-11-29 LAB — URINALYSIS, ROUTINE W REFLEX MICROSCOPIC
Bacteria, UA: NONE SEEN
Bilirubin Urine: NEGATIVE
Glucose, UA: NEGATIVE mg/dL
Ketones, ur: NEGATIVE mg/dL
Nitrite: POSITIVE — AB
Protein, ur: NEGATIVE mg/dL
RBC / HPF: >50 RBC/hpf — ABNORMAL HIGH (ref 0–5)
Specific Gravity, Urine: 1.015 (ref 1.005–1.030)
pH: 6 (ref 5.0–8.0)

## 2017-11-29 MED ORDER — LIDOCAINE HCL URETHRAL/MUCOSAL 2 % EX GEL
1.0000 "application " | Freq: Once | CUTANEOUS | Status: AC | PRN
  Administered 2017-11-29: 1 via URETHRAL
  Filled 2017-11-29: qty 5

## 2017-11-29 NOTE — ED Notes (Signed)
Pt notified that [facility] is en route to transport him back to [facility]

## 2017-11-29 NOTE — ED Notes (Signed)
Called [facility] for transport.  

## 2017-11-29 NOTE — ED Triage Notes (Signed)
Per EMS, patient coming from SNF with catheter issues. Nurse at SNF was not able to advance the catheter. When attempting to advance catheter, nurse got about 100 CC of bloody urine. Have not gotten any urine other than the 100 CC since 1900.  Patient is complaining of pain in his genitals.

## 2017-11-29 NOTE — ED Notes (Signed)
Pt family at bedside

## 2017-11-29 NOTE — ED Provider Notes (Signed)
[facility] DEPT Provider Note: Georgena Spurling, MD, FACEP  CSN: [id number] MRN: [id number] ARRIVAL: 11/29/17 at [place]: [place]  Urinary Retention   HISTORY OF PRESENT ILLNESS  11/29/17 6:08 AM Adam [name] is a 84 y.o. male who was sent from a skilled nursing facility after they attempted to change his Foley.  They removed his old Foley and on placement of a new Foley only got about 100 mL of grossly bloody fluid.  They were unable to empty his bladder and he complains of a moderate sensation of bladder fullness.  On arrival here nurses did a bedside bladder scan and found him to have 980 mL of retained urine.  He has no other compute complaint.    Past Medical History:  Diagnosis Date  . Arthritis    "minor, back and sometimes knees" (12/15/2012)  . Bradycardia    AFib/SSS s/p [employer] PPM 04/12/2008  . CAD (coronary artery disease) 12/30/2014   CABG (LIMA-LAD, SVG-RCA, SVG-OM in 1996).  07/2009 BMS to SVG-RCA. Cath in 04/2010 with patent stents   . Cardiomyopathy, ischemic 08/25/2012  . CHF (congestive heart failure) ([place])   . Chronic knee pain 12/03/2014  . Combined congestive systolic and diastolic heart failure ([place]) 02/02/2015   Hx EF 41%. BNP 96.8 02/21/15 Torsemide 04/06/15 Na 142, K 4.6, Bun 16, creat 0.89 04/20/14 BNP 111.7, Na 142, K 4.6, Bun 16, creat 0.9   . Depression with anxiety 02/02/2015   02/21/15 Hgb A1c 5.8 03/10/15 MMSE 30/30   . Dizziness, after diuretic asscoiated with hypotension and responded to fluid bolus 06/05/2011   04/28/15 US carotid R+L normal bilateral arterial velocities.    Marland Kitchen Dyspnea 08/11/2014   Followed in Pulmonary clinic/ [facility]/ [place]  - 08/11/2014  Walked RA x 1 laps @ 185 ft each stopped due to fatigue/off balance/ slow pace/  no sob or desat  - PFT's  09/26/2014  FEV1 2.26 (85 % ) ratio 76  p no % improvement from saba with DLCO  67 % corrects to 93 % for alv volume      Since prev study 08/04/13 minimal change lung vol  or dlco    . Embolic cerebral infarction ([place]) 12/06/2015  . Exertional shortness of breath    "sometimes walking" (12/15/2012)  . GERD (gastroesophageal reflux disease)   . Gout 02/09/2015  . Heart murmur    "just told I had one today" (12/15/2012)  . Hiatal hernia   . Hyperlipidemia   . Hypertension   . Hypothyroid   . Influenza A 03/10/2016  . Insomnia   . Melanoma of back ([place]) 1976  . Myocardial infarction [facility]) 1996; 2011   "both silent" (12/15/2012)  . Nonrheumatic aortic valve stenosis   . Orthostatic hypotension   . Osteoporosis, senile   . Pacemaker   . RBBB   . Restless leg 02/02/2015  . Right leg weakness 12/06/2015  . [facility] spotted fever   . S/P CABG x 4   . Sick sinus syndrome ([place]) 01/31/2014  . Sustained ventricular tachycardia ([place]) 07/27/2014  . Urinary retention 12/30/2014    Past Surgical History:  Procedure Laterality Date  . CARDIAC CATHETERIZATION  04/2010   LIMA to LAD patent,SVG to OM patent,no in-stnet restenosis RCA  . CATARACT EXTRACTION W/ INTRAOCULAR LENS  IMPLANT, BILATERAL Bilateral 2012  . CORONARY ANGIOPLASTY WITH STENT PLACEMENT  07/2009   bare metal stent to SVG to the RCA  . CORONARY ARTERY BYPASS  GRAFT  1996   LIMA to LAD,SVG to RCA & SVG to OM  . INSERT / REPLACE / REMOVE PACEMAKER  2010  . MELANOMA EXCISION  05/1974 X2   "taken off my back" (12/15/2012)  . NM MYOVIEW LTD  06/2011   low risk  . PPM GENERATOR CHANGEOUT N/A 01/22/2017   Procedure: PPM GENERATOR CHANGEOUT;  Surgeon: [name], MD;  Location: [facility];  Service: Cardiovascular;  Laterality: N/A;  . TONSILLECTOMY  1938  . TRANSURETHRAL RESECTION OF PROSTATE  1986  . US ECHOCARDIOGRAPHY  07/11/2009   EF 45-50%    Family History  Problem Relation Age of Onset  . Coronary artery disease Mother   . Diabetes Mother   . Heart disease Mother   . Coronary artery disease Father   . Diabetes Father   . Lung cancer Father   . Heart disease Father   .  Anuerysm Son   . Heart disease Brother     Social History   Tobacco Use  . Smoking status: Never Smoker  . Smokeless tobacco: Never Used  Substance Use Topics  . Alcohol use: No    Alcohol/week: 0.0 standard drinks  . Drug use: No    Prior to Admission medications   Medication Sig Start Date End Date Taking? Authorizing Provider  acetaminophen (TYLENOL) 500 MG tablet Take 500 mg by mouth every 8 (eight) hours as needed for mild pain.   Yes [provider]  Amino Acids-Protein Hydrolys (FEEDING SUPPLEMENT, PRO-STAT SUGAR FREE 64,) LIQD Take 30 mLs by mouth daily.   Yes [provider]  amiodarone (PACERONE) 100 MG tablet Take 1 tablet (100 mg total) by mouth daily. 01/16/17  Yes Croitoru, Mihai, MD  aspirin EC 81 MG tablet Take 1 tablet (81 mg total) by mouth daily. 01/12/16  Yes Reyne Dumas, MD  Cholecalciferol (VITAMIN D3) 5000 UNITS CAPS Take 5,000 Units by mouth every Monday.    Yes [provider]  ciprofloxacin (CIPRO) 500 MG tablet Take 1 tablet (500 mg total) by mouth every 12 (twelve) hours. 11/25/17  Yes Varney Biles, MD  clopidogrel (PLAVIX) 75 MG tablet TAKE 1 TABLET ONCE DAILY. 06/13/17  Yes Blanchie Serve, MD  Droxidopa 100 MG CAPS Take 100 mg by mouth 3 (three) times daily. 08/12/17  Yes Croitoru, Mihai, MD  guaiFENesin (ROBITUSSIN) 100 MG/5ML SOLN Take 5 mLs (100 mg total) by mouth every 4 (four) hours as needed for cough or to loosen phlegm. 09/04/17  Yes Lavina Hamman, MD  HYDROcodone-acetaminophen (NORCO/VICODIN) 5-325 MG tablet Take 1-2 tablets by mouth every 4 (four) hours as needed.    Yes [provider]  ibuprofen (ADVIL,MOTRIN) 200 MG tablet Take 400 mg by mouth every 12 (twelve) hours as needed. 11/25/17 11/29/17 Yes [provider]  levothyroxine (SYNTHROID, LEVOTHROID) 100 MCG tablet Take 100 mcg by mouth daily before breakfast.   Yes [provider]  Lidocaine 4 % PTCH Apply 1 patch topically daily. Left  shoulder   Yes [provider]  meclizine (ANTIVERT) 25 MG tablet Take 1 tablet (25 mg total) by mouth 2 (two) times daily as needed for dizziness. 09/04/17  Yes Lavina Hamman, MD  Melatonin 3 MG TABS Take 3 mg by mouth at bedtime.   Yes [provider]  methocarbamol (ROBAXIN) 500 MG tablet Take 250 mg by mouth 2 (two) times daily.    Yes [provider]  midodrine (PROAMATINE) 5 MG tablet Take 1 tablet (5 mg  total) by mouth 3 (three) times daily. 11/03/17  Yes Croitoru, Mihai, MD  Multiple Vitamins-Minerals (DECUBI-VITE) CAPS Take 1 capsule by mouth daily.   Yes [provider]  NONFORMULARY OR COMPOUNDED ITEM Nizoral 2% cream mixed with Cutivate 0.05% 1:1 ratio: Apply to affected areas of the buttocks 2 times a day and as needed for rash   Yes [provider]  pantoprazole (PROTONIX) 40 MG tablet TAKE 1 TABLET BY MOUTH TWICE DAILY. Patient taking differently: Take 40 mg by mouth 2 (two) times daily.  02/28/17  Yes Kilroy, Luke K, PA-C  phenazopyridine (PYRIDIUM) 200 MG tablet Take 1 tablet (200 mg total) by mouth 3 (three) times daily. 11/25/17  Yes Nanavati, Ankit, MD  polyethylene glycol (MIRALAX / GLYCOLAX) packet Take 17 g by mouth daily. Hold for loose stool   Yes [provider]  pravastatin (PRAVACHOL) 40 MG tablet Take 1 tablet (40 mg total) by mouth at bedtime. 10/21/16  Yes Pandey, Mahima, MD  rOPINIRole (REQUIP) 0.5 MG tablet TAKE 1 TABLET 3 TIMES A DAY AS NEEDED. Patient taking differently: Take 0.5 mg by mouth 3 (three) times daily.  07/08/17  Yes Blanchie Serve, MD  saccharomyces boulardii (FLORASTOR) 250 MG capsule Take 250 mg by mouth 2 (two) times daily. 11/25/17 12/04/17 Yes [provider]  sennosides-docusate sodium (SENOKOT-S) 8.6-50 MG tablet Take 2 tablets by mouth at bedtime.    Yes [provider]  torsemide (DEMADEX) 5 MG tablet Take 5 mg by mouth daily.   Yes [provider]  traZODone (DESYREL)  50 MG tablet Take 1 tablet (50 mg total) by mouth at bedtime. 05/19/17  Yes Blanchie Serve, MD    Allergies Altace [ramipril]; Crestor [rosuvastatin calcium]; Penicillins; Sulfa antibiotics; and Sulfamethoxazole-trimethoprim   REVIEW OF SYSTEMS  Negative except as noted here or in the History of Present Illness.   PHYSICAL EXAMINATION  Initial Vital Signs Blood pressure 132/66, pulse 70, temperature 97.7 F (36.5 C), temperature source Oral, resp. rate 15, SpO2 95 %.  Examination General: Well-developed, well-nourished male in no acute distress; appearance consistent with age of record HENT: normocephalic; atraumatic Eyes: pupils equal, round and reactive to light; extraocular muscles intact Neck: supple Heart: regular rate and rhythm Lungs: clear to auscultation bilaterally Abdomen: soft; tender, distended bladder; bowel sounds present GU: Tanner V male, uncircumcised; Foley catheter in place draining a small amount of grossly bloody fluid Extremities: No deformity; full range of motion; pulses normal; trace edema of lower legs Neurologic: Awake, alert; motor function intact in all extremities and symmetric; no facial droop; hard of hearing Skin: Warm and dry Psychiatric: Normal mood and affect   RESULTS  Summary of this visit's results, reviewed by myself:   EKG Interpretation  Date/Time:    Ventricular Rate:    PR Interval:    QRS Duration:   QT Interval:    QTC Calculation:   R Axis:     Text Interpretation:        Laboratory Studies: Results for orders placed or performed during the hospital encounter of 11/29/17 (from the past 24 hour(s))  Urinalysis, Routine w reflex microscopic- may I&O cath if menses     Status: Abnormal   Collection Time: 11/29/17  6:39 AM  Result Value Ref Range   Color, Urine AMBER (A) YELLOW   APPearance CLEAR CLEAR   Specific Gravity, Urine 1.015 1.005 - 1.030   pH 6.0 5.0 - 8.0   Glucose, UA NEGATIVE NEGATIVE mg/dL   Hgb urine  dipstick LARGE (A) NEGATIVE   Bilirubin Urine NEGATIVE NEGATIVE   Ketones, ur NEGATIVE NEGATIVE mg/dL   Protein, ur NEGATIVE NEGATIVE mg/dL   Nitrite POSITIVE (A) NEGATIVE   Leukocytes, UA TRACE (A) NEGATIVE   RBC / HPF >50 (H) 0 - 5 RBC/hpf   WBC, UA 21-50 0 - 5 WBC/hpf   Bacteria, UA NONE SEEN NONE SEEN   Imaging Studies: No results found.  ED COURSE and MDM  Nursing notes and initial vitals signs, including pulse oximetry, reviewed.  Vitals:   11/29/17 0554 11/29/17 0619  BP: 132/66   Pulse: 70   Resp: 15   Temp: 97.7 F (36.5 C)   TempSrc: Oral   SpO2: 95%   Weight:  77.1 kg  Height:  5\' 10"  (1.778 m)   7:05 AM Nursing staff placed a coud catheter and got 1500 mL of urine.  Urine sent for culture.  We will avoid antibiotics at this time due to multiple antibiotic allergies.  PROCEDURES    ED DIAGNOSES     ICD-10-CM   1. Urinary retention R33.9        Navin Dogan, Jenny Reichmann, MD 11/29/17 [phone]

## 2017-11-29 NOTE — ED Notes (Signed)
Bed: BH41 Expected date:  Expected time:  Means of arrival:  Comments: EMS 84 yo male from SNF-no urine output from catheter-had 100 cc's blood out

## 2017-11-29 NOTE — ED Notes (Addendum)
PTAR present to transport pt to [facility]

## 2017-11-29 NOTE — ED Notes (Signed)
Bladder scanned with bladder volume of 961ml.

## 2017-12-10 ENCOUNTER — Encounter: Payer: Self-pay | Admitting: Family Medicine

## 2017-12-10 ENCOUNTER — Non-Acute Institutional Stay (SKILLED_NURSING_FACILITY): Payer: Medicare Other | Admitting: Family Medicine

## 2017-12-10 DIAGNOSIS — G4700 Insomnia, unspecified: Secondary | ICD-10-CM

## 2017-12-10 DIAGNOSIS — G903 Multi-system degeneration of the autonomic nervous system: Secondary | ICD-10-CM

## 2017-12-10 DIAGNOSIS — G2581 Restless legs syndrome: Secondary | ICD-10-CM

## 2017-12-10 DIAGNOSIS — I350 Nonrheumatic aortic (valve) stenosis: Secondary | ICD-10-CM | POA: Diagnosis not present

## 2017-12-10 DIAGNOSIS — N319 Neuromuscular dysfunction of bladder, unspecified: Secondary | ICD-10-CM

## 2017-12-10 DIAGNOSIS — I951 Orthostatic hypotension: Secondary | ICD-10-CM | POA: Diagnosis not present

## 2017-12-10 DIAGNOSIS — R339 Retention of urine, unspecified: Secondary | ICD-10-CM | POA: Diagnosis not present

## 2017-12-10 NOTE — Progress Notes (Signed)
Provider:  Alain Honey, MD Location:  [facility] Room Number: 65 Place of Service:  SNF ([phone])  PCP: Wardell Honour, MD Patient Care Team: Wardell Honour, MD as PCP - General (Family Medicine) [name], MD as PCP - Cardiology (Cardiology) Croitoru, Dani Gobble, MD as Attending Physician (Cardiology) Vevelyn Royals, MD as Consulting Physician (Ophthalmology) Franchot Gallo, MD as Consulting Physician (Urology) Tanda Rockers, MD as Consulting Physician (Pulmonary Disease) Allyn Kenner, MD (Dermatology) Deliah Goody, PA-C as Physician Assistant (Physician Assistant) Ngetich, Nelda Bucks, NP as Nurse Practitioner (Family Medicine)  Extended Emergency Contact Information Primary Emergency Contact: Rubi,[name] Address: [address], [place] Phone: [phone] Mobile Phone: [phone] Relation: Spouse Secondary Emergency Contact: [name],[place]  [place] Mobile Phone: [phone] Relation: Daughter  Code Status: FULL Goals of Care: Advanced Directive information Advanced Directives 12/10/2017  Does Patient Have a Medical Advance Directive? Yes  Type of Paramedic of [place];Living will  Does patient want to make changes to medical advance directive? No - Patient declined  Copy of [place] in Chart? Yes - validated most recent copy scanned in chart (See row information)  Would patient like information on creating a medical advance directive? -  Pre-existing out of facility DNR order (yellow form or pink MOST form) -      Chief Complaint  Patient presents with  . Medical Management of Chronic Issues    Routine Visit    HPI: Patient is a 84 y.o. male seen today for medical management of chronic problems including: Orthostatic hypotension hypothyroidism urinary tract infection restless leg syndrome.  Patient has been in the skilled  nursing facility for about 2 years according to his history.  He seems to be a fairly accurate historian.  Chief complaints today are chronic penis pain, anxiety and insomnia.  Patient has chronic indwelling Foley cath.  It had recently been changed at the emergency room and that seemed to take care of his pain except now that it has recurred.  It varies from 1 day to the next.  He did have urinary tract infection but that has since been treated.  The catheter seems to fit well there is no leaking around the catheter and no skin irritation. Regarding the anxiety and insomnia he is on trazodone for sleep but feels that that is no longer effective.  He also describes daily anxiety and nervousness, but is not on any medication to help that.  He tells me that he does not nap in the daytime where that might affect his ability to sleep at night.  Past Medical History:  Diagnosis Date  . Arthritis    "minor, back and sometimes knees" (12/15/2012)  . Bradycardia    AFib/SSS s/p St Jude PPM 04/12/2008  . CAD (coronary artery disease) 12/30/2014   CABG (LIMA-LAD, SVG-RCA, SVG-OM in 1996).  07/2009 BMS to SVG-RCA. Cath in 04/2010 with patent stents   . Cardiomyopathy, ischemic 08/25/2012  . CHF (congestive heart failure) ([place])   . Chronic knee pain 12/03/2014  . Combined congestive systolic and diastolic heart failure ([place]) 02/02/2015   Hx EF 41%. BNP 96.8 02/21/15 Torsemide 04/06/15 Na 142, K 4.6, Bun 16, creat 0.89 04/20/14 BNP 111.7, Na 142, K 4.6, Bun 16, creat 0.9   . Depression with anxiety 02/02/2015   02/21/15 Hgb A1c 5.8 03/10/15 MMSE 30/30   . Dizziness,  after diuretic asscoiated with hypotension and responded to fluid bolus 06/05/2011   04/28/15 US carotid R+L normal bilateral arterial velocities.    Marland Kitchen Dyspnea 08/11/2014   Followed in Pulmonary clinic/ [facility]/ [place]  - 08/11/2014  Walked RA x 1 laps @ 185 ft each stopped due to fatigue/off balance/ slow pace/  no sob or desat  - PFT's  09/26/2014   FEV1 2.26 (85 % ) ratio 76  p no % improvement from saba with DLCO  67 % corrects to 93 % for alv volume      Since prev study 08/04/13 minimal change lung vol or dlco    . Embolic cerebral infarction ([place]) 12/06/2015  . Exertional shortness of breath    "sometimes walking" (12/15/2012)  . GERD (gastroesophageal reflux disease)   . Gout 02/09/2015  . Heart murmur    "just told I had one today" (12/15/2012)  . Hiatal hernia   . Hyperlipidemia   . Hypertension   . Hypothyroid   . Influenza A 03/10/2016  . Insomnia   . Melanoma of back ([place]) 1976  . Myocardial infarction [facility]) 1996; 2011   "both silent" (12/15/2012)  . Nonrheumatic aortic valve stenosis   . Orthostatic hypotension   . Osteoporosis, senile   . Pacemaker   . RBBB   . Restless leg 02/02/2015  . Right leg weakness 12/06/2015  . [employer] spotted fever   . S/P CABG x 4   . Sick sinus syndrome ([place]) 01/31/2014  . Sustained ventricular tachycardia ([place]) 07/27/2014  . Urinary retention 12/30/2014   Past Surgical History:  Procedure Laterality Date  . CARDIAC CATHETERIZATION  04/2010   LIMA to LAD patent,SVG to OM patent,no in-stnet restenosis RCA  . CATARACT EXTRACTION W/ INTRAOCULAR LENS  IMPLANT, BILATERAL Bilateral 2012  . CORONARY ANGIOPLASTY WITH STENT PLACEMENT  07/2009   bare metal stent to SVG to the RCA  . CORONARY ARTERY BYPASS GRAFT  1996   LIMA to LAD,SVG to RCA & SVG to OM  . INSERT / REPLACE / REMOVE PACEMAKER  2010  . MELANOMA EXCISION  05/1974 X2   "taken off my back" (12/15/2012)  . NM MYOVIEW LTD  06/2011   low risk  . PPM GENERATOR CHANGEOUT N/A 01/22/2017   Procedure: PPM GENERATOR CHANGEOUT;  Surgeon: [name], MD;  Location: [facility];  Service: Cardiovascular;  Laterality: N/A;  . TONSILLECTOMY  1938  . TRANSURETHRAL RESECTION OF PROSTATE  1986  . US ECHOCARDIOGRAPHY  07/11/2009   EF 45-50%    reports that he has never smoked. He has never used smokeless tobacco. He reports that  he does not drink alcohol or use drugs. Social History   Socioeconomic History  . Marital status: Married    Spouse name: Not on file  . Number of children: 2  . Years of education: Masters  . Highest education level: Not on file  Occupational History  . Occupation: Retired Company secretary -Pensions consultant  Social Needs  . Financial resource strain: Not hard at all  . Food insecurity:    Worry: Never true    Inability: Never true  . Transportation needs:    Medical: No    Non-medical: No  Tobacco Use  . Smoking status: Never Smoker  . Smokeless tobacco: Never Used  Substance and Sexual Activity  . Alcohol use: No    Alcohol/week: 0.0 standard drinks  . Drug use: No  . Sexual activity: Never  Lifestyle  . Physical activity:  Days per week: 7 days    Minutes per session: 10 min  . Stress: Not at all  Relationships  . Social connections:    Talks on phone: More than three times a week    Gets together: More than three times a week    Attends religious service: Never    Active member of club or organization: No    Attends meetings of clubs or organizations: Never    Relationship status: Married  . Intimate partner violence:    Fear of current or ex partner: No    Emotionally abused: No    Physically abused: No    Forced sexual activity: No  Other Topics Concern  . Not on file  Social History Narrative   Lives at [place] to [place] 01/09/15   Married - Violet   Never smoked   Alcohol none   Previously employed as Scientist, forensic for [employer].         Diet:Low sodium   Do you drink/eat things with caffeine? No   Marital status: Married                              What year were you married?1950   Do you live in a house, apartment, assisted living, condo, trailer, etc)?    Is it one or more stories? 1   How many persons live in your home? 2   Do you have any pets in your home? No   Current or past profession: Minister, [employer]iore[employer]     Do you exercise?     Very Little                                                 Type & how often:    Do you have a living will?  Yes   Do you have a DNR Form? Yes   Do you have a POA/HPOA forms? Yes    Functional Status Survey:    Family History  Problem Relation Age of Onset  . Coronary artery disease Mother   . Diabetes Mother   . Heart disease Mother   . Coronary artery disease Father   . Diabetes Father   . Lung cancer Father   . Heart disease Father   . Anuerysm Son   . Heart disease Brother     Health Maintenance  Topic Date Due  . PNA vac Low Risk Adult (2 of 2 - PCV13) 10/06/2010  . INFLUENZA VACCINE  09/04/2017  . TETANUS/TDAP  08/01/2026 (Originally 08/01/2024)  . DEXA SCAN  12/14/2024    Allergies  Allergen Reactions  . Altace [Ramipril] Cough  . Crestor [Rosuvastatin Calcium] Rash  . Penicillins Rash and Other (See Comments)    Has patient had a PCN reaction causing immediate rash, facial/tongue/throat swelling, SOB or lightheadedness with hypotension: Yes Has patient had a PCN reaction causing severe rash involving mucus membranes or skin necrosis: No Has patient had a PCN reaction that required hospitalization No Has patient had a PCN reaction occurring within the last 10 years: No If all of the above answers are "NO", then may proceed with Cephalosporin use.   . Sulfa Antibiotics Rash  . Sulfamethoxazole-Trimethoprim Rash    Outpatient Encounter Medications as of 12/10/2017  Medication Sig  .  acetaminophen (TYLENOL) 500 MG tablet Take 500 mg by mouth every 8 (eight) hours as needed for mild pain.  . Amino Acids-Protein Hydrolys (FEEDING SUPPLEMENT, PRO-STAT SUGAR FREE 64,) LIQD Take 30 mLs by mouth daily.  Marland Kitchen amiodarone (PACERONE) 100 MG tablet Take 1 tablet (100 mg total) by mouth daily.  Marland Kitchen aspirin EC 81 MG tablet Take 1 tablet (81 mg total) by mouth daily.  . Cholecalciferol (VITAMIN D3) 5000 UNITS CAPS Take 5,000 Units by mouth every Monday.   .  clopidogrel (PLAVIX) 75 MG tablet TAKE 1 TABLET ONCE DAILY.  Marland Kitchen Droxidopa 100 MG CAPS Take 100 mg by mouth 3 (three) times daily.  Marland Kitchen guaiFENesin (ROBITUSSIN) 100 MG/5ML SOLN Take 5 mLs (100 mg total) by mouth every 4 (four) hours as needed for cough or to loosen phlegm.  Marland Kitchen HYDROcodone-acetaminophen (NORCO/VICODIN) 5-325 MG tablet Take 1-2 tablets by mouth every 4 (four) hours as needed.   Marland Kitchen HYDROcodone-acetaminophen (NORCO/VICODIN) 5-325 MG tablet Take 1 tablet by mouth 2 (two) times daily.  Marland Kitchen levothyroxine (SYNTHROID, LEVOTHROID) 100 MCG tablet Take 100 mcg by mouth daily before breakfast.  . Lidocaine 4 % PTCH Apply 1 patch topically daily. Left shoulder  . meclizine (ANTIVERT) 25 MG tablet Take 1 tablet (25 mg total) by mouth 2 (two) times daily as needed for dizziness.  . Melatonin 3 MG TABS Take 3 mg by mouth at bedtime.  . methocarbamol (ROBAXIN) 500 MG tablet Take 250 mg by mouth 2 (two) times daily.   . midodrine (PROAMATINE) 5 MG tablet Take 1 tablet (5 mg total) by mouth 3 (three) times daily.  . Multiple Vitamins-Minerals (DECUBI-VITE) CAPS Take 1 capsule by mouth daily.  . NONFORMULARY OR COMPOUNDED ITEM Nizoral 2% cream mixed with Cutivate 0.05% 1:1 ratio: Apply to affected areas of the buttocks 2 times a day and as needed for rash  . pantoprazole (PROTONIX) 40 MG tablet TAKE 1 TABLET BY MOUTH TWICE DAILY.  Marland Kitchen polyethylene glycol (MIRALAX / GLYCOLAX) packet Take 17 g by mouth daily. Hold for loose stool  . pravastatin (PRAVACHOL) 40 MG tablet Take 1 tablet (40 mg total) by mouth at bedtime.  Marland Kitchen rOPINIRole (REQUIP) 0.5 MG tablet TAKE 1 TABLET 3 TIMES A DAY AS NEEDED.  Marland Kitchen sennosides-docusate sodium (SENOKOT-S) 8.6-50 MG tablet Take 2 tablets by mouth at bedtime.   . torsemide (DEMADEX) 5 MG tablet Take 5 mg by mouth daily.  . traZODone (DESYREL) 50 MG tablet Take 1 tablet (50 mg total) by mouth at bedtime.  . [DISCONTINUED] ciprofloxacin (CIPRO) 500 MG tablet Take 1 tablet (500 mg total)  by mouth every 12 (twelve) hours.  . [DISCONTINUED] phenazopyridine (PYRIDIUM) 200 MG tablet Take 1 tablet (200 mg total) by mouth 3 (three) times daily.   No facility-administered encounter medications on file as of 12/10/2017.     Review of Systems  Constitutional: Negative.   HENT: Negative.   Eyes: Negative.   Respiratory: Negative.   Cardiovascular: Negative.   Endocrine: Negative.   Genitourinary: Positive for difficulty urinating and penile pain.  Neurological: Negative.   Hematological: Negative.   Psychiatric/Behavioral: Positive for sleep disturbance. The patient is nervous/anxious.     Vitals:   12/10/17 0856  BP: 136/82  Pulse: 70  Resp: 18  Temp: 97.8 F (36.6 C)  TempSrc: Oral  SpO2: 94%  Weight: 180 lb 14.4 oz (82.1 kg)  Height: 5\' 10"  (1.778 m)   Body mass index is 25.96 kg/m. Physical Exam  Constitutional: He is oriented to  person, place, and time. He appears well-developed and well-nourished.  HENT:  Head: Normocephalic.  Mouth/Throat: Oropharynx is clear and moist.  Cardiovascular: Normal rate and regular rhythm.  Murmur heard. Pulmonary/Chest: Effort normal and breath sounds normal.  Abdominal: Soft. Bowel sounds are normal.  Genitourinary: Penis normal.  Musculoskeletal: Normal range of motion.  Neurological: He is alert and oriented to person, place, and time.  Psychiatric: He has a normal mood and affect. His behavior is normal. Thought content normal.  Nursing note and vitals reviewed.   Labs reviewed: Basic Metabolic Panel: Recent Labs    08/26/17 0426 08/27/17 0423  09/04/17 1054  10/09/17 11/11/17 1350 11/25/17 0024  NA 139 142   < > 140   < > 139 137 138  K 4.2 3.8   < > 3.4*   < > 4.2 4.2 4.1  CL 107 108   < > 105  --   --  103 104  CO2 25 26   < > 26  --   --  25 26  GLUCOSE 104* 109*   < > 94  --   --  105* 112*  BUN 13 18   < > 15   < > 16 14 12   CREATININE 0.49* 0.57*   < > 0.51*   < > 0.64 0.71 0.65  CALCIUM 8.6* 8.5*    < > 8.0*   < > 8.7 8.8* 8.6*  MG 2.2 2.1  --   --   --   --   --   --   PHOS 3.4  --   --   --   --   --   --   --    < > = values in this interval not displayed.   Liver Function Tests: Recent Labs    09/11/17 09/12/17 10/09/17 11/11/17 1800  AST 20 20 13 17   ALT 25 25 8 12   ALKPHOS 76 76 79 75  BILITOT 0.3  --  0.4 0.3  PROT 5.8  --  6.3 6.3*  ALBUMIN 3.0  --  3.6 2.8*   Recent Labs    11/11/17 1800  LIPASE 21   No results for input(s): AMMONIA in the last 8760 hours. CBC: Recent Labs    08/27/17 0423  09/04/17 1054  10/09/17  11/11/17 1350 11/17/17 11/25/17 0024  WBC 8.9   < > 12.2*   < > 7.4   < > 10.3 9.1 11.8*  NEUTROABS 6.5  --  8.9*  --   --   --   --   --  8.8*  HGB 12.1*   < > 12.8*   < > 12.8   < > 12.4* 11.9* 12.5*  HCT 36.9*   < > 39.2   < > 37.9   < > 40.0 35* 39.0  MCV 97.1   < > 97.3   < > 92.7  --  97.8  --  98.2  PLT 243   < > 354   < >  --    < > 300 312 284   < > = values in this interval not displayed.   Cardiac Enzymes: Recent Labs    08/26/17 1439 08/26/17 2136 08/27/17 0423  CKTOTAL 102  --   --   TROPONINI 0.03* 0.03* 0.03*   BNP: Invalid input(s): POCBNP Lab Results  Component Value Date   HGBA1C 5.6 12/07/2015   Lab Results  Component Value Date   TSH 1.125 08/26/2017  No results found for: VITAMINB12 No results found for: FOLATE Lab Results  Component Value Date   FERRITIN 88 10/11/2016    Imaging and Procedures obtained prior to SNF admission: No results found.  Assessment/Plan 1. Orthostatic hypotension Takes ProAmatine for blood pressure maintenance.  Recent history of syncopal episode.  Also takes droxidopa.  2. Nonrheumatic aortic valve stenosis Long history of aortic stenosis.  Basically asymptomatic  3. Neurogenic orthostatic hypotension (HCC) See above.  Continue same medications for blood pressure support  4. Urinary retention Related to neurogenic bladder.  Patient will continue to have indwelling  catheter  5. RLS (restless legs syndrome) Patient takes Requip for restless legs.  No current complaint  6. Insomnia, unspecified type Since he is having both insomnia and anxiety to try him on low-dose alprazolam discontinue trazodone  7. Neurogenic bladder He is followed by urology.  Will need catheter indefinitely   Family/ staff Communication: Findings and suggestions communicated to patient who demonstrates understanding  Labs/tests ordered:  Lillette Boxer. Sabra Heck, [place] [address] [place], [place] Office [phone]

## 2017-12-11 ENCOUNTER — Telehealth: Payer: Self-pay

## 2017-12-11 NOTE — Telephone Encounter (Signed)
Patient's daughter Caryl Asp called the [facility] clinic wanting to speak with Dr. Sabra Heck. She is concerned about her father's pain and symptoms. She stated that her father  Adam [name] had told her that his pain in getting no better and he is having same symptoms he had when he had UTI. She also stated that a nurse told her a urinalysis would be ordered on the patient. She stated  no urine has been collected for testing. She would like someone to please call her and Mr. Seoane urinary issues to be addressed further.   I will route the current provider at [facility].

## 2017-12-12 NOTE — Telephone Encounter (Signed)
Patient and wife states has a urologist appointment this afternoon.Penis pain is chronic and takes Norco.No abdominal pain or hematuria.Nurse reports no fever or chills.

## 2017-12-30 LAB — BASIC METABOLIC PANEL
BUN: 16 (ref 4–21)
Creatinine: 0.8 (ref 0.6–1.3)
Glucose: 141
Potassium: 4.3 (ref 3.4–5.3)
Sodium: 136 — AB (ref 137–147)

## 2017-12-30 LAB — HEPATIC FUNCTION PANEL
ALT: 12 (ref 10–40)
AST: 21 (ref 14–40)
Alkaline Phosphatase: 92 (ref 25–125)
Bilirubin, Total: 0.4

## 2018-01-12 ENCOUNTER — Non-Acute Institutional Stay (SKILLED_NURSING_FACILITY): Payer: Medicare Other | Admitting: Family

## 2018-01-12 ENCOUNTER — Encounter: Payer: Self-pay | Admitting: Family

## 2018-01-12 DIAGNOSIS — I5042 Chronic combined systolic (congestive) and diastolic (congestive) heart failure: Secondary | ICD-10-CM | POA: Diagnosis not present

## 2018-01-12 DIAGNOSIS — E039 Hypothyroidism, unspecified: Secondary | ICD-10-CM | POA: Diagnosis not present

## 2018-01-12 DIAGNOSIS — M545 Low back pain: Secondary | ICD-10-CM

## 2018-01-12 DIAGNOSIS — K2100 Gastro-esophageal reflux disease with esophagitis, without bleeding: Secondary | ICD-10-CM

## 2018-01-12 DIAGNOSIS — G8929 Other chronic pain: Secondary | ICD-10-CM

## 2018-01-12 DIAGNOSIS — E782 Mixed hyperlipidemia: Secondary | ICD-10-CM | POA: Diagnosis not present

## 2018-01-12 DIAGNOSIS — M542 Cervicalgia: Secondary | ICD-10-CM

## 2018-01-12 NOTE — Progress Notes (Signed)
Location:  [place] Room Number: [place] of Service:  SNF [id number]) Provider: Dinah Ngetich FNP-C   Wardell Honour, MD  Patient Care Team: Wardell Honour, MD as PCP - General (Family Medicine) [name], MD as PCP - Cardiology (Cardiology) [name], MD as Attending Physician (Cardiology) Vevelyn Royals, MD as Consulting Physician (Ophthalmology) Franchot Gallo, MD as Consulting Physician (Urology) Tanda Rockers, MD as Consulting Physician (Pulmonary Disease) Allyn Kenner, MD (Dermatology) Deliah Goody, PA-C as Physician Assistant (Physician Assistant) Ngetich, Nelda Bucks, NP as Nurse Practitioner (Family Medicine)  Extended Emergency Contact Information Primary Emergency Contact: Clapper,[name] Address: [address]          [place] Phone: [phone] Mobile Phone: [phone] Relation: Spouse Secondary Emergency Contact: [name],Ballow  [place] Mobile Phone: [phone] Relation: Daughter  Code Status:  Full Code  Goals of care: Advanced Directive information Advanced Directives 01/12/2018  Does Patient Have a Medical Advance Directive? Yes  Type of Paramedic of [place];Living will  Does patient want to make changes to medical advance directive? No - Patient declined  Copy of [place] in Chart? Yes - validated most recent copy scanned in chart (See row information)  Would patient like information on creating a medical advance directive? -  Pre-existing out of facility DNR order (yellow form or pink MOST form) -     Chief Complaint  Patient presents with  . Medical Management of Chronic Issues    routine visit    HPI:  Pt is a 84 y.o. [name] seen today [place] for medical management of chronic diseases.He is seen in his room today with wife and facility Nurse present at bedside.he states continues to have pain  across lower back and neck.He has a lidocaine patch to neck areas which seems to relief pain.He also takes Norco 1-2 tablets every 4 hours as needed for pain. No recent fall episode reported.he continues to work with recreational Therapy.His weight log reviewed weight stable over one months.he states eats well.Facility Nurse reports no new concerns.    Past Medical History:  Diagnosis Date  . Arthritis    "minor, back and sometimes knees" (12/15/2012)  . Bradycardia    AFib/SSS s/p St Jude PPM 04/12/2008  . CAD (coronary artery disease) 12/30/2014   CABG (LIMA-LAD, SVG-RCA, SVG-OM in 1996).  07/2009 BMS to SVG-RCA. Cath in 04/2010 with patent stents   . Cardiomyopathy, ischemic 08/25/2012  . CHF (congestive heart failure) ([place])   . Chronic knee pain 12/03/2014  . Combined congestive systolic and diastolic heart failure ([place]) 02/02/2015   Hx EF 41%. BNP 96.8 02/21/15 Torsemide 04/06/15 Na 142, K 4.6, Bun 16, creat 0.89 04/20/14 BNP 111.7, Na 142, K 4.6, Bun 16, creat 0.9   . Depression with anxiety 02/02/2015   02/21/15 Hgb A1c 5.8 03/10/15 MMSE 30/30   . Dizziness, after diuretic asscoiated with hypotension and responded to fluid bolus 06/05/2011   04/28/15 US carotid R+L normal bilateral arterial velocities.    Marland Kitchen Dyspnea 08/11/2014   Followed in Pulmonary clinic/ [facility]/ [facility]  - 08/11/2014  Walked RA x 1 laps @ 185 ft each stopped due to fatigue/off balance/ slow pace/  no sob or desat  - PFT's  09/26/2014  FEV1 2.26 (85 % ) ratio 76  p no % improvement from saba with DLCO  67 % corrects to 93 % for alv volume  Since prev study 08/04/13 minimal change lung vol or dlco    . Embolic cerebral infarction ([place]) 12/06/2015  . Exertional shortness of breath    "sometimes walking" (12/15/2012)  . GERD (gastroesophageal reflux disease)   . Gout 02/09/2015  . Heart murmur    "just told I had one today" (12/15/2012)  . Hiatal hernia   . Hyperlipidemia   . Hypertension   . Hypothyroid   . Influenza A  03/10/2016  . Insomnia   . Melanoma of back ([place]) 1976  . Myocardial infarction [facility]) 1996; 2011   "both silent" (12/15/2012)  . Nonrheumatic aortic valve stenosis   . Orthostatic hypotension   . Osteoporosis, senile   . Pacemaker   . RBBB   . Restless leg 02/02/2015  . Right leg weakness 12/06/2015  . [facility] spotted fever   . S/P CABG x 4   . Sick sinus syndrome ([place]) 01/31/2014  . Sustained ventricular tachycardia ([place]) 07/27/2014  . Urinary retention 12/30/2014   Past Surgical History:  Procedure Laterality Date  . CARDIAC CATHETERIZATION  04/2010   LIMA to LAD patent,SVG to OM patent,no in-stnet restenosis RCA  . CATARACT EXTRACTION W/ INTRAOCULAR LENS  IMPLANT, BILATERAL Bilateral 2012  . CORONARY ANGIOPLASTY WITH STENT PLACEMENT  07/2009   bare metal stent to SVG to the RCA  . CORONARY ARTERY BYPASS GRAFT  1996   LIMA to LAD,SVG to RCA & SVG to OM  . INSERT / REPLACE / REMOVE PACEMAKER  2010  . MELANOMA EXCISION  05/1974 X2   "taken off my back" (12/15/2012)  . NM MYOVIEW LTD  06/2011   low risk  . PPM GENERATOR CHANGEOUT N/A 01/22/2017   Procedure: PPM GENERATOR CHANGEOUT;  Surgeon: [name], MD;  Location: [place] [facility];  Service: Cardiovascular;  Laterality: N/A;  . TONSILLECTOMY  1938  . TRANSURETHRAL RESECTION OF PROSTATE  1986  . US ECHOCARDIOGRAPHY  07/11/2009   EF 45-50%    Allergies  Allergen Reactions  . Altace [Ramipril] Cough  . Crestor [Rosuvastatin Calcium] Rash  . Penicillins Rash and Other (See Comments)    Has patient had a PCN reaction causing immediate rash, facial/tongue/throat swelling, SOB or lightheadedness with hypotension: Yes Has patient had a PCN reaction causing severe rash involving mucus membranes or skin necrosis: No Has patient had a PCN reaction that required hospitalization No Has patient had a PCN reaction occurring within the last 10 years: No If all of the above answers are "NO", then may proceed with  Cephalosporin use.   . Sulfa Antibiotics Rash  . Sulfamethoxazole-Trimethoprim Rash    Allergies as of 01/12/2018      Reactions   Altace [ramipril] Cough   Crestor [rosuvastatin Calcium] Rash   Penicillins Rash, Other (See Comments)   Has patient had a PCN reaction causing immediate rash, facial/tongue/throat swelling, SOB or lightheadedness with hypotension: Yes Has patient had a PCN reaction causing severe rash involving mucus membranes or skin necrosis: No Has patient had a PCN reaction that required hospitalization No Has patient had a PCN reaction occurring within the last 10 years: No If all of the above answers are "NO", then may proceed with Cephalosporin use.   Sulfa Antibiotics Rash   Sulfamethoxazole-trimethoprim Rash      Medication List        Accurate as of 01/12/18  4:46 PM. Always use your most recent med list.          acetaminophen 500 MG tablet Commonly  known as:  TYLENOL Take 500 mg by mouth every 8 (eight) hours as needed for mild pain (Should not exceed 3000mg ).   amiodarone 100 MG tablet Commonly known as:  PACERONE Take 1 tablet (100 mg total) by mouth daily.   aspirin EC 81 MG tablet Take 1 tablet (81 mg total) by mouth daily.   clopidogrel 75 MG tablet Commonly known as:  PLAVIX TAKE 1 TABLET ONCE DAILY.   DECUBI-VITE Caps Take 1 capsule by mouth daily.   Droxidopa 100 MG Caps Take 100 mg by mouth 3 (three) times daily.   feeding supplement (PRO-STAT SUGAR FREE 64) Liqd Take 30 mLs by mouth daily.   guaiFENesin 100 MG/5ML Soln Commonly known as:  ROBITUSSIN Take 5 mLs (100 mg total) by mouth every 4 (four) hours as needed for cough or to loosen phlegm.   HYDROcodone-acetaminophen 5-325 MG tablet Commonly known as:  NORCO/VICODIN Take 1-2 tablets by mouth every 4 (four) hours as needed.   HYDROcodone-acetaminophen 5-325 MG tablet Commonly known as:  NORCO/VICODIN Take 1 tablet by mouth 2 (two) times daily.   levothyroxine 100 MCG  tablet Commonly known as:  SYNTHROID, LEVOTHROID Take 100 mcg by mouth daily before breakfast.   Lidocaine 4 % Ptch Apply 1 patch topically daily. Left shoulder   meclizine 25 MG tablet Commonly known as:  ANTIVERT Take 1 tablet (25 mg total) by mouth 2 (two) times daily as needed for dizziness.   Melatonin 3 MG Tabs Take 3 mg by mouth at bedtime.   methocarbamol 500 MG tablet Commonly known as:  ROBAXIN Take 250 mg by mouth 2 (two) times daily.   midodrine 5 MG tablet Commonly known as:  PROAMATINE Take 1 tablet (5 mg total) by mouth 3 (three) times daily.   NONFORMULARY OR COMPOUNDED ITEM Nizoral 2% cream mixed with Cutivate 0.05% 1:1 ratio: Apply to affected areas of the buttocks 2 times a day and as needed for rash   pantoprazole 40 MG tablet Commonly known as:  PROTONIX TAKE 1 TABLET BY MOUTH TWICE DAILY.   polyethylene glycol packet Commonly known as:  MIRALAX / GLYCOLAX Take 17 g by mouth daily. Hold for loose stool   pravastatin 40 MG tablet Commonly known as:  PRAVACHOL Take 1 tablet (40 mg total) by mouth at bedtime.   rOPINIRole 0.5 MG tablet Commonly known as:  REQUIP TAKE 1 TABLET 3 TIMES A DAY AS NEEDED.   sennosides-docusate sodium 8.6-50 MG tablet Commonly known as:  SENOKOT-S Take 2 tablets by mouth at bedtime.   torsemide 5 MG tablet Commonly known as:  DEMADEX Take 5 mg by mouth daily.   Vitamin D3 125 MCG (5000 UT) Caps Take 5,000 Units by mouth every Monday.       Review of Systems  Constitutional: Negative for activity change, appetite change, chills, fatigue, fever and unexpected weight change.  HENT: Negative for congestion, ear pain, rhinorrhea, sinus pressure, sinus pain, sneezing and sore throat.   Eyes: Negative for discharge, redness, itching and visual disturbance.  Respiratory: Negative for cough, chest tightness, shortness of breath and wheezing.   Cardiovascular: Negative for chest pain, palpitations and leg swelling.    Gastrointestinal: Negative for abdominal distention, abdominal pain, constipation, diarrhea, nausea and vomiting.  Endocrine: Negative for cold intolerance, heat intolerance, polydipsia, polyphagia and polyuria.  Genitourinary: Negative for dysuria, flank pain, frequency, hematuria and urgency.       Indwelling foley catheter follows up with Urology   Musculoskeletal: Positive for gait problem.  Chronic neck and back pain   Skin: Negative for color change, pallor, rash and wound.  Neurological: Negative for dizziness, weakness, light-headedness and headaches.  Hematological: Does not bruise/bleed easily.  Psychiatric/Behavioral: Negative for agitation, confusion and sleep disturbance. The patient is not nervous/anxious.     Immunization History  Administered Date(s) Administered  . Influenza, High Dose Seasonal PF 11/01/2014, 11/13/2016  . Influenza,inj,Quad PF,6+ Mos 11/24/2017  . Influenza-Unspecified 12/05/2013, 11/05/2014, 11/16/2015  . PPD Test 03/07/2014  . Pneumococcal Conjugate-13 10/05/2009  . Pneumococcal Polysaccharide-23 11/05/2005  . Pneumococcal-Unspecified 10/05/2009  . Tdap 08/02/2014  . Zoster 11/05/2005   Pertinent  Health Maintenance Due  Topic Date Due  . PNA vac Low Risk Adult (2 of 2 - PCV13) 10/06/2010  . DEXA SCAN  12/14/2024  . INFLUENZA VACCINE  Completed   Fall Risk  10/09/2017 10/02/2016 10/30/2015 09/05/2015 02/02/2015  Falls in the past year? No No No No No  Risk for fall due to : - - Impaired balance/gait - -    Vitals:   01/12/18 1110  BP: (!) 147/70  Pulse: 71  Resp: 20  Temp: (!) 97.1 F (36.2 C)  SpO2: 96%  Weight: 176 lb 9.6 oz (80.1 kg)  Height: 5\' 10"  (1.778 m)   Body mass index is 25.34 kg/m. Physical Exam Vitals signs and nursing note reviewed.  Constitutional:      General: He is not in acute distress.    Appearance: Normal appearance. He is overweight. He is not ill-appearing.  HENT:     Head: Normocephalic.     Right  Ear: Tympanic membrane, ear canal and external ear normal. There is no impacted cerumen.     Left Ear: Tympanic membrane, ear canal and external ear normal. There is no impacted cerumen.     Nose: Nose normal. No congestion or rhinorrhea.     Mouth/Throat:     Mouth: Mucous membranes are moist.     Pharynx: Oropharynx is clear. No oropharyngeal exudate or posterior oropharyngeal erythema.  Eyes:     General: No scleral icterus.       Right eye: No discharge.        Left eye: No discharge.     Extraocular Movements: Extraocular movements intact.     Conjunctiva/sclera: Conjunctivae normal.     Pupils: Pupils are equal, round, and reactive to light.  Neck:     Musculoskeletal: Neck supple. No muscular tenderness.  Cardiovascular:     Pulses: Normal pulses.     Heart sounds: Murmur present. No friction rub. No gallop.   Pulmonary:     Effort: Pulmonary effort is normal. No respiratory distress.     Breath sounds: Normal breath sounds. No stridor. No wheezing, rhonchi or rales.  Chest:     Chest wall: No tenderness.  Abdominal:     General: Bowel sounds are normal. There is no distension.     Palpations: Abdomen is soft. There is no mass.     Tenderness: There is no abdominal tenderness. There is no right CVA tenderness, left CVA tenderness, guarding or rebound.  Genitourinary:    Comments: Indwelling foley catheter draining adequate clear yellow urine.  Musculoskeletal:        General: No swelling or tenderness.     Right lower leg: No edema.     Left lower leg: No edema.     Comments: Unsteady gait uses wheelchair with assistance   Lymphadenopathy:     Cervical: No cervical adenopathy.  Skin:  General: Skin is warm and dry.     Coloration: Skin is not jaundiced or pale.     Findings: No bruising, erythema, lesion or rash.  Neurological:     Mental Status: He is alert and oriented to person, place, and time.     Motor: No weakness.     Coordination: Coordination normal.      Gait: Gait abnormal.  Psychiatric:        Mood and Affect: Mood normal.        Behavior: Behavior normal.        Thought Content: Thought content normal.        Judgment: Judgment normal.    Labs reviewed: Recent Labs    08/26/17 0426 08/27/17 0423  09/04/17 1054  10/09/17 11/11/17 1350 11/25/17 0024 12/30/17  NA 139 142   < > 140   < > 139 137 138 136*  K 4.2 3.8   < > 3.4*   < > 4.2 4.2 4.1 4.3  CL 107 108   < > 105  --   --  103 104  --   CO2 25 26   < > 26  --   --  25 26  --   GLUCOSE 104* 109*   < > 94  --   --  105* 112*  --   BUN 13 18   < > 15   < > 16 14 12 16   CREATININE 0.49* 0.57*   < > 0.Adam*   < > 0.64 0.71 0.65 0.8  CALCIUM 8.6* 8.5*   < > 8.0*   < > 8.7 8.8* 8.6*  --   MG 2.2 2.1  --   --   --   --   --   --   --   PHOS 3.4  --   --   --   --   --   --   --   --    < > = values in this interval not displayed.   Recent Labs    09/11/17  10/09/17 11/11/17 1800 12/30/17  AST 20   < > 13 17 21   ALT 25   < > 8 12 12   ALKPHOS 76   < > 79 75 92  BILITOT 0.3  --  0.4 0.3  --   PROT 5.8  --  6.3 6.3*  --   ALBUMIN 3.0  --  3.6 2.8*  --    < > = values in this interval not displayed.   Recent Labs    08/27/17 0423  09/04/17 1054  10/09/17  11/11/17 1350 11/17/17 11/25/17 0024  WBC 8.9   < > 12.2*   < > 7.4   < > 10.3 9.1 11.8*  NEUTROABS 6.5  --  8.9*  --   --   --   --   --  8.8*  HGB 12.1*   < > 12.8*   < > 12.8   < > 12.4* 11.9* 12.5*  HCT 36.9*   < > 39.2   < > 37.9   < > 40.0 35* 39.0  MCV 97.1   < > 97.3   < > 92.7  --  97.8  --  98.2  PLT 243   < > 354   < >  --    < > 300 312 284   < > = values in this interval not displayed.   Lab Results  Component Value Date  TSH 1.125 08/26/2017   Lab Results  Component Value Date   HGBA1C 5.6 12/07/2015   Lab Results  Component Value Date   CHOL 103 05/05/2017   HDL 39 (L) 05/05/2017   LDLCALC 43 05/05/2017   TRIG 129 05/05/2017   CHOLHDL 2.6 05/05/2017    Significant Diagnostic Results in last  30 days:  No results found.  Assessment/Plan 1. Chronic combined systolic and diastolic congestive heart failure (HCC) No abrupt weight gain,edema or cough.bilateral lung CTA.continue current medication.continue to monitor for weight gain.   2. Hypothyroidism, unspecified type Lab Results  Component Value Date   TSH 1.125 08/26/2017  Continue on levothyroxine 100 mcg tablet daily.monitor TSH level.   3. Gastroesophageal reflux disease with esophagitis Stable.continue on Protonix.   4. Mixed hyperlipidemia Recent LDL at goal.continue on Pravachol recheck lipid panel with next lab work.    5. Chronic bilateral low back pain without sciatic No incontinence or lower extremities weakness.Continue on Norco 1-2 tablets every 4 hours as needed and Robaxin 250 mg tablet twice daily.Add lidocaine 4% topical patch daily.continue to monitor.    6. Chronic neck pain Continue on Lidocaine 4 % topical patch and Norco.  Family/ staff Communication: Reviewed plan of care with patient and facility Nurse supervisor   Labs/tests ordered: None   Nelda Bucks Ngetich, NP

## 2018-01-14 ENCOUNTER — Telehealth: Payer: Self-pay | Admitting: Cardiovascular Disease

## 2018-01-14 NOTE — Telephone Encounter (Addendum)
Returned call to wife who states patient woke up this morning experiencing SOB.   She states he is at [facility] and is not able to move around a lot, he is in a chair most of the time.   She states he always has swelling, but she believes this as increased even with his compression stockings on.   She states he denies CP, dizziness, etc.    Unsure of BP/HR/weight as she states she is unsure if the facility does this "well".  She states he is also experiencing loss of appetite.    Per chart review: note from [facility] NP 12/9-weight 176 lbs, no mention of issues (but note seems incomplete)  BP 147/70 HR 71, mention of weight log being stable over 1 month and patient eating well.  Hx of CAD, CABG, ICM, CHF, VT, PPM, orthostatic hypotension  offered appt today but unable to get patient here by time of appt (as facility brings him).   offered appt tomorrow with Almyra Deforest PA or Dr. Karle Starch for 8AM 12/12 with Dr. Sallyanne Kuster.  Wife is going to call facility to verify they can get him here by this time and call back if unable.

## 2018-01-14 NOTE — Telephone Encounter (Signed)
Pt c/o Shortness Of Breath: STAT if SOB developed within the last 24 hours or pt is noticeably SOB on the phone  1. Are you currently SOB (can you hear that pt is SOB on the phone)? Yes-but wife is calling for him 2. How long have you been experiencing SOB? Started this morning  3. Are you SOB when sitting or when up moving around? sitting  4. Are you currently experiencing any other symptoms? Loss  ofappetite

## 2018-01-15 ENCOUNTER — Telehealth: Payer: Self-pay | Admitting: Adult Health

## 2018-01-15 ENCOUNTER — Ambulatory Visit: Payer: Medicare Other | Admitting: Adult Health

## 2018-01-15 ENCOUNTER — Ambulatory Visit: Payer: Medicare Other | Admitting: Cardiovascular Disease

## 2018-01-15 ENCOUNTER — Ambulatory Visit
Admission: RE | Admit: 2018-01-15 | Discharge: 2018-01-15 | Disposition: A | Discharge status: Home / Self Care | Payer: Medicare Other | Source: Ambulatory Visit | Attending: Adult Health | Admitting: Adult Health

## 2018-01-15 ENCOUNTER — Encounter: Payer: Self-pay | Admitting: Adult Health

## 2018-01-15 VITALS — BP 80/48 | HR 73 | Ht 70.0 in

## 2018-01-15 DIAGNOSIS — I2510 Atherosclerotic heart disease of native coronary artery without angina pectoris: Secondary | ICD-10-CM

## 2018-01-15 DIAGNOSIS — R0602 Shortness of breath: Secondary | ICD-10-CM

## 2018-01-15 DIAGNOSIS — Z79899 Other long term (current) drug therapy: Secondary | ICD-10-CM | POA: Diagnosis not present

## 2018-01-15 DIAGNOSIS — I959 Hypotension, unspecified: Secondary | ICD-10-CM

## 2018-01-15 DIAGNOSIS — I5032 Chronic diastolic (congestive) heart failure: Secondary | ICD-10-CM | POA: Diagnosis not present

## 2018-01-15 MED ORDER — TORSEMIDE 10 MG PO TABS: 10.0000 mg | ORAL_TABLET | Freq: Every day | ORAL | Status: DC

## 2018-01-15 MED ORDER — MIDODRINE HCL 10 MG PO TABS: 10.0000 mg | ORAL_TABLET | Freq: Three times a day (TID) | ORAL | Status: DC

## 2018-01-15 NOTE — Progress Notes (Signed)
Agreed. Thanks [employer]

## 2018-01-15 NOTE — Progress Notes (Signed)
Cardiology Office Note   Date:  01/15/2018   ID:  Adam [name], DOB 1926-05-27, MRN [id number]  PCP:  Wardell Honour, MD  Cardiologist:  Dr. Sallyanne Kuster No chief complaint on file.    History of Present Illness: Adam [name] is a 84 y.o. male who presents for complaints of edema and worsening breathing. He has a history of "silent myocardial infarction X 2" with known hx of CAD with subsequent 1996, BMS to the SVG-RCA, prior sustained VY, chronic combined systolic and diastolic CHF, hx of ischemic CVA, PPM in situ (St. Jude), severe disabling orthostatic hypotension.    He is a resident of [place] and has become very sedentary due to his hypotension. His wife called [place] due to worsening edema more so than usual chronic edema. He denies chest pain or dizziness. He has also been experiencing loss of appetite. No weights were recorded, BP 147/70 with HR of  71.   His wife and daughter come with him, and are VERY attentive, aware of every change in his status, and any new symptoms. They are concerned about the worsening dyspnea. On interview with the patient, he reports worsening breathing status especially when he is trying to get out of bed in the morning, or with minimal exertion. He has come chest pressure. He as felt bloated in is abdomen and has early satiety. He states that his edema is worsened.  He is not very active due to hypotension. Is on multiple doses of pain medications and a Lidocaine patch along his spine.     Past Medical History:  Diagnosis Date  . Arthritis    "minor, back and sometimes knees" (12/15/2012)  . Bradycardia    AFib/SSS s/p St Jude PPM 04/12/2008  . CAD (coronary artery disease) 12/30/2014   CABG (LIMA-LAD, SVG-RCA, SVG-OM in 1996).  07/2009 BMS to SVG-RCA. Cath in 04/2010 with patent stents   . Cardiomyopathy, ischemic 08/25/2012  . CHF (congestive heart failure) ([place])   . Chronic knee pain 12/03/2014  . Combined congestive systolic and diastolic  heart failure ([place]) 02/02/2015   Hx EF 41%. BNP 96.8 02/21/15 Torsemide 04/06/15 Na 142, K 4.6, Bun 16, creat 0.89 04/20/14 BNP 111.7, Na 142, K 4.6, Bun 16, creat 0.9   . Depression with anxiety 02/02/2015   02/21/15 Hgb A1c 5.8 03/10/15 MMSE 30/30   . Dizziness, after diuretic asscoiated with hypotension and responded to fluid bolus 06/05/2011   04/28/15 US carotid R+L normal bilateral arterial velocities.    Marland Kitchen Dyspnea 08/11/2014   Followed in Pulmonary clinic/ [facility]/ [place]  - 08/11/2014  Walked RA x 1 laps @ 185 ft each stopped due to fatigue/off balance/ slow pace/  no sob or desat  - PFT's  09/26/2014  FEV1 2.26 (85 % ) ratio 76  p no % improvement from saba with DLCO  67 % corrects to 93 % for alv volume      Since prev study 08/04/13 minimal change lung vol or dlco    . Embolic cerebral infarction ([place]) 12/06/2015  . Exertional shortness of breath    "sometimes walking" (12/15/2012)  . GERD (gastroesophageal reflux disease)   . Gout 02/09/2015  . Heart murmur    "just told I had one today" (12/15/2012)  . Hiatal hernia   . Hyperlipidemia   . Hypertension   . Hypothyroid   . Influenza A 03/10/2016  . Insomnia   . Melanoma of back ([place]) 1976  . Myocardial infarction ([place])  1996; 2011   "both silent" (12/15/2012)  . Nonrheumatic aortic valve stenosis   . Orthostatic hypotension   . Osteoporosis, senile   . Pacemaker   . RBBB   . Restless leg 02/02/2015  . Right leg weakness 12/06/2015  . [facility] spotted fever   . S/P CABG x 4   . Sick sinus syndrome ([place]) 01/31/2014  . Sustained ventricular tachycardia ([place]) 07/27/2014  . Urinary retention 12/30/2014    Past Surgical History:  Procedure Laterality Date  . CARDIAC CATHETERIZATION  04/2010   LIMA to LAD patent,SVG to OM patent,no in-stnet restenosis RCA  . CATARACT EXTRACTION W/ INTRAOCULAR LENS  IMPLANT, BILATERAL Bilateral 2012  . CORONARY ANGIOPLASTY WITH STENT PLACEMENT  07/2009   bare metal stent to SVG to the RCA  .  CORONARY ARTERY BYPASS GRAFT  1996   LIMA to LAD,SVG to RCA & SVG to OM  . INSERT / REPLACE / REMOVE PACEMAKER  2010  . MELANOMA EXCISION  05/1974 X2   "taken off my back" (12/15/2012)  . NM MYOVIEW LTD  06/2011   low risk  . PPM GENERATOR CHANGEOUT N/A 01/22/2017   Procedure: PPM GENERATOR CHANGEOUT;  Surgeon: [name], MD;  Location: [facility];  Service: Cardiovascular;  Laterality: N/A;  . TONSILLECTOMY  1938  . TRANSURETHRAL RESECTION OF PROSTATE  1986  . US ECHOCARDIOGRAPHY  07/11/2009   EF 45-50%     Current Outpatient Medications  Medication Sig Dispense Refill  . acetaminophen (TYLENOL) 500 MG tablet Take 500 mg by mouth every 8 (eight) hours as needed for mild pain (Should not exceed 3000mg ).     . Amino Acids-Protein Hydrolys (FEEDING SUPPLEMENT, PRO-STAT SUGAR FREE 64,) LIQD Take 30 mLs by mouth daily.    Marland Kitchen amiodarone (PACERONE) 100 MG tablet Take 1 tablet (100 mg total) by mouth daily. 90 tablet 3  . aspirin EC 81 MG tablet Take 1 tablet (81 mg total) by mouth daily. 30 tablet 1  . Cholecalciferol (VITAMIN D3) 5000 UNITS CAPS Take 5,000 Units by mouth every Monday.     . clopidogrel (PLAVIX) 75 MG tablet TAKE 1 TABLET ONCE DAILY. 90 tablet 1  . Droxidopa 100 MG CAPS Take 100 mg by mouth 3 (three) times daily. 90 capsule 6  . guaiFENesin (ROBITUSSIN) 100 MG/5ML SOLN Take 5 mLs (100 mg total) by mouth every 4 (four) hours as needed for cough or to loosen phlegm. 1200 mL 0  . HYDROcodone-acetaminophen (NORCO/VICODIN) 5-325 MG tablet Take 1-2 tablets by mouth every 4 (four) hours as needed.     Marland Kitchen HYDROcodone-acetaminophen (NORCO/VICODIN) 5-325 MG tablet Take 1 tablet by mouth 2 (two) times daily.    Marland Kitchen levothyroxine (SYNTHROID, LEVOTHROID) 100 MCG tablet Take 100 mcg by mouth daily before breakfast.    . Lidocaine 4 % PTCH Apply 1 patch topically daily. Left shoulder    . meclizine (ANTIVERT) 25 MG tablet Take 1 tablet (25 mg total) by mouth 2 (two) times daily as  needed for dizziness. 30 tablet 0  . Melatonin 3 MG TABS Take 3 mg by mouth at bedtime.    . methocarbamol (ROBAXIN) 500 MG tablet Take 250 mg by mouth 2 (two) times daily.     . midodrine (PROAMATINE) 5 MG tablet Take 1 tablet (5 mg total) by mouth 3 (three) times daily. 30 tablet 6  . Multiple Vitamins-Minerals (DECUBI-VITE) CAPS Take 1 capsule by mouth daily.    . NONFORMULARY OR COMPOUNDED ITEM Nizoral 2% cream  mixed with Cutivate 0.05% 1:1 ratio: Apply to affected areas of the buttocks 2 times a day and as needed for rash    . pantoprazole (PROTONIX) 40 MG tablet TAKE 1 TABLET BY MOUTH TWICE DAILY. 60 tablet 11  . polyethylene glycol (MIRALAX / GLYCOLAX) packet Take 17 g by mouth daily. Hold for loose stool    . pravastatin (PRAVACHOL) 40 MG tablet Take 1 tablet (40 mg total) by mouth at bedtime. 90 tablet 3  . rOPINIRole (REQUIP) 0.5 MG tablet TAKE 1 TABLET 3 TIMES A DAY AS NEEDED. 90 tablet 1  . sennosides-docusate sodium (SENOKOT-S) 8.6-50 MG tablet Take 2 tablets by mouth at bedtime.     . torsemide (DEMADEX) 5 MG tablet Take 5 mg by mouth daily.     No current facility-administered medications for this visit.     Allergies:   Altace [ramipril]; Crestor [rosuvastatin calcium]; Penicillins; Sulfa antibiotics; and Sulfamethoxazole-trimethoprim    Social History:  The patient  reports that he has never smoked. He has never used smokeless tobacco. He reports that he does not drink alcohol or use drugs.   Family History:  The patient's family history includes Anuerysm in his son; Coronary artery disease in his father and mother; Diabetes in his father and mother; Heart disease in his brother, father, and mother; Lung cancer in his father.    ROS: All other systems are reviewed and negative. Unless otherwise mentioned in H&P    PHYSICAL EXAM: VS:  There were no vitals taken for this visit. , BMI There is no height or weight on file to calculate BMI. GEN: Well nourished, well  developed, in no acute distress HEENT: normal Neck: no JVD, carotid bruits, or masses Cardiac:RRR; 2/6 systolic urmurs, rubs, or gallops,1+ edema while wearing TED hose.   Respiratory:  Clear to auscultation bilaterally, normal work of breathing GI: soft, nontender, nondistended, + BS MS: no deformity or atrophy Skin: warm and dry, no rash Neuro:  Strength and sensation are intact Psych: euthymic mood, full affect   EKG: Atrial paced rhythm rate of 73 bpm  Recent Labs: 08/26/2017: TSH 1.125 08/27/2017: Magnesium 2.1 11/11/2017: B Natriuretic Peptide 176.8 11/25/2017: Hemoglobin 12.5; Platelets 284 12/30/2017: ALT 12; BUN 16; Creatinine 0.8; Potassium 4.3; Sodium 136    Lipid Panel    Component Value Date/Time   CHOL 103 05/05/2017 0000   TRIG 129 05/05/2017 0000   HDL 39 (L) 05/05/2017 0000   CHOLHDL 2.6 05/05/2017 0000   VLDL 20 12/07/2015 0449   LDLCALC 43 05/05/2017 0000      Wt Readings from Last 3 Encounters:  01/12/18 176 lb 9.6 oz (80.1 kg)  12/10/17 180 lb 14.4 oz (82.1 kg)  11/29/17 170 lb (77.1 kg)      Other studies Reviewed: Echocardiogram 2017-09-09 Left ventricle: The cavity size was normal. Wall thickness was   increased in a pattern of severe LVH. Systolic function was   moderately reduced. The estimated ejection fraction was in the   range of 35% to 40%. Akinesis of the basalinferior myocardium.   Doppler parameters are consistent with a reversible restrictive   pattern, indicative of decreased left ventricular diastolic   compliance and/or increased left atrial pressure (grade 3   diastolic dysfunction). - Aortic valve: There was moderate stenosis. There was mild   regurgitation. Valve area (VTI): 1.17 cm^2. Valve area (Vmax):   1.22 cm^2. Valve area (Vmean): 1.34 cm^2.  ASSESSMENT AND PLAN:  1. Acute on Chronic Diastolic CHF: He is  not being weighed regularly at [facility] and therefore is unaware of his weight gain or loss. He states he is not  eating very much due to early satiety and fullness in his abdomen. Weight may not be a true estimation of his status.  I will check CXR to make sure he does not have fluid there.   I will increase the torsemide to 10 mg daily from 5 mg daily. I have written detailed instructions to the SNF to weight him daily and to measure his urine output. He has a catheter and therefore this should not be difficult for the staff.   If his symptoms persist or worsen he is advised to be taken to ER for possible admission.   2. Hypotension: Will increase Midodrine to 10 mg TID from 5 mg TID to assist in the setting of more diureses.   3.  V-Tach: He will remain on amiodarone 100 mg daily as directed by Dr. Sallyanne Kuster.  4. PPM in situ: Checked remotely no evidence of atrial fib or arrhythmias on last download,    Current medicines are reviewed at length with the patient today.  I have spoken to Dr. Sallyanne Kuster about my plan and adhered to his recommendations as above. He will be seen on close follow up next Monday 01/19/2018. I have spent over 50 minutes with this patient, his family, and speaking with Dr. Sallyanne Kuster.   Labs/ tests ordered today include: CXR, BMET.   Phill Myron. West Pugh, ANP, [facility]   01/15/2018 6:54 AM    [address] Office [phone] Fax [phone]

## 2018-01-15 NOTE — Telephone Encounter (Signed)
Spoke with Estill Bamberg from [facility]. She is inquiring if patient hd CXR today - explained that yes, this shows "exam ended" in Epic but no results yet. She also asked about labs - BNP & BMET - and explained that yes, these were done today around 1130.   She states NP note recommends strict I&O but no parameters for this.   She states that there was notation about weight gain on paperwork but they are requesting a baseline weight which was not logged during today's visit.   Will route to NP & Sharyn Lull LPN to review and advise

## 2018-01-15 NOTE — Telephone Encounter (Signed)
Follow Up:    Estill Bamberg from from [facility] wants to know if pt had a xray today?

## 2018-01-15 NOTE — Patient Instructions (Addendum)
Medication Instructions:  INCREASE MIDODRINE 10MG  THREE TIMES DAILY  TORSEMIDE 10MG  DAILY  If you need a refill on your cardiac medications before your next appointment, please call your pharmacy.  Labwork: BMET AND BNP HERE IN OUR OFFICE AT [facility] Take the provided lab slips with you to the lab for your blood draw.   If you have labs (blood work) drawn today and your tests are completely normal, you will receive your results only by [place] (if you have MyChart) -OR- A paper copy in the mail.  If you have any lab test that is abnormal or we need to change your treatment, we will call you to review these results.  Testing/Procedures: CHEST XRAY AT [facility] TO R/O CHF  Special Instructions: FRIENDS HOME WE[facility] AND DO DAILY I&O AND WEIGHTS, BRING THESE LOGS BACK TO APPOINTMENT Monday @ 1130AM  IF PT DOES NOT FEEL BETTER OR I&O IS NOT >300ML OR DYSPNEA WORSENS SEND PT DIRECTLY TO THE HOSPITAL  Follow-Up: You will need a follow up appointment in 4 days.  Please call our office 2 months in advance to schedule this appointment.  You may see [name], MD   Jory Sims, DNP, AACC (APPOINTMENT SCHEDULED Monday AT 1130AM) or one of the following Advanced Practice Providers on your designated Care Team:  Almyra Deforest, [place] OR Fabian Sharp, PA-C   At [facility], you and your health needs are our priority.  As part of our continuing mission to provide you with exceptional heart care, we have created designated Provider Care Teams.  These Care Teams include your primary Cardiologist (physician) and Advanced Practice Providers (APPs -  Physician Assistants and Nurse Practitioners) who all work together to provide you with the care you need, when you need it.

## 2018-01-16 ENCOUNTER — Telehealth: Payer: Self-pay

## 2018-01-16 DIAGNOSIS — K311 Adult hypertrophic pyloric stenosis: Secondary | ICD-10-CM

## 2018-01-16 LAB — BASIC METABOLIC PANEL
BUN/Creatinine Ratio: 20 (ref 10–24)
BUN: 15 mg/dL (ref 8–27)
CO2: 24 mmol/L (ref 20–29)
Calcium: 8.9 mg/dL (ref 8.6–10.2)
Chloride: 101 mmol/L (ref 96–106)
Creatinine, Ser: 0.75 mg/dL — ABNORMAL LOW (ref 0.76–1.27)
GFR calc Af Amer: 94 mL/min/{1.73_m2} (ref >59)
GFR calc non Af Amer: 81 mL/min/{1.73_m2} (ref >59)
Glucose: 100 mg/dL — ABNORMAL HIGH (ref 65–99)
Potassium: 4.1 mmol/L (ref 3.5–5.2)
Sodium: 139 mmol/L (ref 134–144)

## 2018-01-16 LAB — BRAIN NATRIURETIC PEPTIDE: BNP: 161.8 pg/mL — ABNORMAL HIGH (ref 0.0–100.0)

## 2018-01-16 NOTE — Telephone Encounter (Signed)
MD reviewed results of recent CXR with wife ordered by Jory Sims, DNP.  Recommended URGENT GI referral. Referral ordered. Routed to admin for scheduling.

## 2018-01-16 NOTE — Telephone Encounter (Signed)
S/W pt's current nurse he states that he will have [employer] cxr and given to Dr  [employer]

## 2018-01-18 ENCOUNTER — Other Ambulatory Visit: Payer: Self-pay

## 2018-01-18 ENCOUNTER — Emergency Department (HOSPITAL_COMMUNITY): Payer: Medicare Other

## 2018-01-18 ENCOUNTER — Emergency Department (HOSPITAL_COMMUNITY)
Admission: EM | Admit: 2018-01-18 | Discharge: 2018-01-18 | Disposition: A | Discharge status: Skilled Nursing Facility | Payer: Medicare Other | Attending: Emergency Medicine | Admitting: Emergency Medicine

## 2018-01-18 ENCOUNTER — Encounter (HOSPITAL_COMMUNITY): Payer: Self-pay

## 2018-01-18 DIAGNOSIS — I110 Hypertensive heart disease with heart failure: Secondary | ICD-10-CM | POA: Insufficient documentation

## 2018-01-18 DIAGNOSIS — Z79899 Other long term (current) drug therapy: Secondary | ICD-10-CM | POA: Diagnosis not present

## 2018-01-18 DIAGNOSIS — M4854XA Collapsed vertebra, not elsewhere classified, thoracic region, initial encounter for fracture: Secondary | ICD-10-CM | POA: Diagnosis not present

## 2018-01-18 DIAGNOSIS — Z951 Presence of aortocoronary bypass graft: Secondary | ICD-10-CM | POA: Insufficient documentation

## 2018-01-18 DIAGNOSIS — I5040 Unspecified combined systolic (congestive) and diastolic (congestive) heart failure: Secondary | ICD-10-CM | POA: Insufficient documentation

## 2018-01-18 DIAGNOSIS — N3001 Acute cystitis with hematuria: Secondary | ICD-10-CM | POA: Insufficient documentation

## 2018-01-18 DIAGNOSIS — Z950 Presence of cardiac pacemaker: Secondary | ICD-10-CM | POA: Insufficient documentation

## 2018-01-18 DIAGNOSIS — E039 Hypothyroidism, unspecified: Secondary | ICD-10-CM | POA: Insufficient documentation

## 2018-01-18 DIAGNOSIS — M4856XA Collapsed vertebra, not elsewhere classified, lumbar region, initial encounter for fracture: Secondary | ICD-10-CM | POA: Insufficient documentation

## 2018-01-18 DIAGNOSIS — Z7982 Long term (current) use of aspirin: Secondary | ICD-10-CM | POA: Diagnosis not present

## 2018-01-18 DIAGNOSIS — M549 Dorsalgia, unspecified: Secondary | ICD-10-CM

## 2018-01-18 DIAGNOSIS — Z7902 Long term (current) use of antithrombotics/antiplatelets: Secondary | ICD-10-CM | POA: Insufficient documentation

## 2018-01-18 DIAGNOSIS — K5900 Constipation, unspecified: Secondary | ICD-10-CM | POA: Diagnosis not present

## 2018-01-18 DIAGNOSIS — I2510 Atherosclerotic heart disease of native coronary artery without angina pectoris: Secondary | ICD-10-CM | POA: Insufficient documentation

## 2018-01-18 DIAGNOSIS — Z85828 Personal history of other malignant neoplasm of skin: Secondary | ICD-10-CM | POA: Insufficient documentation

## 2018-01-18 DIAGNOSIS — I10 Essential (primary) hypertension: Secondary | ICD-10-CM | POA: Insufficient documentation

## 2018-01-18 DIAGNOSIS — M545 Low back pain: Secondary | ICD-10-CM | POA: Diagnosis present

## 2018-01-18 LAB — URINALYSIS, ROUTINE W REFLEX MICROSCOPIC
Bilirubin Urine: NEGATIVE
Glucose, UA: NEGATIVE mg/dL
Ketones, ur: NEGATIVE mg/dL
Nitrite: NEGATIVE
Protein, ur: 30 mg/dL — AB
Specific Gravity, Urine: 1.02 (ref 1.005–1.030)
WBC, UA: >50 WBC/hpf — ABNORMAL HIGH (ref 0–5)
pH: 6 (ref 5.0–8.0)

## 2018-01-18 LAB — COMPREHENSIVE METABOLIC PANEL
ALT: 13 U/L (ref 0–44)
AST: 20 U/L (ref 15–41)
Albumin: 3.2 g/dL — ABNORMAL LOW (ref 3.5–5.0)
Alkaline Phosphatase: 91 U/L (ref 38–126)
Anion gap: 9 (ref 5–15)
BUN: 17 mg/dL (ref 8–23)
CO2: 26 mmol/L (ref 22–32)
Calcium: 8.3 mg/dL — ABNORMAL LOW (ref 8.9–10.3)
Chloride: 104 mmol/L (ref 98–111)
Creatinine, Ser: 0.73 mg/dL (ref 0.61–1.24)
GFR calc Af Amer: >60 mL/min (ref >60)
GFR calc non Af Amer: >60 mL/min (ref >60)
Glucose, Bld: 127 mg/dL — ABNORMAL HIGH (ref 70–99)
Potassium: 3.8 mmol/L (ref 3.5–5.1)
Sodium: 139 mmol/L (ref 135–145)
Total Bilirubin: 0.5 mg/dL (ref 0.3–1.2)
Total Protein: 6.7 g/dL (ref 6.5–8.1)

## 2018-01-18 LAB — BRAIN NATRIURETIC PEPTIDE: B Natriuretic Peptide: 151.6 pg/mL — ABNORMAL HIGH (ref 0.0–100.0)

## 2018-01-18 LAB — CBC WITH DIFFERENTIAL/PLATELET
Abs Immature Granulocytes: 0.05 10*3/uL (ref 0.00–0.07)
Basophils Absolute: 0.1 10*3/uL (ref 0.0–0.1)
Basophils Relative: 1 %
Eosinophils Absolute: 0.1 10*3/uL (ref 0.0–0.5)
Eosinophils Relative: 2 %
HCT: 36.9 % — ABNORMAL LOW (ref 39.0–52.0)
Hemoglobin: 11.6 g/dL — ABNORMAL LOW (ref 13.0–17.0)
Immature Granulocytes: 1 %
Lymphocytes Relative: 18 %
Lymphs Abs: 1.4 10*3/uL (ref 0.7–4.0)
MCH: 30.9 pg (ref 26.0–34.0)
MCHC: 31.4 g/dL (ref 30.0–36.0)
MCV: 98.4 fL (ref 80.0–100.0)
Monocytes Absolute: 0.9 10*3/uL (ref 0.1–1.0)
Monocytes Relative: 11 %
Neutro Abs: 5.4 10*3/uL (ref 1.7–7.7)
Neutrophils Relative %: 67 %
Platelets: 260 10*3/uL (ref 150–400)
RBC: 3.75 MIL/uL — ABNORMAL LOW (ref 4.22–5.81)
RDW: 14.4 % (ref 11.5–15.5)
WBC: 7.9 10*3/uL (ref 4.0–10.5)
nRBC: 0 % (ref 0.0–0.2)

## 2018-01-18 LAB — I-STAT TROPONIN, ED: Troponin i, poc: 0.02 ng/mL (ref 0.00–0.08)

## 2018-01-18 LAB — LIPASE, BLOOD: Lipase: 23 U/L (ref 11–51)

## 2018-01-18 MED ORDER — IOPAMIDOL (ISOVUE-300) INJECTION 61%
100.0000 mL | Freq: Once | INTRAVENOUS | Status: AC | PRN
  Administered 2018-01-18: 100 mL via INTRAVENOUS

## 2018-01-18 MED ORDER — ROPINIROLE HCL 0.5 MG PO TABS
0.5000 mg | ORAL_TABLET | Freq: Once | ORAL | Status: AC
  Administered 2018-01-18: 0.5 mg via ORAL
  Filled 2018-01-18: qty 1

## 2018-01-18 MED ORDER — MORPHINE SULFATE (PF) 4 MG/ML IV SOLN
4.0000 mg | Freq: Once | INTRAVENOUS | Status: AC
  Administered 2018-01-18: 4 mg via INTRAVENOUS
  Filled 2018-01-18: qty 1

## 2018-01-18 MED ORDER — IOPAMIDOL (ISOVUE-300) INJECTION 61%
INTRAVENOUS | Status: AC
  Filled 2018-01-18: qty 100

## 2018-01-18 MED ORDER — HYDROMORPHONE HCL 1 MG/ML IJ SOLN
0.5000 mg | Freq: Once | INTRAMUSCULAR | Status: AC
  Administered 2018-01-18: 0.5 mg via INTRAVENOUS
  Filled 2018-01-18: qty 1

## 2018-01-18 MED ORDER — SODIUM CHLORIDE (PF) 0.9 % IJ SOLN
INTRAMUSCULAR | Status: AC
  Filled 2018-01-18: qty 50

## 2018-01-18 MED ORDER — POLYETHYLENE GLYCOL 3350 17 G PO PACK: PACK | ORAL | 0 refills | Status: DC

## 2018-01-18 NOTE — ED Notes (Addendum)
[facility] called for transport.  

## 2018-01-18 NOTE — ED Notes (Signed)
Patient transported to CT 

## 2018-01-18 NOTE — ED Notes (Signed)
6ML Bladder Scan

## 2018-01-18 NOTE — ED Notes (Signed)
Patient transported to X-ray 

## 2018-01-18 NOTE — ED Triage Notes (Signed)
Patient arrived via [facility]. Patient is form [name]. Back pain for 3 weeks, has developed abdominal pain today. Patient has had decreased food intake and decreased bowel and bladder movements for past 24 hours. Patient pain in  Back and Abdominal pain is 6. Patient is AOx4 and uses walker for mobility.

## 2018-01-18 NOTE — Discharge Instructions (Addendum)
Continue to take your home medicine as needed for pain.  It may be worth having a discussion with your physician about increasing or changing your pain medicines if they are no longer effective.  Wear your back brace as recommended by your spine surgeon.  Follow-up with Dr. Annette Stable (with neurosurgery) for reevaluation of your compression fractures.  You can also follow-up with your orthopedic spine specialist.  You can apply ice or heat, whichever feels best for 20 minutes at a time 3 times daily.  Do not fall asleep with a heating pad as this can cause burns to the skin.  Your CT scan showed constipation.  Take 7 capfuls of MiraLAX in 32 ounces of water tomorrow.  After that you can take 1-2 capfuls of MiraLAX daily.  Try to increase your water intake and activity levels.  Your urine showed possible urinary tract infection.  Your urine culture will result in approximately 48 hours and you may receive a phone call if your culture grows bacteria suggestive of an infection.  For now we have decided to hold off on treating with any antibiotics  Return to the emergency department if any concerning signs or symptoms develop such as high fevers, weakness, uncontrolled pain, or incontinence.

## 2018-01-18 NOTE — Progress Notes (Signed)
Orthopedic Tech Progress Note Patient Details:  [name] Oct 20, 1926 [id number]  Brace already called in the RN. Patient ID: Adam [name], male   DOB: 1926/07/23, 84 y.o.   MRN: [id number]   Ladell Pier [facility] 01/18/2018, 10:54 PM

## 2018-01-18 NOTE — ED Provider Notes (Signed)
[facility] DEPT Provider Note   CSN: [id number] Arrival date & time: 01/18/18  1534     History   Chief Complaint Chief Complaint  Patient presents with  . Back Pain    HPI Adam [name] is a 84 y.o. male with history of arthritis, CAD, CHF, embolic cerebral infarction, GERD, heart murmur, HLD, HTN, "silent" MI, status post CABG x4 and implantable pacemaker presents from [facility] for evaluation of multiple complaints.  He reports that for the past 2 to 3 weeks he has had back pain which began around the neck and has since spread "all over ".  He reports the pain is aching, constant, worsens with movement.  Has chronic indwelling catheter and reports that he sometimes feels he has difficulty passing urine but denies any incontinence, fevers, or history of cancer.  No history of IV drug use.  He takes hydrocodone at his facility with some relief.  He also notes shortness of breath and dyspnea on exertion, denies chest pain.  He typically ambulates with the aid of a Rollator.   Patient states that he developed lower abdominal pain yesterday or the day before, he is unsure.  Pain is severe.  He denies nausea or vomiting.  Endorses constipation but denies melena, hematochezia, or hematuria.  Family came to the bedside during my assessment.  They report that he was seen and evaluated on 01/15/2018 3 days ago by his cardiologist for evaluation of his shortness of breath.  There was some concern that he was volume overloaded and his torsemide and midodrine were increased.  He has since lost 4 pounds in the last 3 days and they report he has had good urine output.  They do note that he has had decreased appetite.  Chest x-ray was performed at that visit which showed T12 compression fracture and L2 compression fracture which were previously unknown to the patient or the family.  They are concerned that his pain medications may be causing  constipation.  The history is provided by the patient.    Past Medical History:  Diagnosis Date  . Arthritis    "minor, back and sometimes knees" (12/15/2012)  . Bradycardia    AFib/SSS s/p St Jude PPM 04/12/2008  . CAD (coronary artery disease) 12/30/2014   CABG (LIMA-LAD, SVG-RCA, SVG-OM in 1996).  07/2009 BMS to SVG-RCA. Cath in 04/2010 with patent stents   . Cardiomyopathy, ischemic 08/25/2012  . CHF (congestive heart failure) ([place])   . Chronic knee pain 12/03/2014  . Combined congestive systolic and diastolic heart failure ([place]) 02/02/2015   Hx EF 41%. BNP 96.8 02/21/15 Torsemide 04/06/15 Na 142, K 4.6, Bun 16, creat 0.89 04/20/14 BNP 111.7, Na 142, K 4.6, Bun 16, creat 0.9   . Depression with anxiety 02/02/2015   02/21/15 Hgb A1c 5.8 03/10/15 MMSE 30/30   . Dizziness, after diuretic asscoiated with hypotension and responded to fluid bolus 06/05/2011   04/28/15 US carotid R+L normal bilateral arterial velocities.    Marland Kitchen Dyspnea 08/11/2014   Followed in Pulmonary clinic/ [facility]/ [place]  - 08/11/2014  Walked RA x 1 laps @ 185 ft each stopped due to fatigue/off balance/ slow pace/  no sob or desat  - PFT's  09/26/2014  FEV1 2.26 (85 % ) ratio 76  p no % improvement from saba with DLCO  67 % corrects to 93 % for alv volume      Since prev study 08/04/13 minimal change lung vol or dlco    .  Embolic cerebral infarction ([place]) 12/06/2015  . Exertional shortness of breath    "sometimes walking" (12/15/2012)  . GERD (gastroesophageal reflux disease)   . Gout 02/09/2015  . Heart murmur    "just told I had one today" (12/15/2012)  . Hiatal hernia   . Hyperlipidemia   . Hypertension   . Hypothyroid   . Influenza A 03/10/2016  . Insomnia   . Melanoma of back ([place]) 1976  . Myocardial infarction [facility]) 1996; 2011   "both silent" (12/15/2012)  . Nonrheumatic aortic valve stenosis   . Orthostatic hypotension   . Osteoporosis, senile   . Pacemaker   . RBBB   . Restless leg 02/02/2015  . Right leg  weakness 12/06/2015  . [facility] spotted fever   . S/P CABG x 4   . Sick sinus syndrome ([place]) 01/31/2014  . Sustained ventricular tachycardia ([place]) 07/27/2014  . Urinary retention 12/30/2014    Patient Active Problem List   Diagnosis Date Noted  . Palpitations   . Coronary artery disease involving autologous vein coronary bypass graft without angina pectoris 11/08/2017  . Spondylosis of cervical region without myelopathy or radiculopathy 09/25/2017  . Tear of left rotator cuff 09/25/2017  . Pharyngeal dysphagia 09/08/2017  . Bacteremia   . Generalized weakness 08/25/2017  . Shoulder pain, bilateral 08/25/2017  . Neurogenic bladder 07/21/2017  . Mild cognitive impairment 07/21/2017  . Neurogenic orthostatic hypotension ([place]) 04/21/2017  . Decubitus ulcer of sacral region, stage 2 ([place]) 06/11/2016  . Osteoarthritis 04/23/2016  . Gingivitis 04/15/2016  . Weak 03/14/2016  . AKI (acute kidney injury) ([place]) 03/11/2016  . History of arterial ischemic stroke 03/05/2016  . Rash and nonspecific skin eruption 02/27/2016  . Nonrheumatic aortic valve stenosis   . TIA (transient ischemic attack) 01/10/2016  . Hx of CABG 01/10/2016  . Urinary tract infection, acute 04/06/2015  . Insomnia 03/23/2015  . Gout 02/09/2015  . Depression with anxiety 02/02/2015  . RLS (restless legs syndrome) 02/02/2015  . Combined congestive systolic and diastolic heart failure ([place]) 02/02/2015  . Urinary frequency 02/02/2015  . Pressure ulcer 01/01/2015  . Urinary retention 12/30/2014  . Coronary artery disease involving native coronary artery of native heart without angina pectoris 12/30/2014  . Expressive aphasia 12/30/2014  . Chronic constipation   . Orthostatic hypotension   . Chronic knee pain 12/03/2014  . GERD (gastroesophageal reflux disease)   . Dyspnea on exertion 08/11/2014  . Hypothyroidism 08/11/2014  . VT (ventricular tachycardia) ([place]) 07/27/2014  . Upper airway cough syndrome  07/14/2014  . SSS (sick sinus syndrome) ([place]) 01/31/2014  . Hyperlipidemia 01/30/2014  . Cardiomyopathy, ischemic 08/25/2012  . Pacemaker 08/25/2012  . Dizzy 06/05/2011  . Bradycardia     Past Surgical History:  Procedure Laterality Date  . CARDIAC CATHETERIZATION  04/2010   LIMA to LAD patent,SVG to OM patent,no in-stnet restenosis RCA  . CATARACT EXTRACTION W/ INTRAOCULAR LENS  IMPLANT, BILATERAL Bilateral 2012  . CORONARY ANGIOPLASTY WITH STENT PLACEMENT  07/2009   bare metal stent to SVG to the RCA  . CORONARY ARTERY BYPASS GRAFT  1996   LIMA to LAD,SVG to RCA & SVG to OM  . INSERT / REPLACE / REMOVE PACEMAKER  2010  . MELANOMA EXCISION  05/1974 X2   "taken off my back" (12/15/2012)  . NM MYOVIEW LTD  06/2011   low risk  . PPM GENERATOR CHANGEOUT N/A 01/22/2017   Procedure: PPM GENERATOR CHANGEOUT;  Surgeon: [name], MD;  Location: [facility];  Service: Cardiovascular;  Laterality: N/A;  . TONSILLECTOMY  1938  . TRANSURETHRAL RESECTION OF PROSTATE  1986  . US ECHOCARDIOGRAPHY  07/11/2009   EF 45-50%        Home Medications    Prior to Admission medications   Medication Sig Start Date End Date Taking? Authorizing Provider  acetaminophen (TYLENOL) 500 MG tablet Take 500 mg by mouth 3 (three) times daily.    Yes [provider]  Amino Acids-Protein Hydrolys (FEEDING SUPPLEMENT, PRO-STAT SUGAR FREE 64,) LIQD Take 30 mLs by mouth daily.   Yes [provider]  amiodarone (PACERONE) 100 MG tablet Take 1 tablet (100 mg total) by mouth daily. 01/16/17  Yes Croitoru, Mihai, MD  aspirin EC 81 MG tablet Take 1 tablet (81 mg total) by mouth daily. 01/12/16  Yes Reyne Dumas, MD  Cholecalciferol (VITAMIN D3) 5000 UNITS CAPS Take 5,000 Units by mouth every Monday.    Yes [provider]  clopidogrel (PLAVIX) 75 MG tablet TAKE 1 TABLET ONCE DAILY. Patient taking differently: Take 75 mg by mouth daily.  06/13/17  Yes Blanchie Serve, MD  Droxidopa  100 MG CAPS Take 100 mg by mouth 3 (three) times daily. 08/12/17  Yes Croitoru, Mihai, MD  guaiFENesin (ROBITUSSIN) 100 MG/5ML SOLN Take 5 mLs (100 mg total) by mouth every 4 (four) hours as needed for cough or to loosen phlegm. 09/04/17  Yes Lavina Hamman, MD  HYDROcodone-acetaminophen (NORCO/VICODIN) 5-325 MG tablet Take 1-2 tablets by mouth every 4 (four) hours as needed.    Yes [provider]  HYDROcodone-acetaminophen (NORCO/VICODIN) 5-325 MG tablet Take 1 tablet by mouth 2 (two) times daily.   Yes [provider]  levothyroxine (SYNTHROID, LEVOTHROID) 100 MCG tablet Take 100 mcg by mouth daily before breakfast.   Yes [provider]  Lidocaine 4 % PTCH Apply 1 patch topically daily. Left shoulder   Yes [provider]  meclizine (ANTIVERT) 25 MG tablet Take 1 tablet (25 mg total) by mouth 2 (two) times daily as needed for dizziness. 09/04/17  Yes Lavina Hamman, MD  Melatonin 3 MG TABS Take 3 mg by mouth at bedtime.   Yes [provider]  methocarbamol (ROBAXIN) 500 MG tablet Take 250 mg by mouth 2 (two) times daily.    Yes [provider]  midodrine (PROAMATINE) 10 MG tablet Take 1 tablet (10 mg total) by mouth 3 (three) times daily. 01/15/18  Yes Lendon Colonel, NP  Multiple Vitamins-Minerals (DECUBI-VITE) CAPS Take 1 capsule by mouth daily.   Yes [provider]  NON FORMULARY Take 1 Container by mouth daily.   Yes [provider]  NONFORMULARY OR COMPOUNDED ITEM Nizoral 2% cream mixed with Cutivate 0.05% 1:1 ratio: Apply to affected areas of the buttocks 2 times a day and as needed for rash   Yes [provider]  pantoprazole (PROTONIX) 40 MG tablet TAKE 1 TABLET BY MOUTH TWICE DAILY. Patient taking differently: Take 40 mg by mouth 2 (two) times daily.  02/28/17  Yes Kilroy, Luke K, PA-C  pravastatin (PRAVACHOL) 40 MG tablet Take 1 tablet (40 mg total) by mouth at bedtime. 10/21/16  Yes Pandey, Mahima, MD   rOPINIRole (REQUIP) 0.5 MG tablet TAKE 1 TABLET 3 TIMES A DAY AS NEEDED. Patient taking differently: Take 0.5 mg by mouth 3 (three) times daily as needed (restless leg syndrome).  07/08/17  Yes Blanchie Serve, MD  sennosides-docusate sodium (SENOKOT-S) 8.6-50 MG tablet Take 2 tablets by mouth at  bedtime.    Yes [provider]  torsemide (DEMADEX) 10 MG tablet Take 1 tablet (10 mg total) by mouth daily. 01/15/18  Yes Lendon Colonel, NP  polyethylene glycol [facility] / GLYCOLAX) packet Take 7 capfuls in 32 ounces of water once.  After that you can take 1-2 capfuls in an 8 ounce glass of water daily. 01/18/18   Renita Papa, PA-C    Family History Family History  Problem Relation Age of Onset  . Coronary artery disease Mother   . Diabetes Mother   . Heart disease Mother   . Coronary artery disease Father   . Diabetes Father   . Lung cancer Father   . Heart disease Father   . Anuerysm Son   . Heart disease Brother     Social History Social History   Tobacco Use  . Smoking status: Never Smoker  . Smokeless tobacco: Never Used  Substance Use Topics  . Alcohol use: No    Alcohol/week: 0.0 standard drinks  . Drug use: No     Allergies   Altace [ramipril]; Crestor [rosuvastatin calcium]; Penicillins; Sulfa antibiotics; and Sulfamethoxazole-trimethoprim   Review of Systems Review of Systems  Constitutional: Positive for fatigue. Negative for chills and fever.  Respiratory: Positive for shortness of breath.   Gastrointestinal: Positive for abdominal pain and constipation. Negative for nausea and vomiting.  Musculoskeletal: Positive for back pain.  All other systems reviewed and are negative.    Physical Exam Updated Vital Signs BP (!) 143/71 (BP Location: Left Arm)   Pulse 70   Temp 97.9 F (36.6 C) (Oral)   Resp 16   Ht 5\' 10"  (1.778 m)   Wt 80.1 kg   SpO2 94%   BMI 25.34 kg/m   Physical Exam Vitals signs and nursing note reviewed.  Constitutional:       General: He is not in acute distress.    Appearance: He is well-developed.  HENT:     Head: Normocephalic and atraumatic.  Eyes:     General:        Right eye: No discharge.        Left eye: No discharge.     Conjunctiva/sclera: Conjunctivae normal.  Neck:     Musculoskeletal: Neck supple.     Vascular: No JVD.     Trachea: No tracheal deviation.     Comments: No midline spine TTP, no paraspinal muscle tenderness, no deformity, crepitus, or step-off noted  Cardiovascular:     Rate and Rhythm: Normal rate.     Heart sounds: Murmur present.  Pulmonary:     Effort: Pulmonary effort is normal.     Comments: Midline sternotomy scar.  Well-healed.  Bibasilar crackles. Chest:     Chest wall: No tenderness.  Abdominal:     General: Bowel sounds are increased. There is no distension.     Tenderness: There is abdominal tenderness in the right lower quadrant, periumbilical area, suprapubic area and left lower quadrant. There is no guarding or rebound.  Musculoskeletal:        General: Tenderness present.     Right lower leg: Edema present.     Left lower leg: Edema present.     Comments: Trace pitting edema of the bilateral lower extremities.  Patient with diffuse lower thoracic and lumbar spine tenderness with bilateral parathoracic and paralumbar tenderness.  No deformity, crepitus, or step-off noted.  5/5 strength of BUE and BLE major muscle groups.  Skin:    General: Skin  is warm and dry.     Findings: No erythema.  Neurological:     Mental Status: He is alert.  Psychiatric:        Behavior: Behavior normal.      ED Treatments / Results  Labs (all labs ordered are listed, but only abnormal results are displayed) Labs Reviewed  COMPREHENSIVE METABOLIC PANEL - Abnormal; Notable for the following components:      Result Value   Glucose, Bld 127 (*)    Calcium 8.3 (*)    Albumin 3.2 (*)    All other components within normal limits  CBC WITH DIFFERENTIAL/PLATELET -  Abnormal; Notable for the following components:   RBC 3.75 (*)    Hemoglobin 11.6 (*)    HCT 36.9 (*)    All other components within normal limits  URINALYSIS, ROUTINE W REFLEX MICROSCOPIC - Abnormal; Notable for the following components:   APPearance CLOUDY (*)    Hgb urine dipstick SMALL (*)    Protein, ur 30 (*)    Leukocytes, UA LARGE (*)    WBC, UA >50 (*)    Bacteria, UA MANY (*)    All other components within normal limits  BRAIN NATRIURETIC PEPTIDE - Abnormal; Notable for the following components:   B Natriuretic Peptide 151.6 (*)    All other components within normal limits  URINE CULTURE  LIPASE, BLOOD  I-STAT TROPONIN, ED    EKG EKG Interpretation  Date/Time:  Sunday January 18 2018 16:46:37 EST Ventricular Rate:  70 PR Interval:    QRS Duration: 194 QT Interval:  500 QTC Calculation: 540 R Axis:   -36 Text Interpretation:  Atrial-paced rhythm Right bundle branch block Abnormal T, consider ischemia, lateral leads No significant change since last tracing Confirmed by Dorie Rank [id number]) on 01/18/2018 5:03:58 PM   Radiology Dg Chest 2 View  Result Date: 01/18/2018 CLINICAL DATA:  Back pain for 3 weeks. New onset abdominal pain. Shortness of breath. EXAM: CHEST - 2 VIEW COMPARISON:  Chest radiograph 01/15/2018 FINDINGS: Unchanged position of left chest wall pacemaker leads. There is moderate cardiomegaly with aortic atherosclerosis and findings of prior median sternotomy. There is a large hiatal hernia. Left basilar atelectasis without other consolidation. No pulmonary edema. No pneumothorax. Unchanged lower thoracic compression deformity. IMPRESSION: 1. No active cardiopulmonary disease. 2. Cardiomegaly and aortic atherosclerosis. 3. Large hiatal hernia. 4. Unchanged appearance of lower thoracic compression deformity. Electronically Signed   By: Ulyses Jarred M.D.   On: 01/18/2018 17:36   Ct Abdomen Pelvis W Contrast  Result Date: 01/18/2018 CLINICAL DATA:  Back  pain, abdominal pain EXAM: CT ABDOMEN AND PELVIS WITH CONTRAST TECHNIQUE: Multidetector CT imaging of the abdomen and pelvis was performed using the standard protocol following bolus administration of intravenous contrast. CONTRAST:  153mL ISOVUE-300 IOPAMIDOL (ISOVUE-300) INJECTION 61% COMPARISON:  11/11/2017 FINDINGS: Lower chest: Large hiatal hernia containing much of the stomach. Cardiomegaly. Compressive atelectasis in the left lower lobe. Small bilateral pleural effusions. Hepatobiliary: No focal hepatic abnormality. Gallbladder unremarkable. Pancreas: No focal abnormality or ductal dilatation. Spleen: No focal abnormality.  Normal size. Adrenals/Urinary Tract: Cysts in the kidneys bilaterally, the largest 5 cm in the lower pole of the right kidney. These are stable. No hydronephrosis. Adrenal glands unremarkable. Urinary bladder decompressed with Foley catheter in place. Stomach/Bowel: Moderate stool burden throughout the colon. No evidence of bowel obstruction. Vascular/Lymphatic: Aortic atherosclerosis. No enlarged abdominal or pelvic lymph nodes. Reproductive: No visible focal abnormality. Other: No free fluid or free air. Musculoskeletal: Moderate  compression fracture involving the T11 and L3 vertebral bodies. These are new since prior CT. Diffuse degenerative disc disease. IMPRESSION: New T11 and L3 compression fractures since prior CT. Large hiatal hernia. Small bilateral effusions, cardiomegaly. Bilateral renal cysts, stable. Moderate stool burden in the colon. Aortic atherosclerosis. Electronically Signed   By: Rolm Baptise M.D.   On: 01/18/2018 19:02   Ct L-spine No Charge  Result Date: 01/18/2018 CLINICAL DATA:  Back pain for 3 weeks EXAM: CT LUMBAR SPINE WITHOUT CONTRAST TECHNIQUE: Multidetector CT imaging of the lumbar spine was performed without intravenous contrast administration. Multiplanar CT image reconstructions were also generated. COMPARISON:  CT abdomen pelvis 11/11/2017 FINDINGS:  Segmentation: Normal Alignment: Right convex scoliosis apex at L3. Vertebrae: Acute compression fracture at T11 with approximately 50% height loss, involving the superior endplate and anterior wall. Approximately 7 mm retropulsion. Acute or subacute compression fracture of L3 with approximately 50% height loss and no retropulsion. No other focal vertebral body lesion. Paraspinal and other soft tissues: Bilateral renal cysts, measuring up to 0.5 cm. Small right pleural effusion. Disc levels: T10-11: Unremarkable. T11-12: Retropulsion of the posterior wall of T11 causes mild spinal canal stenosis. T12-L1: No stenosis. L1-L2: Mild disc bulge and endplate spurring without stenosis. L2-3: Mild disc bulge and endplate spurring with moderate left foraminal stenosis, partially due to scoliotic curvature. L3-4: No spinal canal or neural foraminal stenosis. L4-5: Normal. L5-S1: Normal. IMPRESSION: 1. Acute compression fracture at T11 with approximately 50% height loss and 7 mm retropulsion. Mild spinal canal stenosis. 2. Acute or subacute compression fracture of L3 with 50% height loss and no retropulsion. 3. Moderate left L2-3 neural foraminal stenosis. Electronically Signed   By: Ulyses Jarred M.D.   On: 01/18/2018 19:09    Procedures Procedures (including critical care time)  Medications Ordered in ED Medications  iopamidol (ISOVUE-300) 61 % injection (  Hold 01/18/18 1758)  sodium chloride (PF) 0.9 % injection (0 mLs  Hold 01/18/18 1758)  morphine 4 MG/ML injection 4 mg (4 mg Intravenous Given 01/18/18 1703)  iopamidol (ISOVUE-300) 61 % injection 100 mL (100 mLs Intravenous Contrast Given 01/18/18 1805)  rOPINIRole (REQUIP) tablet 0.5 mg (0.5 mg Oral Given 01/18/18 2054)  HYDROmorphone (DILAUDID) injection 0.5 mg (0.5 mg Intravenous Given 01/18/18 2054)     Initial Impression / Assessment and Plan / ED Course  I have reviewed the triage vital signs and the nursing notes.  Pertinent labs & imaging  results that were available during my care of the patient were reviewed by me and considered in my medical decision making (see chart for details).     Patient presenting for evaluation of worsening back pain and abdominal pain.  He is afebrile, vital signs are stable.  He is nontoxic in appearance.  He has been experiencing back pain for the last 2 to 3 weeks with abdominal pain beginning sometime yesterday.  He is a poor historian but at recent cardiology visit it was discovered that he had some compression fractures that he and family did not know about.  We will get pain medicine, obtain lab work and imaging, and reassess.  Lab work reviewed by me shows mild anemia, no leukocytosis or metabolic derangements.  No renal insufficiency.  Chest x-ray shows no active cardiopulmonary disease but does show cardiomegaly and aortic atherosclerosis as well as a large hiatal hernia.  It appears improved compared to chest x-ray performed 3 days ago at cardiologist office.  He has longstanding history of shortness of breath but  has no tachypnea or hypoxia on examination today.  Troponin is negative and he is not complaining of any chest pain.  EKG shows no significant changes from last tracing.  Doubt ACS/MI.  Doubt PE.  CT scan of the abdomen and pelvis shows moderate stool burden, no acute surgical abdominal pathology.  Dedicated images to the lumbar spine show an acute compression fracture at T11 with approximately 50% height loss and 7 mm of retropulsion as well as acute or possibly subacute compression fracture of L3 with 50% height loss and no retropulsion.  I spoke with Dr. Trenton Gammon with neurosurgery who recommends a lightweight TLSO brace, rehabilitation, pain management, and follow-up with neurosurgery on an outpatient basis for discussion of possible vertebroplasty.  Patient's UA suggests UTI however he has a chronic indwelling catheter and most urine cultures do not grow any significant bacteria.  Unsure of  the source of the urine, RN or tech may have collected it from the urine collection bag itself which would not be considered a clean catch.  I did discuss with patient and his family and shared decision making was had, they elect to hold off on treatment of UTI at this time.  His urine will be cultured.  On reevaluation patient is resting comfortably in no apparent distress.  His pain is improved somewhat.  I suspect the majority of his abdominal discomfort is secondary to constipation due to his narcotic use.  He feels comfortable with discharge back to his assisted living facility who can manage his pain and initiate rehab based on his needs.  He was given a TLSO brace in the ED.  Discussed bowel regimen and the importance of fluid rehydration and fiber intake.  He is also taking a stool softener.  He has an appointment with his orthopedist tomorrow.  Recommend follow-up with cardiology for reevaluation of shortness of breath, neurosurgery or orthopedic spine specialty for reevaluation of back pain.  Discuss strict ED return precautions.  Patient, his daughter, and his wife verbalized understanding of and agreement with plan and patient is stable for discharge at this time.  Patient seen and evaluated by Dr. Tomi Bamberger who agrees with assessment and plan at this time.  With regards to patient's urine culture, family would like results to be called to the skilled nursing office at the patient's facility at [phone] ext [phone].  Final Clinical Impressions(s) / ED Diagnoses   Final diagnoses:  Back pain  Non-traumatic compression fracture of eleventh thoracic vertebra, initial encounter ([place])  Non-traumatic compression fracture of third lumbar vertebra, initial encounter ([place])  Constipation, unspecified constipation type  Acute cystitis with hematuria    ED Discharge Orders         Ordered    polyethylene glycol (MIRALAX / GLYCOLAX) packet     01/18/18 2059           Renita Papa, PA-C 01/19/18  0100    Dorie Rank, MD 01/19/18 1510

## 2018-01-18 NOTE — ED Notes (Signed)
Bio med tech at bedside

## 2018-01-19 ENCOUNTER — Ambulatory Visit: Payer: Medicare Other | Admitting: Adult Health

## 2018-01-19 ENCOUNTER — Telehealth: Payer: Self-pay | Admitting: Adult Health

## 2018-01-19 LAB — BASIC METABOLIC PANEL
BUN: 16 (ref 4–21)
Creatinine: 0.7 (ref 0.6–1.3)
Glucose: 103
Potassium: 4.6 (ref 3.4–5.3)
Sodium: 139 (ref 137–147)

## 2018-01-19 NOTE — Telephone Encounter (Signed)
New Message   Pt's wife is calling, states she was waiting on a phone call and wants to add another question. They would like to know of the pt needs to see Dr. Sallyanne Kuster or an APP to see if he is strong enough to have the Hypoplasty or can they just be told over the phone. Please call

## 2018-01-19 NOTE — Telephone Encounter (Signed)
Returned call to patient's daughter Dr.Croitoru's recommendation given.She stated Dr.Dahari Rolena Infante at [facility] will be doing Kyphoplasty.

## 2018-01-19 NOTE — Progress Notes (Deleted)
Cardiology Office Note   Date:  01/19/2018   ID:  Adam [name], DOB November 12, 1926, MRN [id number]  PCP:  Wardell Honour, MD  Cardiologist:  Dr. Sallyanne Kuster  No chief complaint on file.    History of Present Illness: Adam [name] is a 84 y.o. male who presents for close follow up after being seen in the office 4 days ago for worsening LEE and dyspnea. He has a history of CAD with BMS 1996, BMS to the SVG to RCA, prior sustained VT, chronic combined systolic and diastolic CHF, ischemic CVA, PPM in situ ([employer]), and severe disabling orthostatic hypotension.   He was seen on 01/15/2018 with what appeared to be fluid overload. He is a resident of [place] and has not been consistently weighed. He complained of early satiety.Mld abdominal pain.   I increased his torsemide to 10 mg daily and placed him on higher dose of midodrine     Past Medical History:  Diagnosis Date  . Arthritis    "minor, back and sometimes knees" (12/15/2012)  . Bradycardia    AFib/SSS s/p [employer] Jude PPM 04/12/2008  . CAD (coronary artery disease) 12/30/2014   CABG (LIMA-LAD, SVG-RCA, SVG-OM in 1996).  07/2009 BMS to SVG-RCA. Cath in 04/2010 with patent stents   . Cardiomyopathy, ischemic 08/25/2012  . CHF (congestive heart failure) ([place])   . Chronic knee pain 12/03/2014  . Combined congestive systolic and diastolic heart failure ([place]) 02/02/2015   Hx EF 41%. BNP 96.8 02/21/15 Torsemide 04/06/15 Na 142, K 4.6, Bun 16, creat 0.89 04/20/14 BNP 111.7, Na 142, K 4.6, Bun 16, creat 0.9   . Depression with anxiety 02/02/2015   02/21/15 Hgb A1c 5.8 03/10/15 MMSE 30/30   . Dizziness, after diuretic asscoiated with hypotension and responded to fluid bolus 06/05/2011   04/28/15 US carotid R+L normal bilateral arterial velocities.    Marland Kitchen Dyspnea 08/11/2014   Followed in Pulmonary clinic/ [facility]/ [place]  - 08/11/2014  Walked RA x 1 laps @ 185 ft each stopped due to fatigue/off balance/ slow pace/  no sob or desat  - PFT's   09/26/2014  FEV1 2.26 (85 % ) ratio 76  p no % improvement from saba with DLCO  67 % corrects to 93 % for alv volume      Since prev study 08/04/13 minimal change lung vol or dlco    . Embolic cerebral infarction ([place]) 12/06/2015  . Exertional shortness of breath    "sometimes walking" (12/15/2012)  . GERD (gastroesophageal reflux disease)   . Gout 02/09/2015  . Heart murmur    "just told I had one today" (12/15/2012)  . Hiatal hernia   . Hyperlipidemia   . Hypertension   . Hypothyroid   . Influenza A 03/10/2016  . Insomnia   . Melanoma of back ([place]) 1976  . Myocardial infarction [facility]) 1996; 2011   "both silent" (12/15/2012)  . Nonrheumatic aortic valve stenosis   . Orthostatic hypotension   . Osteoporosis, senile   . Pacemaker   . RBBB   . Restless leg 02/02/2015  . Right leg weakness 12/06/2015  . [facility] spotted fever   . S/P CABG x 4   . Sick sinus syndrome ([place]) 01/31/2014  . Sustained ventricular tachycardia ([place]) 07/27/2014  . Urinary retention 12/30/2014    Past Surgical History:  Procedure Laterality Date  . CARDIAC CATHETERIZATION  04/2010   LIMA to LAD patent,SVG to OM patent,no in-stnet restenosis RCA  .  CATARACT EXTRACTION W/ INTRAOCULAR LENS  IMPLANT, BILATERAL Bilateral 2012  . CORONARY ANGIOPLASTY WITH STENT PLACEMENT  07/2009   bare metal stent to SVG to the RCA  . CORONARY ARTERY BYPASS GRAFT  1996   LIMA to LAD,SVG to RCA & SVG to OM  . INSERT / REPLACE / REMOVE PACEMAKER  2010  . MELANOMA EXCISION  05/1974 X2   "taken off my back" (12/15/2012)  . NM MYOVIEW LTD  06/2011   low risk  . PPM GENERATOR CHANGEOUT N/A 01/22/2017   Procedure: PPM GENERATOR CHANGEOUT;  Surgeon: [name], MD;  Location: [facility];  Service: Cardiovascular;  Laterality: N/A;  . TONSILLECTOMY  1938  . TRANSURETHRAL RESECTION OF PROSTATE  1986  . US ECHOCARDIOGRAPHY  07/11/2009   EF 45-50%     Current Outpatient Medications  Medication Sig Dispense Refill  .  acetaminophen (TYLENOL) 500 MG tablet Take 500 mg by mouth 3 (three) times daily.     . Amino Acids-Protein Hydrolys (FEEDING SUPPLEMENT, PRO-STAT SUGAR FREE 64,) LIQD Take 30 mLs by mouth daily.    Marland Kitchen amiodarone (PACERONE) 100 MG tablet Take 1 tablet (100 mg total) by mouth daily. 90 tablet 3  . aspirin EC 81 MG tablet Take 1 tablet (81 mg total) by mouth daily. 30 tablet 1  . Cholecalciferol (VITAMIN D3) 5000 UNITS CAPS Take 5,000 Units by mouth every Monday.     . clopidogrel (PLAVIX) 75 MG tablet TAKE 1 TABLET ONCE DAILY. (Patient taking differently: Take 75 mg by mouth daily. ) 90 tablet 1  . Droxidopa 100 MG CAPS Take 100 mg by mouth 3 (three) times daily. 90 capsule 6  . guaiFENesin (ROBITUSSIN) 100 MG/5ML SOLN Take 5 mLs (100 mg total) by mouth every 4 (four) hours as needed for cough or to loosen phlegm. 1200 mL 0  . HYDROcodone-acetaminophen (NORCO/VICODIN) 5-325 MG tablet Take 1-2 tablets by mouth every 4 (four) hours as needed.     Marland Kitchen HYDROcodone-acetaminophen (NORCO/VICODIN) 5-325 MG tablet Take 1 tablet by mouth 2 (two) times daily.    Marland Kitchen levothyroxine (SYNTHROID, LEVOTHROID) 100 MCG tablet Take 100 mcg by mouth daily before breakfast.    . Lidocaine 4 % PTCH Apply 1 patch topically daily. Left shoulder    . meclizine (ANTIVERT) 25 MG tablet Take 1 tablet (25 mg total) by mouth 2 (two) times daily as needed for dizziness. 30 tablet 0  . Melatonin 3 MG TABS Take 3 mg by mouth at bedtime.    . methocarbamol (ROBAXIN) 500 MG tablet Take 250 mg by mouth 2 (two) times daily.     . midodrine (PROAMATINE) 10 MG tablet Take 1 tablet (10 mg total) by mouth 3 (three) times daily.    . Multiple Vitamins-Minerals (DECUBI-VITE) CAPS Take 1 capsule by mouth daily.    . NON FORMULARY Take 1 Container by mouth daily.    . NONFORMULARY OR COMPOUNDED ITEM Nizoral 2% cream mixed with Cutivate 0.05% 1:1 ratio: Apply to affected areas of the buttocks 2 times a day and as needed for rash    . pantoprazole  (PROTONIX) 40 MG tablet TAKE 1 TABLET BY MOUTH TWICE DAILY. (Patient taking differently: Take 40 mg by mouth 2 (two) times daily. ) 60 tablet 11  . polyethylene glycol (MIRALAX / GLYCOLAX) packet Take 7 capfuls in 32 ounces of water once.  After that you can take 1-2 capfuls in an 8 ounce glass of water daily. 14 each 0  . pravastatin (PRAVACHOL)  40 MG tablet Take 1 tablet (40 mg total) by mouth at bedtime. 90 tablet 3  . rOPINIRole (REQUIP) 0.5 MG tablet TAKE 1 TABLET 3 TIMES A DAY AS NEEDED. (Patient taking differently: Take 0.5 mg by mouth 3 (three) times daily as needed (restless leg syndrome). ) 90 tablet 1  . sennosides-docusate sodium (SENOKOT-S) 8.6-50 MG tablet Take 2 tablets by mouth at bedtime.     . torsemide (DEMADEX) 10 MG tablet Take 1 tablet (10 mg total) by mouth daily.     No current facility-administered medications for this visit.     Allergies:   Altace [ramipril]; Crestor [rosuvastatin calcium]; Penicillins; Sulfa antibiotics; and Sulfamethoxazole-trimethoprim    Social History:  The patient  reports that he has never smoked. He has never used smokeless tobacco. He reports that he does not drink alcohol or use drugs.   Family History:  The patient's family history includes Anuerysm in his son; Coronary artery disease in his father and mother; Diabetes in his father and mother; Heart disease in his brother, father, and mother; Lung cancer in his father.    ROS: All other systems are reviewed and negative. Unless otherwise mentioned in H&P    PHYSICAL EXAM: VS:  There were no vitals taken for this visit. , BMI There is no height or weight on file to calculate BMI. GEN: Well nourished, well developed, in no acute distress HEENT: normal Neck: no JVD, carotid bruits, or masses Cardiac: ***RRR; no murmurs, rubs, or gallops,no edema  Respiratory:  Clear to auscultation bilaterally, normal work of breathing GI: soft, nontender, nondistended, + BS MS: no deformity or  atrophy Skin: warm and dry, no rash Neuro:  Strength and sensation are intact Psych: euthymic mood, full affect   EKG:  EKG {ACTION; IS/IS NOB:[id number]} ordered today. The ekg ordered today demonstrates ***   Recent Labs: 08/26/2017: TSH 1.125 08/27/2017: Magnesium 2.1 01/18/2018: ALT 13; B Natriuretic Peptide 151.6; Hemoglobin 11.6; Platelets 260 01/19/2018: BUN 16; Creatinine 0.7; Potassium 4.6; Sodium 139    Lipid Panel    Component Value Date/Time   CHOL 103 05/05/2017 0000   TRIG 129 05/05/2017 0000   HDL 39 (L) 05/05/2017 0000   CHOLHDL 2.6 05/05/2017 0000   VLDL 20 12/07/2015 0449   LDLCALC 43 05/05/2017 0000      Wt Readings from Last 3 Encounters:  01/18/18 176 lb 9.4 oz (80.1 kg)  01/12/18 176 lb 9.6 oz (80.1 kg)  12/10/17 180 lb 14.4 oz (82.1 kg)      Other studies Reviewed: Additional studies/ records that were reviewed today include: ***. Review of the above records demonstrates: ***   ASSESSMENT AND PLAN:  1.  ***   Current medicines are reviewed at length with the patient today.    Labs/ tests ordered today include: *** Phill Myron. [place] Pugh, ANP, AACC   01/19/2018 10:08 AM    [facility] [address] Office [phone] Fax [phone]

## 2018-01-19 NOTE — Telephone Encounter (Signed)
Returned call to patient's daughter she wanted to ask Dr.Croitoru if ok for father to have Kyphoplasty procedure and when does he need to see Dr.Croitoru back in office.Message sent to Dr.Croitoru for advice.

## 2018-01-19 NOTE — Telephone Encounter (Signed)
Kyphoplasty is generally a low risk procedure, as long as he is breathing well. After losing the extra fluid, has his breathing improved? Who would be doing the procedure, so that I can send a note to them? MCr

## 2018-01-20 ENCOUNTER — Encounter: Payer: Self-pay | Admitting: Gastroenterology

## 2018-01-20 LAB — URINE CULTURE: Culture: >=100000 — AB

## 2018-01-20 NOTE — Telephone Encounter (Signed)
AMANDA CALLED BACK, RECEIVED FAXED I&O's. REVIEWED BY KL. SENT TO BE SCANNED

## 2018-01-21 ENCOUNTER — Telehealth: Payer: Self-pay | Admitting: Emergency Medicine

## 2018-01-21 NOTE — Telephone Encounter (Signed)
Post ED Visit - Positive Culture Follow-up  Culture report reviewed by antimicrobial stewardship pharmacist:  []  Elenor Quinones, Pharm.D. []  Heide Guile, Pharm.D., BCPS AQ-ID [x]  Parks Neptune, Pharm.D., BCPS []  Alycia Rossetti, Pharm.D., BCPS []  [place], [place].D., BCPS, AAHIVP []  Legrand Como, Pharm.D., BCPS, AAHIVP []  Salome Arnt, PharmD, BCPS []  Johnnette Gourd, PharmD, BCPS []  Hughes Better, PharmD, BCPS []  Leeroy Cha, PharmD  Positive urine culture Treated with none, asymptomatic, no further patient follow-up is required at this time.  Hazle Nordmann 01/21/2018, 11:13 AM

## 2018-01-21 NOTE — Progress Notes (Signed)
ED Antimicrobial Stewardship Positive Culture Follow Up   Adam [name] is an 84 y.o. male who presented to [facility] on 01/18/2018 with a chief complaint of  Chief Complaint  Patient presents with  . Back Pain    Recent Results (from the past 720 hour(s))  Urine culture     Status: Abnormal   Collection Time: 01/18/18  4:57 PM  Result Value Ref Range Status   Specimen Description   Final    URINE, RANDOM Performed at [facility] [address], [place], [place] [address]    Special Requests   Final    NONE Performed at [facility], [place] [address], [place], [place] [address]    Culture (A)  Final    >=100,000 COLONIES/mL PSEUDOMONAS AERUGINOSA >=100,000 COLONIES/mL METHICILLIN RESISTANT STAPHYLOCOCCUS AUREUS    Report Status 01/20/2018 FINAL  Final   Organism ID, Bacteria PSEUDOMONAS AERUGINOSA (A)  Final   Organism ID, Bacteria METHICILLIN RESISTANT STAPHYLOCOCCUS AUREUS (A)  Final      Susceptibility   Methicillin resistant staphylococcus aureus - MIC*    CIPROFLOXACIN >=8 RESISTANT Resistant     GENTAMICIN <=0.5 SENSITIVE Sensitive     NITROFURANTOIN <=16 SENSITIVE Sensitive     OXACILLIN >=4 RESISTANT Resistant     TETRACYCLINE >=16 RESISTANT Resistant     VANCOMYCIN 1 SENSITIVE Sensitive     TRIMETH/SULFA <=10 SENSITIVE Sensitive     CLINDAMYCIN <=0.25 SENSITIVE Sensitive     RIFAMPIN <=0.5 SENSITIVE Sensitive     Inducible Clindamycin NEGATIVE Sensitive     * >=100,000 COLONIES/mL METHICILLIN RESISTANT STAPHYLOCOCCUS AUREUS   Pseudomonas aeruginosa - MIC*    CEFTAZIDIME 2 SENSITIVE Sensitive     CIPROFLOXACIN <=0.25 SENSITIVE Sensitive     GENTAMICIN 2 SENSITIVE Sensitive     IMIPENEM 1 SENSITIVE Sensitive     PIP/TAZO <=4 SENSITIVE Sensitive     CEFEPIME 8 SENSITIVE Sensitive     * >=100,000 COLONIES/mL PSEUDOMONAS AERUGINOSA   No abx indicated  ED Provider: Okey Regal, PA-C  Wynell Balloon 01/21/2018, 8:11  AM Clinical Pharmacist Monday - Friday phone -  [phone] Saturday - Sunday phone - [phone]

## 2018-01-22 ENCOUNTER — Encounter: Payer: Self-pay | Admitting: Gastroenterology

## 2018-01-22 ENCOUNTER — Ambulatory Visit: Payer: Medicare Other | Admitting: Gastroenterology

## 2018-01-22 VITALS — BP 104/60 | HR 72 | Ht 70.0 in | Wt 172.0 lb

## 2018-01-22 DIAGNOSIS — K5909 Other constipation: Secondary | ICD-10-CM | POA: Diagnosis not present

## 2018-01-22 DIAGNOSIS — R630 Anorexia: Secondary | ICD-10-CM

## 2018-01-22 MED ORDER — PLECANATIDE 3 MG PO TABS: 3.0000 mg | ORAL_TABLET | Freq: Every day | ORAL | 11 refills | Status: DC

## 2018-01-22 NOTE — Patient Instructions (Addendum)
If you are age 82 or older, your body mass index should be between 23-30. Your Body mass index is 24.68 kg/m. If this is out of the aforementioned range listed, please consider follow up with your Primary Care Provider.  If you are age 51 or younger, your body mass index should be between 19-25. Your Body mass index is 24.68 kg/m. If this is out of the aformentioned range listed, please consider follow up with your Primary Care Provider.   We have sent the following medications to your pharmacy for you to pick up at your convenience:Trulance 3mg  daily.  Stop taking your Miralax now.  It was a pleasure to see you today!  Vito Cirigliano, D.O.

## 2018-01-22 NOTE — Progress Notes (Signed)
Chief Complaint: Constipation   Referring Provider:     Wardell Honour, MD    HPI:     Adam [name] is a 84 y.o. male with a history of CAD with prior stent placement and CABG in 3903, systolic/diastolic CHF (EF 00%), ischemic CVA, pacemaker, CVA, GERD, hiatal hernia, orthostatic hypotension referred to the [facility]ology [facility] for evaluation of changes in bowel habits/constipation and decreased appetite. He is a resident of [place].  With his wife and son-in-law who assist in providing the history.  He states he has had chronic constipation for at least 2 years, now requiring Miralax near daily.  Stools essentially ranged from constipation to watery stools without any normal, formed, soft stools; "just one extreme or the other ".  Has been on pain medications for years as well.  He was previously treated with Amitiza but when dose was increased from 8 mcg to 24 mcg he had diarrhea and stopped medication.  He reports nausea and decreased appetite which improve with BM.  He reports abdominal pain last week when he was in the emergency department which has since resolved.  He is otherwise without hematochezia, melena and no emesis.   Was seen in the ER on 01/18/2018 for back pain.  At that time he additionally endorsed lower abdominal pain x1 to 2 days described as severe and without nausea, vomiting, but endorsed constipation without melena, hematochezia.  Constipation was thought to be secondary to recently prescribed pain medications for T12 and L2 compression fractures noted on chest x-ray on 01/15/2018.  Labs in the ER notable for normal CMP (albumin 3.2), WBC 7.9, hemoglobin/hematocrit 11.6/36.9 with MCV/RDW 98.4/14.4, platelet 260.  Normal lipase, troponin negative.  Chest x-ray demonstrating large hiatal hernia and lower thoracic compression deformity without acute cardiopulmonary disease.  CT with new T11 and L3 compression fractures, large hiatal hernia, small  bilateral effusions and moderate stool burden in the colon.  Discharged home with plan for follow-up with neurosurgery for discussion of possible vertebroplasty.  Currently prescribed lidocaine patch to shoulder area and Norco 1 to 2 tablets every 4 hours as needed for pain.  Hemoglobin historically between low 12's and mid to upper 13's since 2015.  Past Medical History:  Diagnosis Date  . Arthritis    "minor, back and sometimes knees" (12/15/2012)  . Bradycardia    AFib/SSS s/p St Jude PPM 04/12/2008  . CAD (coronary artery disease) 12/30/2014   CABG (LIMA-LAD, SVG-RCA, SVG-OM in 1996).  07/2009 BMS to SVG-RCA. Cath in 04/2010 with patent stents   . Cardiomyopathy, ischemic 08/25/2012  . CHF (congestive heart failure) ([place])   . Chronic knee pain 12/03/2014  . Combined congestive systolic and diastolic heart failure ([place]) 02/02/2015   Hx EF 41%. BNP 96.8 02/21/15 Torsemide 04/06/15 Na 142, K 4.6, Bun 16, creat 0.89 04/20/14 BNP 111.7, Na 142, K 4.6, Bun 16, creat 0.9   . Depression with anxiety 02/02/2015   02/21/15 Hgb A1c 5.8 03/10/15 MMSE 30/30   . Dizziness, after diuretic asscoiated with hypotension and responded to fluid bolus 06/05/2011   04/28/15 US carotid R+L normal bilateral arterial velocities.    Marland Kitchen Dyspnea 08/11/2014   Followed in Pulmonary clinic/ [facility]/ [place]  - 08/11/2014  Walked RA x 1 laps @ 185 ft each stopped due to fatigue/off balance/ slow pace/  no sob or desat  - PFT's  09/26/2014  FEV1 2.26 (85 % )  ratio 76  p no % improvement from saba with DLCO  67 % corrects to 93 % for alv volume      Since prev study 08/04/13 minimal change lung vol or dlco    . Embolic cerebral infarction ([place]) 12/06/2015  . Exertional shortness of breath    "sometimes walking" (12/15/2012)  . GERD (gastroesophageal reflux disease)   . Gout 02/09/2015  . Heart murmur    "just told I had one today" (12/15/2012)  . Hiatal hernia   . Hyperlipidemia   . Hypertension   . Hypothyroid   . Influenza A  03/10/2016  . Insomnia   . Melanoma of back ([place]) 1976  . Myocardial infarction [facility]) 1996; 2011   "both silent" (12/15/2012)  . Nonrheumatic aortic valve stenosis   . Orthostatic hypotension   . Osteoporosis, senile   . Pacemaker   . RBBB   . Restless leg 02/02/2015  . Right leg weakness 12/06/2015  . [facility] spotted fever   . S/P CABG x 4   . Sick sinus syndrome ([place]) 01/31/2014  . Sustained ventricular tachycardia ([place]) 07/27/2014  . Urinary retention 12/30/2014     Past Surgical History:  Procedure Laterality Date  . CARDIAC CATHETERIZATION  04/2010   LIMA to LAD patent,SVG to OM patent,no in-stnet restenosis RCA  . CATARACT EXTRACTION W/ INTRAOCULAR LENS  IMPLANT, BILATERAL Bilateral 2012  . CORONARY ANGIOPLASTY WITH STENT PLACEMENT  07/2009   bare metal stent to SVG to the RCA  . CORONARY ARTERY BYPASS GRAFT  1996   LIMA to LAD,SVG to RCA & SVG to OM  . INSERT / REPLACE / REMOVE PACEMAKER  2010  . MELANOMA EXCISION  05/1974 X2   "taken off my back" (12/15/2012)  . NM MYOVIEW LTD  06/2011   low risk  . PPM GENERATOR CHANGEOUT N/A 01/22/2017   Procedure: PPM GENERATOR CHANGEOUT;  Surgeon: [name], MD;  Location: [place] [facility];  Service: Cardiovascular;  Laterality: N/A;  . TONSILLECTOMY  1938  . TRANSURETHRAL RESECTION OF PROSTATE  1986  . US ECHOCARDIOGRAPHY  07/11/2009   EF 45-50%   Family History  Problem Relation Age of Onset  . Coronary artery disease Mother   . Diabetes Mother   . Heart disease Mother   . Coronary artery disease Father   . Diabetes Father   . Lung cancer Father   . Heart disease Father   . Anuerysm Son   . Heart disease Brother    Social History   Tobacco Use  . Smoking status: Never Smoker  . Smokeless tobacco: Never Used  Substance Use Topics  . Alcohol use: No    Alcohol/week: 0.0 standard drinks  . Drug use: No   Current Outpatient Medications  Medication Sig Dispense Refill  . acetaminophen (TYLENOL) 500 MG  tablet Take 500 mg by mouth 3 (three) times daily.     . Amino Acids-Protein Hydrolys (FEEDING SUPPLEMENT, PRO-STAT SUGAR FREE 64,) LIQD Take 30 mLs by mouth daily.    Marland Kitchen amiodarone (PACERONE) 100 MG tablet Take 1 tablet (100 mg total) by mouth daily. 90 tablet 3  . aspirin EC 81 MG tablet Take 1 tablet (81 mg total) by mouth daily. 30 tablet 1  . Cholecalciferol (VITAMIN D3) 5000 UNITS CAPS Take 5,000 Units by mouth every Monday.     . clopidogrel (PLAVIX) 75 MG tablet TAKE 1 TABLET ONCE DAILY. (Patient taking differently: Take 75 mg by mouth daily. ) 90 tablet 1  .  Droxidopa 100 MG CAPS Take 100 mg by mouth 3 (three) times daily. 90 capsule 6  . guaiFENesin (ROBITUSSIN) 100 MG/5ML SOLN Take 5 mLs (100 mg total) by mouth every 4 (four) hours as needed for cough or to loosen phlegm. 1200 mL 0  . HYDROcodone-acetaminophen (NORCO/VICODIN) 5-325 MG tablet Take 1-2 tablets by mouth every 4 (four) hours as needed.     Marland Kitchen HYDROcodone-acetaminophen (NORCO/VICODIN) 5-325 MG tablet Take 1 tablet by mouth 2 (two) times daily.    Marland Kitchen levothyroxine (SYNTHROID, LEVOTHROID) 100 MCG tablet Take 100 mcg by mouth daily before breakfast.    . Lidocaine 4 % PTCH Apply 1 patch topically daily. Left shoulder    . meclizine (ANTIVERT) 25 MG tablet Take 1 tablet (25 mg total) by mouth 2 (two) times daily as needed for dizziness. 30 tablet 0  . Melatonin 3 MG TABS Take 3 mg by mouth at bedtime.    . methocarbamol (ROBAXIN) 500 MG tablet Take 250 mg by mouth 2 (two) times daily.     . midodrine (PROAMATINE) 10 MG tablet Take 1 tablet (10 mg total) by mouth 3 (three) times daily.    . Multiple Vitamins-Minerals (DECUBI-VITE) CAPS Take 1 capsule by mouth daily.    . NON FORMULARY Take 1 Container by mouth daily.    . NONFORMULARY OR COMPOUNDED ITEM Nizoral 2% cream mixed with Cutivate 0.05% 1:1 ratio: Apply to affected areas of the buttocks 2 times a day and as needed for rash    . pantoprazole (PROTONIX) 40 MG tablet TAKE 1  TABLET BY MOUTH TWICE DAILY. (Patient taking differently: Take 40 mg by mouth 2 (two) times daily. ) 60 tablet 11  . polyethylene glycol (MIRALAX / GLYCOLAX) packet Take 7 capfuls in 32 ounces of water once.  After that you can take 1-2 capfuls in an 8 ounce glass of water daily. 14 each 0  . pravastatin (PRAVACHOL) 40 MG tablet Take 1 tablet (40 mg total) by mouth at bedtime. 90 tablet 3  . rOPINIRole (REQUIP) 0.5 MG tablet TAKE 1 TABLET 3 TIMES A DAY AS NEEDED. (Patient taking differently: Take 0.5 mg by mouth 3 (three) times daily as needed (restless leg syndrome). ) 90 tablet 1  . sennosides-docusate sodium (SENOKOT-S) 8.6-50 MG tablet Take 2 tablets by mouth at bedtime.     . torsemide (DEMADEX) 10 MG tablet Take 1 tablet (10 mg total) by mouth daily.     No current facility-administered medications for this visit.    Allergies  Allergen Reactions  . Altace [Ramipril] Cough  . Crestor [Rosuvastatin Calcium] Rash  . Penicillins Rash and Other (See Comments)    Has patient had a PCN reaction causing immediate rash, facial/tongue/throat swelling, SOB or lightheadedness with hypotension: Yes Has patient had a PCN reaction causing severe rash involving mucus membranes or skin necrosis: No Has patient had a PCN reaction that required hospitalization No Has patient had a PCN reaction occurring within the last 10 years: No If all of the above answers are "NO", then may proceed with Cephalosporin use.   . Sulfa Antibiotics Rash  . Sulfamethoxazole-Trimethoprim Rash     Review of Systems: All systems reviewed and negative except where noted in HPI.     Physical Exam:    Wt Readings from Last 3 Encounters:  01/18/18 176 lb 9.4 oz (80.1 kg)  01/12/18 176 lb 9.6 oz (80.1 kg)  12/10/17 180 lb 14.4 oz (82.1 kg)    See nursing note for vitals  Constitutional:  Pleasant, in no acute distress.  Patient in wheelchair. Psychiatric: Normal mood and affect. Behavior is normal. EENT: Pupils  normal.  Conjunctivae are normal. No scleral icterus. Cardiovascular: Normal rate, regular rhythm. No edema.  SEM Pulmonary/chest: Effort normal and breath sounds normal. No wheezing, rales or rhonchi. Abdominal: Soft, nondistended, nontender. Bowel sounds active throughout. There are no masses palpable. No hepatomegaly. Neurological: Alert and oriented to person place and time. Skin: Skin is warm and dry. No rashes noted.   ASSESSMENT AND PLAN;   JEMUEL LAURSEN is a 84 y.o. male presenting with chronic constipation for the last number of years.  Symptoms responsive to MiraLAX but with watery stools and not reliably able to titrate to regular bowel habits.  Suspect underlying constipation is multifactorial due to limited mobility, polypharmacy including long-term opioid medications.  Will evaluate and treat as below:  - Start Trulance 3 mg daily.  Can stop MiraLAX when starting Trulance. - If Trulance effective, can consider changing to Relistor - Deferred more invasive testing such as colonoscopy, Sitzmarks study, ARM which seems appropriate at this time - Encouraged to ambulate/exercise as much as possible, continue adequate fluid intake - RTC PRN  2) Decreased appetite: Decreased appetite only seems to occur with constipation and wanes after bowel movement.  Will evaluate for clinical improvement with above intervention for constipation.  Weight otherwise stable.  Completed paperwork for care home. Discussed this plan with the patient and his present family members and all agree.  I spent a total of 45 minutes of face-to-face time with the patient. Greater than 50% of the time was spent counseling and coordinating care.    Lavena Bullion, DO, FACG  01/22/2018, 2:11 PM   Wardell Honour, MD

## 2018-01-23 ENCOUNTER — Telehealth: Payer: Self-pay | Admitting: Gastroenterology

## 2018-01-23 NOTE — Telephone Encounter (Signed)
Attempted to reach Adam [name] at [facility]- unable to speak with her at this time; will try again at a later date/time;

## 2018-01-23 NOTE — Telephone Encounter (Signed)
I received the x-ray dated 01/15/2018.  Listed impression as follows: 1) interval multiple air-fluid levels in the previously demonstrated large retrocardiac hiatal hernia, suspicious for Gastric outlet obstruction. 2) interval 80% T12 vertebral compression fracture interval 70% L2 compression fracture with acute kyphosis at the T12 level. 3) mild compressive atelectasis in the left lung.  We discussed his decreased appetite at length in the clinic yesterday.  He and his family seem to think that his appetite improves when he is able to have a bowel movement.  Will evaluate for overall clinical improvement with Trulance which was prescribed yesterday.  If improved constipation but no change in appetite/p.o. tolerance, then can certainly consider further evaluation for his large hiatal hernia.  At the time yesterday, they were certainly reluctant to pursue any invasive procedures such as endoscopy/colonoscopy as well as a surgical referral for a repair of this hernia.  However can always revisit on follow-up appointment.  Thank you for forwarding.

## 2018-01-23 NOTE — Telephone Encounter (Signed)
Please see previous message concerning results Please advise

## 2018-01-23 NOTE — Telephone Encounter (Signed)
Estill Bamberg from [facility] calling stating pt was seen yesterday but Records from [place] were not sent with the patient. Estill Bamberg is faxing scans to Dr.Cirigliano to review and advise if patient needs further gi workup. Estill Bamberg requesting a call back at [phone] to advise on next steps for patient.

## 2018-01-26 NOTE — Telephone Encounter (Signed)
Estill Bamberg from [facility] returned call to the office -Estill Bamberg was given the information on the MD's note, states the patient would be monitored for increase in appetite with following medication regimen, stool habits recorded and reported, understands the family would like to hold off on invasive procedures/tests, the patient can always schedule a follow up appointment if changes in the plan of care are needed/requested; Estill Bamberg verbalized understanding of information/instructions; she was also advised to call back if questions/concerns arise;

## 2018-02-02 ENCOUNTER — Telehealth: Payer: Self-pay

## 2018-02-02 NOTE — Telephone Encounter (Signed)
Left message for patient to remind of missed remote transmission.  

## 2018-02-03 ENCOUNTER — Ambulatory Visit (INDEPENDENT_AMBULATORY_CARE_PROVIDER_SITE_OTHER): Payer: Medicare Other

## 2018-02-03 DIAGNOSIS — I495 Sick sinus syndrome: Secondary | ICD-10-CM | POA: Diagnosis not present

## 2018-02-05 ENCOUNTER — Encounter: Payer: Self-pay | Admitting: Cardiology

## 2018-02-05 NOTE — Progress Notes (Signed)
Remote pacemaker transmission.   

## 2018-02-06 LAB — CUP PACEART REMOTE DEVICE CHECK
Battery Remaining Longevity: 113 mo
Battery Remaining Percentage: 95.5 %
Battery Voltage: 2.98 V
Brady Statistic AP VP Percent: 37 %
Brady Statistic AP VS Percent: 62 %
Brady Statistic AS VP Percent: 1 %
Brady Statistic AS VS Percent: 1.3 %
Brady Statistic RA Percent Paced: 97 %
Brady Statistic RV Percent Paced: 37 %
Date Time Interrogation Session: 20191231204513
Implantable Lead Implant Date: 20100309
Implantable Lead Implant Date: 20100309
Implantable Lead Location: 753859
Implantable Lead Location: 753860
Implantable Pulse Generator Implant Date: 20181219
Lead Channel Impedance Value: 400 Ohm
Lead Channel Impedance Value: 530 Ohm
Lead Channel Pacing Threshold Amplitude: 0.625 V
Lead Channel Pacing Threshold Amplitude: 0.75 V
Lead Channel Pacing Threshold Pulse Width: 0.4 ms
Lead Channel Pacing Threshold Pulse Width: 0.4 ms
Lead Channel Sensing Intrinsic Amplitude: 12 mV
Lead Channel Sensing Intrinsic Amplitude: 2.8 mV
Lead Channel Setting Pacing Amplitude: 0.875
Lead Channel Setting Pacing Amplitude: 2.5 V
Lead Channel Setting Pacing Pulse Width: 0.4 ms
Lead Channel Setting Sensing Sensitivity: 2 mV
Pulse Gen Model: 2272
Pulse Gen Serial Number: 8965776

## 2018-02-09 ENCOUNTER — Encounter: Payer: Medicare Other | Admitting: Nurse Practitioner

## 2018-02-09 NOTE — Progress Notes (Signed)
Cardiology Office Note   Date:  02/09/2018   ID:  Phoenyx, Paulsen [birth year], MRN [id number]  PCP:  Wardell Honour, MD  Cardiologist: Croitoru  No chief complaint on file.    History of Present Illness: Adam [name] is a 85 y.o. male who presents for ongoing assessment and management of "silent myocardial infarction X 2", CAD with BMS to SVG-RCA, prior sustained VT, chronic combined systolic and diastolic CHF, hx of ischemic CVA, PPM in situ (St. Jude), severe disabling orthostatic hypotension.   He is well cared for by his daughter were very attentive to his needs and aware of any change in his status or any new symptoms. He was last seen in the office on 01/15/2018 with chest pressure, abdominal bloating, and edema.   His torsemide was increased to 10 mg daily from 5 mg daily, and detailed instructions were given to [facility], to weigh him daily and to measure his urine output. If symptoms worsened he was to go to the hospital.   He was seen in the ER with complaints of back pain.CT scan of the abdomen revealed a T-11 with subacute compression fracture of the L-3. A kyphoplasty is planned. He was given the okay by Dr. Sallyanne Kuster to proceed on 01/19/2018. He was seen by GI, Dr. Bryan Lemma on 01/23/2018 and found to have constipation. He was started on Trulance He was also found to have a large hiatal hernia. The family was reluctant to pursue any invasive procedures, or surgical repair of the hernia. Marland Kitchen    He comes today improved concerning his volume overload, but he continues to have some DOE. He is very deconditioned and uses wheelchair for ambulation. He is to see orthopedic surgeon this month for evaluation for surgical intervention.   He states that his constipation is better, but his appetite is not. His wife disagrees stating that he is eating better than he has in the past. He is also on nutrition supplements.   Past Medical History:  Diagnosis Date  . Arthritis    "minor,  back and sometimes knees" (12/15/2012)  . Bradycardia    AFib/SSS s/p St Jude PPM 04/12/2008  . CAD (coronary artery disease) 12/30/2014   CABG (LIMA-LAD, SVG-RCA, SVG-OM in 1996).  07/2009 BMS to SVG-RCA. Cath in 04/2010 with patent stents   . Cardiomyopathy, ischemic 08/25/2012  . CHF (congestive heart failure) ([place])   . Chronic knee pain 12/03/2014  . Combined congestive systolic and diastolic heart failure ([place]) 02/02/2015   Hx EF 41%. BNP 96.8 02/21/15 Torsemide 04/06/15 Na 142, K 4.6, Bun 16, creat 0.89 04/20/14 BNP 111.7, Na 142, K 4.6, Bun 16, creat 0.9   . Depression with anxiety 02/02/2015   02/21/15 Hgb A1c 5.8 03/10/15 MMSE 30/30   . Dizziness, after diuretic asscoiated with hypotension and responded to fluid bolus 06/05/2011   04/28/15 US carotid R+L normal bilateral arterial velocities.    Marland Kitchen Dyspnea 08/11/2014   Followed in Pulmonary clinic/ [facility]/ [place]  - 08/11/2014  Walked RA x 1 laps @ 185 ft each stopped due to fatigue/off balance/ slow pace/  no sob or desat  - PFT's  09/26/2014  FEV1 2.26 (85 % ) ratio 76  p no % improvement from saba with DLCO  67 % corrects to 93 % for alv volume      Since prev study 08/04/13 minimal change lung vol or dlco    . Embolic cerebral infarction ([place]) 12/06/2015  . Exertional shortness of  breath    "sometimes walking" (12/15/2012)  . GERD (gastroesophageal reflux disease)   . Gout 02/09/2015  . Heart murmur    "just told I had one today" (12/15/2012)  . Hiatal hernia   . Hyperlipidemia   . Hypertension   . Hypothyroid   . Influenza A 03/10/2016  . Insomnia   . Melanoma of back ([place]) 1976  . Myocardial infarction [facility]) 1996; 2011   "both silent" (12/15/2012)  . Nonrheumatic aortic valve stenosis   . Orthostatic hypotension   . Osteoporosis, senile   . Pacemaker   . RBBB   . Restless leg 02/02/2015  . Right leg weakness 12/06/2015  . [facility] spotted fever   . S/P CABG x 4   . Sick sinus syndrome ([place]) 01/31/2014  . Sustained  ventricular tachycardia ([place]) 07/27/2014  . Urinary retention 12/30/2014   has a cathrater    Past Surgical History:  Procedure Laterality Date  . CARDIAC CATHETERIZATION  04/2010   LIMA to LAD patent,SVG to OM patent,no in-stnet restenosis RCA  . CATARACT EXTRACTION W/ INTRAOCULAR LENS  IMPLANT, BILATERAL Bilateral 2012  . CORONARY ANGIOPLASTY WITH STENT PLACEMENT  07/2009   bare metal stent to SVG to the RCA  . CORONARY ARTERY BYPASS GRAFT  1996   LIMA to LAD,SVG to RCA & SVG to OM  . INSERT / REPLACE / REMOVE PACEMAKER  2010  . MELANOMA EXCISION  05/1974 X2   "taken off my back" (12/15/2012)  . NM MYOVIEW LTD  06/2011   low risk  . PPM GENERATOR CHANGEOUT N/A 01/22/2017   Procedure: PPM GENERATOR CHANGEOUT;  Surgeon: [name], MD;  Location: [place] [facility];  Service: Cardiovascular;  Laterality: N/A;  . TONSILLECTOMY  1938  . TRANSURETHRAL RESECTION OF PROSTATE  1986  . US ECHOCARDIOGRAPHY  07/11/2009   EF 45-50%     Current Outpatient Medications  Medication Sig Dispense Refill  . acetaminophen (TYLENOL) 500 MG tablet Take 500 mg by mouth 3 (three) times daily.     Marland Kitchen ALPRAZolam (XANAX) 0.25 MG tablet Take 0.25 mg by mouth 2 (two) times daily as needed for anxiety.    . Amino Acids-Protein Hydrolys (FEEDING SUPPLEMENT, PRO-STAT SUGAR FREE 64,) LIQD Take 30 mLs by mouth daily.    Marland Kitchen amiodarone (PACERONE) 100 MG tablet Take 1 tablet (100 mg total) by mouth daily. 90 tablet 3  . aspirin EC 81 MG tablet Take 1 tablet (81 mg total) by mouth daily. 30 tablet 1  . Cholecalciferol (VITAMIN D3) 5000 UNITS CAPS Take 5,000 Units by mouth every Monday.     . clopidogrel (PLAVIX) 75 MG tablet TAKE 1 TABLET ONCE DAILY. (Patient taking differently: Take 75 mg by mouth daily. ) 90 tablet 1  . Droxidopa 100 MG CAPS Take 100 mg by mouth 3 (three) times daily. 90 capsule 6  . guaiFENesin (ROBITUSSIN) 100 MG/5ML SOLN Take 5 mLs (100 mg total) by mouth every 4 (four) hours as needed for cough  or to loosen phlegm. 1200 mL 0  . HYDROcodone-acetaminophen (NORCO/VICODIN) 5-325 MG tablet Take 1-2 tablets by mouth every 4 (four) hours as needed.     Marland Kitchen HYDROcodone-acetaminophen (NORCO/VICODIN) 5-325 MG tablet Take 1 tablet by mouth 2 (two) times daily.    Marland Kitchen levothyroxine (SYNTHROID, LEVOTHROID) 100 MCG tablet Take 100 mcg by mouth daily before breakfast.    . Lidocaine 4 % PTCH Apply 1 patch topically daily. Left shoulder    . meclizine (ANTIVERT) 25 MG tablet  Take 1 tablet (25 mg total) by mouth 2 (two) times daily as needed for dizziness. 30 tablet 0  . Melatonin 3 MG TABS Take 3 mg by mouth at bedtime.    . methocarbamol (ROBAXIN) 500 MG tablet Take 250 mg by mouth 2 (two) times daily.     . midodrine (PROAMATINE) 10 MG tablet Take 1 tablet (10 mg total) by mouth 3 (three) times daily.    . Multiple Vitamins-Minerals (DECUBI-VITE) CAPS Take 1 capsule by mouth daily.    . NON FORMULARY Take 1 Container by mouth daily.    . NONFORMULARY OR COMPOUNDED ITEM Nizoral 2% cream mixed with Cutivate 0.05% 1:1 ratio: Apply to affected areas of the buttocks 2 times a day and as needed for rash    . pantoprazole (PROTONIX) 40 MG tablet TAKE 1 TABLET BY MOUTH TWICE DAILY. (Patient taking differently: Take 40 mg by mouth 2 (two) times daily. ) 60 tablet 11  . Plecanatide (TRULANCE) 3 MG TABS Take 3 mg by mouth daily. 30 tablet 11  . polyethylene glycol (MIRALAX / GLYCOLAX) packet Take 7 capfuls in 32 ounces of water once.  After that you can take 1-2 capfuls in an 8 ounce glass of water daily. 14 each 0  . pravastatin (PRAVACHOL) 40 MG tablet Take 1 tablet (40 mg total) by mouth at bedtime. 90 tablet 3  . rOPINIRole (REQUIP) 0.5 MG tablet TAKE 1 TABLET 3 TIMES A DAY AS NEEDED. (Patient taking differently: Take 0.5 mg by mouth 3 (three) times daily as needed (restless leg syndrome). ) 90 tablet 1  . sennosides-docusate sodium (SENOKOT-S) 8.6-50 MG tablet Take 2 tablets by mouth at bedtime.     . torsemide  (DEMADEX) 10 MG tablet Take 1 tablet (10 mg total) by mouth daily.     No current facility-administered medications for this visit.     Allergies:   Altace [ramipril]; Crestor [rosuvastatin calcium]; Penicillins; Sulfa antibiotics; and Sulfamethoxazole-trimethoprim    Social History:  The patient  reports that he has never smoked. He has never used smokeless tobacco. He reports that he does not drink alcohol or use drugs.   Family History:  The patient's family history includes Anuerysm in his son; Coronary artery disease in his father and mother; Diabetes in his father and mother; Heart disease in his brother, father, and mother; Lung cancer in his father.    ROS: All other systems are reviewed and negative. Unless otherwise mentioned in H&P    PHYSICAL EXAM: VS:  There were no vitals taken for this visit. , BMI There is no height or weight on file to calculate BMI. GEN: Well nourished, well developed, in no acute distress, frail, sitting in a wheelchair. HEENT: normal Neck: no JVD, carotid bruits, or masses Cardiac: RRR harsh 3/6 systolic murmur with preserved S2. No rubs, or gallops,no edema  Respiratory:  Clear to auscultation bilaterally, normal work of breathing GI: soft, nontender, nondistended, + BS MS: no deformity or atrophy, some kyphosis is noted.  Skin: warm and dry, no rash Neuro:  Strength and sensation are intact, some memory issues.  Psych: euthymic mood, full affect   EKG:  Not completed this office visit.   Recent Labs: 08/26/2017: TSH 1.125 08/27/2017: Magnesium 2.1 01/18/2018: ALT 13; B Natriuretic Peptide 151.6; Hemoglobin 11.6; Platelets 260 01/19/2018: BUN 16; Creatinine 0.7; Potassium 4.6; Sodium 139    Lipid Panel    Component Value Date/Time   CHOL 103 05/05/2017 0000   TRIG 129 05/05/2017 0000  HDL 39 (L) 05/05/2017 0000   CHOLHDL 2.6 05/05/2017 0000   VLDL 20 12/07/2015 0449   LDLCALC 43 05/05/2017 0000      Wt Readings from Last 3  Encounters:  01/22/18 172 lb (78 kg)  01/18/18 176 lb 9.4 oz (80.1 kg)  01/12/18 176 lb 9.6 oz (80.1 kg)      Other studies Reviewed: Echocardiogram 09/21/2017  Left ventricle: The cavity size was normal. Wall thickness was   increased in a pattern of severe LVH. Systolic function was   moderately reduced. The estimated ejection fraction was in the   range of 35% to 40%. Akinesis of the basalinferior myocardium.   Doppler parameters are consistent with a reversible restrictive   pattern, indicative of decreased left ventricular diastolic   compliance and/or increased left atrial pressure (grade 3   diastolic dysfunction). - Aortic valve: There was moderate stenosis. There was mild   regurgitation. Valve area (VTI): 1.17 cm^2. Valve area (Vmax):   1.22 cm^2. Valve area (Vmean): 1.34 cm^2.   ASSESSMENT AND PLAN:  1. CAD: BMS to the SVG to RCA in 2011, with LIMA to LAD, SVG to RCA, SVG to OM in 1996. He denies recurrent chest pain. He has chronic DOE but has been inactive. No planned ischemic testing at this time due multiple co-morbidities.   2. Chronic Combined Ischemic CM with CHF: He is tolerating torsemide 10 mg daily (increased from 5 mg daily).  No evidence of fluid overload. Check a BMET. I checked O2 sat in the office found it to be 97%. He may need to have walking O2 sat for need for O2 supplement when is he able.   3. AoV stenosis: Moderate per echo in 08/2017. May be contributing to dyspnea as well. Doubt candidate for repair if progressed.   4. Compression fracture: T-11 and L-3. He is to see orthopedic surgery for recommendations on possible kyphoplasty. If this is required he is okay to hold Plavix for 5 -7 days prior to intervention if necessary.   5. Pacemaker in situ-St, Jude PPM 04/12/2008. Due for PPM interrogation in April of 2020.    Current medicines are reviewed at length with the patient today.    Labs/ tests ordered today include: BMET.   Phill Myron. West Pugh, ANP, AACC   02/09/2018 1:09 PM    [facility] [place] [address] Office [phone] Fax [phone]

## 2018-02-10 ENCOUNTER — Encounter: Payer: Self-pay | Admitting: Adult Health

## 2018-02-10 ENCOUNTER — Ambulatory Visit: Payer: Medicare Other | Admitting: Adult Health

## 2018-02-10 VITALS — BP 112/62 | HR 69 | Ht 70.0 in | Wt 174.6 lb

## 2018-02-10 DIAGNOSIS — I2510 Atherosclerotic heart disease of native coronary artery without angina pectoris: Secondary | ICD-10-CM | POA: Diagnosis not present

## 2018-02-10 DIAGNOSIS — I5042 Chronic combined systolic (congestive) and diastolic (congestive) heart failure: Secondary | ICD-10-CM | POA: Diagnosis not present

## 2018-02-10 DIAGNOSIS — I43 Cardiomyopathy in diseases classified elsewhere: Secondary | ICD-10-CM | POA: Diagnosis not present

## 2018-02-10 DIAGNOSIS — I350 Nonrheumatic aortic (valve) stenosis: Secondary | ICD-10-CM

## 2018-02-10 DIAGNOSIS — R5381 Other malaise: Secondary | ICD-10-CM

## 2018-02-10 NOTE — Progress Notes (Signed)
Thanks, Adam [name]. He is indeed fragile, but has shown resiliency as well. I think he would tolerate kyphoplasty. I would recommend against surgical hernia repair at this time. MCr

## 2018-02-10 NOTE — Patient Instructions (Signed)
Medication Instructions:  NO CHANGES- Your physician recommends that you continue on your current medications as directed. Please refer to the Current Medication list given to you today. If you need a refill on your cardiac medications before your next appointment, please call your pharmacy.  Labwork: BMET TODAY HERE IN OUR OFFICE AT [facility]  Take the provided lab slips with you to the lab for your blood draw.  When you have your labs (blood work) drawn today and your tests are completely normal, you will receive your results only by MyChart Message (if you have MyChart) -OR-  A paper copy in the mail.  If you have any lab test that is abnormal or we need to change your treatment, we will call you to review these results.  Special Instructions: SHOULD YOU HAVE THE KYPHOPLASTY IN THE FUTURE, YOU MAY STOP PLAVIX 5 DAYS PRIOR AND RESTART WHEN SAFE TO DO SO   Follow-Up: You will need a follow up appointment in 6 weeks.  Please call our office 2 months in advance to schedule this appointment.  You may see [name], MD-ONLY or one of the following Advanced Practice Providers on your designated Care Team:   Almyra Deforest, [place]  Fabian Sharp, PA-C  At [facility], you and your health needs are our priority.  As part of our continuing mission to provide you with exceptional heart care, we have created designated Provider Care Teams.  These Care Teams include your primary Cardiologist (physician) and Advanced Practice Providers (APPs -  Physician Assistants and Nurse Practitioners) who all work together to provide you with the care you need, when you need it.  Thank you for choosing [facility] at [facility]!!

## 2018-02-13 ENCOUNTER — Non-Acute Institutional Stay (SKILLED_NURSING_FACILITY): Payer: Medicare Other | Admitting: Nurse Practitioner

## 2018-02-13 ENCOUNTER — Encounter: Payer: Self-pay | Admitting: Nurse Practitioner

## 2018-02-13 DIAGNOSIS — S32039A Unspecified fracture of third lumbar vertebra, initial encounter for closed fracture: Secondary | ICD-10-CM | POA: Insufficient documentation

## 2018-02-13 DIAGNOSIS — N281 Cyst of kidney, acquired: Secondary | ICD-10-CM

## 2018-02-13 DIAGNOSIS — I472 Ventricular tachycardia, unspecified: Secondary | ICD-10-CM

## 2018-02-13 DIAGNOSIS — G2581 Restless legs syndrome: Secondary | ICD-10-CM

## 2018-02-13 DIAGNOSIS — S22088D Other fracture of T11-T12 vertebra, subsequent encounter for fracture with routine healing: Secondary | ICD-10-CM

## 2018-02-13 DIAGNOSIS — K5909 Other constipation: Secondary | ICD-10-CM

## 2018-02-13 DIAGNOSIS — I951 Orthostatic hypotension: Secondary | ICD-10-CM

## 2018-02-13 DIAGNOSIS — S22089A Unspecified fracture of T11-T12 vertebra, initial encounter for closed fracture: Secondary | ICD-10-CM | POA: Insufficient documentation

## 2018-02-13 DIAGNOSIS — S32038D Other fracture of third lumbar vertebra, subsequent encounter for fracture with routine healing: Secondary | ICD-10-CM | POA: Diagnosis not present

## 2018-02-13 DIAGNOSIS — F418 Other specified anxiety disorders: Secondary | ICD-10-CM

## 2018-02-13 DIAGNOSIS — K219 Gastro-esophageal reflux disease without esophagitis: Secondary | ICD-10-CM

## 2018-02-13 MED ORDER — HYDROCODONE-ACETAMINOPHEN 5-325 MG PO TABS: 1.0000 | ORAL_TABLET | ORAL | 0 refills | Status: DC | PRN

## 2018-02-13 MED ORDER — ALPRAZOLAM 0.25 MG PO TABS: 0.2500 mg | ORAL_TABLET | Freq: Two times a day (BID) | ORAL | 0 refills | Status: DC | PRN

## 2018-02-13 NOTE — Assessment & Plan Note (Signed)
Hx of restless leg, continue prn Requip 0.5mg  tid.

## 2018-02-13 NOTE — Assessment & Plan Note (Signed)
01/18/09 CT showed acute compression frx T11 50% height loss, 43mm , acute L3 compression fracture 50% height loss.  Left L2-3 neural foraminal stenosis. Pend surgical repair, Plavix should be held 5 days prior to the surgery per wife. Continue Norco 5/325mg  bid, Tylenol 500mg  tid, Robaxin 250mg  bid.

## 2018-02-13 NOTE — Assessment & Plan Note (Signed)
VT, pace maker, continue  Amiodarone 100mg  qd.

## 2018-02-13 NOTE — Assessment & Plan Note (Signed)
Orthostatic hypotension, stable, continue  Midodrine 10mg  tid, Droxidopa 100mg  tid.

## 2018-02-13 NOTE — Assessment & Plan Note (Signed)
conitnue Prn Alprazolam 0.25mg  bid for anxiety.

## 2018-02-13 NOTE — Assessment & Plan Note (Signed)
GERD stable, continue  pantoprazole 40mg qd.  

## 2018-02-13 NOTE — Assessment & Plan Note (Signed)
01/18/09 CT showed acute compression frx T11 50% height loss, 28mm , acute L3 compression fracture 50% height loss.  Left L2-3 neural foraminal stenosis. Pend surgical repair, Plavix should be held 5 days prior to the surgery per wife. Continue Tylenol 500mg  tid, Norco 5/325mg  bid for pain control.

## 2018-02-13 NOTE — Assessment & Plan Note (Signed)
Incidental finding of bilateral renal cysts on CT 01/18/18, f/u urology 02/27/18.

## 2018-02-13 NOTE — Progress Notes (Addendum)
Location:  [facility] Room Number: 29 Place of Service:  SNF (31) Provider:  Marlana Latus  NP  [name], MD  Patient Care Team: [name], MD as PCP - General (Internal Medicine) [name], MD as PCP - Cardiology (Cardiology) [name], MD as Attending Physician (Cardiology) Vevelyn Royals, MD as Consulting Physician (Ophthalmology) Franchot Gallo, MD as Consulting Physician (Urology) Tanda Rockers, MD as Consulting Physician (Pulmonary Disease) Allyn Kenner, MD (Dermatology) Deliah Goody, PA-C as Physician Assistant (Physician Assistant) Sherley Mckenney X, NP as Nurse Practitioner (Internal Medicine)  Extended Emergency Contact Information Primary Emergency Contact: Yore,[name] Address: [place], [place] Phone: [phone] Mobile Phone: [phone] Relation: Spouse Secondary Emergency Contact: [name],[place]  [place] Mobile Phone: [phone] Relation: Daughter  Code Status:  Full Code Goals of care: Advanced Directive information Advanced Directives 02/13/2018  Does Patient Have a Medical Advance Directive? Yes  Type of Paramedic of [place];Living will  Does patient want to make changes to medical advance directive? No - Patient declined  Copy of [place] in Chart? Yes - validated most recent copy scanned in chart (See row information)  Would patient like information on creating a medical advance directive? -  Pre-existing out of facility DNR order (yellow form or pink MOST form) -     Chief Complaint  Patient presents with  . Medical Management of Chronic Issues    HPI:  Pt is a 85 y.o. male seen today for medical management of chronic diseases.     The patient resides in SNF [facility] for safety and care assistance. 01/18/09 CT showed acute compression frx T11 50% height loss, 15mm , acute L3 compression  fracture 50% height loss.  Left L2-3 neural foraminal stenosis. Pend surgical repair, Plavix should be held 5 days prior to the surgery per wife. Incidental finding of bilateral renal cysts on CT, f/u urology 02/27/18. Pain is managed with Norco 5/325 bid, Tylenol 500mg  tid, Robaxin 250mg  bid. Hx of restless leg, prn Requip 0.5mg  tid. Constipation is stable, last BM yesterday, on Senokot S II qd, MiraLax qd, Trulance 3mg  qd. GERD stable, on pantoprazole 40mg  qd. Orthostatic hypotension, stable, on Midodrine 10mg  tid, Droxidopa 100mg  tid. VT, pace maker, on Amiodarone 100mg  qd. Prn Alprazolam 0.25mg  bid for anxiety.   Past Medical History:  Diagnosis Date  . Arthritis    "minor, back and sometimes knees" (12/15/2012)  . Bradycardia    AFib/SSS s/p St Jude PPM 04/12/2008  . CAD (coronary artery disease) 12/30/2014   CABG (LIMA-LAD, SVG-RCA, SVG-OM in 1996).  07/2009 BMS to SVG-RCA. Cath in 04/2010 with patent stents   . Cardiomyopathy, ischemic 08/25/2012  . CHF (congestive heart failure) ([place])   . Chronic knee pain 12/03/2014  . Combined congestive systolic and diastolic heart failure ([place]) 02/02/2015   Hx EF 41%. BNP 96.8 02/21/15 Torsemide 04/06/15 Na 142, K 4.6, Bun 16, creat 0.89 04/20/14 BNP 111.7, Na 142, K 4.6, Bun 16, creat 0.9   . Depression with anxiety 02/02/2015   02/21/15 Hgb A1c 5.8 03/10/15 MMSE 30/30   . Dizziness, after diuretic asscoiated with hypotension and responded to fluid bolus 06/05/2011   04/28/15 US carotid R+L normal bilateral arterial velocities.    Marland Kitchen Dyspnea 08/11/2014   Followed in Pulmonary clinic/ [facility]/ [place]  - 08/11/2014  Walked RA x 1 laps @  185 ft each stopped due to fatigue/off balance/ slow pace/  no sob or desat  - PFT's  09/26/2014  FEV1 2.26 (85 % ) ratio 76  p no % improvement from saba with DLCO  67 % corrects to 93 % for alv volume      Since prev study 08/04/13 minimal change lung vol or dlco    . Embolic cerebral infarction ([place]) 12/06/2015  . Exertional  shortness of breath    "sometimes walking" (12/15/2012)  . GERD (gastroesophageal reflux disease)   . Gout 02/09/2015  . Heart murmur    "just told I had one today" (12/15/2012)  . Hiatal hernia   . Hyperlipidemia   . Hypertension   . Hypothyroid   . Influenza A 03/10/2016  . Insomnia   . Melanoma of back ([place]) 1976  . Myocardial infarction [facility]) 1996; 2011   "both silent" (12/15/2012)  . Nonrheumatic aortic valve stenosis   . Orthostatic hypotension   . Osteoporosis, senile   . Pacemaker   . RBBB   . Restless leg 02/02/2015  . Right leg weakness 12/06/2015  . [facility] spotted fever   . S/P CABG x 4   . Sick sinus syndrome ([place]) 01/31/2014  . Sustained ventricular tachycardia ([place]) 07/27/2014  . Urinary retention 12/30/2014   has a cathrater   Past Surgical History:  Procedure Laterality Date  . CARDIAC CATHETERIZATION  04/2010   LIMA to LAD patent,SVG to OM patent,no in-stnet restenosis RCA  . CATARACT EXTRACTION W/ INTRAOCULAR LENS  IMPLANT, BILATERAL Bilateral 2012  . CORONARY ANGIOPLASTY WITH STENT PLACEMENT  07/2009   bare metal stent to SVG to the RCA  . CORONARY ARTERY BYPASS GRAFT  1996   LIMA to LAD,SVG to RCA & SVG to OM  . INSERT / REPLACE / REMOVE PACEMAKER  2010  . MELANOMA EXCISION  05/1974 X2   "taken off my back" (12/15/2012)  . NM MYOVIEW LTD  06/2011   low risk  . PPM GENERATOR CHANGEOUT N/A 01/22/2017   Procedure: PPM GENERATOR CHANGEOUT;  Surgeon: [name], MD;  Location: [facility];  Service: Cardiovascular;  Laterality: N/A;  . TONSILLECTOMY  1938  . TRANSURETHRAL RESECTION OF PROSTATE  1986  . US ECHOCARDIOGRAPHY  07/11/2009   EF 45-50%    Allergies  Allergen Reactions  . Altace [Ramipril] Cough  . Crestor [Rosuvastatin Calcium] Rash  . Penicillins Rash and Other (See Comments)    Has patient had a PCN reaction causing immediate rash, facial/tongue/throat swelling, SOB or lightheadedness with hypotension: Yes Has patient had a  PCN reaction causing severe rash involving mucus membranes or skin necrosis: No Has patient had a PCN reaction that required hospitalization No Has patient had a PCN reaction occurring within the last 10 years: No If all of the above answers are "NO", then may proceed with Cephalosporin use.   . Sulfa Antibiotics Rash  . Sulfamethoxazole-Trimethoprim Rash    Outpatient Encounter Medications as of 02/13/2018  Medication Sig  . acetaminophen (TYLENOL) 500 MG tablet Take 500 mg by mouth 3 (three) times daily.   Marland Kitchen ALPRAZolam (XANAX) 0.25 MG tablet Take 1 tablet (0.25 mg total) by mouth 2 (two) times daily as needed for anxiety.  . Amino Acids-Protein Hydrolys (FEEDING SUPPLEMENT, PRO-STAT SUGAR FREE 64,) LIQD Take 30 mLs by mouth daily.  Marland Kitchen amiodarone (PACERONE) 100 MG tablet Take 1 tablet (100 mg total) by mouth daily.  Marland Kitchen aspirin EC 81 MG tablet Take 1 tablet (81  mg total) by mouth daily.  . calcitonin, salmon, (MIACALCIN/FORTICAL) 200 UNIT/ACT nasal spray 1 spray every other day. To right nostril  . Cholecalciferol (VITAMIN D3) 5000 UNITS CAPS Take 5,000 Units by mouth every Monday.   . clopidogrel (PLAVIX) 75 MG tablet TAKE 1 TABLET ONCE DAILY. (Patient taking differently: TAKE 1 TABLET ONCE DAILY.)  . Droxidopa 100 MG CAPS Take 100 mg by mouth 3 (three) times daily.  Marland Kitchen guaiFENesin (ROBITUSSIN) 100 MG/5ML SOLN Take 5 mLs (100 mg total) by mouth every 4 (four) hours as needed for cough or to loosen phlegm.  Marland Kitchen HYDROcodone-acetaminophen (NORCO/VICODIN) 5-325 MG tablet Take 1-2 tablets by mouth every 4 (four) hours as needed for moderate pain or severe pain. For moderate pain- Total daily acetaminophen dose should not exceed 3000 mg  . levothyroxine (SYNTHROID, LEVOTHROID) 100 MCG tablet Take 100 mcg by mouth daily before breakfast.  . Lidocaine 4 % PTCH Apply 1 patch topically daily. Left shoulder  . meclizine (ANTIVERT) 25 MG tablet Take 1 tablet (25 mg total) by mouth 2 (two) times daily as  needed for dizziness.  . Melatonin 3 MG TABS Take 3 mg by mouth at bedtime.  . methocarbamol (ROBAXIN) 500 MG tablet Take 250 mg by mouth 2 (two) times daily.   . midodrine (PROAMATINE) 10 MG tablet Take 1 tablet (10 mg total) by mouth 3 (three) times daily.  . Multiple Vitamins-Minerals (DECUBI-VITE) CAPS Take 1 capsule by mouth daily.  . NON FORMULARY Take 1 Container by mouth daily.  . pantoprazole (PROTONIX) 40 MG tablet TAKE 1 TABLET BY MOUTH TWICE DAILY. (Patient taking differently: Take 40 mg by mouth 2 (two) times daily. )  . Plecanatide (TRULANCE) 3 MG TABS Take 3 mg by mouth daily.  . polyethylene glycol (MIRALAX / GLYCOLAX) packet Take 7 capfuls in 32 ounces of water once.  After that you can take 1-2 capfuls in an 8 ounce glass of water daily.  . pravastatin (PRAVACHOL) 40 MG tablet Take 1 tablet (40 mg total) by mouth at bedtime.  Marland Kitchen rOPINIRole (REQUIP) 0.5 MG tablet TAKE 1 TABLET 3 TIMES A DAY AS NEEDED. (Patient taking differently: Take 0.5 mg by mouth 3 (three) times daily as needed (restless leg syndrome). )  . sennosides-docusate sodium (SENOKOT-S) 8.6-50 MG tablet Take 2 tablets by mouth daily.   Marland Kitchen torsemide (DEMADEX) 10 MG tablet Take 1 tablet (10 mg total) by mouth daily.  . [DISCONTINUED] ALPRAZolam (XANAX) 0.25 MG tablet Take 0.25 mg by mouth 2 (two) times daily as needed for anxiety.  . [DISCONTINUED] HYDROcodone-acetaminophen (NORCO/VICODIN) 5-325 MG tablet Take 1-2 tablets by mouth every 4 (four) hours as needed for moderate pain or severe pain. For moderate pain- Total daily acetaminophen dose should not exceed 3000 mg  . [DISCONTINUED] HYDROcodone-acetaminophen (NORCO/VICODIN) 5-325 MG tablet Take 1 tablet by mouth 2 (two) times daily.   . [DISCONTINUED] NONFORMULARY OR COMPOUNDED ITEM Nizoral 2% cream mixed with Cutivate 0.05% 1:1 ratio: Apply to affected areas of the buttocks 2 times a day and as needed for rash   No facility-administered encounter medications on file  as of 02/13/2018.    ROS as provided with assistance of wife and staff.  Review of Systems  Constitutional: Negative for activity change, appetite change, chills, diaphoresis, fatigue, fever and unexpected weight change.  HENT: Positive for hearing loss. Negative for congestion and voice change.   Respiratory: Negative for cough, shortness of breath and wheezing.   Cardiovascular: Positive for leg swelling. Negative for chest  pain and palpitations.  Gastrointestinal: Positive for abdominal pain. Negative for abdominal distention, constipation, diarrhea, nausea and vomiting.       On and off abd pain, no pain today.   Genitourinary: Negative for difficulty urinating, dysuria and urgency.  Musculoskeletal: Positive for arthralgias, back pain and gait problem.       Multiple sites, upper back/neck/shoulder area aches/pains-Lidoderm patch.   Skin: Negative for pallor and rash.       The right  lower leg bruise.   Neurological: Negative for dizziness, speech difficulty, weakness and headaches.       Memory lapses.   Psychiatric/Behavioral: Negative for agitation, behavioral problems, hallucinations and sleep disturbance. The patient is not nervous/anxious.     Immunization History  Administered Date(s) Administered  . Influenza, High Dose Seasonal PF 11/01/2014, 11/13/2016  . Influenza,inj,Quad PF,6+ Mos 11/24/2017  . Influenza-Unspecified 12/05/2013, 11/05/2014, 11/16/2015  . PPD Test 03/07/2014  . Pneumococcal Conjugate-13 10/05/2009  . Pneumococcal Polysaccharide-23 11/05/2005  . Pneumococcal-Unspecified 10/05/2009  . Tdap 08/02/2014  . Zoster 11/05/2005   Pertinent  Health Maintenance Due  Topic Date Due  . PNA vac Low Risk Adult (2 of 2 - PCV13) 10/06/2010  . DEXA SCAN  12/14/2024  . INFLUENZA VACCINE  Completed   Fall Risk  10/09/2017 10/02/2016 10/30/2015 09/05/2015 02/02/2015  Falls in the past year? No No No No No  Risk for fall due to : - - Impaired balance/gait - -    Functional Status Survey:    Vitals:   02/13/18 1229  BP: (!) 146/80  Pulse: 70  Resp: 18  Temp: 98.9 F (37.2 C)  SpO2: 96%  Weight: 170 lb 14.4 oz (77.5 kg)  Height: 5\' 10"  (1.778 m)   Body mass index is 24.52 kg/m. Physical Exam Constitutional:      General: He is not in acute distress.    Appearance: Normal appearance. He is normal weight. He is not ill-appearing, toxic-appearing or diaphoretic.  HENT:     Head: Normocephalic and atraumatic.     Nose: Nose normal. No congestion.     Mouth/Throat:     Mouth: Mucous membranes are moist.  Eyes:     Extraocular Movements: Extraocular movements intact.     Pupils: Pupils are equal, round, and reactive to light.  Cardiovascular:     Rate and Rhythm: Normal rate and regular rhythm.     Heart sounds: No murmur.     Comments: Pacemaker.  Pulmonary:     Effort: Pulmonary effort is normal.     Breath sounds: Normal breath sounds. No wheezing, rhonchi or rales.  Abdominal:     General: There is no distension.     Palpations: Abdomen is soft.     Tenderness: There is no abdominal tenderness. There is no guarding or rebound.  Musculoskeletal:        General: Tenderness present.     Right lower leg: Edema present.     Left lower leg: Edema present.     Comments: Trace edema. Needs assistance for transfer. W/c for mobility. Neck aches. Mid-lower back pain and around in the girth.   Skin:    General: Skin is warm and dry.     Findings: Bruising and erythema present.     Comments: Facial seborrheic dermatitis-wife apply cream from dermatologist daily. The right  lower leg bruise. Hold patch to neck due to skin irritation.   Neurological:     General: No focal deficit present.     Mental  Status: He is alert. Mental status is at baseline.     Cranial Nerves: No cranial nerve deficit.     Motor: No weakness.     Coordination: Coordination normal.     Gait: Gait abnormal.     Comments: Oriented to person and place.    Psychiatric:        Mood and Affect: Mood normal.        Behavior: Behavior normal.     Labs reviewed: Recent Labs    08/26/17 0426 08/27/17 0423  11/25/17 0024  01/15/18 1207 01/18/18 1657 01/19/18  NA 139 142   < > 138   < > 139 139 139  K 4.2 3.8   < > 4.1   < > 4.1 3.8 4.6  CL 107 108   < > 104  --  101 104  --   CO2 25 26   < > 26  --  24 26  --   GLUCOSE 104* 109*   < > 112*  --  100* 127*  --   BUN 13 18   < > 12   < > 15 17 16   CREATININE 0.49* 0.57*   < > 0.65   < > 0.75* 0.73 0.7  CALCIUM 8.6* 8.5*   < > 8.6*  --  8.9 8.3*  --   MG 2.2 2.1  --   --   --   --   --   --   PHOS 3.4  --   --   --   --   --   --   --    < > = values in this interval not displayed.   Recent Labs    10/09/17 11/11/17 1800 12/30/17 01/18/18 1657  AST 13 17 21 20   ALT 8 12 12 13   ALKPHOS 79 75 92 91  BILITOT 0.4 0.3  --  0.5  PROT 6.3 6.3*  --  6.7  ALBUMIN 3.6 2.8*  --  3.2*   Recent Labs    09/04/17 1054  11/11/17 1350 11/17/17 11/25/17 0024 01/18/18 1657  WBC 12.2*   < > 10.3 9.1 11.8* 7.9  NEUTROABS 8.9*  --   --   --  8.8* 5.4  HGB 12.8*   < > 12.4* 11.9* 12.5* 11.6*  HCT 39.2   < > 40.0 35* 39.0 36.9*  MCV 97.3   < > 97.8  --  98.2 98.4  PLT 354   < > 300 312 284 260   < > = values in this interval not displayed.   Lab Results  Component Value Date   TSH 1.125 08/26/2017   Lab Results  Component Value Date   HGBA1C 5.6 12/07/2015   Lab Results  Component Value Date   CHOL 103 05/05/2017   HDL 39 (L) 05/05/2017   LDLCALC 43 05/05/2017   TRIG 129 05/05/2017   CHOLHDL 2.6 05/05/2017    Significant Diagnostic Results in last 30 days:  Dg Chest 2 View  Result Date: 01/18/2018 CLINICAL DATA:  Back pain for 3 weeks. New onset abdominal pain. Shortness of breath. EXAM: CHEST - 2 VIEW COMPARISON:  Chest radiograph 01/15/2018 FINDINGS: Unchanged position of left chest wall pacemaker leads. There is moderate cardiomegaly with aortic atherosclerosis and findings of  prior median sternotomy. There is a large hiatal hernia. Left basilar atelectasis without other consolidation. No pulmonary edema. No pneumothorax. Unchanged lower thoracic compression deformity. IMPRESSION: 1. No active cardiopulmonary disease. 2. Cardiomegaly  and aortic atherosclerosis. 3. Large hiatal hernia. 4. Unchanged appearance of lower thoracic compression deformity. Electronically Signed   By: Ulyses Jarred M.D.   On: 01/18/2018 17:36   Ct Abdomen Pelvis W Contrast  Result Date: 01/18/2018 CLINICAL DATA:  Back pain, abdominal pain EXAM: CT ABDOMEN AND PELVIS WITH CONTRAST TECHNIQUE: Multidetector CT imaging of the abdomen and pelvis was performed using the standard protocol following bolus administration of intravenous contrast. CONTRAST:  155mL ISOVUE-300 IOPAMIDOL (ISOVUE-300) INJECTION 61% COMPARISON:  11/11/2017 FINDINGS: Lower chest: Large hiatal hernia containing much of the stomach. Cardiomegaly. Compressive atelectasis in the left lower lobe. Small bilateral pleural effusions. Hepatobiliary: No focal hepatic abnormality. Gallbladder unremarkable. Pancreas: No focal abnormality or ductal dilatation. Spleen: No focal abnormality.  Normal size. Adrenals/Urinary Tract: Cysts in the kidneys bilaterally, the largest 5 cm in the lower pole of the right kidney. These are stable. No hydronephrosis. Adrenal glands unremarkable. Urinary bladder decompressed with Foley catheter in place. Stomach/Bowel: Moderate stool burden throughout the colon. No evidence of bowel obstruction. Vascular/Lymphatic: Aortic atherosclerosis. No enlarged abdominal or pelvic lymph nodes. Reproductive: No visible focal abnormality. Other: No free fluid or free air. Musculoskeletal: Moderate compression fracture involving the T11 and L3 vertebral bodies. These are new since prior CT. Diffuse degenerative disc disease. IMPRESSION: New T11 and L3 compression fractures since prior CT. Large hiatal hernia. Small bilateral effusions,  cardiomegaly. Bilateral renal cysts, stable. Moderate stool burden in the colon. Aortic atherosclerosis. Electronically Signed   By: Rolm Baptise M.D.   On: 01/18/2018 19:02   Ct L-spine No Charge  Result Date: 01/18/2018 CLINICAL DATA:  Back pain for 3 weeks EXAM: CT LUMBAR SPINE WITHOUT CONTRAST TECHNIQUE: Multidetector CT imaging of the lumbar spine was performed without intravenous contrast administration. Multiplanar CT image reconstructions were also generated. COMPARISON:  CT abdomen pelvis 11/11/2017 FINDINGS: Segmentation: Normal Alignment: Right convex scoliosis apex at L3. Vertebrae: Acute compression fracture at T11 with approximately 50% height loss, involving the superior endplate and anterior wall. Approximately 7 mm retropulsion. Acute or subacute compression fracture of L3 with approximately 50% height loss and no retropulsion. No other focal vertebral body lesion. Paraspinal and other soft tissues: Bilateral renal cysts, measuring up to 0.5 cm. Small right pleural effusion. Disc levels: T10-11: Unremarkable. T11-12: Retropulsion of the posterior wall of T11 causes mild spinal canal stenosis. T12-L1: No stenosis. L1-L2: Mild disc bulge and endplate spurring without stenosis. L2-3: Mild disc bulge and endplate spurring with moderate left foraminal stenosis, partially due to scoliotic curvature. L3-4: No spinal canal or neural foraminal stenosis. L4-5: Normal. L5-S1: Normal. IMPRESSION: 1. Acute compression fracture at T11 with approximately 50% height loss and 7 mm retropulsion. Mild spinal canal stenosis. 2. Acute or subacute compression fracture of L3 with 50% height loss and no retropulsion. 3. Moderate left L2-3 neural foraminal stenosis. Electronically Signed   By: Ulyses Jarred M.D.   On: 01/18/2018 19:09    Assessment/Plan T11 vertebral fracture ([place]) 01/18/09 CT showed acute compression frx T11 50% height loss, 29mm , acute L3 compression fracture 50% height loss.  Left L2-3 neural  foraminal stenosis. Pend surgical repair, Plavix should be held 5 days prior to the surgery per wife. Continue Tylenol 500mg  tid, Norco 5/325mg  bid for pain control.   L3 vertebral fracture ([place]) 01/18/09 CT showed acute compression frx T11 50% height loss, 38mm , acute L3 compression fracture 50% height loss.  Left L2-3 neural foraminal stenosis. Pend surgical repair, Plavix should be held 5 days prior to  the surgery per wife. Continue Norco 5/325mg  bid, Tylenol 500mg  tid, Robaxin 250mg  bid.   Bilateral renal cysts Incidental finding of bilateral renal cysts on CT 01/18/18, f/u urology 02/27/18.  RLS (restless legs syndrome) Hx of restless leg, continue prn Requip 0.5mg  tid.   Chronic constipation Constipation is stable, last BM yesterday, continue  Senokot S II qd, MiraLax qd, Trulance 3mg  qd.   GERD (gastroesophageal reflux disease) GERD stable, continue pantoprazole 40mg  qd.   Orthostatic hypotension Orthostatic hypotension, stable, continue  Midodrine 10mg  tid, Droxidopa 100mg  tid.   VT (ventricular tachycardia) (HCC) VT, pace maker, continue  Amiodarone 100mg  qd.   Depression with anxiety conitnue Prn Alprazolam 0.25mg  bid for anxiety.       Family/ staff Communication: plan of care reviewed with the patient, the patient's wife, and charge nurse.   Labs/tests ordered:  none  Time spend 25 minutes.

## 2018-02-13 NOTE — Assessment & Plan Note (Signed)
Constipation is stable, last BM yesterday, continue  Senokot S II qd, MiraLax qd, Trulance 3mg  qd.

## 2018-02-24 ENCOUNTER — Telehealth: Payer: Self-pay | Admitting: Pharmacist

## 2018-02-24 ENCOUNTER — Telehealth: Payer: Self-pay | Admitting: Cardiovascular Disease

## 2018-02-24 NOTE — Telephone Encounter (Signed)
New  Message        Patient calling to get auth. For medication   Pt c/o medication issue:  1. Name of Medication: [name]   2. How are you currently taking this medication (dosage and times per day)?   3. Are you having a reaction (difficulty breathing--STAT)? No  4. What is your medication issue? Need Authorization so it can be filled. [phone]

## 2018-02-24 NOTE — Telephone Encounter (Signed)
LMOM; daughter is to call back and clarify if patient still taking northera 100mg  TID or if patietn was able to titrate dose to 200mg  .

## 2018-02-24 NOTE — Telephone Encounter (Signed)
Rqst fwd to [facility].

## 2018-02-24 NOTE — Telephone Encounter (Signed)
I received the request to renewal yesterday 1/20. Will complete ASAP.

## 2018-02-26 NOTE — Telephone Encounter (Signed)
Spoke with daughter Caryl Asp - she confirmed patient still taking 100 mg.  Believes he takes tid, but thinks he may skip occasional doses based on home BP readings.    We just received PA approval for all of 2020 as well.

## 2018-03-03 ENCOUNTER — Other Ambulatory Visit: Payer: Self-pay | Admitting: Pharmacist

## 2018-03-03 ENCOUNTER — Telehealth: Payer: Self-pay | Admitting: Pharmacist

## 2018-03-03 MED ORDER — DROXIDOPA 100 MG PO CAPS: 100.0000 mg | ORAL_CAPSULE | Freq: Three times a day (TID) | ORAL | 6 refills | Status: DC

## 2018-03-03 NOTE — Telephone Encounter (Signed)
[place] PA renewal completed 02/25/2018. Approved through 02/04/2019

## 2018-03-04 ENCOUNTER — Other Ambulatory Visit: Payer: Self-pay | Admitting: Pharmacist

## 2018-03-04 MED ORDER — DROXIDOPA 100 MG PO CAPS: 100.0000 mg | ORAL_CAPSULE | Freq: Three times a day (TID) | ORAL | 6 refills | Status: DC

## 2018-03-06 LAB — CUP PACEART INCLINIC DEVICE CHECK
Date Time Interrogation Session: 20200131144640
Implantable Lead Implant Date: 20100309
Implantable Lead Implant Date: 20100309
Implantable Lead Location: 753859
Implantable Lead Location: 753860
Implantable Pulse Generator Implant Date: 20181219
Pulse Gen Model: 2272
Pulse Gen Serial Number: 8965776

## 2018-03-20 ENCOUNTER — Encounter: Payer: Self-pay | Admitting: Internal Medicine

## 2018-03-20 NOTE — Progress Notes (Signed)
Opened in error

## 2018-03-24 ENCOUNTER — Telehealth: Payer: Self-pay | Admitting: Physician Assistant

## 2018-03-24 ENCOUNTER — Telehealth: Payer: Self-pay | Admitting: Cardiovascular Disease

## 2018-03-24 NOTE — Telephone Encounter (Signed)
New Message   Pt c/o BP issue: STAT if pt c/o blurred vision, one-sided weakness or slurred speech  1. What are your last 5 BP readings? 182/89 ....wife only has one reading states the facility may have more.  Patient in [facility] facility.  2. Are you having any other symptoms (ex. Dizziness, headache, blurred vision, passed out)?  Dizziness wife's states he has everyday and nausea.    3. What is your BP issue?  Patient's bp has never been that high and he's been taking [name] and patient not sure if it's coming from that.

## 2018-03-24 NOTE — Telephone Encounter (Signed)
Patient wife called initially, but when I called back, the patient himself answered the phone.  He stated that he and his wife are concerned because his systolic blood pressures running very high at times.  He stated the [name] did a good job to control it but he has been off that recently.  He stated they needed the prior authorization.  He stated he had been feeling worse today, but is feeling better now and his systolic blood pressure has come down.  I advised that I could see phone notes where the pharmacist had pursued the prior authorization and it had been granted for the entire year of 2020.  The patient was grateful for the information and stated he would pass that on to his wife.  Rosaria Ferries, PA-C 03/24/2018 6:51 PM Beeper [phone]

## 2018-03-24 NOTE — Telephone Encounter (Signed)
Spoke with pt's wife who states today pt has been having c/o headache, dizziness, nauseous, and decreased appetite. She report facility checked pt's BP and it was 182/90. Wife states she is concerned as pt BP has never been that high and his on [name]. Wife states she doesn't have a repeat BP. Pt informed that pain, sickness, and other factors can increase your BP and typically meds arent adjusted based on one reading. Wife states she will have facility recheck pt's BP. Wife advised to contact after hour number as we close at 5 pm if BP consistently stay high or symptoms worsen. Wife verbalized understanding.

## 2018-03-27 ENCOUNTER — Encounter: Payer: Self-pay | Admitting: Cardiovascular Disease

## 2018-03-27 ENCOUNTER — Ambulatory Visit: Payer: Medicare Other | Admitting: Cardiovascular Disease

## 2018-03-27 VITALS — BP 105/62 | HR 73 | Ht 70.0 in | Wt 172.0 lb

## 2018-03-27 DIAGNOSIS — I472 Ventricular tachycardia, unspecified: Secondary | ICD-10-CM

## 2018-03-27 DIAGNOSIS — I495 Sick sinus syndrome: Secondary | ICD-10-CM | POA: Diagnosis not present

## 2018-03-27 DIAGNOSIS — I951 Orthostatic hypotension: Secondary | ICD-10-CM

## 2018-03-27 DIAGNOSIS — I5042 Chronic combined systolic (congestive) and diastolic (congestive) heart failure: Secondary | ICD-10-CM

## 2018-03-27 DIAGNOSIS — E7800 Pure hypercholesterolemia, unspecified: Secondary | ICD-10-CM

## 2018-03-27 DIAGNOSIS — Z79899 Other long term (current) drug therapy: Secondary | ICD-10-CM | POA: Diagnosis not present

## 2018-03-27 DIAGNOSIS — I350 Nonrheumatic aortic (valve) stenosis: Secondary | ICD-10-CM

## 2018-03-27 DIAGNOSIS — Z950 Presence of cardiac pacemaker: Secondary | ICD-10-CM | POA: Diagnosis not present

## 2018-03-27 DIAGNOSIS — I25810 Atherosclerosis of coronary artery bypass graft(s) without angina pectoris: Secondary | ICD-10-CM

## 2018-03-27 DIAGNOSIS — Z8673 Personal history of transient ischemic attack (TIA), and cerebral infarction without residual deficits: Secondary | ICD-10-CM

## 2018-03-27 NOTE — Progress Notes (Signed)
Patient ID: Adam [name], male   DOB: Nov 04, 1926, 85 y.o.   MRN: [id number]    Cardiology Office Note    Date:  03/28/2018   ID:  Adam [name], Adam [name] 08/16/1926, MRN [id number]  PCP:  Adam Dad, MD  Cardiologist:   Adam [name], MD   Chief complaint: pacemaker, orthostatic hypotension  History of Present Illness:  Adam [name] is a 85 y.o. male with a long-standing history of coronary artery disease and previous bypass surgery with an extensive scar from inferior wall myocardial infarction, ischemic cardiomyopathy with combined systolic and diastolic heart failure, history of sustained symptomatic ventricular tachycardia (probably "scar VT"), previous ischemic stroke, sinus node dysfunction with dual chamber permanent pacemaker (generator change 2018 [employer]), severe orthostatic hypotension that is felt to be neurogenic in etiology.   Current combination of droxidopa and midodrine seems to provide reasonable control of her symptoms of orthostatic hypotension, without have any recent problems with excessive hypotension when supine.  He walks daily, for short distances.  He wears compression stockings as well has only mild ankle edema daily edema; today it seems to be limited to his right ankle.  He denies any recent worsening leg edema, dyspnea at rest or with activity, angina, syncope or palpitations.  He is on chronic dual antiplatelet therapy following a November 2017 ischemic stroke, but does not take anticoagulants.  He has not had any falls, injuries or bleeding problems.  Pacemaker function is normal with battery at beginning of life, estimated longevity 9.4 years.  He does not have any detectable P waves, he is "atrially dependent", but he has normal AV conduction most of the time. Lead parameters remain normal.  He has 97 % atrial pacing and 37 % ventricular pacing.  He has not had any episodes of atrial fibrillation (never had afib) and has not had any recent episodes of  ventricular tachycardia (for which he takes amiodarone).  A handful of episodes of pacemaker tachycardia were seen in July 2019, none since.  He has CAD and is currently free of angina pectoris. He underwent bypass surgery 1996. He had placement of a stent to the SVG to RCA in 2011. His last cardiac catheterization in 2012 show that vessel to be widely patent but all his native coronary arteries are occluded and he is graft dependent. He is nuclear stress test in December 2015 showed an extensive inferior wall scar without any areas of ischemia. in June 2016 he presented with rapid palpitations associated with near-syncope, dyspnea and extreme fatigue , but did not lose consciousness. Interrogation of his pacemaker showed relatively slow ventricular tachycardia at around 150 bpm that appear to have a regular, monomorphic electrogram. His dose of amiodarone was increased and he has not had ventricular tachycardia since. He was evaluated by Dr. Caryl Comes. After some debate, we decided that a conservative approach is most appropriate in view of his declining overall functional status and advanced age. Unfortunately has also been developing progressive dementia, primarily manifested as short-term memory loss. He is now a resident at [facility]. In November 2017 he presented with a right lower extremity weakness and was diagnosed as having an ischemic stroke. He was prescribed an oral anticoagulant due to suspicion that his stroke was related to atrial fibrillation, but atrial fibrillation has never occurred/has never been recorded by his pacemaker so this medication was stopped.  Echo July 2019 shows stable LVEF 35-40%, moderate aortic stenosis with a mean gradient of 13 mmHg and an  estimated aortic valve area of 1.2 cm    Past Medical History:  Diagnosis Date  . Arthritis    "minor, back and sometimes knees" (12/15/2012)  . Bradycardia    AFib/SSS s/p St Jude PPM 04/12/2008  . CAD (coronary artery disease)  12/30/2014   CABG (LIMA-LAD, SVG-RCA, SVG-OM in 1996).  07/2009 BMS to SVG-RCA. Cath in 04/2010 with patent stents   . Cardiomyopathy, ischemic 08/25/2012  . CHF (congestive heart failure) ([place])   . Chronic knee pain 12/03/2014  . Combined congestive systolic and diastolic heart failure ([place]) 02/02/2015   Hx EF 41%. BNP 96.8 02/21/15 Torsemide 04/06/15 Na 142, K 4.6, Bun 16, creat 0.89 04/20/14 BNP 111.7, Na 142, K 4.6, Bun 16, creat 0.9   . Depression with anxiety 02/02/2015   02/21/15 Hgb A1c 5.8 03/10/15 MMSE 30/30   . Dizziness, after diuretic asscoiated with hypotension and responded to fluid bolus 06/05/2011   04/28/15 US carotid R+L normal bilateral arterial velocities.    Marland Kitchen Dyspnea 08/11/2014   Followed in Pulmonary clinic/ [facility]/ [place]  - 08/11/2014  Walked RA x 1 laps @ 185 ft each stopped due to fatigue/off balance/ slow pace/  no sob or desat  - PFT's  09/26/2014  FEV1 2.26 (85 % ) ratio 76  p no % improvement from saba with DLCO  67 % corrects to 93 % for alv volume      Since prev study 08/04/13 minimal change lung vol or dlco    . Embolic cerebral infarction ([place]) 12/06/2015  . Exertional shortness of breath    "sometimes walking" (12/15/2012)  . GERD (gastroesophageal reflux disease)   . Gout 02/09/2015  . Heart murmur    "just told I had one today" (12/15/2012)  . Hiatal hernia   . Hyperlipidemia   . Hypertension   . Hypothyroid   . Influenza A 03/10/2016  . Insomnia   . Melanoma of back ([place]) 1976  . Myocardial infarction [facility]) 1996; 2011   "both silent" (12/15/2012)  . Nonrheumatic aortic valve stenosis   . Orthostatic hypotension   . Osteoporosis, senile   . Pacemaker   . RBBB   . Restless leg 02/02/2015  . Right leg weakness 12/06/2015  . [facility] spotted fever   . S/P CABG x 4   . Sick sinus syndrome ([place]) 01/31/2014  . Sustained ventricular tachycardia ([place]) 07/27/2014  . Urinary retention 12/30/2014   has a cathrater    Past Surgical History:  Procedure  Laterality Date  . CARDIAC CATHETERIZATION  04/2010   LIMA to LAD patent,SVG to OM patent,no in-stnet restenosis RCA  . CATARACT EXTRACTION W/ INTRAOCULAR LENS  IMPLANT, BILATERAL Bilateral 2012  . CORONARY ANGIOPLASTY WITH STENT PLACEMENT  07/2009   bare metal stent to SVG to the RCA  . CORONARY ARTERY BYPASS GRAFT  1996   LIMA to LAD,SVG to RCA & SVG to OM  . INSERT / REPLACE / REMOVE PACEMAKER  2010  . MELANOMA EXCISION  05/1974 X2   "taken off my back" (12/15/2012)  . NM MYOVIEW LTD  06/2011   low risk  . PPM GENERATOR CHANGEOUT N/A 01/22/2017   Procedure: PPM GENERATOR CHANGEOUT;  Surgeon: Adam [name], MD;  Location: [facility];  Service: Cardiovascular;  Laterality: N/A;  . TONSILLECTOMY  1938  . TRANSURETHRAL RESECTION OF PROSTATE  1986  . US ECHOCARDIOGRAPHY  07/11/2009   EF 45-50%    Current Medications: Outpatient Medications Prior to Visit  Medication Sig  Dispense Refill  . acetaminophen (TYLENOL) 500 MG tablet Take 500 mg by mouth 3 (three) times daily.     Marland Kitchen ALPRAZolam (XANAX) 0.25 MG tablet Take 1 tablet (0.25 mg total) by mouth 2 (two) times daily as needed for anxiety. 60 tablet 0  . Amino Acids-Protein Hydrolys (FEEDING SUPPLEMENT, PRO-STAT SUGAR FREE 64,) LIQD Take 30 mLs by mouth daily.    Marland Kitchen amiodarone (PACERONE) 100 MG tablet Take 1 tablet (100 mg total) by mouth daily. 90 tablet 3  . aspirin EC 81 MG tablet Take 1 tablet (81 mg total) by mouth daily. 30 tablet 1  . calcitonin, salmon, (MIACALCIN/FORTICAL) 200 UNIT/ACT nasal spray 1 spray every other day. To right nostril    . Cholecalciferol (VITAMIN D3) 5000 UNITS CAPS Take 5,000 Units by mouth every Monday.     . clopidogrel (PLAVIX) 75 MG tablet TAKE 1 TABLET ONCE DAILY. (Patient taking differently: TAKE 1 TABLET ONCE DAILY.) 90 tablet 1  . Droxidopa 100 MG CAPS Take 100 mg by mouth 3 (three) times daily. 90 capsule 6  . fluticasone (CUTIVATE) 0.05 % cream Apply topically 2 (two) times daily as needed.  Apply small amount on  face and back    . guaiFENesin (ROBITUSSIN) 100 MG/5ML SOLN Take 5 mLs (100 mg total) by mouth every 4 (four) hours as needed for cough or to loosen phlegm. 1200 mL 0  . lactose free nutrition (BOOST) LIQD Take 237 mLs by mouth daily.    Marland Kitchen levothyroxine (SYNTHROID, LEVOTHROID) 100 MCG tablet Take 100 mcg by mouth daily before breakfast.    . Lidocaine 4 % PTCH Apply 1 patch topically daily. Left shoulder, apply to lower back for pain    . meclizine (ANTIVERT) 25 MG tablet Take 1 tablet (25 mg total) by mouth 2 (two) times daily as needed for dizziness. 30 tablet 0  . Melatonin 3 MG TABS Take 3 mg by mouth at bedtime.    . methocarbamol (ROBAXIN) 500 MG tablet Take 250 mg by mouth 2 (two) times daily.     . midodrine (PROAMATINE) 10 MG tablet Take 1 tablet (10 mg total) by mouth 3 (three) times daily.    . Multiple Vitamins-Minerals (DECUBI-VITE) CAPS Take 1 capsule by mouth daily.    . NON FORMULARY Take 1 Container by mouth daily.    . pantoprazole (PROTONIX) 40 MG tablet TAKE 1 TABLET BY MOUTH TWICE DAILY. (Patient taking differently: Take 40 mg by mouth 2 (two) times daily. ) 60 tablet 11  . Plecanatide (TRULANCE) 3 MG TABS Take 3 mg by mouth daily. 30 tablet 11  . polyethylene glycol (MIRALAX / GLYCOLAX) packet Take 7 capfuls in 32 ounces of water once.  After that you can take 1-2 capfuls in an 8 ounce glass of water daily. 14 each 0  . pravastatin (PRAVACHOL) 40 MG tablet Take 1 tablet (40 mg total) by mouth at bedtime. 90 tablet 3  . rOPINIRole (REQUIP) 0.5 MG tablet TAKE 1 TABLET 3 TIMES A DAY AS NEEDED. (Patient taking differently: Take 0.5 mg by mouth 3 (three) times daily as needed (restless leg syndrome). ) 90 tablet 1  . sennosides-docusate sodium (SENOKOT-S) 8.6-50 MG tablet Take 2 tablets by mouth daily.     Marland Kitchen torsemide (DEMADEX) 10 MG tablet Take 1 tablet (10 mg total) by mouth daily.     No facility-administered medications prior to visit.      Allergies:    Altace [ramipril]; Crestor [rosuvastatin calcium]; Penicillins; Sulfa antibiotics; and  Sulfamethoxazole-trimethoprim   Social History   Socioeconomic History  . Marital status: Married    Spouse name: Not on file  . Number of children: 2  . Years of education: Masters  . Highest education level: Not on file  Occupational History  . Occupation: Retired Company secretary -Pensions consultant  Social Needs  . Financial resource strain: Not hard at all  . Food insecurity:    Worry: Never true    Inability: Never true  . Transportation needs:    Medical: No    Non-medical: No  Tobacco Use  . Smoking status: Never Smoker  . Smokeless tobacco: Never Used  Substance and Sexual Activity  . Alcohol use: No    Alcohol/week: 0.0 standard drinks  . Drug use: No  . Sexual activity: Never  Lifestyle  . Physical activity:    Days per week: 7 days    Minutes per session: 10 min  . Stress: Not at all  Relationships  . Social connections:    Talks on phone: More than three times a week    Gets together: More than three times a week    Attends religious service: Never    Active member of club or organization: No    Attends meetings of clubs or organizations: Never    Relationship status: Married  Other Topics Concern  . Not on file  Social History Narrative   Lives at [place] to [place] 01/09/15   Married - Violet   Never smoked   Alcohol none   Previously employed as Scientist, forensic for [employer].         Diet:Low sodium   Do you drink/eat things with caffeine? No   Marital status: Married                              What year were you married?1950   Do you live in a house, apartment, assisted living, condo, trailer, etc)?    Is it one or more stories? 1   How many persons live in your home? 2   Do you have any pets in your home? No   Current or past profession: Minister, [employer]ore[employer]   Do you exercise?     Very Little                                                  Type & how often:    Do you have a living will?  Yes   Do you have a DNR Form? Yes   Do you have a POA/HPOA forms? Yes     Family History:  The patient's family history includes Anuerysm in his son; Coronary artery disease in his father and mother; Diabetes in his father and mother; Heart disease in his brother, father, and mother; Lung cancer in his father.   ROS:   Please see the history of present illness.    ROS All other systems reviewed and are negative.   PHYSICAL EXAM:   VS:  BP 105/62   Pulse 73   Ht 5\' 10"  (1.778 m)   Wt 172 lb (78 kg)   SpO2 98%   BMI 24.68 kg/m      General: Alert, oriented x3, no distress, appears well, although he is very frail Head: no  evidence of trauma, PERRL, EOMI, no exophtalmos or lid lag, no myxedema, no xanthelasma; normal ears, nose and oropharynx Neck: normal jugular venous pulsations and no hepatojugular reflux; brisk carotid pulses without delay and no carotid bruits Chest: clear to auscultation, no signs of consolidation by percussion or palpation, normal fremitus, symmetrical and full respiratory excursions Cardiovascular: normal position and quality of the apical impulse, regular rhythm, normal first and widely split second heart sounds, early to mid peaking 2/6 aortic ejection murmur, no diastolic murmurs, rubs or gallops.  Healthy pacemaker palpable left subclavian Abdomen: no tenderness or distention, no masses by palpation, no abnormal pulsatility or arterial bruits, normal bowel sounds, no hepatosplenomegaly Extremities: no clubbing, cyanosis; 1+ ankle edema only on the right side Neurological: grossly nonfocal Psych: Normal mood and affect  Wt Readings from Last 3 Encounters:  03/27/18 172 lb (78 kg)  03/20/18 172 lb 1.6 oz (78.1 kg)  02/13/18 170 lb 14.4 oz (77.5 kg)      Studies/Labs Reviewed:   EKG:  EKG is not ordered today.  Recent Labs: 08/26/2017: TSH 1.125 08/27/2017: Magnesium 2.1 01/18/2018: ALT 13;  B Natriuretic Peptide 151.6; Hemoglobin 11.6; Platelets 260 01/19/2018: BUN 16; Creatinine 0.7; Potassium 4.6; Sodium 139   Lipid Panel    Component Value Date/Time   CHOL 103 05/05/2017 0000   TRIG 129 05/05/2017 0000   HDL 39 (L) 05/05/2017 0000   CHOLHDL 2.6 05/05/2017 0000   VLDL 20 12/07/2015 0449   LDLCALC 43 05/05/2017 0000     ASSESSMENT:    1. Ventricular tachycardia ([place])   2. On amiodarone therapy   3. SSS (sick sinus syndrome) ([place])   4. Pacemaker   5. Chronic combined systolic (congestive) and diastolic (congestive) heart failure ([place])   6. Orthostatic hypotension   7. Coronary artery disease involving coronary bypass graft of native heart without angina pectoris   8. History of arterial ischemic stroke   9. Hypercholesterolemia      PLAN:  In order of problems listed above:  1. VT: Previous inferior wall scar.  None seen in over 3 years. No VT seen since we reduced amiodarone to 100 mg daily.   2. Amiodarone: No side effects are obvious.  Monitor liver and thyroid test every 6 months.  Report respiratory symptoms promptly. 3. SSS: He has no intrinsic atrial activity. "Atrially dependent".  Although he has significant AV conduction abnormalities, has a stable 40% prevalence of V pacing. 4. PM: Normal comprehensive check in the office today. Remote download in 3 months. Office visit in 6 months. 5. CHF: Appears clinically close to euvolemia.  He had a previous heart failure decompensation when he was prescribed fludrocortisone for orthostatic hypotension.  He is on a very low dose of diuretic. 6. Orthostatic hypotension: Years ago he had severe hypertension requiring multiple agents for control.  Now, orthostatic hypotension is one of his most serious source of morbidity.  Current doses of [name] and Proamatine seem to provide fair control of dizziness/pre-syncope without hypertension. 7. CAD: Asymptomatic.  Has an extensive scar (probably the source of his  previous VT), but no reversible ischemia on his most recent nuclear study from November 2014. He has occluded native arteries and is graft dependent (LIMA to LAD, SVG to RCA, SVG to OM; status post stent in SVG to RCA 2011, patent by cath March 2012). November 2014 nuclear study shows inferior scar without ischemia, EF 41%.Conservative management is recommended. 8. History of CVA: On ASA and clopidogrel. His pacemaker has  never shown evidence of atrial arrhythmia (flutter or fibrillation) that could explain his stroke. Anticoagulants are not indicated. 9. HLP: LDL at target. 10. AS: Moderate by echo in July 2019.  Asymptomatic, but he is extremely sedentary.  He is not a candidate for surgical aortic valve replacement and is a questionable candidate for TAVR.  Recheck echo before his follow-up in 6 months.    Medication Adjustments/Labs and Tests Ordered: Current medicines are reviewed at length with the patient today.  Concerns regarding medicines are outlined above.  Medication changes, Labs and Tests ordered today are listed in the Patient Instructions below. Patient Instructions  Medication Instructions:  NO CHANGE If you need a refill on your cardiac medications before your next appointment, please call your pharmacy.   Lab work: Your physician recommends that you return for lab work PRIOR TO EATING If you have labs (blood work) drawn today and your tests are completely normal, you will receive your results only by: Marland Kitchen MyChart Message (if you have MyChart) OR . A paper copy in the mail If you have any lab test that is abnormal or we need to change your treatment, we will call you to review the results.  Follow-Up: At [facility], you and your health needs are our priority.  As part of our continuing mission to provide you with exceptional heart care, we have created designated Provider Care Teams.  These Care Teams include your primary Cardiologist (physician) and Advanced Practice  Providers (APPs -  Physician Assistants and Nurse Practitioners) who all work together to provide you with the care you need, when you need it. You will need a follow up appointment in 6 months.  Please call our office 2 months in advance to schedule this appointment.  You may see Adam [name], MD or one of the following Advanced Practice Providers on your designated Care Team: [place], [place] . Fabian Sharp, PA-C        Signed, Adam [name], MD  03/28/2018 10:53 AM    [facility] [address], [place], [place]  [address] Phone: [phone]; Fax: [phone]

## 2018-03-27 NOTE — Patient Instructions (Signed)
Medication Instructions:  NO CHANGE If you need a refill on your cardiac medications before your next appointment, please call your pharmacy.   Lab work: Your physician recommends that you return for lab work PRIOR TO EATING If you have labs (blood work) drawn today and your tests are completely normal, you will receive your results only by: Marland Kitchen MyChart Message (if you have MyChart) OR . A paper copy in the mail If you have any lab test that is abnormal or we need to change your treatment, we will call you to review the results.  Follow-Up: At [facility], you and your health needs are our priority.  As part of our continuing mission to provide you with exceptional heart care, we have created designated Provider Care Teams.  These Care Teams include your primary Cardiologist (physician) and Advanced Practice Providers (APPs -  Physician Assistants and Nurse Practitioners) who all work together to provide you with the care you need, when you need it. You will need a follow up appointment in 6 months.  Please call our office 2 months in advance to schedule this appointment.  You may see [name], MD or one of the following Advanced Practice Providers on your designated Care Team: [place], [place] . Fabian Sharp, PA-C

## 2018-03-28 DIAGNOSIS — I5042 Chronic combined systolic (congestive) and diastolic (congestive) heart failure: Secondary | ICD-10-CM | POA: Insufficient documentation

## 2018-03-28 DIAGNOSIS — I25810 Atherosclerosis of coronary artery bypass graft(s) without angina pectoris: Secondary | ICD-10-CM | POA: Insufficient documentation

## 2018-03-28 DIAGNOSIS — Z79899 Other long term (current) drug therapy: Secondary | ICD-10-CM | POA: Insufficient documentation

## 2018-03-30 ENCOUNTER — Non-Acute Institutional Stay (SKILLED_NURSING_FACILITY): Payer: Medicare Other | Admitting: Family

## 2018-03-30 ENCOUNTER — Encounter: Payer: Self-pay | Admitting: Family

## 2018-03-30 DIAGNOSIS — L89152 Pressure ulcer of sacral region, stage 2: Secondary | ICD-10-CM

## 2018-03-30 DIAGNOSIS — I5042 Chronic combined systolic (congestive) and diastolic (congestive) heart failure: Secondary | ICD-10-CM

## 2018-03-30 DIAGNOSIS — E782 Mixed hyperlipidemia: Secondary | ICD-10-CM

## 2018-03-30 DIAGNOSIS — E039 Hypothyroidism, unspecified: Secondary | ICD-10-CM

## 2018-03-30 DIAGNOSIS — G2581 Restless legs syndrome: Secondary | ICD-10-CM

## 2018-03-30 DIAGNOSIS — M545 Low back pain: Secondary | ICD-10-CM

## 2018-03-30 DIAGNOSIS — G8929 Other chronic pain: Secondary | ICD-10-CM

## 2018-03-30 DIAGNOSIS — K5909 Other constipation: Secondary | ICD-10-CM

## 2018-03-30 LAB — LIPID PANEL
Cholesterol: 93 (ref 0–200)
HDL: 35 (ref 35–70)
LDL Cholesterol: 37
Triglycerides: 127 (ref 40–160)

## 2018-03-30 LAB — TSH: TSH: 3.38 (ref 0.41–5.90)

## 2018-03-30 NOTE — Progress Notes (Signed)
Location:  [facility] Room Number: [place] of Service:  SNF [phone]) Provider: Dinah Ngetich FNP-C   [name], MD  Patient Care Team: [name], MD as PCP - General (Internal Medicine) [name], MD as PCP - Cardiology (Cardiology) [name], MD as Attending Physician (Cardiology) Vevelyn Royals, MD as Consulting Physician (Ophthalmology) Franchot Gallo, MD as Consulting Physician (Urology) Tanda Rockers, MD as Consulting Physician (Pulmonary Disease) Allyn Kenner, MD (Dermatology) Deliah Goody, PA-C as Physician Assistant (Physician Assistant) Mast, Man X, NP as Nurse Practitioner (Internal Medicine)  Extended Emergency Contact Information Primary Emergency Contact: Choquette,[name] Address: [place],  [place] Phone: [phone] Mobile Phone: [phone] Relation: Spouse Secondary Emergency Contact: [name],Allers  [place] Mobile Phone: [phone] Relation: Daughter  Code Status:  Full Code  Goals of care: Advanced Directive information Advanced Directives 03/30/2018  Does Patient Have a Medical Advance Directive? Yes  Type of Paramedic of [place];Living will  Does patient want to make changes to medical advance directive? No - Patient declined  Copy of [place] in Chart? Yes - validated most recent copy scanned in chart (See row information)  Would patient like information on creating a medical advance directive? -  Pre-existing out of facility DNR order (yellow form or pink MOST form) -     Chief Complaint  Patient presents with  . Medical Management of Chronic Issues    Routine Visit    HPI:  Pt is a 85 y.o. male seen today [facility] for medical management of chronic diseases.He is seen in his room today.He states having a good day today " pain under control usually I have bad pains on the back  and abdomen". Also states has occasional dizziness upon standing.He spends most time on wheelchair and uses walker with assistance.He has had no recent acute issues,hospitalization or fall episode. He was recently seen 03/27/2018 by cardiology for dizziness,orthostatic B/p and pacemaker check.No afib or ventricular tachycardia noted.TSH and lipid panel was ordered and instructed to follow up with cardiology in 6 months.  He states has some open areas on his bottom.No recent weight changes or fall episodes.      Past Medical History:  Diagnosis Date  . Arthritis    "minor, back and sometimes knees" (12/15/2012)  . Bradycardia    AFib/SSS s/p St Jude PPM 04/12/2008  . CAD (coronary artery disease) 12/30/2014   CABG (LIMA-LAD, SVG-RCA, SVG-OM in 1996).  07/2009 BMS to SVG-RCA. Cath in 04/2010 with patent stents   . Cardiomyopathy, ischemic 08/25/2012  . CHF (congestive heart failure) ([place])   . Chronic knee pain 12/03/2014  . Combined congestive systolic and diastolic heart failure ([place]) 02/02/2015   Hx EF 41%. BNP 96.8 02/21/15 Torsemide 04/06/15 Na 142, K 4.6, Bun 16, creat 0.89 04/20/14 BNP 111.7, Na 142, K 4.6, Bun 16, creat 0.9   . Depression with anxiety 02/02/2015   02/21/15 Hgb A1c 5.8 03/10/15 MMSE 30/30   . Dizziness, after diuretic asscoiated with hypotension and responded to fluid bolus 06/05/2011   04/28/15 US carotid R+L normal bilateral arterial velocities.    Marland Kitchen Dyspnea 08/11/2014   Followed in Pulmonary clinic/ [facility]/ [facility]  - 08/11/2014  Walked RA x 1 laps @ 185 ft each stopped due to fatigue/off balance/ slow pace/  no sob or desat  - PFT's  09/26/2014  FEV1 2.26 (85 % ) ratio 76  p no % improvement from saba with DLCO  67 % corrects to 93 % for alv volume      Since prev study 08/04/13 minimal change lung vol or dlco    . Embolic cerebral infarction ([place]) 12/06/2015  . Exertional shortness of breath    "sometimes walking" (12/15/2012)  . GERD (gastroesophageal reflux disease)   . Gout  02/09/2015  . Heart murmur    "just told I had one today" (12/15/2012)  . Hiatal hernia   . Hyperlipidemia   . Hypertension   . Hypothyroid   . Influenza A 03/10/2016  . Insomnia   . Melanoma of back ([place]) 1976  . Myocardial infarction [facility]) 1996; 2011   "both silent" (12/15/2012)  . Nonrheumatic aortic valve stenosis   . Orthostatic hypotension   . Osteoporosis, senile   . Pacemaker   . RBBB   . Restless leg 02/02/2015  . Right leg weakness 12/06/2015  . [facility] spotted fever   . S/P CABG x 4   . Sick sinus syndrome ([place]) 01/31/2014  . Sustained ventricular tachycardia ([place]) 07/27/2014  . Urinary retention 12/30/2014   has a cathrater   Past Surgical History:  Procedure Laterality Date  . CARDIAC CATHETERIZATION  04/2010   LIMA to LAD patent,SVG to OM patent,no in-stnet restenosis RCA  . CATARACT EXTRACTION W/ INTRAOCULAR LENS  IMPLANT, BILATERAL Bilateral 2012  . CORONARY ANGIOPLASTY WITH STENT PLACEMENT  07/2009   bare metal stent to SVG to the RCA  . CORONARY ARTERY BYPASS GRAFT  1996   LIMA to LAD,SVG to RCA & SVG to OM  . INSERT / REPLACE / REMOVE PACEMAKER  2010  . MELANOMA EXCISION  05/1974 X2   "taken off my back" (12/15/2012)  . NM MYOVIEW LTD  06/2011   low risk  . PPM GENERATOR CHANGEOUT N/A 01/22/2017   Procedure: PPM GENERATOR CHANGEOUT;  Surgeon: [name], MD;  Location: [facility];  Service: Cardiovascular;  Laterality: N/A;  . TONSILLECTOMY  1938  . TRANSURETHRAL RESECTION OF PROSTATE  1986  . US ECHOCARDIOGRAPHY  07/11/2009   EF 45-50%    Allergies  Allergen Reactions  . Altace [Ramipril] Cough  . Crestor [Rosuvastatin Calcium] Rash  . Penicillins Rash and Other (See Comments)    Has patient had a PCN reaction causing immediate rash, facial/tongue/throat swelling, SOB or lightheadedness with hypotension: Yes Has patient had a PCN reaction causing severe rash involving mucus membranes or skin necrosis: No Has patient had a PCN  reaction that required hospitalization No Has patient had a PCN reaction occurring within the last 10 years: No If all of the above answers are "NO", then may proceed with Cephalosporin use.   . Sulfa Antibiotics Rash  . Sulfamethoxazole-Trimethoprim Rash    Allergies as of 03/30/2018      Reactions   Altace [ramipril] Cough   Crestor [rosuvastatin Calcium] Rash   Penicillins Rash, Other (See Comments)   Has patient had a PCN reaction causing immediate rash, facial/tongue/throat swelling, SOB or lightheadedness with hypotension: Yes Has patient had a PCN reaction causing severe rash involving mucus membranes or skin necrosis: No Has patient had a PCN reaction that required hospitalization No Has patient had a PCN reaction occurring within the last 10 years: No If all of the above answers are "NO", then may proceed with Cephalosporin use.   Sulfa Antibiotics Rash   Sulfamethoxazole-trimethoprim Rash      Medication  List       Accurate as of March 30, 2018  3:32 PM. Always use your most recent med list.        acetaminophen 500 MG tablet Commonly known as:  TYLENOL Take 500 mg by mouth 3 (three) times daily.   ALPRAZolam 0.25 MG tablet Commonly known as:  XANAX Take 1 tablet (0.25 mg total) by mouth 2 (two) times daily as needed for anxiety.   amiodarone 100 MG tablet Commonly known as:  PACERONE Take 1 tablet (100 mg total) by mouth daily.   aspirin EC 81 MG tablet Take 1 tablet (81 mg total) by mouth daily.   calcitonin (salmon) 200 UNIT/ACT nasal spray Commonly known as:  MIACALCIN/FORTICAL 1 spray every other day. To right nostril   clopidogrel 75 MG tablet Commonly known as:  PLAVIX TAKE 1 TABLET ONCE DAILY.   DECUBI-VITE Caps Take 1 capsule by mouth daily.   Droxidopa 100 MG Caps Take 100 mg by mouth 3 (three) times daily.   feeding supplement (PRO-STAT SUGAR FREE 64) Liqd Take 30 mLs by mouth daily.   fluticasone 0.05 % cream Commonly known as:   CUTIVATE Apply topically 2 (two) times daily as needed. Apply small amount on  face and back   guaiFENesin 100 MG/5ML Soln Commonly known as:  ROBITUSSIN Take 5 mLs (100 mg total) by mouth every 4 (four) hours as needed for cough or to loosen phlegm.   lactose free nutrition Liqd Take 237 mLs by mouth daily.   levothyroxine 100 MCG tablet Commonly known as:  SYNTHROID, LEVOTHROID Take 100 mcg by mouth daily before breakfast.   Lidocaine 4 % Ptch Apply 1 patch topically daily. Left shoulder, apply to lower back for pain   meclizine 25 MG tablet Commonly known as:  ANTIVERT Take 1 tablet (25 mg total) by mouth 2 (two) times daily as needed for dizziness.   Melatonin 3 MG Tabs Take 3 mg by mouth at bedtime.   methocarbamol 500 MG tablet Commonly known as:  ROBAXIN Take 250 mg by mouth 2 (two) times daily.   midodrine 10 MG tablet Commonly known as:  PROAMATINE Take 1 tablet (10 mg total) by mouth 3 (three) times daily.   NON FORMULARY Take 1 Container by mouth daily. Boost Lactose Reduced   pantoprazole 40 MG tablet Commonly known as:  PROTONIX TAKE 1 TABLET BY MOUTH TWICE DAILY.   Plecanatide 3 MG Tabs Commonly known as:  TRULANCE Take 3 mg by mouth daily.   polyethylene glycol packet Commonly known as:  MIRALAX / GLYCOLAX Take 7 capfuls in 32 ounces of water once.  After that you can take 1-2 capfuls in an 8 ounce glass of water daily.   pravastatin 40 MG tablet Commonly known as:  PRAVACHOL Take 1 tablet (40 mg total) by mouth at bedtime.   rOPINIRole 0.5 MG tablet Commonly known as:  REQUIP TAKE 1 TABLET 3 TIMES A DAY AS NEEDED.   sennosides-docusate sodium 8.6-50 MG tablet Commonly known as:  SENOKOT-S Take 2 tablets by mouth daily.   torsemide 10 MG tablet Commonly known as:  DEMADEX Take 1 tablet (10 mg total) by mouth daily.   Vitamin D3 125 MCG (5000 UT) Caps Take 5,000 Units by mouth every Monday.       Review of Systems  Constitutional:  Negative for appetite change, chills, fatigue, fever and unexpected weight change.  HENT: Positive for hearing loss. Negative for congestion, rhinorrhea, sinus pressure, sinus pain, sneezing, sore  throat and tinnitus.   Eyes: Negative for discharge, redness and itching.  Respiratory: Negative for cough, chest tightness, shortness of breath and wheezing.   Cardiovascular: Positive for leg swelling. Negative for chest pain and palpitations.  Gastrointestinal: Negative for abdominal distention, abdominal pain, constipation, diarrhea, nausea and vomiting.  Endocrine: Negative for cold intolerance, heat intolerance, polydipsia, polyphagia and polyuria.  Genitourinary: Negative for flank pain and urgency.  Musculoskeletal: Positive for arthralgias, back pain and gait problem.  Skin: Positive for wound. Negative for color change, pallor and rash.       Sacral ulcer   Neurological: Negative for light-headedness and headaches.       Occasional dizziness and orthostatic hypotension.asymptomatic this visit.   Hematological: Does not bruise/bleed easily.  Psychiatric/Behavioral: Negative for agitation, confusion and sleep disturbance. The patient is not nervous/anxious.     Immunization History  Administered Date(s) Administered  . Influenza, High Dose Seasonal PF 11/01/2014, 11/13/2016  . Influenza,inj,Quad PF,6+ Mos 11/24/2017  . Influenza,inj,quad, With Preservative 11/17/2017  . Influenza-Unspecified 12/05/2013, 11/05/2014, 11/16/2015  . PPD Test 03/07/2014  . Pneumococcal Conjugate-13 10/05/2009  . Pneumococcal Polysaccharide-23 11/05/2005  . Pneumococcal-Unspecified 10/05/2009  . Tdap 08/02/2014  . Zoster 11/05/2005   Pertinent  Health Maintenance Due  Topic Date Due  . PNA vac Low Risk Adult (2 of 2 - PCV13) 10/06/2010  . DEXA SCAN  12/14/2024  . INFLUENZA VACCINE  Completed   Fall Risk  10/09/2017 10/02/2016 10/30/2015 09/05/2015 02/02/2015  Falls in the past year? No No No No No  Risk  for fall due to : - - Impaired balance/gait - -    Vitals:   03/30/18 1015  BP: 118/74  Pulse: 76  Resp: 20  Temp: (!) 97.3 F (36.3 C)  TempSrc: Oral  SpO2: 98%  Weight: 171 lb 8 oz (77.8 kg)  Height: 5\' 10"  (1.778 m)   Body mass index is 24.61 kg/m. Physical Exam Vitals signs and nursing note reviewed.  Constitutional:      General: He is not in acute distress.    Appearance: He is normal weight.  HENT:     Head: Normocephalic.     Right Ear: Tympanic membrane, ear canal and external ear normal. There is no impacted cerumen.     Left Ear: Ear canal and external ear normal. There is no impacted cerumen.     Nose: Nose normal. No congestion or rhinorrhea.     Mouth/Throat:     Mouth: Mucous membranes are moist.     Pharynx: Oropharynx is clear. No oropharyngeal exudate or posterior oropharyngeal erythema.  Eyes:     General: No scleral icterus.       Right eye: No discharge.        Left eye: No discharge.     Conjunctiva/sclera: Conjunctivae normal.     Pupils: Pupils are equal, round, and reactive to light.  Neck:     Musculoskeletal: Normal range of motion. No neck rigidity or muscular tenderness.     Vascular: No carotid bruit.  Cardiovascular:     Pulses: Normal pulses.     Heart sounds: Murmur present. No friction rub. No gallop.      Comments: Pace marker Pulmonary:     Effort: Pulmonary effort is normal. No respiratory distress.     Breath sounds: Normal breath sounds. No wheezing, rhonchi or rales.  Chest:     Chest wall: No tenderness.  Abdominal:     General: Bowel sounds are normal. There is no  distension.     Palpations: Abdomen is soft. There is no mass.     Tenderness: There is no abdominal tenderness. There is no right CVA tenderness, left CVA tenderness, guarding or rebound.  Musculoskeletal:        General: No tenderness.     Comments: Unsteady gait uses wheelchair and walker with assistance.bilateral lower extremities trace edema knee high ted  hose in place.   Lymphadenopathy:     Cervical: No cervical adenopathy.  Skin:    General: Skin is warm and dry.     Coloration: Skin is not pale.     Findings: No erythema or rash.     Comments: 1.right sacral area stage 2 pressure ulcer x 2 areas wound bed and surrounding skin without any signs of infection. 2. Left sacral area with x  1 stage 2 ulcer wound bed without any signs of infection.  Neurological:     Mental Status: He is alert.     Sensory: No sensory deficit.     Motor: No weakness.     Coordination: Coordination normal.     Gait: Gait abnormal.     Comments: Alert and oriented to person and place   Psychiatric:        Mood and Affect: Mood normal.        Behavior: Behavior normal.        Thought Content: Thought content normal.        Judgment: Judgment normal.     Labs reviewed: Recent Labs    08/26/17 0426 08/27/17 0423  11/25/17 0024  01/15/18 1207 01/18/18 1657 01/19/18  NA 139 142   < > 138   < > 139 139 139  K 4.2 3.8   < > 4.1   < > 4.1 3.8 4.6  CL 107 108   < > 104  --  101 104  --   CO2 25 26   < > 26  --  24 26  --   GLUCOSE 104* 109*   < > 112*  --  100* 127*  --   BUN 13 18   < > 12   < > 15 17 16   CREATININE 0.49* 0.57*   < > 0.65   < > 0.75* 0.73 0.7  CALCIUM 8.6* 8.5*   < > 8.6*  --  8.9 8.3*  --   MG 2.2 2.1  --   --   --   --   --   --   PHOS 3.4  --   --   --   --   --   --   --    < > = values in this interval not displayed.   Recent Labs    10/09/17 11/11/17 1800 12/30/17 01/18/18 1657  AST 13 17 21 20   ALT 8 12 12 13   ALKPHOS 79 75 92 91  BILITOT 0.4 0.3  --  0.5  PROT 6.3 6.3*  --  6.7  ALBUMIN 3.6 2.8*  --  3.2*   Recent Labs    09/04/17 1054  11/11/17 1350 11/17/17 11/25/17 0024 01/18/18 1657  WBC 12.2*   < > 10.3 9.1 11.8* 7.9  NEUTROABS 8.9*  --   --   --  8.8* 5.4  HGB 12.8*   < > 12.4* 11.9* 12.5* 11.6*  HCT 39.2   < > 40.0 35* 39.0 36.9*  MCV 97.3   < > 97.8  --  98.2 98.4  PLT  354   < > 300 312 284 260   <  > = values in this interval not displayed.   Lab Results  Component Value Date   TSH 1.125 08/26/2017   Lab Results  Component Value Date   HGBA1C 5.6 12/07/2015   Lab Results  Component Value Date   CHOL 103 05/05/2017   HDL 39 (L) 05/05/2017   LDLCALC 43 05/05/2017   TRIG 129 05/05/2017   CHOLHDL 2.6 05/05/2017    Significant Diagnostic Results in last 30 days:  No results found.  Assessment/Plan 1. Pressure ulcer of sacral region, stage 2 (HCC) Afebrile.Right sacral area stage 2 pressure ulcer x 2 areas wound bed and surrounding skin without any signs of infection. Left sacral area with x  1 stage 2 ulcer wound bed without any signs of infection. - Has a gel cushion encourage to stand up after prolong sitting to improve circulation.  - continue on boost supplement,oral intake and hydration  - cleanse pressure ulcer with saline,pat dry then apply small amount of hydrogel to keep wound bed moist and cover with allyvn dressing for extra cushioning and protection.change dressing every 3 days as needed if dressing is soiled.    2. Chronic combined systolic and diastolic congestive heart failure (HCC) Trace edema to bilateral lower extremities.No abrupt weight gain and lungs CTA.continue on knee high ted hose and torsemide 10 mg tablet daily.continue to monitor weight.   3. RLS (restless legs syndrome) Chronic.continue on Requip 0.5 mg tablet three times as needed.   4. Hypothyroidism, unspecified type Lab Results  Component Value Date   TSH 1.125 08/26/2017  TSH level ordered by cardiology awaiting results.continue on levothyroxine 100 mcg tablet daily.    5. Mixed hyperlipidemia Lipid panel ordered by cardiology awaiting results.continue on pravastatin 40 mg tablet daily.  6. Chronic bilateral low back pain without sciatica Continue on topical patch and Robaxin 250 mg tablet twice daily  7. Chronic constipation Current regimen effective.continue on miralax and senokot-S  two tablets daily.   Family/ staff Communication: Reviewed plan of care with patient and facility Nurse supervisor   Labs/tests ordered: None   Sandrea Hughs, NP

## 2018-04-03 LAB — CUP PACEART INCLINIC DEVICE CHECK
Date Time Interrogation Session: 20200228104455
Implantable Lead Implant Date: 20100309
Implantable Lead Implant Date: 20100309
Implantable Lead Location: 753859
Implantable Lead Location: 753860
Implantable Pulse Generator Implant Date: 20181219
Pulse Gen Model: 2272
Pulse Gen Serial Number: 8965776

## 2018-04-13 ENCOUNTER — Other Ambulatory Visit: Payer: Self-pay

## 2018-04-13 MED ORDER — ALPRAZOLAM 0.25 MG PO TABS: 0.2500 mg | ORAL_TABLET | Freq: Two times a day (BID) | ORAL | 0 refills | Status: DC | PRN

## 2018-04-21 ENCOUNTER — Encounter: Payer: Self-pay | Admitting: Nurse Practitioner

## 2018-04-21 ENCOUNTER — Non-Acute Institutional Stay (SKILLED_NURSING_FACILITY): Payer: Medicare Other | Admitting: Nurse Practitioner

## 2018-04-21 DIAGNOSIS — M1990 Unspecified osteoarthritis, unspecified site: Secondary | ICD-10-CM

## 2018-04-21 DIAGNOSIS — I5042 Chronic combined systolic (congestive) and diastolic (congestive) heart failure: Secondary | ICD-10-CM | POA: Diagnosis not present

## 2018-04-21 DIAGNOSIS — F418 Other specified anxiety disorders: Secondary | ICD-10-CM | POA: Diagnosis not present

## 2018-04-21 NOTE — Assessment & Plan Note (Addendum)
Early am awake, difficulty returning to asleep, Hx of Trazodone use. Will continue Alprazolam bid prn. Adding Cymbalta 37m qd. Update CBC/diff, CMP/eGFR, Observe.

## 2018-04-21 NOTE — Assessment & Plan Note (Signed)
Chronic lower back pain, conservative management, will continue Tylenol 500mg  tid, Methocarbamol 250mg  bid, adding Cymbalta 20mg  qd insetting of anxiety/insomnia.

## 2018-04-21 NOTE — Assessment & Plan Note (Signed)
C/o SOB on exertion, chronic edema BLE, hacking cough, continue Torsemide 10mg  qd, obtain CXR to evaluate further.

## 2018-04-21 NOTE — Progress Notes (Signed)
Location:  [facility] Room Number: 29 Place of Service:  SNF (31) Provider:  Marlana Latus  NP  [name], MD  Patient Care Team: [name], MD as PCP - General (Internal Medicine) [name], MD as PCP - Cardiology (Cardiology) [name], MD as Attending Physician (Cardiology) Vevelyn Royals, MD as Consulting Physician (Ophthalmology) Franchot Gallo, MD as Consulting Physician (Urology) Tanda Rockers, MD as Consulting Physician (Pulmonary Disease) Allyn Kenner, MD (Dermatology) Deliah Goody, PA-C as Physician Assistant (Physician Assistant) Lailani Tool X, NP as Nurse Practitioner (Internal Medicine)  Extended Emergency Contact Information Primary Emergency Contact: Neuser,[name] Address: [place], [place] Phone: [phone] Mobile Phone: [phone] Relation: Spouse Secondary Emergency Contact: [name],Coakley  [place] Mobile Phone: [phone] Relation: Daughter  Code Status:  DNR Goals of care: Advanced Directive information Advanced Directives 04/21/2018  Does Patient Have a Medical Advance Directive? Yes  Type of Paramedic of [place];Living will  Does patient want to make changes to medical advance directive? No - Patient declined  Copy of [place] in Chart? Yes - validated most recent copy scanned in chart (See row information)  Would patient like information on creating a medical advance directive? -  Pre-existing out of facility DNR order (yellow form or pink MOST form) -     Chief Complaint  Patient presents with  . Acute Visit    C/o-insomnia    HPI:  Pt is a 85 y.o. male seen today for an acute visit for c/o SOB on exertion, Hx of CHF, on Torsemide 81m qd, trace edema BLE,  hacking cough sometimes, denied chest pain/pressure or palpitation, duration and onset are not certain. The patient stated  sometime he wakes up 2-3 am, not able to return to sleep until breakfast time, prn Alprazolam bid available to him, Hx of Trazodone use, not sure he knows how to request it. Chronic lower back pain, more positional, s/p Ortho evaluation, on Tylenol 5040mtid, Methocarbamol 25056mid.    Past Medical History:  Diagnosis Date  . Arthritis    "minor, back and sometimes knees" (12/15/2012)  . Bradycardia    AFib/SSS s/p St Jude PPM 04/12/2008  . CAD (coronary artery disease) 12/30/2014   CABG (LIMA-LAD, SVG-RCA, SVG-OM in 1996).  07/2009 BMS to SVG-RCA. Cath in 04/2010 with patent stents   . Cardiomyopathy, ischemic 08/25/2012  . CHF (congestive heart failure) ([facility] . Chronic knee pain 12/03/2014  . Combined congestive systolic and diastolic heart failure ([facility]/29/2016   Hx EF 41%. BNP 96.8 02/21/15 Torsemide 04/06/15 Na 142, K 4.6, Bun 16, creat 0.89 04/20/14 BNP 111.7, Na 142, K 4.6, Bun 16, creat 0.9   . Depression with anxiety 02/02/2015   02/21/15 Hgb A1c 5.8 03/10/15 MMSE 30/30   . Dizziness, after diuretic asscoiated with hypotension and responded to fluid bolus 06/05/2011   04/28/15 US Korearotid R+L normal bilateral arterial velocities.    . DMarland Kitchenspnea 08/11/2014   Followed in Pulmonary clinic/ [facility]/ [facility]  - 08/11/2014  Walked RA x 1 laps @ 185 ft each stopped due to fatigue/off balance/ slow pace/  no sob or desat  - PFT's  09/26/2014  FEV1 2.26 (85 % ) ratio 76  p no % improvement from saba with DLCO  67 % corrects to 93 % for alv volume  Since prev study 08/04/13 minimal change lung vol or dlco    . Embolic cerebral infarction ([place]) 12/06/2015  . Exertional shortness of breath    "sometimes walking" (12/15/2012)  . GERD (gastroesophageal reflux disease)   . Gout 02/09/2015  . Heart murmur    "just told I had one today" (12/15/2012)  . Hiatal hernia   . Hyperlipidemia   . Hypertension   . Hypothyroid   . Influenza A 03/10/2016  . Insomnia   . Melanoma of back ([place]) 1976  .  Myocardial infarction [facility]) 1996; 2011   "both silent" (12/15/2012)  . Nonrheumatic aortic valve stenosis   . Orthostatic hypotension   . Osteoporosis, senile   . Pacemaker   . RBBB   . Restless leg 02/02/2015  . Right leg weakness 12/06/2015  . [facility] spotted fever   . S/P CABG x 4   . Sick sinus syndrome ([place]) 01/31/2014  . Sustained ventricular tachycardia ([place]) 07/27/2014  . Urinary retention 12/30/2014   has a cathrater   Past Surgical History:  Procedure Laterality Date  . CARDIAC CATHETERIZATION  04/2010   LIMA to LAD patent,SVG to OM patent,no in-stnet restenosis RCA  . CATARACT EXTRACTION W/ INTRAOCULAR LENS  IMPLANT, BILATERAL Bilateral 2012  . CORONARY ANGIOPLASTY WITH STENT PLACEMENT  07/2009   bare metal stent to SVG to the RCA  . CORONARY ARTERY BYPASS GRAFT  1996   LIMA to LAD,SVG to RCA & SVG to OM  . INSERT / REPLACE / REMOVE PACEMAKER  2010  . MELANOMA EXCISION  05/1974 X2   "taken off my back" (12/15/2012)  . NM MYOVIEW LTD  06/2011   low risk  . PPM GENERATOR CHANGEOUT N/A 01/22/2017   Procedure: PPM GENERATOR CHANGEOUT;  Surgeon: [name], MD;  Location: [facility];  Service: Cardiovascular;  Laterality: N/A;  . TONSILLECTOMY  1938  . TRANSURETHRAL RESECTION OF PROSTATE  1986  . US ECHOCARDIOGRAPHY  07/11/2009   EF 45-50%    Allergies  Allergen Reactions  . Altace [Ramipril] Cough  . Crestor [Rosuvastatin Calcium] Rash  . Penicillins Rash and Other (See Comments)    Has patient had a PCN reaction causing immediate rash, facial/tongue/throat swelling, SOB or lightheadedness with hypotension: Yes Has patient had a PCN reaction causing severe rash involving mucus membranes or skin necrosis: No Has patient had a PCN reaction that required hospitalization No Has patient had a PCN reaction occurring within the last 10 years: No If all of the above answers are "NO", then may proceed with Cephalosporin use.   . Sulfa Antibiotics Rash  .  Sulfamethoxazole-Trimethoprim Rash    Outpatient Encounter Medications as of 04/21/2018  Medication Sig  . acetaminophen (TYLENOL) 500 MG tablet Take 500 mg by mouth 3 (three) times daily.   Marland Kitchen ALPRAZolam (XANAX) 0.25 MG tablet Take 1 tablet (0.25 mg total) by mouth 2 (two) times daily as needed for anxiety.  . Amino Acids-Protein Hydrolys (FEEDING SUPPLEMENT, PRO-STAT SUGAR FREE 64,) LIQD Take 30 mLs by mouth daily.  Marland Kitchen amiodarone (PACERONE) 100 MG tablet Take 1 tablet (100 mg total) by mouth daily.  Marland Kitchen aspirin EC 81 MG tablet Take 1 tablet (81 mg total) by mouth daily.  . calcitonin, salmon, (MIACALCIN/FORTICAL) 200 UNIT/ACT nasal spray 1 spray every other day. To right nostril  . Cholecalciferol (VITAMIN D3) 5000 UNITS CAPS Take 5,000 Units by mouth every Monday.   . clopidogrel (PLAVIX) 75 MG tablet TAKE 1 TABLET ONCE DAILY.  Marland Kitchen  clotrimazole-betamethasone (LOTRISONE) cream Apply 1 application topically daily as needed.  . Droxidopa 100 MG CAPS Take 100 mg by mouth 3 (three) times daily.  . fluticasone (CUTIVATE) 0.05 % cream Apply topically 2 (two) times daily as needed. Apply small amount on  face and back  . guaiFENesin (ROBITUSSIN) 100 MG/5ML SOLN Take 5 mLs (100 mg total) by mouth every 4 (four) hours as needed for cough or to loosen phlegm.  . lactose free nutrition (BOOST) LIQD Take 237 mLs by mouth daily.  Marland Kitchen levothyroxine (SYNTHROID, LEVOTHROID) 100 MCG tablet Take 100 mcg by mouth daily before breakfast.  . meclizine (ANTIVERT) 25 MG tablet Take 1 tablet (25 mg total) by mouth 2 (two) times daily as needed for dizziness.  . Melatonin 3 MG TABS Take 3 mg by mouth at bedtime.  . methocarbamol (ROBAXIN) 500 MG tablet Take 250 mg by mouth 2 (two) times daily.   . midodrine (PROAMATINE) 10 MG tablet Take 1 tablet (10 mg total) by mouth 3 (three) times daily.  . Multiple Vitamins-Minerals (DECUBI-VITE) CAPS Take 1 capsule by mouth daily.  . NON FORMULARY Take 1 Container by mouth daily.  Boost Lactose Reduced  . pantoprazole (PROTONIX) 40 MG tablet TAKE 1 TABLET BY MOUTH TWICE DAILY.  Marland Kitchen Plecanatide (TRULANCE) 3 MG TABS Take 3 mg by mouth daily.  . polyethylene glycol (MIRALAX / GLYCOLAX) packet Take 7 capfuls in 32 ounces of water once.  After that you can take 1-2 capfuls in an 8 ounce glass of water daily.  . pravastatin (PRAVACHOL) 40 MG tablet Take 1 tablet (40 mg total) by mouth at bedtime.  Marland Kitchen rOPINIRole (REQUIP) 0.5 MG tablet TAKE 1 TABLET 3 TIMES A DAY AS NEEDED.  Marland Kitchen sennosides-docusate sodium (SENOKOT-S) 8.6-50 MG tablet Take 2 tablets by mouth daily.   Marland Kitchen torsemide (DEMADEX) 10 MG tablet Take 1 tablet (10 mg total) by mouth daily.  . [DISCONTINUED] Lidocaine 4 % PTCH Apply 1 patch topically daily. Left shoulder, apply to lower back for pain   No facility-administered encounter medications on file as of 04/21/2018.    ROS was provided with assistance of staff, the patient's wife.  Review of Systems  Constitutional: Negative for activity change, appetite change, chills, diaphoresis, fatigue and fever.  HENT: Positive for hearing loss. Negative for congestion and voice change.   Respiratory: Positive for cough and shortness of breath. Negative for chest tightness.   Cardiovascular: Positive for leg swelling. Negative for chest pain and palpitations.  Gastrointestinal: Negative for abdominal distention, abdominal pain, constipation, diarrhea, nausea and vomiting.  Genitourinary: Negative for difficulty urinating, dysuria and urgency.  Musculoskeletal: Positive for arthralgias, back pain and gait problem.  Neurological: Negative for dizziness, facial asymmetry, speech difficulty, weakness and headaches.       Memory lapses.   Psychiatric/Behavioral: Positive for sleep disturbance. Negative for agitation, behavioral problems and hallucinations. The patient is nervous/anxious.     Immunization History  Administered Date(s) Administered  . Influenza, High Dose Seasonal PF  11/01/2014, 11/13/2016  . Influenza,inj,Quad PF,6+ Mos 11/24/2017  . Influenza,inj,quad, With Preservative 11/17/2017  . Influenza-Unspecified 12/05/2013, 11/05/2014, 11/16/2015  . PPD Test 03/07/2014  . Pneumococcal Conjugate-13 10/05/2009  . Pneumococcal Polysaccharide-23 11/05/2005  . Pneumococcal-Unspecified 10/05/2009  . Tdap 08/02/2014  . Zoster 11/05/2005   Pertinent  Health Maintenance Due  Topic Date Due  . PNA vac Low Risk Adult (2 of 2 - PCV13) 10/06/2010  . DEXA SCAN  12/14/2024  . INFLUENZA VACCINE  Completed   Fall  Risk  10/09/2017 10/02/2016 10/30/2015 09/05/2015 02/02/2015  Falls in the past year? _0   Risk for fall due to : - - Impaired balance/gait - -   Functional Status Survey:    Vitals:   04/21/18 1445  BP: 122/68  Pulse: 70  Resp: 18  Temp: 98.4 F (36.9 C)  SpO2: 94%  Weight: 170 lb 1.6 oz (77.2 kg)  Height: _1  (1.778 m)   Body mass index is 24.41 kg/m. Physical Exam Constitutional:      General: He is not in acute distress.    Appearance: Normal appearance. He is not ill-appearing, toxic-appearing or diaphoretic.  HENT:     Head: Normocephalic and atraumatic.     Nose: Nose normal. No congestion or rhinorrhea.     Mouth/Throat:     Mouth: Mucous membranes are moist.  Eyes:     Extraocular Movements: Extraocular movements intact.     Pupils: Pupils are equal, round, and reactive to light.  Neck:     Musculoskeletal: Normal range of motion and neck supple.  Cardiovascular:     Rate and Rhythm: Normal rate and regular rhythm.     Heart sounds: Murmur present.     Comments: Pacemaker.  Pulmonary:     Effort: Pulmonary effort is normal.     Breath sounds: No wheezing, rhonchi or rales.  Abdominal:     General: There is no distension.     Palpations: Abdomen is soft.     Tenderness: There is no abdominal tenderness. There is no guarding or rebound.  Musculoskeletal:     Right lower leg: Edema present.     Left lower leg:  Edema present.     Comments: Trace edema BLE  Skin:    General: Skin is warm and dry.  Neurological:     General: No focal deficit present.     Mental Status: He is alert. Mental status is at baseline.     Cranial Nerves: No cranial nerve deficit.     Motor: No weakness.     Coordination: Coordination normal.     Gait: Gait abnormal.     Comments: Oriented to person and place.   Psychiatric:        Mood and Affect: Mood normal.        Behavior: Behavior normal.     Labs reviewed: Recent Labs    08/26/17 0426 08/27/17 0423  11/25/17 0024  01/15/18 1207 01/18/18 1657 01/19/18  NA 139 142   < > 138   < > 139 139 139  K 4.2 3.8   < > 4.1   < > 4.1 3.8 4.6  CL 107 108   < > 104  --  101 104  --   CO2 25 26   < > 26  --  24 26  --   GLUCOSE 104* 109*   < > 112*  --  100* 127*  --   BUN 13 18   < > 12   < > _2 CREATININE 0.49* 0.57*   < > 0.65   < > 0.75* 0.73 0.7  CALCIUM 8.6* 8.5*   < > 8.6*  --  8.9 8.3*  --   MG 2.2 2.1  --   --   --   --   --   --   PHOS 3.4  --   --   --   --   --   --   --    < > =  values in this interval not displayed.   Recent Labs    10/09/17 11/11/17 1800 12/30/17 01/18/18 1657  AST _0 ALT _1 ALKPHOS 79 75 92 91  BILITOT 0.4 0.3  --  0.5  PROT 6.3 6.3*  --  6.7  ALBUMIN 3.6 2.8*  --  3.2*   Recent Labs    09/04/17 1054  11/11/17 1350 11/17/17 11/25/17 0024 01/18/18 1657  WBC 12.2*   < > 10.3 9.1 11.8* 7.9  NEUTROABS 8.9*  --   --   --  8.8* 5.4  HGB 12.8*   < > 12.4* 11.9* 12.5* 11.6*  HCT 39.2   < > 40.0 35* 39.0 36.9*  MCV 97.3   < > 97.8  --  98.2 98.4  PLT 354   < > 300 312 284 260   < > = values in this interval not displayed.   Lab Results  Component Value Date   TSH 1.125 08/26/2017   Lab Results  Component Value Date   HGBA1C 5.6 12/07/2015   Lab Results  Component Value Date   CHOL 103 05/05/2017   HDL 39 (L) 05/05/2017   LDLCALC 43 05/05/2017   TRIG 129 05/05/2017   CHOLHDL 2.6  05/05/2017    Significant Diagnostic Results in last 30 days:  No results found.  Assessment/Plan Chronic combined systolic (congestive) and diastolic (congestive) heart failure (HCC) C/o SOB on exertion, chronic edema BLE, hacking cough, continue Torsemide 67m qd, obtain CXR to evaluate further.   Osteoarthritis Chronic lower back pain, conservative management, will continue Tylenol 5054mtid, Methocarbamol 25018mid, adding Cymbalta 50m22m insetting of anxiety/insomnia.   Depression with anxiety Early am awake, difficulty returning to asleep, Hx of Trazodone use. Will continue Alprazolam bid prn. Adding Cymbalta 50mg88m Update CBC/diff, CMP/eGFR, Observe.      Family/ staff Communication: plan of care reviewed with the patient, the patient's wife, and charge nurse.   Labs/tests ordered:  CXR/ap, CBC/diff, CMP/eGFR  Time spend 25 minutes.

## 2018-04-28 ENCOUNTER — Telehealth: Payer: Self-pay | Admitting: Cardiovascular Disease

## 2018-04-28 MED ORDER — DROXIDOPA 100 MG PO CAPS: 100.0000 mg | ORAL_CAPSULE | Freq: Three times a day (TID) | ORAL | 6 refills | Status: DC

## 2018-04-28 NOTE — Telephone Encounter (Signed)
Per [facility] notes:  Current doses of [name] and Proamatine seem to provide fair control of dizziness/pre-syncope without hypertension.  Returned call to pt wife  She states that the number for the insurance is [phone], [phone].  Called [facility] s/w rep said that we need to send a new rx then it will take 3-5 days for processing then they will call pt and discuss this further.  Pt wife states that [name](Droxidopa) is a research drug and she does not know the strength at this time. She will find out and CB.  New rx sent to [facility] in chart.

## 2018-04-28 NOTE — Telephone Encounter (Signed)
PT WIFE NOTIFIED NEW RX SENT.

## 2018-04-28 NOTE — Telephone Encounter (Signed)
New Message:    Pt's wife called, she said the manufacturer of Lauree Chandler, wants to know why the patient was put on this medicine please.

## 2018-05-06 LAB — CUP PACEART REMOTE DEVICE CHECK
Battery Remaining Longevity: 113 mo
Battery Remaining Percentage: 95.5 %
Battery Voltage: 2.98 V
Brady Statistic AP VP Percent: 36 %
Brady Statistic AP VS Percent: 63 %
Brady Statistic AS VP Percent: 1 %
Brady Statistic AS VS Percent: 1.1 %
Brady Statistic RA Percent Paced: 97 %
Brady Statistic RV Percent Paced: 36 %
Date Time Interrogation Session: 20200331060013
Implantable Lead Implant Date: 20100309
Implantable Lead Implant Date: 20100309
Implantable Lead Location: 753859
Implantable Lead Location: 753860
Implantable Pulse Generator Implant Date: 20181219
Lead Channel Impedance Value: 410 Ohm
Lead Channel Impedance Value: 510 Ohm
Lead Channel Pacing Threshold Amplitude: 0.625 V
Lead Channel Pacing Threshold Amplitude: 0.75 V
Lead Channel Pacing Threshold Pulse Width: 0.4 ms
Lead Channel Pacing Threshold Pulse Width: 0.4 ms
Lead Channel Sensing Intrinsic Amplitude: 12 mV
Lead Channel Sensing Intrinsic Amplitude: 2.9 mV
Lead Channel Setting Pacing Amplitude: 0.875
Lead Channel Setting Pacing Amplitude: 2.5 V
Lead Channel Setting Pacing Pulse Width: 0.4 ms
Lead Channel Setting Sensing Sensitivity: 2 mV
Pulse Gen Model: 2272
Pulse Gen Serial Number: 8965776

## 2018-05-07 ENCOUNTER — Ambulatory Visit (INDEPENDENT_AMBULATORY_CARE_PROVIDER_SITE_OTHER): Payer: Medicare Other | Admitting: *Deleted

## 2018-05-07 DIAGNOSIS — I495 Sick sinus syndrome: Secondary | ICD-10-CM

## 2018-05-09 LAB — BASIC METABOLIC PANEL
BUN: 18 (ref 4–21)
Creatinine: 0.8 (ref 0.6–1.3)
Glucose: 153
Potassium: 3.6 (ref 3.4–5.3)
Sodium: 138 (ref 137–147)

## 2018-05-09 LAB — CBC AND DIFFERENTIAL
HCT: 33 — AB (ref 41–53)
Hemoglobin: 11.5 — AB (ref 13.5–17.5)
Platelets: 202 (ref 150–399)
WBC: 10.8

## 2018-05-09 LAB — HEPATIC FUNCTION PANEL
ALT: 15 (ref 10–40)
AST: 18 (ref 14–40)

## 2018-05-10 ENCOUNTER — Telehealth: Payer: Self-pay | Admitting: Internal Medicine

## 2018-05-10 NOTE — Telephone Encounter (Signed)
I received call from his Nurse that patient had acute change in his status. He is Leaning on Left side and is not able to walk with the walker with mild assist. They think had TIA or stroke. He is more sleepy and not eating well. No Cough or fever It was d/w the family and they did not want patient to go to ED. Nurses  talked about CT scan and they have decided to keep him in Facility for now. We ordered CMP and CBC which was unremarkable except White count was mildly elevated. He also spiked low grade fever this morning. I have ordered Stat Chest Xray and UA with Cultures Vitals Q4 hours He continues to be Sleepy and not eating well

## 2018-05-11 ENCOUNTER — Encounter: Payer: Self-pay | Admitting: Family

## 2018-05-11 ENCOUNTER — Non-Acute Institutional Stay (SKILLED_NURSING_FACILITY): Payer: Medicare Other | Admitting: Family

## 2018-05-11 DIAGNOSIS — R399 Unspecified symptoms and signs involving the genitourinary system: Secondary | ICD-10-CM

## 2018-05-11 NOTE — Progress Notes (Addendum)
Location:  [facility] Room Number: 29 Place of Service:  SNF [phone]) Provider:Harol Shabazz,NP   [name], MD  Patient Care Team: [name], MD as PCP - General (Internal Medicine) [name], MD as PCP - Cardiology (Cardiology) [name], MD as Attending Physician (Cardiology) Vevelyn Royals, MD as Consulting Physician (Ophthalmology) Franchot Gallo, MD as Consulting Physician (Urology) Tanda Rockers, MD as Consulting Physician (Pulmonary Disease) Allyn Kenner, MD (Dermatology) Deliah Goody, PA-C as Physician Assistant (Physician Assistant) Mast, Man X, NP as Nurse Practitioner (Internal Medicine) Napoleon Monacelli, Nelda Bucks, NP as Nurse Practitioner (Family Medicine)  Extended Emergency Contact Information Primary Emergency Contact: Nelon,[name] Address: [address], [place] Phone: [phone] Mobile Phone: [phone] Relation: Spouse Secondary Emergency Contact: [name],Ade  [place] Mobile Phone: [phone] Relation: Daughter  Code Status: Full Code Goals of care: Advanced Directive information Advanced Directives 05/11/2018  Does Patient Have a Medical Advance Directive? Yes  Type of Paramedic of [place];Living will  Does patient want to make changes to medical advance directive? No - Guardian declined  Copy of [place] in Chart? Yes - validated most recent copy scanned in chart (See row information)  Would patient like information on creating a medical advance directive? -  Pre-existing out of facility DNR order (yellow form or pink MOST form) -     Chief Complaint  Patient presents with  . Acute Visit    abnormal lab results     HPI:  Pt is a 85 y.o. male seen today for an acute visit for evaluation of abnormal labs results.He is seen in his room today with facility Nurse present at bedside.Wife also visited  and waved at patient outside the window due to COVID-19 facility precaution unable to visit.Nurse reports patient was noted to be  lethargic,increased confusion,decreased appetite and Temp 100.6 05/10/2018.patient was unable to ambulate with walker with assist as usual.on call provider was notified stat CBC/diff,CMP,U/A and C/S and portable chest X-ray was ordered.Vital signs every 4 hours with neuro checks also ordered have been stable.Patient up sitting on wheelchair during visit states feeling much better today does not recall what happened over the weekend.He denies any fever,chills,cough,shortness of breath or wheezing.His appetite has improved.  His chest X-ray results showed improved infiltrate in left hilar region as compared to previous X-ray.A large hiatus hernia and pacemaker also present.Urine analysis showed dark yellow urine with turbid appearance,3+ occult blood,Leukocytes 3+ and moderate bacteria.He has indwelling chronic catheter.states continues to have some burning sensation on his penis.CBC results shows WBC 10.8 with Neutrophils 8554 .Urine culture results pending.    Past Medical History:  Diagnosis Date  . Arthritis    "minor, back and sometimes knees" (12/15/2012)  . Bradycardia    AFib/SSS s/p St Jude PPM 04/12/2008  . CAD (coronary artery disease) 12/30/2014   CABG (LIMA-LAD, SVG-RCA, SVG-OM in 1996).  07/2009 BMS to SVG-RCA. Cath in 04/2010 with patent stents   . Cardiomyopathy, ischemic 08/25/2012  . CHF (congestive heart failure) ([place])   . Chronic knee pain 12/03/2014  . Combined congestive systolic and diastolic heart failure ([place]) 02/02/2015   Hx EF 41%. BNP 96.8 02/21/15 Torsemide 04/06/15 Na 142, K 4.6, Bun 16, creat 0.89 04/20/14 BNP 111.7, Na 142, K 4.6, Bun 16, creat 0.9   . Depression with anxiety 02/02/2015   02/21/15 Hgb A1c 5.8 03/10/15  MMSE 30/30   . Dizziness, after diuretic asscoiated with hypotension and responded to fluid bolus 06/05/2011   04/28/15 US carotid R+L  normal bilateral arterial velocities.    Marland Kitchen Dyspnea 08/11/2014   Followed in Pulmonary clinic/ [facility]/ [facility]  - 08/11/2014  Walked RA x 1 laps @ 185 ft each stopped due to fatigue/off balance/ slow pace/  no sob or desat  - PFT's  09/26/2014  FEV1 2.26 (85 % ) ratio 76  p no % improvement from saba with DLCO  67 % corrects to 93 % for alv volume      Since prev study 08/04/13 minimal change lung vol or dlco    . Embolic cerebral infarction ([place]) 12/06/2015  . Exertional shortness of breath    "sometimes walking" (12/15/2012)  . GERD (gastroesophageal reflux disease)   . Gout 02/09/2015  . Heart murmur    "just told I had one today" (12/15/2012)  . Hiatal hernia   . Hyperlipidemia   . Hypertension   . Hypothyroid   . Influenza A 03/10/2016  . Insomnia   . Melanoma of back ([place]) 1976  . Myocardial infarction [facility]) 1996; 2011   "both silent" (12/15/2012)  . Nonrheumatic aortic valve stenosis   . Orthostatic hypotension   . Osteoporosis, senile   . Pacemaker   . RBBB   . Restless leg 02/02/2015  . Right leg weakness 12/06/2015  . [facility] spotted fever   . S/P CABG x 4   . Sick sinus syndrome ([place]) 01/31/2014  . Sustained ventricular tachycardia ([place]) 07/27/2014  . Urinary retention 12/30/2014   has a cathrater   Past Surgical History:  Procedure Laterality Date  . CARDIAC CATHETERIZATION  04/2010   LIMA to LAD patent,SVG to OM patent,no in-stnet restenosis RCA  . CATARACT EXTRACTION W/ INTRAOCULAR LENS  IMPLANT, BILATERAL Bilateral 2012  . CORONARY ANGIOPLASTY WITH STENT PLACEMENT  07/2009   bare metal stent to SVG to the RCA  . CORONARY ARTERY BYPASS GRAFT  1996   LIMA to LAD,SVG to RCA & SVG to OM  . INSERT / REPLACE / REMOVE PACEMAKER  2010  . MELANOMA EXCISION  05/1974 X2   "taken off my back" (12/15/2012)  . NM MYOVIEW LTD  06/2011   low risk  . PPM GENERATOR CHANGEOUT N/A 01/22/2017   Procedure: PPM GENERATOR CHANGEOUT;  Surgeon: [name], MD;  Location: [facility];  Service: Cardiovascular;  Laterality: N/A;  . TONSILLECTOMY  1938  . TRANSURETHRAL RESECTION OF PROSTATE  1986  . US ECHOCARDIOGRAPHY  07/11/2009   EF 45-50%    Allergies  Allergen Reactions  . Altace [Ramipril] Cough  . Crestor [Rosuvastatin Calcium] Rash  . Penicillins Rash and Other (See Comments)    Has patient had a PCN reaction causing immediate rash, facial/tongue/throat swelling, SOB or lightheadedness with hypotension: Yes Has patient had a PCN reaction causing severe rash involving mucus membranes or skin necrosis: No Has patient had a PCN reaction that required hospitalization No Has patient had a PCN reaction occurring within the last 10 years: No If all of the above answers are "NO", then may proceed with Cephalosporin use.   . Sulfa Antibiotics Rash  . Sulfamethoxazole-Trimethoprim Rash    Outpatient Encounter Medications as of 05/11/2018  Medication Sig  . acetaminophen (TYLENOL) 500 MG tablet Take 500 mg by mouth 3 (three) times daily.   Marland Kitchen ALPRAZolam (XANAX) 0.25 MG tablet Take 1 tablet (0.25 mg total) by mouth 2 (  two) times daily as needed for anxiety.  . Amino Acids-Protein Hydrolys (FEEDING SUPPLEMENT, PRO-STAT SUGAR FREE 64,) LIQD Take 30 mLs by mouth daily.  Marland Kitchen amiodarone (PACERONE) 100 MG tablet Take 1 tablet (100 mg total) by mouth daily.  Marland Kitchen aspirin EC 81 MG tablet Take 1 tablet (81 mg total) by mouth daily.  . calcitonin, salmon, (MIACALCIN/FORTICAL) 200 UNIT/ACT nasal spray 1 spray every other day. To right nostril  . Cholecalciferol (VITAMIN D3) 5000 UNITS CAPS Take 5,000 Units by mouth every Monday.   . clopidogrel (PLAVIX) 75 MG tablet TAKE 1 TABLET ONCE DAILY.  . clotrimazole-betamethasone (LOTRISONE) cream Apply 1 application topically daily as needed.  . Droxidopa 100 MG CAPS Take 100 mg by mouth 3 (three) times daily.  . DULoxetine (CYMBALTA) 20 MG capsule Take 20 mg by mouth daily. For anxiety,insomnia and back pain  . fluticasone  (CUTIVATE) 0.05 % cream Apply topically 2 (two) times daily as needed. Apply small amount on  face and back  . guaiFENesin (ROBITUSSIN) 100 MG/5ML SOLN Take 5 mLs (100 mg total) by mouth every 4 (four) hours as needed for cough or to loosen phlegm.  . lactose free nutrition (BOOST) LIQD Take 237 mLs by mouth daily.  Marland Kitchen levothyroxine (SYNTHROID, LEVOTHROID) 100 MCG tablet Take 100 mcg by mouth daily before breakfast.  . meclizine (ANTIVERT) 25 MG tablet Take 1 tablet (25 mg total) by mouth 2 (two) times daily as needed for dizziness.  . Melatonin 3 MG TABS Take 3 mg by mouth at bedtime.  . methocarbamol (ROBAXIN) 500 MG tablet Take 250 mg by mouth 2 (two) times daily.   . midodrine (PROAMATINE) 10 MG tablet Take 1 tablet (10 mg total) by mouth 3 (three) times daily.  . Multiple Vitamins-Minerals (DECUBI-VITE) CAPS Take 1 capsule by mouth daily.  . NON FORMULARY Take 1 Container by mouth daily. Boost Lactose Reduced  . pantoprazole (PROTONIX) 40 MG tablet TAKE 1 TABLET BY MOUTH TWICE DAILY.  Marland Kitchen Plecanatide (TRULANCE) 3 MG TABS Take 3 mg by mouth daily.  . polyethylene glycol (MIRALAX / GLYCOLAX) packet Take 7 capfuls in 32 ounces of water once.  After that you can take 1-2 capfuls in an 8 ounce glass of water daily.  . pravastatin (PRAVACHOL) 40 MG tablet Take 1 tablet (40 mg total) by mouth at bedtime.  Marland Kitchen rOPINIRole (REQUIP) 0.25 MG tablet Take 0.5 mg by mouth 2 (two) times daily.  Marland Kitchen rOPINIRole (REQUIP) 0.5 MG tablet TAKE 1 TABLET 3 TIMES A DAY AS NEEDED. (Patient taking differently: 2 (two) times daily. )  . sennosides-docusate sodium (SENOKOT-S) 8.6-50 MG tablet Take 2 tablets by mouth daily.   Marland Kitchen torsemide (DEMADEX) 10 MG tablet Take 1 tablet (10 mg total) by mouth daily.   No facility-administered encounter medications on file as of 05/11/2018.     Review of Systems  Reason unable to perform ROS: additional information provided by facility Nurse   Constitutional: Negative for chills, fatigue,  fever and unexpected weight change.  HENT: Negative for congestion, rhinorrhea, sinus pressure, sinus pain, sneezing and sore throat.   Eyes: Negative for pain, discharge, redness and itching.  Respiratory: Negative for cough, chest tightness, shortness of breath and wheezing.   Cardiovascular: Negative for chest pain, palpitations and leg swelling.  Gastrointestinal: Negative for abdominal distention, abdominal pain, constipation, diarrhea, nausea and vomiting.  Endocrine: Negative for cold intolerance, heat intolerance, polydipsia, polyphagia and polyuria.  Genitourinary: Negative for flank pain and urgency.  Indwelling foley catheter.reports burning sensation   Musculoskeletal: Positive for gait problem.  Skin: Negative for color change, pallor and rash.  Neurological: Negative for dizziness, light-headedness and headaches.  Hematological: Does not bruise/bleed easily.  Psychiatric/Behavioral: Negative for agitation, confusion and sleep disturbance.    Immunization History  Administered Date(s) Administered  . Influenza, High Dose Seasonal PF 11/01/2014, 11/13/2016  . Influenza,inj,Quad PF,6+ Mos 11/24/2017  . Influenza,inj,quad, With Preservative 11/17/2017  . Influenza-Unspecified 12/05/2013, 11/05/2014, 11/16/2015  . PPD Test 03/07/2014  . Pneumococcal Conjugate-13 10/05/2009  . Pneumococcal Polysaccharide-23 11/05/2005  . Pneumococcal-Unspecified 10/05/2009  . Tdap 08/02/2014  . Zoster 11/05/2005   Pertinent  Health Maintenance Due  Topic Date Due  . PNA vac Low Risk Adult (2 of 2 - PCV13) 10/06/2010  . INFLUENZA VACCINE  09/05/2018  . DEXA SCAN  12/14/2024   Fall Risk  10/09/2017 10/02/2016 10/30/2015 09/05/2015 02/02/2015  Falls in the past year? No No No No No  Risk for fall due to : - - Impaired balance/gait - -   Functional Status Survey:    Vitals:   05/11/18 1108  BP: 124/72  Pulse: 72  Resp: 20  Temp: 98.7 F (37.1 C)  SpO2: 96%  Weight: 170 lb 9.6 oz  (77.4 kg)  Height: 5\' 10"  (1.778 m)   Body mass index is 24.48 kg/m. Physical Exam Vitals signs and nursing note reviewed.  Constitutional:      General: He is not in acute distress.    Appearance: He is not ill-appearing or toxic-appearing.  HENT:     Head: Normocephalic.     Right Ear: Tympanic membrane, ear canal and external ear normal. There is no impacted cerumen.     Left Ear: Tympanic membrane, ear canal and external ear normal. There is no impacted cerumen.     Nose: Nose normal. No congestion or rhinorrhea.     Mouth/Throat:     Mouth: Mucous membranes are moist.     Pharynx: Oropharynx is clear. No oropharyngeal exudate or posterior oropharyngeal erythema.  Eyes:     General: No scleral icterus.       Right eye: No discharge.        Left eye: No discharge.     Conjunctiva/sclera: Conjunctivae normal.     Pupils: Pupils are equal, round, and reactive to light.  Cardiovascular:     Rate and Rhythm: Normal rate and regular rhythm.     Heart sounds: Murmur present. No friction rub. No gallop.   Pulmonary:     Effort: Pulmonary effort is normal. No respiratory distress.     Breath sounds: Normal breath sounds. No wheezing, rhonchi or rales.  Chest:     Chest wall: No tenderness.  Abdominal:     General: Bowel sounds are normal. There is no distension.     Palpations: There is no mass.     Tenderness: There is no abdominal tenderness. There is no right CVA tenderness, left CVA tenderness, guarding or rebound.  Genitourinary:    Comments: Indwelling foley catheter in place draining adequate amuonts of urine amber in color.  Musculoskeletal:        General: No tenderness.     Comments: Moves x 4 extremities on wheelchair.Bilateral knee high ted hose in place.   Skin:    General: Skin is dry.     Coloration: Skin is not pale.     Findings: No erythema or rash.  Neurological:     Mental Status: He is alert. Mental  status is at baseline.     Cranial Nerves: No cranial  nerve deficit.     Sensory: No sensory deficit.     Motor: No weakness.     Coordination: Coordination normal.     Gait: Gait abnormal.  Psychiatric:        Mood and Affect: Mood normal.        Behavior: Behavior normal.        Thought Content: Thought content normal.        Judgment: Judgment normal.     Labs reviewed: Recent Labs    08/26/17 0426 08/27/17 0423  11/25/17 0024  01/15/18 1207 01/18/18 1657 01/19/18 05/09/18  NA 139 142   < > 138   < > 139 139 139 138  K 4.2 3.8   < > 4.1   < > 4.1 3.8 4.6 3.6  CL 107 108   < > 104  --  101 104  --   --   CO2 25 26   < > 26  --  24 26  --   --   GLUCOSE 104* 109*   < > 112*  --  100* 127*  --   --   BUN 13 18   < > 12   < > 15 17 16 18   CREATININE 0.49* 0.57*   < > 0.65   < > 0.75* 0.73 0.7 0.8  CALCIUM 8.6* 8.5*   < > 8.6*  --  8.9 8.3*  --   --   MG 2.2 2.1  --   --   --   --   --   --   --   PHOS 3.4  --   --   --   --   --   --   --   --    < > = values in this interval not displayed.   Recent Labs    10/09/17 11/11/17 1800 12/30/17 01/18/18 1657 05/09/18  AST 13 17 21 20 18   ALT 8 12 12 13 15   ALKPHOS 79 75 92 91  --   BILITOT 0.4 0.3  --  0.5  --   PROT 6.3 6.3*  --  6.7  --   ALBUMIN 3.6 2.8*  --  3.2*  --    Recent Labs    09/04/17 1054  11/11/17 1350  11/25/17 0024 01/18/18 1657 05/09/18  WBC 12.2*   < > 10.3   < > 11.8* 7.9 10.8  NEUTROABS 8.9*  --   --   --  8.8* 5.4  --   HGB 12.8*   < > 12.4*   < > 12.5* 11.6* 11.5*  HCT 39.2   < > 40.0   < > 39.0 36.9* 33*  MCV 97.3   < > 97.8  --  98.2 98.4  --   PLT 354   < > 300   < > 284 260 202   < > = values in this interval not displayed.   Lab Results  Component Value Date   TSH 1.125 08/26/2017   Lab Results  Component Value Date   HGBA1C 5.6 12/07/2015   Lab Results  Component Value Date   CHOL 103 05/05/2017   HDL 39 (L) 05/05/2017   LDLCALC 43 05/05/2017   TRIG 129 05/05/2017   CHOLHDL 2.6 05/05/2017    Significant Diagnostic Results  in last 30 days:  No results found.  Assessment/Plan  Symptoms of urinary tract  infection Afebrile.Feeling much better compared to previous day.U/A results positive for occult blood 3+,Luek3+ and moderate bacteria.Encouraged to increase fluid intake.continue to monitor Temp curve.Awaiting urine culture results.   CAP Afebrile.Infiltriate has improved but not resolved per CXR.No cough,wheezing or shortness of breath. Bilateral lung Breath sounds clear to Auscultation. continue to monitor.    Family/ staff Communication:  Reviewed plan of care with patient and facility Nurse.   Labs/tests ordered: None  Addendum : 05/13/2018  Urine culture results negative for urinary tract infection.patient states doing well no fever or chills reported.Foley catheter changed by facility Nurse per facility protocol.Nurse reports no new concerns.will continue to monitor.

## 2018-05-13 ENCOUNTER — Telehealth: Payer: Self-pay | Admitting: Cardiovascular Disease

## 2018-05-13 ENCOUNTER — Encounter: Payer: Medicare Other | Admitting: Cardiovascular Disease

## 2018-05-13 ENCOUNTER — Non-Acute Institutional Stay (SKILLED_NURSING_FACILITY): Payer: Medicare Other | Admitting: Internal Medicine

## 2018-05-13 ENCOUNTER — Encounter: Payer: Self-pay | Admitting: Internal Medicine

## 2018-05-13 DIAGNOSIS — E039 Hypothyroidism, unspecified: Secondary | ICD-10-CM

## 2018-05-13 DIAGNOSIS — N3001 Acute cystitis with hematuria: Secondary | ICD-10-CM

## 2018-05-13 DIAGNOSIS — Z8673 Personal history of transient ischemic attack (TIA), and cerebral infarction without residual deficits: Secondary | ICD-10-CM | POA: Diagnosis not present

## 2018-05-13 DIAGNOSIS — I951 Orthostatic hypotension: Secondary | ICD-10-CM | POA: Diagnosis not present

## 2018-05-13 DIAGNOSIS — I5042 Chronic combined systolic (congestive) and diastolic (congestive) heart failure: Secondary | ICD-10-CM | POA: Diagnosis not present

## 2018-05-13 NOTE — Telephone Encounter (Signed)
Per pt's wife call she wanted to confirm when this pt should come in for a face to face per note.  Please give her a call she made an August appt.

## 2018-05-13 NOTE — Telephone Encounter (Signed)
Called patient, advised and confirmed upcoming appointment in August.  Patient had no other questions or concerns.

## 2018-05-13 NOTE — Progress Notes (Signed)
Location:  [facility] Room Number: 29 Place of Service:  SNF [phone]) Provider:  Veleta Miners  MD  [name], MD  Patient Care Team: [name], MD as PCP - General (Internal Medicine) [name], MD as PCP - Cardiology (Cardiology) [name], MD as Attending Physician (Cardiology) Vevelyn Royals, MD as Consulting Physician (Ophthalmology) Franchot Gallo, MD as Consulting Physician (Urology) Tanda Rockers, MD as Consulting Physician (Pulmonary Disease) Allyn Kenner, MD (Dermatology) Deliah Goody, PA-C as Physician Assistant (Physician Assistant) Mast, Man X, NP as Nurse Practitioner (Internal Medicine) Ngetich, Nelda Bucks, NP as Nurse Practitioner (Family Medicine)  Extended Emergency Contact Information Primary Emergency Contact: Pricer,[name] Address: [place], [place] Phone: [phone] Mobile Phone: [phone] Relation: Spouse Secondary Emergency Contact: [name],Sw[place]ki  [place] Mobile Phone: [phone] Relation: Daughter  Code Status:  Full Code Goals of care: Advanced Directive information Advanced Directives 05/11/2018  Does Patient Have a Medical Advance Directive? Yes  Type of Paramedic of [place];Living will  Does patient want to make changes to medical advance directive? No - Guardian declined  Copy of [place] in Chart? Yes - validated most recent copy scanned in chart (See row information)  Would patient like information on creating a medical advance directive? -  Pre-existing out of facility DNR order (yellow form or pink MOST form) -     Chief Complaint  Patient presents with  . Acute Visit    C/o- change in mental status    HPI:  Pt is a 85 y.o. male seen today for an acute visit for lethargy and Weakness  Patient is long term resident of facility. He has h/o CAD s/p Bypass surgery with  Extensive Inferior Wall Scarring, Ischemic Cardiomyopathy with systolic heart failure, history of ventricular tachycardia stable on amiodarone, sinus node dysfunction with dual-chamber PPP, severe orthostatic hypotension neurogenic, history of compression fractures, history of ischemic stroke in the past on dual therapy.,  History of chronic indwelling Foley catheter. Patient was seen to have more weakness over the weekend.  Initially there was some worry that he had a TIA/stroke.  Family at this time decided not to send him to ED.  We did blood work and his white count mildly elevated at 10.8.  And then he spiked a temp of 100.6.  He had a chest x-ray done which showed improved left lower lobe infiltrate.  With no other acute changes.  He did have a urine which had more than 50 WBC and nitrates positive. Patient was seen today as he come continues to complain of lower abdominal pain.  Decreased appetite and weakness.  His daughter who talks to him every day thinks that he is more confused. Patient was not treated for with any antibiotics and we will awaiting urine culture.  He has not had any more fever.  He denies any nausea vomiting.  Denies any cough or shortness of breath.   Past Medical History:  Diagnosis Date  . Arthritis    "minor, back and sometimes knees" (12/15/2012)  . Bradycardia    AFib/SSS s/p St Jude PPM 04/12/2008  . CAD (coronary artery disease) 12/30/2014   CABG (LIMA-LAD, SVG-RCA, SVG-OM in 1996).  07/2009 BMS to SVG-RCA. Cath in 04/2010 with patent stents   . Cardiomyopathy, ischemic 08/25/2012  . CHF (congestive heart failure) ([place])   . Chronic knee  pain 12/03/2014  . Combined congestive systolic and diastolic heart failure ([place]) 02/02/2015   Hx EF 41%. BNP 96.8 02/21/15 Torsemide 04/06/15 Na 142, K 4.6, Bun 16, creat 0.89 04/20/14 BNP 111.7, Na 142, K 4.6, Bun 16, creat 0.9   . Depression with anxiety 02/02/2015   02/21/15 Hgb A1c 5.8 03/10/15 MMSE 30/30   . Dizziness, after  diuretic asscoiated with hypotension and responded to fluid bolus 06/05/2011   04/28/15 US carotid R+L normal bilateral arterial velocities.    Marland Kitchen Dyspnea 08/11/2014   Followed in Pulmonary clinic/ [facility]/ [facility]  - 08/11/2014  Walked RA x 1 laps @ 185 ft each stopped due to fatigue/off balance/ slow pace/  no sob or desat  - PFT's  09/26/2014  FEV1 2.26 (85 % ) ratio 76  p no % improvement from saba with DLCO  67 % corrects to 93 % for alv volume      Since prev study 08/04/13 minimal change lung vol or dlco    . Embolic cerebral infarction ([place]) 12/06/2015  . Exertional shortness of breath    "sometimes walking" (12/15/2012)  . GERD (gastroesophageal reflux disease)   . Gout 02/09/2015  . Heart murmur    "just told I had one today" (12/15/2012)  . Hiatal hernia   . Hyperlipidemia   . Hypertension   . Hypothyroid   . Influenza A 03/10/2016  . Insomnia   . Melanoma of back ([place]) 1976  . Myocardial infarction [facility]) 1996; 2011   "both silent" (12/15/2012)  . Nonrheumatic aortic valve stenosis   . Orthostatic hypotension   . Osteoporosis, senile   . Pacemaker   . RBBB   . Restless leg 02/02/2015  . Right leg weakness 12/06/2015  . [facility] spotted fever   . S/P CABG x 4   . Sick sinus syndrome ([place]) 01/31/2014  . Sustained ventricular tachycardia ([place]) 07/27/2014  . Urinary retention 12/30/2014   has a cathrater   Past Surgical History:  Procedure Laterality Date  . CARDIAC CATHETERIZATION  04/2010   LIMA to LAD patent,SVG to OM patent,no in-stnet restenosis RCA  . CATARACT EXTRACTION W/ INTRAOCULAR LENS  IMPLANT, BILATERAL Bilateral 2012  . CORONARY ANGIOPLASTY WITH STENT PLACEMENT  07/2009   bare metal stent to SVG to the RCA  . CORONARY ARTERY BYPASS GRAFT  1996   LIMA to LAD,SVG to RCA & SVG to OM  . INSERT / REPLACE / REMOVE PACEMAKER  2010  . MELANOMA EXCISION  05/1974 X2   "taken off my back" (12/15/2012)  . NM MYOVIEW LTD  06/2011   low risk  . PPM GENERATOR CHANGEOUT  N/A 01/22/2017   Procedure: PPM GENERATOR CHANGEOUT;  Surgeon: [name], MD;  Location: [facility];  Service: Cardiovascular;  Laterality: N/A;  . TONSILLECTOMY  1938  . TRANSURETHRAL RESECTION OF PROSTATE  1986  . US ECHOCARDIOGRAPHY  07/11/2009   EF 45-50%    Allergies  Allergen Reactions  . Altace [Ramipril] Cough  . Crestor [Rosuvastatin Calcium] Rash  . Penicillins Rash and Other (See Comments)    Has patient had a PCN reaction causing immediate rash, facial/tongue/throat swelling, SOB or lightheadedness with hypotension: Yes Has patient had a PCN reaction causing severe rash involving mucus membranes or skin necrosis: No Has patient had a PCN reaction that required hospitalization No Has patient had a PCN reaction occurring within the last 10 years: No If all of the above answers are "NO", then may proceed with Cephalosporin  use.   . Sulfa Antibiotics Rash  . Sulfamethoxazole-Trimethoprim Rash    Outpatient Encounter Medications as of 05/13/2018  Medication Sig  . acetaminophen (TYLENOL) 325 MG tablet Take 650 mg by mouth every 4 (four) hours as needed.  Marland Kitchen acetaminophen (TYLENOL) 500 MG tablet Take 500 mg by mouth 3 (three) times daily.   Marland Kitchen ALPRAZolam (XANAX) 0.25 MG tablet Take 1 tablet (0.25 mg total) by mouth 2 (two) times daily as needed for anxiety.  . Amino Acids-Protein Hydrolys (FEEDING SUPPLEMENT, PRO-STAT SUGAR FREE 64,) LIQD Take 30 mLs by mouth daily.  Marland Kitchen amiodarone (PACERONE) 100 MG tablet Take 1 tablet (100 mg total) by mouth daily.  Marland Kitchen aspirin EC 81 MG tablet Take 1 tablet (81 mg total) by mouth daily.  . Cholecalciferol (VITAMIN D3) 5000 UNITS CAPS Take 5,000 Units by mouth every Monday.   . clopidogrel (PLAVIX) 75 MG tablet TAKE 1 TABLET ONCE DAILY.  . clotrimazole-betamethasone (LOTRISONE) cream Apply 1 application topically daily as needed.  . Droxidopa 100 MG CAPS Take 100 mg by mouth 3 (three) times daily.  . DULoxetine (CYMBALTA) 20 MG capsule  Take 20 mg by mouth daily. For anxiety,insomnia and back pain  . fluticasone (CUTIVATE) 0.05 % cream Apply topically 2 (two) times daily as needed. Apply small amount on  face and back  . guaiFENesin (ROBITUSSIN) 100 MG/5ML SOLN Take 5 mLs (100 mg total) by mouth every 4 (four) hours as needed for cough or to loosen phlegm.  . lactose free nutrition (BOOST) LIQD Take 237 mLs by mouth daily.  Marland Kitchen levothyroxine (SYNTHROID, LEVOTHROID) 100 MCG tablet Take 100 mcg by mouth daily before breakfast.  . meclizine (ANTIVERT) 25 MG tablet Take 1 tablet (25 mg total) by mouth 2 (two) times daily as needed for dizziness.  . Melatonin 3 MG TABS Take 3 mg by mouth at bedtime.  . methocarbamol (ROBAXIN) 500 MG tablet Take 250 mg by mouth 2 (two) times daily.   . midodrine (PROAMATINE) 10 MG tablet Take 1 tablet (10 mg total) by mouth 3 (three) times daily.  . Multiple Vitamins-Minerals (DECUBI-VITE) CAPS Take 1 capsule by mouth daily.  . NON FORMULARY Take 1 Container by mouth daily. Boost Lactose Reduced  . pantoprazole (PROTONIX) 40 MG tablet TAKE 1 TABLET BY MOUTH TWICE DAILY.  Marland Kitchen Plecanatide (TRULANCE) 3 MG TABS Take 3 mg by mouth daily.  . polyethylene glycol (MIRALAX / GLYCOLAX) 17 g packet Take 17 g by mouth daily as needed. Take 1-2 capfuls in 8 oz of H2O  . pravastatin (PRAVACHOL) 40 MG tablet Take 1 tablet (40 mg total) by mouth at bedtime.  Marland Kitchen rOPINIRole (REQUIP) 0.25 MG tablet Take 0.5 mg by mouth 2 (two) times daily.  Marland Kitchen rOPINIRole (REQUIP) 0.5 MG tablet Take 0.5 mg by mouth daily as needed.  . sennosides-docusate sodium (SENOKOT-S) 8.6-50 MG tablet Take 2 tablets by mouth daily.   Marland Kitchen torsemide (DEMADEX) 10 MG tablet Take 1 tablet (10 mg total) by mouth daily.  . [DISCONTINUED] calcitonin, salmon, (MIACALCIN/FORTICAL) 200 UNIT/ACT nasal spray 1 spray every other day. To right nostril  . [DISCONTINUED] polyethylene glycol (MIRALAX / GLYCOLAX) packet Take 7 capfuls in 32 ounces of water once.  After that  you can take 1-2 capfuls in an 8 ounce glass of water daily.  . [DISCONTINUED] rOPINIRole (REQUIP) 0.5 MG tablet TAKE 1 TABLET 3 TIMES A DAY AS NEEDED. (Patient taking differently: 2 (two) times daily. )   No facility-administered encounter medications on file as  of 05/13/2018.     Review of Systems  Constitutional: Positive for activity change and appetite change.  HENT: Negative.   Respiratory: Negative for cough and shortness of breath.   Cardiovascular: Negative.   Gastrointestinal: Positive for abdominal pain and constipation.  Genitourinary: Negative.   Musculoskeletal: Positive for back pain.  Skin: Negative.   Neurological: Positive for dizziness and weakness.  Psychiatric/Behavioral: Negative.     Immunization History  Administered Date(s) Administered  . Influenza, High Dose Seasonal PF 11/01/2014, 11/13/2016  . Influenza,inj,Quad PF,6+ Mos 11/24/2017  . Influenza,inj,quad, With Preservative 11/17/2017  . Influenza-Unspecified 12/05/2013, 11/05/2014, 11/16/2015  . PPD Test 03/07/2014  . Pneumococcal Conjugate-13 10/05/2009  . Pneumococcal Polysaccharide-23 11/05/2005  . Pneumococcal-Unspecified 10/05/2009  . Tdap 08/02/2014  . Zoster 11/05/2005   Pertinent  Health Maintenance Due  Topic Date Due  . PNA vac Low Risk Adult (2 of 2 - PCV13) 10/06/2010  . INFLUENZA VACCINE  09/05/2018  . DEXA SCAN  12/14/2024   Fall Risk  10/09/2017 10/02/2016 10/30/2015 09/05/2015 02/02/2015  Falls in the past year? No No No No No  Risk for fall due to : - - Impaired balance/gait - -   Functional Status Survey:    Vitals:   05/13/18 1125  BP: 106/64  Pulse: 72  Resp: 20  Temp: 98.1 F (36.7 C)  SpO2: 93%  Weight: 170 lb 9.6 oz (77.4 kg)  Height: 5\' 10"  (1.778 m)   Body mass index is 24.48 kg/m. Physical Exam Vitals signs reviewed.  Constitutional:      Appearance: He is normal weight.  HENT:     Head: Normocephalic.     Nose: Nose normal.     Mouth/Throat:     Mouth:  Mucous membranes are moist.     Pharynx: Oropharynx is clear.  Eyes:     Pupils: Pupils are equal, round, and reactive to light.  Neck:     Musculoskeletal: Neck supple.  Cardiovascular:     Rate and Rhythm: Normal rate and regular rhythm.     Heart sounds: Murmur present.  Pulmonary:     Effort: Pulmonary effort is normal. No respiratory distress.     Breath sounds: Normal breath sounds. No wheezing or rales.  Abdominal:     General: Abdomen is flat. Bowel sounds are normal.     Palpations: Abdomen is soft.     Comments: C/O Lower Abdominal Pain  Musculoskeletal:     Comments: Mild swelling Bilateral  Skin:    General: Skin is warm and dry.  Neurological:     Mental Status: He is alert and oriented to person, place, and time.     Comments: Has Mild weakness in Left UE and Lower Extremity. Not sure if this  is new Right side has good strength     Labs reviewed: Recent Labs    08/26/17 0426 08/27/17 0423  11/25/17 0024  01/15/18 1207 01/18/18 1657 01/19/18 05/09/18  NA 139 142   < > 138   < > 139 139 139 138  K 4.2 3.8   < > 4.1   < > 4.1 3.8 4.6 3.6  CL 107 108   < > 104  --  101 104  --   --   CO2 25 26   < > 26  --  24 26  --   --   GLUCOSE 104* 109*   < > 112*  --  100* 127*  --   --   BUN  13 18   < > 12   < > 15 17 16 18   CREATININE 0.49* 0.57*   < > 0.65   < > 0.75* 0.73 0.7 0.8  CALCIUM 8.6* 8.5*   < > 8.6*  --  8.9 8.3*  --   --   MG 2.2 2.1  --   --   --   --   --   --   --   PHOS 3.4  --   --   --   --   --   --   --   --    < > = values in this interval not displayed.   Recent Labs    10/09/17 11/11/17 1800 12/30/17 01/18/18 1657 05/09/18  AST 13 17 21 20 18   ALT 8 12 12 13 15   ALKPHOS 79 75 92 91  --   BILITOT 0.4 0.3  --  0.5  --   PROT 6.3 6.3*  --  6.7  --   ALBUMIN 3.6 2.8*  --  3.2*  --    Recent Labs    09/04/17 1054  11/11/17 1350  11/25/17 0024 01/18/18 1657 05/09/18  WBC 12.2*   < > 10.3   < > 11.8* 7.9 10.8  NEUTROABS 8.9*  --   --    --  8.8* 5.4  --   HGB 12.8*   < > 12.4*   < > 12.5* 11.6* 11.5*  HCT 39.2   < > 40.0   < > 39.0 36.9* 33*  MCV 97.3   < > 97.8  --  98.2 98.4  --   PLT 354   < > 300   < > 284 260 202   < > = values in this interval not displayed.   Lab Results  Component Value Date   TSH 1.125 08/26/2017   Lab Results  Component Value Date   HGBA1C 5.6 12/07/2015   Lab Results  Component Value Date   CHOL 103 05/05/2017   HDL 39 (L) 05/05/2017   LDLCALC 43 05/05/2017   TRIG 129 05/05/2017   CHOLHDL 2.6 05/05/2017    Significant Diagnostic Results in last 30 days:  No results found.  Assessment/Plan UTI with Indwelling cathter Though his Urine culture is negative but his UA was positive for Nitrites and Leucocytes Culture can be negative due to recent treatment with Doxycyline for his Infiltrate And his bacteria in the past has been resistant to Doxycyline Will start him on Cipro 500 mg BID for 7 days Will reculture urine before starting on Antibiotics His previous Urine he has grown Pseudomonas and MRSA. Left-sided weakness I reviewed the notes from neurology from 2 years ago. He had had multiple CAT scans as he has done this before and they all have been negative for any acute processes. He cannot get MRI due to his pacemaker We will continue him on dual therapy We will have therapy evaluate him. History of orthostatic hypotension His weakness can be related to diet.  He does complain of decreased appetite and feeling dizzy.  We will check orthostatic blood pressure x2 He is on Midodrine and Droxidopa Systolic CHF EF of 46-96% Patient's weight is stable.  Continue on Demadex CAD On aspirin Status post PPP Follows with cardiology History of back pain and spasm We will continue low-dose of Robaxin Do not want to add anything new. Also d/w Nurses to use Xanax Cautiously  I have D/W the Daughter. At this  time will continue to monitor him in facility and will let her know if  anything changes  Family/ staff Communication:   Labs/tests ordered:   Total time spent in this patient care encounter was 45_ minutes; greater than 50% of the visit spent counseling patient, reviewing records , Labs and coordinating care for problems addressed at this encounter.

## 2018-05-14 ENCOUNTER — Encounter: Payer: Self-pay | Admitting: Cardiology

## 2018-05-14 NOTE — Progress Notes (Signed)
Remote pacemaker transmission.   

## 2018-05-25 ENCOUNTER — Non-Acute Institutional Stay (SKILLED_NURSING_FACILITY): Payer: Medicare Other | Admitting: Family

## 2018-05-25 ENCOUNTER — Encounter: Payer: Self-pay | Admitting: Family

## 2018-05-25 DIAGNOSIS — K5909 Other constipation: Secondary | ICD-10-CM | POA: Diagnosis not present

## 2018-05-25 DIAGNOSIS — R630 Anorexia: Secondary | ICD-10-CM | POA: Diagnosis not present

## 2018-05-25 NOTE — Progress Notes (Signed)
Location:  [facility] Room Number: 59 Place of Service:  SNF [phone]) Provider:Batu Cassin C,NP   [name], MD  Patient Care Team: [name], MD as PCP - General (Internal Medicine) [name], MD as PCP - Cardiology (Cardiology) [name], MD as Attending Physician (Cardiology) Vevelyn Royals, MD as Consulting Physician (Ophthalmology) Franchot Gallo, MD as Consulting Physician (Urology) Tanda Rockers, MD as Consulting Physician (Pulmonary Disease) Allyn Kenner, MD (Dermatology) Deliah Goody, PA-C as Physician Assistant (Physician Assistant) Mast, Man X, NP as Nurse Practitioner (Internal Medicine) Erek Kowal, Nelda Bucks, NP as Nurse Practitioner (Family Medicine)  Extended Emergency Contact Information Primary Emergency Contact: Gural,[name] Address: [address], [place] Phone: [phone] Mobile Phone: [phone] Relation: Spouse Secondary Emergency Contact: [name],Pritts  [place] Mobile Phone: [phone] Relation: Daughter  Code Status: Full code  Goals of care: Advanced Directive information Advanced Directives 05/25/2018  Does Patient Have a Medical Advance Directive? Yes  Type of Paramedic of [place];Living will  Does patient want to make changes to medical advance directive? No - Patient declined  Copy of [place] in Chart? Yes - validated most recent copy scanned in chart (See row information)  Would patient like information on creating a medical advance directive? -  Pre-existing out of facility DNR order (yellow form or pink MOST form) -     Chief Complaint  Patient presents with  . Acute Visit    abdominal pain,constipation    HPI:  Pt is a 85 y.o. male seen today for an acute visit for abdominal pain and constipation.He is seen in his room today with Nurse present at bedside per patient's  daughter's request.Nurse state patient's daughter called stating patient had abdominal pain during the weekend.patient states " pain is not as bad as it was for the past several days" He states has not had any BM for the past 2-3 days.He denies any abdominal distention,bloating,nausea,vomiting,fever or chills.He states does not have any appetite.Facility Registered dietician reports poor oral intake.He has had 2 pounds weight loss though weight irregularity noted :  wt 169.2 lbs (04/08/2018)  wt 170.1 lbs (04/15/2018)  wt 170.2 lbs (05/06/2018)  wt 167.9 lbs (05/13/2018)  wt 168.0 lbs (05/20/2018)  Past Medical History:  Diagnosis Date  . Arthritis    "minor, back and sometimes knees" (12/15/2012)  . Bradycardia    AFib/SSS s/p St Jude PPM 04/12/2008  . CAD (coronary artery disease) 12/30/2014   CABG (LIMA-LAD, SVG-RCA, SVG-OM in 1996).  07/2009 BMS to SVG-RCA. Cath in 04/2010 with patent stents   . Cardiomyopathy, ischemic 08/25/2012  . CHF (congestive heart failure) ([place])   . Chronic knee pain 12/03/2014  . Combined congestive systolic and diastolic heart failure ([place]) 02/02/2015   Hx EF 41%. BNP 96.8 02/21/15 Torsemide 04/06/15 Na 142, K 4.6, Bun 16, creat 0.89 04/20/14 BNP 111.7, Na 142, K 4.6, Bun 16, creat 0.9   . Depression with anxiety 02/02/2015   02/21/15 Hgb A1c 5.8 03/10/15 MMSE 30/30   . Dizziness, after diuretic asscoiated with hypotension and responded to fluid bolus 06/05/2011   04/28/15 US carotid R+L normal bilateral arterial velocities.    Marland Kitchen Dyspnea 08/11/2014   Followed in Pulmonary clinic/  Healthcare/ [facility]  - 08/11/2014  Walked RA x 1 laps @ 185 ft each stopped due to fatigue/off balance/ slow pace/  no sob  or desat  - PFT's  09/26/2014  FEV1 2.26 (85 % ) ratio 76  p no % improvement from saba with DLCO  67 % corrects to 93 % for alv volume      Since prev study 08/04/13 minimal change lung vol or dlco    . Embolic cerebral infarction ([place]) 12/06/2015  . Exertional shortness of breath     "sometimes walking" (12/15/2012)  . GERD (gastroesophageal reflux disease)   . Gout 02/09/2015  . Heart murmur    "just told I had one today" (12/15/2012)  . Hiatal hernia   . Hyperlipidemia   . Hypertension   . Hypothyroid   . Influenza A 03/10/2016  . Insomnia   . Melanoma of back ([place]) 1976  . Myocardial infarction [facility]) 1996; 2011   "both silent" (12/15/2012)  . Nonrheumatic aortic valve stenosis   . Orthostatic hypotension   . Osteoporosis, senile   . Pacemaker   . RBBB   . Restless leg 02/02/2015  . Right leg weakness 12/06/2015  . [facility] spotted fever   . S/P CABG x 4   . Sick sinus syndrome ([place]) 01/31/2014  . Sustained ventricular tachycardia ([place]) 07/27/2014  . Urinary retention 12/30/2014   has a cathrater   Past Surgical History:  Procedure Laterality Date  . CARDIAC CATHETERIZATION  04/2010   LIMA to LAD patent,SVG to OM patent,no in-stnet restenosis RCA  . CATARACT EXTRACTION W/ INTRAOCULAR LENS  IMPLANT, BILATERAL Bilateral 2012  . CORONARY ANGIOPLASTY WITH STENT PLACEMENT  07/2009   bare metal stent to SVG to the RCA  . CORONARY ARTERY BYPASS GRAFT  1996   LIMA to LAD,SVG to RCA & SVG to OM  . INSERT / REPLACE / REMOVE PACEMAKER  2010  . MELANOMA EXCISION  05/1974 X2   "taken off my back" (12/15/2012)  . NM MYOVIEW LTD  06/2011   low risk  . PPM GENERATOR CHANGEOUT N/A 01/22/2017   Procedure: PPM GENERATOR CHANGEOUT;  Surgeon: [name], MD;  Location: [facility] LAB;  Service: Cardiovascular;  Laterality: N/A;  . TONSILLECTOMY  1938  . TRANSURETHRAL RESECTION OF PROSTATE  1986  . US ECHOCARDIOGRAPHY  07/11/2009   EF 45-50%    Allergies  Allergen Reactions  . Altace [Ramipril] Cough  . Crestor [Rosuvastatin Calcium] Rash  . Penicillins Rash and Other (See Comments)    Has patient had a PCN reaction causing immediate rash, facial/tongue/throat swelling, SOB or lightheadedness with hypotension: Yes Has patient had a PCN reaction causing  severe rash involving mucus membranes or skin necrosis: No Has patient had a PCN reaction that required hospitalization No Has patient had a PCN reaction occurring within the last 10 years: No If all of the above answers are "NO", then may proceed with Cephalosporin use.   . Sulfa Antibiotics Rash  . Sulfamethoxazole-Trimethoprim Rash    Outpatient Encounter Medications as of 05/25/2018  Medication Sig  . acetaminophen (TYLENOL) 500 MG tablet Take 500 mg by mouth 3 (three) times daily.   Marland Kitchen ALPRAZolam (XANAX) 0.25 MG tablet Take 1 tablet (0.25 mg total) by mouth 2 (two) times daily as needed for anxiety.  . Amino Acids-Protein Hydrolys (FEEDING SUPPLEMENT, PRO-STAT SUGAR FREE 64,) LIQD Take 30 mLs by mouth daily.  Marland Kitchen amiodarone (PACERONE) 100 MG tablet Take 1 tablet (100 mg total) by mouth daily.  Marland Kitchen aspirin EC 81 MG tablet Take 1 tablet (81 mg total) by mouth daily.  . bisacodyl (DULCOLAX) 5 MG EC tablet  Take 5 mg by mouth once.  . Cholecalciferol (VITAMIN D3) 5000 UNITS CAPS Take 5,000 Units by mouth every Monday.   . clopidogrel (PLAVIX) 75 MG tablet TAKE 1 TABLET ONCE DAILY.  . clotrimazole-betamethasone (LOTRISONE) cream Apply 1 application topically daily as needed.  . Droxidopa 100 MG CAPS Take 100 mg by mouth 3 (three) times daily.  . DULoxetine (CYMBALTA) 20 MG capsule Take 20 mg by mouth daily. For anxiety,insomnia and back pain  . fluticasone (CUTIVATE) 0.05 % cream Apply topically 2 (two) times daily as needed. Apply small amount on  face and back  . guaiFENesin (ROBITUSSIN) 100 MG/5ML SOLN Take 5 mLs (100 mg total) by mouth every 4 (four) hours as needed for cough or to loosen phlegm.  . lactose free nutrition (BOOST) LIQD Take 237 mLs by mouth daily.  Marland Kitchen levothyroxine (SYNTHROID, LEVOTHROID) 100 MCG tablet Take 100 mcg by mouth daily before breakfast.  . meclizine (ANTIVERT) 25 MG tablet Take 1 tablet (25 mg total) by mouth 2 (two) times daily as needed for dizziness.  . Melatonin  3 MG TABS Take 3 mg by mouth at bedtime.  . methocarbamol (ROBAXIN) 500 MG tablet Take 250 mg by mouth 2 (two) times daily.   . midodrine (PROAMATINE) 10 MG tablet Take 1 tablet (10 mg total) by mouth 3 (three) times daily.  . mirtazapine (REMERON) 7.5 MG tablet Take 7.5 mg by mouth at bedtime.  . Multiple Vitamins-Minerals (DECUBI-VITE) CAPS Take 1 capsule by mouth daily.  . NON FORMULARY Take 1 Container by mouth daily. Boost Lactose Reduced  . pantoprazole (PROTONIX) 40 MG tablet TAKE 1 TABLET BY MOUTH TWICE DAILY.  Marland Kitchen Plecanatide (TRULANCE) 3 MG TABS Take 3 mg by mouth daily.  . polyethylene glycol (MIRALAX / GLYCOLAX) 17 g packet Take 17 g by mouth daily as needed. Take 1-2 capfuls in 8 oz of H2O  . pravastatin (PRAVACHOL) 40 MG tablet Take 1 tablet (40 mg total) by mouth at bedtime.  Marland Kitchen rOPINIRole (REQUIP) 0.25 MG tablet Take 0.5 mg by mouth 2 (two) times daily.  Marland Kitchen rOPINIRole (REQUIP) 0.5 MG tablet Take 0.5 mg by mouth daily as needed.  . sennosides-docusate sodium (SENOKOT-S) 8.6-50 MG tablet Take 2 tablets by mouth daily.   Marland Kitchen torsemide (DEMADEX) 10 MG tablet Take 1 tablet (10 mg total) by mouth daily.  . [DISCONTINUED] acetaminophen (TYLENOL) 325 MG tablet Take 650 mg by mouth every 4 (four) hours as needed.   No facility-administered encounter medications on file as of 05/25/2018.     Review of Systems  Constitutional: Positive for appetite change. Negative for chills, fatigue and fever.       Poor oral intake   HENT: Positive for hearing loss. Negative for congestion, rhinorrhea, sinus pressure, sinus pain, sneezing, sore throat and trouble swallowing.   Eyes: Positive for visual disturbance. Negative for discharge, redness and itching.  Respiratory: Negative for cough, chest tightness, shortness of breath and wheezing.   Cardiovascular: Positive for leg swelling. Negative for chest pain and palpitations.  Gastrointestinal: Positive for constipation. Negative for abdominal  distention, diarrhea, nausea and vomiting.       Generalized abdominal pain was worst over the weekend but now has improved.  Musculoskeletal: Positive for arthralgias and gait problem.  Skin: Negative for color change, pallor and rash.  Psychiatric/Behavioral: Negative for agitation and sleep disturbance. The patient is not nervous/anxious.     Immunization History  Administered Date(s) Administered  . Influenza, High Dose Seasonal PF 11/01/2014, 11/13/2016  .  Influenza,inj,Quad PF,6+ Mos 11/24/2017  . Influenza,inj,quad, With Preservative 11/17/2017  . Influenza-Unspecified 12/05/2013, 11/05/2014, 11/16/2015  . PPD Test 03/07/2014  . Pneumococcal Conjugate-13 10/05/2009  . Pneumococcal Polysaccharide-23 11/05/2005  . Pneumococcal-Unspecified 10/05/2009  . Tdap 08/02/2014  . Zoster 11/05/2005   Pertinent  Health Maintenance Due  Topic Date Due  . PNA vac Low Risk Adult (2 of 2 - PCV13) 10/06/2010  . INFLUENZA VACCINE  09/05/2018  . DEXA SCAN  12/14/2024   Fall Risk  10/09/2017 10/02/2016 10/30/2015 09/05/2015 02/02/2015  Falls in the past year? No No No No No  Risk for fall due to : - - Impaired balance/gait - -    Vitals:   05/25/18 1517  BP: 140/76  Pulse: 76  Resp: 18  Temp: 97.6 F (36.4 C)  SpO2: 96%  Weight: 168 lb (76.2 kg)  Height: 5\' 10"  (1.778 m)   Body mass index is 24.11 kg/m. Physical Exam Vitals signs and nursing note reviewed.  Constitutional:      General: He is not in acute distress.    Appearance: He is normal weight. He is not ill-appearing.  HENT:     Head: Normocephalic.     Nose: No congestion or rhinorrhea.     Mouth/Throat:     Mouth: Mucous membranes are moist.     Pharynx: Oropharynx is clear. No oropharyngeal exudate or posterior oropharyngeal erythema.  Eyes:     General: No scleral icterus.       Right eye: No discharge.        Left eye: No discharge.     Conjunctiva/sclera: Conjunctivae normal.     Pupils: Pupils are equal, round,  and reactive to light.  Neck:     Musculoskeletal: Normal range of motion. No neck rigidity or muscular tenderness.  Cardiovascular:     Rate and Rhythm: Normal rate and regular rhythm.     Pulses: Normal pulses.     Heart sounds: Murmur present. No friction rub. No gallop.   Pulmonary:     Effort: Pulmonary effort is normal. No respiratory distress.     Breath sounds: Normal breath sounds. No wheezing, rhonchi or rales.  Chest:     Chest wall: No tenderness.  Abdominal:     General: Bowel sounds are normal. There is no distension.     Palpations: Abdomen is soft. There is no mass.     Tenderness: There is no abdominal tenderness. There is no right CVA tenderness, left CVA tenderness, guarding or rebound.  Musculoskeletal:        General: No swelling or tenderness.     Comments: Unsteady gait ambulates with walker with assistance spends most time on recliner/wheelchair.bilateral knee high ted hose in place trace edema.  Lymphadenopathy:     Cervical: No cervical adenopathy.  Skin:    General: Skin is warm and dry.     Coloration: Skin is not pale.     Findings: No erythema or rash.  Neurological:     Mental Status: He is alert. Mental status is at baseline.     Cranial Nerves: No cranial nerve deficit.     Motor: No weakness.     Coordination: Coordination normal.     Gait: Gait abnormal.  Psychiatric:        Mood and Affect: Mood normal.        Behavior: Behavior normal.        Thought Content: Thought content normal.        Judgment: Judgment  normal.    Labs reviewed: Recent Labs    08/26/17 0426 08/27/17 0423  11/25/17 0024  01/15/18 1207 01/18/18 1657 01/19/18 05/09/18  NA 139 142   < > 138   < > 139 139 139 138  K 4.2 3.8   < > 4.1   < > 4.1 3.8 4.6 3.6  CL 107 108   < > 104  --  101 104  --   --   CO2 25 26   < > 26  --  24 26  --   --   GLUCOSE 104* 109*   < > 112*  --  100* 127*  --   --   BUN 13 18   < > 12   < > 15 17 16 18   CREATININE 0.49* 0.57*   < >  0.65   < > 0.75* 0.73 0.7 0.8  CALCIUM 8.6* 8.5*   < > 8.6*  --  8.9 8.3*  --   --   MG 2.2 2.1  --   --   --   --   --   --   --   PHOS 3.4  --   --   --   --   --   --   --   --    < > = values in this interval not displayed.   Recent Labs    10/09/17 11/11/17 1800 12/30/17 01/18/18 1657 05/09/18  AST 13 17 21 20 18   ALT 8 12 12 13 15   ALKPHOS 79 75 92 91  --   BILITOT 0.4 0.3  --  0.5  --   PROT 6.3 6.3*  --  6.7  --   ALBUMIN 3.6 2.8*  --  3.2*  --    Recent Labs    09/04/17 1054  11/11/17 1350  11/25/17 0024 01/18/18 1657 05/09/18  WBC 12.2*   < > 10.3   < > 11.8* 7.9 10.8  NEUTROABS 8.9*  --   --   --  8.8* 5.4  --   HGB 12.8*   < > 12.4*   < > 12.5* 11.6* 11.5*  HCT 39.2   < > 40.0   < > 39.0 36.9* 33*  MCV 97.3   < > 97.8  --  98.2 98.4  --   PLT 354   < > 300   < > 284 260 202   < > = values in this interval not displayed.   Lab Results  Component Value Date   TSH 1.125 08/26/2017   Lab Results  Component Value Date   HGBA1C 5.6 12/07/2015   Lab Results  Component Value Date   CHOL 103 05/05/2017   HDL 39 (L) 05/05/2017   LDLCALC 43 05/05/2017   TRIG 129 05/05/2017   CHOLHDL 2.6 05/05/2017    Significant Diagnostic Results in last 30 days:  No results found.  Assessment/Plan 1. Chronic constipation Reports no BM in 2-3 days and generalized abdominal pain which was worst over the weekend but has improved today.Negative exam findings.Miralx 17 gm power given already prior to visit.Add Bisacodyl 5 mg tablet x 1 now.encouraged oral and fluid intake.Continue to monitor.consider fleet enema if sill no BM results.   2. Poor appetite Poor oral intake with fluctuating weight.Add low dose Remeron 7.5 mg tablet daily at bedtime.continue to encourage oral intake.continue to monitor weight and follow up with facility Registered Dietician.  Family/ staff Communication: Reviewed plan of care  with patient and facility Nurse supervisor.   Labs/tests ordered: None

## 2018-06-04 ENCOUNTER — Non-Acute Institutional Stay (SKILLED_NURSING_FACILITY): Payer: Medicare Other | Admitting: Family

## 2018-06-04 ENCOUNTER — Encounter: Payer: Self-pay | Admitting: Family

## 2018-06-04 DIAGNOSIS — E039 Hypothyroidism, unspecified: Secondary | ICD-10-CM

## 2018-06-04 DIAGNOSIS — G2581 Restless legs syndrome: Secondary | ICD-10-CM | POA: Diagnosis not present

## 2018-06-04 DIAGNOSIS — I5042 Chronic combined systolic (congestive) and diastolic (congestive) heart failure: Secondary | ICD-10-CM

## 2018-06-04 DIAGNOSIS — L89152 Pressure ulcer of sacral region, stage 2: Secondary | ICD-10-CM

## 2018-06-04 DIAGNOSIS — E782 Mixed hyperlipidemia: Secondary | ICD-10-CM | POA: Diagnosis not present

## 2018-06-04 NOTE — Progress Notes (Signed)
Location:  [facility] Room Number: 29 Place of Service:  SNF [phone]) Provider: Dinah Ngetich FNP-C   [name], MD  Patient Care Team: [name], MD as PCP - General (Internal Medicine) [name], MD as PCP - Cardiology (Cardiology) [name], MD as Attending Physician (Cardiology) Vevelyn Royals, MD as Consulting Physician (Ophthalmology) Franchot Gallo, MD as Consulting Physician (Urology) Tanda Rockers, MD as Consulting Physician (Pulmonary Disease) Allyn Kenner, MD (Dermatology) Deliah Goody, PA-C as Physician Assistant (Physician Assistant) Mast, Man X, NP as Nurse Practitioner (Internal Medicine) Ngetich, Nelda Bucks, NP as Nurse Practitioner (Family Medicine)  Extended Emergency Contact Information Primary Emergency Contact: Dubray,[name] Address: [address], [place] Phone: [phone] Mobile Phone: [phone] Relation: Spouse Secondary Emergency Contact: [name],Makela  [place] Mobile Phone: [phone] Relation: Daughter  Code Status:  Full Code  Goals of care: Advanced Directive information Advanced Directives 06/04/2018  Does Patient Have a Medical Advance Directive? Yes  Type of Paramedic of [place];Living will  Does patient want to make changes to medical advance directive? No - Patient declined  Copy of [place] in Chart? Yes - validated most recent copy scanned in chart (See row information)  Would patient like information on creating a medical advance directive? -  Pre-existing out of facility DNR order (yellow form or pink MOST form) -     Chief Complaint  Patient presents with  . Medical Management of Chronic Issues    Routine Visit     HPI:  Pt is a 85 y.o. male seen today [place] for medical management of chronic diseases.He is seen in his room today.He states his appetite has  improved for the past two days.He denies any abdominal pain for the past 2-3 days. He continues to walk with physical therapy three times per week for balance  And lower extremities strengthening.He states feels shortness of breath after walking some distance has to sit then proceed.  He has had no recent fall episode.Has had progressive weight loss though recently started on Remeron 7.5 mg tablet daily due to poor oral intake.His appetite has improved.His stage 2 sacral ulcer dressing changes managed facility Nurse.Patient states sacral ulcer painful whenever he sits tries to lie on his sides while in the bed.  Restless leg - states no symptoms on Requip.  Past Medical History:  Diagnosis Date  . Arthritis    "minor, back and sometimes knees" (12/15/2012)  . Bradycardia    AFib/SSS s/p St Jude PPM 04/12/2008  . CAD (coronary artery disease) 12/30/2014   CABG (LIMA-LAD, SVG-RCA, SVG-OM in 1996).  07/2009 BMS to SVG-RCA. Cath in 04/2010 with patent stents   . Cardiomyopathy, ischemic 08/25/2012  . CHF (congestive heart failure) ([place])   . Chronic knee pain 12/03/2014  . Combined congestive systolic and diastolic heart failure ([place]) 02/02/2015   Hx EF 41%. BNP 96.8 02/21/15 Torsemide 04/06/15 Na 142, K 4.6, Bun 16, creat 0.89 04/20/14 BNP 111.7, Na 142, K 4.6, Bun 16, creat 0.9   . Depression with anxiety 02/02/2015   02/21/15 Hgb A1c 5.8 03/10/15 MMSE 30/30   . Dizziness, after diuretic asscoiated with hypotension and responded to fluid bolus 06/05/2011   04/28/15 US carotid R+L normal bilateral arterial velocities.    Marland Kitchen Dyspnea 08/11/2014   Followed in Pulmonary clinic/ [facility]/ [facility]  - 08/11/2014  Walked RA x 1 laps @ 185 ft each stopped due to fatigue/off balance/ slow pace/  no sob or desat  - PFT's  09/26/2014  FEV1 2.26 (85 % ) ratio 76  p no % improvement from saba with DLCO  67 % corrects to 93 % for alv volume      Since prev study 08/04/13 minimal change lung vol or dlco    . Embolic cerebral  infarction ([place]) 12/06/2015  . Exertional shortness of breath    "sometimes walking" (12/15/2012)  . GERD (gastroesophageal reflux disease)   . Gout 02/09/2015  . Heart murmur    "just told I had one today" (12/15/2012)  . Hiatal hernia   . Hyperlipidemia   . Hypertension   . Hypothyroid   . Influenza A 03/10/2016  . Insomnia   . Melanoma of back ([place]) 1976  . Myocardial infarction [facility]) 1996; 2011   "both silent" (12/15/2012)  . Nonrheumatic aortic valve stenosis   . Orthostatic hypotension   . Osteoporosis, senile   . Pacemaker   . RBBB   . Restless leg 02/02/2015  . Right leg weakness 12/06/2015  . [facility] spotted fever   . S/P CABG x 4   . Sick sinus syndrome ([place]) 01/31/2014  . Sustained ventricular tachycardia ([place]) 07/27/2014  . Urinary retention 12/30/2014   has a cathrater   Past Surgical History:  Procedure Laterality Date  . CARDIAC CATHETERIZATION  04/2010   LIMA to LAD patent,SVG to OM patent,no in-stnet restenosis RCA  . CATARACT EXTRACTION W/ INTRAOCULAR LENS  IMPLANT, BILATERAL Bilateral 2012  . CORONARY ANGIOPLASTY WITH STENT PLACEMENT  07/2009   bare metal stent to SVG to the RCA  . CORONARY ARTERY BYPASS GRAFT  1996   LIMA to LAD,SVG to RCA & SVG to OM  . INSERT / REPLACE / REMOVE PACEMAKER  2010  . MELANOMA EXCISION  05/1974 X2   "taken off my back" (12/15/2012)  . NM MYOVIEW LTD  06/2011   low risk  . PPM GENERATOR CHANGEOUT N/A 01/22/2017   Procedure: PPM GENERATOR CHANGEOUT;  Surgeon: [name], MD;  Location: [facility] LAB;  Service: Cardiovascular;  Laterality: N/A;  . TONSILLECTOMY  1938  . TRANSURETHRAL RESECTION OF PROSTATE  1986  . US ECHOCARDIOGRAPHY  07/11/2009   EF 45-50%    Allergies  Allergen Reactions  . Altace [Ramipril] Cough  . Crestor [Rosuvastatin Calcium] Rash  . Penicillins Rash and Other (See Comments)    Has patient had a PCN reaction causing immediate rash, facial/tongue/throat swelling, SOB or  lightheadedness with hypotension: Yes Has patient had a PCN reaction causing severe rash involving mucus membranes or skin necrosis: No Has patient had a PCN reaction that required hospitalization No Has patient had a PCN reaction occurring within the last 10 years: No If all of the above answers are "NO", then may proceed with Cephalosporin use.   . Sulfa Antibiotics Rash  . Sulfamethoxazole-Trimethoprim Rash    Allergies as of 06/04/2018      Reactions   Altace [ramipril] Cough   Crestor [rosuvastatin Calcium] Rash   Penicillins Rash, Other (See Comments)   Has patient had a PCN reaction causing immediate rash, facial/tongue/throat swelling, SOB or lightheadedness with hypotension: Yes Has patient had a PCN reaction causing severe rash involving mucus membranes or skin necrosis: No Has patient had a PCN reaction that required hospitalization No Has patient had a PCN reaction occurring within the last 10 years: No If all  of the above answers are "NO", then may proceed with Cephalosporin use.   Sulfa Antibiotics Rash   Sulfamethoxazole-trimethoprim Rash      Medication List       Accurate as of June 04, 2018 12:44 PM. Always use your most recent med list.        acetaminophen 500 MG tablet Commonly known as:  TYLENOL Take 500 mg by mouth 3 (three) times daily.   ALPRAZolam 0.25 MG tablet Commonly known as:  XANAX Take 1 tablet (0.25 mg total) by mouth 2 (two) times daily as needed for anxiety.   amiodarone 100 MG tablet Commonly known as:  PACERONE Take 1 tablet (100 mg total) by mouth daily.   aspirin EC 81 MG tablet Take 1 tablet (81 mg total) by mouth daily.   bisacodyl 5 MG EC tablet Commonly known as:  DULCOLAX Take 5 mg by mouth once.   clopidogrel 75 MG tablet Commonly known as:  PLAVIX TAKE 1 TABLET ONCE DAILY.   clotrimazole-betamethasone cream Commonly known as:  LOTRISONE Apply 1 application topically daily as needed.   Decubi-Vite Caps Take 1  capsule by mouth daily.   Droxidopa 100 MG Caps Take 100 mg by mouth 3 (three) times daily.   DULoxetine 20 MG capsule Commonly known as:  CYMBALTA Take 20 mg by mouth daily. For anxiety,insomnia and back pain   feeding supplement (PRO-STAT SUGAR FREE 64) Liqd Take 30 mLs by mouth daily.   fluticasone 0.05 % cream Commonly known as:  CUTIVATE Apply topically 2 (two) times daily as needed. Apply small amount on  face and back   guaiFENesin 100 MG/5ML Soln Commonly known as:  ROBITUSSIN Take 5 mLs (100 mg total) by mouth every 4 (four) hours as needed for cough or to loosen phlegm.   lactose free nutrition Liqd Take 237 mLs by mouth daily.   levothyroxine 100 MCG tablet Commonly known as:  SYNTHROID Take 100 mcg by mouth daily before breakfast.   meclizine 25 MG tablet Commonly known as:  ANTIVERT Take 1 tablet (25 mg total) by mouth 2 (two) times daily as needed for dizziness.   Melatonin 3 MG Tabs Take 3 mg by mouth at bedtime.   methocarbamol 500 MG tablet Commonly known as:  ROBAXIN Take 250 mg by mouth 2 (two) times daily.   midodrine 10 MG tablet Commonly known as:  PROAMATINE Take 1 tablet (10 mg total) by mouth 3 (three) times daily.   mirtazapine 7.5 MG tablet Commonly known as:  REMERON Take 7.5 mg by mouth at bedtime.   NON FORMULARY Take 1 Container by mouth daily. Boost Lactose Reduced   pantoprazole 40 MG tablet Commonly known as:  PROTONIX TAKE 1 TABLET BY MOUTH TWICE DAILY.   Plecanatide 3 MG Tabs Commonly known as:  Trulance Take 3 mg by mouth daily.   polyethylene glycol 17 g packet Commonly known as:  MIRALAX / GLYCOLAX Take 17 g by mouth daily as needed. Take 1-2 capfuls in 8 oz of H2O   pravastatin 40 MG tablet Commonly known as:  PRAVACHOL Take 1 tablet (40 mg total) by mouth at bedtime.   rOPINIRole 0.25 MG tablet Commonly known as:  REQUIP Take 0.5 mg by mouth 2 (two) times daily.   rOPINIRole 0.5 MG tablet Commonly known as:   REQUIP Take 0.5 mg by mouth daily as needed.   sennosides-docusate sodium 8.6-50 MG tablet Commonly known as:  SENOKOT-S Take 2 tablets by mouth daily.   torsemide  10 MG tablet Commonly known as:  DEMADEX Take 1 tablet (10 mg total) by mouth daily.   Vitamin D3 125 MCG (5000 UT) Caps Take 5,000 Units by mouth every Monday.       Review of Systems  Constitutional: Positive for unexpected weight change. Negative for chills, fatigue and fever.       Appetite has improved   HENT: Negative for congestion, rhinorrhea, sinus pressure, sinus pain, sneezing and sore throat.   Eyes: Negative for discharge, redness and itching.  Respiratory: Negative for cough, chest tightness and wheezing.        Shortness of breath with exertion   Cardiovascular: Positive for leg swelling. Negative for chest pain and palpitations.  Gastrointestinal: Negative for abdominal distention, abdominal pain, constipation, diarrhea, nausea and vomiting.  Endocrine: Negative for cold intolerance, heat intolerance, polydipsia, polyphagia and polyuria.  Genitourinary: Negative for flank pain, hematuria and urgency.       Indwelling foley catheter   Musculoskeletal: Positive for gait problem.  Skin: Positive for wound. Negative for color change, pallor and rash.       Sacral ulcer dressing changes managed by facility Nurse   Neurological: Negative for dizziness, light-headedness and headaches.  Hematological: Does not bruise/bleed easily.  Psychiatric/Behavioral: Negative for agitation, confusion and sleep disturbance. The patient is not nervous/anxious.    Immunization History  Administered Date(s) Administered  . Influenza, High Dose Seasonal PF 11/01/2014, 11/13/2016  . Influenza,inj,Quad PF,6+ Mos 11/24/2017  . Influenza,inj,quad, With Preservative 11/17/2017  . Influenza-Unspecified 12/05/2013, 11/05/2014, 11/16/2015  . PPD Test 03/07/2014  . Pneumococcal Conjugate-13 10/05/2009  . Pneumococcal  Polysaccharide-23 11/05/2005  . Pneumococcal-Unspecified 10/05/2009  . Tdap 08/02/2014  . Zoster 11/05/2005   Pertinent  Health Maintenance Due  Topic Date Due  . PNA vac Low Risk Adult (2 of 2 - PCV13) 10/06/2010  . INFLUENZA VACCINE  09/05/2018  . DEXA SCAN  12/14/2024   Fall Risk  10/09/2017 10/02/2016 10/30/2015 09/05/2015 02/02/2015  Falls in the past year? No No No No No  Risk for fall due to : - - Impaired balance/gait - -    Vitals:   06/04/18 0909  BP: 138/84  Pulse: 70  Resp: 20  Temp: 97.9 F (36.6 C)  TempSrc: Oral  SpO2: 96%  Weight: 160 lb 6.4 oz (72.8 kg)  Height: 5\' 10"  (1.778 m)   Body mass index is 23.02 kg/m. Physical Exam Vitals signs and nursing note reviewed.  Constitutional:      General: He is not in acute distress.    Appearance: He is normal weight. He is not ill-appearing.  HENT:     Head: Normocephalic.     Right Ear: Tympanic membrane, ear canal and external ear normal. There is no impacted cerumen.     Left Ear: Tympanic membrane, ear canal and external ear normal. There is no impacted cerumen.     Nose: Nose normal. No congestion or rhinorrhea.     Mouth/Throat:     Mouth: Mucous membranes are moist.     Pharynx: Oropharynx is clear. No oropharyngeal exudate or posterior oropharyngeal erythema.  Eyes:     General: No scleral icterus.       Right eye: No discharge.        Left eye: No discharge.     Conjunctiva/sclera: Conjunctivae normal.     Pupils: Pupils are equal, round, and reactive to light.  Neck:     Musculoskeletal: Normal range of motion. No neck rigidity or muscular tenderness.  Cardiovascular:     Rate and Rhythm: Normal rate and regular rhythm.     Heart sounds: Murmur present. No friction rub. No gallop.   Pulmonary:     Effort: Pulmonary effort is normal. No respiratory distress.     Breath sounds: Normal breath sounds. No wheezing, rhonchi or rales.  Chest:     Chest wall: No tenderness.  Abdominal:     General:  Bowel sounds are normal. There is no distension.     Palpations: Abdomen is soft. There is no mass.     Tenderness: There is no abdominal tenderness. There is no right CVA tenderness, left CVA tenderness, guarding or rebound.  Genitourinary:    Comments: Indwelling foley catheter draining adequate amounts of urine.  Musculoskeletal:        General: No tenderness.     Comments: Unsteady gait ambulates with Rolator but spends most time on recliner.bilateral lower extremities trace edema.Knee high ted hose in place.   Lymphadenopathy:     Cervical: No cervical adenopathy.  Skin:    General: Skin is warm and dry.     Coloration: Skin is not pale.     Comments: Sacral area stage 2 ulcer surrounding skin shinny redness,scaly and tender to touch.scaly/redness on gluteal areas/ posterior thigh areas has improved.     Neurological:     Mental Status: He is alert. Mental status is at baseline.     Cranial Nerves: No cranial nerve deficit.     Sensory: No sensory deficit.     Motor: No weakness.     Coordination: Coordination normal.     Gait: Gait abnormal.  Psychiatric:        Mood and Affect: Mood normal.        Speech: Speech normal.        Behavior: Behavior normal.        Thought Content: Thought content normal.        Judgment: Judgment normal.    Labs reviewed: Recent Labs    08/26/17 0426 08/27/17 0423  11/25/17 0024  01/15/18 1207 01/18/18 1657 01/19/18 05/09/18  NA 139 142   < > 138   < > 139 139 139 138  K 4.2 3.8   < > 4.1   < > 4.1 3.8 4.6 3.6  CL 107 108   < > 104  --  101 104  --   --   CO2 25 26   < > 26  --  24 26  --   --   GLUCOSE 104* 109*   < > 112*  --  100* 127*  --   --   BUN 13 18   < > 12   < > 15 17 16 18   CREATININE 0.49* 0.57*   < > 0.65   < > 0.75* 0.73 0.7 0.8  CALCIUM 8.6* 8.5*   < > 8.6*  --  8.9 8.3*  --   --   MG 2.2 2.1  --   --   --   --   --   --   --   PHOS 3.4  --   --   --   --   --   --   --   --    < > = values in this interval not  displayed.   Recent Labs    10/09/17 11/11/17 1800 12/30/17 01/18/18 1657 05/09/18  AST 13 17 21 20 18   ALT 8 12 12  13  15  ALKPHOS 79 75 92 91  --   BILITOT 0.4 0.3  --  0.5  --   PROT 6.3 6.3*  --  6.7  --   ALBUMIN 3.6 2.8*  --  3.2*  --    Recent Labs    09/04/17 1054  11/11/17 1350  11/25/17 0024 01/18/18 1657 05/09/18  WBC 12.2*   < > 10.3   < > 11.8* 7.9 10.8  NEUTROABS 8.9*  --   --   --  8.8* 5.4  --   HGB 12.8*   < > 12.4*   < > 12.5* 11.6* 11.5*  HCT 39.2   < > 40.0   < > 39.0 36.9* 33*  MCV 97.3   < > 97.8  --  98.2 98.4  --   PLT 354   < > 300   < > 284 260 202   < > = values in this interval not displayed.   Lab Results  Component Value Date   TSH 1.125 08/26/2017   Lab Results  Component Value Date   HGBA1C 5.6 12/07/2015   Lab Results  Component Value Date   CHOL 103 05/05/2017   HDL 39 (L) 05/05/2017   LDLCALC 43 05/05/2017   TRIG 129 05/05/2017   CHOLHDL 2.6 05/05/2017    Significant Diagnostic Results in last 30 days:  No results found.  Assessment/Plan 1. Chronic combined systolic and diastolic congestive heart failure (HCC) No signs of fluid overload.continue on torsemide 10 mg tablet daily.continue to follow up with cardiology.  2. Mixed hyperlipidemia LDL at goal.continue on pravastatin 40 mg tablet daily.continue to follow up with cardiology.  3. Hypothyroidism, unspecified type TSH 3.38 (03/30/18).continue on levothyroxine 100 mcg Tablet daily.  4. RLS (restless legs syndrome) ReQuip 0.5 mg twice daily and PRN effective.  5. Pressure ulcer of sacral region, stage 2 (HCC) Afebrile.No signs of infections.surrounding skin with shiny,scaly redness tender to touch.will discontinue foam dressing.continue zinc oxide.Nizoral 2% cream mix Cutivate 0.05% cream twice daily and as needed.Pressure relief cushion in place.Encourage to change position frequently.continue protein supplement.   Family/ staff Communication: Reviewed plan of care  with patient and facility Nurse supervisor   Labs/tests ordered: None   Sandrea Hughs, NP

## 2018-06-18 ENCOUNTER — Non-Acute Institutional Stay (SKILLED_NURSING_FACILITY): Payer: Medicare Other | Admitting: Family

## 2018-06-18 ENCOUNTER — Encounter: Payer: Self-pay | Admitting: Family

## 2018-06-18 DIAGNOSIS — I951 Orthostatic hypotension: Secondary | ICD-10-CM | POA: Diagnosis not present

## 2018-06-18 DIAGNOSIS — I5042 Chronic combined systolic (congestive) and diastolic (congestive) heart failure: Secondary | ICD-10-CM | POA: Diagnosis not present

## 2018-06-18 NOTE — Progress Notes (Signed)
Location:  [facility] Room Number: [place] of Service:  SNF [id number]) Provider: Lyndee Herbst FNP-C  [name], MD  Patient Care Team: [name], MD as PCP - General (Internal Medicine) [name], MD as PCP - Cardiology (Cardiology) [name], MD as Attending Physician (Cardiology) Vevelyn Royals, MD as Consulting Physician (Ophthalmology) Franchot Gallo, MD as Consulting Physician (Urology) Tanda Rockers, MD as Consulting Physician (Pulmonary Disease) Allyn Kenner, MD (Dermatology) Deliah Goody, PA-C as Physician Assistant (Physician Assistant) Mast, Man X, NP as Nurse Practitioner (Internal Medicine) Najai Waszak, Nelda Bucks, NP as Nurse Practitioner (Family Medicine)  Extended Emergency Contact Information Primary Emergency Contact: Lallier,[name] Address: [place], [place] Phone: [phone] Mobile Phone: [phone] Relation: Spouse Secondary Emergency Contact: [name],Mcneel  [place] Mobile Phone: [phone] Relation: Daughter  Code Status:  DNR Goals of care: Advanced Directive information Advanced Directives 06/18/2018  Does Patient Have a Medical Advance Directive? Yes  Type of Paramedic of New Site;Living will  Does patient want to make changes to medical advance directive? No - Patient declined  Copy of [place] in Chart? Yes - validated most recent copy scanned in chart (See row information)  Would patient like information on creating a medical advance directive? -  Pre-existing out of facility DNR order (yellow form or pink MOST form) -     Chief Complaint  Patient presents with   Acute Visit     Blood Pressure meds     HPI:  Pt is a 85 y.o. male seen today at [facility] for an acute visit for evaluation of blood pressure medication.He is seen in his room today sitting on his recliner reading  his newspaper.[place] any acute issues during the visit.His blood pressure reviewed readings ranging in the 100's/50's to 140's/80's with x one reading of 171/85.He is currently on midodrine 10 mg tablet and Droxidopa100 mg capsule   three times daily.Pharmacy request clarification to continue on both medication.    Past Medical History:  Diagnosis Date   Arthritis    "minor, back and sometimes knees" (12/15/2012)   Bradycardia    AFib/SSS s/p St Jude PPM 04/12/2008   CAD (coronary artery disease) 12/30/2014   CABG (LIMA-LAD, SVG-RCA, SVG-OM in 1996).  07/2009 BMS to SVG-RCA. 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 BNP 96.8 02/21/15 Torsemide 04/06/15 Na 142, K 4.6, Bun 16, creat 0.89 04/20/14 BNP 111.7, Na 142, K 4.6, Bun 16, creat 0.9    Depression with anxiety 02/02/2015   02/21/15 Hgb A1c 5.8 03/10/15 MMSE 30/30    Dizziness, after diuretic asscoiated with hypotension and responded to fluid bolus 06/05/2011   04/28/15 US carotid R+L normal bilateral arterial velocities.     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   Exertional shortness of breath    "sometimes walking" (12/15/2012)   GERD (gastroesophageal reflux disease)    Gout 02/09/2015   Heart murmur    "just told I had one today" (12/15/2012)   Hiatal hernia    Hyperlipidemia    Hypertension    Hypothyroid    Influenza A 03/10/2016   Insomnia    Melanoma  of back ([place]) 1976   Myocardial infarction ([place]) 1996; 2011   "both silent" (12/15/2012)   Nonrheumatic aortic valve stenosis    Orthostatic hypotension    Osteoporosis, senile    Pacemaker    RBBB    Restless leg 02/02/2015   Right leg weakness 12/06/2015   [facility] spotted fever    S/P CABG x 4    Sick sinus syndrome ([place]) 01/31/2014   Sustained ventricular tachycardia ([place]) 07/27/2014   Urinary retention 12/30/2014   has a cathrater   Past Surgical History:  Procedure Laterality Date   CARDIAC CATHETERIZATION  04/2010   LIMA to LAD patent,SVG to OM patent,no in-stnet restenosis RCA   CATARACT EXTRACTION W/ INTRAOCULAR LENS  IMPLANT, BILATERAL Bilateral 2012   CORONARY ANGIOPLASTY WITH STENT PLACEMENT  07/2009   bare metal stent to SVG to the RCA   CORONARY ARTERY BYPASS GRAFT  1996   LIMA to LAD,SVG to RCA & SVG to OM   INSERT / REPLACE / REMOVE PACEMAKER  2010   MELANOMA EXCISION  05/1974 X2   "taken off my back" (12/15/2012)   NM MYOVIEW LTD  06/2011   low risk   PPM GENERATOR CHANGEOUT N/A 01/22/2017   Procedure: PPM GENERATOR CHANGEOUT;  Surgeon: [name], MD;  Location: [facility];  Service: Cardiovascular;  Laterality: N/A;   TONSILLECTOMY  1938   TRANSURETHRAL RESECTION OF PROSTATE  1986   US ECHOCARDIOGRAPHY  07/11/2009   EF 45-50%    Allergies  Allergen Reactions   Altace [Ramipril] Cough   Crestor [Rosuvastatin Calcium] Rash   Penicillins Rash and Other (See Comments)    Has patient had a PCN reaction causing immediate rash, facial/tongue/throat swelling, SOB or lightheadedness with hypotension: Yes Has patient had a PCN reaction causing severe rash involving mucus membranes or skin necrosis: No Has patient had a PCN reaction that required hospitalization No Has patient had a PCN reaction occurring within the last 10 years: No If all of the above answers are "NO", then may proceed with Cephalosporin use.    Sulfa  Antibiotics Rash   Sulfamethoxazole-Trimethoprim Rash    Outpatient Encounter Medications as of 06/18/2018  Medication Sig   acetaminophen (TYLENOL) 500 MG tablet Take 500 mg by mouth 3 (three) times daily.    ALPRAZolam (XANAX) 0.25 MG tablet Take 1 tablet (0.25 mg total) by mouth 2 (two) times daily as needed for anxiety.   Amino Acids-Protein Hydrolys (FEEDING SUPPLEMENT, PRO-STAT SUGAR FREE 64,) LIQD Take 30 mLs by mouth daily.   amiodarone (PACERONE) 100 MG tablet Take 1 tablet (100 mg total) by mouth daily.   aspirin EC 81 MG tablet Take 1 tablet (81 mg total) by mouth daily.   Cholecalciferol (VITAMIN D3) 5000 UNITS CAPS Take 5,000 Units by mouth every Monday.    clopidogrel (PLAVIX) 75 MG tablet TAKE 1 TABLET ONCE DAILY.   clotrimazole-betamethasone (LOTRISONE) cream Apply 1 application topically daily as needed.   Droxidopa 100 MG CAPS Take 100 mg  by mouth 3 (three) times daily.   DULoxetine (CYMBALTA) 20 MG capsule Take 20 mg by mouth daily. For anxiety,insomnia and back pain   fluticasone (CUTIVATE) 0.05 % cream Apply topically 2 (two) times daily as needed. Apply small amount on  face and back   guaiFENesin (ROBITUSSIN) 100 MG/5ML SOLN Take 5 mLs (100 mg total) by mouth every 4 (four) hours as needed for cough or to loosen phlegm.   levothyroxine (SYNTHROID, LEVOTHROID) 100 MCG tablet Take 100 mcg by mouth daily before breakfast.   meclizine (ANTIVERT) 25 MG tablet Take 1 tablet (25 mg total) by mouth 2 (two) times daily as needed for dizziness.   Melatonin 3 MG TABS Take 3 mg by mouth at bedtime.   methocarbamol (ROBAXIN) 500 MG tablet Take 250 mg by mouth 2 (two) times daily.    midodrine (PROAMATINE) 10 MG tablet Take 1 tablet (10 mg total) by mouth 3 (three) times daily.   mirtazapine (REMERON) 7.5 MG tablet Take 7.5 mg by mouth at bedtime.   Multiple Vitamins-Minerals (DECUBI-VITE) CAPS Take 1 capsule by mouth daily.   pantoprazole (PROTONIX) 40 MG tablet  TAKE 1 TABLET BY MOUTH TWICE DAILY.   Plecanatide (TRULANCE) 3 MG TABS Take 3 mg by mouth daily.   polyethylene glycol (MIRALAX / GLYCOLAX) 17 g packet Take 17 g by mouth daily as needed. Take 1-2 capfuls in 8 oz of H2O   pravastatin (PRAVACHOL) 40 MG tablet Take 1 tablet (40 mg total) by mouth at bedtime.   rOPINIRole (REQUIP) 0.25 MG tablet Take 0.5 mg by mouth 2 (two) times daily.   rOPINIRole (REQUIP) 0.5 MG tablet Take 0.5 mg by mouth daily as needed.   sennosides-docusate sodium (SENOKOT-S) 8.6-50 MG tablet Take 2 tablets by mouth daily.    torsemide (DEMADEX) 10 MG tablet Take 1 tablet (10 mg total) by mouth daily.   [DISCONTINUED] bisacodyl (DULCOLAX) 5 MG EC tablet Take 5 mg by mouth once.   [DISCONTINUED] lactose free nutrition (BOOST) LIQD Take 237 mLs by mouth daily.   [DISCONTINUED] NON FORMULARY Take 1 Container by mouth daily. Boost Lactose Reduced   No facility-administered encounter medications on file as of 06/18/2018.     Review of Systems  Constitutional: Negative for appetite change, chills, fatigue, fever and unexpected weight change.  HENT: Negative for congestion, rhinorrhea, sinus pressure, sinus pain, sneezing and sore throat.   Eyes: Negative for pain, discharge, redness and itching.  Respiratory: Negative for cough, chest tightness, shortness of breath and wheezing.   Cardiovascular: Positive for leg swelling. Negative for chest pain and palpitations.  Gastrointestinal: Negative for abdominal distention, abdominal pain, constipation, diarrhea, nausea and vomiting.  Genitourinary: Negative for difficulty urinating, dysuria, flank pain and urgency.       Has indwelling foley catheter   Musculoskeletal: Positive for gait problem.  Skin: Negative for color change, pallor and rash.  Neurological: Negative for dizziness, light-headedness and headaches.  Psychiatric/Behavioral: Negative for agitation, confusion and sleep disturbance. The patient is not  nervous/anxious.     Immunization History  Administered Date(s) Administered   Influenza, High Dose Seasonal PF 11/01/2014, 11/13/2016   Influenza,inj,Quad PF,6+ Mos 11/24/2017   Influenza,inj,quad, With Preservative 11/17/2017   Influenza-Unspecified 12/05/2013, 11/05/2014, 11/16/2015   PPD Test 03/07/2014   Pneumococcal Conjugate-13 10/05/2009   Pneumococcal Polysaccharide-23 11/05/2005   Pneumococcal-Unspecified 10/05/2009   Tdap 08/02/2014   Zoster 11/05/2005   Pertinent  Health Maintenance Due  Topic Date Due   PNA vac Low Risk Adult (2  of 2 - PCV13) 10/06/2010   INFLUENZA VACCINE  09/05/2018   DEXA SCAN  12/14/2024   Fall Risk  10/09/2017 10/02/2016 10/30/2015 09/05/2015 02/02/2015  Falls in the past year? No No No No No  Risk for fall due to : - - Impaired balance/gait - -   Functional Status Survey:    Vitals:   06/18/18 1131  BP: 124/78  Pulse: 70  Resp: 20  Temp: 97.9 F (36.6 C)  TempSrc: Oral  SpO2: 93%  Weight: 161 lb 8 oz (73.3 kg)  Height: 5\' 10"  (1.778 m)   Body mass index is 23.17 kg/m. Physical Exam Vitals signs and nursing note reviewed.  Constitutional:      General: He is not in acute distress.    Appearance: He is normal weight. He is not ill-appearing.  HENT:     Head: Normocephalic.     Nose: No congestion or rhinorrhea.     Mouth/Throat:     Mouth: Mucous membranes are moist.     Pharynx: Oropharynx is clear. No oropharyngeal exudate or posterior oropharyngeal erythema.  Eyes:     General: No scleral icterus.       Right eye: No discharge.        Left eye: No discharge.     Conjunctiva/sclera: Conjunctivae normal.     Pupils: Pupils are equal, round, and reactive to light.  Neck:     Musculoskeletal: Neck supple. No neck rigidity or muscular tenderness.  Cardiovascular:     Rate and Rhythm: Normal rate and regular rhythm.     Pulses: Normal pulses.     Heart sounds: Murmur present. No friction rub. No gallop.     Pulmonary:     Effort: Pulmonary effort is normal. No respiratory distress.     Breath sounds: Normal breath sounds. No wheezing, rhonchi or rales.  Chest:     Chest wall: No tenderness.  Abdominal:     General: Bowel sounds are normal. There is no distension.     Palpations: Abdomen is soft. There is no mass.     Tenderness: There is no abdominal tenderness. There is no right CVA tenderness, left CVA tenderness, guarding or rebound.  Musculoskeletal:        General: No swelling or tenderness.     Comments: Unsteady gait ambulates with Rolator with X 1 assist.bilateral lower extremities trace edema.knee high ted hose in place.   Lymphadenopathy:     Cervical: No cervical adenopathy.  Skin:    General: Skin is warm and dry.     Coloration: Skin is not pale.     Findings: No bruising, erythema or rash.     Comments: Sacral/gluteal area skin redness has improved.   Neurological:     Mental Status: He is alert and oriented to person, place, and time.     Cranial Nerves: No cranial nerve deficit.     Sensory: No sensory deficit.     Motor: No weakness.     Coordination: Coordination normal.     Gait: Gait abnormal.  Psychiatric:        Mood and Affect: Mood normal.        Behavior: Behavior normal.        Thought Content: Thought content normal.        Judgment: Judgment normal.     Labs reviewed: Recent Labs    08/26/17 0426 08/27/17 0423  11/25/17 0024  01/15/18 1207 01/18/18 1657 01/19/18 05/09/18  NA 139 142   < >  138   < > 139 139 139 138  K 4.2 3.8   < > 4.1   < > 4.1 3.8 4.6 3.6  CL 107 108   < > 104  --  101 104  --   --   CO2 25 26   < > 26  --  24 26  --   --   GLUCOSE 104* 109*   < > 112*  --  100* 127*  --   --   BUN 13 18   < > 12   < > 15 17 16 18   CREATININE 0.49* 0.57*   < > 0.65   < > 0.75* 0.73 0.7 0.8  CALCIUM 8.6* 8.5*   < > 8.6*  --  8.9 8.3*  --   --   MG 2.2 2.1  --   --   --   --   --   --   --   PHOS 3.4  --   --   --   --   --   --   --   --     < > = values in this interval not displayed.   Recent Labs    10/09/17 11/11/17 1800 12/30/17 01/18/18 1657 05/09/18  AST 13 17 21 20 18   ALT 8 12 12 13 15   ALKPHOS 79 75 92 91  --   BILITOT 0.4 0.3  --  0.5  --   PROT 6.3 6.3*  --  6.7  --   ALBUMIN 3.6 2.8*  --  3.2*  --    Recent Labs    09/04/17 1054  11/11/17 1350  11/25/17 0024 01/18/18 1657 05/09/18  WBC 12.2*   < > 10.3   < > 11.8* 7.9 10.8  NEUTROABS 8.9*  --   --   --  8.8* 5.4  --   HGB 12.8*   < > 12.4*   < > 12.5* 11.6* 11.5*  HCT 39.2   < > 40.0   < > 39.0 36.9* 33*  MCV 97.3   < > 97.8  --  98.2 98.4  --   PLT 354   < > 300   < > 284 260 202   < > = values in this interval not displayed.   Lab Results  Component Value Date   TSH 1.125 08/26/2017   Lab Results  Component Value Date   HGBA1C 5.6 12/07/2015   Lab Results  Component Value Date   CHOL 103 05/05/2017   HDL 39 (L) 05/05/2017   LDLCALC 43 05/05/2017   TRIG 129 05/05/2017   CHOLHDL 2.6 05/05/2017    Significant Diagnostic Results in last 30 days:  No results found.  Assessment/Plan 1. Chronic combined systolic and diastolic congestive heart failure (HCC) No signs of fluid overload except lower extremities trace edema.continue on torsemide 10 mg tablet daily.continue to monitor weight.   2. Orthostatic hypotension Asymptomatic.B/p log reviewed readings stable with x 1 elevated reading of 171/85.will continue on Droxidopa 100 mg capsule one by mouth three times daily.Wean off midodrine from 10 mg tablet three times daily to 5 mg tablet one by mouth three times daily then re-evaluate and wean off if B/p readings remain stable.  Family/ staff Communication: Reviewed plan of care with patient and facility Nurse supervisor  Labs/tests ordered: None   Sandrea Hughs, NP

## 2018-06-24 ENCOUNTER — Encounter: Payer: Self-pay | Admitting: Internal Medicine

## 2018-06-24 ENCOUNTER — Non-Acute Institutional Stay (SKILLED_NURSING_FACILITY): Payer: Medicare Other | Admitting: Internal Medicine

## 2018-06-24 DIAGNOSIS — E039 Hypothyroidism, unspecified: Secondary | ICD-10-CM

## 2018-06-24 DIAGNOSIS — I5042 Chronic combined systolic (congestive) and diastolic (congestive) heart failure: Secondary | ICD-10-CM

## 2018-06-24 DIAGNOSIS — G903 Multi-system degeneration of the autonomic nervous system: Secondary | ICD-10-CM | POA: Diagnosis not present

## 2018-06-24 DIAGNOSIS — G2581 Restless legs syndrome: Secondary | ICD-10-CM

## 2018-06-24 DIAGNOSIS — I2510 Atherosclerotic heart disease of native coronary artery without angina pectoris: Secondary | ICD-10-CM

## 2018-06-24 DIAGNOSIS — L89152 Pressure ulcer of sacral region, stage 2: Secondary | ICD-10-CM

## 2018-06-24 DIAGNOSIS — N319 Neuromuscular dysfunction of bladder, unspecified: Secondary | ICD-10-CM

## 2018-06-24 DIAGNOSIS — E7800 Pure hypercholesterolemia, unspecified: Secondary | ICD-10-CM

## 2018-06-24 NOTE — Progress Notes (Signed)
Location:  [facility] Room Number: 29/A Place of Service:  SNF (31) Provider:Jerilee Space Rene Kocher, MD   [name], MD  Patient Care Team: [name], MD as PCP - General (Internal Medicine) [name], MD as PCP - Cardiology (Cardiology) [name], MD as Attending Physician (Cardiology) Vevelyn Royals, MD as Consulting Physician (Ophthalmology) Franchot Gallo, MD as Consulting Physician (Urology) Tanda Rockers, MD as Consulting Physician (Pulmonary Disease) Allyn Kenner, MD (Dermatology) Deliah Goody, PA-C as Physician Assistant (Physician Assistant) Mast, Man X, NP as Nurse Practitioner (Internal Medicine) Ngetich, Nelda Bucks, NP as Nurse Practitioner (Family Medicine)  Extended Emergency Contact Information Primary Emergency Contact: Supinski,[name] Address: [address]          [place] Phone: [phone] Mobile Phone: [phone] Relation: Spouse Secondary Emergency Contact: [name],Millan  [place] Mobile Phone: [phone] Relation: Daughter  Code Status:DNR Goals of care: Advanced Directive information Advanced Directives 06/24/2018  Does Patient Have a Medical Advance Directive? Yes  Type of Advance Directive [place]  Does patient want to make changes to medical advance directive? No - Patient declined  Copy of [place] in Chart? Yes - validated most recent copy scanned in chart (See row information)  Would patient like information on creating a medical advance directive? -  Pre-existing out of facility DNR order (yellow form or pink MOST form) -     Chief Complaint  Patient presents with  . Medical Management of Chronic Issues    Routine visit     HPI:  Pt is a 85 y.o. male seen today for medical management of chronic diseases.   Patient is long term Resident of facility  He has h/o CAD s/p Bypass surgery with Extensive  Inferior Wall Scarring, Ischemic Cardiomyopathy with systolic heart failure,Moderate AS, history of ventricular tachycardia stable on amiodarone, sinus node dysfunction with dual-chamber PPP, severe orthostatic hypotension neurogenic, history of compression fractures, history of ischemic stroke in the past on dual therapy.,  History of chronic indwelling Foley catheter.  Patient is doing well in facility. His weight has now stabilized since he has been started on Remeron. Appetite has improved. Pressure wound is almost Healed. Today he wanted to know if he Can go back with his [place] to [place]. He is able to walk with the walker for sometime . Still needs help with his ADLS. He did c/o Having some dizziness when he walks with the walker. But no other complains. No New Issues per Nurses   Past Medical History:  Diagnosis Date  . Arthritis    "minor, back and sometimes knees" (12/15/2012)  . Bradycardia    AFib/SSS s/p St Jude PPM 04/12/2008  . CAD (coronary artery disease) 12/30/2014   CABG (LIMA-LAD, SVG-RCA, SVG-OM in 1996).  07/2009 BMS to SVG-RCA. Cath in 04/2010 with patent stents   . Cardiomyopathy, ischemic 08/25/2012  . CHF (congestive heart failure) ([place])   . Chronic knee pain 12/03/2014  . Combined congestive systolic and diastolic heart failure ([place]) 02/02/2015   Hx EF 41%. BNP 96.8 02/21/15 Torsemide 04/06/15 Na 142, K 4.6, Bun 16, creat 0.89 04/20/14 BNP 111.7, Na 142, K 4.6, Bun 16, creat 0.9   . Depression with anxiety 02/02/2015   02/21/15 Hgb A1c 5.8 03/10/15 MMSE 30/30   . Dizziness, after diuretic asscoiated with hypotension and responded to fluid bolus 06/05/2011   04/28/15 US carotid R+L normal bilateral arterial velocities.    Marland Kitchen  Dyspnea 08/11/2014   Followed in Pulmonary clinic/ [facility]/ [facility]  - 08/11/2014  Walked RA x 1 laps @ 185 ft each stopped due to fatigue/off balance/ slow pace/  no sob or desat  - PFT's  09/26/2014  FEV1 2.26 (85 % ) ratio 76  p no % improvement from saba  with DLCO  67 % corrects to 93 % for alv volume      Since prev study 08/04/13 minimal change lung vol or dlco    . Embolic cerebral infarction ([place]) 12/06/2015  . Exertional shortness of breath    "sometimes walking" (12/15/2012)  . GERD (gastroesophageal reflux disease)   . Gout 02/09/2015  . Heart murmur    "just told I had one today" (12/15/2012)  . Hiatal hernia   . Hyperlipidemia   . Hypertension   . Hypothyroid   . Influenza A 03/10/2016  . Insomnia   . Melanoma of back ([place]) 1976  . Myocardial infarction [facility]) 1996; 2011   "both silent" (12/15/2012)  . Nonrheumatic aortic valve stenosis   . Orthostatic hypotension   . Osteoporosis, senile   . Pacemaker   . RBBB   . Restless leg 02/02/2015  . Right leg weakness 12/06/2015  . [facility] spotted fever   . S/P CABG x 4   . Sick sinus syndrome ([place]) 01/31/2014  . Sustained ventricular tachycardia ([place]) 07/27/2014  . Urinary retention 12/30/2014   has a cathrater   Past Surgical History:  Procedure Laterality Date  . CARDIAC CATHETERIZATION  04/2010   LIMA to LAD patent,SVG to OM patent,no in-stnet restenosis RCA  . CATARACT EXTRACTION W/ INTRAOCULAR LENS  IMPLANT, BILATERAL Bilateral 2012  . CORONARY ANGIOPLASTY WITH STENT PLACEMENT  07/2009   bare metal stent to SVG to the RCA  . CORONARY ARTERY BYPASS GRAFT  1996   LIMA to LAD,SVG to RCA & SVG to OM  . INSERT / REPLACE / REMOVE PACEMAKER  2010  . MELANOMA EXCISION  05/1974 X2   "taken off my back" (12/15/2012)  . NM MYOVIEW LTD  06/2011   low risk  . PPM GENERATOR CHANGEOUT N/A 01/22/2017   Procedure: PPM GENERATOR CHANGEOUT;  Surgeon: [name], MD;  Location: [facility];  Service: Cardiovascular;  Laterality: N/A;  . TONSILLECTOMY  1938  . TRANSURETHRAL RESECTION OF PROSTATE  1986  . US ECHOCARDIOGRAPHY  07/11/2009   EF 45-50%    Allergies  Allergen Reactions  . Altace [Ramipril] Cough  . Crestor [Rosuvastatin Calcium] Rash  . Penicillins Rash and  Other (See Comments)    Has patient had a PCN reaction causing immediate rash, facial/tongue/throat swelling, SOB or lightheadedness with hypotension: Yes Has patient had a PCN reaction causing severe rash involving mucus membranes or skin necrosis: No Has patient had a PCN reaction that required hospitalization No Has patient had a PCN reaction occurring within the last 10 years: No If all of the above answers are "NO", then may proceed with Cephalosporin use.   . Sulfa Antibiotics Rash  . Sulfamethoxazole-Trimethoprim Rash    Outpatient Encounter Medications as of 06/24/2018  Medication Sig  . acetaminophen (TYLENOL) 500 MG tablet Take 500 mg by mouth 3 (three) times daily.   Marland Kitchen ALPRAZolam (XANAX) 0.25 MG tablet Take 1 tablet (0.25 mg total) by mouth 2 (two) times daily as needed for anxiety.  Marland Kitchen amiodarone (PACERONE) 100 MG tablet Take 1 tablet (100 mg total) by mouth daily.  Marland Kitchen aspirin EC 81 MG tablet Take  1 tablet (81 mg total) by mouth daily.  . Cholecalciferol (VITAMIN D3) 5000 UNITS CAPS Take 5,000 Units by mouth every Monday.   . clopidogrel (PLAVIX) 75 MG tablet TAKE 1 TABLET ONCE DAILY.  . clotrimazole-betamethasone (LOTRISONE) cream Apply 1 application topically daily as needed.  . Droxidopa 100 MG CAPS Take 100 mg by mouth 3 (three) times daily.  . DULoxetine (CYMBALTA) 20 MG capsule Take 20 mg by mouth daily. For anxiety,insomnia and back pain  . fluticasone (CUTIVATE) 0.05 % cream Apply topically 2 (two) times daily as needed. Apply small amount on  face and back  . guaiFENesin (ROBITUSSIN) 100 MG/5ML SOLN Take 5 mLs (100 mg total) by mouth every 4 (four) hours as needed for cough or to loosen phlegm.  Marland Kitchen levothyroxine (SYNTHROID, LEVOTHROID) 100 MCG tablet Take 100 mcg by mouth daily before breakfast.  . meclizine (ANTIVERT) 25 MG tablet Take 1 tablet (25 mg total) by mouth 2 (two) times daily as needed for dizziness.  . Melatonin 3 MG TABS Take 3 mg by mouth at bedtime.  .  methocarbamol (ROBAXIN) 500 MG tablet Take 250 mg by mouth 2 (two) times daily.   . midodrine (PROAMATINE) 10 MG tablet Take 1 tablet (10 mg total) by mouth 3 (three) times daily.  . mirtazapine (REMERON) 7.5 MG tablet Take 7.5 mg by mouth at bedtime.  . Multiple Vitamins-Minerals (DECUBI-VITE) CAPS Take 1 capsule by mouth daily.  . pantoprazole (PROTONIX) 40 MG tablet TAKE 1 TABLET BY MOUTH TWICE DAILY.  Marland Kitchen Plecanatide (TRULANCE) 3 MG TABS Take 3 mg by mouth daily.  . polyethylene glycol (MIRALAX / GLYCOLAX) 17 g packet Take 17 g by mouth daily as needed. Take 1-2 capfuls in 8 oz of H2O  . pravastatin (PRAVACHOL) 40 MG tablet Take 1 tablet (40 mg total) by mouth at bedtime.  Marland Kitchen rOPINIRole (REQUIP) 0.25 MG tablet Take 0.5 mg by mouth 2 (two) times daily.  Marland Kitchen rOPINIRole (REQUIP) 0.5 MG tablet Take 0.5 mg by mouth daily as needed.  . sennosides-docusate sodium (SENOKOT-S) 8.6-50 MG tablet Take 2 tablets by mouth daily.   Marland Kitchen torsemide (DEMADEX) 10 MG tablet Take 1 tablet (10 mg total) by mouth daily.  . Amino Acids-Protein Hydrolys (FEEDING SUPPLEMENT, PRO-STAT SUGAR FREE 64,) LIQD Take 30 mLs by mouth 2 (two) times a day.    No facility-administered encounter medications on file as of 06/24/2018.     Review of Systems  HENT: Negative.   Respiratory: Positive for cough and shortness of breath.   Cardiovascular: Negative.   Gastrointestinal: Negative.   Genitourinary: Negative.   Musculoskeletal: Negative.   Skin: Negative.   Neurological: Positive for dizziness and weakness.  Psychiatric/Behavioral: Negative.     Immunization History  Administered Date(s) Administered  . Influenza, High Dose Seasonal PF 11/01/2014, 11/13/2016  . Influenza,inj,Quad PF,6+ Mos 11/24/2017  . Influenza,inj,quad, With Preservative 11/17/2017  . Influenza-Unspecified 12/05/2013, 11/05/2014, 11/16/2015  . PPD Test 03/07/2014  . Pneumococcal Conjugate-13 10/05/2009  . Pneumococcal Polysaccharide-23 11/05/2005  .  Pneumococcal-Unspecified 10/05/2009  . Tdap 08/02/2014  . Zoster 11/05/2005   Pertinent  Health Maintenance Due  Topic Date Due  . PNA vac Low Risk Adult (2 of 2 - PCV13) 10/06/2010  . INFLUENZA VACCINE  09/05/2018  . DEXA SCAN  12/14/2024   Fall Risk  10/09/2017 10/02/2016 10/30/2015 09/05/2015 02/02/2015  Falls in the past year? No No No No No  Risk for fall due to : - - Impaired balance/gait - -  Functional Status Survey:    Vitals:   06/24/18 0933  BP: 136/82  Pulse: 71  Resp: 20  Temp: 98.1 F (36.7 C)  SpO2: 93%  Weight: 161 lb 8 oz (73.3 kg)  Height: 5\' 10"  (1.778 m)   Body mass index is 23.17 kg/m. Physical Exam Vitals signs reviewed.  Constitutional:      Appearance: Normal appearance.  HENT:     Head: Normocephalic.     Nose: Nose normal.     Mouth/Throat:     Mouth: Mucous membranes are moist.     Pharynx: Oropharynx is clear.  Eyes:     Pupils: Pupils are equal, round, and reactive to light.  Cardiovascular:     Rate and Rhythm: Normal rate and regular rhythm.     Heart sounds: Murmur present.  Pulmonary:     Effort: Pulmonary effort is normal. No respiratory distress.     Breath sounds: Normal breath sounds. No wheezing or rales.  Abdominal:     General: Abdomen is flat. Bowel sounds are normal. There is no distension.     Palpations: Abdomen is soft.     Tenderness: There is no abdominal tenderness. There is no guarding.  Musculoskeletal:     Comments: Mild Swelling Bilateral  Skin:    General: Skin is warm and dry.  Neurological:     Mental Status: He is alert and oriented to person, place, and time.     Comments: Did not notice any Weakness In Left side today Cannot raise Left Arm due to shoulder pain  Psychiatric:        Mood and Affect: Mood normal.        Thought Content: Thought content normal.        Judgment: Judgment normal.     Labs reviewed: Recent Labs    08/26/17 0426 08/27/17 0423  11/25/17 0024  01/15/18 1207 01/18/18  1657 01/19/18 05/09/18  NA 139 142   < > 138   < > 139 139 139 138  K 4.2 3.8   < > 4.1   < > 4.1 3.8 4.6 3.6  CL 107 108   < > 104  --  101 104  --   --   CO2 25 26   < > 26  --  24 26  --   --   GLUCOSE 104* 109*   < > 112*  --  100* 127*  --   --   BUN 13 18   < > 12   < > 15 17 16 18   CREATININE 0.49* 0.57*   < > 0.65   < > 0.75* 0.73 0.7 0.8  CALCIUM 8.6* 8.5*   < > 8.6*  --  8.9 8.3*  --   --   MG 2.2 2.1  --   --   --   --   --   --   --   PHOS 3.4  --   --   --   --   --   --   --   --    < > = values in this interval not displayed.   Recent Labs    10/09/17 11/11/17 1800 12/30/17 01/18/18 1657 05/09/18  AST 13 17 21 20 18   ALT 8 12 12 13 15   ALKPHOS 79 75 92 91  --   BILITOT 0.4 0.3  --  0.5  --   PROT 6.3 6.3*  --  6.7  --  ALBUMIN 3.6 2.8*  --  3.2*  --    Recent Labs    09/04/17 1054  11/11/17 1350  11/25/17 0024 01/18/18 1657 05/09/18  WBC 12.2*   < > 10.3   < > 11.8* 7.9 10.8  NEUTROABS 8.9*  --   --   --  8.8* 5.4  --   HGB 12.8*   < > 12.4*   < > 12.5* 11.6* 11.5*  HCT 39.2   < > 40.0   < > 39.0 36.9* 33*  MCV 97.3   < > 97.8  --  98.2 98.4  --   PLT 354   < > 300   < > 284 260 202   < > = values in this interval not displayed.   Lab Results  Component Value Date   TSH 1.125 08/26/2017   Lab Results  Component Value Date   HGBA1C 5.6 12/07/2015   Lab Results  Component Value Date   CHOL 103 05/05/2017   HDL 39 (L) 05/05/2017   LDLCALC 43 05/05/2017   TRIG 129 05/05/2017   CHOLHDL 2.6 05/05/2017    Significant Diagnostic Results in last 30 days:  No results found.  Assessment/Plan Neurogenic orthostatic hypotension  On Droxidopa and Midodrine His Dose of Midodrine was decrased recently due to resting BP being high Will Continue both for now and not reduce the dose  as he is very high risk for postural  Hypotension  CAD On Aspirin And Plavix On Statin S/P PPP On Low dose of Amiodarone Follows with Cardiology  Chronic combined  systolic and diastolic congestive heart failure  On Demadex  Hypothyroidism,  On Synthyroid TSH Normal in 02/20  Decubitus ulcer of sacral region, stage 2  Almost resolved Per Nurses. Was unable to see it today  Hyperlipidemia On statin  Neurogenic bladder Chronic Foley  RLS (restless legs syndrome) On Requip Depression with weight loss Is doing well on Remeron and Cymbalta Will not incrase the dose right now Continue Xanax also Pressure Ulcer Stage 2 Almost resolved   Family/ staff Communication:   Labs/tests ordered:   Total time spent in this patient care encounter was  25_  minutes; greater than 50% of the visit spent counseling patient and staff, reviewing records , Labs and coordinating care for problems addressed at this encounter.

## 2018-07-16 ENCOUNTER — Non-Acute Institutional Stay (SKILLED_NURSING_FACILITY): Payer: Medicare Other | Admitting: Family

## 2018-07-16 ENCOUNTER — Encounter: Payer: Self-pay | Admitting: Family

## 2018-07-16 DIAGNOSIS — E039 Hypothyroidism, unspecified: Secondary | ICD-10-CM

## 2018-07-16 DIAGNOSIS — E7800 Pure hypercholesterolemia, unspecified: Secondary | ICD-10-CM | POA: Diagnosis not present

## 2018-07-16 DIAGNOSIS — I5042 Chronic combined systolic (congestive) and diastolic (congestive) heart failure: Secondary | ICD-10-CM | POA: Diagnosis not present

## 2018-07-16 DIAGNOSIS — F418 Other specified anxiety disorders: Secondary | ICD-10-CM | POA: Diagnosis not present

## 2018-07-16 NOTE — Progress Notes (Signed)
Location:  [facility] Room Number: [place] of Service:  SNF [phone]) Provider: Glendola Friedhoff FNP-C   [name], MD  Patient Care Team: [name], MD as PCP - General (Internal Medicine) [name], MD as PCP - Cardiology (Cardiology) [name], MD as Attending Physician (Cardiology) Vevelyn Royals, MD as Consulting Physician (Ophthalmology) Franchot Gallo, MD as Consulting Physician (Urology) Tanda Rockers, MD as Consulting Physician (Pulmonary Disease) Allyn Kenner, MD (Dermatology) Deliah Goody, PA-C as Physician Assistant (Physician Assistant) Mast, Man X, NP as Nurse Practitioner (Internal Medicine) Shronda Boeh, Nelda Bucks, NP as Nurse Practitioner (Family Medicine)  Extended Emergency Contact Information Primary Emergency Contact: Blumenthal,[name] Address: [address], [place] Phone: [phone] Mobile Phone: [phone] Relation: Spouse Secondary Emergency Contact: [name],Gregori  [place] Mobile Phone: [phone] Relation: Daughter  Code Status:  Full Code   Goals of care: Advanced Directive information Advanced Directives 07/16/2018  Does Patient Have a Medical Advance Directive? Yes  Type of Paramedic of [place];Living will  Does patient want to make changes to medical advance directive? No - Patient declined  Copy of [place] in Chart? Yes - validated most recent copy scanned in chart (See row information)  Would patient like information on creating a medical advance directive? -  Pre-existing out of facility DNR order (yellow form or pink MOST form) -     Chief Complaint  Patient presents with  . Medical Management of Chronic Issues    Routine Visit     HPI:  Pt is a 85 y.o. male seen today [facility] for medical management of chronic diseases.He has a medical history of CAD,combined congestive  systolic and diastolic heart failure,orthostatic hypotension,Hyperlipidemia,SSS,Hx TIA,Osteoarthritis,urnary retention has chronic indwelling foley catheter,RLS,Depression with anxiety among other conditions.He is seen in his room today.He denies any acute issues.he states has been walking daily on facility hallway using his walker.No recent fall episodes.He states appetite has improved on Remeron.Has gained 5.9 lbs over one month.Previous sacral ulcer has resolved.    Past Medical History:  Diagnosis Date  . Arthritis    "minor, back and sometimes knees" (12/15/2012)  . Bradycardia    AFib/SSS s/p St Jude PPM 04/12/2008  . CAD (coronary artery disease) 12/30/2014   CABG (LIMA-LAD, SVG-RCA, SVG-OM in 1996).  07/2009 BMS to SVG-RCA. Cath in 04/2010 with patent stents   . Cardiomyopathy, ischemic 08/25/2012  . CHF (congestive heart failure) ([place])   . Chronic knee pain 12/03/2014  . Combined congestive systolic and diastolic heart failure ([place]) 02/02/2015   Hx EF 41%. BNP 96.8 02/21/15 Torsemide 04/06/15 Na 142, K 4.6, Bun 16, creat 0.89 04/20/14 BNP 111.7, Na 142, K 4.6, Bun 16, creat 0.9   . Depression with anxiety 02/02/2015   02/21/15 Hgb A1c 5.8 03/10/15 MMSE 30/30   . Dizziness, after diuretic asscoiated with hypotension and responded to fluid bolus 06/05/2011   04/28/15 US carotid R+L normal bilateral arterial velocities.    Marland Kitchen Dyspnea 08/11/2014   Followed in Pulmonary clinic/ [facility]/ [facility]  - 08/11/2014  Walked RA x 1 laps @ 185 ft each stopped due to fatigue/off balance/ slow pace/  no sob or desat  - PFT's  09/26/2014  FEV1 2.26 (85 % ) ratio 76  p no % improvement from saba with DLCO  67 % corrects to 93 % for alv volume  Since prev study 08/04/13 minimal change lung vol or dlco    . Embolic cerebral infarction ([facility]) 12/06/2015  . Exertional shortness of breath    "sometimes walking" (12/15/2012)  . GERD (gastroesophageal reflux disease)   . Gout 02/09/2015  . Heart murmur    "just told I  had one today" (12/15/2012)  . Hiatal hernia   . Hyperlipidemia   . Hypertension   . Hypothyroid   . Influenza A 03/10/2016  . Insomnia   . Melanoma of back ([place]) 1976  . Myocardial infarction [facility]) 1996; 2011   "both silent" (12/15/2012)  . Nonrheumatic aortic valve stenosis   . Orthostatic hypotension   . Osteoporosis, senile   . Pacemaker   . RBBB   . Restless leg 02/02/2015  . Right leg weakness 12/06/2015  . [facility] spotted fever   . S/P CABG x 4   . Sick sinus syndrome ([place]) 01/31/2014  . Sustained ventricular tachycardia ([place]) 07/27/2014  . Urinary retention 12/30/2014   has a cathrater   Past Surgical History:  Procedure Laterality Date  . CARDIAC CATHETERIZATION  04/2010   LIMA to LAD patent,SVG to OM patent,no in-stnet restenosis RCA  . CATARACT EXTRACTION W/ INTRAOCULAR LENS  IMPLANT, BILATERAL Bilateral 2012  . CORONARY ANGIOPLASTY WITH STENT PLACEMENT  07/2009   bare metal stent to SVG to the RCA  . CORONARY ARTERY BYPASS GRAFT  1996   LIMA to LAD,SVG to RCA & SVG to OM  . INSERT / REPLACE / REMOVE PACEMAKER  2010  . MELANOMA EXCISION  05/1974 X2   "taken off my back" (12/15/2012)  . NM MYOVIEW LTD  06/2011   low risk  . PPM GENERATOR CHANGEOUT N/A 01/22/2017   Procedure: PPM GENERATOR CHANGEOUT;  Surgeon: [name], MD;  Location: [facility];  Service: Cardiovascular;  Laterality: N/A;  . TONSILLECTOMY  1938  . TRANSURETHRAL RESECTION OF PROSTATE  1986  . US ECHOCARDIOGRAPHY  07/11/2009   EF 45-50%    Allergies  Allergen Reactions  . Altace [Ramipril] Cough  . Crestor [Rosuvastatin Calcium] Rash  . Penicillins Rash and Other (See Comments)    Has patient had a PCN reaction causing immediate rash, facial/tongue/throat swelling, SOB or lightheadedness with hypotension: Yes Has patient had a PCN reaction causing severe rash involving mucus membranes or skin necrosis: No Has patient had a PCN reaction that required hospitalization No Has  patient had a PCN reaction occurring within the last 10 years: No If all of the above answers are "NO", then may proceed with Cephalosporin use.   . Sulfa Antibiotics Rash  . Sulfamethoxazole-Trimethoprim Rash    Allergies as of 07/16/2018      Reactions   Altace [ramipril] Cough   Crestor [rosuvastatin Calcium] Rash   Penicillins Rash, Other (See Comments)   Has patient had a PCN reaction causing immediate rash, facial/tongue/throat swelling, SOB or lightheadedness with hypotension: Yes Has patient had a PCN reaction causing severe rash involving mucus membranes or skin necrosis: No Has patient had a PCN reaction that required hospitalization No Has patient had a PCN reaction occurring within the last 10 years: No If all of the above answers are "NO", then may proceed with Cephalosporin use.   Sulfa Antibiotics Rash   Sulfamethoxazole-trimethoprim Rash      Medication List       Accurate as of July 16, 2018  1:22 PM. If you have any questions, ask your nurse or doctor.  STOP taking these medications   ALPRAZolam 0.25 MG tablet Commonly known as: XANAX Stopped by: Sandrea Hughs, NP     TAKE these medications   acetaminophen 500 MG tablet Commonly known as: TYLENOL Take 500 mg by mouth 3 (three) times daily.   amiodarone 100 MG tablet Commonly known as: PACERONE Take 1 tablet (100 mg total) by mouth daily.   aspirin EC 81 MG tablet Take 1 tablet (81 mg total) by mouth daily.   clopidogrel 75 MG tablet Commonly known as: PLAVIX TAKE 1 TABLET ONCE DAILY.   clotrimazole-betamethasone cream Commonly known as: LOTRISONE Apply 1 application topically daily as needed.   Decubi-Vite Caps Take 1 capsule by mouth daily.   Droxidopa 100 MG Caps Take 100 mg by mouth 3 (three) times daily.   DULoxetine 20 MG capsule Commonly known as: CYMBALTA Take 20 mg by mouth daily. For anxiety,insomnia and back pain   feeding supplement (PRO-STAT SUGAR FREE 64) Liqd Take  30 mLs by mouth 2 (two) times a day.   fluticasone 0.05 % cream Commonly known as: CUTIVATE Apply topically 2 (two) times daily. Apply small amount on  face and back also can apply as needed   guaiFENesin 100 MG/5ML Soln Commonly known as: ROBITUSSIN Take 5 mLs (100 mg total) by mouth every 4 (four) hours as needed for cough or to loosen phlegm.   levothyroxine 100 MCG tablet Commonly known as: SYNTHROID Take 100 mcg by mouth daily before breakfast.   meclizine 25 MG tablet Commonly known as: ANTIVERT Take 1 tablet (25 mg total) by mouth 2 (two) times daily as needed for dizziness.   Melatonin 3 MG Tabs Take 3 mg by mouth at bedtime.   methocarbamol 500 MG tablet Commonly known as: ROBAXIN Take 250 mg by mouth 2 (two) times daily.   midodrine 10 MG tablet Commonly known as: PROAMATINE Take 1 tablet (10 mg total) by mouth 3 (three) times daily.   mirtazapine 7.5 MG tablet Commonly known as: REMERON Take 7.5 mg by mouth at bedtime.   pantoprazole 40 MG tablet Commonly known as: PROTONIX TAKE 1 TABLET BY MOUTH TWICE DAILY.   Plecanatide 3 MG Tabs Commonly known as: Trulance Take 3 mg by mouth daily.   polyethylene glycol 17 g packet Commonly known as: MIRALAX / GLYCOLAX Take 17 g by mouth daily as needed. Take 1-2 capfuls in 8 oz of H2O   pravastatin 40 MG tablet Commonly known as: PRAVACHOL Take 1 tablet (40 mg total) by mouth at bedtime.   rOPINIRole 0.25 MG tablet Commonly known as: REQUIP Take 0.5 mg by mouth 2 (two) times daily.   rOPINIRole 0.5 MG tablet Commonly known as: REQUIP Take 0.5 mg by mouth daily as needed.   sennosides-docusate sodium 8.6-50 MG tablet Commonly known as: SENOKOT-S Take 2 tablets by mouth daily.   torsemide 10 MG tablet Commonly known as: DEMADEX Take 1 tablet (10 mg total) by mouth daily.   Vitamin D3 125 MCG (5000 UT) Caps Take 5,000 Units by mouth every Monday.       Review of Systems  Constitutional: Negative for  appetite change, chills and fatigue.       Has gained 5.9 lbs over one month.Had previous weight loss   HENT: Positive for hearing loss. Negative for congestion, rhinorrhea, sinus pressure, sinus pain, sneezing and sore throat.   Eyes: Positive for visual disturbance. Negative for discharge, redness and itching.       Wears eye glasses  Respiratory: Negative for cough, chest tightness, shortness of breath and wheezing.   Cardiovascular: Positive for leg swelling. Negative for chest pain and palpitations.  Gastrointestinal: Negative for abdominal distention, abdominal pain, constipation, diarrhea, nausea and vomiting.  Endocrine: Negative for cold intolerance, heat intolerance, polydipsia, polyphagia and polyuria.  Genitourinary: Negative for difficulty urinating, dysuria, flank pain and urgency.       Has indwelling foley catheter   Musculoskeletal: Positive for gait problem.  Skin: Negative for color change, pallor, rash and wound.  Neurological: Negative for light-headedness, numbness and headaches.       Dizziness has improved   Hematological: Does not bruise/bleed easily.  Psychiatric/Behavioral: Negative for agitation, confusion, dysphoric mood and sleep disturbance. The patient is not nervous/anxious.     Immunization History  Administered Date(s) Administered  . Influenza, High Dose Seasonal PF 11/01/2014, 11/13/2016  . Influenza,inj,Quad PF,6+ Mos 11/24/2017  . Influenza,inj,quad, With Preservative 11/17/2017  . Influenza-Unspecified 12/05/2013, 11/05/2014, 11/16/2015  . PPD Test 03/07/2014  . Pneumococcal Conjugate-13 10/05/2009  . Pneumococcal Polysaccharide-23 11/05/2005  . Pneumococcal-Unspecified 10/05/2009  . Tdap 08/02/2014  . Zoster 11/05/2005   Pertinent  Health Maintenance Due  Topic Date Due  . PNA vac Low Risk Adult (2 of 2 - PCV13) 10/06/2010  . INFLUENZA VACCINE  09/05/2018  . DEXA SCAN  12/14/2024   Fall Risk  10/09/2017 10/02/2016 10/30/2015 09/05/2015  02/02/2015  Falls in the past year? No No No No No  Risk for fall due to : - - Impaired balance/gait - -    Vitals:   07/16/18 1104  BP: 123/72  Pulse: 74  Resp: 20  Temp: 98.1 F (36.7 C)  TempSrc: Oral  SpO2: 93%  Weight: 167 lb 6.4 oz (75.9 kg)  Height: 5\' 10"  (1.778 m)   Body mass index is 24.02 kg/m. Physical Exam Vitals signs and nursing note reviewed.  Constitutional:      General: He is not in acute distress.    Appearance: He is not ill-appearing.  HENT:     Head: Normocephalic.     Right Ear: Tympanic membrane, ear canal and external ear normal. There is no impacted cerumen.     Left Ear: Tympanic membrane, ear canal and external ear normal. There is no impacted cerumen.     Nose: Nose normal. No congestion or rhinorrhea.     Mouth/Throat:     Mouth: Mucous membranes are moist.     Pharynx: Oropharynx is clear. No oropharyngeal exudate or posterior oropharyngeal erythema.  Eyes:     General: No scleral icterus.       Right eye: No discharge.        Left eye: No discharge.     Extraocular Movements: Extraocular movements intact.     Conjunctiva/sclera: Conjunctivae normal.     Pupils: Pupils are equal, round, and reactive to light.  Neck:     Musculoskeletal: Normal range of motion. No neck rigidity or muscular tenderness.     Vascular: No carotid bruit.  Cardiovascular:     Rate and Rhythm: Normal rate and regular rhythm.     Pulses: Normal pulses.     Heart sounds: Murmur present. No gallop.   Pulmonary:     Effort: Pulmonary effort is normal. No respiratory distress.     Breath sounds: Normal breath sounds. No wheezing, rhonchi or rales.  Chest:     Chest wall: No tenderness.  Abdominal:     General: Abdomen is flat. Bowel sounds are normal. There is  no distension.     Palpations: There is no mass.     Tenderness: There is no abdominal tenderness. There is no right CVA tenderness, left CVA tenderness, guarding or rebound.  Musculoskeletal: Normal  range of motion.        General: No swelling or tenderness.     Comments: Unsteady gait ambulates with walker.Bilateral lower extremities trace -1+ edema.Knee high ted hose in place.   Lymphadenopathy:     Cervical: No cervical adenopathy.  Skin:    General: Skin is warm and dry.     Coloration: Skin is not pale.     Findings: No bruising, erythema or rash.  Neurological:     Mental Status: He is alert and oriented to person, place, and time.     Cranial Nerves: No cranial nerve deficit.     Motor: No weakness.     Gait: Gait abnormal.  Psychiatric:        Mood and Affect: Mood normal.        Speech: Speech normal.        Behavior: Behavior normal.        Thought Content: Thought content normal.        Judgment: Judgment normal.     Labs reviewed: Recent Labs    08/26/17 0426 08/27/17 0423  11/25/17 0024  01/15/18 1207 01/18/18 1657 01/19/18 05/09/18  NA 139 142   < > 138   < > 139 139 139 138  K 4.2 3.8   < > 4.1   < > 4.1 3.8 4.6 3.6  CL 107 108   < > 104  --  101 104  --   --   CO2 25 26   < > 26  --  24 26  --   --   GLUCOSE 104* 109*   < > 112*  --  100* 127*  --   --   BUN 13 18   < > 12   < > 15 17 16 18   CREATININE 0.49* 0.57*   < > 0.65   < > 0.75* 0.73 0.7 0.8  CALCIUM 8.6* 8.5*   < > 8.6*  --  8.9 8.3*  --   --   MG 2.2 2.1  --   --   --   --   --   --   --   PHOS 3.4  --   --   --   --   --   --   --   --    < > = values in this interval not displayed.   Recent Labs    10/09/17 11/11/17 1800 12/30/17 01/18/18 1657 05/09/18  AST 13 17 21 20 18   ALT 8 12 12 13 15   ALKPHOS 79 75 92 91  --   BILITOT 0.4 0.3  --  0.5  --   PROT 6.3 6.3*  --  6.7  --   ALBUMIN 3.6 2.8*  --  3.2*  --    Recent Labs    09/04/17 1054  11/11/17 1350  11/25/17 0024 01/18/18 1657 05/09/18  WBC 12.2*   < > 10.3   < > 11.8* 7.9 10.8  NEUTROABS 8.9*  --   --   --  8.8* 5.4  --   HGB 12.8*   < > 12.4*   < > 12.5* 11.6* 11.5*  HCT 39.2   < > 40.0   < > 39.0 36.9* 33*  MCV  97.3   < > 97.8  --  98.2 98.4  --   PLT 354   < > 300   < > 284 260 202   < > = values in this interval not displayed.   Lab Results  Component Value Date   TSH 1.125 08/26/2017   Lab Results  Component Value Date   HGBA1C 5.6 12/07/2015   Lab Results  Component Value Date   CHOL 103 05/05/2017   HDL 39 (L) 05/05/2017   LDLCALC 43 05/05/2017   TRIG 129 05/05/2017   CHOLHDL 2.6 05/05/2017    Significant Diagnostic Results in last 30 days:  No results found.  Assessment/Plan 1. Chronic combined systolic and diastolic congestive heart failure (HCC) Trace -1+ edema to BLE compression stockings in place.No cough,shortness of breath or wheezing noted.Lungs CTA.continue on torsemide 10 mg tablet daily.encouraged to keep legs elevated while seated.continue to monitor weight.   2. Hypothyroidism, unspecified type TSH 3.38 (03/30/2018).continue on levothyroxine 100 mcg tablet daily.  3. Depression with anxiety Mood stable on Remeron 7.5 mg tablet,melatonin 3 mg tablet  and Cymbalta 20 mg capsule daily.   4. Hypercholesterolemia LDL at goal (03/30/2018)The patient has a history of TIA.continue on Pravachol 40 mg tablet daily at bedtime.   Family/ staff Communication: Reviewed plan of care with patient and facility Nurse supervisor   Labs/tests ordered: None   Sandrea Hughs, NP

## 2018-08-03 LAB — NOVEL CORONAVIRUS, NAA: SARS-CoV-2, NAA: NOT DETECTED

## 2018-08-06 ENCOUNTER — Ambulatory Visit (INDEPENDENT_AMBULATORY_CARE_PROVIDER_SITE_OTHER): Payer: Medicare Other | Admitting: *Deleted

## 2018-08-06 ENCOUNTER — Telehealth: Payer: Self-pay

## 2018-08-06 DIAGNOSIS — I495 Sick sinus syndrome: Secondary | ICD-10-CM

## 2018-08-06 LAB — CUP PACEART REMOTE DEVICE CHECK
Battery Remaining Longevity: 113 mo
Battery Remaining Percentage: 95.5 %
Battery Voltage: 2.98 V
Brady Statistic AP VP Percent: 35 %
Brady Statistic AP VS Percent: 64 %
Brady Statistic AS VP Percent: 1 %
Brady Statistic AS VS Percent: 1.3 %
Brady Statistic RA Percent Paced: 97 %
Brady Statistic RV Percent Paced: 35 %
Date Time Interrogation Session: 20200702111000
Implantable Lead Implant Date: 20100309
Implantable Lead Implant Date: 20100309
Implantable Lead Location: 753859
Implantable Lead Location: 753860
Implantable Pulse Generator Implant Date: 20181219
Lead Channel Impedance Value: 400 Ohm
Lead Channel Impedance Value: 510 Ohm
Lead Channel Pacing Threshold Amplitude: 0.625 V
Lead Channel Pacing Threshold Pulse Width: 0.4 ms
Lead Channel Sensing Intrinsic Amplitude: 11.3 mV
Lead Channel Sensing Intrinsic Amplitude: 2.9 mV
Lead Channel Setting Pacing Amplitude: 0.875
Lead Channel Setting Pacing Amplitude: 2.5 V
Lead Channel Setting Pacing Pulse Width: 0.4 ms
Lead Channel Setting Sensing Sensitivity: 2 mV
Pulse Gen Model: 2272
Pulse Gen Serial Number: 8965776

## 2018-08-06 NOTE — Telephone Encounter (Signed)
Transmission received 08-06-2018 at 10:36 am.

## 2018-08-06 NOTE — Telephone Encounter (Signed)
Left message for patient to remind of missed remote transmission.  

## 2018-08-06 NOTE — Telephone Encounter (Addendum)
Patient's daughter called she stated her father is in a nursing home they are in the process of locating the box to do the transmission.  They may not do the transmission until some point over the weekend.

## 2018-08-13 ENCOUNTER — Encounter: Payer: Self-pay | Admitting: Cardiology

## 2018-08-13 NOTE — Progress Notes (Signed)
Remote pacemaker transmission.   

## 2018-08-26 ENCOUNTER — Non-Acute Institutional Stay (SKILLED_NURSING_FACILITY): Payer: Medicare Other | Admitting: Family

## 2018-08-26 ENCOUNTER — Encounter: Payer: Self-pay | Admitting: Family

## 2018-08-26 DIAGNOSIS — I951 Orthostatic hypotension: Secondary | ICD-10-CM

## 2018-08-26 DIAGNOSIS — R0982 Postnasal drip: Secondary | ICD-10-CM

## 2018-08-26 DIAGNOSIS — I5042 Chronic combined systolic (congestive) and diastolic (congestive) heart failure: Secondary | ICD-10-CM | POA: Diagnosis not present

## 2018-08-26 DIAGNOSIS — E039 Hypothyroidism, unspecified: Secondary | ICD-10-CM | POA: Diagnosis not present

## 2018-08-26 DIAGNOSIS — G2581 Restless legs syndrome: Secondary | ICD-10-CM

## 2018-08-26 DIAGNOSIS — F418 Other specified anxiety disorders: Secondary | ICD-10-CM

## 2018-08-26 NOTE — Progress Notes (Signed)
Location:  [facility] Room Number: [place] of Service:  SNF [phone]) Provider: Kuuipo Anzaldo FNP-C   [name], MD  Patient Care Team: [name], MD as PCP - General (Internal Medicine) [name], MD as PCP - Cardiology (Cardiology) [name], MD as Attending Physician (Cardiology) Vevelyn Royals, MD as Consulting Physician (Ophthalmology) Franchot Gallo, MD as Consulting Physician (Urology) Tanda Rockers, MD as Consulting Physician (Pulmonary Disease) Allyn Kenner, MD (Dermatology) Deliah Goody, PA-C as Physician Assistant (Physician Assistant) Mast, Man X, NP as Nurse Practitioner (Internal Medicine) Starsky Nanna, Nelda Bucks, NP as Nurse Practitioner (Family Medicine)  Extended Emergency Contact Information Primary Emergency Contact: Damron,[name] Address: [place], [place] Phone: [phone] Mobile Phone: [phone] Relation: Spouse Secondary Emergency Contact: [name],Klar  [place] Mobile Phone: [phone] Relation: Daughter  Code Status: Full Code  Goals of care: Advanced Directive information Advanced Directives 08/26/2018  Does Patient Have a Medical Advance Directive? Yes  Type of Advance Directive Living will;Healthcare Power of Attorney  Does patient want to make changes to medical advance directive? No - Patient declined  Copy of [place] in Chart? Yes - validated most recent copy scanned in chart (See row information)  Would patient like information on creating a medical advance directive? -  Pre-existing out of facility DNR order (yellow form or pink MOST form) -     Chief Complaint  Patient presents with   Medical Management of Chronic Issues    Routine Visit     HPI:  Pt is a 85 y.o. male seen today [facility] for medical management of chronic diseases.He has a medical history of chronic combined systolic and  diastolic heart failure,Orthostatic hypotension,Hypothyroidism,depression with anxiety,GERD,RLS among other conditions.He is seen in his room sitting on his recliner.He complains of post nasal drip which makes him clear his throat frequently.Nasal drips wakes him up at times during the night.He is not on any medication for allergies though nurse states has used nasal spray in the past.overall,he states feeling well.He walks with Rolator and uses wheelchair for long distance.No recent fall or weight loss.He states appetite has been good.Previous sacral ulcer healed nursing staff continues to apply Zinc oxide for skin protection.He denies any fever,chills or cough.He is currently on clindamycin 300 mg capsule three times daily until 08/30/2018 for dental abscess ordered by dentis Dr.jacobs     Past Medical History:  Diagnosis Date   Arthritis    "minor, back and sometimes knees" (12/15/2012)   Bradycardia    AFib/SSS s/p St Jude PPM 04/12/2008   CAD (coronary artery disease) 12/30/2014   CABG (LIMA-LAD, SVG-RCA, SVG-OM in 1996).  07/2009 BMS to SVG-RCA. Cath in 04/2010 with patent stents    Cardiomyopathy, ischemic 08/25/2012   CHF (congestive heart failure) ([place])    Chronic knee pain 12/03/2014   Combined congestive systolic and diastolic heart failure ([place]) 02/02/2015   Hx EF 41%. BNP 96.8 02/21/15 Torsemide 04/06/15 Na 142, K 4.6, Bun 16, creat 0.89 04/20/14 BNP 111.7, Na 142, K 4.6, Bun 16, creat 0.9    Depression with anxiety 02/02/2015   02/21/15 Hgb A1c 5.8 03/10/15 MMSE 30/30    Dizziness, after diuretic asscoiated with hypotension and responded to fluid bolus 06/05/2011   04/28/15 US carotid R+L normal bilateral arterial velocities.     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   Exertional shortness of breath    "sometimes walking" (12/15/2012)   GERD (gastroesophageal reflux disease)    Gout 02/09/2015   Heart murmur    "just told I had one today" (12/15/2012)   Hiatal hernia    Hyperlipidemia    Hypertension    Hypothyroid    Influenza A 03/10/2016   Insomnia    Melanoma of back ([place]) 1976   Myocardial infarction ([place]) 1996; 2011   "both silent" (12/15/2012)   Nonrheumatic aortic valve stenosis    Orthostatic hypotension    Osteoporosis, senile    Pacemaker    RBBB    Restless leg 02/02/2015   Right leg weakness 12/06/2015   [facility] spotted fever    S/P CABG x 4    Sick sinus syndrome ([place]) 01/31/2014   Sustained ventricular tachycardia ([place]) 07/27/2014   Urinary retention 12/30/2014   has a cathrater   Past Surgical History:  Procedure Laterality Date   CARDIAC CATHETERIZATION  04/2010   LIMA to LAD patent,SVG to OM patent,no in-stnet restenosis RCA   CATARACT EXTRACTION W/ INTRAOCULAR LENS  IMPLANT, BILATERAL Bilateral 2012   CORONARY ANGIOPLASTY WITH STENT PLACEMENT  07/2009   bare metal stent to SVG to the RCA   CORONARY ARTERY BYPASS GRAFT  1996   LIMA to LAD,SVG to RCA & SVG to OM   INSERT / REPLACE / REMOVE PACEMAKER  2010   MELANOMA EXCISION  05/1974 X2   "taken off my back" (12/15/2012)   NM MYOVIEW LTD  06/2011   low risk   PPM GENERATOR CHANGEOUT N/A 01/22/2017   Procedure: PPM GENERATOR CHANGEOUT;  Surgeon: [name], MD;  Location: [facility];  Service: Cardiovascular;  Laterality: N/A;   TONSILLECTOMY  1938   TRANSURETHRAL RESECTION OF PROSTATE  1986   US ECHOCARDIOGRAPHY  07/11/2009   EF 45-50%    Allergies  Allergen Reactions   Altace [Ramipril] Cough   Crestor [Rosuvastatin Calcium] Rash   Penicillins Rash and  Other (See Comments)    Has patient had a PCN reaction causing immediate rash, facial/tongue/throat swelling, SOB or lightheadedness with hypotension: Yes Has patient had a PCN reaction causing severe rash involving mucus membranes or skin necrosis: No Has patient had a PCN reaction that required hospitalization No Has patient had a PCN reaction occurring within the last 10 years: No If all of the above answers are "NO", then may proceed with Cephalosporin use.    Sulfa Antibiotics Rash   Sulfamethoxazole-Trimethoprim Rash    Allergies as of 08/26/2018      Reactions   Altace [ramipril] Cough   Crestor [rosuvastatin Calcium] Rash   Penicillins Rash, Other (See Comments)   Has patient had a PCN reaction causing immediate rash, facial/tongue/throat swelling, SOB or lightheadedness with hypotension: Yes Has patient had a PCN reaction causing severe rash involving mucus membranes or skin necrosis: No Has patient had a PCN reaction that required hospitalization No Has patient had a PCN reaction occurring within the last  10 years: No If all of the above answers are "NO", then may proceed with Cephalosporin use.   Sulfa Antibiotics Rash   Sulfamethoxazole-trimethoprim Rash      Medication List       Accurate as of August 26, 2018  3:34 PM. If you have any questions, ask your nurse or doctor.        acetaminophen 500 MG tablet Commonly known as: TYLENOL Take 500 mg by mouth 3 (three) times daily.   amiodarone 100 MG tablet Commonly known as: PACERONE Take 1 tablet (100 mg total) by mouth daily.   aspirin EC 81 MG tablet Take 1 tablet (81 mg total) by mouth daily.   clindamycin 300 MG capsule Commonly known as: CLEOCIN Take 300 mg by mouth 3 (three) times daily. x 40 doses per Ramon Dredge, DDS, for dental abscess.   clopidogrel 75 MG tablet Commonly known as: PLAVIX TAKE 1 TABLET ONCE DAILY.   clotrimazole-betamethasone cream Commonly known as: LOTRISONE Apply 1  application topically daily as needed.   Decubi-Vite Caps Take 1 capsule by mouth daily.   Droxidopa 100 MG Caps Take 100 mg by mouth 3 (three) times daily.   DULoxetine 20 MG capsule Commonly known as: CYMBALTA Take 20 mg by mouth daily. For anxiety,insomnia and back pain   feeding supplement (PRO-STAT SUGAR FREE 64) Liqd Take 30 mLs by mouth 2 (two) times a day.   fluticasone 0.05 % cream Commonly known as: CUTIVATE Apply topically 2 (two) times daily. Apply small amount on  face and back also can apply as needed   guaiFENesin 100 MG/5ML Soln Commonly known as: ROBITUSSIN Take 5 mLs (100 mg total) by mouth every 4 (four) hours as needed for cough or to loosen phlegm.   levothyroxine 100 MCG tablet Commonly known as: SYNTHROID Take 100 mcg by mouth daily before breakfast.   meclizine 25 MG tablet Commonly known as: ANTIVERT Take 1 tablet (25 mg total) by mouth 2 (two) times daily as needed for dizziness.   Melatonin 3 MG Tabs Take 3 mg by mouth at bedtime.   methocarbamol 500 MG tablet Commonly known as: ROBAXIN Take 250 mg by mouth 2 (two) times daily.   midodrine 5 MG tablet Commonly known as: PROAMATINE Take 5 mg by mouth 3 (three) times daily with meals. Notify provider if BP is < 90/60. What changed: Another medication with the same name was removed. Continue taking this medication, and follow the directions you see here. Changed by: Sandrea Hughs, NP   mirtazapine 7.5 MG tablet Commonly known as: REMERON Take 7.5 mg by mouth at bedtime.   pantoprazole 40 MG tablet Commonly known as: PROTONIX TAKE 1 TABLET BY MOUTH TWICE DAILY.   Plecanatide 3 MG Tabs Commonly known as: Trulance Take 3 mg by mouth daily.   polyethylene glycol 17 g packet Commonly known as: MIRALAX / GLYCOLAX Take 17 g by mouth daily as needed. Take 1-2 capfuls in 8 oz of H2O   pravastatin 40 MG tablet Commonly known as: PRAVACHOL Take 1 tablet (40 mg total) by mouth at bedtime.     rOPINIRole 0.25 MG tablet Commonly known as: REQUIP Take 0.5 mg by mouth 2 (two) times daily.   rOPINIRole 0.5 MG tablet Commonly known as: REQUIP Take 0.5 mg by mouth daily as needed.   sennosides-docusate sodium 8.6-50 MG tablet Commonly known as: SENOKOT-S Take 2 tablets by mouth daily.   torsemide 10 MG tablet Commonly known as: DEMADEX Take 1 tablet (  10 mg total) by mouth daily.   Vitamin D3 125 MCG (5000 UT) Caps Take 5,000 Units by mouth every Monday.       Review of Systems  Constitutional: Negative for appetite change, chills, fatigue, fever and unexpected weight change.  HENT: Positive for postnasal drip. Negative for congestion, rhinorrhea, sinus pressure, sinus pain, sneezing and sore throat.   Eyes: Positive for visual disturbance. Negative for discharge, redness and itching.       Wears eye glasses   Respiratory: Negative for cough, chest tightness, shortness of breath and wheezing.   Cardiovascular: Positive for leg swelling. Negative for chest pain and palpitations.  Gastrointestinal: Negative for abdominal distention, abdominal pain, constipation, diarrhea, nausea and vomiting.  Endocrine: Negative for cold intolerance, heat intolerance, polydipsia and polyphagia.  Genitourinary: Negative for dysuria, flank pain, hematuria and urgency.       Has indwelling foley catheter changed every 30 days.   Musculoskeletal: Positive for arthralgias and gait problem. Negative for neck stiffness.  Skin: Negative for color change, pallor and rash.  Neurological: Negative for dizziness, weakness, light-headedness, numbness and headaches.  Hematological: Does not bruise/bleed easily.  Psychiatric/Behavioral: Negative for agitation, confusion and sleep disturbance. The patient is not nervous/anxious.     Immunization History  Administered Date(s) Administered   Influenza, High Dose Seasonal PF 11/01/2014, 11/13/2016   Influenza,inj,Quad PF,6+ Mos 11/24/2017    Influenza,inj,quad, With Preservative 11/17/2017   Influenza-Unspecified 12/05/2013, 11/05/2014, 11/16/2015   PPD Test 03/07/2014   Pneumococcal Conjugate-13 10/05/2009   Pneumococcal Polysaccharide-23 11/05/2005   Pneumococcal-Unspecified 10/05/2009   Tdap 08/02/2014   Zoster 11/05/2005   Pertinent  Health Maintenance Due  Topic Date Due   PNA vac Low Risk Adult (2 of 2 - PCV13) 10/06/2010   INFLUENZA VACCINE  09/05/2018   DEXA SCAN  12/14/2024   Fall Risk  10/09/2017 10/02/2016 10/30/2015 09/05/2015 02/02/2015  Falls in the past year? No No No No No  Risk for fall due to : - - Impaired balance/gait - -    Vitals:   08/26/18 1059  BP: 134/80  Pulse: 72  Resp: 16  Temp: 97.8 F (36.6 C)  TempSrc: Oral  SpO2: 92%  Weight: 174 lb 11.2 oz (79.2 kg)  Height: 5\' 10"  (1.778 m)   Body mass index is 25.07 kg/m. Physical Exam Vitals signs and nursing note reviewed.  Constitutional:      General: He is not in acute distress.    Appearance: He is overweight. He is not ill-appearing.  HENT:     Head: Normocephalic.     Nose: Nose normal. No congestion or rhinorrhea.     Mouth/Throat:     Mouth: Mucous membranes are moist.     Pharynx: Oropharynx is clear. No oropharyngeal exudate or posterior oropharyngeal erythema.  Eyes:     General: No scleral icterus.       Right eye: No discharge.        Left eye: No discharge.     Conjunctiva/sclera: Conjunctivae normal.     Pupils: Pupils are equal, round, and reactive to light.  Neck:     Musculoskeletal: Normal range of motion. No neck rigidity or muscular tenderness.  Cardiovascular:     Rate and Rhythm: Normal rate and regular rhythm.     Pulses: Normal pulses.     Heart sounds: Murmur present. No friction rub. No gallop.   Pulmonary:     Effort: Pulmonary effort is normal. No respiratory distress.     Breath sounds:  Normal breath sounds. No wheezing, rhonchi or rales.  Chest:     Chest wall: No tenderness.  Abdominal:      General: Bowel sounds are normal. There is no distension.     Palpations: Abdomen is soft. There is no mass.     Tenderness: There is no abdominal tenderness. There is no right CVA tenderness, left CVA tenderness, guarding or rebound.  Musculoskeletal:        General: No swelling or tenderness.     Comments: Unsteady gait walks with Rolator and self propels on wheelchair on long distances.bilateral lower extremities trace -1+ edema knee high ted hose in place.   Lymphadenopathy:     Cervical: No cervical adenopathy.  Skin:    General: Skin is warm and dry.     Coloration: Skin is not pale.     Findings: No bruising, erythema, lesion or rash.  Neurological:     Mental Status: He is alert and oriented to person, place, and time.     Cranial Nerves: No cranial nerve deficit.     Sensory: No sensory deficit.     Motor: No weakness.     Gait: Gait abnormal.  Psychiatric:        Mood and Affect: Mood normal.        Behavior: Behavior normal.        Thought Content: Thought content normal.        Judgment: Judgment normal.     Labs reviewed: Recent Labs    08/27/17 0423  11/25/17 0024  01/15/18 1207 01/18/18 1657 01/19/18 05/09/18  NA 142   < > 138   < > 139 139 139 138  K 3.8   < > 4.1   < > 4.1 3.8 4.6 3.6  CL 108   < > 104  --  101 104  --   --   CO2 26   < > 26  --  24 26  --   --   GLUCOSE 109*   < > 112*  --  100* 127*  --   --   BUN 18   < > 12   < > 15 17 16 18   CREATININE 0.57*   < > 0.65   < > 0.75* 0.73 0.7 0.8  CALCIUM 8.5*   < > 8.6*  --  8.9 8.3*  --   --   MG 2.1  --   --   --   --   --   --   --    < > = values in this interval not displayed.   Recent Labs    10/09/17 11/11/17 1800 12/30/17 01/18/18 1657 05/09/18  AST 13 17 21 20 18   ALT 8 12 12 13 15   ALKPHOS 79 75 92 91  --   BILITOT 0.4 0.3  --  0.5  --   PROT 6.3 6.3*  --  6.7  --   ALBUMIN 3.6 2.8*  --  3.2*  --    Recent Labs    09/04/17 1054  11/11/17 1350  11/25/17 0024 01/18/18 1657  05/09/18  WBC 12.2*   < > 10.3   < > 11.8* 7.9 10.8  NEUTROABS 8.9*  --   --   --  8.8* 5.4  --   HGB 12.8*   < > 12.4*   < > 12.5* 11.6* 11.5*  HCT 39.2   < > 40.0   < > 39.0 36.9* 33*  MCV  97.3   < > 97.8  --  98.2 98.4  --   PLT 354   < > 300   < > 284 260 202   < > = values in this interval not displayed.   Lab Results  Component Value Date   TSH 3.38 03/30/2018   Lab Results  Component Value Date   HGBA1C 5.6 12/07/2015   Lab Results  Component Value Date   CHOL 93 03/30/2018   HDL 35 03/30/2018   LDLCALC 37 03/30/2018   TRIG 127 03/30/2018   CHOLHDL 2.6 05/05/2017    Significant Diagnostic Results in last 30 days:  No results found.  Assessment/Plan 1. Chronic combined systolic and diastolic congestive heart failure (HCC) Bilateral lower extremities trace edema wears knee high ted hose.No cough,shortness of breath or wheezing.continue on Demadex 10 mg tablet daily.continue to monitor weight.   2. Hypothyroidism, unspecified type Lab Results  Component Value Date   TSH 3.38 03/30/2018  Continue on Levothyroxine 100 mcg tablet daily. Check TSH level 08/27/2018   3. Depression with anxiety Mood stable.continue on Cymbalta 20 mg capsule daily,melatonin 3 mg tablet at bedtime and mirtazapine 7.5 mg tablet daily at bedtime. Continue to monitor for mood changes.    4. Orthostatic hypotension B/p stable.Asymptomatic.continue on midodrine 5 mg tablet three times and Droxidopa 100 mg capsule three times daily.   5. RLS (restless legs syndrome) Continue on Requip 0.5 mg tablet twice daily and as needed.   6.Post nasal drip  Has used nasal spray in the past which was effective.Start on Flonase 50 mcg nasal spray one spray into each nares twice daily.continue to monitor.    Family/ staff Communication: Reviewed plan of care with patient and facility Nurse.    Labs/tests ordered: TSH level 08/27/2018.   Sandrea Hughs, NP

## 2018-08-27 LAB — TSH: TSH: 2.03 (ref 0.41–5.90)

## 2018-09-07 ENCOUNTER — Encounter: Payer: Medicare Other | Admitting: Cardiovascular Disease

## 2018-09-21 ENCOUNTER — Non-Acute Institutional Stay (SKILLED_NURSING_FACILITY): Payer: Medicare Other | Admitting: Internal Medicine

## 2018-09-21 ENCOUNTER — Other Ambulatory Visit: Payer: Self-pay | Admitting: *Deleted

## 2018-09-21 ENCOUNTER — Encounter: Payer: Self-pay | Admitting: Internal Medicine

## 2018-09-21 DIAGNOSIS — I472 Ventricular tachycardia, unspecified: Secondary | ICD-10-CM

## 2018-09-21 DIAGNOSIS — I5042 Chronic combined systolic (congestive) and diastolic (congestive) heart failure: Secondary | ICD-10-CM

## 2018-09-21 DIAGNOSIS — E039 Hypothyroidism, unspecified: Secondary | ICD-10-CM

## 2018-09-21 DIAGNOSIS — I951 Orthostatic hypotension: Secondary | ICD-10-CM

## 2018-09-21 NOTE — Progress Notes (Signed)
Location:  [facility] Room Number: 29 Place of Service:  SNF [phone]) Provider:  Veleta Miners  MD  [name], MD  Patient Care Team: [name], MD as PCP - General (Internal Medicine) [name], MD as PCP - Cardiology (Cardiology) [name], MD as Attending Physician (Cardiology) Vevelyn Royals, MD as Consulting Physician (Ophthalmology) Franchot Gallo, MD as Consulting Physician (Urology) Tanda Rockers, MD as Consulting Physician (Pulmonary Disease) Allyn Kenner, MD (Dermatology) Deliah Goody, PA-C as Physician Assistant (Physician Assistant) Mast, Man X, NP as Nurse Practitioner (Internal Medicine) Ngetich, Nelda Bucks, NP as Nurse Practitioner (Family Medicine)  Extended Emergency Contact Information Primary Emergency Contact: Langenfeld,[name] Address: [place], [place] Phone: [phone] Mobile Phone: [phone] Relation: Spouse Secondary Emergency Contact: [name],[place]  [place] Mobile Phone: [phone] Relation: Daughter  Code Status: Full Code Goals of care: Advanced Directive information Advanced Directives 09/21/2018  Does Patient Have a Medical Advance Directive? Yes  Type of Paramedic of [place];Living will  Does patient want to make changes to medical advance directive? No - Patient declined  Copy of Home in Chart? Yes - validated most recent copy scanned in chart (See row information)  Would patient like information on creating a medical advance directive? -  Pre-existing out of facility DNR order (yellow form or pink MOST form) -     Chief Complaint  Patient presents with  . Medical Management of Chronic Issues    HPI:  Pt is a 85 y.o. male seen today for medical management of chronic diseases.    He has h/o CAD s/p Bypass surgery with Extensive Inferior Wall Scarring, Ischemic  Cardiomyopathy withsystolic heart failure,Moderate AS, history of ventricular tachycardia stable on amiodarone, sinus node dysfunction with dual-chamber PPP, severe orthostatic hypotension neurogenic,history of compression fractures, history of ischemic stroke in the past on dual therapy.,History of chronic indwelling Foley catheter.  Patient overall is doing well in facility. He continues to have some episodes in which he suddenly feels dizzy but most of the time he is able to do his transfers and able to walk with mild assist and walker  He has Gained almost 15 lbs in past few months Also C/O SOB and Cough especially at night No other Issues  Past Medical History:  Diagnosis Date  . Arthritis    "minor, back and sometimes knees" (12/15/2012)  . Bradycardia    AFib/SSS s/p St Jude PPM 04/12/2008  . CAD (coronary artery disease) 12/30/2014   CABG (LIMA-LAD, SVG-RCA, SVG-OM in 1996).  07/2009 BMS to SVG-RCA. Cath in 04/2010 with patent stents   . Cardiomyopathy, ischemic 08/25/2012  . CHF (congestive heart failure) ([place])   . Chronic knee pain 12/03/2014  . Combined congestive systolic and diastolic heart failure ([place]) 02/02/2015   Hx EF 41%. BNP 96.8 02/21/15 Torsemide 04/06/15 Na 142, K 4.6, Bun 16, creat 0.89 04/20/14 BNP 111.7, Na 142, K 4.6, Bun 16, creat 0.9   . Depression with anxiety 02/02/2015   02/21/15 Hgb A1c 5.8 03/10/15 MMSE 30/30   . Dizziness, after diuretic asscoiated with hypotension and responded to fluid bolus 06/05/2011   04/28/15 US carotid R+L normal bilateral arterial velocities.    Marland Kitchen Dyspnea 08/11/2014   Followed in Pulmonary clinic/ [facility]/ [place]  - 08/11/2014  Walked RA x 1 laps @ 185 ft each stopped due to  fatigue/off balance/ slow pace/  no sob or desat  - PFT's  09/26/2014  FEV1 2.26 (85 % ) ratio 76  p no % improvement from saba with DLCO  67 % corrects to 93 % for alv volume      Since prev study 08/04/13 minimal change lung vol or dlco    . Embolic cerebral  infarction ([place]) 12/06/2015  . Exertional shortness of breath    "sometimes walking" (12/15/2012)  . GERD (gastroesophageal reflux disease)   . Gout 02/09/2015  . Heart murmur    "just told I had one today" (12/15/2012)  . Hiatal hernia   . Hyperlipidemia   . Hypertension   . Hypothyroid   . Influenza A 03/10/2016  . Insomnia   . Melanoma of back ([place]) 1976  . Myocardial infarction [facility]) 1996; 2011   "both silent" (12/15/2012)  . Nonrheumatic aortic valve stenosis   . Orthostatic hypotension   . Osteoporosis, senile   . Pacemaker   . RBBB   . Restless leg 02/02/2015  . Right leg weakness 12/06/2015  . [facility] spotted fever   . S/P CABG x 4   . Sick sinus syndrome ([place]) 01/31/2014  . Sustained ventricular tachycardia ([place]) 07/27/2014  . Urinary retention 12/30/2014   has a cathrater   Past Surgical History:  Procedure Laterality Date  . CARDIAC CATHETERIZATION  04/2010   LIMA to LAD patent,SVG to OM patent,no in-stnet restenosis RCA  . CATARACT EXTRACTION W/ INTRAOCULAR LENS  IMPLANT, BILATERAL Bilateral 2012  . CORONARY ANGIOPLASTY WITH STENT PLACEMENT  07/2009   bare metal stent to SVG to the RCA  . CORONARY ARTERY BYPASS GRAFT  1996   LIMA to LAD,SVG to RCA & SVG to OM  . INSERT / REPLACE / REMOVE PACEMAKER  2010  . MELANOMA EXCISION  05/1974 X2   "taken off my back" (12/15/2012)  . NM MYOVIEW LTD  06/2011   low risk  . PPM GENERATOR CHANGEOUT N/A 01/22/2017   Procedure: PPM GENERATOR CHANGEOUT;  Surgeon: [name], MD;  Location: [facility];  Service: Cardiovascular;  Laterality: N/A;  . TONSILLECTOMY  1938  . TRANSURETHRAL RESECTION OF PROSTATE  1986  . US ECHOCARDIOGRAPHY  07/11/2009   EF 45-50%    Allergies  Allergen Reactions  . Altace [Ramipril] Cough  . Crestor [Rosuvastatin Calcium] Rash  . Penicillins Rash and Other (See Comments)    Has patient had a PCN reaction causing immediate rash, facial/tongue/throat swelling, SOB or  lightheadedness with hypotension: Yes Has patient had a PCN reaction causing severe rash involving mucus membranes or skin necrosis: No Has patient had a PCN reaction that required hospitalization No Has patient had a PCN reaction occurring within the last 10 years: No If all of the above answers are "NO", then may proceed with Cephalosporin use.   . Sulfa Antibiotics Rash  . Sulfamethoxazole-Trimethoprim Rash    Outpatient Encounter Medications as of 09/21/2018  Medication Sig  . acetaminophen (TYLENOL) 500 MG tablet Take 500 mg by mouth 3 (three) times daily.   . Amino Acids-Protein Hydrolys (FEEDING SUPPLEMENT, PRO-STAT SUGAR FREE 64,) LIQD Take 30 mLs by mouth 2 (two) times a day.   Marland Kitchen amiodarone (PACERONE) 100 MG tablet Take 1 tablet (100 mg total) by mouth daily.  Marland Kitchen aspirin EC 81 MG tablet Take 1 tablet (81 mg total) by mouth daily.  . Cholecalciferol (VITAMIN D3) 5000 UNITS CAPS Take 5,000 Units by mouth every Monday.   . clopidogrel (  PLAVIX) 75 MG tablet TAKE 1 TABLET ONCE DAILY.  . clotrimazole-betamethasone (LOTRISONE) cream Apply 1 application topically daily as needed.  . Droxidopa 100 MG CAPS Take 100 mg by mouth 3 (three) times daily.  . DULoxetine (CYMBALTA) 20 MG capsule Take 20 mg by mouth daily. For anxiety,insomnia and back pain  . fluticasone (CUTIVATE) 0.05 % cream Apply topically 2 (two) times daily as needed. Apply small amount on  face and back also can apply as needed  . fluticasone (FLONASE) 50 MCG/ACT nasal spray Place 1 spray into both nostrils 2 (two) times daily.  Marland Kitchen guaiFENesin (ROBITUSSIN) 100 MG/5ML SOLN Take 5 mLs (100 mg total) by mouth every 4 (four) hours as needed for cough or to loosen phlegm.  Marland Kitchen levothyroxine (SYNTHROID, LEVOTHROID) 100 MCG tablet Take 100 mcg by mouth daily before breakfast.  . meclizine (ANTIVERT) 25 MG tablet Take 1 tablet (25 mg total) by mouth 2 (two) times daily as needed for dizziness.  . Melatonin 3 MG TABS Take 3 mg by mouth at  bedtime.  . methocarbamol (ROBAXIN) 500 MG tablet Take 250 mg by mouth 2 (two) times daily.   . midodrine (PROAMATINE) 5 MG tablet Take 5 mg by mouth 3 (three) times daily with meals. Notify provider if BP is < 90/60.  Marland Kitchen mirtazapine (REMERON) 7.5 MG tablet Take 7.5 mg by mouth at bedtime.  . Multiple Vitamins-Minerals (DECUBI-VITE) CAPS Take 1 capsule by mouth daily.  . Nutritional Supplements (FEEDING SUPPLEMENT, BOOST BREEZE,) LIQD Take by mouth 2 (two) times daily.  . pantoprazole (PROTONIX) 40 MG tablet TAKE 1 TABLET BY MOUTH TWICE DAILY.  Marland Kitchen Plecanatide (TRULANCE) 3 MG TABS Take 3 mg by mouth daily.  . polyethylene glycol (MIRALAX / GLYCOLAX) 17 g packet Take 17 g by mouth daily as needed. Take 1-2 capfuls in 8 oz of H2O  . pravastatin (PRAVACHOL) 40 MG tablet Take 1 tablet (40 mg total) by mouth at bedtime.  Marland Kitchen rOPINIRole (REQUIP) 0.25 MG tablet Take 0.5 mg by mouth 2 (two) times daily.  Marland Kitchen rOPINIRole (REQUIP) 0.5 MG tablet Take 0.5 mg by mouth daily as needed.  . sennosides-docusate sodium (SENOKOT-S) 8.6-50 MG tablet Take 2 tablets by mouth daily.   Marland Kitchen torsemide (DEMADEX) 10 MG tablet Take 1 tablet (10 mg total) by mouth daily.   No facility-administered encounter medications on file as of 09/21/2018.     Review of Systems  Constitutional: Negative.   HENT: Negative.   Respiratory: Positive for cough and shortness of breath.   Cardiovascular: Positive for leg swelling.  Gastrointestinal: Negative.   Genitourinary: Negative.   Musculoskeletal: Negative.   Skin: Negative.   Neurological: Positive for dizziness and weakness.  Psychiatric/Behavioral: Negative.     Immunization History  Administered Date(s) Administered  . Influenza, High Dose Seasonal PF 11/01/2014, 11/13/2016  . Influenza,inj,Quad PF,6+ Mos 11/24/2017  . Influenza,inj,quad, With Preservative 11/17/2017  . Influenza-Unspecified 12/05/2013, 11/05/2014, 11/16/2015  . PPD Test 03/07/2014  . Pneumococcal Conjugate-13  10/05/2009  . Pneumococcal Polysaccharide-23 11/05/2005  . Pneumococcal-Unspecified 10/05/2009  . Tdap 08/02/2014  . Zoster 11/05/2005   Pertinent  Health Maintenance Due  Topic Date Due  . PNA vac Low Risk Adult (2 of 2 - PCV13) 10/06/2010  . INFLUENZA VACCINE  09/05/2018  . DEXA SCAN  12/14/2024   Fall Risk  10/09/2017 10/02/2016 10/30/2015 09/05/2015 02/02/2015  Falls in the past year? No No No No No  Risk for fall due to : - - Impaired balance/gait - -  Functional Status Survey:    Vitals:   09/21/18 1510  BP: 138/72  Pulse: 70  Resp: 18  Temp: (!) 97.1 F (36.2 C)  SpO2: 94%  Weight: 176 lb 1.6 oz (79.9 kg)  Height: 5\' 10"  (1.778 m)   Body mass index is 25.27 kg/m. Physical Exam Vitals signs reviewed.  Constitutional:      Appearance: Normal appearance.  HENT:     Head: Normocephalic.     Nose: Nose normal.     Mouth/Throat:     Mouth: Mucous membranes are moist.     Pharynx: Oropharynx is clear.  Eyes:     Pupils: Pupils are equal, round, and reactive to light.  Neck:     Musculoskeletal: Neck supple.  Cardiovascular:     Rate and Rhythm: Normal rate.     Pulses: Normal pulses.     Heart sounds: Murmur present.  Pulmonary:     Effort: Pulmonary effort is normal.     Comments: Has Fine rales Bilateral Abdominal:     General: Abdomen is flat. Bowel sounds are normal.     Palpations: Abdomen is soft.  Musculoskeletal:        General: Swelling present.  Skin:    General: Skin is warm and dry.  Neurological:     General: No focal deficit present.     Mental Status: He is alert and oriented to person, place, and time.  Psychiatric:        Mood and Affect: Mood normal.        Thought Content: Thought content normal.     Labs reviewed: Recent Labs    11/25/17 0024  01/15/18 1207 01/18/18 1657 01/19/18 05/09/18  NA 138   < > 139 139 139 138  K 4.1   < > 4.1 3.8 4.6 3.6  CL 104  --  101 104  --   --   CO2 26  --  24 26  --   --   GLUCOSE 112*  --   100* 127*  --   --   BUN 12   < > 15 17 16 18   CREATININE 0.65   < > 0.75* 0.73 0.7 0.8  CALCIUM 8.6*  --  8.9 8.3*  --   --    < > = values in this interval not displayed.   Recent Labs    10/09/17  11/11/17 1800 12/30/17 01/18/18 1657 05/09/18  AST 13   < > 17 21 20 18   ALT 8   < > 12 12 13 15   ALKPHOS 79  --  75 92 91  --   BILITOT 0.4  --  0.3  --  0.5  --   PROT 6.3  --  6.3*  --  6.7  --   ALBUMIN 3.6  --  2.8*  --  3.2*  --    < > = values in this interval not displayed.   Recent Labs    11/11/17 1350  11/25/17 0024 01/18/18 1657 05/09/18  WBC 10.3   < > 11.8* 7.9 10.8  NEUTROABS  --   --  8.8* 5.4  --   HGB 12.4*   < > 12.5* 11.6* 11.5*  HCT 40.0   < > 39.0 36.9* 33*  MCV 97.8  --  98.2 98.4  --   PLT 300   < > 284 260 202   < > = values in this interval not displayed.   Lab Results  Component Value Date   TSH 2.03 08/27/2018   Lab Results  Component Value Date   HGBA1C 5.6 12/07/2015   Lab Results  Component Value Date   CHOL 93 03/30/2018   HDL 35 03/30/2018   LDLCALC 37 03/30/2018   TRIG 127 03/30/2018   CHOLHDL 2.6 05/05/2017    Significant Diagnostic Results in last 30 days:  No results found.  Assessment/Plan  Neurogenic orthostatic hypotension  On Droxidopa and Midodrine Doing well with few episodes of Dizzness Chronic combined systolic and diastolic congestive heart failure  Has gained 15 lbs over past few months Appetite is good But will check Chest Xray for CHF Also Check BNP   Other issues are stable   CAD On AspirinAnd Plavix On Statin S/P PPP On Low dose of Amiodarone Follows with Cardiology Hypothyroidism,  On Synthyroid TSH Normal in 07/20  Hyperlipidemia On statin  Neurogenic bladder Chronic Foley  RLS (restless legs syndrome) On Requip Depression with weight loss Is doing well on Remeron and Cymbalta Hiatal Hernia On Protonix  Family/ staff Communication:   Labs/tests ordered:  CMP,CBC,BNP and  Chest Xray to rule out CHF  Total time spent in this patient care encounter was  25_  minutes; greater than 50% of the visit spent counseling patient and staff, reviewing records , Labs and coordinating care for problems addressed at this encounter.

## 2018-09-23 LAB — HEPATIC FUNCTION PANEL
ALT: 14 (ref 10–40)
AST: 23 (ref 14–40)
Alkaline Phosphatase: 64 (ref 25–125)
Bilirubin, Total: 0.2

## 2018-09-23 LAB — CBC AND DIFFERENTIAL
HCT: 38 — AB (ref 41–53)
Hemoglobin: 12.7 — AB (ref 13.5–17.5)
Platelets: 234 (ref 150–399)
WBC: 6.2

## 2018-09-23 LAB — BASIC METABOLIC PANEL
BUN: 18 (ref 4–21)
Creatinine: 0.7 (ref 0.6–1.3)
Glucose: 91
Potassium: 4.1 (ref 3.4–5.3)
Sodium: 141 (ref 137–147)

## 2018-10-01 ENCOUNTER — Telehealth: Payer: Self-pay | Admitting: Internal Medicine

## 2018-10-01 NOTE — Telephone Encounter (Signed)
I D/W the Daughter of Mr Newfield about his Chest Xray. He has Mild Left Lower lobe infiltrate which he also had in 4/20 Xray. His CT scan done in 10/19 which was negative for any Mass or Cancer.His white count is normal. He has mild SOB on Exertion but otherwise asymptomatic. Repeating Chest CT to evaluate for any obstruction in Left Lower base is possibility but with his age and comorbidity I would not recommend that. Daughter Agreed.  We will repeat Chest Xray in 3 months.

## 2018-10-05 ENCOUNTER — Encounter: Payer: Medicare Other | Admitting: Cardiovascular Disease

## 2018-10-13 ENCOUNTER — Encounter: Payer: Self-pay | Admitting: Nurse Practitioner

## 2018-10-13 ENCOUNTER — Non-Acute Institutional Stay (SKILLED_NURSING_FACILITY): Payer: Medicare Other | Admitting: Nurse Practitioner

## 2018-10-13 DIAGNOSIS — S0511XA Contusion of eyeball and orbital tissues, right eye, initial encounter: Secondary | ICD-10-CM

## 2018-10-13 DIAGNOSIS — H00011 Hordeolum externum right upper eyelid: Secondary | ICD-10-CM

## 2018-10-13 DIAGNOSIS — R339 Retention of urine, unspecified: Secondary | ICD-10-CM | POA: Diagnosis not present

## 2018-10-13 DIAGNOSIS — R8281 Pyuria: Secondary | ICD-10-CM

## 2018-10-13 DIAGNOSIS — K5909 Other constipation: Secondary | ICD-10-CM

## 2018-10-13 DIAGNOSIS — H00019 Hordeolum externum unspecified eye, unspecified eyelid: Secondary | ICD-10-CM | POA: Insufficient documentation

## 2018-10-13 DIAGNOSIS — S0510XA Contusion of eyeball and orbital tissues, unspecified eye, initial encounter: Secondary | ICD-10-CM | POA: Insufficient documentation

## 2018-10-13 DIAGNOSIS — R609 Edema, unspecified: Secondary | ICD-10-CM

## 2018-10-13 NOTE — Progress Notes (Signed)
Location:   [facility] Room Number: 29 Place of Service:  SNF [id number]) Provider:  Camela Wich.Kashara Blocher X NP  [name], MD  Patient Care Team: [name], MD as PCP - General (Internal Medicine) [name], MD as PCP - Cardiology (Cardiology) [name], MD as Attending Physician (Cardiology) Vevelyn Royals, MD as Consulting Physician (Ophthalmology) Franchot Gallo, MD as Consulting Physician (Urology) Tanda Rockers, MD as Consulting Physician (Pulmonary Disease) Allyn Kenner, MD (Dermatology) Deliah Goody, PA-C as Physician Assistant (Physician Assistant) Tanza Pellot X, NP as Nurse Practitioner (Internal Medicine) Ngetich, Nelda Bucks, NP as Nurse Practitioner (Family Medicine)  Extended Emergency Contact Information Primary Emergency Contact: Joye,[name] Address: [place], [place] Phone: [phone] Mobile Phone: [phone] Relation: Spouse Secondary Emergency Contact: [name],Handlin  [place] Mobile Phone: [phone] Relation: Daughter  Code Status:  DNR Goals of care: Advanced Directive information Advanced Directives 10/13/2018  Does Patient Have a Medical Advance Directive? Yes  Type of Advance Directive -  Does patient want to make changes to medical advance directive? No - Patient declined  Copy of [place] in Chart? -  Would patient like information on creating a medical advance directive? -  Pre-existing out of facility DNR order (yellow form or pink MOST form) -     Chief Complaint  Patient presents with  . Acute Visit    Right Eye    HPI:  Pt is a 85 y.o. male seen today for an acute visit for medial right upper eye lid ecchymosis, right upper lid stye, the patient denied change of vision or pain in eye. Staff reported purulent appearance urine. Hx of urinary retention, Foley, he denied abd pain, he is afebrile. HPI was provided with  assistance of staff. BLE edema, minimal, on Torsemide 10mg  qd. C/o constipation, not well controlled, on Senokot II qd, MiraLax prn, Trulance 3mg  qd.    Past Medical History:  Diagnosis Date  . Arthritis    "minor, back and sometimes knees" (12/15/2012)  . Bradycardia    AFib/SSS s/p St Jude PPM 04/12/2008  . CAD (coronary artery disease) 12/30/2014   CABG (LIMA-LAD, SVG-RCA, SVG-OM in 1996).  07/2009 BMS to SVG-RCA. Cath in 04/2010 with patent stents   . Cardiomyopathy, ischemic 08/25/2012  . CHF (congestive heart failure) ([place])   . Chronic knee pain 12/03/2014  . Combined congestive systolic and diastolic heart failure ([place]) 02/02/2015   Hx EF 41%. BNP 96.8 02/21/15 Torsemide 04/06/15 Na 142, K 4.6, Bun 16, creat 0.89 04/20/14 BNP 111.7, Na 142, K 4.6, Bun 16, creat 0.9   . Depression with anxiety 02/02/2015   02/21/15 Hgb A1c 5.8 03/10/15 MMSE 30/30   . Dizziness, after diuretic asscoiated with hypotension and responded to fluid bolus 06/05/2011   04/28/15 US carotid R+L normal bilateral arterial velocities.    Marland Kitchen Dyspnea 08/11/2014   Followed in Pulmonary clinic/ [facility]/ [facility]  - 08/11/2014  Walked RA x 1 laps @ 185 ft each stopped due to fatigue/off balance/ slow pace/  no sob or desat  - PFT's  09/26/2014  FEV1 2.26 (85 % ) ratio 76  p no % improvement from saba with DLCO  67 % corrects to 93 % for alv volume      Since prev study 08/04/13 minimal change lung vol or dlco    . Embolic cerebral infarction ([facility]) 12/06/2015  .  Exertional shortness of breath    "sometimes walking" (12/15/2012)  . GERD (gastroesophageal reflux disease)   . Gout 02/09/2015  . Heart murmur    "just told I had one today" (12/15/2012)  . Hiatal hernia   . Hyperlipidemia   . Hypertension   . Hypothyroid   . Influenza A 03/10/2016  . Insomnia   . Melanoma of back ([facility]) 1976  . Myocardial infarction [facility]) 1996; 2011   "both silent" (12/15/2012)  . Nonrheumatic aortic valve stenosis   . Orthostatic hypotension   .  Osteoporosis, senile   . Pacemaker   . RBBB   . Restless leg 02/02/2015  . Right leg weakness 12/06/2015  . [facility] spotted fever   . S/P CABG x 4   . Sick sinus syndrome ([place]) 01/31/2014  . Sustained ventricular tachycardia ([place]) 07/27/2014  . Urinary retention 12/30/2014   has a cathrater   Past Surgical History:  Procedure Laterality Date  . CARDIAC CATHETERIZATION  04/2010   LIMA to LAD patent,SVG to OM patent,no in-stnet restenosis RCA  . CATARACT EXTRACTION W/ INTRAOCULAR LENS  IMPLANT, BILATERAL Bilateral 2012  . CORONARY ANGIOPLASTY WITH STENT PLACEMENT  07/2009   bare metal stent to SVG to the RCA  . CORONARY ARTERY BYPASS GRAFT  1996   LIMA to LAD,SVG to RCA & SVG to OM  . INSERT / REPLACE / REMOVE PACEMAKER  2010  . MELANOMA EXCISION  05/1974 X2   "taken off my back" (12/15/2012)  . NM MYOVIEW LTD  06/2011   low risk  . PPM GENERATOR CHANGEOUT N/A 01/22/2017   Procedure: PPM GENERATOR CHANGEOUT;  Surgeon: [name], MD;  Location: [facility] CV [facility];  Service: Cardiovascular;  Laterality: N/A;  . TONSILLECTOMY  1938  . TRANSURETHRAL RESECTION OF PROSTATE  1986  . US ECHOCARDIOGRAPHY  07/11/2009   EF 45-50%    Allergies  Allergen Reactions  . Altace [Ramipril] Cough  . Crestor [Rosuvastatin Calcium] Rash  . Penicillins Rash and Other (See Comments)    Has patient had a PCN reaction causing immediate rash, facial/tongue/throat swelling, SOB or lightheadedness with hypotension: Yes Has patient had a PCN reaction causing severe rash involving mucus membranes or skin necrosis: No Has patient had a PCN reaction that required hospitalization No Has patient had a PCN reaction occurring within the last 10 years: No If all of the above answers are "NO", then may proceed with Cephalosporin use.   . Sulfa Antibiotics Rash  . Sulfamethoxazole-Trimethoprim Rash    Allergies as of 10/13/2018      Reactions   Altace [ramipril] Cough   Crestor [rosuvastatin Calcium]  Rash   Penicillins Rash, Other (See Comments)   Has patient had a PCN reaction causing immediate rash, facial/tongue/throat swelling, SOB or lightheadedness with hypotension: Yes Has patient had a PCN reaction causing severe rash involving mucus membranes or skin necrosis: No Has patient had a PCN reaction that required hospitalization No Has patient had a PCN reaction occurring within the last 10 years: No If all of the above answers are "NO", then may proceed with Cephalosporin use.   Sulfa Antibiotics Rash   Sulfamethoxazole-trimethoprim Rash      Medication List       Accurate as of October 13, 2018 11:59 PM. If you have any questions, ask your nurse or doctor.        acetaminophen 500 MG tablet Commonly known as: TYLENOL Take 500 mg by mouth 3 (three) times daily.   amiodarone  100 MG tablet Commonly known as: PACERONE Take 1 tablet (100 mg total) by mouth daily.   aspirin EC 81 MG tablet Take 1 tablet (81 mg total) by mouth daily.   clopidogrel 75 MG tablet Commonly known as: PLAVIX TAKE 1 TABLET ONCE DAILY.   clotrimazole-betamethasone cream Commonly known as: LOTRISONE Apply 1 application topically daily as needed.   Decubi-Vite Caps Take 1 capsule by mouth daily.   Droxidopa 100 MG Caps Take 100 mg by mouth 3 (three) times daily.   DULoxetine 20 MG capsule Commonly known as: CYMBALTA Take 20 mg by mouth daily. For anxiety,insomnia and back pain   feeding supplement (BOOST BREEZE) Liqd Take by mouth 2 (two) times daily.   feeding supplement (PRO-STAT SUGAR FREE 64) Liqd Take 30 mLs by mouth 2 (two) times a day.   fluticasone 0.05 % cream Commonly known as: CUTIVATE Apply topically 2 (two) times daily as needed. Apply small amount on  face and back also can apply as needed   fluticasone 50 MCG/ACT nasal spray Commonly known as: FLONASE Place 1 spray into both nostrils 2 (two) times daily.   guaiFENesin 100 MG/5ML Soln Commonly known as: ROBITUSSIN  Take 5 mLs (100 mg total) by mouth every 4 (four) hours as needed for cough or to loosen phlegm.   levothyroxine 100 MCG tablet Commonly known as: SYNTHROID Take 100 mcg by mouth daily before breakfast.   meclizine 25 MG tablet Commonly known as: ANTIVERT Take 1 tablet (25 mg total) by mouth 2 (two) times daily as needed for dizziness.   Melatonin 3 MG Tabs Take 3 mg by mouth at bedtime.   methocarbamol 500 MG tablet Commonly known as: ROBAXIN Take 250 mg by mouth 2 (two) times daily.   midodrine 5 MG tablet Commonly known as: PROAMATINE Take 5 mg by mouth 3 (three) times daily with meals. Notify provider if BP is < 90/60.   mirtazapine 7.5 MG tablet Commonly known as: REMERON Take 7.5 mg by mouth at bedtime.   pantoprazole 40 MG tablet Commonly known as: PROTONIX TAKE 1 TABLET BY MOUTH TWICE DAILY.   Plecanatide 3 MG Tabs Commonly known as: Trulance Take 3 mg by mouth daily.   polyethylene glycol 17 g packet Commonly known as: MIRALAX / GLYCOLAX Take 17 g by mouth daily as needed. Take 1-2 capfuls in 8 oz of H2O   pravastatin 40 MG tablet Commonly known as: PRAVACHOL Take 1 tablet (40 mg total) by mouth at bedtime.   rOPINIRole 0.25 MG tablet Commonly known as: REQUIP Take 0.5 mg by mouth 2 (two) times daily.   rOPINIRole 0.5 MG tablet Commonly known as: REQUIP Take 0.5 mg by mouth daily as needed.   sennosides-docusate sodium 8.6-50 MG tablet Commonly known as: SENOKOT-S Take 2 tablets by mouth daily.   torsemide 10 MG tablet Commonly known as: DEMADEX Take 1 tablet (10 mg total) by mouth daily.   Vitamin D3 125 MCG (5000 UT) Caps Take 5,000 Units by mouth every Monday.      ROS was provided with assistance of staff.  Review of Systems  Constitutional: Negative for activity change, appetite change, chills, diaphoresis, fatigue and fever.  HENT: Positive for hearing loss. Negative for congestion and voice change.   Eyes: Negative for photophobia,  pain, discharge, redness and itching.       Lateral right upper eye lid stye, medial upper right eye lid ecchymosis.   Respiratory: Positive for cough and shortness of breath. Negative for wheezing.  DOE  Cardiovascular: Positive for leg swelling. Negative for chest pain and palpitations.  Gastrointestinal: Positive for constipation.  Genitourinary:       Foley  Musculoskeletal: Positive for gait problem.  Skin: Negative for color change and pallor.  Neurological: Negative for dizziness, speech difficulty, weakness and headaches.       Memory lapses.   Psychiatric/Behavioral: Negative for agitation, behavioral problems, hallucinations and sleep disturbance. The patient is not nervous/anxious.     Immunization History  Administered Date(s) Administered  . Influenza, High Dose Seasonal PF 11/01/2014, 11/13/2016  . Influenza,inj,Quad PF,6+ Mos 11/24/2017  . Influenza,inj,quad, With Preservative 11/17/2017  . Influenza-Unspecified 12/05/2013, 11/05/2014, 11/16/2015  . PPD Test 03/07/2014  . Pneumococcal Conjugate-13 10/05/2009  . Pneumococcal Polysaccharide-23 11/05/2005  . Pneumococcal-Unspecified 10/05/2009  . Tdap 08/02/2014  . Zoster 11/05/2005   Pertinent  Health Maintenance Due  Topic Date Due  . INFLUENZA VACCINE  11/12/2018 (Originally 09/05/2018)  . PNA vac Low Risk Adult (2 of 2 - PCV13) 11/12/2018 (Originally 10/06/2010)  . DEXA SCAN  12/14/2024   Fall Risk  10/09/2017 10/02/2016 10/30/2015 09/05/2015 02/02/2015  Falls in the past year? No No No No No  Risk for fall due to : - - Impaired balance/gait - -   Functional Status Survey:    Vitals:   10/13/18 1722  BP: 129/77  Pulse: 69  Resp: 20  Temp: (!) 97.5 F (36.4 C)  SpO2: 96%  Weight: 177 lb 4.8 oz (80.4 kg)  Height: 5\' 10"  (1.778 m)   Body mass index is 25.44 kg/m. Physical Exam Constitutional:      General: He is not in acute distress.    Appearance: Normal appearance. He is not ill-appearing,  toxic-appearing or diaphoretic.  HENT:     Head: Normocephalic and atraumatic.     Nose: Nose normal.     Mouth/Throat:     Mouth: Mucous membranes are moist.  Eyes:     Extraocular Movements: Extraocular movements intact.     Conjunctiva/sclera: Conjunctivae normal.     Pupils: Pupils are equal, round, and reactive to light.     Comments: Lateral right upper eye lid stye, medial upper right eye lid ecchymosis.    Neck:     Musculoskeletal: Normal range of motion and neck supple.  Cardiovascular:     Rate and Rhythm: Normal rate and regular rhythm.     Heart sounds: Murmur present.  Pulmonary:     Breath sounds: Rales present. No wheezing or rhonchi.     Comments: Bibasilar rales.  Abdominal:     General: Bowel sounds are normal.     Palpations: Abdomen is soft.     Tenderness: There is no abdominal tenderness. There is no right CVA tenderness, left CVA tenderness, guarding or rebound.  Genitourinary:    Comments: Foley, purulent appearance urine.  Musculoskeletal:     Right lower leg: Edema present.     Left lower leg: Edema present.     Comments: Trace edema BLE  Skin:    General: Skin is warm and dry.  Neurological:     General: No focal deficit present.     Mental Status: He is alert. Mental status is at baseline.     Comments: Oriented to person, place.   Psychiatric:        Mood and Affect: Mood normal.        Behavior: Behavior normal.        Thought Content: Thought content normal.  Labs reviewed: Recent Labs    11/25/17 0024  01/15/18 1207 01/18/18 1657 01/19/18 05/09/18 09/23/18  NA 138   < > 139 139 139 138 141  K 4.1   < > 4.1 3.8 4.6 3.6 4.1  CL 104  --  101 104  --   --   --   CO2 26  --  24 26  --   --   --   GLUCOSE 112*  --  100* 127*  --   --   --   BUN 12   < > 15 17 16 18 18   CREATININE 0.65   < > 0.75* 0.73 0.7 0.8 0.7  CALCIUM 8.6*  --  8.9 8.3*  --   --   --    < > = values in this interval not displayed.   Recent Labs    11/11/17  1800 12/30/17 01/18/18 1657 05/09/18 09/23/18  AST 17 21 20 18 23   ALT 12 12 13 15 14   ALKPHOS 75 92 91  --  64  BILITOT 0.3  --  0.5  --   --   PROT 6.3*  --  6.7  --   --   ALBUMIN 2.8*  --  3.2*  --   --    Recent Labs    11/11/17 1350  11/25/17 0024 01/18/18 1657 05/09/18 09/23/18  WBC 10.3   < > 11.8* 7.9 10.8 6.2  NEUTROABS  --   --  8.8* 5.4  --   --   HGB 12.4*   < > 12.5* 11.6* 11.5* 12.7*  HCT 40.0   < > 39.0 36.9* 33* 38*  MCV 97.8  --  98.2 98.4  --   --   PLT 300   < > 284 260 202 234   < > = values in this interval not displayed.   Lab Results  Component Value Date   TSH 2.03 08/27/2018   Lab Results  Component Value Date   HGBA1C 5.6 12/07/2015   Lab Results  Component Value Date   CHOL 93 03/30/2018   HDL 35 03/30/2018   LDLCALC 37 03/30/2018   TRIG 127 03/30/2018   CHOLHDL 2.6 05/05/2017    Significant Diagnostic Results in last 30 days:  No results found.  Assessment/Plan Stye external 10/13/18 lateral upper right eyelid, observe.   Ecchymosis of eye 10/13/18 medical right eyelid. Observe.    Urine purulent Obtain UA C/S to r/o UTI .  Urinary retention Continue Foley, 16 Fr/10cc q month change.   Edema BLE, minimal, continue Torsemide 10mg  qd.   Chronic constipation not well controlled, increase Senokot II bid/qd, continue MiraLax prn, Trulance 3mg  qd.       Family/ staff Communication: plan of care reviewed with the patient and charge nurse.   Labs/tests ordered:  UA C/S  Time spend 25 minutes.

## 2018-10-16 ENCOUNTER — Encounter: Payer: Self-pay | Admitting: Nurse Practitioner

## 2018-10-16 DIAGNOSIS — R8281 Pyuria: Secondary | ICD-10-CM | POA: Insufficient documentation

## 2018-10-16 DIAGNOSIS — R609 Edema, unspecified: Secondary | ICD-10-CM | POA: Insufficient documentation

## 2018-10-16 NOTE — Assessment & Plan Note (Signed)
not well controlled, increase Senokot II bid/qd, continue MiraLax prn, Trulance 3mg  qd.

## 2018-10-16 NOTE — Assessment & Plan Note (Signed)
BLE, minimal, continue Torsemide 10mg  qd.

## 2018-10-16 NOTE — Assessment & Plan Note (Signed)
Obtain UA C/S to r/o UTI .

## 2018-10-16 NOTE — Assessment & Plan Note (Signed)
10/13/18 lateral upper right eyelid, observe.

## 2018-10-16 NOTE — Assessment & Plan Note (Signed)
Continue Foley, 16 Fr/10cc q month change.

## 2018-10-16 NOTE — Assessment & Plan Note (Signed)
10/13/18 medical right eyelid. Observe.

## 2018-10-19 ENCOUNTER — Encounter: Payer: Self-pay | Admitting: Internal Medicine

## 2018-10-19 ENCOUNTER — Non-Acute Institutional Stay (SKILLED_NURSING_FACILITY): Payer: Medicare Other | Admitting: Internal Medicine

## 2018-10-19 DIAGNOSIS — Z978 Presence of other specified devices: Secondary | ICD-10-CM

## 2018-10-19 DIAGNOSIS — Z960 Presence of urogenital implants: Secondary | ICD-10-CM

## 2018-10-19 DIAGNOSIS — N319 Neuromuscular dysfunction of bladder, unspecified: Secondary | ICD-10-CM | POA: Diagnosis not present

## 2018-10-19 NOTE — Progress Notes (Signed)
Location:  [facility] Room Number: 29 Place of Service:  SNF [phone]) Provider:  [name], MD  [name], MD  Patient Care Team: [name], MD as PCP - General (Internal Medicine) [name], MD as PCP - Cardiology (Cardiology) [name], MD as Attending Physician (Cardiology) Vevelyn Royals, MD as Consulting Physician (Ophthalmology) Franchot Gallo, MD as Consulting Physician (Urology) Tanda Rockers, MD as Consulting Physician (Pulmonary Disease) Allyn Kenner, MD (Dermatology) Deliah Goody, PA-C as Physician Assistant (Physician Assistant) Mast, Man X, NP as Nurse Practitioner (Internal Medicine) Ngetich, Nelda Bucks, NP as Nurse Practitioner (Family Medicine)  Extended Emergency Contact Information Primary Emergency Contact: Spilker,[name] Address: [address], [place] Phone: [phone] Mobile Phone: [phone] Relation: Spouse Secondary Emergency Contact: [name],Carrera  [place] Mobile Phone: [phone] Relation: Daughter  Code Status: FULL Goals of care: Advanced Directive information Advanced Directives 10/19/2018  Does Patient Have a Medical Advance Directive? Yes  Type of Paramedic of [place];Living will  Does patient want to make changes to medical advance directive? No - Patient declined  Copy of [place] in Chart? Yes - validated most recent copy scanned in chart (See row information)  Would patient like information on creating a medical advance directive? -  Pre-existing out of facility DNR order (yellow form or pink MOST form) -     Chief Complaint  Patient presents with   Acute Visit    UTI    HPI:  Pt is a 85 y.o. male seen today for an acute visit for Possible UTi He has h/o CAD s/p Bypass surgery with Extensive Inferior Wall Scarring, Ischemic Cardiomyopathy withsystolic heart  failure,Moderate AS,history of ventricular tachycardia stable on amiodarone, sinus node dysfunction with dual-chamber PPP, severe orthostatic hypotension neurogenic,history of compression fractures, history of ischemic stroke in the past on dual therapy.,History of chronic indwelling Foley catheter.  Patient was seen today as the nurses had noticed few days ago that patient was having very thick and smelly urine in his bag.  The urine was sent for analysis but unfortunately the cultures were not done.  The urinalysis did show more than 50 WBC with positive leukoesterase. Patient denies any fever or lower abdominal pain His catheter was changed over the weekend. And today his Urine was much Clearer    Past Medical History:  Diagnosis Date   Arthritis    "minor, back and sometimes knees" (12/15/2012)   Bradycardia    AFib/SSS s/p St Jude PPM 04/12/2008   CAD (coronary artery disease) 12/30/2014   CABG (LIMA-LAD, SVG-RCA, SVG-OM in 1996).  07/2009 BMS to SVG-RCA. Cath in 04/2010 with patent stents    Cardiomyopathy, ischemic 08/25/2012   CHF (congestive heart failure) ([place])    Chronic knee pain 12/03/2014   Combined congestive systolic and diastolic heart failure ([place]) 02/02/2015   Hx EF 41%. BNP 96.8 02/21/15 Torsemide 04/06/15 Na 142, K 4.6, Bun 16, creat 0.89 04/20/14 BNP 111.7, Na 142, K 4.6, Bun 16, creat 0.9    Depression with anxiety 02/02/2015   02/21/15 Hgb A1c 5.8 03/10/15 MMSE 30/30    Dizziness, after diuretic asscoiated with hypotension and responded to fluid bolus 06/05/2011   04/28/15 US carotid R+L normal bilateral arterial velocities.     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   Exertional shortness of breath    "sometimes walking" (12/15/2012)   GERD (gastroesophageal reflux disease)    Gout 02/09/2015   Heart murmur    "just told I had one today" (12/15/2012)   Hiatal hernia    Hyperlipidemia    Hypertension    Hypothyroid    Influenza A 03/10/2016   Insomnia    Melanoma of back ([place]) 1976   Myocardial infarction ([place]) 1996; 2011   "both silent" (12/15/2012)   Nonrheumatic aortic valve stenosis    Orthostatic hypotension    Osteoporosis, senile    Pacemaker    RBBB    Restless leg 02/02/2015   Right leg weakness 12/06/2015   [facility] spotted fever    S/P CABG x 4    Sick sinus syndrome ([place]) 01/31/2014   Sustained ventricular tachycardia ([place]) 07/27/2014   Urinary retention 12/30/2014   has a cathrater   Past Surgical History:  Procedure Laterality Date   CARDIAC CATHETERIZATION  04/2010   LIMA to LAD patent,SVG to OM patent,no in-stnet restenosis RCA   CATARACT EXTRACTION W/ INTRAOCULAR LENS  IMPLANT, BILATERAL Bilateral 2012   CORONARY ANGIOPLASTY WITH STENT PLACEMENT  07/2009   bare metal stent to SVG to the RCA   CORONARY ARTERY BYPASS GRAFT  1996   LIMA to LAD,SVG to RCA & SVG to OM   INSERT / REPLACE / REMOVE PACEMAKER  2010   MELANOMA EXCISION  05/1974 X2   "taken off my back" (12/15/2012)   NM MYOVIEW LTD  06/2011   low risk   PPM GENERATOR CHANGEOUT N/A 01/22/2017   Procedure: PPM GENERATOR CHANGEOUT;  Surgeon: [name], MD;  Location: [facility];  Service: Cardiovascular;  Laterality: N/A;   TONSILLECTOMY  1938   TRANSURETHRAL RESECTION OF PROSTATE  1986   US ECHOCARDIOGRAPHY  07/11/2009   EF 45-50%    Allergies  Allergen Reactions   Altace [Ramipril] Cough   Crestor [Rosuvastatin Calcium] Rash   Penicillins Rash and Other (See Comments)    Has patient had a PCN reaction causing immediate  rash, facial/tongue/throat swelling, SOB or lightheadedness with hypotension: Yes Has patient had a PCN reaction causing severe rash involving mucus membranes or skin necrosis: No Has patient had a PCN reaction that required hospitalization No Has patient had a PCN reaction occurring within the last 10 years: No If all of the above answers are "NO", then may proceed with Cephalosporin use.    Sulfa Antibiotics Rash   Sulfamethoxazole-Trimethoprim Rash    Outpatient Encounter Medications as of 10/19/2018  Medication Sig   acetaminophen (TYLENOL) 500 MG tablet Take 500 mg by mouth 3 (three) times daily.    Amino Acids-Protein Hydrolys (FEEDING SUPPLEMENT, PRO-STAT SUGAR FREE 64,) LIQD Take 30 mLs by mouth 2 (two) times a day.    amiodarone (PACERONE) 100 MG tablet Take 1 tablet (100 mg total) by mouth daily.   aspirin EC 81 MG tablet Take 1 tablet (81 mg total) by mouth daily.   Cholecalciferol (VITAMIN D3) 5000 UNITS CAPS Take 5,000  Units by mouth every Monday.    clopidogrel (PLAVIX) 75 MG tablet TAKE 1 TABLET ONCE DAILY.   clotrimazole-betamethasone (LOTRISONE) cream Apply 1 application topically daily as needed.   Droxidopa 100 MG CAPS Take 100 mg by mouth 3 (three) times daily.   DULoxetine (CYMBALTA) 20 MG capsule Take 20 mg by mouth daily. For anxiety,insomnia and back pain   fluticasone (CUTIVATE) 0.05 % cream Apply topically 2 (two) times daily as needed. Apply small amount on  face and back also can apply as needed   fluticasone (FLONASE) 50 MCG/ACT nasal spray Place 1 spray into both nostrils 2 (two) times daily.   guaiFENesin (ROBITUSSIN) 100 MG/5ML SOLN Take 5 mLs (100 mg total) by mouth every 4 (four) hours as needed for cough or to loosen phlegm.   levothyroxine (SYNTHROID, LEVOTHROID) 100 MCG tablet Take 100 mcg by mouth daily before breakfast.   meclizine (ANTIVERT) 25 MG tablet Take 1 tablet (25 mg total) by mouth 2 (two) times daily as needed for dizziness.     Melatonin 3 MG TABS Take 3 mg by mouth at bedtime.   methocarbamol (ROBAXIN) 500 MG tablet Take 250 mg by mouth 2 (two) times daily.    midodrine (PROAMATINE) 5 MG tablet Take 5 mg by mouth 3 (three) times daily with meals. Notify provider if BP is < 90/60.   mirtazapine (REMERON) 7.5 MG tablet Take 7.5 mg by mouth at bedtime.   Multiple Vitamins-Minerals (DECUBI-VITE) CAPS Take 1 capsule by mouth daily.   Nutritional Supplements (FEEDING SUPPLEMENT, BOOST BREEZE,) LIQD Take by mouth 2 (two) times daily.   pantoprazole (PROTONIX) 40 MG tablet TAKE 1 TABLET BY MOUTH TWICE DAILY.   Plecanatide (TRULANCE) 3 MG TABS Take 3 mg by mouth daily.   polyethylene glycol (MIRALAX / GLYCOLAX) 17 g packet Take 17 g by mouth daily as needed. Take 1-2 capfuls in 8 oz of H2O   pravastatin (PRAVACHOL) 40 MG tablet Take 1 tablet (40 mg total) by mouth at bedtime.   rOPINIRole (REQUIP) 0.25 MG tablet Take 0.5 mg by mouth 2 (two) times daily.   rOPINIRole (REQUIP) 0.5 MG tablet Take 0.5 mg by mouth daily as needed.   sennosides-docusate sodium (SENOKOT-S) 8.6-50 MG tablet Take 2 tablets by mouth 2 (two) times daily.    torsemide (DEMADEX) 10 MG tablet Take 1 tablet (10 mg total) by mouth daily.   No facility-administered encounter medications on file as of 10/19/2018.     Review of Systems  Constitutional: Negative.   HENT: Negative.   Respiratory: Negative.   Cardiovascular: Negative.   Gastrointestinal: Negative for abdominal distention and abdominal pain.  Genitourinary: Negative for flank pain.  Musculoskeletal: Negative.   Skin: Negative.   Neurological: Positive for dizziness and weakness.  Psychiatric/Behavioral: Negative.   All other systems reviewed and are negative.   Immunization History  Administered Date(s) Administered   Influenza, High Dose Seasonal PF 11/01/2014, 11/13/2016   Influenza,inj,Quad PF,6+ Mos 11/24/2017   Influenza,inj,quad, With Preservative 11/17/2017    Influenza-Unspecified 12/05/2013, 11/05/2014, 11/16/2015   PPD Test 03/07/2014   Pneumococcal Conjugate-13 10/05/2009   Pneumococcal Polysaccharide-23 11/05/2005   Pneumococcal-Unspecified 10/05/2009   Tdap 08/02/2014   Zoster 11/05/2005   Pertinent  Health Maintenance Due  Topic Date Due   INFLUENZA VACCINE  11/12/2018 (Originally 09/05/2018)   PNA vac Low Risk Adult (2 of 2 - PCV13) 11/12/2018 (Originally 10/06/2010)   DEXA SCAN  12/14/2024   Fall Risk  10/09/2017 10/02/2016 10/30/2015 09/05/2015 02/02/2015  Falls  in the past year? No No No No No  Risk for fall due to : - - Impaired balance/gait - -   Functional Status Survey:    Vitals:   10/19/18 1355  BP: 122/75  Pulse: 69  Resp: 18  Temp: (!) 97.3 F (36.3 C)  TempSrc: Oral  SpO2: 95%  Weight: 177 lb 4.8 oz (80.4 kg)  Height: 5\' 10"  (1.778 m)   Body mass index is 25.44 kg/m. Physical Exam Vitals signs reviewed.  Constitutional:      Appearance: Normal appearance.  HENT:     Head: Normocephalic.     Nose: Nose normal.     Mouth/Throat:     Mouth: Mucous membranes are moist.     Pharynx: Oropharynx is clear.  Eyes:     Pupils: Pupils are equal, round, and reactive to light.  Neck:     Musculoskeletal: Neck supple.  Cardiovascular:     Rate and Rhythm: Normal rate.     Heart sounds: Murmur present.  Pulmonary:     Effort: Pulmonary effort is normal.     Breath sounds: Normal breath sounds.  Abdominal:     General: Abdomen is flat. Bowel sounds are normal. There is no distension.     Palpations: Abdomen is soft.     Tenderness: There is no abdominal tenderness.  Skin:    General: Skin is warm and dry.  Neurological:     General: No focal deficit present.     Mental Status: He is alert and oriented to person, place, and time.     Labs reviewed: Recent Labs    11/25/17 0024  01/15/18 1207 01/18/18 1657 01/19/18 05/09/18 09/23/18  NA 138   < > 139 139 139 138 141  K 4.1   < > 4.1 3.8 4.6 3.6  4.1  CL 104  --  101 104  --   --   --   CO2 26  --  24 26  --   --   --   GLUCOSE 112*  --  100* 127*  --   --   --   BUN 12   < > 15 17 16 18 18   CREATININE 0.65   < > 0.75* 0.73 0.7 0.8 0.7  CALCIUM 8.6*  --  8.9 8.3*  --   --   --    < > = values in this interval not displayed.   Recent Labs    11/11/17 1800 12/30/17 01/18/18 1657 05/09/18 09/23/18  AST 17 21 20 18 23   ALT 12 12 13 15 14   ALKPHOS 75 92 91  --  64  BILITOT 0.3  --  0.5  --   --   PROT 6.3*  --  6.7  --   --   ALBUMIN 2.8*  --  3.2*  --   --    Recent Labs    11/11/17 1350  11/25/17 0024 01/18/18 1657 05/09/18 09/23/18  WBC 10.3   < > 11.8* 7.9 10.8 6.2  NEUTROABS  --   --  8.8* 5.4  --   --   HGB 12.4*   < > 12.5* 11.6* 11.5* 12.7*  HCT 40.0   < > 39.0 36.9* 33* 38*  MCV 97.8  --  98.2 98.4  --   --   PLT 300   < > 284 260 202 234   < > = values in this interval not displayed.   Lab Results  Component  Value Date   TSH 2.03 08/27/2018   Lab Results  Component Value Date   HGBA1C 5.6 12/07/2015   Lab Results  Component Value Date   CHOL 93 03/30/2018   HDL 35 03/30/2018   LDLCALC 37 03/30/2018   TRIG 127 03/30/2018   CHOLHDL 2.6 05/05/2017    Significant Diagnostic Results in last 30 days:  No results found.  Assessment/Plan Possible UTI in Patient with Chronic Foley Catheter Nurses were concerned that patient possibly has a UTI But he is asymptomatic though his and his urine is now clear We will continue to push fluids Catheter is changed The urine will be sent for culture Will hold antibiotics for now   Family/ staff Communication:   Labs/tests ordered: Urine Culture  Total time spent in this patient care encounter was  25_  minutes; greater than 50% of the visit spent counseling patient and staff, reviewing records , Labs and coordinating care for problems addressed at this encounter.

## 2018-10-22 ENCOUNTER — Encounter: Payer: Self-pay | Admitting: Nurse Practitioner

## 2018-10-22 ENCOUNTER — Non-Acute Institutional Stay (SKILLED_NURSING_FACILITY): Payer: Medicare Other | Admitting: Nurse Practitioner

## 2018-10-22 DIAGNOSIS — I5042 Chronic combined systolic (congestive) and diastolic (congestive) heart failure: Secondary | ICD-10-CM | POA: Diagnosis not present

## 2018-10-22 DIAGNOSIS — I472 Ventricular tachycardia, unspecified: Secondary | ICD-10-CM

## 2018-10-22 DIAGNOSIS — F418 Other specified anxiety disorders: Secondary | ICD-10-CM

## 2018-10-22 DIAGNOSIS — I951 Orthostatic hypotension: Secondary | ICD-10-CM

## 2018-10-22 DIAGNOSIS — K5909 Other constipation: Secondary | ICD-10-CM | POA: Diagnosis not present

## 2018-10-22 DIAGNOSIS — K219 Gastro-esophageal reflux disease without esophagitis: Secondary | ICD-10-CM

## 2018-10-22 DIAGNOSIS — E039 Hypothyroidism, unspecified: Secondary | ICD-10-CM

## 2018-10-22 DIAGNOSIS — G8929 Other chronic pain: Secondary | ICD-10-CM

## 2018-10-22 DIAGNOSIS — R339 Retention of urine, unspecified: Secondary | ICD-10-CM

## 2018-10-22 DIAGNOSIS — M25569 Pain in unspecified knee: Secondary | ICD-10-CM

## 2018-10-22 DIAGNOSIS — G2581 Restless legs syndrome: Secondary | ICD-10-CM

## 2018-10-22 NOTE — Assessment & Plan Note (Addendum)
Stable, continue Senokot S II bid, prn MiraLax, Trulance 3mg  qd.

## 2018-10-22 NOTE — Assessment & Plan Note (Signed)
Foley

## 2018-10-22 NOTE — Assessment & Plan Note (Signed)
Heart rate is in control, continue Amiodarone 100mg qd.  

## 2018-10-22 NOTE — Assessment & Plan Note (Signed)
Hx of EF 41%, c/o cough, SOB, denied chest pain/pressure, or palpitation, last CXR 09/22/18 showed left base infiltrate. Continue Torsemide 10mg  qd, update CXR

## 2018-10-22 NOTE — Assessment & Plan Note (Signed)
Stable, continue Pantoprazole 40mg qd.  

## 2018-10-22 NOTE — Assessment & Plan Note (Signed)
His mood is stable, continue Duloxetine 20mg  qd, Mirtazapine 7.5mg  qd.

## 2018-10-22 NOTE — Assessment & Plan Note (Signed)
Controlled, continue Levothyroxine 163mcg qd, last TSH 2.03 08/27/18

## 2018-10-22 NOTE — Progress Notes (Signed)
Location:  [facility] Room Number: 29 Place of Service:  SNF [phone]) Provider:  Marlana Latus  NP  [name], MD  Patient Care Team: [name], MD as PCP - General (Internal Medicine) [name], MD as PCP - Cardiology (Cardiology) [name], MD as Attending Physician (Cardiology) Vevelyn Royals, MD as Consulting Physician (Ophthalmology) Franchot Gallo, MD as Consulting Physician (Urology) Tanda Rockers, MD as Consulting Physician (Pulmonary Disease) Allyn Kenner, MD (Dermatology) Deliah Goody, PA-C as Physician Assistant (Physician Assistant) Teejay Meader X, NP as Nurse Practitioner (Internal Medicine) Ngetich, Nelda Bucks, NP as Nurse Practitioner (Family Medicine)  Extended Emergency Contact Information Primary Emergency Contact: Glazier,[name] Address: [address], [place] Phone: [phone] Mobile Phone: [phone] Relation: Spouse Secondary Emergency Contact: [name],Gemmill  [place] Mobile Phone: [phone] Relation: Daughter  Code Status:  Full Code Goals of care: Advanced Directive information Advanced Directives 10/19/2018  Does Patient Have a Medical Advance Directive? Yes  Type of Paramedic of [place];Living will  Does patient want to make changes to medical advance directive? No - Patient declined  Copy of [place] in Chart? Yes - validated most recent copy scanned in chart (See row information)  Would patient like information on creating a medical advance directive? -  Pre-existing out of facility DNR order (yellow form or pink MOST form) -     Chief Complaint  Patient presents with  . Medical Management of Chronic Issues    HPI:  Pt is a 85 y.o. male seen today for medical management of chronic diseases.    The patient Hx of CHF/edema, on Torsemide 10mg  qd, c/o cough, DOE, denied chest pain/pressure,  or palpitation, last CXR 09/22/18 showed left base infiltrate. Hx of constipation, on Senokot S II bid, MiraLax prn. Trulance 3mg  qd.  RLS, stable on Requip 0.5mg  bid, daily prn. GERD stable, on Pantoprazole 40mg  qd. His mood is stable, on Duloxetine 20mg  qd, Mirtazapine 7.5mg  qd. Orthostatic hypotension, stable, on Midodrine 5mg  tid,   Droxidopa 100mg  tid. Chronic knee pain,  Hypothyroidism, on Levothyroxine 164mcg qd, last TSH 2.03 08/27/18.  Hx of V tachy, heart rate is in control, on Amiodarone 100mg  qd.    Past Medical History:  Diagnosis Date  . Arthritis    "minor, back and sometimes knees" (12/15/2012)  . Bradycardia    AFib/SSS s/p St Jude PPM 04/12/2008  . CAD (coronary artery disease) 12/30/2014   CABG (LIMA-LAD, SVG-RCA, SVG-OM in 1996).  07/2009 BMS to SVG-RCA. Cath in 04/2010 with patent stents   . Cardiomyopathy, ischemic 08/25/2012  . CHF (congestive heart failure) ([place] [facility])   . Chronic knee pain 12/03/2014  . Combined congestive systolic and diastolic heart failure ([facility]) 02/02/2015   Hx EF 41%. BNP 96.8 02/21/15 Torsemide 04/06/15 Na 142, K 4.6, Bun 16, creat 0.89 04/20/14 BNP 111.7, Na 142, K 4.6, Bun 16, creat 0.9   . Depression with anxiety 02/02/2015   02/21/15 Hgb A1c 5.8 03/10/15 MMSE 30/30   . Dizziness, after diuretic asscoiated with hypotension and responded to fluid bolus 06/05/2011   04/28/15 US carotid R+L normal bilateral arterial velocities.    Marland Kitchen Dyspnea 08/11/2014   Followed in Pulmonary clinic/ [facility]/ [place]  - 08/11/2014  Walked RA x 1 laps @ 185 ft each stopped due to fatigue/off balance/ slow pace/  no sob or desat  -  PFT's  09/26/2014  FEV1 2.26 (85 % ) ratio 76  p no % improvement from saba with DLCO  67 % corrects to 93 % for alv volume      Since prev study 08/04/13 minimal change lung vol or dlco    . Embolic cerebral infarction ([place]) 12/06/2015  . Exertional shortness of breath    "sometimes walking" (12/15/2012)  . GERD (gastroesophageal reflux disease)   . Gout  02/09/2015  . Heart murmur    "just told I had one today" (12/15/2012)  . Hiatal hernia   . Hyperlipidemia   . Hypertension   . Hypothyroid   . Influenza A 03/10/2016  . Insomnia   . Melanoma of back ([place]) 1976  . Myocardial infarction [facility]) 1996; 2011   "both silent" (12/15/2012)  . Nonrheumatic aortic valve stenosis   . Orthostatic hypotension   . Osteoporosis, senile   . Pacemaker   . RBBB   . Restless leg 02/02/2015  . Right leg weakness 12/06/2015  . [facility] spotted fever   . S/P CABG x 4   . Sick sinus syndrome ([place]) 01/31/2014  . Sustained ventricular tachycardia ([place]) 07/27/2014  . Urinary retention 12/30/2014   has a cathrater   Past Surgical History:  Procedure Laterality Date  . CARDIAC CATHETERIZATION  04/2010   LIMA to LAD patent,SVG to OM patent,no in-stnet restenosis RCA  . CATARACT EXTRACTION W/ INTRAOCULAR LENS  IMPLANT, BILATERAL Bilateral 2012  . CORONARY ANGIOPLASTY WITH STENT PLACEMENT  07/2009   bare metal stent to SVG to the RCA  . CORONARY ARTERY BYPASS GRAFT  1996   LIMA to LAD,SVG to RCA & SVG to OM  . INSERT / REPLACE / REMOVE PACEMAKER  2010  . MELANOMA EXCISION  05/1974 X2   "taken off my back" (12/15/2012)  . NM MYOVIEW LTD  06/2011   low risk  . PPM GENERATOR CHANGEOUT N/A 01/22/2017   Procedure: PPM GENERATOR CHANGEOUT;  Surgeon: [name], MD;  Location: [facility] CV LAB;  Service: Cardiovascular;  Laterality: N/A;  . TONSILLECTOMY  1938  . TRANSURETHRAL RESECTION OF PROSTATE  1986  . US ECHOCARDIOGRAPHY  07/11/2009   EF 45-50%    Allergies  Allergen Reactions  . Altace [Ramipril] Cough  . Crestor [Rosuvastatin Calcium] Rash  . Penicillins Rash and Other (See Comments)    Has patient had a PCN reaction causing immediate rash, facial/tongue/throat swelling, SOB or lightheadedness with hypotension: Yes Has patient had a PCN reaction causing severe rash involving mucus membranes or skin necrosis: No Has patient had a PCN  reaction that required hospitalization No Has patient had a PCN reaction occurring within the last 10 years: No If all of the above answers are "NO", then may proceed with Cephalosporin use.   . Sulfa Antibiotics Rash  . Sulfamethoxazole-Trimethoprim Rash    Outpatient Encounter Medications as of 10/22/2018  Medication Sig  . acetaminophen (TYLENOL) 500 MG tablet Take 500 mg by mouth 3 (three) times daily.   . Amino Acids-Protein Hydrolys (FEEDING SUPPLEMENT, PRO-STAT SUGAR FREE 64,) LIQD Take 30 mLs by mouth 2 (two) times a day.   Marland Kitchen amiodarone (PACERONE) 100 MG tablet Take 1 tablet (100 mg total) by mouth daily.  Marland Kitchen aspirin EC 81 MG tablet Take 1 tablet (81 mg total) by mouth daily.  . Cholecalciferol (VITAMIN D3) 5000 UNITS CAPS Take 5,000 Units by mouth every Monday.   . clopidogrel (PLAVIX) 75 MG tablet TAKE 1 TABLET ONCE DAILY.  Marland Kitchen  clotrimazole-betamethasone (LOTRISONE) cream Apply 1 application topically daily as needed.  . Droxidopa 100 MG CAPS Take 100 mg by mouth 3 (three) times daily.  . DULoxetine (CYMBALTA) 20 MG capsule Take 20 mg by mouth daily. For anxiety,insomnia and back pain  . fluticasone (CUTIVATE) 0.05 % cream Apply topically 2 (two) times daily as needed. Apply small amount on  face and back also can apply as needed  . fluticasone (FLONASE) 50 MCG/ACT nasal spray Place 1 spray into both nostrils 2 (two) times daily.  Marland Kitchen guaiFENesin (ROBITUSSIN) 100 MG/5ML SOLN Take 5 mLs (100 mg total) by mouth every 4 (four) hours as needed for cough or to loosen phlegm.  Marland Kitchen levothyroxine (SYNTHROID, LEVOTHROID) 100 MCG tablet Take 100 mcg by mouth daily before breakfast.  . meclizine (ANTIVERT) 25 MG tablet Take 1 tablet (25 mg total) by mouth 2 (two) times daily as needed for dizziness.  . Melatonin 3 MG TABS Take 3 mg by mouth at bedtime.  . methocarbamol (ROBAXIN) 500 MG tablet Take 250 mg by mouth 2 (two) times daily.   . midodrine (PROAMATINE) 5 MG tablet Take 5 mg by mouth 3  (three) times daily with meals. Notify provider if BP is < 90/60.  Marland Kitchen mirtazapine (REMERON) 7.5 MG tablet Take 7.5 mg by mouth at bedtime.  . Multiple Vitamins-Minerals (DECUBI-VITE) CAPS Take 1 capsule by mouth daily.  . Nutritional Supplements (FEEDING SUPPLEMENT, BOOST BREEZE,) LIQD Take by mouth 2 (two) times daily.  . pantoprazole (PROTONIX) 40 MG tablet TAKE 1 TABLET BY MOUTH TWICE DAILY.  Marland Kitchen Plecanatide (TRULANCE) 3 MG TABS Take 3 mg by mouth daily.  . polyethylene glycol (MIRALAX / GLYCOLAX) 17 g packet Take 17 g by mouth daily as needed. Take 1-2 capfuls in 8 oz of H2O  . pravastatin (PRAVACHOL) 40 MG tablet Take 1 tablet (40 mg total) by mouth at bedtime.  Marland Kitchen rOPINIRole (REQUIP) 0.25 MG tablet Take 0.5 mg by mouth 2 (two) times daily.  Marland Kitchen rOPINIRole (REQUIP) 0.5 MG tablet Take 0.5 mg by mouth daily as needed.  . sennosides-docusate sodium (SENOKOT-S) 8.6-50 MG tablet Take 2 tablets by mouth 2 (two) times daily.   Marland Kitchen torsemide (DEMADEX) 10 MG tablet Take 1 tablet (10 mg total) by mouth daily.   No facility-administered encounter medications on file as of 10/22/2018.   ROS was provided with assistance of staff.   Review of Systems  Constitutional: Positive for fatigue. Negative for activity change, appetite change, chills, diaphoresis and fever.  HENT: Positive for hearing loss. Negative for congestion and voice change.   Respiratory: Positive for cough and shortness of breath. Negative for wheezing.   Cardiovascular: Positive for leg swelling. Negative for chest pain and palpitations.  Gastrointestinal: Negative for abdominal distention, abdominal pain, constipation, diarrhea, nausea and vomiting.  Genitourinary:       Foley  Musculoskeletal: Positive for arthralgias and gait problem.  Skin: Negative for color change and pallor.  Neurological: Positive for speech difficulty. Negative for dizziness, weakness and headaches.       Memory lapses. Expressive aphasia.    Psychiatric/Behavioral: Negative for agitation, behavioral problems, hallucinations and sleep disturbance. The patient is not nervous/anxious.     Immunization History  Administered Date(s) Administered  . Influenza, High Dose Seasonal PF 11/01/2014, 11/13/2016  . Influenza,inj,Quad PF,6+ Mos 11/24/2017  . Influenza,inj,quad, With Preservative 11/17/2017  . Influenza-Unspecified 12/05/2013, 11/05/2014, 11/16/2015  . PPD Test 03/07/2014  . Pneumococcal Conjugate-13 10/05/2009  . Pneumococcal Polysaccharide-23 11/05/2005  . Pneumococcal-Unspecified 10/05/2009  .  Tdap 08/02/2014  . Zoster 11/05/2005   Pertinent  Health Maintenance Due  Topic Date Due  . INFLUENZA VACCINE  11/12/2018 (Originally 09/05/2018)  . PNA vac Low Risk Adult (2 of 2 - PCV13) 11/12/2018 (Originally 10/06/2010)  . DEXA SCAN  12/14/2024   Fall Risk  10/09/2017 10/02/2016 10/30/2015 09/05/2015 02/02/2015  Falls in the past year? No No No No No  Risk for fall due to : - - Impaired balance/gait - -   Functional Status Survey:    Vitals:   10/22/18 0910  BP: 124/80  Pulse: 71  Resp: 19  Temp: (!) 97.3 F (36.3 C)  SpO2: 95%  Weight: 179 lb 8 oz (81.4 kg)  Height: 5\' 10"  (1.778 m)   Body mass index is 25.76 kg/m. Physical Exam Vitals signs and nursing note reviewed.  Constitutional:      General: He is not in acute distress.    Appearance: Normal appearance. He is normal weight. He is not ill-appearing, toxic-appearing or diaphoretic.  HENT:     Head: Normocephalic and atraumatic.     Nose: Nose normal.     Mouth/Throat:     Mouth: Mucous membranes are moist.  Eyes:     Extraocular Movements: Extraocular movements intact.     Conjunctiva/sclera: Conjunctivae normal.     Pupils: Pupils are equal, round, and reactive to light.  Cardiovascular:     Rate and Rhythm: Normal rate and regular rhythm.     Heart sounds: Murmur present.  Pulmonary:     Breath sounds: No wheezing, rhonchi or rales.  Abdominal:      General: Bowel sounds are normal. There is no distension.     Palpations: Abdomen is soft.     Tenderness: There is no abdominal tenderness. There is no right CVA tenderness, left CVA tenderness, guarding or rebound.  Musculoskeletal:     Right lower leg: Edema present.     Left lower leg: Edema present.     Comments: Minimal edema BLE  Skin:    General: Skin is warm and dry.  Neurological:     General: No focal deficit present.     Mental Status: He is alert. Mental status is at baseline.     Cranial Nerves: No cranial nerve deficit.     Motor: No weakness.     Coordination: Coordination normal.     Gait: Gait abnormal.     Comments: Oriented to person, place.   Psychiatric:        Mood and Affect: Mood normal.        Behavior: Behavior normal.        Thought Content: Thought content normal.     Labs reviewed: Recent Labs    11/25/17 0024  01/15/18 1207 01/18/18 1657 01/19/18 05/09/18 09/23/18  NA 138   < > 139 139 139 138 141  K 4.1   < > 4.1 3.8 4.6 3.6 4.1  CL 104  --  101 104  --   --   --   CO2 26  --  24 26  --   --   --   GLUCOSE 112*  --  100* 127*  --   --   --   BUN 12   < > 15 17 16 18 18   CREATININE 0.65   < > 0.75* 0.73 0.7 0.8 0.7  CALCIUM 8.6*  --  8.9 8.3*  --   --   --    < > = values  in this interval not displayed.   Recent Labs    11/11/17 1800 12/30/17 01/18/18 1657 05/09/18 09/23/18  AST 17 21 20 18 23   ALT 12 12 13 15 14   ALKPHOS 75 92 91  --  64  BILITOT 0.3  --  0.5  --   --   PROT 6.3*  --  6.7  --   --   ALBUMIN 2.8*  --  3.2*  --   --    Recent Labs    11/11/17 1350  11/25/17 0024 01/18/18 1657 05/09/18 09/23/18  WBC 10.3   < > 11.8* 7.9 10.8 6.2  NEUTROABS  --   --  8.8* 5.4  --   --   HGB 12.4*   < > 12.5* 11.6* 11.5* 12.7*  HCT 40.0   < > 39.0 36.9* 33* 38*  MCV 97.8  --  98.2 98.4  --   --   PLT 300   < > 284 260 202 234   < > = values in this interval not displayed.   Lab Results  Component Value Date   TSH 2.03  08/27/2018   Lab Results  Component Value Date   HGBA1C 5.6 12/07/2015   Lab Results  Component Value Date   CHOL 93 03/30/2018   HDL 35 03/30/2018   LDLCALC 37 03/30/2018   TRIG 127 03/30/2018   CHOLHDL 2.6 05/05/2017    Significant Diagnostic Results in last 30 days:  No results found.  Assessment/Plan Ventricular tachycardia (HCC) Heart rate is in control, continue Amiodarone 100mg  qd.   Orthostatic hypotension Stable, continue Midodrine 5mg  tid, Droxidopa 100mg  tid.   Combined congestive systolic and diastolic heart failure (HCC) Hx of EF 41%, c/o cough, SOB, denied chest pain/pressure, or palpitation, last CXR 09/22/18 showed left base infiltrate. Continue Torsemide 10mg  qd, update CXR  Chronic constipation Stable, continue Senokot S II bid, prn MiraLax, Trulance 3mg  qd.   GERD (gastroesophageal reflux disease) Stable, continue Pantoprazole 40mg  qd.   Hypothyroidism Controlled, continue Levothyroxine 176mcg qd, last TSH 2.03 08/27/18  Urinary retention Foley  RLS (restless legs syndrome) Stable, continue Requip 0.5mg  bid, daily prn.   Depression with anxiety His mood is stable, continue Duloxetine 20mg  qd, Mirtazapine 7.5mg  qd.   Chronic knee pain stable, continue Methocarbamol 250mg  bid, Tylenol 500mg  tid.     Family/ staff Communication: plan of care reviewed with the patient and charge nurse.   Labs/tests ordered:  CXR  Time spend 25 minutes.

## 2018-10-22 NOTE — Assessment & Plan Note (Addendum)
Stable, continue Midodrine 5mg  tid, Droxidopa 100mg  tid.

## 2018-10-22 NOTE — Assessment & Plan Note (Signed)
stable, continue Methocarbamol 250mg  bid, Tylenol 500mg  tid.

## 2018-10-22 NOTE — Assessment & Plan Note (Signed)
Stable, continue Requip 0.5mg  bid, daily prn.

## 2018-11-05 ENCOUNTER — Encounter: Payer: Medicare Other | Admitting: *Deleted

## 2018-11-09 ENCOUNTER — Encounter: Payer: Medicare Other | Admitting: Cardiovascular Disease

## 2018-11-09 ENCOUNTER — Other Ambulatory Visit: Payer: Self-pay | Admitting: Cardiovascular Disease

## 2018-11-10 ENCOUNTER — Ambulatory Visit (INDEPENDENT_AMBULATORY_CARE_PROVIDER_SITE_OTHER): Payer: Medicare Other | Admitting: *Deleted

## 2018-11-10 DIAGNOSIS — I472 Ventricular tachycardia, unspecified: Secondary | ICD-10-CM

## 2018-11-10 DIAGNOSIS — I495 Sick sinus syndrome: Secondary | ICD-10-CM

## 2018-11-11 ENCOUNTER — Encounter: Payer: Self-pay | Admitting: Cardiology

## 2018-11-12 LAB — CUP PACEART REMOTE DEVICE CHECK
Battery Remaining Longevity: 106 mo
Battery Remaining Percentage: 95.5 %
Battery Voltage: 2.96 V
Brady Statistic AP VP Percent: 34 %
Brady Statistic AP VS Percent: 65 %
Brady Statistic AS VP Percent: 1 %
Brady Statistic AS VS Percent: 1.1 %
Brady Statistic RA Percent Paced: 97 %
Brady Statistic RV Percent Paced: 34 %
Date Time Interrogation Session: 20201007011907
Implantable Lead Implant Date: 20100309
Implantable Lead Implant Date: 20100309
Implantable Lead Location: 753859
Implantable Lead Location: 753860
Implantable Pulse Generator Implant Date: 20181219
Lead Channel Impedance Value: 400 Ohm
Lead Channel Impedance Value: 490 Ohm
Lead Channel Pacing Threshold Amplitude: 0.625 V
Lead Channel Pacing Threshold Amplitude: 0.75 V
Lead Channel Pacing Threshold Pulse Width: 0.4 ms
Lead Channel Pacing Threshold Pulse Width: 0.4 ms
Lead Channel Sensing Intrinsic Amplitude: 10.6 mV
Lead Channel Sensing Intrinsic Amplitude: 2.5 mV
Lead Channel Setting Pacing Amplitude: 0.875
Lead Channel Setting Pacing Amplitude: 2.5 V
Lead Channel Setting Pacing Pulse Width: 0.4 ms
Lead Channel Setting Sensing Sensitivity: 2 mV
Pulse Gen Model: 2272
Pulse Gen Serial Number: 8965776

## 2018-11-17 ENCOUNTER — Encounter: Payer: Self-pay | Admitting: Nurse Practitioner

## 2018-11-17 ENCOUNTER — Non-Acute Institutional Stay (SKILLED_NURSING_FACILITY): Payer: Medicare Other | Admitting: Nurse Practitioner

## 2018-11-17 DIAGNOSIS — I5040 Unspecified combined systolic (congestive) and diastolic (congestive) heart failure: Secondary | ICD-10-CM

## 2018-11-17 DIAGNOSIS — K5909 Other constipation: Secondary | ICD-10-CM | POA: Diagnosis not present

## 2018-11-17 DIAGNOSIS — I472 Ventricular tachycardia, unspecified: Secondary | ICD-10-CM

## 2018-11-17 DIAGNOSIS — R339 Retention of urine, unspecified: Secondary | ICD-10-CM

## 2018-11-17 DIAGNOSIS — M159 Polyosteoarthritis, unspecified: Secondary | ICD-10-CM

## 2018-11-17 DIAGNOSIS — G2581 Restless legs syndrome: Secondary | ICD-10-CM

## 2018-11-17 DIAGNOSIS — E032 Hypothyroidism due to medicaments and other exogenous substances: Secondary | ICD-10-CM

## 2018-11-17 DIAGNOSIS — G903 Multi-system degeneration of the autonomic nervous system: Secondary | ICD-10-CM

## 2018-11-17 DIAGNOSIS — F418 Other specified anxiety disorders: Secondary | ICD-10-CM

## 2018-11-17 NOTE — Assessment & Plan Note (Signed)
heart rate is in control, continue amiodarone 100mg  qd.

## 2018-11-17 NOTE — Assessment & Plan Note (Signed)
pain is managed, continue Tylenol 500mg  tid, Methocarbamol 250mg  bid.

## 2018-11-17 NOTE — Assessment & Plan Note (Signed)
Stable, continue Levothyroxine 168mcg qd, last TSH 2.03 08/27/18

## 2018-11-17 NOTE — Assessment & Plan Note (Signed)
stable, continue Torsemide 10mg  qd.

## 2018-11-17 NOTE — Progress Notes (Signed)
Location:   [facility] Room Number: 34 Place of Service:  SNF (31) Provider:  Ripley Bogosian NP  [name], MD  Patient Care Team: [name], MD as PCP - General (Internal Medicine) [name], MD as PCP - Cardiology (Cardiology) [name], MD as Attending Physician (Cardiology) Vevelyn Royals, MD as Consulting Physician (Ophthalmology) Franchot Gallo, MD as Consulting Physician (Urology) Tanda Rockers, MD as Consulting Physician (Pulmonary Disease) Allyn Kenner, MD (Dermatology) Deliah Goody, PA-C as Physician Assistant (Physician Assistant) Bernie Ransford X, NP as Nurse Practitioner (Internal Medicine) Ngetich, Nelda Bucks, NP as Nurse Practitioner (Family Medicine)  Extended Emergency Contact Information Primary Emergency Contact: Watson,[name] Address: [place], [place] Phone: [phone] Mobile Phone: [phone] Relation: Spouse Secondary Emergency Contact: [name],Sherley  [place] Mobile Phone: [phone] Relation: Daughter  Code Status:  Full Code Goals of care: Advanced Directive information Advanced Directives 11/17/2018  Does Patient Have a Medical Advance Directive? Yes  Type of Paramedic of [place];Living will  Does patient want to make changes to medical advance directive? No - Patient declined  Copy of [place] in Chart? Yes - validated most recent copy scanned in chart (See row information)  Would patient like information on creating a medical advance directive? -  Pre-existing out of facility DNR order (yellow form or pink MOST form) -     Chief Complaint  Patient presents with  . Medical Management of Chronic Issues  . Health Maintenance    PNA & Influenza vaccine    HPI:  Pt is a 85 y.o. male seen today for medical management of chronic diseases.     The patient resides in [facility] for safety, care  assistance, w/c for mobility. Hx of multiple sites OA, pain is managed with Tylenol 500mg  tid, Methocarbamol 250mg  bid. VT heart rate is in control, on amiodarone 100mg  qd. RLS, stable, on Requip 0.5 mg bid, daily prn.  his mood is stable, on Mirtazapine 7.5mg  qd, Duloxetine 20mg  qd. Hypothyroidism, stable, on Levothyroxine 160mcg qd. Orthostatic hypotension, stable, on Midodrine 5mg  tid, on Droxidopa 100mg  tid. Constipation, stable, on Senokot S II bid. CHF/edema BLE, stable, on Torsemide 10mg  qd.  Past Medical History:  Diagnosis Date  . Arthritis    "minor, back and sometimes knees" (12/15/2012)  . Bradycardia    AFib/SSS s/p St Jude PPM 04/12/2008  . CAD (coronary artery disease) 12/30/2014   CABG (LIMA-LAD, SVG-RCA, SVG-OM in 1996).  07/2009 BMS to SVG-RCA. Cath in 04/2010 with patent stents   . Cardiomyopathy, ischemic 08/25/2012  . CHF (congestive heart failure) ([place])   . Chronic knee pain 12/03/2014  . Combined congestive systolic and diastolic heart failure ([place]) 02/02/2015   Hx EF 41%. BNP 96.8 02/21/15 Torsemide 04/06/15 Na 142, K 4.6, Bun 16, creat 0.89 04/20/14 BNP 111.7, Na 142, K 4.6, Bun 16, creat 0.9   . Depression with anxiety 02/02/2015   02/21/15 Hgb A1c 5.8 03/10/15 MMSE 30/30   . Dizziness, after diuretic asscoiated with hypotension and responded to fluid bolus 06/05/2011   04/28/15 US carotid R+L normal bilateral arterial velocities.    Marland Kitchen Dyspnea 08/11/2014   Followed in Pulmonary clinic/ [facility]/ [place]  - 08/11/2014  Walked RA x 1 laps @ 185 ft each stopped due to fatigue/off balance/ slow pace/  no sob or desat  - PFT's  09/26/2014  FEV1 2.26 (85 % ) ratio 76  p no % improvement from saba with DLCO  67 % corrects to 93 % for alv volume      Since prev study 08/04/13 minimal change lung vol or dlco    . Embolic cerebral infarction ([place]) 12/06/2015  . Exertional shortness of breath    "sometimes walking" (12/15/2012)  . GERD (gastroesophageal reflux disease)   . Gout 02/09/2015  .  Heart murmur    "just told I had one today" (12/15/2012)  . Hiatal hernia   . Hyperlipidemia   . Hypertension   . Hypothyroid   . Influenza A 03/10/2016  . Insomnia   . Melanoma of back ([place]) 1976  . Myocardial infarction [facility]) 1996; 2011   "both silent" (12/15/2012)  . Nonrheumatic aortic valve stenosis   . Orthostatic hypotension   . Osteoporosis, senile   . Pacemaker   . RBBB   . Restless leg 02/02/2015  . Right leg weakness 12/06/2015  . [facility] spotted fever   . S/P CABG x 4   . Sick sinus syndrome ([place]) 01/31/2014  . Sustained ventricular tachycardia ([place]) 07/27/2014  . Urinary retention 12/30/2014   has a cathrater   Past Surgical History:  Procedure Laterality Date  . CARDIAC CATHETERIZATION  04/2010   LIMA to LAD patent,SVG to OM patent,no in-stnet restenosis RCA  . CATARACT EXTRACTION W/ INTRAOCULAR LENS  IMPLANT, BILATERAL Bilateral 2012  . CORONARY ANGIOPLASTY WITH STENT PLACEMENT  07/2009   bare metal stent to SVG to the RCA  . CORONARY ARTERY BYPASS GRAFT  1996   LIMA to LAD,SVG to RCA & SVG to OM  . INSERT / REPLACE / REMOVE PACEMAKER  2010  . MELANOMA EXCISION  05/1974 X2   "taken off my back" (12/15/2012)  . NM MYOVIEW LTD  06/2011   low risk  . PPM GENERATOR CHANGEOUT N/A 01/22/2017   Procedure: PPM GENERATOR CHANGEOUT;  Surgeon: [name], MD;  Location: [facility];  Service: Cardiovascular;  Laterality: N/A;  . TONSILLECTOMY  1938  . TRANSURETHRAL RESECTION OF PROSTATE  1986  . US ECHOCARDIOGRAPHY  07/11/2009   EF 45-50%    Allergies  Allergen Reactions  . Altace [Ramipril] Cough  . Crestor [Rosuvastatin Calcium] Rash  . Penicillins Rash and Other (See Comments)    Has patient had a PCN reaction causing immediate rash, facial/tongue/throat swelling, SOB or lightheadedness with hypotension: Yes Has patient had a PCN reaction causing severe rash involving mucus membranes or skin necrosis: No Has patient had a PCN reaction that  required hospitalization No Has patient had a PCN reaction occurring within the last 10 years: No If all of the above answers are "NO", then may proceed with Cephalosporin use.   . Sulfa Antibiotics Rash  . Sulfamethoxazole-Trimethoprim Rash    Allergies as of 11/17/2018      Reactions   Altace [ramipril] Cough   Crestor [rosuvastatin Calcium] Rash   Penicillins Rash, Other (See Comments)   Has patient had a PCN reaction causing immediate rash, facial/tongue/throat swelling, SOB or lightheadedness with hypotension: Yes Has patient had a PCN reaction causing severe rash involving mucus membranes or skin necrosis: No Has patient had a PCN reaction that required hospitalization No Has patient had a PCN reaction occurring within the last 10 years: No If all of the above answers are "NO", then may proceed with Cephalosporin use.   Sulfa Antibiotics Rash   Sulfamethoxazole-trimethoprim Rash      Medication  List       Accurate as of November 17, 2018  3:13 PM. If you have any questions, ask your nurse or doctor.        acetaminophen 500 MG tablet Commonly known as: TYLENOL Take 500 mg by mouth 3 (three) times daily.   amiodarone 100 MG tablet Commonly known as: PACERONE Take 1 tablet (100 mg total) by mouth daily.   aspirin EC 81 MG tablet Take 1 tablet (81 mg total) by mouth daily.   clopidogrel 75 MG tablet Commonly known as: PLAVIX TAKE 1 TABLET ONCE DAILY.   clotrimazole-betamethasone cream Commonly known as: LOTRISONE Apply 1 application topically daily as needed.   Decubi-Vite Caps Take 1 capsule by mouth daily.   Droxidopa 100 MG Caps Take 100 mg by mouth 3 (three) times daily.   DULoxetine 20 MG capsule Commonly known as: CYMBALTA Take 20 mg by mouth daily. For anxiety,insomnia and back pain   feeding supplement (BOOST BREEZE) Liqd Take by mouth 2 (two) times daily.   feeding supplement (PRO-STAT SUGAR FREE 64) Liqd Take 30 mLs by mouth 2 (two) times a  day.   fluticasone 0.05 % cream Commonly known as: CUTIVATE Apply topically 2 (two) times daily as needed. Apply small amount on  face and back also can apply as needed   fluticasone 50 MCG/ACT nasal spray Commonly known as: FLONASE Place 1 spray into both nostrils 2 (two) times daily.   guaiFENesin 100 MG/5ML Soln Commonly known as: ROBITUSSIN Take 5 mLs (100 mg total) by mouth every 4 (four) hours as needed for cough or to loosen phlegm.   ketoconazole 2 % cream Commonly known as: NIZORAL Apply 1 application topically. Apply small amount behind ears and in creases beside nose BID x 7 days. May repeat PRN. (Family supplied).   levothyroxine 100 MCG tablet Commonly known as: SYNTHROID Take 100 mcg by mouth daily before breakfast.   meclizine 25 MG tablet Commonly known as: ANTIVERT Take 1 tablet (25 mg total) by mouth 2 (two) times daily as needed for dizziness.   Melatonin 3 MG Tabs Take 3 mg by mouth at bedtime.   methocarbamol 500 MG tablet Commonly known as: ROBAXIN Take 250 mg by mouth 2 (two) times daily.   midodrine 5 MG tablet Commonly known as: PROAMATINE Take 5 mg by mouth 3 (three) times daily with meals. Notify provider if BP is < 90/60.   mirtazapine 7.5 MG tablet Commonly known as: REMERON Take 7.5 mg by mouth at bedtime.   pantoprazole 40 MG tablet Commonly known as: PROTONIX TAKE 1 TABLET BY MOUTH TWICE DAILY.   Plecanatide 3 MG Tabs Commonly known as: Trulance Take 3 mg by mouth daily.   polyethylene glycol 17 g packet Commonly known as: MIRALAX / GLYCOLAX Take 17 g by mouth daily as needed. Take 1-2 capfuls in 8 oz of H2O   pravastatin 40 MG tablet Commonly known as: PRAVACHOL Take 1 tablet (40 mg total) by mouth at bedtime.   rOPINIRole 0.25 MG tablet Commonly known as: REQUIP Take 0.5 mg by mouth 2 (two) times daily.   rOPINIRole 0.5 MG tablet Commonly known as: REQUIP Take 0.5 mg by mouth daily as needed.   sennosides-docusate  sodium 8.6-50 MG tablet Commonly known as: SENOKOT-S Take 2 tablets by mouth 2 (two) times daily.   torsemide 10 MG tablet Commonly known as: DEMADEX Take 1 tablet (10 mg total) by mouth daily.   Vitamin D3 125 MCG (5000 UT) Caps Take  5,000 Units by mouth every Monday.      ROS was provided with assistance of staff.  Review of Systems  Constitutional: Negative for activity change, appetite change, chills, diaphoresis, fatigue, fever and unexpected weight change.  HENT: Positive for hearing loss. Negative for congestion and voice change.   Respiratory: Negative for cough, shortness of breath and wheezing.   Cardiovascular: Positive for leg swelling. Negative for chest pain and palpitations.  Gastrointestinal: Negative for abdominal distention, abdominal pain, constipation, diarrhea and nausea.  Genitourinary: Positive for difficulty urinating. Negative for hematuria.       Foley  Musculoskeletal: Positive for arthralgias and gait problem.  Skin: Negative for color change and pallor.  Neurological: Negative for dizziness, speech difficulty, weakness and headaches.       Memory lapses. Expressive aphasia.  Psychiatric/Behavioral: Negative for agitation, behavioral problems, hallucinations and sleep disturbance. The patient is not nervous/anxious.     Immunization History  Administered Date(s) Administered  . Influenza, High Dose Seasonal PF 11/01/2014, 11/13/2016  . Influenza,inj,Quad PF,6+ Mos 11/24/2017  . Influenza,inj,quad, With Preservative 11/17/2017  . Influenza-Unspecified 12/05/2013, 11/05/2014, 11/16/2015  . PPD Test 03/07/2014  . Pneumococcal Conjugate-13 10/05/2009  . Pneumococcal Polysaccharide-23 11/05/2005  . Pneumococcal-Unspecified 10/05/2009  . Tdap 08/02/2014  . Zoster 11/05/2005   Pertinent  Health Maintenance Due  Topic Date Due  . PNA vac Low Risk Adult (2 of 2 - PCV13) 10/06/2010  . INFLUENZA VACCINE  09/05/2018  . DEXA SCAN  12/14/2024   Fall Risk   10/09/2017 10/02/2016 10/30/2015 09/05/2015 02/02/2015  Falls in the past year? No No No No No  Risk for fall due to : - - Impaired balance/gait - -   Functional Status Survey:    Vitals:   11/17/18 1349  BP: 118/64  Pulse: 72  Resp: (!) 22  Temp: 97.6 F (36.4 C)  SpO2: 96%  Weight: 178 lb 11.2 oz (81.1 kg)  Height: 5\' 10"  (1.778 m)   Body mass index is 25.64 kg/m. Physical Exam Vitals signs and nursing note reviewed.  Constitutional:      General: He is not in acute distress.    Appearance: Normal appearance. He is normal weight. He is not ill-appearing, toxic-appearing or diaphoretic.  HENT:     Head: Normocephalic and atraumatic.     Nose: Nose normal.     Mouth/Throat:     Mouth: Mucous membranes are moist.  Eyes:     Extraocular Movements: Extraocular movements intact.     Conjunctiva/sclera: Conjunctivae normal.     Pupils: Pupils are equal, round, and reactive to light.  Neck:     Musculoskeletal: Normal range of motion and neck supple.  Cardiovascular:     Rate and Rhythm: Normal rate and regular rhythm.     Heart sounds: Murmur present.  Pulmonary:     Breath sounds: No wheezing, rhonchi or rales.  Abdominal:     General: Bowel sounds are normal. There is no distension.     Palpations: Abdomen is soft.     Tenderness: There is no abdominal tenderness. There is no right CVA tenderness, left CVA tenderness, guarding or rebound.  Musculoskeletal:     Right lower leg: Edema present.     Left lower leg: Edema present.     Comments: Minimal edema BLE  Skin:    General: Skin is warm and dry.  Neurological:     General: No focal deficit present.     Mental Status: He is alert. Mental status is  at baseline.     Cranial Nerves: No cranial nerve deficit.     Motor: No weakness.     Coordination: Coordination abnormal.     Gait: Gait abnormal.     Comments: Oriented to person, place.  Psychiatric:        Mood and Affect: Mood normal.        Behavior: Behavior  normal.        Thought Content: Thought content normal.     Labs reviewed: Recent Labs    11/25/17 0024  01/15/18 1207 01/18/18 1657 01/19/18 05/09/18 09/23/18  NA 138   < > 139 139 139 138 141  K 4.1   < > 4.1 3.8 4.6 3.6 4.1  CL 104  --  101 104  --   --   --   CO2 26  --  24 26  --   --   --   GLUCOSE 112*  --  100* 127*  --   --   --   BUN 12   < > 15 17 16 18 18   CREATININE 0.65   < > 0.75* 0.73 0.7 0.8 0.7  CALCIUM 8.6*  --  8.9 8.3*  --   --   --    < > = values in this interval not displayed.   Recent Labs    12/30/17 01/18/18 1657 05/09/18 09/23/18  AST 21 20 18 23   ALT 12 13 15 14   ALKPHOS 92 91  --  64  BILITOT  --  0.5  --   --   PROT  --  6.7  --   --   ALBUMIN  --  3.2*  --   --    Recent Labs    11/25/17 0024 01/18/18 1657 05/09/18 09/23/18  WBC 11.8* 7.9 10.8 6.2  NEUTROABS 8.8* 5.4  --   --   HGB 12.5* 11.6* 11.5* 12.7*  HCT 39.0 36.9* 33* 38*  MCV 98.2 98.4  --   --   PLT 284 260 202 234   Lab Results  Component Value Date   TSH 2.03 08/27/2018   Lab Results  Component Value Date   HGBA1C 5.6 12/07/2015   Lab Results  Component Value Date   CHOL 93 03/30/2018   HDL 35 03/30/2018   LDLCALC 37 03/30/2018   TRIG 127 03/30/2018   CHOLHDL 2.6 05/05/2017    Significant Diagnostic Results in last 30 days:  No results found.  Assessment/Plan Neurogenic orthostatic hypotension (HCC)  stable, continue  Midodrine 5mg  tid, Droxidopa 100mg  tid.  Ventricular tachycardia (HCC) heart rate is in control, continue amiodarone 100mg  qd.   Combined congestive systolic and diastolic heart failure (HCC) stable, continue Torsemide 10mg  qd.    Chronic constipation  stable, continue Senokot S II bid.  Osteoarthritis  pain is managed, continue Tylenol 500mg  tid, Methocarbamol 250mg  bid.  Hypothyroidism Stable, continue Levothyroxine 161mcg qd, last TSH 2.03 08/27/18  Urinary retention Foley   RLS (restless legs syndrome) Stable, continue  Requip  Depression with anxiety His mood is stable, continue Mirtazapine 7.5mg  qd, Duloxetine 20mg  qd.     Family/ staff Communication: plan of care reviewed with the patient and charge nurse.   Labs/tests ordered:  none  Time spend 25 minutes.

## 2018-11-17 NOTE — Assessment & Plan Note (Signed)
stable, continue  Senokot S II bid.   

## 2018-11-17 NOTE — Assessment & Plan Note (Signed)
Stable, continue Requip

## 2018-11-17 NOTE — Assessment & Plan Note (Signed)
Foley

## 2018-11-17 NOTE — Assessment & Plan Note (Signed)
stable, continue  Midodrine 5mg  tid, Droxidopa 100mg  tid.

## 2018-11-17 NOTE — Assessment & Plan Note (Signed)
His mood is stable, continue Mirtazapine 7.5mg  qd, Duloxetine 20mg  qd.

## 2018-11-18 NOTE — Progress Notes (Signed)
Remote pacemaker transmission.   

## 2018-11-25 ENCOUNTER — Telehealth: Payer: Self-pay | Admitting: Cardiovascular Disease

## 2018-11-25 NOTE — Telephone Encounter (Signed)
Wife would like to come with patient on 11/30/18 with Dr. Sallyanne Kuster to help assist him with getting around.

## 2018-11-25 NOTE — Telephone Encounter (Signed)
Routed to primary nurse 

## 2018-11-26 NOTE — Telephone Encounter (Signed)
The wife has been made aware that she may come to the appointment to help her husband.

## 2018-11-30 ENCOUNTER — Other Ambulatory Visit: Payer: Self-pay

## 2018-11-30 ENCOUNTER — Ambulatory Visit (INDEPENDENT_AMBULATORY_CARE_PROVIDER_SITE_OTHER): Payer: Medicare Other | Admitting: Cardiovascular Disease

## 2018-11-30 VITALS — BP 117/72 | HR 70 | Ht 70.0 in | Wt 177.0 lb

## 2018-11-30 DIAGNOSIS — E7800 Pure hypercholesterolemia, unspecified: Secondary | ICD-10-CM

## 2018-11-30 DIAGNOSIS — Z8673 Personal history of transient ischemic attack (TIA), and cerebral infarction without residual deficits: Secondary | ICD-10-CM

## 2018-11-30 DIAGNOSIS — I472 Ventricular tachycardia, unspecified: Secondary | ICD-10-CM

## 2018-11-30 DIAGNOSIS — I951 Orthostatic hypotension: Secondary | ICD-10-CM

## 2018-11-30 DIAGNOSIS — Z950 Presence of cardiac pacemaker: Secondary | ICD-10-CM | POA: Diagnosis not present

## 2018-11-30 DIAGNOSIS — I495 Sick sinus syndrome: Secondary | ICD-10-CM | POA: Diagnosis not present

## 2018-11-30 DIAGNOSIS — I25118 Atherosclerotic heart disease of native coronary artery with other forms of angina pectoris: Secondary | ICD-10-CM

## 2018-11-30 DIAGNOSIS — Z5181 Encounter for therapeutic drug level monitoring: Secondary | ICD-10-CM | POA: Diagnosis not present

## 2018-11-30 DIAGNOSIS — I350 Nonrheumatic aortic (valve) stenosis: Secondary | ICD-10-CM

## 2018-11-30 DIAGNOSIS — Z79899 Other long term (current) drug therapy: Secondary | ICD-10-CM

## 2018-11-30 DIAGNOSIS — I5042 Chronic combined systolic (congestive) and diastolic (congestive) heart failure: Secondary | ICD-10-CM

## 2018-11-30 LAB — PACEMAKER DEVICE OBSERVATION

## 2018-11-30 NOTE — Progress Notes (Signed)
Patient ID: Adam [name], male   DOB: Jan 02, 1927, 85 y.o.   MRN: [id number]    Cardiology Office Note    Date:  12/03/2018   ID:  Adam [name], DOB Jun 19, 1926, MRN [id number]  PCP:  [name], MD  Cardiologist:   [name], MD   Chief complaint: pacemaker, orthostatic hypotension  History of Present Illness:  Adam [name] is a 85 y.o. male with a long-standing history of coronary artery disease and previous bypass surgery with an extensive scar from inferior wall myocardial infarction, ischemic cardiomyopathy with combined systolic and diastolic heart failure, history of sustained symptomatic ventricular tachycardia (probably "scar VT"), previous ischemic stroke, sinus node dysfunction with dual chamber permanent pacemaker (generator change 2018 [employer]), severe orthostatic hypotension that is felt to be neurogenic in etiology.   He continues to have fairly good control of his symptoms of orthostatic hypotension on combination of midodrine and droxidopa.  Having said that he is very sedentary and only stands briefly to pivot from his wheelchair to the commode and back.  He does not have any cardiovascular complaints with this sedentary lifestyle.  He denies falls, bleeding complications.  Intermittent mild ankle edema, usually worse on the right.  The patient specifically denies any chest pain at rest exertion, dyspnea at rest or with exertion, orthopnea, paroxysmal nocturnal dyspnea, syncope, palpitations, focal neurological deficits, intermittent claudication, unexplained weight gain, cough, hemoptysis or wheezing.  He is on chronic dual antiplatelet therapy following a November 2017 ischemic stroke, but does not take anticoagulants.  He has not had any falls, injuries or bleeding problems.  He takes a very low-dose of amiodarone for his history of ventricular tachycardia, not for atrial fibrillation.  Pacemaker function is normal estimated longevity 9.4 years.  He is  "atrially dependent", but he has normal AV conduction most of the time. Lead parameters remain normal.  He has 97 % atrial pacing and 34 % ventricular pacing.  He has never had atrial fibrillation and has not had any recent episodes of ventricular tachycardia.  There are no detectable P waves.  All other lead parameters are in desirable range.  He has CAD and is currently free of angina pectoris. He underwent bypass surgery 1996. He had placement of a stent to the SVG to RCA in 2011. His last cardiac catheterization in 2012 show that vessel to be widely patent but all his native coronary arteries are occluded and he is graft dependent. He is nuclear stress test in December 2015 showed an extensive inferior wall scar without any areas of ischemia. in June 2016 he presented with rapid palpitations associated with near-syncope, dyspnea and extreme fatigue , but did not lose consciousness. Interrogation of his pacemaker showed relatively slow ventricular tachycardia at around 150 bpm that appear to have a regular, monomorphic electrogram. His dose of amiodarone was increased and he has not had ventricular tachycardia since. He was evaluated by Dr. Caryl Comes. After some debate, we decided that a conservative approach is most appropriate in view of his declining overall functional status and advanced age. Unfortunately has also been developing progressive dementia, primarily manifested as short-term memory loss. He is now a resident at [facility]. In November 2017 he presented with a right lower extremity weakness and was diagnosed as having an ischemic stroke. He was prescribed an oral anticoagulant due to suspicion that his stroke was related to atrial fibrillation, but atrial fibrillation has never occurred/has never been recorded by his pacemaker so this medication  was stopped.  Echo July 2019 shows stable LVEF 35-40%, moderate aortic stenosis with a mean gradient of 13 mmHg and an estimated aortic valve area of  1.2 cm    Past Medical History:  Diagnosis Date  . Arthritis    "minor, back and sometimes knees" (12/15/2012)  . Bradycardia    AFib/SSS s/p St Jude PPM 04/12/2008  . CAD (coronary artery disease) 12/30/2014   CABG (LIMA-LAD, SVG-RCA, SVG-OM in 1996).  07/2009 BMS to SVG-RCA. Cath in 04/2010 with patent stents   . Cardiomyopathy, ischemic 08/25/2012  . CHF (congestive heart failure) ([place])   . Chronic knee pain 12/03/2014  . Combined congestive systolic and diastolic heart failure ([place]) 02/02/2015   Hx EF 41%. BNP 96.8 02/21/15 Torsemide 04/06/15 Na 142, K 4.6, Bun 16, creat 0.89 04/20/14 BNP 111.7, Na 142, K 4.6, Bun 16, creat 0.9   . Depression with anxiety 02/02/2015   02/21/15 Hgb A1c 5.8 03/10/15 MMSE 30/30   . Dizziness, after diuretic asscoiated with hypotension and responded to fluid bolus 06/05/2011   04/28/15 US carotid R+L normal bilateral arterial velocities.    Marland Kitchen Dyspnea 08/11/2014   Followed in Pulmonary clinic/ [facility]/ [place]  - 08/11/2014  Walked RA x 1 laps @ 185 ft each stopped due to fatigue/off balance/ slow pace/  no sob or desat  - PFT's  09/26/2014  FEV1 2.26 (85 % ) ratio 76  p no % improvement from saba with DLCO  67 % corrects to 93 % for alv volume      Since prev study 08/04/13 minimal change lung vol or dlco    . Embolic cerebral infarction ([place]) 12/06/2015  . Exertional shortness of breath    "sometimes walking" (12/15/2012)  . GERD (gastroesophageal reflux disease)   . Gout 02/09/2015  . Heart murmur    "just told I had one today" (12/15/2012)  . Hiatal hernia   . Hyperlipidemia   . Hypertension   . Hypothyroid   . Influenza A 03/10/2016  . Insomnia   . Melanoma of back ([place]) 1976  . Myocardial infarction [facility]) 1996; 2011   "both silent" (12/15/2012)  . Nonrheumatic aortic valve stenosis   . Orthostatic hypotension   . Osteoporosis, senile   . Pacemaker   . RBBB   . Restless leg 02/02/2015  . Right leg weakness 12/06/2015  . [facility] spotted fever    . S/P CABG x 4   . Sick sinus syndrome ([place]) 01/31/2014  . Sustained ventricular tachycardia ([place]) 07/27/2014  . Urinary retention 12/30/2014   has a cathrater    Past Surgical History:  Procedure Laterality Date  . CARDIAC CATHETERIZATION  04/2010   LIMA to LAD patent,SVG to OM patent,no in-stnet restenosis RCA  . CATARACT EXTRACTION W/ INTRAOCULAR LENS  IMPLANT, BILATERAL Bilateral 2012  . CORONARY ANGIOPLASTY WITH STENT PLACEMENT  07/2009   bare metal stent to SVG to the RCA  . CORONARY ARTERY BYPASS GRAFT  1996   LIMA to LAD,SVG to RCA & SVG to OM  . INSERT / REPLACE / REMOVE PACEMAKER  2010  . MELANOMA EXCISION  05/1974 X2   "taken off my back" (12/15/2012)  . NM MYOVIEW LTD  06/2011   low risk  . PPM GENERATOR CHANGEOUT N/A 01/22/2017   Procedure: PPM GENERATOR CHANGEOUT;  Surgeon: [name], MD;  Location: [place] [facility];  Service: Cardiovascular;  Laterality: N/A;  . TONSILLECTOMY  1938  . TRANSURETHRAL RESECTION OF PROSTATE  1986  .  US ECHOCARDIOGRAPHY  07/11/2009   EF 45-50%    Current Medications: Outpatient Medications Prior to Visit  Medication Sig Dispense Refill  . acetaminophen (TYLENOL) 500 MG tablet Take 500 mg by mouth 3 (three) times daily.     . Amino Acids-Protein Hydrolys (FEEDING SUPPLEMENT, PRO-STAT SUGAR FREE 64,) LIQD Take 30 mLs by mouth 2 (two) times a day.     Marland Kitchen amiodarone (PACERONE) 100 MG tablet Take 1 tablet (100 mg total) by mouth daily. 90 tablet 3  . aspirin EC 81 MG tablet Take 1 tablet (81 mg total) by mouth daily. 30 tablet 1  . Cholecalciferol (VITAMIN D3) 5000 UNITS CAPS Take 5,000 Units by mouth every Monday.     . clopidogrel (PLAVIX) 75 MG tablet TAKE 1 TABLET ONCE DAILY. 90 tablet 1  . clotrimazole-betamethasone (LOTRISONE) cream Apply 1 application topically daily as needed.    . DULoxetine (CYMBALTA) 20 MG capsule Take 20 mg by mouth daily. For anxiety,insomnia and back pain    . fluticasone (CUTIVATE) 0.05 % cream Apply  topically 2 (two) times daily as needed. Apply small amount on  face and back also can apply as needed    . fluticasone (FLONASE) 50 MCG/ACT nasal spray Place 1 spray into both nostrils 2 (two) times daily.    Marland Kitchen guaiFENesin (ROBITUSSIN) 100 MG/5ML SOLN Take 5 mLs (100 mg total) by mouth every 4 (four) hours as needed for cough or to loosen phlegm. 1200 mL 0  . levothyroxine (SYNTHROID, LEVOTHROID) 100 MCG tablet Take 100 mcg by mouth daily before breakfast.    . meclizine (ANTIVERT) 25 MG tablet Take 1 tablet (25 mg total) by mouth 2 (two) times daily as needed for dizziness. 30 tablet 0  . Melatonin 3 MG TABS Take 3 mg by mouth at bedtime.    . methocarbamol (ROBAXIN) 500 MG tablet Take 250 mg by mouth 2 (two) times daily.     . midodrine (PROAMATINE) 5 MG tablet Take 5 mg by mouth 3 (three) times daily with meals. Notify provider if BP is < 90/60.    Marland Kitchen mirtazapine (REMERON) 7.5 MG tablet Take 7.5 mg by mouth at bedtime.    . Multiple Vitamins-Minerals (DECUBI-VITE) CAPS Take 1 capsule by mouth daily.    . Nutritional Supplements (FEEDING SUPPLEMENT, BOOST BREEZE,) LIQD Take by mouth 2 (two) times daily.    . pantoprazole (PROTONIX) 40 MG tablet TAKE 1 TABLET BY MOUTH TWICE DAILY. 60 tablet 11  . Plecanatide (TRULANCE) 3 MG TABS Take 3 mg by mouth daily. 30 tablet 11  . polyethylene glycol (MIRALAX / GLYCOLAX) 17 g packet Take 17 g by mouth daily as needed. Take 1-2 capfuls in 8 oz of H2O    . pravastatin (PRAVACHOL) 40 MG tablet Take 1 tablet (40 mg total) by mouth at bedtime. 90 tablet 3  . rOPINIRole (REQUIP) 0.25 MG tablet Take 0.5 mg by mouth 2 (two) times daily.    Marland Kitchen rOPINIRole (REQUIP) 0.5 MG tablet Take 0.5 mg by mouth daily as needed.    . sennosides-docusate sodium (SENOKOT-S) 8.6-50 MG tablet Take 2 tablets by mouth 2 (two) times daily.     Marland Kitchen torsemide (DEMADEX) 10 MG tablet Take 1 tablet (10 mg total) by mouth daily.    . Droxidopa 100 MG CAPS Take 100 mg by mouth 3 (three) times  daily. 90 capsule 6   No facility-administered medications prior to visit.      Allergies:   Altace [ramipril], Crestor [rosuvastatin calcium],  Penicillins, Sulfa antibiotics, and Sulfamethoxazole-trimethoprim   Social History   Socioeconomic History  . Marital status: Married    Spouse name: Not on file  . Number of children: 2  . Years of education: Masters  . Highest education level: Not on file  Occupational History  . Occupation: Retired Company secretary -Pensions consultant  Social Needs  . Financial resource strain: Not hard at all  . Food insecurity    Worry: Never true    Inability: Never true  . Transportation needs    Medical: No    Non-medical: No  Tobacco Use  . Smoking status: Never Smoker  . Smokeless tobacco: Never Used  Substance and Sexual Activity  . Alcohol use: No    Alcohol/week: 0.0 standard drinks  . Drug use: No  . Sexual activity: Never  Lifestyle  . Physical activity    Days per week: 7 days    Minutes per session: 10 min  . Stress: Not at all  Relationships  . Social connections    Talks on phone: More than three times a week    Gets together: More than three times a week    Attends religious service: Never    Active member of club or organization: No    Attends meetings of clubs or organizations: Never    Relationship status: Married  Other Topics Concern  . Not on file  Social History Narrative   Lives at [place] to [place] 01/09/15   Married - Violet   Never smoked   Alcohol none   Previously employed as Scientist, forensic for [employer].         Diet:Low sodium   Do you drink/eat things with caffeine? No   Marital status: Married                              What year were you married?1950   Do you live in a house, apartment, assisted living, condo, trailer, etc)?    Is it one or more stories? 1   How many persons live in your home? 2   Do you have any pets in your home? No   Current or past profession: Minister,  [employer]   Do you exercise?     Very Little                                                 Type & how often:    Do you have a living will?  Yes   Do you have a DNR Form? Yes   Do you have a POA/HPOA forms? Yes     Family History:  The patient's family history includes Anuerysm in his son; Coronary artery disease in his father and mother; Diabetes in his father and mother; Heart disease in his brother, father, and mother; Lung cancer in his father.   ROS:   Please see the history of present illness.    ROS  All other systems are reviewed and are negative.   PHYSICAL EXAM:   VS:  BP 117/72   Pulse 70   Ht 5\' 10"  (1.778 m)   Wt 177 lb (80.3 kg)   SpO2 97%   BMI 25.40 kg/m      General: Alert, oriented x3, no distress, he appears frail  but in good spirits.  Healthy pacemaker site, left subclavian Head: no evidence of trauma, PERRL, EOMI, no exophtalmos or lid lag, no myxedema, no xanthelasma; normal ears, nose and oropharynx Neck: normal jugular venous pulsations and no hepatojugular reflux; brisk carotid pulses without delay and no carotid bruits Chest: clear to auscultation, no signs of consolidation by percussion or palpation, normal fremitus, symmetrical and full respiratory excursions Cardiovascular: normal position and quality of the apical impulse, regular rhythm, normal first and widely split second heart sounds, no murmurs, rubs or gallops Abdomen: no tenderness or distention, no masses by palpation, no abnormal pulsatility or arterial bruits, normal bowel sounds, no hepatosplenomegaly Extremities: no clubbing, cyanosis or edema; 2+ radial, ulnar and brachial pulses bilaterally; 2+ right femoral, posterior tibial and dorsalis pedis pulses; 2+ left femoral, posterior tibial and dorsalis pedis pulses; no subclavian or femoral bruits Neurological: grossly nonfocal Psych: Normal mood and affect   Wt Readings from Last 3 Encounters:  11/30/18 177 lb (80.3 kg)   11/17/18 178 lb 11.2 oz (81.1 kg)  10/22/18 179 lb 8 oz (81.4 kg)      Studies/Labs Reviewed:   EKG:  EKG is ordered today.  It shows atrial paced, ventricular sensed rhythm with long AV delay, pre-existing bifascicular block, right bundle branch block and left anterior fascicular block.  Very broad QRS 188 ms, QTC 544 ms  Recent Labs: 01/18/2018: B Natriuretic Peptide 151.6 08/27/2018: TSH 2.03 09/23/2018: ALT 14; BUN 18; Creatinine 0.7; Hemoglobin 12.7; Platelets 234; Potassium 4.1; Sodium 141   Lipid Panel    Component Value Date/Time   CHOL 93 03/30/2018   TRIG 127 03/30/2018   HDL 35 03/30/2018   CHOLHDL 2.6 05/05/2017 0000   VLDL 20 12/07/2015 0449   LDLCALC 37 03/30/2018   LDLCALC 43 05/05/2017 0000     ASSESSMENT:    1. VT (ventricular tachycardia) ([place])   2. SSS (sick sinus syndrome) ([place])   3. Encounter for monitoring amiodarone therapy   4. Pacemaker   5. Chronic combined systolic and diastolic heart failure ([place])   6. Orthostatic hypotension   7. Coronary artery disease of native artery of native heart with stable angina pectoris ([place])   8. History of arterial ischemic stroke   9. Hypercholesterolemia   10. Aortic valve stenosis, nonrheumatic      PLAN:  In order of problems listed above:  1. VT: It is been about 4 years since we have seen any episodes of ventricular tachycardia and has not recurred after we reduced the dose of amiodaron. 2. Amiodarone: Well-tolerated.  Reminded him he needs liver function tests and thyroid tests every 6 months and he should report any unexplained respiratory symptoms promptly.  Yearly eye exam. 3. SSS: He has no intrinsic atrial activity. "Atrially dependent".  He has significant infrahisian conduction abnormalities, but his overall burden of ventricular pacing remains low at under 40%.. 4. PM: Office visit usually every 6 months, remote downloads in between.  During the coronavirus pandemic it is reasonable to see him  just once a year. 5. CHF: He appears to be euvolemic or close to that goal.  He should not receive fludrocortisone since this caused heart failure exacerbation in the past. 6. Orthostatic hypotension: He went from having severe hypertension requiring multiple agents for control to having extremely severe orthostatic hypotension that requires to vasoconstrictor medications for symptom relief. 7. CAD: Angina free; has an extensive scar (probably the source of his previous VT), but no reversible ischemia on his most  recent nuclear study from November 2014. He has occluded native arteries and is graft dependent (LIMA to LAD, SVG to RCA, SVG to OM; status post stent in SVG to RCA 2011, patent by cath March 2012). November 2014 nuclear study shows inferior scar without ischemia, EF 41%.Conservative management is recommended. 8. History of CVA: On dual antiplatelet therapy. His pacemaker has never shown evidence of atrial arrhythmia (flutter or fibrillation) that could explain his stroke.  He does not need anticoagulants 9. HLP: Excellent LDL, well within target range 10. AS: Moderate by echo in July 2019.  We could easily miss symptoms of severe aortic stenosis since he is so sedentary, but I am not sure this would make a big difference in overall management.  He is definitely not a candidate for surgical aortic valve replacement and is at most a marginal candidate for TAVR.  Recheck echo next year.    Medication Adjustments/Labs and Tests Ordered: Current medicines are reviewed at length with the patient today.  Concerns regarding medicines are outlined above.  Medication changes, Labs and Tests ordered today are listed in the Patient Instructions below. Patient Instructions  Medication Instructions:  No changes *If you need a refill on your cardiac medications before your next appointment, please call your pharmacy*  Lab Work: None ordered If you have labs (blood work) drawn today and your tests are  completely normal, you will receive your results only by: Marland Kitchen MyChart Message (if you have MyChart) OR . A paper copy in the mail If you have any lab test that is abnormal or we need to change your treatment, we will call you to review the results.  Testing/Procedures: None ordered  Follow-Up: At [facility], you and your health needs are our priority.  As part of our continuing mission to provide you with exceptional heart care, we have created designated Provider Care Teams.  These Care Teams include your primary Cardiologist (physician) and Advanced Practice Providers (APPs -  Physician Assistants and Nurse Practitioners) who all work together to provide you with the care you need, when you need it.  Your next appointment:   12 months  The format for your next appointment:   In Person  Provider:   [name], MD     Signed, [name], MD  12/03/2018 2:48 PM    [address], [place], [place]  [address] Phone: [phone]; Fax: [phone]

## 2018-11-30 NOTE — Patient Instructions (Signed)
Medication Instructions:  No changes *If you need a refill on your cardiac medications before your next appointment, please call your pharmacy*  Lab Work: None ordered If you have labs (blood work) drawn today and your tests are completely normal, you will receive your results only by: . MyChart Message (if you have MyChart) OR . A paper copy in the mail If you have any lab test that is abnormal or we need to change your treatment, we will call you to review the results.  Testing/Procedures: None ordered  Follow-Up: At [facility], you and your health needs are our priority.  As part of our continuing mission to provide you with exceptional heart care, we have created designated Provider Care Teams.  These Care Teams include your primary Cardiologist (physician) and Advanced Practice Providers (APPs -  Physician Assistants and Nurse Practitioners) who all work together to provide you with the care you need, when you need it.  Your next appointment:   12 month(s)  The format for your next appointment:   In Person  Provider:   Mihai Croitoru, MD   

## 2018-12-01 ENCOUNTER — Telehealth: Payer: Self-pay | Admitting: Cardiovascular Disease

## 2018-12-01 ENCOUNTER — Other Ambulatory Visit: Payer: Self-pay | Admitting: Cardiovascular Disease

## 2018-12-01 NOTE — Telephone Encounter (Signed)
Returned call to [place] at [facility] calling to clarify a order.She wanted to know if patient needs a tsh and lft's done every 6 months at their facility or is this just a one time order to be done in February.Message sent to Dr.Croitoru for advice.

## 2018-12-01 NOTE — Telephone Encounter (Signed)
New Message:      Please call Estill Bamberg at [facility]. She wants to talk about doing the pt's lab work at [facility].

## 2018-12-01 NOTE — Telephone Encounter (Signed)
Needs to be every 6 months (while taking amiodarone, which is likely to be lifelong), but can coordinate with his other labs drawn per Dr. Lyndel Safe.

## 2018-12-01 NOTE — Telephone Encounter (Signed)
Returned call to [employer] with [facility] left Dr.Croitoru's recommendation on personal voice mail.

## 2018-12-02 ENCOUNTER — Other Ambulatory Visit: Payer: Self-pay | Admitting: Cardiovascular Disease

## 2018-12-02 MED ORDER — NORTHERA 100 MG PO CAPS: ORAL_CAPSULE | ORAL | 11 refills | Status: DC

## 2018-12-02 NOTE — Telephone Encounter (Signed)
New message   Patient's wife states that   [place] 100 MG CAPS   Will need approval from Dr. Sallyanne Kuster and needs a new prescription. Please advise.

## 2018-12-02 NOTE — Telephone Encounter (Signed)
Will refill.

## 2018-12-03 ENCOUNTER — Encounter: Payer: Self-pay | Admitting: Cardiovascular Disease

## 2018-12-07 ENCOUNTER — Telehealth: Payer: Self-pay | Admitting: Cardiovascular Disease

## 2018-12-07 ENCOUNTER — Non-Acute Institutional Stay (SKILLED_NURSING_FACILITY): Payer: Medicare Other | Admitting: Internal Medicine

## 2018-12-07 ENCOUNTER — Encounter: Payer: Self-pay | Admitting: Internal Medicine

## 2018-12-07 DIAGNOSIS — I472 Ventricular tachycardia, unspecified: Secondary | ICD-10-CM

## 2018-12-07 DIAGNOSIS — I5040 Unspecified combined systolic (congestive) and diastolic (congestive) heart failure: Secondary | ICD-10-CM

## 2018-12-07 DIAGNOSIS — E039 Hypothyroidism, unspecified: Secondary | ICD-10-CM

## 2018-12-07 DIAGNOSIS — G903 Multi-system degeneration of the autonomic nervous system: Secondary | ICD-10-CM

## 2018-12-07 DIAGNOSIS — E032 Hypothyroidism due to medicaments and other exogenous substances: Secondary | ICD-10-CM | POA: Diagnosis not present

## 2018-12-07 DIAGNOSIS — R339 Retention of urine, unspecified: Secondary | ICD-10-CM

## 2018-12-07 DIAGNOSIS — F418 Other specified anxiety disorders: Secondary | ICD-10-CM

## 2018-12-07 DIAGNOSIS — G2581 Restless legs syndrome: Secondary | ICD-10-CM

## 2018-12-07 NOTE — Progress Notes (Signed)
Location:      Place of Service:     Provider:   Code Status:  Goals of Care:  Advanced Directives 11/17/2018  Does Patient Have a Medical Advance Directive? Yes  Type of Paramedic of [place];Living will  Does patient want to make changes to medical advance directive? No - Patient declined  Copy of [place] in Chart? Yes - validated most recent copy scanned in chart (See row information)  Would patient like information on creating a medical advance directive? -  Pre-existing out of facility DNR order (yellow form or pink MOST form) -     Chief Complaint  Patient presents with  . Medical Management of Chronic Issues    HPI: Patient is a 85 y.o. male seen today for medical management of chronic diseases.   He is Long Term Resident of facility  He has h/o CAD s/p Bypass surgery with Extensive Inferior Wall Scarring, Ischemic Cardiomyopathy withsystolic heart failure,Moderate AS, history of ventricular tachycardia stable on amiodarone, sinus node dysfunction with dual-chamber PPP, severe orthostatic hypotension neurogenic,history of compression fractures, history of ischemic stroke in the past on dual therapy.,History of chronic indwelling Foley catheter.  Patient was c/o Left Toe Pain today. With Mild swelling. No Redness. Other wise he is doing really well in facility. Was seen recently by Dr Marjo Bicker Past Medical History:  Diagnosis Date  . Arthritis    "minor, back and sometimes knees" (12/15/2012)  . Bradycardia    AFib/SSS s/p St Jude PPM 04/12/2008  . CAD (coronary artery disease) 12/30/2014   CABG (LIMA-LAD, SVG-RCA, SVG-OM in 1996).  07/2009 BMS to SVG-RCA. Cath in 04/2010 with patent stents   . Cardiomyopathy, ischemic 08/25/2012  . CHF (congestive heart failure) ([place])   . Chronic knee pain 12/03/2014  . Combined congestive systolic and diastolic heart failure ([place]) 02/02/2015   Hx EF 41%. BNP 96.8 02/21/15 Torsemide  04/06/15 Na 142, K 4.6, Bun 16, creat 0.89 04/20/14 BNP 111.7, Na 142, K 4.6, Bun 16, creat 0.9   . Depression with anxiety 02/02/2015   02/21/15 Hgb A1c 5.8 03/10/15 MMSE 30/30   . Dizziness, after diuretic asscoiated with hypotension and responded to fluid bolus 06/05/2011   04/28/15 US carotid R+L normal bilateral arterial velocities.    Marland Kitchen Dyspnea 08/11/2014   Followed in Pulmonary clinic/ [facility]/ [place]  - 08/11/2014  Walked RA x 1 laps @ 185 ft each stopped due to fatigue/off balance/ slow pace/  no sob or desat  - PFT's  09/26/2014  FEV1 2.26 (85 % ) ratio 76  p no % improvement from saba with DLCO  67 % corrects to 93 % for alv volume      Since prev study 08/04/13 minimal change lung vol or dlco    . Embolic cerebral infarction ([place]) 12/06/2015  . Exertional shortness of breath    "sometimes walking" (12/15/2012)  . GERD (gastroesophageal reflux disease)   . Gout 02/09/2015  . Heart murmur    "just told I had one today" (12/15/2012)  . Hiatal hernia   . Hyperlipidemia   . Hypertension   . Hypothyroid   . Influenza A 03/10/2016  . Insomnia   . Melanoma of back ([place]) 1976  . Myocardial infarction [facility]) 1996; 2011   "both silent" (12/15/2012)  . Nonrheumatic aortic valve stenosis   . Orthostatic hypotension   . Osteoporosis, senile   . Pacemaker   . RBBB   . Restless leg 02/02/2015  .  Right leg weakness 12/06/2015  . [facility] spotted fever   . S/P CABG x 4   . Sick sinus syndrome ([place]) 01/31/2014  . Sustained ventricular tachycardia ([place]) 07/27/2014  . Urinary retention 12/30/2014   has a cathrater    Past Surgical History:  Procedure Laterality Date  . CARDIAC CATHETERIZATION  04/2010   LIMA to LAD patent,SVG to OM patent,no in-stnet restenosis RCA  . CATARACT EXTRACTION W/ INTRAOCULAR LENS  IMPLANT, BILATERAL Bilateral 2012  . CORONARY ANGIOPLASTY WITH STENT PLACEMENT  07/2009   bare metal stent to SVG to the RCA  . CORONARY ARTERY BYPASS GRAFT  1996   LIMA to LAD,SVG to  RCA & SVG to OM  . INSERT / REPLACE / REMOVE PACEMAKER  2010  . MELANOMA EXCISION  05/1974 X2   "taken off my back" (12/15/2012)  . NM MYOVIEW LTD  06/2011   low risk  . PPM GENERATOR CHANGEOUT N/A 01/22/2017   Procedure: PPM GENERATOR CHANGEOUT;  Surgeon: [name], MD;  Location: [place] [facility];  Service: Cardiovascular;  Laterality: N/A;  . TONSILLECTOMY  1938  . TRANSURETHRAL RESECTION OF PROSTATE  1986  . US ECHOCARDIOGRAPHY  07/11/2009   EF 45-50%    Allergies  Allergen Reactions  . Altace [Ramipril] Cough  . Crestor [Rosuvastatin Calcium] Rash  . Penicillins Rash and Other (See Comments)    Has patient had a PCN reaction causing immediate rash, facial/tongue/throat swelling, SOB or lightheadedness with hypotension: Yes Has patient had a PCN reaction causing severe rash involving mucus membranes or skin necrosis: No Has patient had a PCN reaction that required hospitalization No Has patient had a PCN reaction occurring within the last 10 years: No If all of the above answers are "NO", then may proceed with Cephalosporin use.   . Sulfa Antibiotics Rash  . Sulfamethoxazole-Trimethoprim Rash    Outpatient Encounter Medications as of 12/07/2018  Medication Sig  . acetaminophen (TYLENOL) 500 MG tablet Take 500 mg by mouth 3 (three) times daily.   . Amino Acids-Protein Hydrolys (FEEDING SUPPLEMENT, PRO-STAT SUGAR FREE 64,) LIQD Take 30 mLs by mouth 2 (two) times a day.   Marland Kitchen amiodarone (PACERONE) 100 MG tablet Take 1 tablet (100 mg total) by mouth daily.  Marland Kitchen aspirin EC 81 MG tablet Take 1 tablet (81 mg total) by mouth daily.  . Cholecalciferol (VITAMIN D3) 5000 UNITS CAPS Take 5,000 Units by mouth every Monday.   . clopidogrel (PLAVIX) 75 MG tablet TAKE 1 TABLET ONCE DAILY.  . clotrimazole-betamethasone (LOTRISONE) cream Apply 1 application topically daily as needed.  . Droxidopa ([name]) 100 MG CAPS TAKE 1 CAPSULE BY MOUTH THREE TIMES DAILY. TAKE LAST DOSE AT LEAST 3 HOURS  BEFORE BEDTIME.  . DULoxetine (CYMBALTA) 20 MG capsule Take 20 mg by mouth daily. For anxiety,insomnia and back pain  . fluticasone (CUTIVATE) 0.05 % cream Apply topically 2 (two) times daily as needed. Apply small amount on  face and back also can apply as needed  . fluticasone (FLONASE) 50 MCG/ACT nasal spray Place 1 spray into both nostrils 2 (two) times daily.  Marland Kitchen guaiFENesin (ROBITUSSIN) 100 MG/5ML SOLN Take 5 mLs (100 mg total) by mouth every 4 (four) hours as needed for cough or to loosen phlegm.  Marland Kitchen levothyroxine (SYNTHROID, LEVOTHROID) 100 MCG tablet Take 100 mcg by mouth daily before breakfast.  . meclizine (ANTIVERT) 25 MG tablet Take 1 tablet (25 mg total) by mouth 2 (two) times daily as needed for dizziness.  . Melatonin  3 MG TABS Take 3 mg by mouth at bedtime.  . methocarbamol (ROBAXIN) 500 MG tablet Take 250 mg by mouth 2 (two) times daily.   . midodrine (PROAMATINE) 5 MG tablet Take 5 mg by mouth 3 (three) times daily with meals. Notify provider if BP is < 90/60.  Marland Kitchen mirtazapine (REMERON) 7.5 MG tablet Take 7.5 mg by mouth at bedtime.  . Multiple Vitamins-Minerals (DECUBI-VITE) CAPS Take 1 capsule by mouth daily.  . Nutritional Supplements (FEEDING SUPPLEMENT, BOOST BREEZE,) LIQD Take by mouth 2 (two) times daily.  . pantoprazole (PROTONIX) 40 MG tablet TAKE 1 TABLET BY MOUTH TWICE DAILY.  Marland Kitchen Plecanatide (TRULANCE) 3 MG TABS Take 3 mg by mouth daily.  . polyethylene glycol (MIRALAX / GLYCOLAX) 17 g packet Take 17 g by mouth daily as needed. Take 1-2 capfuls in 8 oz of H2O  . pravastatin (PRAVACHOL) 40 MG tablet Take 1 tablet (40 mg total) by mouth at bedtime.  Marland Kitchen rOPINIRole (REQUIP) 0.25 MG tablet Take 0.5 mg by mouth 2 (two) times daily.  Marland Kitchen rOPINIRole (REQUIP) 0.5 MG tablet Take 0.5 mg by mouth daily as needed.  . sennosides-docusate sodium (SENOKOT-S) 8.6-50 MG tablet Take 2 tablets by mouth 2 (two) times daily.   Marland Kitchen torsemide (DEMADEX) 10 MG tablet Take 1 tablet (10 mg total) by  mouth daily.   No facility-administered encounter medications on file as of 12/07/2018.     Review of Systems:  Review of Systems  Review of Systems  Constitutional: Negative for activity change, appetite change, chills, diaphoresis, fatigue and fever.  HENT: Negative for mouth sores, postnasal drip, rhinorrhea, sinus pain and sore throat.   Respiratory: Negative for apnea, cough, chest tightness, shortness of breath and wheezing.   Cardiovascular: Negative for chest pain, palpitations and leg swelling.  Gastrointestinal: Negative for abdominal distention, abdominal pain, constipation, diarrhea, nausea and vomiting.  Genitourinary: Negative for dysuria and frequency.  Musculoskeletal: Negative for arthralgias, joint swelling and myalgias.  Skin: Negative for rash.  Neurological: Negative for dizziness, syncope, weakness, light-headedness and numbness.  Psychiatric/Behavioral: Negative for behavioral problems, confusion and sleep disturbance.     Health Maintenance  Topic Date Due  . PNA vac Low Risk Adult (2 of 2 - PCV13) 10/06/2010  . INFLUENZA VACCINE  09/05/2018  . TETANUS/TDAP  08/01/2026 (Originally 08/01/2024)  . DEXA SCAN  12/14/2024    Physical Exam: Vitals:   12/07/18 1952  BP: 128/75  Pulse: 70  Resp: 20  Temp: (!) 97.3 F (36.3 C)  Weight: 182 lb (82.6 kg)   Body mass index is 26.11 kg/m. Physical Exam  Constitutional: Oriented to person, place, and time. Well-developed and well-nourished.  HENT:  Head: Normocephalic.  Mouth/Throat: Oropharynx is clear and moist.  Eyes: Pupils are equal, round, and reactive to light.  Neck: Neck supple.  Cardiovascular: Normal rate and normal heart sounds.  Murmur Present Pulmonary/Chest: Effort normal and breath sounds normal. No respiratory distress. No wheezes. She has no rales.  Abdominal: Soft. Bowel sounds are normal. No distension. There is no tenderness. There is no rebound.  Musculoskeletal: Has mild swollen Left  Big toe with Tenderness in Proximal Joint Lymphadenopathy: none Neurological: Alert and oriented to person, place, and time. Wheelchair Dependent Independent in transfers Skin: Skin is warm and dry.  Psychiatric: Normal mood and affect. Behavior is normal. Thought content normal.    Labs reviewed: Basic Metabolic Panel: Recent Labs    01/15/18 1207 01/18/18 1657 01/19/18 03/30/18 05/09/18 08/27/18 09/23/18  NA 139  139 139  --  138  --  141  K 4.1 3.8 4.6  --  3.6  --  4.1  CL 101 104  --   --   --   --   --   CO2 24 26  --   --   --   --   --   GLUCOSE 100* 127*  --   --   --   --   --   BUN 15 17 16   --  18  --  18  CREATININE 0.75* 0.73 0.7  --  0.8  --  0.7  CALCIUM 8.9 8.3*  --   --   --   --   --   TSH  --   --   --  3.38  --  2.03  --    Liver Function Tests: Recent Labs    12/30/17 01/18/18 1657 05/09/18 09/23/18  AST 21 20 18 23   ALT 12 13 15 14   ALKPHOS 92 91  --  64  BILITOT  --  0.5  --   --   PROT  --  6.7  --   --   ALBUMIN  --  3.2*  --   --    Recent Labs    01/18/18 1657  LIPASE 23   No results for input(s): AMMONIA in the last 8760 hours. CBC: Recent Labs    01/18/18 1657 05/09/18 09/23/18  WBC 7.9 10.8 6.2  NEUTROABS 5.4  --   --   HGB 11.6* 11.5* 12.7*  HCT 36.9* 33* 38*  MCV 98.4  --   --   PLT 260 202 234   Lipid Panel: Recent Labs    03/30/18  CHOL 93  HDL 35  LDLCALC 37  TRIG 127   Lab Results  Component Value Date   HGBA1C 5.6 12/07/2015    Procedures since last visit: No results found.  Assessment/Plan  Left Toe Tender ? Pseudogout Patient has this history. His Uric Acid 2.7 in the Past Will try Mobic 7.5 mg QD for 7 Days  Neurogenic orthostatic hypotension  Doing well on Midodrine and Droxidopa  CAD Doing well on aspirin and Statin Last LDL was 37 S/P PPP On Low dose of Amiodarone for H/o VT Follows with Cardiology Per Cardiology Needs TSH and LFT Q 6 Months  Chronic combined systolic and diastolic  congestive heart failure  On Low dose of Demadex Hypothyroidism,  On Synthyroid TSH Normal in 07/20  Hyperlipidemia On statin Will repeat Lipid Panel next Blood Work GERD Change Protonix to QD Neurogenic bladder Chronic Foley H/o CVA On Dual therapy RLS (restless legs syndrome) On Requip Will Make Robaxin PRN Depression  Now has gained weight Is doing well Will discontinue Remeron Continue on Cymbalta Labs/tests ordered:  * No order type specified * Next appt:  Visit date not found  Total time spent in this patient care encounter was  45_  minutes; greater than 50% of the visit spent counseling patient and staff, reviewing records , Labs and coordinating care for problems addressed at this encounter.

## 2018-12-07 NOTE — Telephone Encounter (Signed)
No message needed °

## 2018-12-17 ENCOUNTER — Non-Acute Institutional Stay (SKILLED_NURSING_FACILITY): Payer: Medicare Other | Admitting: Nurse Practitioner

## 2018-12-17 ENCOUNTER — Encounter: Payer: Self-pay | Admitting: Nurse Practitioner

## 2018-12-17 DIAGNOSIS — I472 Ventricular tachycardia, unspecified: Secondary | ICD-10-CM

## 2018-12-17 DIAGNOSIS — I2510 Atherosclerotic heart disease of native coronary artery without angina pectoris: Secondary | ICD-10-CM | POA: Diagnosis not present

## 2018-12-17 DIAGNOSIS — I5040 Unspecified combined systolic (congestive) and diastolic (congestive) heart failure: Secondary | ICD-10-CM

## 2018-12-17 DIAGNOSIS — Z8673 Personal history of transient ischemic attack (TIA), and cerebral infarction without residual deficits: Secondary | ICD-10-CM

## 2018-12-17 NOTE — Assessment & Plan Note (Signed)
Hx of CVA/TIA, currently on DAPT ASA 81mg  qd, Plavix 75mg  qd. Benefit of ASA and Plavix dual therapy must be balanced against hemorrhage risk. Up To Date: DAPT as benefit appears to be most pronounced in the short term period post stroke/TIA. Will recommend dc Plavix if HPOA consents.

## 2018-12-17 NOTE — Assessment & Plan Note (Signed)
Heart rate is in control, continue Amiodarone 100mg qd.  

## 2018-12-17 NOTE — Assessment & Plan Note (Signed)
Chronic edema BLE, continue Torsemide.

## 2018-12-17 NOTE — Assessment & Plan Note (Addendum)
Stable, s/p CABG 2011, s/p stent per history,  continue ASA, Statin, dc Plavix if HPOA consents.

## 2018-12-17 NOTE — Progress Notes (Signed)
Location:   S[facility] Room Number: 48 Place of Service:  SNF (31) Provider:  Briah Nary NP  [name], MD  Patient Care Team: [name], MD as PCP - General (Internal Medicine) [name], MD as PCP - Cardiology (Cardiology) [name], MD as Attending Physician (Cardiology) Vevelyn Royals, MD as Consulting Physician (Ophthalmology) Franchot Gallo, MD as Consulting Physician (Urology) Tanda Rockers, MD as Consulting Physician (Pulmonary Disease) Allyn Kenner, MD (Dermatology) Deliah Goody, PA-C as Physician Assistant (Physician Assistant) Wrenn Willcox X, NP as Nurse Practitioner (Internal Medicine) Ngetich, Nelda Bucks, NP as Nurse Practitioner (Family Medicine)  Extended Emergency Contact Information Primary Emergency Contact: Pogosyan,[name] Address: [place],  [place] Phone: [phone] Mobile Phone: [phone] Relation: Spouse Secondary Emergency Contact: [name],Heilman  [place] Mobile Phone: [phone] Relation: Daughter  Code Status:  Full Code Goals of care: Advanced Directive information Advanced Directives 11/17/2018  Does Patient Have a Medical Advance Directive? Yes  Type of Paramedic of [place];Living will  Does patient want to make changes to medical advance directive? No - Patient declined  Copy of [place] in Chart? Yes - validated most recent copy scanned in chart (See row information)  Would patient like information on creating a medical advance directive? -  Pre-existing out of facility DNR order (yellow form or pink MOST form) -     Chief Complaint  Patient presents with  . Acute Visit    Shingrix    HPI:  Pt is a 85 y.o. male seen today for an acute visit for Shingrix prescription (CDC preferred, age >50 years, received Zostavax 2007). DAPT, Hx of CVA, CABG 2011,  no apparent focal weakness residual  presently. Taking ASA, Plavix. Hx of VT, heart rate is in control, on Amiodarone 100mg  qd. CAD, stable, taking ASA, Statin. CHF, chronic edema BLE, compensated on Torsemide 10mg  qd.   Past Medical History:  Diagnosis Date  . Arthritis    "minor, back and sometimes knees" (12/15/2012)  . Bradycardia    AFib/SSS s/p St Jude PPM 04/12/2008  . CAD (coronary artery disease) 12/30/2014   CABG (LIMA-LAD, SVG-RCA, SVG-OM in 1996).  07/2009 BMS to SVG-RCA. Cath in 04/2010 with patent stents   . Cardiomyopathy, ischemic 08/25/2012  . CHF (congestive heart failure) ([place])   . Chronic knee pain 12/03/2014  . Combined congestive systolic and diastolic heart failure ([place]) 02/02/2015   Hx EF 41%. BNP 96.8 02/21/15 Torsemide 04/06/15 Na 142, K 4.6, Bun 16, creat 0.89 04/20/14 BNP 111.7, Na 142, K 4.6, Bun 16, creat 0.9   . Depression with anxiety 02/02/2015   02/21/15 Hgb A1c 5.8 03/10/15 MMSE 30/30   . Dizziness, after diuretic asscoiated with hypotension and responded to fluid bolus 06/05/2011   04/28/15 US carotid R+L normal bilateral arterial velocities.    Marland Kitchen Dyspnea 08/11/2014   Followed in Pulmonary clinic/ [facility]/ [facility]  - 08/11/2014  Walked RA x 1 laps @ 185 ft each stopped due to fatigue/off balance/ slow pace/  no sob or desat  - PFT's  09/26/2014  FEV1 2.26 (85 % ) ratio 76  p no % improvement from saba with DLCO  67 % corrects to 93 % for alv volume      Since prev study 08/04/13 minimal change lung vol or dlco    . Embolic cerebral infarction ([place]) 12/06/2015  .  Exertional shortness of breath    "sometimes walking" (12/15/2012)  . GERD (gastroesophageal reflux disease)   . Gout 02/09/2015  . Heart murmur    "just told I had one today" (12/15/2012)  . Hiatal hernia   . Hyperlipidemia   . Hypertension   . Hypothyroid   . Influenza A 03/10/2016  . Insomnia   . Melanoma of back ([place]) 1976  . Myocardial infarction [facility]) 1996; 2011   "both silent" (12/15/2012)  . Nonrheumatic aortic valve stenosis   .  Orthostatic hypotension   . Osteoporosis, senile   . Pacemaker   . RBBB   . Restless leg 02/02/2015  . Right leg weakness 12/06/2015  . [facility] spotted fever   . S/P CABG x 4   . Sick sinus syndrome ([place]) 01/31/2014  . Sustained ventricular tachycardia ([place]) 07/27/2014  . Urinary retention 12/30/2014   has a cathrater   Past Surgical History:  Procedure Laterality Date  . CARDIAC CATHETERIZATION  04/2010   LIMA to LAD patent,SVG to OM patent,no in-stnet restenosis RCA  . CATARACT EXTRACTION W/ INTRAOCULAR LENS  IMPLANT, BILATERAL Bilateral 2012  . CORONARY ANGIOPLASTY WITH STENT PLACEMENT  07/2009   bare metal stent to SVG to the RCA  . CORONARY ARTERY BYPASS GRAFT  1996   LIMA to LAD,SVG to RCA & SVG to OM  . INSERT / REPLACE / REMOVE PACEMAKER  2010  . MELANOMA EXCISION  05/1974 X2   "taken off my back" (12/15/2012)  . NM MYOVIEW LTD  06/2011   low risk  . PPM GENERATOR CHANGEOUT N/A 01/22/2017   Procedure: PPM GENERATOR CHANGEOUT;  Surgeon: [name], MD;  Location: [facility];  Service: Cardiovascular;  Laterality: N/A;  . TONSILLECTOMY  1938  . TRANSURETHRAL RESECTION OF PROSTATE  1986  . US ECHOCARDIOGRAPHY  07/11/2009   EF 45-50%    Allergies  Allergen Reactions  . Altace [Ramipril] Cough  . Crestor [Rosuvastatin Calcium] Rash  . Penicillins Rash and Other (See Comments)    Has patient had a PCN reaction causing immediate rash, facial/tongue/throat swelling, SOB or lightheadedness with hypotension: Yes Has patient had a PCN reaction causing severe rash involving mucus membranes or skin necrosis: No Has patient had a PCN reaction that required hospitalization No Has patient had a PCN reaction occurring within the last 10 years: No If all of the above answers are "NO", then may proceed with Cephalosporin use.   . Sulfa Antibiotics Rash  . Sulfamethoxazole-Trimethoprim Rash    Allergies as of 12/17/2018      Reactions   Altace [ramipril] Cough    Crestor [rosuvastatin Calcium] Rash   Penicillins Rash, Other (See Comments)   Has patient had a PCN reaction causing immediate rash, facial/tongue/throat swelling, SOB or lightheadedness with hypotension: Yes Has patient had a PCN reaction causing severe rash involving mucus membranes or skin necrosis: No Has patient had a PCN reaction that required hospitalization No Has patient had a PCN reaction occurring within the last 10 years: No If all of the above answers are "NO", then may proceed with Cephalosporin use.   Sulfa Antibiotics Rash   Sulfamethoxazole-trimethoprim Rash      Medication List       Accurate as of December 17, 2018  3:57 PM. If you have any questions, ask your nurse or doctor.        STOP taking these medications   Decubi-Vite Caps Stopped by: Zyheir Daft X Niva Murren, NP   mirtazapine 7.5 MG  tablet Commonly known as: REMERON Stopped by: Lewayne Pauley X Aysen Shieh, NP     TAKE these medications   acetaminophen 500 MG tablet Commonly known as: TYLENOL Take 500 mg by mouth 3 (three) times daily.   amiodarone 100 MG tablet Commonly known as: PACERONE Take 1 tablet (100 mg total) by mouth daily.   aspirin EC 81 MG tablet Take 1 tablet (81 mg total) by mouth daily.   clopidogrel 75 MG tablet Commonly known as: PLAVIX TAKE 1 TABLET ONCE DAILY.   clotrimazole-betamethasone cream Commonly known as: LOTRISONE Apply 1 application topically daily as needed.   DULoxetine 20 MG capsule Commonly known as: CYMBALTA Take 20 mg by mouth daily. For anxiety,insomnia and back pain   feeding supplement (BOOST BREEZE) Liqd Take by mouth 2 (two) times daily.   feeding supplement (PRO-STAT SUGAR FREE 64) Liqd Take 30 mLs by mouth 2 (two) times a day.   fluticasone 0.05 % cream Commonly known as: CUTIVATE Apply topically 2 (two) times daily as needed. Apply small amount on  face and back also can apply as needed   fluticasone 50 MCG/ACT nasal spray Commonly known as: FLONASE Place 1 spray  into both nostrils 2 (two) times daily.   guaiFENesin 100 MG/5ML Soln Commonly known as: ROBITUSSIN Take 5 mLs (100 mg total) by mouth every 4 (four) hours as needed for cough or to loosen phlegm.   ketoconazole 2 % cream Commonly known as: NIZORAL Apply 1 application topically as needed for irritation. Apply small amount behind ears and in creases beside nose BID x 7 days. MAY REPEAT PRN. (Family supplied).   levothyroxine 100 MCG tablet Commonly known as: SYNTHROID Take 100 mcg by mouth daily before breakfast.   meclizine 25 MG tablet Commonly known as: ANTIVERT Take 1 tablet (25 mg total) by mouth 2 (two) times daily as needed for dizziness.   Melatonin 3 MG Tabs Take 3 mg by mouth at bedtime.   methocarbamol 500 MG tablet Commonly known as: ROBAXIN Take 250 mg by mouth 2 (two) times daily.   midodrine 5 MG tablet Commonly known as: PROAMATINE Take 5 mg by mouth 3 (three) times daily with meals. Notify provider if BP is < 90/60.   [name] 100 MG Caps Generic drug: Droxidopa TAKE 1 CAPSULE BY MOUTH THREE TIMES DAILY. TAKE LAST DOSE AT LEAST 3 HOURS BEFORE BEDTIME.   pantoprazole 40 MG tablet Commonly known as: PROTONIX Take 40 mg by mouth daily. What changed: Another medication with the same name was removed. Continue taking this medication, and follow the directions you see here. Changed by: Haidar Muse X Jewelianna Pancoast, NP   Plecanatide 3 MG Tabs Commonly known as: Trulance Take 3 mg by mouth daily.   polyethylene glycol 17 g packet Commonly known as: MIRALAX / GLYCOLAX Take 17 g by mouth daily as needed. Take 1-2 capfuls in 8 oz of H2O   pravastatin 40 MG tablet Commonly known as: PRAVACHOL Take 1 tablet (40 mg total) by mouth at bedtime.   rOPINIRole 0.25 MG tablet Commonly known as: REQUIP Take 0.5 mg by mouth 2 (two) times daily.   rOPINIRole 0.5 MG tablet Commonly known as: REQUIP Take 0.5 mg by mouth daily as needed.   sennosides-docusate sodium 8.6-50 MG tablet  Commonly known as: SENOKOT-S Take 2 tablets by mouth 2 (two) times daily.   torsemide 10 MG tablet Commonly known as: DEMADEX Take 1 tablet (10 mg total) by mouth daily.   Vitamin D3 125 MCG (5000 UT) Caps  Take 5,000 Units by mouth every Monday.   zinc oxide 20 % ointment 1 application. Apply to buttocks after each incontinent episode and as needed for redness. May keep at bedside. As Needed      ROS was provided with assistance of staff.  Review of Systems  Constitutional: Negative for activity change, appetite change, chills, diaphoresis, fatigue and fever.  HENT: Positive for hearing loss. Negative for congestion and voice change.   Respiratory: Negative for cough, shortness of breath and wheezing.   Cardiovascular: Positive for leg swelling. Negative for chest pain and palpitations.  Gastrointestinal: Negative for abdominal distention, abdominal pain, constipation, diarrhea and vomiting.  Genitourinary: Positive for difficulty urinating.       Foley  Musculoskeletal: Positive for arthralgias and gait problem.  Skin: Negative for color change and pallor.  Neurological: Positive for speech difficulty. Negative for dizziness, weakness and headaches.       Memory lapses. Expressive aphasia.   Psychiatric/Behavioral: Negative for agitation, behavioral problems, hallucinations and sleep disturbance. The patient is not nervous/anxious.     Immunization History  Administered Date(s) Administered  . Influenza, High Dose Seasonal PF 11/01/2014, 11/13/2016  . Influenza,inj,Quad PF,6+ Mos 11/24/2017  . Influenza,inj,quad, With Preservative 11/17/2017  . Influenza-Unspecified 12/05/2013, 11/05/2014, 11/16/2015  . PPD Test 03/07/2014  . Pneumococcal Conjugate-13 10/05/2009  . Pneumococcal Polysaccharide-23 11/05/2005  . Pneumococcal-Unspecified 10/05/2009  . Tdap 08/02/2014  . Zoster 11/05/2005   Pertinent  Health Maintenance Due  Topic Date Due  . PNA vac Low Risk Adult (2 of 2  - PCV13) 10/06/2010  . INFLUENZA VACCINE  09/05/2018  . DEXA SCAN  12/14/2024   Fall Risk  10/09/2017 10/02/2016 10/30/2015 09/05/2015 02/02/2015  Falls in the past year? No No No No No  Risk for fall due to : - - Impaired balance/gait - -   Functional Status Survey:    Vitals:   12/17/18 1027  BP: 127/72  Pulse: 72  Resp: 20  Temp: (!) 97.3 F (36.3 C)  SpO2: 96%  Weight: 182 lb 8 oz (82.8 kg)  Height: 5\' 10"  (1.778 m)   Body mass index is 26.19 kg/m. Physical Exam Vitals signs and nursing note reviewed.  Constitutional:      General: He is not in acute distress.    Appearance: Normal appearance. He is not ill-appearing, toxic-appearing or diaphoretic.  HENT:     Head: Normocephalic and atraumatic.     Nose: Nose normal.     Mouth/Throat:     Mouth: Mucous membranes are moist.  Eyes:     Extraocular Movements: Extraocular movements intact.     Conjunctiva/sclera: Conjunctivae normal.     Pupils: Pupils are equal, round, and reactive to light.  Neck:     Musculoskeletal: Normal range of motion and neck supple.  Cardiovascular:     Rate and Rhythm: Normal rate and regular rhythm.     Heart sounds: Murmur present.  Pulmonary:     Breath sounds: Rales present.     Comments: Bibasilar rales. Abdominal:     General: There is no distension.     Palpations: Abdomen is soft.     Tenderness: There is no abdominal tenderness. There is no left CVA tenderness, guarding or rebound.  Genitourinary:    Comments: Foley Musculoskeletal:     Right lower leg: Edema present.     Left lower leg: Edema present.     Comments: Trace edema BLE  Skin:    General: Skin is warm and dry.  Neurological:     General: No focal deficit present.     Mental Status: He is alert. Mental status is at baseline.     Cranial Nerves: No cranial nerve deficit.     Motor: No weakness.     Coordination: Coordination normal.     Gait: Gait abnormal.     Comments: Oriented to person, place.    Psychiatric:        Mood and Affect: Mood normal.        Behavior: Behavior normal.        Thought Content: Thought content normal.        Judgment: Judgment normal.     Labs reviewed: Recent Labs    01/15/18 1207 01/18/18 1657 01/19/18 05/09/18 09/23/18  NA 139 139 139 138 141  K 4.1 3.8 4.6 3.6 4.1  CL 101 104  --   --   --   CO2 24 26  --   --   --   GLUCOSE 100* 127*  --   --   --   BUN 15 17 16 18 18   CREATININE 0.75* 0.73 0.7 0.8 0.7  CALCIUM 8.9 8.3*  --   --   --    Recent Labs    12/30/17 01/18/18 1657 05/09/18 09/23/18  AST 21 20 18 23   ALT 12 13 15 14   ALKPHOS 92 91  --  64  BILITOT  --  0.5  --   --   PROT  --  6.7  --   --   ALBUMIN  --  3.2*  --   --    Recent Labs    01/18/18 1657 05/09/18 09/23/18  WBC 7.9 10.8 6.2  NEUTROABS 5.4  --   --   HGB 11.6* 11.5* 12.7*  HCT 36.9* 33* 38*  MCV 98.4  --   --   PLT 260 202 234   Lab Results  Component Value Date   TSH 2.03 08/27/2018   Lab Results  Component Value Date   HGBA1C 5.6 12/07/2015   Lab Results  Component Value Date   CHOL 93 03/30/2018   HDL 35 03/30/2018   LDLCALC 37 03/30/2018   TRIG 127 03/30/2018   CHOLHDL 2.6 05/05/2017    Significant Diagnostic Results in last 30 days:  No results found.  Assessment/Plan History of arterial ischemic stroke Hx of CVA/TIA, currently on DAPT ASA 81mg  qd, Plavix 75mg  qd. Benefit of ASA and Plavix dual therapy must be balanced against hemorrhage risk. Up To Date: DAPT as benefit appears to be most pronounced in the short term period post stroke/TIA. Will recommend dc Plavix if HPOA consents.   Ventricular tachycardia (HCC) Heart rate is in control, continue Amiodarone 100mg  qd.   Coronary artery disease involving native coronary artery of native heart without angina pectoris Stable, s/p CABG 2011, s/p stent per history,  continue ASA, Statin, dc Plavix if HPOA consents.   Combined congestive systolic and diastolic heart failure (HCC)  Chronic edema BLE, continue Torsemide.      Family/ staff Communication: plan of care reviewed with the patient and charge nurse. Shingrix prescription provided(age >50 years, received Zostavax 2007)  Labs/tests ordered:  none  Time spend 25 minutes.

## 2018-12-18 ENCOUNTER — Telehealth: Payer: Self-pay | Admitting: Cardiovascular Disease

## 2018-12-18 NOTE — Telephone Encounter (Signed)
New Message  [facility] was calling in regards to the patient discontinuing clopidogrel (PLAVIX) 75 MG tablet wanting to know if it is okay with Dr. Sallyanne Kuster. Please call to discuss.

## 2018-12-18 NOTE — Telephone Encounter (Signed)
Left message to call back to call back and ask for triage

## 2018-12-22 NOTE — Telephone Encounter (Signed)
Left message for [facility] to call

## 2018-12-24 NOTE — Telephone Encounter (Signed)
LM #3 for Adam [name] at [facility]... she is the Care Coordinator. Asked for her to call and ask for triage.

## 2019-01-05 ENCOUNTER — Encounter: Payer: Self-pay | Admitting: Nurse Practitioner

## 2019-01-05 ENCOUNTER — Non-Acute Institutional Stay (SKILLED_NURSING_FACILITY): Payer: Medicare Other | Admitting: Nurse Practitioner

## 2019-01-05 DIAGNOSIS — I951 Orthostatic hypotension: Secondary | ICD-10-CM | POA: Diagnosis not present

## 2019-01-05 DIAGNOSIS — M159 Polyosteoarthritis, unspecified: Secondary | ICD-10-CM

## 2019-01-05 DIAGNOSIS — K5909 Other constipation: Secondary | ICD-10-CM | POA: Diagnosis not present

## 2019-01-05 DIAGNOSIS — F339 Major depressive disorder, recurrent, unspecified: Secondary | ICD-10-CM

## 2019-01-05 DIAGNOSIS — I472 Ventricular tachycardia, unspecified: Secondary | ICD-10-CM

## 2019-01-05 DIAGNOSIS — E032 Hypothyroidism due to medicaments and other exogenous substances: Secondary | ICD-10-CM

## 2019-01-05 DIAGNOSIS — G2581 Restless legs syndrome: Secondary | ICD-10-CM

## 2019-01-05 DIAGNOSIS — K219 Gastro-esophageal reflux disease without esophagitis: Secondary | ICD-10-CM

## 2019-01-05 NOTE — Assessment & Plan Note (Signed)
Stable, continue Midodrine 5mg  tid, Doxidopa 100mg  tid

## 2019-01-05 NOTE — Assessment & Plan Note (Signed)
Managed, continue continue Requip.

## 2019-01-05 NOTE — Assessment & Plan Note (Signed)
Stable, continue Levothyroxine 122mcg qd, TSH 2.04 08/27/18

## 2019-01-05 NOTE — Assessment & Plan Note (Signed)
Multiple sites, continue Robaxine 250mg  bid.

## 2019-01-05 NOTE — Assessment & Plan Note (Signed)
His mood is stable, continue Duloxetine 20mg  qd.

## 2019-01-05 NOTE — Assessment & Plan Note (Signed)
Heart rate is in control, continue Amiodarone.  

## 2019-01-05 NOTE — Assessment & Plan Note (Signed)
Stable, continue Protonix 40mg qd.  

## 2019-01-05 NOTE — Assessment & Plan Note (Signed)
Stable, continue  Senokot S II bid, prn MiraLax, Trulance 3mg  qd.

## 2019-01-05 NOTE — Progress Notes (Signed)
Location:   SNF [facility] Room Number: [id number] Place of Service:  SNF (31) Provider: Lennie Odor Aarini Slee NP  [name], MD  Patient Care Team: [name], MD as PCP - General (Internal Medicine) [name], MD as PCP - Cardiology (Cardiology) [name], MD as Attending Physician (Cardiology) Vevelyn Royals, MD as Consulting Physician (Ophthalmology) Franchot Gallo, MD as Consulting Physician (Urology) Tanda Rockers, MD as Consulting Physician (Pulmonary Disease) Allyn Kenner, MD (Dermatology) Deliah Goody, PA-C as Physician Assistant (Physician Assistant) Kobe Jansma X, NP as Nurse Practitioner (Internal Medicine) Ngetich, Nelda Bucks, NP as Nurse Practitioner (Family Medicine)  Extended Emergency Contact Information Primary Emergency Contact: Silliman,[name] Address: [place], [place] Phone: [phone] Mobile Phone: [phone] Relation: Spouse Secondary Emergency Contact: [name],Bugh  [place] Mobile Phone: [phone] Relation: Daughter  Code Status:  DNR Goals of care: Advanced Directive information Advanced Directives 11/17/2018  Does Patient Have a Medical Advance Directive? Yes  Type of Paramedic of [place];Living will  Does patient want to make changes to medical advance directive? No - Patient declined  Copy of Export in Chart? Yes - validated most recent copy scanned in chart (See row information)  Would patient like information on creating a medical advance directive? -  Pre-existing out of facility DNR order (yellow form or pink MOST form) -     Chief Complaint  Patient presents with   Medical Management of Chronic Issues    HPI:  Pt is a 85 y.o. male seen today for medical management of chronic diseases.    The patient resides in SNF [facility] for safety, care assistance, w/c for mobility. Constipation, stable, on Senokot S  II bid, prn MiraLax, Trulance 3mg  qd. GERD, stable, on Protonix 40mg  qd. Orthostatic hypotension, stable, on Midodrine 5mg  tid, Doxidopa 100mg  tid. Lower back pain/miltiple sites, stable, on Robaxine 250mg  bid. Hypothyroidism, stable, on Levothyroxine 160mcg qd. His mood is stable, on Duloxetine 20mg  qd. RLS, stable on Requip 0.5mg  bid, qd prn.  V-tach, heart rate is in control, on Amiodarone 100mg  qd.   Past Medical History:  Diagnosis Date   Arthritis    "minor, back and sometimes knees" (12/15/2012)   Bradycardia    AFib/SSS s/p St Jude PPM 04/12/2008   CAD (coronary artery disease) 12/30/2014   CABG (LIMA-LAD, SVG-RCA, SVG-OM in 1996).  07/2009 BMS to SVG-RCA. 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 BNP 96.8 02/21/15 Torsemide 04/06/15 Na 142, K 4.6, Bun 16, creat 0.89 04/20/14 BNP 111.7, Na 142, K 4.6, Bun 16, creat 0.9    Depression with anxiety 02/02/2015   02/21/15 Hgb A1c 5.8 03/10/15 MMSE 30/30    Dizziness, after diuretic asscoiated with hypotension and responded to fluid bolus 06/05/2011   04/28/15 US carotid R+L normal bilateral arterial velocities.     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   Exertional shortness of breath    "sometimes walking" (12/15/2012)   GERD (gastroesophageal reflux disease)    Gout 02/09/2015   Heart murmur    "just told I had one today" (12/15/2012)   Hiatal hernia    Hyperlipidemia      Hypertension    Hypothyroid    Influenza A 03/10/2016   Insomnia    Melanoma of back ([place]) 1976   Myocardial infarction ([place]) 1996; 2011   "both silent" (12/15/2012)   Nonrheumatic aortic valve stenosis    Orthostatic hypotension    Osteoporosis, senile    Pacemaker    RBBB    Restless leg 02/02/2015   Right leg weakness 12/06/2015   [facility] spotted fever    S/P CABG x 4    Sick sinus syndrome ([place]) 01/31/2014   Sustained ventricular tachycardia ([place]) 07/27/2014   Urinary retention 12/30/2014   has a cathrater   Past Surgical History:  Procedure Laterality Date   CARDIAC CATHETERIZATION  04/2010   LIMA to LAD patent,SVG to OM patent,no in-stnet restenosis RCA   CATARACT EXTRACTION W/ INTRAOCULAR LENS  IMPLANT, BILATERAL Bilateral 2012   CORONARY ANGIOPLASTY WITH STENT PLACEMENT  07/2009   bare metal stent to SVG to the RCA   CORONARY ARTERY BYPASS GRAFT  1996   LIMA to LAD,SVG to RCA & SVG to OM   INSERT / REPLACE / REMOVE PACEMAKER  2010   MELANOMA EXCISION  05/1974 X2   "taken off my back" (12/15/2012)   NM MYOVIEW LTD  06/2011   low risk   PPM GENERATOR CHANGEOUT N/A 01/22/2017   Procedure: PPM GENERATOR CHANGEOUT;  Surgeon: [name], MD;  Location: [facility];  Service: Cardiovascular;  Laterality: N/A;   TONSILLECTOMY  1938   TRANSURETHRAL RESECTION OF PROSTATE  1986   US ECHOCARDIOGRAPHY  07/11/2009   EF 45-50%    Allergies  Allergen Reactions   Altace [Ramipril] Cough   Crestor [Rosuvastatin Calcium] Rash   Penicillins Rash and Other (See Comments)    Has patient had a PCN reaction causing immediate rash, facial/tongue/throat swelling, SOB or lightheadedness with hypotension: Yes Has patient had a PCN reaction causing severe rash involving mucus membranes or skin necrosis: No Has patient had a PCN reaction that required hospitalization No Has patient had a PCN reaction occurring within the last 10 years: No If  all of the above answers are "NO", then may proceed with Cephalosporin use.    Sulfa Antibiotics Rash   Sulfamethoxazole-Trimethoprim Rash    Allergies as of 01/05/2019      Reactions   Altace [ramipril] Cough   Crestor [rosuvastatin Calcium] Rash   Penicillins Rash, Other (See Comments)   Has patient had a PCN reaction causing immediate rash, facial/tongue/throat swelling, SOB or lightheadedness with hypotension: Yes Has patient had a PCN reaction causing severe rash involving mucus membranes or skin necrosis: No Has patient had a PCN reaction that required hospitalization No Has patient had a PCN reaction occurring within the last 10 years: No If all of the above answers are "NO", then may proceed with Cephalosporin use.   Sulfa Antibiotics Rash   Sulfamethoxazole-trimethoprim Rash      Medication List       Accurate as of January 05, 2019  2:24 PM. If you  have any questions, ask your nurse or doctor.        acetaminophen 500 MG tablet Commonly known as: TYLENOL Take 500 mg by mouth 3 (three) times daily.   amiodarone 100 MG tablet Commonly known as: PACERONE Take 1 tablet (100 mg total) by mouth daily.   aspirin EC 81 MG tablet Take 1 tablet (81 mg total) by mouth daily.   clopidogrel 75 MG tablet Commonly known as: PLAVIX TAKE 1 TABLET ONCE DAILY.   clotrimazole-betamethasone cream Commonly known as: LOTRISONE Apply 1 application topically daily as needed.   DULoxetine 20 MG capsule Commonly known as: CYMBALTA Take 20 mg by mouth daily. For anxiety,insomnia and back pain   feeding supplement (BOOST BREEZE) Liqd Take by mouth 2 (two) times daily.   feeding supplement (PRO-STAT SUGAR FREE 64) Liqd Take 30 mLs by mouth 2 (two) times a day.   fluticasone 0.05 % cream Commonly known as: CUTIVATE Apply topically 2 (two) times daily as needed. Apply small amount on  face and back also can apply as needed   fluticasone 50 MCG/ACT nasal spray Commonly known  as: FLONASE Place 1 spray into both nostrils 2 (two) times daily.   guaiFENesin 100 MG/5ML Soln Commonly known as: ROBITUSSIN Take 5 mLs (100 mg total) by mouth every 4 (four) hours as needed for cough or to loosen phlegm.   ketoconazole 2 % cream Commonly known as: NIZORAL Apply 1 application topically as needed for irritation. Apply small amount behind ears and in creases beside nose BID x 7 days. MAY REPEAT PRN. (Family supplied).   levothyroxine 100 MCG tablet Commonly known as: SYNTHROID Take 100 mcg by mouth daily before breakfast.   meclizine 25 MG tablet Commonly known as: ANTIVERT Take 1 tablet (25 mg total) by mouth 2 (two) times daily as needed for dizziness.   Melatonin 3 MG Tabs Take 3 mg by mouth at bedtime.   methocarbamol 500 MG tablet Commonly known as: ROBAXIN Take 250 mg by mouth 2 (two) times daily.   midodrine 5 MG tablet Commonly known as: PROAMATINE Take 5 mg by mouth 3 (three) times daily with meals. Notify provider if BP is < 90/60.   [name] 100 MG Caps Generic drug: Droxidopa TAKE 1 CAPSULE BY MOUTH THREE TIMES DAILY. TAKE LAST DOSE AT LEAST 3 HOURS BEFORE BEDTIME.   pantoprazole 40 MG tablet Commonly known as: PROTONIX Take 40 mg by mouth daily.   Plecanatide 3 MG Tabs Commonly known as: Trulance Take 3 mg by mouth daily.   polyethylene glycol 17 g packet Commonly known as: MIRALAX / GLYCOLAX Take 17 g by mouth daily as needed. Take 1-2 capfuls in 8 oz of H2O   pravastatin 40 MG tablet Commonly known as: PRAVACHOL Take 1 tablet (40 mg total) by mouth at bedtime.   rOPINIRole 0.25 MG tablet Commonly known as: REQUIP Take 0.5 mg by mouth 2 (two) times daily.   rOPINIRole 0.5 MG tablet Commonly known as: REQUIP Take 0.5 mg by mouth daily as needed.   sennosides-docusate sodium 8.6-50 MG tablet Commonly known as: SENOKOT-S Take 2 tablets by mouth 2 (two) times daily.   torsemide 10 MG tablet Commonly known as: DEMADEX Take 1  tablet (10 mg total) by mouth daily.   Vitamin D3 125 MCG (5000 UT) Caps Take 5,000 Units by mouth every Monday.   zinc oxide 20 % ointment 1 application. Apply to buttocks after each incontinent episode and as needed for redness. May keep at bedside.  As Needed      ROS was provided with assistance of staff.  Review of Systems  Constitutional: Negative for activity change, appetite change, chills, diaphoresis, fatigue and fever.  HENT: Positive for hearing loss. Negative for congestion and voice change.   Eyes: Negative for visual disturbance.  Respiratory: Negative for cough, shortness of breath and wheezing.   Cardiovascular: Positive for leg swelling. Negative for chest pain and palpitations.  Gastrointestinal: Negative for abdominal distention, constipation, diarrhea, nausea and vomiting.  Genitourinary: Positive for difficulty urinating.       Foley  Musculoskeletal: Positive for arthralgias, back pain and gait problem.  Skin: Negative for color change and pallor.  Neurological: Positive for speech difficulty. Negative for dizziness, weakness and headaches.       Memory lapses. Expressive aphasia.   Psychiatric/Behavioral: Negative for agitation, behavioral problems, hallucinations and sleep disturbance. The patient is not nervous/anxious.     Immunization History  Administered Date(s) Administered   Influenza, High Dose Seasonal PF 11/01/2014, 11/13/2016   Influenza,inj,Quad PF,6+ Mos 11/24/2017   Influenza,inj,quad, With Preservative 11/17/2017   Influenza-Unspecified 12/05/2013, 11/05/2014, 11/16/2015   PPD Test 03/07/2014   Pneumococcal Conjugate-13 10/05/2009   Pneumococcal Polysaccharide-23 11/05/2005   Pneumococcal-Unspecified 10/05/2009   Tdap 08/02/2014   Zoster 11/05/2005   Pertinent  Health Maintenance Due  Topic Date Due   PNA vac Low Risk Adult (2 of 2 - PCV13) 10/06/2010   INFLUENZA VACCINE  09/05/2018   DEXA SCAN  12/14/2024   Fall Risk   10/09/2017 10/02/2016 10/30/2015 09/05/2015 02/02/2015  Falls in the past year? No No No No No  Risk for fall due to : - - Impaired balance/gait - -   Functional Status Survey:    Vitals:   01/05/19 1404  BP: 132/78  Pulse: 71  Resp: 16  Temp: 98 F (36.7 C)  SpO2: 94%   There is no height or weight on file to calculate BMI. Physical Exam Vitals signs and nursing note reviewed.  Constitutional:      General: He is not in acute distress.    Appearance: Normal appearance. He is not ill-appearing, toxic-appearing or diaphoretic.  HENT:     Head: Normocephalic and atraumatic.     Nose: Nose normal.     Mouth/Throat:     Mouth: Mucous membranes are moist.  Eyes:     Extraocular Movements: Extraocular movements intact.     Conjunctiva/sclera: Conjunctivae normal.     Pupils: Pupils are equal, round, and reactive to light.  Neck:     Musculoskeletal: Normal range of motion and neck supple.  Cardiovascular:     Rate and Rhythm: Normal rate and regular rhythm.     Heart sounds: Murmur present.  Pulmonary:     Breath sounds: No wheezing, rhonchi or rales.  Abdominal:     General: Bowel sounds are normal. There is no distension.     Palpations: Abdomen is soft.     Tenderness: There is no abdominal tenderness. There is no right CVA tenderness, left CVA tenderness, guarding or rebound.  Genitourinary:    Comments: Foley Musculoskeletal:     Right lower leg: Edema present.     Left lower leg: Edema present.     Comments: Trace edema BLE  Skin:    General: Skin is warm and dry.  Neurological:     General: No focal deficit present.     Mental Status: He is alert. Mental status is at baseline.     Motor: No  weakness.     Coordination: Coordination normal.     Gait: Gait abnormal.     Comments: Oriented to person, place.   Psychiatric:        Mood and Affect: Mood normal.        Behavior: Behavior normal.        Thought Content: Thought content normal.     Labs  reviewed: Recent Labs    01/15/18 1207 01/18/18 1657 01/19/18 05/09/18 09/23/18  NA 139 139 139 138 141  K 4.1 3.8 4.6 3.6 4.1  CL 101 104  --   --   --   CO2 24 26  --   --   --   GLUCOSE 100* 127*  --   --   --   BUN 15 17 16 18 18   CREATININE 0.75* 0.73 0.7 0.8 0.7  CALCIUM 8.9 8.3*  --   --   --    Recent Labs    01/18/18 1657 05/09/18 09/23/18  AST 20 18 23   ALT 13 15 14   ALKPHOS 91  --  64  BILITOT 0.5  --   --   PROT 6.7  --   --   ALBUMIN 3.2*  --   --    Recent Labs    01/18/18 1657 05/09/18 09/23/18  WBC 7.9 10.8 6.2  NEUTROABS 5.4  --   --   HGB 11.6* 11.5* 12.7*  HCT 36.9* 33* 38*  MCV 98.4  --   --   PLT 260 202 234   Lab Results  Component Value Date   TSH 2.03 08/27/2018   Lab Results  Component Value Date   HGBA1C 5.6 12/07/2015   Lab Results  Component Value Date   CHOL 93 03/30/2018   HDL 35 03/30/2018   LDLCALC 37 03/30/2018   TRIG 127 03/30/2018   CHOLHDL 2.6 05/05/2017    Significant Diagnostic Results in last 30 days:  No results found.  Assessment/Plan Ventricular tachycardia (HCC) Heart rate is in control, continue Amiodarone  Orthostatic hypotension Stable, continue Midodrine 5mg  tid, Doxidopa 100mg  tid  GERD (gastroesophageal reflux disease) Stable, continue Protonix 40mg  qd.   Chronic constipation Stable, continue  Senokot S II bid, prn MiraLax, Trulance 3mg  qd.  Hypothyroidism Stable, continue Levothyroxine 175mcg qd, TSH 2.04 08/27/18  Osteoarthritis Multiple sites, continue Robaxine 250mg  bid.  RLS (restless legs syndrome) Managed, continue continue Requip.   Depression, recurrent ([place]) His mood is stable, continue Duloxetine 20mg  qd.    Family/ staff Communication: plan of care reviewed with the patient and charge nurse.   Labs/tests ordered:  none  Time spend 25 minutes.

## 2019-01-19 ENCOUNTER — Non-Acute Institutional Stay (SKILLED_NURSING_FACILITY): Payer: Medicare Other | Admitting: Nurse Practitioner

## 2019-01-19 ENCOUNTER — Encounter: Payer: Self-pay | Admitting: Nurse Practitioner

## 2019-01-19 DIAGNOSIS — M25511 Pain in right shoulder: Secondary | ICD-10-CM | POA: Diagnosis not present

## 2019-01-19 DIAGNOSIS — K219 Gastro-esophageal reflux disease without esophagitis: Secondary | ICD-10-CM

## 2019-01-19 DIAGNOSIS — I472 Ventricular tachycardia, unspecified: Secondary | ICD-10-CM

## 2019-01-19 DIAGNOSIS — I5040 Unspecified combined systolic (congestive) and diastolic (congestive) heart failure: Secondary | ICD-10-CM

## 2019-01-19 DIAGNOSIS — I951 Orthostatic hypotension: Secondary | ICD-10-CM | POA: Diagnosis not present

## 2019-01-19 DIAGNOSIS — M25512 Pain in left shoulder: Secondary | ICD-10-CM

## 2019-01-19 NOTE — Assessment & Plan Note (Signed)
Heart rate is in control, continue Amiodarone.  

## 2019-01-19 NOTE — Assessment & Plan Note (Signed)
Controlled, continue Midodrine, Droxidopa.

## 2019-01-19 NOTE — Assessment & Plan Note (Signed)
Sable, continue Pantoprazole.

## 2019-01-19 NOTE — Progress Notes (Signed)
Location:   [place] Room Number: 29 Place of Service:  SNF (31) Provider:  Ilissa Rosner NP  [name], MD  Patient Care Team: [name], MD as PCP - General (Internal Medicine) [name], MD as PCP - Cardiology (Cardiology) [name], MD as Attending Physician (Cardiology) Vevelyn Royals, MD as Consulting Physician (Ophthalmology) Franchot Gallo, MD as Consulting Physician (Urology) Tanda Rockers, MD as Consulting Physician (Pulmonary Disease) Allyn Kenner, MD (Dermatology) Deliah Goody, PA-C as Physician Assistant (Physician Assistant) Avner Stroder X, NP as Nurse Practitioner (Internal Medicine) Ngetich, Nelda Bucks, NP as Nurse Practitioner (Family Medicine)  Extended Emergency Contact Information Primary Emergency Contact: Longbottom,[name] Address: [place], [place] Phone: [phone] Mobile Phone: [phone] Relation: Spouse Secondary Emergency Contact: [name],Abramo  [place] Mobile Phone: [phone] Relation: Daughter  Code Status:  Full Code Goals of care: Advanced Directive information Advanced Directives 11/17/2018  Does Patient Have a Medical Advance Directive? Yes  Type of Paramedic of [place];Living will  Does patient want to make changes to medical advance directive? No - Patient declined  Copy of [place] in Chart? Yes - validated most recent copy scanned in chart (See row information)  Would patient like information on creating a medical advance directive? -  Pre-existing out of facility DNR order (yellow form or pink MOST form) -     Chief Complaint  Patient presents with  . Acute Visit    Chest pain    HPI:  Pt is a 85 y.o. male seen today for an acute visit for right shoulder pain with ROM x 3 days, otherwise he can feel it, but not pain. CXR 01/08/19 unremarkable. Hx of OA, taking Tylenol  500mg  tid, Robaxin 250mg  bid. CHF, compensated, trace edema BLE, on Torsemide 10mg  qd. GERD, stable, on Pantoprazole 40mg  qd. V-tach, SSS, heart rate is in control, on Amiodarone 100mg  qd, pace maker. Hypotension, managed on Droxidopa 100mg  tid, Midodrine 5mg  tid.    Past Medical History:  Diagnosis Date  . Arthritis    "minor, back and sometimes knees" (12/15/2012)  . Bradycardia    AFib/SSS s/p St Jude PPM 04/12/2008  . CAD (coronary artery disease) 12/30/2014   CABG (LIMA-LAD, SVG-RCA, SVG-OM in 1996).  07/2009 BMS to SVG-RCA. Cath in 04/2010 with patent stents   . Cardiomyopathy, ischemic 08/25/2012  . CHF (congestive heart failure) ([place])   . Chronic knee pain 12/03/2014  . Combined congestive systolic and diastolic heart failure ([place]) 02/02/2015   Hx EF 41%. BNP 96.8 02/21/15 Torsemide 04/06/15 Na 142, K 4.6, Bun 16, creat 0.89 04/20/14 BNP 111.7, Na 142, K 4.6, Bun 16, creat 0.9   . Depression with anxiety 02/02/2015   02/21/15 Hgb A1c 5.8 03/10/15 MMSE 30/30   . Dizziness, after diuretic asscoiated with hypotension and responded to fluid bolus 06/05/2011   04/28/15 US carotid R+L normal bilateral arterial velocities.    Marland Kitchen Dyspnea 08/11/2014   Followed in Pulmonary clinic/ [facility]/ [place]  - 08/11/2014  Walked RA x 1 laps @ 185 ft each stopped due to fatigue/off balance/ slow pace/  no sob or desat  - PFT's  09/26/2014  FEV1 2.26 (85 % ) ratio 76  p no % improvement from saba with DLCO  67 % corrects to 93 % for alv volume      Since prev study  08/04/13 minimal change lung vol or dlco    . Embolic cerebral infarction ([place]) 12/06/2015  . Exertional shortness of breath    "sometimes walking" (12/15/2012)  . GERD (gastroesophageal reflux disease)   . Gout 02/09/2015  . Heart murmur    "just told I had one today" (12/15/2012)  . Hiatal hernia   . Hyperlipidemia   . Hypertension   . Hypothyroid   . Influenza A 03/10/2016  . Insomnia   . Melanoma of back ([place]) 1976  . Myocardial infarction [facility])  1996; 2011   "both silent" (12/15/2012)  . Nonrheumatic aortic valve stenosis   . Orthostatic hypotension   . Osteoporosis, senile   . Pacemaker   . RBBB   . Restless leg 02/02/2015  . Right leg weakness 12/06/2015  . [facility] spotted fever   . S/P CABG x 4   . Sick sinus syndrome ([place]) 01/31/2014  . Sustained ventricular tachycardia ([place]) 07/27/2014  . Urinary retention 12/30/2014   has a cathrater   Past Surgical History:  Procedure Laterality Date  . CARDIAC CATHETERIZATION  04/2010   LIMA to LAD patent,SVG to OM patent,no in-stnet restenosis RCA  . CATARACT EXTRACTION W/ INTRAOCULAR LENS  IMPLANT, BILATERAL Bilateral 2012  . CORONARY ANGIOPLASTY WITH STENT PLACEMENT  07/2009   bare metal stent to SVG to the RCA  . CORONARY ARTERY BYPASS GRAFT  1996   LIMA to LAD,SVG to RCA & SVG to OM  . INSERT / REPLACE / REMOVE PACEMAKER  2010  . MELANOMA EXCISION  05/1974 X2   "taken off my back" (12/15/2012)  . NM MYOVIEW LTD  06/2011   low risk  . PPM GENERATOR CHANGEOUT N/A 01/22/2017   Procedure: PPM GENERATOR CHANGEOUT;  Surgeon: [name], MD;  Location: [place] CV LAB;  Service: Cardiovascular;  Laterality: N/A;  . TONSILLECTOMY  1938  . TRANSURETHRAL RESECTION OF PROSTATE  1986  . US ECHOCARDIOGRAPHY  07/11/2009   EF 45-50%    Allergies  Allergen Reactions  . Altace [Ramipril] Cough  . Crestor [Rosuvastatin Calcium] Rash  . Penicillins Rash and Other (See Comments)    Has patient had a PCN reaction causing immediate rash, facial/tongue/throat swelling, SOB or lightheadedness with hypotension: Yes Has patient had a PCN reaction causing severe rash involving mucus membranes or skin necrosis: No Has patient had a PCN reaction that required hospitalization No Has patient had a PCN reaction occurring within the last 10 years: No If all of the above answers are "NO", then may proceed with Cephalosporin use.   . Sulfa Antibiotics Rash  . Sulfamethoxazole-Trimethoprim  Rash    Allergies as of 01/19/2019      Reactions   Altace [ramipril] Cough   Crestor [rosuvastatin Calcium] Rash   Penicillins Rash, Other (See Comments)   Has patient had a PCN reaction causing immediate rash, facial/tongue/throat swelling, SOB or lightheadedness with hypotension: Yes Has patient had a PCN reaction causing severe rash involving mucus membranes or skin necrosis: No Has patient had a PCN reaction that required hospitalization No Has patient had a PCN reaction occurring within the last 10 years: No If all of the above answers are "NO", then may proceed with Cephalosporin use.   Sulfa Antibiotics Rash   Sulfamethoxazole-trimethoprim Rash      Medication List       Accurate as of January 19, 2019 12:15 PM. If you have any questions, ask your nurse or doctor.        STOP taking  these medications   clopidogrel 75 MG tablet Commonly known as: PLAVIX Stopped by: Merlin Golden X Elanah Osmanovic, NP     TAKE these medications   acetaminophen 500 MG tablet Commonly known as: TYLENOL Take 500 mg by mouth 3 (three) times daily.   acetaminophen 325 MG tablet Commonly known as: TYLENOL Take 650 mg by mouth every 4 (four) hours as needed.   amiodarone 100 MG tablet Commonly known as: PACERONE Take 1 tablet (100 mg total) by mouth daily.   aspirin EC 81 MG tablet Take 1 tablet (81 mg total) by mouth daily.   clotrimazole-betamethasone cream Commonly known as: LOTRISONE Apply 1 application topically daily as needed.   DULoxetine 20 MG capsule Commonly known as: CYMBALTA Take 20 mg by mouth daily. For anxiety,insomnia and back pain   feeding supplement (BOOST BREEZE) Liqd Take by mouth 2 (two) times daily.   feeding supplement (PRO-STAT SUGAR FREE 64) Liqd Take 30 mLs by mouth 2 (two) times a day.   fluticasone 0.05 % cream Commonly known as: CUTIVATE Apply topically 2 (two) times daily as needed. Apply small amount on  face and back also can apply as needed   fluticasone  50 MCG/ACT nasal spray Commonly known as: FLONASE Place 1 spray into both nostrils 2 (two) times daily.   guaiFENesin 100 MG/5ML Soln Commonly known as: ROBITUSSIN Take 5 mLs (100 mg total) by mouth every 4 (four) hours as needed for cough or to loosen phlegm.   ketoconazole 2 % cream Commonly known as: NIZORAL Apply 1 application topically as needed for irritation. Apply small amount behind ears and in creases beside nose BID x 7 days. MAY REPEAT PRN. (Family supplied).   levothyroxine 100 MCG tablet Commonly known as: SYNTHROID Take 100 mcg by mouth daily before breakfast.   meclizine 25 MG tablet Commonly known as: ANTIVERT Take 1 tablet (25 mg total) by mouth 2 (two) times daily as needed for dizziness.   Melatonin 3 MG Tabs Take 3 mg by mouth at bedtime.   methocarbamol 500 MG tablet Commonly known as: ROBAXIN Take 250 mg by mouth 2 (two) times daily.   midodrine 5 MG tablet Commonly known as: PROAMATINE Take 5 mg by mouth 3 (three) times daily with meals. Notify provider if BP is < 90/60.   [name] 100 MG Caps Generic drug: Droxidopa TAKE 1 CAPSULE BY MOUTH THREE TIMES DAILY. TAKE LAST DOSE AT LEAST 3 HOURS BEFORE BEDTIME.   ORAJEL DRY MOUTH MT Use as directed in the mouth or throat. Swish and spit 10 ml 4 x day as needed for sore gums. Continue until dentures fit and/or next dental visit. Four Times A Day - PRN   pantoprazole 40 MG tablet Commonly known as: PROTONIX Take 40 mg by mouth daily.   Plecanatide 3 MG Tabs Commonly known as: Trulance Take 3 mg by mouth daily.   polyethylene glycol 17 g packet Commonly known as: MIRALAX / GLYCOLAX Take 17 g by mouth daily as needed. Take 1-2 capfuls in 8 oz of H2O   pravastatin 40 MG tablet Commonly known as: PRAVACHOL Take 1 tablet (40 mg total) by mouth at bedtime.   rOPINIRole 0.25 MG tablet Commonly known as: REQUIP Take 0.5 mg by mouth 2 (two) times daily.   rOPINIRole 0.5 MG tablet Commonly known as:  REQUIP Take 0.5 mg by mouth daily as needed.   sennosides-docusate sodium 8.6-50 MG tablet Commonly known as: SENOKOT-S Take 2 tablets by mouth 2 (two) times daily.  torsemide 10 MG tablet Commonly known as: DEMADEX Take 1 tablet (10 mg total) by mouth daily.   VARICELLA-ZOSTER IMMUNE GLOB IM Inject into the muscle. suspension for reconstitution; 50 mcg/0.5 mL; amt: 0.5 mL; intramuscular  Special Instructions: Shingrix 0.5 mL IM x 1 then repeat 2 - 6 months. Once - One Time Start taking on: February 17, 2019   Vitamin D3 125 MCG (5000 UT) Caps Take 5,000 Units by mouth every Monday.   zinc oxide 20 % ointment 1 application. Apply to buttocks after each incontinent episode and as needed for redness. May keep at bedside. As Needed      ROS was provided with assistance of staff.  Review of Systems  Constitutional: Negative for activity change, appetite change, chills, diaphoresis, fatigue and fever.  HENT: Positive for hearing loss. Negative for congestion and voice change.   Eyes: Negative for visual disturbance.  Respiratory: Negative for cough, shortness of breath and wheezing.   Cardiovascular: Positive for leg swelling.       Psychologist, forensic.   Gastrointestinal: Negative for abdominal distention, abdominal pain, constipation, diarrhea, nausea and vomiting.  Genitourinary: Negative for dysuria and urgency.       Foley.   Musculoskeletal: Positive for arthralgias and gait problem.       Right shoulder pain with ROM  Skin: Negative for color change and pallor.  Neurological: Negative for dizziness, speech difficulty, weakness and headaches.       Memory lapses.   Psychiatric/Behavioral: Negative for agitation, behavioral problems, hallucinations and sleep disturbance. The patient is not nervous/anxious.     Immunization History  Administered Date(s) Administered  . Influenza, High Dose Seasonal PF 11/01/2014, 11/13/2016  . Influenza,inj,Quad PF,6+ Mos 11/24/2017  .  Influenza,inj,quad, With Preservative 11/17/2017  . Influenza-Unspecified 12/05/2013, 11/05/2014, 11/16/2015  . PPD Test 03/07/2014  . Pneumococcal Conjugate-13 10/05/2009  . Pneumococcal Polysaccharide-23 11/05/2005  . Pneumococcal-Unspecified 10/05/2009  . Tdap 08/02/2014  . Zoster 11/05/2005   Pertinent  Health Maintenance Due  Topic Date Due  . INFLUENZA VACCINE  09/05/2018  . DEXA SCAN  12/14/2024  . PNA vac Low Risk Adult  Completed   Fall Risk  10/09/2017 10/02/2016 10/30/2015 09/05/2015 02/02/2015  Falls in the past year? No No No No No  Risk for fall due to : - - Impaired balance/gait - -   Functional Status Survey:    Vitals:   01/19/19 0922  BP: 133/87  Pulse: 70  Resp: 19  Temp: 98.9 F (37.2 C)  SpO2: 95%  Weight: 178 lb 14.4 oz (81.1 kg)  Height: 5\' 10"  (1.778 m)   Body mass index is 25.67 kg/m. Physical Exam Vitals and nursing note reviewed.  Constitutional:      General: He is not in acute distress.    Appearance: Normal appearance. He is not ill-appearing, toxic-appearing or diaphoretic.  HENT:     Head: Normocephalic and atraumatic.     Nose: Nose normal.     Mouth/Throat:     Mouth: Mucous membranes are moist.  Eyes:     Extraocular Movements: Extraocular movements intact.     Conjunctiva/sclera: Conjunctivae normal.     Pupils: Pupils are equal, round, and reactive to light.  Cardiovascular:     Rate and Rhythm: Normal rate and regular rhythm.     Heart sounds: Murmur present.     Comments: Psychologist, forensic.  Pulmonary:     Breath sounds: Rales present. No wheezing or rhonchi.     Comments: Bibasilar rales.  Abdominal:     General: Bowel sounds are normal. There is no distension.     Palpations: Abdomen is soft.     Tenderness: There is no abdominal tenderness. There is no right CVA tenderness, left CVA tenderness, guarding or rebound.  Musculoskeletal:     Cervical back: Normal range of motion and neck supple.     Right lower leg: Edema present.       Left lower leg: Edema present.     Comments: Trace edema BLE. Right shoulder pain with ROM, palpated acromioclavicular join, rotator cuff tender/muscles.   Skin:    General: Skin is warm and dry.  Neurological:     General: No focal deficit present.     Mental Status: He is alert. Mental status is at baseline.     Motor: No weakness.     Coordination: Coordination normal.     Gait: Gait abnormal.     Comments: Oriented to person, place.   Psychiatric:        Mood and Affect: Mood normal.        Behavior: Behavior normal.        Thought Content: Thought content normal.     Labs reviewed: Recent Labs    05/09/18 0000 09/23/18 0000  NA 138 141  K 3.6 4.1  BUN 18 18  CREATININE 0.8 0.7   Recent Labs    05/09/18 0000 09/23/18 0000  AST 18 23  ALT 15 14  ALKPHOS  --  64   Recent Labs    05/09/18 0000 09/23/18 0000  WBC 10.8 6.2  HGB 11.5* 12.7*  HCT 33* 38*  PLT 202 234   Lab Results  Component Value Date   TSH 2.03 08/27/2018   Lab Results  Component Value Date   HGBA1C 5.6 12/07/2015   Lab Results  Component Value Date   CHOL 93 03/30/2018   HDL 35 03/30/2018   LDLCALC 37 03/30/2018   TRIG 127 03/30/2018   CHOLHDL 2.6 05/05/2017    Significant Diagnostic Results in last 30 days:  No results found.  Assessment/Plan Shoulder pain, bilateral Hx of bilateral shoulder pain, left rotator cuff tear. Worsened right shoulder pain with ROM x 3 days, no recollection of injury. Will continue Tylenol, Robaxin, adding BioFreeze qid to right shoulder, obtain X-ray R shoulder 2 views to evaluate further. Observe.   Combined congestive systolic and diastolic heart failure (HCC) Compensated, continue Torsemide.   Orthostatic hypotension Controlled, continue Midodrine, Droxidopa.   Ventricular tachycardia (HCC) Heart rate is in control, continue Amiodarone.   GERD (gastroesophageal reflux disease) Sable, continue Pantoprazole.     Family/ staff  Communication: plan of care reviewed with the patient and charge nurse.   Labs/tests ordered:  X-ray 2 views of the right shoulder.   Time spend 25 minutes.

## 2019-01-19 NOTE — Assessment & Plan Note (Signed)
Hx of bilateral shoulder pain, left rotator cuff tear. Worsened right shoulder pain with ROM x 3 days, no recollection of injury. Will continue Tylenol, Robaxin, adding BioFreeze qid to right shoulder, obtain X-ray R shoulder 2 views to evaluate further. Observe.

## 2019-01-19 NOTE — Assessment & Plan Note (Signed)
Compensated, continue Torsemide 

## 2019-01-28 ENCOUNTER — Telehealth: Payer: Self-pay | Admitting: *Deleted

## 2019-01-28 NOTE — Telephone Encounter (Signed)
Request Reference Number: [id number]. [name] CAP 100MG  is approved through 02/04/2020

## 2019-01-28 NOTE — Telephone Encounter (Signed)
PA for Lauree Chandler has been submitted through [place]

## 2019-02-02 ENCOUNTER — Non-Acute Institutional Stay (SKILLED_NURSING_FACILITY): Payer: Medicare Other | Admitting: Nurse Practitioner

## 2019-02-02 ENCOUNTER — Encounter: Payer: Self-pay | Admitting: Nurse Practitioner

## 2019-02-02 DIAGNOSIS — G903 Multi-system degeneration of the autonomic nervous system: Secondary | ICD-10-CM | POA: Diagnosis not present

## 2019-02-02 DIAGNOSIS — G2581 Restless legs syndrome: Secondary | ICD-10-CM

## 2019-02-02 DIAGNOSIS — K5909 Other constipation: Secondary | ICD-10-CM

## 2019-02-02 DIAGNOSIS — M159 Polyosteoarthritis, unspecified: Secondary | ICD-10-CM

## 2019-02-02 DIAGNOSIS — F339 Major depressive disorder, recurrent, unspecified: Secondary | ICD-10-CM

## 2019-02-02 DIAGNOSIS — K219 Gastro-esophageal reflux disease without esophagitis: Secondary | ICD-10-CM | POA: Diagnosis not present

## 2019-02-02 DIAGNOSIS — I5040 Unspecified combined systolic (congestive) and diastolic (congestive) heart failure: Secondary | ICD-10-CM

## 2019-02-02 DIAGNOSIS — E032 Hypothyroidism due to medicaments and other exogenous substances: Secondary | ICD-10-CM

## 2019-02-02 DIAGNOSIS — I472 Ventricular tachycardia, unspecified: Secondary | ICD-10-CM

## 2019-02-02 DIAGNOSIS — R339 Retention of urine, unspecified: Secondary | ICD-10-CM

## 2019-02-02 NOTE — Progress Notes (Signed)
Location:    [facility] Room Number: 29 Place of Service:  SNF (31) Provider:  Markayla Reichart X, NP  [name], MD  Patient Care Team: [name], MD as PCP - General (Internal Medicine) [name], MD as PCP - Cardiology (Cardiology) [name], MD as Attending Physician (Cardiology) Vevelyn Royals, MD as Consulting Physician (Ophthalmology) Franchot Gallo, MD as Consulting Physician (Urology) Tanda Rockers, MD as Consulting Physician (Pulmonary Disease) Allyn Kenner, MD (Dermatology) Deliah Goody, PA-C as Physician Assistant (Physician Assistant) Tonnya Garbett X, NP as Nurse Practitioner (Internal Medicine) Ngetich, Nelda Bucks, NP as Nurse Practitioner (Family Medicine)  Extended Emergency Contact Information Primary Emergency Contact: Mcglynn,[name] Address: [place], [place] Phone: [phone] Mobile Phone: [phone] Relation: Spouse Secondary Emergency Contact: [name],Sylva  [place] Mobile Phone: [phone] Relation: Daughter  Code Status:  Full Code Goals of care: Advanced Directive information Advanced Directives 11/17/2018  Does Patient Have a Medical Advance Directive? Yes  Type of Paramedic of [place];Living will  Does patient want to make changes to medical advance directive? No - Patient declined  Copy of [place] in Chart? Yes - validated most recent copy scanned in chart (See row information)  Would patient like information on creating a medical advance directive? -  Pre-existing out of facility DNR order (yellow form or pink MOST form) -     Chief Complaint  Patient presents with  . Medical Management of Chronic Issues    HPI:  Pt is a 85 y.o. male seen today for medical management of chronic diseases.    The patient resides in [facility] for safety, care assistance, w/c for mobility.  Hx of  CHF/chronic edema BLE, on Torsemide 10mg  qd. Constipation, stable, on Trulance 3mg  qd, Senokot S II bid. RLS, managed on Requip 0.5mg  bid, and daily prn. Orthostatic hypotension, stable, on Midodrine 5mg  tid,  Droxidopa 100mg  tid. GERD, stable, on Protonix 40mg  qd. Osteoarthritis, in general, shoulders, knees, lower back pain, stable, on Tylenol 500mg  tid,  Robaxin 250mg  bid. Hypothyroidism, stable, on Levothyroxine 153mcg qd. His mood is stable, on Duloxetine 20mg  qd. V-tach, heart rate is controlled, on Amiodarone 100mg  qd.    Past Medical History:  Diagnosis Date  . Arthritis    "minor, back and sometimes knees" (12/15/2012)  . Bradycardia    AFib/SSS s/p St [employer] PPM 04/12/2008  . CAD (coronary artery disease) 12/30/2014   CABG (LIMA-LAD, SVG-RCA, SVG-OM in 1996).  07/2009 BMS to SVG-RCA. Cath in 04/2010 with patent stents   . Cardiomyopathy, ischemic 08/25/2012  . CHF (congestive heart failure) ([place])   . Chronic knee pain 12/03/2014  . Combined congestive systolic and diastolic heart failure ([place]) 02/02/2015   Hx EF 41%. BNP 96.8 02/21/15 Torsemide 04/06/15 Na 142, K 4.6, Bun 16, creat 0.89 04/20/14 BNP 111.7, Na 142, K 4.6, Bun 16, creat 0.9   . Depression with anxiety 02/02/2015   02/21/15 Hgb A1c 5.8 03/10/15 MMSE 30/30   . Dizziness, after diuretic asscoiated with hypotension and responded to fluid bolus 06/05/2011   04/28/15 US carotid R+L normal bilateral arterial velocities.    Marland Kitchen Dyspnea 08/11/2014   Followed in Pulmonary clinic/ [facility]/ [place]  - 08/11/2014  Walked RA x 1 laps @ 185 ft each stopped due to fatigue/off balance/ slow pace/  no sob or desat  -  PFT's  09/26/2014  FEV1 2.26 (85 % ) ratio 76  p no % improvement from saba with DLCO  67 % corrects to 93 % for alv volume      Since prev study 08/04/13 minimal change lung vol or dlco    . Embolic cerebral infarction ([place]) 12/06/2015  . Exertional shortness of breath    "sometimes walking" (12/15/2012)  . GERD (gastroesophageal reflux  disease)   . Gout 02/09/2015  . Heart murmur    "just told I had one today" (12/15/2012)  . Hiatal hernia   . Hyperlipidemia   . Hypertension   . Hypothyroid   . Influenza A 03/10/2016  . Insomnia   . Melanoma of back ([place]) 1976  . Myocardial infarction [facility]) 1996; 2011   "both silent" (12/15/2012)  . Nonrheumatic aortic valve stenosis   . Orthostatic hypotension   . Osteoporosis, senile   . Pacemaker   . RBBB   . Restless leg 02/02/2015  . Right leg weakness 12/06/2015  . [facility] spotted fever   . S/P CABG x 4   . Sick sinus syndrome ([place]) 01/31/2014  . Sustained ventricular tachycardia ([place]) 07/27/2014  . Urinary retention 12/30/2014   has a cathrater   Past Surgical History:  Procedure Laterality Date  . CARDIAC CATHETERIZATION  04/2010   LIMA to LAD patent,SVG to OM patent,no in-stnet restenosis RCA  . CATARACT EXTRACTION W/ INTRAOCULAR LENS  IMPLANT, BILATERAL Bilateral 2012  . CORONARY ANGIOPLASTY WITH STENT PLACEMENT  07/2009   bare metal stent to SVG to the RCA  . CORONARY ARTERY BYPASS GRAFT  1996   LIMA to LAD,SVG to RCA & SVG to OM  . INSERT / REPLACE / REMOVE PACEMAKER  2010  . MELANOMA EXCISION  05/1974 X2   "taken off my back" (12/15/2012)  . NM MYOVIEW LTD  06/2011   low risk  . PPM GENERATOR CHANGEOUT N/A 01/22/2017   Procedure: PPM GENERATOR CHANGEOUT;  Surgeon: [name], MD;  Location: [facility] CV LAB;  Service: Cardiovascular;  Laterality: N/A;  . TONSILLECTOMY  1938  . TRANSURETHRAL RESECTION OF PROSTATE  1986  . US ECHOCARDIOGRAPHY  07/11/2009   EF 45-50%    Allergies  Allergen Reactions  . Altace [Ramipril] Cough  . Crestor [Rosuvastatin Calcium] Rash  . Penicillins Rash and Other (See Comments)    Has patient had a PCN reaction causing immediate rash, facial/tongue/throat swelling, SOB or lightheadedness with hypotension: Yes Has patient had a PCN reaction causing severe rash involving mucus membranes or skin necrosis: No Has  patient had a PCN reaction that required hospitalization No Has patient had a PCN reaction occurring within the last 10 years: No If all of the above answers are "NO", then may proceed with Cephalosporin use.   . Sulfa Antibiotics Rash  . Sulfamethoxazole-Trimethoprim Rash    Allergies as of 02/02/2019      Reactions   Altace [ramipril] Cough   Crestor [rosuvastatin Calcium] Rash   Penicillins Rash, Other (See Comments)   Has patient had a PCN reaction causing immediate rash, facial/tongue/throat swelling, SOB or lightheadedness with hypotension: Yes Has patient had a PCN reaction causing severe rash involving mucus membranes or skin necrosis: No Has patient had a PCN reaction that required hospitalization No Has patient had a PCN reaction occurring within the last 10 years: No If all of the above answers are "NO", then may proceed with Cephalosporin use.   Sulfa Antibiotics Rash   Sulfamethoxazole-trimethoprim Rash  Medication List       Accurate as of February 02, 2019 11:59 PM. If you have any questions, ask your nurse or doctor.        acetaminophen 500 MG tablet Commonly known as: TYLENOL Take 500 mg by mouth 3 (three) times daily. What changed: Another medication with the same name was removed. Continue taking this medication, and follow the directions you see here. Changed by: Aayat Hajjar X Jenavieve Freda, NP   amiodarone 100 MG tablet Commonly known as: PACERONE Take 1 tablet (100 mg total) by mouth daily.   aspirin EC 81 MG tablet Take 1 tablet (81 mg total) by mouth daily.   Biofreeze 4 % Gel Generic drug: Menthol (Topical Analgesic) Apply topically. Four Times A Day   clotrimazole-betamethasone cream Commonly known as: LOTRISONE Apply 1 application topically daily as needed.   DULoxetine 20 MG capsule Commonly known as: CYMBALTA Take 20 mg by mouth daily. For anxiety,insomnia and back pain   feeding supplement (BOOST BREEZE) Liqd Take by mouth 2 (two) times  daily.   feeding supplement (PRO-STAT SUGAR FREE 64) Liqd Take 30 mLs by mouth 2 (two) times a day.   fluticasone 0.05 % cream Commonly known as: CUTIVATE Apply topically 2 (two) times daily as needed. Apply small amount on  face and back also can apply as needed   fluticasone 50 MCG/ACT nasal spray Commonly known as: FLONASE Place 1 spray into both nostrils 2 (two) times daily.   guaiFENesin 100 MG/5ML Soln Commonly known as: ROBITUSSIN Take 5 mLs (100 mg total) by mouth every 4 (four) hours as needed for cough or to loosen phlegm.   ketoconazole 2 % cream Commonly known as: NIZORAL Apply 1 application topically as needed for irritation. Apply small amount behind ears and in creases beside nose BID x 7 days. MAY REPEAT PRN. (Family supplied).   levothyroxine 100 MCG tablet Commonly known as: SYNTHROID Take 100 mcg by mouth daily before breakfast.   meclizine 25 MG tablet Commonly known as: ANTIVERT Take 1 tablet (25 mg total) by mouth 2 (two) times daily as needed for dizziness.   Melatonin 3 MG Tabs Take 3 mg by mouth at bedtime.   methocarbamol 500 MG tablet Commonly known as: ROBAXIN Take 250 mg by mouth 2 (two) times daily.   midodrine 5 MG tablet Commonly known as: PROAMATINE Take 5 mg by mouth 3 (three) times daily with meals. Notify provider if BP is < 90/60.   [name] 100 MG Caps Generic drug: Droxidopa TAKE 1 CAPSULE BY MOUTH THREE TIMES DAILY. TAKE LAST DOSE AT LEAST 3 HOURS BEFORE BEDTIME.   ORAJEL DRY MOUTH MT Use as directed in the mouth or throat. Swish and spit 10 ml 4 x day as needed for sore gums. Continue until dentures fit and/or next dental visit. Four Times A Day - PRN   pantoprazole 40 MG tablet Commonly known as: PROTONIX Take 40 mg by mouth daily.   Plecanatide 3 MG Tabs Commonly known as: Trulance Take 3 mg by mouth daily.   polyethylene glycol 17 g packet Commonly known as: MIRALAX / GLYCOLAX Take 17 g by mouth daily as needed.  Take 1-2 capfuls in 8 oz of H2O   pravastatin 40 MG tablet Commonly known as: PRAVACHOL Take 1 tablet (40 mg total) by mouth at bedtime.   rOPINIRole 0.25 MG tablet Commonly known as: REQUIP Take 0.5 mg by mouth 2 (two) times daily.   rOPINIRole 0.5 MG tablet Commonly known as: REQUIP  Take 0.5 mg by mouth daily as needed.   sennosides-docusate sodium 8.6-50 MG tablet Commonly known as: SENOKOT-S Take 2 tablets by mouth 2 (two) times daily.   torsemide 10 MG tablet Commonly known as: DEMADEX Take 1 tablet (10 mg total) by mouth daily.   VARICELLA-ZOSTER IMMUNE GLOB IM Inject into the muscle. suspension for reconstitution; 50 mcg/0.5 mL; amt: 0.5 mL; intramuscular  Special Instructions: Shingrix 0.5 mL IM x 1 then repeat 2 - 6 months. Once - One Time Start taking on: February 17, 2019   Vitamin D3 125 MCG (5000 UT) Caps Take 5,000 Units by mouth every Monday.   zinc oxide 20 % ointment 1 application. Apply to buttocks after each incontinent episode and as needed for redness. May keep at bedside. As Needed      ROS was provided with assistance of staff.  Review of Systems  Constitutional: Negative for activity change, appetite change, chills, diaphoresis, fatigue, fever and unexpected weight change.  HENT: Positive for hearing loss. Negative for congestion and voice change.   Eyes: Negative for visual disturbance.  Respiratory: Negative for cough, shortness of breath and wheezing.   Cardiovascular: Positive for leg swelling. Negative for chest pain and palpitations.  Gastrointestinal: Negative for abdominal distention, abdominal pain, constipation, diarrhea, nausea and vomiting.  Genitourinary: Positive for difficulty urinating. Negative for dysuria and hematuria.       Foley  Musculoskeletal: Positive for arthralgias and gait problem.       Right shoulder limited over head ROM  Skin: Negative for color change and pallor.  Neurological: Negative for dizziness, speech  difficulty, weakness and headaches.       Memory lapses.   Psychiatric/Behavioral: Negative for agitation, behavioral problems, hallucinations and sleep disturbance. The patient is not nervous/anxious.     Immunization History  Administered Date(s) Administered  . Influenza, High Dose Seasonal PF 11/01/2014, 11/13/2016, 11/19/2018  . Influenza,inj,Quad PF,6+ Mos 11/24/2017  . Influenza,inj,quad, With Preservative 11/17/2017  . Influenza-Unspecified 12/05/2013, 11/05/2014, 11/16/2015  . PPD Test 03/07/2014  . Pneumococcal Conjugate-13 10/05/2009  . Pneumococcal Polysaccharide-23 11/05/2005  . Pneumococcal-Unspecified 10/05/2009  . Tdap 08/02/2014  . Zoster 11/05/2005   Pertinent  Health Maintenance Due  Topic Date Due  . DEXA SCAN  12/14/2024  . INFLUENZA VACCINE  Completed  . PNA vac Low Risk Adult  Completed   Fall Risk  10/09/2017 10/02/2016 10/30/2015 09/05/2015 02/02/2015  Falls in the past year? No No No No No  Risk for fall due to : - - Impaired balance/gait - -   Functional Status Survey:    Vitals:   02/02/19 1021  BP: 130/71  Pulse: 70  Resp: 20  Temp: (!) 97.5 F (36.4 C)  SpO2: 95%  Weight: 180 lb 3.2 oz (81.7 kg)  Height: 5\' 10"  (1.778 m)   Body mass index is 25.86 kg/m. Physical Exam Vitals and nursing note reviewed.  Constitutional:      General: He is not in acute distress.    Appearance: Normal appearance. He is not ill-appearing, toxic-appearing or diaphoretic.  HENT:     Head: Normocephalic and atraumatic.     Nose: Nose normal.     Mouth/Throat:     Mouth: Mucous membranes are moist.  Eyes:     Extraocular Movements: Extraocular movements intact.     Conjunctiva/sclera: Conjunctivae normal.     Pupils: Pupils are equal, round, and reactive to light.  Cardiovascular:     Rate and Rhythm: Normal rate and regular rhythm.  Heart sounds: Murmur present.     Comments: Left upper chest pace maker.  Pulmonary:     Breath sounds: No wheezing,  rhonchi or rales.  Abdominal:     General: Bowel sounds are normal. There is no distension.     Palpations: Abdomen is soft.     Tenderness: There is no abdominal tenderness. There is no right CVA tenderness, left CVA tenderness, guarding or rebound.  Genitourinary:    Comments: Foley.  Musculoskeletal:     Cervical back: Normal range of motion and neck supple.     Right lower leg: Edema present.     Left lower leg: Edema present.     Comments: Trace edema BLE. Reduced over head ROM of the right shoulder.   Skin:    General: Skin is warm and dry.  Neurological:     General: No focal deficit present.     Mental Status: He is alert. Mental status is at baseline.     Motor: No weakness.     Coordination: Coordination normal.     Gait: Gait abnormal.     Comments: Oriented to person, place.   Psychiatric:        Mood and Affect: Mood normal.        Behavior: Behavior normal.        Thought Content: Thought content normal.     Labs reviewed: Recent Labs    05/09/18 0000 09/23/18 0000  NA 138 141  K 3.6 4.1  BUN 18 18  CREATININE 0.8 0.7   Recent Labs    05/09/18 0000 09/23/18 0000  AST 18 23  ALT 15 14  ALKPHOS  --  64   Recent Labs    05/09/18 0000 09/23/18 0000  WBC 10.8 6.2  HGB 11.5* 12.7*  HCT 33* 38*  PLT 202 234   Lab Results  Component Value Date   TSH 2.03 08/27/2018   Lab Results  Component Value Date   HGBA1C 5.6 12/07/2015   Lab Results  Component Value Date   CHOL 93 03/30/2018   HDL 35 03/30/2018   LDLCALC 37 03/30/2018   TRIG 127 03/30/2018   CHOLHDL 2.6 05/05/2017    Significant Diagnostic Results in last 30 days:  No results found.  Assessment/Plan Chronic constipation Stable, continue Trulance 3mg  qd, Senokot S II bid, prn MiraLax.   Combined congestive systolic and diastolic heart failure (HCC) Compensated clinically, chronic trace edema BLE, continue Torsemide 10mg  qd.   Neurogenic orthostatic hypotension  (HCC) Stabilized, continue Midodrine 5mg  tid, Droxidopa 100mg  tid.   GERD (gastroesophageal reflux disease) Stable, continue Protonix.   Hypothyroidism Stable, TSH wnl 08/2018, continue Levothyroxine 116mcg qd.   Osteoarthritis Multiples sites, continue Tylneol, Robaxin.   Ventricular tachycardia (HCC) Heart rate is in control, continue Amiodarone.   Urinary retention Continue Foley.   Depression, recurrent ([place]) His mood is stable, continue Duloxetine.   RLS (restless legs syndrome) Stable, continue Requip bid, prn.      Family/ staff Communication: plan of care reviewed with the patient and charge nurse.   Labs/tests ordered:  none  Time spend 25 minutes.

## 2019-02-04 ENCOUNTER — Encounter: Payer: Self-pay | Admitting: Nurse Practitioner

## 2019-02-04 NOTE — Assessment & Plan Note (Signed)
Stable, continue Protonix 

## 2019-02-04 NOTE — Assessment & Plan Note (Signed)
Multiples sites, continue Tylneol, Robaxin.

## 2019-02-04 NOTE — Assessment & Plan Note (Signed)
Heart rate is in control, continue Amiodarone.  

## 2019-02-04 NOTE — Assessment & Plan Note (Signed)
Stable, TSH wnl 08/2018, continue Levothyroxine 163mcg qd.

## 2019-02-04 NOTE — Assessment & Plan Note (Signed)
Continue Foley °

## 2019-02-04 NOTE — Assessment & Plan Note (Signed)
Stable, continue Requip bid, prn.

## 2019-02-04 NOTE — Assessment & Plan Note (Signed)
Compensated clinically, chronic trace edema BLE, continue Torsemide 10mg  qd.

## 2019-02-04 NOTE — Assessment & Plan Note (Signed)
Stabilized, continue Midodrine 5mg  tid, Droxidopa 100mg  tid.

## 2019-02-04 NOTE — Assessment & Plan Note (Signed)
His mood is stable, continue Duloxetine.  

## 2019-02-04 NOTE — Assessment & Plan Note (Signed)
Stable, continue Trulance 3mg qd, Senokot S II bid, prn MiraLax.  

## 2019-02-09 ENCOUNTER — Ambulatory Visit (INDEPENDENT_AMBULATORY_CARE_PROVIDER_SITE_OTHER): Payer: Medicare PPO | Admitting: *Deleted

## 2019-02-09 DIAGNOSIS — I495 Sick sinus syndrome: Secondary | ICD-10-CM

## 2019-02-10 LAB — CUP PACEART REMOTE DEVICE CHECK
Battery Remaining Longevity: 113 mo
Battery Remaining Percentage: 95.5 %
Battery Voltage: 2.98 V
Brady Statistic AP VP Percent: 24 %
Brady Statistic AP VS Percent: 76 %
Brady Statistic AS VP Percent: 1 %
Brady Statistic AS VS Percent: 1 %
Brady Statistic RA Percent Paced: 98 %
Brady Statistic RV Percent Paced: 24 %
Date Time Interrogation Session: 20210105145449
Implantable Lead Implant Date: 20100309
Implantable Lead Implant Date: 20100309
Implantable Lead Location: 753859
Implantable Lead Location: 753860
Implantable Pulse Generator Implant Date: 20181219
Lead Channel Impedance Value: 410 Ohm
Lead Channel Impedance Value: 490 Ohm
Lead Channel Pacing Threshold Amplitude: 0.25 V
Lead Channel Pacing Threshold Amplitude: 0.625 V
Lead Channel Pacing Threshold Pulse Width: 0.4 ms
Lead Channel Pacing Threshold Pulse Width: 0.4 ms
Lead Channel Sensing Intrinsic Amplitude: 11.2 mV
Lead Channel Sensing Intrinsic Amplitude: 2.4 mV
Lead Channel Setting Pacing Amplitude: 0.875
Lead Channel Setting Pacing Amplitude: 2.5 V
Lead Channel Setting Pacing Pulse Width: 0.4 ms
Lead Channel Setting Sensing Sensitivity: 2 mV
Pulse Gen Model: 2272
Pulse Gen Serial Number: 8965776

## 2019-02-11 ENCOUNTER — Telehealth: Payer: Self-pay

## 2019-02-11 NOTE — Telephone Encounter (Signed)
Patient's daughter called stated that the pt has felt lethargic and wants to know if they can go up on the northera dose. 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 they were giving out covid vaccines this week and could possibly feel under the weather due to that but I will route to the pharmd pool to further asses and call back the pt.

## 2019-02-11 NOTE — Telephone Encounter (Signed)
Got COVID vaccine on Tuesday.  No BP readying available but SBP ~110 to 130 per daughter recollection.   No changes next time. Daughter will call back if not changes noted by next week.

## 2019-02-23 ENCOUNTER — Telehealth: Payer: Self-pay

## 2019-02-23 ENCOUNTER — Telehealth: Payer: Self-pay | Admitting: Cardiovascular Disease

## 2019-02-23 NOTE — Telephone Encounter (Signed)
Duplicate encounter. closed

## 2019-02-23 NOTE — Telephone Encounter (Signed)
Spoke with wife and patient has new plan with [employer] this year. Florinef and Midodrine are the preferred medications. Per wife patient has tried in the past but has not done as well as with the [name]. Wife will call back with patients member number, group number, and [employer] number to call. Will forward to [place] with Dr Sallyanne Kuster

## 2019-02-23 NOTE — Telephone Encounter (Signed)
Pt's wife called in with a concern for her husband's medication [name] 100mg . She states that they received a letter in the mail from [facility] requesting that an alternative medication be sent in that they will cover; or that they have a prior authorization sent in for this medication. Pt's wife would like a call back at [phone].

## 2019-02-23 NOTE — Telephone Encounter (Signed)
New Message:   Pt insurance wants pt to switch from [place]. Pt wants to stay omn this, he is doing so well with it.

## 2019-03-08 ENCOUNTER — Non-Acute Institutional Stay (SKILLED_NURSING_FACILITY): Payer: Medicare PPO | Admitting: Internal Medicine

## 2019-03-08 ENCOUNTER — Encounter: Payer: Self-pay | Admitting: Internal Medicine

## 2019-03-08 DIAGNOSIS — K5909 Other constipation: Secondary | ICD-10-CM

## 2019-03-08 DIAGNOSIS — E032 Hypothyroidism due to medicaments and other exogenous substances: Secondary | ICD-10-CM

## 2019-03-08 DIAGNOSIS — G903 Multi-system degeneration of the autonomic nervous system: Secondary | ICD-10-CM | POA: Diagnosis not present

## 2019-03-08 DIAGNOSIS — M545 Low back pain, unspecified: Secondary | ICD-10-CM

## 2019-03-08 DIAGNOSIS — G8929 Other chronic pain: Secondary | ICD-10-CM

## 2019-03-08 DIAGNOSIS — I5040 Unspecified combined systolic (congestive) and diastolic (congestive) heart failure: Secondary | ICD-10-CM

## 2019-03-08 NOTE — Progress Notes (Signed)
Location:   [facility] Room Number: 29 Place of Service:  SNF [phone]) Provider: [name], MD  [name], MD  Patient Care Team: [name], MD as PCP - General (Internal Medicine) [name], MD as PCP - Cardiology (Cardiology) [name], MD as Attending Physician (Cardiology) Vevelyn Royals, MD as Consulting Physician (Ophthalmology) Franchot Gallo, MD as Consulting Physician (Urology) Tanda Rockers, MD as Consulting Physician (Pulmonary Disease) Allyn Kenner, MD (Dermatology) Deliah Goody, PA-C as Physician Assistant (Physician Assistant) Mast, Man X, NP as Nurse Practitioner (Internal Medicine) Ngetich, Nelda Bucks, NP as Nurse Practitioner (Family Medicine)  Extended Emergency Contact Information Primary Emergency Contact: Beil,[name] Address: [place], [place] Phone: [phone] Mobile Phone: [phone] Relation: Spouse Secondary Emergency Contact: [name],[place]  [place] Mobile Phone: [phone] Relation: Daughter  Code Status:  Full Code Goals of care: Advanced Directive information Advanced Directives 03/08/2019  Does Patient Have a Medical Advance Directive? Yes  Type of Advance Directive Living will;Healthcare Power of Attorney  Does patient want to make changes to medical advance directive? No - Patient declined  Copy of [place] in Chart? Yes - validated most recent copy scanned in chart (See row information)  Would patient like information on creating a medical advance directive? -  Pre-existing out of facility DNR order (yellow form or pink MOST form) -     Chief Complaint  Patient presents with  . Medical Management of Chronic Issues    HPI:  Pt is a 86 y.o. male seen today for medical management of chronic diseases.    He has h/o CAD s/p Bypass surgery with Extensive Inferior Wall Scarring, Ischemic  Cardiomyopathy withsystolic heart failure EF of 35-40% Moderate AS,history of ventricular tachycardia stable on amiodarone, sinus node dysfunction with dual-chamber PPP, severe orthostatic hypotension neurogenic,history of compression fractures, history of ischemic stroke in the past on dual therapy.,History of chronic indwelling Foley catheter  His Complains today LE edema Some Worsening Weight is stable C/O SOB with Exertion but not worse recently Lower Back pain Worse recently. No Falls recently. No Radiation to legs. Mostly worse with change in position Dizziness Combination of his Vertigo and orthostatic  Constipation Stable on number of Meds  His weight is stable Not working with therapy right now Gets around in his wheelchair   Past Medical History:  Diagnosis Date  . Arthritis    "minor, back and sometimes knees" (12/15/2012)  . Bradycardia    AFib/SSS s/p St Jude PPM 04/12/2008  . CAD (coronary artery disease) 12/30/2014   CABG (LIMA-LAD, SVG-RCA, SVG-OM in 1996).  07/2009 BMS to SVG-RCA. Cath in 04/2010 with patent stents   . Cardiomyopathy, ischemic 08/25/2012  . CHF (congestive heart failure) ([place])   . Chronic knee pain 12/03/2014  . Combined congestive systolic and diastolic heart failure ([place]) 02/02/2015   Hx EF 41%. BNP 96.8 02/21/15 Torsemide 04/06/15 Na 142, K 4.6, Bun 16, creat 0.89 04/20/14 BNP 111.7, Na 142, K 4.6, Bun 16, creat 0.9   . Depression with anxiety 02/02/2015   02/21/15 Hgb A1c 5.8 03/10/15 MMSE 30/30   . Dizziness, after diuretic asscoiated with hypotension and responded to fluid bolus 06/05/2011   04/28/15 US carotid R+L normal bilateral arterial velocities.    Marland Kitchen Dyspnea 08/11/2014   Followed in Pulmonary clinic/ [facility]/ [facility]  - 08/11/2014  Walked RA x  1 laps @ 185 ft each stopped due to fatigue/off balance/ slow pace/  no sob or desat  - PFT's  09/26/2014  FEV1 2.26 (85 % ) ratio 76  p no % improvement from saba with DLCO  67 % corrects to 93 %  for alv volume      Since prev study 08/04/13 minimal change lung vol or dlco    . Embolic cerebral infarction ([place]) 12/06/2015  . Exertional shortness of breath    "sometimes walking" (12/15/2012)  . GERD (gastroesophageal reflux disease)   . Gout 02/09/2015  . Heart murmur    "just told I had one today" (12/15/2012)  . Hiatal hernia   . Hyperlipidemia   . Hypertension   . Hypothyroid   . Influenza A 03/10/2016  . Insomnia   . Melanoma of back ([place]) 1976  . Myocardial infarction [facility]) 1996; 2011   "both silent" (12/15/2012)  . Nonrheumatic aortic valve stenosis   . Orthostatic hypotension   . Osteoporosis, senile   . Pacemaker   . RBBB   . Restless leg 02/02/2015  . Right leg weakness 12/06/2015  . [place] spotted fever   . S/P CABG x 4   . Sick sinus syndrome ([place]) 01/31/2014  . Sustained ventricular tachycardia ([place]) 07/27/2014  . Urinary retention 12/30/2014   has a cathrater   Past Surgical History:  Procedure Laterality Date  . CARDIAC CATHETERIZATION  04/2010   LIMA to LAD patent,SVG to OM patent,no in-stnet restenosis RCA  . CATARACT EXTRACTION W/ INTRAOCULAR LENS  IMPLANT, BILATERAL Bilateral 2012  . CORONARY ANGIOPLASTY WITH STENT PLACEMENT  07/2009   bare metal stent to SVG to the RCA  . CORONARY ARTERY BYPASS GRAFT  1996   LIMA to LAD,SVG to RCA & SVG to OM  . INSERT / REPLACE / REMOVE PACEMAKER  2010  . MELANOMA EXCISION  05/1974 X2   "taken off my back" (12/15/2012)  . NM MYOVIEW LTD  06/2011   low risk  . PPM GENERATOR CHANGEOUT N/A 01/22/2017   Procedure: PPM GENERATOR CHANGEOUT;  Surgeon: [name], MD;  Location: [facility];  Service: Cardiovascular;  Laterality: N/A;  . TONSILLECTOMY  1938  . TRANSURETHRAL RESECTION OF PROSTATE  1986  . US ECHOCARDIOGRAPHY  07/11/2009   EF 45-50%    Allergies  Allergen Reactions  . Altace [Ramipril] Cough  . Crestor [Rosuvastatin Calcium] Rash  . Penicillins Rash and Other (See Comments)    Has  patient had a PCN reaction causing immediate rash, facial/tongue/throat swelling, SOB or lightheadedness with hypotension: Yes Has patient had a PCN reaction causing severe rash involving mucus membranes or skin necrosis: No Has patient had a PCN reaction that required hospitalization No Has patient had a PCN reaction occurring within the last 10 years: No If all of the above answers are "NO", then may proceed with Cephalosporin use.   . Sulfa Antibiotics Rash  . Sulfamethoxazole-Trimethoprim Rash    Allergies as of 03/08/2019      Reactions   Altace [ramipril] Cough   Crestor [rosuvastatin Calcium] Rash   Penicillins Rash, Other (See Comments)   Has patient had a PCN reaction causing immediate rash, facial/tongue/throat swelling, SOB or lightheadedness with hypotension: Yes Has patient had a PCN reaction causing severe rash involving mucus membranes or skin necrosis: No Has patient had a PCN reaction that required hospitalization No Has patient had a PCN reaction occurring within the last 10 years: No If all of the above  answers are "NO", then may proceed with Cephalosporin use.   Sulfa Antibiotics Rash   Sulfamethoxazole-trimethoprim Rash      Medication List       Accurate as of March 08, 2019  9:58 AM. If you have any questions, ask your nurse or doctor.        acetaminophen 500 MG tablet Commonly known as: TYLENOL Take 500 mg by mouth 3 (three) times daily.   amiodarone 100 MG tablet Commonly known as: PACERONE Take 1 tablet (100 mg total) by mouth daily.   aspirin EC 81 MG tablet Take 1 tablet (81 mg total) by mouth daily.   Biofreeze 4 % Gel Generic drug: Menthol (Topical Analgesic) Apply topically. Four Times A Day   clotrimazole-betamethasone cream Commonly known as: LOTRISONE Apply 1 application topically daily as needed.   DULoxetine 20 MG capsule Commonly known as: CYMBALTA Take 20 mg by mouth daily. For anxiety,insomnia and back pain   feeding  supplement (BOOST BREEZE) Liqd Take by mouth 2 (two) times daily.   feeding supplement (PRO-STAT SUGAR FREE 64) Liqd Take 30 mLs by mouth 2 (two) times a day.   fluticasone 0.05 % cream Commonly known as: CUTIVATE Apply topically 2 (two) times daily as needed. Apply small amount on  face and back also can apply as needed   fluticasone 50 MCG/ACT nasal spray Commonly known as: FLONASE Place 1 spray into both nostrils 2 (two) times daily.   guaiFENesin 100 MG/5ML Soln Commonly known as: ROBITUSSIN Take 5 mLs (100 mg total) by mouth every 4 (four) hours as needed for cough or to loosen phlegm.   ketoconazole 2 % cream Commonly known as: NIZORAL Apply 1 application topically as needed for irritation. Apply small amount behind ears and in creases beside nose BID x 7 days. MAY REPEAT PRN. (Family supplied).   levothyroxine 100 MCG tablet Commonly known as: SYNTHROID Take 100 mcg by mouth daily before breakfast.   meclizine 25 MG tablet Commonly known as: ANTIVERT Take 1 tablet (25 mg total) by mouth 2 (two) times daily as needed for dizziness.   Melatonin 3 MG Tabs Take 3 mg by mouth at bedtime.   methocarbamol 500 MG tablet Commonly known as: ROBAXIN Take 250 mg by mouth 2 (two) times daily. As needed   midodrine 5 MG tablet Commonly known as: PROAMATINE Take 5 mg by mouth 3 (three) times daily with meals. Notify provider if BP is < 90/60.   [name] 100 MG Caps Generic drug: Droxidopa TAKE 1 CAPSULE BY MOUTH THREE TIMES DAILY. TAKE LAST DOSE AT LEAST 3 HOURS BEFORE BEDTIME.   ORAJEL DRY MOUTH MT Use as directed in the mouth or throat. Swish and spit 10 ml 4 x day as needed for sore gums. Continue until dentures fit and/or next dental visit. Four Times A Day - PRN   pantoprazole 40 MG tablet Commonly known as: PROTONIX Take 40 mg by mouth daily.   Plecanatide 3 MG Tabs Commonly known as: Trulance Take 3 mg by mouth daily.   polyethylene glycol 17 g packet Commonly  known as: MIRALAX / GLYCOLAX Take 17 g by mouth daily as needed. Take 1-2 capfuls in 8 oz of H2O   pravastatin 40 MG tablet Commonly known as: PRAVACHOL Take 1 tablet (40 mg total) by mouth at bedtime.   rOPINIRole 0.25 MG tablet Commonly known as: REQUIP Take 0.5 mg by mouth 2 (two) times daily.   rOPINIRole 0.5 MG tablet Commonly known as: REQUIP Take  0.5 mg by mouth daily as needed.   sennosides-docusate sodium 8.6-50 MG tablet Commonly known as: SENOKOT-S Take 2 tablets by mouth 2 (two) times daily.   torsemide 10 MG tablet Commonly known as: DEMADEX Take 1 tablet (10 mg total) by mouth daily.   Vitamin D3 125 MCG (5000 UT) Caps Take 5,000 Units by mouth every Monday.   zinc oxide 20 % ointment 1 application. Apply to buttocks after each incontinent episode and as needed for redness. May keep at bedside. As Needed       Review of Systems  Constitutional: Negative.   HENT: Negative.   Respiratory: Positive for shortness of breath.   Cardiovascular: Positive for leg swelling.  Gastrointestinal: Positive for constipation.  Genitourinary: Negative.   Musculoskeletal: Positive for back pain and gait problem.  Skin: Negative.   Neurological: Positive for weakness.  Psychiatric/Behavioral: Negative.     Immunization History  Administered Date(s) Administered  . Influenza, High Dose Seasonal PF 11/01/2014, 11/13/2016, 11/19/2018  . Influenza,inj,Quad PF,6+ Mos 11/24/2017  . Influenza,inj,quad, With Preservative 11/17/2017  . Influenza-Unspecified 12/05/2013, 11/05/2014, 11/16/2015  . PPD Test 03/07/2014  . Pneumococcal Conjugate-13 10/05/2009  . Pneumococcal Polysaccharide-23 11/05/2005  . Pneumococcal-Unspecified 10/05/2009  . Tdap 08/02/2014  . Zoster 11/05/2005   Pertinent  Health Maintenance Due  Topic Date Due  . DEXA SCAN  12/14/2024  . INFLUENZA VACCINE  Completed  . PNA vac Low Risk Adult  Completed   Fall Risk  10/09/2017 10/02/2016 10/30/2015  09/05/2015 02/02/2015  Falls in the past year? No No No No No  Risk for fall due to : - - Impaired balance/gait - -   Functional Status Survey:    Vitals:   03/08/19 0934  BP: 127/76  Pulse: 68  Resp: 20  Temp: (!) 96.1 F (35.6 C)  SpO2: 92%  Weight: 179 lb 6.4 oz (81.4 kg)  Height: 5\' 10"  (1.778 m)   Body mass index is 25.74 kg/m. Physical Exam Vitals reviewed.  Constitutional:      Appearance: Normal appearance.  HENT:     Head: Normocephalic.     Nose: Nose normal.     Mouth/Throat:     Mouth: Mucous membranes are moist.     Pharynx: Oropharynx is clear.  Eyes:     Pupils: Pupils are equal, round, and reactive to light.  Cardiovascular:     Rate and Rhythm: Normal rate.     Heart sounds: Murmur present.  Pulmonary:     Effort: Pulmonary effort is normal. No respiratory distress.     Breath sounds: Normal breath sounds. No rales.  Abdominal:     General: Abdomen is flat. Bowel sounds are normal.     Palpations: Abdomen is soft.  Musculoskeletal:     Cervical back: Neck supple.     Comments: Moderate Edema  Skin:    General: Skin is warm.  Neurological:     General: No focal deficit present.     Mental Status: He is alert and oriented to person, place, and time.  Psychiatric:        Mood and Affect: Mood normal.        Thought Content: Thought content normal.     Labs reviewed: Recent Labs    05/09/18 0000 09/23/18 0000  NA 138 141  K 3.6 4.1  BUN 18 18  CREATININE 0.8 0.7   Recent Labs    05/09/18 0000 09/23/18 0000  AST 18 23  ALT 15 14  ALKPHOS  --  64   Recent Labs    05/09/18 0000 09/23/18 0000  WBC 10.8 6.2  HGB 11.5* 12.7*  HCT 33* 38*  PLT 202 234   Lab Results  Component Value Date   TSH 2.03 08/27/2018   Lab Results  Component Value Date   HGBA1C 5.6 12/07/2015   Lab Results  Component Value Date   CHOL 93 03/30/2018   HDL 35 03/30/2018   LDLCALC 37 03/30/2018   TRIG 127 03/30/2018   CHOLHDL 2.6 05/05/2017     Significant Diagnostic Results in last 30 days:  CUP PACEART REMOTE DEVICE CHECK  Result Date: 02/10/2019 Scheduled remote reviewed.  Normal device function.  Next remote 91 days.   Assessment/Plan  Chronic left-sided low back pain without sciatica Had CT scan in 2019 which Showed Mild Spinal stenosis and Compression Fracture of L3 Is on Tylenol and Robaxin. Will Make it robaxin every day for 2 weeks No Recent Falls Will get Xray of Lower Spine to rule out any New Fractures  Chronic constipation On Trulance and Senokot.Symptoms seemd to be controlled  Combined systolic and diastolic congestive heart failure, unspecified HF chronicity (HCC) Some swelling in his Legs Will increase his Demadex to 20 mg  Repeat BMP in 2 weeks  Neurogenic orthostatic hypotension (HCC) Symptoms controlled on Midodrine and [name] Hypothyroidism due to medication Repeat TSH  CAD Doing well on aspirin and Statin Last LDL was 37 S/P PPP On Low dose of Amiodarone for H/o VT Follows with Cardiology Per Cardiology Needs TSH and LFT Q 6 Months Hyperlipidemia On statin Will repeat Lipid Panel next Blood Work GERD Change Protonix to QD Neurogenic bladder ChronicFoley H/o CVA On Dual therapy RLS (restless legs syndrome) On Requip Will Make Robaxin PRN Depression  Is doing well  Continue on Cymbalta NO GDR for NOw Pharmacist, hospital Communication:   Labs/tests ordered:  BMP,Hepatic Panel and CBC, TSH  Total time spent in this patient care encounter was  25_  minutes; greater than 50% of the visit spent counseling patient and staff, reviewing records , Labs and coordinating care for problems addressed at this encounter.

## 2019-03-15 LAB — HEPATIC FUNCTION PANEL
ALT: 8 — AB (ref 10–40)
AST: 14 (ref 14–40)
Alkaline Phosphatase: 75 (ref 25–125)
Bilirubin, Direct: 0.1 (ref 0.01–0.4)
Bilirubin, Total: 0.3

## 2019-03-15 LAB — TSH: TSH: 1.71 (ref 0.41–5.90)

## 2019-03-15 LAB — CBC AND DIFFERENTIAL
HCT: 36 — AB (ref 41–53)
Hemoglobin: 12.3 — AB (ref 13.5–17.5)
Platelets: 228 (ref 150–399)
WBC: 6.4

## 2019-03-15 LAB — CBC: RBC: 3.89 (ref 3.87–5.11)

## 2019-03-15 LAB — BASIC METABOLIC PANEL
BUN: 20 (ref 4–21)
CO2: 31 — AB (ref 13–22)
Chloride: 100 (ref 99–108)
Creatinine: 0.8 (ref 0.6–1.3)
Glucose: 90
Potassium: 3.9 (ref 3.4–5.3)
Sodium: 139 (ref 137–147)

## 2019-03-15 LAB — COMPREHENSIVE METABOLIC PANEL
Albumin: 3.5 (ref 3.5–5.0)
Calcium: 8.9 (ref 8.7–10.7)
Globulin: 2.7

## 2019-04-06 ENCOUNTER — Encounter: Payer: Self-pay | Admitting: Nurse Practitioner

## 2019-04-06 ENCOUNTER — Non-Acute Institutional Stay (SKILLED_NURSING_FACILITY): Payer: Medicare PPO | Admitting: Nurse Practitioner

## 2019-04-06 DIAGNOSIS — I472 Ventricular tachycardia, unspecified: Secondary | ICD-10-CM

## 2019-04-06 DIAGNOSIS — K5909 Other constipation: Secondary | ICD-10-CM | POA: Diagnosis not present

## 2019-04-06 DIAGNOSIS — I5040 Unspecified combined systolic (congestive) and diastolic (congestive) heart failure: Secondary | ICD-10-CM | POA: Diagnosis not present

## 2019-04-06 DIAGNOSIS — E032 Hypothyroidism due to medicaments and other exogenous substances: Secondary | ICD-10-CM

## 2019-04-06 DIAGNOSIS — R339 Retention of urine, unspecified: Secondary | ICD-10-CM

## 2019-04-06 DIAGNOSIS — R634 Abnormal weight loss: Secondary | ICD-10-CM | POA: Insufficient documentation

## 2019-04-06 DIAGNOSIS — L309 Dermatitis, unspecified: Secondary | ICD-10-CM

## 2019-04-06 DIAGNOSIS — F339 Major depressive disorder, recurrent, unspecified: Secondary | ICD-10-CM

## 2019-04-06 DIAGNOSIS — M159 Polyosteoarthritis, unspecified: Secondary | ICD-10-CM

## 2019-04-06 DIAGNOSIS — G903 Multi-system degeneration of the autonomic nervous system: Secondary | ICD-10-CM

## 2019-04-06 DIAGNOSIS — G2581 Restless legs syndrome: Secondary | ICD-10-CM

## 2019-04-06 DIAGNOSIS — K219 Gastro-esophageal reflux disease without esophagitis: Secondary | ICD-10-CM

## 2019-04-06 DIAGNOSIS — L219 Seborrheic dermatitis, unspecified: Secondary | ICD-10-CM | POA: Insufficient documentation

## 2019-04-06 NOTE — Assessment & Plan Note (Signed)
Continue Requip. 

## 2019-04-06 NOTE — Assessment & Plan Note (Signed)
Pain is controlled, continue Tylenol, Methocarbamol

## 2019-04-06 NOTE — Assessment & Plan Note (Signed)
Excoriated dermatitis R+L buttocks, heat, moist, pressure are contributory, will apply 0.5% triamcinolone cream bid/Nystatin cream bid x 10 days. Observe.

## 2019-04-06 NOTE — Assessment & Plan Note (Signed)
Stable, continue Pantoprazole.  

## 2019-04-06 NOTE — Assessment & Plan Note (Signed)
Chronic Foley.  

## 2019-04-06 NOTE — Assessment & Plan Note (Signed)
Managed, continue Droxidopa, Midodrine.

## 2019-04-06 NOTE — Assessment & Plan Note (Addendum)
continue Levothyroxine 178mcg qd, TSH wnl 03/2019

## 2019-04-06 NOTE — Assessment & Plan Note (Signed)
His mood is stable, continue Duloxetine.  

## 2019-04-06 NOTE — Assessment & Plan Note (Signed)
Compensated, continue Torsemide 

## 2019-04-06 NOTE — Assessment & Plan Note (Signed)
Stable, continue Senokot S, Trulance, prn MiraLax.

## 2019-04-06 NOTE — Assessment & Plan Note (Addendum)
#  4Ibs in the past month, the patient denied abd pain, indigestion, nausea, vomiting, or change of appetite, observe. 03/15/19 wbc 6.4, Hgb 12.3, plt 228, TSH 1.71, Na 139, K 3.9, Bun 20, creat 0.78, eGFR 79

## 2019-04-06 NOTE — Assessment & Plan Note (Signed)
Heart rate is in control, continue Amiodarone.  

## 2019-04-06 NOTE — Progress Notes (Addendum)
Location:   [facility] Room Number: 29 Place of Service:  SNF (31) Provider:  Anvay Tennis NP  [name], MD  Patient Care Team: [name], MD as PCP - General (Internal Medicine) [name], MD as PCP - Cardiology (Cardiology) [name], MD as Attending Physician (Cardiology) Vevelyn Royals, MD as Consulting Physician (Ophthalmology) Franchot Gallo, MD as Consulting Physician (Urology) Tanda Rockers, MD as Consulting Physician (Pulmonary Disease) Allyn Kenner, MD (Dermatology) Deliah Goody, PA-C as Physician Assistant (Physician Assistant) Kenyetta Wimbish X, NP as Nurse Practitioner (Internal Medicine) Ngetich, Nelda Bucks, NP as Nurse Practitioner (Family Medicine)  Extended Emergency Contact Information Primary Emergency Contact: Grandmaison,[name] Address: [place], [place] Phone: [phone] Mobile Phone: [phone] Relation: Spouse Secondary Emergency Contact: [name],Lotito  [place] Mobile Phone: [phone] Relation: Daughter  Code Status:  Full Code Goals of care: Advanced Directive information Advanced Directives 04/06/2019  Does Patient Have a Medical Advance Directive? Yes  Type of Advance Directive Living will;Healthcare Power of Attorney  Does patient want to make changes to medical advance directive? No - Patient declined  Copy of [place] in Chart? Yes - validated most recent copy scanned in chart (See row information)  Would patient like information on creating a medical advance directive? -  Pre-existing out of facility DNR order (yellow form or pink MOST form) -     Chief Complaint  Patient presents with  . Medical Management of Chronic Issues    HPI:  Pt is a 86 y.o. male seen today for medical management of chronic diseases.    The patient resides in SNF [facility] for safety, care assistance, his mood is stable, on Cymbalta 30m  qd,  w/c for mobility, weight loss about #4Ibs in the past month. BLE edema/CHF, compensated on Torsemide 169mqd. Hx of constipation, stable, on MiraLax prn, Trulance 70m37md.  Senokot S II bid. RLS stable on Requip 0.5mg29md, prn. GERD, stable on Pantoprazole 40mg62m Orthostatic hypotension, stable, on Midodrine 5mg t72m Droxidopa 100mg t58m OA pain, stable, on Methocarbamol 250mg bi16mylenol 500mg tid75m of Vtach, heart rate is in control, on Amiodarone 100mg qd. 270mthyroidism, stable, on Levothyroxine 100mcg qd. 28mast Medical History:  Diagnosis Date  . Arthritis    "minor, back and sometimes knees" (12/15/2012)  . Bradycardia    AFib/SSS s/p St Jude PPM 04/12/2008  . CAD (coronary artery disease) 12/30/2014   CABG (LIMA-LAD, SVG-RCA, SVG-OM in 1996).  07/2009 BMS to SVG-RCA. Cath in 04/2010 with patent stents   . Cardiomyopathy, ischemic 08/25/2012  . CHF (congestive heart failure) (HCC)   . [place] knee pain 12/03/2014  . Combined congestive systolic and diastolic heart failure (HCC) 12/29/[place]6   Hx EF 41%. BNP 96.8 02/21/15 Torsemide 04/06/15 Na 142, K 4.6, Bun 16, creat 0.89 04/20/14 BNP 111.7, Na 142, K 4.6, Bun 16, creat 0.9   . Depression with anxiety 02/02/2015   02/21/15 Hgb A1c 5.8 03/10/15 MMSE 30/30   . Dizziness, after diuretic asscoiated with hypotension and responded to fluid bolus 06/05/2011   04/28/15 US carotid [place] normal bilateral arterial velocities.    . Dyspnea 7Marland Kitchen7/2016   Followed in Pulmonary clinic/ [facility]/ [facility]  - 08/11/2014  Walked RA x 1 laps @ 185 ft each stopped due to fatigue/off balance/ slow pace/  no sob or desat  -  PFT's  09/26/2014  FEV1 2.26 (85 % ) ratio 76  p no % improvement from saba with DLCO  67 % corrects to 93 % for alv volume      Since prev study 08/04/13 minimal change lung vol or dlco    . Embolic cerebral infarction ([place]) 12/06/2015  . Exertional shortness of breath    "sometimes walking" (12/15/2012)  . GERD (gastroesophageal reflux  disease)   . Gout 02/09/2015  . Heart murmur    "just told I had one today" (12/15/2012)  . Hiatal hernia   . Hyperlipidemia   . Hypertension   . Hypothyroid   . Influenza A 03/10/2016  . Insomnia   . Melanoma of back ([place]) 1976  . Myocardial infarction [facility]) 1996; 2011   "both silent" (12/15/2012)  . Nonrheumatic aortic valve stenosis   . Orthostatic hypotension   . Osteoporosis, senile   . Pacemaker   . RBBB   . Restless leg 02/02/2015  . Right leg weakness 12/06/2015  . [facility] spotted fever   . S/P CABG x 4   . Sick sinus syndrome ([place]) 01/31/2014  . Sustained ventricular tachycardia ([place]) 07/27/2014  . Urinary retention 12/30/2014   has a cathrater   Past Surgical History:  Procedure Laterality Date  . CARDIAC CATHETERIZATION  04/2010   LIMA to LAD patent,SVG to OM patent,no in-stnet restenosis RCA  . CATARACT EXTRACTION W/ INTRAOCULAR LENS  IMPLANT, BILATERAL Bilateral 2012  . CORONARY ANGIOPLASTY WITH STENT PLACEMENT  07/2009   bare metal stent to SVG to the RCA  . CORONARY ARTERY BYPASS GRAFT  1996   LIMA to LAD,SVG to RCA & SVG to OM  . INSERT / REPLACE / REMOVE PACEMAKER  2010  . MELANOMA EXCISION  05/1974 X2   "taken off my back" (12/15/2012)  . NM MYOVIEW LTD  06/2011   low risk  . PPM GENERATOR CHANGEOUT N/A 01/22/2017   Procedure: PPM GENERATOR CHANGEOUT;  Surgeon: [name], MD;  Location: [facility];  Service: Cardiovascular;  Laterality: N/A;  . TONSILLECTOMY  1938  . TRANSURETHRAL RESECTION OF PROSTATE  1986  . US ECHOCARDIOGRAPHY  07/11/2009   EF 45-50%    Allergies  Allergen Reactions  . Altace [Ramipril] Cough  . Crestor [Rosuvastatin Calcium] Rash  . Penicillins Rash and Other (See Comments)    Has patient had a PCN reaction causing immediate rash, facial/tongue/throat swelling, SOB or lightheadedness with hypotension: Yes Has patient had a PCN reaction causing severe rash involving mucus membranes or skin necrosis: No Has  patient had a PCN reaction that required hospitalization No Has patient had a PCN reaction occurring within the last 10 years: No If all of the above answers are "NO", then may proceed with Cephalosporin use.   . Sulfa Antibiotics Rash  . Sulfamethoxazole-Trimethoprim Rash    Allergies as of 04/06/2019      Reactions   Altace [ramipril] Cough   Crestor [rosuvastatin Calcium] Rash   Penicillins Rash, Other (See Comments)   Has patient had a PCN reaction causing immediate rash, facial/tongue/throat swelling, SOB or lightheadedness with hypotension: Yes Has patient had a PCN reaction causing severe rash involving mucus membranes or skin necrosis: No Has patient had a PCN reaction that required hospitalization No Has patient had a PCN reaction occurring within the last 10 years: No If all of the above answers are "NO", then may proceed with Cephalosporin use.   Sulfa Antibiotics Rash   Sulfamethoxazole-trimethoprim Rash  Medication List       Accurate as of April 06, 2019 11:38 AM. If you have any questions, ask your nurse or doctor.        acetaminophen 500 MG tablet Commonly known as: TYLENOL Take 500 mg by mouth 3 (three) times daily.   amiodarone 100 MG tablet Commonly known as: PACERONE Take 1 tablet (100 mg total) by mouth daily.   aspirin EC 81 MG tablet Take 1 tablet (81 mg total) by mouth daily.   Biofreeze 4 % Gel Generic drug: Menthol (Topical Analgesic) Apply topically. Four Times A Day   clotrimazole-betamethasone cream Commonly known as: LOTRISONE Apply 1 application topically daily as needed.   DULoxetine 20 MG capsule Commonly known as: CYMBALTA Take 20 mg by mouth daily. For anxiety,insomnia and back pain   feeding supplement (BOOST BREEZE) Liqd Take by mouth 2 (two) times daily.   feeding supplement (PRO-STAT SUGAR FREE 64) Liqd Take 30 mLs by mouth 2 (two) times a day.   fluticasone 0.05 % cream Commonly known as: CUTIVATE Apply topically 2  (two) times daily as needed. Apply small amount on  face and back also can apply as needed   fluticasone 50 MCG/ACT nasal spray Commonly known as: FLONASE Place 1 spray into both nostrils 2 (two) times daily.   guaiFENesin 100 MG/5ML Soln Commonly known as: ROBITUSSIN Take 5 mLs (100 mg total) by mouth every 4 (four) hours as needed for cough or to loosen phlegm.   ketoconazole 2 % cream Commonly known as: NIZORAL Apply 1 application topically as needed for irritation. Apply small amount behind ears and in creases beside nose BID x 7 days. MAY REPEAT PRN. (Family supplied).   levothyroxine 100 MCG tablet Commonly known as: SYNTHROID Take 100 mcg by mouth daily before breakfast.   meclizine 25 MG tablet Commonly known as: ANTIVERT Take 1 tablet (25 mg total) by mouth 2 (two) times daily as needed for dizziness.   Melatonin 3 MG Tabs Take 3 mg by mouth at bedtime.   methocarbamol 500 MG tablet Commonly known as: ROBAXIN Take 250 mg by mouth 2 (two) times daily. As needed   midodrine 5 MG tablet Commonly known as: PROAMATINE Take 5 mg by mouth 3 (three) times daily with meals. Notify provider if BP is < 90/60.   [name] 100 MG Caps Generic drug: Droxidopa TAKE 1 CAPSULE BY MOUTH THREE TIMES DAILY. TAKE LAST DOSE AT LEAST 3 HOURS BEFORE BEDTIME.   ORAJEL DRY MOUTH MT Use as directed in the mouth or throat. Swish and spit 10 ml 4 x day as needed for sore gums. Continue until dentures fit and/or next dental visit. Four Times A Day - PRN   pantoprazole 40 MG tablet Commonly known as: PROTONIX Take 40 mg by mouth daily.   Plecanatide 3 MG Tabs Commonly known as: Trulance Take 3 mg by mouth daily.   polyethylene glycol 17 g packet Commonly known as: MIRALAX / GLYCOLAX Take 17 g by mouth daily as needed. Take 1-2 capfuls in 8 oz of H2O   pravastatin 40 MG tablet Commonly known as: PRAVACHOL Take 1 tablet (40 mg total) by mouth at bedtime.   rOPINIRole 0.25 MG  tablet Commonly known as: REQUIP Take 0.5 mg by mouth 2 (two) times daily.   rOPINIRole 0.5 MG tablet Commonly known as: REQUIP Take 0.5 mg by mouth daily as needed.   sennosides-docusate sodium 8.6-50 MG tablet Commonly known as: SENOKOT-S Take 2 tablets by mouth 2 (  two) times daily.   torsemide 10 MG tablet Commonly known as: DEMADEX Take 1 tablet (10 mg total) by mouth daily.   Vitamin D3 125 MCG (5000 UT) Caps Take 5,000 Units by mouth every Monday.   zinc oxide 20 % ointment 1 application. Apply to buttocks after each incontinent episode and as needed for redness. May keep at bedside. As Needed      ROS was provided with assistance of staff.  Review of Systems  Constitutional: Positive for unexpected weight change. Negative for activity change, appetite change, chills, diaphoresis, fatigue and fever.       Weight loss #4Ibs in the past month  HENT: Positive for hearing loss. Negative for congestion and voice change.   Eyes: Negative for visual disturbance.  Respiratory: Negative for cough, shortness of breath and wheezing.   Cardiovascular: Positive for leg swelling. Negative for chest pain and palpitations.  Gastrointestinal: Negative for abdominal distention, abdominal pain, constipation, nausea and vomiting.  Genitourinary: Positive for difficulty urinating. Negative for dysuria and hematuria.       Foley  Musculoskeletal: Positive for arthralgias and gait problem.       Right shoulder limited over head ROM  Skin: Positive for rash. Negative for color change and pallor.  Neurological: Negative for dizziness, speech difficulty, weakness and headaches.       Memory lapses.   Psychiatric/Behavioral: Negative for agitation, behavioral problems, hallucinations and sleep disturbance. The patient is not nervous/anxious.     Immunization History  Administered Date(s) Administered  . Influenza, High Dose Seasonal PF 11/01/2014, 11/13/2016, 11/19/2018, 11/19/2018  .  Influenza,inj,Quad PF,6+ Mos 11/24/2017  . Influenza,inj,quad, With Preservative 11/17/2017  . Influenza-Unspecified 12/05/2013, 11/05/2014, 11/16/2015  . Moderna SARS-COVID-2 Vaccination 02/08/2019, 03/08/2019  . PPD Test 03/07/2014  . Pneumococcal Conjugate-13 10/05/2009  . Pneumococcal Polysaccharide-23 11/05/2005  . Pneumococcal-Unspecified 10/05/2009  . Tdap 08/02/2014  . Zoster 11/05/2005   Pertinent  Health Maintenance Due  Topic Date Due  . DEXA SCAN  12/14/2024  . INFLUENZA VACCINE  Completed  . PNA vac Low Risk Adult  Completed   Fall Risk  10/09/2017 10/02/2016 10/30/2015 09/05/2015 02/02/2015  Falls in the past year? No No No No No  Risk for fall due to : - - Impaired balance/gait - -   Functional Status Survey:    Vitals:   04/06/19 0930  BP: 129/74  Pulse: 70  Resp: 20  Temp: 98.1 F (36.7 C)  SpO2: 92%  Weight: 175 lb (79.4 kg)  Height: '5\' 10"'  (1.778 m)   Body mass index is 25.11 kg/m. Physical Exam Vitals and nursing note reviewed.  Constitutional:      General: He is not in acute distress.    Appearance: Normal appearance. He is not ill-appearing.  HENT:     Head: Normocephalic and atraumatic.     Nose: Nose normal.     Mouth/Throat:     Mouth: Mucous membranes are moist.  Eyes:     Extraocular Movements: Extraocular movements intact.     Conjunctiva/sclera: Conjunctivae normal.     Pupils: Pupils are equal, round, and reactive to light.  Cardiovascular:     Rate and Rhythm: Normal rate and regular rhythm.     Heart sounds: Murmur present.     Comments: Left upper chest pace maker.  Pulmonary:     Breath sounds: No wheezing or rales.  Abdominal:     General: Bowel sounds are normal. There is no distension.     Palpations: Abdomen is  soft.     Tenderness: There is no abdominal tenderness. There is no guarding or rebound.  Genitourinary:    Comments: Foley.  Musculoskeletal:     Cervical back: Normal range of motion and neck supple.     Right  lower leg: Edema present.     Left lower leg: Edema present.     Comments: Trace edema BLE. Reduced over head ROM of the right shoulder.   Skin:    General: Skin is warm and dry.     Findings: Rash present.     Comments: Excoriated rash R+L buttocks.   Neurological:     General: No focal deficit present.     Mental Status: He is alert. Mental status is at baseline.     Motor: No weakness.     Coordination: Coordination normal.     Gait: Gait abnormal.     Comments: Oriented to person, place.   Psychiatric:        Mood and Affect: Mood normal.        Behavior: Behavior normal.        Thought Content: Thought content normal.     Labs reviewed: Recent Labs    05/09/18 0000 09/23/18 0000  NA 138 141  K 3.6 4.1  BUN 18 18  CREATININE 0.8 0.7   Recent Labs    05/09/18 0000 09/23/18 0000  AST 18 23  ALT 15 14  ALKPHOS  --  64   Recent Labs    05/09/18 0000 09/23/18 0000  WBC 10.8 6.2  HGB 11.5* 12.7*  HCT 33* 38*  PLT 202 234   Lab Results  Component Value Date   TSH 2.03 08/27/2018   Lab Results  Component Value Date   HGBA1C 5.6 12/07/2015   Lab Results  Component Value Date   CHOL 93 03/30/2018   HDL 35 03/30/2018   LDLCALC 37 03/30/2018   TRIG 127 03/30/2018   CHOLHDL 2.6 05/05/2017    Significant Diagnostic Results in last 30 days:  No results found.  Assessment/Plan Combined congestive systolic and diastolic heart failure (HCC) Compensated, continue Torsemide.   Neurogenic orthostatic hypotension (HCC) Managed, continue Droxidopa, Midodrine.   Ventricular tachycardia (HCC) Heart rate is in control, continue Amiodarone.   Chronic constipation Stable, continue Senokot S, Trulance, prn MiraLax.   GERD (gastroesophageal reflux disease) Stable, continue Pantoprazole.   Hypothyroidism continue Levothyroxine 138mg qd, TSH wnl 03/2019  Osteoarthritis Pain is controlled, continue Tylenol, Methocarbamol  Urinary retention Chronic  Foley  Depression, recurrent (HCC) His mood is stable, continue Duloxetine.   RLS (restless legs syndrome) Continue Requip  Weight loss #4Ibs in the past month, the patient denied abd pain, indigestion, nausea, vomiting, or change of appetite, observe. 03/15/19 wbc 6.4, Hgb 12.3, plt 228, TSH 1.71, Na 139, K 3.9, Bun 20, creat 0.78, eGFR 79  Dermatitis Excoriated dermatitis R+L buttocks, heat, moist, pressure are contributory, will apply 0.5% triamcinolone cream bid/Nystatin cream bid x 10 days. Observe.      Family/ staff Communication: plan of care reviewed with the patient and charge nurse.   Labs/tests ordered:  none  Time spend 25 minutes.

## 2019-05-10 ENCOUNTER — Encounter: Payer: Self-pay | Admitting: Nurse Practitioner

## 2019-05-10 ENCOUNTER — Non-Acute Institutional Stay (SKILLED_NURSING_FACILITY): Payer: Medicare PPO | Admitting: Nurse Practitioner

## 2019-05-10 DIAGNOSIS — I5040 Unspecified combined systolic (congestive) and diastolic (congestive) heart failure: Secondary | ICD-10-CM

## 2019-05-10 DIAGNOSIS — K219 Gastro-esophageal reflux disease without esophagitis: Secondary | ICD-10-CM | POA: Diagnosis not present

## 2019-05-10 DIAGNOSIS — M159 Polyosteoarthritis, unspecified: Secondary | ICD-10-CM | POA: Diagnosis not present

## 2019-05-10 NOTE — Progress Notes (Signed)
Location:   [place] Room Number: 29 Place of Service:  SNF [phone]) Provider:  Marda Stalker, Lennie Odor NP  [name], MD  Patient Care Team: [name], MD as PCP - General (Internal Medicine) [name], MD as PCP - Cardiology (Cardiology) [name], MD as Attending Physician (Cardiology) Vevelyn Royals, MD as Consulting Physician (Ophthalmology) Franchot Gallo, MD as Consulting Physician (Urology) Tanda Rockers, MD as Consulting Physician (Pulmonary Disease) Allyn Kenner, MD (Dermatology) Deliah Goody, PA-C as Physician Assistant (Physician Assistant) Yaslyn Cumby X, NP as Nurse Practitioner (Internal Medicine) Ngetich, Nelda Bucks, NP as Nurse Practitioner (Family Medicine)  Extended Emergency Contact Information Primary Emergency Contact: Paletta,[name] Address: [address]          [place] Phone: [phone] Mobile Phone: [phone] Relation: Spouse Secondary Emergency Contact: [name],Schwebach  [place] Mobile Phone: [phone] Relation: Daughter  Code Status:  Full Code Goals of care: Advanced Directive information Advanced Directives 05/10/2019  Does Patient Have a Medical Advance Directive? Yes  Type of Advance Directive Living will;Healthcare Power of Attorney  Does patient want to make changes to medical advance directive? No - Patient declined  Copy of [place] in Chart? Yes - validated most recent copy scanned in chart (See row information)  Would patient like information on creating a medical advance directive? -  Pre-existing out of facility DNR order (yellow form or pink MOST form) -     Chief Complaint  Patient presents with  . Acute Visit    Lower back pain    HPI:  Pt is a 86 y.o. male seen today for an acute visit for lower back pain for 2 weeks, better today, positional, on Methocarbamol 250mg  bid, Cymbalta 20mg  qd since 04/21/18, Tylenol  500mg  tid, CT 2019 showed mild spinal stenosis and compression fx of L3. Hx of CHF, compensated, on Torsemide 10mg  qd. GERD, stable, on Pantoprazole 40mg  qd.    Past Medical History:  Diagnosis Date  . Arthritis    "minor, back and sometimes knees" (12/15/2012)  . Bradycardia    AFib/SSS s/p St Jude PPM 04/12/2008  . CAD (coronary artery disease) 12/30/2014   CABG (LIMA-LAD, SVG-RCA, SVG-OM in 1996).  07/2009 BMS to SVG-RCA. Cath in 04/2010 with patent stents   . Cardiomyopathy, ischemic 08/25/2012  . CHF (congestive heart failure) ([place])   . Chronic knee pain 12/03/2014  . Combined congestive systolic and diastolic heart failure ([place]) 02/02/2015   Hx EF 41%. BNP 96.8 02/21/15 Torsemide 04/06/15 Na 142, K 4.6, Bun 16, creat 0.89 04/20/14 BNP 111.7, Na 142, K 4.6, Bun 16, creat 0.9   . Depression with anxiety 02/02/2015   02/21/15 Hgb A1c 5.8 03/10/15 MMSE 30/30   . Dizziness, after diuretic asscoiated with hypotension and responded to fluid bolus 06/05/2011   04/28/15 US carotid R+L normal bilateral arterial velocities.    Marland Kitchen Dyspnea 08/11/2014   Followed in Pulmonary clinic/ [facility]/ [facility]  - 08/11/2014  Walked RA x 1 laps @ 185 ft each stopped due to fatigue/off balance/ slow pace/  no sob or desat  - PFT's  09/26/2014  FEV1 2.26 (85 % ) ratio 76  p no % improvement from saba with DLCO  67 % corrects to 93 % for alv volume      Since prev study 08/04/13 minimal change lung vol or dlco    . Embolic cerebral infarction ([place]) 12/06/2015  . Exertional  shortness of breath    "sometimes walking" (12/15/2012)  . GERD (gastroesophageal reflux disease)   . Gout 02/09/2015  . Heart murmur    "just told I had one today" (12/15/2012)  . Hiatal hernia   . Hyperlipidemia   . Hypertension   . Hypothyroid   . Influenza A 03/10/2016  . Insomnia   . Melanoma of back ([place]) 1976  . Myocardial infarction [facility]) 1996; 2011   "both silent" (12/15/2012)  . Nonrheumatic aortic valve stenosis   . Orthostatic  hypotension   . Osteoporosis, senile   . Pacemaker   . RBBB   . Restless leg 02/02/2015  . Right leg weakness 12/06/2015  . [facility] spotted fever   . S/P CABG x 4   . Sick sinus syndrome ([place]) 01/31/2014  . Sustained ventricular tachycardia ([place]) 07/27/2014  . Urinary retention 12/30/2014   has a cathrater   Past Surgical History:  Procedure Laterality Date  . CARDIAC CATHETERIZATION  04/2010   LIMA to LAD patent,SVG to OM patent,no in-stnet restenosis RCA  . CATARACT EXTRACTION W/ INTRAOCULAR LENS  IMPLANT, BILATERAL Bilateral 2012  . CORONARY ANGIOPLASTY WITH STENT PLACEMENT  07/2009   bare metal stent to SVG to the RCA  . CORONARY ARTERY BYPASS GRAFT  1996   LIMA to LAD,SVG to RCA & SVG to OM  . INSERT / REPLACE / REMOVE PACEMAKER  2010  . MELANOMA EXCISION  05/1974 X2   "taken off my back" (12/15/2012)  . NM MYOVIEW LTD  06/2011   low risk  . PPM GENERATOR CHANGEOUT N/A 01/22/2017   Procedure: PPM GENERATOR CHANGEOUT;  Surgeon: [name], MD;  Location: [place] [facility];  Service: Cardiovascular;  Laterality: N/A;  . TONSILLECTOMY  1938  . TRANSURETHRAL RESECTION OF PROSTATE  1986  . US ECHOCARDIOGRAPHY  07/11/2009   EF 45-50%    Allergies  Allergen Reactions  . Altace [Ramipril] Cough  . Crestor [Rosuvastatin Calcium] Rash  . Penicillins Rash and Other (See Comments)    Has patient had a PCN reaction causing immediate rash, facial/tongue/throat swelling, SOB or lightheadedness with hypotension: Yes Has patient had a PCN reaction causing severe rash involving mucus membranes or skin necrosis: No Has patient had a PCN reaction that required hospitalization No Has patient had a PCN reaction occurring within the last 10 years: No If all of the above answers are "NO", then may proceed with Cephalosporin use.   . Sulfa Antibiotics Rash  . Sulfamethoxazole-Trimethoprim Rash    Allergies as of 05/10/2019      Reactions   Altace [ramipril] Cough   Crestor  [rosuvastatin Calcium] Rash   Penicillins Rash, Other (See Comments)   Has patient had a PCN reaction causing immediate rash, facial/tongue/throat swelling, SOB or lightheadedness with hypotension: Yes Has patient had a PCN reaction causing severe rash involving mucus membranes or skin necrosis: No Has patient had a PCN reaction that required hospitalization No Has patient had a PCN reaction occurring within the last 10 years: No If all of the above answers are "NO", then may proceed with Cephalosporin use.   Sulfa Antibiotics Rash   Sulfamethoxazole-trimethoprim Rash      Medication List       Accurate as of May 10, 2019  4:49 PM. If you have any questions, ask your nurse or doctor.        acetaminophen 500 MG tablet Commonly known as: TYLENOL Take 500 mg by mouth 3 (three) times daily.   amiodarone  100 MG tablet Commonly known as: PACERONE Take 1 tablet (100 mg total) by mouth daily.   aspirin EC 81 MG tablet Take 1 tablet (81 mg total) by mouth daily.   Biofreeze 4 % Gel Generic drug: Menthol (Topical Analgesic) Apply topically. Four Times A Day   clotrimazole-betamethasone cream Commonly known as: LOTRISONE Apply 1 application topically daily as needed.   DULoxetine 20 MG capsule Commonly known as: CYMBALTA Take 20 mg by mouth daily. For anxiety,insomnia and back pain   feeding supplement (BOOST BREEZE) Liqd Take by mouth 2 (two) times daily.   feeding supplement (PRO-STAT SUGAR FREE 64) Liqd Take 30 mLs by mouth 2 (two) times a day.   fluticasone 0.05 % cream Commonly known as: CUTIVATE Apply topically 2 (two) times daily as needed. Apply small amount on  face and back also can apply as needed   fluticasone 50 MCG/ACT nasal spray Commonly known as: FLONASE Place 1 spray into both nostrils 2 (two) times daily.   guaiFENesin 100 MG/5ML Soln Commonly known as: ROBITUSSIN Take 5 mLs (100 mg total) by mouth every 4 (four) hours as needed for cough or to  loosen phlegm.   ketoconazole 2 % cream Commonly known as: NIZORAL Apply 1 application topically as needed for irritation. Apply small amount behind ears and in creases beside nose BID x 7 days. MAY REPEAT PRN. (Family supplied).   levothyroxine 100 MCG tablet Commonly known as: SYNTHROID Take 100 mcg by mouth daily before breakfast.   meclizine 25 MG tablet Commonly known as: ANTIVERT Take 1 tablet (25 mg total) by mouth 2 (two) times daily as needed for dizziness.   melatonin 3 MG Tabs tablet Take 3 mg by mouth at bedtime.   methocarbamol 500 MG tablet Commonly known as: ROBAXIN Take 250 mg by mouth 2 (two) times daily. As needed   midodrine 5 MG tablet Commonly known as: PROAMATINE Take 5 mg by mouth 3 (three) times daily with meals. Notify provider if BP is < 90/60.   [name] 100 MG Caps Generic drug: Droxidopa TAKE 1 CAPSULE BY MOUTH THREE TIMES DAILY. TAKE LAST DOSE AT LEAST 3 HOURS BEFORE BEDTIME.   ORAJEL DRY MOUTH MT Use as directed in the mouth or throat. Swish and spit 10 ml 4 x day as needed for sore gums. Continue until dentures fit and/or next dental visit. Four Times A Day - PRN   pantoprazole 40 MG tablet Commonly known as: PROTONIX Take 40 mg by mouth daily.   Plecanatide 3 MG Tabs Commonly known as: Trulance Take 3 mg by mouth daily.   polyethylene glycol 17 g packet Commonly known as: MIRALAX / GLYCOLAX Take 17 g by mouth daily as needed. Take 1-2 capfuls in 8 oz of H2O   pravastatin 40 MG tablet Commonly known as: PRAVACHOL Take 1 tablet (40 mg total) by mouth at bedtime.   rOPINIRole 0.25 MG tablet Commonly known as: REQUIP Take 0.5 mg by mouth 2 (two) times daily.   rOPINIRole 0.5 MG tablet Commonly known as: REQUIP Take 0.5 mg by mouth daily as needed.   sennosides-docusate sodium 8.6-50 MG tablet Commonly known as: SENOKOT-S Take 2 tablets by mouth 2 (two) times daily.   torsemide 10 MG tablet Commonly known as: DEMADEX Take 1  tablet (10 mg total) by mouth daily.   Vitamin D3 125 MCG (5000 UT) Caps Take 5,000 Units by mouth every Monday.   zinc oxide 20 % ointment 1 application. Apply to buttocks after each  incontinent episode and as needed for redness. May keep at bedside. As Needed       Review of Systems  Constitutional: Negative for activity change, appetite change, fatigue and fever.       Weight loss #4Ibs in the past month  HENT: Positive for hearing loss. Negative for congestion and voice change.   Eyes: Negative for visual disturbance.  Respiratory: Negative for cough and shortness of breath.   Cardiovascular: Positive for leg swelling. Negative for chest pain and palpitations.  Gastrointestinal: Negative for abdominal distention, abdominal pain and constipation.  Genitourinary: Positive for difficulty urinating. Negative for dysuria and hematuria.       Foley  Musculoskeletal: Positive for arthralgias, back pain and gait problem.       Right shoulder limited over head ROM. Positional lower back pain x 2 weeks, better today  Skin: Negative for color change.  Neurological: Negative for speech difficulty, weakness, light-headedness and headaches.       Memory lapses.   Psychiatric/Behavioral: Negative for agitation, behavioral problems and sleep disturbance. The patient is not nervous/anxious.     Immunization History  Administered Date(s) Administered  . Influenza, High Dose Seasonal PF 11/01/2014, 11/13/2016, 11/19/2018, 11/19/2018  . Influenza,inj,Quad PF,6+ Mos 11/24/2017  . Influenza,inj,quad, With Preservative 11/17/2017  . Influenza-Unspecified 12/05/2013, 11/05/2014, 11/16/2015  . Moderna SARS-COVID-2 Vaccination 02/08/2019, 03/08/2019  . PPD Test 03/07/2014  . Pneumococcal Conjugate-13 10/05/2009  . Pneumococcal Polysaccharide-23 11/05/2005  . Pneumococcal-Unspecified 10/05/2009  . Tdap 08/02/2014  . Zoster 11/05/2005   Pertinent  Health Maintenance Due  Topic Date Due  .  INFLUENZA VACCINE  09/05/2019  . DEXA SCAN  12/14/2024  . PNA vac Low Risk Adult  Completed   Fall Risk  10/09/2017 10/02/2016 10/30/2015 09/05/2015 02/02/2015  Falls in the past year? No No No No No  Risk for fall due to : - - Impaired balance/gait - -   Functional Status Survey:    Vitals:   05/10/19 1602  BP: 140/90  Pulse: 70  Resp: 18  Temp: 97.9 F (36.6 C)  SpO2: 97%  Weight: 182 lb 6.4 oz (82.7 kg)  Height: 5\' 10"  (1.778 m)   Body mass index is 26.17 kg/m. Physical Exam Vitals and nursing note reviewed.  Constitutional:      General: He is not in acute distress.    Appearance: Normal appearance. He is not ill-appearing.  HENT:     Head: Normocephalic and atraumatic.     Nose: Nose normal.     Mouth/Throat:     Mouth: Mucous membranes are moist.  Eyes:     Extraocular Movements: Extraocular movements intact.     Conjunctiva/sclera: Conjunctivae normal.     Pupils: Pupils are equal, round, and reactive to light.  Cardiovascular:     Rate and Rhythm: Normal rate and regular rhythm.     Heart sounds: Murmur present.     Comments: Left upper chest pace maker.  Pulmonary:     Breath sounds: No wheezing or rales.  Abdominal:     General: Bowel sounds are normal. There is no distension.     Palpations: Abdomen is soft.     Tenderness: There is no abdominal tenderness.  Genitourinary:    Comments: Foley.  Musculoskeletal:     Cervical back: Normal range of motion and neck supple.     Right lower leg: Edema present.     Left lower leg: Edema present.     Comments: Trace edema BLE. Reduced over head  ROM of the right shoulder. Positional lower back pain/discomfort, no lumbar spinous process tenderness palpated.   Skin:    General: Skin is warm and dry.  Neurological:     General: No focal deficit present.     Mental Status: He is alert. Mental status is at baseline.     Motor: No weakness.     Coordination: Coordination normal.     Gait: Gait abnormal.      Comments: Oriented to person, place.   Psychiatric:        Mood and Affect: Mood normal.        Behavior: Behavior normal.        Thought Content: Thought content normal.     Labs reviewed: Recent Labs    09/23/18 0000 03/15/19 0000  NA 141 139  K 4.1 3.9  CL  --  100  CO2  --  31*  BUN 18 20  CREATININE 0.7 0.8  CALCIUM  --  8.9   Recent Labs    09/23/18 0000 03/15/19 0000  AST 23 14  ALT 14 8*  ALKPHOS 64 75  ALBUMIN  --  3.5   Recent Labs    09/23/18 0000 03/15/19 0000  WBC 6.2 6.4  HGB 12.7* 12.3*  HCT 38* 36*  PLT 234 228   Lab Results  Component Value Date   TSH 1.71 03/15/2019   Lab Results  Component Value Date   HGBA1C 5.6 12/07/2015   Lab Results  Component Value Date   CHOL 93 03/30/2018   HDL 35 03/30/2018   LDLCALC 37 03/30/2018   TRIG 127 03/30/2018   CHOLHDL 2.6 05/05/2017    Significant Diagnostic Results in last 30 days:  No results found.  Assessment/Plan Osteoarthritis lower back pain for 2 weeks, better today, positional, on Methocarbamol 250mg  bid, Cymbalta 20mg  qd since 04/21/18, Tylenol 500mg  tid, CT 2019 showed mild spinal stenosis and compression fx of L3 May X-ray L spine is no better, will Meloxicam 15mg  qd x 10 days with food.   Combined congestive systolic and diastolic heart failure (HCC) Compensated, continue Torsemide  GERD (gastroesophageal reflux disease) Stable, continue Pantoprazole.      Family/ staff Communication: plan of care reviewed with the patient and charge nurse.   Labs/tests ordered:  none  Time spend 25 minutes.

## 2019-05-10 NOTE — Assessment & Plan Note (Signed)
Stable, continue Pantoprazole.  

## 2019-05-10 NOTE — Assessment & Plan Note (Signed)
Compensated, continue Torsemide 

## 2019-05-10 NOTE — Assessment & Plan Note (Signed)
lower back pain for 2 weeks, better today, positional, on Methocarbamol 250mg  bid, Cymbalta 20mg  qd since 04/21/18, Tylenol 500mg  tid, CT 2019 showed mild spinal stenosis and compression fx of L3 May X-ray L spine is no better, will Meloxicam 15mg  qd x 10 days with food.

## 2019-05-11 ENCOUNTER — Ambulatory Visit (INDEPENDENT_AMBULATORY_CARE_PROVIDER_SITE_OTHER): Payer: Medicare PPO | Admitting: *Deleted

## 2019-05-11 DIAGNOSIS — I495 Sick sinus syndrome: Secondary | ICD-10-CM | POA: Diagnosis not present

## 2019-05-11 LAB — CUP PACEART REMOTE DEVICE CHECK
Battery Remaining Longevity: 108 mo
Battery Remaining Percentage: 95.5 %
Battery Voltage: 2.96 V
Brady Statistic AP VP Percent: 27 %
Brady Statistic AP VS Percent: 72 %
Brady Statistic AS VP Percent: 1 %
Brady Statistic AS VS Percent: 1 %
Brady Statistic RA Percent Paced: 98 %
Brady Statistic RV Percent Paced: 28 %
Date Time Interrogation Session: 20210406020014
Implantable Lead Implant Date: 20100309
Implantable Lead Implant Date: 20100309
Implantable Lead Location: 753859
Implantable Lead Location: 753860
Implantable Pulse Generator Implant Date: 20181219
Lead Channel Impedance Value: 410 Ohm
Lead Channel Impedance Value: 540 Ohm
Lead Channel Pacing Threshold Amplitude: 0.25 V
Lead Channel Pacing Threshold Amplitude: 0.75 V
Lead Channel Pacing Threshold Pulse Width: 0.4 ms
Lead Channel Pacing Threshold Pulse Width: 0.4 ms
Lead Channel Sensing Intrinsic Amplitude: 11.8 mV
Lead Channel Sensing Intrinsic Amplitude: 2.1 mV
Lead Channel Setting Pacing Amplitude: 1 V
Lead Channel Setting Pacing Amplitude: 2.5 V
Lead Channel Setting Pacing Pulse Width: 0.4 ms
Lead Channel Setting Sensing Sensitivity: 2 mV
Pulse Gen Model: 2272
Pulse Gen Serial Number: 8965776

## 2019-05-12 NOTE — Progress Notes (Signed)
PPM Remote  

## 2019-05-21 ENCOUNTER — Non-Acute Institutional Stay (SKILLED_NURSING_FACILITY): Payer: Medicare PPO | Admitting: Nurse Practitioner

## 2019-05-21 ENCOUNTER — Encounter: Payer: Self-pay | Admitting: Nurse Practitioner

## 2019-05-21 DIAGNOSIS — I472 Ventricular tachycardia, unspecified: Secondary | ICD-10-CM

## 2019-05-21 DIAGNOSIS — E032 Hypothyroidism due to medicaments and other exogenous substances: Secondary | ICD-10-CM

## 2019-05-21 DIAGNOSIS — R339 Retention of urine, unspecified: Secondary | ICD-10-CM

## 2019-05-21 DIAGNOSIS — K219 Gastro-esophageal reflux disease without esophagitis: Secondary | ICD-10-CM

## 2019-05-21 DIAGNOSIS — G2581 Restless legs syndrome: Secondary | ICD-10-CM

## 2019-05-21 DIAGNOSIS — M159 Polyosteoarthritis, unspecified: Secondary | ICD-10-CM | POA: Diagnosis not present

## 2019-05-21 DIAGNOSIS — K5909 Other constipation: Secondary | ICD-10-CM

## 2019-05-21 DIAGNOSIS — F339 Major depressive disorder, recurrent, unspecified: Secondary | ICD-10-CM

## 2019-05-21 DIAGNOSIS — I5040 Unspecified combined systolic (congestive) and diastolic (congestive) heart failure: Secondary | ICD-10-CM

## 2019-05-21 DIAGNOSIS — I951 Orthostatic hypotension: Secondary | ICD-10-CM

## 2019-05-21 DIAGNOSIS — J309 Allergic rhinitis, unspecified: Secondary | ICD-10-CM

## 2019-05-21 NOTE — Assessment & Plan Note (Signed)
Chronic lowe back pain, continue Tylenol, Methocarbamol.

## 2019-05-21 NOTE — Assessment & Plan Note (Signed)
Heart rate is in control, continue Amiodarone, pacemaker is functioning well.

## 2019-05-21 NOTE — Progress Notes (Signed)
Location:   [place] Room Number: 29 Place of Service:  SNF (31) Provider: Lennie Odor Ilyse Tremain NP  [name], MD  Patient Care Team: [name], MD as PCP - General (Internal Medicine) [name], MD as PCP - Cardiology (Cardiology) [name], MD as Attending Physician (Cardiology) Vevelyn Royals, MD as Consulting Physician (Ophthalmology) Franchot Gallo, MD as Consulting Physician (Urology) Tanda Rockers, MD as Consulting Physician (Pulmonary Disease) Allyn Kenner, MD (Dermatology) Deliah Goody, PA-C as Physician Assistant (Physician Assistant) Oluwaseun Bruyere X, NP as Nurse Practitioner (Internal Medicine) Ngetich, Nelda Bucks, NP as Nurse Practitioner (Family Medicine)  Extended Emergency Contact Information Primary Emergency Contact: Hamidi,[name] Address: [address], [place] Phone: [phone] Mobile Phone: [phone] Relation: Spouse Secondary Emergency Contact: [name],Shaheen  [place] Mobile Phone: [phone] Relation: Daughter  Code Status:  Full Code Goals of care: Advanced Directive information Advanced Directives 05/21/2019  Does Patient Have a Medical Advance Directive? Yes  Type of Advance Directive Living will;Healthcare Power of Attorney  Does patient want to make changes to medical advance directive? No - Patient declined  Copy of [place] in Chart? Yes - validated most recent copy scanned in chart (See row information)  Would patient like information on creating a medical advance directive? -  Pre-existing out of facility DNR order (yellow form or pink MOST form) -     Chief Complaint  Patient presents with  . Medical Management of Chronic Issues    HPI:  Pt is a 86 y.o. male seen today for medical management of chronic diseases.    The patient resides in SNF [facility] for safety, care assistance, w/c for mobility, c/o not  comfortable in current w/c. Hx of CHF, compensated, taking Torsemide 10mg  qd. Constipation, stable, on Trulance 3mg  qd, Senokot S II bid, prn MIraLax. RLS, stable, on Requip 0.5mg  bid/daily prn. GERD, stable, on Pantoprazole 40mg  qd. Orthostatic hypotension, controlled on Midodrine 5mg  tid, Droxidopa 100mg  tid. Chronic lower back pain, stable, on Methocarbamol 250mg  bid, Tylenol 500mg  tid.  Hypothyroidism, stable, on Levothyroxine 111mcg qd. Allergic rhinitis, stable, taking Flonase bid. V tach, heart rate is in control, on Amiodarone 100mg  qd. His mood is stable, taking Duloxetine  20mg  qd.    Past Medical History:  Diagnosis Date  . Arthritis    "minor, back and sometimes knees" (12/15/2012)  . Bradycardia    AFib/SSS s/p St Jude PPM 04/12/2008  . CAD (coronary artery disease) 12/30/2014   CABG (LIMA-LAD, SVG-RCA, SVG-OM in 1996).  07/2009 BMS to SVG-RCA. Cath in 04/2010 with patent stents   . Cardiomyopathy, ischemic 08/25/2012  . CHF (congestive heart failure) ([place])   . Chronic knee pain 12/03/2014  . Combined congestive systolic and diastolic heart failure ([place]) 02/02/2015   Hx EF 41%. BNP 96.8 02/21/15 Torsemide 04/06/15 Na 142, K 4.6, Bun 16, creat 0.89 04/20/14 BNP 111.7, Na 142, K 4.6, Bun 16, creat 0.9   . Depression with anxiety 02/02/2015   02/21/15 Hgb A1c 5.8 03/10/15 MMSE 30/30   . Dizziness, after diuretic asscoiated with hypotension and responded to fluid bolus 06/05/2011   04/28/15 US carotid R+L normal bilateral arterial velocities.    Marland Kitchen Dyspnea 08/11/2014   Followed in Pulmonary clinic/ [facility]/ [facility]  - 08/11/2014  Walked RA x 1 laps @ 185 ft each stopped due to fatigue/off balance/ slow pace/  no sob  or desat  - PFT's  09/26/2014  FEV1 2.26 (85 % ) ratio 76  p no % improvement from saba with DLCO  67 % corrects to 93 % for alv volume      Since prev study 08/04/13 minimal change lung vol or dlco    . Embolic cerebral infarction ([place]) 12/06/2015  . Exertional shortness of breath     "sometimes walking" (12/15/2012)  . GERD (gastroesophageal reflux disease)   . Gout 02/09/2015  . Heart murmur    "just told I had one today" (12/15/2012)  . Hiatal hernia   . Hyperlipidemia   . Hypertension   . Hypothyroid   . Influenza A 03/10/2016  . Insomnia   . Melanoma of back ([place]) 1976  . Myocardial infarction [facility]) 1996; 2011   "both silent" (12/15/2012)  . Nonrheumatic aortic valve stenosis   . Orthostatic hypotension   . Osteoporosis, senile   . Pacemaker   . RBBB   . Restless leg 02/02/2015  . Right leg weakness 12/06/2015  . [facility] spotted fever   . S/P CABG x 4   . Sick sinus syndrome ([place]) 01/31/2014  . Sustained ventricular tachycardia ([place]) 07/27/2014  . Urinary retention 12/30/2014   has a cathrater   Past Surgical History:  Procedure Laterality Date  . CARDIAC CATHETERIZATION  04/2010   LIMA to LAD patent,SVG to OM patent,no in-stnet restenosis RCA  . CATARACT EXTRACTION W/ INTRAOCULAR LENS  IMPLANT, BILATERAL Bilateral 2012  . CORONARY ANGIOPLASTY WITH STENT PLACEMENT  07/2009   bare metal stent to SVG to the RCA  . CORONARY ARTERY BYPASS GRAFT  1996   LIMA to LAD,SVG to RCA & SVG to OM  . INSERT / REPLACE / REMOVE PACEMAKER  2010  . MELANOMA EXCISION  05/1974 X2   "taken off my back" (12/15/2012)  . NM MYOVIEW LTD  06/2011   low risk  . PPM GENERATOR CHANGEOUT N/A 01/22/2017   Procedure: PPM GENERATOR CHANGEOUT;  Surgeon: [name], MD;  Location: [facility];  Service: Cardiovascular;  Laterality: N/A;  . TONSILLECTOMY  1938  . TRANSURETHRAL RESECTION OF PROSTATE  1986  . US ECHOCARDIOGRAPHY  07/11/2009   EF 45-50%    Allergies  Allergen Reactions  . Altace [Ramipril] Cough  . Crestor [Rosuvastatin Calcium] Rash  . Penicillins Rash and Other (See Comments)    Has patient had a PCN reaction causing immediate rash, facial/tongue/throat swelling, SOB or lightheadedness with hypotension: Yes Has patient had a PCN reaction causing  severe rash involving mucus membranes or skin necrosis: No Has patient had a PCN reaction that required hospitalization No Has patient had a PCN reaction occurring within the last 10 years: No If all of the above answers are "NO", then may proceed with Cephalosporin use.   . Sulfa Antibiotics Rash  . Sulfamethoxazole-Trimethoprim Rash    Allergies as of 05/21/2019      Reactions   Altace [ramipril] Cough   Crestor [rosuvastatin Calcium] Rash   Penicillins Rash, Other (See Comments)   Has patient had a PCN reaction causing immediate rash, facial/tongue/throat swelling, SOB or lightheadedness with hypotension: Yes Has patient had a PCN reaction causing severe rash involving mucus membranes or skin necrosis: No Has patient had a PCN reaction that required hospitalization No Has patient had a PCN reaction occurring within the last 10 years: No If all of the above answers are "NO", then may proceed with Cephalosporin use.   Sulfa Antibiotics Rash  Sulfamethoxazole-trimethoprim Rash      Medication List       Accurate as of May 21, 2019 11:59 PM. If you have any questions, ask your nurse or doctor.        acetaminophen 500 MG tablet Commonly known as: TYLENOL Take 500 mg by mouth 3 (three) times daily.   amiodarone 100 MG tablet Commonly known as: PACERONE Take 1 tablet (100 mg total) by mouth daily.   aspirin EC 81 MG tablet Take 1 tablet (81 mg total) by mouth daily.   Biofreeze 4 % Gel Generic drug: Menthol (Topical Analgesic) Apply topically. Four Times A Day   clotrimazole-betamethasone cream Commonly known as: LOTRISONE Apply 1 application topically daily as needed.   DULoxetine 20 MG capsule Commonly known as: CYMBALTA Take 20 mg by mouth daily. For anxiety,insomnia and back pain   feeding supplement (BOOST BREEZE) Liqd Take by mouth 2 (two) times daily.   feeding supplement (PRO-STAT SUGAR FREE 64) Liqd Take 30 mLs by mouth 2 (two) times a day.     fluticasone 0.05 % cream Commonly known as: CUTIVATE Apply topically 2 (two) times daily as needed. Apply small amount on  face and back also can apply as needed   fluticasone 50 MCG/ACT nasal spray Commonly known as: FLONASE Place 1 spray into both nostrils 2 (two) times daily.   guaiFENesin 100 MG/5ML Soln Commonly known as: ROBITUSSIN Take 5 mLs (100 mg total) by mouth every 4 (four) hours as needed for cough or to loosen phlegm.   ketoconazole 2 % cream Commonly known as: NIZORAL Apply 1 application topically as needed for irritation. Apply small amount behind ears and in creases beside nose BID x 7 days. MAY REPEAT PRN. (Family supplied).   levothyroxine 100 MCG tablet Commonly known as: SYNTHROID Take 100 mcg by mouth daily before breakfast.   meclizine 25 MG tablet Commonly known as: ANTIVERT Take 1 tablet (25 mg total) by mouth 2 (two) times daily as needed for dizziness.   melatonin 3 MG Tabs tablet Take 3 mg by mouth at bedtime.   methocarbamol 500 MG tablet Commonly known as: ROBAXIN Take 250 mg by mouth 2 (two) times daily. As needed   midodrine 5 MG tablet Commonly known as: PROAMATINE Take 5 mg by mouth 3 (three) times daily with meals. Notify provider if BP is < 90/60.   [name] 100 MG Caps Generic drug: Droxidopa TAKE 1 CAPSULE BY MOUTH THREE TIMES DAILY. TAKE LAST DOSE AT LEAST 3 HOURS BEFORE BEDTIME.   ORAJEL DRY MOUTH MT Use as directed in the mouth or throat. Swish and spit 10 ml 4 x day as needed for sore gums. Continue until dentures fit and/or next dental visit. Four Times A Day - PRN   pantoprazole 40 MG tablet Commonly known as: PROTONIX Take 40 mg by mouth daily.   Plecanatide 3 MG Tabs Commonly known as: Trulance Take 3 mg by mouth daily.   polyethylene glycol 17 g packet Commonly known as: MIRALAX / GLYCOLAX Take 17 g by mouth daily as needed. Take 1-2 capfuls in 8 oz of H2O   pravastatin 40 MG tablet Commonly known as:  PRAVACHOL Take 1 tablet (40 mg total) by mouth at bedtime.   rOPINIRole 0.25 MG tablet Commonly known as: REQUIP Take 0.5 mg by mouth 2 (two) times daily.   rOPINIRole 0.5 MG tablet Commonly known as: REQUIP Take 0.5 mg by mouth daily as needed.   sennosides-docusate sodium 8.6-50 MG tablet Commonly  known as: SENOKOT-S Take 2 tablets by mouth 2 (two) times daily.   torsemide 20 MG tablet Commonly known as: DEMADEX Take 20 mg by mouth daily. What changed: Another medication with the same name was removed. Continue taking this medication, and follow the directions you see here. Changed by: Mikyah Alamo X Cay Kath, NP   Vitamin D3 125 MCG (5000 UT) Caps Take 5,000 Units by mouth every Monday.   zinc oxide 20 % ointment 1 application. Apply to buttocks after each incontinent episode and as needed for redness. May keep at bedside. As Needed       Review of Systems  Constitutional: Negative for activity change, appetite change, fatigue, fever and unexpected weight change.       Weight loss #4Ibs in the past month  HENT: Positive for hearing loss. Negative for congestion and voice change.   Eyes: Negative for visual disturbance.  Respiratory: Negative for cough and shortness of breath.   Cardiovascular: Positive for leg swelling. Negative for chest pain and palpitations.  Gastrointestinal: Negative for abdominal distention, abdominal pain and constipation.  Genitourinary: Positive for difficulty urinating. Negative for dysuria and hematuria.       Foley  Musculoskeletal: Positive for arthralgias, back pain and gait problem.       Right shoulder limited over head ROM. Positional lower back pain is chronic  Skin: Negative for color change.  Neurological: Negative for dizziness, syncope, speech difficulty and weakness.       Memory lapses.   Psychiatric/Behavioral: Negative for agitation, behavioral problems and sleep disturbance. The patient is not nervous/anxious.     Immunization History   Administered Date(s) Administered  . Influenza, High Dose Seasonal PF 11/01/2014, 11/13/2016, 11/19/2018, 11/19/2018  . Influenza,inj,Quad PF,6+ Mos 11/24/2017  . Influenza,inj,quad, With Preservative 11/17/2017  . Influenza-Unspecified 12/05/2013, 11/05/2014, 11/16/2015  . Moderna SARS-COVID-2 Vaccination 02/08/2019, 03/08/2019  . PPD Test 03/07/2014  . Pneumococcal Conjugate-13 10/05/2009  . Pneumococcal Polysaccharide-23 11/05/2005  . Pneumococcal-Unspecified 10/05/2009  . Tdap 08/02/2014  . Zoster 11/05/2005   Pertinent  Health Maintenance Due  Topic Date Due  . INFLUENZA VACCINE  09/05/2019  . DEXA SCAN  12/14/2024  . PNA vac Low Risk Adult  Completed   Fall Risk  10/09/2017 10/02/2016 10/30/2015 09/05/2015 02/02/2015  Falls in the past year? No No No No No  Risk for fall due to : - - Impaired balance/gait - -   Functional Status Survey:    Vitals:   05/21/19 1336  BP: 129/79  Pulse: 71  Resp: 16  Temp: 97.8 F (36.6 C)  SpO2: 98%  Weight: 180 lb 14.4 oz (82.1 kg)  Height: 5\' 10"  (1.778 m)   Body mass index is 25.96 kg/m. Physical Exam Vitals and nursing note reviewed.  Constitutional:      Appearance: Normal appearance.  HENT:     Head: Normocephalic and atraumatic.     Mouth/Throat:     Mouth: Mucous membranes are moist.  Eyes:     Extraocular Movements: Extraocular movements intact.     Conjunctiva/sclera: Conjunctivae normal.     Pupils: Pupils are equal, round, and reactive to light.  Cardiovascular:     Rate and Rhythm: Normal rate and regular rhythm.     Heart sounds: Murmur present.     Comments: Left upper chest pace maker.  Pulmonary:     Breath sounds: Rales present. No wheezing.     Comments: Right basilar rales.  Abdominal:     General: Bowel sounds are normal. There  is no distension.     Palpations: Abdomen is soft.     Tenderness: There is no abdominal tenderness.  Genitourinary:    Comments: Foley.  Musculoskeletal:     Cervical back:  Normal range of motion and neck supple.     Right lower leg: Edema present.     Left lower leg: Edema present.     Comments: Trace edema BLE. Reduced over head ROM of the right shoulder. Positional lower back pain remains no change.   Skin:    General: Skin is warm and dry.  Neurological:     General: No focal deficit present.     Mental Status: He is alert. Mental status is at baseline.     Gait: Gait abnormal.     Comments: Oriented to person, place.   Psychiatric:        Mood and Affect: Mood normal.        Behavior: Behavior normal.        Thought Content: Thought content normal.     Labs reviewed: Recent Labs    09/23/18 0000 03/15/19 0000  NA 141 139  K 4.1 3.9  CL  --  100  CO2  --  31*  BUN 18 20  CREATININE 0.7 0.8  CALCIUM  --  8.9   Recent Labs    09/23/18 0000 03/15/19 0000  AST 23 14  ALT 14 8*  ALKPHOS 64 75  ALBUMIN  --  3.5   Recent Labs    09/23/18 0000 03/15/19 0000  WBC 6.2 6.4  HGB 12.7* 12.3*  HCT 38* 36*  PLT 234 228   Lab Results  Component Value Date   TSH 1.71 03/15/2019   Lab Results  Component Value Date   HGBA1C 5.6 12/07/2015   Lab Results  Component Value Date   CHOL 93 03/30/2018   HDL 35 03/30/2018   LDLCALC 37 03/30/2018   TRIG 127 03/30/2018   CHOLHDL 2.6 05/05/2017    Significant Diagnostic Results in last 30 days:  CUP PACEART REMOTE DEVICE CHECK  Result Date: 05/11/2019 Scheduled remote reviewed. Normal device function.  Next remote 91 days- JBox, RN/CVRS   Assessment/Plan RLS (restless legs syndrome) Stable, continue Requip  Urinary retention Chronic Foley.   Osteoarthritis Chronic lowe back pain, continue Tylenol, Methocarbamol.   Hypothyroidism Stable, TSH 1.71 03/15/19, continue Levothyroxine.   GERD (gastroesophageal reflux disease) Stable, Pantoprazole 40mg  qd.   Chronic constipation Stable, continue Senokot S, prn MIraLax, Trulance.   Ventricular tachycardia (HCC) Heart rate is in  control, continue Amiodarone, pacemaker is functioning well.   Orthostatic hypotension Stable, continue Midodrine, Droxidopa  Combined congestive systolic and diastolic heart failure (HCC) Trace edema BLE, right basilar rales, continue Torsemide.   Allergic rhinitis Stable, continue Flonase.   Depression, recurrent ([place]) His mood is stable, continue Duloxetine.    Family/ staff Communication: plan of care reviewed with the patient and charge nurse.   Labs/tests ordered:  nonw  Time spend 25 minutes.

## 2019-05-21 NOTE — Assessment & Plan Note (Signed)
Chronic Foley.  

## 2019-05-21 NOTE — Assessment & Plan Note (Signed)
Trace edema BLE, right basilar rales, continue Torsemide.

## 2019-05-21 NOTE — Assessment & Plan Note (Addendum)
Stable, continue Senokot S, prn MIraLax, Trulance.

## 2019-05-21 NOTE — Assessment & Plan Note (Signed)
Stable, continue Requip

## 2019-05-21 NOTE — Assessment & Plan Note (Signed)
Stable, continue Midodrine, Droxidopa

## 2019-05-21 NOTE — Assessment & Plan Note (Signed)
Stable, Pantoprazole 40mg  qd.

## 2019-05-21 NOTE — Assessment & Plan Note (Signed)
Stable, TSH 1.71 03/15/19, continue Levothyroxine.

## 2019-05-21 NOTE — Assessment & Plan Note (Signed)
His mood is stable, continue Duloxetine.  

## 2019-05-21 NOTE — Assessment & Plan Note (Signed)
Stable, continue Flonase.  

## 2019-05-24 ENCOUNTER — Encounter: Payer: Self-pay | Admitting: Nurse Practitioner

## 2019-06-10 ENCOUNTER — Non-Acute Institutional Stay (SKILLED_NURSING_FACILITY): Payer: Medicare PPO | Admitting: Nurse Practitioner

## 2019-06-10 ENCOUNTER — Encounter: Payer: Self-pay | Admitting: Nurse Practitioner

## 2019-06-10 DIAGNOSIS — I5040 Unspecified combined systolic (congestive) and diastolic (congestive) heart failure: Secondary | ICD-10-CM | POA: Diagnosis not present

## 2019-06-10 DIAGNOSIS — I472 Ventricular tachycardia, unspecified: Secondary | ICD-10-CM

## 2019-06-10 DIAGNOSIS — M25511 Pain in right shoulder: Secondary | ICD-10-CM | POA: Diagnosis not present

## 2019-06-10 DIAGNOSIS — F339 Major depressive disorder, recurrent, unspecified: Secondary | ICD-10-CM | POA: Diagnosis not present

## 2019-06-10 NOTE — Assessment & Plan Note (Signed)
His mood is stable, continue Duloxetine.  

## 2019-06-10 NOTE — Assessment & Plan Note (Signed)
Compensated, continue Torsemide 

## 2019-06-10 NOTE — Progress Notes (Signed)
Location:   [facility] [place] Room Number: 24 Place of Service:  SNF (31) Provider: Lennie Odor Carletha Dawn NP  [name], MD  Patient Care Team: [name], MD as PCP - General (Internal Medicine) [name], MD as PCP - Cardiology (Cardiology) [name], MD as Attending Physician (Cardiology) Vevelyn Royals, MD as Consulting Physician (Ophthalmology) Franchot Gallo, MD as Consulting Physician (Urology) Tanda Rockers, MD as Consulting Physician (Pulmonary Disease) Allyn Kenner, MD (Dermatology) Deliah Goody, PA-C as Physician Assistant (Physician Assistant) Karis Rilling X, NP as Nurse Practitioner (Internal Medicine) Ngetich, Nelda Bucks, NP as Nurse Practitioner (Family Medicine)  Extended Emergency Contact Information Primary Emergency Contact: Borjon,[name] Address: [place], [place] Phone: [phone] Mobile Phone: [phone] Relation: Spouse Secondary Emergency Contact: [name],St[place]ham  [place] Mobile Phone: [phone] Relation: Daughter  Code Status:  DNR Goals of care: Advanced Directive information Advanced Directives 05/21/2019  Does Patient Have a Medical Advance Directive? Yes  Type of Advance Directive Living will;Healthcare Power of Attorney  Does patient want to make changes to medical advance directive? No - Patient declined  Copy of [place] in Chart? Yes - validated most recent copy scanned in chart (See row information)  Would patient like information on creating a medical advance directive? -  Pre-existing out of facility DNR order (yellow form or pink MOST form) -     Chief Complaint  Patient presents with  . Acute Visit    Right shoulder pain for 3 days    HPI:  Pt is a 86 y.o. male seen today for an acute visit for the right shoulder pain x 3 days, denied injury or interfering night sleep, not new, on and off,  pain 7/10 with overhead  reduced ROM, taking Tylenol 500mg  tid, Robaxin 250mg  bid for OA pain. Hx of V tach, heart rate is in control, on Amiodarone 100mg  qd. His mood is stable, on Duloxetine 20mg  qd. CHF/edema BLE, stable, taking Torsemide 20mg  qd.    Past Medical History:  Diagnosis Date  . Arthritis    "minor, back and sometimes knees" (12/15/2012)  . Bradycardia    AFib/SSS s/p St [employer] PPM 04/12/2008  . CAD (coronary artery disease) 12/30/2014   CABG (LIMA-LAD, SVG-RCA, SVG-OM in 1996).  07/2009 BMS to SVG-RCA. Cath in 04/2010 with patent stents   . Cardiomyopathy, ischemic 08/25/2012  . CHF (congestive heart failure) ([place])   . Chronic knee pain 12/03/2014  . Combined congestive systolic and diastolic heart failure ([place]) 02/02/2015   Hx EF 41%. BNP 96.8 02/21/15 Torsemide 04/06/15 Na 142, K 4.6, Bun 16, creat 0.89 04/20/14 BNP 111.7, Na 142, K 4.6, Bun 16, creat 0.9   . Depression with anxiety 02/02/2015   02/21/15 Hgb A1c 5.8 03/10/15 MMSE 30/30   . Dizziness, after diuretic asscoiated with hypotension and responded to fluid bolus 06/05/2011   04/28/15 US carotid R+L normal bilateral arterial velocities.    Marland Kitchen Dyspnea 08/11/2014   Followed in Pulmonary clinic/ [facility]/ [place]  - 08/11/2014  Walked RA x 1 laps @ 185 ft each stopped due to fatigue/off balance/ slow pace/  no sob or desat  - PFT's  09/26/2014  FEV1 2.26 (85 % ) ratio 76  p no % improvement from saba with DLCO  67 % corrects to 93 % for alv volume      Since prev study 08/04/13 minimal  change lung vol or dlco    . Embolic cerebral infarction ([place]) 12/06/2015  . Exertional shortness of breath    "sometimes walking" (12/15/2012)  . GERD (gastroesophageal reflux disease)   . Gout 02/09/2015  . Heart murmur    "just told I had one today" (12/15/2012)  . Hiatal hernia   . Hyperlipidemia   . Hypertension   . Hypothyroid   . Influenza A 03/10/2016  . Insomnia   . Melanoma of back ([place]) 1976  . Myocardial infarction [facility]) 1996; 2011   "both silent"  (12/15/2012)  . Nonrheumatic aortic valve stenosis   . Orthostatic hypotension   . Osteoporosis, senile   . Pacemaker   . RBBB   . Restless leg 02/02/2015  . Right leg weakness 12/06/2015  . [facility] spotted fever   . S/P CABG x 4   . Sick sinus syndrome ([place]) 01/31/2014  . Sustained ventricular tachycardia ([place]) 07/27/2014  . Urinary retention 12/30/2014   has a cathrater   Past Surgical History:  Procedure Laterality Date  . CARDIAC CATHETERIZATION  04/2010   LIMA to LAD patent,SVG to OM patent,no in-stnet restenosis RCA  . CATARACT EXTRACTION W/ INTRAOCULAR LENS  IMPLANT, BILATERAL Bilateral 2012  . CORONARY ANGIOPLASTY WITH STENT PLACEMENT  07/2009   bare metal stent to SVG to the RCA  . CORONARY ARTERY BYPASS GRAFT  1996   LIMA to LAD,SVG to RCA & SVG to OM  . INSERT / REPLACE / REMOVE PACEMAKER  2010  . MELANOMA EXCISION  05/1974 X2   "taken off my back" (12/15/2012)  . NM MYOVIEW LTD  06/2011   low risk  . PPM GENERATOR CHANGEOUT N/A 01/22/2017   Procedure: PPM GENERATOR CHANGEOUT;  Surgeon: [name], MD;  Location: [place] [facility];  Service: Cardiovascular;  Laterality: N/A;  . TONSILLECTOMY  1938  . TRANSURETHRAL RESECTION OF PROSTATE  1986  . US ECHOCARDIOGRAPHY  07/11/2009   EF 45-50%    Allergies  Allergen Reactions  . Altace [Ramipril] Cough  . Crestor [Rosuvastatin Calcium] Rash  . Penicillins Rash and Other (See Comments)    Has patient had a PCN reaction causing immediate rash, facial/tongue/throat swelling, SOB or lightheadedness with hypotension: Yes Has patient had a PCN reaction causing severe rash involving mucus membranes or skin necrosis: No Has patient had a PCN reaction that required hospitalization No Has patient had a PCN reaction occurring within the last 10 years: No If all of the above answers are "NO", then may proceed with Cephalosporin use.   . Sulfa Antibiotics Rash  . Sulfamethoxazole-Trimethoprim Rash    Allergies as of  06/10/2019      Reactions   Altace [ramipril] Cough   Crestor [rosuvastatin Calcium] Rash   Penicillins Rash, Other (See Comments)   Has patient had a PCN reaction causing immediate rash, facial/tongue/throat swelling, SOB or lightheadedness with hypotension: Yes Has patient had a PCN reaction causing severe rash involving mucus membranes or skin necrosis: No Has patient had a PCN reaction that required hospitalization No Has patient had a PCN reaction occurring within the last 10 years: No If all of the above answers are "NO", then may proceed with Cephalosporin use.   Sulfa Antibiotics Rash   Sulfamethoxazole-trimethoprim Rash      Medication List       Accurate as of Jun 10, 2019 11:59 PM. If you have any questions, ask your nurse or doctor.        acetaminophen 500 MG tablet  Commonly known as: TYLENOL Take 500 mg by mouth 3 (three) times daily.   amiodarone 100 MG tablet Commonly known as: PACERONE Take 1 tablet (100 mg total) by mouth daily.   aspirin EC 81 MG tablet Take 1 tablet (81 mg total) by mouth daily.   Biofreeze 4 % Gel Generic drug: Menthol (Topical Analgesic) Apply topically. Four Times A Day   clotrimazole-betamethasone cream Commonly known as: LOTRISONE Apply 1 application topically daily as needed.   DULoxetine 20 MG capsule Commonly known as: CYMBALTA Take 20 mg by mouth daily. For anxiety,insomnia and back pain   feeding supplement (BOOST BREEZE) Liqd Take by mouth 2 (two) times daily.   feeding supplement (PRO-STAT SUGAR FREE 64) Liqd Take 30 mLs by mouth 2 (two) times a day.   fluticasone 0.05 % cream Commonly known as: CUTIVATE Apply topically 2 (two) times daily as needed. Apply small amount on  face and back also can apply as needed   fluticasone 50 MCG/ACT nasal spray Commonly known as: FLONASE Place 1 spray into both nostrils 2 (two) times daily.   guaiFENesin 100 MG/5ML Soln Commonly known as: ROBITUSSIN Take 5 mLs (100 mg total)  by mouth every 4 (four) hours as needed for cough or to loosen phlegm.   ketoconazole 2 % cream Commonly known as: NIZORAL Apply 1 application topically as needed for irritation. Apply small amount behind ears and in creases beside nose BID x 7 days. MAY REPEAT PRN. (Family supplied).   levothyroxine 100 MCG tablet Commonly known as: SYNTHROID Take 100 mcg by mouth daily before breakfast.   meclizine 25 MG tablet Commonly known as: ANTIVERT Take 1 tablet (25 mg total) by mouth 2 (two) times daily as needed for dizziness.   melatonin 3 MG Tabs tablet Take 3 mg by mouth at bedtime.   meloxicam 15 MG tablet Commonly known as: MOBIC Take 15 mg by mouth daily.   methocarbamol 500 MG tablet Commonly known as: ROBAXIN Take 250 mg by mouth 2 (two) times daily. As needed   midodrine 5 MG tablet Commonly known as: PROAMATINE Take 5 mg by mouth 3 (three) times daily with meals. Notify provider if BP is < 90/60.   [name] 100 MG Caps Generic drug: Droxidopa TAKE 1 CAPSULE BY MOUTH THREE TIMES DAILY. TAKE LAST DOSE AT LEAST 3 HOURS BEFORE BEDTIME.   ORAJEL DRY MOUTH MT Use as directed in the mouth or throat. Swish and spit 10 ml 4 x day as needed for sore gums. Continue until dentures fit and/or next dental visit. Four Times A Day - PRN   pantoprazole 40 MG tablet Commonly known as: PROTONIX Take 40 mg by mouth daily.   Plecanatide 3 MG Tabs Commonly known as: Trulance Take 3 mg by mouth daily.   polyethylene glycol 17 g packet Commonly known as: MIRALAX / GLYCOLAX Take 17 g by mouth daily as needed. Take 1-2 capfuls in 8 oz of H2O   pravastatin 40 MG tablet Commonly known as: PRAVACHOL Take 1 tablet (40 mg total) by mouth at bedtime.   rOPINIRole 0.25 MG tablet Commonly known as: REQUIP Take 0.5 mg by mouth 2 (two) times daily.   rOPINIRole 0.5 MG tablet Commonly known as: REQUIP Take 0.5 mg by mouth daily as needed.   sennosides-docusate sodium 8.6-50 MG  tablet Commonly known as: SENOKOT-S Take 2 tablets by mouth 2 (two) times daily.   torsemide 20 MG tablet Commonly known as: DEMADEX Take 20 mg by mouth daily.  Vitamin D3 125 MCG (5000 UT) Caps Take 5,000 Units by mouth every Monday.   zinc oxide 20 % ointment 1 application. Apply to buttocks after each incontinent episode and as needed for redness. May keep at bedside. As Needed       Review of Systems  Constitutional: Negative for activity change, appetite change and fever.       Weight loss #4Ibs in the past month  HENT: Positive for hearing loss. Negative for congestion and voice change.   Eyes: Negative for visual disturbance.  Respiratory: Negative for cough and shortness of breath.   Cardiovascular: Positive for leg swelling. Negative for chest pain and palpitations.  Gastrointestinal: Negative for abdominal pain and constipation.  Genitourinary: Positive for difficulty urinating. Negative for dysuria and hematuria.       Foley  Musculoskeletal: Positive for arthralgias, back pain and gait problem.       Right shoulder limited over head ROM with pain.  Positional lower back pain is chronic  Skin: Negative for color change.  Neurological: Negative for speech difficulty, weakness and light-headedness.       Memory lapses.   Psychiatric/Behavioral: Negative for agitation, behavioral problems and sleep disturbance.    Immunization History  Administered Date(s) Administered  . Influenza, High Dose Seasonal PF 11/01/2014, 11/13/2016, 11/19/2018, 11/19/2018  . Influenza,inj,Quad PF,6+ Mos 11/24/2017  . Influenza,inj,quad, With Preservative 11/17/2017  . Influenza-Unspecified 12/05/2013, 11/05/2014, 11/16/2015  . Moderna SARS-COVID-2 Vaccination 02/08/2019, 03/08/2019  . PPD Test 03/07/2014  . Pneumococcal Conjugate-13 10/05/2009  . Pneumococcal Polysaccharide-23 11/05/2005  . Pneumococcal-Unspecified 10/05/2009  . Tdap 08/02/2014  . Zoster 11/05/2005   Pertinent   Health Maintenance Due  Topic Date Due  . INFLUENZA VACCINE  09/05/2019  . DEXA SCAN  12/14/2024  . PNA vac Low Risk Adult  Completed   Fall Risk  10/09/2017 10/02/2016 10/30/2015 09/05/2015 02/02/2015  Falls in the past year? No No No No No  Risk for fall due to : - - Impaired balance/gait - -   Functional Status Survey:    Vitals:   06/10/19 1613  BP: (!) 158/73  Pulse: 70  Resp: 18  Temp: (!) 97.2 F (36.2 C)  SpO2: 96%  Weight: 177 lb 9.6 oz (80.6 kg)  Height: 5\' 10"  (1.778 m)   Body mass index is 25.48 kg/m. Physical Exam Vitals and nursing note reviewed.  Constitutional:      Appearance: Normal appearance.  HENT:     Head: Normocephalic and atraumatic.     Mouth/Throat:     Mouth: Mucous membranes are moist.  Eyes:     Extraocular Movements: Extraocular movements intact.     Conjunctiva/sclera: Conjunctivae normal.     Pupils: Pupils are equal, round, and reactive to light.  Cardiovascular:     Rate and Rhythm: Normal rate and regular rhythm.     Heart sounds: Murmur present.     Comments: Left upper chest pace maker.  Pulmonary:     Breath sounds: No rales.  Abdominal:     General: Bowel sounds are normal.     Palpations: Abdomen is soft.     Tenderness: There is no abdominal tenderness.  Genitourinary:    Comments: Foley.  Musculoskeletal:     Cervical back: Normal range of motion and neck supple.     Right lower leg: Edema present.     Left lower leg: Edema present.     Comments: Trace edema BLE. Reduced over head ROM of the right shoulder-flare up pain  regardless position or activities.  Positional lower back pain remains no change.   Skin:    General: Skin is warm and dry.     Findings: No bruising.  Neurological:     General: No focal deficit present.     Mental Status: He is alert. Mental status is at baseline.     Gait: Gait abnormal.     Comments: Oriented to person, place.   Psychiatric:        Mood and Affect: Mood normal.        Behavior:  Behavior normal.        Thought Content: Thought content normal.     Labs reviewed: Recent Labs    09/23/18 0000 03/15/19 0000  NA 141 139  K 4.1 3.9  CL  --  100  CO2  --  31*  BUN 18 20  CREATININE 0.7 0.8  CALCIUM  --  8.9   Recent Labs    09/23/18 0000 03/15/19 0000  AST 23 14  ALT 14 8*  ALKPHOS 64 75  ALBUMIN  --  3.5   Recent Labs    09/23/18 0000 03/15/19 0000  WBC 6.2 6.4  HGB 12.7* 12.3*  HCT 38* 36*  PLT 234 228   Lab Results  Component Value Date   TSH 1.71 03/15/2019   Lab Results  Component Value Date   HGBA1C 5.6 12/07/2015   Lab Results  Component Value Date   CHOL 93 03/30/2018   HDL 35 03/30/2018   LDLCALC 37 03/30/2018   TRIG 127 03/30/2018   CHOLHDL 2.6 05/05/2017    Significant Diagnostic Results in last 30 days:  No results found.  Assessment/Plan Right shoulder pain Not new, on and off, denied injury, pain 7/10 with overhead ROM, X-ray R shoulder, schedule Mobic 15mg  qd with food x 5 days, continue Tylenol 500mg  tid, Robaxin 250mg  bid.   Combined congestive systolic and diastolic heart failure (HCC) Compensated, continue Torsemide.   Ventricular tachycardia (HCC) Heart rate is in control, continue Amiodarone.   Depression, recurrent ([place]) His mood is stable, continue Duloxetine.     Family/ staff Communication: plan of care reviewed with the patient and charge nurse.   Labs/tests ordered:  X-ray 3 views R shoulder.   Time spend 25 minutes.

## 2019-06-10 NOTE — Assessment & Plan Note (Signed)
Heart rate is in control, continue Amiodarone.  

## 2019-06-10 NOTE — Assessment & Plan Note (Signed)
Not new, on and off, denied injury, pain 7/10 with overhead ROM, X-ray R shoulder, schedule Mobic 15mg  qd with food x 5 days, continue Tylenol 500mg  tid, Robaxin 250mg  bid.

## 2019-06-11 ENCOUNTER — Encounter: Payer: Self-pay | Admitting: Nurse Practitioner

## 2019-06-13 IMAGING — CR DG CHEST 2V
2 series · 2 of 2 positions shown · non-contrast
Comparison: Radiographs and CT 11/11/2017

CLINICAL DATA: Hematuria. Patient reports penile pain. Evaluate for
pneumonia.

EXAM:
CHEST - 2 VIEW

[w chest lat]
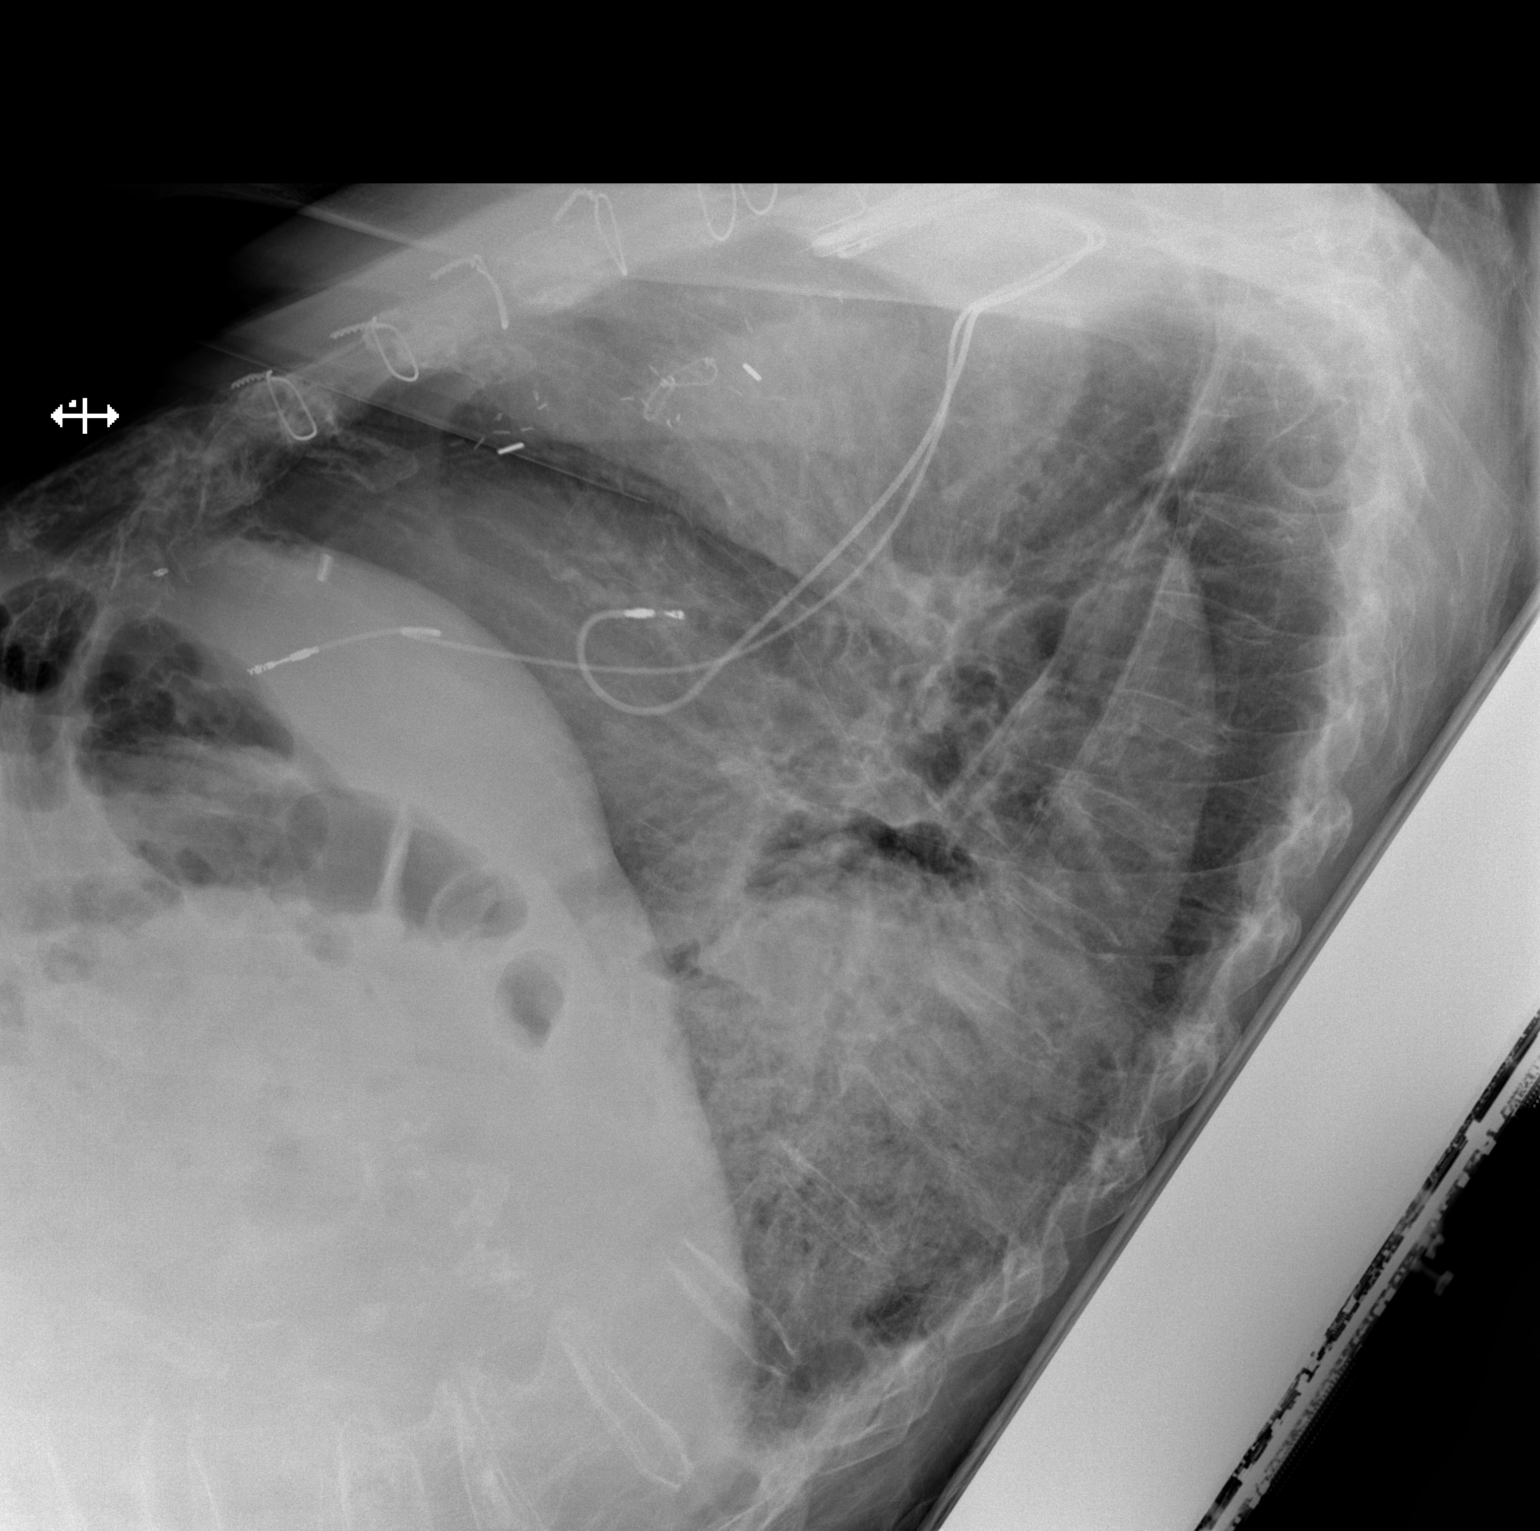

[x chest ap]
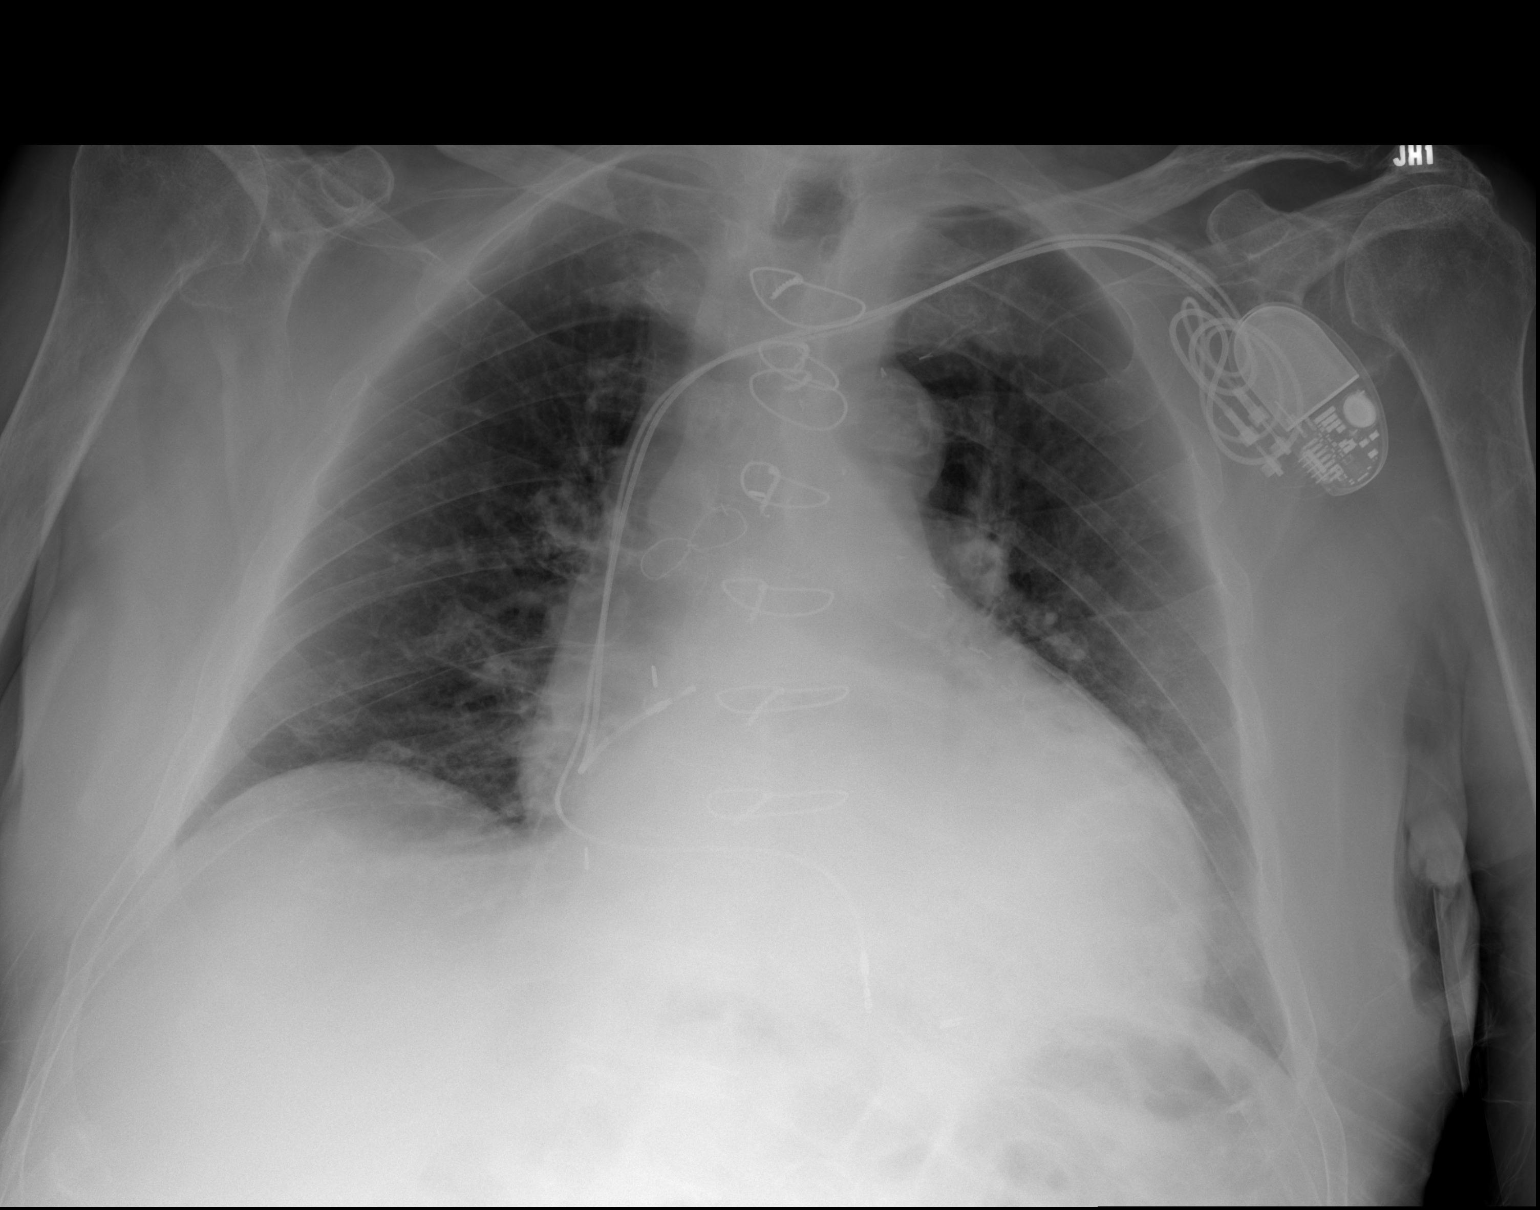

[2 of 2 positions shown; findings below may reference images not displayed]

FINDINGS: Left-sided pacemaker remains in place. Post median sternotomy with
unchanged cardiomegaly. Large retrocardiac hiatal hernia again seen.
Ground-glass opacities on prior chest CT are not seen
radiographically. No confluent airspace disease. No pulmonary edema
or pleural effusion. No acute osseous abnormalities.
IMPRESSION: 1. No radiographic evidence of pneumonia.
2. Unchanged cardiomegaly and large retrocardiac hiatal hernia.

## 2019-07-01 ENCOUNTER — Non-Acute Institutional Stay (SKILLED_NURSING_FACILITY): Payer: Medicare PPO | Admitting: Internal Medicine

## 2019-07-01 ENCOUNTER — Encounter: Payer: Self-pay | Admitting: Internal Medicine

## 2019-07-01 DIAGNOSIS — M159 Polyosteoarthritis, unspecified: Secondary | ICD-10-CM | POA: Diagnosis not present

## 2019-07-01 DIAGNOSIS — I472 Ventricular tachycardia, unspecified: Secondary | ICD-10-CM

## 2019-07-01 DIAGNOSIS — K5909 Other constipation: Secondary | ICD-10-CM

## 2019-07-01 DIAGNOSIS — I5040 Unspecified combined systolic (congestive) and diastolic (congestive) heart failure: Secondary | ICD-10-CM

## 2019-07-01 DIAGNOSIS — R339 Retention of urine, unspecified: Secondary | ICD-10-CM

## 2019-07-01 DIAGNOSIS — E032 Hypothyroidism due to medicaments and other exogenous substances: Secondary | ICD-10-CM

## 2019-07-01 DIAGNOSIS — G2581 Restless legs syndrome: Secondary | ICD-10-CM

## 2019-07-01 DIAGNOSIS — K219 Gastro-esophageal reflux disease without esophagitis: Secondary | ICD-10-CM

## 2019-07-01 DIAGNOSIS — I951 Orthostatic hypotension: Secondary | ICD-10-CM

## 2019-07-01 NOTE — Progress Notes (Signed)
Location:   [facility] Room Number: 29 Place of Service:  SNF [id number]) Provider:  [name]., MD  [name], MD  Patient Care Team: [name], MD as PCP - General (Internal Medicine) [name], MD as PCP - Cardiology (Cardiology) [name], MD as Attending Physician (Cardiology) Vevelyn Royals, MD as Consulting Physician (Ophthalmology) Franchot Gallo, MD as Consulting Physician (Urology) Tanda Rockers, MD as Consulting Physician (Pulmonary Disease) Allyn Kenner, MD (Dermatology) Deliah Goody, PA-C as Physician Assistant (Physician Assistant) Mast, Man X, NP as Nurse Practitioner (Internal Medicine) Ngetich, Nelda Bucks, NP as Nurse Practitioner (Family Medicine)  Extended Emergency Contact Information Primary Emergency Contact: Dripps,[name] Address: [place], [place] Phone: [phone] Mobile Phone: [phone] Relation: Spouse Secondary Emergency Contact: [name],Wrubel  [place] Mobile Phone: [phone] Relation: Daughter  Code Status:  Full Goals of care: Advanced Directive information Advanced Directives 07/01/2019  Does Patient Have a Medical Advance Directive? Yes  Type of Advance Directive [place]  Does patient want to make changes to medical advance directive? No - Patient declined  Copy of [place] in Chart? Yes - validated most recent copy scanned in chart (See row information)  Would patient like information on creating a medical advance directive? -  Pre-existing out of facility DNR order (yellow form or pink MOST form) -     Chief Complaint  Patient presents with  . Medical Management of Chronic Issues    Routine Visit     HPI:  Pt is a 86 y.o. male seen today for medical management of chronic diseases.    He has h/o CAD s/p Bypass surgery with Extensive Inferior Wall Scarring, Ischemic  Cardiomyopathy withsystolic heart failure EF of 35-40% Moderate AS,history of ventricular tachycardia stable on amiodarone, sinus node dysfunction with dual-chamber PPP, severe orthostatic hypotension neurogenic,history of compression fractures, history of ischemic stroke in the past on dual therapy.,History of chronic indwelling Foley catheter  Skin Tear Had 2 skin tears . Does not look Infected Just red  Gait issues Working with therapy Shoulder Pain  Xray showed DJD. On Meloxicam PRN His weight is stable No New Nursing issues  Past Medical History:  Diagnosis Date  . Arthritis    "minor, back and sometimes knees" (12/15/2012)  . Bradycardia    AFib/SSS s/p St Jude PPM 04/12/2008  . CAD (coronary artery disease) 12/30/2014   CABG (LIMA-LAD, SVG-RCA, SVG-OM in 1996).  07/2009 BMS to SVG-RCA. Cath in 04/2010 with patent stents   . Cardiomyopathy, ischemic 08/25/2012  . CHF (congestive heart failure) ([place])   . Chronic knee pain 12/03/2014  . Combined congestive systolic and diastolic heart failure ([place]) 02/02/2015   Hx EF 41%. BNP 96.8 02/21/15 Torsemide 04/06/15 Na 142, K 4.6, Bun 16, creat 0.89 04/20/14 BNP 111.7, Na 142, K 4.6, Bun 16, creat 0.9   . Depression with anxiety 02/02/2015   02/21/15 Hgb A1c 5.8 03/10/15 MMSE 30/30   . Dizziness, after diuretic asscoiated with hypotension and responded to fluid bolus 06/05/2011   04/28/15 US carotid R+L normal bilateral arterial velocities.    Marland Kitchen Dyspnea 08/11/2014   Followed in Pulmonary clinic/ [facility]/ [facility]  - 08/11/2014  Walked RA x 1 laps @ 185 ft each stopped due to fatigue/off balance/ slow pace/  no sob or desat  - PFT's  09/26/2014  FEV1 2.26 (  85 % ) ratio 76  p no % improvement from saba with DLCO  67 % corrects to 93 % for alv volume      Since prev study 08/04/13 minimal change lung vol or dlco    . Embolic cerebral infarction ([place]) 12/06/2015  . Exertional shortness of breath    "sometimes walking" (12/15/2012)  . GERD  (gastroesophageal reflux disease)   . Gout 02/09/2015  . Heart murmur    "just told I had one today" (12/15/2012)  . Hiatal hernia   . Hyperlipidemia   . Hypertension   . Hypothyroid   . Influenza A 03/10/2016  . Insomnia   . Melanoma of back ([place]) 1976  . Myocardial infarction [facility]) 1996; 2011   "both silent" (12/15/2012)  . Nonrheumatic aortic valve stenosis   . Orthostatic hypotension   . Osteoporosis, senile   . Pacemaker   . RBBB   . Restless leg 02/02/2015  . Right leg weakness 12/06/2015  . [facility] spotted fever   . S/P CABG x 4   . Sick sinus syndrome ([place]) 01/31/2014  . Sustained ventricular tachycardia ([place]) 07/27/2014  . Urinary retention 12/30/2014   has a cathrater   Past Surgical History:  Procedure Laterality Date  . CARDIAC CATHETERIZATION  04/2010   LIMA to LAD patent,SVG to OM patent,no in-stnet restenosis RCA  . CATARACT EXTRACTION W/ INTRAOCULAR LENS  IMPLANT, BILATERAL Bilateral 2012  . CORONARY ANGIOPLASTY WITH STENT PLACEMENT  07/2009   bare metal stent to SVG to the RCA  . CORONARY ARTERY BYPASS GRAFT  1996   LIMA to LAD,SVG to RCA & SVG to OM  . INSERT / REPLACE / REMOVE PACEMAKER  2010  . MELANOMA EXCISION  05/1974 X2   "taken off my back" (12/15/2012)  . NM MYOVIEW LTD  06/2011   low risk  . PPM GENERATOR CHANGEOUT N/A 01/22/2017   Procedure: PPM GENERATOR CHANGEOUT;  Surgeon: [name], MD;  Location: [facility];  Service: Cardiovascular;  Laterality: N/A;  . TONSILLECTOMY  1938  . TRANSURETHRAL RESECTION OF PROSTATE  1986  . US ECHOCARDIOGRAPHY  07/11/2009   EF 45-50%    Allergies  Allergen Reactions  . Altace [Ramipril] Cough  . Crestor [Rosuvastatin Calcium] Rash  . Penicillins Rash and Other (See Comments)    Has patient had a PCN reaction causing immediate rash, facial/tongue/throat swelling, SOB or lightheadedness with hypotension: Yes Has patient had a PCN reaction causing severe rash involving mucus membranes or  skin necrosis: No Has patient had a PCN reaction that required hospitalization No Has patient had a PCN reaction occurring within the last 10 years: No If all of the above answers are "NO", then may proceed with Cephalosporin use.   . Sulfa Antibiotics Rash  . Sulfamethoxazole-Trimethoprim Rash    Allergies as of 07/01/2019      Reactions   Altace [ramipril] Cough   Crestor [rosuvastatin Calcium] Rash   Penicillins Rash, Other (See Comments)   Has patient had a PCN reaction causing immediate rash, facial/tongue/throat swelling, SOB or lightheadedness with hypotension: Yes Has patient had a PCN reaction causing severe rash involving mucus membranes or skin necrosis: No Has patient had a PCN reaction that required hospitalization No Has patient had a PCN reaction occurring within the last 10 years: No If all of the above answers are "NO", then may proceed with Cephalosporin use.   Sulfa Antibiotics Rash   Sulfamethoxazole-trimethoprim Rash      Medication List  Accurate as of Jul 01, 2019 11:59 PM. If you have any questions, ask your nurse or doctor.        STOP taking these medications   Biofreeze 4 % Gel Generic drug: Menthol (Topical Analgesic) Stopped by: [name], MD   meloxicam 15 MG tablet Commonly known as: MOBIC Stopped by: [name], MD   zinc oxide 20 % ointment Stopped by: [name], MD     TAKE these medications   acetaminophen 500 MG tablet Commonly known as: TYLENOL Take 500 mg by mouth 3 (three) times daily.   amiodarone 100 MG tablet Commonly known as: PACERONE Take 1 tablet (100 mg total) by mouth daily.   aspirin EC 81 MG tablet Take 1 tablet (81 mg total) by mouth daily.   clotrimazole-betamethasone cream Commonly known as: LOTRISONE Apply 1 application topically daily as needed.   DULoxetine 20 MG capsule Commonly known as: CYMBALTA Take 20 mg by mouth daily. For anxiety,insomnia and back pain   feeding supplement  (BOOST BREEZE) Liqd Take by mouth 2 (two) times daily.   feeding supplement (PRO-STAT SUGAR FREE 64) Liqd Take 30 mLs by mouth daily.   fluorouracil 5 % cream Commonly known as: EFUDEX Apply topically 2 (two) times daily.   fluticasone 0.05 % cream Commonly known as: CUTIVATE Apply topically 2 (two) times daily as needed. Apply small amount on  face and back also can apply as needed   fluticasone 50 MCG/ACT nasal spray Commonly known as: FLONASE Place 1 spray into both nostrils 2 (two) times daily.   guaiFENesin 100 MG/5ML Soln Commonly known as: ROBITUSSIN Take 5 mLs (100 mg total) by mouth every 4 (four) hours as needed for cough or to loosen phlegm.   ketoconazole 2 % cream Commonly known as: NIZORAL Apply 1 application topically as needed for irritation. Apply small amount behind ears and in creases beside nose BID x 7 days. MAY REPEAT PRN. (Family supplied).   levothyroxine 100 MCG tablet Commonly known as: SYNTHROID Take 100 mcg by mouth daily before breakfast.   meclizine 25 MG tablet Commonly known as: ANTIVERT Take 1 tablet (25 mg total) by mouth 2 (two) times daily as needed for dizziness.   melatonin 3 MG Tabs tablet Take 3 mg by mouth at bedtime.   methocarbamol 500 MG tablet Commonly known as: ROBAXIN Take 250 mg by mouth 2 (two) times daily. As needed   midodrine 5 MG tablet Commonly known as: PROAMATINE Take 5 mg by mouth 3 (three) times daily with meals. Notify provider if BP is < 90/60.   [name] 100 MG Caps Generic drug: Droxidopa TAKE 1 CAPSULE BY MOUTH THREE TIMES DAILY. TAKE LAST DOSE AT LEAST 3 HOURS BEFORE BEDTIME.   ORAJEL DRY MOUTH MT Use as directed in the mouth or throat. Swish and spit 10 ml 4 x day as needed for sore gums. Continue until dentures fit and/or next dental visit. Four Times A Day - PRN   pantoprazole 40 MG tablet Commonly known as: PROTONIX Take 40 mg by mouth daily.   Plecanatide 3 MG Tabs Commonly known as:  Trulance Take 3 mg by mouth daily.   polyethylene glycol 17 g packet Commonly known as: MIRALAX / GLYCOLAX Take 17 g by mouth daily as needed. Take 1-2 capfuls in 8 oz of H2O   pravastatin 40 MG tablet Commonly known as: PRAVACHOL Take 1 tablet (40 mg total) by mouth at bedtime.   rOPINIRole 0.25 MG tablet Commonly  known as: REQUIP Take 0.5 mg by mouth 2 (two) times daily.   rOPINIRole 0.5 MG tablet Commonly known as: REQUIP Take 0.5 mg by mouth daily as needed.   sennosides-docusate sodium 8.6-50 MG tablet Commonly known as: SENOKOT-S Take 2 tablets by mouth 2 (two) times daily.   torsemide 10 MG tablet Commonly known as: DEMADEX Take 20 mg by mouth daily. 20 mg once a day   Vitamin D3 125 MCG (5000 UT) Caps Take 5,000 Units by mouth every Monday.       Review of Systems  Constitutional: Negative.   HENT: Negative.   Respiratory: Negative.   Cardiovascular: Positive for leg swelling.  Gastrointestinal: Positive for constipation.  Genitourinary: Negative.   Musculoskeletal: Positive for arthralgias, gait problem and myalgias.  Skin: Positive for wound.  Neurological: Positive for dizziness and weakness.  Psychiatric/Behavioral: The patient is nervous/anxious.     Immunization History  Administered Date(s) Administered  . Influenza, High Dose Seasonal PF 11/01/2014, 11/13/2016, 11/19/2018, 11/19/2018  . Influenza,inj,Quad PF,6+ Mos 11/24/2017  . Influenza,inj,quad, With Preservative 11/17/2017  . Influenza-Unspecified 12/05/2013, 11/05/2014, 11/16/2015  . Moderna SARS-COVID-2 Vaccination 02/08/2019, 03/08/2019  . PPD Test 03/07/2014  . Pneumococcal Conjugate-13 10/05/2009  . Pneumococcal Polysaccharide-23 11/05/2005  . Pneumococcal-Unspecified 10/05/2009  . Tdap 08/02/2014  . Zoster 11/05/2005   Pertinent  Health Maintenance Due  Topic Date Due  . INFLUENZA VACCINE  09/05/2019  . DEXA SCAN  12/14/2024  . PNA vac Low Risk Adult  Completed   Fall Risk   10/09/2017 10/02/2016 10/30/2015 09/05/2015 02/02/2015  Falls in the past year? No No No No No  Risk for fall due to : - - Impaired balance/gait - -   Functional Status Survey:    Vitals:   07/01/19 1549  BP: 124/65  Pulse: 69  Temp: 97.6 F (36.4 C)  SpO2: 96%  Weight: 177 lb 1.6 oz (80.3 kg)  Height: 5\' 10"  (1.778 m)   Body mass index is 25.41 kg/m. Physical Exam Vitals reviewed.  Constitutional:      Appearance: Normal appearance.  HENT:     Head: Normocephalic.     Nose: Nose normal.     Mouth/Throat:     Mouth: Mucous membranes are moist.     Pharynx: Oropharynx is clear.  Eyes:     Pupils: Pupils are equal, round, and reactive to light.  Cardiovascular:     Rate and Rhythm: Normal rate and regular rhythm.     Pulses: Normal pulses.     Heart sounds: Murmur present.  Pulmonary:     Effort: Pulmonary effort is normal.     Breath sounds: Normal breath sounds.  Abdominal:     General: Abdomen is flat. Bowel sounds are normal.     Palpations: Abdomen is soft.  Musculoskeletal:     Cervical back: Neck supple.  Skin:    Comments: 2 skin tear in Both Hands. Do Not look Infected. Does have some redness and Mildily Tender  Neurological:     Mental Status: He is alert and oriented to person, place, and time.  Psychiatric:        Mood and Affect: Mood normal.        Thought Content: Thought content normal.     Labs reviewed: Recent Labs    09/23/18 0000 03/15/19 0000  NA 141 139  K 4.1 3.9  CL  --  100  CO2  --  31*  BUN 18 20  CREATININE 0.7 0.8  CALCIUM  --  8.9   Recent Labs    09/23/18 0000 03/15/19 0000  AST 23 14  ALT 14 8*  ALKPHOS 64 75  ALBUMIN  --  3.5   Recent Labs    09/23/18 0000 03/15/19 0000  WBC 6.2 6.4  HGB 12.7* 12.3*  HCT 38* 36*  PLT 234 228   Lab Results  Component Value Date   TSH 1.71 03/15/2019   Lab Results  Component Value Date   HGBA1C 5.6 12/07/2015   Lab Results  Component Value Date   CHOL 93 03/30/2018    HDL 35 03/30/2018   LDLCALC 37 03/30/2018   TRIG 127 03/30/2018   CHOLHDL 2.6 05/05/2017    Significant Diagnostic Results in last 30 days:  No results found.  Assessment/Plan  Skin tear Continue to Follow No Antibiotics right now Combined systolic and diastolic congestive heart failure, Continue Demadex Bun and Creat Stable Ventricular tachycardia (HCC) s/p PPP ON Amiodarone Needs TSH and Hepatic Panel Q 6 months Osteoarthritis of multiple joints, unspecified osteoarthritis type Can Use Meloxicam PRN On Robaxin also Hypothyroidism due to medication TSH Normal RLS (restless legs syndrome) Continue Requip Urinary retention Chronic Catheter Gastroesophageal reflux disease without esophagitis On Protonix Neurogenic Orthostatic hypotension On Droxidopa and Midodrine Chronic constipation Trulance Depression On Cymbalta Doing well. No GDR Chronic left-sided low back pain without sciatica Had CT scan in 2019 which Showed Mild Spinal stenosis and Compression Fracture of L3 On Robaxin and Meloxicam PRN Family/ staff Communication:   Labs/tests ordered:

## 2019-07-16 ENCOUNTER — Encounter: Payer: Self-pay | Admitting: Nurse Practitioner

## 2019-07-16 ENCOUNTER — Non-Acute Institutional Stay (SKILLED_NURSING_FACILITY): Payer: Medicare PPO | Admitting: Nurse Practitioner

## 2019-07-16 DIAGNOSIS — I472 Ventricular tachycardia, unspecified: Secondary | ICD-10-CM

## 2019-07-16 DIAGNOSIS — K5909 Other constipation: Secondary | ICD-10-CM | POA: Diagnosis not present

## 2019-07-16 DIAGNOSIS — E032 Hypothyroidism due to medicaments and other exogenous substances: Secondary | ICD-10-CM

## 2019-07-16 DIAGNOSIS — K219 Gastro-esophageal reflux disease without esophagitis: Secondary | ICD-10-CM

## 2019-07-16 DIAGNOSIS — R339 Retention of urine, unspecified: Secondary | ICD-10-CM

## 2019-07-16 DIAGNOSIS — I5040 Unspecified combined systolic (congestive) and diastolic (congestive) heart failure: Secondary | ICD-10-CM | POA: Diagnosis not present

## 2019-07-16 DIAGNOSIS — G903 Multi-system degeneration of the autonomic nervous system: Secondary | ICD-10-CM

## 2019-07-16 DIAGNOSIS — L219 Seborrheic dermatitis, unspecified: Secondary | ICD-10-CM

## 2019-07-16 DIAGNOSIS — G2581 Restless legs syndrome: Secondary | ICD-10-CM

## 2019-07-16 DIAGNOSIS — F339 Major depressive disorder, recurrent, unspecified: Secondary | ICD-10-CM

## 2019-07-16 DIAGNOSIS — M159 Polyosteoarthritis, unspecified: Secondary | ICD-10-CM

## 2019-07-16 NOTE — Assessment & Plan Note (Signed)
Stable, continue Ropinirole.  

## 2019-07-16 NOTE — Assessment & Plan Note (Addendum)
Multiple sites, left lower back, continue Tylenol 500mg  tid,  Robaxin 250mg  bid,

## 2019-07-16 NOTE — Assessment & Plan Note (Signed)
Stable, continue Pantoprazole.  

## 2019-07-16 NOTE — Assessment & Plan Note (Signed)
Chronic DOE, cough, trace edema BLE, continue Torsemide 20mg  qd.

## 2019-07-16 NOTE — Assessment & Plan Note (Signed)
S/p PPP, heart rate is in control, continue Amiodarone.

## 2019-07-16 NOTE — Assessment & Plan Note (Signed)
Nasolabial, facial, under Dermatology tx. Observe.

## 2019-07-16 NOTE — Progress Notes (Addendum)
Location:   Room 29   Place of Service:   [place] Provider:  Marda Stalker, Lennie Odor, NP  [name], MD  Patient Care Team: [name], MD as PCP - General (Internal Medicine) [name], MD as PCP - Cardiology (Cardiology) [name], MD as Attending Physician (Cardiology) Vevelyn Royals, MD as Consulting Physician (Ophthalmology) Franchot Gallo, MD as Consulting Physician (Urology) Tanda Rockers, MD as Consulting Physician (Pulmonary Disease) Allyn Kenner, MD (Dermatology) Deliah Goody, PA-C as Physician Assistant (Physician Assistant) Mayli Covington X, NP as Nurse Practitioner (Internal Medicine) Ngetich, Nelda Bucks, NP as Nurse Practitioner (Family Medicine)  Extended Emergency Contact Information Primary Emergency Contact: Ringstad,[name] Address: [address], [place] Phone: [phone] Mobile Phone: [phone] Relation: Spouse Secondary Emergency Contact: [name],[place]  [place] Mobile Phone: [phone] Relation: Daughter  Code Status:  FULL CODE Goals of care: Advanced Directive information Advanced Directives 07/16/2019  Does Patient Have a Medical Advance Directive? Yes  Type of Advance Directive [place]  Does patient want to make changes to medical advance directive? No - Patient declined  Copy of [place] in Chart? Yes - validated most recent copy scanned in chart (See row information)  Would patient like information on creating a medical advance directive? -  Pre-existing out of facility DNR order (yellow form or pink MOST form) -     Chief Complaint  Patient presents with  . Medical Management of Chronic Issues    Routine Visit    HPI:  Pt is a 86 y.o. male seen today for medical management of chronic diseases.      Hx of V tach, s/p PPP, heart rate is in control, on Amiodarone 182m qd. His mood is stable, on Duloxetine 285m qd. CHF/edema BLE, stable, taking Torsemide 2024md. OA multiple sites, chronic lower left back pain, takes Tylenol 500m52md, Robaxin 250mg2m, . Urinary retention, Foley. GERD, stable, on Pantoprazole. Orthostatic hypotension, stable, on Droxidopa 100mg 80m  Midodrine 5mg ti63m Constipation, stable, on Trulance 3mg qd,21mnokot S II bid, MiraLax prn. Hypothyroidism, on Levothyroxine 100mcg qd35mS, stable, on Ropinirole 0.5mg bid/p56m Past Medical History:  Diagnosis Date  . Arthritis    "minor, back and sometimes knees" (12/15/2012)  . Bradycardia    AFib/SSS s/p St Jude PPM 04/12/2008  . CAD (coronary artery disease) 12/30/2014   CABG (LIMA-LAD, SVG-RCA, SVG-OM in 1996).  07/2009 BMS to SVG-RCA. Cath in 04/2010 with patent stents   . Cardiomyopathy, ischemic 08/25/2012  . CHF (congestive heart failure) (HCC)   . CAtholnic knee pain 12/03/2014  . Combined congestive systolic and diastolic heart failure (HCC) 12/29[place]16   Hx EF 41%. BNP 96.8 02/21/15 Torsemide 04/06/15 Na 142, K 4.6, Bun 16, creat 0.89 04/20/14 BNP 111.7, Na 142, K 4.6, Bun 16, creat 0.9   . Depression with anxiety 02/02/2015   02/21/15 Hgb A1c 5.8 03/10/15 MMSE 30/30   . Dizziness, after diuretic asscoiated with hypotension and responded to fluid bolus 06/05/2011   04/28/15 [place] carotid[place]+L normal bilateral arterial velocities.    . Dyspnea Marland Kitchen/08/2014   Followed in Pulmonary clinic/ [facility]/ [place]  - 08/11/2014  Walked RA x 1 laps @ 185 ft each stopped due to fatigue/off balance/ slow pace/  no sob or desat  - PFT's  09/26/2014  FEV1 2.26 (85 % ) ratio 76  p no % improvement from saba with DLCO  67 % corrects to 93 % for alv volume      Since prev study 08/04/13 minimal change lung vol or dlco    . Embolic cerebral infarction ([place]) 12/06/2015  . Exertional shortness of breath    "sometimes walking" (12/15/2012)  . GERD (gastroesophageal reflux disease)   . Gout 02/09/2015  . Heart murmur    "just told I had one today" (12/15/2012)  .  Hiatal hernia   . Hyperlipidemia   . Hypertension   . Hypothyroid   . Influenza A 03/10/2016  . Insomnia   . Melanoma of back ([place]) 1976  . Myocardial infarction [facility]) 1996; 2011   "both silent" (12/15/2012)  . Nonrheumatic aortic valve stenosis   . Orthostatic hypotension   . Osteoporosis, senile   . Pacemaker   . RBBB   . Restless leg 02/02/2015  . Right leg weakness 12/06/2015  . [facility] spotted fever   . S/P CABG x 4   . Sick sinus syndrome ([place]) 01/31/2014  . Sustained ventricular tachycardia ([place]) 07/27/2014  . Urinary retention 12/30/2014   has a cathrater   Past Surgical History:  Procedure Laterality Date  . CARDIAC CATHETERIZATION  04/2010   LIMA to LAD patent,SVG to OM patent,no in-stnet restenosis RCA  . CATARACT EXTRACTION W/ INTRAOCULAR LENS  IMPLANT, BILATERAL Bilateral 2012  . CORONARY ANGIOPLASTY WITH STENT PLACEMENT  07/2009   bare metal stent to SVG to the RCA  . CORONARY ARTERY BYPASS GRAFT  1996   LIMA to LAD,SVG to RCA & SVG to OM  . INSERT / REPLACE / REMOVE PACEMAKER  2010  . MELANOMA EXCISION  05/1974 X2   "taken off my back" (12/15/2012)  . NM MYOVIEW LTD  06/2011   low risk  . PPM GENERATOR CHANGEOUT N/A 01/22/2017   Procedure: PPM GENERATOR CHANGEOUT;  Surgeon: [name], MD;  Location: [place];  Service: Cardiovascular;  Laterality: N/A;  . TONSILLECTOMY  1938  . TRANSURETHRAL RESECTION OF PROSTATE  1986  . US ECHOCARDIOGRAPHY  07/11/2009   EF 45-50%    Allergies  Allergen Reactions  . Altace [Ramipril] Cough  . Crestor [Rosuvastatin Calcium] Rash  . Penicillins Rash and Other (See Comments)    Has patient had a PCN reaction causing immediate rash, facial/tongue/throat swelling, SOB or lightheadedness with hypotension: Yes Has patient had a PCN reaction causing severe rash involving mucus membranes or skin necrosis: No Has patient had a PCN reaction that required hospitalization No Has patient had a PCN reaction  occurring within the last 10 years: No If all of the above answers are "NO", then may proceed with Cephalosporin use.   . Sulfa Antibiotics Rash  . Sulfamethoxazole-Trimethoprim Rash    Allergies as of 07/16/2019      Reactions   Altace [ramipril] Cough   Crestor [rosuvastatin Calcium] Rash   Penicillins Rash, Other (See Comments)   Has patient had a PCN reaction causing immediate rash, facial/tongue/throat swelling, SOB or lightheadedness with hypotension: Yes Has patient had a PCN reaction causing severe rash involving mucus membranes or skin necrosis: No Has patient had a PCN reaction that required hospitalization No Has patient had a PCN reaction occurring within the last 10 years: No If all of the above answers are "NO", then may proceed with Cephalosporin use.   Sulfa Antibiotics Rash   Sulfamethoxazole-trimethoprim Rash      Medication List       Accurate  as of July 16, 2019  4:40 PM. If you have any questions, ask your nurse or doctor.        STOP taking these medications   fluorouracil 5 % cream Commonly known as: EFUDEX Stopped by: Darriona Dehaas X Alvin Rubano, NP     TAKE these medications   acetaminophen 500 MG tablet Commonly known as: TYLENOL Take 500 mg by mouth 3 (three) times daily.   amiodarone 100 MG tablet Commonly known as: PACERONE Take 1 tablet (100 mg total) by mouth daily.   aspirin EC 81 MG tablet Take 1 tablet (81 mg total) by mouth daily.   clotrimazole-betamethasone cream Commonly known as: LOTRISONE Apply 1 application topically daily as needed.   DULoxetine 20 MG capsule Commonly known as: CYMBALTA Take 20 mg by mouth daily. For anxiety,insomnia and back pain   feeding supplement (BOOST BREEZE) Liqd Take by mouth 2 (two) times daily.   feeding supplement (PRO-STAT SUGAR FREE 64) Liqd Take 30 mLs by mouth daily.   fluticasone 0.05 % cream Commonly known as: CUTIVATE Apply topically 2 (two) times daily as needed. Apply small amount on  face and  back also can apply as needed   fluticasone 50 MCG/ACT nasal spray Commonly known as: FLONASE Place 1 spray into both nostrils 2 (two) times daily.   guaiFENesin 100 MG/5ML Soln Commonly known as: ROBITUSSIN Take 5 mLs (100 mg total) by mouth every 4 (four) hours as needed for cough or to loosen phlegm.   ketoconazole 2 % cream Commonly known as: NIZORAL Apply 1 application topically as needed for irritation. Apply small amount behind ears and in creases beside nose BID x 7 days. MAY REPEAT PRN. (Family supplied).   levothyroxine 100 MCG tablet Commonly known as: SYNTHROID Take 100 mcg by mouth daily before breakfast.   meclizine 25 MG tablet Commonly known as: ANTIVERT Take 1 tablet (25 mg total) by mouth 2 (two) times daily as needed for dizziness.   melatonin 3 MG Tabs tablet Take 3 mg by mouth at bedtime.   methocarbamol 500 MG tablet Commonly known as: ROBAXIN Take 250 mg by mouth 2 (two) times daily. As needed   midodrine 5 MG tablet Commonly known as: PROAMATINE Take 5 mg by mouth 3 (three) times daily with meals. Notify provider if BP is < 90/60.   [name] 100 MG Caps Generic drug: Droxidopa TAKE 1 CAPSULE BY MOUTH THREE TIMES DAILY. TAKE LAST DOSE AT LEAST 3 HOURS BEFORE BEDTIME.   ORAJEL DRY MOUTH MT Use as directed in the mouth or throat. Swish and spit 10 ml 4 x day as needed for sore gums. Continue until dentures fit and/or next dental visit. Four Times A Day - PRN   pantoprazole 40 MG tablet Commonly known as: PROTONIX Take 40 mg by mouth daily.   Plecanatide 3 MG Tabs Commonly known as: Trulance Take 3 mg by mouth daily.   polyethylene glycol 17 g packet Commonly known as: MIRALAX / GLYCOLAX Take 17 g by mouth daily as needed. Take 1-2 capfuls in 8 oz of H2O   pravastatin 40 MG tablet Commonly known as: PRAVACHOL Take 1 tablet (40 mg total) by mouth at bedtime.   rOPINIRole 0.25 MG tablet Commonly known as: REQUIP Take 0.5 mg by mouth 2 (two)  times daily.   rOPINIRole 0.5 MG tablet Commonly known as: REQUIP Take 0.5 mg by mouth daily as needed.   sennosides-docusate sodium 8.6-50 MG tablet Commonly known as: SENOKOT-S Take 2 tablets by mouth  2 (two) times daily.   torsemide 10 MG tablet Commonly known as: DEMADEX Take 20 mg by mouth daily. 20 mg once a day   Vitamin D3 125 MCG (5000 UT) Caps Take 5,000 Units by mouth every Monday.       Review of Systems  Constitutional: Negative for fatigue, fever and unexpected weight change.       Weight loss #4Ibs in the past month  HENT: Positive for hearing loss. Negative for congestion and voice change.   Eyes: Negative for visual disturbance.  Respiratory: Positive for cough and shortness of breath.        Chronic DOE, cough  Cardiovascular: Positive for leg swelling. Negative for chest pain and palpitations.  Gastrointestinal: Negative for abdominal pain and constipation.  Genitourinary: Positive for difficulty urinating. Negative for dysuria and hematuria.       Foley  Musculoskeletal: Positive for arthralgias, back pain and gait problem.       Right shoulder limited over head ROM with pain.  Positional lower back pain is chronic  Skin: Negative for color change.  Neurological: Negative for dizziness, speech difficulty and weakness.       Memory lapses.   Psychiatric/Behavioral: Negative for agitation, behavioral problems and sleep disturbance.    Immunization History  Administered Date(s) Administered  . Influenza, High Dose Seasonal PF 11/01/2014, 11/13/2016, 11/19/2018, 11/19/2018  . Influenza,inj,Quad PF,6+ Mos 11/24/2017  . Influenza,inj,quad, With Preservative 11/17/2017  . Influenza-Unspecified 12/05/2013, 11/05/2014, 11/16/2015  . Moderna SARS-COVID-2 Vaccination 02/08/2019, 03/08/2019  . PPD Test 03/07/2014  . Pneumococcal Conjugate-13 10/05/2009  . Pneumococcal Polysaccharide-23 11/05/2005  . Pneumococcal-Unspecified 10/05/2009  . Tdap 08/02/2014  .  Zoster 11/05/2005   Pertinent  Health Maintenance Due  Topic Date Due  . INFLUENZA VACCINE  09/05/2019  . DEXA SCAN  12/14/2024  . PNA vac Low Risk Adult  Completed   Fall Risk  10/09/2017 10/02/2016 10/30/2015 09/05/2015 02/02/2015  Falls in the past year? No No No No No  Risk for fall due to : - - Impaired balance/gait - -   Functional Status Survey:    Vitals:   07/16/19 1238  BP: 116/74  Pulse: 71  Resp: 18  Temp: 97.8 F (36.6 C)  SpO2: 97%  Weight: 177 lb 8 oz (80.5 kg)  Height: '5\' 10"'  (1.778 m)   Body mass index is 25.47 kg/m. Physical Exam Vitals and nursing note reviewed.  Constitutional:      Appearance: Normal appearance.  HENT:     Head: Normocephalic and atraumatic.     Mouth/Throat:     Mouth: Mucous membranes are moist.  Eyes:     Extraocular Movements: Extraocular movements intact.     Conjunctiva/sclera: Conjunctivae normal.     Pupils: Pupils are equal, round, and reactive to light.  Cardiovascular:     Rate and Rhythm: Normal rate and regular rhythm.     Heart sounds: Murmur heard.      Comments: Left upper chest pace maker.  Pulmonary:     Breath sounds: No rales.  Abdominal:     General: Bowel sounds are normal.     Palpations: Abdomen is soft.     Tenderness: There is no abdominal tenderness.  Genitourinary:    Comments: Foley.  Musculoskeletal:     Cervical back: Normal range of motion and neck supple.     Right lower leg: Edema present.     Left lower leg: Edema present.     Comments: Trace edema BLE. Reduced over  head ROM of the right shoulder.  Positional lower back pain remains no change.   Skin:    General: Skin is warm and dry.     Findings: Rash present. No bruising.     Comments: Seborrheic dermatitis face  Neurological:     General: No focal deficit present.     Mental Status: He is alert. Mental status is at baseline.     Gait: Gait abnormal.     Comments: Oriented to person, place.   Psychiatric:        Mood and Affect:  Mood normal.        Behavior: Behavior normal.        Thought Content: Thought content normal.     Labs reviewed: Recent Labs    09/23/18 0000 03/15/19 0000  NA 141 139  K 4.1 3.9  CL  --  100  CO2  --  31*  BUN 18 20  CREATININE 0.7 0.8  CALCIUM  --  8.9   Recent Labs    09/23/18 0000 03/15/19 0000  AST 23 14  ALT 14 8*  ALKPHOS 64 75  ALBUMIN  --  3.5   Recent Labs    09/23/18 0000 03/15/19 0000  WBC 6.2 6.4  HGB 12.7* 12.3*  HCT 38* 36*  PLT 234 228   Lab Results  Component Value Date   TSH 1.71 03/15/2019   Lab Results  Component Value Date   HGBA1C 5.6 12/07/2015   Lab Results  Component Value Date   CHOL 93 03/30/2018   HDL 35 03/30/2018   LDLCALC 37 03/30/2018   TRIG 127 03/30/2018   CHOLHDL 2.6 05/05/2017    Significant Diagnostic Results in last 30 days:  No results found.  Assessment/Plan Combined congestive systolic and diastolic heart failure (HCC) Chronic DOE, cough, trace edema BLE, continue Torsemide 64m qd.   Neurogenic orthostatic hypotension (HCC) Stable, continue Droxidopa 1033mtid, Midodrine 47m53mid.   Ventricular tachycardia (HCC) S/p PPP, heart rate is in control, continue Amiodarone.   Chronic constipation Stable, continue Trulance 3mg39m, Senokot S II bid, prn MiraLax.   GERD (gastroesophageal reflux disease) Stable, continue Pantoprazole.   Hypothyroidism Stable, TSH 1.71 03/15/19, continue Levothyroxine 100mc24m. Will update TSH, CMP/eGFR, CBC/diff in 8 weeks.   Osteoarthritis Multiple sites, left lower back, continue Tylenol 500mg 73m  Robaxin 250mg b26m  Urinary retention Chronic Foley  Depression, recurrent (HCC) His mood is stable, continue Duloxetine.   RLS (restless legs syndrome) Stable, continue Ropinirole.   Seborrheic dermatitis Nasolabial, facial, under Dermatology tx. Observe.       Family/ staff Communication: plan of care reviewed with the patient and charge nurse.   Labs/tests  ordered:  CBC/diff, CMP/eGFR, TSH 8 weeks.   Time spend 25 minutes.

## 2019-07-16 NOTE — Assessment & Plan Note (Addendum)
Stable, TSH 1.71 03/15/19, continue Levothyroxine 118mg qd. Will update TSH, CMP/eGFR, CBC/diff in 8 weeks.

## 2019-07-16 NOTE — Assessment & Plan Note (Signed)
Stable, continue Trulance 3mg  qd, Senokot S II bid, prn MiraLax.

## 2019-07-16 NOTE — Assessment & Plan Note (Signed)
Chronic Foley.  

## 2019-07-16 NOTE — Assessment & Plan Note (Signed)
His mood is stable, continue Duloxetine.  

## 2019-07-16 NOTE — Assessment & Plan Note (Signed)
Stable, continue Droxidopa 100mg  tid, Midodrine 5mg  tid.

## 2019-07-19 ENCOUNTER — Non-Acute Institutional Stay (SKILLED_NURSING_FACILITY): Payer: Medicare PPO | Admitting: Internal Medicine

## 2019-07-19 ENCOUNTER — Encounter: Payer: Self-pay | Admitting: Internal Medicine

## 2019-07-19 DIAGNOSIS — R21 Rash and other nonspecific skin eruption: Secondary | ICD-10-CM

## 2019-07-19 DIAGNOSIS — I5040 Unspecified combined systolic (congestive) and diastolic (congestive) heart failure: Secondary | ICD-10-CM | POA: Diagnosis not present

## 2019-07-19 DIAGNOSIS — C4490 Unspecified malignant neoplasm of skin, unspecified: Secondary | ICD-10-CM

## 2019-07-19 DIAGNOSIS — I472 Ventricular tachycardia, unspecified: Secondary | ICD-10-CM

## 2019-07-19 DIAGNOSIS — G903 Multi-system degeneration of the autonomic nervous system: Secondary | ICD-10-CM

## 2019-07-19 NOTE — Progress Notes (Signed)
Location: [employer] features editor of Service:  SNF (31)  Provider:   Code Status:  Goals of Care:  Advanced Directives 07/16/2019  Does Patient Have a Medical Advance Directive? Yes  Type of Advance Directive [place]  Does patient want to make changes to medical advance directive? No - Patient declined  Copy of [place] in Chart? Yes - validated most recent copy scanned in chart (See row information)  Would patient like information on creating a medical advance directive? -  Pre-existing out of facility DNR order (yellow form or pink MOST form) -     Chief Complaint  Patient presents with  . Acute Visit    HPI: Patient is a 86 y.o. male seen today for an acute visit for Facial Rash  He has h/o CAD s/p Bypass surgery with Extensive Inferior Wall Scarring, Ischemic Cardiomyopathy withsystolic heart failureEF of 35-40%Moderate AS,history of ventricular tachycardia stable on amiodarone, sinus node dysfunction with dual-chamber PPP, severe orthostatic hypotension neurogenic,history of compression fractures, history of ischemic stroke in the past on dual therapy.,History of chronic indwelling Foley catheter  Patient was seen today at request of his wife and nurses. Patient had skin cancer and was prescribed topical treatment.  For his scalp and hand.  But patient broke up in a red maculopapular rash on his face.  I had changed his Cutivate to twice daily for a week.  That has helped his redness but now he has crusty white areas.  Especially around his nose above his lips and below his lips.  It is still red and tender in that area. For dermatology do think it is because of the allergic reaction to the topical treatment  Past Medical History:  Diagnosis Date  . Arthritis    "minor, back and sometimes knees" (12/15/2012)  . Bradycardia    AFib/SSS s/p [employer] Jude PPM 04/12/2008  . CAD (coronary artery disease) 12/30/2014   CABG (LIMA-LAD,  SVG-RCA, SVG-OM in 1996).  07/2009 BMS to SVG-RCA. Cath in 04/2010 with patent stents   . Cardiomyopathy, ischemic 08/25/2012  . CHF (congestive heart failure) ([place])   . Chronic knee pain 12/03/2014  . Combined congestive systolic and diastolic heart failure ([place]) 02/02/2015   Hx EF 41%. BNP 96.8 02/21/15 Torsemide 04/06/15 Na 142, K 4.6, Bun 16, creat 0.89 04/20/14 BNP 111.7, Na 142, K 4.6, Bun 16, creat 0.9   . Depression with anxiety 02/02/2015   02/21/15 Hgb A1c 5.8 03/10/15 MMSE 30/30   . Dizziness, after diuretic asscoiated with hypotension and responded to fluid bolus 06/05/2011   04/28/15 US carotid R+L normal bilateral arterial velocities.    Marland Kitchen Dyspnea 08/11/2014   Followed in Pulmonary clinic/ [facility]/ [place]  - 08/11/2014  Walked RA x 1 laps @ 185 ft each stopped due to fatigue/off balance/ slow pace/  no sob or desat  - PFT's  09/26/2014  FEV1 2.26 (85 % ) ratio 76  p no % improvement from saba with DLCO  67 % corrects to 93 % for alv volume      Since prev study 08/04/13 minimal change lung vol or dlco    . Embolic cerebral infarction ([place]) 12/06/2015  . Exertional shortness of breath    "sometimes walking" (12/15/2012)  . GERD (gastroesophageal reflux disease)   . Gout 02/09/2015  . Heart murmur    "just told I had one today" (12/15/2012)  . Hiatal hernia   . Hyperlipidemia   .  Hypertension   . Hypothyroid   . Influenza A 03/10/2016  . Insomnia   . Melanoma of back ([place]) 1976  . Myocardial infarction [facility]) 1996; 2011   "both silent" (12/15/2012)  . Nonrheumatic aortic valve stenosis   . Orthostatic hypotension   . Osteoporosis, senile   . Pacemaker   . RBBB   . Restless leg 02/02/2015  . Right leg weakness 12/06/2015  . [facility] spotted fever   . S/P CABG x 4   . Sick sinus syndrome ([place]) 01/31/2014  . Sustained ventricular tachycardia ([place]) 07/27/2014  . Urinary retention 12/30/2014   has a cathrater    Past Surgical History:  Procedure Laterality Date  . CARDIAC  CATHETERIZATION  04/2010   LIMA to LAD patent,SVG to OM patent,no in-stnet restenosis RCA  . CATARACT EXTRACTION W/ INTRAOCULAR LENS  IMPLANT, BILATERAL Bilateral 2012  . CORONARY ANGIOPLASTY WITH STENT PLACEMENT  07/2009   bare metal stent to SVG to the RCA  . CORONARY ARTERY BYPASS GRAFT  1996   LIMA to LAD,SVG to RCA & SVG to OM  . INSERT / REPLACE / REMOVE PACEMAKER  2010  . MELANOMA EXCISION  05/1974 X2   "taken off my back" (12/15/2012)  . NM MYOVIEW LTD  06/2011   low risk  . PPM GENERATOR CHANGEOUT N/A 01/22/2017   Procedure: PPM GENERATOR CHANGEOUT;  Surgeon: [name], MD;  Location: [facility];  Service: Cardiovascular;  Laterality: N/A;  . TONSILLECTOMY  1938  . TRANSURETHRAL RESECTION OF PROSTATE  1986  . US ECHOCARDIOGRAPHY  07/11/2009   EF 45-50%    Allergies  Allergen Reactions  . Altace [Ramipril] Cough  . Crestor [Rosuvastatin Calcium] Rash  . Penicillins Rash and Other (See Comments)    Has patient had a PCN reaction causing immediate rash, facial/tongue/throat swelling, SOB or lightheadedness with hypotension: Yes Has patient had a PCN reaction causing severe rash involving mucus membranes or skin necrosis: No Has patient had a PCN reaction that required hospitalization No Has patient had a PCN reaction occurring within the last 10 years: No If all of the above answers are "NO", then may proceed with Cephalosporin use.   . Sulfa Antibiotics Rash  . Sulfamethoxazole-Trimethoprim Rash    Outpatient Encounter Medications as of 07/19/2019  Medication Sig  . mineral oil-hydrophilic petrolatum (AQUAPHOR) ointment Apply 1 application topically 2 (two) times daily as needed for dry skin. To face  . acetaminophen (TYLENOL) 500 MG tablet Take 500 mg by mouth 3 (three) times daily.   . Amino Acids-Protein Hydrolys (FEEDING SUPPLEMENT, PRO-STAT SUGAR FREE 64,) LIQD Take 30 mLs by mouth daily.   Marland Kitchen amiodarone (PACERONE) 100 MG tablet Take 1 tablet (100 mg total)  by mouth daily.  Marland Kitchen aspirin EC 81 MG tablet Take 1 tablet (81 mg total) by mouth daily.  . Cholecalciferol (VITAMIN D3) 5000 UNITS CAPS Take 5,000 Units by mouth every Monday.   . clotrimazole-betamethasone (LOTRISONE) cream Apply 1 application topically daily as needed.  . Droxidopa ([name]) 100 MG CAPS TAKE 1 CAPSULE BY MOUTH THREE TIMES DAILY. TAKE LAST DOSE AT LEAST 3 HOURS BEFORE BEDTIME.  . DULoxetine (CYMBALTA) 20 MG capsule Take 20 mg by mouth daily. For anxiety,insomnia and back pain  . fluticasone (CUTIVATE) 0.05 % cream Apply topically 2 (two) times daily as needed. Apply small amount on  face and back also can apply as needed  . fluticasone (FLONASE) 50 MCG/ACT nasal spray Place 1 spray into both nostrils 2 (  two) times daily.  . Glycerin (ORAJEL DRY MOUTH MT) Use as directed in the mouth or throat. Swish and spit 10 ml 4 x day as needed for sore gums. Continue until dentures fit and/or next dental visit. Four Times A Day - PRN  . guaiFENesin (ROBITUSSIN) 100 MG/5ML SOLN Take 5 mLs (100 mg total) by mouth every 4 (four) hours as needed for cough or to loosen phlegm.  Marland Kitchen ketoconazole (NIZORAL) 2 % cream Apply 1 application topically as needed for irritation. Apply small amount behind ears and in creases beside nose BID x 7 days. MAY REPEAT PRN. (Family supplied).  Marland Kitchen levothyroxine (SYNTHROID, LEVOTHROID) 100 MCG tablet Take 100 mcg by mouth daily before breakfast.  . meclizine (ANTIVERT) 25 MG tablet Take 1 tablet (25 mg total) by mouth 2 (two) times daily as needed for dizziness.  . Melatonin 3 MG TABS Take 3 mg by mouth at bedtime.  . methocarbamol (ROBAXIN) 500 MG tablet Take 250 mg by mouth 2 (two) times daily. As needed  . midodrine (PROAMATINE) 5 MG tablet Take 5 mg by mouth 3 (three) times daily with meals. Notify provider if BP is < 90/60.  Marland Kitchen Nutritional Supplements (FEEDING SUPPLEMENT, BOOST BREEZE,) LIQD Take by mouth 2 (two) times daily.  . pantoprazole (PROTONIX) 40 MG tablet  Take 40 mg by mouth daily.  Marland Kitchen Plecanatide (TRULANCE) 3 MG TABS Take 3 mg by mouth daily.  . polyethylene glycol (MIRALAX / GLYCOLAX) 17 g packet Take 17 g by mouth daily as needed. Take 1-2 capfuls in 8 oz of H2O  . pravastatin (PRAVACHOL) 40 MG tablet Take 1 tablet (40 mg total) by mouth at bedtime.  Marland Kitchen rOPINIRole (REQUIP) 0.25 MG tablet Take 0.5 mg by mouth 2 (two) times daily.  Marland Kitchen rOPINIRole (REQUIP) 0.5 MG tablet Take 0.5 mg by mouth daily as needed.  . sennosides-docusate sodium (SENOKOT-S) 8.6-50 MG tablet Take 2 tablets by mouth 2 (two) times daily.   Marland Kitchen torsemide (DEMADEX) 10 MG tablet Take 20 mg by mouth daily. 20 mg once a day   No facility-administered encounter medications on file as of 07/19/2019.    Review of Systems:  Review of Systems  Constitutional: Negative.   HENT: Negative.   Respiratory: Negative.   Cardiovascular: Positive for leg swelling.  Gastrointestinal: Positive for constipation.  Musculoskeletal: Positive for gait problem.  Skin: Positive for color change and rash.  Neurological: Positive for weakness.  Psychiatric/Behavioral: Negative.     Health Maintenance  Topic Date Due  . TETANUS/TDAP  08/01/2026 (Originally 08/01/2024)  . INFLUENZA VACCINE  09/05/2019  . DEXA SCAN  12/14/2024  . COVID-19 Vaccine  Completed  . PNA vac Low Risk Adult  Completed    Physical Exam: Vitals:   07/19/19 2220  BP: 123/73  Pulse: 71  Temp: 98.7 F (37.1 C)   There is no height or weight on file to calculate BMI. Physical Exam  Constitutional: Oriented to person, place, and time. Well-developed and well-nourished.  HENT:  Head: Normocephalic.  Patient has maculopapular rash throughout his face. Crusty lips around the lower part of his chin.  And some redness below the nose and on his chin Mouth/Throat: Oropharynx is clear and moist.  Eyes: Pupils are equal, round, and reactive to light.  Neck: Neck supple.  Cardiovascular: Normal rate and normal heart sounds.    .Murmur present.  Pulmonary/Chest: Effort normal and breath sounds normal. No respiratory distress. No wheezes. She has no rales.  Abdominal: Soft. Bowel sounds  are normal. No distension. There is no tenderness. There is no rebound.  Musculoskeletal Edema Present Lymphadenopathy: none Neurological: Alert and oriented to person, place, and time.  Skin: Skin is warm and dry.  Psychiatric: Normal mood and affect. Behavior is normal. Thought content normal.    Labs reviewed: Basic Metabolic Panel: Recent Labs    08/27/18 0000 09/23/18 0000 03/15/19 0000  NA  --  141 139  K  --  4.1 3.9  CL  --   --  100  CO2  --   --  31*  BUN  --  18 20  CREATININE  --  0.7 0.8  CALCIUM  --   --  8.9  TSH 2.03  --  1.71   Liver Function Tests: Recent Labs    09/23/18 0000 03/15/19 0000  AST 23 14  ALT 14 8*  ALKPHOS 64 75  ALBUMIN  --  3.5   No results for input(s): LIPASE, AMYLASE in the last 8760 hours. No results for input(s): AMMONIA in the last 8760 hours. CBC: Recent Labs    09/23/18 0000 03/15/19 0000  WBC 6.2 6.4  HGB 12.7* 12.3*  HCT 38* 36*  PLT 234 228   Lipid Panel: No results for input(s): CHOL, HDL, LDLCALC, TRIG, CHOLHDL, LDLDIRECT in the last 8760 hours. Lab Results  Component Value Date   HGBA1C 5.6 12/07/2015    Procedures since last visit: No results found.  Assessment/Plan Facial rash We will start him on prednisone 10 mg daily for 5 days I am worried about the redness around his nose and chin If it gets worse we consider antibiotics Skin cancer Is going to follow-up with dermatology in 2 more weeks  Combined systolic and diastolic congestive heart failure,  Stable on Demadex Neurogenic orthostatic hypotension (HCC) Continue midodrine and droxidopa       Labs/tests ordered:  * No order type specified * Next appt:  Visit date not found

## 2019-07-26 ENCOUNTER — Encounter: Payer: Self-pay | Admitting: Internal Medicine

## 2019-07-26 ENCOUNTER — Non-Acute Institutional Stay (SKILLED_NURSING_FACILITY): Payer: Medicare PPO | Admitting: Internal Medicine

## 2019-07-26 DIAGNOSIS — R21 Rash and other nonspecific skin eruption: Secondary | ICD-10-CM | POA: Diagnosis not present

## 2019-07-26 DIAGNOSIS — C4490 Unspecified malignant neoplasm of skin, unspecified: Secondary | ICD-10-CM | POA: Diagnosis not present

## 2019-07-26 DIAGNOSIS — I5040 Unspecified combined systolic (congestive) and diastolic (congestive) heart failure: Secondary | ICD-10-CM

## 2019-07-26 DIAGNOSIS — K5909 Other constipation: Secondary | ICD-10-CM

## 2019-07-26 DIAGNOSIS — G903 Multi-system degeneration of the autonomic nervous system: Secondary | ICD-10-CM

## 2019-07-26 NOTE — Progress Notes (Signed)
Location:    [facility] Room Number: 29 Place of Service:  SNF [phone]) Provider:  Veleta Miners MD   [name], MD  Patient Care Team: [name], MD as PCP - General (Internal Medicine) [name], MD as PCP - Cardiology (Cardiology) [name], MD as Attending Physician (Cardiology) Vevelyn Royals, MD as Consulting Physician (Ophthalmology) Franchot Gallo, MD as Consulting Physician (Urology) Tanda Rockers, MD as Consulting Physician (Pulmonary Disease) Allyn Kenner, MD (Dermatology) Deliah Goody, PA-C as Physician Assistant (Physician Assistant) Mast, Man X, NP as Nurse Practitioner (Internal Medicine) Ngetich, Nelda Bucks, NP as Nurse Practitioner (Family Medicine)  Extended Emergency Contact Information Primary Emergency Contact: Adam [name],Adam [name] Address: [address], [place] Phone: [phone] Mobile Phone: [phone] Relation: Spouse Secondary Emergency Contact: Adam [name],Welch  [place] Mobile Phone: [phone] Relation: Daughter  Code Status:  Full Code Goals of care: Advanced Directive information Advanced Directives 07/16/2019  Does Patient Have a Medical Advance Directive? Yes  Type of Advance Directive [place]  Does patient want to make changes to medical advance directive? No - Patient declined  Copy of [place] in Chart? Yes - validated most recent copy scanned in chart (See row information)  Would patient like information on creating a medical advance directive? -  Pre-existing out of facility DNR order (yellow form or pink MOST form) -     Chief Complaint  Patient presents with  . Acute Visit    Face rash    HPI:  Pt is a 86 y.o. male seen today for an acute visit for Facial Rash  He has h/o CAD [name]/p Bypass surgery with Extensive Inferior Wall Scarring, Ischemic Cardiomyopathy withsystolic heart  failureEF of 35-40%Moderate AS,history of ventricular tachycardia stable on amiodarone, sinus node dysfunction with dual-chamber PPP, severe orthostatic hypotension neurogenic,history of compression fractures, history of ischemic stroke in the past on dual therapy.,History of chronic indwelling Foley catheter  Patient had skin cancer and was prescribed topical treatment for his scalp and hand.  But patient broke up in a red maculopapular rash on his face. Rash much better but still has redness and Flaky. Complaining of Itchiness and Discomfort Unable to take to Dermatology office due to Transport issue In Facility NP for dermatology would be there in 1 week Dermatology do think it is because of the allergic reaction to the topical treatment  Past Medical History:  Diagnosis Date  . Arthritis    "minor, back and sometimes knees" (12/15/2012)  . Bradycardia    AFib/SSS [name]/p St Jude PPM 04/12/2008  . CAD (coronary artery disease) 12/30/2014   CABG (LIMA-LAD, SVG-RCA, SVG-OM in 1996).  07/2009 BMS to SVG-RCA. Cath in 04/2010 with patent stents   . Cardiomyopathy, ischemic 08/25/2012  . CHF (congestive heart failure) ([place])   . Chronic knee pain 12/03/2014  . Combined congestive systolic and diastolic heart failure ([place]) 02/02/2015   Hx EF 41%. BNP 96.8 02/21/15 Torsemide 04/06/15 Na 142, K 4.6, Bun 16, creat 0.89 04/20/14 BNP 111.7, Na 142, K 4.6, Bun 16, creat 0.9   . Depression with anxiety 02/02/2015   02/21/15 Hgb A1c 5.8 03/10/15 MMSE 30/30   . Dizziness, after diuretic asscoiated with hypotension and responded to fluid bolus 06/05/2011   04/28/15 US carotid R+L normal bilateral arterial velocities.    [place] Kitchen Dyspnea 08/11/2014   Followed in Pulmonary clinic/ [facility]  - 08/11/2014  Walked RA x 1 laps @ 185 ft each stopped due to fatigue/off balance/ slow pace/  no sob or desat  - PFT'[name]  09/26/2014  FEV1 2.26 (85 % ) ratio 76  p no % improvement from saba with DLCO  67 % corrects to 93 % for  alv volume      Since prev study 08/04/13 minimal change lung vol or dlco    . Embolic cerebral infarction ([place]) 12/06/2015  . Exertional shortness of breath    "sometimes walking" (12/15/2012)  . GERD (gastroesophageal reflux disease)   . Gout 02/09/2015  . Heart murmur    "just told I had one today" (12/15/2012)  . Hiatal hernia   . Hyperlipidemia   . Hypertension   . Hypothyroid   . Influenza A 03/10/2016  . Insomnia   . Melanoma of back ([place]) 1976  . Myocardial infarction [facility]) 1996; 2011   "both silent" (12/15/2012)  . Nonrheumatic aortic valve stenosis   . Orthostatic hypotension   . Osteoporosis, senile   . Pacemaker   . RBBB   . Restless leg 02/02/2015  . Right leg weakness 12/06/2015  . [facility] spotted fever   . [name]/P CABG x 4   . Sick sinus syndrome ([place]) 01/31/2014  . Sustained ventricular tachycardia ([place]) 07/27/2014  . Urinary retention 12/30/2014   has a cathrater   Past Surgical History:  Procedure Laterality Date  . CARDIAC CATHETERIZATION  04/2010   LIMA to LAD patent,SVG to OM patent,no in-stnet restenosis RCA  . CATARACT EXTRACTION W/ INTRAOCULAR LENS  IMPLANT, BILATERAL Bilateral 2012  . CORONARY ANGIOPLASTY WITH STENT PLACEMENT  07/2009   bare metal stent to SVG to the RCA  . CORONARY ARTERY BYPASS GRAFT  1996   LIMA to LAD,SVG to RCA & SVG to OM  . INSERT / REPLACE / REMOVE PACEMAKER  2010  . MELANOMA EXCISION  05/1974 X2   "taken off my back" (12/15/2012)  . NM MYOVIEW LTD  06/2011   low risk  . PPM GENERATOR CHANGEOUT N/A 01/22/2017   Procedure: PPM GENERATOR CHANGEOUT;  Surgeon: [name], MD;  Location: [facility] CV LAB;  Service: Cardiovascular;  Laterality: N/A;  . TONSILLECTOMY  1938  . TRANSURETHRAL RESECTION OF PROSTATE  1986  . US ECHOCARDIOGRAPHY  07/11/2009   EF 45-50%    Allergies  Allergen Reactions  . Altace [Ramipril] Cough  . Crestor [Rosuvastatin Calcium] Rash  . Penicillins Rash and Other (See Comments)    Has patient  had a PCN reaction causing immediate rash, facial/tongue/throat swelling, SOB or lightheadedness with hypotension: Yes Has patient had a PCN reaction causing severe rash involving mucus membranes or skin necrosis: No Has patient had a PCN reaction that required hospitalization No Has patient had a PCN reaction occurring within the last 10 years: No If all of the above answers are "NO", then may proceed with Cephalosporin use.   . Sulfa Antibiotics Rash  . Sulfamethoxazole-Trimethoprim Rash    Allergies as of 07/26/2019      Reactions   Altace [ramipril] Cough   Crestor [rosuvastatin Calcium] Rash   Penicillins Rash, Other (See Comments)   Has patient had a PCN reaction causing immediate rash, facial/tongue/throat swelling, SOB or lightheadedness with hypotension: Yes Has patient had a PCN reaction causing severe rash involving mucus membranes or skin necrosis: No Has patient had a PCN reaction that required hospitalization No Has patient had a PCN reaction occurring within the  last 10 years: No If all of the above answers are "NO", then may proceed with Cephalosporin use.   Sulfa Antibiotics Rash   Sulfamethoxazole-trimethoprim Rash      Medication List       Accurate as of July 26, 2019  2:22 PM. If you have any questions, ask your nurse or doctor.        acetaminophen 500 MG tablet Commonly known as: TYLENOL Take 500 mg by mouth 3 (three) times daily.   amiodarone 100 MG tablet Commonly known as: PACERONE Take 1 tablet (100 mg total) by mouth daily.   aspirin EC 81 MG tablet Take 1 tablet (81 mg total) by mouth daily.   clotrimazole-betamethasone cream Commonly known as: LOTRISONE Apply 1 application topically daily as needed.   DULoxetine 20 MG capsule Commonly known as: CYMBALTA Take 20 mg by mouth daily. For anxiety,insomnia and back pain   feeding supplement (BOOST BREEZE) Liqd Take by mouth 2 (two) times daily.   feeding supplement (PRO-STAT SUGAR FREE 64)  Liqd Take 30 mLs by mouth daily.   fluticasone 0.05 % cream Commonly known as: CUTIVATE Apply topically 2 (two) times daily as needed. Apply small amount on  face and back also can apply as needed   fluticasone 50 MCG/ACT nasal spray Commonly known as: FLONASE Place 1 spray into both nostrils 2 (two) times daily.   guaiFENesin 100 MG/5ML Soln Commonly known as: ROBITUSSIN Take 5 mLs (100 mg total) by mouth every 4 (four) hours as needed for cough or to loosen phlegm.   ketoconazole 2 % cream Commonly known as: NIZORAL Apply 1 application topically as needed for irritation. Apply small amount behind ears and in creases beside nose BID x 7 days. MAY REPEAT PRN. (Family supplied).   levothyroxine 100 MCG tablet Commonly known as: SYNTHROID Take 100 mcg by mouth daily before breakfast.   meclizine 25 MG tablet Commonly known as: ANTIVERT Take 1 tablet (25 mg total) by mouth 2 (two) times daily as needed for dizziness.   melatonin 3 MG Tabs tablet Take 3 mg by mouth at bedtime.   methocarbamol 500 MG tablet Commonly known as: ROBAXIN Take 250 mg by mouth 2 (two) times daily. As needed   midodrine 5 MG tablet Commonly known as: PROAMATINE Take 5 mg by mouth 3 (three) times daily with meals. Notify provider if BP is < 90/60.   mineral oil-hydrophilic petrolatum ointment Apply 1 application topically 2 (two) times daily as needed for dry skin. To face   [name] 100 MG Caps Generic drug: Droxidopa TAKE 1 CAPSULE BY MOUTH THREE TIMES DAILY. TAKE LAST DOSE AT LEAST 3 HOURS BEFORE BEDTIME.   ORAJEL DRY MOUTH MT Use as directed in the mouth or throat. Swish and spit 10 ml 4 x day as needed for sore gums. Continue until dentures fit and/or next dental visit. Four Times A Day - PRN   pantoprazole 40 MG tablet Commonly known as: PROTONIX Take 40 mg by mouth daily.   Plecanatide 3 MG Tabs Commonly known as: Trulance Take 3 mg by mouth daily.   polyethylene glycol 17 g  packet Commonly known as: MIRALAX / GLYCOLAX Take 17 g by mouth daily as needed. Take 1-2 capfuls in 8 oz of H2O   pravastatin 40 MG tablet Commonly known as: PRAVACHOL Take 1 tablet (40 mg total) by mouth at bedtime.   predniSONE 10 MG tablet Commonly known as: DELTASONE Take 10 mg by mouth daily with breakfast.  rOPINIRole 0.25 MG tablet Commonly known as: REQUIP Take 0.5 mg by mouth 2 (two) times daily.   rOPINIRole 0.5 MG tablet Commonly known as: REQUIP Take 0.5 mg by mouth daily as needed.   sennosides-docusate sodium 8.6-50 MG tablet Commonly known as: SENOKOT-[name] Take 2 tablets by mouth 2 (two) times daily.   torsemide 10 MG tablet Commonly known as: DEMADEX Take 20 mg by mouth daily. 20 mg once a day   Vitamin D3 125 MCG (5000 UT) Caps Take 5,000 Units by mouth every Monday.       Review of Systems  Constitutional: Negative.   HENT: Negative.   Respiratory: Negative.   Cardiovascular: Positive for leg swelling.  Gastrointestinal: Positive for constipation.  Genitourinary: Negative.   Musculoskeletal: Positive for gait problem.  Skin: Positive for rash.  Neurological: Positive for weakness.  Psychiatric/Behavioral: Negative.     Immunization History  Administered Date([name]) Administered  . Influenza, High Dose Seasonal PF 11/01/2014, 11/13/2016, 11/19/2018, 11/19/2018  . Influenza,inj,Quad PF,6+ Mos 11/24/2017  . Influenza,inj,quad, With Preservative 11/17/2017  . Influenza-Unspecified 12/05/2013, 11/05/2014, 11/16/2015  . Moderna SARS-COVID-2 Vaccination 02/08/2019, 03/08/2019  . PPD Test 03/07/2014  . Pneumococcal Conjugate-13 10/05/2009  . Pneumococcal Polysaccharide-23 11/05/2005  . Pneumococcal-Unspecified 10/05/2009  . Tdap 08/02/2014  . Zoster 11/05/2005   Pertinent  Health Maintenance Due  Topic Date Due  . INFLUENZA VACCINE  09/05/2019  . DEXA SCAN  12/14/2024  . PNA vac Low Risk Adult  Completed   Fall Risk  10/09/2017 10/02/2016 10/30/2015  09/05/2015 02/02/2015  Falls in the past year? No No No No No  Risk for fall due to : - - Impaired balance/gait - -   Functional Status Survey:    Vitals:   07/26/19 1409  BP: 124/80  Pulse: 69  Resp: 19  Temp: 97.6 F (36.4 C)  SpO2: 95%  Weight: 177 lb 3.2 oz (80.4 kg)  Height: 5\' 10"  (1.778 m)   Body mass index is 25.43 kg/m. Physical Exam Vitals reviewed.  Constitutional:      Appearance: Normal appearance.  HENT:     Head: Normocephalic.     Mouth/Throat:     Mouth: Mucous membranes are moist.     Pharynx: Oropharynx is clear.  Eyes:     Pupils: Pupils are equal, round, and reactive to light.  Cardiovascular:     Rate and Rhythm: Normal rate.     Pulses: Normal pulses.     Heart sounds: Murmur heard.   Pulmonary:     Effort: Pulmonary effort is normal. No respiratory distress.     Breath sounds: Normal breath sounds. No wheezing.  Abdominal:     General: Abdomen is flat. Bowel sounds are normal.     Palpations: Abdomen is soft.  Musculoskeletal:        General: Swelling present.     Cervical back: Neck supple.  Skin:    Comments: Improvement in redness in face Continues to have some crustiness around the cheeks and Chin  Neurological:     Mental Status: He is alert and oriented to person, place, and time.  Psychiatric:        Mood and Affect: Mood normal.        Thought Content: Thought content normal.     Labs reviewed: Recent Labs    09/23/18 0000 03/15/19 0000  NA 141 139  K 4.1 3.9  CL  --  100  CO2  --  31*  BUN 18 20  CREATININE 0.7  0.8  CALCIUM  --  8.9   Recent Labs    09/23/18 0000 03/15/19 0000  AST 23 14  ALT 14 8*  ALKPHOS 64 75  ALBUMIN  --  3.5   Recent Labs    09/23/18 0000 03/15/19 0000  WBC 6.2 6.4  HGB 12.7* 12.3*  HCT 38* 36*  PLT 234 228   Lab Results  Component Value Date   TSH 1.71 03/15/2019   Lab Results  Component Value Date   HGBA1C 5.6 12/07/2015   Lab Results  Component Value Date   CHOL 93  03/30/2018   HDL 35 03/30/2018   LDLCALC 37 03/30/2018   TRIG 127 03/30/2018   CHOLHDL 2.6 05/05/2017    Significant Diagnostic Results in last 30 days:  No results found.  Assessment/Plan Facial Rash Did better on Prednisone  No Signs of infection Unable to take him to Dermatology die to Transport issues Will do Prednisone 10 mg QD for 7 days Also Aquaphor and Cutivate Follow up with Dermatology   Other issues Combined systolic and diastolic congestive heart failure, Continue Demadex Bun and Creat Stable Ventricular tachycardia (HCC) [name]/p PPP ON Amiodarone Needs TSH and Hepatic Panel Q 6 months Osteoarthritis of multiple joints, unspecified osteoarthritis type Can Use Meloxicam PRN On Robaxin also Hypothyroidism due to medication TSH Normal RLS (restless legs syndrome) Continue Requip Urinary retention Chronic Catheter Gastroesophageal reflux disease without esophagitis On Protonix Neurogenic Orthostatic hypotension On Droxidopa and Midodrine Chronic constipation Trulance Depression On Cymbalta Doing well. No GDR Chronic left-sided low back pain without sciatica Had CT scan in 2019 which Showed Mild Spinal stenosis and Compression Fracture of L3 On Robaxin and Meloxicam PRN   Family/ staff Communication:   Labs/tests ordered:

## 2019-07-28 ENCOUNTER — Other Ambulatory Visit: Payer: Self-pay

## 2019-07-28 NOTE — Telephone Encounter (Signed)
Refill request received from pharmacy for Prednisone 10 mg

## 2019-07-29 MED ORDER — PREDNISONE 10 MG PO TABS: 10.0000 mg | ORAL_TABLET | Freq: Every day | ORAL | 0 refills | Status: DC

## 2019-08-02 NOTE — Telephone Encounter (Signed)
Disregard opened in error °

## 2019-08-04 IMAGING — CR DG CHEST 2V
2 series · 2 of 2 positions shown · non-contrast
Comparison: 11/24/2017 and chest, abdomen and pelvis CT dated
11/11/2017.

CLINICAL DATA: Shortness of breath for several days.

EXAM:
CHEST - 2 VIEW

[w chest lat]
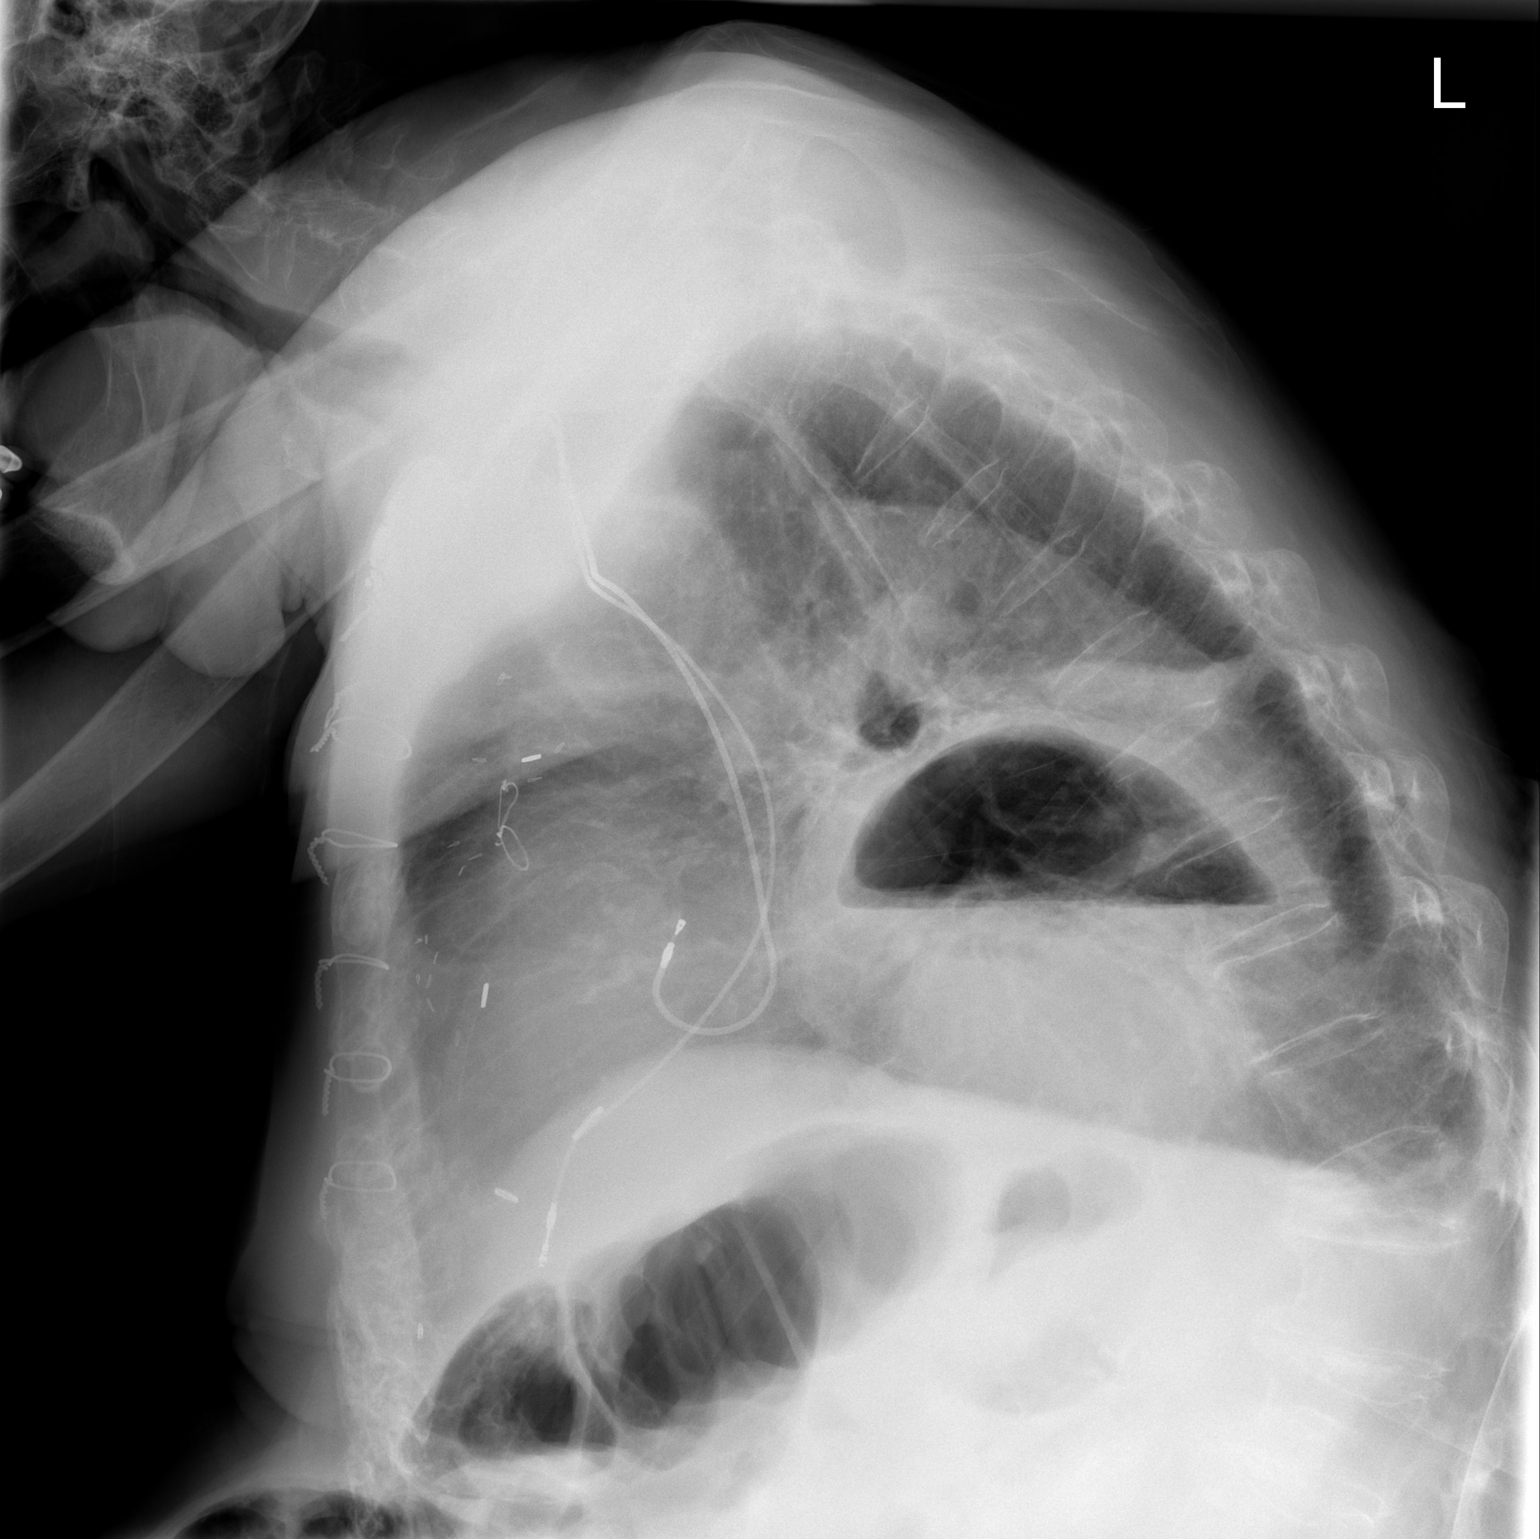

[w chest ap]
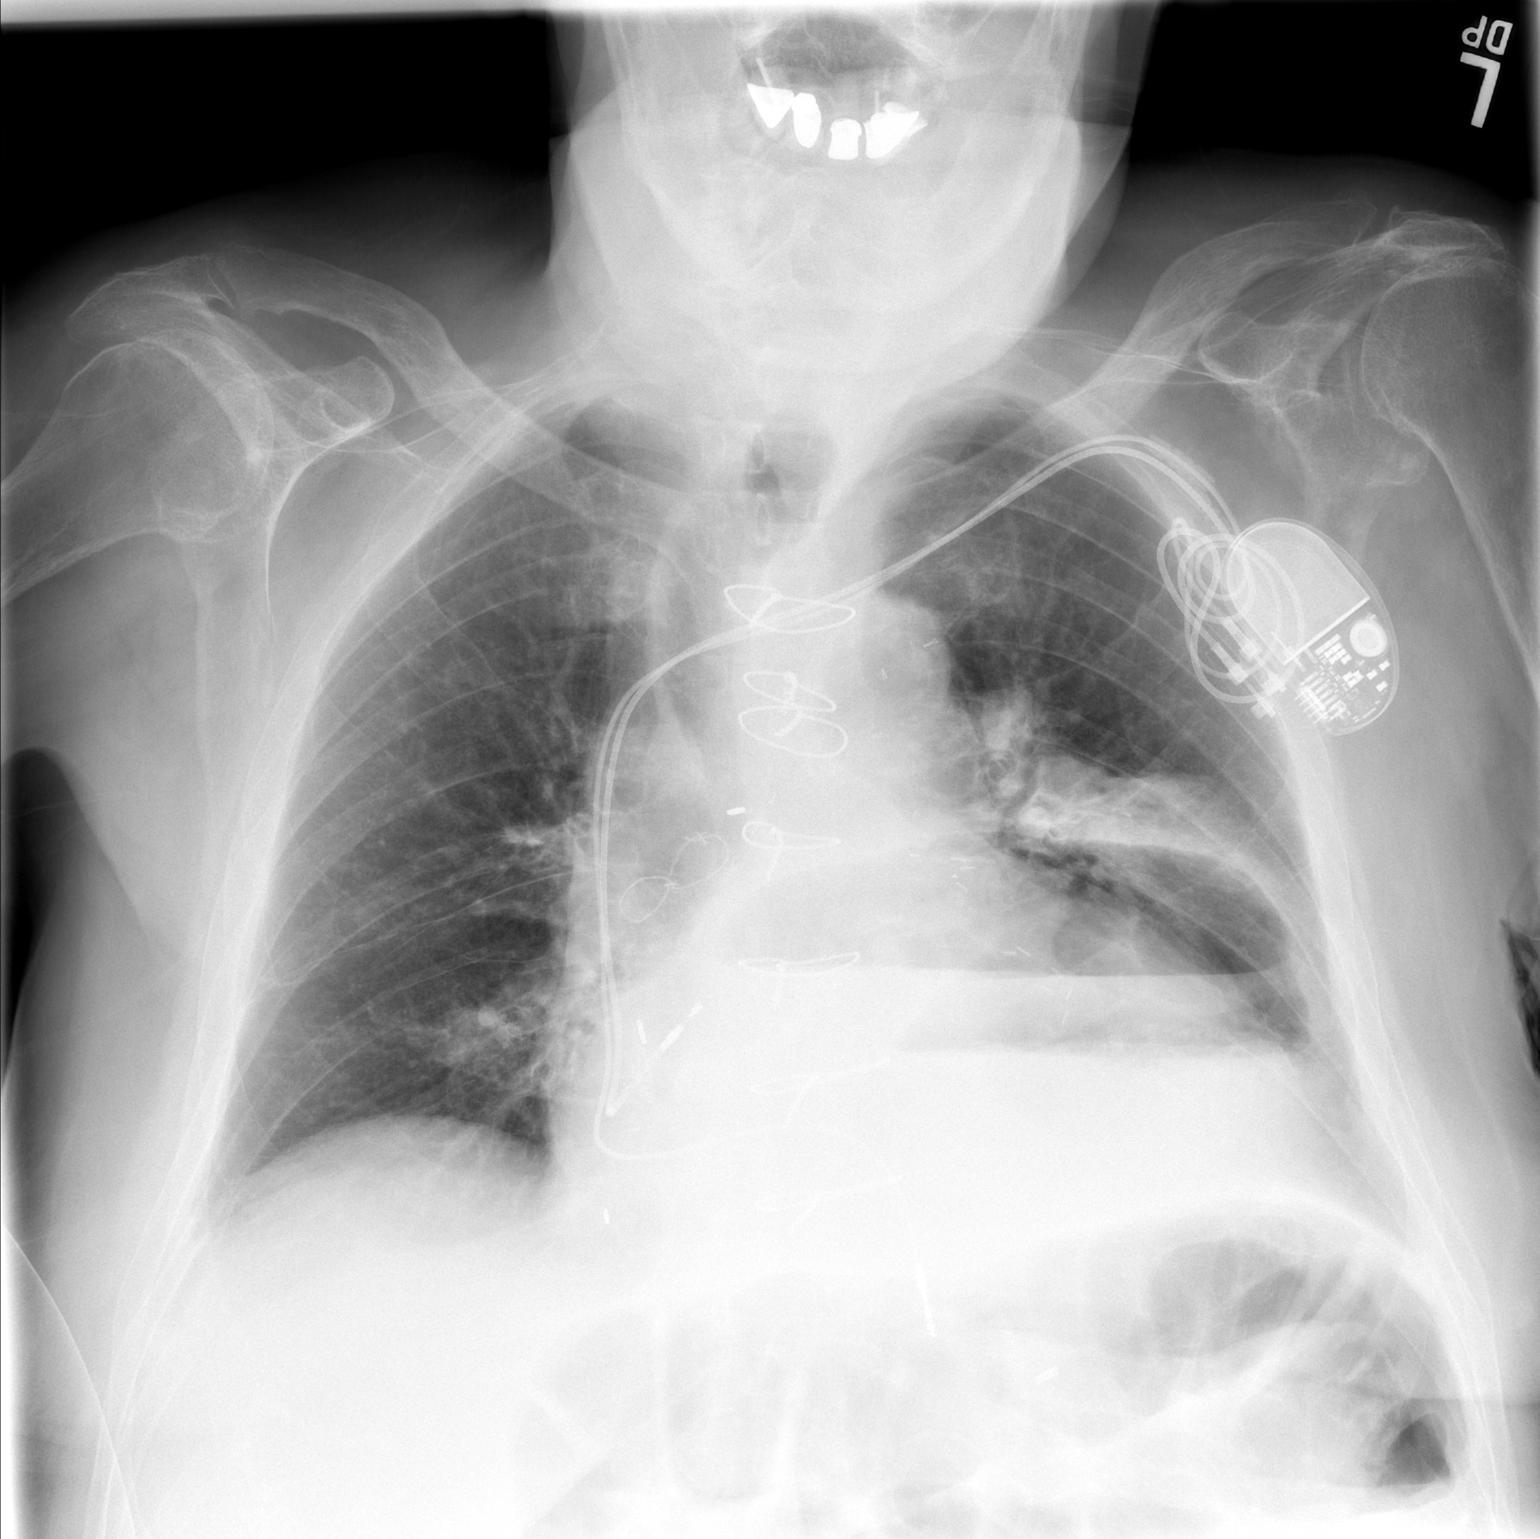

[2 of 2 positions shown; findings below may reference images not displayed]

FINDINGS: Interval multiple air-fluid levels in the previously demonstrated
large retrocardiac hiatal hernia. There is also gas distended bowel
in the upper abdomen with a core suggesting gas-filled colon. Mild
compressive atelectasis in the left lung adjacent to the large
hiatal hernia. Clear right lung. Stable post CABG changes and left
subclavian pacemaker leads. Interval 80% vertebral compression
deformity, most likely at the T12 level with an interval 70%
compression deformity at the L2 level, new since 11/11/2017. No
visible acute fracture lines or bony retropulsion.
IMPRESSION: 1. Interval multiple air-fluid levels in the previously demonstrated
large retrocardiac hiatal hernia, suspicious for gastric outlet
obstruction.
2. Interval 80% T12 vertebral compression fracture and interval 70%
L2 compression fracture with acute kyphosis at the T12 level.
3. Mild compressive atelectasis in the left lung.

## 2019-08-07 IMAGING — CT CT L SPINE W/O CM
3 series · 16 of 33 positions shown, 19 images · IV contrast (ISOVUE)
Comparison: CT abdomen pelvis 11/11/2017

CLINICAL DATA: Back pain for 3 weeks

EXAM:
CT LUMBAR SPINE WITHOUT CONTRAST
TECHNIQUE: Multidetector CT imaging of the lumbar spine was performed without
intravenous contrast administration. Multiplanar CT image
reconstructions were also generated.

[Series 602: lspine axial st · axial · 0.50mm/px · z∈[+1228,+1440]mm · 8 of 124 slices shown, 10 images]
[im 10/124  soft-tissue]
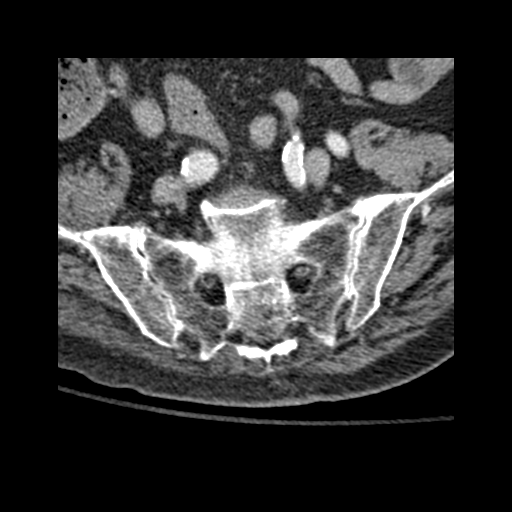
[im 10/124  bone]
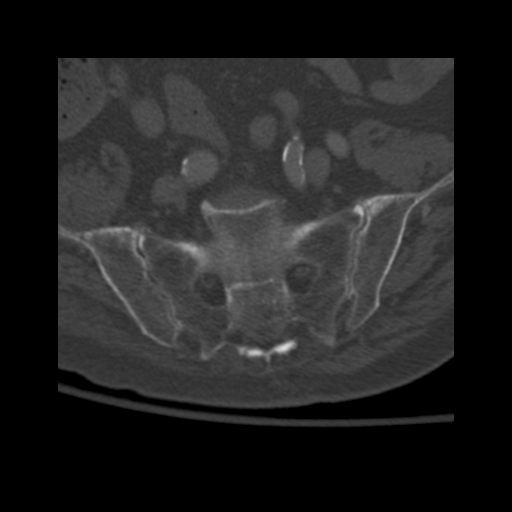
[im 29/124  bone]
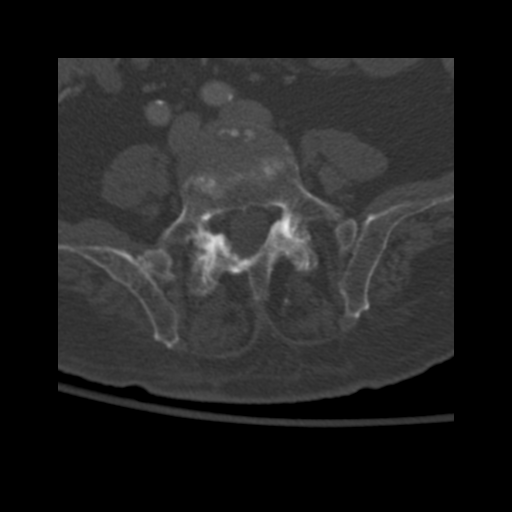
[im 38/124  bone]
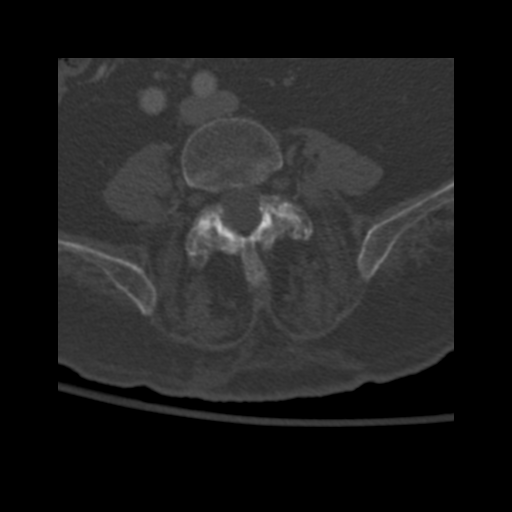
[im 57/124  bone]
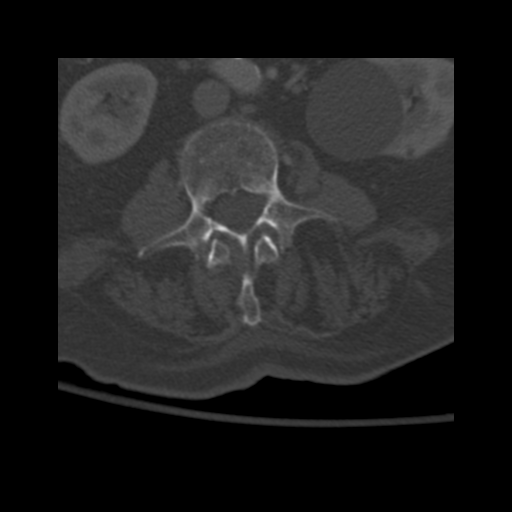
[im 67/124  soft-tissue]
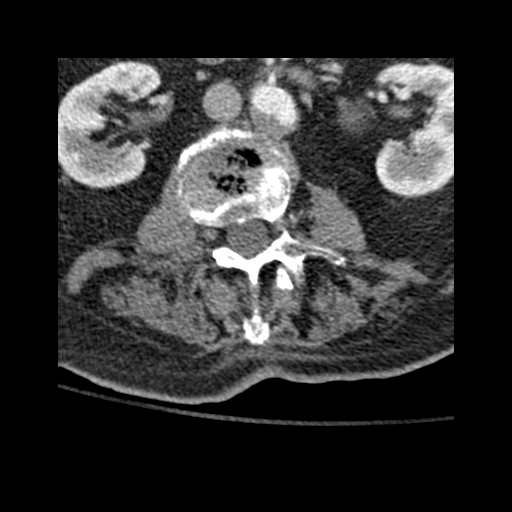
[im 67/124  bone]
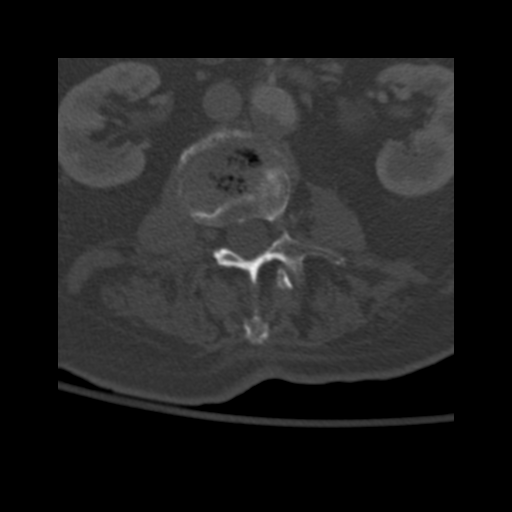
[im 86/124  bone]
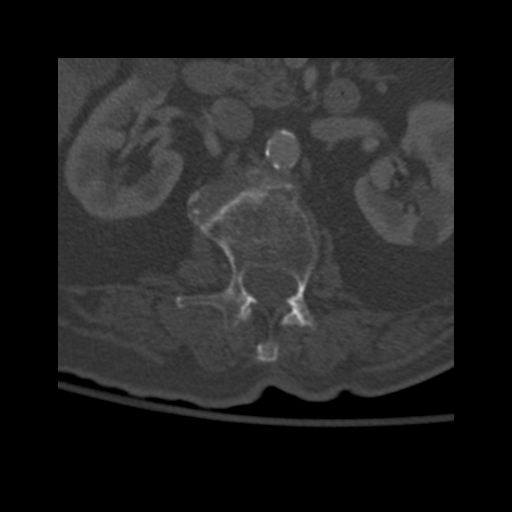
[im 95/124  bone]
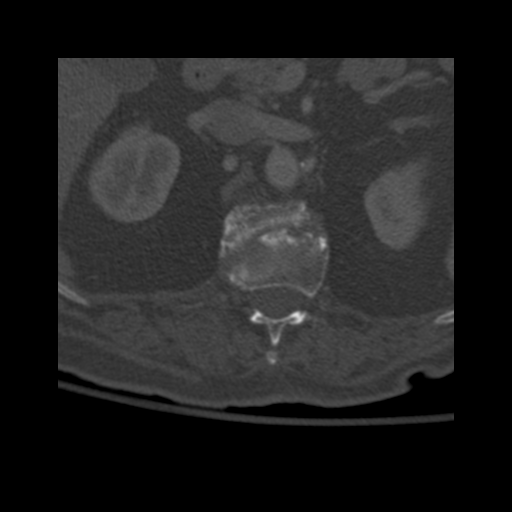
[im 114/124  bone]
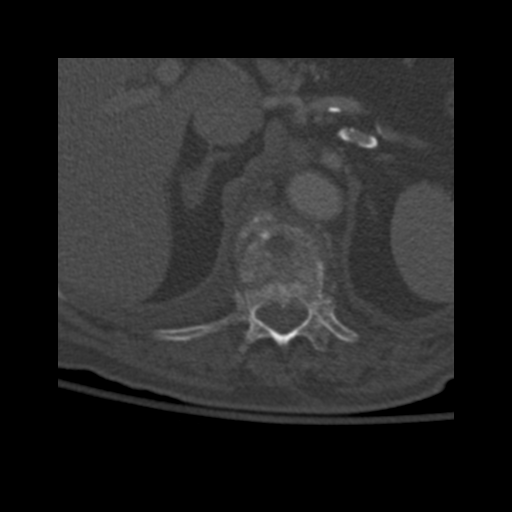

[Series 603: st coronal lspine · coronal · 0.50mm/px · 3 of 75 slices shown]
[im 15/75  bone]
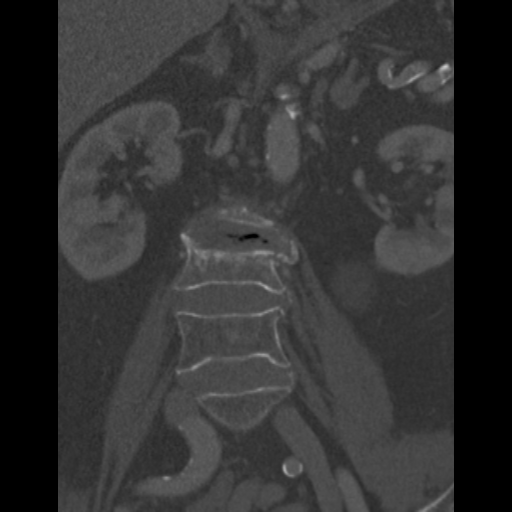
[im 30/75  bone]
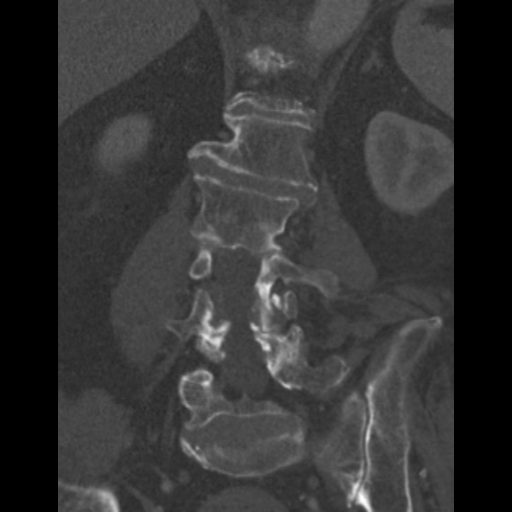
[im 45/75  bone]
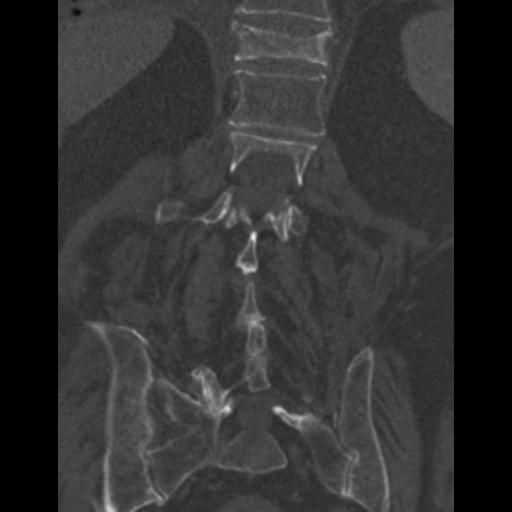

[Series 604: st lspine sag · sagittal · 0.50mm/px · 5 of 66 slices shown, 6 images]
[im 22/66  bone]
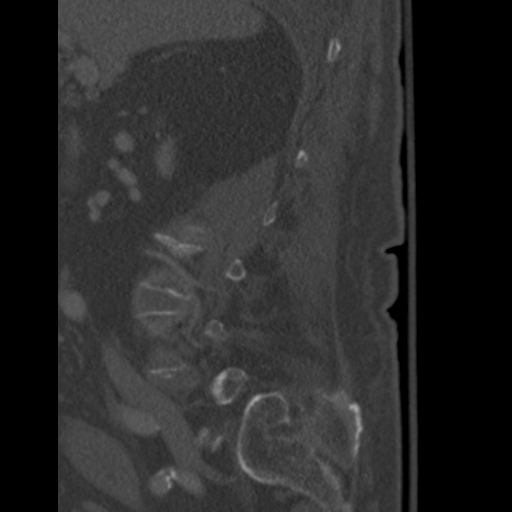
[im 28/66  bone]
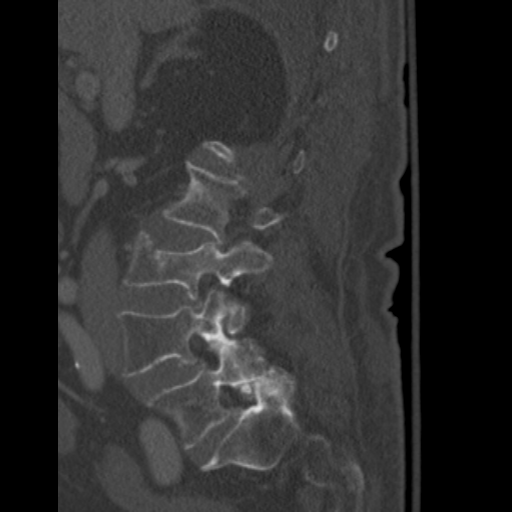
[im 33/66  soft-tissue]
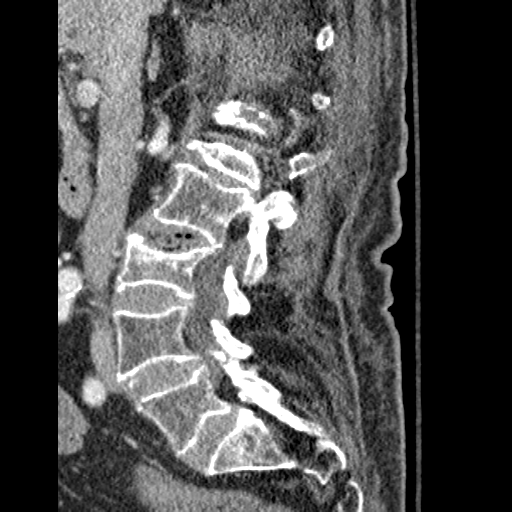
[im 33/66  bone]
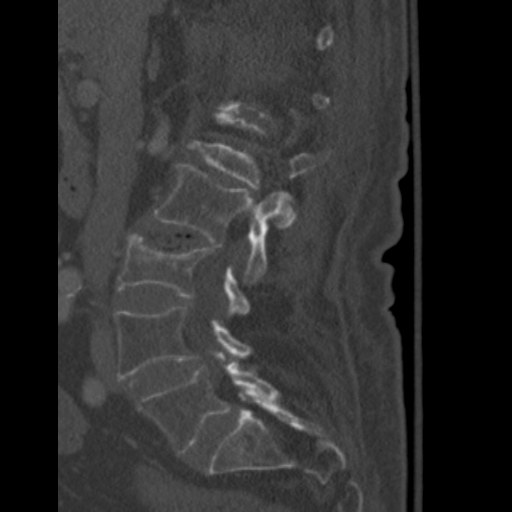
[im 38/66  bone]
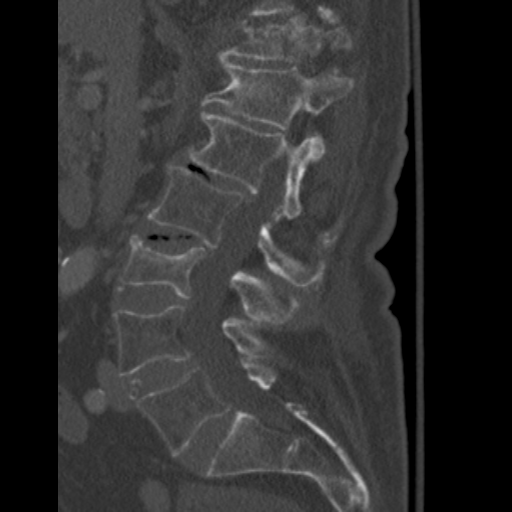
[im 44/66  bone]
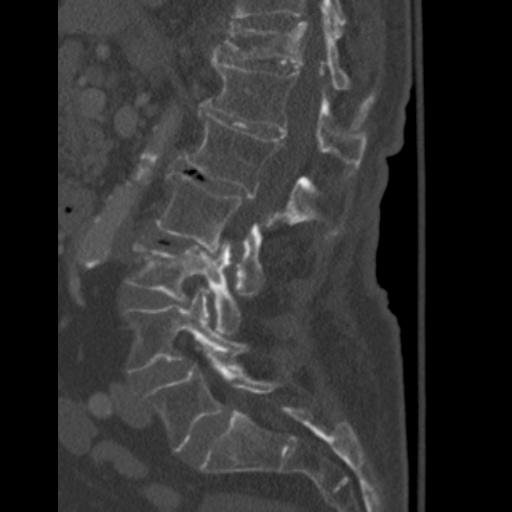

[16 of 33 positions shown; findings below may reference images not displayed]

FINDINGS: Segmentation: Normal

Alignment: Right convex scoliosis apex at L3.

Vertebrae: Acute compression fracture at T11 with approximately 50%
height loss, involving the superior endplate and anterior wall.
Approximately 7 mm retropulsion. Acute or subacute compression
fracture of L3 with approximately 50% height loss and no
retropulsion. No other focal vertebral body lesion.

Paraspinal and other soft tissues: Bilateral renal cysts, measuring
up to 0.5 cm. Small right pleural effusion.

Disc levels:

T10-11: Unremarkable.

T11-12: Retropulsion of the posterior wall of T11 causes mild spinal
canal stenosis.

T12-L1: No stenosis.

L1-L2: Mild disc bulge and endplate spurring without stenosis.

L2-3: Mild disc bulge and endplate spurring with moderate left
foraminal stenosis, partially due to scoliotic curvature.

L3-4: No spinal canal or neural foraminal stenosis.

L4-5: Normal.

L5-S1: Normal.
IMPRESSION: 1. Acute compression fracture at T11 with approximately 50% height
loss and 7 mm retropulsion. Mild spinal canal stenosis.
2. Acute or subacute compression fracture of L3 with 50% height loss
and no retropulsion.
3. Moderate left L2-3 neural foraminal stenosis.

## 2019-08-10 ENCOUNTER — Ambulatory Visit (INDEPENDENT_AMBULATORY_CARE_PROVIDER_SITE_OTHER): Payer: Medicare PPO | Admitting: *Deleted

## 2019-08-10 DIAGNOSIS — G459 Transient cerebral ischemic attack, unspecified: Secondary | ICD-10-CM

## 2019-08-10 LAB — CUP PACEART REMOTE DEVICE CHECK
Battery Remaining Longevity: 114 mo
Battery Remaining Percentage: 95.5 %
Battery Voltage: 2.98 V
Brady Statistic AP VP Percent: 27 %
Brady Statistic AP VS Percent: 72 %
Brady Statistic AS VP Percent: 1 %
Brady Statistic AS VS Percent: 1 %
Brady Statistic RA Percent Paced: 98 %
Brady Statistic RV Percent Paced: 27 %
Date Time Interrogation Session: 20210706020015
Implantable Lead Implant Date: 20100309
Implantable Lead Implant Date: 20100309
Implantable Lead Location: 753859
Implantable Lead Location: 753860
Implantable Pulse Generator Implant Date: 20181219
Lead Channel Impedance Value: 410 Ohm
Lead Channel Impedance Value: 550 Ohm
Lead Channel Pacing Threshold Amplitude: 0.25 V
Lead Channel Pacing Threshold Amplitude: 0.625 V
Lead Channel Pacing Threshold Pulse Width: 0.4 ms
Lead Channel Pacing Threshold Pulse Width: 0.4 ms
Lead Channel Sensing Intrinsic Amplitude: 12 mV
Lead Channel Sensing Intrinsic Amplitude: 2.7 mV
Lead Channel Setting Pacing Amplitude: 0.875
Lead Channel Setting Pacing Amplitude: 2.5 V
Lead Channel Setting Pacing Pulse Width: 0.4 ms
Lead Channel Setting Sensing Sensitivity: 2 mV
Pulse Gen Model: 2272
Pulse Gen Serial Number: 8965776

## 2019-08-11 NOTE — Progress Notes (Signed)
Remote pacemaker transmission.   

## 2019-08-14 ENCOUNTER — Emergency Department (HOSPITAL_COMMUNITY)
Admission: EM | Admit: 2019-08-14 | Discharge: 2019-08-15 | Disposition: A | Discharge status: Home / Self Care | Payer: Medicare PPO | Attending: Emergency Medicine | Admitting: Emergency Medicine

## 2019-08-14 ENCOUNTER — Other Ambulatory Visit: Payer: Self-pay

## 2019-08-14 DIAGNOSIS — I5043 Acute on chronic combined systolic (congestive) and diastolic (congestive) heart failure: Secondary | ICD-10-CM | POA: Diagnosis not present

## 2019-08-14 DIAGNOSIS — Z950 Presence of cardiac pacemaker: Secondary | ICD-10-CM | POA: Diagnosis not present

## 2019-08-14 DIAGNOSIS — Z7982 Long term (current) use of aspirin: Secondary | ICD-10-CM | POA: Diagnosis not present

## 2019-08-14 DIAGNOSIS — E039 Hypothyroidism, unspecified: Secondary | ICD-10-CM | POA: Insufficient documentation

## 2019-08-14 DIAGNOSIS — Z79899 Other long term (current) drug therapy: Secondary | ICD-10-CM | POA: Insufficient documentation

## 2019-08-14 DIAGNOSIS — R338 Other retention of urine: Secondary | ICD-10-CM | POA: Diagnosis not present

## 2019-08-14 DIAGNOSIS — I2510 Atherosclerotic heart disease of native coronary artery without angina pectoris: Secondary | ICD-10-CM | POA: Diagnosis not present

## 2019-08-14 DIAGNOSIS — Y628 Failure of sterile precautions during other surgical and medical care: Secondary | ICD-10-CM | POA: Diagnosis not present

## 2019-08-14 DIAGNOSIS — I110 Hypertensive heart disease with heart failure: Secondary | ICD-10-CM | POA: Diagnosis not present

## 2019-08-14 DIAGNOSIS — T8384XA Pain from genitourinary prosthetic devices, implants and grafts, initial encounter: Secondary | ICD-10-CM | POA: Diagnosis present

## 2019-08-14 NOTE — ED Provider Notes (Signed)
[facility] DEPT Provider Note   CSN: [id number] Arrival date & time: 08/14/19  2321     History Chief Complaint  Patient presents with  . Dysuria    Adam [name] is a 86 y.o. male.  86 yo M with a chief complaints of pain at the urinary catheter site.  He thinks he had it changed about a week ago.  Feels like he has been having a lot of pain at his penis.  Per the nursing home notes he actually has had this changed twice in the past 48 hours.  They think less than 800 cc in the past 24 hours of output.  Sent to the ED for evaluation.  The history is provided by the patient.  Illness Severity:  Moderate Onset quality:  Gradual Duration:  2 days Timing:  Constant Progression:  Worsening Chronicity:  New Associated symptoms: no abdominal pain, no chest pain, no congestion, no diarrhea, no fever, no headaches, no myalgias, no rash, no shortness of breath and no vomiting        Past Medical History:  Diagnosis Date  . Arthritis    "minor, back and sometimes knees" (12/15/2012)  . Bradycardia    AFib/SSS s/p St Jude PPM 04/12/2008  . CAD (coronary artery disease) 12/30/2014   CABG (LIMA-LAD, SVG-RCA, SVG-OM in 1996).  07/2009 BMS to SVG-RCA. Cath in 04/2010 with patent stents   . Cardiomyopathy, ischemic 08/25/2012  . CHF (congestive heart failure) ([place])   . Chronic knee pain 12/03/2014  . Combined congestive systolic and diastolic heart failure ([place]) 02/02/2015   Hx EF 41%. BNP 96.8 02/21/15 Torsemide 04/06/15 Na 142, K 4.6, Bun 16, creat 0.89 04/20/14 BNP 111.7, Na 142, K 4.6, Bun 16, creat 0.9   . Depression with anxiety 02/02/2015   02/21/15 Hgb A1c 5.8 03/10/15 MMSE 30/30   . Dizziness, after diuretic asscoiated with hypotension and responded to fluid bolus 06/05/2011   04/28/15 US carotid R+L normal bilateral arterial velocities.    Marland Kitchen Dyspnea 08/11/2014   Followed in Pulmonary clinic/ [facility]/ [place]  - 08/11/2014  Walked RA x 1 laps @ 185 ft  each stopped due to fatigue/off balance/ slow pace/  no sob or desat  - PFT's  09/26/2014  FEV1 2.26 (85 % ) ratio 76  p no % improvement from saba with DLCO  67 % corrects to 93 % for alv volume      Since prev study 08/04/13 minimal change lung vol or dlco    . Embolic cerebral infarction ([place]) 12/06/2015  . Exertional shortness of breath    "sometimes walking" (12/15/2012)  . GERD (gastroesophageal reflux disease)   . Gout 02/09/2015  . Heart murmur    "just told I had one today" (12/15/2012)  . Hiatal hernia   . Hyperlipidemia   . Hypertension   . Hypothyroid   . Influenza A 03/10/2016  . Insomnia   . Melanoma of back ([place]) 1976  . Myocardial infarction [facility]) 1996; 2011   "both silent" (12/15/2012)  . Nonrheumatic aortic valve stenosis   . Orthostatic hypotension   . Osteoporosis, senile   . Pacemaker   . RBBB   . Restless leg 02/02/2015  . Right leg weakness 12/06/2015  . [facility] spotted fever   . S/P CABG x 4   . Sick sinus syndrome ([place]) 01/31/2014  . Sustained ventricular tachycardia ([place]) 07/27/2014  . Urinary retention 12/30/2014   has a cathrater    Patient  Active Problem List   Diagnosis Date Noted  . Allergic rhinitis 05/21/2019  . Weight loss 04/06/2019  . Seborrheic dermatitis 04/06/2019  . Edema 10/16/2018  . Ecchymosis of eye 10/13/2018  . T11 vertebral fracture ([place]) 02/13/2018  . L3 vertebral fracture ([place]) 02/13/2018  . Bilateral renal cysts 02/13/2018  . Spondylosis of cervical region without myelopathy or radiculopathy 09/25/2017  . Tear of left rotator cuff 09/25/2017  . Pharyngeal dysphagia 09/08/2017  . Generalized weakness 08/25/2017  . Right shoulder pain 08/25/2017  . Neurogenic bladder 07/21/2017  . Mild cognitive impairment 07/21/2017  . Neurogenic orthostatic hypotension ([place]) 04/21/2017  . Osteoarthritis 04/23/2016  . Gingivitis 04/15/2016  . History of arterial ischemic stroke 03/05/2016  . Aortic valve stenosis, nonrheumatic   .  TIA (transient ischemic attack) 01/10/2016  . Hx of CABG 01/10/2016  . Insomnia 03/23/2015  . Gout 02/09/2015  . Depression, recurrent ([place]) 02/02/2015  . RLS (restless legs syndrome) 02/02/2015  . Combined congestive systolic and diastolic heart failure ([place]) 02/02/2015  . Urinary retention 12/30/2014  . Coronary artery disease involving native coronary artery of native heart without angina pectoris 12/30/2014  . Expressive aphasia 12/30/2014  . Chronic constipation   . Orthostatic hypotension   . Chronic knee pain 12/03/2014  . GERD (gastroesophageal reflux disease)   . Dyspnea on exertion 08/11/2014  . Hypothyroidism 08/11/2014  . Ventricular tachycardia ([place]) 07/27/2014  . Upper airway cough syndrome 07/14/2014  . SSS (sick sinus syndrome) ([place]) 01/31/2014  . Hypercholesterolemia 01/30/2014  . Cardiomyopathy, ischemic 08/25/2012  . Pacemaker 08/25/2012    Past Surgical History:  Procedure Laterality Date  . CARDIAC CATHETERIZATION  04/2010   LIMA to LAD patent,SVG to OM patent,no in-stnet restenosis RCA  . CATARACT EXTRACTION W/ INTRAOCULAR LENS  IMPLANT, BILATERAL Bilateral 2012  . CORONARY ANGIOPLASTY WITH STENT PLACEMENT  07/2009   bare metal stent to SVG to the RCA  . CORONARY ARTERY BYPASS GRAFT  1996   LIMA to LAD,SVG to RCA & SVG to OM  . INSERT / REPLACE / REMOVE PACEMAKER  2010  . MELANOMA EXCISION  05/1974 X2   "taken off my back" (12/15/2012)  . NM MYOVIEW LTD  06/2011   low risk  . PPM GENERATOR CHANGEOUT N/A 01/22/2017   Procedure: PPM GENERATOR CHANGEOUT;  Surgeon: [name], MD;  Location: [facility];  Service: Cardiovascular;  Laterality: N/A;  . TONSILLECTOMY  1938  . TRANSURETHRAL RESECTION OF PROSTATE  1986  . US ECHOCARDIOGRAPHY  07/11/2009   EF 45-50%       Family History  Problem Relation Age of Onset  . Coronary artery disease Mother   . Diabetes Mother   . Heart disease Mother   . Coronary artery disease Father   . Diabetes  Father   . Lung cancer Father   . Heart disease Father   . Anuerysm Son   . Heart disease Brother   . Colon cancer Neg Hx   . Esophageal cancer Neg Hx     Social History   Tobacco Use  . Smoking status: Never Smoker  . Smokeless tobacco: Never Used  Vaping Use  . Vaping Use: Never used  Substance Use Topics  . Alcohol use: No    Alcohol/week: 0.0 standard drinks  . Drug use: No    Home Medications Prior to Admission medications   Medication Sig Start Date End Date Taking? Authorizing Provider  acetaminophen (TYLENOL) 500 MG tablet Take 500 mg by mouth 3 (three)  times daily.     [provider]  Amino Acids-Protein Hydrolys (FEEDING SUPPLEMENT, PRO-STAT SUGAR FREE 64,) LIQD Take 30 mLs by mouth daily.     [provider]  amiodarone (PACERONE) 100 MG tablet Take 1 tablet (100 mg total) by mouth daily. 01/16/17   Croitoru, Mihai, MD  aspirin EC 81 MG tablet Take 1 tablet (81 mg total) by mouth daily. 01/12/16   Reyne Dumas, MD  Cholecalciferol (VITAMIN D3) 5000 UNITS CAPS Take 5,000 Units by mouth every Monday.     [provider]  clotrimazole-betamethasone (LOTRISONE) cream Apply 1 application topically daily as needed.    [provider]  Droxidopa ([name]) 100 MG CAPS TAKE 1 CAPSULE BY MOUTH THREE TIMES DAILY. TAKE LAST DOSE AT LEAST 3 HOURS BEFORE BEDTIME. 12/02/18   Croitoru, Mihai, MD  DULoxetine (CYMBALTA) 20 MG capsule Take 20 mg by mouth daily. For anxiety,insomnia and back pain    [provider]  fluticasone (CUTIVATE) 0.05 % cream Apply topically 2 (two) times daily as needed. Apply small amount on  face and back also can apply as needed 09/07/18   [provider]  fluticasone (FLONASE) 50 MCG/ACT nasal spray Place 1 spray into both nostrils 2 (two) times daily.    [provider]  Glycerin (ORAJEL DRY MOUTH MT) Use as directed in the mouth or throat. Swish and spit 10 ml 4 x day as needed for sore gums.  Continue until dentures fit and/or next dental visit. Four Times A Day - PRN    [provider]  guaiFENesin (ROBITUSSIN) 100 MG/5ML SOLN Take 5 mLs (100 mg total) by mouth every 4 (four) hours as needed for cough or to loosen phlegm. 09/04/17   Lavina Hamman, MD  ketoconazole (NIZORAL) 2 % cream Apply 1 application topically as needed for irritation. Apply small amount behind ears and in creases beside nose BID x 7 days. MAY REPEAT PRN. (Family supplied).    [provider]  levothyroxine (SYNTHROID, LEVOTHROID) 100 MCG tablet Take 100 mcg by mouth daily before breakfast.    [provider]  meclizine (ANTIVERT) 25 MG tablet Take 1 tablet (25 mg total) by mouth 2 (two) times daily as needed for dizziness. 09/04/17   Lavina Hamman, MD  Melatonin 3 MG TABS Take 3 mg by mouth at bedtime.    [provider]  methocarbamol (ROBAXIN) 500 MG tablet Take 250 mg by mouth 2 (two) times daily. As needed    [provider]  midodrine (PROAMATINE) 5 MG tablet Take 5 mg by mouth 3 (three) times daily with meals. Notify provider if BP is < 90/60.    [provider]  mineral oil-hydrophilic petrolatum (AQUAPHOR) ointment Apply 1 application topically 2 (two) times daily as needed for dry skin. To face    [provider]  Nutritional Supplements (FEEDING SUPPLEMENT, BOOST BREEZE,) LIQD Take by mouth 2 (two) times daily. 06/01/18   [provider]  pantoprazole (PROTONIX) 40 MG tablet Take 40 mg by mouth daily.    [provider]  Plecanatide (TRULANCE) 3 MG TABS Take 3 mg by mouth daily. 01/22/18   Cirigliano, Vito V, DO  polyethylene glycol (MIRALAX / GLYCOLAX) 17 g packet Take 17 g by mouth daily as needed. Take 1-2 capfuls in 8 oz of H2O    [provider]  pravastatin (PRAVACHOL) 40 MG tablet Take 1 tablet (40 mg total) by mouth at bedtime. 10/21/16   Blanchie Serve, MD  predniSONE (DELTASONE) 10 MG tablet Take 1 tablet (10 mg  total) by mouth daily with breakfast. 07/29/19   [name], MD  rOPINIRole (REQUIP) 0.25 MG tablet Take 0.5 mg by mouth 2 (two) times daily.    [provider]  rOPINIRole (REQUIP) 0.5 MG tablet Take 0.5 mg by mouth daily as needed.    [provider]  sennosides-docusate sodium (SENOKOT-S) 8.6-50 MG tablet Take 2 tablets by mouth 2 (two) times daily.     [provider]  torsemide (DEMADEX) 10 MG tablet Take 20 mg by mouth daily. 20 mg once a day    [provider]    Allergies    Altace [ramipril], Crestor [rosuvastatin calcium], Penicillins, Sulfa antibiotics, and Sulfamethoxazole-trimethoprim  Review of Systems   Review of Systems  Constitutional: Negative for chills and fever.  HENT: Negative for congestion and facial swelling.   Eyes: Negative for discharge and visual disturbance.  Respiratory: Negative for shortness of breath.   Cardiovascular: Negative for chest pain and palpitations.  Gastrointestinal: Negative for abdominal pain, diarrhea and vomiting.  Genitourinary: Positive for decreased urine volume and penile pain.  Musculoskeletal: Negative for arthralgias and myalgias.  Skin: Negative for color change and rash.  Neurological: Negative for tremors, syncope and headaches.  Psychiatric/Behavioral: Negative for confusion and dysphoric mood.    Physical Exam Updated Vital Signs BP 139/75 (BP Location: Left Arm)   Pulse 72   Temp 98.2 F (36.8 C) (Oral)   Resp 16   Ht 5\' 10"  (1.778 m)   Wt 80.3 kg   SpO2 95%   BMI 25.40 kg/m   Physical Exam Vitals and nursing note reviewed.  Constitutional:      Appearance: He is well-developed.  HENT:     Head: Normocephalic and atraumatic.  Eyes:     Pupils: Pupils are equal, round, and reactive to light.  Neck:     Vascular: No JVD.  Cardiovascular:     Rate and Rhythm: Normal rate and regular rhythm.     Heart sounds: No murmur heard.  No friction rub. No gallop.   Pulmonary:       Effort: No respiratory distress.     Breath sounds: No wheezing.  Abdominal:     General: There is no distension.     Tenderness: There is abdominal tenderness (suprapubic fullness). There is no guarding or rebound.  Genitourinary:    Comments: Foley in place, urine noted at the meatus Musculoskeletal:        General: Normal range of motion.     Cervical back: Normal range of motion and neck supple.  Skin:    Coloration: Skin is not pale.     Findings: No rash.  Neurological:     Mental Status: He is alert and oriented to person, place, and time.  Psychiatric:        Behavior: Behavior normal.     ED Results / Procedures / Treatments   Labs (all labs ordered are listed, but only abnormal results are displayed) Labs Reviewed - No data to display  EKG None  Radiology No results found.  Procedures Procedures (including critical care time) Emergency Focused Ultrasound Exam Limited ultrasound of the Bladder.   Performed and interpreted by Dr. Tyrone Nine Indication: evaluation for urinary retention Transverse and Sagittal views of the bladder are obtained and calculations are performed to determine an estimated bladder volume for signs of post-renal obstruction.  Findings: Bladder is 450cc full Interpretation:  evidence of outlet  obstruction Images archived electronically.  CPT Codes: [phone] and [phone]  Medications Ordered in ED Medications - No data to display  ED Course  I have reviewed the triage vital signs and the nursing notes.  Pertinent labs & imaging results that were available during my care of the patient were reviewed by me and considered in my medical decision making (see chart for details).    MDM Rules/Calculators/A&P                          86 yo M with a chief complaints of pain to the Foley catheter site at his penis.  Palpable bladder on my exam bedside ultrasound with Foley bulb apparently in the center of the bladder.  He has no current urinary  output even though he is about 500 cc of urine.  We will troubleshoot the Foley.  Replace if needed.  Foley irrigated with greater than a liter of output.  Patient feeling much better on reassessment.  PCP follow-up.  1:07 AM:  I have discussed the diagnosis/risks/treatment options with the patient and believe the pt to be eligible for discharge home to follow-up with PCP, urology. We also discussed returning to the ED immediately if new or worsening sx occur. We discussed the sx which are most concerning (e.g., sudden worsening pain, fever, inability to tolerate by mouth) that necessitate immediate return. Medications administered to the patient during their visit and any new prescriptions provided to the patient are listed below.  Medications given during this visit Medications - No data to display   The patient appears reasonably screen and/or stabilized for discharge and I doubt any other medical condition or other [facility] requiring further screening, evaluation, or treatment in the ED at this time prior to discharge.   Final Clinical Impression(s) / ED Diagnoses Final diagnoses:  Retention of urine due to occlusion of Foley catheter [facility])    Rx / DC Orders ED Discharge Orders    None       Deno Etienne, DO 08/15/19 0107

## 2019-08-15 ENCOUNTER — Other Ambulatory Visit: Payer: Self-pay

## 2019-08-15 NOTE — ED Triage Notes (Signed)
Pt reports pelvic pain and penile pain since last night, facility replaced foley x 2 but pt has had decreased drainage and increased pain, denies fever

## 2019-08-15 NOTE — Discharge Instructions (Signed)
You had a blood clot that was blocking your Foley catheter from draining.  If this occurs to you again at home we could try and gently irrigated with saline.  Please return if you have recurrence of pain and feel that you have decreased output from a catheter that you cannot fix yourself.  Follow-up with your urologist as needed.

## 2019-08-15 NOTE — ED Notes (Signed)
Foley irrigated with 51ml x 2, blood clots noted. Foley draining well at this time with >1070ml of output. MD notified

## 2019-08-15 NOTE — ED Notes (Signed)
[facility] called for transport.  

## 2019-08-16 ENCOUNTER — Non-Acute Institutional Stay (SKILLED_NURSING_FACILITY): Payer: Medicare PPO | Admitting: Nurse Practitioner

## 2019-08-16 ENCOUNTER — Encounter: Payer: Self-pay | Admitting: Nurse Practitioner

## 2019-08-16 DIAGNOSIS — E032 Hypothyroidism due to medicaments and other exogenous substances: Secondary | ICD-10-CM

## 2019-08-16 DIAGNOSIS — I951 Orthostatic hypotension: Secondary | ICD-10-CM

## 2019-08-16 DIAGNOSIS — K5909 Other constipation: Secondary | ICD-10-CM

## 2019-08-16 DIAGNOSIS — I472 Ventricular tachycardia, unspecified: Secondary | ICD-10-CM

## 2019-08-16 DIAGNOSIS — G8929 Other chronic pain: Secondary | ICD-10-CM

## 2019-08-16 DIAGNOSIS — H00011 Hordeolum externum right upper eyelid: Secondary | ICD-10-CM | POA: Diagnosis not present

## 2019-08-16 DIAGNOSIS — K219 Gastro-esophageal reflux disease without esophagitis: Secondary | ICD-10-CM

## 2019-08-16 DIAGNOSIS — G2581 Restless legs syndrome: Secondary | ICD-10-CM

## 2019-08-16 DIAGNOSIS — R319 Hematuria, unspecified: Secondary | ICD-10-CM

## 2019-08-16 DIAGNOSIS — R339 Retention of urine, unspecified: Secondary | ICD-10-CM

## 2019-08-16 DIAGNOSIS — I5040 Unspecified combined systolic (congestive) and diastolic (congestive) heart failure: Secondary | ICD-10-CM

## 2019-08-16 DIAGNOSIS — F339 Major depressive disorder, recurrent, unspecified: Secondary | ICD-10-CM

## 2019-08-16 DIAGNOSIS — M25569 Pain in unspecified knee: Secondary | ICD-10-CM

## 2019-08-16 NOTE — Assessment & Plan Note (Signed)
TSH 1.71 03/15/19, continue Levothyroxine.  

## 2019-08-16 NOTE — Assessment & Plan Note (Signed)
ED 08/14/19 for blood clot blocking Foley catheter for drainage, may use saline irrigation gently.

## 2019-08-16 NOTE — Assessment & Plan Note (Signed)
RLS, stable, continue Ropinirole 0.5mg  bid/prn

## 2019-08-16 NOTE — Assessment & Plan Note (Signed)
Stable, continue Trulance, Senokot S, MiraLax.

## 2019-08-16 NOTE — Assessment & Plan Note (Signed)
Stable, continue Pantoprazole.  

## 2019-08-16 NOTE — Assessment & Plan Note (Signed)
His mood is stable, continue Duloxetine.  

## 2019-08-16 NOTE — Assessment & Plan Note (Signed)
s/p PPP, heart rate is in control, continue  Amiodarone 100mg  qd.

## 2019-08-16 NOTE — Assessment & Plan Note (Signed)
Trace edema BLE, compensated otherwise, continue Torsemide.

## 2019-08-16 NOTE — Progress Notes (Signed)
Location:    [place] Room Number: 29 Place of Service:  SNF (31) Provider: Lennie Odor Burleigh Brockmann NP  [name], MD  Patient Care Team: [name], MD as PCP - General (Internal Medicine) [name], MD as PCP - Cardiology (Cardiology) [name], MD as Attending Physician (Cardiology) Vevelyn Royals, MD as Consulting Physician (Ophthalmology) Franchot Gallo, MD as Consulting Physician (Urology) Tanda Rockers, MD as Consulting Physician (Pulmonary Disease) Allyn Kenner, MD (Dermatology) Deliah Goody, PA-C as Physician Assistant (Physician Assistant) Abelardo Seidner X, NP as Nurse Practitioner (Internal Medicine) Ngetich, Nelda Bucks, NP as Nurse Practitioner (Family Medicine)  Extended Emergency Contact Information Primary Emergency Contact: Delfino,[name] Address: [address], [place] Phone: [phone] Mobile Phone: [phone] Relation: Spouse Secondary Emergency Contact: [name],Tarango  [place] Mobile Phone: [phone] Relation: Daughter  Code Status:  DNR Goals of care: Advanced Directive information Advanced Directives 07/16/2019  Does Patient Have a Medical Advance Directive? Yes  Type of Advance Directive [place]  Does patient want to make changes to medical advance directive? No - Patient declined  Copy of [place] in Chart? Yes - validated most recent copy scanned in chart (See row information)  Would patient like information on creating a medical advance directive? -  Pre-existing out of facility DNR order (yellow form or pink MOST form) -     Chief Complaint  Patient presents with  . Acute Visit    f/u ED eval.    HPI:  Pt is a 86 y.o. male seen today for an acute visit for f/u persisted hematuria, s/p ED evaluation for urinary retention due to blood clot in Foley catheter 08/14/19. The patient is afebrile, denied  dysuria, suprapubic or penis  Pain/discomort. He denied nausea, vomiting, or constipation.   Also the patient c/o sore in the right eye lid, noted a stye in the right lower eyelid, no injection of the right eye or change of vision or pain in eye.   Hx of V tach, s/p PPP, heart rate is in control, on Amiodarone 191m qd.  His mood is stable, on Duloxetine 238mqd.   CHF/edema BLE, stable, taking Torsemide 2024md.  OA multiple sites, chronic lower left back pain, chronic knee pian, takes Tylenol 500m39md, Robaxin 250mg81m, .   Urinary retention, Foley.   GERD, stable, on Pantoprazole.  Orthostatic hypotension, stable, on Droxidopa 100mg 44m  Midodrine 5mg ti33m   Constipation, stable, on Trulance 3mg qd,68mnokot S II bid, MiraLax prn.  Hypothyroidism, on Levothyroxine 100mcg qd50mRLS, stable, on Ropinirole 0.5mg bid/p44m       Past Medical History:  Diagnosis Date  . Arthritis    "minor, back and sometimes knees" (12/15/2012)  . Bradycardia    AFib/SSS s/p St Jude PPM 04/12/2008  . CAD (coronary artery disease) 12/30/2014   CABG (LIMA-LAD, SVG-RCA, SVG-OM in 1996).  07/2009 BMS to SVG-RCA. Cath in 04/2010 with patent stents   . Cardiomyopathy, ischemic 08/25/2012  . CHF (congestive heart failure) (HCC)   . CHailesboronic knee pain 12/03/2014  . Combined congestive systolic and diastolic heart failure (HCC) 12/29[place]16   Hx EF 41%. BNP 96.8 02/21/15 Torsemide 04/06/15 Na 142, K 4.6, Bun 16, creat 0.89 04/20/14 BNP 111.7, Na 142, K 4.6, Bun 16, creat 0.9   . Depression with anxiety 02/02/2015  02/21/15 Hgb A1c 5.8 03/10/15 MMSE 30/30   . Dizziness, after diuretic asscoiated with hypotension and responded to fluid bolus 06/05/2011   04/28/15 US carotid R+L normal bilateral arterial velocities.    Marland Kitchen Dyspnea 08/11/2014   Followed in Pulmonary clinic/ [facility]/ [place]  - 08/11/2014  Walked RA x 1 laps @ 185 ft each stopped due to fatigue/off balance/ slow pace/  no sob or desat  - PFT's  09/26/2014  FEV1  2.26 (85 % ) ratio 76  p no % improvement from saba with DLCO  67 % corrects to 93 % for alv volume      Since prev study 08/04/13 minimal change lung vol or dlco    . Embolic cerebral infarction ([place]) 12/06/2015  . Exertional shortness of breath    "sometimes walking" (12/15/2012)  . GERD (gastroesophageal reflux disease)   . Gout 02/09/2015  . Heart murmur    "just told I had one today" (12/15/2012)  . Hiatal hernia   . Hyperlipidemia   . Hypertension   . Hypothyroid   . Influenza A 03/10/2016  . Insomnia   . Melanoma of back ([place]) 1976  . Myocardial infarction [facility]) 1996; 2011   "both silent" (12/15/2012)  . Nonrheumatic aortic valve stenosis   . Orthostatic hypotension   . Osteoporosis, senile   . Pacemaker   . RBBB   . Restless leg 02/02/2015  . Right leg weakness 12/06/2015  . [facility] spotted fever   . S/P CABG x 4   . Sick sinus syndrome ([place]) 01/31/2014  . Sustained ventricular tachycardia ([place]) 07/27/2014  . Urinary retention 12/30/2014   has a cathrater   Past Surgical History:  Procedure Laterality Date  . CARDIAC CATHETERIZATION  04/2010   LIMA to LAD patent,SVG to OM patent,no in-stnet restenosis RCA  . CATARACT EXTRACTION W/ INTRAOCULAR LENS  IMPLANT, BILATERAL Bilateral 2012  . CORONARY ANGIOPLASTY WITH STENT PLACEMENT  07/2009   bare metal stent to SVG to the RCA  . CORONARY ARTERY BYPASS GRAFT  1996   LIMA to LAD,SVG to RCA & SVG to OM  . INSERT / REPLACE / REMOVE PACEMAKER  2010  . MELANOMA EXCISION  05/1974 X2   "taken off my back" (12/15/2012)  . NM MYOVIEW LTD  06/2011   low risk  . PPM GENERATOR CHANGEOUT N/A 01/22/2017   Procedure: PPM GENERATOR CHANGEOUT;  Surgeon: [name], MD;  Location: [place] CV LAB;  Service: Cardiovascular;  Laterality: N/A;  . TONSILLECTOMY  1938  . TRANSURETHRAL RESECTION OF PROSTATE  1986  . US ECHOCARDIOGRAPHY  07/11/2009   EF 45-50%    Allergies  Allergen Reactions  . Altace [Ramipril] Cough  . Crestor  [Rosuvastatin Calcium] Rash  . Penicillins Rash and Other (See Comments)    Has patient had a PCN reaction causing immediate rash, facial/tongue/throat swelling, SOB or lightheadedness with hypotension: Yes Has patient had a PCN reaction causing severe rash involving mucus membranes or skin necrosis: No Has patient had a PCN reaction that required hospitalization No Has patient had a PCN reaction occurring within the last 10 years: No If all of the above answers are "NO", then may proceed with Cephalosporin use.   . Sulfa Antibiotics Rash  . Sulfamethoxazole-Trimethoprim Rash    Allergies as of 08/16/2019      Reactions   Altace [ramipril] Cough   Crestor [rosuvastatin Calcium] Rash   Penicillins Rash, Other (See Comments)   Has patient had a PCN reaction causing  immediate rash, facial/tongue/throat swelling, SOB or lightheadedness with hypotension: Yes Has patient had a PCN reaction causing severe rash involving mucus membranes or skin necrosis: No Has patient had a PCN reaction that required hospitalization No Has patient had a PCN reaction occurring within the last 10 years: No If all of the above answers are "NO", then may proceed with Cephalosporin use.   Sulfa Antibiotics Rash   Sulfamethoxazole-trimethoprim Rash      Medication List       Accurate as of August 16, 2019  5:02 PM. If you have any questions, ask your nurse or doctor.        STOP taking these medications   fluticasone 0.05 % cream Commonly known as: CUTIVATE Stopped by: Cayla Wiegand X Allani Reber, NP   predniSONE 10 MG tablet Commonly known as: DELTASONE Stopped by: Reginaldo Hazard X Kalana Yust, NP     TAKE these medications   acetaminophen 500 MG tablet Commonly known as: TYLENOL Take 500 mg by mouth 3 (three) times daily.   amiodarone 100 MG tablet Commonly known as: PACERONE Take 1 tablet (100 mg total) by mouth daily.   aspirin EC 81 MG tablet Take 1 tablet (81 mg total) by mouth daily.   clotrimazole-betamethasone  cream Commonly known as: LOTRISONE Apply 1 application topically daily as needed.   DULoxetine 20 MG capsule Commonly known as: CYMBALTA Take 20 mg by mouth daily. For anxiety,insomnia and back pain   feeding supplement (BOOST BREEZE) Liqd Take by mouth 2 (two) times daily.   feeding supplement (PRO-STAT SUGAR FREE 64) Liqd Take 30 mLs by mouth daily.   fluticasone 50 MCG/ACT nasal spray Commonly known as: FLONASE Place 1 spray into both nostrils 2 (two) times daily.   guaiFENesin 100 MG/5ML Soln Commonly known as: ROBITUSSIN Take 5 mLs (100 mg total) by mouth every 4 (four) hours as needed for cough or to loosen phlegm.   ketoconazole 2 % cream Commonly known as: NIZORAL Apply 1 application topically as needed for irritation. Apply small amount behind ears and in creases beside nose BID x 7 days. MAY REPEAT PRN. (Family supplied).   levothyroxine 100 MCG tablet Commonly known as: SYNTHROID Take 100 mcg by mouth daily before breakfast.   meclizine 25 MG tablet Commonly known as: ANTIVERT Take 1 tablet (25 mg total) by mouth 2 (two) times daily as needed for dizziness.   melatonin 3 MG Tabs tablet Take 3 mg by mouth at bedtime.   methocarbamol 500 MG tablet Commonly known as: ROBAXIN Take 250 mg by mouth 2 (two) times daily. As needed   midodrine 5 MG tablet Commonly known as: PROAMATINE Take 5 mg by mouth 3 (three) times daily with meals. Notify provider if BP is < 90/60.   mineral oil-hydrophilic petrolatum ointment Apply 1 application topically 2 (two) times daily as needed for dry skin. To face   [name] 100 MG Caps Generic drug: Droxidopa TAKE 1 CAPSULE BY MOUTH THREE TIMES DAILY. TAKE LAST DOSE AT LEAST 3 HOURS BEFORE BEDTIME.   ORAJEL DRY MOUTH MT Use as directed in the mouth or throat. Swish and spit 10 ml 4 x day as needed for sore gums. Continue until dentures fit and/or next dental visit. Four Times A Day - PRN   pantoprazole 40 MG tablet Commonly  known as: PROTONIX Take 40 mg by mouth daily.   Plecanatide 3 MG Tabs Commonly known as: Trulance Take 3 mg by mouth daily.   polyethylene glycol 17 g packet Commonly known as:  MIRALAX / GLYCOLAX Take 17 g by mouth daily as needed. Take 1-2 capfuls in 8 oz of H2O   pravastatin 40 MG tablet Commonly known as: PRAVACHOL Take 1 tablet (40 mg total) by mouth at bedtime.   rOPINIRole 0.25 MG tablet Commonly known as: REQUIP Take 0.5 mg by mouth 2 (two) times daily.   rOPINIRole 0.5 MG tablet Commonly known as: REQUIP Take 0.5 mg by mouth daily as needed.   sennosides-docusate sodium 8.6-50 MG tablet Commonly known as: SENOKOT-S Take 2 tablets by mouth 2 (two) times daily.   torsemide 10 MG tablet Commonly known as: DEMADEX Take 20 mg by mouth daily. 20 mg once a day   Vitamin D3 125 MCG (5000 UT) Caps Take 5,000 Units by mouth every Monday.       Review of Systems  Constitutional: Negative for activity change, appetite change and fever.       Weight loss #4Ibs in the past month  HENT: Positive for hearing loss. Negative for congestion and voice change.   Eyes: Negative for visual disturbance.  Respiratory: Positive for cough and shortness of breath.        Chronic DOE, cough  Cardiovascular: Positive for leg swelling. Negative for chest pain and palpitations.  Gastrointestinal: Negative for abdominal pain, constipation, nausea and vomiting.  Genitourinary: Positive for difficulty urinating and hematuria. Negative for dysuria.       Foley  Musculoskeletal: Positive for arthralgias, back pain and gait problem.       Right shoulder limited over head ROM with pain.  Positional lower back pain is chronic, chronic knee pain  Skin: Negative for color change.  Neurological: Negative for speech difficulty, weakness, light-headedness and headaches.       Memory lapses.   Psychiatric/Behavioral: Negative for behavioral problems and sleep disturbance.    Immunization History   Administered Date(s) Administered  . Influenza, High Dose Seasonal PF 11/01/2014, 11/13/2016, 11/19/2018, 11/19/2018  . Influenza,inj,Quad PF,6+ Mos 11/24/2017  . Influenza,inj,quad, With Preservative 11/17/2017  . Influenza-Unspecified 12/05/2013, 11/05/2014, 11/16/2015  . Moderna SARS-COVID-2 Vaccination 02/08/2019, 03/08/2019  . PPD Test 03/07/2014  . Pneumococcal Conjugate-13 10/05/2009  . Pneumococcal Polysaccharide-23 11/05/2005  . Pneumococcal-Unspecified 10/05/2009  . Tdap 08/02/2014  . Zoster 11/05/2005   Pertinent  Health Maintenance Due  Topic Date Due  . INFLUENZA VACCINE  09/05/2019  . DEXA SCAN  12/14/2024  . PNA vac Low Risk Adult  Completed   Fall Risk  10/09/2017 10/02/2016 10/30/2015 09/05/2015 02/02/2015  Falls in the past year? No No No No No  Risk for fall due to : - - Impaired balance/gait - -   Functional Status Survey:    Vitals:   08/16/19 1617  BP: 129/76  Pulse: 70  Resp: 16  Temp: 97.6 F (36.4 C)  SpO2: 97%  Weight: 178 lb 6.4 oz (80.9 kg)  Height: '5\' 10"'  (1.778 m)   Body mass index is 25.6 kg/m. Physical Exam Vitals and nursing note reviewed.  Constitutional:      Appearance: Normal appearance.  HENT:     Head: Normocephalic and atraumatic.     Mouth/Throat:     Mouth: Mucous membranes are moist.  Eyes:     Extraocular Movements: Extraocular movements intact.     Conjunctiva/sclera: Conjunctivae normal.     Pupils: Pupils are equal, round, and reactive to light.     Comments: Stye right lower eye lid  Cardiovascular:     Rate and Rhythm: Normal rate and regular rhythm.  Heart sounds: Murmur heard.      Comments: Left upper chest pace maker.  Pulmonary:     Breath sounds: No rales.  Abdominal:     General: Bowel sounds are normal.     Palpations: Abdomen is soft.     Tenderness: There is no abdominal tenderness. There is no right CVA tenderness, left CVA tenderness, guarding or rebound.  Genitourinary:    Comments: Foley,  dark blood urine in Foley catheter bag Musculoskeletal:     Cervical back: Normal range of motion and neck supple.     Right lower leg: Edema present.     Left lower leg: Edema present.     Comments: Trace edema BLE. Reduced over head ROM of the right shoulder.  Positional lower back pain remains no change.   Skin:    General: Skin is warm and dry.     Findings: Rash present. No bruising.     Comments: Seborrheic dermatitis face  Neurological:     General: No focal deficit present.     Mental Status: He is alert. Mental status is at baseline.     Gait: Gait abnormal.     Comments: Oriented to person, place.   Psychiatric:        Mood and Affect: Mood normal.        Behavior: Behavior normal.        Thought Content: Thought content normal.     Labs reviewed: Recent Labs    09/23/18 0000 03/15/19 0000  NA 141 139  K 4.1 3.9  CL  --  100  CO2  --  31*  BUN 18 20  CREATININE 0.7 0.8  CALCIUM  --  8.9   Recent Labs    09/23/18 0000 03/15/19 0000  AST 23 14  ALT 14 8*  ALKPHOS 64 75  ALBUMIN  --  3.5   Recent Labs    09/23/18 0000 03/15/19 0000  WBC 6.2 6.4  HGB 12.7* 12.3*  HCT 38* 36*  PLT 234 228   Lab Results  Component Value Date   TSH 1.71 03/15/2019   Lab Results  Component Value Date   HGBA1C 5.6 12/07/2015   Lab Results  Component Value Date   CHOL 93 03/30/2018   HDL 35 03/30/2018   LDLCALC 37 03/30/2018   TRIG 127 03/30/2018   CHOLHDL 2.6 05/05/2017    Significant Diagnostic Results in last 30 days:  CUP PACEART REMOTE DEVICE CHECK  Result Date: 08/10/2019 Scheduled remote reviewed. Normal device function.  Next remote 91 days- JBox, RN/CVRS   Assessment/Plan Urinary retention ED 08/14/19 for blood clot blocking Foley catheter for drainage, may use saline irrigation gently.   Hematuria Persisted, denied suprapubic pain/discomfort, will update CBC/diff, CMP/eGFR, UA C/S  Stye Right lower eye lid, warm compress 20 minutes/each  4x/day x 5 days.   RLS (restless legs syndrome) RLS, stable, continue Ropinirole 0.61m bid/prn      Depression, recurrent (HCC) His mood is stable, continue Duloxetine.   Chronic knee pain Lower back, knees, continue Tylenol, Robaxin.   Hypothyroidism TSH 1.71 2/821, continue Levothyroxine.   GERD (gastroesophageal reflux disease) Stable, continue Pantoprazole.   Chronic constipation Stable, continue Trulance, Senokot S, MiraLax.   Orthostatic hypotension stable, continue  Droxidopa 1034mtid,  Midodrine 73m54mid.     Combined congestive systolic and diastolic heart failure (HCC) Trace edema BLE, compensated otherwise, continue Torsemide.   Ventricular tachycardia (HCC) s/p PPP, heart rate is in control, continue  Amiodarone 149m qd.     Family/ staff Communication: plan of care reviewed with the patient and charge nurse.   Labs/tests ordered:  CBC/diff, CMP/eGFR, UA C/S  Time spend 35 minutes.

## 2019-08-16 NOTE — Assessment & Plan Note (Signed)
stable, continue  Droxidopa 100mg  tid,  Midodrine 5mg  tid.

## 2019-08-16 NOTE — Assessment & Plan Note (Signed)
Persisted, denied suprapubic pain/discomfort, will update CBC/diff, CMP/eGFR, UA C/S

## 2019-08-16 NOTE — Assessment & Plan Note (Signed)
Right lower eye lid, warm compress 20 minutes/each 4x/day x 5 days.

## 2019-08-16 NOTE — Assessment & Plan Note (Signed)
Lower back, knees, continue Tylenol, Robaxin.

## 2019-08-24 ENCOUNTER — Emergency Department (HOSPITAL_COMMUNITY)
Admission: EM | Admit: 2019-08-24 | Discharge: 2019-08-25 | Disposition: A | Discharge status: Home / Self Care | Payer: Medicare PPO | Source: Home / Self Care | Attending: Emergency Medicine | Admitting: Emergency Medicine

## 2019-08-24 ENCOUNTER — Other Ambulatory Visit: Payer: Self-pay

## 2019-08-24 ENCOUNTER — Encounter (HOSPITAL_COMMUNITY): Payer: Self-pay

## 2019-08-24 DIAGNOSIS — Z7982 Long term (current) use of aspirin: Secondary | ICD-10-CM | POA: Insufficient documentation

## 2019-08-24 DIAGNOSIS — Z955 Presence of coronary angioplasty implant and graft: Secondary | ICD-10-CM | POA: Insufficient documentation

## 2019-08-24 DIAGNOSIS — Z79899 Other long term (current) drug therapy: Secondary | ICD-10-CM | POA: Insufficient documentation

## 2019-08-24 DIAGNOSIS — R000 Tachycardia, unspecified: Secondary | ICD-10-CM | POA: Diagnosis not present

## 2019-08-24 DIAGNOSIS — R319 Hematuria, unspecified: Secondary | ICD-10-CM | POA: Insufficient documentation

## 2019-08-24 DIAGNOSIS — T83091A Other mechanical complication of indwelling urethral catheter, initial encounter: Secondary | ICD-10-CM | POA: Insufficient documentation

## 2019-08-24 DIAGNOSIS — I509 Heart failure, unspecified: Secondary | ICD-10-CM | POA: Insufficient documentation

## 2019-08-24 DIAGNOSIS — E876 Hypokalemia: Secondary | ICD-10-CM | POA: Insufficient documentation

## 2019-08-24 DIAGNOSIS — Y733 Surgical instruments, materials and gastroenterology and urology devices (including sutures) associated with adverse incidents: Secondary | ICD-10-CM | POA: Insufficient documentation

## 2019-08-24 DIAGNOSIS — I110 Hypertensive heart disease with heart failure: Secondary | ICD-10-CM | POA: Insufficient documentation

## 2019-08-24 DIAGNOSIS — Z950 Presence of cardiac pacemaker: Secondary | ICD-10-CM | POA: Insufficient documentation

## 2019-08-24 MED ORDER — LIDOCAINE HCL (CARDIAC) PF 100 MG/5ML IV SOSY
PREFILLED_SYRINGE | INTRAVENOUS | Status: AC
  Filled 2019-08-24: qty 5

## 2019-08-24 MED ORDER — LIDOCAINE HCL URETHRAL/MUCOSAL 2 % EX GEL: 1.0000 "application " | Freq: Once | CUTANEOUS | Status: DC

## 2019-08-24 NOTE — ED Triage Notes (Signed)
Pt arrived via [facility] from [facility] CC Abd pain and blood in cathter. Per EMS facility reports attempting to flush cath without resolution. Had similar problem on 7/10. Per EMS non ambulatory at baseline, and A/OX4. Pt denies new onset weakness or falls.    Hx CHF, UTI, CVA 3 years ago no physical deficits but does have dysphasia at baseline, pacemaker, GERD

## 2019-08-24 NOTE — ED Notes (Signed)
Updated pts daughter on status.

## 2019-08-25 ENCOUNTER — Inpatient Hospital Stay (HOSPITAL_COMMUNITY)
Admission: EM | Admit: 2019-08-25 | Discharge: 2019-09-03 | DRG: 308 | Disposition: A | Discharge status: Skilled Nursing Facility | Payer: Medicare PPO | Attending: Family Medicine | Admitting: Family Medicine

## 2019-08-25 ENCOUNTER — Encounter (HOSPITAL_COMMUNITY): Payer: Self-pay | Admitting: Emergency Medicine

## 2019-08-25 ENCOUNTER — Non-Acute Institutional Stay (SKILLED_NURSING_FACILITY): Payer: Medicare PPO | Admitting: Nurse Practitioner

## 2019-08-25 ENCOUNTER — Other Ambulatory Visit: Payer: Self-pay

## 2019-08-25 ENCOUNTER — Encounter: Payer: Self-pay | Admitting: Nurse Practitioner

## 2019-08-25 DIAGNOSIS — Z8249 Family history of ischemic heart disease and other diseases of the circulatory system: Secondary | ICD-10-CM

## 2019-08-25 DIAGNOSIS — Z801 Family history of malignant neoplasm of trachea, bronchus and lung: Secondary | ICD-10-CM

## 2019-08-25 DIAGNOSIS — I9589 Other hypotension: Secondary | ICD-10-CM | POA: Diagnosis present

## 2019-08-25 DIAGNOSIS — T83091A Other mechanical complication of indwelling urethral catheter, initial encounter: Secondary | ICD-10-CM | POA: Diagnosis present

## 2019-08-25 DIAGNOSIS — M810 Age-related osteoporosis without current pathological fracture: Secondary | ICD-10-CM | POA: Diagnosis present

## 2019-08-25 DIAGNOSIS — Z961 Presence of intraocular lens: Secondary | ICD-10-CM | POA: Diagnosis present

## 2019-08-25 DIAGNOSIS — Z882 Allergy status to sulfonamides status: Secondary | ICD-10-CM

## 2019-08-25 DIAGNOSIS — G2581 Restless legs syndrome: Secondary | ICD-10-CM

## 2019-08-25 DIAGNOSIS — R0682 Tachypnea, not elsewhere classified: Secondary | ICD-10-CM | POA: Diagnosis present

## 2019-08-25 DIAGNOSIS — I472 Ventricular tachycardia, unspecified: Secondary | ICD-10-CM

## 2019-08-25 DIAGNOSIS — E876 Hypokalemia: Secondary | ICD-10-CM | POA: Diagnosis not present

## 2019-08-25 DIAGNOSIS — F339 Major depressive disorder, recurrent, unspecified: Secondary | ICD-10-CM

## 2019-08-25 DIAGNOSIS — Y738 Miscellaneous gastroenterology and urology devices associated with adverse incidents, not elsewhere classified: Secondary | ICD-10-CM | POA: Diagnosis present

## 2019-08-25 DIAGNOSIS — I2510 Atherosclerotic heart disease of native coronary artery without angina pectoris: Secondary | ICD-10-CM

## 2019-08-25 DIAGNOSIS — Z79890 Hormone replacement therapy (postmenopausal): Secondary | ICD-10-CM

## 2019-08-25 DIAGNOSIS — F329 Major depressive disorder, single episode, unspecified: Secondary | ICD-10-CM | POA: Diagnosis present

## 2019-08-25 DIAGNOSIS — Z880 Allergy status to penicillin: Secondary | ICD-10-CM

## 2019-08-25 DIAGNOSIS — K5909 Other constipation: Secondary | ICD-10-CM

## 2019-08-25 DIAGNOSIS — Z7902 Long term (current) use of antithrombotics/antiplatelets: Secondary | ICD-10-CM

## 2019-08-25 DIAGNOSIS — I5042 Chronic combined systolic (congestive) and diastolic (congestive) heart failure: Secondary | ICD-10-CM | POA: Diagnosis present

## 2019-08-25 DIAGNOSIS — G9341 Metabolic encephalopathy: Secondary | ICD-10-CM | POA: Diagnosis present

## 2019-08-25 DIAGNOSIS — Z66 Do not resuscitate: Secondary | ICD-10-CM | POA: Diagnosis present

## 2019-08-25 DIAGNOSIS — Z20822 Contact with and (suspected) exposure to covid-19: Secondary | ICD-10-CM | POA: Diagnosis present

## 2019-08-25 DIAGNOSIS — I4729 Other ventricular tachycardia: Secondary | ICD-10-CM

## 2019-08-25 DIAGNOSIS — K219 Gastro-esophageal reflux disease without esophagitis: Secondary | ICD-10-CM | POA: Diagnosis present

## 2019-08-25 DIAGNOSIS — R000 Tachycardia, unspecified: Principal | ICD-10-CM | POA: Diagnosis present

## 2019-08-25 DIAGNOSIS — E039 Hypothyroidism, unspecified: Secondary | ICD-10-CM | POA: Diagnosis present

## 2019-08-25 DIAGNOSIS — Z8673 Personal history of transient ischemic attack (TIA), and cerebral infarction without residual deficits: Secondary | ICD-10-CM

## 2019-08-25 DIAGNOSIS — F419 Anxiety disorder, unspecified: Secondary | ICD-10-CM | POA: Diagnosis present

## 2019-08-25 DIAGNOSIS — N390 Urinary tract infection, site not specified: Secondary | ICD-10-CM

## 2019-08-25 DIAGNOSIS — D62 Acute posthemorrhagic anemia: Secondary | ICD-10-CM | POA: Diagnosis present

## 2019-08-25 DIAGNOSIS — R338 Other retention of urine: Secondary | ICD-10-CM | POA: Diagnosis present

## 2019-08-25 DIAGNOSIS — R6510 Systemic inflammatory response syndrome (SIRS) of non-infectious origin without acute organ dysfunction: Secondary | ICD-10-CM | POA: Diagnosis present

## 2019-08-25 DIAGNOSIS — I959 Hypotension, unspecified: Secondary | ICD-10-CM | POA: Diagnosis present

## 2019-08-25 DIAGNOSIS — G4700 Insomnia, unspecified: Secondary | ICD-10-CM | POA: Diagnosis present

## 2019-08-25 DIAGNOSIS — R339 Retention of urine, unspecified: Secondary | ICD-10-CM | POA: Diagnosis not present

## 2019-08-25 DIAGNOSIS — I252 Old myocardial infarction: Secondary | ICD-10-CM

## 2019-08-25 DIAGNOSIS — Z833 Family history of diabetes mellitus: Secondary | ICD-10-CM

## 2019-08-25 DIAGNOSIS — I5040 Unspecified combined systolic (congestive) and diastolic (congestive) heart failure: Secondary | ICD-10-CM

## 2019-08-25 DIAGNOSIS — E119 Type 2 diabetes mellitus without complications: Secondary | ICD-10-CM | POA: Diagnosis present

## 2019-08-25 DIAGNOSIS — Z85820 Personal history of malignant melanoma of skin: Secondary | ICD-10-CM

## 2019-08-25 DIAGNOSIS — Z9841 Cataract extraction status, right eye: Secondary | ICD-10-CM

## 2019-08-25 DIAGNOSIS — E7800 Pure hypercholesterolemia, unspecified: Secondary | ICD-10-CM | POA: Diagnosis present

## 2019-08-25 DIAGNOSIS — Z7189 Other specified counseling: Secondary | ICD-10-CM | POA: Diagnosis not present

## 2019-08-25 DIAGNOSIS — I951 Orthostatic hypotension: Secondary | ICD-10-CM

## 2019-08-25 DIAGNOSIS — R319 Hematuria, unspecified: Secondary | ICD-10-CM | POA: Diagnosis present

## 2019-08-25 DIAGNOSIS — N3289 Other specified disorders of bladder: Secondary | ICD-10-CM | POA: Diagnosis present

## 2019-08-25 DIAGNOSIS — Z951 Presence of aortocoronary bypass graft: Secondary | ICD-10-CM

## 2019-08-25 DIAGNOSIS — E785 Hyperlipidemia, unspecified: Secondary | ICD-10-CM | POA: Diagnosis present

## 2019-08-25 DIAGNOSIS — Z950 Presence of cardiac pacemaker: Secondary | ICD-10-CM | POA: Diagnosis present

## 2019-08-25 DIAGNOSIS — N179 Acute kidney failure, unspecified: Secondary | ICD-10-CM | POA: Diagnosis present

## 2019-08-25 DIAGNOSIS — Z9842 Cataract extraction status, left eye: Secondary | ICD-10-CM

## 2019-08-25 DIAGNOSIS — M159 Polyosteoarthritis, unspecified: Secondary | ICD-10-CM

## 2019-08-25 DIAGNOSIS — I255 Ischemic cardiomyopathy: Secondary | ICD-10-CM | POA: Diagnosis present

## 2019-08-25 DIAGNOSIS — Z955 Presence of coronary angioplasty implant and graft: Secondary | ICD-10-CM

## 2019-08-25 DIAGNOSIS — Z888 Allergy status to other drugs, medicaments and biological substances status: Secondary | ICD-10-CM

## 2019-08-25 DIAGNOSIS — E032 Hypothyroidism due to medicaments and other exogenous substances: Secondary | ICD-10-CM

## 2019-08-25 DIAGNOSIS — Z7982 Long term (current) use of aspirin: Secondary | ICD-10-CM

## 2019-08-25 DIAGNOSIS — K449 Diaphragmatic hernia without obstruction or gangrene: Secondary | ICD-10-CM | POA: Diagnosis present

## 2019-08-25 DIAGNOSIS — Z79899 Other long term (current) drug therapy: Secondary | ICD-10-CM

## 2019-08-25 DIAGNOSIS — I495 Sick sinus syndrome: Secondary | ICD-10-CM | POA: Diagnosis present

## 2019-08-25 DIAGNOSIS — S3722XA Contusion of bladder, initial encounter: Secondary | ICD-10-CM

## 2019-08-25 DIAGNOSIS — N029 Recurrent and persistent hematuria with unspecified morphologic changes: Secondary | ICD-10-CM | POA: Diagnosis present

## 2019-08-25 DIAGNOSIS — R310 Gross hematuria: Secondary | ICD-10-CM

## 2019-08-25 DIAGNOSIS — I110 Hypertensive heart disease with heart failure: Secondary | ICD-10-CM | POA: Diagnosis present

## 2019-08-25 DIAGNOSIS — Z515 Encounter for palliative care: Secondary | ICD-10-CM

## 2019-08-25 LAB — BASIC METABOLIC PANEL
Anion gap: 15 (ref 5–15)
BUN: 15 mg/dL (ref 8–23)
CO2: 26 mmol/L (ref 22–32)
Calcium: 9 mg/dL (ref 8.9–10.3)
Chloride: 99 mmol/L (ref 98–111)
Creatinine, Ser: 0.87 mg/dL (ref 0.61–1.24)
GFR calc Af Amer: >60 mL/min (ref >60)
GFR calc non Af Amer: >60 mL/min (ref >60)
Glucose, Bld: 125 mg/dL — ABNORMAL HIGH (ref 70–99)
Potassium: 2.9 mmol/L — ABNORMAL LOW (ref 3.5–5.1)
Sodium: 140 mmol/L (ref 135–145)

## 2019-08-25 LAB — CBC
HCT: 38.9 % — ABNORMAL LOW (ref 39.0–52.0)
Hemoglobin: 13 g/dL (ref 13.0–17.0)
MCH: 32.9 pg (ref 26.0–34.0)
MCHC: 33.4 g/dL (ref 30.0–36.0)
MCV: 98.5 fL (ref 80.0–100.0)
Platelets: 258 10*3/uL (ref 150–400)
RBC: 3.95 MIL/uL — ABNORMAL LOW (ref 4.22–5.81)
RDW: 14.3 % (ref 11.5–15.5)
WBC: 12.1 10*3/uL — ABNORMAL HIGH (ref 4.0–10.5)
nRBC: 0 % (ref 0.0–0.2)

## 2019-08-25 MED ORDER — POTASSIUM CHLORIDE ER 10 MEQ PO TBCR
20.0000 meq | EXTENDED_RELEASE_TABLET | Freq: Every day | ORAL | 0 refills | Status: DC
Start: 2019-08-25 — End: 2019-08-25

## 2019-08-25 NOTE — ED Triage Notes (Signed)
Pt BIB EMS from [facility] c/o hematuria. Pt was seen at [facility] on 7/20 for the same complaint. Endorses lower abd pain and penile pain. EMS describes urine as bright red. A&Ox4, non-ambulatory at baseline.

## 2019-08-25 NOTE — Progress Notes (Addendum)
Location:    [place] Room Number: 29 Place of Service:  SNF [phone]) Provider:  Marda Stalker, Lennie Odor NP  Adam Dad, MD  Patient Care Team: Adam Dad, MD as PCP - General (Internal Medicine) [name], MD as PCP - Cardiology (Cardiology) [name], MD as Attending Physician (Cardiology) Vevelyn Royals, MD as Consulting Physician (Ophthalmology) Franchot Gallo, MD as Consulting Physician (Urology) Tanda Rockers, MD as Consulting Physician (Pulmonary Disease) Allyn Kenner, MD (Dermatology) Deliah Goody, PA-C as Physician Assistant (Physician Assistant) Chistopher Mangino X, NP as Nurse Practitioner (Internal Medicine) Ngetich, Nelda Bucks, NP as Nurse Practitioner (Family Medicine)  Extended Emergency Contact Information Primary Emergency Contact: Adam [name] of Adam [name] Phone: [phone] Relation: Daughter Secondary Emergency Contact: [name],Adam S Address: [address]          [place] Phone: [phone] Mobile Phone: [phone] Relation: Spouse  Code Status:  Full Code Goals of care: Advanced Directive information Advanced Directives 08/26/2019  Does Patient Have a Medical Advance Directive? Yes  Type of Advance Directive Out of facility DNR (pink MOST or yellow form)  Does patient want to make changes to medical advance directive? No - Patient declined  Copy of [place] in Chart? -  Would patient like information on creating a medical advance directive? -  Pre-existing out of facility DNR order (yellow form or pink MOST form) -     Chief Complaint  Patient presents with  . Acute Visit    Follow up E/D hematuria    HPI:  Pt is a 86 y.o. male seen today for an acute visit for recurrent hematuria, lower abd pain, US showed urinary retention,  chronic  Indwelling Foley in place, subsequently, a  3 way catheter placed with irrigation, improved, dc SNF  [facility] 08/24/19, as same  08/11/19. Hypokalemia, supplemented, needs f/u BMP  Hx of Afib/SSS s/p St Jude PPM 2010, ventricular tachycardia, takes Amiodarone,    Depression, takes Duloxetine 20mg  qd  CAD, CABG x4, stents, takes ASA  CHF, takes Torsemide  OA knee pain, left lower back, takes tylenol, Robaxin.   GERD takes Pantoprazole  Hypothyroidism, takes Levothyroxine.   Orthostatic hypotension tales Droxidopa, Midodrine.   RLS, takes Ropioirole.   Constipation, takes Trulance, Senokot S, MiraLax,      Past Medical History:  Diagnosis Date  . Arthritis    "minor, back and sometimes knees" (12/15/2012)  . Bradycardia    SSS s/p St Jude PPM 04/12/2008  . CAD (coronary artery disease) 12/30/2014   CABG (LIMA-LAD, SVG-RCA, SVG-OM in 1996).  07/2009 BMS to SVG-RCA. Cath in 04/2010 with patent stents   . Cardiomyopathy, ischemic 08/25/2012  . Chronic knee pain 12/03/2014  . Combined congestive systolic and diastolic heart failure ([place]) 02/02/2015   Hx EF 41%. BNP 96.8 02/21/15 Torsemide 04/06/15 Na 142, K 4.6, Bun 16, creat 0.89 04/20/14 BNP 111.7, Na 142, K 4.6, Bun 16, creat 0.9   . Depression with anxiety 02/02/2015   02/21/15 Hgb A1c 5.8 03/10/15 MMSE 30/30   . Dizziness, after diuretic asscoiated with hypotension and responded to fluid bolus 06/05/2011   04/28/15 US carotid R+L normal bilateral arterial velocities.    Marland Kitchen Dyspnea 08/11/2014   Followed in Pulmonary clinic/ Adam [name]/ Adam [name]  - 08/11/2014  Walked RA x 1 laps @ 185 ft each stopped due to fatigue/off balance/ slow pace/  no sob or desat  - PFT's  09/26/2014  FEV1 2.26 (85 % ) ratio 76  p no % improvement from saba with DLCO  67 % corrects to 93 % for alv volume      Since prev study 08/04/13 minimal change lung vol or dlco    . Embolic cerebral infarction ([place]) 12/06/2015  . GERD (gastroesophageal reflux disease)   . Gout 02/09/2015  . Hiatal hernia   . Hyperlipidemia   . Hypertension   . Hypothyroid   . Influenza A 03/10/2016  . Insomnia    . Melanoma of back (Adam [name]) 1976  . Myocardial infarction Adam [name]) 1996; 2011   "both silent" (12/15/2012)  . Nonrheumatic aortic valve stenosis   . Orthostatic hypotension   . Osteoporosis, senile   . Pacemaker   . RBBB   . Restless leg 02/02/2015  . Right leg weakness 12/06/2015  . [name] [place] spotted fever   . S/P CABG x 4   . Sick sinus syndrome (Adam [name]) 01/31/2014  . Sustained ventricular tachycardia (Adam [name]) 07/27/2014  . Urinary retention 12/30/2014   has a cathrater   Past Surgical History:  Procedure Laterality Date  . CARDIAC CATHETERIZATION  04/2010   LIMA to LAD patent,SVG to OM patent,no in-stnet restenosis RCA  . CATARACT EXTRACTION W/ INTRAOCULAR LENS  IMPLANT, BILATERAL Bilateral 2012  . CORONARY ANGIOPLASTY WITH STENT PLACEMENT  07/2009   bare metal stent to SVG to the RCA  . CORONARY ARTERY BYPASS GRAFT  1996   LIMA to LAD,SVG to RCA & SVG to OM  . INSERT / REPLACE / REMOVE PACEMAKER  2010  . MELANOMA EXCISION  05/1974 X2   "taken off my back" (12/15/2012)  . NM MYOVIEW LTD  06/2011   low risk  . PPM GENERATOR CHANGEOUT N/A 01/22/2017   Procedure: PPM GENERATOR CHANGEOUT;  Surgeon: [name], MD;  Location: [facility];  Service: Cardiovascular;  Laterality: N/A;  . TONSILLECTOMY  1938  . TRANSURETHRAL RESECTION OF PROSTATE  1986  . US ECHOCARDIOGRAPHY  07/11/2009   EF 45-50%    Allergies  Allergen Reactions  . Altace [Ramipril] Cough  . Crestor [Rosuvastatin Calcium] Rash  . Penicillins Rash and Other (See Comments)    Has patient had a PCN reaction causing immediate rash, facial/tongue/throat swelling, SOB or lightheadedness with hypotension: Yes Has patient had a PCN reaction causing severe rash involving mucus membranes or skin necrosis: No Has patient had a PCN reaction that required hospitalization No Has patient had a PCN reaction occurring within the last 10 years: No If all of the above answers are "NO", then may proceed with Cephalosporin  use.   . Sulfa Antibiotics Rash  . Sulfamethoxazole-Trimethoprim Rash    Allergies as of 08/25/2019      Reactions   Altace [ramipril] Cough   Crestor [rosuvastatin Calcium] Rash   Penicillins Rash, Other (See Comments)   Has patient had a PCN reaction causing immediate rash, facial/tongue/throat swelling, SOB or lightheadedness with hypotension: Yes Has patient had a PCN reaction causing severe rash involving mucus membranes or skin necrosis: No Has patient had a PCN reaction that required hospitalization No Has patient had a PCN reaction occurring within the last 10 years: No If all of the above answers are "NO", then may proceed with Cephalosporin use.   Sulfa Antibiotics Rash   Sulfamethoxazole-trimethoprim Rash      Medication List       Accurate as of August 25, 2019 11:34 PM. If you have any questions, ask your nurse or doctor.  STOP taking these medications   potassium chloride 10 MEQ tablet Commonly known as: KLOR-CON     TAKE these medications   acetaminophen 500 MG tablet Commonly known as: TYLENOL Take 500 mg by mouth 3 (three) times daily.   amiodarone 100 MG tablet Commonly known as: PACERONE Take 1 tablet (100 mg total) by mouth daily.   aspirin EC 81 MG tablet Take 1 tablet (81 mg total) by mouth daily.   clotrimazole-betamethasone cream Commonly known as: LOTRISONE Apply 1 application topically daily as needed.   DULoxetine 20 MG capsule Commonly known as: CYMBALTA Take 20 mg by mouth daily. For anxiety,insomnia and back pain   feeding supplement (BOOST BREEZE) Liqd Take by mouth 2 (two) times daily.   feeding supplement (PRO-STAT SUGAR FREE 64) Liqd Take 30 mLs by mouth daily.   fluticasone 50 MCG/ACT nasal spray Commonly known as: FLONASE Place 1 spray into both nostrils 2 (two) times daily.   guaiFENesin 100 MG/5ML Soln Commonly known as: ROBITUSSIN Take 5 mLs (100 mg total) by mouth every 4 (four) hours as needed for cough or to  loosen phlegm.   ketoconazole 2 % cream Commonly known as: NIZORAL Apply 1 application topically as needed for irritation. Apply small amount behind ears and in creases beside nose BID x 7 days. MAY REPEAT PRN. (Family supplied).   levothyroxine 100 MCG tablet Commonly known as: SYNTHROID Take 100 mcg by mouth daily before breakfast.   meclizine 25 MG tablet Commonly known as: ANTIVERT Take 1 tablet (25 mg total) by mouth 2 (two) times daily as needed for dizziness.   melatonin 3 MG Tabs tablet Take 3 mg by mouth at bedtime.   methocarbamol 500 MG tablet Commonly known as: ROBAXIN Take 250 mg by mouth 2 (two) times daily as needed for muscle spasms.   midodrine 5 MG tablet Commonly known as: PROAMATINE Take 5 mg by mouth 3 (three) times daily with meals. Notify provider if BP is < 90/60.   mineral oil-hydrophilic petrolatum ointment Apply 1 application topically 2 (two) times daily as needed for dry skin. To face   [name] 100 MG Caps Generic drug: Droxidopa TAKE 1 CAPSULE BY MOUTH THREE TIMES DAILY. TAKE LAST DOSE AT LEAST 3 HOURS BEFORE BEDTIME.   ORAJEL DRY MOUTH MT Use as directed in the mouth or throat. Swish and spit 10 ml 4 x day as needed for sore gums. Continue until dentures fit and/or next dental visit. Four Times A Day - PRN   pantoprazole 40 MG tablet Commonly known as: PROTONIX Take 40 mg by mouth daily.   Plecanatide 3 MG Tabs Commonly known as: Trulance Take 3 mg by mouth daily.   polyethylene glycol 17 g packet Commonly known as: MIRALAX / GLYCOLAX Take 17 g by mouth daily as needed for mild constipation. Take 1-2 capfuls in 8 oz of H2O   pravastatin 40 MG tablet Commonly known as: PRAVACHOL Take 1 tablet (40 mg total) by mouth at bedtime.   rOPINIRole 0.25 MG tablet Commonly known as: REQUIP Take 0.5 mg by mouth 2 (two) times daily.   rOPINIRole 0.5 MG tablet Commonly known as: REQUIP Take 0.5 mg by mouth daily as needed.     sennosides-docusate sodium 8.6-50 MG tablet Commonly known as: SENOKOT-S Take 2 tablets by mouth 2 (two) times daily.   torsemide 10 MG tablet Commonly known as: DEMADEX Take 20 mg by mouth daily. 20 mg once a day   Vitamin D3 125 MCG (5000 UT)  Caps Take 5,000 Units by mouth every Monday.       Review of Systems  Constitutional: Negative for appetite change, fatigue and fever.       Weight loss #4Ibs in the past month  HENT: Positive for hearing loss. Negative for congestion and voice change.   Eyes: Negative for visual disturbance.  Respiratory: Positive for cough and shortness of breath.        Chronic DOE, cough  Cardiovascular: Positive for leg swelling. Negative for chest pain and palpitations.  Gastrointestinal: Negative for abdominal pain, constipation, nausea and vomiting.  Genitourinary: Positive for difficulty urinating and hematuria. Negative for dysuria.       Foley  Musculoskeletal: Positive for arthralgias, back pain and gait problem.       Right shoulder limited over head ROM with pain.  Positional lower back pain is chronic, chronic knee pain  Skin: Negative for color change.  Neurological: Negative for dizziness, facial asymmetry, speech difficulty and weakness.       Memory lapses. Mild expressive aphasia.   Psychiatric/Behavioral: Negative for behavioral problems and sleep disturbance. The patient is not nervous/anxious.     Immunization History  Administered Date(s) Administered  . Influenza, High Dose Seasonal PF 11/01/2014, 11/13/2016, 11/19/2018, 11/19/2018  . Influenza,inj,Quad PF,6+ Mos 11/24/2017  . Influenza,inj,quad, With Preservative 11/17/2017  . Influenza-Unspecified 12/05/2013, 11/05/2014, 11/16/2015  . Moderna SARS-COVID-2 Vaccination 02/08/2019, 03/08/2019  . PPD Test 03/07/2014  . Pneumococcal Conjugate-13 10/05/2009  . Pneumococcal Polysaccharide-23 11/05/2005  . Pneumococcal-Unspecified 10/05/2009  . Tdap 08/02/2014  . Zoster  11/05/2005   Pertinent  Health Maintenance Due  Topic Date Due  . INFLUENZA VACCINE  09/05/2019  . DEXA SCAN  12/14/2024  . PNA vac Low Risk Adult  Completed   Fall Risk  10/09/2017 10/02/2016 10/30/2015 09/05/2015 02/02/2015  Falls in the past year? No No No No No  Risk for fall due to : - - Impaired balance/gait - -   Functional Status Survey:    Vitals:   08/25/19 1421  BP: 112/68  Pulse: 73  Resp: 20  Temp: (!) 97.3 F (36.3 C)  SpO2: 97%  Weight: 175 lb 3.2 oz (79.5 kg)  Height: 5\' 10"  (1.778 m)   Body mass index is 25.14 kg/m. Physical Exam Vitals and nursing note reviewed.  Constitutional:      Appearance: Normal appearance.  HENT:     Head: Normocephalic and atraumatic.     Nose: Nose normal.     Mouth/Throat:     Mouth: Mucous membranes are moist.  Eyes:     Extraocular Movements: Extraocular movements intact.     Conjunctiva/sclera: Conjunctivae normal.     Pupils: Pupils are equal, round, and reactive to light.     Comments: Stye right lower eye lid  Cardiovascular:     Rate and Rhythm: Normal rate and regular rhythm.     Heart sounds: Murmur heard.      Comments: Left upper chest pace maker.  Pulmonary:     Breath sounds: No rales.  Abdominal:     General: Bowel sounds are normal.     Palpations: Abdomen is soft.     Tenderness: There is no abdominal tenderness. There is no right CVA tenderness, left CVA tenderness, guarding or rebound.  Genitourinary:    Comments: Foley, dark blood urine in Foley catheter bag Musculoskeletal:     Cervical back: Normal range of motion and neck supple.     Right lower leg: Edema present.  Left lower leg: Edema present.     Comments: Trace edema BLE. Reduced over head ROM of the right shoulder.  Positional lower back pain remains no change.   Skin:    General: Skin is warm and dry.     Findings: No bruising.  Neurological:     General: No focal deficit present.     Mental Status: He is alert. Mental status is at  baseline.     Gait: Gait abnormal.     Comments: Oriented to person, place.   Psychiatric:        Mood and Affect: Mood normal.        Behavior: Behavior normal.        Thought Content: Thought content normal.     Labs reviewed: Recent Labs    08/27/19 0226 08/28/19 0214 08/29/19 0233 08/30/19 0253 08/31/19 0205  NA 139   < > 137 135 137  K 5.0   < > 3.7 3.1* 3.6  CL 103   < > 102 99 102  CO2 23   < > 24 27 25   GLUCOSE 133*   < > 98 104* 114*  BUN 25*   < > 16 15 13   CREATININE 0.96   < > 0.77 0.72 0.65  CALCIUM 8.5*   < > 8.1* 7.7* 8.4*  MG 2.6*  --  2.7*  --  2.3  PHOS  --   --  2.1* 3.1 3.0   < > = values in this interval not displayed.   Recent Labs    03/15/19 0000 08/27/19 0226 08/29/19 0233  AST 14 20 33  ALT 8* 12 18  ALKPHOS 75 53 55  BILITOT  --  0.8 0.7  PROT  --  5.7* 6.1*  ALBUMIN 3.5 2.7* 2.6*   Recent Labs    08/26/19 0111 08/26/19 0127 08/29/19 0233 08/30/19 0253 08/31/19 0205  WBC 17.2*   < > 10.8* 8.5 8.9  NEUTROABS 13.2*  --   --   --   --   HGB 12.4*   < > 9.6* 9.8* 10.3*  HCT 37.2*   < > 29.2* 30.5* 32.1*  MCV 99.2   < > 95.7 95.9 98.5  PLT 303   < > 206 244 218   < > = values in this interval not displayed.   Lab Results  Component Value Date   TSH 1.71 03/15/2019   Lab Results  Component Value Date   HGBA1C 5.8 (H) 08/26/2019   Lab Results  Component Value Date   CHOL 93 03/30/2018   HDL 35 03/30/2018   LDLCALC 37 03/30/2018   TRIG 127 03/30/2018   CHOLHDL 2.6 05/05/2017    Significant Diagnostic Results in last 30 days:  CT HEAD WO CONTRAST  Result Date: 08/26/2019 CLINICAL DATA:  Altered mental status. EXAM: CT HEAD WITHOUT CONTRAST TECHNIQUE: Contiguous axial images were obtained from the base of the skull through the vertex without intravenous contrast. COMPARISON:  January 11, 2016. FINDINGS: Brain: Mild diffuse cortical atrophy is noted. Minimal chronic ischemic white matter disease is noted. No mass effect or  midline shift is noted. Ventricular size is within normal limits. There is no evidence of mass lesion, hemorrhage or acute infarction. Vascular: No hyperdense vessel or unexpected calcification. Skull: Normal. Negative for fracture or focal lesion. Sinuses/Orbits: No acute finding. Other: None. IMPRESSION: Mild diffuse cortical atrophy. Minimal chronic ischemic white matter disease. No acute intracranial abnormality seen. Electronically Signed   By: Jeneen Rinks  Murlean Caller M.D.   On: 08/26/2019 14:50   US PELVIS LIMITED (TRANSABDOMINAL ONLY)  Result Date: 08/29/2019 CLINICAL DATA:  Hematuria. EXAM: LIMITED ULTRASOUND OF PELVIS TECHNIQUE: Limited transabdominal ultrasound examination of the pelvis was performed. COMPARISON:  None. FINDINGS: Examination of the pelvis performed for evaluation of the bladder due to persistent clot/hematuria. Foley catheter is present within the bladder as the bladder is empty and therefore difficult to evaluate. No other focal abnormality identified. IMPRESSION: Foley catheter present within a decompressed bladder. No focal abnormality visualized. Electronically Signed   By: Marin Olp M.D.   On: 08/29/2019 09:48   DG Chest Portable 1 View  Result Date: 08/26/2019 CLINICAL DATA:  History of pain. EXAM: PORTABLE CHEST 1 VIEW COMPARISON:  CT abdomen 01/18/2018.  Chest x-ray 01/18/2018. FINDINGS: Cardiac pacer in stable position. Prior CABG. Stable cardiomegaly. No pulmonary venous congestion. Large hiatal hernia again noted. No focal infiltrate noted. No pleural effusion or pneumothorax. IMPRESSION: 1. Cardiac pacer stable position. Prior CABG. Stable cardiomegaly. No acute abnormality identified. Chest is stable from prior exam. 2.  Large hiatal hernia again noted. Electronically Signed   By: Marcello Moores  Register   On: 08/26/2019 06:18   ECHOCARDIOGRAM COMPLETE  Result Date: 08/26/2019    ECHOCARDIOGRAM REPORT   Patient Name:   Adam [name] Date of Exam: 08/26/2019 Medical Rec #:   [id number]     Height:       70.0 in Accession #:    [id number]    Weight:       185.4 lb Date of Birth:  January 02, 1927     BSA:          2.021 m Patient Age:    86 years      BP:           105/85 mmHg Patient Gender: M             HR:           70 bpm. Exam Location:  Inpatient Procedure: 2D Echo, Cardiac Doppler, Color Doppler and Intracardiac            Opacification Agent                     STAT ECHO Reported to: Dr Gardiner Rhyme on 08/26/2019 12:18:00 PM. Indications:    R94.31 Abnormal EKG  History:        Patient has prior history of Echocardiogram examinations, most                 recent 09/02/2017. CHF and Cardiomyopathy, Previous Myocardial                 Infarction and CAD, Pacemaker and Prior CABG, Stroke, Aortic                 Valve Disease, Arrythmias:RBBB, Signs/Symptoms:Dyspnea; Risk                 Factors:Hypertension, Dyslipidemia and GERD. Sick Sinus                 Syndrome.  Sonographer:    Tiffany Dance Referring Phys: [id number] Harold Hedge  Sonographer Comments: No subcostal window. IMPRESSIONS  1. Left ventricular ejection fraction, by estimation, is 40 to 45%. The left ventricle has mildly decreased function. The left ventricle demonstrates regional wall motion abnormalities (see scoring diagram/findings for description). Basal to mid inferior/inferolateral akinesis. There is severe asymmetric left ventricular hypertrophy of the basal-septal segment. Left ventricular diastolic parameters  are indeterminate.  2. Right ventricular systolic function is mildly reduced. The right ventricular size is mildly enlarged. Tricuspid regurgitation signal is inadequate for assessing PA pressure.  3. The mitral valve is abnormal. Moderately calcified leaflets. No evidence of mitral valve regurgitation.  4. Aortic dilatation noted. There is mild dilatation of the ascending aorta measuring 38 mm.  5. The aortic valve is abnormal. Severely calcified. Aortic valve regurgitation is mild. Severe aortic valve stenosis.  Vmax 3.7 m/s, MG 35 mmHg, AVA 0.5 cm^2, DI 0.13. FINDINGS  Left Ventricle: Left ventricular ejection fraction, by estimation, is 40 to 45%. The left ventricle has mildly decreased function. The left ventricle demonstrates regional wall motion abnormalities. Definity contrast agent was given IV to delineate the left ventricular endocardial borders. The left ventricular internal cavity size was normal in size. There is severe asymmetric left ventricular hypertrophy of the basal-septal segment. Left ventricular diastolic parameters are indeterminate.  LV Wall Scoring: The inferior wall and posterior wall are akinetic. The mid inferoseptal segment and basal inferoseptal segment are hypokinetic. The entire anterior wall, antero-lateral wall, entire anterior septum, and entire apex are normal. Right Ventricle: The right ventricular size is mildly enlarged. Right vetricular wall thickness was not assessed. Right ventricular systolic function is mildly reduced. Tricuspid regurgitation signal is inadequate for assessing PA pressure. Left Atrium: Left atrial size was normal in size. Right Atrium: Right atrial size was normal in size. Pericardium: Trivial pericardial effusion is present. Mitral Valve: The mitral valve is abnormal. There is moderate calcification of the mitral valve leaflet(s). No evidence of mitral valve regurgitation. Tricuspid Valve: The tricuspid valve is grossly normal. Tricuspid valve regurgitation is trivial. Aortic Valve: The aortic valve is abnormal. Aortic valve regurgitation is mild. Aortic regurgitation PHT measures 365 msec. Severe aortic stenosis is present. There is severe calcifcation of the aortic valve. Aortic valve mean gradient measures 32.2 mmHg. Aortic valve peak gradient measures 50.8 mmHg. Aortic valve area, by VTI measures 0.56 cm. Pulmonic Valve: The pulmonic valve was not well visualized. Pulmonic valve regurgitation is not visualized. Aorta: The aortic root is normal in size and  structure and aortic dilatation noted. There is mild dilatation of the ascending aorta measuring 38 mm. IAS/Shunts: The interatrial septum was not well visualized.  LEFT VENTRICLE PLAX 2D LVIDd:         4.24 cm LVIDs:         3.65 cm LV PW:         1.47 cm LV IVS:        1.42 cm LVOT diam:     2.40 cm LV SV:         45 LV SV Index:   22 LVOT Area:     4.52 cm  RIGHT VENTRICLE RV Basal diam:  3.35 cm RV Mid diam:    2.18 cm RV S prime:     6.41 cm/s TAPSE (M-mode): 1.5 cm LEFT ATRIUM             Index       RIGHT ATRIUM           Index LA diam:        3.00 cm 1.48 cm/m  RA Area:     19.40 cm LA Vol (A2C):   70.7 ml 34.98 ml/m RA Volume:   53.60 ml  26.52 ml/m LA Vol (A4C):   58.8 ml 29.09 ml/m LA Biplane Vol: 64.9 ml 32.11 ml/m  AORTIC VALVE AV Area (Vmax):    0.65  cm AV Area (Vmean):   0.62 cm AV Area (VTI):     0.56 cm AV Vmax:           356.20 cm/s AV Vmean:          266.600 cm/s AV VTI:            0.804 m AV Peak Grad:      50.8 mmHg AV Mean Grad:      32.2 mmHg LVOT Vmax:         51.30 cm/s LVOT Vmean:        36.400 cm/s LVOT VTI:          0.099 m LVOT/AV VTI ratio: 0.12 AI PHT:            365 msec  AORTA Ao Root diam: 3.40 cm Ao Asc diam:  3.80 cm MITRAL VALVE MV Area (PHT): 3.37 cm    SHUNTS MV Decel Time: 225 msec    Systemic VTI:  0.10 m MV E velocity: 69.00 cm/s  Systemic Diam: 2.40 cm MV A velocity: 62.90 cm/s MV E/A ratio:  1.10 Oswaldo Milian MD Electronically signed by Oswaldo Milian MD Signature Date/Time: 08/26/2019/1:19:31 PM    Final    CT Renal Stone Study  Result Date: 08/26/2019 CLINICAL DATA:  Hematuria.  Lower abdominal pain. EXAM: CT ABDOMEN AND PELVIS WITHOUT CONTRAST TECHNIQUE: Multidetector CT imaging of the abdomen and pelvis was performed following the standard protocol without IV contrast. COMPARISON:  01/18/2018 FINDINGS: Lower chest: A large hiatal hernia is again partially visualized with the majority of the stomach being located in the chest. There is  associated compressive atelectasis in the left lower lobe. Trace left pleural effusion, smaller than on the prior study. Partially visualized pacemaker leads. Hepatobiliary: Subcentimeter hypodensity in the left hepatic lobe, too small to fully characterize. Unremarkable gallbladder. No biliary dilatation. Pancreas: Unremarkable. Spleen: Unremarkable. Adrenals/Urinary Tract: Unremarkable right adrenal gland. Unchanged slight left adrenal nodularity. Left renal calculi measuring up to 5 mm in the lower pole. Vascular calcifications in the right renal hilum. Left renal cysts including an unchanged 5.1 cm cyst in the lower pole. Mild bilateral hydronephrosis and right hydroureter. Moderate bladder distension. 11 cm hyperdense mass dependently in the bladder with a small amount of gas likely reflecting a large hematoma. Foley catheter in place. Stomach/Bowel: There is no evidence of bowel obstruction or inflammation. 1 cm metallic density at the base of the cecum. Vascular/Lymphatic: Abdominal aortic atherosclerosis. Unchanged focal saccular dilatation of the infrarenal abdominal aorta with transverse diameter of 2.5 cm. No enlarged lymph nodes. Reproductive: Unremarkable prostate. Other: Mild nonspecific stranding adjacent to the bladder and in the presacral region. No fluid collection or pneumoperitoneum. Musculoskeletal: Chronic T11 compression fracture with progressive severe vertebral body height loss. Unchanged chronic L3 compression fracture with mild height loss. IMPRESSION: 1. Large bladder hematoma with Foley catheter in place. Stranding about the bladder could indicate hemorrhagic cystitis. An underlying mass is not excluded. Consider urology consultation. 2. Mild bilateral hydronephrosis. 3. Nonobstructing left nephrolithiasis. 4. Large hiatal hernia. 5. Aortic Atherosclerosis (ICD10-I70.0). Electronically Signed   By: Logan Bores M.D.   On: 08/26/2019 04:38   CUP PACEART REMOTE DEVICE CHECK  Result  Date: 08/10/2019 Scheduled remote reviewed. Normal device function.  Next remote 91 days- JBox, RN/CVRS   Assessment/Plan Advanced care planning/counseling discussion Social worker, MOST DNR, full scope of treatment, transfer to [name]/IVF/ABT as indicated, feeding tube for a defined period. Time spend 25 minutes.   Urinary retention  Recurrent obstruction of Foley, hematuria, f/u Urology ASAP  Hypokalemia Supplemented in ED, needs Kcl and f/u BMP  RLS (restless legs syndrome) Stable, continue Requip.   Depression, recurrent ([place]) His mood is stable, continue Duloxetine.   Osteoarthritis Multiple sides, takes Tylenol, Methocarbamol for pain control.   Hypothyroidism TSH 1.71 03/15/19, continue Levothyroxine.   GERD (gastroesophageal reflux disease) Stable, continue Pantoprazole.   Chronic constipation Chronic issue, controlled, continue Trulance, Senokot S, MiraLax.   Ventricular tachycardia (HCC) Heart rate is in control, continue Amiodarone.   Orthostatic hypotension Continue Droxidopa, Midodrine   Combined congestive systolic and diastolic heart failure (HCC) Chronic DOE, underwent pulmonology eval, f/u cardiology, continue Torsemide   Coronary artery disease involving native coronary artery of native heart without angina pectoris S/p CABG x4, continue ASA    Family/ staff Communication: plan of care reviewed with patient, the patient's HPOA, and charge nurse.   Labs/tests ordered:  F/u BMP

## 2019-08-25 NOTE — ED Provider Notes (Signed)
[facility] DEPT Provider Note   CSN: [id number] Arrival date & time: 08/24/19  2201     History Chief Complaint  Patient presents with  . Hematuria    Adam [name] is a 86 y.o. male.  HPI Patient presents with blood in his Foley catheter.  Reportedly cannot flush it from friends home last.  Recent visit to the ER for same.  States he has lower abdominal pain.  No fevers or chills.  Has chronic indwelling Foley catheter.  Not on anticoagulation.  Dull lower abdominal pain.  States he feels bad because of it.    Past Medical History:  Diagnosis Date  . Arthritis    "minor, back and sometimes knees" (12/15/2012)  . Bradycardia    AFib/SSS s/p St Jude PPM 04/12/2008  . CAD (coronary artery disease) 12/30/2014   CABG (LIMA-LAD, SVG-RCA, SVG-OM in 1996).  07/2009 BMS to SVG-RCA. Cath in 04/2010 with patent stents   . Cardiomyopathy, ischemic 08/25/2012  . CHF (congestive heart failure) ([place])   . Chronic knee pain 12/03/2014  . Combined congestive systolic and diastolic heart failure ([place]) 02/02/2015   Hx EF 41%. BNP 96.8 02/21/15 Torsemide 04/06/15 Na 142, K 4.6, Bun 16, creat 0.89 04/20/14 BNP 111.7, Na 142, K 4.6, Bun 16, creat 0.9   . Depression with anxiety 02/02/2015   02/21/15 Hgb A1c 5.8 03/10/15 MMSE 30/30   . Dizziness, after diuretic asscoiated with hypotension and responded to fluid bolus 06/05/2011   04/28/15 US carotid R+L normal bilateral arterial velocities.    [facility] Dyspnea 08/11/2014   Followed in Pulmonary clinic/  Healthcare/ [facility]  - 08/11/2014  Walked RA x 1 laps @ 185 ft each stopped due to fatigue/off balance/ slow pace/  no sob or desat  - PFT's  09/26/2014  FEV1 2.26 (85 % ) ratio 76  p no % improvement from saba with DLCO  67 % corrects to 93 % for alv volume      Since prev study 08/04/13 minimal change lung vol or dlco    . Embolic cerebral infarction ([place]) 12/06/2015  . Exertional shortness of breath    "sometimes walking" (12/15/2012)  .  GERD (gastroesophageal reflux disease)   . Gout 02/09/2015  . Heart murmur    "just told I had one today" (12/15/2012)  . Hiatal hernia   . Hyperlipidemia   . Hypertension   . Hypothyroid   . Influenza A 03/10/2016  . Insomnia   . Melanoma of back ([place]) 1976  . Myocardial infarction [facility]) 1996; 2011   "both silent" (12/15/2012)  . Nonrheumatic aortic valve stenosis   . Orthostatic hypotension   . Osteoporosis, senile   . Pacemaker   . RBBB   . Restless leg 02/02/2015  . Right leg weakness 12/06/2015  . [facility] spotted fever   . S/P CABG x 4   . Sick sinus syndrome ([place]) 01/31/2014  . Sustained ventricular tachycardia ([place]) 07/27/2014  . Urinary retention 12/30/2014   has a cathrater    Patient Active Problem List   Diagnosis Date Noted  . Hematuria 08/16/2019  . Allergic rhinitis 05/21/2019  . Weight loss 04/06/2019  . Seborrheic dermatitis 04/06/2019  . Edema 10/16/2018  . Stye 10/13/2018  . Ecchymosis of eye 10/13/2018  . T11 vertebral fracture ([place]) 02/13/2018  . L3 vertebral fracture ([place]) 02/13/2018  . Bilateral renal cysts 02/13/2018  . Spondylosis of cervical region without myelopathy or radiculopathy 09/25/2017  . Tear of left rotator  cuff 09/25/2017  . Pharyngeal dysphagia 09/08/2017  . Generalized weakness 08/25/2017  . Right shoulder pain 08/25/2017  . Neurogenic bladder 07/21/2017  . Mild cognitive impairment 07/21/2017  . Neurogenic orthostatic hypotension ( [place]) 04/21/2017  . Osteoarthritis 04/23/2016  . Gingivitis 04/15/2016  . History of arterial ischemic stroke 03/05/2016  . Aortic valve stenosis, nonrheumatic   . TIA (transient ischemic attack) 01/10/2016  . Hx of CABG 01/10/2016  . Insomnia 03/23/2015  . Gout 02/09/2015  . Depression, recurrent ([place]) 02/02/2015  . RLS (restless legs syndrome) 02/02/2015  . Combined congestive systolic and diastolic heart failure ([place]) 02/02/2015  . Urinary retention 12/30/2014  . Coronary artery disease  involving native coronary artery of native heart without angina pectoris 12/30/2014  . Expressive aphasia 12/30/2014  . Chronic constipation   . Orthostatic hypotension   . Chronic knee pain 12/03/2014  . GERD (gastroesophageal reflux disease)   . Dyspnea on exertion 08/11/2014  . Hypothyroidism 08/11/2014  . Ventricular tachycardia ([place]) 07/27/2014  . Upper airway cough syndrome 07/14/2014  . SSS (sick sinus syndrome) ([place]) 01/31/2014  . Hypercholesterolemia 01/30/2014  . Cardiomyopathy, ischemic 08/25/2012  . Pacemaker 08/25/2012    Past Surgical History:  Procedure Laterality Date  . CARDIAC CATHETERIZATION  04/2010   LIMA to LAD patent,SVG to OM patent,no in-stnet restenosis RCA  . CATARACT EXTRACTION W/ INTRAOCULAR LENS  IMPLANT, BILATERAL Bilateral 2012  . CORONARY ANGIOPLASTY WITH STENT PLACEMENT  07/2009   bare metal stent to SVG to the RCA  . CORONARY ARTERY BYPASS GRAFT  1996   LIMA to LAD,SVG to RCA & SVG to OM  . INSERT / REPLACE / REMOVE PACEMAKER  2010  . MELANOMA EXCISION  05/1974 X2   "taken off my back" (12/15/2012)  . NM MYOVIEW LTD  06/2011   low risk  . PPM GENERATOR CHANGEOUT N/A 01/22/2017   Procedure: PPM GENERATOR CHANGEOUT;  Surgeon: [name], MD;  Location: [facility];  Service: Cardiovascular;  Laterality: N/A;  . TONSILLECTOMY  1938  . TRANSURETHRAL RESECTION OF PROSTATE  1986  . US ECHOCARDIOGRAPHY  07/11/2009   EF 45-50%       Family History  Problem Relation Age of Onset  . Coronary artery disease Mother   . Diabetes Mother   . Heart disease Mother   . Coronary artery disease Father   . Diabetes Father   . Lung cancer Father   . Heart disease Father   . Anuerysm Son   . Heart disease Brother   . Colon cancer Neg Hx   . Esophageal cancer Neg Hx     Social History   Tobacco Use  . Smoking status: Never Smoker  . Smokeless tobacco: Never Used  Vaping Use  . Vaping Use: Never used  Substance Use Topics  . Alcohol use:  No    Alcohol/week: 0.0 standard drinks  . Drug use: No    Home Medications Prior to Admission medications   Medication Sig Start Date End Date Taking? Authorizing Provider  acetaminophen (TYLENOL) 500 MG tablet Take 500 mg by mouth 3 (three) times daily.     [provider]  Amino Acids-Protein Hydrolys (FEEDING SUPPLEMENT, PRO-STAT SUGAR FREE 64,) LIQD Take 30 mLs by mouth daily.     [provider]  amiodarone (PACERONE) 100 MG tablet Take 1 tablet (100 mg total) by mouth daily. 01/16/17   Croitoru, Mihai, MD  aspirin EC 81 MG tablet Take 1 tablet (81 mg total) by mouth daily. 01/12/16  Reyne Dumas, MD  Cholecalciferol (VITAMIN D3) 5000 UNITS CAPS Take 5,000 Units by mouth every Monday.     [provider]  clotrimazole-betamethasone (LOTRISONE) cream Apply 1 application topically daily as needed.    [provider]  Droxidopa ([name]) 100 MG CAPS TAKE 1 CAPSULE BY MOUTH THREE TIMES DAILY. TAKE LAST DOSE AT LEAST 3 HOURS BEFORE BEDTIME. 12/02/18   Croitoru, Mihai, MD  DULoxetine (CYMBALTA) 20 MG capsule Take 20 mg by mouth daily. For anxiety,insomnia and back pain    [provider]  fluticasone (FLONASE) 50 MCG/ACT nasal spray Place 1 spray into both nostrils 2 (two) times daily.    [provider]  Glycerin (ORAJEL DRY MOUTH MT) Use as directed in the mouth or throat. Swish and spit 10 ml 4 x day as needed for sore gums. Continue until dentures fit and/or next dental visit. Four Times A Day - PRN    [provider]  guaiFENesin (ROBITUSSIN) 100 MG/5ML SOLN Take 5 mLs (100 mg total) by mouth every 4 (four) hours as needed for cough or to loosen phlegm. 09/04/17   Lavina Hamman, MD  ketoconazole (NIZORAL) 2 % cream Apply 1 application topically as needed for irritation. Apply small amount behind ears and in creases beside nose BID x 7 days. MAY REPEAT PRN. (Family supplied).    [provider]  levothyroxine  (SYNTHROID, LEVOTHROID) 100 MCG tablet Take 100 mcg by mouth daily before breakfast.    [provider]  meclizine (ANTIVERT) 25 MG tablet Take 1 tablet (25 mg total) by mouth 2 (two) times daily as needed for dizziness. 09/04/17   Lavina Hamman, MD  Melatonin 3 MG TABS Take 3 mg by mouth at bedtime.    [provider]  methocarbamol (ROBAXIN) 500 MG tablet Take 250 mg by mouth 2 (two) times daily. As needed    [provider]  midodrine (PROAMATINE) 5 MG tablet Take 5 mg by mouth 3 (three) times daily with meals. Notify provider if BP is < 90/60.    [provider]  mineral oil-hydrophilic petrolatum (AQUAPHOR) ointment Apply 1 application topically 2 (two) times daily as needed for dry skin. To face    [provider]  Nutritional Supplements (FEEDING SUPPLEMENT, BOOST BREEZE,) LIQD Take by mouth 2 (two) times daily. 06/01/18   [provider]  pantoprazole (PROTONIX) 40 MG tablet Take 40 mg by mouth daily.    [provider]  Plecanatide (TRULANCE) 3 MG TABS Take 3 mg by mouth daily. 01/22/18   Cirigliano, Vito V, DO  polyethylene glycol (MIRALAX / GLYCOLAX) 17 g packet Take 17 g by mouth daily as needed. Take 1-2 capfuls in 8 oz of H2O    [provider]  potassium chloride (KLOR-CON) 10 MEQ tablet Take 2 tablets (20 mEq total) by mouth daily. 08/25/19   Davonna Belling, MD  pravastatin (PRAVACHOL) 40 MG tablet Take 1 tablet (40 mg total) by mouth at bedtime. 10/21/16   Blanchie Serve, MD  rOPINIRole (REQUIP) 0.25 MG tablet Take 0.5 mg by mouth 2 (two) times daily.    [provider]  rOPINIRole (REQUIP) 0.5 MG tablet Take 0.5 mg by mouth daily as needed.    [provider]  sennosides-docusate sodium (SENOKOT-S) 8.6-50 MG tablet Take 2 tablets by mouth 2 (two) times daily.     [provider]  torsemide (DEMADEX) 10 MG tablet Take 20 mg by mouth daily. 20 mg once a day  [provider]     Allergies    Altace [ramipril], Crestor [rosuvastatin calcium], Penicillins, Sulfa antibiotics, and Sulfamethoxazole-trimethoprim  Review of Systems   Review of Systems  Constitutional: Negative for appetite change and fever.  Cardiovascular: Negative for chest pain.  Genitourinary: Positive for difficulty urinating and hematuria.  Musculoskeletal: Negative for back pain.  Skin: Negative for rash.  Neurological: Negative for weakness.  Psychiatric/Behavioral: Negative for confusion.    Physical Exam Updated Vital Signs BP 132/68   Pulse 75   Temp 98.6 F (37 C) (Oral)   Resp 18   SpO2 100%   Physical Exam Vitals and nursing note reviewed.  HENT:     Head: Atraumatic.  Cardiovascular:     Rate and Rhythm: Regular rhythm.  Pulmonary:     Breath sounds: No wheezing or rhonchi.  Abdominal:     Tenderness: There is abdominal tenderness.     Comments: Lower abdominal tenderness and fullness  Musculoskeletal:        General: No tenderness.     Cervical back: Neck supple.  Skin:    General: Skin is warm.     Capillary Refill: Capillary refill takes less than 2 seconds.  Neurological:     Mental Status: He is alert and oriented to person, place, and time.     ED Results / Procedures / Treatments   Labs (all labs ordered are listed, but only abnormal results are displayed) Labs Reviewed  BASIC METABOLIC PANEL - Abnormal; Notable for the following components:      Result Value   Potassium 2.9 (*)    Glucose, Bld 125 (*)    All other components within normal limits  CBC - Abnormal; Notable for the following components:   WBC 12.1 (*)    RBC 3.95 (*)    HCT 38.9 (*)    All other components within normal limits    EKG None  Radiology No results found.  Procedures Procedures (including critical care time)  Medications Ordered in ED Medications  lidocaine (XYLOCAINE) 2 % jelly 1 application (has no administration in time range)    ED Course  I have  reviewed the triage vital signs and the nursing notes.  Pertinent labs & imaging results that were available during my care of the patient were reviewed by me and considered in my medical decision making (see chart for details).    MDM Rules/Calculators/A&P                          Patient with lower abdominal pain.  Urinary obstruction on ultrasound.  Foley catheter in place.  Three-way catheter placed in and continuous bowel irrigation done with improvement of the hematuria.  Will discharge home with urology follow-up.  Has chronic indwelling catheter at baseline.  Also had mild hypokalemia.  Will supplement but will need outpatient follow-up.  Discharge home Final Clinical Impression(s) / ED Diagnoses Final diagnoses:  Hematuria, unspecified type  Obstructed Foley catheter, initial encounter ([place])  Hypokalemia    Rx / DC Orders ED Discharge Orders         Ordered    potassium chloride (KLOR-CON) 10 MEQ tablet  Daily     Discontinue  Reprint     08/25/19 0034           Davonna Belling, MD 08/25/19 [phone]

## 2019-08-25 NOTE — Assessment & Plan Note (Addendum)
Social worker, MOST DNR, full scope of treatment, transfer to hospital/IVF/ABT as indicated, feeding tube for a defined period. Time spend 25 minutes.

## 2019-08-25 NOTE — Discharge Instructions (Addendum)
We have left a larger Foley catheter in.  Follow-up with your urologist for further evaluation of the bleeding.  Your potassium is mildly low and we will give you a small amount of supplementation here.  Follow-up with your primary care doctor for this.

## 2019-08-25 NOTE — Assessment & Plan Note (Signed)
Recurrent obstruction of Foley, hematuria, f/u Urology ASAP

## 2019-08-26 ENCOUNTER — Encounter (HOSPITAL_COMMUNITY): Payer: Self-pay

## 2019-08-26 ENCOUNTER — Inpatient Hospital Stay (HOSPITAL_COMMUNITY): Payer: Medicare PPO

## 2019-08-26 ENCOUNTER — Emergency Department (HOSPITAL_COMMUNITY): Payer: Medicare PPO

## 2019-08-26 DIAGNOSIS — N179 Acute kidney failure, unspecified: Secondary | ICD-10-CM | POA: Diagnosis not present

## 2019-08-26 DIAGNOSIS — R000 Tachycardia, unspecified: Principal | ICD-10-CM

## 2019-08-26 DIAGNOSIS — I951 Orthostatic hypotension: Secondary | ICD-10-CM | POA: Diagnosis not present

## 2019-08-26 DIAGNOSIS — I351 Nonrheumatic aortic (valve) insufficiency: Secondary | ICD-10-CM

## 2019-08-26 DIAGNOSIS — G9341 Metabolic encephalopathy: Secondary | ICD-10-CM | POA: Diagnosis not present

## 2019-08-26 DIAGNOSIS — G903 Multi-system degeneration of the autonomic nervous system: Secondary | ICD-10-CM | POA: Diagnosis not present

## 2019-08-26 DIAGNOSIS — N029 Recurrent and persistent hematuria with unspecified morphologic changes: Secondary | ICD-10-CM | POA: Diagnosis not present

## 2019-08-26 DIAGNOSIS — T83091A Other mechanical complication of indwelling urethral catheter, initial encounter: Secondary | ICD-10-CM | POA: Diagnosis present

## 2019-08-26 DIAGNOSIS — E785 Hyperlipidemia, unspecified: Secondary | ICD-10-CM | POA: Diagnosis present

## 2019-08-26 DIAGNOSIS — Z8249 Family history of ischemic heart disease and other diseases of the circulatory system: Secondary | ICD-10-CM | POA: Diagnosis not present

## 2019-08-26 DIAGNOSIS — I959 Hypotension, unspecified: Secondary | ICD-10-CM

## 2019-08-26 DIAGNOSIS — Z515 Encounter for palliative care: Secondary | ICD-10-CM | POA: Diagnosis not present

## 2019-08-26 DIAGNOSIS — K449 Diaphragmatic hernia without obstruction or gangrene: Secondary | ICD-10-CM | POA: Diagnosis present

## 2019-08-26 DIAGNOSIS — E876 Hypokalemia: Secondary | ICD-10-CM | POA: Diagnosis present

## 2019-08-26 DIAGNOSIS — I350 Nonrheumatic aortic (valve) stenosis: Secondary | ICD-10-CM

## 2019-08-26 DIAGNOSIS — I472 Ventricular tachycardia: Secondary | ICD-10-CM

## 2019-08-26 DIAGNOSIS — E119 Type 2 diabetes mellitus without complications: Secondary | ICD-10-CM | POA: Diagnosis present

## 2019-08-26 DIAGNOSIS — Z8673 Personal history of transient ischemic attack (TIA), and cerebral infarction without residual deficits: Secondary | ICD-10-CM | POA: Diagnosis not present

## 2019-08-26 DIAGNOSIS — I495 Sick sinus syndrome: Secondary | ICD-10-CM | POA: Diagnosis present

## 2019-08-26 DIAGNOSIS — Z833 Family history of diabetes mellitus: Secondary | ICD-10-CM | POA: Diagnosis not present

## 2019-08-26 DIAGNOSIS — I4729 Other ventricular tachycardia: Secondary | ICD-10-CM

## 2019-08-26 DIAGNOSIS — I5042 Chronic combined systolic (congestive) and diastolic (congestive) heart failure: Secondary | ICD-10-CM | POA: Diagnosis not present

## 2019-08-26 DIAGNOSIS — R310 Gross hematuria: Secondary | ICD-10-CM

## 2019-08-26 DIAGNOSIS — E039 Hypothyroidism, unspecified: Secondary | ICD-10-CM | POA: Diagnosis present

## 2019-08-26 DIAGNOSIS — Z66 Do not resuscitate: Secondary | ICD-10-CM | POA: Diagnosis not present

## 2019-08-26 DIAGNOSIS — Y738 Miscellaneous gastroenterology and urology devices associated with adverse incidents, not elsewhere classified: Secondary | ICD-10-CM | POA: Diagnosis present

## 2019-08-26 DIAGNOSIS — R319 Hematuria, unspecified: Secondary | ICD-10-CM | POA: Diagnosis not present

## 2019-08-26 DIAGNOSIS — D62 Acute posthemorrhagic anemia: Secondary | ICD-10-CM | POA: Diagnosis not present

## 2019-08-26 DIAGNOSIS — I252 Old myocardial infarction: Secondary | ICD-10-CM | POA: Diagnosis not present

## 2019-08-26 DIAGNOSIS — Z20822 Contact with and (suspected) exposure to covid-19: Secondary | ICD-10-CM | POA: Diagnosis not present

## 2019-08-26 DIAGNOSIS — I255 Ischemic cardiomyopathy: Secondary | ICD-10-CM | POA: Diagnosis not present

## 2019-08-26 DIAGNOSIS — Z7189 Other specified counseling: Secondary | ICD-10-CM | POA: Diagnosis not present

## 2019-08-26 DIAGNOSIS — R6510 Systemic inflammatory response syndrome (SIRS) of non-infectious origin without acute organ dysfunction: Secondary | ICD-10-CM

## 2019-08-26 DIAGNOSIS — I110 Hypertensive heart disease with heart failure: Secondary | ICD-10-CM | POA: Diagnosis not present

## 2019-08-26 DIAGNOSIS — Z950 Presence of cardiac pacemaker: Secondary | ICD-10-CM | POA: Diagnosis not present

## 2019-08-26 DIAGNOSIS — N3289 Other specified disorders of bladder: Secondary | ICD-10-CM | POA: Diagnosis present

## 2019-08-26 DIAGNOSIS — Z951 Presence of aortocoronary bypass graft: Secondary | ICD-10-CM | POA: Diagnosis not present

## 2019-08-26 DIAGNOSIS — M810 Age-related osteoporosis without current pathological fracture: Secondary | ICD-10-CM | POA: Diagnosis present

## 2019-08-26 LAB — BASIC METABOLIC PANEL
Anion gap: 21 — ABNORMAL HIGH (ref 5–15)
BUN: 23 mg/dL (ref 8–23)
CO2: 16 mmol/L — ABNORMAL LOW (ref 22–32)
Calcium: 8.6 mg/dL — ABNORMAL LOW (ref 8.9–10.3)
Chloride: 102 mmol/L (ref 98–111)
Creatinine, Ser: 1.53 mg/dL — ABNORMAL HIGH (ref 0.61–1.24)
GFR calc Af Amer: 46 mL/min — ABNORMAL LOW (ref >60)
GFR calc non Af Amer: 39 mL/min — ABNORMAL LOW (ref >60)
Glucose, Bld: 204 mg/dL — ABNORMAL HIGH (ref 70–99)
Potassium: 3.5 mmol/L (ref 3.5–5.1)
Sodium: 139 mmol/L (ref 135–145)

## 2019-08-26 LAB — URINALYSIS, ROUTINE W REFLEX MICROSCOPIC
Bacteria, UA: NONE SEEN
Bilirubin Urine: NEGATIVE
Glucose, UA: NEGATIVE mg/dL
Ketones, ur: NEGATIVE mg/dL
Nitrite: NEGATIVE
Protein, ur: 100 mg/dL — AB
RBC / HPF: >50 RBC/hpf — ABNORMAL HIGH (ref 0–5)
Specific Gravity, Urine: 1.005 (ref 1.005–1.030)
WBC, UA: >50 WBC/hpf — ABNORMAL HIGH (ref 0–5)
pH: 6 (ref 5.0–8.0)

## 2019-08-26 LAB — ECHOCARDIOGRAM COMPLETE
AR max vel: 0.65 cm2
AV Area VTI: 0.56 cm2
AV Area mean vel: 0.62 cm2
AV Mean grad: 32.2 mmHg
AV Peak grad: 50.8 mmHg
Ao pk vel: 3.56 m/s
Area-P 1/2: 3.37 cm2
Height: 70 in
P 1/2 time: 365 msec
S' Lateral: 3.65 cm
Weight: 2966.51 oz

## 2019-08-26 LAB — CBC WITH DIFFERENTIAL/PLATELET
Abs Immature Granulocytes: 0.12 10*3/uL — ABNORMAL HIGH (ref 0.00–0.07)
Basophils Absolute: 0.1 10*3/uL (ref 0.0–0.1)
Basophils Relative: 0 %
Eosinophils Absolute: 0.1 10*3/uL (ref 0.0–0.5)
Eosinophils Relative: 1 %
HCT: 37.2 % — ABNORMAL LOW (ref 39.0–52.0)
Hemoglobin: 12.4 g/dL — ABNORMAL LOW (ref 13.0–17.0)
Immature Granulocytes: 1 %
Lymphocytes Relative: 15 %
Lymphs Abs: 2.6 10*3/uL (ref 0.7–4.0)
MCH: 33.1 pg (ref 26.0–34.0)
MCHC: 33.3 g/dL (ref 30.0–36.0)
MCV: 99.2 fL (ref 80.0–100.0)
Monocytes Absolute: 1.2 10*3/uL — ABNORMAL HIGH (ref 0.1–1.0)
Monocytes Relative: 7 %
Neutro Abs: 13.2 10*3/uL — ABNORMAL HIGH (ref 1.7–7.7)
Neutrophils Relative %: 76 %
Platelets: 303 10*3/uL (ref 150–400)
RBC: 3.75 MIL/uL — ABNORMAL LOW (ref 4.22–5.81)
RDW: 14.5 % (ref 11.5–15.5)
WBC: 17.2 10*3/uL — ABNORMAL HIGH (ref 4.0–10.5)
nRBC: 0 % (ref 0.0–0.2)

## 2019-08-26 LAB — I-STAT CHEM 8, ED
BUN: 19 mg/dL (ref 8–23)
Calcium, Ion: 1.03 mmol/L — ABNORMAL LOW (ref 1.15–1.40)
Chloride: 99 mmol/L (ref 98–111)
Creatinine, Ser: 1.1 mg/dL (ref 0.61–1.24)
Glucose, Bld: 198 mg/dL — ABNORMAL HIGH (ref 70–99)
HCT: 38 % — ABNORMAL LOW (ref 39.0–52.0)
Hemoglobin: 12.9 g/dL — ABNORMAL LOW (ref 13.0–17.0)
Potassium: 2.9 mmol/L — ABNORMAL LOW (ref 3.5–5.1)
Sodium: 139 mmol/L (ref 135–145)
TCO2: 24 mmol/L (ref 22–32)

## 2019-08-26 LAB — PHOSPHORUS: Phosphorus: 4.8 mg/dL — ABNORMAL HIGH (ref 2.5–4.6)

## 2019-08-26 LAB — PROTIME-INR
INR: 1 (ref 0.8–1.2)
Prothrombin Time: 12.7 seconds (ref 11.4–15.2)

## 2019-08-26 LAB — MRSA PCR SCREENING: MRSA by PCR: POSITIVE — AB

## 2019-08-26 LAB — HEMOGLOBIN A1C
Hgb A1c MFr Bld: 5.8 % — ABNORMAL HIGH (ref 4.8–5.6)
Mean Plasma Glucose: 119.76 mg/dL

## 2019-08-26 LAB — TROPONIN I (HIGH SENSITIVITY)
Troponin I (High Sensitivity): 33 ng/L — ABNORMAL HIGH (ref <18)
Troponin I (High Sensitivity): 47 ng/L — ABNORMAL HIGH (ref <18)

## 2019-08-26 LAB — MAGNESIUM: Magnesium: 3.1 mg/dL — ABNORMAL HIGH (ref 1.7–2.4)

## 2019-08-26 LAB — GLUCOSE, CAPILLARY: Glucose-Capillary: 174 mg/dL — ABNORMAL HIGH (ref 70–99)

## 2019-08-26 LAB — SARS CORONAVIRUS 2 BY RT PCR (HOSPITAL ORDER, PERFORMED IN ~~LOC~~ HOSPITAL LAB): SARS Coronavirus 2: NEGATIVE

## 2019-08-26 MED ORDER — AMIODARONE HCL IN DEXTROSE 360-4.14 MG/200ML-% IV SOLN
30.0000 mg/h | INTRAVENOUS | Status: DC
  Administered 2019-08-26: 30 mg/h via INTRAVENOUS

## 2019-08-26 MED ORDER — FENTANYL CITRATE (PF) 100 MCG/2ML IJ SOLN
25.0000 ug | Freq: Once | INTRAMUSCULAR | Status: AC
  Administered 2019-08-26: 25 ug via INTRAVENOUS
  Filled 2019-08-26: qty 2

## 2019-08-26 MED ORDER — MUPIROCIN 2 % EX OINT
1.0000 "application " | TOPICAL_OINTMENT | Freq: Two times a day (BID) | CUTANEOUS | Status: AC
  Administered 2019-08-26 – 2019-08-30 (×10): 1 via NASAL
  Filled 2019-08-26: qty 22

## 2019-08-26 MED ORDER — ENSURE ENLIVE PO LIQD
237.0000 mL | Freq: Two times a day (BID) | ORAL | Status: DC
  Administered 2019-08-27 – 2019-09-03 (×16): 237 mL via ORAL

## 2019-08-26 MED ORDER — POTASSIUM CHLORIDE CRYS ER 20 MEQ PO TBCR
40.0000 meq | EXTENDED_RELEASE_TABLET | Freq: Once | ORAL | Status: AC
  Administered 2019-08-26: 40 meq via ORAL
  Filled 2019-08-26: qty 2

## 2019-08-26 MED ORDER — PROCHLORPERAZINE EDISYLATE 10 MG/2ML IJ SOLN: 5.0000 mg | INTRAMUSCULAR | Status: DC | PRN

## 2019-08-26 MED ORDER — ROPINIROLE HCL 1 MG PO TABS
0.5000 mg | ORAL_TABLET | Freq: Every day | ORAL | Status: DC | PRN
  Administered 2019-08-26 – 2019-09-01 (×5): 0.5 mg via ORAL
  Filled 2019-08-26 (×6): qty 1

## 2019-08-26 MED ORDER — MIDODRINE HCL 5 MG PO TABS
5.0000 mg | ORAL_TABLET | Freq: Three times a day (TID) | ORAL | Status: DC
  Administered 2019-08-26 – 2019-09-03 (×26): 5 mg via ORAL
  Filled 2019-08-26 (×25): qty 1

## 2019-08-26 MED ORDER — INSULIN ASPART 100 UNIT/ML ~~LOC~~ SOLN
0.0000 [IU] | Freq: Three times a day (TID) | SUBCUTANEOUS | Status: DC
  Administered 2019-08-26: 2 [IU] via SUBCUTANEOUS
  Administered 2019-08-27 – 2019-08-29 (×3): 1 [IU] via SUBCUTANEOUS
  Administered 2019-08-31 – 2019-09-01 (×2): 2 [IU] via SUBCUTANEOUS
  Administered 2019-09-03: 1 [IU] via SUBCUTANEOUS
  Administered 2019-09-03: 2 [IU] via SUBCUTANEOUS

## 2019-08-26 MED ORDER — POTASSIUM CHLORIDE CRYS ER 20 MEQ PO TBCR
80.0000 meq | EXTENDED_RELEASE_TABLET | Freq: Once | ORAL | Status: AC
  Administered 2019-08-26: 80 meq via ORAL
  Filled 2019-08-26: qty 4

## 2019-08-26 MED ORDER — PANTOPRAZOLE SODIUM 40 MG PO TBEC
40.0000 mg | DELAYED_RELEASE_TABLET | Freq: Every day | ORAL | Status: DC
  Administered 2019-08-26: 40 mg via ORAL
  Filled 2019-08-26: qty 1

## 2019-08-26 MED ORDER — ROPINIROLE HCL 0.5 MG PO TABS
0.5000 mg | ORAL_TABLET | Freq: Two times a day (BID) | ORAL | Status: DC
  Administered 2019-08-26 – 2019-09-03 (×16): 0.5 mg via ORAL
  Filled 2019-08-26 (×17): qty 1

## 2019-08-26 MED ORDER — SODIUM CHLORIDE 0.9 % IV SOLN
2.0000 g | Freq: Two times a day (BID) | INTRAVENOUS | Status: DC
  Administered 2019-08-26 – 2019-08-28 (×4): 2 g via INTRAVENOUS
  Filled 2019-08-26 (×4): qty 2

## 2019-08-26 MED ORDER — PLECANATIDE 3 MG PO TABS
3.0000 mg | ORAL_TABLET | Freq: Every day | ORAL | Status: DC
  Administered 2019-08-26 – 2019-09-03 (×9): 3 mg via ORAL

## 2019-08-26 MED ORDER — DROXIDOPA 100 MG PO CAPS: 100.0000 mg | ORAL_CAPSULE | Freq: Three times a day (TID) | ORAL | Status: DC

## 2019-08-26 MED ORDER — AMIODARONE HCL IN DEXTROSE 360-4.14 MG/200ML-% IV SOLN
60.0000 mg/h | INTRAVENOUS | Status: DC
  Administered 2019-08-26 (×2): 60 mg/h via INTRAVENOUS
  Filled 2019-08-26 (×2): qty 200

## 2019-08-26 MED ORDER — POLYETHYLENE GLYCOL 3350 17 G PO PACK
17.0000 g | PACK | Freq: Every day | ORAL | Status: DC | PRN
  Administered 2019-08-31 – 2019-09-01 (×2): 17 g via ORAL
  Filled 2019-08-26 (×2): qty 1

## 2019-08-26 MED ORDER — PLECANATIDE 3 MG PO TABS: 3.0000 mg | ORAL_TABLET | Freq: Every day | ORAL | Status: DC

## 2019-08-26 MED ORDER — VANCOMYCIN HCL IN DEXTROSE 1-5 GM/200ML-% IV SOLN
1000.0000 mg | INTRAVENOUS | Status: DC
  Administered 2019-08-27: 1000 mg via INTRAVENOUS
  Filled 2019-08-26: qty 200

## 2019-08-26 MED ORDER — SODIUM CHLORIDE 0.9 % IR SOLN
3000.0000 mL | Status: DC
  Administered 2019-08-26 – 2019-08-29 (×21): 3000 mL

## 2019-08-26 MED ORDER — DULOXETINE HCL 20 MG PO CPEP: 20.0000 mg | ORAL_CAPSULE | Freq: Every day | ORAL | Status: DC

## 2019-08-26 MED ORDER — MAGNESIUM SULFATE 2 GM/50ML IV SOLN
2.0000 g | Freq: Once | INTRAVENOUS | Status: AC
  Administered 2019-08-26: 2 g via INTRAVENOUS
  Filled 2019-08-26: qty 50

## 2019-08-26 MED ORDER — FENTANYL CITRATE (PF) 100 MCG/2ML IJ SOLN
50.0000 ug | Freq: Once | INTRAMUSCULAR | Status: AC
  Administered 2019-08-26: 50 ug via INTRAVENOUS
  Filled 2019-08-26: qty 2

## 2019-08-26 MED ORDER — LACTATED RINGERS IV BOLUS
500.0000 mL | Freq: Once | INTRAVENOUS | Status: AC
  Administered 2019-08-26: 500 mL via INTRAVENOUS

## 2019-08-26 MED ORDER — NYSTATIN 100000 UNIT/ML MT SUSP
5.0000 mL | Freq: Four times a day (QID) | OROMUCOSAL | Status: DC
  Administered 2019-08-26 – 2019-09-03 (×31): 500000 [IU] via ORAL
  Filled 2019-08-26 (×29): qty 5

## 2019-08-26 MED ORDER — ORAL CARE MOUTH RINSE
15.0000 mL | Freq: Two times a day (BID) | OROMUCOSAL | Status: DC
  Administered 2019-08-26 – 2019-09-03 (×17): 15 mL via OROMUCOSAL

## 2019-08-26 MED ORDER — SODIUM CHLORIDE 0.9 % IV SOLN
2.0000 g | Freq: Once | INTRAVENOUS | Status: AC
  Administered 2019-08-26: 2 g via INTRAVENOUS
  Filled 2019-08-26: qty 2

## 2019-08-26 MED ORDER — VANCOMYCIN HCL 1750 MG/350ML IV SOLN
1750.0000 mg | Freq: Once | INTRAVENOUS | Status: AC
  Administered 2019-08-26: 1750 mg via INTRAVENOUS
  Filled 2019-08-26: qty 350

## 2019-08-26 MED ORDER — MIDODRINE HCL 5 MG PO TABS
5.0000 mg | ORAL_TABLET | Freq: Three times a day (TID) | ORAL | Status: DC
  Filled 2019-08-26: qty 1

## 2019-08-26 MED ORDER — SODIUM CHLORIDE 0.9 % IV SOLN
1.0000 g | Freq: Once | INTRAVENOUS | Status: AC
  Administered 2019-08-26: 1 g via INTRAVENOUS
  Filled 2019-08-26: qty 10

## 2019-08-26 MED ORDER — SODIUM CHLORIDE 0.9% FLUSH
3.0000 mL | Freq: Two times a day (BID) | INTRAVENOUS | Status: DC
  Administered 2019-08-26 – 2019-09-03 (×17): 3 mL via INTRAVENOUS

## 2019-08-26 MED ORDER — PERFLUTREN LIPID MICROSPHERE
1.0000 mL | INTRAVENOUS | Status: AC | PRN
  Administered 2019-08-26: 2 mL via INTRAVENOUS
  Filled 2019-08-26: qty 10

## 2019-08-26 MED ORDER — CHLORHEXIDINE GLUCONATE CLOTH 2 % EX PADS
6.0000 | MEDICATED_PAD | Freq: Every day | CUTANEOUS | Status: DC
  Administered 2019-08-26 – 2019-09-03 (×9): 6 via TOPICAL

## 2019-08-26 MED ORDER — LEVOTHYROXINE SODIUM 100 MCG PO TABS
100.0000 ug | ORAL_TABLET | Freq: Every day | ORAL | Status: DC
  Administered 2019-08-26 – 2019-09-03 (×9): 100 ug via ORAL
  Filled 2019-08-26 (×9): qty 1

## 2019-08-26 MED ORDER — METHOCARBAMOL 500 MG PO TABS
250.0000 mg | ORAL_TABLET | Freq: Two times a day (BID) | ORAL | Status: DC | PRN
  Administered 2019-08-30 – 2019-09-01 (×3): 250 mg via ORAL
  Filled 2019-08-26 (×3): qty 1

## 2019-08-26 MED ORDER — CHLORHEXIDINE GLUCONATE CLOTH 2 % EX PADS
6.0000 | MEDICATED_PAD | Freq: Every day | CUTANEOUS | Status: AC
  Administered 2019-08-27 – 2019-08-30 (×3): 6 via TOPICAL

## 2019-08-26 MED ORDER — DROXIDOPA 100 MG PO CAPS
100.0000 mg | ORAL_CAPSULE | Freq: Three times a day (TID) | ORAL | Status: DC
  Administered 2019-08-26 – 2019-09-03 (×24): 100 mg via ORAL

## 2019-08-26 MED ORDER — ACETAMINOPHEN 325 MG PO TABS
650.0000 mg | ORAL_TABLET | Freq: Four times a day (QID) | ORAL | Status: DC | PRN
  Administered 2019-08-27 – 2019-09-03 (×4): 650 mg via ORAL
  Filled 2019-08-26 (×4): qty 2

## 2019-08-26 MED ORDER — SODIUM CHLORIDE 0.9 % IV SOLN: 1.0000 g | INTRAVENOUS | Status: DC

## 2019-08-26 MED ORDER — AMIODARONE HCL 200 MG PO TABS
100.0000 mg | ORAL_TABLET | Freq: Every day | ORAL | Status: DC
  Administered 2019-08-26 – 2019-09-03 (×9): 100 mg via ORAL
  Filled 2019-08-26 (×9): qty 1

## 2019-08-26 MED ORDER — ACETAMINOPHEN 650 MG RE SUPP: 650.0000 mg | Freq: Four times a day (QID) | RECTAL | Status: DC | PRN

## 2019-08-26 MED ORDER — OXYBUTYNIN CHLORIDE ER 5 MG PO TB24
10.0000 mg | ORAL_TABLET | Freq: Every day | ORAL | Status: DC
  Administered 2019-08-26 – 2019-09-02 (×9): 10 mg via ORAL
  Filled 2019-08-26 (×8): qty 2
  Filled 2019-08-26: qty 1

## 2019-08-26 MED ORDER — AMIODARONE LOAD VIA INFUSION
150.0000 mg | Freq: Once | INTRAVENOUS | Status: AC
  Administered 2019-08-26: 150 mg via INTRAVENOUS
  Filled 2019-08-26: qty 83.34

## 2019-08-26 MED ORDER — PRAVASTATIN SODIUM 40 MG PO TABS
40.0000 mg | ORAL_TABLET | Freq: Every day | ORAL | Status: DC
  Administered 2019-08-26 – 2019-09-02 (×8): 40 mg via ORAL
  Filled 2019-08-26 (×7): qty 2
  Filled 2019-08-26: qty 1

## 2019-08-26 MED ORDER — POTASSIUM CHLORIDE 10 MEQ/100ML IV SOLN
10.0000 meq | INTRAVENOUS | Status: AC
  Administered 2019-08-26 (×3): 10 meq via INTRAVENOUS
  Filled 2019-08-26 (×3): qty 100

## 2019-08-26 NOTE — ED Notes (Signed)
Contacted floor regarding patients pending bed assignment.

## 2019-08-26 NOTE — Consult Note (Addendum)
Cardiology Consultation:   Patient ID: [name] MRN: [id number]; DOB: 01/31/1927  Admit date: 08/25/2019 Date of Consult: 08/26/2019  Primary Care Provider: [name], MD [facility] Cardiologist: [name], MD  [facility] Electrophysiologist:  None    Patient Profile:   Adam [name] is a 86 y.o. male with a hx of long-standing CAD s/p CABG in 1996, [place] to SVG-RCA 2011, extensive scar from inferior wall myocardial infarction, ischemic cardiomyopathy with chronic combined systolic and diastolic heart failure, history of sustained symptomatic ventricular tachycardia (probably "scar VT", on amiodarone for this), ischemic stroke (previously on DAPT for this), sinus node dysfunction with dual chamber permanent pacemaker (generator change 2018 St. Jude [employer]urity), severe orthostatic hypotension that is felt to be neurogenic in etiology, arthritis, HLD, moderate AS by echo 2019, urinary retention s/p catheter who is being seen today for the evaluation of arrhythmia at the request of Dr. Neysa Bonito.  History of Present Illness:   He is followed by Dr. Sallyanne Kuster for the above issues. In addition to CHF, CAD, and PPM he also has history of known moderate AS which has been managed conservatively with last echo in 2019, repeat echo planned to be done later this year.   He presented to [facility] with frank hematuria. He had been seen in the ED twice for this recently, associated with abdominal pain as well. CT scan showed suspected large bladder hematoma, stranding possibly reflective of hemorrhagic cystitis, cannot exclude underlying mass. Urology recommended irritation. Per chart review, in the ED the patient had an episode of possible Vtach (although see below - not Vtach) which broke spontaneously and he did not lose a pulse. EKG was challenging to interpret due to artifact, although appeared possibly sinus tach with V-pacing. Amiodarone was initiated and potassium was repleted. This morning he  was noted to be hypotensive at 62/35 therefore cardiology called for urgent assessment. Telemetry is only available since arriving on the unit but has shown atrial pacing/ventricular sensing without recurrent arrhythmia. Per d/w IM, blood pressure cuff was switched to left arm and found to be much better, currently mid 90s/50s. He denies any chest pain. He reports chronic dyspnea. No edema. Foley bag urine remains the color of fruit punch. Labs also show AKI with Cr 1.53, K 2.9 -> 3.5, Mg 3.1, hsTroponin 33->47, Hgb previously 13, now  12.4 by last check, WBC 17k. We called for urgent pacemaker interrogation which reveals the episode was in fact v-pacing with pacemaker mediated tachycardia. Pacemaker settings were adjusted to prevent PMT from happening again. IM suspects potential component of sepsis contributing to his hypotension, and also resumed his home midodrine which he had missed last night.   Past Medical History:  Diagnosis Date  . Arthritis    "minor, back and sometimes knees" (12/15/2012)  . Bradycardia    SSS s/p St Jude PPM 04/12/2008  . CAD (coronary artery disease) 12/30/2014   CABG (LIMA-LAD, SVG-RCA, SVG-OM in 1996).  07/2009 BMS to SVG-RCA. Cath in 04/2010 with patent stents   . Cardiomyopathy, ischemic 08/25/2012  . Chronic knee pain 12/03/2014  . Combined congestive systolic and diastolic heart failure ([place]) 02/02/2015   Hx EF 41%. BNP 96.8 02/21/15 Torsemide 04/06/15 Na 142, K 4.6, Bun 16, creat 0.89 04/20/14 BNP 111.7, Na 142, K 4.6, Bun 16, creat 0.9   . Depression with anxiety 02/02/2015   02/21/15 Hgb A1c 5.8 03/10/15 MMSE 30/30   . Dizziness, after diuretic asscoiated with hypotension and responded to fluid bolus 06/05/2011  04/28/15 US carotid R+L normal bilateral arterial velocities.    Marland Kitchen Dyspnea 08/11/2014   Followed in Pulmonary clinic/ [facility]/ [place]  - 08/11/2014  Walked RA x 1 laps @ 185 ft each stopped due to fatigue/off balance/ slow pace/  no sob or desat  - PFT's   09/26/2014  FEV1 2.26 (85 % ) ratio 76  p no % improvement from saba with DLCO  67 % corrects to 93 % for alv volume      Since prev study 08/04/13 minimal change lung vol or dlco    . Embolic cerebral infarction ([place]) 12/06/2015  . GERD (gastroesophageal reflux disease)   . Gout 02/09/2015  . Hiatal hernia   . Hyperlipidemia   . Hypertension   . Hypothyroid   . Influenza A 03/10/2016  . Insomnia   . Melanoma of back ([place]) 1976  . Myocardial infarction [facility]) 1996; 2011   "both silent" (12/15/2012)  . Nonrheumatic aortic valve stenosis   . Orthostatic hypotension   . Osteoporosis, senile   . Pacemaker   . RBBB   . Restless leg 02/02/2015  . Right leg weakness 12/06/2015  . [facility] spotted fever   . S/P CABG x 4   . Sick sinus syndrome ([place]) 01/31/2014  . Sustained ventricular tachycardia ([place]) 07/27/2014  . Urinary retention 12/30/2014   has a cathrater    Past Surgical History:  Procedure Laterality Date  . CARDIAC CATHETERIZATION  04/2010   LIMA to LAD patent,SVG to OM patent,no in-stnet restenosis RCA  . CATARACT EXTRACTION W/ INTRAOCULAR LENS  IMPLANT, BILATERAL Bilateral 2012  . CORONARY ANGIOPLASTY WITH STENT PLACEMENT  07/2009   bare metal stent to SVG to the RCA  . CORONARY ARTERY BYPASS GRAFT  1996   LIMA to LAD,SVG to RCA & SVG to OM  . INSERT / REPLACE / REMOVE PACEMAKER  2010  . MELANOMA EXCISION  05/1974 X2   "taken off my back" (12/15/2012)  . NM MYOVIEW LTD  06/2011   low risk  . PPM GENERATOR CHANGEOUT N/A 01/22/2017   Procedure: PPM GENERATOR CHANGEOUT;  Surgeon: [name], MD;  Location: [place] [facility];  Service: Cardiovascular;  Laterality: N/A;  . TONSILLECTOMY  1938  . TRANSURETHRAL RESECTION OF PROSTATE  1986  . US ECHOCARDIOGRAPHY  07/11/2009   EF 45-50%     Home Medications:  Prior to Admission medications   Medication Sig Start Date End Date Taking? Authorizing Provider  acetaminophen (TYLENOL) 500 MG tablet Take 500 mg by mouth 3  (three) times daily.    Yes [provider]  Amino Acids-Protein Hydrolys (FEEDING SUPPLEMENT, PRO-STAT SUGAR FREE 64,) LIQD Take 30 mLs by mouth daily.    Yes [provider]  amiodarone (PACERONE) 100 MG tablet Take 1 tablet (100 mg total) by mouth daily. 01/16/17  Yes Croitoru, Mihai, MD  aspirin EC 81 MG tablet Take 1 tablet (81 mg total) by mouth daily. 01/12/16  Yes Reyne Dumas, MD  Cholecalciferol (VITAMIN D3) 5000 UNITS CAPS Take 5,000 Units by mouth every Monday.    Yes [provider]  clotrimazole-betamethasone (LOTRISONE) cream Apply 1 application topically daily as needed.   Yes [provider]  Droxidopa ([name]) 100 MG CAPS TAKE 1 CAPSULE BY MOUTH THREE TIMES DAILY. TAKE LAST DOSE AT LEAST 3 HOURS BEFORE BEDTIME. Patient taking differently: Take 100 mg by mouth in the morning, at noon, and at bedtime. TAKE LAST DOSE AT LEAST 3 HOURS BEFORE BEDTIME. 12/02/18  Yes Croitoru, Mihai, MD  DULoxetine (CYMBALTA) 20 MG capsule Take 20 mg by mouth daily. For anxiety,insomnia and back pain   Yes [provider]  fluticasone (FLONASE) 50 MCG/ACT nasal spray Place 1 spray into both nostrils 2 (two) times daily.   Yes [provider]  Glycerin (ORAJEL DRY MOUTH MT) Use as directed in the mouth or throat. Swish and spit 10 ml 4 x day as needed for sore gums. Continue until dentures fit and/or next dental visit. Four Times A Day - PRN   Yes [provider]  guaiFENesin (ROBITUSSIN) 100 MG/5ML SOLN Take 5 mLs (100 mg total) by mouth every 4 (four) hours as needed for cough or to loosen phlegm. 09/04/17  Yes Lavina Hamman, MD  ketoconazole (NIZORAL) 2 % cream Apply 1 application topically as needed for irritation. Apply small amount behind ears and in creases beside nose BID x 7 days. MAY REPEAT PRN. (Family supplied).   Yes [provider]  levothyroxine (SYNTHROID, LEVOTHROID) 100 MCG tablet Take 100 mcg by mouth daily before  breakfast.   Yes [provider]  meclizine (ANTIVERT) 25 MG tablet Take 1 tablet (25 mg total) by mouth 2 (two) times daily as needed for dizziness. 09/04/17  Yes Lavina Hamman, MD  Melatonin 3 MG TABS Take 3 mg by mouth at bedtime.   Yes [provider]  methocarbamol (ROBAXIN) 500 MG tablet Take 250 mg by mouth 2 (two) times daily as needed for muscle spasms.    Yes [provider]  midodrine (PROAMATINE) 5 MG tablet Take 5 mg by mouth 3 (three) times daily with meals. Notify provider if BP is < 90/60.   Yes [provider]  mineral oil-hydrophilic petrolatum (AQUAPHOR) ointment Apply 1 application topically 2 (two) times daily as needed for dry skin. To face   Yes [provider]  pantoprazole (PROTONIX) 40 MG tablet Take 40 mg by mouth daily.   Yes [provider]  Plecanatide (TRULANCE) 3 MG TABS Take 3 mg by mouth daily. 01/22/18  Yes Cirigliano, Vito V, DO  polyethylene glycol (MIRALAX / GLYCOLAX) 17 g packet Take 17 g by mouth daily as needed for mild constipation. Take 1-2 capfuls in 8 oz of H2O    Yes [provider]  pravastatin (PRAVACHOL) 40 MG tablet Take 1 tablet (40 mg total) by mouth at bedtime. 10/21/16  Yes Blanchie Serve, MD  rOPINIRole (REQUIP) 0.25 MG tablet Take 0.5 mg by mouth 2 (two) times daily.   Yes [provider]  rOPINIRole (REQUIP) 0.5 MG tablet Take 0.5 mg by mouth daily as needed.   Yes [provider]  sennosides-docusate sodium (SENOKOT-S) 8.6-50 MG tablet Take 2 tablets by mouth 2 (two) times daily.    Yes [provider]  torsemide (DEMADEX) 10 MG tablet Take 20 mg by mouth daily. 20 mg once a day   Yes [provider]    Inpatient Medications: Scheduled Meds: . amiodarone  100 mg Oral Daily  . Chlorhexidine Gluconate Cloth  6 each Topical Daily  . Droxidopa  100 mg Oral TID  . levothyroxine  100 mcg Oral QAC breakfast  . mouth rinse  15 mL Mouth Rinse BID  .  midodrine  5 mg Oral TID WC  . oxybutynin  10 mg Oral QHS  . pravastatin  40 mg Oral QHS  . rOPINIRole  0.5 mg Oral BID  . sodium chloride flush  3 mL Intravenous Q12H  Continuous Infusions: . ceFEPime (MAXIPIME) IV    . ceFEPime (MAXIPIME) IV    . [START ON 08/27/2019] vancomycin    . vancomycin     PRN Meds: acetaminophen **OR** acetaminophen, methocarbamol, polyethylene glycol, prochlorperazine, rOPINIRole  Allergies:    Allergies  Allergen Reactions  . Altace [Ramipril] Cough  . Crestor [Rosuvastatin Calcium] Rash  . Penicillins Rash and Other (See Comments)    Has patient had a PCN reaction causing immediate rash, facial/tongue/throat swelling, SOB or lightheadedness with hypotension: Yes Has patient had a PCN reaction causing severe rash involving mucus membranes or skin necrosis: No Has patient had a PCN reaction that required hospitalization No Has patient had a PCN reaction occurring within the last 10 years: No If all of the above answers are "NO", then may proceed with Cephalosporin use.   . Sulfa Antibiotics Rash  . Sulfamethoxazole-Trimethoprim Rash    Social History:   Social History   Socioeconomic History  . Marital status: Married    Spouse name: Not on file  . Number of children: 2  . Years of education: Masters  . Highest education level: Not on file  Occupational History  . Occupation: Retired Company secretary -Pensions consultant  Tobacco Use  . Smoking status: Never Smoker  . Smokeless tobacco: Never Used  Vaping Use  . Vaping Use: Never used  Substance and Sexual Activity  . Alcohol use: No    Alcohol/week: 0.0 standard drinks  . Drug use: No  . Sexual activity: Never  Other Topics Concern  . Not on file  Social History Narrative   Lives at [place] to [employer] 01/09/15   Married - Violet   Never smoked   Alcohol none   Previously employed as Scientist, forensic for [employer].         Diet:Low sodium   Do you drink/eat things  with caffeine? No   Marital status: Married                              What year were you married?1950   Do you live in a house, apartment, assisted living, condo, trailer, etc)?    Is it one or more stories? 1   How many persons live in your home? 2   Do you have any pets in your home? No   Current or past profession: Minister, [employer] Superiorendent   Do you exercise?     Very Little                                                 Type & how often:    Do you have a living will?  Yes   Do you have a DNR Form? Yes   Do you have a POA/HPOA forms? Yes   Social Determinants of Health   Financial Resource Strain:   . Difficulty of Paying Living Expenses:   Food Insecurity:   . Worried About Charity fundraiser in the Last Year:   . Arboriculturist in the Last Year:   Transportation Needs:   . Film/video editor (Medical):   Marland Kitchen Lack of Transportation (Non-Medical):   Physical Activity:   . Days of Exercise per Week:   . Minutes of Exercise per Session:   Stress:   .  Feeling of Stress :   Social Connections:   . Frequency of Communication with Friends and Family:   . Frequency of Social Gatherings with Friends and Family:   . Attends Religious Services:   . Active Member of Clubs or Organizations:   . Attends Archivist Meetings:   Marland Kitchen Marital Status:   Intimate Partner Violence:   . Fear of Current or Ex-Partner:   . Emotionally Abused:   Marland Kitchen Physically Abused:   . Sexually Abused:     Family History:    Family History  Problem Relation Age of Onset  . Coronary artery disease Mother   . Diabetes Mother   . Heart disease Mother   . Coronary artery disease Father   . Diabetes Father   . Lung cancer Father   . Heart disease Father   . Anuerysm Son   . Heart disease Brother   . Colon cancer Neg Hx   . Esophageal cancer Neg Hx      ROS:  Please see the history of present illness. Patient is challenging historian, vague. All other ROS reviewed and negative.      Physical Exam/Data:   Vitals:   08/26/19 0820 08/26/19 0900 08/26/19 1000 08/26/19 1008  BP:  (!) 80/41 (!) 74/43 (!) 62/35  Pulse:  72 70 70  Resp:  (!) 21 17 18   Temp: 98.2 F (36.8 C)     TempSrc: Oral     SpO2:  100% 100% 100%  Weight:      Height:        Intake/Output Summary (Last 24 hours) at 08/26/2019 1139 Last data filed at 08/26/2019 0830 Gross per 24 hour  Intake 3100 ml  Output 3700 ml  Net -600 ml   Last 3 Weights 08/26/2019 08/25/2019 08/16/2019  Weight (lbs) 185 lb 6.5 oz 175 lb 3.2 oz 178 lb 6.4 oz  Weight (kg) 84.1 kg 79.47 kg 80.922 kg     Body mass index is 26.6 kg/m.  Vital Signs. BP 94/58 (BP Location: Right Arm)   Pulse 70   Temp 98.2 F (36.8 C) (Oral)   Resp 18   Ht 5\' 10"  (1.778 m)   Wt 84.1 kg   SpO2 100%   BMI 26.60 kg/m  General: Frail appearing elderly WM in no acute distress Head: Normocephalic, atraumatic, sclera non-icteric, no xanthomas, nares are without discharge. Neck: Negative for carotid bruits. JVP not elevated. Lungs: Clear bilaterally to auscultation without wheezes, rales, or rhonchi. Breathing is unlabored. Heart: RRR S1 S2 with 2/6 SEM RUSB without rubs or gallops.  Abdomen: Soft, non-tender, non-distended with normoactive bowel sounds. No rebound/guarding. Extremities: No clubbing or cyanosis. No edema. Distal pedal pulses are 2+ and equal bilaterally. Neuro: Alert and oriented X 3. Moves all extremities spontaneously. Hard of hearing Psych:  Responds to questions appropriately with a normal affect.   EKG:  The EKG was personally reviewed and demonstrates:   1) suspected sinus tach with v pacing 111bpm 2) NSR 92bpm RBBB LAFB with NSST changes  Telemetry:  Telemetry was personally reviewed and demonstrates:  Atrial paced ventricular sensed rhythm  Relevant CV Studies: 2d echo 08/2017 Study Conclusions   - Left ventricle: The cavity size was normal. Wall thickness was  increased in a pattern of severe LVH.  Systolic function was  moderately reduced. The estimated ejection fraction was in the  range of 35% to 40%. Akinesis of the basalinferior myocardium.  Doppler parameters are consistent with a reversible restrictive  pattern, indicative of decreased left ventricular diastolic  compliance and/or increased left atrial pressure (grade 3  diastolic dysfunction).  - Aortic valve: There was moderate stenosis. There was mild  regurgitation. Valve area (VTI): 1.17 cm^2. Valve area (Vmax):  1.22 cm^2. Valve area (Vmean): 1.34 cm^2.   Laboratory Data:  High Sensitivity Troponin:   Recent Labs  Lab 08/26/19 0435 08/26/19 0801  TROPONINIHS 33* 47*     Chemistry Recent Labs  Lab 08/25/19 0001 08/26/19 0127 08/26/19 0801  NA 140 139 139  K 2.9* 2.9* 3.5  CL 99 99 102  CO2 26  --  16*  GLUCOSE 125* 198* 204*  BUN 15 19 23   CREATININE 0.87 1.10 1.53*  CALCIUM 9.0  --  8.6*  GFRNONAA >60  --  39*  GFRAA >60  --  46*  ANIONGAP 15  --  21*    No results for input(s): PROT, ALBUMIN, AST, ALT, ALKPHOS, BILITOT in the last 168 hours. Hematology Recent Labs  Lab 08/25/19 0001 08/26/19 0111 08/26/19 0127  WBC 12.1* 17.2*  --   RBC 3.95* 3.75*  --   HGB 13.0 12.4* 12.9*  HCT 38.9* 37.2* 38.0*  MCV 98.5 99.2  --   MCH 32.9 33.1  --   MCHC 33.4 33.3  --   RDW 14.3 14.5  --   PLT 258 303  --    BNPNo results for input(s): BNP, PROBNP in the last 168 hours.  DDimer No results for input(s): DDIMER in the last 168 hours.   Radiology/Studies:  DG Chest Portable 1 View  Result Date: 08/26/2019 CLINICAL DATA:  History of pain. EXAM: PORTABLE CHEST 1 VIEW COMPARISON:  CT abdomen 01/18/2018.  Chest x-ray 01/18/2018. FINDINGS: Cardiac pacer in stable position. Prior CABG. Stable cardiomegaly. No pulmonary venous congestion. Large hiatal hernia again noted. No focal infiltrate noted. No pleural effusion or pneumothorax. IMPRESSION: 1. Cardiac pacer stable position. Prior CABG.  Stable cardiomegaly. No acute abnormality identified. Chest is stable from prior exam. 2.  Large hiatal hernia again noted. Electronically Signed   By: Marcello Moores  Register   On: 08/26/2019 06:18   CT Renal Stone Study  Result Date: 08/26/2019 CLINICAL DATA:  Hematuria.  Lower abdominal pain. EXAM: CT ABDOMEN AND PELVIS WITHOUT CONTRAST TECHNIQUE: Multidetector CT imaging of the abdomen and pelvis was performed following the standard protocol without IV contrast. COMPARISON:  01/18/2018 FINDINGS: Lower chest: A large hiatal hernia is again partially visualized with the majority of the stomach being located in the chest. There is associated compressive atelectasis in the left lower lobe. Trace left pleural effusion, smaller than on the prior study. Partially visualized pacemaker leads. Hepatobiliary: Subcentimeter hypodensity in the left hepatic lobe, too small to fully characterize. Unremarkable gallbladder. No biliary dilatation. Pancreas: Unremarkable. Spleen: Unremarkable. Adrenals/Urinary Tract: Unremarkable right adrenal gland. Unchanged slight left adrenal nodularity. Left renal calculi measuring up to 5 mm in the lower pole. Vascular calcifications in the right renal hilum. Left renal cysts including an unchanged 5.1 cm cyst in the lower pole. Mild bilateral hydronephrosis and right hydroureter. Moderate bladder distension. 11 cm hyperdense mass dependently in the bladder with a small amount of gas likely reflecting a large hematoma. Foley catheter in place. Stomach/Bowel: There is no evidence of bowel obstruction or inflammation. 1 cm metallic density at the base of the cecum. Vascular/Lymphatic: Abdominal aortic atherosclerosis. Unchanged focal saccular dilatation of the infrarenal abdominal aorta with transverse diameter of 2.5 cm. No enlarged lymph nodes. Reproductive: Unremarkable prostate.  Other: Mild nonspecific stranding adjacent to the bladder and in the presacral region. No fluid collection or  pneumoperitoneum. Musculoskeletal: Chronic T11 compression fracture with progressive severe vertebral body height loss. Unchanged chronic L3 compression fracture with mild height loss. IMPRESSION: 1. Large bladder hematoma with Foley catheter in place. Stranding about the bladder could indicate hemorrhagic cystitis. An underlying mass is not excluded. Consider urology consultation. 2. Mild bilateral hydronephrosis. 3. Nonobstructing left nephrolithiasis. 4. Large hiatal hernia. 5. Aortic Atherosclerosis (ICD10-I70.0). Electronically Signed   By: Logan Bores M.D.   On: 08/26/2019 04:38    New York Heart Association (NYHA) Functional Class NYHA Class III  Assessment and Plan:   1. Recurrent hematuria with bladder hematoma, mild anemia, and hypotension - per IM/urology - s/p irrigation, being started on abx for possible sepsis - aspirin on hold - torsemide on hold due to hypotension - agree with resumption of midodrine  2. Tachycardia - per device interrogation was NOT ventricular tachycardia but instead sinus tachycardia with ventricular pacing response and pacemaker mediated tachycardia. Reassuring. Settings adjusted. Now atrial pacing on telemetry. - we will stop IV amiodarone and change back to home oral amiodarone dose - given known history of VT in the past, would still be vigilant about electrolyte maintenance and recommend to keep K 4.0 or greater (Mg was 3.1)  3. CAD s/p CABG remotely with minimally elevated troponin - troponin is low/flat, not felt to represent ACS at this time. - ASA on hold due to #1 - continue statin - does not appear to have been on BB at home  4. Chronic combined CHF - fluid status looks OK - not able to tolerate guideline directed management as OP due to h/o severe hypotension - torsemide on hold due to hypotension, resume when BP/Cr improves - will add strict I/o's and daily weights for tracking   5. Moderate AS by echo 2019 - repeat echo pending given  hypotension, tachycardia. Would not be surprised if there has been progression given age and previous assessment.  - further recs pending completion of echo  For questions or updates, please contact [place] Please consult [email or web address] for contact info under    Signed, Adam Pitter, PA-C  08/26/2019 11:39 AM  Patient seen and examined with Melina Copa PA-C.  Agree as above, with the following exceptions and changes as noted below. I was called urgently to the bedside for a systolic BP in the [id number] range and possible ventricular tachycardia at 4:30 am. He has a history of ischemic CM, ventricular tachycardia - scar mediated. Gen: sleeping, CV: RRR, 3/6 SEM, Lungs: clear, Abd: soft, Extrem: Warm, well perfused, no edema, Neuro/Psych: sleeping, unable to assess. All available labs, radiology testing, previous records reviewed.  I presented urgently to the bedside and echocardiogram was underway.  Pacemaker representative Adam [name] interrogated the device and found that at 4:30 AM the patient was ventricular pacing with pacemaker mediated tachycardia.  No ventricular tachycardia was detected, however patient's device is not set up to detect events under 150 beats a minute.  Nevertheless this is likely a benign finding and device adjustments were made.  He no longer requires IV amiodarone and can transition back to his home dose of oral amiodarone.  He has known moderate aortic valve stenosis, preliminary review of echocardiogram suggests it may now be moderate to severe, formal interpretation pending. We will follow up echocardiogram and provide further recommendations.  Adam Munroe, MD 08/26/19 12:34 PM

## 2019-08-26 NOTE — Progress Notes (Signed)
Pharmacy Antibiotic Note  Adam [name] is a 86 y.o. male admitted on 08/25/2019 with hematuria and sepsis.  Pharmacy has been consulted for cefepime and vancomycin dosing.  Plan: Cefepime 2 g iv q 12 h  Vancomycin 1750 mg iv once followed by 1000 mg iv q 24 hours (using 10-15 nomogram target due to age)  Follow renal function closely and adjust dosing as indicated F/U cultures Low threshold for stopping vancomycin if urinary source only  Height: 5\' 10"  (177.8 cm) Weight: 84.1 kg (185 lb 6.5 oz) IBW/kg (Calculated) : 73  Temp (24hrs), Avg:98.3 F (36.8 C), Min:97.3 F (36.3 C), Max:99.3 F (37.4 C)  Recent Labs  Lab 08/25/19 0001 08/26/19 0111 08/26/19 0127 08/26/19 0801  WBC 12.1* 17.2*  --   --   CREATININE 0.87  --  1.10 1.53*    Estimated Creatinine Clearance: 33.1 mL/min (A) (by C-G formula based on SCr of 1.53 mg/dL (H)).    Allergies  Allergen Reactions  . Altace [Ramipril] Cough  . Crestor [Rosuvastatin Calcium] Rash  . Penicillins Rash and Other (See Comments)    Has patient had a PCN reaction causing immediate rash, facial/tongue/throat swelling, SOB or lightheadedness with hypotension: Yes Has patient had a PCN reaction causing severe rash involving mucus membranes or skin necrosis: No Has patient had a PCN reaction that required hospitalization No Has patient had a PCN reaction occurring within the last 10 years: No If all of the above answers are "NO", then may proceed with Cephalosporin use.   . Sulfa Antibiotics Rash  . Sulfamethoxazole-Trimethoprim Rash    Antimicrobials this admission: 7/22 ceftriaxone x 1 7/22 vancomycin >> 7/22 cefepime >>   Dose adjustments this admission:  Microbiology results: 7/22 BCx: sent 7/22 UCx: sent  7/22 Covid: neg 7/22 MRSA PCR: sent  Thank you for allowing pharmacy to be a part of this patient's care.  Ulice Dash D 08/26/2019 10:59 AM

## 2019-08-26 NOTE — ED Provider Notes (Signed)
[facility] DEPT Provider Note   CSN: [id number] Arrival date & time: 08/25/19  2333     History No chief complaint on file.   Adam [name] is a 86 y.o. male.  The history is provided by the patient.  Hematuria This is a recurrent problem. The current episode started more than 2 days ago. The problem occurs constantly. The problem has not changed since onset.Associated symptoms include abdominal pain. Pertinent negatives include no chest pain, no headaches and no shortness of breath. Nothing aggravates the symptoms. Nothing relieves the symptoms. He has tried nothing for the symptoms. The treatment provided no relief.  Patient with h/o VTach and ischemic cardiomyopathy presents for hematuria. Patient was seen last night for hematuria and hypokalemia and foley was irrigated in the department but patient continues to experience hematuria and bladder spasm and doctor at the nursing home returned patient to the ED for same.  Patient is a DNR/ DNI.       Past Medical History:  Diagnosis Date  . Arthritis    "minor, back and sometimes knees" (12/15/2012)  . Bradycardia    AFib/SSS s/p St Jude PPM 04/12/2008  . CAD (coronary artery disease) 12/30/2014   CABG (LIMA-LAD, SVG-RCA, SVG-OM in 1996).  07/2009 BMS to SVG-RCA. Cath in 04/2010 with patent stents   . Cardiomyopathy, ischemic 08/25/2012  . CHF (congestive heart failure) ([place])   . Chronic knee pain 12/03/2014  . Combined congestive systolic and diastolic heart failure ([place]) 02/02/2015   Hx EF 41%. BNP 96.8 02/21/15 Torsemide 04/06/15 Na 142, K 4.6, Bun 16, creat 0.89 04/20/14 BNP 111.7, Na 142, K 4.6, Bun 16, creat 0.9   . Depression with anxiety 02/02/2015   02/21/15 Hgb A1c 5.8 03/10/15 MMSE 30/30   . Dizziness, after diuretic asscoiated with hypotension and responded to fluid bolus 06/05/2011   04/28/15 US carotid R+L normal bilateral arterial velocities.    Marland Kitchen Dyspnea 08/11/2014   Followed in Pulmonary clinic/  [facility]/ [place]  - 08/11/2014  Walked RA x 1 laps @ 185 ft each stopped due to fatigue/off balance/ slow pace/  no sob or desat  - PFT's  09/26/2014  FEV1 2.26 (85 % ) ratio 76  p no % improvement from saba with DLCO  67 % corrects to 93 % for alv volume      Since prev study 08/04/13 minimal change lung vol or dlco    . Embolic cerebral infarction ([place]) 12/06/2015  . Exertional shortness of breath    "sometimes walking" (12/15/2012)  . GERD (gastroesophageal reflux disease)   . Gout 02/09/2015  . Heart murmur    "just told I had one today" (12/15/2012)  . Hiatal hernia   . Hyperlipidemia   . Hypertension   . Hypothyroid   . Influenza A 03/10/2016  . Insomnia   . Melanoma of back ([place]) 1976  . Myocardial infarction [facility]) 1996; 2011   "both silent" (12/15/2012)  . Nonrheumatic aortic valve stenosis   . Orthostatic hypotension   . Osteoporosis, senile   . Pacemaker   . RBBB   . Restless leg 02/02/2015  . Right leg weakness 12/06/2015  . [facility] spotted fever   . S/P CABG x 4   . Sick sinus syndrome ([place]) 01/31/2014  . Sustained ventricular tachycardia ([place]) 07/27/2014  . Urinary retention 12/30/2014   has a cathrater    Patient Active Problem List   Diagnosis Date Noted  . Advanced care planning/counseling discussion 08/25/2019  .  Hematuria 08/16/2019  . Allergic rhinitis 05/21/2019  . Weight loss 04/06/2019  . Seborrheic dermatitis 04/06/2019  . Edema 10/16/2018  . Stye 10/13/2018  . Ecchymosis of eye 10/13/2018  . T11 vertebral fracture ([place]) 02/13/2018  . L3 vertebral fracture ([place]) 02/13/2018  . Bilateral renal cysts 02/13/2018  . Spondylosis of cervical region without myelopathy or radiculopathy 09/25/2017  . Tear of left rotator cuff 09/25/2017  . Pharyngeal dysphagia 09/08/2017  . Generalized weakness 08/25/2017  . Right shoulder pain 08/25/2017  . Neurogenic bladder 07/21/2017  . Mild cognitive impairment 07/21/2017  . Neurogenic orthostatic hypotension  ([place]) 04/21/2017  . Osteoarthritis 04/23/2016  . Gingivitis 04/15/2016  . History of arterial ischemic stroke 03/05/2016  . Aortic valve stenosis, nonrheumatic   . TIA (transient ischemic attack) 01/10/2016  . Hx of CABG 01/10/2016  . Insomnia 03/23/2015  . Gout 02/09/2015  . Depression, recurrent ([place]) 02/02/2015  . RLS (restless legs syndrome) 02/02/2015  . Combined congestive systolic and diastolic heart failure ([place]) 02/02/2015  . Urinary retention 12/30/2014  . Coronary artery disease involving native coronary artery of native heart without angina pectoris 12/30/2014  . Expressive aphasia 12/30/2014  . Chronic constipation   . Orthostatic hypotension   . Chronic knee pain 12/03/2014  . GERD (gastroesophageal reflux disease)   . Dyspnea on exertion 08/11/2014  . Hypothyroidism 08/11/2014  . Ventricular tachycardia ([place]) 07/27/2014  . Upper airway cough syndrome 07/14/2014  . SSS (sick sinus syndrome) ([place]) 01/31/2014  . Hypercholesterolemia 01/30/2014  . Cardiomyopathy, ischemic 08/25/2012  . Pacemaker 08/25/2012    Past Surgical History:  Procedure Laterality Date  . CARDIAC CATHETERIZATION  04/2010   LIMA to LAD patent,SVG to OM patent,no in-stnet restenosis RCA  . CATARACT EXTRACTION W/ INTRAOCULAR LENS  IMPLANT, BILATERAL Bilateral 2012  . CORONARY ANGIOPLASTY WITH STENT PLACEMENT  07/2009   bare metal stent to SVG to the RCA  . CORONARY ARTERY BYPASS GRAFT  1996   LIMA to LAD,SVG to RCA & SVG to OM  . INSERT / REPLACE / REMOVE PACEMAKER  2010  . MELANOMA EXCISION  05/1974 X2   "taken off my back" (12/15/2012)  . NM MYOVIEW LTD  06/2011   low risk  . PPM GENERATOR CHANGEOUT N/A 01/22/2017   Procedure: PPM GENERATOR CHANGEOUT;  Surgeon: [name], MD;  Location: [facility];  Service: Cardiovascular;  Laterality: N/A;  . TONSILLECTOMY  1938  . TRANSURETHRAL RESECTION OF PROSTATE  1986  . US ECHOCARDIOGRAPHY  07/11/2009   EF 45-50%       Family  History  Problem Relation Age of Onset  . Coronary artery disease Mother   . Diabetes Mother   . Heart disease Mother   . Coronary artery disease Father   . Diabetes Father   . Lung cancer Father   . Heart disease Father   . Anuerysm Son   . Heart disease Brother   . Colon cancer Neg Hx   . Esophageal cancer Neg Hx     Social History   Tobacco Use  . Smoking status: Never Smoker  . Smokeless tobacco: Never Used  Vaping Use  . Vaping Use: Never used  Substance Use Topics  . Alcohol use: No    Alcohol/week: 0.0 standard drinks  . Drug use: No    Home Medications Prior to Admission medications   Medication Sig Start Date End Date Taking? Authorizing Provider  acetaminophen (TYLENOL) 500 MG tablet Take 500 mg by mouth 3 (three) times daily.  Yes [provider]  Amino Acids-Protein Hydrolys (FEEDING SUPPLEMENT, PRO-STAT SUGAR FREE 64,) LIQD Take 30 mLs by mouth daily.    Yes [provider]  amiodarone (PACERONE) 100 MG tablet Take 1 tablet (100 mg total) by mouth daily. 01/16/17  Yes Croitoru, Mihai, MD  aspirin EC 81 MG tablet Take 1 tablet (81 mg total) by mouth daily. 01/12/16  Yes Reyne Dumas, MD  Cholecalciferol (VITAMIN D3) 5000 UNITS CAPS Take 5,000 Units by mouth every Monday.    Yes [provider]  clotrimazole-betamethasone (LOTRISONE) cream Apply 1 application topically daily as needed.   Yes [provider]  Droxidopa ([name]) 100 MG CAPS TAKE 1 CAPSULE BY MOUTH THREE TIMES DAILY. TAKE LAST DOSE AT LEAST 3 HOURS BEFORE BEDTIME. Patient taking differently: Take 100 mg by mouth in the morning, at noon, and at bedtime. TAKE LAST DOSE AT LEAST 3 HOURS BEFORE BEDTIME. 12/02/18  Yes Croitoru, Mihai, MD  DULoxetine (CYMBALTA) 20 MG capsule Take 20 mg by mouth daily. For anxiety,insomnia and back pain   Yes [provider]  fluticasone (FLONASE) 50 MCG/ACT nasal spray Place 1 spray into both nostrils 2 (two) times daily.    Yes [provider]  Glycerin (ORAJEL DRY MOUTH MT) Use as directed in the mouth or throat. Swish and spit 10 ml 4 x day as needed for sore gums. Continue until dentures fit and/or next dental visit. Four Times A Day - PRN   Yes [provider]  guaiFENesin (ROBITUSSIN) 100 MG/5ML SOLN Take 5 mLs (100 mg total) by mouth every 4 (four) hours as needed for cough or to loosen phlegm. 09/04/17  Yes Lavina Hamman, MD  ketoconazole (NIZORAL) 2 % cream Apply 1 application topically as needed for irritation. Apply small amount behind ears and in creases beside nose BID x 7 days. MAY REPEAT PRN. (Family supplied).   Yes [provider]  levothyroxine (SYNTHROID, LEVOTHROID) 100 MCG tablet Take 100 mcg by mouth daily before breakfast.   Yes [provider]  meclizine (ANTIVERT) 25 MG tablet Take 1 tablet (25 mg total) by mouth 2 (two) times daily as needed for dizziness. 09/04/17  Yes Lavina Hamman, MD  Melatonin 3 MG TABS Take 3 mg by mouth at bedtime.   Yes [provider]  methocarbamol (ROBAXIN) 500 MG tablet Take 250 mg by mouth 2 (two) times daily as needed for muscle spasms.    Yes [provider]  midodrine (PROAMATINE) 5 MG tablet Take 5 mg by mouth 3 (three) times daily with meals. Notify provider if BP is < 90/60.   Yes [provider]  mineral oil-hydrophilic petrolatum (AQUAPHOR) ointment Apply 1 application topically 2 (two) times daily as needed for dry skin. To face   Yes [provider]  pantoprazole (PROTONIX) 40 MG tablet Take 40 mg by mouth daily.   Yes [provider]  Plecanatide (TRULANCE) 3 MG TABS Take 3 mg by mouth daily. 01/22/18  Yes Cirigliano, Vito V, DO  polyethylene glycol (MIRALAX / GLYCOLAX) 17 g packet Take 17 g by mouth daily as needed for mild constipation. Take 1-2 capfuls in 8 oz of H2O    Yes [provider]  pravastatin (PRAVACHOL) 40 MG tablet Take 1 tablet (40 mg total) by mouth at  bedtime. 10/21/16  Yes Blanchie Serve, MD  rOPINIRole (REQUIP) 0.25 MG tablet Take 0.5 mg by mouth 2 (two) times daily.   Yes [provider]  rOPINIRole (REQUIP) 0.5  MG tablet Take 0.5 mg by mouth daily as needed.   Yes [provider]  sennosides-docusate sodium (SENOKOT-S) 8.6-50 MG tablet Take 2 tablets by mouth 2 (two) times daily.    Yes [provider]  torsemide (DEMADEX) 10 MG tablet Take 20 mg by mouth daily. 20 mg once a day   Yes [provider]    Allergies    Altace [ramipril], Crestor [rosuvastatin calcium], Penicillins, Sulfa antibiotics, and Sulfamethoxazole-trimethoprim  Review of Systems   Review of Systems  Constitutional: Negative for unexpected weight change.  HENT: Negative for congestion.   Eyes: Negative for visual disturbance.  Respiratory: Negative for shortness of breath.   Cardiovascular: Negative for chest pain.  Gastrointestinal: Positive for abdominal pain.  Genitourinary: Positive for hematuria and penile pain.  Musculoskeletal: Negative for arthralgias.  Neurological: Negative for headaches.  Psychiatric/Behavioral: Negative for agitation.  All other systems reviewed and are negative.   Physical Exam Updated Vital Signs BP 97/83   Pulse 99   Temp 98.2 F (36.8 C) (Oral)   Resp (!) 25   SpO2 96%   Physical Exam Vitals and nursing note reviewed.  Constitutional:      General: He is not in acute distress.    Appearance: Normal appearance.  HENT:     Head: Normocephalic and atraumatic.     Nose: Nose normal.  Eyes:     Conjunctiva/sclera: Conjunctivae normal.     Pupils: Pupils are equal, round, and reactive to light.  Cardiovascular:     Rate and Rhythm: Normal rate and regular rhythm.     Pulses: Normal pulses.     Heart sounds: Normal heart sounds.  Pulmonary:     Effort: Pulmonary effort is normal.     Breath sounds: Normal breath sounds.  Abdominal:     General: Abdomen is flat. Bowel sounds are  normal.     Palpations: Abdomen is soft.     Tenderness: There is no abdominal tenderness. There is no guarding or rebound.  Musculoskeletal:        General: Normal range of motion.     Cervical back: Normal range of motion and neck supple.  Skin:    General: Skin is warm and dry.     Capillary Refill: Capillary refill takes less than 2 seconds.  Neurological:     General: No focal deficit present.     Mental Status: He is alert.     Deep Tendon Reflexes: Reflexes normal.  Psychiatric:        Mood and Affect: Mood normal.     ED Results / Procedures / Treatments   Labs (all labs ordered are listed, but only abnormal results are displayed) Results for orders placed or performed during the hospital encounter of 08/25/19  CBC with Differential/Platelet  Result Value Ref Range   WBC 17.2 (H) 4.0 - 10.5 K/uL   RBC 3.75 (L) 4.22 - 5.81 MIL/uL   Hemoglobin 12.4 (L) 13.0 - 17.0 g/dL   HCT 37.2 (L) 39 - 52 %   MCV 99.2 80.0 - 100.0 fL   MCH 33.1 26.0 - 34.0 pg   MCHC 33.3 30.0 - 36.0 g/dL   RDW 14.5 11.5 - 15.5 %   Platelets 303 150 - 400 K/uL   nRBC 0.0 0.0 - 0.2 %   Neutrophils Relative % 76 %   Neutro Abs 13.2 (H) 1.7 - 7.7 K/uL   Lymphocytes Relative 15 %   Lymphs Abs 2.6 0.7 - 4.0 K/uL  Monocytes Relative 7 %   Monocytes Absolute 1.2 (H) 0 - 1 K/uL   Eosinophils Relative 1 %   Eosinophils Absolute 0.1 0 - 0 K/uL   Basophils Relative 0 %   Basophils Absolute 0.1 0 - 0 K/uL   Immature Granulocytes 1 %   Abs Immature Granulocytes 0.12 (H) 0.00 - 0.07 K/uL  Protime-INR  Result Value Ref Range   Prothrombin Time 12.7 11.4 - 15.2 seconds   INR 1.0 0.8 - 1.2  I-stat chem 8, ED (not at [facility] or [facility])  Result Value Ref Range   Sodium 139 135 - 145 mmol/L   Potassium 2.9 (L) 3.5 - 5.1 mmol/L   Chloride 99 98 - 111 mmol/L   BUN 19 8 - 23 mg/dL   Creatinine, Ser 1.10 0.61 - 1.24 mg/dL   Glucose, Bld 198 (H) 70 - 99 mg/dL   Calcium, Ion 1.03 (L) 1.15 - 1.40 mmol/L   TCO2 24  22 - 32 mmol/L   Hemoglobin 12.9 (L) 13.0 - 17.0 g/dL   HCT 38.0 (L) 39 - 52 %   CUP PACEART REMOTE DEVICE CHECK  Result Date: 08/10/2019 Scheduled remote reviewed. Normal device function.  Next remote 91 days- JBox, RN/CVRS   Radiology No results found.  Procedures Procedures (including critical care time)  Medications Ordered in ED Medications  oxybutynin (DITROPAN-XL) 24 hr tablet 10 mg (10 mg Oral Given 08/26/19 0250)  fentaNYL (SUBLIMAZE) injection 50 mcg (has no administration in time range)  potassium chloride 10 mEq in 100 mL IVPB (has no administration in time range)  fentaNYL (SUBLIMAZE) injection 25 mcg (25 mcg Intravenous Given 08/26/19 0111)  magnesium sulfate IVPB 2 g 50 mL (0 g Intravenous Stopped 08/26/19 0306)  potassium chloride SA (KLOR-CON) CR tablet 80 mEq (80 mEq Oral Given 08/26/19 0244)    ED Course  I have reviewed the triage vital signs and the nursing notes.  Pertinent labs & imaging results that were available during my care of the patient were reviewed by me and considered in my medical decision making (see chart for details).   445 informed patient of Vtach  Episode. This broke spontaneously and the patient had a pulse the entire time.    Immediately amiodarone was ordered and cardiology was consulted.  449 Case d/w Cardiology who recommend getting potassium to normal as this is the inciting event though patient also has known cardiomyopathy.  Agrees with amiodarone and admission to [facility], can stay at [facility] cardiology can consult at [facility]  529 am case d/w Dr. Matilde Sprang of urology.  CBI to continue and Dr. Matilde Sprang to consult regarding bladder hematoma   MDM Reviewed: previous chart, nursing note and vitals Reviewed previous: labs Interpretation: labs, ECG, x-ray and CT scan (hypokalemia, UTI) Total time providing critical care: 75-105 minutes (amiodarone drip, continuous bladder irrigation ). This excludes time spent performing separately reportable  procedures and services. Consults: admitting MD, cardiology and urology  CRITICAL CARE Performed by: Pio Eatherly K Shaquan Missey-Rasch Total critical care time: 75  minutes Critical care time was exclusive of separately billable procedures and treating other patients. Critical care was necessary to treat or prevent imminent or life-threatening deterioration. Critical care was time spent personally by me on the following activities: development of treatment plan with patient and/or surrogate as well as nursing, discussions with consultants, evaluation of patient's response to treatment, examination of patient, obtaining history from patient or surrogate, ordering and performing treatments and interventions, ordering and review of laboratory studies, ordering  and review of radiographic studies, pulse oximetry and re-evaluation of patient's condition.   EDWORD CU was evaluated in Emergency Department on 08/26/2019 for the symptoms described in the history of present illness. He was evaluated in the context of the global COVID-19 pandemic, which necessitated consideration that the patient might be at risk for infection with the SARS-CoV-2 virus that causes COVID-19. Institutional protocols and algorithms that pertain to the evaluation of patients at risk for COVID-19 are in a state of rapid change based on information released by regulatory bodies including the CDC and federal and state organizations. These policies and algorithms were followed during the patient's care in the ED.  Final Clinical Impression(s) / ED Diagnoses Admit to medicine    Comfort Iversen, MD 08/26/19 1610

## 2019-08-26 NOTE — Progress Notes (Signed)
We irrigate mild clots at noon today Looks good now Will follow Agree with antiboitics with c/s pending

## 2019-08-26 NOTE — ED Notes (Signed)
Notified hospitalist of BP 81/56 MAP 65.

## 2019-08-26 NOTE — Consult Note (Signed)
Urology Consult  Referring physician: A Columbo Reason for referral: Gross hematuria  Chief Complaint: Gross hematuria  History of Present Illness: Repeat ER visits for gross hematuria and clot evacuation/irrigation; Pt known to Urology for chronic retention with foley; previous TUR; previous hematuria and penile pain  Low abdominal pain unable to flush foley  Similar episode in ER 10 days ago but no blood but in retention  Medical co-morbidities  On ASA  CBI placed and irrigated to pink; went into Vtach- no hypotension and placed on amiodarone  Elevated WBC and Troponin (cardiology consulted); normal CR  CT scan: Mild bilateral hydronephrosis and right hydroureter. Moderate bladder distension. 11 cm hyperdense mass dependently in the bladder with a small amount of gas likely reflecting a large hematoma. Foley catheter in place. Large bladder hematoma with Foley catheter in place. Stranding about the bladder could indicate hemorrhagic cystitis. An underlying mass is not excluded. Consider urology consultation.      Past Medical History:  Diagnosis Date  . Arthritis    "minor, back and sometimes knees" (12/15/2012)  . Bradycardia    AFib/SSS s/p St Jude PPM 04/12/2008  . CAD (coronary artery disease) 12/30/2014   CABG (LIMA-LAD, SVG-RCA, SVG-OM in 1996).  07/2009 BMS to SVG-RCA. Cath in 04/2010 with patent stents   . Cardiomyopathy, ischemic 08/25/2012  . CHF (congestive heart failure) ([place])   . Chronic knee pain 12/03/2014  . Combined congestive systolic and diastolic heart failure ([place]) 02/02/2015   Hx EF 41%. BNP 96.8 02/21/15 Torsemide 04/06/15 Na 142, K 4.6, Bun 16, creat 0.89 04/20/14 BNP 111.7, Na 142, K 4.6, Bun 16, creat 0.9   . Depression with anxiety 02/02/2015   02/21/15 Hgb A1c 5.8 03/10/15 MMSE 30/30   . Dizziness, after diuretic asscoiated with hypotension and responded to fluid bolus 06/05/2011   04/28/15 US carotid R+L normal bilateral arterial velocities.    Marland Kitchen  Dyspnea 08/11/2014   Followed in Pulmonary clinic/ [facility]/ [place]  - 08/11/2014  Walked RA x 1 laps @ 185 ft each stopped due to fatigue/off balance/ slow pace/  no sob or desat  - PFT's  09/26/2014  FEV1 2.26 (85 % ) ratio 76  p no % improvement from saba with DLCO  67 % corrects to 93 % for alv volume      Since prev study 08/04/13 minimal change lung vol or dlco    . Embolic cerebral infarction ([place]) 12/06/2015  . Exertional shortness of breath    "sometimes walking" (12/15/2012)  . GERD (gastroesophageal reflux disease)   . Gout 02/09/2015  . Heart murmur    "just told I had one today" (12/15/2012)  . Hiatal hernia   . Hyperlipidemia   . Hypertension   . Hypothyroid   . Influenza A 03/10/2016  . Insomnia   . Melanoma of back ([place]) 1976  . Myocardial infarction [facility]) 1996; 2011   "both silent" (12/15/2012)  . Nonrheumatic aortic valve stenosis   . Orthostatic hypotension   . Osteoporosis, senile   . Pacemaker   . RBBB   . Restless leg 02/02/2015  . Right leg weakness 12/06/2015  . [facility] spotted fever   . S/P CABG x 4   . Sick sinus syndrome ([place]) 01/31/2014  . Sustained ventricular tachycardia ([place]) 07/27/2014  . Urinary retention 12/30/2014   has a cathrater   Past Surgical History:  Procedure Laterality Date  . CARDIAC CATHETERIZATION  04/2010   LIMA to LAD patent,SVG to OM patent,no in-stnet  restenosis RCA  . CATARACT EXTRACTION W/ INTRAOCULAR LENS  IMPLANT, BILATERAL Bilateral 2012  . CORONARY ANGIOPLASTY WITH STENT PLACEMENT  07/2009   bare metal stent to SVG to the RCA  . CORONARY ARTERY BYPASS GRAFT  1996   LIMA to LAD,SVG to RCA & SVG to OM  . INSERT / REPLACE / REMOVE PACEMAKER  2010  . MELANOMA EXCISION  05/1974 X2   "taken off my back" (12/15/2012)  . NM MYOVIEW LTD  06/2011   low risk  . PPM GENERATOR CHANGEOUT N/A 01/22/2017   Procedure: PPM GENERATOR CHANGEOUT;  Surgeon: [name], MD;  Location: [place] CV LAB;  Service: Cardiovascular;   Laterality: N/A;  . TONSILLECTOMY  1938  . TRANSURETHRAL RESECTION OF PROSTATE  1986  . US ECHOCARDIOGRAPHY  07/11/2009   EF 45-50%    Medications: I have reviewed the patient's current medications. Allergies:  Allergies  Allergen Reactions  . Altace [Ramipril] Cough  . Crestor [Rosuvastatin Calcium] Rash  . Penicillins Rash and Other (See Comments)    Has patient had a PCN reaction causing immediate rash, facial/tongue/throat swelling, SOB or lightheadedness with hypotension: Yes Has patient had a PCN reaction causing severe rash involving mucus membranes or skin necrosis: No Has patient had a PCN reaction that required hospitalization No Has patient had a PCN reaction occurring within the last 10 years: No If all of the above answers are "NO", then may proceed with Cephalosporin use.   . Sulfa Antibiotics Rash  . Sulfamethoxazole-Trimethoprim Rash    Family History  Problem Relation Age of Onset  . Coronary artery disease Mother   . Diabetes Mother   . Heart disease Mother   . Coronary artery disease Father   . Diabetes Father   . Lung cancer Father   . Heart disease Father   . Anuerysm Son   . Heart disease Brother   . Colon cancer Neg Hx   . Esophageal cancer Neg Hx    Social History:  reports that he has never smoked. He has never used smokeless tobacco. He reports that he does not drink alcohol and does not use drugs.  ROS: All systems are reviewed and negative except as noted. Ret negative  Physical Exam:  Vital signs in last 24 hours: Temp:  [97.3 F (36.3 C)-98.2 F (36.8 C)] 98.2 F (36.8 C) (07/21 2352) Pulse Rate:  [73-99] 76 (07/22 0615) Resp:  [18-25] 22 (07/22 0615) BP: (97-141)/(54-93) 102/54 (07/22 0615) SpO2:  [94 %-98 %] 94 % (07/22 0615) Weight:  [79.5 kg] 79.5 kg (07/21 1421)  Cardiovascular: Skin warm; not flushed Respiratory: Breaths quiet; no shortness of breath Abdomen: No masses Neurological: Normal sensation to touch Musculoskeletal:  Normal motor function arms and legs Lymphatics: No inguinal adenopathy Skin: No rashes Genitourinary:no distress on drip 22 three way foley  Laboratory Data:  Results for orders placed or performed during the hospital encounter of 08/25/19 (from the past 72 hour(s))  CBC with Differential/Platelet     Status: Abnormal   Collection Time: 08/26/19  1:11 AM  Result Value Ref Range   WBC 17.2 (H) 4.0 - 10.5 K/uL   RBC 3.75 (L) 4.22 - 5.81 MIL/uL   Hemoglobin 12.4 (L) 13.0 - 17.0 g/dL   HCT 37.2 (L) 39 - 52 %   MCV 99.2 80.0 - 100.0 fL   MCH 33.1 26.0 - 34.0 pg   MCHC 33.3 30.0 - 36.0 g/dL   RDW 14.5 11.5 - 15.5 %   Platelets  303 150 - 400 K/uL   nRBC 0.0 0.0 - 0.2 %   Neutrophils Relative % 76 %   Neutro Abs 13.2 (H) 1.7 - 7.7 K/uL   Lymphocytes Relative 15 %   Lymphs Abs 2.6 0.7 - 4.0 K/uL   Monocytes Relative 7 %   Monocytes Absolute 1.2 (H) 0 - 1 K/uL   Eosinophils Relative 1 %   Eosinophils Absolute 0.1 0 - 0 K/uL   Basophils Relative 0 %   Basophils Absolute 0.1 0 - 0 K/uL   Immature Granulocytes 1 %   Abs Immature Granulocytes 0.12 (H) 0.00 - 0.07 K/uL    Comment: Performed at [facility], [place] [address], [place], [place] [address]  Protime-INR     Status: None   Collection Time: 08/26/19  1:11 AM  Result Value Ref Range   Prothrombin Time 12.7 11.4 - 15.2 seconds   INR 1.0 0.8 - 1.2    Comment: (NOTE) INR goal varies based on device and disease states. Performed at [facility], [place] [address], [place], [place] [address]   I-stat chem 8, ED (not at [facility] or [facility])     Status: Abnormal   Collection Time: 08/26/19  1:27 AM  Result Value Ref Range   Sodium 139 135 - 145 mmol/L   Potassium 2.9 (L) 3.5 - 5.1 mmol/L   Chloride 99 98 - 111 mmol/L   BUN 19 8 - 23 mg/dL   Creatinine, Ser 1.10 0.61 - 1.24 mg/dL   Glucose, Bld 198 (H) 70 - 99 mg/dL    Comment: Glucose reference range applies only to samples taken after fasting for at  least 8 hours.   Calcium, Ion 1.03 (L) 1.15 - 1.40 mmol/L   TCO2 24 22 - 32 mmol/L   Hemoglobin 12.9 (L) 13.0 - 17.0 g/dL   HCT 38.0 (L) 39 - 52 %  Urinalysis, Routine w reflex microscopic     Status: Abnormal   Collection Time: 08/26/19  2:16 AM  Result Value Ref Range   Color, Urine RED (A) YELLOW   APPearance TURBID (A) CLEAR   Specific Gravity, Urine 1.005 1.005 - 1.030   pH 6.0 5.0 - 8.0   Glucose, UA NEGATIVE NEGATIVE mg/dL   Hgb urine dipstick MODERATE (A) NEGATIVE   Bilirubin Urine NEGATIVE NEGATIVE   Ketones, ur NEGATIVE NEGATIVE mg/dL   Protein, ur 100 (A) NEGATIVE mg/dL   Nitrite NEGATIVE NEGATIVE   Leukocytes,Ua SMALL (A) NEGATIVE   RBC / HPF >50 (H) 0 - 5 RBC/hpf   WBC, UA >50 (H) 0 - 5 WBC/hpf   Bacteria, UA NONE SEEN NONE SEEN   WBC Clumps PRESENT     Comment: Performed at [facility], [place] [address] [place], [place] [address]  SARS Coronavirus 2 by RT PCR (hospital order, performed in [facility] hospital lab) Nasopharyngeal Nasopharyngeal Swab     Status: None   Collection Time: 08/26/19  3:12 AM   Specimen: Nasopharyngeal Swab  Result Value Ref Range   SARS Coronavirus 2 NEGATIVE NEGATIVE    Comment: (NOTE) SARS-CoV-2 target nucleic acids are NOT DETECTED.  The SARS-CoV-2 RNA is generally detectable in upper and lower respiratory specimens during the acute phase of infection. The lowest concentration of SARS-CoV-2 viral copies this assay can detect is 250 copies / mL. A negative result does not preclude SARS-CoV-2 infection and should not be used as the sole basis for treatment or other patient management decisions.  A negative result may occur with improper specimen collection / handling, submission of specimen other than nasopharyngeal swab, presence of viral mutation(s) within the areas targeted by this assay, and inadequate number of viral copies (<250 copies / mL). A negative result must be combined with clinical observations,  patient history, and epidemiological information.  Fact Sheet for Patients:   StrictlyIdeas.no  Fact Sheet for Healthcare Providers: BankingDealers.co.za  This test is not yet approved or  cleared by the [place] FDA and has been authorized for detection and/or diagnosis of SARS-CoV-2 by FDA under an Emergency Use Authorization (EUA).  This EUA will remain in effect (meaning this test can be used) for the duration of the COVID-19 declaration under Section 564(b)(1) of the Act, 21 U.S.C. section 360bbb-3(b)(1), unless the authorization is terminated or revoked sooner.  Performed at [facility], [place] [address], [place], [place] [address]   Troponin I (High Sensitivity)     Status: Abnormal   Collection Time: 08/26/19  4:35 AM  Result Value Ref Range   Troponin I (High Sensitivity) 33 (H) <18 ng/L    Comment: (NOTE) Elevated high sensitivity troponin I (hsTnI) values and significant  changes across serial measurements may suggest ACS but many other  chronic and acute conditions are known to elevate hsTnI results.  Refer to the "Links" section for chest pain algorithms and additional  guidance. Performed at [facility], [place] [address], [place], [place] [address]   Magnesium     Status: Abnormal   Collection Time: 08/26/19  4:35 AM  Result Value Ref Range   Magnesium 3.1 (H) 1.7 - 2.4 mg/dL    Comment: Performed at [facility], [place] [address], [place], [place] [address]  Phosphorus     Status: Abnormal   Collection Time: 08/26/19  4:35 AM  Result Value Ref Range   Phosphorus 4.8 (H) 2.5 - 4.6 mg/dL    Comment: Performed at [facility], [place] [address] [place], [place] [address]   Recent Results (from the past 240 hour(s))  SARS Coronavirus 2 by RT PCR (hospital order, performed in [facility] hospital lab) Nasopharyngeal Nasopharyngeal Swab      Status: None   Collection Time: 08/26/19  3:12 AM   Specimen: Nasopharyngeal Swab  Result Value Ref Range Status   SARS Coronavirus 2 NEGATIVE NEGATIVE Final    Comment: (NOTE) SARS-CoV-2 target nucleic acids are NOT DETECTED.  The SARS-CoV-2 RNA is generally detectable in upper and lower respiratory specimens during the acute phase of infection. The lowest concentration of SARS-CoV-2 viral copies this assay can detect is 250 copies / mL. A negative result does not preclude SARS-CoV-2 infection and should not be used as the sole basis for treatment or other patient management decisions.  A negative result may occur with improper specimen collection / handling, submission of specimen other than nasopharyngeal swab, presence of viral mutation(s) within the areas targeted by this assay, and inadequate number of viral copies (<250 copies / mL). A negative result must be combined with clinical observations, patient history, and epidemiological information.  Fact Sheet for Patients:   StrictlyIdeas.no  Fact Sheet for Healthcare Providers: BankingDealers.co.za  This test is not yet approved or  cleared by the [place] FDA and has been authorized for detection and/or diagnosis of SARS-CoV-2 by FDA under an Emergency Use Authorization (EUA).  This EUA will remain in effect (meaning this test can be used) for the duration of the COVID-19  declaration under Section 564(b)(1) of the Act, 21 U.S.C. section 360bbb-3(b)(1), unless the authorization is terminated or revoked sooner.  Performed at [facility], [place] [address] [place], [place] [address]    Creatinine: Recent Labs    08/25/19 0001 08/26/19 0127  CREATININE 0.87 1.10    Xrays: See report/chart As noted  Impression/Assessment:  Chronic retention; clot retention; large clot  Plan:  Irrigated two liters - extensive clots thick but reaonsably easy to  irrigare CBI Irrigate more today by nurses and urology Not a good surgical patient is possible  Event organiser A Macdiarmid 08/26/2019, 7:09 AM

## 2019-08-26 NOTE — Progress Notes (Signed)
  Echocardiogram 2D Echocardiogram has been performed.  Adam [name] [name] 08/26/2019, 12:20 PM

## 2019-08-26 NOTE — TOC Initial Note (Signed)
Transition of Care [facility]) - Initial/Assessment Note    Patient Details  Name: Adam [name] MRN: [id number] Date of Birth: 1927-01-17  Transition of Care [facility]) CM/SW Contact:    Leeroy Cha, RN Phone Number: 08/26/2019, 3:30 PM  ClinicaNarrative: 86 year old male with past medical history of CAD s/p CABG in 1996 with extensive scarring of inferior wall MI, ischemic cardiomyopathy with chronic combined systolic and diastolic heart failure, sustained symptomatic VT on amiodarone, ischemic stroke, sinus node dysfunction with dual-chamber permanent pacemaker, severe orthostatic hypotension thought to be neurogenic and on midodrine, arthritis, hyperlipidemia, moderate AS, chronic urinary retention with indwelling Foley and previous TUR, previous hematuria and penile pain with multiple recent ED visits for penile and abdominal pain with blood in Foley catheter (7/10, 7/21 -DC'd from ED) but returned the next day on 7/22 for the same.l                 Plan to return home with self care once stabnle.  Expected Discharge Plan: Home/Self Care Barriers to Discharge: Continued Medical Work up   Patient Goals and CMS Choice Patient states their goals for this hospitalization and ongoing recovery are:: to go home CMS Medicare.gov Compare Post Acute Care list provided to:: Patient    Expected Discharge Plan and Services Expected Discharge Plan: Home/Self Care   Discharge Planning Services: CM Consult   Living arrangements for the past 2 months: Single Family Home                                      Prior Living Arrangements/Services Living arrangements for the past 2 months: Single Family Home Lives with:: Spouse Patient language and need for interpreter reviewed:: Yes Do you feel safe going back to the place where you live?: Yes      Need for Family Participation in Patient Care: Yes (Comment) Care giver support system in place?: Yes (comment)   Criminal Activity/Legal  Involvement Pertinent to Current Situation/Hospitalization: No - Comment as needed  Activities of Daily Living      Permission Sought/Granted                  Emotional Assessment Appearance:: Appears stated age     Orientation: : Oriented to Self, Oriented to Place, Oriented to  Time, Oriented to Situation Alcohol / Substance Use: Not Applicable Psych Involvement: No (comment)  Admission diagnosis:  Hypokalemia [E87.6] Gross hematuria [R31.0] Non-sustained ventricular tachycardia (HCC) [I47.2] Urinary tract infection with hematuria, site unspecified [N39.0, R31.9] Hematoma of bladder wall, initial encounter [W09.81XB] Patient Active Problem List   Diagnosis Date Noted  . Non-sustained ventricular tachycardia ([place]) 08/26/2019  . Advanced care planning/counseling discussion 08/25/2019  . Hematuria 08/16/2019  . Allergic rhinitis 05/21/2019  . Weight loss 04/06/2019  . Seborrheic dermatitis 04/06/2019  . Edema 10/16/2018  . Stye 10/13/2018  . Ecchymosis of eye 10/13/2018  . T11 vertebral fracture ([place]) 02/13/2018  . L3 vertebral fracture ([place]) 02/13/2018  . Bilateral renal cysts 02/13/2018  . Spondylosis of cervical region without myelopathy or radiculopathy 09/25/2017  . Tear of left rotator cuff 09/25/2017  . Pharyngeal dysphagia 09/08/2017  . Generalized weakness 08/25/2017  . Right shoulder pain 08/25/2017  . Neurogenic bladder 07/21/2017  . Mild cognitive impairment 07/21/2017  . Neurogenic orthostatic hypotension ([place]) 04/21/2017  . Osteoarthritis 04/23/2016  . Gingivitis 04/15/2016  . History of arterial ischemic stroke 03/05/2016  . Aortic valve  stenosis, nonrheumatic   . TIA (transient ischemic attack) 01/10/2016  . Hx of CABG 01/10/2016  . Insomnia 03/23/2015  . Gout 02/09/2015  . Depression, recurrent ([place]) 02/02/2015  . RLS (restless legs syndrome) 02/02/2015  . Combined congestive systolic and diastolic heart failure ([place]) 02/02/2015  . Urinary  retention 12/30/2014  . Coronary artery disease involving native coronary artery of native heart without angina pectoris 12/30/2014  . Expressive aphasia 12/30/2014  . Chronic constipation   . Orthostatic hypotension   . Chronic knee pain 12/03/2014  . GERD (gastroesophageal reflux disease)   . Dyspnea on exertion 08/11/2014  . Hypothyroidism 08/11/2014  . Ventricular tachycardia ([place]) 07/27/2014  . Upper airway cough syndrome 07/14/2014  . SSS (sick sinus syndrome) ([place]) 01/31/2014  . Hypercholesterolemia 01/30/2014  . Cardiomyopathy, ischemic 08/25/2012  . Pacemaker 08/25/2012   PCP:  [name], MD Pharmacy:   [place], [place] - [place] [address] [place] [address] Phone: [phone] Fax: [phone]     Social Determinants of Health (SDOH) Interventions    Readmission Risk Interventions No flowsheet data found.

## 2019-08-26 NOTE — H&P (Addendum)
History and Physical        Hospital Admission Note Date: 08/26/2019  Patient name: Adam [name] Medical record number: [id number] Date of birth: 1926/09/06 Age: 86 y.o. Gender: male  PCP: [name], MD  Patient coming from: [facility]  Chief Complaint    No chief complaint on file.     HPI:   This is a very pleasant 86 year old male with past medical history of CAD s/p CABG in 1996 with extensive scarring of inferior wall MI, ischemic cardiomyopathy with chronic combined systolic and diastolic heart failure, sustained symptomatic VT on amiodarone, ischemic stroke, sinus node dysfunction with dual-chamber permanent pacemaker, severe orthostatic hypotension thought to be neurogenic and on midodrine, arthritis, hyperlipidemia, moderate AS, chronic urinary retention with indwelling Foley and previous TUR, previous hematuria and penile pain with multiple recent ED visits for penile and abdominal pain with blood in Foley catheter (7/10, 7/21 -DC'd from ED) but returned the next day on 7/22 for the same.  Per ED physician note:  4:45 informed patient of Vtach  Episode. This broke spontaneously and the patient had a pulse the entire time.   Immediately amiodarone was ordered and cardiology was consulted.  4:49 Case d/w Cardiology who recommend getting potassium to normal as this is the inciting event though patient also has known cardiomyopathy.  Agrees with amiodarone and admission to [facility], can stay at [facility] cardiology can consult at [facility]  5:29 am case d/w Dr. Matilde Sprang of urology.  CBI to continue and Dr. Matilde Sprang to consult regarding bladder hematoma   Today, I was paged multiple times by the patient's bedside nurse regarding patient's progressively worsening hypotension while on amiodarone drip.  He had not been getting his home midodrine and this was reordered and  amnio drip was initially held. SBP noted in 60s on right arm, repeat BP in left arm showed SBP 90s. Case was discussed with Dr. Margaretann Loveless, cardiology, who recommended stat echo, initially restarting amnio however after pacemaker interrogation it appeared his VT was actually paced and his amiodarone was discontinued.  He has a history of Pseudomonas and MRSA UTIs and was started on vancomycin and cefepime for concern of sepsis.  Patient is a poor historian having difficulty expressing his thoughts which is abnormal for him per his wife at bedside.  Vitals:   08/26/19 1000 08/26/19 1008  BP: (!) 74/43 (!) 62/35  Pulse: 70 70  Resp: 17 18  Temp:    SpO2: 100% 100%     Review of Systems:  Review of Systems  Reason unable to perform ROS: Difficult to obtain due to expressive aphasia.  Constitutional: Negative for chills and fever.  Respiratory: Negative for shortness of breath.   Cardiovascular: Negative for chest pain and palpitations.  Gastrointestinal: Positive for abdominal pain.  Genitourinary: Positive for dysuria and hematuria.  Neurological:       Speech changes  All other systems reviewed and are negative.   Medical/Social/Family History   Past Medical History: Past Medical History:  Diagnosis Date  . Arthritis    "minor, back and sometimes knees" (12/15/2012)  . Bradycardia    SSS s/p [employer] PPM 04/12/2008  . CAD (coronary artery disease)  12/30/2014   CABG (LIMA-LAD, SVG-RCA, SVG-OM in 1996).  07/2009 BMS to SVG-RCA. Cath in 04/2010 with patent stents   . Cardiomyopathy, ischemic 08/25/2012  . Chronic knee pain 12/03/2014  . Combined congestive systolic and diastolic heart failure ([place]) 02/02/2015   Hx EF 41%. BNP 96.8 02/21/15 Torsemide 04/06/15 Na 142, K 4.6, Bun 16, creat 0.89 04/20/14 BNP 111.7, Na 142, K 4.6, Bun 16, creat 0.9   . Depression with anxiety 02/02/2015   02/21/15 Hgb A1c 5.8 03/10/15 MMSE 30/30   . Dizziness, after diuretic asscoiated with hypotension and  responded to fluid bolus 06/05/2011   04/28/15 US carotid R+L normal bilateral arterial velocities.    Marland Kitchen Dyspnea 08/11/2014   Followed in Pulmonary clinic/ [facility]/ [place]  - 08/11/2014  Walked RA x 1 laps @ 185 ft each stopped due to fatigue/off balance/ slow pace/  no sob or desat  - PFT's  09/26/2014  FEV1 2.26 (85 % ) ratio 76  p no % improvement from saba with DLCO  67 % corrects to 93 % for alv volume      Since prev study 08/04/13 minimal change lung vol or dlco    . Embolic cerebral infarction ([place]) 12/06/2015  . GERD (gastroesophageal reflux disease)   . Gout 02/09/2015  . Hiatal hernia   . Hyperlipidemia   . Hypertension   . Hypothyroid   . Influenza A 03/10/2016  . Insomnia   . Melanoma of back ([place]) 1976  . Myocardial infarction [facility]) 1996; 2011   "both silent" (12/15/2012)  . Nonrheumatic aortic valve stenosis   . Orthostatic hypotension   . Osteoporosis, senile   . Pacemaker   . RBBB   . Restless leg 02/02/2015  . Right leg weakness 12/06/2015  . [facility] spotted fever   . S/P CABG x 4   . Sick sinus syndrome ([place]) 01/31/2014  . Sustained ventricular tachycardia ([place]) 07/27/2014  . Urinary retention 12/30/2014   has a cathrater    Past Surgical History:  Procedure Laterality Date  . CARDIAC CATHETERIZATION  04/2010   LIMA to LAD patent,SVG to OM patent,no in-stnet restenosis RCA  . CATARACT EXTRACTION W/ INTRAOCULAR LENS  IMPLANT, BILATERAL Bilateral 2012  . CORONARY ANGIOPLASTY WITH STENT PLACEMENT  07/2009   bare metal stent to SVG to the RCA  . CORONARY ARTERY BYPASS GRAFT  1996   LIMA to LAD,SVG to RCA & SVG to OM  . INSERT / REPLACE / REMOVE PACEMAKER  2010  . MELANOMA EXCISION  05/1974 X2   "taken off my back" (12/15/2012)  . NM MYOVIEW LTD  06/2011   low risk  . PPM GENERATOR CHANGEOUT N/A 01/22/2017   Procedure: PPM GENERATOR CHANGEOUT;  Surgeon: [name], MD;  Location: [place] [facility];  Service: Cardiovascular;  Laterality: N/A;  .  TONSILLECTOMY  1938  . TRANSURETHRAL RESECTION OF PROSTATE  1986  . US ECHOCARDIOGRAPHY  07/11/2009   EF 45-50%    Medications: Prior to Admission medications   Medication Sig Start Date End Date Taking? Authorizing Provider  acetaminophen (TYLENOL) 500 MG tablet Take 500 mg by mouth 3 (three) times daily.    Yes [provider]  Amino Acids-Protein Hydrolys (FEEDING SUPPLEMENT, PRO-STAT SUGAR FREE 64,) LIQD Take 30 mLs by mouth daily.    Yes [provider]  amiodarone (PACERONE) 100 MG tablet Take 1 tablet (100 mg total) by mouth daily. 01/16/17  Yes Croitoru, Mihai, MD  aspirin EC 81 MG tablet Take  1 tablet (81 mg total) by mouth daily. 01/12/16  Yes Reyne Dumas, MD  Cholecalciferol (VITAMIN D3) 5000 UNITS CAPS Take 5,000 Units by mouth every Monday.    Yes [provider]  clotrimazole-betamethasone (LOTRISONE) cream Apply 1 application topically daily as needed.   Yes [provider]  Droxidopa ([name]) 100 MG CAPS TAKE 1 CAPSULE BY MOUTH THREE TIMES DAILY. TAKE LAST DOSE AT LEAST 3 HOURS BEFORE BEDTIME. Patient taking differently: Take 100 mg by mouth in the morning, at noon, and at bedtime. TAKE LAST DOSE AT LEAST 3 HOURS BEFORE BEDTIME. 12/02/18  Yes Croitoru, Mihai, MD  DULoxetine (CYMBALTA) 20 MG capsule Take 20 mg by mouth daily. For anxiety,insomnia and back pain   Yes [provider]  fluticasone (FLONASE) 50 MCG/ACT nasal spray Place 1 spray into both nostrils 2 (two) times daily.   Yes [provider]  Glycerin (ORAJEL DRY MOUTH MT) Use as directed in the mouth or throat. Swish and spit 10 ml 4 x day as needed for sore gums. Continue until dentures fit and/or next dental visit. Four Times A Day - PRN   Yes [provider]  guaiFENesin (ROBITUSSIN) 100 MG/5ML SOLN Take 5 mLs (100 mg total) by mouth every 4 (four) hours as needed for cough or to loosen phlegm. 09/04/17  Yes Lavina Hamman, MD  ketoconazole (NIZORAL) 2 %  cream Apply 1 application topically as needed for irritation. Apply small amount behind ears and in creases beside nose BID x 7 days. MAY REPEAT PRN. (Family supplied).   Yes [provider]  levothyroxine (SYNTHROID, LEVOTHROID) 100 MCG tablet Take 100 mcg by mouth daily before breakfast.   Yes [provider]  meclizine (ANTIVERT) 25 MG tablet Take 1 tablet (25 mg total) by mouth 2 (two) times daily as needed for dizziness. 09/04/17  Yes Lavina Hamman, MD  Melatonin 3 MG TABS Take 3 mg by mouth at bedtime.   Yes [provider]  methocarbamol (ROBAXIN) 500 MG tablet Take 250 mg by mouth 2 (two) times daily as needed for muscle spasms.    Yes [provider]  midodrine (PROAMATINE) 5 MG tablet Take 5 mg by mouth 3 (three) times daily with meals. Notify provider if BP is < 90/60.   Yes [provider]  mineral oil-hydrophilic petrolatum (AQUAPHOR) ointment Apply 1 application topically 2 (two) times daily as needed for dry skin. To face   Yes [provider]  pantoprazole (PROTONIX) 40 MG tablet Take 40 mg by mouth daily.   Yes [provider]  Plecanatide (TRULANCE) 3 MG TABS Take 3 mg by mouth daily. 01/22/18  Yes Cirigliano, Vito V, DO  polyethylene glycol (MIRALAX / GLYCOLAX) 17 g packet Take 17 g by mouth daily as needed for mild constipation. Take 1-2 capfuls in 8 oz of H2O    Yes [provider]  pravastatin (PRAVACHOL) 40 MG tablet Take 1 tablet (40 mg total) by mouth at bedtime. 10/21/16  Yes Blanchie Serve, MD  rOPINIRole (REQUIP) 0.25 MG tablet Take 0.5 mg by mouth 2 (two) times daily.   Yes [provider]  rOPINIRole (REQUIP) 0.5 MG tablet Take 0.5 mg by mouth daily as needed.   Yes [provider]  sennosides-docusate sodium (SENOKOT-S) 8.6-50 MG tablet Take 2 tablets by mouth 2 (two) times daily.    Yes [provider]  torsemide (DEMADEX) 10 MG tablet Take 20 mg by mouth daily. 20 mg once a  day  Yes [provider]    Allergies:   Allergies  Allergen Reactions  . Altace [Ramipril] Cough  . Crestor [Rosuvastatin Calcium] Rash  . Penicillins Rash and Other (See Comments)    Has patient had a PCN reaction causing immediate rash, facial/tongue/throat swelling, SOB or lightheadedness with hypotension: Yes Has patient had a PCN reaction causing severe rash involving mucus membranes or skin necrosis: No Has patient had a PCN reaction that required hospitalization No Has patient had a PCN reaction occurring within the last 10 years: No If all of the above answers are "NO", then may proceed with Cephalosporin use.   . Sulfa Antibiotics Rash  . Sulfamethoxazole-Trimethoprim Rash    Social History:  reports that he has never smoked. He has never used smokeless tobacco. He reports that he does not drink alcohol and does not use drugs.  Family History: Family History  Problem Relation Age of Onset  . Coronary artery disease Mother   . Diabetes Mother   . Heart disease Mother   . Coronary artery disease Father   . Diabetes Father   . Lung cancer Father   . Heart disease Father   . Anuerysm Son   . Heart disease Brother   . Colon cancer Neg Hx   . Esophageal cancer Neg Hx      Objective   Physical Exam: Blood pressure (!) 62/35, pulse 70, temperature 98.2 F (36.8 C), temperature source Oral, resp. rate 18, height 5\' 10"  (1.778 m), weight 84.1 kg, SpO2 100 %.  Physical Exam Vitals and nursing note reviewed. Exam conducted with a chaperone present.  Constitutional:      Appearance: He is ill-appearing and diaphoretic.  HENT:     Head: Normocephalic.  Eyes:     Conjunctiva/sclera: Conjunctivae normal.  Cardiovascular:     Rate and Rhythm: Normal rate and regular rhythm.     Heart sounds: Murmur heard.      Comments: Paced on telemetry Pulmonary:     Effort: No respiratory distress.     Breath sounds: No wheezing.  Abdominal:     General: There is  no distension.     Tenderness: There is no abdominal tenderness.  Genitourinary:    Comments: Gross hematuria in Foley Musculoskeletal:        General: No swelling or tenderness.  Skin:    Coloration: Skin is not jaundiced.     Findings: No bruising.  Neurological:     Mental Status: He is alert. He is disoriented.     Comments: Stuttering and unable to get his thoughts out      LABS on Admission: I have personally reviewed all the labs and imaging below    Basic Metabolic Panel: Recent Labs  Lab 08/25/19 0001 08/25/19 0001 08/26/19 0127 08/26/19 0435 08/26/19 0801  NA 140   < > 139  --  139  K 2.9*   < > 2.9*  --  3.5  CL 99   < > 99  --  102  CO2 26  --   --   --  16*  GLUCOSE 125*   < > 198*  --  204*  BUN 15   < > 19  --  23  CREATININE 0.87   < > 1.10  --  1.53*  CALCIUM 9.0  --   --   --  8.6*  MG  --   --   --  3.1*  --   PHOS  --   --   --  4.8*  --    < > = values in this interval not displayed.   Liver Function Tests: No results for input(s): AST, ALT, ALKPHOS, BILITOT, PROT, ALBUMIN in the last 168 hours. No results for input(s): LIPASE, AMYLASE in the last 168 hours. No results for input(s): AMMONIA in the last 168 hours. CBC: Recent Labs  Lab 08/25/19 0001 08/25/19 0001 08/26/19 0111 08/26/19 0127  WBC 12.1*  --  17.2*  --   NEUTROABS  --   --  13.2*  --   HGB 13.0   < > 12.4* 12.9*  HCT 38.9*   < > 37.2* 38.0*  MCV 98.5   < > 99.2  --   PLT 258  --  303  --    < > = values in this interval not displayed.   Cardiac Enzymes: No results for input(s): CKTOTAL, CKMB, CKMBINDEX, TROPONINI in the last 168 hours. BNP: Invalid input(s): POCBNP CBG: No results for input(s): GLUCAP in the last 168 hours.  Radiological Exams on Admission:  DG Chest Portable 1 View  Result Date: 08/26/2019 CLINICAL DATA:  History of pain. EXAM: PORTABLE CHEST 1 VIEW COMPARISON:  CT abdomen 01/18/2018.  Chest x-ray 01/18/2018. FINDINGS: Cardiac pacer in stable  position. Prior CABG. Stable cardiomegaly. No pulmonary venous congestion. Large hiatal hernia again noted. No focal infiltrate noted. No pleural effusion or pneumothorax. IMPRESSION: 1. Cardiac pacer stable position. Prior CABG. Stable cardiomegaly. No acute abnormality identified. Chest is stable from prior exam. 2.  Large hiatal hernia again noted. Electronically Signed   By: Marcello Moores  Register   On: 08/26/2019 06:18   CT Renal Stone Study  Result Date: 08/26/2019 CLINICAL DATA:  Hematuria.  Lower abdominal pain. EXAM: CT ABDOMEN AND PELVIS WITHOUT CONTRAST TECHNIQUE: Multidetector CT imaging of the abdomen and pelvis was performed following the standard protocol without IV contrast. COMPARISON:  01/18/2018 FINDINGS: Lower chest: A large hiatal hernia is again partially visualized with the majority of the stomach being located in the chest. There is associated compressive atelectasis in the left lower lobe. Trace left pleural effusion, smaller than on the prior study. Partially visualized pacemaker leads. Hepatobiliary: Subcentimeter hypodensity in the left hepatic lobe, too small to fully characterize. Unremarkable gallbladder. No biliary dilatation. Pancreas: Unremarkable. Spleen: Unremarkable. Adrenals/Urinary Tract: Unremarkable right adrenal gland. Unchanged slight left adrenal nodularity. Left renal calculi measuring up to 5 mm in the lower pole. Vascular calcifications in the right renal hilum. Left renal cysts including an unchanged 5.1 cm cyst in the lower pole. Mild bilateral hydronephrosis and right hydroureter. Moderate bladder distension. 11 cm hyperdense mass dependently in the bladder with a small amount of gas likely reflecting a large hematoma. Foley catheter in place. Stomach/Bowel: There is no evidence of bowel obstruction or inflammation. 1 cm metallic density at the base of the cecum. Vascular/Lymphatic: Abdominal aortic atherosclerosis. Unchanged focal saccular dilatation of the infrarenal  abdominal aorta with transverse diameter of 2.5 cm. No enlarged lymph nodes. Reproductive: Unremarkable prostate. Other: Mild nonspecific stranding adjacent to the bladder and in the presacral region. No fluid collection or pneumoperitoneum. Musculoskeletal: Chronic T11 compression fracture with progressive severe vertebral body height loss. Unchanged chronic L3 compression fracture with mild height loss. IMPRESSION: 1. Large bladder hematoma with Foley catheter in place. Stranding about the bladder could indicate hemorrhagic cystitis. An underlying mass is not excluded. Consider urology consultation. 2. Mild bilateral hydronephrosis. 3. Nonobstructing left nephrolithiasis. 4. Large hiatal hernia. 5. Aortic Atherosclerosis (ICD10-I70.0). Electronically Signed   By:  Logan Bores M.D.   On: 08/26/2019 04:38      EKG: Independently reviewed.  Paced rhythm   A & P   Active Problems:   Non-sustained ventricular tachycardia ([place])   1. Recurrent hematuria with bladder hematoma and Chronic indwelling Foley for chronic urinary retention a. CT renal stone study: Large bladder hematoma with possible hemorrhagic cystitis underlying mass not excluded b. Urology on board: not a surgical candidate, continue CBI  2. Tachycardia with history of VT and dual-chamber pacemaker a. Was given amiodarone bolus overnight with amio drip for concern of VT b. Per cardiology and device interrogation, patient did not experience VT but instead was sinus tachycardia with V pacing response and pacemaker mediated tachycardia c. Change back to home p.o. amiodarone d. Goal K> 4.0, goal Mg > 2.0  3. Hypotension with history of chronic hypotension thought to be neurogenic a. Received several IV fluid boluses b. BP discrepancy between bilateral upper extremities, SBP in RUE 60s, SBP LUE 90s c. Restart home midodrine d. Stat echo to rule out worsening valvular disease  4. SIRS concern for developing sepsis a. Leukocytosis  (17.2) with tachypnea and hypotension as above  b. Has a history of Pseudomonas and MRSA UTIs, concern for possible urinary source/bacterial translocation c. will start on vancomycin and cefepime as empiric coverage d. Blood cultures x2 e. Monitor volume status after getting fluids f. Urine culture  5. Acute encephalopathy, suspect metabolic, AKI and hypotension induced a. CT brain b. Correct electrolyte abnormalities c. Correct hypotension d. SLP eval  6. AKI a. Likely from hemodynamic changes and urinary retention b. Correct underlying issues above  7. CAD s/p CABG a. Only elevated troponin unlikely ACS b. Aspirin on hold due to hematuria c. Continue statin d. Cardiology on board  8. Chronic combined systolic and diastolic heart failure a. Bilateral lower extremity edema on exam b. Torsemide on hold due to hypotension and AKI, resume when BP and creatinine improves c. I/O and daily weight  9. Moderate AS a. Follow-up echo  10. Diabetes a. HA1C b. Sensitive sliding scale   DVT prophylaxis: SCDs   Code Status: Partial Code  Diet: Heart healthy carb controlled Family Communication: Admission, patients condition and plan of care including tests being ordered have been discussed with the patient who indicates understanding and agrees with the plan and Code Status. Patient's wife and daughter was updated  Disposition Plan: The appropriate patient status for this patient is INPATIENT. Inpatient status is judged to be reasonable and necessary in order to provide the required intensity of service to ensure the patient's safety. The patient's presenting symptoms, physical exam findings, and initial radiographic and laboratory data in the context of their chronic comorbidities is felt to place them at high risk for further clinical deterioration. Furthermore, it is not anticipated that the patient will be medically stable for discharge from the hospital within 2 midnights of admission.  The following factors support the patient status of inpatient.   " The patient's presenting symptoms include hematuria. " The worrisome physical exam findings include encephalopathy, hematuria, hypotension. " The initial radiographic and laboratory data are worrisome because of leukocytosis and tachycardia. " The chronic co-morbidities include CAD s/p CABG, diabetes, aortic stenosis, combined heart failure, indwelling Foley and hematuria.   * I certify that at the point of admission it is my clinical judgment that the patient will require inpatient hospital care spanning beyond 2 midnights from the point of admission due to high intensity of service, high  risk for further deterioration and high frequency of surveillance required.*   Status is: Inpatient  Remains inpatient appropriate because:Hemodynamically unstable, Persistent severe electrolyte disturbances, IV treatments appropriate due to intensity of illness or inability to take PO and Inpatient level of care appropriate due to severity of illness   Dispo: The patient is from: SNF              Anticipated d/c is to: TBD              Anticipated d/c date is: > 3 days              Patient currently is not medically stable to d/c.        The medical decision making on this patient was of high complexity and the patient is at high risk for clinical deterioration, therefore this is a level 3  admission.  Consultants  . Urology . Cardiology  Procedures  . None  Time Spent on Admission: 90 minutes    Harold Hedge, DO [facility] Pager [phone] 08/26/2019, 1:01 PM

## 2019-08-27 DIAGNOSIS — R6510 Systemic inflammatory response syndrome (SIRS) of non-infectious origin without acute organ dysfunction: Secondary | ICD-10-CM | POA: Diagnosis present

## 2019-08-27 DIAGNOSIS — I472 Ventricular tachycardia: Secondary | ICD-10-CM | POA: Diagnosis not present

## 2019-08-27 DIAGNOSIS — I255 Ischemic cardiomyopathy: Secondary | ICD-10-CM

## 2019-08-27 DIAGNOSIS — I959 Hypotension, unspecified: Secondary | ICD-10-CM | POA: Diagnosis present

## 2019-08-27 LAB — COMPREHENSIVE METABOLIC PANEL
ALT: 12 U/L (ref 0–44)
AST: 20 U/L (ref 15–41)
Albumin: 2.7 g/dL — ABNORMAL LOW (ref 3.5–5.0)
Alkaline Phosphatase: 53 U/L (ref 38–126)
Anion gap: 13 (ref 5–15)
BUN: 25 mg/dL — ABNORMAL HIGH (ref 8–23)
CO2: 23 mmol/L (ref 22–32)
Calcium: 8.5 mg/dL — ABNORMAL LOW (ref 8.9–10.3)
Chloride: 103 mmol/L (ref 98–111)
Creatinine, Ser: 0.96 mg/dL (ref 0.61–1.24)
GFR calc Af Amer: >60 mL/min (ref >60)
GFR calc non Af Amer: >60 mL/min (ref >60)
Glucose, Bld: 133 mg/dL — ABNORMAL HIGH (ref 70–99)
Potassium: 5 mmol/L (ref 3.5–5.1)
Sodium: 139 mmol/L (ref 135–145)
Total Bilirubin: 0.8 mg/dL (ref 0.3–1.2)
Total Protein: 5.7 g/dL — ABNORMAL LOW (ref 6.5–8.1)

## 2019-08-27 LAB — CBC
HCT: 24.9 % — ABNORMAL LOW (ref 39.0–52.0)
Hemoglobin: 8 g/dL — ABNORMAL LOW (ref 13.0–17.0)
MCH: 32.5 pg (ref 26.0–34.0)
MCHC: 32.1 g/dL (ref 30.0–36.0)
MCV: 101.2 fL — ABNORMAL HIGH (ref 80.0–100.0)
Platelets: 247 10*3/uL (ref 150–400)
RBC: 2.46 MIL/uL — ABNORMAL LOW (ref 4.22–5.81)
RDW: 15.1 % (ref 11.5–15.5)
WBC: 27.5 10*3/uL — ABNORMAL HIGH (ref 4.0–10.5)
nRBC: 0 % (ref 0.0–0.2)

## 2019-08-27 LAB — GLUCOSE, CAPILLARY
Glucose-Capillary: 126 mg/dL — ABNORMAL HIGH (ref 70–99)
Glucose-Capillary: 123 mg/dL — ABNORMAL HIGH (ref 70–99)
Glucose-Capillary: 118 mg/dL — ABNORMAL HIGH (ref 70–99)
Glucose-Capillary: 135 mg/dL — ABNORMAL HIGH (ref 70–99)

## 2019-08-27 LAB — URINE CULTURE

## 2019-08-27 LAB — MAGNESIUM: Magnesium: 2.6 mg/dL — ABNORMAL HIGH (ref 1.7–2.4)

## 2019-08-27 LAB — ABO/RH: ABO/RH(D): A POS

## 2019-08-27 LAB — PREPARE RBC (CROSSMATCH)

## 2019-08-27 LAB — LACTIC ACID, PLASMA
Lactic Acid, Venous: 2.1 mmol/L (ref 0.5–1.9)
Lactic Acid, Venous: 4.1 mmol/L (ref 0.5–1.9)

## 2019-08-27 LAB — HEMOGLOBIN AND HEMATOCRIT, BLOOD
HCT: 24.8 % — ABNORMAL LOW (ref 39.0–52.0)
Hemoglobin: 7.9 g/dL — ABNORMAL LOW (ref 13.0–17.0)

## 2019-08-27 MED ORDER — VANCOMYCIN HCL 1500 MG/300ML IV SOLN
1500.0000 mg | INTRAVENOUS | Status: DC
  Administered 2019-08-28 – 2019-08-30 (×3): 1500 mg via INTRAVENOUS
  Filled 2019-08-27 (×4): qty 300

## 2019-08-27 MED ORDER — SODIUM CHLORIDE 0.9 % IV BOLUS
250.0000 mL | Freq: Once | INTRAVENOUS | Status: AC
  Administered 2019-08-27: 250 mL via INTRAVENOUS

## 2019-08-27 MED ORDER — SODIUM CHLORIDE 0.9% IV SOLUTION: Freq: Once | INTRAVENOUS | Status: AC

## 2019-08-27 MED ORDER — RISAQUAD PO CAPS
1.0000 | ORAL_CAPSULE | Freq: Three times a day (TID) | ORAL | Status: DC
  Administered 2019-08-27 – 2019-09-03 (×22): 1 via ORAL
  Filled 2019-08-27 (×22): qty 1

## 2019-08-27 NOTE — Progress Notes (Signed)
SLP Cancellation Note  Patient Details Name: Adam [name] MRN: [id number] DOB: Aug 07, 1926   Cancelled treatment:       Reason Eval/Treat Not Completed: Other (comment);Patient declined, no reason specified (pt and family declined to have swallow evaluation, wife and daughter state pt is doing well and deny pt having dysphagia)  Pt is aware he has a hiatal hernia per yes/no questioning.  Please reorder if pt/family desire.  Kathleen Lime, MS [facility] SLP Acute Rehab Services Office [phone]   Macario Golds 08/27/2019, 12:49 PM

## 2019-08-27 NOTE — Progress Notes (Signed)
Pharmacy Antibiotic Note  Adam [name] is a 86 y.o. male admitted on 08/25/2019 with hematuria and sepsis.  Pharmacy has been consulted for cefepime and vancomycin dosing. Hx MRSA and PsA UTIs.  Plan:  Continue Cefepime 2g IV q12 hr  Increase vancomycin to 1500 mg IV q24 hr with improved renal function   Follow renal function closely and adjust dosing as indicated  F/U cultures   Height: 5\' 10"  (177.8 cm) Weight: 84.1 kg (185 lb 6.5 oz) IBW/kg (Calculated) : 73  Temp (24hrs), Avg:98.2 F (36.8 C), Min:97.9 F (36.6 C), Max:98.8 F (37.1 C)  Recent Labs  Lab 08/25/19 0001 08/26/19 0111 08/26/19 0127 08/26/19 0801 08/27/19 0226 08/27/19 0752 08/27/19 1106  WBC 12.1* 17.2*  --   --  27.5*  --   --   CREATININE 0.87  --  1.10 1.53* 0.96  --   --   LATICACIDVEN  --   --   --   --   --  2.1* 4.1*    Estimated Creatinine Clearance: 52.8 mL/min (by C-G formula based on SCr of 0.96 mg/dL).    Allergies  Allergen Reactions  . Altace [Ramipril] Cough  . Crestor [Rosuvastatin Calcium] Rash  . Penicillins Rash and Other (See Comments)    Has patient had a PCN reaction causing immediate rash, facial/tongue/throat swelling, SOB or lightheadedness with hypotension: Yes Has patient had a PCN reaction causing severe rash involving mucus membranes or skin necrosis: No Has patient had a PCN reaction that required hospitalization No Has patient had a PCN reaction occurring within the last 10 years: No If all of the above answers are "NO", then may proceed with Cephalosporin use.   . Sulfa Antibiotics Rash  . Sulfamethoxazole-Trimethoprim Rash    Antimicrobials this admission: 7/22 ceftriaxone x 1 7/22 vancomycin >> 7/22 cefepime >>   Dose adjustments this admission: 7/23 Incr vanc to 1500 q24 with improved SCr  Microbiology results: 7/22 BCx: ngtd 7/22 UCx: mx spp. 7/22 Covid: neg 7/22 MRSA PCR: POS  Thank you for allowing pharmacy to be a part of this patient's  care.  [name] A 08/27/2019 3:07 PM

## 2019-08-27 NOTE — Progress Notes (Addendum)
PROGRESS NOTE    Patient: Adam [name]                            PCP: [name], MD                    DOB: 20-Apr-1926            DOA: 08/25/2019 QZE:[id number]             DOS: 08/27/2019, 11:09 AM   LOS: 1 day   Date of Service: The patient was seen and examined on 08/27/2019  Subjective:   The patient was seen and examined this morning, awake alert oriented no acute distress denies of having any shortness of breath or chest pain. Complaining of generalized weaknesses, dizziness Remains mildly hypotensive.   Noted for H&H drop from 12.9 >> 8.0 Discussed with patient and family at bedside in case needing blood transfusion. Pros and cons of blood transfusion discussed, agreeable to transfusion if needed   Daughter and wife present at bedside.... Finding plan of care was discussed in detail.  Brief Narrative:   Adam [name] is a  86 year old male with past medical history of CAD s/p CABG in 1996 with extensive scarring of inferior wall MI, ischemic cardiomyopathy with chronic combined systolic and diastolic heart failure, sustained symptomatic VT on amiodarone, ischemic stroke, sinus node dysfunction with dual-chamber permanent pacemaker, severe orthostatic hypotension thought to be neurogenic and on midodrine, arthritis, hyperlipidemia, moderate AS, chronic urinary retention with indwelling Foley and previous TUR, previous hematuria and penile pain with multiple recent ED visits for penile and abdominal pain with blood in Foley catheter (7/10, 7/21 -DC'd from ED) but returned the next day on 7/22 for the same.  -In ED had an episode of V. tach episode- was placed on amiodarone drip in ED, developed hypotension, pacemaker was interrogated-amiodarone was DC'd - MacDiarmid to consult regarding bladder hematoma  - history of Pseudomonas and MRSA UTIs and was started on vancomycin and cefepime for concern of sepsis.    Assessment & Plan:   Active Problems:   Non-sustained  ventricular tachycardia (HCC)  1. Recurrent hematuria with bladder hematoma and Chronic indwelling Foley for chronic urinary retention a. CT renal stone study: Large bladder hematoma with possible hemorrhagic cystitis underlying mass not excluded b. Urology on board: not a surgical candidate, continue CBI c. Hemoglobin dropped 12.9 >>8.0 --- monitoring closely Discussed with patient and family regarding possibility of blood transfusion pros and cons of blood transfusion were discussed in detail, they agreed if needed would agree with blood transfusion. d. Mildly hypotensive, otherwise stable, blood pressure currently 118/44 e. Urology Dr.MacDiarmid following   2. Acute blood loss anemia due to hematuria  Hemoglobin dropped from 12.9  >>> 8.0  Will continue monitoring H&H  Since patient is symptomatic, orthostatic hypotension, hypertension, dizziness, from the  cardiac  standpoint  Will proceed with transfusing 1 unit of PRBC -slow transfusion  Patient and family his consented to the blood transfusion, pros and cons were discussed in  detail, agreed to pursue  3. Tachycardia with history of VT and dual-chamber pacemaker a. Stable this morning b. Was given amiodarone bolus overnight with amio drip for concern of VT c. Per cardiology and device interrogation, patient did not experience VT but instead was sinus tachycardia with V pacing response and pacemaker mediated tachycardia d. Change back to home p.o. amiodarone e. Goal K> 4.0, goal Mg >  2.0  3. Hypotension with history of chronic hypotension thought to be neurogenic a. Received several IV fluid boluses  b. Per nursing staff orthostatic hypotension self now, blood pressure 102/58, improved to 118/44 c. Another bolus IV fluid 500 mL normal saline will be given d. BP discrepancy between bilateral upper extremities, SBP in RUE 60s, SBP LUE 90s e. We will continue midodrine f. Echo:   4. SIRS concern for sepsis a. Leukocytosis (17.2  >>27.5) with tachypnea and hypotension as above  b. Lactic acid 2.1 c. Hemodynamically stable, mental status at baseline d. Has a history of Pseudomonas and MRSA UTIs, concern for possible urinary source/bacterial translocation e. We will continue vancomycin and cefepime as empiric coverage f. Blood cultures x2 >> g. Urine culture >> h. Limited fluid resuscitation-monitor volume status -patient has a history of ALS, combined s/dCHF   5. Acute encephalopathy, suspect metabolic, AKI and hypotension induced a. CT brain --reviewed, within normal limits b. Correct electrolyte abnormalities c. Correct hypotension d. SLP eval  6. AKI a. Likely from hemodynamic changes and urinary retention b. Correct underlying issues above c. Creatinine 1.53 >> 0.96  7. CAD s/p CABG a. Only elevated troponin unlikely ACS b. Aspirin on hold due to hematuria c. Continue statin d. Cardiology on board  8. Chronic combined systolic and diastolic heart failure a. Bilateral lower extremity edema on exam b. Torsemide on hold due to hypotension and AKI, resume when BP and creatinine improves c. I/O and daily weight d. Will be mindful with fluid resuscitation   9. Moderate AS a. Follow-up echo --pending b. Remain asymptomatic  10. Diabetes a. HA1C b. Sensitive sliding scale   Nutritional status:         Cultures; Blood Cultures x 2 >> NGT Urine Culture  >>> (history of Pseudomonas/MRSA ) >>>    Antimicrobials: 08/26/2019 >> IV antibiotics   Consultants: Urology/cardiology   ------------------------------------------------------------------------------------------------------------------------------------------  DVT prophylaxis:  SCD/Compression stockings Code Status:   Code Status: Partial Code Family Communication: Wife and daughter were present at bedside  The above findings and plan of care has been discussed with patient (and family )  in detail,  they expressed  understanding and agreement of above. -Advance care planning has been discussed.   Admission status:    Status is: Inpatient  Remains inpatient appropriate because:Inpatient level of care appropriate due to severity of illness   Dispo: The patient is from: Home              Anticipated d/c is to: Home              Anticipated d/c date is: > 3 days              Patient currently is not medically stable to d/c.  Hemodynamically unstable continue needing intervention, IV fluids, bladder irrigation, IV antibiotics        Procedures:   -Foley catheter-with continuous bladder irrigation  Antimicrobials:  Anti-infectives (From admission, onward)   Start     Dose/Rate Route Frequency Ordered Stop   08/27/19 1300  vancomycin (VANCOCIN) IVPB 1000 mg/200 mL premix     Discontinue     1,000 mg 200 mL/hr over 60 Minutes Intravenous Every 24 hours 08/26/19 1055     08/27/19 0600  cefTRIAXone (ROCEPHIN) 1 g in sodium chloride 0.9 % 100 mL IVPB  Status:  Discontinued        1 g 200 mL/hr over 30 Minutes Intravenous Every 24 hours 08/26/19 1012 08/26/19 1027  08/26/19 2300  ceFEPIme (MAXIPIME) 2 g in sodium chloride 0.9 % 100 mL IVPB     Discontinue     2 g 200 mL/hr over 30 Minutes Intravenous Every 12 hours 08/26/19 1055     08/26/19 1130  vancomycin (VANCOREADY) IVPB 1750 mg/350 mL        1,750 mg 175 mL/hr over 120 Minutes Intravenous  Once 08/26/19 1055 08/26/19 1629   08/26/19 1130  ceFEPIme (MAXIPIME) 2 g in sodium chloride 0.9 % 100 mL IVPB        2 g 200 mL/hr over 30 Minutes Intravenous  Once 08/26/19 1055 08/26/19 1318   08/26/19 0500  cefTRIAXone (ROCEPHIN) 1 g in sodium chloride 0.9 % 100 mL IVPB        1 g 200 mL/hr over 30 Minutes Intravenous  Once 08/26/19 0454 08/26/19 0535       Medication:  . acidophilus  1 capsule Oral TID  . amiodarone  100 mg Oral Daily  . Chlorhexidine Gluconate Cloth  6 each Topical Daily  . Chlorhexidine Gluconate Cloth  6 each Topical  Q0600  . Droxidopa  100 mg Oral TID WC  . feeding supplement (ENSURE ENLIVE)  237 mL Oral BID BM  . insulin aspart  0-9 Units Subcutaneous TID WC  . levothyroxine  100 mcg Oral QAC breakfast  . mouth rinse  15 mL Mouth Rinse BID  . midodrine  5 mg Oral TID WC  . mupirocin ointment  1 application Nasal BID  . nystatin  5 mL Oral QID  . oxybutynin  10 mg Oral QHS  . Plecanatide  3 mg Oral Daily  . pravastatin  40 mg Oral QHS  . rOPINIRole  0.5 mg Oral BID  . sodium chloride flush  3 mL Intravenous Q12H    acetaminophen **OR** acetaminophen, methocarbamol, polyethylene glycol, prochlorperazine, rOPINIRole   Objective:   Vitals:   08/27/19 0600 08/27/19 0700 08/27/19 0800 08/27/19 0900  BP: (!) 102/56 (!) 102/58 (!) 118/55 (!) 118/44  Pulse: 71 69 70 71  Resp: 16 19 17 15   Temp:   98.8 F (37.1 C)   TempSrc:   Oral   SpO2: 100% 100% 100% 100%  Weight:      Height:        Intake/Output Summary (Last 24 hours) at 08/27/2019 1109 Last data filed at 08/27/2019 2355 Gross per 24 hour  Intake 27555.81 ml  Output 33500 ml  Net -5944.19 ml   Filed Weights   08/26/19 0615 08/27/19 0452  Weight: 84.1 kg 84.1 kg     Examination:   Physical Exam  Constitution:  Alert, cooperative, no distress,  Appears calm and comfortable   HEENT: Normocephalic, PERRL, otherwise with in Normal limits  Chest:Chest symmetric Cardio vascular:  S1/S2, RRR, No murmure, No Rubs or Gallops  pulmonary: Clear to auscultation bilaterally, respirations unlabored, negative wheezes / crackles Abdomen: Soft, non-tender, non-distended, bowel sounds,no masses, no organomegaly Muscular skeletal: Limited exam - in bed, able to move all 4 extremities, Normal strength,  Neuro: CNII-XII intact. , normal motor and sensation, reflexes intact  Extremities: No pitting edema lower extremities, +2 pulses  Skin: Dry, warm to touch, negative for any Rashes, No open wounds Wounds: per nursing documentation Foley  catheter in place    ------------------------------------------------------------------------------------------------------------------------------------------    LABs:  CBC Latest Ref Rng & Units 08/27/2019 08/26/2019 08/26/2019  WBC 4.0 - 10.5 K/uL 27.5(H) - 17.2(H)  Hemoglobin 13.0 - 17.0 g/dL 8.0(L) 12.9(L) 12.4(L)  Hematocrit 39 - 52 % 24.9(L) 38.0(L) 37.2(L)  Platelets 150 - 400 K/uL 247 - 303   CMP Latest Ref Rng & Units 08/27/2019 08/26/2019 08/26/2019  Glucose 70 - 99 mg/dL 133(H) 204(H) 198(H)  BUN 8 - 23 mg/dL 25(H) 23 19  Creatinine 0.61 - 1.24 mg/dL 0.96 1.53(H) 1.10  Sodium 135 - 145 mmol/L 139 139 139  Potassium 3.5 - 5.1 mmol/L 5.0 3.5 2.9(L)  Chloride 98 - 111 mmol/L 103 102 99  CO2 22 - 32 mmol/L 23 16(L) -  Calcium 8.9 - 10.3 mg/dL 8.5(L) 8.6(L) -  Total Protein 6.5 - 8.1 g/dL 5.7(L) - -  Total Bilirubin 0.3 - 1.2 mg/dL 0.8 - -  Alkaline Phos 38 - 126 U/L 53 - -  AST 15 - 41 U/L 20 - -  ALT 0 - 44 U/L 12 - -       Micro Results Recent Results (from the past 240 hour(s))  SARS Coronavirus 2 by RT PCR (hospital order, performed in [facility] hospital lab) Nasopharyngeal Nasopharyngeal Swab     Status: None   Collection Time: 08/26/19  3:12 AM   Specimen: Nasopharyngeal Swab  Result Value Ref Range Status   SARS Coronavirus 2 NEGATIVE NEGATIVE Final    Comment: (NOTE) SARS-CoV-2 target nucleic acids are NOT DETECTED.  The SARS-CoV-2 RNA is generally detectable in upper and lower respiratory specimens during the acute phase of infection. The lowest concentration of SARS-CoV-2 viral copies this assay can detect is 250 copies / mL. A negative result does not preclude SARS-CoV-2 infection and should not be used as the sole basis for treatment or other patient management decisions.  A negative result may occur with improper specimen collection / handling, submission of specimen other than nasopharyngeal swab, presence of viral mutation(s) within the areas  targeted by this assay, and inadequate number of viral copies (<250 copies / mL). A negative result must be combined with clinical observations, patient history, and epidemiological information.  Fact Sheet for Patients:   StrictlyIdeas.no  Fact Sheet for Healthcare Providers: BankingDealers.co.za  This test is not yet approved or  cleared by the [place] FDA and has been authorized for detection and/or diagnosis of SARS-CoV-2 by FDA under an Emergency Use Authorization (EUA).  This EUA will remain in effect (meaning this test can be used) for the duration of the COVID-19 declaration under Section 564(b)(1) of the Act, 21 U.S.C. section 360bbb-3(b)(1), unless the authorization is terminated or revoked sooner.  Performed at [facility], [place] [address], [place], [place] [address]   MRSA PCR Screening     Status: Abnormal   Collection Time: 08/26/19  6:45 AM   Specimen: Nasopharyngeal  Result Value Ref Range Status   MRSA by PCR POSITIVE (A) NEGATIVE Final    Comment:        The GeneXpert MRSA Assay (FDA approved for NASAL specimens only), is one component of a comprehensive MRSA colonization surveillance program. It is not intended to diagnose MRSA infection nor to guide or monitor treatment for MRSA infections. RESULT CALLED TO, READ BACK BY AND VERIFIED WITH: CARTER,K @ 8338 SN [id number] BY POTEAT,S Performed at [facility], [place] [address] [place], [place] [address]   Culture, blood (routine x 2)     Status: None (Preliminary result)   Collection Time: 08/26/19 10:58 AM   Specimen: BLOOD RIGHT HAND  Result Value Ref Range Status   Specimen Description   Final    BLOOD  RIGHT HAND Performed at [facility], [place] [address], [place], [place] [address]    Special Requests   Final    BOTTLES DRAWN AEROBIC ONLY Blood Culture results may not be optimal due to an  inadequate volume of blood received in culture bottles Performed at [facility] [address], [place], [place] [address]    Culture   Final    NO GROWTH < 24 HOURS Performed at [facility] [address], [place], [place] [address]    Report Status PENDING  Incomplete  Culture, blood (routine x 2)     Status: None (Preliminary result)   Collection Time: 08/26/19 10:58 AM   Specimen: BLOOD LEFT HAND  Result Value Ref Range Status   Specimen Description   Final    BLOOD LEFT HAND Performed at [facility] [address] [place], [place] [address]    Special Requests   Final    BOTTLES DRAWN AEROBIC AND ANAEROBIC Blood Culture adequate volume Performed at [facility] [address] [place], [place] [address]    Culture   Final    NO GROWTH < 24 HOURS Performed at [facility] [address] [place], [place] [address]    Report Status PENDING  Incomplete    Radiology Reports CT HEAD WO CONTRAST  Result Date: 08/26/2019 CLINICAL DATA:  Altered mental status. EXAM: CT HEAD WITHOUT CONTRAST TECHNIQUE: Contiguous axial images were obtained from the base of the skull through the vertex without intravenous contrast. COMPARISON:  January 11, 2016. FINDINGS: Brain: Mild diffuse cortical atrophy is noted. Minimal chronic ischemic white matter disease is noted. No mass effect or midline shift is noted. Ventricular size is within normal limits. There is no evidence of mass lesion, hemorrhage or acute infarction. Vascular: No hyperdense vessel or unexpected calcification. Skull: Normal. Negative for fracture or focal lesion. Sinuses/Orbits: No acute finding. Other: None. IMPRESSION: Mild diffuse cortical atrophy. Minimal chronic ischemic white matter disease. No acute intracranial abnormality seen. Electronically Signed   By: Marijo Conception M.D.   On: 08/26/2019 14:50   DG Chest Portable 1 View  Result Date:  08/26/2019 CLINICAL DATA:  History of pain. EXAM: PORTABLE CHEST 1 VIEW COMPARISON:  CT abdomen 01/18/2018.  Chest x-ray 01/18/2018. FINDINGS: Cardiac pacer in stable position. Prior CABG. Stable cardiomegaly. No pulmonary venous congestion. Large hiatal hernia again noted. No focal infiltrate noted. No pleural effusion or pneumothorax. IMPRESSION: 1. Cardiac pacer stable position. Prior CABG. Stable cardiomegaly. No acute abnormality identified. Chest is stable from prior exam. 2.  Large hiatal hernia again noted. Electronically Signed   By: Marcello Moores  Register   On: 08/26/2019 06:18   ECHOCARDIOGRAM COMPLETE  Result Date: 08/26/2019    ECHOCARDIOGRAM REPORT   Patient Name:   URIYAH RASKA Date of Exam: 08/26/2019 Medical Rec #:  [id number]     Height:       70.0 in Accession #:    [id number]    Weight:       185.4 lb Date of Birth:  07/12/1926     BSA:          2.021 m Patient Age:    58 years      BP:           105/85 mmHg Patient Gender: M             HR:  70 bpm. Exam Location:  Inpatient Procedure: 2D Echo, Cardiac Doppler, Color Doppler and Intracardiac            Opacification Agent                     STAT ECHO Reported to: Dr Gardiner Rhyme on 08/26/2019 12:18:00 PM. Indications:    R94.31 Abnormal EKG  History:        Patient has prior history of Echocardiogram examinations, most                 recent 09/02/2017. CHF and Cardiomyopathy, Previous Myocardial                 Infarction and CAD, Pacemaker and Prior CABG, Stroke, Aortic                 Valve Disease, Arrythmias:RBBB, Signs/Symptoms:Dyspnea; Risk                 Factors:Hypertension, Dyslipidemia and GERD. Sick Sinus                 Syndrome.  Sonographer:    Tiffany Dance Referring Phys: [id number] Harold Hedge  Sonographer Comments: No subcostal window. IMPRESSIONS  1. Left ventricular ejection fraction, by estimation, is 40 to 45%. The left ventricle has mildly decreased function. The left ventricle demonstrates regional wall motion  abnormalities (see scoring diagram/findings for description). Basal to mid inferior/inferolateral akinesis. There is severe asymmetric left ventricular hypertrophy of the basal-septal segment. Left ventricular diastolic parameters are indeterminate.  2. Right ventricular systolic function is mildly reduced. The right ventricular size is mildly enlarged. Tricuspid regurgitation signal is inadequate for assessing PA pressure.  3. The mitral valve is abnormal. Moderately calcified leaflets. No evidence of mitral valve regurgitation.  4. Aortic dilatation noted. There is mild dilatation of the ascending aorta measuring 38 mm.  5. The aortic valve is abnormal. Severely calcified. Aortic valve regurgitation is mild. Severe aortic valve stenosis. Vmax 3.7 m/s, MG 35 mmHg, AVA 0.5 cm^2, DI 0.13. FINDINGS  Left Ventricle: Left ventricular ejection fraction, by estimation, is 40 to 45%. The left ventricle has mildly decreased function. The left ventricle demonstrates regional wall motion abnormalities. Definity contrast agent was given IV to delineate the left ventricular endocardial borders. The left ventricular internal cavity size was normal in size. There is severe asymmetric left ventricular hypertrophy of the basal-septal segment. Left ventricular diastolic parameters are indeterminate.  LV Wall Scoring: The inferior wall and posterior wall are akinetic. The mid inferoseptal segment and basal inferoseptal segment are hypokinetic. The entire anterior wall, antero-lateral wall, entire anterior septum, and entire apex are normal. Right Ventricle: The right ventricular size is mildly enlarged. Right vetricular wall thickness was not assessed. Right ventricular systolic function is mildly reduced. Tricuspid regurgitation signal is inadequate for assessing PA pressure. Left Atrium: Left atrial size was normal in size. Right Atrium: Right atrial size was normal in size. Pericardium: Trivial pericardial effusion is present.  Mitral Valve: The mitral valve is abnormal. There is moderate calcification of the mitral valve leaflet(s). No evidence of mitral valve regurgitation. Tricuspid Valve: The tricuspid valve is grossly normal. Tricuspid valve regurgitation is trivial. Aortic Valve: The aortic valve is abnormal. Aortic valve regurgitation is mild. Aortic regurgitation PHT measures 365 msec. Severe aortic stenosis is present. There is severe calcifcation of the aortic valve. Aortic valve mean gradient measures 32.2 mmHg. Aortic valve peak gradient measures 50.8 mmHg. Aortic valve area, by VTI measures 0.56  cm. Pulmonic Valve: The pulmonic valve was not well visualized. Pulmonic valve regurgitation is not visualized. Aorta: The aortic root is normal in size and structure and aortic dilatation noted. There is mild dilatation of the ascending aorta measuring 38 mm. IAS/Shunts: The interatrial septum was not well visualized.  LEFT VENTRICLE PLAX 2D LVIDd:         4.24 cm LVIDs:         3.65 cm LV PW:         1.47 cm LV IVS:        1.42 cm LVOT diam:     2.40 cm LV SV:         45 LV SV Index:   22 LVOT Area:     4.52 cm  RIGHT VENTRICLE RV Basal diam:  3.35 cm RV Mid diam:    2.18 cm RV S prime:     6.41 cm/s TAPSE (M-mode): 1.5 cm LEFT ATRIUM             Index       RIGHT ATRIUM           Index LA diam:        3.00 cm 1.48 cm/m  RA Area:     19.40 cm LA Vol (A2C):   70.7 ml 34.98 ml/m RA Volume:   53.60 ml  26.52 ml/m LA Vol (A4C):   58.8 ml 29.09 ml/m LA Biplane Vol: 64.9 ml 32.11 ml/m  AORTIC VALVE AV Area (Vmax):    0.65 cm AV Area (Vmean):   0.62 cm AV Area (VTI):     0.56 cm AV Vmax:           356.20 cm/s AV Vmean:          266.600 cm/s AV VTI:            0.804 m AV Peak Grad:      50.8 mmHg AV Mean Grad:      32.2 mmHg LVOT Vmax:         51.30 cm/s LVOT Vmean:        36.400 cm/s LVOT VTI:          0.099 m LVOT/AV VTI ratio: 0.12 AI PHT:            365 msec  AORTA Ao Root diam: 3.40 cm Ao Asc diam:  3.80 cm MITRAL VALVE MV  Area (PHT): 3.37 cm    SHUNTS MV Decel Time: 225 msec    Systemic VTI:  0.10 m MV E velocity: 69.00 cm/s  Systemic Diam: 2.40 cm MV A velocity: 62.90 cm/s MV E/A ratio:  1.10 Oswaldo Milian MD Electronically signed by Oswaldo Milian MD Signature Date/Time: 08/26/2019/1:19:31 PM    Final    CT Renal Stone Study  Result Date: 08/26/2019 CLINICAL DATA:  Hematuria.  Lower abdominal pain. EXAM: CT ABDOMEN AND PELVIS WITHOUT CONTRAST TECHNIQUE: Multidetector CT imaging of the abdomen and pelvis was performed following the standard protocol without IV contrast. COMPARISON:  01/18/2018 FINDINGS: Lower chest: A large hiatal hernia is again partially visualized with the majority of the stomach being located in the chest. There is associated compressive atelectasis in the left lower lobe. Trace left pleural effusion, smaller than on the prior study. Partially visualized pacemaker leads. Hepatobiliary: Subcentimeter hypodensity in the left hepatic lobe, too small to fully characterize. Unremarkable gallbladder. No biliary dilatation. Pancreas: Unremarkable. Spleen: Unremarkable. Adrenals/Urinary Tract: Unremarkable right adrenal gland. Unchanged slight left adrenal nodularity. Left renal calculi  measuring up to 5 mm in the lower pole. Vascular calcifications in the right renal hilum. Left renal cysts including an unchanged 5.1 cm cyst in the lower pole. Mild bilateral hydronephrosis and right hydroureter. Moderate bladder distension. 11 cm hyperdense mass dependently in the bladder with a small amount of gas likely reflecting a large hematoma. Foley catheter in place. Stomach/Bowel: There is no evidence of bowel obstruction or inflammation. 1 cm metallic density at the base of the cecum. Vascular/Lymphatic: Abdominal aortic atherosclerosis. Unchanged focal saccular dilatation of the infrarenal abdominal aorta with transverse diameter of 2.5 cm. No enlarged lymph nodes. Reproductive: Unremarkable prostate. Other:  Mild nonspecific stranding adjacent to the bladder and in the presacral region. No fluid collection or pneumoperitoneum. Musculoskeletal: Chronic T11 compression fracture with progressive severe vertebral body height loss. Unchanged chronic L3 compression fracture with mild height loss. IMPRESSION: 1. Large bladder hematoma with Foley catheter in place. Stranding about the bladder could indicate hemorrhagic cystitis. An underlying mass is not excluded. Consider urology consultation. 2. Mild bilateral hydronephrosis. 3. Nonobstructing left nephrolithiasis. 4. Large hiatal hernia. 5. Aortic Atherosclerosis (ICD10-I70.0). Electronically Signed   By: Logan Bores M.D.   On: 08/26/2019 04:38   CUP PACEART REMOTE DEVICE CHECK  Result Date: 08/10/2019 Scheduled remote reviewed. Normal device function.  Next remote 91 days- JBox, RN/CVRS   SIGNED: Deatra James, MD, FACP, FHM. [facility],  Pager (please use [email or web address] to page/text)  If 7PM-7AM, please contact night-coverage [email or web address], Password [facility] 08/27/2019, 11:09 AM Total of 66 minutes of intensive care time was spent on examining evaluating reviewing labs, ordering coordinating plan of care with consultants.

## 2019-08-27 NOTE — Progress Notes (Signed)
PT Cancellation Note  Patient Details Name: Adam [name] MRN: [id number] DOB: August 23, 1926   Cancelled Treatment:     PT order received but eval deferred - RN initiating transfusion.  Will follow.  [place] Pager [phone] Office [phone]    [facility] 08/27/2019, 3:24 PM

## 2019-08-27 NOTE — Progress Notes (Signed)
Looks good  Few small clots Keep cbi today ans see again tomorrow

## 2019-08-27 NOTE — Progress Notes (Addendum)
Progress Note  Patient Name: Adam [name] Date of Encounter: 08/27/2019  Primary Cardiologist: [name], MD  Subjective   Feeling much better today. Wife of 63 years says he looks like a whole new person. Breathing improved. No CP reported.  Inpatient Medications    Scheduled Meds: . acidophilus  1 capsule Oral TID  . amiodarone  100 mg Oral Daily  . Chlorhexidine Gluconate Cloth  6 each Topical Daily  . Chlorhexidine Gluconate Cloth  6 each Topical Q0600  . Droxidopa  100 mg Oral TID WC  . feeding supplement (ENSURE ENLIVE)  237 mL Oral BID BM  . insulin aspart  0-9 Units Subcutaneous TID WC  . levothyroxine  100 mcg Oral QAC breakfast  . mouth rinse  15 mL Mouth Rinse BID  . midodrine  5 mg Oral TID WC  . mupirocin ointment  1 application Nasal BID  . nystatin  5 mL Oral QID  . oxybutynin  10 mg Oral QHS  . Plecanatide  3 mg Oral Daily  . pravastatin  40 mg Oral QHS  . rOPINIRole  0.5 mg Oral BID  . sodium chloride flush  3 mL Intravenous Q12H   Continuous Infusions: . ceFEPime (MAXIPIME) IV Stopped (08/27/19 0253)  . sodium chloride irrigation    . vancomycin     PRN Meds: acetaminophen **OR** acetaminophen, methocarbamol, polyethylene glycol, prochlorperazine, rOPINIRole   Vital Signs    Vitals:   08/27/19 0452 08/27/19 0500 08/27/19 0600 08/27/19 0700  BP:  (!) 103/26 (!) 102/56 (!) 102/58  Pulse:  70 71 69  Resp:  15 16 19   Temp:      TempSrc:      SpO2:  100% 100% 100%  Weight: 84.1 kg     Height:        Intake/Output Summary (Last 24 hours) at 08/27/2019 0802 Last data filed at 08/27/2019 0700 Gross per 24 hour  Intake 33803.03 ml  Output 36600 ml  Net -2796.97 ml   Last 3 Weights 08/27/2019 08/26/2019 08/25/2019  Weight (lbs) 185 lb 6.5 oz 185 lb 6.5 oz 175 lb 3.2 oz  Weight (kg) 84.1 kg 84.1 kg 79.47 kg     Telemetry    Atrial pacing V sensed, normal rates - Personally Reviewed  Physical Exam   GEN: Frail appearing elderly WM in  acute distress.  HEENT: Normocephalic, atraumatic, sclera non-icteric. Neck: No JVD or bruits. Cardiac: RRR with 2/6 SEM without rubs or gallops, S2 diminished Radials/DP/PT 1+ and equal bilaterally.  Respiratory: Clear to auscultation bilaterally. Breathing is unlabored. GI: Soft, nontender, non-distended, BS +x 4. MS: no deformity. Extremities: No clubbing or cyanosis. No edema. Distal pedal pulses are 2+ and equal bilaterally. Neuro:  AAOx3. Follows commands. Psych:  Responds to questions appropriately with a normal affect.  Labs    High Sensitivity Troponin:   Recent Labs  Lab 08/26/19 0435 08/26/19 0801  TROPONINIHS 33* 47*      Cardiac EnzymesNo results for input(s): TROPONINI in the last 168 hours. No results for input(s): TROPIPOC in the last 168 hours.   Chemistry Recent Labs  Lab 08/25/19 0001 08/25/19 0001 08/26/19 0127 08/26/19 0801 08/27/19 0226  NA 140   < > 139 139 139  K 2.9*   < > 2.9* 3.5 5.0  CL 99   < > 99 102 103  CO2 26  --   --  16* 23  GLUCOSE 125*   < > 198* 204* 133*  BUN  15   < > 19 23 25*  CREATININE 0.87   < > 1.10 1.53* 0.96  CALCIUM 9.0  --   --  8.6* 8.5*  PROT  --   --   --   --  5.7*  ALBUMIN  --   --   --   --  2.7*  AST  --   --   --   --  20  ALT  --   --   --   --  12  ALKPHOS  --   --   --   --  53  BILITOT  --   --   --   --  0.8  GFRNONAA >60  --   --  39* >60  GFRAA >60  --   --  46* >60  ANIONGAP 15  --   --  21* 13   < > = values in this interval not displayed.     Hematology Recent Labs  Lab 08/25/19 0001 08/25/19 0001 08/26/19 0111 08/26/19 0127 08/27/19 0226  WBC 12.1*  --  17.2*  --  27.5*  RBC 3.95*  --  3.75*  --  2.46*  HGB 13.0   < > 12.4* 12.9* 8.0*  HCT 38.9*   < > 37.2* 38.0* 24.9*  MCV 98.5  --  99.2  --  101.2*  MCH 32.9  --  33.1  --  32.5  MCHC 33.4  --  33.3  --  32.1  RDW 14.3  --  14.5  --  15.1  PLT 258  --  303  --  247   < > = values in this interval not displayed.    BNPNo results  for input(s): BNP, PROBNP in the last 168 hours.   DDimer No results for input(s): DDIMER in the last 168 hours.   Radiology    CT HEAD WO CONTRAST  Result Date: 08/26/2019 CLINICAL DATA:  Altered mental status. EXAM: CT HEAD WITHOUT CONTRAST TECHNIQUE: Contiguous axial images were obtained from the base of the skull through the vertex without intravenous contrast. COMPARISON:  January 11, 2016. FINDINGS: Brain: Mild diffuse cortical atrophy is noted. Minimal chronic ischemic white matter disease is noted. No mass effect or midline shift is noted. Ventricular size is within normal limits. There is no evidence of mass lesion, hemorrhage or acute infarction. Vascular: No hyperdense vessel or unexpected calcification. Skull: Normal. Negative for fracture or focal lesion. Sinuses/Orbits: No acute finding. Other: None. IMPRESSION: Mild diffuse cortical atrophy. Minimal chronic ischemic white matter disease. No acute intracranial abnormality seen. Electronically Signed   By: Marijo Conception M.D.   On: 08/26/2019 14:50   DG Chest Portable 1 View  Result Date: 08/26/2019 CLINICAL DATA:  History of pain. EXAM: PORTABLE CHEST 1 VIEW COMPARISON:  CT abdomen 01/18/2018.  Chest x-ray 01/18/2018. FINDINGS: Cardiac pacer in stable position. Prior CABG. Stable cardiomegaly. No pulmonary venous congestion. Large hiatal hernia again noted. No focal infiltrate noted. No pleural effusion or pneumothorax. IMPRESSION: 1. Cardiac pacer stable position. Prior CABG. Stable cardiomegaly. No acute abnormality identified. Chest is stable from prior exam. 2.  Large hiatal hernia again noted. Electronically Signed   By: Marcello Moores  Register   On: 08/26/2019 06:18   ECHOCARDIOGRAM COMPLETE  Result Date: 08/26/2019    ECHOCARDIOGRAM REPORT   Patient Name:   YONA STANSBURY Date of Exam: 08/26/2019 Medical Rec #:  [id number]     Height:  70.0 in Accession #:    [id number]    Weight:       185.4 lb Date of Birth:  12-12-1926     BSA:           2.021 m Patient Age:    86 years      BP:           105/85 mmHg Patient Gender: M             HR:           70 bpm. Exam Location:  Inpatient Procedure: 2D Echo, Cardiac Doppler, Color Doppler and Intracardiac            Opacification Agent                     STAT ECHO Reported to: Dr Gardiner Rhyme on 08/26/2019 12:18:00 PM. Indications:    R94.31 Abnormal EKG  History:        Patient has prior history of Echocardiogram examinations, most                 recent 09/02/2017. CHF and Cardiomyopathy, Previous Myocardial                 Infarction and CAD, Pacemaker and Prior CABG, Stroke, Aortic                 Valve Disease, Arrythmias:RBBB, Signs/Symptoms:Dyspnea; Risk                 Factors:Hypertension, Dyslipidemia and GERD. Sick Sinus                 Syndrome.  Sonographer:    Tiffany Dance Referring Phys: [id number] Harold Hedge  Sonographer Comments: No subcostal window. IMPRESSIONS  1. Left ventricular ejection fraction, by estimation, is 40 to 45%. The left ventricle has mildly decreased function. The left ventricle demonstrates regional wall motion abnormalities (see scoring diagram/findings for description). Basal to mid inferior/inferolateral akinesis. There is severe asymmetric left ventricular hypertrophy of the basal-septal segment. Left ventricular diastolic parameters are indeterminate.  2. Right ventricular systolic function is mildly reduced. The right ventricular size is mildly enlarged. Tricuspid regurgitation signal is inadequate for assessing PA pressure.  3. The mitral valve is abnormal. Moderately calcified leaflets. No evidence of mitral valve regurgitation.  4. Aortic dilatation noted. There is mild dilatation of the ascending aorta measuring 38 mm.  5. The aortic valve is abnormal. Severely calcified. Aortic valve regurgitation is mild. Severe aortic valve stenosis. Vmax 3.7 m/s, MG 35 mmHg, AVA 0.5 cm^2, DI 0.13. FINDINGS  Left Ventricle: Left ventricular ejection fraction, by estimation, is  40 to 45%. The left ventricle has mildly decreased function. The left ventricle demonstrates regional wall motion abnormalities. Definity contrast agent was given IV to delineate the left ventricular endocardial borders. The left ventricular internal cavity size was normal in size. There is severe asymmetric left ventricular hypertrophy of the basal-septal segment. Left ventricular diastolic parameters are indeterminate.  LV Wall Scoring: The inferior wall and posterior wall are akinetic. The mid inferoseptal segment and basal inferoseptal segment are hypokinetic. The entire anterior wall, antero-lateral wall, entire anterior septum, and entire apex are normal. Right Ventricle: The right ventricular size is mildly enlarged. Right vetricular wall thickness was not assessed. Right ventricular systolic function is mildly reduced. Tricuspid regurgitation signal is inadequate for assessing PA pressure. Left Atrium: Left atrial size was normal in size. Right Atrium: Right atrial size was normal in size. Pericardium:  Trivial pericardial effusion is present. Mitral Valve: The mitral valve is abnormal. There is moderate calcification of the mitral valve leaflet(s). No evidence of mitral valve regurgitation. Tricuspid Valve: The tricuspid valve is grossly normal. Tricuspid valve regurgitation is trivial. Aortic Valve: The aortic valve is abnormal. Aortic valve regurgitation is mild. Aortic regurgitation PHT measures 365 msec. Severe aortic stenosis is present. There is severe calcifcation of the aortic valve. Aortic valve mean gradient measures 32.2 mmHg. Aortic valve peak gradient measures 50.8 mmHg. Aortic valve area, by VTI measures 0.56 cm. Pulmonic Valve: The pulmonic valve was not well visualized. Pulmonic valve regurgitation is not visualized. Aorta: The aortic root is normal in size and structure and aortic dilatation noted. There is mild dilatation of the ascending aorta measuring 38 mm. IAS/Shunts: The interatrial  septum was not well visualized.  LEFT VENTRICLE PLAX 2D LVIDd:         4.24 cm LVIDs:         3.65 cm LV PW:         1.47 cm LV IVS:        1.42 cm LVOT diam:     2.40 cm LV SV:         45 LV SV Index:   22 LVOT Area:     4.52 cm  RIGHT VENTRICLE RV Basal diam:  3.35 cm RV Mid diam:    2.18 cm RV S prime:     6.41 cm/s TAPSE (M-mode): 1.5 cm LEFT ATRIUM             Index       RIGHT ATRIUM           Index LA diam:        3.00 cm 1.48 cm/m  RA Area:     19.40 cm LA Vol (A2C):   70.7 ml 34.98 ml/m RA Volume:   53.60 ml  26.52 ml/m LA Vol (A4C):   58.8 ml 29.09 ml/m LA Biplane Vol: 64.9 ml 32.11 ml/m  AORTIC VALVE AV Area (Vmax):    0.65 cm AV Area (Vmean):   0.62 cm AV Area (VTI):     0.56 cm AV Vmax:           356.20 cm/s AV Vmean:          266.600 cm/s AV VTI:            0.804 m AV Peak Grad:      50.8 mmHg AV Mean Grad:      32.2 mmHg LVOT Vmax:         51.30 cm/s LVOT Vmean:        36.400 cm/s LVOT VTI:          0.099 m LVOT/AV VTI ratio: 0.12 AI PHT:            365 msec  AORTA Ao Root diam: 3.40 cm Ao Asc diam:  3.80 cm MITRAL VALVE MV Area (PHT): 3.37 cm    SHUNTS MV Decel Time: 225 msec    Systemic VTI:  0.10 m MV E velocity: 69.00 cm/s  Systemic Diam: 2.40 cm MV A velocity: 62.90 cm/s MV E/A ratio:  1.10 Oswaldo Milian MD Electronically signed by Oswaldo Milian MD Signature Date/Time: 08/26/2019/1:19:31 PM    Final    CT Renal Stone Study  Result Date: 08/26/2019 CLINICAL DATA:  Hematuria.  Lower abdominal pain. EXAM: CT ABDOMEN AND PELVIS WITHOUT CONTRAST TECHNIQUE: Multidetector CT imaging of the abdomen and pelvis was performed  following the standard protocol without IV contrast. COMPARISON:  01/18/2018 FINDINGS: Lower chest: A large hiatal hernia is again partially visualized with the majority of the stomach being located in the chest. There is associated compressive atelectasis in the left lower lobe. Trace left pleural effusion, smaller than on the prior study. Partially  visualized pacemaker leads. Hepatobiliary: Subcentimeter hypodensity in the left hepatic lobe, too small to fully characterize. Unremarkable gallbladder. No biliary dilatation. Pancreas: Unremarkable. Spleen: Unremarkable. Adrenals/Urinary Tract: Unremarkable right adrenal gland. Unchanged slight left adrenal nodularity. Left renal calculi measuring up to 5 mm in the lower pole. Vascular calcifications in the right renal hilum. Left renal cysts including an unchanged 5.1 cm cyst in the lower pole. Mild bilateral hydronephrosis and right hydroureter. Moderate bladder distension. 11 cm hyperdense mass dependently in the bladder with a small amount of gas likely reflecting a large hematoma. Foley catheter in place. Stomach/Bowel: There is no evidence of bowel obstruction or inflammation. 1 cm metallic density at the base of the cecum. Vascular/Lymphatic: Abdominal aortic atherosclerosis. Unchanged focal saccular dilatation of the infrarenal abdominal aorta with transverse diameter of 2.5 cm. No enlarged lymph nodes. Reproductive: Unremarkable prostate. Other: Mild nonspecific stranding adjacent to the bladder and in the presacral region. No fluid collection or pneumoperitoneum. Musculoskeletal: Chronic T11 compression fracture with progressive severe vertebral body height loss. Unchanged chronic L3 compression fracture with mild height loss. IMPRESSION: 1. Large bladder hematoma with Foley catheter in place. Stranding about the bladder could indicate hemorrhagic cystitis. An underlying mass is not excluded. Consider urology consultation. 2. Mild bilateral hydronephrosis. 3. Nonobstructing left nephrolithiasis. 4. Large hiatal hernia. 5. Aortic Atherosclerosis (ICD10-I70.0). Electronically Signed   By: Logan Bores M.D.   On: 08/26/2019 04:38    Cardiac Studies   2D Echo 08/26/19 1. Left ventricular ejection fraction, by estimation, is 40 to 45%. The  left ventricle has mildly decreased function. The left  ventricle  demonstrates regional wall motion abnormalities (see scoring  diagram/findings for description). Basal to mid  inferior/inferolateral akinesis. There is severe asymmetric left  ventricular hypertrophy of the basal-septal segment. Left ventricular  diastolic parameters are indeterminate.  2. Right ventricular systolic function is mildly reduced. The right  ventricular size is mildly enlarged. Tricuspid regurgitation signal is  inadequate for assessing PA pressure.  3. The mitral valve is abnormal. Moderately calcified leaflets. No  evidence of mitral valve regurgitation.  4. Aortic dilatation noted. There is mild dilatation of the ascending  aorta measuring 38 mm.  5. The aortic valve is abnormal. Severely calcified. Aortic valve  regurgitation is mild. Severe aortic valve stenosis. Vmax 3.7 m/s, MG 35  mmHg, AVA 0.5 cm^2, DI 0.13.   Patient Profile     86 y.o. 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 He was admitted with gross hematuria and large bladder hematoma requiring irrigation. Patient was thought to have episode of VT that did not require intervention, but pacemaker interrogation revealed this was v-paced rhythm with pacemaker-mediated tachycardia, NOT ventricular tachycardia. PPM settings adjusted. Yesterday had hypotension that was  better on contralateral arm, as well as improved with resumption of home  midodrine.  Assessment & Plan    1. Recurrent hematuria with bladder hematoma, hypotension, AKI, and development of ABL anemia with leukocytosis  - Hgb 12.9->8.0 today - aspirin on hold - torsemide on hold due to hypotension, continue home midodrine - per IM/urology - s/p irrigation, being started on abx for possible sepsis  2. Tachycardia - per device interrogation was NOT ventricular tachycardia but instead sinus tachycardia with ventricular pacing response and pacemaker mediated tachycardia. Reassuring. Settings adjusted. Now atrial pacing on telemetry. - home amiodarone resumed  - given known history of VT in the past, would still be vigilant about electrolyte maintenance and recommend to keep K 4.0 or greater (Mg not low recently)  3. CAD s/p CABG remotely with minimally elevated troponin - troponin is low/flat, not felt to represent ACS at this time. - ASA on hold due to #1 - continue statin - does not appear to have been on BB at home in context of known orthostasis  4. Chronic combined CHF  - LVEF actually improved slightly from prior at 40-45% (previously 35-40%) - fluid status looks OK although baseline weight has most recently been around 178lb (185 today) - not able to tolerate guideline directed management as OP due to h/o severe hypotension - torsemide on hold due to hypotension, consider resumption soon if BP will allow - I/O's will not be totally reliable given bladder irrigation counted in tallies  5. Severe AS by echo this admission - previously moderate, not surprisingly there has been progression. He is sedentary at baseline so challenging to assess degree of symptoms. - with current medical issues including anemia and hematuria requiring holding of antiplatelets, not presently a candidate for invasive management - will review with MD  Tentatively arranged f/u 8/13 per MD request, appt placed on AVS.  For questions or updates, please contact [place] Please consult [email or web address] for contact info under Cardiology/STEMI.  Signed, Charlie Pitter, PA-C 08/27/2019, 8:02 AM    Patient seen and examined with Melina Copa PA-C.  Agree as above, with the following exceptions and changes as noted below. He is sitting up in bed, smiling, feeling well. Gen: NAD, CV: RRR, 3/6 SEM, Lungs: clear, Abd: soft, Extrem: Warm, well perfused, no edema, Neuro/Psych: alert and oriented x 3, normal mood and affect. All available labs, radiology testing, previous records reviewed. EF stable to slightly improved. We discussed severe AS, natural history of AS and options for replacement. I have recommended that after his hospitalization he can review indications for TAVR with his primary cardiologist and subsequently undergo testing and workup if appropriate. Discussed in detail with family. Volume status should be monitored closely, may need IV lasix after blood products today.   Cardiology will see as needed over the weekend.   Elouise Munroe, MD 08/27/19 4:08 PM

## 2019-08-28 DIAGNOSIS — Z950 Presence of cardiac pacemaker: Secondary | ICD-10-CM | POA: Diagnosis not present

## 2019-08-28 DIAGNOSIS — I951 Orthostatic hypotension: Secondary | ICD-10-CM | POA: Diagnosis not present

## 2019-08-28 LAB — CBC
HCT: 23.9 % — ABNORMAL LOW (ref 39.0–52.0)
Hemoglobin: 7.5 g/dL — ABNORMAL LOW (ref 13.0–17.0)
MCH: 31.3 pg (ref 26.0–34.0)
MCHC: 31.4 g/dL (ref 30.0–36.0)
MCV: 99.6 fL (ref 80.0–100.0)
Platelets: 229 10*3/uL (ref 150–400)
RBC: 2.4 MIL/uL — ABNORMAL LOW (ref 4.22–5.81)
RDW: 16.1 % — ABNORMAL HIGH (ref 11.5–15.5)
WBC: 17 10*3/uL — ABNORMAL HIGH (ref 4.0–10.5)
nRBC: 0 % (ref 0.0–0.2)

## 2019-08-28 LAB — GLUCOSE, CAPILLARY
Glucose-Capillary: 110 mg/dL — ABNORMAL HIGH (ref 70–99)
Glucose-Capillary: 117 mg/dL — ABNORMAL HIGH (ref 70–99)
Glucose-Capillary: 115 mg/dL — ABNORMAL HIGH (ref 70–99)
Glucose-Capillary: 112 mg/dL — ABNORMAL HIGH (ref 70–99)

## 2019-08-28 LAB — BASIC METABOLIC PANEL
Anion gap: 9 (ref 5–15)
BUN: 18 mg/dL (ref 8–23)
CO2: 24 mmol/L (ref 22–32)
Calcium: 8.4 mg/dL — ABNORMAL LOW (ref 8.9–10.3)
Chloride: 102 mmol/L (ref 98–111)
Creatinine, Ser: 0.8 mg/dL (ref 0.61–1.24)
GFR calc Af Amer: >60 mL/min (ref >60)
GFR calc non Af Amer: >60 mL/min (ref >60)
Glucose, Bld: 117 mg/dL — ABNORMAL HIGH (ref 70–99)
Potassium: 4.3 mmol/L (ref 3.5–5.1)
Sodium: 135 mmol/L (ref 135–145)

## 2019-08-28 LAB — TROPONIN I (HIGH SENSITIVITY): Troponin I (High Sensitivity): 34 ng/L — ABNORMAL HIGH (ref <18)

## 2019-08-28 LAB — PREPARE RBC (CROSSMATCH)

## 2019-08-28 MED ORDER — SODIUM CHLORIDE 0.9% IV SOLUTION: Freq: Once | INTRAVENOUS | Status: AC

## 2019-08-28 MED ORDER — FUROSEMIDE 10 MG/ML IJ SOLN
40.0000 mg | Freq: Once | INTRAMUSCULAR | Status: AC
  Administered 2019-08-28: 40 mg via INTRAVENOUS
  Filled 2019-08-28: qty 4

## 2019-08-28 MED ORDER — SODIUM CHLORIDE 0.9 % IV SOLN
2.0000 g | Freq: Three times a day (TID) | INTRAVENOUS | Status: AC
  Administered 2019-08-28 – 2019-08-30 (×8): 2 g via INTRAVENOUS
  Filled 2019-08-28 (×8): qty 2

## 2019-08-28 NOTE — Progress Notes (Signed)
Urology Progress Note  Subjective: Afebrile, hemodynamically stable. Remains on amiodarone. Urine clear pink this morning on CBI slow drip. Small clots when irrigating this morning. Hb relatively stable at 7.5 from 8, slow drip.   Physical Exam:  Vital signs in last 24 hours: Temp:  [97.6 F (36.4 C)-99.1 F (37.3 C)] 99.1 F (37.3 C) (07/24 0400) Pulse Rate:  [69-75] 70 (07/24 0700) Resp:  [13-29] 20 (07/24 0700) BP: (89-118)/(28-81) 115/42 (07/24 0700) SpO2:  [96 %-100 %] 98 % (07/24 0700)  Cardiovascular: Skin warm; not flushed Respiratory: Breaths quiet; no shortness of breath Abdomen: No masses Neurological: Normal sensation to touch Lymphatics: No inguinal adenopathy Skin: No rashes Genitourinary:no distress on drip 22 three way foley, clear pink  Laboratory Data:  Creatinine: Recent Labs    08/25/19 0001 08/26/19 0127 08/26/19 0801 08/27/19 0226 08/28/19 0214  CREATININE 0.87 1.10 1.53* 0.96 0.80    Urine culture mixed species. Blood cultures negative x 48 hours.   Impression/Assessment:  Chronic retention; clot retention; large clot  Plan:  - Continue CBI on slow drip. Low concern for active bleeding, likely persistent clot breakdown products. - Recommend nursing to irrigate catheter Q shift.  - Recommend Bladder ultrasound today to re-evaluate for persistent presence of clot   Carmie Kanner 08/28/2019, 8:06 AM

## 2019-08-28 NOTE — Progress Notes (Signed)
PT Cancellation Note  Patient Details Name: Adam [name] MRN: [id number] DOB: 01-21-1927   Cancelled Treatment:     PT  Deferred at request of RN, pt transfusing.  Will follow.   Aroldo Galli 08/28/2019, 3:16 PM

## 2019-08-28 NOTE — Progress Notes (Signed)
Pharmacy Antibiotic Note  Adam [name] is a 86 y.o. male admitted on 08/25/2019 with hematuria and sepsis.  Pharmacy has been consulted for cefepime and vancomycin dosing. Hx MRSA and PsA UTIs.  08/28/19 1:31 PM  - SCr improved to 0.8 - afebrile - WBC improving  Plan:  Increase cefepime to 2 g iv q 8 h  Continue vancomycin to 1500 mg IV q24 hr with improved renal function   Follow renal function closely and adjust dosing as indicated  F/U cultures   Height: 5\' 10"  (177.8 cm) Weight: 84.1 kg (185 lb 6.5 oz) IBW/kg (Calculated) : 73  Temp (24hrs), Avg:98.3 F (36.8 C), Min:97.6 F (36.4 C), Max:99.1 F (37.3 C)  Recent Labs  Lab 08/25/19 0001 08/26/19 0111 08/26/19 0127 08/26/19 0801 08/27/19 0226 08/27/19 0752 08/27/19 1106 08/28/19 0214  WBC 12.1* 17.2*  --   --  27.5*  --   --  17.0*  CREATININE 0.87  --  1.10 1.53* 0.96  --   --  0.80  LATICACIDVEN  --   --   --   --   --  2.1* 4.1*  --     Estimated Creatinine Clearance: 63.4 mL/min (by C-G formula based on SCr of 0.8 mg/dL).    Allergies  Allergen Reactions  . Altace [Ramipril] Cough  . Crestor [Rosuvastatin Calcium] Rash  . Penicillins Rash and Other (See Comments)    Has patient had a PCN reaction causing immediate rash, facial/tongue/throat swelling, SOB or lightheadedness with hypotension: Yes Has patient had a PCN reaction causing severe rash involving mucus membranes or skin necrosis: No Has patient had a PCN reaction that required hospitalization No Has patient had a PCN reaction occurring within the last 10 years: No If all of the above answers are "NO", then may proceed with Cephalosporin use.   . Sulfa Antibiotics Rash  . Sulfamethoxazole-Trimethoprim Rash    Antimicrobials this admission: 7/22 ceftriaxone x 1 7/22 vancomycin >> 7/22 cefepime >>   Dose adjustments this admission: 7/23 Incr vanc to 1500 q24 with improved SCr 7/24 inc cefepime to 2 g q 8h  Microbiology results: 7/22  BCx: ngtd 7/22 UCx: mx spp. 7/22 Covid: neg 7/22 MRSA PCR: POS  Thank you for allowing pharmacy to be a part of this patient's care.  Ulice Dash D 08/28/2019 1:30 PM

## 2019-08-28 NOTE — Progress Notes (Signed)
Progress Note  Patient Name: Adam [name] Date of Encounter: 08/28/2019  Primary Cardiologist: [name], MD  Subjective   Mildly short of breath, tolerated receiving 1 u PRBC yesterday.  Inpatient Medications    Scheduled Meds: . acidophilus  1 capsule Oral TID  . amiodarone  100 mg Oral Daily  . Chlorhexidine Gluconate Cloth  6 each Topical Daily  . Chlorhexidine Gluconate Cloth  6 each Topical Q0600  . Droxidopa  100 mg Oral TID WC  . feeding supplement (ENSURE ENLIVE)  237 mL Oral BID BM  . insulin aspart  0-9 Units Subcutaneous TID WC  . levothyroxine  100 mcg Oral QAC breakfast  . mouth rinse  15 mL Mouth Rinse BID  . midodrine  5 mg Oral TID WC  . mupirocin ointment  1 application Nasal BID  . nystatin  5 mL Oral QID  . oxybutynin  10 mg Oral QHS  . Plecanatide  3 mg Oral Daily  . pravastatin  40 mg Oral QHS  . rOPINIRole  0.5 mg Oral BID  . sodium chloride flush  3 mL Intravenous Q12H   Continuous Infusions: . ceFEPime (MAXIPIME) IV Stopped (08/27/19 2250)  . sodium chloride irrigation    . vancomycin     PRN Meds: acetaminophen **OR** acetaminophen, methocarbamol, polyethylene glycol, prochlorperazine, rOPINIRole   Vital Signs    Vitals:   08/28/19 0400 08/28/19 0500 08/28/19 0600 08/28/19 0700  BP: (!) 105/39 (!) 107/40 (!) 111/36 (!) 115/42  Pulse: 70 71 72 70  Resp: 13 (!) 25 18 20   Temp: 99.1 F (37.3 C)     TempSrc: Axillary     SpO2: 96% 98% 98% 98%  Weight:      Height:        Intake/Output Summary (Last 24 hours) at 08/28/2019 0821 Last data filed at 08/28/2019 0659 Gross per 24 hour  Intake 31374.21 ml  Output 24725 ml  Net 6649.21 ml   Last 3 Weights 08/27/2019 08/26/2019 08/25/2019  Weight (lbs) 185 lb 6.5 oz 185 lb 6.5 oz 175 lb 3.2 oz  Weight (kg) 84.1 kg 84.1 kg 79.47 kg     Telemetry    Atrial pacing V sensed, normal rates - Personally Reviewed  Physical Exam   GEN: No acute distress.   Neck: No JVD Cardiac: RRR,  3/6 late peaking SEM Respiratory: Clear to auscultation bilaterally, shallow breaths due to positioning. GI: Soft, nontender, non-distended  MS: No edema; No deformity. Neuro:  Nonfocal  Psych: Normal affect    Labs    High Sensitivity Troponin:   Recent Labs  Lab 08/26/19 0435 08/26/19 0801  TROPONINIHS 33* 47*      Cardiac EnzymesNo results for input(s): TROPONINI in the last 168 hours. No results for input(s): TROPIPOC in the last 168 hours.   Chemistry Recent Labs  Lab 08/26/19 0801 08/27/19 0226 08/28/19 0214  NA 139 139 135  K 3.5 5.0 4.3  CL 102 103 102  CO2 16* 23 24  GLUCOSE 204* 133* 117*  BUN 23 25* 18  CREATININE 1.53* 0.96 0.80  CALCIUM 8.6* 8.5* 8.4*  PROT  --  5.7*  --   ALBUMIN  --  2.7*  --   AST  --  20  --   ALT  --  12  --   ALKPHOS  --  53  --   BILITOT  --  0.8  --   GFRNONAA 39* >60 >60  GFRAA 46* >60 >60  ANIONGAP 21* 13 9     Hematology Recent Labs  Lab 08/26/19 0111 08/26/19 0127 08/27/19 0226 08/27/19 1942 08/28/19 0214  WBC 17.2*  --  27.5*  --  17.0*  RBC 3.75*  --  2.46*  --  2.40*  HGB 12.4*   < > 8.0* 7.9* 7.5*  HCT 37.2*   < > 24.9* 24.8* 23.9*  MCV 99.2  --  101.2*  --  99.6  MCH 33.1  --  32.5  --  31.3  MCHC 33.3  --  32.1  --  31.4  RDW 14.5  --  15.1  --  16.1*  PLT 303  --  247  --  229   < > = values in this interval not displayed.    BNPNo results for input(s): BNP, PROBNP in the last 168 hours.   DDimer No results for input(s): DDIMER in the last 168 hours.   Radiology    CT HEAD WO CONTRAST  Result Date: 08/26/2019 CLINICAL DATA:  Altered mental status. EXAM: CT HEAD WITHOUT CONTRAST TECHNIQUE: Contiguous axial images were obtained from the base of the skull through the vertex without intravenous contrast. COMPARISON:  January 11, 2016. FINDINGS: Brain: Mild diffuse cortical atrophy is noted. Minimal chronic ischemic white matter disease is noted. No mass effect or midline shift is noted. Ventricular  size is within normal limits. There is no evidence of mass lesion, hemorrhage or acute infarction. Vascular: No hyperdense vessel or unexpected calcification. Skull: Normal. Negative for fracture or focal lesion. Sinuses/Orbits: No acute finding. Other: None. IMPRESSION: Mild diffuse cortical atrophy. Minimal chronic ischemic white matter disease. No acute intracranial abnormality seen. Electronically Signed   By: Marijo Conception M.D.   On: 08/26/2019 14:50   ECHOCARDIOGRAM COMPLETE  Result Date: 08/26/2019    ECHOCARDIOGRAM REPORT   Patient Name:   Adam [name] Date of Exam: 08/26/2019 Medical Rec #:  [id number]     Height:       70.0 in Accession #:    [id number]    Weight:       185.4 lb Date of Birth:  1927-01-25     BSA:          2.021 m Patient Age:    86 years      BP:           105/85 mmHg Patient Gender: M             HR:           70 bpm. Exam Location:  Inpatient Procedure: 2D Echo, Cardiac Doppler, Color Doppler and Intracardiac            Opacification Agent                     STAT ECHO Reported to: Dr Gardiner Rhyme on 08/26/2019 12:18:00 PM. Indications:    R94.31 Abnormal EKG  History:        Patient has prior history of Echocardiogram examinations, most                 recent 09/02/2017. CHF and Cardiomyopathy, Previous Myocardial                 Infarction and CAD, Pacemaker and Prior CABG, Stroke, Aortic                 Valve Disease, Arrythmias:RBBB, Signs/Symptoms:Dyspnea; Risk  Factors:Hypertension, Dyslipidemia and GERD. Sick Sinus                 Syndrome.  Sonographer:    Tiffany Dance Referring Phys: [id number] Harold Hedge  Sonographer Comments: No subcostal window. IMPRESSIONS  1. Left ventricular ejection fraction, by estimation, is 40 to 45%. The left ventricle has mildly decreased function. The left ventricle demonstrates regional wall motion abnormalities (see scoring diagram/findings for description). Basal to mid inferior/inferolateral akinesis. There is severe asymmetric  left ventricular hypertrophy of the basal-septal segment. Left ventricular diastolic parameters are indeterminate.  2. Right ventricular systolic function is mildly reduced. The right ventricular size is mildly enlarged. Tricuspid regurgitation signal is inadequate for assessing PA pressure.  3. The mitral valve is abnormal. Moderately calcified leaflets. No evidence of mitral valve regurgitation.  4. Aortic dilatation noted. There is mild dilatation of the ascending aorta measuring 38 mm.  5. The aortic valve is abnormal. Severely calcified. Aortic valve regurgitation is mild. Severe aortic valve stenosis. Vmax 3.7 m/s, MG 35 mmHg, AVA 0.5 cm^2, DI 0.13. FINDINGS  Left Ventricle: Left ventricular ejection fraction, by estimation, is 40 to 45%. The left ventricle has mildly decreased function. The left ventricle demonstrates regional wall motion abnormalities. Definity contrast agent was given IV to delineate the left ventricular endocardial borders. The left ventricular internal cavity size was normal in size. There is severe asymmetric left ventricular hypertrophy of the basal-septal segment. Left ventricular diastolic parameters are indeterminate.  LV Wall Scoring: The inferior wall and posterior wall are akinetic. The mid inferoseptal segment and basal inferoseptal segment are hypokinetic. The entire anterior wall, antero-lateral wall, entire anterior septum, and entire apex are normal. Right Ventricle: The right ventricular size is mildly enlarged. Right vetricular wall thickness was not assessed. Right ventricular systolic function is mildly reduced. Tricuspid regurgitation signal is inadequate for assessing PA pressure. Left Atrium: Left atrial size was normal in size. Right Atrium: Right atrial size was normal in size. Pericardium: Trivial pericardial effusion is present. Mitral Valve: The mitral valve is abnormal. There is moderate calcification of the mitral valve leaflet(s). No evidence of mitral valve  regurgitation. Tricuspid Valve: The tricuspid valve is grossly normal. Tricuspid valve regurgitation is trivial. Aortic Valve: The aortic valve is abnormal. Aortic valve regurgitation is mild. Aortic regurgitation PHT measures 365 msec. Severe aortic stenosis is present. There is severe calcifcation of the aortic valve. Aortic valve mean gradient measures 32.2 mmHg. Aortic valve peak gradient measures 50.8 mmHg. Aortic valve area, by VTI measures 0.56 cm. Pulmonic Valve: The pulmonic valve was not well visualized. Pulmonic valve regurgitation is not visualized. Aorta: The aortic root is normal in size and structure and aortic dilatation noted. There is mild dilatation of the ascending aorta measuring 38 mm. IAS/Shunts: The interatrial septum was not well visualized.  LEFT VENTRICLE PLAX 2D LVIDd:         4.24 cm LVIDs:         3.65 cm LV PW:         1.47 cm LV IVS:        1.42 cm LVOT diam:     2.40 cm LV SV:         45 LV SV Index:   22 LVOT Area:     4.52 cm  RIGHT VENTRICLE RV Basal diam:  3.35 cm RV Mid diam:    2.18 cm RV S prime:     6.41 cm/s TAPSE (M-mode): 1.5 cm LEFT ATRIUM  Index       RIGHT ATRIUM           Index LA diam:        3.00 cm 1.48 cm/m  RA Area:     19.40 cm LA Vol (A2C):   70.7 ml 34.98 ml/m RA Volume:   53.60 ml  26.52 ml/m LA Vol (A4C):   58.8 ml 29.09 ml/m LA Biplane Vol: 64.9 ml 32.11 ml/m  AORTIC VALVE AV Area (Vmax):    0.65 cm AV Area (Vmean):   0.62 cm AV Area (VTI):     0.56 cm AV Vmax:           356.20 cm/s AV Vmean:          266.600 cm/s AV VTI:            0.804 m AV Peak Grad:      50.8 mmHg AV Mean Grad:      32.2 mmHg LVOT Vmax:         51.30 cm/s LVOT Vmean:        36.400 cm/s LVOT VTI:          0.099 m LVOT/AV VTI ratio: 0.12 AI PHT:            365 msec  AORTA Ao Root diam: 3.40 cm Ao Asc diam:  3.80 cm MITRAL VALVE MV Area (PHT): 3.37 cm    SHUNTS MV Decel Time: 225 msec    Systemic VTI:  0.10 m MV E velocity: 69.00 cm/s  Systemic Diam: 2.40 cm MV A  velocity: 62.90 cm/s MV E/A ratio:  1.10 Oswaldo Milian MD Electronically signed by Oswaldo Milian MD Signature Date/Time: 08/26/2019/1:19:31 PM    Final     Cardiac Studies   2D Echo 08/26/19 1. Left ventricular ejection fraction, by estimation, is 40 to 45%. The  left ventricle has mildly decreased function. The left ventricle  demonstrates regional wall motion abnormalities (see scoring  diagram/findings for description). Basal to mid  inferior/inferolateral akinesis. There is severe asymmetric left  ventricular hypertrophy of the basal-septal segment. Left ventricular  diastolic parameters are indeterminate.  2. Right ventricular systolic function is mildly reduced. The right  ventricular size is mildly enlarged. Tricuspid regurgitation signal is  inadequate for assessing PA pressure.  3. The mitral valve is abnormal. Moderately calcified leaflets. No  evidence of mitral valve regurgitation.  4. Aortic dilatation noted. There is mild dilatation of the ascending  aorta measuring 38 mm.  5. The aortic valve is abnormal. Severely calcified. Aortic valve  regurgitation is mild. Severe aortic valve stenosis. Vmax 3.7 m/s, MG 35  mmHg, AVA 0.5 cm^2, DI 0.13.   Patient Profile     86 y.o. 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 He was admitted with gross hematuria and large bladder hematoma requiring irrigation. Patient  was thought to have episode of VT that did not require intervention, but pacemaker interrogation revealed this was v-paced rhythm with pacemaker-mediated tachycardia, NOT ventricular tachycardia. PPM settings adjusted. Yesterday had hypotension that was  better on contralateral arm, as well as improved with resumption of home midodrine.  Assessment & Plan    1. Recurrent hematuria with bladder hematoma, hypotension, AKI, and development of ABL anemia with leukocytosis  - Hgb 7.5 today despite 1 U PRBC - aspirin on hold - torsemide on hold due to hypotension, continue home midodrine - per IM/urology - s/p irrigation, being started on abx for possible sepsis - he is having chest pressure today. ECG at time of chest pressure unchanged. Will obtain single troponin for comparison to prior though anticipate it may be mildly elevated from demand ischemia in setting of anemia. With severe AS, reduced EF and symptoms, recommend additional blood transfusion today since anemia will contribute to demand ischemia. May need one dose of lasix afterward if short of breath.   2. Tachycardia - per device interrogation was NOT ventricular tachycardia but instead sinus tachycardia with ventricular pacing response and pacemaker mediated tachycardia. Reassuring. Settings adjusted. Now atrial pacing on telemetry. - home amiodarone resumed  - given known history of VT in the past, would still be vigilant about electrolyte maintenance and recommend to keep K 4.0 or greater (Mg not low recently)  3. CAD s/p CABG remotely with minimally elevated troponin - troponin is low/flat, not felt to represent ACS at this time. - ASA on hold due to #1 - continue statin - does not appear to have been on BB at home in context of known orthostasis  4. Chronic combined CHF  - LVEF actually improved slightly from prior at 40-45% (previously 35-40%) - fluid status looks OK although baseline weight has most recently been around 178lb  (185 today) - not able to tolerate guideline directed management as OP due to h/o severe hypotension - torsemide on hold due to hypotension, consider resumption soon if BP will allow - I/O's will not be totally reliable given bladder irrigation counted in tallies  5. Severe AS - He is sedentary at baseline so challenging to assess degree of symptoms. - with current medical issues including anemia and hematuria requiring holding of antiplatelets, not presently a candidate for invasive management - he is having chest pressure in setting of anemia, recommend supportive management of anemia with transfusion for goal Hgb >8.   Tentatively arranged f/u 8/13 per MD request, appt placed on AVS.  For questions or updates, please contact [place] Please consult [email or web address] for contact info under Cardiology/STEMI.  Signed, Elouise Munroe, MD 08/28/2019, 8:21 AM

## 2019-08-28 NOTE — Progress Notes (Signed)
PROGRESS NOTE    Adam [name]  BSW:[id number] DOB: 11/08/1926 DOA: 08/25/2019   PCP: [name], MD   Brief Narrative:  Adam [name] is a  86 year old male with past medical history of CAD s/p CABG in 1996 with extensive scarring of inferior wall MI, ischemic cardiomyopathy with chronic combined systolic and diastolic heart failure, sustained symptomatic VT on amiodarone, ischemic stroke, sinus node dysfunction with dual-chamber permanent pacemaker, severe orthostatic hypotension thought to be neurogenic and on midodrine, arthritis, hyperlipidemia, moderate AS, chronic urinary retention with indwelling Foley and previousTURP, previous hematuria and penile pain with multiple recent ED visits for penile and abdominal pain with blood in Foley catheter(7/10, 7/21-DC'd from ED)but returned the next day on 7/22 for the same.  In ED had an episode of V. tach episode- was placed on amiodarone drip in ED, developed hypotension, pacemaker was interrogated-amiodarone was DC'd, cardiology consulted, states patient is having sinus tachycardia with pacing, switch to oral amiodarone.  Urology consulted for hematuria recommended continue CBI, hematuria improving.  Hemoglobin dropped, receiving 2 units of PRBC. Due to history of Pseudomonas and MRSA UTIs , he was started on vancomycin and cefepime for concern of sepsis.   Assessment & Plan:   Active Problems:   Cardiomyopathy, ischemic   Pacemaker   Orthostatic hypotension   Hematuria   Non-sustained ventricular tachycardia (HCC)   Hypotension   SIRS (systemic inflammatory response syndrome) ([place])  # Recurrent hematuria with bladder hematoma and Chronic indwelling Foley for chronic urinary retention. - CT renal stone study: Large bladder hematoma with possible hemorrhagic cystitis underlying mass not excluded. - Urology on board:not a surgical candidate,continue CBI. Hematuria improving. - Hemoglobin dropped 12.9 >> 7.5 , received 1 PRBC  ,  Hb still 7.5 --- monitoring closely - Discussed with patient and family about blood transfusion , they agree with blood transfusion. - Mildly hypotensive, otherwise stable, blood pressure currently 118/49.  # Acute blood loss anemia due to hematuria. - Hemoglobin dropped from 12.9  >>> 8.0 - continue to monitor  H&H - S/P 1 PRBC, Hb 7.5, Transfuse 2 PRBC to keep Hb above 8.0  # Tachycardia with history of VT and dual-chamber pacemaker. - She was started on amiodarone drip for concern of VT - Per cardiology and device interrogation, patient did not experience VT but instead was sinus tachycardia with V pacing response and pacemaker mediated tachycardia - Change back to home p.o. amiodarone. -Goal K>4.0, goal Mg >2.0  #   Hypotension with history of  Chronic hypotension thought to be neurogenic -   Received several IV fluid boluses. -   Continue midodrine. -   Recheck BP in both Upper extremities.  # SIRS concern for sepsis. - Leukocytosis (17.2 >>27.5)with tachypnea and hypotension as above . - Lactic acid 2.1 - Hemodynamically stable, mental status at baseline. - Has a history of Pseudomonas and MRSA UTIs, concern for possible urinary source/bacterial translocation. - Continue vancomycin and cefepime as empiric coverage. -  F/U Blood cultures x2 >> P -  F/U  Urine culture >>  # Acute encephalopathy, suspect metabolic, AKIand hypotension induced ->> Improved. CT brain --reviewed, within normal limits. Correct electrolyte abnormalities Correct hypotension SLP eval  # AKI:  ->>>> Resolved. Likely from hemodynamic changes and urinary retention. Correct underlying issues above. Creatinine 1.53 >> 0.96  7. CAD s/p CABG Only elevated troponin unlikely ACS. Aspirin on hold due to hematuria. Continue statin Cardiology on board  8. Chronic combined systolic and diastolic heart failure.  Bilateral lower extremity edema on exam. Torsemide on hold due to hypotension and  AKI,resume when BP and creatinine improves. I/O and daily weight. Cautious iv hydration.   9. Moderate AS. Follow-up echo --pending Remain asymptomatic  10. Diabetes HA1C. Sensitive sliding scale     DVT prophylaxis: SCDs/ Compression stockings Code Status: Partial code. Family Communication:  Wife was present at bed side., Disposition Plan:  The above findings and plan of care has been discussed with patient (and family )  in detail,  they expressed understanding and agreement of above.  Dispo: The patient is from: Home  Anticipated d/c is to: Home  Anticipated d/c date is: > 3 days  Patient currently is not medically stable to d/c.  Hemodynamically unstable continue needing intervention, IV fluids, bladder irrigation, IV antibiotics    Consultants:   Urology, cardiology  Procedures: Antimicrobials: Anti-infectives (From admission, onward)   Start     Dose/Rate Route Frequency Ordered Stop   08/28/19 1000  vancomycin (VANCOREADY) IVPB 1500 mg/300 mL     Discontinue     1,500 mg 150 mL/hr over 120 Minutes Intravenous Every 24 hours 08/27/19 1511     08/27/19 1300  vancomycin (VANCOCIN) IVPB 1000 mg/200 mL premix  Status:  Discontinued        1,000 mg 200 mL/hr over 60 Minutes Intravenous Every 24 hours 08/26/19 1055 08/27/19 1511   08/27/19 0600  cefTRIAXone (ROCEPHIN) 1 g in sodium chloride 0.9 % 100 mL IVPB  Status:  Discontinued        1 g 200 mL/hr over 30 Minutes Intravenous Every 24 hours 08/26/19 1012 08/26/19 1027   08/26/19 2300  ceFEPIme (MAXIPIME) 2 g in sodium chloride 0.9 % 100 mL IVPB     Discontinue     2 g 200 mL/hr over 30 Minutes Intravenous Every 12 hours 08/26/19 1055     08/26/19 1130  vancomycin (VANCOREADY) IVPB 1750 mg/350 mL        1,750 mg 175 mL/hr over 120 Minutes Intravenous  Once 08/26/19 1055 08/26/19 1629   08/26/19 1130  ceFEPIme (MAXIPIME) 2 g in sodium chloride 0.9 % 100 mL IVPB         2 g 200 mL/hr over 30 Minutes Intravenous  Once 08/26/19 1055 08/26/19 1318   08/26/19 0500  cefTRIAXone (ROCEPHIN) 1 g in sodium chloride 0.9 % 100 mL IVPB        1 g 200 mL/hr over 30 Minutes Intravenous  Once 08/26/19 0454 08/26/19 0535      Subjective: Patient was seen and examined at bed side , awake,  Alert,  Oriented x 3.  Not in acute distress,  denies of having any shortness of breath or chest pain. C/o: generalized weakness. He has received 1 unit PRBC, Hb 7.5, Urine is getting clear. Wife present at bedside.... Plan of care was discussed in detail.   Objective: Vitals:   08/28/19 0700 08/28/19 0800 08/28/19 1102 08/28/19 1124  BP: (!) 115/42 (!) 100/34 (!) 112/49 (!) 106/48  Pulse: 70 75 70 70  Resp: 20 21 (!) 27 20  Temp:  99 F (37.2 C) 98.1 F (36.7 C) 98.6 F (37 C)  TempSrc:  Oral Oral Oral  SpO2: 98% 98% 99% 98%  Weight:      Height:        Intake/Output Summary (Last 24 hours) at 08/28/2019 1227 Last data filed at 08/28/2019 1047 Gross per 24 hour  Intake 24984.03 ml  Output 21425 ml  Net 3559.03 ml   Filed Weights   08/26/19 0615 08/27/19 0452  Weight: 84.1 kg 84.1 kg    Examination:  General exam: Appears calm and comfortable.  Respiratory system: Clear to auscultation. Respiratory effort normal. Cardiovascular system: S1 & S2 heard, RRR. No JVD, rubs, gallops or clicks.  2+ pedal edema. Gastrointestinal system: Abdomen is nondistended, soft and nontender. No organomegaly or masses felt. Normal bowel sounds heard. Central nervous system: Alert and oriented. No focal neurological deficits. Extremities: Pedal edema 2+, No rash. Skin: No rashes, lesions or ulcers Psychiatry: Judgement and insight appear normal. Mood & affect appropriate.  Foley catheter in place.   Data Reviewed: I have personally reviewed following labs and imaging studies  CBC: Recent Labs  Lab 08/25/19 0001 08/25/19 0001 08/26/19 0111 08/26/19 0127 08/27/19 0226  08/27/19 1942 08/28/19 0214  WBC 12.1*  --  17.2*  --  27.5*  --  17.0*  NEUTROABS  --   --  13.2*  --   --   --   --   HGB 13.0   < > 12.4* 12.9* 8.0* 7.9* 7.5*  HCT 38.9*   < > 37.2* 38.0* 24.9* 24.8* 23.9*  MCV 98.5  --  99.2  --  101.2*  --  99.6  PLT 258  --  303  --  247  --  229   < > = values in this interval not displayed.   Basic Metabolic Panel: Recent Labs  Lab 08/25/19 0001 08/26/19 0127 08/26/19 0435 08/26/19 0801 08/27/19 0226 08/28/19 0214  NA 140 139  --  139 139 135  K 2.9* 2.9*  --  3.5 5.0 4.3  CL 99 99  --  102 103 102  CO2 26  --   --  16* 23 24  GLUCOSE 125* 198*  --  204* 133* 117*  BUN 15 19  --  23 25* 18  CREATININE 0.87 1.10  --  1.53* 0.96 0.80  CALCIUM 9.0  --   --  8.6* 8.5* 8.4*  MG  --   --  3.1*  --  2.6*  --   PHOS  --   --  4.8*  --   --   --    GFR: Estimated Creatinine Clearance: 63.4 mL/min (by C-G formula based on SCr of 0.8 mg/dL). Liver Function Tests: Recent Labs  Lab 08/27/19 0226  AST 20  ALT 12  ALKPHOS 53  BILITOT 0.8  PROT 5.7*  ALBUMIN 2.7*   No results for input(s): LIPASE, AMYLASE in the last 168 hours. No results for input(s): AMMONIA in the last 168 hours. Coagulation Profile: Recent Labs  Lab 08/26/19 0111  INR 1.0   Cardiac Enzymes: No results for input(s): CKTOTAL, CKMB, CKMBINDEX, TROPONINI in the last 168 hours. BNP (last 3 results) No results for input(s): PROBNP in the last 8760 hours. HbA1C: Recent Labs    08/26/19 0110  HGBA1C 5.8*   CBG: Recent Labs  Lab 08/27/19 1200 08/27/19 1657 08/27/19 2255 08/28/19 0736 08/28/19 1200  GLUCAP 123* 118* 135* 110* 117*   Lipid Profile: No results for input(s): CHOL, HDL, LDLCALC, TRIG, CHOLHDL, LDLDIRECT in the last 72 hours. Thyroid Function Tests: No results for input(s): TSH, T4TOTAL, FREET4, T3FREE, THYROIDAB in the last 72 hours. Anemia Panel: No results for input(s): VITAMINB12, FOLATE, FERRITIN, TIBC, IRON, RETICCTPCT in the last 72  hours. Sepsis Labs: Recent Labs  Lab 08/27/19 0752 08/27/19 1106  LATICACIDVEN 2.1* 4.1*    Recent  Results (from the past 240 hour(s))  Culture, Urine     Status: Abnormal   Collection Time: 08/26/19  2:16 AM   Specimen: Urine, Random  Result Value Ref Range Status   Specimen Description   Final    URINE, RANDOM Performed at [facility] [address] [place], [place] [address]    Special Requests   Final    NONE Performed at [facility], [place] [address], [place], [place] [address]    Culture MULTIPLE SPECIES PRESENT, SUGGEST RECOLLECTION (A)  Final   Report Status 08/27/2019 FINAL  Final  SARS Coronavirus 2 by RT PCR (hospital order, performed in [facility] hospital lab) Nasopharyngeal Nasopharyngeal Swab     Status: None   Collection Time: 08/26/19  3:12 AM   Specimen: Nasopharyngeal Swab  Result Value Ref Range Status   SARS Coronavirus 2 NEGATIVE NEGATIVE Final    Comment: (NOTE) SARS-CoV-2 target nucleic acids are NOT DETECTED.  The SARS-CoV-2 RNA is generally detectable in upper and lower respiratory specimens during the acute phase of infection. The lowest concentration of SARS-CoV-2 viral copies this assay can detect is 250 copies / mL. A negative result does not preclude SARS-CoV-2 infection and should not be used as the sole basis for treatment or other patient management decisions.  A negative result may occur with improper specimen collection / handling, submission of specimen other than nasopharyngeal swab, presence of viral mutation(s) within the areas targeted by this assay, and inadequate number of viral copies (<250 copies / mL). A negative result must be combined with clinical observations, patient history, and epidemiological information.  Fact Sheet for Patients:   StrictlyIdeas.no  Fact Sheet for Healthcare Providers: BankingDealers.co.za  This test is not yet  approved or  cleared by the [place] FDA and has been authorized for detection and/or diagnosis of SARS-CoV-2 by FDA under an Emergency Use Authorization (EUA).  This EUA will remain in effect (meaning this test can be used) for the duration of the COVID-19 declaration under Section 564(b)(1) of the Act, 21 U.S.C. section 360bbb-3(b)(1), unless the authorization is terminated or revoked sooner.  Performed at [facility], [place] [address] [place], [place] [address]   MRSA PCR Screening     Status: Abnormal   Collection Time: 08/26/19  6:45 AM   Specimen: Nasopharyngeal  Result Value Ref Range Status   MRSA by PCR POSITIVE (A) NEGATIVE Final    Comment:        The GeneXpert MRSA Assay (FDA approved for NASAL specimens only), is one component of a comprehensive MRSA colonization surveillance program. It is not intended to diagnose MRSA infection nor to guide or monitor treatment for MRSA infections. RESULT CALLED TO, READ BACK BY AND VERIFIED WITH: CARTER,K @ [id number] BY POTEAT,S Performed at [facility], [place] [address], [place], [place] [address]   Culture, blood (routine x 2)     Status: None (Preliminary result)   Collection Time: 08/26/19 10:58 AM   Specimen: BLOOD RIGHT HAND  Result Value Ref Range Status   Specimen Description   Final    BLOOD RIGHT HAND Performed at [facility] [address] [place], [place] [address]    Special Requests   Final    BOTTLES DRAWN AEROBIC ONLY Blood Culture results may not be optimal due to an inadequate volume of blood received in culture bottles Performed at [facility] [address] [place],  [place] [address]    Culture   Final    NO GROWTH 2 DAYS Performed at [facility], [place] [address], [place], [place] [address]    Report Status PENDING  Incomplete  Culture, blood (routine x 2)     Status: None (Preliminary result)    Collection Time: 08/26/19 10:58 AM   Specimen: BLOOD LEFT HAND  Result Value Ref Range Status   Specimen Description   Final    BLOOD LEFT HAND Performed at [facility] [address], [place], [place] [address]    Special Requests   Final    BOTTLES DRAWN AEROBIC AND ANAEROBIC Blood Culture adequate volume Performed at [facility] [address], [place], [place] [address]    Culture   Final    NO GROWTH 2 DAYS Performed at [facility] [address] [place], [place] [address]    Report Status PENDING  Incomplete     Radiology Studies: CT HEAD WO CONTRAST  Result Date: 08/26/2019 CLINICAL DATA:  Altered mental status. EXAM: CT HEAD WITHOUT CONTRAST TECHNIQUE: Contiguous axial images were obtained from the base of the skull through the vertex without intravenous contrast. COMPARISON:  January 11, 2016. FINDINGS: Brain: Mild diffuse cortical atrophy is noted. Minimal chronic ischemic white matter disease is noted. No mass effect or midline shift is noted. Ventricular size is within normal limits. There is no evidence of mass lesion, hemorrhage or acute infarction. Vascular: No hyperdense vessel or unexpected calcification. Skull: Normal. Negative for fracture or focal lesion. Sinuses/Orbits: No acute finding. Other: None. IMPRESSION: Mild diffuse cortical atrophy. Minimal chronic ischemic white matter disease. No acute intracranial abnormality seen. Electronically Signed   By: Marijo Conception M.D.   On: 08/26/2019 14:50     Scheduled Meds: . acidophilus  1 capsule Oral TID  . amiodarone  100 mg Oral Daily  . Chlorhexidine Gluconate Cloth  6 each Topical Daily  . Chlorhexidine Gluconate Cloth  6 each Topical Q0600  . Droxidopa  100 mg Oral TID WC  . feeding supplement (ENSURE ENLIVE)  237 mL Oral BID BM  . insulin aspart  0-9 Units Subcutaneous TID WC  . levothyroxine  100 mcg Oral QAC breakfast  . mouth rinse  15 mL Mouth Rinse BID   . midodrine  5 mg Oral TID WC  . mupirocin ointment  1 application Nasal BID  . nystatin  5 mL Oral QID  . oxybutynin  10 mg Oral QHS  . Plecanatide  3 mg Oral Daily  . pravastatin  40 mg Oral QHS  . rOPINIRole  0.5 mg Oral BID  . sodium chloride flush  3 mL Intravenous Q12H   Continuous Infusions: . ceFEPime (MAXIPIME) IV Stopped (08/28/19 1003)  . sodium chloride irrigation    . vancomycin       LOS: 2 days    Time spent:  35 mins.    Shawna Clamp, MD [facility]   If 7PM-7AM, please contact night-coverage

## 2019-08-28 NOTE — Progress Notes (Signed)
RN paged on call [facility] for Hgb of 7.9 and 7.5. No new orders at this time

## 2019-08-29 ENCOUNTER — Inpatient Hospital Stay (HOSPITAL_COMMUNITY): Payer: Medicare PPO

## 2019-08-29 ENCOUNTER — Encounter: Payer: Self-pay | Admitting: Nurse Practitioner

## 2019-08-29 DIAGNOSIS — G903 Multi-system degeneration of the autonomic nervous system: Secondary | ICD-10-CM

## 2019-08-29 DIAGNOSIS — R319 Hematuria, unspecified: Secondary | ICD-10-CM | POA: Diagnosis not present

## 2019-08-29 LAB — BPAM RBC
Blood Product Expiration Date: 202108012359
Blood Product Expiration Date: 202108012359
Blood Product Expiration Date: 202108192359
ISSUE DATE / TIME: 202107231500
ISSUE DATE / TIME: 202107241104
ISSUE DATE / TIME: 202107241731
Unit Type and Rh: 6200
Unit Type and Rh: 6200
Unit Type and Rh: 6200

## 2019-08-29 LAB — CBC
HCT: 29.2 % — ABNORMAL LOW (ref 39.0–52.0)
Hemoglobin: 9.6 g/dL — ABNORMAL LOW (ref 13.0–17.0)
MCH: 31.5 pg (ref 26.0–34.0)
MCHC: 32.9 g/dL (ref 30.0–36.0)
MCV: 95.7 fL (ref 80.0–100.0)
Platelets: 206 10*3/uL (ref 150–400)
RBC: 3.05 MIL/uL — ABNORMAL LOW (ref 4.22–5.81)
RDW: 17.1 % — ABNORMAL HIGH (ref 11.5–15.5)
WBC: 10.8 10*3/uL — ABNORMAL HIGH (ref 4.0–10.5)
nRBC: 0.2 % (ref 0.0–0.2)

## 2019-08-29 LAB — TYPE AND SCREEN
ABO/RH(D): A POS
Antibody Screen: NEGATIVE
Unit division: 0
Unit division: 0
Unit division: 0

## 2019-08-29 LAB — COMPREHENSIVE METABOLIC PANEL
ALT: 18 U/L (ref 0–44)
AST: 33 U/L (ref 15–41)
Albumin: 2.6 g/dL — ABNORMAL LOW (ref 3.5–5.0)
Alkaline Phosphatase: 55 U/L (ref 38–126)
Anion gap: 11 (ref 5–15)
BUN: 16 mg/dL (ref 8–23)
CO2: 24 mmol/L (ref 22–32)
Calcium: 8.1 mg/dL — ABNORMAL LOW (ref 8.9–10.3)
Chloride: 102 mmol/L (ref 98–111)
Creatinine, Ser: 0.77 mg/dL (ref 0.61–1.24)
GFR calc Af Amer: >60 mL/min (ref >60)
GFR calc non Af Amer: >60 mL/min (ref >60)
Glucose, Bld: 98 mg/dL (ref 70–99)
Potassium: 3.7 mmol/L (ref 3.5–5.1)
Sodium: 137 mmol/L (ref 135–145)
Total Bilirubin: 0.7 mg/dL (ref 0.3–1.2)
Total Protein: 6.1 g/dL — ABNORMAL LOW (ref 6.5–8.1)

## 2019-08-29 LAB — GLUCOSE, CAPILLARY
Glucose-Capillary: 88 mg/dL (ref 70–99)
Glucose-Capillary: 97 mg/dL (ref 70–99)
Glucose-Capillary: 129 mg/dL — ABNORMAL HIGH (ref 70–99)
Glucose-Capillary: 102 mg/dL — ABNORMAL HIGH (ref 70–99)

## 2019-08-29 LAB — HEMOGLOBIN AND HEMATOCRIT, BLOOD
HCT: 30.5 % — ABNORMAL LOW (ref 39.0–52.0)
Hemoglobin: 10 g/dL — ABNORMAL LOW (ref 13.0–17.0)

## 2019-08-29 LAB — PHOSPHORUS: Phosphorus: 2.1 mg/dL — ABNORMAL LOW (ref 2.5–4.6)

## 2019-08-29 LAB — MAGNESIUM: Magnesium: 2.7 mg/dL — ABNORMAL HIGH (ref 1.7–2.4)

## 2019-08-29 MED ORDER — TORSEMIDE 20 MG PO TABS
20.0000 mg | ORAL_TABLET | Freq: Every day | ORAL | Status: DC
  Administered 2019-08-29: 20 mg via ORAL
  Filled 2019-08-29 (×2): qty 1

## 2019-08-29 MED ORDER — K PHOS MONO-SOD PHOS DI & MONO 155-852-130 MG PO TABS
250.0000 mg | ORAL_TABLET | Freq: Two times a day (BID) | ORAL | Status: AC
  Administered 2019-08-29 – 2019-08-30 (×4): 250 mg via ORAL
  Filled 2019-08-29 (×4): qty 1

## 2019-08-29 NOTE — Assessment & Plan Note (Signed)
Chronic DOE, underwent pulmonology eval, f/u cardiology, continue Torsemide

## 2019-08-29 NOTE — Progress Notes (Addendum)
Progress Note  Patient Name: Adam [name] Date of Encounter: 08/29/2019  Primary Cardiologist: [name], MD  Subjective   Feeling well, no additional chest pressure after receiving blood yesterday.   Inpatient Medications    Scheduled Meds: . acidophilus  1 capsule Oral TID  . amiodarone  100 mg Oral Daily  . Chlorhexidine Gluconate Cloth  6 each Topical Daily  . Chlorhexidine Gluconate Cloth  6 each Topical Q0600  . Droxidopa  100 mg Oral TID WC  . feeding supplement (ENSURE ENLIVE)  237 mL Oral BID BM  . insulin aspart  0-9 Units Subcutaneous TID WC  . levothyroxine  100 mcg Oral QAC breakfast  . mouth rinse  15 mL Mouth Rinse BID  . midodrine  5 mg Oral TID WC  . mupirocin ointment  1 application Nasal BID  . nystatin  5 mL Oral QID  . oxybutynin  10 mg Oral QHS  . phosphorus  250 mg Oral BID  . Plecanatide  3 mg Oral Daily  . pravastatin  40 mg Oral QHS  . rOPINIRole  0.5 mg Oral BID  . sodium chloride flush  3 mL Intravenous Q12H   Continuous Infusions: . ceFEPime (MAXIPIME) IV Stopped (08/29/19 0540)  . sodium chloride irrigation    . vancomycin Stopped (08/28/19 1639)   PRN Meds: acetaminophen **OR** acetaminophen, methocarbamol, polyethylene glycol, prochlorperazine, rOPINIRole   Vital Signs    Vitals:   08/29/19 0453 08/29/19 0500 08/29/19 0600 08/29/19 0808  BP:  (!) 112/53 (!) 101/45   Pulse:  70 70   Resp:  18 15   Temp: 99.5 F (37.5 C)   98.9 F (37.2 C)  TempSrc: Axillary   Axillary  SpO2:  100% 100%   Weight:  79.8 kg    Height:        Intake/Output Summary (Last 24 hours) at 08/29/2019 0837 Last data filed at 08/29/2019 0659 Gross per 24 hour  Intake 19111.25 ml  Output 21000 ml  Net -1888.75 ml   Last 3 Weights 08/29/2019 08/27/2019 08/26/2019  Weight (lbs) 175 lb 14.8 oz 185 lb 6.5 oz 185 lb 6.5 oz  Weight (kg) 79.8 kg 84.1 kg 84.1 kg     Telemetry    Atrial pacing V sensed, normal rates - Personally Reviewed  Physical  Exam   GEN: No acute distress.   Neck: No JVD Cardiac: RRR, 3/6 late peaking SEM Respiratory: Clear to auscultation bilaterally. GI: Soft, nontender, non-distended  MS: No edema; No deformity. Neuro:  Nonfocal  Psych: Normal affect    Labs    High Sensitivity Troponin:   Recent Labs  Lab 08/26/19 0435 08/26/19 0801 08/28/19 0917  TROPONINIHS 33* 47* 34*      Cardiac EnzymesNo results for input(s): TROPONINI in the last 168 hours. No results for input(s): TROPIPOC in the last 168 hours.   Chemistry Recent Labs  Lab 08/27/19 0226 08/28/19 0214 08/29/19 0233  NA 139 135 137  K 5.0 4.3 3.7  CL 103 102 102  CO2 23 24 24   GLUCOSE 133* 117* 98  BUN 25* 18 16  CREATININE 0.96 0.80 0.77  CALCIUM 8.5* 8.4* 8.1*  PROT 5.7*  --  6.1*  ALBUMIN 2.7*  --  2.6*  AST 20  --  33  ALT 12  --  18  ALKPHOS 53  --  55  BILITOT 0.8  --  0.7  GFRNONAA >60 >60 >60  GFRAA >60 >60 >60  ANIONGAP 13  9 11     Hematology Recent Labs  Lab 08/27/19 0226 08/27/19 1942 08/28/19 0214 08/28/19 2232 08/29/19 0233  WBC 27.5*  --  17.0*  --  10.8*  RBC 2.46*  --  2.40*  --  3.05*  HGB 8.0*   < > 7.5* 10.0* 9.6*  HCT 24.9*   < > 23.9* 30.5* 29.2*  MCV 101.2*  --  99.6  --  95.7  MCH 32.5  --  31.3  --  31.5  MCHC 32.1  --  31.4  --  32.9  RDW 15.1  --  16.1*  --  17.1*  PLT 247  --  229  --  206   < > = values in this interval not displayed.    BNPNo results for input(s): BNP, PROBNP in the last 168 hours.   DDimer No results for input(s): DDIMER in the last 168 hours.   Radiology    No results found.  Cardiac Studies   2D Echo 08/26/19 1. Left ventricular ejection fraction, by estimation, is 40 to 45%. The  left ventricle has mildly decreased function. The left ventricle  demonstrates regional wall motion abnormalities (see scoring  diagram/findings for description). Basal to mid  inferior/inferolateral akinesis. There is severe asymmetric left  ventricular hypertrophy  of the basal-septal segment. Left ventricular  diastolic parameters are indeterminate.  2. Right ventricular systolic function is mildly reduced. The right  ventricular size is mildly enlarged. Tricuspid regurgitation signal is  inadequate for assessing PA pressure.  3. The mitral valve is abnormal. Moderately calcified leaflets. No  evidence of mitral valve regurgitation.  4. Aortic dilatation noted. There is mild dilatation of the ascending  aorta measuring 38 mm.  5. The aortic valve is abnormal. Severely calcified. Aortic valve  regurgitation is mild. Severe aortic valve stenosis. Vmax 3.7 m/s, MG 35  mmHg, AVA 0.5 cm^2, DI 0.13.   Patient Profile     86 y.o. 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 He was admitted with gross hematuria and large bladder hematoma requiring irrigation. Patient was thought to have episode of VT that did not require intervention, but pacemaker interrogation revealed this was v-paced rhythm with pacemaker-mediated tachycardia, NOT ventricular tachycardia. PPM settings adjusted. Yesterday had hypotension that was better on contralateral arm, as well as improved with resumption of home midodrine.  Assessment & Plan    1. Recurrent hematuria with bladder hematoma, hypotension, AKI, and development of ABL anemia with leukocytosis  - aspirin on hold - torsemide on hold due to hypotension, continue home  midodrine - per IM/urology - s/p irrigation, abx for possible sepsis - no further chest pressure after receiving blood yesterday, appropriate Hgb response.   2. Tachycardia - per device interrogation was NOT ventricular tachycardia but instead sinus tachycardia with ventricular pacing response and pacemaker mediated tachycardia. Reassuring. Settings adjusted. Now atrial pacing on telemetry. - home amiodarone resumed  - given known history of VT in the past, would still be vigilant about electrolyte maintenance and recommend to keep K 4.0 or  greater, Mg >2  3. CAD s/p CABG remotely with minimally elevated troponin - troponin is low/flat, not felt to represent ACS at this time. - ASA on hold due to #1 - continue statin - does not appear to have been on BB at home in context of known orthostasis  4. Chronic combined CHF  - LVEF 40-45% (previously 35-40%) - fluid status looks OK although baseline weight has most recently been around 178lb (175lb today) - not able to tolerate guideline directed management as OP due to h/o severe hypotension - torsemide on hold due to hypotension, consider resumption soon if BP will allow - around - 3L in setting of bladder irrigation. Received 1 dose of lasix IV yesterday after transfusion.   5. Severe AS -  - with current medical issues including anemia and hematuria requiring holding of antiplatelets, not presently a candidate for invasive management - he had chest pressure in setting of anemia when Hgb 7.5, recommend supportive management of anemia with transfusion for goal Hgb >8.   Tentatively arranged f/u 8/13.  Dr. Sallyanne Kuster to assume care tomorrow on hospital service, his primary cardiologist.  For questions or updates, please contact [facility] Please consult [email or web address] for contact info under Cardiology/STEMI.  Signed, Elouise Munroe, MD 08/29/2019, 8:37 AM

## 2019-08-29 NOTE — Progress Notes (Signed)
PROGRESS NOTE    Adam [name]  EUM:[id number] DOB: Jun 19, 1926 DOA: 08/25/2019   PCP: [name], MD   Brief Narrative:  Adam [name] is a  86 year old male with past medical history of CAD s/p CABG in 1996 with extensive scarring of inferior wall MI, ischemic cardiomyopathy with chronic combined systolic and diastolic heart failure, sustained symptomatic VT on amiodarone, ischemic stroke, sinus node dysfunction with dual-chamber permanent pacemaker, severe orthostatic hypotension thought to be neurogenic and on midodrine, arthritis, hyperlipidemia, moderate AS, chronic urinary retention with indwelling Foley and previousTURP, previous hematuria and penile pain with multiple recent ED visits for penile and abdominal pain with blood in Foley catheter(7/10, 7/21-DC'd from ED)but returned the next day on 7/22 for the same.  In ED had an episode of V. tach episode- was placed on amiodarone drip in ED, developed hypotension, pacemaker was interrogated-amiodarone was DC'd, cardiology consulted, states patient is having sinus tachycardia with pacing, switch to oral amiodarone.  Urology consulted for hematuria recommended continue CBI, hematuria improving.  Hemoglobin dropped, received 3 units of PRBC. Hb improved 9.6. Due to history of Pseudomonas and MRSA UTIs , he was started on vancomycin and cefepime for concern of sepsis.   Assessment & Plan:   Active Problems:   Cardiomyopathy, ischemic   Pacemaker   Orthostatic hypotension   Hematuria   Non-sustained ventricular tachycardia (HCC)   Hypotension   SIRS (systemic inflammatory response syndrome) (HCC)  # Recurrent hematuria with bladder hematoma and Chronic indwelling Foley for chronic urinary retention- Improving. - CT renal stone study: Large bladder hematoma with possible hemorrhagic cystitis underlying mass not excluded. - Urology on board:not a surgical candidate, Hematuria improving. Discontinue CBI.  - Hemoglobin dropped  12.9 >> 7.5 , received 3 PRBC  , Hb  Improved 9.6 --- monitoring closely - Discussed with patient and family about blood transfusion , they agreed with blood transfusion. - BP improved with transfusion.  # Acute blood loss anemia due to hematuria. - Hemoglobin dropped from 12.9  >>> 8.0 - continue to monitor  H&H - S/P 3 PRBC, Hb 9.6, keep Hb above 8.0  # Tachycardia with history of VT and dual-chamber pacemaker. - She was started on amiodarone drip for concern of VT - Per cardiology and device interrogation, patient did not experience VT but instead was sinus tachycardia with V pacing response and pacemaker mediated tachycardia. - Change back to home p.o. amiodarone. -Goal K>4.0, goal Mg >2.0  #   Hypotension with history of  Chronic hypotension thought to be neurogenic - Improved. -   Received several IV fluid boluses. -   Continue midodrine. -   Recheck BP in both Upper extremities.  # SIRS concern for sepsis. - Leukocytosis (17.2 >>27.5)with tachypnea and hypotension as above . - Lactic acid 2.1 - Hemodynamically stable, mental status at baseline. - Has a history of Pseudomonas and MRSA UTIs, concern for possible urinary source/bacterial translocation. - Continue vancomycin and cefepime as empiric coverage. -  F/U Blood cultures x2 >> P -  F/U  Urine culture >>  # Acute encephalopathy, suspect metabolic, AKIand hypotension induced ->> Improved. CT brain --reviewed, within normal limits. Correct electrolyte abnormalities Hypotension - Improved. SLP eval  # AKI:  ->>>> Resolved. Likely from hemodynamic changes and urinary retention. Correct underlying issues above. Creatinine 1.53 >> 0.96  7. CAD s/p CABG slightly elevated troponin unlikely ACS. Aspirin on hold due to hematuria. Continue statin. Cardiology on board  8. Chronic combined systolic and diastolic  heart failure. Bilateral lower extremity edema on exam. Torsemide was on hold due to hypotension  and AKI,resumed today as BP and creatinine improves. I/O and daily weight. Cautious iv hydration.   9. Moderate AS. Follow-up echo --pending Remain asymptomatic  10. Diabetes HA1C. Sensitive sliding scale   DVT prophylaxis: SCDs/ Compression stockings Code Status: Partial code. Family Communication:  Wife was present at bed side., Disposition Plan:  The above findings and plan of care has been discussed with patient (and family )  in detail,  they expressed understanding and agreement of above.  Dispo: The patient is from: Home  Anticipated d/c is to: Home  Anticipated d/c date is: > 3 days  Patient currently is not medically stable to d/c.  Hemodynamically unstable, continue needing intervention, IV fluids, bladder irrigation, IV antibiotics    Consultants:   Urology, cardiology  Procedures: Antimicrobials: Anti-infectives (From admission, onward)   Start     Dose/Rate Route Frequency Ordered Stop   08/28/19 1400  ceFEPIme (MAXIPIME) 2 g in sodium chloride 0.9 % 100 mL IVPB     Discontinue     2 g 200 mL/hr over 30 Minutes Intravenous Every 8 hours 08/28/19 1330     08/28/19 1000  vancomycin (VANCOREADY) IVPB 1500 mg/300 mL     Discontinue     1,500 mg 150 mL/hr over 120 Minutes Intravenous Every 24 hours 08/27/19 1511     08/27/19 1300  vancomycin (VANCOCIN) IVPB 1000 mg/200 mL premix  Status:  Discontinued        1,000 mg 200 mL/hr over 60 Minutes Intravenous Every 24 hours 08/26/19 1055 08/27/19 1511   08/27/19 0600  cefTRIAXone (ROCEPHIN) 1 g in sodium chloride 0.9 % 100 mL IVPB  Status:  Discontinued        1 g 200 mL/hr over 30 Minutes Intravenous Every 24 hours 08/26/19 1012 08/26/19 1027   08/26/19 2300  ceFEPIme (MAXIPIME) 2 g in sodium chloride 0.9 % 100 mL IVPB  Status:  Discontinued        2 g 200 mL/hr over 30 Minutes Intravenous Every 12 hours 08/26/19 1055 08/28/19 1330   08/26/19 1130  vancomycin  (VANCOREADY) IVPB 1750 mg/350 mL        1,750 mg 175 mL/hr over 120 Minutes Intravenous  Once 08/26/19 1055 08/26/19 1629   08/26/19 1130  ceFEPIme (MAXIPIME) 2 g in sodium chloride 0.9 % 100 mL IVPB        2 g 200 mL/hr over 30 Minutes Intravenous  Once 08/26/19 1055 08/26/19 1318   08/26/19 0500  cefTRIAXone (ROCEPHIN) 1 g in sodium chloride 0.9 % 100 mL IVPB        1 g 200 mL/hr over 30 Minutes Intravenous  Once 08/26/19 0454 08/26/19 0535      Subjective: Patient was seen and examined at bed side , awake,  Alert,  Oriented x 3.  Not in acute distress,   C/o: generalized weakness.  Urine is getting clear, CBI discontinued, c/o bilateral arm swellings. Wife present at bedside.... Plan of care was discussed in detail.   Objective: Vitals:   08/29/19 0808 08/29/19 0900 08/29/19 1000 08/29/19 1100  BP:  127/73 (!) 116/51 (!) 119/53  Pulse:  70 69 70  Resp:  19 20 21   Temp: 98.9 F (37.2 C)     TempSrc: Axillary     SpO2:  99% 99% 100%  Weight:      Height:        Intake/Output  Summary (Last 24 hours) at 08/29/2019 1207 Last data filed at 08/29/2019 0900 Gross per 24 hour  Intake 18911.43 ml  Output 20700 ml  Net -1788.57 ml   Filed Weights   08/26/19 0615 08/27/19 0452 08/29/19 0500  Weight: 84.1 kg 84.1 kg 79.8 kg    Examination:  General exam: Appears calm and comfortable.  Respiratory system: Clear to auscultation. Respiratory effort normal. Cardiovascular system: S1 & S2 heard, RRR. No JVD, rubs, gallops or clicks.  2+ pedal edema. Gastrointestinal system: Abdomen is nondistended, soft and nontender. No organomegaly or masses felt. Normal bowel sounds heard. Central nervous system: Alert and oriented. No focal neurological deficits. Extremities: Pedal edema 2+, No rash. Bilateral arm swelling noted. Skin: No rashes, lesions or ulcers Psychiatry: Judgement and insight appear normal. Mood & affect appropriate.  Foley catheter in place.   Data Reviewed: I have  personally reviewed following labs and imaging studies  CBC: Recent Labs  Lab 08/25/19 0001 08/25/19 0001 08/26/19 0111 08/26/19 0127 08/27/19 0226 08/27/19 1942 08/28/19 0214 08/28/19 2232 08/29/19 0233  WBC 12.1*  --  17.2*  --  27.5*  --  17.0*  --  10.8*  NEUTROABS  --   --  13.2*  --   --   --   --   --   --   HGB 13.0   < > 12.4*   < > 8.0* 7.9* 7.5* 10.0* 9.6*  HCT 38.9*   < > 37.2*   < > 24.9* 24.8* 23.9* 30.5* 29.2*  MCV 98.5  --  99.2  --  101.2*  --  99.6  --  95.7  PLT 258  --  303  --  247  --  229  --  206   < > = values in this interval not displayed.   Basic Metabolic Panel: Recent Labs  Lab 08/25/19 0001 08/25/19 0001 08/26/19 0127 08/26/19 0435 08/26/19 0801 08/27/19 0226 08/28/19 0214 08/29/19 0233  NA 140   < > 139  --  139 139 135 137  K 2.9*   < > 2.9*  --  3.5 5.0 4.3 3.7  CL 99   < > 99  --  102 103 102 102  CO2 26  --   --   --  16* 23 24 24   GLUCOSE 125*   < > 198*  --  204* 133* 117* 98  BUN 15   < > 19  --  23 25* 18 16  CREATININE 0.87   < > 1.10  --  1.53* 0.96 0.80 0.77  CALCIUM 9.0  --   --   --  8.6* 8.5* 8.4* 8.1*  MG  --   --   --  3.1*  --  2.6*  --  2.7*  PHOS  --   --   --  4.8*  --   --   --  2.1*   < > = values in this interval not displayed.   GFR: Estimated Creatinine Clearance: 63.4 mL/min (by C-G formula based on SCr of 0.77 mg/dL). Liver Function Tests: Recent Labs  Lab 08/27/19 0226 08/29/19 0233  AST 20 33  ALT 12 18  ALKPHOS 53 55  BILITOT 0.8 0.7  PROT 5.7* 6.1*  ALBUMIN 2.7* 2.6*   No results for input(s): LIPASE, AMYLASE in the last 168 hours. No results for input(s): AMMONIA in the last 168 hours. Coagulation Profile: Recent Labs  Lab 08/26/19 0111  INR 1.0   Cardiac  Enzymes: No results for input(s): CKTOTAL, CKMB, CKMBINDEX, TROPONINI in the last 168 hours. BNP (last 3 results) No results for input(s): PROBNP in the last 8760 hours. HbA1C: No results for input(s): HGBA1C in the last 72  hours. CBG: Recent Labs  Lab 08/28/19 1200 08/28/19 1646 08/28/19 2054 08/29/19 0745 08/29/19 1150  GLUCAP 117* 115* 112* 88 97   Lipid Profile: No results for input(s): CHOL, HDL, LDLCALC, TRIG, CHOLHDL, LDLDIRECT in the last 72 hours. Thyroid Function Tests: No results for input(s): TSH, T4TOTAL, FREET4, T3FREE, THYROIDAB in the last 72 hours. Anemia Panel: No results for input(s): VITAMINB12, FOLATE, FERRITIN, TIBC, IRON, RETICCTPCT in the last 72 hours. Sepsis Labs: Recent Labs  Lab 08/27/19 0752 08/27/19 1106  LATICACIDVEN 2.1* 4.1*    Recent Results (from the past 240 hour(s))  Culture, Urine     Status: Abnormal   Collection Time: 08/26/19  2:16 AM   Specimen: Urine, Random  Result Value Ref Range Status   Specimen Description   Final    URINE, RANDOM Performed at [facility] [address], [place], [place] [address]    Special Requests   Final    NONE Performed at [facility], [place] [address] [place], [place] [address]    Culture MULTIPLE SPECIES PRESENT, SUGGEST RECOLLECTION (A)  Final   Report Status 08/27/2019 FINAL  Final  SARS Coronavirus 2 by RT PCR (hospital order, performed in [facility] hospital lab) Nasopharyngeal Nasopharyngeal Swab     Status: None   Collection Time: 08/26/19  3:12 AM   Specimen: Nasopharyngeal Swab  Result Value Ref Range Status   SARS Coronavirus 2 NEGATIVE NEGATIVE Final    Comment: (NOTE) SARS-CoV-2 target nucleic acids are NOT DETECTED.  The SARS-CoV-2 RNA is generally detectable in upper and lower respiratory specimens during the acute phase of infection. The lowest concentration of SARS-CoV-2 viral copies this assay can detect is 250 copies / mL. A negative result does not preclude SARS-CoV-2 infection and should not be used as the sole basis for treatment or other patient management decisions.  A negative result may occur with improper specimen collection / handling,  submission of specimen other than nasopharyngeal swab, presence of viral mutation(s) within the areas targeted by this assay, and inadequate number of viral copies (<250 copies / mL). A negative result must be combined with clinical observations, patient history, and epidemiological information.  Fact Sheet for Patients:   StrictlyIdeas.no  Fact Sheet for Healthcare Providers: BankingDealers.co.za  This test is not yet approved or  cleared by the [place] FDA and has been authorized for detection and/or diagnosis of SARS-CoV-2 by FDA under an Emergency Use Authorization (EUA).  This EUA will remain in effect (meaning this test can be used) for the duration of the COVID-19 declaration under Section 564(b)(1) of the Act, 21 U.S.C. section 360bbb-3(b)(1), unless the authorization is terminated or revoked sooner.  Performed at [facility], [place] [address] [place], [place] [address]   MRSA PCR Screening     Status: Abnormal   Collection Time: 08/26/19  6:45 AM   Specimen: Nasopharyngeal  Result Value Ref Range Status   MRSA by PCR POSITIVE (A) NEGATIVE Final    Comment:        The GeneXpert MRSA Assay (FDA approved for NASAL specimens only), is one component of a comprehensive MRSA colonization surveillance program. It is not intended to diagnose MRSA infection nor to guide or monitor treatment for MRSA infections.  RESULT CALLED TO, READ BACK BY AND VERIFIED WITH: CARTER,K @ 0355 HR [id number] BY POTEAT,S Performed at [facility], [place] [address], [place], [place] [address]   Culture, blood (routine x 2)     Status: None (Preliminary result)   Collection Time: 08/26/19 10:58 AM   Specimen: BLOOD RIGHT HAND  Result Value Ref Range Status   Specimen Description   Final    BLOOD RIGHT HAND Performed at [facility] [address] [place], [place] [address]    Special  Requests   Final    BOTTLES DRAWN AEROBIC ONLY Blood Culture results may not be optimal due to an inadequate volume of blood received in culture bottles Performed at [facility] [address] [place], [place] [address]    Culture   Final    NO GROWTH 3 DAYS Performed at [facility], [place] [address] [place], [place] [address]    Report Status PENDING  Incomplete  Culture, blood (routine x 2)     Status: None (Preliminary result)   Collection Time: 08/26/19 10:58 AM   Specimen: BLOOD LEFT HAND  Result Value Ref Range Status   Specimen Description   Final    BLOOD LEFT HAND Performed at [facility] [address], [place], [place] [address]    Special Requests   Final    BOTTLES DRAWN AEROBIC AND ANAEROBIC Blood Culture adequate volume Performed at [facility] [address], [place], [place] [address]    Culture   Final    NO GROWTH 3 DAYS Performed at [facility], [place] [address] [place], [place] [address]    Report Status PENDING  Incomplete     Radiology Studies: US PELVIS LIMITED (TRANSABDOMINAL ONLY)  Result Date: 08/29/2019 CLINICAL DATA:  Hematuria. EXAM: LIMITED ULTRASOUND OF PELVIS TECHNIQUE: Limited transabdominal ultrasound examination of the pelvis was performed. COMPARISON:  None. FINDINGS: Examination of the pelvis performed for evaluation of the bladder due to persistent clot/hematuria. Foley catheter is present within the bladder as the bladder is empty and therefore difficult to evaluate. No other focal abnormality identified. IMPRESSION: Foley catheter present within a decompressed bladder. No focal abnormality visualized. Electronically Signed   By: Marin Olp M.D.   On: 08/29/2019 09:48     Scheduled Meds: . acidophilus  1 capsule Oral TID  . amiodarone  100 mg Oral Daily  . Chlorhexidine Gluconate Cloth  6 each Topical Daily  . Chlorhexidine Gluconate Cloth  6 each Topical Q0600   . Droxidopa  100 mg Oral TID WC  . feeding supplement (ENSURE ENLIVE)  237 mL Oral BID BM  . insulin aspart  0-9 Units Subcutaneous TID WC  . levothyroxine  100 mcg Oral QAC breakfast  . mouth rinse  15 mL Mouth Rinse BID  . midodrine  5 mg Oral TID WC  . mupirocin ointment  1 application Nasal BID  . nystatin  5 mL Oral QID  . oxybutynin  10 mg Oral QHS  . phosphorus  250 mg Oral BID  . Plecanatide  3 mg Oral Daily  . pravastatin  40 mg Oral QHS  . rOPINIRole  0.5 mg Oral BID  . sodium chloride flush  3 mL Intravenous Q12H  . torsemide  20 mg Oral Daily   Continuous Infusions: . ceFEPime (MAXIPIME) IV Stopped (08/29/19 0540)  . sodium chloride irrigation    . vancomycin 1,500 mg (08/29/19 1100)  LOS: 3 days    Time spent:  35 mins.  Shawna Clamp, MD [facility]   If 7PM-7AM, please contact night-coverage

## 2019-08-29 NOTE — Assessment & Plan Note (Signed)
Heart rate is in control, continue Amiodarone.  

## 2019-08-29 NOTE — Assessment & Plan Note (Signed)
Multiple sides, takes Tylenol, Methocarbamol for pain control.

## 2019-08-29 NOTE — Evaluation (Signed)
Physical Therapy Evaluation Patient Details Name: Adam [name] MRN: [id number] DOB: 1926-10-14 Today's Date: 08/29/2019   History of Present Illness  Pt admitted with acute encephalopathy, hypotension and hematuria with bladder hematoma.  Pt wtih hx of CABG, pacemaker, MI, CAD, DM, cardiomyopathy, and orthostatic hypotension.  Pt also with chronic in-dwelling catheter for chronic urinary retention..  Clinical Impression  Pt admitted as above and presenting with functional mobility limitations 2* generalized weakness, balance deficits, limited endurance and c/o dizziness with attempts at OOB activity.  Pt plans dc back to SNF at [facility]    Follow Up Recommendations SNF    Equipment Recommendations  None recommended by PT    Recommendations for Other Services       Precautions / Restrictions Precautions Precautions: Fall Restrictions Weight Bearing Restrictions: No      Mobility  Bed Mobility Overal bed mobility: Needs Assistance Bed Mobility: Supine to Sit;Sit to Supine     Supine to sit: Mod assist;+2 for physical assistance;+2 for safety/equipment Sit to supine: Mod assist;+2 for physical assistance;+2 for safety/equipment   General bed mobility comments: Assist to manage LEs, to bring trunk to upright and to complete rotation to EOB sitting using bed pad  Transfers                 General transfer comment: Deferred 2* increasing dizziness in sitting EOB  Ambulation/Gait                Stairs            Wheelchair Mobility    Modified Rankin (Stroke Patients Only)       Balance Overall balance assessment: Needs assistance Sitting-balance support: No upper extremity supported;Feet supported Sitting balance-Leahy Scale: Fair                                       Pertinent Vitals/Pain Pain Assessment: No/denies pain    Home Living Family/patient expects to be discharged to:: Skilled nursing facility                  Additional Comments: Pt is in skilled nursing area at [facility].    Prior Function Level of Independence: Needs assistance   Gait / Transfers Assistance Needed: Pt transfers bed<>chair with assist but has been nonambulatory since COVID     Comments: Nonambulatory since COVID     Hand Dominance   Dominant Hand: Right    Extremity/Trunk Assessment   Upper Extremity Assessment Upper Extremity Assessment: Generalized weakness    Lower Extremity Assessment Lower Extremity Assessment: Generalized weakness    Cervical / Trunk Assessment Cervical / Trunk Assessment: Kyphotic  Communication   Communication: HOH  Cognition Arousal/Alertness: Awake/alert Behavior During Therapy: WFL for tasks assessed/performed Overall Cognitive Status: Within Functional Limits for tasks assessed                                        General Comments      Exercises     Assessment/Plan    PT Assessment Patient needs continued PT services  PT Problem List Decreased strength;Decreased activity tolerance;Decreased balance;Decreased mobility;Decreased knowledge of use of DME       PT Treatment Interventions DME instruction;Functional mobility training;Therapeutic activities;Therapeutic exercise;Balance training;Patient/family education;Gait training    PT Goals (Current goals can be  found in the Care Plan section)  Acute Rehab PT Goals Patient Stated Goal: Get out of this bed PT Goal Formulation: With patient Time For Goal Achievement: 09/12/19 Potential to Achieve Goals: Fair    Frequency Min 2X/week   Barriers to discharge        Co-evaluation               AM-PAC PT "6 Clicks" Mobility  Outcome Measure Help needed turning from your back to your side while in a flat bed without using bedrails?: A Lot Help needed moving from lying on your back to sitting on the side of a flat bed without using bedrails?: A Lot Help needed moving to and from a  bed to a chair (including a wheelchair)?: A Lot Help needed standing up from a chair using your arms (e.g., wheelchair or bedside chair)?: A Lot Help needed to walk in hospital room?: Total Help needed climbing 3-5 steps with a railing? : Total 6 Click Score: 10    End of Session Equipment Utilized During Treatment: Gait belt Activity Tolerance: Other (comment);Patient limited by fatigue (dizziness) Patient left: in bed;with call bell/phone within reach;with family/visitor present Nurse Communication: Mobility status PT Visit Diagnosis: Muscle weakness (generalized) (M62.81);Difficulty in walking, not elsewhere classified (R26.2)    Time: 1610-9604 PT Time Calculation (min) (ACUTE ONLY): 32 min   Charges:   PT Evaluation $PT Eval Low Complexity: 1 Low PT Treatments $Therapeutic Activity: 8-22 mins        Debe Coder PT Acute Rehabilitation Services Pager [phone] Office [phone]   Adam [name] 08/29/2019, 1:27 PM

## 2019-08-29 NOTE — Assessment & Plan Note (Signed)
Chronic issue, controlled, continue Trulance, Senokot S, MiraLax.

## 2019-08-29 NOTE — Assessment & Plan Note (Signed)
Continue Droxidopa, Midodrine

## 2019-08-29 NOTE — Assessment & Plan Note (Signed)
His mood is stable, continue Duloxetine.  

## 2019-08-29 NOTE — Assessment & Plan Note (Signed)
Stable, continue Pantoprazole.  

## 2019-08-29 NOTE — Assessment & Plan Note (Signed)
Supplemented in ED, needs Kcl and f/u BMP

## 2019-08-29 NOTE — Progress Notes (Signed)
Urology Progress Note  Subjective: Afebrile, hemodynamically stable. Remains on amiodarone. Transfused 1 u pRBC yesterday, Hb responded appropriately to 9.6 this morning. Urine clear yellow on very slow CBI drip, did not require irrigation overnight. CBI clamped on morning rounds.   Physical Exam:  Vital signs in last 24 hours: Temp:  [97.9 F (36.6 C)-100.3 F (37.9 C)] 98.9 F (37.2 C) (07/25 0808) Pulse Rate:  [70-72] 70 (07/25 0600) Resp:  [14-27] 15 (07/25 0600) BP: (80-140)/(45-93) 101/45 (07/25 0600) SpO2:  [98 %-100 %] 100 % (07/25 0600) Weight:  [79.8 kg] 79.8 kg (07/25 0500)  Cardiovascular: Skin warm; not flushed Respiratory: Breaths quiet; no shortness of breath Skin: No rashes Genitourinary: urine clear yellow on slow drip CBI  Laboratory Data:  Creatinine: Recent Labs    08/25/19 0001 08/26/19 0127 08/26/19 0801 08/27/19 0226 08/28/19 0214 08/29/19 0233  CREATININE 0.87 1.10 1.53* 0.96 0.80 0.77    Urine culture mixed species. Blood cultures negative x 48 hours.   Impression/Assessment:  86 y.o. male with chronic foley catheter, gross hematuria requiring CBI. Urine clear yellow today. Bladder ultrasound without evidence of persistent clot  Plan:  - Ok to clamp CBI today  - If urine remains clear today/tomorrow, would consider downsizing catheter prior to discharge. Please contact urology prior to any catheter exchange - Patient will need to be scheduled for outpatient follow up with Urology for cystoscopy  - Urine and blood cultures negative, could consider to transitioning to oral empiric antibiotic for possible UTI, recommend 10 day course   Carmie Kanner 08/29/2019, 9:05 AM

## 2019-08-29 NOTE — Assessment & Plan Note (Signed)
S/p CABG x4, continue ASA

## 2019-08-29 NOTE — Assessment & Plan Note (Signed)
TSH 1.71 03/15/19, continue Levothyroxine.  

## 2019-08-29 NOTE — Progress Notes (Signed)
CBI clamped and disconnected per urology. Catheter plug placed in open lumen; will continue to monitor pt output.

## 2019-08-29 NOTE — Assessment & Plan Note (Signed)
Stable, continue Requip

## 2019-08-30 DIAGNOSIS — Z950 Presence of cardiac pacemaker: Secondary | ICD-10-CM

## 2019-08-30 DIAGNOSIS — I25708 Atherosclerosis of coronary artery bypass graft(s), unspecified, with other forms of angina pectoris: Secondary | ICD-10-CM

## 2019-08-30 DIAGNOSIS — G903 Multi-system degeneration of the autonomic nervous system: Secondary | ICD-10-CM | POA: Diagnosis not present

## 2019-08-30 DIAGNOSIS — R319 Hematuria, unspecified: Secondary | ICD-10-CM

## 2019-08-30 DIAGNOSIS — I951 Orthostatic hypotension: Secondary | ICD-10-CM

## 2019-08-30 DIAGNOSIS — I350 Nonrheumatic aortic (valve) stenosis: Secondary | ICD-10-CM

## 2019-08-30 DIAGNOSIS — I5042 Chronic combined systolic (congestive) and diastolic (congestive) heart failure: Secondary | ICD-10-CM

## 2019-08-30 LAB — CBC
HCT: 30.5 % — ABNORMAL LOW (ref 39.0–52.0)
Hemoglobin: 9.8 g/dL — ABNORMAL LOW (ref 13.0–17.0)
MCH: 30.8 pg (ref 26.0–34.0)
MCHC: 32.1 g/dL (ref 30.0–36.0)
MCV: 95.9 fL (ref 80.0–100.0)
Platelets: 244 10*3/uL (ref 150–400)
RBC: 3.18 MIL/uL — ABNORMAL LOW (ref 4.22–5.81)
RDW: 16.3 % — ABNORMAL HIGH (ref 11.5–15.5)
WBC: 8.5 10*3/uL (ref 4.0–10.5)
nRBC: 0.2 % (ref 0.0–0.2)

## 2019-08-30 LAB — BASIC METABOLIC PANEL
Anion gap: 9 (ref 5–15)
BUN: 15 mg/dL (ref 8–23)
CO2: 27 mmol/L (ref 22–32)
Calcium: 7.7 mg/dL — ABNORMAL LOW (ref 8.9–10.3)
Chloride: 99 mmol/L (ref 98–111)
Creatinine, Ser: 0.72 mg/dL (ref 0.61–1.24)
GFR calc Af Amer: >60 mL/min (ref >60)
GFR calc non Af Amer: >60 mL/min (ref >60)
Glucose, Bld: 104 mg/dL — ABNORMAL HIGH (ref 70–99)
Potassium: 3.1 mmol/L — ABNORMAL LOW (ref 3.5–5.1)
Sodium: 135 mmol/L (ref 135–145)

## 2019-08-30 LAB — GLUCOSE, CAPILLARY
Glucose-Capillary: 100 mg/dL — ABNORMAL HIGH (ref 70–99)
Glucose-Capillary: 115 mg/dL — ABNORMAL HIGH (ref 70–99)
Glucose-Capillary: 96 mg/dL (ref 70–99)
Glucose-Capillary: 129 mg/dL — ABNORMAL HIGH (ref 70–99)

## 2019-08-30 LAB — PHOSPHORUS: Phosphorus: 3.1 mg/dL (ref 2.5–4.6)

## 2019-08-30 MED ORDER — CEPHALEXIN 500 MG PO CAPS
500.0000 mg | ORAL_CAPSULE | Freq: Three times a day (TID) | ORAL | Status: DC
  Administered 2019-08-31 – 2019-09-03 (×11): 500 mg via ORAL
  Filled 2019-08-30 (×12): qty 1

## 2019-08-30 MED ORDER — POTASSIUM CHLORIDE 20 MEQ PO PACK
40.0000 meq | PACK | Freq: Once | ORAL | Status: AC
  Administered 2019-08-30: 40 meq via ORAL
  Filled 2019-08-30: qty 2

## 2019-08-30 NOTE — Progress Notes (Signed)
Progress Note  Patient Name: Adam [name] Date of Encounter: 08/30/2019  [facility] Cardiologist: [name], MD   Subjective   No CV complaints today. No hematuria.  Inpatient Medications    Scheduled Meds: . acidophilus  1 capsule Oral TID  . amiodarone  100 mg Oral Daily  . Chlorhexidine Gluconate Cloth  6 each Topical Daily  . Droxidopa  100 mg Oral TID WC  . feeding supplement (ENSURE ENLIVE)  237 mL Oral BID BM  . insulin aspart  0-9 Units Subcutaneous TID WC  . levothyroxine  100 mcg Oral QAC breakfast  . mouth rinse  15 mL Mouth Rinse BID  . midodrine  5 mg Oral TID WC  . mupirocin ointment  1 application Nasal BID  . nystatin  5 mL Oral QID  . oxybutynin  10 mg Oral QHS  . phosphorus  250 mg Oral BID  . Plecanatide  3 mg Oral Daily  . pravastatin  40 mg Oral QHS  . rOPINIRole  0.5 mg Oral BID  . sodium chloride flush  3 mL Intravenous Q12H   Continuous Infusions: . ceFEPime (MAXIPIME) IV 2 g (08/30/19 3151)  . sodium chloride irrigation    . vancomycin 1,500 mg (08/30/19 1000)   PRN Meds: acetaminophen **OR** acetaminophen, methocarbamol, polyethylene glycol, prochlorperazine, rOPINIRole   Vital Signs    Vitals:   08/30/19 0600 08/30/19 0636 08/30/19 0700 08/30/19 0800  BP: (!) 104/43  (!) 111/62   Pulse: 69  70   Resp: (!) 33  16   Temp:    97.9 F (36.6 C)  TempSrc:    Oral  SpO2: 98%  96%   Weight:  79.9 kg    Height:        Intake/Output Summary (Last 24 hours) at 08/30/2019 1159 Last data filed at 08/30/2019 1000 Gross per 24 hour  Intake 500 ml  Output 4750 ml  Net -4250 ml   Last 3 Weights 08/30/2019 08/29/2019 08/27/2019  Weight (lbs) 176 lb 2.4 oz 175 lb 14.8 oz 185 lb 6.5 oz  Weight (kg) 79.9 kg 79.8 kg 84.1 kg      Telemetry    AV sequential pacing - Personally Reviewed  ECG    A paced, V sensed, RBBB+LAFB - Personally Reviewed  Physical Exam  Elderly, frail GEN: No acute distress.   Neck: No JVD Cardiac: RRR,  3/6 mid-peaking Ao ejection murmurno diastolic murmurs, rubs, or gallops.  Respiratory: Clear to auscultation bilaterally. GI: Soft, nontender, non-distended  MS: No edema; No deformity. Neuro:  Nonfocal  Psych: Normal affect   Labs    High Sensitivity Troponin:   Recent Labs  Lab 08/26/19 0435 08/26/19 0801 08/28/19 0917  TROPONINIHS 33* 47* 34*      Chemistry Recent Labs  Lab 08/27/19 0226 08/27/19 0226 08/28/19 0214 08/29/19 0233 08/30/19 0253  NA 139   < > 135 137 135  K 5.0   < > 4.3 3.7 3.1*  CL 103   < > 102 102 99  CO2 23   < > 24 24 27   GLUCOSE 133*   < > 117* 98 104*  BUN 25*   < > 18 16 15   CREATININE 0.96   < > 0.80 0.77 0.72  CALCIUM 8.5*   < > 8.4* 8.1* 7.7*  PROT 5.7*  --   --  6.1*  --   ALBUMIN 2.7*  --   --  2.6*  --   AST 20  --   --  33  --   ALT 12  --   --  18  --   ALKPHOS 53  --   --  55  --   BILITOT 0.8  --   --  0.7  --   GFRNONAA >60   < > >60 >60 >60  GFRAA >60   < > >60 >60 >60  ANIONGAP 13   < > 9 11 9    < > = values in this interval not displayed.     Hematology Recent Labs  Lab 08/28/19 0214 08/28/19 0214 08/28/19 2232 08/29/19 0233 08/30/19 0253  WBC 17.0*  --   --  10.8* 8.5  RBC 2.40*  --   --  3.05* 3.18*  HGB 7.5*   < > 10.0* 9.6* 9.8*  HCT 23.9*   < > 30.5* 29.2* 30.5*  MCV 99.6  --   --  95.7 95.9  MCH 31.3  --   --  31.5 30.8  MCHC 31.4  --   --  32.9 32.1  RDW 16.1*  --   --  17.1* 16.3*  PLT 229  --   --  206 244   < > = values in this interval not displayed.    BNPNo results for input(s): BNP, PROBNP in the last 168 hours.   DDimer No results for input(s): DDIMER in the last 168 hours.   Radiology    US PELVIS LIMITED (TRANSABDOMINAL ONLY)  Result Date: 08/29/2019 CLINICAL DATA:  Hematuria. EXAM: LIMITED ULTRASOUND OF PELVIS TECHNIQUE: Limited transabdominal ultrasound examination of the pelvis was performed. COMPARISON:  None. FINDINGS: Examination of the pelvis performed for evaluation of the  bladder due to persistent clot/hematuria. Foley catheter is present within the bladder as the bladder is empty and therefore difficult to evaluate. No other focal abnormality identified. IMPRESSION: Foley catheter present within a decompressed bladder. No focal abnormality visualized. Electronically Signed   By: Marin Olp M.D.   On: 08/29/2019 09:48    Cardiac Studies   2D Echo 08/26/19 1. Left ventricular ejection fraction, by estimation, is 40 to 45%. The  left ventricle has mildly decreased function. The left ventricle  demonstrates regional wall motion abnormalities (see scoring  diagram/findings for description). Basal to mid  inferior/inferolateral akinesis. There is severe asymmetric left  ventricular hypertrophy of the basal-septal segment. Left ventricular  diastolic parameters are indeterminate.  2. Right ventricular systolic function is mildly reduced. The right  ventricular size is mildly enlarged. Tricuspid regurgitation signal is  inadequate for assessing PA pressure.  3. The mitral valve is abnormal. Moderately calcified leaflets. No  evidence of mitral valve regurgitation.  4. Aortic dilatation noted. There is mild dilatation of the ascending  aorta measuring 38 mm.  5. The aortic valve is abnormal. Severely calcified. Aortic valve  regurgitation is mild. Severe aortic valve stenosis. Vmax 3.7 m/s, MG 35  mmHg, AVA 0.5 cm^2, DI 0.13.   Patient Profile     86 y.o. 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), severe orthostatic hypotension that is felt to be  neurogenic in etiology, arthritis, HLD, now progressed to severe AS by echo, urinary retention s/p catheter, admitted with hematuria/urinary bladder hematoma.  Assessment & Plan    1. Hematuria: resolved. Hold antiplatelets. Resume clopidogrel only in several weeks. 2. CHF: appears compensated - has some upper extremity swelling probably due to IV. 3. CAD: had angina before transfusion, none since. LDL at target on statin. 3. VT:  None documented recently. On amiodarone. 4. Severe orthostatic hypotension: fair symptomatic control on midodrine. 5. AS: severe, gradients underestimated due to lower EF. This could also explain his angina during anemia.Not a surgical candidate and not a candidate for TAVR either with recent bleeding events and overall very poor functional status. Will need to discuss palliative care, but will wait until his family is present. 6.  PM: normal device function. "Atrially dependent", only RV paces 27%.     For questions or updates, please contact [place] Please consult [email or web address] for contact info under        Signed, [name], MD  08/30/2019, 11:59 AM

## 2019-08-30 NOTE — Progress Notes (Addendum)
PROGRESS NOTE    HARL WIECHMANN  SFK:[id number] DOB: [birth year] DOA: 08/25/2019   PCP: [name], MD   Brief Narrative:  Mr. Adam [name] is a  86 year old male with past medical history of CAD s/p CABG in 1996 with extensive scarring of inferior wall MI, ischemic cardiomyopathy with chronic combined systolic and diastolic heart failure, sustained symptomatic VT on amiodarone, ischemic stroke, sinus node dysfunction with dual-chamber permanent pacemaker, severe orthostatic hypotension thought to be neurogenic and on midodrine, arthritis, hyperlipidemia, moderate AS, chronic urinary retention with indwelling Foley and previousTURP, previous hematuria and penile pain with multiple recent ED visits for penile and abdominal pain with blood in Foley catheter(7/10, 7/21-DC'd from ED)but returned the next day on 7/22 for the same.  In ED had an episode of V. tach episode- was placed on amiodarone drip in ED, developed hypotension, pacemaker was interrogated-amiodarone was DC'd, cardiology consulted, states patient is having sinus tachycardia with pacing, switch to oral amiodarone.  Urology consulted for hematuria recommended CBI,   Hemoglobin dropped, received 3 units of PRBC. Hb improved 9.6 and remained stable.  Hematuria resolved.  Needs outpatient urology follow-up for cystoscopy. Due to history of Pseudomonas and MRSA UTIs , he was started on vancomycin and cefepime for concern of sepsis, Blood and urine cultures negative at this time.  Plan to switch to oral antibiotics.   Assessment & Plan:   Active Problems:   Cardiomyopathy, ischemic   Pacemaker   Orthostatic hypotension   Hematuria   Non-sustained ventricular tachycardia (HCC)   Hypotension   SIRS (systemic inflammatory response syndrome) (HCC)  # Recurrent hematuria with bladder hematoma and Chronic indwelling Foley for chronic urinary retention- Improving. - CT renal stone study: Large bladder hematoma with possible hemorrhagic  cystitis underlying mass not excluded. - Urology on board:not a surgical candidate, Hematuria Improving, Discontinue CBI.  - Hemoglobin dropped 12.9 >> 7.5 , received 3 PRBC  , Hb  Improved 9.6 --- monitoring closely - Discussed with patient and family about blood transfusion , they agreed with blood transfusion. - BP improved with transfusion. -Please contact urology prior to any catheter change.  -Needs outpatient urology follow-up for cystoscopy after discharge  # Acute blood loss anemia due to hematuria ->>> Resolved. - Hemoglobin dropped from 12.9  >>> 8.0 - continue to monitor  H&H - S/P 3 PRBC, Hb remains 9.6, keep Hb above 8.0  # Tachycardia with history of VT and dual-chamber pacemaker. - She was started on amiodarone drip for concern of VT - Per cardiology and device interrogation, patient did not experience VT but instead was sinus tachycardia with V pacing response and pacemaker mediated tachycardia. - Change back to home p.o. amiodarone. -Goal K>4.0, goal Mg >2.0  #   Hypotension with history of  Chronic hypotension thought to be neurogenic - Improved. -   Received several IV fluid boluses. -   Continue midodrine. -   Recheck BP in both Upper extremities.  # SIRS concern for sepsis. - Leukocytosis (17.2 >>27.5)with tachypnea and hypotension as above . - Lactic acid 2.1 - Hemodynamically stable, mental status at baseline. - Has a history of Pseudomonas and MRSA UTIs, concern for possible urinary source/bacterial translocation. - Continue vancomycin and cefepime as empiric coverage. -  F/U Blood cultures x2 >> No growth so far -  F/U  Urine culture >> No growth so far - will change to empiric p.o. antibiotics.  # Acute encephalopathy, suspect metabolic, AKIand hypotension induced ->> Improved. CT brain --reviewed,  within normal limits. Correct electrolyte abnormalities Hypotension - Improved. SLP eval  # AKI:  ->>>> Resolved. Likely from hemodynamic  changes and urinary retention. Correct underlying issues above. Creatinine 1.53 >> 0.96  7. CAD s/p CABG slightly elevated troponin unlikely ACS. Aspirin on hold due to hematuria, Continue statin. Cardiology on board, recommended palliative care discussion.  8. Chronic combined systolic and diastolic heart failure. Bilateral lower extremity edema on exam. Torsemide was on hold due to hypotension and AKI,resumed yesterday as BP and creatinine improved but stopped again as BP dropped again. I/O and daily weight. Cautious iv hydration.  9. Moderate AS. Follow-up echo --pending Remain asymptomatic  10. Diabetes HA1C. Sensitive sliding scale   DVT prophylaxis: SCDs/ Compression stockings Code Status: Partial code. Family Communication:  Wife and daughter was present at bed side., Disposition Plan:  The above findings and plan of care has been discussed with patient (and family )  in detail,  they expressed understanding and agreement of above.  Dispo: The patient is from: Home  Anticipated d/c is to: Home  Anticipated d/c date is: > 3 days  Patient currently is not medically stable to d/c.  Hemodynamically unstable, continue needing intervention, IV fluids, bladder irrigation, IV antibiotics.    Consultants:   Urology, cardiology  Procedures: Antimicrobials: Anti-infectives (From admission, onward)   Start     Dose/Rate Route Frequency Ordered Stop   08/28/19 1400  ceFEPIme (MAXIPIME) 2 g in sodium chloride 0.9 % 100 mL IVPB     Discontinue     2 g 200 mL/hr over 30 Minutes Intravenous Every 8 hours 08/28/19 1330     08/28/19 1000  vancomycin (VANCOREADY) IVPB 1500 mg/300 mL     Discontinue     1,500 mg 150 mL/hr over 120 Minutes Intravenous Every 24 hours 08/27/19 1511     08/27/19 1300  vancomycin (VANCOCIN) IVPB 1000 mg/200 mL premix  Status:  Discontinued        1,000 mg 200 mL/hr over 60 Minutes Intravenous Every 24  hours 08/26/19 1055 08/27/19 1511   08/27/19 0600  cefTRIAXone (ROCEPHIN) 1 g in sodium chloride 0.9 % 100 mL IVPB  Status:  Discontinued        1 g 200 mL/hr over 30 Minutes Intravenous Every 24 hours 08/26/19 1012 08/26/19 1027   08/26/19 2300  ceFEPIme (MAXIPIME) 2 g in sodium chloride 0.9 % 100 mL IVPB  Status:  Discontinued        2 g 200 mL/hr over 30 Minutes Intravenous Every 12 hours 08/26/19 1055 08/28/19 1330   08/26/19 1130  vancomycin (VANCOREADY) IVPB 1750 mg/350 mL        1,750 mg 175 mL/hr over 120 Minutes Intravenous  Once 08/26/19 1055 08/26/19 1629   08/26/19 1130  ceFEPIme (MAXIPIME) 2 g in sodium chloride 0.9 % 100 mL IVPB        2 g 200 mL/hr over 30 Minutes Intravenous  Once 08/26/19 1055 08/26/19 1318   08/26/19 0500  cefTRIAXone (ROCEPHIN) 1 g in sodium chloride 0.9 % 100 mL IVPB        1 g 200 mL/hr over 30 Minutes Intravenous  Once 08/26/19 0454 08/26/19 0535      Subjective: Patient was seen and examined at bed side  , awake,  Alert,  Oriented x 3.  Not in acute distress,  No overnight issues. C/o: generalized weakness.  Arm swelling has significantly improved. Wife present at bedside.... Plan of care was discussed in detail.  Objective: Vitals:   08/30/19 0636 08/30/19 0700 08/30/19 0800 08/30/19 1200  BP:  (!) 111/62    Pulse:  70    Resp:  16    Temp:   97.9 F (36.6 C) 97.9 F (36.6 C)  TempSrc:   Oral Oral  SpO2:  96%    Weight: 79.9 kg     Height:        Intake/Output Summary (Last 24 hours) at 08/30/2019 1248 Last data filed at 08/30/2019 1000 Gross per 24 hour  Intake 500 ml  Output 4750 ml  Net -4250 ml   Filed Weights   08/27/19 0452 08/29/19 0500 08/30/19 0636  Weight: 84.1 kg 79.8 kg 79.9 kg    Examination:  General exam: Appears calm and comfortable.  Respiratory system: Clear to auscultation. Respiratory effort normal. Cardiovascular system: S1 & S2 heard, RRR. No JVD, rubs, gallops or clicks.  2+ pedal  edema. Gastrointestinal system: Abdomen is nondistended, soft and nontender. No organomegaly or masses felt. Normal bowel sounds heard. Central nervous system: Alert and oriented. No focal neurological deficits. Extremities: Pedal edema 2+, No rash. Bilateral arm swelling noted. Skin: No rashes, lesions or ulcers Psychiatry: Judgement and insight appear normal. Mood & affect appropriate.  Foley catheter in place.   Data Reviewed: I have personally reviewed following labs and imaging studies  CBC: Recent Labs  Lab 08/26/19 0111 08/26/19 0127 08/27/19 0226 08/27/19 0226 08/27/19 1942 08/28/19 0214 08/28/19 2232 08/29/19 0233 08/30/19 0253  WBC 17.2*  --  27.5*  --   --  17.0*  --  10.8* 8.5  NEUTROABS 13.2*  --   --   --   --   --   --   --   --   HGB 12.4*   < > 8.0*   < > 7.9* 7.5* 10.0* 9.6* 9.8*  HCT 37.2*   < > 24.9*   < > 24.8* 23.9* 30.5* 29.2* 30.5*  MCV 99.2  --  101.2*  --   --  99.6  --  95.7 95.9  PLT 303  --  247  --   --  229  --  206 244   < > = values in this interval not displayed.   Basic Metabolic Panel: Recent Labs  Lab 08/26/19 0127 08/26/19 0435 08/26/19 0801 08/27/19 0226 08/28/19 0214 08/29/19 0233 08/30/19 0253  NA   < >  --  139 139 135 137 135  K   < >  --  3.5 5.0 4.3 3.7 3.1*  CL   < >  --  102 103 102 102 99  CO2  --   --  16* 23 24 24 27   GLUCOSE   < >  --  204* 133* 117* 98 104*  BUN   < >  --  23 25* 18 16 15   CREATININE   < >  --  1.53* 0.96 0.80 0.77 0.72  CALCIUM  --   --  8.6* 8.5* 8.4* 8.1* 7.7*  MG  --  3.1*  --  2.6*  --  2.7*  --   PHOS  --  4.8*  --   --   --  2.1* 3.1   < > = values in this interval not displayed.   GFR: Estimated Creatinine Clearance: 63.4 mL/min (by C-G formula based on SCr of 0.72 mg/dL). Liver Function Tests: Recent Labs  Lab 08/27/19 0226 08/29/19 0233  AST 20 33  ALT 12 18  ALKPHOS 53  55  BILITOT 0.8 0.7  PROT 5.7* 6.1*  ALBUMIN 2.7* 2.6*   No results for input(s): LIPASE, AMYLASE in the  last 168 hours. No results for input(s): AMMONIA in the last 168 hours. Coagulation Profile: Recent Labs  Lab 08/26/19 0111  INR 1.0   Cardiac Enzymes: No results for input(s): CKTOTAL, CKMB, CKMBINDEX, TROPONINI in the last 168 hours. BNP (last 3 results) No results for input(s): PROBNP in the last 8760 hours. HbA1C: No results for input(s): HGBA1C in the last 72 hours. CBG: Recent Labs  Lab 08/29/19 1150 08/29/19 1643 08/29/19 2215 08/30/19 0756 08/30/19 1151  GLUCAP 97 129* 102* 100* 115*   Lipid Profile: No results for input(s): CHOL, HDL, LDLCALC, TRIG, CHOLHDL, LDLDIRECT in the last 72 hours. Thyroid Function Tests: No results for input(s): TSH, T4TOTAL, FREET4, T3FREE, THYROIDAB in the last 72 hours. Anemia Panel: No results for input(s): VITAMINB12, FOLATE, FERRITIN, TIBC, IRON, RETICCTPCT in the last 72 hours. Sepsis Labs: Recent Labs  Lab 08/27/19 0752 08/27/19 1106  LATICACIDVEN 2.1* 4.1*    Recent Results (from the past 240 hour(s))  Culture, Urine     Status: Abnormal   Collection Time: 08/26/19  2:16 AM   Specimen: Urine, Random  Result Value Ref Range Status   Specimen Description   Final    URINE, RANDOM Performed at [facility] [address], [place], [place] [address]    Special Requests   Final    NONE Performed at [facility], [place] [address], [place], [place] [address]    Culture MULTIPLE SPECIES PRESENT, SUGGEST RECOLLECTION (A)  Final   Report Status 08/27/2019 FINAL  Final  SARS Coronavirus 2 by RT PCR (hospital order, performed in [facility] hospital lab) Nasopharyngeal Nasopharyngeal Swab     Status: None   Collection Time: 08/26/19  3:12 AM   Specimen: Nasopharyngeal Swab  Result Value Ref Range Status   SARS Coronavirus 2 NEGATIVE NEGATIVE Final    Comment: (NOTE) SARS-CoV-2 target nucleic acids are NOT DETECTED.  The SARS-CoV-2 RNA is generally detectable in upper and lower respiratory  specimens during the acute phase of infection. The lowest concentration of SARS-CoV-2 viral copies this assay can detect is 250 copies / mL. A negative result does not preclude SARS-CoV-2 infection and should not be used as the sole basis for treatment or other patient management decisions.  A negative result may occur with improper specimen collection / handling, submission of specimen other than nasopharyngeal swab, presence of viral mutation(s) within the areas targeted by this assay, and inadequate number of viral copies (<250 copies / mL). A negative result must be combined with clinical observations, patient history, and epidemiological information.  Fact Sheet for Patients:   StrictlyIdeas.no  Fact Sheet for Healthcare Providers: BankingDealers.co.za  This test is not yet approved or  cleared by the [place] FDA and has been authorized for detection and/or diagnosis of SARS-CoV-2 by FDA under an Emergency Use Authorization (EUA).  This EUA will remain in effect (meaning this test can be used) for the duration of the COVID-19 declaration under Section 564(b)(1) of the Act, 21 U.S.C. section 360bbb-3(b)(1), unless the authorization is terminated or revoked sooner.  Performed at [facility], [place] [address] [place], [place] [address]   MRSA PCR Screening     Status: Abnormal   Collection Time: 08/26/19  6:45 AM   Specimen: Nasopharyngeal  Result Value Ref Range Status   MRSA by PCR POSITIVE (A)  NEGATIVE Final    Comment:        The GeneXpert MRSA Assay (FDA approved for NASAL specimens only), is one component of a comprehensive MRSA colonization surveillance program. It is not intended to diagnose MRSA infection nor to guide or monitor treatment for MRSA infections. RESULT CALLED TO, READ BACK BY AND VERIFIED WITH: CARTER,K @ 3154 MG [id number] BY POTEAT,S Performed at [facility],  [place] [address], [place], [place] [address]   Culture, blood (routine x 2)     Status: None (Preliminary result)   Collection Time: 08/26/19 10:58 AM   Specimen: BLOOD RIGHT HAND  Result Value Ref Range Status   Specimen Description   Final    BLOOD RIGHT HAND Performed at [facility] [address] [place], [place] [address]    Special Requests   Final    BOTTLES DRAWN AEROBIC ONLY Blood Culture results may not be optimal due to an inadequate volume of blood received in culture bottles Performed at [place] [address] [place], [place] [address]    Culture   Final    NO GROWTH 4 DAYS Performed at [facility], [place] [address] [place], [place] [address]    Report Status PENDING  Incomplete  Culture, blood (routine x 2)     Status: None (Preliminary result)   Collection Time: 08/26/19 10:58 AM   Specimen: BLOOD LEFT HAND  Result Value Ref Range Status   Specimen Description   Final    BLOOD LEFT HAND Performed at [place] [address] [place], [place] [address]    Special Requests   Final    BOTTLES DRAWN AEROBIC AND ANAEROBIC Blood Culture adequate volume Performed at [place] [address], [place], [place] [address]    Culture   Final    NO GROWTH 4 DAYS Performed at [facility], [place] [address] [place], [place] [address]    Report Status PENDING  Incomplete     Radiology Studies: US PELVIS LIMITED (TRANSABDOMINAL ONLY)  Result Date: 08/29/2019 CLINICAL DATA:  Hematuria. EXAM: LIMITED ULTRASOUND OF PELVIS TECHNIQUE: Limited transabdominal ultrasound examination of the pelvis was performed. COMPARISON:  None. FINDINGS: Examination of the pelvis performed for evaluation of the bladder due to persistent clot/hematuria. Foley catheter is present within the bladder as the bladder is empty and therefore difficult to evaluate. No other focal abnormality identified.  IMPRESSION: Foley catheter present within a decompressed bladder. No focal abnormality visualized. Electronically Signed   By: Marin Olp M.D.   On: 08/29/2019 09:48    Scheduled Meds: . acidophilus  1 capsule Oral TID  . amiodarone  100 mg Oral Daily  . Chlorhexidine Gluconate Cloth  6 each Topical Daily  . Droxidopa  100 mg Oral TID WC  . feeding supplement (ENSURE ENLIVE)  237 mL Oral BID BM  . insulin aspart  0-9 Units Subcutaneous TID WC  . levothyroxine  100 mcg Oral QAC breakfast  . mouth rinse  15 mL Mouth Rinse BID  . midodrine  5 mg Oral TID WC  . mupirocin ointment  1 application Nasal BID  . nystatin  5 mL Oral QID  . oxybutynin  10 mg Oral QHS  . phosphorus  250 mg Oral BID  . Plecanatide  3 mg Oral Daily  . pravastatin  40 mg Oral QHS  . rOPINIRole  0.5 mg Oral BID  . sodium chloride flush  3 mL Intravenous Q12H   Continuous Infusions: . ceFEPime (MAXIPIME) IV 2 g (08/30/19 8921)  . sodium chloride irrigation    . vancomycin 1,500 mg (08/30/19 1000)     LOS: 4 days    Time spent:  35 mins.  Shawna Clamp, MD [facility]   If 7PM-7AM, please contact night-coverage

## 2019-08-30 NOTE — TOC Progression Note (Signed)
Transition of Care [facility]) - Progression Note    Patient Details  Name: Adam [name] MRN: [id number] Date of Birth: 1926/04/04  Transition of Care [facility]) CM/SW Contact  Leeroy Cha, RN Phone Number: 08/30/2019, 8:23 AM  Clinical Narrative:    From [facility]. Iv maxipime, iv vanco ready. Will return to [facility].  Expected Discharge Plan: [place] Barriers to Discharge: Continued Medical Work up  Expected Discharge Plan and Services Expected Discharge Plan: [facility]   Discharge Planning Services: CM Consult Post Acute Care Choice: [place] Living arrangements for the past 2 months: [place]                                       Social Determinants of Health (SDOH) Interventions    Readmission Risk Interventions No flowsheet data found.

## 2019-08-31 DIAGNOSIS — I255 Ischemic cardiomyopathy: Secondary | ICD-10-CM | POA: Diagnosis not present

## 2019-08-31 DIAGNOSIS — G903 Multi-system degeneration of the autonomic nervous system: Secondary | ICD-10-CM | POA: Diagnosis not present

## 2019-08-31 LAB — CBC
HCT: 32.1 % — ABNORMAL LOW (ref 39.0–52.0)
Hemoglobin: 10.3 g/dL — ABNORMAL LOW (ref 13.0–17.0)
MCH: 31.6 pg (ref 26.0–34.0)
MCHC: 32.1 g/dL (ref 30.0–36.0)
MCV: 98.5 fL (ref 80.0–100.0)
Platelets: 218 10*3/uL (ref 150–400)
RBC: 3.26 MIL/uL — ABNORMAL LOW (ref 4.22–5.81)
RDW: 16.2 % — ABNORMAL HIGH (ref 11.5–15.5)
WBC: 8.9 10*3/uL (ref 4.0–10.5)
nRBC: 0 % (ref 0.0–0.2)

## 2019-08-31 LAB — BASIC METABOLIC PANEL
Anion gap: 10 (ref 5–15)
BUN: 13 mg/dL (ref 8–23)
CO2: 25 mmol/L (ref 22–32)
Calcium: 8.4 mg/dL — ABNORMAL LOW (ref 8.9–10.3)
Chloride: 102 mmol/L (ref 98–111)
Creatinine, Ser: 0.65 mg/dL (ref 0.61–1.24)
GFR calc Af Amer: >60 mL/min (ref >60)
GFR calc non Af Amer: >60 mL/min (ref >60)
Glucose, Bld: 114 mg/dL — ABNORMAL HIGH (ref 70–99)
Potassium: 3.6 mmol/L (ref 3.5–5.1)
Sodium: 137 mmol/L (ref 135–145)

## 2019-08-31 LAB — CULTURE, BLOOD (ROUTINE X 2)
Culture: NO GROWTH
Culture: NO GROWTH
Special Requests: ADEQUATE

## 2019-08-31 LAB — GLUCOSE, CAPILLARY
Glucose-Capillary: 94 mg/dL (ref 70–99)
Glucose-Capillary: 152 mg/dL — ABNORMAL HIGH (ref 70–99)
Glucose-Capillary: 115 mg/dL — ABNORMAL HIGH (ref 70–99)
Glucose-Capillary: 155 mg/dL — ABNORMAL HIGH (ref 70–99)

## 2019-08-31 LAB — MAGNESIUM: Magnesium: 2.3 mg/dL (ref 1.7–2.4)

## 2019-08-31 LAB — PHOSPHORUS: Phosphorus: 3 mg/dL (ref 2.5–4.6)

## 2019-08-31 MED ORDER — POTASSIUM CHLORIDE CRYS ER 20 MEQ PO TBCR
40.0000 meq | EXTENDED_RELEASE_TABLET | Freq: Once | ORAL | Status: AC
  Administered 2019-08-31: 40 meq via ORAL
  Filled 2019-08-31: qty 2

## 2019-08-31 MED ORDER — POTASSIUM CHLORIDE CRYS ER 10 MEQ PO TBCR: 10.0000 meq | EXTENDED_RELEASE_TABLET | Freq: Every day | ORAL | Status: DC

## 2019-08-31 MED ORDER — TORSEMIDE 20 MG PO TABS
20.0000 mg | ORAL_TABLET | Freq: Every day | ORAL | Status: DC
  Administered 2019-08-31 – 2019-09-03 (×4): 20 mg via ORAL
  Filled 2019-08-31 (×4): qty 1

## 2019-08-31 NOTE — Progress Notes (Signed)
PROGRESS NOTE    Adam [name]  JXB:[id number] DOB: [birth year] DOA: 08/25/2019   PCP: [name], MD   Brief Narrative:  Adam [name] is a  86 year old male with past medical history of CAD s/p CABG in 1996 with extensive scarring of inferior wall MI, ischemic cardiomyopathy with chronic combined systolic and diastolic heart failure, sustained symptomatic VT on amiodarone, ischemic stroke, sinus node dysfunction with dual-chamber permanent pacemaker, severe orthostatic hypotension thought to be neurogenic and on midodrine, arthritis, hyperlipidemia, moderate AS, chronic urinary retention with indwelling Foley and previousTURP, previous hematuria and penile pain with multiple recent ED visits for penile and abdominal pain with blood in Foley catheter(7/10, 7/21-DC'd from ED)but returned the next day on 7/22 for the same.  In ED had an episode of V. tach episode- was placed on amiodarone drip in ED, developed hypotension, pacemaker was interrogated-amiodarone was DC'd, cardiology consulted, states patient is having sinus tachycardia with pacing, switch to oral amiodarone.  Urology consulted for hematuria recommended CBI,   Hemoglobin dropped, received 3 units of PRBC. Hb improved 9.6 and remained stable.  Hematuria resolved.  Needs outpatient urology follow-up for cystoscopy.  Hemoglobin remained stable. Due to history of Pseudomonas and MRSA UTIs , he was started on vancomycin and cefepime for concern of sepsis, Blood and urine cultures negative at this time.  Antibiotics switched to Keflex for next 10 days. Given severe aortic stenosis cardiology recommended palliative care consult to discuss goals of care.   Assessment & Plan:   Active Problems:   Cardiomyopathy, ischemic   Pacemaker   Orthostatic hypotension   Hematuria   Non-sustained ventricular tachycardia (HCC)   Hypotension   SIRS (systemic inflammatory response syndrome) (HCC)  # Recurrent hematuria with bladder hematoma  and Chronic indwelling Foley for chronic urinary retention- Improving. - CT renal stone study: Large bladder hematoma with possible hemorrhagic cystitis underlying mass not excluded. - Urology on board:not a surgical candidate, Hematuria resolved, Discontinue CBI.  - Hemoglobin dropped 12.9 >> 7.5 , received 3 PRBC  , Hb  Improved 9.6 --- monitoring closely - BP improved with transfusion. - Please contact urology prior to any catheter change.  -Needs outpatient urology follow-up for cystoscopy after discharge  # Acute blood loss anemia due to hematuria ->>> Resolved. - Hemoglobin dropped from 12.9  >>> 8.0 - S/P 3 PRBC, Hb remains 9.6, keep Hb above 8.0. -Hemoglobin remained stable.  # Tachycardia with history of VT and dual-chamber pacemaker ->>> Resolved. - She was started on amiodarone drip for concern of VT - Per cardiology and device interrogation, patient did not experience VT but instead was sinus tachycardia with V pacing response and pacemaker mediated tachycardia. - Change back to home p.o. amiodarone. -Goal K>4.0, goal Mg >2.0  #   Hypotension with history of  Chronic hypotension thought to be neurogenic - Improved. -   Received several IV fluid boluses. -   Continue midodrine. -   Recheck BP in both Upper extremities.  # SIRS concern for sepsis. - Leukocytosis (17.2 >>27.5)with tachypnea and hypotension as above . - Lactic acid 2.1 - Hemodynamically stable, mental status at baseline. - Has a history of Pseudomonas and MRSA UTIs, concern for possible urinary source/bacterial translocation. - Continue vancomycin and cefepime as empiric coverage. -  F/U Blood cultures x2 >> No growth so far -  F/U  Urine culture >> No growth so far - will change to p.o. Keflex ( total  10 days course)  # Acute encephalopathy,  suspect metabolic, AKIand hypotension induced ->> Improved. CT brain --reviewed, within normal limits. Correct electrolyte abnormalities Hypotension -  Improved. SLP eval  # AKI:  ->>>> Resolved. Likely from hemodynamic changes and urinary retention. Correct underlying issues above. Creatinine 1.53 >> 0.96  7. CAD s/p CABG slightly elevated troponin unlikely ACS. Aspirin on hold due to hematuria, Continue statin. Cardiology on board, recommended palliative care discussion.  8. Chronic combined systolic and diastolic heart failure. Bilateral lower extremity edema on exam. Torsemide was on hold due to hypotension and AKI,resumed yesterday as BP and creatinine improved. I/O and daily weight. Cautious iv hydration.  9. Moderate AS. Follow-up echo --pending Remain asymptomatic  10. Diabetes HA1C. Sensitive sliding scale   DVT prophylaxis: SCDs/ Compression stockings Code Status: Partial code. Family Communication:  Wife and daughter was present at bed side., Disposition Plan:  The above findings and plan of care has been discussed with patient (and family )  in detail,  they expressed understanding and agreement of above.  Dispo: The patient is from: Home  Anticipated d/c is to: Home  Anticipated d/c date is: > 3 days  Patient currently is not medically stable to d/c.  Hemodynamically unstable, continue needing intervention, Very deconditioned, needs PT.   Consultants:   Urology, cardiology  Procedures: Antimicrobials: Anti-infectives (From admission, onward)   Start     Dose/Rate Route Frequency Ordered Stop   08/31/19 0600  cephALEXin (KEFLEX) capsule 500 mg     Discontinue     500 mg Oral Every 8 hours 08/30/19 1316 09/05/19 0559   08/28/19 1400  ceFEPIme (MAXIPIME) 2 g in sodium chloride 0.9 % 100 mL IVPB        2 g 200 mL/hr over 30 Minutes Intravenous Every 8 hours 08/28/19 1330 08/30/19 2155   08/28/19 1000  vancomycin (VANCOREADY) IVPB 1500 mg/300 mL  Status:  Discontinued        1,500 mg 150 mL/hr over 120 Minutes Intravenous Every 24 hours 08/27/19 1511  08/30/19 1315   08/27/19 1300  vancomycin (VANCOCIN) IVPB 1000 mg/200 mL premix  Status:  Discontinued        1,000 mg 200 mL/hr over 60 Minutes Intravenous Every 24 hours 08/26/19 1055 08/27/19 1511   08/27/19 0600  cefTRIAXone (ROCEPHIN) 1 g in sodium chloride 0.9 % 100 mL IVPB  Status:  Discontinued        1 g 200 mL/hr over 30 Minutes Intravenous Every 24 hours 08/26/19 1012 08/26/19 1027   08/26/19 2300  ceFEPIme (MAXIPIME) 2 g in sodium chloride 0.9 % 100 mL IVPB  Status:  Discontinued        2 g 200 mL/hr over 30 Minutes Intravenous Every 12 hours 08/26/19 1055 08/28/19 1330   08/26/19 1130  vancomycin (VANCOREADY) IVPB 1750 mg/350 mL        1,750 mg 175 mL/hr over 120 Minutes Intravenous  Once 08/26/19 1055 08/26/19 1629   08/26/19 1130  ceFEPIme (MAXIPIME) 2 g in sodium chloride 0.9 % 100 mL IVPB        2 g 200 mL/hr over 30 Minutes Intravenous  Once 08/26/19 1055 08/26/19 1318   08/26/19 0500  cefTRIAXone (ROCEPHIN) 1 g in sodium chloride 0.9 % 100 mL IVPB        1 g 200 mL/hr over 30 Minutes Intravenous  Once 08/26/19 0454 08/26/19 0535      Subjective: Patient was seen and examined at bed side  , awake,  Alert,  Oriented x 3.  Not in acute distress,  No overnight issues. C/o: generalized weakness.  Arm swelling has significantly improved.  Hematuria has resolved, hemoglobin remained stable. Wife present at bedside.... Plan of care was discussed in detail.   Objective: Vitals:   08/31/19 0800 08/31/19 0900 08/31/19 1000 08/31/19 1100  BP: (!) 141/58 (!) 135/80 (!) 105/50 (!) 125/58  Pulse: 70 73 70 70  Resp: 21 22 (!) 25 23  Temp:      TempSrc:      SpO2: 99% 96% 97% 99%  Weight:      Height:        Intake/Output Summary (Last 24 hours) at 08/31/2019 1247 Last data filed at 08/31/2019 1042 Gross per 24 hour  Intake 882.54 ml  Output 950 ml  Net -67.46 ml   Filed Weights   08/29/19 0500 08/30/19 0636 08/31/19 0702  Weight: 79.8 kg 79.9 kg 80.1 kg     Examination:  General exam: Appears calm and comfortable.  Respiratory system: Clear to auscultation. Respiratory effort normal. Cardiovascular system: S1 & S2 heard, RRR. No JVD, rubs, gallops or clicks.  2+ pedal edema. Gastrointestinal system: Abdomen is nondistended, soft and nontender. No organomegaly or masses felt. Normal bowel sounds heard. Central nervous system: Alert and oriented. No focal neurological deficits. Extremities: Pedal edema 2+, No rash. Bilateral arm swelling improved. Skin: No rashes, lesions or ulcers Psychiatry: Judgement and insight appear normal. Mood & affect appropriate.  Foley catheter in place.   Data Reviewed: I have personally reviewed following labs and imaging studies  CBC: Recent Labs  Lab 08/26/19 0111 08/26/19 0127 08/27/19 0226 08/27/19 1942 08/28/19 0214 08/28/19 2232 08/29/19 0233 08/30/19 0253 08/31/19 0205  WBC 17.2*   < > 27.5*  --  17.0*  --  10.8* 8.5 8.9  NEUTROABS 13.2*  --   --   --   --   --   --   --   --   HGB 12.4*   < > 8.0*   < > 7.5* 10.0* 9.6* 9.8* 10.3*  HCT 37.2*   < > 24.9*   < > 23.9* 30.5* 29.2* 30.5* 32.1*  MCV 99.2   < > 101.2*  --  99.6  --  95.7 95.9 98.5  PLT 303   < > 247  --  229  --  206 244 218   < > = values in this interval not displayed.   Basic Metabolic Panel: Recent Labs  Lab 08/26/19 0435 08/26/19 0801 08/27/19 0226 08/28/19 0214 08/29/19 0233 08/30/19 0253 08/31/19 0205  NA  --    < > 139 135 137 135 137  K  --    < > 5.0 4.3 3.7 3.1* 3.6  CL  --    < > 103 102 102 99 102  CO2  --    < > 23 24 24 27 25   GLUCOSE  --    < > 133* 117* 98 104* 114*  BUN  --    < > 25* 18 16 15 13   CREATININE  --    < > 0.96 0.80 0.77 0.72 0.65  CALCIUM  --    < > 8.5* 8.4* 8.1* 7.7* 8.4*  MG 3.1*  --  2.6*  --  2.7*  --  2.3  PHOS 4.8*  --   --   --  2.1* 3.1 3.0   < > = values in this interval not displayed.   GFR: Estimated Creatinine Clearance: 63.4 mL/min (by C-G  formula based on SCr of  0.65 mg/dL). Liver Function Tests: Recent Labs  Lab 08/27/19 0226 08/29/19 0233  AST 20 33  ALT 12 18  ALKPHOS 53 55  BILITOT 0.8 0.7  PROT 5.7* 6.1*  ALBUMIN 2.7* 2.6*   No results for input(s): LIPASE, AMYLASE in the last 168 hours. No results for input(s): AMMONIA in the last 168 hours. Coagulation Profile: Recent Labs  Lab 08/26/19 0111  INR 1.0   Cardiac Enzymes: No results for input(s): CKTOTAL, CKMB, CKMBINDEX, TROPONINI in the last 168 hours. BNP (last 3 results) No results for input(s): PROBNP in the last 8760 hours. HbA1C: No results for input(s): HGBA1C in the last 72 hours. CBG: Recent Labs  Lab 08/30/19 1151 08/30/19 1614 08/30/19 2110 08/31/19 0745 08/31/19 1150  GLUCAP 115* 96 129* 94 152*   Lipid Profile: No results for input(s): CHOL, HDL, LDLCALC, TRIG, CHOLHDL, LDLDIRECT in the last 72 hours. Thyroid Function Tests: No results for input(s): TSH, T4TOTAL, FREET4, T3FREE, THYROIDAB in the last 72 hours. Anemia Panel: No results for input(s): VITAMINB12, FOLATE, FERRITIN, TIBC, IRON, RETICCTPCT in the last 72 hours. Sepsis Labs: Recent Labs  Lab 08/27/19 0752 08/27/19 1106  LATICACIDVEN 2.1* 4.1*    Recent Results (from the past 240 hour(s))  Culture, Urine     Status: Abnormal   Collection Time: 08/26/19  2:16 AM   Specimen: Urine, Random  Result Value Ref Range Status   Specimen Description   Final    URINE, RANDOM Performed at [facility] [address], [place], [place] [address]    Special Requests   Final    NONE Performed at [facility], [place] [address] [place], [place] [address]    Culture MULTIPLE SPECIES PRESENT, SUGGEST RECOLLECTION (A)  Final   Report Status 08/27/2019 FINAL  Final  SARS Coronavirus 2 by RT PCR (hospital order, performed in [facility] hospital lab) Nasopharyngeal Nasopharyngeal Swab     Status: None   Collection Time: 08/26/19  3:12 AM   Specimen: Nasopharyngeal  Swab  Result Value Ref Range Status   SARS Coronavirus 2 NEGATIVE NEGATIVE Final    Comment: (NOTE) SARS-CoV-2 target nucleic acids are NOT DETECTED.  The SARS-CoV-2 RNA is generally detectable in upper and lower respiratory specimens during the acute phase of infection. The lowest concentration of SARS-CoV-2 viral copies this assay can detect is 250 copies / mL. A negative result does not preclude SARS-CoV-2 infection and should not be used as the sole basis for treatment or other patient management decisions.  A negative result may occur with improper specimen collection / handling, submission of specimen other than nasopharyngeal swab, presence of viral mutation(s) within the areas targeted by this assay, and inadequate number of viral copies (<250 copies / mL). A negative result must be combined with clinical observations, patient history, and epidemiological information.  Fact Sheet for Patients:   StrictlyIdeas.no  Fact Sheet for Healthcare Providers: BankingDealers.co.za  This test is not yet approved or  cleared by the [place] FDA and has been authorized for detection and/or diagnosis of SARS-CoV-2 by FDA under an Emergency Use Authorization (EUA).  This EUA will remain in effect (meaning this test can be used) for the duration of the COVID-19 declaration under Section 564(b)(1) of the Act, 21 U.S.C. section 360bbb-3(b)(1), unless the authorization is terminated or revoked sooner.  Performed at [facility], [place] [address], [place], [place] [address]   MRSA PCR Screening  Status: Abnormal   Collection Time: 08/26/19  6:45 AM   Specimen: Nasopharyngeal  Result Value Ref Range Status   MRSA by PCR POSITIVE (A) NEGATIVE Final    Comment:        The GeneXpert MRSA Assay (FDA approved for NASAL specimens only), is one component of a comprehensive MRSA colonization surveillance program. It is  not intended to diagnose MRSA infection nor to guide or monitor treatment for MRSA infections. RESULT CALLED TO, READ BACK BY AND VERIFIED WITH: CARTER,K @ 1884 [id number] BY POTEAT,S Performed at [facility], [place] [address] [place], [place] [address]   Culture, blood (routine x 2)     Status: None (Preliminary result)   Collection Time: 08/26/19 10:58 AM   Specimen: BLOOD RIGHT HAND  Result Value Ref Range Status   Specimen Description   Final    BLOOD RIGHT HAND Performed at [facility] [address], [place], [place] [address]    Special Requests   Final    BOTTLES DRAWN AEROBIC ONLY Blood Culture results may not be optimal due to an inadequate volume of blood received in culture bottles Performed at [facility] [address] [place], [place] [address]    Culture   Final    NO GROWTH 4 DAYS Performed at [facility], [place] [address], [place], [place] [address]    Report Status PENDING  Incomplete  Culture, blood (routine x 2)     Status: None (Preliminary result)   Collection Time: 08/26/19 10:58 AM   Specimen: BLOOD LEFT HAND  Result Value Ref Range Status   Specimen Description   Final    BLOOD LEFT HAND Performed at [facility] [address] [place], [place] [address]    Special Requests   Final    BOTTLES DRAWN AEROBIC AND ANAEROBIC Blood Culture adequate volume Performed at [facility] [address] [place], [place] [address]    Culture   Final    NO GROWTH 4 DAYS Performed at [facility], [place] [address] [place], [place] [address]    Report Status PENDING  Incomplete     Radiology Studies: No results found.  Scheduled Meds: . acidophilus  1 capsule Oral TID  . amiodarone  100 mg Oral Daily  . cephALEXin  500 mg Oral Q8H  . Chlorhexidine Gluconate Cloth  6 each Topical Daily  . Droxidopa  100 mg Oral TID WC  . feeding supplement  (ENSURE ENLIVE)  237 mL Oral BID BM  . insulin aspart  0-9 Units Subcutaneous TID WC  . levothyroxine  100 mcg Oral QAC breakfast  . mouth rinse  15 mL Mouth Rinse BID  . midodrine  5 mg Oral TID WC  . nystatin  5 mL Oral QID  . oxybutynin  10 mg Oral QHS  . Plecanatide  3 mg Oral Daily  . [START ON 09/01/2019] potassium chloride  10 mEq Oral Daily  . pravastatin  40 mg Oral QHS  . rOPINIRole  0.5 mg Oral BID  . sodium chloride flush  3 mL Intravenous Q12H  . torsemide  20 mg Oral Daily   Continuous Infusions: . sodium chloride irrigation       LOS: 5 days    Time spent:  35 mins.  Shawna Clamp, MD [facility]   If 7PM-7AM, please contact night-coverage

## 2019-08-31 NOTE — Progress Notes (Signed)
Physical Therapy Treatment Patient Details Name: Adam [name] MRN: [id number] DOB: 1927-01-21 Today's Date: 08/31/2019    History of Present Illness Pt admitted with acute encephalopathy, hypotension and hematuria with bladder hematoma.  Pt wtih hx of CABG, pacemaker, MI, CAD, DM, cardiomyopathy, and orthostatic hypotension.  Pt also with chronic in-dwelling catheter for chronic urinary retention..    PT Comments    The  Patient assisted to sitting on bed edge x 10 minutes. Tolerated well. BP sitting 122/60. HR 80. Patient reports sitting up felt very good. Patient will require 2 max assist for transfers. Currently recommend lift equipment for OOB transfers.  Follow Up Recommendations  SNF     Equipment Recommendations  None recommended by PT    Recommendations for Other Services       Precautions / Restrictions Precautions Precautions: Fall Precaution Comments: BP soft    Mobility  Bed Mobility Overal bed mobility: Needs Assistance Bed Mobility: Supine to Sit;Sit to Supine     Supine to sit: Mod assist Sit to supine: Max assist   General bed mobility comments: Assist to manage LEs, to bring trunk to upright and to complete rotation to EOB sitting using bed pad, assist legs and trunk back into bed.  Transfers                 General transfer comment: NT-requires +2  Ambulation/Gait                 Stairs             Wheelchair Mobility    Modified Rankin (Stroke Patients Only)       Balance Overall balance assessment: Needs assistance Sitting-balance support: Bilateral upper extremity supported Sitting balance-Leahy Scale: Fair Sitting balance - Comments: sat on bed edge x10 ", supports self at midline                                    Cognition Arousal/Alertness: Awake/alert Behavior During Therapy: WFL for tasks assessed/performed Overall Cognitive Status: Within Functional Limits for tasks assessed                                         Exercises      General Comments        Pertinent Vitals/Pain Pain Assessment: Faces Faces Pain Scale: Hurts little more Pain Location: cather/scrotal area Pain Descriptors / Indicators: Discomfort Pain Intervention(s): Monitored during session;Repositioned    Home Living                      Prior Function            PT Goals (current goals can now be found in the care plan section) Progress towards PT goals: Progressing toward goals    Frequency    Min 2X/week      PT Plan Current plan remains appropriate    Co-evaluation              AM-PAC PT "6 Clicks" Mobility   Outcome Measure  Help needed turning from your back to your side while in a flat bed without using bedrails?: A Lot Help needed moving from lying on your back to sitting on the side of a flat bed without using bedrails?: Total Help needed moving to and from a  bed to a chair (including a wheelchair)?: Total Help needed standing up from a chair using your arms (e.g., wheelchair or bedside chair)?: Total Help needed to walk in hospital room?: Total Help needed climbing 3-5 steps with a railing? : Total 6 Click Score: 7    End of Session   Activity Tolerance: Patient tolerated treatment well Patient left: in bed;with call bell/phone within reach;with family/visitor present;with nursing/sitter in room Nurse Communication: Mobility status;Need for lift equipment PT Visit Diagnosis: Muscle weakness (generalized) (M62.81);Difficulty in walking, not elsewhere classified (R26.2)     Time: 0929-5747 PT Time Calculation (min) (ACUTE ONLY): 27 min  Charges:  $Therapeutic Activity: 23-37 mins                     [name] PT Acute Rehabilitation Services Pager [phone] Office [phone]    [name] 08/31/2019, 4:01 PM

## 2019-08-31 NOTE — Progress Notes (Addendum)
Progress Note  Patient Name: Adam [name] Date of Encounter: 08/31/2019  Primary Cardiologist: [name], MD  Subjective   No CP or SOB, feeling good.  Inpatient Medications    Scheduled Meds: . acidophilus  1 capsule Oral TID  . amiodarone  100 mg Oral Daily  . cephALEXin  500 mg Oral Q8H  . Chlorhexidine Gluconate Cloth  6 each Topical Daily  . Droxidopa  100 mg Oral TID WC  . feeding supplement (ENSURE ENLIVE)  237 mL Oral BID BM  . insulin aspart  0-9 Units Subcutaneous TID WC  . levothyroxine  100 mcg Oral QAC breakfast  . mouth rinse  15 mL Mouth Rinse BID  . midodrine  5 mg Oral TID WC  . nystatin  5 mL Oral QID  . oxybutynin  10 mg Oral QHS  . Plecanatide  3 mg Oral Daily  . pravastatin  40 mg Oral QHS  . rOPINIRole  0.5 mg Oral BID  . sodium chloride flush  3 mL Intravenous Q12H  . torsemide  20 mg Oral Daily   Continuous Infusions: . sodium chloride irrigation     PRN Meds: acetaminophen **OR** acetaminophen, methocarbamol, polyethylene glycol, prochlorperazine, rOPINIRole   Vital Signs    Vitals:   08/31/19 0500 08/31/19 0600 08/31/19 0700 08/31/19 0702  BP: (!) 137/64 118/67 (!) 120/64   Pulse: 70 73 75   Resp: 18 (!) 27 17   Temp:      TempSrc:      SpO2: 95% 90% 95%   Weight:    80.1 kg  Height:        Intake/Output Summary (Last 24 hours) at 08/31/2019 0826 Last data filed at 08/31/2019 0514 Gross per 24 hour  Intake 879.54 ml  Output 1150 ml  Net -270.46 ml   Last 3 Weights 08/31/2019 08/30/2019 08/29/2019  Weight (lbs) 176 lb 9.4 oz 176 lb 2.4 oz 175 lb 14.8 oz  Weight (kg) 80.1 kg 79.9 kg 79.8 kg     Telemetry    Atrial paced ventricular sensed - Personally Reviewed  Physical Exam   GEN: No acute distress, frail, elderly HEENT: Normocephalic, atraumatic, sclera non-icteric. Neck: No JVD or bruits. Cardiac: 2/6 SEM at RUSB, diminished S2, no rubs or gallops.  Radials/DP/PT 1+ and equal bilaterally.  Respiratory: Clear to  auscultation bilaterally. Breathing is unlabored. GI: Soft, nontender, non-distended, BS +x 4. MS: no deformity. Extremities: No clubbing or cyanosis. No edema. Distal pedal pulses are 2+ and equal bilaterally. Neuro:  AAOx3. Follows commands. Psych:  Responds to questions appropriately with a normal affect.  Labs    High Sensitivity Troponin:   Recent Labs  Lab 08/26/19 0435 08/26/19 0801 08/28/19 0917  TROPONINIHS 33* 47* 34*      Cardiac EnzymesNo results for input(s): TROPONINI in the last 168 hours. No results for input(s): TROPIPOC in the last 168 hours.   Chemistry Recent Labs  Lab 08/27/19 0226 08/28/19 0214 08/29/19 0233 08/30/19 0253 08/31/19 0205  NA 139   < > 137 135 137  K 5.0   < > 3.7 3.1* 3.6  CL 103   < > 102 99 102  CO2 23   < > 24 27 25   GLUCOSE 133*   < > 98 104* 114*  BUN 25*   < > 16 15 13   CREATININE 0.96   < > 0.77 0.72 0.65  CALCIUM 8.5*   < > 8.1* 7.7* 8.4*  PROT 5.7*  --  6.1*  --   --   ALBUMIN 2.7*  --  2.6*  --   --   AST 20  --  33  --   --   ALT 12  --  18  --   --   ALKPHOS 53  --  55  --   --   BILITOT 0.8  --  0.7  --   --   GFRNONAA >60   < > >60 >60 >60  GFRAA >60   < > >60 >60 >60  ANIONGAP 13   < > 11 9 10    < > = values in this interval not displayed.     Hematology Recent Labs  Lab 08/29/19 0233 08/30/19 0253 08/31/19 0205  WBC 10.8* 8.5 8.9  RBC 3.05* 3.18* 3.26*  HGB 9.6* 9.8* 10.3*  HCT 29.2* 30.5* 32.1*  MCV 95.7 95.9 98.5  MCH 31.5 30.8 31.6  MCHC 32.9 32.1 32.1  RDW 17.1* 16.3* 16.2*  PLT 206 244 218    BNPNo results for input(s): BNP, PROBNP in the last 168 hours.   DDimer No results for input(s): DDIMER in the last 168 hours.   Radiology    No results found.  Cardiac Studies   2D Echo 08/26/19 1. Left ventricular ejection fraction, by estimation, is 40 to 45%. The  left ventricle has mildly decreased function. The left ventricle  demonstrates regional wall motion abnormalities (see scoring   diagram/findings for description). Basal to mid  inferior/inferolateral akinesis. There is severe asymmetric left  ventricular hypertrophy of the basal-septal segment. Left ventricular  diastolic parameters are indeterminate.  2. Right ventricular systolic function is mildly reduced. The right  ventricular size is mildly enlarged. Tricuspid regurgitation signal is  inadequate for assessing PA pressure.  3. The mitral valve is abnormal. Moderately calcified leaflets. No  evidence of mitral valve regurgitation.  4. Aortic dilatation noted. There is mild dilatation of the ascending  aorta measuring 38 mm.  5. The aortic valve is abnormal. Severely calcified. Aortic valve  regurgitation is mild. Severe aortic valve stenosis. Vmax 3.7 m/s, MG 35  mmHg, AVA 0.5 cm^2, DI 0.13.   Patient Profile     86 y.o. 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 He was admitted with gross hematuria and large bladder hematoma requiring irrigation. Patient was thought to have episode of VT that did not require intervention, but pacemaker interrogation revealed this was v-paced rhythm with pacemaker-mediated tachycardia, NOT ventricular tachycardia. PPM settings adjusted. Had hypotension earlier this admission that improved with resumption of home midodrine, also some angina that occurred before blood transfusion for ABL anemia. 2D echo this admission shows  progression to severe aortic stenosis.  Assessment & Plan     1. Recurrent hematuria with bladder hematoma, hypotension, AKI, and development of ABL anemia this admission - antiplatelets on hold, per Dr. Sallyanne Kuster, resume clopidogrel only in several weeks - s/p blood transfusion this admission - per IM/urology  2. Tachycardia on admission -per device interrogation was NOT ventricular tachycardia but instead sinus tachycardia with ventricular pacing  response and pacemaker mediated tachycardia. Reassuring. Settings adjusted. Per notes, normal device function, atrial dependent, only RV paces 27%. - continue home amdioarone - keep eye on lytes  3. CAD s/p CABG remotely with minimally elevated troponin - troponin was low/flat, not felt to represent ACS at this time. - ASA on hold due to #1 - continue statin - not on BB at home in context of known orthostasis  4. Chronic combined CHF, with management complicated by severe orthostatic hypotension - LVEF actually improved slightly from prior at 40-45% (previously 35-40%) although now with severe AS. Not able to tolerate guideline directed management as OP due to h/o severe hypotension. I/O's will not be totally reliable given bladder irrigation counted in tallies. - fluid status looks OK although baseline weight has most recently been around 178lb (185 today) - upper extremity swelling felt possibly due to IV - now back on home torsemide today with normal BP  5. Severe AS by echo this admission - previously moderate, not surprisingly there has been progression. He is sedentary at baseline so challenging to assess degree of symptoms. - with current medical issues including anemia and hematuria requiring holding of antiplatelets, not presently a candidate for invasive management - Dr. Sallyanne Kuster plans to discuss palliative care when family present (he is well known to this patient and family)  6. Hypokalemia - K 3.1 yesterday -> 25meq KCl -> 3.6  today - add KCl 63meq today then 28meq daily while on torsemide - f/u BMET in AM  As previously mentioned, have tentatively arranged f/u 8/13, appt on AVS   For questions or updates, please contact [facility] Please consult [email or web address] for contact info under Cardiology/STEMI.  Signed, Charlie Pitter, PA-C 08/31/2019, 8:26 AM    I have seen and examined the patient along with Charlie Pitter, PA-C.  I have reviewed the chart, notes and new data.  I agree with PA/NP's note.  Key new complaints: Denies dyspnea, dizziness, angina or syncope.  Feels well.  No further hematuria. Key examination changes: No clinical signs of hypervolemia.  Late peaking systolic ejection murmur. Key new findings / data: Atrial paced, ventricular sensed rhythm on monitor.  No episodes of atrial fibrillation or ventricular tachycardia seen.  PLAN: We had a very lengthy discussion regarding the implications of severe aortic stenosis. Unfortunately, he is not a good candidate for either surgical aortic valve replacement or TAVR.  General frailty, poor mobility, numerous comorbid conditions and his recent problems with bleeding on antiplatelet agents are all signs of high likelihood of complications with either approach. Also reviewed the slow but unrelenting natural history of aortic stenosis.  I suspect if he was in so sedentary, he would have had symptoms for a long time now.  Recommended a new consultation with palliative care (they have already had interactions with the palliative care team at [facility]).  It is likely that he will need to progress to home hospice at some point in the next 12 months. I recommended DNR status, which he had previously requested on past hospitalizations (I am not sure what led to change during current hospital stay). Focus on treatments and interventions that provides symptom palliation.  Avoid aggressive interventions or medications that do not offer immediate symptom  benefit. The patient's wife of 41 years, Adam [name] and his daughter Adam [name] were present for this conversation.  They both understand the circumstances and agree with a conservative approach to Darcey's care.  Mr. Soulier was thankful for the openness with  which his clinical situation was presented.  I told him I would be back tomorrow to answer any additional questions.  [name], MD, [place] [phone] 08/31/2019, 11:47 AM

## 2019-08-31 NOTE — Progress Notes (Signed)
Patient's gold wedding band and watch handed to his wife, Oleta Mouse, to take home.

## 2019-09-01 DIAGNOSIS — R319 Hematuria, unspecified: Secondary | ICD-10-CM | POA: Diagnosis not present

## 2019-09-01 DIAGNOSIS — I5043 Acute on chronic combined systolic (congestive) and diastolic (congestive) heart failure: Secondary | ICD-10-CM

## 2019-09-01 LAB — CBC
HCT: 31.7 % — ABNORMAL LOW (ref 39.0–52.0)
Hemoglobin: 10.2 g/dL — ABNORMAL LOW (ref 13.0–17.0)
MCH: 31.4 pg (ref 26.0–34.0)
MCHC: 32.2 g/dL (ref 30.0–36.0)
MCV: 97.5 fL (ref 80.0–100.0)
Platelets: 250 10*3/uL (ref 150–400)
RBC: 3.25 MIL/uL — ABNORMAL LOW (ref 4.22–5.81)
RDW: 15.9 % — ABNORMAL HIGH (ref 11.5–15.5)
WBC: 8 10*3/uL (ref 4.0–10.5)
nRBC: 0 % (ref 0.0–0.2)

## 2019-09-01 LAB — GLUCOSE, CAPILLARY
Glucose-Capillary: 113 mg/dL — ABNORMAL HIGH (ref 70–99)
Glucose-Capillary: 152 mg/dL — ABNORMAL HIGH (ref 70–99)
Glucose-Capillary: 104 mg/dL — ABNORMAL HIGH (ref 70–99)
Glucose-Capillary: 127 mg/dL — ABNORMAL HIGH (ref 70–99)

## 2019-09-01 LAB — BASIC METABOLIC PANEL
Anion gap: 13 (ref 5–15)
BUN: 14 mg/dL (ref 8–23)
CO2: 26 mmol/L (ref 22–32)
Calcium: 8.4 mg/dL — ABNORMAL LOW (ref 8.9–10.3)
Chloride: 99 mmol/L (ref 98–111)
Creatinine, Ser: 0.65 mg/dL (ref 0.61–1.24)
GFR calc Af Amer: >60 mL/min (ref >60)
GFR calc non Af Amer: >60 mL/min (ref >60)
Glucose, Bld: 101 mg/dL — ABNORMAL HIGH (ref 70–99)
Potassium: 3.8 mmol/L (ref 3.5–5.1)
Sodium: 138 mmol/L (ref 135–145)

## 2019-09-01 MED ORDER — POTASSIUM CHLORIDE CRYS ER 10 MEQ PO TBCR
10.0000 meq | EXTENDED_RELEASE_TABLET | Freq: Once | ORAL | Status: AC
  Administered 2019-09-01: 10 meq via ORAL
  Filled 2019-09-01: qty 1

## 2019-09-01 MED ORDER — TORSEMIDE 20 MG PO TABS
20.0000 mg | ORAL_TABLET | Freq: Once | ORAL | Status: AC
  Administered 2019-09-01: 20 mg via ORAL
  Filled 2019-09-01: qty 1

## 2019-09-01 MED ORDER — POTASSIUM CHLORIDE CRYS ER 20 MEQ PO TBCR
20.0000 meq | EXTENDED_RELEASE_TABLET | Freq: Every day | ORAL | Status: DC
  Administered 2019-09-01 – 2019-09-03 (×3): 20 meq via ORAL
  Filled 2019-09-01 (×4): qty 1

## 2019-09-01 MED ORDER — SENNOSIDES-DOCUSATE SODIUM 8.6-50 MG PO TABS
1.0000 | ORAL_TABLET | Freq: Two times a day (BID) | ORAL | Status: DC
  Administered 2019-09-01 – 2019-09-02 (×4): 1 via ORAL
  Filled 2019-09-01 (×5): qty 1

## 2019-09-01 NOTE — Progress Notes (Signed)
PROGRESS NOTE    Adam [name]  DUK:[id number] DOB: [birth year] DOA: 08/25/2019   PCP: [name], MD   Brief Narrative:  Mr. Adam [name] is a  86 year old male with past medical history of CAD s/p CABG in 1996 with extensive scarring of inferior wall MI, ischemic cardiomyopathy with chronic combined systolic and diastolic heart failure, sustained symptomatic VT on amiodarone, ischemic stroke, sinus node dysfunction with dual-chamber permanent pacemaker, severe orthostatic hypotension thought to be neurogenic and on midodrine, arthritis, hyperlipidemia, moderate AS, chronic urinary retention with indwelling Foley and previousTURP, previous hematuria and penile pain with multiple recent ED visits for penile and abdominal pain with blood in Foley catheter(7/10, 7/21-DC'd from ED)but returned the next day on 7/22 for the same.  In ED had an episode of V. tach episode- was placed on amiodarone drip in ED, developed hypotension, pacemaker was interrogated-amiodarone was DC'd, cardiology consulted, states patient is having sinus tachycardia with pacing, switch to oral amiodarone.  Urology consulted for hematuria recommended CBI,   Hemoglobin dropped, received 3 units of PRBC. Hb improved 9.6 and remained stable.  Hematuria resolved.  Needs outpatient urology follow-up for cystoscopy.  Hemoglobin remained stable. Due to history of Pseudomonas and MRSA UTIs , he was started on vancomycin and cefepime for concern of sepsis, Blood and urine cultures negative at this time.  Antibiotics switched to Keflex for next 10 days. Given severe aortic stenosis cardiology recommended palliative care consult to discuss goals of care.  Antiplatelets are on hold, resume plavix only in few weeks as cardiology.   Assessment & Plan:   Active Problems:   Cardiomyopathy, ischemic   Pacemaker   Orthostatic hypotension   Hematuria   Non-sustained ventricular tachycardia (HCC)   Hypotension   SIRS (systemic  inflammatory response syndrome) (HCC)  # Recurrent hematuria with bladder hematoma and Chronic indwelling Foley for chronic urinary retention- Improving. - CT renal stone study: Large bladder hematoma with possible hemorrhagic cystitis underlying mass not excluded. - Urology on board:not a surgical candidate, Hematuria resolved, Discontinue CBI.  - Hemoglobin dropped 12.9 >> 7.5 , received 3 PRBC  , Hb  Improved 9.6 --- monitoring closely - BP improved with transfusion. - Please contact urology prior to any catheter change.  -Needs outpatient urology follow-up for cystoscopy after discharge.  # Acute blood loss anemia due to hematuria ->>> Resolved. - Hemoglobin dropped from 12.9  >>> 8.0 - S/P 3 PRBC, Hb remains 9.6, keep Hb above 8.0. -Hemoglobin remained stable.  # Tachycardia with history of VT and dual-chamber pacemaker ->>> Resolved. - She was started on amiodarone drip for concern of VT - Per cardiology and device interrogation, patient did not experience VT but instead was sinus tachycardia with V pacing response and pacemaker mediated tachycardia. - Change back to home p.o. amiodarone. -Goal K>4.0, goal Mg >2.0  #   Hypotension with history of  Chronic hypotension thought to be neurogenic - Improved. -   Received several IV fluid boluses. -   Continue midodrine. -   Recheck BP in both Upper extremities.  # SIRS concern for sepsis. - Leukocytosis (17.2 >>27.5)with tachypnea and hypotension as above . - Lactic acid 2.1 - Hemodynamically stable, mental status at baseline. - Has a history of Pseudomonas and MRSA UTIs, concern for possible urinary source/bacterial translocation. - Continue vancomycin and cefepime as empiric coverage. -  F/U Blood cultures x2 >> No growth so far -  F/U  Urine culture >> No growth so far - will change  to p.o. Keflex ( total  10 days course)  # Acute encephalopathy, suspect metabolic, AKIand hypotension induced ->> Improved. CT brain  --reviewed, within normal limits. Correct electrolyte abnormalities Hypotension - Improved. SLP eval  # AKI:  ->>>> Resolved. Likely from hemodynamic changes and urinary retention. Correct underlying issues above. Creatinine 1.53 >> 0.96  7. CAD s/p CABG slightly elevated troponin unlikely ACS. Aspirin on hold due to hematuria, Continue statin. Cardiology on board, recommended palliative care discussion.  8. Chronic combined systolic and diastolic heart failure. Bilateral lower extremity edema on exam. Torsemide was on hold due to hypotension and AKI,resumed yesterday as BP and creatinine improved. I/O and daily weight. Cardiology recommended palliative care consult to discuss goals of care  9. Moderate AS. Follow-up echo --pending Remain asymptomatic  10. Diabetes HA1C. Sensitive sliding scale   DVT prophylaxis: SCDs/ Compression stockings Code Status: DNR Family Communication:  Wife and daughter was present at bed side., Disposition Plan:  The above findings and plan of care has been discussed with patient (and family )  in detail,  they expressed understanding and agreement of above.  Dispo: The patient is from: Home.  Anticipated d/c is to: Home.  Anticipated d/c date is: > 3 days  Patient currently is not medically stable to d/c.  Hemodynamically unstable, continue needing intervention, Very deconditioned, needs PT.   Consultants:   Urology, cardiology  Procedures: Antimicrobials: Anti-infectives (From admission, onward)   Start     Dose/Rate Route Frequency Ordered Stop   08/31/19 0600  cephALEXin (KEFLEX) capsule 500 mg     Discontinue     500 mg Oral Every 8 hours 08/30/19 1316 09/05/19 0559   08/28/19 1400  ceFEPIme (MAXIPIME) 2 g in sodium chloride 0.9 % 100 mL IVPB        2 g 200 mL/hr over 30 Minutes Intravenous Every 8 hours 08/28/19 1330 08/30/19 2155   08/28/19 1000  vancomycin (VANCOREADY) IVPB 1500  mg/300 mL  Status:  Discontinued        1,500 mg 150 mL/hr over 120 Minutes Intravenous Every 24 hours 08/27/19 1511 08/30/19 1315   08/27/19 1300  vancomycin (VANCOCIN) IVPB 1000 mg/200 mL premix  Status:  Discontinued        1,000 mg 200 mL/hr over 60 Minutes Intravenous Every 24 hours 08/26/19 1055 08/27/19 1511   08/27/19 0600  cefTRIAXone (ROCEPHIN) 1 g in sodium chloride 0.9 % 100 mL IVPB  Status:  Discontinued        1 g 200 mL/hr over 30 Minutes Intravenous Every 24 hours 08/26/19 1012 08/26/19 1027   08/26/19 2300  ceFEPIme (MAXIPIME) 2 g in sodium chloride 0.9 % 100 mL IVPB  Status:  Discontinued        2 g 200 mL/hr over 30 Minutes Intravenous Every 12 hours 08/26/19 1055 08/28/19 1330   08/26/19 1130  vancomycin (VANCOREADY) IVPB 1750 mg/350 mL        1,750 mg 175 mL/hr over 120 Minutes Intravenous  Once 08/26/19 1055 08/26/19 1629   08/26/19 1130  ceFEPIme (MAXIPIME) 2 g in sodium chloride 0.9 % 100 mL IVPB        2 g 200 mL/hr over 30 Minutes Intravenous  Once 08/26/19 1055 08/26/19 1318   08/26/19 0500  cefTRIAXone (ROCEPHIN) 1 g in sodium chloride 0.9 % 100 mL IVPB        1 g 200 mL/hr over 30 Minutes Intravenous  Once 08/26/19 0454 08/26/19 0535  Subjective: Patient was seen and examined at bed side  , awake,  Alert,  Oriented x 3.  Not in acute distress,  No overnight issues. Hematuria has resolved, hemoglobin remained stable.  Patient has not had a bowel movement in few days,  asks for something to help the bowel movement. Wife present at bedside.... Plan of care was discussed in detail.   Objective: Vitals:   09/01/19 1000 09/01/19 1100 09/01/19 1200 09/01/19 1300  BP: (!) 87/59 121/76 (!) 113/61 118/76  Pulse: 69 72 70 72  Resp: (!) 35 21 (!) 31 (!) 25  Temp:   98 F (36.7 C)   TempSrc:   Oral   SpO2: 95% 99% 95% 95%  Weight:      Height:        Intake/Output Summary (Last 24 hours) at 09/01/2019 1348 Last data filed at 09/01/2019 0559 Gross per  24 hour  Intake 120 ml  Output 1575 ml  Net -1455 ml   Filed Weights   08/29/19 0500 08/30/19 0636 08/31/19 0702  Weight: 79.8 kg 79.9 kg 80.1 kg    Examination:  General exam: Appears calm and comfortable.  Respiratory system: Clear to auscultation. Respiratory effort normal. Cardiovascular system: S1 & S2 heard, RRR. No JVD, rubs, gallops or clicks.  2+ pedal edema. Gastrointestinal system: Abdomen is nondistended, soft and nontender. No organomegaly or masses felt. Normal bowel sounds heard. Central nervous system: Alert and oriented. No focal neurological deficits. Extremities: Pedal edema 2+, No rash. Bilateral arm swelling improved. Skin: No rashes, lesions or ulcers Psychiatry: Judgement and insight appear normal. Mood & affect appropriate.  Foley catheter in place.   Data Reviewed: I have personally reviewed following labs and imaging studies  CBC: Recent Labs  Lab 08/26/19 0111 08/26/19 0127 08/28/19 0214 08/28/19 0214 08/28/19 2232 08/29/19 0233 08/30/19 0253 08/31/19 0205 09/01/19 0226  WBC 17.2*   < > 17.0*  --   --  10.8* 8.5 8.9 8.0  NEUTROABS 13.2*  --   --   --   --   --   --   --   --   HGB 12.4*   < > 7.5*   < > 10.0* 9.6* 9.8* 10.3* 10.2*  HCT 37.2*   < > 23.9*   < > 30.5* 29.2* 30.5* 32.1* 31.7*  MCV 99.2   < > 99.6  --   --  95.7 95.9 98.5 97.5  PLT 303   < > 229  --   --  206 244 218 250   < > = values in this interval not displayed.   Basic Metabolic Panel: Recent Labs  Lab 08/26/19 0435 08/26/19 0801 08/27/19 0226 08/27/19 0226 08/28/19 0214 08/29/19 0233 08/30/19 0253 08/31/19 0205 09/01/19 0226  NA  --    < > 139   < > 135 137 135 137 138  K  --    < > 5.0   < > 4.3 3.7 3.1* 3.6 3.8  CL  --    < > 103   < > 102 102 99 102 99  CO2  --    < > 23   < > 24 24 27 25 26   GLUCOSE  --    < > 133*   < > 117* 98 104* 114* 101*  BUN  --    < > 25*   < > 18 16 15 13 14   CREATININE  --    < > 0.96   < >  0.80 0.77 0.72 0.65 0.65  CALCIUM  --     < > 8.5*   < > 8.4* 8.1* 7.7* 8.4* 8.4*  MG 3.1*  --  2.6*  --   --  2.7*  --  2.3  --   PHOS 4.8*  --   --   --   --  2.1* 3.1 3.0  --    < > = values in this interval not displayed.   GFR: Estimated Creatinine Clearance: 63.4 mL/min (by C-G formula based on SCr of 0.65 mg/dL). Liver Function Tests: Recent Labs  Lab 08/27/19 0226 08/29/19 0233  AST 20 33  ALT 12 18  ALKPHOS 53 55  BILITOT 0.8 0.7  PROT 5.7* 6.1*  ALBUMIN 2.7* 2.6*   No results for input(s): LIPASE, AMYLASE in the last 168 hours. No results for input(s): AMMONIA in the last 168 hours. Coagulation Profile: Recent Labs  Lab 08/26/19 0111  INR 1.0   Cardiac Enzymes: No results for input(s): CKTOTAL, CKMB, CKMBINDEX, TROPONINI in the last 168 hours. BNP (last 3 results) No results for input(s): PROBNP in the last 8760 hours. HbA1C: No results for input(s): HGBA1C in the last 72 hours. CBG: Recent Labs  Lab 08/31/19 1150 08/31/19 1743 08/31/19 2126 09/01/19 0804 09/01/19 1141  GLUCAP 152* 115* 155* 113* 152*   Lipid Profile: No results for input(s): CHOL, HDL, LDLCALC, TRIG, CHOLHDL, LDLDIRECT in the last 72 hours. Thyroid Function Tests: No results for input(s): TSH, T4TOTAL, FREET4, T3FREE, THYROIDAB in the last 72 hours. Anemia Panel: No results for input(s): VITAMINB12, FOLATE, FERRITIN, TIBC, IRON, RETICCTPCT in the last 72 hours. Sepsis Labs: Recent Labs  Lab 08/27/19 0752 08/27/19 1106  LATICACIDVEN 2.1* 4.1*    Recent Results (from the past 240 hour(s))  Culture, Urine     Status: Abnormal   Collection Time: 08/26/19  2:16 AM   Specimen: Urine, Random  Result Value Ref Range Status   Specimen Description   Final    URINE, RANDOM Performed at [facility] [address], [place], [place] [address]    Special Requests   Final    NONE Performed at [facility], [place] [address] [place], [place] [address]    Culture MULTIPLE SPECIES PRESENT,  SUGGEST RECOLLECTION (A)  Final   Report Status 08/27/2019 FINAL  Final  SARS Coronavirus 2 by RT PCR (hospital order, performed in [facility] hospital lab) Nasopharyngeal Nasopharyngeal Swab     Status: None   Collection Time: 08/26/19  3:12 AM   Specimen: Nasopharyngeal Swab  Result Value Ref Range Status   SARS Coronavirus 2 NEGATIVE NEGATIVE Final    Comment: (NOTE) SARS-CoV-2 target nucleic acids are NOT DETECTED.  The SARS-CoV-2 RNA is generally detectable in upper and lower respiratory specimens during the acute phase of infection. The lowest concentration of SARS-CoV-2 viral copies this assay can detect is 250 copies / mL. A negative result does not preclude SARS-CoV-2 infection and should not be used as the sole basis for treatment or other patient management decisions.  A negative result may occur with improper specimen collection / handling, submission of specimen other than nasopharyngeal swab, presence of viral mutation(s) within the areas targeted by this assay, and inadequate number of viral copies (<250 copies / mL). A negative result must be combined with clinical observations, patient history, and epidemiological information.  Fact Sheet for Patients:   StrictlyIdeas.no  Fact Sheet for Healthcare Providers: BankingDealers.co.za  This test is not  yet approved or  cleared by the [place] and has been authorized for detection and/or diagnosis of SARS-CoV-2 by FDA under an Emergency Use Authorization (EUA).  This EUA will remain in effect (meaning this test can be used) for the duration of the COVID-19 declaration under Section 564(b)(1) of the Act, 21 U.S.C. section 360bbb-3(b)(1), unless the authorization is terminated or revoked sooner.  Performed at [facility], [place] [address], [place], [place] [address]   MRSA PCR Screening     Status: Abnormal   Collection Time: 08/26/19  6:45 AM    Specimen: Nasopharyngeal  Result Value Ref Range Status   MRSA by PCR POSITIVE (A) NEGATIVE Final    Comment:        The GeneXpert MRSA Assay (FDA approved for NASAL specimens only), is one component of a comprehensive MRSA colonization surveillance program. It is not intended to diagnose MRSA infection nor to guide or monitor treatment for MRSA infections. RESULT CALLED TO, READ BACK BY AND VERIFIED WITH: CARTER,K @ [id number] [id number] BY POTEAT,S Performed at [facility], [place] [address] [place], [place] [address]   Culture, blood (routine x 2)     Status: None   Collection Time: 08/26/19 10:58 AM   Specimen: BLOOD RIGHT HAND  Result Value Ref Range Status   Specimen Description   Final    BLOOD RIGHT HAND Performed at [place] [facility] [address], [place], [place] [address]    Special Requests   Final    BOTTLES DRAWN AEROBIC ONLY Blood Culture results may not be optimal due to an inadequate volume of blood received in culture bottles Performed at [facility] [address], [place], [place] [address]    Culture   Final    NO GROWTH 5 DAYS Performed at [facility], [place] [address] [place], [place] [address]    Report Status 08/31/2019 FINAL  Final  Culture, blood (routine x 2)     Status: None   Collection Time: 08/26/19 10:58 AM   Specimen: BLOOD LEFT HAND  Result Value Ref Range Status   Specimen Description   Final    BLOOD LEFT HAND Performed at [facility] [address] [place], [place] [address]    Special Requests   Final    BOTTLES DRAWN AEROBIC AND ANAEROBIC Blood Culture adequate volume Performed at [facility] [address], [place], [place] [address]    Culture   Final    NO GROWTH 5 DAYS Performed at [facility], [place] [address], [place],  [address]    Report Status 08/31/2019 FINAL  Final     Radiology Studies: No results  found.  Scheduled Meds: . acidophilus  1 capsule Oral TID  . amiodarone  100 mg Oral Daily  . cephALEXin  500 mg Oral Q8H  . Chlorhexidine Gluconate Cloth  6 each Topical Daily  . Droxidopa  100 mg Oral TID WC  . feeding supplement (ENSURE ENLIVE)  237 mL Oral BID BM  . insulin aspart  0-9 Units Subcutaneous TID WC  . levothyroxine  100 mcg Oral QAC breakfast  . mouth rinse  15 mL Mouth Rinse BID  . midodrine  5 mg Oral TID WC  . nystatin  5 mL Oral QID  . oxybutynin  10 mg Oral QHS  . Plecanatide  3 mg Oral Daily  . potassium chloride  10 mEq Oral Once  .  potassium chloride  20 mEq Oral Daily  . pravastatin  40 mg Oral QHS  . rOPINIRole  0.5 mg Oral BID  . senna-docusate  1 tablet Oral BID  . sodium chloride flush  3 mL Intravenous Q12H  . torsemide  20 mg Oral Daily  . torsemide  20 mg Oral Once   Continuous Infusions: . sodium chloride irrigation       LOS: 6 days    Time spent:  25 mins.  Shawna Clamp, MD [facility]   If 7PM-7AM, please contact night-coverage

## 2019-09-01 NOTE — Progress Notes (Signed)
Patient is being transferred to room 1402.  Report has been given to receiving RN.  Spouse has been called and notified of patient's move to 4th floor.

## 2019-09-01 NOTE — Progress Notes (Addendum)
Progress Note  Patient Name: Adam [name] Date of Encounter: 09/01/2019  Primary Cardiologist: [name], MD  Subjective   No CP or SOB. Feeling well. Wife at bedside.  Inpatient Medications    Scheduled Meds: . acidophilus  1 capsule Oral TID  . amiodarone  100 mg Oral Daily  . cephALEXin  500 mg Oral Q8H  . Chlorhexidine Gluconate Cloth  6 each Topical Daily  . Droxidopa  100 mg Oral TID WC  . feeding supplement (ENSURE ENLIVE)  237 mL Oral BID BM  . insulin aspart  0-9 Units Subcutaneous TID WC  . levothyroxine  100 mcg Oral QAC breakfast  . mouth rinse  15 mL Mouth Rinse BID  . midodrine  5 mg Oral TID WC  . nystatin  5 mL Oral QID  . oxybutynin  10 mg Oral QHS  . Plecanatide  3 mg Oral Daily  . potassium chloride  10 mEq Oral Daily  . pravastatin  40 mg Oral QHS  . rOPINIRole  0.5 mg Oral BID  . sodium chloride flush  3 mL Intravenous Q12H  . torsemide  20 mg Oral Daily   Continuous Infusions: . sodium chloride irrigation     PRN Meds: acetaminophen **OR** acetaminophen, methocarbamol, polyethylene glycol, prochlorperazine, rOPINIRole   Vital Signs    Vitals:   08/31/19 2230 08/31/19 2300 09/01/19 0000 09/01/19 0400  BP:  (!) 101/54 (!) 101/48   Pulse: 70 71 70   Resp: (!) 26 (!) 27 (!) 24   Temp:   98.3 F (36.8 C) 98.3 F (36.8 C)  TempSrc:   Oral Oral  SpO2: 94% 95% 94%   Weight:      Height:        Intake/Output Summary (Last 24 hours) at 09/01/2019 0840 Last data filed at 09/01/2019 0559 Gross per 24 hour  Intake 123 ml  Output 1575 ml  Net -1452 ml   Last 3 Weights 08/31/2019 08/30/2019 08/29/2019  Weight (lbs) 176 lb 9.4 oz 176 lb 2.4 oz 175 lb 14.8 oz  Weight (kg) 80.1 kg 79.9 kg 79.8 kg     Telemetry    Atrial paced V sensed- Personally Reviewed  Physical Exam   GEN: No acute distress.  HEENT: Normocephalic, atraumatic, sclera non-icteric. Neck: No JVD or bruits. Cardiac: RRR 2/6 SEM RUSB. No rubs or gallops.   Radials/DP/PT 1+ and equal bilaterally.  Respiratory: Clear to auscultation bilaterally. Breathing is unlabored. GI: Soft, nontender, non-distended, BS +x 4. MS: no deformity. Extremities: No clubbing or cyanosis. No edema. Distal pedal pulses are 2+ and equal bilaterally. Neuro:  AAOx3. Follows commands. Psych:  Responds to questions appropriately with a normal affect.  Labs    High Sensitivity Troponin:   Recent Labs  Lab 08/26/19 0435 08/26/19 0801 08/28/19 0917  TROPONINIHS 33* 47* 34*      Cardiac EnzymesNo results for input(s): TROPONINI in the last 168 hours. No results for input(s): TROPIPOC in the last 168 hours.   Chemistry Recent Labs  Lab 08/27/19 0226 08/28/19 0214 08/29/19 0233 08/29/19 0233 08/30/19 0253 08/31/19 0205 09/01/19 0226  NA 139   < > 137   < > 135 137 138  K 5.0   < > 3.7   < > 3.1* 3.6 3.8  CL 103   < > 102   < > 99 102 99  CO2 23   < > 24   < > 27 25 26   GLUCOSE 133*   < >  98   < > 104* 114* 101*  BUN 25*   < > 16   < > 15 13 14   CREATININE 0.96   < > 0.77   < > 0.72 0.65 0.65  CALCIUM 8.5*   < > 8.1*   < > 7.7* 8.4* 8.4*  PROT 5.7*  --  6.1*  --   --   --   --   ALBUMIN 2.7*  --  2.6*  --   --   --   --   AST 20  --  33  --   --   --   --   ALT 12  --  18  --   --   --   --   ALKPHOS 53  --  55  --   --   --   --   BILITOT 0.8  --  0.7  --   --   --   --   GFRNONAA >60   < > >60   < > >60 >60 >60  GFRAA >60   < > >60   < > >60 >60 >60  ANIONGAP 13   < > 11   < > 9 10 13    < > = values in this interval not displayed.     Hematology Recent Labs  Lab 08/30/19 0253 08/31/19 0205 09/01/19 0226  WBC 8.5 8.9 8.0  RBC 3.18* 3.26* 3.25*  HGB 9.8* 10.3* 10.2*  HCT 30.5* 32.1* 31.7*  MCV 95.9 98.5 97.5  MCH 30.8 31.6 31.4  MCHC 32.1 32.1 32.2  RDW 16.3* 16.2* 15.9*  PLT 244 218 250    BNPNo results for input(s): BNP, PROBNP in the last 168 hours.   DDimer No results for input(s): DDIMER in the last 168 hours.   Radiology     No results found.  Cardiac Studies   2D Echo 08/26/19 1. Left ventricular ejection fraction, by estimation, is 40 to 45%. The  left ventricle has mildly decreased function. The left ventricle  demonstrates regional wall motion abnormalities (see scoring  diagram/findings for description). Basal to mid  inferior/inferolateral akinesis. There is severe asymmetric left  ventricular hypertrophy of the basal-septal segment. Left ventricular  diastolic parameters are indeterminate.  2. Right ventricular systolic function is mildly reduced. The right  ventricular size is mildly enlarged. Tricuspid regurgitation signal is  inadequate for assessing PA pressure.  3. The mitral valve is abnormal. Moderately calcified leaflets. No  evidence of mitral valve regurgitation.  4. Aortic dilatation noted. There is mild dilatation of the ascending  aorta measuring 38 mm.  5. The aortic valve is abnormal. Severely calcified. Aortic valve  regurgitation is mild. Severe aortic valve stenosis. Vmax 3.7 m/s, MG 35  mmHg, AVA 0.5 cm^2, DI 0.13.   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 He was admitted with gross hematuria and large bladder hematoma requiring irrigation. Patient was thought to have episode of VT that did not require intervention, but pacemaker interrogation revealed this was v-paced rhythm  with  pacemaker-mediated tachycardia, NOT ventricular tachycardia. PPM settings adjusted. Had hypotension earlier this admission that improved with resumption of home midodrine, also some angina that occurred before blood transfusion for ABL anemia. 2D echo this admission shows progression to severe aortic stenosis.  Assessment & Plan    1. Recurrent hematuria with bladder hematoma, hypotension, AKI, and development of ABL anemia this admission - antiplatelets on hold, per Dr. Sallyanne Kuster, resume clopidogrel only in several weeks - required blood transfusion this admission - per IM/urology  2. Tachycardia on admission -per device interrogation was NOT ventricular tachycardia but instead sinus tachycardia with ventricular pacing response and pacemaker mediated tachycardia. Reassuring. Settings adjusted. Per notes, normal device function, atrial dependent, only RV paces 27%. - continue home amdioarone - keep eye on lytes  3. CAD s/p CABG remotely with minimally elevated troponin - troponin was low/flat, not felt to represent ACS at this time. - ASA on hold due to #1 - continue statin - not on BB at homein context of known orthostasis  4. Chronic combined CHF, with management complicated by severe orthostatic hypotension - LVEF actually improved slightly from prior at 40-45% (previously 35-40%) although now with severe AS. Not able to tolerate guideline directed management as OP due to h/o severe hypotension. I/O'swill not be totally reliable given bladder irrigation counted in tallies. - fluid status looks OK, weight near baseline - upper extremity swelling felt possibly due to IV - now back on home torsemide, BP has tendency to be soft at baseline  5.SevereAS by echothis admission- - with current medical issues including anemia and hematuria requiring holding of antiplatelets, not presently a candidate for invasive management - Dr. Sallyanne Kuster discussed consultation with palliative care and  DNR status, will clarify whether this is to occur this hospitalization as I do not see specific order - addendum: Dr. Loletha Grayer clarifies DNR was confirmed, will update in EMR.  6. Hypokalemia - repleted yesterday, will continue KCl in form of 11meq daily for goal K 4.0 or greater  As previously mentioned, have tentatively arranged f/u 8/13, appt on AVS   For questions or updates, please contact [place] Please consult [email or web address] for contact info under Cardiology/STEMI.  Signed, Charlie Pitter, PA-C 09/01/2019, 8:40 AM     I have seen and examined the patient along with Charlie Pitter, PA-C.  I have reviewed the chart, notes and new data.  I agree with PA/NP's note.  Key new complaints: feels well, but a little dyspneic/tachypneic Key examination changes: JVP 7-8 cm, arms edematous, clear lungs. RRR, mid peaking AS murmur Key new findings / data: K 3.8, creat 0.65  PLAN: Will give an extra dose of diuretic today and KCl supplement. Suspect ready for DC tomorrow. Recommend palliative care consultation to assist with transition of care to a less aggressive stance.  [name], MD, [place] [phone] 09/01/2019, 11:38 AM

## 2019-09-01 NOTE — TOC Progression Note (Signed)
Transition of Care [facility]) - Progression Note    Patient Details  Name: Adam [name] MRN: [id number] Date of Birth: [birth year]  Transition of Care [facility]) CM/SW Contact  Leeroy Cha, RN Phone Number: 09/01/2019, 8:14 AM  Clinical Narrative:    s- remains on cbi for hematuria,  P- from [facility] [place] [facility] will return.   Expected Discharge Plan: [place] Barriers to Discharge: Continued Medical Work up  Expected Discharge Plan and Services Expected Discharge Plan: [place]   Discharge Planning Services: CM Consult Post Acute Care Choice: [place] Living arrangements for the past 2 months: [place]                                       Social Determinants of Health (SDOH) Interventions    Readmission Risk Interventions No flowsheet data found.

## 2019-09-02 DIAGNOSIS — I255 Ischemic cardiomyopathy: Secondary | ICD-10-CM | POA: Diagnosis not present

## 2019-09-02 DIAGNOSIS — Z515 Encounter for palliative care: Secondary | ICD-10-CM

## 2019-09-02 DIAGNOSIS — Z950 Presence of cardiac pacemaker: Secondary | ICD-10-CM | POA: Diagnosis not present

## 2019-09-02 DIAGNOSIS — I951 Orthostatic hypotension: Secondary | ICD-10-CM | POA: Diagnosis not present

## 2019-09-02 DIAGNOSIS — Z7189 Other specified counseling: Secondary | ICD-10-CM | POA: Diagnosis not present

## 2019-09-02 DIAGNOSIS — I472 Ventricular tachycardia: Secondary | ICD-10-CM | POA: Diagnosis not present

## 2019-09-02 DIAGNOSIS — I350 Nonrheumatic aortic (valve) stenosis: Secondary | ICD-10-CM | POA: Diagnosis not present

## 2019-09-02 LAB — GLUCOSE, CAPILLARY
Glucose-Capillary: 112 mg/dL — ABNORMAL HIGH (ref 70–99)
Glucose-Capillary: 149 mg/dL — ABNORMAL HIGH (ref 70–99)
Glucose-Capillary: 109 mg/dL — ABNORMAL HIGH (ref 70–99)
Glucose-Capillary: 115 mg/dL — ABNORMAL HIGH (ref 70–99)
Glucose-Capillary: 134 mg/dL — ABNORMAL HIGH (ref 70–99)

## 2019-09-02 LAB — BASIC METABOLIC PANEL
Anion gap: 13 (ref 5–15)
BUN: 17 mg/dL (ref 8–23)
CO2: 30 mmol/L (ref 22–32)
Calcium: 8.5 mg/dL — ABNORMAL LOW (ref 8.9–10.3)
Chloride: 95 mmol/L — ABNORMAL LOW (ref 98–111)
Creatinine, Ser: 0.88 mg/dL (ref 0.61–1.24)
GFR calc Af Amer: >60 mL/min (ref >60)
GFR calc non Af Amer: >60 mL/min (ref >60)
Glucose, Bld: 112 mg/dL — ABNORMAL HIGH (ref 70–99)
Potassium: 3.5 mmol/L (ref 3.5–5.1)
Sodium: 138 mmol/L (ref 135–145)

## 2019-09-02 NOTE — TOC Progression Note (Signed)
Transition of Care [facility] - Progression Note    Patient Details  Name: Adam [name] MRN: [id number] Date of Birth: October 17, 1926  Transition of Care [facility]) CM/SW Contact  Tifany Hirsch, [place], RN Phone Number: 09/02/2019, 1:23 PM  Clinical Narrative:  Linzie Collin rep Freda-patient has a rm hold rm#29-[facility] report tel#[phone],YEB for nurse for N29-await d/c summary.Returning under Skilled Nsg w/palliative care services. PTAR for transport.     Expected Discharge Plan: [place] Barriers to Discharge: Continued Medical Work up  Expected Discharge Plan and Services Expected Discharge Plan: [place]   Discharge Planning Services: CM Consult Post Acute Care Choice: [place] Living arrangements for the past 2 months: [place]                                       Social Determinants of Health (SDOH) Interventions    Readmission Risk Interventions No flowsheet data found.

## 2019-09-02 NOTE — NC FL2 (Signed)
[place] LEVEL OF CARE SCREENING TOOL     IDENTIFICATION  Patient Name: Adam [name] Birthdate: 1926-07-30 Sex: male Admission Date (Current Location): 08/25/2019  [facility] and [place] Number:  Herbalist and Address:  [facility],  [place] [address] [place]      Provider Number: [id number]  Attending Physician Name and Address:  Shawna Clamp, MD  Relative Name and Phone Number:  Avram Danielson dtr [phone]    Current Level of Care: Hospital Recommended Level of Care: [place] Prior Approval Number:    Date Approved/Denied:   PASRR Number: [id number]  Discharge Plan: SNF    Current Diagnoses: Patient Active Problem List   Diagnosis Date Noted  . Hypotension 08/27/2019  . SIRS (systemic inflammatory response syndrome) ([place]) 08/27/2019  . Non-sustained ventricular tachycardia ([place]) 08/26/2019  . Advanced care planning/counseling discussion 08/25/2019  . Hematuria 08/16/2019  . Allergic rhinitis 05/21/2019  . Weight loss 04/06/2019  . Seborrheic dermatitis 04/06/2019  . Edema 10/16/2018  . Stye 10/13/2018  . Ecchymosis of eye 10/13/2018  . T11 vertebral fracture ([place]) 02/13/2018  . L3 vertebral fracture ([place]) 02/13/2018  . Bilateral renal cysts 02/13/2018  . Spondylosis of cervical region without myelopathy or radiculopathy 09/25/2017  . Tear of left rotator cuff 09/25/2017  . Pharyngeal dysphagia 09/08/2017  . Generalized weakness 08/25/2017  . Right shoulder pain 08/25/2017  . Neurogenic bladder 07/21/2017  . Mild cognitive impairment 07/21/2017  . Neurogenic orthostatic hypotension ([place]) 04/21/2017  . Osteoarthritis 04/23/2016  . Gingivitis 04/15/2016  . History of arterial ischemic stroke 03/05/2016  . Aortic valve stenosis, nonrheumatic   . TIA (transient ischemic attack) 01/10/2016  . Hx of CABG 01/10/2016  . Insomnia 03/23/2015  . Gout 02/09/2015  . Depression, recurrent ([place]) 02/02/2015   . RLS (restless legs syndrome) 02/02/2015  . Combined congestive systolic and diastolic heart failure ([place]) 02/02/2015  . Urinary retention 12/30/2014  . Coronary artery disease involving native coronary artery of native heart without angina pectoris 12/30/2014  . Expressive aphasia 12/30/2014  . Chronic constipation   . Orthostatic hypotension   . Chronic knee pain 12/03/2014  . GERD (gastroesophageal reflux disease)   . Dyspnea on exertion 08/11/2014  . Hypothyroidism 08/11/2014  . Ventricular tachycardia ([place]) 07/27/2014  . Upper airway cough syndrome 07/14/2014  . Hypokalemia 01/31/2014  . SSS (sick sinus syndrome) ([place]) 01/31/2014  . Hypercholesterolemia 01/30/2014  . Cardiomyopathy, ischemic 08/25/2012  . Pacemaker 08/25/2012    Orientation RESPIRATION BLADDER Height & Weight     Self, Time, Situation, Place  Normal Indwelling catheter (chronic f/c) Weight: 80.1 kg Height:  5\' 10"  (177.8 cm)  BEHAVIORAL SYMPTOMS/MOOD NEUROLOGICAL BOWEL NUTRITION STATUS      Continent Diet (Heart Healthy cho mod)  AMBULATORY STATUS COMMUNICATION OF NEEDS Skin   Limited Assist Verbally Skin abrasions (buttock reddened-moisture)                       Personal Care Assistance Level of Assistance  Bathing, Feeding, Dressing Bathing Assistance: Limited assistance Feeding assistance: Limited assistance Dressing Assistance: Limited assistance     Functional Limitations Info  Sight, Hearing, Speech Sight Info: Adequate Hearing Info: Adequate Speech Info: Adequate    SPECIAL CARE FACTORS FREQUENCY  PT (By licensed PT), OT (By licensed OT)     PT Frequency: 5x week OT Frequency: 5x week            Contractures Contractures Info:  Not present    Additional Factors Info  Code Status, Allergies Code Status Info: DNR Allergies Info: Altace Ramipril, Crestor Rosuvastatin Calcium, Penicillins, Sulfa Antibiotics, Sulfamethoxazole-trimethoprim           Current Medications  (09/02/2019):  This is the current hospital active medication list Current Facility-Administered Medications  Medication Dose Route Frequency Provider Last Rate Last Admin  . acetaminophen (TYLENOL) tablet 650 mg  650 mg Oral Q6H PRN Harold Hedge, MD   650 mg at 08/31/19 2010   Or  . acetaminophen (TYLENOL) suppository 650 mg  650 mg Rectal Q6H PRN Harold Hedge, MD      . acidophilus (RISAQUAD) capsule 1 capsule  1 capsule Oral TID Skipper Cliche A, MD   1 capsule at 09/02/19 0812  . amiodarone (PACERONE) tablet 100 mg  100 mg Oral Daily Dunn, Dayna N, PA-C   100 mg at 09/02/19 3790  . cephALEXin (KEFLEX) capsule 500 mg  500 mg Oral Q8H Shawna Clamp, MD   500 mg at 09/02/19 0657  . Chlorhexidine Gluconate Cloth 2 % PADS 6 each  6 each Topical Daily Harold Hedge, MD   6 each at 09/01/19 1200  . Droxidopa CAPS 100 mg  100 mg Oral TID WC Leodis Sias T, RPH   100 mg at 09/02/19 0913  . feeding supplement (ENSURE ENLIVE) (ENSURE ENLIVE) liquid 237 mL  237 mL Oral BID BM Harold Hedge, MD   237 mL at 09/02/19 0916  . insulin aspart (novoLOG) injection 0-9 Units  0-9 Units Subcutaneous TID WC Harold Hedge, MD   2 Units at 09/01/19 1249  . levothyroxine (SYNTHROID) tablet 100 mcg  100 mcg Oral QAC breakfast Harold Hedge, MD   100 mcg at 09/02/19 0657  . MEDLINE mouth rinse  15 mL Mouth Rinse BID Harold Hedge, MD   15 mL at 09/02/19 0917  . methocarbamol (ROBAXIN) tablet 250 mg  250 mg Oral BID PRN Harold Hedge, MD   250 mg at 09/01/19 1248  . midodrine (PROAMATINE) tablet 5 mg  5 mg Oral TID WC Harold Hedge, MD   5 mg at 09/02/19 0809  . nystatin (MYCOSTATIN) 100000 UNIT/ML suspension 500,000 Units  5 mL Oral QID Harold Hedge, MD   500,000 Units at 09/02/19 0915  . oxybutynin (DITROPAN-XL) 24 hr tablet 10 mg  10 mg Oral QHS Harold Hedge, MD   10 mg at 09/01/19 2105  . Plecanatide TABS 3 mg  3 mg Oral Daily Eudelia Bunch, RPH   3 mg at 09/02/19 0916  . polyethylene glycol  (MIRALAX / GLYCOLAX) packet 17 g  17 g Oral Daily PRN Harold Hedge, MD   17 g at 09/01/19 1055  . potassium chloride SA (KLOR-CON) CR tablet 20 mEq  20 mEq Oral Daily Dunn, Dayna N, PA-C   20 mEq at 09/02/19 2409  . pravastatin (PRAVACHOL) tablet 40 mg  40 mg Oral QHS Harold Hedge, MD   40 mg at 09/01/19 2105  . prochlorperazine (COMPAZINE) injection 5 mg  5 mg Intravenous Q4H PRN Harold Hedge, MD      . rOPINIRole (REQUIP) tablet 0.5 mg  0.5 mg Oral BID Harold Hedge, MD   0.5 mg at 09/02/19 0915  . rOPINIRole (REQUIP) tablet 0.5 mg  0.5 mg Oral Daily PRN Harold Hedge, MD   0.5 mg at 09/01/19 1248  . senna-docusate (Senokot-S) tablet 1 tablet  1 tablet Oral BID Shawna Clamp, MD   1 tablet at 09/02/19 [phone]  . sodium chloride flush (NS) 0.9 % injection 3 mL  3 mL Intravenous Q12H Harold Hedge, MD   3 mL at 09/02/19 0916  . sodium chloride irrigation 0.9 % 3,000 mL  3,000 mL Irrigation Continuous Bjorn Loser, MD   3,000 mL at 08/29/19 0241  . torsemide (DEMADEX) tablet 20 mg  20 mg Oral Daily Shawna Clamp, MD   20 mg at 09/02/19 2836     Discharge Medications: Please see discharge summary for a list of discharge medications.  Relevant Imaging Results:  Relevant Lab Results:   Additional Information SS#[id number], [place], [place]

## 2019-09-02 NOTE — Care Management Important Message (Signed)
Important Message  Patient Details IM Letter given to the Patient Name: Adam [name] MRN: [id number] Date of Birth: [birth year]   Medicare Important Message Given:  Yes     Kerin Salen 09/02/2019, 10:20 AM

## 2019-09-02 NOTE — NC FL2 (Signed)
[place] LEVEL OF CARE SCREENING TOOL     IDENTIFICATION  Patient Name: Adam [name] Birthdate: January 17, 1927 Sex: male Admission Date (Current Location): 08/25/2019  [facility] and [place] Number:  Herbalist and Address:  [facility],  [place] [address], [place]      Provider Number: [id number]  Attending Physician Name and Address:  Shawna Clamp, MD  Relative Name and Phone Number:  Jervon Ream dtr [phone]    Current Level of Care: Hospital Recommended Level of Care: [place] Prior Approval Number:    Date Approved/Denied:   PASRR Number: [id number]  Discharge Plan: SNF    Current Diagnoses: Patient Active Problem List   Diagnosis Date Noted  . Hypotension 08/27/2019  . SIRS (systemic inflammatory response syndrome) ([place]) 08/27/2019  . Non-sustained ventricular tachycardia ([place]) 08/26/2019  . Advanced care planning/counseling discussion 08/25/2019  . Hematuria 08/16/2019  . Allergic rhinitis 05/21/2019  . Weight loss 04/06/2019  . Seborrheic dermatitis 04/06/2019  . Edema 10/16/2018  . Stye 10/13/2018  . Ecchymosis of eye 10/13/2018  . T11 vertebral fracture ([place]) 02/13/2018  . L3 vertebral fracture ([place]) 02/13/2018  . Bilateral renal cysts 02/13/2018  . Spondylosis of cervical region without myelopathy or radiculopathy 09/25/2017  . Tear of left rotator cuff 09/25/2017  . Pharyngeal dysphagia 09/08/2017  . Generalized weakness 08/25/2017  . Right shoulder pain 08/25/2017  . Neurogenic bladder 07/21/2017  . Mild cognitive impairment 07/21/2017  . Neurogenic orthostatic hypotension ([place]) 04/21/2017  . Osteoarthritis 04/23/2016  . Gingivitis 04/15/2016  . History of arterial ischemic stroke 03/05/2016  . Aortic valve stenosis, nonrheumatic   . TIA (transient ischemic attack) 01/10/2016  . Hx of CABG 01/10/2016  . Insomnia 03/23/2015  . Gout 02/09/2015  . Depression, recurrent ([place]) 02/02/2015   . RLS (restless legs syndrome) 02/02/2015  . Combined congestive systolic and diastolic heart failure ([place]) 02/02/2015  . Urinary retention 12/30/2014  . Coronary artery disease involving native coronary artery of native heart without angina pectoris 12/30/2014  . Expressive aphasia 12/30/2014  . Chronic constipation   . Orthostatic hypotension   . Chronic knee pain 12/03/2014  . GERD (gastroesophageal reflux disease)   . Dyspnea on exertion 08/11/2014  . Hypothyroidism 08/11/2014  . Ventricular tachycardia ([place]) 07/27/2014  . Upper airway cough syndrome 07/14/2014  . Hypokalemia 01/31/2014  . SSS (sick sinus syndrome) ([place]) 01/31/2014  . Hypercholesterolemia 01/30/2014  . Cardiomyopathy, ischemic 08/25/2012  . Pacemaker 08/25/2012    Orientation RESPIRATION BLADDER Height & Weight     Self, Time, Situation, Place  Normal Indwelling catheter (chronic f/c) Weight: 80.1 kg Height:  5\' 10"  (177.8 cm)  BEHAVIORAL SYMPTOMS/MOOD NEUROLOGICAL BOWEL NUTRITION STATUS      Continent Diet (Heart Healthy cho mod)  AMBULATORY STATUS COMMUNICATION OF NEEDS Skin   Limited Assist Verbally Skin abrasions (buttock reddened-moisture)                       Personal Care Assistance Level of Assistance  Bathing, Feeding, Dressing Bathing Assistance: Limited assistance Feeding assistance: Limited assistance Dressing Assistance: Limited assistance     Functional Limitations Info  Sight, Hearing, Speech Sight Info: Adequate Hearing Info: Adequate Speech Info: Adequate    SPECIAL CARE FACTORS FREQUENCY  PT (By licensed PT), OT (By licensed OT)     PT Frequency: 2x week OT Frequency: 2x week            Contractures Contractures Info:  Not present    Additional Factors Info  Code Status, Allergies Code Status Info: DNR Allergies Info: Altace Ramipril, Crestor Rosuvastatin Calcium, Penicillins, Sulfa Antibiotics, Sulfamethoxazole-trimethoprim           Current Medications  (09/02/2019):  This is the current hospital active medication list Current Facility-Administered Medications  Medication Dose Route Frequency Provider Last Rate Last Admin  . acetaminophen (TYLENOL) tablet 650 mg  650 mg Oral Q6H PRN Harold Hedge, MD   650 mg at 08/31/19 2010   Or  . acetaminophen (TYLENOL) suppository 650 mg  650 mg Rectal Q6H PRN Harold Hedge, MD      . acidophilus (RISAQUAD) capsule 1 capsule  1 capsule Oral TID Skipper Cliche A, MD   1 capsule at 09/02/19 0812  . amiodarone (PACERONE) tablet 100 mg  100 mg Oral Daily Dunn, Dayna N, PA-C   100 mg at 09/02/19 1740  . cephALEXin (KEFLEX) capsule 500 mg  500 mg Oral Q8H Shawna Clamp, MD   500 mg at 09/02/19 0657  . Chlorhexidine Gluconate Cloth 2 % PADS 6 each  6 each Topical Daily Harold Hedge, MD   6 each at 09/01/19 1200  . Droxidopa CAPS 100 mg  100 mg Oral TID WC Leodis Sias T, RPH   100 mg at 09/02/19 0913  . feeding supplement (ENSURE ENLIVE) (ENSURE ENLIVE) liquid 237 mL  237 mL Oral BID BM Harold Hedge, MD   237 mL at 09/02/19 0916  . insulin aspart (novoLOG) injection 0-9 Units  0-9 Units Subcutaneous TID WC Harold Hedge, MD   2 Units at 09/01/19 1249  . levothyroxine (SYNTHROID) tablet 100 mcg  100 mcg Oral QAC breakfast Harold Hedge, MD   100 mcg at 09/02/19 0657  . MEDLINE mouth rinse  15 mL Mouth Rinse BID Harold Hedge, MD   15 mL at 09/02/19 0917  . methocarbamol (ROBAXIN) tablet 250 mg  250 mg Oral BID PRN Harold Hedge, MD   250 mg at 09/01/19 1248  . midodrine (PROAMATINE) tablet 5 mg  5 mg Oral TID WC Harold Hedge, MD   5 mg at 09/02/19 0809  . nystatin (MYCOSTATIN) 100000 UNIT/ML suspension 500,000 Units  5 mL Oral QID Harold Hedge, MD   500,000 Units at 09/02/19 0915  . oxybutynin (DITROPAN-XL) 24 hr tablet 10 mg  10 mg Oral QHS Harold Hedge, MD   10 mg at 09/01/19 2105  . Plecanatide TABS 3 mg  3 mg Oral Daily Eudelia Bunch, RPH   3 mg at 09/02/19 0916  . polyethylene glycol  (MIRALAX / GLYCOLAX) packet 17 g  17 g Oral Daily PRN Harold Hedge, MD   17 g at 09/01/19 1055  . potassium chloride SA (KLOR-CON) CR tablet 20 mEq  20 mEq Oral Daily Dunn, Dayna N, PA-C   20 mEq at 09/02/19 8144  . pravastatin (PRAVACHOL) tablet 40 mg  40 mg Oral QHS Harold Hedge, MD   40 mg at 09/01/19 2105  . prochlorperazine (COMPAZINE) injection 5 mg  5 mg Intravenous Q4H PRN Harold Hedge, MD      . rOPINIRole (REQUIP) tablet 0.5 mg  0.5 mg Oral BID Harold Hedge, MD   0.5 mg at 09/02/19 0915  . rOPINIRole (REQUIP) tablet 0.5 mg  0.5 mg Oral Daily PRN Harold Hedge, MD   0.5 mg at 09/01/19 1248  . senna-docusate (Senokot-S) tablet 1 tablet  1 tablet Oral BID Shawna Clamp, MD   1 tablet at 09/02/19 [phone]  . sodium chloride flush (NS) 0.9 % injection 3 mL  3 mL Intravenous Q12H Harold Hedge, MD   3 mL at 09/02/19 0916  . sodium chloride irrigation 0.9 % 3,000 mL  3,000 mL Irrigation Continuous Bjorn Loser, MD   3,000 mL at 08/29/19 0241  . torsemide (DEMADEX) tablet 20 mg  20 mg Oral Daily Shawna Clamp, MD   20 mg at 09/02/19 2130     Discharge Medications: Please see discharge summary for a list of discharge medications.  Relevant Imaging Results:  Relevant Lab Results:   Additional Information SS#[id number] [place], [place]

## 2019-09-02 NOTE — Consult Note (Signed)
Consultation Note Date: 09/02/2019   Patient Name: Adam [name]  DOB: 01-19-1927  MRN: [id number]  Age / Sex: 86 y.o., male  PCP: [name], MD Referring Physician: Shawna Clamp, MD  Reason for Consultation: Establishing goals of care  HPI/Patient Profile: 86 y.o. male  with past medical history of CAD s/p CABG 7680, combined systolic/diastolic heart failure, stroke, sinus node dysfunction with dual-chamber permanent pacemaker, orthostatic hypotension on midodrine, arthritis, hyperlipidemia, urinary retention with indwelling catheter s/p TURP, history of issues with hematuria and penile pain requiring multiple ED visits admitted on 08/25/2019 with recurrent hematuria with bladder hematoma. Concern for VT but upon cardiology interrogation thought to be sinus tachycardia with V pacing response. Received continuous bladder irrigation and required blood transfusion with 3 units. Hematuria stabilized and plans for outpatient cystoscopy. Encephalopathy has improved. ECHO shows progression to severe aortic stenosis complicated by hematuria limiting use of antiplatelet therapy.   Clinical Assessment and Goals of Care: I met today at Adam [name] bedside along with his wife and daughter Oleta Mouse and Caryl Asp) present. Very pleasant gentleman and family. He is in very good spirits and reports that he is feeling well and better than he was when he arrived. They are pleased that the hematuria is resolved at current time.   I spent some time discussing with them the progression of his aortic stenosis and the expectations that this can cause symptoms such as chest pain and shortness of breath. This can also further stress the heart muscle and lead to worsening heart failure. Unfortunately with recurrent hematuria he is not a candidate for antiplatelet or anticoagulation therapy. [name] reports that they have been instructed to make the  most and enjoy each day.   We further discussed goals of care and how to plan for the future. DNR is confirmed. They are hopeful to have some level of rehab at facility as he is hopeful to get out of bed and back in wheelchair to "pedal around" with his feet. He enjoys being as active and out of bed as much as possible. We discussed palliative care as well as hospice support. They would like to have palliative to follow during rehab with transition to hospice to have support and guidance with any decline or symptoms. We discussed MOST form (they are familiar but interested in updating) and not completed today as they wish to discuss and consider further. Goals currently are to treat issues that are potentially reversible but with decline or symptoms from his cardiac disease they would opt for more comfort focused care.   All questions/concerns addressed. Emotional support provided.   Primary Decision Maker PATIENT    SUMMARY OF RECOMMENDATIONS   - Return to [facility] with palliative to follow. Plans to elect hospice in the near future.  - MOST given for further review. Hopefully they can continue to discuss and complete with outpatient palliative/[facility].   Code Status/Advance Care Planning:  DNR - confirmed and form signed and placed on shadow chart   Symptom Management:  Recommend availability of morphine sublingual 2.5-5 mg prn to be available in the event of acute chest pain/shortness of breath. This is best managed by hospice. Luckily not needed at current time.   Restless Leg Syndrome worsening symptoms: Continue Requip and utilize extra dose may consider increasing to 3 times daily and/or increasing dosage although this is not ideal with orthostatic hypotension. Consider the addition of Lyrica.   Palliative Prophylaxis:   Bowel Regimen, Delirium Protocol, Frequent Pain Assessment, Oral Care and Turn Reposition  Psycho-social/Spiritual:   Desire for further Chaplaincy  support:no  Additional Recommendations: Caregiving  Support/Resources, Education on Hospice and Grief/Bereavement Support  Prognosis:   Overall prognosis poor with severe aortic stenosis and poor functional status.   Discharge Planning: Return [facility] with outpatient palliative.      Primary Diagnoses: Present on Admission: . Hypotension . Pacemaker . SIRS (systemic inflammatory response syndrome) (HCC) . Cardiomyopathy, ischemic . Orthostatic hypotension . Hematuria   I have reviewed the medical record, interviewed the patient and family, and examined the patient. The following aspects are pertinent.  Past Medical History:  Diagnosis Date  . Arthritis    "minor, back and sometimes knees" (12/15/2012)  . Bradycardia    SSS s/p St Jude PPM 04/12/2008  . CAD (coronary artery disease) 12/30/2014   CABG (LIMA-LAD, SVG-RCA, SVG-OM in 1996).  07/2009 BMS to SVG-RCA. Cath in 04/2010 with patent stents   . Cardiomyopathy, ischemic 08/25/2012  . Chronic knee pain 12/03/2014  . Combined congestive systolic and diastolic heart failure ([place]) 02/02/2015   Hx EF 41%. BNP 96.8 02/21/15 Torsemide 04/06/15 Na 142, K 4.6, Bun 16, creat 0.89 04/20/14 BNP 111.7, Na 142, K 4.6, Bun 16, creat 0.9   . Depression with anxiety 02/02/2015   02/21/15 Hgb A1c 5.8 03/10/15 MMSE 30/30   . Dizziness, after diuretic asscoiated with hypotension and responded to fluid bolus 06/05/2011   04/28/15 US carotid R+L normal bilateral arterial velocities.    Marland Kitchen Dyspnea 08/11/2014   Followed in Pulmonary clinic/ [facility]/ [place]  - 08/11/2014  Walked RA x 1 laps @ 185 ft each stopped due to fatigue/off balance/ slow pace/  no sob or desat  - PFT's  09/26/2014  FEV1 2.26 (85 % ) ratio 76  p no % improvement from saba with DLCO  67 % corrects to 93 % for alv volume      Since prev study 08/04/13 minimal change lung vol or dlco    . Embolic cerebral infarction ([place]) 12/06/2015  . GERD (gastroesophageal reflux disease)   . Gout  02/09/2015  . Hiatal hernia   . Hyperlipidemia   . Hypertension   . Hypothyroid   . Influenza A 03/10/2016  . Insomnia   . Melanoma of back ([place]) 1976  . Myocardial infarction [facility]) 1996; 2011   "both silent" (12/15/2012)  . Nonrheumatic aortic valve stenosis   . Orthostatic hypotension   . Osteoporosis, senile   . Pacemaker   . RBBB   . Restless leg 02/02/2015  . Right leg weakness 12/06/2015  . [facility] spotted fever   . S/P CABG x 4   . Sick sinus syndrome ([place]) 01/31/2014  . Sustained ventricular tachycardia ([place]) 07/27/2014  . Urinary retention 12/30/2014   has a cathrater   Social History   Socioeconomic History  . Marital status: Married    Spouse name: Not on file  . Number of children: 2  . Years of education: Masters  . Highest education level: Not  on file  Occupational History  . Occupation: Retired Company secretary -Pensions consultant  Tobacco Use  . Smoking status: Never Smoker  . Smokeless tobacco: Never Used  Vaping Use  . Vaping Use: Never used  Substance and Sexual Activity  . Alcohol use: No    Alcohol/week: 0.0 standard drinks  . Drug use: No  . Sexual activity: Never  Other Topics Concern  . Not on file  Social History Narrative   Lives at [place] to [place] 01/09/15   Married - Violet   Never smoked   Alcohol none   Previously employed as Scientist, forensic for [employer].         Diet:Low sodium   Do you drink/eat things with caffeine? No   Marital status: Married                              What year were you married?1950   Do you live in a house, apartment, assisted living, condo, trailer, etc)?    Is it one or more stories? 1   How many persons live in your home? 2   Do you have any pets in your home? No   Current or past profession: Minister, [employer] Superiorendent   Do you exercise?     Very Little                                                 Type & how often:    Do you have a living will?  Yes   Do you have a  DNR Form? Yes   Do you have a POA/HPOA forms? Yes   Social Determinants of Health   Financial Resource Strain:   . Difficulty of Paying Living Expenses:   Food Insecurity:   . Worried About Charity fundraiser in the Last Year:   . Arboriculturist in the Last Year:   Transportation Needs:   . Film/video editor (Medical):   Marland Kitchen Lack of Transportation (Non-Medical):   Physical Activity:   . Days of Exercise per Week:   . Minutes of Exercise per Session:   Stress:   . Feeling of Stress :   Social Connections:   . Frequency of Communication with Friends and Family:   . Frequency of Social Gatherings with Friends and Family:   . Attends Religious Services:   . Active Member of Clubs or Organizations:   . Attends Archivist Meetings:   Marland Kitchen Marital Status:    Family History  Problem Relation Age of Onset  . Coronary artery disease Mother   . Diabetes Mother   . Heart disease Mother   . Coronary artery disease Father   . Diabetes Father   . Lung cancer Father   . Heart disease Father   . Anuerysm Son   . Heart disease Brother   . Colon cancer Neg Hx   . Esophageal cancer Neg Hx    Scheduled Meds: . acidophilus  1 capsule Oral TID  . amiodarone  100 mg Oral Daily  . cephALEXin  500 mg Oral Q8H  . Chlorhexidine Gluconate Cloth  6 each Topical Daily  . Droxidopa  100 mg Oral TID WC  . feeding supplement (ENSURE ENLIVE)  237 mL Oral BID BM  .  insulin aspart  0-9 Units Subcutaneous TID WC  . levothyroxine  100 mcg Oral QAC breakfast  . mouth rinse  15 mL Mouth Rinse BID  . midodrine  5 mg Oral TID WC  . nystatin  5 mL Oral QID  . oxybutynin  10 mg Oral QHS  . Plecanatide  3 mg Oral Daily  . potassium chloride  20 mEq Oral Daily  . pravastatin  40 mg Oral QHS  . rOPINIRole  0.5 mg Oral BID  . senna-docusate  1 tablet Oral BID  . sodium chloride flush  3 mL Intravenous Q12H  . torsemide  20 mg Oral Daily   Continuous Infusions: . sodium chloride irrigation      PRN Meds:.acetaminophen **OR** acetaminophen, methocarbamol, polyethylene glycol, prochlorperazine, rOPINIRole Allergies  Allergen Reactions  . Altace [Ramipril] Cough  . Crestor [Rosuvastatin Calcium] Rash  . Penicillins Rash and Other (See Comments)    Has patient had a PCN reaction causing immediate rash, facial/tongue/throat swelling, SOB or lightheadedness with hypotension: Yes Has patient had a PCN reaction causing severe rash involving mucus membranes or skin necrosis: No Has patient had a PCN reaction that required hospitalization No Has patient had a PCN reaction occurring within the last 10 years: No If all of the above answers are "NO", then may proceed with Cephalosporin use.   . Sulfa Antibiotics Rash  . Sulfamethoxazole-Trimethoprim Rash   Review of Systems  Constitutional: Positive for activity change. Negative for appetite change.  Respiratory: Negative for chest tightness and shortness of breath.   Cardiovascular: Negative for chest pain.  Neurological: Positive for weakness.    Physical Exam Vitals and nursing note reviewed.  Constitutional:      General: He is not in acute distress.    Appearance: He is ill-appearing.     Comments: Frail, elderly  Cardiovascular:     Rate and Rhythm: Normal rate.  Pulmonary:     Effort: Pulmonary effort is normal. No tachypnea, accessory muscle usage or respiratory distress.  Abdominal:     General: Abdomen is flat.     Palpations: Abdomen is soft.  Neurological:     Mental Status: He is alert and oriented to person, place, and time.     Comments: Hard of hearing complicates communication at times     Vital Signs: BP (!) 107/63   Pulse 69   Temp 98 F (36.7 C)   Resp 20   Ht _0  (1.778 m)   Wt 80.1 kg   SpO2 95%   BMI 25.34 kg/m  Pain Scale: 0-10 POSS *See Group Information*: 1-Acceptable,Awake and alert Pain Score: 0-No pain   SpO2: SpO2: 95 % O2 Device:SpO2: 95 % O2 Flow Rate: .   IO:  Intake/output summary:   Intake/Output Summary (Last 24 hours) at 09/02/2019 1130 Last data filed at 09/02/2019 0263 Gross per 24 hour  Intake 717 ml  Output 3125 ml  Net -2408 ml    LBM: Last BM Date:  (PTA) Baseline Weight: Weight: 84.1 kg Most recent weight: Weight: 80.1 kg     Palliative Assessment/Data:     Time In: 1200 Time Out: 1310 Time Total: 70 min Greater than 50%  of this time was spent counseling and coordinating care related to the above assessment and plan.  Signed by: Vinie Sill, NP Palliative Medicine Team Pager # [phone] (M-F 8a-5p) Team Phone # [phone] (Nights/Weekends)

## 2019-09-02 NOTE — Progress Notes (Signed)
PROGRESS NOTE    Adam [name]  JJO:[id number] DOB: 03/17/1926 DOA: 08/25/2019   PCP: [name], MD   Brief Narrative:  Adam [name] is a  86 year old male with past medical history of CAD s/p CABG in 1996 with extensive scarring of inferior wall MI, ischemic cardiomyopathy with chronic combined systolic and diastolic heart failure, sustained symptomatic VT on amiodarone, ischemic stroke, sinus node dysfunction with dual-chamber permanent pacemaker, severe orthostatic hypotension thought to be neurogenic and on midodrine, arthritis, hyperlipidemia, moderate AS, chronic urinary retention with indwelling Foley and previousTURP, previous hematuria and penile pain with multiple recent ED visits for penile and abdominal pain with blood in Foley catheter(7/10, 7/21-DC'd from ED)but returned the next day on 7/22 for the same.  In ED had an episode of V. tach episode- was placed on amiodarone drip in ED, developed hypotension, pacemaker was interrogated-amiodarone was DC'd, cardiology consulted, states patient is having sinus tachycardia with pacing, switch to oral amiodarone.  Urology consulted for hematuria recommended CBI,   Hemoglobin dropped, received 3 units of PRBC. Hb improved 9.6 and remained stable.  Hematuria resolved.  Needs outpatient urology follow-up for cystoscopy.  Hemoglobin remained stable. Due to history of Pseudomonas and MRSA UTIs , he was started on vancomycin and cefepime for concern of sepsis, Blood and urine cultures negative at this time.  Antibiotics switched to Keflex for next 10 days. Given severe aortic stenosis cardiology recommended palliative care consult to discuss goals of care.  Antiplatelets are on hold, resume plavix only in few weeks as cardiology.   Assessment & Plan:   Active Problems:   Cardiomyopathy, ischemic   Pacemaker   Orthostatic hypotension   Hematuria   Non-sustained ventricular tachycardia (HCC)   Hypotension   SIRS (systemic  inflammatory response syndrome) (HCC)  # Recurrent hematuria with bladder hematoma and Chronic indwelling Foley for chronic urinary retention- Improving. - CT renal stone study: Large bladder hematoma with possible hemorrhagic cystitis underlying mass not excluded. - Urology on board:not a surgical candidate, Hematuria resolved, Discontinue CBI.  - Hemoglobin dropped 12.9 >> 7.5 , received 3 PRBC  , Hb  Improved 9.6 --- monitoring closely - BP improved with transfusion. - Please contact urology prior to any catheter change.  -Needs outpatient urology follow-up for cystoscopy after discharge.  # Acute blood loss anemia due to hematuria ->>> Resolved. - Hemoglobin dropped from 12.9  >>> 8.0 - S/P 3 PRBC, Hb remains 9.6, keep Hb above 8.0. -Hemoglobin remained stable.  # Tachycardia with history of VT and dual-chamber pacemaker ->>> Resolved. - She was started on amiodarone drip for concern of VT - Per cardiology and device interrogation, patient did not experience VT but instead was sinus tachycardia with V pacing response and pacemaker mediated tachycardia. - Change back to home p.o. amiodarone. -Goal K>4.0, goal Mg >2.0  #   Hypotension with history of  Chronic hypotension thought to be neurogenic - Improved. -   Received several IV fluid boluses. -   Continue midodrine. -   Recheck BP in both Upper extremities.  # SIRS concern for sepsis. - Leukocytosis (17.2 >>27.5)with tachypnea and hypotension as above . - Lactic acid 2.1 - Hemodynamically stable, mental status at baseline. - Has a history of Pseudomonas and MRSA UTIs, concern for possible urinary source/bacterial translocation. - Continue vancomycin and cefepime as empiric coverage. -  F/U Blood cultures x2 >> No growth so far -  F/U  Urine culture >> No growth so far - will change  to p.o. Keflex ( total  10 days course)  # Acute encephalopathy, suspect metabolic, AKIand hypotension induced ->> Improved. CT brain  --reviewed, within normal limits. Correct electrolyte abnormalities Hypotension - Improved. SLP eval  # AKI:  ->>>> Resolved. Likely from hemodynamic changes and urinary retention. Correct underlying issues above. Creatinine 1.53 >> 0.96  7. CAD s/p CABG slightly elevated troponin unlikely ACS. Aspirin on hold due to hematuria, Continue statin. Cardiology on board, recommended palliative care discussion.  8. Chronic combined systolic and diastolic heart failure. Bilateral lower extremity edema on exam. Torsemide was on hold due to hypotension and AKI,resumed yesterday as BP and creatinine improved. I/O and daily weight. Cardiology recommended palliative care consult to discuss goals of care  9. Moderate AS. Repeat echocardiogram shows LVEF 45 to 50%.  Severe aortic stenosis. Remain asymptomatic  10. Diabetes HA1C. Sensitive sliding scale   DVT prophylaxis: SCDs/ Compression stockings Code Status: DNR Family Communication:  Wife and daughter was present at bed side., Disposition Plan:  The above findings and plan of care has been discussed with patient (and family )  in detail,  they expressed understanding and agreement of above.  Dispo: The patient is from: Home.  Anticipated d/c is to: SNF  Anticipated d/c date is: 7/30  Patient currently is not medically stable to d/c. Very deconditioned, needs PT.Palliative care pending.   Consultants:   Urology, cardiology  Procedures: Antimicrobials: Anti-infectives (From admission, onward)   Start     Dose/Rate Route Frequency Ordered Stop   08/31/19 0600  cephALEXin (KEFLEX) capsule 500 mg     Discontinue     500 mg Oral Every 8 hours 08/30/19 1316 09/05/19 0559   08/28/19 1400  ceFEPIme (MAXIPIME) 2 g in sodium chloride 0.9 % 100 mL IVPB        2 g 200 mL/hr over 30 Minutes Intravenous Every 8 hours 08/28/19 1330 08/30/19 2155   08/28/19 1000  vancomycin (VANCOREADY) IVPB  1500 mg/300 mL  Status:  Discontinued        1,500 mg 150 mL/hr over 120 Minutes Intravenous Every 24 hours 08/27/19 1511 08/30/19 1315   08/27/19 1300  vancomycin (VANCOCIN) IVPB 1000 mg/200 mL premix  Status:  Discontinued        1,000 mg 200 mL/hr over 60 Minutes Intravenous Every 24 hours 08/26/19 1055 08/27/19 1511   08/27/19 0600  cefTRIAXone (ROCEPHIN) 1 g in sodium chloride 0.9 % 100 mL IVPB  Status:  Discontinued        1 g 200 mL/hr over 30 Minutes Intravenous Every 24 hours 08/26/19 1012 08/26/19 1027   08/26/19 2300  ceFEPIme (MAXIPIME) 2 g in sodium chloride 0.9 % 100 mL IVPB  Status:  Discontinued        2 g 200 mL/hr over 30 Minutes Intravenous Every 12 hours 08/26/19 1055 08/28/19 1330   08/26/19 1130  vancomycin (VANCOREADY) IVPB 1750 mg/350 mL        1,750 mg 175 mL/hr over 120 Minutes Intravenous  Once 08/26/19 1055 08/26/19 1629   08/26/19 1130  ceFEPIme (MAXIPIME) 2 g in sodium chloride 0.9 % 100 mL IVPB        2 g 200 mL/hr over 30 Minutes Intravenous  Once 08/26/19 1055 08/26/19 1318   08/26/19 0500  cefTRIAXone (ROCEPHIN) 1 g in sodium chloride 0.9 % 100 mL IVPB        1 g 200 mL/hr over 30 Minutes Intravenous  Once 08/26/19 0454 08/26/19 0535  Subjective: Patient was seen and examined at bed side . Not in acute distress,  No overnight issues. Hematuria has resolved, hemoglobin remained stable. Patient is very weak, deconditioned, not able to stand. Wife present at bedside.... Plan of care was discussed in detail.   Objective: Vitals:   09/02/19 0430 09/02/19 0811 09/02/19 0912 09/02/19 0952  BP: (!) 120/63 (!) 127/52 110/67 (!) 107/63  Pulse: 73 69 71 69  Resp: 20     Temp: 98 F (36.7 C)     TempSrc:      SpO2: 95%     Weight:      Height:        Intake/Output Summary (Last 24 hours) at 09/02/2019 1242 Last data filed at 09/02/2019 0953 Gross per 24 hour  Intake 717 ml  Output 3125 ml  Net -2408 ml   Filed Weights   08/29/19 0500  08/30/19 0636 08/31/19 0702  Weight: 79.8 kg 79.9 kg 80.1 kg    Examination:  General exam: Appears calm and comfortable.  Respiratory system: Clear to auscultation. Respiratory effort normal. Cardiovascular system: S1 & S2 heard, RRR. No JVD, rubs, gallops or clicks.  2+ pedal edema. Gastrointestinal system: Abdomen is nondistended, soft and nontender. No organomegaly or masses felt. Normal bowel sounds heard. Central nervous system: Alert and oriented. No focal neurological deficits. Extremities: Pedal edema 2+, No rash. Bilateral arm swelling improved. Skin: No rashes, lesions or ulcers Psychiatry: Judgement and insight appear normal. Mood & affect appropriate.  Foley catheter in place.   Data Reviewed: I have personally reviewed following labs and imaging studies  CBC: Recent Labs  Lab 08/28/19 0214 08/28/19 0214 08/28/19 2232 08/29/19 0233 08/30/19 0253 08/31/19 0205 09/01/19 0226  WBC 17.0*  --   --  10.8* 8.5 8.9 8.0  HGB 7.5*   < > 10.0* 9.6* 9.8* 10.3* 10.2*  HCT 23.9*   < > 30.5* 29.2* 30.5* 32.1* 31.7*  MCV 99.6  --   --  95.7 95.9 98.5 97.5  PLT 229  --   --  206 244 218 250   < > = values in this interval not displayed.   Basic Metabolic Panel: Recent Labs  Lab 08/27/19 0226 08/28/19 0214 08/29/19 0233 08/30/19 0253 08/31/19 0205 09/01/19 0226 09/02/19 0514  NA 139   < > 137 135 137 138 138  K 5.0   < > 3.7 3.1* 3.6 3.8 3.5  CL 103   < > 102 99 102 99 95*  CO2 23   < > 24 27 25 26 30   GLUCOSE 133*   < > 98 104* 114* 101* 112*  BUN 25*   < > 16 15 13 14 17   CREATININE 0.96   < > 0.77 0.72 0.65 0.65 0.88  CALCIUM 8.5*   < > 8.1* 7.7* 8.4* 8.4* 8.5*  MG 2.6*  --  2.7*  --  2.3  --   --   PHOS  --   --  2.1* 3.1 3.0  --   --    < > = values in this interval not displayed.   GFR: Estimated Creatinine Clearance: 57.6 mL/min (by C-G formula based on SCr of 0.88 mg/dL). Liver Function Tests: Recent Labs  Lab 08/27/19 0226 08/29/19 0233  AST 20 33   ALT 12 18  ALKPHOS 53 55  BILITOT 0.8 0.7  PROT 5.7* 6.1*  ALBUMIN 2.7* 2.6*   No results for input(s): LIPASE, AMYLASE in the last 168 hours. No results  for input(s): AMMONIA in the last 168 hours. Coagulation Profile: No results for input(s): INR, PROTIME in the last 168 hours. Cardiac Enzymes: No results for input(s): CKTOTAL, CKMB, CKMBINDEX, TROPONINI in the last 168 hours. BNP (last 3 results) No results for input(s): PROBNP in the last 8760 hours. HbA1C: No results for input(s): HGBA1C in the last 72 hours. CBG: Recent Labs  Lab 09/01/19 1626 09/01/19 2121 09/02/19 0740 09/02/19 0948 09/02/19 1200  GLUCAP 104* 127* 112* 149* 109*   Lipid Profile: No results for input(s): CHOL, HDL, LDLCALC, TRIG, CHOLHDL, LDLDIRECT in the last 72 hours. Thyroid Function Tests: No results for input(s): TSH, T4TOTAL, FREET4, T3FREE, THYROIDAB in the last 72 hours. Anemia Panel: No results for input(s): VITAMINB12, FOLATE, FERRITIN, TIBC, IRON, RETICCTPCT in the last 72 hours. Sepsis Labs: Recent Labs  Lab 08/27/19 0752 08/27/19 1106  LATICACIDVEN 2.1* 4.1*    Recent Results (from the past 240 hour(s))  Culture, Urine     Status: Abnormal   Collection Time: 08/26/19  2:16 AM   Specimen: Urine, Random  Result Value Ref Range Status   Specimen Description   Final    URINE, RANDOM Performed at [place] [address] [place], [place] [address]    Special Requests   Final    NONE Performed at [facility], [place] [address], [place], [place] [address]    Culture MULTIPLE SPECIES PRESENT, SUGGEST RECOLLECTION (A)  Final   Report Status 08/27/2019 FINAL  Final  SARS Coronavirus 2 by RT PCR (hospital order, performed in [facility] hospital lab) Nasopharyngeal Nasopharyngeal Swab     Status: None   Collection Time: 08/26/19  3:12 AM   Specimen: Nasopharyngeal Swab  Result Value Ref Range Status   SARS Coronavirus 2 NEGATIVE NEGATIVE  Final    Comment: (NOTE) SARS-CoV-2 target nucleic acids are NOT DETECTED.  The SARS-CoV-2 RNA is generally detectable in upper and lower respiratory specimens during the acute phase of infection. The lowest concentration of SARS-CoV-2 viral copies this assay can detect is 250 copies / mL. A negative result does not preclude SARS-CoV-2 infection and should not be used as the sole basis for treatment or other patient management decisions.  A negative result may occur with improper specimen collection / handling, submission of specimen other than nasopharyngeal swab, presence of viral mutation(s) within the areas targeted by this assay, and inadequate number of viral copies (<250 copies / mL). A negative result must be combined with clinical observations, patient history, and epidemiological information.  Fact Sheet for Patients:   StrictlyIdeas.no  Fact Sheet for Healthcare Providers: BankingDealers.co.za  This test is not yet approved or  cleared by the [place] FDA and has been authorized for detection and/or diagnosis of SARS-CoV-2 by FDA under an Emergency Use Authorization (EUA).  This EUA will remain in effect (meaning this test can be used) for the duration of the COVID-19 declaration under Section 564(b)(1) of the Act, 21 U.S.C. section 360bbb-3(b)(1), unless the authorization is terminated or revoked sooner.  Performed at [facility], [place] [address] [place], [place] [address]   MRSA PCR Screening     Status: Abnormal   Collection Time: 08/26/19  6:45 AM   Specimen: Nasopharyngeal  Result Value Ref Range Status   MRSA by PCR POSITIVE (A) NEGATIVE Final    Comment:        The GeneXpert MRSA Assay (FDA approved for NASAL specimens only), is one component of a comprehensive  MRSA colonization surveillance program. It is not intended to diagnose MRSA infection nor to guide or monitor treatment  for MRSA infections. RESULT CALLED TO, READ BACK BY AND VERIFIED WITH: CARTER,K @ 9030 SP [id number] BY POTEAT,S Performed at [facility], [place] [address] [place], [place] [address]   Culture, blood (routine x 2)     Status: None   Collection Time: 08/26/19 10:58 AM   Specimen: BLOOD RIGHT HAND  Result Value Ref Range Status   Specimen Description   Final    BLOOD RIGHT HAND Performed at [place] [address], [place], [place] [address]    Special Requests   Final    BOTTLES DRAWN AEROBIC ONLY Blood Culture results may not be optimal due to an inadequate volume of blood received in culture bottles Performed at [place] [address], [place], [place] [address]    Culture   Final    NO GROWTH 5 DAYS Performed at [facility], [place] [address], [place], [place] [address]    Report Status 08/31/2019 FINAL  Final  Culture, blood (routine x 2)     Status: None   Collection Time: 08/26/19 10:58 AM   Specimen: BLOOD LEFT HAND  Result Value Ref Range Status   Specimen Description   Final    BLOOD LEFT HAND Performed at [place] [address], [place], [place] [address]    Special Requests   Final    BOTTLES DRAWN AEROBIC AND ANAEROBIC Blood Culture adequate volume Performed at [place] [address], [place], [place] [address]    Culture   Final    NO GROWTH 5 DAYS Performed at [facility], [place] [address], [place], [place] [address]    Report Status 08/31/2019 FINAL  Final     Radiology Studies: No results found.  Scheduled Meds: . acidophilus  1 capsule Oral TID  . amiodarone  100 mg Oral Daily  . cephALEXin  500 mg Oral Q8H  . Chlorhexidine Gluconate Cloth  6 each Topical Daily  . Droxidopa  100 mg Oral TID WC  . feeding supplement (ENSURE ENLIVE)  237 mL Oral BID BM  . insulin aspart  0-9 Units Subcutaneous TID WC  . levothyroxine  100 mcg  Oral QAC breakfast  . mouth rinse  15 mL Mouth Rinse BID  . midodrine  5 mg Oral TID WC  . nystatin  5 mL Oral QID  . oxybutynin  10 mg Oral QHS  . Plecanatide  3 mg Oral Daily  . potassium chloride  20 mEq Oral Daily  . pravastatin  40 mg Oral QHS  . rOPINIRole  0.5 mg Oral BID  . senna-docusate  1 tablet Oral BID  . sodium chloride flush  3 mL Intravenous Q12H  . torsemide  20 mg Oral Daily   Continuous Infusions: . sodium chloride irrigation       LOS: 7 days    Time spent:  25 mins.  Shawna Clamp, MD [facility]   If 7PM-7AM, please contact night-coverage

## 2019-09-02 NOTE — TOC Progression Note (Signed)
Transition of Care [facility]) - Progression Note    Patient Details  Name: ALLANTE WHITMIRE MRN: [id number] Date of Birth: [birth year]  Transition of Care [facility]) CM/SW Contact  Tabari Volkert, Juliann Pulse, RN Phone Number: 09/02/2019, 1:40 PM  Clinical Narrative: Will need covid ordered for d/c back to [facility] with palliative care services in am.      Expected Discharge Plan: [place] Barriers to Discharge: Continued Medical Work up  Expected Discharge Plan and Services Expected Discharge Plan: [place]   Discharge Planning Services: CM Consult Post Acute Care Choice: [facility] Living arrangements for the past 2 months: [place]                                       Social Determinants of Health (SDOH) Interventions    Readmission Risk Interventions No flowsheet data found.

## 2019-09-03 ENCOUNTER — Telehealth: Payer: Self-pay

## 2019-09-03 DIAGNOSIS — Z515 Encounter for palliative care: Secondary | ICD-10-CM

## 2019-09-03 DIAGNOSIS — I255 Ischemic cardiomyopathy: Secondary | ICD-10-CM | POA: Diagnosis not present

## 2019-09-03 DIAGNOSIS — I951 Orthostatic hypotension: Secondary | ICD-10-CM | POA: Diagnosis not present

## 2019-09-03 DIAGNOSIS — Z950 Presence of cardiac pacemaker: Secondary | ICD-10-CM | POA: Diagnosis not present

## 2019-09-03 LAB — CBC
HCT: 34.1 % — ABNORMAL LOW (ref 39.0–52.0)
Hemoglobin: 11.1 g/dL — ABNORMAL LOW (ref 13.0–17.0)
MCH: 31.3 pg (ref 26.0–34.0)
MCHC: 32.6 g/dL (ref 30.0–36.0)
MCV: 96.1 fL (ref 80.0–100.0)
Platelets: 322 10*3/uL (ref 150–400)
RBC: 3.55 MIL/uL — ABNORMAL LOW (ref 4.22–5.81)
RDW: 15.2 % (ref 11.5–15.5)
WBC: 7.8 10*3/uL (ref 4.0–10.5)
nRBC: 0 % (ref 0.0–0.2)

## 2019-09-03 LAB — SARS CORONAVIRUS 2 (TAT 6-24 HRS): SARS Coronavirus 2: NEGATIVE

## 2019-09-03 LAB — GLUCOSE, CAPILLARY
Glucose-Capillary: 132 mg/dL — ABNORMAL HIGH (ref 70–99)
Glucose-Capillary: 185 mg/dL — ABNORMAL HIGH (ref 70–99)

## 2019-09-03 LAB — MAGNESIUM: Magnesium: 2.5 mg/dL — ABNORMAL HIGH (ref 1.7–2.4)

## 2019-09-03 LAB — PHOSPHORUS: Phosphorus: 3.7 mg/dL (ref 2.5–4.6)

## 2019-09-03 MED ORDER — OXYBUTYNIN CHLORIDE ER 10 MG PO TB24: 10.0000 mg | ORAL_TABLET | Freq: Every day | ORAL | 0 refills | Status: DC

## 2019-09-03 NOTE — Addendum Note (Signed)
Addended by: Dorothea Ogle X on: 09/03/2019 09:43 AM   Modules accepted: Level of Service

## 2019-09-03 NOTE — Telephone Encounter (Signed)
Palliative Medicine RN Note: Rec'd a message from pt's daughter Caryl Asp for our NP Vinie Sill. Patient has returned to [facility] and cannot speak. I left a message for Elmo Putt passing on the information.  I returned [name]'s call to recommend she contact Mr Lagerquist's care team at [facility] in the interim & to let her know I have left a message for Elmo Putt; Caryl Asp is on hold with them on the other line.  Marjie Skiff Mychelle Kendra, RN, BSN, [facility] Palliative Medicine Team 09/03/2019 3:32 PM Office [phone]

## 2019-09-03 NOTE — Discharge Summary (Signed)
Physician Discharge Summary  Adam [name] RAX:[id number] DOB: [birth year] DOA: 08/25/2019  PCP: [name], MD  Admit date: 08/25/2019   Discharge date: 09/03/2019  Admitted From: [facility] (SNF).  Disposition: [facility] ( SNF).  Recommendations for Outpatient Follow-up:  1. Follow up with PCP in 1-2 weeks. 2. Please obtain BMP/CBC in one week. 3. Advised to follow-up with urology in 1 week. 4. Patient is being discharged with indwelling Foley catheter for urinary retention. 5. Advised to resume aspirin in the next couple of weeks. 6. Return to SNF with palliative to follow.  Plans to elect hospice in the near future.  Home Health: No.  Equipment/Devices: Indwelling Foley catheter for urinary retention.  Discharge Condition: Stable CODE STATUS:DNR Diet recommendation: Heart Healthy  Brief Summary / Hospital course: Adam [name] is 86 year old male with past medical history of CAD s/p CABG in 1996 with extensive scarring of inferior wall MI, ischemic cardiomyopathy with chronic combined systolic and diastolic heart failure, sustained symptomatic VT on amiodarone, ischemic stroke, sinus node dysfunction with dual-chamber permanent pacemaker, severe orthostatic hypotension thought to be neurogenic and on midodrine, arthritis, hyperlipidemia, moderate AS, chronic urinary retention with indwelling Foley catheter and previousTURP, previous hematuria and penile pain with multiple recent ED visits for penile and abdominal pain with blood in Foley catheter(7/10, 7/21-DC'd from ED)but returned the next day on 7/22 for the same. In ED had an episode of V. tach episode-was placed on amiodarone drip in ED, developed hypotension,Pacemaker was interrogated-amiodarone was DC'd, Cardiology consulted, states patient is having sinus tachycardia with pacing, switched to oral amiodarone.  Urology consulted for hematuria recommended CBI, Hemoglobin dropped from 12 ->> 7.5,  received 3 units of PRBC. Hb improved 10.0 and remained stable.  Hematuria resolved.  Needs outpatient urology follow-up for cystoscopy.  Hemoglobin remained stable.  Urology signed off, recommended patient should follow-up with urology in 1 week,  can be discharged with Foley catheter. Due to history of Pseudomonas and MRSA UTIs , he was started on vancomycin and cefepime for concern of sepsis, Blood and urine cultures negative at this time.  Antibiotics switched to Keflex for total 10 days. He completed course of antibiotics. Given severe aortic stenosis cardiology recommended palliative care consult to discuss goals of care.  Antiplatelets are on hold, resume  only in few weeks as cardiology.  Pall[facility] care team [facility]  Plan is to elect hospice in the near future.  Patient is being discharged to skilled nursing facility for rehab.  Discharge Diagnoses:  Active Problems:   Cardiomyopathy, ischemic   Pacemaker   Orthostatic hypotension   Hypotension   Palliative care by specialist  He was managed for below problems.  # Recurrent hematuria with bladder hematoma and Chronic indwelling Foley for chronic urinary retention. - CT renal stone study: Large bladder hematoma with possible hemorrhagic cystitis underlying mass not excluded. - Urology on board:not a surgical candidate, Hematuria resolved, Discontinue CBI.  - Hemoglobin dropped12.9 >> 7.5 , received 3 PRBC  , Hb  Improved 9.6 ---Hb 11.2 today. - BP improved with transfusion. - Please contact urology prior to any catheter change.  -Needs outpatient urology follow-up for cystoscopy after discharge.  # Acute blood loss anemia due to hematuria ->>> Resolved. - Hemoglobin dropped from 12.9>>>8.0 - S/P 3 PRBC, Hb remains 10.9 , keep Hb above 8.0. -Hemoglobin remained stable.  # Tachycardia with history of VT and dual-chamber pacemaker ->>> Resolved. -  She was started on  amiodarone drip for concern of VT - Per cardiology and device interrogation, patient did not experience VT but instead was sinus tachycardia with V pacing response and pacemaker mediated tachycardia. - Change back to home p.o. amiodarone. -Goal K>4.0, goal Mg >2.0  #   Hypotension with history of  Chronic hypotension thought to be neurogenic - Improved. -   Received several IV fluid boluses. -   Continuemidodrine. -   Recheck BP in both Upper extremities.  # SIRS concern for sepsis. - Leukocytosis (17.2>>27.5)with tachypnea and hypotension as above. - Lactic acid 2.1 - Hemodynamically stable, mental status at baseline. - Has a history of Pseudomonas and MRSA UTIs, concern for possible urinary source/bacterial translocation. - Continuevancomycin and cefepime as empiric coverage. -  F/U Blood cultures x2>> No growth so far -  F/U  Urine culture>> No growth so far - will change to p.o. Keflex ( total  10 days course)  # Acute encephalopathy, suspect metabolic, AKIand hypotension induced ->> Improved. CT brain--reviewed, within normal limits. Correct electrolyte abnormalities Hypotension - Improved. SLP eval  # AKI:  ->>>> Resolved. Likely from hemodynamic changes and urinary retention. Correct underlying issues above. Creatinine 1.53>>0.96  7. CAD s/p CABG slightly elevated troponin unlikely ACS. Aspirin on hold due to hematuria, Continue statin. Cardiology on board, recommended palliative care discussion.  8. Chronic combined systolic and diastolic heart failure. Bilateral lower extremity edema on exam. Torsemide was on hold due to hypotension and AKI,resumed yesterday as BP and creatinine improved. I/O and daily weight. Cardiology recommended palliative care consult to discuss goals of care  9. Moderate AS. Repeat echocardiogram shows LVEF 45 to 50%.  Severe aortic stenosis. Remain asymptomatic  10. Diabetes HA1C. RISS  Discharge  Instructions  Discharge Instructions    Call MD for:  difficulty breathing, headache or visual disturbances   Complete by: As directed    Call MD for:  persistant dizziness or light-headedness   Complete by: As directed    Call MD for:  persistant nausea and vomiting   Complete by: As directed    Call MD for:  temperature >100.4   Complete by: As directed    Diet - low sodium heart healthy   Complete by: As directed    Discharge instructions   Complete by: As directed    Advised to follow-up with primary care physician in 1 week. Advised to follow-up with urology in 1 week. Patient is being discharged home with indwelling Foley catheter for urinary retention. Advised to resume aspirin in the next couple of weeks.   Increase activity slowly   Complete by: As directed      Allergies as of 09/03/2019      Reactions   Altace [ramipril] Cough   Crestor [rosuvastatin Calcium] Rash   Penicillins Rash, Other (See Comments)   Has patient had a PCN reaction causing immediate rash, facial/tongue/throat swelling, SOB or lightheadedness with hypotension: Yes Has patient had a PCN reaction causing severe rash involving mucus membranes or skin necrosis: No Has patient had a PCN reaction that required hospitalization No Has patient had a PCN reaction occurring within the last 10 years: No If all of the above answers are "NO", then may proceed with Cephalosporin use.   Sulfa Antibiotics Rash   Sulfamethoxazole-trimethoprim Rash      Medication List    STOP taking these medications   aspirin EC 81 MG tablet   ORAJEL DRY MOUTH MT     TAKE these  medications   acetaminophen 500 MG tablet Commonly known as: TYLENOL Take 500 mg by mouth 3 (three) times daily.   amiodarone 100 MG tablet Commonly known as: PACERONE Take 1 tablet (100 mg total) by mouth daily.   clotrimazole-betamethasone cream Commonly known as: LOTRISONE Apply 1 application topically daily as needed.   DULoxetine 20  MG capsule Commonly known as: CYMBALTA Take 20 mg by mouth daily. For anxiety,insomnia and back pain   feeding supplement (PRO-STAT SUGAR FREE 64) Liqd Take 30 mLs by mouth daily.   fluticasone 50 MCG/ACT nasal spray Commonly known as: FLONASE Place 1 spray into both nostrils 2 (two) times daily.   guaiFENesin 100 MG/5ML Soln Commonly known as: ROBITUSSIN Take 5 mLs (100 mg total) by mouth every 4 (four) hours as needed for cough or to loosen phlegm.   ketoconazole 2 % cream Commonly known as: NIZORAL Apply 1 application topically as needed for irritation. Apply small amount behind ears and in creases beside nose BID x 7 days. MAY REPEAT PRN. (Family supplied).   levothyroxine 100 MCG tablet Commonly known as: SYNTHROID Take 100 mcg by mouth daily before breakfast.   meclizine 25 MG tablet Commonly known as: ANTIVERT Take 1 tablet (25 mg total) by mouth 2 (two) times daily as needed for dizziness.   melatonin 3 MG Tabs tablet Take 3 mg by mouth at bedtime.   methocarbamol 500 MG tablet Commonly known as: ROBAXIN Take 250 mg by mouth 2 (two) times daily as needed for muscle spasms.   midodrine 5 MG tablet Commonly known as: PROAMATINE Take 5 mg by mouth 3 (three) times daily with meals. Notify provider if BP is < 90/60.   mineral oil-hydrophilic petrolatum ointment Apply 1 application topically 2 (two) times daily as needed for dry skin. To face   [name] 100 MG Caps Generic drug: Droxidopa TAKE 1 CAPSULE BY MOUTH THREE TIMES DAILY. TAKE LAST DOSE AT LEAST 3 HOURS BEFORE BEDTIME. What changed:   how much to take  how to take this  when to take this  additional instructions   oxybutynin 10 MG 24 hr tablet Commonly known as: DITROPAN-XL Take 1 tablet (10 mg total) by mouth at bedtime.   pantoprazole 40 MG tablet Commonly known as: PROTONIX Take 40 mg by mouth daily.   Plecanatide 3 MG Tabs Commonly known as: Trulance Take 3 mg by mouth daily.    polyethylene glycol 17 g packet Commonly known as: MIRALAX / GLYCOLAX Take 17 g by mouth daily as needed for mild constipation. Take 1-2 capfuls in 8 oz of H2O   pravastatin 40 MG tablet Commonly known as: PRAVACHOL Take 1 tablet (40 mg total) by mouth at bedtime.   rOPINIRole 0.25 MG tablet Commonly known as: REQUIP Take 0.5 mg by mouth 2 (two) times daily. What changed: Another medication with the same name was removed. Continue taking this medication, and follow the directions you see here.   sennosides-docusate sodium 8.6-50 MG tablet Commonly known as: SENOKOT-S Take 2 tablets by mouth 2 (two) times daily.   torsemide 10 MG tablet Commonly known as: DEMADEX Take 20 mg by mouth daily. 20 mg once a day   Vitamin D3 125 MCG (5000 UT) Caps Take 5,000 Units by mouth every Monday.       Follow-up Information    Lendon Colonel, NP Follow up.   Specialties: Nurse Practitioner, Radiology, Cardiology Why: [facility] location - a follow-up has been scheduled for you Friday  September 17, 2019 at 10:45 AM (Arrive by 10:30 AM). Curt Bears is one of our nurse practitioners that works closely with Dr. Sallyanne Kuster. Contact information: [address] [place] [address] [phone]        [name], MD Follow up in 1 week(s).   Specialty: Internal Medicine Contact information: [place] [id number] [phone]        [name], MD .   Specialty: Cardiology Contact information: [address] [place] [address] [phone]        [facility] Follow up in 1 week(s).   Contact information: [place] [phone]             Allergies  Allergen Reactions  . Altace [Ramipril] Cough  . Crestor [Rosuvastatin Calcium] Rash  . Penicillins Rash and Other (See Comments)    Has patient had a PCN reaction causing immediate rash,  facial/tongue/throat swelling, SOB or lightheadedness with hypotension: Yes Has patient had a PCN reaction causing severe rash involving mucus membranes or skin necrosis: No Has patient had a PCN reaction that required hospitalization No Has patient had a PCN reaction occurring within the last 10 years: No If all of the above answers are "NO", then may proceed with Cephalosporin use.   . Sulfa Antibiotics Rash  . Sulfamethoxazole-Trimethoprim Rash    Consultations:  Cardiology, urology, palliative care,   Procedures/Studies: CT HEAD WO CONTRAST  Result Date: 08/26/2019 CLINICAL DATA:  Altered mental status. EXAM: CT HEAD WITHOUT CONTRAST TECHNIQUE: Contiguous axial images were obtained from the base of the skull through the vertex without intravenous contrast. COMPARISON:  January 11, 2016. FINDINGS: Brain: Mild diffuse cortical atrophy is noted. Minimal chronic ischemic white matter disease is noted. No mass effect or midline shift is noted. Ventricular size is within normal limits. There is no evidence of mass lesion, hemorrhage or acute infarction. Vascular: No hyperdense vessel or unexpected calcification. Skull: Normal. Negative for fracture or focal lesion. Sinuses/Orbits: No acute finding. Other: None. IMPRESSION: Mild diffuse cortical atrophy. Minimal chronic ischemic white matter disease. No acute intracranial abnormality seen. Electronically Signed   By: Marijo Conception M.D.   On: 08/26/2019 14:50   US PELVIS LIMITED (TRANSABDOMINAL ONLY)  Result Date: 08/29/2019 CLINICAL DATA:  Hematuria. EXAM: LIMITED ULTRASOUND OF PELVIS TECHNIQUE: Limited transabdominal ultrasound examination of the pelvis was performed. COMPARISON:  None. FINDINGS: Examination of the pelvis performed for evaluation of the bladder due to persistent clot/hematuria. Foley catheter is present within the bladder as the bladder is empty and therefore difficult to evaluate. No other focal abnormality identified.  IMPRESSION: Foley catheter present within a decompressed bladder. No focal abnormality visualized. Electronically Signed   By: Marin Olp M.D.   On: 08/29/2019 09:48   DG Chest Portable 1 View  Result Date: 08/26/2019 CLINICAL DATA:  History of pain. EXAM: PORTABLE CHEST 1 VIEW COMPARISON:  CT abdomen 01/18/2018.  Chest x-ray 01/18/2018. FINDINGS: Cardiac pacer in stable position. Prior CABG. Stable cardiomegaly. No pulmonary venous congestion. Large hiatal hernia again noted. No focal infiltrate noted. No pleural effusion or pneumothorax. IMPRESSION: 1. Cardiac pacer stable position. Prior CABG. Stable cardiomegaly. No acute abnormality identified. Chest is stable from prior exam. 2.  Large hiatal hernia again noted. Electronically Signed   By: Marcello Moores  Register   On: 08/26/2019 06:18   ECHOCARDIOGRAM COMPLETE  Result Date: 08/26/2019    ECHOCARDIOGRAM REPORT   Patient Name:  Adam [name] Date of Exam: 08/26/2019 Medical Rec #:  [id number]     Height:       70.0 in Accession #:    [id number]    Weight:       185.4 lb Date of Birth:  April 29, 1926     BSA:          2.021 m Patient Age:    26 years      BP:           105/85 mmHg Patient Gender: M             HR:           70 bpm. Exam Location:  Inpatient Procedure: 2D Echo, Cardiac Doppler, Color Doppler and Intracardiac            Opacification Agent                     STAT ECHO Reported to: Dr Gardiner Rhyme on 08/26/2019 12:18:00 PM. Indications:    R94.31 Abnormal EKG  History:        Patient has prior history of Echocardiogram examinations, most                 recent 09/02/2017. CHF and Cardiomyopathy, Previous Myocardial                 Infarction and CAD, Pacemaker and Prior CABG, Stroke, Aortic                 Valve Disease, Arrythmias:RBBB, Signs/Symptoms:Dyspnea; Risk                 Factors:Hypertension, Dyslipidemia and GERD. Sick Sinus                 Syndrome.  Sonographer:    Tiffany Dance Referring Phys: [id number] Harold Hedge  Sonographer Comments:  No subcostal window. IMPRESSIONS  1. Left ventricular ejection fraction, by estimation, is 40 to 45%. The left ventricle has mildly decreased function. The left ventricle demonstrates regional wall motion abnormalities (see scoring diagram/findings for description). Basal to mid inferior/inferolateral akinesis. There is severe asymmetric left ventricular hypertrophy of the basal-septal segment. Left ventricular diastolic parameters are indeterminate.  2. Right ventricular systolic function is mildly reduced. The right ventricular size is mildly enlarged. Tricuspid regurgitation signal is inadequate for assessing PA pressure.  3. The mitral valve is abnormal. Moderately calcified leaflets. No evidence of mitral valve regurgitation.  4. Aortic dilatation noted. There is mild dilatation of the ascending aorta measuring 38 mm.  5. The aortic valve is abnormal. Severely calcified. Aortic valve regurgitation is mild. Severe aortic valve stenosis. Vmax 3.7 m/s, MG 35 mmHg, AVA 0.5 cm^2, DI 0.13. FINDINGS  Left Ventricle: Left ventricular ejection fraction, by estimation, is 40 to 45%. The left ventricle has mildly decreased function. The left ventricle demonstrates regional wall motion abnormalities. Definity contrast agent was given IV to delineate the left ventricular endocardial borders. The left ventricular internal cavity size was normal in size. There is severe asymmetric left ventricular hypertrophy of the basal-septal segment. Left ventricular diastolic parameters are indeterminate.  LV Wall Scoring: The inferior wall and posterior wall are akinetic. The mid inferoseptal segment and basal inferoseptal segment are hypokinetic. The entire anterior wall, antero-lateral wall, entire anterior septum, and entire apex are normal. Right Ventricle: The right ventricular size is mildly enlarged. Right vetricular wall thickness was not assessed. Right ventricular systolic function is mildly reduced. Tricuspid regurgitation  signal is inadequate  for assessing PA pressure. Left Atrium: Left atrial size was normal in size. Right Atrium: Right atrial size was normal in size. Pericardium: Trivial pericardial effusion is present. Mitral Valve: The mitral valve is abnormal. There is moderate calcification of the mitral valve leaflet(s). No evidence of mitral valve regurgitation. Tricuspid Valve: The tricuspid valve is grossly normal. Tricuspid valve regurgitation is trivial. Aortic Valve: The aortic valve is abnormal. Aortic valve regurgitation is mild. Aortic regurgitation PHT measures 365 msec. Severe aortic stenosis is present. There is severe calcifcation of the aortic valve. Aortic valve mean gradient measures 32.2 mmHg. Aortic valve peak gradient measures 50.8 mmHg. Aortic valve area, by VTI measures 0.56 cm. Pulmonic Valve: The pulmonic valve was not well visualized. Pulmonic valve regurgitation is not visualized. Aorta: The aortic root is normal in size and structure and aortic dilatation noted. There is mild dilatation of the ascending aorta measuring 38 mm. IAS/Shunts: The interatrial septum was not well visualized.  LEFT VENTRICLE PLAX 2D LVIDd:         4.24 cm LVIDs:         3.65 cm LV PW:         1.47 cm LV IVS:        1.42 cm LVOT diam:     2.40 cm LV SV:         45 LV SV Index:   22 LVOT Area:     4.52 cm  RIGHT VENTRICLE RV Basal diam:  3.35 cm RV Mid diam:    2.18 cm RV S prime:     6.41 cm/s TAPSE (M-mode): 1.5 cm LEFT ATRIUM             Index       RIGHT ATRIUM           Index LA diam:        3.00 cm 1.48 cm/m  RA Area:     19.40 cm LA Vol (A2C):   70.7 ml 34.98 ml/m RA Volume:   53.60 ml  26.52 ml/m LA Vol (A4C):   58.8 ml 29.09 ml/m LA Biplane Vol: 64.9 ml 32.11 ml/m  AORTIC VALVE AV Area (Vmax):    0.65 cm AV Area (Vmean):   0.62 cm AV Area (VTI):     0.56 cm AV Vmax:           356.20 cm/s AV Vmean:          266.600 cm/s AV VTI:            0.804 m AV Peak Grad:      50.8 mmHg AV Mean Grad:      32.2 mmHg LVOT  Vmax:         51.30 cm/s LVOT Vmean:        36.400 cm/s LVOT VTI:          0.099 m LVOT/AV VTI ratio: 0.12 AI PHT:            365 msec  AORTA Ao Root diam: 3.40 cm Ao Asc diam:  3.80 cm MITRAL VALVE MV Area (PHT): 3.37 cm    SHUNTS MV Decel Time: 225 msec    Systemic VTI:  0.10 m MV E velocity: 69.00 cm/s  Systemic Diam: 2.40 cm MV A velocity: 62.90 cm/s MV E/A ratio:  1.10 Oswaldo Milian MD Electronically signed by Oswaldo Milian MD Signature Date/Time: 08/26/2019/1:19:31 PM    Final    CT Renal Stone Study  Result Date: 08/26/2019 CLINICAL DATA:  Hematuria.  Lower abdominal pain. EXAM: CT ABDOMEN AND PELVIS WITHOUT CONTRAST TECHNIQUE: Multidetector CT imaging of the abdomen and pelvis was performed following the standard protocol without IV contrast. COMPARISON:  01/18/2018 FINDINGS: Lower chest: A large hiatal hernia is again partially visualized with the majority of the stomach being located in the chest. There is associated compressive atelectasis in the left lower lobe. Trace left pleural effusion, smaller than on the prior study. Partially visualized pacemaker leads. Hepatobiliary: Subcentimeter hypodensity in the left hepatic lobe, too small to fully characterize. Unremarkable gallbladder. No biliary dilatation. Pancreas: Unremarkable. Spleen: Unremarkable. Adrenals/Urinary Tract: Unremarkable right adrenal gland. Unchanged slight left adrenal nodularity. Left renal calculi measuring up to 5 mm in the lower pole. Vascular calcifications in the right renal hilum. Left renal cysts including an unchanged 5.1 cm cyst in the lower pole. Mild bilateral hydronephrosis and right hydroureter. Moderate bladder distension. 11 cm hyperdense mass dependently in the bladder with a small amount of gas likely reflecting a large hematoma. Foley catheter in place. Stomach/Bowel: There is no evidence of bowel obstruction or inflammation. 1 cm metallic density at the base of the cecum. Vascular/Lymphatic:  Abdominal aortic atherosclerosis. Unchanged focal saccular dilatation of the infrarenal abdominal aorta with transverse diameter of 2.5 cm. No enlarged lymph nodes. Reproductive: Unremarkable prostate. Other: Mild nonspecific stranding adjacent to the bladder and in the presacral region. No fluid collection or pneumoperitoneum. Musculoskeletal: Chronic T11 compression fracture with progressive severe vertebral body height loss. Unchanged chronic L3 compression fracture with mild height loss. IMPRESSION: 1. Large bladder hematoma with Foley catheter in place. Stranding about the bladder could indicate hemorrhagic cystitis. An underlying mass is not excluded. Consider urology consultation. 2. Mild bilateral hydronephrosis. 3. Nonobstructing left nephrolithiasis. 4. Large hiatal hernia. 5. Aortic Atherosclerosis (ICD10-I70.0). Electronically Signed   By: Logan Bores M.D.   On: 08/26/2019 04:38   CUP PACEART REMOTE DEVICE CHECK  Result Date: 08/10/2019 Scheduled remote reviewed. Normal device function.  Next remote 91 days- JBox, RN/CVRS     Subjective: Patient was seen and examined at bedside.  No overnight events.  Hematuria has resolved,  hemoglobin has stable.  Patient feels much better and wants to be discharged [facility] to [facility].  Daughter was at bedside,  explained in detail about follow-up with urology.  Discharge Exam: Vitals:   09/02/19 2106 09/03/19 0631  BP: 118/78 (!) 114/64  Pulse: 71 67  Resp: 20 20  Temp: 98.4 F (36.9 C) 98.6 F (37 C)  SpO2: 95% 96%   Vitals:   09/02/19 1333 09/02/19 1528 09/02/19 2106 09/03/19 0631  BP: (!) 108/57 (!) 104/62 118/78 (!) 114/64  Pulse: 70 70 71 67  Resp:  16 20 20   Temp:  98.3 F (36.8 C) 98.4 F (36.9 C) 98.6 F (37 C)  TempSrc:  Oral Oral   SpO2:  96% 95% 96%  Weight:      Height:        General: Pt is alert, awake, not in acute distress Cardiovascular: RRR, S1/S2 +, no rubs, no gallops Respiratory: CTA bilaterally, no  wheezing, no rhonchi Abdominal: Soft, NT, ND, bowel sounds + Extremities: no edema, no cyanosis    The results of significant diagnostics from this hospitalization (including imaging, microbiology, ancillary and laboratory) are listed below for reference.     Microbiology: Recent Results (from the past 240 hour(s))  Culture, Urine     Status: Abnormal   Collection Time: 08/26/19  2:16 AM   Specimen: Urine,  Random  Result Value Ref Range Status   Specimen Description   Final    URINE, RANDOM Performed at [facility] [address], [place], [place] [address]    Special Requests   Final    NONE Performed at [facility], [place] [address], [place], [place] [address]    Culture MULTIPLE SPECIES PRESENT, SUGGEST RECOLLECTION (A)  Final   Report Status 08/27/2019 FINAL  Final  SARS Coronavirus 2 by RT PCR (hospital order, performed in [facility] hospital lab) Nasopharyngeal Nasopharyngeal Swab     Status: None   Collection Time: 08/26/19  3:12 AM   Specimen: Nasopharyngeal Swab  Result Value Ref Range Status   SARS Coronavirus 2 NEGATIVE NEGATIVE Final    Comment: (NOTE) SARS-CoV-2 target nucleic acids are NOT DETECTED.  The SARS-CoV-2 RNA is generally detectable in upper and lower respiratory specimens during the acute phase of infection. The lowest concentration of SARS-CoV-2 viral copies this assay can detect is 250 copies / mL. A negative result does not preclude SARS-CoV-2 infection and should not be used as the sole basis for treatment or other patient management decisions.  A negative result may occur with improper specimen collection / handling, submission of specimen other than nasopharyngeal swab, presence of viral mutation(s) within the areas targeted by this assay, and inadequate number of viral copies (<250 copies / mL). A negative result must be combined with clinical observations, patient history, and epidemiological  information.  Fact Sheet for Patients:   StrictlyIdeas.no  Fact Sheet for Healthcare Providers: BankingDealers.co.za  This test is not yet approved or  cleared by the [place] FDA and has been authorized for detection and/or diagnosis of SARS-CoV-2 by FDA under an Emergency Use Authorization (EUA).  This EUA will remain in effect (meaning this test can be used) for the duration of the COVID-19 declaration under Section 564(b)(1) of the Act, 21 U.S.C. section 360bbb-3(b)(1), unless the authorization is terminated or revoked sooner.  Performed at [facility], [place] [address], [place], [place] [address]   MRSA PCR Screening     Status: Abnormal   Collection Time: 08/26/19  6:45 AM   Specimen: Nasopharyngeal  Result Value Ref Range Status   MRSA by PCR POSITIVE (A) NEGATIVE Final    Comment:        The GeneXpert MRSA Assay (FDA approved for NASAL specimens only), is one component of a comprehensive MRSA colonization surveillance program. It is not intended to diagnose MRSA infection nor to guide or monitor treatment for MRSA infections. RESULT CALLED TO, READ BACK BY AND VERIFIED WITH: CARTER,K @ 1443 [id number] BY POTEAT,S Performed at [facility], [place] [address] [place], [place] [address]   Culture, blood (routine x 2)     Status: None   Collection Time: 08/26/19 10:58 AM   Specimen: BLOOD RIGHT HAND  Result Value Ref Range Status   Specimen Description   Final    BLOOD RIGHT HAND Performed at [place] [address] [place], [place] [address]    Special Requests   Final    BOTTLES DRAWN AEROBIC ONLY Blood Culture results may not be optimal due to an inadequate volume of blood received in culture bottles Performed at [place] [address], [place], [place] [address]    Culture   Final    NO GROWTH 5 DAYS Performed at [facility], [place] [address], [place], [place]  [address]    Report Status 08/31/2019 FINAL  Final  Culture, blood (routine x 2)     Status: None   Collection Time: 08/26/19 10:58 AM   Specimen: BLOOD LEFT HAND  Result Value Ref Range Status   Specimen Description   Final    BLOOD LEFT HAND Performed at [place] [place] [address], [place], [place] [address]    Special Requests   Final    BOTTLES DRAWN AEROBIC AND ANAEROBIC Blood Culture adequate volume Performed at [place] [address] [place], [place] [address]    Culture   Final    NO GROWTH 5 DAYS Performed at [facility], [place] [address], [place], [place] [address]    Report Status 08/31/2019 FINAL  Final  SARS CORONAVIRUS 2 (TAT 6-24 HRS) Nasopharyngeal Nasopharyngeal Swab     Status: None   Collection Time: 09/02/19  3:57 PM   Specimen: Nasopharyngeal Swab  Result Value Ref Range Status   SARS Coronavirus 2 NEGATIVE NEGATIVE Final    Comment: (NOTE) SARS-CoV-2 target nucleic acids are NOT DETECTED.  The SARS-CoV-2 RNA is generally detectable in upper and lower respiratory specimens during the acute phase of infection. Negative results do not preclude SARS-CoV-2 infection, do not rule out co-infections with other pathogens, and should not be used as the sole basis for treatment or other patient management decisions. Negative results must be combined with clinical observations, patient history, and epidemiological information. The expected result is Negative.  Fact Sheet for Patients: SugarRoll.be  Fact Sheet for Healthcare Providers: https://www.woods-mathews.com/  This test is not yet approved or cleared by the [place] FDA and  has been authorized for detection and/or diagnosis of SARS-CoV-2 by FDA under an Emergency Use Authorization (EUA). This EUA will remain  in effect (meaning this test can be used) for the duration  of the COVID-19 declaration under Se ction 564(b)(1) of the Act, 21 U.S.C. section 360bbb-3(b)(1), unless the authorization is terminated or revoked sooner.  Performed at [facility], [place] [address] [place], [place] [address]      Labs: BNP (last 3 results) No results for input(s): BNP in the last 8760 hours. Basic Metabolic Panel: Recent Labs  Lab 08/29/19 0233 08/30/19 0253 08/31/19 0205 09/01/19 0226 09/02/19 0514 09/03/19 0545  NA 137 135 137 138 138  --   K 3.7 3.1* 3.6 3.8 3.5  --   CL 102 99 102 99 95*  --   CO2 24 27 25 26 30   --   GLUCOSE 98 104* 114* 101* 112*  --   BUN 16 15 13 14 17   --   CREATININE 0.77 0.72 0.65 0.65 0.88  --   CALCIUM 8.1* 7.7* 8.4* 8.4* 8.5*  --   MG 2.7*  --  2.3  --   --  2.5*  PHOS 2.1* 3.1 3.0  --   --  3.7   Liver Function Tests: Recent Labs  Lab 08/29/19 0233  AST 33  ALT 18  ALKPHOS 55  BILITOT 0.7  PROT 6.1*  ALBUMIN 2.6*   No results for input(s): LIPASE, AMYLASE in the last 168 hours. No results for input(s): AMMONIA in the last 168 hours. CBC: Recent Labs  Lab 08/29/19 0233 08/30/19 0253 08/31/19 0205 09/01/19 0226 09/03/19 0545  WBC 10.8* 8.5 8.9 8.0 7.8  HGB 9.6* 9.8* 10.3* 10.2* 11.1*  HCT 29.2* 30.5* 32.1* 31.7* 34.1*  MCV 95.7 95.9 98.5 97.5 96.1  PLT 206 244 218 250 322   Cardiac Enzymes: No results for input(s): CKTOTAL, CKMB, CKMBINDEX, TROPONINI in the last 168 hours. BNP: Invalid input(s): POCBNP CBG: Recent Labs  Lab 09/02/19 0948 09/02/19 1200 09/02/19 1613 09/02/19 2104 09/03/19 0743  GLUCAP 149* 109* 115* 134* 132*   D-Dimer No results for input(s): DDIMER in the last 72 hours. Hgb A1c No results for input(s): HGBA1C in the last 72 hours. Lipid Profile No results for input(s): CHOL, HDL, LDLCALC, TRIG, CHOLHDL, LDLDIRECT in the last 72 hours. Thyroid function studies No results for input(s): TSH, T4TOTAL, T3FREE, THYROIDAB in the last 72 hours.  Invalid input(s):  FREET3 Anemia work up No results for input(s): VITAMINB12, FOLATE, FERRITIN, TIBC, IRON, RETICCTPCT in the last 72 hours. Urinalysis    Component Value Date/Time   COLORURINE RED (A) 08/26/2019 0216   APPEARANCEUR TURBID (A) 08/26/2019 0216   LABSPEC 1.005 08/26/2019 0216   PHURINE 6.0 08/26/2019 0216   GLUCOSEU NEGATIVE 08/26/2019 0216   HGBUR MODERATE (A) 08/26/2019 0216   BILIRUBINUR NEGATIVE 08/26/2019 0216   KETONESUR NEGATIVE 08/26/2019 0216   PROTEINUR 100 (A) 08/26/2019 0216   UROBILINOGEN 1.0 12/06/2014 1152   NITRITE NEGATIVE 08/26/2019 0216   LEUKOCYTESUR SMALL (A) 08/26/2019 0216   Sepsis Labs Invalid input(s): PROCALCITONIN,  WBC,  LACTICIDVEN Microbiology Recent Results (from the past 240 hour(s))  Culture, Urine     Status: Abnormal   Collection Time: 08/26/19  2:16 AM   Specimen: Urine, Random  Result Value Ref Range Status   Specimen Description   Final    URINE, RANDOM Performed at [facility], [place] [address], [place], [place] [address]    Special Requests   Final    NONE Performed at [facility], [place] [address] [place], [place] [address]    Culture MULTIPLE SPECIES PRESENT, SUGGEST RECOLLECTION (A)  Final   Report Status 08/27/2019 FINAL  Final  SARS Coronavirus 2 by RT PCR (hospital order, performed in [facility] hospital lab) Nasopharyngeal Nasopharyngeal Swab     Status: None   Collection Time: 08/26/19  3:12 AM   Specimen: Nasopharyngeal Swab  Result Value Ref Range Status   SARS Coronavirus 2 NEGATIVE NEGATIVE Final    Comment: (NOTE) SARS-CoV-2 target nucleic acids are NOT DETECTED.  The SARS-CoV-2 RNA is generally detectable in upper and lower respiratory specimens during the acute phase of infection. The lowest concentration of SARS-CoV-2 viral copies this assay can detect is 250 copies / mL. A negative result does not preclude SARS-CoV-2 infection and should not be used as the sole basis for  treatment or other patient management decisions.  A negative result may occur with improper specimen collection / handling, submission of specimen other than nasopharyngeal swab, presence of viral mutation(s) within the areas targeted by this assay, and inadequate number of viral copies (<250 copies / mL). A negative result must be combined with clinical observations, patient history, and epidemiological information.  Fact Sheet for Patients:   StrictlyIdeas.no  Fact Sheet for Healthcare Providers: BankingDealers.co.za  This test is not yet approved or  cleared by the [place] FDA and has been authorized for detection and/or diagnosis of SARS-CoV-2 by FDA under an Emergency Use Authorization (EUA).  This EUA will remain in effect (meaning this test can be used) for the duration of the COVID-19 declaration under Section 564(b)(1) of the Act, 21 U.S.C. section 360bbb-3(b)(1), unless the authorization is terminated or revoked sooner.  Performed at [facility], [place] [address] [place], [place] [address]   MRSA PCR Screening     Status: Abnormal   Collection Time: 08/26/19  6:45 AM   Specimen: Nasopharyngeal  Result Value Ref Range Status   MRSA by PCR POSITIVE (A) NEGATIVE Final    Comment:        The GeneXpert MRSA Assay (FDA approved for NASAL specimens only), is one component of a comprehensive MRSA colonization surveillance program. It is not intended to diagnose MRSA infection nor to guide or monitor treatment for MRSA infections. RESULT CALLED TO, READ BACK BY AND VERIFIED WITH: CARTER,K @ 0932 [id number] BY POTEAT,S Performed at [facility], [place] [address] [place], [place] [address]   Culture, blood (routine x 2)     Status: None   Collection Time: 08/26/19 10:58 AM   Specimen: BLOOD RIGHT HAND  Result Value Ref Range Status   Specimen Description   Final    BLOOD RIGHT  HAND Performed at [facility] [address], [place], [place] [address]    Special Requests   Final    BOTTLES DRAWN AEROBIC ONLY Blood Culture results may not be optimal due to an inadequate volume of blood received in culture bottles Performed at [place] [address], [place], [place] [address]    Culture   Final    NO GROWTH 5 DAYS Performed at [facility], [place] [address] [place], [place] [address]    Report Status 08/31/2019 FINAL  Final  Culture, blood (routine x 2)     Status: None   Collection Time: 08/26/19 10:58 AM   Specimen: BLOOD LEFT HAND  Result Value Ref Range Status   Specimen Description   Final    BLOOD LEFT HAND Performed at [facility] [address], [place], [place] [address]    Special Requests   Final    BOTTLES DRAWN AEROBIC AND ANAEROBIC Blood Culture adequate volume Performed at [facility] [address] [place], [place] [address]    Culture   Final    NO GROWTH 5 DAYS Performed at [facility], [place] [address], [place], [place] [address]    Report Status 08/31/2019 FINAL  Final  SARS CORONAVIRUS 2 (TAT 6-24 HRS) Nasopharyngeal Nasopharyngeal Swab     Status: None   Collection Time: 09/02/19  3:57 PM   Specimen: Nasopharyngeal Swab  Result Value Ref Range Status   SARS Coronavirus 2 NEGATIVE NEGATIVE Final    Comment: (NOTE) SARS-CoV-2 target nucleic acids are NOT DETECTED.  The SARS-CoV-2 RNA is generally detectable in upper and lower respiratory specimens during the acute phase of infection. Negative results do not preclude SARS-CoV-2 infection, do not rule out co-infections with other pathogens, and should not be used as the sole basis for treatment or other patient management decisions. Negative results must be combined with clinical observations, patient history, and epidemiological information. The expected result is Negative.  Fact  Sheet for Patients: SugarRoll.be  Fact Sheet for Healthcare Providers: https://www.woods-mathews.com/  This test is not yet approved or cleared by the [place] FDA and  has been authorized for detection and/or diagnosis of SARS-CoV-2 by FDA under an Emergency Use Authorization (EUA). This EUA will remain  in effect (meaning this test can be used) for the duration of the COVID-19 declaration under Se ction 564(b)(1) of the Act, 21 U.S.C. section 360bbb-3(b)(1), unless the authorization is terminated or revoked sooner.  Performed at [facility], [place] [address], [place], [place] [address]      Time coordinating discharge: Over 30 minutes  SIGNED:   Shawna Clamp, MD  [facility] 09/03/2019, 11:03 AM Pager   If 7PM-7AM, please contact night-coverage [email or web address]

## 2019-09-03 NOTE — TOC Transition Note (Addendum)
Transition of Care [facility]) - CM/SW Discharge Note   Patient Details  Name: Adam [name] MRN: [id number] Date of Birth: 1926/07/21  [facility] CM/SW Contact:  Dessa Phi, RN Phone Number: 09/03/2019, 11:52 AM   Clinical Narrative:12;30p-going to rm#N29 Healthcare center nurse Melissa N29 tel#report [phone]. PTAR called. No further CM needs.   [facility] able to accept back-await to send d/c summary for review Palliative care services also @ [email or web address] for transport when ready.        Barriers to Discharge: No Barriers Identified   Patient Goals and CMS Choice Patient states their goals for this hospitalization and ongoing recovery are:: [facility] CMS Medicare.gov Compare Post Acute Care list provided to:: Patient    Discharge Placement              Patient chooses bed at: Other - please specify in the comment section below: ([facility]) Patient to be transferred to facility by: [place] Name of family member notified: [name] spouse Patient and family notified of of transfer: 09/03/19  Discharge Plan and Services   Discharge Planning Services: CM Consult Post Acute Care Choice: [facility]                               Social Determinants of Health (SDOH) Interventions     Readmission Risk Interventions No flowsheet data found.

## 2019-09-03 NOTE — Discharge Instructions (Signed)
BK Virus Infection, Adult  BK virus is a common infection that can cause a mild respiratory illness. It is also called polyomavirus. After the first infection, the virus remains in the kidneys and other parts of the urinary tract, but it is inactive (latent). BK virus very rarely causes problems in healthy people. Once the virus becomes inactive, a person may never have any more symptoms. If your body's defense system (immune system) is weak, the BK virus may become active again. If this happens, the virus can cause inflammation in the urinary tract and can damage the kidneys. What are the causes? A weakened immune system can reactivate the BK virus and cause an infection. Many things can weaken the immune system, including:  Diseases or medical conditions, such as HIV (human immunodeficiency virus), AIDS (acquired immunodeficiency syndrome), and diabetes.  Chemotherapy and other medicines that suppress or weaken the immune system.  Solid organ transplants, especially kidney transplants.  Bone marrow transplants.  Stem cell transplants. What are the signs or symptoms? Symptoms of a BK virus infection include:  Blurry vision.  Brown or red urine.  Painful or difficult urination.  Urinating more than usual.  Flu-like symptoms, including fever, fatigue, muscle aches, and weakness.  Trouble breathing.  Coughing.  Seizures. Sometimes, a BK virus infection may not cause any symptoms. How is this diagnosed? This condition is diagnosed based on your symptoms, your medical history, and a physical exam. The exam may include blood and urine tests to confirm the diagnosis and evaluate how well your kidneys are working. To confirm the diagnosis, your health care provider may need to take a small sample of your kidney and have it examined (biopsy). How is this treated? If you have mild BK virus symptoms, treatment may not be needed. Treatment for a more severe BK virus infection may  include:  Medicine for pain or discomfort.  Antiviral medicines to lower the amount of BK virus in the body.  IV fluids to help wash (flush) the virus out of the bladder. If you are taking medicine that suppresses the immune system (immunosuppressant) or prevents the body from rejecting a transplanted organ (anti-rejection medicine), the dosage of the medicine may be adjusted or lowered. Follow these instructions at home:   Take over-the-counter and prescription medicines only as told by your health care provider.  Return to your normal activities as told by your health care provider. Ask what activities are safe for you.  Keep all follow-up visits as told by your health care provider. This is important.  Drink enough fluid to keep your urine pale yellow. Contact a health care provider if:  You have new or worsening symptoms.  You have pain or discomfort while urinating.  Your urine is brownish or bloody.  You have pain or cramping in your abdomen.  You have vision changes.  You have swelling in your feet and lower legs. Get help right away if:  You have severe pain and cramping in the back or abdomen.  You have trouble breathing. Summary  BK virus is a common infection that can cause a mild respiratory illness. After the first infection, the virus remains inactive (latent) in the urinary system. It may become active again if you have a weak body defense system (immune system).  To confirm the diagnosis, your health care provider may need to do blood and urine tests and take a small sample of your kidney and have it examined (biopsy).  Treatment for a more severe BK  virus infection may include medicines and IV fluids. This information is not intended to replace advice given to you by your health care provider. Make sure you discuss any questions you have with your health care provider. Document Revised: 02/05/2017 Document Reviewed: 04/17/2016 Elsevier Patient Education   [place].   Bladder Injury Repair Bladder injury repair is a procedure to close a cut or tear in the bladder. You may have this procedure if:  Your bladder was cut during a surgery.  You have an injury in your lower abdomen, such as a broken pelvis, especially if you have a history of alcohol abuse.  Your bladder suddenly burst due to injury (trauma). This procedure is often performed as an emergency treatment. Tell a health care provider about:  All medicines you are taking, including vitamins, herbs, eye drops, creams, and over-the-counter medicines.  Any allergies you have.  Any problems you or family members have had with anesthetic medicines.  Any blood disorders you have.  Any surgeries you have had.  Any medical conditions you have.  Whether you are pregnant or may be pregnant.  Any bladder problems you have, including: ? A full bladder. ? Trouble urinating. ? Pain and tenderness.  Any time you have had to self-catheterize in the past.  Any accidents or injuries you have had that may have hurt your lower abdomen. What are the risks? Generally, this is a safe procedure. However, problems may occur, including:  Urinary tract infection.  Severe bleeding (hemorrhage).  Allergic reactions to medicines or dyes.  Damage to other structures or organs.  Blockage in a ureter. Ureters are the parts of the body that drain urine from the kidneys to the bladder.  Pain. What happens before the procedure? Medicine  Ask your health care provider about: ? Changing or stopping your regular medicines. This is especially important if you are taking diabetes medicines or blood thinners. ? Taking medicines such as aspirin and ibuprofen. These medicines can thin your blood. Do not take these medicines unless your health care provider tells you to take them. ? Taking over-the-counter medicines, vitamins, herbs, and supplements.  You may be given an antibiotic medicine  to help prevent infection. Staying hydrated Follow instructions from your health care provider about hydration, which may include:  Up to 2 hours before the procedure - you may continue to drink clear liquids, such as water, clear fruit juice, black coffee, and plain tea. Eating and drinking restrictions Follow instructions from your health care provider about eating or drinking restrictions:  8 hours before the procedure - stop eating heavy meals or foods such as meat, fried foods, or fatty foods.  6 hours before the procedure - stop eating light meals or foods, such as toast or cereal.  6 hours before the procedure - stop drinking milk or drinks that contain milk.  2 hours before the procedure - stop drinking clear liquids. General instructions   You may have a small, thin tube (catheter) placed in the part of your body that drains urine from your bladder (urethra). This catheter will drain your urine during and after the procedure (urinary catheter).  You may have tests, such as a CT cystogram. This involves injecting dye into the bladder and then doing a CT scan of the bladder. Dye may also be used to check whether your ureters have been injured.  You will have a blood test and a urine test.  Ask your health care provider how your surgical site will  be marked or identified.  Plan to have someone take you home from the hospital or clinic.  Plan to have a responsible adult care for you for at least 24 hours after you leave the hospital or clinic. This is important. What happens during the procedure?  To lower your risk of infection: ? Your health care team will wash or sanitize their hands. ? Hair may be removed from the surgical area. ? Your skin will be washed with soap.  An IV will be inserted into one of your veins.  You will be given one or more of the following: ? A medicine to numb the area (local anesthetic). ? A medicine to make you fall asleep (general  anesthetic).  An incision will be made in your abdomen.  Bladder cuts and tears will be closed using absorbable stitches (sutures).  Drainage tubes may be placed near your incision to drain any excess fluid from the surgical area (pelvic drain).  A urinary catheter may be inserted. This may be inserted if you do not have one yet, or if you have one but need another one.  The incision will be closed with sutures and covered with a bandage (dressing). The procedure may vary among health care providers and hospitals. What happens after the procedure?  You will need to stay in the hospital to recover. Ask your health care provider how long you can expect to stay.  Your blood pressure, heart rate, breathing rate, and blood oxygen level will be monitored until the medicines you were given have worn off.  You may be given antibiotics to help prevent infection. Summary  Bladder injury repair is a procedure to close a cut or tear in the bladder. This is often an emergency procedure.  Risks of this procedure include urinary tract infection, bleeding, allergic reactions, and a blockage in a part of the body that drains urine from the kidneys to the bladder (ureter).  After the procedure, you will have a urinary catheter draining your urine, and a pelvic drain to drain excess fluid from the surgical area. This information is not intended to replace advice given to you by your health care provider. Make sure you discuss any questions you have with your health care provider. Document Revised: 05/19/2018 Document Reviewed: 05/14/2016 Elsevier Patient Education  [place].   Hematoma A hematoma is a collection of blood under the skin, in an organ, in a body space, in a joint space, or in other tissue. The blood can thicken (clot) to form a lump that you can see and feel. The lump is often firm and may become sore and tender. Most hematomas get better in a few days to weeks. However, some  hematomas may be serious and require medical care. Hematomas can range from very small to very large. What are the causes? This condition is caused by:  A blunt or penetrating injury.  A leakage from a blood vessel under the skin.  Some medical procedures, including surgeries, such as oral surgery, face lifts, and surgeries on the joints.  Some medical conditions that cause bleeding or bruising. There may be multiple hematomas that appear in different areas of the body. What increases the risk? You are more likely to develop this condition if:  You are an older adult.  You use blood thinners. What are the signs or symptoms?  Symptoms of this condition depend on where the hematoma is located.  Common symptoms of a hematoma that is under the skin  include:  A firm lump on the body.  Pain and tenderness in the area.  Bruising. Blue, dark blue, purple-red, or yellowish skin (discoloration) may appear at the site of the hematoma if the hematoma is close to the surface of the skin. Common symptoms of a hematoma that is deep in the tissues or body spaces may be less obvious. They include:  A collection of blood in the stomach (intra-abdominal hematoma). This may cause pain in the abdomen, weakness, fainting, and shortness of breath.  A collection of blood in the head (intracranial hematoma). This may cause a headache or symptoms such as weakness, trouble speaking or understanding, or a change in consciousness. How is this diagnosed? This condition is diagnosed based on:  Your medical history.  A physical exam.  Imaging tests, such as an ultrasound or CT scan. These may be needed if your health care provider suspects a hematoma in deeper tissues or body spaces.  Blood tests. These may be needed if your health care provider believes that the hematoma is caused by a medical condition. How is this treated? Treatment for this condition depends on the cause, size, and location of the  hematoma. Treatment may include:  Doing nothing. The majority of hematomas do not need treatment as many of them go away on their own over time.  Surgery or close monitoring. This may be needed for large hematomas or hematomas that affect vital organs.  Medicines. Medicines may be given if there is an underlying medical cause for the hematoma. Follow these instructions at home: Managing pain, stiffness, and swelling   If directed, put ice on the affected area. ? Put ice in a plastic bag. ? Place a towel between your skin and the bag. ? Leave the ice on for 20 minutes, 2-3 times a day for the first couple of days.  If directed, apply heat to the affected area after applying ice for a couple of days. Use the heat source that your health care provider recommends, such as a moist heat pack or a heating pad. ? Place a towel between your skin and the heat source. ? Leave the heat on for 20-30 minutes. ? Remove the heat if your skin turns bright red. This is especially important if you are unable to feel pain, heat, or cold. You may have a greater risk of getting burned.  Raise (elevate) the affected area above the level of your heart while you are sitting or lying down.  If told, wrap the affected area with an elastic bandage. The bandage applies pressure (compression) to the area, which may help to reduce swelling and promote healing. Do not wrap the bandage too tightly around the affected area.  If your hematoma is on a leg or foot (lower extremity) and is painful, your health care provider may recommend crutches. Use them as told by your health care provider. General instructions  Take over-the-counter and prescription medicines only as told by your health care provider.  Keep all follow-up visits as told by your health care provider. This is important. Contact a health care provider if:  You have a fever.  The swelling or discoloration gets worse.  You develop more hematomas. Get  help right away if:  Your pain is worse or your pain is not controlled with medicine.  Your skin over the hematoma breaks or starts bleeding.  Your hematoma is in your chest or abdomen and you have weakness, shortness of breath, or a change in consciousness.  You have a hematoma on your scalp that is caused by a fall or injury, and you also have: ? A headache that gets worse. ? Trouble speaking or understanding speech. ? Weakness. ? Change in alertness or consciousness. Summary  A hematoma is a collection of blood under the skin, in an organ, in a body space, in a joint space, or in other tissue.  This condition usually does not need treatment because many hematomas go away on their own over time.  Large hematomas, or those that may affect vital organs, may need surgical drainage or monitoring. If the hematoma is caused by a medical condition, medicines may be prescribed.  Get help right away if your hematoma breaks or starts to bleed, you have shortness of breath, or you have a headache or trouble speaking after a fall. This information is not intended to replace advice given to you by your health care provider. Make sure you discuss any questions you have with your health care provider. Document Revised: 06/17/2018 Document Reviewed: 06/26/2017 Elsevier Patient Education  [place].   Bladder Injury Repair, Care After This sheet gives you information about how to care for yourself after your procedure. Your health care provider may also give you more specific instructions. If you have problems or questions, contact your health care provider. What can I expect after the procedure? After the procedure, it is common to have:  An overactive bladder. This means you may urinate more than normal, have strong urges to urinate, and have urine leaks. These problems may last for a few weeks up to a few months.  Some incision pain.  An ache in your abdomen. You may also have:  A  urinary catheter in place. This is a small, thin tube placed in the part of your body that drains urine from your bladder (urethra). The catheter will be removed after your health care provider determines that your bladder has healed. The healing may take 10 or more days, and you will stay in the hospital during that time.  A pelvic drain in place for several days. This is a tube placed near your incision to drain excess fluid from the surgical area.  Tests, such as a CT cystogram before the urinary catheter is removed. This may be done to make sure your bladder has healed. This involves injecting dye into the bladder and then doing a CT scan of the bladder. Follow these instructions at home: Medicines  Take over-the-counter and prescription medicines only as told by your health care provider.  If you were prescribed an antibiotic medicine, take it as told by your health care provider. Do not stop taking the antibiotic even if you start to feel better. Activity  Do not drive or use heavy machinery while taking prescription pain medicine.  Limit your physical activity as told by your health care provider. Ask what activities are safe for you. Your health care provider may recommend that you: ? Do not lift anything that is heavier than 25 lb (11 kg) for up to 3 months. ? Do not do any exercise besides walking for 3 months. Incision care  Follow instructions from your health care provider about how to take care of your incision. Make sure you: ? Wash your hands with soap and water before you change your bandage (dressing). If soap and water are not available, use hand sanitizer. ? Change your dressing as told by your health care provider. ? Leave stitches (sutures), skin glue, or adhesive  strips in place. These skin closures may need to stay in place for 2 weeks or longer. If adhesive strip edges start to loosen and curl up, you may trim the loose edges. Do not remove adhesive strips completely  unless your health care provider tells you to do that.  Check your incision area every day for signs of infection. Check for: ? Redness, swelling, or more pain. ? Fluid or blood. ? Warmth. ? Pus or a bad smell.  General instructions  To prevent or treat constipation while you are taking prescription pain medicine, your health care provider may recommend that you: ? Drink enough fluid to keep your urine pale yellow. ? Take over-the-counter or prescription medicines. ? Eat foods that are high in fiber, such as fresh fruits and vegetables, whole grains, and beans. ? Limit foods that are high in fat and processed sugars, such as fried and sweet foods.   Do not take baths, swim, or use a hot tub until your health care provider approves. Ask your health care provider if you may take showers. You may only be allowed to take sponge baths.  Keep all follow-up visits as told by your health care provider. This is important. Contact a health care provider if:  You do not produce as much urine as you normally do.  You have a fever or chills.  You have severe pain: ? In your abdomen. ? In your back.  You have trouble emptying your bladder completely.  Your urine is cloudy or bloody.  Your urine has an unusual smell.  Your sutures fall out too soon.  You have redness, swelling, or more pain around your incision.  You have fluid or blood coming from your incision.  Your incision feels warm to the touch.  You have pus or a bad smell coming from your incision. Get help right away if:  You are less alert and more drowsy than usual.  You have a fast heart rate.  Your skin gets pale or gray, and you are sweating.  You have swelling or pain in your legs.  You have shortness of breath.  You have chest pain. Summary  Keep all follow-up visits as told by your health care provider. This is important.  Take over-the-counter and prescription medicines only as told by your health  care provider.  Contact your healthcare provider if you have severe pain in your abdomen or your back. This information is not intended to replace advice given to you by your health care provider. Make sure you discuss any questions you have with your health care provider. Document Revised: 05/19/2018 Document Reviewed: 05/15/2016 Elsevier Patient Education  [place].   Hematoma A hematoma is a collection of blood. A hematoma can happen:  Under the skin.  In an organ.  In a body space.  In a joint space.  In other tissues. The blood can thicken (clot) to form a lump that you can see and feel. The lump is often hard and may become sore and tender. The lump can be very small or very big. Most hematomas get better in a few days to weeks. However, some hematomas may be serious and need medical care. What are the causes? This condition is caused by:  An injury.  Blood that leaks under the skin.  Problems from surgeries.  Medical conditions that cause bleeding or bruising. What increases the risk? You are more likely to develop this condition if:  You are an older adult.  You use medicines that thin your blood. What are the signs or symptoms? Symptoms depend on where the hematoma is in your body.  If the hematoma is under the skin, there is: ? A firm lump on the body. ? Pain and tenderness in the area. ? Bruising. The skin above the lump may be blue, dark blue, purple-red, or yellowish.  If the hematoma is deep in the tissues or body spaces, there may be: ? Blood in the stomach. This may cause pain in the belly (abdomen), weakness, passing out (fainting), and shortness of breath. ? Blood in the head. This may cause a headache, weakness, trouble speaking or understanding speech, or passing out. How is this diagnosed? This condition is diagnosed based on:  Your medical history.  A physical exam.  Imaging tests, such as ultrasound or CT scan.  Blood  tests. How is this treated? Treatment depends on the cause, size, and location of the hematoma. Treatment may include:  Doing nothing. Many hematomas go away on their own without treatment.  Surgery or close monitoring. This may be needed for large hematomas or hematomas that affect the body's organs.  Medicines. These may be given if a medical condition caused the hematoma. Follow these instructions at home: Managing pain, stiffness, and swelling   If told, put ice on the area. ? Put ice in a plastic bag. ? Place a towel between your skin and the bag. ? Leave the ice on for 20 minutes, 2-3 times a day for the first two days.  If told, put heat on the affected area after putting ice on the area for two days. Use the heat source that your doctor tells you to use. This could be a moist heat pack or a heating pad. To do this: ? Place a towel between your skin and the heat source. ? Leave the heat on for 20-30 minutes. ? Remove the heat if your skin turns bright red. This is very important if you are unable to feel pain, heat, or cold. You may have a greater risk of getting burned.  Raise (elevate) the affected area above the level of your heart while you are sitting or lying down.  Wrap the affected area with an elastic bandage, if told by your doctor. Do not wrap the bandage too tightly.  If your hematoma is on a leg or foot and is painful, your doctor may give you crutches. Use them as told by your doctor. General instructions  Take over-the-counter and prescription medicines only as told by your doctor.  Keep all follow-up visits as told by your doctor. This is important. Contact a doctor if:  You have a fever.  The swelling or bruising gets worse.  You start to get more hematomas. Get help right away if:  Your pain gets worse.  Your pain is not getting better with medicine.  Your skin over the hematoma breaks or starts to bleed.  Your hematoma is in your chest or  belly and you: ? Pass out. ? Feel weak. ? Become short of breath.  You have a hematoma on your scalp that is caused by a fall or injury, and you: ? Have a headache that gets worse. ? Have trouble speaking or understanding speech. ? Become less alert or you pass out. Summary  A hematoma is a collection of blood in any part of your body.  Most hematomas get better on their own in a few days to weeks. Some may need medical  care.  Follow instructions from your doctor about how to care for your hematoma.  Contact a doctor if the swelling or bruising gets worse, or if you are short of breath. This information is not intended to replace advice given to you by your health care provider. Make sure you discuss any questions you have with your health care provider. Document Revised: 06/26/2017 Document Reviewed: 06/26/2017 Elsevier Patient Education  [place].   Hematuria, Adult Hematuria is blood in the urine. Blood may be visible in the urine, or it may be identified with a test. This condition can be caused by infections of the bladder, urethra, kidney, or prostate. Other possible causes include:  Kidney stones.  Cancer of the urinary tract.  Too much calcium in the urine.  Conditions that are passed from parent to child (inherited conditions).  Exercise that requires a lot of energy. Infections can usually be treated with medicine, and a kidney stone usually will pass through your urine. If neither of these is the cause of your hematuria, more tests may be needed to identify the cause of your symptoms. It is very important to tell your health care provider about any blood in your urine, even if it is painless or the blood stops without treatment. Blood in the urine, when it happens and then stops and then happens again, can be a symptom of a very serious condition, including cancer. There is no pain in the initial stages of many urinary cancers. Follow these instructions at  home: Medicines  Take over-the-counter and prescription medicines only as told by your health care provider.  If you were prescribed an antibiotic medicine, take it as told by your health care provider. Do not stop taking the antibiotic even if you start to feel better. Eating and drinking  Drink enough fluid to keep your urine clear or pale yellow. It is recommended that you drink 3-4 quarts (2.8-3.8 L) a day. If you have been diagnosed with an infection, it is recommended that you drink cranberry juice in addition to large amounts of water.  Avoid caffeine, tea, and carbonated beverages. These tend to irritate the bladder.  Avoid alcohol because it may irritate the prostate (men). General instructions  If you have been diagnosed with a kidney stone, follow your health care provider's instructions about straining your urine to catch the stone.  Empty your bladder often. Avoid holding urine for long periods of time.  If you are male: ? After a bowel movement, wipe from front to back and use each piece of toilet paper only once. ? Empty your bladder before and after sex.  Pay attention to any changes in your symptoms. Tell your health care provider about any changes or any new symptoms.  It is your responsibility to get your test results. Ask your health care provider, or the department performing the test, when your results will be ready.  Keep all follow-up visits as told by your health care provider. This is important. Contact a health care provider if:  You develop back pain.  You have a fever.  You have nausea or vomiting.  Your symptoms do not improve after 3 days.  Your symptoms get worse. Get help right away if:  You develop severe vomiting and are unable take medicine without vomiting.  You develop severe pain in your back or abdomen even though you are taking medicine.  You pass a large amount of blood in your urine.  You pass blood clots in your  urine.  You feel very weak or like you might faint.  You faint. Summary  Hematuria is blood in the urine. It has many possible causes.  It is very important that you tell your health care provider about any blood in your urine, even if it is painless or the blood stops without treatment.  Take over-the-counter and prescription medicines only as told by your health care provider.  Drink enough fluid to keep your urine clear or pale yellow. This information is not intended to replace advice given to you by your health care provider. Make sure you discuss any questions you have with your health care provider. Document Revised: 06/17/2018 Document Reviewed: 02/24/2016 [employer] Patient Education  2020 [place] to follow-up with primary care physician in 1 week. Advised to follow-up with urology in 1 week. Patient is being discharged home with indwelling Foley catheter for urinary retention. Advised to resume aspirin in the next couple of weeks.

## 2019-09-03 NOTE — Progress Notes (Signed)
Report called to Almyra Free, RN at [facility]. Discharge instructions given to [facility] and patient.  IV's removed, and personal belongings with patient.   Pt. Discharged with indwelling foley cathter.

## 2019-09-09 ENCOUNTER — Non-Acute Institutional Stay (SKILLED_NURSING_FACILITY): Payer: Medicare PPO | Admitting: Internal Medicine

## 2019-09-09 ENCOUNTER — Telehealth: Payer: Self-pay

## 2019-09-09 ENCOUNTER — Encounter: Payer: Self-pay | Admitting: Internal Medicine

## 2019-09-09 DIAGNOSIS — I472 Ventricular tachycardia, unspecified: Secondary | ICD-10-CM

## 2019-09-09 DIAGNOSIS — E032 Hypothyroidism due to medicaments and other exogenous substances: Secondary | ICD-10-CM

## 2019-09-09 DIAGNOSIS — D62 Acute posthemorrhagic anemia: Secondary | ICD-10-CM | POA: Diagnosis not present

## 2019-09-09 DIAGNOSIS — R319 Hematuria, unspecified: Secondary | ICD-10-CM | POA: Diagnosis not present

## 2019-09-09 DIAGNOSIS — K5909 Other constipation: Secondary | ICD-10-CM

## 2019-09-09 DIAGNOSIS — F339 Major depressive disorder, recurrent, unspecified: Secondary | ICD-10-CM

## 2019-09-09 DIAGNOSIS — G2581 Restless legs syndrome: Secondary | ICD-10-CM

## 2019-09-09 DIAGNOSIS — I5040 Unspecified combined systolic (congestive) and diastolic (congestive) heart failure: Secondary | ICD-10-CM | POA: Diagnosis not present

## 2019-09-09 DIAGNOSIS — G903 Multi-system degeneration of the autonomic nervous system: Secondary | ICD-10-CM

## 2019-09-09 NOTE — Progress Notes (Signed)
Provider:  Lavinia Sharps Location:   [place] Room Number: 16 Place of Service:   SNF  PCP: [name], MD Patient Care Team: [name], MD as PCP - General (Internal Medicine) [name], MD as PCP - Cardiology (Cardiology) Croitoru, Dani Gobble, MD as Attending Physician (Cardiology) Vevelyn Royals, MD as Consulting Physician (Ophthalmology) Franchot Gallo, MD as Consulting Physician (Urology) Tanda Rockers, MD as Consulting Physician (Pulmonary Disease) Allyn Kenner, MD (Dermatology) Deliah Goody, PA-C as Physician Assistant (Physician Assistant) Mast, Man X, NP as Nurse Practitioner (Internal Medicine) Ngetich, Nelda Bucks, NP as Nurse Practitioner (Family Medicine)  Extended Emergency Contact Information Primary Emergency Contact: [name] of [place] Phone: [phone] Relation: Daughter Secondary Emergency Contact: Kosmicki,[name] Address: [address]          [place] Phone: [phone] Mobile Phone: [phone] Relation: Spouse  Code Status: DNR Goals of Care: Advanced Directive information Advanced Directives 09/09/2019  Does Patient Have a Medical Advance Directive? Yes  Type of Paramedic of [place];Out of facility DNR (pink MOST or yellow form)  Does patient want to make changes to medical advance directive? No - Patient declined  Copy of [place] in Chart? Yes - validated most recent copy scanned in chart (See row information)  Would patient like information on creating a medical advance directive? -  Pre-existing out of facility DNR order (yellow form or pink MOST form) Pink MOST form placed in chart (order not valid for inpatient use)      Chief Complaint  Patient presents with  . Readmit To SNF    Readmission    HPI: Patient is a 86 y.o. male seen today for admission to SNF for therapy and Long term  Care He was admitted in the hospital from 7 21-7 30 with hematuria, sepsis, tachycardia and hypotension.  He has h/o CAD s/p Bypass surgery with Extensive Inferior Wall Scarring, Ischemic Cardiomyopathy withsystolic heart failureEF of 35-40%Moderate AS,history of ventricular tachycardia stable on amiodarone, sinus node dysfunction with dual-chamber PPP, severe orthostatic hypotension neurogenic,history of compression fractures, history of ischemic stroke in the past on dual therapy.,History of chronic indwelling Foley catheter  Patient was sent to the hospital for hematuria and abdominal pain.  He developed tachycardia with hypotension in ED.  He also dropped his hemoglobin from 12-7.5 needing 3 units of transfusion. Hematuria Was evaluated by urology.  CT scan was negative for any masses.  Urology stopped antiplatelet therapy.  And possible outpatient follow-up Acute blood loss anemia due to hematuria Resolved after transfusion Tachycardia with history of VT Cardiology said to continue amiodarone for now Sepsis with leukocytosis.  Was treated with broad-spectrum antibiotics and discharged on p.o. Keflex Severe aortic stenosis Cardiology is recommending palliative care for now  Patient is back to his baseline.  Complaining of weakness.  No more hematuria. His only complaint was poor appetite and constipation.  Past Medical History:  Diagnosis Date  . Arthritis    "minor, back and sometimes knees" (12/15/2012)  . Bradycardia    SSS s/p St Jude PPM 04/12/2008  . CAD (coronary artery disease) 12/30/2014   CABG (LIMA-LAD, SVG-RCA, SVG-OM in 1996).  07/2009 BMS to SVG-RCA. Cath in 04/2010 with patent stents   . Cardiomyopathy, ischemic 08/25/2012  . Chronic knee pain 12/03/2014  . Combined congestive systolic and diastolic heart failure ([place]) 02/02/2015   Hx EF 41%. BNP 96.8 02/21/15 Torsemide  04/06/15 Na 142, K 4.6, Bun 16, creat 0.89 04/20/14 BNP 111.7, Na 142, K 4.6, Bun 16, creat 0.9   .  Depression with anxiety 02/02/2015   02/21/15 Hgb A1c 5.8 03/10/15 MMSE 30/30   . Dizziness, after diuretic asscoiated with hypotension and responded to fluid bolus 06/05/2011   04/28/15 US carotid R+L normal bilateral arterial velocities.    Marland Kitchen Dyspnea 08/11/2014   Followed in Pulmonary clinic/ [facility]/ [place]  - 08/11/2014  Walked RA x 1 laps @ 185 ft each stopped due to fatigue/off balance/ slow pace/  no sob or desat  - PFT's  09/26/2014  FEV1 2.26 (85 % ) ratio 76  p no % improvement from saba with DLCO  67 % corrects to 93 % for alv volume      Since prev study 08/04/13 minimal change lung vol or dlco    . Embolic cerebral infarction ([place]) 12/06/2015  . GERD (gastroesophageal reflux disease)   . Gout 02/09/2015  . Hiatal hernia   . Hyperlipidemia   . Hypertension   . Hypothyroid   . Influenza A 03/10/2016  . Insomnia   . Melanoma of back ([place]) 1976  . Myocardial infarction [facility]) 1996; 2011   "both silent" (12/15/2012)  . Nonrheumatic aortic valve stenosis   . Orthostatic hypotension   . Osteoporosis, senile   . Pacemaker   . RBBB   . Restless leg 02/02/2015  . Right leg weakness 12/06/2015  . [facility] spotted fever   . S/P CABG x 4   . Sick sinus syndrome ([place]) 01/31/2014  . Sustained ventricular tachycardia ([place]) 07/27/2014  . Urinary retention 12/30/2014   has a cathrater   Past Surgical History:  Procedure Laterality Date  . CARDIAC CATHETERIZATION  04/2010   LIMA to LAD patent,SVG to OM patent,no in-stnet restenosis RCA  . CATARACT EXTRACTION W/ INTRAOCULAR LENS  IMPLANT, BILATERAL Bilateral 2012  . CORONARY ANGIOPLASTY WITH STENT PLACEMENT  07/2009   bare metal stent to SVG to the RCA  . CORONARY ARTERY BYPASS GRAFT  1996   LIMA to LAD,SVG to RCA & SVG to OM  . INSERT / REPLACE / REMOVE PACEMAKER  2010  . MELANOMA EXCISION  05/1974 X2   "taken off my back" (12/15/2012)  . NM MYOVIEW LTD  06/2011   low risk  . PPM GENERATOR CHANGEOUT N/A 01/22/2017   Procedure: PPM  GENERATOR CHANGEOUT;  Surgeon: [name], MD;  Location: [place] CV LAB;  Service: Cardiovascular;  Laterality: N/A;  . TONSILLECTOMY  1938  . TRANSURETHRAL RESECTION OF PROSTATE  1986  . US ECHOCARDIOGRAPHY  07/11/2009   EF 45-50%    reports that he has never smoked. He has never used smokeless tobacco. He reports that he does not drink alcohol and does not use drugs. Social History   Socioeconomic History  . Marital status: Married    Spouse name: Not on file  . Number of children: 2  . Years of education: Masters  . Highest education level: Not on file  Occupational History  . Occupation: Retired Company secretary -Pensions consultant  Tobacco Use  . Smoking status: Never Smoker  . Smokeless tobacco: Never Used  Vaping Use  . Vaping Use: Never used  Substance and Sexual Activity  . Alcohol use: No    Alcohol/week: 0.0 standard drinks  . Drug use: No  . Sexual activity: Never  Other Topics Concern  . Not on file  Social History Narrative   Lives at [facility]  Home [place] to [place] 01/09/15   Married - Violet   Never smoked   Alcohol none   Previously employed as Scientist, forensic for [employer].         Diet:Low sodium   Do you drink/eat things with caffeine? No   Marital status: Married                              What year were you married?1950   Do you live in a house, apartment, assisted living, condo, trailer, etc)?    Is it one or more stories? 1   How many persons live in your home? 2   Do you have any pets in your home? No   Current or past profession: Minister, [employer]iorendent   Do you exercise?     Very Little                                                 Type & how often:    Do you have a living will?  Yes   Do you have a DNR Form? Yes   Do you have a POA/HPOA forms? Yes   Social Determinants of Health   Financial Resource Strain:   . Difficulty of Paying Living Expenses:   Food Insecurity:   . Worried About Charity fundraiser in the  Last Year:   . Arboriculturist in the Last Year:   Transportation Needs:   . Film/video editor (Medical):   Marland Kitchen Lack of Transportation (Non-Medical):   Physical Activity:   . Days of Exercise per Week:   . Minutes of Exercise per Session:   Stress:   . Feeling of Stress :   Social Connections:   . Frequency of Communication with Friends and Family:   . Frequency of Social Gatherings with Friends and Family:   . Attends Religious Services:   . Active Member of Clubs or Organizations:   . Attends Archivist Meetings:   Marland Kitchen Marital Status:   Intimate Partner Violence:   . Fear of Current or Ex-Partner:   . Emotionally Abused:   Marland Kitchen Physically Abused:   . Sexually Abused:     Functional Status Survey:    Family History  Problem Relation Age of Onset  . Coronary artery disease Mother   . Diabetes Mother   . Heart disease Mother   . Coronary artery disease Father   . Diabetes Father   . Lung cancer Father   . Heart disease Father   . Anuerysm Son   . Heart disease Brother   . Colon cancer Neg Hx   . Esophageal cancer Neg Hx     Health Maintenance  Topic Date Due  . INFLUENZA VACCINE  09/05/2019  . TETANUS/TDAP  08/01/2026 (Originally 08/01/2024)  . DEXA SCAN  12/14/2024  . COVID-19 Vaccine  Completed  . PNA vac Low Risk Adult  Completed    Allergies  Allergen Reactions  . Altace [Ramipril] Cough  . Crestor [Rosuvastatin Calcium] Rash  . Penicillins Rash and Other (See Comments)    Has patient had a PCN reaction causing immediate rash, facial/tongue/throat swelling, SOB or lightheadedness with hypotension: Yes Has patient had a PCN reaction causing severe rash involving mucus membranes or skin necrosis: No Has  patient had a PCN reaction that required hospitalization No Has patient had a PCN reaction occurring within the last 10 years: No If all of the above answers are "NO", then may proceed with Cephalosporin use.   . Sulfa Antibiotics Rash  .  Sulfamethoxazole-Trimethoprim Rash    Allergies as of 09/09/2019      Reactions   Altace [ramipril] Cough   Crestor [rosuvastatin Calcium] Rash   Penicillins Rash, Other (See Comments)   Has patient had a PCN reaction causing immediate rash, facial/tongue/throat swelling, SOB or lightheadedness with hypotension: Yes Has patient had a PCN reaction causing severe rash involving mucus membranes or skin necrosis: No Has patient had a PCN reaction that required hospitalization No Has patient had a PCN reaction occurring within the last 10 years: No If all of the above answers are "NO", then may proceed with Cephalosporin use.   Sulfa Antibiotics Rash   Sulfamethoxazole-trimethoprim Rash      Medication List       Accurate as of September 09, 2019  2:28 PM. If you have any questions, ask your nurse or doctor.        acetaminophen 500 MG tablet Commonly known as: TYLENOL Take 500 mg by mouth 3 (three) times daily.   amiodarone 100 MG tablet Commonly known as: PACERONE Take 1 tablet (100 mg total) by mouth daily.   clotrimazole-betamethasone cream Commonly known as: LOTRISONE Apply 1 application topically daily as needed.   DULoxetine 20 MG capsule Commonly known as: CYMBALTA Take 20 mg by mouth daily. For anxiety,insomnia and back pain   feeding supplement (PRO-STAT SUGAR FREE 64) Liqd Take 30 mLs by mouth daily.   fluticasone 50 MCG/ACT nasal spray Commonly known as: FLONASE Place 1 spray into both nostrils 2 (two) times daily.   guaiFENesin 100 MG/5ML Soln Commonly known as: ROBITUSSIN Take 5 mLs (100 mg total) by mouth every 4 (four) hours as needed for cough or to loosen phlegm.   ketoconazole 2 % cream Commonly known as: NIZORAL Apply 1 application topically as needed for irritation. Apply small amount behind ears and in creases beside nose BID x 7 days. MAY REPEAT PRN. (Family supplied).   levothyroxine 100 MCG tablet Commonly known as: SYNTHROID Take 100 mcg by  mouth daily before breakfast.   meclizine 25 MG tablet Commonly known as: ANTIVERT Take 1 tablet (25 mg total) by mouth 2 (two) times daily as needed for dizziness.   melatonin 3 MG Tabs tablet Take 3 mg by mouth at bedtime.   methocarbamol 500 MG tablet Commonly known as: ROBAXIN Take 250 mg by mouth 2 (two) times daily as needed for muscle spasms.   midodrine 5 MG tablet Commonly known as: PROAMATINE Take 5 mg by mouth 3 (three) times daily with meals. Notify provider if BP is < 90/60.   mineral oil-hydrophilic petrolatum ointment Apply 1 application topically 2 (two) times daily as needed for dry skin. To face   [name] 100 MG Caps Generic drug: Droxidopa TAKE 1 CAPSULE BY MOUTH THREE TIMES DAILY. TAKE LAST DOSE AT LEAST 3 HOURS BEFORE BEDTIME. What changed:   how much to take  how to take this  when to take this  additional instructions   oxybutynin 10 MG 24 hr tablet Commonly known as: DITROPAN-XL Take 1 tablet (10 mg total) by mouth at bedtime.   pantoprazole 40 MG tablet Commonly known as: PROTONIX Take 40 mg by mouth daily.   Plecanatide 3 MG Tabs Commonly known as: Trulance  Take 3 mg by mouth daily.   polyethylene glycol 17 g packet Commonly known as: MIRALAX / GLYCOLAX Take 17 g by mouth daily as needed for mild constipation. Take 1-2 capfuls in 8 oz of H2O   pravastatin 40 MG tablet Commonly known as: PRAVACHOL Take 1 tablet (40 mg total) by mouth at bedtime.   rOPINIRole 0.25 MG tablet Commonly known as: REQUIP Take 0.5 mg by mouth 2 (two) times daily.   sennosides-docusate sodium 8.6-50 MG tablet Commonly known as: SENOKOT-S Take 2 tablets by mouth 2 (two) times daily.   torsemide 10 MG tablet Commonly known as: DEMADEX Take 20 mg by mouth daily. 20 mg once a day   Vitamin D3 125 MCG (5000 UT) Caps Take 5,000 Units by mouth every Monday.       Review of Systems  Constitutional: Positive for activity change and appetite change.    HENT: Negative.   Respiratory: Negative.   Cardiovascular: Negative.   Gastrointestinal: Positive for constipation.  Genitourinary: Negative.   Musculoskeletal: Positive for gait problem.  Skin: Negative.   Neurological: Positive for weakness.  Psychiatric/Behavioral: Negative.     Vitals:   09/09/19 1355  BP: 117/76  Pulse: 71  Resp: 18  Temp: (!) 97.4 F (36.3 C)  SpO2: 96%  Weight: 167 lb 1.6 oz (75.8 kg)  Height: 5\' 10"  (1.778 m)   Body mass index is 23.98 kg/m. Physical Exam Vitals reviewed.  Constitutional:      Appearance: Normal appearance.  HENT:     Head: Normocephalic.     Nose: Nose normal.     Mouth/Throat:     Mouth: Mucous membranes are moist.     Pharynx: Oropharynx is clear.  Eyes:     Pupils: Pupils are equal, round, and reactive to light.  Cardiovascular:     Rate and Rhythm: Normal rate and regular rhythm.     Pulses: Normal pulses.     Heart sounds: Murmur heard.   Pulmonary:     Effort: Pulmonary effort is normal. No respiratory distress.     Breath sounds: Normal breath sounds. No wheezing or rales.  Abdominal:     General: Abdomen is flat. Bowel sounds are normal. There is no distension.     Palpations: Abdomen is soft.     Tenderness: There is no abdominal tenderness.  Musculoskeletal:        General: No swelling.     Cervical back: Neck supple.  Skin:    General: Skin is warm.  Neurological:     Mental Status: He is alert and oriented to person, place, and time.  Psychiatric:        Mood and Affect: Mood normal.        Thought Content: Thought content normal.     Labs reviewed: Basic Metabolic Panel: Recent Labs    08/29/19 0233 08/29/19 0233 08/30/19 0253 08/30/19 0253 08/31/19 0205 09/01/19 0226 09/02/19 0514 09/03/19 0545  NA 137   < > 135   < > 137 138 138  --   K 3.7   < > 3.1*   < > 3.6 3.8 3.5  --   CL 102   < > 99   < > 102 99 95*  --   CO2 24   < > 27   < > 25 26 30   --   GLUCOSE 98   < > 104*   < > 114*  101* 112*  --   BUN 16   < >  15   < > 13 14 17   --   CREATININE 0.77   < > 0.72   < > 0.65 0.65 0.88  --   CALCIUM 8.1*   < > 7.7*   < > 8.4* 8.4* 8.5*  --   MG 2.7*  --   --   --  2.3  --   --  2.5*  PHOS 2.1*   < > 3.1  --  3.0  --   --  3.7   < > = values in this interval not displayed.   Liver Function Tests: Recent Labs    03/15/19 0000 08/27/19 0226 08/29/19 0233  AST 14 20 33  ALT 8* 12 18  ALKPHOS 75 53 55  BILITOT  --  0.8 0.7  PROT  --  5.7* 6.1*  ALBUMIN 3.5 2.7* 2.6*   No results for input(s): LIPASE, AMYLASE in the last 8760 hours. No results for input(s): AMMONIA in the last 8760 hours. CBC: Recent Labs    08/26/19 0111 08/26/19 0127 08/31/19 0205 09/01/19 0226 09/03/19 0545  WBC 17.2*   < > 8.9 8.0 7.8  NEUTROABS 13.2*  --   --   --   --   HGB 12.4*   < > 10.3* 10.2* 11.1*  HCT 37.2*   < > 32.1* 31.7* 34.1*  MCV 99.2   < > 98.5 97.5 96.1  PLT 303   < > 218 250 322   < > = values in this interval not displayed.   Cardiac Enzymes: No results for input(s): CKTOTAL, CKMB, CKMBINDEX, TROPONINI in the last 8760 hours. BNP: Invalid input(s): POCBNP Lab Results  Component Value Date   HGBA1C 5.8 (H) 08/26/2019   Lab Results  Component Value Date   TSH 1.71 03/15/2019   No results found for: VITAMINB12 No results found for: FOLATE Lab Results  Component Value Date   FERRITIN 88 10/11/2016    Imaging and Procedures obtained prior to SNF admission: CT HEAD WO CONTRAST  Result Date: 08/26/2019 CLINICAL DATA:  Altered mental status. EXAM: CT HEAD WITHOUT CONTRAST TECHNIQUE: Contiguous axial images were obtained from the base of the skull through the vertex without intravenous contrast. COMPARISON:  January 11, 2016. FINDINGS: Brain: Mild diffuse cortical atrophy is noted. Minimal chronic ischemic white matter disease is noted. No mass effect or midline shift is noted. Ventricular size is within normal limits. There is no evidence of mass lesion,  hemorrhage or acute infarction. Vascular: No hyperdense vessel or unexpected calcification. Skull: Normal. Negative for fracture or focal lesion. Sinuses/Orbits: No acute finding. Other: None. IMPRESSION: Mild diffuse cortical atrophy. Minimal chronic ischemic white matter disease. No acute intracranial abnormality seen. Electronically Signed   By: Marijo Conception M.D.   On: 08/26/2019 14:50   DG Chest Portable 1 View  Result Date: 08/26/2019 CLINICAL DATA:  History of pain. EXAM: PORTABLE CHEST 1 VIEW COMPARISON:  CT abdomen 01/18/2018.  Chest x-ray 01/18/2018. FINDINGS: Cardiac pacer in stable position. Prior CABG. Stable cardiomegaly. No pulmonary venous congestion. Large hiatal hernia again noted. No focal infiltrate noted. No pleural effusion or pneumothorax. IMPRESSION: 1. Cardiac pacer stable position. Prior CABG. Stable cardiomegaly. No acute abnormality identified. Chest is stable from prior exam. 2.  Large hiatal hernia again noted. Electronically Signed   By: Marcello Moores  Register   On: 08/26/2019 06:18   ECHOCARDIOGRAM COMPLETE  Result Date: 08/26/2019    ECHOCARDIOGRAM REPORT   Patient Name:   TREI SCHOCH Date  of Exam: 08/26/2019 Medical Rec #:  [id number]     Height:       70.0 in Accession #:    [id number]    Weight:       185.4 lb Date of Birth:  1926/05/12     BSA:          2.021 m Patient Age:    66 years      BP:           105/85 mmHg Patient Gender: M             HR:           70 bpm. Exam Location:  Inpatient Procedure: 2D Echo, Cardiac Doppler, Color Doppler and Intracardiac            Opacification Agent                     STAT ECHO Reported to: Dr Gardiner Rhyme on 08/26/2019 12:18:00 PM. Indications:    R94.31 Abnormal EKG  History:        Patient has prior history of Echocardiogram examinations, most                 recent 09/02/2017. CHF and Cardiomyopathy, Previous Myocardial                 Infarction and CAD, Pacemaker and Prior CABG, Stroke, Aortic                 Valve Disease,  Arrythmias:RBBB, Signs/Symptoms:Dyspnea; Risk                 Factors:Hypertension, Dyslipidemia and GERD. Sick Sinus                 Syndrome.  Sonographer:    Tiffany Dance Referring Phys: [id number] Harold Hedge  Sonographer Comments: No subcostal window. IMPRESSIONS  1. Left ventricular ejection fraction, by estimation, is 40 to 45%. The left ventricle has mildly decreased function. The left ventricle demonstrates regional wall motion abnormalities (see scoring diagram/findings for description). Basal to mid inferior/inferolateral akinesis. There is severe asymmetric left ventricular hypertrophy of the basal-septal segment. Left ventricular diastolic parameters are indeterminate.  2. Right ventricular systolic function is mildly reduced. The right ventricular size is mildly enlarged. Tricuspid regurgitation signal is inadequate for assessing PA pressure.  3. The mitral valve is abnormal. Moderately calcified leaflets. No evidence of mitral valve regurgitation.  4. Aortic dilatation noted. There is mild dilatation of the ascending aorta measuring 38 mm.  5. The aortic valve is abnormal. Severely calcified. Aortic valve regurgitation is mild. Severe aortic valve stenosis. Vmax 3.7 m/s, MG 35 mmHg, AVA 0.5 cm^2, DI 0.13. FINDINGS  Left Ventricle: Left ventricular ejection fraction, by estimation, is 40 to 45%. The left ventricle has mildly decreased function. The left ventricle demonstrates regional wall motion abnormalities. Definity contrast agent was given IV to delineate the left ventricular endocardial borders. The left ventricular internal cavity size was normal in size. There is severe asymmetric left ventricular hypertrophy of the basal-septal segment. Left ventricular diastolic parameters are indeterminate.  LV Wall Scoring: The inferior wall and posterior wall are akinetic. The mid inferoseptal segment and basal inferoseptal segment are hypokinetic. The entire anterior wall, antero-lateral wall, entire  anterior septum, and entire apex are normal. Right Ventricle: The right ventricular size is mildly enlarged. Right vetricular wall thickness was not assessed. Right ventricular systolic function is mildly reduced. Tricuspid regurgitation signal is inadequate for assessing PA pressure.  Left Atrium: Left atrial size was normal in size. Right Atrium: Right atrial size was normal in size. Pericardium: Trivial pericardial effusion is present. Mitral Valve: The mitral valve is abnormal. There is moderate calcification of the mitral valve leaflet(s). No evidence of mitral valve regurgitation. Tricuspid Valve: The tricuspid valve is grossly normal. Tricuspid valve regurgitation is trivial. Aortic Valve: The aortic valve is abnormal. Aortic valve regurgitation is mild. Aortic regurgitation PHT measures 365 msec. Severe aortic stenosis is present. There is severe calcifcation of the aortic valve. Aortic valve mean gradient measures 32.2 mmHg. Aortic valve peak gradient measures 50.8 mmHg. Aortic valve area, by VTI measures 0.56 cm. Pulmonic Valve: The pulmonic valve was not well visualized. Pulmonic valve regurgitation is not visualized. Aorta: The aortic root is normal in size and structure and aortic dilatation noted. There is mild dilatation of the ascending aorta measuring 38 mm. IAS/Shunts: The interatrial septum was not well visualized.  LEFT VENTRICLE PLAX 2D LVIDd:         4.24 cm LVIDs:         3.65 cm LV PW:         1.47 cm LV IVS:        1.42 cm LVOT diam:     2.40 cm LV SV:         45 LV SV Index:   22 LVOT Area:     4.52 cm  RIGHT VENTRICLE RV Basal diam:  3.35 cm RV Mid diam:    2.18 cm RV S prime:     6.41 cm/s TAPSE (M-mode): 1.5 cm LEFT ATRIUM             Index       RIGHT ATRIUM           Index LA diam:        3.00 cm 1.48 cm/m  RA Area:     19.40 cm LA Vol (A2C):   70.7 ml 34.98 ml/m RA Volume:   53.60 ml  26.52 ml/m LA Vol (A4C):   58.8 ml 29.09 ml/m LA Biplane Vol: 64.9 ml 32.11 ml/m  AORTIC VALVE  AV Area (Vmax):    0.65 cm AV Area (Vmean):   0.62 cm AV Area (VTI):     0.56 cm AV Vmax:           356.20 cm/s AV Vmean:          266.600 cm/s AV VTI:            0.804 m AV Peak Grad:      50.8 mmHg AV Mean Grad:      32.2 mmHg LVOT Vmax:         51.30 cm/s LVOT Vmean:        36.400 cm/s LVOT VTI:          0.099 m LVOT/AV VTI ratio: 0.12 AI PHT:            365 msec  AORTA Ao Root diam: 3.40 cm Ao Asc diam:  3.80 cm MITRAL VALVE MV Area (PHT): 3.37 cm    SHUNTS MV Decel Time: 225 msec    Systemic VTI:  0.10 m MV E velocity: 69.00 cm/s  Systemic Diam: 2.40 cm MV A velocity: 62.90 cm/s MV E/A ratio:  1.10 Oswaldo Milian MD Electronically signed by Oswaldo Milian MD Signature Date/Time: 08/26/2019/1:19:31 PM    Final    CT Renal Stone Study  Result Date: 08/26/2019 CLINICAL DATA:  Hematuria.  Lower abdominal  pain. EXAM: CT ABDOMEN AND PELVIS WITHOUT CONTRAST TECHNIQUE: Multidetector CT imaging of the abdomen and pelvis was performed following the standard protocol without IV contrast. COMPARISON:  01/18/2018 FINDINGS: Lower chest: A large hiatal hernia is again partially visualized with the majority of the stomach being located in the chest. There is associated compressive atelectasis in the left lower lobe. Trace left pleural effusion, smaller than on the prior study. Partially visualized pacemaker leads. Hepatobiliary: Subcentimeter hypodensity in the left hepatic lobe, too small to fully characterize. Unremarkable gallbladder. No biliary dilatation. Pancreas: Unremarkable. Spleen: Unremarkable. Adrenals/Urinary Tract: Unremarkable right adrenal gland. Unchanged slight left adrenal nodularity. Left renal calculi measuring up to 5 mm in the lower pole. Vascular calcifications in the right renal hilum. Left renal cysts including an unchanged 5.1 cm cyst in the lower pole. Mild bilateral hydronephrosis and right hydroureter. Moderate bladder distension. 11 cm hyperdense mass dependently in the bladder  with a small amount of gas likely reflecting a large hematoma. Foley catheter in place. Stomach/Bowel: There is no evidence of bowel obstruction or inflammation. 1 cm metallic density at the base of the cecum. Vascular/Lymphatic: Abdominal aortic atherosclerosis. Unchanged focal saccular dilatation of the infrarenal abdominal aorta with transverse diameter of 2.5 cm. No enlarged lymph nodes. Reproductive: Unremarkable prostate. Other: Mild nonspecific stranding adjacent to the bladder and in the presacral region. No fluid collection or pneumoperitoneum. Musculoskeletal: Chronic T11 compression fracture with progressive severe vertebral body height loss. Unchanged chronic L3 compression fracture with mild height loss. IMPRESSION: 1. Large bladder hematoma with Foley catheter in place. Stranding about the bladder could indicate hemorrhagic cystitis. An underlying mass is not excluded. Consider urology consultation. 2. Mild bilateral hydronephrosis. 3. Nonobstructing left nephrolithiasis. 4. Large hiatal hernia. 5. Aortic Atherosclerosis (ICD10-I70.0). Electronically Signed   By: Logan Bores M.D.   On: 08/26/2019 04:38    Assessment/Plan Hematuria, unspecified type Has chronic Foley His aspirin was discontinued in the hospital Needs follow-up with urology Continue on Ditropan Acute blood loss anemia Due to hematuria We will repeat CBC Combined systolic and diastolic congestive heart failure, unspecified HF chronicity (HCC) Continue on low-dose dose of Demadex Has severe aortic stenosis.  EF 40 to 45% Palliative care Ventricular tachycardia (HCC) Continue on amiodarone Hypothyroidism due to medication TSH normal in 2/21 RLS (restless legs syndrome) On Requip Restart as needed Requip also Depression, recurrent (HCC) Continue on Cymbalta  Neurogenic orthostatic hypotension (HCC) Blood pressure stable now on midodrine and northera Chronic constipation On Trulance We will get MiraLAX  today    Family/ staff Communication:   Labs/tests ordered:

## 2019-09-09 NOTE — Telephone Encounter (Signed)
Received message that SW at [facility] had called to request a Palliative care visit for patient. Reached out to SW. Amy NP to see patient on 09/14/19.

## 2019-09-14 ENCOUNTER — Other Ambulatory Visit: Payer: Self-pay

## 2019-09-14 ENCOUNTER — Non-Acute Institutional Stay: Payer: Medicare PPO | Admitting: Adult Health Nurse Practitioner

## 2019-09-14 DIAGNOSIS — Z515 Encounter for palliative care: Secondary | ICD-10-CM

## 2019-09-14 DIAGNOSIS — I350 Nonrheumatic aortic (valve) stenosis: Secondary | ICD-10-CM

## 2019-09-14 NOTE — Progress Notes (Signed)
[facility] Consult Note Telephone: [phone]  Fax: [phone]  PATIENT NAME: Adam [name] DOB: 05-04-1926 MRN: [id number]  PRIMARY CARE PROVIDER:   [name], MD  REFERRING PROVIDER:  [name], MD [place],  [place] [id number]  RESPONSIBLE PARTY:   Daughter Adam [name] [phone]    RECOMMENDATIONS and PLAN:  1.  Advanced care planning.  Patient is DNR.  Went over and filled out MOST form today.  MOST indicates limited hospital interventions, antibiotics and IV fluids as indicated, feeding tube for trial period.  2.  Functional status.  Patient is wheelchair-bound.  Is able to propel himself in wheelchair.  Requires assistance with ADLs.  Is able to feed himself.  This appears to be at baseline.  Continue supportive care at facility.  3.  Nutritional status.  Approximately a year and a half ago patient's appetite decreased and he went from 178 pounds to 164 pounds in 6 months.  Was on Remeron for a while when he lost his appetite and gained weight back up to 173 pounds.  Is not currently on Remeron.  Patient was 175 pounds on 08/25/2019.  After hospitalization on 09/09/2019 patient's weight was 167 pounds with a BMI of 23.98.  Has had some decreased appetite since being in the hospital but feels as if this is improving.  Continue supportive care at facility and add supplementation if needed for weight loss.  Palliative will continue to monitor for symptom management/decline and make recommendations as needed.  We will follow up in about 8 weeks  I spent 120 minutes providing this consultation,  from 2:00 to 4;00 including time spent with patient/family, chart review, provider coordination, documentation. More than 50% of the time in this consultation was spent coordinating communication.   HISTORY OF PRESENT ILLNESS:  Adam [name] is a 86 y.o. year old male with multiple medical problems including severe aortic  stenosis, CAD s/p CABG (1996), CHF, severe orthostatic hypotension on midodrine, HTN, HLD, hypothyroidism, chronic urinary retention with chronic Foley catheter in place. Palliative Care was asked to help address goals of care.  Patient was in ED on 08/14/2019 for occlusion of Foley catheter.  Was in ED on 08/24/2019 for hematuria due to obstruction of Foley with recommendation for follow-up with urology as outpatient.  On 7/21 through 09/03/2019 was hospitalized with continued hematuria and hypokalemia.  Was found to have hematoma of the bladder and underwent CBI.  This has cleared up and patient has referral for urology outpatient.  Patient has stable DOE.  Patient does state that he is sleeping a lot more.  Sleeps about 10 hours at night and 3 hours worth of naps during the day.  Does state that he has dizziness off and on.  Denies pain, chest pain, fever, increased shortness of breath or cough, N/V/D, constipation.  CODE STATUS: DNR  PPS: 30% HOSPICE ELIGIBILITY/DIAGNOSIS: TBD  PHYSICAL EXAM:   General: NAD, frail appearing Cardiovascular: regular rate and rhythm Pulmonary: Lung sounds clear; normal respiratory effort Abdomen: soft, nontender, + bowel sounds GU: Foley catheter in place with clear yellow urine noted in bag Extremities: Trace edema with more noted to right foot than left, no joint deformities Skin: no rashes on exposed skin Neurological: Weakness but otherwise nonfocal   PAST MEDICAL HISTORY:  Past Medical History:  Diagnosis Date  . Arthritis    "minor, back and sometimes knees" (12/15/2012)  . Bradycardia    SSS s/p  St Jude PPM 04/12/2008  . CAD (coronary artery disease) 12/30/2014   CABG (LIMA-LAD, SVG-RCA, SVG-OM in 1996).  07/2009 BMS to SVG-RCA. Cath in 04/2010 with patent stents   . Cardiomyopathy, ischemic 08/25/2012  . Chronic knee pain 12/03/2014  . Combined congestive systolic and diastolic heart failure ([place]) 02/02/2015   Hx EF 41%. BNP 96.8 02/21/15 Torsemide  04/06/15 Na 142, K 4.6, Bun 16, creat 0.89 04/20/14 BNP 111.7, Na 142, K 4.6, Bun 16, creat 0.9   . Depression with anxiety 02/02/2015   02/21/15 Hgb A1c 5.8 03/10/15 MMSE 30/30   . Dizziness, after diuretic asscoiated with hypotension and responded to fluid bolus 06/05/2011   04/28/15 US carotid R+L normal bilateral arterial velocities.    Marland Kitchen Dyspnea 08/11/2014   Followed in Pulmonary clinic/ [facility]/ [place]  - 08/11/2014  Walked RA x 1 laps @ 185 ft each stopped due to fatigue/off balance/ slow pace/  no sob or desat  - PFT's  09/26/2014  FEV1 2.26 (85 % ) ratio 76  p no % improvement from saba with DLCO  67 % corrects to 93 % for alv volume      Since prev study 08/04/13 minimal change lung vol or dlco    . 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 GERD (gastroesophageal reflux disease)   . Gout 02/09/2015  . Hiatal hernia   . Hyperlipidemia   . Hypertension   . Hypothyroid   . Influenza A 03/10/2016  . Insomnia   . Melanoma of back ([place]) 1976  . Myocardial infarction [facility]) 1996; 2011   "both silent" (12/15/2012)  . Nonrheumatic aortic valve stenosis   . Orthostatic hypotension   . Osteoporosis, senile   . Pacemaker   . RBBB   . Restless leg 02/02/2015  . Right leg weakness 12/06/2015  . [facility] spotted fever   . S/P CABG x 4   . Sick sinus syndrome ([place]) 01/31/2014  . Sustained ventricular tachycardia ([place]) 07/27/2014  . Urinary retention 12/30/2014   has a cathrater    SOCIAL HX:  Social History   Tobacco Use  . Smoking status: Never Smoker  . Smokeless tobacco: Never Used  Substance Use Topics  . Alcohol use: No    Alcohol/week: 0.0 standard drinks    ALLERGIES:  Allergies  Allergen Reactions  . Altace [Ramipril] Cough  . Crestor [Rosuvastatin Calcium] Rash  . Penicillins Rash and Other (See Comments)    Has patient had a PCN reaction causing immediate rash, facial/tongue/throat swelling, SOB or lightheadedness with hypotension: Yes Has patient had a PCN  reaction causing severe rash involving mucus membranes or skin necrosis: No Has patient had a PCN reaction that required hospitalization No Has patient had a PCN reaction occurring within the last 10 years: No If all of the above answers are "NO", then may proceed with Cephalosporin use.   . Sulfa Antibiotics Rash  . Sulfamethoxazole-Trimethoprim Rash     PERTINENT MEDICATIONS:  Outpatient Encounter Medications as of 09/14/2019  Medication Sig  . acetaminophen (TYLENOL) 500 MG tablet Take 500 mg by mouth 3 (three) times daily.   . Amino Acids-Protein Hydrolys (FEEDING SUPPLEMENT, PRO-STAT SUGAR FREE 64,) LIQD Take 30 mLs by mouth daily.   Marland Kitchen amiodarone (PACERONE) 100 MG tablet Take 1 tablet (100 mg total) by mouth daily.  . Cholecalciferol (VITAMIN D3) 5000 UNITS CAPS Take 5,000 Units by mouth every Monday.   . clotrimazole-betamethasone (LOTRISONE) cream Apply 1 application topically daily as needed.  Marland Kitchen  Droxidopa ([name]) 100 MG CAPS TAKE 1 CAPSULE BY MOUTH THREE TIMES DAILY. TAKE LAST DOSE AT LEAST 3 HOURS BEFORE BEDTIME. (Patient taking differently: Take 100 mg by mouth in the morning, at noon, and at bedtime. TAKE LAST DOSE AT LEAST 3 HOURS BEFORE BEDTIME.)  . DULoxetine (CYMBALTA) 20 MG capsule Take 20 mg by mouth daily. For anxiety,insomnia and back pain  . fluticasone (FLONASE) 50 MCG/ACT nasal spray Place 1 spray into both nostrils 2 (two) times daily.  Marland Kitchen guaiFENesin (ROBITUSSIN) 100 MG/5ML SOLN Take 5 mLs (100 mg total) by mouth every 4 (four) hours as needed for cough or to loosen phlegm.  Marland Kitchen ketoconazole (NIZORAL) 2 % cream Apply 1 application topically as needed for irritation. Apply small amount behind ears and in creases beside nose BID x 7 days. MAY REPEAT PRN. (Family supplied).  Marland Kitchen levothyroxine (SYNTHROID, LEVOTHROID) 100 MCG tablet Take 100 mcg by mouth daily before breakfast.  . meclizine (ANTIVERT) 25 MG tablet Take 1 tablet (25 mg total) by mouth 2 (two) times daily as  needed for dizziness.  . Melatonin 3 MG TABS Take 3 mg by mouth at bedtime.  . methocarbamol (ROBAXIN) 500 MG tablet Take 250 mg by mouth 2 (two) times daily as needed for muscle spasms.   . midodrine (PROAMATINE) 5 MG tablet Take 5 mg by mouth 3 (three) times daily with meals. Notify provider if BP is < 90/60.  Marland Kitchen mineral oil-hydrophilic petrolatum (AQUAPHOR) ointment Apply 1 application topically 2 (two) times daily as needed for dry skin. To face  . oxybutynin (DITROPAN-XL) 10 MG 24 hr tablet Take 1 tablet (10 mg total) by mouth at bedtime.  . pantoprazole (PROTONIX) 40 MG tablet Take 40 mg by mouth daily.  Marland Kitchen Plecanatide (TRULANCE) 3 MG TABS Take 3 mg by mouth daily.  . polyethylene glycol (MIRALAX / GLYCOLAX) 17 g packet Take 17 g by mouth daily as needed for mild constipation. Take 1-2 capfuls in 8 oz of H2O   . pravastatin (PRAVACHOL) 40 MG tablet Take 1 tablet (40 mg total) by mouth at bedtime.  Marland Kitchen rOPINIRole (REQUIP) 0.25 MG tablet Take 0.5 mg by mouth 2 (two) times daily.  . sennosides-docusate sodium (SENOKOT-S) 8.6-50 MG tablet Take 2 tablets by mouth 2 (two) times daily.   Marland Kitchen torsemide (DEMADEX) 10 MG tablet Take 20 mg by mouth daily. 20 mg once a day   No facility-administered encounter medications on file as of 09/14/2019.     Deaira Leckey Jenetta Downer, NP

## 2019-09-17 ENCOUNTER — Ambulatory Visit: Payer: Medicare PPO | Admitting: Adult Health

## 2019-10-04 NOTE — Progress Notes (Signed)
error 

## 2019-10-05 ENCOUNTER — Ambulatory Visit: Payer: Medicare PPO | Admitting: General Practice

## 2019-10-05 ENCOUNTER — Other Ambulatory Visit: Payer: Self-pay

## 2019-10-05 ENCOUNTER — Encounter: Payer: Self-pay | Admitting: General Practice

## 2019-10-05 VITALS — BP 102/60 | HR 70 | Temp 97.0°F | Ht 70.0 in | Wt 169.0 lb

## 2019-10-05 DIAGNOSIS — I25118 Atherosclerotic heart disease of native coronary artery with other forms of angina pectoris: Secondary | ICD-10-CM | POA: Diagnosis not present

## 2019-10-05 DIAGNOSIS — I959 Hypotension, unspecified: Secondary | ICD-10-CM

## 2019-10-05 DIAGNOSIS — E876 Hypokalemia: Secondary | ICD-10-CM

## 2019-10-05 DIAGNOSIS — I5042 Chronic combined systolic (congestive) and diastolic (congestive) heart failure: Secondary | ICD-10-CM

## 2019-10-05 DIAGNOSIS — Z79899 Other long term (current) drug therapy: Secondary | ICD-10-CM

## 2019-10-05 DIAGNOSIS — I472 Ventricular tachycardia, unspecified: Secondary | ICD-10-CM

## 2019-10-05 DIAGNOSIS — R319 Hematuria, unspecified: Secondary | ICD-10-CM

## 2019-10-05 NOTE — Patient Instructions (Addendum)
Medication Instructions:  Continue current medications  *If you need a refill on your cardiac medications before your next appointment, please call your pharmacy*   Lab Work: CBC and BMP  If you have labs (blood work) drawn today and your tests are completely normal, you will receive your results only by: Marland Kitchen MyChart Message (if you have MyChart) OR . A paper copy in the mail If you have any lab test that is abnormal or we need to change your treatment, we will call you to review the results.   Testing/Procedures: None Ordered   Follow-Up: At [facility], you and your health needs are our priority.  As part of our continuing mission to provide you with exceptional heart care, we have created designated Provider Care Teams.  These Care Teams include your primary Cardiologist (physician) and Advanced Practice Providers (APPs -  Physician Assistants and Nurse Practitioners) who all work together to provide you with the care you need, when you need it.  We recommend signing up for the patient portal called "MyChart".  Sign up information is provided on this After Visit Summary.  MyChart is used to connect with patients for Virtual Visits (Telemedicine).  Patients are able to view lab/test results, encounter notes, upcoming appointments, etc.  Non-urgent messages can be sent to your provider as well.   To learn more about what you can do with MyChart, go to [email or web address].    Your next appointment:   3 month(s)  The format for your next appointment:   In Person  Provider:   [name], MD or Coletta Memos, FNP

## 2019-10-05 NOTE — Progress Notes (Signed)
Cardiology Clinic Note   Patient Name: Adam [name] Date of Encounter: 10/05/2019  Primary Care Provider:  [name], MD Primary Cardiologist:  Adam [name], MD  Patient Profile    Adam [name] 86 year old male presents to the clinic today for follow-up evaluation of his mild anemia, hypotension and tachycardia.  Past Medical History    Past Medical History:  Diagnosis Date  . Arthritis    "minor, back and sometimes knees" (12/15/2012)  . Bradycardia    SSS s/p St Jude PPM 04/12/2008  . CAD (coronary artery disease) 12/30/2014   CABG (LIMA-LAD, SVG-RCA, SVG-OM in 1996).  07/2009 BMS to SVG-RCA. Cath in 04/2010 with patent stents   . Cardiomyopathy, ischemic 08/25/2012  . Chronic knee pain 12/03/2014  . Combined congestive systolic and diastolic heart failure ([place]) 02/02/2015   Hx EF 41%. BNP 96.8 02/21/15 Torsemide 04/06/15 Na 142, K 4.6, Bun 16, creat 0.89 04/20/14 BNP 111.7, Na 142, K 4.6, Bun 16, creat 0.9   . Depression with anxiety 02/02/2015   02/21/15 Hgb A1c 5.8 03/10/15 MMSE 30/30   . Dizziness, after diuretic asscoiated with hypotension and responded to fluid bolus 06/05/2011   04/28/15 US carotid R+L normal bilateral arterial velocities.    Marland Kitchen Dyspnea 08/11/2014   Followed in Pulmonary clinic/ [facility]/ [place]  - 08/11/2014  Walked RA x 1 laps @ 185 ft each stopped due to fatigue/off balance/ slow pace/  no sob or desat  - PFT's  09/26/2014  FEV1 2.26 (85 % ) ratio 76  p no % improvement from saba with DLCO  67 % corrects to 93 % for alv volume      Since prev study 08/04/13 minimal change lung vol or dlco    . Embolic cerebral infarction ([place]) 12/06/2015  . GERD (gastroesophageal reflux disease)   . Gout 02/09/2015  . Hiatal hernia   . Hyperlipidemia   . Hypertension   . Hypothyroid   . Influenza A 03/10/2016  . Insomnia   . Melanoma of back ([place]) 1976  . Myocardial infarction [facility]) 1996; 2011   "both silent" (12/15/2012)  . Nonrheumatic aortic valve stenosis   .  Orthostatic hypotension   . Osteoporosis, senile   . Pacemaker   . RBBB   . Restless leg 02/02/2015  . Right leg weakness 12/06/2015  . [facility] spotted fever   . S/P CABG x 4   . Sick sinus syndrome ([place]) 01/31/2014  . Sustained ventricular tachycardia ([place]) 07/27/2014  . Urinary retention 12/30/2014   has a cathrater   Past Surgical History:  Procedure Laterality Date  . CARDIAC CATHETERIZATION  04/2010   LIMA to LAD patent,SVG to OM patent,no in-stnet restenosis RCA  . CATARACT EXTRACTION W/ INTRAOCULAR LENS  IMPLANT, BILATERAL Bilateral 2012  . CORONARY ANGIOPLASTY WITH STENT PLACEMENT  07/2009   bare metal stent to SVG to the RCA  . CORONARY ARTERY BYPASS GRAFT  1996   LIMA to LAD,SVG to RCA & SVG to OM  . INSERT / REPLACE / REMOVE PACEMAKER  2010  . MELANOMA EXCISION  05/1974 X2   "taken off my back" (12/15/2012)  . NM MYOVIEW LTD  06/2011   low risk  . PPM GENERATOR CHANGEOUT N/A 01/22/2017   Procedure: PPM GENERATOR CHANGEOUT;  Surgeon: Adam [name], MD;  Location: [facility];  Service: Cardiovascular;  Laterality: N/A;  . TONSILLECTOMY  1938  . TRANSURETHRAL RESECTION OF PROSTATE  1986  . US ECHOCARDIOGRAPHY  07/11/2009  EF 45-50%    Allergies  Allergies  Allergen Reactions  . Altace [Ramipril] Cough  . Crestor [Rosuvastatin Calcium] Rash  . Penicillins Rash and Other (See Comments)    Has patient had a PCN reaction causing immediate rash, facial/tongue/throat swelling, SOB or lightheadedness with hypotension: Yes Has patient had a PCN reaction causing severe rash involving mucus membranes or skin necrosis: No Has patient had a PCN reaction that required hospitalization No Has patient had a PCN reaction occurring within the last 10 years: No If all of the above answers are "NO", then may proceed with Cephalosporin use.   . Sulfa Antibiotics Rash  . Sulfamethoxazole-Trimethoprim Rash    History of Present Illness    Adam [name] has a PMH of CHF,  coronary artery disease (status post CABG 1996) BMS to SVG/RCA 2011, PPM, and moderate AS.  He presented to [facility] on 08/26/2019 with frank hematuria.  This was associated with abdominal pain.  CT of his abdomen showed large bladder hematoma, possible hemorrhagic cyst, and could not exclude underlying mass.  Urology recommended irrigation.  He was felt to have an episode of V. tach which broke spontaneously.  His pacemaker was interrogated and showed sinus tachycardia with ventricular pacing and pacemaker mediated tachycardia.  His settings were readjusted.  His device was functioning normally, atrial dependent, RV pacing 27%.  He was placed on amiodarone.  His troponins were low and flat with no concern for ACS.  Echocardiogram showed severe aortic stenosis during admission.  His aunt platelet medication was held due to hematuria and anemia.  He was felt not to be a candidate for invasive procedures.  Adam [name] discussed palliative care consultation and DNR status.  He is confirmed to have DNR.  He presents to the clinic today for follow-up evaluation and states he feels fairly well.  He denies continue with fatigue.  He does indicate that he has occasional periods of dizziness that have been ongoing for several months.  He reports that he tries to stay fairly well-hydrated and is urinating well.  His wife indicates that he has been more tired and sleeping more since being home from the hospital.  His daughter joins on the phone and indicates that he has been to see the urologist and has a follow-up in 2 months.  She indicated that they mentioned cystoscopy but have not made any formal plans for the procedure.  He denies any bleeding issues and does not report any further history of hematuria.  I will order a BMP in and CBC today for further evaluation.  I will have him follow-up in 3 months.  Today he denies chest pain, shortness of breath, lower extremity edema, fatigue, palpitations, melena, hematuria, hemoptysis,  diaphoresis, weakness, presyncope, syncope, orthopnea, and PND.  Home Medications    Prior to Admission medications   Medication Sig Start Date End Date Taking? Authorizing Provider  acetaminophen (TYLENOL) 500 MG tablet Take 500 mg by mouth 3 (three) times daily.     [provider]  Amino Acids-Protein Hydrolys (FEEDING SUPPLEMENT, PRO-STAT SUGAR FREE 64,) LIQD Take 30 mLs by mouth daily.     [provider]  amiodarone (PACERONE) 100 MG tablet Take 1 tablet (100 mg total) by mouth daily. 01/16/17   Croitoru, Mihai, MD  Cholecalciferol (VITAMIN D3) 5000 UNITS CAPS Take 5,000 Units by mouth every Monday.     [provider]  clotrimazole-betamethasone (LOTRISONE) cream Apply 1 application topically daily as needed.    [provider]  Droxidopa ([name]) 100 MG CAPS TAKE 1 CAPSULE BY MOUTH THREE TIMES DAILY. TAKE LAST DOSE AT LEAST 3 HOURS BEFORE BEDTIME. Patient taking differently: Take 100 mg by mouth in the morning, at noon, and at bedtime. TAKE LAST DOSE AT LEAST 3 HOURS BEFORE BEDTIME. 12/02/18   Croitoru, Mihai, MD  DULoxetine (CYMBALTA) 20 MG capsule Take 20 mg by mouth daily. For anxiety,insomnia and back pain    [provider]  fluticasone (FLONASE) 50 MCG/ACT nasal spray Place 1 spray into both nostrils 2 (two) times daily.    [provider]  guaiFENesin (ROBITUSSIN) 100 MG/5ML SOLN Take 5 mLs (100 mg total) by mouth every 4 (four) hours as needed for cough or to loosen phlegm. 09/04/17   Lavina Hamman, MD  ketoconazole (NIZORAL) 2 % cream Apply 1 application topically as needed for irritation. Apply small amount behind ears and in creases beside nose BID x 7 days. MAY REPEAT PRN. (Family supplied).    [provider]  levothyroxine (SYNTHROID, LEVOTHROID) 100 MCG tablet Take 100 mcg by mouth daily before breakfast.    [provider]  meclizine (ANTIVERT) 25 MG tablet Take 1 tablet (25 mg total) by mouth 2 (two)  times daily as needed for dizziness. 09/04/17   Lavina Hamman, MD  Melatonin 3 MG TABS Take 3 mg by mouth at bedtime.    [provider]  methocarbamol (ROBAXIN) 500 MG tablet Take 250 mg by mouth 2 (two) times daily as needed for muscle spasms.     [provider]  midodrine (PROAMATINE) 5 MG tablet Take 5 mg by mouth 3 (three) times daily with meals. Notify provider if BP is < 90/60.    [provider]  mineral oil-hydrophilic petrolatum (AQUAPHOR) ointment Apply 1 application topically 2 (two) times daily as needed for dry skin. To face    [provider]  oxybutynin (DITROPAN-XL) 10 MG 24 hr tablet Take 1 tablet (10 mg total) by mouth at bedtime. 09/03/19   Shawna Clamp, MD  pantoprazole (PROTONIX) 40 MG tablet Take 40 mg by mouth daily.    [provider]  Plecanatide (TRULANCE) 3 MG TABS Take 3 mg by mouth daily. 01/22/18   Cirigliano, Vito V, DO  polyethylene glycol (MIRALAX / GLYCOLAX) 17 g packet Take 17 g by mouth daily as needed for mild constipation. Take 1-2 capfuls in 8 oz of H2O     [provider]  pravastatin (PRAVACHOL) 40 MG tablet Take 1 tablet (40 mg total) by mouth at bedtime. 10/21/16   Blanchie Serve, MD  rOPINIRole (REQUIP) 0.25 MG tablet Take 0.5 mg by mouth 2 (two) times daily.    [provider]  sennosides-docusate sodium (SENOKOT-S) 8.6-50 MG tablet Take 2 tablets by mouth 2 (two) times daily.     [provider]  torsemide (DEMADEX) 10 MG tablet Take 20 mg by mouth daily. 20 mg once a day    [provider]    Family History    Family History  Problem Relation Age of Onset  . Coronary artery disease Mother   . Diabetes Mother   . Heart disease Mother   . Coronary artery disease Father   . Diabetes Father   . Lung cancer Father   . Heart disease Father   . Anuerysm Son   . Heart disease Brother   . Colon cancer Neg Hx   . Esophageal cancer Neg Hx    He indicated that his  mother is deceased. He indicated that his father is deceased. He indicated that his sister is alive. He indicated that his brother is deceased. He indicated that his maternal grandmother is deceased. He indicated that his maternal grandfather is deceased. He indicated that his paternal grandmother is deceased. He indicated that his paternal grandfather is deceased. He indicated that his daughter is alive. He indicated that his son is deceased. He indicated that the status of his neg hx is unknown.  Social History    Social History   Socioeconomic History  . Marital status: Married    Spouse name: Not on file  . Number of children: 2  . Years of education: Masters  . Highest education level: Not on file  Occupational History  . Occupation: Retired Company secretary -Pensions consultant  Tobacco Use  . Smoking status: Never Smoker  . Smokeless tobacco: Never Used  Vaping Use  . Vaping Use: Never used  Substance and Sexual Activity  . Alcohol use: No    Alcohol/week: 0.0 standard drinks  . Drug use: No  . Sexual activity: Never  Other Topics Concern  . Not on file  Social History Narrative   Lives [facility] [facility] [place] [address]   Married - Violet   Never smoked   Alcohol none   Previously employed as Scientist, forensic for [employer].         Diet:Low sodium   Do you drink/eat things with caffeine? No   Marital status: Married                              What year were you married?1950   Do you live in a house, apartment, assisted living, condo, trailer, etc)?    Is it one or more stories? 1   How many persons live in your home? 2   Do you have any pets in your home? No   Current or past profession: Minister, [employer]iore[employer]   Do you exercise?     Very Little                                                 Type & how often:    Do you have a living will?  Yes   Do you have a DNR Form? Yes   Do you have a POA/HPOA forms? Yes   Social Determinants of Health    Financial Resource Strain:   . Difficulty of Paying Living Expenses: Not on file  Food Insecurity:   . Worried About Charity fundraiser in the Last Year: Not on file  . Ran Out of Food in the Last Year: Not on file  Transportation Needs:   . Lack of Transportation (Medical): Not on file  . Lack of Transportation (Non-Medical): Not on file  Physical Activity:   . Days of Exercise per Week: Not on file  . Minutes of Exercise per Session: Not on file  Stress:   . Feeling of Stress : Not on file  Social Connections:   . Frequency of Communication with Friends and Family: Not on file  . Frequency of Social Gatherings with Friends and Family: Not on file  . Attends Religious Services: Not on file  . Active Member of Clubs or Organizations: Not on file  .  Attends Archivist Meetings: Not on file  . Marital Status: Not on file  Intimate Partner Violence:   . Fear of Current or Ex-Partner: Not on file  . Emotionally Abused: Not on file  . Physically Abused: Not on file  . Sexually Abused: Not on file     Review of Systems    General:  No chills, fever, night sweats or weight changes.  Cardiovascular:  No chest pain, dyspnea on exertion, edema, orthopnea, palpitations, paroxysmal nocturnal dyspnea. Dermatological: No rash, lesions/masses Respiratory: No cough, dyspnea Urologic: No hematuria, dysuria Abdominal:   No nausea, vomiting, diarrhea, bright red blood per rectum, melena, or hematemesis Neurologic:  No visual changes, wkns, changes in mental status. All other systems reviewed and are otherwise negative except as noted above.  Physical Exam    VS:  BP 102/60   Pulse 70   Temp (!) 97 F (36.1 C)   Ht 5\' 10"  (1.778 m)   Wt 169 lb (76.7 kg)   BMI 24.25 kg/m  , BMI Body mass index is 24.25 kg/m. GEN: Well nourished, well developed, in no acute distress. HEENT: normal. Neck: Supple, no JVD, carotid bruits, or masses. Cardiac: RRR, no murmurs, rubs, or  gallops. No clubbing, cyanosis, edema.  Radials/DP/PT 2+ and equal bilaterally.  Respiratory:  Respirations regular and unlabored, clear to auscultation bilaterally. GI: Soft, nontender, nondistended, BS + x 4. MS: no deformity or atrophy. Skin: warm and dry, no rash. Neuro:  Strength and sensation are intact. Psych: Normal affect.  Accessory Clinical Findings    Recent Labs: 03/15/2019: TSH 1.71 08/29/2019: ALT 18 09/02/2019: BUN 17; Creatinine, Ser 0.88; Potassium 3.5; Sodium 138 09/03/2019: Hemoglobin 11.1; Magnesium 2.5; Platelets 322   Recent Lipid Panel    Component Value Date/Time   CHOL 93 03/30/2018 0000   TRIG 127 03/30/2018 0000   HDL 35 03/30/2018 0000   CHOLHDL 2.6 05/05/2017 0000   VLDL 20 12/07/2015 0449   LDLCALC 37 03/30/2018 0000   LDLCALC 43 05/05/2017 0000    ECG personally reviewed by me today-none today.  Echocardiogram 08/26/2019 IMPRESSIONS    1. Left ventricular ejection fraction, by estimation, is 40 to 45%. The  left ventricle has mildly decreased function. The left ventricle  demonstrates regional wall motion abnormalities (see scoring  diagram/findings for description). Basal to mid  inferior/inferolateral akinesis. There is severe asymmetric left  ventricular hypertrophy of the basal-septal segment. Left ventricular  diastolic parameters are indeterminate.  2. Right ventricular systolic function is mildly reduced. The right  ventricular size is mildly enlarged. Tricuspid regurgitation signal is  inadequate for assessing PA pressure.  3. The mitral valve is abnormal. Moderately calcified leaflets. No  evidence of mitral valve regurgitation.  4. Aortic dilatation noted. There is mild dilatation of the ascending  aorta measuring 38 mm.  5. The aortic valve is abnormal. Severely calcified. Aortic valve  regurgitation is mild. Severe aortic valve stenosis. Vmax 3.7 m/s, MG 35  mmHg, AVA 0.5 cm^2, DI 0.13.    Assessment & Plan   1.   Recurrent hematuria with bladder hematoma/hypotension/AKI/acute blood loss anemia-no subsequent episodes of pneumaturia.  Clopidogrel and aspirin held.  Received blood transfusion during 09/01/2019 admission. Followed by urology  Tachycardia-Hr today 70 bpm. No increased palpitations/rapid heart rate/irregular beats.  Noted to have sinus tachycardia with ventricular pacing response and pacemaker mediated tachycardia.  Pacemaker settings adjusted.  Device functioning normally, atrial pacing dependent, RV pacing 27% Continue amiodarone Heart healthy low-sodium diet-salty 6 given  Increase physical activity as tolerated Order BMP, CBC  Coronary artery disease-no chest pain today.  Status post CABG.  During admission troponins remained flat and not felt to be a rep presented above ACS.  Aspirin and clopidogrel held due to hematuria.  Intolerant of beta-blocker Continue pravastatin, Heart healthy low-sodium diet-salty 6 given Increase physical activity as tolerated   Chronic combined CHF-no increased dyspnea or work of breathing today.  Echocardiogram showed LVEF of 40-45%, severe aortic stenosis.  Not a candidate for invasive procedures. Continue torsemide  Hypokalemia-goal of 4.0 or greater potassium. Order BMP  Disposition: Follow-up with Dr. Sallyanne Kuster or me in 3 months.  Jossie Ng. Warnie Belair NP-C    10/05/2019, 10:29 AM [employer] [address] Office [phone] Fax [phone]  Notice: This dictation was prepared with Dragon dictation along with smaller phrase technology. Any transcriptional errors that result from this process are unintentional and may not be corrected upon review.

## 2019-10-05 NOTE — Progress Notes (Signed)
Clopidogrel 75 mg only please. No aspirin and no load. Thank you!

## 2019-10-08 ENCOUNTER — Encounter: Payer: Self-pay | Admitting: Nurse Practitioner

## 2019-10-08 ENCOUNTER — Non-Acute Institutional Stay (SKILLED_NURSING_FACILITY): Payer: Medicare PPO | Admitting: Nurse Practitioner

## 2019-10-08 DIAGNOSIS — M159 Polyosteoarthritis, unspecified: Secondary | ICD-10-CM

## 2019-10-08 DIAGNOSIS — F339 Major depressive disorder, recurrent, unspecified: Secondary | ICD-10-CM

## 2019-10-08 DIAGNOSIS — I5040 Unspecified combined systolic (congestive) and diastolic (congestive) heart failure: Secondary | ICD-10-CM

## 2019-10-08 DIAGNOSIS — I472 Ventricular tachycardia, unspecified: Secondary | ICD-10-CM

## 2019-10-08 DIAGNOSIS — G903 Multi-system degeneration of the autonomic nervous system: Secondary | ICD-10-CM | POA: Diagnosis not present

## 2019-10-08 DIAGNOSIS — E032 Hypothyroidism due to medicaments and other exogenous substances: Secondary | ICD-10-CM

## 2019-10-08 DIAGNOSIS — K219 Gastro-esophageal reflux disease without esophagitis: Secondary | ICD-10-CM

## 2019-10-08 DIAGNOSIS — K5909 Other constipation: Secondary | ICD-10-CM

## 2019-10-08 DIAGNOSIS — I495 Sick sinus syndrome: Secondary | ICD-10-CM | POA: Diagnosis not present

## 2019-10-08 DIAGNOSIS — R339 Retention of urine, unspecified: Secondary | ICD-10-CM

## 2019-10-08 DIAGNOSIS — D500 Iron deficiency anemia secondary to blood loss (chronic): Secondary | ICD-10-CM | POA: Insufficient documentation

## 2019-10-08 DIAGNOSIS — I2510 Atherosclerotic heart disease of native coronary artery without angina pectoris: Secondary | ICD-10-CM | POA: Diagnosis not present

## 2019-10-08 DIAGNOSIS — G2581 Restless legs syndrome: Secondary | ICD-10-CM

## 2019-10-08 NOTE — Progress Notes (Signed)
Location:   [facility] Room Number: 29 Place of Service:  SNF ([phone]) Provider:  Marlana Latus, NP  [name], MD  Patient Care Team: [name], MD as PCP - General (Internal Medicine) [name], MD as PCP - Cardiology (Cardiology) [name], MD as Attending Physician (Cardiology) Vevelyn Royals, MD as Consulting Physician (Ophthalmology) Franchot Gallo, MD as Consulting Physician (Urology) Tanda Rockers, MD as Consulting Physician (Pulmonary Disease) Allyn Kenner, MD (Dermatology) Deliah Goody, PA-C as Physician Assistant (Physician Assistant) Ashwin Tibbs X, NP as Nurse Practitioner (Internal Medicine) Ngetich, Nelda Bucks, NP as Nurse Practitioner (Family Medicine)  Extended Emergency Contact Information Primary Emergency Contact: [name] of [place] Phone: [phone] Relation: Daughter Secondary Emergency Contact: Gervais,[name] Address: [address]          [place] Phone: [phone] Mobile Phone: [phone] Relation: Spouse  Code Status:  DNR Goals of care: Advanced Directive information Advanced Directives 10/08/2019  Does Patient Have a Medical Advance Directive? Yes  Type of Paramedic of [place];Out of facility DNR (pink MOST or yellow form)  Does patient want to make changes to medical advance directive? No - Patient declined  Copy of [place] in Chart? -  Would patient like information on creating a medical advance directive? -  Pre-existing out of facility DNR order (yellow form or pink MOST form) Pink MOST form placed in chart (order not valid for inpatient use);Yellow form placed in chart (order not valid for inpatient use)     Chief Complaint  Patient presents with  . Medical Management of Chronic Issues    Routine follow up visit.  Marland Kitchen Best Practice Recommendations    Flu vaccine    HPI:  Pt is a  86 y.o. male seen today for medical management of chronic diseases.      Hx of Afib/SSS s/p St Jude PPM 2010, ventricular tachycardia, takes Amiodarone,                Depression, takes Duloxetine 20mg  qd             CAD, CABG x4, stents, off ASA due to hematuria.              CHF, takes Torsemide, EF 35-40%,              OA knee pain, left lower back, takes tylenol, Robaxin.              GERD takes Pantoprazole             Hypothyroidism, takes Levothyroxine.              Orthostatic hypotension, neurogenic, tales Droxidopa, Midodrine.              RLS, takes Ropinirole             Constipation, takes Trulance, Senokot S, MiraLax  Urinary retention, chronic indwelling cath, takes Oxybutynin.   Blood loss anemia, s/p 3 units PRBC transfusion, Hgb 11.1 09/03/19  Past Medical History:  Diagnosis Date  . Arthritis    "minor, back and sometimes knees" (12/15/2012)  . Bradycardia    SSS s/p St Jude PPM 04/12/2008  . CAD (coronary artery disease) 12/30/2014   CABG (LIMA-LAD, SVG-RCA, SVG-OM in 1996).  07/2009 BMS to SVG-RCA. Cath in 04/2010 with patent stents   . Cardiomyopathy, ischemic 08/25/2012  . Chronic knee pain 12/03/2014  .  Combined congestive systolic and diastolic heart failure ([place]) 02/02/2015   Hx EF 41%. BNP 96.8 02/21/15 Torsemide 04/06/15 Na 142, K 4.6, Bun 16, creat 0.89 04/20/14 BNP 111.7, Na 142, K 4.6, Bun 16, creat 0.9   . Depression with anxiety 02/02/2015   02/21/15 Hgb A1c 5.8 03/10/15 MMSE 30/30   . Dizziness, after diuretic asscoiated with hypotension and responded to fluid bolus 06/05/2011   04/28/15 US carotid R+L normal bilateral arterial velocities.    Marland Kitchen Dyspnea 08/11/2014   Followed in Pulmonary clinic/ [facility]/ [place]  - 08/11/2014  Walked RA x 1 laps @ 185 ft each stopped due to fatigue/off balance/ slow pace/  no sob or desat  - PFT's  09/26/2014  FEV1 2.26 (85 % ) ratio 76  p no % improvement from saba with DLCO  67 % corrects to 93 % for alv volume      Since prev  study 08/04/13 minimal change lung vol or dlco    . Embolic cerebral infarction ([place]) 12/06/2015  . GERD (gastroesophageal reflux disease)   . Gout 02/09/2015  . Hiatal hernia   . Hyperlipidemia   . Hypertension   . Hypothyroid   . Influenza A 03/10/2016  . Insomnia   . Melanoma of back ([place]) 1976  . Myocardial infarction [facility]) 1996; 2011   "both silent" (12/15/2012)  . Nonrheumatic aortic valve stenosis   . Orthostatic hypotension   . Osteoporosis, senile   . Pacemaker   . RBBB   . Restless leg 02/02/2015  . Right leg weakness 12/06/2015  . [facility] spotted fever   . S/P CABG x 4   . Sick sinus syndrome ([place]) 01/31/2014  . Sustained ventricular tachycardia ([place]) 07/27/2014  . Urinary retention 12/30/2014   has a cathrater   Past Surgical History:  Procedure Laterality Date  . CARDIAC CATHETERIZATION  04/2010   LIMA to LAD patent,SVG to OM patent,no in-stnet restenosis RCA  . CATARACT EXTRACTION W/ INTRAOCULAR LENS  IMPLANT, BILATERAL Bilateral 2012  . CORONARY ANGIOPLASTY WITH STENT PLACEMENT  07/2009   bare metal stent to SVG to the RCA  . CORONARY ARTERY BYPASS GRAFT  1996   LIMA to LAD,SVG to RCA & SVG to OM  . INSERT / REPLACE / REMOVE PACEMAKER  2010  . MELANOMA EXCISION  05/1974 X2   "taken off my back" (12/15/2012)  . NM MYOVIEW LTD  06/2011   low risk  . PPM GENERATOR CHANGEOUT N/A 01/22/2017   Procedure: PPM GENERATOR CHANGEOUT;  Surgeon: [name], MD;  Location: [facility] CV LAB;  Service: Cardiovascular;  Laterality: N/A;  . TONSILLECTOMY  1938  . TRANSURETHRAL RESECTION OF PROSTATE  1986  . US ECHOCARDIOGRAPHY  07/11/2009   EF 45-50%    Allergies  Allergen Reactions  . Altace [Ramipril] Cough  . Crestor [Rosuvastatin Calcium] Rash  . Penicillins Rash and Other (See Comments)    Has patient had a PCN reaction causing immediate rash, facial/tongue/throat swelling, SOB or lightheadedness with hypotension: Yes Has patient had a PCN reaction causing  severe rash involving mucus membranes or skin necrosis: No Has patient had a PCN reaction that required hospitalization No Has patient had a PCN reaction occurring within the last 10 years: No If all of the above answers are "NO", then may proceed with Cephalosporin use.   . Sulfa Antibiotics Rash  . Sulfamethoxazole-Trimethoprim Rash    Allergies as of 10/08/2019      Reactions   Altace [ramipril] Cough  Crestor [rosuvastatin Calcium] Rash   Penicillins Rash, Other (See Comments)   Has patient had a PCN reaction causing immediate rash, facial/tongue/throat swelling, SOB or lightheadedness with hypotension: Yes Has patient had a PCN reaction causing severe rash involving mucus membranes or skin necrosis: No Has patient had a PCN reaction that required hospitalization No Has patient had a PCN reaction occurring within the last 10 years: No If all of the above answers are "NO", then may proceed with Cephalosporin use.   Sulfa Antibiotics Rash   Sulfamethoxazole-trimethoprim Rash      Medication List       Accurate as of October 08, 2019 11:59 PM. If you have any questions, ask your nurse or doctor.        acetaminophen 500 MG tablet Commonly known as: TYLENOL Take 500 mg by mouth 3 (three) times daily.   amiodarone 100 MG tablet Commonly known as: PACERONE Take 1 tablet (100 mg total) by mouth daily.   clotrimazole-betamethasone cream Commonly known as: LOTRISONE Apply 1 application topically daily as needed.   DULoxetine 20 MG capsule Commonly known as: CYMBALTA Take 20 mg by mouth daily. For anxiety,insomnia and back pain   feeding supplement (PRO-STAT SUGAR FREE 64) Liqd Take 30 mLs by mouth daily.   fluticasone 50 MCG/ACT nasal spray Commonly known as: FLONASE Place 1 spray into both nostrils 2 (two) times daily.   guaiFENesin 100 MG/5ML Soln Commonly known as: ROBITUSSIN Take 5 mLs (100 mg total) by mouth every 4 (four) hours as needed for cough or to loosen  phlegm.   ketoconazole 2 % cream Commonly known as: NIZORAL Apply 1 application topically as needed for irritation. Apply small amount behind ears and in creases beside nose BID x 7 days. MAY REPEAT PRN. (Family supplied).   levothyroxine 100 MCG tablet Commonly known as: SYNTHROID Take 100 mcg by mouth daily before breakfast.   meclizine 25 MG tablet Commonly known as: ANTIVERT Take 1 tablet (25 mg total) by mouth 2 (two) times daily as needed for dizziness.   melatonin 3 MG Tabs tablet Take 3 mg by mouth at bedtime.   methocarbamol 500 MG tablet Commonly known as: ROBAXIN Take 250 mg by mouth 2 (two) times daily as needed for muscle spasms.   midodrine 5 MG tablet Commonly known as: PROAMATINE Take 5 mg by mouth 3 (three) times daily with meals. Notify provider if BP is < 90/60.   mineral oil-hydrophilic petrolatum ointment Apply 1 application topically 2 (two) times daily as needed for dry skin. To face   [name] 100 MG Caps Generic drug: Droxidopa TAKE 1 CAPSULE BY MOUTH THREE TIMES DAILY. TAKE LAST DOSE AT LEAST 3 HOURS BEFORE BEDTIME. What changed:   how much to take  how to take this  when to take this  additional instructions   oxybutynin 10 MG 24 hr tablet Commonly known as: DITROPAN-XL Take 1 tablet (10 mg total) by mouth at bedtime.   pantoprazole 40 MG tablet Commonly known as: PROTONIX Take 40 mg by mouth daily.   Plecanatide 3 MG Tabs Commonly known as: Trulance Take 3 mg by mouth daily.   polyethylene glycol 17 g packet Commonly known as: MIRALAX / GLYCOLAX Take 17 g by mouth daily as needed for mild constipation. Take 1-2 capfuls in 8 oz of H2O   pravastatin 40 MG tablet Commonly known as: PRAVACHOL Take 1 tablet (40 mg total) by mouth at bedtime.   rOPINIRole 0.25 MG tablet Commonly known as: REQUIP  Take 0.5 mg by mouth 2 (two) times daily.   rOPINIRole 0.25 MG tablet Commonly known as: REQUIP Take 0.25 mg by mouth. 0.25mg , oral,  Once A Day - PRN for Restless leg   sennosides-docusate sodium 8.6-50 MG tablet Commonly known as: SENOKOT-S Take 2 tablets by mouth 2 (two) times daily.   torsemide 10 MG tablet Commonly known as: DEMADEX Take 20 mg by mouth daily. 20 mg once a day   Vitamin D3 125 MCG (5000 UT) Caps Take 5,000 Units by mouth every Monday.       Review of Systems  Constitutional: Negative for fatigue, fever and unexpected weight change.       Weight loss #4Ibs in the past month  HENT: Positive for hearing loss. Negative for congestion and voice change.   Eyes: Negative for visual disturbance.  Respiratory: Positive for cough and shortness of breath.        Chronic DOE, cough  Cardiovascular: Positive for leg swelling. Negative for chest pain and palpitations.  Gastrointestinal: Negative for abdominal pain and constipation.  Genitourinary: Positive for difficulty urinating. Negative for dysuria and hematuria.       Foley  Musculoskeletal: Positive for arthralgias, back pain and gait problem.       Right shoulder limited over head ROM with pain.  Positional lower back pain is chronic, chronic knee pain  Skin: Negative for color change.  Neurological: Negative for speech difficulty, weakness and light-headedness.       Memory lapses. Mild expressive aphasia.   Psychiatric/Behavioral: Negative for behavioral problems and sleep disturbance. The patient is not nervous/anxious.     Immunization History  Administered Date(s) Administered  . Influenza, High Dose Seasonal PF 11/01/2014, 11/13/2016, 11/19/2018, 11/19/2018  . Influenza,inj,Quad PF,6+ Mos 11/24/2017  . Influenza,inj,quad, With Preservative 11/17/2017  . Influenza-Unspecified 12/05/2013, 11/05/2014, 11/16/2015  . Moderna SARS-COVID-2 Vaccination 02/08/2019, 03/08/2019  . PPD Test 03/07/2014  . Pneumococcal Conjugate-13 10/05/2009  . Pneumococcal Polysaccharide-23 11/05/2005  . Pneumococcal-Unspecified 10/05/2009  . Tdap 08/02/2014    . Zoster 11/05/2005   Pertinent  Health Maintenance Due  Topic Date Due  . INFLUENZA VACCINE  09/05/2019  . DEXA SCAN  12/14/2024  . PNA vac Low Risk Adult  Completed   Fall Risk  10/09/2017 10/02/2016 10/30/2015 09/05/2015 02/02/2015  Falls in the past year? No No No No No  Risk for fall due to : - - Impaired balance/gait - -   Functional Status Survey:    Vitals:   10/08/19 1001  BP: 116/68  Pulse: 70  Resp: 20  Temp: 97.6 F (36.4 C)  SpO2: 96%  Weight: 170 lb 1.6 oz (77.2 kg)  Height: 5\' 10"  (1.778 m)   Body mass index is 24.41 kg/m. Physical Exam Vitals and nursing note reviewed.  Constitutional:      Appearance: Normal appearance.  HENT:     Head: Normocephalic and atraumatic.     Nose: Nose normal.     Mouth/Throat:     Mouth: Mucous membranes are moist.  Eyes:     Extraocular Movements: Extraocular movements intact.     Conjunctiva/sclera: Conjunctivae normal.     Pupils: Pupils are equal, round, and reactive to light.  Cardiovascular:     Rate and Rhythm: Normal rate and regular rhythm.     Heart sounds: Murmur heard.      Comments: Left upper chest pace maker.  Pulmonary:     Breath sounds: No rales.  Abdominal:     General: Bowel sounds  are normal.     Palpations: Abdomen is soft.     Tenderness: There is no abdominal tenderness.  Genitourinary:    Comments: Foley, dark blood urine in Foley catheter bag Musculoskeletal:     Cervical back: Normal range of motion and neck supple.     Right lower leg: Edema present.     Left lower leg: Edema present.     Comments: Trace edema BLE. Reduced over head ROM of the right shoulder.  Positional lower back pain remains no change.   Skin:    General: Skin is warm and dry.     Findings: No bruising.  Neurological:     General: No focal deficit present.     Mental Status: He is alert. Mental status is at baseline.     Gait: Gait abnormal.     Comments: Oriented to person, place.   Psychiatric:        Mood  and Affect: Mood normal.        Behavior: Behavior normal.        Thought Content: Thought content normal.     Labs reviewed: Recent Labs    08/29/19 0233 08/29/19 0233 08/30/19 0253 08/30/19 0253 08/31/19 0205 09/01/19 0226 09/02/19 0514 09/03/19 0545  NA 137   < > 135   < > 137 138 138  --   K 3.7   < > 3.1*   < > 3.6 3.8 3.5  --   CL 102   < > 99   < > 102 99 95*  --   CO2 24   < > 27   < > 25 26 30   --   GLUCOSE 98   < > 104*   < > 114* 101* 112*  --   BUN 16   < > 15   < > 13 14 17   --   CREATININE 0.77   < > 0.72   < > 0.65 0.65 0.88  --   CALCIUM 8.1*   < > 7.7*   < > 8.4* 8.4* 8.5*  --   MG 2.7*  --   --   --  2.3  --   --  2.5*  PHOS 2.1*   < > 3.1  --  3.0  --   --  3.7   < > = values in this interval not displayed.   Recent Labs    03/15/19 0000 08/27/19 0226 08/29/19 0233  AST 14 20 33  ALT 8* 12 18  ALKPHOS 75 53 55  BILITOT  --  0.8 0.7  PROT  --  5.7* 6.1*  ALBUMIN 3.5 2.7* 2.6*   Recent Labs    08/26/19 0111 08/26/19 0127 08/31/19 0205 09/01/19 0226 09/03/19 0545  WBC 17.2*   < > 8.9 8.0 7.8  NEUTROABS 13.2*  --   --   --   --   HGB 12.4*   < > 10.3* 10.2* 11.1*  HCT 37.2*   < > 32.1* 31.7* 34.1*  MCV 99.2   < > 98.5 97.5 96.1  PLT 303   < > 218 250 322   < > = values in this interval not displayed.   Lab Results  Component Value Date   TSH 1.71 03/15/2019   Lab Results  Component Value Date   HGBA1C 5.8 (H) 08/26/2019   Lab Results  Component Value Date   CHOL 93 03/30/2018   HDL 35 03/30/2018   LDLCALC 37 03/30/2018  TRIG 127 03/30/2018   CHOLHDL 2.6 05/05/2017    Significant Diagnostic Results in last 30 days:  No results found.  Assessment/Plan Combined congestive systolic and diastolic heart failure (HCC) Compensated clinically, takes Torsemide, EF 35-40%,    Coronary artery disease involving native coronary artery of native heart without angina pectoris CABG x4, stents, off ASA due to hematuria.   Neurogenic  orthostatic hypotension (HCC)  tales Droxidopa, Midodrine.    SSS (sick sinus syndrome) (HCC) Hx of Afib/SSS s/p St Jude PPM 2010, ventricular tachycardia, takes Amiodarone,      Ventricular tachycardia (HCC) Heart rate is in control, continue Amiodarone.   Chronic constipation  takes Trulance, Senokot S, MiraLax   GERD (gastroesophageal reflux disease) Stable, continue Pantoprazole.   Hypothyroidism TSH 1.71 03/15/19, continue Levothyroxine.   Osteoarthritis knee pain, left lower back, takes tylenol, Robaxin.    Urinary retention chronic indwelling cath, takes Oxybutynin.    Depression, recurrent ([place]) His mood is stable, continue Duloxetine.   RLS (restless legs syndrome) Stable, continue Ropinirole.   Blood loss anemia Blood loss anemia, s/p 3 units PRBC transfusion, Hgb 11.1 09/03/19      Family/ staff Communication: plan of care reviewed with the patient and charge nurse.   Labs/tests ordered:  none  Time spend 35 minutes.

## 2019-10-08 NOTE — Assessment & Plan Note (Signed)
TSH 1.71 03/15/19, continue Levothyroxine.

## 2019-10-08 NOTE — Assessment & Plan Note (Signed)
chronic indwelling cath, takes Oxybutynin.

## 2019-10-08 NOTE — Assessment & Plan Note (Signed)
knee pain, left lower back, takes tylenol, Robaxin.

## 2019-10-08 NOTE — Assessment & Plan Note (Signed)
Compensated clinically, takes Torsemide, EF 35-40%,

## 2019-10-08 NOTE — Assessment & Plan Note (Signed)
tales Droxidopa, Midodrine.

## 2019-10-08 NOTE — Assessment & Plan Note (Signed)
Stable, continue Pantoprazole.  

## 2019-10-08 NOTE — Assessment & Plan Note (Signed)
Stable, continue Ropinirole.

## 2019-10-08 NOTE — Assessment & Plan Note (Signed)
His mood is stable, continue Duloxetine.

## 2019-10-08 NOTE — Assessment & Plan Note (Signed)
CABG x4, stents, off ASA due to hematuria.

## 2019-10-08 NOTE — Assessment & Plan Note (Signed)
Hx of Afib/SSS s/p St Jude PPM 2010, ventricular tachycardia, takes Amiodarone,

## 2019-10-08 NOTE — Assessment & Plan Note (Signed)
takes Trulance, Senokot S, MiraLax

## 2019-10-08 NOTE — Assessment & Plan Note (Signed)
Heart rate is in control, continue Amiodarone.

## 2019-10-08 NOTE — Assessment & Plan Note (Signed)
Blood loss anemia, s/p 3 units PRBC transfusion, Hgb 11.1 09/03/19

## 2019-10-18 LAB — LIPID PANEL
Cholesterol: 84 (ref 0–200)
HDL: 32 — AB (ref 35–70)
LDL Cholesterol: 31
LDl/HDL Ratio: 2.6
Triglycerides: 133 (ref 40–160)

## 2019-10-20 ENCOUNTER — Telehealth: Payer: Self-pay | Admitting: *Deleted

## 2019-10-20 NOTE — Telephone Encounter (Signed)
-----   Message from Deberah Pelton, NP sent at 10/05/2019 11:31 AM EDT ----- Regarding: Plavix for Mr. Wettstein  Please contact Mr. Roser and let him know that Dr. Sallyanne Kuster and I have discussed his antiplatelet medication.  We will start him back on his Plavix at 75 mg daily.  We will not restart his aspirin.  Please have him continue to monitor for any bleeding.  Thank you. ----- Message ----- From: [name], MD Sent: 10/05/2019  11:00 AM EDT To: Deberah Pelton, NP    ----- Message ----- From: Deberah Pelton, NP Sent: 10/05/2019  10:51 AM EDT To: [name], MD  Checking CBC and BMP here.  Did you want to restart Plavix, ASA, or both at this time? Thank you.

## 2019-10-20 NOTE — Telephone Encounter (Signed)
Spoke with nurse at [facility] and advised them to restart Clopidogrel 75 mg by mouth daily

## 2019-11-02 ENCOUNTER — Encounter: Payer: Self-pay | Admitting: Internal Medicine

## 2019-11-02 ENCOUNTER — Non-Acute Institutional Stay (SKILLED_NURSING_FACILITY): Payer: Medicare PPO | Admitting: Internal Medicine

## 2019-11-02 DIAGNOSIS — I5040 Unspecified combined systolic (congestive) and diastolic (congestive) heart failure: Secondary | ICD-10-CM

## 2019-11-02 DIAGNOSIS — K5909 Other constipation: Secondary | ICD-10-CM

## 2019-11-02 DIAGNOSIS — I2510 Atherosclerotic heart disease of native coronary artery without angina pectoris: Secondary | ICD-10-CM

## 2019-11-02 DIAGNOSIS — R630 Anorexia: Secondary | ICD-10-CM | POA: Diagnosis not present

## 2019-11-02 DIAGNOSIS — G903 Multi-system degeneration of the autonomic nervous system: Secondary | ICD-10-CM

## 2019-11-02 NOTE — Progress Notes (Signed)
Location:    [facility] Room Number: 29 Place of Service:  SNF [phone]) Provider:  Veleta Miners MD  [name], MD  Patient Care Team: [name], MD as PCP - General (Internal Medicine) [name], MD as PCP - Cardiology (Cardiology) [name], MD as Attending Physician (Cardiology) Vevelyn Royals, MD as Consulting Physician (Ophthalmology) Franchot Gallo, MD as Consulting Physician (Urology) Tanda Rockers, MD as Consulting Physician (Pulmonary Disease) Allyn Kenner, MD (Dermatology) Deliah Goody, PA-C as Physician Assistant (Physician Assistant) Mast, Man X, NP as Nurse Practitioner (Internal Medicine) Ngetich, Nelda Bucks, NP as Nurse Practitioner (Family Medicine)  Extended Emergency Contact Information Primary Emergency Contact: [name] of [place] Phone: [phone] Relation: Daughter Secondary Emergency Contact: Biffle,[name] Address: [address]          [place] Phone: [phone] Mobile Phone: [phone] Relation: Spouse  Code Status:  DNR Goals of care: Advanced Directive information Advanced Directives 10/08/2019  Does Patient Have a Medical Advance Directive? Yes  Type of Paramedic of [place];Out of facility DNR (pink MOST or yellow form)  Does patient want to make changes to medical advance directive? No - Patient declined  Copy of [place] in Chart? -  Would patient like information on creating a medical advance directive? -  Pre-existing out of facility DNR order (yellow form or pink MOST form) Pink MOST form placed in chart (order not valid for inpatient use);Yellow form placed in chart (order not valid for inpatient use)     Chief Complaint  Patient presents with  . Acute Visit    Poor appetite    HPI:  Pt is a 86 y.o. male seen today for an acute visit for Poor Appetite and Consideration of  remeron per Daughters Request  He has h/o CAD s/p Bypass surgery with Extensive Inferior Wall Scarring, Ischemic Cardiomyopathy withsystolic heart failureEF of 35-40%Moderate AS,history of ventricular tachycardia stable on amiodarone, sinus node dysfunction with dual-chamber PPP, severe orthostatic hypotension neurogenic,history of compression fractures, history of ischemic stroke in the past on dual therapy.,History of chronic indwelling Foley catheter He was admitted in the hospital from 7 21-7 30 with hematuria, sepsis, tachycardia and hypotension. Was found to have Tachycardia with VT, and Severe AS Cardiology Recommended Palliative care  He has been stable here. Says had poor appetite but is better now. Working with dietician for Supplements Has lost 10 lbs Appetite coming back Labs in 8 /21 were in good limits  Past Medical History:  Diagnosis Date  . Arthritis    "minor, back and sometimes knees" (12/15/2012)  . Bradycardia    SSS s/p St Jude PPM 04/12/2008  . CAD (coronary artery disease) 12/30/2014   CABG (LIMA-LAD, SVG-RCA, SVG-OM in 1996).  07/2009 BMS to SVG-RCA. Cath in 04/2010 with patent stents   . Cardiomyopathy, ischemic 08/25/2012  . Chronic knee pain 12/03/2014  . Combined congestive systolic and diastolic heart failure ([place]) 02/02/2015   Hx EF 41%. BNP 96.8 02/21/15 Torsemide 04/06/15 Na 142, K 4.6, Bun 16, creat 0.89 04/20/14 BNP 111.7, Na 142, K 4.6, Bun 16, creat 0.9   . Depression with anxiety 02/02/2015   02/21/15 Hgb A1c 5.8 03/10/15 MMSE 30/30   . Dizziness, after diuretic asscoiated with hypotension and responded to fluid bolus 06/05/2011   04/28/15 US carotid R+L normal bilateral arterial velocities.    Marland Kitchen Dyspnea 08/11/2014   Followed in  Pulmonary clinic/ [facility]/ [facility]  - 08/11/2014  Walked RA x 1 laps @ 185 ft each stopped due to fatigue/off balance/ slow pace/  no sob or desat  - PFT's  09/26/2014  FEV1 2.26 (85 % ) ratio 76  p no % improvement from saba  with DLCO  67 % corrects to 93 % for alv volume      Since prev study 08/04/13 minimal change lung vol or dlco    . Embolic cerebral infarction ([place]) 12/06/2015  . GERD (gastroesophageal reflux disease)   . Gout 02/09/2015  . Hiatal hernia   . Hyperlipidemia   . Hypertension   . Hypothyroid   . Influenza A 03/10/2016  . Insomnia   . Melanoma of back ([place]) 1976  . Myocardial infarction [facility]) 1996; 2011   "both silent" (12/15/2012)  . Nonrheumatic aortic valve stenosis   . Orthostatic hypotension   . Osteoporosis, senile   . Pacemaker   . RBBB   . Restless leg 02/02/2015  . Right leg weakness 12/06/2015  . [facility] spotted fever   . S/P CABG x 4   . Sick sinus syndrome ([place]) 01/31/2014  . Sustained ventricular tachycardia ([place]) 07/27/2014  . Urinary retention 12/30/2014   has a cathrater   Past Surgical History:  Procedure Laterality Date  . CARDIAC CATHETERIZATION  04/2010   LIMA to LAD patent,SVG to OM patent,no in-stnet restenosis RCA  . CATARACT EXTRACTION W/ INTRAOCULAR LENS  IMPLANT, BILATERAL Bilateral 2012  . CORONARY ANGIOPLASTY WITH STENT PLACEMENT  07/2009   bare metal stent to SVG to the RCA  . CORONARY ARTERY BYPASS GRAFT  1996   LIMA to LAD,SVG to RCA & SVG to OM  . INSERT / REPLACE / REMOVE PACEMAKER  2010  . MELANOMA EXCISION  05/1974 X2   "taken off my back" (12/15/2012)  . NM MYOVIEW LTD  06/2011   low risk  . PPM GENERATOR CHANGEOUT N/A 01/22/2017   Procedure: PPM GENERATOR CHANGEOUT;  Surgeon: [name], MD;  Location: [facility];  Service: Cardiovascular;  Laterality: N/A;  . TONSILLECTOMY  1938  . TRANSURETHRAL RESECTION OF PROSTATE  1986  . US ECHOCARDIOGRAPHY  07/11/2009   EF 45-50%    Allergies  Allergen Reactions  . Altace [Ramipril] Cough  . Crestor [Rosuvastatin Calcium] Rash  . Penicillins Rash and Other (See Comments)    Has patient had a PCN reaction causing immediate rash, facial/tongue/throat swelling, SOB or lightheadedness  with hypotension: Yes Has patient had a PCN reaction causing severe rash involving mucus membranes or skin necrosis: No Has patient had a PCN reaction that required hospitalization No Has patient had a PCN reaction occurring within the last 10 years: No If all of the above answers are "NO", then may proceed with Cephalosporin use.   . Sulfa Antibiotics Rash  . Sulfamethoxazole-Trimethoprim Rash    Allergies as of 11/02/2019      Reactions   Altace [ramipril] Cough   Crestor [rosuvastatin Calcium] Rash   Penicillins Rash, Other (See Comments)   Has patient had a PCN reaction causing immediate rash, facial/tongue/throat swelling, SOB or lightheadedness with hypotension: Yes Has patient had a PCN reaction causing severe rash involving mucus membranes or skin necrosis: No Has patient had a PCN reaction that required hospitalization No Has patient had a PCN reaction occurring within the last 10 years: No If all of the above answers are "NO", then may proceed with Cephalosporin use.   Sulfa Antibiotics Rash  Sulfamethoxazole-trimethoprim Rash      Medication List       Accurate as of November 02, 2019  9:30 AM. If you have any questions, ask your nurse or doctor.        acetaminophen 500 MG tablet Commonly known as: TYLENOL Take 500 mg by mouth 3 (three) times daily.   amiodarone 100 MG tablet Commonly known as: PACERONE Take 1 tablet (100 mg total) by mouth daily.   clopidogrel 75 MG tablet Commonly known as: PLAVIX Take 75 mg by mouth daily.   clotrimazole-betamethasone cream Commonly known as: LOTRISONE Apply 1 application topically daily as needed.   Droxidopa 100 MG Caps Take 100 mg by mouth in the morning, at noon, and at bedtime. What changed: Another medication with the same name was removed. Continue taking this medication, and follow the directions you see here. Changed by: [name], MD   DULoxetine 20 MG capsule Commonly known as: CYMBALTA Take 20 mg  by mouth daily. For anxiety,insomnia and back pain   feeding supplement (PRO-STAT SUGAR FREE 64) Liqd Take 30 mLs by mouth daily.   fluticasone 50 MCG/ACT nasal spray Commonly known as: FLONASE Place 1 spray into both nostrils 2 (two) times daily.   guaiFENesin 100 MG/5ML Soln Commonly known as: ROBITUSSIN Take 5 mLs (100 mg total) by mouth every 4 (four) hours as needed for cough or to loosen phlegm.   Hydrocortisone-Aloe 1 % Crea Apply topically 2 (two) times daily as needed.   ketoconazole 2 % cream Commonly known as: NIZORAL Apply 1 application topically as needed for irritation. Apply small amount behind ears and in creases beside nose BID x 7 days. MAY REPEAT PRN. (Family supplied).   lactose free nutrition Liqd Take 237 mLs by mouth 2 (two) times daily between meals.   levothyroxine 100 MCG tablet Commonly known as: SYNTHROID Take 100 mcg by mouth daily before breakfast.   meclizine 25 MG tablet Commonly known as: ANTIVERT Take 1 tablet (25 mg total) by mouth 2 (two) times daily as needed for dizziness.   melatonin 3 MG Tabs tablet Take 3 mg by mouth at bedtime.   methocarbamol 500 MG tablet Commonly known as: ROBAXIN Take 250 mg by mouth 2 (two) times daily as needed for muscle spasms.   midodrine 5 MG tablet Commonly known as: PROAMATINE Take 5 mg by mouth 3 (three) times daily with meals. Notify provider if BP is < 90/60.   mineral oil-hydrophilic petrolatum ointment Apply 1 application topically 2 (two) times daily as needed for dry skin. To face   oxybutynin 10 MG 24 hr tablet Commonly known as: DITROPAN-XL Take 1 tablet (10 mg total) by mouth at bedtime.   pantoprazole 40 MG tablet Commonly known as: PROTONIX Take 40 mg by mouth daily.   Plecanatide 3 MG Tabs Commonly known as: Trulance Take 3 mg by mouth daily.   polyethylene glycol 17 g packet Commonly known as: MIRALAX / GLYCOLAX Take 17 g by mouth daily as needed for mild constipation. Take  1-2 capfuls in 8 oz of H2O   pravastatin 40 MG tablet Commonly known as: PRAVACHOL Take 1 tablet (40 mg total) by mouth at bedtime.   rOPINIRole 0.25 MG tablet Commonly known as: REQUIP Take 0.5 mg by mouth 2 (two) times daily.   rOPINIRole 0.25 MG tablet Commonly known as: REQUIP Take 0.25 mg by mouth. 0.25mg , oral, Once A Day - PRN for Restless leg   sennosides-docusate sodium 8.6-50 MG tablet Commonly  known as: SENOKOT-S Take 2 tablets by mouth 2 (two) times daily.   torsemide 10 MG tablet Commonly known as: DEMADEX Take 20 mg by mouth daily. 20 mg once a day   Vitamin D3 125 MCG (5000 UT) Caps Take 5,000 Units by mouth every Monday.       Review of Systems  Constitutional: Positive for appetite change and unexpected weight change.  HENT: Negative.   Respiratory: Positive for shortness of breath.   Cardiovascular: Negative.   Gastrointestinal: Positive for constipation.  Genitourinary: Negative.  Negative for hematuria.  Musculoskeletal: Positive for gait problem.  Skin: Negative.   Neurological: Positive for weakness.  Psychiatric/Behavioral: Negative.     Immunization History  Administered Date(s) Administered  . Influenza, High Dose Seasonal PF 11/01/2014, 11/13/2016, 11/19/2018, 11/19/2018  . Influenza,inj,Quad PF,6+ Mos 11/24/2017  . Influenza,inj,quad, With Preservative 11/17/2017  . Influenza-Unspecified 12/05/2013, 11/05/2014, 11/16/2015  . Moderna SARS-COVID-2 Vaccination 02/08/2019, 03/08/2019  . PPD Test 03/07/2014  . Pneumococcal Conjugate-13 10/05/2009  . Pneumococcal Polysaccharide-23 11/05/2005  . Pneumococcal-Unspecified 10/05/2009  . Tdap 08/02/2014  . Zoster 11/05/2005   Pertinent  Health Maintenance Due  Topic Date Due  . INFLUENZA VACCINE  09/05/2019  . DEXA SCAN  12/14/2024  . PNA vac Low Risk Adult  Completed   Fall Risk  10/09/2017 10/02/2016 10/30/2015 09/05/2015 02/02/2015  Falls in the past year? No No No No No  Risk for fall due  to : - - Impaired balance/gait - -   Functional Status Survey:    Vitals:   11/02/19 0855  BP: 102/60  Pulse: 71  Resp: 19  Temp: 97.7 F (36.5 C)  SpO2: 96%  Weight: 167 lb 3.2 oz (75.8 kg)  Height: 5\' 10"  (1.778 m)   Body mass index is 23.99 kg/m. Physical Exam Vitals reviewed.  Constitutional:      Appearance: Normal appearance.  HENT:     Head: Normocephalic.     Nose: Nose normal.     Mouth/Throat:     Mouth: Mucous membranes are moist.     Pharynx: Oropharynx is clear.  Eyes:     Pupils: Pupils are equal, round, and reactive to light.  Cardiovascular:     Rate and Rhythm: Normal rate.     Heart sounds: Murmur heard.   Pulmonary:     Effort: Pulmonary effort is normal.     Breath sounds: Normal breath sounds.  Abdominal:     General: Abdomen is flat. Bowel sounds are normal.     Palpations: Abdomen is soft.  Musculoskeletal:        General: No swelling.     Cervical back: Neck supple.  Skin:    General: Skin is warm.  Neurological:     Mental Status: He is alert and oriented to person, place, and time.  Psychiatric:        Mood and Affect: Mood normal.        Behavior: Behavior normal.     Labs reviewed: Recent Labs    08/29/19 0233 08/29/19 0233 08/30/19 0253 08/30/19 0253 08/31/19 0205 09/01/19 0226 09/02/19 0514 09/03/19 0545  NA 137   < > 135   < > 137 138 138  --   K 3.7   < > 3.1*   < > 3.6 3.8 3.5  --   CL 102   < > 99   < > 102 99 95*  --   CO2 24   < > 27   < > 25  26 30  --   GLUCOSE 98   < > 104*   < > 114* 101* 112*  --   BUN 16   < > 15   < > 13 14 17   --   CREATININE 0.77   < > 0.72   < > 0.65 0.65 0.88  --   CALCIUM 8.1*   < > 7.7*   < > 8.4* 8.4* 8.5*  --   MG 2.7*  --   --   --  2.3  --   --  2.5*  PHOS 2.1*   < > 3.1  --  3.0  --   --  3.7   < > = values in this interval not displayed.   Recent Labs    03/15/19 0000 08/27/19 0226 08/29/19 0233  AST 14 20 33  ALT 8* 12 18  ALKPHOS 75 53 55  BILITOT  --  0.8 0.7    PROT  --  5.7* 6.1*  ALBUMIN 3.5 2.7* 2.6*   Recent Labs    08/26/19 0111 08/26/19 0127 08/31/19 0205 09/01/19 0226 09/03/19 0545  WBC 17.2*   < > 8.9 8.0 7.8  NEUTROABS 13.2*  --   --   --   --   HGB 12.4*   < > 10.3* 10.2* 11.1*  HCT 37.2*   < > 32.1* 31.7* 34.1*  MCV 99.2   < > 98.5 97.5 96.1  PLT 303   < > 218 250 322   < > = values in this interval not displayed.   Lab Results  Component Value Date   TSH 1.71 03/15/2019   Lab Results  Component Value Date   HGBA1C 5.8 (H) 08/26/2019   Lab Results  Component Value Date   CHOL 84 10/18/2019   HDL 32 (A) 10/18/2019   LDLCALC 31 10/18/2019   TRIG 133 10/18/2019   CHOLHDL 2.6 05/05/2017    Significant Diagnostic Results in last 30 days:  No results found.  Assessment/Plan Poor appetite D/w the patient Weight is stable right now Daughter wants him to consider remeron He wants to  wait to start Remeron  He will try to continue eating and supplements Reval in few weeks.  Combined systolic and diastolic congestive heart failure,  (HCC) On Low dose of Demadex Bun and Creat stable Has severe aortic stenosis.  EF 40 to 45% Palliative care  Coronary artery disease  Plavix and Statin  Ventricular tachycardia (HCC) Continue on amiodarone  Neurogenic orthostatic hypotension (HCC) BP stable on Midodrine and [name] Chronic constipation On Trulance Hyperlipidemia Change Prevachol to 20 mg QD Hematuria Was taken off aspirin ON Plavix and doing well  Hypothyroidism due to medication TSH normal in 2/21 RLS (restless legs syndrome) On Requip Family/ staff Communication:   Labs/tests ordered:  CBC,CMP and TSH in 4 weeks

## 2019-11-09 ENCOUNTER — Ambulatory Visit (INDEPENDENT_AMBULATORY_CARE_PROVIDER_SITE_OTHER): Payer: Medicare PPO

## 2019-11-09 DIAGNOSIS — I472 Ventricular tachycardia, unspecified: Secondary | ICD-10-CM

## 2019-11-10 ENCOUNTER — Non-Acute Institutional Stay (SKILLED_NURSING_FACILITY): Payer: Medicare PPO | Admitting: Internal Medicine

## 2019-11-10 ENCOUNTER — Encounter: Payer: Self-pay | Admitting: Internal Medicine

## 2019-11-10 DIAGNOSIS — K644 Residual hemorrhoidal skin tags: Secondary | ICD-10-CM

## 2019-11-10 DIAGNOSIS — I5040 Unspecified combined systolic (congestive) and diastolic (congestive) heart failure: Secondary | ICD-10-CM

## 2019-11-10 DIAGNOSIS — L309 Dermatitis, unspecified: Secondary | ICD-10-CM | POA: Diagnosis not present

## 2019-11-10 DIAGNOSIS — R21 Rash and other nonspecific skin eruption: Secondary | ICD-10-CM

## 2019-11-10 LAB — CUP PACEART REMOTE DEVICE CHECK
Battery Remaining Longevity: 98 mo
Battery Remaining Percentage: 89 %
Battery Voltage: 2.95 V
Brady Statistic AP VP Percent: 36 %
Brady Statistic AP VS Percent: 63 %
Brady Statistic AS VP Percent: 1 %
Brady Statistic AS VS Percent: 1 %
Brady Statistic RA Percent Paced: 98 %
Brady Statistic RV Percent Paced: 36 %
Date Time Interrogation Session: 20211005141519
Implantable Lead Implant Date: 20100309
Implantable Lead Implant Date: 20100309
Implantable Lead Location: 753859
Implantable Lead Location: 753860
Implantable Pulse Generator Implant Date: 20181219
Lead Channel Impedance Value: 400 Ohm
Lead Channel Impedance Value: 510 Ohm
Lead Channel Pacing Threshold Amplitude: 0.75 V
Lead Channel Pacing Threshold Amplitude: 0.75 V
Lead Channel Pacing Threshold Pulse Width: 0.4 ms
Lead Channel Pacing Threshold Pulse Width: 0.4 ms
Lead Channel Sensing Intrinsic Amplitude: 1.5 mV
Lead Channel Sensing Intrinsic Amplitude: 10.8 mV
Lead Channel Setting Pacing Amplitude: 1 V
Lead Channel Setting Pacing Amplitude: 2.5 V
Lead Channel Setting Pacing Pulse Width: 0.4 ms
Lead Channel Setting Sensing Sensitivity: 2 mV
Pulse Gen Model: 2272
Pulse Gen Serial Number: 8965776

## 2019-11-10 NOTE — Progress Notes (Signed)
Location:    [place] Room Number: 29 Place of Service:  SNF [phone]) Provider:  Veleta Miners MD  [name], MD  Patient Care Team: [name], MD as PCP - General (Internal Medicine) [name], MD as PCP - Cardiology (Cardiology) [name], MD as Attending Physician (Cardiology) Vevelyn Royals, MD as Consulting Physician (Ophthalmology) Franchot Gallo, MD as Consulting Physician (Urology) Tanda Rockers, MD as Consulting Physician (Pulmonary Disease) Allyn Kenner, MD (Dermatology) Deliah Goody, PA-C as Physician Assistant (Physician Assistant) Mast, Man X, NP as Nurse Practitioner (Internal Medicine) Ngetich, Nelda Bucks, NP as Nurse Practitioner (Family Medicine)  Extended Emergency Contact Information Primary Emergency Contact: [name] of [place] Phone: [phone] Relation: Daughter Secondary Emergency Contact: Avallone,[name] Address: [address]          [place] Phone: [phone] Mobile Phone: [phone] Relation: Spouse  Code Status:  DNR Goals of care: Advanced Directive information Advanced Directives 10/08/2019  Does Patient Have a Medical Advance Directive? Yes  Type of Paramedic of [place];Out of facility DNR (pink MOST or yellow form)  Does patient want to make changes to medical advance directive? No - Patient declined  Copy of [place] in Chart? -  Would patient like information on creating a medical advance directive? -  Pre-existing out of facility DNR order (yellow form or pink MOST form) Pink MOST form placed in chart (order not valid for inpatient use);Yellow form placed in chart (order not valid for inpatient use)     Chief Complaint  Patient presents with  . Acute Visit    Facial rash    HPI:  Pt is a 86 y.o. male seen today for an acute visit for Facial Rash and Rectal Pain Lives in  SNF  He has h/o CAD s/p Bypass surgery with Extensive Inferior Wall Scarring, Ischemic Cardiomyopathy withsystolic heart failureEF of 35-40%Moderate AS,history of ventricular tachycardia stable on amiodarone, sinus node dysfunction with dual-chamber PPP, severe orthostatic hypotension neurogenic,history of compression fractures, history of ischemic stroke in the past on dual therapy.,History of chronic indwelling Foley catheter He wasadmitted in the hospital from 7 21-7 30 with hematuria,sepsis, tachycardia and hypotension. Was found to have Tachycardia with VT, and Severe AS Cardiology Recommended Palliative care  Noticed Facial rash for past few days. Small Papules in and around his Forehead. Also has some around his Nose. He says they itch and burn  Also c/o Pain when he moves his Bowels. Not constipated per Nurses Past Medical History:  Diagnosis Date  . Arthritis    "minor, back and sometimes knees" (12/15/2012)  . Bradycardia    SSS s/p St Jude PPM 04/12/2008  . CAD (coronary artery disease) 12/30/2014   CABG (LIMA-LAD, SVG-RCA, SVG-OM in 1996).  07/2009 BMS to SVG-RCA. Cath in 04/2010 with patent stents   . Cardiomyopathy, ischemic 08/25/2012  . Chronic knee pain 12/03/2014  . Combined congestive systolic and diastolic heart failure ([place]) 02/02/2015   Hx EF 41%. BNP 96.8 02/21/15 Torsemide 04/06/15 Na 142, K 4.6, Bun 16, creat 0.89 04/20/14 BNP 111.7, Na 142, K 4.6, Bun 16, creat 0.9   . Depression with anxiety 02/02/2015   02/21/15 Hgb A1c 5.8 03/10/15 MMSE 30/30   . Dizziness, after diuretic asscoiated with hypotension and responded to fluid bolus 06/05/2011   04/28/15 US carotid R+L normal bilateral arterial velocities.    Marland Kitchen Dyspnea 08/11/2014  Followed in Pulmonary clinic/ [facility]/ [place]  - 08/11/2014  Walked RA x 1 laps @ 185 ft each stopped due to fatigue/off balance/ slow pace/  no sob or desat  - PFT's  09/26/2014  FEV1 2.26 (85 % ) ratio 76  p no % improvement from saba  with DLCO  67 % corrects to 93 % for alv volume      Since prev study 08/04/13 minimal change lung vol or dlco    . Embolic cerebral infarction ([place]) 12/06/2015  . GERD (gastroesophageal reflux disease)   . Gout 02/09/2015  . Hiatal hernia   . Hyperlipidemia   . Hypertension   . Hypothyroid   . Influenza A 03/10/2016  . Insomnia   . Melanoma of back ([place]) 1976  . Myocardial infarction [facility]) 1996; 2011   "both silent" (12/15/2012)  . Nonrheumatic aortic valve stenosis   . Orthostatic hypotension   . Osteoporosis, senile   . Pacemaker   . RBBB   . Restless leg 02/02/2015  . Right leg weakness 12/06/2015  . [facility] spotted fever   . S/P CABG x 4   . Sick sinus syndrome ([place]) 01/31/2014  . Sustained ventricular tachycardia ([place]) 07/27/2014  . Urinary retention 12/30/2014   has a cathrater   Past Surgical History:  Procedure Laterality Date  . CARDIAC CATHETERIZATION  04/2010   LIMA to LAD patent,SVG to OM patent,no in-stnet restenosis RCA  . CATARACT EXTRACTION W/ INTRAOCULAR LENS  IMPLANT, BILATERAL Bilateral 2012  . CORONARY ANGIOPLASTY WITH STENT PLACEMENT  07/2009   bare metal stent to SVG to the RCA  . CORONARY ARTERY BYPASS GRAFT  1996   LIMA to LAD,SVG to RCA & SVG to OM  . INSERT / REPLACE / REMOVE PACEMAKER  2010  . MELANOMA EXCISION  05/1974 X2   "taken off my back" (12/15/2012)  . NM MYOVIEW LTD  06/2011   low risk  . PPM GENERATOR CHANGEOUT N/A 01/22/2017   Procedure: PPM GENERATOR CHANGEOUT;  Surgeon: [name], MD;  Location: [facility];  Service: Cardiovascular;  Laterality: N/A;  . TONSILLECTOMY  1938  . TRANSURETHRAL RESECTION OF PROSTATE  1986  . US ECHOCARDIOGRAPHY  07/11/2009   EF 45-50%    Allergies  Allergen Reactions  . Altace [Ramipril] Cough  . Crestor [Rosuvastatin Calcium] Rash  . Penicillins Rash and Other (See Comments)    Has patient had a PCN reaction causing immediate rash, facial/tongue/throat swelling, SOB or lightheadedness  with hypotension: Yes Has patient had a PCN reaction causing severe rash involving mucus membranes or skin necrosis: No Has patient had a PCN reaction that required hospitalization No Has patient had a PCN reaction occurring within the last 10 years: No If all of the above answers are "NO", then may proceed with Cephalosporin use.   . Sulfa Antibiotics Rash  . Sulfamethoxazole-Trimethoprim Rash    Allergies as of 11/10/2019      Reactions   Altace [ramipril] Cough   Crestor [rosuvastatin Calcium] Rash   Penicillins Rash, Other (See Comments)   Has patient had a PCN reaction causing immediate rash, facial/tongue/throat swelling, SOB or lightheadedness with hypotension: Yes Has patient had a PCN reaction causing severe rash involving mucus membranes or skin necrosis: No Has patient had a PCN reaction that required hospitalization No Has patient had a PCN reaction occurring within the last 10 years: No If all of the above answers are "NO", then may proceed with Cephalosporin use.   Sulfa  Antibiotics Rash   Sulfamethoxazole-trimethoprim Rash      Medication List       Accurate as of November 10, 2019  1:24 PM. If you have any questions, ask your nurse or doctor.        acetaminophen 500 MG tablet Commonly known as: TYLENOL Take 500 mg by mouth 3 (three) times daily.   amiodarone 100 MG tablet Commonly known as: PACERONE Take 1 tablet (100 mg total) by mouth daily.   clopidogrel 75 MG tablet Commonly known as: PLAVIX Take 75 mg by mouth daily.   clotrimazole-betamethasone cream Commonly known as: LOTRISONE Apply 1 application topically daily as needed.   Droxidopa 100 MG Caps Take 100 mg by mouth in the morning, at noon, and at bedtime.   DULoxetine 20 MG capsule Commonly known as: CYMBALTA Take 20 mg by mouth daily. For anxiety,insomnia and back pain   feeding supplement (PRO-STAT SUGAR FREE 64) Liqd Take 30 mLs by mouth daily.   fluticasone 50 MCG/ACT nasal  spray Commonly known as: FLONASE Place 1 spray into both nostrils 2 (two) times daily.   guaiFENesin 100 MG/5ML Soln Commonly known as: ROBITUSSIN Take 5 mLs (100 mg total) by mouth every 4 (four) hours as needed for cough or to loosen phlegm.   Hydrocortisone-Aloe 1 % Crea Apply topically 2 (two) times daily as needed.   ketoconazole 2 % cream Commonly known as: NIZORAL Apply 1 application topically as needed for irritation. Apply small amount behind ears and in creases beside nose BID x 7 days. MAY REPEAT PRN. (Family supplied).   lactose free nutrition Liqd Take 237 mLs by mouth 2 (two) times daily between meals.   levothyroxine 100 MCG tablet Commonly known as: SYNTHROID Take 100 mcg by mouth daily before breakfast.   meclizine 25 MG tablet Commonly known as: ANTIVERT Take 1 tablet (25 mg total) by mouth 2 (two) times daily as needed for dizziness.   melatonin 3 MG Tabs tablet Take 3 mg by mouth at bedtime.   methocarbamol 500 MG tablet Commonly known as: ROBAXIN Take 250 mg by mouth 2 (two) times daily as needed for muscle spasms.   midodrine 5 MG tablet Commonly known as: PROAMATINE Take 5 mg by mouth 3 (three) times daily with meals. Notify provider if BP is < 90/60.   mineral oil-hydrophilic petrolatum ointment Apply 1 application topically 2 (two) times daily as needed for dry skin. To face   oxybutynin 10 MG 24 hr tablet Commonly known as: DITROPAN-XL Take 1 tablet (10 mg total) by mouth at bedtime.   pantoprazole 40 MG tablet Commonly known as: PROTONIX Take 40 mg by mouth daily.   Plecanatide 3 MG Tabs Commonly known as: Trulance Take 3 mg by mouth daily.   polyethylene glycol 17 g packet Commonly known as: MIRALAX / GLYCOLAX Take 17 g by mouth daily as needed for mild constipation. Take 1-2 capfuls in 8 oz of H2O   pravastatin 20 MG tablet Commonly known as: PRAVACHOL Take 20 mg by mouth daily. What changed: Another medication with the same name  was removed. Continue taking this medication, and follow the directions you see here. Changed by: [name], MD   rOPINIRole 0.25 MG tablet Commonly known as: REQUIP Take 0.5 mg by mouth 2 (two) times daily.   rOPINIRole 0.25 MG tablet Commonly known as: REQUIP Take 0.25 mg by mouth. 0.25mg , oral, Once A Day - PRN for Restless leg   sennosides-docusate sodium 8.6-50 MG tablet Commonly  known as: SENOKOT-S Take 2 tablets by mouth 2 (two) times daily.   torsemide 10 MG tablet Commonly known as: DEMADEX Take 20 mg by mouth daily. 20 mg once a day   Vitamin D3 125 MCG (5000 UT) Caps Take 5,000 Units by mouth every Monday.       Review of Systems  Constitutional: Negative for activity change and appetite change.  HENT: Negative.   Respiratory: Negative.   Cardiovascular: Positive for leg swelling.  Gastrointestinal: Negative for blood in stool and constipation.  Genitourinary: Negative.   Musculoskeletal: Positive for gait problem.  Skin: Positive for rash.  Neurological: Positive for weakness.  Psychiatric/Behavioral: Negative.     Immunization History  Administered Date(s) Administered  . Influenza, High Dose Seasonal PF 11/01/2014, 11/13/2016, 11/19/2018, 11/19/2018  . Influenza,inj,Quad PF,6+ Mos 11/24/2017  . Influenza,inj,quad, With Preservative 11/17/2017  . Influenza-Unspecified 12/05/2013, 11/05/2014, 11/16/2015  . Moderna SARS-COVID-2 Vaccination 02/08/2019, 03/08/2019  . PPD Test 03/07/2014  . Pneumococcal Conjugate-13 10/05/2009  . Pneumococcal Polysaccharide-23 11/05/2005  . Pneumococcal-Unspecified 10/05/2009  . Tdap 08/02/2014  . Zoster 11/05/2005   Pertinent  Health Maintenance Due  Topic Date Due  . INFLUENZA VACCINE  09/05/2019  . DEXA SCAN  12/14/2024  . PNA vac Low Risk Adult  Completed   Fall Risk  10/09/2017 10/02/2016 10/30/2015 09/05/2015 02/02/2015  Falls in the past year? No No No No No  Risk for fall due to : - - Impaired balance/gait -  -   Functional Status Survey:    Vitals:   11/10/19 1303  BP: (!) 158/84  Pulse: 72  Resp: 18  Temp: 97.7 F (36.5 C)  SpO2: 96%  Weight: 168 lb 1.6 oz (76.2 kg)  Height: 5\' 10"  (1.778 m)   Body mass index is 24.12 kg/m. Physical Exam Vitals reviewed.  Constitutional:      Appearance: Normal appearance.  HENT:     Head: Normocephalic.     Comments: Has Macular Papular Rash in his Forehead and around the nose    Nose: Nose normal.     Mouth/Throat:     Mouth: Mucous membranes are moist.     Pharynx: Oropharynx is clear.  Cardiovascular:     Rate and Rhythm: Normal rate.     Pulses: Normal pulses.     Heart sounds: Murmur heard.   Pulmonary:     Effort: Pulmonary effort is normal.     Breath sounds: Normal breath sounds.  Abdominal:     General: Abdomen is flat. Bowel sounds are normal.     Palpations: Abdomen is soft.     Comments: Rectal Exam done with the Nurse has External hemorrhoids mild inflamed and tender  NO any rectal bleeding  Musculoskeletal:        General: No swelling.     Cervical back: Neck supple.  Skin:    General: Skin is warm and dry.  Neurological:     General: No focal deficit present.     Mental Status: He is alert.  Psychiatric:        Mood and Affect: Mood normal.        Thought Content: Thought content normal.     Labs reviewed: Recent Labs    08/29/19 0233 08/29/19 0233 08/30/19 0253 08/30/19 0253 08/31/19 0205 09/01/19 0226 09/02/19 0514 09/03/19 0545  NA 137   < > 135   < > 137 138 138  --   K 3.7   < > 3.1*   < > 3.6  3.8 3.5  --   CL 102   < > 99   < > 102 99 95*  --   CO2 24   < > 27   < > 25 26 30   --   GLUCOSE 98   < > 104*   < > 114* 101* 112*  --   BUN 16   < > 15   < > 13 14 17   --   CREATININE 0.77   < > 0.72   < > 0.65 0.65 0.88  --   CALCIUM 8.1*   < > 7.7*   < > 8.4* 8.4* 8.5*  --   MG 2.7*  --   --   --  2.3  --   --  2.5*  PHOS 2.1*   < > 3.1  --  3.0  --   --  3.7   < > = values in this interval not  displayed.   Recent Labs    03/15/19 0000 08/27/19 0226 08/29/19 0233  AST 14 20 33  ALT 8* 12 18  ALKPHOS 75 53 55  BILITOT  --  0.8 0.7  PROT  --  5.7* 6.1*  ALBUMIN 3.5 2.7* 2.6*   Recent Labs    08/26/19 0111 08/26/19 0127 08/31/19 0205 09/01/19 0226 09/03/19 0545  WBC 17.2*   < > 8.9 8.0 7.8  NEUTROABS 13.2*  --   --   --   --   HGB 12.4*   < > 10.3* 10.2* 11.1*  HCT 37.2*   < > 32.1* 31.7* 34.1*  MCV 99.2   < > 98.5 97.5 96.1  PLT 303   < > 218 250 322   < > = values in this interval not displayed.   Lab Results  Component Value Date   TSH 1.71 03/15/2019   Lab Results  Component Value Date   HGBA1C 5.8 (H) 08/26/2019   Lab Results  Component Value Date   CHOL 84 10/18/2019   HDL 32 (A) 10/18/2019   LDLCALC 31 10/18/2019   TRIG 133 10/18/2019   CHOLHDL 2.6 05/05/2017    Significant Diagnostic Results in last 30 days:  No results found.  Assessment/Plan Facial rash ? Dermatitis Will restart Cutivate BID for 1 week Dermatology to see him tomorrow Also Told the wife to use Sunscreen when goes out   External hemorrhoids Anusol HC BID for 2 weeks   Other Issues  Poor appetite D/w the patient Weight is stable right now Daughter wants him to consider remeron He wants to  wait to start Remeron  He will try to continue eating and supplements Reval in few weeks.  Combined systolic and diastolic congestive heart failure,  (HCC) On Low dose of Demadex Bun and Creat stable Has severe aortic stenosis. EF 40 to 45% Palliative care  Coronary artery disease  Plavix and Statin  Ventricular tachycardia (HCC) Continue on amiodarone  Neurogenic orthostatic hypotension (HCC) BP stable on Midodrine and [name] Chronic constipation On Trulance Hyperlipidemia Change Prevachol to 20 mg QD Hematuria Was taken off aspirin ON Plavix and doing well  Hypothyroidism due to medication TSH normalin 2/21 RLS (restless legs syndrome) On  Requip     Family/ staff Communication:   Labs/tests ordered:

## 2019-11-11 NOTE — Progress Notes (Signed)
Remote pacemaker transmission.   

## 2019-11-17 ENCOUNTER — Other Ambulatory Visit: Payer: Self-pay | Admitting: Cardiovascular Disease

## 2019-11-22 ENCOUNTER — Encounter: Payer: Self-pay | Admitting: Nurse Practitioner

## 2019-11-22 ENCOUNTER — Non-Acute Institutional Stay (SKILLED_NURSING_FACILITY): Payer: Medicare PPO | Admitting: Nurse Practitioner

## 2019-11-22 DIAGNOSIS — K5909 Other constipation: Secondary | ICD-10-CM

## 2019-11-22 DIAGNOSIS — I472 Ventricular tachycardia, unspecified: Secondary | ICD-10-CM

## 2019-11-22 DIAGNOSIS — N319 Neuromuscular dysfunction of bladder, unspecified: Secondary | ICD-10-CM | POA: Diagnosis not present

## 2019-11-22 DIAGNOSIS — G2581 Restless legs syndrome: Secondary | ICD-10-CM

## 2019-11-22 DIAGNOSIS — I255 Ischemic cardiomyopathy: Secondary | ICD-10-CM

## 2019-11-22 DIAGNOSIS — G903 Multi-system degeneration of the autonomic nervous system: Secondary | ICD-10-CM

## 2019-11-22 DIAGNOSIS — D500 Iron deficiency anemia secondary to blood loss (chronic): Secondary | ICD-10-CM | POA: Diagnosis not present

## 2019-11-22 DIAGNOSIS — E032 Hypothyroidism due to medicaments and other exogenous substances: Secondary | ICD-10-CM

## 2019-11-22 DIAGNOSIS — K219 Gastro-esophageal reflux disease without esophagitis: Secondary | ICD-10-CM

## 2019-11-22 DIAGNOSIS — F339 Major depressive disorder, recurrent, unspecified: Secondary | ICD-10-CM

## 2019-11-22 DIAGNOSIS — M159 Polyosteoarthritis, unspecified: Secondary | ICD-10-CM

## 2019-11-22 DIAGNOSIS — I5040 Unspecified combined systolic (congestive) and diastolic (congestive) heart failure: Secondary | ICD-10-CM

## 2019-11-22 NOTE — Assessment & Plan Note (Signed)
Orthostatic hypotension, neurogenic, tales Droxidopa, Midodrine.

## 2019-11-22 NOTE — Progress Notes (Signed)
Location:   [facility] Room Number: 29 Place of Service:  SNF [phone]) Provider:  Marlana Latus, NP  [name], MD  Patient Care Team: [name], MD as PCP - General (Internal Medicine) [name], MD as PCP - Cardiology (Cardiology) [name], MD as Attending Physician (Cardiology) Vevelyn Royals, MD as Consulting Physician (Ophthalmology) Franchot Gallo, MD as Consulting Physician (Urology) Tanda Rockers, MD as Consulting Physician (Pulmonary Disease) Allyn Kenner, MD (Dermatology) Deliah Goody, PA-C as Physician Assistant (Physician Assistant) Nerissa Constantin X, NP as Nurse Practitioner (Internal Medicine) Ngetich, Nelda Bucks, NP as Nurse Practitioner (Family Medicine)  Extended Emergency Contact Information Primary Emergency Contact: [name] of [place] Phone: [phone] Relation: Daughter Secondary Emergency Contact: Brenn,[name] Address: [address]          [place] Phone: [phone] Mobile Phone: [phone] Relation: Spouse  Code Status:  DNR Goals of care: Advanced Directive information Advanced Directives 11/22/2019  Does Patient Have a Medical Advance Directive? Yes  Type of Paramedic of [place];Out of facility DNR (pink MOST or yellow form)  Does patient want to make changes to medical advance directive? No - Patient declined  Copy of [place] in Chart? Yes - validated most recent copy scanned in chart (See row information)  Would patient like information on creating a medical advance directive? -  Pre-existing out of facility DNR order (yellow form or pink MOST form) Pink MOST form placed in chart (order not valid for inpatient use)     Chief Complaint  Patient presents with  . Medical Management of Chronic Issues    Routine follow up vsit.    HPI:  Pt is a 86 y.o. male seen today for medical  management of chronic diseases.    Hx of Afib/SSS s/p St Jude PPM 2010, ventricular tachycardia, takes Amiodarone Depression, takes Duloxetine 20mg  qd CAD, CABG x4, stents, off ASA due to hematuria. takes Plavix, Statin.  CHF, takes Torsemide, EF 35-40%,  OA knee pain, left lower back, takes tylenol, Robaxin.  GERD takes Pantoprazole Hypothyroidism, takes Levothyroxine. TSH 3.44 09/09/19 Orthostatic hypotension, neurogenic, tales Droxidopa, Midodrine.  RLS, takes Ropinirole Constipation, takes Trulance, Senokot S, MiraLax prn             Urinary retention, chronic indwelling cath, takes Oxybutynin.              Blood loss anemia, s/p 3 units PRBC transfusion, Hgb 11.1 09/03/19    Past Medical History:  Diagnosis Date  . Arthritis    "minor, back and sometimes knees" (12/15/2012)  . Bradycardia    SSS s/p St Jude PPM 04/12/2008  . CAD (coronary artery disease) 12/30/2014   CABG (LIMA-LAD, SVG-RCA, SVG-OM in 1996).  07/2009 BMS to SVG-RCA. Cath in 04/2010 with patent stents   . Cardiomyopathy, ischemic 08/25/2012  . Chronic knee pain 12/03/2014  . Combined congestive systolic and diastolic heart failure ([place]) 02/02/2015   Hx EF 41%. BNP 96.8 02/21/15 Torsemide 04/06/15 Na 142, K 4.6, Bun 16, creat 0.89 04/20/14 BNP 111.7, Na 142, K 4.6, Bun 16, creat 0.9   . Depression with anxiety 02/02/2015   02/21/15 Hgb A1c 5.8 03/10/15 MMSE 30/30   . Dizziness, after diuretic asscoiated with hypotension and responded to fluid bolus 06/05/2011   04/28/15 US carotid R+L normal bilateral arterial velocities.    Marland Kitchen Dyspnea 08/11/2014   Followed in Pulmonary clinic/  [facility]/ [place]  - 08/11/2014  Walked RA x 1 laps @ 185 ft each stopped due to fatigue/off balance/ slow pace/  no sob or desat  - PFT's  09/26/2014  FEV1 2.26 (85 % ) ratio 76  p no % improvement from saba with DLCO  67 % corrects to 93 % for alv  volume      Since prev study 08/04/13 minimal change lung vol or dlco    . Embolic cerebral infarction ([place]) 12/06/2015  . GERD (gastroesophageal reflux disease)   . Gout 02/09/2015  . Hiatal hernia   . Hyperlipidemia   . Hypertension   . Hypothyroid   . Influenza A 03/10/2016  . Insomnia   . Melanoma of back ([place]) 1976  . Myocardial infarction [facility]) 1996; 2011   "both silent" (12/15/2012)  . Nonrheumatic aortic valve stenosis   . Orthostatic hypotension   . Osteoporosis, senile   . Pacemaker   . RBBB   . Restless leg 02/02/2015  . Right leg weakness 12/06/2015  . [facility] spotted fever   . S/P CABG x 4   . Sick sinus syndrome ([place]) 01/31/2014  . Sustained ventricular tachycardia ([place]) 07/27/2014  . Urinary retention 12/30/2014   has a cathrater   Past Surgical History:  Procedure Laterality Date  . CARDIAC CATHETERIZATION  04/2010   LIMA to LAD patent,SVG to OM patent,no in-stnet restenosis RCA  . CATARACT EXTRACTION W/ INTRAOCULAR LENS  IMPLANT, BILATERAL Bilateral 2012  . CORONARY ANGIOPLASTY WITH STENT PLACEMENT  07/2009   bare metal stent to SVG to the RCA  . CORONARY ARTERY BYPASS GRAFT  1996   LIMA to LAD,SVG to RCA & SVG to OM  . INSERT / REPLACE / REMOVE PACEMAKER  2010  . MELANOMA EXCISION  05/1974 X2   "taken off my back" (12/15/2012)  . NM MYOVIEW LTD  06/2011   low risk  . PPM GENERATOR CHANGEOUT N/A 01/22/2017   Procedure: PPM GENERATOR CHANGEOUT;  Surgeon: [name], MD;  Location: [facility];  Service: Cardiovascular;  Laterality: N/A;  . TONSILLECTOMY  1938  . TRANSURETHRAL RESECTION OF PROSTATE  1986  . US ECHOCARDIOGRAPHY  07/11/2009   EF 45-50%    Allergies  Allergen Reactions  . Altace [Ramipril] Cough  . Crestor [Rosuvastatin Calcium] Rash  . Penicillins Rash and Other (See Comments)    Has patient had a PCN reaction causing immediate rash, facial/tongue/throat swelling, SOB or lightheadedness with hypotension: Yes Has patient had a  PCN reaction causing severe rash involving mucus membranes or skin necrosis: No Has patient had a PCN reaction that required hospitalization No Has patient had a PCN reaction occurring within the last 10 years: No If all of the above answers are "NO", then may proceed with Cephalosporin use.   . Sulfa Antibiotics Rash  . Sulfamethoxazole-Trimethoprim Rash    Allergies as of 11/22/2019      Reactions   Altace [ramipril] Cough   Crestor [rosuvastatin Calcium] Rash   Penicillins Rash, Other (See Comments)   Has patient had a PCN reaction causing immediate rash, facial/tongue/throat swelling, SOB or lightheadedness with hypotension: Yes Has patient had a PCN reaction causing severe rash involving mucus membranes or skin necrosis: No Has patient had a PCN reaction that required hospitalization No Has patient had a PCN reaction occurring within the last 10 years: No If all of the above answers are "NO", then may proceed with Cephalosporin use.   Sulfa Antibiotics Rash  Sulfamethoxazole-trimethoprim Rash      Medication List       Accurate as of November 22, 2019 11:59 PM. If you have any questions, ask your nurse or doctor.        acetaminophen 500 MG tablet Commonly known as: TYLENOL Take 500 mg by mouth 3 (three) times daily.   amiodarone 100 MG tablet Commonly known as: PACERONE Take 1 tablet (100 mg total) by mouth daily.   Anusol-HC 2.5 % rectal cream Generic drug: hydrocortisone Place 1 application rectally 2 (two) times daily.   clopidogrel 75 MG tablet Commonly known as: PLAVIX Take 75 mg by mouth daily.   clotrimazole-betamethasone cream Commonly known as: LOTRISONE Apply 1 application topically daily as needed.   Droxidopa 100 MG Caps Take 100 mg by mouth in the morning, at noon, and at bedtime.   DULoxetine 20 MG capsule Commonly known as: CYMBALTA Take 20 mg by mouth daily. For anxiety,insomnia and back pain   feeding supplement (PRO-STAT SUGAR FREE 64)  Liqd Take 30 mLs by mouth daily.   fluticasone 50 MCG/ACT nasal spray Commonly known as: FLONASE Place 1 spray into both nostrils 2 (two) times daily.   guaiFENesin 100 MG/5ML Soln Commonly known as: ROBITUSSIN Take 5 mLs (100 mg total) by mouth every 4 (four) hours as needed for cough or to loosen phlegm.   Hydrocortisone-Aloe 1 % Crea Apply topically 2 (two) times daily as needed.   ketoconazole 2 % cream Commonly known as: NIZORAL Apply 1 application topically as needed for irritation. Apply small amount behind ears and in creases beside nose BID x 7 days. MAY REPEAT PRN. (Family supplied).   lactose free nutrition Liqd Take 237 mLs by mouth 2 (two) times daily between meals.   levothyroxine 100 MCG tablet Commonly known as: SYNTHROID Take 100 mcg by mouth daily before breakfast.   meclizine 25 MG tablet Commonly known as: ANTIVERT Take 1 tablet (25 mg total) by mouth 2 (two) times daily as needed for dizziness.   melatonin 3 MG Tabs tablet Take 3 mg by mouth at bedtime.   methocarbamol 500 MG tablet Commonly known as: ROBAXIN Take 250 mg by mouth 2 (two) times daily as needed for muscle spasms.   midodrine 5 MG tablet Commonly known as: PROAMATINE Take 5 mg by mouth 3 (three) times daily with meals. Notify provider if BP is < 90/60.   mineral oil-hydrophilic petrolatum ointment Apply 1 application topically 2 (two) times daily as needed for dry skin. To face   oxybutynin 10 MG 24 hr tablet Commonly known as: DITROPAN-XL Take 1 tablet (10 mg total) by mouth at bedtime.   pantoprazole 40 MG tablet Commonly known as: PROTONIX Take 40 mg by mouth daily.   Plecanatide 3 MG Tabs Commonly known as: Trulance Take 3 mg by mouth daily.   polyethylene glycol 17 g packet Commonly known as: MIRALAX / GLYCOLAX Take 17 g by mouth daily as needed for mild constipation. Take 1-2 capfuls in 8 oz of H2O   pravastatin 20 MG tablet Commonly known as: PRAVACHOL Take 20 mg  by mouth daily.   rOPINIRole 0.25 MG tablet Commonly known as: REQUIP Take 0.5 mg by mouth 2 (two) times daily. What changed: Another medication with the same name was removed. Continue taking this medication, and follow the directions you see here. Changed by: Burdett Pinzon X Froylan Hobby, NP   sennosides-docusate sodium 8.6-50 MG tablet Commonly known as: SENOKOT-S Take 2 tablets by mouth 2 (two) times daily.  torsemide 10 MG tablet Commonly known as: DEMADEX Take 20 mg by mouth daily. 20 mg once a day   Vitamin D3 125 MCG (5000 UT) Caps Take 5,000 Units by mouth every Monday.       Review of Systems  Constitutional: Negative for fatigue, fever and unexpected weight change.       Weight loss #4Ibs in the past month  HENT: Positive for hearing loss. Negative for congestion and voice change.   Eyes: Negative for visual disturbance.  Respiratory: Positive for cough and shortness of breath.        Chronic DOE, cough  Cardiovascular: Positive for leg swelling. Negative for chest pain and palpitations.  Gastrointestinal: Negative for abdominal pain and constipation.  Genitourinary: Positive for difficulty urinating. Negative for dysuria and hematuria.       Foley  Musculoskeletal: Positive for arthralgias, back pain and gait problem.       Right shoulder limited over head ROM with pain.  Positional lower back pain is chronic, chronic knee pain  Skin: Negative for color change.  Neurological: Negative for dizziness, speech difficulty, weakness and light-headedness.       Memory lapses. Mild expressive aphasia.   Psychiatric/Behavioral: Negative for behavioral problems and sleep disturbance. The patient is not nervous/anxious.     Immunization History  Administered Date(s) Administered  . Influenza, High Dose Seasonal PF 11/01/2014, 11/13/2016, 11/19/2018, 11/19/2018  . Influenza,inj,Quad PF,6+ Mos 11/24/2017  . Influenza,inj,quad, With Preservative 11/17/2017  . Influenza-Unspecified  12/05/2013, 11/05/2014, 11/16/2015, 11/09/2019  . Moderna SARS-COVID-2 Vaccination 02/08/2019, 03/08/2019  . PPD Test 03/07/2014  . Pneumococcal Conjugate-13 10/05/2009  . Pneumococcal Polysaccharide-23 11/05/2005  . Pneumococcal-Unspecified 10/05/2009  . Tdap 08/02/2014  . Zoster 11/05/2005   Pertinent  Health Maintenance Due  Topic Date Due  . DEXA SCAN  12/14/2024  . INFLUENZA VACCINE  Completed  . PNA vac Low Risk Adult  Completed   Fall Risk  10/09/2017 10/02/2016 10/30/2015 09/05/2015 02/02/2015  Falls in the past year? No No No No No  Risk for fall due to : - - Impaired balance/gait - -   Functional Status Survey:    Vitals:   11/22/19 1533  BP: 104/66  Pulse: 73  Resp: 19  Temp: (!) 97.3 F (36.3 C)  SpO2: 96%  Weight: 168 lb 11.2 oz (76.5 kg)  Height: 5\' 10"  (1.778 m)   Body mass index is 24.21 kg/m. Physical Exam Vitals and nursing note reviewed.  Constitutional:      Appearance: Normal appearance.  HENT:     Head: Normocephalic and atraumatic.     Nose: Nose normal.     Mouth/Throat:     Mouth: Mucous membranes are moist.  Eyes:     Extraocular Movements: Extraocular movements intact.     Conjunctiva/sclera: Conjunctivae normal.     Pupils: Pupils are equal, round, and reactive to light.  Cardiovascular:     Rate and Rhythm: Normal rate and regular rhythm.     Heart sounds: Murmur heard.      Comments: Left upper chest pace maker.  Pulmonary:     Breath sounds: Rales present.     Comments: Bibasilar rales.  Abdominal:     General: Bowel sounds are normal.     Palpations: Abdomen is soft.     Tenderness: There is no abdominal tenderness.  Genitourinary:    Comments: Foley, dark blood urine in Foley catheter bag Musculoskeletal:     Cervical back: Normal range of motion and neck supple.  Right lower leg: Edema present.     Left lower leg: Edema present.     Comments: Trace edema BLE. Reduced over head ROM of the right shoulder.  Positional lower  back pain remains no change.   Skin:    General: Skin is warm and dry.     Findings: No bruising.  Neurological:     General: No focal deficit present.     Mental Status: He is alert. Mental status is at baseline.     Gait: Gait abnormal.     Comments: Oriented to person, place.   Psychiatric:        Mood and Affect: Mood normal.        Behavior: Behavior normal.        Thought Content: Thought content normal.     Labs reviewed: Recent Labs    08/29/19 0233 08/29/19 0233 08/30/19 0253 08/30/19 0253 08/31/19 0205 09/01/19 0226 09/02/19 0514 09/03/19 0545  NA 137   < > 135   < > 137 138 138  --   K 3.7   < > 3.1*   < > 3.6 3.8 3.5  --   CL 102   < > 99   < > 102 99 95*  --   CO2 24   < > 27   < > 25 26 30   --   GLUCOSE 98   < > 104*   < > 114* 101* 112*  --   BUN 16   < > 15   < > 13 14 17   --   CREATININE 0.77   < > 0.72   < > 0.65 0.65 0.88  --   CALCIUM 8.1*   < > 7.7*   < > 8.4* 8.4* 8.5*  --   MG 2.7*  --   --   --  2.3  --   --  2.5*  PHOS 2.1*   < > 3.1  --  3.0  --   --  3.7   < > = values in this interval not displayed.   Recent Labs    03/15/19 0000 08/27/19 0226 08/29/19 0233  AST 14 20 33  ALT 8* 12 18  ALKPHOS 75 53 55  BILITOT  --  0.8 0.7  PROT  --  5.7* 6.1*  ALBUMIN 3.5 2.7* 2.6*   Recent Labs    08/26/19 0111 08/26/19 0127 08/31/19 0205 09/01/19 0226 09/03/19 0545  WBC 17.2*   < > 8.9 8.0 7.8  NEUTROABS 13.2*  --   --   --   --   HGB 12.4*   < > 10.3* 10.2* 11.1*  HCT 37.2*   < > 32.1* 31.7* 34.1*  MCV 99.2   < > 98.5 97.5 96.1  PLT 303   < > 218 250 322   < > = values in this interval not displayed.   Lab Results  Component Value Date   TSH 1.71 03/15/2019   Lab Results  Component Value Date   HGBA1C 5.8 (H) 08/26/2019   Lab Results  Component Value Date   CHOL 84 10/18/2019   HDL 32 (A) 10/18/2019   LDLCALC 31 10/18/2019   TRIG 133 10/18/2019   CHOLHDL 2.6 05/05/2017    Significant Diagnostic Results in last 30 days:   CUP PACEART REMOTE DEVICE CHECK  Result Date: 11/10/2019 Scheduled remote reviewed. Normal device function.  Next remote 91 days.   Assessment/Plan Blood loss anemia Blood loss anemia, s/p  3 units PRBC transfusion, Hgb 11.1 7/30/2  Neurogenic bladder   Urinary retention, chronic indwelling cath, takes Oxybutynin.   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   Hypothyroidism Hypothyroidism, takes Levothyroxine.  GERD (gastroesophageal reflux disease) GERD takes Pantoprazole   Osteoarthritis OA knee pain, left lower back, takes tylenol, Robaxin.    Combined congestive systolic and diastolic heart failure (HCC) CHF, takes Torsemide, EF 35-40%,    Cardiomyopathy, ischemic CAD, CABG x4, stents, off ASA due to hematuria. takes Plavix, Statin.   Depression, recurrent ([place]) Depression, takes Duloxetine 20mg  qd   Ventricular tachycardia ([place]) Hx of Afib/SSS s/p St Jude PPM 2010, ventricular tachycardia, takes Amiodarone     Family/ staff Communication: plan of care reviewed with the patient and charge nurse.   Labs/tests ordered:  none  Time spend 35 minutes.

## 2019-11-22 NOTE — Assessment & Plan Note (Signed)
Constipation, takes Trulance, Senokot S, MiraLax prn

## 2019-11-22 NOTE — Assessment & Plan Note (Signed)
CAD, CABG x4, stents, off ASA due to hematuria. takes Plavix, Statin.

## 2019-11-22 NOTE — Assessment & Plan Note (Signed)
CHF, takes Torsemide, EF 35-40%,

## 2019-11-22 NOTE — Assessment & Plan Note (Signed)
Blood loss anemia, s/p 3 units PRBC transfusion, Hgb 11.1 7/30/2

## 2019-11-22 NOTE — Assessment & Plan Note (Signed)
Urinary retention, chronic indwelling cath, takes Oxybutynin.

## 2019-11-22 NOTE — Assessment & Plan Note (Signed)
GERD takes Pantoprazole °

## 2019-11-22 NOTE — Assessment & Plan Note (Signed)
Depression, takes Duloxetine 20mg  qd

## 2019-11-22 NOTE — Assessment & Plan Note (Signed)
RLS, takes Ropinirole

## 2019-11-22 NOTE — Assessment & Plan Note (Signed)
Hx of Afib/SSS s/p St Jude PPM 2010, ventricular tachycardia, takes Amiodarone

## 2019-11-22 NOTE — Assessment & Plan Note (Signed)
OA knee pain, left lower back, takes tylenol, Robaxin.

## 2019-11-22 NOTE — Assessment & Plan Note (Signed)
Hypothyroidism, takes Levothyroxine.

## 2019-11-23 ENCOUNTER — Encounter: Payer: Self-pay | Admitting: Nurse Practitioner

## 2019-11-29 ENCOUNTER — Encounter: Payer: Self-pay | Admitting: Cardiovascular Disease

## 2019-11-29 ENCOUNTER — Ambulatory Visit (INDEPENDENT_AMBULATORY_CARE_PROVIDER_SITE_OTHER): Payer: Medicare PPO | Admitting: Cardiovascular Disease

## 2019-11-29 VITALS — BP 109/69 | HR 71 | Ht 70.0 in | Wt 167.0 lb

## 2019-11-29 DIAGNOSIS — Z79899 Other long term (current) drug therapy: Secondary | ICD-10-CM

## 2019-11-29 DIAGNOSIS — E7800 Pure hypercholesterolemia, unspecified: Secondary | ICD-10-CM

## 2019-11-29 DIAGNOSIS — Z5181 Encounter for therapeutic drug level monitoring: Secondary | ICD-10-CM | POA: Diagnosis not present

## 2019-11-29 DIAGNOSIS — I2510 Atherosclerotic heart disease of native coronary artery without angina pectoris: Secondary | ICD-10-CM

## 2019-11-29 DIAGNOSIS — Z950 Presence of cardiac pacemaker: Secondary | ICD-10-CM | POA: Diagnosis not present

## 2019-11-29 DIAGNOSIS — I5042 Chronic combined systolic (congestive) and diastolic (congestive) heart failure: Secondary | ICD-10-CM

## 2019-11-29 DIAGNOSIS — I495 Sick sinus syndrome: Secondary | ICD-10-CM

## 2019-11-29 DIAGNOSIS — I472 Ventricular tachycardia, unspecified: Secondary | ICD-10-CM

## 2019-11-29 DIAGNOSIS — I951 Orthostatic hypotension: Secondary | ICD-10-CM

## 2019-11-29 DIAGNOSIS — I350 Nonrheumatic aortic (valve) stenosis: Secondary | ICD-10-CM

## 2019-11-29 DIAGNOSIS — Z8673 Personal history of transient ischemic attack (TIA), and cerebral infarction without residual deficits: Secondary | ICD-10-CM

## 2019-11-29 LAB — BASIC METABOLIC PANEL
BUN: 20 (ref 4–21)
CO2: 31 — AB (ref 13–22)
Chloride: 100 (ref 99–108)
Creatinine: 0.9 (ref 0.6–1.3)
Glucose: 96
Potassium: 3.6 (ref 3.4–5.3)
Sodium: 139 (ref 137–147)

## 2019-11-29 LAB — CBC AND DIFFERENTIAL
HCT: 35 — AB (ref 41–53)
Hemoglobin: 11.6 — AB (ref 13.5–17.5)
Platelets: 232 (ref 150–399)
WBC: 6.9

## 2019-11-29 LAB — HEPATIC FUNCTION PANEL
ALT: 11 (ref 10–40)
AST: 16 (ref 14–40)
Alkaline Phosphatase: 78 (ref 25–125)
Bilirubin, Total: 0.3

## 2019-11-29 LAB — CBC: RBC: 3.66 — AB (ref 3.87–5.11)

## 2019-11-29 LAB — COMPREHENSIVE METABOLIC PANEL
Albumin: 3.4 — AB (ref 3.5–5.0)
Calcium: 8.9 (ref 8.7–10.7)
Globulin: 3

## 2019-11-29 LAB — TSH: TSH: 1.67 (ref 0.41–5.90)

## 2019-11-29 NOTE — Progress Notes (Signed)
Patient ID: Adam [name], male   DOB: 10-22-1926, 86 y.o.   MRN: [id number]    Cardiology Office Note    Date:  11/29/2019   ID:  Adam [name], DOB 12-Dec-1926, MRN [id number]  PCP:  Adam Dad, MD  Cardiologist:   Adam [name], MD   Chief complaint: pacemaker, orthostatic hypotension  History of Present Illness:  Adam [name] is a 86 y.o. male with a long-standing history of coronary artery disease and previous bypass surgery with an extensive scar from inferior wall myocardial infarction, ischemic cardiomyopathy with combined systolic and diastolic heart failure, history of sustained symptomatic ventricular tachycardia (probably "scar VT"), previous ischemic stroke, sinus node dysfunction with dual chamber permanent pacemaker (generator change 2018 [employer]), severe orthostatic hypotension that is felt to be neurogenic in etiology.  He has nonrheumatic aortic valve stenosis which has progressed to severe this year.  He had an episode of near syncope about 3 nights ago while sitting in a chair at the dining room table.  He improved after being laid flat.  He was hypotensive and was given a medication (I suspect midodrine) after which he improved.  He is extremely sedentary since he is unable to stand without assistance.  He continues to take midodrine and droxidopa which for the most part have improved his tendency to have orthostatic syncope.  He does not have lower extremity edema, although at times his right ankle is a little swollen (he did not tolerate fludrocortisone due to heart failure exacerbation).  He does not have orthopnea, PND or angina.  He has not had any falls or bleeding problems.  He is on chronic antiplatelet therapy following a November 2017 ischemic stroke, but does not take anticoagulants. He takes a very low-dose of amiodarone for his history of ventricular tachycardia, not for atrial fibrillation.  Pacemaker interrogation shows normal device function with  an estimated generator longevity of 9.4 years Music therapist generator change in 2018).  He has almost 100% atrial pacing and is "atrially dependent".  He does mostly have normal AV conduction but does also have 36% ventricular pacing.  He has not had any episodes of atrial mode switch or ventricular tachycardia since his last device download.  He does have intraventricular conduction delay with right bundle branch block plus left anterior fascicular block on his ECG.  Other than the absence of detectable P waves, all lead parameters are excellent.  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  It sounds like Adam [name] would want to participate and his children agree, but Adam [name] is very concerned about traveling to [employer] and being in the presence of a large community.  I see both her points of view.  He has CAD and is currently free of angina pectoris. He underwent bypass surgery 1996. He had placement of a stent to the SVG to RCA in 2011. His last cardiac catheterization in 2012 show that vessel to be widely patent but all his native coronary arteries are occluded and he is graft dependent. He is nuclear stress test in December 2015 showed an extensive inferior wall scar without any areas of ischemia. in June 2016 he presented with rapid palpitations associated with near-syncope, dyspnea and extreme fatigue , but did not lose consciousness. Interrogation of his pacemaker showed relatively slow ventricular tachycardia at around 150 bpm that appear to have a regular, monomorphic electrogram. His dose of amiodarone was increased and he has not  had ventricular tachycardia since. He was evaluated by Dr. Caryl [name]. After some debate, we decided that a conservative approach is most appropriate in view of his declining overall functional status and advanced age. Unfortunately has also been developing progressive dementia, primarily manifested as short-term memory loss. He is  now a resident at [facility]. In November 2017 he presented with a right lower extremity weakness and was diagnosed as having an ischemic stroke. He was prescribed an oral anticoagulant due to suspicion that his stroke was related to atrial fibrillation, but atrial fibrillation has never occurred/has never been recorded by his pacemaker so this medication was stopped.  Echo July 2019 shows stable LVEF 35-40%, moderate aortic stenosis with a mean gradient of 13 mmHg and an estimated aortic valve area of 1.2 cm    Past Medical History:  Diagnosis Date  . Arthritis    "minor, back and sometimes knees" (12/15/2012)  . Bradycardia    SSS s/p St Jude PPM 04/12/2008  . CAD (coronary artery disease) 12/30/2014   CABG (LIMA-LAD, SVG-RCA, SVG-OM in 1996).  07/2009 BMS to SVG-RCA. Cath in 04/2010 with patent stents   . Cardiomyopathy, ischemic 08/25/2012  . Chronic knee pain 12/03/2014  . Combined congestive systolic and diastolic heart failure ([place]) 02/02/2015   Hx EF 41%. BNP 96.8 02/21/15 Torsemide 04/06/15 Na 142, K 4.6, Bun 16, creat 0.89 04/20/14 BNP 111.7, Na 142, K 4.6, Bun 16, creat 0.9   . Depression with anxiety 02/02/2015   02/21/15 Hgb A1c 5.8 03/10/15 MMSE 30/30   . Dizziness, after diuretic asscoiated with hypotension and responded to fluid bolus 06/05/2011   04/28/15 US carotid R+L normal bilateral arterial velocities.    Marland Kitchen Dyspnea 08/11/2014   Followed in Pulmonary clinic/ [facility]/ [place]  - 08/11/2014  Walked RA x 1 laps @ 185 ft each stopped due to fatigue/off balance/ slow pace/  no sob or desat  - PFT's  09/26/2014  FEV1 2.26 (85 % ) ratio 76  p no % improvement from saba with DLCO  67 % corrects to 93 % for alv volume      Since prev study 08/04/13 minimal change lung vol or dlco    . Embolic cerebral infarction ([place]) 12/06/2015  . GERD (gastroesophageal reflux disease)   . Gout 02/09/2015  . Hiatal hernia   . Hyperlipidemia   . Hypertension   . Hypothyroid   . Influenza A 03/10/2016   . Insomnia   . Melanoma of back ([place]) 1976  . Myocardial infarction [facility]) 1996; 2011   "both silent" (12/15/2012)  . Nonrheumatic aortic valve stenosis   . Orthostatic hypotension   . Osteoporosis, senile   . Pacemaker   . RBBB   . Restless leg 02/02/2015  . Right leg weakness 12/06/2015  . [facility] spotted fever   . S/P CABG x 4   . Sick sinus syndrome ([place]) 01/31/2014  . Sustained ventricular tachycardia ([place]) 07/27/2014  . Urinary retention 12/30/2014   has a cathrater    Past Surgical History:  Procedure Laterality Date  . CARDIAC CATHETERIZATION  04/2010   LIMA to LAD patent,SVG to OM patent,no in-stnet restenosis RCA  . CATARACT EXTRACTION W/ INTRAOCULAR LENS  IMPLANT, BILATERAL Bilateral 2012  . CORONARY ANGIOPLASTY WITH STENT PLACEMENT  07/2009   bare metal stent to SVG to the RCA  . CORONARY ARTERY BYPASS GRAFT  1996   LIMA to LAD,SVG to RCA & SVG to OM  . INSERT / REPLACE / REMOVE PACEMAKER  2010  . MELANOMA EXCISION  05/1974 X2   "taken off my back" (12/15/2012)  . NM MYOVIEW LTD  06/2011   low risk  . PPM GENERATOR CHANGEOUT N/A 01/22/2017   Procedure: PPM GENERATOR CHANGEOUT;  Surgeon: Adam [name], MD;  Location: [place] [facility];  Service: Cardiovascular;  Laterality: N/A;  . TONSILLECTOMY  1938  . TRANSURETHRAL RESECTION OF PROSTATE  1986  . US ECHOCARDIOGRAPHY  07/11/2009   EF 45-50%    Current Medications: Outpatient Medications Prior to Visit  Medication Sig Dispense Refill  . acetaminophen (TYLENOL) 500 MG tablet Take 500 mg by mouth 3 (three) times daily.     . Amino Acids-Protein Hydrolys (FEEDING SUPPLEMENT, PRO-STAT SUGAR FREE 64,) LIQD Take 30 mLs by mouth daily.     Marland Kitchen amiodarone (PACERONE) 200 MG tablet     . Cholecalciferol (VITAMIN D3) 5000 UNITS CAPS Take 5,000 Units by mouth every Monday.     . clopidogrel (PLAVIX) 75 MG tablet Take 75 mg by mouth daily.    . clotrimazole-betamethasone (LOTRISONE) cream Apply 1 application topically  daily as needed.    . Droxidopa 100 MG CAPS Take 100 mg by mouth in the morning, at noon, and at bedtime.    . DULoxetine (CYMBALTA) 20 MG capsule Take 20 mg by mouth daily. For anxiety,insomnia and back pain    . fluticasone (FLONASE) 50 MCG/ACT nasal spray Place 1 spray into both nostrils 2 (two) times daily.    Marland Kitchen guaiFENesin (ROBITUSSIN) 100 MG/5ML SOLN Take 5 mLs (100 mg total) by mouth every 4 (four) hours as needed for cough or to loosen phlegm. 1200 mL 0  . hydrocortisone (ANUSOL-HC) 2.5 % rectal cream Place 1 application rectally 2 (two) times daily.    . Hydrocortisone-Aloe 1 % CREA Apply topically 2 (two) times daily as needed.    Marland Kitchen ketoconazole (NIZORAL) 2 % cream Apply 1 application topically as needed for irritation. Apply small amount behind ears and in creases beside nose BID x 7 days. MAY REPEAT PRN. (Family supplied).    . lactose free nutrition (BOOST PLUS) LIQD Take 237 mLs by mouth 2 (two) times daily between meals.    Marland Kitchen levothyroxine (SYNTHROID, LEVOTHROID) 100 MCG tablet Take 100 mcg by mouth daily before breakfast.    . meclizine (ANTIVERT) 25 MG tablet Take 1 tablet (25 mg total) by mouth 2 (two) times daily as needed for dizziness. 30 tablet 0  . Melatonin 3 MG TABS Take 3 mg by mouth at bedtime.    . methocarbamol (ROBAXIN) 500 MG tablet Take 250 mg by mouth 2 (two) times daily as needed for muscle spasms.     . midodrine (PROAMATINE) 5 MG tablet Take 5 mg by mouth 3 (three) times daily with meals. Notify provider if BP is < 90/60.    Marland Kitchen mineral oil-hydrophilic petrolatum (AQUAPHOR) ointment Apply 1 application topically 2 (two) times daily as needed for dry skin. To face    . oxybutynin (DITROPAN-XL) 10 MG 24 hr tablet Take 1 tablet (10 mg total) by mouth at bedtime. 30 tablet 0  . pantoprazole (PROTONIX) 40 MG tablet Take 40 mg by mouth daily.    Marland Kitchen Plecanatide (TRULANCE) 3 MG TABS Take 3 mg by mouth daily. 30 tablet 11  . polyethylene glycol (MIRALAX / GLYCOLAX) 17 g  packet Take 17 g by mouth daily as needed for mild constipation. Take 1-2 capfuls in 8 oz of H2O     . pravastatin (PRAVACHOL) 20 MG  tablet Take 20 mg by mouth daily.    Marland Kitchen rOPINIRole (REQUIP) 0.25 MG tablet Take 0.5 mg by mouth 2 (two) times daily.    . sennosides-docusate sodium (SENOKOT-S) 8.6-50 MG tablet Take 2 tablets by mouth 2 (two) times daily.     Marland Kitchen torsemide (DEMADEX) 10 MG tablet Take 20 mg by mouth daily. 20 mg once a day    . amiodarone (PACERONE) 100 MG tablet Take 1 tablet (100 mg total) by mouth daily. 90 tablet 3   No facility-administered medications prior to visit.     Allergies:   Altace [ramipril], Crestor [rosuvastatin calcium], Penicillins, Sulfa antibiotics, and Sulfamethoxazole-trimethoprim   Social History   Socioeconomic History  . Marital status: Married    Spouse name: Not on file  . Number of children: 2  . Years of education: Masters  . Highest education level: Not on file  Occupational History  . Occupation: Retired Company secretary -Pensions consultant  Tobacco Use  . Smoking status: Never Smoker  . Smokeless tobacco: Never Used  Vaping Use  . Vaping Use: Never used  Substance and Sexual Activity  . Alcohol use: No    Alcohol/week: 0.0 standard drinks  . Drug use: No  . Sexual activity: Never  Other Topics Concern  . Not on file  Social History Narrative   Lives at [place] to [place] 01/09/15   Married - Violet   Never smoked   Alcohol none   Previously employed as Scientist, forensic for [employer].         Diet:Low sodium   Do you drink/eat things with caffeine? No   Marital status: Married                              What year were you married?1950   Do you live in a house, apartment, assisted living, condo, trailer, etc)?    Is it one or more stories? 1   How many persons live in your home? 2   Do you have any pets in your home? No   Current or past profession: Minister, [employer]iorendent   Do you exercise?     Very  Little                                                 Type & how often:    Do you have a living will?  Yes   Do you have a DNR Form? Yes   Do you have a POA/HPOA forms? Yes   Social Determinants of Health   Financial Resource Strain:   . Difficulty of Paying Living Expenses: Not on file  Food Insecurity:   . Worried About Charity fundraiser in the Last Year: Not on file  . Ran Out of Food in the Last Year: Not on file  Transportation Needs:   . Lack of Transportation (Medical): Not on file  . Lack of Transportation (Non-Medical): Not on file  Physical Activity:   . Days of Exercise per Week: Not on file  . Minutes of Exercise per Session: Not on file  Stress:   . Feeling of Stress : Not on file  Social Connections:   . Frequency of Communication with Friends and Family: Not on file  . Frequency of Social Gatherings with Friends  and Family: Not on file  . Attends Religious Services: Not on file  . Active Member of Clubs or Organizations: Not on file  . Attends Archivist Meetings: Not on file  . Marital Status: Not on file     Family History:  The patient's family history includes Anuerysm in his son; Coronary artery disease in his father and mother; Diabetes in his father and mother; Heart disease in his brother, father, and mother; Lung cancer in his father.   ROS:   Please see the history of present illness.    ROS All other systems are reviewed and are negative.   PHYSICAL EXAM:   VS:  BP 109/69   Pulse 71   Ht 5\' 10"  (1.778 m)   Wt 167 lb (75.8 kg)   SpO2 94%   BMI 23.96 kg/m      General: Alert, oriented x3, no distress, elderly and frail, healthy left subclavian pacemaker site. Head: no evidence of trauma, PERRL, EOMI, no exophtalmos or lid lag, no myxedema, no xanthelasma; normal ears, nose and oropharynx Neck: normal jugular venous pulsations and no hepatojugular reflux; brisk carotid pulses without delay and no carotid bruits Chest: clear to  auscultation, no signs of consolidation by percussion or palpation, normal fremitus, symmetrical and full respiratory excursions Cardiovascular: normal position and quality of the apical impulse, regular rhythm, normal first and muffled second heart sounds, mid to late peaking systolic ejection murmur in the aortic focus with radiation towards the neck, no diastolic murmurs, rubs or gallops Abdomen: no tenderness or distention, no masses by palpation, no abnormal pulsatility or arterial bruits, normal bowel sounds, no hepatosplenomegaly Extremities: no clubbing, cyanosis or edema; 2+ radial, ulnar and brachial pulses bilaterally; 2+ right femoral, posterior tibial and dorsalis pedis pulses; 2+ left femoral, posterior tibial and dorsalis pedis pulses; no subclavian or femoral bruits Neurological: grossly nonfocal Psych: Normal mood and affect   Wt Readings from Last 3 Encounters:  11/29/19 167 lb (75.8 kg)  11/22/19 168 lb 11.2 oz (76.5 kg)  11/10/19 168 lb 1.6 oz (76.2 kg)      Studies/Labs Reviewed:   EKG:  EKG is ordered today.  It shows atrial paced, ventricular sensed rhythm with chronic right bundle branch block plus left anterior fascicular block and a very broad QRS at 162 ms.  QTc 580 ms. ECHO July 2021   1. Left ventricular ejection fraction, by estimation, is 40 to 45%. The  left ventricle has mildly decreased function. The left ventricle  demonstrates regional wall motion abnormalities (see scoring  diagram/findings for description). Basal to mid  inferior/inferolateral akinesis. There is severe asymmetric left  ventricular hypertrophy of the basal-septal segment. Left ventricular  diastolic parameters are indeterminate.  2. Right ventricular systolic function is mildly reduced. The right  ventricular size is mildly enlarged. Tricuspid regurgitation signal is  inadequate for assessing PA pressure.  3. The mitral valve is abnormal. Moderately calcified leaflets. No   evidence of mitral valve regurgitation.  4. Aortic dilatation noted. There is mild dilatation of the ascending  aorta measuring 38 mm.  5. The aortic valve is abnormal. Severely calcified. Aortic valve  regurgitation is mild. Severe aortic valve stenosis. Vmax 3.7 m/s, MG 35  mmHg, AVA 0.5 cm^2, DI 0.13.   Recent Labs: 03/15/2019: TSH 1.71 08/29/2019: ALT 18 09/02/2019: BUN 17; Creatinine, Ser 0.88; Potassium 3.5; Sodium 138 09/03/2019: Hemoglobin 11.1; Magnesium 2.5; Platelets 322   Lipid Panel    Component Value Date/Time   CHOL  84 10/18/2019 0000   TRIG 133 10/18/2019 0000   HDL 32 (A) 10/18/2019 0000   CHOLHDL 2.6 05/05/2017 0000   VLDL 20 12/07/2015 0449   LDLCALC 31 10/18/2019 0000   LDLCALC 43 05/05/2017 0000     ASSESSMENT:    1. Ventricular tachycardia ([place])   2. Encounter for monitoring amiodarone therapy   3. SSS (sick sinus syndrome) ([place])   4. Pacemaker   5. Chronic combined systolic (congestive) and diastolic (congestive) heart failure ([place])   6. Orthostatic hypotension   7. Coronary artery disease involving native coronary artery of native heart without angina pectoris   8. History of arterial ischemic stroke   9. Hypercholesterolemia   10. Aortic valve stenosis, nonrheumatic      PLAN:  In order of problems listed above:  1. VT: This has not been detected in over 4 years on amiodarone therapy.  Previously had been on sotalol but this was stopped when he developed orthostatic hypotension. 2. Amiodarone: No major side effects.  Reminded him of the need to check his thyroid function tests and liver function tests every 6 months.  He has had recent LFTs that were normal but needs a TSH.  He does not have symptoms of hypothyroidism. 3. SSS: He does not have any intrinsic atrial activity and this was confirmed again today when atrial pacing was taken down to 30 bpm.  He is "atrially dependent".  Although he has significant infrahisian conduction abnormalities,  he only requires 36% ventricular pacing. 4. PM: Office visit usually every 6 months, remote downloads in between.  During the coronavirus pandemic it is reasonable to see him just once a year. 5. CHF: He appears to be euvolemic without the need for loop diuretics.  He should not be administered fludrocortisone which has caused heart failure exacerbation in the past. 6. Orthostatic hypotension: Hard to say whether his recent presyncopal event was due to his usual problems with orthostatic hypotension or was a sign of worsening aortic stenosis.  He never performs physical exertion so it is hard to bring out the symptoms of AAS. 7. CAD: Denies angina pectoris.; has an extensive scar (probably the source of his previous VT), but no reversible ischemia on his most recent nuclear study from November 2014. He has occluded native arteries and is graft dependent (LIMA to LAD, SVG to RCA, SVG to OM; status post stent in SVG to RCA 2011, patent by cath March 2012). November 2014 nuclear study shows inferior scar without ischemia, EF 41%.Conservative management is recommended. 8. History of CVA: On clopidogrel. His pacemaker has never shown evidence of atrial arrhythmia (flutter or fibrillation) that could explain his stroke.  He does not need anticoagulants 9. HLP: LDL well within target range on low-dose of pravastatin. 10. AS: This has reached severe range, but is really not causing symptoms since he is so sedentary.  He is definitely not a candidate for SAVR.  We have discussed at least twice options for TAVR but have agreed with the family that due to his age and frailty even this would be an unacceptable risk and will may worsen his quality of life rather than improve it.  They all understand that aortic stenosis is a progressive disease and will eventually claim his life.  Their goal is to fill his days with important activities that are meaningful to him and make him happy.  For that reason, I encourageb Mrs.  Puls to allow Krrish to g participate in the celebration that the  [employer] have offered him.   Medication Adjustments/Labs and Tests Ordered: Current medicines are reviewed at length with the patient today.  Concerns regarding medicines are outlined above.  Medication changes, Labs and Tests ordered today are listed in the Patient Instructions below. Patient Instructions  Medication Instructions:  No changes *If you need a refill on your cardiac medications before your next appointment, please call your pharmacy*   Lab Work: None ordered If you have labs (blood work) drawn today and your tests are completely normal, you will receive your results only by: Marland Kitchen MyChart Message (if you have MyChart) OR . A paper copy in the mail If you have any lab test that is abnormal or we need to change your treatment, we will call you to review the results.   Testing/Procedures: None ordered   Follow-Up: At [facility], you and your health needs are our priority.  As part of our continuing mission to provide you with exceptional heart care, we have created designated Provider Care Teams.  These Care Teams include your primary Cardiologist (physician) and Advanced Practice Providers (APPs -  Physician Assistants and Nurse Practitioners) who all work together to provide you with the care you need, when you need it.  We recommend signing up for the patient portal called "MyChart".  Sign up information is provided on this After Visit Summary.  MyChart is used to connect with patients for Virtual Visits (Telemedicine).  Patients are able to view lab/test results, encounter notes, upcoming appointments, etc.  Non-urgent messages can be sent to your provider as well.   To learn more about what you can do with MyChart, go to [email or web address].    Your next appointment:   6 month(s)  The format for your next appointment:   In Person  Provider:   [name], MD      Signed, Adam [name],  MD  11/29/2019 7:39 PM    [facility] [place], [place], [place]  [address] Phone: [phone]; Fax: [phone]

## 2019-11-29 NOTE — Patient Instructions (Signed)

## 2019-12-02 ENCOUNTER — Other Ambulatory Visit: Payer: Self-pay | Admitting: Cardiovascular Disease

## 2019-12-03 ENCOUNTER — Encounter: Payer: Self-pay | Admitting: Cardiovascular Disease

## 2019-12-03 NOTE — Telephone Encounter (Signed)
error 

## 2019-12-08 ENCOUNTER — Encounter: Payer: Self-pay | Admitting: Internal Medicine

## 2019-12-08 ENCOUNTER — Non-Acute Institutional Stay (SKILLED_NURSING_FACILITY): Payer: Medicare PPO | Admitting: Internal Medicine

## 2019-12-08 DIAGNOSIS — I350 Nonrheumatic aortic (valve) stenosis: Secondary | ICD-10-CM | POA: Diagnosis not present

## 2019-12-08 DIAGNOSIS — I5040 Unspecified combined systolic (congestive) and diastolic (congestive) heart failure: Secondary | ICD-10-CM

## 2019-12-08 DIAGNOSIS — R059 Cough, unspecified: Secondary | ICD-10-CM | POA: Diagnosis not present

## 2019-12-08 DIAGNOSIS — I951 Orthostatic hypotension: Secondary | ICD-10-CM | POA: Diagnosis not present

## 2019-12-08 NOTE — Progress Notes (Signed)
Location:    [facility] Room Number: 29 Place of Service:  SNF [phone]) Provider:  Veleta Miners MD  [name], MD  Patient Care Team: [name], MD as PCP - General (Internal Medicine) [name], MD as PCP - Cardiology (Cardiology) [name], MD as Attending Physician (Cardiology) Vevelyn Royals, MD as Consulting Physician (Ophthalmology) Franchot Gallo, MD as Consulting Physician (Urology) Tanda Rockers, MD as Consulting Physician (Pulmonary Disease) Allyn Kenner, MD (Dermatology) Deliah Goody, PA-C as Physician Assistant (Physician Assistant) Mast, Man X, NP as Nurse Practitioner (Internal Medicine) Ngetich, Nelda Bucks, NP as Nurse Practitioner (Family Medicine)  Extended Emergency Contact Information Primary Emergency Contact: [name] of [place] Phone: [phone] Relation: Daughter Secondary Emergency Contact: Darcey,[name] Address: [address]          [place] Phone: [phone] Mobile Phone: [phone] Relation: Spouse  Code Status:  DNR Goals of care: Advanced Directive information Advanced Directives 11/22/2019  Does Patient Have a Medical Advance Directive? Yes  Type of Paramedic of [place];Out of facility DNR (pink MOST or yellow form)  Does patient want to make changes to medical advance directive? No - Patient declined  Copy of Marks in Chart? Yes - validated most recent copy scanned in chart (See row information)  Would patient like information on creating a medical advance directive? -  Pre-existing out of facility DNR order (yellow form or pink MOST form) Pink MOST form placed in chart (order not valid for inpatient use)     Chief Complaint  Patient presents with  . Acute Visit    Cough    HPI:  Pt is a 86 y.o. male seen today for an acute visit for cough and shortness of breath  especially at night when he is in bed  He has h/o CAD s/p Bypass surgery with Extensive Inferior Wall Scarring, Ischemic Cardiomyopathy withsystolic heart failureEF of 40-45% severe  AS, history of ventricular tachycardia stable on amiodarone, sinus node dysfunction with dual-chamber PPP,  severe orthostatic hypotension neurogenic, history of compression fractures, history of ischemic stroke  .,History of chronic indwelling Foley catheter  Was seen today as patient has been having some cough for the past few weeks.  He states is worse when he is lying down at night.  Also complaining of some shortness of breath.  Denies any rhinorrhea denies any postnasal drip.  Denies any chest pain fever. Does have a history of orthostatic hypotension.  It has been stable recently His weight is stable  Past Medical History:  Diagnosis Date  . Arthritis    "minor, back and sometimes knees" (12/15/2012)  . Bradycardia    SSS s/p St Jude PPM 04/12/2008  . CAD (coronary artery disease) 12/30/2014   CABG (LIMA-LAD, SVG-RCA, SVG-OM in 1996).  07/2009 BMS to SVG-RCA. Cath in 04/2010 with patent stents   . Cardiomyopathy, ischemic 08/25/2012  . Chronic knee pain 12/03/2014  . Combined congestive systolic and diastolic heart failure ([place]) 02/02/2015   Hx EF 41%. BNP 96.8 02/21/15 Torsemide 04/06/15 Na 142, K 4.6, Bun 16, creat 0.89 04/20/14 BNP 111.7, Na 142, K 4.6, Bun 16, creat 0.9   . Depression with anxiety 02/02/2015   02/21/15 Hgb A1c 5.8 03/10/15 MMSE 30/30   . Dizziness, after diuretic asscoiated with hypotension and responded to fluid bolus 06/05/2011   04/28/15 US carotid R+L normal bilateral arterial velocities.    Marland Kitchen  Dyspnea 08/11/2014   Followed in Pulmonary clinic/ [facility]/ [facility]  - 08/11/2014  Walked RA x 1 laps @ 185 ft each stopped due to fatigue/off balance/ slow pace/  no sob or desat  - PFT's  09/26/2014  FEV1 2.26 (85 % ) ratio 76  p no % improvement from saba with DLCO  67 % corrects to 93 %  for alv volume      Since prev study 08/04/13 minimal change lung vol or dlco    . Embolic cerebral infarction ([place]) 12/06/2015  . GERD (gastroesophageal reflux disease)   . Gout 02/09/2015  . Hiatal hernia   . Hyperlipidemia   . Hypertension   . Hypothyroid   . Influenza A 03/10/2016  . Insomnia   . Melanoma of back ([place]) 1976  . Myocardial infarction [facility]) 1996; 2011   "both silent" (12/15/2012)  . Nonrheumatic aortic valve stenosis   . Orthostatic hypotension   . Osteoporosis, senile   . Pacemaker   . RBBB   . Restless leg 02/02/2015  . Right leg weakness 12/06/2015  . [facility] spotted fever   . S/P CABG x 4   . Sick sinus syndrome ([place]) 01/31/2014  . Sustained ventricular tachycardia ([place]) 07/27/2014  . Urinary retention 12/30/2014   has a cathrater   Past Surgical History:  Procedure Laterality Date  . CARDIAC CATHETERIZATION  04/2010   LIMA to LAD patent,SVG to OM patent,no in-stnet restenosis RCA  . CATARACT EXTRACTION W/ INTRAOCULAR LENS  IMPLANT, BILATERAL Bilateral 2012  . CORONARY ANGIOPLASTY WITH STENT PLACEMENT  07/2009   bare metal stent to SVG to the RCA  . CORONARY ARTERY BYPASS GRAFT  1996   LIMA to LAD,SVG to RCA & SVG to OM  . INSERT / REPLACE / REMOVE PACEMAKER  2010  . MELANOMA EXCISION  05/1974 X2   "taken off my back" (12/15/2012)  . NM MYOVIEW LTD  06/2011   low risk  . PPM GENERATOR CHANGEOUT N/A 01/22/2017   Procedure: PPM GENERATOR CHANGEOUT;  Surgeon: [name], MD;  Location: [place] [facility];  Service: Cardiovascular;  Laterality: N/A;  . TONSILLECTOMY  1938  . TRANSURETHRAL RESECTION OF PROSTATE  1986  . US ECHOCARDIOGRAPHY  07/11/2009   EF 45-50%    Allergies  Allergen Reactions  . Altace [Ramipril] Cough  . Crestor [Rosuvastatin Calcium] Rash  . Penicillins Rash and Other (See Comments)    Has patient had a PCN reaction causing immediate rash, facial/tongue/throat swelling, SOB or lightheadedness with hypotension: Yes Has  patient had a PCN reaction causing severe rash involving mucus membranes or skin necrosis: No Has patient had a PCN reaction that required hospitalization No Has patient had a PCN reaction occurring within the last 10 years: No If all of the above answers are "NO", then may proceed with Cephalosporin use.   . Sulfa Antibiotics Rash  . Sulfamethoxazole-Trimethoprim Rash    Allergies as of 12/08/2019      Reactions   Altace [ramipril] Cough   Crestor [rosuvastatin Calcium] Rash   Penicillins Rash, Other (See Comments)   Has patient had a PCN reaction causing immediate rash, facial/tongue/throat swelling, SOB or lightheadedness with hypotension: Yes Has patient had a PCN reaction causing severe rash involving mucus membranes or skin necrosis: No Has patient had a PCN reaction that required hospitalization No Has patient had a PCN reaction occurring within the last 10 years: No If all of the above answers are "NO", then may proceed with Cephalosporin  use.   Sulfa Antibiotics Rash   Sulfamethoxazole-trimethoprim Rash      Medication List       Accurate as of December 08, 2019 10:35 AM. If you have any questions, ask your nurse or doctor.        acetaminophen 500 MG tablet Commonly known as: TYLENOL Take 500 mg by mouth 3 (three) times daily.   amiodarone 100 MG tablet Commonly known as: PACERONE Take 100 mg by mouth daily.   Anusol-HC 2.5 % rectal cream Generic drug: hydrocortisone Place 1 application rectally 2 (two) times daily.   clopidogrel 75 MG tablet Commonly known as: PLAVIX Take 75 mg by mouth daily.   clotrimazole-betamethasone cream Commonly known as: LOTRISONE Apply 1 application topically daily as needed.   Droxidopa 100 MG Caps Take 100 mg by mouth in the morning, at noon, and at bedtime.   DULoxetine 20 MG capsule Commonly known as: CYMBALTA Take 20 mg by mouth daily. For anxiety,insomnia and back pain   feeding supplement (PRO-STAT SUGAR FREE 64) Liqd  Take 30 mLs by mouth daily.   fluticasone 50 MCG/ACT nasal spray Commonly known as: FLONASE Place 1 spray into both nostrils 2 (two) times daily.   guaiFENesin 100 MG/5ML Soln Commonly known as: ROBITUSSIN Take 5 mLs (100 mg total) by mouth every 4 (four) hours as needed for cough or to loosen phlegm.   Hydrocortisone-Aloe 1 % Crea Apply topically 2 (two) times daily as needed.   ketoconazole 2 % cream Commonly known as: NIZORAL Apply 1 application topically as needed for irritation. Apply small amount behind ears and in creases beside nose BID x 7 days. MAY REPEAT PRN. (Family supplied).   lactose free nutrition Liqd Take 237 mLs by mouth 2 (two) times daily between meals.   levothyroxine 100 MCG tablet Commonly known as: SYNTHROID Take 100 mcg by mouth daily before breakfast.   meclizine 25 MG tablet Commonly known as: ANTIVERT Take 1 tablet (25 mg total) by mouth 2 (two) times daily as needed for dizziness.   melatonin 3 MG Tabs tablet Take 3 mg by mouth at bedtime.   methocarbamol 500 MG tablet Commonly known as: ROBAXIN Take 250 mg by mouth 2 (two) times daily as needed for muscle spasms.   midodrine 5 MG tablet Commonly known as: PROAMATINE Take 5 mg by mouth 3 (three) times daily with meals. Notify provider if BP is < 90/60.   mineral oil-hydrophilic petrolatum ointment Apply 1 application topically 2 (two) times daily as needed for dry skin. To face   oxybutynin 10 MG 24 hr tablet Commonly known as: DITROPAN-XL Take 1 tablet (10 mg total) by mouth at bedtime.   pantoprazole 40 MG tablet Commonly known as: PROTONIX Take 40 mg by mouth daily.   Plecanatide 3 MG Tabs Commonly known as: Trulance Take 3 mg by mouth daily.   polyethylene glycol 17 g packet Commonly known as: MIRALAX / GLYCOLAX Take 17 g by mouth daily as needed for mild constipation. Take 1-2 capfuls in 8 oz of H2O   pravastatin 20 MG tablet Commonly known as: PRAVACHOL Take 20 mg by  mouth daily.   rOPINIRole 0.25 MG tablet Commonly known as: REQUIP Take 0.5 mg by mouth 2 (two) times daily.   rOPINIRole 0.25 MG tablet Commonly known as: REQUIP Take 0.25 mg by mouth daily as needed.   sennosides-docusate sodium 8.6-50 MG tablet Commonly known as: SENOKOT-S Take 2 tablets by mouth 2 (two) times daily.   torsemide 10  MG tablet Commonly known as: DEMADEX Take 20 mg by mouth daily. 20 mg once a day   Vitamin D3 125 MCG (5000 UT) Caps Take 5,000 Units by mouth every Monday.       Review of Systems  Constitutional: Positive for activity change.  HENT: Positive for congestion.   Respiratory: Positive for cough and shortness of breath.   Cardiovascular: Positive for leg swelling. Negative for chest pain.  Gastrointestinal: Negative.   Genitourinary: Negative.   Musculoskeletal: Positive for gait problem.  Neurological: Positive for weakness.  Psychiatric/Behavioral: Positive for sleep disturbance.    Immunization History  Administered Date(s) Administered  . Influenza, High Dose Seasonal PF 11/01/2014, 11/13/2016, 11/19/2018, 11/19/2018  . Influenza,inj,Quad PF,6+ Mos 11/24/2017  . Influenza,inj,quad, With Preservative 11/17/2017  . Influenza-Unspecified 12/05/2013, 11/05/2014, 11/16/2015, 11/09/2019  . Moderna SARS-COVID-2 Vaccination 02/08/2019, 03/08/2019  . PPD Test 03/07/2014  . Pneumococcal Conjugate-13 10/05/2009  . Pneumococcal Polysaccharide-23 11/05/2005  . Pneumococcal-Unspecified 10/05/2009  . Tdap 08/02/2014  . Zoster 11/05/2005   Pertinent  Health Maintenance Due  Topic Date Due  . DEXA SCAN  12/14/2024  . INFLUENZA VACCINE  Completed  . PNA vac Low Risk Adult  Completed   Fall Risk  10/09/2017 10/02/2016 10/30/2015 09/05/2015 02/02/2015  Falls in the past year? No No No No No  Risk for fall due to : - - Impaired balance/gait - -   Functional Status Survey:    Vitals:   12/08/19 1003  BP: 110/68  Pulse: 70  Resp: 18  Temp: 98.6  F (37 C)  SpO2: 90%  Weight: 165 lb 6.4 oz (75 kg)  Height: 5\' 10"  (1.778 m)   Body mass index is 23.73 kg/m. Physical Exam Vitals reviewed.  Constitutional:      Appearance: Normal appearance.  HENT:     Head: Normocephalic.     Nose: Nose normal.     Mouth/Throat:     Mouth: Mucous membranes are moist.     Pharynx: Oropharynx is clear.  Eyes:     Pupils: Pupils are equal, round, and reactive to light.  Cardiovascular:     Rate and Rhythm: Normal rate.     Heart sounds: Murmur heard.   Pulmonary:     Effort: Pulmonary effort is normal.     Breath sounds: Rales present.     Comments: Fine Crackles bilateral Abdominal:     General: Abdomen is flat. Bowel sounds are normal.     Palpations: Abdomen is soft.  Musculoskeletal:        General: Swelling present.     Cervical back: Neck supple.  Skin:    General: Skin is warm.  Neurological:     General: No focal deficit present.     Mental Status: He is alert.  Psychiatric:        Mood and Affect: Mood normal.        Thought Content: Thought content normal.     Labs reviewed: Recent Labs    08/29/19 0233 08/29/19 0233 08/30/19 0253 08/30/19 0253 08/31/19 0205 09/01/19 0226 09/02/19 0514 09/03/19 0545  NA 137   < > 135   < > 137 138 138  --   K 3.7   < > 3.1*   < > 3.6 3.8 3.5  --   CL 102   < > 99   < > 102 99 95*  --   CO2 24   < > 27   < > 25 26 30   --  GLUCOSE 98   < > 104*   < > 114* 101* 112*  --   BUN 16   < > 15   < > 13 14 17   --   CREATININE 0.77   < > 0.72   < > 0.65 0.65 0.88  --   CALCIUM 8.1*   < > 7.7*   < > 8.4* 8.4* 8.5*  --   MG 2.7*  --   --   --  2.3  --   --  2.5*  PHOS 2.1*   < > 3.1  --  3.0  --   --  3.7   < > = values in this interval not displayed.   Recent Labs    03/15/19 0000 08/27/19 0226 08/29/19 0233  AST 14 20 33  ALT 8* 12 18  ALKPHOS 75 53 55  BILITOT  --  0.8 0.7  PROT  --  5.7* 6.1*  ALBUMIN 3.5 2.7* 2.6*   Recent Labs    08/26/19 0111 08/26/19 0127  08/31/19 0205 09/01/19 0226 09/03/19 0545  WBC 17.2*   < > 8.9 8.0 7.8  NEUTROABS 13.2*  --   --   --   --   HGB 12.4*   < > 10.3* 10.2* 11.1*  HCT 37.2*   < > 32.1* 31.7* 34.1*  MCV 99.2   < > 98.5 97.5 96.1  PLT 303   < > 218 250 322   < > = values in this interval not displayed.   Lab Results  Component Value Date   TSH 1.71 03/15/2019   Lab Results  Component Value Date   HGBA1C 5.8 (H) 08/26/2019   Lab Results  Component Value Date   CHOL 84 10/18/2019   HDL 32 (A) 10/18/2019   LDLCALC 31 10/18/2019   TRIG 133 10/18/2019   CHOLHDL 2.6 05/05/2017    Significant Diagnostic Results in last 30 days:  CUP PACEART REMOTE DEVICE CHECK  Result Date: 11/10/2019 Scheduled remote reviewed. Normal device function.  Next remote 91 days.   Assessment/Plan  Cough with SOB Ordered Chest Xray  Addendum  Chest Xray showed Vascular congestion  Will increase his Demadex to 20 mg in am and add 10 mg in PM for 1 week.  Repeat BMP He is already on Protonix, Flonase and Robitussin  Combined systolic and diastolic congestive heart failure, unspecified HF chronicity (HCC) Will increase Demadex to 30 mg  for 1 week and reval  Aortic valve stenosis, nonrheumatic Conservative management Orthostatic hypotension Caution with Diuretics On Midodrine and [name]   Other issues Coronary artery disease  Plavix and Statin  Ventricular tachycardia (HCC) Continue on amiodarone  Chronic constipation On Trulance Hyperlipidemia Change Prevachol to 20 mg QD Hematuria Was taken off aspirin ON Plavix and doing well  Hypothyroidism due to medication TSH normalin 2/21 RLS (restless legs syndrome) On Requip Family/ staff Communication:   Labs/tests ordered:  BMP and TSH

## 2019-12-09 ENCOUNTER — Telehealth: Payer: Self-pay

## 2019-12-09 MED ORDER — DROXIDOPA 100 MG PO CAPS: 100.0000 mg | ORAL_CAPSULE | Freq: Three times a day (TID) | ORAL | 3 refills | Status: AC

## 2019-12-09 NOTE — Telephone Encounter (Signed)
Refilled northera to [facility] specialty pharmacy

## 2019-12-09 NOTE — Telephone Encounter (Signed)
[name] from [facility] stating that the pt needed a new rx but didn't state for which med. They just left a msg on the CVRR machine. Will route to the [facility] refill Pool

## 2019-12-16 LAB — BASIC METABOLIC PANEL
BUN: 17 (ref 4–21)
CO2: 30 — AB (ref 13–22)
Chloride: 100 (ref 99–108)
Creatinine: 0.9 (ref 0.6–1.3)
Glucose: 95
Potassium: 3.1 — AB (ref 3.4–5.3)
Sodium: 141 (ref 137–147)

## 2019-12-16 LAB — TSH: TSH: 0.6 (ref 0.41–5.90)

## 2019-12-16 LAB — COMPREHENSIVE METABOLIC PANEL: Calcium: 8.3 — AB (ref 8.7–10.7)

## 2019-12-19 ENCOUNTER — Telehealth: Payer: Self-pay | Admitting: Adult Health

## 2019-12-19 NOTE — Telephone Encounter (Signed)
Nurse from [facility] called to report that Adam [name] has had a cough for 10 days. Now his non productive cough is worsening and his sats dropped to the 80's but improved with 3 liters of oxygen to 93%.  He is also weak and lethargy with decreased appetite. He is eating and drinking but with reduced intake approx 25%.  Other vitals are WNL. He is a DNR but his family would like treatment for the cough. They are requesting antibiotics and an xray. A CXR was ordered 10 days ago which was WNL. The mobile xray team will not be able to come out to [facility] until tomorrow. Due to his persistent symptoms will prescribed Doxycycline 100 mg bid x 7 days and order another follow up CXR. I also let the nurse know to isolate this resident and send a covid swab. Labs BMP and CBC will be drawn in the morning as well.

## 2019-12-20 ENCOUNTER — Non-Acute Institutional Stay (SKILLED_NURSING_FACILITY): Payer: Medicare PPO | Admitting: Nurse Practitioner

## 2019-12-20 ENCOUNTER — Encounter: Payer: Self-pay | Admitting: Nurse Practitioner

## 2019-12-20 DIAGNOSIS — J209 Acute bronchitis, unspecified: Secondary | ICD-10-CM

## 2019-12-20 DIAGNOSIS — M159 Polyosteoarthritis, unspecified: Secondary | ICD-10-CM

## 2019-12-20 DIAGNOSIS — E032 Hypothyroidism due to medicaments and other exogenous substances: Secondary | ICD-10-CM

## 2019-12-20 DIAGNOSIS — G903 Multi-system degeneration of the autonomic nervous system: Secondary | ICD-10-CM

## 2019-12-20 DIAGNOSIS — F339 Major depressive disorder, recurrent, unspecified: Secondary | ICD-10-CM | POA: Diagnosis not present

## 2019-12-20 DIAGNOSIS — I5040 Unspecified combined systolic (congestive) and diastolic (congestive) heart failure: Secondary | ICD-10-CM

## 2019-12-20 DIAGNOSIS — G2581 Restless legs syndrome: Secondary | ICD-10-CM

## 2019-12-20 DIAGNOSIS — R339 Retention of urine, unspecified: Secondary | ICD-10-CM

## 2019-12-20 DIAGNOSIS — D500 Iron deficiency anemia secondary to blood loss (chronic): Secondary | ICD-10-CM

## 2019-12-20 DIAGNOSIS — I2510 Atherosclerotic heart disease of native coronary artery without angina pectoris: Secondary | ICD-10-CM

## 2019-12-20 DIAGNOSIS — I472 Ventricular tachycardia, unspecified: Secondary | ICD-10-CM

## 2019-12-20 DIAGNOSIS — K5909 Other constipation: Secondary | ICD-10-CM

## 2019-12-20 DIAGNOSIS — K219 Gastro-esophageal reflux disease without esophagitis: Secondary | ICD-10-CM

## 2019-12-20 DIAGNOSIS — E876 Hypokalemia: Secondary | ICD-10-CM | POA: Diagnosis not present

## 2019-12-20 LAB — CBC AND DIFFERENTIAL
HCT: 32 — AB (ref 41–53)
Hemoglobin: 11.1 — AB (ref 13.5–17.5)
Platelets: 331 (ref 150–399)
WBC: 8

## 2019-12-20 LAB — BASIC METABOLIC PANEL
BUN: 14 (ref 4–21)
CO2: 31 — AB (ref 13–22)
Chloride: 100 (ref 99–108)
Creatinine: 0.8 (ref 0.6–1.3)
Glucose: 97
Potassium: 3.5 (ref 3.4–5.3)
Sodium: 139 (ref 137–147)

## 2019-12-20 LAB — CBC: RBC: 3.52 — AB (ref 3.87–5.11)

## 2019-12-20 LAB — COMPREHENSIVE METABOLIC PANEL
Calcium: 8.4 — AB (ref 8.7–10.7)
GFR calc Af Amer: 91
GFR calc non Af Amer: 79

## 2019-12-20 NOTE — Assessment & Plan Note (Signed)
Hx of Afib/SSS s/p St Jude PPM 2010, ventricular tachycardia, takes Amiodarone

## 2019-12-20 NOTE — Assessment & Plan Note (Signed)
Hypokalemia, K 3.1 12/16/19, added Kcl, pending BMP

## 2019-12-20 NOTE — Assessment & Plan Note (Addendum)
Acute bronchitis vs PNA vs CHF vs GERD? non productive cough x 10 days, O2 at 2lpm to keep Sat O2>90%, c/o  weak, fatigue, decreased appetite. Pending CXR, CBC, BMP. Negative COVID. Doxycycline 153m bid x 7 days started 12/19/19 given clinical symptoms. The patient is afebrile today. CXR 2 weeks ago showed vascular congestion, Torsemide was increased to 235mqam, added 1075mn pm x 1week.  12/20/19 CXR mil stable cardiomegaly w/o overt CHF, no acute parenchymal pathology, no significant interval change compared to 01/01/19 12/20/19 wbc 8.0, Hgb 11.1, plt 331, Na 139, K 3.5, Bun 14, creat 0.80, eGFR 79

## 2019-12-20 NOTE — Assessment & Plan Note (Signed)
CAD, CABG x4, stents,off ASA due to hematuria.takes Plavix, Statin.

## 2019-12-20 NOTE — Assessment & Plan Note (Signed)
RLS, takes Ropinirole

## 2019-12-20 NOTE — Assessment & Plan Note (Addendum)
Hypothyroidism, takes Levothyroxine. TSH 1.67 11/25/19

## 2019-12-20 NOTE — Assessment & Plan Note (Signed)
Orthostatic hypotension, neurogenic,tales Droxidopa, Midodrine.

## 2019-12-20 NOTE — Assessment & Plan Note (Signed)
GERD takes Pantoprazole °

## 2019-12-20 NOTE — Progress Notes (Addendum)
Location:   [facility] Room Number: 38 Place of Service:  SNF (31) Provider: Lennie Odor Stormy Connon NP  [name], MD  Patient Care Team: [name], MD as PCP - General (Internal Medicine) [name], MD as PCP - Cardiology (Cardiology) [name], MD as Attending Physician (Cardiology) Vevelyn Royals, MD as Consulting Physician (Ophthalmology) Franchot Gallo, MD as Consulting Physician (Urology) Tanda Rockers, MD as Consulting Physician (Pulmonary Disease) Allyn Kenner, MD (Dermatology) Deliah Goody, PA-C as Physician Assistant (Physician Assistant) Brittainy Bucker X, NP as Nurse Practitioner (Internal Medicine) Ngetich, Nelda Bucks, NP as Nurse Practitioner (Family Medicine)  Extended Emergency Contact Information Primary Emergency Contact: [name] of [place] Phone: [phone] Relation: Daughter Secondary Emergency Contact: Kunz,[name] Address: [address]          [place] Phone: [phone] Mobile Phone: [phone] Relation: Spouse  Code Status:  DNR Goals of care: Advanced Directive information Advanced Directives 11/22/2019  Does Patient Have a Medical Advance Directive? Yes  Type of Paramedic of [place];Out of facility DNR (pink MOST or yellow form)  Does patient want to make changes to medical advance directive? No - Patient declined  Copy of [place] in Chart? Yes - validated most recent copy scanned in chart (See row information)  Would patient like information on creating a medical advance directive? -  Pre-existing out of facility DNR order (yellow form or pink MOST form) Pink MOST form placed in chart (order not valid for inpatient use)     Chief Complaint  Patient presents with  . Acute Visit    Cough    HPI:  Pt is a 86 y.o. male seen today for an acute visit for reported non productive cough x 10 days, O2 at 2lpm  to keep Sat O2>90%, c/o  weak, fatigue, decreased appetite. Pending CXR, CBC, BMP. Negative COVID. Doxycycline 15m bid x 7 days started 12/19/19 given clinical symptoms. The patient is afebrile today. CXR 2 weeks ago showed vascular congestion, Torsemide was increased to 276mqam, added 1073mn pm x 1week.  Hypokalemia, K 3.1 12/16/19, added Kcl, pending BMP   Hx of Afib/SSS s/p St Jude PPM 2010, ventricular tachycardia, takes Amiodarone Depression, takes Duloxetine 71m85m CAD, CABG x4, stents,off ASA due to hematuria.takes Plavix, Statin.  CHF, takes Torsemide, EF 35-40%, OA knee pain, left lower back, takes tylenol, Robaxin.  GERD takes Pantoprazole Hypothyroidism, takes Levothyroxine. TSH 1.67 11/25/19 Orthostatic hypotension, neurogenic,tales Droxidopa, Midodrine.  RLS, takes Ropinirole Constipation, takes Trulance, Senokot S, MiraLax prn Urinary retention, chronic indwelling cath, takes Oxybutynin.  Blood loss anemia, s/p 3 units PRBC transfusion, Hgb 11.6 11/25/19       Past Medical History:  Diagnosis Date  . Arthritis    "minor, back and sometimes knees" (12/15/2012)  . Bradycardia    SSS s/p St Jude PPM 04/12/2008  . CAD (coronary artery disease) 12/30/2014   CABG (LIMA-LAD, SVG-RCA, SVG-OM in 1996).  07/2009 BMS to SVG-RCA. Cath in 04/2010 with patent stents   . Cardiomyopathy, ischemic 08/25/2012  . Chronic knee pain 12/03/2014  . Combined congestive systolic and diastolic heart failure (HCC)[place]/29/2016   Hx EF 41%. BNP 96.8 02/21/15 Torsemide 04/06/15 Na 142, K 4.6, Bun 16, creat 0.89 04/20/14 BNP 111.7, Na 142, K 4.6, Bun 16, creat 0.9   . Depression with anxiety 02/02/2015   02/21/15 Hgb A1c 5.8 03/10/15 MMSE 30/30   .  Dizziness, after diuretic asscoiated with hypotension and responded to fluid bolus 06/05/2011   04/28/15 US carotid R+L normal  bilateral arterial velocities.    Marland Kitchen Dyspnea 08/11/2014   Followed in Pulmonary clinic/ [facility]/ [place]  - 08/11/2014  Walked RA x 1 laps @ 185 ft each stopped due to fatigue/off balance/ slow pace/  no sob or desat  - PFT's  09/26/2014  FEV1 2.26 (85 % ) ratio 76  p no % improvement from saba with DLCO  67 % corrects to 93 % for alv volume      Since prev study 08/04/13 minimal change lung vol or dlco    . Embolic cerebral infarction ([place]) 12/06/2015  . GERD (gastroesophageal reflux disease)   . Gout 02/09/2015  . Hiatal hernia   . Hyperlipidemia   . Hypertension   . Hypothyroid   . Influenza A 03/10/2016  . Insomnia   . Melanoma of back ([place]) 1976  . Myocardial infarction [facility]) 1996; 2011   "both silent" (12/15/2012)  . Nonrheumatic aortic valve stenosis   . Orthostatic hypotension   . Osteoporosis, senile   . Pacemaker   . RBBB   . Restless leg 02/02/2015  . Right leg weakness 12/06/2015  . [facility] spotted fever   . S/P CABG x 4   . Sick sinus syndrome ([place]) 01/31/2014  . Sustained ventricular tachycardia ([place]) 07/27/2014  . Urinary retention 12/30/2014   has a cathrater   Past Surgical History:  Procedure Laterality Date  . CARDIAC CATHETERIZATION  04/2010   LIMA to LAD patent,SVG to OM patent,no in-stnet restenosis RCA  . CATARACT EXTRACTION W/ INTRAOCULAR LENS  IMPLANT, BILATERAL Bilateral 2012  . CORONARY ANGIOPLASTY WITH STENT PLACEMENT  07/2009   bare metal stent to SVG to the RCA  . CORONARY ARTERY BYPASS GRAFT  1996   LIMA to LAD,SVG to RCA & SVG to OM  . INSERT / REPLACE / REMOVE PACEMAKER  2010  . MELANOMA EXCISION  05/1974 X2   "taken off my back" (12/15/2012)  . NM MYOVIEW LTD  06/2011   low risk  . PPM GENERATOR CHANGEOUT N/A 01/22/2017   Procedure: PPM GENERATOR CHANGEOUT;  Surgeon: [name], MD;  Location: [place] [facility];  Service: Cardiovascular;  Laterality: N/A;  . TONSILLECTOMY  1938  . TRANSURETHRAL RESECTION OF PROSTATE  1986  . US  ECHOCARDIOGRAPHY  07/11/2009   EF 45-50%    Allergies  Allergen Reactions  . Altace [Ramipril] Cough  . Crestor [Rosuvastatin Calcium] Rash  . Penicillins Rash and Other (See Comments)    Has patient had a PCN reaction causing immediate rash, facial/tongue/throat swelling, SOB or lightheadedness with hypotension: Yes Has patient had a PCN reaction causing severe rash involving mucus membranes or skin necrosis: No Has patient had a PCN reaction that required hospitalization No Has patient had a PCN reaction occurring within the last 10 years: No If all of the above answers are "NO", then may proceed with Cephalosporin use.   . Sulfa Antibiotics Rash  . Sulfamethoxazole-Trimethoprim Rash    Allergies as of 12/20/2019      Reactions   Altace [ramipril] Cough   Crestor [rosuvastatin Calcium] Rash   Penicillins Rash, Other (See Comments)   Has patient had a PCN reaction causing immediate rash, facial/tongue/throat swelling, SOB or lightheadedness with hypotension: Yes Has patient had a PCN reaction causing severe rash involving mucus membranes or skin necrosis: No Has patient had a PCN reaction that required hospitalization No  Has patient had a PCN reaction occurring within the last 10 years: No If all of the above answers are "NO", then may proceed with Cephalosporin use.   Sulfa Antibiotics Rash   Sulfamethoxazole-trimethoprim Rash      Medication List       Accurate as of December 20, 2019 11:59 PM. If you have any questions, ask your nurse or doctor.        acetaminophen 500 MG tablet Commonly known as: TYLENOL Take 500 mg by mouth 3 (three) times daily.   amiodarone 100 MG tablet Commonly known as: PACERONE Take 100 mg by mouth daily.   Anusol-HC 2.5 % rectal cream Generic drug: hydrocortisone Place 1 application rectally 2 (two) times daily.   clopidogrel 75 MG tablet Commonly known as: PLAVIX Take 75 mg by mouth daily.   clotrimazole-betamethasone  cream Commonly known as: LOTRISONE Apply 1 application topically daily as needed.   doxycycline 100 MG tablet Commonly known as: ADOXA Take 100 mg by mouth 2 (two) times daily.   Droxidopa 100 MG Caps Take 1 capsule (100 mg total) by mouth in the morning, at noon, and at bedtime.   DULoxetine 20 MG capsule Commonly known as: CYMBALTA Take 20 mg by mouth daily. For anxiety,insomnia and back pain   feeding supplement (PRO-STAT SUGAR FREE 64) Liqd Take 30 mLs by mouth daily.   fluticasone 50 MCG/ACT nasal spray Commonly known as: FLONASE Place 1 spray into both nostrils 2 (two) times daily.   guaiFENesin 100 MG/5ML Soln Commonly known as: ROBITUSSIN Take 5 mLs (100 mg total) by mouth every 4 (four) hours as needed for cough or to loosen phlegm.   Hydrocortisone-Aloe 1 % Crea Apply topically 2 (two) times daily as needed.   ketoconazole 2 % cream Commonly known as: NIZORAL Apply 1 application topically as needed for irritation. Apply small amount behind ears and in creases beside nose BID x 7 days. MAY REPEAT PRN. (Family supplied).   ketoconazole 2 % shampoo Commonly known as: NIZORAL Apply 1 application topically 2 (two) times a week.   lactose free nutrition Liqd Take 237 mLs by mouth 2 (two) times daily between meals.   levothyroxine 100 MCG tablet Commonly known as: SYNTHROID Take 100 mcg by mouth daily before breakfast.   meclizine 25 MG tablet Commonly known as: ANTIVERT Take 1 tablet (25 mg total) by mouth 2 (two) times daily as needed for dizziness.   melatonin 3 MG Tabs tablet Take 3 mg by mouth at bedtime.   methocarbamol 500 MG tablet Commonly known as: ROBAXIN Take 250 mg by mouth 2 (two) times daily as needed for muscle spasms.   midodrine 5 MG tablet Commonly known as: PROAMATINE Take 5 mg by mouth 3 (three) times daily with meals. Notify provider if BP is < 90/60.   mineral oil-hydrophilic petrolatum ointment Apply 1 application topically 2  (two) times daily as needed for dry skin. To face   oxybutynin 10 MG 24 hr tablet Commonly known as: DITROPAN-XL Take 1 tablet (10 mg total) by mouth at bedtime.   pantoprazole 40 MG tablet Commonly known as: PROTONIX Take 40 mg by mouth daily.   Plecanatide 3 MG Tabs Commonly known as: Trulance Take 3 mg by mouth daily.   polyethylene glycol 17 g packet Commonly known as: MIRALAX / GLYCOLAX Take 17 g by mouth daily as needed for mild constipation. Take 1-2 capfuls in 8 oz of H2O   potassium chloride SA 20 MEQ tablet Commonly known  as: KLOR-CON Take 20 mEq by mouth daily.   pravastatin 20 MG tablet Commonly known as: PRAVACHOL Take 20 mg by mouth daily.   rOPINIRole 0.25 MG tablet Commonly known as: REQUIP Take 0.5 mg by mouth 2 (two) times daily.   rOPINIRole 0.25 MG tablet Commonly known as: REQUIP Take 0.25 mg by mouth daily as needed.   saccharomyces boulardii 250 MG capsule Commonly known as: FLORASTOR Take 250 mg by mouth 2 (two) times daily.   sennosides-docusate sodium 8.6-50 MG tablet Commonly known as: SENOKOT-S Take 2 tablets by mouth 2 (two) times daily.   torsemide 10 MG tablet Commonly known as: DEMADEX Take 20 mg by mouth daily. 20 mg once a day   Vitamin D3 125 MCG (5000 UT) Caps Take 5,000 Units by mouth every Monday.       Review of Systems  Constitutional: Positive for activity change, appetite change and fatigue. Negative for chills, diaphoresis and fever.       Weight loss #4Ibs in the past month  HENT: Positive for hearing loss. Negative for congestion and voice change.   Eyes: Negative for visual disturbance.  Respiratory: Positive for cough and shortness of breath. Negative for chest tightness and wheezing.        Chronic DOE, cough  Cardiovascular: Negative for chest pain, palpitations and leg swelling.  Gastrointestinal: Negative for abdominal pain and constipation.  Genitourinary: Positive for difficulty urinating. Negative for  dysuria and hematuria.       Foley  Musculoskeletal: Positive for arthralgias, back pain and gait problem.       Right shoulder limited over head ROM with pain.  Positional lower back pain is chronic, chronic knee pain  Skin: Negative for color change.  Neurological: Negative for dizziness, speech difficulty, weakness and light-headedness.       Memory lapses. Mild expressive aphasia.   Psychiatric/Behavioral: Negative for behavioral problems and sleep disturbance. The patient is not nervous/anxious.     Immunization History  Administered Date(s) Administered  . Influenza, High Dose Seasonal PF 11/01/2014, 11/13/2016, 11/19/2018, 11/19/2018  . Influenza,inj,Quad PF,6+ Mos 11/24/2017  . Influenza,inj,quad, With Preservative 11/17/2017  . Influenza-Unspecified 12/05/2013, 11/05/2014, 11/16/2015, 11/09/2019  . Moderna SARS-COVID-2 Vaccination 02/08/2019, 03/08/2019  . PPD Test 03/07/2014  . Pneumococcal Conjugate-13 10/05/2009  . Pneumococcal Polysaccharide-23 11/05/2005  . Pneumococcal-Unspecified 10/05/2009  . Tdap 08/02/2014  . Zoster 11/05/2005   Pertinent  Health Maintenance Due  Topic Date Due  . DEXA SCAN  12/14/2024  . INFLUENZA VACCINE  Completed  . PNA vac Low Risk Adult  Completed   Fall Risk  10/09/2017 10/02/2016 10/30/2015 09/05/2015 02/02/2015  Falls in the past year? No No No No No  Risk for fall due to : - - Impaired balance/gait - -   Functional Status Survey:    Vitals:   12/20/19 1104  BP: 107/62  Pulse: 71  Resp: 20  Temp: (!) 97.2 F (36.2 C)  SpO2: 95%  Weight: 161 lb 1.6 oz (73.1 kg)  Height: '5\' 10"'  (1.778 m)   Body mass index is 23.12 kg/m. Physical Exam Vitals and nursing note reviewed.  Constitutional:      Comments: Appears weak  HENT:     Head: Normocephalic and atraumatic.     Nose: Nose normal.     Mouth/Throat:     Mouth: Mucous membranes are moist.  Eyes:     Extraocular Movements: Extraocular movements intact.      Conjunctiva/sclera: Conjunctivae normal.     Pupils: Pupils  are equal, round, and reactive to light.  Cardiovascular:     Rate and Rhythm: Normal rate and regular rhythm.     Heart sounds: Murmur heard.      Comments: Left upper chest pace maker.  Pulmonary:     Breath sounds: Rales present.     Comments: Bibasilar rales.  Abdominal:     General: Bowel sounds are normal.     Palpations: Abdomen is soft.     Tenderness: There is no abdominal tenderness.  Genitourinary:    Comments: Foley, dark blood urine in Foley catheter bag Musculoskeletal:     Cervical back: Normal range of motion and neck supple.     Right lower leg: No edema.     Left lower leg: No edema.     Comments: Reduced over head ROM of the right shoulder.  Positional lower back pain remains no change.   Skin:    General: Skin is warm and dry.     Findings: No bruising.  Neurological:     General: No focal deficit present.     Mental Status: He is alert. Mental status is at baseline.     Gait: Gait abnormal.     Comments: Oriented to person, place.   Psychiatric:        Mood and Affect: Mood normal.        Behavior: Behavior normal.        Thought Content: Thought content normal.     Labs reviewed: Recent Labs    08/29/19 0233 08/29/19 0233 08/30/19 0253 08/30/19 0253 08/31/19 0205 08/31/19 0205 09/01/19 0226 09/01/19 0226 09/02/19 0514 09/03/19 0545 11/29/19 0000 12/16/19 0000  NA 137   < > 135   < > 137   < > 138   < > 138  --  139 141  K 3.7   < > 3.1*   < > 3.6   < > 3.8   < > 3.5  --  3.6 3.1*  CL 102   < > 99   < > 102   < > 99   < > 95*  --  100 100  CO2 24   < > 27   < > 25   < > 26   < > 30  --  31* 30*  GLUCOSE 98   < > 104*   < > 114*  --  101*  --  112*  --   --   --   BUN 16   < > 15   < > 13   < > 14   < > 17  --  20 17  CREATININE 0.77   < > 0.72   < > 0.65   < > 0.65   < > 0.88  --  0.9 0.9  CALCIUM 8.1*   < > 7.7*   < > 8.4*   < > 8.4*   < > 8.5*  --  8.9 8.3*  MG 2.7*  --   --    --  2.3  --   --   --   --  2.5*  --   --   PHOS 2.1*   < > 3.1  --  3.0  --   --   --   --  3.7  --   --    < > = values in this interval not displayed.   Recent Labs    08/27/19 0226 08/29/19 0233 11/29/19 0000  AST 20 33 16  ALT '12 18 11  ' ALKPHOS 53 55 78  BILITOT 0.8 0.7  --   PROT 5.7* 6.1*  --   ALBUMIN 2.7* 2.6* 3.4*   Recent Labs    08/26/19 0111 08/26/19 0127 08/31/19 0205 08/31/19 0205 09/01/19 0226 09/03/19 0545 11/29/19 0000  WBC 17.2*   < > 8.9   < > 8.0 7.8 6.9  NEUTROABS 13.2*  --   --   --   --   --   --   HGB 12.4*   < > 10.3*   < > 10.2* 11.1* 11.6*  HCT 37.2*   < > 32.1*   < > 31.7* 34.1* 35*  MCV 99.2   < > 98.5  --  97.5 96.1  --   PLT 303   < > 218   < > 250 322 232   < > = values in this interval not displayed.   Lab Results  Component Value Date   TSH 0.60 12/16/2019   Lab Results  Component Value Date   HGBA1C 5.8 (H) 08/26/2019   Lab Results  Component Value Date   CHOL 84 10/18/2019   HDL 32 (A) 10/18/2019   LDLCALC 31 10/18/2019   TRIG 133 10/18/2019   CHOLHDL 2.6 05/05/2017    Significant Diagnostic Results in last 30 days:  No results found.  Assessment/Plan Acute bronchitis Acute bronchitis vs PNA vs CHF vs GERD? non productive cough x 10 days, O2 at 2lpm to keep Sat O2>90%, c/o  weak, fatigue, decreased appetite. Pending CXR, CBC, BMP. Negative COVID. Doxycycline 15m bid x 7 days started 12/19/19 given clinical symptoms. The patient is afebrile today. CXR 2 weeks ago showed vascular congestion, Torsemide was increased to 279mqam, added 1075mn pm x 1week.  12/20/19 CXR mil stable cardiomegaly w/o overt CHF, no acute parenchymal pathology, no significant interval change compared to 01/01/19 12/20/19 wbc 8.0, Hgb 11.1, plt 331, Na 139, K 3.5, Bun 14, creat 0.80, eGFR 79   Hypokalemia Hypokalemia, K 3.1 12/16/19, added Kcl, pending BMP   Ventricular tachycardia ([place]CCShakopeex of Afib/SSS s/p St Jude PPM 2010, ventricular  tachycardia, takes Amiodarone  Depression, recurrent (HCC) Depression, takes Duloxetine 87m24m   Coronary artery disease involving native coronary artery of native heart without angina pectoris CAD, CABG x4, stents,off ASA due to hematuria.takes Plavix, Statin.   Combined congestive systolic and diastolic heart failure (HCC) CHF, takes Torsemide, EF 35-40%,  Osteoarthritis OA knee pain, left lower back, takes tylenol, Robaxin.   GERD (gastroesophageal reflux disease) GERD takes Pantoprazole  Hypothyroidism Hypothyroidism, takes Levothyroxine. TSH 1.67 11/25/19   Neurogenic orthostatic hypotension (HCC) Orthostatic hypotension, neurogenic,tales Droxidopa, Midodrine.   RLS (restless legs syndrome) RLS, takes Ropinirole   Chronic constipation Constipation, takes Trulance, Senokot S, MiraLax prn   Urinary retention Urinary retention, chronic indwelling cath, takes Oxybutynin  Blood loss anemia Blood loss anemia, s/p 3 units PRBC transfusion, Hgb 11.6 11/25/19      Family/ staff Communication: plan of care reviewed with the patient and charge nurse.   Labs/tests ordered:  Pending CBC, CMP, CXR  Time spend 35 minutes.

## 2019-12-20 NOTE — Assessment & Plan Note (Signed)
Blood loss anemia, s/p 3 units PRBC transfusion, Hgb 11.6 11/25/19

## 2019-12-20 NOTE — Assessment & Plan Note (Signed)
Depression, takes Duloxetine 20mg  qd

## 2019-12-20 NOTE — Assessment & Plan Note (Signed)
CHF, takes Torsemide, EF 35-40%,

## 2019-12-20 NOTE — Assessment & Plan Note (Signed)
OA knee pain, left lower back, takes tylenol, Robaxin.

## 2019-12-20 NOTE — Assessment & Plan Note (Signed)
Urinary retention, chronic indwelling cath, takes Oxybutynin

## 2019-12-20 NOTE — Assessment & Plan Note (Signed)
Constipation, takes Trulance, Senokot S, MiraLax prn

## 2019-12-21 ENCOUNTER — Encounter: Payer: Self-pay | Admitting: Nurse Practitioner

## 2019-12-23 LAB — COMPREHENSIVE METABOLIC PANEL
Calcium: 8.8 (ref 8.7–10.7)
GFR calc Af Amer: 80
GFR calc non Af Amer: 69

## 2019-12-23 LAB — BASIC METABOLIC PANEL
BUN: 20 (ref 4–21)
CO2: 32 — AB (ref 13–22)
Chloride: 100 (ref 99–108)
Creatinine: 1 (ref 0.6–1.3)
Glucose: 89
Potassium: 4.6 (ref 3.4–5.3)
Sodium: 138 (ref 137–147)

## 2019-12-24 ENCOUNTER — Encounter: Payer: Self-pay | Admitting: Nurse Practitioner

## 2019-12-24 ENCOUNTER — Non-Acute Institutional Stay (SKILLED_NURSING_FACILITY): Payer: Medicare PPO | Admitting: Nurse Practitioner

## 2019-12-24 DIAGNOSIS — I5040 Unspecified combined systolic (congestive) and diastolic (congestive) heart failure: Secondary | ICD-10-CM

## 2019-12-24 DIAGNOSIS — Z950 Presence of cardiac pacemaker: Secondary | ICD-10-CM | POA: Diagnosis not present

## 2019-12-24 DIAGNOSIS — G2581 Restless legs syndrome: Secondary | ICD-10-CM

## 2019-12-24 DIAGNOSIS — K5909 Other constipation: Secondary | ICD-10-CM

## 2019-12-24 DIAGNOSIS — F339 Major depressive disorder, recurrent, unspecified: Secondary | ICD-10-CM | POA: Diagnosis not present

## 2019-12-24 DIAGNOSIS — E876 Hypokalemia: Secondary | ICD-10-CM | POA: Diagnosis not present

## 2019-12-24 DIAGNOSIS — D500 Iron deficiency anemia secondary to blood loss (chronic): Secondary | ICD-10-CM

## 2019-12-24 DIAGNOSIS — E032 Hypothyroidism due to medicaments and other exogenous substances: Secondary | ICD-10-CM

## 2019-12-24 DIAGNOSIS — I255 Ischemic cardiomyopathy: Secondary | ICD-10-CM

## 2019-12-24 DIAGNOSIS — R339 Retention of urine, unspecified: Secondary | ICD-10-CM

## 2019-12-24 DIAGNOSIS — I951 Orthostatic hypotension: Secondary | ICD-10-CM

## 2019-12-24 DIAGNOSIS — J209 Acute bronchitis, unspecified: Secondary | ICD-10-CM

## 2019-12-24 DIAGNOSIS — M159 Polyosteoarthritis, unspecified: Secondary | ICD-10-CM

## 2019-12-24 DIAGNOSIS — K219 Gastro-esophageal reflux disease without esophagitis: Secondary | ICD-10-CM

## 2019-12-24 NOTE — Assessment & Plan Note (Signed)
CHF, takes Torsemide, EF 35-40%,

## 2019-12-24 NOTE — Assessment & Plan Note (Signed)
Hypothyroidism, takes Levothyroxine.TSH 1.67 11/25/19

## 2019-12-24 NOTE — Assessment & Plan Note (Signed)
OA knee pain, left lower back, takes tylenol, Robaxin.

## 2019-12-24 NOTE — Assessment & Plan Note (Signed)
Depression, takes Duloxetine 20mg  qd

## 2019-12-24 NOTE — Assessment & Plan Note (Signed)
CAD, CABG x4, stents,off ASA due to hematuria.takes Plavix, Statin.

## 2019-12-24 NOTE — Assessment & Plan Note (Signed)
Hx of Afib/SSS s/p St Jude PPM 2010, ventricular tachycardia, takes Amiodarone

## 2019-12-24 NOTE — Assessment & Plan Note (Signed)
GERD takes Pantoprazole °

## 2019-12-24 NOTE — Assessment & Plan Note (Addendum)
Constipation, takes Trulance, Senokot S, MiraLaxprn

## 2019-12-24 NOTE — Assessment & Plan Note (Signed)
Acute bronchitis, improved,  treated with total Doxycycline 100mg bid x 7 days started 12/19/19.12/20/19 CXR stable cardiomegaly w/o overt CHF, no acute parenchymal pathology, no significant interval change compared to 01/01/19 12/20/19 wbc 8.0, Hgb 11.1, plt 331, Na 139, K 3.5, Bun 14, creat 0.80, eGFR 79 CXR 2 weeks ago showed vascular congestion, Torsemide was increased to 20mg qam, added 10mg in pm x 1week. 

## 2019-12-24 NOTE — Assessment & Plan Note (Signed)
Blood loss anemia, s/p 3 units PRBC transfusion, Hgb 11.6 11/25/19

## 2019-12-24 NOTE — Assessment & Plan Note (Signed)
Urinary retention, chronic indwelling cath, takes Oxybutynin.

## 2019-12-24 NOTE — Assessment & Plan Note (Signed)
RLS, takes Ropinirole

## 2019-12-24 NOTE — Assessment & Plan Note (Signed)
Hypokalemia, K 3.1 12/16/19, added Kcl, K 3.5 12/20/19

## 2019-12-24 NOTE — Progress Notes (Signed)
Location:  [facility] Room Number: 29-A Place of Service:  SNF (31) Provider:  Dalayna Lauter Darlina Rumpf, NP   Patient Care Team: [name], MD as PCP - General (Internal Medicine) [name], MD as PCP - Cardiology (Cardiology) [name], MD as Attending Physician (Cardiology) Vevelyn Royals, MD as Consulting Physician (Ophthalmology) Franchot Gallo, MD as Consulting Physician (Urology) Tanda Rockers, MD as Consulting Physician (Pulmonary Disease) Allyn Kenner, MD (Dermatology) Deliah Goody, PA-C as Physician Assistant (Physician Assistant) Judieth Mckown X, NP as Nurse Practitioner (Internal Medicine) Ngetich, Nelda Bucks, NP as Nurse Practitioner (Family Medicine)  Extended Emergency Contact Information Primary Emergency Contact: [name] of [place] Phone: [phone] Relation: Daughter Secondary Emergency Contact: Dwiggins,[name] Address: [address]          [place] Phone: [phone] Mobile Phone: [phone] Relation: Spouse  Code Status:  DNR Goals of care: Advanced Directive information Advanced Directives 12/24/2019  Does Patient Have a Medical Advance Directive? Yes  Type of Paramedic of [place];Out of facility DNR (pink MOST or yellow form)  Does patient want to make changes to medical advance directive? No - Patient declined  Copy of [place] in Chart? Yes - validated most recent copy scanned in chart (See row information)  Would patient like information on creating a medical advance directive? -  Pre-existing out of facility DNR order (yellow form or pink MOST form) Yellow form placed in chart (order not valid for inpatient use);Pink MOST form placed in chart (order not valid for inpatient use)     Chief Complaint  Patient presents with  . Medical Management of Chronic Issues    Routine [facility] visit     HPI:  Pt is a 86 y.o. male seen today for medical management of chronic diseases.    Acute bronchitis, treated with total Doxycycline 128m bid x 7 days started 12/19/19.12/20/19 CXR stable cardiomegaly w/o overt CHF, no acute parenchymal pathology, no significant interval change compared to 01/01/19 12/20/19 wbc 8.0, Hgb 11.1, plt 331, Na 139, K 3.5, Bun 14, creat 0.80, eGFR 79 CXR 2 weeks ago showed vascular congestion, Torsemide was increased to 212mqam, added 1063mn pm x 1week.             Hypokalemia, K 3.1 12/16/19, added Kcl, K 3.5 12/20/19             Hx of Afib/SSS s/p St Jude PPM 2010, ventricular tachycardia, takes Amiodarone Depression, takes Duloxetine 75m51m CAD, CABG x4, stents,off ASA due to hematuria.takes Plavix, Statin. CHF, takes Torsemide, EF 35-40%, OA knee pain, left lower back, takes tylenol, Robaxin.  GERD takes Pantoprazole Hypothyroidism, takes Levothyroxine.TSH 1.67 11/25/19 Orthostatic hypotension, neurogenic,tales Droxidopa, Midodrine.  RLS, takes Ropinirole Constipation, takes Trulance, Senokot S, MiraLaxprn Urinary retention, chronic indwelling cath, takes Oxybutynin.  Blood loss anemia, s/p 3 units PRBC transfusion, Hgb 11.6 11/25/19    Past Medical History:  Diagnosis Date  . Arthritis    "minor, back and sometimes knees" (12/15/2012)  . Bradycardia    SSS s/p St Jude PPM 04/12/2008  . CAD (coronary artery disease) 12/30/2014   CABG (LIMA-LAD, SVG-RCA, SVG-OM in 1996).  07/2009 BMS to SVG-RCA. Cath in 04/2010 with patent stents   . Cardiomyopathy, ischemic 08/25/2012  . Chronic knee pain 12/03/2014  . Combined congestive systolic and diastolic heart failure (HCC)[place]/29/2016   Hx EF 41%. BNP  96.8 02/21/15 Torsemide 04/06/15 Na 142, K 4.6, Bun 16, creat 0.89 04/20/14 BNP 111.7, Na 142, K 4.6, Bun 16, creat 0.9   .  Depression with anxiety 02/02/2015   02/21/15 Hgb A1c 5.8 03/10/15 MMSE 30/30   . Dizziness, after diuretic asscoiated with hypotension and responded to fluid bolus 06/05/2011   04/28/15 US carotid R+L normal bilateral arterial velocities.    Marland Kitchen Dyspnea 08/11/2014   Followed in Pulmonary clinic/ [facility]/ [place]  - 08/11/2014  Walked RA x 1 laps @ 185 ft each stopped due to fatigue/off balance/ slow pace/  no sob or desat  - PFT's  09/26/2014  FEV1 2.26 (85 % ) ratio 76  p no % improvement from saba with DLCO  67 % corrects to 93 % for alv volume      Since prev study 08/04/13 minimal change lung vol or dlco    . Embolic cerebral infarction ([place]) 12/06/2015  . GERD (gastroesophageal reflux disease)   . Gout 02/09/2015  . Hiatal hernia   . Hyperlipidemia   . Hypertension   . Hypothyroid   . Influenza A 03/10/2016  . Insomnia   . Melanoma of back ([place]) 1976  . Myocardial infarction [facility]) 1996; 2011   "both silent" (12/15/2012)  . Nonrheumatic aortic valve stenosis   . Orthostatic hypotension   . Osteoporosis, senile   . Pacemaker   . RBBB   . Restless leg 02/02/2015  . Right leg weakness 12/06/2015  . [facility] spotted fever   . S/P CABG x 4   . Sick sinus syndrome ([place]) 01/31/2014  . Sustained ventricular tachycardia ([place]) 07/27/2014  . Urinary retention 12/30/2014   has a cathrater   Past Surgical History:  Procedure Laterality Date  . CARDIAC CATHETERIZATION  04/2010   LIMA to LAD patent,SVG to OM patent,no in-stnet restenosis RCA  . CATARACT EXTRACTION W/ INTRAOCULAR LENS  IMPLANT, BILATERAL Bilateral 2012  . CORONARY ANGIOPLASTY WITH STENT PLACEMENT  07/2009   bare metal stent to SVG to the RCA  . CORONARY ARTERY BYPASS GRAFT  1996   LIMA to LAD,SVG to RCA & SVG to OM  . INSERT / REPLACE / REMOVE PACEMAKER  2010  . MELANOMA EXCISION  05/1974 X2   "taken off my back" (12/15/2012)  . NM MYOVIEW LTD  06/2011   low risk  . PPM GENERATOR CHANGEOUT N/A 01/22/2017   Procedure: PPM  GENERATOR CHANGEOUT;  Surgeon: [name], MD;  Location: [place] CV LAB;  Service: Cardiovascular;  Laterality: N/A;  . TONSILLECTOMY  1938  . TRANSURETHRAL RESECTION OF PROSTATE  1986  . US ECHOCARDIOGRAPHY  07/11/2009   EF 45-50%    Allergies  Allergen Reactions  . Altace [Ramipril] Cough  . Crestor [Rosuvastatin Calcium] Rash  . Penicillins Rash and Other (See Comments)    Has patient had a PCN reaction causing immediate rash, facial/tongue/throat swelling, SOB or lightheadedness with hypotension: Yes Has patient had a PCN reaction causing severe rash involving mucus membranes or skin necrosis: No Has patient had a PCN reaction that required hospitalization No Has patient had a PCN reaction occurring within the last 10 years: No If all of the above answers are "NO", then may proceed with Cephalosporin use.   . Sulfa Antibiotics Rash  . Sulfamethoxazole-Trimethoprim Rash    Outpatient Encounter Medications as of 12/24/2019  Medication Sig  . acetaminophen (TYLENOL) 500 MG tablet Take 500 mg by mouth 3 (three) times daily.   . Amino Acids-Protein Hydrolys (  FEEDING SUPPLEMENT, PRO-STAT SUGAR FREE 64,) LIQD Take 30 mLs by mouth daily.   Marland Kitchen amiodarone (PACERONE) 100 MG tablet Take 100 mg by mouth daily.   . Cholecalciferol (VITAMIN D3) 5000 UNITS CAPS Take 5,000 Units by mouth every Monday.   . clobetasol (TEMOVATE) 0.05 % external solution Apply 1 application topically 2 (two) times daily as needed. Not to apply on face, groin, or underarms  . clopidogrel (PLAVIX) 75 MG tablet Take 75 mg by mouth daily.  . clotrimazole-betamethasone (LOTRISONE) cream Apply 1 application topically daily as needed. Apply to pen and scrotum  . [EXPIRED] doxycycline (ADOXA) 100 MG tablet Take 100 mg by mouth 2 (two) times daily.  . Droxidopa 100 MG CAPS Take 1 capsule (100 mg total) by mouth in the morning, at noon, and at bedtime.  . DULoxetine (CYMBALTA) 20 MG capsule Take 20 mg by mouth daily.  For anxiety,insomnia and back pain  . fluticasone (CUTIVATE) 0.05 % cream Apply 1 application topically 2 (two) times daily.  . fluticasone (FLONASE) 50 MCG/ACT nasal spray Place 1 spray into both nostrils 2 (two) times daily.  Marland Kitchen guaiFENesin (ROBITUSSIN) 100 MG/5ML SOLN Take 5 mLs (100 mg total) by mouth every 4 (four) hours as needed for cough or to loosen phlegm.  . hydrocortisone (ANUSOL-HC) 2.5 % rectal cream Place 1 application rectally 2 (two) times daily.  . Hydrocortisone-Aloe 1 % CREA Apply topically 2 (two) times daily as needed.  Marland Kitchen ketoconazole (NIZORAL) 2 % cream Apply 1 application topically as needed for irritation. Apply small amount behind ears and in creases beside nose  . ketoconazole (NIZORAL) 2 % shampoo Apply 1 application topically 2 (two) times a week.  . lactose free nutrition (BOOST PLUS) LIQD Take 237 mLs by mouth 2 (two) times daily between meals.  Marland Kitchen levothyroxine (SYNTHROID, LEVOTHROID) 100 MCG tablet Take 100 mcg by mouth daily before breakfast.  . meclizine (ANTIVERT) 25 MG tablet Take 1 tablet (25 mg total) by mouth 2 (two) times daily as needed for dizziness.  . Melatonin 3 MG TABS Take 3 mg by mouth at bedtime.  . methocarbamol (ROBAXIN) 500 MG tablet Take 250 mg by mouth 2 (two) times daily as needed for muscle spasms.   . midodrine (PROAMATINE) 5 MG tablet Take 5 mg by mouth 3 (three) times daily with meals. Notify provider if BP is < 90/60.  Marland Kitchen mineral oil-hydrophilic petrolatum (AQUAPHOR) ointment Apply 1 application topically 2 (two) times daily as needed for dry skin. To face  . Nutritional Supplements (NUTRITIONAL SUPPLEMENT PO) Take 1 each by mouth daily. Magic Cup with lunch  . oxybutynin (DITROPAN-XL) 10 MG 24 hr tablet Take 1 tablet (10 mg total) by mouth at bedtime.  . pantoprazole (PROTONIX) 40 MG tablet Take 40 mg by mouth daily.  Marland Kitchen Plecanatide (TRULANCE) 3 MG TABS Take 3 mg by mouth daily.  . polyethylene glycol (MIRALAX / GLYCOLAX) 17 g packet Take  17 g by mouth daily as needed for mild constipation. Take 1-2 capfuls in 8 oz of H2O   . potassium chloride SA (KLOR-CON) 20 MEQ tablet Take 20 mEq by mouth daily.  . pravastatin (PRAVACHOL) 20 MG tablet Take 20 mg by mouth daily.  Marland Kitchen rOPINIRole (REQUIP) 0.25 MG tablet Take 0.5 mg by mouth 2 (two) times daily.  Marland Kitchen rOPINIRole (REQUIP) 0.25 MG tablet Take 0.25 mg by mouth daily as needed.  . [EXPIRED] saccharomyces boulardii (FLORASTOR) 250 MG capsule Take 250 mg by mouth 2 (two) times daily.  Marland Kitchen  sennosides-docusate sodium (SENOKOT-S) 8.6-50 MG tablet Take 2 tablets by mouth 2 (two) times daily.   Marland Kitchen torsemide (DEMADEX) 10 MG tablet Take 20 mg by mouth daily. 20 mg once a day   No facility-administered encounter medications on file as of 12/24/2019.    Review of Systems  Constitutional: Positive for activity change, appetite change, fatigue and unexpected weight change. Negative for fever.       Weight loss #7 Ibs in the past month.   HENT: Positive for hearing loss. Negative for congestion and voice change.   Eyes: Negative for visual disturbance.  Respiratory: Positive for cough. Negative for chest tightness, shortness of breath and wheezing.        Chronic DOE, cough  Cardiovascular: Negative for leg swelling.  Gastrointestinal: Negative for abdominal pain and constipation.  Genitourinary: Positive for difficulty urinating. Negative for dysuria and hematuria.       Foley  Musculoskeletal: Positive for arthralgias, back pain and gait problem.       Right shoulder limited over head ROM with pain.  Positional lower back pain is chronic, chronic knee pain  Skin: Negative for color change.  Neurological: Negative for dizziness, speech difficulty, weakness and light-headedness.       Memory lapses. Mild expressive aphasia.   Psychiatric/Behavioral: Negative for behavioral problems and sleep disturbance. The patient is not nervous/anxious.     Immunization History  Administered Date(s)  Administered  . Influenza, High Dose Seasonal PF 11/01/2014, 11/13/2016, 11/19/2018, 11/19/2018  . Influenza,inj,Quad PF,6+ Mos 11/24/2017  . Influenza,inj,quad, With Preservative 11/17/2017  . Influenza-Unspecified 12/05/2013, 11/05/2014, 11/16/2015, 11/09/2019  . Moderna SARS-COVID-2 Vaccination 02/08/2019, 03/08/2019  . PPD Test 03/07/2014  . Pneumococcal Conjugate-13 10/05/2009  . Pneumococcal Polysaccharide-23 11/05/2005  . Pneumococcal-Unspecified 10/05/2009  . Tdap 08/02/2014  . Zoster 11/05/2005   Pertinent  Health Maintenance Due  Topic Date Due  . DEXA SCAN  12/14/2024  . INFLUENZA VACCINE  Completed  . PNA vac Low Risk Adult  Completed   Fall Risk  10/09/2017 10/02/2016 10/30/2015 09/05/2015 02/02/2015  Falls in the past year? No No No No No  Risk for fall due to : - - Impaired balance/gait - -   Functional Status Survey:    Vitals:   12/24/19 0810  BP: 120/62  Pulse: 70  Temp: (!) 97.4 F (36.3 C)  TempSrc: Oral  SpO2: 96%  Weight: 161 lb 1.6 oz (73.1 kg)  Height: '5\' 10"'  (1.778 m)   Body mass index is 23.12 kg/m. Physical Exam Vitals and nursing note reviewed.  Constitutional:      Comments: Appears weak  HENT:     Head: Normocephalic and atraumatic.  Eyes:     Extraocular Movements: Extraocular movements intact.     Conjunctiva/sclera: Conjunctivae normal.     Pupils: Pupils are equal, round, and reactive to light.  Cardiovascular:     Rate and Rhythm: Normal rate and regular rhythm.     Heart sounds: Murmur heard.      Comments: Left upper chest pace maker.  Pulmonary:     Breath sounds: No rales.     Comments: Bibasilar rales.  Abdominal:     General: Bowel sounds are normal.     Palpations: Abdomen is soft.     Tenderness: There is no abdominal tenderness.  Genitourinary:    Comments: Foley, dark blood urine in Foley catheter bag Musculoskeletal:     Cervical back: Normal range of motion and neck supple.     Right lower leg:  No edema.      Left lower leg: No edema.     Comments: Reduced over head ROM of the right shoulder.  Positional lower back pain remains no change.   Skin:    General: Skin is warm and dry.     Findings: No bruising.  Neurological:     General: No focal deficit present.     Mental Status: He is alert. Mental status is at baseline.     Gait: Gait abnormal.     Comments: Oriented to person, place.   Psychiatric:        Mood and Affect: Mood normal.        Behavior: Behavior normal.        Thought Content: Thought content normal.     Labs reviewed: Recent Labs     0000 08/29/19 0233 08/29/19 0233 08/30/19 0253 08/30/19 0253 08/31/19 0205 08/31/19 0205 09/01/19 0226 09/01/19 0226 09/02/19 0514 09/02/19 0514 09/03/19 0545 11/29/19 0000 12/16/19 0000 12/20/19 0000  NA   < > 137   < > 135   < > 137   < > 138   < > 138  --   --  139 141 139  K  --  3.7   < > 3.1*   < > 3.6   < > 3.8   < > 3.5   < >  --  3.6 3.1* 3.5  CL  --  102   < > 99   < > 102   < > 99   < > 95*   < >  --  100 100 100  CO2  --  24   < > 27   < > 25   < > 26   < > 30   < >  --  31* 30* 31*  GLUCOSE  --  98   < > 104*   < > 114*  --  101*  --  112*  --   --   --   --   --   BUN   < > 16   < > 15   < > 13   < > 14   < > 17  --   --  '20 17 14  ' CREATININE   < > 0.77   < > 0.72   < > 0.65   < > 0.65   < > 0.88  --   --  0.9 0.9 0.8  CALCIUM  --  8.1*   < > 7.7*   < > 8.4*   < > 8.4*   < > 8.5*   < >  --  8.9 8.3* 8.4*  MG  --  2.7*  --   --   --  2.3  --   --   --   --   --  2.5*  --   --   --   PHOS  --  2.1*   < > 3.1  --  3.0  --   --   --   --   --  3.7  --   --   --    < > = values in this interval not displayed.   Recent Labs    08/27/19 0226 08/29/19 0233 11/29/19 0000  AST 20 33 16  ALT '12 18 11  ' ALKPHOS 53 55 78  BILITOT 0.8 0.7  --   PROT 5.7* 6.1*  --   ALBUMIN 2.7* 2.6*  3.4*   Recent Labs    08/26/19 0111 08/26/19 0127 08/31/19 0205 08/31/19 0205 09/01/19 0226 09/01/19 0226 09/03/19 0545 11/29/19  0000 12/20/19 0000  WBC 17.2*   < > 8.9   < > 8.0   < > 7.8 6.9 8.0  NEUTROABS 13.2*  --   --   --   --   --   --   --   --   HGB 12.4*   < > 10.3*   < > 10.2*   < > 11.1* 11.6* 11.1*  HCT 37.2*   < > 32.1*   < > 31.7*   < > 34.1* 35* 32*  MCV 99.2   < > 98.5  --  97.5  --  96.1  --   --   PLT 303   < > 218   < > 250   < > 322 232 331   < > = values in this interval not displayed.   Lab Results  Component Value Date   TSH 0.60 12/16/2019   Lab Results  Component Value Date   HGBA1C 5.8 (H) 08/26/2019   Lab Results  Component Value Date   CHOL 84 10/18/2019   HDL 32 (A) 10/18/2019   LDLCALC 31 10/18/2019   TRIG 133 10/18/2019   CHOLHDL 2.6 05/05/2017    Significant Diagnostic Results in last 30 days:  No results found.  Assessment/Plan Acute bronchitis Acute bronchitis, improved,  treated with total Doxycycline 154m bid x 7 days started 12/19/19.12/20/19 CXR stable cardiomegaly w/o overt CHF, no acute parenchymal pathology, no significant interval change compared to 01/01/19 12/20/19 wbc 8.0, Hgb 11.1, plt 331, Na 139, K 3.5, Bun 14, creat 0.80, eGFR 79 CXR 2 weeks ago showed vascular congestion, Torsemide was increased to 245mqam, added 1014mn pm x 1week.  Hypokalemia Hypokalemia, K 3.1 12/16/19, added Kcl, K 3.5 12/20/19   Pacemaker Hx of Afib/SSS s/p St Jude PPM 2010, ventricular tachycardia, takes Amiodarone   Depression, recurrent (HCC) Depression, takes Duloxetine 30m52m   Cardiomyopathy, ischemic CAD, CABG x4, stents,off ASA due to hematuria.takes Plavix, Statin.   Combined congestive systolic and diastolic heart failure (HCC) CHF, takes Torsemide, EF 35-40%,   Osteoarthritis OA knee pain, left lower back, takes tylenol, Robaxin.    GERD (gastroesophageal reflux disease) GERD takes Pantoprazole   Hypothyroidism Hypothyroidism, takes Levothyroxine.TSH 1.67 11/25/19   Orthostatic hypotension Orthostatic hypotension, neurogenic,tales  Droxidopa, Midodrine.  RLS (restless legs syndrome) RLS, takes Ropinirole  Chronic constipation Constipation, takes Trulance, Senokot S, MiraLaxprn   Urinary retention Urinary retention, chronic indwelling cath, takes Oxybutynin.   Blood loss anemia Blood loss anemia, s/p 3 units PRBC transfusion, Hgb 11.6 11/25/19     Family/ staff Communication: plan of care reviewed with the patient and charge nurse.   Labs/tests ordered:  none  Time spend 35 minutes.

## 2019-12-24 NOTE — Assessment & Plan Note (Signed)
Orthostatic hypotension, neurogenic,tales Droxidopa, Midodrine.

## 2019-12-27 ENCOUNTER — Encounter: Payer: Self-pay | Admitting: Nurse Practitioner

## 2019-12-28 ENCOUNTER — Encounter: Payer: Self-pay | Admitting: Nurse Practitioner

## 2019-12-28 ENCOUNTER — Non-Acute Institutional Stay (SKILLED_NURSING_FACILITY): Payer: Medicare PPO | Admitting: Nurse Practitioner

## 2019-12-28 DIAGNOSIS — M159 Polyosteoarthritis, unspecified: Secondary | ICD-10-CM

## 2019-12-28 DIAGNOSIS — J209 Acute bronchitis, unspecified: Secondary | ICD-10-CM

## 2019-12-28 DIAGNOSIS — I495 Sick sinus syndrome: Secondary | ICD-10-CM | POA: Diagnosis not present

## 2019-12-28 DIAGNOSIS — I255 Ischemic cardiomyopathy: Secondary | ICD-10-CM

## 2019-12-28 DIAGNOSIS — E876 Hypokalemia: Secondary | ICD-10-CM | POA: Diagnosis not present

## 2019-12-28 DIAGNOSIS — K5909 Other constipation: Secondary | ICD-10-CM

## 2019-12-28 DIAGNOSIS — I951 Orthostatic hypotension: Secondary | ICD-10-CM

## 2019-12-28 DIAGNOSIS — E032 Hypothyroidism due to medicaments and other exogenous substances: Secondary | ICD-10-CM

## 2019-12-28 DIAGNOSIS — G2581 Restless legs syndrome: Secondary | ICD-10-CM

## 2019-12-28 DIAGNOSIS — R5381 Other malaise: Secondary | ICD-10-CM

## 2019-12-28 DIAGNOSIS — F339 Major depressive disorder, recurrent, unspecified: Secondary | ICD-10-CM

## 2019-12-28 DIAGNOSIS — R339 Retention of urine, unspecified: Secondary | ICD-10-CM

## 2019-12-28 DIAGNOSIS — I5040 Unspecified combined systolic (congestive) and diastolic (congestive) heart failure: Secondary | ICD-10-CM

## 2019-12-28 DIAGNOSIS — K219 Gastro-esophageal reflux disease without esophagitis: Secondary | ICD-10-CM

## 2019-12-28 DIAGNOSIS — D500 Iron deficiency anemia secondary to blood loss (chronic): Secondary | ICD-10-CM

## 2019-12-28 LAB — CBC AND DIFFERENTIAL
HCT: 34 — AB (ref 41–53)
Hemoglobin: 11.2 — AB (ref 13.5–17.5)
Neutrophils Absolute: 4277
Platelets: 329 (ref 150–399)
WBC: 7.4

## 2019-12-28 LAB — COMPREHENSIVE METABOLIC PANEL
Albumin: 3.2 — AB (ref 3.5–5.0)
Calcium: 8.6 — AB (ref 8.7–10.7)
Globulin: 3.3

## 2019-12-28 LAB — HEPATIC FUNCTION PANEL
ALT: 10 (ref 10–40)
AST: 17 (ref 14–40)
Alkaline Phosphatase: 97 (ref 25–125)
Bilirubin, Total: 0.3

## 2019-12-28 LAB — CBC: RBC: 3.64 — AB (ref 3.87–5.11)

## 2019-12-28 LAB — BASIC METABOLIC PANEL
BUN: 18 (ref 4–21)
CO2: 29 — AB (ref 13–22)
Chloride: 101 (ref 99–108)
Creatinine: 1 (ref 0.6–1.3)
Glucose: 108
Potassium: 4 (ref 3.4–5.3)
Sodium: 136 — AB (ref 137–147)

## 2019-12-28 NOTE — Assessment & Plan Note (Signed)
Hypokalemia, K 3.1 12/16/19, added Kcl, K 3.5 12/20/19

## 2019-12-28 NOTE — Progress Notes (Signed)
Location:   S[facility] [place] Room Number: 29-A Place of Service:  SNF (31) Provider: Lennie Odor Mast NP  [name], MD  Patient Care Team: [name], MD as PCP - General (Internal Medicine) [name], MD as PCP - Cardiology (Cardiology) [name], MD as Attending Physician (Cardiology) Vevelyn Royals, MD as Consulting Physician (Ophthalmology) Franchot Gallo, MD as Consulting Physician (Urology) Tanda Rockers, MD as Consulting Physician (Pulmonary Disease) Allyn Kenner, MD (Dermatology) Deliah Goody, PA-C as Physician Assistant (Physician Assistant) Mast, Man X, NP as Nurse Practitioner (Internal Medicine) Ngetich, Nelda Bucks, NP as Nurse Practitioner (Family Medicine)  Extended Emergency Contact Information Primary Emergency Contact: [name] of [place] Phone: [phone] Relation: Daughter Secondary Emergency Contact: Pavich,[name] Address: [address]          [place] Phone: [phone] Mobile Phone: [phone] Relation: Spouse  Code Status:  DNR Goals of care: Advanced Directive information Advanced Directives 12/28/2019  Does Patient Have a Medical Advance Directive? Yes  Type of Advance Directive Living will;Healthcare Power of Attorney  Does patient want to make changes to medical advance directive? No - Patient declined  Copy of [place] in Chart? Yes - validated most recent copy scanned in chart (See row information)  Would patient like information on creating a medical advance directive? -  Pre-existing out of facility DNR order (yellow form or pink MOST form) Yellow form placed in chart (order not valid for inpatient use);Pink MOST form placed in chart (order not valid for inpatient use)     Chief Complaint  Patient presents with  . Acute Visit    Patient is seen for deconditioning    HPI:  Pt is a 86 y.o. male seen today for an acute  visit for deconditioning, poor appetite, generalized weakness.  Acute bronchitis, treated with total Doxycycline 181m bid x 7 days started 12/19/19.12/20/19 CXR stable cardiomegaly w/o overt CHF, no acute parenchymal pathology, no significant interval change compared to 01/01/19 12/20/19 wbc 8.0, Hgb 11.1, plt 331, Na 139, K 3.5, Bun 14, creat 0.80, eGFR 79 CXR 2 weeks ago showed vascular congestion, Torsemide was increased to 280mqam, completed 1 wk additional Torsemide 1052mpm.  Hypokalemia, K 3.1 12/16/19, added Kcl, K 3.5 12/20/19 Hx of Afib/SSS s/p St Jude PPM 2010, ventricular tachycardia, takes Amiodarone Depression, takes Duloxetine 23m12m CAD, CABG x4, stents,off ASA due to hematuria.takes Plavix, Statin. CHF, takes Torsemide, EF 35-40%, OA knee pain, left lower back, takes tylenol, Robaxin.  GERD takes Pantoprazole Hypothyroidism, takes Levothyroxine.TSH1.67 11/25/19 Orthostatic hypotension, neurogenic,tales Droxidopa, Midodrine.  RLS, takes Ropinirole Constipation, takes Trulance, Senokot S, MiraLaxprn Urinary retention, chronic indwelling cath, takes Oxybutynin.  Blood loss anemia, s/p 3 units PRBC transfusion, Hgb 11.1 12/20/19        Past Medical History:      Past Medical History:  Diagnosis Date  . Arthritis    "minor, back and sometimes knees" (12/15/2012)  . Bradycardia    SSS s/p St Jude PPM 04/12/2008  . CAD (coronary artery disease) 12/30/2014   CABG (LIMA-LAD, SVG-RCA, SVG-OM in 1996).  07/2009 BMS to SVG-RCA. Cath in 04/2010 with patent stents   . Cardiomyopathy, ischemic 08/25/2012  . Chronic knee pain 12/03/2014  . Combined congestive systolic and diastolic heart failure (HCC)[place]/29/2016   Hx EF 41%. BNP 96.8 02/21/15 Torsemide 04/06/15 Na 142, K 4.6, Bun 16, creat 0.89 04/20/14 BNP 111.7, Na 142,  K 4.6, Bun 16, creat 0.9   . Depression with anxiety 02/02/2015   02/21/15 Hgb A1c 5.8 03/10/15 MMSE 30/30   . Dizziness, after diuretic asscoiated with hypotension and responded to fluid bolus 06/05/2011   04/28/15 US carotid R+L normal bilateral arterial velocities.    Marland Kitchen Dyspnea 08/11/2014   Followed in Pulmonary clinic/ [facility]/ [place]  - 08/11/2014  Walked RA x 1 laps @ 185 ft each stopped due to fatigue/off balance/ slow pace/  no sob or desat  - PFT's  09/26/2014  FEV1 2.26 (85 % ) ratio 76  p no % improvement from saba with DLCO  67 % corrects to 93 % for alv volume      Since prev study 08/04/13 minimal change lung vol or dlco    . Embolic cerebral infarction ([place]) 12/06/2015  . GERD (gastroesophageal reflux disease)   . Gout 02/09/2015  . Hiatal hernia   . Hyperlipidemia   . Hypertension   . Hypothyroid   . Influenza A 03/10/2016  . Insomnia   . Melanoma of back ([place]) 1976  . Myocardial infarction [facility]) 1996; 2011   "both silent" (12/15/2012)  . Nonrheumatic aortic valve stenosis   . Orthostatic hypotension   . Osteoporosis, senile   . Pacemaker   . RBBB   . Restless leg 02/02/2015  . Right leg weakness 12/06/2015  . [facility] spotted fever   . S/P CABG x 4   . Sick sinus syndrome ([place]) 01/31/2014  . Sustained ventricular tachycardia ([place]) 07/27/2014  . Urinary retention 12/30/2014   has a cathrater   Past Surgical History:  Procedure Laterality Date  . CARDIAC CATHETERIZATION  04/2010   LIMA to LAD patent,SVG to OM patent,no in-stnet restenosis RCA  . CATARACT EXTRACTION W/ INTRAOCULAR LENS  IMPLANT, BILATERAL Bilateral 2012  . CORONARY ANGIOPLASTY WITH STENT PLACEMENT  07/2009   bare metal stent to SVG to the RCA  . CORONARY ARTERY BYPASS GRAFT  1996   LIMA to LAD,SVG to RCA & SVG to OM  . INSERT / REPLACE / REMOVE PACEMAKER  2010  . MELANOMA EXCISION  05/1974 X2   "taken off my back" (12/15/2012)  . NM MYOVIEW LTD  06/2011   low risk  . PPM GENERATOR CHANGEOUT N/A  01/22/2017   Procedure: PPM GENERATOR CHANGEOUT;  Surgeon: [name], MD;  Location: [facility] [facility];  Service: Cardiovascular;  Laterality: N/A;  . TONSILLECTOMY  1938  . TRANSURETHRAL RESECTION OF PROSTATE  1986  . US ECHOCARDIOGRAPHY  07/11/2009   EF 45-50%    Allergies  Allergen Reactions  . Altace [Ramipril] Cough  . Crestor [Rosuvastatin Calcium] Rash  . Penicillins Rash and Other (See Comments)    Has patient had a PCN reaction causing immediate rash, facial/tongue/throat swelling, SOB or lightheadedness with hypotension: Yes Has patient had a PCN reaction causing severe rash involving mucus membranes or skin necrosis: No Has patient had a PCN reaction that required hospitalization No Has patient had a PCN reaction occurring within the last 10 years: No If all of the above answers are "NO", then may proceed with Cephalosporin use.   . Sulfa Antibiotics Rash  . Sulfamethoxazole-Trimethoprim Rash    Allergies as of 12/28/2019      Reactions   Altace [ramipril] Cough   Crestor [rosuvastatin Calcium] Rash   Penicillins Rash, Other (See Comments)   Has patient had a PCN reaction causing immediate rash, facial/tongue/throat swelling, SOB or lightheadedness with hypotension: Yes Has patient  had a PCN reaction causing severe rash involving mucus membranes or skin necrosis: No Has patient had a PCN reaction that required hospitalization No Has patient had a PCN reaction occurring within the last 10 years: No If all of the above answers are "NO", then may proceed with Cephalosporin use.   Sulfa Antibiotics Rash   Sulfamethoxazole-trimethoprim Rash      Medication List       Accurate as of December 28, 2019 11:59 PM. If you have any questions, ask your nurse or doctor.        acetaminophen 500 MG tablet Commonly known as: TYLENOL Take 500 mg by mouth 3 (three) times daily.   amiodarone 100 MG tablet Commonly known as: PACERONE Take 100 mg by mouth daily.    Anusol-HC 2.5 % rectal cream Generic drug: hydrocortisone Place 1 application rectally 2 (two) times daily.   clobetasol 0.05 % external solution Commonly known as: TEMOVATE Apply 1 application topically 2 (two) times daily as needed. Not to apply on face, groin, or underarms   clopidogrel 75 MG tablet Commonly known as: PLAVIX Take 75 mg by mouth daily.   clotrimazole-betamethasone cream Commonly known as: LOTRISONE Apply 1 application topically daily as needed. Apply to penis and scrotum   Droxidopa 100 MG Caps Take 1 capsule (100 mg total) by mouth in the morning, at noon, and at bedtime.   DULoxetine 20 MG capsule Commonly known as: CYMBALTA Take 20 mg by mouth daily. For anxiety,insomnia and back pain   feeding supplement (PRO-STAT SUGAR FREE 64) Liqd Take 30 mLs by mouth daily.   fluticasone 0.05 % cream Commonly known as: CUTIVATE Apply 1 application topically 2 (two) times daily.   fluticasone 50 MCG/ACT nasal spray Commonly known as: FLONASE Place 1 spray into both nostrils 2 (two) times daily.   guaiFENesin 100 MG/5ML Soln Commonly known as: ROBITUSSIN Take 5 mLs (100 mg total) by mouth every 4 (four) hours as needed for cough or to loosen phlegm.   Hydrocortisone-Aloe 1 % Crea Apply topically 2 (two) times daily as needed.   ketoconazole 2 % cream Commonly known as: NIZORAL Apply 1 application topically as needed for irritation. Apply small amount behind ears and in creases beside nose   ketoconazole 2 % shampoo Commonly known as: NIZORAL Apply 1 application topically 2 (two) times a week.   lactose free nutrition Liqd Take 237 mLs by mouth 2 (two) times daily between meals.   NUTRITIONAL SUPPLEMENT PO Take 1 each by mouth daily. Magic Cup with lunch   levothyroxine 100 MCG tablet Commonly known as: SYNTHROID Take 100 mcg by mouth daily before breakfast.   meclizine 25 MG tablet Commonly known as: ANTIVERT Take 1 tablet (25 mg total) by mouth 2  (two) times daily as needed for dizziness.   melatonin 3 MG Tabs tablet Take 3 mg by mouth at bedtime.   methocarbamol 500 MG tablet Commonly known as: ROBAXIN Take 250 mg by mouth 2 (two) times daily as needed for muscle spasms.   midodrine 5 MG tablet Commonly known as: PROAMATINE Take 5 mg by mouth 3 (three) times daily with meals. Notify provider if BP is < 90/60.   mineral oil-hydrophilic petrolatum ointment Apply 1 application topically 2 (two) times daily as needed for dry skin. To face   oxybutynin 10 MG 24 hr tablet Commonly known as: DITROPAN-XL Take 1 tablet (10 mg total) by mouth at bedtime.   pantoprazole 40 MG tablet Commonly known as: PROTONIX Take  40 mg by mouth daily.   Plecanatide 3 MG Tabs Commonly known as: Trulance Take 3 mg by mouth daily.   polyethylene glycol 17 g packet Commonly known as: MIRALAX / GLYCOLAX Take 17 g by mouth daily as needed for mild constipation. Take 1-2 capfuls in 8 oz of H2O   potassium chloride SA 20 MEQ tablet Commonly known as: KLOR-CON Take 20 mEq by mouth daily.   pravastatin 20 MG tablet Commonly known as: PRAVACHOL Take 20 mg by mouth daily.   rOPINIRole 0.25 MG tablet Commonly known as: REQUIP Take 0.5 mg by mouth 2 (two) times daily.   rOPINIRole 0.25 MG tablet Commonly known as: REQUIP Take 0.25 mg by mouth daily as needed.   sennosides-docusate sodium 8.6-50 MG tablet Commonly known as: SENOKOT-S Take 2 tablets by mouth 2 (two) times daily.   torsemide 10 MG tablet Commonly known as: DEMADEX Take 20 mg by mouth daily. 20 mg once a day   Vitamin D3 125 MCG (5000 UT) Caps Take 5,000 Units by mouth every Monday.       Review of Systems  Constitutional: Positive for activity change, appetite change, fatigue and unexpected weight change. Negative for fever.       Weight loss #7 Ibs in the past month.   HENT: Positive for hearing loss. Negative for congestion and voice change.   Eyes: Negative for  visual disturbance.  Respiratory: Positive for cough. Negative for chest tightness, shortness of breath and wheezing.        Chronic DOE, cough  Cardiovascular: Negative for leg swelling.  Gastrointestinal: Negative for abdominal pain and constipation.  Genitourinary: Positive for difficulty urinating. Negative for dysuria and hematuria.       Foley  Musculoskeletal: Positive for arthralgias, back pain and gait problem.       Right shoulder limited over head ROM with pain.  Positional lower back pain is chronic, chronic knee pain  Skin: Negative for color change.  Neurological: Negative for dizziness, speech difficulty, weakness and light-headedness.       Memory lapses. Mild expressive aphasia.   Psychiatric/Behavioral: Negative for behavioral problems and sleep disturbance. The patient is not nervous/anxious.     Immunization History  Administered Date(s) Administered  . Influenza, High Dose Seasonal PF 11/01/2014, 11/13/2016, 11/19/2018, 11/19/2018  . Influenza,inj,Quad PF,6+ Mos 11/24/2017  . Influenza,inj,quad, With Preservative 11/17/2017  . Influenza-Unspecified 12/05/2013, 11/05/2014, 11/16/2015, 11/09/2019  . Moderna SARS-COVID-2 Vaccination 02/08/2019, 03/08/2019  . PPD Test 03/07/2014  . Pneumococcal Conjugate-13 10/05/2009  . Pneumococcal Polysaccharide-23 11/05/2005  . Pneumococcal-Unspecified 10/05/2009  . Tdap 08/02/2014  . Zoster 11/05/2005   Pertinent  Health Maintenance Due  Topic Date Due  . DEXA SCAN  12/14/2024  . INFLUENZA VACCINE  Completed  . PNA vac Low Risk Adult  Completed   Fall Risk  10/09/2017 10/02/2016 10/30/2015 09/05/2015 02/02/2015  Falls in the past year? _0   Risk for fall due to : - - Impaired balance/gait - -   Functional Status Survey:    Vitals:   12/28/19 1426  BP: 123/69  Pulse: 70  Resp: 20  Temp: 97.7 F (36.5 C)  TempSrc: Oral  SpO2: 97%  Weight: 161 lb 1.6 oz (73.1 kg)  Height: _1  (1.778 m)   Body mass index  is 23.12 kg/m. Physical Exam Vitals and nursing note reviewed.  Constitutional:      Comments: Appears weak  HENT:     Head: Normocephalic and atraumatic.  Mouth/Throat:     Mouth: Mucous membranes are dry.  Eyes:     Extraocular Movements: Extraocular movements intact.     Conjunctiva/sclera: Conjunctivae normal.     Pupils: Pupils are equal, round, and reactive to light.  Cardiovascular:     Rate and Rhythm: Normal rate and regular rhythm.     Heart sounds: Murmur heard.      Comments: Left upper chest pace maker.  Pulmonary:     Breath sounds: No rales.     Comments: Bibasilar rales.  Abdominal:     General: Bowel sounds are normal.     Palpations: Abdomen is soft.     Tenderness: There is no abdominal tenderness.  Genitourinary:    Comments: Foley, dark blood urine in Foley catheter bag Musculoskeletal:     Cervical back: Normal range of motion and neck supple.     Right lower leg: No edema.     Left lower leg: No edema.     Comments: Reduced over head ROM of the right shoulder.  Positional lower back pain remains no change.   Skin:    General: Skin is warm and dry.     Findings: No bruising.     Comments: Appears dry  Neurological:     General: No focal deficit present.     Mental Status: He is alert. Mental status is at baseline.     Gait: Gait abnormal.     Comments: Oriented to person, place.   Psychiatric:        Mood and Affect: Mood normal.        Behavior: Behavior normal.        Thought Content: Thought content normal.     Labs reviewed: Recent Labs    08/29/19 0233 08/29/19 0233 08/30/19 0253 08/30/19 0253 08/31/19 0205 08/31/19 0205 09/01/19 0226 09/01/19 0226 09/02/19 0514 09/03/19 0545 11/29/19 0000 12/16/19 0000 12/20/19 0000 12/23/19 0000  NA 137   < > 135   < > 137   < > 138   < > 138  --    < > 141 139 138  K 3.7   < > 3.1*   < > 3.6   < > 3.8   < > 3.5  --    < > 3.1* 3.5 4.6  CL 102   < > 99   < > 102   < > 99   < > 95*  --     < > 100 100 100  CO2 24   < > 27   < > 25   < > 26   < > 30  --    < > 30* 31* 32*  GLUCOSE 98   < > 104*   < > 114*  --  101*  --  112*  --   --   --   --   --   BUN 16   < > 15   < > 13   < > 14   < > 17  --    < > _0 CREATININE 0.77   < > 0.72   < > 0.65   < > 0.65   < > 0.88  --    < > 0.9 0.8 1.0  CALCIUM 8.1*   < > 7.7*   < > 8.4*   < > 8.4*   < > 8.5*  --    < > 8.3* 8.4* 8.8  MG  2.7*  --   --   --  2.3  --   --   --   --  2.5*  --   --   --   --   PHOS 2.1*   < > 3.1  --  3.0  --   --   --   --  3.7  --   --   --   --    < > = values in this interval not displayed.   Recent Labs    08/27/19 0226 08/29/19 0233 11/29/19 0000  AST 20 33 16  ALT _0 ALKPHOS 53 55 78  BILITOT 0.8 0.7  --   PROT 5.7* 6.1*  --   ALBUMIN 2.7* 2.6* 3.4*   Recent Labs    08/26/19 0111 08/26/19 0127 08/31/19 0205 08/31/19 0205 09/01/19 0226 09/01/19 0226 09/03/19 0545 11/29/19 0000 12/20/19 0000  WBC 17.2*   < > 8.9   < > 8.0   < > 7.8 6.9 8.0  NEUTROABS 13.2*  --   --   --   --   --   --   --   --   HGB 12.4*   < > 10.3*   < > 10.2*   < > 11.1* 11.6* 11.1*  HCT 37.2*   < > 32.1*   < > 31.7*   < > 34.1* 35* 32*  MCV 99.2   < > 98.5  --  97.5  --  96.1  --   --   PLT 303   < > 218   < > 250   < > 322 232 331   < > = values in this interval not displayed.   Lab Results  Component Value Date   TSH 0.60 12/16/2019   Lab Results  Component Value Date   HGBA1C 5.8 (H) 08/26/2019   Lab Results  Component Value Date   CHOL 84 10/18/2019   HDL 32 (A) 10/18/2019   LDLCALC 31 10/18/2019   TRIG 133 10/18/2019   CHOLHDL 2.6 05/05/2017    Significant Diagnostic Results in last 30 days:  No results found.  Assessment/Plan Physical deconditioning Will repeat CBC/diff, CMP/eGFR, CXR ap/lateral, may consider Hospice service. HPOA daughter/wife agreed with the plan of care  Acute bronchitis  Acute bronchitis, fully  treated with total Doxycycline 171m bid x 7 days started  12/19/19.12/20/19 CXR stable cardiomegaly w/o overt CHF, no acute parenchymal pathology, no significant interval change compared to 01/01/19 12/20/19 wbc 8.0, Hgb 11.1, plt 331, Na 139, K 3.5, Bun 14, creat 0.80, eGFR 79 CXR 2 weeks ago showed vascular congestion, Torsemide was increased to 261mqam, completed 1 wk additional Torsemide 1068mpm.    Hypokalemia Hypokalemia, K 3.1 12/16/19, added Kcl, K 3.5 12/20/19   SSS (sick sinus syndrome) (HCCRewx of Afib/SSS s/p St Jude PPM 2010, ventricular tachycardia, takes Amiodarone   Depression, recurrent (HCC) Depression, takes Duloxetine 46m62m   Cardiomyopathy, ischemic CAD, CABG x4, stents,off ASA due to hematuria.takes Plavix, Statin.   Combined congestive systolic and diastolic heart failure (HCC) CHF, the patient appears dry skin, tongue/mouth, decreased oral intake, will hold Torsemide 11/23, 11/24, obtain CMP/eGFR, EF 35-40%,   Osteoarthritis OA knee pain, left lower back, takes tylenol, Robaxin.   GERD (gastroesophageal reflux disease) GERD takes Pantoprazole   Hypothyroidism Hypothyroidism, takes Levothyroxine.TSH1.67 11/25/19  Orthostatic hypotension Orthostatic hypotension, neurogenic,tales Droxidopa, Midodrine.    RLS (restless legs syndrome) RLS, takes Ropinirole  Chronic constipation Constipation, takes Trulance, Senokot S, MiraLaxprn  Urinary retention Urinary retention, chronic indwelling cath, takes Oxybutynin.   Blood loss anemia Blood loss anemia, s/p 3 units PRBC transfusion, Hgb 11.1 12/20/19     Family/ staff Communication: plan of care reviewed with the patient and charge nurse.   Labs/tests ordered:  CBC/diff, CMP/eGFR, CXR ap/lateral  Time spend 35 minutes.

## 2019-12-28 NOTE — Assessment & Plan Note (Signed)
Hx of Afib/SSS s/p St Jude PPM 2010, ventricular tachycardia, takes Amiodarone

## 2019-12-28 NOTE — Assessment & Plan Note (Signed)
RLS, takes Ropinirole

## 2019-12-28 NOTE — Assessment & Plan Note (Signed)
Blood loss anemia, s/p 3 units PRBC transfusion, Hgb 11.1 12/20/19

## 2019-12-28 NOTE — Assessment & Plan Note (Signed)
Hypothyroidism, takes Levothyroxine.YWX0.37 11/25/19

## 2019-12-28 NOTE — Assessment & Plan Note (Signed)
OA knee pain, left lower back, takes tylenol, Robaxin.

## 2019-12-28 NOTE — Assessment & Plan Note (Signed)
Constipation, takes Trulance, Senokot S, MiraLaxprn

## 2019-12-28 NOTE — Assessment & Plan Note (Addendum)
CHF, the patient appears dry skin, tongue/mouth, decreased oral intake, will hold Torsemide 11/23, 11/24, obtain CMP/eGFR, EF 35-40%,

## 2019-12-28 NOTE — Assessment & Plan Note (Signed)
Orthostatic hypotension, neurogenic,tales Droxidopa, Midodrine.

## 2019-12-28 NOTE — Assessment & Plan Note (Signed)
Acute bronchitis, fully  treated with total Doxycycline 174m bid x 7 days started 12/19/19.12/20/19 CXR stable cardiomegaly w/o overt CHF, no acute parenchymal pathology, no significant interval change compared to 01/01/19 12/20/19 wbc 8.0, Hgb 11.1, plt 331, Na 139, K 3.5, Bun 14, creat 0.80, eGFR 79 CXR 2 weeks ago showed vascular congestion, Torsemide was increased to 262mqam, completed 1 wk additional Torsemide 1041mpm.

## 2019-12-28 NOTE — Assessment & Plan Note (Signed)
CAD, CABG x4, stents,off ASA due to hematuria.takes Plavix, Statin.

## 2019-12-28 NOTE — Assessment & Plan Note (Signed)
Depression, takes Duloxetine 20mg  qd

## 2019-12-28 NOTE — Assessment & Plan Note (Addendum)
Will repeat CBC/diff, CMP/eGFR, CXR ap/lateral, may consider Hospice service. HPOA daughter/wife agreed with the plan of care 

## 2019-12-28 NOTE — Assessment & Plan Note (Signed)
Urinary retention, chronic indwelling cath, takes Oxybutynin.

## 2019-12-28 NOTE — Assessment & Plan Note (Signed)
GERD takes Pantoprazole °

## 2019-12-29 ENCOUNTER — Encounter: Payer: Self-pay | Admitting: Nurse Practitioner

## 2019-12-29 ENCOUNTER — Ambulatory Visit: Payer: Medicare PPO | Admitting: Cardiovascular Disease

## 2020-01-10 ENCOUNTER — Non-Acute Institutional Stay (SKILLED_NURSING_FACILITY): Payer: Medicare PPO | Admitting: Nurse Practitioner

## 2020-01-10 ENCOUNTER — Encounter: Payer: Self-pay | Admitting: Nurse Practitioner

## 2020-01-10 ENCOUNTER — Telehealth: Payer: Self-pay | Admitting: Orthopedic Surgery

## 2020-01-10 DIAGNOSIS — E032 Hypothyroidism due to medicaments and other exogenous substances: Secondary | ICD-10-CM

## 2020-01-10 DIAGNOSIS — F339 Major depressive disorder, recurrent, unspecified: Secondary | ICD-10-CM

## 2020-01-10 DIAGNOSIS — K5909 Other constipation: Secondary | ICD-10-CM

## 2020-01-10 DIAGNOSIS — R1084 Generalized abdominal pain: Secondary | ICD-10-CM

## 2020-01-10 DIAGNOSIS — G903 Multi-system degeneration of the autonomic nervous system: Secondary | ICD-10-CM

## 2020-01-10 DIAGNOSIS — R109 Unspecified abdominal pain: Secondary | ICD-10-CM | POA: Insufficient documentation

## 2020-01-10 DIAGNOSIS — K219 Gastro-esophageal reflux disease without esophagitis: Secondary | ICD-10-CM

## 2020-01-10 DIAGNOSIS — E876 Hypokalemia: Secondary | ICD-10-CM

## 2020-01-10 DIAGNOSIS — D500 Iron deficiency anemia secondary to blood loss (chronic): Secondary | ICD-10-CM

## 2020-01-10 DIAGNOSIS — R339 Retention of urine, unspecified: Secondary | ICD-10-CM

## 2020-01-10 DIAGNOSIS — G2581 Restless legs syndrome: Secondary | ICD-10-CM

## 2020-01-10 DIAGNOSIS — I5040 Unspecified combined systolic (congestive) and diastolic (congestive) heart failure: Secondary | ICD-10-CM

## 2020-01-10 DIAGNOSIS — I495 Sick sinus syndrome: Secondary | ICD-10-CM | POA: Diagnosis not present

## 2020-01-10 DIAGNOSIS — M159 Polyosteoarthritis, unspecified: Secondary | ICD-10-CM

## 2020-01-10 DIAGNOSIS — I350 Nonrheumatic aortic (valve) stenosis: Secondary | ICD-10-CM

## 2020-01-10 NOTE — Assessment & Plan Note (Addendum)
Urinary retention, chronic indwelling cath, takes Oxybutynin.

## 2020-01-10 NOTE — Assessment & Plan Note (Signed)
Blood loss anemia, s/p 3 units PRBC transfusion, Hgb 11.1 12/20/19

## 2020-01-10 NOTE — Assessment & Plan Note (Signed)
RLS, takes Ropinirole

## 2020-01-10 NOTE — Assessment & Plan Note (Signed)
Continue Kcl, Hypokalemia, K 4.6 11/18/214

## 2020-01-10 NOTE — Assessment & Plan Note (Signed)
OA knee pain, left lower back, takes Tylenol, Robaxin.

## 2020-01-10 NOTE — Assessment & Plan Note (Signed)
CHF, takes Torsemide, EF 35-40%,Bun/creat 20/1.0 12/23/19

## 2020-01-10 NOTE — Assessment & Plan Note (Signed)
Hypothyroidism, takes Levothyroxine.KJZ7.91 11/25/19

## 2020-01-10 NOTE — Assessment & Plan Note (Addendum)
Hx of Afib/SSS s/p St Jude PPM 2010, ventricular tachycardia, takes Amiodarone

## 2020-01-10 NOTE — Progress Notes (Addendum)
Location:    [facility] Room Number: 29 Place of Service:  SNF (31) Provider: Lennie Odor Salmaan Patchin NP  [name], MD  Patient Care Team: [name], MD as PCP - General (Internal Medicine) [name], MD as PCP - Cardiology (Cardiology) [name], MD as Attending Physician (Cardiology) Vevelyn Royals, MD as Consulting Physician (Ophthalmology) Franchot Gallo, MD as Consulting Physician (Urology) Tanda Rockers, MD as Consulting Physician (Pulmonary Disease) Allyn Kenner, MD (Dermatology) Deliah Goody, PA-C as Physician Assistant (Physician Assistant) Rashel Okeefe X, NP as Nurse Practitioner (Internal Medicine) Ngetich, Nelda Bucks, NP as Nurse Practitioner (Family Medicine)  Extended Emergency Contact Information Primary Emergency Contact: [name] of [place] Phone: [phone] Relation: Daughter Secondary Emergency Contact: Dulworth,[name] Address: [address]          [place] Phone: [phone] Mobile Phone: [phone] Relation: Spouse  Code Status:  DNR Goals of care: Advanced Directive information Advanced Directives 12/28/2019  Does Patient Have a Medical Advance Directive? Yes  Type of Advance Directive Living will;Healthcare Power of Attorney  Does patient want to make changes to medical advance directive? No - Patient declined  Copy of [place] in Chart? Yes - validated most recent copy scanned in chart (See row information)  Would patient like information on creating a medical advance directive? -  Pre-existing out of facility DNR order (yellow form or pink MOST form) Yellow form placed in chart (order not valid for inpatient use);Pink MOST form placed in chart (order not valid for inpatient use)     Chief Complaint  Patient presents with  . Acute Visit    Abdominal pain    HPI:  Pt is a 86 y.o. male seen today for an acute visit for  c/o abd pain, generalized, not sure of if its positive Murphy's sign or suprapubic tenderness or pain travels down to scrotal region or not. The patient has soft stool, no BM impaction upon my examination. Urine in yellow color in Foley Cath bag. He is afebrile.    Hypokalemia, K 4.6 11/18/214, taking Kcl Hx of Afib/SSS s/p St Jude PPM 2010, ventricular tachycardia, takes Amiodarone Depression, takes Duloxetine 52m qd CAD, CABG x4, stents,off ASA due to hematuria.takes Plavix, Statin. CHF, takes Torsemide, EF 35-40%,Bun/creat 20/1.0 12/23/19 OA knee pain, left lower back, takes Tylenol, Robaxin.  GERD takes Pantoprazole Hypothyroidism, takes Levothyroxine.TSH1.67 11/25/19 Orthostatic hypotension, neurogenic,tales Droxidopa, Midodrine.  RLS, takes Ropinirole Constipation, takes Trulance, Senokot S, MiraLaxprn Urinary retention, chronic indwelling cath, takes Oxybutynin.  Blood loss anemia, s/p 3 units PRBC transfusion, Hgb 11.1 12/20/19  Past Medical History:  Diagnosis Date  . Arthritis    "minor, back and sometimes knees" (12/15/2012)  . Bradycardia    SSS s/p St Jude PPM 04/12/2008  . CAD (coronary artery disease) 12/30/2014   CABG (LIMA-LAD, SVG-RCA, SVG-OM in 1996).  07/2009 BMS to SVG-RCA. Cath in 04/2010 with patent stents   . Cardiomyopathy, ischemic 08/25/2012  . Chronic knee pain 12/03/2014  . Combined congestive systolic and diastolic heart failure ([place] 02/02/2015   Hx EF 41%. BNP 96.8 02/21/15 Torsemide 04/06/15 Na 142, K 4.6, Bun 16, creat 0.89 04/20/14 BNP 111.7, Na 142, K 4.6, Bun 16, creat 0.9   . Depression with anxiety 02/02/2015   02/21/15 Hgb A1c 5.8 03/10/15 MMSE 30/30   . Dizziness, after diuretic asscoiated with hypotension and responded to fluid bolus 06/05/2011   04/28/15 UKoreacarotid  R+L normal bilateral arterial velocities.    Marland Kitchen Dyspnea 08/11/2014   Followed in Pulmonary clinic/ [facility]/ [place]  - 08/11/2014  Walked RA x 1 laps @ 185 ft each stopped due to  fatigue/off balance/ slow pace/  no sob or desat  - PFT's  09/26/2014  FEV1 2.26 (85 % ) ratio 76  p no % improvement from saba with DLCO  67 % corrects to 93 % for alv volume      Since prev study 08/04/13 minimal change lung vol or dlco    . Embolic cerebral infarction ([place]) 12/06/2015  . GERD (gastroesophageal reflux disease)   . Gout 02/09/2015  . Hiatal hernia   . Hyperlipidemia   . Hypertension   . Hypothyroid   . Influenza A 03/10/2016  . Insomnia   . Melanoma of back ([place]) 1976  . Myocardial infarction [facility]) 1996; 2011   "both silent" (12/15/2012)  . Nonrheumatic aortic valve stenosis   . Orthostatic hypotension   . Osteoporosis, senile   . Pacemaker   . RBBB   . Restless leg 02/02/2015  . Right leg weakness 12/06/2015  . [facility] spotted fever   . S/P CABG x 4   . Sick sinus syndrome ([place]) 01/31/2014  . Sustained ventricular tachycardia ([place]) 07/27/2014  . Urinary retention 12/30/2014   has a cathrater   Past Surgical History:  Procedure Laterality Date  . CARDIAC CATHETERIZATION  04/2010   LIMA to LAD patent,SVG to OM patent,no in-stnet restenosis RCA  . CATARACT EXTRACTION W/ INTRAOCULAR LENS  IMPLANT, BILATERAL Bilateral 2012  . CORONARY ANGIOPLASTY WITH STENT PLACEMENT  07/2009   bare metal stent to SVG to the RCA  . CORONARY ARTERY BYPASS GRAFT  1996   LIMA to LAD,SVG to RCA & SVG to OM  . INSERT / REPLACE / REMOVE PACEMAKER  2010  . MELANOMA EXCISION  05/1974 X2   "taken off my back" (12/15/2012)  . NM MYOVIEW LTD  06/2011   low risk  . PPM GENERATOR CHANGEOUT N/A 01/22/2017   Procedure: PPM GENERATOR CHANGEOUT;  Surgeon: [name], MD;  Location: [facility];  Service: Cardiovascular;  Laterality: N/A;  . TONSILLECTOMY  1938  . TRANSURETHRAL RESECTION OF PROSTATE  1986  . US ECHOCARDIOGRAPHY  07/11/2009   EF 45-50%    Allergies  Allergen Reactions  . Altace [Ramipril] Cough  . Crestor [Rosuvastatin Calcium] Rash  . Penicillins Rash and Other  (See Comments)    Has patient had a PCN reaction causing immediate rash, facial/tongue/throat swelling, SOB or lightheadedness with hypotension: Yes Has patient had a PCN reaction causing severe rash involving mucus membranes or skin necrosis: No Has patient had a PCN reaction that required hospitalization No Has patient had a PCN reaction occurring within the last 10 years: No If all of the above answers are "NO", then may proceed with Cephalosporin use.   . Sulfa Antibiotics Rash  . Sulfamethoxazole-Trimethoprim Rash    Allergies as of 01/10/2020      Reactions   Altace [ramipril] Cough   Crestor [rosuvastatin Calcium] Rash   Penicillins Rash, Other (See Comments)   Has patient had a PCN reaction causing immediate rash, facial/tongue/throat swelling, SOB or lightheadedness with hypotension: Yes Has patient had a PCN reaction causing severe rash involving mucus membranes or skin necrosis: No Has patient had a PCN reaction that required hospitalization No Has patient had a PCN reaction occurring within the last 10 years: No If all of the  above answers are "NO", then may proceed with Cephalosporin use.   Sulfa Antibiotics Rash   Sulfamethoxazole-trimethoprim Rash      Medication List       Accurate as of January 10, 2020 11:59 PM. If you have any questions, ask your nurse or doctor.        STOP taking these medications   clobetasol 0.05 % external solution Commonly known as: TEMOVATE Stopped by: Charnae Lill X Clarinda Obi, NP   fluticasone 0.05 % cream Commonly known as: CUTIVATE Stopped by: Jalessa Peyser X Amrit Erck, NP     TAKE these medications   acetaminophen 500 MG tablet Commonly known as: TYLENOL Take 500 mg by mouth 3 (three) times daily.   amiodarone 100 MG tablet Commonly known as: PACERONE Take 100 mg by mouth daily.   Anusol-HC 2.5 % rectal cream Generic drug: hydrocortisone Place 1 application rectally 2 (two) times daily.   clopidogrel 75 MG tablet Commonly known as: PLAVIX Take  75 mg by mouth daily.   clotrimazole-betamethasone cream Commonly known as: LOTRISONE Apply 1 application topically daily as needed. Apply to penis and scrotum   Droxidopa 100 MG Caps Take 1 capsule (100 mg total) by mouth in the morning, at noon, and at bedtime.   DULoxetine 20 MG capsule Commonly known as: CYMBALTA Take 20 mg by mouth daily. For anxiety,insomnia and back pain   feeding supplement (PRO-STAT SUGAR FREE 64) Liqd Take 30 mLs by mouth daily.   fluticasone 50 MCG/ACT nasal spray Commonly known as: FLONASE Place 1 spray into both nostrils 2 (two) times daily.   guaiFENesin 100 MG/5ML Soln Commonly known as: ROBITUSSIN Take 5 mLs (100 mg total) by mouth every 4 (four) hours as needed for cough or to loosen phlegm.   Hydrocortisone-Aloe 1 % Crea Apply topically 2 (two) times daily as needed.   ketoconazole 2 % cream Commonly known as: NIZORAL Apply 1 application topically as needed for irritation. Apply small amount behind ears and in creases beside nose   ketoconazole 2 % shampoo Commonly known as: NIZORAL Apply 1 application topically 2 (two) times a week.   lactose free nutrition Liqd Take 237 mLs by mouth 2 (two) times daily between meals.   NUTRITIONAL SUPPLEMENT PO Take 1 each by mouth daily. Magic Cup with lunch   levothyroxine 100 MCG tablet Commonly known as: SYNTHROID Take 100 mcg by mouth daily before breakfast.   meclizine 25 MG tablet Commonly known as: ANTIVERT Take 1 tablet (25 mg total) by mouth 2 (two) times daily as needed for dizziness.   melatonin 3 MG Tabs tablet Take 3 mg by mouth at bedtime.   methocarbamol 500 MG tablet Commonly known as: ROBAXIN Take 250 mg by mouth 2 (two) times daily as needed for muscle spasms.   midodrine 5 MG tablet Commonly known as: PROAMATINE Take 5 mg by mouth 3 (three) times daily with meals. Notify provider if BP is < 90/60.   mineral oil-hydrophilic petrolatum ointment Apply 1 application  topically 2 (two) times daily as needed for dry skin. To face   morphine 20 MG/ML concentrated solution Commonly known as: ROXANOL Take by mouth every 4 (four) hours as needed for severe pain. 100 mg/5 mL (20 mg/mL); amt: 5 mg/0.25 mL   oxybutynin 10 MG 24 hr tablet Commonly known as: DITROPAN-XL Take 1 tablet (10 mg total) by mouth at bedtime.   pantoprazole 40 MG tablet Commonly known as: PROTONIX Take 40 mg by mouth daily.   Plecanatide 3 MG  Tabs Commonly known as: Trulance Take 3 mg by mouth daily.   polyethylene glycol 17 g packet Commonly known as: MIRALAX / GLYCOLAX Take 17 g by mouth daily as needed for mild constipation. Take 1-2 capfuls in 8 oz of H2O   potassium chloride SA 20 MEQ tablet Commonly known as: KLOR-CON Take 20 mEq by mouth daily.   pravastatin 20 MG tablet Commonly known as: PRAVACHOL Take 20 mg by mouth daily.   rOPINIRole 0.25 MG tablet Commonly known as: REQUIP Take 0.5 mg by mouth 2 (two) times daily.   rOPINIRole 0.25 MG tablet Commonly known as: REQUIP Take 0.25 mg by mouth daily as needed.   sennosides-docusate sodium 8.6-50 MG tablet Commonly known as: SENOKOT-S Take 2 tablets by mouth 2 (two) times daily.   torsemide 10 MG tablet Commonly known as: DEMADEX Take 20 mg by mouth daily. 20 mg once a day   Vitamin D3 125 MCG (5000 UT) Caps Take 5,000 Units by mouth every Monday.       Review of Systems  Constitutional: Positive for activity change, appetite change and fatigue. Negative for fever.  HENT: Positive for hearing loss. Negative for congestion and voice change.   Eyes: Negative for visual disturbance.  Respiratory: Positive for cough. Negative for chest tightness, shortness of breath and wheezing.        Chronic DOE, cough  Cardiovascular: Negative for leg swelling.  Gastrointestinal: Positive for abdominal distention and abdominal pain. Negative for anal bleeding, blood in stool, constipation, diarrhea, nausea, rectal  pain and vomiting.  Genitourinary: Positive for difficulty urinating. Negative for dysuria and hematuria.       Foley  Musculoskeletal: Positive for arthralgias, back pain and gait problem.       Right shoulder limited over head ROM with pain.  Positional lower back pain is chronic, chronic knee pain  Skin: Negative for color change.  Neurological: Negative for dizziness, speech difficulty, weakness and light-headedness.       Memory lapses. Mild expressive aphasia.   Psychiatric/Behavioral: Negative for behavioral problems and sleep disturbance. The patient is not nervous/anxious.     Immunization History  Administered Date(s) Administered  . Influenza, High Dose Seasonal PF 11/01/2014, 11/13/2016, 11/19/2018, 11/19/2018  . Influenza,inj,Quad PF,6+ Mos 11/24/2017  . Influenza,inj,quad, With Preservative 11/17/2017  . Influenza-Unspecified 12/05/2013, 11/05/2014, 11/16/2015, 11/09/2019  . Moderna SARS-COVID-2 Vaccination 02/08/2019, 03/08/2019  . PPD Test 03/07/2014  . Pneumococcal Conjugate-13 10/05/2009  . Pneumococcal Polysaccharide-23 11/05/2005  . Pneumococcal-Unspecified 10/05/2009  . Tdap 08/02/2014  . Zoster 11/05/2005   Pertinent  Health Maintenance Due  Topic Date Due  . DEXA SCAN  12/14/2024  . INFLUENZA VACCINE  Completed  . PNA vac Low Risk Adult  Completed   Fall Risk  10/09/2017 10/02/2016 10/30/2015 09/05/2015 02/02/2015  Falls in the past year? No No No No No  Risk for fall due to : - - Impaired balance/gait - -   Functional Status Survey:    Vitals:   01/10/20 1420  BP: 106/62  Pulse: 71  Resp: 18  Temp: 98.1 F (36.7 C)  SpO2: 93%  Weight: 159 lb 1.6 oz (72.2 kg)  Height: '5\' 10"'  (1.778 m)   Body mass index is 22.83 kg/m. Physical Exam Vitals and nursing note reviewed.  Constitutional:      Comments: Appears weak  HENT:     Head: Normocephalic and atraumatic.     Mouth/Throat:     Mouth: Mucous membranes are dry.  Eyes:  Extraocular  Movements: Extraocular movements intact.     Conjunctiva/sclera: Conjunctivae normal.     Pupils: Pupils are equal, round, and reactive to light.  Cardiovascular:     Rate and Rhythm: Normal rate and regular rhythm.     Heart sounds: Murmur heard.      Comments: Left upper chest pace maker.  Pulmonary:     Breath sounds: No rales.     Comments: Bibasilar rales.  Abdominal:     General: Bowel sounds are normal. There is distension.     Tenderness: There is abdominal tenderness. There is no right CVA tenderness, left CVA tenderness, guarding or rebound.     Comments: BS present  Genitourinary:    Comments: Foley, dark blood urine in Foley catheter bag Musculoskeletal:     Cervical back: Normal range of motion and neck supple.     Right lower leg: No edema.     Left lower leg: No edema.     Comments: Reduced over head ROM of the right shoulder.  Positional lower back pain remains no change.   Skin:    General: Skin is warm and dry.     Findings: No bruising.     Comments: Appears dry  Neurological:     General: No focal deficit present.     Mental Status: He is alert. Mental status is at baseline.     Gait: Gait abnormal.     Comments: Oriented to person, place.   Psychiatric:        Mood and Affect: Mood normal.        Behavior: Behavior normal.        Thought Content: Thought content normal.     Labs reviewed: Recent Labs    08/29/19 0233 08/29/19 0233 08/30/19 0253 08/30/19 0253 08/31/19 0205 08/31/19 0205 09/01/19 0226 09/01/19 0226 09/02/19 0514 09/03/19 0545 11/29/19 0000 12/20/19 0000 12/23/19 0000 12/28/19 0000  NA 137   < > 135   < > 137   < > 138   < > 138  --    < > 139 138 136*  K 3.7   < > 3.1*   < > 3.6   < > 3.8   < > 3.5  --    < > 3.5 4.6 4.0  CL 102   < > 99   < > 102   < > 99   < > 95*  --    < > 100 100 101  CO2 24   < > 27   < > 25   < > 26   < > 30  --    < > 31* 32* 29*  GLUCOSE 98   < > 104*   < > 114*  --  101*  --  112*  --   --   --    --   --   BUN 16   < > 15   < > 13   < > 14   < > 17  --    < > '14 20 18  ' CREATININE 0.77   < > 0.72   < > 0.65   < > 0.65   < > 0.88  --    < > 0.8 1.0 1.0  CALCIUM 8.1*   < > 7.7*   < > 8.4*   < > 8.4*   < > 8.5*  --    < > 8.4* 8.8 8.6*  MG 2.7*  --   --   --  2.3  --   --   --   --  2.5*  --   --   --   --   PHOS 2.1*   < > 3.1  --  3.0  --   --   --   --  3.7  --   --   --   --    < > = values in this interval not displayed.   Recent Labs    08/27/19 0226 08/27/19 0226 08/29/19 0233 11/29/19 0000 12/28/19 0000  AST 20   < > 33 16 17  ALT 12   < > '18 11 10  ' ALKPHOS 53   < > 55 78 97  BILITOT 0.8  --  0.7  --   --   PROT 5.7*  --  6.1*  --   --   ALBUMIN 2.7*   < > 2.6* 3.4* 3.2*   < > = values in this interval not displayed.   Recent Labs    03/15/19 0000 08/26/19 0111 08/26/19 0127 08/31/19 0205 08/31/19 0205 09/01/19 0226 09/01/19 0226 09/03/19 0545 11/29/19 0000 12/20/19 0000 12/28/19 0000  WBC   < > 17.2*   < > 8.9   < > 8.0   < > 7.8 6.9 8.0 7.4  NEUTROABS  --  13.2*  --   --   --   --   --   --   --   --  4,277.00  HGB   < > 12.4*   < > 10.3*   < > 10.2*   < > 11.1* 11.6* 11.1* 11.2*  HCT   < > 37.2*   < > 32.1*   < > 31.7*   < > 34.1* 35* 32* 34*  MCV  --  99.2   < > 98.5  --  97.5  --  96.1  --   --   --   PLT   < > 303   < > 218   < > 250   < > 322 232 331 329   < > = values in this interval not displayed.   Lab Results  Component Value Date   TSH 0.60 12/16/2019   Lab Results  Component Value Date   HGBA1C 5.8 (H) 08/26/2019   Lab Results  Component Value Date   CHOL 84 10/18/2019   HDL 32 (A) 10/18/2019   LDLCALC 31 10/18/2019   TRIG 133 10/18/2019   CHOLHDL 2.6 05/05/2017    Significant Diagnostic Results in last 30 days:  No results found.  Assessment/Plan Abdominal pain c/o abd pain, generalized, not sure of if its positive Murphy's sign or suprapubic tenderness or pain travels down to scrotal region or not. The patient has soft  stool, no BM impaction upon my examination. Urine in yellow color in Foley Cath bag. He is afebrile.  Will obtain X-ray abd, adding Morphine prn for pain. Observe. May consider CXR, CBC/diff, CMP/eGFR, UA C/S, [place] abd if persists.  01/11/20 resolved pain, X-ray abd severe gaseous distension of bowel loops compatible with adynamic ileus, liquid diet, observe.   Hypokalemia Continue Kcl, Hypokalemia, K 4.6 11/18/214  SSS (sick sinus syndrome) (HCC) Hx of Afib/SSS s/p St Jude PPM 2010, ventricular tachycardia, takes Amiodarone   Depression, recurrent (HCC) Depression, takes Duloxetine 57m qd   Aortic valve stenosis, nonrheumatic CAD, CABG x4, stents,off ASA due to hematuria.takes Plavix, Statin.  Combined congestive systolic and diastolic heart failure (HCC) CHF, takes Torsemide, EF  35-40%,Bun/creat 20/1.0 12/23/19  Osteoarthritis OA knee pain, left lower back, takes Tylenol, Robaxin.    GERD (gastroesophageal reflux disease) GERD takes Pantoprazole   Hypothyroidism Hypothyroidism, takes Levothyroxine.TSH1.67 11/25/19  Neurogenic orthostatic hypotension (HCC) Orthostatic hypotension, neurogenic,tales Droxidopa, Midodrine.    RLS (restless legs syndrome) RLS, takes Ropinirole  Chronic constipation Constipation, takes Trulance, Senokot S, MiraLaxprn   Urinary retention Urinary retention, chronic indwelling cath, takes Oxybutynin.   Blood loss anemia Blood loss anemia, s/p 3 units PRBC transfusion, Hgb 11.1 12/20/19     Family/ staff Communication: plan of care reviewed with the patient, the patient's daughter/wife, Hospice nurse, and charge nurse.   Labs/tests ordered:  X-ray abd  Time spend 25 minutes.

## 2020-01-10 NOTE — Assessment & Plan Note (Signed)
Depression, takes Duloxetine 20mg  qd

## 2020-01-10 NOTE — Assessment & Plan Note (Signed)
CAD, CABG x4, stents,off ASA due to hematuria.takes Plavix, Statin.

## 2020-01-10 NOTE — Assessment & Plan Note (Signed)
Orthostatic hypotension, neurogenic,tales Droxidopa, Midodrine.

## 2020-01-10 NOTE — Assessment & Plan Note (Addendum)
c/o abd pain, generalized, not sure of if its positive Murphy's sign or suprapubic tenderness or pain travels down to scrotal region or not. The patient has soft stool, no BM impaction upon my examination. Urine in yellow color in Foley Cath bag. He is afebrile.  Will obtain X-ray abd, adding Morphine prn for pain. Observe. May consider CXR, CBC/diff, CMP/eGFR, UA C/S, [place] abd if persists.  01/11/20 resolved pain, X-ray abd severe gaseous distension of bowel loops compatible with adynamic ileus, liquid diet, observe.

## 2020-01-10 NOTE — Assessment & Plan Note (Signed)
GERD takes Pantoprazole °

## 2020-01-10 NOTE — Assessment & Plan Note (Signed)
Constipation, takes Trulance, Senokot S, MiraLaxprn

## 2020-01-11 NOTE — Telephone Encounter (Signed)
Abdominal x-ray positive for ileus. Vitals stable at this time. Given morphine for pain. No nausea. I advised facility nurse from [place] to seek advice from Hospice, since treatment typically requires hospitalization and surgical consult.

## 2020-01-12 ENCOUNTER — Non-Acute Institutional Stay (SKILLED_NURSING_FACILITY): Payer: Medicare PPO | Admitting: Internal Medicine

## 2020-01-12 ENCOUNTER — Encounter: Payer: Self-pay | Admitting: Internal Medicine

## 2020-01-12 DIAGNOSIS — R634 Abnormal weight loss: Secondary | ICD-10-CM

## 2020-01-12 DIAGNOSIS — K5909 Other constipation: Secondary | ICD-10-CM

## 2020-01-12 DIAGNOSIS — G2581 Restless legs syndrome: Secondary | ICD-10-CM

## 2020-01-12 DIAGNOSIS — R1084 Generalized abdominal pain: Secondary | ICD-10-CM

## 2020-01-12 DIAGNOSIS — K567 Ileus, unspecified: Secondary | ICD-10-CM

## 2020-01-12 DIAGNOSIS — R630 Anorexia: Secondary | ICD-10-CM

## 2020-01-12 DIAGNOSIS — F339 Major depressive disorder, recurrent, unspecified: Secondary | ICD-10-CM

## 2020-01-12 DIAGNOSIS — E032 Hypothyroidism due to medicaments and other exogenous substances: Secondary | ICD-10-CM

## 2020-01-12 DIAGNOSIS — I350 Nonrheumatic aortic (valve) stenosis: Secondary | ICD-10-CM

## 2020-01-12 DIAGNOSIS — I5040 Unspecified combined systolic (congestive) and diastolic (congestive) heart failure: Secondary | ICD-10-CM

## 2020-01-12 DIAGNOSIS — G903 Multi-system degeneration of the autonomic nervous system: Secondary | ICD-10-CM

## 2020-01-12 NOTE — Progress Notes (Signed)
Location:    [place] Room Number: 29 Place of Service:  SNF [phone]) Provider:  Veleta Miners MD  [name], MD  Patient Care Team: [name], MD as PCP - General (Internal Medicine) [name], MD as PCP - Cardiology (Cardiology) [name], MD as Attending Physician (Cardiology) Vevelyn Royals, MD as Consulting Physician (Ophthalmology) Franchot Gallo, MD as Consulting Physician (Urology) Tanda Rockers, MD as Consulting Physician (Pulmonary Disease) Allyn Kenner, MD (Dermatology) Deliah Goody, PA-C as Physician Assistant (Physician Assistant) Mast, Man X, NP as Nurse Practitioner (Internal Medicine) Ngetich, Nelda Bucks, NP as Nurse Practitioner (Family Medicine)  Extended Emergency Contact Information Primary Emergency Contact: [name] of [place] Phone: [phone] Relation: Daughter Secondary Emergency Contact: Spillman,[name] Address: [address]          [place] Phone: [phone] Mobile Phone: [phone] Relation: Spouse  Code Status:  DNR Goals of care: Advanced Directive information Advanced Directives 12/28/2019  Does Patient Have a Medical Advance Directive? Yes  Type of Advance Directive Living will;Healthcare Power of Attorney  Does patient want to make changes to medical advance directive? No - Patient declined  Copy of [place] in Chart? Yes - validated most recent copy scanned in chart (See row information)  Would patient like information on creating a medical advance directive? -  Pre-existing out of facility DNR order (yellow form or pink MOST form) Yellow form placed in chart (order not valid for inpatient use);Pink MOST form placed in chart (order not valid for inpatient use)     Chief Complaint  Patient presents with  . Acute Visit    Abdominal Pain    HPI:  Pt is a 86 y.o. male few days ago for abdominal pain  today for an acute visit for abdominal pain with adynamic ileus on x-ray with constipation  He has h/o CAD s/p Bypass surgery with Extensive Inferior Wall Scarring,  Ischemic Cardiomyopathy withsystolic heart failureEF of 35-40%Moderate AS, history of ventricular tachycardia stable on amiodarone , sinus node dysfunction with dual-chamber PPP,  severe orthostatic hypotension neurogenic,history of compression fractures, history of ischemic stroke in the past on dual therapy.,History of chronic indwelling Foley catheter  Severe AS Cardiology Recommended Palliative care  Was seen few days ago for abdominal pain.  KUB was ordered which showed adynamic ileus with constipation.  Patient was put on liquid diet.  He did get 1 dose of MiraLAX and had a good bowel movement yesterday. He states his pain is little better.  No nausea or vomiting does feel like he can eat more. Patient continues to get weaker has lost 10 pounds is under hospice care now.  Also has developed skin pressure stage I on his bottom.  Is not in any kind of distress denies any shortness of breath or cough. Daughter was in the room.   Past Medical History:  Diagnosis Date  . Arthritis    "minor, back and sometimes knees" (12/15/2012)  . Bradycardia    SSS s/p St Jude PPM 04/12/2008  . CAD (coronary artery disease) 12/30/2014   CABG (LIMA-LAD, SVG-RCA, SVG-OM in 1996).  07/2009 BMS to SVG-RCA. Cath in 04/2010 with patent stents   . Cardiomyopathy, ischemic 08/25/2012  . Chronic knee pain 12/03/2014  . Combined congestive systolic and diastolic heart failure ([place]) 02/02/2015   Hx EF 41%. BNP 96.8 02/21/15 Torsemide 04/06/15 Na 142, K 4.6, Bun 16, creat 0.89  04/20/14 BNP 111.7, Na 142, K 4.6, Bun 16, creat 0.9   . Depression with anxiety 02/02/2015   02/21/15 Hgb A1c 5.8 03/10/15 MMSE 30/30   . Dizziness, after diuretic asscoiated with hypotension and responded to fluid bolus 06/05/2011   04/28/15 US carotid R+L normal bilateral  arterial velocities.    Marland Kitchen Dyspnea 08/11/2014   Followed in Pulmonary clinic/ [facility]/ [place]  - 08/11/2014  Walked RA x 1 laps @ 185 ft each stopped due to fatigue/off balance/ slow pace/  no sob or desat  - PFT's  09/26/2014  FEV1 2.26 (85 % ) ratio 76  p no % improvement from saba with DLCO  67 % corrects to 93 % for alv volume      Since prev study 08/04/13 minimal change lung vol or dlco    . Embolic cerebral infarction ([facility]) 12/06/2015  . GERD (gastroesophageal reflux disease)   . Gout 02/09/2015  . Hiatal hernia   . Hyperlipidemia   . Hypertension   . Hypothyroid   . Influenza A 03/10/2016  . Insomnia   . Melanoma of back ([place]) 1976  . Myocardial infarction [facility]) 1996; 2011   "both silent" (12/15/2012)  . Nonrheumatic aortic valve stenosis   . Orthostatic hypotension   . Osteoporosis, senile   . Pacemaker   . RBBB   . Restless leg 02/02/2015  . Right leg weakness 12/06/2015  . [facility] spotted fever   . S/P CABG x 4   . Sick sinus syndrome ([place]) 01/31/2014  . Sustained ventricular tachycardia ([place]) 07/27/2014  . Urinary retention 12/30/2014   has a cathrater   Past Surgical History:  Procedure Laterality Date  . CARDIAC CATHETERIZATION  04/2010   LIMA to LAD patent,SVG to OM patent,no in-stnet restenosis RCA  . CATARACT EXTRACTION W/ INTRAOCULAR LENS  IMPLANT, BILATERAL Bilateral 2012  . CORONARY ANGIOPLASTY WITH STENT PLACEMENT  07/2009   bare metal stent to SVG to the RCA  . CORONARY ARTERY BYPASS GRAFT  1996   LIMA to LAD,SVG to RCA & SVG to OM  . INSERT / REPLACE / REMOVE PACEMAKER  2010  . MELANOMA EXCISION  05/1974 X2   "taken off my back" (12/15/2012)  . NM MYOVIEW LTD  06/2011   low risk  . PPM GENERATOR CHANGEOUT N/A 01/22/2017   Procedure: PPM GENERATOR CHANGEOUT;  Surgeon: [name], MD;  Location: [place] CV LAB;  Service: Cardiovascular;  Laterality: N/A;  . TONSILLECTOMY  1938  . TRANSURETHRAL RESECTION OF PROSTATE  1986  . US  ECHOCARDIOGRAPHY  07/11/2009   EF 45-50%    Allergies  Allergen Reactions  . Altace [Ramipril] Cough  . Crestor [Rosuvastatin Calcium] Rash  . Penicillins Rash and Other (See Comments)    Has patient had a PCN reaction causing immediate rash, facial/tongue/throat swelling, SOB or lightheadedness with hypotension: Yes Has patient had a PCN reaction causing severe rash involving mucus membranes or skin necrosis: No Has patient had a PCN reaction that required hospitalization No Has patient had a PCN reaction occurring within the last 10 years: No If all of the above answers are "NO", then may proceed with Cephalosporin use.   . Sulfa Antibiotics Rash  . Sulfamethoxazole-Trimethoprim Rash    Allergies as of 01/12/2020      Reactions   Altace [ramipril] Cough   Crestor [rosuvastatin Calcium] Rash   Penicillins Rash, Other (See Comments)   Has patient had a PCN reaction causing immediate rash, facial/tongue/throat swelling, SOB or  lightheadedness with hypotension: Yes Has patient had a PCN reaction causing severe rash involving mucus membranes or skin necrosis: No Has patient had a PCN reaction that required hospitalization No Has patient had a PCN reaction occurring within the last 10 years: No If all of the above answers are "NO", then may proceed with Cephalosporin use.   Sulfa Antibiotics Rash   Sulfamethoxazole-trimethoprim Rash      Medication List       Accurate as of January 12, 2020 10:43 AM. If you have any questions, ask your nurse or doctor.        acetaminophen 500 MG tablet Commonly known as: TYLENOL Take 500 mg by mouth 3 (three) times daily.   amiodarone 100 MG tablet Commonly known as: PACERONE Take 100 mg by mouth daily.   Anusol-HC 2.5 % rectal cream Generic drug: hydrocortisone Place 1 application rectally 2 (two) times daily.   clopidogrel 75 MG tablet Commonly known as: PLAVIX Take 75 mg by mouth daily.   clotrimazole-betamethasone cream Commonly  known as: LOTRISONE Apply 1 application topically daily as needed. Apply to penis and scrotum   Droxidopa 100 MG Caps Take 1 capsule (100 mg total) by mouth in the morning, at noon, and at bedtime.   DULoxetine 20 MG capsule Commonly known as: CYMBALTA Take 20 mg by mouth daily. For anxiety,insomnia and back pain   feeding supplement (PRO-STAT SUGAR FREE 64) Liqd Take 30 mLs by mouth daily.   fluticasone 50 MCG/ACT nasal spray Commonly known as: FLONASE Place 1 spray into both nostrils 2 (two) times daily.   guaiFENesin 100 MG/5ML Soln Commonly known as: ROBITUSSIN Take 5 mLs (100 mg total) by mouth every 4 (four) hours as needed for cough or to loosen phlegm.   Hydrocortisone-Aloe 1 % Crea Apply topically 2 (two) times daily as needed.   ketoconazole 2 % cream Commonly known as: NIZORAL Apply 1 application topically as needed for irritation. Apply small amount behind ears and in creases beside nose   ketoconazole 2 % shampoo Commonly known as: NIZORAL Apply 1 application topically 2 (two) times a week.   lactose free nutrition Liqd Take 237 mLs by mouth 2 (two) times daily between meals.   NUTRITIONAL SUPPLEMENT PO Take 1 each by mouth daily. Magic Cup with lunch   levothyroxine 100 MCG tablet Commonly known as: SYNTHROID Take 100 mcg by mouth daily before breakfast.   meclizine 25 MG tablet Commonly known as: ANTIVERT Take 1 tablet (25 mg total) by mouth 2 (two) times daily as needed for dizziness.   melatonin 3 MG Tabs tablet Take 3 mg by mouth at bedtime.   methocarbamol 500 MG tablet Commonly known as: ROBAXIN Take 250 mg by mouth 2 (two) times daily as needed for muscle spasms.   midodrine 5 MG tablet Commonly known as: PROAMATINE Take 5 mg by mouth 3 (three) times daily with meals. Notify provider if BP is < 90/60.   mineral oil-hydrophilic petrolatum ointment Apply 1 application topically 2 (two) times daily as needed for dry skin. To face    morphine 20 MG/ML concentrated solution Commonly known as: ROXANOL Take by mouth every 4 (four) hours as needed for severe pain. 100 mg/5 mL (20 mg/mL); amt: 5 mg/0.25 mL   oxybutynin 10 MG 24 hr tablet Commonly known as: DITROPAN-XL Take 1 tablet (10 mg total) by mouth at bedtime.   pantoprazole 40 MG tablet Commonly known as: PROTONIX Take 40 mg by mouth daily.   Plecanatide 3  MG Tabs Commonly known as: Trulance Take 3 mg by mouth daily.   polyethylene glycol 17 g packet Commonly known as: MIRALAX / GLYCOLAX Take 17 g by mouth daily as needed for mild constipation. Take 1-2 capfuls in 8 oz of H2O   potassium chloride SA 20 MEQ tablet Commonly known as: KLOR-CON Take 20 mEq by mouth daily.   pravastatin 20 MG tablet Commonly known as: PRAVACHOL Take 20 mg by mouth daily.   rOPINIRole 0.25 MG tablet Commonly known as: REQUIP Take 0.5 mg by mouth 2 (two) times daily.   rOPINIRole 0.25 MG tablet Commonly known as: REQUIP Take 0.25 mg by mouth daily as needed.   sennosides-docusate sodium 8.6-50 MG tablet Commonly known as: SENOKOT-S Take 2 tablets by mouth 2 (two) times daily.   torsemide 10 MG tablet Commonly known as: DEMADEX Take 20 mg by mouth daily. 20 mg once a day   Vitamin D3 125 MCG (5000 UT) Caps Take 5,000 Units by mouth every Monday.       Review of Systems  Constitutional: Positive for activity change, appetite change and unexpected weight change.  HENT: Negative.   Respiratory: Negative for cough.   Cardiovascular: Negative for leg swelling.  Gastrointestinal: Positive for abdominal distention, abdominal pain and constipation.  Genitourinary: Negative.   Musculoskeletal: Positive for arthralgias, back pain and gait problem.  Skin: Positive for color change.  Neurological: Positive for dizziness and weakness.  Psychiatric/Behavioral: Negative.     Immunization History  Administered Date(s) Administered  . Influenza, High Dose Seasonal PF  11/01/2014, 11/13/2016, 11/19/2018, 11/19/2018  . Influenza,inj,Quad PF,6+ Mos 11/24/2017  . Influenza,inj,quad, With Preservative 11/17/2017  . Influenza-Unspecified 12/05/2013, 11/05/2014, 11/16/2015, 11/09/2019  . Moderna SARS-COVID-2 Vaccination 02/08/2019, 03/08/2019  . PPD Test 03/07/2014  . Pneumococcal Conjugate-13 10/05/2009  . Pneumococcal Polysaccharide-23 11/05/2005  . Pneumococcal-Unspecified 10/05/2009  . Tdap 08/02/2014  . Zoster 11/05/2005   Pertinent  Health Maintenance Due  Topic Date Due  . DEXA SCAN  12/14/2024  . INFLUENZA VACCINE  Completed  . PNA vac Low Risk Adult  Completed   Fall Risk  10/09/2017 10/02/2016 10/30/2015 09/05/2015 02/02/2015  Falls in the past year? No No No No No  Risk for fall due to : - - Impaired balance/gait - -   Functional Status Survey:    Vitals:   01/12/20 1028  BP: 108/63  Pulse: 70  Resp: 20  Temp: (!) 97.4 F (36.3 C)  SpO2: 96%  Weight: 155 lb (70.3 kg)  Height: 5\' 10"  (1.778 m)   Body mass index is 22.24 kg/m. Physical Exam Vitals reviewed.  Constitutional:      Appearance: Normal appearance.  HENT:     Head: Normocephalic.     Nose: Nose normal.     Mouth/Throat:     Mouth: Mucous membranes are dry.     Pharynx: Oropharynx is clear.  Eyes:     Pupils: Pupils are equal, round, and reactive to light.  Cardiovascular:     Rate and Rhythm: Normal rate. Rhythm irregular.     Pulses: Normal pulses.     Heart sounds: Murmur heard.    Pulmonary:     Effort: Pulmonary effort is normal. No respiratory distress.     Breath sounds: Normal breath sounds. No wheezing or rales.  Abdominal:     Comments: Positive for distention and generalized abdominal pain. Bowel sounds were present.  Musculoskeletal:        General: No swelling.     Cervical  back: Neck supple.  Skin:    General: Skin is warm.  Neurological:     General: No focal deficit present.     Mental Status: He is alert and oriented to person, place, and  time.  Psychiatric:        Mood and Affect: Mood normal.     Labs reviewed: Recent Labs    08/29/19 0233 08/29/19 0233 08/30/19 0253 08/30/19 0253 08/31/19 0205 08/31/19 0205 09/01/19 0226 09/01/19 0226 09/02/19 0514 09/03/19 0545 11/29/19 0000 12/20/19 0000 12/23/19 0000 12/28/19 0000  NA 137   < > 135   < > 137   < > 138   < > 138  --    < > 139 138 136*  K 3.7   < > 3.1*   < > 3.6   < > 3.8   < > 3.5  --    < > 3.5 4.6 4.0  CL 102   < > 99   < > 102   < > 99   < > 95*  --    < > 100 100 101  CO2 24   < > 27   < > 25   < > 26   < > 30  --    < > 31* 32* 29*  GLUCOSE 98   < > 104*   < > 114*  --  101*  --  112*  --   --   --   --   --   BUN 16   < > 15   < > 13   < > 14   < > 17  --    < > 14 20 18   CREATININE 0.77   < > 0.72   < > 0.65   < > 0.65   < > 0.88  --    < > 0.8 1.0 1.0  CALCIUM 8.1*   < > 7.7*   < > 8.4*   < > 8.4*   < > 8.5*  --    < > 8.4* 8.8 8.6*  MG 2.7*  --   --   --  2.3  --   --   --   --  2.5*  --   --   --   --   PHOS 2.1*   < > 3.1  --  3.0  --   --   --   --  3.7  --   --   --   --    < > = values in this interval not displayed.   Recent Labs    08/27/19 0226 08/27/19 0226 08/29/19 0233 11/29/19 0000 12/28/19 0000  AST 20   < > 33 16 17  ALT 12   < > 18 11 10   ALKPHOS 53   < > 55 78 97  BILITOT 0.8  --  0.7  --   --   PROT 5.7*  --  6.1*  --   --   ALBUMIN 2.7*   < > 2.6* 3.4* 3.2*   < > = values in this interval not displayed.   Recent Labs    03/15/19 0000 08/26/19 0111 08/26/19 0127 08/31/19 0205 08/31/19 0205 09/01/19 0226 09/01/19 0226 09/03/19 0545 11/29/19 0000 12/20/19 0000 12/28/19 0000  WBC   < > 17.2*   < > 8.9   < > 8.0   < > 7.8 6.9 8.0 7.4  NEUTROABS  --  13.2*  --   --   --   --   --   --   --   --  4,277.00  HGB   < > 12.4*   < > 10.3*   < > 10.2*   < > 11.1* 11.6* 11.1* 11.2*  HCT   < > 37.2*   < > 32.1*   < > 31.7*   < > 34.1* 35* 32* 34*  MCV  --  99.2   < > 98.5  --  97.5  --  96.1  --   --   --   PLT   <  > 303   < > 218   < > 250   < > 322 232 331 329   < > = values in this interval not displayed.   Lab Results  Component Value Date   TSH 0.60 12/16/2019   Lab Results  Component Value Date   HGBA1C 5.8 (H) 08/26/2019   Lab Results  Component Value Date   CHOL 84 10/18/2019   HDL 32 (A) 10/18/2019   LDLCALC 31 10/18/2019   TRIG 133 10/18/2019   CHOLHDL 2.6 05/05/2017    Significant Diagnostic Results in last 30 days:  No results found.  Assessment/Plan Ileus (HCC) Advance diet as tolerated Miralax QD for 3 days Generalized abdominal pain Repeated  Xray Showed Constipation is better but Ileus is worse Clinically patient is better  Chronic constipation Continue Miralax and Trulance Weight loss D/w daughter Poor appetite Hold Remeron right now Aortic valve stenosis, nonrheumatic Palliative care Combined systolic and diastolic congestive heart failure, unspecified HF chronicity (HCC) Continue Low dose of Demadex  Depression, recurrent (HCC) On Cymbalta Would add Remeron when he is more stable D/W Daughter RLS (restless legs syndrome) Continue Requip Neurogenic orthostatic hypotension (HCC) On Midodrine and Doxidopa Hypothyroidism due to medication Continue Synthyroid Coronary artery disease Plavix and Statin  Ventricular tachycardia (HCC) Continue on amiodarone  Hospice Care Discontinue Vit D, Statin, Flonase Change Ditropan to 5 mg  Family/ staff Communication:   Labs/tests ordered:

## 2020-01-18 ENCOUNTER — Telehealth: Payer: Self-pay | Admitting: Cardiovascular Disease

## 2020-01-18 NOTE — Telephone Encounter (Signed)
Pt c/o medication issue:  1. Name of Medication: Droxidopa 100 MG CAPS  2. How are you currently taking this medication (dosage and times per day)? 1 tablet 3 times a day  3. Are you having a reaction (difficulty breathing--STAT)? no  4. What is your medication issue? Ryan from [employer] to speak with Lattie Haw in regards to prior authorization. Ref: [id number]

## 2020-01-18 NOTE — Telephone Encounter (Signed)
Returned the call to CoverMyMeds. They did not have the correct insurance on file so could not complete the PA for the [name]. This has all been updated.   Prior Auth has been submitted: [id number]

## 2020-01-19 NOTE — Telephone Encounter (Signed)
PA has been approved starting 02/05/20

## 2020-01-27 ENCOUNTER — Non-Acute Institutional Stay (SKILLED_NURSING_FACILITY): Payer: Medicare PPO | Admitting: Nurse Practitioner

## 2020-01-27 ENCOUNTER — Encounter: Payer: Self-pay | Admitting: Nurse Practitioner

## 2020-01-27 DIAGNOSIS — K5909 Other constipation: Secondary | ICD-10-CM

## 2020-01-27 DIAGNOSIS — L89302 Pressure ulcer of unspecified buttock, stage 2: Secondary | ICD-10-CM

## 2020-01-27 DIAGNOSIS — D500 Iron deficiency anemia secondary to blood loss (chronic): Secondary | ICD-10-CM

## 2020-01-27 DIAGNOSIS — I472 Ventricular tachycardia, unspecified: Secondary | ICD-10-CM

## 2020-01-27 DIAGNOSIS — K219 Gastro-esophageal reflux disease without esophagitis: Secondary | ICD-10-CM

## 2020-01-27 DIAGNOSIS — N319 Neuromuscular dysfunction of bladder, unspecified: Secondary | ICD-10-CM

## 2020-01-27 DIAGNOSIS — G8929 Other chronic pain: Secondary | ICD-10-CM

## 2020-01-27 DIAGNOSIS — F339 Major depressive disorder, recurrent, unspecified: Secondary | ICD-10-CM | POA: Diagnosis not present

## 2020-01-27 DIAGNOSIS — I951 Orthostatic hypotension: Secondary | ICD-10-CM

## 2020-01-27 DIAGNOSIS — E876 Hypokalemia: Secondary | ICD-10-CM

## 2020-01-27 DIAGNOSIS — M25569 Pain in unspecified knee: Secondary | ICD-10-CM

## 2020-01-27 DIAGNOSIS — E032 Hypothyroidism due to medicaments and other exogenous substances: Secondary | ICD-10-CM

## 2020-01-27 DIAGNOSIS — G2581 Restless legs syndrome: Secondary | ICD-10-CM

## 2020-01-27 DIAGNOSIS — I5040 Unspecified combined systolic (congestive) and diastolic (congestive) heart failure: Secondary | ICD-10-CM

## 2020-01-27 DIAGNOSIS — I255 Ischemic cardiomyopathy: Secondary | ICD-10-CM

## 2020-01-27 NOTE — Assessment & Plan Note (Signed)
excoriated superficial open areas with nonblanchable base in the sacral/R+L left buttocks, no odorous purulent drainage. Mixture of pressure ulcers and excoriated dermatitis. Will apply Nystatin cream+0.5% Triamcinolone cream then cover with generous amount of barrier oint bid until healed, avoid moist and pressures. Observe.

## 2020-01-27 NOTE — Assessment & Plan Note (Signed)
CHF, takes Torsemide, EF 35-40%,Bun/creat 18/1.0 12/28/19

## 2020-01-27 NOTE — Assessment & Plan Note (Signed)
Urinary retention, chronic indwelling cath, takes Oxybutynin 

## 2020-01-27 NOTE — Assessment & Plan Note (Signed)
Hypokalemia, K4.0 12/28/19, taking Kcl  

## 2020-01-27 NOTE — Assessment & Plan Note (Signed)
Hypothyroidism, takes Levothyroxine.TSH1.67 11/25/19 

## 2020-01-27 NOTE — Assessment & Plan Note (Signed)
RLS, takes Ropinirole  

## 2020-01-27 NOTE — Assessment & Plan Note (Signed)
Blood loss anemia, s/p 3 units PRBC transfusion, Hgb 11.2 12/28/19

## 2020-01-27 NOTE — Assessment & Plan Note (Signed)
Depression, takes Duloxetine 20mg qd  

## 2020-01-27 NOTE — Progress Notes (Signed)
Location:   SNF [place] Room Number: 75 Place of Service:  SNF (31) Provider: Lennie Odor Gearlene Godsil NP  [name], MD  Patient Care Team: [name], MD as PCP - General (Internal Medicine) [name], MD as PCP - Cardiology (Cardiology) [name], MD as Attending Physician (Cardiology) Vevelyn Royals, MD as Consulting Physician (Ophthalmology) Franchot Gallo, MD as Consulting Physician (Urology) Tanda Rockers, MD as Consulting Physician (Pulmonary Disease) Allyn Kenner, MD (Dermatology) Deliah Goody, PA-C as Physician Assistant (Physician Assistant) Deyjah Kindel X, NP as Nurse Practitioner (Internal Medicine) Ngetich, Nelda Bucks, NP as Nurse Practitioner (Family Medicine)  Extended Emergency Contact Information Primary Emergency Contact: [name] of [place] Phone: [phone] Relation: Daughter Secondary Emergency Contact: Evitt,[name] Address: [address]          [place] Phone: [phone] Mobile Phone: [phone] Relation: Spouse  Code Status:  DNR Goals of care: Advanced Directive information Advanced Directives 12/28/2019  Does Patient Have a Medical Advance Directive? Yes  Type of Advance Directive Living will;Healthcare Power of Attorney  Does patient want to make changes to medical advance directive? No - Patient declined  Copy of [place] in Chart? Yes - validated most recent copy scanned in chart (See row information)  Would patient like information on creating a medical advance directive? -  Pre-existing out of facility DNR order (yellow form or pink MOST form) Yellow form placed in chart (order not valid for inpatient use);Pink MOST form placed in chart (order not valid for inpatient use)     Chief Complaint  Patient presents with  . Acute Visit    Pressure ulcer    HPI:  Pt is a 86 y.o. male seen today for an acute visit for excoriated  superficial open areas with nonblanchable base in the sacral/R+L left buttocks, no odorous purulent drainage.   Hypokalemia, K 4.0 12/28/19, taking Kcl  Hx of Afib/SSS s/p St Jude PPM 2010, ventricular tachycardia, takes Amiodarone  Depression, takes Duloxetine 20mg  qd  CAD, CABG x4, stents,off ASA due to hematuria.takes Plavix  CHF, takes Torsemide, EF 35-40%,Bun/creat 18/1.0 12/28/19  OA knee pain, left lower back, takes Tylenol, Robaxin.   GERD takes Pantoprazole  Hypothyroidism, takes Levothyroxine.TSH1.67 11/25/19 Orthostatic hypotension, neurogenic,tales Droxidopa, Midodrine.  RLS, takes Ropinirole   Constipation, takes Trulance, Senokot S, MiraLaxprn   Urinary retention, chronic indwelling cath, takes Oxybutynin.     Blood loss anemia, s/p 3 units PRBC transfusion, Hgb 11.2 12/28/19  Past Medical History:  Diagnosis Date  . Arthritis    "minor, back and sometimes knees" (12/15/2012)  . Bradycardia    SSS s/p St Jude PPM 04/12/2008  . CAD (coronary artery disease) 12/30/2014   CABG (LIMA-LAD, SVG-RCA, SVG-OM in 1996).  07/2009 BMS to SVG-RCA. Cath in 04/2010 with patent stents   . Cardiomyopathy, ischemic 08/25/2012  . Chronic knee pain 12/03/2014  . Combined congestive systolic and diastolic heart failure ([place]) 02/02/2015   Hx EF 41%. BNP 96.8 02/21/15 Torsemide 04/06/15 Na 142, K 4.6, Bun 16, creat 0.89 04/20/14 BNP 111.7, Na 142, K 4.6, Bun 16, creat 0.9   . Depression with anxiety 02/02/2015   02/21/15 Hgb A1c 5.8 03/10/15 MMSE 30/30   . Dizziness, after diuretic asscoiated with hypotension and responded to fluid bolus 06/05/2011   04/28/15 US carotid R+L normal bilateral arterial velocities.    Marland Kitchen Dyspnea 08/11/2014   Followed in Pulmonary clinic/ [facility]/ [place]  -  08/11/2014  Walked RA x 1 laps @ 185 ft each stopped due to fatigue/off balance/ slow pace/  no sob or desat  - PFT's  09/26/2014  FEV1 2.26 (85 % ) ratio 76  p no % improvement from saba with DLCO  67 % corrects to 93  % for alv volume      Since prev study 08/04/13 minimal change lung vol or dlco    . Embolic cerebral infarction ([place]) 12/06/2015  . GERD (gastroesophageal reflux disease)   . Gout 02/09/2015  . Hiatal hernia   . Hyperlipidemia   . Hypertension   . Hypothyroid   . Influenza A 03/10/2016  . Insomnia   . Melanoma of back ([place]) 1976  . Myocardial infarction [facility]) 1996; 2011   "both silent" (12/15/2012)  . Nonrheumatic aortic valve stenosis   . Orthostatic hypotension   . Osteoporosis, senile   . Pacemaker   . RBBB   . Restless leg 02/02/2015  . Right leg weakness 12/06/2015  . [facility] spotted fever   . S/P CABG x 4   . Sick sinus syndrome ([place]) 01/31/2014  . Sustained ventricular tachycardia ([place]) 07/27/2014  . Urinary retention 12/30/2014   has a cathrater   Past Surgical History:  Procedure Laterality Date  . CARDIAC CATHETERIZATION  04/2010   LIMA to LAD patent,SVG to OM patent,no in-stnet restenosis RCA  . CATARACT EXTRACTION W/ INTRAOCULAR LENS  IMPLANT, BILATERAL Bilateral 2012  . CORONARY ANGIOPLASTY WITH STENT PLACEMENT  07/2009   bare metal stent to SVG to the RCA  . CORONARY ARTERY BYPASS GRAFT  1996   LIMA to LAD,SVG to RCA & SVG to OM  . INSERT / REPLACE / REMOVE PACEMAKER  2010  . MELANOMA EXCISION  05/1974 X2   "taken off my back" (12/15/2012)  . NM MYOVIEW LTD  06/2011   low risk  . PPM GENERATOR CHANGEOUT N/A 01/22/2017   Procedure: PPM GENERATOR CHANGEOUT;  Surgeon: [name], MD;  Location: [place] [facility];  Service: Cardiovascular;  Laterality: N/A;  . TONSILLECTOMY  1938  . TRANSURETHRAL RESECTION OF PROSTATE  1986  . US ECHOCARDIOGRAPHY  07/11/2009   EF 45-50%    Allergies  Allergen Reactions  . Altace [Ramipril] Cough  . Crestor [Rosuvastatin Calcium] Rash  . Penicillins Rash and Other (See Comments)    Has patient had a PCN reaction causing immediate rash, facial/tongue/throat swelling, SOB or lightheadedness with hypotension: Yes Has  patient had a PCN reaction causing severe rash involving mucus membranes or skin necrosis: No Has patient had a PCN reaction that required hospitalization No Has patient had a PCN reaction occurring within the last 10 years: No If all of the above answers are "NO", then may proceed with Cephalosporin use.   . Sulfa Antibiotics Rash  . Sulfamethoxazole-Trimethoprim Rash    Allergies as of 01/27/2020      Reactions   Altace [ramipril] Cough   Crestor [rosuvastatin Calcium] Rash   Penicillins Rash, Other (See Comments)   Has patient had a PCN reaction causing immediate rash, facial/tongue/throat swelling, SOB or lightheadedness with hypotension: Yes Has patient had a PCN reaction causing severe rash involving mucus membranes or skin necrosis: No Has patient had a PCN reaction that required hospitalization No Has patient had a PCN reaction occurring within the last 10 years: No If all of the above answers are "NO", then may proceed with Cephalosporin use.   Sulfa Antibiotics Rash   Sulfamethoxazole-trimethoprim Rash  Medication List       Accurate as of January 27, 2020  3:14 PM. If you have any questions, ask your nurse or doctor.        STOP taking these medications   fluticasone 50 MCG/ACT nasal spray Commonly known as: FLONASE Stopped by: Collyn Ribas X Jolicia Delira, NP   meclizine 25 MG tablet Commonly known as: ANTIVERT Stopped by: Krisanne Lich X Makynlee Kressin, NP     TAKE these medications   acetaminophen 500 MG tablet Commonly known as: TYLENOL Take 500 mg by mouth 3 (three) times daily.   amiodarone 100 MG tablet Commonly known as: PACERONE Take 100 mg by mouth daily.   clopidogrel 75 MG tablet Commonly known as: PLAVIX Take 75 mg by mouth daily.   clotrimazole-betamethasone cream Commonly known as: LOTRISONE Apply 1 application topically daily as needed. Apply to penis and scrotum   Droxidopa 100 MG Caps Take 1 capsule (100 mg total) by mouth in the morning, at noon, and at  bedtime.   DULoxetine 20 MG capsule Commonly known as: CYMBALTA Take 20 mg by mouth daily. For anxiety,insomnia and back pain   feeding supplement (PRO-STAT SUGAR FREE 64) Liqd Take 30 mLs by mouth daily.   guaiFENesin 100 MG/5ML liquid Commonly known as: ROBITUSSIN Take 200 mg by mouth every 4 (four) hours as needed for cough.   hydrocortisone 2.5 % rectal cream Commonly known as: ANUSOL-HC Place 1 application rectally 2 (two) times daily.   Hydrocortisone-Aloe 1 % Crea Apply topically 2 (two) times daily as needed.   ketoconazole 2 % cream Commonly known as: NIZORAL Apply 1 application topically as needed for irritation. Apply small amount behind ears and in creases beside nose What changed: Another medication with the same name was removed. Continue taking this medication, and follow the directions you see here. Changed by: Justina Bertini X Byan Poplaski, NP   lactose free nutrition Liqd Take 237 mLs by mouth 2 (two) times daily between meals.   NUTRITIONAL SUPPLEMENT PO Take 1 each by mouth daily. Magic Cup with lunch   levothyroxine 100 MCG tablet Commonly known as: SYNTHROID Take 100 mcg by mouth daily before breakfast.   melatonin 3 MG Tabs tablet Take 3 mg by mouth at bedtime.   methocarbamol 500 MG tablet Commonly known as: ROBAXIN Take 250 mg by mouth 2 (two) times daily as needed for muscle spasms.   midodrine 5 MG tablet Commonly known as: PROAMATINE Take 5 mg by mouth 3 (three) times daily with meals. Notify provider if BP is < 90/60.   mineral oil-hydrophilic petrolatum ointment Apply 1 application topically 2 (two) times daily as needed for dry skin. To face   morphine 20 MG/ML concentrated solution Commonly known as: ROXANOL Take by mouth every 4 (four) hours as needed for severe pain. 100 mg/5 mL (20 mg/mL); amt: 5 mg/0.25 mL   nystatin cream Commonly known as: MYCOSTATIN Apply 1 application topically 2 (two) times daily. To face and reddened areas BID until  healed.   oxybutynin 10 MG 24 hr tablet Commonly known as: DITROPAN-XL Take 1 tablet (10 mg total) by mouth at bedtime.   pantoprazole 40 MG tablet Commonly known as: PROTONIX Take 40 mg by mouth daily.   Plecanatide 3 MG Tabs Commonly known as: Trulance Take 3 mg by mouth daily.   polyethylene glycol 17 g packet Commonly known as: MIRALAX / GLYCOLAX Take 17 g by mouth daily as needed for mild constipation. Take 1-2 capfuls in 8 oz of H2O  potassium chloride SA 20 MEQ tablet Commonly known as: KLOR-CON Take 20 mEq by mouth daily.   rOPINIRole 0.25 MG tablet Commonly known as: REQUIP Take 0.5 mg by mouth 2 (two) times daily.   rOPINIRole 0.25 MG tablet Commonly known as: REQUIP Take 0.25 mg by mouth daily as needed.   sennosides-docusate sodium 8.6-50 MG tablet Commonly known as: SENOKOT-S Take 2 tablets by mouth daily.   torsemide 10 MG tablet Commonly known as: DEMADEX Take 20 mg by mouth daily. 20 mg once a day   triamcinolone cream 0.5 % Commonly known as: KENALOG Apply 1 application topically 2 (two) times daily.       Review of Systems  Constitutional: Negative for activity change, appetite change and fever.  HENT: Positive for hearing loss. Negative for congestion and voice change.   Eyes: Negative for visual disturbance.  Respiratory: Positive for cough. Negative for chest tightness, shortness of breath and wheezing.        Chronic DOE, cough  Cardiovascular: Negative for leg swelling.  Gastrointestinal: Negative for abdominal pain and constipation.  Genitourinary: Positive for difficulty urinating. Negative for dysuria and hematuria.       Foley  Musculoskeletal: Positive for arthralgias, back pain and gait problem.       Right shoulder limited over head ROM with pain.  Positional lower back pain is chronic, chronic knee pain  Skin: Positive for rash and wound. Negative for color change.  Neurological: Negative for speech difficulty, weakness,  light-headedness and numbness.       Memory lapses. Mild expressive aphasia.   Psychiatric/Behavioral: Negative for behavioral problems and sleep disturbance. The patient is not nervous/anxious.     Immunization History  Administered Date(s) Administered  . Influenza, High Dose Seasonal PF 11/01/2014, 11/13/2016, 11/19/2018, 11/19/2018  . Influenza,inj,Quad PF,6+ Mos 11/24/2017  . Influenza,inj,quad, With Preservative 11/17/2017  . Influenza-Unspecified 12/05/2013, 11/05/2014, 11/16/2015, 11/09/2019  . Moderna Sars-Covid-2 Vaccination 02/08/2019, 03/08/2019  . PPD Test 03/07/2014  . Pneumococcal Conjugate-13 10/05/2009  . Pneumococcal Polysaccharide-23 11/05/2005  . Pneumococcal-Unspecified 10/05/2009  . Tdap 08/02/2014  . Zoster 11/05/2005   Pertinent  Health Maintenance Due  Topic Date Due  . DEXA SCAN  12/14/2024  . INFLUENZA VACCINE  Completed  . PNA vac Low Risk Adult  Completed   Fall Risk  10/09/2017 10/02/2016 10/30/2015 09/05/2015 02/02/2015  Falls in the past year? No No No No No  Risk for fall due to : - - Impaired balance/gait - -   Functional Status Survey:    Vitals:   01/27/20 1103  BP: 129/75  Pulse: 70  Resp: 16  Temp: 97.6 F (36.4 C)  SpO2: 97%  Weight: 154 lb 8 oz (70.1 kg)  Height: 5\' 10"  (1.778 m)   Body mass index is 22.17 kg/m. Physical Exam Vitals and nursing note reviewed.  Constitutional:      Appearance: Normal appearance.  HENT:     Head: Normocephalic and atraumatic.     Nose: Nose normal.     Mouth/Throat:     Mouth: Mucous membranes are moist.  Eyes:     Extraocular Movements: Extraocular movements intact.     Conjunctiva/sclera: Conjunctivae normal.     Pupils: Pupils are equal, round, and reactive to light.  Cardiovascular:     Rate and Rhythm: Normal rate and regular rhythm.     Heart sounds: Murmur heard.      Comments: Left upper chest pace maker.  Pulmonary:     Breath sounds: No rales.  Comments: Bibasilar rales.   Abdominal:     General: Bowel sounds are normal.     Tenderness: There is no abdominal tenderness.     Comments: BS present  Genitourinary:    Comments: Foley, dark blood urine in Foley catheter bag Musculoskeletal:     Cervical back: Normal range of motion and neck supple.     Right lower leg: No edema.     Left lower leg: No edema.     Comments: Reduced over head ROM of the right shoulder.  Positional lower back pain remains no change.   Skin:    General: Skin is warm and dry.     Comments: excoriated superficial open areas with nonblanchable base in the sacral/R+L left buttocks, no odorous purulent drainage  Neurological:     General: No focal deficit present.     Mental Status: He is alert. Mental status is at baseline.     Gait: Gait abnormal.     Comments: Oriented to person, place.   Psychiatric:        Mood and Affect: Mood normal.        Behavior: Behavior normal.        Thought Content: Thought content normal.     Labs reviewed: Recent Labs    08/29/19 0233 08/30/19 0253 08/31/19 0205 09/01/19 0226 09/02/19 0514 09/03/19 0545 11/29/19 0000 12/20/19 0000 12/23/19 0000 12/28/19 0000  NA 137 135 137 138 138  --    < > 139 138 136*  K 3.7 3.1* 3.6 3.8 3.5  --    < > 3.5 4.6 4.0  CL 102 99 102 99 95*  --    < > 100 100 101  CO2 24 27 25 26 30   --    < > 31* 32* 29*  GLUCOSE 98 104* 114* 101* 112*  --   --   --   --   --   BUN 16 15 13 14 17   --    < > 14 20 18   CREATININE 0.77 0.72 0.65 0.65 0.88  --    < > 0.8 1.0 1.0  CALCIUM 8.1* 7.7* 8.4* 8.4* 8.5*  --    < > 8.4* 8.8 8.6*  MG 2.7*  --  2.3  --   --  2.5*  --   --   --   --   PHOS 2.1* 3.1 3.0  --   --  3.7  --   --   --   --    < > = values in this interval not displayed.   Recent Labs    08/27/19 0226 08/29/19 0233 11/29/19 0000 12/28/19 0000  AST 20 33 16 17  ALT 12 18 11 10   ALKPHOS 53 55 78 97  BILITOT 0.8 0.7  --   --   PROT 5.7* 6.1*  --   --   ALBUMIN 2.7* 2.6* 3.4* 3.2*   Recent Labs     08/26/19 0111 08/26/19 0127 08/31/19 0205 09/01/19 0226 09/03/19 0545 11/29/19 0000 12/20/19 0000 12/28/19 0000  WBC 17.2*   < > 8.9 8.0 7.8 6.9 8.0 7.4  NEUTROABS 13.2*  --   --   --   --   --   --  4,277.00  HGB 12.4*   < > 10.3* 10.2* 11.1* 11.6* 11.1* 11.2*  HCT 37.2*   < > 32.1* 31.7* 34.1* 35* 32* 34*  MCV 99.2   < > 98.5 97.5 96.1  --   --   --  PLT 303   < > 218 250 322 232 331 329   < > = values in this interval not displayed.   Lab Results  Component Value Date   TSH 0.60 12/16/2019   Lab Results  Component Value Date   HGBA1C 5.8 (H) 08/26/2019   Lab Results  Component Value Date   CHOL 84 10/18/2019   HDL 32 (A) 10/18/2019   LDLCALC 31 10/18/2019   TRIG 133 10/18/2019   CHOLHDL 2.6 05/05/2017    Significant Diagnostic Results in last 30 days:  No results found.  Assessment/Plan Pressure ulcer, stage 2 (HCC) excoriated superficial open areas with nonblanchable base in the sacral/R+L left buttocks, no odorous purulent drainage. Mixture of pressure ulcers and excoriated dermatitis. Will apply Nystatin cream+0.5% Triamcinolone cream then cover with generous amount of barrier oint bid until healed, avoid moist and pressures. Observe.   Hypokalemia Hypokalemia, K 4.0 12/28/19, taking Kcl   Ventricular tachycardia (HCC) Hx of Afib/SSS s/p St Jude PPM 2010, ventricular tachycardia, takes Amiodarone   Depression, recurrent (HCC) Depression, takes Duloxetine 20mg  qd   Cardiomyopathy, ischemic CAD, CABG x4, stents,off ASA due to hematuria.takes Plavix   Combined congestive systolic and diastolic heart failure (HCC) CHF, takes Torsemide, EF 35-40%,Bun/creat 18/1.0 12/28/19   Chronic knee pain OA knee pain, left lower back, takes Tylenol, Robaxin.   GERD (gastroesophageal reflux disease) GERD takes Pantoprazole  Hypothyroidism Hypothyroidism, takes Levothyroxine.TSH1.67 11/25/19   Orthostatic hypotension Orthostatic hypotension,  neurogenic,tales Droxidopa, Midodrine.   RLS (restless legs syndrome) RLS, takes Ropinirole   Chronic constipation Constipation, takes Trulance, Senokot S, MiraLaxprn   Neurogenic bladder Urinary retention, chronic indwelling cath, takes Oxybutynin.    Blood loss anemia Blood loss anemia, s/p 3 units PRBC transfusion, Hgb 11.2 12/28/19      Family/ staff Communication: plan of care reviewed with the patient and charge nurse.   Labs/tests ordered: none  Time spend 25 minutes.

## 2020-01-27 NOTE — Assessment & Plan Note (Signed)
GERD takes Pantoprazole °

## 2020-01-27 NOTE — Assessment & Plan Note (Signed)
Constipation, takes Trulance, Senokot S, MiraLax prn  

## 2020-01-27 NOTE — Assessment & Plan Note (Signed)
Hx of Afib/SSS s/p St Jude PPM 2010, ventricular tachycardia, takes Amiodarone 

## 2020-01-27 NOTE — Assessment & Plan Note (Signed)
Orthostatic hypotension, neurogenic,tales Droxidopa, Midodrine.   

## 2020-01-27 NOTE — Assessment & Plan Note (Signed)
OA knee pain, left lower back, takes Tylenol, Robaxin.   

## 2020-01-27 NOTE — Assessment & Plan Note (Signed)
CAD, CABG x4, stents,off ASA due to hematuria.takes Plavix

## 2020-02-10 ENCOUNTER — Ambulatory Visit (INDEPENDENT_AMBULATORY_CARE_PROVIDER_SITE_OTHER): Payer: Medicare PPO

## 2020-02-10 DIAGNOSIS — I495 Sick sinus syndrome: Secondary | ICD-10-CM

## 2020-02-11 LAB — CUP PACEART REMOTE DEVICE CHECK
Battery Remaining Longevity: 112 mo
Battery Remaining Percentage: 95.5 %
Battery Voltage: 2.98 V
Brady Statistic AP VP Percent: 39 %
Brady Statistic AP VS Percent: 61 %
Brady Statistic AS VP Percent: 1 %
Brady Statistic AS VS Percent: 1 %
Brady Statistic RA Percent Paced: 99 %
Brady Statistic RV Percent Paced: 39 %
Date Time Interrogation Session: 20220106053059
Implantable Lead Implant Date: 20100309
Implantable Lead Implant Date: 20100309
Implantable Lead Location: 753859
Implantable Lead Location: 753860
Implantable Pulse Generator Implant Date: 20181219
Lead Channel Impedance Value: 410 Ohm
Lead Channel Impedance Value: 510 Ohm
Lead Channel Pacing Threshold Amplitude: 0.5 V
Lead Channel Pacing Threshold Amplitude: 0.75 V
Lead Channel Pacing Threshold Pulse Width: 0.4 ms
Lead Channel Pacing Threshold Pulse Width: 0.4 ms
Lead Channel Sensing Intrinsic Amplitude: 0.9 mV
Lead Channel Sensing Intrinsic Amplitude: 10 mV
Lead Channel Setting Pacing Amplitude: 1 V
Lead Channel Setting Pacing Amplitude: 2.5 V
Lead Channel Setting Pacing Pulse Width: 0.4 ms
Lead Channel Setting Sensing Sensitivity: 2 mV
Pulse Gen Model: 2272
Pulse Gen Serial Number: 8965776

## 2020-02-14 LAB — COMPREHENSIVE METABOLIC PANEL: Globulin: 3

## 2020-02-14 LAB — HEPATIC FUNCTION PANEL
ALT: 10 (ref 10–40)
AST: 13 — AB (ref 14–40)
Alkaline Phosphatase: 86 (ref 25–125)
Bilirubin, Direct: 0.1 (ref 0.01–0.4)
Bilirubin, Total: 3.4

## 2020-02-14 LAB — TSH: TSH: 0.6 (ref 0.41–5.90)

## 2020-02-17 ENCOUNTER — Non-Acute Institutional Stay (SKILLED_NURSING_FACILITY): Payer: Medicare PPO | Admitting: Internal Medicine

## 2020-02-17 DIAGNOSIS — I5040 Unspecified combined systolic (congestive) and diastolic (congestive) heart failure: Secondary | ICD-10-CM | POA: Diagnosis not present

## 2020-02-17 DIAGNOSIS — I472 Ventricular tachycardia, unspecified: Secondary | ICD-10-CM

## 2020-02-17 DIAGNOSIS — G2581 Restless legs syndrome: Secondary | ICD-10-CM

## 2020-02-17 DIAGNOSIS — E032 Hypothyroidism due to medicaments and other exogenous substances: Secondary | ICD-10-CM

## 2020-02-17 DIAGNOSIS — I255 Ischemic cardiomyopathy: Secondary | ICD-10-CM | POA: Diagnosis not present

## 2020-02-17 DIAGNOSIS — N319 Neuromuscular dysfunction of bladder, unspecified: Secondary | ICD-10-CM

## 2020-02-17 DIAGNOSIS — I350 Nonrheumatic aortic (valve) stenosis: Secondary | ICD-10-CM

## 2020-02-17 DIAGNOSIS — K5909 Other constipation: Secondary | ICD-10-CM

## 2020-02-17 DIAGNOSIS — I951 Orthostatic hypotension: Secondary | ICD-10-CM

## 2020-02-17 NOTE — Progress Notes (Signed)
Location:  [facility] Room Number: 29-A Place of Service:  SNF [id number]) Provider:  [name], MD  Patient Care Team: [name], MD as PCP - General (Internal Medicine) [name], MD as PCP - Cardiology (Cardiology) [name], MD as Attending Physician (Cardiology) Vevelyn Royals, MD as Consulting Physician (Ophthalmology) Franchot Gallo, MD as Consulting Physician (Urology) Tanda Rockers, MD as Consulting Physician (Pulmonary Disease) Allyn Kenner, MD (Dermatology) Deliah Goody, PA-C as Physician Assistant (Physician Assistant) Mast, Man X, NP as Nurse Practitioner (Internal Medicine) Ngetich, Nelda Bucks, NP as Nurse Practitioner (Family Medicine)  Extended Emergency Contact Information Primary Emergency Contact: [name] of [place] Phone: [phone] Relation: Daughter Secondary Emergency Contact: Hayashida,[name] Address: [address]          [place] Phone: [phone] Mobile Phone: [phone] Relation: Spouse  Code Status:  DNR Goals of care: Advanced Directive information Advanced Directives 12/28/2019  Does Patient Have a Medical Advance Directive? Yes  Type of Advance Directive Living will;Healthcare Power of Attorney  Does patient want to make changes to medical advance directive? No - Patient declined  Copy of [place] in Chart? Yes - validated most recent copy scanned in chart (See row information)  Would patient like information on creating a medical advance directive? -  Pre-existing out of facility DNR order (yellow form or pink MOST form) Yellow form placed in chart (order not valid for inpatient use);Pink MOST form placed in chart (order not valid for inpatient use)     Chief Complaint  Patient presents with  . Medical Management of Chronic Issues    Routine [facility] SN[facility] visit    HPI:  Pt is a 87 y.o. male seen  today for medical management of chronic diseases.     He has h/o CAD s/p Bypass surgery with Extensive Inferior Wall Scarring,  Ischemic Cardiomyopathy withsystolic heart failureEF of 35-40%Moderate AS, History of ventricular tachycardia stable on amiodarone Sinus node dysfunction with dual-chamber PPP, Severe orthostatic hypotension neurogenic,history of compression fractures, history of ischemic stroke in the past on dual therapy.,History of chronic indwelling Foley catheter  Severe [facility]  Doing well. Weight is stable. Mild SOB on Exertion but now usually stays in his bed or wheelchair. Was c/o Some headaches but no Neurological complains. No Recent Falls No Nausea or vomiting Bowels are moving better. No Chest pain. No Vision issues Wife comes and visit everyday No Nursing issues     Past Medical History:  Diagnosis Date  . Arthritis    "minor, back and sometimes knees" (12/15/2012)  . Bradycardia    SSS s/p St Jude PPM 04/12/2008  . CAD (coronary artery disease) 12/30/2014   CABG (LIMA-LAD, SVG-RCA, SVG-OM in 1996).  07/2009 BMS to SVG-RCA. Cath in 04/2010 with patent stents   . Cardiomyopathy, ischemic 08/25/2012  . Chronic knee pain 12/03/2014  . Combined congestive systolic and diastolic heart failure ([place]) 02/02/2015   Hx EF 41%. BNP 96.8 02/21/15 Torsemide 04/06/15 Na 142, K 4.6, Bun 16, creat 0.89 04/20/14 BNP 111.7, Na 142, K 4.6, Bun 16, creat 0.9   . Depression with anxiety 02/02/2015   02/21/15 Hgb A1c 5.8 03/10/15 MMSE 30/30   . Dizziness, after diuretic asscoiated with hypotension and responded to fluid bolus 06/05/2011   04/28/15 US carotid R+L normal bilateral arterial velocities.    Marland Kitchen Dyspnea 08/11/2014   Followed in  Pulmonary clinic/ [facility]/ [facility]  - 08/11/2014  Walked RA x 1 laps @ 185 ft each stopped due to fatigue/off balance/ slow pace/  no sob or desat  - PFT's  09/26/2014  FEV1 2.26 (85 % ) ratio 76  p no % improvement from saba  with DLCO  67 % corrects to 93 % for alv volume      Since prev study 08/04/13 minimal change lung vol or dlco    . Embolic cerebral infarction ([place]) 12/06/2015  . GERD (gastroesophageal reflux disease)   . Gout 02/09/2015  . Hiatal hernia   . Hyperlipidemia   . Hypertension   . Hypothyroid   . Influenza A 03/10/2016  . Insomnia   . Melanoma of back ([place]) 1976  . Myocardial infarction [facility]) 1996; 2011   "both silent" (12/15/2012)  . Nonrheumatic aortic valve stenosis   . Orthostatic hypotension   . Osteoporosis, senile   . Pacemaker   . RBBB   . Restless leg 02/02/2015  . Right leg weakness 12/06/2015  . [facility] spotted fever   . S/P CABG x 4   . Sick sinus syndrome ([place]) 01/31/2014  . Sustained ventricular tachycardia ([place]) 07/27/2014  . Urinary retention 12/30/2014   has a cathrater   Past Surgical History:  Procedure Laterality Date  . CARDIAC CATHETERIZATION  04/2010   LIMA to LAD patent,SVG to OM patent,no in-stnet restenosis RCA  . CATARACT EXTRACTION W/ INTRAOCULAR LENS  IMPLANT, BILATERAL Bilateral 2012  . CORONARY ANGIOPLASTY WITH STENT PLACEMENT  07/2009   bare metal stent to SVG to the RCA  . CORONARY ARTERY BYPASS GRAFT  1996   LIMA to LAD,SVG to RCA & SVG to OM  . INSERT / REPLACE / REMOVE PACEMAKER  2010  . MELANOMA EXCISION  05/1974 X2   "taken off my back" (12/15/2012)  . NM MYOVIEW LTD  06/2011   low risk  . PPM GENERATOR CHANGEOUT N/A 01/22/2017   Procedure: PPM GENERATOR CHANGEOUT;  Surgeon: [name], MD;  Location: [facility] CV LAB;  Service: Cardiovascular;  Laterality: N/A;  . TONSILLECTOMY  1938  . TRANSURETHRAL RESECTION OF PROSTATE  1986  . US ECHOCARDIOGRAPHY  07/11/2009   EF 45-50%    Allergies  Allergen Reactions  . Altace [Ramipril] Cough  . Crestor [Rosuvastatin Calcium] Rash  . Penicillins Rash and Other (See Comments)    Has patient had a PCN reaction causing immediate rash, facial/tongue/throat swelling, SOB or lightheadedness  with hypotension: Yes Has patient had a PCN reaction causing severe rash involving mucus membranes or skin necrosis: No Has patient had a PCN reaction that required hospitalization No Has patient had a PCN reaction occurring within the last 10 years: No If all of the above answers are "NO", then may proceed with Cephalosporin use.   . Sulfa Antibiotics Rash  . Sulfamethoxazole-Trimethoprim Rash    Outpatient Encounter Medications as of 02/17/2020  Medication Sig  . acetaminophen (TYLENOL) 500 MG tablet Take 500 mg by mouth 3 (three) times daily.  . Amino Acids-Protein Hydrolys (FEEDING SUPPLEMENT, PRO-STAT SUGAR FREE 64,) LIQD Take 30 mLs by mouth daily.   Marland Kitchen amiodarone (PACERONE) 100 MG tablet Take 100 mg by mouth daily.   . clobetasol (TEMOVATE) 0.05 % external solution Apply 1 application topically 2 (two) times daily as needed. Apply to affected areas of scalp prn. Do not apply to face, groin or under arms.  . clopidogrel (PLAVIX) 75 MG tablet Take 75 mg by mouth daily.  Marland Kitchen  clotrimazole-betamethasone (LOTRISONE) cream Apply 1 application topically daily as needed. Apply to penis and scrotum  . Droxidopa 100 MG CAPS Take 1 capsule (100 mg total) by mouth in the morning, at noon, and at bedtime.  . DULoxetine (CYMBALTA) 20 MG capsule Take 20 mg by mouth daily. For anxiety,insomnia and back pain  . fluocinonide cream (LIDEX) [id number] % Apply 1 application topically 2 (two) times daily. Apply to itchy dry patches on scalpe  . guaiFENesin (ROBITUSSIN) 100 MG/5ML liquid Take 100 mg by mouth every 4 (four) hours as needed for cough.  . hydrocortisone (ANUSOL-HC) 2.5 % rectal cream Place 1 application rectally daily as needed.  . Hydrocortisone-Aloe 1 % CREA Apply topically 2 (two) times daily as needed.  Marland Kitchen ketoconazole (NIZORAL) 2 % cream Apply 1 application topically 2 (two) times daily as needed for irritation. Mix with Cutivate 0.05% cream to affected areas of button and sacral areas prn  .  levothyroxine (SYNTHROID, LEVOTHROID) 100 MCG tablet Take 100 mcg by mouth daily before breakfast.  . Melatonin 3 MG TABS Take 3 mg by mouth at bedtime.  . methocarbamol (ROBAXIN) 500 MG tablet Take 250 mg by mouth 2 (two) times daily as needed for muscle spasms.   . midodrine (PROAMATINE) 5 MG tablet Take 5 mg by mouth 3 (three) times daily with meals. Notify provider if BP is < 90/60.  Marland Kitchen mineral oil-hydrophilic petrolatum (AQUAPHOR) ointment Apply 1 application topically See admin instructions. Apply once qd scheduled and once prn  . morphine (ROXANOL) 20 MG/ML concentrated solution Take by mouth every 4 (four) hours as needed for severe pain. 100 mg/5 mL (20 mg/mL); amt: 5 mg/0.25 mL  . nystatin cream (MYCOSTATIN) Apply 1 application topically 2 (two) times daily. To face and reddened areas BID until healed.  Marland Kitchen oxybutynin (DITROPAN-XL) 5 MG 24 hr tablet Take 5 mg by mouth at bedtime.  . pantoprazole (PROTONIX) 40 MG tablet Take 40 mg by mouth daily.  Marland Kitchen Plecanatide (TRULANCE) 3 MG TABS Take 3 mg by mouth daily.  . polyethylene glycol (MIRALAX / GLYCOLAX) 17 g packet Take 17 g by mouth daily as needed for mild constipation. Take 1-2 capfuls in 8 oz of H2O  . potassium chloride SA (KLOR-CON) 20 MEQ tablet Take 20 mEq by mouth daily.  Marland Kitchen rOPINIRole (REQUIP) 0.25 MG tablet Take 0.5 mg by mouth 2 (two) times daily.  Marland Kitchen rOPINIRole (REQUIP) 0.25 MG tablet Take 0.25 mg by mouth daily as needed.  . sennosides-docusate sodium (SENOKOT-S) 8.6-50 MG tablet Take 2 tablets by mouth daily.  Marland Kitchen torsemide (DEMADEX) 10 MG tablet Take 20 mg by mouth daily.  Marland Kitchen triamcinolone cream (KENALOG) 0.5 % Apply 1 application topically 2 (two) times daily. Apply to bilateral buttocks skin breakdown areas until healed  . zinc oxide 20 % ointment Apply 1 application topically as needed for irritation. Apply to buttocks after each incontinent episode and as needed for redness.  . [DISCONTINUED] lactose free nutrition (BOOST PLUS)  LIQD Take 237 mLs by mouth 2 (two) times daily between meals.  . [DISCONTINUED] Nutritional Supplements (NUTRITIONAL SUPPLEMENT PO) Take 1 each by mouth daily. Magic Cup with lunch  . [DISCONTINUED] oxybutynin (DITROPAN-XL) 10 MG 24 hr tablet Take 1 tablet (10 mg total) by mouth at bedtime.   No facility-administered encounter medications on file as of 02/17/2020.    Review of Systems  Constitutional: Positive for activity change.  HENT: Negative.   Respiratory: Positive for shortness of breath.   Cardiovascular: Negative.  Gastrointestinal: Positive for constipation.  Genitourinary: Negative.   Musculoskeletal: Positive for arthralgias and gait problem.  Skin: Positive for wound.  Neurological: Positive for weakness.  Psychiatric/Behavioral: Negative.     Immunization History  Administered Date(s) Administered  . Influenza, High Dose Seasonal PF 11/01/2014, 11/13/2016, 11/19/2018, 11/19/2018  . Influenza,inj,Quad PF,6+ Mos 11/24/2017  . Influenza,inj,quad, With Preservative 11/17/2017  . Influenza-Unspecified 12/05/2013, 11/05/2014, 11/16/2015, 11/09/2019  . Moderna Sars-Covid-2 Vaccination 02/08/2019, 03/08/2019  . PPD Test 03/07/2014  . Pneumococcal Conjugate-13 10/05/2009  . Pneumococcal Polysaccharide-23 11/05/2005  . Pneumococcal-Unspecified 10/05/2009  . Tdap 08/02/2014  . Zoster 11/05/2005   Pertinent  Health Maintenance Due  Topic Date Due  . DEXA SCAN  12/14/2024  . INFLUENZA VACCINE  Completed  . PNA vac Low Risk Adult  Completed   Fall Risk  10/09/2017 10/02/2016 10/30/2015 09/05/2015 02/02/2015  Falls in the past year? No No No No No  Risk for fall due to : - - Impaired balance/gait - -   Functional Status Survey:    Vitals:   02/17/20 1553  BP: 117/76  Pulse: 70  Resp: 20  Temp: (!) 97.4 F (36.3 C)  TempSrc: Oral  SpO2: 95%  Weight: 156 lb (70.8 kg)  Height: 5\' 10"  (1.778 m)   Body mass index is 22.38 kg/m. Physical Exam Vitals reviewed.   Constitutional:      Appearance: Normal appearance.  HENT:     Head: Normocephalic.     Nose: Nose normal.     Mouth/Throat:     Mouth: Mucous membranes are moist.     Pharynx: Oropharynx is clear.  Eyes:     Pupils: Pupils are equal, round, and reactive to light.  Cardiovascular:     Rate and Rhythm: Normal rate.     Heart sounds: Murmur heard.    Pulmonary:     Effort: Pulmonary effort is normal.     Breath sounds: No rales.  Abdominal:     General: Abdomen is flat. Bowel sounds are normal.     Palpations: Abdomen is soft.  Musculoskeletal:        General: No swelling.     Cervical back: Neck supple.  Skin:    General: Skin is warm.  Neurological:     General: No focal deficit present.     Mental Status: He is alert and oriented to person, place, and time.  Psychiatric:        Mood and Affect: Mood normal.        Thought Content: Thought content normal.     Labs reviewed: Recent Labs    08/29/19 0233 08/30/19 0253 08/31/19 0205 09/01/19 0226 09/02/19 0514 09/03/19 0545 11/29/19 0000 12/20/19 0000 12/23/19 0000 12/28/19 0000  NA 137 135 137 138 138  --    < > 139 138 136*  K 3.7 3.1* 3.6 3.8 3.5  --    < > 3.5 4.6 4.0  CL 102 99 102 99 95*  --    < > 100 100 101  CO2 24 27 25 26 30   --    < > 31* 32* 29*  GLUCOSE 98 104* 114* 101* 112*  --   --   --   --   --   BUN 16 15 13 14 17   --    < > 14 20 18   CREATININE 0.77 0.72 0.65 0.65 0.88  --    < > 0.8 1.0 1.0  CALCIUM 8.1* 7.7* 8.4* 8.4* 8.5*  --    < >  8.4* 8.8 8.6*  MG 2.7*  --  2.3  --   --  2.5*  --   --   --   --   PHOS 2.1* 3.1 3.0  --   --  3.7  --   --   --   --    < > = values in this interval not displayed.   Recent Labs    08/27/19 0226 08/29/19 0233 11/29/19 0000 12/28/19 0000  AST 20 33 16 17  ALT 12 18 11 10   ALKPHOS 53 55 78 97  BILITOT 0.8 0.7  --   --   PROT 5.7* 6.1*  --   --   ALBUMIN 2.7* 2.6* 3.4* 3.2*   Recent Labs    08/26/19 0111 08/26/19 0127 08/31/19 0205  09/01/19 0226 09/03/19 0545 11/29/19 0000 12/20/19 0000 12/28/19 0000  WBC 17.2*   < > 8.9 8.0 7.8 6.9 8.0 7.4  NEUTROABS 13.2*  --   --   --   --   --   --  4,277.00  HGB 12.4*   < > 10.3* 10.2* 11.1* 11.6* 11.1* 11.2*  HCT 37.2*   < > 32.1* 31.7* 34.1* 35* 32* 34*  MCV 99.2   < > 98.5 97.5 96.1  --   --   --   PLT 303   < > 218 250 322 232 331 329   < > = values in this interval not displayed.   Lab Results  Component Value Date   TSH 0.60 12/16/2019   Lab Results  Component Value Date   HGBA1C 5.8 (H) 08/26/2019   Lab Results  Component Value Date   CHOL 84 10/18/2019   HDL 32 (A) 10/18/2019   LDLCALC 31 10/18/2019   TRIG 133 10/18/2019   CHOLHDL 2.6 05/05/2017    Significant Diagnostic Results in last 30 days:  CUP PACEART REMOTE DEVICE CHECK  Result Date: 02/11/2020 Scheduled remote reviewed. Normal device function.  Next remote 91 days. HB   Assessment/Plan Combined systolic and diastolic congestive heart failure, unspecified HF chronicity (HCC) Staying stable on Demadex Headache Not sure of etiology  Exam is good Will continue to follow Ventricular tachycardia ([place]) On AMiodarone Cardiomyopathy, ischemic Hospice care RLS (restless legs syndrome) Continue Requip Hypothyroidism due to medication TSH normal in 1/22 Chronic constipation Continue on Trulance Neurogenic bladder With Chronic Foley cathter and Ditropan ot help with Spasms Orthostatic hypotension On Droxidopa and Midodrine Aortic valve stenosis, nonrheumatic Hospice care per cardiology Depression on Cymbalta   Family/ staff Communication:   Labs/tests ordered:

## 2020-02-24 NOTE — Progress Notes (Signed)
Remote pacemaker transmission.   

## 2020-03-10 ENCOUNTER — Non-Acute Institutional Stay (SKILLED_NURSING_FACILITY): Payer: Medicare PPO | Admitting: Nurse Practitioner

## 2020-03-10 ENCOUNTER — Encounter: Payer: Self-pay | Admitting: Nurse Practitioner

## 2020-03-10 DIAGNOSIS — E032 Hypothyroidism due to medicaments and other exogenous substances: Secondary | ICD-10-CM

## 2020-03-10 DIAGNOSIS — E876 Hypokalemia: Secondary | ICD-10-CM

## 2020-03-10 DIAGNOSIS — L89312 Pressure ulcer of right buttock, stage 2: Secondary | ICD-10-CM | POA: Insufficient documentation

## 2020-03-10 DIAGNOSIS — I2510 Atherosclerotic heart disease of native coronary artery without angina pectoris: Secondary | ICD-10-CM

## 2020-03-10 DIAGNOSIS — N319 Neuromuscular dysfunction of bladder, unspecified: Secondary | ICD-10-CM

## 2020-03-10 DIAGNOSIS — I495 Sick sinus syndrome: Secondary | ICD-10-CM

## 2020-03-10 DIAGNOSIS — K5909 Other constipation: Secondary | ICD-10-CM | POA: Diagnosis not present

## 2020-03-10 DIAGNOSIS — D500 Iron deficiency anemia secondary to blood loss (chronic): Secondary | ICD-10-CM

## 2020-03-10 DIAGNOSIS — I5040 Unspecified combined systolic (congestive) and diastolic (congestive) heart failure: Secondary | ICD-10-CM

## 2020-03-10 DIAGNOSIS — G903 Multi-system degeneration of the autonomic nervous system: Secondary | ICD-10-CM

## 2020-03-10 DIAGNOSIS — G2581 Restless legs syndrome: Secondary | ICD-10-CM

## 2020-03-10 DIAGNOSIS — M159 Polyosteoarthritis, unspecified: Secondary | ICD-10-CM

## 2020-03-10 DIAGNOSIS — K219 Gastro-esophageal reflux disease without esophagitis: Secondary | ICD-10-CM

## 2020-03-10 NOTE — Assessment & Plan Note (Signed)
s/p 3 units PRBC transfusion, Hgb 11.2 12/28/19

## 2020-03-10 NOTE — Assessment & Plan Note (Signed)
takes Levothyroxine.TSH0.60 02/14/20

## 2020-03-10 NOTE — Progress Notes (Signed)
Location:   SNF [place] Room Number: 40 Place of Service:  SNF (31) Provider: Lennie Odor Krystalle Pilkington NP  [name], MD  Patient Care Team: [name], MD as PCP - General (Internal Medicine) [name], MD as PCP - Cardiology (Cardiology) [name], MD as Attending Physician (Cardiology) Vevelyn Royals, MD as Consulting Physician (Ophthalmology) Franchot Gallo, MD as Consulting Physician (Urology) Tanda Rockers, MD as Consulting Physician (Pulmonary Disease) Allyn Kenner, MD (Dermatology) Deliah Goody, PA-C as Physician Assistant (Physician Assistant) Xabi Wittler X, NP as Nurse Practitioner (Internal Medicine) Ngetich, Nelda Bucks, NP as Nurse Practitioner (Family Medicine)  Extended Emergency Contact Information Primary Emergency Contact: [name] of [place] Phone: [phone] Relation: Daughter Secondary Emergency Contact: Barbaro,[name] Address: [address]          [place] Phone: [phone] Mobile Phone: [phone] Relation: Spouse  Code Status:  DNR Goals of care: Advanced Directive information Advanced Directives 02/17/2020  Does Patient Have a Medical Advance Directive? Yes  Type of Paramedic of [place];Out of facility DNR (pink MOST or yellow form);Living will  Does patient want to make changes to medical advance directive? No - Patient declined  Copy of [place] in Chart? Yes - validated most recent copy scanned in chart (See row information)  Would patient like information on creating a medical advance directive? -  Pre-existing out of facility DNR order (yellow form or pink MOST form) Yellow form placed in chart (order not valid for inpatient use)     Chief Complaint  Patient presents with  . Acute Visit    Pressure ulcers on the right buttock.    HPI:  Pt is a 87 y.o. male seen today for an acute visit for  constipation, noted open areas on the right buttock. The patient is bed ridden comfort measure care resident, chronic Foley for urinary retention. The patient denied abd pain, nausea, vomiting.   Hypokalemia, K4.0 12/28/19, taking Kcl             Hx of Afib/SSS s/p St Jude PPM 2010, ventricular tachycardia, takes Amiodarone             Depression, takes Duloxetine 20mg  qd             CAD, CABG x4, stents,off ASA due to hematuria.takes Plavix             CHF, takes Torsemide, EF 35-40%,Bun/creat 18/1.0 12/28/19             OA knee pain, left lower back, takesTylenol, Robaxin.              GERD takes Pantoprazole             Hypothyroidism, takes Levothyroxine.TSH0.60 02/14/20   Orthostatic hypotension, neurogenic,tales Droxidopa, Midodrine.    RLS, takes Ropinirole              Constipation, takes Trulance, Senokot S, MiraLaxprn                                    Urinary retention, chronic indwelling cath, takes Oxybutynin.             Blood loss anemia, s/p 3 units PRBC transfusion, Hgb 11.2 12/28/19   Past Medical History:  Diagnosis Date  . Arthritis    "minor, back and sometimes knees" (12/15/2012)  .  Bradycardia    SSS s/p St Jude PPM 04/12/2008  . CAD (coronary artery disease) 12/30/2014   CABG (LIMA-LAD, SVG-RCA, SVG-OM in 1996).  07/2009 BMS to SVG-RCA. Cath in 04/2010 with patent stents   . Cardiomyopathy, ischemic 08/25/2012  . Chronic knee pain 12/03/2014  . Combined congestive systolic and diastolic heart failure ([place]) 02/02/2015   Hx EF 41%. BNP 96.8 02/21/15 Torsemide 04/06/15 Na 142, K 4.6, Bun 16, creat 0.89 04/20/14 BNP 111.7, Na 142, K 4.6, Bun 16, creat 0.9   . Depression with anxiety 02/02/2015   02/21/15 Hgb A1c 5.8 03/10/15 MMSE 30/30   . Dizziness, after diuretic asscoiated with hypotension and responded to fluid bolus 06/05/2011   04/28/15 US carotid R+L normal bilateral arterial velocities.    Marland Kitchen Dyspnea 08/11/2014   Followed in Pulmonary clinic/ [facility]/  [place]  - 08/11/2014  Walked RA x 1 laps @ 185 ft each stopped due to fatigue/off balance/ slow pace/  no sob or desat  - PFT's  09/26/2014  FEV1 2.26 (85 % ) ratio 76  p no % improvement from saba with DLCO  67 % corrects to 93 % for alv volume      Since prev study 08/04/13 minimal change lung vol or dlco    . Embolic cerebral infarction ([place]) 12/06/2015  . GERD (gastroesophageal reflux disease)   . Gout 02/09/2015  . Hiatal hernia   . Hyperlipidemia   . Hypertension   . Hypothyroid   . Influenza A 03/10/2016  . Insomnia   . Melanoma of back ([place]) 1976  . Myocardial infarction [facility]) 1996; 2011   "both silent" (12/15/2012)  . Nonrheumatic aortic valve stenosis   . Orthostatic hypotension   . Osteoporosis, senile   . Pacemaker   . RBBB   . Restless leg 02/02/2015  . Right leg weakness 12/06/2015  . [facility] spotted fever   . S/P CABG x 4   . Sick sinus syndrome ([place]) 01/31/2014  . Sustained ventricular tachycardia ([place]) 07/27/2014  . Urinary retention 12/30/2014   has a cathrater   Past Surgical History:  Procedure Laterality Date  . CARDIAC CATHETERIZATION  04/2010   LIMA to LAD patent,SVG to OM patent,no in-stnet restenosis RCA  . CATARACT EXTRACTION W/ INTRAOCULAR LENS  IMPLANT, BILATERAL Bilateral 2012  . CORONARY ANGIOPLASTY WITH STENT PLACEMENT  07/2009   bare metal stent to SVG to the RCA  . CORONARY ARTERY BYPASS GRAFT  1996   LIMA to LAD,SVG to RCA & SVG to OM  . INSERT / REPLACE / REMOVE PACEMAKER  2010  . MELANOMA EXCISION  05/1974 X2   "taken off my back" (12/15/2012)  . NM MYOVIEW LTD  06/2011   low risk  . PPM GENERATOR CHANGEOUT N/A 01/22/2017   Procedure: PPM GENERATOR CHANGEOUT;  Surgeon: [name], MD;  Location: [facility];  Service: Cardiovascular;  Laterality: N/A;  . TONSILLECTOMY  1938  . TRANSURETHRAL RESECTION OF PROSTATE  1986  . US ECHOCARDIOGRAPHY  07/11/2009   EF 45-50%    Allergies  Allergen Reactions  . Altace [Ramipril] Cough  .  Crestor [Rosuvastatin Calcium] Rash  . Penicillins Rash and Other (See Comments)    Has patient had a PCN reaction causing immediate rash, facial/tongue/throat swelling, SOB or lightheadedness with hypotension: Yes Has patient had a PCN reaction causing severe rash involving mucus membranes or skin necrosis: No Has patient had a PCN reaction that required hospitalization No Has patient had a PCN reaction  occurring within the last 10 years: No If all of the above answers are "NO", then may proceed with Cephalosporin use.   . Sulfa Antibiotics Rash  . Sulfamethoxazole-Trimethoprim Rash    Allergies as of 03/10/2020      Reactions   Altace [ramipril] Cough   Crestor [rosuvastatin Calcium] Rash   Penicillins Rash, Other (See Comments)   Has patient had a PCN reaction causing immediate rash, facial/tongue/throat swelling, SOB or lightheadedness with hypotension: Yes Has patient had a PCN reaction causing severe rash involving mucus membranes or skin necrosis: No Has patient had a PCN reaction that required hospitalization No Has patient had a PCN reaction occurring within the last 10 years: No If all of the above answers are "NO", then may proceed with Cephalosporin use.   Sulfa Antibiotics Rash   Sulfamethoxazole-trimethoprim Rash      Medication List       Accurate as of March 10, 2020 11:59 PM. If you have any questions, ask your nurse or doctor.        STOP taking these medications   Plecanatide 3 MG Tabs Commonly known as: Trulance Stopped by: Maple Odaniel X Ranny Wiebelhaus, NP     TAKE these medications   acetaminophen 500 MG tablet Commonly known as: TYLENOL Take 500 mg by mouth 3 (three) times daily.   amiodarone 100 MG tablet Commonly known as: PACERONE Take 100 mg by mouth daily.   bisacodyl 10 MG suppository Commonly known as: DULCOLAX Place 10 mg rectally as needed for moderate constipation.   clobetasol 0.05 % external solution Commonly known as: TEMOVATE Apply 1 application  topically 2 (two) times daily as needed. Apply to affected areas of scalp prn. Do not apply to face, groin or under arms.   clopidogrel 75 MG tablet Commonly known as: PLAVIX Take 75 mg by mouth daily.   clotrimazole-betamethasone cream Commonly known as: LOTRISONE Apply 1 application topically daily as needed. Apply to penis and scrotum   Droxidopa 100 MG Caps Take 1 capsule (100 mg total) by mouth in the morning, at noon, and at bedtime.   DULoxetine 20 MG capsule Commonly known as: CYMBALTA Take 20 mg by mouth daily. For anxiety,insomnia and back pain   feeding supplement (PRO-STAT SUGAR FREE 64) Liqd Take 30 mLs by mouth daily.   fluocinonide cream 0.05 % Commonly known as: LIDEX Apply 1 application topically 2 (two) times daily. Apply to itchy dry patches on scalpe   guaiFENesin 100 MG/5ML liquid Commonly known as: ROBITUSSIN Take 100 mg by mouth every 4 (four) hours as needed for cough.   hydrocortisone 2.5 % rectal cream Commonly known as: ANUSOL-HC Place 1 application rectally daily as needed.   Hydrocortisone-Aloe 1 % Crea Apply topically 2 (two) times daily as needed.   ketoconazole 2 % cream Commonly known as: NIZORAL Apply 1 application topically 2 (two) times daily as needed for irritation. Mix with Cutivate 0.05% cream to affected areas of button and sacral areas prn   levothyroxine 100 MCG tablet Commonly known as: SYNTHROID Take 100 mcg by mouth daily before breakfast.   linaclotide 72 MCG capsule Commonly known as: LINZESS Take 72 mcg by mouth daily before breakfast.   magnesium hydroxide 400 MG/5ML suspension Commonly known as: MILK OF MAGNESIA Take 30 mLs by mouth daily as needed for mild constipation.   melatonin 3 MG Tabs tablet Take 3 mg by mouth at bedtime.   methocarbamol 500 MG tablet Commonly known as: ROBAXIN Take 250 mg by mouth 2 (  two) times daily as needed for muscle spasms.   midodrine 5 MG tablet Commonly known as:  PROAMATINE Take 5 mg by mouth 3 (three) times daily with meals. Notify provider if BP is < 90/60.   mineral oil-hydrophilic petrolatum ointment Apply 1 application topically See admin instructions. Apply once qd scheduled and once prn   morphine 20 MG/ML concentrated solution Commonly known as: ROXANOL Take by mouth every 4 (four) hours as needed for severe pain. 100 mg/5 mL (20 mg/mL); amt: 5 mg/0.25 mL   nystatin cream Commonly known as: MYCOSTATIN Apply 1 application topically 2 (two) times daily. To face and reddened areas BID until healed.   oxybutynin 5 MG 24 hr tablet Commonly known as: DITROPAN-XL Take 5 mg by mouth at bedtime.   pantoprazole 40 MG tablet Commonly known as: PROTONIX Take 40 mg by mouth daily.   polyethylene glycol 17 g packet Commonly known as: MIRALAX / GLYCOLAX Take 17 g by mouth daily as needed for mild constipation. Take 1-2 capfuls in 8 oz of H2O   potassium chloride SA 20 MEQ tablet Commonly known as: KLOR-CON Take 20 mEq by mouth daily.   rOPINIRole 0.25 MG tablet Commonly known as: REQUIP Take 0.5 mg by mouth 2 (two) times daily.   rOPINIRole 0.25 MG tablet Commonly known as: REQUIP Take 0.25 mg by mouth daily as needed.   sennosides-docusate sodium 8.6-50 MG tablet Commonly known as: SENOKOT-S Take 2 tablets by mouth daily.   torsemide 10 MG tablet Commonly known as: DEMADEX Take 20 mg by mouth daily.   triamcinolone cream 0.5 % Commonly known as: KENALOG Apply 1 application topically 2 (two) times daily. Apply to bilateral buttocks skin breakdown areas until healed   zinc oxide 20 % ointment Apply 1 application topically as needed for irritation. Apply to buttocks after each incontinent episode and as needed for redness.       Review of Systems  Constitutional: Negative for activity change, appetite change and fever.  HENT: Positive for hearing loss. Negative for congestion and voice change.   Eyes: Negative for visual  disturbance.  Respiratory: Positive for cough. Negative for chest tightness, shortness of breath and wheezing.        Chronic DOE, cough  Cardiovascular: Negative for leg swelling.  Gastrointestinal: Positive for constipation. Negative for abdominal pain.  Genitourinary: Positive for difficulty urinating. Negative for dysuria and hematuria.       Foley  Musculoskeletal: Positive for arthralgias, back pain and gait problem.       Right shoulder limited over head ROM with pain.  Positional lower back pain is chronic, chronic knee pain  Skin: Positive for rash and wound. Negative for color change.  Neurological: Negative for speech difficulty, weakness and light-headedness.       Memory lapses. Mild expressive aphasia.   Psychiatric/Behavioral: Negative for behavioral problems and sleep disturbance. The patient is not nervous/anxious.     Immunization History  Administered Date(s) Administered  . Influenza, High Dose Seasonal PF 11/01/2014, 11/13/2016, 11/19/2018, 11/19/2018  . Influenza,inj,Quad PF,6+ Mos 11/24/2017  . Influenza,inj,quad, With Preservative 11/17/2017  . Influenza-Unspecified 12/05/2013, 11/05/2014, 11/16/2015, 11/09/2019  . Moderna Sars-Covid-2 Vaccination 02/08/2019, 03/08/2019, 01/07/2020  . PPD Test 03/07/2014  . Pneumococcal Conjugate-13 10/05/2009  . Pneumococcal Polysaccharide-23 11/05/2005  . Pneumococcal-Unspecified 10/05/2009  . Tdap 08/02/2014  . Zoster 11/05/2005   Pertinent  Health Maintenance Due  Topic Date Due  . DEXA SCAN  12/14/2024  . INFLUENZA VACCINE  Completed  . PNA vac  Low Risk Adult  Completed   Fall Risk  10/09/2017 10/02/2016 10/30/2015 09/05/2015 02/02/2015  Falls in the past year? No No No No No  Risk for fall due to : - - Impaired balance/gait - -   Functional Status Survey:    Vitals:   03/10/20 1525  BP: 125/75  Pulse: 69  Resp: 18  Temp: 97.6 F (36.4 C)  SpO2: 95%  Weight: 153 lb 9.6 oz (69.7 kg)  Height: 5\' 10"  (1.778 m)    Body mass index is 22.04 kg/m. Physical Exam Vitals and nursing note reviewed.  Constitutional:      Appearance: Normal appearance.  HENT:     Head: Normocephalic and atraumatic.     Nose: Nose normal.     Mouth/Throat:     Mouth: Mucous membranes are moist.  Eyes:     Extraocular Movements: Extraocular movements intact.     Conjunctiva/sclera: Conjunctivae normal.     Pupils: Pupils are equal, round, and reactive to light.  Cardiovascular:     Rate and Rhythm: Normal rate and regular rhythm.     Heart sounds: Murmur heard.      Comments: Left upper chest pace maker.  Pulmonary:     Breath sounds: No rales.     Comments: Bibasilar rales.  Abdominal:     General: Bowel sounds are normal.     Tenderness: There is no abdominal tenderness.     Comments: BS present  Genitourinary:    Comments: Foley, dark blood urine in Foley catheter bag Musculoskeletal:     Cervical back: Normal range of motion and neck supple.     Right lower leg: No edema.     Left lower leg: No edema.     Comments: Reduced over head ROM of the right shoulder.  Positional lower back pain remains no change.   Skin:    General: Skin is warm and dry.     Comments: excoriated superficial open areas with nonblanchable base in the sacral/R+L left buttocks. 2 open stage 2 pressure ulcer about a dime sized at the right buttock, no s/s of infection.   Neurological:     General: No focal deficit present.     Mental Status: He is alert. Mental status is at baseline.     Gait: Gait abnormal.     Comments: Oriented to person, place.   Psychiatric:        Mood and Affect: Mood normal.        Behavior: Behavior normal.        Thought Content: Thought content normal.     Labs reviewed: Recent Labs    08/29/19 0233 08/30/19 0253 08/31/19 0205 09/01/19 0226 09/02/19 0514 09/03/19 0545 11/29/19 0000 12/20/19 0000 12/23/19 0000 12/28/19 0000  NA 137 135 137 138 138  --    < > 139 138 136*  K 3.7 3.1* 3.6  3.8 3.5  --    < > 3.5 4.6 4.0  CL 102 99 102 99 95*  --    < > 100 100 101  CO2 24 27 25 26 30   --    < > 31* 32* 29*  GLUCOSE 98 104* 114* 101* 112*  --   --   --   --   --   BUN 16 15 13 14 17   --    < > 14 20 18   CREATININE 0.77 0.72 0.65 0.65 0.88  --    < > 0.8 1.0 1.0  CALCIUM 8.1* 7.7* 8.4* 8.4* 8.5*  --    < > 8.4* 8.8 8.6*  MG 2.7*  --  2.3  --   --  2.5*  --   --   --   --   PHOS 2.1* 3.1 3.0  --   --  3.7  --   --   --   --    < > = values in this interval not displayed.   Recent Labs    08/27/19 0226 08/29/19 0233 11/29/19 0000 12/28/19 0000 02/14/20 0000  AST 20 33 16 17 13*  ALT 12 18 11 10 10   ALKPHOS 53 55 78 97 86  BILITOT 0.8 0.7  --   --   --   PROT 5.7* 6.1*  --   --   --   ALBUMIN 2.7* 2.6* 3.4* 3.2*  --    Recent Labs    08/26/19 0111 08/26/19 0127 08/31/19 0205 09/01/19 0226 09/03/19 0545 11/29/19 0000 12/20/19 0000 12/28/19 0000  WBC 17.2*   < > 8.9 8.0 7.8 6.9 8.0 7.4  NEUTROABS 13.2*  --   --   --   --   --   --  4,277.00  HGB 12.4*   < > 10.3* 10.2* 11.1* 11.6* 11.1* 11.2*  HCT 37.2*   < > 32.1* 31.7* 34.1* 35* 32* 34*  MCV 99.2   < > 98.5 97.5 96.1  --   --   --   PLT 303   < > 218 250 322 232 331 329   < > = values in this interval not displayed.   Lab Results  Component Value Date   TSH 0.60 02/14/2020   Lab Results  Component Value Date   HGBA1C 5.8 (H) 08/26/2019   Lab Results  Component Value Date   CHOL 84 10/18/2019   HDL 32 (A) 10/18/2019   LDLCALC 31 10/18/2019   TRIG 133 10/18/2019   CHOLHDL 2.6 05/05/2017    Significant Diagnostic Results in last 30 days:  No results found.  Assessment/Plan Pressure ulcer of right buttock, stage 2 (HCC) 2 dime sized stage 2 pressure ulcers right buttock, will apply Hydrocolloid dressing q 3days/prn, assist the patient with frequent repositioning to reduce pressures to the areas.   Chronic constipation Constipated, continue Trulance, increase Senokot S III qhs, MiraLaxprn                            Neurogenic bladder  chronic indwelling cath, takes Oxybutynin.   Blood loss anemia s/p 3 units PRBC transfusion, Hgb 11.2 12/28/19  RLS (restless legs syndrome) takes Ropinirole   Neurogenic orthostatic hypotension (HCC) tales Droxidopa, Midodrine.     Hypothyroidism takes Levothyroxine.TSH0.60 02/14/20    GERD (gastroesophageal reflux disease)  takes Pantoprazole   Osteoarthritis OA knee pain, left lower back, takesTylenol, Robaxin.    Combined congestive systolic and diastolic heart failure (HCC) takes Torsemide, EF 35-40%,Bun/creat 18/1.0 12/28/19   Coronary artery disease involving native coronary artery of native heart without angina pectoris CABG x4, stents,off ASA due to hematuria.takes Plavix   SSS (sick sinus syndrome) ([place]) Hx of Afib/SSS s/p St Jude PPM 2010, ventricular tachycardia, takes Amiodarone   Hypokalemia Hypokalemia, K4.0 12/28/19, taking Kcl     Family/ staff Communication: plan of care reviewed with the patient and charge nurse.   Labs/tests ordered: none  Time spend 25 minutes.

## 2020-03-10 NOTE — Assessment & Plan Note (Signed)
takes Pantoprazole 

## 2020-03-10 NOTE — Assessment & Plan Note (Signed)
takes Torsemide, EF 35-40%,Bun/creat 18/1.0 12/28/19

## 2020-03-10 NOTE — Assessment & Plan Note (Signed)
CABG x4, stents,off ASA due to hematuria.takes Plavix

## 2020-03-10 NOTE — Assessment & Plan Note (Signed)
chronic indwelling cath, takes Oxybutynin.   

## 2020-03-10 NOTE — Assessment & Plan Note (Signed)
2 dime sized stage 2 pressure ulcers right buttock, will apply Hydrocolloid dressing q 3days/prn, assist the patient with frequent repositioning to reduce pressures to the areas.

## 2020-03-10 NOTE — Assessment & Plan Note (Signed)
Constipated, continue Trulance, increase Senokot S III qhs, MiraLaxprn

## 2020-03-10 NOTE — Assessment & Plan Note (Signed)
Hypokalemia, K4.0 12/28/19, taking Kcl

## 2020-03-10 NOTE — Assessment & Plan Note (Signed)
OA knee pain, left lower back, takes Tylenol, Robaxin.   

## 2020-03-10 NOTE — Assessment & Plan Note (Signed)
Hx of Afib/SSS s/p St Jude PPM 2010, ventricular tachycardia, takes Amiodarone 

## 2020-03-10 NOTE — Assessment & Plan Note (Signed)
takes Ropinirole

## 2020-03-10 NOTE — Assessment & Plan Note (Signed)
tales Droxidopa, Midodrine.   

## 2020-03-14 ENCOUNTER — Encounter: Payer: Self-pay | Admitting: Nurse Practitioner

## 2020-04-04 DEATH — deceased
